# Patient Record
Sex: Female | Born: 1945 | Race: White | Hispanic: No | Marital: Married | Smoking: Never smoker
Health system: Southern US, Community
[De-identification: ages and names within clinical notes are randomized; demographics above are authoritative.]

## PROBLEM LIST (undated history)

## (undated) DIAGNOSIS — Z8041 Family history of malignant neoplasm of ovary: Secondary | ICD-10-CM

## (undated) DIAGNOSIS — M5136 Other intervertebral disc degeneration, lumbar region: Secondary | ICD-10-CM

## (undated) DIAGNOSIS — D649 Anemia, unspecified: Secondary | ICD-10-CM

## (undated) DIAGNOSIS — E119 Type 2 diabetes mellitus without complications: Secondary | ICD-10-CM

## (undated) DIAGNOSIS — L0390 Cellulitis, unspecified: Secondary | ICD-10-CM

## (undated) DIAGNOSIS — Z800 Family history of malignant neoplasm of digestive organs: Secondary | ICD-10-CM

## (undated) DIAGNOSIS — M1990 Unspecified osteoarthritis, unspecified site: Secondary | ICD-10-CM

## (undated) DIAGNOSIS — G629 Polyneuropathy, unspecified: Secondary | ICD-10-CM

## (undated) DIAGNOSIS — C801 Malignant (primary) neoplasm, unspecified: Secondary | ICD-10-CM

## (undated) DIAGNOSIS — K529 Noninfective gastroenteritis and colitis, unspecified: Secondary | ICD-10-CM

## (undated) DIAGNOSIS — K219 Gastro-esophageal reflux disease without esophagitis: Secondary | ICD-10-CM

## (undated) DIAGNOSIS — G4733 Obstructive sleep apnea (adult) (pediatric): Secondary | ICD-10-CM

## (undated) DIAGNOSIS — I872 Venous insufficiency (chronic) (peripheral): Secondary | ICD-10-CM

## (undated) DIAGNOSIS — Z9981 Dependence on supplemental oxygen: Secondary | ICD-10-CM

## (undated) DIAGNOSIS — I10 Essential (primary) hypertension: Secondary | ICD-10-CM

## (undated) DIAGNOSIS — Z803 Family history of malignant neoplasm of breast: Secondary | ICD-10-CM

## (undated) DIAGNOSIS — I5030 Unspecified diastolic (congestive) heart failure: Secondary | ICD-10-CM

## (undated) DIAGNOSIS — R0602 Shortness of breath: Secondary | ICD-10-CM

## (undated) DIAGNOSIS — H9190 Unspecified hearing loss, unspecified ear: Secondary | ICD-10-CM

## (undated) DIAGNOSIS — M51369 Other intervertebral disc degeneration, lumbar region without mention of lumbar back pain or lower extremity pain: Secondary | ICD-10-CM

## (undated) DIAGNOSIS — G2581 Restless legs syndrome: Secondary | ICD-10-CM

## (undated) DIAGNOSIS — E785 Hyperlipidemia, unspecified: Secondary | ICD-10-CM

## (undated) HISTORY — DX: Venous insufficiency (chronic) (peripheral): I87.2

## (undated) HISTORY — DX: Hyperlipidemia, unspecified: E78.5

## (undated) HISTORY — DX: Family history of malignant neoplasm of ovary: Z80.41

## (undated) HISTORY — DX: Unspecified osteoarthritis, unspecified site: M19.90

## (undated) HISTORY — DX: Family history of malignant neoplasm of breast: Z80.3

## (undated) HISTORY — DX: Unspecified diastolic (congestive) heart failure: I50.30

## (undated) HISTORY — DX: Restless legs syndrome: G25.81

## (undated) HISTORY — DX: Type 2 diabetes mellitus without complications: E11.9

## (undated) HISTORY — DX: Essential (primary) hypertension: I10

## (undated) HISTORY — DX: Obstructive sleep apnea (adult) (pediatric): G47.33

## (undated) HISTORY — DX: Family history of malignant neoplasm of digestive organs: Z80.0

## (undated) HISTORY — DX: Noninfective gastroenteritis and colitis, unspecified: K52.9

---

## 1968-02-22 HISTORY — PX: TONSILLECTOMY: SUR1361

## 1978-02-21 HISTORY — PX: TUBAL LIGATION: SHX77

## 1993-01-20 ENCOUNTER — Encounter: Payer: Self-pay | Admitting: Internal Medicine

## 1993-02-21 HISTORY — PX: UMBILICAL HERNIA REPAIR: SHX196

## 1995-02-22 HISTORY — PX: CHOLECYSTECTOMY: SHX55

## 1997-09-18 ENCOUNTER — Other Ambulatory Visit: Admission: RE | Admit: 1997-09-18 | Discharge: 1997-09-18 | Payer: Self-pay | Admitting: Internal Medicine

## 2001-12-18 ENCOUNTER — Encounter: Payer: Self-pay | Admitting: Internal Medicine

## 2002-11-11 ENCOUNTER — Other Ambulatory Visit: Admission: RE | Admit: 2002-11-11 | Discharge: 2002-11-11 | Payer: Self-pay | Admitting: Family Medicine

## 2002-11-13 ENCOUNTER — Encounter: Admission: RE | Admit: 2002-11-13 | Discharge: 2002-11-13 | Payer: Self-pay

## 2002-11-20 ENCOUNTER — Encounter: Admission: RE | Admit: 2002-11-20 | Discharge: 2002-11-20 | Payer: Self-pay

## 2003-01-06 ENCOUNTER — Encounter: Admission: RE | Admit: 2003-01-06 | Discharge: 2003-01-06 | Payer: Self-pay

## 2003-01-06 ENCOUNTER — Encounter: Payer: Self-pay | Admitting: Internal Medicine

## 2003-01-18 ENCOUNTER — Emergency Department (HOSPITAL_COMMUNITY): Admission: AD | Admit: 2003-01-18 | Discharge: 2003-01-18 | Payer: Self-pay | Admitting: Family Medicine

## 2003-11-14 ENCOUNTER — Encounter: Payer: Self-pay | Admitting: Internal Medicine

## 2003-11-20 ENCOUNTER — Other Ambulatory Visit: Admission: RE | Admit: 2003-11-20 | Discharge: 2003-11-20 | Payer: Self-pay | Admitting: Family Medicine

## 2003-12-18 ENCOUNTER — Encounter: Payer: Self-pay | Admitting: Internal Medicine

## 2004-01-03 ENCOUNTER — Emergency Department (HOSPITAL_COMMUNITY): Admission: EM | Admit: 2004-01-03 | Discharge: 2004-01-03 | Payer: Self-pay | Admitting: Family Medicine

## 2004-01-08 ENCOUNTER — Encounter: Admission: RE | Admit: 2004-01-08 | Discharge: 2004-01-08 | Payer: Self-pay | Admitting: Family Medicine

## 2004-04-12 ENCOUNTER — Emergency Department (HOSPITAL_COMMUNITY): Admission: EM | Admit: 2004-04-12 | Discharge: 2004-04-12 | Payer: Self-pay | Admitting: Family Medicine

## 2004-09-10 ENCOUNTER — Encounter: Admission: RE | Admit: 2004-09-10 | Discharge: 2004-09-10 | Payer: Self-pay | Admitting: Family Medicine

## 2004-12-02 ENCOUNTER — Other Ambulatory Visit: Admission: RE | Admit: 2004-12-02 | Discharge: 2004-12-02 | Payer: Self-pay | Admitting: Family Medicine

## 2005-03-18 ENCOUNTER — Encounter: Admission: RE | Admit: 2005-03-18 | Discharge: 2005-03-18 | Payer: Self-pay | Admitting: Family Medicine

## 2006-01-30 ENCOUNTER — Encounter: Admission: RE | Admit: 2006-01-30 | Discharge: 2006-01-30 | Payer: Self-pay | Admitting: Family Medicine

## 2006-02-21 HISTORY — PX: OTHER SURGICAL HISTORY: SHX169

## 2006-03-06 ENCOUNTER — Encounter (INDEPENDENT_AMBULATORY_CARE_PROVIDER_SITE_OTHER): Payer: Self-pay | Admitting: Specialist

## 2006-03-06 ENCOUNTER — Ambulatory Visit (HOSPITAL_COMMUNITY): Admission: RE | Admit: 2006-03-06 | Discharge: 2006-03-06 | Payer: Self-pay | Admitting: *Deleted

## 2006-07-19 ENCOUNTER — Encounter: Admission: RE | Admit: 2006-07-19 | Discharge: 2006-07-19 | Payer: Self-pay | Admitting: Family Medicine

## 2007-03-12 ENCOUNTER — Encounter: Payer: Self-pay | Admitting: Internal Medicine

## 2007-03-27 ENCOUNTER — Encounter: Admission: RE | Admit: 2007-03-27 | Discharge: 2007-03-27 | Payer: Self-pay | Admitting: Family Medicine

## 2007-07-23 ENCOUNTER — Encounter: Admission: RE | Admit: 2007-07-23 | Discharge: 2007-07-23 | Payer: Self-pay | Admitting: Obstetrics & Gynecology

## 2007-07-23 LAB — HM MAMMOGRAPHY: HM Mammogram: NEGATIVE

## 2008-01-13 ENCOUNTER — Inpatient Hospital Stay (HOSPITAL_COMMUNITY): Admission: EM | Admit: 2008-01-13 | Discharge: 2008-01-20 | Payer: Self-pay | Admitting: Family Medicine

## 2008-01-14 ENCOUNTER — Ambulatory Visit: Payer: Self-pay | Admitting: Gastroenterology

## 2008-01-16 ENCOUNTER — Encounter: Payer: Self-pay | Admitting: Gastroenterology

## 2008-02-22 LAB — CONVERTED CEMR LAB: Pap Smear: NEGATIVE

## 2008-02-29 ENCOUNTER — Encounter: Payer: Self-pay | Admitting: Internal Medicine

## 2008-02-29 LAB — CONVERTED CEMR LAB
AST: 24 units/L
Basophils Relative: 0.8 %
CO2: 28 meq/L
Calcium: 6.5 mg/dL
Cholesterol: 308 mg/dL
Creatinine, Ser: 0.5 mg/dL
HCT: 40.8 %
Hemoglobin: 13.4 g/dL
Lymphocytes, automated: 35.8 %
MCV: 81.9 fL
Monocytes Relative: 5.5 %
Platelets: 327 10*3/uL
Potassium: 4.9 meq/L
Sodium: 142 meq/L
WBC: 8 10*3/uL

## 2008-05-21 ENCOUNTER — Encounter: Payer: Self-pay | Admitting: Internal Medicine

## 2008-05-21 LAB — CONVERTED CEMR LAB
AST: 21 units/L
Alkaline Phosphatase: 86 units/L
HDL: 60 mg/dL
Triglyceride fasting, serum: 301 mg/dL

## 2008-05-22 ENCOUNTER — Encounter: Payer: Self-pay | Admitting: Internal Medicine

## 2008-05-26 ENCOUNTER — Other Ambulatory Visit: Admission: RE | Admit: 2008-05-26 | Discharge: 2008-05-26 | Payer: Self-pay | Admitting: Obstetrics & Gynecology

## 2008-08-15 ENCOUNTER — Encounter: Payer: Self-pay | Admitting: Internal Medicine

## 2008-08-15 LAB — CONVERTED CEMR LAB
ALT: 21 units/L
AST: 27 units/L
Alkaline Phosphatase: 68 units/L
Calcium: 9.4 mg/dL
Cholesterol: 244 mg/dL
Glucose, Bld: 106 mg/dL
HDL: 62 mg/dL
LDL Cholesterol: 127 mg/dL
Sodium: 141 meq/L
Total Bilirubin: 0.7 mg/dL
Triglyceride fasting, serum: 233 mg/dL

## 2008-08-21 ENCOUNTER — Encounter: Payer: Self-pay | Admitting: Internal Medicine

## 2008-09-16 ENCOUNTER — Encounter: Payer: Self-pay | Admitting: Internal Medicine

## 2008-09-24 ENCOUNTER — Encounter: Payer: Self-pay | Admitting: Internal Medicine

## 2008-11-24 ENCOUNTER — Encounter: Payer: Self-pay | Admitting: Internal Medicine

## 2008-11-24 LAB — CONVERTED CEMR LAB: Hgb A1c MFr Bld: 6.8 %

## 2009-02-24 ENCOUNTER — Encounter: Payer: Self-pay | Admitting: Internal Medicine

## 2009-02-24 LAB — CONVERTED CEMR LAB
BUN: 19 mg/dL
Chloride: 98 meq/L
Potassium: 4 meq/L
Sodium: 138 meq/L

## 2009-03-11 ENCOUNTER — Encounter: Admission: RE | Admit: 2009-03-11 | Discharge: 2009-06-05 | Payer: Self-pay | Admitting: Family Medicine

## 2009-04-15 DIAGNOSIS — K2980 Duodenitis without bleeding: Secondary | ICD-10-CM | POA: Insufficient documentation

## 2009-04-16 ENCOUNTER — Encounter: Payer: Self-pay | Admitting: Internal Medicine

## 2009-04-20 ENCOUNTER — Ambulatory Visit: Payer: Self-pay | Admitting: Internal Medicine

## 2009-04-20 DIAGNOSIS — R0602 Shortness of breath: Secondary | ICD-10-CM | POA: Insufficient documentation

## 2009-04-20 DIAGNOSIS — I10 Essential (primary) hypertension: Secondary | ICD-10-CM

## 2009-04-20 DIAGNOSIS — M545 Low back pain: Secondary | ICD-10-CM | POA: Insufficient documentation

## 2009-04-20 DIAGNOSIS — E785 Hyperlipidemia, unspecified: Secondary | ICD-10-CM

## 2009-04-20 DIAGNOSIS — G2581 Restless legs syndrome: Secondary | ICD-10-CM | POA: Insufficient documentation

## 2009-04-20 DIAGNOSIS — G629 Polyneuropathy, unspecified: Secondary | ICD-10-CM

## 2009-04-20 DIAGNOSIS — M129 Arthropathy, unspecified: Secondary | ICD-10-CM | POA: Insufficient documentation

## 2009-04-21 ENCOUNTER — Encounter (INDEPENDENT_AMBULATORY_CARE_PROVIDER_SITE_OTHER): Payer: Self-pay | Admitting: *Deleted

## 2009-04-28 ENCOUNTER — Encounter: Payer: Self-pay | Admitting: Internal Medicine

## 2009-04-29 ENCOUNTER — Ambulatory Visit: Payer: Self-pay | Admitting: Pulmonary Disease

## 2009-04-29 DIAGNOSIS — G4730 Sleep apnea, unspecified: Secondary | ICD-10-CM | POA: Insufficient documentation

## 2009-04-30 ENCOUNTER — Telehealth (INDEPENDENT_AMBULATORY_CARE_PROVIDER_SITE_OTHER): Payer: Self-pay | Admitting: *Deleted

## 2009-05-01 LAB — CONVERTED CEMR LAB
ALT: 21 units/L (ref 0–35)
CO2: 31 meq/L (ref 19–32)
Chloride: 108 meq/L (ref 96–112)
Eosinophils Relative: 1.4 % (ref 0.0–5.0)
GFR calc non Af Amer: 107.03 mL/min (ref >60)
Monocytes Absolute: 0.5 10*3/uL (ref 0.1–1.0)
Monocytes Relative: 5.9 % (ref 3.0–12.0)
Neutrophils Relative %: 62.7 % (ref 43.0–77.0)
Platelets: 283 10*3/uL (ref 150.0–400.0)
Pro B Natriuretic peptide (BNP): 23 pg/mL (ref 0.0–100.0)
Sodium: 144 meq/L (ref 135–145)
TSH: 1.16 microintl units/mL (ref 0.35–5.50)
Total Bilirubin: 0.4 mg/dL (ref 0.3–1.2)
Total Protein: 6.5 g/dL (ref 6.0–8.3)
WBC: 8.8 10*3/uL (ref 4.5–10.5)

## 2009-05-04 ENCOUNTER — Telehealth: Payer: Self-pay | Admitting: Pulmonary Disease

## 2009-05-04 ENCOUNTER — Telehealth (INDEPENDENT_AMBULATORY_CARE_PROVIDER_SITE_OTHER): Payer: Self-pay | Admitting: *Deleted

## 2009-05-05 ENCOUNTER — Ambulatory Visit (HOSPITAL_COMMUNITY): Admission: RE | Admit: 2009-05-05 | Discharge: 2009-05-05 | Payer: Self-pay | Admitting: Internal Medicine

## 2009-05-05 ENCOUNTER — Encounter (INDEPENDENT_AMBULATORY_CARE_PROVIDER_SITE_OTHER): Payer: Self-pay | Admitting: Internal Medicine

## 2009-05-05 ENCOUNTER — Ambulatory Visit: Payer: Self-pay | Admitting: Cardiology

## 2009-05-05 ENCOUNTER — Ambulatory Visit: Payer: Self-pay

## 2009-05-05 ENCOUNTER — Telehealth: Payer: Self-pay | Admitting: Internal Medicine

## 2009-05-12 ENCOUNTER — Encounter: Payer: Self-pay | Admitting: Internal Medicine

## 2009-05-12 ENCOUNTER — Telehealth: Payer: Self-pay | Admitting: Internal Medicine

## 2009-05-12 ENCOUNTER — Encounter: Admission: RE | Admit: 2009-05-12 | Discharge: 2009-05-12 | Payer: Self-pay | Admitting: Internal Medicine

## 2009-05-12 DIAGNOSIS — E559 Vitamin D deficiency, unspecified: Secondary | ICD-10-CM

## 2009-05-17 ENCOUNTER — Ambulatory Visit (HOSPITAL_BASED_OUTPATIENT_CLINIC_OR_DEPARTMENT_OTHER): Admission: RE | Admit: 2009-05-17 | Discharge: 2009-05-17 | Payer: Self-pay | Admitting: Pulmonary Disease

## 2009-05-17 ENCOUNTER — Encounter: Payer: Self-pay | Admitting: Pulmonary Disease

## 2009-05-18 ENCOUNTER — Ambulatory Visit: Payer: Self-pay | Admitting: Internal Medicine

## 2009-05-19 ENCOUNTER — Ambulatory Visit: Payer: Self-pay | Admitting: Pulmonary Disease

## 2009-05-19 DIAGNOSIS — I519 Heart disease, unspecified: Secondary | ICD-10-CM

## 2009-05-21 ENCOUNTER — Ambulatory Visit: Payer: Self-pay | Admitting: Pulmonary Disease

## 2009-05-22 ENCOUNTER — Ambulatory Visit: Payer: Self-pay | Admitting: Internal Medicine

## 2009-05-22 LAB — CONVERTED CEMR LAB: IgA: 247 mg/dL (ref 68–378)

## 2009-05-28 ENCOUNTER — Ambulatory Visit: Payer: Self-pay | Admitting: Internal Medicine

## 2009-05-29 ENCOUNTER — Ambulatory Visit: Payer: Self-pay | Admitting: Internal Medicine

## 2009-05-29 ENCOUNTER — Telehealth: Payer: Self-pay | Admitting: Internal Medicine

## 2009-05-29 LAB — CONVERTED CEMR LAB
Tissue Transglutaminase Ab, IgA: 0.6 units (ref <7)
Total CHOL/HDL Ratio: 5
Triglycerides: 539 mg/dL — ABNORMAL HIGH (ref 0.0–149.0)

## 2009-06-01 ENCOUNTER — Ambulatory Visit: Payer: Self-pay | Admitting: Cardiology

## 2009-06-01 ENCOUNTER — Telehealth (INDEPENDENT_AMBULATORY_CARE_PROVIDER_SITE_OTHER): Payer: Self-pay | Admitting: *Deleted

## 2009-06-01 ENCOUNTER — Telehealth: Payer: Self-pay | Admitting: Internal Medicine

## 2009-06-01 DIAGNOSIS — R0989 Other specified symptoms and signs involving the circulatory and respiratory systems: Secondary | ICD-10-CM | POA: Insufficient documentation

## 2009-06-01 DIAGNOSIS — R0609 Other forms of dyspnea: Secondary | ICD-10-CM

## 2009-06-01 DIAGNOSIS — I5032 Chronic diastolic (congestive) heart failure: Secondary | ICD-10-CM

## 2009-06-01 LAB — CONVERTED CEMR LAB
BUN: 19 mg/dL (ref 6–23)
Chloride: 103 meq/L (ref 96–112)
Creatinine, Ser: 0.5 mg/dL (ref 0.4–1.2)
GFR calc non Af Amer: 132.06 mL/min (ref >60)
Pro B Natriuretic peptide (BNP): 14 pg/mL (ref 0.0–100.0)
TSH: 1.35 microintl units/mL (ref 0.35–5.50)

## 2009-06-03 ENCOUNTER — Telehealth (INDEPENDENT_AMBULATORY_CARE_PROVIDER_SITE_OTHER): Payer: Self-pay | Admitting: *Deleted

## 2009-06-04 ENCOUNTER — Encounter: Payer: Self-pay | Admitting: Cardiovascular Disease

## 2009-06-04 ENCOUNTER — Ambulatory Visit: Payer: Self-pay

## 2009-06-04 ENCOUNTER — Encounter (HOSPITAL_COMMUNITY): Admission: RE | Admit: 2009-06-04 | Discharge: 2009-07-23 | Payer: Self-pay | Admitting: Internal Medicine

## 2009-06-04 ENCOUNTER — Ambulatory Visit: Payer: Self-pay | Admitting: Cardiology

## 2009-06-05 ENCOUNTER — Telehealth (INDEPENDENT_AMBULATORY_CARE_PROVIDER_SITE_OTHER): Payer: Self-pay | Admitting: *Deleted

## 2009-06-05 ENCOUNTER — Encounter: Payer: Self-pay | Admitting: Cardiology

## 2009-06-15 ENCOUNTER — Ambulatory Visit: Payer: Self-pay | Admitting: Cardiology

## 2009-06-15 ENCOUNTER — Ambulatory Visit: Payer: Self-pay | Admitting: Internal Medicine

## 2009-06-16 ENCOUNTER — Inpatient Hospital Stay (HOSPITAL_BASED_OUTPATIENT_CLINIC_OR_DEPARTMENT_OTHER): Admission: RE | Admit: 2009-06-16 | Discharge: 2009-06-16 | Payer: Self-pay | Admitting: Cardiology

## 2009-06-16 ENCOUNTER — Ambulatory Visit: Payer: Self-pay | Admitting: Cardiology

## 2009-06-16 LAB — CONVERTED CEMR LAB
Basophils Absolute: 0.1 10*3/uL (ref 0.0–0.1)
Basophils Relative: 1 % (ref 0.0–3.0)
CO2: 32 meq/L (ref 19–32)
Chloride: 99 meq/L (ref 96–112)
Eosinophils Absolute: 0.1 10*3/uL (ref 0.0–0.7)
Glucose, Bld: 148 mg/dL — ABNORMAL HIGH (ref 70–99)
HCT: 37.4 % (ref 36.0–46.0)
Hemoglobin: 12.6 g/dL (ref 12.0–15.0)
Lymphocytes Relative: 38.3 % (ref 12.0–46.0)
Lymphs Abs: 3.3 10*3/uL (ref 0.7–4.0)
MCHC: 33.7 g/dL (ref 30.0–36.0)
MCV: 83.4 fL (ref 78.0–100.0)
Monocytes Absolute: 0.6 10*3/uL (ref 0.1–1.0)
Neutro Abs: 4.5 10*3/uL (ref 1.4–7.7)
Potassium: 5.2 meq/L — ABNORMAL HIGH (ref 3.5–5.1)
RBC: 4.49 M/uL (ref 3.87–5.11)
RDW: 15.9 % — ABNORMAL HIGH (ref 11.5–14.6)
Sodium: 142 meq/L (ref 135–145)

## 2009-06-22 ENCOUNTER — Ambulatory Visit: Payer: Self-pay | Admitting: Cardiology

## 2009-06-25 LAB — CONVERTED CEMR LAB
BUN: 37 mg/dL — ABNORMAL HIGH (ref 6–23)
CO2: 31 meq/L (ref 19–32)
Calcium: 9.7 mg/dL (ref 8.4–10.5)
Glucose, Bld: 109 mg/dL — ABNORMAL HIGH (ref 70–99)
Sodium: 142 meq/L (ref 135–145)

## 2009-07-06 ENCOUNTER — Ambulatory Visit: Payer: Self-pay | Admitting: Endocrinology

## 2009-07-06 DIAGNOSIS — N259 Disorder resulting from impaired renal tubular function, unspecified: Secondary | ICD-10-CM

## 2009-07-06 LAB — CONVERTED CEMR LAB: Calcium, Total (PTH): 10.4 mg/dL (ref 8.4–10.5)

## 2009-07-08 ENCOUNTER — Telehealth: Payer: Self-pay | Admitting: Internal Medicine

## 2009-07-10 ENCOUNTER — Telehealth: Payer: Self-pay | Admitting: Endocrinology

## 2009-07-10 ENCOUNTER — Ambulatory Visit: Payer: Self-pay | Admitting: Endocrinology

## 2009-07-14 ENCOUNTER — Telehealth: Payer: Self-pay | Admitting: Endocrinology

## 2009-07-27 ENCOUNTER — Telehealth: Payer: Self-pay | Admitting: Endocrinology

## 2009-08-03 ENCOUNTER — Ambulatory Visit: Payer: Self-pay | Admitting: Internal Medicine

## 2009-08-03 DIAGNOSIS — G4700 Insomnia, unspecified: Secondary | ICD-10-CM | POA: Insufficient documentation

## 2009-08-04 ENCOUNTER — Telehealth: Payer: Self-pay | Admitting: Internal Medicine

## 2009-08-10 ENCOUNTER — Ambulatory Visit: Payer: Self-pay | Admitting: Endocrinology

## 2009-08-10 ENCOUNTER — Encounter: Payer: Self-pay | Admitting: Internal Medicine

## 2009-10-29 ENCOUNTER — Ambulatory Visit: Payer: Self-pay | Admitting: Cardiology

## 2009-11-02 ENCOUNTER — Ambulatory Visit: Payer: Self-pay | Admitting: Internal Medicine

## 2009-11-09 ENCOUNTER — Ambulatory Visit: Payer: Self-pay | Admitting: Internal Medicine

## 2009-11-09 ENCOUNTER — Ambulatory Visit: Payer: Self-pay | Admitting: Cardiology

## 2009-11-09 LAB — CONVERTED CEMR LAB: Hgb A1c MFr Bld: 7.7 % — ABNORMAL HIGH (ref 4.6–6.5)

## 2009-11-12 ENCOUNTER — Ambulatory Visit: Payer: Self-pay | Admitting: Cardiology

## 2009-11-12 LAB — CONVERTED CEMR LAB
Albumin: 3.3 g/dL — ABNORMAL LOW (ref 3.5–5.2)
BUN: 36 mg/dL — ABNORMAL HIGH (ref 6–23)
CO2: 28 meq/L (ref 19–32)
Chloride: 98 meq/L (ref 96–112)
Cholesterol: 251 mg/dL — ABNORMAL HIGH (ref 0–200)
Direct LDL: 144.8 mg/dL
Potassium: 4 meq/L (ref 3.5–5.1)
Total Bilirubin: 0.4 mg/dL (ref 0.3–1.2)
Total CHOL/HDL Ratio: 4
Triglycerides: 216 mg/dL — ABNORMAL HIGH (ref 0.0–149.0)
VLDL: 43.2 mg/dL — ABNORMAL HIGH (ref 0.0–40.0)

## 2009-11-16 ENCOUNTER — Telehealth: Payer: Self-pay | Admitting: Cardiology

## 2009-11-27 ENCOUNTER — Encounter: Admission: RE | Admit: 2009-11-27 | Discharge: 2009-11-27 | Payer: Self-pay | Admitting: Obstetrics & Gynecology

## 2009-11-30 ENCOUNTER — Encounter: Admission: RE | Admit: 2009-11-30 | Discharge: 2009-11-30 | Payer: Self-pay | Admitting: Obstetrics & Gynecology

## 2009-12-02 ENCOUNTER — Encounter: Payer: Self-pay | Admitting: Internal Medicine

## 2009-12-02 LAB — CONVERTED CEMR LAB
Albumin: 3.8 g/dL
BUN: 29 mg/dL
Basophils Relative: 0 %
Calcium: 9.5 mg/dL
Cholesterol: 236 mg/dL
Creatinine, Ser: 0.81 mg/dL
Glucose, Bld: 76 mg/dL
HCT: 33.6 %
Hemoglobin: 11.1 g/dL
LDL Cholesterol: 115 mg/dL
Platelets: 287 10*3/uL
RBC: 4.03 M/uL
Sodium: 143 meq/L
Total Bilirubin: 0.3 mg/dL
Total Protein: 6.6 g/dL
WBC: 7.5 10*3/uL

## 2009-12-03 ENCOUNTER — Telehealth: Payer: Self-pay | Admitting: Internal Medicine

## 2009-12-07 ENCOUNTER — Encounter (INDEPENDENT_AMBULATORY_CARE_PROVIDER_SITE_OTHER): Payer: Self-pay | Admitting: *Deleted

## 2010-02-01 ENCOUNTER — Ambulatory Visit: Payer: Self-pay | Admitting: Internal Medicine

## 2010-02-02 ENCOUNTER — Encounter: Payer: Self-pay | Admitting: Internal Medicine

## 2010-02-23 ENCOUNTER — Telehealth: Payer: Self-pay | Admitting: Internal Medicine

## 2010-03-14 ENCOUNTER — Encounter: Payer: Self-pay | Admitting: Internal Medicine

## 2010-03-21 LAB — CONVERTED CEMR LAB
CO2: 29 meq/L (ref 19–32)
Chloride: 97 meq/L (ref 96–112)
Potassium: 4.3 meq/L (ref 3.5–5.1)

## 2010-03-24 ENCOUNTER — Ambulatory Visit: Admit: 2010-03-24 | Payer: Self-pay | Admitting: Cardiology

## 2010-03-24 ENCOUNTER — Encounter: Payer: Self-pay | Admitting: Cardiology

## 2010-03-24 ENCOUNTER — Ambulatory Visit (INDEPENDENT_AMBULATORY_CARE_PROVIDER_SITE_OTHER): Payer: BC Managed Care – PPO | Admitting: Cardiology

## 2010-03-24 DIAGNOSIS — I5032 Chronic diastolic (congestive) heart failure: Secondary | ICD-10-CM

## 2010-03-25 ENCOUNTER — Other Ambulatory Visit: Payer: BC Managed Care – PPO

## 2010-03-25 ENCOUNTER — Encounter (INDEPENDENT_AMBULATORY_CARE_PROVIDER_SITE_OTHER): Payer: Self-pay | Admitting: *Deleted

## 2010-03-25 ENCOUNTER — Other Ambulatory Visit: Payer: Self-pay | Admitting: Cardiology

## 2010-03-25 DIAGNOSIS — I5032 Chronic diastolic (congestive) heart failure: Secondary | ICD-10-CM

## 2010-03-25 LAB — BASIC METABOLIC PANEL
CO2: 29 mEq/L (ref 19–32)
Calcium: 8.9 mg/dL (ref 8.4–10.5)
Glucose, Bld: 196 mg/dL — ABNORMAL HIGH (ref 70–99)
Sodium: 136 mEq/L (ref 135–145)

## 2010-03-25 NOTE — Progress Notes (Signed)
Summary: Rx req  Phone Note Call from Patient Call back at Home Phone [phone]   Caller: Patient Summary of Call: pt called requesting Test Strips for Freestyle Lite Initial call taken by: Margaret Pyle, CMA,  July 27, 2009 9:32 AM    New/Updated Medications: FREESTYLE LITE TEST  STRP (GLUCOSE BLOOD) use as directed three times a day Prescriptions: FREESTYLE LITE TEST  STRP (GLUCOSE BLOOD) use as directed three times a day  #100 x 11   Entered by:   Margaret Pyle, CMA   Authorized by:   Minus Breeding MD   Signed by:   Margaret Pyle, CMA on 07/27/2009   Method used:   Electronically to        [facility] [address] [address] (retail)       [address]       [place], [place]  [address]       Ph: [phone] or [phone]       Fax: [phone]   RxID:   [id number]

## 2010-03-25 NOTE — Progress Notes (Signed)
Summary: HUMALOG   Phone Note From Pharmacy   Summary of Call: Pharm recieved rx for humalog pen, per pharm rx in future needs to be Humalog KWIK pen. FYI, for you, Ok to update in EMR?  Initial call taken by: Lamar Sprinkles, CMA,  Jul 10, 2009 3:04 PM  Follow-up for Phone Call        please change, thank you Follow-up by: Minus Breeding MD,  Jul 10, 2009 3:07 PM    New/Updated Medications: HUMALOG KWIKPEN 100 UNIT/ML SOLN (INSULIN LISPRO (HUMAN)) 5 units three times a day (just before each meal), and pen needles three times a day

## 2010-03-25 NOTE — Assessment & Plan Note (Signed)
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 Yes   Referring Provider:  Newt Lukes MD Primary Provider:  Newt Lukes MD  CC:  New Endo: Diabetes/ pt states she is no longer taking Vitamin E, B-12, and D or multivitamin/CF.  History of Present Illness: pt states 5 years h/o dm.   it is complicated by chf, neuropathy, and renal insufficiency.  she has never been on insulin.  she takes glyburide and metformin.  she does not check cbg's recently. pt says her diet and exercise are good.   symptomatically, pt states few years of pain in hte abdomen, and associated nausea.   Current Medications (verified): 1)  Aspirin 81 Mg Tabs (Aspirin) .... Once Daily 2)  Vitamin D 1000 Unit Tabs (Cholecalciferol) .... Take 1 Tablet By Mouth A Day 3)  Glyburide 5 Mg Tabs (Glyburide) .... Take 1 By Mouth Qd 4)  Diovan 160 Mg Tabs (Valsartan) .[name].. 1 By Mouth Once Daily 5)  Requip 4 Mg Tabs (Ropinirole Hcl) .... Take 1 By Mouth Qd 6)  Calcium-Vitamin D [phone] Mg-Unit Tabs (Calcium-Vitamin D) .... Once Daily 7)  Vitamin E 1000 Unit Caps (Vitamin E) .... Once Daily 8)  Vitamin B-12 1000 Mcg Tabs (Cyanocobalamin) .... Once Daily 9)  Coq10 30 Mg Caps (Coenzyme Q10) .... Once Daily 10)  Ra Omega 3-6-9  Caps (Misc Natural Products) .... Once Daily 11)  Niaspan 1000 Mg Cr-Tabs (Niacin (Antihyperlipidemic)) .... One Tablet At Bedtime 12)  Metformin Hcl 500 Mg Tabs (Metformin Hcl) .... Take 1 Tablet By Mouth Two  Times A Day 13)  Coreg 12.5 Mg Tabs (Carvedilol) .... One Tablet Twice A Day 14)  Lovaza 1 Gm Caps (Omega-3-Acid Ethyl Esters) .... Two Twice Daily 15)  Fenofibrate 160 Mg Tabs (Fenofibrate) .... Take 1 By Mouth Qd 16)  Lasix 40 Mg Tabs (Furosemide) .... Take 1 Two Times A Day  Allergies (verified): 1)  ! Sulfa 2)  Morphine  Family History: Reviewed history from 06/01/2009 and no changes required. Family History of Alcoholism/Addiction (parent) Family History Diabetes 1st degree relative (grandparent) Family History High cholesterol (parent, grandparent) Family History Hypertension (parent, grandparent) Family History Lung cancer (grandparent) Stomach cancer (grandmother) Celiac Sprue  daughter Mother with MI at 98, father with MI at 49, uncle with MI at 17, brother with stents in his 73s, multiple aunts with MIs and CVAs  Social History: Reviewed history from 05/22/2009 and no changes required. 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  sob is imporoved.  she has chronic diarrhea.  she has  intermittent muscle cramps. she has lost weight (she says due to diuresis).  Physical Exam  General:  morbidly obese.  no distress  Head:  head: no deformity eyes: no periorbital swelling, no proptosis external nose and ears are normal mouth: no lesion seen Neck:  Supple without thyroid enlargement or tenderness.  Lungs:  Clear to auscultation bilaterally. Normal respiratory effort.  Heart:  Regular rate and rhythm without gallops noted.  there is a soft systolic murmur. Normal S1,S2.   Abdomen:  abdomen is soft, nontender.  no hepatosplenomegaly.   not distended.  no hernia  Msk:  muscle bulk and strength are grossly  normal.  no obvious joint swelling.  gait is normal and steady  Pulses:  dorsalis pedis intact bilat.  no carotid bruit  Extremities:  no deformity.  no ulcer on the feet.  feet are of normal color and temp.  no edema  Neurologic:  cn 2-12 grossly intact.   readily moves all 4's.   sensation is intact to touch on the feet  Skin:  normal texture and temp.  no rash.  not diaphoretic  Cervical Nodes:  No significant adenopathy.  Psych:  Alert and cooperative; normal mood and affect; normal attention span and concentration.     Impression & Recommendations:  Problem # 1:  DIABETES MELLITUS, TYPE II (ICD-250.00) uncertain control  Problem # 2:  RENAL INSUFFICIENCY (ICD-588.9) this degree is a relative contrindication to metformin  Problem # 3:  CHRONIC DIASTOLIC HEART FAILURE (ICD-428.32) this is a relative contrindication to actos  Problem # 4:  depression this complicates the rx of dm  Medications Added to Medication List This Visit: 1)  Metformin Hcl 500 Mg Xr24h-tab (Metformin hcl) .[name].. 1 tab two times a day  Other Orders: TLB-A1C / Hgb A1C (Glycohemoglobin) (83036-A1C) T-Parathyroid Hormone, Intact w/ Calcium (57846-96295) Consultation Level IV (28413)  Patient Instructions: 1)  good diet and exercise habits significanly improve the control of your diabetes.  please let me know if you wish to be referred to a dietician.  high blood sugar is very risky to your health.  you should see an eye doctor every year. 2)  controlling your blood pressure and cholesterol drastically reduces the damage diabetes does to your body.  this also applies to quitting smoking.  please discuss these with your doctor.  you should take an aspirin every day, unless you have been advised by a doctor not to. 3)  we will need to take this complex situation in stages 4)  check your blood sugar 1 time a day.  vary the time of day when you check, between before the 3 meals, and at bedtime.  also check if  you have symptoms of your blood sugar being too high or too low.  please keep a record of the readings and bring it to your next appointment here.  please call us sooner if you are having low blood sugar episodes. 5)  tests are being ordered for you today.  a few days after the test(s), please call [phone] to hear your test results. 6)  pending the test results, please change metformin to -xr 500 mg two times a day. 7)  Please schedule a follow-up appointment in 1 month. Prescriptions: METFORMIN HCL 500 MG XR24H-TAB (METFORMIN HCL) 1 tab two times a day  #60 x 11   Entered and Authorized by:   Minus Breeding MD   Signed by:   Minus Breeding MD on 07/06/2009  Method used:   Electronically to        [facility] (retail)       [address]       [place], [place]  [address]       Ph: [phone] or [phone]       Fax: [phone]   RxID:   [id number]

## 2010-03-25 NOTE — Assessment & Plan Note (Signed)
Summary: 3 MTH FU--STC   Vital Signs:  Patient profile:   65 year old female Height:      61 inches (154.94 cm) Weight:      228.8 pounds (104 kg) O2 Sat:      96 % on Room air Temp:     97.3 degrees F (36.28 degrees C) oral Pulse rate:   66 / minute BP sitting:   120 / 62  (left arm) Cuff size:   regular  Vitals Entered By: Orlan Leavens RMA (February 01, 2010 11:20 AM)  O2 Flow:  Room air CC: 3 month follow-up Is Patient Diabetic? Yes Did you bring your meter with you today? Yes Pain Assessment Patient in pain? no      Comments Pt states she was out of town and her daughter took her to see her md. Pt states she was'nt feeling well. MD d/c her glyburide and rx metformin & citalopram & gave her sample of welchol to take. pt states she is very nauseated due to the metformin & requip. Requesting md to rx something for nausea. Also want refill on requip   Primary Care Provider:  Newt Lukes MD  CC:  3 month follow-up.  History of Present Illness:  here for follow up -  1) DM2 - now on combo metformin + insulin tx - has meet with nutritionist and working on weight loss, does not wish to follow with endo for same - home cbgs reviewed - no hypoglycemia events or symptoms - another md (while visiting dtr in [place]) stopped glyburide in place of metformin - causes nausea  2) HTN -reports compliance with ongoing medical treatment and no changes in medication dose or frequency. denies adverse side effects related to current therapy. no edema, no CP or HA  3) dyslipidemia - intol of statins with adv rxn in past but tol current meds well - lovaza, fenfib and given sample of welchol by dtrs doc -but only taking one daily  4) c/o poor sleep and depression - exac by stress of mother living with her, intol of ambein in past - no daytime sleepiness symptoms - rx for amitripline 6/2011and uses as needed - improved symptoms - also began citalopram 12/2009 for same   Clinical Review  Panels:  Lipid Management   Cholesterol:  251 (11/09/2009)   LDL (bad choesterol):  127 (08/15/2008)   HDL (good cholesterol):  60.00 (11/09/2009)   Triglycerides:  233 (08/15/2008)  Diabetes Management   HgBA1C:  7.7 (11/09/2009)   Creatinine:  1.1 (11/12/2009)   Last Flu Vaccine:  Historical (02/20/2009)   Last Pneumovax:  Historical (02/21/2006)  CBC   WBC:  8.7 (06/15/2009)   RBC:  4.49 (06/15/2009)   Hgb:  12.6 (06/15/2009)   Hct:  37.4 (06/15/2009)   Platelets:  308.0 (06/15/2009)   MCV  83.4 (06/15/2009)   MCHC  33.7 (06/15/2009)   RDW  15.9 (06/15/2009)   PMN:  52.0 (06/15/2009)   Lymphs:  38.3 (06/15/2009)   Monos:  7.3 (06/15/2009)   Eosinophils:  1.4 (06/15/2009)   Basophil:  1.0 (06/15/2009)  Complete Metabolic Panel   Glucose:  215 (11/12/2009)   Sodium:  137 (11/12/2009)   Potassium:  4.3 (11/12/2009)   Chloride:  97 (11/12/2009)   CO2:  29 (11/12/2009)   BUN:  43 (11/12/2009)   Creatinine:  1.1 (11/12/2009)   Albumin:  3.3 (11/09/2009)   Total Protein:  6.5 (11/09/2009)   Calcium:  9.0 (11/12/2009)   Total  Bili:  0.4 (11/09/2009)   Alk Phos:  40 (11/09/2009)   SGPT (ALT):  16 (11/09/2009)   SGOT (AST):  21 (11/09/2009)   Current Medications (verified): 1)  Aspirin 81 Mg Tabs (Aspirin) .... Once Daily 2)  Vitamin D 1000 Unit Tabs (Cholecalciferol) .... Take 1 Tablet By Mouth A Day 3)  Diovan 160 Mg Tabs (Valsartan) .[name].. 1 By Mouth Once Daily 4)  Requip 4 Mg Tabs (Ropinirole Hcl) .... Take 1 By Mouth Once Daily (Or As Directed) 5)  Calcium-Vitamin D [phone] Mg-Unit Tabs (Calcium-Vitamin D) .... Once Daily 6)  Vitamin E 1000 Unit Caps (Vitamin E) .... Once Daily 7)  Vitamin B-12 1000 Mcg Tabs (Cyanocobalamin) .... Once Daily 8)  Coq10 30 Mg Caps (Coenzyme Q10) .... Once Daily 9)  Ra Omega 3-6-9  Caps (Misc Natural Products) .... Once Daily 10)  Niaspan 1000 Mg Cr-Tabs (Niacin (Antihyperlipidemic)) .... Two  At Bedtime 11)  Coreg 25 Mg Tabs  (Carvedilol) .... One Twice A Day 12)  Lovaza 1 Gm Caps (Omega-3-Acid Ethyl Esters) .... Two Twice Daily 13)  Fenofibrate 160 Mg Tabs (Fenofibrate) .... Take 1 By Mouth Qd 14)  Lasix 40 Mg Tabs (Furosemide) .... Take Two Tablets in The Morning and One Tablet in The Evening 15)  Humalog Kwikpen 100 Unit/ml Soln (Insulin Lispro (Human)) .... 5 Units Three Times A Day (Just Before Each Meal), and Pen Needles Three Times A Day 16)  Freestyle Lite Test  Strp (Glucose Blood) .... Use As Directed Three Times A Day 17)  Amitriptyline Hcl 10 Mg Tabs (Amitriptyline Hcl) .[name].. 1 By Mouth At Bedtime-Rarely Uses 18)  Metformin Hcl 500 Mg Tabs (Metformin Hcl) .... Take 1 Two Times A Day 19)  Citalopram Hydrobromide 20 Mg Tabs (Citalopram Hydrobromide) .... Take 1 By Mouth Once Daily 20)  Welchol 625 Mg Tabs (Colesevelam Hcl) .... Take 1 By Mouth Once Daily  Allergies (verified): 1)  ! Sulfa 2)  Morphine  Past History:  Past Medical History: 1. Diabetes mellitus, type II 2. Hyperlipidemia: She has been unable to tolerate statins (has been on Vytorin, lovastatin, and Crestor) due to what sounds like rhabdo: developed muscle weakness and was told she had "muscle damage" with each of these statins. She has been told not to take statins anymore.  3. Hypertension: prev was told she had "white coat" HTN 4. Obesity 5. Restless Leg Syndrome since age 23yo 6. OA  7. peripheral neuropathy 8. OSA: Mild, does not use CPAP 9. Chronic nausea/diarrhea: ? IBS 10.  Diastolic CHF: Echo (3/11) with normal LV size, moderate LVH, EF 65-70%, LV mid-cavity gradient reaching 63 mmHg with valsalva  but minimal at rest, grade I diastolic dysfunction, cannot estimate PA systolic pressure (no TR doppler signal), RV normal.  RHC (4/11): Mean RA 11, RV 37/13, PA 33/15, mean PCWP 12, CI 3.3.  11. ETT-myoview (4/11): 5'30", stopped due to fatigue, normal EF, no evidence for scar or ischemia.  MD roster: Erline Hau Vassie Loll GI -  Gessner endo - Everardo All) Nsurg - Elsner gyn - susane miller cards- Shirlee Latch  Review of Systems  The patient denies dyspnea on exertion, peripheral edema, headaches, melena, hematochezia, and severe indigestion/heartburn.         c/o open sore in nostril, improved but not resolved with nosprin  Physical Exam  General:  overweight-appearing.  alert, well-developed, well-nourished, and cooperative to examination.    Lungs:  normal respiratory effort, no intercostal retractions or use of accessory muscles; normal breath sounds bilaterally -  no crackles and no wheezes.    Heart:  normal rate, regular rhythm, no murmur, and no rub. BLE without edema.  Abdomen:  obese, soft, non-tender, normal bowel sounds, no distention; no masses and no appreciable hepatomegaly or splenomegaly.     Impression & Recommendations:  Problem # 1:  DIABETES MELLITUS, TYPE II (ICD-250.00)  med changes reviewed - cont same - also a1c done 2-3 weeks ago in [place] - send for copy rather than redraw here - The following medications were removed from the medication list:    Glyburide 5 Mg Tabs (Glyburide) .[name]... 1 by mouth two times a day Her updated medication list for this problem includes:    Aspirin 81 Mg Tabs (Aspirin) ..... Once daily    Diovan 160 Mg Tabs (Valsartan) .[name]... 1 by mouth once daily    Humalog Kwikpen 100 Unit/ml Soln (Insulin lispro (human)) .Marland KitchenMarland KitchenMarland KitchenMarland Kitchen 6 units three times a day (just before each meal), and pen needles three times a day    Metformin Hcl 500 Mg Tabs (Metformin hcl) .[name]... Take 1 two times a day  Labs Reviewed: Creat: 1.1 (11/12/2009)    Reviewed HgBA1c results: 7.7 (11/09/2009)  7.8 (07/06/2009)  Problem # 2:  HYPERLIPIDEMIA (ICD-272.4)  Her updated medication list for this problem includes:    Niaspan 1000 Mg Cr-tabs (Niacin (antihyperlipidemic)) .[name]..[name] Two  at bedtime    Lovaza 1 Gm Caps (Omega-3-acid ethyl esters) .[name]..[name] Two twice daily    Fenofibrate 160 Mg Tabs (Fenofibrate)  .[name]... Take 1 by mouth once daily    Welchol 625 Mg Tabs (Colesevelam hcl) .[name]... Take 1 by mouth once daily  Has been told never to take statins again (?history of rhabdomyolysis).  cont current nonstatin tx as ongoing - send for recent labs  Labs Reviewed: SGOT: 21 (11/09/2009)   SGPT: 16 (11/09/2009)   HDL:60.00 (11/09/2009), 58.30 (05/29/2009)  LDL:127 (08/15/2008), 91 (14/78/2956)  Chol:251 (11/09/2009), 308 (05/29/2009)  Trig:216.0 (11/09/2009), 539.0 (05/29/2009)  Problem # 3:  INSOMNIA (ICD-780.52)  intol of ambien in past -- trial tx now with low dose amitryptline - new erx done also discussed sleep hygiene. complicated by depression - cont new citalopram -   Time spent with patient 25 minutes, more than 50% of this time was spent counseling patient on recent med changes by alt provider, plans to obatin lab recently done and depression symptoms   Problem # 4:  HYPERTENSION (ICD-401.9)  Her updated medication list for this problem includes:    Diovan 160 Mg Tabs (Valsartan) .[name]... 1 by mouth once daily    Coreg 25 Mg Tabs (Carvedilol) ..... One twice a day    Lasix 40 Mg Tabs (Furosemide) .[name]... Take two tablets in the morning and one tablet in the evening  BP today: 120/62 Prior BP: 118/70 (11/12/2009)  Labs Reviewed: K+: 4.3 (11/12/2009) Creat: : 1.1 (11/12/2009)   Chol: 251 (11/09/2009)   HDL: 60.00 (11/09/2009)   LDL: 127 (08/15/2008)   TG: 216.0 (11/09/2009)  Complete Medication List: 1)  Aspirin 81 Mg Tabs (Aspirin) .... Once daily 2)  Vitamin D 1000 Unit Tabs (Cholecalciferol) .... Take 1 tablet by mouth a day 3)  Diovan 160 Mg Tabs (Valsartan) .[name].. 1 by mouth once daily 4)  Requip 4 Mg Tabs (Ropinirole hcl) .... Take 1 by mouth once daily (or as directed) 5)  Calcium-vitamin D [id number] Mg-unit Tabs (calcium-vitamin D)  .... Once daily 6)  Vitamin E 1000 Unit Caps (Vitamin e) .... Once daily 7)  Vitamin B-12  1000 Mcg Tabs (Cyanocobalamin) .... Once daily 8)  Coq10  30 Mg Caps (Coenzyme q10) .... Once daily 9)  Ra Omega 3-6-9 Caps (Misc natural products) .... Once daily 10)  Niaspan 1000 Mg Cr-tabs (Niacin (antihyperlipidemic)) .... Two  at bedtime 11)  Coreg 25 Mg Tabs (Carvedilol) .... One twice a day 12)  Lovaza 1 Gm Caps (Omega-3-acid ethyl esters) .... Two twice daily 13)  Fenofibrate 160 Mg Tabs (Fenofibrate) .... Take 1 by mouth once daily 14)  Lasix 40 Mg Tabs (Furosemide) .... Take two tablets in the morning and one tablet in the evening 15)  Humalog Kwikpen 100 Unit/ml Soln (Insulin lispro (human)) .... 6 units three times a day (just before each meal), and pen needles three times a day 16)  Freestyle Lite Test Strp (Glucose blood) .... Use as directed three times a day 17)  Amitriptyline Hcl 10 Mg Tabs (Amitriptyline hcl) .[name].. 1 by mouth at bedtime-rarely uses 18)  Metformin Hcl 500 Mg Tabs (Metformin hcl) .... Take 1 two times a day 19)  Citalopram Hydrobromide 20 Mg Tabs (Citalopram hydrobromide) .... Take 1 by mouth once daily 20)  Welchol 625 Mg Tabs (Colesevelam hcl) .... Take 1 by mouth once daily 21)  Bactroban 2 % Oint (Mupirocin) .... Apply to sore two times a day x 7 days, then as needed  Patient Instructions: 1)  it was good to see you today. 2)  medications reviewed and updated on our list - continue as discusssed and call if refills needed 3)  will send to dr. Marye Round for copy of recent labs 4)  use bactroban ointment for nose sore as needed - your prescription has been electronically submitted to your pharmacy. Please take as directed. Contact our office if you believe you're having problems with the medication(s).  5)  Please schedule a follow-up appointment in 3 months for diabetes labs, call sooner if problems.  Prescriptions: BACTROBAN 2 % OINT (MUPIROCIN) apply to sore two times a day x 7 days, then as needed  #1 x 0   Entered and Authorized by:   Newt Lukes MD   Signed by:   Newt Lukes MD on 02/01/2010    Method used:   Electronically to        [facility] [address] (retail)       [address]       [place], [place]  [address]       Ph: [phone] or [phone]       Fax: [phone]   RxID:   [id number] REQUIP 4 MG TABS (ROPINIROLE HCL) take 1 by mouth once daily (or as directed)  #30 x 2   Entered by:   Orlan Leavens RMA   Authorized by:   Newt Lukes MD   Signed by:   Orlan Leavens RMA on 02/01/2010   Method used:   Electronically to        [facility] [address] (retail)       [address]       [place], [place]  [address]       Ph: [phone] or [phone]       Fax: [phone]   RxID:   [id number]    Orders Added: 1)  Est. Patient Level IV [254[address]]

## 2010-03-25 NOTE — Assessment & Plan Note (Signed)
Summary: NP6/DIASTOLIC DYSFUNTION-MB   Referring Provider:  Newt Lukes MD Primary Provider:  Newt Lukes MD  CC:  new patient.  diastolic dysfunction.  Pt reports edema in ankles and SOB.  Pt also states she has difficulty sleeping.  History of Present Illness: 65 yo with history of HTN, DM, hyperlipidemia, and chronic dyspnea presents for cardiac evaluation.  Patient recently had an echo showing moderate LVH and preserved LV systolic function.  However, there was a very large LV mid-cavity gradient with valsalva. Starting about 6 months ago, patient developed quite significant exertional dyspnea to the point where she is short of breath walking around her house.  She is unable to climb steps.  She can get out in the yard to do moderate yardwork but has to stop to rest frequently.  She can walk about 1 block before she has to stop to rest.  She has been sleeping in a recliner for about 6 month from time to time due to orthopnea.  She deveoped significant peripheral edema. She is now taking Lasix 40 mg two times a day.  This has helped the edema but has not had much effect on her breathing.  No chest pain or tightness.  She has had a pulmonary evaluation with Dr. Vassie Loll but no primary lung problems have been identified.  BP is running high today (158/88) and was high at her last office visit.   ECG: NSR, RBBB, LAE, old inferior MI  Labs (4/11): TGs 539, HDL 58, LDL 128, K 4.2, creatinine 0.5, TSH normal, BNP 14  Current Medications (verified): 1)  Fish Oil 1000 Mg Caps (Omega-3 Fatty Acids) .... Take 2 By Mouth Qd 2)  Aspirin 81 Mg Tabs (Aspirin) .... Once Daily 3)  Vitamin D 1000 Unit Tabs (Cholecalciferol) .... Take 1 Tablet By Mouth Four Times A Day 4)  Chromium 200 Mcg Tabs (Chromium) .... Once Daily 5)  Glyburide 5 Mg Tabs (Glyburide) .... Take 1 By Mouth Qd 6)  Diovan 160 Mg Tabs (Valsartan) .[name].. 1 By Mouth Once Daily 7)  Requip 4 Mg Tabs (Ropinirole Hcl) .... Take 1 By Mouth  Qd 8)  Calcium-Vitamin D [phone] Mg-Unit Tabs (Calcium-Vitamin D) .... Once Daily 9)  Vitamin E 1000 Unit Caps (Vitamin E) .... Once Daily 10)  Vitamin B-12 1000 Mcg Tabs (Cyanocobalamin) .... Once Daily 11)  Coq10 30 Mg Caps (Coenzyme Q10) .... Once Daily 12)  Ra Omega 3-6-9  Caps (Misc Natural Products) .... Once Daily 13)  Niacin Cr 500 Mg Cr-Caps (Niacin) .[name].. 1 By Mouth At Bedtime 14)  Metformin Hcl 500 Mg Tabs (Metformin Hcl) .... Take 1 Tablet By Mouth Two Times A Day 15)  Furosemide 40 Mg Tabs (Furosemide) .[name].. 1 By Mouth Two Times A Day 16)  Potassium Chloride 20 Meq Pack (Potassium Chloride) .[name].. 1 By Mouth Once Daily  Allergies (verified): 1)  ! Sulfa 2)  Morphine  Past History:  Past Medical History: 1. Diabetes mellitus, type II 2. Hyperlipidemia: She has been unable to tolerate statins (has been on Vytorin, lovastatin, and Crestor) due to what sounds like rhabdomyolysis.  She developed muscle weakness and then was told she had "muscle damage" with each of these statins. She has been told not to take statins anymore.  3. Hypertension: On BP meds for 3-4 years.  Was hypertensive before this but was told she had "white coat" HTN.  4. Obesity 5. Restless Leg Syndrome siince age 56yo 6. OA  7. peripheral neuropathy 8.  OSA: Mild, does not use CPAP 9. Chronic nausea/diarrhea: ? IBS 10.  Diastolic CHF: Echo (3/11) with normal LV size, moderate LVH, EF 65-70%, LV mid-cavity gradient reaching 63 mmHg with valsalva  but minimal at rest, grade I diastolic dysfunction, cannot estimate PA systolic pressure (no TR doppler signal), RV normal.   pulm -[facility] - [facility] - [facility] - [facility]  Family History: Family History of Alcoholism/Addiction (parent) Family History Diabetes 1st degree relative 9grandparent) Family History High cholesterol (parent, grandparent) Family History Hypertension (parent, grandparent) Family History Lung cancer (grandparent) Stomach cancer  (grandmother) Celiac Sprue  daughter Mother with MI at 44, father with MI at 61, uncle with MI at 31, brother with stents in his 31s, multiple aunts with MIs and CVAs  Social History: Reviewed history from 05/22/2009 and no changes required. 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   Vital Signs:  Patient profile:   65 year old female Height:      61 inches Weight:      230 pounds Pulse rate:   89 / minute Pulse rhythm:   regular BP sitting:   158 / 88  (left arm) Cuff size:   large  Vitals Entered By: Judithe Modest CMA (June 01, 2009 11:10 AM)  Physical Exam  General:  Well developed, well nourished, in no acute distress.  Obese.  Head:  normocephalic and atraumatic Nose:  no deformity, discharge, inflammation, or lesions Mouth:  Teeth, gums and palate normal. Oral mucosa normal. Neck:  Neck supple, JVP about 10 cm. No masses, thyromegaly or abnormal cervical nodes. Lungs:  Clear bilaterally to auscultation and percussion. Heart:  Non-displaced PMI, chest non-tender; regular rate and rhythm, S1, S2 without rubs or gallops. 3/6 crescendo-decrescendo systolic murmur at the upper sternal border.  Carotid upstroke normal, no bruit. Pedals normal pulses. 1+ edema 1/2 up lower legs bilaterally.  Abdomen:  Bowel sounds positive; abdomen soft and non-tender without masses, organomegaly, or hernias noted. No hepatosplenomegaly.   Msk:  Back normal, normal gait. Muscle strength and tone normal. Extremities:  No clubbing or cyanosis. Neurologic:  Alert and oriented x 3. Skin:  Intact without lesions or rashes. Psych:  Normal affect.   Impression & Recommendations:  Problem # 1:  DYSPNEA ON EXERTION (ICD-786.09) Patient has significant exertional dyspnea, NYHA class III symptoms.  She does appear volume  overloaded on exam.  She is taking Lasix 40 mg two times a day now but does not feel that it has affected her symptoms much. She has moderate LV hypertrophy and a mid-cavity LV gradient with valsalva likely due to LV hypertrophy and vigorous LV contraction.  Diastolic CHF due to HTN and mid-cavity gradient may certainly be leading to her symptoms. However, given her CAD risk factors (HTN, DM, hyperlipidemia, family history of premature CAD) as well as inferior Qs on ECG, would also consider ischemic diastolic dysfunction.  - ETT-myoview to assess for ischemia as a cause of her symptoms.   - Hold off for now on increasing Lasix as Lasix could potentially worsen the LV mid-cavity gradient by decreasing LV preload.  Will treat her for now with beta blockers (start Coreg 6.25 mg two times a day).  The physiology here may be similar to HOCM, which improves with beta blockers  due to decreasing the LV outflow tract gradient.  - If myoview is normal, will likely do right heart cath to measure filling pressures. If myoview is abnormal, will likely do right and left heart cath.   Problem # 2:  CHRONIC DIASTOLIC HEART FAILURE (ICD-428.32) LV hypertrophy is likely do to longstanding HTN.  The LV mid-cavity gradient may be due to vigorous contraction of the LV in the setting of signifcant LVH.  BNP is not high but could be falsely low due to obesity.  Plan for this as per above.   Problem # 3:  HYPERLIPIDEMIA (ICD-272.4) LDL high given diabetes (goal at least < 100).  Triglycerides are very high.  HDL is good.  I will have her increase niacin to 1000 mg daily and start Lovaza 2 grams two times a day for treatment of her triglycerides.    Problem # 4:  HYPERTENSION (ICD-401.9) BP running high.  Continue valsartan and start Coreg as above.  Preferentially titrate the beta blocker for BP control.   Other Orders: EKG w/ Interpretation (93000) Nuclear Stress Test (Nuc Stress Test)  Patient Instructions: 1)  Your  physician has recommended you make the following change in your medication:  2)  Stop Fish Oil 3)  Start Lovaza 2 G twice a day--this will be two 1000mg  capsules twice a day 4)  Increase Niaspan to 1000mg  daily--take your Aspirin 30 minutes before you take Niaspan and eat a low fat snack. 5)  Start Coreg(carvedilol) 6.25mg  twice a day 6)  Your physician has requested that you have an exercise stress myoview.  For further information please visit [email or web address]  Please follow instruction sheet, as given. 7)  Your physician recommends that you schedule a follow-up appointment in: 2 weeks with Dr Veronda Prude couple of days before the appointment with Dr Shirlee Latch get lab done----BMP 428.32 401.9 786.09 Prescriptions: LOVAZA 1 GM CAPS (OMEGA-3-ACID ETHYL ESTERS) two twice daily  #120 x 6   Entered by:   Katina Dung, RN, BSN   Authorized by:   Marca Ancona, MD   Signed by:   Katina Dung, RN, BSN on 06/01/2009   Method used:   Electronically to        [facility] (retail)       [address]       [place], [place]  [address]       Ph: [phone] or [phone]       Fax: [phone]   RxID:   [id number] LOVAZA 1 GM CAPS (OMEGA-3-ACID ETHYL ESTERS) two tablets daily  #60 x 6   Entered by:   Katina Dung, RN, BSN   Authorized by:   Marca Ancona, MD   Signed by:   Katina Dung, RN, BSN on 06/01/2009   Method used:   Electronically to        [facility] (retail)       [address]       [place], [place]  [address]       Ph: [phone] or [phone]       Fax: [phone]   RxID:   [id number] COREG 6.25 MG TABS (CARVEDILOL) one tablet twice a day  #60 x 6   Entered by:   Katina Dung, RN, BSN   Authorized by:   Marca Ancona, MD   Signed by:   Katina Dung, RN, BSN  on 06/01/2009   Method used:   Electronically to        [facility] [address] [address]  (retail)       [address]       [place], [place]  [address]       Ph: [phone] or [phone]       Fax: [phone]   RxID:   [id number] NIASPAN 1000 MG CR-TABS (NIACIN (ANTIHYPERLIPIDEMIC)) one tablet at bedtime  #30 x 6   Entered by:   Katina Dung, RN, BSN   Authorized by:   Marca Ancona, MD   Signed by:   Katina Dung, RN, BSN on 06/01/2009   Method used:   Electronically to        [facility] (retail)       [address]       [place], [place]  [address]       Ph: [phone] or [phone]       Fax: [phone]   RxID:   [id number]   talked with Aundra Millet at [facility]--she is aware correct prescription for Lovaza is 2G twice a day Luana Shu

## 2010-03-25 NOTE — Letter (Signed)
Summary: [facility]   Imported By: Sherian Rein 05/15/2009 08:56:03  _____________________________________________________________________  External Attachment:    Type:   Image     Comment:   External Document

## 2010-03-25 NOTE — Miscellaneous (Signed)
Summary: Progress Note/[facility]  Progress Note/[facility]   Imported By: Sherian Rein 04/22/2009 09:11:50  _____________________________________________________________________  External Attachment:    Type:   Image     Comment:   External Document

## 2010-03-25 NOTE — Progress Notes (Signed)
Summary: DM Edu, Dietician  Phone Note Call from Patient Call back at [facility] Phone [phone]   Caller: Patient Summary of Call: pt called to check status of referral to Dietician and DM Educator. Pt says she was told by SAE to hold on Insulin until she has had the appropriate education. I do not see orders for either of these referrals in EMR. Please put in referrals. Initial call taken by: Margaret Pyle, CMA,  Jul 14, 2009 9:55 AM  Follow-up for Phone Call        sent Follow-up by: Minus Breeding MD,  Jul 14, 2009 11:48 AM  Additional Follow-up for Phone Call Additional follow up Details #1::        to informed Additional Follow-up by: Margaret Pyle, CMA,  Jul 14, 2009 11:53 AM

## 2010-03-25 NOTE — Progress Notes (Signed)
Summary: elevated CBG/SAE  ---- Converted from flag ---- ---- 07/08/2009 8:21 AM, Verdell Face wrote: pt saw Dr Everardo All on monday and he told her to call back if she wanted to be referred to Dietician, she wants to be referred to a dietician....thx  [phone]  Karelyn Brisby [phone]  cheryl ------------------------------  Phone Note Call from Patient   Summary of Call: Please advise? Initial call taken by: Josph Macho RMA,  Jul 08, 2009 8:21 AM  Follow-up for Phone Call        pt called again stating that her CBGs have been elevated at 271 yesterday and 152 this morning, please advise Follow-up by: Margaret Pyle, CMA,  Jul 08, 2009 8:32 AM  Additional Follow-up for Phone Call Additional follow up Details #1::        increase glyburide to 5mg  two times a day - make ov in next 3-5 days to review (me or sae) - may need to make sure that there is no uti or other infx causing sugars to run high - thx Additional Follow-up by: Newt Lukes MD,  Jul 08, 2009 9:17 AM    Additional Follow-up for Phone Call Additional follow up Details #2::    pt advised and transferred to sch appt with SAE  Follow-up by: Margaret Pyle, CMA,  Jul 08, 2009 9:24 AM  New/Updated Medications: GLYBURIDE 5 MG TABS (GLYBURIDE) take 1 by mouth two times a day

## 2010-03-25 NOTE — Assessment & Plan Note (Signed)
Summary: f/u per dahlia/cd   Vital Signs:  Patient profile:   65 year old female Height:      61 inches (154.94 cm) Weight:      227 pounds (103.18 kg) O2 Sat:      97 % on Room air Temp:     97.5 degrees F (36.39 degrees C) oral Pulse rate:   84 / minute BP sitting:   128 / 70  (left arm) Cuff size:   large  Vitals Entered By: Josph Macho RMA (Jul 10, 2009 8:32 AM)  O2 Flow:  Room air CC: Follow-up visit/ CF Is Patient Diabetic? Yes   Referring Provider:  Newt Lukes MD Primary Provider:  Newt Lukes MD  CC:  Follow-up visit/ CF.  History of Present Illness: the status of at least 3 ongoing medical problems is addressed today: nausea:  pt states sxs are present x 1 year.  changing metformin to -xr did not help.  phenergan caused drowsiness.   dm:  she brings a record of her cbg's which i have reviewed today.  it varies from 170-270.   she says sob is better, but her diet is poor.    Current Medications (verified): 1)  Aspirin 81 Mg Tabs (Aspirin) .... Once Daily 2)  Vitamin D 1000 Unit Tabs (Cholecalciferol) .... Take 1 Tablet By Mouth A Day 3)  Glyburide 5 Mg Tabs (Glyburide) .... Take 1 By Mouth Two Times A Day 4)  Diovan 160 Mg Tabs (Valsartan) .[name].. 1 By Mouth Once Daily 5)  Requip 4 Mg Tabs (Ropinirole Hcl) .... Take 1 By Mouth Qd 6)  Calcium-Vitamin D [phone] Mg-Unit Tabs (Calcium-Vitamin D) .... Once Daily 7)  Vitamin E 1000 Unit Caps (Vitamin E) .... Once Daily 8)  Vitamin B-12 1000 Mcg Tabs (Cyanocobalamin) .... Once Daily 9)  Coq10 30 Mg Caps (Coenzyme Q10) .... Once Daily 10)  Ra Omega 3-6-9  Caps (Misc Natural Products) .... Once Daily 11)  Niaspan 1000 Mg Cr-Tabs (Niacin (Antihyperlipidemic)) .... One Tablet At Bedtime 12)  Coreg 12.5 Mg Tabs (Carvedilol) .... One Tablet Twice A Day 13)  Lovaza 1 Gm Caps (Omega-3-Acid Ethyl Esters) .... Two Twice Daily 14)  Fenofibrate 160 Mg Tabs (Fenofibrate) .... Take 1 By Mouth Qd 15)  Lasix 40 Mg  Tabs (Furosemide) .... Take 1 Two Times A Day 16)  Metformin Hcl 500 Mg Xr24h-Tab (Metformin Hcl) .[name].. 1 Tab Two Times A Day  Allergies (verified): 1)  ! Sulfa 2)  Morphine  Past History:  Past Medical History: Last updated: 06/22/2009 1. Diabetes mellitus, type II 2. Hyperlipidemia: She has been unable to tolerate statins (has been on Vytorin, lovastatin, and Crestor) due to what sounds like rhabdomyolysis.  She developed muscle weakness and then was told she had "muscle damage" with each of these statins. She has been told not to take statins anymore.  3. Hypertension: On BP meds for 3-4 years.  Was hypertensive before this but was told she had "white coat" HTN.   4. Obesity 5. Restless Leg Syndrome siince age 89yo 6. OA  7. peripheral neuropathy 8. OSA: Mild, does not use CPAP 9. Chronic nausea/diarrhea: ? IBS 10.  Diastolic CHF: Echo (3/11) with normal LV size, moderate LVH, EF 65-70%, LV mid-cavity gradient reaching 63 mmHg with valsalva  but minimal at rest, grade I diastolic dysfunction, cannot estimate PA systolic pressure (no TR doppler signal), RV normal.  RHC (4/11): Mean RA 11, RV 37/13, PA 33/15, mean PCWP 12,  CI 3.3.  11. ETT-myoview (4/11): 5'30", stopped due to fatigue, normal EF, no evidence for scar or ischemia.   pulm -Vassie Loll GI - [facility] end[facility] - Everardo All Nsurg - [facility] cards- Shirlee Latch  Review of Systems  The patient denies abdominal pain.         denies vomiting  Physical Exam  General:  morbidly obese.   Extremities:  trace right pedal edema and trace left pedal edema.     Impression & Recommendations:  Problem # 1:  DIABETES MELLITUS, TYPE II (ICD-250.00) needs increased rx  Problem # 2:  NAUSEA AND VOMITING (ICD-787.01) uncertain etiology  Problem # 3:  CHRONIC DIASTOLIC HEART FAILURE (ICD-428.32) this is a relative contraindication to metformin  Medications Added to Medication List This Visit: 1)  Ondansetron 4 Mg Tbdp (Ondansetron) .[name].. 1 every  4 hrs as needed for nausea 2)  Humalog Pen 100 Unit/ml Soln (Insulin lispro (human)) .... 5 units three times a day (just before each meal), and pen needles three times a day  Other Orders: Est. Patient Level IV ([address])  Patient Instructions: 1)  continue seeing dr Leone Payor for nausea. 2)  ondansetron 4 mg every 4 hrs as needed for nausea. 3)  start humalog 5 units three times a day (just before each meal). 4)  refer dietician, and diabetes educator.  you will be called with a day and time for an appointment.  5)  continue diabetes pills for now, although we plan to stop them. 6)  return here 3 weeks. Prescriptions: HUMALOG PEN 100 UNIT/ML SOLN (INSULIN LISPRO (HUMAN)) 5 units three times a day (just before each meal), and pen needles three times a day  #1 box x 11   Entered and Authorized by:   Minus Breeding MD   Signed by:   Minus Breeding MD on 07/10/2009   Method used:   Electronically to        [facility] [address] [address] (retail)       [address]       [place], [place]  [address]       Ph: [phone] or [phone]       Fax: [phone]   RxID:   [id number] ONDANSETRON 4 MG TBDP (ONDANSETRON) 1 every 4 hrs as needed for nausea  #36 x 0   Entered and Authorized by:   Minus Breeding MD   Signed by:   Minus Breeding MD on 07/10/2009   Method used:   Electronically to        [facility] [address] [address] (retail)       [address]       [place], [place]  [address]       Ph: [phone] or [phone]       Fax: [phone]   RxID:   [id number]

## 2010-03-25 NOTE — Progress Notes (Signed)
Summary: RX for MRI  Phone Note Call from Patient Call back at Home Phone [phone]   Caller: Patient Summary of Call: pt called stating that she was scheduled to have open MRI next week but Radiology suggested that she contact PCP and request RX for a few Valium to take prior to study. please advise Initial call taken by: Margaret Pyle, CMA,  May 05, 2009 3:51 PM  Follow-up for Phone Call        may call to her pharmacy: valium 5mg  tab: disp one tab, no refills - may take 1 hour before MRI - thanks Follow-up by: Newt Lukes MD,  May 05, 2009 4:15 PM  Additional Follow-up for Phone Call Additional follow up Details #1::        Rx phoned in, pt informed Additional Follow-up by: Margaret Pyle, CMA,  May 05, 2009 4:20 PM    New/Updated Medications: VALIUM 5 MG TABS (DIAZEPAM) take tab 1 hour before MRI Prescriptions: VALIUM 5 MG TABS (DIAZEPAM) take tab 1 hour before MRI  #1 x 0   Entered and Authorized by:   Margaret Pyle, CMA   Signed by:   Margaret Pyle, CMA on 05/05/2009   Method used:   Telephoned to ...       [facility] (retail)       [address]       [place], [place]  [address]       Ph: [phone] or [phone]       Fax: [phone]   RxID:   [id number]

## 2010-03-25 NOTE — Assessment & Plan Note (Signed)
Summary: NEW/ [employer] /NWS  #   Vital Signs:  Patient profile:   65 year old female Height:      63 inches (160.02 cm) Weight:      230.6 pounds (104.82 kg) BMI:     41.00 O2 Sat:      96 % on Room air Temp:     98.8 degrees F (37.11 degrees C) oral Pulse rate:   104 / minute BP sitting:   136 / 78  (left arm) Cuff size:   large  Vitals Entered By: Orlan Leavens (April 20, 2009 2:09 PM)  Nutrition Counseling: Patient's BMI is greater than 25 and therefore counseled on weight management options.  O2 Flow:  Room air CC: New patient Is Patient Diabetic? No Pain Assessment Patient in pain? no        Primary Care Provider:  Newt Lukes MD  CC:  New patient.  History of Present Illness: new pt to me and our divison - here to est care - prev followed with dr. Sonia Baller and then Cynda Familia, midlevel at [facility]  1) c/o poor exercise tolerance - has note from [facility] concerned about same - manifest as SOB/DOE and weakness in both legs (R>L prox) - denies balance problems or falls - no CP, cough or syncope -   2) DM2 -reports compliance with ongoing medical treatment and no changes in medication dose or frequency. denies adverse side effects related to current therapy.   3) HTN - reports compliance with ongoing medical treatment and no changes in medication dose or frequency. denies adverse side effects related to current therapy.   4) dyslipidemia -  intol of statins due to ?rhabdo (describes recurrent muscle damage episodes) -  on niacin and omega oils - tol these ok at this point -  5) dyspena - see above - also ?OSA -- reports family noted she stops breathing - also snoring and daytime fatigue  5) chronic nausea - a/w freq BMs: 10-12 BM/day - loose, small volume - no abd pain but "never right" -    Preventive Screening-Counseling & Management  Alcohol-Tobacco     Smoking Status: never  Current Medications (verified): 1)  Fish Oil 1000 Mg Caps (Omega-3  Fatty Acids) .... Take 2 By Mouth Qd 2)  Aspirin 81 Mg Tabs (Aspirin) .... Once Daily 3)  Vitamin D 1000 Unit Tabs (Cholecalciferol) .... Once Daily 4)  Chromium 200 Mcg Tabs (Chromium) .... Once Daily 5)  Glyburide 5 Mg Tabs (Glyburide) .... Take 1 By Mouth Qd 6)  Losartan Potassium-Hctz 100-25 Mg Tabs (Losartan Potassium-Hctz) .... Take 1 By Mouth Qd 7)  Requip 4 Mg Tabs (Ropinirole Hcl) .... Take 1 By Mouth Qd 8)  Calcium-Vitamin D [phone] Mg-Unit Tabs (Calcium-Vitamin D) .... Once Daily 9)  Vitamin E 1000 Unit Caps (Vitamin E) .... Once Daily 10)  Vitamin B-12 1000 Mcg Tabs (Cyanocobalamin) .... Once Daily 11)  Coq10 30 Mg Caps (Coenzyme Q10) .... Once Daily 12)  Ra Omega 3-6-9  Caps (Misc Natural Products) .... Once Daily  Allergies (verified): No Known Drug Allergies  Past History:  Past Medical History: Diabetes mellitus, type II Hyperlipidemia Hypertension obesity RLS since age 51yo OA  peripheral neuropathy  Past Surgical History: Cholecystectomy (1997) Tonsillectomy (1970) Tubal ligation  Family History: Family History of Alcoholism/Addiction (parent) Family History Diabetes 1st degree relative 9grandparent) Family History High cholesterol (parent, grandparent) Family History Hypertension (parent, grandparent) Family History Lung cancer (grandparent) Stomach cancer (grandmother)  Social  History: Never Smoked no alcohol married, lives with spouse and her mother 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 I have reviewed all other systems and they were negative.   Physical Exam  General:  overweight-appearing.  alert, well-developed, well-nourished, and cooperative to examination.    Eyes:  vision grossly intact; pupils equal, round and reactive to light.  conjunctiva and lids normal.    Ears:  normal pinnae bilaterally, without erythema, swelling, or tenderness to palpation. TMs clear, without  effusion, or cerumen impaction. Hearing grossly normal bilaterally  Mouth:  teeth and gums in good repair; mucous membranes moist, without lesions or ulcers. oropharynx clear without exudate, no erythema.  Neck:  thick, supple, full ROM, no masses, no thyromegaly; no thyroid nodules or tenderness. no JVD or carotid bruits.   Lungs:  normal respiratory effort, no intercostal retractions or use of accessory muscles; normal breath sounds bilaterally - no crackles and no wheezes.    Heart:  normal rate, regular rhythm, no murmur, and no rub. BLE without edema.  Abdomen:  obese, soft, non-tender, normal bowel sounds, no distention; no masses and no appreciable hepatomegaly or splenomegaly.   Msk:  no fasciulations or atropy - no pain to palp over spine - no joint effusions or deformities Neurologic:  prox quad weakness R>L - needs assist with upper body to raise out of chair - once standing, ambulaates with gaurded but normal gait - no foot drop - alert & oriented X3 and cranial nerves II-XII symetrically intact.  sensation intact to light touch. speech fluent without dysarthria or aphasia; follows commands with good comprehension.  Skin:  no rashes, vesicles, ulcers, or erythema. No nodules or irregularity to palpation.  Psych:  Oriented X3, memory intact for recent and remote, normally interactive, good eye contact, not anxious appearing, not depressed appearing, and not agitated.      Impression & Recommendations:  Problem # 1:  MUSCLE WEAKNESS (GENERALIZED) (ICD-728.87) prox LE most notable -  has seen rheum for similar symptoms in past but reports now owrse - send to OP PT by prior provider - note from PT also concerned with prox weakness noted from rising out of seated posiotion - given this ans chroinc LBP, ?spinal stenosi or other L-pine dz - check plain film now r/o degen scoli or comp fx and order MRI to further eval symptoms  PT as tol - also need to review prior labs (recently done per pt)  - ?CK or ESR to look for rheum dz such as PMR or PM - records requested - close f/u for same - explained to pt who agrees Orders: Misc. Referral (Misc. Ref) T-Lumbar Spine 2 Views (72100TC)  Problem # 2:  BACK PAIN, LUMBAR, CHRONIC (ICD-724.2) see above Her updated medication list for this problem includes:    Aspirin 81 Mg Tabs (Aspirin) ..... Once daily  Orders: Misc. Referral (Misc. Ref) T-Lumbar Spine 2 Views (72100TC)  Problem # 3:  DYSPNEA (ICD-786.05) ?deconditioning or other lung dz? - chheck CXR - O2 sats ok and exam benign - refer to pulm to eval this along with ?OSA -- Orders: Sleep Disorder Referral (Sleep Disorder) T-2 View CXR (71020TC)  Problem # 4:  OBESITY (ICD-278.00) pt aware of effect of weight on other medical probs (breathing, DM, BP, arthritis pain) - reveiewed and cont education on same today-- Orders: Sleep Disorder Referral (Sleep Disorder) T-Lumbar Spine 2 Views (72100TC)  Ht: 63 (04/20/2009)   Wt: 230.6 (04/20/2009)   BMI: 41.00 (04/20/2009)  Problem # 5:  HYPERLIPIDEMIA (ICD-272.4) intol of statins with presumed hx rhabdo - records requested ans wil be reviewed - also review last FLP -  cont same meds for now Her updated medication list for this problem includes:    Niacin Cr 500 Mg Cr-caps (Niacin) .[name]... 1 by mouth at bedtime  Problem # 6:  DIABETES MELLITUS, TYPE II (ICD-250.00) send for records - no med changes rec at this time Her updated medication list for this problem includes:    Aspirin 81 Mg Tabs (Aspirin) ..... Once daily    Glyburide 5 Mg Tabs (Glyburide) .[name]... Take 1 by mouth qd    Losartan Potassium-hctz 100-25 Mg Tabs (Losartan potassium-hctz) .[name]... Take 1 by mouth qd  Problem # 7:  HYPERTENSION (ICD-401.9)  Her updated medication list for this problem includes:    Losartan Potassium-hctz 100-25 Mg Tabs (Losartan potassium-hctz) .[name]... Take 1 by mouth qd  BP today: 136/78  Problem # 8:  NAUSEA, CHRONIC  (ICD-787.02) Assessment: Improved  a/w frew BM during each day - ?malabs - refer back to GI for further eval and review Orders: Gastroenterology Referral (GI)  Discussed symptom control.   Complete Medication List: 1)  Fish Oil 1000 Mg Caps (Omega-3 fatty acids) .... Take 2 by mouth qd 2)  Aspirin 81 Mg Tabs (Aspirin) .... Once daily 3)  Vitamin D 1000 Unit Tabs (Cholecalciferol) .... Once daily 4)  Chromium 200 Mcg Tabs (Chromium) .... Once daily 5)  Glyburide 5 Mg Tabs (Glyburide) .... Take 1 by mouth qd 6)  Losartan Potassium-hctz 100-25 Mg Tabs (Losartan potassium-hctz) .... Take 1 by mouth qd 7)  Requip 4 Mg Tabs (Ropinirole hcl) .... Take 1 by mouth qd 8)  Calcium-vitamin D [phone] Mg-unit Tabs (calcium-vitamin D)  .... Once daily 9)  Vitamin E 1000 Unit Caps (Vitamin e) .... Once daily 10)  Vitamin B-12 1000 Mcg Tabs (Cyanocobalamin) .... Once daily 11)  Coq10 30 Mg Caps (Coenzyme q10) .... Once daily 12)  Ra Omega 3-6-9 Caps (Misc natural products) .... Once daily 13)  Niacin Cr 500 Mg Cr-caps (Niacin) .[name].. 1 by mouth at bedtime  Patient Instructions: 1)  it was good to see you today. 2)  we'll make referral to [facility] for sleep evaluation and [facility] for stomach and bowel evaluation. Our office will contact you regarding these appointments once made.  3)  xrays of lungs and low back ordered today - your results will be called to you after review 4)  we'll make referral for MRI of low back because of leg weakness. Our office will contact you regarding this appointment once made.  5)  will send for records from [place] [facility] to review -  6)  medicines reviewed today and no medication changes recommended at this time - 7)  Please schedule a follow-up appointment in 4-6 weeks to continue review, sooner if problems.    Mammogram  Procedure date:  02/22/2007  Findings:      No specific mammographic evidence of malignancy.    Pap Smear  Procedure  date:  02/22/2008  Findings:      Interpretation/Result:Negative for intraepithelial Lesion or Malignancy.     Colonoscopy  Procedure date:  12/18/2001  Findings:      Results: Hemorrhoids.     Location:  [facility].  recommend home hemoccult every 5 years and colonoscopy every 10 years  EGD  Procedure date:  01/06/1993  Findings:      Done by dr. Kinnie Scales Findings: Gastritis      Immunization History:  Tetanus/Td Immunization History:    Tetanus/Td:  historical (02/22/2004)  Pneumovax Immunization History:    Pneumovax:  historical (02/21/2006)

## 2010-03-25 NOTE — Assessment & Plan Note (Signed)
Summary: per check out/sf   Visit Type:  Follow-up Referring Provider:  Newt Lukes MD Primary Provider:  Newt Lukes MD  CC:  Sob.  History of Present Illness: 65 yo with history of HTN, DM, hyperlipidemia, and chronic dyspnea returns for cardiac evaluation.  Patient had an echo showing moderate LVH and preserved LV systolic function.  However, there was a very large LV mid-cavity gradient with valsalva. Starting about a year ago, patient developed quite significant exertional dyspnea to the point where she was short of breath walking around her house.  She has had a pulmonary evaluation with Dr. Vassie Loll but no primary lung problems have been identified.    Patient had an ETT-myoview which was negative for ischemia or infarction.  RIght heart cath showed mildly elevated right heart filling pressures but normal PA pressure and normal PCWP.  I started her on a beta blocker (Coreg) to try to lower her LV mid-cavity gradient (that likely occurs with exertion) and to better control BP.  She is also on Lasix.  Weight is down 2 lbs since I last saw her.  She still is short of breath after walking about 50 feet.  She is able to walk 1/2 mile, however, if she walks slowly.  She is able to work in her garden. No chest pain.  She has been under a lot of stress as her elderly parents are divorcing.   Labs (4/11): TGs 539, HDL 58, LDL 128, K 5.2, creatinine 1.3, TSH normal, BNP 14 Labs (5/11): K 3.9, creatinine 1.0 Labs (9/11): creatinine 1.1, BUN 43, LDL 145, HDL 60  ECG: NSR, RBBB  Current Medications (verified): 1)  Aspirin 81 Mg Tabs (Aspirin) .... Once Daily 2)  Vitamin D 1000 Unit Tabs (Cholecalciferol) .... Take 1 Tablet By Mouth A Day 3)  Diovan 160 Mg Tabs (Valsartan) .[name].. 1 By Mouth Once Daily 4)  Requip 4 Mg Tabs (Ropinirole Hcl) .... Take 1 By Mouth Qd 5)  Calcium-Vitamin D [phone] Mg-Unit Tabs (Calcium-Vitamin D) .... Once Daily 6)  Vitamin E 1000 Unit Caps (Vitamin E) ....  Once Daily 7)  Vitamin B-12 1000 Mcg Tabs (Cyanocobalamin) .... Once Daily 8)  Coq10 30 Mg Caps (Coenzyme Q10) .... Once Daily 9)  Ra Omega 3-6-9  Caps (Misc Natural Products) .... Once Daily 10)  Niaspan 1000 Mg Cr-Tabs (Niacin (Antihyperlipidemic)) .... One Tablet At Bedtime 11)  Coreg 12.5 Mg Tabs (Carvedilol) .... One and One-Half  Tablets  Twice A Day 12)  Lovaza 1 Gm Caps (Omega-3-Acid Ethyl Esters) .... Two Twice Daily 13)  Fenofibrate 160 Mg Tabs (Fenofibrate) .... Take 1 By Mouth Qd 14)  Lasix 40 Mg Tabs (Furosemide) .... Take Two Tablets in The Morning and One Tablet in The Evening 15)  Humalog Kwikpen 100 Unit/ml Soln (Insulin Lispro (Human)) .... 5 Units Three Times A Day (Just Before Each Meal), and Pen Needles Three Times A Day 16)  Freestyle Lite Test  Strp (Glucose Blood) .... Use As Directed Three Times A Day 17)  Amitriptyline Hcl 10 Mg Tabs (Amitriptyline Hcl) .[name].. 1 By Mouth At Bedtime-Rarely Uses 18)  Glyburide 5 Mg Tabs (Glyburide) .[name].. 1 By Mouth Two Times A Day  Allergies: 1)  ! Sulfa 2)  Morphine  Past History:  Past Medical History: Reviewed history from 11/02/2009 and no changes required. 1. Diabetes mellitus, type II 2. Hyperlipidemia: She has been unable to tolerate statins (has been on Vytorin, lovastatin, and Crestor) due to what sounds like  rhabdomyolysis: developed muscle weakness and was told she had "muscle damage" with each of these statins. She has been told not to take statins anymore.  3. Hypertension: prev was told she had "white coat" HTN  4. Obesity 5. Restless Leg Syndrome siince age 21yo 6. OA  7. peripheral neuropathy 8. OSA: Mild, does not use CPAP 9. Chronic nausea/diarrhea: ? IBS 10.  Diastolic CHF: Echo (3/11) with normal LV size, moderate LVH, EF 65-70%, LV mid-cavity gradient reaching 63 mmHg with valsalva  but minimal at rest, grade I diastolic dysfunction, cannot estimate PA systolic pressure (no TR doppler signal), RV normal.  RHC  (4/11): Mean RA 11, RV 37/13, PA 33/15, mean PCWP 12, CI 3.3.  11. ETT-myoview (4/11): 5'30", stopped due to fatigue, normal EF, no evidence for scar or ischemia.  MD roster: Erline Hau Vassie Loll GI - [facility] endo - Everardo All) Nsurg - Elsner gyn - susane miller cards- Shirlee Latch  Family History: Reviewed history from 07/06/2009 and no changes required. Family History of Alcoholism/Addiction (parent) Family History Diabetes 1st degree relative (grandparent) Family History High cholesterol (parent, grandparent) Family History Hypertension (parent, grandparent) Family History Lung cancer (grandparent) Stomach cancer (grandmother) Celiac Sprue  daughter Mother with MI at 21, father with MI at 54, uncle with MI at 60, brother with stents in his 22s, multiple aunts with MIs and CVAs  Social History: Reviewed history from 05/22/2009 and no changes required. 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   Vital Signs:  Patient profile:   65 year old female Height:      61 inches Weight:      232.50 pounds BMI:     44.09 O2 Sat:      96 % on Room air Pulse rate:   76 / minute Pulse rhythm:   regular Resp:     20 per minute BP sitting:   118 / 70  (left arm) Cuff size:   76large  Vitals Entered By: Vikki Ports (November 12, 2009 8:42 AM)  O2 Flow:  Room air  Physical Exam  General:  Well developed, well nourished, in no acute distress. Obese.  Neck:  Neck supple, no JVD. No masses, thyromegaly or abnormal cervical nodes. Lungs:  Clear bilaterally to auscultation and percussion. Heart:  Non-displaced PMI, chest non-tender; regular rate and rhythm, S1, S2 without rubs or gallops. 2/6 systolic crescendo-decrescendo murmur along the sternal border.  Carotid upstroke normal, no bruit. Pedals normal pulses. Trace ankle  edema.  Abdomen:  Bowel sounds positive; abdomen soft and non-tender without masses, organomegaly, or hernias noted. No hepatosplenomegaly. Extremities:  No clubbing or cyanosis. Neurologic:  Alert and oriented x 3. Psych:  Normal affect.   Impression & Recommendations:  Problem # 1:  CHRONIC DIASTOLIC HEART FAILURE (ICD-428.32) LV hypertrophy is likely due to longstanding HTN.  The LV mid-cavity gradient may be due to vigorous contraction of the LV in the setting of significant LVH.  BNP has not been high but could be falsely low due to obesity. Right heart cath showed mildly elevated right heart filling pressures but no pulmonary hypertension and normal PCWP.  Patient continues to be short of breath.  Beta blockade can help by lowering the LV mid-cavity gradient (relaxing the heart), leading to lower filling pressures.  I will increase  Coreg to 25 mg two times a day. She does not appear particularly volume overloaded and BUN is high, so I will not increase her Lasix any further and may need to decrease it in the near future.  Her dyspnea is likely multifactorial, with obesity/deconditioning certainly playing a role.  Increase exercise level.   Problem # 2:  HYPERLIPIDEMIA (ICD-272.4) Has been told never to take statins again (?history of rhabdomyolysis).  LDL was quite high.  I will have her increase Niaspan to 2000 mg daily.  Will need to follow glucose carefully with uptitration of Niaspan.   Other Orders: TLB-BMP (Basic Metabolic Panel-BMET) (80048-METABOL)  Patient Instructions: 1)  Your physician has recommended you make the following change in your medication:  2)  Increase Niaspan to 2000mg  at bedtime--this will be two 1000mg  at bedtime. 3)  Increase Coreg(carvedilol) to 25mg  twice a day. 4)  Lab today--BMP 786.09 5)  Your physician wants you to follow-up in: 4 months with Dr Shirlee Latch.  You will receive a reminder letter in the mail two months in advance. If you don't receive a letter,  please call our office to schedule the follow-up appointment. Prescriptions: COREG 25 MG TABS (CARVEDILOL) one twice a day  #60 x 6   Entered by:   Katina Dung, RN, BSN   Authorized by:   Marca Ancona, MD   Signed by:   Katina Dung, RN, BSN on 11/12/2009   Method used:   Electronically to        [facility] (retail)       [address]       [place], [place]  [address]       Ph: [phone] or [phone]       Fax: [phone]   RxID:   [id number] NIASPAN 1000 MG CR-TABS (NIACIN (ANTIHYPERLIPIDEMIC)) two  at bedtime  #60 x 6   Entered by:   Katina Dung, RN, BSN   Authorized by:   Marca Ancona, MD   Signed by:   Katina Dung, RN, BSN on 11/12/2009   Method used:   Electronically to        [facility] (retail)       [address]       [place], [place]  [address]       Ph: [phone] or [phone]       Fax: [phone]   RxID:   [id number]

## 2010-03-25 NOTE — Progress Notes (Signed)
Summary: Nuclear Pre-Procedure  Phone Note Outgoing Call   Call placed by: Milana Na, EMT-P,  June 01, 2009 3:22 PM Summary of Call: Reviewed information on Myoview Information Sheet (see scanned document for further details).  Spoke with patient.     Nuclear Med Background Indications for Stress Test: Evaluation for Ischemia   History: Echo  History Comments: 05/05/09 ECHO Mod LVH EF 65-70% Diastolic Dysfunction   Symptoms Comments: Bilateral Ankle Edema   Nuclear Pre-Procedure Cardiac Risk Factors: Hypertension, Lipids, NIDDM Height (in): 61  Nuclear Med Study Referring MD:  D.McLean

## 2010-03-25 NOTE — Assessment & Plan Note (Signed)
Summary: APPT. 12:15P/OKMPER ANNE/D.MILLER   Referring Provider:  Newt Lukes MD Primary Provider:  Newt Lukes MD  CC:  follow up to heart cath.  Pt reports more energy in the last two days.  History of Present Illness: 65 yo with history of HTN, DM, hyperlipidemia, and chronic dyspnea returns for cardiac evaluation.  Patient recently had an echo showing moderate LVH and preserved LV systolic function.  However, there was a very large LV mid-cavity gradient with valsalva. Starting about 6 months ago, patient developed quite significant exertional dyspnea to the point where she is short of breath walking around her house.  She was unable to climb steps.  She could get out in the yard to do moderate yardwork but had to stop to rest frequently.  She could walk about 1 block before she had to stop to rest.  She had been sleeping in a recliner for about 6 month from time to time due to orthopnea.  She developed significant peripheral edema.  No chest pain or tightness.  She has had a pulmonary evaluation with Dr. Vassie Loll but no primary lung problems have been identified.    Patient had an ETT-myoview which was negative for ischemia or infarction.  RIght heart cath showed mildly elevated right heart filling pressures but normal PA pressure and normal PCWP.  I started her on a beta blocker (Coreg) to try to lower her LV mid-cavity gradient (that likely occurs with exertion) and to better control BP.  She says that she has been feeling a lot better since starting Coreg.  She is still short of breath with moderate exertion but has more energy. She has lost 5 lbs since last appointment, presumably due to the Lasix 40 mg two times a day that she is taking.    Labs (4/11): TGs 539, HDL 58, LDL 128, K 5.2, creatinine 1.3, TSH normal, BNP 14  Current Medications (verified): 1)  Aspirin 81 Mg Tabs (Aspirin) .... Once Daily 2)  Vitamin D 1000 Unit Tabs (Cholecalciferol) .... Take 1 Tablet By Mouth A  Day 3)  Glyburide 5 Mg Tabs (Glyburide) .... Take 1 By Mouth Qd 4)  Diovan 160 Mg Tabs (Valsartan) .[name].. 1 By Mouth Once Daily 5)  Requip 4 Mg Tabs (Ropinirole Hcl) .... Take 1 By Mouth Qd 6)  Calcium-Vitamin D [phone] Mg-Unit Tabs (Calcium-Vitamin D) .... Once Daily 7)  Vitamin E 1000 Unit Caps (Vitamin E) .... Once Daily 8)  Vitamin B-12 1000 Mcg Tabs (Cyanocobalamin) .... Once Daily 9)  Coq10 30 Mg Caps (Coenzyme Q10) .... Once Daily 10)  Ra Omega 3-6-9  Caps (Misc Natural Products) .... Once Daily 11)  Niaspan 1000 Mg Cr-Tabs (Niacin (Antihyperlipidemic)) .... One Tablet At Bedtime 12)  Metformin Hcl 500 Mg Tabs (Metformin Hcl) .... Take 1 Tablet By Mouth Two Times A Day 13)  Coreg 6.25 Mg Tabs (Carvedilol) .... One Tablet Twice A Day 14)  Lovaza 1 Gm Caps (Omega-3-Acid Ethyl Esters) .... Two Twice Daily 15)  Fenofibrate 160 Mg Tabs (Fenofibrate) .... Take 1 By Mouth Qd 16)  Lasix 40 Mg Tabs (Furosemide) .... Take 1 Two Times A Day  Allergies (verified): 1)  ! Sulfa 2)  Morphine  Past History:  Past Medical History: 1. Diabetes mellitus, type II 2. Hyperlipidemia: She has been unable to tolerate statins (has been on Vytorin, lovastatin, and Crestor) due to what sounds like rhabdomyolysis.  She developed muscle weakness and then was told she had "muscle damage" with each  of these statins. She has been told not to take statins anymore.  3. Hypertension: On BP meds for 3-4 years.  Was hypertensive before this but was told she had "white coat" HTN.   4. Obesity 5. Restless Leg Syndrome siince age 54yo 6. OA  7. peripheral neuropathy 8. OSA: Mild, does not use CPAP 9. Chronic nausea/diarrhea: ? IBS 10.  Diastolic CHF: Echo (3/11) with normal LV size, moderate LVH, EF 65-70%, LV mid-cavity gradient reaching 63 mmHg with valsalva  but minimal at rest, grade I diastolic dysfunction, cannot estimate PA systolic pressure (no TR doppler signal), RV normal.  RHC (4/11): Mean RA 11, RV 37/13,  PA 33/15, mean PCWP 12, CI 3.3.  11. ETT-myoview (4/11): 5'30", stopped due to fatigue, normal EF, no evidence for scar or ischemia.   pulm -Vassie Loll GI - [facility] - [facility] - [facility] cards- Shirlee Latch  Family History: Reviewed history from 06/01/2009 and no changes required. Family History of Alcoholism/Addiction (parent) Family History Diabetes 1st degree relative 9grandparent) Family History High cholesterol (parent, grandparent) Family History Hypertension (parent, grandparent) Family History Lung cancer (grandparent) Stomach cancer (grandmother) Celiac Sprue  daughter Mother with MI at 38, father with MI at 67, uncle with MI at 54, brother with stents in his 35s, multiple aunts with MIs and CVAs  Social History: Reviewed history from 05/22/2009 and no changes required. 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   Vital Signs:  Patient profile:   65 year old female Height:      61 inches Weight:      225 pounds BMI:     42.67 Pulse rate:   84 / minute Pulse rhythm:   regular BP sitting:   140 / 72  (left arm) Cuff size:   large  Vitals Entered By: Judithe Modest CMA (Jun 22, 2009 12:38 PM)  Physical Exam  General:  Well developed, well nourished, in no acute distress. Obese.  Neck:  Neck supple, JVP about 8 cm. No masses, thyromegaly or abnormal cervical nodes. Lungs:  Clear bilaterally to auscultation and percussion. Heart:  Non-displaced PMI, chest non-tender; regular rate and rhythm, S1, S2 without rubs or gallops. 2/6 systolic crescendo-decrescendo murmur along the sternal border.  Carotid upstroke normal, no bruit. Pedals normal pulses. Trace ankle edema.  Abdomen:  Bowel sounds positive; abdomen soft and non-tender without masses, organomegaly, or hernias noted. No  hepatosplenomegaly.   Extremities:  No clubbing or cyanosis. Neurologic:  Alert and oriented x 3. Psych:  Normal affect.   Impression & Recommendations:  Problem # 1:  CHRONIC DIASTOLIC HEART FAILURE (ICD-428.32) LV hypertrophy is likely do to longstanding HTN.  The LV mid-cavity gradient may be due to vigorous contraction of the LV in the setting of signifcant LVH.  BNP is not high but could be falsely low due to obesity. Right heart cath showed mildly elevated right heart filling pressures but no pulmonary hypertension and normal PCWP.  She is feeling less short of breath since starting Lasix and Coreg.  She is probably still mildly volume overloaded.  I will continue her on the current dose of Lasix and will increase the Coreg to 12.5 mg two times a day.  I think that Coreg is helping by lowering the LV mid-cavity gradient (relaxing  the heart), leading to lower filling pressures.    Problem # 2:  DYSPNEA ON EXERTION (ICD-786.09) Probably primarily due to diastolic CHF, as above.  No evidence for ischemia on myoview.    Problem # 3:  HYPERLIPIDEMIA (ICD-272.4) Repeat lipids in 6/11 now that she has increased Lovaza.   Patient Instructions: 1)  Your physician has recommended you make the following change in your medication:  2)  Increase Coreg to 12.5mg  twice a day 3)  Your physician recommends that you have  lab work today---BMP 428.32  4)  Your physician recommends that you schedule a follow-up appointment in: 2 months with Dr Shirlee Latch. 5)  Your physician recommends that you return for a FASTING lipid profile/liver profile a few days before your 2 month appointment with Dr Willette Cluster 401.9  Prescriptions: COREG 12.5 MG TABS (CARVEDILOL) one tablet twice a day  #60 x 12   Entered by:   Katina Dung, RN, BSN   Authorized by:   Marca Ancona, MD   Signed by:   Katina Dung, RN, BSN on 06/22/2009   Method used:   Electronically to        [facility]  (retail)       [address]       [place], [place]  [address]       Ph: [phone] or [phone]       Fax: [phone]   RxID:   [id number]

## 2010-03-25 NOTE — Progress Notes (Signed)
----   Converted from flag ---- ---- 05/29/2009 1:24 PM, Corwin Levins MD wrote: pt does not need labs until [facility] apr 11 as per my note  ---- 05/29/2009 9:01 AM, Zella Ball Ewing wrote: called and scheduled pt for labs today 05/29/2009.   ---- 05/28/2009 5:35 PM, Corwin Levins MD wrote: Zella Ball:  1)pt needs future labs:   bmet 429.9,  BNP  429.9, lipids 272.0, and TSH  780.79  for Mon AM apr 11  2)   needs ROV with Dr Felicity Coyer  6 wks ------------------------------  called pt left msg to call back 05/29/2009  called pt and she did labs this morning and is scheduled to see Dr. Felicity Coyer the end of April 05/29/2009

## 2010-03-25 NOTE — Assessment & Plan Note (Signed)
Summary: ADEMA-LB   Vital Signs:  Patient profile:   65 year old female Height:      61 inches Weight:      235.38 pounds BMI:     44.64 O2 Sat:      97 % on Room air Temp:     98.1 degrees F oral Pulse rate:   92 / minute BP sitting:   162 / 64  (left arm) Cuff size:   large  Vitals Entered ByZella Ball Ewing (May 28, 2009 4:57 PM)  O2 Flow:  Room air CC: Both feet swelling, congestion/RE   Primary Care Provider:  Newt Lukes MD  CC:  Both feet swelling and congestion/RE.  History of Present Illness: "I Love water"  Here to c/o persistent and worsening LE edema despite lasix 40 once daily per Dr Vassie Loll for diast CHF;  admits to increaed by mouth intake recently b/c she though somehow this might help;  she is keen to blame the amlodipine since she was told per Dr Felicity Coyer she might have some edema related to this;  is quite uncomfortable with her feet and leg swelling.  Overall good complaicne wtih med, good tolerance.  Pt denies CP, sob, doe, wheezing, orthopnea, pnd, palps, dizziness or syncope   Pt denies new neuro symptoms such as headache, facial or extremity weakness States she also needs lipids checked if possible.  BP has been very difficult to control as well.  Has been trying to follow lower chol diet, but hard to lose wt.    Problems Prior to Update: 1)  Nausea and Vomiting  (ICD-787.01) 2)  Diarrhea  (ICD-787.91) 3)  Diastolic Dysfunction  (ICD-429.9) 4)  Vitamin D Deficiency  (ICD-268.9) 5)  Obstructive Sleep Apnea  (ICD-780.57) 6)  Peripheral Neuropathy  (ICD-356.9) 7)  Restless Legs Syndrome  (ICD-333.94) 8)  Back Pain, Lumbar, Chronic  (ICD-724.2) 9)  Muscle Weakness (GENERALIZED)  (ICD-728.87) 10)  Dyspnea  (ICD-786.05) 11)  Obesity  (ICD-278.00) 12)  Hyperlipidemia  (ICD-272.4) 13)  Diabetes Mellitus, Type II  (ICD-250.00) 14)  Hypertension  (ICD-401.9) 15)  Family History Diabetes 1st Degree Relative  (ICD-V18.0) 16)  Family History of  Alcoholism/addiction  (ICD-V61.41) 17)  Uterine Polyp  (ICD-621.0) 18)  Umbilical Hernia, Hx of  (ICD-V13.8) 19)  Renal Calculus, Hx of  (ICD-V13.01) 20)  Cardiac Murmur  (ICD-785.2) 21)  Chickenpox, Hx of  (ICD-V15.9) 22)  Arthritis  (ICD-716.90) 23)  Vomiting  (ICD-787.03) 24)  Nausea, Chronic  (ICD-787.02) 25)  Liver Function Tests, Abnormal, Hx of  (ICD-V12.2) 26)  Duodenitis  (ICD-535.60)  Medications Prior to Update: 1)  Fish Oil 1000 Mg Caps (Omega-3 Fatty Acids) .... Take 2 By Mouth Qd 2)  Aspirin 81 Mg Tabs (Aspirin) .... Once Daily 3)  Vitamin D 1000 Unit Tabs (Cholecalciferol) .... Take 1 Tablet By Mouth Four Times A Day 4)  Chromium 200 Mcg Tabs (Chromium) .... Once Daily 5)  Glyburide 5 Mg Tabs (Glyburide) .... Take 1 By Mouth Qd 6)  Losartan Potassium-Hctz 100-25 Mg Tabs (Losartan Potassium-Hctz) .... Take 1 By Mouth Qd 7)  Requip 4 Mg Tabs (Ropinirole Hcl) .... Take 1 By Mouth Qd 8)  Calcium-Vitamin D [phone] Mg-Unit Tabs (Calcium-Vitamin D) .... Once Daily 9)  Vitamin E 1000 Unit Caps (Vitamin E) .... Once Daily 10)  Vitamin B-12 1000 Mcg Tabs (Cyanocobalamin) .... Once Daily 11)  Coq10 30 Mg Caps (Coenzyme Q10) .... Once Daily 12)  Ra Omega 3-6-9  Caps (Misc Natural  Products) .... Once Daily 13)  Niacin Cr 500 Mg Cr-Caps (Niacin) .[name].. 1 By Mouth At Bedtime 14)  Metformin Hcl 500 Mg Tabs (Metformin Hcl) .... Take 1 Tablet By Mouth Two Times A Day 15)  Amlodipine Besylate 5 Mg Tabs (Amlodipine Besylate) .[name].. 1 By Mouth Once Daily 16)  Furosemide 40 Mg Tabs (Furosemide) .... Once Daily 17)  Potassium Chloride 20 Meq Pack (Potassium Chloride) .... Every Other Day  Current Medications (verified): 1)  Fish Oil 1000 Mg Caps (Omega-3 Fatty Acids) .... Take 2 By Mouth Qd 2)  Aspirin 81 Mg Tabs (Aspirin) .... Once Daily 3)  Vitamin D 1000 Unit Tabs (Cholecalciferol) .... Take 1 Tablet By Mouth Four Times A Day 4)  Chromium 200 Mcg Tabs (Chromium) .... Once Daily 5)   Glyburide 5 Mg Tabs (Glyburide) .... Take 1 By Mouth Qd 6)  Benicar 40 Mg Tabs (Olmesartan Medoxomil) .[name].. 1po Once Daily 7)  Requip 4 Mg Tabs (Ropinirole Hcl) .... Take 1 By Mouth Qd 8)  Calcium-Vitamin D [phone] Mg-Unit Tabs (Calcium-Vitamin D) .... Once Daily 9)  Vitamin E 1000 Unit Caps (Vitamin E) .... Once Daily 10)  Vitamin B-12 1000 Mcg Tabs (Cyanocobalamin) .... Once Daily 11)  Coq10 30 Mg Caps (Coenzyme Q10) .... Once Daily 12)  Ra Omega 3-6-9  Caps (Misc Natural Products) .... Once Daily 13)  Niacin Cr 500 Mg Cr-Caps (Niacin) .[name].. 1 By Mouth At Bedtime 14)  Metformin Hcl 500 Mg Tabs (Metformin Hcl) .... Take 1 Tablet By Mouth Two Times A Day 15)  Furosemide 40 Mg Tabs (Furosemide) .[name].. 1 By Mouth Two Times A Day 16)  Potassium Chloride 20 Meq Pack (Potassium Chloride) .[name].. 1 By Mouth Once Daily  Allergies (verified): 1)  Morphine  Past History:  Past Medical History: Last updated: 05/22/2009 Diabetes mellitus, type II Hyperlipidemia Hypertension obesity Restless Leg Syndrome siince age 22yo OA  peripheral neuropathy OSA  MD rooster - pulm -Vassie Loll GI - [facility] endo [facility] - [facility]  Past Surgical History: Last updated: 04/29/2009 Cholecystectomy (1997) Tonsillectomy (1970) Tubal ligation (1980) Uterine Polyp removal (2008) Umbilical hernia repair (1995)  Social History: Last updated: 05/22/2009 Never Smoked no alcohol married, lives with spouse and her mother 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   Head:  normocephalic and atraumatic.   Eyes:  vision grossly intact, pupils equal, and pupils round.   Ears:  R ear normal and L ear normal.   Nose:  no external deformity and no nasal discharge.   Mouth:  no gingival abnormalities and  pharynx pink and moist.   Neck:  supple and no masses.   Lungs:  normal respiratory effort and normal breath sounds.   Heart:  normal rate and regular rhythm.   Extremities:  2-3+ edema bilat, no ulcers or weeping   Impression & Recommendations:  Problem # 1:  DIASTOLIC DYSFUNCTION (ICD-429.9) d/w pt - to only drink fluids if thirsty/dont push fluids;  increase lasix 40 two times a day, K daily;  I dont think amlodipine likely a significant factor but will defer to pt and d/c for now;  f/u labs Mon apr 11, f/u cards Mon as planned as well  Problem # 2:  HYPERTENSION (ICD-401.9)  The following medications  were removed from the medication list:    Amlodipine Besylate 5 Mg Tabs (Amlodipine besylate) .[name]... 1 by mouth once daily Her updated medication list for this problem includes:    Benicar 40 Mg Tabs (Olmesartan medoxomil) .[name]... 1po once daily    Furosemide 40 Mg Tabs (Furosemide) .[name]... 1 by mouth two times a day d/c the losart/hct;  stop the amlidpine as above;  start benicar 40 once daily (gave copay assist card to help with copay cost);  consider re-start the amlidpine or other for better BP control  Problem # 3:  HYPERLIPIDEMIA (ICD-272.4)  Her updated medication list for this problem includes:    Niacin Cr 500 Mg Cr-caps (Niacin) .[name]... 1 by mouth at bedtime  Labs Reviewed: SGOT: 25 (04/29/2009)   SGPT: 21 (04/29/2009)   HDL:62 (08/15/2008), 60 (05/21/2008)  LDL:127 (08/15/2008), 91 (81/19/1478)  Chol:244 (08/15/2008), 190 (05/21/2008)  Trig:233 (08/15/2008), 301 (05/21/2008) add lipids and TSH with labs per pt request;  Pt to continue diet efforts, good med tolerance; to check labs - goal LDL less than 70   Complete Medication List: 1)  Fish Oil 1000 Mg Caps (Omega-3 fatty acids) .... Take 2 by mouth qd 2)  Aspirin 81 Mg Tabs (Aspirin) .... Once daily 3)  Vitamin D 1000 Unit Tabs (Cholecalciferol) .... Take 1 tablet by mouth four times a day 4)  Chromium 200 Mcg Tabs (Chromium)  .... Once daily 5)  Glyburide 5 Mg Tabs (Glyburide) .... Take 1 by mouth qd 6)  Benicar 40 Mg Tabs (Olmesartan medoxomil) .[name].. 1po once daily 7)  Requip 4 Mg Tabs (Ropinirole hcl) .... Take 1 by mouth qd 8)  Calcium-vitamin D [phone] Mg-unit Tabs (calcium-vitamin D)  .... Once daily 9)  Vitamin E 1000 Unit Caps (Vitamin e) .... Once daily 10)  Vitamin B-12 1000 Mcg Tabs (Cyanocobalamin) .... Once daily 11)  Coq10 30 Mg Caps (Coenzyme q10) .... Once daily 12)  Ra Omega 3-6-9 Caps (Misc natural products) .... Once daily 13)  Niacin Cr 500 Mg Cr-caps (Niacin) .[name].. 1 by mouth at bedtime 14)  Metformin Hcl 500 Mg Tabs (Metformin hcl) .... Take 1 tablet by mouth two times a day 15)  Furosemide 40 Mg Tabs (Furosemide) .[name].. 1 by mouth two times a day 16)  Potassium Chloride 20 Meq Pack (Potassium chloride) .[name].. 1 by mouth once daily  Patient Instructions: 1)  stop the losartan /hct 2)  stop the amlodipine for now (although it might need to be re-started later) 3)  start the benicar 40 mg per day 4)  increase the generic lasix to 40 mg twice per day 5)  increase the potassium pill to one per day 6)  please only drink fluids if you are thirsty 7)  please keep your appt with Dr McLean/Cardiology Monday apr 11 8)  please come to the lab at [facility] OR the lab at the [facility] early Monday AM if you can, to have : 9)  BMET 429.9 10)  TSH  780.79 11)  LIPIDS 272.0 12)  BNP:  429.9 13)  Please schedule an appointment with your primary doctor in 6 wks Prescriptions: POTASSIUM CHLORIDE 20 MEQ PACK (POTASSIUM CHLORIDE) 1 by mouth once daily  #30 x 11   Entered and Authorized by:   Corwin Levins MD   Signed by:   Corwin Levins MD on 05/28/2009   Method used:   Electronically to        [facility] [address] (  retail)       [address]       [place], [place]  [address]       Ph: [phone] or [phone]       Fax: [phone]   RxID:    [id number] FUROSEMIDE 40 MG TABS (FUROSEMIDE) 1 by mouth two times a day  #60 x 11   Entered and Authorized by:   Corwin Levins MD   Signed by:   Corwin Levins MD on 05/28/2009   Method used:   Electronically to        [facility] [address] (retail)       [address]       [place], [place]  [address]       Ph: [phone] or [phone]       Fax: [phone]   RxID:   [id number] BENICAR 40 MG TABS (OLMESARTAN MEDOXOMIL) 1po once daily  #30 x 11   Entered and Authorized by:   Corwin Levins MD   Signed by:   Corwin Levins MD on 05/28/2009   Method used:   Electronically to        [facility] [address] (retail)       [address]       [place], [place]  [address]       Ph: [phone] or [phone]       Fax: [phone]   RxID:   [id number]

## 2010-03-25 NOTE — Progress Notes (Signed)
Summary: Benicar pa  Phone Note From Pharmacy   Summary of Call: PA request--Benicar. The preferred alternatives are Losartan, Diovan, and Micardis. Please advise. Initial call taken by: Lucious Groves,  June 01, 2009 9:14 AM  Follow-up for Phone Call        ok to change to diovan 160mg  once daily - done - thx Follow-up by: Newt Lukes MD,  June 01, 2009 10:00 AM  Additional Follow-up for Phone Call Additional follow up Details #1::        left message on voicemail to call back to office.  Additional Follow-up by: Lucious Groves,  June 01, 2009 4:35 PM    Additional Follow-up for Phone Call Additional follow up Details #2::    pt informed Follow-up by: Margaret Pyle, CMA,  June 02, 2009 8:54 AM  New/Updated Medications: DIOVAN 160 MG TABS (VALSARTAN) 1 by mouth once daily Prescriptions: DIOVAN 160 MG TABS (VALSARTAN) 1 by mouth once daily  #30 x 5   Entered and Authorized by:   Newt Lukes MD   Signed by:   Newt Lukes MD on 06/01/2009   Method used:   Electronically to        [facility] [address] (retail)       [address]       [place], [place]  [address]       Ph: [phone] or [phone]       Fax: [phone]   RxID:   [id number]

## 2010-03-25 NOTE — Assessment & Plan Note (Signed)
Summary: 3 MTH FU  STC   Vital Signs:  Patient profile:   65 year old female Height:      61 inches (154.94 cm) Weight:      232.0 pounds (105.45 kg) O2 Sat:      95 % on Room air Temp:     97.6 degrees F (36.44 degrees C) oral Pulse rate:   72 / minute BP sitting:   116 / 68  (left arm) Cuff size:   large  Vitals Entered By: Orlan Leavens RMA (November 02, 2009 10:56 AM)  O2 Flow:  Room air CC: 3 month follow-up Is Patient Diabetic? Yes Did you bring your meter with you today? No Pain Assessment Patient in pain? no        Primary Care Provider:  Newt Lukes MD  CC:  3 month follow-up.  History of Present Illness:  here for follow up -  1) DM2 - now on combo glyburide + insulin  tx - has meet with nutritionist and working on weight loss, does not wish to follow with endo for same - home cbgs reviewed - no hypoglycemia events or symptoms   2) HTN -reports compliance with ongoing medical treatment and no changes in medication dose or frequency. denies adverse side effects related to current therapy. no edema, no CP or HA  3) dyslipidemia - intol of statins with adv rxn in past but tol current meds well -  4) c/o poor sleep - exac by stress of mother living with her, intol of ambein in past - no daytime sleepiness symptoms - rx for amitripline 6/2011and uses as needed - improved symptoms    Clinical Review Panels:  Lipid Management   Cholesterol:  308 (05/29/2009)   LDL (bad choesterol):  127 (08/15/2008)   HDL (good cholesterol):  58.30 (05/29/2009)   Triglycerides:  233 (08/15/2008)  Diabetes Management   HgBA1C:  7.8 (07/06/2009)   Creatinine:  1.0 (06/22/2009)   Last Flu Vaccine:  Historical (02/20/2009)   Last Pneumovax:  Historical (02/21/2006)   Current Medications (verified): 1)  Aspirin 81 Mg Tabs (Aspirin) .... Once Daily 2)  Vitamin D 1000 Unit Tabs (Cholecalciferol) .... Take 1 Tablet By Mouth A Day 3)  Diovan 160 Mg Tabs (Valsartan) .[name].. 1  By Mouth Once Daily 4)  Requip 4 Mg Tabs (Ropinirole Hcl) .... Take 1 By Mouth Qd 5)  Calcium-Vitamin D [phone] Mg-Unit Tabs (Calcium-Vitamin D) .... Once Daily 6)  Vitamin E 1000 Unit Caps (Vitamin E) .... Once Daily 7)  Vitamin B-12 1000 Mcg Tabs (Cyanocobalamin) .... Once Daily 8)  Coq10 30 Mg Caps (Coenzyme Q10) .... Once Daily 9)  Ra Omega 3-6-9  Caps (Misc Natural Products) .... Once Daily 10)  Niaspan 1000 Mg Cr-Tabs (Niacin (Antihyperlipidemic)) .... One Tablet At Bedtime 11)  Coreg 12.5 Mg Tabs (Carvedilol) .... One and One-Half  Tablets  Twice A Day 12)  Lovaza 1 Gm Caps (Omega-3-Acid Ethyl Esters) .... Two Twice Daily 13)  Fenofibrate 160 Mg Tabs (Fenofibrate) .... Take 1 By Mouth Qd 14)  Lasix 40 Mg Tabs (Furosemide) .... Take Two Tablets in The Morning and One Tablet in The Evening 15)  Humalog Kwikpen 100 Unit/ml Soln (Insulin Lispro (Human)) .... 5 Units Three Times A Day (Just Before Each Meal), and Pen Needles Three Times A Day 16)  Freestyle Lite Test  Strp (Glucose Blood) .... Use As Directed Three Times A Day 17)  Amitriptyline Hcl 10 Mg Tabs (Amitriptyline Hcl) .Marland KitchenMarland KitchenMarland Kitchen  1 By Mouth At Bedtime-Rarely Uses  Allergies (verified): 1)  ! Sulfa 2)  Morphine  Past History:  Past Medical History: 1. Diabetes mellitus, type II 2. Hyperlipidemia: She has been unable to tolerate statins (has been on Vytorin, lovastatin, and Crestor) due to what sounds like rhabdomyolysis: developed muscle weakness and was told she had "muscle damage" with each of these statins. She has been told not to take statins anymore.  3. Hypertension: prev was told she had "white coat" HTN  4. Obesity 5. Restless Leg Syndrome siince age 80yo 6. OA  7. peripheral neuropathy 8. OSA: Mild, does not use CPAP 9. Chronic nausea/diarrhea: ? IBS 10.  Diastolic CHF: Echo (3/11) with normal LV size, moderate LVH, EF 65-70%, LV mid-cavity gradient reaching 63 mmHg with valsalva  but minimal at rest, grade I  diastolic dysfunction, cannot estimate PA systolic pressure (no TR doppler signal), RV normal.  RHC (4/11): Mean RA 11, RV 37/13, PA 33/15, mean PCWP 12, CI 3.3.  11. ETT-myoview (4/11): 5'30", stopped due to fatigue, normal EF, no evidence for scar or ischemia.  MD roster: Erline Hau Vassie Loll GI - Gessner endo - Everardo All) Nsurg - Elsner gyn - susane miller cards- Shirlee Latch  Review of Systems  The patient denies weight loss, syncope, headaches, and abdominal pain.    Physical Exam  General:  overweight-appearing.  alert, well-developed, well-nourished, and cooperative to examination.    Lungs:  normal respiratory effort, no intercostal retractions or use of accessory muscles; normal breath sounds bilaterally - no crackles and no wheezes.    Heart:  normal rate, regular rhythm, no murmur, and no rub. BLE without edema.  Psych:  Oriented X3, memory intact for recent and remote, normally interactive, good eye contact, not anxious appearing, not depressed appearing, and not agitated.      Impression & Recommendations:  Problem # 1:  DIABETES MELLITUS, TYPE II (ICD-250.00)  check a1c - if doing well, will cont same med combo pt does not wish to return to endo or nutritionist - if a1c improved, will manage here as ongoing Her updated medication list for this problem includes:    Aspirin 81 Mg Tabs (Aspirin) ..... Once daily    Diovan 160 Mg Tabs (Valsartan) .[name]... 1 by mouth once daily    Humalog Kwikpen 100 Unit/ml Soln (Insulin lispro (human)) .Marland KitchenMarland KitchenMarland KitchenMarland Kitchen 5 units three times a day (just before each meal), and pen needles three times a day    Glyburide 5 Mg Tabs (Glyburide) .[name]... 1 by mouth two times a day  Labs Reviewed: Creat: 1.0 (06/22/2009)    Reviewed HgBA1c results: 7.8 (07/06/2009)  7.6 (02/24/2009)  Orders: TLB-A1C / Hgb A1C (Glycohemoglobin) (83036-A1C)  Problem # 2:  HYPERLIPIDEMIA (ICD-272.4)  Her updated medication list for this problem includes:    Niaspan 1000 Mg Cr-tabs (Niacin  (antihyperlipidemic)) ..... One tablet at bedtime    Lovaza 1 Gm Caps (Omega-3-acid ethyl esters) .[name]..[name] Two twice daily    Fenofibrate 160 Mg Tabs (Fenofibrate) .[name]... Take 1 by mouth qd  intol of statins, hx myopathy/rhabdo -  FLP and LFts per cards -  Labs Reviewed: SGOT: 25 (04/29/2009)   SGPT: 21 (04/29/2009)   HDL:58.30 (05/29/2009), 62 (08/15/2008)  LDL:127 (08/15/2008), 91 (16/11/9602)  Chol:308 (05/29/2009), 244 (08/15/2008)  Trig:539.0 (05/29/2009), 233 (08/15/2008)  Problem # 3:  HYPERTENSION (ICD-401.9) Assessment: Improved  Her updated medication list for this problem includes:    Diovan 160 Mg Tabs (Valsartan) .[name]... 1 by mouth once daily  Coreg 12.5 Mg Tabs (Carvedilol) ..... One and one-half  tablets  twice a day    Lasix 40 Mg Tabs (Furosemide) .[name]... Take two tablets in the morning and one tablet in the evening  BP today: 116/68 Prior BP: 142/72 (10/29/2009)  Labs Reviewed: K+: 3.9 (06/22/2009) Creat: : 1.0 (06/22/2009)   Chol: 308 (05/29/2009)   HDL: 58.30 (05/29/2009)   LDL: 127 (08/15/2008)   TG: 539.0 (05/29/2009)  Complete Medication List: 1)  Aspirin 81 Mg Tabs (Aspirin) .... Once daily 2)  Vitamin D 1000 Unit Tabs (Cholecalciferol) .... Take 1 tablet by mouth a day 3)  Diovan 160 Mg Tabs (Valsartan) .[name].. 1 by mouth once daily 4)  Requip 4 Mg Tabs (Ropinirole hcl) .... Take 1 by mouth qd 5)  Calcium-vitamin D [phone] Mg-unit Tabs (calcium-vitamin D)  .... Once daily 6)  Vitamin E 1000 Unit Caps (Vitamin e) .... Once daily 7)  Vitamin B-12 1000 Mcg Tabs (Cyanocobalamin) .... Once daily 8)  Coq10 30 Mg Caps (Coenzyme q10) .... Once daily 9)  Ra Omega 3-6-9 Caps (Misc natural products) .... Once daily 10)  Niaspan 1000 Mg Cr-tabs (Niacin (antihyperlipidemic)) .... One tablet at bedtime 11)  Coreg 12.5 Mg Tabs (Carvedilol) .... One and one-half  tablets  twice a day 12)  Lovaza 1 Gm Caps (Omega-3-acid ethyl esters) .... Two twice daily 13)  Fenofibrate 160 Mg  Tabs (Fenofibrate) .... Take 1 by mouth qd 14)  Lasix 40 Mg Tabs (Furosemide) .... Take two tablets in the morning and one tablet in the evening 15)  Humalog Kwikpen 100 Unit/ml Soln (Insulin lispro (human)) .... 5 units three times a day (just before each meal), and pen needles three times a day 16)  Freestyle Lite Test Strp (Glucose blood) .... Use as directed three times a day 17)  Amitriptyline Hcl 10 Mg Tabs (Amitriptyline hcl) .[name].. 1 by mouth at bedtime-rarely uses 18)  Glyburide 5 Mg Tabs (Glyburide) .[name].. 1 by mouth two times a day  Patient Instructions: 1)  it was good to see you today.  2)  return for test(s) in AM when fasting - your results will be posted on the phone tree for review in 48-72 hours from the time of test completion; call [phone] and enter your 9 digit MRN (listed above on this page, just below your name); if any changes need to be made or there are abnormal results, you will be contacted directly.  3)  no medicine changes recomended today - keep doing what you are doing and keep up the good work! 4)  Please schedule a follow-up appointment in 3 months to check diabetes labs, call sooner if problems.

## 2010-03-25 NOTE — Procedures (Signed)
Summary: Colonoscopy/[facility]  Colonoscopy/[facility]   Imported By: Sherian Rein 05/25/2009 08:30:20  _____________________________________________________________________  External Attachment:    Type:   Image     Comment:   External Document

## 2010-03-25 NOTE — Progress Notes (Signed)
Summary: Nuclear Pre-Procedure  Phone Note Outgoing Call   Call placed by: Milana Na, EMT-P,  June 03, 2009 4:04 PM Summary of Call: Reviewed information on Myoview Information Sheet (see scanned document for further details).  Spoke with patient.     Nuclear Med Background Indications for Stress Test: Evaluation for Ischemia   History: Echo  History Comments: 05/05/09 ECHO Mod LVH EF 65-70% Diastolic Dysfunction  Symptoms: SOB  Symptoms Comments: Bilateral Ankle Edema   Nuclear Pre-Procedure Cardiac Risk Factors: Hypertension, Lipids, NIDDM Height (in): 61  Nuclear Med Study Referring MD:  D.McLean

## 2010-03-25 NOTE — Assessment & Plan Note (Signed)
Summary: 4 WK FU  STC   Vital Signs:  Patient profile:   65 year old female Height:      61 inches (154.94 cm) Weight:      224.0 pounds (101.82 kg) O2 Sat:      94 % on Room air Temp:     98.4 degrees F (36.89 degrees C) oral Pulse rate:   79 / minute BP sitting:   130 / 62  (left arm) Cuff size:   large  Vitals Entered By: Orlan Leavens (June 15, 2009 10:32 AM)  O2 Flow:  Room air CC: 4 week follow-up Is Patient Diabetic? Yes Did you bring your meter with you today? No Pain Assessment Patient in pain? no        Primary Care Provider:  Newt Lukes MD  CC:  4 week follow-up.  History of Present Illness: here for 4 week f/u  HTN- since last OV here, changed amlodipine to different ARB because of edema - also on lasix edema and SOB much improved  diast dysfx- also has seen cards for same - s/p nuc stress test and now for cardiac cath tomorrow to further eval abn no CP  low back pain - managing same wit ibuprofen and bengay rub - due to see Nsurg tomorrow but will be in hosp for card cath so plans to reschedule   Current Medications (verified): 1)  Aspirin 81 Mg Tabs (Aspirin) .... Once Daily 2)  Vitamin D 1000 Unit Tabs (Cholecalciferol) .... Take 1 Tablet By Mouth Four Times A Day 3)  Glyburide 5 Mg Tabs (Glyburide) .... Take 1 By Mouth Qd 4)  Diovan 160 Mg Tabs (Valsartan) .[name].. 1 By Mouth Once Daily 5)  Requip 4 Mg Tabs (Ropinirole Hcl) .... Take 1 By Mouth Qd 6)  Calcium-Vitamin D [phone] Mg-Unit Tabs (Calcium-Vitamin D) .... Once Daily 7)  Vitamin E 1000 Unit Caps (Vitamin E) .... Once Daily 8)  Vitamin B-12 1000 Mcg Tabs (Cyanocobalamin) .... Once Daily 9)  Coq10 30 Mg Caps (Coenzyme Q10) .... Once Daily 10)  Ra Omega 3-6-9  Caps (Misc Natural Products) .... Once Daily 11)  Niaspan 1000 Mg Cr-Tabs (Niacin (Antihyperlipidemic)) .... One Tablet At Bedtime 12)  Metformin Hcl 500 Mg Tabs (Metformin Hcl) .... Take 1 Tablet By Mouth Two Times A Day 13)   Coreg 6.25 Mg Tabs (Carvedilol) .... One Tablet Twice A Day 14)  Lovaza 1 Gm Caps (Omega-3-Acid Ethyl Esters) .... Two Twice Daily 15)  Fenofibrate 160 Mg Tabs (Fenofibrate) .... Take 1 By Mouth Qd 16)  Lasix 40 Mg Tabs (Furosemide) .... Take 1 Two Times A Day  Allergies (verified): 1)  ! Sulfa 2)  Morphine  Past History:  Past Medical History: 1. Diabetes mellitus, type II 2. Hyperlipidemia: She has been unable to tolerate statins (has been on Vytorin, lovastatin, and Crestor) due to what sounds like rhabdomyolysis.  She developed muscle weakness and then was told she had "muscle damage" with each of these statins. She has been told not to take statins anymore.  3. Hypertension: On BP meds for 3-4 years.  Was hypertensive before this but was told she had "white coat" HTN.   4. Obesity 5. Restless Leg Syndrome siince age 51yo 6. OA  7. peripheral neuropathy 8. OSA: Mild, does not use CPAP 9. Chronic nausea/diarrhea: ? IBS 10.  Diastolic CHF: Echo (3/11) with normal LV size, moderate LVH, EF 65-70%, LV mid-cavity gradient reaching 63 mmHg with valsalva  but minimal  at rest, grade I diastolic dysfunction, cannot estimate PA systolic pressure (no TR doppler signal), RV normal.   MD rooster: pulm -[facility] GI - Gessner endo - Ellison Nsurg - elsner cards- [place]ton  Review of Systems  The patient denies anorexia, fever, weight gain, syncope, dyspnea on exertion, and headaches.    Physical Exam  General:  alert and overweight-appearing.  NAD Lungs:  normal respiratory effort, no intercostal retractions or use of accessory muscles; normal breath sounds bilaterally - no crackles and no wheezes.    Heart:  normal rate, regular rhythm, no murmur, and no rub. BLE with trace edema to mid calf.    Impression & Recommendations:  Problem # 1:  CHRONIC DIASTOLIC HEART FAILURE (ICD-428.32)  Her updated medication list for this problem includes:    Aspirin 81 Mg Tabs (Aspirin) ..... Once  daily    Diovan 160 Mg Tabs (Valsartan) .[name]... 1 by mouth once daily    Coreg 6.25 Mg Tabs (Carvedilol) ..... One tablet twice a day    Lasix 40 Mg Tabs (Furosemide) .[name]... Take 1 two times a day  per 06/01/09 cards note: LV hypertrophy is likely do to longstanding HTN.  The LV mid-cavity gradient may be due to vigorous contraction of the LV in the setting of signifcant LVH.  BNP is not high but could be falsely low due to obesity.  Plan for this as per above. --card cath planned 4/26  Problem # 2:  HYPERTENSION (ICD-401.9) edema improved off amlodipine and on lasix - cont same Her updated medication list for this problem includes:    Diovan 160 Mg Tabs (Valsartan) .[name]... 1 by mouth once daily    Coreg 6.25 Mg Tabs (Carvedilol) ..... One tablet twice a day    Lasix 40 Mg Tabs (Furosemide) .[name]... Take 1 two times a day  BP today: 130/62 Prior BP: 158/88 (06/01/2009)  Labs Reviewed: K+: 4.2 (05/29/2009) Creat: : 0.5 (05/29/2009)   Chol: 308 (05/29/2009)   HDL: 58.30 (05/29/2009)   LDL: 127 (08/15/2008)   TG: 539.0 (05/29/2009)  Problem # 3:  HYPERLIPIDEMIA (ICD-272.4)  new addede meds reviewed - no statin due to hx rhabdo.. Her updated medication list for this problem includes:    Niaspan 1000 Mg Cr-tabs (Niacin (antihyperlipidemic)) ..... One tablet at bedtime    Lovaza 1 Gm Caps (Omega-3-acid ethyl esters) .[name]..[name] Two twice daily    Fenofibrate 160 Mg Tabs (Fenofibrate) .[name]... Take 1 by mouth qd  Labs Reviewed: SGOT: 25 (04/29/2009)   SGPT: 21 (04/29/2009)   HDL:58.30 (05/29/2009), 62 (08/15/2008)  LDL:127 (08/15/2008), 91 (16/11/9602)  Chol:308 (05/29/2009), 244 (08/15/2008)  Trig:539.0 (05/29/2009), 233 (08/15/2008)  Problem # 4:  DIABETES MELLITUS, TYPE II (ICD-250.00)  Her updated medication list for this problem includes:    Aspirin 81 Mg Tabs (Aspirin) ..... Once daily    Glyburide 5 Mg Tabs (Glyburide) .[name]... Take 1 by mouth qd    Diovan 160 Mg Tabs (Valsartan) .[name]... 1 by mouth  once daily    Metformin Hcl 500 Mg Tabs (Metformin hcl) .[name]... Take 1 tablet by mouth two times a day  Labs Reviewed: Creat: 0.5 (05/29/2009)    Reviewed HgBA1c results: 7.6 (02/24/2009)  6.8 (11/24/2008)  Problem # 5:  DIARRHEA (ICD-787.91) s/p GI eval -  despite neg labs fro celiac dz, changed to gluten free diet and doing well - formed stools- cont same   Time spent with patient 25 minutes, more than 50% of this time was spent reviewing interval hx, lab/test  results and med changes  Complete Medication List: 1)  Aspirin 81 Mg Tabs (Aspirin) .... Once daily 2)  Vitamin D 1000 Unit Tabs (Cholecalciferol) .... Take 1 tablet by mouth four times a day 3)  Glyburide 5 Mg Tabs (Glyburide) .... Take 1 by mouth qd 4)  Diovan 160 Mg Tabs (Valsartan) .[name].. 1 by mouth once daily 5)  Requip 4 Mg Tabs (Ropinirole hcl) .... Take 1 by mouth qd 6)  Calcium-vitamin D [phone] Mg-unit Tabs (calcium-vitamin D)  .... Once daily 7)  Vitamin E 1000 Unit Caps (Vitamin e) .... Once daily 8)  Vitamin B-12 1000 Mcg Tabs (Cyanocobalamin) .... Once daily 9)  Coq10 30 Mg Caps (Coenzyme q10) .... Once daily 10)  Ra Omega 3-6-9 Caps (Misc natural products) .... Once daily 11)  Niaspan 1000 Mg Cr-tabs (Niacin (antihyperlipidemic)) .... One tablet at bedtime 12)  Metformin Hcl 500 Mg Tabs (Metformin hcl) .... Take 1 tablet by mouth two times a day 13)  Coreg 6.25 Mg Tabs (Carvedilol) .... One tablet twice a day 14)  Lovaza 1 Gm Caps (Omega-3-acid ethyl esters) .... Two twice daily 15)  Fenofibrate 160 Mg Tabs (Fenofibrate) .... Take 1 by mouth qd 16)  Lasix 40 Mg Tabs (Furosemide) .... Take 1 two times a day  Patient Instructions: 1)  it was good to see you today.  2)  interval history reviewed - no new changes from me today - 3)  Please schedule a follow-up appointment in 6-8 weeks, sooner if problems.

## 2010-03-25 NOTE — Assessment & Plan Note (Signed)
Summary: f41m/dm   Referring Provider:  Newt Lukes MD Primary Provider:  Newt Lukes MD  CC:  f67m.  Pt states she is still SOB.  She went to see a dietician and has started using insulin as well.  She has not lost weight but gained it.  She is stil retaining fluid too she believes.  History of Present Illness: 65 yo with history of HTN, DM, hyperlipidemia, and chronic dyspnea returns for cardiac evaluation.  Patient had an echo showing moderate LVH and preserved LV systolic function.  However, there was a very large LV mid-cavity gradient with valsalva. Starting about 10 months ago, patient developed quite significant exertional dyspnea to the point where she was short of breath walking around her house.  She has had a pulmonary evaluation with Dr. Vassie Loll but no primary lung problems have been identified.    Patient had an ETT-myoview which was negative for ischemia or infarction.  RIght heart cath showed mildly elevated right heart filling pressures but normal PA pressure and normal PCWP.  I started her on a beta blocker (Coreg) to try to lower her LV mid-cavity gradient (that likely occurs with exertion) and to better control BP.  She initially felt better after starting Coreg but developed more dyspnea over the summer.  She can walk about 1/4 mile now but is very short of breath after this distance.  She has gained 9 lbs since I last saw her.  No PND.  No chest pain.  She is trying to watch her sodium intake.   Labs (4/11): TGs 539, HDL 58, LDL 128, K 5.2, creatinine 1.3, TSH normal, BNP 14 Labs (5/11): K 3.9, creatinine 1.0  Current Medications (verified): 1)  Aspirin 81 Mg Tabs (Aspirin) .... Once Daily 2)  Vitamin D 1000 Unit Tabs (Cholecalciferol) .... Take 1 Tablet By Mouth A Day 3)  Diovan 160 Mg Tabs (Valsartan) .[name].. 1 By Mouth Once Daily 4)  Requip 4 Mg Tabs (Ropinirole Hcl) .... Take 1 By Mouth Qd 5)  Calcium-Vitamin D [phone] Mg-Unit Tabs (Calcium-Vitamin D) .... Once  Daily 6)  Vitamin E 1000 Unit Caps (Vitamin E) .... Once Daily 7)  Vitamin B-12 1000 Mcg Tabs (Cyanocobalamin) .... Once Daily 8)  Coq10 30 Mg Caps (Coenzyme Q10) .... Once Daily 9)  Ra Omega 3-6-9  Caps (Misc Natural Products) .... Once Daily 10)  Niaspan 1000 Mg Cr-Tabs (Niacin (Antihyperlipidemic)) .... One Tablet At Bedtime 11)  Coreg 12.5 Mg Tabs (Carvedilol) .... One Tablet Twice A Day 12)  Lovaza 1 Gm Caps (Omega-3-Acid Ethyl Esters) .... Two Twice Daily 13)  Fenofibrate 160 Mg Tabs (Fenofibrate) .... Take 1 By Mouth Qd 14)  Lasix 40 Mg Tabs (Furosemide) .... Take 1 Two Times A Day 15)  Humalog Kwikpen 100 Unit/ml Soln (Insulin Lispro (Human)) .... 5 Units Three Times A Day (Just Before Each Meal), and Pen Needles Three Times A Day 16)  Freestyle Lite Test  Strp (Glucose Blood) .... Use As Directed Three Times A Day 17)  Amitriptyline Hcl 10 Mg Tabs (Amitriptyline Hcl) .[name].. 1 By Mouth At Bedtime-Rarely Uses  Allergies (verified): 1)  ! Sulfa 2)  Morphine  Past History:  Past Medical History: Reviewed history from 08/03/2009 and no changes required. 1. Diabetes mellitus, type II 2. Hyperlipidemia: She has been unable to tolerate statins (has been on Vytorin, lovastatin, and Crestor) due to what sounds like rhabdomyolysis.  She developed muscle weakness and then was told she had "muscle damage" with  each of these statins. She has been told not to take statins anymore.  3. Hypertension: On BP meds for 3-4 years.  Was hypertensive before this but was told she had "white coat" HTN.   4. Obesity 5. Restless Leg Syndrome siince age 13yo 6. OA  7. peripheral neuropathy 8. OSA: Mild, does not use CPAP 9. Chronic nausea/diarrhea: ? IBS 10.  Diastolic CHF: Echo (3/11) with normal LV size, moderate LVH, EF 65-70%, LV mid-cavity gradient reaching 63 mmHg with valsalva  but minimal at rest, grade I diastolic dysfunction, cannot estimate PA systolic pressure (no TR doppler signal), RV normal.   RHC (4/11): Mean RA 11, RV 37/13, PA 33/15, mean PCWP 12, CI 3.3.  11. ETT-myoview (4/11): 5'30", stopped due to fatigue, normal EF, no evidence for scar or ischemia.   MD roster: Erline Hau Vassie Loll GI - [facility] endo Everardo All Nsurg - Elsner gyn - susane miller cards- Shirlee Latch  Family History: Reviewed history from 07/06/2009 and no changes required. Family History of Alcoholism/Addiction (parent) Family History Diabetes 1st degree relative (grandparent) Family History High cholesterol (parent, grandparent) Family History Hypertension (parent, grandparent) Family History Lung cancer (grandparent) Stomach cancer (grandmother) Celiac Sprue  daughter Mother with MI at 38, father with MI at 38, uncle with MI at 75, brother with stents in his 74s, multiple aunts with MIs and CVAs  Social History: Reviewed history from 05/22/2009 and no changes required. 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   Vital Signs:  Patient profile:   65 year old female Height:      61 inches Weight:      234 pounds BMI:     44.37 Pulse rate:   84 / minute Pulse rhythm:   regular BP sitting:   142 / 72  (left arm) Cuff size:   large  Vitals Entered By: Judithe Modest CMA (October 29, 2009 2:34 PM)  Physical Exam  General:  Well developed, well nourished, in no acute distress. Obese.  Neck:  Neck thick, JVP 10+ cm. No masses, thyromegaly or abnormal cervical nodes. Lungs:  Clear bilaterally to auscultation and percussion. Heart:  Non-displaced PMI, chest non-tender; regular rate and rhythm, S1, S2 without rubs or gallops. 2/6 systolic crescendo-decrescendo murmur along the sternal border.  Carotid upstroke normal, no bruit. Pedals normal pulses. 1+ edema 1/2 up lower legs bilaterally.  Abdomen:  Bowel sounds positive; abdomen  soft and non-tender without masses, organomegaly, or hernias noted. No hepatosplenomegaly. Extremities:  No clubbing or cyanosis. Neurologic:  Alert and oriented x 3. Psych:  Normal affect.   Impression & Recommendations:  Problem # 1:  CHRONIC DIASTOLIC HEART FAILURE (ICD-428.32) LV hypertrophy is likely due to longstanding HTN.  The LV mid-cavity gradient may be due to vigorous contraction of the LV in the setting of significant LVH.  BNP has not been high but could be falsely low due to obesity. Right heart cath showed mildly elevated right heart filling pressures but no pulmonary hypertension and normal PCWP.  Patient is more short of breath than when I last saw her and does appear volume overloaded with edema and elevated neck veins.  Beta blockade can help by lowering the LV mid-cavity gradient (relaxing the heart), leading to lower filling pressures.  I will increase Coreg to  18.75 mg two times a day.  I will also increase Lasix to 80 mg qam, 40 mg qpm and add KCl 20 mEq daily.  Follow low sodium diet.  BMET in 1 week.  Followup with me in 10-14 days.    Problem # 2:  HYPERLIPIDEMIA (ICD-272.4) Pt is due for lipids/LFTs.  I will check.   Problem # 3:  HYPERTENSION (ICD-401.9) BP mildly elevated, increase Coreg as above.   Problem # 4:  OBESITY (ICD-278.00) I have encouraged her to watch her diet and try to get more exercise.   Patient Instructions: 1)  Your physician has recommended you make the following change in your medication:  2)  Increase Coreg(carvedilol) to 18.75mg  twice a day--this will be one and one-half of a 12.5mg  tablet twice a day. 3)  Increase Lasix(furosemide) to 80mg  in the morning and 40mg  in the evening--this will be two 40mg  tablets in the morning and one 40mg  tablet in the evening. 4)  Your physician recommends that you return for a FASTING lipid profile/liver profile/BMP in 1 week--428.32 401.9 5)  Your physician recommends that you schedule a follow-up  appointment in: 10-14 days with Dr Shirlee Latch. Prescriptions: LASIX 40 MG TABS (FUROSEMIDE) take two tablets in the morning and one tablet in the evening  #90 x 6   Entered by:   Katina Dung, RN, BSN   Authorized by:   Marca Ancona, MD   Signed by:   Katina Dung, RN, BSN on 10/29/2009   Method used:   Electronically to        [facility] (retail)       [address]       [place], [place]  [address]       Ph: [phone] or [phone]       Fax: [phone]   RxID:   [id number] COREG 12.5 MG TABS (CARVEDILOL) one and one-half  tablets  twice a day  #90 x 11   Entered by:   Katina Dung, RN, BSN   Authorized by:   Marca Ancona, MD   Signed by:   Katina Dung, RN, BSN on 10/29/2009   Method used:   Electronically to        [facility] (retail)       [address]       [place], [place]  [address]       Ph: [phone] or [phone]       Fax: [phone]   RxID:   [id number]

## 2010-03-25 NOTE — Progress Notes (Signed)
Summary: problem with med  Phone Note Call from Patient   Caller: Patient [phone] Reason for Call: Talk to Nurse Summary of Call: pt taking niaspan 1000 mg 2 times a day-having itching and burning-took an aspirin before but not helping-has happened before while on 1000 mg but worse now that it's doubled-pls advise Initial call taken by: Glynda Jaeger,  November 16, 2009 10:31 AM  Follow-up for Phone Call        talked with pt by telephone--she has itching and burning all over since increasing Niaspan to 2000mg  nightly--pt asking if OK to take 1000mg  bid--reviewed with Dr Wilmon Pali to take 1000mg  two times a day or pt may try to take 1500mg  at bedtime for 1-2 weeks and if she tolerates this she will try to increase to 2000mg  --pt did take her Aspirin before taking Niaspan--she will also try to take with unsweetened applesauce to see if that helps

## 2010-03-25 NOTE — Letter (Signed)
Summary: [facility]   Imported By: Sherian Rein 05/25/2009 08:31:32  _____________________________________________________________________  External Attachment:    Type:   Image     Comment:   External Document

## 2010-03-25 NOTE — Assessment & Plan Note (Signed)
Summary: 4-6 WK FU  STC   Vital Signs:  Patient profile:   65 year old female Height:      63 inches (160.02 cm) Weight:      231.0 pounds (105.00 kg) O2 Sat:      94 % on Room air Temp:     97.5 degrees F (36.39 degrees C) oral Pulse rate:   100 / minute BP sitting:   166 / 68  (left arm) Cuff size:   large  Vitals Entered By: Orlan Leavens (May 18, 2009 9:12 AM)  O2 Flow:  Room air CC: 4 week f/u Is Patient Diabetic? No Pain Assessment Patient in pain? no        Primary Care Provider:  Newt Lukes MD  CC:  4 week f/u.  History of Present Illness: here for 4 week f/u to continue discussion of mutliple med issues: here for 4 week f/u-  1)  LBP/poor exercise tolerance -  MRI done 3/22 - forminal stenosis - to see NSurg for ?steroid shot to help same -  pt reluctant for steroids due to concern for ?wt gain SE - will try ibuprofen for same still with min weakness in both legs (R>L prox) - notices most with climbing action denies balance problems or falls -   2) DM2 -reports compliance with ongoing medical treatment and no changes in medication dose or frequency. denies adverse side effects related to current therapy. wants to switch to [facility]  3) HTN - has been running high - SBP170s despite meds. reports compliance with ongoing medical treatment and no changes in medication dose or frequency. denies adverse side effects related to current therapy. no CP, Ha or change in edema  4) dyslipidemia -  intol of statins due to ?rhabdo (describes recurrent muscle damage episodes) -  on niacin and omega oils - tol these ok at this point -  5) dyspena - sseeing pulm - [facility] for same - also ?OSA , sleep study done last PM - due for f/u with pulm this week-- reports family notes she stops breathing - also snoring and daytime fatigue  5) chronic nausea - a/w freq BMs: 10-12 BM/day - loose, small volume - no abd pain but "never right" - to see GI at end of this week     Clinical Review Panels:  Prevention   Last Mammogram:  No specific mammographic evidence of malignancy.  Assessment: BIRADS 1. Location: [facility].    (07/23/2007)   Last Pap Smear:  Interpretation/Result:Negative for intraepithelial Lesion or Malignancy.    (02/22/2008)   Last Colonoscopy:  Results: Hemorrhoids.     Location:  [facility].  recommend home hemoccult every 5 years and colonoscopy every 10 years (12/18/2001)  Immunizations   Last Tetanus Booster:  Historical (02/22/2004)   Last Flu Vaccine:  Historical (02/20/2009)   Last Pneumovax:  Historical (02/21/2006)  Lipid Management   Cholesterol:  244 (08/15/2008)   LDL (bad choesterol):  127 (08/15/2008)   HDL (good cholesterol):  62 (08/15/2008)   Triglycerides:  233 (08/15/2008)  Diabetes Management   HgBA1C:  7.6 (02/24/2009)   Creatinine:  0.6 (04/29/2009)   Last Flu Vaccine:  Historical (02/20/2009)   Last Pneumovax:  Historical (02/21/2006)  CBC   WBC:  8.8 (04/29/2009)   RBC:  4.47 (04/29/2009)   Hgb:  12.4 (04/29/2009)   Hct:  37.4 (04/29/2009)   Platelets:  283.0 (04/29/2009)   MCV  83.7 (04/29/2009)  MCHC  33.2 (04/29/2009)   RDW  14.1 (04/29/2009)   PMN:  62.7 (04/29/2009)   Lymphs:  28.7 (04/29/2009)   Monos:  5.9 (04/29/2009)   Eosinophils:  1.4 (04/29/2009)   Basophil:  1.3 (04/29/2009)  Complete Metabolic Panel   Glucose:  91 (04/29/2009)   Sodium:  144 (04/29/2009)   Potassium:  4.0 (04/29/2009)   Chloride:  108 (04/29/2009)   CO2:  31 (04/29/2009)   BUN:  15 (04/29/2009)   Creatinine:  0.6 (04/29/2009)   Albumin:  3.3 (04/29/2009)   Total Protein:  6.5 (04/29/2009)   Calcium:  8.9 (04/29/2009)   Total Bili:  0.4 (04/29/2009)   Alk Phos:  63 (04/29/2009)   SGPT (ALT):  21 (04/29/2009)   SGOT (AST):  25 (04/29/2009)   Current Medications (verified): 1)  Fish Oil 1000 Mg Caps (Omega-3 Fatty Acids) .... Take 2 By Mouth Qd 2)  Aspirin 81 Mg  Tabs (Aspirin) .... Once Daily 3)  Vitamin D 1000 Unit Tabs (Cholecalciferol) .... Take 1 Tablet By Mouth Four Times A Day 4)  Chromium 200 Mcg Tabs (Chromium) .... Once Daily 5)  Glyburide 5 Mg Tabs (Glyburide) .... Take 1 By Mouth Qd 6)  Losartan Potassium-Hctz 100-25 Mg Tabs (Losartan Potassium-Hctz) .... Take 1 By Mouth Qd 7)  Requip 4 Mg Tabs (Ropinirole Hcl) .... Take 1 By Mouth Qd 8)  Calcium-Vitamin D [phone] Mg-Unit Tabs (Calcium-Vitamin D) .... Once Daily 9)  Vitamin E 1000 Unit Caps (Vitamin E) .... Once Daily 10)  Vitamin B-12 1000 Mcg Tabs (Cyanocobalamin) .... Once Daily 11)  Coq10 30 Mg Caps (Coenzyme Q10) .... Once Daily 12)  Ra Omega 3-6-9  Caps (Misc Natural Products) .... Once Daily 13)  Niacin Cr 500 Mg Cr-Caps (Niacin) .[name].. 1 By Mouth At Bedtime 14)  Metformin Hcl 500 Mg Tabs (Metformin Hcl) .... Take 1 Tablet By Mouth Two Times A Day 15)  Ambien .... Take 1/2 Tab By Mouth At Bedtime As Needed (Pt Unsure of Strength) 16)  Furosemide 20 Mg Tabs (Furosemide) .... Take 1 Tablet By Mouth Once A Day X 2 Weeks 17)  Valium 5 Mg Tabs (Diazepam) .... Take Tab 1 Hour Before Mri  Allergies (verified): 1)  Morphine  Past History:  Past Medical History: Diabetes mellitus, type II Hyperlipidemia Hypertension obesity RLS since age 29yo OA  peripheral neuropathy OSA  MD rooster - pulm -[facility] - [facility] endo - [facility] - [facility]  Review of Systems       The patient complains of peripheral edema.  The patient denies anorexia, weight loss, chest pain, syncope, headaches, and abdominal pain.    Physical Exam  General:  overweight-appearing.  alert, well-developed, well-nourished, and cooperative to examination.    Lungs:  normal respiratory effort, no intercostal retractions or use of accessory muscles; normal breath sounds bilaterally - no crackles and no wheezes.    Heart:  normal rate, regular rhythm, no murmur, and no rub. BLE with chronic 1 + edema.   Neurologic:  mild prox quad weakness R>L - needs assist with upper body to raise out of chair - once standing, ambulates with gaurded but normal gait - no foot drop - alert & oriented X3 and cranial nerves II-XII symetrically intact.  sensation intact to light touch. speech fluent without dysarthria or aphasia; follows commands with good comprehension.    Impression & Recommendations:  Problem # 1:  HYPERTENSION (ICD-401.9)  needs inc tx - add amlodipne - reck 4 weeks Her  updated medication list for this problem includes:    Losartan Potassium-hctz 100-25 Mg Tabs (Losartan potassium-hctz) .[name]... Take 1 by mouth qd    Furosemide 20 Mg Tabs (Furosemide) .[name]... Take 1 tablet by mouth once a day x 2 weeks    Amlodipine Besylate 5 Mg Tabs (Amlodipine besylate) .[name]... 1 by mouth once daily  BP today: 166/68 Prior BP: 160/80 (04/29/2009)  Labs Reviewed: K+: 4.0 (04/29/2009) Creat: : 0.6 (04/29/2009)   Chol: 244 (08/15/2008)   HDL: 62 (08/15/2008)   LDL: 127 (08/15/2008)   TG: 233 (08/15/2008)  Orders: Prescription Created Electronically [phone])  Problem # 2:  DIABETES MELLITUS, TYPE II (ICD-250.00)  refer to endo at pt request -  Her updated medication list for this problem includes:    Aspirin 81 Mg Tabs (Aspirin) ..... Once daily    Glyburide 5 Mg Tabs (Glyburide) .[name]... Take 1 by mouth qd    Losartan Potassium-hctz 100-25 Mg Tabs (Losartan potassium-hctz) .[name]... Take 1 by mouth qd    Metformin Hcl 500 Mg Tabs (Metformin hcl) .[name]... Take 1 tablet by mouth two times a day  Labs Reviewed: Creat: 0.6 (04/29/2009)    Reviewed HgBA1c results: 7.6 (02/24/2009)  6.8 (11/24/2008)  Orders: Endocrinology Referral (Endocrine)  Problem # 3:  BACK PAIN, LUMBAR, CHRONIC (ICD-724.2) mgmt per Nsurg - pt uncertain about ESI - will consider depending on response to ibuprofen tx trial MRI report reviewed Her updated medication list for this problem includes:    Aspirin 81 Mg Tabs (Aspirin) .....  Once daily  Problem # 4:  OBSTRUCTIVE SLEEP APNEA (ICD-780.57) per pulm - to arrange f/u of sleep study this week  Complete Medication List: 1)  Fish Oil 1000 Mg Caps (Omega-3 fatty acids) .... Take 2 by mouth qd 2)  Aspirin 81 Mg Tabs (Aspirin) .... Once daily 3)  Vitamin D 1000 Unit Tabs (Cholecalciferol) .... Take 1 tablet by mouth four times a day 4)  Chromium 200 Mcg Tabs (Chromium) .... Once daily 5)  Glyburide 5 Mg Tabs (Glyburide) .... Take 1 by mouth qd 6)  Losartan Potassium-hctz 100-25 Mg Tabs (Losartan potassium-hctz) .... Take 1 by mouth qd 7)  Requip 4 Mg Tabs (Ropinirole hcl) .... Take 1 by mouth qd 8)  Calcium-vitamin D [phone] Mg-unit Tabs (calcium-vitamin D)  .... Once daily 9)  Vitamin E 1000 Unit Caps (Vitamin e) .... Once daily 10)  Vitamin B-12 1000 Mcg Tabs (Cyanocobalamin) .... Once daily 11)  Coq10 30 Mg Caps (Coenzyme q10) .... Once daily 12)  Ra Omega 3-6-9 Caps (Misc natural products) .... Once daily 13)  Niacin Cr 500 Mg Cr-caps (Niacin) .[name].. 1 by mouth at bedtime 14)  Metformin Hcl 500 Mg Tabs (Metformin hcl) .... Take 1 tablet by mouth two times a day 15)  Ambien  .... Take 1/2 tab by mouth at bedtime as needed (pt unsure of strength) 16)  Furosemide 20 Mg Tabs (Furosemide) .... Take 1 tablet by mouth once a day x 2 weeks 17)  Valium 5 Mg Tabs (Diazepam) .... Take tab 1 hour before mri 18)  Amlodipine Besylate 5 Mg Tabs (Amlodipine besylate) .[name].. 1 by mouth once daily  Patient Instructions: 1)  it was good to see you today. 2)  will start new medication for high bllod pressure - amlodipine 5mg  once daily - your prescriptions have been electronically submitted to your pharmacy. Please take as directed. Contact our office if you believe you're having problems with the medication(s).  3)  we'll make  referral to Dr. Everardo All for endocrinology. Our office will contact you regarding this appointment once made.  4)  Schedule appointment with Dr. Vassie Loll for sleep  followup and keep appointment with Dr. Leone Payor as planned 05/22/09. 5)  Please schedule a follow-up appointment in 4 weeks to recheck blood pressure, sooner if problems.  Prescriptions: AMLODIPINE BESYLATE 5 MG TABS (AMLODIPINE BESYLATE) 1 by mouth once daily  #30 x 5   Entered and Authorized by:   Newt Lukes MD   Signed by:   Newt Lukes MD on 05/18/2009   Method used:   Electronically to        [facility] [address] (retail)       [address]       [place], [place]  [address]       Ph: [phone] or [phone]       Fax: [phone]   RxID:   [id number]

## 2010-03-25 NOTE — Letter (Signed)
Summary: Cardiac Catheterization Instructions- [facility]  [facility], Main Office  [address]   [place], [place] [address]   Phone: [phone]  Fax: [phone]     06/05/2009 MRN: [id number]  [facility] [address] [place], [place]  [address]  Dear Ms. Tedesco,   You are scheduled for a Cardiac Catheterization on Tuesday April 26,2011 with Dr. Shirlee Latch.  Please arrive to the 1st floor of the Heart and Vascular Center at [facility] at 11 am  on the day of your procedure. Please do not arrive before 6:30 a.m. Call the Heart and Vascular Center at [phone] if you are unable to make your appointmnet. The Code to get into the parking garage under the building is 0001. Take the elevators to the 1st floor. You must have someone to drive you home. Someone must be with you for the first 24 hours after you arrive home. Please wear clothes that are easy to get on and off and wear slip-on shoes. Do not eat or drink after midnight except water with your medications that morning. Bring all your medications and current insurance cards with you.  _x__ DO NOT take these medications the morning of your procedure.  GLYBURIDE METFORMIN  __x_ Make sure you take your aspirin.   Come to our office at [facility] [facility] for lab Monday April 25,2011.     The usual length of stay after your procedure is 2 to 3 hours. This can vary.  If you have any questions, please call the office at the number listed above.   Katina Dung, RN, BSN

## 2010-03-25 NOTE — Progress Notes (Signed)
  Phone Note Outgoing Call Call back at [facility] Phone [phone]   Call placed by: Stanton Kidney, EMT-P,  June 05, 2009 9:42 AM Call placed to: Patient Action Taken: Phone Call Completed Summary of Call: Patient requested a call to confirm it was clear for her to go out of town following stress test. Per Dr. Shirlee Latch, Pt's myoview stress testis normal, Patient is clear, however; he will have his RN Thurston Hole call patient to discuss right heart cath. Patient aware. Stanton Kidney, EMT-P  June 05, 2009 9:44 AM

## 2010-03-25 NOTE — Assessment & Plan Note (Signed)
Summary: 1 MTH FU  STC   Vital Signs:  Patient profile:   65 year old female Weight:      223 pounds Temp:     98.3 degrees F oral Pulse rate:   80 / minute BP sitting:   110 / 50  (left arm) Cuff size:   large  Vitals Entered By: Lamar Sprinkles, CMA (August 10, 2009 9:32 AM) CC: F/u since starting insulin   Referring Provider:  Newt Lukes MD Primary Provider:  Newt Lukes MD  CC:  F/u since starting insulin.  History of Present Illness: she brings a record of her cbg's which i have reviewed today.  almost all are in the low-100's.  pt states she feels well in general.  Current Medications (verified): 1)  Aspirin 81 Mg Tabs (Aspirin) .... Once Daily 2)  Vitamin D 1000 Unit Tabs (Cholecalciferol) .... Take 1 Tablet By Mouth A Day 3)  Diovan 160 Mg Tabs (Valsartan) .[name].. 1 By Mouth Once Daily 4)  Requip 4 Mg Tabs (Ropinirole Hcl) .... Take 1 By Mouth Qd 5)  Calcium-Vitamin D [phone] Mg-Unit Tabs (Calcium-Vitamin D) .... Once Daily 6)  Vitamin E 1000 Unit Caps (Vitamin E) .... Once Daily 7)  Vitamin B-12 1000 Mcg Tabs (Cyanocobalamin) .... Once Daily 8)  Coq10 30 Mg Caps (Coenzyme Q10) .... Once Daily 9)  Ra Omega 3-6-9  Caps (Misc Natural Products) .... Once Daily 10)  Niaspan 1000 Mg Cr-Tabs (Niacin (Antihyperlipidemic)) .... One Tablet At Bedtime 11)  Coreg 12.5 Mg Tabs (Carvedilol) .... One Tablet Twice A Day 12)  Lovaza 1 Gm Caps (Omega-3-Acid Ethyl Esters) .... Two Twice Daily 13)  Fenofibrate 160 Mg Tabs (Fenofibrate) .... Take 1 By Mouth Qd 14)  Lasix 40 Mg Tabs (Furosemide) .... Take 1 Two Times A Day 15)  Humalog Kwikpen 100 Unit/ml Soln (Insulin Lispro (Human)) .... 5 Units Three Times A Day (Just Before Each Meal), and Pen Needles Three Times A Day 16)  Freestyle Lite Test  Strp (Glucose Blood) .... Use As Directed Three Times A Day 17)  Amitriptyline Hcl 10 Mg Tabs (Amitriptyline Hcl) .[name].. 1 By Mouth At Bedtime 18)  Glyburide 5 Mg Tabs (Glyburide) .[name]..  1 By Mouth Two Times A Day  Allergies (verified): 1)  ! Sulfa 2)  Morphine  Review of Systems       she has lost weight, due to her efforts  Physical Exam  General:  obese.  no distress. Psych:  Alert and cooperative; normal mood and affect; normal attention span and concentration.     Impression & Recommendations:  Problem # 1:  DIABETES MELLITUS, TYPE II (ICD-250.00) she needs some adjustment in her therapy.  Other Orders: Est. Patient Level III ([address])  Patient Instructions: 1)  stop glyburide 2)  if necessary, increase the humalog to 8 units three times a day (just before each meal), then to 10 and then 12 units three times a day.  your goal will be to restore the blood sugar to the low-100's.   3)  Please schedule a follow-up appointment in 1 month.

## 2010-03-25 NOTE — Letter (Signed)
Summary: New Patient letter  [facility]  [address] [place], [place] [address]   Phone: [phone]  Fax: [phone]       04/21/2009 MRN: [id number]  [facility] [address] [place], [place]  [address]  Dear Ms. Nila,  Welcome to the Gastroenterology Division at [facility].    You are scheduled to see Dr.  Leone Payor on 05-22-09 at 2pm on the 3rd floor at [facility], [address].  We ask that you try to arrive at our office 15 minutes prior to your appointment time to allow for check-in.  We would like you to complete the enclosed self-administered evaluation form prior to your visit and bring it with you on the day of your appointment.  We will review it with you.  Also, please bring a complete list of all your medications or, if you prefer, bring the medication bottles and we will list them.  Please bring your insurance card so that we may make a copy of it.  If your insurance requires a referral to see a specialist, please bring your referral form from your primary care physician.  Co-payments are due at the time of your visit and may be paid by cash, check or credit card.     Your office visit will consist of a consult with your physician (includes a physical exam), any laboratory testing he/she may order, scheduling of any necessary diagnostic testing (e.g. x-ray, ultrasound, CT-scan), and scheduling of a procedure (e.g. Endoscopy, Colonoscopy) if required.  Please allow enough time on your schedule to allow for any/all of these possibilities.    If you cannot keep your appointment, please call [phone] to cancel or reschedule prior to your appointment date.  This allows [place] the opportunity to schedule an appointment for another patient in need of care.  If you do not cancel or reschedule by 5 p.m. the business day prior to your appointment date, you will be charged a $50.00 late cancellation/no-show fee.    Thank you for choosing [facility] for your medical needs.  We appreciate the opportunity to care for you.  Please visit [place] at our website  to learn more about our practice.                     Sincerely,                                                             The Gastroenterology Division

## 2010-03-25 NOTE — Assessment & Plan Note (Signed)
Summary: dyspnea/apc   Visit Type:  Initial Consult Copy to:  pcp Primary Provider/Referring Provider:  Newt Lukes MD  CC:  Pt here for pulmonary consult. Pt c/o S.O.B with exertion x 1 year worse the last six months. Pt also states family members c/o her snoring and stop breathing when sleep.  History of Present Illness: 65/F never smoker, diabetic , hypertensive for evaluation of dyspnea & obstructive sleep apnea. She reports dyspnea on exertion x 1 year, denies chest pain,cough, palpitations , paroxysmal nocturnal dyspnea. She reprots pedal edema x few weeks & sleeps in a recliner ? orthopnea. Mother has noted loud snoring but no witnessed apneas or choking/ gasping episodes. She uses an albuterol inhaler as needed & reports wheezing x last 3 years , no childhood h/o asthma. She has restless lega x 10 years & requip makes her sleepy. Epworth Sleepiness Score is reported as 24 (but she states this is due to requip). bedtime is midnight, sleep latenyc is variable & sometimes she takes 5 mg ambien (1/2 tab), denies nocturia, wakes up fatigued, no morning headaches. There is no history suggestive of cataplexy, sleep paralysis or parasomnias  BNP 23, nml renal function, TSH nml CXR 2/28 wnl  Current Medications (verified): 1)  Fish Oil 1000 Mg Caps (Omega-3 Fatty Acids) .... Take 2 By Mouth Qd 2)  Aspirin 81 Mg Tabs (Aspirin) .... Once Daily 3)  Vitamin D 1000 Unit Tabs (Cholecalciferol) .... Take 1 Tablet By Mouth Four Times A Day 4)  Chromium 200 Mcg Tabs (Chromium) .... Once Daily 5)  Glyburide 5 Mg Tabs (Glyburide) .... Take 1 By Mouth Qd 6)  Losartan Potassium-Hctz 100-25 Mg Tabs (Losartan Potassium-Hctz) .... Take 1 By Mouth Qd 7)  Requip 4 Mg Tabs (Ropinirole Hcl) .... Take 1 By Mouth Qd 8)  Calcium-Vitamin D [phone] Mg-Unit Tabs (Calcium-Vitamin D) .... Once Daily 9)  Vitamin E 1000 Unit Caps (Vitamin E) .... Once Daily 10)  Vitamin B-12 1000 Mcg Tabs (Cyanocobalamin) ....  Once Daily 11)  Coq10 30 Mg Caps (Coenzyme Q10) .... Once Daily 12)  Ra Omega 3-6-9  Caps (Misc Natural Products) .... Once Daily 13)  Niacin Cr 500 Mg Cr-Caps (Niacin) .[name].. 1 By Mouth At Bedtime 14)  Metformin Hcl 500 Mg Tabs (Metformin Hcl) .... Take 1 Tablet By Mouth Two Times A Day 15)  Ambien .... Take 1/2 Tab By Mouth At Bedtime As Needed (Pt Unsure of Strength)  Allergies (verified): 1)  Morphine  Past History:  Past Medical History: Last updated: 04/20/2009 Diabetes mellitus, type II Hyperlipidemia Hypertension obesity RLS since age 80yo OA  peripheral neuropathy  Family History: Last updated: 04/20/2009 Family History of Alcoholism/Addiction (parent) Family History Diabetes 1st degree relative 9grandparent) Family History High cholesterol (parent, grandparent) Family History Hypertension (parent, grandparent) Family History Lung cancer (grandparent) Stomach cancer (grandmother)  Social History: Last updated: 04/20/2009 Never Smoked no alcohol married, lives with spouse and her mother 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  The patient denies productive cough, coughing up blood, chest pain, irregular heartbeats, indigestion, loss of appetite, weight change, sore throat, tooth/dental problems, headaches, ear ache,  anxiety, depression, rash, change in color of mucus, and fever.    Vital Signs:  Patient profile:   65 year old female Height:      63 inches Weight:      235.50 pounds O2 Sat:      94 % on Room  air Temp:     97.9 degrees F oral Pulse rate:   97 / minute BP sitting:   160 / 80  (right arm) Cuff size:   large  Vitals Entered By: Zackery Barefoot CMA (April 29, 2009 3:04 PM)  O2 Flow:  Room air CC: Pt here for pulmonary consult. Pt c/o S.O.B with exertion x 1 year worse the last six months. Pt also states family members c/o her snoring and stop breathing when sleep Comments Medications reviewed with patient Verified contact number and pharmacy with patient Zackery Barefoot CMA  April 29, 2009 3:04 PM    Physical Exam  Additional Exam:  Gen. Pleasant, well-nourished, in no distress, normal affect ENT - no lesions, no post nasal drip, class 2 airway Neck: No JVD, no thyromegaly, no carotid bruits Lungs: no use of accessory muscles, no dullness to percussion, clear without rales or rhonchi  Cardiovascular: Rhythm regular, heart sounds  normal, gr 2 ESMat base, no gallops, 1+ peripheral edema Abdomen: soft and non-tender, no hepatosplenomegaly, BS normal. Musculoskeletal: No deformities, no cyanosis or clubbing Neuro:  alert, non focal     Impression & Recommendations:  Problem # 1:  DYSPNEA (ICD-786.05) unclear etio.  Check echo for murmur, mitral valve BNP is low but Take lasix 20 mg once daily x 2 weeks , Call in 3 days if urine output is not high. Orders: Consultation Level IV ([address]) Prescription Created Electronically [phone]) Sleep Disorder Referral (Sleep Disorder) Echo Referral (Echo) TLB-BNP (B-Natriuretic Peptide) (83880-BNPR) TLB-CBC Platelet - w/Differential (85025-CBCD) TLB-TSH (Thyroid Stimulating Hormone) (84443-TSH) TLB-CMP (Comprehensive Metabolic Pnl) (80053-COMP)  Problem # 2:  OBSTRUCTIVE SLEEP APNEA (ICD-780.57) The pathophysiology of obstructive sleep apnea, it's cardiovascular consequences and modes of treatment including CPAP were discussed with the patient in great detail.  Proceed with split study  Medications Added to Medication List This  Visit: 1)  Vitamin D 1000 Unit Tabs (Cholecalciferol) .... Take 1 tablet by mouth four times a day 2)  Metformin Hcl 500 Mg Tabs (Metformin hcl) .... Take 1 tablet by mouth two times a day 3)  Ambien  .... Take 1/2 tab by mouth at bedtime as needed (pt unsure of strength)  Patient Instructions: 1)  Copy sent to: Dr Felicity Coyer 2)  Please schedule a follow-up appointment in 3 weeks. 3)  Blood work today 4)  Take lasix 20 mg once daily x 2 weeks , Call in 3 days if urine output is not high. 5)  Sleep study   Immunization History:  Influenza Immunization History:    Influenza:  historical (02/20/2009)

## 2010-03-25 NOTE — Progress Notes (Signed)
Summary: Open MRI?  Phone Note Call from Patient Call back at Home Phone [phone]   Caller: Patient Summary of Call: pt called stating that she was scheduled for MRI last wekk but was unable to have it done because she was claustrophobic. pt is requesting to be rescheduled to [facility] because they have an open MRI.  Initial call taken by: Margaret Pyle, CMA,  May 04, 2009 11:26 AM  Follow-up for Phone Call        ok to change to open MRI as available scheduling - thanks Follow-up by: Newt Lukes MD,  May 04, 2009 1:12 PM  Additional Follow-up for Phone Call Additional follow up Details #1::        Need an order so it can be sent to [facility] , there is no order in [facility]  Additional Follow-up by: Shelbie Proctor,  May 05, 2009 8:15 AM    Additional Follow-up for Phone Call Additional follow up Details #2::    order done - Newt Lukes MD  May 05, 2009 8:36 AM   Additional Follow-up for Phone Call Additional follow up Details #3:: Details for Additional Follow-up Action Taken: Appt scheduled for 3-22-201110:45am  pt was informed  of this appt Shelbie Proctor  May 07, 2009 8:08 AM

## 2010-03-25 NOTE — Assessment & Plan Note (Signed)
Summary: 6-8 WK FU  STC   Vital Signs:  Patient profile:   65 year old female Height:      61 inches (154.94 cm) Weight:      224.4 pounds (102 kg) O2 Sat:      92 % on Room air Temp:     97.2 degrees F (36.22 degrees C) oral Pulse rate:   71 / minute BP sitting:   118 / 62  (left arm) Cuff size:   large  Vitals Entered By: Orlan Leavens (August 03, 2009 10:54 AM)  O2 Flow:  Room air CC: 6  week follow-up Is Patient Diabetic? Yes Did you bring your meter with you today? No   Primary Care Provider:  Newt Lukes MD  CC:  6  week follow-up.  History of Present Illness: here for follow up -  1) DM2 - now on insulin only tx, no oral meds - has meet with nutritionist and working on weight loss - home cbgs reviewed - no hypoglycemia events or symptoms   2) HTN -reports compliance with ongoing medical treatment and no changes in medication dose or frequency. denies adverse side effects related to current therapy. no edema, no CP or HA  3) dyslipidemia - intol of statins with adv rxn in past but tol current meds well -  4) c/o poor sleep - exac by stress of mother living with her, intol of ambein in past - no daytime sleepiness symptoms   Current Medications (verified): 1)  Aspirin 81 Mg Tabs (Aspirin) .... Once Daily 2)  Vitamin D 1000 Unit Tabs (Cholecalciferol) .... Take 1 Tablet By Mouth A Day 3)  Glyburide 5 Mg Tabs (Glyburide) .... Take 1 By Mouth Two Times A Day 4)  Diovan 160 Mg Tabs (Valsartan) .[name].. 1 By Mouth Once Daily 5)  Requip 4 Mg Tabs (Ropinirole Hcl) .... Take 1 By Mouth Qd 6)  Calcium-Vitamin D [phone] Mg-Unit Tabs (Calcium-Vitamin D) .... Once Daily 7)  Vitamin E 1000 Unit Caps (Vitamin E) .... Once Daily 8)  Vitamin B-12 1000 Mcg Tabs (Cyanocobalamin) .... Once Daily 9)  Coq10 30 Mg Caps (Coenzyme Q10) .... Once Daily 10)  Ra Omega 3-6-9  Caps (Misc Natural Products) .... Once Daily 11)  Niaspan 1000 Mg Cr-Tabs (Niacin (Antihyperlipidemic)) .... One  Tablet At Bedtime 12)  Coreg 12.5 Mg Tabs (Carvedilol) .... One Tablet Twice A Day 13)  Lovaza 1 Gm Caps (Omega-3-Acid Ethyl Esters) .... Two Twice Daily 14)  Fenofibrate 160 Mg Tabs (Fenofibrate) .... Take 1 By Mouth Qd 15)  Lasix 40 Mg Tabs (Furosemide) .... Take 1 Two Times A Day 16)  Metformin Hcl 500 Mg Xr24h-Tab (Metformin Hcl) .[name].. 1 Tab Two Times A Day 17)  Humalog Kwikpen 100 Unit/ml Soln (Insulin Lispro (Human)) .... 5 Units Three Times A Day (Just Before Each Meal), and Pen Needles Three Times A Day 18)  Freestyle Lite Test  Strp (Glucose Blood) .... Use As Directed Three Times A Day  Allergies (verified): 1)  ! Sulfa 2)  Morphine  Past History:  Past Medical History: 1. Diabetes mellitus, type II 2. Hyperlipidemia: She has been unable to tolerate statins (has been on Vytorin, lovastatin, and Crestor) due to what sounds like rhabdomyolysis.  She developed muscle weakness and then was told she had "muscle damage" with each of these statins. She has been told not to take statins anymore.  3. Hypertension: On BP meds for 3-4 years.  Was hypertensive before this but  was told she had "white coat" HTN.   4. Obesity 5. Restless Leg Syndrome siince age 64yo 6. OA  7. peripheral neuropathy 8. OSA: Mild, does not use CPAP 9. Chronic nausea/diarrhea: ? IBS 10.  Diastolic CHF: Echo (3/11) with normal LV size, moderate LVH, EF 65-70%, LV mid-cavity gradient reaching 63 mmHg with valsalva  but minimal at rest, grade I diastolic dysfunction, cannot estimate PA systolic pressure (no TR doppler signal), RV normal.  RHC (4/11): Mean RA 11, RV 37/13, PA 33/15, mean PCWP 12, CI 3.3.  11. ETT-myoview (4/11): 5'30", stopped due to fatigue, normal EF, no evidence for scar or ischemia.   MD roster: Erline Hau Vassie Loll GI - Gessner endo Everardo All Nsurg - Elsner gyn - susane miller cards- Shirlee Latch  Review of Systems  The patient denies anorexia, weight gain, chest pain, syncope, and peripheral edema.     Physical Exam  General:  overweight-appearing.  alert, well-developed, well-nourished, and cooperative to examination.    Lungs:  normal respiratory effort, no intercostal retractions or use of accessory muscles; normal breath sounds bilaterally - no crackles and no wheezes.    Heart:  normal rate, regular rhythm, no murmur, and no rub. BLE without edema.    Impression & Recommendations:  Problem # 1:  DIABETES MELLITUS, TYPE II (ICD-250.00)  now on insuline only - doing well with this med and diet changes - reviewed, encouraged cont of same efforts The following medications were removed from the medication list:    Glyburide 5 Mg Tabs (Glyburide) .[name]... Take 1 by mouth two times a day    Metformin Hcl 500 Mg Xr24h-tab (Metformin hcl) .[name]... 1 tab two times a day Her updated medication list for this problem includes:    Aspirin 81 Mg Tabs (Aspirin) ..... Once daily    Diovan 160 Mg Tabs (Valsartan) .[name]... 1 by mouth once daily    Humalog Kwikpen 100 Unit/ml Soln (Insulin lispro (human)) .Marland KitchenMarland KitchenMarland KitchenMarland Kitchen 5 units three times a day (just before each meal), and pen needles three times a day  Labs Reviewed: Creat: 1.0 (06/22/2009)    Reviewed HgBA1c results: 7.8 (07/06/2009)  7.6 (02/24/2009)  Problem # 2:  HYPERLIPIDEMIA (ICD-272.4)  Her updated medication list for this problem includes:    Niaspan 1000 Mg Cr-tabs (Niacin (antihyperlipidemic)) ..... One tablet at bedtime    Lovaza 1 Gm Caps (Omega-3-acid ethyl esters) .[name]..[name] Two twice daily    Fenofibrate 160 Mg Tabs (Fenofibrate) .[name]... Take 1 by mouth qd  Labs Reviewed: SGOT: 25 (04/29/2009)   SGPT: 21 (04/29/2009)   HDL:58.30 (05/29/2009), 62 (08/15/2008)  LDL:127 (08/15/2008), 91 (04/54/0981)  Chol:308 (05/29/2009), 244 (08/15/2008)  Trig:539.0 (05/29/2009), 233 (08/15/2008)  Problem # 3:  HYPERTENSION (ICD-401.9)  Her updated medication list for this problem includes:    Diovan 160 Mg Tabs (Valsartan) .[name]... 1 by mouth once daily     Coreg 12.5 Mg Tabs (Carvedilol) ..... One tablet twice a day    Lasix 40 Mg Tabs (Furosemide) .[name]... Take 1 two times a day  BP today: 118/62 Prior BP: 128/70 (07/10/2009)  Labs Reviewed: K+: 3.9 (06/22/2009) Creat: : 1.0 (06/22/2009)   Chol: 308 (05/29/2009)   HDL: 58.30 (05/29/2009)   LDL: 127 (08/15/2008)   TG: 539.0 (05/29/2009)  Problem # 4:  INSOMNIA (ICD-780.52)  intol of ambien in past -- trial tx now with low dose amitryptline - new erx done also discussed sleep hygiene.   Orders: Prescription Created Electronically [phone])  Complete Medication List: 1)  Aspirin 81  Mg Tabs (Aspirin) .... Once daily 2)  Vitamin D 1000 Unit Tabs (Cholecalciferol) .... Take 1 tablet by mouth a day 3)  Diovan 160 Mg Tabs (Valsartan) .[name].. 1 by mouth once daily 4)  Requip 4 Mg Tabs (Ropinirole hcl) .... Take 1 by mouth qd 5)  Calcium-vitamin D [phone] Mg-unit Tabs (calcium-vitamin D)  .... Once daily 6)  Vitamin E 1000 Unit Caps (Vitamin e) .... Once daily 7)  Vitamin B-12 1000 Mcg Tabs (Cyanocobalamin) .... Once daily 8)  Coq10 30 Mg Caps (Coenzyme q10) .... Once daily 9)  Ra Omega 3-6-9 Caps (Misc natural products) .... Once daily 10)  Niaspan 1000 Mg Cr-tabs (Niacin (antihyperlipidemic)) .... One tablet at bedtime 11)  Coreg 12.5 Mg Tabs (Carvedilol) .... One tablet twice a day 12)  Lovaza 1 Gm Caps (Omega-3-acid ethyl esters) .... Two twice daily 13)  Fenofibrate 160 Mg Tabs (Fenofibrate) .... Take 1 by mouth qd 14)  Lasix 40 Mg Tabs (Furosemide) .... Take 1 two times a day 15)  Humalog Kwikpen 100 Unit/ml Soln (Insulin lispro (human)) .... 5 units three times a day (just before each meal), and pen needles three times a day 16)  Freestyle Lite Test Strp (Glucose blood) .... Use as directed three times a day 17)  Amitriptyline Hcl 10 Mg Tabs (Amitriptyline hcl) .[name].. 1 by mouth at bedtime  Patient Instructions: 1)  it was good to see you today.  2)  add amitriptyline for sleep - your  prescriptions have been electronically submitted to your pharmacy. Please take as directed. Contact our office if you believe you're having problems with the medication(s).  3)  no other medicine changes - keep doing what you are doing and keep up the good work! 4)  Please schedule a follow-up appointment in 3 months to review, sooner if problems.  Prescriptions: AMITRIPTYLINE HCL 10 MG TABS (AMITRIPTYLINE HCL) 1 by mouth at bedtime  #30 x 5   Entered and Authorized by:   Newt Lukes MD   Signed by:   Newt Lukes MD on 08/03/2009   Method used:   Electronically to        [facility] [address] (retail)       [address]       [place], [place]  [address]       Ph: [phone] or [phone]       Fax: [phone]   RxID:   [id number]

## 2010-03-25 NOTE — Letter (Signed)
Summary: Lincoln Maxin MD  Lincoln Maxin MD   Imported By: Sherian Rein 05/25/2009 08:25:10  _____________________________________________________________________  External Attachment:    Type:   Image     Comment:   External Document

## 2010-03-25 NOTE — Letter (Signed)
Summary: Generic Letter  [facility]  [address] [place], [place] [address]   Phone: [phone]  Fax: [phone]    12/07/2009  Melissa Myers [address] [place], [place]  [address]  Dear [name],    I have attempted to contact you, leaving several message with no return call. I called in response to the message you left requesting advisement on weaning yourself off of Requip. Dr Felicity Coyer advises the following:   would rec taking 1/2 dose (2mg ) at bedtime x 2 weeks, then 1mg  at bedtime x 2 weeks then stop  If you have any further questions please feel free to contact our office at the above phone number, Thank you.        Sincerely,   Margaret Pyle, CMA

## 2010-03-25 NOTE — Progress Notes (Signed)
Summary: CX Myoview Study  Phone Note Outgoing Call   Call placed by: Milana Na, EMT-P,  June 01, 2009 3:46 PM Summary of Call: Reviewed information on Myoview Information Sheet (see scanned document for further details).  Spoke with patient. Cancelled due to insurance issues.

## 2010-03-25 NOTE — Progress Notes (Signed)
Summary: citalopram & metformin  Phone Note Refill Request Message from:  Fax from Pharmacy on February 23, 2010 11:03 AM  Refills Requested: Medication #1:  CITALOPRAM HYDROBROMIDE 20 MG TABS take 1 by mouth once daily # 30   Last Refilled: 01/18/2010  Medication #2:  METFORMIN HCL 500 MG TABS take 1 two times a day # 60   Last Refilled: 01/18/2010 Karin Golden @ [facility]    Method Requested: Electronic Initial call taken by: Orlan Leavens RMA,  February 23, 2010 11:05 AM    Prescriptions: CITALOPRAM HYDROBROMIDE 20 MG TABS (CITALOPRAM HYDROBROMIDE) take 1 by mouth once daily  #30 x 5   Entered by:   Orlan Leavens RMA   Authorized by:   Newt Lukes MD   Signed by:   Orlan Leavens RMA on 02/23/2010   Method used:   Electronically to        [facility] [address] (retail)       [address]       [place], [place]  [address]       Ph: [phone] or [phone]       Fax: [phone]   RxID:   [id number] METFORMIN HCL 500 MG TABS (METFORMIN HCL) take 1 two times a day  #60 x 5   Entered by:   Orlan Leavens RMA   Authorized by:   Newt Lukes MD   Signed by:   Orlan Leavens RMA on 02/23/2010   Method used:   Electronically to        [facility] [address] (retail)       [address]       [place], [place]  [address]       Ph: [phone] or [phone]       Fax: [phone]   RxID:   [id number]

## 2010-03-25 NOTE — Letter (Signed)
Summary: [facility]  [facility]   Imported By: Sherian Rein 08/19/2009 14:07:16  _____________________________________________________________________  External Attachment:    Type:   Image     Comment:   External Document

## 2010-03-25 NOTE — Progress Notes (Signed)
Summary: Records received from [facility]  113 pages of records received from [facility]. [name] made aware. Records sent up on dumbwaiter.[name]  April 30, 2009 1:04 PM

## 2010-03-25 NOTE — Assessment & Plan Note (Signed)
Summary: Cardiology Nuclear Study  Nuclear Med Background Indications for Stress Test: Evaluation for Ischemia   History: Echo  History Comments: 05/05/09 Echo:moderate LVH, EF=65-70%; Diastolic CHF; h/o OSA  Symptoms: DOE, Fatigue, Palpitations, SOB  Symptoms Comments: Bilateral Ankle Edema   Nuclear Pre-Procedure Cardiac Risk Factors: Family History - CAD, Hypertension, Lipids, NIDDM, Obesity, RBBB Caffeine/Decaff Intake: none NPO After: 8:30 AM Lungs: Clear IV 0.9% NS with Angio Cath: 20g     IV Site: (R) AC IV Started by: Stanton Kidney EMT-P Chest Size (in) 42     Cup Size DD     Height (in): 61 Weight (lb): 226 BMI: 42.86 Tech Comments: Coreg held > 24 hours, per patient. CBG= 143 @ 11:30, per patient.  Nuclear Med Study 1 or 2 day study:  1 day     Stress Test Type:  Stress Reading MD:  Olga Millers, MD     Referring MD:  Marca Ancona, MD Resting Radionuclide:  Technetium 22m Tetrofosmin     Resting Radionuclide Dose:  10.7 mCi  Stress Radionuclide:  Technetium 77m Tetrofosmin     Stress Radionuclide Dose:  33 mCi   Stress Protocol Exercise Time (min):  5:30 min     Max HR:  137 bpm     Predicted Max HR:  157 bpm  Max Systolic BP: 163 mm Hg     Percent Max HR:  87.26 %     METS: 7.0 Rate Pressure Product:  14782    Stress Test Technologist:  Rea College CMA-N     Nuclear Technologist:  Domenic Polite CNMT  Rest Procedure  Myocardial perfusion imaging was performed at rest 45 minutes following the intravenous administration of Myoview Technetium 54m Tetrofosmin.  Stress Procedure  The patient exercised for 5:30.  The patient stopped due to fatigue and denied any chest pain.  There were no significant ST-T wave changes.  Myoview was injected at peak exercise and myocardial perfusion imaging was performed after a brief delay.  QPS Raw Data Images:  There is a breast shadow. Stress Images:  There is normal uptake in all areas. Rest Images:  Normal homogeneous  uptake in all areas of the myocardium. Subtraction (SDS):  No evidence of ischemia. Transient Ischemic Dilatation:  .90  (Normal <1.22)  Lung/Heart Ratio:  .30  (Normal <0.45)  Quantitative Gated Spect Images QGS EDV:  51 ml QGS ESV:  7 ml QGS EF:  86 % QGS cine images:  Normal wall motion.   Overall Impression  Exercise Capacity: Fair exercise capacity. BP Response: Normal blood pressure response. Clinical Symptoms: There is dyspnea. ECG Impression: No significant ST segment change suggestive of ischemia. Overall Impression: There is no sign of scar or ischemia.  Appended Document: Cardiology Nuclear Study myoview without evidence for ischemia.  Would like to set her up for a right heart cath to assess filling pressures and figure out why she is short of breath.   Appended Document: Cardiology Nuclear Study discussed with patient by telephone--will schedule right heart cath 06-16-09 [facility] lab,pt to come for lab 06-15-09---she will be out of town until 06-14-09   Clinical Lists Changes  Orders: Added new Referral order of Cardiac Catheterization (Cardiac Cath) - Signed

## 2010-03-25 NOTE — Op Note (Signed)
Summary: Umbilical Hernia/[facility]   Imported By: Sherian Rein 05/25/2009 08:27:15  _____________________________________________________________________  External Attachment:    Type:   Image     Comment:   External Document

## 2010-03-25 NOTE — Progress Notes (Signed)
Summary: Oral DM meds?  Phone Note Call from Patient Call back at Home Phone [phone]   Caller: Patient Summary of Call: pt called stating that she took her last Glyburide "Sunday per SAE instruction and her CBG have elevated 200+ since. Per pt, her CBGs were in the low 100s when she was using both oral and injected DM medication and she would like to re-start oral medication. Please advise. Initial call taken by: Dahlia Bonyun-DeBourgh, CMA,  August 04, 2009 2:16 PM  Follow-up for Phone Call        ok to take both glyburide 5 mg two times a day and insulin (as per dr. ellison's instruction on insulin) - new erx for glyburide done Follow-up by:  A  MD,  August 04, 2009 4:21 PM    New/Updated Medications: GLYBURIDE 5 MG TABS (GLYBURIDE) 1 by mouth two times a day Prescriptions: GLYBURIDE 5 MG TABS (GLYBURIDE) 1 by mouth two times a day  #60 x 5   Entered and Authorized by:    A  MD   Signed by:    A  MD on 08/04/2009   Method used:   Electronically to        [facility] [address] (retail)       [address]       [place], [place]  [address]       Ph: [phone] or [phone]       Fax: [phone]   RxID:   [id number]

## 2010-03-25 NOTE — Assessment & Plan Note (Signed)
Summary: rov ///kp   Visit Type:  Follow-up Copy to:  pcp Primary Provider/Referring Provider:  Newt Lukes MD  CC:  Pt here for follow up and to receive test results. Pt c/o S.O.B all the time and lower extremity edema and total body fluid retention.  History of Present Illness: 63/F never smoker, diabetic , hypertensive for FU of dyspnea. She reports dyspnea on exertion x 1 year, denies chest pain,cough, palpitations , paroxysmal nocturnal dyspnea. She reprots pedal edema x few weeks & sleeps in a recliner ? orthopnea. Mother has noted loud snoring but no witnessed apneas or choking/ gasping episodes. She uses an albuterol inhaler as needed & reports wheezing x last 3 years , no childhood h/o asthma. She has restless legs x 10 years & requip makes her sleepy. Epworth Sleepiness Score is reported as 24 (but she states this is due to requip). bedtime is midnight, sleep latenyc is variable & sometimes she takes 5 mg ambien (1/2 tab), denies nocturia, wakes up fatigued, no morning headaches.  BNP 23, nml renal function, TSH nml CXR 2/28 wnl  May 19, 2009 11:12 AM  Reviewed echo >> grade I diastolic dysfunction, mild MR, mitral valve calcification BNP 23 Diuresed mildly with 20 mg lasix., Started on norvasc for high BP. Reviewed PSG (on requip & ambien)>> mild obstructive sleep apnea , predominnat hypopneas in supine position, lowest destauration 80%, < 1 min with satn < 88%. TST 350 mins, 39 mins of REM. No PLMs  Current Medications (verified): 1)  Fish Oil 1000 Mg Caps (Omega-3 Fatty Acids) .... Take 2 By Mouth Qd 2)  Aspirin 81 Mg Tabs (Aspirin) .... Once Daily 3)  Vitamin D 1000 Unit Tabs (Cholecalciferol) .... Take 1 Tablet By Mouth Four Times A Day 4)  Chromium 200 Mcg Tabs (Chromium) .... Once Daily 5)  Glyburide 5 Mg Tabs (Glyburide) .... Take 1 By Mouth Qd 6)  Losartan Potassium-Hctz 100-25 Mg Tabs (Losartan Potassium-Hctz) .... Take 1 By Mouth Qd 7)  Requip 4 Mg Tabs  (Ropinirole Hcl) .... Take 1 By Mouth Qd 8)  Calcium-Vitamin D [phone] Mg-Unit Tabs (Calcium-Vitamin D) .... Once Daily 9)  Vitamin E 1000 Unit Caps (Vitamin E) .... Once Daily 10)  Vitamin B-12 1000 Mcg Tabs (Cyanocobalamin) .... Once Daily 11)  Coq10 30 Mg Caps (Coenzyme Q10) .... Once Daily 12)  Ra Omega 3-6-9  Caps (Misc Natural Products) .... Once Daily 13)  Niacin Cr 500 Mg Cr-Caps (Niacin) .[name].. 1 By Mouth At Bedtime 14)  Metformin Hcl 500 Mg Tabs (Metformin Hcl) .... Take 1 Tablet By Mouth Two Times A Day 15)  Ambien .... Take 1/2 Tab By Mouth At Bedtime As Needed (Pt Unsure of Strength) 16)  Furosemide 20 Mg Tabs (Furosemide) .... Take 1 Tablet By Mouth Once A Day X 2 Weeks 17)  Amlodipine Besylate 5 Mg Tabs (Amlodipine Besylate) .[name].. 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  The patient denies anorexia, fever, weight loss, weight gain, vision  loss, decreased hearing, hoarseness, chest pain, syncope, prolonged cough, headaches, hemoptysis, abdominal pain, melena, hematochezia, severe indigestion/heartburn, hematuria, muscle weakness, difficulty walking, depression, unusual weight change, and abnormal bleeding.    Vital Signs:  Patient profile:   65 year old female Height:      63 inches Weight:      235.25 pounds O2 Sat:      96 % on Room air Temp:     97.9 degrees F oral Pulse rate:   90 / minute BP sitting:   152 / 88  (left arm) Cuff size:   large  Vitals Entered  By: Zackery Barefoot CMA (May 19, 2009 10:49 AM)  O2 Flow:  Room air CC: Pt here for follow up and to receive test results. Pt c/o S.O.B all the time, lower extremity edema and total body fluid retention Comments Medications reviewed with patient Verified contact number and pharmacy with patient  Zackery Barefoot CMA  May 19, 2009 10:49 AM    Physical Exam  Additional Exam:  Gen. Pleasant, well-nourished, in no distress, normal affect ENT - no lesions, no post nasal drip, class 2 airway Neck: No JVD, no thyromegaly, no carotid bruits Lungs: no use of accessory muscles, no dullness to percussion, clear without rales or rhonchi  Cardiovascular: Rhythm regular, heart sounds  normal, gr 2 ESMat base, no gallops, 1+ peripheral edema Musculoskeletal: No deformities, no cyanosis or clubbing      Impression & Recommendations:  Problem # 1:  DIASTOLIC DYSFUNCTION (ICD-429.9) Increase lasix to 40 mg once daily with K supplement Cards referral. BP control Orders: Cardiology Referral (Cardiology) Est. Patient Level IV ([address]) Prescription Created Electronically [phone])  Problem # 2:  OBSTRUCTIVE SLEEP APNEA (ICD-780.57)  Mild predominant hypopneas in supine sleep. Positional therapy, no need for CPAP. The pathophysiology of obstructive sleep apnea, it's cardiovascular consequences and modes of treatment including CPAP were discussed with the patient in great detail.   Orders: Est. Patient Level IV ([address]) Prescription Created Electronically [id number])  Problem # 3:  RESTLESS LEGS SYNDROME (ICD-333.94)  Consider decreasing dose of requip on future if excessive daytime somnolence remains an issue.  Orders: Est. Patient Level IV ([address]) Prescription Created Electronically [id number])  Medications Added to Medication List This Visit: 1)  Furosemide 40 Mg Tabs (Furosemide) .... Once daily 2)  Potassium Chloride 20 Meq Pack (Potassium chloride) .... Every other day  Patient  Instructions: 1)  Copy sent to: Dr Felicity Coyer 2)  Please schedule a follow-up appointment in 1 month. 3)  It is recommended that you schedule a consultation with a Cardiologist: Dr Marca Ancona 4)  Take 40 mg lasix once daily  5)  Take 20 meq potassium pill every other day  6)  Sleep on your side 7)  Weight loss is advised Prescriptions: POTASSIUM CHLORIDE 20 MEQ PACK (POTASSIUM CHLORIDE) every other day  #15 x 1   Entered and Authorized by:   Comer Locket Vassie Loll MD   Signed by:   Comer Locket Vassie Loll MD on 05/19/2009   Method used:   Electronically to        [facility] [address] (retail)       [address]       [place], [place]  [address]       Ph: [phone] or [phone]       Fax: [phone]   RxID:   [id number] FUROSEMIDE 40 MG TABS (FUROSEMIDE) once daily  #15  x 1   Entered and Authorized by:   Comer Locket Vassie Loll MD   Signed by:   Comer Locket Vassie Loll MD on 05/19/2009   Method used:   Electronically to        [facility] [address] (retail)       [address]       [place], [place]  [address]       Ph: [phone] or [phone]       Fax: [phone]   RxID:   [id number]

## 2010-03-25 NOTE — Progress Notes (Signed)
Summary: Requip   Phone Note Call from Patient Call back at Home Phone [phone]   Caller: Patient Summary of Call: Ptcalled stating that she has been taking Requip x 5 years but had been having side effects lately and decided to wean herself off. Pt is requesting MD advisement on the most safe way of doing this.  Initial call taken by: Margaret Pyle, CMA,  December 03, 2009 11:28 AM  Follow-up for Phone Call        would rec taking 1/2 dose (2mg ) at bedtime x 2 weeks, then 1mg  at bedtime x 2 weeks then stop - can send e-rx for 1mg  tabs to accomplish this wean or pt may split her 4mg  tabs as needed - thanks Follow-up by: Newt Lukes MD,  December 03, 2009 1:43 PM  Additional Follow-up for Phone Call Additional follow up Details #1::        left message on machine for pt to return my call. Margaret Pyle, CMA  December 03, 2009 2:10 PM  left message on machine for pt to return my call. Margaret Pyle, CMA  December 04, 2009 8:53 AM  left message on machine for pt to return my call. Closing phone note until further contact from pt Additional Follow-up by: Margaret Pyle, CMA,  December 07, 2009 3:37 PM    New/Updated Medications: REQUIP 4 MG TABS (ROPINIROLE HCL) take 1 by mouth once daily (or as directed)

## 2010-03-25 NOTE — Assessment & Plan Note (Signed)
Summary: CHRONIC NAUSEA/YF   History of Present Illness Visit Type: Initial Consult Primary Provider: Newt Lukes MD Chief Complaint: chronic nausea and 12 BMs per day  History of Present Illness:   65 yo white woman with many tears of multiple loose stools. This started priotr to cholecystectomy. She has had 2 colonoscopies, last in 2003, with normal random colon biopsies. She has not been seen here since.She "has to know where all the bathrooms are" whenever she goes anywhere. Uses loperamide successfully when travelling but gasses her up. There are very rare nocturnal stools. She has insomnia so if she does defecate at night it usually does not awaken her. There is also 1 year of intermittent nausea, unpredicatable and lasts for days at a time, in AM, Once she vomits, symptoms are gone. Chronic intermittent and sharp RLQ pain, also.  She was constipated as a child. Believes that loose stools after childbirth. Celiac tests negative in past at [facility] she says. Has a rash on face and daughter has "wheat allergy"  ?celiac and required addl testinng beyond standard tests to diagnowse. They are ?ig if Mrs. Pinnix has celiac disease.   GI Review of Systems    Reports acid reflux, bloating, nausea, and  vomiting.      Denies abdominal pain, belching, chest pain, dysphagia with liquids, dysphagia with solids, heartburn, loss of appetite, vomiting blood, weight loss, and  weight gain.      Reports diarrhea and  hemorrhoids.     Denies anal fissure, black tarry stools, change in bowel habit, constipation, diverticulosis, fecal incontinence, heme positive stool, irritable bowel syndrome, jaundice, light color stool, liver problems, rectal bleeding, and  rectal pain. Preventive Screening-Counseling & Management      Drug Use:  no.      Current Medications (verified): 1)  Fish Oil 1000 Mg Caps (Omega-3 Fatty Acids) .... Take 2 By Mouth Qd 2)  Aspirin 81 Mg Tabs (Aspirin) .... Once Daily 3)   Vitamin D 1000 Unit Tabs (Cholecalciferol) .... Take 1 Tablet By Mouth Four Times A Day 4)  Chromium 200 Mcg Tabs (Chromium) .... Once Daily 5)  Glyburide 5 Mg Tabs (Glyburide) .... Take 1 By Mouth Qd 6)  Losartan Potassium-Hctz 100-25 Mg Tabs (Losartan Potassium-Hctz) .... Take 1 By Mouth Qd 7)  Requip 4 Mg Tabs (Ropinirole Hcl) .... Take 1 By Mouth Qd 8)  Calcium-Vitamin D [phone] Mg-Unit Tabs (Calcium-Vitamin D) .... Once Daily 9)  Vitamin E 1000 Unit Caps (Vitamin E) .... Once Daily 10)  Vitamin B-12 1000 Mcg Tabs (Cyanocobalamin) .... Once Daily 11)  Coq10 30 Mg Caps (Coenzyme Q10) .... Once Daily 12)  Ra Omega 3-6-9  Caps (Misc Natural Products) .... Once Daily 13)  Niacin Cr 500 Mg Cr-Caps (Niacin) .[name].. 1 By Mouth At Bedtime 14)  Metformin Hcl 500 Mg Tabs (Metformin Hcl) .... Take 1 Tablet By Mouth Two Times A Day 15)  Amlodipine Besylate 5 Mg Tabs (Amlodipine Besylate) .[name].. 1 By Mouth Once Daily 16)  Furosemide 40 Mg Tabs (Furosemide) .... Once Daily 17)  Potassium Chloride 20 Meq Pack (Potassium Chloride) .... Every Other Day  Allergies (verified): 1)  Morphine  Past History:  Past Medical History: Diabetes mellitus, type II Hyperlipidemia Hypertension obesity Restless Leg Syndrome siince age 62yo OA  peripheral neuropathy OSA  MD rooster - pulm -[facility] - [facility] - [facility] - [facility]  Past Surgical History: Reviewed history from 04/29/2009 and no changes required. Cholecystectomy (1997) Tonsillectomy (1970) Tubal  ligation (1980) Uterine Polyp removal (2008) Umbilical hernia repair (1995)  Colonoscopy  Procedure date:  12/18/2001  Findings:      External hemorrhoids Normal random biopsies  Comments:      Repeat colonoscopy in 10 years.    Procedures Next Due Date:    Colonoscopy: 12/2011   Family History: Reviewed history from 04/20/2009 and no changes required. Family History of Alcoholism/Addiction (parent) Family History Diabetes  1st degree relative 9grandparent) Family History High cholesterol (parent, grandparent) Family History Hypertension (parent, grandparent) Family History Lung cancer (grandparent) Stomach cancer (grandmother) Celiac Sprue  daughter  Social History: Reviewed history from 04/20/2009 and no changes required. 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  The patient denies allergy/sinus, anemia, anxiety-new, blood in urine, breast changes/lumps, confusion, cough, coughing up blood, depression-new, fainting, fever, headaches-new, hearing problems, heart murmur, heart rhythm changes, menstrual pain, night sweats, nosebleeds, pregnancy symptoms, skin rash, sore throat, swollen lymph glands, thirst - excessive , urination - excessive , urination changes/pain, and urine leakage.    Vital Signs:  Patient profile:   65 year old female Height:      63 inches Weight:      231 pounds BMI:     41.07 Pulse rate:   88 / minute Pulse rhythm:   regular BP sitting:   156 / 58  (left arm)  Vitals Entered By: Milford Cage NCMA (May 22, 2009 2:08 PM)  Physical Exam  General:  obese.  NAD Eyes:  PERRLA, no icterus. Mouth:  No deformity or lesions, dentition normal. Neck:  Supple; no masses or thyromegaly.midline cyst.   Lungs:  Clear throughout to auscultation. Heart:  harsh 3/6 panysystolic murmur no gallops Abdomen:  obese, soft, non-tender, normal bowel sounds, no distention; no masses and no appreciable hepatomegaly or splenomegaly.   Extremities:  1+ edema bilat le Neurologic:  Alert and  oriented x4;   Skin:  papules, crusted, red on face not  on elbows or knees or extremities Cervical Nodes:  No significant cervical or supraclavicular adenopathy.  Psych:  Alert and cooperative. Normal mood and affect.   Impression & Recommendations:  Problem # 1:  DIARRHEA (ICD-787.91) Assessment New Chronic x many years. IBS has seemed likely. I have not seen her since 2003. She has also had iron-deficiency in past so celiac is possible. Will retest with celiac panel first. She could need HLA testing, possibly. would not repeat colonscopy at this point. Orders: T-Sprue Panel (Celiac Disease Aby Eval) (83516x3/[id number]) TLB-IgA (Immunoglobulin A) (82784-IGA)  Orders: T-Sprue Panel (Celiac Disease Aby Eval) (83516x3/[id number]) T-Tissue Transglutamase Ab IgA ([id number]) TLB-IgA (Immunoglobulin A) (82784-IGA)  Problem # 2:  NAUSEA AND VOMITING (ICD-787.01) Assessment: New ? nocturnal GERD observe as episodic and better discussed GERD measures like not eating late (she does not usually) and possibly raising head of bed  Patient Instructions: 1)  You will go to the basement for labs today 2)  GERD handout given 3)  The medication list was reviewed and reconciled.  All changed / newly prescribed medications were explained.  A complete medication list was provided to the patient / caregiver.

## 2010-03-25 NOTE — Progress Notes (Signed)
Summary: requip  Phone Note Refill Request Message from:  Fax from Pharmacy on May 12, 2009 10:11 AM  Refills Requested: Medication #1:  REQUIP 4 MG TABS take 1 by mouth qd   Last Refilled: 04/15/2009  Method Requested: Electronic Initial call taken by: Orlan Leavens,  May 12, 2009 10:12 AM    Prescriptions: REQUIP 4 MG TABS (ROPINIROLE HCL) take 1 by mouth qd  #90 x 2   Entered by:   Orlan Leavens   Authorized by:   Newt Lukes MD   Signed by:   Orlan Leavens on 05/12/2009   Method used:   Electronically to        [facility] [address] (retail)       [address]       [place], [place]  [address]       Ph: [phone] or [phone]       Fax: [phone]   RxID:   [id number]

## 2010-03-25 NOTE — Cardiovascular Report (Signed)
Summary: Pre Cath Orders  Pre Cath Orders   Imported By: Roderic Ovens 06/17/2009 13:03:37  _____________________________________________________________________  External Attachment:    Type:   Image     Comment:   External Document

## 2010-03-25 NOTE — Progress Notes (Signed)
Summary: needs prescription  Phone Note Call from Patient Call back at Home Phone [phone]   Caller: Patient Call For: alva Summary of Call: she was suppose to have a prescription for lasix called into her pharmacy. it was never called in.  [facility] on [address]  Initial call taken by: Valinda Hoar,  May 04, 2009 12:44 PM  Follow-up for Phone Call        per last ov n ote pt is to take 20mg  lasix x 2 weeks and call in 3 days if urine output is not high. I sent in rx and advised pt of recs. Carron Curie CMA  May 04, 2009 2:07 PM     New/Updated Medications: FUROSEMIDE 20 MG TABS (FUROSEMIDE) Take 1 tablet by mouth once a day x 2 weeks Prescriptions: FUROSEMIDE 20 MG TABS (FUROSEMIDE) Take 1 tablet by mouth once a day x 2 weeks  #30 x 0   Entered by:   Carron Curie CMA   Authorized by:   Comer Locket. Vassie Loll MD   Signed by:   Carron Curie CMA on 05/04/2009   Method used:   Electronically to        [facility] [address] (retail)       [address]       [place], [place]  [address]       Ph: [phone] or [phone]       Fax: [phone]   RxID:   [id number]

## 2010-03-25 NOTE — Progress Notes (Signed)
----   Converted from flag ---- ---- 05/29/2009 1:24 PM, Corwin Levins MD wrote: pt does not need labs until [facility] apr 11 as per my note  ---- 05/29/2009 9:01 AM, Zella Ball Ewing wrote: called and scheduled pt for labs today 05/29/2009.  ---- 05/28/2009 5:35 PM, Corwin Levins MD wrote: Zella Ball:  1)pt needs future labs:   bmet 429.9,  BNP  429.9, lipids 272.0, and TSH  780.79  for Mon AM apr 11  2)   needs ROV with Dr Felicity Coyer  6 wks ------------------------------

## 2010-03-25 NOTE — Procedures (Signed)
Summary: Colonoscopy: Hemorrhoids   Colonoscopy  Procedure date:  12/18/2001  Findings:      Results: Hemorrhoids.     Location:  [facility].    Procedures Next Due Date:    Colonoscopy: 12/2011  Patient Name: Melissa Myers, Melissa Myers MRN:  Procedure Procedures: Colonoscopy CPT: 04540.    with biopsy. CPT: Q5068410.  Personnel: Endoscopist: Iva Boop, MD, [facility].  Exam Location: Exam performed in Outpatient Clinic. Outpatient  Patient Consent: Procedure, Alternatives, Risks and Benefits discussed, consent obtained, from patient. Consent was obtained by the RN.  Indications Symptoms: Chronic loose bowel movements.  Average Risk Screening Routine.  History  Pre-Exam Physical: Performed Dec 18, 2001. Cardio-pulmonary exam, Rectal exam, HEENT exam , Mental status exam WNL.  Exam Exam: Extent of exam reached: Cecum, extent intended: Cecum.  The cecum was identified by appendiceal orifice and IC valve. Patient position: on left side. Colon retroflexion performed. Images taken. ASA Classification: I. Tolerance: excellent.  Monitoring: Pulse and BP monitoring, Oximetry used. Supplemental O2 given.  Colon Prep Used Golytely for colon prep. Prep results: excellent.  Sedation Meds: Patient assessed and found to be appropriate for moderate (conscious) sedation. Fentanyl 50 mcg. given IV. Versed 5 mg. given IV.  Findings - NORMAL EXAM: Cecum to Sigmoid Colon. Biopsy/Normal Exam taken. Comments: Biopsies to look for possible microscopic colitis.  HEMORRHOIDS: External. Size: Grade II. Not bleeding. Not thrombosed. ICD9: Hemorrhoids, External: 455.3.   Assessment Abnormal examination, see findings above.  Diagnoses: 455.3: Hemorrhoids, External.   Events  Unplanned Interventions: No intervention was required.  Plans Medication Plan: Await pathology.  Disposition: After procedure patient sent to recovery. After recovery patient sent home.   Comments: For screening, recommend home hemoccults every year starting in 5 years and screening colonoscopy in 10 years.   CC:   Jarome Matin, MD  This report was created from the original endoscopy report, which was reviewed and signed by the above listed endoscopist.

## 2010-03-31 NOTE — Assessment & Plan Note (Signed)
Summary: 4 MONTH ROV/SL/PER PT CALL-MJ RS PER PT CALL-MB APPT CONFIRM .Marland KitchenMarland Kitchen   Vital Signs:  Patient profile:   65 year old female Height:      61 inches Weight:      229 pounds BMI:     43.43 O2 Sat:      96 % Pulse rate:   84 / minute Pulse rhythm:   regular BP sitting:   126 / 64  (left arm) Cuff size:   large  Vitals Entered By: Judithe Modest CMA (March 24, 2010 4:38 PM)  Referring Provider:  Newt Lukes MD Primary Provider:  Newt Lukes MD   History of Present Illness: 65 yo with history of HTN, DM, hyperlipidemia, and chronic dyspnea returns for cardiac evaluation.  Patient had an echo showing moderate LVH and preserved LV systolic function.  However, there was a very large LV mid-cavity gradient with valsalva. Over a year ago, patient developed quite significant exertional dyspnea to the point where she was short of breath walking around her house.  She has had a pulmonary evaluation with Dr. Vassie Loll but no primary lung problems have been identified.  Patient had an ETT-myoview which was negative for ischemia or infarction.  RIght heart cath showed mildly elevated right heart filling pressures but normal PA pressure and normal PCWP.  I started her on a beta blocker (Coreg) to try to lower her LV mid-cavity gradient (that likely occurs with exertion) and to better control BP.  She is also on Lasix.    Weight is down 3 lbs since last appointment.  She seems to be doing fairly well.  She is not getting much exercise but is breathing better and can walk on flat ground without significant dyspnea.  She continues to be short of breath with stairs.  Her mother is now living with her, and she is not able to leave the house to exercise for fear that she will fall.  No chest pain.  No syncope.  She has some ankle swelling that is worse at the end of the day.   Labs (4/11): TGs 539, HDL 58, LDL 128, K 5.2, creatinine 1.3, TSH normal, BNP 14 Labs (5/11): K 3.9, creatinine 1.0 Labs  (9/11): creatinine 1.1, BUN 43, LDL 145, HDL 60 Labs (10/11): K 4.3, creatinine 0.81, HCT 34, LDL 115, HDL 75  Current Medications (verified): 1)  Aspirin 81 Mg Tabs (Aspirin) .... Once Daily 2)  Diovan 160 Mg Tabs (Valsartan) .[name].. 1 By Mouth Once Daily 3)  Requip 4 Mg Tabs (Ropinirole Hcl) .... Take 1 By Mouth Once Daily (Or As Directed) 4)  Coq10 30 Mg Caps (Coenzyme Q10) .... Once Daily 5)  Ra Omega 3-6-9  Caps (Misc Natural Products) .... Once Daily 6)  Niaspan 1000 Mg Cr-Tabs (Niacin (Antihyperlipidemic)) .... Two  At Bedtime 7)  Coreg 25 Mg Tabs (Carvedilol) .... One Twice A Day 8)  Lovaza 1 Gm Caps (Omega-3-Acid Ethyl Esters) .... Two Twice Daily 9)  Fenofibrate 160 Mg Tabs (Fenofibrate) .... Take 1 By Mouth Once Daily 10)  Lasix 40 Mg Tabs (Furosemide) .... Take Two Tablets in The Morning and One Tablet in The Evening 11)  Humalog Kwikpen 100 Unit/ml Soln (Insulin Lispro (Human)) .... 6 Units Three Times A Day (Just Before Each Meal), and Pen Needles Three Times A Day 12)  Freestyle Lite Test  Strp (Glucose Blood) .... Use As Directed Three Times A Day 13)  Metformin Hcl 500 Mg Tabs (Metformin Hcl) .Marland KitchenMarland KitchenMarland Kitchen  Take 1 Two Times A Day 14)  Citalopram Hydrobromide 20 Mg Tabs (Citalopram Hydrobromide) .... Take 1 By Mouth Once Daily 15)  Bactroban 2 % Oint (Mupirocin) .... Apply To Sore Two Times A Day X 7 Days, Then As Needed  Allergies (verified): 1)  ! Sulfa 2)  Morphine  Past History:  Past Medical History: Reviewed history from 02/01/2010 and no changes required. 1. Diabetes mellitus, type II 2. Hyperlipidemia: She has been unable to tolerate statins (has been on Vytorin, lovastatin, and Crestor) due to what sounds like rhabdo: developed muscle weakness and was told she had "muscle damage" with each of these statins. She has been told not to take statins anymore.  3. Hypertension: prev was told she had "white coat" HTN 4. Obesity 5. Restless Leg Syndrome since age 75yo 6. OA  7.  peripheral neuropathy 8. OSA: Mild, does not use CPAP 9. Chronic nausea/diarrhea: ? IBS 10.  Diastolic CHF: Echo (3/11) with normal LV size, moderate LVH, EF 65-70%, LV mid-cavity gradient reaching 63 mmHg with valsalva  but minimal at rest, grade I diastolic dysfunction, cannot estimate PA systolic pressure (no TR doppler signal), RV normal.  RHC (4/11): Mean RA 11, RV 37/13, PA 33/15, mean PCWP 12, CI 3.3.  11. ETT-myoview (4/11): 5'30", stopped due to fatigue, normal EF, no evidence for scar or ischemia.  MD roster: Erline Hau Vassie Loll GI - Gessner endo - Everardo All) Nsurg - Elsner gyn - susane miller cards- Shirlee Latch  Family History: Reviewed history from 07/06/2009 and no changes required. Family History of Alcoholism/Addiction (parent) Family History Diabetes 1st degree relative (grandparent) Family History High cholesterol (parent, grandparent) Family History Hypertension (parent, grandparent) Family History Lung cancer (grandparent) Stomach cancer (grandmother) Celiac Sprue  daughter Mother with MI at 70, father with MI at 23, uncle with MI at 21, brother with stents in his 59s, multiple aunts with MIs and CVAs  Social History: Reviewed history from 05/22/2009 and no changes required. 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   Physical Exam  General:  Well developed, well nourished, in no acute distress. Obese.  Neck:  Neck supple, JVP 8 cm. No masses, thyromegaly or abnormal cervical nodes. Lungs:  Clear bilaterally to auscultation and percussion. Heart:  Non-displaced PMI, chest non-tender; regular rate and rhythm, S1, S2 without rubs or gallops. 2/6 systolic murmur along the sternal border. Carotid upstroke normal, no bruit. Pedals normal pulses. 1+ ankle edema.  Abdomen:  Bowel sounds positive; abdomen  soft and non-tender without masses, organomegaly, or hernias noted. No hepatosplenomegaly. Extremities:  No clubbing or cyanosis. Neurologic:  Alert and oriented x 3. Psych:  Normal affect.   Impression & Recommendations:  Problem # 1:  CHRONIC DIASTOLIC HEART FAILURE (ICD-428.32) LV hypertrophy is likely due to longstanding HTN.  The LV mid-cavity gradient may be due to vigorous contraction of the LV in the setting of significant LVH.  BNP has not been high but could be falsely low due to obesity. Right heart cath showed mildly elevated right heart filling pressures but no pulmonary hypertension and normal PCWP.  She is breathing somewhat better, and I suspect that  beta blockade has helped by lowering the LV mid-cavity gradient (relaxing the heart), leading to lower filling pressures. Continue current doses of Coreg and Lasix.  She does need to find a way to exercise more.  She will look into getting a stationary bike or treadmill for her house.   Problem # 2:  HYPERLIPIDEMIA (ICD-272.4) ? rhabdomyolysis with statins.  She is now on Niaspan, Lovaza, and fenofibrate.   Patient Instructions: 1)  Your physician recommends that you have lab 03/25/10--BMP/BNP 428.32. 2)  Your physician wants you to follow-up in: 6 months. (AUGUST 2012)  You will receive a reminder letter in the mail two months in advance. If you don't receive a letter, please call our office to schedule the follow-up appointment.

## 2010-05-03 ENCOUNTER — Other Ambulatory Visit: Payer: Self-pay | Admitting: Internal Medicine

## 2010-05-03 ENCOUNTER — Encounter: Payer: Self-pay | Admitting: Internal Medicine

## 2010-05-03 ENCOUNTER — Other Ambulatory Visit: Payer: BC Managed Care – PPO

## 2010-05-03 ENCOUNTER — Ambulatory Visit (INDEPENDENT_AMBULATORY_CARE_PROVIDER_SITE_OTHER): Payer: BC Managed Care – PPO | Admitting: Internal Medicine

## 2010-05-03 DIAGNOSIS — I10 Essential (primary) hypertension: Secondary | ICD-10-CM

## 2010-05-03 DIAGNOSIS — E119 Type 2 diabetes mellitus without complications: Secondary | ICD-10-CM

## 2010-05-03 DIAGNOSIS — M25569 Pain in unspecified knee: Secondary | ICD-10-CM

## 2010-05-03 LAB — CREATININE, SERUM: Creatinine, Ser: 1.2 mg/dL (ref 0.4–1.2)

## 2010-05-03 LAB — HEMOGLOBIN A1C: Hgb A1c MFr Bld: 9 % — ABNORMAL HIGH (ref 4.6–6.5)

## 2010-05-11 LAB — POCT I-STAT 3, VENOUS BLOOD GAS (G3P V)
Acid-Base Excess: 2 mmol/L (ref 0.0–2.0)
pH, Ven: 7.357 — ABNORMAL HIGH (ref 7.250–7.300)
pO2, Ven: 39 mmHg (ref 30.0–45.0)

## 2010-05-11 LAB — POCT I-STAT 3, ART BLOOD GAS (G3+)
pCO2 arterial: 55 mmHg — ABNORMAL HIGH (ref 35.0–45.0)
pH, Arterial: 7.363 (ref 7.350–7.400)
pO2, Arterial: 69 mmHg — ABNORMAL LOW (ref 80.0–100.0)

## 2010-05-11 NOTE — Assessment & Plan Note (Signed)
Summary: 3 MTH FU--STC   Vital Signs:  Patient profile:   65 year old female Weight:      227.4 pounds (103.36 kg) O2 Sat:      96 % on Room air Temp:     98.8 degrees F (37.11 degrees C) oral Pulse rate:   72 / minute BP sitting:   112 / 72  (left arm) Cuff size:   large  Vitals Entered By: Orlan Leavens RMA (May 03, 2010 1:04 PM)  O2 Flow:  Room air CC: 3 MONTH FOLLOW-UP Is Patient Diabetic? No Pain Assessment Patient in pain? no        Primary Care Provider:  Newt Lukes MD  CC:  3 MONTH FOLLOW-UP.  History of Present Illness:  here for follow up -  1) DM2 - now on combo metformin + insulin tx - has meet with nutritionist and working on weight loss, does not wish to follow with endo for same - home cbgs reviewed - no hypoglycemia events or symptoms - another md (while visiting dtr in [place]) stopped glyburide in place of metformin - causes nausea  2) HTN -reports compliance with ongoing medical treatment and no changes in medication dose or frequency. denies adverse side effects related to current therapy. no edema, no CP or HA  3) dyslipidemia - intol of statins with adv rxn in past but tol current meds well - lovaza, fenfib and given sample of welchol by dtrs doc -but only taking one daily  4) c/o poor sleep and depression - exac by stress of mother living with her, intol of ambein in past - no daytime sleepiness symptoms - rx for amitripline 6/2011and uses as needed - improved symptoms - also began citalopram 12/2009 for same  cont left knee pain onset >12 mo no swelling chronic popping, pain tearing around medial side when working on knees last w/e  Clinical Review Panels:  Immunizations   Last Tetanus Booster:  Historical (02/22/2004)   Last Flu Vaccine:  Historical (02/20/2009)   Last Pneumovax:  Historical (02/21/2006)  Lipid Management   Cholesterol:  236 (12/02/2009)   LDL (bad choesterol):  115 (12/02/2009)   HDL (good cholesterol):  75  (12/02/2009)   Triglycerides:  232 (12/02/2009)  Diabetes Management   HgBA1C:  7.1 (12/02/2009)   Creatinine:  1.0 (03/25/2010)   Last Flu Vaccine:  Historical (02/20/2009)   Last Pneumovax:  Historical (02/21/2006)  CBC   WBC:  7.5 (12/02/2009)   RBC:  4.03 (12/02/2009)   Hgb:  11.1 (12/02/2009)   Hct:  33.6 (12/02/2009)   Platelets:  287 (12/02/2009)   MCV  83 (12/02/2009)   MCHC  33.7 (06/15/2009)   RDW  13.6 (12/02/2009)   PMN:  52 (12/02/2009)   Lymphs:  38.3 (06/15/2009)   Monos:  7 (12/02/2009)   Eosinophils:  2 (12/02/2009)   Basophil:  0 (12/02/2009)  Complete Metabolic Panel   Glucose:  196 (03/25/2010)   Sodium:  136 (03/25/2010)   Potassium:  4.6 (03/25/2010)   Chloride:  97 (03/25/2010)   CO2:  29 (03/25/2010)   BUN:  39 (03/25/2010)   Creatinine:  1.0 (03/25/2010)   Albumin:  3.8 (12/02/2009)   Total Protein:  6.6 (12/02/2009)   Calcium:  8.9 (03/25/2010)   Total Bili:  0.3 (12/02/2009)   Alk Phos:  40 (11/09/2009)   SGPT (ALT):  22 (12/02/2009)   SGOT (AST):  29 (12/02/2009)   Current Medications (verified): 1)  Aspirin 81 Mg Tabs (  Aspirin) .... Once Daily 2)  Diovan 160 Mg Tabs (Valsartan) .[name].. 1 By Mouth Once Daily 3)  Requip 4 Mg Tabs (Ropinirole Hcl) .... Take 1 By Mouth Once Daily (Or As Directed) 4)  Coq10 30 Mg Caps (Coenzyme Q10) .... Once Daily 5)  Ra Omega 3-6-9  Caps (Misc Natural Products) .... Once Daily 6)  Niaspan 1000 Mg Cr-Tabs (Niacin (Antihyperlipidemic)) .... Two  At Bedtime 7)  Coreg 25 Mg Tabs (Carvedilol) .... One Twice A Day 8)  Lovaza 1 Gm Caps (Omega-3-Acid Ethyl Esters) .... Two Twice Daily 9)  Fenofibrate 160 Mg Tabs (Fenofibrate) .... Take 1 By Mouth Once Daily 10)  Lasix 40 Mg Tabs (Furosemide) .... Take Two Tablets in The Morning and One Tablet in The Evening 11)  Humalog Kwikpen 100 Unit/ml Soln (Insulin Lispro (Human)) .... 6 Units Three Times A Day (Just Before Each Meal), and Pen Needles Three Times A Day 12)   Freestyle Lite Test  Strp (Glucose Blood) .... Use As Directed Three Times A Day 13)  Metformin Hcl 500 Mg Tabs (Metformin Hcl) .... Take 1 Two Times A Day 14)  Citalopram Hydrobromide 20 Mg Tabs (Citalopram Hydrobromide) .... Take 1 By Mouth Once Daily 15)  Bactroban 2 % Oint (Mupirocin) .... Apply To Sore Two Times A Day X 7 Days, Then As Needed  Allergies (verified): 1)  ! Sulfa 2)  Morphine  Past History:  Past Medical History: 1. Diabetes mellitus, type II 2. Hyperlipidemia: She has been unable to tolerate statins (has been on Vytorin, lovastatin, and Crestor) due to what sounds like rhabdo: developed muscle weakness and was told she had "muscle damage" with each of these statins. She has been told not to take statins anymore.  3. Hypertension: prev was told she had "white coat" HTN 4. Obesity 5. Restless Leg Syndrome since age 81yo 6. OA  7. peripheral neuropathy 8. OSA: Mild, does not use CPAP  9. Chronic nausea/diarrhea: ? IBS 10.  Diastolic CHF: Echo (3/11) with normal LV size, moderate LVH, EF 65-70%, LV mid-cavity gradient reaching 63 mmHg with valsalva  but minimal at rest, grade I diastolic dysfunction, cannot estimate PA systolic pressure (no TR doppler signal), RV normal.  RHC (4/11): Mean RA 11, RV 37/13, PA 33/15, mean PCWP 12, CI 3.3.  11. ETT-myoview (4/11): 5'30", stopped due to fatigue, normal EF, no evidence for scar or ischemia.  MD roster: Erline Hau Vassie Loll GI - Gessner endo - Everardo All) Nsurg - Elsner gyn - susane miller cards- Shirlee Latch  Review of Systems  The patient denies fever, weight gain, chest pain, and syncope.    Physical Exam  General:  overweight-appearing.  alert, well-developed, well-nourished, and cooperative to examination.    Lungs:  normal respiratory effort, no intercostal retractions or use of accessory muscles; normal breath sounds bilaterally - no crackles and no wheezes.    Heart:  normal rate, regular rhythm, no murmur, and no rub. BLE  without edema.  Msk:  no fasciulations or atropy - no pain to palp over spine - no joint effusions or deformities left knee: decreased range of motion, diffuse boggy synovitis. Tender to palpation on joint line. Increased pain with weight bearing. Positive crepitus.    Impression & Recommendations:  Problem # 1:  KNEE PAIN, LEFT (ICD-719.46)  ?injury >12 mo ago - cont pain despite no swelling refer to ortho for eval and tx of same Her updated medication list for this problem includes:    Aspirin 81 Mg Tabs (Aspirin) .Marland KitchenMarland KitchenMarland KitchenMarland Kitchen  Once daily  Orders: Orthopedic Surgeon Referral (Ortho Surgeon)  Discussed strengthening exercises, use of ice or heat, and medications.   Problem # 2:  DIABETES MELLITUS, TYPE II (ICD-250.00)  Her updated medication list for this problem includes:    Aspirin 81 Mg Tabs (Aspirin) ..... Once daily    Diovan 160 Mg Tabs (Valsartan) .[name]... 1 by mouth once daily    Humalog Kwikpen 100 Unit/ml Soln (Insulin lispro (human)) .Marland KitchenMarland KitchenMarland KitchenMarland Kitchen 6 units three times a day (just before each meal), and pen needles three times a day    Metformin Hcl 500 Mg Tabs (Metformin hcl) .[name]... Take 1 two times a day  Orders: TLB-A1C / Hgb A1C (Glycohemoglobin) (83036-A1C) TLB-Creatinine, Blood (82565-CREA)  Labs Reviewed: Creat: 1.0 (03/25/2010)    Reviewed HgBA1c results: 7.1 (12/02/2009)  7.7 (11/09/2009)  Problem # 3:  HYPERTENSION (ICD-401.9)  Her updated medication list for this problem includes:    Diovan 160 Mg Tabs (Valsartan) .[name]... 1 by mouth once daily    Coreg 25 Mg Tabs (Carvedilol) ..... One twice a day    Lasix 40 Mg Tabs (Furosemide) .[name]... Take two tablets in the morning and one tablet in the evening  BP today: 112/72 Prior BP: 126/64 (03/24/2010)  Labs Reviewed: K+: 4.6 (03/25/2010) Creat: : 1.0 (03/25/2010)   Chol: 236 (12/02/2009)   HDL: 75 (12/02/2009)   LDL: 115 (12/02/2009)   TG: 232 (12/02/2009)  Problem # 4:  HYPERLIPIDEMIA (ICD-272.4)  Her updated medication  list for this problem includes:    Niaspan 1000 Mg Cr-tabs (Niacin (antihyperlipidemic)) .[name]..[name] Two  at bedtime    Lovaza 1 Gm Caps (Omega-3-acid ethyl esters) .[name]..[name] Two twice daily    Fenofibrate 160 Mg Tabs (Fenofibrate) .[name]... Take 1 by mouth once daily  ? rhabdomyolysis with statins.  She is now on Niaspan, Lovaza, and fenofibrate.   Labs Reviewed: SGOT: 29 (12/02/2009)   SGPT: 22 (12/02/2009)   HDL:75 (12/02/2009), 60.00 (11/09/2009)  LDL:115 (12/02/2009), 127 (08/15/2008)  Chol:236 (12/02/2009), 251 (11/09/2009)  Trig:232 (12/02/2009), 216.0 (11/09/2009)  Complete Medication List: 1)  Aspirin 81 Mg Tabs (Aspirin) .... Once daily 2)  Diovan 160 Mg Tabs (Valsartan) .[name].. 1 by mouth once daily 3)  Requip 4 Mg Tabs (Ropinirole hcl) .... Take 1 by mouth once daily (or as directed) 4)  Coq10 30 Mg Caps (Coenzyme q10) .... Once daily 5)  Ra Omega 3-6-9 Caps (Misc natural products) .... Once daily 6)  Niaspan 1000 Mg Cr-tabs (Niacin (antihyperlipidemic)) .... Two  at bedtime 7)  Coreg 25 Mg Tabs (Carvedilol) .... One twice a day 8)  Lovaza 1 Gm Caps (Omega-3-acid ethyl esters) .... Two twice daily 9)  Fenofibrate 160 Mg Tabs (Fenofibrate) .... Take 1 by mouth once daily 10)  Lasix 40 Mg Tabs (Furosemide) .... Take two tablets in the morning and one tablet in the evening 11)  Humalog Kwikpen 100 Unit/ml Soln (Insulin lispro (human)) .... 6 units three times a day (just before each meal), and pen needles three times a day 12)  Freestyle Lite Test Strp (Glucose blood) .... Use as directed three times a day 13)  Metformin Hcl 500 Mg Tabs (Metformin hcl) .... Take 1 two times a day 14)  Citalopram Hydrobromide 20 Mg Tabs (Citalopram hydrobromide) .... Take 1 by mouth once daily  Patient Instructions: 1)  it was good to see you today. 2)  medications reviewed and updated - refills done as needed 3)  test(s) ordered today - your results will be posted on the  phone tree for review in 48-72 hours  from the time of test completion; call [phone] and enter your 9 digit MRN (listed above on this page, just below your name); if any changes need to be made or there are abnormal results, you will be contacted directly.  4)  we'll make referral to orthopedics for your left knee pain. Our office will contact you regarding this appointment once made.  5)  Please schedule a follow-up appointment in 3-4 months for diabetes labs, call sooner if problems.  Prescriptions: REQUIP 4 MG TABS (ROPINIROLE HCL) take 1 by mouth once daily (or as directed)  #30 x 6   Entered and Authorized by:   Newt Lukes MD   Signed by:   Newt Lukes MD on 05/03/2010   Method used:   Electronically to        [facility] [address] (retail)       [address]       [place], [place]  [address]       Ph: [phone] or [phone]       Fax: [phone]   RxID:   [id number] DIOVAN 160 MG TABS (VALSARTAN) 1 by mouth once daily  #30 x 11   Entered and Authorized by:   Newt Lukes MD   Signed by:   Newt Lukes MD on 05/03/2010   Method used:   Electronically to        [facility] [address] (retail)       [address]       [place], [place]  [address]       Ph: [phone] or [phone]       Fax: [phone]   RxID:   [id number]    Orders Added: 1)  Est. Patient Level IV [70623] 2)  TLB-A1C / Hgb A1C (Glycohemoglobin) [83036-A1C] 3)  Orthopedic Surgeon Referral [Ortho Surgeon] 4)  TLB-Creatinine, Blood [82565-CREA]

## 2010-05-25 ENCOUNTER — Other Ambulatory Visit: Payer: Self-pay | Admitting: Cardiology

## 2010-05-26 ENCOUNTER — Other Ambulatory Visit: Payer: Self-pay | Admitting: Cardiology

## 2010-06-15 ENCOUNTER — Other Ambulatory Visit: Payer: Self-pay | Admitting: Orthopedic Surgery

## 2010-06-15 DIAGNOSIS — M5136 Other intervertebral disc degeneration, lumbar region: Secondary | ICD-10-CM

## 2010-06-23 ENCOUNTER — Other Ambulatory Visit: Payer: Self-pay | Admitting: Cardiology

## 2010-06-23 ENCOUNTER — Other Ambulatory Visit: Payer: Self-pay | Admitting: Internal Medicine

## 2010-06-24 ENCOUNTER — Ambulatory Visit
Admission: RE | Admit: 2010-06-24 | Discharge: 2010-06-24 | Disposition: A | Discharge status: Home / Self Care | Payer: Medicare Other | Source: Ambulatory Visit | Attending: Orthopedic Surgery | Admitting: Orthopedic Surgery

## 2010-06-24 DIAGNOSIS — M5136 Other intervertebral disc degeneration, lumbar region: Secondary | ICD-10-CM

## 2010-07-06 NOTE — H&P (Signed)
NAMESHAUNTELL, IGLESIA NO.:  [id number]   MEDICAL RECORD NO.:  [id number]          PATIENT TYPE:  INP   LOCATION:                               FACILITY:  [facility]   PHYSICIAN:  Juanetta Gosling, MDDATE OF BIRTH:  05/11/1945   DATE OF ADMISSION:  01/13/2008  DATE OF DISCHARGE:                              HISTORY & PHYSICAL   CHIEF COMPLAINT:  Abdominal pain, nausea, vomiting.   HISTORY:  Mrs. Brazzle is a 65 year old female with a long-standing  over at least a couple years history of worsening chronic nausea and  vomiting that she initially attributes to her metformin and her Actos  and stopped those but has continued and has worsened recently.  In the  last 24 hours, she has had some bilious emesis.  She describes having a  fair amount of weakness since Thursday.  No fevers.  Has had a loose  stool during this time and describes some abdominal pain but mostly has  back pain currently.  She does have a history of what sounds like a heme-  positive stools for which she underwent an endoscopy and was found to  have some sort of ulcer in the remote past.  She said she was treated  just with some medication at that point and has had no symptoms since  then at all until the recent ones.  Her past history includes what  sounds like mitral valve regurgitation, bundle-branch block, diabetes  mellitus, and hypercholesterolemia.   Past surgical include lap chole, umbilical hernia, and uterine polyps.   Social history is negative for tobacco, alcohol, and she denies NSAIDs.   She has no known drug allergies, although with SULFA she does have  diarrhea.   Medications right now just include ReQuip.   Review of systems is otherwise normal, although she has had some  wheezing of late.   Physical exam shows a temp of 97.8, pulse of 88, respirations 18, blood  pressure 120/54.  In general, she is an obese female in no distress.  Neck is supple.  Lungs are clear  bilaterally.  Heart is regular rate and  rhythm.  Abdomen is soft, obese, well-healed scars.  She is minimally  tender to palpation around her periumbilical region.  Extremities show  no edema.   Her labs available at this time show UA without evidence of infection.  White blood cell count was 19.9, hematocrit 38.5, platelets 285.  Sodium  137, potassium 4, chloride 101, BUN 12, creatinine 0.6, glucose of 148.  A CT scan shows inflammation of the second and third portion of the  duodenum with air in the wall or some that might be slightly extrinsic  with some soft tissue stranding present.  She has acute abdominal series  without any free air.   IMPRESSION:  Duodenitis and possible ulcer disease.  Other etiologies  include tumors, Crohn disease.   PLAN:  She has a history of ulcer disease.  I think this is the most  likely cause that this is an acute on chronic attack of this given her  history.  She sounds more like  she has a gastric outlet obstruction that  has slowly been developing over time.  The area is not typical for a  duodenal ulcer on her scan and I think she has a fair amount of  duodenitis associated with what at worse could be a contained  perforation, although I am not sure that this is even what is going on  there.  She was given extra contrast descending the duodenum well and  there is no extravasation of any contrast on her film currently.  She is  afebrile, although her white count is elevated.  She certainly is not  toxic right now and has no evidence of any free air potentially.  I  think the best course of action would be for her to place an NG tube.  We will give her Zosyn and Protonix IV drip.  We will check her H.  pylori status.  We will make her n.p.o. and continue with nasogastric  tube suction with the idea of getting this duodenitis to hopefully  resolve with the potential for an endoscopy.  I will also ensure we  check her lipase and her LFTs as  pancreatitis could also potentially  cause this picture especially if located in the uncinate process or  lower portion of the pancreas.  Discussed this plan with her and her  family and they are all agreeable to this.  We also discussed possible  use of TNA if necessary as well.      Juanetta Gosling, MD  Electronically Signed     MCW/MEDQ  D:  01/13/2008  T:  01/14/2008  Job:  [id number]

## 2010-07-09 NOTE — Discharge Summary (Signed)
NAMEKATHARINA, JEHLE           ACCOUNT NO.:  [id number]   MEDICAL RECORD NO.:  [id number]          PATIENT TYPE:  INP   LOCATION:  5124                         FACILITY:  [facility]   PHYSICIAN:  Gabrielle Dare. Janee Morn, M.D.DATE OF BIRTH:  December 09, 1945   DATE OF ADMISSION:  01/13/2008  DATE OF DISCHARGE:  01/20/2008                               DISCHARGE SUMMARY   DISCHARGE DIAGNOSES:  1. Duodenitis.  2. Elevated liver function tests.  3. Status post endoscopic retrograde cholangiopancreatography.   HISTORY OF PRESENT ILLNESS:  Ms. Porto is a 65 year old female with  a history of chronic nausea and vomiting thought to be associated with  diabetic gastroparesis who was admitted on January 13, 2008 with  significant nausea and vomiting.   HOSPITAL COURSE:  CT scan at that time demonstrated duodenitis.  She was  admitted to the General Surgery Service.  H. pylori results were  checked.  GI consultation was obtained on the following day, [facility] and Dr. Arlyce Dice.  She had an increased liver function test including  AST 200, ALT 125, total bilirubin 5.2, alkaline phosphatase 244, it is  GI suspected.  She had a retained common bile duct stone.  She underwent  ERCP on January 16, 2008.  No obvious stones were extricated.  Sphincterotomy was done, her symptoms gradually improved.  She was  maintained on IV proton pump inhibitors.  She was covered for nutrition  with TNA, which was gradually weaned off as she tolerated initially a  liquid diet.  Her liver function tests gradually improved.  She  continued to improve symptomatically.  When she was taking liquids  better and her BM was under control, she was discharged home in stable  condition on January 20, 2008 with plans to follow up in Dr. Marzetta Board  office with GI in 1-2 weeks.  At that time, they will be checking  followup liver function tests.   DISCHARGE DIET:  Carbohydrate modified diabetic.   DISCHARGE MEDICATIONS:  1.  ReQuip 2 mg b.i.d.  2. Fish oil supplement.  3. Aspirin 81 mg daily.  4. Vitamin B12 supplement.  5. Vitamin D supplement.  6. Chromium tablets and cinnamon tablets as supplements.  She was also      sent on Protonix 40 mg p.o. daily and follow up with [place] in 1-2      weeks with Dr. Arlyce Dice from [facility].     Gabrielle Dare Janee Morn, M.D.  Electronically Signed    BET/MEDQ  D:  02/28/2008  T:  02/29/2008  Job:  [id number]   cc:   [facility]

## 2010-07-09 NOTE — Op Note (Signed)
[name], Melissa Myers           ACCOUNT NO.:  [id number]   MEDICAL RECORD NO.:  [id number]          PATIENT TYPE:  AMB   LOCATION:  SDC                           FACILITY:  [facility]   PHYSICIAN:  [place] B. Earlene Plater, M.D.  DATE OF BIRTH:  August 24, 1945   DATE OF PROCEDURE:  03/06/2006  DATE OF DISCHARGE:                               OPERATIVE REPORT   PREOPERATIVE DIAGNOSES:  1. Postmenopausal bleeding.  2. Endometrial polyp.   POSTOPERATIVE DIAGNOSES:  1. Postmenopausal bleeding.  2. Endometrial polyp.   PROCEDURES:  1. Hysteroscopy with resection of polyps.  2. Resection of submucosal myoma.   SURGEON:  Chester Holstein. Earlene Plater, M.D.   ASSISTANT:  None.   ANESTHESIA:  General anesthesia and 20 mL 1% Nesacaine paracervical  block.   SPECIMENS:  Endometrial masses to pathology.   BLOOD LOSS:  Minimal.   COMPLICATIONS:  None.   FLUID DEFICIT:  50 mL.   INDICATIONS FOR PROCEDURE:  Patient with the above history.  Ultrasound  suggestive of large endometrial polyp.  Patient advised of the risks of  surgery including infection, bleeding, uterine perforation, damage to  surrounding organs and fluid overload.   DESCRIPTION OF PROCEDURE:  Patient taken to the operating room and  general anesthesia obtained.  She was placed in Rotonda stirrups, prepped  and draped in standard fashion.  Bladder emptied with in and out cath.  Examination under anesthesia showed normal size mid plane to slightly  anteverted uterus.  No adnexal masses.  Speculum inserted.  Paracervical  block placed.  Cervix grasped with single-tooth and cervix dilated to  #21.  The diagnostic hysteroscope was inserted after being flushed with  Sorbitol.  Uterine distention was obtained.  Large endometrial mass  noted.  Attempt was made to grasp this with round stone forceps,  however, it was too large to get in the jaws of grasper.  Therefore, I  converted to the resectoscope after dilating up to the appropriate  diameter for  the small resectoscope.   Single loop was used and the endometrial polyp resected to its base.  It  was about 2 cm in diameter.  After removal of this polyp, additional  masses were seen.  There was a smaller polyp arising from the right  anterior fundal area and a small submucous myoma which was less than 5  mm in diameter resected from the left lateral aspect of the uterine  cavity.  The cavity was hemostatic after resection.  The instruments  were removed.  Patient tolerated the procedure well with no  complications.  She was taken to the recovery room awake and in stable  condition.      Gerri Spore B. Earlene Plater, M.D.  Electronically Signed     WBD/MEDQ  D:  03/06/2006  T:  03/06/2006  Job:  [id number]

## 2010-07-13 ENCOUNTER — Other Ambulatory Visit: Payer: Self-pay | Admitting: Endocrinology

## 2010-07-16 ENCOUNTER — Other Ambulatory Visit: Payer: Self-pay | Admitting: *Deleted

## 2010-07-16 MED ORDER — FUROSEMIDE 40 MG PO TABS: 40.0000 mg | ORAL_TABLET | Freq: Two times a day (BID) | ORAL | Status: DC

## 2010-07-21 ENCOUNTER — Ambulatory Visit (INDEPENDENT_AMBULATORY_CARE_PROVIDER_SITE_OTHER): Payer: Medicare Other | Admitting: Internal Medicine

## 2010-07-21 ENCOUNTER — Other Ambulatory Visit: Payer: Self-pay

## 2010-07-21 ENCOUNTER — Encounter: Payer: Self-pay | Admitting: Internal Medicine

## 2010-07-21 ENCOUNTER — Other Ambulatory Visit (INDEPENDENT_AMBULATORY_CARE_PROVIDER_SITE_OTHER): Payer: Medicare Other

## 2010-07-21 DIAGNOSIS — I5033 Acute on chronic diastolic (congestive) heart failure: Secondary | ICD-10-CM

## 2010-07-21 DIAGNOSIS — R0609 Other forms of dyspnea: Secondary | ICD-10-CM

## 2010-07-21 DIAGNOSIS — L03119 Cellulitis of unspecified part of limb: Secondary | ICD-10-CM

## 2010-07-21 LAB — CBC WITH DIFFERENTIAL/PLATELET
Basophils Absolute: 0.1 10*3/uL (ref 0.0–0.1)
Eosinophils Absolute: 0.2 10*3/uL (ref 0.0–0.7)
Hemoglobin: 11.2 g/dL — ABNORMAL LOW (ref 12.0–15.0)
Lymphocytes Relative: 34.1 % (ref 12.0–46.0)
MCHC: 33.7 g/dL (ref 30.0–36.0)
Monocytes Relative: 6.3 % (ref 3.0–12.0)
Neutro Abs: 5.2 10*3/uL (ref 1.4–7.7)
Neutrophils Relative %: 56.8 % (ref 43.0–77.0)
RDW: 15.9 % — ABNORMAL HIGH (ref 11.5–14.6)

## 2010-07-21 LAB — BASIC METABOLIC PANEL
Calcium: 9.1 mg/dL (ref 8.4–10.5)
Creatinine, Ser: 0.9 mg/dL (ref 0.4–1.2)
GFR: 64.3 mL/min (ref >60.00)
Glucose, Bld: 152 mg/dL — ABNORMAL HIGH (ref 70–99)
Sodium: 139 mEq/L (ref 135–145)

## 2010-07-21 LAB — HEPATIC FUNCTION PANEL
ALT: 27 U/L (ref 0–35)
AST: 36 U/L (ref 0–37)
Albumin: 3 g/dL — ABNORMAL LOW (ref 3.5–5.2)
Alkaline Phosphatase: 52 U/L (ref 39–117)
Total Bilirubin: 0.5 mg/dL (ref 0.3–1.2)

## 2010-07-21 MED ORDER — FUROSEMIDE 80 MG PO TABS: 80.0000 mg | ORAL_TABLET | Freq: Three times a day (TID) | ORAL | Status: DC

## 2010-07-21 MED ORDER — POTASSIUM CHLORIDE ER 10 MEQ PO TBCR: 10.0000 meq | EXTENDED_RELEASE_TABLET | Freq: Two times a day (BID) | ORAL | Status: DC

## 2010-07-21 MED ORDER — CEPHALEXIN 500 MG PO CAPS: 500.0000 mg | ORAL_CAPSULE | Freq: Four times a day (QID) | ORAL | Status: DC

## 2010-07-21 NOTE — Patient Instructions (Signed)
It was good to see you today. Increase Lasix dose and take antibiotics for leg swelling and redness - Your prescription(s) have been submitted to your pharmacy. Please take as directed and contact our office if you believe you are having problem(s) with the medication(s). Test(s) ordered today. Your results will be called to you after review (48-72hours after test completion). If any changes need to be made, you will be notified at that time. Please schedule follow up for early next week, call sooner if problems.

## 2010-07-21 NOTE — Progress Notes (Signed)
  Subjective:    Patient ID: Melissa Myers, female    DOB: 02-08-1946, 65 y.o.   MRN: [id number]  HPI complains of edema - affects BLE equally Progressive swelling in last 2 weeks Now redness and blisters in last 72h on top of swelling No fever but +weight gain and dyspnea on exertion - also paroxysmal nocturnal dyspnea No cough, chest pain or sputum, no change meds   Past Medical History  Diagnosis Date  . Type II or unspecified type diabetes mellitus without mention of complication, not stated as uncontrolled   . Hyperlipidemia     hx rhabdo on statins  . Hypertension   . CHF (congestive heart failure)     diastolic  . OSA (obstructive sleep apnea)     intol of CPAP  . Chronic diarrhea     a/w nausea - felt related to IBS  . RLS (restless legs syndrome)   . Osteoarthritis      Review of Systems  Constitutional: Positive for fatigue.  Respiratory: Negative for chest tightness and wheezing.   Cardiovascular: Negative for palpitations.  Genitourinary: Negative for difficulty urinating.       Objective:   Physical Exam BP 142/72  Pulse 86  Temp(Src) 98.3 F (36.8 C) (Oral)  Ht 5\' 1"  (1.549 m)  Wt 240 lb (108.863 kg)  BMI 45.35 kg/m2  SpO2 99% Wt Readings from Last 3 Encounters:  07/21/10 240 lb (108.863 kg)  05/03/10 227 lb 6.4 oz (103.148 kg)  03/24/10 229 lb (103.874 kg)   Physical Exam  Constitutional: She is overweight; oriented to person, place, and time. She appears well-developed and well-nourished. No distress. son in law at side Neck: Thick; Normal range of motion. Neck supple. No JVD present. No thyromegaly present.  Cardiovascular: Normal rate, regular rhythm and normal heart sounds.  No murmur heard. 2+ pitting BLE edema mid calf down including feet, +weeping. Pulmonary/Chest: Effort normal and breath sounds normal. No respiratory distress. She has no wheezes.  Abdominal: Soft. Bowel sounds are normal. She exhibits no distension. There is no  tenderness. Skin: Mild diffuse erythema of distal BLE c/w cellulitis - few pustules on R>L anterior shin near weeping skin.  Psychiatric: She has a normal mood and affect. Her behavior is normal. Judgment and thought content normal.        Lab Results  Component Value Date   WBC 7.5 12/02/2009   HGB 11.1 12/02/2009   HCT 33.6 12/02/2009   PLT 287 12/02/2009   CHOL 236 12/02/2009   TRIG 216.0* 11/09/2009   HDL 75 12/02/2009   LDLDIRECT 144.8 11/09/2009   ALT 22 12/02/2009   AST 29 12/02/2009   NA 136 03/25/2010   K 4.6 03/25/2010   CL 97 03/25/2010   CREATININE 1.2 05/03/2010   BUN 39* 03/25/2010   CO2 29 03/25/2010   TSH 1.35 05/29/2009   INR 1.0 ratio 06/15/2009   HGBA1C 9.0* 05/03/2010     Assessment & Plan:  See problem list. Medications and labs reviewed today.  Acute on chronic diastolic HF - weight gain with 2 week progressive edema and PND - check labs, increase diuretics and add potassium - recheck next week for further testing as needed - last Echo 04/2009 with LVEF 70% and grade 1 diast dysfx - may also need re-eval by cards or pulm (untreated OSA)  Cellulitis BLE - keflex - erx done

## 2010-07-26 ENCOUNTER — Ambulatory Visit (INDEPENDENT_AMBULATORY_CARE_PROVIDER_SITE_OTHER): Payer: Medicare Other | Admitting: Internal Medicine

## 2010-07-26 ENCOUNTER — Other Ambulatory Visit (INDEPENDENT_AMBULATORY_CARE_PROVIDER_SITE_OTHER): Payer: Medicare Other

## 2010-07-26 ENCOUNTER — Encounter: Payer: Self-pay | Admitting: Internal Medicine

## 2010-07-26 DIAGNOSIS — G4710 Hypersomnia, unspecified: Secondary | ICD-10-CM

## 2010-07-26 DIAGNOSIS — I5033 Acute on chronic diastolic (congestive) heart failure: Secondary | ICD-10-CM

## 2010-07-26 DIAGNOSIS — G2581 Restless legs syndrome: Secondary | ICD-10-CM

## 2010-07-26 LAB — BASIC METABOLIC PANEL
GFR: 44.07 mL/min — ABNORMAL LOW (ref >60.00)
Potassium: 4.5 mEq/L (ref 3.5–5.1)
Sodium: 137 mEq/L (ref 135–145)

## 2010-07-26 NOTE — Progress Notes (Signed)
  Subjective:    Patient ID: Melissa Myers, female    DOB: 25-Jun-1945, 66 y.o.   MRN: [id number]  HPI here for follow up - edema/cellulitis -  Seen here 5 days ago for same - on increase furosemide and keflex Continued dyspnea on exertion - also paroxysmal nocturnal dyspnea "smothering" if lays flat No cough, chest pain or sputum, no change meds  Falls asleep during daytime, 2 events while driving  Past Medical History  Diagnosis Date  . Type II or unspecified type diabetes mellitus without mention of complication, not stated as uncontrolled   . Hyperlipidemia     hx rhabdo on statins  . Hypertension   . CHF (congestive heart failure)     diastolic  . OSA (obstructive sleep apnea)     insignificant apnea events 05/2009 sleep study  . Chronic diarrhea     a/w nausea - felt related to IBS  . RLS (restless legs syndrome)   . Osteoarthritis      Review of Systems  Constitutional: Positive for fatigue. Negative for unexpected weight change.  Respiratory: Negative for chest tightness and wheezing.   Genitourinary: Positive for decreased urine volume.       Objective:   Physical Exam BP 138/76  Pulse 78  Temp(Src) 97.8 F (36.6 C) (Oral)  Resp 16  Wt 238 lb 8 oz (108.183 kg)  SpO2 96% Wt Readings from Last 3 Encounters:  07/26/10 238 lb 8 oz (108.183 kg)  07/21/10 240 lb (108.863 kg)  05/03/10 227 lb 6.4 oz (103.148 kg)   Physical Exam  Constitutional: She is overweight; oriented to person, place, and time. She appears well-developed and well-nourished. No distress. dtr in law at side Neck: Thick; Normal range of motion. Neck supple. No JVD present. No thyromegaly present.  Cardiovascular: Normal rate, regular rhythm and normal heart sounds.  No murmur heard. improved 1+ chronic BLE edema Pulmonary/Chest: Effort normal and breath sounds normal. No respiratory distress. She has no wheezes.  Abdominal: Soft. Bowel sounds are normal. She exhibits no distension. There is  no tenderness. Skin: resolved erythema of distal BLE  - no pustules on anterior shin or weeping as before Psychiatric: She has a normal mood and affect. Her behavior is normal. Judgment and thought content normal.        Lab Results  Component Value Date   WBC 9.2 07/21/2010   HGB 11.2* 07/21/2010   HCT 33.2* 07/21/2010   PLT 287.0 07/21/2010   CHOL 236 12/02/2009   TRIG 216.0* 11/09/2009   HDL 75 12/02/2009   LDLDIRECT 144.8 11/09/2009   ALT 27 07/21/2010   AST 36 07/21/2010   NA 139 07/21/2010   K 4.4 07/21/2010   CL 99 07/21/2010   CREATININE 0.9 07/21/2010   BUN 27* 07/21/2010   CO2 30 07/21/2010   TSH 1.35 05/29/2009   INR 1.0 ratio 06/15/2009   HGBA1C 9.0* 05/03/2010     Assessment & Plan:  See problem list. Medications and labs reviewed today.  Acute on chronic diastolic HF - improved edema but continued PND and little weight loss - recheck labs, increase diuretics x 5 days and restrict free water intake- recheck echo now- last Echo 04/2009 with LVEF 70% and grade 1 diast dysfx - may also need re-eval by cards   Cellulitis BLE - improved with keflex -   Hypersomnia - no apnea 05/2009 sleep study - ?med related or toher problem - follow up pulm (alva) now

## 2010-07-26 NOTE — Progress Notes (Signed)
Pt. Informed.

## 2010-07-26 NOTE — Patient Instructions (Signed)
It was good to see you today. Medications reviewed, updates done at this time. Increase lasix to 80mg  - 2 tabs twice daily -x 5 days, then resume 80mg  twice daily; finish Keflex antibiotics as prescribed we'll make referral to sleep follow up with Vassie Loll and for 2d echo. Our office will contact you regarding appointment(s) once made. Please schedule followup in 3-4 months, call sooner if problems.

## 2010-07-26 NOTE — Assessment & Plan Note (Signed)
On Requip for same for years - try adjust dose to qhs only to see if this helps sleep issues -  follow up pulm as discussed above -  Dr. Vassie Loll Sleep study 05/2009 reviewed - no significant apnea events

## 2010-08-04 ENCOUNTER — Ambulatory Visit: Payer: BC Managed Care – PPO | Admitting: Internal Medicine

## 2010-08-05 ENCOUNTER — Other Ambulatory Visit (HOSPITAL_COMMUNITY): Payer: Medicare Other | Admitting: Radiology

## 2010-08-06 ENCOUNTER — Other Ambulatory Visit: Payer: Self-pay | Admitting: Internal Medicine

## 2010-08-13 ENCOUNTER — Ambulatory Visit: Payer: Medicare Other | Admitting: Pulmonary Disease

## 2010-10-21 ENCOUNTER — Other Ambulatory Visit: Payer: Self-pay | Admitting: *Deleted

## 2010-10-21 MED ORDER — INSULIN LISPRO 100 UNIT/ML ~~LOC~~ SOLN: 8.0000 [IU] | Freq: Three times a day (TID) | SUBCUTANEOUS | Status: DC

## 2010-10-28 ENCOUNTER — Ambulatory Visit: Payer: Medicare Other | Admitting: Internal Medicine

## 2010-11-23 LAB — GLUCOSE, CAPILLARY
Glucose-Capillary: 141 mg/dL — ABNORMAL HIGH (ref 70–99)
Glucose-Capillary: 156 mg/dL — ABNORMAL HIGH (ref 70–99)
Glucose-Capillary: 173 mg/dL — ABNORMAL HIGH (ref 70–99)
Glucose-Capillary: 176 mg/dL — ABNORMAL HIGH (ref 70–99)
Glucose-Capillary: 178 mg/dL — ABNORMAL HIGH (ref 70–99)
Glucose-Capillary: 179 mg/dL — ABNORMAL HIGH (ref 70–99)
Glucose-Capillary: 182 mg/dL — ABNORMAL HIGH (ref 70–99)
Glucose-Capillary: 182 mg/dL — ABNORMAL HIGH (ref 70–99)
Glucose-Capillary: 186 mg/dL — ABNORMAL HIGH (ref 70–99)
Glucose-Capillary: 190 mg/dL — ABNORMAL HIGH (ref 70–99)
Glucose-Capillary: 192 mg/dL — ABNORMAL HIGH (ref 70–99)
Glucose-Capillary: 192 mg/dL — ABNORMAL HIGH (ref 70–99)
Glucose-Capillary: 194 mg/dL — ABNORMAL HIGH (ref 70–99)
Glucose-Capillary: 196 mg/dL — ABNORMAL HIGH (ref 70–99)
Glucose-Capillary: 215 mg/dL — ABNORMAL HIGH (ref 70–99)
Glucose-Capillary: 233 mg/dL — ABNORMAL HIGH (ref 70–99)

## 2010-11-23 LAB — AMYLASE: Amylase: 24 U/L — ABNORMAL LOW (ref 27–131)

## 2010-11-23 LAB — CBC
HCT: 30.4 % — ABNORMAL LOW (ref 36.0–46.0)
HCT: 31.1 % — ABNORMAL LOW (ref 36.0–46.0)
HCT: 31.7 % — ABNORMAL LOW (ref 36.0–46.0)
HCT: 34 % — ABNORMAL LOW (ref 36.0–46.0)
Hemoglobin: 10.6 g/dL — ABNORMAL LOW (ref 12.0–15.0)
Hemoglobin: 10.8 g/dL — ABNORMAL LOW (ref 12.0–15.0)
Hemoglobin: 11.3 g/dL — ABNORMAL LOW (ref 12.0–15.0)
Hemoglobin: 12.9 g/dL (ref 12.0–15.0)
MCHC: 33.1 g/dL (ref 30.0–36.0)
MCHC: 33.4 g/dL (ref 30.0–36.0)
MCHC: 33.8 g/dL (ref 30.0–36.0)
MCHC: 34.1 g/dL (ref 30.0–36.0)
MCHC: 34.4 g/dL (ref 30.0–36.0)
MCV: 82.2 fL (ref 78.0–100.0)
MCV: 82.8 fL (ref 78.0–100.0)
MCV: 83 fL (ref 78.0–100.0)
MCV: 84.1 fL (ref 78.0–100.0)
RBC: 3.62 MIL/uL — ABNORMAL LOW (ref 3.87–5.11)
RBC: 3.67 MIL/uL — ABNORMAL LOW (ref 3.87–5.11)
RBC: 3.71 MIL/uL — ABNORMAL LOW (ref 3.87–5.11)
RBC: 3.78 MIL/uL — ABNORMAL LOW (ref 3.87–5.11)
RBC: 3.84 MIL/uL — ABNORMAL LOW (ref 3.87–5.11)
RBC: 4.05 MIL/uL (ref 3.87–5.11)
RBC: 4.65 MIL/uL (ref 3.87–5.11)
RDW: 16.5 % — ABNORMAL HIGH (ref 11.5–15.5)
WBC: 11.1 10*3/uL — ABNORMAL HIGH (ref 4.0–10.5)
WBC: 11.7 10*3/uL — ABNORMAL HIGH (ref 4.0–10.5)
WBC: 8.3 10*3/uL (ref 4.0–10.5)
WBC: 9.3 10*3/uL (ref 4.0–10.5)

## 2010-11-23 LAB — COMPREHENSIVE METABOLIC PANEL
ALT: 125 U/L — ABNORMAL HIGH (ref 0–35)
ALT: 54 U/L — ABNORMAL HIGH (ref 0–35)
AST: 100 U/L — ABNORMAL HIGH (ref 0–37)
AST: 251 U/L — ABNORMAL HIGH (ref 0–37)
AST: 317 U/L — ABNORMAL HIGH (ref 0–37)
AST: 43 U/L — ABNORMAL HIGH (ref 0–37)
Alkaline Phosphatase: 244 U/L — ABNORMAL HIGH (ref 39–117)
Alkaline Phosphatase: 342 U/L — ABNORMAL HIGH (ref 39–117)
BUN: 5 mg/dL — ABNORMAL LOW (ref 6–23)
BUN: 9 mg/dL (ref 6–23)
BUN: 9 mg/dL (ref 6–23)
BUN: 9 mg/dL (ref 6–23)
CO2: 26 mEq/L (ref 19–32)
CO2: 29 mEq/L (ref 19–32)
CO2: 29 mEq/L (ref 19–32)
Calcium: 7.8 mg/dL — ABNORMAL LOW (ref 8.4–10.5)
Calcium: 8.3 mg/dL — ABNORMAL LOW (ref 8.4–10.5)
Calcium: 8.4 mg/dL (ref 8.4–10.5)
Calcium: 8.9 mg/dL (ref 8.4–10.5)
Chloride: 102 mEq/L (ref 96–112)
Chloride: 104 mEq/L (ref 96–112)
Chloride: 94 mEq/L — ABNORMAL LOW (ref 96–112)
Creatinine, Ser: 0.46 mg/dL (ref 0.4–1.2)
Creatinine, Ser: 0.52 mg/dL (ref 0.4–1.2)
Creatinine, Ser: 0.55 mg/dL (ref 0.4–1.2)
GFR calc Af Amer: >60 mL/min (ref >60)
GFR calc Af Amer: >60 mL/min (ref >60)
GFR calc Af Amer: >60 mL/min (ref >60)
GFR calc Af Amer: >60 mL/min (ref >60)
GFR calc non Af Amer: >60 mL/min (ref >60)
GFR calc non Af Amer: >60 mL/min (ref >60)
GFR calc non Af Amer: >60 mL/min (ref >60)
Glucose, Bld: 138 mg/dL — ABNORMAL HIGH (ref 70–99)
Glucose, Bld: 151 mg/dL — ABNORMAL HIGH (ref 70–99)
Glucose, Bld: 172 mg/dL — ABNORMAL HIGH (ref 70–99)
Glucose, Bld: 203 mg/dL — ABNORMAL HIGH (ref 70–99)
Glucose, Bld: 216 mg/dL — ABNORMAL HIGH (ref 70–99)
Potassium: 3.6 mEq/L (ref 3.5–5.1)
Potassium: 3.6 mEq/L (ref 3.5–5.1)
Sodium: 137 mEq/L (ref 135–145)
Sodium: 138 mEq/L (ref 135–145)
Total Bilirubin: 2 mg/dL — ABNORMAL HIGH (ref 0.3–1.2)
Total Bilirubin: 4 mg/dL — ABNORMAL HIGH (ref 0.3–1.2)
Total Bilirubin: 5.2 mg/dL — ABNORMAL HIGH (ref 0.3–1.2)
Total Protein: 5.2 g/dL — ABNORMAL LOW (ref 6.0–8.3)
Total Protein: 6 g/dL (ref 6.0–8.3)

## 2010-11-23 LAB — POCT URINALYSIS DIP (DEVICE)
Nitrite: NEGATIVE
Protein, ur: 30 mg/dL — AB
pH: 7.5 (ref 5.0–8.0)

## 2010-11-23 LAB — CARDIAC PANEL(CRET KIN+CKTOT+MB+TROPI)
CK, MB: 1.3 ng/mL (ref 0.3–4.0)
CK, MB: 1.6 ng/mL (ref 0.3–4.0)
Relative Index: INVALID (ref 0.0–2.5)
Total CK: 44 U/L (ref 7–177)
Total CK: 67 U/L (ref 7–177)

## 2010-11-23 LAB — DIFFERENTIAL
Basophils Absolute: 0 10*3/uL (ref 0.0–0.1)
Basophils Absolute: 0.1 10*3/uL (ref 0.0–0.1)
Basophils Relative: 0 % (ref 0–1)
Basophils Relative: 1 % (ref 0–1)
Basophils Relative: 1 % (ref 0–1)
Eosinophils Absolute: 0 10*3/uL (ref 0.0–0.7)
Eosinophils Absolute: 0 10*3/uL (ref 0.0–0.7)
Monocytes Absolute: 0.6 10*3/uL (ref 0.1–1.0)
Monocytes Absolute: 0.7 10*3/uL (ref 0.1–1.0)
Monocytes Absolute: 1.1 10*3/uL — ABNORMAL HIGH (ref 0.1–1.0)
Monocytes Relative: 5 % (ref 3–12)
Monocytes Relative: 6 % (ref 3–12)
Neutro Abs: 5.2 10*3/uL (ref 1.7–7.7)
Neutro Abs: 9.7 10*3/uL — ABNORMAL HIGH (ref 1.7–7.7)
Neutrophils Relative %: 83 % — ABNORMAL HIGH (ref 43–77)

## 2010-11-23 LAB — BASIC METABOLIC PANEL
CO2: 25 mEq/L (ref 19–32)
CO2: 31 mEq/L (ref 19–32)
Calcium: 8.4 mg/dL (ref 8.4–10.5)
Chloride: 103 mEq/L (ref 96–112)
Creatinine, Ser: 0.58 mg/dL (ref 0.4–1.2)
GFR calc Af Amer: >60 mL/min (ref >60)
GFR calc Af Amer: >60 mL/min (ref >60)
GFR calc non Af Amer: >60 mL/min (ref >60)
Potassium: 3.8 mEq/L (ref 3.5–5.1)
Sodium: 139 mEq/L (ref 135–145)

## 2010-11-23 LAB — CREATININE, SERUM
Creatinine, Ser: 0.61 mg/dL (ref 0.4–1.2)
GFR calc Af Amer: >60 mL/min (ref >60)

## 2010-11-23 LAB — POCT I-STAT, CHEM 8
Creatinine, Ser: 0.6 mg/dL (ref 0.4–1.2)
HCT: 40 % (ref 36.0–46.0)
Hemoglobin: 13.6 g/dL (ref 12.0–15.0)
Sodium: 137 mEq/L (ref 135–145)
TCO2: 28 mmol/L (ref 0–100)

## 2010-11-23 LAB — URINALYSIS, ROUTINE W REFLEX MICROSCOPIC
Bilirubin Urine: NEGATIVE
Hgb urine dipstick: NEGATIVE
Protein, ur: NEGATIVE mg/dL
Urobilinogen, UA: 1 mg/dL (ref 0.0–1.0)

## 2010-11-23 LAB — PROTIME-INR
INR: 1 (ref 0.00–1.49)
Prothrombin Time: 13.7 seconds (ref 11.6–15.2)

## 2010-11-23 LAB — HEPATITIS PANEL, ACUTE: HCV Ab: NEGATIVE

## 2010-11-23 LAB — ANA: Anti Nuclear Antibody(ANA): NEGATIVE

## 2010-11-23 LAB — PHOSPHORUS
Phosphorus: 1.9 mg/dL — ABNORMAL LOW (ref 2.3–4.6)
Phosphorus: 3.4 mg/dL (ref 2.3–4.6)

## 2010-11-23 LAB — TRIGLYCERIDES: Triglycerides: 175 mg/dL — ABNORMAL HIGH (ref <150)

## 2010-11-23 LAB — PREALBUMIN: Prealbumin: 16 mg/dL — ABNORMAL LOW (ref 18.0–45.0)

## 2010-11-23 LAB — URINE MICROSCOPIC-ADD ON

## 2010-11-23 LAB — MAGNESIUM
Magnesium: 2.1 mg/dL (ref 1.5–2.5)
Magnesium: 2.1 mg/dL (ref 1.5–2.5)
Magnesium: 2.3 mg/dL (ref 1.5–2.5)

## 2010-11-23 LAB — HEPATIC FUNCTION PANEL
Bilirubin, Direct: 0.3 mg/dL (ref 0.0–0.3)
Indirect Bilirubin: 1.3 mg/dL — ABNORMAL HIGH (ref 0.3–0.9)

## 2010-11-23 LAB — MITOCHONDRIAL ANTIBODIES: Mitochondrial M2 Ab, IgG: <20.1 Units (ref <20.1)

## 2010-11-23 LAB — H. PYLORI ANTIBODY, IGG: H Pylori IgG: <0.4 {ISR}

## 2010-12-12 ENCOUNTER — Other Ambulatory Visit: Payer: Self-pay | Admitting: Internal Medicine

## 2010-12-29 ENCOUNTER — Ambulatory Visit (INDEPENDENT_AMBULATORY_CARE_PROVIDER_SITE_OTHER): Payer: Medicare Other | Admitting: Internal Medicine

## 2010-12-29 ENCOUNTER — Encounter: Payer: Self-pay | Admitting: Internal Medicine

## 2010-12-29 DIAGNOSIS — E119 Type 2 diabetes mellitus without complications: Secondary | ICD-10-CM

## 2010-12-29 DIAGNOSIS — R0989 Other specified symptoms and signs involving the circulatory and respiratory systems: Secondary | ICD-10-CM

## 2010-12-29 DIAGNOSIS — I509 Heart failure, unspecified: Secondary | ICD-10-CM

## 2010-12-29 DIAGNOSIS — R0600 Dyspnea, unspecified: Secondary | ICD-10-CM

## 2010-12-29 DIAGNOSIS — I5033 Acute on chronic diastolic (congestive) heart failure: Secondary | ICD-10-CM

## 2010-12-29 DIAGNOSIS — M545 Low back pain: Secondary | ICD-10-CM

## 2010-12-29 DIAGNOSIS — I5032 Chronic diastolic (congestive) heart failure: Secondary | ICD-10-CM

## 2010-12-29 MED ORDER — TRAMADOL-ACETAMINOPHEN 37.5-325 MG PO TABS: 1.0000 | ORAL_TABLET | Freq: Four times a day (QID) | ORAL | Status: DC | PRN

## 2010-12-29 MED ORDER — TRIAMCINOLONE ACETONIDE 0.1 % EX LOTN: TOPICAL_LOTION | Freq: Three times a day (TID) | CUTANEOUS | Status: DC

## 2010-12-29 NOTE — Assessment & Plan Note (Signed)
increased edema; continued PND and ongoing weight gain - Pt reports eval by cards (skeins) who changed lasix to demadex summer 2012 -  also recommended continued restriction of free water intake-  pt cancelled refer for 07/2010 [facility] echo; re order now due to DOE - last Echo on record 04/2009 with LVEF 70% and grade 1 diast dysfx -  refer back to [facility] cards at pt request - no medication changes made today

## 2010-12-29 NOTE — Progress Notes (Signed)
Subjective:    Patient ID: Melissa Myers, female    DOB: May 04, 1945, 65 y.o.   MRN: [id number]  HPI here for follow up -multiple concerns:  Rash distal lower extremities bilaterally. Unresolved with antibiotics over past several months. Associated with itching. No other rash on body.  Dyspnea on exertion. Chronic but worse with increased weight gain and edema. Did not keep appointments for echo or pulmonary evaluation in June 2012 as scheduled. Symptoms worse when laying flat. Not improved with increased diuretics.  Insomnia - exasperate restless legs for which she takes Requip, little relief Falls asleep during daytime, 2 events while driving Feels "smothered" when laying supine  DM2 -variable compliance with prescribed medications; intolerant of metformin due to GI side effects. Does not check sugars. Has been seen by Dr. Sharl Ma but would like to change back to [facility]  Low back pain. Worse with exertion, relieved with sitting or rest. Denies trauma or falls. No radiation of pain. No fever or urinary change. No relief with over-the-counter medications  Past Medical History  Diagnosis Date  . Type II or unspecified type diabetes mellitus without mention of complication, not stated as uncontrolled   . Hyperlipidemia     hx rhabdo on statins  . Hypertension   . CHF (congestive heart failure)     diastolic  . OSA (obstructive sleep apnea)     insignificant apnea events 05/2009 sleep study  . Chronic diarrhea     a/w nausea - felt related to IBS  . RLS (restless legs syndrome)   . Osteoarthritis      Review of Systems  Constitutional: Positive for fatigue and unexpected weight change. Negative for fever.  Respiratory: Positive for shortness of breath. Negative for chest tightness and wheezing.   Genitourinary: Positive for decreased urine volume.  Skin: Positive for rash.       Objective:   Physical Exam  BP 100/62  Pulse 79  Temp(Src) 98.2 F (36.8 C)  (Oral)  Ht 5\' 2"  (1.575 m)  Wt 250 lb (113.399 kg)  BMI 45.73 kg/m2  SpO2 93% Wt Readings from Last 3 Encounters:  12/29/10 250 lb (113.399 kg)  07/26/10 238 lb 8 oz (108.183 kg)  07/21/10 240 lb (108.863 kg)   Constitutional: She is overweight; appears well-developed and well-nourished. No distress. mom at side Neck: Thick; Normal range of motion. Neck supple. No JVD present. No thyromegaly present.  Cardiovascular: Normal rate, regular rhythm and normal heart sounds.  No murmur heard. chronic 2+ BLE edema Pulmonary/Chest: Effort normal and breath sounds diminished at bases bilaterally. No respiratory distress. She has no wheezes.  Skin: chronic erythema of distal BLE  - no pustules on anterior shin or weeping. Evidence for excoriation  Psychiatric: She has a normal mood and affect. Her behavior is normal. Judgment and thought content normal.       Lab Results  Component Value Date   WBC 9.2 07/21/2010   HGB 11.2* 07/21/2010   HCT 33.2* 07/21/2010   PLT 287.0 07/21/2010   CHOL 236 12/02/2009   TRIG 216.0* 11/09/2009   HDL 75 12/02/2009   LDLDIRECT 144.8 11/09/2009   ALT 27 07/21/2010   AST 36 07/21/2010   NA 137 07/26/2010   K 4.5 07/26/2010   CL 99 07/26/2010   CREATININE 1.3* 07/26/2010   BUN 50* 07/26/2010   CO2 28 07/26/2010   TSH 1.35 05/29/2009   INR 1.0 ratio 06/15/2009   HGBA1C 9.0* 05/03/2010     Assessment &  Plan:  See problem list. Medications and labs reviewed today.  Distal rash BLE - not improved with antibiotics - suspect related to chronic edema (venous insuff) - possible eczema - steroid lotion rx for assoc itch symptoms   Hypersomnia/insomnia - no apnea on 05/2009 sleep study - ?med related or other problem -  re-refer for follow up pulm (alva)  (apt 07/2010 cancelled)  LBP, chronic -neuro exam remains intact. Suspect DDD. Vicodin when necessary, avoid NSAIDs due to edema

## 2010-12-29 NOTE — Patient Instructions (Addendum)
It was good to see you today. Ultracet for back pain symptoms, triamcinolone lotion for your leg rash - Your prescription(s) have been submitted to your pharmacy. Please take as directed and contact our office if you believe you are having problem(s) with the medication(s). we'll make referral to Dr. Everardo All, Dr. Freida Busman, Dr. Vassie Loll. Our office will contact you regarding appointment(s) once made. 2-D echocardiogram to evaluate heart. Referral for this test has been made today Please schedule followup in 6-8 weeks to continue to review, call sooner if problems.

## 2010-12-29 NOTE — Assessment & Plan Note (Signed)
Has followed with Sharl Ma, wants to change back to [facility] -  Noncompliant, uncontrolled Lab Results  Component Value Date   HGBA1C 9.0* 05/03/2010

## 2010-12-31 ENCOUNTER — Encounter (HOSPITAL_COMMUNITY): Payer: Self-pay | Admitting: *Deleted

## 2010-12-31 ENCOUNTER — Telehealth: Payer: Self-pay

## 2010-12-31 ENCOUNTER — Emergency Department (HOSPITAL_COMMUNITY)
Admission: EM | Admit: 2010-12-31 | Discharge: 2011-01-01 | Disposition: A | Discharge status: Home / Self Care | Payer: Medicare Other | Attending: Emergency Medicine | Admitting: Emergency Medicine

## 2010-12-31 ENCOUNTER — Emergency Department (HOSPITAL_COMMUNITY): Payer: Medicare Other

## 2010-12-31 DIAGNOSIS — I509 Heart failure, unspecified: Secondary | ICD-10-CM | POA: Insufficient documentation

## 2010-12-31 DIAGNOSIS — G4733 Obstructive sleep apnea (adult) (pediatric): Secondary | ICD-10-CM | POA: Insufficient documentation

## 2010-12-31 DIAGNOSIS — I872 Venous insufficiency (chronic) (peripheral): Secondary | ICD-10-CM

## 2010-12-31 DIAGNOSIS — G2581 Restless legs syndrome: Secondary | ICD-10-CM | POA: Insufficient documentation

## 2010-12-31 DIAGNOSIS — E119 Type 2 diabetes mellitus without complications: Secondary | ICD-10-CM | POA: Insufficient documentation

## 2010-12-31 DIAGNOSIS — I8310 Varicose veins of unspecified lower extremity with inflammation: Secondary | ICD-10-CM | POA: Insufficient documentation

## 2010-12-31 DIAGNOSIS — I10 Essential (primary) hypertension: Secondary | ICD-10-CM | POA: Insufficient documentation

## 2010-12-31 DIAGNOSIS — E785 Hyperlipidemia, unspecified: Secondary | ICD-10-CM | POA: Insufficient documentation

## 2010-12-31 DIAGNOSIS — R609 Edema, unspecified: Secondary | ICD-10-CM

## 2010-12-31 DIAGNOSIS — M7989 Other specified soft tissue disorders: Secondary | ICD-10-CM | POA: Insufficient documentation

## 2010-12-31 DIAGNOSIS — R21 Rash and other nonspecific skin eruption: Secondary | ICD-10-CM | POA: Insufficient documentation

## 2010-12-31 DIAGNOSIS — R197 Diarrhea, unspecified: Secondary | ICD-10-CM | POA: Insufficient documentation

## 2010-12-31 DIAGNOSIS — R0602 Shortness of breath: Secondary | ICD-10-CM | POA: Insufficient documentation

## 2010-12-31 DIAGNOSIS — Z794 Long term (current) use of insulin: Secondary | ICD-10-CM | POA: Insufficient documentation

## 2010-12-31 LAB — BASIC METABOLIC PANEL
BUN: 47 mg/dL — ABNORMAL HIGH (ref 6–23)
Calcium: 10.5 mg/dL (ref 8.4–10.5)
Chloride: 98 mEq/L (ref 96–112)
Creatinine, Ser: 1.52 mg/dL — ABNORMAL HIGH (ref 0.50–1.10)
GFR calc Af Amer: 40 mL/min — ABNORMAL LOW (ref >90)
GFR calc non Af Amer: 35 mL/min — ABNORMAL LOW (ref >90)

## 2010-12-31 LAB — GLUCOSE, CAPILLARY: Glucose-Capillary: 212 mg/dL — ABNORMAL HIGH (ref 70–99)

## 2010-12-31 LAB — PRO B NATRIURETIC PEPTIDE: Pro B Natriuretic peptide (BNP): 171.5 pg/mL — ABNORMAL HIGH (ref 0–125)

## 2010-12-31 LAB — CBC
MCHC: 31.1 g/dL (ref 30.0–36.0)
Platelets: 304 10*3/uL (ref 150–400)
RDW: 15.7 % — ABNORMAL HIGH (ref 11.5–15.5)
WBC: 9.3 10*3/uL (ref 4.0–10.5)

## 2010-12-31 MED ORDER — TECHNETIUM TO 99M ALBUMIN AGGREGATED
6.0000 | Freq: Once | INTRAVENOUS | Status: AC | PRN
  Administered 2010-12-31: 6 via INTRAVENOUS

## 2010-12-31 MED ORDER — XENON XE 133 GAS
10.0000 | GAS_FOR_INHALATION | Freq: Once | RESPIRATORY_TRACT | Status: AC | PRN
  Administered 2010-12-31: 10 via RESPIRATORY_TRACT

## 2010-12-31 NOTE — ED Provider Notes (Signed)
History     CSN: [id number] Arrival date & time: 12/31/2010  2:13 PM   None     Chief Complaint  Patient presents with  . Leg Swelling  . Rash  . Shortness of Breath    (Consider location/radiation/quality/duration/timing/severity/associated sxs/prior treatment) HPI  Patient presents to emergency department with her husband and son at bedside complaining of a multiple month history of persistent lower extremity swelling and multiple month history of shortness of breath stating that she saw her primary care doctor a few days ago about these issues but with ongoing shortness of breath and swelling of lower extremities despite taking her at-home meds as directed and been on antibiotics for associated rash with larger knee swelling. Patient states her last few months she has been sleeping in her recliner or if sleeping in bed and has to pillow orthopnea. Per patient's recent PCP visit, patient did not followup with pulmonary or for her echocardiogram in June of this year as scheduled and therefore she has been rescheduled for echo in the near future. Patient states shortness of breath is aggravated by exertion and by lying flat. Is improved by sitting up. Patient states the lower [place] swelling has been persistent times multiple months with a rash over the last couple months it is itchy and weeks "clear discharge." Patient denies fevers, chills, chest pain, cough, hemoptysis, abdominal pain, nausea, vomiting, diarrhea.   Past Medical History  Diagnosis Date  . Type II or unspecified type diabetes mellitus without mention of complication, not stated as uncontrolled   . Hyperlipidemia     hx rhabdo on statins  . Hypertension   . CHF (congestive heart failure)     diastolic  . OSA (obstructive sleep apnea)     insignificant apnea events 05/2009 sleep study  . Chronic diarrhea     a/w nausea - felt related to IBS  . RLS (restless legs syndrome)   . Osteoarthritis     Past Surgical  History  Procedure Date  . Tubal ligation 1980  . Cholecystectomy 1997  . Tonsillectomy 1970  . Uterine polyp removal 2008  . Umbilical hernia repair 1995    Family History  Problem Relation Age of Onset  . Heart disease Mother 58    MI  . Heart disease Father 27    MI  . Heart disease Other     MI- uncle and multiple aunts (aunts with CVAs)  . Cancer Other     Stomach cancer grandmother    History  Substance Use Topics  . Smoking status: Never Smoker   . Smokeless tobacco: Never Used  . Alcohol Use: No    OB History    Grav Para Term Preterm Abortions TAB SAB Ect Mult Living                  Review of Systems  All other systems reviewed and are negative.    Allergies  Morphine; Sulfa antibiotics; and Sulfonamide derivatives  Home Medications   Current Outpatient Rx  Name Route Sig Dispense Refill  . ASPIRIN 81 MG PO TABS Oral Take 81 mg by mouth daily.     [name] CARVEDILOL 25 MG PO TABS Oral Take 25 mg by mouth 2 (two) times daily with a meal.      . COQ10 30 MG PO CAPS Oral Take 1 capsule by mouth daily.     . FENOFIBRATE 160 MG PO TABS Oral Take 160 mg by mouth daily.      [name]  OMEGA-3 FATTY ACIDS 1000 MG PO CAPS Oral Take 1,000-2,000 mg by mouth daily.      [name] GLUCOSE BLOOD VI STRP Other 1 each by Other route 3 (three) times daily. Use as instructed    . INSULIN DETEMIR 100 UNIT/ML [place] SOLN Subcutaneous Inject 50 Units into the skin at bedtime. 10 mL 12  . INSULIN LISPRO (HUMAN) 100 UNIT/ML [place] SOLN Subcutaneous Inject 11-15 Units into the skin 3 (three) times daily before meals. As directed     . POTASSIUM CHLORIDE CR 10 MEQ PO TBCR Oral Take 10 mEq by mouth 2 (two) times daily.      [name] ROPINIROLE HCL 2 MG PO TABS Oral Take 2 mg by mouth 2 (two) times daily.      . TORSEMIDE 100 MG PO TABS Oral Take 100 mg by mouth daily.     [name] VALSARTAN 160 MG PO TABS Oral Take 160 mg by mouth daily.     [name] VITAMIN D (ERGOCALCIFEROL) PO Oral Take 1 capsule by mouth daily.      [name]  CITALOPRAM HYDROBROMIDE 20 MG PO TABS Oral Take 20 mg by mouth daily.     . TRAMADOL-ACETAMINOPHEN 37.5-325 MG PO TABS Oral Take 1 tablet by mouth every 6 (six) hours as needed. For pain     . TRIAMCINOLONE ACETONIDE 0.1 % EX LOTN Topical Apply 1 application topically 3 (three) times daily.        BP 111/47  Pulse 71  Temp(Src) 97.8 F (36.6 C) (Oral)  Resp 16  SpO2 96%  Physical Exam  Nursing note and vitals reviewed. Constitutional: She is oriented to person, place, and time. She appears well-developed and well-nourished. No distress.  HENT:  Head: Normocephalic and atraumatic.  Eyes: Conjunctivae are normal.  Neck: Normal range of motion. Neck supple.  Cardiovascular: Normal rate, regular rhythm, normal heart sounds and intact distal pulses.  Exam reveals no gallop and no friction rub.   No murmur heard. Pulmonary/Chest: Effort normal and breath sounds normal. No respiratory distress. She has no wheezes. She has no rales. She exhibits no tenderness.  Abdominal: Bowel sounds are normal. She exhibits no distension and no mass. There is no tenderness. There is no rebound and no guarding.  Musculoskeletal: Normal range of motion. She exhibits no edema and no tenderness.  Neurological: She is alert and oriented to person, place, and time.  Skin: Skin is warm and dry. She is not diaphoretic.  Psychiatric: She has a normal mood and affect.       Faint erythema with pinpoint ulcerations and clear discharge diffusely on the lateral lower legs with underlying 1+ pedal edema. No crepitus or signs of cellulitis.    ED Course  Procedures (including critical care time)  Labs Reviewed  PRO B NATRIURETIC PEPTIDE - Abnormal; Notable for the following:    BNP, POC 171.5 (*)    All other components within normal limits  BASIC METABOLIC PANEL - Abnormal; Notable for the following:    Glucose, Bld 238 (*)    BUN 47 (*)    Creatinine, Ser 1.52 (*)    GFR calc non Af Amer 35 (*)    GFR calc Af  Amer 40 (*)    All other components within normal limits  CBC - Abnormal; Notable for the following:    RBC 3.78 (*)    Hemoglobin 10.0 (*)    HCT 32.2 (*)    RDW 15.7 (*)    All other components within  normal limits  GLUCOSE, CAPILLARY - Abnormal; Notable for the following:    Glucose-Capillary 212 (*)    All other components within normal limits  D-DIMER, QUANTITATIVE - Abnormal; Notable for the following:    D-Dimer, Quant 0.73 (*)    All other components within normal limits  POCT CBG MONITORING   Dg Chest 2 View  12/31/2010  *RADIOLOGY REPORT*  Clinical Data: Shortness of breath.  Of bilateral lower extremity edema.  CHEST - 2 VIEW 12/31/2010:  Comparison: Two-view chest x-ray 04/20/2009 [facility] and 01/15/2008 [facility].  Findings: Cardiac silhouette upper normal in size, unchanged. Hilar and mediastinal contours unremarkable.  Lungs clear. Bronchovascular markings normal.  Pulmonary vascularity normal.  No pleural effusions.  Degenerative changes involving the thoracic spine.  No significant interval change.  IMPRESSION: No acute cardiopulmonary disease.  Stable examination.  Original Report Authenticated By: Arnell Sieving, M.D.   9:04 PM Patient is ambulating about the department without difficulty with pulse ox remaining above 95%.  Due to patient's elevated d-dimer and elevated creatinine we will get and VQ scan to rule out pulmonary embolism the patient denies complaints of chest pain or hemoptysis.  11:07 PM Negative VQ scan for PE.   1. Congestive heart failure (CHF)   2. Peripheral edema   3. Stasis dermatitis       MDM  Patient has peripheral edema and signs and symptoms of stasis dermatitis however her chest x-ray does not show any fluid overload and patient is able to angulate with her pulse ox remaining above 95%. Patient has established with a cardiologist in echocardiogram scheduled for the near future by her primary care doctor. At  this time patient is appropriate to followup with her primary care doctor and specialist for further evaluation and management of her orthopnea, shortness of breath, peripheral edema and we will give her dermatology followup for further evaluation and management of stasis dermatitis. There no signs or symptoms associated cellulitis with stasis dermatitis. Patient has a close relationship with her primary care doctor and is agreeable to following up her mild worsening in renal function.        Jenness Corner, [place] 12/31/10 2339

## 2010-12-31 NOTE — ED Provider Notes (Signed)
Medical screening examination/treatment/procedure(s) were conducted as a shared visit with non-physician practitioner(s) and myself.  I personally evaluated the patient during the encounter  Pt with insidious onset orthopnea and persistent LE swelling for months. No resp. Distress. Obese. Legs with weepy edema. Mouth is dry.   Flint Melter, MD 12/31/10 2142

## 2010-12-31 NOTE — ED Notes (Signed)
Pt ambulated with assistant of the walker around the department and to the bathroom just fine. Pulse ox dropped to 95 by came back up quickly. Pt states she feels fine.

## 2010-12-31 NOTE — ED Notes (Signed)
Pt here with swelling to bilateral lower extremities, states that MD told her she has excessive fluid buildup.  States that swelling has been going on for the past few months. Also states that she has been having increasing SOB, states that she has fluid pills and that she has been taking them as prescribed.

## 2010-12-31 NOTE — Telephone Encounter (Signed)
Pt's son called stating the pt increased swelling and pain to her legs and she needs to be "admitted to the hospital by Dr Felicity Coyer". Son is requesting a call back form Dr Felicity Coyer only regarding pt.

## 2010-12-31 NOTE — ED Notes (Signed)
Patient has swelling to bil lower extremities.  She states the sx have been going on for several mths.  She also reports she is having diff breathing.  She has seen her md and was told that she needs to get some fluid off.  She states she is taking her fluid medications

## 2011-01-01 NOTE — ED Provider Notes (Signed)
Medical screening examination/treatment/procedure(s) were performed by non-physician practitioner and as supervising physician I was immediately available for consultation/collaboration.  Flint Melter, MD 01/01/11 [phone]

## 2011-01-03 NOTE — Telephone Encounter (Signed)
Pt has appt scheduled with VAL tomorrow 11/13

## 2011-01-03 NOTE — Telephone Encounter (Signed)
Noted thanks °

## 2011-01-03 NOTE — Telephone Encounter (Signed)
Please let pt (or son) know i received copy of ER evaluation 11/9 (sent home same day) -  please be sure pt has arrangeed ROV with me to continue eval and tx of her concerns - thanks

## 2011-01-04 ENCOUNTER — Encounter: Payer: Self-pay | Admitting: Endocrinology

## 2011-01-04 ENCOUNTER — Encounter: Payer: Self-pay | Admitting: Internal Medicine

## 2011-01-04 ENCOUNTER — Ambulatory Visit (INDEPENDENT_AMBULATORY_CARE_PROVIDER_SITE_OTHER): Payer: Medicare Other | Admitting: Internal Medicine

## 2011-01-04 ENCOUNTER — Ambulatory Visit (INDEPENDENT_AMBULATORY_CARE_PROVIDER_SITE_OTHER): Payer: Medicare Other | Admitting: Endocrinology

## 2011-01-04 VITALS — BP 102/60 | HR 76 | Temp 98.3°F | Wt 246.8 lb

## 2011-01-04 VITALS — BP 104/50 | HR 75 | Temp 98.1°F | Ht 62.0 in | Wt 246.0 lb

## 2011-01-04 DIAGNOSIS — I872 Venous insufficiency (chronic) (peripheral): Secondary | ICD-10-CM

## 2011-01-04 DIAGNOSIS — E119 Type 2 diabetes mellitus without complications: Secondary | ICD-10-CM

## 2011-01-04 DIAGNOSIS — I83019 Varicose veins of right lower extremity with ulcer of unspecified site: Secondary | ICD-10-CM | POA: Insufficient documentation

## 2011-01-04 DIAGNOSIS — I5032 Chronic diastolic (congestive) heart failure: Secondary | ICD-10-CM

## 2011-01-04 DIAGNOSIS — I509 Heart failure, unspecified: Secondary | ICD-10-CM

## 2011-01-04 DIAGNOSIS — Z23 Encounter for immunization: Secondary | ICD-10-CM

## 2011-01-04 DIAGNOSIS — I8310 Varicose veins of unspecified lower extremity with inflammation: Secondary | ICD-10-CM

## 2011-01-04 MED ORDER — FLUTICASONE PROPIONATE 0.005 % EX OINT: TOPICAL_OINTMENT | Freq: Three times a day (TID) | CUTANEOUS | Status: DC | PRN

## 2011-01-04 NOTE — Assessment & Plan Note (Signed)
increased edema; continued PND and ongoing weight gain - Pt reports eval by cards (skeins) who changed lasix to demadex summer 2012 -  also recommended continued restriction of free water intake-  Previously cancelled refer for 07/2010 [facility] echo; scheduled now for November 20 last Echo on record 04/2009 with LVEF 70% and grade 1 diast dysfx -  Followup with cardiology scheduled November 30 with Dr. Shirlee Latch -no medication changes recommended today

## 2011-01-04 NOTE — Progress Notes (Signed)
Subjective:    Patient ID: Melissa Myers, female    DOB: Jan 25, 1946, 65 y.o.   MRN: [id number]  HPI Pt returns here, after living in [place] with her mother for approx 1 year.  She has now moved her mother to live with her.  no cbg record, but states cbg's vary from 150-500.  There is no trend throughout the day.   She has a few mos of severe itching of the legs, and assoc pain.   Past Medical History  Diagnosis Date  . Type II or unspecified type diabetes mellitus without mention of complication, not stated as uncontrolled   . Hyperlipidemia     hx rhabdo on statins  . Hypertension   . CHF (congestive heart failure)     diastolic  . OSA (obstructive sleep apnea)     insignificant apnea events 05/2009 sleep study  . Chronic diarrhea     a/w nausea - felt related to IBS  . RLS (restless legs syndrome)   . Osteoarthritis     Past Surgical History  Procedure Date  . Tubal ligation 1980  . Cholecystectomy 1997  . Tonsillectomy 1970  . Uterine polyp removal 2008  . Umbilical hernia repair 1995    History   Social History  . Marital Status: Married    Spouse Name: N/A    Number of Children: N/A  . Years of Education: N/A   Occupational History  . Not on file.   Social History Main Topics  . Smoking status: Never Smoker   . Smokeless tobacco: Never Used  . Alcohol Use: No  . Drug Use: No  . Sexually Active:    Other Topics Concern  . Not on file   Social History Narrative   Lives with spouse and mother. Retired [employer] operational officer, now housewife    Current Outpatient Prescriptions on File Prior to Visit  Medication Sig Dispense Refill  . aspirin 81 MG tablet Take 81 mg by mouth daily.       . carvedilol (COREG) 25 MG tablet Take 25 mg by mouth 2 (two) times daily with a meal.        . citalopram (CELEXA) 20 MG tablet Take 20 mg by mouth daily.       . Coenzyme Q10 (COQ10) 30 MG CAPS Take 1 capsule by mouth daily.       . fenofibrate 160 MG tablet Take 160 mg by  mouth daily.        . fish oil-omega-3 fatty acids 1000 MG capsule Take 1,000-2,000 mg by mouth daily.        [name] glucose blood (FREESTYLE LITE) test strip 1 each by Other route 3 (three) times daily. Use as instructed      . insulin detemir (LEVEMIR) 100 UNIT/ML injection Inject 50 Units into the skin at bedtime.  10 mL  12  . insulin lispro (HUMALOG) 100 UNIT/ML injection Inject 20 Units into the skin 3 (three) times daily before meals. As directed      . potassium chloride (K-DUR) 10 MEQ tablet Take 10 mEq by mouth 2 (two) times daily.        [name] rOPINIRole (REQUIP) 2 MG tablet Take 2 mg by mouth 2 (two) times daily.        [name] torsemide (DEMADEX) 100 MG tablet Take 100 mg by mouth daily.       . traMADol-acetaminophen (ULTRACET) 37.5-325 MG per tablet Take 1 tablet by mouth every 6 (six) hours as needed. For  pain       . valsartan (DIOVAN) 160 MG tablet Take 160 mg by mouth daily.       [name] VITAMIN D, ERGOCALCIFEROL, PO Take 1 capsule by mouth daily.          Allergies  Allergen Reactions  . Morphine     REACTION: GI upset and headaches  . Sulfa Antibiotics Diarrhea  . Sulfonamide Derivatives     Family History  Problem Relation Age of Onset  . Heart disease Mother 58    MI  . Heart disease Father 17    MI  . Heart disease Other     MI- uncle and multiple aunts (aunts with CVAs)  . Cancer Other     Stomach cancer grandmother    BP 104/50  Pulse 75  Temp(Src) 98.1 F (36.7 C) (Oral)  Ht 5\' 2"  (1.575 m)  Wt 246 lb (111.585 kg)  BMI 44.99 kg/m2  SpO2 94%  Review of Systems denies hypoglycemia    Objective:   Physical Exam VITAL SIGNS:  See vs page GENERAL: no distress.  Obese. Pulses: dorsalis pedis intact bilat, but decreased from normal, possibly due to edema Feet: no deformity.  no ulcer on the feet.  feet are of normal color and temp.  2+ bilat leg edema Neuro: sensation is intact to touch on the feet, but decreased from normal.       Assessment & Plan:  DM, needs  increased rx Rash, needs increased rx

## 2011-01-04 NOTE — Patient Instructions (Addendum)
Increase humalog to 20 units 3x a day (just before each meal). Call if your blood sugar stays high, so we can increase it some more. Same levemir.   Please come back for a follow-up appointment in 3 weeks.   check your blood sugar 2 times a day.  vary the time of day when you check, between before the 3 meals, and at bedtime.  also check if you have symptoms of your blood sugar being too high or too low.  please keep a record of the readings and bring it to your next appointment here.  please call us sooner if your blood sugar goes below 70, or if it stays over 200. i have sent a prescription to your pharmacy, for your legs, to stop the itching.  (update: i left message on phone-tree:  rx as we discussed)

## 2011-01-04 NOTE — Patient Instructions (Signed)
It was good to see you today. We have reviewed your ER visit and tests/medications - nochanges Keep appointments as scheduled for Echo, cardiology, pulmonary and endocrine - we will check on the dermatology referral from the ER as discussed Please keep scheduled followup in Dec as planned to continue to review, call sooner if problems.

## 2011-01-04 NOTE — Assessment & Plan Note (Signed)
Has followed with Sharl Ma, changing to our endocrine Dr. Everardo All today  Noncompliant by history, uncontrolled Defer medication management to endo  Lab Results  Component Value Date   HGBA1C 9.0* 05/03/2010

## 2011-01-04 NOTE — Progress Notes (Signed)
Subjective:    Patient ID: Melissa Myers, female    DOB: Dec 02, 1945, 65 y.o.   MRN: [id number]  HPI  here for follow up -ER visit Friday, November 12 for leg rash and itch Diagnosed with stasis dermatitis and referral to dermatology made Using steroid lotion as prescribed at last office visit here, minimally improved Unchanged edema Reiterated personal commitment to management of her own health, consumed by care of mother and the last 12-18 months and neglecting personal medical needs  Reviewed chronic medical issues:  Rash distal lower extremities bilaterally. Onset Spring 2012, intermittent but gradually worsening. Unresolved with various courses of antibiotics over past several months. Associated with itching. No other rash on body.  Dyspnea on exertion. Chronic but worse with increased weight gain and edema. Did not keep appointments for echo or pulmonary evaluation in June 2012 as scheduled. Symptoms worse when laying flat. Not improved with increased diuretics. CT negative for PE on November 12 in ER  Insomnia - manifest as restless legs for which she takes Requip, little relief Falls asleep during daytime, 2 events while driving Feels "smothered" when laying supine Prior sleep study "did not show apnea" but no followup with sleep or pulmonary in last 2 years  DM2 -variable compliance with prescribed medications; intolerant of metformin due to GI side effects. Does not check sugars. Has been seen by Dr. Sharl Ma but changing back to [facility], appointment this afternoon  Low back pain, chronic. Worse with exertion, relieved with sitting or rest. Denies trauma or falls. No radiation of pain. No fever or urinary change. No relief with over-the-counter medications. Improving with newly prescribed Ultracet.  Past Medical History  Diagnosis Date  . Type II or unspecified type diabetes mellitus without mention of complication, not stated as uncontrolled   . Hyperlipidemia      hx rhabdo on statins  . Hypertension   . CHF (congestive heart failure)     diastolic  . OSA (obstructive sleep apnea)     insignificant apnea events 05/2009 sleep study  . Chronic diarrhea     a/w nausea - felt related to IBS  . RLS (restless legs syndrome)   . Osteoarthritis      Review of Systems  Constitutional: Positive for fatigue and unexpected weight change. Negative for fever.  Respiratory: Positive for shortness of breath. Negative for chest tightness and wheezing.   Genitourinary: Positive for decreased urine volume.  Skin: Positive for rash.       Objective:   Physical Exam  BP 102/60  Pulse 76  Temp(Src) 98.3 F (36.8 C) (Oral)  Wt 246 lb 12.8 oz (111.948 kg)  SpO2 94% Wt Readings from Last 3 Encounters:  01/04/11 246 lb 12.8 oz (111.948 kg)  12/29/10 250 lb (113.399 kg)  07/26/10 238 lb 8 oz (108.183 kg)   Constitutional: She is overweight; appears well-developed and well-nourished. No distress.  Neck: Thick; Normal range of motion. Neck supple. No JVD present. No thyromegaly present.  Cardiovascular: Normal rate, regular rhythm and normal heart sounds.  No murmur heard. chronic tight/woody 2+ BLE edema Pulmonary/Chest: Effort normal and breath sounds diminished at bases bilaterally. No respiratory distress. She has no wheezes.  Skin: chronic thickening and mild erythema of distal BLE, stasis dermatitis without change - no pustules on anterior shin or weeping. Evidence for excoriation  Psychiatric: She has a normal mood and affect. Her behavior is normal. Judgment and thought content normal.       Lab Results  Component  Value Date   WBC 9.3 12/31/2010   HGB 10.0* 12/31/2010   HCT 32.2* 12/31/2010   PLT 304 12/31/2010   CHOL 236 12/02/2009   TRIG 216.0* 11/09/2009   HDL 75 12/02/2009   LDLDIRECT 144.8 11/09/2009   ALT 27 07/21/2010   AST 36 07/21/2010   NA 139 12/31/2010   K 4.2 12/31/2010   CL 98 12/31/2010   CREATININE 1.52* 12/31/2010   BUN 47*  12/31/2010   CO2 27 12/31/2010   TSH 1.35 05/29/2009   INR 1.0 ratio 06/15/2009   HGBA1C 9.0* 05/03/2010     Assessment & Plan:  See problem list. Medications and labs reviewed today. Time spent with pt today 25 minutes, greater than 50% time spent counseling patient on diabetes, diastolic heart failure, rash and medication review. Also review of emergency room records including imaging and labs   Distal rash BLE - not improved with antibiotics - suspect related to chronic edema (venous insuff/stasis dermatitis) - continue steroid lotion rx for assoc itch symptoms - follow up derm as planned  Hypersomnia/insomnia - no apnea on 05/2009 sleep study - ?med related or other problem -  Reminded to keep follow up pulm (alva) scheduled December 17  (apt 07/2010 cancelled)  LBP, chronic -neuro exam remains intact. Suspect DDD. Continue Ultracet when necessary, avoid NSAIDs due to edema

## 2011-01-07 DIAGNOSIS — Z23 Encounter for immunization: Secondary | ICD-10-CM

## 2011-01-10 ENCOUNTER — Ambulatory Visit: Payer: Medicare Other | Admitting: Endocrinology

## 2011-01-11 ENCOUNTER — Ambulatory Visit (HOSPITAL_COMMUNITY): Payer: Medicare Other | Attending: Cardiovascular Disease | Admitting: Radiology

## 2011-01-11 DIAGNOSIS — R0609 Other forms of dyspnea: Secondary | ICD-10-CM | POA: Insufficient documentation

## 2011-01-11 DIAGNOSIS — I5032 Chronic diastolic (congestive) heart failure: Secondary | ICD-10-CM

## 2011-01-11 DIAGNOSIS — E119 Type 2 diabetes mellitus without complications: Secondary | ICD-10-CM | POA: Insufficient documentation

## 2011-01-11 DIAGNOSIS — R0989 Other specified symptoms and signs involving the circulatory and respiratory systems: Secondary | ICD-10-CM | POA: Insufficient documentation

## 2011-01-12 ENCOUNTER — Other Ambulatory Visit: Payer: Self-pay | Admitting: Internal Medicine

## 2011-01-17 ENCOUNTER — Telehealth: Payer: Self-pay

## 2011-01-17 NOTE — Telephone Encounter (Signed)
[facility] [employer] called [facility] triage needing to clarify testing freq.

## 2011-01-18 NOTE — Telephone Encounter (Signed)
Called medicare # that was given is not a working #....01/18/11@8 :41am/LMB

## 2011-01-21 ENCOUNTER — Encounter: Payer: Self-pay | Admitting: Cardiology

## 2011-01-21 ENCOUNTER — Ambulatory Visit (INDEPENDENT_AMBULATORY_CARE_PROVIDER_SITE_OTHER): Payer: Medicare Other | Admitting: Cardiology

## 2011-01-21 VITALS — BP 134/60 | HR 72 | Ht 61.0 in | Wt 242.0 lb

## 2011-01-21 DIAGNOSIS — E785 Hyperlipidemia, unspecified: Secondary | ICD-10-CM

## 2011-01-21 DIAGNOSIS — I509 Heart failure, unspecified: Secondary | ICD-10-CM

## 2011-01-21 DIAGNOSIS — I5032 Chronic diastolic (congestive) heart failure: Secondary | ICD-10-CM

## 2011-01-21 LAB — LDL CHOLESTEROL, DIRECT: Direct LDL: 96.7 mg/dL

## 2011-01-21 LAB — LIPID PANEL
HDL: 66.3 mg/dL (ref >39.00)
Total CHOL/HDL Ratio: 6

## 2011-01-21 LAB — BASIC METABOLIC PANEL
GFR: 16.36 mL/min — ABNORMAL LOW (ref >60.00)
Glucose, Bld: 279 mg/dL — ABNORMAL HIGH (ref 70–99)
Potassium: 4.6 mEq/L (ref 3.5–5.1)
Sodium: 142 mEq/L (ref 135–145)

## 2011-01-21 NOTE — Patient Instructions (Addendum)
Limit your sodium intake to 1500mg  daily.  Limit your fluid intake to 2L or less  a day. This is about 67 ounces a day.  Use graded compression stockings during the day to help the swelling in your feet and ankles. You have the prescription.  Talk with Dr Felicity Coyer about physical therapy for your back.  Your physician recommends that you have a FASTING lipid profile/ BMET 428.32  Your physician recommends that you schedule a follow-up appointment in: 2 months with Dr Shirlee Latch.

## 2011-01-23 NOTE — Progress Notes (Signed)
PCP: Dr. Felicity Coyer  65 yo with history of HTN, DM, hyperlipidemia, and chronic dyspnea returns for cardiac evaluation. Patient had an echo in 3/11 showing moderate LVH and preserved LV systolic function. However, there was a very large LV mid-cavity gradient with valsalva.  Patient developed quite significant exertional dyspnea to the point where she was short of breath walking around her house. She had a pulmonary evaluation with Dr. Vassie Loll but no primary lung problems were identified. Patient had an ETT-myoview which was negative for ischemia or infarction. Right heart cath showed mildly elevated right heart filling pressures but normal PA pressure and normal PCWP. I started her on a beta blocker (Coreg) to try to lower her LV mid-cavity gradient (that likely occurs with exertion) and to better control BP.   Since I last saw her in March, she was started on torsemide to replace Lasix and is now on a rather high dose.  Creatinine is running higher than in the past as well.  She had a recent repeat echo in 11/12 with normal EF.  No significant LV mid-cavity gradient was described.  V/Q scan was negative for PE.  Weight is up 13 lbs since I last saw her but she says it is down 10 lbs since this summer.  She has significant back and knee pain that limits ambulation.  She walks with a cane.  She is limited by pain but does not experience dyspnea when walking on flat ground.  No chest pain.  Patient has chronic orthopnea and sleeps in a recliner often.   Labs (4/11): TGs 539, HDL 58, LDL 128, K 5.2, creatinine 1.3, TSH normal, BNP 14  Labs (5/11): K 3.9, creatinine 1.0  Labs (9/11): creatinine 1.1, BUN 43, LDL 145, HDL 60  Labs (10/11): K 4.3, creatinine 0.81, HCT 34, LDL 115, HDL 75  Labs (11/12): K 4.2, creatinine 1.58, HCT 32.2, BNP 172  ECG: NSR, RBBB  Allergies (verified):  1) ! Sulfa  2) Morphine   Past Medical History:  1. Diabetes mellitus, type II  2. Hyperlipidemia: She has been unable to  tolerate statins (has been on Vytorin, lovastatin, and Crestor) due to what sounds like rhabdo: developed muscle weakness and was told she had "muscle damage" with each of these statins. She has been told not to take statins anymore.  3. Hypertension: prev was told she had "white coat" HTN  4. Obesity  5. Restless Leg Syndrome since age 69yo  6. OA  7. peripheral neuropathy  8. OSA: Mild, does not use CPAP  9. Chronic nausea/diarrhea: ? IBS  10. Diastolic CHF: Echo (3/11) with normal LV size, moderate LVH, EF 65-70%, LV mid-cavity gradient reaching 63 mmHg with valsalva but minimal at rest, grade I diastolic dysfunction, cannot estimate PA systolic pressure (no TR doppler signal), RV normal. RHC (4/11): Mean RA 11, RV 37/13, PA 33/15, mean PCWP 12, CI 3.3. Repeat echo in 11/12 showed mild LVH, EF 65-70%, no LV mid-cavity gradient was mentioned.   11. ETT-myoview (4/11): 5'30", stopped due to fatigue, normal EF, no evidence for scar or ischemia.   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   ROS: All systems reviewed and negative except as per HPI.   Current Outpatient Prescriptions  Medication Sig Dispense Refill  . aspirin 81 MG tablet Take 81 mg by mouth daily.       . carvedilol (COREG) 25 MG tablet Take 25 mg by mouth 2 (two) times daily  with a meal.        . citalopram (CELEXA) 20 MG tablet Take 20 mg by mouth daily.       . Coenzyme Q10 (COQ10) 30 MG CAPS Take 1 capsule by mouth daily.       . fenofibrate 160 MG tablet TAKE 1 TABLET BY MOUTH EVERY DAY  30 tablet  5  . fish oil-omega-3 fatty acids 1000 MG capsule Take 1,000-2,000 mg by mouth daily.        . flyticasone (CUTIVATE) 0.005 % ointment Apply topically 3 (three) times daily as needed. For itching  60 g  2  . glucose blood (FREESTYLE LITE) test strip 1 each by Other route 3 (three) times daily. Use as instructed      . insulin detemir (LEVEMIR) 100 UNIT/ML injection Inject 50 Units into the skin at bedtime.  10 mL  12  . insulin lispro (HUMALOG) 100 UNIT/ML injection Inject 20 Units into the skin 3 (three) times daily before meals. As directed      . potassium chloride (K-DUR) 10 MEQ tablet Take 10 mEq by mouth 2 (two) times daily.        [name] rOPINIRole (REQUIP) 2 MG tablet Take 2 mg by mouth 2 (two) times daily.        [name] torsemide (DEMADEX) 100 MG tablet Take 100 mg by mouth daily.       . valsartan (DIOVAN) 160 MG tablet Take 160 mg by mouth daily.       [name] VITAMIN D, ERGOCALCIFEROL, PO Take 1 capsule by mouth daily.          BP 134/60  Pulse 72  Ht 5\' 1"  (1.549 m)  Wt 109.77 kg (242 lb)  BMI 45.73 kg/m2 General: NAD, obese Neck: JVP 9-10 cm, no thyromegaly or thyroid nodule.  Lungs: Clear to auscultation bilaterally with normal respiratory effort. CV: Nondisplaced PMI.  Heart regular S1/S2, no S3/S4, 2/6 SEM RUSB.  1+ edema to knees bilaterally.  No carotid bruit.  Normal pedal pulses.  Abdomen: Soft, nontender, no hepatosplenomegaly, no distention.  Skin: Venous stasis changes lower legs bilaterally.  Neurologic: Alert and oriented x 3.  Psych: Normal affect. Extremities: No clubbing or cyanosis.

## 2011-01-23 NOTE — Assessment & Plan Note (Signed)
Check lipids today.  Has not been able to tolerate statin, but Niacin would be an option.

## 2011-01-23 NOTE — Assessment & Plan Note (Signed)
Patient has history of diastolic CHF.  She has been noted by echo to have a vigorous LV with mid-cavity significant gradient.  Most recent echo was not described as having mid-cavity gradient, but her murmur on exam is suggestive of this.  She appears volume overloaded on exam.  She is taking 100 mg of torsemide daily.  Her creatinine was elevated when checked earlier this month.  Before increasing her diuretic further, I would like her to try to cut her dietary sodium back to 1.5 g daily and decrease fluid intake (< 2 liters daily).  Her current diet is rather high in salt.  She will wear compression stockings.  I will check a BMET today to see if creatinine has changed any.  She will continue on Coreg for LV mid-cavity gradient (to try to minimize the gradient).    I will see her back in 2 months.

## 2011-01-24 ENCOUNTER — Ambulatory Visit (INDEPENDENT_AMBULATORY_CARE_PROVIDER_SITE_OTHER): Payer: Medicare Other | Admitting: Endocrinology

## 2011-01-24 ENCOUNTER — Encounter: Payer: Self-pay | Admitting: Endocrinology

## 2011-01-24 ENCOUNTER — Other Ambulatory Visit: Payer: Self-pay | Admitting: *Deleted

## 2011-01-24 DIAGNOSIS — I5032 Chronic diastolic (congestive) heart failure: Secondary | ICD-10-CM

## 2011-01-24 DIAGNOSIS — E119 Type 2 diabetes mellitus without complications: Secondary | ICD-10-CM

## 2011-01-24 DIAGNOSIS — E785 Hyperlipidemia, unspecified: Secondary | ICD-10-CM

## 2011-01-24 DIAGNOSIS — I8310 Varicose veins of unspecified lower extremity with inflammation: Secondary | ICD-10-CM

## 2011-01-24 MED ORDER — AMLODIPINE BESYLATE 5 MG PO TABS: 5.0000 mg | ORAL_TABLET | Freq: Every day | ORAL | Status: DC

## 2011-01-24 MED ORDER — OMEGA-3-ACID ETHYL ESTERS 1 G PO CAPS: 2.0000 g | ORAL_CAPSULE | Freq: Two times a day (BID) | ORAL | Status: DC

## 2011-01-24 MED ORDER — INSULIN LISPRO 100 UNIT/ML ~~LOC~~ SOLN: 35.0000 [IU] | Freq: Three times a day (TID) | SUBCUTANEOUS | Status: DC

## 2011-01-24 MED ORDER — FLUTICASONE PROPIONATE 0.005 % EX OINT: TOPICAL_OINTMENT | Freq: Three times a day (TID) | CUTANEOUS | Status: DC | PRN

## 2011-01-24 NOTE — Telephone Encounter (Signed)
Notes Recorded by Jacqlyn Krauss, RN on 01/24/2011 at 9:06 AM I reviewed Dr Alford Highland recommendations with pt. She verbalized understanding. Pt confirmed she is taking fenofibrate. She will return for [facility] 01/27/11. Notes Recorded by Marca Ancona, MD on 01/23/2011 at 11:42 PM 1. Triglycerides are dangerously high, risk of pancreatitis. Make sure she is taking Tricor. She also will need to start Lovaza 2 g bid.  2. Even more worrisomely, BUN and creatinine are very high. She will need to stop valsartan. This can be replaced by amlodipine 5 mg daily. She will need to hold torsemide for 1 week. Watch salt closely and wear compression stockings. Needs repeat BMET in this week (Thursday or Friday) and can restart torsemide at 50 mg daily (1/2 dose) after 1 week if creatinine is coming down

## 2011-01-24 NOTE — Patient Instructions (Addendum)
Increase humalog to 35 units 3x a day (just before each meal). Call if your blood sugar stays high, so we can increase it some more. Same levemir.   Please come back for a follow-up appointment in 2 weeks.   check your blood sugar 2 times a day.  vary the time of day when you check, between before the 3 meals, and at bedtime.  also check if you have symptoms of your blood sugar being too high or too low.  please keep a record of the readings and bring it to your next appointment here.  please call us sooner if your blood sugar goes below 70, or if it stays over 200.   Please try applying the fluticasone more often.

## 2011-01-24 NOTE — Progress Notes (Signed)
Subjective:    Patient ID: Melissa Myers, female    DOB: Apr 05, 1945, 65 y.o.   MRN: [id number]  HPI Pt returns for insulin-requiring DM (2008).  no cbg record, but states cbg's vary from 180-500.  It is in general higher as the day goes on.   Pt states few mos of severe itching at the legs, and assoc swelling. Past Medical History  Diagnosis Date  . Type II or unspecified type diabetes mellitus without mention of complication, not stated as uncontrolled   . Hyperlipidemia     hx rhabdo on statins  . Hypertension   . CHF (congestive heart failure)     diastolic  . OSA (obstructive sleep apnea)     insignificant apnea events 05/2009 sleep study  . Chronic diarrhea     a/w nausea - felt related to IBS  . RLS (restless legs syndrome)   . Osteoarthritis     Past Surgical History  Procedure Date  . Tubal ligation 1980  . Cholecystectomy 1997  . Tonsillectomy 1970  . Uterine polyp removal 2008  . Umbilical hernia repair 1995    History   Social History  . Marital Status: Married    Spouse Name: N/A    Number of Children: N/A  . Years of Education: N/A   Occupational History  . Not on file.   Social History Main Topics  . Smoking status: Never Smoker   . Smokeless tobacco: Never Used  . Alcohol Use: No  . Drug Use: No  . Sexually Active:    Other Topics Concern  . Not on file   Social History Narrative   Lives with spouse and mother. Retired Dipl[employer] Services operational officer, now housewife    Current Outpatient Prescriptions on File Prior to Visit  Medication Sig Dispense Refill  . amLODipine (NORVASC) 5 MG tablet Take 1 tablet (5 mg total) by mouth daily.  30 tablet  6  . aspirin 81 MG tablet Take 81 mg by mouth daily.       . carvedilol (COREG) 25 MG tablet Take 25 mg by mouth 2 (two) times daily with a meal.        . citalopram (CELEXA) 20 MG tablet Take 20 mg by mouth daily.       . Coenzyme Q10 (COQ10) 30 MG CAPS Take 1 capsule by mouth daily.       . fenofibrate 160 MG  tablet TAKE 1 TABLET BY MOUTH EVERY DAY  30 tablet  5  . fish oil-omega-3 fatty acids 1000 MG capsule Take 1,000-2,000 mg by mouth daily.        [name] glucose blood (FREESTYLE LITE) test strip 1 each by Other route 3 (three) times daily. Use as instructed      . insulin detemir (LEVEMIR) 100 UNIT/ML injection Inject 50 Units into the skin at bedtime.  10 mL  12  . omega-3 acid ethyl esters (LOVAZA) 1 G capsule Take 2 capsules (2 g total) by mouth 2 (two) times daily.  120 capsule  6  . potassium chloride (K-DUR) 10 MEQ tablet Take 10 mEq by mouth 2 (two) times daily.        [name] rOPINIRole (REQUIP) 2 MG tablet Take 2 mg by mouth 2 (two) times daily.        [name] VITAMIN D, ERGOCALCIFEROL, PO Take 1 capsule by mouth daily.          Allergies  Allergen Reactions  . Morphine     REACTION: GI upset  and headaches  . Sulfa Antibiotics Diarrhea  . Sulfonamide Derivatives     Family History  Problem Relation Age of Onset  . Heart disease Mother 80    MI  . Heart disease Father 43    MI  . Heart disease Other     MI- uncle and multiple aunts (aunts with CVAs)  . Cancer Other     Stomach cancer grandmother    BP 122/56  Pulse 81  Temp(Src) 98.7 F (37.1 C) (Oral)  Wt 244 lb (110.678 kg)  SpO2 93%  Review of Systems denies hypoglycemia.    Objective:   Physical Exam VITAL SIGNS:  See vs page GENERAL: no distress.  Obese Legs: moderate eczematous rash.   2+ pretib edema.     Assessment & Plan:  Stasis dermatitis, slightly worse DM, needs increased rx

## 2011-01-25 ENCOUNTER — Ambulatory Visit: Payer: Medicare Other | Admitting: Endocrinology

## 2011-01-27 ENCOUNTER — Other Ambulatory Visit: Payer: Medicare Other | Admitting: *Deleted

## 2011-01-28 ENCOUNTER — Ambulatory Visit (INDEPENDENT_AMBULATORY_CARE_PROVIDER_SITE_OTHER): Payer: Medicare Other | Admitting: *Deleted

## 2011-01-28 DIAGNOSIS — I10 Essential (primary) hypertension: Secondary | ICD-10-CM

## 2011-01-28 LAB — BASIC METABOLIC PANEL
BUN: 26 mg/dL — ABNORMAL HIGH (ref 6–23)
Chloride: 107 mEq/L (ref 96–112)
Potassium: 4.9 mEq/L (ref 3.5–5.1)
Sodium: 142 mEq/L (ref 135–145)

## 2011-01-31 ENCOUNTER — Telehealth: Payer: Self-pay | Admitting: Cardiology

## 2011-01-31 DIAGNOSIS — I5032 Chronic diastolic (congestive) heart failure: Secondary | ICD-10-CM

## 2011-01-31 NOTE — Telephone Encounter (Signed)
New Msg: Pt calling wanting to know if MD can put pt back on fluid pill. Pt said the fluid is gathering in pt legs really bad and pt cannot lay day, or hardly walk, pt also c/o not being able to catch breath. Please return pt call to discuss further.

## 2011-01-31 NOTE — Telephone Encounter (Signed)
Addended by: Jacqlyn Krauss on: 01/31/2011 11:15 AM   Modules accepted: Orders

## 2011-01-31 NOTE — Telephone Encounter (Signed)
I talked with pt. Pt states for the last 3-4 days she been more SOB, particularly  with exertion, she has swelling in her feet and ankles, she has gained weight but not sure how much. She is also unable to sleep without sitting up due to SOB. Pt is requesting to restart Torsemide.  She had a BMET done 01/28/11. I will forward to Dr Shirlee Latch for review and recommendations.

## 2011-01-31 NOTE — Telephone Encounter (Signed)
I talked with pt. She did stop valsartan and start amlodipine. She will restart torsemide 100mg  daily for 3 days then decrease to 50mg  daily. She will return for [facility] 02/07/11.

## 2011-01-31 NOTE — Telephone Encounter (Signed)
Make sure she stopped valsartan and started amlodipine.  Can restart torsemide at 100 mg daily x 3 days, then decrease to 50 mg daily after that. Followup in 1 week.

## 2011-02-01 ENCOUNTER — Other Ambulatory Visit: Payer: Self-pay | Admitting: Dermatology

## 2011-02-07 ENCOUNTER — Encounter: Payer: Self-pay | Admitting: Endocrinology

## 2011-02-07 ENCOUNTER — Other Ambulatory Visit: Payer: Medicare Other | Admitting: *Deleted

## 2011-02-07 ENCOUNTER — Ambulatory Visit (INDEPENDENT_AMBULATORY_CARE_PROVIDER_SITE_OTHER): Payer: Medicare Other | Admitting: Pulmonary Disease

## 2011-02-07 ENCOUNTER — Encounter: Payer: Self-pay | Admitting: Pulmonary Disease

## 2011-02-07 ENCOUNTER — Ambulatory Visit (INDEPENDENT_AMBULATORY_CARE_PROVIDER_SITE_OTHER): Payer: Medicare Other | Admitting: Endocrinology

## 2011-02-07 ENCOUNTER — Other Ambulatory Visit: Payer: Medicare Other

## 2011-02-07 VITALS — BP 126/62 | HR 77 | Temp 97.8°F | Ht 62.0 in | Wt 255.4 lb

## 2011-02-07 VITALS — BP 128/60 | HR 70 | Temp 98.4°F | Ht 61.5 in | Wt 256.8 lb

## 2011-02-07 DIAGNOSIS — E119 Type 2 diabetes mellitus without complications: Secondary | ICD-10-CM

## 2011-02-07 DIAGNOSIS — G4730 Sleep apnea, unspecified: Secondary | ICD-10-CM

## 2011-02-07 DIAGNOSIS — I509 Heart failure, unspecified: Secondary | ICD-10-CM

## 2011-02-07 DIAGNOSIS — I5032 Chronic diastolic (congestive) heart failure: Secondary | ICD-10-CM

## 2011-02-07 MED ORDER — GLUCOSE BLOOD VI STRP: 1.0000 | ORAL_STRIP | Freq: Two times a day (BID) | Status: DC

## 2011-02-07 MED ORDER — INSULIN DETEMIR 100 UNIT/ML ~~LOC~~ SOLN: 50.0000 [IU] | Freq: Every day | SUBCUTANEOUS | Status: DC

## 2011-02-07 MED ORDER — INSULIN LISPRO 100 UNIT/ML ~~LOC~~ SOLN: SUBCUTANEOUS | Status: DC

## 2011-02-07 NOTE — Patient Instructions (Addendum)
Increase humalog to 3x a day (just before each meal) 35-35-40 units Same levemir.   Please come back for a follow-up appointment in 1 month. check your blood sugar 2 times a day.  vary the time of day when you check, between before the 3 meals, and at bedtime.  also check if you have symptoms of your blood sugar being too high or too low.  please keep a record of the readings and bring it to your next appointment here.  please call us sooner if your blood sugar goes below 70, or if it stays over 200.

## 2011-02-07 NOTE — Assessment & Plan Note (Addendum)
Given excessive daytime somnolence, narrow pharyngeal exam, witnessed apneas & loud snoring, obstructive sleep apnea is very likely & an overnight polysomnogram will be scheduled as a split study. The pathophysiology of obstructive sleep apnea , it's cardiovascular consequences & modes of treatment including CPAP were discused with the patient in detail & they evidenced understanding. Trial of autoCPAP 5-10 cm with nasal pillows since she has significant orthopnea & PND. If she does not tolerate, we can trial oxygen during sleep Weight loss encouraged, compliance with goal of at least 4-6 hrs every night is the expectation. Advised against medications with sedative side effects Cautioned against driving when sleepy - understanding that sleepiness will vary on a day to day basis

## 2011-02-07 NOTE — Progress Notes (Signed)
  Subjective:    Patient ID: Melissa Myers, female    DOB: 01-16-1946, 65 y.o.   MRN: [id number]  HPI PCP - Felicity Coyer Cards- [facility]  65 yo never smoker with history of HTN, DM - now insulin dependent, hyperlipidemia returns for pulmonary evaluation for chronic dyspnea acccompanied by her mother. Echo in 3/11 showing moderate LVH and preserved LV systolic function. However, there was a very large LV mid-cavity gradient with valsalva.  Patient had an ETT-myoview which was negative for ischemia or infarction. Right heart cath showed mildly elevated right heart filling pressures but normal PA pressure and normal PCWP.She was started on a beta blocker (Coreg) to try to lower her LV mid-cavity gradient (that likely occurs with exertion) and to better control BP. Dyspnea has increased to class 3-4 symptoms. She reports PND & orthopnea & increased bipedal edema. She was on high doses of toresemide & lasix with worsening renal function to 80/3.0 , hence lasix stopped & toresemide cut down to 50 mg in last 2 weeks, but dyspnea has worsened. VQ scan was negative 11/12. PSG 4/11 showed AHI of 6/h , with predominant hypopneas in supine position. Lowest desaturation was 80%.. She has no prior h/oa sthma.    Review of Systems  Constitutional: Negative for fever and unexpected weight change.  HENT: Positive for sore throat. Negative for ear pain, nosebleeds, congestion, rhinorrhea, sneezing, trouble swallowing, dental problem, postnasal drip and sinus pressure.   Eyes: Negative for redness and itching.  Respiratory: Positive for shortness of breath. Negative for cough, chest tightness and wheezing.   Cardiovascular: Negative for palpitations and leg swelling.  Gastrointestinal: Negative for nausea and vomiting.  Genitourinary: Negative for dysuria.  Musculoskeletal: Negative for joint swelling.  Skin: Negative for rash.  Neurological: Negative for headaches.  Hematological: Does not bruise/bleed easily.   Psychiatric/Behavioral: Negative for dysphoric mood. The patient is not nervous/anxious.        Objective:   Physical Exam  Gen. Pleasant, obese, in no distress, normal affect ENT - no lesions, no post nasal drip, class 3 airway Neck: No JVD, no thyromegaly, no carotid bruits Lungs: no use of accessory muscles, no dullness to percussion, clear without rales or rhonchi  Cardiovascular: Rhythm regular, heart sounds  normal, no murmurs or gallops, no peripheral edema Abdomen: soft and non-tender, no hepatosplenomegaly, BS normal. Musculoskeletal: No deformities, no cyanosis or clubbing Neuro:  alert, non focal       Assessment & Plan:

## 2011-02-07 NOTE — Progress Notes (Signed)
Subjective:    Patient ID: Melissa Myers, female    DOB: 1945-07-08, 65 y.o.   MRN: [id number]  HPI Pt returns for insulin-requiring DM (2008).  no cbg record, but states cbg's vary from 100-180.  It is in general higher as the day goes on.  pt states she feels well in general, except for weight gain.   Past Medical History  Diagnosis Date  . Type II or unspecified type diabetes mellitus without mention of complication, not stated as uncontrolled   . Hyperlipidemia     hx rhabdo on statins  . Hypertension   . CHF (congestive heart failure)     diastolic  . OSA (obstructive sleep apnea)     insignificant apnea events 05/2009 sleep study  . Chronic diarrhea     a/w nausea - felt related to IBS  . RLS (restless legs syndrome)   . Osteoarthritis     Past Surgical History  Procedure Date  . Tubal ligation 1980  . Cholecystectomy 1997  . Tonsillectomy 1970  . Uterine polyp removal 2008  . Umbilical hernia repair 1995    History   Social History  . Marital Status: Married    Spouse Name: N/A    Number of Children: N/A  . Years of Education: N/A   Occupational History  . Not on file.   Social History Main Topics  . Smoking status: Never Smoker   . Smokeless tobacco: Never Used  . Alcohol Use: No  . Drug Use: No  . Sexually Active:    Other Topics Concern  . Not on file   Social History Narrative   Lives with spouse and mother. Retired [employer] Services operational officer, now housewife    Current Outpatient Prescriptions on File Prior to Visit  Medication Sig Dispense Refill  . amLODipine (NORVASC) 5 MG tablet Take 1 tablet (5 mg total) by mouth daily.  30 tablet  6  . aspirin 81 MG tablet Take 81 mg by mouth daily.       . carvedilol (COREG) 25 MG tablet Take 25 mg by mouth 2 (two) times daily with a meal.        . citalopram (CELEXA) 20 MG tablet Take 20 mg by mouth daily.       . Coenzyme Q10 (COQ10) 30 MG CAPS Take 1 capsule by mouth daily.       . fenofibrate 160 MG tablet TAKE  1 TABLET BY MOUTH EVERY DAY  30 tablet  5  . fish oil-omega-3 fatty acids 1000 MG capsule Take 1,000-2,000 mg by mouth daily.        . flyticasone (CUTIVATE) 0.005 % ointment Apply topically 3 (three) times daily as needed. For itching  60 g  2  . omega-3 acid ethyl esters (LOVAZA) 1 G capsule Take 2 capsules (2 g total) by mouth 2 (two) times daily.  120 capsule  6  . potassium chloride (K-DUR) 10 MEQ tablet Take 10 mEq by mouth 2 (two) times daily.        [name] rOPINIRole (REQUIP) 2 MG tablet Take 2 mg by mouth 2 (two) times daily.        [name] torsemide (DEMADEX) 100 MG tablet 01/31/11 100mg  daily for 3 days then 50mg  daily      . VITAMIN D, ERGOCALCIFEROL, PO Take 1 capsule by mouth daily.        [name] DISCONTD: glucose blood (FREESTYLE LITE) test strip 1 each by Other route 2 (two) times daily. And lancets 2/day,  250.01        Allergies  Allergen Reactions  . Morphine     REACTION: GI upset and headaches  . Sulfa Antibiotics Diarrhea  . Sulfonamide Derivatives     Family History  Problem Relation Age of Onset  . Heart disease Mother 33    MI  . Heart disease Father 8    MI  . Heart disease Other     MI- uncle and multiple aunts (aunts with CVAs)  . Cancer Other     Stomach cancer grandmother    BP 126/62  Pulse 77  Temp(Src) 97.8 F (36.6 C) (Oral)  Ht 5\' 2"  (1.575 m)  Wt 255 lb 6.4 oz (115.849 kg)  BMI 46.71 kg/m2  SpO2 96%  Review of Systems denies hypoglycemia    Objective:   Physical Exam VITAL SIGNS:  See vs page GENERAL: no distress.  Morbid obesity.   Ext: trace bilat leg edema.     Assessment & Plan:  DM, Needs increased rx, if it can be done with a regimen that avoids or minimizes hypoglycemia.

## 2011-02-07 NOTE — Patient Instructions (Signed)
Increase toresemide to 100 mg until you see Dr Shirlee Latch We will set you up with a CPAP machine to trial - hopefully this will help your breathing too.

## 2011-02-07 NOTE — Progress Notes (Signed)
  Subjective:    Patient ID: Melissa Myers, female    DOB: 10-Sep-1945, 65 y.o.   MRN: [id number]  HPI PCP - Felicity Coyer 65/F never smoker, diabetic , hypertensive for FU evaluation of dyspnea.  She reports dyspnea on exertion x 2 year . She reprots pedal edema x few weeks & sleeps in a recliner ? orthopnea. Mother has noted loud snoring but no witnessed apneas or choking/ gasping episodes. She uses an albuterol inhaler as needed & reports wheezing x last 3 years , no childhood h/o asthma.  She has restless legs x 10 years & requip makes her sleepy. Epworth Sleepiness Score is reported as 24 (but she states this is due to requip). bedtime is midnight, sleep latenyc is variable & sometimes she takes 5 mg ambien (1/2 tab) BNP 23, nml renal function, TSH nml  CXR 2/28 wnl    02/07/2011 FU Persistent dyspnea Gained from 244 to 257 in 2 weeks echo in 3/11 showing moderate LVH and preserved LV systolic function. However, there was a very large LV mid-cavity gradient with valsalva. Patient developed quite significant exertional dyspnea to the point where she was short of breath walking around her house.  ETT-myoview  was negative for ischemia or infarction. Right heart cath showed mildly elevated right heart filling pressures but normal PA pressure and normal PCWP. She was started on torsemide to replace Lasix, on high doses BUN cr rose to 83/3.0 , hence cut down to 50 mg .Repeat echo in 11/12 with normal EF. No significant LV mid-cavity gradient was described. V/Q scan was negative for PE 11/12 Reviewed PSG (on requip & ambien)>> mild obstructive sleep apnea , predominnat hypopneas in supine position, lowest destauration 80%, < 1 min with satn < 88%. TST 350 mins, 39 mins of REM. No PLMs      Review of Systems Patient denies significant dyspnea,cough, hemoptysis,  chest pain, palpitations, pedal edema, orthopnea, paroxysmal nocturnal dyspnea, lightheadedness, nausea, vomiting, abdominal or   leg pains      Objective:   Physical Exam        Assessment & Plan:

## 2011-02-08 ENCOUNTER — Telehealth: Payer: Self-pay | Admitting: *Deleted

## 2011-02-08 ENCOUNTER — Other Ambulatory Visit (INDEPENDENT_AMBULATORY_CARE_PROVIDER_SITE_OTHER): Payer: Medicare Other | Admitting: *Deleted

## 2011-02-08 DIAGNOSIS — I509 Heart failure, unspecified: Secondary | ICD-10-CM

## 2011-02-08 DIAGNOSIS — I5032 Chronic diastolic (congestive) heart failure: Secondary | ICD-10-CM

## 2011-02-08 LAB — BASIC METABOLIC PANEL
CO2: 32 mEq/L (ref 19–32)
Chloride: 99 mEq/L (ref 96–112)
Glucose, Bld: 131 mg/dL — ABNORMAL HIGH (ref 70–99)
Potassium: 3.4 mEq/L — ABNORMAL LOW (ref 3.5–5.1)
Sodium: 143 mEq/L (ref 135–145)

## 2011-02-08 NOTE — Telephone Encounter (Signed)
Notified pt of lab results and instructed pt to take of K+ today and the of K+ for 1 week.  Pt verbalized understanding of instructions.  Waymon Budge, LPN

## 2011-02-09 ENCOUNTER — Encounter: Payer: Self-pay | Admitting: Internal Medicine

## 2011-02-09 ENCOUNTER — Encounter: Payer: Self-pay | Admitting: Cardiology

## 2011-02-09 ENCOUNTER — Ambulatory Visit (INDEPENDENT_AMBULATORY_CARE_PROVIDER_SITE_OTHER): Payer: Medicare Other | Admitting: Cardiology

## 2011-02-09 ENCOUNTER — Ambulatory Visit (INDEPENDENT_AMBULATORY_CARE_PROVIDER_SITE_OTHER): Payer: Medicare Other | Admitting: Internal Medicine

## 2011-02-09 VITALS — BP 146/68 | HR 78 | Ht 61.5 in | Wt 246.4 lb

## 2011-02-09 DIAGNOSIS — I10 Essential (primary) hypertension: Secondary | ICD-10-CM

## 2011-02-09 DIAGNOSIS — E119 Type 2 diabetes mellitus without complications: Secondary | ICD-10-CM

## 2011-02-09 DIAGNOSIS — I509 Heart failure, unspecified: Secondary | ICD-10-CM

## 2011-02-09 DIAGNOSIS — I5032 Chronic diastolic (congestive) heart failure: Secondary | ICD-10-CM

## 2011-02-09 DIAGNOSIS — N259 Disorder resulting from impaired renal tubular function, unspecified: Secondary | ICD-10-CM

## 2011-02-09 DIAGNOSIS — I8310 Varicose veins of unspecified lower extremity with inflammation: Secondary | ICD-10-CM

## 2011-02-09 DIAGNOSIS — E785 Hyperlipidemia, unspecified: Secondary | ICD-10-CM

## 2011-02-09 DIAGNOSIS — M545 Low back pain: Secondary | ICD-10-CM

## 2011-02-09 DIAGNOSIS — I872 Venous insufficiency (chronic) (peripheral): Secondary | ICD-10-CM

## 2011-02-09 MED ORDER — TORSEMIDE 20 MG PO TABS: ORAL_TABLET | ORAL | Status: DC

## 2011-02-09 MED ORDER — POTASSIUM CHLORIDE 10 MEQ PO TBCR: EXTENDED_RELEASE_TABLET | ORAL | Status: DC

## 2011-02-09 NOTE — Assessment & Plan Note (Signed)
I discussed with dr Shirlee Latch - who will see her in 2 days Increase toresemide to 100 mg again - BMET will be rechecked. This appears to be the main cause of her dyspnea. I doubt that she has any lung pathology - she does not seem to have reactive airway disease - bronchodilators would not be of benefit here. No ILD or PE noted, no pulm htn on cath

## 2011-02-09 NOTE — Assessment & Plan Note (Signed)
BP is mildly elevated.  I am not going to change her amlodipine today.

## 2011-02-09 NOTE — Assessment & Plan Note (Signed)
Following with cardiology for same, volume status complicated by renal insufficiency and chronic venous insufficiency Recent adjustments in diuretics reviewed Per cardiology, on Coreg to decrease of the mid cavity gradient Medications reviewed, no changes by me today

## 2011-02-09 NOTE — Progress Notes (Addendum)
PCP: Dr. Felicity Coyer  65 yo with history of HTN, DM, hyperlipidemia, and chronic dyspnea returns for cardiac evaluation. Patient had an echo in 3/11 showing moderate LVH and preserved LV systolic function. However, there was a very large LV mid-cavity gradient with valsalva.  Patient developed quite significant exertional dyspnea to the point where she was short of breath walking around her house. She had a pulmonary evaluation with Dr. Vassie Loll but no primary lung problems were identified. Patient had an ETT-myoview which was negative for ischemia or infarction. Right heart cath showed mildly elevated right heart filling pressures but normal PA pressure and normal PCWP. I started her on a beta blocker (Coreg) to try to lower her LV mid-cavity gradient (that likely occurs with exertion) and to better control BP.   Over the summer, she was started on torsemide by another cardiologist to replace Lasix and was on 100 mg daily.  She had a recent repeat echo in 11/12 with normal EF.  No significant LV mid-cavity gradient was described.  V/Q scan was negative for PE.  When I saw her last in 11/12, I checked her creatinine and found it to be 3 with a BUN of 83.  I stopped valsartan and put her on amlodipine instead and I stopped her torsemide for a few days, later cutting the dose in half to 50 mg daily.  Creatinine returned to 1.2 but she gained weight and developed shortness of breath.  She is now taking torsemide 100 mg daily now and is feeling better.    She has significant back and knee pain that limits ambulation.  She walks with a cane.  She is limited by pain but after increasing torsemide back to 100 mg daily does not experience dyspnea when walking on flat ground.  Weight is up 4 lbs since I saw her last.  No chest pain.  Patient has chronic orthopnea and is currently sleeping on 4 pillows.  She has mild OSA and is going to be starting CPAP.   Triglycerides were extremely high when recently checked.  She was  already on Tricor and has been started on Lovaza.   ECG: NSR, rightward axis.   Labs (4/11): TGs 539, HDL 58, LDL 128, K 5.2, creatinine 1.3, TSH normal, BNP 14  Labs (5/11): K 3.9, creatinine 1.0  Labs (9/11): creatinine 1.1, BUN 43, LDL 145, HDL 60  Labs (10/11): K 4.3, creatinine 0.81, HCT 34, LDL 115, HDL 75  Labs (11/12): K 4.2, creatinine 1.58 => 3.0, HCT 32.2, BNP 172, LDL 97, TGs 980 Labs (12/12): K 3.4, creatinine 1.2 => 1.7  Allergies (verified):  1) ! Sulfa  2) Morphine   Past Medical History:  1. Diabetes mellitus, type II  2. Hyperlipidemia: She has been unable to tolerate statins (has been on Vytorin, lovastatin, and Crestor) due to what sounds like rhabdo: developed muscle weakness and was told she had "muscle damage" with each of these statins. She has been told not to take statins anymore.  3. Hypertension: prev was told she had "white coat" HTN  4. Obesity  5. Restless Leg Syndrome since age 80yo  6. OA  7. peripheral neuropathy  8. OSA: Mild, does not use CPAP  9. Chronic nausea/diarrhea: ? IBS  10. Diastolic CHF: Echo (3/11) with normal LV size, moderate LVH, EF 65-70%, LV mid-cavity gradient reaching 63 mmHg with valsalva but minimal at rest, grade I diastolic dysfunction, cannot estimate PA systolic pressure (no TR doppler signal), RV normal. RHC (4/11):  Mean RA 11, RV 37/13, PA 33/15, mean PCWP 12, CI 3.3. Repeat echo in 11/12 showed mild LVH, EF 65-70%, no LV mid-cavity gradient was mentioned.   11. ETT-myoview (4/11): 5'30", stopped due to fatigue, normal EF, no evidence for scar or ischemia.   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   ROS: All systems reviewed and negative except as per HPI.   Current Outpatient Prescriptions  Medication Sig Dispense Refill  . amLODipine (NORVASC) 5 MG tablet Take 1 tablet (5 mg total) by mouth daily.  30 tablet  6  . aspirin 81 MG tablet Take 81 mg by mouth daily.       . carvedilol (COREG) 25 MG tablet Take 25 mg by mouth 2 (two) times daily with a meal.        . citalopram (CELEXA) 20 MG tablet Take 20 mg by mouth daily.       . Coenzyme Q10 (COQ10) 30 MG CAPS Take 1 capsule by mouth daily.       . fenofibrate 160 MG tablet TAKE 1 TABLET BY MOUTH EVERY DAY  30 tablet  5  . fish oil-omega-3 fatty acids 1000 MG capsule Take 1,000-2,000 mg by mouth daily.        . flyticasone (CUTIVATE) 0.005 % ointment Apply topically 3 (three) times daily as needed. For itching  60 g  2  . glucose blood (FREESTYLE LITE) test strip 1 each by Other route 2 (two) times daily. And lancets 2/day, 250.01  100 each  11  . insulin detemir (LEVEMIR FLEXPEN) 100 UNIT/ML injection Inject 50 Units into the skin at bedtime.  15 mL  12  . insulin lispro (HUMALOG KWIKPEN) 100 UNIT/ML injection 3x a day (just before each meal) 35-35-40 units, and pen needles 4/day  45 mL  12  . rOPINIRole (REQUIP) 2 MG tablet Take 2 mg by mouth 2 (two) times daily.        [name] VITAMIN D, ERGOCALCIFEROL, PO Take 1 capsule by mouth daily.        [name] DISCONTD: omega-3 acid ethyl esters (LOVAZA) 1 G capsule Take 2 capsules (2 g total) by mouth 2 (two) times daily.  120 capsule  6  . omega-3 acid ethyl esters (LOVAZA) 1 G capsule Take 2 capsules (2 g total) by mouth 2 (two) times daily.      . potassium chloride  (KLOR-CON 10) 10 MEQ CR tablet Take 3 tablets every morning  90 tablet  11  . torsemide (DEMADEX) 20 MG tablet Take 4 tablets every morning  120 tablet  11  . DISCONTD: potassium chloride (K-DUR) 10 MEQ tablet Take 10 mEq by mouth 2 (two) times daily.          BP 146/68  Pulse 78  Ht 5' 1.5" (1.562 m)  Wt 111.766 kg (  246 lb 6.4 oz)  BMI 45.80 kg/m2 General: NAD, obese Neck: Thick, JVP 8-9 cm, no thyromegaly or thyroid nodule.  Lungs: Clear to auscultation bilaterally with normal respiratory effort. CV: Nondisplaced PMI.  Heart regular S1/S2, no S3/S4, 2/6 SEM RUSB.  2+ chronic edema to knees bilaterally.  No carotid bruit.  Abdomen: Soft, nontender, no hepatosplenomegaly, no distention.  Skin: Venous stasis changes lower legs bilaterally.  Neurologic: Alert and oriented x 3.  Psych: Normal affect. Extremities: No clubbing or cyanosis.

## 2011-02-09 NOTE — Patient Instructions (Signed)
It was good to see you today. We have reviewed your recent medical visits, weights and tests/medications - Medications reviewed, no changes at this time. we'll make referral to physical therapy for your back and knee. Our office will contact you regarding appointment(s) once made. Please schedule followup in 3-4 months, call sooner if problems.

## 2011-02-09 NOTE — Progress Notes (Signed)
Subjective:    Patient ID: Melissa Myers, female    DOB: 12-17-1945, 65 y.o.   MRN: [id number]  HPI  here for follow up - Reiterated personal commitment to management of her own health, consumed by care of mother and the last 12-18 months and neglecting personal medical needs  Reviewed chronic medical issues:  Rash distal lower extremities bilaterally. Stasis dermatitis -Onset Spring 2012, intermittent but gradually worsening. Unresolved with various courses of antibiotics over past several months. Associated with itching. No other rash on body.  Dyspnea on exertion. Chronic but worse with increased weight gain and edema. Did not keep appointments for echo or pulmonary evaluation in June 2012 as scheduled. Symptoms worse when laying flat. Not improved with increased diuretics. CT negative for PE on January 03, 2011 in ER  Insomnia - manifest as restless legs for which she takes Requip, little relief Falls asleep during daytime, 2 events while driving Feels "smothered" when laying supine Prior sleep study "did not show apnea" but followup evaluation December 2012 feels sleep apnea highly probable based on body habitus and arranging for home CPAP split study  DM2 -variable compliance with prescribed medications; intolerant of metformin due to GI side effects. Does not check sugars. Has been seen by Dr. Sharl Ma but changed back to [facility] endocrinology November 2012  Low back pain, chronic. Worse with exertion, relieved with sitting or rest. Denies trauma or falls. No radiation of pain. No fever or urinary change. No relief with over-the-counter medications. Improving with newly prescribed Ultracet.  Past Medical History  Diagnosis Date  . Type II or unspecified type diabetes mellitus without mention of complication, not stated as uncontrolled   . Hyperlipidemia     hx rhabdo on statins  . Hypertension   . CHF (congestive heart failure)     diastolic  . OSA (obstructive sleep apnea)      insignificant apnea events 05/2009 sleep study  . Chronic diarrhea     a/w nausea - felt related to IBS  . RLS (restless legs syndrome)   . Osteoarthritis      Review of Systems  Constitutional: Positive for fatigue and unexpected weight change. Negative for fever.  Respiratory: Positive for shortness of breath. Negative for chest tightness and wheezing.   Genitourinary: Positive for decreased urine volume.  Skin: Positive for rash.       Objective:   Physical Exam  BP 132/60  Pulse 87  Temp(Src) 98.6 F (37 C) (Oral)  Wt 248 lb (112.492 kg)  SpO2 93% Wt Readings from Last 3 Encounters:  02/09/11 248 lb (112.492 kg)  02/09/11 246 lb 6.4 oz (111.766 kg)  02/07/11 256 lb 12.8 oz (116.484 kg)   Constitutional: She is overweight; appears well-developed and well-nourished. No distress. mom at side Neck: Thick; Normal range of motion. Neck supple. No JVD present. No thyromegaly present.  Cardiovascular: Normal rate, regular rhythm and normal heart sounds.  No murmur heard. chronic tight/woody 2+ BLE edema Pulmonary/Chest: Effort normal and breath sounds diminished at bases bilaterally. No respiratory distress. She has no wheezes.  Skin: chronic thickening and mild erythema of distal BLE, stasis dermatitis without change - no pustules on anterior shin or weeping. Evidence for excoriation  Psychiatric: She has a normal mood and affect. Her behavior is normal. Judgment and thought content normal.       Lab Results  Component Value Date   WBC 9.3 12/31/2010   HGB 10.0* 12/31/2010   HCT 32.2* 12/31/2010   PLT  304 12/31/2010   CHOL 407* 01/21/2011   TRIG 980.0* 01/21/2011   HDL 66.30 01/21/2011   LDLDIRECT 96.7 01/21/2011   ALT 27 07/21/2010   AST 36 07/21/2010   NA 143 02/08/2011   K 3.4* 02/08/2011   CL 99 02/08/2011   CREATININE 1.7* 02/08/2011   BUN 30* 02/08/2011   CO2 32 02/08/2011   TSH 1.35 05/29/2009   INR 1.0 ratio 06/15/2009   HGBA1C 9.0* 05/03/2010      Assessment & Plan:  See problem list. Medications and labs reviewed today.  Distal rash BLE, venous stasis and stasis dermatitis - suspect related to chronic edema (venous insuff/stasis dermatitis) - continue steroid lotion rx for assoc itch symptoms - follow up derm as ongoing  Hypersomnia/insomnia - ?OSA - only mild apnea on 05/2009 sleep study but high suspicion of same - recent pulm (alva) eval reviewed and plans for home split study and CPAP  LBP, chronic -neuro exam remains intact. Suspect DDD. Continue Ultracet when necessary, avoid NSAIDs due to edema - refer to PT

## 2011-02-09 NOTE — Patient Instructions (Signed)
Your physician recommends that you schedule a follow-up appointment in: 2 months  Your physician recommends that you return for lab work in: 02/24/11 you may eat BMET  Your physician recommends that you return for lab work in: 03/27/11 nothing to eat or drink after midnight Lipid Profile

## 2011-02-09 NOTE — Assessment & Plan Note (Signed)
Chronic pain symptoms, related to DDD and strain Continue tramadol as reports intolerance to stronger narcotics Refer to physical therapy and encouraged efforts at weight loss and increased mobility as previously reviewed for other health reasons

## 2011-02-09 NOTE — Assessment & Plan Note (Signed)
BP Readings from Last 3 Encounters:  02/09/11 132/60  02/09/11 146/68  02/07/11 128/60   The current medical regimen is generally effective;  continue present plan and medications.

## 2011-02-09 NOTE — Assessment & Plan Note (Signed)
Dangerously high triglycerides when last checked (risk of pancreatitis).  She was on Tricor and has started Lovaza 2 g bid also.  Will get lipids in 2 months.

## 2011-02-09 NOTE — Assessment & Plan Note (Signed)
Weight is up a bit since cutting back on torsemide.  She was restarted on 100 mg torsemide and breathing is back to baseline.  I am worried about her renal function on this dose of torsemide.  She does appear volume overloaded, but I think that some of her peripheral edema is due venous insufficiency rather than CHF.  She is on Coreg to try lessen her LV mid-cavity gradient.  No significant gradient was described on her recent echo but she does have a systolic murmur.  I suspect that she still has a significant gradient present.  - Continue Coreg 25 bid.  - Decrease torsemide to 80 mg daily.  Will give her 20 mg tablets so dose can be easily adjusted.  - KCl 30 mEq daily.  - Will need BMET in 1 week.  - I will see her in 2 months.  - She needs to wear compression stockings, to get fitted soon.

## 2011-02-09 NOTE — Assessment & Plan Note (Signed)
Need to follow creatinine carefully with diuresis.  As above, BMET in 1 week.  She is also off valsartan and taking amlodipine instead.

## 2011-02-24 ENCOUNTER — Other Ambulatory Visit: Payer: Medicare Other | Admitting: *Deleted

## 2011-02-28 ENCOUNTER — Ambulatory Visit (INDEPENDENT_AMBULATORY_CARE_PROVIDER_SITE_OTHER): Payer: Medicare Other | Admitting: *Deleted

## 2011-02-28 DIAGNOSIS — I509 Heart failure, unspecified: Secondary | ICD-10-CM

## 2011-02-28 DIAGNOSIS — M25569 Pain in unspecified knee: Secondary | ICD-10-CM | POA: Diagnosis not present

## 2011-02-28 DIAGNOSIS — M1710 Unilateral primary osteoarthritis, unspecified knee: Secondary | ICD-10-CM | POA: Diagnosis not present

## 2011-02-28 DIAGNOSIS — I5032 Chronic diastolic (congestive) heart failure: Secondary | ICD-10-CM | POA: Diagnosis not present

## 2011-02-28 LAB — LIPID PANEL
Total CHOL/HDL Ratio: 5
VLDL: 56.2 mg/dL — ABNORMAL HIGH (ref 0.0–40.0)

## 2011-02-28 LAB — BASIC METABOLIC PANEL
BUN: 24 mg/dL — ABNORMAL HIGH (ref 6–23)
CO2: 34 mEq/L — ABNORMAL HIGH (ref 19–32)
Chloride: 102 mEq/L (ref 96–112)
Creatinine, Ser: 1.1 mg/dL (ref 0.4–1.2)

## 2011-02-28 LAB — LDL CHOLESTEROL, DIRECT: Direct LDL: 120.9 mg/dL

## 2011-02-28 LAB — SEDIMENTATION RATE: Sed Rate: 63 mm/hr — ABNORMAL HIGH (ref 0–22)

## 2011-03-01 LAB — ANA: Anti Nuclear Antibody(ANA): NEGATIVE

## 2011-03-10 ENCOUNTER — Ambulatory Visit: Payer: Medicare Other | Admitting: Endocrinology

## 2011-03-10 DIAGNOSIS — Z0289 Encounter for other administrative examinations: Secondary | ICD-10-CM

## 2011-03-11 ENCOUNTER — Telehealth: Payer: Self-pay | Admitting: Internal Medicine

## 2011-03-11 NOTE — Telephone Encounter (Signed)
meds reviewed, not on ACE or other more typical to cause rash;  To cont same meds, benadryl 50 mg q 6 hrs prn, and OV or UC (?or sat clinic tomorrow) if no other symtpoms such as swelling (tongue or anything else), sob or wheezing - o/w should go to ER;  If fever should go to UC or ER now

## 2011-03-11 NOTE — Telephone Encounter (Signed)
Called left message to call back 

## 2011-03-11 NOTE — Telephone Encounter (Signed)
Called the patient informed of MD's recommendation. She informed she is having some SOB  And wheezing. Informed to go to the ER and the patient stated she would do so.

## 2011-03-11 NOTE — Telephone Encounter (Signed)
The pt called and states she has a rash all over and needs advice on what to do.  She was advised to come in for an apt to be seen, but stated she does not want to come in for an apt due to the snow, so she is hoping for advice on how to help her rash.    Thanks!!

## 2011-03-17 ENCOUNTER — Ambulatory Visit: Payer: Medicare Other | Admitting: Endocrinology

## 2011-03-21 ENCOUNTER — Other Ambulatory Visit: Payer: Medicare Other | Admitting: *Deleted

## 2011-03-23 ENCOUNTER — Ambulatory Visit: Payer: Medicare Other | Admitting: Cardiology

## 2011-03-28 ENCOUNTER — Other Ambulatory Visit: Payer: Medicare Other | Admitting: *Deleted

## 2011-03-28 ENCOUNTER — Telehealth: Payer: Self-pay | Admitting: *Deleted

## 2011-03-28 DIAGNOSIS — I872 Venous insufficiency (chronic) (peripheral): Secondary | ICD-10-CM

## 2011-03-28 DIAGNOSIS — L299 Pruritus, unspecified: Secondary | ICD-10-CM

## 2011-03-28 NOTE — Telephone Encounter (Signed)
Pt states md is ware of sores & itching sxs on her legs. Leg is not getting better. Pt states she call [facility] and they are needing a referral from md. Have to call [facility] referral line @ [phone] to get her set-up up... 03/28/11@10 :12am/LMB

## 2011-03-28 NOTE — Telephone Encounter (Signed)
Notified pt md ok referral will received call from [facility] with appt, date,and time... 03/27/1321:54pm/LMB

## 2011-03-28 NOTE — Telephone Encounter (Signed)
Will refer to [facility] derm at pt request - thanks

## 2011-04-05 ENCOUNTER — Encounter: Payer: Self-pay | Admitting: Endocrinology

## 2011-04-05 ENCOUNTER — Ambulatory Visit (INDEPENDENT_AMBULATORY_CARE_PROVIDER_SITE_OTHER): Payer: Medicare Other | Admitting: Endocrinology

## 2011-04-05 VITALS — BP 124/68 | HR 81 | Temp 97.7°F | Ht 62.0 in | Wt 251.8 lb

## 2011-04-05 DIAGNOSIS — N058 Unspecified nephritic syndrome with other morphologic changes: Secondary | ICD-10-CM | POA: Diagnosis not present

## 2011-04-05 DIAGNOSIS — E1029 Type 1 diabetes mellitus with other diabetic kidney complication: Secondary | ICD-10-CM

## 2011-04-05 DIAGNOSIS — E119 Type 2 diabetes mellitus without complications: Secondary | ICD-10-CM

## 2011-04-05 DIAGNOSIS — E1065 Type 1 diabetes mellitus with hyperglycemia: Secondary | ICD-10-CM

## 2011-04-05 MED ORDER — INSULIN NPH (HUMAN) (ISOPHANE) 100 UNIT/ML ~~LOC~~ SUSP: 50.0000 [IU] | Freq: Every day | SUBCUTANEOUS | Status: DC

## 2011-04-05 NOTE — Progress Notes (Signed)
Subjective:    Patient ID: Melissa Myers, female    DOB: 1946/01/10, 66 y.o.   MRN: [id number]  HPI Pt returns for insulin-requiring DM (2008).  She has few weeks of moderate itching throughout the body, and assoc rash.  She stopped the levemir.  Rash is improved now.  no cbg record, but states cbg's are 150-220.  She wants to stop all insulin, because injections are painful.   Past Medical History  Diagnosis Date  . Type II or unspecified type diabetes mellitus without mention of complication, not stated as uncontrolled   . Hyperlipidemia     hx rhabdo on statins  . Hypertension   . CHF (congestive heart failure)     diastolic  . OSA (obstructive sleep apnea)     insignificant apnea events 05/2009 sleep study  . Chronic diarrhea     a/w nausea - felt related to IBS  . RLS (restless legs syndrome)   . Osteoarthritis   . Stasis dermatitis     Past Surgical History  Procedure Date  . Tubal ligation 1980  . Cholecystectomy 1997  . Tonsillectomy 1970  . Uterine polyp removal 2008  . Umbilical hernia repair 1995    History   Social History  . Marital Status: Married    Spouse Name: N/A    Number of Children: N/A  . Years of Education: N/A   Occupational History  . Not on file.   Social History Main Topics  . Smoking status: Never Smoker   . Smokeless tobacco: Never Used  . Alcohol Use: No  . Drug Use: No  . Sexually Active: Not on file   Other Topics Concern  . Not on file   Social History Narrative   Lives with spouse and mother. Retired [employer] Services operational officer, now housewife    Current Outpatient Prescriptions on File Prior to Visit  Medication Sig Dispense Refill  . amLODipine (NORVASC) 5 MG tablet Take 1 tablet (5 mg total) by mouth daily.  30 tablet  6  . aspirin 81 MG tablet Take 81 mg by mouth daily.       . carvedilol (COREG) 25 MG tablet Take 25 mg by mouth 2 (two) times daily with a meal.        . citalopram (CELEXA) 20 MG tablet Take 20 mg by mouth daily.        . Coenzyme Q10 (COQ10) 30 MG CAPS Take 1 capsule by mouth daily.       . fenofibrate 160 MG tablet TAKE 1 TABLET BY MOUTH EVERY DAY  30 tablet  5  . fish oil-omega-3 fatty acids 1000 MG capsule Take 1,000-2,000 mg by mouth daily.        . flyticasone (CUTIVATE) 0.005 % ointment Apply topically 3 (three) times daily as needed. For itching  60 g  2  . glucose blood (FREESTYLE LITE) test strip 1 each by Other route 2 (two) times daily. And lancets 2/day, 250.01  100 each  11  . insulin lispro (HUMALOG KWIKPEN) 100 UNIT/ML injection 3x a day (just before each meal) 35-35-40 units, and pen needles 4/day  45 mL  12  . omega-3 acid ethyl esters (LOVAZA) 1 G capsule Take 2 capsules (2 g total) by mouth 2 (two) times daily.      . potassium chloride (KLOR-CON 10) 10 MEQ CR tablet Take 3 tablets every morning  90 tablet  11  . rOPINIRole (REQUIP) 2 MG tablet Take 2 mg by mouth 2 (two)  times daily.        [name] torsemide (DEMADEX) 20 MG tablet Take 4 tablets every morning  120 tablet  11  . VITAMIN D, ERGOCALCIFEROL, PO Take 1 capsule by mouth daily.        [name] DISCONTD: insulin lispro (HUMALOG KWIKPEN) 100 UNIT/ML injection Inject 35 Units into the skin 3 (three) times daily before meals.  45 mL  12    Allergies  Allergen Reactions  . Morphine     REACTION: GI upset and headaches  . Sulfa Antibiotics Diarrhea  . Sulfonamide Derivatives     Family History  Problem Relation Age of Onset  . Heart disease Mother 60    MI  . Heart disease Father 41    MI  . Heart disease Other     MI- uncle and multiple aunts (aunts with CVAs)  . Cancer Other     Stomach cancer grandmother    BP 124/68  Pulse 81  Temp(Src) 97.7 F (36.5 C) (Oral)  Ht 5\' 2"  (1.575 m)  Wt 251 lb 12.8 oz (114.216 kg)  BMI 46.05 kg/m2  SpO2 90%   Review of Systems Denies fever and hypoglycemia    Objective:   Physical Exam VITAL SIGNS:  See vs page GENERAL: no distress Skin:  Slight diffuse excematous rash.       Assessment & Plan:  Rash, perceived due to levemir.  new DM, needs increased rx

## 2011-04-05 NOTE — Patient Instructions (Addendum)
Increase humalog to 3x a day (just before each meal) 35-35-40 units Change levemir to NPH insulin, 50 units qhs.   Please come back for a follow-up appointment in 1 month. check your blood sugar 2 times a day.  vary the time of day when you check, between before the 3 meals, and at bedtime.  also check if you have symptoms of your blood sugar being too high or too low.  please keep a record of the readings and bring it to your next appointment here.  please call us sooner if your blood sugar goes below 70, or if it stays over 200.   Please continue the "cutivate" ointment as needed for the itching. Despite the discomfort, it is important for your health to continue the insulin.  Make sure the pen needles have a number no lower than 31, followed by the letter "G."

## 2011-04-14 ENCOUNTER — Encounter: Payer: Self-pay | Admitting: Cardiology

## 2011-04-14 ENCOUNTER — Ambulatory Visit (INDEPENDENT_AMBULATORY_CARE_PROVIDER_SITE_OTHER): Payer: Medicare Other | Admitting: Cardiology

## 2011-04-14 ENCOUNTER — Encounter: Payer: Self-pay | Admitting: *Deleted

## 2011-04-14 ENCOUNTER — Other Ambulatory Visit: Payer: Self-pay | Admitting: Cardiology

## 2011-04-14 DIAGNOSIS — I5032 Chronic diastolic (congestive) heart failure: Secondary | ICD-10-CM | POA: Diagnosis not present

## 2011-04-14 DIAGNOSIS — E785 Hyperlipidemia, unspecified: Secondary | ICD-10-CM

## 2011-04-14 DIAGNOSIS — I10 Essential (primary) hypertension: Secondary | ICD-10-CM | POA: Diagnosis not present

## 2011-04-14 DIAGNOSIS — R0600 Dyspnea, unspecified: Secondary | ICD-10-CM

## 2011-04-14 DIAGNOSIS — R0602 Shortness of breath: Secondary | ICD-10-CM | POA: Diagnosis not present

## 2011-04-14 DIAGNOSIS — I509 Heart failure, unspecified: Secondary | ICD-10-CM

## 2011-04-14 MED ORDER — TORSEMIDE 20 MG PO TABS: ORAL_TABLET | ORAL | Status: DC

## 2011-04-14 NOTE — Progress Notes (Signed)
PCP: Dr. Felicity Coyer  66 yo with history of HTN, DM, hyperlipidemia, and chronic dyspnea returns for cardiac evaluation. Patient had an echo in 3/11 showing moderate LVH and preserved LV systolic function. However, there was a very large LV mid-cavity gradient with valsalva.  Patient developed quite significant exertional dyspnea to the point where she was short of breath walking around her house. She had a pulmonary evaluation with Dr. Vassie Loll but no primary lung problems were identified. Patient had an ETT-myoview which was negative for ischemia or infarction. Right heart cath at that time showed mildly elevated right heart filling pressures but normal PA pressure and normal PCWP. I started her on a beta blocker (Coreg) to try to lower her LV mid-cavity gradient (that likely occurs with exertion) and to better control BP.   Over the summer, she was started on torsemide by another cardiologist to replace Lasix and was on 100 mg daily.  She had a recent repeat echo in 11/12 with normal EF.  No significant LV mid-cavity gradient was described.  V/Q scan was negative for PE.  When I saw her in 11/12, I checked her creatinine and found it to be 3 with a BUN of 83.  I stopped valsartan and put her on amlodipine instead and I stopped her torsemide for a few days, later cutting the dose in half to 50 mg daily.  Creatinine returned to 1.2 but she gained weight and developed shortness of breath.  She is now taking torsemide 80 mg daily now.    She has significant back and knee pain that limits ambulation.  She walks with a cane.  Weight is up 9 lbs.  She continues to have significant lower extremity edema that appears to have worsened.  Dyspnea with exertion is worse recently, particularly the last 4-5 days.  She has been dyspneic just walking around her house.  Patient has chronic orthopnea and is currently sleeping on 4 pillows.    Recent lipids showed significant improvement in triglyceride level.   Labs (4/11): TGs  539, HDL 58, LDL 128, K 5.2, creatinine 1.3, TSH normal, BNP 14  Labs (5/11): K 3.9, creatinine 1.0  Labs (9/11): creatinine 1.1, BUN 43, LDL 145, HDL 60  Labs (10/11): K 4.3, creatinine 0.81, HCT 34, LDL 115, HDL 75  Labs (11/12): K 4.2, creatinine 1.58 => 3.0, HCT 32.2, BNP 172, LDL 97, TGs 980 Labs (12/12): K 3.4, creatinine 1.2 => 1.7 Labs (1/13): K 3.9, creatinine 1.1, TGs 281, LDL 121  Allergies (verified):  1) ! Sulfa  2) Morphine   Past Medical History:  1. Diabetes mellitus, type II  2. Hyperlipidemia: She has been unable to tolerate statins (has been on Vytorin, lovastatin, and Crestor) due to what sounds like rhabdo: developed muscle weakness and was told she had "muscle damage" with each of these statins. She has been told not to take statins anymore.  3. Hypertension: prev was told she had "white coat" HTN  4. Obesity  5. Restless Leg Syndrome since age 88yo  6. OA  7. peripheral neuropathy  8. OSA: Mild, does not use CPAP  9. Chronic nausea/diarrhea: ? IBS  10. Diastolic CHF: Echo (3/11) with normal LV size, moderate LVH, EF 65-70%, LV mid-cavity gradient reaching 63 mmHg with valsalva but minimal at rest, grade I diastolic dysfunction, cannot estimate PA systolic pressure (no TR doppler signal), RV normal. RHC (4/11): Mean RA 11, RV 37/13, PA 33/15, mean PCWP 12, CI 3.3. Repeat echo in 11/12 showed  mild LVH, EF 65-70%, no LV mid-cavity gradient was mentioned.   11. ETT-myoview (4/11): 5'30", stopped due to fatigue, normal EF, no evidence for scar or ischemia.   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   ROS: All systems reviewed and negative except as per HPI.   Current Outpatient Prescriptions  Medication Sig Dispense Refill  . amLODipine (NORVASC) 5 MG tablet Take 1 tablet (5 mg total) by mouth daily.  30 tablet  6  . aspirin 81 MG tablet Take 81 mg by mouth daily.       . carvedilol (COREG) 25 MG tablet Take 25 mg by mouth 2 (two) times daily with a meal.        . citalopram (CELEXA) 20 MG tablet Take 20 mg by mouth daily.       . Coenzyme Q10 (COQ10) 30 MG CAPS Take 1 capsule by mouth daily.       . fenofibrate 160 MG tablet TAKE 1 TABLET BY MOUTH EVERY DAY  30 tablet  5  . fish oil-omega-3 fatty acids 1000 MG capsule Take 1,000-2,000 mg by mouth daily.        . flyticasone (CUTIVATE) 0.005 % ointment Apply topically 3 (three) times daily as needed. For itching  60 g  2  . glucose blood (FREESTYLE LITE) test strip 1 each by Other route 2 (two) times daily. And lancets 2/day, 250.01  100 each  11  . insulin lispro (HUMALOG KWIKPEN) 100 UNIT/ML injection 3x a day (just before each meal) 35-35-40 units, and pen needles 4/day  45 mL  12  . insulin NPH (HUMULIN N PEN) 100 UNIT/ML injection Inject 50 Units into the skin at bedtime.  30 mL  12  . omega-3 acid ethyl esters (LOVAZA) 1 G capsule Take 2 capsules (2 g total) by mouth 2 (two) times daily.      . potassium chloride (KLOR-CON 10) 10 MEQ CR tablet Take 3 tablets every morning  90 tablet  11  . rOPINIRole (REQUIP) 2 MG tablet Take 2 mg by mouth 2 (two) times daily.        [name] VITAMIN D, ERGOCALCIFEROL, PO Take 1 capsule by mouth daily.        [name] DISCONTD: torsemide (DEMADEX) 20 MG tablet Take 4 tablets every morning  120 tablet  11  . torsemide (DEMADEX) 20  MG tablet Take 5 tablets daily at the same time in the morning  150 tablet  3  . DISCONTD: insulin lispro (HUMALOG KWIKPEN) 100 UNIT/ML injection Inject 35 Units into the skin 3 (three) times daily before meals.  45 mL  12    BP 142/70  Pulse 77  Ht 5\' 1"  (1.549 m)  Wt 255 lb (115.667 kg)  BMI 48.18 kg/m2 General: NAD, obese Neck: Thick, JVP 10 cm, no thyromegaly or  thyroid nodule.  Lungs: Clear to auscultation bilaterally with normal respiratory effort. CV: Nondisplaced PMI.  Heart regular S1/S2, no S3/S4, 2/6 SEM RUSB.  2+ chronic edema to knees bilaterally.  No carotid bruit.  Abdomen: Soft, nontender, no hepatosplenomegaly, no distention.  Skin: Venous stasis changes lower legs bilaterally.  Maculopapular rash lower legs and back of hands.  Neurologic: Alert and oriented x 3.  Psych: Normal affect. Extremities: No clubbing or cyanosis.

## 2011-04-14 NOTE — Assessment & Plan Note (Signed)
Mrs Melissa Myers returns with NYHA class IIIb symptoms and weight gain despite taking torsemide 80 mg daily. She has more lower extremity edema.  She does appear to have elevated JVP.  Her volume status is difficult as I suspect she has significant venous insufficiency which we are probably not going to fix with more diuretic.   - Increase torsemide to 100 mg daily.  - BMET in 1 week.  - Will repeat RHC to try to guide therapy.  - Needs to wear compression stockings.

## 2011-04-14 NOTE — Assessment & Plan Note (Signed)
BP is mildly elevated.  I am not going to change her amlodipine today.  

## 2011-04-14 NOTE — Patient Instructions (Signed)
Increase torsemide to 100mg  daily--take five 20mg  tabelts daily at the same time in the morning.  Your physician recommends that you return for lab work in: 1 week--BMET   Use compression stockings to help the swelling in your feet and legs.  Your physician has requested that you have a cardiac catheterization. Cardiac catheterization is used to diagnose and/or treat various heart conditions. Doctors may recommend this procedure for a number of different reasons. The most common reason is to evaluate chest pain. Chest pain can be a symptom of coronary artery disease (CAD), and cardiac catheterization can show whether plaque is narrowing or blocking your heart's arteries. This procedure is also used to evaluate the valves, as well as measure the blood flow and oxygen levels in different parts of your heart. For further information please visit [email or web address] Please follow instruction sheet, as given. Monday February 25,2013   Your physician recommends that you schedule a follow-up appointment in: 3 weeks with Dr Shirlee Latch.

## 2011-04-14 NOTE — Assessment & Plan Note (Signed)
Dangerously high triglycerides in the past (risk of pancreatitis).  Triglycerides better in 1/13.

## 2011-04-15 ENCOUNTER — Other Ambulatory Visit: Payer: Self-pay

## 2011-04-15 ENCOUNTER — Other Ambulatory Visit: Payer: Self-pay | Admitting: Internal Medicine

## 2011-04-15 ENCOUNTER — Ambulatory Visit: Payer: Medicare Other | Admitting: *Deleted

## 2011-04-15 ENCOUNTER — Telehealth: Payer: Self-pay

## 2011-04-15 DIAGNOSIS — I10 Essential (primary) hypertension: Secondary | ICD-10-CM

## 2011-04-15 DIAGNOSIS — I509 Heart failure, unspecified: Secondary | ICD-10-CM | POA: Diagnosis not present

## 2011-04-15 DIAGNOSIS — I5032 Chronic diastolic (congestive) heart failure: Secondary | ICD-10-CM

## 2011-04-15 LAB — CBC WITH DIFFERENTIAL/PLATELET
Basophils Absolute: 0 10*3/uL (ref 0.0–0.1)
Basophils Relative: 0 % (ref 0–1)
Eosinophils Absolute: 0.3 10*3/uL (ref 0.0–0.7)
HCT: 37 % (ref 36.0–46.0)
Hemoglobin: 11.5 g/dL — ABNORMAL LOW (ref 12.0–15.0)
MCH: 25.6 pg — ABNORMAL LOW (ref 26.0–34.0)
MCHC: 31.1 g/dL (ref 30.0–36.0)
Monocytes Absolute: 0.7 10*3/uL (ref 0.1–1.0)
Monocytes Relative: 8 % (ref 3–12)
Neutrophils Relative %: 60 % (ref 43–77)
RDW: 14.7 % (ref 11.5–15.5)

## 2011-04-15 LAB — PROTIME-INR: INR: 1 (ref <1.50)

## 2011-04-15 LAB — BASIC METABOLIC PANEL
CO2: 31 mEq/L (ref 19–32)
Chloride: 100 mEq/L (ref 96–112)
Creat: 0.97 mg/dL (ref 0.50–1.10)
Glucose, Bld: 189 mg/dL — ABNORMAL HIGH (ref 70–99)

## 2011-04-15 NOTE — Telephone Encounter (Signed)
Melissa Myers from [facility] called stating patient having procedure on Monday 04/18/11 and no lab done.Patient was called and told to come by office this afternoon to have cbcd,bmet,inr.

## 2011-04-18 ENCOUNTER — Encounter (HOSPITAL_BASED_OUTPATIENT_CLINIC_OR_DEPARTMENT_OTHER)
Admission: RE | Disposition: A | Discharge status: Home / Self Care | Payer: Self-pay | Source: Ambulatory Visit | Attending: Cardiology

## 2011-04-18 ENCOUNTER — Inpatient Hospital Stay (HOSPITAL_BASED_OUTPATIENT_CLINIC_OR_DEPARTMENT_OTHER)
Admission: RE | Admit: 2011-04-18 | Discharge: 2011-04-18 | Disposition: A | Discharge status: Home / Self Care | Payer: Medicare Other | Source: Ambulatory Visit | Attending: Cardiology | Admitting: Cardiology

## 2011-04-18 DIAGNOSIS — E669 Obesity, unspecified: Secondary | ICD-10-CM | POA: Insufficient documentation

## 2011-04-18 DIAGNOSIS — R0609 Other forms of dyspnea: Secondary | ICD-10-CM | POA: Diagnosis not present

## 2011-04-18 DIAGNOSIS — E119 Type 2 diabetes mellitus without complications: Secondary | ICD-10-CM | POA: Insufficient documentation

## 2011-04-18 DIAGNOSIS — E785 Hyperlipidemia, unspecified: Secondary | ICD-10-CM | POA: Insufficient documentation

## 2011-04-18 DIAGNOSIS — R0989 Other specified symptoms and signs involving the circulatory and respiratory systems: Secondary | ICD-10-CM | POA: Diagnosis not present

## 2011-04-18 DIAGNOSIS — R0600 Dyspnea, unspecified: Secondary | ICD-10-CM

## 2011-04-18 DIAGNOSIS — I10 Essential (primary) hypertension: Secondary | ICD-10-CM | POA: Insufficient documentation

## 2011-04-18 DIAGNOSIS — G4733 Obstructive sleep apnea (adult) (pediatric): Secondary | ICD-10-CM | POA: Insufficient documentation

## 2011-04-18 LAB — POCT I-STAT 3, VENOUS BLOOD GAS (G3P V)
Acid-Base Excess: 5 mmol/L — ABNORMAL HIGH (ref 0.0–2.0)
Bicarbonate: 31.6 mEq/L — ABNORMAL HIGH (ref 20.0–24.0)
TCO2: 33 mmol/L (ref 0–100)
pO2, Ven: 34 mmHg (ref 30.0–45.0)

## 2011-04-18 LAB — POCT I-STAT GLUCOSE: Glucose, Bld: 189 mg/dL — ABNORMAL HIGH (ref 70–99)

## 2011-04-18 SURGERY — JV RIGHT HEART CATHETERIZATION
Anesthesia: Moderate Sedation

## 2011-04-18 SURGERY — JV RIGHT HEART CATHETERIZATION
Anesthesia: Moderate Sedation | Laterality: Right

## 2011-04-18 MED ORDER — SODIUM CHLORIDE 0.9 % IJ SOLN: 3.0000 mL | INTRAMUSCULAR | Status: DC | PRN

## 2011-04-18 MED ORDER — SODIUM CHLORIDE 0.9 % IJ SOLN: 3.0000 mL | Freq: Two times a day (BID) | INTRAMUSCULAR | Status: DC

## 2011-04-18 MED ORDER — ASPIRIN 81 MG PO CHEW
324.0000 mg | CHEWABLE_TABLET | ORAL | Status: AC
  Administered 2011-04-18: 324 mg via ORAL

## 2011-04-18 MED ORDER — SODIUM CHLORIDE 0.9 % IV SOLN: 250.0000 mL | INTRAVENOUS | Status: DC

## 2011-04-18 MED ORDER — SODIUM CHLORIDE 0.9 % IV SOLN
250.0000 mL | INTRAVENOUS | Status: DC | PRN
Start: 2011-04-18 — End: 2011-04-18

## 2011-04-18 MED ORDER — ACETAMINOPHEN 325 MG PO TABS: 650.0000 mg | ORAL_TABLET | ORAL | Status: DC | PRN

## 2011-04-18 MED ORDER — ONDANSETRON HCL 4 MG/2ML IJ SOLN: 4.0000 mg | Freq: Four times a day (QID) | INTRAMUSCULAR | Status: DC | PRN

## 2011-04-18 NOTE — OR Nursing (Signed)
Tegaderm dressing applied, site level 0, bedrest began at 1115

## 2011-04-18 NOTE — OR Nursing (Signed)
Discharge instructions reviewed and signed, pt stated understanding, ambulated in hall without difficulty, site level 0, transported to husband's car via wheelchair 

## 2011-04-18 NOTE — H&P (View-Only) (Signed)
PCP: Dr. Leschber  65 yo with history of HTN, DM, hyperlipidemia, and chronic dyspnea returns for cardiac evaluation. Patient had an echo in 3/11 showing moderate LVH and preserved LV systolic function. However, there was a very large LV mid-cavity gradient with valsalva.  Patient developed quite significant exertional dyspnea to the point where she was short of breath walking around her house. She had a pulmonary evaluation with Dr. Alva but no primary lung problems were identified. Patient had an ETT-myoview which was negative for ischemia or infarction. Right heart cath at that time showed mildly elevated right heart filling pressures but normal PA pressure and normal PCWP. I started her on a beta blocker (Coreg) to try to lower her LV mid-cavity gradient (that likely occurs with exertion) and to better control BP.   Over the summer, she was started on torsemide by another cardiologist to replace Lasix and was on 100 mg daily.  She had a recent repeat echo in 11/12 with normal EF.  No significant LV mid-cavity gradient was described.  V/Q scan was negative for PE.  When I saw her in 11/12, I checked her creatinine and found it to be 3 with a BUN of 83.  I stopped valsartan and put her on amlodipine instead and I stopped her torsemide for a few days, later cutting the dose in half to 50 mg daily.  Creatinine returned to 1.2 but she gained weight and developed shortness of breath.  She is now taking torsemide 80 mg daily now.    She has significant back and knee pain that limits ambulation.  She walks with a cane.  Weight is up 9 lbs.  She continues to have significant lower extremity edema that appears to have worsened.  Dyspnea with exertion is worse recently, particularly the last 4-5 days.  She has been dyspneic just walking around her house.  Patient has chronic orthopnea and is currently sleeping on 4 pillows.    Recent lipids showed significant improvement in triglyceride level.   Labs (4/11): TGs  539, HDL 58, LDL 128, K 5.2, creatinine 1.3, TSH normal, BNP 14  Labs (5/11): K 3.9, creatinine 1.0  Labs (9/11): creatinine 1.1, BUN 43, LDL 145, HDL 60  Labs (10/11): K 4.3, creatinine 0.81, HCT 34, LDL 115, HDL 75  Labs (11/12): K 4.2, creatinine 1.58 => 3.0, HCT 32.2, BNP 172, LDL 97, TGs 980 Labs (12/12): K 3.4, creatinine 1.2 => 1.7 Labs (1/13): K 3.9, creatinine 1.1, TGs 281, LDL 121  Allergies (verified):  1) ! Sulfa  2) Morphine   Past Medical History:  1. Diabetes mellitus, type II  2. Hyperlipidemia: She has been unable to tolerate statins (has been on Vytorin, lovastatin, and Crestor) due to what sounds like rhabdo: developed muscle weakness and was told she had "muscle damage" with each of these statins. She has been told not to take statins anymore.  3. Hypertension: prev was told she had "white coat" HTN  4. Obesity  5. Restless Leg Syndrome since age 10yo  6. OA  7. peripheral neuropathy  8. OSA: Mild, does not use CPAP  9. Chronic nausea/diarrhea: ? IBS  10. Diastolic CHF: Echo (3/11) with normal LV size, moderate LVH, EF 65-70%, LV mid-cavity gradient reaching 63 mmHg with valsalva but minimal at rest, grade I diastolic dysfunction, cannot estimate PA systolic pressure (no TR doppler signal), RV normal. RHC (4/11): Mean RA 11, RV 37/13, PA 33/15, mean PCWP 12, CI 3.3. Repeat echo in 11/12 showed   mild LVH, EF 65-70%, no LV mid-cavity gradient was mentioned.   11. ETT-myoview (4/11): 5'30", stopped due to fatigue, normal EF, no evidence for scar or ischemia.   Family History:  Family History of Alcoholism/Addiction (parent)  Family History Diabetes 1st degree relative (grandparent)  Family History High cholesterol (parent, grandparent)  Family History Hypertension (parent, grandparent)  Family History Lung cancer (grandparent)  Stomach cancer (grandmother)  Celiac Sprue daughter  Mother with MI at 52, father with MI at 54, uncle with MI at 42, brother with stents in  his 60s, multiple aunts with MIs and CVAs   Social History:  Never Smoked  no alcohol  married, lives with spouse and her mother  retired secretary - now housewife  Alcohol Use - no  Illicit Drug Use - no  Review of Systems  All systems reviewed and negative except as per HPI.   ROS: All systems reviewed and negative except as per HPI.   Current Outpatient Prescriptions  Medication Sig Dispense Refill  . amLODipine (NORVASC) 5 MG tablet Take 1 tablet (5 mg total) by mouth daily.  30 tablet  6  . aspirin 81 MG tablet Take 81 mg by mouth daily.       . carvedilol (COREG) 25 MG tablet Take 25 mg by mouth 2 (two) times daily with a meal.        . citalopram (CELEXA) 20 MG tablet Take 20 mg by mouth daily.       . Coenzyme Q10 (COQ10) 30 MG CAPS Take 1 capsule by mouth daily.       . fenofibrate 160 MG tablet TAKE 1 TABLET BY MOUTH EVERY DAY  30 tablet  5  . fish oil-omega-3 fatty acids 1000 MG capsule Take 1,000-2,000 mg by mouth daily.        . flyticasone (CUTIVATE) 0.005 % ointment Apply topically 3 (three) times daily as needed. For itching  60 g  2  . glucose blood (FREESTYLE LITE) test strip 1 each by Other route 2 (two) times daily. And lancets 2/day, 250.01  100 each  11  . insulin lispro (HUMALOG KWIKPEN) 100 UNIT/ML injection 3x a day (just before each meal) 35-35-40 units, and pen needles 4/day  45 mL  12  . insulin NPH (HUMULIN N PEN) 100 UNIT/ML injection Inject 50 Units into the skin at bedtime.  30 mL  12  . omega-3 acid ethyl esters (LOVAZA) 1 G capsule Take 2 capsules (2 g total) by mouth 2 (two) times daily.      . potassium chloride (KLOR-CON 10) 10 MEQ CR tablet Take 3 tablets every morning  90 tablet  11  . rOPINIRole (REQUIP) 2 MG tablet Take 2 mg by mouth 2 (two) times daily.        . VITAMIN D, ERGOCALCIFEROL, PO Take 1 capsule by mouth daily.        . DISCONTD: torsemide (DEMADEX) 20 MG tablet Take 4 tablets every morning  120 tablet  11  . torsemide (DEMADEX) 20  MG tablet Take 5 tablets daily at the same time in the morning  150 tablet  3  . DISCONTD: insulin lispro (HUMALOG KWIKPEN) 100 UNIT/ML injection Inject 35 Units into the skin 3 (three) times daily before meals.  45 mL  12    BP 142/70  Pulse 77  Ht 5' 1" (1.549 m)  Wt 255 lb (115.667 kg)  BMI 48.18 kg/m2 General: NAD, obese Neck: Thick, JVP 10 cm, no thyromegaly or   thyroid nodule.  Lungs: Clear to auscultation bilaterally with normal respiratory effort. CV: Nondisplaced PMI.  Heart regular S1/S2, no S3/S4, 2/6 SEM RUSB.  2+ chronic edema to knees bilaterally.  No carotid bruit.  Abdomen: Soft, nontender, no hepatosplenomegaly, no distention.  Skin: Venous stasis changes lower legs bilaterally.  Maculopapular rash lower legs and back of hands.  Neurologic: Alert and oriented x 3.  Psych: Normal affect. Extremities: No clubbing or cyanosis.   

## 2011-04-18 NOTE — Procedures (Signed)
   Cardiac Catheterization Procedure Note  Name: CARENA STREAM MRN: [id number] DOB: 06/24/1945  Procedure: Right Heart Cath  Indication:    Procedural Details: The right groin was prepped, draped, and anesthetized with 1% lidocaine. Using the modified Seldinger technique a 7 French sheath was placed in the right femoral vein. A Swan-Ganz catheter was used for the right heart catheterization. Standard protocol was followed for recording of right heart pressures and sampling of oxygen saturations. Fick cardiac output was calculated. There were no immediate procedural complications. The patient was transferred to the post catheterization recovery area for further monitoring.  Procedural Findings: Hemodynamics (mmHg) RA mean 11 RV 41/13 PA 34/17, mean 24 PCWP mean 15  Oxygen saturations: PA 63% AO 91%  Cardiac Output (Fick) 6  Cardiac Index (Fick) 2.9   Final Conclusions:  Optimized left-sided filling pressure with PCWP 15 mmHg.  Mildly elevated right-sided filling pressure.  No pulmonary hypertension.    Recommendations: Continue current torsemide regimen.  I think a significant amount of her dyspnea is due to obesity/deconditioning.    Marca Ancona 04/18/2011, 11:06 AM

## 2011-04-18 NOTE — Interval H&P Note (Signed)
History and Physical Interval Note:  04/18/2011 10:34 AM  Melissa Myers  has presented today for surgery, with the diagnosis of cp  The various methods of treatment have been discussed with the patient and family. After consideration of risks, benefits and other options for treatment, the patient has consented to  Procedure(s) (LRB): JV RIGHT HEART CATHETERIZATION (N/A) as a surgical intervention .  The patients' history has been reviewed, patient examined, no change in status, stable for surgery.  I have reviewed the patients' chart and labs.  Questions were answered to the patient's satisfaction.     [name] [employer]

## 2011-04-18 NOTE — OR Nursing (Signed)
Dr McLean at bedside to discuss results and treatment plan with pt and family 

## 2011-04-21 ENCOUNTER — Other Ambulatory Visit (INDEPENDENT_AMBULATORY_CARE_PROVIDER_SITE_OTHER): Payer: Medicare Other

## 2011-04-21 ENCOUNTER — Telehealth: Payer: Self-pay | Admitting: *Deleted

## 2011-04-21 DIAGNOSIS — I5032 Chronic diastolic (congestive) heart failure: Secondary | ICD-10-CM | POA: Diagnosis not present

## 2011-04-21 DIAGNOSIS — R0602 Shortness of breath: Secondary | ICD-10-CM

## 2011-04-21 DIAGNOSIS — I509 Heart failure, unspecified: Secondary | ICD-10-CM | POA: Diagnosis not present

## 2011-04-21 LAB — BASIC METABOLIC PANEL
CO2: 32 mEq/L (ref 19–32)
Chloride: 101 mEq/L (ref 96–112)
Creatinine, Ser: 1.2 mg/dL (ref 0.4–1.2)
Glucose, Bld: 159 mg/dL — ABNORMAL HIGH (ref 70–99)

## 2011-04-21 MED ORDER — POTASSIUM CHLORIDE CRYS ER 20 MEQ PO TBCR: EXTENDED_RELEASE_TABLET | ORAL | Status: DC

## 2011-04-21 MED ORDER — POTASSIUM CHLORIDE ER 10 MEQ PO TBCR: EXTENDED_RELEASE_TABLET | ORAL | Status: DC

## 2011-04-21 NOTE — Telephone Encounter (Signed)
Notes Recorded by Jacqlyn Krauss, RN on 04/21/2011 at 4:31 PM Reviewed with Dr Shirlee Latch. He recommended pt increase KCL from 3 tablets daily to 5 tablets daily- 3 in the morning and 2 in the evening. Pt aware. Preliminarily reviewed by Triage. Awaiting MD review and signature.

## 2011-04-26 ENCOUNTER — Telehealth: Payer: Self-pay | Admitting: Pulmonary Disease

## 2011-04-26 NOTE — Telephone Encounter (Signed)
Download from [place] shows poor usage - she was apparently having panic attacks Has she been able to use CPAP in Feb? Pl arrange oV

## 2011-04-26 NOTE — Telephone Encounter (Signed)
lmomtcb x 1 on home #, and tried cell #, after ringing multiple times received a recording stating customer not available.

## 2011-04-29 NOTE — Telephone Encounter (Signed)
Pt states she has not used it in weeks because of the pressure is too much. Scheduled for follow up 06-09-11 @ 2:30pm.

## 2011-05-03 ENCOUNTER — Encounter: Payer: Self-pay | Admitting: Pulmonary Disease

## 2011-05-03 DIAGNOSIS — Z6841 Body Mass Index (BMI) 40.0 and over, adult: Secondary | ICD-10-CM | POA: Diagnosis not present

## 2011-05-03 DIAGNOSIS — E1142 Type 2 diabetes mellitus with diabetic polyneuropathy: Secondary | ICD-10-CM | POA: Diagnosis not present

## 2011-05-03 DIAGNOSIS — E1149 Type 2 diabetes mellitus with other diabetic neurological complication: Secondary | ICD-10-CM | POA: Diagnosis not present

## 2011-05-03 DIAGNOSIS — I509 Heart failure, unspecified: Secondary | ICD-10-CM | POA: Diagnosis not present

## 2011-05-05 ENCOUNTER — Encounter: Payer: Self-pay | Admitting: Cardiology

## 2011-05-05 ENCOUNTER — Ambulatory Visit: Payer: Medicare Other | Admitting: Endocrinology

## 2011-05-05 ENCOUNTER — Ambulatory Visit (INDEPENDENT_AMBULATORY_CARE_PROVIDER_SITE_OTHER): Payer: Medicare Other | Admitting: Cardiology

## 2011-05-05 VITALS — BP 110/68 | HR 63 | Ht 61.0 in | Wt 255.8 lb

## 2011-05-05 DIAGNOSIS — I509 Heart failure, unspecified: Secondary | ICD-10-CM

## 2011-05-05 DIAGNOSIS — R0602 Shortness of breath: Secondary | ICD-10-CM | POA: Diagnosis not present

## 2011-05-05 DIAGNOSIS — I5032 Chronic diastolic (congestive) heart failure: Secondary | ICD-10-CM

## 2011-05-05 DIAGNOSIS — E785 Hyperlipidemia, unspecified: Secondary | ICD-10-CM

## 2011-05-05 DIAGNOSIS — R0989 Other specified symptoms and signs involving the circulatory and respiratory systems: Secondary | ICD-10-CM

## 2011-05-05 MED ORDER — HYDRALAZINE HCL 50 MG PO TABS: 50.0000 mg | ORAL_TABLET | Freq: Three times a day (TID) | ORAL | Status: DC

## 2011-05-05 NOTE — Assessment & Plan Note (Signed)
Ongoing exertional dyspnea, as above.  I am going to have her get an ETT-myoview to make sure that we are not missing coronary ischemia.

## 2011-05-05 NOTE — Assessment & Plan Note (Signed)
Mrs Meath returns with NYHA class III symptoms.  She has lower extremity edema today but does not have significant JVD.  RHC showed mild elevation in right heart filling pressure but near normal left heart filling pressures. I do not think that CHF is the primary cause of her shortness of breath.  I think that obesity and deconditioning plays a large role.  She has significant lower extremity edema still, but I think that trying to completely get rid of this edema will cause renal decompensation.  I have suggested that she wear compression stockings during the day.  I will also stop amlodipine to see if this will help with the lower extremity edema.  Given CKD and history of ARF, I am not going to replace this with an ACEI.  I will instead put her on hydralazine 50 mg tid for BP control.  After a month, if edema does not improve, I will put her back on amlodipine.  I will continue torsemide at the current dose.  Finally, she needs to get more active.

## 2011-05-05 NOTE — Patient Instructions (Signed)
Stop amlodipine.  Start hydralazine 50mg  three times a day.   Your physician has requested that you have en exercise stress myoview. For further information please visit [email or web address] Please follow instruction sheet, as given.  Your physician recommends that you schedule a follow-up appointment in: 1 month with Dr Shirlee Latch.

## 2011-05-05 NOTE — Assessment & Plan Note (Signed)
Dangerously high triglycerides in the past (risk of pancreatitis).  Triglycerides better in 1/13.  

## 2011-05-05 NOTE — Progress Notes (Signed)
PCP: Dr. Felicity Coyer  66 yo with history of HTN, DM, hyperlipidemia, and chronic dyspnea returns for cardiac evaluation. Patient had an echo in 3/11 showing moderate LVH and preserved LV systolic function. However, there was a very large LV mid-cavity gradient with valsalva.  Patient developed quite significant exertional dyspnea to the point where she was short of breath walking around her house. She had a pulmonary evaluation with Dr. Vassie Loll but no primary lung problems were identified. Patient had an ETT-myoview which was negative for ischemia or infarction. Right heart cath at that time showed mildly elevated right heart filling pressures but normal PA pressure and normal PCWP. I started her on a beta blocker (Coreg) to try to lower her LV mid-cavity gradient (that likely occurs with exertion) and to better control BP.   Over the summer, she was started on torsemide by another cardiologist to replace Lasix and was on 100 mg daily.  She had a recent repeat echo in 11/12 with normal EF.  No significant LV mid-cavity gradient was described.  V/Q scan was negative for PE.  When I saw her in 11/12, I checked her creatinine and found it to be 3 with a BUN of 83.  I stopped valsartan and put her on amlodipine instead and I stopped her torsemide for a few days, later cutting the dose in half to 50 mg daily.  Creatinine returned to 1.2 but she gained weight and developed shortness of breath.  I did a right heart cath again on 2/13, with near normal left-sided filling pressure and mildly elevated right-sided filling pressure.   She is now on torsemide 100 mg daily.  She has significant back and knee pain that limits ambulation.  She walks with a cane.  Weight is stable since last appointment.  Lower extremity edema is still significant but may now be a bit better.  She has unchanged exertional dyspnea after walking about 100 feet.  No chest pain.  She is wearing her compression stockings occasionally.  Patient has  chronic orthopnea and is currently sleeping on 4 pillows.    ECG: NSR, RBBB  Labs (4/11): TGs 539, HDL 58, LDL 128, K 5.2, creatinine 1.3, TSH normal, BNP 14  Labs (5/11): K 3.9, creatinine 1.0  Labs (9/11): creatinine 1.1, BUN 43, LDL 145, HDL 60  Labs (10/11): K 4.3, creatinine 0.81, HCT 34, LDL 115, HDL 75  Labs (11/12): K 4.2, creatinine 1.58 => 3.0, HCT 32.2, BNP 172, LDL 97, TGs 980 Labs (12/12): K 3.4, creatinine 1.2 => 1.7 Labs (1/13): K 3.9, creatinine 1.1, TGs 281, LDL 121 Labs (2/13): K 3.3, creatinine 1.2  Allergies (verified):  1) ! Sulfa  2) Morphine   Past Medical History:  1. Diabetes mellitus, type II  2. Hyperlipidemia: She has been unable to tolerate statins (has been on Vytorin, lovastatin, and Crestor) due to what sounds like rhabdo: developed muscle weakness and was told she had "muscle damage" with each of these statins. She has been told not to take statins anymore.  3. Hypertension: prev was told she had "white coat" HTN  4. Obesity  5. Restless Leg Syndrome since age 7yo  6. OA  7. peripheral neuropathy  8. OSA: Mild, does not use CPAP  9. Chronic nausea/diarrhea: ? IBS  10. Diastolic CHF: Echo (3/11) with normal LV size, moderate LVH, EF 65-70%, LV mid-cavity gradient reaching 63 mmHg with valsalva but minimal at rest, grade I diastolic dysfunction, cannot estimate PA systolic pressure (no TR doppler  signal), RV normal. RHC (4/11): Mean RA 11, RV 37/13, PA 33/15, mean PCWP 12, CI 3.3. Repeat echo in 11/12 showed mild LVH, EF 65-70%, no LV mid-cavity gradient was mentioned.   11. ETT-myoview (4/11): 5'30", stopped due to fatigue, normal EF, no evidence for scar or ischemia.   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   ROS: All systems reviewed and negative except as per HPI.   Current Outpatient Prescriptions  Medication Sig Dispense Refill  . aspirin 81 MG tablet Take 81 mg by mouth daily.       . carvedilol (COREG) 25 MG tablet Take 25 mg by mouth 2 (two) times daily with a meal.        . citalopram (CELEXA) 20 MG tablet Take 20 mg by mouth daily.       . Coenzyme Q10 (COQ10) 30 MG CAPS Take 1 capsule by mouth daily.       . fenofibrate 160 MG tablet TAKE 1 TABLET BY MOUTH EVERY DAY  30 tablet  5  . fish oil-omega-3 fatty acids 1000 MG capsule Take 1,000-2,000 mg by mouth daily.        . flyticasone (CUTIVATE) 0.005 % ointment Apply topically 3 (three) times daily as needed. For itching  60 g  2  . glucose blood (FREESTYLE LITE) test strip 1 each by Other route 2 (two) times daily. And lancets 2/day, 250.01  100 each  11  . insulin detemir (LEVEMIR) 100 UNIT/ML injection Inject 50 Units into the skin at bedtime.      . insulin lispro (HUMALOG KWIKPEN) 100 UNIT/ML injection 3x a day (just before each meal) 35-35-40 units, and pen needles 4/day  45 mL  12  . omega-3 acid ethyl esters (LOVAZA) 1 G capsule Take 2 capsules (2 g total) by mouth 2 (two) times daily.      . potassium chloride (KLOR-CON 10) 10 MEQ tablet Take 3 tablets in the morning and 2 tablets in the evening  150 tablet  6  . rOPINIRole (REQUIP) 2 MG tablet TAKE 2 TABLETS BY MOUTH EVERY DAY OR AS DIRECTED  60 tablet  3  . torsemide (DEMADEX) 20 MG tablet Take 5 tablets daily at the same time in the morning  150 tablet  3  .  VITAMIN D, ERGOCALCIFEROL, PO Take 1 capsule by mouth daily.        [name] DISCONTD: rOPINIRole (REQUIP) 2 MG tablet Take 2 mg by mouth 2 (two) times daily.        . hydrALAZINE (APRESOLINE) 50 MG tablet Take 1 tablet (50 mg total) by mouth 3 (three) times daily.  90 tablet  6  . DISCONTD: insulin lispro (HUMALOG KWIKPEN) 100 UNIT/ML injection Inject 35 Units into the skin 3 (three) times daily before meals.  45 mL  12  BP 110/68  Pulse 63  Ht 5\' 1"  (1.549 m)  Wt 255 lb 12.8 oz (116.03 kg)  BMI 48.33 kg/m2 General: NAD, obese Neck: Thick, JVP 7 cm, no thyromegaly or thyroid nodule.  Lungs: Clear to auscultation bilaterally with normal respiratory effort. CV: Nondisplaced PMI.  Heart regular S1/S2, no S3/S4, 2/6 SEM RUSB.  2+ chronic edema to knees bilaterally.  No carotid bruit.  Abdomen: Soft, nontender, no hepatosplenomegaly, no distention.  Skin: Venous stasis changes lower legs bilaterally.  Maculopapular rash lower legs and back of hands.  Neurologic: Alert and oriented x 3.  Psych: Normal affect. Extremities: No clubbing or cyanosis.

## 2011-05-10 ENCOUNTER — Encounter (HOSPITAL_COMMUNITY): Payer: Medicare Other

## 2011-05-10 DIAGNOSIS — H2510 Age-related nuclear cataract, unspecified eye: Secondary | ICD-10-CM | POA: Diagnosis not present

## 2011-05-11 ENCOUNTER — Ambulatory Visit (INDEPENDENT_AMBULATORY_CARE_PROVIDER_SITE_OTHER): Payer: Medicare Other | Admitting: Internal Medicine

## 2011-05-11 ENCOUNTER — Encounter: Payer: Self-pay | Admitting: Internal Medicine

## 2011-05-11 VITALS — BP 140/82 | HR 75 | Temp 99.1°F | Ht 62.0 in | Wt 249.4 lb

## 2011-05-11 DIAGNOSIS — I872 Venous insufficiency (chronic) (peripheral): Secondary | ICD-10-CM

## 2011-05-11 DIAGNOSIS — N058 Unspecified nephritic syndrome with other morphologic changes: Secondary | ICD-10-CM

## 2011-05-11 DIAGNOSIS — I8310 Varicose veins of unspecified lower extremity with inflammation: Secondary | ICD-10-CM

## 2011-05-11 DIAGNOSIS — I5032 Chronic diastolic (congestive) heart failure: Secondary | ICD-10-CM | POA: Diagnosis not present

## 2011-05-11 DIAGNOSIS — E1029 Type 1 diabetes mellitus with other diabetic kidney complication: Secondary | ICD-10-CM

## 2011-05-11 DIAGNOSIS — I509 Heart failure, unspecified: Secondary | ICD-10-CM | POA: Diagnosis not present

## 2011-05-11 DIAGNOSIS — G4730 Sleep apnea, unspecified: Secondary | ICD-10-CM

## 2011-05-11 DIAGNOSIS — E1065 Type 1 diabetes mellitus with hyperglycemia: Secondary | ICD-10-CM | POA: Diagnosis not present

## 2011-05-11 NOTE — Assessment & Plan Note (Signed)
Chronic hypersomnia/insomnia - only mild apnea on 05/2009 sleep study but high suspicion of same -  ongoing pulm (alva) eval reviewed  - unable to tol trial recent home split study and CPAP Working on weight loss and continue requip for RLS symptoms Melissa Myers

## 2011-05-11 NOTE — Assessment & Plan Note (Signed)
levemir causing itch -  Changed endo for new opinion - now on TID humulin continue to work with dr Sharl Ma as ongoing, feels improved control and resolution of side effects  Lab Results  Component Value Date   HGBA1C 9.0* 05/03/2010

## 2011-05-11 NOTE — Patient Instructions (Signed)
It was good to see you today. We have reviewed your recent medical visits, weights and tests/medications - Medications reviewed, no changes at this time. Please schedule followup in 4-6 months, call sooner if problems.

## 2011-05-11 NOTE — Progress Notes (Signed)
Subjective:    Patient ID: Melissa Myers, female    DOB: 02/26/1945, 66 y.o.   MRN: [id number]  HPI  here for follow up - Again reiterated personal commitment to management of her own health, previously consumed by care of ill parents over past 2 years and neglecting her personal medical needs  Reviewed chronic medical issues:  Rash distal lower extremities bilaterally. ?Stasis dermatitis -Onset Spring 2012, intermittent but gradually worsening. Unresolved with various courses of antibiotics or topical steroids. Associated with itching. No other rash on body. S/p local derm eval, pending opinion [facility] 08/2011 for same  Dyspnea on exertion. Unchanged and chronic; Worse with increased weight gain and when laying flat. Not improved with increased diuretics. CT negative for PE on January 03, 2011 in ER, neg RHC 03/2011 - planning ETT myoview with cards  Insomnia - manifest as restless legs for which she takes Requip, but little relief Falls asleep during daytime, 2 events while driving Feels "smothered" when laying supine Prior sleep study 2011 "did not show apnea" but followup evaluation December 2012 feels sleep apnea highly probable based on body habitus - unable to tol home CPAP split study  DM2 -variable compliance with prescribed medications; intolerant of metformin due to GI side effects. Does not regularly check sugars. Returned to care of Dr. Sharl Ma due to complications/side effects with levemir  Low back pain, chronic. Worse with exertion, relieved with sitting or rest. Denies trauma or falls. No radiation of pain. No fever or urinary change. No relief with over-the-counter medications. Improving with Ultracet and PT  Past Medical History  Diagnosis Date  . Type II or unspecified type diabetes mellitus without mention of complication, not stated as uncontrolled     insulin dep  . Hyperlipidemia     hx rhabdo on statins  . Hypertension   . CHF (congestive heart failure)    diastolic  . OSA (obstructive sleep apnea)     insignificant apnea events 05/2009 sleep study  . Chronic diarrhea     a/w nausea - felt related to IBS  . RLS (restless legs syndrome)   . Osteoarthritis   . Stasis dermatitis      Review of Systems  Constitutional: Positive for fatigue. Negative for fever and unexpected weight change.  Respiratory: Positive for shortness of breath (chronic). Negative for cough, chest tightness and wheezing.   Cardiovascular: Negative for chest pain and palpitations. Leg swelling: chronic.  Skin: Positive for rash. Negative for wound.       Objective:   Physical Exam  BP 140/82  Pulse 75  Temp(Src) 99.1 F (37.3 C) (Oral)  Ht 5\' 2"  (1.575 m)  Wt 249 lb 6.4 oz (113.127 kg)  BMI 45.62 kg/m2  SpO2 96% Wt Readings from Last 3 Encounters:  05/11/11 249 lb 6.4 oz (113.127 kg)  05/05/11 255 lb 12.8 oz (116.03 kg)  04/18/11 255 lb (115.667 kg)   Constitutional: She is overweight; appears well-developed and well-nourished. No distress. mom at side Neck: Thick; Normal range of motion. Neck supple. No JVD present. No thyromegaly present.  Cardiovascular: Normal rate, regular rhythm and normal heart sounds.  No murmur heard. chronic tight/woody 2+ BLE edema Pulmonary/Chest: Effort normal and breath sounds diminished at bases bilaterally. No respiratory distress. She has no wheezes.  Skin: chronic thickening and mild erythema of distal BLE, stasis dermatitis without change - few pustules on anterior shin or weeping. Evidence for excoriation - also pustule rash on L>R extensor surface of hands, no UE  involvement above wrist Psychiatric: She has a normal mood and affect. Her behavior is normal. Judgment and thought content normal.       Lab Results  Component Value Date   WBC 9.2 04/15/2011   HGB 11.5* 04/15/2011   HCT 37.0 04/15/2011   PLT 345 04/15/2011   CHOL 243* 02/28/2011   TRIG 281.0* 02/28/2011   HDL 53.10 02/28/2011   LDLDIRECT 120.9 02/28/2011   ALT  27 07/21/2010   AST 36 07/21/2010   NA 141 04/21/2011   K 3.3* 04/21/2011   CL 101 04/21/2011   CREATININE 1.2 04/21/2011   BUN 25* 04/21/2011   CO2 32 04/21/2011   TSH 1.35 05/29/2009   INR 1.00 04/15/2011   HGBA1C 9.0* 05/03/2010     Assessment & Plan:  See problem list. Medications and labs reviewed today.  Time spent with pt/family today 25 minutes, greater than 50% time spent counseling patient on diabetes, CHF, dermatitis, OSA and medication review. Also review of interval records/tests/labs with pt

## 2011-05-11 NOTE — Assessment & Plan Note (Signed)
Chronic - ?other inflammatory component given "itch" and involvement of extensor surface of hands - but not responsive to topical steroids Pending eval at [facility] derm - reassurance provided to pt re: same

## 2011-05-11 NOTE — Assessment & Plan Note (Signed)
Following with cardiology for same, volume status complicated by renal insufficiency and chronic venous insufficiency Remains on torsemide diuretics (more effective than furosemide), change amlodipine to hydralazine and s/p RHC 04/06/11 Planning ETT myoview (unable to do LHC due to renal insuff) Per cardiology, also on Coreg to decrease of the mid cavity gradient Medications and interval hx reviewed, no changes by me today

## 2011-05-12 ENCOUNTER — Telehealth: Payer: Self-pay | Admitting: *Deleted

## 2011-05-12 NOTE — Telephone Encounter (Signed)
Anette Guarneri - appt cancel << Less Detail     appt cancel      Lela Vivia [place]       Sent: Wed May 11, 2011  3:03 PM    To: Britt Bolognese; Jacqlyn Krauss, RN       SOLARA GOODCHILD    MRN: [id number] DOB: 08-19-1945    Pt Home: [phone]               Message     Pt parent had a stroke and she cancelled the [facility] and provider appt and will rsc at a later date

## 2011-05-16 ENCOUNTER — Other Ambulatory Visit: Payer: Self-pay

## 2011-05-16 ENCOUNTER — Encounter (HOSPITAL_COMMUNITY): Payer: Medicare Other

## 2011-05-16 NOTE — Telephone Encounter (Signed)
Pt on max dose of requip for RLS (4mg Melissa Myers)-  Recommend adding gabapentin 300mg  qhs - erx done, we can titrate this if needed later on

## 2011-05-16 NOTE — Telephone Encounter (Signed)
Pt called stating that current dosage of Requip is not helping with symptoms of restless leg syndrome. Pt is requesting MD's advisement on possible increase, please advise.

## 2011-05-17 MED ORDER — GABAPENTIN 300 MG PO CAPS: 300.0000 mg | ORAL_CAPSULE | Freq: Every day | ORAL | Status: DC

## 2011-05-17 NOTE — Telephone Encounter (Signed)
Pt advised of Rx and pharmacy 

## 2011-06-06 ENCOUNTER — Ambulatory Visit: Payer: Medicare Other | Admitting: Cardiology

## 2011-06-09 ENCOUNTER — Ambulatory Visit: Payer: Medicare Other | Admitting: Pulmonary Disease

## 2011-06-22 DIAGNOSIS — I509 Heart failure, unspecified: Secondary | ICD-10-CM | POA: Diagnosis not present

## 2011-06-22 DIAGNOSIS — E1142 Type 2 diabetes mellitus with diabetic polyneuropathy: Secondary | ICD-10-CM | POA: Diagnosis not present

## 2011-06-22 DIAGNOSIS — E1149 Type 2 diabetes mellitus with other diabetic neurological complication: Secondary | ICD-10-CM | POA: Diagnosis not present

## 2011-06-23 ENCOUNTER — Telehealth: Payer: Self-pay | Admitting: Cardiology

## 2011-06-23 ENCOUNTER — Other Ambulatory Visit: Payer: Self-pay | Admitting: *Deleted

## 2011-06-23 NOTE — Telephone Encounter (Signed)
Patient was seen  By her Endocrinologist  Yesterday and was  recommended for her to see her cardiologist because of her 02 saturation was 82 % and SOB . Patient said that she has gained 5 to 10 lbs a week. She weights 278 lbs now, a few weeks ago she was 250 lbs she is retaining fluids. A friend  that works in the hospital recommended for her to see  Dr. Gala Romney. She said that Dr. Shirlee Latch ask her to walk, and she can't walk. She would like to be referred to Dr. Gala Romney.

## 2011-06-23 NOTE — Telephone Encounter (Signed)
Patient has been referred to Dr. Gala Romney CHF clinic. Patient has an appointment on Friday 07/01/11 at 10:30 AM, patient aware.

## 2011-06-23 NOTE — Telephone Encounter (Signed)
Pt is SOB & wants a referral to see Dr. Gala Romney

## 2011-06-23 NOTE — Telephone Encounter (Signed)
That would be fine 

## 2011-07-01 ENCOUNTER — Ambulatory Visit (HOSPITAL_COMMUNITY)
Admission: RE | Admit: 2011-07-01 | Discharge: 2011-07-01 | Disposition: A | Discharge status: Home / Self Care | Payer: Medicare Other | Source: Ambulatory Visit | Attending: Internal Medicine | Admitting: Internal Medicine

## 2011-07-01 VITALS — BP 138/66 | HR 80 | Wt 260.5 lb

## 2011-07-01 DIAGNOSIS — R0609 Other forms of dyspnea: Secondary | ICD-10-CM | POA: Diagnosis not present

## 2011-07-01 DIAGNOSIS — I8310 Varicose veins of unspecified lower extremity with inflammation: Secondary | ICD-10-CM | POA: Diagnosis not present

## 2011-07-01 DIAGNOSIS — R0989 Other specified symptoms and signs involving the circulatory and respiratory systems: Secondary | ICD-10-CM | POA: Insufficient documentation

## 2011-07-01 DIAGNOSIS — I872 Venous insufficiency (chronic) (peripheral): Secondary | ICD-10-CM

## 2011-07-01 DIAGNOSIS — R0600 Dyspnea, unspecified: Secondary | ICD-10-CM

## 2011-07-01 NOTE — Progress Notes (Signed)
HPI:  66 yo female with history of HTN, DM, hyperlipidemia, obesity, diastolic dysfunction and chronic dyspnea which has gotten worse over the last couple of years.  Borderline sleep apnea 2-3 years ago but she can not tolerate CPAP because she is claustrophobic.    She had a pulmonary evaluation with Dr. Vassie Loll but no primary lung problems were identified. Patient had an ETT-myoview which was negative for ischemia or infarction. Right heart cath at that time showed mildly elevated right heart filling pressures but normal PA pressure and normal PCWP.  Echo in 11/12 with normal EF. Diastolic dysfunction.  No significant LV mid-cavity gradient was described. V/Q scan was negative for PE.   03/2011: Hemodynamics (mmHg)  RA mean 11  RV 41/13  PA 34/17, mean 24  PCWP mean 15  Oxygen saturations:  PA 63%  AO 91%  Cardiac Output (Fick) 6  Cardiac Index (Fick) 2.9   She is here to discuss her heart failure with daughter-in-law, Designer, television/film set at [employer]).  She is feeling ok today.  Over the last week she took extra demadex for a couple of days and lost 13 pounds.  She usually sleeps in a recliner but has been able to have a couple of nights where she slept in the bed.  She does not weigh at home because she doesn't want to see the number.  She feels her severe arthritis in back and knee is a limiting factor.  Appetite has changed a lot over the last year and not eating a lot.    Ambulated in halls during clinic today and O2 stayed above 96%.   ROS: All systems negative except as listed in HPI, PMH and Problem List.  Past Medical History  Diagnosis Date  . Type II or unspecified type diabetes mellitus without mention of complication, not stated as uncontrolled     insulin dep  . Hyperlipidemia     hx rhabdo on statins  . Hypertension   . CHF (congestive heart failure)     diastolic  . OSA (obstructive sleep apnea)     insignificant apnea events 05/2009 sleep study  . Chronic diarrhea    a/w nausea - felt related to IBS  . RLS (restless legs syndrome)   . Osteoarthritis   . Stasis dermatitis     Current Outpatient Prescriptions  Medication Sig Dispense Refill  . aspirin 81 MG tablet Take 81 mg by mouth daily.       . carvedilol (COREG) 25 MG tablet Take 25 mg by mouth 2 (two) times daily with a meal.        . Coenzyme Q10 (COQ10) 30 MG CAPS Take 1 capsule by mouth daily.       . Cyanocobalamin (VITAMIN B-12 PO) Take 1 tablet by mouth daily.      . fenofibrate 160 MG tablet TAKE 1 TABLET BY MOUTH EVERY DAY  30 tablet  5  . flyticasone (CUTIVATE) 0.005 % ointment Apply topically 3 (three) times daily as needed. For itching  60 g  2  . glucose blood (FREESTYLE LITE) test strip 1 each by Other route 2 (two) times daily. And lancets 2/day, 250.01  100 each  11  . hydrALAZINE (APRESOLINE) 50 MG tablet Take 1 tablet (50 mg total) by mouth 3 (three) times daily.  90 tablet  6  . insulin detemir (LEVEMIR FLEXPEN) 100 UNIT/ML injection Inject 50 Units into the skin at bedtime.      . insulin lispro (HUMALOG KWIKPEN) 100  UNIT/ML injection Inject 35-40 Units into the skin 3 (three) times daily before meals. 35 with breakfast, 35 with lunch, and 40 units with dinner      . omega-3 acid ethyl esters (LOVAZA) 1 G capsule Take 2 capsules (2 g total) by mouth 2 (two) times daily.      . potassium chloride (KLOR-CON 10) 10 MEQ tablet Take 3 tablets in the morning and 2 tablets in the evening  150 tablet  6  . rOPINIRole (REQUIP) 2 MG tablet TAKE 2 TABLETS BY MOUTH EVERY DAY OR AS DIRECTED  60 tablet  3  . torsemide (DEMADEX) 20 MG tablet Take 5 tablets daily at the same time in the morning  150 tablet  3     PHYSICAL EXAM: Filed Vitals:   07/01/11 1052  BP: 138/66  Pulse: 80  Weight: 260 lb 8 oz (118.162 kg)  SpO2: 93%    General:  Obese, Well appearing. No resp difficulty HEENT: normal Neck: supple. JVP hard to assess . Carotids 2+ bilaterally; no bruits. No lymphadenopathy or  thryomegaly appreciated. Cor: PMI nonpalpable Regular rate & rhythm. No rubs, gallops or murmurs. Lungs: clear Abdomen: obese, soft, nontender, nondistended.Good bowel sounds. Extremities: no cyanosis, clubbing, rash, 2+ edema, venous stasis changes lower extremities.  +Skin break down.  Neuro: alert & orientedx3, cranial nerves grossly intact. Moves all 4 extremities w/o difficulty. Affect pleasant.    ASSESSMENT & PLAN:

## 2011-07-01 NOTE — Assessment & Plan Note (Signed)
Suspect she has severe venous stasis. Already on high-dose torsemide. Will refer to [facility] for UNNA boots.

## 2011-07-01 NOTE — Patient Instructions (Signed)
Will refer to pulmonary rehab.  Will schedule pulmonary function test.    Will get [facility] to wrap your legs.  Try the [facility] Diet for the next 6-8 weeks to lose 20-25 pounds.   Follow up 6-8 weeks with Dr. Gala Romney.

## 2011-07-01 NOTE — Assessment & Plan Note (Addendum)
I have reviewed her records and test results at length and discussed them with both her and her daughter-in-law. In reviewing her RHC, her wedge is normal but RA pressure is up suggest of subtle RV dysfunction likely due to a combination of diastolic dysfunction and R sided heart strain from her obesity. Fortunately she does not have significant PAH at this point. I agree with Dr. Shirlee Latch that the majority of her symptoms likely relate to her obesity and probable severe restrictive lung disease. She tells me she was 107 pounds when she got married and now is nearly 270 pounds at 5' tall. We reviewed her diet closely and she has already made significant changes for the better but has now become just too debilitated too move much. We discussed the absolute need to lose weight to avoid becoming wheelchair bound in the near future. I walked her down the hall and she was severely dyspneic with severe knee pain after only 100-200 feet. O2 sats stayed in mid 90s.I have recommended pulmonary rehab for her restrictive lung disease to help her get more active (Nu-Step) and have also suggested trying the Atkins program to facilitate some rapid weight loss. She also needs CPAP but just cannot tolerate.

## 2011-07-07 ENCOUNTER — Ambulatory Visit (HOSPITAL_COMMUNITY)
Admission: RE | Admit: 2011-07-07 | Discharge: 2011-07-07 | Disposition: A | Discharge status: Home / Self Care | Payer: Medicare Other | Source: Ambulatory Visit | Attending: Physician Assistant | Admitting: Physician Assistant

## 2011-07-07 DIAGNOSIS — R0989 Other specified symptoms and signs involving the circulatory and respiratory systems: Secondary | ICD-10-CM | POA: Insufficient documentation

## 2011-07-07 DIAGNOSIS — R0609 Other forms of dyspnea: Secondary | ICD-10-CM | POA: Insufficient documentation

## 2011-07-07 DIAGNOSIS — R0600 Dyspnea, unspecified: Secondary | ICD-10-CM

## 2011-07-07 MED ORDER — ALBUTEROL SULFATE (5 MG/ML) 0.5% IN NEBU
2.5000 mg | INHALATION_SOLUTION | Freq: Once | RESPIRATORY_TRACT | Status: AC
  Administered 2011-07-07: 2.5 mg via RESPIRATORY_TRACT

## 2011-07-08 ENCOUNTER — Encounter: Payer: Self-pay | Admitting: Internal Medicine

## 2011-07-08 ENCOUNTER — Ambulatory Visit (INDEPENDENT_AMBULATORY_CARE_PROVIDER_SITE_OTHER): Payer: Medicare Other | Admitting: Internal Medicine

## 2011-07-08 ENCOUNTER — Telehealth (HOSPITAL_COMMUNITY): Payer: Self-pay | Admitting: Vascular Surgery

## 2011-07-08 VITALS — BP 118/62 | HR 68 | Temp 98.1°F | Ht 62.0 in | Wt 262.2 lb

## 2011-07-08 DIAGNOSIS — L03119 Cellulitis of unspecified part of limb: Secondary | ICD-10-CM

## 2011-07-08 DIAGNOSIS — I872 Venous insufficiency (chronic) (peripheral): Secondary | ICD-10-CM

## 2011-07-08 DIAGNOSIS — I8310 Varicose veins of unspecified lower extremity with inflammation: Secondary | ICD-10-CM

## 2011-07-08 DIAGNOSIS — L97209 Non-pressure chronic ulcer of unspecified calf with unspecified severity: Secondary | ICD-10-CM | POA: Diagnosis not present

## 2011-07-08 DIAGNOSIS — R269 Unspecified abnormalities of gait and mobility: Secondary | ICD-10-CM | POA: Diagnosis not present

## 2011-07-08 DIAGNOSIS — I5032 Chronic diastolic (congestive) heart failure: Secondary | ICD-10-CM | POA: Diagnosis not present

## 2011-07-08 DIAGNOSIS — L02419 Cutaneous abscess of limb, unspecified: Secondary | ICD-10-CM

## 2011-07-08 DIAGNOSIS — E119 Type 2 diabetes mellitus without complications: Secondary | ICD-10-CM | POA: Diagnosis not present

## 2011-07-08 DIAGNOSIS — I509 Heart failure, unspecified: Secondary | ICD-10-CM | POA: Diagnosis not present

## 2011-07-08 MED ORDER — CEPHALEXIN 500 MG PO CAPS: 500.0000 mg | ORAL_CAPSULE | Freq: Four times a day (QID) | ORAL | Status: DC

## 2011-07-08 MED ORDER — HYDROCODONE-ACETAMINOPHEN 5-500 MG PO TABS: 1.0000 | ORAL_TABLET | Freq: Three times a day (TID) | ORAL | Status: AC | PRN

## 2011-07-08 NOTE — Progress Notes (Signed)
Subjective:    Patient ID: Melissa Myers, female    DOB: 18-Aug-1945, 66 y.o.   MRN: [id number]  HPI  complains of increase leg pain and redness Affects distal BLE associated with weeping and increase in purulent blisters  Also reviewed chronic medical issues: Rash distal lower extremities bilaterally. ?Stasis dermatitis -Onset Spring 2012, intermittent but gradually worsening. Unresolved with various courses of antibiotics or topical steroids. Associated with itching. No other rash on body. S/p local derm eval, pending opinion [facility] 08/2011 for same - ?[facility] sooner  DM2 -variable compliance with prescribed medications; intolerant of metformin due to GI side effects. Does not regularly check sugars. Returned to care of Dr. Sharl Ma due to complications/side effects with levemir  Low back pain, chronic. Worse with exertion, relieved with sitting or rest. Denies trauma or falls. No radiation of pain. No fever or urinary change. No relief with over-the-counter medications. Not improving with tamadol   Past Medical History  Diagnosis Date  . Type II or unspecified type diabetes mellitus without mention of complication, not stated as uncontrolled     insulin dep  . Hyperlipidemia     hx rhabdo on statins  . Hypertension   . CHF (congestive heart failure)     diastolic  . OSA (obstructive sleep apnea)     insignificant apnea events 05/2009 sleep study  . Chronic diarrhea     a/w nausea - felt related to IBS  . RLS (restless legs syndrome)   . Osteoarthritis   . Stasis dermatitis     Review of Systems  Constitutional: Positive for fatigue. Negative for fever.  Respiratory: Negative for cough and chest tightness.   Cardiovascular: Positive for leg swelling. Negative for chest pain.  Musculoskeletal: Positive for myalgias and back pain.       Objective:   Physical Exam BP 118/62  Pulse 68  Temp(Src) 98.1 F (36.7 C) (Oral)  Ht 5\' 2"  (1.575 m)  Wt 262 lb 3.2 oz (118.933 kg)   BMI 47.96 kg/m2  SpO2 91% Wt Readings from Last 3 Encounters:  07/08/11 262 lb 3.2 oz (118.933 kg)  07/01/11 260 lb 8 oz (118.162 kg)  05/11/11 249 lb 6.4 oz (113.127 kg)   Constitutional: She is overweight; appears well-developed and well-nourished. No distress. mom at side Neck: Thick; Normal range of motion. Neck supple. No JVD present. No thyromegaly present.  Cardiovascular: Normal rate, regular rhythm and normal heart sounds.  No murmur heard. chronic tight/woody 2+ BLE edema Pulmonary/Chest: Effort normal and breath sounds diminished at bases bilaterally. No respiratory distress. She has no wheezes.  Skin: chronic thickening - increase erythema of distal BLE, cellulitis atop stasis dermatitis - few pustules on anterior shin or weeping. Evidence for excoriation - also pustule-rash on L>R anterior surface of shins Psychiatric: She has a normal mood and affect. Her behavior is normal. Judgment and thought content normal.   Lab Results  Component Value Date   WBC 9.2 04/15/2011   HGB 11.5* 04/15/2011   HCT 37.0 04/15/2011   PLT 345 04/15/2011   GLUCOSE 159* 04/21/2011   CHOL 243* 02/28/2011   TRIG 281.0* 02/28/2011   HDL 53.10 02/28/2011   LDLDIRECT 120.9 02/28/2011   LDLCALC 115 12/02/2009   ALT 27 07/21/2010   AST 36 07/21/2010   NA 141 04/21/2011   K 3.3* 04/21/2011   CL 101 04/21/2011   CREATININE 1.2 04/21/2011   BUN 25* 04/21/2011   CO2 32 04/21/2011   TSH 1.35 05/29/2009  INR 1.00 04/15/2011   HGBA1C 9.0* 05/03/2010       Assessment & Plan:   Cellulitis BLE, complicated by venous insufficiency  Start keflex, Lortab for pain For HH unna boot per cards to help venous insuff Continue diuertic per cards

## 2011-07-08 NOTE — Patient Instructions (Signed)
It was good to see you today. Start Keflex antibiotics for leg swelling and redness - Use Lortab as needed for pain Your prescription(s) have been submitted to your pharmacy. Please take as directed and contact our office if you believe you are having problem(s) with the medication(s). Proceed with home leg wraps as ordered by cardiology and keep visit with dermatology as planned

## 2011-07-08 NOTE — Telephone Encounter (Signed)
Vernona Rieger from [facility] would like to recommend PT/OT for [facility].

## 2011-07-11 NOTE — Telephone Encounter (Signed)
Sent message to [facility] with [facility] that ok for PT/OT

## 2011-07-12 DIAGNOSIS — I5032 Chronic diastolic (congestive) heart failure: Secondary | ICD-10-CM | POA: Diagnosis not present

## 2011-07-12 DIAGNOSIS — I509 Heart failure, unspecified: Secondary | ICD-10-CM | POA: Diagnosis not present

## 2011-07-12 DIAGNOSIS — R269 Unspecified abnormalities of gait and mobility: Secondary | ICD-10-CM | POA: Diagnosis not present

## 2011-07-12 DIAGNOSIS — I872 Venous insufficiency (chronic) (peripheral): Secondary | ICD-10-CM | POA: Diagnosis not present

## 2011-07-12 DIAGNOSIS — L97209 Non-pressure chronic ulcer of unspecified calf with unspecified severity: Secondary | ICD-10-CM | POA: Diagnosis not present

## 2011-07-12 DIAGNOSIS — E119 Type 2 diabetes mellitus without complications: Secondary | ICD-10-CM | POA: Diagnosis not present

## 2011-07-14 DIAGNOSIS — R269 Unspecified abnormalities of gait and mobility: Secondary | ICD-10-CM | POA: Diagnosis not present

## 2011-07-14 DIAGNOSIS — I872 Venous insufficiency (chronic) (peripheral): Secondary | ICD-10-CM | POA: Diagnosis not present

## 2011-07-14 DIAGNOSIS — L97209 Non-pressure chronic ulcer of unspecified calf with unspecified severity: Secondary | ICD-10-CM | POA: Diagnosis not present

## 2011-07-14 DIAGNOSIS — E119 Type 2 diabetes mellitus without complications: Secondary | ICD-10-CM | POA: Diagnosis not present

## 2011-07-14 DIAGNOSIS — I5032 Chronic diastolic (congestive) heart failure: Secondary | ICD-10-CM | POA: Diagnosis not present

## 2011-07-14 DIAGNOSIS — I509 Heart failure, unspecified: Secondary | ICD-10-CM | POA: Diagnosis not present

## 2011-07-15 DIAGNOSIS — I509 Heart failure, unspecified: Secondary | ICD-10-CM | POA: Diagnosis not present

## 2011-07-15 DIAGNOSIS — L97209 Non-pressure chronic ulcer of unspecified calf with unspecified severity: Secondary | ICD-10-CM | POA: Diagnosis not present

## 2011-07-15 DIAGNOSIS — R269 Unspecified abnormalities of gait and mobility: Secondary | ICD-10-CM | POA: Diagnosis not present

## 2011-07-15 DIAGNOSIS — E119 Type 2 diabetes mellitus without complications: Secondary | ICD-10-CM | POA: Diagnosis not present

## 2011-07-15 DIAGNOSIS — I5032 Chronic diastolic (congestive) heart failure: Secondary | ICD-10-CM | POA: Diagnosis not present

## 2011-07-15 DIAGNOSIS — I872 Venous insufficiency (chronic) (peripheral): Secondary | ICD-10-CM | POA: Diagnosis not present

## 2011-07-18 DIAGNOSIS — E119 Type 2 diabetes mellitus without complications: Secondary | ICD-10-CM | POA: Diagnosis not present

## 2011-07-18 DIAGNOSIS — I872 Venous insufficiency (chronic) (peripheral): Secondary | ICD-10-CM | POA: Diagnosis not present

## 2011-07-18 DIAGNOSIS — I5032 Chronic diastolic (congestive) heart failure: Secondary | ICD-10-CM | POA: Diagnosis not present

## 2011-07-18 DIAGNOSIS — L97209 Non-pressure chronic ulcer of unspecified calf with unspecified severity: Secondary | ICD-10-CM | POA: Diagnosis not present

## 2011-07-18 DIAGNOSIS — R269 Unspecified abnormalities of gait and mobility: Secondary | ICD-10-CM | POA: Diagnosis not present

## 2011-07-18 DIAGNOSIS — I509 Heart failure, unspecified: Secondary | ICD-10-CM | POA: Diagnosis not present

## 2011-07-19 DIAGNOSIS — I509 Heart failure, unspecified: Secondary | ICD-10-CM | POA: Diagnosis not present

## 2011-07-19 DIAGNOSIS — R269 Unspecified abnormalities of gait and mobility: Secondary | ICD-10-CM | POA: Diagnosis not present

## 2011-07-19 DIAGNOSIS — I5032 Chronic diastolic (congestive) heart failure: Secondary | ICD-10-CM | POA: Diagnosis not present

## 2011-07-19 DIAGNOSIS — I872 Venous insufficiency (chronic) (peripheral): Secondary | ICD-10-CM | POA: Diagnosis not present

## 2011-07-19 DIAGNOSIS — L97209 Non-pressure chronic ulcer of unspecified calf with unspecified severity: Secondary | ICD-10-CM | POA: Diagnosis not present

## 2011-07-19 DIAGNOSIS — E119 Type 2 diabetes mellitus without complications: Secondary | ICD-10-CM | POA: Diagnosis not present

## 2011-07-20 DIAGNOSIS — I5032 Chronic diastolic (congestive) heart failure: Secondary | ICD-10-CM | POA: Diagnosis not present

## 2011-07-20 DIAGNOSIS — E119 Type 2 diabetes mellitus without complications: Secondary | ICD-10-CM | POA: Diagnosis not present

## 2011-07-20 DIAGNOSIS — R269 Unspecified abnormalities of gait and mobility: Secondary | ICD-10-CM | POA: Diagnosis not present

## 2011-07-20 DIAGNOSIS — I872 Venous insufficiency (chronic) (peripheral): Secondary | ICD-10-CM | POA: Diagnosis not present

## 2011-07-20 DIAGNOSIS — I509 Heart failure, unspecified: Secondary | ICD-10-CM | POA: Diagnosis not present

## 2011-07-20 DIAGNOSIS — L97209 Non-pressure chronic ulcer of unspecified calf with unspecified severity: Secondary | ICD-10-CM | POA: Diagnosis not present

## 2011-07-21 DIAGNOSIS — I509 Heart failure, unspecified: Secondary | ICD-10-CM | POA: Diagnosis not present

## 2011-07-21 DIAGNOSIS — L97209 Non-pressure chronic ulcer of unspecified calf with unspecified severity: Secondary | ICD-10-CM | POA: Diagnosis not present

## 2011-07-21 DIAGNOSIS — I5032 Chronic diastolic (congestive) heart failure: Secondary | ICD-10-CM | POA: Diagnosis not present

## 2011-07-21 DIAGNOSIS — I872 Venous insufficiency (chronic) (peripheral): Secondary | ICD-10-CM | POA: Diagnosis not present

## 2011-07-21 DIAGNOSIS — R269 Unspecified abnormalities of gait and mobility: Secondary | ICD-10-CM | POA: Diagnosis not present

## 2011-07-21 DIAGNOSIS — E119 Type 2 diabetes mellitus without complications: Secondary | ICD-10-CM | POA: Diagnosis not present

## 2011-07-22 ENCOUNTER — Telehealth: Payer: Self-pay | Admitting: Internal Medicine

## 2011-07-22 DIAGNOSIS — L97209 Non-pressure chronic ulcer of unspecified calf with unspecified severity: Secondary | ICD-10-CM | POA: Diagnosis not present

## 2011-07-22 DIAGNOSIS — I872 Venous insufficiency (chronic) (peripheral): Secondary | ICD-10-CM | POA: Diagnosis not present

## 2011-07-22 DIAGNOSIS — I5032 Chronic diastolic (congestive) heart failure: Secondary | ICD-10-CM | POA: Diagnosis not present

## 2011-07-22 DIAGNOSIS — E119 Type 2 diabetes mellitus without complications: Secondary | ICD-10-CM | POA: Diagnosis not present

## 2011-07-22 DIAGNOSIS — R269 Unspecified abnormalities of gait and mobility: Secondary | ICD-10-CM | POA: Diagnosis not present

## 2011-07-22 DIAGNOSIS — I509 Heart failure, unspecified: Secondary | ICD-10-CM | POA: Diagnosis not present

## 2011-07-22 MED ORDER — NYSTATIN 100000 UNIT/GM EX POWD: Freq: Four times a day (QID) | CUTANEOUS | Status: DC

## 2011-07-22 NOTE — Telephone Encounter (Signed)
[facility] called and is hoping to get an order for an antifungal cream.  The nurse states she has irritation under her skin folds.     Call back - [phone] Pt - [phone]

## 2011-07-22 NOTE — Telephone Encounter (Signed)
Melissa Myers [facility] states pt has yeast infection under her breast, vagina, folds of her abdomen, mouth and LT nipple. Pt is requesting antifungal medication, please advise.

## 2011-07-22 NOTE — Telephone Encounter (Signed)
Nystatin - erx done

## 2011-07-25 DIAGNOSIS — I5032 Chronic diastolic (congestive) heart failure: Secondary | ICD-10-CM | POA: Diagnosis not present

## 2011-07-25 DIAGNOSIS — E119 Type 2 diabetes mellitus without complications: Secondary | ICD-10-CM | POA: Diagnosis not present

## 2011-07-25 DIAGNOSIS — L97209 Non-pressure chronic ulcer of unspecified calf with unspecified severity: Secondary | ICD-10-CM | POA: Diagnosis not present

## 2011-07-25 DIAGNOSIS — I509 Heart failure, unspecified: Secondary | ICD-10-CM | POA: Diagnosis not present

## 2011-07-25 DIAGNOSIS — R269 Unspecified abnormalities of gait and mobility: Secondary | ICD-10-CM | POA: Diagnosis not present

## 2011-07-25 DIAGNOSIS — I872 Venous insufficiency (chronic) (peripheral): Secondary | ICD-10-CM | POA: Diagnosis not present

## 2011-07-25 NOTE — Telephone Encounter (Signed)
HHRN informed via secure VM. Left message on machine advising pt of same.

## 2011-07-27 DIAGNOSIS — I5032 Chronic diastolic (congestive) heart failure: Secondary | ICD-10-CM | POA: Diagnosis not present

## 2011-07-27 DIAGNOSIS — L97209 Non-pressure chronic ulcer of unspecified calf with unspecified severity: Secondary | ICD-10-CM | POA: Diagnosis not present

## 2011-07-27 DIAGNOSIS — E119 Type 2 diabetes mellitus without complications: Secondary | ICD-10-CM | POA: Diagnosis not present

## 2011-07-27 DIAGNOSIS — I509 Heart failure, unspecified: Secondary | ICD-10-CM | POA: Diagnosis not present

## 2011-07-27 DIAGNOSIS — I872 Venous insufficiency (chronic) (peripheral): Secondary | ICD-10-CM | POA: Diagnosis not present

## 2011-07-27 DIAGNOSIS — R269 Unspecified abnormalities of gait and mobility: Secondary | ICD-10-CM | POA: Diagnosis not present

## 2011-07-28 DIAGNOSIS — E119 Type 2 diabetes mellitus without complications: Secondary | ICD-10-CM | POA: Diagnosis not present

## 2011-07-28 DIAGNOSIS — L97209 Non-pressure chronic ulcer of unspecified calf with unspecified severity: Secondary | ICD-10-CM | POA: Diagnosis not present

## 2011-07-28 DIAGNOSIS — I509 Heart failure, unspecified: Secondary | ICD-10-CM | POA: Diagnosis not present

## 2011-07-28 DIAGNOSIS — R269 Unspecified abnormalities of gait and mobility: Secondary | ICD-10-CM | POA: Diagnosis not present

## 2011-07-28 DIAGNOSIS — I872 Venous insufficiency (chronic) (peripheral): Secondary | ICD-10-CM | POA: Diagnosis not present

## 2011-07-28 DIAGNOSIS — I5032 Chronic diastolic (congestive) heart failure: Secondary | ICD-10-CM | POA: Diagnosis not present

## 2011-07-29 ENCOUNTER — Telehealth (HOSPITAL_COMMUNITY): Payer: Self-pay | Admitting: *Deleted

## 2011-07-29 DIAGNOSIS — E119 Type 2 diabetes mellitus without complications: Secondary | ICD-10-CM | POA: Diagnosis not present

## 2011-07-29 DIAGNOSIS — I5032 Chronic diastolic (congestive) heart failure: Secondary | ICD-10-CM | POA: Diagnosis not present

## 2011-07-29 DIAGNOSIS — R269 Unspecified abnormalities of gait and mobility: Secondary | ICD-10-CM | POA: Diagnosis not present

## 2011-07-29 DIAGNOSIS — I509 Heart failure, unspecified: Secondary | ICD-10-CM | POA: Diagnosis not present

## 2011-07-29 DIAGNOSIS — L97209 Non-pressure chronic ulcer of unspecified calf with unspecified severity: Secondary | ICD-10-CM | POA: Diagnosis not present

## 2011-07-29 DIAGNOSIS — I872 Venous insufficiency (chronic) (peripheral): Secondary | ICD-10-CM | POA: Diagnosis not present

## 2011-07-29 NOTE — Telephone Encounter (Signed)
Melanie called today for Melissa Myers.  She believes she is having a reaction to the zinc, causing her legs to burn when she is wearing the uniboots and she wants to know if there is something different they can try for her. Please give her a call back. Thanks.

## 2011-08-01 DIAGNOSIS — E1149 Type 2 diabetes mellitus with other diabetic neurological complication: Secondary | ICD-10-CM | POA: Diagnosis not present

## 2011-08-01 DIAGNOSIS — I872 Venous insufficiency (chronic) (peripheral): Secondary | ICD-10-CM | POA: Diagnosis not present

## 2011-08-01 DIAGNOSIS — L97209 Non-pressure chronic ulcer of unspecified calf with unspecified severity: Secondary | ICD-10-CM | POA: Diagnosis not present

## 2011-08-01 DIAGNOSIS — I509 Heart failure, unspecified: Secondary | ICD-10-CM | POA: Diagnosis not present

## 2011-08-01 DIAGNOSIS — R269 Unspecified abnormalities of gait and mobility: Secondary | ICD-10-CM | POA: Diagnosis not present

## 2011-08-01 DIAGNOSIS — E119 Type 2 diabetes mellitus without complications: Secondary | ICD-10-CM | POA: Diagnosis not present

## 2011-08-01 DIAGNOSIS — I5032 Chronic diastolic (congestive) heart failure: Secondary | ICD-10-CM | POA: Diagnosis not present

## 2011-08-01 DIAGNOSIS — E1142 Type 2 diabetes mellitus with diabetic polyneuropathy: Secondary | ICD-10-CM | POA: Diagnosis not present

## 2011-08-01 NOTE — Telephone Encounter (Signed)
Ok to stop [employer] and try profore dressing. Change weekly. Provided call back number for questions.

## 2011-08-02 DIAGNOSIS — L97209 Non-pressure chronic ulcer of unspecified calf with unspecified severity: Secondary | ICD-10-CM | POA: Diagnosis not present

## 2011-08-02 DIAGNOSIS — I509 Heart failure, unspecified: Secondary | ICD-10-CM | POA: Diagnosis not present

## 2011-08-02 DIAGNOSIS — E119 Type 2 diabetes mellitus without complications: Secondary | ICD-10-CM | POA: Diagnosis not present

## 2011-08-02 DIAGNOSIS — I5032 Chronic diastolic (congestive) heart failure: Secondary | ICD-10-CM | POA: Diagnosis not present

## 2011-08-02 DIAGNOSIS — R269 Unspecified abnormalities of gait and mobility: Secondary | ICD-10-CM | POA: Diagnosis not present

## 2011-08-02 DIAGNOSIS — I872 Venous insufficiency (chronic) (peripheral): Secondary | ICD-10-CM | POA: Diagnosis not present

## 2011-08-03 DIAGNOSIS — E119 Type 2 diabetes mellitus without complications: Secondary | ICD-10-CM | POA: Diagnosis not present

## 2011-08-03 DIAGNOSIS — I509 Heart failure, unspecified: Secondary | ICD-10-CM | POA: Diagnosis not present

## 2011-08-03 DIAGNOSIS — I872 Venous insufficiency (chronic) (peripheral): Secondary | ICD-10-CM | POA: Diagnosis not present

## 2011-08-03 DIAGNOSIS — L97209 Non-pressure chronic ulcer of unspecified calf with unspecified severity: Secondary | ICD-10-CM | POA: Diagnosis not present

## 2011-08-03 DIAGNOSIS — I5032 Chronic diastolic (congestive) heart failure: Secondary | ICD-10-CM | POA: Diagnosis not present

## 2011-08-03 DIAGNOSIS — R269 Unspecified abnormalities of gait and mobility: Secondary | ICD-10-CM | POA: Diagnosis not present

## 2011-08-04 ENCOUNTER — Other Ambulatory Visit: Payer: Self-pay | Admitting: Internal Medicine

## 2011-08-04 DIAGNOSIS — I872 Venous insufficiency (chronic) (peripheral): Secondary | ICD-10-CM | POA: Diagnosis not present

## 2011-08-04 DIAGNOSIS — I509 Heart failure, unspecified: Secondary | ICD-10-CM | POA: Diagnosis not present

## 2011-08-04 DIAGNOSIS — L97209 Non-pressure chronic ulcer of unspecified calf with unspecified severity: Secondary | ICD-10-CM | POA: Diagnosis not present

## 2011-08-04 DIAGNOSIS — R269 Unspecified abnormalities of gait and mobility: Secondary | ICD-10-CM | POA: Diagnosis not present

## 2011-08-04 DIAGNOSIS — I5032 Chronic diastolic (congestive) heart failure: Secondary | ICD-10-CM | POA: Diagnosis not present

## 2011-08-04 DIAGNOSIS — E119 Type 2 diabetes mellitus without complications: Secondary | ICD-10-CM | POA: Diagnosis not present

## 2011-08-08 ENCOUNTER — Ambulatory Visit (HOSPITAL_COMMUNITY)
Admission: RE | Admit: 2011-08-08 | Discharge: 2011-08-08 | Disposition: A | Discharge status: Home / Self Care | Payer: Medicare Other | Source: Ambulatory Visit | Attending: Internal Medicine | Admitting: Internal Medicine

## 2011-08-08 ENCOUNTER — Encounter (HOSPITAL_COMMUNITY): Payer: Self-pay

## 2011-08-08 VITALS — BP 150/60 | HR 66 | Ht 60.0 in | Wt 250.8 lb

## 2011-08-08 DIAGNOSIS — I5032 Chronic diastolic (congestive) heart failure: Secondary | ICD-10-CM | POA: Diagnosis not present

## 2011-08-08 DIAGNOSIS — E785 Hyperlipidemia, unspecified: Secondary | ICD-10-CM | POA: Diagnosis not present

## 2011-08-08 DIAGNOSIS — I10 Essential (primary) hypertension: Secondary | ICD-10-CM | POA: Diagnosis not present

## 2011-08-08 DIAGNOSIS — R0989 Other specified symptoms and signs involving the circulatory and respiratory systems: Secondary | ICD-10-CM | POA: Diagnosis not present

## 2011-08-08 DIAGNOSIS — R0609 Other forms of dyspnea: Secondary | ICD-10-CM | POA: Diagnosis not present

## 2011-08-08 NOTE — Patient Instructions (Addendum)
Will stop leg wraps.  Start wearing compression hose during the day, remove during the night.  Await call from pulmonary rehab.  Follow up with Dr. Gala Romney in 2 months.

## 2011-08-10 DIAGNOSIS — I509 Heart failure, unspecified: Secondary | ICD-10-CM | POA: Diagnosis not present

## 2011-08-10 DIAGNOSIS — L97209 Non-pressure chronic ulcer of unspecified calf with unspecified severity: Secondary | ICD-10-CM | POA: Diagnosis not present

## 2011-08-10 DIAGNOSIS — I872 Venous insufficiency (chronic) (peripheral): Secondary | ICD-10-CM | POA: Diagnosis not present

## 2011-08-10 DIAGNOSIS — E119 Type 2 diabetes mellitus without complications: Secondary | ICD-10-CM | POA: Diagnosis not present

## 2011-08-10 DIAGNOSIS — I5032 Chronic diastolic (congestive) heart failure: Secondary | ICD-10-CM | POA: Diagnosis not present

## 2011-08-10 DIAGNOSIS — R269 Unspecified abnormalities of gait and mobility: Secondary | ICD-10-CM | POA: Diagnosis not present

## 2011-08-11 ENCOUNTER — Other Ambulatory Visit: Payer: Self-pay | Admitting: Cardiology

## 2011-08-11 NOTE — Telephone Encounter (Signed)
Refilled carvedilol.

## 2011-08-12 DIAGNOSIS — L97209 Non-pressure chronic ulcer of unspecified calf with unspecified severity: Secondary | ICD-10-CM | POA: Diagnosis not present

## 2011-08-12 DIAGNOSIS — I872 Venous insufficiency (chronic) (peripheral): Secondary | ICD-10-CM | POA: Diagnosis not present

## 2011-08-12 DIAGNOSIS — I509 Heart failure, unspecified: Secondary | ICD-10-CM | POA: Diagnosis not present

## 2011-08-12 DIAGNOSIS — I5032 Chronic diastolic (congestive) heart failure: Secondary | ICD-10-CM | POA: Diagnosis not present

## 2011-08-12 DIAGNOSIS — E119 Type 2 diabetes mellitus without complications: Secondary | ICD-10-CM | POA: Diagnosis not present

## 2011-08-12 DIAGNOSIS — R269 Unspecified abnormalities of gait and mobility: Secondary | ICD-10-CM | POA: Diagnosis not present

## 2011-08-15 ENCOUNTER — Telehealth (HOSPITAL_COMMUNITY): Payer: Self-pay | Admitting: *Deleted

## 2011-08-15 ENCOUNTER — Other Ambulatory Visit: Payer: Self-pay

## 2011-08-15 DIAGNOSIS — I5032 Chronic diastolic (congestive) heart failure: Secondary | ICD-10-CM

## 2011-08-15 DIAGNOSIS — R0602 Shortness of breath: Secondary | ICD-10-CM

## 2011-08-15 MED ORDER — TORSEMIDE 20 MG PO TABS: ORAL_TABLET | ORAL | Status: DC

## 2011-08-15 MED ORDER — ROPINIROLE HCL 2 MG PO TABS: 2.0000 mg | ORAL_TABLET | Freq: Every day | ORAL | Status: DC

## 2011-08-15 NOTE — Telephone Encounter (Signed)
Pt aware rx was sent in 

## 2011-08-15 NOTE — Telephone Encounter (Signed)
Ms Drumgoole called. She needs a refill called in for her torsemide to [facility]. Thanks.

## 2011-08-16 DIAGNOSIS — E119 Type 2 diabetes mellitus without complications: Secondary | ICD-10-CM | POA: Diagnosis not present

## 2011-08-16 DIAGNOSIS — L97209 Non-pressure chronic ulcer of unspecified calf with unspecified severity: Secondary | ICD-10-CM | POA: Diagnosis not present

## 2011-08-16 DIAGNOSIS — I5032 Chronic diastolic (congestive) heart failure: Secondary | ICD-10-CM | POA: Diagnosis not present

## 2011-08-16 DIAGNOSIS — I509 Heart failure, unspecified: Secondary | ICD-10-CM | POA: Diagnosis not present

## 2011-08-16 DIAGNOSIS — I872 Venous insufficiency (chronic) (peripheral): Secondary | ICD-10-CM | POA: Diagnosis not present

## 2011-08-16 DIAGNOSIS — R269 Unspecified abnormalities of gait and mobility: Secondary | ICD-10-CM | POA: Diagnosis not present

## 2011-08-19 ENCOUNTER — Ambulatory Visit (INDEPENDENT_AMBULATORY_CARE_PROVIDER_SITE_OTHER): Payer: Medicare Other | Admitting: Internal Medicine

## 2011-08-19 ENCOUNTER — Encounter: Payer: Self-pay | Admitting: Internal Medicine

## 2011-08-19 ENCOUNTER — Other Ambulatory Visit (INDEPENDENT_AMBULATORY_CARE_PROVIDER_SITE_OTHER): Payer: Medicare Other

## 2011-08-19 VITALS — BP 142/70 | HR 86 | Temp 97.6°F | Resp 18 | Wt 262.0 lb

## 2011-08-19 DIAGNOSIS — R319 Hematuria, unspecified: Secondary | ICD-10-CM | POA: Diagnosis not present

## 2011-08-19 DIAGNOSIS — I872 Venous insufficiency (chronic) (peripheral): Secondary | ICD-10-CM

## 2011-08-19 DIAGNOSIS — I8310 Varicose veins of unspecified lower extremity with inflammation: Secondary | ICD-10-CM | POA: Diagnosis not present

## 2011-08-19 DIAGNOSIS — I5032 Chronic diastolic (congestive) heart failure: Secondary | ICD-10-CM

## 2011-08-19 LAB — URINALYSIS, ROUTINE W REFLEX MICROSCOPIC
Hgb urine dipstick: NEGATIVE
Ketones, ur: NEGATIVE
Specific Gravity, Urine: 1.01 (ref 1.000–1.030)
Total Protein, Urine: NEGATIVE
Urine Glucose: NEGATIVE

## 2011-08-19 MED ORDER — CEPHALEXIN 500 MG PO CAPS: 500.0000 mg | ORAL_CAPSULE | Freq: Four times a day (QID) | ORAL | Status: DC

## 2011-08-19 NOTE — Patient Instructions (Signed)
It was good to see you today. Start Keflex antibiotics for possible bladder infection- Test(s) ordered today. Your results will be called to you after review (48-72hours after test completion). If any changes need to be made, you will be notified at that time. If no infection and continued bleeding, will need to see Dr Hyacinth Meeker (gynecology) for evaluation of possible vaginal bleeding/"periods" continue home leg wraps as ordered by cardiology and keep visit with dermatology as planned in July

## 2011-08-19 NOTE — Assessment & Plan Note (Signed)
Complicated by severe venous stasis and lymphedema Continue diuretic and follow up [facility] 08/2011 as planned [facility] for una boots untl then

## 2011-08-19 NOTE — Assessment & Plan Note (Signed)
Following with cardiology for same, volume status complicated by renal insufficiency and chronic venous insufficiency Remains on torsemide diuretics (more effective than furosemide) but noncompliance (and wt gain) in past week due to hematuria - see above Cards changed amlodipine to hydralazine s/p RHC 04/06/11 Per cardiology, also on Coreg to decrease of the mid cavity gradient Medications and interval hx reviewed, reminded of importance of diuretic and other med compliance

## 2011-08-19 NOTE — Progress Notes (Signed)
Subjective:    Patient ID: Melissa Myers, female    DOB: [birth year], 66 y.o.   MRN: [id number]  HPI  complains of blood in toilet, ?hematuria or other source Ongoing intermittent last 2 weeks Denies BRBPR/ melena, ?period-like sometimes associated with suprapubic cramping -  Episodes associated with urination, not bowels Episodes of same with prior UTI  Also reviewed chronic medical issues: Rash distal lower extremities bilaterally. ?Stasis dermatitis -Onset Spring 2012, intermittent but gradually worsening. Unresolved with various courses of antibiotics or topical steroids. Associated with itching. No other rash on body. S/p local derm eval, pending opinion [facility] 08/2011 for same   DM2 -variable compliance with prescribed medications; intolerant of metformin due to GI side effects. Does not regularly check sugars. Returned to care of Dr. Sharl Ma due to complications/side effects with levemir  Low back pain, chronic. Worse with exertion, relieved with sitting or rest. Denies trauma or falls. No radiation of pain. No fever or urinary change. No relief with over-the-counter medications. Not improving with tamadol   Past Medical History  Diagnosis Date  . Type II or unspecified type diabetes mellitus without mention of complication, not stated as uncontrolled     insulin dep  . Hyperlipidemia     hx rhabdo on statins  . Hypertension   . CHF (congestive heart failure)     diastolic  . OSA (obstructive sleep apnea)     insignificant apnea events 05/2009 sleep study  . Chronic diarrhea     a/w nausea - felt related to IBS  . RLS (restless legs syndrome)   . Osteoarthritis   . Stasis dermatitis     Review of Systems  Constitutional: Positive for fatigue. Negative for fever.  Respiratory: Positive for shortness of breath. Negative for cough and chest tightness.   Cardiovascular: Positive for leg swelling. Negative for chest pain.  Musculoskeletal: Positive for myalgias and back  pain.       Objective:   Physical Exam  BP 142/70  Pulse 86  Temp 97.6 F (36.4 C) (Oral)  Resp 18  Wt 262 lb (118.842 kg) Wt Readings from Last 3 Encounters:  08/19/11 262 lb (118.842 kg)  08/08/11 250 lb 12.8 oz (113.762 kg)  07/08/11 262 lb 3.2 oz (118.933 kg)   Constitutional: She is overweight; appears well-developed and well-nourished. No distress. mom at side Neck: Thick; Normal range of motion. Neck supple. No JVD present. No thyromegaly present.  Cardiovascular: Normal rate, regular rhythm and normal heart sounds.  No murmur heard. chronic tight/woody 2+ BLE edema Pulmonary/Chest: Effort normal and breath sounds diminished at bases bilaterally. No respiratory distress. She has no wheezes.  Skin: chronic thickening and mild erythema of distal BLE consistent with stasis dermatitis - no pustules on anterior shin or weeping. Evidence for excoriation -  Psychiatric: She has a normal mood and affect. Her behavior is normal. Judgment and thought content normal.   Lab Results  Component Value Date   WBC 9.2 04/15/2011   HGB 11.5* 04/15/2011   HCT 37.0 04/15/2011   PLT 345 04/15/2011   GLUCOSE 159* 04/21/2011   CHOL 243* 02/28/2011   TRIG 281.0* 02/28/2011   HDL 53.10 02/28/2011   LDLDIRECT 120.9 02/28/2011   LDLCALC 115 12/02/2009   ALT 27 07/21/2010   AST 36 07/21/2010   NA 141 04/21/2011   K 3.3* 04/21/2011   CL 101 04/21/2011   CREATININE 1.2 04/21/2011   BUN 25* 04/21/2011   CO2 32 04/21/2011   TSH 1.35 05/29/2009  INR 1.00 04/15/2011   HGBA1C 9.0* 05/03/2010       Assessment & Plan:   Hematuria, ?UTI per hx  Check UA/Ucx Start keflex Gyn eval for poss PMP bleeding if no UTI and persisting symptoms

## 2011-08-21 LAB — URINE CULTURE: Organism ID, Bacteria: NO GROWTH

## 2011-08-23 DIAGNOSIS — I5032 Chronic diastolic (congestive) heart failure: Secondary | ICD-10-CM | POA: Diagnosis not present

## 2011-08-23 DIAGNOSIS — E119 Type 2 diabetes mellitus without complications: Secondary | ICD-10-CM | POA: Diagnosis not present

## 2011-08-23 DIAGNOSIS — R269 Unspecified abnormalities of gait and mobility: Secondary | ICD-10-CM | POA: Diagnosis not present

## 2011-08-23 DIAGNOSIS — L97209 Non-pressure chronic ulcer of unspecified calf with unspecified severity: Secondary | ICD-10-CM | POA: Diagnosis not present

## 2011-08-23 DIAGNOSIS — I872 Venous insufficiency (chronic) (peripheral): Secondary | ICD-10-CM | POA: Diagnosis not present

## 2011-08-23 DIAGNOSIS — I509 Heart failure, unspecified: Secondary | ICD-10-CM | POA: Diagnosis not present

## 2011-08-28 NOTE — Assessment & Plan Note (Signed)
Improved. Congratulated her on her efforts to watch her dietary intake and lose weight. UNNA boots not working for her - will switch to compression hose. Reinforced need for daily weights and reviewed use of sliding scale diuretics.

## 2011-08-28 NOTE — Progress Notes (Signed)
HPI:  66 yo female with history of HTN, DM, hyperlipidemia, obesity, diastolic dysfunction and chronic dyspnea which has gotten worse over the last couple of years.  Borderline sleep apnea 2-3 years ago but she can not tolerate CPAP because she is claustrophobic.    She had a pulmonary evaluation with Dr. Vassie Loll but no primary lung problems were identified. Patient had an ETT-myoview which was negative for ischemia or infarction. Right heart cath at that time showed mildly elevated right heart filling pressures but normal PA pressure and normal PCWP.  Echo in 11/12 with normal EF. Diastolic dysfunction.  No significant LV mid-cavity gradient was described. V/Q scan was negative for PE.   03/2011: Hemodynamics (mmHg)  RA mean 11  RV 41/13  PA 34/17, mean 24  PCWP mean 15  Oxygen saturations:  PA 63%  AO 91%  Cardiac Output (Fick) 6  Cardiac Index (Fick) 2.9   Her daughter-in-law is Engineer, manufacturing Scientist, research (physical sciences) at [employer]).    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 FVC 1.61 (58 % pred) FEV1 1.33 (62% pred) FEV1/FVC 82 (106 %) FEF 25-75% 1.39 (72% pred) TLC 3.18 (69% pred) DLCO 69%  She returns for follow up.  She feels well. She has been following the Adkin's Diet, she is down 12 pounds on our scales.  She finished with PT/OT.  She has started walking without cane/walker.  She is having her legs wrapped but these aren't staying up.  Continues to have arthritis in back but this has improved in her knees.  Has not heard from pulmonary rehab.  ROS: All systems negative except as listed in HPI, PMH and Problem List.  Past Medical History  Diagnosis Date  . Type II or unspecified type diabetes mellitus without mention of complication, not stated as uncontrolled     insulin dep  . Hyperlipidemia     hx rhabdo on statins  . Hypertension   . CHF (congestive heart failure)     diastolic  . OSA (obstructive sleep apnea)     insignificant apnea events  05/2009 sleep study  . Chronic diarrhea     a/w nausea - felt related to IBS  . RLS (restless legs syndrome)   . Osteoarthritis   . Stasis dermatitis     Current Outpatient Prescriptions  Medication Sig Dispense Refill  . aspirin 81 MG tablet Take 81 mg by mouth daily.       . cholecalciferol (VITAMIN D) 400 UNITS TABS Take 400 Units by mouth daily.      . Coenzyme Q10 (COQ10) 30 MG CAPS Take 1 capsule by mouth daily.       . Cyanocobalamin (VITAMIN B-12 PO) Take 1 tablet by mouth daily.      . fenofibrate 160 MG tablet TAKE 1 TABLET BY MOUTH EVERY DAY  30 tablet  5  . Garlic 1000 MG CAPS Take 1 capsule by mouth daily.      [name] glucose blood (FREESTYLE LITE) test strip 1 each by Other route 2 (two) times daily. And lancets 2/day, 250.01  100 each  11  . hydrALAZINE (APRESOLINE) 50 MG tablet Take 1 tablet (50 mg total) by mouth 3 (three) times daily.  90 tablet  6  . insulin detemir (LEVEMIR FLEXPEN) 100 UNIT/ML injection Inject 60 Units into the skin at bedtime.       . insulin lispro (HUMALOG KWIKPEN) 100 UNIT/ML injection Inject 35-40 Units into the skin 3 (three) times daily before meals. 35 with  breakfast, 35 with lunch, and 40 units with dinner      . nystatin (MYCOSTATIN) powder Apply topically 4 (four) times daily.  60 g  0  . omega-3 acid ethyl esters (LOVAZA) 1 G capsule Take 2 capsules (2 g total) by mouth 2 (two) times daily.      . potassium chloride (KLOR-CON 10) 10 MEQ tablet Take 3 tablets in the morning and 2 tablets in the evening  150 tablet  6  . carvedilol (COREG) 25 MG tablet TAKE 1 TABLET BY MOUTH TWICE DAILY  180 tablet  0  . cephALEXin (KEFLEX) 500 MG capsule Take 1 capsule (500 mg total) by mouth 4 (four) times daily.  40 capsule  0  . flyticasone (CUTIVATE) 0.005 % ointment Apply topically 3 (three) times daily as needed. For itching  60 g  2  . rOPINIRole (REQUIP) 2 MG tablet Take 1 tablet (2 mg total) by mouth at bedtime.  30 tablet  2  . torsemide (DEMADEX) 20 MG  tablet Take 5 tablets daily at the same time in the morning  150 tablet  6  . DISCONTD: insulin lispro (HUMALOG KWIKPEN) 100 UNIT/ML injection Inject 35 Units into the skin 3 (three) times daily before meals.  45 mL  12     PHYSICAL EXAM: Filed Vitals:   08/08/11 0939  BP: 150/60  Pulse: 66  Height: 5' (1.524 m)  Weight: 250 lb 12.8 oz (113.762 kg)  SpO2: 96%    General:  Obese, Well appearing. No resp difficulty HEENT: normal Neck: supple. JVP hard to assess . Carotids 2+ bilaterally; no bruits. No lymphadenopathy or thryomegaly appreciated. Cor: PMI nonpalpable Regular rate & rhythm. No rubs, gallops or murmurs. Lungs: clear Abdomen: obese, soft, nontender, nondistended.Good bowel sounds. Extremities: no cyanosis, clubbing, rash, 1+ edema, + venous stasis changes lower extremities.  Skin break down clearing. Neuro: alert & orientedx3, cranial nerves grossly intact. Moves all 4 extremities w/o difficulty. Affect pleasant.    ASSESSMENT & PLAN:

## 2011-08-28 NOTE — Assessment & Plan Note (Signed)
Multifactorial. Hopefully will improve with weight loss. Given exertional hypoxemia will refer to pulmonary rehab.

## 2011-09-07 ENCOUNTER — Inpatient Hospital Stay (HOSPITAL_COMMUNITY): Admission: RE | Admit: 2011-09-07 | Payer: Medicare Other | Source: Ambulatory Visit

## 2011-09-08 ENCOUNTER — Encounter (HOSPITAL_COMMUNITY): Payer: Medicare Other

## 2011-09-12 DIAGNOSIS — I872 Venous insufficiency (chronic) (peripheral): Secondary | ICD-10-CM | POA: Diagnosis not present

## 2011-09-12 DIAGNOSIS — I5030 Unspecified diastolic (congestive) heart failure: Secondary | ICD-10-CM | POA: Diagnosis not present

## 2011-09-12 DIAGNOSIS — Z79899 Other long term (current) drug therapy: Secondary | ICD-10-CM | POA: Diagnosis not present

## 2011-09-12 DIAGNOSIS — I890 Lymphedema, not elsewhere classified: Secondary | ICD-10-CM | POA: Diagnosis not present

## 2011-09-12 DIAGNOSIS — I8310 Varicose veins of unspecified lower extremity with inflammation: Secondary | ICD-10-CM | POA: Diagnosis not present

## 2011-09-13 ENCOUNTER — Encounter (HOSPITAL_COMMUNITY): Payer: Medicare Other

## 2011-09-15 ENCOUNTER — Encounter (HOSPITAL_COMMUNITY): Payer: Medicare Other

## 2011-09-20 ENCOUNTER — Encounter (HOSPITAL_COMMUNITY): Payer: Medicare Other

## 2011-09-20 ENCOUNTER — Other Ambulatory Visit: Payer: Self-pay

## 2011-09-20 NOTE — Telephone Encounter (Signed)
Pt called stating that she has been taking Requip 2 mg 2 tabs qhs for several years. Recent Rx is for 2 mg 1 tab qhs and pt thinks this is an error. There is no documentation of decrease Per EPIC, please advise.

## 2011-09-21 MED ORDER — ROPINIROLE HCL 2 MG PO TABS: 4.0000 mg | ORAL_TABLET | Freq: Every day | ORAL | Status: DC

## 2011-09-21 NOTE — Telephone Encounter (Signed)
Ok to change requip to 2mg  tabs 2tabs qhs

## 2011-09-22 ENCOUNTER — Encounter (HOSPITAL_COMMUNITY): Payer: Medicare Other

## 2011-09-27 ENCOUNTER — Encounter (HOSPITAL_COMMUNITY): Payer: Medicare Other

## 2011-09-29 ENCOUNTER — Encounter (HOSPITAL_COMMUNITY): Payer: Medicare Other

## 2011-10-04 ENCOUNTER — Encounter (HOSPITAL_COMMUNITY): Payer: Medicare Other

## 2011-10-05 ENCOUNTER — Other Ambulatory Visit (INDEPENDENT_AMBULATORY_CARE_PROVIDER_SITE_OTHER): Payer: Medicare Other

## 2011-10-05 ENCOUNTER — Ambulatory Visit (INDEPENDENT_AMBULATORY_CARE_PROVIDER_SITE_OTHER): Payer: Medicare Other | Admitting: Internal Medicine

## 2011-10-05 ENCOUNTER — Encounter: Payer: Self-pay | Admitting: Internal Medicine

## 2011-10-05 VITALS — BP 112/62 | HR 61 | Temp 97.8°F | Ht 62.0 in | Wt 260.4 lb

## 2011-10-05 DIAGNOSIS — N058 Unspecified nephritic syndrome with other morphologic changes: Secondary | ICD-10-CM

## 2011-10-05 DIAGNOSIS — E1029 Type 1 diabetes mellitus with other diabetic kidney complication: Secondary | ICD-10-CM

## 2011-10-05 DIAGNOSIS — I5032 Chronic diastolic (congestive) heart failure: Secondary | ICD-10-CM | POA: Diagnosis not present

## 2011-10-05 DIAGNOSIS — I8310 Varicose veins of unspecified lower extremity with inflammation: Secondary | ICD-10-CM

## 2011-10-05 DIAGNOSIS — E1065 Type 1 diabetes mellitus with hyperglycemia: Secondary | ICD-10-CM | POA: Diagnosis not present

## 2011-10-05 DIAGNOSIS — I872 Venous insufficiency (chronic) (peripheral): Secondary | ICD-10-CM

## 2011-10-05 LAB — BASIC METABOLIC PANEL
Calcium: 9.4 mg/dL (ref 8.4–10.5)
Creatinine, Ser: 0.9 mg/dL (ref 0.4–1.2)

## 2011-10-05 LAB — HEMOGLOBIN A1C: Hgb A1c MFr Bld: 8.7 % — ABNORMAL HIGH (ref 4.6–6.5)

## 2011-10-05 MED ORDER — INSULIN GLARGINE 100 UNIT/ML ~~LOC~~ SOLN: 60.0000 [IU] | Freq: Every day | SUBCUTANEOUS | Status: DC

## 2011-10-05 MED ORDER — METOLAZONE 2.5 MG PO TABS: 2.5000 mg | ORAL_TABLET | ORAL | Status: DC

## 2011-10-05 MED ORDER — GABAPENTIN 300 MG PO CAPS: 300.0000 mg | ORAL_CAPSULE | Freq: Every day | ORAL | Status: DC

## 2011-10-05 MED ORDER — ROPINIROLE HCL 2 MG PO TABS: 2.0000 mg | ORAL_TABLET | Freq: Two times a day (BID) | ORAL | Status: DC

## 2011-10-05 NOTE — Progress Notes (Signed)
Subjective:    Patient ID: Melissa Myers, female    DOB: Apr 03, 1945, 66 y.o.   MRN: [id number]  HPI  here for follow up - reviewed chronic medical issues:  Venous stasis dermatitis - severe distal BLE - eval at [facility] summer 2013 confirmed same - Onset Spring 2012, intermittent but gradually worsening. Unresolved with various courses of antibiotics or topical steroids. Associated with itching. No other rash on body. S/p local derm eval in2012 and 2nd opinion [facility] 08/2011 = same diagnosis  Dyspnea on exertion. Unchanged and chronic; Worse with increased weight gain and when laying flat. Not improved with increased diuretics. CT negative for PE on January 03, 2011 in ER, neg RHC 03/2011 - planning ETT myoview with cards  Insomnia - manifest as restless legs for which she takes Requip, but little relief Falls asleep during daytime, 2 events while driving Feels "smothered" when laying supine Prior sleep study 2011 "did not show apnea" but followup evaluation December 2012 feels sleep apnea highly probable based on body habitus - unable to tol home CPAP split study  DM2 -variable compliance with prescribed medications; intolerant of metformin due to GI side effects. Does not regularly check sugars. Returned to care of Dr. Sharl Ma due to complications/side effects with levemir  Low back pain, chronic. Worse with exertion, relieved with sitting or rest. Denies trauma or falls. No radiation of pain. No fever or urinary change. No relief with over-the-counter medications. Improving with Ultracet and PT  Past Medical History  Diagnosis Date  . Type II or unspecified type diabetes mellitus without mention of complication, not stated as uncontrolled     insulin dep  . Hyperlipidemia     hx rhabdo on statins  . Hypertension   . CHF (congestive heart failure)     diastolic  . OSA (obstructive sleep apnea)     insignificant apnea events 05/2009 sleep study  . Chronic diarrhea     a/w nausea - felt  related to IBS  . RLS (restless legs syndrome)   . Osteoarthritis   . Stasis dermatitis      Review of Systems  Constitutional: Positive for fatigue. Negative for fever and unexpected weight change.  Respiratory: Positive for shortness of breath (chronic). Negative for cough, chest tightness and wheezing.   Cardiovascular: Negative for chest pain and palpitations. Leg swelling: chronic.  Skin: Positive for rash. Negative for wound.       Objective:   Physical Exam  BP 112/62  Pulse 61  Temp 97.8 F (36.6 C) (Oral)  Ht 5\' 2"  (1.575 m)  Wt 260 lb 6.4 oz (118.117 kg)  BMI 47.63 kg/m2  SpO2 95% Wt Readings from Last 3 Encounters:  10/05/11 260 lb 6.4 oz (118.117 kg)  08/19/11 262 lb (118.842 kg)  08/08/11 250 lb 12.8 oz (113.762 kg)   Constitutional: She is overweight; appears well-developed and well-nourished. No distress. mom at side Neck: Thick; Normal range of motion. Neck supple. No JVD present. No thyromegaly present.  Cardiovascular: Normal rate, regular rhythm and normal heart sounds.  No murmur heard. chronic tight/woody 2+ BLE edema Pulmonary/Chest: Effort normal and breath sounds diminished at bases bilaterally. No respiratory distress. She has no wheezes.  Skin: chronic thickening and mild erythema of distal BLE, stasis dermatitis without change - few pustules on anterior shin or weeping. Evidence for excoriation - Psychiatric: She has a dysthymic mood and affect. Her behavior is normal. Judgment and thought content normal.       Lab Results  Component Value Date   WBC 9.2 04/15/2011   HGB 11.5* 04/15/2011   HCT 37.0 04/15/2011   PLT 345 04/15/2011   CHOL 243* 02/28/2011   TRIG 281.0* 02/28/2011   HDL 53.10 02/28/2011   LDLDIRECT 120.9 02/28/2011   ALT 27 07/21/2010   AST 36 07/21/2010   NA 141 04/21/2011   K 3.3* 04/21/2011   CL 101 04/21/2011   CREATININE 1.2 04/21/2011   BUN 25* 04/21/2011   CO2 32 04/21/2011   TSH 1.35 05/29/2009   INR 1.00 04/15/2011   HGBA1C 9.0*  05/03/2010     Assessment & Plan:  See problem list. Medications and labs reviewed today.  Time spent with pt/family today 25 minutes, greater than 50% time spent counseling patient on diabetes, CHF, dermatitis, OSA and medication review. Also review of interval records/tests/labs with pt

## 2011-10-05 NOTE — Patient Instructions (Signed)
It was good to see you today. We have reviewed your recent medical visits, weights and tests/medications - change Levemir to Lantus, start zaroxolyn every other day for 2 weeks for fluid and refill gabapentin Other Medications reviewed, no other changes at this time. Test(s) ordered today. Your results will be called to you after review (48-72hours after test completion). If any changes need to be made, you will be notified at that time. we'll make referral to [facility] for diabetes mellitus. Our office will contact you regarding appointment(s) once made. Please schedule followup in 4 months, call sooner if problems.

## 2011-10-05 NOTE — Assessment & Plan Note (Signed)
Complicated by severe venous stasis and lymphedema Continue diuretic - add zaroxyln now and follow up with CHF cards (see next) Dx confirmed from [facility] derm 2nd opinion 08/2011 Declines additional HHRN for una boots at this time

## 2011-10-05 NOTE — Assessment & Plan Note (Signed)
Following with cardiology for same, volume status complicated by renal insufficiency and chronic venous insufficiency Remains on torsemide diuretics (more effective than furosemide) and will add qod zaroxolyn x 2 weeks- see above Cards changed amlodipine to hydralazine s/p RHC 04/06/11 Per cardiology, also on Coreg to decrease of the mid cavity gradient Medications and interval hx reviewed, reminded of importance of diuretic and other med compliance Using compressing hose in place of UNNA boot wraps at this time

## 2011-10-05 NOTE — Assessment & Plan Note (Signed)
Changed endo from [facility] to [place] for new opinion, previosuly with [facility] Now requests refer to [facility] - now on TID humulin  Change Levemir to Lantus  Lab Results  Component Value Date   HGBA1C 9.0* 05/03/2010

## 2011-10-06 ENCOUNTER — Encounter (HOSPITAL_COMMUNITY): Payer: Medicare Other

## 2011-10-11 ENCOUNTER — Encounter (HOSPITAL_COMMUNITY): Payer: Medicare Other

## 2011-10-13 ENCOUNTER — Encounter (HOSPITAL_COMMUNITY): Payer: Medicare Other

## 2011-10-18 ENCOUNTER — Encounter (HOSPITAL_COMMUNITY): Payer: Medicare Other

## 2011-10-20 ENCOUNTER — Encounter (HOSPITAL_COMMUNITY): Payer: Medicare Other

## 2011-10-21 ENCOUNTER — Ambulatory Visit (INDEPENDENT_AMBULATORY_CARE_PROVIDER_SITE_OTHER): Payer: Medicare Other | Admitting: Internal Medicine

## 2011-10-21 ENCOUNTER — Encounter: Payer: Self-pay | Admitting: Internal Medicine

## 2011-10-21 VITALS — BP 130/62 | HR 73 | Temp 98.6°F | Ht 62.0 in | Wt 250.0 lb

## 2011-10-21 DIAGNOSIS — M545 Low back pain: Secondary | ICD-10-CM | POA: Diagnosis not present

## 2011-10-21 DIAGNOSIS — I872 Venous insufficiency (chronic) (peripheral): Secondary | ICD-10-CM

## 2011-10-21 DIAGNOSIS — I8310 Varicose veins of unspecified lower extremity with inflammation: Secondary | ICD-10-CM

## 2011-10-21 DIAGNOSIS — I5032 Chronic diastolic (congestive) heart failure: Secondary | ICD-10-CM

## 2011-10-21 MED ORDER — TRAMADOL HCL 50 MG PO TABS: 50.0000 mg | ORAL_TABLET | Freq: Three times a day (TID) | ORAL | Status: AC | PRN

## 2011-10-21 NOTE — Assessment & Plan Note (Signed)
Following with cardiology for same, volume status complicated by renal insufficiency and chronic venous insufficiency Remain on torsemide diuretics (more effective than furosemide) and 2 weeks qod zaroxolyn - see above Cards changed amlodipine to hydralazine s/p RHC 04/06/11 Per cardiology, also on Coreg to decrease of the mid cavity gradient Medications and interval hx reviewed, reminded of importance of diuretic and other med compliance Using compressing hose in place of UNNA boot wraps at this time

## 2011-10-21 NOTE — Progress Notes (Signed)
Subjective:    Patient ID: Melissa Myers, female    DOB: 09-13-1945, 66 y.o.   MRN: [id number]  Back Pain Pertinent negatives include no chest pain or fever.   also reviewed chronic medical issues:  Venous stasis dermatitis - severe distal BLE - eval at [facility] summer 2013 confirmed same - Onset Spring 2012, intermittent but gradually worsening. Unresolved with various courses of antibiotics or topical steroids. Associated with itching. No other rash on body. S/p local derm eval in 2012 and 2nd opinion [facility] 08/2011 = same diagnosis - improved swelling on zaroxyln in addition to demadex (2 weeks tx mid 09/2011)  Dyspnea on exertion. Unchanged and chronic; Worse with increased weight gain and when laying flat. Not improved with increased diuretics. CT negative for PE on January 03, 2011 in ER, neg [facility] 03/2011 -   Insomnia - manifest as restless legs for which she takes Requip, but little relief Falls asleep during daytime, 2 events while driving Feels "smothered" when laying supine Prior sleep study 2011 "did not show apnea" but followup evaluation December 2012 feels sleep apnea highly probable based on body habitus - unable to tol home CPAP split study  DM2 -variable compliance with prescribed medications; intolerant of metformin due to GI side effects. Does not regularly check sugars. No longer following with endo Sharl Ma) due to complications/side effects with levemir, on Lantus since 09/2011 and pending [facility] endo eval  Low back pain, chronic. Worse with exertion, relieved with sitting or rest. Denies trauma or falls. No radiation of pain. No fever or urinary change. No relief with over-the-counter medications.  Past Medical History  Diagnosis Date  . Type II or unspecified type diabetes mellitus without mention of complication, not stated as uncontrolled     insulin dep  . Hyperlipidemia     hx rhabdo on statins  . Hypertension   . CHF (congestive heart failure)     diastolic  . OSA  (obstructive sleep apnea)     insignificant apnea events 05/2009 sleep study  . Chronic diarrhea     a/w nausea - felt related to IBS  . RLS (restless legs syndrome)   . Osteoarthritis   . Stasis dermatitis      Review of Systems  Constitutional: Positive for fatigue. Negative for fever and unexpected weight change.  Respiratory: Positive for shortness of breath (chronic). Negative for cough, chest tightness and wheezing.   Cardiovascular: Negative for chest pain and palpitations. Leg swelling: chronic.  Musculoskeletal: Positive for back pain.  Skin: Positive for rash. Negative for wound.       Objective:   Physical Exam  BP 130/62  Pulse 73  Temp 98.6 F (37 C) (Oral)  Ht 5\' 2"  (1.575 m)  Wt 250 lb (113.399 kg)  BMI 45.73 kg/m2  SpO2 96% Wt Readings from Last 3 Encounters:  10/21/11 250 lb (113.399 kg)  10/05/11 260 lb 6.4 oz (118.117 kg)  08/19/11 262 lb (118.842 kg)   Constitutional: She is overweight; appears well-developed and well-nourished. No distress. mom at side Neck: Thick; Normal range of motion. Neck supple. No JVD present. No thyromegaly present.  Cardiovascular: Normal rate, regular rhythm and normal heart sounds.  No murmur heard. improvement in chronic tight/woody 1+ BLE edema Pulmonary/Chest: Effort normal and breath sounds diminished at bases bilaterally. No respiratory distress. She has no wheezes.  MSkel: Back: full range of motion of thoracic and lumbar spine. Non tender to palpation. Negative straight leg raise. DTR's are symmetrically intact. Sensation intact  in all dermatomes of the lower extremities. Full strength to manual muscle testing. patient is able to heel toe walk without difficulty and ambulates with antalgic gait. Skin: chronic thickening and mild erythema of distal BLE, stasis dermatitis without change - few pustules on anterior shin or weeping. Evidence for excoriation - Psychiatric: She has a dysthymic mood and affect. Her behavior is  normal. Judgment and thought content normal.       Lab Results  Component Value Date   WBC 9.2 04/15/2011   HGB 11.5* 04/15/2011   HCT 37.0 04/15/2011   PLT 345 04/15/2011   CHOL 243* 02/28/2011   TRIG 281.0* 02/28/2011   HDL 53.10 02/28/2011   LDLDIRECT 120.9 02/28/2011   ALT 27 07/21/2010   AST 36 07/21/2010   NA 140 10/05/2011   K 5.0 10/05/2011   CL 98 10/05/2011   CREATININE 0.9 10/05/2011   BUN 27* 10/05/2011   CO2 33* 10/05/2011   TSH 1.35 05/29/2009   INR 1.00 04/15/2011   HGBA1C 8.7* 10/05/2011   MRI Lspine 05/2010: IMPRESSION: No change from the  prior MRI.  Mild spinal stenosis at L4-5 due to disc and facet degeneration. Moderate left foraminal encroachment L5-S1 is unchanged.   Assessment & Plan:  See problem list. Medications and labs reviewed today.

## 2011-10-21 NOTE — Assessment & Plan Note (Signed)
Complicated by severe venous stasis and lymphedema - improved with 2 weeks zaroxyln Continue diuretic - added zaroxyln only x 2 weeks - follow up with CHF cards (see next) Dx confirmed from [facility] derm 2nd opinion 08/2011 Declines additional HHRN for una boots at this time

## 2011-10-21 NOTE — Patient Instructions (Signed)
It was good to see you today. We have reviewed your recent medical visits, weights and tests/medications - continue Lantus, stop zaroxolyn after completion of the 2 weeks for fluid  Use tramadol and gabapentin for pain Your prescription(s) have been submitted to your pharmacy. Please take as directed and contact our office if you believe you are having problem(s) with the medication(s). Other Medications reviewed, no other changes at this time. Please keep scheduled followup, call sooner if problems.

## 2011-10-21 NOTE — Assessment & Plan Note (Addendum)
No neuro deficits on exam -  Chronic DDD 05/2010 and facet mediated pain add tramadol, continue gabapentin -prn low dose hydrocodone - avoiding NSAIDs due to lymphedema Renew PT as able Also recommended follow up with gyn

## 2011-10-22 ENCOUNTER — Other Ambulatory Visit: Payer: Self-pay | Admitting: Cardiology

## 2011-10-25 ENCOUNTER — Encounter (HOSPITAL_COMMUNITY): Payer: Medicare Other

## 2011-10-26 DIAGNOSIS — N898 Other specified noninflammatory disorders of vagina: Secondary | ICD-10-CM | POA: Diagnosis not present

## 2011-10-26 DIAGNOSIS — N950 Postmenopausal bleeding: Secondary | ICD-10-CM | POA: Diagnosis not present

## 2011-10-26 DIAGNOSIS — B373 Candidiasis of vulva and vagina: Secondary | ICD-10-CM | POA: Diagnosis not present

## 2011-10-27 ENCOUNTER — Encounter (HOSPITAL_COMMUNITY): Payer: Self-pay

## 2011-10-27 ENCOUNTER — Ambulatory Visit (HOSPITAL_COMMUNITY)
Admission: RE | Admit: 2011-10-27 | Discharge: 2011-10-27 | Disposition: A | Discharge status: Home / Self Care | Payer: Medicare Other | Source: Ambulatory Visit | Attending: Internal Medicine | Admitting: Internal Medicine

## 2011-10-27 ENCOUNTER — Encounter (HOSPITAL_COMMUNITY): Payer: Medicare Other

## 2011-10-27 VITALS — BP 144/82 | HR 71 | Resp 18 | Ht 61.0 in | Wt 260.0 lb

## 2011-10-27 DIAGNOSIS — R0602 Shortness of breath: Secondary | ICD-10-CM | POA: Diagnosis not present

## 2011-10-27 DIAGNOSIS — I5032 Chronic diastolic (congestive) heart failure: Secondary | ICD-10-CM

## 2011-10-27 DIAGNOSIS — M7989 Other specified soft tissue disorders: Secondary | ICD-10-CM | POA: Diagnosis not present

## 2011-10-27 LAB — BASIC METABOLIC PANEL
CO2: 34 mEq/L — ABNORMAL HIGH (ref 19–32)
GFR calc non Af Amer: 68 mL/min — ABNORMAL LOW (ref >90)
Glucose, Bld: 108 mg/dL — ABNORMAL HIGH (ref 70–99)
Potassium: 3.5 mEq/L (ref 3.5–5.1)
Sodium: 141 mEq/L (ref 135–145)

## 2011-10-27 NOTE — Patient Instructions (Addendum)
Follow up in 3 months

## 2011-10-27 NOTE — Progress Notes (Signed)
Patient ID: Melissa Myers, female   DOB: [birth year], 66 y.o.   MRN: [id number] PCP: Dr Felicity Coyer Pulmonologist: Dr Vassie Loll   HPI: 66 yo female with history of HTN, DM, hyperlipidemia, obesity, diastolic dysfunction and chronic dyspnea which has gotten worse over the last couple of years.  Borderline sleep apnea 2-3 years ago but she can not tolerate CPAP because she is claustrophobic.    She had a pulmonary evaluation with Dr. Vassie Loll but no primary lung problems were identified. Patient had an ETT-myoview which was negative for ischemia or infarction. Right heart cath at that time showed mildly elevated right heart filling pressures but normal PA pressure and normal PCWP.  Echo in 11/12 with normal EF. Diastolic dysfunction.  No significant LV mid-cavity gradient was described. V/Q scan was negative for PE.   03/2011: Hemodynamics (mmHg)  RA mean 11  RV 41/13  PA 34/17, mean 24  PCWP mean 15  Oxygen saturations:  PA 63%  AO 91%  Cardiac Output (Fick) 6  Cardiac Index (Fick) 2.9   Her daughter-in-law is Engineer, manufacturing Scientist, research (physical sciences) at [employer]).    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 FVC 1.61 (58 % pred) FEV1 1.33 (62% pred) FEV1/FVC 82 (106 %) FEF 25-75% 1.39 (72% pred) TLC 3.18 (69% pred) DLCO 69%  She returns for follow up with her Mom. Feels ok. Mild dyspnea when ambulating. Denies PND/Orthopnea. Weight at home 250-260 pounds. Dr Felicity Coyer ordered Metolazone 2.5 mg every other day for 2 weeks. Ambulates with cane. Waiting to start pulmonary rehab. Compliant with medications. She has not taken medication today. Does not tolerate CPAP. Drinking > 2 liters per day.     ROS: All systems negative except as listed in HPI, PMH and Problem List.  Past Medical History  Diagnosis Date  . Type II or unspecified type diabetes mellitus without mention of complication, not stated as uncontrolled     insulin dep  . Hyperlipidemia     hx rhabdo on statins    . Hypertension   . CHF (congestive heart failure)     diastolic  . OSA (obstructive sleep apnea)     insignificant apnea events 05/2009 sleep study  . Chronic diarrhea     a/w nausea - felt related to IBS  . RLS (restless legs syndrome)   . Osteoarthritis   . Stasis dermatitis     Current Outpatient Prescriptions  Medication Sig Dispense Refill  . aspirin 81 MG tablet Take 81 mg by mouth daily.       . carvedilol (COREG) 25 MG tablet TAKE 1 TABLET BY MOUTH TWICE DAILY  180 tablet  0  . cholecalciferol (VITAMIN D) 400 UNITS TABS Take 400 Units by mouth daily.      . Coenzyme Q10 (COQ10) 30 MG CAPS Take 1 capsule by mouth daily.       . Cyanocobalamin (VITAMIN B-12 PO) Take 1 tablet by mouth daily.      . fenofibrate 160 MG tablet TAKE 1 TABLET BY MOUTH EVERY DAY  30 tablet  5  . gabapentin (NEURONTIN) 300 MG capsule Take 1 capsule (300 mg total) by mouth at bedtime.  30 capsule  2  . Garlic 1000 MG CAPS Take 1 capsule by mouth daily.      [name] glucose blood (FREESTYLE LITE) test strip 1 each by Other route 2 (two) times daily. And lancets 2/day, 250.01  100 each  11  . hydrALAZINE (APRESOLINE) 50 MG tablet Take 1 tablet (  50 mg total) by mouth 3 (three) times daily.  90 tablet  6  . insulin glargine (LANTUS) 100 UNIT/ML injection Inject 60 Units into the skin at bedtime.  10 mL  12  . insulin lispro (HUMALOG KWIKPEN) 100 UNIT/ML injection Inject 35-40 Units into the skin 3 (three) times daily before meals. 35 with breakfast, 35 with lunch, and 40 units with dinner      . LOVAZA 1 G capsule TAKE 2 CAPSULES BY MOUTH TWICE DAILY  120 capsule  5  . LOVAZA 1 G capsule TAKE 2 CAPSULES BY MOUTH TWICE DAILY  120 capsule  0  . nystatin (MYCOSTATIN) powder Apply topically 4 (four) times daily.  60 g  0  . potassium chloride (KLOR-CON 10) 10 MEQ tablet Take 3 tablets in the morning and 2 tablets in the evening  150 tablet  6  . rOPINIRole (REQUIP) 2 MG tablet Take 1 tablet (2 mg total) by mouth 2  (two) times daily.  60 tablet  2  . torsemide (DEMADEX) 20 MG tablet Take 5 tablets daily at the same time in the morning  150 tablet  6  . flyticasone (CUTIVATE) 0.005 % ointment Apply topically 3 (three) times daily as needed. For itching  60 g  2  . metolazone (ZAROXOLYN) 2.5 MG tablet Take 1 tablet (2.5 mg total) by mouth every other day. X 2 weeks, then as directed  30 tablet  1  . traMADol (ULTRAM) 50 MG tablet Take 1 tablet (50 mg total) by mouth every 8 (eight) hours as needed for pain.  30 tablet  0  . DISCONTD: insulin lispro (HUMALOG KWIKPEN) 100 UNIT/ML injection Inject 35 Units into the skin 3 (three) times daily before meals.  45 mL  12  . DISCONTD: omega-3 acid ethyl esters (LOVAZA) 1 G capsule Take 2 capsules (2 g total) by mouth 2 (two) times daily.         PHYSICAL EXAM: Filed Vitals:   10/27/11 1447  BP: 144/82  Pulse: 71  Resp: 18  Height: 5\' 1"  (1.549 m)  Weight: 260 lb (117.935 kg)  SpO2: 97%    General:  Obese, Well appearing. No resp difficulty Mom present HEENT: normal Neck: supple. JVP hard to assess . Carotids 2+ bilaterally; no bruits. No lymphadenopathy or thryomegaly appreciated. Cor: PMI nonpalpable Regular rate & rhythm. No rubs, gallops or murmurs. Lungs: clear Abdomen: obese, soft, nontender, nondistended.Good bowel sounds. Extremities: no cyanosis, clubbing, rash, R and LLE 1+ edema Neuro: alert & orientedx3, cranial nerves grossly intact. Moves all 4 extremities w/o difficulty. Affect pleasant.    ASSESSMENT & PLAN:

## 2011-10-27 NOTE — Assessment & Plan Note (Signed)
Volume status stable despite 10 pound weight gain. Continue current diuretic regimen. Reinforced limiting fluid intake to < 2 liters per week and daily weights. Check BMET today. Follow up in 3 months.

## 2011-11-01 ENCOUNTER — Encounter (HOSPITAL_COMMUNITY): Payer: Medicare Other

## 2011-11-01 DIAGNOSIS — D251 Intramural leiomyoma of uterus: Secondary | ICD-10-CM | POA: Diagnosis not present

## 2011-11-01 DIAGNOSIS — N83339 Acquired atrophy of ovary and fallopian tube, unspecified side: Secondary | ICD-10-CM | POA: Diagnosis not present

## 2011-11-01 DIAGNOSIS — D259 Leiomyoma of uterus, unspecified: Secondary | ICD-10-CM | POA: Diagnosis not present

## 2011-11-01 DIAGNOSIS — N950 Postmenopausal bleeding: Secondary | ICD-10-CM | POA: Diagnosis not present

## 2011-11-01 DIAGNOSIS — N924 Excessive bleeding in the premenopausal period: Secondary | ICD-10-CM | POA: Diagnosis not present

## 2011-11-03 ENCOUNTER — Encounter (HOSPITAL_COMMUNITY): Payer: Medicare Other

## 2011-11-08 ENCOUNTER — Encounter: Payer: Self-pay | Admitting: Internal Medicine

## 2011-11-08 ENCOUNTER — Encounter (HOSPITAL_COMMUNITY): Payer: Medicare Other

## 2011-11-10 ENCOUNTER — Encounter (HOSPITAL_COMMUNITY): Payer: Medicare Other

## 2011-11-14 ENCOUNTER — Telehealth: Payer: Self-pay

## 2011-11-14 DIAGNOSIS — N950 Postmenopausal bleeding: Secondary | ICD-10-CM

## 2011-11-14 NOTE — Telephone Encounter (Signed)
Pt called requesting a referral to GYN.

## 2011-11-14 NOTE — Telephone Encounter (Signed)
Ok done

## 2011-11-15 ENCOUNTER — Encounter (HOSPITAL_COMMUNITY): Payer: Medicare Other

## 2011-11-17 ENCOUNTER — Encounter (HOSPITAL_COMMUNITY): Payer: Medicare Other

## 2011-11-22 ENCOUNTER — Encounter (HOSPITAL_COMMUNITY): Payer: Medicare Other

## 2011-11-24 ENCOUNTER — Other Ambulatory Visit: Payer: Self-pay | Admitting: Internal Medicine

## 2011-11-24 ENCOUNTER — Encounter (HOSPITAL_COMMUNITY): Payer: Medicare Other

## 2011-11-29 ENCOUNTER — Encounter (HOSPITAL_COMMUNITY): Payer: Medicare Other

## 2011-12-01 ENCOUNTER — Encounter (HOSPITAL_COMMUNITY): Payer: Medicare Other

## 2011-12-06 ENCOUNTER — Encounter (HOSPITAL_COMMUNITY): Payer: Medicare Other

## 2011-12-07 ENCOUNTER — Other Ambulatory Visit: Payer: Self-pay

## 2011-12-07 MED ORDER — ROPINIROLE HCL 2 MG PO TABS: 2.0000 mg | ORAL_TABLET | Freq: Two times a day (BID) | ORAL | Status: DC

## 2011-12-08 ENCOUNTER — Encounter (HOSPITAL_COMMUNITY): Payer: Medicare Other

## 2011-12-08 DIAGNOSIS — E119 Type 2 diabetes mellitus without complications: Secondary | ICD-10-CM | POA: Diagnosis not present

## 2011-12-08 DIAGNOSIS — E8881 Metabolic syndrome: Secondary | ICD-10-CM | POA: Diagnosis not present

## 2011-12-08 DIAGNOSIS — R0602 Shortness of breath: Secondary | ICD-10-CM | POA: Diagnosis not present

## 2011-12-08 DIAGNOSIS — I872 Venous insufficiency (chronic) (peripheral): Secondary | ICD-10-CM | POA: Diagnosis not present

## 2011-12-08 DIAGNOSIS — I5030 Unspecified diastolic (congestive) heart failure: Secondary | ICD-10-CM | POA: Diagnosis not present

## 2011-12-08 DIAGNOSIS — R635 Abnormal weight gain: Secondary | ICD-10-CM | POA: Diagnosis not present

## 2011-12-08 DIAGNOSIS — R7309 Other abnormal glucose: Secondary | ICD-10-CM | POA: Diagnosis not present

## 2011-12-13 ENCOUNTER — Encounter (HOSPITAL_COMMUNITY): Payer: Medicare Other

## 2011-12-15 ENCOUNTER — Encounter (HOSPITAL_COMMUNITY): Payer: Self-pay | Admitting: Emergency Medicine

## 2011-12-15 ENCOUNTER — Encounter (HOSPITAL_COMMUNITY): Payer: Medicare Other

## 2011-12-15 ENCOUNTER — Telehealth: Payer: Self-pay | Admitting: Internal Medicine

## 2011-12-15 ENCOUNTER — Observation Stay (HOSPITAL_COMMUNITY)
Admission: EM | Admit: 2011-12-15 | Discharge: 2011-12-15 | Disposition: A | Discharge status: Home / Self Care | Payer: Medicare Other | Attending: Emergency Medicine | Admitting: Emergency Medicine

## 2011-12-15 ENCOUNTER — Other Ambulatory Visit: Payer: Self-pay | Admitting: *Deleted

## 2011-12-15 DIAGNOSIS — G4733 Obstructive sleep apnea (adult) (pediatric): Secondary | ICD-10-CM | POA: Insufficient documentation

## 2011-12-15 DIAGNOSIS — R6889 Other general symptoms and signs: Secondary | ICD-10-CM | POA: Diagnosis not present

## 2011-12-15 DIAGNOSIS — E119 Type 2 diabetes mellitus without complications: Secondary | ICD-10-CM | POA: Insufficient documentation

## 2011-12-15 DIAGNOSIS — M7989 Other specified soft tissue disorders: Secondary | ICD-10-CM | POA: Diagnosis not present

## 2011-12-15 DIAGNOSIS — Z79899 Other long term (current) drug therapy: Secondary | ICD-10-CM | POA: Diagnosis not present

## 2011-12-15 DIAGNOSIS — Z7982 Long term (current) use of aspirin: Secondary | ICD-10-CM | POA: Insufficient documentation

## 2011-12-15 DIAGNOSIS — I872 Venous insufficiency (chronic) (peripheral): Secondary | ICD-10-CM | POA: Diagnosis not present

## 2011-12-15 DIAGNOSIS — I8310 Varicose veins of unspecified lower extremity with inflammation: Secondary | ICD-10-CM | POA: Diagnosis not present

## 2011-12-15 DIAGNOSIS — Z794 Long term (current) use of insulin: Secondary | ICD-10-CM | POA: Insufficient documentation

## 2011-12-15 DIAGNOSIS — M79609 Pain in unspecified limb: Secondary | ICD-10-CM | POA: Diagnosis not present

## 2011-12-15 DIAGNOSIS — E785 Hyperlipidemia, unspecified: Secondary | ICD-10-CM | POA: Insufficient documentation

## 2011-12-15 LAB — BASIC METABOLIC PANEL
Chloride: 96 mEq/L (ref 96–112)
GFR calc Af Amer: 81 mL/min — ABNORMAL LOW (ref >90)
GFR calc non Af Amer: 70 mL/min — ABNORMAL LOW (ref >90)
Potassium: 4.1 mEq/L (ref 3.5–5.1)
Sodium: 138 mEq/L (ref 135–145)

## 2011-12-15 LAB — CBC WITH DIFFERENTIAL/PLATELET
Basophils Relative: 0 % (ref 0–1)
Eosinophils Absolute: 0.2 10*3/uL (ref 0.0–0.7)
Lymphs Abs: 2.2 10*3/uL (ref 0.7–4.0)
MCH: 24.2 pg — ABNORMAL LOW (ref 26.0–34.0)
MCHC: 31.1 g/dL (ref 30.0–36.0)
Neutro Abs: 5.7 10*3/uL (ref 1.7–7.7)
Neutrophils Relative %: 65 % (ref 43–77)
Platelets: 369 10*3/uL (ref 150–400)
RBC: 4.33 MIL/uL (ref 3.87–5.11)

## 2011-12-15 MED ORDER — OXYCODONE-ACETAMINOPHEN 5-325 MG PO TABS: 2.0000 | ORAL_TABLET | ORAL | Status: DC | PRN

## 2011-12-15 MED ORDER — POTASSIUM CHLORIDE ER 10 MEQ PO TBCR: EXTENDED_RELEASE_TABLET | ORAL | Status: DC

## 2011-12-15 MED ORDER — FUROSEMIDE 10 MG/ML IJ SOLN
40.0000 mg | Freq: Once | INTRAMUSCULAR | Status: AC
  Administered 2011-12-15: 40 mg via INTRAVENOUS
  Filled 2011-12-15: qty 4

## 2011-12-15 MED ORDER — HYDRALAZINE HCL 50 MG PO TABS: 50.0000 mg | ORAL_TABLET | Freq: Three times a day (TID) | ORAL | Status: DC

## 2011-12-15 MED ORDER — SODIUM CHLORIDE 0.9 % IV SOLN: Freq: Once | INTRAVENOUS | Status: DC

## 2011-12-15 MED ORDER — OXYCODONE-ACETAMINOPHEN 5-325 MG PO TABS
2.0000 | ORAL_TABLET | Freq: Once | ORAL | Status: AC
  Administered 2011-12-15: 2 via ORAL
  Filled 2011-12-15: qty 2

## 2011-12-15 NOTE — Telephone Encounter (Signed)
Patient has been evaluated at [facility] long emergency room in the interval since this call first arrived - she has been prescribed Percocet by the ER provider, she may followup here in the next 1-2 weeks as needed

## 2011-12-15 NOTE — ED Notes (Signed)
[id number]> Expected date:<BR> Expected time:<BR> Means of arrival:<BR> Comments:<BR> Skin rash/pain

## 2011-12-15 NOTE — Telephone Encounter (Signed)
Caller: Hasana/Patient; Patient Name: Melissa Myers; PCP: Rene Paci (Adults only); Best Callback Phone Number: [phone] Afebrile. 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 She reports having about a 100 areas on her legs that range in size. They look like white craters and are oozing clear fluid but it looks yellowish when she wipes it per pt. Emergent s/s of Skin Lesion protocol r/o. Pt to see provider within 4hrs. Offered pt 11:30am appointment but she declines. She states she can not get dressed to come into office, she does not want anything to touch areas. She is requesting that Dr. Felicity Coyer call something in for discomfort. Pt reports she did have a follow up appointment at [facility] last Thursday. Message sent. Please Call. Pharmacy is [facility] [address]

## 2011-12-15 NOTE — ED Notes (Signed)
Per EMS- pt has skin disorder stasis dermatitis with pain associated. Pt c/o of increased pain 10/10 that started yesterday. NAD at this time.

## 2011-12-15 NOTE — ED Provider Notes (Addendum)
History     CSN: [id number]  Arrival date & time 12/15/11  4098   First MD Initiated Contact with Patient 12/15/11 1000      Chief Complaint  Patient presents with  . Leg Swelling  . Leg Pain    (Consider location/radiation/quality/duration/timing/severity/associated sxs/prior treatment) HPI Comments: Pt has a history of chronic venous stasis ulcers. She states that she's had some increased pain and burning over the last week.  Her family member says that her legs look a little bit more red than they normally do and she's having worsening ulcers to her legs. She denies any fevers. She denies any calf pain. She denies any chest pain or shortness of breath. She has a primary care physician here at [facility] health care. She recently has seen a diabetic specialist at [facility] and has been referred to wound care clinic at [facility]. She has her first appointment on November 6. The main reason she came in today because her sores had worsening pain in the pain is not controlled with ultram that she's been taking  Patient is a 66 y.o. female presenting with leg pain. The history is provided by the patient.  Leg Pain  Pertinent negatives include no numbness.    Past Medical History  Diagnosis Date  . Type II or unspecified type diabetes mellitus without mention of complication, not stated as uncontrolled     insulin dep  . Hyperlipidemia     hx rhabdo on statins  . Hypertension   . CHF (congestive heart failure)     diastolic  . OSA (obstructive sleep apnea)     insignificant apnea events 05/2009 sleep study  . Chronic diarrhea     a/w nausea - felt related to IBS  . RLS (restless legs syndrome)   . Osteoarthritis   . Stasis dermatitis     Past Surgical History  Procedure Date  . Tubal ligation 1980  . Cholecystectomy 1997  . Tonsillectomy 1970  . Uterine polyp removal 2008  . Umbilical hernia repair 1995    Family History  Problem Relation Age of Onset  . Heart disease Mother 23   MI  . Heart disease Father 1    MI  . Heart disease Other     MI- uncle and multiple aunts (aunts with CVAs)  . Cancer Other     Stomach cancer grandmother    History  Substance Use Topics  . Smoking status: Never Smoker   . Smokeless tobacco: Never Used  . Alcohol Use: No    OB History    Grav Para Term Preterm Abortions TAB SAB Ect Mult Living                  Review of Systems  Constitutional: Negative for fever, chills, diaphoresis and fatigue.  HENT: Negative for congestion, rhinorrhea and sneezing.   Eyes: Negative.   Respiratory: Negative for cough, chest tightness and shortness of breath.   Cardiovascular: Negative for chest pain and leg swelling.  Gastrointestinal: Negative for nausea, vomiting, abdominal pain, diarrhea and blood in stool.  Genitourinary: Negative for frequency, hematuria, flank pain and difficulty urinating.  Musculoskeletal: Negative for back pain and arthralgias.  Skin: Positive for wound. Negative for rash.  Neurological: Negative for dizziness, speech difficulty, weakness, numbness and headaches.    Allergies  Morphine; Sulfa antibiotics; and Sulfonamide derivatives  Home Medications   Current Outpatient Rx  Name Route Sig Dispense Refill  . ASPIRIN 81 MG PO TABS Oral Take 81  mg by mouth daily.     [name] CARVEDILOL 25 MG PO TABS  TAKE 1 TABLET BY MOUTH TWICE DAILY 180 tablet 0  . CHOLECALCIFEROL 400 UNITS PO TABS Oral Take 400 Units by mouth daily.    . COQ10 30 MG PO CAPS Oral Take 1 capsule by mouth daily.     [name] VITAMIN B-12 PO Oral Take 1 tablet by mouth daily.    . FENOFIBRATE 160 MG PO TABS  TAKE 1 TABLET BY MOUTH EVERY DAY 30 tablet 5  . FLUTICASONE PROPIONATE 0.005 % EX OINT Topical Apply topically 3 (three) times daily as needed. For itching 60 g 2  . GABAPENTIN 300 MG PO CAPS Oral Take 1 capsule (300 mg total) by mouth at bedtime. 30 capsule 2  . GARLIC 1000 MG PO CAPS Oral Take 1 capsule by mouth daily.    [name] HYDRALAZINE HCL 50  MG PO TABS Oral Take 1 tablet (50 mg total) by mouth 3 (three) times daily. 90 tablet 6  . INSULIN GLARGINE 100 UNIT/ML [place] SOLN Subcutaneous Inject 60 Units into the skin at bedtime. 10 mL 12  . INSULIN LISPRO (HUMAN) 100 UNIT/ML [place] SOLN Subcutaneous Inject 35-40 Units into the skin 3 (three) times daily before meals. 35 with breakfast, 35 with lunch, and 40 units with dinner    . LOVAZA 1 G PO CAPS  TAKE 2 CAPSULES BY MOUTH TWICE DAILY 120 capsule 5  . NYSTATIN 100000 UNIT/GM EX POWD Topical Apply topically 4 (four) times daily. 60 g 0  . POTASSIUM CHLORIDE ER 10 MEQ PO TBCR  Take 3 tablets in the morning and 2 tablets in the evening 150 tablet 6  . ROPINIROLE HCL 2 MG PO TABS Oral Take 1 tablet (2 mg total) by mouth 2 (two) times daily. 60 tablet 2  . TORSEMIDE 20 MG PO TABS  Take 5 tablets daily at the same time in the morning 150 tablet 6  . GLUCOSE BLOOD VI STRP Other 1 each by Other route 2 (two) times daily. And lancets 2/day, 250.01 100 each 11  . METOLAZONE 2.5 MG PO TABS Oral Take 1 tablet (2.5 mg total) by mouth every other day. X 2 weeks, then as directed 30 tablet 1  . OXYCODONE-ACETAMINOPHEN 5-325 MG PO TABS Oral Take 2 tablets by mouth every 4 (four) hours as needed for pain. 15 tablet 0    BP 129/51  Pulse 67  Temp 97.9 F (36.6 C) (Oral)  Resp 17  SpO2 94%  Physical Exam  Constitutional: She is oriented to person, place, and time. She appears well-developed and well-nourished.       Obese  HENT:  Head: Normocephalic and atraumatic.  Eyes: Pupils are equal, round, and reactive to light.  Neck: Normal range of motion. Neck supple.  Cardiovascular: Normal rate, regular rhythm and normal heart sounds.   Pulmonary/Chest: Effort normal and breath sounds normal. No respiratory distress. She has no wheezes. She has no rales. She exhibits no tenderness.  Abdominal: Soft. Bowel sounds are normal. There is no tenderness. There is no rebound and no guarding.  Musculoskeletal:  Normal range of motion. She exhibits no edema.       Pitting edema of both lower legs with mild erythema to pretibial areas.  Multiple blisters, some open with clear, nonpurulent drainage.  Pulses not palpated (pt large and edematous), but has normal color, warmth to feet.  CRT<2  Lymphadenopathy:    She has no cervical adenopathy.  Neurological:  She is alert and oriented to person, place, and time.  Skin: Skin is warm and dry. No rash noted.  Psychiatric: She has a normal mood and affect.    ED Course  Procedures (including critical care time)  Results for orders placed during the hospital encounter of 12/15/11  CBC WITH DIFFERENTIAL      Component Value Range   WBC 8.8  4.0 - 10.5 K/uL   RBC 4.33  3.87 - 5.11 MIL/uL   Hemoglobin 10.5 (*) 12.0 - 15.0 g/dL   HCT 16.1 (*) 09.6 - 04.5 %   MCV 78.1  78.0 - 100.0 fL   MCH 24.2 (*) 26.0 - 34.0 pg   MCHC 31.1  30.0 - 36.0 g/dL   RDW 40.9 (*) 81.1 - 91.4 %   Platelets 369  150 - 400 K/uL   Neutrophils Relative 65  43 - 77 %   Neutro Abs 5.7  1.7 - 7.7 K/uL   Lymphocytes Relative 25  12 - 46 %   Lymphs Abs 2.2  0.7 - 4.0 K/uL   Monocytes Relative 8  3 - 12 %   Monocytes Absolute 0.7  0.1 - 1.0 K/uL   Eosinophils Relative 2  0 - 5 %   Eosinophils Absolute 0.2  0.0 - 0.7 K/uL   Basophils Relative 0  0 - 1 %   Basophils Absolute 0.0  0.0 - 0.1 K/uL  BASIC METABOLIC PANEL      Component Value Range   Sodium 138  135 - 145 mEq/L   Potassium 4.1  3.5 - 5.1 mEq/L   Chloride 96  96 - 112 mEq/L   CO2 30  19 - 32 mEq/L   Glucose, Bld 245 (*) 70 - 99 mg/dL   BUN 25 (*) 6 - 23 mg/dL   Creatinine, Ser 7.82  0.50 - 1.10 mg/dL   Calcium 8.8  8.4 - 95.6 mg/dL   GFR calc non Af Amer 70 (*) >90 mL/min   GFR calc Af Amer 81 (*) >90 mL/min   No results found.    1. Venous stasis dermatitis       MDM   Discussed with Dr. Felicity Coyer.  At this point, this looks more like chronic statis than infection.  Pt has no fever, no elevated WBC.  Her  pain was well controlled with the percocet, will give her a rx for this.  SHe has an appt with wound care clinic on Nov 5th at [facility].  Advised to contact Dr Felicity Coyer is she has any worsening symptoms in the meantime     Rolan Bucco, MD 12/15/11 1100  Rolan Bucco, MD 12/15/11 1240

## 2011-12-20 ENCOUNTER — Encounter (HOSPITAL_COMMUNITY): Payer: Medicare Other

## 2011-12-22 ENCOUNTER — Encounter (HOSPITAL_COMMUNITY): Payer: Medicare Other

## 2011-12-22 DIAGNOSIS — M79609 Pain in unspecified limb: Secondary | ICD-10-CM | POA: Diagnosis not present

## 2011-12-22 DIAGNOSIS — E119 Type 2 diabetes mellitus without complications: Secondary | ICD-10-CM | POA: Diagnosis not present

## 2011-12-22 DIAGNOSIS — Z5181 Encounter for therapeutic drug level monitoring: Secondary | ICD-10-CM | POA: Diagnosis not present

## 2011-12-22 DIAGNOSIS — G589 Mononeuropathy, unspecified: Secondary | ICD-10-CM | POA: Diagnosis not present

## 2011-12-22 DIAGNOSIS — Z79899 Other long term (current) drug therapy: Secondary | ICD-10-CM | POA: Diagnosis not present

## 2011-12-22 DIAGNOSIS — I517 Cardiomegaly: Secondary | ICD-10-CM | POA: Diagnosis not present

## 2011-12-22 DIAGNOSIS — I890 Lymphedema, not elsewhere classified: Secondary | ICD-10-CM | POA: Diagnosis not present

## 2011-12-22 DIAGNOSIS — R609 Edema, unspecified: Secondary | ICD-10-CM | POA: Diagnosis not present

## 2011-12-22 DIAGNOSIS — J984 Other disorders of lung: Secondary | ICD-10-CM | POA: Diagnosis not present

## 2011-12-22 DIAGNOSIS — R0602 Shortness of breath: Secondary | ICD-10-CM | POA: Diagnosis not present

## 2011-12-22 DIAGNOSIS — I509 Heart failure, unspecified: Secondary | ICD-10-CM | POA: Diagnosis not present

## 2011-12-22 DIAGNOSIS — R42 Dizziness and giddiness: Secondary | ICD-10-CM | POA: Diagnosis not present

## 2011-12-23 ENCOUNTER — Ambulatory Visit (INDEPENDENT_AMBULATORY_CARE_PROVIDER_SITE_OTHER): Payer: Medicare Other | Admitting: Internal Medicine

## 2011-12-23 ENCOUNTER — Telehealth: Payer: Self-pay | Admitting: Internal Medicine

## 2011-12-23 ENCOUNTER — Encounter: Payer: Self-pay | Admitting: Internal Medicine

## 2011-12-23 VITALS — BP 132/62 | HR 72 | Temp 98.7°F | Ht 60.0 in | Wt 276.0 lb

## 2011-12-23 DIAGNOSIS — I5032 Chronic diastolic (congestive) heart failure: Secondary | ICD-10-CM | POA: Diagnosis not present

## 2011-12-23 DIAGNOSIS — I10 Essential (primary) hypertension: Secondary | ICD-10-CM

## 2011-12-23 DIAGNOSIS — L0291 Cutaneous abscess, unspecified: Secondary | ICD-10-CM | POA: Diagnosis not present

## 2011-12-23 DIAGNOSIS — L0390 Cellulitis, unspecified: Secondary | ICD-10-CM | POA: Diagnosis not present

## 2011-12-23 DIAGNOSIS — M79609 Pain in unspecified limb: Secondary | ICD-10-CM | POA: Diagnosis not present

## 2011-12-23 DIAGNOSIS — M79605 Pain in left leg: Secondary | ICD-10-CM | POA: Insufficient documentation

## 2011-12-23 MED ORDER — OXYCODONE-ACETAMINOPHEN 5-325 MG PO TABS: ORAL_TABLET | ORAL | Status: DC

## 2011-12-23 MED ORDER — DOXYCYCLINE HYCLATE 100 MG PO TABS: 100.0000 mg | ORAL_TABLET | Freq: Two times a day (BID) | ORAL | Status: DC

## 2011-12-23 NOTE — Telephone Encounter (Signed)
Caller: Nanetta/Patient; Patient Name: Melissa Myers; PCP: Rene Paci (Adults only); Best Callback Phone Number: [phone]; pt is calling and states that she was at [facility] ED on 12/22/11  for skin lesions on calves bilaterially and ED MD felt that it is Dermatitis; sx started approximately 2 years ago when she started insulin; sx been getting worse for 2-3 weeks; "hundreds of lesions" that are draining clear to yellow; denies that anything was given to her for treatment in ED; pt states that she has an appt on 12/27/11 with the wound clinic; can't wait till then to be seen; rates pain 10/10 at present; no fever; not sleeping because of the pain; Triaged per Skin Lesions Guideline; See in 4 hours due to any skin lesion with sx of worsening infection; Appt made for today at 4:15pm Dr Jonny Ruiz; pt will comply

## 2011-12-23 NOTE — Patient Instructions (Addendum)
Take all new medications as prescribed Continue all other medications as before You should be called by the office to see Dr Felicity Coyer in 2 weeks Please keep your appointments with your specialists as you have planned - at [facility]:  Endocrinology, Cardiology and the Wound Center

## 2011-12-24 ENCOUNTER — Encounter: Payer: Self-pay | Admitting: Internal Medicine

## 2011-12-24 NOTE — Progress Notes (Signed)
Subjective:    Patient ID: Melissa Myers, female    DOB: 1945/11/09, 66 y.o.   MRN: [id number]  HPI  Here after seeing endocrinology at [facility] yesterday with mult labs and arranged per pt to further f/u with wound clinic for mult leg sores and Cardiology for leg swelling;  Pt here today quite dissapointed b/c "nothing was done", tearful and agitated regarding bilat distal LE leg pain with draining fluid so that the socks are soaked;  Had seen [facility] ER late last wk and tx with limited percocet 5/325 which greatly helped but now out and legs are worse;  Legs very tender, more red and swollen than last wk, worse pain to walk (severe) but only slightly less to sit, swelling not better with leg elevation;  Denies fever, red streaks, chills or frank abscess.  Nothing else makes better or worse.  No prior hx of PVD, but long hx of stasis dermatitis, obesity, CHF, OSA and mult CRF;'s.   Past Medical History  Diagnosis Date  . Type II or unspecified type diabetes mellitus without mention of complication, not stated as uncontrolled     insulin dep  . Hyperlipidemia     hx rhabdo on statins  . Hypertension   . CHF (congestive heart failure)     diastolic  . OSA (obstructive sleep apnea)     insignificant apnea events 05/2009 sleep study  . Chronic diarrhea     a/w nausea - felt related to IBS  . RLS (restless legs syndrome)   . Osteoarthritis   . Stasis dermatitis    Past Surgical History  Procedure Date  . Tubal ligation 1980  . Cholecystectomy 1997  . Tonsillectomy 1970  . Uterine polyp removal 2008  . Umbilical hernia repair 1995    reports that she has never smoked. She has never used smokeless tobacco. She reports that she does not drink alcohol or use illicit drugs. family history includes Cancer in her other; Heart disease in her other; Heart disease (age of onset:52) in her mother; and Heart disease (age of onset:54) in her father. Allergies  Allergen Reactions  . Morphine    REACTION: GI upset and headaches  . Sulfa Antibiotics Diarrhea  . Sulfonamide Derivatives    Current Outpatient Prescriptions on File Prior to Visit  Medication Sig Dispense Refill  . aspirin 81 MG tablet Take 81 mg by mouth daily.       . carvedilol (COREG) 25 MG tablet TAKE 1 TABLET BY MOUTH TWICE DAILY  180 tablet  0  . cholecalciferol (VITAMIN D) 400 UNITS TABS Take 400 Units by mouth daily.      . Coenzyme Q10 (COQ10) 30 MG CAPS Take 1 capsule by mouth daily.       . Cyanocobalamin (VITAMIN B-12 PO) Take 1 tablet by mouth daily.      . fenofibrate 160 MG tablet TAKE 1 TABLET BY MOUTH EVERY DAY  30 tablet  5  . flyticasone (CUTIVATE) 0.005 % ointment Apply topically 3 (three) times daily as needed. For itching  60 g  2  . gabapentin (NEURONTIN) 300 MG capsule Take 1 capsule (300 mg total) by mouth at bedtime.  30 capsule  2  . Garlic 1000 MG CAPS Take 1 capsule by mouth daily.      [name] glucose blood (FREESTYLE LITE) test strip 1 each by Other route 2 (two) times daily. And lancets 2/day, 250.01  100 each  11  . hydrALAZINE (APRESOLINE) 50 MG tablet  Take 1 tablet (50 mg total) by mouth 3 (three) times daily.  90 tablet  6  . insulin glargine (LANTUS) 100 UNIT/ML injection Inject 60 Units into the skin at bedtime.  10 mL  12  . insulin lispro (HUMALOG KWIKPEN) 100 UNIT/ML injection Inject 35-40 Units into the skin 3 (three) times daily before meals. 35 with breakfast, 35 with lunch, and 40 units with dinner      . LOVAZA 1 G capsule TAKE 2 CAPSULES BY MOUTH TWICE DAILY  120 capsule  5  . metolazone (ZAROXOLYN) 2.5 MG tablet Take 1 tablet (2.5 mg total) by mouth every other day. X 2 weeks, then as directed  30 tablet  1  . nystatin (MYCOSTATIN) powder Apply topically 4 (four) times daily.  60 g  0  . potassium chloride (KLOR-CON 10) 10 MEQ tablet Take 3 tablets in the morning and 2 tablets in the evening  150 tablet  6  . rOPINIRole (REQUIP) 2 MG tablet Take 1 tablet (2 mg total) by mouth 2  (two) times daily.  60 tablet  2  . torsemide (DEMADEX) 20 MG tablet Take 5 tablets daily at the same time in the morning  150 tablet  6  . DISCONTD: insulin lispro (HUMALOG KWIKPEN) 100 UNIT/ML injection Inject 35 Units into the skin 3 (three) times daily before meals.  45 mL  12   Review of Systems  Constitutional: Negative for diaphoresis and unexpected weight change.  HENT: Negative for tinnitus.   Eyes: Negative for photophobia and visual disturbance.  Respiratory: Negative for choking and stridor.   Gastrointestinal: Negative for vomiting and blood in stool.  Genitourinary: Negative for hematuria and decreased urine volume.  Musculoskeletal: Negative for gait problem. Except for the above Neurological: Negative for tremors and numbness.  Psychiatric/Behavioral: Negative for decreased concentration. The patient is not hyperactive.      Objective:   Physical Exam BP 132/62  Pulse 72  Temp 98.7 F (37.1 C) (Oral)  Ht 5' (1.524 m)  Wt 276 lb (125.193 kg)  BMI 53.90 kg/m2  SpO2 95% Physical Exam  VS noted Constitutional: Pt appears well-developed and well-nourished.  HENT: Head: Normocephalic.  Right Ear: External ear normal.  Left Ear: External ear normal.  Eyes: Conjunctivae and EOM are normal. Pupils are equal, round, and reactive to light.  Neck: Normal range of motion. Neck supple.  Cardiovascular: Normal rate and regular rhythm.   Pulmonary/Chest: Effort normal and breath sounds normal.  Abd:  Soft, NT, non-distended, + BS Neurological: Pt is alert. Not confused  Skin: Bilat distal anterior LE's with marked tender erythema areas difficult to distinguish from her prior chronic dermatitis except for the severe tenderness and several small pustular lesions on the right, and several other weepy lesions as well bilat Dorsalis pedis 1+ bilat  Psychiatric:. Thought content normal.     Assessment & Plan:

## 2011-12-24 NOTE — Assessment & Plan Note (Signed)
stable overall by hx and exam, most recent data reviewed with pt, and pt to continue medical treatment as before BP Readings from Last 3 Encounters:  12/23/11 132/62  12/15/11 129/51  10/27/11 144/82

## 2011-12-24 NOTE — Assessment & Plan Note (Signed)
I think related to cellulitis, chronic dermatitis, recent increased acute on chronic swelling, cant r/o neuritic component as well, appears to have OK arterial supply;  For repeat limited percocet asd,  to f/u any worsening symptoms or concerns

## 2011-12-24 NOTE — Assessment & Plan Note (Signed)
At least mod in terms of severity though limited area to the LE's right > left, for antibx course,  to f/u any worsening symptoms or concerns, f/u with PCP, or to ER if worse

## 2011-12-24 NOTE — Assessment & Plan Note (Signed)
Ideally should also be addressed per cardilogy and has f/u referral at [facility] per pt

## 2011-12-27 ENCOUNTER — Encounter (HOSPITAL_COMMUNITY): Payer: Medicare Other

## 2011-12-27 DIAGNOSIS — R609 Edema, unspecified: Secondary | ICD-10-CM | POA: Diagnosis not present

## 2011-12-27 DIAGNOSIS — I890 Lymphedema, not elsewhere classified: Secondary | ICD-10-CM | POA: Diagnosis not present

## 2011-12-27 DIAGNOSIS — E8881 Metabolic syndrome: Secondary | ICD-10-CM | POA: Diagnosis not present

## 2011-12-27 DIAGNOSIS — I5030 Unspecified diastolic (congestive) heart failure: Secondary | ICD-10-CM | POA: Diagnosis not present

## 2011-12-27 DIAGNOSIS — R635 Abnormal weight gain: Secondary | ICD-10-CM | POA: Diagnosis not present

## 2011-12-27 DIAGNOSIS — L989 Disorder of the skin and subcutaneous tissue, unspecified: Secondary | ICD-10-CM | POA: Diagnosis not present

## 2011-12-27 DIAGNOSIS — R0602 Shortness of breath: Secondary | ICD-10-CM | POA: Diagnosis not present

## 2011-12-27 DIAGNOSIS — I517 Cardiomegaly: Secondary | ICD-10-CM | POA: Diagnosis not present

## 2011-12-27 DIAGNOSIS — E119 Type 2 diabetes mellitus without complications: Secondary | ICD-10-CM | POA: Diagnosis not present

## 2011-12-27 DIAGNOSIS — I872 Venous insufficiency (chronic) (peripheral): Secondary | ICD-10-CM | POA: Diagnosis not present

## 2011-12-27 DIAGNOSIS — M4714 Other spondylosis with myelopathy, thoracic region: Secondary | ICD-10-CM | POA: Diagnosis not present

## 2011-12-27 DIAGNOSIS — J984 Other disorders of lung: Secondary | ICD-10-CM | POA: Diagnosis not present

## 2011-12-30 ENCOUNTER — Encounter: Payer: Self-pay | Admitting: Internal Medicine

## 2011-12-30 ENCOUNTER — Ambulatory Visit (INDEPENDENT_AMBULATORY_CARE_PROVIDER_SITE_OTHER): Payer: Medicare Other | Admitting: Internal Medicine

## 2011-12-30 VITALS — BP 114/52 | HR 59 | Temp 98.2°F | Ht 60.0 in | Wt 265.0 lb

## 2011-12-30 DIAGNOSIS — M79609 Pain in unspecified limb: Secondary | ICD-10-CM | POA: Diagnosis not present

## 2011-12-30 DIAGNOSIS — I8310 Varicose veins of unspecified lower extremity with inflammation: Secondary | ICD-10-CM

## 2011-12-30 DIAGNOSIS — B372 Candidiasis of skin and nail: Secondary | ICD-10-CM

## 2011-12-30 DIAGNOSIS — M7989 Other specified soft tissue disorders: Secondary | ICD-10-CM | POA: Diagnosis not present

## 2011-12-30 DIAGNOSIS — I890 Lymphedema, not elsewhere classified: Secondary | ICD-10-CM | POA: Diagnosis not present

## 2011-12-30 DIAGNOSIS — M79606 Pain in leg, unspecified: Secondary | ICD-10-CM

## 2011-12-30 DIAGNOSIS — I872 Venous insufficiency (chronic) (peripheral): Secondary | ICD-10-CM

## 2011-12-30 MED ORDER — NYSTATIN 100000 UNIT/GM EX POWD: Freq: Four times a day (QID) | CUTANEOUS | Status: DC

## 2011-12-30 MED ORDER — OXYCODONE-ACETAMINOPHEN 5-325 MG PO TABS: ORAL_TABLET | ORAL | Status: DC

## 2011-12-30 MED ORDER — FUROSEMIDE 20 MG PO TABS: 40.0000 mg | ORAL_TABLET | Freq: Two times a day (BID) | ORAL | Status: DC

## 2011-12-30 NOTE — Patient Instructions (Signed)
Stasis Dermatitis Stasis dermatitis occurs when veins lose the ability to pump blood back to the heart (poor venous circulation). It causes a reddish-purple to brownish scaly, itchy rash on the legs. The rash comes from pooling of blood (stasis). CAUSES   This occurs because the veins do not work very well anymore or because pressure may be increased in the veins due to other conditions. With blood pooling, the increased pressure in the tiny blood vessels (capillaries) causes fluid to leak out of the capillaries into the tissue. The extra fluid makes it harder for the blood to feed the cells and get rid of waste products. SYMPTOMS   Stasis dermatitis appears as red, scaly, itchy patches on the legs. A yellowish or light brown discoloration is also present. Due to scratching or other injury, these patches can become an ulcer. This ulcer may remain for long periods of time. The ulcer can also become infected. Swelling of the legs is often present with stasis dermatitis. If the leg is swollen, this increases the risk of infection and further damage to the skin. Sometimes, intense itching, tingling, and burning occurs before signs of stasis dermatitis appear. You may find yourself scratching the insides of your ankles or rubbing your ankles together before the rash appears. After healing, there are often brown spots on the affected skin. DIAGNOSIS   Your caregiver makes this diagnosis based on an exam. Other tests may be done to better understand the cause. TREATMENT   If underlying conditions are present, they must be treated. Some of these conditions are heart failure, thyroid problems, poor nutrition, and varicose veins.  Cortisone creams and ointments applied to the skin (topically) may be needed, as well as medicine to reduce swelling in the legs (diuretics).   Compression stockings or an elastic wrap may also be needed to reduce swelling.   If there is an infection, antibiotic medicines may also be  used.  HOME CARE INSTRUCTIONS    Try to rest and raise (elevate) the affected leg above the level of the heart, if possible.   Burow's solution wet packs applied for 30 minutes, 3 times daily, will help the weepy rash. Stop using the packs before your skin gets too dry. You can also use a mixture of 3 parts white vinegar to 1 quart water.   Grease your legs daily with ointments, such as petroleum jelly, to fight dryness.   Avoid scratching or injuring the area.  SEEK IMMEDIATE MEDICAL CARE IF:    Your rash gets worse.   An ulcer forms.   You have an oral temperature above 102 F (38.9 C), not controlled by medicine.   You have any other severe symptoms.  Document Released: 05/19/2005 Document Revised: 05/02/2011 Document Reviewed: 06/29/2009 [facility] Patient Information 2013 [place], [place].

## 2011-12-30 NOTE — Progress Notes (Signed)
Subjective:    Patient ID: Melissa Myers, female    DOB: [birth year], 66 y.o.   MRN: [id number]  HPI  Pt presents to the clinic today with c/o leg pain. This is a chronic problem for Melissa Myers due to her veonous statsis. She c/o leg swelling, leg pain, redness and weeping ulcerations. She has previously been prescribe Torsemide and Keflex by Dr. Felicity Coyer. After a few weeks, the problem recurred and Dr. Jonny Ruiz prescribed her doxycycline and percocet.  Review of Systems     Past Medical History  Diagnosis Date  . Type II or unspecified type diabetes mellitus without mention of complication, not stated as uncontrolled     insulin dep  . Hyperlipidemia     hx rhabdo on statins  . Hypertension   . CHF (congestive heart failure)     diastolic  . OSA (obstructive sleep apnea)     insignificant apnea events 05/2009 sleep study  . Chronic diarrhea     a/w nausea - felt related to IBS  . RLS (restless legs syndrome)   . Osteoarthritis   . Stasis dermatitis     Current Outpatient Prescriptions  Medication Sig Dispense Refill  . aspirin 81 MG tablet Take 81 mg by mouth daily.       . carvedilol (COREG) 25 MG tablet TAKE 1 TABLET BY MOUTH TWICE DAILY  180 tablet  0  . cholecalciferol (VITAMIN D) 400 UNITS TABS Take 400 Units by mouth daily.      . Coenzyme Q10 (COQ10) 30 MG CAPS Take 1 capsule by mouth daily.       . Cyanocobalamin (VITAMIN B-12 PO) Take 1 tablet by mouth daily.      [name] doxycycline (VIBRA-TABS) 100 MG tablet Take 1 tablet (100 mg total) by mouth 2 (two) times daily.  28 tablet  0  . fenofibrate 160 MG tablet TAKE 1 TABLET BY MOUTH EVERY DAY  30 tablet  5  . flyticasone (CUTIVATE) 0.005 % ointment Apply topically 3 (three) times daily as needed. For itching  60 g  2  . gabapentin (NEURONTIN) 300 MG capsule Take 1 capsule (300 mg total) by mouth at bedtime.  30 capsule  2  . Garlic 1000 MG CAPS Take 1 capsule by mouth daily.      [name] glucose blood (FREESTYLE LITE) test  strip 1 each by Other route 2 (two) times daily. And lancets 2/day, 250.01  100 each  11  . hydrALAZINE (APRESOLINE) 50 MG tablet Take 1 tablet (50 mg total) by mouth 3 (three) times daily.  90 tablet  6  . insulin glargine (LANTUS) 100 UNIT/ML injection Inject 60 Units into the skin at bedtime.  10 mL  12  . insulin lispro (HUMALOG KWIKPEN) 100 UNIT/ML injection Inject 35-40 Units into the skin 3 (three) times daily before meals. 35 with breakfast, 35 with lunch, and 40 units with dinner      . LOVAZA 1 G capsule TAKE 2 CAPSULES BY MOUTH TWICE DAILY  120 capsule  5  . metolazone (ZAROXOLYN) 2.5 MG tablet Take 1 tablet (2.5 mg total) by mouth every other day. X 2 weeks, then as directed  30 tablet  1  . nystatin (MYCOSTATIN) powder Apply topically 4 (four) times daily.  60 g  0  . oxyCODONE-acetaminophen (PERCOCET/ROXICET) 5-325 MG per tablet 1-2 tabs every 6 hours as needed for pain  60 tablet  0  . potassium chloride (KLOR-CON 10) 10 MEQ tablet Take 3 tablets  in the morning and 2 tablets in the evening  150 tablet  6  . rOPINIRole (REQUIP) 2 MG tablet Take 1 tablet (2 mg total) by mouth 2 (two) times daily.  60 tablet  2  . torsemide (DEMADEX) 20 MG tablet Take 5 tablets daily at the same time in the morning  150 tablet  6  . [DISCONTINUED] insulin lispro (HUMALOG KWIKPEN) 100 UNIT/ML injection Inject 35 Units into the skin 3 (three) times daily before meals.  45 mL  12    Allergies  Allergen Reactions  . Morphine     REACTION: GI upset and headaches  . Sulfa Antibiotics Diarrhea  . Sulfonamide Derivatives     Family History  Problem Relation Age of Onset  . Heart disease Mother 67    MI  . Heart disease Father 66    MI  . Heart disease Other     MI- uncle and multiple aunts (aunts with CVAs)  . Cancer Other     Stomach cancer grandmother    History   Social History  . Marital Status: Married    Spouse Name: N/A    Number of Children: N/A  . Years of Education: N/A    Occupational History  . Not on file.   Social History Main Topics  . Smoking status: Never Smoker   . Smokeless tobacco: Never Used  . Alcohol Use: No  . Drug Use: No  . Sexually Active: Not on file   Other Topics Concern  . Not on file   Social History Narrative   Lives with spouse and mother. Retired [employer] operational officer, now housewife     Constitutional: Denies fever, malaise, fatigue, headache or abrupt weight changes.  Respiratory: Denies difficulty breathing, shortness of breath, cough or sputum production.   Cardiovascular: Pt reports bilateral leg swelling. Denies chest pain, chest tightness, palpitations or swelling in the hands.  Musculoskeletal: Denies decrease in range of motion, difficulty with gait, muscle pain or joint pain and swelling.  Skin:  Pt reports redness and ulceration that weep to the BLE. Denies rashes or lesions.    No other specific complaints in a complete review of systems (except as listed in HPI above).   Objective:   Physical Exam   Wt Readings from Last 3 Encounters:  12/23/11 276 lb (125.193 kg)  10/27/11 260 lb (117.935 kg)  10/21/11 250 lb (113.399 kg)    General: Appears her stated age, well developed, well nourished in NAD. Skin: Warm, dry and intact. No rashes, lesions or ulcerations noted. Cardiovascular: Normal rate and rhythm. S1,S2 noted.  No murmur, rubs or gallops noted. No JVD  Noted. 2+ BLE pitting edema. No carotid bruits noted. Pulmonary/Chest: Normal effort and positive vesicular breath sounds. No respiratory distress. No wheezes, rales or ronchi noted.  Musculoskeletal: Normal range of motion. No signs of joint swelling. No difficulty with gait.  Skin: Rubor skin from the schin down on BLE. Multiple non weeping ulcerations present. Skin otherwise intact.     Assessment & Plan:   Stasis Dermatitis Leg pain Leg swelling lymphedema Yeast under panus  Refill given for percocet  Refill given for Nystatin powder per request  for yeast Referral given for home care for lymphedema wraps 2-3/ week Discontinue torsemide 100 mg BID, eRx sent in for lasix 40 mg BID

## 2012-01-05 ENCOUNTER — Encounter: Payer: Self-pay | Admitting: Internal Medicine

## 2012-01-05 ENCOUNTER — Ambulatory Visit (INDEPENDENT_AMBULATORY_CARE_PROVIDER_SITE_OTHER): Payer: Medicare Other | Admitting: Internal Medicine

## 2012-01-05 VITALS — BP 110/52 | HR 67 | Temp 98.1°F | Ht 60.0 in | Wt 269.8 lb

## 2012-01-05 DIAGNOSIS — M545 Low back pain: Secondary | ICD-10-CM

## 2012-01-05 DIAGNOSIS — I872 Venous insufficiency (chronic) (peripheral): Secondary | ICD-10-CM

## 2012-01-05 DIAGNOSIS — Z23 Encounter for immunization: Secondary | ICD-10-CM

## 2012-01-05 DIAGNOSIS — I5032 Chronic diastolic (congestive) heart failure: Secondary | ICD-10-CM

## 2012-01-05 DIAGNOSIS — E1029 Type 1 diabetes mellitus with other diabetic kidney complication: Secondary | ICD-10-CM | POA: Diagnosis not present

## 2012-01-05 DIAGNOSIS — E1065 Type 1 diabetes mellitus with hyperglycemia: Secondary | ICD-10-CM | POA: Diagnosis not present

## 2012-01-05 DIAGNOSIS — I8310 Varicose veins of unspecified lower extremity with inflammation: Secondary | ICD-10-CM | POA: Diagnosis not present

## 2012-01-05 MED ORDER — GABAPENTIN 300 MG PO CAPS: 300.0000 mg | ORAL_CAPSULE | Freq: Three times a day (TID) | ORAL | Status: DC

## 2012-01-05 MED ORDER — NYSTATIN 100000 UNIT/GM EX POWD: Freq: Four times a day (QID) | CUTANEOUS | Status: DC

## 2012-01-05 MED ORDER — FUROSEMIDE 20 MG PO TABS: 80.0000 mg | ORAL_TABLET | Freq: Two times a day (BID) | ORAL | Status: DC

## 2012-01-05 NOTE — Assessment & Plan Note (Signed)
Changed endo from [facility] to [place] for new opinion, previously with [facility] following at [facility] - on TID humulin and Lantus  Lab Results  Component Value Date   HGBA1C 8.7* 10/05/2011

## 2012-01-05 NOTE — Progress Notes (Signed)
Subjective:    Patient ID: Melissa Myers, female    DOB: [birth year], 66 y.o.   MRN: [id number]  Back Pain Pertinent negatives include no chest pain or fever.   also reviewed chronic medical issues:  Venous stasis dermatitis - severe distal BLE - eval at [facility] summer 2013 confirmed same - Onset Spring 2012, intermittent but gradually worsening. Unresolved with various courses of antibiotics or topical steroids. Associated with itching. No other rash on body. S/p local derm eval in 2012 and 2nd opinion [facility] 08/2011 = same diagnosis -temporarily improved swelling on zaroxyln in addition to demadex (2 weeks tx mid 09/2011)  Dyspnea on exertion. Unchanged and chronic; Worse with increased weight gain and when laying flat. Not improved with increased diuretics. CT negative for PE on January 03, 2011 in ER, neg RHC 03/2011 -   Insomnia - manifest as restless legs for which she takes Requip, but little relief Falls asleep during daytime, prior events while driving reviewed Feels "smothered" when laying supine Prior sleep study 2011 "did not show apnea" but followup evaluation December 2012 feels sleep apnea highly probable based on body habitus - unable to tol home CPAP split study  DM2 -variable compliance with prescribed medications; intolerant of metformin due to GI side effects. Does not regularly check sugars. No longer following with endo Sharl Ma) due to complications/side effects with levemir, on Lantus since 09/2011 and s/p [facility] endo eval  Low back pain, chronic. Worse with exertion, relieved with sitting or rest. Denies trauma or falls. No radiation of pain. No fever or urinary change. No relief with over-the-counter medications.  Past Medical History  Diagnosis Date  . Type II or unspecified type diabetes mellitus without mention of complication, not stated as uncontrolled     insulin dep  . Hyperlipidemia     hx rhabdo on statins  . Hypertension   . CHF (congestive heart failure)    diastolic  . OSA (obstructive sleep apnea)     insignificant apnea events 05/2009 sleep study  . Chronic diarrhea     a/w nausea - felt related to IBS  . RLS (restless legs syndrome)   . Osteoarthritis   . Stasis dermatitis      Review of Systems  Constitutional: Positive for fatigue. Negative for fever and unexpected weight change.  Respiratory: Positive for shortness of breath (chronic). Negative for cough, chest tightness and wheezing.   Cardiovascular: Negative for chest pain and palpitations. Leg swelling: chronic.  Musculoskeletal: Positive for back pain.  Skin: Positive for rash. Negative for wound.       Objective:   Physical Exam  BP 110/52  Pulse 67  Temp 98.1 F (36.7 C) (Oral)  Ht 5' (1.524 m)  Wt 269 lb 12.8 oz (122.38 kg)  BMI 52.69 kg/m2  SpO2 94% Wt Readings from Last 3 Encounters:  01/05/12 269 lb 12.8 oz (122.38 kg)  12/30/11 265 lb (120.203 kg)  12/23/11 276 lb (125.193 kg)   Constitutional: She is overweight; appears well-developed and well-nourished. No distress. mom at side Neck: Thick; Normal range of motion. Neck supple. No JVD present. No thyromegaly present.  Cardiovascular: Normal rate, regular rhythm and normal heart sounds.  No murmur heard. chronic tight/woody 2+ BLE edema Pulmonary/Chest: Effort normal and breath sounds diminished at bases bilaterally. No respiratory distress. She has no wheezes.  Skin: chronic thickening and mild erythema of distal BLE, stasis dermatitis without change - few pustules on anterior shin or weeping. Evidence for excoriation - Psychiatric: She  has a dysthymic mood and affect. Her behavior is normal. Judgment and thought content normal.       Lab Results  Component Value Date   WBC 8.8 12/15/2011   HGB 10.5* 12/15/2011   HCT 33.8* 12/15/2011   PLT 369 12/15/2011   CHOL 243* 02/28/2011   TRIG 281.0* 02/28/2011   HDL 53.10 02/28/2011   LDLDIRECT 120.9 02/28/2011   ALT 27 07/21/2010   AST 36 07/21/2010   NA 138  12/15/2011   K 4.1 12/15/2011   CL 96 12/15/2011   CREATININE 0.85 12/15/2011   BUN 25* 12/15/2011   CO2 30 12/15/2011   TSH 1.35 05/29/2009   INR 1.00 04/15/2011   HGBA1C 8.7* 10/05/2011    Assessment & Plan:  See problem list. Medications and labs reviewed today.

## 2012-01-05 NOTE — Assessment & Plan Note (Signed)
No neuro deficits on exam -  Chronic DDD 05/2010 and facet mediated pain continue tramadol, gabapentin and prn percocet (preferred to norco) -  avoiding NSAIDs due to lymphedema [facility]

## 2012-01-05 NOTE — Addendum Note (Signed)
Addended by: Deatra James on: 01/05/2012 03:02 PM   Modules accepted: Orders

## 2012-01-05 NOTE — Patient Instructions (Addendum)
It was good to see you today. Increase lasix (furosemide) to 4 tabs (80mg ) 2x/day Increase gabapentin to 300mg  3x/day for neuropathy pain Your prescription(s) have been submitted to your pharmacy. Please take as directed and contact our office if you believe you are having problem(s) with the medication(s). Other Medications reviewed, no changes at this time. we'll make referral to [facility] for [facility] wraps as recommended by [facility]. Our office will contact you regarding appointment(s) once made. Arrange follow up with [facility] cardiology in next few weeks to review your heart status and heart failure update Flu shot today

## 2012-01-05 NOTE — Assessment & Plan Note (Signed)
Following with cardiology for same, volume status complicated by renal insufficiency and chronic venous insufficiency Remain on loop diuretics (?torsemide more effective than furosemide)- see above Cards changed amlodipine to hydralazine s/p RHC 04/06/11 Per cardiology, also on Coreg to decrease of the mid cavity gradient Medications and interval hx reviewed, reminded of importance of diuretic and other med compliance Using compressing hose if unable to arrange [facility] boot wraps

## 2012-01-05 NOTE — Assessment & Plan Note (Signed)
Complicated by severe venous stasis and lymphedema - high dose loop diuretic with prior prn zaroxyln Continue high dose diuretic - follow up with CHF cards (see next) Dx confirmed from [facility] derm 2nd opinion 08/2011 - also following with wound clinic reorder [facility] for una boots and xeroform for open ulcers 2-3x/wk - anticipate for life per wound care experts [facility] consult reviewed)

## 2012-01-06 DIAGNOSIS — R7309 Other abnormal glucose: Secondary | ICD-10-CM | POA: Diagnosis not present

## 2012-01-06 DIAGNOSIS — R635 Abnormal weight gain: Secondary | ICD-10-CM | POA: Diagnosis not present

## 2012-01-06 DIAGNOSIS — R0602 Shortness of breath: Secondary | ICD-10-CM | POA: Diagnosis not present

## 2012-01-06 DIAGNOSIS — I8310 Varicose veins of unspecified lower extremity with inflammation: Secondary | ICD-10-CM | POA: Diagnosis not present

## 2012-01-06 DIAGNOSIS — R5381 Other malaise: Secondary | ICD-10-CM | POA: Diagnosis not present

## 2012-01-06 DIAGNOSIS — G589 Mononeuropathy, unspecified: Secondary | ICD-10-CM | POA: Diagnosis not present

## 2012-01-06 DIAGNOSIS — R42 Dizziness and giddiness: Secondary | ICD-10-CM | POA: Diagnosis not present

## 2012-01-06 DIAGNOSIS — I5030 Unspecified diastolic (congestive) heart failure: Secondary | ICD-10-CM | POA: Diagnosis not present

## 2012-01-06 DIAGNOSIS — K140 Glossitis: Secondary | ICD-10-CM | POA: Diagnosis not present

## 2012-01-06 DIAGNOSIS — E119 Type 2 diabetes mellitus without complications: Secondary | ICD-10-CM | POA: Diagnosis not present

## 2012-01-06 DIAGNOSIS — I872 Venous insufficiency (chronic) (peripheral): Secondary | ICD-10-CM | POA: Diagnosis not present

## 2012-01-06 DIAGNOSIS — E8881 Metabolic syndrome: Secondary | ICD-10-CM | POA: Diagnosis not present

## 2012-01-06 DIAGNOSIS — D649 Anemia, unspecified: Secondary | ICD-10-CM | POA: Diagnosis not present

## 2012-01-10 ENCOUNTER — Telehealth: Payer: Self-pay | Admitting: Internal Medicine

## 2012-01-10 DIAGNOSIS — I509 Heart failure, unspecified: Secondary | ICD-10-CM | POA: Diagnosis not present

## 2012-01-10 DIAGNOSIS — L97909 Non-pressure chronic ulcer of unspecified part of unspecified lower leg with unspecified severity: Secondary | ICD-10-CM | POA: Diagnosis not present

## 2012-01-10 DIAGNOSIS — I5031 Acute diastolic (congestive) heart failure: Secondary | ICD-10-CM | POA: Diagnosis not present

## 2012-01-10 DIAGNOSIS — I10 Essential (primary) hypertension: Secondary | ICD-10-CM | POA: Diagnosis not present

## 2012-01-10 DIAGNOSIS — E109 Type 1 diabetes mellitus without complications: Secondary | ICD-10-CM | POA: Diagnosis not present

## 2012-01-10 DIAGNOSIS — I872 Venous insufficiency (chronic) (peripheral): Secondary | ICD-10-CM | POA: Diagnosis not present

## 2012-01-10 NOTE — Telephone Encounter (Signed)
Caller: Sandra/; Phone: [phone]; Reason for Call: RN is wanting to make changes to patient's una boot.  Wants calcium alginate covered by "add" pad under una boot. Also states patient finished Doxycyline 11-18 and mouth is red 11-19. Nurse thinks may interfere with appetite. Does not think has thrush but thinks may benefit from [employer]. PLEASE CALL [facility] NURSE FOR CHANGES TO ORDERS

## 2012-01-11 ENCOUNTER — Other Ambulatory Visit: Payer: Self-pay

## 2012-01-11 ENCOUNTER — Other Ambulatory Visit: Payer: Self-pay | Admitting: Internal Medicine

## 2012-01-11 MED ORDER — DIPHENHYD-HYDROCORT-NYSTATIN MT SUSP: 5.0000 mL | Freq: Four times a day (QID) | OROMUCOSAL | Status: DC | PRN

## 2012-01-11 MED ORDER — OXYCODONE-ACETAMINOPHEN 5-325 MG PO TABS: ORAL_TABLET | ORAL | Status: DC

## 2012-01-11 NOTE — Telephone Encounter (Signed)
HHRN advised of verbal authorization and Rx ([facility] [address])

## 2012-01-11 NOTE — Telephone Encounter (Signed)
Ok for wound care changes as requested - Magic mouthwash also ok - erx done

## 2012-01-12 NOTE — Telephone Encounter (Signed)
Pt informed, Rx in cabinet for pt pick up  

## 2012-01-17 ENCOUNTER — Telehealth: Payer: Self-pay | Admitting: *Deleted

## 2012-01-17 NOTE — Telephone Encounter (Signed)
Pt refusal noted, but my recommendation to wrap legs is unchanged - remind pt that wrapping her legs is also recommendation of her [facility] wound care specialists thanks

## 2012-01-17 NOTE — Telephone Encounter (Signed)
[facility] RN is calling because pt declined/refused getting her legs wrapped.  Pt states that pain is unbearable when her legs are wrapped so she removed her bandages. Pt legs are weeping somewhat per [facility] and do not look good. Pt states that she will apply Vitamin E and keep them elevated.

## 2012-01-18 ENCOUNTER — Other Ambulatory Visit: Payer: Self-pay | Admitting: *Deleted

## 2012-01-18 MED ORDER — FENOFIBRATE 160 MG PO TABS: 160.0000 mg | ORAL_TABLET | Freq: Every day | ORAL | Status: DC

## 2012-01-18 NOTE — Telephone Encounter (Signed)
[facility] HHRN informed of MD's advisement.  

## 2012-01-23 ENCOUNTER — Other Ambulatory Visit (HOSPITAL_COMMUNITY): Payer: Self-pay | Admitting: *Deleted

## 2012-01-23 MED ORDER — CARVEDILOL 25 MG PO TABS: 25.0000 mg | ORAL_TABLET | Freq: Two times a day (BID) | ORAL | Status: DC

## 2012-01-25 ENCOUNTER — Telehealth: Payer: Self-pay | Admitting: Internal Medicine

## 2012-01-25 MED ORDER — OXYCODONE-ACETAMINOPHEN 5-325 MG PO TABS: ORAL_TABLET | ORAL | Status: DC

## 2012-01-25 NOTE — Telephone Encounter (Signed)
Called the patient informed hardcopy is ready for pickup at  The front desk.

## 2012-01-25 NOTE — Telephone Encounter (Signed)
I cant change the rx, but refill Done hardcopy to robin

## 2012-01-25 NOTE — Telephone Encounter (Signed)
Patient contacted pharmacy to request refill for drug below, but was directed her to call provider regarding refill.  Patient states she has tried to use just one, but some times has to have (2).  Patient uses [facility] at [phone]. oxyCODONE-acetaminophen (PERCOCET/ROXICET) 5-325 MG per tablet [[id number]]  Order Details     Dose: As Directed  Route: --  Frequency: As Directed    Dispense Quantity: 60 tablet  Refills: 0  Fills Remaining: 0             Sig: 1-2 tabs every 6 hours as needed for pain           Written Date: 01/11/12  Expiration Date: 03/11/12       Start Date: 01/11/12  End Date: --       Prescribed by: --  Authorized by: Newt Lukes, MD  Ordering User: Newt Lukes, MD

## 2012-02-01 ENCOUNTER — Ambulatory Visit (HOSPITAL_COMMUNITY): Payer: Medicare Other

## 2012-02-02 ENCOUNTER — Ambulatory Visit: Payer: Medicare Other | Admitting: Internal Medicine

## 2012-02-02 DIAGNOSIS — L97909 Non-pressure chronic ulcer of unspecified part of unspecified lower leg with unspecified severity: Secondary | ICD-10-CM | POA: Diagnosis not present

## 2012-02-02 DIAGNOSIS — I5031 Acute diastolic (congestive) heart failure: Secondary | ICD-10-CM | POA: Diagnosis not present

## 2012-02-02 DIAGNOSIS — I509 Heart failure, unspecified: Secondary | ICD-10-CM | POA: Diagnosis not present

## 2012-02-02 DIAGNOSIS — I872 Venous insufficiency (chronic) (peripheral): Secondary | ICD-10-CM

## 2012-02-06 ENCOUNTER — Other Ambulatory Visit: Payer: Self-pay | Admitting: Internal Medicine

## 2012-02-06 DIAGNOSIS — G8929 Other chronic pain: Secondary | ICD-10-CM

## 2012-02-06 NOTE — Telephone Encounter (Signed)
Pt is requesting a refill of the Oxycodone.  Pt is asking the amount dispensed be increased because she is taking 2 every 4 hours for pain.  Pt states she is allergic to Zinc.  OFFICE PLEASE FOLLOW  UP WITH PT WHEN RX REFILL IS READY

## 2012-02-07 MED ORDER — OXYCODONE-ACETAMINOPHEN 5-325 MG PO TABS: ORAL_TABLET | ORAL | Status: DC

## 2012-02-07 NOTE — Telephone Encounter (Signed)
A) when i wrote rx initially, oxycodone was meant to be a "prn" med for pain, not scheduled - since pt is taking scheduled doses, i will refer to pain management clinic to monitor and manage her pain med prescriptions  B) refill provided until pt can establish with new pain clinic for these prescriptions

## 2012-02-07 NOTE — Telephone Encounter (Signed)
Notified pt with md response.../lmb 

## 2012-02-09 DIAGNOSIS — S91009A Unspecified open wound, unspecified ankle, initial encounter: Secondary | ICD-10-CM | POA: Diagnosis not present

## 2012-02-09 DIAGNOSIS — S81009A Unspecified open wound, unspecified knee, initial encounter: Secondary | ICD-10-CM | POA: Diagnosis not present

## 2012-02-20 DIAGNOSIS — E119 Type 2 diabetes mellitus without complications: Secondary | ICD-10-CM | POA: Diagnosis not present

## 2012-02-20 DIAGNOSIS — Z79899 Other long term (current) drug therapy: Secondary | ICD-10-CM | POA: Diagnosis not present

## 2012-02-20 DIAGNOSIS — Z7982 Long term (current) use of aspirin: Secondary | ICD-10-CM | POA: Diagnosis not present

## 2012-02-20 DIAGNOSIS — Z794 Long term (current) use of insulin: Secondary | ICD-10-CM | POA: Diagnosis not present

## 2012-02-20 DIAGNOSIS — I8310 Varicose veins of unspecified lower extremity with inflammation: Secondary | ICD-10-CM | POA: Diagnosis not present

## 2012-02-20 DIAGNOSIS — E8881 Metabolic syndrome: Secondary | ICD-10-CM | POA: Diagnosis not present

## 2012-02-20 DIAGNOSIS — M79609 Pain in unspecified limb: Secondary | ICD-10-CM | POA: Diagnosis not present

## 2012-02-21 ENCOUNTER — Other Ambulatory Visit: Payer: Self-pay | Admitting: Endocrinology

## 2012-02-23 DIAGNOSIS — S91009A Unspecified open wound, unspecified ankle, initial encounter: Secondary | ICD-10-CM | POA: Diagnosis not present

## 2012-02-23 DIAGNOSIS — Z79899 Other long term (current) drug therapy: Secondary | ICD-10-CM | POA: Diagnosis not present

## 2012-02-23 DIAGNOSIS — S81009A Unspecified open wound, unspecified knee, initial encounter: Secondary | ICD-10-CM | POA: Diagnosis not present

## 2012-02-23 DIAGNOSIS — E559 Vitamin D deficiency, unspecified: Secondary | ICD-10-CM | POA: Diagnosis not present

## 2012-03-06 DIAGNOSIS — L989 Disorder of the skin and subcutaneous tissue, unspecified: Secondary | ICD-10-CM | POA: Diagnosis not present

## 2012-03-06 DIAGNOSIS — R609 Edema, unspecified: Secondary | ICD-10-CM | POA: Diagnosis not present

## 2012-03-06 DIAGNOSIS — I890 Lymphedema, not elsewhere classified: Secondary | ICD-10-CM | POA: Diagnosis not present

## 2012-03-10 ENCOUNTER — Other Ambulatory Visit: Payer: Self-pay | Admitting: Internal Medicine

## 2012-03-10 DIAGNOSIS — I872 Venous insufficiency (chronic) (peripheral): Secondary | ICD-10-CM | POA: Diagnosis not present

## 2012-03-10 DIAGNOSIS — I509 Heart failure, unspecified: Secondary | ICD-10-CM | POA: Diagnosis not present

## 2012-03-10 DIAGNOSIS — I10 Essential (primary) hypertension: Secondary | ICD-10-CM | POA: Diagnosis not present

## 2012-03-10 DIAGNOSIS — L97909 Non-pressure chronic ulcer of unspecified part of unspecified lower leg with unspecified severity: Secondary | ICD-10-CM | POA: Diagnosis not present

## 2012-03-10 DIAGNOSIS — E109 Type 1 diabetes mellitus without complications: Secondary | ICD-10-CM | POA: Diagnosis not present

## 2012-03-10 DIAGNOSIS — I5031 Acute diastolic (congestive) heart failure: Secondary | ICD-10-CM | POA: Diagnosis not present

## 2012-03-12 ENCOUNTER — Encounter (HOSPITAL_COMMUNITY): Payer: Medicare Other

## 2012-03-12 DIAGNOSIS — M1710 Unilateral primary osteoarthritis, unspecified knee: Secondary | ICD-10-CM | POA: Diagnosis not present

## 2012-03-13 DIAGNOSIS — E109 Type 1 diabetes mellitus without complications: Secondary | ICD-10-CM | POA: Diagnosis not present

## 2012-03-13 DIAGNOSIS — I872 Venous insufficiency (chronic) (peripheral): Secondary | ICD-10-CM | POA: Diagnosis not present

## 2012-03-13 DIAGNOSIS — I5031 Acute diastolic (congestive) heart failure: Secondary | ICD-10-CM | POA: Diagnosis not present

## 2012-03-13 DIAGNOSIS — L97909 Non-pressure chronic ulcer of unspecified part of unspecified lower leg with unspecified severity: Secondary | ICD-10-CM | POA: Diagnosis not present

## 2012-03-13 DIAGNOSIS — I509 Heart failure, unspecified: Secondary | ICD-10-CM | POA: Diagnosis not present

## 2012-03-13 DIAGNOSIS — I10 Essential (primary) hypertension: Secondary | ICD-10-CM | POA: Diagnosis not present

## 2012-03-15 DIAGNOSIS — E109 Type 1 diabetes mellitus without complications: Secondary | ICD-10-CM | POA: Diagnosis not present

## 2012-03-15 DIAGNOSIS — I5031 Acute diastolic (congestive) heart failure: Secondary | ICD-10-CM | POA: Diagnosis not present

## 2012-03-15 DIAGNOSIS — L97909 Non-pressure chronic ulcer of unspecified part of unspecified lower leg with unspecified severity: Secondary | ICD-10-CM | POA: Diagnosis not present

## 2012-03-15 DIAGNOSIS — I509 Heart failure, unspecified: Secondary | ICD-10-CM | POA: Diagnosis not present

## 2012-03-15 DIAGNOSIS — I10 Essential (primary) hypertension: Secondary | ICD-10-CM | POA: Diagnosis not present

## 2012-03-15 DIAGNOSIS — I872 Venous insufficiency (chronic) (peripheral): Secondary | ICD-10-CM | POA: Diagnosis not present

## 2012-03-19 DIAGNOSIS — M1710 Unilateral primary osteoarthritis, unspecified knee: Secondary | ICD-10-CM | POA: Diagnosis not present

## 2012-03-19 DIAGNOSIS — G894 Chronic pain syndrome: Secondary | ICD-10-CM | POA: Diagnosis not present

## 2012-03-19 DIAGNOSIS — Z79899 Other long term (current) drug therapy: Secondary | ICD-10-CM | POA: Diagnosis not present

## 2012-03-19 DIAGNOSIS — M47817 Spondylosis without myelopathy or radiculopathy, lumbosacral region: Secondary | ICD-10-CM | POA: Diagnosis not present

## 2012-03-20 ENCOUNTER — Encounter (HOSPITAL_BASED_OUTPATIENT_CLINIC_OR_DEPARTMENT_OTHER): Payer: Medicare Other | Attending: General Surgery

## 2012-03-20 DIAGNOSIS — E669 Obesity, unspecified: Secondary | ICD-10-CM | POA: Diagnosis not present

## 2012-03-20 DIAGNOSIS — E1169 Type 2 diabetes mellitus with other specified complication: Secondary | ICD-10-CM | POA: Diagnosis not present

## 2012-03-20 DIAGNOSIS — I872 Venous insufficiency (chronic) (peripheral): Secondary | ICD-10-CM | POA: Insufficient documentation

## 2012-03-20 DIAGNOSIS — L97909 Non-pressure chronic ulcer of unspecified part of unspecified lower leg with unspecified severity: Secondary | ICD-10-CM | POA: Diagnosis not present

## 2012-03-20 DIAGNOSIS — Z79899 Other long term (current) drug therapy: Secondary | ICD-10-CM | POA: Diagnosis not present

## 2012-03-20 DIAGNOSIS — I87319 Chronic venous hypertension (idiopathic) with ulcer of unspecified lower extremity: Secondary | ICD-10-CM | POA: Diagnosis not present

## 2012-03-20 DIAGNOSIS — I10 Essential (primary) hypertension: Secondary | ICD-10-CM | POA: Diagnosis not present

## 2012-03-20 DIAGNOSIS — G609 Hereditary and idiopathic neuropathy, unspecified: Secondary | ICD-10-CM | POA: Diagnosis not present

## 2012-03-20 LAB — GLUCOSE, CAPILLARY: Glucose-Capillary: 124 mg/dL — ABNORMAL HIGH (ref 70–99)

## 2012-03-21 ENCOUNTER — Other Ambulatory Visit: Payer: Self-pay | Admitting: *Deleted

## 2012-03-21 ENCOUNTER — Other Ambulatory Visit (HOSPITAL_COMMUNITY): Payer: Self-pay | Admitting: General Surgery

## 2012-03-21 DIAGNOSIS — I739 Peripheral vascular disease, unspecified: Secondary | ICD-10-CM

## 2012-03-21 DIAGNOSIS — I878 Other specified disorders of veins: Secondary | ICD-10-CM

## 2012-03-21 MED ORDER — ROPINIROLE HCL 2 MG PO TABS: 4.0000 mg | ORAL_TABLET | Freq: Every day | ORAL | Status: DC

## 2012-03-21 NOTE — H&P (Signed)
NAMEMARGARETE, HORACE NO.:  [id number]  MEDICAL RECORD NO.:  [id number]  LOCATION:  FOOT                         FACILITY:  [facility]  PHYSICIAN:  Joanne Gavel, M.D.        DATE OF BIRTH:  04-13-1945  DATE OF ADMISSION:  03/20/2012 DATE OF DISCHARGE:                             HISTORY & PHYSICAL   CHIEF COMPLAINT:  Wounds, both legs.  HISTORY OF PRESENT ILLNESS:  This patient has had swelling, redness, and fluid leakage from both legs for approximately 2 years.  She has been treated several times and is found to be allergic to vanc.  Her recent treatment has been washing and some ointments.  PAST MEDICAL HISTORY:  Significant for restless legs syndrome, hypertension, kidney stones, obesity, peripheral neuropathy, chronic heart failure, and diabetes type 2.  PAST SURGERY:  Includes tonsillectomy, __________ nose surgery, umbilical hernia, laparoscopy, and gallbladder excision.  ALLERGIES:  INCLUDES SULFA, MORPHINE, AND ZINC.  MEDICATIONS:  Lovaza, Coreg, Apidra, Lantus, Lasix, gabapentin, Klor- Con, ropinirole, Augmentin, and oxycodone.  SOCIAL HISTORY:  Cigarettes, none.  Alcohol, none.  REVIEW OF SYSTEMS:  As above.  PHYSICAL EXAMINATION:  VITAL SIGNS:  Temperature 98.2, pulse 76, respirations 22, blood pressure 151/63.  Glucose is 124.  The patient's stated weight is 270 pounds. HEAD EYES EARS, THROAT:  Normal. CHEST:  Clear. HEART:  Regular rhythm. ABDOMEN:  Not examined. EXTREMITIES:  Reveals a great deal of swelling of both legs including feet.  Peripheral pulses are not palpable.  There are multiple small superficial draining wounds on both right and left anterior forelegs.  IMPRESSION:  Chronic venous hypertension with stasis ulcerations complicated by diabetes mellitus and obesity.  PLAN:  For treatment, we will start with silver alginate and Profore Lite.  We will order vascular studies.  Culture has been taken.     Joanne Gavel,  M.D.     RA/MEDQ  D:  03/20/2012  T:  03/20/2012  Job:  [id number]

## 2012-03-23 DIAGNOSIS — I509 Heart failure, unspecified: Secondary | ICD-10-CM | POA: Diagnosis not present

## 2012-03-23 DIAGNOSIS — I872 Venous insufficiency (chronic) (peripheral): Secondary | ICD-10-CM | POA: Diagnosis not present

## 2012-03-23 DIAGNOSIS — I5031 Acute diastolic (congestive) heart failure: Secondary | ICD-10-CM | POA: Diagnosis not present

## 2012-03-23 DIAGNOSIS — I10 Essential (primary) hypertension: Secondary | ICD-10-CM | POA: Diagnosis not present

## 2012-03-23 DIAGNOSIS — L97909 Non-pressure chronic ulcer of unspecified part of unspecified lower leg with unspecified severity: Secondary | ICD-10-CM | POA: Diagnosis not present

## 2012-03-23 DIAGNOSIS — E109 Type 1 diabetes mellitus without complications: Secondary | ICD-10-CM | POA: Diagnosis not present

## 2012-03-27 ENCOUNTER — Encounter (HOSPITAL_BASED_OUTPATIENT_CLINIC_OR_DEPARTMENT_OTHER): Payer: Medicare Other | Attending: General Surgery

## 2012-03-27 DIAGNOSIS — I87319 Chronic venous hypertension (idiopathic) with ulcer of unspecified lower extremity: Secondary | ICD-10-CM | POA: Insufficient documentation

## 2012-03-27 DIAGNOSIS — I872 Venous insufficiency (chronic) (peripheral): Secondary | ICD-10-CM | POA: Diagnosis not present

## 2012-03-27 DIAGNOSIS — L97909 Non-pressure chronic ulcer of unspecified part of unspecified lower leg with unspecified severity: Secondary | ICD-10-CM | POA: Diagnosis not present

## 2012-03-29 DIAGNOSIS — E119 Type 2 diabetes mellitus without complications: Secondary | ICD-10-CM | POA: Diagnosis not present

## 2012-03-29 DIAGNOSIS — R7309 Other abnormal glucose: Secondary | ICD-10-CM | POA: Diagnosis not present

## 2012-04-02 DIAGNOSIS — G894 Chronic pain syndrome: Secondary | ICD-10-CM | POA: Diagnosis not present

## 2012-04-02 DIAGNOSIS — IMO0001 Reserved for inherently not codable concepts without codable children: Secondary | ICD-10-CM | POA: Diagnosis not present

## 2012-04-02 DIAGNOSIS — M79609 Pain in unspecified limb: Secondary | ICD-10-CM | POA: Diagnosis not present

## 2012-04-02 DIAGNOSIS — Z79899 Other long term (current) drug therapy: Secondary | ICD-10-CM | POA: Diagnosis not present

## 2012-04-03 DIAGNOSIS — I10 Essential (primary) hypertension: Secondary | ICD-10-CM | POA: Diagnosis not present

## 2012-04-03 DIAGNOSIS — L97909 Non-pressure chronic ulcer of unspecified part of unspecified lower leg with unspecified severity: Secondary | ICD-10-CM | POA: Diagnosis not present

## 2012-04-03 DIAGNOSIS — E109 Type 1 diabetes mellitus without complications: Secondary | ICD-10-CM | POA: Diagnosis not present

## 2012-04-03 DIAGNOSIS — I509 Heart failure, unspecified: Secondary | ICD-10-CM | POA: Diagnosis not present

## 2012-04-03 DIAGNOSIS — I5031 Acute diastolic (congestive) heart failure: Secondary | ICD-10-CM | POA: Diagnosis not present

## 2012-04-03 DIAGNOSIS — I872 Venous insufficiency (chronic) (peripheral): Secondary | ICD-10-CM | POA: Diagnosis not present

## 2012-04-05 ENCOUNTER — Encounter (HOSPITAL_COMMUNITY): Payer: Medicare Other

## 2012-04-10 DIAGNOSIS — I87319 Chronic venous hypertension (idiopathic) with ulcer of unspecified lower extremity: Secondary | ICD-10-CM | POA: Diagnosis not present

## 2012-04-10 DIAGNOSIS — I872 Venous insufficiency (chronic) (peripheral): Secondary | ICD-10-CM | POA: Diagnosis not present

## 2012-04-12 ENCOUNTER — Other Ambulatory Visit: Payer: Self-pay | Admitting: Orthopedic Surgery

## 2012-04-12 DIAGNOSIS — M25569 Pain in unspecified knee: Secondary | ICD-10-CM

## 2012-04-15 ENCOUNTER — Ambulatory Visit
Admission: RE | Admit: 2012-04-15 | Discharge: 2012-04-15 | Disposition: A | Discharge status: Home / Self Care | Payer: Medicare Other | Source: Ambulatory Visit | Attending: Orthopedic Surgery | Admitting: Orthopedic Surgery

## 2012-04-15 DIAGNOSIS — M12169 Kaschin-Beck disease, unspecified knee: Secondary | ICD-10-CM | POA: Diagnosis not present

## 2012-04-15 DIAGNOSIS — M23305 Other meniscus derangements, unspecified medial meniscus, unspecified knee: Secondary | ICD-10-CM | POA: Diagnosis not present

## 2012-04-15 DIAGNOSIS — M1710 Unilateral primary osteoarthritis, unspecified knee: Secondary | ICD-10-CM | POA: Diagnosis not present

## 2012-04-15 DIAGNOSIS — M25569 Pain in unspecified knee: Secondary | ICD-10-CM

## 2012-04-16 ENCOUNTER — Ambulatory Visit: Payer: Medicare Other | Admitting: Cardiology

## 2012-04-18 ENCOUNTER — Other Ambulatory Visit: Payer: Self-pay | Admitting: Internal Medicine

## 2012-04-20 DIAGNOSIS — L97909 Non-pressure chronic ulcer of unspecified part of unspecified lower leg with unspecified severity: Secondary | ICD-10-CM | POA: Diagnosis not present

## 2012-04-20 DIAGNOSIS — E109 Type 1 diabetes mellitus without complications: Secondary | ICD-10-CM | POA: Diagnosis not present

## 2012-04-20 DIAGNOSIS — I509 Heart failure, unspecified: Secondary | ICD-10-CM | POA: Diagnosis not present

## 2012-04-20 DIAGNOSIS — I10 Essential (primary) hypertension: Secondary | ICD-10-CM | POA: Diagnosis not present

## 2012-04-20 DIAGNOSIS — I872 Venous insufficiency (chronic) (peripheral): Secondary | ICD-10-CM | POA: Diagnosis not present

## 2012-04-20 DIAGNOSIS — I5031 Acute diastolic (congestive) heart failure: Secondary | ICD-10-CM | POA: Diagnosis not present

## 2012-04-24 ENCOUNTER — Encounter (HOSPITAL_BASED_OUTPATIENT_CLINIC_OR_DEPARTMENT_OTHER): Payer: Medicare Other | Attending: General Surgery

## 2012-04-27 ENCOUNTER — Inpatient Hospital Stay (HOSPITAL_COMMUNITY): Admission: RE | Admit: 2012-04-27 | Payer: Medicare Other | Source: Ambulatory Visit

## 2012-04-27 ENCOUNTER — Encounter (HOSPITAL_COMMUNITY): Payer: Medicare Other

## 2012-04-30 DIAGNOSIS — I872 Venous insufficiency (chronic) (peripheral): Secondary | ICD-10-CM | POA: Diagnosis not present

## 2012-04-30 DIAGNOSIS — I509 Heart failure, unspecified: Secondary | ICD-10-CM | POA: Diagnosis not present

## 2012-04-30 DIAGNOSIS — E109 Type 1 diabetes mellitus without complications: Secondary | ICD-10-CM | POA: Diagnosis not present

## 2012-04-30 DIAGNOSIS — I5031 Acute diastolic (congestive) heart failure: Secondary | ICD-10-CM | POA: Diagnosis not present

## 2012-04-30 DIAGNOSIS — L97909 Non-pressure chronic ulcer of unspecified part of unspecified lower leg with unspecified severity: Secondary | ICD-10-CM | POA: Diagnosis not present

## 2012-04-30 DIAGNOSIS — I10 Essential (primary) hypertension: Secondary | ICD-10-CM | POA: Diagnosis not present

## 2012-05-02 DIAGNOSIS — E119 Type 2 diabetes mellitus without complications: Secondary | ICD-10-CM | POA: Diagnosis not present

## 2012-05-02 DIAGNOSIS — E8881 Metabolic syndrome: Secondary | ICD-10-CM | POA: Diagnosis not present

## 2012-05-02 DIAGNOSIS — R7309 Other abnormal glucose: Secondary | ICD-10-CM | POA: Diagnosis not present

## 2012-05-03 ENCOUNTER — Telehealth: Payer: Self-pay | Admitting: Internal Medicine

## 2012-05-03 NOTE — Telephone Encounter (Signed)
Patient Information:  Caller Name: [name]  Phone: [phone]  Patient: Melissa Myers, Melissa Myers  Gender: Female  DOB: 22-Dec-1945  Age: 67 Years  PCP: Rene Paci (Adults only)  Office Follow Up:  Does the office need to follow up with this patient?: No  Instructions For The Office: N/A  RN Note:  Called regarding increasing pain from diabetic neuropathy with severe pain (rated 8/10) in Left foot and edema. FBS 113 at 0600.  [facility], Dr Everette Rank, doubled Gabapentin to 600 mg TID on 05/02/12 with no change to symptoms except worsening restless leg symptoms.  MRI of left knee 04/22/12; diagnosed with torn ligament and arthritis. Right foot has black area on toes for > 1 month which she thinks is related to "cheap" shoes.  Endocrinologist noted strong pedal pulse at office visit 05/02/12. Due to have ultrasound of all blood vessels from waist to feet 05/07/12. States is not going to ED because she is "miserable;" cannot walk or sit. Requested appointment for 05/04/12. Advised if won't go to ED, to go to Urgent care; stated she will consider it.  Symptoms  Reason For Call & Symptoms: Diabetic neuropathy in feet with insomnia, buring/tingling and sensation of marbles in feet.  Reviewed Health History In EMR: Yes  Reviewed Medications In EMR: Yes  Reviewed Allergies In EMR: Yes  Reviewed Surgeries / Procedures: Yes  Date of Onset of Symptoms: 04/04/2012  Treatments Tried: wrapping legs  Treatments Tried Worked: No  Guideline(s) Used:  Foot Pain  Disposition Per Guideline:   Go to ED Now  Reason For Disposition Reached:   Purple or black skin on foot or toe  Advice Given:  N/A  Patient Refused Recommendation:  Patient Will Follow Up With Office Later  Advised ED or UC; will "consider it" but likely will call for appointment 05/04/12.

## 2012-05-04 DIAGNOSIS — E109 Type 1 diabetes mellitus without complications: Secondary | ICD-10-CM | POA: Diagnosis not present

## 2012-05-04 DIAGNOSIS — I872 Venous insufficiency (chronic) (peripheral): Secondary | ICD-10-CM | POA: Diagnosis not present

## 2012-05-04 DIAGNOSIS — I509 Heart failure, unspecified: Secondary | ICD-10-CM | POA: Diagnosis not present

## 2012-05-04 DIAGNOSIS — I10 Essential (primary) hypertension: Secondary | ICD-10-CM | POA: Diagnosis not present

## 2012-05-04 DIAGNOSIS — I5031 Acute diastolic (congestive) heart failure: Secondary | ICD-10-CM | POA: Diagnosis not present

## 2012-05-04 DIAGNOSIS — L97909 Non-pressure chronic ulcer of unspecified part of unspecified lower leg with unspecified severity: Secondary | ICD-10-CM | POA: Diagnosis not present

## 2012-05-07 ENCOUNTER — Ambulatory Visit (HOSPITAL_COMMUNITY)
Admission: RE | Admit: 2012-05-07 | Discharge: 2012-05-07 | Disposition: A | Discharge status: Home / Self Care | Payer: Medicare Other | Source: Ambulatory Visit | Attending: Cardiovascular Disease | Admitting: Cardiovascular Disease

## 2012-05-07 ENCOUNTER — Ambulatory Visit (HOSPITAL_COMMUNITY)
Admission: RE | Admit: 2012-05-07 | Discharge: 2012-05-07 | Disposition: A | Discharge status: Home / Self Care | Payer: Medicare Other | Source: Ambulatory Visit | Attending: General Surgery | Admitting: General Surgery

## 2012-05-07 ENCOUNTER — Other Ambulatory Visit (HOSPITAL_COMMUNITY): Payer: Self-pay | Admitting: General Surgery

## 2012-05-07 DIAGNOSIS — M7989 Other specified soft tissue disorders: Secondary | ICD-10-CM | POA: Diagnosis not present

## 2012-05-07 DIAGNOSIS — I872 Venous insufficiency (chronic) (peripheral): Secondary | ICD-10-CM | POA: Insufficient documentation

## 2012-05-07 DIAGNOSIS — I739 Peripheral vascular disease, unspecified: Secondary | ICD-10-CM

## 2012-05-07 DIAGNOSIS — I878 Other specified disorders of veins: Secondary | ICD-10-CM

## 2012-05-07 DIAGNOSIS — M79609 Pain in unspecified limb: Secondary | ICD-10-CM | POA: Diagnosis not present

## 2012-05-07 NOTE — Progress Notes (Signed)
Lower extremity arterial duplex completed Larene Pickett RVT

## 2012-05-08 DIAGNOSIS — I509 Heart failure, unspecified: Secondary | ICD-10-CM | POA: Diagnosis not present

## 2012-05-08 DIAGNOSIS — E109 Type 1 diabetes mellitus without complications: Secondary | ICD-10-CM | POA: Diagnosis not present

## 2012-05-08 DIAGNOSIS — I5031 Acute diastolic (congestive) heart failure: Secondary | ICD-10-CM | POA: Diagnosis not present

## 2012-05-08 DIAGNOSIS — L97909 Non-pressure chronic ulcer of unspecified part of unspecified lower leg with unspecified severity: Secondary | ICD-10-CM | POA: Diagnosis not present

## 2012-05-08 DIAGNOSIS — I10 Essential (primary) hypertension: Secondary | ICD-10-CM | POA: Diagnosis not present

## 2012-05-08 DIAGNOSIS — I872 Venous insufficiency (chronic) (peripheral): Secondary | ICD-10-CM | POA: Diagnosis not present

## 2012-05-09 DIAGNOSIS — I509 Heart failure, unspecified: Secondary | ICD-10-CM | POA: Diagnosis not present

## 2012-05-09 DIAGNOSIS — I872 Venous insufficiency (chronic) (peripheral): Secondary | ICD-10-CM | POA: Diagnosis not present

## 2012-05-09 DIAGNOSIS — L97909 Non-pressure chronic ulcer of unspecified part of unspecified lower leg with unspecified severity: Secondary | ICD-10-CM | POA: Diagnosis not present

## 2012-05-09 DIAGNOSIS — E109 Type 1 diabetes mellitus without complications: Secondary | ICD-10-CM | POA: Diagnosis not present

## 2012-05-09 DIAGNOSIS — I10 Essential (primary) hypertension: Secondary | ICD-10-CM | POA: Diagnosis not present

## 2012-05-09 DIAGNOSIS — I5031 Acute diastolic (congestive) heart failure: Secondary | ICD-10-CM | POA: Diagnosis not present

## 2012-05-11 ENCOUNTER — Encounter: Payer: Self-pay | Admitting: Internal Medicine

## 2012-05-11 ENCOUNTER — Ambulatory Visit (INDEPENDENT_AMBULATORY_CARE_PROVIDER_SITE_OTHER): Payer: Medicare Other | Admitting: Internal Medicine

## 2012-05-11 VITALS — BP 142/60 | HR 67 | Temp 97.4°F | Wt 269.2 lb

## 2012-05-11 DIAGNOSIS — I8310 Varicose veins of unspecified lower extremity with inflammation: Secondary | ICD-10-CM | POA: Diagnosis not present

## 2012-05-11 DIAGNOSIS — M79609 Pain in unspecified limb: Secondary | ICD-10-CM

## 2012-05-11 DIAGNOSIS — G609 Hereditary and idiopathic neuropathy, unspecified: Secondary | ICD-10-CM

## 2012-05-11 DIAGNOSIS — M79604 Pain in right leg: Secondary | ICD-10-CM

## 2012-05-11 DIAGNOSIS — G629 Polyneuropathy, unspecified: Secondary | ICD-10-CM

## 2012-05-11 MED ORDER — PREGABALIN 50 MG PO CAPS: 50.0000 mg | ORAL_CAPSULE | Freq: Three times a day (TID) | ORAL | Status: DC

## 2012-05-11 NOTE — Assessment & Plan Note (Signed)
multifactorial related to chronic dermatitis, acute on chronic swelling, and neuropathic component as well Normal ABIs, neg dopplers

## 2012-05-11 NOTE — Assessment & Plan Note (Signed)
Complicated by severe lymphedema - high dose loop diuretic with prior prn zaroxyln Continue high dose diuretic - follow up with CHF cards and [facility] Dx confirmed from [facility] derm 2nd opinion 08/2011 - also following with wound clinic encourgaed to comply with [facility] for una boots and xeroform for open ulcers 2-3x/wk -  anticipate for life per wound care experts [facility] consult reviewed)

## 2012-05-11 NOTE — Patient Instructions (Signed)
It was good to see you today. stop gabapentin Start Lyrica for pain Your prescription has been given to you to submit to your pharmacy. Please take as directed and contact our office if you believe you are having problem(s) with the medication(s). Other Medications reviewed, no changes at this time. Continue working with [facility] and wound care clinic - also wih [place] cardiology as planned

## 2012-05-11 NOTE — Progress Notes (Signed)
Subjective:    Patient ID: Melissa Myers, female    DOB: 1945/04/16, 67 y.o.   MRN: [id number]  Leg Pain    also reviewed chronic medical issues:  Venous stasis dermatitis - severe distal BLE - eval at [facility] summer 2013 confirmed same - Onset Spring 2012, intermittent but gradually worsening. Unresolved with various courses of antibiotics or topical steroids. Associated with itching. No other rash on body. S/p local derm eval in 2012 and 2nd opinion [facility] 08/2011 = same diagnosis -temporarily improved swelling on zaroxyln in addition to demadex (2 weeks tx mid 09/2011)  Dyspnea on exertion. Unchanged and chronic; Worse with increased weight gain and when laying flat. Not improved with increased diuretics. CT negative for PE on January 03, 2011 in ER, neg RHC 03/2011 -   Insomnia - manifest as restless legs for which she takes Requip, but little relief Falls asleep during daytime, prior events while driving reviewed Feels "smothered" when laying supine Prior sleep study 2011 "did not show apnea" but followup evaluation December 2012 feels sleep apnea highly probable based on body habitus - unable to tol home CPAP split study  DM2 -variable compliance with prescribed medications; intolerant of metformin due to GI side effects. Does not regularly check sugars. No longer following with endo Sharl Ma) due to complications/side effects with levemir, on Lantus since 09/2011 and s/p [facility] endo eval  Low back pain, chronic. Worse with exertion, relieved with sitting or rest. Denies trauma or falls. No radiation of pain. No fever or urinary change. No relief with over-the-counter medications.  Past Medical History  Diagnosis Date  . Type II or unspecified type diabetes mellitus without mention of complication, not stated as uncontrolled     insulin dep  . Hyperlipidemia     hx rhabdo on statins  . Hypertension   . CHF (congestive heart failure)     diastolic  . OSA (obstructive sleep apnea)    insignificant apnea events 05/2009 sleep study  . Chronic diarrhea     a/w nausea - felt related to IBS  . RLS (restless legs syndrome)   . Osteoarthritis   . Stasis dermatitis      Review of Systems  Constitutional: Positive for fatigue. Negative for fever and unexpected weight change.  Respiratory: Positive for shortness of breath (chronic). Negative for cough, chest tightness and wheezing.   Cardiovascular: Negative for chest pain and palpitations. Leg swelling: chronic.  Musculoskeletal: Positive for back pain.  Skin: Positive for rash. Negative for wound.       Objective:   Physical Exam  BP 142/60  Pulse 67  Temp(Src) 97.4 F (36.3 C) (Oral)  Wt 269 lb 3.2 oz (122.108 kg)  BMI 52.57 kg/m2  SpO2 94% Wt Readings from Last 3 Encounters:  05/11/12 269 lb 3.2 oz (122.108 kg)  01/05/12 269 lb 12.8 oz (122.38 kg)  12/30/11 265 lb (120.203 kg)   Constitutional: She is overweight; appears well-developed and well-nourished. No distress. mom at side Neck: Thick; Normal range of motion. Neck supple. No JVD present. No thyromegaly present.  Cardiovascular: Normal rate, regular rhythm and normal heart sounds.  No murmur heard. chronic tight/woody changes with 1+ BLE edema Pulmonary/Chest: Effort normal and breath sounds diminished at bases bilaterally. No respiratory distress. She has no wheezes.  Skin: chronic thickening and mild erythema of distal BLE, stasis dermatitis without change - resolution of pustules on anterior shin - RLE distal leg with single ulceration medially above malleolus, LLE distally with cracking but  no ulceration. Evidence for excoriation - Psychiatric: She has a dysthymic mood and affect. Her behavior is normal. Judgment and thought content normal.       Lab Results  Component Value Date   WBC 8.8 12/15/2011   HGB 10.5* 12/15/2011   HCT 33.8* 12/15/2011   PLT 369 12/15/2011   CHOL 243* 02/28/2011   TRIG 281.0* 02/28/2011   HDL 53.10 02/28/2011   LDLDIRECT  120.9 02/28/2011   ALT 27 07/21/2010   AST 36 07/21/2010   NA 138 12/15/2011   K 4.1 12/15/2011   CL 96 12/15/2011   CREATININE 0.85 12/15/2011   BUN 25* 12/15/2011   CO2 30 12/15/2011   TSH 1.35 05/29/2009   INR 1.00 04/15/2011   HGBA1C 8.7* 10/05/2011    Assessment & Plan:  See problem list. Medications and labs reviewed today.  Neuropathic leg pain - severe neuropathy per reports from [facility] - Change gaba 600 TID to lyrica 50 TID Ok to continue oxy prn Advised continued work with [facility] and wound clinic for same

## 2012-05-15 ENCOUNTER — Telehealth: Payer: Self-pay | Admitting: Internal Medicine

## 2012-05-15 DIAGNOSIS — L97909 Non-pressure chronic ulcer of unspecified part of unspecified lower leg with unspecified severity: Secondary | ICD-10-CM | POA: Diagnosis not present

## 2012-05-15 DIAGNOSIS — I10 Essential (primary) hypertension: Secondary | ICD-10-CM | POA: Diagnosis not present

## 2012-05-15 DIAGNOSIS — E109 Type 1 diabetes mellitus without complications: Secondary | ICD-10-CM | POA: Diagnosis not present

## 2012-05-15 DIAGNOSIS — I872 Venous insufficiency (chronic) (peripheral): Secondary | ICD-10-CM | POA: Diagnosis not present

## 2012-05-15 DIAGNOSIS — I5031 Acute diastolic (congestive) heart failure: Secondary | ICD-10-CM | POA: Diagnosis not present

## 2012-05-15 DIAGNOSIS — I509 Heart failure, unspecified: Secondary | ICD-10-CM | POA: Diagnosis not present

## 2012-05-15 NOTE — Telephone Encounter (Signed)
Kim with [facility] is calling to see if the patient needs to be seen tomorrow, she was seen in the home today and both of her calves have increased redness and they are warm to the touch, call patient to schedule if appropriate

## 2012-05-16 ENCOUNTER — Ambulatory Visit: Payer: Medicare Other | Admitting: Cardiology

## 2012-05-16 ENCOUNTER — Encounter: Payer: Self-pay | Admitting: Cardiology

## 2012-05-16 ENCOUNTER — Ambulatory Visit (INDEPENDENT_AMBULATORY_CARE_PROVIDER_SITE_OTHER): Payer: Medicare Other | Admitting: Cardiology

## 2012-05-16 VITALS — BP 130/52 | HR 62 | Ht 60.0 in | Wt 274.0 lb

## 2012-05-16 DIAGNOSIS — E669 Obesity, unspecified: Secondary | ICD-10-CM

## 2012-05-16 DIAGNOSIS — I5032 Chronic diastolic (congestive) heart failure: Secondary | ICD-10-CM

## 2012-05-16 DIAGNOSIS — I25810 Atherosclerosis of coronary artery bypass graft(s) without angina pectoris: Secondary | ICD-10-CM

## 2012-05-16 DIAGNOSIS — R0602 Shortness of breath: Secondary | ICD-10-CM

## 2012-05-16 DIAGNOSIS — I5022 Chronic systolic (congestive) heart failure: Secondary | ICD-10-CM | POA: Diagnosis not present

## 2012-05-16 MED ORDER — TORSEMIDE 20 MG PO TABS: ORAL_TABLET | ORAL | Status: DC

## 2012-05-16 NOTE — Telephone Encounter (Signed)
Elevation recommended, no antibiotics  - OV if increasing symptoms

## 2012-05-16 NOTE — Patient Instructions (Addendum)
Stop lasix (furosemide).  Start torsemide 80 mg daily. This will be 4 of your 20mg  tablets daily at the same time in the morning.   Your physician recommends that you return for lab work in: 10 days--BMET/BNP  Your physician has requested that you have an echocardiogram. Echocardiography is a painless test that uses sound waves to create images of your heart. It provides your doctor with information about the size and shape of your heart and how well your heart's chambers and valves are working. This procedure takes approximately one hour. There are no restrictions for this procedure.  Your physician recommends that you schedule a follow-up appointment about 2 weeks after your testing has been completed.

## 2012-05-16 NOTE — Telephone Encounter (Signed)
Notified pt with ms response...Melissa Myers

## 2012-05-17 NOTE — Progress Notes (Signed)
Patient ID: Melissa Myers, female   DOB: [birth year], 67 y.o.   MRN: [id number] PCP: Dr. Felicity Coyer  67 yo with history of HTN, DM, hyperlipidemia, obesity, and chronic dyspnea/diastolic CHF returns for cardiac evaluation. Patient had an echo in 3/11 showing moderate LVH and preserved LV systolic function. However, there was a very large LV mid-cavity gradient with valsalva.  Patient developed quite significant exertional dyspnea to the point where she was short of breath walking around her house. She had a pulmonary evaluation with Dr. Vassie Loll but no primary lung problems were identified. Patient had an ETT-myoview in 4/11 which was negative for ischemia or infarction. Right heart cath in 4/11 showed mildly elevated right heart filling pressures but normal PA pressure and normal PCWP. I started her on a beta blocker (Coreg) to try to lower her LV mid-cavity gradient (that likely occurs with exertion) and to better control BP.  She had a recent repeat echo in 11/12 with normal EF.  No significant LV mid-cavity gradient was described on this echo.  V/Q scan was negative for PE.  PFTs from 5/13 showed a restrictive defect.   I have not seen her since 3/13.  She was seen by Dr. Gala Romney and then apparently went to [facility] to see a cardiologist last fall.  Apparently they wanted to admit her at [facility].  I have no records.  She tells me that her weight has progressively risen and she has become progressively limited.  She is up 20 lbs compared to when I saw her about a year ago.  She was supposed to be taking Lasix 80 mg bid (last appointment with Dr. Gala Romney) but she is only taking it once a day.  She walks with a walker primarily due to left knee and back pain.  She is not short of breath if she runs slowly.  Her legs are swollen and she has stasis dermatitis.  No orthopnea/PND.   No chest pain.  Venous dopplers in 3/14 showed no DVT.   ECG: NSR, RBBB  Labs (4/11): TGs 539, HDL 58, LDL 128, K 5.2, creatinine 1.3,  TSH normal, BNP 14  Labs (5/11): K 3.9, creatinine 1.0  Labs (9/11): creatinine 1.1, BUN 43, LDL 145, HDL 60  Labs (10/11): K 4.3, creatinine 0.81, HCT 34, LDL 115, HDL 75  Labs (11/12): K 4.2, creatinine 1.58 => 3.0, HCT 32.2, BNP 172, LDL 97, TGs 980 Labs (12/12): K 3.4, creatinine 1.2 => 1.7 Labs (1/13): K 3.9, creatinine 1.1, TGs 281, LDL 121 Labs (2/13): K 3.3, creatinine 1.2 Labs (10/13): K 4.1, creatinine 0.85  Allergies (verified):  1) ! Sulfa  2) Morphine   Past Medical History:  1. Diabetes mellitus, type II  2. Hyperlipidemia: She has been unable to tolerate statins (has been on Vytorin, lovastatin, and Crestor) due to what sounds like rhabdo: developed muscle weakness and was told she had "muscle damage" with each of these statins. She has been told not to take statins anymore.  3. Hypertension: prev was told she had "white coat" HTN  4. Obesity  5. Restless Leg Syndrome since age 72yo  6. OA  7. peripheral neuropathy  8. OSA: Mild, does not use CPAP  9. Chronic nausea/diarrhea: ? IBS  10. Diastolic CHF: Echo (3/11) with normal LV size, moderate LVH, EF 65-70%, LV mid-cavity gradient reaching 63 mmHg with valsalva but minimal at rest, grade I diastolic dysfunction, cannot estimate PA systolic pressure (no TR doppler signal), RV normal. RHC (4/11): Mean RA  11, RV 37/13, PA 33/15, mean PCWP 12, CI 3.3. Repeat echo in 11/12 showed mild LVH, EF 65-70%, no LV mid-cavity gradient was mentioned.   11. ETT-myoview (4/11): 5'30", stopped due to fatigue, normal EF, no evidence for scar or ischemia. 12. V/Q scan 11/12: negative for PE.  Lower extremity venous doppler US (3/14): negative for DVT.  13. PFTs (5/13): FVC 50%, FEV1 62%, ratio 106%, DLCO 69%, TLC 69%.   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   ROS: All systems reviewed and negative except as per HPI.   Current Outpatient Prescriptions  Medication Sig Dispense Refill  . aspirin 81 MG tablet Take 81 mg by mouth daily.       . carvedilol (COREG) 25 MG tablet Take 1 tablet (25 mg total) by mouth 2 (two) times daily with a meal.  180 tablet  3  . cholecalciferol (VITAMIN D) 400 UNITS TABS Take 400 Units by mouth daily.      . Coenzyme Q10 (COQ10) 30 MG CAPS Take 1 capsule by mouth daily.       . Cyanocobalamin (VITAMIN B-12 PO) Take 1 tablet by mouth daily.      . Diphenhyd-Hydrocort-Nystatin SUSP Use as directed 5 mLs in the mouth or throat 4 (four) times daily as needed.  200 mL  0  . fenofibrate 160 MG tablet Take 1 tablet (160 mg total) by mouth daily.  30 tablet  5  . flyticasone (CUTIVATE) 0.005 % ointment Apply topically 3 (three) times daily as needed. For itching  60 g  2  . GABAPENTIN, PHN, PO Take 3 tablets by mouth 3 (three) times daily.      . Garlic 1000 MG CAPS Take 1 capsule by mouth daily.      . hydrALAZINE (APRESOLINE) 50 MG tablet Take 1 tablet (50 mg total) by mouth 3 (three) times daily.  90 tablet  6  . insulin glargine (LANTUS) 100 UNIT/ML injection Inject 60 Units into the skin at bedtime.  10 mL  12  . insulin glulisine (APIDRA) 100 UNIT/ML injection Inject into the skin 3 (three) times daily before meals. 29-28-27      . LOVAZA 1 G capsule TAKE 2 CAPSULES BY MOUTH TWICE DAILY  120 capsule  5  . nystatin (MYCOSTATIN) powder Apply  topically 4 (four) times daily.  60 g  1  . ONE TOUCH ULTRA TEST test strip USE TO CHECK BLOOD GLUCOSE TWICE DAILY  100 each  0  . oxyCODONE-acetaminophen (PERCOCET/ROXICET) 5-325 MG per tablet 1-2 tabs every 6 hours as needed for pain  60 tablet  0  . potassium chloride (KLOR-CON 10) 10 MEQ tablet Take 3 tablets in the morning and 2 tablets in the evening  150 tablet  6  . rOPINIRole (REQUIP) 2 MG tablet Take 2 tablets (4 mg  total) by mouth at bedtime.  60 tablet  2  . torsemide (DEMADEX) 20 MG tablet Take 4 tablets (total 80mg ) daily at the same time in the morning  120 tablet  3   No current facility-administered medications for this visit.    BP 130/52  Pulse 62  Ht 5' (1.524 m)  Wt 274 lb (124.286 kg)  BMI 53.51 kg/m2 General: NAD, obese Neck: Thick, JVP 10 cm, no thyromegaly or thyroid nodule.  Lungs: Clear to auscultation bilaterally with normal respiratory effort. CV: Nondisplaced PMI.  Heart regular S1/S2, no S3/S4, 2/6 SEM RUSB.  2+ chronic edema to knees bilaterally.  No carotid bruit.  Abdomen: Soft, nontender, no hepatosplenomegaly, no distention.  Skin: Venous stasis changes lower legs bilaterally.    Neurologic: Alert and oriented x 3.  Psych: Normal affect. Extremities: No clubbing or cyanosis.   Assessment/Plan: 1. Chronic diastolic CHF: Patient has gained weight progressively.  She is volume overloaded on exam today.  Venous insufficiency almost certainly contributes to her lower leg edema (also consider role for lymphedema).  However, she appears to have elevated JVP as well. She has a systolic murmur that corresponds to the LV mid-cavity gradient seen on prior echoes.  - I will repeat an echo to reassess LV EF, RV function, and LV mid-cavity gradient.   - Stop Lasix and start torsemide 80 mg daily.  - She will take KCl 30 qam, 20 qpm.  - BMET/BNP in 10 days with followup in the office in 2 wks.   - Continue Coreg to try to limit LV mid-cavity gradient.  2.  Pulmonary: I suspect that her primary problem is diastolic CHF along with obesity and deconditioning.  However, she had PFTs with a restrictive defect which could be due to obesity but also with  decreased DLCO since she last saw Dr. Vassie Loll.  I will have her return for pulmonary evaluation to make sure we are not missing anything.  3. Morbid obesity: Needs weight loss but very difficult given impaired mobility from arthritis.  4. Renal: History of AKI with aggressive diuresis.  Will need to watch creatinine carefully.   Marca Ancona 05/17/2012 6:33 AM

## 2012-05-18 DIAGNOSIS — I509 Heart failure, unspecified: Secondary | ICD-10-CM | POA: Diagnosis not present

## 2012-05-18 DIAGNOSIS — I5031 Acute diastolic (congestive) heart failure: Secondary | ICD-10-CM | POA: Diagnosis not present

## 2012-05-18 DIAGNOSIS — L97909 Non-pressure chronic ulcer of unspecified part of unspecified lower leg with unspecified severity: Secondary | ICD-10-CM | POA: Diagnosis not present

## 2012-05-18 DIAGNOSIS — I872 Venous insufficiency (chronic) (peripheral): Secondary | ICD-10-CM | POA: Diagnosis not present

## 2012-05-18 DIAGNOSIS — I10 Essential (primary) hypertension: Secondary | ICD-10-CM | POA: Diagnosis not present

## 2012-05-18 DIAGNOSIS — E109 Type 1 diabetes mellitus without complications: Secondary | ICD-10-CM | POA: Diagnosis not present

## 2012-05-22 DIAGNOSIS — I5031 Acute diastolic (congestive) heart failure: Secondary | ICD-10-CM | POA: Diagnosis not present

## 2012-05-22 DIAGNOSIS — L97909 Non-pressure chronic ulcer of unspecified part of unspecified lower leg with unspecified severity: Secondary | ICD-10-CM | POA: Diagnosis not present

## 2012-05-22 DIAGNOSIS — I509 Heart failure, unspecified: Secondary | ICD-10-CM | POA: Diagnosis not present

## 2012-05-22 DIAGNOSIS — I872 Venous insufficiency (chronic) (peripheral): Secondary | ICD-10-CM | POA: Diagnosis not present

## 2012-05-22 DIAGNOSIS — E109 Type 1 diabetes mellitus without complications: Secondary | ICD-10-CM | POA: Diagnosis not present

## 2012-05-22 DIAGNOSIS — I10 Essential (primary) hypertension: Secondary | ICD-10-CM | POA: Diagnosis not present

## 2012-05-25 ENCOUNTER — Other Ambulatory Visit (INDEPENDENT_AMBULATORY_CARE_PROVIDER_SITE_OTHER): Payer: Medicare Other

## 2012-05-25 ENCOUNTER — Ambulatory Visit (HOSPITAL_COMMUNITY): Payer: Medicare Other | Attending: Cardiology

## 2012-05-25 DIAGNOSIS — L97909 Non-pressure chronic ulcer of unspecified part of unspecified lower leg with unspecified severity: Secondary | ICD-10-CM | POA: Diagnosis not present

## 2012-05-25 DIAGNOSIS — I872 Venous insufficiency (chronic) (peripheral): Secondary | ICD-10-CM | POA: Diagnosis not present

## 2012-05-25 DIAGNOSIS — I10 Essential (primary) hypertension: Secondary | ICD-10-CM | POA: Diagnosis not present

## 2012-05-25 DIAGNOSIS — I509 Heart failure, unspecified: Secondary | ICD-10-CM

## 2012-05-25 DIAGNOSIS — I25810 Atherosclerosis of coronary artery bypass graft(s) without angina pectoris: Secondary | ICD-10-CM | POA: Insufficient documentation

## 2012-05-25 DIAGNOSIS — R0602 Shortness of breath: Secondary | ICD-10-CM | POA: Diagnosis not present

## 2012-05-25 DIAGNOSIS — I5022 Chronic systolic (congestive) heart failure: Secondary | ICD-10-CM

## 2012-05-25 DIAGNOSIS — I5031 Acute diastolic (congestive) heart failure: Secondary | ICD-10-CM | POA: Diagnosis not present

## 2012-05-25 DIAGNOSIS — E109 Type 1 diabetes mellitus without complications: Secondary | ICD-10-CM | POA: Diagnosis not present

## 2012-05-25 LAB — BASIC METABOLIC PANEL
BUN: 43 mg/dL — ABNORMAL HIGH (ref 6–23)
CO2: 30 mEq/L (ref 19–32)
Calcium: 9 mg/dL (ref 8.4–10.5)
GFR: 49.05 mL/min — ABNORMAL LOW (ref >60.00)
Glucose, Bld: 233 mg/dL — ABNORMAL HIGH (ref 70–99)
Potassium: 3.7 mEq/L (ref 3.5–5.1)

## 2012-05-25 NOTE — Progress Notes (Signed)
Echocardiogram performed.  

## 2012-05-29 ENCOUNTER — Encounter: Payer: Self-pay | Admitting: Cardiology

## 2012-05-29 ENCOUNTER — Ambulatory Visit (INDEPENDENT_AMBULATORY_CARE_PROVIDER_SITE_OTHER): Payer: Medicare Other | Admitting: Cardiology

## 2012-05-29 VITALS — BP 157/68 | HR 76 | Ht 60.0 in | Wt 266.8 lb

## 2012-05-29 DIAGNOSIS — I5022 Chronic systolic (congestive) heart failure: Secondary | ICD-10-CM

## 2012-05-29 DIAGNOSIS — I5032 Chronic diastolic (congestive) heart failure: Secondary | ICD-10-CM

## 2012-05-29 DIAGNOSIS — I25810 Atherosclerosis of coronary artery bypass graft(s) without angina pectoris: Secondary | ICD-10-CM | POA: Diagnosis not present

## 2012-05-29 DIAGNOSIS — E785 Hyperlipidemia, unspecified: Secondary | ICD-10-CM | POA: Diagnosis not present

## 2012-05-29 DIAGNOSIS — E669 Obesity, unspecified: Secondary | ICD-10-CM

## 2012-05-29 MED ORDER — CARVEDILOL 25 MG PO TABS: ORAL_TABLET | ORAL | Status: DC

## 2012-05-29 MED ORDER — POTASSIUM CHLORIDE ER 10 MEQ PO TBCR: EXTENDED_RELEASE_TABLET | ORAL | Status: DC

## 2012-05-29 NOTE — Patient Instructions (Addendum)
Increase coreg(carvedilol) to 37.5mg  two times a day. This will be 1 and 1/2 of your 25mg  tablets two times a day.  Increase KCL(potassium) to 30 mEq two times a day. This will be 3 of your 10 mEq tablets two times a day.  Take magnesium oxide 400mg  daily. Take 1 of a 200mg  tablet two times a day. You do not need a prescription for this.  Your physician recommends that you return for a FASTING lipid profile /magnesium level /BMET in 10 days.  Your physician recommends that you schedule a follow-up appointment in: 1 month with Dr Shirlee Latch.

## 2012-05-29 NOTE — Progress Notes (Signed)
Patient ID: Melissa Myers, female   DOB: 20-Jul-1945, 67 y.o.   MRN: [id number] PCP: Dr. Felicity Coyer  67 yo with history of HTN, DM, hyperlipidemia, obesity, and chronic dyspnea/diastolic CHF returns for cardiac evaluation. Patient had an echo in 3/11 showing moderate LVH and preserved LV systolic function. However, there was a very large LV mid-cavity gradient with valsalva.  Patient developed quite significant exertional dyspnea to the point where she was short of breath walking around her house. She had a pulmonary evaluation with Dr. Vassie Loll but no primary lung problems were identified. Patient had an ETT-myoview in 4/11 which was negative for ischemia or infarction. Right heart cath in 4/11 showed mildly elevated right heart filling pressures but normal PA pressure and normal PCWP. I started her on a beta blocker (Coreg) to try to lower her LV mid-cavity gradient (that likely occurs with exertion) and to better control BP.  V/Q scan was negative for PE.  PFTs from 5/13 showed a restrictive defect. She had a recent repeat echo in 4/14 with normal EF.  Mild mid-cavity LV gradient.     She walks with a walker primarily due to left knee and back pain.  She is not short of breath if she walks slowly.  Her legs are swollen and she has stasis dermatitis.  No orthopnea/PND.   No chest pain. At last visit, she appeared volume overloaded.  I changed Lasix over to torsemide.  Since then, she has lost 8 lbs and her lower extremity edema has decreased (but is still quite significant).  Creatinine is up to 1.2.  She has been having some leg cramps since the switch to torsemide.   Labs (4/11): TGs 539, HDL 58, LDL 128, K 5.2, creatinine 1.3, TSH normal, BNP 14  Labs (5/11): K 3.9, creatinine 1.0  Labs (9/11): creatinine 1.1, BUN 43, LDL 145, HDL 60  Labs (10/11): K 4.3, creatinine 0.81, HCT 34, LDL 115, HDL 75  Labs (11/12): K 4.2, creatinine 1.58 => 3.0, HCT 32.2, BNP 172, LDL 97, TGs 980 Labs (12/12): K 3.4,  creatinine 1.2 => 1.7 Labs (1/13): K 3.9, creatinine 1.1, TGs 281, LDL 121 Labs (2/13): K 3.3, creatinine 1.2 Labs (10/13): K 4.1, creatinine 0.85 Labs (4/14): K 3.7, creatinine 1.2, BUN 43, BNP 23  Allergies (verified):  1) ! Sulfa  2) Morphine   Past Medical History:  1. Diabetes mellitus, type II  2. Hyperlipidemia: She has been unable to tolerate statins (has been on Vytorin, lovastatin, and Crestor) due to what sounds like rhabdo: developed muscle weakness and was told she had "muscle damage" with each of these statins. She has been told not to take statins anymore.  3. Hypertension: prev was told she had "white coat" HTN  4. Obesity  5. Restless Leg Syndrome since age 21yo  6. OA  7. peripheral neuropathy  8. OSA: Mild, does not use CPAP  9. Chronic nausea/diarrhea: ? IBS  10. Diastolic CHF: Echo (3/11) with normal LV size, moderate LVH, EF 65-70%, LV mid-cavity gradient reaching 63 mmHg with valsalva but minimal at rest, grade I diastolic dysfunction, cannot estimate PA systolic pressure (no TR doppler signal), RV normal. RHC (4/11): Mean RA 11, RV 37/13, PA 33/15, mean PCWP 12, CI 3.3. Echo in 11/12 showed mild LVH, EF 65-70%, no LV mid-cavity gradient was mentioned.  Echo in 4/14 showed EF 65-70%, mild LVH, grade I diastolic dysfunction, PA systolic pressure 35 mmHg, mild LV mid-cavity gradient.  11. ETT-myoview (4/11): 5'30", stopped  due to fatigue, normal EF, no evidence for scar or ischemia. 12. V/Q scan 11/12: negative for PE.  Lower extremity venous doppler US (3/14): negative for DVT.  13. PFTs (5/13): FVC 50%, FEV1 62%, ratio 106%, DLCO 69%, TLC 69%.   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   ROS: All systems reviewed and negative except as per HPI.   Current Outpatient Prescriptions  Medication Sig Dispense Refill  . aspirin 81 MG tablet Take 81 mg by mouth daily.       . Coenzyme Q10 (COQ10) 30 MG CAPS Take 1 capsule by mouth daily.       . fenofibrate 160 MG tablet Take 1 tablet (160 mg total) by mouth daily.  30 tablet  5  . flyticasone (CUTIVATE) 0.005 % ointment Apply topically 3 (three) times daily as needed. For itching  60 g  2  . GABAPENTIN, PHN, PO Take 3 tablets by mouth 3 (three) times daily.      . hydrALAZINE (APRESOLINE) 50 MG tablet Take 1 tablet (50 mg total) by mouth 3 (three) times daily.  90 tablet  6  . insulin glargine (LANTUS) 100 UNIT/ML injection Inject 60 Units into the skin at bedtime.  10 mL  12  . insulin glulisine (APIDRA) 100 UNIT/ML injection Inject into the skin 3 (three) times daily before meals. 29-28-27      . LOVAZA 1 G capsule TAKE 2 CAPSULES BY MOUTH TWICE DAILY  120 capsule  5  . nystatin (MYCOSTATIN) powder Apply topically 4 (four) times daily.  60 g  1  . ONE TOUCH ULTRA TEST test strip USE TO CHECK BLOOD GLUCOSE TWICE DAILY  100 each  0  . oxyCODONE-acetaminophen (PERCOCET/ROXICET) 5-325 MG per tablet 1-2 tabs every 6 hours as needed for pain  60 tablet  0  . rOPINIRole (REQUIP) 2 MG tablet Take 2 tablets (4 mg total) by mouth at bedtime.  60 tablet  2  . torsemide (DEMADEX) 20 MG tablet Take 4 tablets (total 80mg ) daily at the same time in the morning  120 tablet  3  . carvedilol (COREG) 25 MG tablet 1 and 1/2 tablets (total 37.5mg ) two times a day  90 tablet   3  . Magnesium 200 MG TABS 1 tablet two times a day  60 each    . potassium chloride (K-DUR) 10 MEQ tablet 3 tablets (total 30 mEq) two times a day  180 tablet  3   No current facility-administered medications for this visit.    BP 157/68  Pulse 76  Ht 5' (1.524 m)  Wt 266 lb 12.8 oz (121.02 kg)  BMI 52.11 kg/m2  SpO2 92% General: NAD, obese Neck: Thick, JVP 8 cm, no thyromegaly or thyroid nodule.  Lungs: Clear to auscultation bilaterally with normal respiratory effort. CV: Nondisplaced PMI.  Heart regular S1/S2, no S3/S4, 2/6 SEM RUSB.  2+ chronic edema to knees bilaterally (but less than prior).  No carotid bruit.  Abdomen: Soft, nontender, no hepatosplenomegaly, no distention.  Skin: Venous stasis changes lower legs bilaterally.    Neurologic: Alert and oriented x 3.  Psych: Normal affect. Extremities: No clubbing or cyanosis.   Assessment/Plan: 1. Chronic diastolic CHF: Volume status better with 8 lb weight loss after switch to torsemide.  Still volume overloaded, however.  Venous insufficiency almost certainly contributes to her lower leg edema (also consider role for lymphedema).  She has a systolic murmur that corresponds to the LV mid-cavity gradient seen on prior echoes.  - Increase Coreg to 37.5 mg bid as there is still a mid-cavity gradient present on echo (mild compared to past).   - Continue current torsemide and increase KCl to 30 mEq bid.   - Add magnesium oxide 400 mg daily given cramping.   2. Pulmonary: I suspect that her primary problem is diastolic CHF along with obesity and deconditioning.  However, she had PFTs with a restrictive defect which could be due to obesity but also with  decreased DLCO since she last saw Dr. Vassie Loll.  I will have her return for pulmonary evaluation to make sure we are not missing anything.  3. Morbid obesity: Needs weight loss but very difficult given impaired mobility from arthritis.  4. Renal: History of AKI with aggressive diuresis.   BUN/creatinine is up some, will check BMET again in 10 days.  5. Hyperlipidemia: Will check lipids.    Marca Ancona 05/29/2012   Marca Ancona 05/29/2012 10:14 PM

## 2012-05-31 DIAGNOSIS — I5031 Acute diastolic (congestive) heart failure: Secondary | ICD-10-CM | POA: Diagnosis not present

## 2012-05-31 DIAGNOSIS — L97909 Non-pressure chronic ulcer of unspecified part of unspecified lower leg with unspecified severity: Secondary | ICD-10-CM

## 2012-05-31 DIAGNOSIS — I509 Heart failure, unspecified: Secondary | ICD-10-CM | POA: Diagnosis not present

## 2012-05-31 DIAGNOSIS — I872 Venous insufficiency (chronic) (peripheral): Secondary | ICD-10-CM | POA: Diagnosis not present

## 2012-06-01 DIAGNOSIS — I872 Venous insufficiency (chronic) (peripheral): Secondary | ICD-10-CM | POA: Diagnosis not present

## 2012-06-01 DIAGNOSIS — L97909 Non-pressure chronic ulcer of unspecified part of unspecified lower leg with unspecified severity: Secondary | ICD-10-CM | POA: Diagnosis not present

## 2012-06-01 DIAGNOSIS — E109 Type 1 diabetes mellitus without complications: Secondary | ICD-10-CM | POA: Diagnosis not present

## 2012-06-01 DIAGNOSIS — I5031 Acute diastolic (congestive) heart failure: Secondary | ICD-10-CM | POA: Diagnosis not present

## 2012-06-01 DIAGNOSIS — I509 Heart failure, unspecified: Secondary | ICD-10-CM | POA: Diagnosis not present

## 2012-06-01 DIAGNOSIS — I10 Essential (primary) hypertension: Secondary | ICD-10-CM | POA: Diagnosis not present

## 2012-06-04 ENCOUNTER — Encounter: Payer: Self-pay | Admitting: Internal Medicine

## 2012-06-04 ENCOUNTER — Ambulatory Visit (INDEPENDENT_AMBULATORY_CARE_PROVIDER_SITE_OTHER): Payer: Medicare Other | Admitting: Internal Medicine

## 2012-06-04 VITALS — BP 112/70 | HR 62 | Temp 98.0°F | Wt 271.0 lb

## 2012-06-04 DIAGNOSIS — M545 Low back pain, unspecified: Secondary | ICD-10-CM

## 2012-06-04 DIAGNOSIS — G2581 Restless legs syndrome: Secondary | ICD-10-CM | POA: Diagnosis not present

## 2012-06-04 DIAGNOSIS — E1065 Type 1 diabetes mellitus with hyperglycemia: Secondary | ICD-10-CM

## 2012-06-04 DIAGNOSIS — G609 Hereditary and idiopathic neuropathy, unspecified: Secondary | ICD-10-CM | POA: Diagnosis not present

## 2012-06-04 DIAGNOSIS — E1029 Type 1 diabetes mellitus with other diabetic kidney complication: Secondary | ICD-10-CM

## 2012-06-04 MED ORDER — PREGABALIN 50 MG PO CAPS: 50.0000 mg | ORAL_CAPSULE | Freq: Three times a day (TID) | ORAL | Status: DC

## 2012-06-04 MED ORDER — OXYCODONE-ACETAMINOPHEN 5-325 MG PO TABS: ORAL_TABLET | ORAL | Status: DC

## 2012-06-04 NOTE — Patient Instructions (Signed)
It was good to see you today. stop gabapentin Start Lyrica for pain Your prescription has been given to you to submit to your pharmacy. Please take as directed and contact our office if you believe you are having problem(s) with the medication(s). Other Medications reviewed, no changes at this time. Refill on Percocet Continue working with [facility] and wound care clinic -

## 2012-06-04 NOTE — Assessment & Plan Note (Signed)
Neuropathic leg pain - severe neuropathy per reports from [facility] - Change gabapentin 600 TID to lyrica 50 TID (not done 04/2012 as advised) Ok to continue oxy prn Advised continued work with [facility] and wound clinic for same

## 2012-06-04 NOTE — Assessment & Plan Note (Signed)
Changed endo from [facility] to [facility] for new opinion, previously with [facility] Now following at [facility] - on TID aprida and Lantus - next appt 06/2012  Lab Results  Component Value Date   HGBA1C 8.7* 10/05/2011

## 2012-06-04 NOTE — Progress Notes (Signed)
  Subjective:    Patient ID: Melissa Myers, female    DOB: [birth year], 67 y.o.   MRN: [id number]  HPI  Here for follow up - reviewed chronic medical issues and interval history  Past Medical History  Diagnosis Date  . Type II or unspecified type diabetes mellitus without mention of complication, not stated as uncontrolled     insulin dep  . Hyperlipidemia     hx rhabdo on statins  . Hypertension   . CHF (congestive heart failure)     diastolic  . OSA (obstructive sleep apnea)     insignificant apnea events 05/2009 sleep study  . Chronic diarrhea     a/w nausea - felt related to IBS  . RLS (restless legs syndrome)   . Osteoarthritis   . Stasis dermatitis      Review of Systems  Constitutional: Positive for fatigue. Negative for fever and unexpected weight change.  Respiratory: Positive for shortness of breath (chronic). Negative for cough, chest tightness and wheezing.   Cardiovascular: Negative for chest pain and palpitations. Leg swelling: chronic.  Musculoskeletal: Positive for back pain.  Skin: Positive for rash. Negative for wound.       Objective:   Physical Exam  BP 112/70  Pulse 62  Temp(Src) 98 F (36.7 C) (Oral)  Wt 271 lb (122.925 kg)  BMI 52.93 kg/m2  SpO2 94% Wt Readings from Last 3 Encounters:  06/04/12 271 lb (122.925 kg)  05/29/12 266 lb 12.8 oz (121.02 kg)  05/16/12 274 lb (124.286 kg)   Constitutional: She is overweight; appears well-developed and well-nourished. No distress. mom at side Neck: Thick; Normal range of motion. Neck supple. No JVD present. No thyromegaly present.  Cardiovascular: Normal rate, regular rhythm and normal heart sounds.  No murmur heard. chronic tight/woody changes with 1+ BLE edema Pulmonary/Chest: Effort normal and breath sounds diminished at bases bilaterally. No respiratory distress. She has no wheezes.  Skin: chronic thickening and mild erythema of distal BLE, stasis dermatitis without change - resolution of  pustules on anterior shin - RLE distal leg with single ulceration medially above malleolus, LLE distally with cracking but no ulceration. Psychiatric: She has a dysthymic mood and affect. Her behavior is normal. Judgment and thought content normal.       Lab Results  Component Value Date   WBC 8.8 12/15/2011   HGB 10.5* 12/15/2011   HCT 33.8* 12/15/2011   PLT 369 12/15/2011   CHOL 243* 02/28/2011   TRIG 281.0* 02/28/2011   HDL 53.10 02/28/2011   LDLDIRECT 120.9 02/28/2011   ALT 27 07/21/2010   AST 36 07/21/2010   NA 136 05/25/2012   K 3.7 05/25/2012   CL 96 05/25/2012   CREATININE 1.2 05/25/2012   BUN 43* 05/25/2012   CO2 30 05/25/2012   TSH 1.35 05/29/2009   INR 1.00 04/15/2011   HGBA1C 8.7* 10/05/2011    Assessment & Plan:  See problem list. Medications and labs reviewed today.

## 2012-06-04 NOTE — Assessment & Plan Note (Signed)
No neuro deficits on exam -  Chronic DDD 05/2010 and facet mediated pain -prior ortho eval for same continue tramadol, gabapentin and prn percocet (preferred to norco) -  avoiding NSAIDs due to lymphedema

## 2012-06-04 NOTE — Assessment & Plan Note (Signed)
On Requip for same for years -  follow up pulm as needed -  Sleep study 05/2009 reviewed - no significant apnea events

## 2012-06-08 ENCOUNTER — Other Ambulatory Visit (INDEPENDENT_AMBULATORY_CARE_PROVIDER_SITE_OTHER): Payer: Medicare Other

## 2012-06-08 DIAGNOSIS — I509 Heart failure, unspecified: Secondary | ICD-10-CM | POA: Diagnosis not present

## 2012-06-08 DIAGNOSIS — I10 Essential (primary) hypertension: Secondary | ICD-10-CM | POA: Diagnosis not present

## 2012-06-08 DIAGNOSIS — E109 Type 1 diabetes mellitus without complications: Secondary | ICD-10-CM | POA: Diagnosis not present

## 2012-06-08 DIAGNOSIS — I872 Venous insufficiency (chronic) (peripheral): Secondary | ICD-10-CM | POA: Diagnosis not present

## 2012-06-08 DIAGNOSIS — I25810 Atherosclerosis of coronary artery bypass graft(s) without angina pectoris: Secondary | ICD-10-CM

## 2012-06-08 DIAGNOSIS — I5022 Chronic systolic (congestive) heart failure: Secondary | ICD-10-CM | POA: Diagnosis not present

## 2012-06-08 DIAGNOSIS — I5031 Acute diastolic (congestive) heart failure: Secondary | ICD-10-CM | POA: Diagnosis not present

## 2012-06-08 DIAGNOSIS — L97909 Non-pressure chronic ulcer of unspecified part of unspecified lower leg with unspecified severity: Secondary | ICD-10-CM | POA: Diagnosis not present

## 2012-06-08 LAB — LIPID PANEL
HDL: 42.6 mg/dL (ref >39.00)
Total CHOL/HDL Ratio: 5
Triglycerides: 187 mg/dL — ABNORMAL HIGH (ref 0.0–149.0)

## 2012-06-08 LAB — MAGNESIUM: Magnesium: 1.8 mg/dL (ref 1.5–2.5)

## 2012-06-08 LAB — BASIC METABOLIC PANEL
CO2: 33 mEq/L — ABNORMAL HIGH (ref 19–32)
Calcium: 9.1 mg/dL (ref 8.4–10.5)
Chloride: 100 mEq/L (ref 96–112)
Creatinine, Ser: 1 mg/dL (ref 0.4–1.2)
Glucose, Bld: 164 mg/dL — ABNORMAL HIGH (ref 70–99)
Sodium: 141 mEq/L (ref 135–145)

## 2012-06-08 LAB — LDL CHOLESTEROL, DIRECT: Direct LDL: 126.9 mg/dL

## 2012-06-12 DIAGNOSIS — I5031 Acute diastolic (congestive) heart failure: Secondary | ICD-10-CM | POA: Diagnosis not present

## 2012-06-12 DIAGNOSIS — E109 Type 1 diabetes mellitus without complications: Secondary | ICD-10-CM | POA: Diagnosis not present

## 2012-06-12 DIAGNOSIS — L97909 Non-pressure chronic ulcer of unspecified part of unspecified lower leg with unspecified severity: Secondary | ICD-10-CM | POA: Diagnosis not present

## 2012-06-12 DIAGNOSIS — I509 Heart failure, unspecified: Secondary | ICD-10-CM | POA: Diagnosis not present

## 2012-06-12 DIAGNOSIS — I872 Venous insufficiency (chronic) (peripheral): Secondary | ICD-10-CM | POA: Diagnosis not present

## 2012-06-12 DIAGNOSIS — I10 Essential (primary) hypertension: Secondary | ICD-10-CM | POA: Diagnosis not present

## 2012-06-13 DIAGNOSIS — M5137 Other intervertebral disc degeneration, lumbosacral region: Secondary | ICD-10-CM | POA: Diagnosis not present

## 2012-06-15 ENCOUNTER — Telehealth: Payer: Self-pay | Admitting: Internal Medicine

## 2012-06-15 DIAGNOSIS — I872 Venous insufficiency (chronic) (peripheral): Secondary | ICD-10-CM | POA: Diagnosis not present

## 2012-06-15 DIAGNOSIS — I10 Essential (primary) hypertension: Secondary | ICD-10-CM | POA: Diagnosis not present

## 2012-06-15 DIAGNOSIS — I5031 Acute diastolic (congestive) heart failure: Secondary | ICD-10-CM | POA: Diagnosis not present

## 2012-06-15 DIAGNOSIS — I509 Heart failure, unspecified: Secondary | ICD-10-CM | POA: Diagnosis not present

## 2012-06-15 DIAGNOSIS — E109 Type 1 diabetes mellitus without complications: Secondary | ICD-10-CM | POA: Diagnosis not present

## 2012-06-15 DIAGNOSIS — L97909 Non-pressure chronic ulcer of unspecified part of unspecified lower leg with unspecified severity: Secondary | ICD-10-CM | POA: Diagnosis not present

## 2012-06-15 NOTE — Telephone Encounter (Signed)
Called pt back inform her md was out of office & no one had anything available this afternoon. Pt states since pain is still on (L) side of her eye she will go ahead and go to [facility] instead ED...Raechel Chute

## 2012-06-15 NOTE — Telephone Encounter (Signed)
Patient Information: ° Caller Name: Briya ° Phone: [phone] ° Patient: Melissa Myers, Melissa Myers ° Gender: Female ° DOB: 11/17/1945 ° Age: 67 Years ° PCP: Leschber, Valerie (Adults only) ° °Office Follow Up: ° Does the office need to follow up with this patient?: Yes ° Instructions For The Office: Patient triages to be seen today in ER or office. PLEASE ADVISE AND CALL PATIENT TO BE SEEN IN OFFICE OR ED. Home care instrutions provided. Understanding expressed.  °PLEASE CONTACT PATIENT. ° °RN Note: ° Patient triages to be seen today in ER or office. PLEASE ADVISE AND CALL PATIENT TO BE SEEN IN OFFICE OR ED. Home care instrutions provided. Understanding expressed.  °PLEASE CONTACT PATIENT. ° °Symptoms ° Reason For Call & Symptoms: Patient states at 04:45 this morning , she fell out of bed, stuck nightstand and wall. She landed striking above her left eye. +knot up at top of left  forehead blue in color and sore. (2) Left knee stuck the hardwood floor, slight more swelling, no bruising, She ambulates normally with walker.   She feels light headed more so than normal . No vomiting, no headache. Mothe ris with her ° Reviewed Health History In EMR: Yes ° Reviewed Medications In EMR: Yes ° Reviewed Allergies In EMR: Yes ° Reviewed Surgeries / Procedures: Yes ° Date of Onset of Symptoms: 06/15/2012 ° °Guideline(s) Used: ° Head Injury ° °Disposition Per Guideline:  ° Go to ED Now (or to Office with PCP Approval) ° °Reason For Disposition Reached:  ° Large swelling or bruise and size > palm of person's hand ° °Advice Given: ° Apply a Cold Pack: ° Apply a cold pack or an ice bag (wrapped in a moist towel) to the area for 20 minutes. Repeat in 1 hour, then every 4 hours while awake. ° Continue this for the first 48 hours after an injury. ° This will help decrease pain and swelling. ° Observation After Head Injury: ° Watch a person with a head injury very closely during the first 2 hours following the injury. ° Wake the  head-injured person every 4 hours for the first 24 hours. Check to see if he (or she) can walk and talk. ° Call Back If: ° Severe headache ° Extremity weakness or numbness occurs ° Slurred speech or blurred vision occurs ° Vomiting occurs ° You become worse. ° °RN Overrode Recommendation: ° Document Patient ° Patient triages to be seen today in ER or office. PLEASE ADVISE AND CALL PATIENT TO BE SEEN IN OFFICE OR ED. Home care instrutions provided. Understanding expressed.  °PLEASE CONTACT PATIENT. ° ° °

## 2012-06-15 NOTE — Telephone Encounter (Signed)
Patient Information:  Caller Name: Melissa Myers  Phone: [phone]  Patient: Melissa Myers, Melissa Myers  Gender: Female  DOB: [birth year]  Age: 67 Years  PCP: Rene Paci (Adults only)  Office Follow Up:  Does the office need to follow up with this patient?: Yes  Instructions For The Office: Patient triages to be seen today in ER or office. PLEASE ADVISE AND CALL PATIENT TO BE SEEN IN OFFICE OR ED. Home care instrutions provided. Understanding expressed.  PLEASE CONTACT PATIENT.  RN Note:  Patient triages to be seen today in ER or office. PLEASE ADVISE AND CALL PATIENT TO BE SEEN IN OFFICE OR ED. Home care instrutions provided. Understanding expressed.  PLEASE CONTACT PATIENT.  Symptoms  Reason For Call & Symptoms: Patient states at 04:45 this morning , she fell out of bed, stuck nightstand and wall. She landed striking above her left eye. +knot up at top of left  forehead blue in color and sore. (2) Left knee stuck the hardwood floor, slight more swelling, no bruising, She ambulates normally with walker.   She feels light headed more so than normal . No vomiting, no headache. Mothe ris with her  Reviewed Health History In EMR: Yes  Reviewed Medications In EMR: Yes  Reviewed Allergies In EMR: Yes  Reviewed Surgeries / Procedures: Yes  Date of Onset of Symptoms: 06/15/2012  Guideline(s) Used:  Head Injury  Disposition Per Guideline:   Go to ED Now (or to Office with PCP Approval)  Reason For Disposition Reached:   Large swelling or bruise and size > palm of person's hand  Advice Given:  Apply a Cold Pack:  Apply a cold pack or an ice bag (wrapped in a moist towel) to the area for 20 minutes. Repeat in 1 hour, then every 4 hours while awake.  Continue this for the first 48 hours after an injury.  This will help decrease pain and swelling.  Observation After Head Injury:  Watch a person with a head injury very closely during the first 2 hours following the injury.  Wake the  head-injured person every 4 hours for the first 24 hours. Check to see if he (or she) can walk and talk.  Call Back If:  Severe headache  Extremity weakness or numbness occurs  Slurred speech or blurred vision occurs  Vomiting occurs  You become worse.  RN Overrode Recommendation:  Document Patient  Patient triages to be seen today in ER or office. PLEASE ADVISE AND CALL PATIENT TO BE SEEN IN OFFICE OR ED. Home care instrutions provided. Understanding expressed.  PLEASE CONTACT PATIENT.

## 2012-06-19 DIAGNOSIS — E109 Type 1 diabetes mellitus without complications: Secondary | ICD-10-CM | POA: Diagnosis not present

## 2012-06-19 DIAGNOSIS — I10 Essential (primary) hypertension: Secondary | ICD-10-CM | POA: Diagnosis not present

## 2012-06-19 DIAGNOSIS — I509 Heart failure, unspecified: Secondary | ICD-10-CM | POA: Diagnosis not present

## 2012-06-19 DIAGNOSIS — L97909 Non-pressure chronic ulcer of unspecified part of unspecified lower leg with unspecified severity: Secondary | ICD-10-CM | POA: Diagnosis not present

## 2012-06-19 DIAGNOSIS — I5031 Acute diastolic (congestive) heart failure: Secondary | ICD-10-CM | POA: Diagnosis not present

## 2012-06-19 DIAGNOSIS — I872 Venous insufficiency (chronic) (peripheral): Secondary | ICD-10-CM | POA: Diagnosis not present

## 2012-06-21 ENCOUNTER — Encounter: Payer: Self-pay | Admitting: Internal Medicine

## 2012-06-22 DIAGNOSIS — I10 Essential (primary) hypertension: Secondary | ICD-10-CM | POA: Diagnosis not present

## 2012-06-22 DIAGNOSIS — L97909 Non-pressure chronic ulcer of unspecified part of unspecified lower leg with unspecified severity: Secondary | ICD-10-CM | POA: Diagnosis not present

## 2012-06-22 DIAGNOSIS — E109 Type 1 diabetes mellitus without complications: Secondary | ICD-10-CM | POA: Diagnosis not present

## 2012-06-22 DIAGNOSIS — I509 Heart failure, unspecified: Secondary | ICD-10-CM | POA: Diagnosis not present

## 2012-06-22 DIAGNOSIS — I5031 Acute diastolic (congestive) heart failure: Secondary | ICD-10-CM | POA: Diagnosis not present

## 2012-06-22 DIAGNOSIS — I872 Venous insufficiency (chronic) (peripheral): Secondary | ICD-10-CM | POA: Diagnosis not present

## 2012-06-26 ENCOUNTER — Telehealth: Payer: Self-pay

## 2012-06-26 DIAGNOSIS — I872 Venous insufficiency (chronic) (peripheral): Secondary | ICD-10-CM | POA: Diagnosis not present

## 2012-06-26 DIAGNOSIS — E109 Type 1 diabetes mellitus without complications: Secondary | ICD-10-CM | POA: Diagnosis not present

## 2012-06-26 DIAGNOSIS — I5031 Acute diastolic (congestive) heart failure: Secondary | ICD-10-CM | POA: Diagnosis not present

## 2012-06-26 DIAGNOSIS — I10 Essential (primary) hypertension: Secondary | ICD-10-CM | POA: Diagnosis not present

## 2012-06-26 DIAGNOSIS — I509 Heart failure, unspecified: Secondary | ICD-10-CM | POA: Diagnosis not present

## 2012-06-26 DIAGNOSIS — L97909 Non-pressure chronic ulcer of unspecified part of unspecified lower leg with unspecified severity: Secondary | ICD-10-CM | POA: Diagnosis not present

## 2012-06-26 MED ORDER — CEPHALEXIN 500 MG PO CAPS: 500.0000 mg | ORAL_CAPSULE | Freq: Four times a day (QID) | ORAL | Status: AC

## 2012-06-26 NOTE — Telephone Encounter (Signed)
HHRN called to inform MD that pt has some RLE swelling with redness and some open painful wounds. HHRN is requesting MD advise if Rx can be called in to treat or if pt requires an OV, please advise

## 2012-06-26 NOTE — Telephone Encounter (Signed)
Keflex qid x 10d - erx done

## 2012-06-26 NOTE — Telephone Encounter (Signed)
HHRN Tricia advised of Rx/pharmacy and will advised pt/family of same.

## 2012-06-27 ENCOUNTER — Other Ambulatory Visit: Payer: Self-pay | Admitting: *Deleted

## 2012-06-27 MED ORDER — ROPINIROLE HCL 2 MG PO TABS: 4.0000 mg | ORAL_TABLET | Freq: Every day | ORAL | Status: DC

## 2012-06-28 DIAGNOSIS — L97909 Non-pressure chronic ulcer of unspecified part of unspecified lower leg with unspecified severity: Secondary | ICD-10-CM | POA: Diagnosis not present

## 2012-06-28 DIAGNOSIS — I509 Heart failure, unspecified: Secondary | ICD-10-CM | POA: Diagnosis not present

## 2012-06-28 DIAGNOSIS — I10 Essential (primary) hypertension: Secondary | ICD-10-CM | POA: Diagnosis not present

## 2012-06-28 DIAGNOSIS — I5031 Acute diastolic (congestive) heart failure: Secondary | ICD-10-CM | POA: Diagnosis not present

## 2012-06-28 DIAGNOSIS — E109 Type 1 diabetes mellitus without complications: Secondary | ICD-10-CM | POA: Diagnosis not present

## 2012-06-28 DIAGNOSIS — I872 Venous insufficiency (chronic) (peripheral): Secondary | ICD-10-CM | POA: Diagnosis not present

## 2012-06-29 DIAGNOSIS — I5031 Acute diastolic (congestive) heart failure: Secondary | ICD-10-CM | POA: Diagnosis not present

## 2012-06-29 DIAGNOSIS — I509 Heart failure, unspecified: Secondary | ICD-10-CM | POA: Diagnosis not present

## 2012-06-29 DIAGNOSIS — I10 Essential (primary) hypertension: Secondary | ICD-10-CM | POA: Diagnosis not present

## 2012-06-29 DIAGNOSIS — I872 Venous insufficiency (chronic) (peripheral): Secondary | ICD-10-CM | POA: Diagnosis not present

## 2012-06-29 DIAGNOSIS — L97909 Non-pressure chronic ulcer of unspecified part of unspecified lower leg with unspecified severity: Secondary | ICD-10-CM | POA: Diagnosis not present

## 2012-06-29 DIAGNOSIS — E109 Type 1 diabetes mellitus without complications: Secondary | ICD-10-CM | POA: Diagnosis not present

## 2012-07-02 DIAGNOSIS — R7309 Other abnormal glucose: Secondary | ICD-10-CM | POA: Diagnosis not present

## 2012-07-02 DIAGNOSIS — E8881 Metabolic syndrome: Secondary | ICD-10-CM | POA: Diagnosis not present

## 2012-07-02 DIAGNOSIS — E119 Type 2 diabetes mellitus without complications: Secondary | ICD-10-CM | POA: Diagnosis not present

## 2012-07-06 DIAGNOSIS — L97909 Non-pressure chronic ulcer of unspecified part of unspecified lower leg with unspecified severity: Secondary | ICD-10-CM | POA: Diagnosis not present

## 2012-07-06 DIAGNOSIS — I10 Essential (primary) hypertension: Secondary | ICD-10-CM | POA: Diagnosis not present

## 2012-07-06 DIAGNOSIS — I872 Venous insufficiency (chronic) (peripheral): Secondary | ICD-10-CM | POA: Diagnosis not present

## 2012-07-06 DIAGNOSIS — I5031 Acute diastolic (congestive) heart failure: Secondary | ICD-10-CM | POA: Diagnosis not present

## 2012-07-06 DIAGNOSIS — E109 Type 1 diabetes mellitus without complications: Secondary | ICD-10-CM | POA: Diagnosis not present

## 2012-07-06 DIAGNOSIS — I509 Heart failure, unspecified: Secondary | ICD-10-CM | POA: Diagnosis not present

## 2012-07-08 DIAGNOSIS — L97909 Non-pressure chronic ulcer of unspecified part of unspecified lower leg with unspecified severity: Secondary | ICD-10-CM | POA: Diagnosis not present

## 2012-07-08 DIAGNOSIS — G2581 Restless legs syndrome: Secondary | ICD-10-CM | POA: Diagnosis not present

## 2012-07-08 DIAGNOSIS — I872 Venous insufficiency (chronic) (peripheral): Secondary | ICD-10-CM | POA: Diagnosis not present

## 2012-07-08 DIAGNOSIS — E109 Type 1 diabetes mellitus without complications: Secondary | ICD-10-CM | POA: Diagnosis not present

## 2012-07-08 DIAGNOSIS — I10 Essential (primary) hypertension: Secondary | ICD-10-CM | POA: Diagnosis not present

## 2012-07-08 DIAGNOSIS — G4733 Obstructive sleep apnea (adult) (pediatric): Secondary | ICD-10-CM | POA: Diagnosis not present

## 2012-07-08 DIAGNOSIS — I509 Heart failure, unspecified: Secondary | ICD-10-CM | POA: Diagnosis not present

## 2012-07-08 DIAGNOSIS — I5031 Acute diastolic (congestive) heart failure: Secondary | ICD-10-CM | POA: Diagnosis not present

## 2012-07-11 ENCOUNTER — Emergency Department (HOSPITAL_COMMUNITY): Payer: Medicare Other

## 2012-07-11 ENCOUNTER — Telehealth: Payer: Self-pay

## 2012-07-11 ENCOUNTER — Encounter (HOSPITAL_COMMUNITY): Payer: Self-pay | Admitting: Emergency Medicine

## 2012-07-11 ENCOUNTER — Inpatient Hospital Stay (HOSPITAL_COMMUNITY)
Admission: EM | Admit: 2012-07-11 | Discharge: 2012-07-16 | DRG: 291 | Disposition: A | Discharge status: Home / Self Care | Payer: Medicare Other | Attending: Internal Medicine | Admitting: Internal Medicine

## 2012-07-11 DIAGNOSIS — I5033 Acute on chronic diastolic (congestive) heart failure: Secondary | ICD-10-CM | POA: Diagnosis present

## 2012-07-11 DIAGNOSIS — I509 Heart failure, unspecified: Secondary | ICD-10-CM | POA: Diagnosis present

## 2012-07-11 DIAGNOSIS — I25810 Atherosclerosis of coronary artery bypass graft(s) without angina pectoris: Secondary | ICD-10-CM

## 2012-07-11 DIAGNOSIS — M1990 Unspecified osteoarthritis, unspecified site: Secondary | ICD-10-CM | POA: Diagnosis present

## 2012-07-11 DIAGNOSIS — E785 Hyperlipidemia, unspecified: Secondary | ICD-10-CM | POA: Diagnosis present

## 2012-07-11 DIAGNOSIS — L0390 Cellulitis, unspecified: Secondary | ICD-10-CM

## 2012-07-11 DIAGNOSIS — R609 Edema, unspecified: Secondary | ICD-10-CM | POA: Diagnosis not present

## 2012-07-11 DIAGNOSIS — J9600 Acute respiratory failure, unspecified whether with hypoxia or hypercapnia: Secondary | ICD-10-CM | POA: Diagnosis not present

## 2012-07-11 DIAGNOSIS — E1149 Type 2 diabetes mellitus with other diabetic neurological complication: Secondary | ICD-10-CM | POA: Diagnosis present

## 2012-07-11 DIAGNOSIS — E1129 Type 2 diabetes mellitus with other diabetic kidney complication: Secondary | ICD-10-CM | POA: Diagnosis not present

## 2012-07-11 DIAGNOSIS — I10 Essential (primary) hypertension: Secondary | ICD-10-CM | POA: Diagnosis present

## 2012-07-11 DIAGNOSIS — E1165 Type 2 diabetes mellitus with hyperglycemia: Secondary | ICD-10-CM | POA: Diagnosis present

## 2012-07-11 DIAGNOSIS — G2581 Restless legs syndrome: Secondary | ICD-10-CM | POA: Diagnosis present

## 2012-07-11 DIAGNOSIS — L03119 Cellulitis of unspecified part of limb: Secondary | ICD-10-CM | POA: Diagnosis present

## 2012-07-11 DIAGNOSIS — R0609 Other forms of dyspnea: Secondary | ICD-10-CM

## 2012-07-11 DIAGNOSIS — Z8249 Family history of ischemic heart disease and other diseases of the circulatory system: Secondary | ICD-10-CM | POA: Diagnosis not present

## 2012-07-11 DIAGNOSIS — Z6841 Body Mass Index (BMI) 40.0 and over, adult: Secondary | ICD-10-CM

## 2012-07-11 DIAGNOSIS — I872 Venous insufficiency (chronic) (peripheral): Secondary | ICD-10-CM | POA: Diagnosis present

## 2012-07-11 DIAGNOSIS — E1142 Type 2 diabetes mellitus with diabetic polyneuropathy: Secondary | ICD-10-CM | POA: Diagnosis present

## 2012-07-11 DIAGNOSIS — K921 Melena: Secondary | ICD-10-CM

## 2012-07-11 DIAGNOSIS — K649 Unspecified hemorrhoids: Secondary | ICD-10-CM | POA: Diagnosis present

## 2012-07-11 DIAGNOSIS — E876 Hypokalemia: Secondary | ICD-10-CM | POA: Diagnosis not present

## 2012-07-11 DIAGNOSIS — Z794 Long term (current) use of insulin: Secondary | ICD-10-CM | POA: Diagnosis not present

## 2012-07-11 DIAGNOSIS — L02419 Cutaneous abscess of limb, unspecified: Secondary | ICD-10-CM | POA: Diagnosis present

## 2012-07-11 DIAGNOSIS — G629 Polyneuropathy, unspecified: Secondary | ICD-10-CM | POA: Diagnosis present

## 2012-07-11 DIAGNOSIS — E8770 Fluid overload, unspecified: Secondary | ICD-10-CM

## 2012-07-11 DIAGNOSIS — I5022 Chronic systolic (congestive) heart failure: Secondary | ICD-10-CM

## 2012-07-11 DIAGNOSIS — R011 Cardiac murmur, unspecified: Secondary | ICD-10-CM

## 2012-07-11 DIAGNOSIS — N058 Unspecified nephritic syndrome with other morphologic changes: Secondary | ICD-10-CM | POA: Diagnosis present

## 2012-07-11 DIAGNOSIS — K7689 Other specified diseases of liver: Secondary | ICD-10-CM | POA: Diagnosis present

## 2012-07-11 DIAGNOSIS — I5032 Chronic diastolic (congestive) heart failure: Secondary | ICD-10-CM | POA: Diagnosis not present

## 2012-07-11 DIAGNOSIS — E873 Alkalosis: Secondary | ICD-10-CM | POA: Diagnosis not present

## 2012-07-11 DIAGNOSIS — G609 Hereditary and idiopathic neuropathy, unspecified: Secondary | ICD-10-CM

## 2012-07-11 DIAGNOSIS — M545 Low back pain, unspecified: Secondary | ICD-10-CM | POA: Diagnosis present

## 2012-07-11 DIAGNOSIS — E1065 Type 1 diabetes mellitus with hyperglycemia: Secondary | ICD-10-CM

## 2012-07-11 DIAGNOSIS — E8779 Other fluid overload: Secondary | ICD-10-CM | POA: Diagnosis not present

## 2012-07-11 DIAGNOSIS — I5031 Acute diastolic (congestive) heart failure: Secondary | ICD-10-CM | POA: Diagnosis not present

## 2012-07-11 DIAGNOSIS — I8310 Varicose veins of unspecified lower extremity with inflammation: Secondary | ICD-10-CM

## 2012-07-11 DIAGNOSIS — E669 Obesity, unspecified: Secondary | ICD-10-CM

## 2012-07-11 DIAGNOSIS — M79609 Pain in unspecified limb: Secondary | ICD-10-CM | POA: Diagnosis not present

## 2012-07-11 DIAGNOSIS — N259 Disorder resulting from impaired renal tubular function, unspecified: Secondary | ICD-10-CM

## 2012-07-11 DIAGNOSIS — M7989 Other specified soft tissue disorders: Secondary | ICD-10-CM | POA: Diagnosis not present

## 2012-07-11 DIAGNOSIS — E662 Morbid (severe) obesity with alveolar hypoventilation: Secondary | ICD-10-CM | POA: Diagnosis present

## 2012-07-11 DIAGNOSIS — R0600 Dyspnea, unspecified: Secondary | ICD-10-CM

## 2012-07-11 DIAGNOSIS — E109 Type 1 diabetes mellitus without complications: Secondary | ICD-10-CM | POA: Diagnosis not present

## 2012-07-11 DIAGNOSIS — E559 Vitamin D deficiency, unspecified: Secondary | ICD-10-CM | POA: Diagnosis present

## 2012-07-11 DIAGNOSIS — N289 Disorder of kidney and ureter, unspecified: Secondary | ICD-10-CM | POA: Diagnosis not present

## 2012-07-11 DIAGNOSIS — M129 Arthropathy, unspecified: Secondary | ICD-10-CM

## 2012-07-11 DIAGNOSIS — R0602 Shortness of breath: Secondary | ICD-10-CM | POA: Diagnosis not present

## 2012-07-11 DIAGNOSIS — L97909 Non-pressure chronic ulcer of unspecified part of unspecified lower leg with unspecified severity: Secondary | ICD-10-CM | POA: Diagnosis not present

## 2012-07-11 DIAGNOSIS — G4700 Insomnia, unspecified: Secondary | ICD-10-CM

## 2012-07-11 DIAGNOSIS — G4733 Obstructive sleep apnea (adult) (pediatric): Secondary | ICD-10-CM | POA: Diagnosis present

## 2012-07-11 DIAGNOSIS — M79604 Pain in right leg: Secondary | ICD-10-CM | POA: Diagnosis present

## 2012-07-11 DIAGNOSIS — I50812 Chronic right heart failure: Secondary | ICD-10-CM

## 2012-07-11 DIAGNOSIS — G4730 Sleep apnea, unspecified: Secondary | ICD-10-CM | POA: Diagnosis present

## 2012-07-11 DIAGNOSIS — R601 Generalized edema: Secondary | ICD-10-CM | POA: Diagnosis present

## 2012-07-11 HISTORY — DX: Shortness of breath: R06.02

## 2012-07-11 HISTORY — DX: Gastro-esophageal reflux disease without esophagitis: K21.9

## 2012-07-11 HISTORY — DX: Anemia, unspecified: D64.9

## 2012-07-11 LAB — BASIC METABOLIC PANEL
BUN: 24 mg/dL — ABNORMAL HIGH (ref 6–23)
Chloride: 99 mEq/L (ref 96–112)
Glucose, Bld: 187 mg/dL — ABNORMAL HIGH (ref 70–99)
Potassium: 3.9 mEq/L (ref 3.5–5.1)

## 2012-07-11 LAB — PROTIME-INR: INR: 1.1 (ref 0.00–1.49)

## 2012-07-11 LAB — POCT I-STAT TROPONIN I: Troponin i, poc: 0 ng/mL (ref 0.00–0.08)

## 2012-07-11 LAB — CBC
HCT: 34.4 % — ABNORMAL LOW (ref 36.0–46.0)
HCT: 32.9 % — ABNORMAL LOW (ref 36.0–46.0)
Hemoglobin: 10.7 g/dL — ABNORMAL LOW (ref 12.0–15.0)
Hemoglobin: 10.3 g/dL — ABNORMAL LOW (ref 12.0–15.0)
RBC: 4.28 MIL/uL (ref 3.87–5.11)
WBC: 8.8 10*3/uL (ref 4.0–10.5)
WBC: 8.3 10*3/uL (ref 4.0–10.5)

## 2012-07-11 LAB — D-DIMER, QUANTITATIVE: D-Dimer, Quant: 1.06 ug/mL-FEU — ABNORMAL HIGH (ref 0.00–0.48)

## 2012-07-11 LAB — URINALYSIS, ROUTINE W REFLEX MICROSCOPIC
Nitrite: NEGATIVE
Specific Gravity, Urine: 1.007 (ref 1.005–1.030)
Urobilinogen, UA: 0.2 mg/dL (ref 0.0–1.0)

## 2012-07-11 LAB — HEPATIC FUNCTION PANEL
ALT: 19 U/L (ref 0–35)
Alkaline Phosphatase: 70 U/L (ref 39–117)
Bilirubin, Direct: <0.1 mg/dL (ref 0.0–0.3)
Total Protein: 7.3 g/dL (ref 6.0–8.3)

## 2012-07-11 LAB — GLUCOSE, CAPILLARY

## 2012-07-11 LAB — PROTEIN / CREATININE RATIO, URINE

## 2012-07-11 MED ORDER — INSULIN ASPART 100 UNIT/ML ~~LOC~~ SOLN
0.0000 [IU] | Freq: Three times a day (TID) | SUBCUTANEOUS | Status: DC
  Administered 2012-07-12: 4 [IU] via SUBCUTANEOUS
  Administered 2012-07-12 (×2): 3 [IU] via SUBCUTANEOUS
  Administered 2012-07-13: 4 [IU] via SUBCUTANEOUS
  Administered 2012-07-13: 7 [IU] via SUBCUTANEOUS
  Administered 2012-07-14 (×2): 4 [IU] via SUBCUTANEOUS
  Administered 2012-07-14: 7 [IU] via SUBCUTANEOUS
  Administered 2012-07-15: 4 [IU] via SUBCUTANEOUS
  Administered 2012-07-15: 3 [IU] via SUBCUTANEOUS
  Administered 2012-07-15 – 2012-07-16 (×2): 4 [IU] via SUBCUTANEOUS
  Administered 2012-07-16: 3 [IU] via SUBCUTANEOUS
  Administered 2012-07-16: 4 [IU] via SUBCUTANEOUS

## 2012-07-11 MED ORDER — INSULIN ASPART 100 UNIT/ML ~~LOC~~ SOLN
6.0000 [IU] | Freq: Three times a day (TID) | SUBCUTANEOUS | Status: DC
  Administered 2012-07-12 – 2012-07-15 (×11): 6 [IU] via SUBCUTANEOUS
  Administered 2012-07-15: 1 [IU] via SUBCUTANEOUS
  Administered 2012-07-16 (×3): 6 [IU] via SUBCUTANEOUS

## 2012-07-11 MED ORDER — VANCOMYCIN HCL 10 G IV SOLR
2000.0000 mg | INTRAVENOUS | Status: AC
  Administered 2012-07-11: 2000 mg via INTRAVENOUS
  Filled 2012-07-11 (×2): qty 2000

## 2012-07-11 MED ORDER — HEPARIN SODIUM (PORCINE) 5000 UNIT/ML IJ SOLN
5000.0000 [IU] | Freq: Three times a day (TID) | INTRAMUSCULAR | Status: DC
  Administered 2012-07-11 – 2012-07-16 (×14): 5000 [IU] via SUBCUTANEOUS
  Filled 2012-07-11 (×18): qty 1

## 2012-07-11 MED ORDER — INSULIN GLARGINE 100 UNIT/ML ~~LOC~~ SOLN
35.0000 [IU] | Freq: Every day | SUBCUTANEOUS | Status: DC
  Administered 2012-07-11 – 2012-07-15 (×5): 35 [IU] via SUBCUTANEOUS
  Filled 2012-07-11 (×6): qty 0.35

## 2012-07-11 MED ORDER — GABAPENTIN 400 MG PO CAPS
400.0000 mg | ORAL_CAPSULE | Freq: Once | ORAL | Status: AC
  Administered 2012-07-11: 400 mg via ORAL
  Filled 2012-07-11: qty 1

## 2012-07-11 MED ORDER — ASPIRIN 81 MG PO CHEW
81.0000 mg | CHEWABLE_TABLET | Freq: Every day | ORAL | Status: DC
  Administered 2012-07-12 – 2012-07-16 (×5): 81 mg via ORAL
  Filled 2012-07-11 (×4): qty 1

## 2012-07-11 MED ORDER — NYSTATIN 100000 UNIT/ML MT SUSP
5.0000 mL | Freq: Four times a day (QID) | OROMUCOSAL | Status: DC
  Administered 2012-07-11 – 2012-07-16 (×19): 500000 [IU] via ORAL
  Filled 2012-07-11 (×21): qty 5

## 2012-07-11 MED ORDER — ONDANSETRON HCL 4 MG/2ML IJ SOLN: 4.0000 mg | Freq: Three times a day (TID) | INTRAMUSCULAR | Status: AC | PRN

## 2012-07-11 MED ORDER — ROPINIROLE HCL 1 MG PO TABS
2.0000 mg | ORAL_TABLET | Freq: Two times a day (BID) | ORAL | Status: DC
  Administered 2012-07-12 – 2012-07-16 (×10): 2 mg via ORAL
  Filled 2012-07-11 (×10): qty 2

## 2012-07-11 MED ORDER — ACETAMINOPHEN 325 MG PO TABS: 650.0000 mg | ORAL_TABLET | Freq: Four times a day (QID) | ORAL | Status: DC | PRN

## 2012-07-11 MED ORDER — ASPIRIN 81 MG PO TABS: 81.0000 mg | ORAL_TABLET | Freq: Every day | ORAL | Status: DC

## 2012-07-11 MED ORDER — SODIUM CHLORIDE 0.9 % IJ SOLN
3.0000 mL | Freq: Two times a day (BID) | INTRAMUSCULAR | Status: DC
  Administered 2012-07-11 – 2012-07-16 (×9): 3 mL via INTRAVENOUS

## 2012-07-11 MED ORDER — ACETAMINOPHEN 650 MG RE SUPP: 650.0000 mg | Freq: Four times a day (QID) | RECTAL | Status: DC | PRN

## 2012-07-11 MED ORDER — CARVEDILOL 25 MG PO TABS
25.0000 mg | ORAL_TABLET | Freq: Two times a day (BID) | ORAL | Status: DC
  Administered 2012-07-12 – 2012-07-16 (×10): 25 mg via ORAL
  Filled 2012-07-11 (×12): qty 1

## 2012-07-11 MED ORDER — FUROSEMIDE 10 MG/ML IJ SOLN
8.0000 mg/h | INTRAVENOUS | Status: DC
  Administered 2012-07-11: 4 mg/h via INTRAVENOUS
  Administered 2012-07-12 – 2012-07-13 (×2): 8 mg/h via INTRAVENOUS
  Filled 2012-07-11 (×4): qty 25

## 2012-07-11 MED ORDER — ROPINIROLE HCL 1 MG PO TABS
4.0000 mg | ORAL_TABLET | Freq: Every day | ORAL | Status: DC
  Administered 2012-07-11 – 2012-07-15 (×5): 4 mg via ORAL
  Filled 2012-07-11 (×6): qty 4

## 2012-07-11 MED ORDER — DIPHENHYDRAMINE HCL 25 MG PO CAPS
25.0000 mg | ORAL_CAPSULE | Freq: Four times a day (QID) | ORAL | Status: DC | PRN
  Administered 2012-07-11: 25 mg via ORAL
  Administered 2012-07-12: 50 mg via ORAL
  Administered 2012-07-13 (×2): 25 mg via ORAL
  Administered 2012-07-14 (×2): 50 mg via ORAL
  Administered 2012-07-15: 25 mg via ORAL
  Filled 2012-07-11: qty 1
  Filled 2012-07-11: qty 2
  Filled 2012-07-11 (×2): qty 1
  Filled 2012-07-11 (×2): qty 2
  Filled 2012-07-11: qty 1

## 2012-07-11 MED ORDER — INSULIN ASPART 100 UNIT/ML ~~LOC~~ SOLN
0.0000 [IU] | Freq: Every day | SUBCUTANEOUS | Status: DC
  Administered 2012-07-13: 2 [IU] via SUBCUTANEOUS

## 2012-07-11 MED ORDER — SODIUM CHLORIDE 0.9 % IV SOLN
INTRAVENOUS | Status: AC
  Administered 2012-07-11: 21:00:00 via INTRAVENOUS

## 2012-07-11 MED ORDER — VANCOMYCIN HCL 10 G IV SOLR
1250.0000 mg | Freq: Two times a day (BID) | INTRAVENOUS | Status: DC
  Administered 2012-07-12 – 2012-07-13 (×3): 1250 mg via INTRAVENOUS
  Filled 2012-07-11 (×4): qty 1250

## 2012-07-11 NOTE — Telephone Encounter (Signed)
Kim with [facility] calls [phone] states patient has increased swelling in lower abdomen and sob. Please advise.

## 2012-07-11 NOTE — Progress Notes (Signed)
Pt HR down to 30s-40s, pt back up to 50s sustaining. Pt asymptomatic. MD notified and no new orders given. Baron Hamper, RN 07/11/2012

## 2012-07-11 NOTE — ED Notes (Signed)
MD at bedside. 

## 2012-07-11 NOTE — ED Provider Notes (Signed)
History     CSN: [id number]  Arrival date & time 07/11/12  1357   First MD Initiated Contact with Patient 07/11/12 1536      Chief Complaint  Patient presents with  . Shortness of Breath  . Weight Gain    (Consider location/radiation/quality/duration/timing/severity/associated sxs/prior treatment) HPI Comments: 67 year old female with a history of diabetes, hypertension, congestive heart failure, sleep apnea as well as lymphedema of her lower extremities presents with a complaint of shortness of breath. The shortness of breath is gradual in onset, gradually getting worse and is now severe requiring her to sleep in a recliner. She no severe swelling of her lower extremities as well as her abdomen, no cough, no fever, no pain in her chest or abdomen and no headache. Nothing seems this better or worse, she does take torsemide but has had decreased urinary frequency.  Patient is a 67 y.o. female presenting with shortness of breath. The history is provided by the patient.  Shortness of Breath   Past Medical History  Diagnosis Date  . Type II or unspecified type diabetes mellitus without mention of complication, not stated as uncontrolled     insulin dep  . Hyperlipidemia     hx rhabdo on statins  . Hypertension   . CHF (congestive heart failure)     diastolic  . OSA (obstructive sleep apnea)     insignificant apnea events 05/2009 sleep study  . Chronic diarrhea     a/w nausea - felt related to IBS  . RLS (restless legs syndrome)   . Osteoarthritis   . Stasis dermatitis     Past Surgical History  Procedure Laterality Date  . Tubal ligation  1980  . Cholecystectomy  1997  . Tonsillectomy  1970  . Uterine polyp removal  2008  . Umbilical hernia repair  1995    Family History  Problem Relation Age of Onset  . Heart disease Mother 54    MI  . Heart disease Father 6    MI  . Heart disease Other     MI- uncle and multiple aunts (aunts with CVAs)  . Cancer Other    Stomach cancer grandmother    History  Substance Use Topics  . Smoking status: Never Smoker   . Smokeless tobacco: Never Used  . Alcohol Use: No    OB History   Grav Para Term Preterm Abortions TAB SAB Ect Mult Living                  Review of Systems  Respiratory: Positive for shortness of breath.   All other systems reviewed and are negative.    Allergies  Morphine; Sulfa antibiotics; Sulfonamide derivatives; and Zinc  Home Medications   Current Outpatient Rx  Name  Route  Sig  Dispense  Refill  . aspirin 81 MG tablet   Oral   Take 81 mg by mouth daily.          . carvedilol (COREG) 25 MG tablet      1 and 1/2 tablets (total 37.5mg ) two times a day   90 tablet   3   . Coenzyme Q10 (COQ10) 30 MG CAPS   Oral   Take 1 capsule by mouth daily.          . fenofibrate 160 MG tablet   Oral   Take 1 tablet (160 mg total) by mouth daily.   30 tablet   5   . flyticasone (CUTIVATE) 0.005 % ointment  Topical   Apply topically 3 (three) times daily as needed. For itching   60 g   2   . gabapentin (NEURONTIN) 400 MG capsule   Oral   Take 400 mg by mouth 2 (two) times daily.         . hydrALAZINE (APRESOLINE) 50 MG tablet   Oral   Take 1 tablet (50 mg total) by mouth 3 (three) times daily.   90 tablet   6   . insulin glargine (LANTUS) 100 UNIT/ML injection   Subcutaneous   Inject 60 Units into the skin at bedtime.   10 mL   12   . insulin glulisine (APIDRA) 100 UNIT/ML injection   Subcutaneous   Inject 27-29 Units into the skin 3 (three) times daily before meals. Takes 29 units in the am, 28 units midday & 27 units at night         . Magnesium 200 MG TABS   Oral   Take 1 tablet by mouth 2 (two) times daily. 1 tablet two times a day   60 each      . metFORMIN (GLUCOPHAGE) 500 MG tablet   Oral   Take 500-1,000 mg by mouth 3 (three) times daily between meals as needed. For elevated blood sugar         . nystatin (MYCOSTATIN) powder    Topical   Apply topically 4 (four) times daily as needed. For topical infections         . omega-3 acid ethyl esters (LOVAZA) 1 G capsule   Oral   Take 2 g by mouth 2 (two) times daily.         [name] oxyCODONE-acetaminophen (PERCOCET/ROXICET) 5-325 MG per tablet   Oral   Take 1 tablet by mouth every 6 (six) hours as needed. for pain         . potassium chloride (K-DUR) 10 MEQ tablet   Oral   Take 30 mEq by mouth 2 (two) times daily. 3 tablets (total 30 mEq) two times a day         . rOPINIRole (REQUIP) 2 MG tablet   Oral   Take 2 mg by mouth 2 (two) times daily.         [name] torsemide (DEMADEX) 20 MG tablet      Take 4 tablets (total 80mg ) daily at the same time in the morning   120 tablet   3   . ONE TOUCH ULTRA TEST test strip      USE TO CHECK BLOOD GLUCOSE TWICE DAILY   100 each   0     BP 123/56  Pulse 46  Temp(Src) 98.2 F (36.8 C) (Oral)  Resp 17  Wt 283 lb 3.2 oz (128.459 kg)  BMI 55.31 kg/m2  SpO2 95%  Physical Exam  Nursing note and vitals reviewed. Constitutional: She appears well-developed and well-nourished. She appears distressed.  Increased work of breathing, shallow breathing  HENT:  Head: Normocephalic and atraumatic.  Mouth/Throat: No oropharyngeal exudate.  Mucous membranes are dry  Eyes: Conjunctivae and EOM are normal. Pupils are equal, round, and reactive to light. Right eye exhibits no discharge. Left eye exhibits no discharge. No scleral icterus.  Neck: Normal range of motion. Neck supple. No JVD present. No thyromegaly present.  Cardiovascular: Regular rhythm, normal heart sounds and intact distal pulses.  Exam reveals no gallop and no friction rub.   No murmur heard. Bradycardic at 55, no murmurs  Pulmonary/Chest: Effort normal and breath sounds  normal. No respiratory distress. She has no wheezes. She has no rales.  Abdominal: Soft. Bowel sounds are normal. She exhibits no distension and no mass. There is no tenderness.  Anasarca  to the umbilicus  Musculoskeletal: Normal range of motion. She exhibits edema and tenderness.  Lymphadenopathy:    She has no cervical adenopathy.  Neurological: She is alert. Coordination normal.  Skin: Skin is warm and dry. No rash noted. No erythema.  Psychiatric: She has a normal mood and affect. Her behavior is normal.    ED Course  Procedures (including critical care time)  Labs Reviewed  CBC - Abnormal; Notable for the following:    Hemoglobin 10.7 (*)    HCT 34.4 (*)    MCV 76.8 (*)    MCH 23.9 (*)    RDW 17.2 (*)    All other components within normal limits  BASIC METABOLIC PANEL - Abnormal; Notable for the following:    Glucose, Bld 187 (*)    BUN 24 (*)    GFR calc non Af Amer 88 (*)    All other components within normal limits  PRO B NATRIURETIC PEPTIDE - Abnormal; Notable for the following:    Pro B Natriuretic peptide (BNP) 127.9 (*)    All other components within normal limits  D-DIMER, QUANTITATIVE - Abnormal; Notable for the following:    D-Dimer, Quant 1.06 (*)    All other components within normal limits  URINALYSIS, ROUTINE W REFLEX MICROSCOPIC - Abnormal; Notable for the following:    Leukocytes, UA SMALL (*)    All other components within normal limits  PROTIME-INR  URINE MICROSCOPIC-ADD ON  HEPATIC FUNCTION PANEL  POCT I-STAT TROPONIN I   Dg Chest 2 View  07/11/2012   *RADIOLOGY REPORT*  Clinical Data: Short of breath.  Fluid retention.  History of diastolic heart failure.  CHEST - 2 VIEW  Comparison: 12/31/2010  Findings: Mild cardiomegaly persists.  There is pulmonary venous hypertension without interstitial or alveolar edema.  No pleural fluid.  No focal parenchymal finding.  Ordinary degenerative changes effect the spine.  IMPRESSION: Chronic mild cardiomegaly.  Probable pulmonary venous hypertension. No frank edema.   Original Report Authenticated By: Paulina Fusi, M.D.     1. Dyspnea   2. Fluid overload   3. Anasarca       MDM  Patient  has some erythema of  edematous areas of the lower extremities but states this is normal, increased work of breathing in clear respiratory distress, no obvious source of the patient's symptoms based on labs, fluid overload obvious, needs admission and diuresis.    Show no leukocytosis, slight anemia which is stable over time, no renal failure, hyperglycemia and normal coagulation panel. The patient does have anasarca and significant fluid overload likely does not have any signs of pulmonary edema. At this time the patient will need admission to the hospital for ongoing diuresis areas I have discussed her care with the hospitalist who agrees and will admit.  Results for orders placed during the hospital encounter of 07/11/12  CBC      Result Value Range   WBC 8.8  4.0 - 10.5 K/uL   RBC 4.48  3.87 - 5.11 MIL/uL   Hemoglobin 10.7 (*) 12.0 - 15.0 g/dL   HCT 95.6 (*) 21.3 - 08.6 %   MCV 76.8 (*) 78.0 - 100.0 fL   MCH 23.9 (*) 26.0 - 34.0 pg   MCHC 31.1  30.0 - 36.0 g/dL   RDW 57.8 (*)  11.5 - 15.5 %   Platelets 279  150 - 400 K/uL  BASIC METABOLIC PANEL      Result Value Range   Sodium 139  135 - 145 mEq/L   Potassium 3.9  3.5 - 5.1 mEq/L   Chloride 99  96 - 112 mEq/L   CO2 24  19 - 32 mEq/L   Glucose, Bld 187 (*) 70 - 99 mg/dL   BUN 24 (*) 6 - 23 mg/dL   Creatinine, Ser 8.46  0.50 - 1.10 mg/dL   Calcium 9.6  8.4 - 96.2 mg/dL   GFR calc non Af Amer 88 (*) >90 mL/min   GFR calc Af Amer >90  >90 mL/min  PRO B NATRIURETIC PEPTIDE      Result Value Range   Pro B Natriuretic peptide (BNP) 127.9 (*) 0 - 125 pg/mL  PROTIME-INR      Result Value Range   Prothrombin Time 14.1  11.6 - 15.2 seconds   INR 1.10  0.00 - 1.49  D-DIMER, QUANTITATIVE      Result Value Range   D-Dimer, Quant 1.06 (*) 0.00 - 0.48 ug/mL-FEU  URINALYSIS, ROUTINE W REFLEX MICROSCOPIC      Result Value Range   Color, Urine YELLOW  YELLOW   APPearance CLEAR  CLEAR   Specific Gravity, Urine 1.007  1.005 - 1.030   pH  6.0  5.0 - 8.0   Glucose, UA NEGATIVE  NEGATIVE mg/dL   Hgb urine dipstick NEGATIVE  NEGATIVE   Bilirubin Urine NEGATIVE  NEGATIVE   Ketones, ur NEGATIVE  NEGATIVE mg/dL   Protein, ur NEGATIVE  NEGATIVE mg/dL   Urobilinogen, UA 0.2  0.0 - 1.0 mg/dL   Nitrite NEGATIVE  NEGATIVE   Leukocytes, UA SMALL (*) NEGATIVE  URINE MICROSCOPIC-ADD ON      Result Value Range   Squamous Epithelial / LPF RARE  RARE   WBC, UA 0-2  <3 WBC/hpf   RBC / HPF 0-2  <3 RBC/hpf  POCT I-STAT TROPONIN I      Result Value Range   Troponin i, poc 0.00  0.00 - 0.08 ng/mL   Comment 3            Dg Chest 2 View  07/11/2012   *RADIOLOGY REPORT*  Clinical Data: Short of breath.  Fluid retention.  History of diastolic heart failure.  CHEST - 2 VIEW  Comparison: 12/31/2010  Findings: Mild cardiomegaly persists.  There is pulmonary venous hypertension without interstitial or alveolar edema.  No pleural fluid.  No focal parenchymal finding.  Ordinary degenerative changes effect the spine.  IMPRESSION: Chronic mild cardiomegaly.  Probable pulmonary venous hypertension. No frank edema.   Original Report Authenticated By: Paulina Fusi, M.D.        Vida Roller, MD 07/11/12 [id number]

## 2012-07-11 NOTE — ED Notes (Signed)
Pt passed bright red watery stool with single large clot. Pt denies pain with passing of stool. Sample from stool shown to doctor Hyacinth Meeker. Dr. Hyacinth Meeker acknowledges that pt passed bloody stool. Pt cleaned and assisted back in bed with daughter at the bedside. Pt states "I am just so tired and I don't know why."

## 2012-07-11 NOTE — Telephone Encounter (Signed)
Take 2 extra Demadex in afternoon each day x 5 days, then resume usual dose - OV if worse of unimproved - here or with cardiology

## 2012-07-11 NOTE — Progress Notes (Signed)
ANTIBIOTIC CONSULT NOTE - INITIAL  Pharmacy Consult for Vancomycin Indication: Bilateral LE cellulitis  Allergies  Allergen Reactions  . Morphine     REACTION: GI upset and headaches  . Sulfa Antibiotics Diarrhea  . Sulfonamide Derivatives   . Zinc Swelling and Rash    Patient Measurements: Height: 5\' 2"  (157.5 cm) Weight: 280 lb 6.8 oz (127.2 kg) IBW/kg (Calculated) : 50.1 Adjusted Body Weight: 82 kg   Vital Signs: Temp: 98.1 F (36.7 C) (05/21 1954) Temp src: Oral (05/21 1954) BP: 141/61 mmHg (05/21 1954) Pulse Rate: 61 (05/21 1954) Intake/Output from previous day:   Intake/Output from this shift:    Labs:  Recent Labs  07/11/12 1443 07/11/12 2015  WBC 8.8 8.3  HGB 10.7* 10.3*  PLT 279 271  CREATININE 0.70  --    Estimated Creatinine Clearance: 87.2 ml/min (by C-G formula based on Cr of 0.7). No results found for this basename: VANCOTROUGH, VANCOPEAK, VANCORANDOM, GENTTROUGH, GENTPEAK, GENTRANDOM, TOBRATROUGH, TOBRAPEAK, TOBRARND, AMIKACINPEAK, AMIKACINTROU, AMIKACIN,  in the last 72 hours   Microbiology: No results found for this or any previous visit (from the past 720 hour(s)).  Medical History: Past Medical History  Diagnosis Date  . Type II or unspecified type diabetes mellitus without mention of complication, not stated as uncontrolled     insulin dep  . Hyperlipidemia     hx rhabdo on statins  . Hypertension   . CHF (congestive heart failure)     diastolic  . OSA (obstructive sleep apnea)     insignificant apnea events 05/2009 sleep study  . Chronic diarrhea     a/w nausea - felt related to IBS  . RLS (restless legs syndrome)   . Osteoarthritis   . Stasis dermatitis   . Shortness of breath   . GERD (gastroesophageal reflux disease)   . Anemia     Medications:  Prescriptions prior to admission  Medication Sig Dispense Refill  . aspirin 81 MG tablet Take 81 mg by mouth daily.       . carvedilol (COREG) 25 MG tablet 1 and 1/2 tablets  (total 37.5mg ) two times a day  90 tablet  3  . cephALEXin (KEFLEX) 500 MG capsule Take 500 mg by mouth 4 (four) times daily.      . Coenzyme Q10 (COQ10) 30 MG CAPS Take 1 capsule by mouth daily.       . fenofibrate 160 MG tablet Take 1 tablet (160 mg total) by mouth daily.  30 tablet  5  . flyticasone (CUTIVATE) 0.005 % ointment Apply topically 3 (three) times daily as needed. For itching  60 g  2  . gabapentin (NEURONTIN) 400 MG capsule Take 400 mg by mouth 2 (two) times daily.      . hydrALAZINE (APRESOLINE) 50 MG tablet Take 1 tablet (50 mg total) by mouth 3 (three) times daily.  90 tablet  6  . insulin glargine (LANTUS) 100 UNIT/ML injection Inject 60 Units into the skin at bedtime.  10 mL  12  . insulin glulisine (APIDRA) 100 UNIT/ML injection Inject 27-29 Units into the skin 3 (three) times daily before meals. Takes 29 units in the am, 28 units midday & 27 units at night      . Magnesium 200 MG TABS Take 1 tablet by mouth 2 (two) times daily. 1 tablet two times a day  60 each    . metFORMIN (GLUCOPHAGE) 500 MG tablet Take 500-1,000 mg by mouth 3 (three) times daily between meals as  needed. For elevated blood sugar      . nystatin (MYCOSTATIN) powder Apply topically 4 (four) times daily as needed. For topical infections      . omega-3 acid ethyl esters (LOVAZA) 1 G capsule Take 2 g by mouth 2 (two) times daily.      [name] oxyCODONE-acetaminophen (PERCOCET/ROXICET) 5-325 MG per tablet Take 1 tablet by mouth every 6 (six) hours as needed. for pain      . potassium chloride (K-DUR) 10 MEQ tablet Take 30 mEq by mouth 2 (two) times daily. 3 tablets (total 30 mEq) two times a day      . rOPINIRole (REQUIP) 2 MG tablet Take 2 mg by mouth 2 (two) times daily.      [name] torsemide (DEMADEX) 20 MG tablet Take 4 tablets (total 80mg ) daily at the same time in the morning  120 tablet  3  . ONE TOUCH ULTRA TEST test strip USE TO CHECK BLOOD GLUCOSE TWICE DAILY  100 each  0   Scheduled:  . sodium chloride    Intravenous STAT  . [START ON 07/12/2012] aspirin  81 mg Oral Daily  . [START ON 07/12/2012] carvedilol  25 mg Oral BID WC  . gabapentin  400 mg Oral Once  . heparin  5,000 Units Subcutaneous Q8H  . [START ON 07/12/2012] insulin aspart  0-20 Units Subcutaneous TID WC  . insulin aspart  0-5 Units Subcutaneous QHS  . [START ON 07/12/2012] insulin aspart  6 Units Subcutaneous TID WC  . insulin glargine  35 Units Subcutaneous QHS  . nystatin  5 mL Oral QID  . [START ON 07/12/2012] rOPINIRole  2 mg Oral BID  . rOPINIRole  4 mg Oral QHS  . sodium chloride  3 mL Intravenous Q12H  . [START ON 07/12/2012] vancomycin  1,250 mg Intravenous Q12H  . vancomycin  2,000 mg Intravenous NOW   Infusions:  . furosemide (LASIX) infusion 4 mg/hr (07/11/12 2041)   Assessment: 67 y.o female with PMH of chronic diastolic CHF, morbid obesity, diabetes admitted with shortness of breath, weight gain, new onset hematochezia,  worsening pain in her legs and new wounds on both lower extremities. Starting IV vancomycin for bilateral LE cellulitis.  SCr = 0.7,  CrCL ~ 87 ml/min.  TBW 127kg.   Goal of Therapy:  Vancomycin trough level 10-15 mcg/ml  Plan:  Vancomycin 2000 mg IV x1 loading dose then 1250 mg IV q12h.  Follow up clinical status, culture results and vancomycin steady state trough when appropriate.   Noah Delaine, RPh Clinical Pharmacist Pager: [phone]  07/11/2012,8:41 PM

## 2012-07-11 NOTE — ED Notes (Signed)
Dr Miller at bedside. 

## 2012-07-11 NOTE — ED Notes (Signed)
Pt sts increased SOB and weight gain over last several weeks; pt sts some rectal bleeding unsure if could be from hemorrhoids and c/o ulcerations to legs; pt sts having to sleep upright due to SOB

## 2012-07-11 NOTE — ED Notes (Signed)
Pts daughter states that pt has had increased work of breathing and states she has to sit up in recliner to sleep at night. Pts daughter states she is worried about pts legs have increased in swelling with areas that are breaking open on skin with yellow d/c and pus. Pts daughter states the nursing care that comes to see pt thinks that pt is holding on to fluids. Pt alert and mentating appropriately pt taking shallow breaths and noted to have dyspnea on rest and with exertion. Pt denies chest pain.

## 2012-07-11 NOTE — H&P (Signed)
[facility] History and Physical  Melissa Myers ZOX:[id number] DOB: 02-06-1946 DOA: 07/11/2012  Referring physician: ED PCP: Rene Paci, MD    Chief Complaint:  Chief Complaint  Patient presents with  . Shortness of Breath  . Weight Gain     HPI: Melissa Myers is a 67 y.o. female with past medical history of chronic diastolic congestive heart failure, morbid obesity, diabetes, who was referred to the emergency room today by her home health care nurse to 2 worsening dyspnea, hypoxia and significant weight gain for the past month. According to the daughter the patient may have gained about 20 pounds in the last month. She is already on very high doses of torsemide the patient reports that her urinary output seems to be decreasing. Patient denies any chest pain she denies any fevers or chills. She also reports new onset hematochezia today and worsening pain in her legs and new wounds on both lower extremities.   Review of Systems: The patient reports anorexia, daughter endorses altered mental status, patient with significant dyspnea on exertion Denies  fever,  vision loss, decreased hearing, hoarseness, chest pain, syncope,  balance deficits, hemoptysis, abdominal pain, melena, severe indigestion/heartburn, hematuria, incontinence, genital sores, muscle weakness, , transient blindness, depression,   Past Medical History  Diagnosis Date  . Type II or unspecified type diabetes mellitus without mention of complication, not stated as uncontrolled     insulin dep  . Hyperlipidemia     hx rhabdo on statins  . Hypertension   . CHF (congestive heart failure)     diastolic  . OSA (obstructive sleep apnea)     insignificant apnea events 05/2009 sleep study  . Chronic diarrhea     a/w nausea - felt related to IBS  . RLS (restless legs syndrome)   . Osteoarthritis   . Stasis dermatitis    Past Surgical History  Procedure Laterality Date  . Tubal ligation  1980  .  Cholecystectomy  1997  . Tonsillectomy  1970  . Uterine polyp removal  2008  . Umbilical hernia repair  1995   Social History:  reports that she has never smoked. She has never used smokeless tobacco. She reports that she does not drink alcohol or use illicit drugs. Patient is at home Allergies  Allergen Reactions  . Morphine     REACTION: GI upset and headaches  . Sulfa Antibiotics Diarrhea  . Sulfonamide Derivatives   . Zinc Swelling and Rash    Family History  Problem Relation Age of Onset  . Heart disease Mother 20    MI  . Heart disease Father 47    MI  . Heart disease Other     MI- uncle and multiple aunts (aunts with CVAs)  . Cancer Other     Stomach cancer grandmother     Prior to Admission medications   Medication Sig Start Date End Date Taking? Authorizing Provider  aspirin 81 MG tablet Take 81 mg by mouth daily.    Yes Historical Provider, MD  carvedilol (COREG) 25 MG tablet 1 and 1/2 tablets (total 37.5mg ) two times a day 05/29/12  Yes Laurey Morale, MD  Coenzyme Q10 (COQ10) 30 MG CAPS Take 1 capsule by mouth daily.    Yes Historical Provider, MD  fenofibrate 160 MG tablet Take 1 tablet (160 mg total) by mouth daily. 01/18/12  Yes Newt Lukes, MD  flyticasone (CUTIVATE) 0.005 % ointment Apply topically 3 (three) times daily as needed. For itching 01/24/11  Yes Romero Belling, MD  gabapentin (NEURONTIN) 400 MG capsule Take 400 mg by mouth 2 (two) times daily.   Yes Historical Provider, MD  hydrALAZINE (APRESOLINE) 50 MG tablet Take 1 tablet (50 mg total) by mouth 3 (three) times daily. 12/15/11 12/14/12 Yes Laurey Morale, MD  insulin glargine (LANTUS) 100 UNIT/ML injection Inject 60 Units into the skin at bedtime. 10/05/11 10/04/12 Yes Newt Lukes, MD  insulin glulisine (APIDRA) 100 UNIT/ML injection Inject 27-29 Units into the skin 3 (three) times daily before meals. Takes 29 units in the am, 28 units midday & 27 units at night   Yes Historical Provider,  MD  Magnesium 200 MG TABS Take 1 tablet by mouth 2 (two) times daily. 1 tablet two times a day 05/29/12  Yes Laurey Morale, MD  metFORMIN (GLUCOPHAGE) 500 MG tablet Take 500-1,000 mg by mouth 3 (three) times daily between meals as needed. For elevated blood sugar   Yes Historical Provider, MD  nystatin (MYCOSTATIN) powder Apply topically 4 (four) times daily as needed. For topical infections 01/05/12 01/04/13 Yes Newt Lukes, MD  omega-3 acid ethyl esters (LOVAZA) 1 G capsule Take 2 g by mouth 2 (two) times daily.   Yes Historical Provider, MD  oxyCODONE-acetaminophen (PERCOCET/ROXICET) 5-325 MG per tablet Take 1 tablet by mouth every 6 (six) hours as needed. for pain 06/04/12  Yes Newt Lukes, MD  potassium chloride (K-DUR) 10 MEQ tablet Take 30 mEq by mouth 2 (two) times daily. 3 tablets (total 30 mEq) two times a day 05/29/12  Yes Laurey Morale, MD  rOPINIRole (REQUIP) 2 MG tablet Take 2 mg by mouth 2 (two) times daily. 06/27/12  Yes Newt Lukes, MD  torsemide (DEMADEX) 20 MG tablet Take 4 tablets (total 80mg ) daily at the same time in the morning 05/16/12  Yes Laurey Morale, MD  ONE [facility] ULTRA TEST test strip USE TO CHECK BLOOD GLUCOSE TWICE DAILY 02/21/12   Romero Belling, MD   Physical Exam: Filed Vitals:   07/11/12 1445 07/11/12 1604 07/11/12 1630 07/11/12 1745  BP:  131/49 123/56 124/42  Pulse:  53 46 57  Temp:  98.2 F (36.8 C)    TempSrc:  Oral    Resp:  18 17 22   Weight: 128.459 kg (283 lb 3.2 oz)     SpO2:  95% 95% 98%     General:  Arousable, oriented x3  Eyes: Pupil equal round react to light accommodation, extraocular movements intact  ENT: Clear pharynx  Neck: No significant thyromegaly  Cardiovascular: Bradycardic, regular, no murmurs  Respiratory: Clear to auscultation bilaterally  Abdomen: Distended, significant fluid subcutaneously, possible ascites, bowel sounds diminished  Skin: Excoriated and erythema both lower  extremities  Musculoskeletal: Significant peripheral edema upper extremities and lower extremities bilaterally  Psychiatric: Has a difficult time staying awake but seems to be euthymic  Neurologic: Able to move all 4 extremities, speech intact, repetition intact, memory intact  Labs on Admission:  Basic Metabolic Panel:  Recent Labs Lab 07/11/12 1443  NA 139  K 3.9  CL 99  CO2 24  GLUCOSE 187*  BUN 24*  CREATININE 0.70  CALCIUM 9.6   Liver Function Tests:  Recent Labs Lab 07/11/12 1443  AST 26  ALT 19  ALKPHOS 70  BILITOT 0.3  PROT 7.3  ALBUMIN 3.0*   No results found for this basename: LIPASE, AMYLASE,  in the last 168 hours No results found for this basename: AMMONIA,  in the  last 168 hours CBC:  Recent Labs Lab 07/11/12 1443  WBC 8.8  HGB 10.7*  HCT 34.4*  MCV 76.8*  PLT 279   Cardiac Enzymes: No results found for this basename: CKTOTAL, CKMB, CKMBINDEX, TROPONINI,  in the last 168 hours  BNP (last 3 results)  Recent Labs  05/25/12 1021 07/11/12 1443  PROBNP 23.0 127.9*   CBG: No results found for this basename: GLUCAP,  in the last 168 hours  Radiological Exams on Admission: Dg Chest 2 View  07/11/2012   *RADIOLOGY REPORT*  Clinical Data: Short of breath.  Fluid retention.  History of diastolic heart failure.  CHEST - 2 VIEW  Comparison: 12/31/2010  Findings: Mild cardiomegaly persists.  There is pulmonary venous hypertension without interstitial or alveolar edema.  No pleural fluid.  No focal parenchymal finding.  Ordinary degenerative changes effect the spine.  IMPRESSION: Chronic mild cardiomegaly.  Probable pulmonary venous hypertension. No frank edema.   Original Report Authenticated By: Paulina Fusi, M.D.      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 Anasarca-presumed due to congestive heart failure but this patient has been on massive doses of torsemide. Her last echocardiogram shows LVH and diastolic dysfunction grade 1 but preserved ejection fraction and no significant pulmonary hypertension. Will consider alternative diagnoses to explain anasarca-like hypothyroidism, proteinuria, cirrhosis - we'll obtain abdominal ultrasound to look for ascites, serum protein electrophoresis, urine protein / creatinine ratio and TSH. We will start a furosemide drip for diuresis. We'll involve the congestive heart failure team to help with management of this complicated patient 2. Hematochezia-painless-probably internal hemorrhoids-patient is to be monitored closely. Repeat CBC with admission. She'll be transfused if she continues to drop her hemoglobin. Patient will require a colonoscopy which should be done after stabilization 3. Diabetes mellitus type 2-will use Lantus and sliding scale insulin NovoLog 4. Morbid obesity 5. Bilateral cellulitis-we'll start vancomycin per pharmacy 6. Restless leg syndrome-we'll continue Requip 7. Fatty liver disease document abdominal ultrasound in 2009 - we'll repeat abdominal ultrasound 8. Positive d-dimer-probably from cellulitis-patient is not a candidate for empiric anticoagulation as she is actively bleeding. We'll get venous ultrasound to rule out DVT   Code Status: Full code (must indicate code status--if unknown or must be presumed, indicate so) Family Communication: Daughter at bedside (indicate person spoken with, if applicable, with phone number if by telephone) Disposition Plan: Home (indicate anticipated LOS)  Time spent: One hour  Zamiya Dillard [facility] Pager [phone]  If 7PM-7AM, please  contact night-coverage [email or web address] Password [email or web address] 07/11/2012, 6:28 PM

## 2012-07-11 NOTE — ED Notes (Signed)
Iv nurse at the bedside. Report given to [facility]

## 2012-07-12 ENCOUNTER — Inpatient Hospital Stay (HOSPITAL_COMMUNITY): Payer: Medicare Other

## 2012-07-12 DIAGNOSIS — R609 Edema, unspecified: Secondary | ICD-10-CM

## 2012-07-12 DIAGNOSIS — K921 Melena: Secondary | ICD-10-CM

## 2012-07-12 DIAGNOSIS — I509 Heart failure, unspecified: Secondary | ICD-10-CM

## 2012-07-12 DIAGNOSIS — E8779 Other fluid overload: Secondary | ICD-10-CM

## 2012-07-12 DIAGNOSIS — I5032 Chronic diastolic (congestive) heart failure: Secondary | ICD-10-CM

## 2012-07-12 DIAGNOSIS — I50812 Chronic right heart failure: Secondary | ICD-10-CM

## 2012-07-12 DIAGNOSIS — M7989 Other specified soft tissue disorders: Secondary | ICD-10-CM

## 2012-07-12 DIAGNOSIS — R0609 Other forms of dyspnea: Secondary | ICD-10-CM

## 2012-07-12 DIAGNOSIS — M79609 Pain in unspecified limb: Secondary | ICD-10-CM

## 2012-07-12 DIAGNOSIS — L0291 Cutaneous abscess, unspecified: Secondary | ICD-10-CM

## 2012-07-12 DIAGNOSIS — R0989 Other specified symptoms and signs involving the circulatory and respiratory systems: Secondary | ICD-10-CM

## 2012-07-12 LAB — GLUCOSE, CAPILLARY: Glucose-Capillary: 134 mg/dL — ABNORMAL HIGH (ref 70–99)

## 2012-07-12 LAB — CBC
HCT: 31.6 % — ABNORMAL LOW (ref 36.0–46.0)
Hemoglobin: 9.7 g/dL — ABNORMAL LOW (ref 12.0–15.0)
MCH: 23.8 pg — ABNORMAL LOW (ref 26.0–34.0)
MCHC: 30.7 g/dL (ref 30.0–36.0)
MCV: 77.6 fL — ABNORMAL LOW (ref 78.0–100.0)
Platelets: 271 K/uL (ref 150–400)
RBC: 4.07 MIL/uL (ref 3.87–5.11)
RDW: 17.4 % — ABNORMAL HIGH (ref 11.5–15.5)
WBC: 8.1 K/uL (ref 4.0–10.5)

## 2012-07-12 LAB — BASIC METABOLIC PANEL WITH GFR
BUN: 25 mg/dL — ABNORMAL HIGH (ref 6–23)
CO2: 29 meq/L (ref 19–32)
Calcium: 9.2 mg/dL (ref 8.4–10.5)
Chloride: 100 meq/L (ref 96–112)
Creatinine, Ser: 0.8 mg/dL (ref 0.50–1.10)
GFR calc Af Amer: 86 mL/min — ABNORMAL LOW
GFR calc non Af Amer: 75 mL/min — ABNORMAL LOW
Glucose, Bld: 150 mg/dL — ABNORMAL HIGH (ref 70–99)
Potassium: 3.4 meq/L — ABNORMAL LOW (ref 3.5–5.1)
Sodium: 143 meq/L (ref 135–145)

## 2012-07-12 LAB — BLOOD GAS, ARTERIAL
Acid-Base Excess: 8.4 mmol/L — ABNORMAL HIGH (ref 0.0–2.0)
Bicarbonate: 34 meq/L — ABNORMAL HIGH (ref 20.0–24.0)
Drawn by: 277551
O2 Content: 3 L/min
O2 Saturation: 100 %
Patient temperature: 98.6
TCO2: 36 mmol/L (ref 0–100)
pCO2 arterial: 63.8 mmHg (ref 35.0–45.0)
pH, Arterial: 7.346 — ABNORMAL LOW (ref 7.350–7.450)
pO2, Arterial: 164 mmHg — ABNORMAL HIGH (ref 80.0–100.0)

## 2012-07-12 MED ORDER — GABAPENTIN 400 MG PO CAPS
400.0000 mg | ORAL_CAPSULE | Freq: Two times a day (BID) | ORAL | Status: DC
  Administered 2012-07-12 – 2012-07-16 (×9): 400 mg via ORAL
  Filled 2012-07-12 (×10): qty 1

## 2012-07-12 MED ORDER — POTASSIUM CHLORIDE CRYS ER 20 MEQ PO TBCR
30.0000 meq | EXTENDED_RELEASE_TABLET | Freq: Two times a day (BID) | ORAL | Status: DC
  Administered 2012-07-12 – 2012-07-13 (×4): 30 meq via ORAL
  Filled 2012-07-12 (×6): qty 1

## 2012-07-12 NOTE — Telephone Encounter (Signed)
Phone call to Selena Batten today and she states pt ended up going to the hospital yesterday due to her lower abdomen and feet swelling along with SOB lying down and rectal bleeding. Kim states pt did not look well at all.

## 2012-07-12 NOTE — Consult Note (Signed)
CARDIOLOGY CONSULT NOTE  Patient ID: Melissa Myers, MRN: [id number], DOB/AGE: 67/03/1945 67 y.o. Admit date: 07/11/2012 Date of Consult: 07/12/2012  Primary Physician: Rene Paci, MD Primary Cardiologist: Dr. Shirlee Latch Referring Physician: Dr. Lavera Guise  Chief Complaint: Shortness of breath Reason for Consultation: CHF  HPI: 67 y.o. female w/ PMHx significant for CHF (EF 65-70%, grade 1 diastolic dysfunction), DM, HTN, HL, who presented to [facility] on 07/11/2012 with complaints of dyspnea and LE edema.  The patient notes a 1-2 month history of worsening orthopnea requiring her to sleep upright in a recliner (at baseline 2-pillow orthopnea), worsening PND, and worsening LE edema to above her knees, as well as dyspnea when walking across a room (about her baseline).  She believes that her UOP has decreased somewhat, despite reported compliance with home Torsemide 80 mg daily.  She notes no chest pain or palpitations.  She notes bilateral lower extremity redness for the last > 1 year, with no recent changes, no fevers/chills at home.  The patient was started on a lasix drip at 4 mg/hr, and has had a total UOP of 2425 mL, and a loss of 3 pounds.  She notes no significant improvement in symptoms compared to yesterday.  We were consulted to assist with management of CHF.  Past Medical History  Diagnosis Date  . Type II or unspecified type diabetes mellitus without mention of complication, not stated as uncontrolled     insulin dep  . Hyperlipidemia     hx rhabdo on statins  . Hypertension   . CHF (congestive heart failure)     diastolic  . OSA (obstructive sleep apnea)     insignificant apnea events 05/2009 sleep study  . Chronic diarrhea     a/w nausea - felt related to IBS  . RLS (restless legs syndrome)   . Osteoarthritis   . Stasis dermatitis   . Shortness of breath   . GERD (gastroesophageal reflux disease)   . Anemia       Surgical History:  Past Surgical History   Procedure Laterality Date  . Tubal ligation  1980  . Cholecystectomy  1997  . Tonsillectomy  1970  . Uterine polyp removal  2008  . Umbilical hernia repair  1995     Home Meds: Prior to Admission medications   Medication Sig Start Date End Date Taking? Authorizing Provider  aspirin 81 MG tablet Take 81 mg by mouth daily.    Yes Historical Provider, MD  carvedilol (COREG) 25 MG tablet 1 and 1/2 tablets (total 37.5mg ) two times a day 05/29/12  Yes Laurey Morale, MD  cephALEXin (KEFLEX) 500 MG capsule Take 500 mg by mouth 4 (four) times daily.   Yes Historical Provider, MD  Coenzyme Q10 (COQ10) 30 MG CAPS Take 1 capsule by mouth daily.    Yes Historical Provider, MD  fenofibrate 160 MG tablet Take 1 tablet (160 mg total) by mouth daily. 01/18/12  Yes Newt Lukes, MD  flyticasone (CUTIVATE) 0.005 % ointment Apply topically 3 (three) times daily as needed. For itching 01/24/11  Yes Romero Belling, MD  gabapentin (NEURONTIN) 400 MG capsule Take 400 mg by mouth 2 (two) times daily.   Yes Historical Provider, MD  hydrALAZINE (APRESOLINE) 50 MG tablet Take 1 tablet (50 mg total) by mouth 3 (three) times daily. 12/15/11 12/14/12 Yes Laurey Morale, MD  insulin glargine (LANTUS) 100 UNIT/ML injection Inject 60 Units into the skin at bedtime. 10/05/11 10/04/12 Yes Newt Lukes,  MD  insulin glulisine (APIDRA) 100 UNIT/ML injection Inject 27-29 Units into the skin 3 (three) times daily before meals. Takes 29 units in the am, 28 units midday & 27 units at night   Yes Historical Provider, MD  Magnesium 200 MG TABS Take 1 tablet by mouth 2 (two) times daily. 1 tablet two times a day 05/29/12  Yes Laurey Morale, MD  metFORMIN (GLUCOPHAGE) 500 MG tablet Take 500-1,000 mg by mouth 3 (three) times daily between meals as needed. For elevated blood sugar   Yes Historical Provider, MD  nystatin (MYCOSTATIN) powder Apply topically 4 (four) times daily as needed. For topical infections 01/05/12 01/04/13 Yes  Newt Lukes, MD  omega-3 acid ethyl esters (LOVAZA) 1 G capsule Take 2 g by mouth 2 (two) times daily.   Yes Historical Provider, MD  oxyCODONE-acetaminophen (PERCOCET/ROXICET) 5-325 MG per tablet Take 1 tablet by mouth every 6 (six) hours as needed. for pain 06/04/12  Yes Newt Lukes, MD  potassium chloride (K-DUR) 10 MEQ tablet Take 30 mEq by mouth 2 (two) times daily. 3 tablets (total 30 mEq) two times a day 05/29/12  Yes Laurey Morale, MD  rOPINIRole (REQUIP) 2 MG tablet Take 2 mg by mouth 2 (two) times daily. 06/27/12  Yes Newt Lukes, MD  torsemide (DEMADEX) 20 MG tablet Take 4 tablets (total 80mg ) daily at the same time in the morning 05/16/12  Yes Laurey Morale, MD  ONE [facility] ULTRA TEST test strip USE TO CHECK BLOOD GLUCOSE TWICE DAILY 02/21/12   Romero Belling, MD    Inpatient Medications:  . aspirin  81 mg Oral Daily  . carvedilol  25 mg Oral BID WC  . gabapentin  400 mg Oral BID  . heparin  5,000 Units Subcutaneous Q8H  . insulin aspart  0-20 Units Subcutaneous TID WC  . insulin aspart  0-5 Units Subcutaneous QHS  . insulin aspart  6 Units Subcutaneous TID WC  . insulin glargine  35 Units Subcutaneous QHS  . nystatin  5 mL Oral QID  . rOPINIRole  2 mg Oral BID  . rOPINIRole  4 mg Oral QHS  . sodium chloride  3 mL Intravenous Q12H  . vancomycin  1,250 mg Intravenous Q12H   . furosemide (LASIX) infusion 4 mg/hr (07/11/12 2041)    Allergies:  Allergies  Allergen Reactions  . Morphine     REACTION: GI upset and headaches  . Sulfa Antibiotics Diarrhea  . Sulfonamide Derivatives   . Zinc Swelling and Rash    History   Social History  . Marital Status: Married    Spouse Name: N/A    Number of Children: N/A  . Years of Education: N/A   Occupational History  . Not on file.   Social History Main Topics  . Smoking status: Never Smoker   . Smokeless tobacco: Never Used  . Alcohol Use: No  . Drug Use: No  . Sexually Active: No   Other Topics  Concern  . Not on file   Social History Narrative   Lives with spouse and mother. Retired [employer] operational officer, now housewife     Family History  Problem Relation Age of Onset  . Heart disease Mother 43    MI  . Heart disease Father 81    MI  . Heart disease Other     MI- uncle and multiple aunts (aunts with CVAs)  . Cancer Other     Stomach cancer grandmother  Review of Systems: General: negative for chills, fever, night sweats or weight changes.  ENT: negative for rhinorrhea or epistaxis Cardiovascular: see HPI Dermatological: negative for rash Respiratory: no cough, wheezing GI: negative for nausea, vomiting, diarrhea, bright red blood per rectum, melena, or hematemesis GU: no hematuria, urgency, or frequency Neurologic: negative for visual changes, syncope, headache, or dizziness Heme: no easy bruising or bleeding Endo: negative for excessive thirst, thyroid disorder, or flushing Musculoskeletal: negative for joint pain or swelling, negative for myalgias All other systems reviewed and are otherwise negative except as noted above.  Physical Exam: Blood pressure 149/62, pulse 56, temperature 98.1 F (36.7 C), temperature source Oral, resp. rate 18, height 5\' 2"  (1.575 m), weight 280 lb 3.3 oz (127.1 kg), SpO2 99.00%. General: Well developed, well nourished, alert and oriented, appears uncomfortable HEENT: Normocephalic, atraumatic, sclera non-icteric, no xanthomas, nares are without discharge.  Neck: Supple. Carotids 2+ without bruits. Elevated JVP Lungs: Distant breath sounds, mildly increased work of respiration Heart: Distant heart sounds, grade II/VI systolic murmur best heard at right upper sternal border Abdomen: round, non-tender, non-distended with normoactive bowel sounds. No hepatomegaly. No rebound/guarding. No obvious abdominal masses. Back: No CVA tenderness Msk:  Strength and tone appear normal for age. Extremities: 2+ bilateral pitting edema Neuro: CNII-XII  intact, moves all extremities spontaneously. Psych:  Responds to questions appropriately with a normal affect.    Labs: No results found for this basename: CKTOTAL, CKMB, TROPONINI,  in the last 72 hours Lab Results  Component Value Date   WBC 8.1 07/12/2012   HGB 9.7* 07/12/2012   HCT 31.6* 07/12/2012   MCV 77.6* 07/12/2012   PLT 271 07/12/2012    Recent Labs Lab 07/11/12 1443 07/12/12 0545  NA 139 143  K 3.9 3.4*  CL 99 100  CO2 24 29  BUN 24* 25*  CREATININE 0.70 0.80  CALCIUM 9.6 9.2  PROT 7.3  --   BILITOT 0.3  --   ALKPHOS 70  --   ALT 19  --   AST 26  --   GLUCOSE 187* 150*   Lab Results  Component Value Date   CHOL 210* 06/08/2012   HDL 42.60 06/08/2012   LDLCALC 115 12/02/2009   TRIG 187.0* 06/08/2012   Lab Results  Component Value Date   DDIMER 1.06* 07/11/2012    Radiology/Studies:  Dg Chest 2 View  07/11/2012   *RADIOLOGY REPORT*  Clinical Data: Short of breath.  Fluid retention.  History of diastolic heart failure.  CHEST - 2 VIEW  Comparison: 12/31/2010  Findings: Mild cardiomegaly persists.  There is pulmonary venous hypertension without interstitial or alveolar edema.  No pleural fluid.  No focal parenchymal finding.  Ordinary degenerative changes effect the spine.  IMPRESSION: Chronic mild cardiomegaly.  Probable pulmonary venous hypertension. No frank edema.   Original Report Authenticated By: Paulina Fusi, M.D.   US Abdomen Complete  07/12/2012   *RADIOLOGY REPORT*  Clinical Data:  Obesity, renal insufficiency, diabetes, evaluate for ascites and cirrhosis  COMPLETE ABDOMINAL ULTRASOUND  Comparison:  CT abdomen of 01/13/2008  Findings:  Gallbladder:  The gallbladder has previously been resected.  Common bile duct:  The common bile duct is normal measuring 6.0 mm in diameter.  Liver:  The echogenicity of the liver parenchyma has increased consistent with fatty infiltration of possible cirrhosis.  The contours of the liver are slightly nodular also suggesting  cirrhosis.  No ductal dilatation is seen.  IVC:  Appears normal.  Pancreas:  The tail  of the pancreas is obscured by bowel gas.  Spleen:  The spleen is normal measuring 7.3 cm sagittally.  Right Kidney:  No hydronephrosis is seen.  The right kidney measures 13.5 cm sagittally.  Left Kidney:  No hydronephrosis is noted.  The left kidney measures 12.7 cm.  Abdominal aorta:  The abdominal aorta is normal in caliber although obscured in the mid distal aspect by bowel gas.  This study is somewhat compromised by patient body habitus and bowel gas.  No ascites is evident.  IMPRESSION:  1.  Changes of fatty infiltration of the liver and probable cirrhosis with somewhat irregular margins of the liver.  No ductal dilatation. 2.  The spleen is normal in size. 3.  The tail of the pancreas is obscured by bowel gas. 4.  No ascites.  Some of the anatomy is obscured by body habitus and bowel gas.   Original Report Authenticated By: Dwyane Dee, M.D.    EKG:   ASSESSMENT AND PLAN:  The patient is a 67 yo woman, history of diastolic CHF, presenting with worsening orthopnea, PND, and LE edema, with an approximately 16 pound weight gain.  # Acute on Chronic Diastolic CHF - the patient has a history of CHF, with EF 65-70%, and grade 1 diastolic dysfunction, with mild dynamic LV obstruction (echo 05/25/12).  The patient presently shows evidence of LE edema, elevated JVP, and 16-lb weight gain.  The patient was started on a lasix drip at 4 mg/hr, with good UOP, and no significant elevation in creatinine. -increase lasix drip to 8 mg/hr -monitor strict I/O's, daily weights -monitor daily BMETs -restart home K-dur 30 mEq BID -continue coreg 25 mg BID  # DM2 - the patient's A1C is 8.7 (09/2011).  CBG's have been well-controlled during this hospitalization -per primary team, on Lantus and SSI -on gabapentin for neuropathy  # Restless Leg Syndrome - chronic, stable -on ropinirole  # LE erythema - the patient notes stable  erythema for > 1 year, with no fever or leukocytosis.  Unclear if this represents chronic venous stasis vs lipodermatosclerosis vs ?cellulitis. -per primary team, currently receiving vanc  # Hematochezia - likely represents hemorrhoids -per primary team  # Morbid obesity # Fatty liver disease  Signed, Janalyn Harder  07/12/2012, 1:29 PM   Attending Note:   The patient was seen and examined.  Agree with assessment and plan as noted above.  Changes made to the above note as needed.  Pt was lethargic when I saw her this afternoon - much less interactive than when she was examined by Dr. Manson Passey.  She has signs of chronic hypoventilation with subsequent R heart failure.  She has obesity hypoventilation, sleep apnea (and does not wear her CPAP mask) .  Her LV function has been normal.  She has a very mild LVOT obstruction.  I agree with the above recommendations to increase the lasix drip.   Her creatinine is still normal.  I would measure a ABG - she may have CO 2 narcosis.  Will also check a CBG.   No further recs.   Vesta Mixer, Montez Hageman., MD, [facility] 07/12/2012, 2:42 PM

## 2012-07-12 NOTE — Progress Notes (Signed)
Utilization Review Completed.   Johnathon Olden, RN, BSN Nurse Case Manager  [phone]  

## 2012-07-12 NOTE — Progress Notes (Signed)
VASCULAR LAB PRELIMINARY  PRELIMINARY  PRELIMINARY  PRELIMINARY  Bilateral lower extremity venous Dopplers completed.    Preliminary report:  There is no obvious evidence of DVT or SVT noted in the bilateral lower extremities.  Study was technically difficult secondary to body habitus.  Azai Gaffin, RVT 07/12/2012, 3:34 PM

## 2012-07-12 NOTE — Progress Notes (Signed)
Pt HR sustaining btwn 45-49, HR dropping down to 35 non-sustained. MD notified and no news orders. Pt BP 136/49, pulse 47, O2 Sat 88 on RA. Pt with obstructive sleep apnea. Pt placed on 2L O2, Pt O2 sat 97%. Will continue to monitor and assess pt. Baron Hamper, RN 07/12/2012 12:44 AM

## 2012-07-12 NOTE — Progress Notes (Signed)
[facility] PROGRESS NOTE  Melissa Myers WUJ:[id number] DOB: Dec 27, 1945 DOA: 07/11/2012 PCP: Rene Paci, MD  Assessment/Plan: # Acute on Chronic Diastolic CHF - the patient has a history of CHF, with EF 65-70%, and grade 1 diastolic dysfunction, with mild dynamic LV obstruction (echo 05/25/12). The patient presently shows evidence of worsen LE edema, elevated JVP, and 16-lb weight gain.  -per cardiology rec's will continue lasix drip but will increase to 8 mg/hr  -monitor strict I/O's, daily weights  -monitor daily BMETs  -restart home K-dur 30 mEq BID (mildly hypokalemic)  -continue coreg 25 mg BID   # DM2 - the patient's A1C is 8.7 (09/2011). CBG's have been well-controlled during this hospitalization. -will continue Lantus and SSI   # Diabetic Neuropathy: continue gabapentin for neuropathy   # Restless Leg Syndrome - chronic, stable  -on ropinirole   # LE erythema - the patient notes stable erythema for > 1 year, with no fever or leukocytosis. Unclear if this represents chronic venous stasis vs ?cellulitis.  -currently receiving vanc  -will continue for now -LE dopplers to r/o DVT or SVT  # Hematochezia - likely represents hemorrhoids  -will follow Hgb trend -if bleeding continues will involved GI when patient more stable for colonoscopy  #Lethargy: concern for hypercapnia give hx of sleep apnea and obesity hypoventilation syndrome. Will check ABG   Code Status: Full Family Communication: no family at bedside Disposition Plan: to be determine. Continue telemetry   Consultants:  cardiology  Procedures:  See below for x-ray reports  Antibiotics:  vanc  HPI/Subjective: Somnolent/lethargic, afebrile, no CP; patient is still complaining of SOB  Objective: Filed Vitals:   07/12/12 0140 07/12/12 0435 07/12/12 1100 07/12/12 1404  BP: 138/54 142/61 149/62 120/46  Pulse: 62 66 56 59  Temp: 98 F (36.7 C) 98.1 F (36.7 C)  98.2 F (36.8 C)  TempSrc:  Oral Oral  Oral  Resp: 18 18  20   Height:      Weight:  127.1 kg (280 lb 3.3 oz)    SpO2: 96% 97% 99% 96%    Intake/Output Summary (Last 24 hours) at 07/12/12 1455 Last data filed at 07/12/12 1404  Gross per 24 hour  Intake    603 ml  Output   3027 ml  Net  -2424 ml   Filed Weights   07/11/12 1445 07/11/12 1954 07/12/12 0435  Weight: 128.459 kg (283 lb 3.2 oz) 127.2 kg (280 lb 6.8 oz) 127.1 kg (280 lb 3.3 oz)    Exam:   General:  NAD; somnolent/lethargic on exam  Cardiovascular: S1 and S2, no rubs or gallops  Respiratory: CTA   Abdomen: soft, NT, ND, positive BS  Musculoskeletal: 3++ edema bilaterally; chronic stasis dermatitis changes  Neuro: lethargic/somnolent; no focal deficit; when awaken she is appropriate.  Data Reviewed: Basic Metabolic Panel:  Recent Labs Lab 07/11/12 1443 07/12/12 0545  NA 139 143  K 3.9 3.4*  CL 99 100  CO2 24 29  GLUCOSE 187* 150*  BUN 24* 25*  CREATININE 0.70 0.80  CALCIUM 9.6 9.2   Liver Function Tests:  Recent Labs Lab 07/11/12 1443  AST 26  ALT 19  ALKPHOS 70  BILITOT 0.3  PROT 7.3  ALBUMIN 3.0*    Recent Labs Lab 07/11/12 2015  AMMONIA 37   CBC:  Recent Labs Lab 07/11/12 1443 07/11/12 2015 07/12/12 0545  WBC 8.8 8.3 8.1  HGB 10.7* 10.3* 9.7*  HCT 34.4* 32.9* 31.6*  MCV 76.8* 76.9* 77.6*  PLT 279 271 271   BNP (last 3 results)  Recent Labs  05/25/12 1021 07/11/12 1443  PROBNP 23.0 127.9*   CBG:  Recent Labs Lab 07/11/12 1947 07/12/12 0551 07/12/12 1114  GLUCAP 131* 134* 149*    Studies: Dg Chest 2 View  07/11/2012   *RADIOLOGY REPORT*  Clinical Data: Short of breath.  Fluid retention.  History of diastolic heart failure.  CHEST - 2 VIEW  Comparison: 12/31/2010  Findings: Mild cardiomegaly persists.  There is pulmonary venous hypertension without interstitial or alveolar edema.  No pleural fluid.  No focal parenchymal finding.  Ordinary degenerative changes effect the spine.   IMPRESSION: Chronic mild cardiomegaly.  Probable pulmonary venous hypertension. No frank edema.   Original Report Authenticated By: Paulina Fusi, M.D.   US Abdomen Complete  07/12/2012   *RADIOLOGY REPORT*  Clinical Data:  Obesity, renal insufficiency, diabetes, evaluate for ascites and cirrhosis  COMPLETE ABDOMINAL ULTRASOUND  Comparison:  CT abdomen of 01/13/2008  Findings:  Gallbladder:  The gallbladder has previously been resected.  Common bile duct:  The common bile duct is normal measuring 6.0 mm in diameter.  Liver:  The echogenicity of the liver parenchyma has increased consistent with fatty infiltration of possible cirrhosis.  The contours of the liver are slightly nodular also suggesting cirrhosis.  No ductal dilatation is seen.  IVC:  Appears normal.  Pancreas:  The tail of the pancreas is obscured by bowel gas.  Spleen:  The spleen is normal measuring 7.3 cm sagittally.  Right Kidney:  No hydronephrosis is seen.  The right kidney measures 13.5 cm sagittally.  Left Kidney:  No hydronephrosis is noted.  The left kidney measures 12.7 cm.  Abdominal aorta:  The abdominal aorta is normal in caliber although obscured in the mid distal aspect by bowel gas.  This study is somewhat compromised by patient body habitus and bowel gas.  No ascites is evident.  IMPRESSION:  1.  Changes of fatty infiltration of the liver and probable cirrhosis with somewhat irregular margins of the liver.  No ductal dilatation. 2.  The spleen is normal in size. 3.  The tail of the pancreas is obscured by bowel gas. 4.  No ascites.  Some of the anatomy is obscured by body habitus and bowel gas.   Original Report Authenticated By: Dwyane Dee, M.D.    Scheduled Meds: . aspirin  81 mg Oral Daily  . carvedilol  25 mg Oral BID WC  . gabapentin  400 mg Oral BID  . heparin  5,000 Units Subcutaneous Q8H  . insulin aspart  0-20 Units Subcutaneous TID WC  . insulin aspart  0-5 Units Subcutaneous QHS  . insulin aspart  6 Units  Subcutaneous TID WC  . insulin glargine  35 Units Subcutaneous QHS  . nystatin  5 mL Oral QID  . potassium chloride  30 mEq Oral BID  . rOPINIRole  2 mg Oral BID  . rOPINIRole  4 mg Oral QHS  . sodium chloride  3 mL Intravenous Q12H  . vancomycin  1,250 mg Intravenous Q12H   Continuous Infusions: . 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 If 7PM-7AM, please contact night-coverage at [email or web address], password [facility] 07/12/2012, 2:55 PM  LOS: 1 day

## 2012-07-12 NOTE — Plan of Care (Signed)
Problem: Phase I Progression Outcomes Goal: EF % per last Echo/documented,Core Reminder form on chart Outcome: Completed/Met Date Met:  07/12/12 Pt EF 65-70% per echo done on 05/25/2012 Goal: Up in chair, BRP Outcome: Progressing Pt OOB to BSC with 1 asst, pt unsteady on feet

## 2012-07-12 NOTE — Progress Notes (Signed)
CRITICAL VALUE ALERT  Critical value received:  PH 7.346; CO2 63.8; PO2 164  Date of notification: 07/12/12  Time of notification: 1652  Critical value read back: yes  Nurse who received alert:  Junie Panning  MD notified (1st page):  [place]  Time of first page:  [phone]  MD notified (2nd page):  Time of second page:  Responding MD:  Gwenlyn Perking  Time MD responded:  [phone]

## 2012-07-13 DIAGNOSIS — G4730 Sleep apnea, unspecified: Secondary | ICD-10-CM

## 2012-07-13 DIAGNOSIS — G2581 Restless legs syndrome: Secondary | ICD-10-CM

## 2012-07-13 DIAGNOSIS — I8310 Varicose veins of unspecified lower extremity with inflammation: Secondary | ICD-10-CM

## 2012-07-13 LAB — GLUCOSE, CAPILLARY
Glucose-Capillary: 119 mg/dL — ABNORMAL HIGH (ref 70–99)
Glucose-Capillary: 209 mg/dL — ABNORMAL HIGH (ref 70–99)
Glucose-Capillary: 217 mg/dL — ABNORMAL HIGH (ref 70–99)

## 2012-07-13 LAB — BASIC METABOLIC PANEL
Chloride: 98 mEq/L (ref 96–112)
Creatinine, Ser: 0.85 mg/dL (ref 0.50–1.10)
GFR calc Af Amer: 80 mL/min — ABNORMAL LOW (ref >90)
GFR calc non Af Amer: 69 mL/min — ABNORMAL LOW (ref >90)
Potassium: 3.8 mEq/L (ref 3.5–5.1)

## 2012-07-13 LAB — PROTEIN ELECTROPHORESIS, SERUM
Albumin ELP: 46.5 % — ABNORMAL LOW (ref 55.8–66.1)
Beta 2: 7.2 % — ABNORMAL HIGH (ref 3.2–6.5)
Beta Globulin: 11.3 % — ABNORMAL HIGH (ref 4.7–7.2)

## 2012-07-13 MED ORDER — DOXYCYCLINE HYCLATE 100 MG PO TABS
100.0000 mg | ORAL_TABLET | Freq: Two times a day (BID) | ORAL | Status: DC
  Administered 2012-07-13 – 2012-07-16 (×6): 100 mg via ORAL
  Filled 2012-07-13 (×7): qty 1

## 2012-07-13 NOTE — Progress Notes (Signed)
RT attempted to place patient on CPAP, she refused to wear it. She said she has a Home CPAP but she has not used it in over a year because she is claustrophobic.

## 2012-07-13 NOTE — Progress Notes (Signed)
Patient evaluated for community based chronic disease management services with [facility] Care Management Program as a benefit of patient's [employer]. Patient will receive a post discharge transition of care call and will be evaluated for monthly home visits for assessments and disease process education. Spoke with patient at bedside to explain [facility] Care Management services.  Patient and spouse feel that they could benefit from pharmacy services because they are paying nearly $600 a month for insulin and diabetes supplies. Consents obtained. Left contact information and [facility] literature at bedside. Made inpatient Case Manager aware that [facility] Care Management following. Of note, [facility] Care Management services does not replace or interfere with any services that are arranged by inpatient case management or social work.  For additional questions or referrals please contact Anibal Henderson BSN RN [facility] Liaison at [phone].

## 2012-07-13 NOTE — Progress Notes (Signed)
[facility] PROGRESS NOTE  Melissa Myers NWG:[id number] DOB: [birth year] DOA: 07/11/2012 PCP: Rene Paci, MD  Assessment/Plan: # Acute on Chronic Diastolic CHF - the patient has a history of CHF, with EF 65-70%, and grade 1 diastolic dysfunction, with mild dynamic LV obstruction (echo 05/25/12). The patient presently shows evidence of worsen LE edema, elevated JVP, and 16-lb weight gain.  -per cardiology rec's will continue lasix drip at 8 mg/hr  -monitor strict I/O's, daily weights  -monitor daily BMETs  -restart home K-dur 30 mEq BID -continue coreg 25 mg BID  -patient is now breathing better and &L negative since admission.  # DM2 - the patient's A1C is 8.7 (09/2011). CBG's have been well-controlled during this hospitalization. -will continue Lantus and SSI   # Diabetic Neuropathy: continue gabapentin for neuropathy   # Restless Leg Syndrome - chronic, stable  -on ropinirole   # LE erythema - the patient notes stable erythema for > 1 year, with no fever or leukocytosis. Unclear if this represents chronic venous stasis vs ?cellulitis.  -will discontinue vanc and treat with PO doxycycline -LE dopplers neg for DVT or SVT  # Hematochezia - likely represents hemorrhoids  -Hgb stable and in not need for transfusion; will follow. -will benefit of colonoscopy at discharge  # Intermittent Lethargy: CO2 in the 60's range on ABG. Patient educated about importance for CPAP compliance and is willing to give it a try today. RT to hook patient up at bedtime  Code Status: Full Family Communication: no family at bedside Disposition Plan: to be determine. Continue telemetry   Consultants:  cardiology  Procedures:  See below for x-ray reports  Antibiotics:  vanc  HPI/Subjective: No CP, no SOB, afebrile; reports breathing is improving.  Objective: Filed Vitals:   07/13/12 0228 07/13/12 0428 07/13/12 1015 07/13/12 1400  BP:  134/62 148/50 145/48  Pulse: 55 62 58 58   Temp:  98.1 F (36.7 C)  98.8 F (37.1 C)  TempSrc:  Oral  Oral  Resp:  20  20  Height:      Weight:  126.5 kg (278 lb 14.1 oz)    SpO2: 95% 95%  96%    Intake/Output Summary (Last 24 hours) at 07/13/12 1654 Last data filed at 07/13/12 1545  Gross per 24 hour  Intake    843 ml  Output   6200 ml  Net  -5357 ml   Filed Weights   07/11/12 1954 07/12/12 0435 07/13/12 0428  Weight: 127.2 kg (280 lb 6.8 oz) 127.1 kg (280 lb 3.3 oz) 126.5 kg (278 lb 14.1 oz)    Exam:   General:  NAD; somnolent/lethargic on exam  Cardiovascular: S1 and S2, no rubs or gallops  Respiratory: CTA   Abdomen: soft, NT, ND, positive BS  Musculoskeletal: 3++ edema bilaterally; chronic stasis dermatitis changes  Neuro: AAOX3; no focal deficit; no focal sensory deficit  Data Reviewed: Basic Metabolic Panel:  Recent Labs Lab 07/11/12 1443 07/12/12 0545 07/13/12 0856  NA 139 143 144  K 3.9 3.4* 3.8  CL 99 100 98  CO2 24 29 37*  GLUCOSE 187* 150* 206*  BUN 24* 25* 20  CREATININE 0.70 0.80 0.85  CALCIUM 9.6 9.2 9.1   Liver Function Tests:  Recent Labs Lab 07/11/12 1443  AST 26  ALT 19  ALKPHOS 70  BILITOT 0.3  PROT 7.3  ALBUMIN 3.0*    Recent Labs Lab 07/11/12 2015  AMMONIA 37   CBC:  Recent Labs Lab 07/11/12 1443  07/11/12 2015 07/12/12 0545  WBC 8.8 8.3 8.1  HGB 10.7* 10.3* 9.7*  HCT 34.4* 32.9* 31.6*  MCV 76.8* 76.9* 77.6*  PLT 279 271 271   BNP (last 3 results)  Recent Labs  05/25/12 1021 07/11/12 1443  PROBNP 23.0 127.9*   CBG:  Recent Labs Lab 07/12/12 1627 07/12/12 2103 07/13/12 0635 07/13/12 1055 07/13/12 1637  GLUCAP 169* 199* 119* 209* 151*    Studies: US Abdomen Complete  07/12/2012   *RADIOLOGY REPORT*  Clinical Data:  Obesity, renal insufficiency, diabetes, evaluate for ascites and cirrhosis  COMPLETE ABDOMINAL ULTRASOUND  Comparison:  CT abdomen of 01/13/2008  Findings:  Gallbladder:  The gallbladder has previously been resected.   Common bile duct:  The common bile duct is normal measuring 6.0 mm in diameter.  Liver:  The echogenicity of the liver parenchyma has increased consistent with fatty infiltration of possible cirrhosis.  The contours of the liver are slightly nodular also suggesting cirrhosis.  No ductal dilatation is seen.  IVC:  Appears normal.  Pancreas:  The tail of the pancreas is obscured by bowel gas.  Spleen:  The spleen is normal measuring 7.3 cm sagittally.  Right Kidney:  No hydronephrosis is seen.  The right kidney measures 13.5 cm sagittally.  Left Kidney:  No hydronephrosis is noted.  The left kidney measures 12.7 cm.  Abdominal aorta:  The abdominal aorta is normal in caliber although obscured in the mid distal aspect by bowel gas.  This study is somewhat compromised by patient body habitus and bowel gas.  No ascites is evident.  IMPRESSION:  1.  Changes of fatty infiltration of the liver and probable cirrhosis with somewhat irregular margins of the liver.  No ductal dilatation. 2.  The spleen is normal in size. 3.  The tail of the pancreas is obscured by bowel gas. 4.  No ascites.  Some of the anatomy is obscured by body habitus and bowel gas.   Original Report Authenticated By: Dwyane Dee, M.D.    Scheduled Meds: . aspirin  81 mg Oral Daily  . carvedilol  25 mg Oral BID WC  . doxycycline  100 mg Oral Q12H  . gabapentin  400 mg Oral BID  . heparin  5,000 Units Subcutaneous Q8H  . insulin aspart  0-20 Units Subcutaneous TID WC  . insulin aspart  0-5 Units Subcutaneous QHS  . insulin aspart  6 Units Subcutaneous TID WC  . insulin glargine  35 Units Subcutaneous QHS  . nystatin  5 mL Oral QID  . potassium chloride  30 mEq Oral BID  . rOPINIRole  2 mg Oral BID  . rOPINIRole  4 mg Oral QHS  . sodium chloride  3 mL Intravenous Q12H   Continuous Infusions: . 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 If 7PM-7AM, please contact night-coverage at [email or web address], password [facility] 07/13/2012, 4:54 PM  LOS: 2 days

## 2012-07-14 DIAGNOSIS — E1065 Type 1 diabetes mellitus with hyperglycemia: Secondary | ICD-10-CM

## 2012-07-14 DIAGNOSIS — I10 Essential (primary) hypertension: Secondary | ICD-10-CM

## 2012-07-14 DIAGNOSIS — E1029 Type 1 diabetes mellitus with other diabetic kidney complication: Secondary | ICD-10-CM

## 2012-07-14 DIAGNOSIS — R011 Cardiac murmur, unspecified: Secondary | ICD-10-CM

## 2012-07-14 LAB — BASIC METABOLIC PANEL
Chloride: 98 mEq/L (ref 96–112)
GFR calc Af Amer: 86 mL/min — ABNORMAL LOW (ref >90)
GFR calc non Af Amer: 75 mL/min — ABNORMAL LOW (ref >90)
Potassium: 3.8 mEq/L (ref 3.5–5.1)

## 2012-07-14 LAB — GLUCOSE, CAPILLARY
Glucose-Capillary: 192 mg/dL — ABNORMAL HIGH (ref 70–99)
Glucose-Capillary: 191 mg/dL — ABNORMAL HIGH (ref 70–99)

## 2012-07-14 MED ORDER — TORSEMIDE 20 MG PO TABS
40.0000 mg | ORAL_TABLET | Freq: Two times a day (BID) | ORAL | Status: DC
  Administered 2012-07-14 – 2012-07-15 (×3): 40 mg via ORAL
  Filled 2012-07-14 (×4): qty 2

## 2012-07-14 MED ORDER — CAMPHOR-MENTHOL 0.5-0.5 % EX LOTN
TOPICAL_LOTION | CUTANEOUS | Status: DC | PRN
  Administered 2012-07-14: 19:00:00 via TOPICAL
  Filled 2012-07-14: qty 222

## 2012-07-14 MED ORDER — ACETAZOLAMIDE 250 MG PO TABS
250.0000 mg | ORAL_TABLET | Freq: Once | ORAL | Status: AC
  Administered 2012-07-14: 250 mg via ORAL
  Filled 2012-07-14: qty 1

## 2012-07-14 MED ORDER — POTASSIUM CHLORIDE CRYS ER 20 MEQ PO TBCR
20.0000 meq | EXTENDED_RELEASE_TABLET | Freq: Two times a day (BID) | ORAL | Status: DC
  Administered 2012-07-14 – 2012-07-16 (×5): 20 meq via ORAL
  Filled 2012-07-14: qty 1
  Filled 2012-07-14: qty 2
  Filled 2012-07-14 (×3): qty 1

## 2012-07-14 NOTE — Progress Notes (Signed)
[facility] PROGRESS NOTE  Melissa Myers ZOX:[id number] DOB: 12-28-1945 DOA: 07/11/2012 PCP: Rene Paci, MD  Assessment/Plan: # Acute on Chronic Diastolic CHF - the patient has a history of CHF, with EF 65-70%, and grade 1 diastolic dysfunction, with mild dynamic LV obstruction (echo 05/25/12). The patient presently shows evidence of worsen LE edema, elevated JVP, and 16-lb weight gain.  -per cardiology rec's will now discontinue lasix drip and start demadex BID (40mg )  -monitor strict I/O's, daily weights  -monitor daily BMETs  -replete electrolytes as needed -continue coreg 25 mg BID  -patient is now breathing a lot better and 12L negative since admission.  # DM2 - the patient's A1C is 8.7 (09/2011). CBG's have been well-controlled during this hospitalization. -will continue Lantus and SSI   # Diabetic Neuropathy: continue gabapentin for neuropathy   # Restless Leg Syndrome - chronic, stable  -continue ropinirole   # LE erythema - the patient notes stable erythema for > 1 year, with no fever or leukocytosis. Unclear if this represents chronic venous stasis vs ?cellulitis.  -will continue PO doxycycline -LE dopplers neg for DVT or SVT  # Hematochezia - likely represents hemorrhoids  -Hgb stable and in not need for transfusion; will follow CBC in am. -will benefit of colonoscopy at discharge -no further bleeding at this time  # Intermittent Lethargy: CO2 in the 60's range on ABG on 5/22. Patient wear CPAP last night and is feeling more energetic and breathing more comfortable. Awake and alert X3 on exam.   #contraction alkalosis: -will discontinue lasix drip -start torsemide BID -diamox X1 to help with alkalosis  Code Status: Full Family Communication: no family at bedside Disposition Plan: to be determine. Continue telemetry   Consultants:  cardiology  Procedures:  See below for x-ray reports  Antibiotics:  vanc 5/21-5/23  Doxycycline  5/23  HPI/Subjective: No CP, no SOB, afebrile; reports breathing is improving. Wear CPAP last night and is feeling more energetic. Neg approx 12L  Objective: Filed Vitals:   07/14/12 0021 07/14/12 0213 07/14/12 0627 07/14/12 1334  BP:  162/67 127/40 146/47  Pulse: 68 64 56 58  Temp:  98.5 F (36.9 C) 98 F (36.7 C) 97.9 F (36.6 C)  TempSrc:  Oral Oral Oral  Resp: 20 22 20 20   Height:      Weight:   121.5 kg (267 lb 13.7 oz)   SpO2:  96% 96% 100%    Intake/Output Summary (Last 24 hours) at 07/14/12 1335 Last data filed at 07/14/12 1334  Gross per 24 hour  Intake    656 ml  Output   5725 ml  Net  -5069 ml   Filed Weights   07/12/12 0435 07/13/12 0428 07/14/12 0627  Weight: 127.1 kg (280 lb 3.3 oz) 126.5 kg (278 lb 14.1 oz) 121.5 kg (267 lb 13.7 oz)    Exam:   General:  NAD; AAOX3, more energetic  Cardiovascular: S1 and S2, no rubs or gallops; positive SEM  Respiratory: CTA   Abdomen: soft, NT, ND, positive BS  Musculoskeletal: 2+ edema bilaterally (significant improvement from yesterday exam); chronic stasis dermatitis changes; no drainage  Neuro: AAOX3; no focal deficit; no focal sensory deficit  Data Reviewed: Basic Metabolic Panel:  Recent Labs Lab 07/11/12 1443 07/12/12 0545 07/13/12 0856 07/14/12 0445  NA 139 143 144 144  K 3.9 3.4* 3.8 3.8  CL 99 100 98 98  CO2 24 29 37* 38*  GLUCOSE 187* 150* 206* 234*  BUN 24* 25* 20  22  CREATININE 0.70 0.80 0.85 0.80  CALCIUM 9.6 9.2 9.1 9.3   Liver Function Tests:  Recent Labs Lab 07/11/12 1443  AST 26  ALT 19  ALKPHOS 70  BILITOT 0.3  PROT 7.3  ALBUMIN 3.0*    Recent Labs Lab 07/11/12 2015  AMMONIA 37   CBC:  Recent Labs Lab 07/11/12 1443 07/11/12 2015 07/12/12 0545  WBC 8.8 8.3 8.1  HGB 10.7* 10.3* 9.7*  HCT 34.4* 32.9* 31.6*  MCV 76.8* 76.9* 77.6*  PLT 279 271 271   BNP (last 3 results)  Recent Labs  05/25/12 1021 07/11/12 1443  PROBNP 23.0 127.9*   CBG:  Recent  Labs Lab 07/13/12 1055 07/13/12 1637 07/13/12 2106 07/14/12 0636 07/14/12 1051  GLUCAP 209* 151* 217* 192* 230*    Studies: No results found.  Scheduled Meds: . aspirin  81 mg Oral Daily  . carvedilol  25 mg Oral BID WC  . doxycycline  100 mg Oral Q12H  . gabapentin  400 mg Oral BID  . heparin  5,000 Units Subcutaneous Q8H  . insulin aspart  0-20 Units Subcutaneous TID WC  . insulin aspart  0-5 Units Subcutaneous QHS  . insulin aspart  6 Units Subcutaneous TID WC  . insulin glargine  35 Units Subcutaneous QHS  . nystatin  5 mL Oral QID  . potassium chloride  20 mEq Oral BID  . rOPINIRole  2 mg Oral BID  . rOPINIRole  4 mg Oral QHS  . sodium chloride  3 mL Intravenous Q12H  . 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 If 7PM-7AM, please contact night-coverage at [email or web address], password [facility] 07/14/2012, 1:35 PM  LOS: 3 days

## 2012-07-14 NOTE — Progress Notes (Signed)
Patient ID: Melissa Myers, female   DOB: 04/17/1945, 67 y.o.   MRN: [id number] PCP: Dr. Felicity Coyer  Less Dyspnea:  Did where bipap last night.  No chest pain. Seems less lethargic  ROS: All systems reviewed and negative except as per HPI.   Current Facility-Administered Medications  Medication Dose Route Frequency Provider Last Rate Last Dose  . acetaminophen (TYLENOL) tablet 650 mg  650 mg Oral Q6H PRN Sorin Luanne Bras, MD       Or  . acetaminophen (TYLENOL) suppository 650 mg  650 mg Rectal Q6H PRN Sorin Luanne Bras, MD      . aspirin chewable tablet 81 mg  81 mg Oral Daily Sorin Luanne Bras, MD   81 mg at 07/13/12 1015  . carvedilol (COREG) tablet 25 mg  25 mg Oral BID WC Sorin Luanne Bras, MD   25 mg at 07/14/12 0656  . diphenhydrAMINE (BENADRYL) capsule 25-50 mg  25-50 mg Oral Q6H PRN Leanne Chang, NP   25 mg at 07/13/12 2206  . doxycycline (VIBRA-TABS) tablet 100 mg  100 mg Oral Q12H Vassie Loll, MD   100 mg at 07/13/12 2203  . furosemide (LASIX) 250 mg in dextrose 5 % 250 mL infusion  8 mg/hr Intravenous Continuous Vassie Loll, MD 8 mL/hr at 07/14/12 0200 8 mg/hr at 07/14/12 0200  . gabapentin (NEURONTIN) capsule 400 mg  400 mg Oral BID Vassie Loll, MD   400 mg at 07/13/12 2204  . heparin injection 5,000 Units  5,000 Units Subcutaneous Q8H Sorin Luanne Bras, MD   5,000 Units at 07/14/12 [phone]  . insulin aspart (novoLOG) injection 0-20 Units  0-20 Units Subcutaneous TID WC Sorin Luanne Bras, MD   4 Units at 07/14/12 [phone]  . insulin aspart (novoLOG) injection 0-5 Units  0-5 Units Subcutaneous QHS Sorin Luanne Bras, MD   2 Units at 07/13/12 2210  . insulin aspart (novoLOG) injection 6 Units  6 Units Subcutaneous TID WC Sorin Luanne Bras, MD   6 Units at 07/13/12 1715  . insulin glargine (LANTUS) injection 35 Units  35 Units Subcutaneous QHS Sorin Luanne Bras, MD   35 Units at 07/13/12 2209  . nystatin (MYCOSTATIN) 100000 UNIT/ML suspension 500,000 Units  5 mL Oral QID Sorin Luanne Bras, MD   500,000 Units at 07/13/12 2209   . potassium chloride (K-DUR,KLOR-CON) CR tablet 30 mEq  30 mEq Oral BID Linward Headland, MD   30 mEq at 07/13/12 2206  . rOPINIRole (REQUIP) tablet 2 mg  2 mg Oral BID Sorin Luanne Bras, MD   2 mg at 07/13/12 1715  . rOPINIRole (REQUIP) tablet 4 mg  4 mg Oral QHS Sorin Luanne Bras, MD   4 mg at 07/13/12 2204  . sodium chloride 0.9 % injection 3 mL  3 mL Intravenous Q12H Sorin C Laza, MD   3 mL at 07/13/12 2213    BP 127/40  Pulse 56  Temp(Src) 98 F (36.7 C) (Oral)  Resp 20  Ht 5\' 2"  (1.575 m)  Wt 267 lb 13.7 oz (121.5 kg)  BMI 48.98 kg/m2  SpO2 96% General: NAD, obese Neck: Thick, JVP 8 cm, no thyromegaly or thyroid nodule.  Lungs: Clear to auscultation bilaterally with normal respiratory effort. CV: Nondisplaced PMI.  Heart regular S1/S2, no S3/S4, 2/6 SEM RUSB.  2+ chronic edema to knees bilaterally (but less than prior).  No carotid bruit.  Abdomen: Soft, nontender, no hepatosplenomegaly, no distention.  Skin: Venous stasis changes lower legs bilaterally.  Neurologic: Alert and oriented x 3.  Psych: Normal affect. Extremities: No clubbing or cyanosis.   Plus 2 edema with erythema and chronic venous stasis  Assessment/Plan: Distolic CHF:  Will change to demedex 40 bid and stop drip She gets CO2 retension when overdiuresed and BNP only 128.  Will not be able to get Rid of all edema given venous insuficiency and morbid obesity.  Would ask pulmonary to see regarding ways to help compliance with CPAP and consider Diamox as diuretic  Will sign off  Charlton Haws 07/14/2012 9:29 AM

## 2012-07-15 LAB — BASIC METABOLIC PANEL
CO2: 32 mEq/L (ref 19–32)
Calcium: 9.6 mg/dL (ref 8.4–10.5)
Chloride: 101 mEq/L (ref 96–112)
Creatinine, Ser: 0.94 mg/dL (ref 0.50–1.10)
Glucose, Bld: 184 mg/dL — ABNORMAL HIGH (ref 70–99)
Sodium: 143 mEq/L (ref 135–145)

## 2012-07-15 LAB — GLUCOSE, CAPILLARY
Glucose-Capillary: 169 mg/dL — ABNORMAL HIGH (ref 70–99)
Glucose-Capillary: 133 mg/dL — ABNORMAL HIGH (ref 70–99)
Glucose-Capillary: 159 mg/dL — ABNORMAL HIGH (ref 70–99)
Glucose-Capillary: 139 mg/dL — ABNORMAL HIGH (ref 70–99)

## 2012-07-15 MED ORDER — HYDROCORTISONE 1 % EX CREA
TOPICAL_CREAM | Freq: Three times a day (TID) | CUTANEOUS | Status: DC
  Administered 2012-07-15 – 2012-07-16 (×3): via TOPICAL
  Filled 2012-07-15: qty 28

## 2012-07-15 MED ORDER — TORSEMIDE 20 MG PO TABS
60.0000 mg | ORAL_TABLET | Freq: Two times a day (BID) | ORAL | Status: DC
  Administered 2012-07-15 – 2012-07-16 (×2): 60 mg via ORAL
  Filled 2012-07-15 (×3): qty 3

## 2012-07-15 NOTE — Progress Notes (Signed)
[facility] PROGRESS NOTE  Melissa Myers WUJ:[id number] DOB: May 19, 1945 DOA: 07/11/2012 PCP: Rene Paci, MD  Assessment/Plan: # Acute on Chronic Diastolic CHF - the patient has a history of CHF, with EF 65-70%, and grade 1 diastolic dysfunction, with mild dynamic LV obstruction (echo 05/25/12). The patient presently shows evidence of worsen LE edema, elevated JVP, and 16-lb weight gain.  -per cardiology rec's will now discontinue lasix drip and start demadex BID; increase in leg swelling and decrease neg balance; will increase to 60mg  BID for better control. -monitor strict I/O's, daily weights  -monitor daily BMETs  -replete electrolytes as needed -continue coreg 25 mg BID  -patient is now breathing a lot better and 12L negative since admission.  # DM2 - the patient's A1C is 8.7 (09/2011). CBG's have been well-controlled during this hospitalization. -will continue Lantus and SSI   # Diabetic Neuropathy: continue gabapentin for neuropathy   # Restless Leg Syndrome - chronic, stable  -continue ropinirole   # LE erythema - the patient notes stable erythema for > 1 year, with no fever or leukocytosis. Unclear if this represents chronic venous stasis vs ?cellulitis.  -will continue PO doxycycline -LE dopplers neg for DVT or SVT  # Hematochezia - likely represents hemorrhoids  -Hgb stable and in not need for transfusion; will follow CBC in am. -will benefit of colonoscopy at discharge -no further bleeding at this time  # Intermittent Lethargy: CO2 in the 60's range on ABG on 5/22. Patient wear CPAP last night and is feeling more energetic and breathing more comfortable. Awake and alert X3 on exam.   #contraction alkalosis: -will discontinue lasix drip -start torsemide BID -CO2 32  Code Status: Full Family Communication: no family at bedside Disposition Plan: to be determine. Continue telemetry   Consultants:  cardiology  Procedures:  See below for x-ray  reports  Antibiotics:  vanc 5/21-5/23  Doxycycline 5/23--5/30  HPI/Subjective: No CP, no SOB, afebrile; reports breathing is stable. Wear CPAP overnight. Legs with slight swelling in comparison to yesterday.  Objective: Filed Vitals:   07/14/12 2147 07/15/12 0023 07/15/12 0445 07/15/12 1521  BP: 148/53  132/47 130/56  Pulse: 63 68 62 57  Temp: 98 F (36.7 C)  97.8 F (36.6 C) 98.3 F (36.8 C)  TempSrc: Oral  Oral Oral  Resp: 20 20 18 18   Height:      Weight:   120.4 kg (265 lb 6.9 oz)   SpO2: 99%  94% 93%    Intake/Output Summary (Last 24 hours) at 07/15/12 1902 Last data filed at 07/15/12 1755  Gross per 24 hour  Intake    860 ml  Output   2900 ml  Net  -2040 ml   Filed Weights   07/13/12 0428 07/14/12 0627 07/15/12 0445  Weight: 126.5 kg (278 lb 14.1 oz) 121.5 kg (267 lb 13.7 oz) 120.4 kg (265 lb 6.9 oz)    Exam:   General:  NAD; AAOX3, more energetic  Cardiovascular: S1 and S2, no rubs or gallops; positive SEM  Respiratory: CTA   Abdomen: soft, NT, ND, positive BS  Musculoskeletal: 2+ edema bilaterally (significant improvement from yesterday exam); chronic stasis dermatitis changes; no drainage  Neuro: AAOX3; no focal deficit; no focal sensory deficit  Data Reviewed: Basic Metabolic Panel:  Recent Labs Lab 07/11/12 1443 07/12/12 0545 07/13/12 0856 07/14/12 0445 07/15/12 0410  NA 139 143 144 144 143  K 3.9 3.4* 3.8 3.8 3.9  CL 99 100 98 98 101  CO2 24  29 37* 38* 32  GLUCOSE 187* 150* 206* 234* 184*  BUN 24* 25* 20 22 24*  CREATININE 0.70 0.80 0.85 0.80 0.94  CALCIUM 9.6 9.2 9.1 9.3 9.6   Liver Function Tests:  Recent Labs Lab 07/11/12 1443  AST 26  ALT 19  ALKPHOS 70  BILITOT 0.3  PROT 7.3  ALBUMIN 3.0*    Recent Labs Lab 07/11/12 2015  AMMONIA 37   CBC:  Recent Labs Lab 07/11/12 1443 07/11/12 2015 07/12/12 0545  WBC 8.8 8.3 8.1  HGB 10.7* 10.3* 9.7*  HCT 34.4* 32.9* 31.6*  MCV 76.8* 76.9* 77.6*  PLT 279 271 271    BNP (last 3 results)  Recent Labs  05/25/12 1021 07/11/12 1443  PROBNP 23.0 127.9*   CBG:  Recent Labs Lab 07/14/12 1603 07/14/12 2144 07/15/12 0615 07/15/12 1101 07/15/12 1608  GLUCAP 178* 191* 169* 133* 159*    Studies: No results found.  Scheduled Meds: . aspirin  81 mg Oral Daily  . carvedilol  25 mg Oral BID WC  . doxycycline  100 mg Oral Q12H  . gabapentin  400 mg Oral BID  . heparin  5,000 Units Subcutaneous Q8H  . hydrocortisone cream   Topical TID  . insulin aspart  0-20 Units Subcutaneous TID WC  . insulin aspart  0-5 Units Subcutaneous QHS  . insulin aspart  6 Units Subcutaneous TID WC  . insulin glargine  35 Units Subcutaneous QHS  . nystatin  5 mL Oral QID  . potassium chloride  20 mEq Oral BID  . rOPINIRole  2 mg Oral BID  . rOPINIRole  4 mg Oral QHS  . sodium chloride  3 mL Intravenous Q12H  . 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 If 7PM-7AM, please contact night-coverage at [email or web address], password [facility] 07/15/2012, 7:02 PM  LOS: 4 days

## 2012-07-15 NOTE — Evaluation (Signed)
Physical Therapy Evaluation Patient Details Name: Melissa Myers MRN: [id number] DOB: 03-27-1945 Today's Date: 07/15/2012 Time: 0102-7253 PT Time Calculation (min): 34 min  PT Assessment / Plan / Recommendation Clinical Impression  Patient is a 67 yo female admitted with CHF, with bil. LE pain.  Will benefit from acute PT to maximize independence prior to return home with family.  Recommend HHPT.    PT Assessment  Patient needs continued PT services    Follow Up Recommendations  Home health PT;Supervision/Assistance - 24 hour    Does the patient have the potential to tolerate intense rehabilitation      Barriers to Discharge None      Equipment Recommendations  Rolling walker with 5" wheels    Recommendations for Other Services     Frequency Min 3X/week    Precautions / Restrictions Precautions Precautions: Fall Restrictions Weight Bearing Restrictions: No   Pertinent Vitals/Pain       Mobility  Bed Mobility Bed Mobility: Not assessed Transfers Transfers: Sit to Stand;Stand to Sit Sit to Stand: 4: Min assist;With upper extremity assist;With armrests;From chair/3-in-1 Stand to Sit: 4: Min guard;With upper extremity assist;With armrests;To chair/3-in-1 Details for Transfer Assistance: Verbal cues for hand placement and to scoot to edge of chair before standing.  Moved sit to stand x2 for strengthening and practice.   Ambulation/Gait Ambulation/Gait Assistance: 4: Min guard Ambulation Distance (Feet): 110 Feet Assistive device: Rolling walker Ambulation/Gait Assistance Details: Verbal cues to stay close to RW for safety.  Cues to stand upright. Gait Pattern: Step-through pattern;Decreased step length - right;Decreased step length - left;Trunk flexed;Lateral trunk lean to right;Lateral trunk lean to left Gait velocity: Slow gait speed    Exercises General Exercises - Lower Extremity Ankle Circles/Pumps: AROM;Left;10 reps;Seated (RLE too painful)   PT  Diagnosis: Difficulty walking;Abnormality of gait;Acute pain  PT Problem List: Decreased strength;Decreased activity tolerance;Decreased balance;Decreased mobility;Cardiopulmonary status limiting activity;Obesity;Pain PT Treatment Interventions: DME instruction;Gait training;Functional mobility training;Patient/family education   PT Goals Acute Rehab PT Goals PT Goal Formulation: With patient Time For Goal Achievement: 07/22/12 Potential to Achieve Goals: Good Pt will go Supine/Side to Sit: with supervision;with HOB 0 degrees PT Goal: Supine/Side to Sit - Progress: Goal set today Pt will go Sit to Supine/Side: with supervision;with HOB 0 degrees PT Goal: Sit to Supine/Side - Progress: Goal set today Pt will go Sit to Stand: with supervision;with upper extremity assist PT Goal: Sit to Stand - Progress: Goal set today Pt will Ambulate: 51 - 150 feet;with supervision;with rolling walker PT Goal: Ambulate - Progress: Goal set today  Visit Information  Last PT Received On: 07/15/12 Assistance Needed: +1    Subjective Data  Subjective: "I need a RW that will fit through my doors. Patient Stated Goal: To go home soon.  For my legs to quit hurting   Prior Functioning  Home Living Lives With: Spouse (Mother) Available Help at Discharge: Family;Available 24 hours/day Type of Home: House Home Access: Stairs to enter [employer] of Steps: 1 Entrance Stairs-Rails: None Home Layout: One level Bathroom Shower/Tub: Other (comment) (Takes sponge baths) Bathroom Toilet: Handicapped height Bathroom Accessibility: Yes How Accessible: Accessible via walker Home Adaptive Equipment: Walker - rolling;Shower chair with back;Bedside commode/3-in-1 Prior Function Level of Independence: Independent with assistive device(s);Needs assistance Needs Assistance: Light Housekeeping Light Housekeeping: Moderate Able to Take Stairs?: Yes Driving: No Vocation: Retired Comments: Prepares meals  every day Communication Communication: No difficulties    Cognition  Cognition Arousal/Alertness: Awake/alert Behavior During Therapy: WFL for tasks  assessed/performed Overall Cognitive Status: Within Functional Limits for tasks assessed    Extremity/Trunk Assessment Right Upper Extremity Assessment RUE ROM/Strength/Tone: WFL for tasks assessed RUE Sensation: WFL - Light Touch Left Upper Extremity Assessment LUE ROM/Strength/Tone: WFL for tasks assessed LUE Sensation: WFL - Light Touch Right Lower Extremity Assessment RLE ROM/Strength/Tone: Deficits RLE ROM/Strength/Tone Deficits: Strength 4/5; Edema; Redness lower leg and foot RLE Sensation: History of peripheral neuropathy Left Lower Extremity Assessment LLE ROM/Strength/Tone: Deficits LLE ROM/Strength/Tone Deficits: Strength 4/5; Edema; Redness lower leg and foot LLE Sensation: History of peripheral neuropathy   Balance    End of Session PT - End of Session Equipment Utilized During Treatment: Gait belt Activity Tolerance: Patient limited by fatigue;Patient limited by pain Patient left: in chair;with call bell/phone within reach;with family/visitor present Nurse Communication: Mobility status  GP     Vena Austria 07/15/2012, 6:46 PM Durenda Hurt. Renaldo Fiddler, [facility] Pager [phone]

## 2012-07-16 LAB — BASIC METABOLIC PANEL
BUN: 30 mg/dL — ABNORMAL HIGH (ref 6–23)
Chloride: 98 mEq/L (ref 96–112)
Creatinine, Ser: 1 mg/dL (ref 0.50–1.10)
GFR calc Af Amer: 66 mL/min — ABNORMAL LOW (ref >90)
GFR calc non Af Amer: 57 mL/min — ABNORMAL LOW (ref >90)

## 2012-07-16 LAB — GLUCOSE, CAPILLARY

## 2012-07-16 MED ORDER — CARVEDILOL 25 MG PO TABS: 25.0000 mg | ORAL_TABLET | Freq: Two times a day (BID) | ORAL | Status: DC

## 2012-07-16 MED ORDER — TORSEMIDE 20 MG PO TABS: 60.0000 mg | ORAL_TABLET | Freq: Two times a day (BID) | ORAL | Status: DC

## 2012-07-16 MED ORDER — DOXYCYCLINE HYCLATE 100 MG PO TABS: 100.0000 mg | ORAL_TABLET | Freq: Two times a day (BID) | ORAL | Status: DC

## 2012-07-16 NOTE — Progress Notes (Signed)
SATURATION QUALIFICATIONS: (This note is used to comply with regulatory documentation for home oxygen)  Patient Saturations on Room Air at Rest = 83%   

## 2012-07-16 NOTE — Care Management Note (Signed)
    CARE MANAGEMENT NOTE 07/16/2012  Patient:  Melissa Myers, Melissa Myers   Account Number:  [id number]  Date Initiated:  07/16/2012  Documentation initiated by:  Ethleen Lormand  Subjective/Objective Assessment:   Orders for home CPAP, oxygen and HH     Action/Plan:   Pt active with [facility], call placed to notify [facility] of plan to d/c and need for ongoing HH. Call also place to [facility] dme rep re CPAP and oxygen needs.   Anticipated DC Date:  07/16/2012   Anticipated DC Plan:  HOME W HOME HEALTH SERVICES         [facility] Choice  DURABLE MEDICAL EQUIPMENT  HOME HEALTH   Choice offered to / List presented to:     DME arranged  CPAP  TUB BENCH  OXYGEN      DME agency  [facility]     HH arranged  HH-1 RN  HH-2 PT      [facility] agency  [facility]   Status of service:  Completed, signed off Medicare Important Message given?   (If response is "NO", the following Medicare IM given date fields will be blank) Date Medicare IM given:   Date Additional Medicare IM given:    Discharge Disposition:  HOME W HOME HEALTH SERVICES  Per UR Regulation:    If discussed at Long Length of Stay Meetings, dates discussed:    Comments:

## 2012-07-16 NOTE — Progress Notes (Signed)
Physical Therapy Treatment Patient Details Name: Melissa Myers MRN: [id number] DOB: 07-25-1945 Today's Date: 07/16/2012 Time: 4696-2952 PT Time Calculation (min): 20 min  PT Assessment / Plan / Recommendation Comments on Treatment Session  Pt agreeable to participate in therapy.   Pt's 02 sats dropped to 83% RA while sitting EOB but returned >90% within 30 secs + pursed lip breathing.  Trialed ambulation without supplemental 02 & sats dropped to 83%.  Pt placed on 2L for remainder of ambulation with sats remaining between 90-93%.      Follow Up Recommendations  Home health PT;Supervision/Assistance - 24 hour     Does the patient have the potential to tolerate intense rehabilitation     Barriers to Discharge        Equipment Recommendations  Rolling walker with 5" wheels    Recommendations for Other Services    Frequency Min 3X/week   Plan      Precautions / Restrictions Precautions Precautions: Fall Restrictions Weight Bearing Restrictions: No       Mobility  Bed Mobility Bed Mobility: Not assessed Transfers Transfers: Sit to Stand;Stand to Sit Sit to Stand: 4: Min guard;With upper extremity assist;From bed;From chair/3-in-1;With armrests Stand to Sit: 4: Min guard;With upper extremity assist;With armrests;To chair/3-in-1 Details for Transfer Assistance: cues for hand placement & safe body positioning before sitting Ambulation/Gait Ambulation/Gait Assistance: 4: Min guard Ambulation Distance (Feet): 80 Feet Assistive device: Rolling walker Ambulation/Gait Assistance Details: Cues for tall posture, stay close to RW, & pursed lip breathing.   Gait Pattern: Step-through pattern;Decreased stride length;Decreased hip/knee flexion - right;Decreased hip/knee flexion - left General Gait Details: 02 sats dropping to 83-86% RA.  Placed on 2L with 02 sats remaining ~91%.   Stairs: No Wheelchair Mobility Wheelchair Mobility: No      PT Goals Acute Rehab PT Goals Time  For Goal Achievement: 07/22/12 Potential to Achieve Goals: Good Pt will go Supine/Side to Sit: with supervision;with HOB 0 degrees Pt will go Sit to Supine/Side: with supervision;with HOB 0 degrees Pt will go Sit to Stand: with supervision;with upper extremity assist PT Goal: Sit to Stand - Progress: Progressing toward goal Pt will Ambulate: 51 - 150 feet;with supervision;with rolling walker PT Goal: Ambulate - Progress: Progressing toward goal  Visit Information  Last PT Received On: 07/16/12 Assistance Needed: +1    Subjective Data      Cognition  Cognition Arousal/Alertness: Awake/alert Behavior During Therapy: WFL for tasks assessed/performed Overall Cognitive Status: Within Functional Limits for tasks assessed    Balance     End of Session PT - End of Session Equipment Utilized During Treatment: Gait belt Activity Tolerance: Patient tolerated treatment well Patient left: in chair;with call bell/phone within reach Nurse Communication: Mobility status     Verdell Face, [place] [phone] 07/16/2012

## 2012-07-16 NOTE — Discharge Summary (Signed)
Physician Discharge Summary  Melissa Myers RJJ:[id number] DOB: [birth year] DOA: 07/11/2012  PCP: Rene Paci, MD  Admit date: 07/11/2012 Discharge date: 07/16/2012  Time spent: >30 minutes  Recommendations for Outpatient Follow-up:  -BMET to follow electrolytes and renal function -Sleep study to be arranged as an outpatient for specific water pressure on her CPAP -reassess BP and adjust medications as needed -close follow up on breathing status.  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 Discharge home with [facility] services and family care.   Diet recommendation: low carb and low sodium diet. Also with fluid restriction of 1.8 -2L daily  Filed Weights   07/14/12 0627 07/15/12 0445 07/16/12 0558  Weight: 121.5 kg (267 lb 13.7 oz) 120.4 kg (265 lb 6.9 oz) 118.9 kg (262 lb 2 oz)    History of present illness:  67 y.o. female with past medical history of chronic diastolic congestive heart failure, morbid obesity, diabetes, who was referred to the emergency room today by her home health care nurse to 2 worsening dyspnea, hypoxia and significant weight gain for the past month. According to the daughter the patient may have gained about 20 pounds in the last month. She is already on very high doses of torsemide the patient reports that her urinary output seems to be decreasing. Patient denies any chest pain she denies any fevers or chills. She also reports new onset hematochezia today and worsening pain in her legs and new  wounds on both lower extremities.   Hospital Course:  # Acute on Chronic Diastolic CHF - the patient has a history of CHF, with EF 65-70%, and grade 1 diastolic dysfunction, with mild dynamic LV obstruction (echo 05/25/12). The patient presently shows evidence of worsen LE edema, elevated JVP, and 16-lb weight gain.  -per cardiology rec's will now discontinue lasix drip and start demadex BID; increase in leg swelling and decrease neg balance; will increase to 60mg  BID for better control.  -monitor strict I/O's, daily weights and low sodium diet -continue coreg 25 mg BID  -patient is now breathing a lot better and approx 16L negative since admission.   # DM2 on insulin therapy - the patient's A1C is 8.7 (09/2011). CBG's have been well-controlled during this hospitalization.  -will continue Lantus and SSI  -follow up with PCP  # Diabetic Neuropathy: continue gabapentin for neuropathy   # Restless Leg Syndrome - chronic, stable  -continue ropinirole   # LE erythema -the patient notes stable erythema for > 1 year, with no fever or leukocytosis. Unclear if this represents chronic venous stasis dermatitis vs cellulitis.  -will finish treatment with PO doxycycline; most likely superimposed cellulitis on top of her chronic dermatitis. -LE dopplers neg for DVT or SVT -at discharge swelling and erythema greatly improved.   # Hematochezia - likely represents hemorrhoids  -Hgb stable and in not need for transfusion; will recommend follow up of CBC in outpatient setting and colonoscopy   # Intermittent Lethargy due to OSA: CO2 in the 60's range on ABG on 5/22. Patient wear  CPAP for 3 nights straight and her symptoms significantly improved. -At discharge feeling energetic and breathing more comfortable.  -Awake and alert X3 on exam.  -CPAP with auto titration until sleep study repeated  #contraction alkalosis:  -due to aggressive diuresis and chronic retention from OSA/OHS -home on torsemide BID   -encourage to use CPAP   Procedures:  See below for x-ray reports  Consultations:  Cardiology  PT/OT  RT  Discharge Exam: Filed Vitals:   07/15/12 2052 07/16/12 0035 07/16/12 0558 07/16/12 1002  BP: 137/47  136/37 138/72  Pulse: 54 62 65 57  Temp: 97.3 F (36.3 C)  98.3 F (36.8 C)   TempSrc: Oral  Oral   Resp: 20 20 18    Height:      Weight:   118.9 kg (262 lb 2 oz)   SpO2: 96%  90%     General: AAOX3, no acute complaints; afebrile; reports breathing very comfortable Cardiovascular: S1 and S2, no rubs or gallops, positive SEM Respiratory: no wheezing, no crackles, good air movement Abdomen: obese, soft, NT, ND, positive BS Neuro: non focal deficit Extremities: with stasis dermatitis bilaterally and superimposed cellulitis; improved erythema and swelling   Discharge Instructions  Discharge Orders   Future Appointments Provider Department Dept Phone   07/19/2012 9:45 AM Laurey Morale, MD [facility] [place]) [phone]   09/03/2012 1:15 PM Newt Lukes, MD [facility] [phone]   Future Orders Complete By Expires     Diet - low sodium heart healthy  As directed     Discharge instructions  As directed     Comments:      Follow up with PCP in 2 weeks Take meds as prescribed Compliance with CPAP every night Follow a low sodium diet (mx 2 grams daily) and keep fluid intake to a max of 1.8L in 24 hours        Medication List    STOP taking these medications       cephALEXin 500 MG capsule  Commonly known as:  KEFLEX      TAKE these medications       aspirin 81 MG tablet  Take 81 mg by mouth daily.     carvedilol 25 MG tablet  Commonly known as:  COREG  Take 1 tablet (25 mg total) by mouth 2 (two) times daily with a meal. 1 and 1/2 tablets (total 37.5mg ) two times a day     CoQ10 30 MG Caps  Take 1 capsule by mouth daily.     doxycycline 100 MG tablet  Commonly known as:  VIBRA-TABS  Take 1  tablet (100 mg total) by mouth every 12 (twelve) hours.     fenofibrate 160 MG tablet  Take 1 tablet (160 mg total) by mouth daily.     fluticasone 0.005 % ointment  Commonly known as:  CUTIVATE  Apply topically 3 (three) times daily as needed. For itching     gabapentin 400 MG capsule  Commonly known as:  NEURONTIN  Take 400 mg by mouth 2 (two) times daily.     hydrALAZINE 50 MG tablet  Commonly known as:  APRESOLINE  Take 1 tablet (50 mg total) by mouth 3 (three) times daily.     insulin glargine 100 UNIT/ML injection  Commonly known as:  LANTUS  Inject 60 Units into the skin at bedtime.     insulin glulisine 100 UNIT/ML injection  Commonly known as:  APIDRA  Inject 27-29 Units  into the skin 3 (three) times daily before meals. Takes 29 units in the am, 28 units midday & 27 units at night     Magnesium 200 MG Tabs  Take 1 tablet by mouth 2 (two) times daily. 1 tablet two times a day     metFORMIN 500 MG tablet  Commonly known as:  GLUCOPHAGE  Take 500-1,000 mg by mouth 3 (three) times daily between meals as needed. For elevated blood sugar     nystatin powder  Commonly known as:  MYCOSTATIN  Apply topically 4 (four) times daily as needed. For topical infections     omega-3 acid ethyl esters 1 G capsule  Commonly known as:  LOVAZA  Take 2 g by mouth 2 (two) times daily.     ONE TOUCH ULTRA TEST test strip  Generic drug:  glucose blood  USE TO CHECK BLOOD GLUCOSE TWICE DAILY     oxyCODONE-acetaminophen 5-325 MG per tablet  Commonly known as:  PERCOCET/ROXICET  Take 1 tablet by mouth every 6 (six) hours as needed. for pain     potassium chloride 10 MEQ tablet  Commonly known as:  K-DUR  Take 30 mEq by mouth 2 (two) times daily. 3 tablets (total 30 mEq) two times a day     rOPINIRole 2 MG tablet  Commonly known as:  REQUIP  Take 2 mg by mouth 2 (two) times daily.     torsemide 20 MG tablet  Commonly known as:  DEMADEX  Take 3 tablets (60 mg total) by mouth 2  (two) times daily. Take 4 tablets (total 80mg ) daily at the same time in the morning       Allergies  Allergen Reactions  . Morphine     REACTION: GI upset and headaches  . Sulfa Antibiotics Diarrhea  . Sulfonamide Derivatives   . Zinc Swelling and Rash       Follow-up Information   Follow up with Rene Paci, MD. Schedule an appointment as soon as possible for a visit in 10 days.   Contact information:   [address] [place] [address] [phone]        The results of significant diagnostics from this hospitalization (including imaging, microbiology, ancillary and laboratory) are listed below for reference.    Significant Diagnostic Studies: Dg Chest 2 View  07/11/2012   *RADIOLOGY REPORT*  Clinical Data: Short of breath.  Fluid retention.  History of diastolic heart failure.  CHEST - 2 VIEW  Comparison: 12/31/2010  Findings: Mild cardiomegaly persists.  There is pulmonary venous hypertension without interstitial or alveolar edema.  No pleural fluid.  No focal parenchymal finding.  Ordinary degenerative changes effect the spine.  IMPRESSION: Chronic mild cardiomegaly.  Probable pulmonary venous hypertension. No frank edema.   Original Report Authenticated By: Paulina Fusi, M.D.   US Abdomen Complete  07/12/2012   *RADIOLOGY REPORT*  Clinical Data:  Obesity, renal insufficiency, diabetes, evaluate for ascites and cirrhosis  COMPLETE ABDOMINAL ULTRASOUND  Comparison:  CT abdomen of 01/13/2008  Findings:  Gallbladder:  The gallbladder has previously been resected.  Common bile duct:  The common bile duct is normal measuring 6.0 mm in diameter.  Liver:  The echogenicity of the liver parenchyma has increased consistent with fatty infiltration of possible cirrhosis.  The contours of the liver are slightly nodular also suggesting cirrhosis.  No ductal dilatation is seen.  IVC:  Appears normal.  Pancreas:  The tail of the pancreas is  obscured by bowel  gas.  Spleen:  The spleen is normal measuring 7.3 cm sagittally.  Right Kidney:  No hydronephrosis is seen.  The right kidney measures 13.5 cm sagittally.  Left Kidney:  No hydronephrosis is noted.  The left kidney measures 12.7 cm.  Abdominal aorta:  The abdominal aorta is normal in caliber although obscured in the mid distal aspect by bowel gas.  This study is somewhat compromised by patient body habitus and bowel gas.  No ascites is evident.  IMPRESSION:  1.  Changes of fatty infiltration of the liver and probable cirrhosis with somewhat irregular margins of the liver.  No ductal dilatation. 2.  The spleen is normal in size. 3.  The tail of the pancreas is obscured by bowel gas. 4.  No ascites.  Some of the anatomy is obscured by body habitus and bowel gas.   Original Report Authenticated By: Dwyane Dee, M.D.   Labs: Basic Metabolic Panel:  Recent Labs Lab 07/12/12 0545 07/13/12 0856 07/14/12 0445 07/15/12 0410 07/16/12 0445  NA 143 144 144 143 141  K 3.4* 3.8 3.8 3.9 4.1  CL 100 98 98 101 98  CO2 29 37* 38* 32 35*  GLUCOSE 150* 206* 234* 184* 165*  BUN 25* 20 22 24* 30*  CREATININE 0.80 0.85 0.80 0.94 1.00  CALCIUM 9.2 9.1 9.3 9.6 9.5   Liver Function Tests:  Recent Labs Lab 07/11/12 1443  AST 26  ALT 19  ALKPHOS 70  BILITOT 0.3  PROT 7.3  ALBUMIN 3.0*    Recent Labs Lab 07/11/12 2015  AMMONIA 37   CBC:  Recent Labs Lab 07/11/12 1443 07/11/12 2015 07/12/12 0545  WBC 8.8 8.3 8.1  HGB 10.7* 10.3* 9.7*  HCT 34.4* 32.9* 31.6*  MCV 76.8* 76.9* 77.6*  PLT 279 271 271   BNP: BNP (last 3 results)  Recent Labs  05/25/12 1021 07/11/12 1443  PROBNP 23.0 127.9*   CBG:  Recent Labs Lab 07/15/12 0615 07/15/12 1101 07/15/12 1608 07/15/12 2034 07/16/12 0557  GLUCAP 169* 133* 159* 139* 156*     Signed:  Alayshia Marini  [facility] 07/16/2012, 11:44 AM

## 2012-07-16 NOTE — Progress Notes (Signed)
DC IV, DC Tele, DC Home. Discharge instructions and home medications discussed with patient. Patient denied any questions or concerns at this time. Patient leaving unit via wheelchair and appears in no acute distress.  

## 2012-07-16 NOTE — Clinical Documentation Improvement (Signed)
DIABETIC  DOCUMENTATION CLARIFICATION QUERY   CLINICAL DOCUMENTATION QUERIES ARE NOT PART OF THE PERMANENT MEDICAL RECORD   Please update your documentation within the medical record to reflect your response to this query.                                                                                          07/16/12   Dr. 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   Thank You!    In responding to this query please exercise your independent judgment.     The fact that a query is asked, does not imply that any particular answer is desired or expected.   Reviewed: Type 2 with longterm use of insulin; will be added to discharge summary    Jerral Ralph  RN BSN CCDS Certified Clinical Documentation Specialist: Cell   [phone]  Health Information Management [place]    TO RESPOND TO THE THIS QUERY, FOLLOW THE INSTRUCTIONS BELOW:  1. If needed, update documentation for the patient's encounter via the notes activity.  2. Access this query again and click edit on the In [employer].  3. After updating, or not, click F2  to complete all highlighted (required) fields concerning your review. Select "additional documentation in the medical record" OR "no additional documentation provided".  4. Click Sign note button.  5. The deficiency will fall out of your In Basket *Please let us know if you are not able to complete this workflow by phone or e-mail (listed below).

## 2012-07-17 ENCOUNTER — Telehealth: Payer: Self-pay | Admitting: General Practice

## 2012-07-17 DIAGNOSIS — L97909 Non-pressure chronic ulcer of unspecified part of unspecified lower leg with unspecified severity: Secondary | ICD-10-CM | POA: Diagnosis not present

## 2012-07-17 DIAGNOSIS — E109 Type 1 diabetes mellitus without complications: Secondary | ICD-10-CM | POA: Diagnosis not present

## 2012-07-17 DIAGNOSIS — I5031 Acute diastolic (congestive) heart failure: Secondary | ICD-10-CM | POA: Diagnosis not present

## 2012-07-17 DIAGNOSIS — I10 Essential (primary) hypertension: Secondary | ICD-10-CM | POA: Diagnosis not present

## 2012-07-17 DIAGNOSIS — I872 Venous insufficiency (chronic) (peripheral): Secondary | ICD-10-CM | POA: Diagnosis not present

## 2012-07-17 DIAGNOSIS — I509 Heart failure, unspecified: Secondary | ICD-10-CM | POA: Diagnosis not present

## 2012-07-17 NOTE — Telephone Encounter (Signed)
Transitional care call - Discharged from hospital on 07/16/2012.  DC diagnosis Acute Respiratory failure.  Patient says that she is doing well but is very weak.  Patient is using continuous O2 @ 2L.  [facility] came to the home today and set up oxygen for patient.  RN from [facility] will be coming out to see patient 2 or 3 times per week for several weeks.  Patient uses CPAP at night.  Patient lives with her husband and mother so she has plenty of help in the home.  This RN advised patient to weigh herself every day and record and report to MD any significant increase in weight ie 2 pounds over night or 3 - 4 pounds over 3 or 4 days or shortness of breath.  Patient states that she checks blood glucose several times per day and is compliant with diabetic medications.  RN reviewed medications with patient and patient denied any questions regarding medications.  Patient has follow up appointment with Dr. Felicity Coyer on 07/26/2012 @ 10:45am.  Patient denied any questions for MD at the present time.

## 2012-07-18 DIAGNOSIS — I5031 Acute diastolic (congestive) heart failure: Secondary | ICD-10-CM | POA: Diagnosis not present

## 2012-07-18 DIAGNOSIS — I10 Essential (primary) hypertension: Secondary | ICD-10-CM | POA: Diagnosis not present

## 2012-07-18 DIAGNOSIS — I509 Heart failure, unspecified: Secondary | ICD-10-CM | POA: Diagnosis not present

## 2012-07-18 DIAGNOSIS — I872 Venous insufficiency (chronic) (peripheral): Secondary | ICD-10-CM | POA: Diagnosis not present

## 2012-07-18 DIAGNOSIS — E109 Type 1 diabetes mellitus without complications: Secondary | ICD-10-CM | POA: Diagnosis not present

## 2012-07-18 DIAGNOSIS — L97909 Non-pressure chronic ulcer of unspecified part of unspecified lower leg with unspecified severity: Secondary | ICD-10-CM | POA: Diagnosis not present

## 2012-07-19 ENCOUNTER — Ambulatory Visit (INDEPENDENT_AMBULATORY_CARE_PROVIDER_SITE_OTHER): Payer: Medicare Other | Admitting: Cardiology

## 2012-07-19 ENCOUNTER — Encounter: Payer: Self-pay | Admitting: Cardiology

## 2012-07-19 VITALS — BP 128/52 | HR 74 | Ht 62.0 in | Wt 266.0 lb

## 2012-07-19 DIAGNOSIS — I5022 Chronic systolic (congestive) heart failure: Secondary | ICD-10-CM

## 2012-07-19 DIAGNOSIS — E785 Hyperlipidemia, unspecified: Secondary | ICD-10-CM | POA: Diagnosis not present

## 2012-07-19 DIAGNOSIS — G4730 Sleep apnea, unspecified: Secondary | ICD-10-CM

## 2012-07-19 DIAGNOSIS — R0602 Shortness of breath: Secondary | ICD-10-CM

## 2012-07-19 DIAGNOSIS — I5032 Chronic diastolic (congestive) heart failure: Secondary | ICD-10-CM | POA: Diagnosis not present

## 2012-07-19 DIAGNOSIS — N259 Disorder resulting from impaired renal tubular function, unspecified: Secondary | ICD-10-CM

## 2012-07-19 MED ORDER — TORSEMIDE 20 MG PO TABS: ORAL_TABLET | ORAL | Status: DC

## 2012-07-19 NOTE — Progress Notes (Signed)
Patient ID: Melissa Myers, female   DOB: 1945-06-20, 67 y.o.   MRN: [id number] PCP: Dr. Felicity Coyer  67 yo with history of HTN, DM, hyperlipidemia, obesity, and chronic dyspnea/diastolic CHF returns for cardiac evaluation. Patient had an echo in 3/11 showing moderate LVH and preserved LV systolic function. However, there was a very large LV mid-cavity gradient with valsalva.  Patient developed quite significant exertional dyspnea to the point where she was short of breath walking around her house. She had a pulmonary evaluation with Dr. Vassie Loll but no primary lung problems were identified. Patient had an ETT-myoview in 4/11 which was negative for ischemia or infarction. Right heart cath in 4/11 showed mildly elevated right heart filling pressures but normal PA pressure and normal PCWP. I started her on a beta blocker (Coreg) to try to lower her LV mid-cavity gradient (that likely occurs with exertion) and to better control BP.  V/Q scan was negative for PE.  PFTs from 5/13 showed a restrictive defect. She had a recent repeat echo in 4/14 with normal EF, mild mid-cavity LV gradient.     Since last appointment, she was admitted with hypercarbic respiratory failure and acute on chronic diastolic CHF.  She had gained 20 lbs despite compliance with her torsemide (and the torsemide seemed to be working and causing weight loss last time I saw her).  She was diuresed in the hospital and started on home oxygen during the day and CPAP at night given OHS/OSA.  She was supposed to take torsemide 60 mg bid at discharge but went back to torsemide 80 mg daily.  She feels like she is doing better with oxygen and CPAP.  She walks slowly but is not short of breath walking in the house.  No chest pain.  Her weight is stable compared to prior appointment.   Labs (4/11): TGs 539, HDL 58, LDL 128, K 5.2, creatinine 1.3, TSH normal, BNP 14  Labs (5/11): K 3.9, creatinine 1.0  Labs (9/11): creatinine 1.1, BUN 43, LDL 145, HDL 60   Labs (10/11): K 4.3, creatinine 0.81, HCT 34, LDL 115, HDL 75  Labs (11/12): K 4.2, creatinine 1.58 => 3.0, HCT 32.2, BNP 172, LDL 97, TGs 980 Labs (12/12): K 3.4, creatinine 1.2 => 1.7 Labs (1/13): K 3.9, creatinine 1.1, TGs 281, LDL 121 Labs (2/13): K 3.3, creatinine 1.2 Labs (10/13): K 4.1, creatinine 0.85 Labs (4/14): K 3.7, creatinine 1.2, BUN 43, BNP 23 Labs (5/14): K 4.1, creatinine 1.0  Allergies (verified):  1) ! Sulfa  2) Morphine   Past Medical History:  1. Diabetes mellitus, type II  2. Hyperlipidemia: She has been unable to tolerate statins (has been on Vytorin, lovastatin, and Crestor) due to what sounds like rhabdo: developed muscle weakness and was told she had "muscle damage" with each of these statins. She has been told not to take statins anymore.  3. Hypertension: prev was told she had "white coat" HTN  4. Obesity  5. Restless Leg Syndrome since age 23yo  6. OA  7. peripheral neuropathy  8. OHS/OSA: Home oxygen during the day, CPAP at night.   9. Chronic nausea/diarrhea: ? IBS  10. Diastolic CHF: Echo (3/11) with normal LV size, moderate LVH, EF 65-70%, LV mid-cavity gradient reaching 63 mmHg with valsalva but minimal at rest, grade I diastolic dysfunction, cannot estimate PA systolic pressure (no TR doppler signal), RV normal. RHC (4/11): Mean RA 11, RV 37/13, PA 33/15, mean PCWP 12, CI 3.3. Echo in 11/12 showed mild  LVH, EF 65-70%, no LV mid-cavity gradient was mentioned.  Echo in 4/14 showed EF 65-70%, mild LVH, grade I diastolic dysfunction, PA systolic pressure 35 mmHg, mild LV mid-cavity gradient.  11. ETT-myoview (4/11): 5'30", stopped due to fatigue, normal EF, no evidence for scar or ischemia. 12. V/Q scan 11/12: negative for PE.  Lower extremity venous doppler US (3/14): negative for DVT.  13. PFTs (5/13): FVC 50%, FEV1 62%, ratio 106%, DLCO 69%, TLC 69%.   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   ROS: All systems reviewed and negative except as per HPI.   Current Outpatient Prescriptions  Medication Sig Dispense Refill  . aspirin 81 MG tablet Take 81 mg by mouth daily.       . carvedilol (COREG) 25 MG tablet Take 1 tablet (25 mg total) by mouth 2 (two) times daily with a meal. 1 and 1/2 tablets (total 37.5mg ) two times a day  90 tablet  3  . Coenzyme Q10 (COQ10) 30 MG CAPS Take 1 capsule by mouth daily.       [name] doxycycline (VIBRA-TABS) 100 MG tablet Take 1 tablet (100 mg total) by mouth every 12 (twelve) hours.  10 tablet  0  . fenofibrate 160 MG tablet Take 1 tablet (160 mg total) by mouth daily.  30 tablet  5  . flyticasone (CUTIVATE) 0.005 % ointment Apply topically 3 (three) times daily as needed. For itching  60 g  2  . gabapentin (NEURONTIN) 400 MG capsule Take 400 mg by mouth 2 (two) times daily.      . hydrALAZINE (APRESOLINE) 50 MG tablet Take 1 tablet (50 mg total) by mouth 3 (three) times daily.  90 tablet  6  . insulin glargine (LANTUS) 100 UNIT/ML injection Inject 60 Units into the skin at bedtime.  10 mL  12  . insulin glulisine (APIDRA) 100 UNIT/ML injection Inject 27-29 Units into the skin 3 (three) times daily before meals. Takes 29 units in the am, 28 units midday & 27 units at night      . Magnesium 200 MG TABS Take 1 tablet by mouth 2 (two) times daily. 1 tablet two  times a day  60 each    . metFORMIN (GLUCOPHAGE) 500 MG tablet Take 500-1,000 mg by mouth 3 (three) times daily between meals as needed. For elevated blood sugar      . nystatin (MYCOSTATIN) powder Apply topically 4 (four) times daily as needed. For topical infections      . omega-3 acid ethyl esters (LOVAZA) 1 G capsule Take 2 g by mouth 2 (two) times daily.      . ONE TOUCH ULTRA TEST test strip USE TO CHECK BLOOD GLUCOSE TWICE DAILY  100 each  0  . oxyCODONE-acetaminophen (PERCOCET/ROXICET) 5-325 MG per tablet Take 1 tablet by mouth every 6 (six)  hours as needed. for pain      . potassium chloride (K-DUR) 10 MEQ tablet Take 30 mEq by mouth 2 (two) times daily. 3 tablets (total 30 mEq) two times a day      . rOPINIRole (REQUIP) 2 MG tablet Take 2 mg by mouth 2 (two) times daily.      [name] torsemide (DEMADEX) 20 MG tablet Take 3 of your 20mg  tablets (total 60mg ) two times a day  180 tablet  6   No current facility-administered medications for this visit.    BP 128/52  Pulse 74  Ht 5\' 2"  (1.575 m)  Wt 266 lb (120.657 kg)  BMI 48.64 kg/m2  SpO2 96% General: NAD, obese Neck: Thick, JVP 9-10 cm, no thyromegaly or thyroid nodule.  Lungs: Clear to auscultation bilaterally with normal respiratory effort. CV: Nondisplaced PMI.  Heart regular S1/S2, no S3/S4, 2/6 SEM RUSB.  1+ ankle edema bilaterally.  No carotid bruit.  Abdomen: Soft, nontender, no hepatosplenomegaly, no distention.  Skin: Venous stasis changes lower legs bilaterally.    Neurologic: Alert and oriented x 3.  Psych: Normal affect. Extremities: No clubbing or cyanosis.   Assessment/Plan: 1. Chronic diastolic CHF:  Status post recent admission with diuresis.  She does still appear volume overloaded with elevated JVP.  Venous insufficiency almost certainly contributes to her lower leg edema (also consider role for lymphedema).  She has a systolic murmur that corresponds to the LV mid-cavity gradient seen on prior echoes.  - Continue  Coreg as there is still a mid-cavity gradient present on echo (mild compared to past).   - I will have her take torsemide at 60 mg bid (the dose she was supposed to go home from the hospital on) rather than 80 mg once daily.     - Check BMET/BNP in 2 wks.   - Followup in the office in 2 wks.  2. Pulmonary: Suspect OHS/OSA.  Started on CPAP and home oxygen during last hospitalization with hypercarbic respiratory failure.   - Continue home oxygen and CPAP.  - Arrange for sleep study to formalize CPAP.  - Arrange for followup with pulmonary (Dr. Vassie Loll).  3. Morbid obesity: Needs weight loss but very difficult given impaired mobility from arthritis.  4. Renal: History of AKI with aggressive diuresis.  Follow renal function carefully.  5. Hyperlipidemia: Will check lipids today.    Marca Ancona 07/19/2012   Marca Ancona 07/19/2012 1:39 PM

## 2012-07-19 NOTE — Patient Instructions (Addendum)
Take torsemide 60mg  two times a day. This will be 3 of your 20mg  tablets two times a day.  Your physician has recommended that you have a sleep study. This test records several body functions during sleep, including: brain activity, eye movement, oxygen and carbon dioxide blood levels, heart rate and rhythm, breathing rate and rhythm, the flow of air through your mouth and nose, snoring, body muscle movements, and chest and belly movement.  Your physician recommends that you schedule a follow-up appointment to see Dr Vassie Loll after the sleep stud has been completed.    Your physician recommends that you schedule a follow-up appointment in: 2 weeks with Dr Shirlee Latch.   Your physician recommends that you return for a FASTING lipid profile /BMET/BNP in 2 weeks when you see Dr Shirlee Latch.

## 2012-07-20 DIAGNOSIS — I509 Heart failure, unspecified: Secondary | ICD-10-CM | POA: Diagnosis not present

## 2012-07-20 DIAGNOSIS — I10 Essential (primary) hypertension: Secondary | ICD-10-CM | POA: Diagnosis not present

## 2012-07-20 DIAGNOSIS — L97909 Non-pressure chronic ulcer of unspecified part of unspecified lower leg with unspecified severity: Secondary | ICD-10-CM | POA: Diagnosis not present

## 2012-07-20 DIAGNOSIS — I5031 Acute diastolic (congestive) heart failure: Secondary | ICD-10-CM | POA: Diagnosis not present

## 2012-07-20 DIAGNOSIS — E109 Type 1 diabetes mellitus without complications: Secondary | ICD-10-CM | POA: Diagnosis not present

## 2012-07-20 DIAGNOSIS — I872 Venous insufficiency (chronic) (peripheral): Secondary | ICD-10-CM | POA: Diagnosis not present

## 2012-07-24 ENCOUNTER — Encounter: Payer: Self-pay | Admitting: Endocrinology

## 2012-07-24 ENCOUNTER — Ambulatory Visit (INDEPENDENT_AMBULATORY_CARE_PROVIDER_SITE_OTHER): Payer: Medicare Other | Admitting: Endocrinology

## 2012-07-24 VITALS — BP 136/80 | HR 76 | Ht 63.0 in | Wt 272.0 lb

## 2012-07-24 DIAGNOSIS — E1029 Type 1 diabetes mellitus with other diabetic kidney complication: Secondary | ICD-10-CM

## 2012-07-24 DIAGNOSIS — E1065 Type 1 diabetes mellitus with hyperglycemia: Secondary | ICD-10-CM | POA: Diagnosis not present

## 2012-07-24 LAB — TSH: TSH: 1.59 u[IU]/mL (ref 0.35–5.50)

## 2012-07-24 LAB — HEMOGLOBIN A1C: Hgb A1c MFr Bld: 9.3 % — ABNORMAL HIGH (ref 4.6–6.5)

## 2012-07-24 LAB — T4, FREE: Free T4: 0.8 ng/dL (ref 0.60–1.60)

## 2012-07-24 LAB — T3, FREE: T3, Free: 2.5 pg/mL (ref 2.3–4.2)

## 2012-07-24 NOTE — Progress Notes (Signed)
Subjective:    Patient ID: Melissa Myers, female    DOB: February 03, 1946, 67 y.o.   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  She was last seen here more than 1 year ago.  She has no cbg record, but states cbg's vary from 139-289.  There is no trend throughout the day.  pt states she feels well in general. Past Medical History  Diagnosis Date  . Type II or unspecified type diabetes mellitus without mention of complication, not stated as uncontrolled     insulin dep  . Hyperlipidemia     hx rhabdo on statins  . Hypertension   . CHF (congestive heart failure)     diastolic  . OSA (obstructive sleep apnea)     insignificant apnea events 05/2009 sleep study  . Chronic diarrhea     a/w nausea - felt related to IBS  . RLS (restless legs syndrome)   . Osteoarthritis   . Stasis dermatitis   . Shortness of breath   . GERD (gastroesophageal reflux disease)   . Anemia     Past Surgical History  Procedure Laterality Date  . Tubal ligation  1980  . Cholecystectomy  1997  . Tonsillectomy  1970  . Uterine polyp removal  2008  . Umbilical hernia repair  1995    History   Social History  . Marital Status: Married    Spouse Name: N/A    Number of Children: N/A  . Years of Education: N/A   Occupational History  . Not on file.   Social History Main Topics  . Smoking status: Never Smoker   . Smokeless tobacco: Never Used  . Alcohol Use: No  . Drug Use: No  . Sexually Active: No   Other Topics Concern  . Not on file   Social History Narrative   Lives with spouse and mother. Retired Diplomatic Services operational officer, now housewife    Current Outpatient Prescriptions on File Prior to Visit  Medication Sig Dispense Refill  . aspirin 81 MG tablet Take 81 mg by mouth daily.       . carvedilol (COREG) 25 MG tablet Take 1 tablet (25 mg total) by mouth 2  (two) times daily with a meal. 1 and 1/2 tablets (total 37.5mg ) two times a day  90 tablet  3  . Coenzyme Q10 (COQ10) 30 MG CAPS Take 1 capsule by mouth daily.       . fenofibrate 160 MG tablet Take 1 tablet (160 mg total) by mouth daily.  30 tablet  5  . flyticasone (CUTIVATE) 0.005 % ointment Apply topically 3 (three) times daily as needed. For itching  60 g  2  . gabapentin (NEURONTIN) 400 MG capsule Take 400 mg by mouth 2 (two) times daily.      . hydrALAZINE (APRESOLINE) 50 MG tablet Take 1 tablet (50 mg total) by mouth 3 (three) times daily.  90 tablet  6  . insulin glulisine (APIDRA) 100 UNIT/ML injection Inject 35 Units into the skin 3 (three) times daily before meals.       . Magnesium 200 MG TABS Take 1 tablet by mouth 2 (two) times daily. 1 tablet two times a day  60 each    . nystatin (MYCOSTATIN) powder Apply topically 4 (four) times daily as needed. For topical infections      . omega-3 acid ethyl esters (  LOVAZA) 1 G capsule Take 2 g by mouth 2 (two) times daily.      . ONE TOUCH ULTRA TEST test strip USE TO CHECK BLOOD GLUCOSE TWICE DAILY  100 each  0  . oxyCODONE-acetaminophen (PERCOCET/ROXICET) 5-325 MG per tablet Take 1 tablet by mouth every 6 (six) hours as needed. for pain      . potassium chloride (K-DUR) 10 MEQ tablet Take 30 mEq by mouth 2 (two) times daily. 3 tablets (total 30 mEq) two times a day      . rOPINIRole (REQUIP) 2 MG tablet Take 2 mg by mouth 2 (two) times daily.      [name] torsemide (DEMADEX) 20 MG tablet Take 3 of your 20mg  tablets (total 60mg ) two times a day  180 tablet  6   No current facility-administered medications on file prior to visit.    Allergies  Allergen Reactions  . Levemir (Insulin Detemir)     itching  . Morphine     REACTION: GI upset and headaches  . Sulfa Antibiotics Diarrhea  . Sulfonamide Derivatives   . Zinc Swelling and Rash    Family History  Problem Relation Age of Onset  . Heart disease Mother 19    MI  . Heart disease  Father 30    MI  . Heart disease Other     MI- uncle and multiple aunts (aunts with CVAs)  . Cancer Other     Stomach cancer grandmother    BP 136/80  Pulse 76  Ht 5\' 3"  (1.6 m)  Wt 272 lb (123.378 kg)  BMI 48.19 kg/m2  SpO2 96%  Review of Systems denies hypoglycemia, but she has weight gain.    Objective:   Physical Exam VITAL SIGNS:  See vs page GENERAL: no distress.  obese  Lab Results  Component Value Date   HGBA1C 9.3* 07/24/2012      Assessment & Plan:  DM: she needs increased rx.  This insulin regimen was chosen from multiple options, as it best matches her insulin to her changing requirements throughout the day.  The benefits of glycemic control must be weighed against the risks of hypoglycemia.

## 2012-07-24 NOTE — Patient Instructions (Addendum)
Please come back for a follow-up appointment in 3 months check your blood sugar 2 times a day.  vary the time of day when you check, between before the 3 meals, and at bedtime.  also check if you have symptoms of your blood sugar being too high or too low.  please keep a record of the readings and bring it to your next appointment here.  please call us sooner if your blood sugar goes below 70, or if it stays over 200.   blood tests are being requested for you today.  We'll contact you with results.

## 2012-07-25 DIAGNOSIS — I509 Heart failure, unspecified: Secondary | ICD-10-CM | POA: Diagnosis not present

## 2012-07-25 DIAGNOSIS — L97909 Non-pressure chronic ulcer of unspecified part of unspecified lower leg with unspecified severity: Secondary | ICD-10-CM | POA: Diagnosis not present

## 2012-07-25 DIAGNOSIS — E109 Type 1 diabetes mellitus without complications: Secondary | ICD-10-CM | POA: Diagnosis not present

## 2012-07-25 DIAGNOSIS — I5031 Acute diastolic (congestive) heart failure: Secondary | ICD-10-CM | POA: Diagnosis not present

## 2012-07-25 DIAGNOSIS — I872 Venous insufficiency (chronic) (peripheral): Secondary | ICD-10-CM | POA: Diagnosis not present

## 2012-07-25 DIAGNOSIS — I10 Essential (primary) hypertension: Secondary | ICD-10-CM | POA: Diagnosis not present

## 2012-07-26 ENCOUNTER — Ambulatory Visit (INDEPENDENT_AMBULATORY_CARE_PROVIDER_SITE_OTHER): Payer: Medicare Other | Admitting: Internal Medicine

## 2012-07-26 ENCOUNTER — Other Ambulatory Visit (INDEPENDENT_AMBULATORY_CARE_PROVIDER_SITE_OTHER): Payer: Medicare Other

## 2012-07-26 ENCOUNTER — Encounter: Payer: Self-pay | Admitting: Internal Medicine

## 2012-07-26 VITALS — BP 132/60 | HR 77 | Temp 97.9°F | Wt 271.1 lb

## 2012-07-26 DIAGNOSIS — I5032 Chronic diastolic (congestive) heart failure: Secondary | ICD-10-CM

## 2012-07-26 DIAGNOSIS — G2581 Restless legs syndrome: Secondary | ICD-10-CM

## 2012-07-26 DIAGNOSIS — D649 Anemia, unspecified: Secondary | ICD-10-CM

## 2012-07-26 DIAGNOSIS — D539 Nutritional anemia, unspecified: Secondary | ICD-10-CM | POA: Insufficient documentation

## 2012-07-26 DIAGNOSIS — Z1211 Encounter for screening for malignant neoplasm of colon: Secondary | ICD-10-CM | POA: Diagnosis not present

## 2012-07-26 LAB — BASIC METABOLIC PANEL
Calcium: 9.6 mg/dL (ref 8.4–10.5)
GFR: 55.55 mL/min — ABNORMAL LOW (ref >60.00)
Potassium: 4.5 mEq/L (ref 3.5–5.1)
Sodium: 139 mEq/L (ref 135–145)

## 2012-07-26 LAB — CBC
RBC: 4.68 Mil/uL (ref 3.87–5.11)
RDW: 18.1 % — ABNORMAL HIGH (ref 11.5–14.6)
WBC: 8.4 10*3/uL (ref 4.5–10.5)

## 2012-07-26 LAB — FERRITIN: Ferritin: 34.9 ng/mL (ref 10.0–291.0)

## 2012-07-26 LAB — MAGNESIUM: Magnesium: 2.2 mg/dL (ref 1.5–2.5)

## 2012-07-26 MED ORDER — GABAPENTIN 300 MG PO CAPS: 300.0000 mg | ORAL_CAPSULE | Freq: Three times a day (TID) | ORAL | Status: DC

## 2012-07-26 NOTE — Patient Instructions (Signed)
It was good to see you today. We have reviewed your prior records including labs and tests today Test(s) ordered today. Your results will be released to MyChart (or called to you) after review, usually within 72hours after test completion. If any changes need to be made, you will be notified at that same time. we'll make referral to Dr Leone Payor for colon followup . Our office will contact you regarding appointment(s) once made. Please schedule followup in 6 weeks, call sooner if problems.

## 2012-07-26 NOTE — Assessment & Plan Note (Signed)
Chronic, microcytic Recheck CBC now with ferritin Last colonoscopy October 2003 - refer back to GI for followup at this time

## 2012-07-26 NOTE — Assessment & Plan Note (Signed)
On Requip for same for years -  Sleep study 05/2009 reviewed - no significant apnea events - due for repeat 08/15/12 Working with Vassie Loll (pulm) on this and overlapping OSA The current medical regimen is effective;  continue present plan and medications.

## 2012-07-26 NOTE — Assessment & Plan Note (Signed)
Following with cardiology for same, volume status complicated by renal insufficiency and chronic venous insufficiency Remain on loop diuretics (torsemide more effective than furosemide)-  Cards changed amlodipine to hydralazine s/p RHC 04/06/11 hosp for exac 05/2012 Per cardiology, also on Coreg to decrease of the mid cavity gradient Medications and interval hx reviewed, reminded of importance of diuretic, diet and other med compliance

## 2012-07-26 NOTE — Progress Notes (Signed)
Subjective:    Patient ID: Melissa Myers, female    DOB: [birth year], 67 y.o.   MRN: [id number]  HPI   Here for follow up - reviewed chronic medical issues and interval history Admit date: 07/11/2012 Discharge date: 07/16/2012  Time spent: >30 minutes  Recommendations for Outpatient Follow-up:  -BMET to follow electrolytes and renal function -Sleep study to be arranged as an outpatient for specific water pressure on her CPAP -reassess BP and adjust medications as needed -close follow up on breathing status.  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 Type II or unspecified type diabetes mellitus without mention of complication, not stated as uncontrolled     insulin dep  . Hyperlipidemia     hx rhabdo on statins  . Hypertension   . CHF (congestive heart failure)     diastolic  . OSA (obstructive sleep apnea)     insignificant apnea events 05/2009 sleep study  . Chronic diarrhea     a/w nausea - felt related to IBS  . RLS (restless legs syndrome)   . Osteoarthritis   . Stasis dermatitis   . Shortness of breath   . GERD (gastroesophageal reflux disease)   . Anemia      Review of Systems  Constitutional: Positive for fatigue. Negative for fever and unexpected weight change.  Respiratory: Positive for shortness of breath (chronic). Negative for cough, chest tightness and wheezing.   Cardiovascular: Negative for chest pain and palpitations. Leg swelling: chronic.  Musculoskeletal: Positive for back  pain.  Skin: Positive for rash. Negative for wound.       Objective:   Physical Exam  BP 132/60  Pulse 77  Temp(Src) 97.9 F (36.6 C) (Oral)  Wt 271 lb 1.9 oz (122.979 kg)  BMI 48.04 kg/m2  SpO2 90% Wt Readings from Last 3 Encounters:  07/26/12 271 lb 1.9 oz (122.979 kg)  07/24/12 272 lb (123.378 kg)  07/19/12 266 lb (120.657 kg)   Constitutional: She is overweight; appears well-developed and well-nourished. No distress. mom at side Neck: Thick; Normal range of motion. Neck supple. No JVD present. No thyromegaly present.  Cardiovascular: Normal rate, regular rhythm and normal heart sounds.  No murmur heard. chronic tight/woody changes with 1+ BLE edema Pulmonary/Chest: Effort normal and breath sounds diminished at bases bilaterally. No respiratory distress. She has no wheezes.  Skin: chronic thickening and min  erythema of distal BLE, stasis dermatitis without change - resolution of pustules on anterior shin - RLE distal leg with single ulceration medially above malleolus, LLE distally with cracking but no ulceration. Psychiatric: She has a dysthymic mood and affect. Her behavior is normal. Judgment and thought content normal.       Lab Results  Component Value Date   WBC 8.1 07/12/2012   HGB 9.7* 07/12/2012   HCT 31.6* 07/12/2012   PLT 271 07/12/2012   CHOL 210* 06/08/2012   TRIG 187.0* 06/08/2012   HDL 42.60 06/08/2012   LDLDIRECT 126.9 06/08/2012   ALT 19 07/11/2012  AST 26 07/11/2012   NA 141 07/16/2012   K 4.1 07/16/2012   CL 98 07/16/2012   CREATININE 1.00 07/16/2012   BUN 30* 07/16/2012   CO2 35* 07/16/2012   TSH 1.59 07/24/2012   INR 1.10 07/11/2012   HGBA1C 9.3* 07/24/2012    Assessment & Plan:   See problem list. Medications and labs reviewed today.  Time spent with pt/family today 25 minutes, greater than 50% time spent counseling patient on hosp for dHF, diabetes, RLS, OSA and medication review. Also review of prior records

## 2012-07-27 ENCOUNTER — Encounter: Payer: Self-pay | Admitting: Internal Medicine

## 2012-07-27 DIAGNOSIS — I509 Heart failure, unspecified: Secondary | ICD-10-CM | POA: Diagnosis not present

## 2012-07-27 DIAGNOSIS — I872 Venous insufficiency (chronic) (peripheral): Secondary | ICD-10-CM | POA: Diagnosis not present

## 2012-07-27 DIAGNOSIS — E109 Type 1 diabetes mellitus without complications: Secondary | ICD-10-CM | POA: Diagnosis not present

## 2012-07-27 DIAGNOSIS — L97909 Non-pressure chronic ulcer of unspecified part of unspecified lower leg with unspecified severity: Secondary | ICD-10-CM | POA: Diagnosis not present

## 2012-07-27 DIAGNOSIS — I10 Essential (primary) hypertension: Secondary | ICD-10-CM | POA: Diagnosis not present

## 2012-07-27 DIAGNOSIS — I5031 Acute diastolic (congestive) heart failure: Secondary | ICD-10-CM | POA: Diagnosis not present

## 2012-07-30 ENCOUNTER — Other Ambulatory Visit: Payer: Self-pay | Admitting: Internal Medicine

## 2012-07-31 ENCOUNTER — Other Ambulatory Visit: Payer: Self-pay | Admitting: Cardiology

## 2012-08-01 DIAGNOSIS — E109 Type 1 diabetes mellitus without complications: Secondary | ICD-10-CM | POA: Diagnosis not present

## 2012-08-01 DIAGNOSIS — I509 Heart failure, unspecified: Secondary | ICD-10-CM | POA: Diagnosis not present

## 2012-08-01 DIAGNOSIS — I872 Venous insufficiency (chronic) (peripheral): Secondary | ICD-10-CM | POA: Diagnosis not present

## 2012-08-01 DIAGNOSIS — L97909 Non-pressure chronic ulcer of unspecified part of unspecified lower leg with unspecified severity: Secondary | ICD-10-CM | POA: Diagnosis not present

## 2012-08-01 DIAGNOSIS — I5031 Acute diastolic (congestive) heart failure: Secondary | ICD-10-CM | POA: Diagnosis not present

## 2012-08-01 DIAGNOSIS — I10 Essential (primary) hypertension: Secondary | ICD-10-CM | POA: Diagnosis not present

## 2012-08-03 ENCOUNTER — Encounter: Payer: Self-pay | Admitting: Cardiology

## 2012-08-03 ENCOUNTER — Other Ambulatory Visit (INDEPENDENT_AMBULATORY_CARE_PROVIDER_SITE_OTHER): Payer: Medicare Other

## 2012-08-03 ENCOUNTER — Ambulatory Visit (INDEPENDENT_AMBULATORY_CARE_PROVIDER_SITE_OTHER): Payer: Medicare Other | Admitting: Cardiology

## 2012-08-03 VITALS — BP 141/67 | HR 58 | Ht 60.5 in | Wt 274.0 lb

## 2012-08-03 DIAGNOSIS — E109 Type 1 diabetes mellitus without complications: Secondary | ICD-10-CM | POA: Diagnosis not present

## 2012-08-03 DIAGNOSIS — I5032 Chronic diastolic (congestive) heart failure: Secondary | ICD-10-CM | POA: Diagnosis not present

## 2012-08-03 DIAGNOSIS — E669 Obesity, unspecified: Secondary | ICD-10-CM | POA: Diagnosis not present

## 2012-08-03 DIAGNOSIS — R0602 Shortness of breath: Secondary | ICD-10-CM

## 2012-08-03 DIAGNOSIS — I5031 Acute diastolic (congestive) heart failure: Secondary | ICD-10-CM | POA: Diagnosis not present

## 2012-08-03 DIAGNOSIS — I5022 Chronic systolic (congestive) heart failure: Secondary | ICD-10-CM | POA: Diagnosis not present

## 2012-08-03 DIAGNOSIS — I509 Heart failure, unspecified: Secondary | ICD-10-CM | POA: Diagnosis not present

## 2012-08-03 DIAGNOSIS — R0989 Other specified symptoms and signs involving the circulatory and respiratory systems: Secondary | ICD-10-CM

## 2012-08-03 DIAGNOSIS — L97909 Non-pressure chronic ulcer of unspecified part of unspecified lower leg with unspecified severity: Secondary | ICD-10-CM | POA: Diagnosis not present

## 2012-08-03 DIAGNOSIS — E785 Hyperlipidemia, unspecified: Secondary | ICD-10-CM

## 2012-08-03 DIAGNOSIS — I10 Essential (primary) hypertension: Secondary | ICD-10-CM | POA: Diagnosis not present

## 2012-08-03 DIAGNOSIS — I872 Venous insufficiency (chronic) (peripheral): Secondary | ICD-10-CM | POA: Diagnosis not present

## 2012-08-03 LAB — LIPID PANEL
Cholesterol: 221 mg/dL — ABNORMAL HIGH (ref 0–200)
HDL: 54.8 mg/dL (ref >39.00)
Triglycerides: 188 mg/dL — ABNORMAL HIGH (ref 0.0–149.0)

## 2012-08-03 LAB — BASIC METABOLIC PANEL
BUN: 27 mg/dL — ABNORMAL HIGH (ref 6–23)
CO2: 35 mEq/L — ABNORMAL HIGH (ref 19–32)
Calcium: 10.1 mg/dL (ref 8.4–10.5)
GFR: 63.9 mL/min (ref >60.00)
Glucose, Bld: 156 mg/dL — ABNORMAL HIGH (ref 70–99)

## 2012-08-03 MED ORDER — METOLAZONE 2.5 MG PO TABS: ORAL_TABLET | ORAL | Status: DC

## 2012-08-03 NOTE — Patient Instructions (Addendum)
Your physician recommends that you schedule a follow-up appointment in:1 month with Dr. Shirlee Latch in CHF clinic at [facility]  Your physician recommends that you return for lab work in:  2 weeks. --BMP, BNP  We will call the pulmonary office to see if sleep study can be done sooner than scheduled.   Your physician has recommended you make the following change in your medication: Start metolazone 2.5 mg by mouth 30 minutes prior to morning dose of Torsemide once per week on Saturday.  Increase potassium to 3 times per day on the day you take metolazone.

## 2012-08-04 NOTE — Progress Notes (Signed)
Patient ID: Melissa Myers, female   DOB: 1945-04-07, 67 y.o.   MRN: [id number] PCP: Dr. Felicity Coyer  67 yo with history of HTN, DM, hyperlipidemia, obesity, and chronic dyspnea/diastolic CHF returns for cardiac evaluation. Patient had an echo in 3/11 showing moderate LVH and preserved LV systolic function. However, there was a very large LV mid-cavity gradient with valsalva.  Patient developed quite significant exertional dyspnea to the point where she was short of breath walking around her house. She had a pulmonary evaluation with Dr. Vassie Loll but no primary lung problems were identified. Patient had an ETT-myoview in 4/11 which was negative for ischemia or infarction. Right heart cath in 4/11 showed mildly elevated right heart filling pressures but normal PA pressure and normal PCWP. I started her on a beta blocker (Coreg) to try to lower her LV mid-cavity gradient (that likely occurs with exertion) and to better control BP.  V/Q scan was negative for PE.  PFTs from 5/13 showed a restrictive defect. She had a recent repeat echo in 4/14 with normal EF, mild mid-cavity LV gradient.     Weight is up 8 lbs today compared to prior appointment.   Her breathing is stable, however.  She is able to walk around her house without significant dyspnea but gets short of breath going much further.  No chest pain.  No PND.  She has been sleepy during the day.   Labs (4/11): TGs 539, HDL 58, LDL 128, K 5.2, creatinine 1.3, TSH normal, BNP 14  Labs (5/11): K 3.9, creatinine 1.0  Labs (9/11): creatinine 1.1, BUN 43, LDL 145, HDL 60  Labs (10/11): K 4.3, creatinine 0.81, HCT 34, LDL 115, HDL 75  Labs (11/12): K 4.2, creatinine 1.58 => 3.0, HCT 32.2, BNP 172, LDL 97, TGs 980 Labs (12/12): K 3.4, creatinine 1.2 => 1.7 Labs (1/13): K 3.9, creatinine 1.1, TGs 281, LDL 121 Labs (2/13): K 3.3, creatinine 1.2 Labs (10/13): K 4.1, creatinine 0.85 Labs (4/14): K 3.7, creatinine 1.2, BUN 43, BNP 23 Labs (5/14): K 4.1, creatinine  1.0  Allergies (verified):  1) ! Sulfa  2) Morphine   Past Medical History:  1. Diabetes mellitus, type II  2. Hyperlipidemia: She has been unable to tolerate statins (has been on Vytorin, lovastatin, and Crestor) due to what sounds like rhabdo: developed muscle weakness and was told she had "muscle damage" with each of these statins. She has been told not to take statins anymore.  3. Hypertension: prev was told she had "white coat" HTN  4. Obesity  5. Restless Leg Syndrome since age 51yo  6. OA  7. peripheral neuropathy  8. OHS/OSA: Home oxygen during the day, CPAP at night.   9. Chronic nausea/diarrhea: ? IBS  10. Diastolic CHF: Echo (3/11) with normal LV size, moderate LVH, EF 65-70%, LV mid-cavity gradient reaching 63 mmHg with valsalva but minimal at rest, grade I diastolic dysfunction, cannot estimate PA systolic pressure (no TR doppler signal), RV normal. RHC (4/11): Mean RA 11, RV 37/13, PA 33/15, mean PCWP 12, CI 3.3. Echo in 11/12 showed mild LVH, EF 65-70%, no LV mid-cavity gradient was mentioned.  Echo in 4/14 showed EF 65-70%, mild LVH, grade I diastolic dysfunction, PA systolic pressure 35 mmHg, mild LV mid-cavity gradient.  11. ETT-myoview (4/11): 5'30", stopped due to fatigue, normal EF, no evidence for scar or ischemia. 12. V/Q scan 11/12: negative for PE.  Lower extremity venous doppler US (3/14): negative for DVT.  13. PFTs (5/13): FVC 50%,  FEV1 62%, ratio 106%, DLCO 69%, TLC 69%.   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   ROS: All systems reviewed and negative except as per HPI.   Current Outpatient Prescriptions  Medication Sig Dispense Refill  . aspirin 81 MG tablet Take 81 mg by mouth daily.       . carvedilol (COREG) 25 MG tablet Take 1 tablet (25 mg total) by mouth 2 (two) times daily with a meal. 1 and 1/2 tablets (total 37.5mg ) two times a day  90 tablet  3  . Coenzyme Q10 (COQ10) 30 MG CAPS Take 1 capsule by mouth daily.       . fenofibrate 160 MG tablet Take 1 tablet (160 mg total) by mouth daily.  30 tablet  5  . gabapentin (NEURONTIN) 300 MG capsule Take 1 capsule (300 mg total) by mouth 3 (three) times daily.      . hydrALAZINE (APRESOLINE) 50 MG tablet Take 1 tablet (50 mg total) by mouth 3 (three) times daily.  90 tablet  6  . insulin glargine (LANTUS) 100 UNIT/ML injection Inject 44 Units into the skin at bedtime.      . insulin glulisine (APIDRA) 100 UNIT/ML injection Inject 35 Units into the skin 3 (three) times daily before meals.       . Magnesium 200 MG TABS Take 1 tablet by mouth 2 (two) times daily. 1 tablet two times a day  60 each    . omega-3 acid ethyl esters (LOVAZA) 1 G capsule TAKE 2 CAPSULES BY MOUTH TWICE DAILY  120 capsule  2  . ONE TOUCH ULTRA TEST test strip USE TO CHECK BLOOD GLUCOSE TWICE DAILY  100 each  0  . oxyCODONE-acetaminophen (PERCOCET/ROXICET) 5-325 MG per tablet Take 1 tablet by mouth every 6 (six) hours as needed. for pain      . potassium chloride (K-DUR) 10 MEQ tablet Take 30 mEq by mouth 2 (two) times daily. 3 tablets (total 30 mEq) two times a day      . rOPINIRole (REQUIP) 2 MG tablet Take 2 mg by mouth 2 (two) times daily.      [name] torsemide (DEMADEX) 20 MG tablet Take 3 of your 20mg  tablets (total 60mg ) two times a day  180 tablet  6  . metolazone  (ZAROXOLYN) 2.5 MG tablet Take one tablet by mouth 30 mins prior to morning dose of torsemide once per week on Saturday.  10 tablet  3   No current facility-administered medications for this visit.    BP 141/67  Pulse 58  Ht 5' 0.5" (1.537 m)  Wt 274 lb (124.286 kg)  BMI 52.61 kg/m2 General: NAD, obese Neck: Thick, JVP 12 cm, no thyromegaly or thyroid nodule.  Lungs: Clear to auscultation bilaterally with normal respiratory effort. CV: Nondisplaced PMI.  Heart regular S1/S2, no  S3/S4, 2/6 SEM RUSB.  Chronic edema to knees bilaterally.  No carotid bruit.  Abdomen: Soft, nontender, no hepatosplenomegaly, no distention.  Skin: Venous stasis changes lower legs bilaterally.    Neurologic: Alert and oriented x 3.  Psych: Normal affect. Extremities: No clubbing or cyanosis.   Assessment/Plan: 1. Chronic diastolic CHF:   She does still appear volume overloaded with elevated JVP.  Venous insufficiency almost certainly contributes to her lower leg edema (also consider role for lymphedema).  She has a systolic murmur that corresponds to the LV mid-cavity gradient seen on prior echoes.  - Continue Coreg as there is still a mid-cavity gradient present on echo (mild compared to past).   - Continue torsemide and KCl at current doses. - Add metolazone 2.5 mg once weekly prior to am torsemide.  - BMET/BNP in 2 wks.  2. Pulmonary: Suspect OHS/OSA.  Started on CPAP and home oxygen during last hospitalization with hypercarbic respiratory failure.   - Continue home oxygen and CPAP.  - She has a sleep study scheduled to formalize CPAP dosing.  - She has pulmonary followup (Dr. Vassie Loll).  3. Morbid obesity: Needs weight loss but very difficult given impaired mobility from arthritis.  4. Renal: History of AKI with aggressive diuresis.  Follow renal function carefully.   Marca Ancona 08/04/2012   Marca Ancona 08/04/2012 11:13 PM

## 2012-08-06 ENCOUNTER — Ambulatory Visit (HOSPITAL_BASED_OUTPATIENT_CLINIC_OR_DEPARTMENT_OTHER): Payer: Medicare Other | Attending: Cardiology | Admitting: Radiology

## 2012-08-06 VITALS — Ht 62.0 in | Wt 272.0 lb

## 2012-08-06 DIAGNOSIS — G4733 Obstructive sleep apnea (adult) (pediatric): Secondary | ICD-10-CM | POA: Insufficient documentation

## 2012-08-06 DIAGNOSIS — E119 Type 2 diabetes mellitus without complications: Secondary | ICD-10-CM | POA: Insufficient documentation

## 2012-08-06 DIAGNOSIS — G2581 Restless legs syndrome: Secondary | ICD-10-CM | POA: Diagnosis not present

## 2012-08-06 DIAGNOSIS — I872 Venous insufficiency (chronic) (peripheral): Secondary | ICD-10-CM | POA: Diagnosis not present

## 2012-08-06 DIAGNOSIS — I5031 Acute diastolic (congestive) heart failure: Secondary | ICD-10-CM | POA: Diagnosis not present

## 2012-08-06 DIAGNOSIS — I10 Essential (primary) hypertension: Secondary | ICD-10-CM | POA: Diagnosis not present

## 2012-08-06 DIAGNOSIS — I509 Heart failure, unspecified: Secondary | ICD-10-CM | POA: Insufficient documentation

## 2012-08-06 DIAGNOSIS — R0602 Shortness of breath: Secondary | ICD-10-CM

## 2012-08-06 DIAGNOSIS — I5022 Chronic systolic (congestive) heart failure: Secondary | ICD-10-CM

## 2012-08-06 DIAGNOSIS — L97909 Non-pressure chronic ulcer of unspecified part of unspecified lower leg with unspecified severity: Secondary | ICD-10-CM | POA: Diagnosis not present

## 2012-08-06 DIAGNOSIS — E109 Type 1 diabetes mellitus without complications: Secondary | ICD-10-CM | POA: Diagnosis not present

## 2012-08-07 DIAGNOSIS — I5031 Acute diastolic (congestive) heart failure: Secondary | ICD-10-CM | POA: Diagnosis not present

## 2012-08-07 DIAGNOSIS — I872 Venous insufficiency (chronic) (peripheral): Secondary | ICD-10-CM | POA: Diagnosis not present

## 2012-08-07 DIAGNOSIS — I10 Essential (primary) hypertension: Secondary | ICD-10-CM | POA: Diagnosis not present

## 2012-08-07 DIAGNOSIS — L97909 Non-pressure chronic ulcer of unspecified part of unspecified lower leg with unspecified severity: Secondary | ICD-10-CM | POA: Diagnosis not present

## 2012-08-07 DIAGNOSIS — I509 Heart failure, unspecified: Secondary | ICD-10-CM | POA: Diagnosis not present

## 2012-08-07 DIAGNOSIS — E109 Type 1 diabetes mellitus without complications: Secondary | ICD-10-CM | POA: Diagnosis not present

## 2012-08-08 DIAGNOSIS — E109 Type 1 diabetes mellitus without complications: Secondary | ICD-10-CM | POA: Diagnosis not present

## 2012-08-08 DIAGNOSIS — L97909 Non-pressure chronic ulcer of unspecified part of unspecified lower leg with unspecified severity: Secondary | ICD-10-CM | POA: Diagnosis not present

## 2012-08-08 DIAGNOSIS — I872 Venous insufficiency (chronic) (peripheral): Secondary | ICD-10-CM | POA: Diagnosis not present

## 2012-08-08 DIAGNOSIS — I509 Heart failure, unspecified: Secondary | ICD-10-CM | POA: Diagnosis not present

## 2012-08-08 DIAGNOSIS — I5031 Acute diastolic (congestive) heart failure: Secondary | ICD-10-CM | POA: Diagnosis not present

## 2012-08-08 DIAGNOSIS — I10 Essential (primary) hypertension: Secondary | ICD-10-CM | POA: Diagnosis not present

## 2012-08-09 ENCOUNTER — Other Ambulatory Visit: Payer: Self-pay | Admitting: Endocrinology

## 2012-08-10 DIAGNOSIS — E109 Type 1 diabetes mellitus without complications: Secondary | ICD-10-CM | POA: Diagnosis not present

## 2012-08-10 DIAGNOSIS — I10 Essential (primary) hypertension: Secondary | ICD-10-CM | POA: Diagnosis not present

## 2012-08-10 DIAGNOSIS — I509 Heart failure, unspecified: Secondary | ICD-10-CM | POA: Diagnosis not present

## 2012-08-10 DIAGNOSIS — L97909 Non-pressure chronic ulcer of unspecified part of unspecified lower leg with unspecified severity: Secondary | ICD-10-CM | POA: Diagnosis not present

## 2012-08-10 DIAGNOSIS — I872 Venous insufficiency (chronic) (peripheral): Secondary | ICD-10-CM | POA: Diagnosis not present

## 2012-08-10 DIAGNOSIS — I5031 Acute diastolic (congestive) heart failure: Secondary | ICD-10-CM | POA: Diagnosis not present

## 2012-08-14 DIAGNOSIS — E109 Type 1 diabetes mellitus without complications: Secondary | ICD-10-CM | POA: Diagnosis not present

## 2012-08-14 DIAGNOSIS — I10 Essential (primary) hypertension: Secondary | ICD-10-CM | POA: Diagnosis not present

## 2012-08-14 DIAGNOSIS — I872 Venous insufficiency (chronic) (peripheral): Secondary | ICD-10-CM | POA: Diagnosis not present

## 2012-08-14 DIAGNOSIS — I5031 Acute diastolic (congestive) heart failure: Secondary | ICD-10-CM | POA: Diagnosis not present

## 2012-08-14 DIAGNOSIS — L97909 Non-pressure chronic ulcer of unspecified part of unspecified lower leg with unspecified severity: Secondary | ICD-10-CM | POA: Diagnosis not present

## 2012-08-14 DIAGNOSIS — I509 Heart failure, unspecified: Secondary | ICD-10-CM | POA: Diagnosis not present

## 2012-08-15 ENCOUNTER — Encounter (HOSPITAL_BASED_OUTPATIENT_CLINIC_OR_DEPARTMENT_OTHER): Payer: Medicare Other

## 2012-08-15 DIAGNOSIS — G4733 Obstructive sleep apnea (adult) (pediatric): Secondary | ICD-10-CM | POA: Diagnosis not present

## 2012-08-15 DIAGNOSIS — I509 Heart failure, unspecified: Secondary | ICD-10-CM | POA: Diagnosis not present

## 2012-08-15 DIAGNOSIS — L97909 Non-pressure chronic ulcer of unspecified part of unspecified lower leg with unspecified severity: Secondary | ICD-10-CM | POA: Diagnosis not present

## 2012-08-15 DIAGNOSIS — E109 Type 1 diabetes mellitus without complications: Secondary | ICD-10-CM | POA: Diagnosis not present

## 2012-08-15 DIAGNOSIS — I10 Essential (primary) hypertension: Secondary | ICD-10-CM | POA: Diagnosis not present

## 2012-08-15 DIAGNOSIS — I5031 Acute diastolic (congestive) heart failure: Secondary | ICD-10-CM

## 2012-08-15 DIAGNOSIS — I872 Venous insufficiency (chronic) (peripheral): Secondary | ICD-10-CM | POA: Diagnosis not present

## 2012-08-15 NOTE — Procedures (Signed)
NAMEMarland Myers  Melissa, Myers NO.:  [id number]  MEDICAL RECORD NO.:  [id number]          PATIENT TYPE:  OUT  LOCATION:  SLEEP CENTER                 FACILITY[facility]  [facility]  PHYSICIAN:  Oretha Milch, MD      DATE OF BIRTH:  [birth year]  DATE OF STUDY:  08/15/2012                           NOCTURNAL POLYSOMNOGRAM  REFERRING PHYSICIAN:  Marca Ancona, MD  INDICATION FOR STUDY:  Melissa Myers is a 67 year old woman with congestive heart failure, restless legs, diabetes.  She complains of loud snoring and choking episodes during sleep.  Of note, she had a polysomnogram in March 2011, when she weighed 227 pounds, which showed mild sleep-disordered breathing with predominant hypopneas during supine sleep.  This nocturnal polysomnogram was performed with sleep technologist in attendance.  EEG, EOG, EMG, EKG, and respiratory parameters were recorded.  Sleep stages, arousals, limb movements, and respiratory data were scored according to criteria laid out by the American Academy of Sleep Medicine.  At the time of this study, she weighed 272 pounds with a height of 5 feet 2 inches, BMI of 50, neck size of 16 inches.  EPWORTH SLEEPINESS SCORE:  17.  MEDICATIONS:  She took Requip at 8:45 a.m., and at 10 p.m.  She also took Coreg, Neurontin, hydralazine, and Lantus at bedtime.  SLEEP ARCHITECTURE:  Lights off was at 10:25 p.m.  Lights on was at 5:18 a.m.  Total sleep time was 401 minutes with a sleep period time of 409 minutes and sleep efficiency of 97%.  Sleep latency was 4 minutes. Latency to REM sleep was 335 minutes and wake after sleep onset was 18 minutes.  Sleep stages of the percentage of total sleep time was N1 5%, N2 90% N3 0.2%, and REM sleep 4%.  Supine sleep accounted for the entire duration of sleep.  Small appeared of REM was noted around 4 a.m.  AROUSAL DATA:  There were 82 arousals with an arousal index of 12 events per hour.  Most of these were spontaneous, and few  related to hypopneas.  RESPIRATORY DATA:  There were 9 obstructive apneas, 0 central apnea, 0 mixed apneas, and 31 hypopneas with an apnea-hypopnea index of 6 events per hour.  Few RERAs were noted with an RDI of 12 events per hour.  The longest hypopnea was 45 seconds.  OXYGEN DATA:  Desaturation index was 3 events per hour.  The lowest desaturation was 87%.  Note that this study was performed with 2 L of oxygen.  CARDIAC DATA:  The low heart rate was 46 beats per minute.  The high heart rate was 112 beats per minute.  No arrhythmias were noted.  MOVEMENT-PARASOMNIA:  No significant leg movements were noted; however, she did have a lot of arm movements.  DISCUSSION:  This study was performed on 2 L of oxygen.  She was desensitized with a medium full-face mask, but did not meet split night criteria.  IMPRESSION: 1. Mild obstructive sleep apnea with predominant hypopneas with mild     oxygen desaturation. 2. No evidence of cardiac arrhythmias, limb movements, or behavioral     disturbance during sleep.  Note that she took Requip the night of  the study.  RECOMMENDATION: 1. If she is overly symptomatic, treatment options for this degree of     sleep-disordered breathing include CPAP therapy or oral appliances.     Alternatively, no therapy or simply weight loss can be attempted. 2. No significant desaturations were noted.  Note that this study was     performed on 2 L of oxygen 3. She should be cautioned against driving when sleepy.  She should be     asked to avoid medications sedative side effects.     Oretha Milch, MD    RVA/MEDQ  D:  08/15/2012 09:26:58  T:  08/15/2012 10:01:05  Job:  [id number]

## 2012-08-17 ENCOUNTER — Other Ambulatory Visit (INDEPENDENT_AMBULATORY_CARE_PROVIDER_SITE_OTHER): Payer: Medicare Other

## 2012-08-17 DIAGNOSIS — I5031 Acute diastolic (congestive) heart failure: Secondary | ICD-10-CM | POA: Diagnosis not present

## 2012-08-17 DIAGNOSIS — R0602 Shortness of breath: Secondary | ICD-10-CM

## 2012-08-17 DIAGNOSIS — L97909 Non-pressure chronic ulcer of unspecified part of unspecified lower leg with unspecified severity: Secondary | ICD-10-CM | POA: Diagnosis not present

## 2012-08-17 DIAGNOSIS — I10 Essential (primary) hypertension: Secondary | ICD-10-CM

## 2012-08-17 DIAGNOSIS — E785 Hyperlipidemia, unspecified: Secondary | ICD-10-CM

## 2012-08-17 DIAGNOSIS — I5032 Chronic diastolic (congestive) heart failure: Secondary | ICD-10-CM | POA: Diagnosis not present

## 2012-08-17 DIAGNOSIS — E669 Obesity, unspecified: Secondary | ICD-10-CM | POA: Diagnosis not present

## 2012-08-17 DIAGNOSIS — I5022 Chronic systolic (congestive) heart failure: Secondary | ICD-10-CM

## 2012-08-17 DIAGNOSIS — I872 Venous insufficiency (chronic) (peripheral): Secondary | ICD-10-CM | POA: Diagnosis not present

## 2012-08-17 DIAGNOSIS — I509 Heart failure, unspecified: Secondary | ICD-10-CM | POA: Diagnosis not present

## 2012-08-17 DIAGNOSIS — E109 Type 1 diabetes mellitus without complications: Secondary | ICD-10-CM | POA: Diagnosis not present

## 2012-08-17 LAB — BASIC METABOLIC PANEL
BUN: 29 mg/dL — ABNORMAL HIGH (ref 6–23)
CO2: 32 mEq/L (ref 19–32)
Calcium: 9.2 mg/dL (ref 8.4–10.5)
GFR: 63.11 mL/min (ref >60.00)
Glucose, Bld: 177 mg/dL — ABNORMAL HIGH (ref 70–99)

## 2012-08-20 DIAGNOSIS — I872 Venous insufficiency (chronic) (peripheral): Secondary | ICD-10-CM | POA: Diagnosis not present

## 2012-08-20 DIAGNOSIS — I509 Heart failure, unspecified: Secondary | ICD-10-CM | POA: Diagnosis not present

## 2012-08-20 DIAGNOSIS — E109 Type 1 diabetes mellitus without complications: Secondary | ICD-10-CM | POA: Diagnosis not present

## 2012-08-20 DIAGNOSIS — I10 Essential (primary) hypertension: Secondary | ICD-10-CM | POA: Diagnosis not present

## 2012-08-20 DIAGNOSIS — L97909 Non-pressure chronic ulcer of unspecified part of unspecified lower leg with unspecified severity: Secondary | ICD-10-CM | POA: Diagnosis not present

## 2012-08-20 DIAGNOSIS — I5031 Acute diastolic (congestive) heart failure: Secondary | ICD-10-CM | POA: Diagnosis not present

## 2012-08-21 ENCOUNTER — Encounter: Payer: Self-pay | Admitting: Internal Medicine

## 2012-08-21 DIAGNOSIS — I872 Venous insufficiency (chronic) (peripheral): Secondary | ICD-10-CM | POA: Diagnosis not present

## 2012-08-21 DIAGNOSIS — E109 Type 1 diabetes mellitus without complications: Secondary | ICD-10-CM | POA: Diagnosis not present

## 2012-08-21 DIAGNOSIS — I5031 Acute diastolic (congestive) heart failure: Secondary | ICD-10-CM | POA: Diagnosis not present

## 2012-08-21 DIAGNOSIS — I509 Heart failure, unspecified: Secondary | ICD-10-CM | POA: Diagnosis not present

## 2012-08-21 DIAGNOSIS — L97909 Non-pressure chronic ulcer of unspecified part of unspecified lower leg with unspecified severity: Secondary | ICD-10-CM | POA: Diagnosis not present

## 2012-08-21 DIAGNOSIS — I10 Essential (primary) hypertension: Secondary | ICD-10-CM | POA: Diagnosis not present

## 2012-08-23 DIAGNOSIS — I10 Essential (primary) hypertension: Secondary | ICD-10-CM | POA: Diagnosis not present

## 2012-08-23 DIAGNOSIS — I872 Venous insufficiency (chronic) (peripheral): Secondary | ICD-10-CM | POA: Diagnosis not present

## 2012-08-23 DIAGNOSIS — I509 Heart failure, unspecified: Secondary | ICD-10-CM | POA: Diagnosis not present

## 2012-08-23 DIAGNOSIS — I5031 Acute diastolic (congestive) heart failure: Secondary | ICD-10-CM | POA: Diagnosis not present

## 2012-08-23 DIAGNOSIS — E109 Type 1 diabetes mellitus without complications: Secondary | ICD-10-CM | POA: Diagnosis not present

## 2012-08-23 DIAGNOSIS — L97909 Non-pressure chronic ulcer of unspecified part of unspecified lower leg with unspecified severity: Secondary | ICD-10-CM | POA: Diagnosis not present

## 2012-08-24 DIAGNOSIS — E109 Type 1 diabetes mellitus without complications: Secondary | ICD-10-CM | POA: Diagnosis not present

## 2012-08-24 DIAGNOSIS — L97909 Non-pressure chronic ulcer of unspecified part of unspecified lower leg with unspecified severity: Secondary | ICD-10-CM | POA: Diagnosis not present

## 2012-08-24 DIAGNOSIS — I872 Venous insufficiency (chronic) (peripheral): Secondary | ICD-10-CM | POA: Diagnosis not present

## 2012-08-24 DIAGNOSIS — I10 Essential (primary) hypertension: Secondary | ICD-10-CM | POA: Diagnosis not present

## 2012-08-24 DIAGNOSIS — I5031 Acute diastolic (congestive) heart failure: Secondary | ICD-10-CM | POA: Diagnosis not present

## 2012-08-24 DIAGNOSIS — I509 Heart failure, unspecified: Secondary | ICD-10-CM | POA: Diagnosis not present

## 2012-08-28 DIAGNOSIS — I509 Heart failure, unspecified: Secondary | ICD-10-CM | POA: Diagnosis not present

## 2012-08-28 DIAGNOSIS — I5031 Acute diastolic (congestive) heart failure: Secondary | ICD-10-CM | POA: Diagnosis not present

## 2012-08-28 DIAGNOSIS — L97909 Non-pressure chronic ulcer of unspecified part of unspecified lower leg with unspecified severity: Secondary | ICD-10-CM | POA: Diagnosis not present

## 2012-08-28 DIAGNOSIS — I872 Venous insufficiency (chronic) (peripheral): Secondary | ICD-10-CM | POA: Diagnosis not present

## 2012-08-28 DIAGNOSIS — E109 Type 1 diabetes mellitus without complications: Secondary | ICD-10-CM | POA: Diagnosis not present

## 2012-08-28 DIAGNOSIS — I10 Essential (primary) hypertension: Secondary | ICD-10-CM | POA: Diagnosis not present

## 2012-08-30 ENCOUNTER — Ambulatory Visit (INDEPENDENT_AMBULATORY_CARE_PROVIDER_SITE_OTHER): Payer: Medicare Other | Admitting: Pulmonary Disease

## 2012-08-30 ENCOUNTER — Encounter: Payer: Self-pay | Admitting: Pulmonary Disease

## 2012-08-30 VITALS — BP 124/64 | HR 71 | Temp 97.9°F | Ht 67.0 in | Wt 284.6 lb

## 2012-08-30 DIAGNOSIS — G4730 Sleep apnea, unspecified: Secondary | ICD-10-CM | POA: Diagnosis not present

## 2012-08-30 NOTE — Progress Notes (Signed)
  Subjective:    Patient ID: Melissa Myers, female    DOB: [birth year], 67 y.o.   MRN: [id number]  HPI  PCP - Felicity Coyer   67/F never smoker, diabetic , hypertensive for FU of dyspnea & OSA.  She reports dyspnea on exertion x 2 year . She reprots pedal edema x few weeks & sleeps in a recliner ? orthopnea. Mother has noted loud snoring but no witnessed apneas or choking/ gasping episodes. She uses an albuterol inhaler as needed & reports wheezing x last 3 years , no childhood h/o asthma.  She has restless legs x 10 years & requip makes her sleepy. Epworth Sleepiness Score is reported as 24 (but she states this is due to requip). bedtime is midnight, sleep latenyc is variable & sometimes she takes 5 mg ambien (1/2 tab)  BNP 23, nml renal function, TSH nml  CXR 2/28 wnl   02/07/2011 FU  Persistent dyspnea  Gained from 244 to 257 in 2 weeks  echo in 3/11 showing moderate LVH and preserved LV systolic function. However, there was a very large LV mid-cavity gradient with valsalva. Patient developed quite significant exertional dyspnea to the point where she was short of breath walking around her house. ETT-myoview was negative for ischemia or infarction. Right heart cath showed mildly elevated right heart filling pressures but normal PA pressure and normal PCWP. She was started on torsemide to replace Lasix, on high doses BUN cr rose to 83/3.0 , hence cut down to 50 mg .Repeat echo in 11/12 with normal EF. No significant LV mid-cavity gradient was described. V/Q scan was negative for PE 11/12  Reviewed PSG (on requip & ambien)>> mild obstructive sleep apnea , predominnat hypopneas in supine position, lowest destauration 80%, < 1 min with satn < 88%. TST 350 mins, 39 mins of REM. No PLMs   08/30/2012 Download from jan 2013 shows poor usage - she was apparently having panic attacks Feb >> pressure too much  pt had sleep study 08/06/12. Pt has CPAP but has not used it x last week. She sleep with  her O2 everynight.   There were 9 obstructive apneas, 0 central apnea, 0  mixed apneas, and 31 hypopneas with an apnea-hypopnea index of 6 events per hour. Few RERAs were noted with an RDI of 12 events per hour. The longest hypopnea was 45 seconds. Desaturation index was 3 events per hour. The lowest  desaturation was 87%. Note that this study was performed with 2 L of oxygen.   Review of Systems neg for any significant sore throat, dysphagia, itching, sneezing, nasal congestion or excess/ purulent secretions, fever, chills, sweats, unintended wt loss, pleuritic or exertional cp, hempoptysis, orthopnea pnd or change in chronic leg swelling. Also denies presyncope, palpitations, heartburn, abdominal pain, nausea, vomiting, diarrhea or change in bowel or urinary habits, dysuria,hematuria, rash, arthralgias, visual complaints, headache, numbness weakness or ataxia.     Objective:   Physical Exam   Gen. Pleasant, obese, in no distress, normal affect ENT - no lesions, no post nasal drip, class 2-3 airway Neck: No JVD, no thyromegaly, no carotid bruits Lungs: no use of accessory muscles, no dullness to percussion, decreased without rales or rhonchi  Cardiovascular: Rhythm regular, heart sounds  normal, no murmurs or gallops, 2+ peripheral edema Abdomen: soft and non-tender, no hepatosplenomegaly, BS normal. Musculoskeletal: No deformities, no cyanosis or clubbing Neuro:  alert, non focal, no tremors        Assessment & Plan:

## 2012-08-30 NOTE — Patient Instructions (Addendum)
We will decrease the pressure on your CPAP Trial of CPAP x 1 month - If you are not able to use > 4h every night, we will discontinue & just continue 2L Oxygen during sleep

## 2012-08-30 NOTE — Assessment & Plan Note (Signed)
Weight loss encouraged, compliance with goal of at least 4-6 hrs every night is the expectation. Advised against medications with sedative side effects Cautioned against driving when sleepy - understanding that sleepiness will vary on a day to day basis  We will decrease the pressure on your CPAP Trial of CPAP x 1 month - If you are not able to use > 4h every night, we will discontinue & just continue 2L Oxygen during sleep

## 2012-08-31 DIAGNOSIS — I872 Venous insufficiency (chronic) (peripheral): Secondary | ICD-10-CM | POA: Diagnosis not present

## 2012-08-31 DIAGNOSIS — E109 Type 1 diabetes mellitus without complications: Secondary | ICD-10-CM | POA: Diagnosis not present

## 2012-08-31 DIAGNOSIS — I5031 Acute diastolic (congestive) heart failure: Secondary | ICD-10-CM | POA: Diagnosis not present

## 2012-08-31 DIAGNOSIS — L97909 Non-pressure chronic ulcer of unspecified part of unspecified lower leg with unspecified severity: Secondary | ICD-10-CM | POA: Diagnosis not present

## 2012-08-31 DIAGNOSIS — I509 Heart failure, unspecified: Secondary | ICD-10-CM | POA: Diagnosis not present

## 2012-08-31 DIAGNOSIS — I10 Essential (primary) hypertension: Secondary | ICD-10-CM | POA: Diagnosis not present

## 2012-09-03 ENCOUNTER — Ambulatory Visit: Payer: Medicare Other | Admitting: Internal Medicine

## 2012-09-03 ENCOUNTER — Ambulatory Visit (HOSPITAL_COMMUNITY): Payer: Medicare Other

## 2012-09-04 DIAGNOSIS — L97909 Non-pressure chronic ulcer of unspecified part of unspecified lower leg with unspecified severity: Secondary | ICD-10-CM | POA: Diagnosis not present

## 2012-09-04 DIAGNOSIS — I872 Venous insufficiency (chronic) (peripheral): Secondary | ICD-10-CM | POA: Diagnosis not present

## 2012-09-04 DIAGNOSIS — E109 Type 1 diabetes mellitus without complications: Secondary | ICD-10-CM | POA: Diagnosis not present

## 2012-09-04 DIAGNOSIS — I10 Essential (primary) hypertension: Secondary | ICD-10-CM | POA: Diagnosis not present

## 2012-09-04 DIAGNOSIS — I5031 Acute diastolic (congestive) heart failure: Secondary | ICD-10-CM | POA: Diagnosis not present

## 2012-09-04 DIAGNOSIS — I509 Heart failure, unspecified: Secondary | ICD-10-CM | POA: Diagnosis not present

## 2012-09-05 DIAGNOSIS — I509 Heart failure, unspecified: Secondary | ICD-10-CM | POA: Diagnosis not present

## 2012-09-05 DIAGNOSIS — I872 Venous insufficiency (chronic) (peripheral): Secondary | ICD-10-CM | POA: Diagnosis not present

## 2012-09-05 DIAGNOSIS — I5031 Acute diastolic (congestive) heart failure: Secondary | ICD-10-CM | POA: Diagnosis not present

## 2012-09-05 DIAGNOSIS — L97909 Non-pressure chronic ulcer of unspecified part of unspecified lower leg with unspecified severity: Secondary | ICD-10-CM | POA: Diagnosis not present

## 2012-09-05 DIAGNOSIS — E109 Type 1 diabetes mellitus without complications: Secondary | ICD-10-CM | POA: Diagnosis not present

## 2012-09-05 DIAGNOSIS — I10 Essential (primary) hypertension: Secondary | ICD-10-CM | POA: Diagnosis not present

## 2012-09-06 ENCOUNTER — Encounter: Payer: Self-pay | Admitting: Internal Medicine

## 2012-09-06 ENCOUNTER — Other Ambulatory Visit (INDEPENDENT_AMBULATORY_CARE_PROVIDER_SITE_OTHER): Payer: Medicare Other

## 2012-09-06 ENCOUNTER — Ambulatory Visit (INDEPENDENT_AMBULATORY_CARE_PROVIDER_SITE_OTHER): Payer: Medicare Other | Admitting: Internal Medicine

## 2012-09-06 VITALS — BP 122/62 | HR 64 | Temp 98.0°F | Wt 290.1 lb

## 2012-09-06 DIAGNOSIS — G609 Hereditary and idiopathic neuropathy, unspecified: Secondary | ICD-10-CM

## 2012-09-06 DIAGNOSIS — I5032 Chronic diastolic (congestive) heart failure: Secondary | ICD-10-CM

## 2012-09-06 DIAGNOSIS — L97909 Non-pressure chronic ulcer of unspecified part of unspecified lower leg with unspecified severity: Secondary | ICD-10-CM | POA: Diagnosis not present

## 2012-09-06 DIAGNOSIS — E109 Type 1 diabetes mellitus without complications: Secondary | ICD-10-CM | POA: Diagnosis not present

## 2012-09-06 DIAGNOSIS — I872 Venous insufficiency (chronic) (peripheral): Secondary | ICD-10-CM | POA: Diagnosis not present

## 2012-09-06 DIAGNOSIS — L03119 Cellulitis of unspecified part of limb: Secondary | ICD-10-CM

## 2012-09-06 DIAGNOSIS — I10 Essential (primary) hypertension: Secondary | ICD-10-CM | POA: Diagnosis not present

## 2012-09-06 DIAGNOSIS — G2581 Restless legs syndrome: Secondary | ICD-10-CM | POA: Diagnosis not present

## 2012-09-06 DIAGNOSIS — G4733 Obstructive sleep apnea (adult) (pediatric): Secondary | ICD-10-CM | POA: Diagnosis not present

## 2012-09-06 DIAGNOSIS — I5031 Acute diastolic (congestive) heart failure: Secondary | ICD-10-CM | POA: Diagnosis not present

## 2012-09-06 DIAGNOSIS — L02419 Cutaneous abscess of limb, unspecified: Secondary | ICD-10-CM | POA: Diagnosis not present

## 2012-09-06 DIAGNOSIS — I509 Heart failure, unspecified: Secondary | ICD-10-CM | POA: Diagnosis not present

## 2012-09-06 DIAGNOSIS — I8310 Varicose veins of unspecified lower extremity with inflammation: Secondary | ICD-10-CM

## 2012-09-06 DIAGNOSIS — L03115 Cellulitis of right lower limb: Secondary | ICD-10-CM

## 2012-09-06 LAB — BASIC METABOLIC PANEL
BUN: 33 mg/dL — ABNORMAL HIGH (ref 6–23)
CO2: 36 mEq/L — ABNORMAL HIGH (ref 19–32)
Calcium: 9.8 mg/dL (ref 8.4–10.5)
Chloride: 99 mEq/L (ref 96–112)
Creatinine, Ser: 1 mg/dL (ref 0.4–1.2)
Glucose, Bld: 133 mg/dL — ABNORMAL HIGH (ref 70–99)

## 2012-09-06 MED ORDER — METOLAZONE 5 MG PO TABS: 5.0000 mg | ORAL_TABLET | ORAL | Status: DC

## 2012-09-06 MED ORDER — OXYCODONE-ACETAMINOPHEN 5-325 MG PO TABS: 1.0000 | ORAL_TABLET | Freq: Four times a day (QID) | ORAL | Status: DC | PRN

## 2012-09-06 MED ORDER — CEPHALEXIN 500 MG PO CAPS: 500.0000 mg | ORAL_CAPSULE | Freq: Three times a day (TID) | ORAL | Status: DC

## 2012-09-06 NOTE — Assessment & Plan Note (Signed)
Neuropathic leg pain - severe neuropathy per reports from [facility] - continue gabapentin TID - intol of prior trial lyrica 50 05/2012 due to side effects Ok to continue oxy prn

## 2012-09-06 NOTE — Progress Notes (Signed)
Subjective:    Patient ID: Melissa Myers, female    DOB: 03-Apr-1945, 67 y.o.   MRN: [id number]  HPI   Here for follow up - reviewed chronic medical issues and interval history  Complains of increasing edema and increasing weight Reports compliance with medication since prescribed, only on Zaroxolyn for 4 weeks "which did nothing" Also increasing redness and pain on right medial ankle   Past Medical History  Diagnosis Date  . Type II or unspecified type diabetes mellitus without mention of complication, not stated as uncontrolled     insulin dep  . Hyperlipidemia     hx rhabdo on statins  . Hypertension   . CHF (congestive heart failure)     diastolic  . OSA (obstructive sleep apnea)     insignificant apnea events 05/2009 sleep study  . Chronic diarrhea     a/w nausea - felt related to IBS  . RLS (restless legs syndrome)   . Osteoarthritis   . Stasis dermatitis   . Shortness of breath   . GERD (gastroesophageal reflux disease)   . Anemia      Review of Systems  Constitutional: Positive for fatigue. Negative for fever and unexpected weight change.  Respiratory: Positive for shortness of breath (chronic). Negative for cough, chest tightness and wheezing.   Cardiovascular: Negative for chest pain and palpitations. Leg swelling: chronic.  Musculoskeletal: Positive for back pain.  Skin: Positive for rash. Negative for wound.       Objective:   Physical Exam  BP 122/62  Pulse 64  Temp(Src) 98 F (36.7 C) (Oral)  Wt 290 lb 1.9 oz (131.598 kg)  BMI 45.43 kg/m2  SpO2 91% Wt Readings from Last 3 Encounters:  09/06/12 290 lb 1.9 oz (131.598 kg)  08/30/12 284 lb 9.6 oz (129.094 kg)  08/06/12 272 lb (123.378 kg)   Constitutional: She is overweight; appears well-developed and well-nourished. No distress. mom at side Neck: Thick; Normal range of motion. Neck supple. No JVD present. No thyromegaly present.  Cardiovascular: Normal rate, regular rhythm and normal heart  sounds.  No murmur heard. chronic tight/woody changes with 1+ BLE edema Pulmonary/Chest: Effort normal and breath sounds diminished at bases bilaterally. No respiratory distress. She has no wheezes.  Skin: chronic thickening and min erythema of distal BLE. Bilateral stasis dermatitis without change - resolution of pustules on anterior shin - RLE distal leg with localized redness over medial malleolus associated with pustules and weeping but no current ulceration, LLE distally with cracking but no ulceration. Psychiatric: She has a dysthymic mood and affect. Her behavior is normal. Judgment and thought content normal.       Lab Results  Component Value Date   WBC 8.4 07/26/2012   HGB 11.6* 07/26/2012   HCT 36.3 07/26/2012   PLT 282.0 07/26/2012   CHOL 221* 08/03/2012   TRIG 188.0* 08/03/2012   HDL 54.80 08/03/2012   LDLDIRECT 127.5 08/03/2012   ALT 19 07/11/2012   AST 26 07/11/2012   NA 138 08/17/2012   K 3.7 08/17/2012   CL 92* 08/17/2012   CREATININE 0.9 08/17/2012   BUN 29* 08/17/2012   CO2 32 08/17/2012   TSH 1.59 07/24/2012   INR 1.10 07/11/2012   HGBA1C 9.3* 07/24/2012    Assessment & Plan:   See problem list. Medications and labs reviewed today.  Cellulitis, right medial ankle. Exacerbated by stasis dermatitis and chronic venous insufficiency. Increased diuretics as below. Keflex 3 times a day x1 week. Continue pain management  as ongoing, refill oxy provided

## 2012-09-06 NOTE — Assessment & Plan Note (Signed)
Following with cardiology for same, volume status complicated by renal insufficiency and chronic venous insufficiency Remains on loop diuretics (torsemide more effective than furosemide)-  brief trial low dose zaroxolyn 08/04/12 x 4 weeks "didnothing" - weight gain and edema reviewed Check BMet and BNP now -  Resume zaroxolyn at 5mg  2x/week - Sat and Wed AM Cards changed amlodipine to hydralazine s/p RHC 04/06/11 Per cardiology, also on Coreg to decrease of the mid cavity gradient Medications and interval hx reviewed, reminded of importance of diuretic, diet and other med compliance

## 2012-09-06 NOTE — Patient Instructions (Signed)
It was good to see you today. We have reviewed your prior records including labs and tests today Test(s) ordered today. Your results will be released to MyChart (or called to you) after review, usually within 72hours after test completion. If any changes need to be made, you will be notified at that same time. Medications reviewed and updated, resume Zaroxolyn 1 tab 2x/week on Wed and Sat, also antibiotics 3x/day x 1 week for skin infection Your prescription(s) have been submitted to your pharmacy. Please take as directed and contact our office if you believe you are having problem(s) with the medication(s). Please schedule followup in 6 weeks, call sooner if problems.

## 2012-09-06 NOTE — Assessment & Plan Note (Signed)
Complicated by severe lymphedema - high dose loop diuretic and resuming 2x/week zaroxyln Continue high dose diuretic - has been seen at [facility] and [facility] Dx confirmed from [facility] derm 2nd opinion 08/2011 - now longer following with wound clinic refuses to comply with [facility] for una boots and xeroform -even despite hx open ulcers 2-3x/wk -  Early infection R medial ankle - resume antibiotics

## 2012-09-10 ENCOUNTER — Telehealth: Payer: Self-pay | Admitting: *Deleted

## 2012-09-10 MED ORDER — DOXYCYCLINE HYCLATE 100 MG PO TABS: 100.0000 mg | ORAL_TABLET | Freq: Two times a day (BID) | ORAL | Status: AC

## 2012-09-10 NOTE — Telephone Encounter (Signed)
Stop keflex - take doxy - erx done

## 2012-09-10 NOTE — Telephone Encounter (Signed)
Pt called states she is getting worse.  States her legs are breaking out, she is requesting previous antibiotic to be written states it was more effective than the current one.

## 2012-09-11 NOTE — Telephone Encounter (Signed)
Spoke with pt advised of MDs order 

## 2012-09-17 ENCOUNTER — Telehealth: Payer: Self-pay | Admitting: Pulmonary Disease

## 2012-09-17 DIAGNOSIS — G4730 Sleep apnea, unspecified: Secondary | ICD-10-CM

## 2012-09-17 NOTE — Telephone Encounter (Signed)
Download on auto 4-20>> large leak, 6 cm ok She is only using avg 2h - she has to use >4h, if not CPAP will be taken away Pl send Rx to change to auto4-10 cm, download & FU in 4 wks to discuss

## 2012-09-18 NOTE — Telephone Encounter (Signed)
LMTCB x1 for pt.  

## 2012-09-19 NOTE — Telephone Encounter (Signed)
Pt aware of results. appt scheduled. Order sent.

## 2012-09-20 ENCOUNTER — Encounter: Payer: Self-pay | Admitting: Internal Medicine

## 2012-09-20 ENCOUNTER — Ambulatory Visit (INDEPENDENT_AMBULATORY_CARE_PROVIDER_SITE_OTHER): Payer: Medicare Other | Admitting: Internal Medicine

## 2012-09-20 ENCOUNTER — Other Ambulatory Visit (INDEPENDENT_AMBULATORY_CARE_PROVIDER_SITE_OTHER): Payer: Medicare Other

## 2012-09-20 VITALS — BP 118/72 | HR 63 | Temp 97.9°F | Wt 290.6 lb

## 2012-09-20 DIAGNOSIS — K921 Melena: Secondary | ICD-10-CM | POA: Diagnosis not present

## 2012-09-20 DIAGNOSIS — D649 Anemia, unspecified: Secondary | ICD-10-CM

## 2012-09-20 DIAGNOSIS — I10 Essential (primary) hypertension: Secondary | ICD-10-CM

## 2012-09-20 LAB — CBC WITH DIFFERENTIAL/PLATELET
Basophils Relative: 0.6 % (ref 0.0–3.0)
Eosinophils Absolute: 0.3 10*3/uL (ref 0.0–0.7)
Eosinophils Relative: 3.1 % (ref 0.0–5.0)
HCT: 32.7 % — ABNORMAL LOW (ref 36.0–46.0)
Hemoglobin: 10.6 g/dL — ABNORMAL LOW (ref 12.0–15.0)
MCHC: 32.3 g/dL (ref 30.0–36.0)
MCV: 75.4 fl — ABNORMAL LOW (ref 78.0–100.0)
Monocytes Absolute: 0.7 10*3/uL (ref 0.1–1.0)
Neutro Abs: 6.1 10*3/uL (ref 1.4–7.7)
Neutrophils Relative %: 64.3 % (ref 43.0–77.0)
RBC: 4.34 Mil/uL (ref 3.87–5.11)
WBC: 9.5 10*3/uL (ref 4.5–10.5)

## 2012-09-20 LAB — IBC PANEL
Saturation Ratios: 4.5 % — ABNORMAL LOW (ref 20.0–50.0)
Transferrin: 480.8 mg/dL — ABNORMAL HIGH (ref 212.0–360.0)

## 2012-09-20 MED ORDER — HYDROCORTISONE ACETATE 25 MG RE SUPP: 25.0000 mg | Freq: Two times a day (BID) | RECTAL | Status: DC

## 2012-09-20 NOTE — Progress Notes (Signed)
Subjective:    Patient ID: Melissa Myers, female    DOB: [birth year], 67 y.o.   MRN: [id number]  HPI  Here to f/u, c/o episode small BRBPR last evening,  Denies worsening reflux, abd pain, dysphagia, n/v, bowel change or other blood or bleeding.   Refuses colonscopy.  Last colon approx 10 yrs ago, just does not want to do now, might consider later. No orthostasis.  Pt denies chest pain, increased sob or doe, wheezing, orthopnea, PND, increased LE swelling, palpitations, dizziness or syncope.   Pt denies polydipsia, polyuria.  Past Medical History  Diagnosis Date  . Type II or unspecified type diabetes mellitus without mention of complication, not stated as uncontrolled     insulin dep  . Hyperlipidemia     hx rhabdo on statins  . Hypertension   . CHF (congestive heart failure)     diastolic  . OSA (obstructive sleep apnea)     insignificant apnea events 05/2009 sleep study  . Chronic diarrhea     a/w nausea - felt related to IBS  . RLS (restless legs syndrome)   . Osteoarthritis   . Stasis dermatitis   . Shortness of breath   . GERD (gastroesophageal reflux disease)   . Anemia    Past Surgical History  Procedure Laterality Date  . Tubal ligation  1980  . Cholecystectomy  1997  . Tonsillectomy  1970  . Uterine polyp removal  2008  . Umbilical hernia repair  1995    reports that she has never smoked. She has never used smokeless tobacco. She reports that she does not drink alcohol or use illicit drugs. family history includes Cancer in her other; Heart disease in her other; Heart disease (age of onset: 60) in her mother; and Heart disease (age of onset: 65) in her father. Allergies  Allergen Reactions  . Levemir (Insulin Detemir)     itching  . Morphine     REACTION: GI upset and headaches  . Sulfa Antibiotics Diarrhea  . Sulfonamide Derivatives   . Zinc Swelling and Rash   Current Outpatient Prescriptions on File Prior to Visit  Medication Sig Dispense Refill  .  aspirin 81 MG tablet Take 81 mg by mouth daily.       . B-D INS SYR ULTRAFINE 1CC/31G 31G X 5/16" 1 ML MISC USE AS DIRECTED FOUR TIMES DAILY  120 each  prn  . carvedilol (COREG) 25 MG tablet Take 1 tablet (25 mg total) by mouth 2 (two) times daily with a meal. 1 and 1/2 tablets (total 37.5mg ) two times a day  90 tablet  3  . Coenzyme Q10 (COQ10) 30 MG CAPS Take 1 capsule by mouth daily.       . fenofibrate 160 MG tablet Take 1 tablet (160 mg total) by mouth daily.  30 tablet  5  . gabapentin (NEURONTIN) 300 MG capsule Take 1 capsule (300 mg total) by mouth 3 (three) times daily.      . hydrALAZINE (APRESOLINE) 50 MG tablet Take 1 tablet (50 mg total) by mouth 3 (three) times daily.  90 tablet  6  . insulin glargine (LANTUS) 100 UNIT/ML injection Inject 44 Units into the skin at bedtime.      . insulin glulisine (APIDRA) 100 UNIT/ML injection Inject 35 Units into the skin 3 (three) times daily before meals.       . Magnesium 200 MG TABS Take 1 tablet by mouth 2 (two) times daily. 1 tablet two times a  day  60 each    . metolazone (ZAROXOLYN) 5 MG tablet Take 1 tablet (5 mg total) by mouth 2 (two) times a week.  8 tablet  2  . omega-3 acid ethyl esters (LOVAZA) 1 G capsule TAKE 2 CAPSULES BY MOUTH TWICE DAILY  120 capsule  2  . ONE TOUCH ULTRA TEST test strip USE TO CHECK BLOOD GLUCOSE TWICE DAILY  100 each  0  . oxyCODONE-acetaminophen (PERCOCET/ROXICET) 5-325 MG per tablet Take 1 tablet by mouth every 6 (six) hours as needed. for pain  30 tablet  0  . potassium chloride (K-DUR) 10 MEQ tablet Take 30 mEq by mouth 2 (two) times daily. 3 tablets (total 30 mEq) two times a day      . rOPINIRole (REQUIP) 2 MG tablet Take 2 mg by mouth 2 (two) times daily.      [name] torsemide (DEMADEX) 20 MG tablet Take 3 of your 20mg  tablets (total 60mg ) two times a day  180 tablet  6   No current facility-administered medications on file prior to visit.    Review of Systems  Constitutional: Negative for unexpected  weight change, or unusual diaphoresis  HENT: Negative for tinnitus.   Eyes: Negative for photophobia and visual disturbance.  Respiratory: Negative for choking and stridor.   Gastrointestinal: Negative for vomiting and blood in stool.  Genitourinary: Negative for hematuria and decreased urine volume.  Musculoskeletal: Negative for acute joint swelling Skin: Negative for color change and wound.  Neurological: Negative for tremors and numbness other than noted  Psychiatric/Behavioral: Negative for decreased concentration or  hyperactivity.       Objective:   Physical Exam BP 118/72  Pulse 63  Temp(Src) 97.9 F (36.6 C) (Oral)  Wt 290 lb 9 oz (131.798 kg)  BMI 45.5 kg/m2  SpO2 97% VS noted, seated in wheelchair Constitutional: Pt appears well-developed and well-nourished. /morbid obesity HENT: Head: NCAT.  Right Ear: External ear normal.  Left Ear: External ear normal.  Eyes: Conjunctivae and EOM are normal. Pupils are equal, round, and reactive to light.  Neck: Normal range of motion. Neck supple.  Cardiovascular: Normal rate and regular rhythm.   Pulmonary/Chest: Effort normal and breath sounds  - no rales or wheezing Abd:  Soft, NT, non-distended, + BS Neurological: Pt is alert. Not confused  Skin: Skin is warm. No erythema. Chronic 2-3+ LE edema Psychiatric: Pt behavior is normal. Thought content normal. 1+ nervous    Assessment & Plan:

## 2012-09-20 NOTE — Patient Instructions (Signed)
Please take all new medication as prescribed Please continue all other medications as before, and refills have been done if requested. Please keep your appointments with your specialists as you have planned Please go to the LAB in the Basement (turn left off the elevator) for the tests to be done today You will be contacted by phone if any changes need to be made immediately.  Otherwise, you will receive a letter about your results with an explanation, but please check with MyChart first.  Please remember to sign up for My Chart if you have not done so, as this will be important to you in the future with finding out test results, communicating by private email, and scheduling acute appointments online when needed.  Please call if you change your mind about the colonoscopy  Please go to ER if bleeding recurs and does not seem to stop, or get more dizzy, falling or pain

## 2012-09-21 ENCOUNTER — Encounter: Payer: Medicare Other | Admitting: Internal Medicine

## 2012-09-21 ENCOUNTER — Encounter: Payer: Self-pay | Admitting: Internal Medicine

## 2012-09-21 ENCOUNTER — Other Ambulatory Visit: Payer: Self-pay | Admitting: Internal Medicine

## 2012-09-21 DIAGNOSIS — K921 Melena: Secondary | ICD-10-CM

## 2012-09-21 DIAGNOSIS — D509 Iron deficiency anemia, unspecified: Secondary | ICD-10-CM

## 2012-09-23 NOTE — Assessment & Plan Note (Signed)
stable overall by history and exam, recent data reviewed with pt, and pt to continue medical treatment as before,  to f/u any worsening symptoms or concerns BP Readings from Last 3 Encounters:  09/20/12 118/72  09/06/12 122/62  08/30/12 124/64

## 2012-09-23 NOTE — Assessment & Plan Note (Signed)
Small volume, o/ asympt, diff includes tic bleeding vs heamorrhoidal, refuses GI referal or colonoscopy, for lab today, anusol HC trial, to ER if recurs

## 2012-09-23 NOTE — Assessment & Plan Note (Signed)
For f/u lab today,  to f/u any worsening symptoms or concerns 

## 2012-09-26 ENCOUNTER — Telehealth: Payer: Self-pay | Admitting: Internal Medicine

## 2012-09-26 ENCOUNTER — Encounter: Payer: Self-pay | Admitting: Internal Medicine

## 2012-09-26 NOTE — Telephone Encounter (Signed)
Spoke with patient and she would like to switch from Dr. Leone Payor to Dr. Juanda Chance. Is this ok with you Dr. Leone Payor?

## 2012-09-26 NOTE — Telephone Encounter (Signed)
Dr. Juanda Chance, Patient wants to switch from Dr. Leone Payor to you. Is this ok?

## 2012-09-26 NOTE — Telephone Encounter (Signed)
Sorry if she thought I was rude  By all means she should have another provider

## 2012-09-26 NOTE — Telephone Encounter (Signed)
Called pt and sch'd a colonoscopy for 11-28-12 w/Dr. Juanda Chance. Mailed Pt the paperwork

## 2012-09-26 NOTE — Telephone Encounter (Signed)
OK , DB

## 2012-09-27 ENCOUNTER — Ambulatory Visit (HOSPITAL_COMMUNITY)
Admission: RE | Admit: 2012-09-27 | Discharge: 2012-09-27 | Disposition: A | Discharge status: Home / Self Care | Payer: Medicare Other | Source: Ambulatory Visit | Attending: Internal Medicine | Admitting: Internal Medicine

## 2012-09-27 ENCOUNTER — Encounter (HOSPITAL_COMMUNITY): Payer: Self-pay

## 2012-09-27 VITALS — BP 120/60 | HR 60 | Wt 285.1 lb

## 2012-09-27 DIAGNOSIS — G4730 Sleep apnea, unspecified: Secondary | ICD-10-CM

## 2012-09-27 DIAGNOSIS — I5042 Chronic combined systolic (congestive) and diastolic (congestive) heart failure: Secondary | ICD-10-CM | POA: Insufficient documentation

## 2012-09-27 DIAGNOSIS — I5022 Chronic systolic (congestive) heart failure: Secondary | ICD-10-CM

## 2012-09-27 DIAGNOSIS — R0602 Shortness of breath: Secondary | ICD-10-CM | POA: Insufficient documentation

## 2012-09-27 DIAGNOSIS — I5032 Chronic diastolic (congestive) heart failure: Secondary | ICD-10-CM | POA: Diagnosis not present

## 2012-09-27 DIAGNOSIS — E669 Obesity, unspecified: Secondary | ICD-10-CM | POA: Insufficient documentation

## 2012-09-27 DIAGNOSIS — G4733 Obstructive sleep apnea (adult) (pediatric): Secondary | ICD-10-CM | POA: Insufficient documentation

## 2012-09-27 MED ORDER — TORSEMIDE 20 MG PO TABS: ORAL_TABLET | ORAL | Status: DC

## 2012-09-27 MED ORDER — METOLAZONE 5 MG PO TABS: ORAL_TABLET | ORAL | Status: DC

## 2012-09-27 NOTE — Patient Instructions (Addendum)
Take torsemide 80 mg twice a day  Take Metolazone 5 mg today    Do the following things EVERYDAY: 1) Weigh yourself in the morning before breakfast. Write it down and keep it in a log. 2) Take your medicines as prescribed 3) Eat low salt foods-Limit salt (sodium) to 2000 mg per day.  4) Stay as active as you can everyday 5) Limit all fluids for the day to less than 2 liters  Follow up in 3 weeks with Dr Shirlee Latch

## 2012-09-29 NOTE — Progress Notes (Signed)
Patient ID: Melissa Myers, female   DOB: 11/17/1945, 67 y.o.   MRN: [id number] PCP: Dr. Felicity Coyer  67 yo with history of HTN, DM, hyperlipidemia, obesity, and chronic dyspnea/diastolic CHF returns for cardiac evaluation. Patient had an echo in 3/11 showing moderate LVH and preserved LV systolic function. However, there was a very large LV mid-cavity gradient with valsalva.  Patient developed quite significant exertional dyspnea to the point where she was short of breath walking around her house. She had a pulmonary evaluation with Dr. Vassie Loll but no primary lung problems were identified. Patient had an ETT-myoview in 4/11 which was negative for ischemia or infarction. Right heart cath in 4/11 showed mildly elevated right heart filling pressures but normal PA pressure and normal PCWP. I started her on a beta blocker (Coreg) to try to lower her LV mid-cavity gradient (that likely occurs with exertion) and to better control BP.  V/Q scan was negative for PE.  PFTs from 5/13 showed a restrictive defect. She had a recent repeat echo in 4/14 with normal EF, mild mid-cavity LV gradient.     08/05/12 Sleep study - recommendations for CPAP or oral devices.   She returns for follow up.  Complains of mild dyspnea with exertion. + Orthopnea sleeps on 2 pillows. She only weighs once a week. Weight home 286 pounds. She does not take additional torsemide. Intolerant CPAP. Uses oxygen as needed at home. Tries to follow low salt diet. Tries to limit fluid to < 2 liters per day but likes to eat ice. Complaint with medications but she did not take her torsemide this am. [facility] following.    Labs (4/11): TGs 539, HDL 58, LDL 128, K 5.2, creatinine 1.3, TSH normal, BNP 14  Labs (5/11): K 3.9, creatinine 1.0  Labs (9/11): creatinine 1.1, BUN 43, LDL 145, HDL 60  Labs (10/11): K 4.3, creatinine 0.81, HCT 34, LDL 115, HDL 75  Labs (11/12): K 4.2, creatinine 1.58 => 3.0, HCT 32.2, BNP 172, LDL 97, TGs 980 Labs (12/12): K 3.4,  creatinine 1.2 => 1.7 Labs (1/13): K 3.9, creatinine 1.1, TGs 281, LDL 121 Labs (2/13): K 3.3, creatinine 1.2 Labs (10/13): K 4.1, creatinine 0.85 Labs (4/14): K 3.7, creatinine 1.2, BUN 43, BNP 23 Labs (5/14): K 4.1, creatinine 1.0 Labs 09/06/12 : K 4.6 Creatinine 1.0 BNP 39.0   Allergies (verified):  1) ! Sulfa  2) Morphine   Past Medical History:  1. Diabetes mellitus, type II  2. Hyperlipidemia: She has been unable to tolerate statins (has been on Vytorin, lovastatin, and Crestor) due to what sounds like rhabdo: developed muscle weakness and was told she had "muscle damage" with each of these statins. She has been told not to take statins anymore.  3. Hypertension: prev was told she had "white coat" HTN  4. Obesity  5. Restless Leg Syndrome since age 44yo  6. OA  7. peripheral neuropathy  8. OHS/OSA: Home oxygen during the day, CPAP at night.   9. Chronic nausea/diarrhea: ? IBS  10. Diastolic CHF: Echo (3/11) with normal LV size, moderate LVH, EF 65-70%, LV mid-cavity gradient reaching 63 mmHg with valsalva but minimal at rest, grade I diastolic dysfunction, cannot estimate PA systolic pressure (no TR doppler signal), RV normal. RHC (4/11): Mean RA 11, RV 37/13, PA 33/15, mean PCWP 12, CI 3.3. Echo in 11/12 showed mild LVH, EF 65-70%, no LV mid-cavity gradient was mentioned.  Echo in 4/14 showed EF 65-70%, mild LVH, grade I diastolic dysfunction, PA  systolic pressure 35 mmHg, mild LV mid-cavity gradient.  11. ETT-myoview (4/11): 5'30", stopped due to fatigue, normal EF, no evidence for scar or ischemia. 12. V/Q scan 11/12: negative for PE.  Lower extremity venous doppler US (3/14): negative for DVT.  13. PFTs (5/13): FVC 50%, FEV1 62%, ratio 106%, DLCO 69%, TLC 69%.   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   ROS: All systems reviewed and negative except as per HPI.   Current Outpatient Prescriptions  Medication Sig Dispense Refill  . aspirin 81 MG tablet Take 81 mg by mouth daily.       . B-D INS SYR ULTRAFINE 1CC/31G 31G X 5/16" 1 ML MISC USE AS DIRECTED FOUR TIMES DAILY  120 each  prn  . carvedilol (COREG) 25 MG tablet Take 1 tablet (25 mg total) by mouth 2 (two) times daily with a meal. 1 and 1/2 tablets (total 37.5mg ) two times a day  90 tablet  3  . Coenzyme Q10 (COQ10) 30 MG CAPS Take 1 capsule by mouth daily.       . fenofibrate 160 MG tablet Take 1 tablet (160 mg total) by mouth daily.  30 tablet  5  . gabapentin (NEURONTIN) 300 MG capsule Take 1 capsule (300 mg total) by mouth 3 (three) times daily.      . hydrALAZINE (APRESOLINE) 50 MG tablet Take 1 tablet (50 mg total) by mouth 3 (three) times daily.  90 tablet  6  . hydrocortisone (ANUSOL-HC) 25 MG suppository Place 1 suppository (25 mg total) rectally every 12 (twelve) hours.  12 suppository  1  . insulin glargine (LANTUS) 100 UNIT/ML injection Inject 44 Units into the skin at bedtime.      . insulin glulisine (APIDRA) 100 UNIT/ML injection Inject 35 Units into the skin 3 (three) times daily before meals.       . Magnesium 200 MG TABS Take 1 tablet by mouth 2 (two) times daily. 1 tablet two times a day  60 each    . metolazone (ZAROXOLYN) 5 MG tablet Take 5 mg 30 minutes prior to torsemide on Saturday and as needed  12 tablet  6  . omega-3 acid ethyl esters  (LOVAZA) 1 G capsule TAKE 2 CAPSULES BY MOUTH TWICE DAILY  120 capsule  2  . ONE TOUCH ULTRA TEST test strip USE TO CHECK BLOOD GLUCOSE TWICE DAILY  100 each  0  . oxyCODONE-acetaminophen (PERCOCET/ROXICET) 5-325 MG per tablet Take 1 tablet by mouth every 6 (six) hours as needed. for pain  30 tablet  0  . potassium chloride (K-DUR) 10 MEQ tablet Take 30 mEq by mouth 2 (two) times daily. 3 tablets (total 30 mEq) two times a day      . rOPINIRole (REQUIP) 2 MG tablet Take 2 mg by mouth 2 (two) times daily.      [name]  torsemide (DEMADEX) 20 MG tablet Take 4 of your 20mg  tablets (total 80mg ) two times a day  240 tablet  6   No current facility-administered medications for this encounter.    BP 120/60  Pulse 60  Wt 285 lb 1.9 oz (129.33 kg)  BMI 44.65 kg/m2  SpO2 94% General: NAD, obese Neck: Thick, JVP to jaw cm, no thyromegaly or thyroid nodule.  Lungs: Clear to auscultation bilaterally with normal respiratory effort. CV: Nondisplaced PMI.  Heart regular S1/S2, no S3/S4, 2/6 SEM RUSB.  Chronic edema to knees bilaterally.  No carotid bruit.  Abdomen: Soft, nontender, no hepatosplenomegaly, no distention.  Skin: Venous stasis changes lower legs bilaterally.    Neurologic: Alert and oriented x 3.  Psych: Normal affect. Extremities: R and LLE 3+ edema.    Assessment/Plan: 1. Chronic diastolic CHF:   She does still appear volume overloaded with elevated JVP.  Venous insufficiency almost certainly contributes to her lower leg edema. She also drinks a lot of fluid and eats ice.  - Continue Coreg as there is still a mid-cavity gradient present on echo (mild compared to past).   - Increase torsemide 80 mg twice a day and KCl at current doses. - Give metolazone 5 mg today and on Saturdays. Prior to am torsemide. - Counseled extensively on need for dietary compliance. - Check [facility] next week [facility].   2. Pulmonary: Suspect OHS/OSA.   -Intolerant CPAP.   - Continue home oxygen.   - She has pulmonary  followup (Dr. Vassie Loll).   3. Morbid obesity: Needs weight loss but very difficult given impaired mobility from arthritisand dietary habits  4. Renal: Reviewed last BMET 09/06/12   5. R and LLE venous insufficiency -Intolerant Unna Boots/Profore dressings. Ace wraps daily.   Follow up in 3 weeks with Dr Neale Burly Bensimhon,MD 5:46 PM

## 2012-09-30 ENCOUNTER — Other Ambulatory Visit: Payer: Self-pay | Admitting: Cardiology

## 2012-10-01 DIAGNOSIS — L97909 Non-pressure chronic ulcer of unspecified part of unspecified lower leg with unspecified severity: Secondary | ICD-10-CM

## 2012-10-01 DIAGNOSIS — I5031 Acute diastolic (congestive) heart failure: Secondary | ICD-10-CM

## 2012-10-01 DIAGNOSIS — I509 Heart failure, unspecified: Secondary | ICD-10-CM | POA: Diagnosis not present

## 2012-10-01 DIAGNOSIS — I872 Venous insufficiency (chronic) (peripheral): Secondary | ICD-10-CM

## 2012-10-05 ENCOUNTER — Other Ambulatory Visit: Payer: Self-pay | Admitting: Cardiology

## 2012-10-08 ENCOUNTER — Telehealth: Payer: Self-pay | Admitting: Pulmonary Disease

## 2012-10-08 DIAGNOSIS — G4733 Obstructive sleep apnea (adult) (pediatric): Secondary | ICD-10-CM

## 2012-10-08 NOTE — Telephone Encounter (Signed)
LMOMTCB x1 for pt to see what is going on with her machine.

## 2012-10-09 NOTE — Telephone Encounter (Signed)
lmtcb x2 

## 2012-10-10 NOTE — Telephone Encounter (Signed)
I spoke with pt. She stated she is not using the machine at all. When she uses the machine she takes it off during the night without knowing it. He stated she already can't sleep much at night and knows she can't use it 4 hrs a night. She stated she wants to turn this in bc she is being charged for something not using and also not being compliant. Please advise RA thanks

## 2012-10-10 NOTE — Telephone Encounter (Signed)
Pt returned call. Kathleen W Perdue  

## 2012-10-10 NOTE — Telephone Encounter (Signed)
Called, spoke with pt's husband. Was advised she is currently resting.  He will ask her to call office back.

## 2012-10-10 NOTE — Telephone Encounter (Signed)
OK to send dc order

## 2012-10-10 NOTE — Telephone Encounter (Signed)
Order sent and pt aware. Nothing further needed

## 2012-10-11 ENCOUNTER — Ambulatory Visit: Payer: Medicare Other | Admitting: Pulmonary Disease

## 2012-10-16 ENCOUNTER — Encounter: Payer: Self-pay | Admitting: Internal Medicine

## 2012-10-16 ENCOUNTER — Ambulatory Visit (INDEPENDENT_AMBULATORY_CARE_PROVIDER_SITE_OTHER): Payer: Medicare Other | Admitting: Internal Medicine

## 2012-10-16 VITALS — BP 128/62 | HR 63 | Temp 97.7°F | Wt 265.8 lb

## 2012-10-16 DIAGNOSIS — K921 Melena: Secondary | ICD-10-CM

## 2012-10-16 DIAGNOSIS — G2581 Restless legs syndrome: Secondary | ICD-10-CM

## 2012-10-16 DIAGNOSIS — G609 Hereditary and idiopathic neuropathy, unspecified: Secondary | ICD-10-CM

## 2012-10-16 MED ORDER — LORAZEPAM 0.5 MG PO TABS: 0.5000 mg | ORAL_TABLET | Freq: Two times a day (BID) | ORAL | Status: DC | PRN

## 2012-10-16 MED ORDER — GABAPENTIN 300 MG PO CAPS: 900.0000 mg | ORAL_CAPSULE | Freq: Three times a day (TID) | ORAL | Status: DC

## 2012-10-16 NOTE — Patient Instructions (Signed)
It was good to see you today. We have reviewed your prior records including labs and tests today Medications reviewed and updated, start lorazepam 2x/day as needed for leg pain and restless leg symptoms  Your prescription(s) have been submitted to your pharmacy. Please take as directed and contact our office if you believe you are having problem(s) with the medication(s). Please schedule followup in 3 months, call sooner if problems.

## 2012-10-16 NOTE — Assessment & Plan Note (Signed)
Neuropathic leg pain - severe neuropathy per reports from [facility] - continue gabapentin TID - intol of prior trial lyrica 50 05/2012 due to side effects Add dose BZ as helps neuropathy symptoms  (takes her mom's) - will give pt her own rx

## 2012-10-16 NOTE — Assessment & Plan Note (Signed)
On Requip for same for years -  Sleep study 05/2009 reviewed - no significant apnea events - s/p repeat study 08/15/12, but intol of CPAP trial (see phone note in EMR 09/2012) Working with Vassie Loll (pulm) on this and overlapping OSA Add BZ as for neuropathy - new rx done

## 2012-10-16 NOTE — Progress Notes (Signed)
  Subjective:    Patient ID: Melissa Myers, female    DOB: March 03, 1945, 67 y.o.   MRN: [id number]  HPI  Here for follow up - reviewed chronic medical issues and interval history   Past Medical History  Diagnosis Date  . Type II or unspecified type diabetes mellitus without mention of complication, not stated as uncontrolled     insulin dep  . Hyperlipidemia     hx rhabdo on statins  . Hypertension   . CHF (congestive heart failure)     diastolic  . OSA (obstructive sleep apnea)     insignificant apnea events 05/2009 sleep study  . Chronic diarrhea     a/w nausea - felt related to IBS  . RLS (restless legs syndrome)   . Osteoarthritis   . Stasis dermatitis   . Shortness of breath   . GERD (gastroesophageal reflux disease)   . Anemia      Review of Systems  Constitutional: Positive for fatigue. Negative for fever and unexpected weight change.  Respiratory: Positive for shortness of breath (chronic). Negative for cough, chest tightness and wheezing.   Cardiovascular: Negative for chest pain and palpitations. Leg swelling: chronic.  Musculoskeletal: Positive for back pain (chronic) and arthralgias (chronic).       Objective:   Physical Exam BP 128/62  Pulse 63  Temp(Src) 97.7 F (36.5 C) (Oral)  Wt 265 lb 12.8 oz (120.566 kg)  BMI 41.62 kg/m2  SpO2 94% Wt Readings from Last 3 Encounters:  10/16/12 265 lb 12.8 oz (120.566 kg)  09/27/12 285 lb 1.9 oz (129.33 kg)  09/20/12 290 lb 9 oz (131.798 kg)   Constitutional: She is overweight; appears well-developed and well-nourished. No distress. mom at side Neck: Thick; Normal range of motion. Neck supple. No JVD present. No thyromegaly present.  Cardiovascular: Normal rate, regular rhythm and normal heart sounds.  No murmur heard. chronic tight/woody changes with 1+ BLE edema Pulmonary/Chest: Effort normal and breath sounds diminished at bases bilaterally. No respiratory distress. She has no wheezes.  Skin: chronic  thickening and min erythema of distal BLE. Bilateral stasis dermatitis without change - resolution of pustules on anterior shin - RLE distal leg with localized redness over medial malleolus but no current ulceration, LLE distally with cracking but no ulceration. Psychiatric: She has a dysthymic mood and affect. Her behavior is normal. Judgment and thought content normal.       Lab Results  Component Value Date   WBC 9.5 09/20/2012   HGB 10.6* 09/20/2012   HCT 32.7* 09/20/2012   PLT 307.0 09/20/2012   CHOL 221* 08/03/2012   TRIG 188.0* 08/03/2012   HDL 54.80 08/03/2012   LDLDIRECT 127.5 08/03/2012   ALT 19 07/11/2012   AST 26 07/11/2012   NA 142 09/06/2012   K 4.6 09/06/2012   CL 99 09/06/2012   CREATININE 1.0 09/06/2012   BUN 33* 09/06/2012   CO2 36* 09/06/2012   TSH 1.59 07/24/2012   INR 1.10 07/11/2012   HGBA1C 9.3* 07/24/2012    Assessment & Plan:   See problem list. Medications and labs reviewed today.

## 2012-10-16 NOTE — Assessment & Plan Note (Signed)
Small volume with BM 08/2012  follow up colo planned with GI 11/2012

## 2012-10-17 ENCOUNTER — Telehealth: Payer: Self-pay | Admitting: Internal Medicine

## 2012-10-17 DIAGNOSIS — N390 Urinary tract infection, site not specified: Secondary | ICD-10-CM | POA: Diagnosis not present

## 2012-10-17 NOTE — Telephone Encounter (Signed)
Bethann Berkshire, nurse from [facility], called request order for urinalysis and CNS due to pain when urinate. Bethann Berkshire stated that she will be there 30 more minutes and she was wondering if she can get the order before she leave pt's house. Please advise.

## 2012-10-17 NOTE — Telephone Encounter (Signed)
Notified Melissa Myers with md response...lmb

## 2012-10-17 NOTE — Telephone Encounter (Signed)
Ok to go ahead and order urinalysis and culture

## 2012-10-18 ENCOUNTER — Ambulatory Visit (HOSPITAL_COMMUNITY)
Admission: RE | Admit: 2012-10-18 | Discharge: 2012-10-18 | Disposition: A | Discharge status: Home / Self Care | Payer: Medicare Other | Source: Ambulatory Visit | Attending: Internal Medicine | Admitting: Internal Medicine

## 2012-10-18 VITALS — BP 126/58 | HR 70 | Wt 267.8 lb

## 2012-10-18 DIAGNOSIS — N259 Disorder resulting from impaired renal tubular function, unspecified: Secondary | ICD-10-CM

## 2012-10-18 DIAGNOSIS — Z794 Long term (current) use of insulin: Secondary | ICD-10-CM | POA: Insufficient documentation

## 2012-10-18 DIAGNOSIS — E119 Type 2 diabetes mellitus without complications: Secondary | ICD-10-CM | POA: Insufficient documentation

## 2012-10-18 DIAGNOSIS — Z7982 Long term (current) use of aspirin: Secondary | ICD-10-CM | POA: Diagnosis not present

## 2012-10-18 DIAGNOSIS — E785 Hyperlipidemia, unspecified: Secondary | ICD-10-CM | POA: Diagnosis not present

## 2012-10-18 DIAGNOSIS — Z79899 Other long term (current) drug therapy: Secondary | ICD-10-CM | POA: Insufficient documentation

## 2012-10-18 DIAGNOSIS — I872 Venous insufficiency (chronic) (peripheral): Secondary | ICD-10-CM | POA: Insufficient documentation

## 2012-10-18 DIAGNOSIS — I50812 Chronic right heart failure: Secondary | ICD-10-CM

## 2012-10-18 DIAGNOSIS — G4733 Obstructive sleep apnea (adult) (pediatric): Secondary | ICD-10-CM | POA: Diagnosis not present

## 2012-10-18 DIAGNOSIS — M1990 Unspecified osteoarthritis, unspecified site: Secondary | ICD-10-CM | POA: Insufficient documentation

## 2012-10-18 DIAGNOSIS — G2581 Restless legs syndrome: Secondary | ICD-10-CM | POA: Diagnosis not present

## 2012-10-18 DIAGNOSIS — I5032 Chronic diastolic (congestive) heart failure: Secondary | ICD-10-CM

## 2012-10-18 DIAGNOSIS — I10 Essential (primary) hypertension: Secondary | ICD-10-CM | POA: Diagnosis not present

## 2012-10-18 DIAGNOSIS — R0600 Dyspnea, unspecified: Secondary | ICD-10-CM

## 2012-10-18 DIAGNOSIS — G609 Hereditary and idiopathic neuropathy, unspecified: Secondary | ICD-10-CM | POA: Insufficient documentation

## 2012-10-18 DIAGNOSIS — E669 Obesity, unspecified: Secondary | ICD-10-CM | POA: Insufficient documentation

## 2012-10-18 LAB — BASIC METABOLIC PANEL
BUN: 36 mg/dL — ABNORMAL HIGH (ref 6–23)
Chloride: 92 mEq/L — ABNORMAL LOW (ref 96–112)
GFR calc non Af Amer: 64 mL/min — ABNORMAL LOW (ref >90)
Glucose, Bld: 152 mg/dL — ABNORMAL HIGH (ref 70–99)
Potassium: 3.5 mEq/L (ref 3.5–5.1)

## 2012-10-18 NOTE — Progress Notes (Signed)
Patient ID: Melissa Myers, female   DOB: 05-13-1945, 67 y.o.   MRN: [id number] PCP: Dr. Felicity Coyer  67 yo with history of HTN, DM, hyperlipidemia, obesity, and chronic dyspnea/diastolic CHF returns for cardiac evaluation. Patient had an echo in 3/11 showing moderate LVH and preserved LV systolic function. However, there was a very large LV mid-cavity gradient with valsalva.  Patient developed quite significant exertional dyspnea to the point where she was short of breath walking around her house. She had a pulmonary evaluation with Dr. Vassie Loll but no primary lung problems were identified. Patient had an ETT-myoview in 4/11 which was negative for ischemia or infarction. Right heart cath in 4/11 showed mildly elevated right heart filling pressures but normal PA pressure and normal PCWP. I started her on a beta blocker (Coreg) to try to lower her LV mid-cavity gradient (that likely occurs with exertion) and to better control BP.  V/Q scan was negative for PE.  PFTs from 5/13 showed a restrictive defect. She had a recent repeat echo in 4/14 with normal EF, mild mid-cavity LV gradient.     08/05/12 Sleep study - recommendations for CPAP or oral devices.   She returns for follow up.  Last visit voume status was elevated and she was instructed to increase torsemide to 80 mg twice a day and take an extra metolazone. Weight at home has gone down from 290 to 267 pounds. Overall she says she is feeling much better. Denies SOB/PND. Mild dyspnea with exertion. Sleeps on 1 pillow.  Intolerant CPAP. Uses oxygen as needed at home. Tries to follow low salt diet. Tries to limit fluid to < 2 liters per day but likes to eat ice. Complaint with medications but she did not take her torsemide this am. [facility] following.    Labs (4/11): TGs 539, HDL 58, LDL 128, K 5.2, creatinine 1.3, TSH normal, BNP 14  Labs (5/11): K 3.9, creatinine 1.0  Labs (9/11): creatinine 1.1, BUN 43, LDL 145, HDL 60  Labs (10/11): K 4.3, creatinine 0.81,  HCT 34, LDL 115, HDL 75  Labs (11/12): K 4.2, creatinine 1.58 => 3.0, HCT 32.2, BNP 172, LDL 97, TGs 980 Labs (12/12): K 3.4, creatinine 1.2 => 1.7 Labs (1/13): K 3.9, creatinine 1.1, TGs 281, LDL 121 Labs (2/13): K 3.3, creatinine 1.2 Labs (10/13): K 4.1, creatinine 0.85 Labs (4/14): K 3.7, creatinine 1.2, BUN 43, BNP 23 Labs (5/14): K 4.1, creatinine 1.0 Labs 09/06/12 : K 4.6 Creatinine 1.0 BNP 39.0   Allergies (verified):  1) ! Sulfa  2) Morphine   Past Medical History:  1. Diabetes mellitus, type II  2. Hyperlipidemia: She has been unable to tolerate statins (has been on Vytorin, lovastatin, and Crestor) due to what sounds like rhabdo: developed muscle weakness and was told she had "muscle damage" with each of these statins. She has been told not to take statins anymore.  3. Hypertension: prev was told she had "white coat" HTN  4. Obesity  5. Restless Leg Syndrome since age 1yo  6. OA  7. peripheral neuropathy  8. OHS/OSA: Home oxygen during the day, CPAP at night.   9. Chronic nausea/diarrhea: ? IBS  10. Diastolic CHF: Echo (3/11) with normal LV size, moderate LVH, EF 65-70%, LV mid-cavity gradient reaching 63 mmHg with valsalva but minimal at rest, grade I diastolic dysfunction, cannot estimate PA systolic pressure (no TR doppler signal), RV normal. RHC (4/11): Mean RA 11, RV 37/13, PA 33/15, mean PCWP 12, CI 3.3. Echo in  11/12 showed mild LVH, EF 65-70%, no LV mid-cavity gradient was mentioned.  Echo in 4/14 showed EF 65-70%, mild LVH, grade I diastolic dysfunction, PA systolic pressure 35 mmHg, mild LV mid-cavity gradient.  11. ETT-myoview (4/11): 5'30", stopped due to fatigue, normal EF, no evidence for scar or ischemia. 12. V/Q scan 11/12: negative for PE.  Lower extremity venous doppler US (3/14): negative for DVT.  13. PFTs (5/13): FVC 50%, FEV1 62%, ratio 106%, DLCO 69%, TLC 69%.   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   ROS: All systems reviewed and negative except as per HPI.   Current Outpatient Prescriptions  Medication Sig Dispense Refill  . aspirin 81 MG tablet Take 81 mg by mouth daily.       . B-D INS SYR ULTRAFINE 1CC/31G 31G X 5/16" 1 ML MISC USE AS DIRECTED FOUR TIMES DAILY  120 each  prn  . carvedilol (COREG) 25 MG tablet Take 1 tablet (25 mg total) by mouth 2 (two) times daily with a meal. 1 and 1/2 tablets (total 37.5mg ) two times a day  90 tablet  3  . Coenzyme Q10 (COQ10) 30 MG CAPS Take 1 capsule by mouth daily.       . fenofibrate 160 MG tablet Take 1 tablet (160 mg total) by mouth daily.  30 tablet  5  . gabapentin (NEURONTIN) 300 MG capsule Take 3 capsules (900 mg total) by mouth 3 (three) times daily.  270 capsule  3  . hydrALAZINE (APRESOLINE) 50 MG tablet TAKE 1 TABLET BY MOUTH THREE TIMES DAILY  90 tablet  2  . hydrocortisone (ANUSOL-HC) 25 MG suppository Place 1 suppository (25 mg total) rectally every 12 (twelve) hours.  12 suppository  1  . insulin glulisine (APIDRA) 100 UNIT/ML injection Inject 35 Units into the skin 3 (three) times daily before meals.       [name] KLOR-CON 10 10 MEQ tablet TAKE 3 TABLETS BY MOUTH THREE TIMES DAILY  180 tablet  1  . LORazepam (ATIVAN) 0.5 MG tablet Take 1 tablet (0.5 mg total) by mouth 2 (two) times daily as needed for anxiety.  60 tablet  3  .  Magnesium 200 MG TABS Take 1 tablet by mouth 2 (two) times daily. 1 tablet two times a day  60 each    . metolazone (ZAROXOLYN) 5 MG tablet Take 5 mg 30 minutes prior to torsemide on Saturday and as needed  12 tablet  6  . omega-3 acid ethyl esters (LOVAZA) 1 G capsule TAKE 2 CAPSULES BY MOUTH TWICE DAILY  180 capsule  2  . ONE TOUCH ULTRA TEST test strip USE TO CHECK BLOOD GLUCOSE TWICE DAILY  100 each  0  . oxyCODONE-acetaminophen (PERCOCET/ROXICET) 5-325 MG per tablet Take 1 tablet by mouth every 6 (six) hours as needed. for pain  30 tablet  0  . rOPINIRole (REQUIP) 2 MG tablet Take 2 mg by mouth 2 (two) times daily.      [name] torsemide (DEMADEX) 20 MG tablet Take 4 of your 20mg  tablets (total 80mg ) two times a day  240 tablet  6  . insulin glargine (LANTUS) 100 UNIT/ML injection Inject 44 Units into the skin at bedtime.       No current facility-administered medications for this encounter.    BP 126/58  Pulse 70  Wt 267 lb 12.8 oz (121.473 kg)  BMI 41.93 kg/m2  SpO2 92% General: NAD, obese Mom present Neck: Thick, JVP 5-6, no thyromegaly or thyroid nodule.  Lungs: Clear to auscultation bilaterally with normal respiratory effort. CV: Nondisplaced PMI.  Heart regular S1/S2, no S3/S4, 2/6 SEM RUSB.  Chronic edema to knees bilaterally.  No carotid bruit.  Abdomen: Soft, nontender, no hepatosplenomegaly, no distention.  Skin: Venous stasis changes lower legs bilaterally.    Neurologic: Alert and oriented x 3.  Psych: Normal affect. Extremities: R and LLE trace edema.    Assessment/Plan: 1. Chronic diastolic CHF:   Volume status much improved. Weight down 23 pounds.  - Continue Coreg as there is still a mid-cavity gradient present on echo (mild compared to past).   - Continue  torsemide 80 mg twice a day and KCl at current doses. - Give metolazone 5 mg on Saturdays. Prior to am torsemide. - Stressed the importance of limiting fludi intake and low salt food choices. Counseled extensively on  need for dietary compliance. Provided HF weight log to document daily weights.  - Check BMET and bnp today.   2. Pulmonary: Suspect OHS/OSA.   -Intolerant CPAP.    - Continue home oxygen.   - She has pulmonary followup (Dr. Vassie Loll).   3. Morbid obesity: Needs weight loss but very difficult given impaired mobility from arthritis and dietary habits  4. Renal:  Check BMET today  5. R and LLE venous insufficiency -Intolerant Unna Boots/Profore dressings. Ace wraps daily.   Follow up in 6 weeks.    Myers,AMY, NP-C 10:27 AM  Patient seen with NP, agree with the above note.  Volume improved compared to the last time I saw her and weight is down.  No JVD.  She will continue the current diuretic dosing.  She does have a history of renal insufficiency so we will check BMET and BNP today. She will return in 6 wks.   Marca Ancona 10/18/2012 10:54 AM

## 2012-10-18 NOTE — Patient Instructions (Addendum)
Follow up in 6 weeks  Do the following things EVERYDAY: 1) Weigh yourself in the morning before breakfast. Write it down and keep it in a log. 2) Take your medicines as prescribed 3) Eat low salt foods-Limit salt (sodium) to 2000 mg per day.  4) Stay as active as you can everyday 5) Limit all fluids for the day to less than 2 liters 

## 2012-10-24 ENCOUNTER — Encounter: Payer: Self-pay | Admitting: Endocrinology

## 2012-10-24 ENCOUNTER — Ambulatory Visit (INDEPENDENT_AMBULATORY_CARE_PROVIDER_SITE_OTHER): Payer: Medicare Other | Admitting: Endocrinology

## 2012-10-24 VITALS — BP 124/74 | Ht 61.0 in | Wt 277.0 lb

## 2012-10-24 DIAGNOSIS — E1029 Type 1 diabetes mellitus with other diabetic kidney complication: Secondary | ICD-10-CM

## 2012-10-24 LAB — MICROALBUMIN / CREATININE URINE RATIO
Creatinine,U: 83 mg/dL
Microalb, Ur: 1.3 mg/dL (ref 0.0–1.9)

## 2012-10-24 NOTE — Progress Notes (Signed)
Subjective:    Patient ID: Melissa Myers, female    DOB: August 25, 1945, 67 y.o.   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  She has increased the apidra to 40 units 3 times a day (just before each meal).  She has no cbg record, but states cbg's vary from 109-400.  It is highest at hs, and lowest before lunch, and in the afternoon.  pt states she feels well in general.   Past Medical History  Diagnosis Date  . Type II or unspecified type diabetes mellitus without mention of complication, not stated as uncontrolled     insulin dep  . Hyperlipidemia     hx rhabdo on statins  . Hypertension   . CHF (congestive heart failure)     diastolic  . OSA (obstructive sleep apnea)     insignificant apnea events 05/2009 sleep study  . Chronic diarrhea     a/w nausea - felt related to IBS  . RLS (restless legs syndrome)   . Osteoarthritis   . Stasis dermatitis   . Shortness of breath   . GERD (gastroesophageal reflux disease)   . Anemia     Past Surgical History  Procedure Laterality Date  . Tubal ligation  1980  . Cholecystectomy  1997  . Tonsillectomy  1970  . Uterine polyp removal  2008  . Umbilical hernia repair  1995    History   Social History  . Marital Status: Married    Spouse Name: N/A    Number of Children: N/A  . Years of Education: N/A   Occupational History  . Not on file.   Social History Main Topics  . Smoking status: Never Smoker   . Smokeless tobacco: Never Used  . Alcohol Use: No  . Drug Use: No  . Sexual Activity: No   Other Topics Concern  . Not on file   Social History Narrative   Lives with spouse and mother. Retired [employer] operational officer, now housewife    Current Outpatient Prescriptions on File Prior to Visit  Medication Sig Dispense Refill  . aspirin 81 MG tablet Take 81 mg by mouth daily.       . B-D INS  SYR ULTRAFINE 1CC/31G 31G X 5/16" 1 ML MISC USE AS DIRECTED FOUR TIMES DAILY  120 each  prn  . carvedilol (COREG) 25 MG tablet Take 1 tablet (25 mg total) by mouth 2 (two) times daily with a meal. 1 and 1/2 tablets (total 37.5mg ) two times a day  90 tablet  3  . Coenzyme Q10 (COQ10) 30 MG CAPS Take 1 capsule by mouth daily.       . fenofibrate 160 MG tablet Take 1 tablet (160 mg total) by mouth daily.  30 tablet  5  . gabapentin (NEURONTIN) 300 MG capsule Take 3 capsules (900 mg total) by mouth 3 (three) times daily.  270 capsule  3  . hydrALAZINE (APRESOLINE) 50 MG tablet TAKE 1 TABLET BY MOUTH THREE TIMES DAILY  90 tablet  2  . hydrocortisone (ANUSOL-HC) 25 MG suppository Place 1 suppository (25 mg total) rectally every 12 (twelve) hours.  12 suppository  1  . KLOR-CON 10 10 MEQ tablet TAKE 3 TABLETS BY MOUTH THREE TIMES DAILY  180 tablet  1  . LORazepam (ATIVAN) 0.5 MG tablet Take 1 tablet (0.5 mg total) by mouth 2 (two) times  daily as needed for anxiety.  60 tablet  3  . Magnesium 200 MG TABS Take 1 tablet by mouth 2 (two) times daily. 1 tablet two times a day  60 each    . metolazone (ZAROXOLYN) 5 MG tablet Take 5 mg 30 minutes prior to torsemide on Saturday and as needed  12 tablet  6  . omega-3 acid ethyl esters (LOVAZA) 1 G capsule TAKE 2 CAPSULES BY MOUTH TWICE DAILY  180 capsule  2  . ONE TOUCH ULTRA TEST test strip USE TO CHECK BLOOD GLUCOSE TWICE DAILY  100 each  0  . oxyCODONE-acetaminophen (PERCOCET/ROXICET) 5-325 MG per tablet Take 1 tablet by mouth every 6 (six) hours as needed. for pain  30 tablet  0  . rOPINIRole (REQUIP) 2 MG tablet Take 2 mg by mouth 2 (two) times daily.      [name] torsemide (DEMADEX) 20 MG tablet Take 4 of your 20mg  tablets (total 80mg ) two times a day  240 tablet  6  . insulin glargine (LANTUS) 100 UNIT/ML injection Inject 44 Units into the skin at bedtime.       No current facility-administered medications on file prior to visit.    Allergies  Allergen  Reactions  . Levemir [Insulin Detemir]     itching  . Morphine     REACTION: GI upset and headaches  . Sulfa Antibiotics Diarrhea  . Sulfonamide Derivatives   . Zinc Swelling and Rash    Family History  Problem Relation Age of Onset  . Heart disease Mother 61    MI  . Heart disease Father 55    MI  . Heart disease Other     MI- uncle and multiple aunts (aunts with CVAs)  . Cancer Other     Stomach cancer grandmother   BP 124/74  Ht 5\' 1"  (1.549 m)  Wt 277 lb (125.646 kg)  BMI 52.37 kg/m2  SpO2 93%  Review of Systems She has lost weight, due to her efforts.  denies hypoglycemia.      Objective:   Physical Exam VITAL SIGNS:  See vs page GENERAL: no distress  Lab Results  Component Value Date   HGBA1C 8.8* 10/24/2012      Assessment & Plan:  DM: This insulin regimen was chosen from multiple options, as it best matches her insulin to her changing requirements throughout the day.  The benefits of glycemic control must be weighed against the risks of hypoglycemia.  She needs increased rx weight loss.  This helps the control of her DM. Renal failure.  In this setting, she probably needs less basal insulin.

## 2012-10-24 NOTE — Patient Instructions (Addendum)
Please come back for a follow-up appointment in 6 weeks check your blood sugar 2 times a day.  vary the time of day when you check, between before the 3 meals, and at bedtime.  also check if you have symptoms of your blood sugar being too high or too low.  please keep a record of the readings and bring it to your next appointment here.  please call us sooner if your blood sugar goes below 70, or if it stays over 200.   blood and urine tests are being requested for you today.  We'll contact you with results.

## 2012-10-25 ENCOUNTER — Telehealth: Payer: Self-pay | Admitting: *Deleted

## 2012-10-25 MED ORDER — INSULIN GLULISINE 100 UNIT/ML IJ SOLN: INTRAMUSCULAR | Status: DC

## 2012-10-25 NOTE — Telephone Encounter (Signed)
Called pt and advised her of her lab results. Advised her that her bg is improved but still high, A1c 8.8; was 9.3.  Dr Everardo All advises to increase your Apidra dosage to 55 units at supper and come back in 6 weeks for a follow up. Pt understood and requested a refill of Apidra to accommodate for the increase in dosage.

## 2012-10-29 ENCOUNTER — Telehealth: Payer: Self-pay | Admitting: Internal Medicine

## 2012-10-29 ENCOUNTER — Ambulatory Visit (INDEPENDENT_AMBULATORY_CARE_PROVIDER_SITE_OTHER): Payer: Medicare Other | Admitting: Internal Medicine

## 2012-10-29 ENCOUNTER — Encounter: Payer: Self-pay | Admitting: Internal Medicine

## 2012-10-29 VITALS — BP 138/72 | HR 71 | Temp 98.2°F | Wt 268.0 lb

## 2012-10-29 DIAGNOSIS — H9190 Unspecified hearing loss, unspecified ear: Secondary | ICD-10-CM | POA: Diagnosis not present

## 2012-10-29 DIAGNOSIS — H9192 Unspecified hearing loss, left ear: Secondary | ICD-10-CM

## 2012-10-29 MED ORDER — CIPROFLOXACIN HCL 500 MG PO TABS: 500.0000 mg | ORAL_TABLET | Freq: Two times a day (BID) | ORAL | Status: DC

## 2012-10-29 NOTE — Patient Instructions (Signed)
Hearing Loss  A hearing loss is sometimes called deafness. Hearing loss may be partial or total.  CAUSES  Hearing loss may be caused by:   Wax in the ear canal.   Infection of the ear canal.   Infection of the middle ear.   Trauma to the ear or surrounding area.   Fluid in the middle ear.   A hole in the eardrum (perforated eardrum).   Exposure to loud sounds or music.   Problems with the hearing nerve.   Certain medications.  Hearing loss without wax, infection, or a history of injury may mean that the nerve is involved. Hearing loss with severe dizziness, nausea and vomiting or ringing in the ear may suggest a hearing nerve irritation or problems in the middle or inner ear. If hearing loss is untreated, there is a greater likelihood for residual or permanent hearing loss.  DIAGNOSIS  A hearing test (audiometry) assesses hearing loss. The audiometry test needs to be performed by a hearing specialist (audiologist).  TREATMENT  Treatment for recent onset of hearing loss may include:   Ear wax removal.   Medications that kill germs (antibiotics).   Cortisone medications.   Prompt follow up with the appropriate specialist.  Return of hearing depends on the cause of your hearing loss, so proper medical follow-up is important. Some hearing loss may not be reversible, and a caregiver should discuss care and treatment options with you.  SEEK MEDICAL CARE IF:    You have a severe headache, dizziness, or changes in vision.   You have new or increased weakness.   You develop repeated vomiting or other serious medical problems.   You have a fever.  Document Released: 02/07/2005 Document Revised: 05/02/2011 Document Reviewed: 06/04/2009  [employer] Patient Information 2014 [employer]

## 2012-10-29 NOTE — Progress Notes (Signed)
Subjective:    Patient ID: Melissa Myers, female    DOB: March 22, 1945, 67 y.o.   MRN: [id number]  HPI  Pt presents to the clinic today with c/o  "can't hear out of her left ear". This started 2 days ago. She reports that she can only hear static out of that ear. She reports that she has never had difficulty hearing out that ear before. She has not had any trauma to the head. She denies fever, chills or body aches. She is hearing normally out of the right ear.  Review of Systems  Past Medical History  Diagnosis Date  . Type II or unspecified type diabetes mellitus without mention of complication, not stated as uncontrolled     insulin dep  . Hyperlipidemia     hx rhabdo on statins  . Hypertension   . CHF (congestive heart failure)     diastolic  . OSA (obstructive sleep apnea)     insignificant apnea events 05/2009 sleep study  . Chronic diarrhea     a/w nausea - felt related to IBS  . RLS (restless legs syndrome)   . Osteoarthritis   . Stasis dermatitis   . Shortness of breath   . GERD (gastroesophageal reflux disease)   . Anemia     Current Outpatient Prescriptions  Medication Sig Dispense Refill  . aspirin 81 MG tablet Take 81 mg by mouth daily.       . B-D INS SYR ULTRAFINE 1CC/31G 31G X 5/16" 1 ML MISC USE AS DIRECTED FOUR TIMES DAILY  120 each  prn  . carvedilol (COREG) 25 MG tablet Take 1 tablet (25 mg total) by mouth 2 (two) times daily with a meal. 1 and 1/2 tablets (total 37.5mg ) two times a day  90 tablet  3  . ciprofloxacin (CIPRO) 500 MG tablet Take 1 tablet (500 mg total) by mouth 2 (two) times daily.  14 tablet  0  . Coenzyme Q10 (COQ10) 30 MG CAPS Take 1 capsule by mouth daily.       . fenofibrate 160 MG tablet Take 1 tablet (160 mg total) by mouth daily.  30 tablet  5  . gabapentin (NEURONTIN) 300 MG capsule Take 3 capsules (900 mg total) by mouth 3 (three) times daily.  270 capsule  3  . hydrALAZINE (APRESOLINE) 50 MG tablet TAKE 1 TABLET BY MOUTH THREE  TIMES DAILY  90 tablet  2  . hydrocortisone (ANUSOL-HC) 25 MG suppository Place 1 suppository (25 mg total) rectally every 12 (twelve) hours.  12 suppository  1  . insulin glargine (LANTUS) 100 UNIT/ML injection Inject 44 Units into the skin at bedtime.      . insulin glulisine (APIDRA) 100 UNIT/ML injection INJECT INSULIN 3 TIMES A DAY (40-40-55) UNITS.  10 mL  4  . KLOR-CON 10 10 MEQ tablet TAKE 3 TABLETS BY MOUTH THREE TIMES DAILY  180 tablet  1  . LORazepam (ATIVAN) 0.5 MG tablet Take 1 tablet (0.5 mg total) by mouth 2 (two) times daily as needed for anxiety.  60 tablet  3  . Magnesium 200 MG TABS Take 1 tablet by mouth 2 (two) times daily. 1 tablet two times a day  60 each    . metolazone (ZAROXOLYN) 5 MG tablet Take 5 mg 30 minutes prior to torsemide on Saturday and as needed  12 tablet  6  . omega-3 acid ethyl esters (LOVAZA) 1 G capsule TAKE 2 CAPSULES BY MOUTH TWICE DAILY  180 capsule  2  . ONE TOUCH ULTRA TEST test strip USE TO CHECK BLOOD GLUCOSE TWICE DAILY  100 each  0  . oxyCODONE-acetaminophen (PERCOCET/ROXICET) 5-325 MG per tablet Take 1 tablet by mouth every 6 (six) hours as needed. for pain  30 tablet  0  . rOPINIRole (REQUIP) 2 MG tablet Take 2 mg by mouth 2 (two) times daily.      [name] torsemide (DEMADEX) 20 MG tablet Take 4 of your 20mg  tablets (total 80mg ) two times a day  240 tablet  6   No current facility-administered medications for this visit.    Allergies  Allergen Reactions  . Levemir [Insulin Detemir]     itching  . Morphine     REACTION: GI upset and headaches  . Sulfa Antibiotics Diarrhea  . Sulfonamide Derivatives   . Zinc Swelling and Rash    Family History  Problem Relation Age of Onset  . Heart disease Mother 8    MI  . Heart disease Father 41    MI  . Heart disease Other     MI- uncle and multiple aunts (aunts with CVAs)  . Cancer Other     Stomach cancer grandmother    History   Social History  . Marital Status: Married    Spouse Name:  N/A    Number of Children: N/A  . Years of Education: N/A   Occupational History  . Not on file.   Social History Main Topics  . Smoking status: Never Smoker   . Smokeless tobacco: Never Used  . Alcohol Use: No  . Drug Use: No  . Sexual Activity: No   Other Topics Concern  . Not on file   Social History Narrative   Lives with spouse and mother. Retired [employer] Services operational officer, now housewife     Constitutional: Denies fever, malaise, fatigue, headache or abrupt weight changes.  HEENT: Pt reports difficulty hearing out of right ear.  Denies eye pain, eye redness, ear pain, ringing in the ears, wax buildup, runny nose, nasal congestion, bloody nose, or sore throat..  Neurological: Denies dizziness, difficulty with memory, difficulty with speech or problems with balance and coordination.   No other specific complaints in a complete review of systems (except as listed in HPI above).     Objective:   Physical Exam  BP 138/72  Pulse 71  Temp(Src) 98.2 F (36.8 C) (Oral)  Wt 268 lb (121.564 kg)  BMI 50.66 kg/m2  SpO2 98% Wt Readings from Last 3 Encounters:  10/29/12 268 lb (121.564 kg)  10/24/12 277 lb (125.646 kg)  10/18/12 267 lb 12.8 oz (121.473 kg)    General: Appears her stated age,obese but well developed, well nourished in NAD. HEENT: Head: normal shape and size; Eyes: sclera white, no icterus, conjunctiva pink, PERRLA and EOMs intact; Ears: Tm's gray and intact, normal light reflex; Nose: mucosa pink and moist, septum midline; Throat/Mouth: Teeth present, mucosa pink and moist, no exudate, lesions or ulcerations noted.  Cardiovascular: Normal rate and rhythm. S1,S2 noted.  No murmur, rubs or gallops noted. No JVD or BLE edema. No carotid bruits noted. Pulmonary/Chest: Normal effort and positive vesicular breath sounds. No respiratory distress. No wheezes, rales or ronchi noted.  Neurological: Alert and oriented. Cranial nerves II-XII intact. Coordination normal. +DTRs  bilaterally.  BMET    Component Value Date/Time   NA 140 10/18/2012 1034   K 3.5 10/18/2012 1034   CL 92* 10/18/2012 1034   CO2 37* 10/18/2012 1034   GLUCOSE 152* 10/18/2012  1034   BUN 36* 10/18/2012 1034   CREATININE 0.91 10/18/2012 1034   CREATININE 0.97 04/15/2011 1543   CALCIUM 10.0 10/18/2012 1034   CALCIUM 10.4 07/06/2009 2236   GFRNONAA 64* 10/18/2012 1034   GFRAA 74* 10/18/2012 1034    Lipid Panel     Component Value Date/Time   CHOL 221* 08/03/2012 1258   TRIG 188.0* 08/03/2012 1258   HDL 54.80 08/03/2012 1258   CHOLHDL 4 08/03/2012 1258   VLDL 37.6 08/03/2012 1258   LDLCALC 115 12/02/2009    CBC    Component Value Date/Time   WBC 9.5 09/20/2012 1514   RBC 4.34 09/20/2012 1514   HGB 10.6* 09/20/2012 1514   HCT 32.7* 09/20/2012 1514   PLT 307.0 09/20/2012 1514   MCV 75.4* 09/20/2012 1514   MCH 23.8* 07/12/2012 0545   MCHC 32.3 09/20/2012 1514   RDW 18.3* 09/20/2012 1514   LYMPHSABS 2.4 09/20/2012 1514   MONOABS 0.7 09/20/2012 1514   EOSABS 0.3 09/20/2012 1514   BASOSABS 0.1 09/20/2012 1514    Hgb A1C Lab Results  Component Value Date   HGBA1C 8.8* 10/24/2012         Assessment & Plan:   Difficulty hearing, left ear:  No apparent infection, trauma, etc Will refer to ENT for further evaluation  RTC as needed or if symptoms persist or worsen

## 2012-10-29 NOTE — Telephone Encounter (Signed)
Please call patient. We have received lab notification of Klebsiella urinary tract infection Patient should begin antibiotics to treat same: Cipro 500 mg twice daily for one week -erx done No other changes recommended. Thanks

## 2012-10-29 NOTE — Telephone Encounter (Signed)
Pt here seeing Rene Kocher will give md response concerning urine...lmb

## 2012-10-30 DIAGNOSIS — H9120 Sudden idiopathic hearing loss, unspecified ear: Secondary | ICD-10-CM | POA: Diagnosis not present

## 2012-10-30 DIAGNOSIS — H905 Unspecified sensorineural hearing loss: Secondary | ICD-10-CM | POA: Diagnosis not present

## 2012-11-01 ENCOUNTER — Telehealth: Payer: Self-pay | Admitting: *Deleted

## 2012-11-01 NOTE — Telephone Encounter (Signed)
[facility] physical therapist called requesting verbal order to continue PT on pt.  Please advise

## 2012-11-02 NOTE — Telephone Encounter (Signed)
Verbal ok?

## 2012-11-02 NOTE — Telephone Encounter (Signed)
Spoke with Bonita Quin advised of MDs order

## 2012-11-05 DIAGNOSIS — I872 Venous insufficiency (chronic) (peripheral): Secondary | ICD-10-CM | POA: Diagnosis not present

## 2012-11-05 DIAGNOSIS — E109 Type 1 diabetes mellitus without complications: Secondary | ICD-10-CM | POA: Diagnosis not present

## 2012-11-05 DIAGNOSIS — G2581 Restless legs syndrome: Secondary | ICD-10-CM | POA: Diagnosis not present

## 2012-11-05 DIAGNOSIS — I10 Essential (primary) hypertension: Secondary | ICD-10-CM | POA: Diagnosis not present

## 2012-11-05 DIAGNOSIS — I509 Heart failure, unspecified: Secondary | ICD-10-CM | POA: Diagnosis not present

## 2012-11-05 DIAGNOSIS — I5031 Acute diastolic (congestive) heart failure: Secondary | ICD-10-CM | POA: Diagnosis not present

## 2012-11-05 DIAGNOSIS — L97909 Non-pressure chronic ulcer of unspecified part of unspecified lower leg with unspecified severity: Secondary | ICD-10-CM | POA: Diagnosis not present

## 2012-11-05 DIAGNOSIS — G4733 Obstructive sleep apnea (adult) (pediatric): Secondary | ICD-10-CM | POA: Diagnosis not present

## 2012-11-06 ENCOUNTER — Encounter: Payer: Self-pay | Admitting: Internal Medicine

## 2012-11-12 ENCOUNTER — Telehealth: Payer: Self-pay | Admitting: *Deleted

## 2012-11-12 DIAGNOSIS — Z1211 Encounter for screening for malignant neoplasm of colon: Secondary | ICD-10-CM

## 2012-11-12 NOTE — Telephone Encounter (Signed)
Pt has BMI of 50.66, is pt okay for direct hospital colonoscopy or does this pt need OV before scheduling colonoscopy? Previsit appointment is schedule for 9/24 at 11am, colonoscopy at [facility] for Wed 10/8 at 1130 had been cancelled.-adm

## 2012-11-12 NOTE — Telephone Encounter (Signed)
Please schedule with Propofol on my hospital week. Thanx

## 2012-11-12 NOTE — Telephone Encounter (Signed)
Case in. Will call [facility] endo

## 2012-11-13 ENCOUNTER — Other Ambulatory Visit: Payer: Self-pay | Admitting: *Deleted

## 2012-11-13 DIAGNOSIS — H9120 Sudden idiopathic hearing loss, unspecified ear: Secondary | ICD-10-CM | POA: Diagnosis not present

## 2012-11-13 DIAGNOSIS — Z1211 Encounter for screening for malignant neoplasm of colon: Secondary | ICD-10-CM

## 2012-11-13 NOTE — Telephone Encounter (Signed)
Scheduled patient on 12/03/12 at 12:30 PM [facility] endo for Prop colonoscopy([name]). Patient notified of change in procedure date, time and place. She will come for pre visit.

## 2012-11-14 ENCOUNTER — Encounter (HOSPITAL_COMMUNITY): Payer: Self-pay | Admitting: *Deleted

## 2012-11-14 ENCOUNTER — Ambulatory Visit (AMBULATORY_SURGERY_CENTER): Payer: Self-pay | Admitting: *Deleted

## 2012-11-14 VITALS — Ht 61.0 in | Wt 278.0 lb

## 2012-11-14 DIAGNOSIS — Z1211 Encounter for screening for malignant neoplasm of colon: Secondary | ICD-10-CM

## 2012-11-14 MED ORDER — MOVIPREP 100 G PO SOLR: ORAL | Status: DC

## 2012-11-14 NOTE — Progress Notes (Signed)
Patient denies any allergies to eggs or soy. Patient denies any problems with anesthesia.  

## 2012-11-15 ENCOUNTER — Other Ambulatory Visit: Payer: Self-pay | Admitting: Otolaryngology

## 2012-11-15 DIAGNOSIS — H9120 Sudden idiopathic hearing loss, unspecified ear: Secondary | ICD-10-CM

## 2012-11-22 ENCOUNTER — Encounter (HOSPITAL_COMMUNITY): Payer: Self-pay | Admitting: Pharmacy Technician

## 2012-11-23 ENCOUNTER — Ambulatory Visit: Payer: Medicare Other | Admitting: Pulmonary Disease

## 2012-11-26 ENCOUNTER — Encounter (HOSPITAL_COMMUNITY): Payer: Medicare Other

## 2012-11-27 ENCOUNTER — Ambulatory Visit
Admission: RE | Admit: 2012-11-27 | Discharge: 2012-11-27 | Disposition: A | Discharge status: Home / Self Care | Payer: Medicare Other | Source: Ambulatory Visit | Attending: Otolaryngology | Admitting: Otolaryngology

## 2012-11-27 DIAGNOSIS — H9120 Sudden idiopathic hearing loss, unspecified ear: Secondary | ICD-10-CM

## 2012-11-27 DIAGNOSIS — H9190 Unspecified hearing loss, unspecified ear: Secondary | ICD-10-CM | POA: Diagnosis not present

## 2012-11-27 MED ORDER — GADOBENATE DIMEGLUMINE 529 MG/ML IV SOLN
20.0000 mL | Freq: Once | INTRAVENOUS | Status: AC | PRN
  Administered 2012-11-27: 20 mL via INTRAVENOUS

## 2012-11-28 ENCOUNTER — Encounter: Payer: Medicare Other | Admitting: Internal Medicine

## 2012-12-03 ENCOUNTER — Encounter (HOSPITAL_COMMUNITY)
Admission: RE | Disposition: A | Discharge status: Home / Self Care | Payer: Self-pay | Source: Ambulatory Visit | Attending: Internal Medicine

## 2012-12-03 ENCOUNTER — Encounter (HOSPITAL_COMMUNITY): Payer: Medicare Other | Admitting: Anesthesiology

## 2012-12-03 ENCOUNTER — Encounter (HOSPITAL_COMMUNITY): Payer: Self-pay | Admitting: *Deleted

## 2012-12-03 ENCOUNTER — Ambulatory Visit (HOSPITAL_COMMUNITY)
Admission: RE | Admit: 2012-12-03 | Discharge: 2012-12-03 | Disposition: A | Discharge status: Home / Self Care | Payer: Medicare Other | Source: Ambulatory Visit | Attending: Internal Medicine | Admitting: Internal Medicine

## 2012-12-03 ENCOUNTER — Ambulatory Visit (HOSPITAL_COMMUNITY): Payer: Medicare Other | Admitting: Anesthesiology

## 2012-12-03 DIAGNOSIS — E119 Type 2 diabetes mellitus without complications: Secondary | ICD-10-CM | POA: Insufficient documentation

## 2012-12-03 DIAGNOSIS — E785 Hyperlipidemia, unspecified: Secondary | ICD-10-CM | POA: Insufficient documentation

## 2012-12-03 DIAGNOSIS — Z794 Long term (current) use of insulin: Secondary | ICD-10-CM | POA: Diagnosis not present

## 2012-12-03 DIAGNOSIS — D371 Neoplasm of uncertain behavior of stomach: Secondary | ICD-10-CM | POA: Insufficient documentation

## 2012-12-03 DIAGNOSIS — G2581 Restless legs syndrome: Secondary | ICD-10-CM | POA: Diagnosis not present

## 2012-12-03 DIAGNOSIS — G4733 Obstructive sleep apnea (adult) (pediatric): Secondary | ICD-10-CM | POA: Diagnosis not present

## 2012-12-03 DIAGNOSIS — G609 Hereditary and idiopathic neuropathy, unspecified: Secondary | ICD-10-CM | POA: Insufficient documentation

## 2012-12-03 DIAGNOSIS — I509 Heart failure, unspecified: Secondary | ICD-10-CM | POA: Insufficient documentation

## 2012-12-03 DIAGNOSIS — D126 Benign neoplasm of colon, unspecified: Secondary | ICD-10-CM

## 2012-12-03 DIAGNOSIS — K219 Gastro-esophageal reflux disease without esophagitis: Secondary | ICD-10-CM | POA: Diagnosis not present

## 2012-12-03 DIAGNOSIS — Z1211 Encounter for screening for malignant neoplasm of colon: Secondary | ICD-10-CM | POA: Diagnosis not present

## 2012-12-03 DIAGNOSIS — I10 Essential (primary) hypertension: Secondary | ICD-10-CM | POA: Insufficient documentation

## 2012-12-03 HISTORY — DX: Unspecified hearing loss, unspecified ear: H91.90

## 2012-12-03 HISTORY — DX: Polyneuropathy, unspecified: G62.9

## 2012-12-03 HISTORY — DX: Dependence on supplemental oxygen: Z99.81

## 2012-12-03 HISTORY — DX: Other intervertebral disc degeneration, lumbar region: M51.36

## 2012-12-03 HISTORY — DX: Other intervertebral disc degeneration, lumbar region without mention of lumbar back pain or lower extremity pain: M51.369

## 2012-12-03 HISTORY — PX: COLONOSCOPY: SHX5424

## 2012-12-03 LAB — GLUCOSE, CAPILLARY: Glucose-Capillary: 137 mg/dL — ABNORMAL HIGH (ref 70–99)

## 2012-12-03 SURGERY — COLONOSCOPY
Anesthesia: Monitor Anesthesia Care

## 2012-12-03 MED ORDER — HYDROCORTISONE ACETATE 25 MG RE SUPP: 25.0000 mg | Freq: Every evening | RECTAL | Status: DC | PRN

## 2012-12-03 MED ORDER — KETAMINE HCL 10 MG/ML IJ SOLN
INTRAMUSCULAR | Status: DC | PRN
  Administered 2012-12-03: 20 mg via INTRAVENOUS

## 2012-12-03 MED ORDER — LACTATED RINGERS IV SOLN
INTRAVENOUS | Status: DC | PRN
  Administered 2012-12-03: 11:00:00 via INTRAVENOUS

## 2012-12-03 MED ORDER — PROPOFOL INFUSION 10 MG/ML OPTIME
INTRAVENOUS | Status: DC | PRN
  Administered 2012-12-03: 300 ug/kg/min via INTRAVENOUS

## 2012-12-03 MED ORDER — SODIUM CHLORIDE 0.9 % IV SOLN: INTRAVENOUS | Status: DC

## 2012-12-03 NOTE — Anesthesia Postprocedure Evaluation (Signed)
Anesthesia Post Note  Patient: Melissa Myers  Procedure(s) Performed: Procedure(s) (LRB): COLONOSCOPY (N/A)  Anesthesia type: MAC  Patient location: PACU  Post pain: Pain level controlled  Post assessment: Post-op Vital signs reviewed  Last Vitals: BP 129/55  Pulse 67  Temp(Src) 36.7 C (Oral)  Resp 17  Ht 4\' 11"  (1.499 m)  SpO2 99%  Post vital signs: Reviewed  Level of consciousness: awake  Complications: No apparent anesthesia complications

## 2012-12-03 NOTE — Interval H&P Note (Signed)
History and Physical Interval Note:  12/03/2012 10:58 AM  Melissa Myers  has presented today for surgery, with the diagnosis of screening colonoscopy  The various methods of treatment have been discussed with the patient and family. After consideration of risks, benefits and other options for treatment, the patient has consented to  Procedure(s): COLONOSCOPY (N/A) as a surgical intervention .  The patient's history has been reviewed, patient examined, no change in status, stable for surgery.  I have reviewed the patient's chart and labs.  Questions were answered to the patient's satisfaction.     Lina Sar

## 2012-12-03 NOTE — Op Note (Signed)
[facility] [address] [place], [address]   COLONOSCOPY PROCEDURE REPORT  PATIENT: Melissa Myers, Melissa Myers  MR#: [id number] BIRTHDATE: 09-28-1945 , 67  yrs. old GENDER: Female ENDOSCOPIST: Hart Carwin, MD REFERRED BY:  Rene Paci, M.D. PROCEDURE DATE:  12/03/2012 PROCEDURE:   Colonoscopy with snare polypectomy ASA CLASS:   Class III INDICATIONS:Average risk patient for colon cancer and last colon 2003  and in 1994) was normal, except for external hemorrhoids. MEDICATIONS: MAC sedation, administered by CRNA  DESCRIPTION OF PROCEDURE:   After the risks and benefits and of the procedure were explained, informed consent was obtained.  A digital rectal exam revealed no abnormalities of the rectum.    The Pentax Colonoscope [id number]  endoscope was introduced through the anus and advanced to the cecum, which was identified by both the appendix and ileocecal valve .  The quality of the prep was excellent, using MoviPrep .  The instrument was then slowly withdrawn as the colon was fully examined.     COLON FINDINGS: A smooth sessile polyp ranging between 5-49mm in size was found in the ascending colon.  A polypectomy was performed with a cold snare.  The resection was complete and the polyp tissue was completely retrieved.   Small external hemorrhoids were found. Retroflexed views revealed no abnormalities.     The scope was then withdrawn from the patient and the procedure completed.  COMPLICATIONS: There were no complications. ENDOSCOPIC IMPRESSION: 1.   Sessile polyp ranging between 5-103mm in size was found in the ascending colon; polypectomy was performed with a cold snare 2.   Small external hemorrhoids  RECOMMENDATIONS: 1.  Await pathology results 2.  High fiber diet 3. Anusol HC supp hs   REPEAT EXAM: for Colonoscopy.pending Path report  cc:  _______________________________ eSigned:  Hart Carwin, MD 12/03/2012 11:36 AM     PATIENT  NAME:  Melissa Myers, Melissa Myers MR#: [id number]

## 2012-12-03 NOTE — Anesthesia Preprocedure Evaluation (Addendum)
Anesthesia Evaluation  Patient identified by MRN, date of birth, ID band Patient awake    Reviewed: Allergy & Precautions, H&P , NPO status , Patient's Chart, lab work & pertinent test results, reviewed documented beta blocker date and time   Airway       Dental   Pulmonary neg pulmonary ROS, shortness of breath and with exertion, sleep apnea and Continuous Positive Airway Pressure Ventilation ,          Cardiovascular hypertension, Pt. on medications and Pt. on home beta blockers +CHF     Neuro/Psych  Neuromuscular disease negative neurological ROS  negative psych ROS   GI/Hepatic Neg liver ROS, GERD-  ,  Endo/Other  diabetes, Type 1, Insulin DependentMorbid obesity  Renal/GU Renal disease     Musculoskeletal negative musculoskeletal ROS (+)   Abdominal   Peds  Hematology negative hematology ROS (+)   Anesthesia Other Findings   Reproductive/Obstetrics negative OB ROS                           Anesthesia Physical Anesthesia Plan  ASA: IV  Anesthesia Plan: MAC   Post-op Pain Management:    Induction: Intravenous  Airway Management Planned:   Additional Equipment:   Intra-op Plan:   Post-operative Plan:   Informed Consent: I have reviewed the patients History and Physical, chart, labs and discussed the procedure including the risks, benefits and alternatives for the proposed anesthesia with the patient or authorized representative who has indicated his/her understanding and acceptance.   Dental advisory given  Plan Discussed with: CRNA  Anesthesia Plan Comments:         Anesthesia Quick Evaluation

## 2012-12-03 NOTE — H&P (Signed)
Subjective:       Patient ID: Melissa Myers, female    DOB: 11-Sep-1945, 67 y.o.   MRN: [id number]   HPI   Here for follow up - reviewed chronic medical issues and interval history      Past Medical History   Diagnosis  Date   .  Type II or unspecified type diabetes mellitus without mention of complication, not stated as uncontrolled         insulin dep   .  Hyperlipidemia         hx rhabdo on statins   .  Hypertension     .  CHF (congestive heart failure)         diastolic   .  OSA (obstructive sleep apnea)         insignificant apnea events 05/2009 sleep study   .  Chronic diarrhea         a/w nausea - felt related to IBS   .  RLS (restless legs syndrome)     .  Osteoarthritis     .  Stasis dermatitis     .  Shortness of breath     .  GERD (gastroesophageal reflux disease)     .  Anemia            Review of Systems  Constitutional: Positive for fatigue. Negative for fever and unexpected weight change.  Respiratory: Positive for shortness of breath (chronic). Negative for cough, chest tightness and wheezing.   Cardiovascular: Negative for chest pain and palpitations. Leg swelling: chronic.  Musculoskeletal: Positive for back pain (chronic) and arthralgias (chronic).          Objective:     Physical Exam BP 128/62  Pulse 63  Temp(Src) 97.7 F (36.5 C) (Oral)  Wt 265 lb 12.8 oz (120.566 kg)  BMI 41.62 kg/m2  SpO2 94% Wt Readings from Last 3 Encounters:   10/16/12  265 lb 12.8 oz (120.566 kg)   09/27/12  285 lb 1.9 oz (129.33 kg)   09/20/12  290 lb 9 oz (131.798 kg)      Constitutional: She is overweight; appears well-developed and well-nourished. No distress. mom at side Neck: Thick; Normal range of motion. Neck supple. No JVD present. No thyromegaly present.  Cardiovascular: Normal rate, regular rhythm and normal heart sounds.  No murmur heard. chronic tight/woody changes with 1+ BLE edema Pulmonary/Chest: Effort normal and breath sounds  diminished at bases bilaterally. No respiratory distress. She has no wheezes.  Skin: chronic thickening and min erythema of distal BLE. Bilateral stasis dermatitis without change - resolution of pustules on anterior shin - RLE distal leg with localized redness over medial malleolus but no current ulceration, LLE distally with cracking but no ulceration. Psychiatric: She has a dysthymic mood and affect. Her behavior is normal. Judgment and thought content normal.          Lab Results   Component  Value  Date     WBC  9.5  09/20/2012     HGB  10.6*  09/20/2012     HCT  32.7*  09/20/2012     PLT  307.0  09/20/2012     CHOL  221*  08/03/2012     TRIG  188.0*  08/03/2012     HDL  54.80  08/03/2012     LDLDIRECT  127.5  08/03/2012     ALT  19  07/11/2012     AST  26  07/11/2012  NA  142  09/06/2012     K  4.6  09/06/2012     CL  99  09/06/2012     CREATININE  1.0  09/06/2012     BUN  33*  09/06/2012     CO2  36*  09/06/2012     TSH  1.59  07/24/2012     INR  1.10  07/11/2012     HGBA1C  9.3*  07/24/2012         Assessment & Plan:      See problem list. Medications and labs reviewed today.            PERIPHERAL NEUROPATHY - Newt Lukes, MD at 10/16/2012 11:35 AM    Status: Written Related Problem: PERIPHERAL NEUROPATHY           Neuropathic leg pain - severe neuropathy per reports from [facility] - continue gabapentin TID - intol of prior trial lyrica 50 05/2012 due to side effects Add dose BZ as helps neuropathy symptoms  (takes her mom's) - will give pt her own rx         RESTLESS LEGS SYNDROME - Newt Lukes, MD at 10/16/2012 11:36 AM    Status: Written Related Problem: RESTLESS LEGS SYNDROME

## 2012-12-03 NOTE — Transfer of Care (Signed)
Immediate Anesthesia Transfer of Care Note  Patient: Melissa Myers  Procedure(s) Performed: Procedure(s): COLONOSCOPY (N/A)  Patient Location: PACU and Endoscopy Unit  Anesthesia Type:MAC  Level of Consciousness: sedated and patient cooperative  Airway & Oxygen Therapy: Patient Spontanous Breathing and Patient connected to face mask oxygen  Post-op Assessment: Report given to PACU RN and Post -op Vital signs reviewed and stable  Post vital signs: Reviewed and stable  Complications: No apparent anesthesia complications

## 2012-12-03 NOTE — Preoperative (Signed)
Beta Blockers   Reason not to administer Beta Blockers:Not Applicable 

## 2012-12-04 ENCOUNTER — Encounter: Payer: Self-pay | Admitting: Internal Medicine

## 2012-12-05 ENCOUNTER — Encounter (HOSPITAL_COMMUNITY): Payer: Self-pay | Admitting: Internal Medicine

## 2012-12-05 ENCOUNTER — Ambulatory Visit: Payer: Medicare Other | Admitting: Endocrinology

## 2012-12-10 ENCOUNTER — Ambulatory Visit: Payer: Medicare Other | Admitting: Endocrinology

## 2012-12-17 ENCOUNTER — Other Ambulatory Visit: Payer: Self-pay | Admitting: Cardiology

## 2012-12-17 ENCOUNTER — Telehealth: Payer: Self-pay | Admitting: Internal Medicine

## 2012-12-17 MED ORDER — OXYCODONE-ACETAMINOPHEN 5-325 MG PO TABS: 1.0000 | ORAL_TABLET | Freq: Four times a day (QID) | ORAL | Status: DC | PRN

## 2012-12-17 NOTE — Telephone Encounter (Signed)
Notified pt rx ready for pick-up.../lmb 

## 2012-12-17 NOTE — Telephone Encounter (Signed)
ok 

## 2012-12-17 NOTE — Telephone Encounter (Signed)
12/17/2012    Pt left message needing a refill on RX : oxyCODONE-acetaminophen (PERCOCET/ROXICET) 5-325 MG per tablet . Please contact pt @ [phone]

## 2012-12-18 ENCOUNTER — Telehealth: Payer: Self-pay | Admitting: Cardiology

## 2012-12-18 NOTE — Telephone Encounter (Signed)
She needs followup appointment.  For now, start taking metolazone twice a week 30 minutes before am torsemide (would go ahead and take a dose tomorrow)

## 2012-12-18 NOTE — Telephone Encounter (Signed)
Spoke with Bonita Quin, PT. She states pt has about 30 pound weight gain in the last couple of months, her legs have gradually become more  edematous, and pt has increase in SOB. Pt is taking torsemide 80mg  bid. I spoke with patient and confirmed this dose of torsemide, but also asked her if she was taking metolazone 2.5 mg daily on Saturdays as prescribed a couple of months ago. She said that she took it most Saturdays, but had missed an occ dose. I will forward to Dr Shirlee Latch for review.

## 2012-12-18 NOTE — Telephone Encounter (Signed)
New Problem:  Pt's physical therapist states pt's weight went from 264lbs - 293lbs in 2 months. Bonita Quin, pt's physical therapist, states the patients legs are swollen and appear to have skin break down. Please call Bonita Quin back

## 2012-12-20 NOTE — Telephone Encounter (Signed)
Pt advised, verbalized understanding, appt scheduled for 12/24/12

## 2012-12-24 ENCOUNTER — Ambulatory Visit (INDEPENDENT_AMBULATORY_CARE_PROVIDER_SITE_OTHER): Payer: Medicare Other | Admitting: Cardiology

## 2012-12-24 ENCOUNTER — Encounter: Payer: Self-pay | Admitting: Cardiology

## 2012-12-24 VITALS — BP 142/68 | HR 69 | Ht 59.0 in | Wt 273.8 lb

## 2012-12-24 DIAGNOSIS — N259 Disorder resulting from impaired renal tubular function, unspecified: Secondary | ICD-10-CM | POA: Diagnosis not present

## 2012-12-24 DIAGNOSIS — E669 Obesity, unspecified: Secondary | ICD-10-CM | POA: Diagnosis not present

## 2012-12-24 DIAGNOSIS — I5032 Chronic diastolic (congestive) heart failure: Secondary | ICD-10-CM

## 2012-12-24 DIAGNOSIS — G4730 Sleep apnea, unspecified: Secondary | ICD-10-CM

## 2012-12-24 DIAGNOSIS — R0602 Shortness of breath: Secondary | ICD-10-CM

## 2012-12-24 MED ORDER — METOLAZONE 2.5 MG PO TABS: ORAL_TABLET | ORAL | Status: DC

## 2012-12-24 NOTE — Patient Instructions (Signed)
Continue to take metolazone 2.5mg  two times a week.  Your physician recommends that you return for lab work today--BMET/BNP.  Your physician recommends that you schedule a follow-up appointment in: 1 month with Dr Shirlee Latch.

## 2012-12-25 ENCOUNTER — Telehealth: Payer: Self-pay | Admitting: Cardiology

## 2012-12-25 DIAGNOSIS — I5032 Chronic diastolic (congestive) heart failure: Secondary | ICD-10-CM

## 2012-12-25 LAB — BRAIN NATRIURETIC PEPTIDE: Pro B Natriuretic peptide (BNP): 17 pg/mL (ref 0.0–100.0)

## 2012-12-25 LAB — BASIC METABOLIC PANEL
BUN: 54 mg/dL — ABNORMAL HIGH (ref 6–23)
Calcium: 10.5 mg/dL (ref 8.4–10.5)
Creatinine, Ser: 1.3 mg/dL — ABNORMAL HIGH (ref 0.4–1.2)
GFR: 44.95 mL/min — ABNORMAL LOW (ref >60.00)
Sodium: 137 mEq/L (ref 135–145)

## 2012-12-25 NOTE — Progress Notes (Signed)
Patient ID: Melissa Myers, female   DOB: 1945/12/01, 67 y.o.   MRN: [id number] PCP: Dr. Felicity Coyer  67 yo with history of HTN, DM, hyperlipidemia, obesity, and chronic dyspnea/diastolic CHF returns for cardiac evaluation. Patient had an echo in 3/11 showing moderate LVH and preserved LV systolic function. However, there was a very large LV mid-cavity gradient with valsalva.  Patient developed quite significant exertional dyspnea to the point where she was short of breath walking around her house. She had a pulmonary evaluation with Dr. Vassie Loll but no primary lung problems were identified. Patient had an ETT-myoview in 4/11 which was negative for ischemia or infarction. Right heart cath in 4/11 showed mildly elevated right heart filling pressures but normal PA pressure and normal PCWP. I started her on a beta blocker (Coreg) to try to lower her LV mid-cavity gradient (that likely occurs with exertion) and to better control BP.  V/Q scan was negative for PE.  PFTs from 5/13 showed a restrictive defect. She had a recent repeat echo in 4/14 with normal EF, mild mid-cavity LV gradient.     Last week, she called in with increased weight.  She metolazone was increased to twice a week, and she has actually lost over 20 lbs since last week on her home scale.  On the office scale, she is up 6 lbs since the prior appointment.  She is walking around her house without dyspnea. She uses a walker because of knee and back pain.  Knee and back pain are her main limitations.  Her legs are wrapped and edema has decreased.    Labs (4/11): TGs 539, HDL 58, LDL 128, K 5.2, creatinine 1.3, TSH normal, BNP 14  Labs (5/11): K 3.9, creatinine 1.0  Labs (9/11): creatinine 1.1, BUN 43, LDL 145, HDL 60  Labs (10/11): K 4.3, creatinine 0.81, HCT 34, LDL 115, HDL 75  Labs (11/12): K 4.2, creatinine 1.58 => 3.0, HCT 32.2, BNP 172, LDL 97, TGs 980 Labs (12/12): K 3.4, creatinine 1.2 => 1.7 Labs (1/13): K 3.9, creatinine 1.1, TGs 281,  LDL 121 Labs (2/13): K 3.3, creatinine 1.2 Labs (10/13): K 4.1, creatinine 0.85 Labs (4/14): K 3.7, creatinine 1.2, BUN 43, BNP 23 Labs (5/14): K 4.1, creatinine 1.0 Labs (7/14): K 4.6 Creatinine 1.0 BNP 39.0  Labs (8/14): K 3.5, creatinine 0.91, BNP 71  ECG; NSR, RBBB, inferior Qs  Allergies (verified):  1) ! Sulfa  2) Morphine   Past Medical History:  1. Diabetes mellitus, type II  2. Hyperlipidemia: She has been unable to tolerate statins (has been on Vytorin, lovastatin, and Crestor) due to what sounds like rhabdo: developed muscle weakness and was told she had "muscle damage" with each of these statins. She has been told not to take statins anymore.  3. Hypertension: prev was told she had "white coat" HTN  4. Obesity  5. Restless Leg Syndrome since age 51yo  6. OA  7. peripheral neuropathy  8. OHS/OSA: Home oxygen during the day, CPAP at night.   9. Chronic nausea/diarrhea: ? IBS  10. Diastolic CHF: Echo (3/11) with normal LV size, moderate LVH, EF 65-70%, LV mid-cavity gradient reaching 63 mmHg with valsalva but minimal at rest, grade I diastolic dysfunction, cannot estimate PA systolic pressure (no TR doppler signal), RV normal. RHC (4/11): Mean RA 11, RV 37/13, PA 33/15, mean PCWP 12, CI 3.3. Echo in 11/12 showed mild LVH, EF 65-70%, no LV mid-cavity gradient was mentioned.  Echo in 4/14 showed EF 65-70%,  mild LVH, grade I diastolic dysfunction, PA systolic pressure 35 mmHg, mild LV mid-cavity gradient.  11. ETT-myoview (4/11): 5'30", stopped due to fatigue, normal EF, no evidence for scar or ischemia. 12. V/Q scan 11/12: negative for PE.  Lower extremity venous doppler US (3/14): negative for DVT.  13. PFTs (5/13): FVC 50%, FEV1 62%, ratio 106%, DLCO 69%, TLC 69%.   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   ROS: All systems reviewed and negative except as per HPI.   Current Outpatient Prescriptions  Medication Sig Dispense Refill  . aspirin 81 MG tablet Take 81 mg by mouth daily.       . carvedilol (COREG) 25 MG tablet Take 37.5 mg by mouth 2 (two) times daily with a meal.      . Coenzyme Q10 (COQ10) 30 MG CAPS Take 1 capsule by mouth daily.       . fenofibrate 160 MG tablet Take 160 mg by mouth every morning.      . gabapentin (NEURONTIN) 300 MG capsule Take 300 mg by mouth every 8 (eight) hours.      . hydrALAZINE (APRESOLINE) 50 MG tablet Take 50 mg by mouth 3 (three) times daily.      . insulin glargine (LANTUS) 100 UNIT/ML injection Inject 44 Units into the skin at bedtime.      . Insulin Glulisine (APIDRA [place]) Inject 40-55 Units into the skin 3 (three) times daily. Takes 40 units in the morning 40 units at lunch and 55 units at night      . KLOR-CON 10 10 MEQ tablet TAKE 3 TABLETS BY MOUTH THREE TIMES DAILY  180 tablet  0  . LORazepam (ATIVAN) 0.5 MG tablet Take 0.5 mg by mouth daily as needed for anxiety. Not currently taking      . Magnesium 200 MG TABS Take 1 tablet by mouth daily.   60 each    . metolazone (ZAROXOLYN) 2.5 MG tablet 1 tablet on Tues and Sat 30 minutes prior to torsemide  15 tablet  3  . NON FORMULARY Oxygen at home 2-4 L (Pt unsure of which one)      . omega-3 acid ethyl esters (LOVAZA) 1 G capsule Take 2 g by mouth 2 (two) times daily.      [name] oxyCODONE-acetaminophen  (PERCOCET/ROXICET) 5-325 MG per tablet Take 1 tablet by mouth every 6 (six) hours as needed for pain.  30 tablet  0  . potassium chloride (K-DUR,KLOR-CON) 10 MEQ tablet Take 30 mEq by mouth 3 (three) times daily.      [name] rOPINIRole (REQUIP) 2 MG tablet Take 2 mg by mouth 2 (two) times daily.      [name] torsemide (DEMADEX) 20 MG tablet Take 80 mg by mouth 2 (two) times daily. Takes 4 tablets twice daily  No current facility-administered medications for this visit.    BP 142/68  Pulse 69  Ht 4\' 11"  (1.499 m)  Wt 124.195 kg (273 lb 12.8 oz)  BMI 55.27 kg/m2  SpO2 91% General: NAD, obese Mom present Neck: Thick, JVP 8, no thyromegaly or thyroid nodule.  Lungs: Clear to auscultation bilaterally with normal respiratory effort. CV: Nondisplaced PMI.  Heart regular S1/S2, no S3/S4, 2/6 SEM RUSB.  Chronic edema 1/2 to knees bilaterally, legs wrapped.  No carotid bruit.  Abdomen: Soft, nontender, no hepatosplenomegaly, no distention.  Skin: Venous stasis changes lower legs bilaterally.    Neurologic: Alert and oriented x 3.  Psych: Normal affect. Extremities: R and LLE trace edema.    Assessment/Plan: 1. Chronic diastolic CHF:  Weight is down considerably since increasing metolazone to twice weekly.  Symptomatic, she is stable.   - Continue Coreg as there is still a mid-cavity gradient present on echo (mild compared to past).   - Continue  torsemide 80 mg twice a day and KCl at current doses. - Continue metolazone 2.5 mg on Tuesdays and Saturdays.  - Check BMET/BNP today.   2. Pulmonary: Suspect OHS/OSA.   - Intolerant CPAP.    - Continue home oxygen.   - She has pulmonary followup (Dr. Vassie Loll).  3. Morbid obesity: Needs weight loss but very difficult given impaired mobility from arthritis and dietary habits 4. Renal: Check BMET today 5. R and LLE venous insufficiency:  Intolerant Unna Boots/Profore dressings. Ace wraps daily.   Followup in 1 month.    Melissa Myers 12/25/2012

## 2012-12-25 NOTE — Telephone Encounter (Signed)
New message    Pt is returning your call---We are having trouble with our transfer button in scheduling---I told the pt you will call her back.

## 2012-12-25 NOTE — Telephone Encounter (Signed)
Spoke with patient about recent lab results 

## 2012-12-27 ENCOUNTER — Encounter: Payer: Self-pay | Admitting: Endocrinology

## 2012-12-27 ENCOUNTER — Ambulatory Visit (INDEPENDENT_AMBULATORY_CARE_PROVIDER_SITE_OTHER): Payer: Medicare Other | Admitting: Endocrinology

## 2012-12-27 VITALS — BP 126/72 | HR 60 | Wt 274.0 lb

## 2012-12-27 DIAGNOSIS — E1029 Type 1 diabetes mellitus with other diabetic kidney complication: Secondary | ICD-10-CM | POA: Diagnosis not present

## 2012-12-27 NOTE — Progress Notes (Signed)
Subjective:    Patient ID: Melissa Myers, female    DOB: 01-22-1946, 67 y.o.   MRN: [id number]  HPI Pt returns for insulin-requiring DM (dx'ed 2008; she has moderate sensory neuropathy of the lower extremities; she has associated renal insufficiency and h/o leg ulcers).  She has no cbg record, but states cbg's vary from 137-240.  It is highest at hs and in am.  It is lowest at lunch.    Past Medical History  Diagnosis Date  . Type II or unspecified type diabetes mellitus without mention of complication, not stated as uncontrolled     insulin dep  . Hyperlipidemia     hx rhabdo on statins  . Hypertension   . CHF (congestive heart failure)     diastolic  . Chronic diarrhea     a/w nausea - felt related to IBS  . RLS (restless legs syndrome)   . Osteoarthritis   . Stasis dermatitis   . Shortness of breath   . GERD (gastroesophageal reflux disease)   . On home oxygen therapy     uses oxygen 2 liters min per [place] at night and prn during day  . Anemia   . Neuropathy     feet, toes and fingers  . Torn ligament     left knee for last 6 months  . Disc degeneration, lumbar   . OSA (obstructive sleep apnea)     insignificant apnea events 05/2009 sleep study  . OSA (obstructive sleep apnea)     does not use cpap  . Deaf left ear started  4weeks ago    Past Surgical History  Procedure Laterality Date  . Tubal ligation  1980  . Cholecystectomy  1997  . Uterine polyp removal  2008  . Umbilical hernia repair  1995  . Tonsillectomy  1970  . Colonoscopy N/A 12/03/2012    Procedure: COLONOSCOPY;  Surgeon: Hart Carwin, MD;  Location: [facility];  Service: Endoscopy;  Laterality: N/A;    History   Social History  . Marital Status: Married    Spouse Name: N/A    Number of Children: N/A  . Years of Education: N/A   Occupational History  . Not on file.   Social History Main Topics  . Smoking status: Never Smoker   . Smokeless tobacco: Never Used  . Alcohol Use: No  .  Drug Use: No  . Sexual Activity: No   Other Topics Concern  . Not on file   Social History Narrative   Lives with spouse and mother. Retired Diplomatic Services operational officer, now housewife    Current Outpatient Prescriptions on File Prior to Visit  Medication Sig Dispense Refill  . aspirin 81 MG tablet Take 81 mg by mouth daily.       . carvedilol (COREG) 25 MG tablet Take 37.5 mg by mouth 2 (two) times daily with a meal.      . Coenzyme Q10 (COQ10) 30 MG CAPS Take 1 capsule by mouth daily.       . fenofibrate 160 MG tablet Take 160 mg by mouth every morning.      . gabapentin (NEURONTIN) 300 MG capsule Take 300 mg by mouth every 8 (eight) hours.      . hydrALAZINE (APRESOLINE) 50 MG tablet Take 50 mg by mouth 3 (three) times daily.      . insulin glargine (LANTUS) 100 UNIT/ML injection Inject 44 Units into the skin at bedtime.      . Insulin Glulisine (APIDRA [place])  Takes 40 units with breakfast, 50 units with lunch and 75 units with supper.      [name] KLOR-CON 10 10 MEQ tablet TAKE 3 TABLETS BY MOUTH THREE TIMES DAILY  180 tablet  0  . LORazepam (ATIVAN) 0.5 MG tablet Take 0.5 mg by mouth daily as needed for anxiety. Not currently taking      . Magnesium 200 MG TABS Take 1 tablet by mouth daily.   60 each    . metolazone (ZAROXOLYN) 2.5 MG tablet 1 tablet on Tues and Sat 30 minutes prior to torsemide  15 tablet  3  . NON FORMULARY Oxygen at home 2-4 L (Pt unsure of which one)      . omega-3 acid ethyl esters (LOVAZA) 1 G capsule Take 2 g by mouth 2 (two) times daily.      [name] oxyCODONE-acetaminophen (PERCOCET/ROXICET) 5-325 MG per tablet Take 1 tablet by mouth every 6 (six) hours as needed for pain.  30 tablet  0  . potassium chloride (K-DUR,KLOR-CON) 10 MEQ tablet Take 30 mEq by mouth 3 (three) times daily.      [name] rOPINIRole (REQUIP) 2 MG tablet Take 2 mg by mouth 2 (two) times daily.      [name] torsemide (DEMADEX) 20 MG tablet Take 80 mg by mouth 2 (two) times daily. Takes 4 tablets twice daily       No current  facility-administered medications on file prior to visit.   Allergies  Allergen Reactions  . Levemir [Insulin Detemir]     itching  . Morphine     REACTION: GI upset and headaches  . Sulfa Antibiotics Diarrhea  . Sulfonamide Derivatives Diarrhea  . Zinc Swelling and Rash   Family History  Problem Relation Age of Onset  . Heart disease Mother 30    MI  . Heart disease Father 82    MI  . Heart disease Other     MI- uncle and multiple aunts (aunts with CVAs)  . Stomach cancer Paternal Grandmother   . Lung cancer Paternal Grandfather   . Colon cancer Neg Hx    BP 126/72  Pulse 60  Wt 274 lb (124.286 kg)  SpO2 97%  Review of Systems denies hypoglycemia.  She has neuropathy pain.    Objective:   Physical Exam VITAL SIGNS:  See vs page GENERAL: no distress.  Morbid obesity.  In wheelchair.   PSYCH: Alert and oriented x 3.  Does not appear anxious nor depressed.      Assessment & Plan:  DM: This insulin regimen was chosen from multiple options, as it best matches her insulin to her changing requirements throughout the day.  The benefits of glycemic control must be weighed against the risks of hypoglycemia.  She needs increased rx. Renal failure.  In this setting, she probably needs less basal insulin. Painful neuropathy, persistent.

## 2012-12-27 NOTE — Patient Instructions (Addendum)
Please come back for a follow-up appointment in 1 month.  check your blood sugar 2 times a day.  vary the time of day when you check, between before the 3 meals, and at bedtime.  also check if you have symptoms of your blood sugar being too high or too low.  please keep a record of the readings and bring it to your next appointment here.  please call us sooner if your blood sugar goes below 70, or if it stays over 200.   Some specialists believe that a high amount of folic acid (4-5 mg per day) helps the neuropathy.  Please increase the apidra to Takes 40 units with breakfast, 50 units with lunch and 75 units with supper.

## 2013-01-01 ENCOUNTER — Telehealth: Payer: Self-pay | Admitting: Nurse Practitioner

## 2013-01-01 ENCOUNTER — Other Ambulatory Visit (INDEPENDENT_AMBULATORY_CARE_PROVIDER_SITE_OTHER): Payer: Medicare Other

## 2013-01-01 ENCOUNTER — Telehealth: Payer: Self-pay | Admitting: Endocrinology

## 2013-01-01 DIAGNOSIS — I5032 Chronic diastolic (congestive) heart failure: Secondary | ICD-10-CM | POA: Diagnosis not present

## 2013-01-01 LAB — BASIC METABOLIC PANEL
CO2: 33 mEq/L — ABNORMAL HIGH (ref 19–32)
Calcium: 9.7 mg/dL (ref 8.4–10.5)
Creatinine, Ser: 1.3 mg/dL — ABNORMAL HIGH (ref 0.4–1.2)
GFR: 45.37 mL/min — ABNORMAL LOW (ref >60.00)
Glucose, Bld: 442 mg/dL — ABNORMAL HIGH (ref 70–99)
Sodium: 134 mEq/L — ABNORMAL LOW (ref 135–145)

## 2013-01-01 MED ORDER — POTASSIUM CHLORIDE ER 10 MEQ PO TBCR: EXTENDED_RELEASE_TABLET | ORAL | Status: DC

## 2013-01-01 MED ORDER — INSULIN GLULISINE 100 UNIT/ML IJ SOLN: INTRAMUSCULAR | Status: DC

## 2013-01-01 NOTE — Telephone Encounter (Signed)
Reviewed lab results and plan of care with patient who verbalized understanding.  Patient reports that she was taking 30 mEq of [place] twice daily instead of the prescribed three times per day.  I advised patient to take 40 mEq twice daily until further notice and that I will notify Dr. Shirlee Latch that patient was not aware she was supposed to be taking 30 mEq TID.  Patient verbalized understanding and agreement with plan.  Patient scheduled for repeat BMET 11/21

## 2013-01-01 NOTE — Telephone Encounter (Signed)
Patient needing rx for large needles. Please advise?

## 2013-01-01 NOTE — Telephone Encounter (Signed)
Ok.  Please refill prn 

## 2013-01-02 ENCOUNTER — Telehealth: Payer: Self-pay | Admitting: Nurse Practitioner

## 2013-01-02 NOTE — Telephone Encounter (Signed)
I spoke with Dr. Shirlee Latch in person.  Dr. Shirlee Latch advised patient take [place] 30 mEq TID as he originally prescribed and repeat BMET in 1 week.  I left message for patient to call office for medication change.

## 2013-01-03 ENCOUNTER — Telehealth: Payer: Self-pay | Admitting: Cardiology

## 2013-01-03 ENCOUNTER — Other Ambulatory Visit: Payer: Self-pay | Admitting: *Deleted

## 2013-01-03 MED ORDER — "INSULIN SYRINGE-NEEDLE U-100 31G X 5/16"" 1 ML MISC": Status: DC

## 2013-01-03 MED ORDER — INSULIN GLULISINE 100 UNIT/ML IJ SOLN: INTRAMUSCULAR | Status: DC

## 2013-01-03 NOTE — Telephone Encounter (Signed)
See phone note 01/02/13

## 2013-01-03 NOTE — Telephone Encounter (Signed)
Pt advised, see phone note 01/03/13

## 2013-01-03 NOTE — Telephone Encounter (Signed)
New message ° ° ° ° ° °Returned Anne's call °

## 2013-01-03 NOTE — Telephone Encounter (Signed)
Pt advised,verbalized understanding. 

## 2013-01-03 NOTE — Telephone Encounter (Signed)
Pt called stating she needs a larger size syringe because Dr Everardo All has increased her insulin. Asked to send in a new rx for her Apidra due to him increasing of her insulin as well.

## 2013-01-04 ENCOUNTER — Telehealth: Payer: Self-pay | Admitting: Endocrinology

## 2013-01-04 ENCOUNTER — Telehealth: Payer: Self-pay

## 2013-01-04 MED ORDER — PREGABALIN 25 MG PO CAPS: 25.0000 mg | ORAL_CAPSULE | Freq: Two times a day (BID) | ORAL | Status: DC

## 2013-01-04 NOTE — Telephone Encounter (Signed)
Patient aware to stop Gabapentin and start low does Lyrica. Script has been faxed to [facility] on [address]

## 2013-01-04 NOTE — Telephone Encounter (Signed)
Pt called stating her PCP advised her to stop taking gabapentin and to begin Lyrica. Pt states that she was in a lot of pain. The pain is easing with the Lyrica and she wanted Dr Everardo All to know. Be advised.

## 2013-01-04 NOTE — Telephone Encounter (Signed)
Phone call from patient [phone]) stating her neuropathy is really bothering her to the point the Gabapentin is not helping. She would like to be advised on what to do.

## 2013-01-04 NOTE — Telephone Encounter (Signed)
Stop gabapentin Try Lyrica at low dose 25mg  2x/day - erx done I am aware that prior Lyrica trial 50mg  TID caused side effects, but i believe lower dose can help symptoms better than gabapentin without same side effects of higher dose Lyrica thanks

## 2013-01-11 ENCOUNTER — Other Ambulatory Visit: Payer: Medicare Other

## 2013-01-14 ENCOUNTER — Ambulatory Visit: Payer: Medicare Other | Admitting: Pulmonary Disease

## 2013-01-16 ENCOUNTER — Encounter: Payer: Self-pay | Admitting: Internal Medicine

## 2013-01-16 ENCOUNTER — Telehealth: Payer: Self-pay

## 2013-01-16 ENCOUNTER — Ambulatory Visit (INDEPENDENT_AMBULATORY_CARE_PROVIDER_SITE_OTHER): Payer: Medicare Other | Admitting: Internal Medicine

## 2013-01-16 ENCOUNTER — Ambulatory Visit (INDEPENDENT_AMBULATORY_CARE_PROVIDER_SITE_OTHER): Payer: Medicare Other | Admitting: *Deleted

## 2013-01-16 VITALS — BP 140/60 | HR 66 | Temp 96.8°F | Wt 277.1 lb

## 2013-01-16 DIAGNOSIS — I5032 Chronic diastolic (congestive) heart failure: Secondary | ICD-10-CM

## 2013-01-16 DIAGNOSIS — E785 Hyperlipidemia, unspecified: Secondary | ICD-10-CM | POA: Diagnosis not present

## 2013-01-16 DIAGNOSIS — Z0000 Encounter for general adult medical examination without abnormal findings: Secondary | ICD-10-CM

## 2013-01-16 DIAGNOSIS — G609 Hereditary and idiopathic neuropathy, unspecified: Secondary | ICD-10-CM

## 2013-01-16 DIAGNOSIS — I10 Essential (primary) hypertension: Secondary | ICD-10-CM

## 2013-01-16 DIAGNOSIS — Z23 Encounter for immunization: Secondary | ICD-10-CM

## 2013-01-16 DIAGNOSIS — Z1239 Encounter for other screening for malignant neoplasm of breast: Secondary | ICD-10-CM

## 2013-01-16 DIAGNOSIS — F329 Major depressive disorder, single episode, unspecified: Secondary | ICD-10-CM | POA: Diagnosis not present

## 2013-01-16 LAB — BASIC METABOLIC PANEL
BUN: 32 mg/dL — ABNORMAL HIGH (ref 6–23)
Calcium: 9.3 mg/dL (ref 8.4–10.5)
Chloride: 95 mEq/L — ABNORMAL LOW (ref 96–112)
Creatinine, Ser: 0.9 mg/dL (ref 0.4–1.2)
GFR: 65.43 mL/min (ref >60.00)
Potassium: 3.7 mEq/L (ref 3.5–5.1)

## 2013-01-16 MED ORDER — PREGABALIN 25 MG PO CAPS: 25.0000 mg | ORAL_CAPSULE | Freq: Three times a day (TID) | ORAL | Status: DC

## 2013-01-16 MED ORDER — DULOXETINE HCL 20 MG PO CPEP: 20.0000 mg | ORAL_CAPSULE | Freq: Every day | ORAL | Status: DC

## 2013-01-16 NOTE — Progress Notes (Signed)
Subjective:    Patient ID: Melissa Myers, female    DOB: 1945/12/13, 67 y.o.   MRN: [id number]  HPI   Here for medicare wellness  Diet: heart healthy, diabetic Physical activity: sedentary Depression/mood screen: positive - addressed Hearing: intact to whispered voice Visual acuity: grossly normal, performs annual eye exam  ADLs: capable Fall risk: none Home safety: good Cognitive evaluation: intact to orientation, naming, recall and repetition EOL planning: adv directives, full code/ I agree  I have personally reviewed and have noted 1. The patient's medical and social history 2. Their use of alcohol, tobacco or illicit drugs 3. Their current medications and supplements 4. The patient's functional ability including ADL's, fall risks, home safety risks and hearing or visual impairment. 5. Diet and physical activities 6. Evidence for depression or mood disorders  also reviewed chronic medical issues and interval history  Reports increasing peripheral neuropathy and pain in legs. Improved with Lyrica as compared to gabapentin.Would like to increase dose slowly if tolerated. Denies new injury. No falls or weakness   Past Medical History  Diagnosis Date  . Type II or unspecified type diabetes mellitus without mention of complication, not stated as uncontrolled     insulin dep  . Hyperlipidemia     hx rhabdo on statins  . Hypertension   . CHF (congestive heart failure)     diastolic  . Chronic diarrhea     a/w nausea - felt related to IBS  . RLS (restless legs syndrome)   . Osteoarthritis   . Stasis dermatitis   . Shortness of breath   . GERD (gastroesophageal reflux disease)   . On home oxygen therapy     uses oxygen 2 liters min per [place] at night and prn during day  . Anemia   . Neuropathy     feet, toes and fingers  . Torn ligament     left knee for last 6 months  . Disc degeneration, lumbar   . OSA (obstructive sleep apnea)     insignificant apnea events  05/2009 sleep study  . OSA (obstructive sleep apnea)     does not use cpap  . Deaf left ear started  4weeks ago   Family History  Problem Relation Age of Onset  . Heart disease Mother 46    MI  . Heart disease Father 37    MI  . Heart disease Other     MI- uncle and multiple aunts (aunts with CVAs)  . Stomach cancer Paternal Grandmother   . Lung cancer Paternal Grandfather   . Colon cancer Neg Hx    History  Substance Use Topics  . Smoking status: Never Smoker   . Smokeless tobacco: Never Used  . Alcohol Use: No    Review of Systems  Constitutional: Positive for fatigue. Negative for fever and unexpected weight change.  Respiratory: Positive for shortness of breath (chronic). Negative for cough, chest tightness and wheezing.   Cardiovascular: Negative for chest pain and palpitations. Leg swelling: chronic.  Gastrointestinal: Negative for nausea, abdominal pain and diarrhea.  Musculoskeletal: Positive for arthralgias (chronic) and back pain (chronic).  Neurological: Negative for dizziness, weakness, light-headedness and headaches.  Psychiatric/Behavioral: Positive for sleep disturbance, dysphoric mood, decreased concentration and agitation (occ per family). The patient is not nervous/anxious.   All other systems reviewed and are negative.       Objective:   Physical Exam BP 140/60  Pulse 66  Temp(Src) 96.8 F (36 C) (Oral)  Wt 277 lb  2 oz (125.703 kg)  SpO2 91% Wt Readings from Last 3 Encounters:  01/16/13 277 lb 2 oz (125.703 kg)  12/27/12 274 lb (124.286 kg)  12/24/12 273 lb 12.8 oz (124.195 kg)   Constitutional: She is overweight; appears well-developed and well-nourished. No distress. mom and spouse at side Neck: Thick; Normal range of motion. Neck supple. No JVD present. No thyromegaly present.  Cardiovascular: Normal rate, regular rhythm and normal heart sounds.  No murmur heard. chronic tight/woody changes with 1+ BLE edema Pulmonary/Chest: Effort normal and  breath sounds diminished at bases bilaterally. No respiratory distress. She has no wheezes.  Skin: chronic thickening and min erythema of distal BLE. Bilateral stasis dermatitis without change - resolution of pustules on anterior shin - RLE distal leg with localized redness over medial malleolus but no current ulceration, LLE distally with cracking but no ulceration. Psychiatric: She has a dysthymic mood and affect. Her behavior is normal. Judgment and thought content normal.       Lab Results  Component Value Date   WBC 9.5 09/20/2012   HGB 10.6* 09/20/2012   HCT 32.7* 09/20/2012   PLT 307.0 09/20/2012   CHOL 221* 08/03/2012   TRIG 188.0* 08/03/2012   HDL 54.80 08/03/2012   LDLDIRECT 127.5 08/03/2012   ALT 19 07/11/2012   AST 26 07/11/2012   NA 134* 01/01/2013   K 3.2* 01/01/2013   CL 85* 01/01/2013   CREATININE 1.3* 01/01/2013   BUN 44* 01/01/2013   CO2 33* 01/01/2013   TSH 1.59 07/24/2012   INR 1.10 07/11/2012   HGBA1C 8.8* 10/24/2012   MICROALBUR 1.3 10/24/2012    Assessment & Plan:   AWV/v70.0 - Today patient counseled on age appropriate routine health concerns for screening and prevention, each reviewed and up to date or declined. Immunizations reviewed and up to date or declined. Labs reviewed. Risk factors for depression reviewed and negative. Hearing function and visual acuity are evaluated. ADLs screened and addressed as needed. Functional ability and level of safety reviewed and appropriate. Education, counseling and referrals performed based on assessed risks today. Patient provided with a copy of personalized plan for preventive services.  Also see problem list. Medications and labs reviewed today.

## 2013-01-16 NOTE — Assessment & Plan Note (Signed)
Neuropathic leg pain - severe neuropathy per reports from [facility] - 11/2012 changed gabapentin to low dose lyrica (prior 50mg  05/2012 caused terrible side effects) continue BZ as helps neuropathy symptoms (previously using her mom's rx) -

## 2013-01-16 NOTE — Progress Notes (Signed)
Pre visit review using our clinic review tool, if applicable. No additional management support is needed unless otherwise documented below in the visit note. 

## 2013-01-16 NOTE — Assessment & Plan Note (Signed)
Chronic symptoms - resistant to medication tx Previously on celexa 2012-2013 but self DC'd same Now agreeable to cymbalta trial since may also help chronic pain symptoms -  we reviewed potential risk/benefit and possible side effects - pt understands and agrees to same  erx done

## 2013-01-16 NOTE — Patient Instructions (Addendum)
It was good to see you today.  We have reviewed your prior records including labs and tests today  Health Maintenance reviewed - pneumonia vaccine given today, consider Shingles as discussed -all other recommended immunizations and age-appropriate screenings are up-to-date.  we'll make referral for mammogram screening . Our office will contact you regarding appointment(s) once made.  Medications reviewed and updated  Increase Lyrica to 25 mg 3 times daily Begin Cymbalta 20 mg once daily for pain and depression symptoms. Take every day!  Your prescription(s) have been submitted to your pharmacy. Please take as directed and contact our office if you believe you are having problem(s) with the medication(s).  Lab work as planned with cardiology office, none here today  Please schedule followup in 3-4 months, call sooner if problems.  Health Maintenance, Female A healthy lifestyle and preventative care can promote health and wellness.  Maintain regular health, dental, and eye exams.  Eat a healthy diet. Foods like vegetables, fruits, whole grains, low-fat dairy products, and lean protein foods contain the nutrients you need without too many calories. Decrease your intake of foods high in solid fats, added sugars, and salt. Get information about a proper diet from your caregiver, if necessary.  Regular physical exercise is one of the most important things you can do for your health. Most adults should get at least 150 minutes of moderate-intensity exercise (any activity that increases your heart rate and causes you to sweat) each week. In addition, most adults need muscle-strengthening exercises on 2 or more days a week.   Maintain a healthy weight. The body mass index (BMI) is a screening tool to identify possible weight problems. It provides an estimate of body fat based on height and weight. Your caregiver can help determine your BMI, and can help you achieve or maintain a healthy weight.  For adults 20 years and older:  A BMI below 18.5 is considered underweight.  A BMI of 18.5 to 24.9 is normal.  A BMI of 25 to 29.9 is considered overweight.  A BMI of 30 and above is considered obese.  Maintain normal blood lipids and cholesterol by exercising and minimizing your intake of saturated fat. Eat a balanced diet with plenty of fruits and vegetables. Blood tests for lipids and cholesterol should begin at age 16 and be repeated every 5 years. If your lipid or cholesterol levels are high, you are over 50, or you are a high risk for heart disease, you may need your cholesterol levels checked more frequently.Ongoing high lipid and cholesterol levels should be treated with medicines if diet and exercise are not effective.  If you smoke, find out from your caregiver how to quit. If you do not use tobacco, do not start.  Lung cancer screening is recommended for adults aged 63 80 years who are at high risk for developing lung cancer because of a history of smoking. Yearly low-dose computed tomography (CT) is recommended for people who have at least a 30-pack-year history of smoking and are a current smoker or have quit within the past 15 years. A pack year of smoking is smoking an average of 1 pack of cigarettes a day for 1 year (for example: 1 pack a day for 30 years or 2 packs a day for 15 years). Yearly screening should continue until the smoker has stopped smoking for at least 15 years. Yearly screening should also be stopped for people who develop a health problem that would prevent them from having lung cancer treatment.  If you are pregnant, do not drink alcohol. If you are breastfeeding, be very cautious about drinking alcohol. If you are not pregnant and choose to drink alcohol, do not exceed 1 drink per day. One drink is considered to be 12 ounces (355 mL) of beer, 5 ounces (148 mL) of wine, or 1.5 ounces (44 mL) of liquor.  Avoid use of street drugs. Do not share needles with anyone.  Ask for help if you need support or instructions about stopping the use of drugs.  High blood pressure causes heart disease and increases the risk of stroke. Blood pressure should be checked at least every 1 to 2 years. Ongoing high blood pressure should be treated with medicines, if weight loss and exercise are not effective.  If you are 66 to 67 years old, ask your caregiver if you should take aspirin to prevent strokes.  Diabetes screening involves taking a blood sample to check your fasting blood sugar level. This should be done once every 3 years, after age 1, if you are within normal weight and without risk factors for diabetes. Testing should be considered at a younger age or be carried out more frequently if you are overweight and have at least 1 risk factor for diabetes.  Breast cancer screening is essential preventative care for women. You should practice "breast self-awareness." This means understanding the normal appearance and feel of your breasts and may include breast self-examination. Any changes detected, no matter how small, should be reported to a caregiver. Women in their 45s and 30s should have a clinical breast exam (CBE) by a caregiver as part of a regular health exam every 1 to 3 years. After age 34, women should have a CBE every year. Starting at age 16, women should consider having a mammogram (breast X-ray) every year. Women who have a family history of breast cancer should talk to their caregiver about genetic screening. Women at a high risk of breast cancer should talk to their caregiver about having an MRI and a mammogram every year.  Breast cancer gene (BRCA)-related cancer risk assessment is recommended for women who have family members with BRCA-related cancers. BRCA-related cancers include breast, ovarian, tubal, and peritoneal cancers. Having family members with these cancers may be associated with an increased risk for harmful changes (mutations) in the breast cancer  genes BRCA1 and BRCA2. Results of the assessment will determine the need for genetic counseling and BRCA1 and BRCA2 testing.  The Pap test is a screening test for cervical cancer. Women should have a Pap test starting at age 42. Between ages 56 and 44, Pap tests should be repeated every 2 years. Beginning at age 23, you should have a Pap test every 3 years as long as the past 3 Pap tests have been normal. If you had a hysterectomy for a problem that was not cancer or a condition that could lead to cancer, then you no longer need Pap tests. If you are between ages 96 and 33, and you have had normal Pap tests going back 10 years, you no longer need Pap tests. If you have had past treatment for cervical cancer or a condition that could lead to cancer, you need Pap tests and screening for cancer for at least 20 years after your treatment. If Pap tests have been discontinued, risk factors (such as a new sexual partner) need to be reassessed to determine if screening should be resumed. Some women have medical problems that increase the chance of getting cervical  cancer. In these cases, your caregiver may recommend more frequent screening and Pap tests.  The human papillomavirus (HPV) test is an additional test that may be used for cervical cancer screening. The HPV test looks for the virus that can cause the cell changes on the cervix. The cells collected during the Pap test can be tested for HPV. The HPV test could be used to screen women aged 33 years and older, and should be used in women of any age who have unclear Pap test results. After the age of 30, women should have HPV testing at the same frequency as a Pap test.  Colorectal cancer can be detected and often prevented. Most routine colorectal cancer screening begins at the age of 65 and continues through age 70. However, your caregiver may recommend screening at an earlier age if you have risk factors for colon cancer. On a yearly basis, your caregiver may  provide home test kits to check for hidden blood in the stool. Use of a small camera at the end of a tube, to directly examine the colon (sigmoidoscopy or colonoscopy), can detect the earliest forms of colorectal cancer. Talk to your caregiver about this at age 43, when routine screening begins. Direct examination of the colon should be repeated every 5 to 10 years through age 63, unless early forms of pre-cancerous polyps or small growths are found.  Hepatitis C blood testing is recommended for all people born from [birth year] through 1965 and any individual with known risks for hepatitis C.  Practice safe sex. Use condoms and avoid high-risk sexual practices to reduce the spread of sexually transmitted infections (STIs). Sexually active women aged 88 and younger should be checked for Chlamydia, which is a common sexually transmitted infection. Older women with new or multiple partners should also be tested for Chlamydia. Testing for other STIs is recommended if you are sexually active and at increased risk.  Osteoporosis is a disease in which the bones lose minerals and strength with aging. This can result in serious bone fractures. The risk of osteoporosis can be identified using a bone density scan. Women ages 44 and over and women at risk for fractures or osteoporosis should discuss screening with their caregivers. Ask your caregiver whether you should be taking a calcium supplement or vitamin D to reduce the rate of osteoporosis.  Menopause can be associated with physical symptoms and risks. Hormone replacement therapy is available to decrease symptoms and risks. You should talk to your caregiver about whether hormone replacement therapy is right for you.  Use sunscreen. Apply sunscreen liberally and repeatedly throughout the day. You should seek shade when your shadow is shorter than you. Protect yourself by wearing long sleeves, pants, a wide-brimmed hat, and sunglasses year round, whenever you are  outdoors.  Notify your caregiver of new moles or changes in moles, especially if there is a change in shape or color. Also notify your caregiver if a mole is larger than the size of a pencil eraser.  Stay current with your immunizations. Document Released: 08/23/2010 Document Revised: 06/04/2012 Document Reviewed: 08/23/2010 [facility] Patient Information 2014 [place], [place]. Exercise to Lose Weight Exercise and a healthy diet may help you lose weight. Your doctor may suggest specific exercises. EXERCISE IDEAS AND TIPS  Choose low-cost things you enjoy doing, such as walking, bicycling, or exercising to workout videos.  Take stairs instead of the elevator.  Walk during your lunch break.  Park your car further away from work or school.  Go  to a gym or an exercise class.  Start with 5 to 10 minutes of exercise each day. Build up to 30 minutes of exercise 4 to 6 days a week.  Wear shoes with good support and comfortable clothes.  Stretch before and after working out.  Work out until you breathe harder and your heart beats faster.  Drink extra water when you exercise.  Do not do so much that you hurt yourself, feel dizzy, or get very short of breath. Exercises that burn about 150 calories:  Running 1  miles in 15 minutes.  Playing volleyball for 45 to 60 minutes.  Washing and waxing a car for 45 to 60 minutes.  Playing touch football for 45 minutes.  Walking 1  miles in 35 minutes.  Pushing a stroller 1  miles in 30 minutes.  Playing basketball for 30 minutes.  Raking leaves for 30 minutes.  Bicycling 5 miles in 30 minutes.  Walking 2 miles in 30 minutes.  Dancing for 30 minutes.  Shoveling snow for 15 minutes.  Swimming laps for 20 minutes.  Walking up stairs for 15 minutes.  Bicycling 4 miles in 15 minutes.  Gardening for 30 to 45 minutes.  Jumping rope for 15 minutes.  Washing windows or floors for 45 to 60 minutes. Document Released: 03/12/2010  Document Revised: 05/02/2011 Document Reviewed: 03/12/2010 [facility] Patient Information 2014 [place], [place].

## 2013-01-16 NOTE — Telephone Encounter (Signed)
Script for Lyrica was sent to pharmacy on 01/16/2013 at 11:50 am. /met   To # [phone]

## 2013-01-23 ENCOUNTER — Other Ambulatory Visit: Payer: Self-pay | Admitting: Cardiology

## 2013-01-29 ENCOUNTER — Encounter: Payer: Self-pay | Admitting: Endocrinology

## 2013-01-29 ENCOUNTER — Ambulatory Visit (INDEPENDENT_AMBULATORY_CARE_PROVIDER_SITE_OTHER): Payer: Medicare Other | Admitting: Endocrinology

## 2013-01-29 ENCOUNTER — Telehealth: Payer: Self-pay

## 2013-01-29 VITALS — BP 120/60 | HR 67 | Temp 97.7°F | Ht 59.0 in | Wt 259.0 lb

## 2013-01-29 DIAGNOSIS — E1029 Type 1 diabetes mellitus with other diabetic kidney complication: Secondary | ICD-10-CM

## 2013-01-29 NOTE — Telephone Encounter (Signed)
Patient informed of previous labs.

## 2013-01-29 NOTE — Patient Instructions (Addendum)
Please come back for a follow-up appointment in 3 months.  check your blood sugar 2 times a day.  vary the time of day when you check, between before the 3 meals, and at bedtime.  also check if you have symptoms of your blood sugar being too high or too low.  please keep a record of the readings and bring it to your next appointment here.  please call us sooner if your blood sugar goes below 70, or if it stays over 200.   A diabetes blood test is requested for you today.  We'll contact you with results.   Please adjust the insulin to the units listed below.

## 2013-01-29 NOTE — Progress Notes (Signed)
Subjective:    Patient ID: Melissa Myers, female    DOB: [birth year], 67 y.o.   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  She has lost weight, due to her efforts.  no cbg record, but states cbg's vary from 55 (hs) to 200 (lunch).   Past Medical History  Diagnosis Date  . Type II or unspecified type diabetes mellitus without mention of complication, not stated as uncontrolled     insulin dep  . Hyperlipidemia     hx rhabdo on statins  . Hypertension   . CHF (congestive heart failure)     diastolic  . Chronic diarrhea     a/w nausea - felt related to IBS  . RLS (restless legs syndrome)   . Osteoarthritis   . Stasis dermatitis   . GERD (gastroesophageal reflux disease)   . On home oxygen therapy     uses oxygen 2 liters min per [place] at night and prn during day  . Anemia   . Neuropathy     feet, toes and fingers  . Torn ligament     left knee for last 6 months  . Disc degeneration, lumbar   . OSA (obstructive sleep apnea)     05/2009 sleep study - refuses CPAP  . Deaf     left side only    Past Surgical History  Procedure Laterality Date  . Tubal ligation  1980  . Cholecystectomy  1997  . Uterine polyp removal  2008  . Umbilical hernia repair  1995  . Tonsillectomy  1970  . Colonoscopy N/A 12/03/2012    Procedure: COLONOSCOPY;  Surgeon: Hart Carwin, MD;  Location: [facility];  Service: Endoscopy;  Laterality: N/A;    History   Social History  . Marital Status: Married    Spouse Name: N/A    Number of Children: N/A  . Years of Education: N/A   Occupational History  . Not on file.   Social History Main Topics  . Smoking status: Never Smoker   . Smokeless tobacco: Never Used  . Alcohol Use: No  . Drug Use: No  . Sexual Activity: No   Other Topics Concern  . Not on file   Social History Narrative   Lives with spouse and mother. Retired Diplomatic Services operational officer, now housewife    Current Outpatient Prescriptions on File Prior to Visit  Medication Sig Dispense Refill  . aspirin 81 MG tablet Take 81 mg by mouth daily.       . carvedilol (COREG) 25 MG tablet Take 37.5 mg by mouth 2 (two) times daily with a meal.      . Coenzyme Q10 (COQ10) 30 MG CAPS Take 1 capsule by mouth daily.       . DULoxetine (CYMBALTA) 20 MG capsule Take 1 capsule (20 mg total) by mouth daily.  30 capsule  3  . fenofibrate 160 MG tablet Take 160 mg by mouth every morning.      . hydrALAZINE (APRESOLINE) 50 MG tablet Take 50 mg by mouth 3 (three) times daily.      . Insulin Syringe-Needle U-100 (BD INSULIN SYRINGE ULTRAFINE) 31G X 5/16" 1 ML MISC Inject insulin 3 times daily as instructed. Dx code: 250.43  100 each  5  . LORazepam (ATIVAN) 0.5 MG tablet Take 0.5 mg by mouth daily as needed for anxiety. Not currently  taking      . Magnesium 200 MG TABS Take 1 tablet by mouth daily.   60 each    . metolazone (ZAROXOLYN) 2.5 MG tablet 1 tablet on Tues and Sat 30 minutes prior to torsemide  15 tablet  3  . NON FORMULARY Oxygen at home 2-4 L (Pt unsure of which one)      . omega-3 acid ethyl esters (LOVAZA) 1 G capsule Take 2 g by mouth 2 (two) times daily.      [name] oxyCODONE-acetaminophen (PERCOCET/ROXICET) 5-325 MG per tablet Take 1 tablet by mouth every 6 (six) hours as needed for pain.  30 tablet  0  . potassium chloride (KLOR-CON 10) 10 MEQ tablet 3 tablets (total 30 mEq) three times a day      . pregabalin (LYRICA) 25 MG capsule Take 1 capsule (25 mg total) by mouth 3 (three) times daily.  90 capsule  5  . rOPINIRole (REQUIP) 2 MG tablet Take 2 mg by mouth 2 (two) times daily.      [name] torsemide (DEMADEX) 20 MG tablet Take 80 mg by mouth 2 (two) times daily. Takes 4 tablets twice daily       No current facility-administered medications on file prior to visit.    Allergies  Allergen Reactions  . Levemir [Insulin Detemir]      itching  . Morphine     REACTION: GI upset and headaches  . Sulfa Antibiotics Diarrhea  . Sulfonamide Derivatives Diarrhea  . Zinc Swelling and Rash    Family History  Problem Relation Age of Onset  . Heart disease Mother 40    MI  . Heart disease Father 10    MI  . Heart disease Other     MI- uncle and multiple aunts (aunts with CVAs)  . Stomach cancer Paternal Grandmother   . Lung cancer Paternal Grandfather   . Colon cancer Neg Hx     BP 120/60  Pulse 67  Temp(Src) 97.7 F (36.5 C) (Oral)  Ht 4\' 11"  (1.499 m)  Wt 259 lb (117.482 kg)  BMI 52.28 kg/m2  SpO2 87%  Review of Systems Denies LOC.  No recent leg ulcer.    Objective:   Physical Exam VITAL SIGNS:  See vs page GENERAL: no distress  Lab Results  Component Value Date   HGBA1C 8.3* 01/29/2013      Assessment & Plan:  DM: This insulin regimen was chosen from multiple options, as it best matches her insulin to her changing requirements throughout the day.  The benefits of glycemic control must be weighed against the risks of hypoglycemia.  She needs increased rx Renal failure.  In this setting, she probably needs less basal insulin. Painful neuropathy: this limits exercise rx of DM.

## 2013-01-31 ENCOUNTER — Encounter (INDEPENDENT_AMBULATORY_CARE_PROVIDER_SITE_OTHER): Payer: Self-pay

## 2013-01-31 ENCOUNTER — Ambulatory Visit (INDEPENDENT_AMBULATORY_CARE_PROVIDER_SITE_OTHER): Payer: Medicare Other | Admitting: Cardiology

## 2013-01-31 ENCOUNTER — Encounter: Payer: Self-pay | Admitting: Cardiology

## 2013-01-31 VITALS — BP 124/58 | HR 60 | Ht 59.0 in | Wt 256.0 lb

## 2013-01-31 DIAGNOSIS — G4730 Sleep apnea, unspecified: Secondary | ICD-10-CM

## 2013-01-31 DIAGNOSIS — E876 Hypokalemia: Secondary | ICD-10-CM

## 2013-01-31 DIAGNOSIS — I5032 Chronic diastolic (congestive) heart failure: Secondary | ICD-10-CM

## 2013-01-31 LAB — BASIC METABOLIC PANEL
CO2: 39 mEq/L — ABNORMAL HIGH (ref 19–32)
Calcium: 9.8 mg/dL (ref 8.4–10.5)
Chloride: 85 mEq/L — ABNORMAL LOW (ref 96–112)
GFR: 41.15 mL/min — ABNORMAL LOW (ref >60.00)
Glucose, Bld: 144 mg/dL — ABNORMAL HIGH (ref 70–99)
Potassium: 3 mEq/L — ABNORMAL LOW (ref 3.5–5.1)
Sodium: 137 mEq/L (ref 135–145)

## 2013-01-31 MED ORDER — HYDRALAZINE HCL 25 MG PO TABS
25.0000 mg | ORAL_TABLET | Freq: Three times a day (TID) | ORAL | Status: DC
Start: 2013-01-31 — End: 2013-11-04

## 2013-01-31 NOTE — Progress Notes (Signed)
Patient ID: Melissa Myers, female   DOB: Apr 26, 1945, 67 y.o.   MRN: [id number] PCP: Dr. Felicity Coyer  67 yo with history of HTN, DM, hyperlipidemia, OHS/OSA, and chronic dyspnea/diastolic CHF returns for cardiac evaluation. Patient had an echo in 3/11 showing moderate LVH and preserved LV systolic function. However, there was a very large LV mid-cavity gradient with valsalva.  Patient developed quite significant exertional dyspnea to the point where she was short of breath walking around her house. She had a pulmonary evaluation with Dr. Vassie Loll but no primary lung problems were identified. Patient had an ETT-myoview in 4/11 which was negative for ischemia or infarction. Right heart cath in 4/11 showed mildly elevated right heart filling pressures but normal PA pressure and normal PCWP. I started her on a beta blocker (Coreg) to try to lower her LV mid-cavity gradient (that likely occurs with exertion) and to better control BP.  V/Q scan was negative for PE.  PFTs from 5/13 showed a restrictive defect. She had a recent repeat echo in 4/14 with normal EF, mild mid-cavity LV gradient.     Since last appointment, Melissa Myers has lost 17 lbs.  Creatinine has been stable.  She is taking metolazone twice a week.  She is not short of breath walking around her house though she is not very active.  She is able to do housework and cook/wash dishes without problems. She uses oxygen but does not tolerate CPAP.  She has pulmonary followup in 1/15. She is occasionally lightheaded when she stands.   Labs (4/11): TGs 539, HDL 58, LDL 128, K 5.2, creatinine 1.3, TSH normal, BNP 14  Labs (5/11): K 3.9, creatinine 1.0  Labs (9/11): creatinine 1.1, BUN 43, LDL 145, HDL 60  Labs (10/11): K 4.3, creatinine 0.81, HCT 34, LDL 115, HDL 75  Labs (11/12): K 4.2, creatinine 1.58 => 3.0, HCT 32.2, BNP 172, LDL 97, TGs 980 Labs (12/12): K 3.4, creatinine 1.2 => 1.7 Labs (1/13): K 3.9, creatinine 1.1, TGs 281, LDL 121 Labs (2/13): K  3.3, creatinine 1.2 Labs (10/13): K 4.1, creatinine 0.85 Labs (4/14): K 3.7, creatinine 1.2, BUN 43, BNP 23 Labs (5/14): K 4.1, creatinine 1.0 Labs (7/14): K 4.6 Creatinine 1.0 BNP 39.0  Labs (8/14): K 3.5, creatinine 0.91, BNP 71 Labs (11/14): K 3.7, creatinine 0.9  Allergies (verified):  1) ! Sulfa  2) Morphine   Past Medical History:  1. Diabetes mellitus, type II  2. Hyperlipidemia: She has been unable to tolerate statins (has been on Vytorin, lovastatin, and Crestor) due to what sounds like rhabdo: developed muscle weakness and was told she had "muscle damage" with each of these statins. She has been told not to take statins anymore.  3. Hypertension 4. Obesity  5. Restless Leg Syndrome  6. OA  7. peripheral neuropathy  8. OHS/OSA: Uses supplemental oxygen, does not tolerate CPAP.   9. Chronic nausea/diarrhea: ? IBS  10. Diastolic CHF: Echo (3/11) with normal LV size, moderate LVH, EF 65-70%, LV mid-cavity gradient reaching 63 mmHg with valsalva but minimal at rest, grade I diastolic dysfunction, cannot estimate PA systolic pressure (no TR doppler signal), RV normal. RHC (4/11): Mean RA 11, RV 37/13, PA 33/15, mean PCWP 12, CI 3.3. Echo in 11/12 showed mild LVH, EF 65-70%, no LV mid-cavity gradient was mentioned.  Echo in 4/14 showed EF 65-70%, mild LVH, grade I diastolic dysfunction, PA systolic pressure 35 mmHg, mild LV mid-cavity gradient.  11. ETT-myoview (4/11): 5'30", stopped due to fatigue,  normal EF, no evidence for scar or ischemia. 12. V/Q scan 11/12: negative for PE.  Lower extremity venous doppler US (3/14): negative for DVT.  13. PFTs (5/13): FVC 50%, FEV1 62%, ratio 106%, DLCO 69%, TLC 69%.   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   ROS: All systems reviewed and negative except as per HPI.   Current Outpatient Prescriptions  Medication Sig Dispense Refill  . aspirin 81 MG tablet Take 81 mg by mouth daily.       . carvedilol (COREG) 25 MG tablet Take 37.5 mg by mouth 2 (two) times daily with a meal.      . Coenzyme Q10 (COQ10) 30 MG CAPS Take 1 capsule by mouth daily.       . DULoxetine (CYMBALTA) 20 MG capsule Take 1 capsule (20 mg total) by mouth daily.  30 capsule  3  . fenofibrate 160 MG tablet Take 160 mg by mouth every morning.      . insulin glargine (LANTUS) 100 UNIT/ML injection Inject 60 Units into the skin at bedtime.      . insulin glulisine (APIDRA) 100 UNIT/ML injection Inject 50 units with breakfast, 50 units with lunch and 65 units with dinner.      . Insulin Syringe-Needle U-100 (BD INSULIN SYRINGE ULTRAFINE) 31G X 5/16" 1 ML MISC Inject insulin 3 times daily as instructed. Dx code: 250.43  100 each  5  . LORazepam (ATIVAN) 0.5 MG tablet Take 0.5 mg by mouth daily as needed for anxiety. Not currently taking      . Magnesium 200 MG TABS Take 1 tablet by mouth daily.   60 each    . metolazone (ZAROXOLYN) 2.5 MG tablet 1 tablet on Tues and Sat 30 minutes prior to torsemide  15 tablet  3  . NON FORMULARY Oxygen at home 2-4 L (Pt unsure of which one)      . omega-3 acid ethyl esters (LOVAZA) 1 G capsule Take 2 g by mouth 2 (two) times daily.      [name] oxyCODONE-acetaminophen (PERCOCET/ROXICET) 5-325 MG per tablet Take 1 tablet by mouth every 6 (six) hours as needed for pain.  30 tablet  0   . pregabalin (LYRICA) 25 MG capsule Take 1 capsule (25 mg total) by mouth 3 (three) times daily.  90 capsule  5  . rOPINIRole (REQUIP) 2 MG tablet Take 2 mg by mouth 2 (two) times daily.      [name] torsemide (DEMADEX) 20 MG tablet Take 80 mg by mouth 2 (two) times daily. Takes 4 tablets twice daily      . hydrALAZINE (APRESOLINE) 25 MG tablet Take 1 tablet (25 mg total) by mouth 3 (three) times daily.  90 tablet  6  . potassium chloride (K-DUR) 10 MEQ tablet 4 tablets (total 40 mEq) three times a day  No current facility-administered medications for this visit.    BP 124/58  Pulse 60  Ht 4\' 11"  (1.499 m)  Wt 116.121 kg (256 lb)  BMI 51.68 kg/m2 General: NAD, obese Mom present Neck: Thick, no JVD, no thyromegaly or thyroid nodule.  Lungs: Clear to auscultation bilaterally with normal respiratory effort. CV: Nondisplaced PMI.  Heart regular S1/S2, no S3/S4, 2/6 SEM RUSB.  1+ ankle edema with venous stasis changes in the lower legs.  No carotid bruit.  Abdomen: Soft, nontender, no hepatosplenomegaly, no distention.  Neurologic: Alert and oriented x 3.  Psych: Normal affect. Extremities: No clubbing/cyanosis    Assessment/Plan: 1. Chronic diastolic CHF:  Weight is down considerably since increasing metolazone to twice weekly.  She is stable with NYHA class II-III symptoms.   - Continue Coreg as there is still a mid-cavity gradient present on echo (mild compared to past).   - Continue  torsemide 80 mg twice a day and KCl at current doses. - Continue metolazone 2.5 mg on Tuesdays and Saturdays.  - Check BMET today.   2. Pulmonary: Suspect OHS/OSA.   - Intolerant CPAP.    - Continue home oxygen.   - She has pulmonary followup (Dr. Vassie Loll). 3. Renal: Check BMET today 4. Orthostatic symptoms: Relatively mild.  I will decrease her hydralazine to 25 mg tid.    Followup in 3 months    Marca Ancona 01/31/2013

## 2013-01-31 NOTE — Patient Instructions (Signed)
Decrease Hydralazine to 25mg  three times a day.   Your physician recommends that you have lab today--BMET.   Your physician recommends that you schedule a follow-up appointment in: 3 months with Dr Shirlee Latch.  Your physician recommends that you return for lab work in: 3 months--BMET.

## 2013-02-02 ENCOUNTER — Other Ambulatory Visit: Payer: Self-pay | Admitting: Cardiology

## 2013-02-11 ENCOUNTER — Other Ambulatory Visit (INDEPENDENT_AMBULATORY_CARE_PROVIDER_SITE_OTHER): Payer: Medicare Other

## 2013-02-11 ENCOUNTER — Other Ambulatory Visit: Payer: Self-pay | Admitting: *Deleted

## 2013-02-11 DIAGNOSIS — E876 Hypokalemia: Secondary | ICD-10-CM

## 2013-02-11 DIAGNOSIS — R0602 Shortness of breath: Secondary | ICD-10-CM

## 2013-02-11 DIAGNOSIS — I5032 Chronic diastolic (congestive) heart failure: Secondary | ICD-10-CM | POA: Diagnosis not present

## 2013-02-11 LAB — BASIC METABOLIC PANEL
BUN: 43 mg/dL — ABNORMAL HIGH (ref 6–23)
CO2: 39 mEq/L — ABNORMAL HIGH (ref 19–32)
Chloride: 90 mEq/L — ABNORMAL LOW (ref 96–112)
Creatinine, Ser: 0.9 mg/dL (ref 0.4–1.2)
GFR: 63.01 mL/min (ref >60.00)
Glucose, Bld: 208 mg/dL — ABNORMAL HIGH (ref 70–99)

## 2013-02-12 ENCOUNTER — Other Ambulatory Visit: Payer: Self-pay | Admitting: Internal Medicine

## 2013-02-15 ENCOUNTER — Other Ambulatory Visit (INDEPENDENT_AMBULATORY_CARE_PROVIDER_SITE_OTHER): Payer: Medicare Other

## 2013-02-15 DIAGNOSIS — R0602 Shortness of breath: Secondary | ICD-10-CM

## 2013-02-15 DIAGNOSIS — E876 Hypokalemia: Secondary | ICD-10-CM | POA: Diagnosis not present

## 2013-02-15 LAB — BASIC METABOLIC PANEL
BUN: 27 mg/dL — ABNORMAL HIGH (ref 6–23)
CO2: 34 mEq/L — ABNORMAL HIGH (ref 19–32)
GFR: 75.9 mL/min (ref >60.00)
Glucose, Bld: 137 mg/dL — ABNORMAL HIGH (ref 70–99)
Potassium: 4.1 mEq/L (ref 3.5–5.1)
Sodium: 141 mEq/L (ref 135–145)

## 2013-02-15 LAB — BRAIN NATRIURETIC PEPTIDE: Pro B Natriuretic peptide (BNP): 52 pg/mL (ref 0.0–100.0)

## 2013-02-18 ENCOUNTER — Telehealth: Payer: Self-pay | Admitting: Cardiology

## 2013-02-18 NOTE — Telephone Encounter (Signed)
Patient is returning your call. Please call back.  °

## 2013-02-18 NOTE — Telephone Encounter (Signed)
Pt is aware of lab results & will continue prescribed medication regimen. Mylo Red RN

## 2013-03-04 ENCOUNTER — Ambulatory Visit: Payer: Medicare Other

## 2013-03-04 ENCOUNTER — Telehealth: Payer: Self-pay | Admitting: Pulmonary Disease

## 2013-03-04 NOTE — Telephone Encounter (Signed)
Pt decided not to LM.  Nothing further needed at this time.  Satira Anis

## 2013-03-05 ENCOUNTER — Ambulatory Visit: Payer: Medicare Other | Admitting: Pulmonary Disease

## 2013-04-08 ENCOUNTER — Other Ambulatory Visit: Payer: Self-pay | Admitting: Cardiology

## 2013-04-08 ENCOUNTER — Other Ambulatory Visit: Payer: Self-pay | Admitting: Internal Medicine

## 2013-04-29 ENCOUNTER — Encounter: Payer: Self-pay | Admitting: Endocrinology

## 2013-04-29 ENCOUNTER — Ambulatory Visit (INDEPENDENT_AMBULATORY_CARE_PROVIDER_SITE_OTHER): Payer: Medicare Other | Admitting: Endocrinology

## 2013-04-29 VITALS — BP 126/60 | HR 76 | Temp 98.1°F | Ht 59.0 in | Wt 277.0 lb

## 2013-04-29 DIAGNOSIS — E1065 Type 1 diabetes mellitus with hyperglycemia: Principal | ICD-10-CM

## 2013-04-29 DIAGNOSIS — E1029 Type 1 diabetes mellitus with other diabetic kidney complication: Secondary | ICD-10-CM

## 2013-04-29 LAB — HEMOGLOBIN A1C: HEMOGLOBIN A1C: 7.7 % — AB (ref 4.6–6.5)

## 2013-04-29 NOTE — Patient Instructions (Signed)
Please come back for a follow-up appointment in 3 months.  check your blood sugar 2 times a day.  vary the time of day when you check, between before the 3 meals, and at bedtime.  also check if you have symptoms of your blood sugar being too high or too low.  please keep a record of the readings and bring it to your next appointment here.  please call us sooner if your blood sugar goes below 70, or if it stays over 200.   A diabetes blood test is requested for you today.  We'll contact you with results.    

## 2013-04-29 NOTE — Progress Notes (Signed)
Subjective:    Patient ID: Melissa Myers, female    DOB: Feb 08, 1946, 68 y.o.   MRN: [id number]  HPI Pt returns for insulin-requiring DM (dx'ed 2008; she has moderate sensory neuropathy of the lower extremities; she has associated renal insufficiency and h/o leg ulcers; she takes multiple daily injections).  no cbg record, but states cbg's are well-controlled.  It is in general higher as the day goes on.  pt states she feels well in general. Past Medical History  Diagnosis Date  . Type II or unspecified type diabetes mellitus without mention of complication, not stated as uncontrolled     insulin dep  . Hyperlipidemia     hx rhabdo on statins  . Hypertension   . CHF (congestive heart failure)     diastolic  . Chronic diarrhea     a/w nausea - felt related to IBS  . RLS (restless legs syndrome)   . Osteoarthritis   . Stasis dermatitis   . GERD (gastroesophageal reflux disease)   . On home oxygen therapy     uses oxygen 2 liters min per [name] at night and prn during day  . Anemia   . Neuropathy     feet, toes and fingers  . Torn ligament     left knee for last 6 months  . Disc degeneration, lumbar   . OSA (obstructive sleep apnea)     05/2009 sleep study - refuses CPAP  . Deaf     left side only    Past Surgical History  Procedure Laterality Date  . Tubal ligation  1980  . Cholecystectomy  1997  . Uterine polyp removal  2008  . Umbilical hernia repair  1995  . Tonsillectomy  1970  . Colonoscopy N/A 12/03/2012    Procedure: COLONOSCOPY;  Surgeon: Lafayette Dragon, MD;  Location: [facility];  Service: Endoscopy;  Laterality: N/A;    History   Social History  . Marital Status: Married    Spouse Name: N/A    Number of Children: N/A  . Years of Education: N/A   Occupational History  . Not on file.   Social History Main Topics  . Smoking status: Never Smoker   . Smokeless tobacco: Never Used  . Alcohol Use: No  . Drug Use: No  . Sexual Activity: No   Other  Topics Concern  . Not on file   Social History Narrative   Lives with spouse and mother. Retired Network engineer, now housewife    Current Outpatient Prescriptions on File Prior to Visit  Medication Sig Dispense Refill  . aspirin 81 MG tablet Take 81 mg by mouth daily.       . carvedilol (COREG) 25 MG tablet Take 37.5 mg by mouth 2 (two) times daily with a meal.      . Coenzyme Q10 (COQ10) 30 MG CAPS Take 1 capsule by mouth daily.       . DULoxetine (CYMBALTA) 20 MG capsule Take 1 capsule (20 mg total) by mouth daily.  30 capsule  3  . fenofibrate 160 MG tablet Take 160 mg by mouth every morning.      . hydrALAZINE (APRESOLINE) 25 MG tablet Take 1 tablet (25 mg total) by mouth 3 (three) times daily.  90 tablet  6  . insulin glargine (LANTUS) 100 UNIT/ML injection Inject 50 Units into the skin at bedtime.       . insulin glulisine (APIDRA) 100 UNIT/ML injection Inject 60 units with breakfast, 60 units  with lunch and 70 units with dinner.      . Insulin Syringe-Needle U-100 (BD INSULIN SYRINGE ULTRAFINE) 31G X 5/16" 1 ML MISC Inject insulin 3 times daily as instructed. Dx code: 250.43  100 each  5  . LORazepam (ATIVAN) 0.5 MG tablet Take 0.5 mg by mouth daily as needed for anxiety. Not currently taking      . Magnesium 200 MG TABS Take 1 tablet by mouth daily.   60 each    . metolazone (ZAROXOLYN) 2.5 MG tablet 1 tablet on Tues and Sat 30 minutes prior to torsemide  15 tablet  3  . NON FORMULARY Oxygen at home 2-4 L (Pt unsure of which one)      . omega-3 acid ethyl esters (LOVAZA) 1 G capsule Take 2 g by mouth 2 (two) times daily.      [name] oxyCODONE-acetaminophen (PERCOCET/ROXICET) 5-325 MG per tablet Take 1 tablet by mouth every 6 (six) hours as needed for pain.  30 tablet  0  . potassium chloride (K-DUR) 10 MEQ tablet 4 tablets (total 40 mEq) three times a day      . potassium chloride (KLOR-CON 10) 10 MEQ tablet Take 4 tablets (40 mEq total) by mouth 3 (three) times daily.  360 tablet  3  .  pregabalin (LYRICA) 25 MG capsule Take 1 capsule (25 mg total) by mouth 3 (three) times daily.  90 capsule  5  . rOPINIRole (REQUIP) 2 MG tablet Take 2 mg by mouth 2 (two) times daily.      [name] rOPINIRole (REQUIP) 2 MG tablet TAKE 2 TABLETS BY MOUTH EVERY NIGHT AT BEDTIME  60 tablet  5  . torsemide (DEMADEX) 20 MG tablet Take 80 mg by mouth 2 (two) times daily. Takes 4 tablets twice daily       No current facility-administered medications on file prior to visit.    Allergies  Allergen Reactions  . Levemir [Insulin Detemir]     itching  . Morphine     REACTION: GI upset and headaches  . Sulfa Antibiotics Diarrhea  . Sulfonamide Derivatives Diarrhea  . Zinc Swelling and Rash    Family History  Problem Relation Age of Onset  . Heart disease Mother 76    MI  . Heart disease Father 32    MI  . Heart disease Other     MI- uncle and multiple aunts (aunts with CVAs)  . Stomach cancer Paternal Grandmother   . Lung cancer Paternal Grandfather   . Colon cancer Neg Hx     BP 126/60  Pulse 76  Temp(Src) 98.1 F (36.7 C) (Oral)  Ht 4\' 11"  (1.499 m)  Wt 277 lb (125.646 kg)  BMI 55.92 kg/m2  SpO2 94%  Review of Systems she denies hypoglycemia and weight change.     Objective:   Physical Exam VITAL SIGNS:  See vs page. GENERAL: no distress.   Lab Results  Component Value Date   HGBA1C 7.7* 04/29/2013      Assessment & Plan:  DM: This insulin regimen was chosen from multiple options, as it best matches her insulin to her changing requirements throughout the day.  The benefits of glycemic control must be weighed against the risks of hypoglycemia.  She needs increased rx Renal failure.  In this setting, she probably needs less basal insulin. Painful neuropathy: this limits exercise rx of DM.

## 2013-04-30 ENCOUNTER — Encounter: Payer: Self-pay | Admitting: *Deleted

## 2013-05-01 ENCOUNTER — Ambulatory Visit (INDEPENDENT_AMBULATORY_CARE_PROVIDER_SITE_OTHER): Payer: Medicare Other | Admitting: Cardiology

## 2013-05-01 ENCOUNTER — Other Ambulatory Visit: Payer: Medicare Other

## 2013-05-01 VITALS — BP 144/60 | HR 74 | Ht 59.0 in | Wt 279.8 lb

## 2013-05-01 DIAGNOSIS — I10 Essential (primary) hypertension: Secondary | ICD-10-CM | POA: Diagnosis not present

## 2013-05-01 DIAGNOSIS — I5032 Chronic diastolic (congestive) heart failure: Secondary | ICD-10-CM | POA: Diagnosis not present

## 2013-05-01 DIAGNOSIS — G4730 Sleep apnea, unspecified: Secondary | ICD-10-CM

## 2013-05-01 LAB — BASIC METABOLIC PANEL
BUN: 31 mg/dL — ABNORMAL HIGH (ref 6–23)
CALCIUM: 9.2 mg/dL (ref 8.4–10.5)
CHLORIDE: 101 meq/L (ref 96–112)
CO2: 31 mEq/L (ref 19–32)
CREATININE: 0.9 mg/dL (ref 0.4–1.2)
GFR: 64.55 mL/min (ref >60.00)
Glucose, Bld: 92 mg/dL (ref 70–99)
Potassium: 4 mEq/L (ref 3.5–5.1)
Sodium: 140 mEq/L (ref 135–145)

## 2013-05-01 MED ORDER — METOLAZONE 2.5 MG PO TABS: ORAL_TABLET | ORAL | Status: DC

## 2013-05-01 NOTE — Patient Instructions (Signed)
Your physician has recommended you make the following change in your medication: Start Metolazone 2.5 MG take one tablet Thursday, Friday, and Saturday this week.  Starting next week only take one tablet every Tuesday and Thursday  Your physician recommends that you go to the lab today for a BMET  Your physician recommends that you return for lab work in: Two weeks on 05/15/13 for a BMET  Your physician recommends that you schedule a follow-up appointment in: 3 Weeks with Dr. Aundra Dubin

## 2013-05-01 NOTE — Progress Notes (Signed)
Patient ID: Melissa Myers, female   DOB: [birth year], 68 y.o.   MRN: [id number] PCP: Dr. Asa Lente  68 yo with history of HTN, DM, hyperlipidemia, OHS/OSA, and chronic dyspnea/diastolic CHF returns for cardiac evaluation. Patient had an echo in 3/11 showing moderate LVH and preserved LV systolic function. However, there was a very large LV mid-cavity gradient with valsalva.  Patient developed quite significant exertional dyspnea to the point where she was short of breath walking around her house. She had a pulmonary evaluation with Dr. Elsworth Soho but no primary lung problems were identified. Patient had an ETT-myoview in 4/11 which was negative for ischemia or infarction. Right heart cath in 4/11 showed mildly elevated right heart filling pressures but normal PA pressure and normal PCWP. I started her on a beta blocker (Coreg) to try to lower her LV mid-cavity gradient (that likely occurs with exertion) and to better control BP.  V/Q scan was negative for PE.  PFTs from 5/13 showed a restrictive defect. She had a recent repeat echo in 4/14 with normal EF, mild mid-cavity LV gradient.     At last appointment, Melissa Myers was doing well.  Unfortunately, there seems to have been a miscommunication back in December.  She was told to hold metolazone for a week but she stopped it altogether.  Weight is up about 24 lbs.  She is short of breath after walking 20-30 feet.  She has been sleeping in a recliner due to orthopnea.  Lower extremity edema is worse.   Labs (4/11): TGs 539, HDL 58, LDL 128, K 5.2, creatinine 1.3, TSH normal, BNP 14  Labs (5/11): K 3.9, creatinine 1.0  Labs (9/11): creatinine 1.1, BUN 43, LDL 145, HDL 60  Labs (10/11): K 4.3, creatinine 0.81, HCT 34, LDL 115, HDL 75  Labs (11/12): K 4.2, creatinine 1.58 => 3.0, HCT 32.2, BNP 172, LDL 97, TGs 980 Labs (12/12): K 3.4, creatinine 1.2 => 1.7 Labs (1/13): K 3.9, creatinine 1.1, TGs 281, LDL 121 Labs (2/13): K 3.3, creatinine 1.2 Labs (10/13): K  4.1, creatinine 0.85 Labs (4/14): K 3.7, creatinine 1.2, BUN 43, BNP 23 Labs (5/14): K 4.1, creatinine 1.0 Labs (7/14): K 4.6 Creatinine 1.0 BNP 39.0  Labs (8/14): K 3.5, creatinine 0.91, BNP 71 Labs (11/14): K 3.7, creatinine 0.9 Labs (12/14): K 4.1, creatinine 0.8, BNP 52  Allergies (verified):  1) ! Sulfa  2) Morphine   Past Medical History:  1. Diabetes mellitus, type II  2. Hyperlipidemia: She has been unable to tolerate statins (has been on Vytorin, lovastatin, and Crestor) due to what sounds like rhabdo: developed muscle weakness and was told she had "muscle damage" with each of these statins. She has been told not to take statins anymore.  3. Hypertension 4. Obesity  5. Restless Leg Syndrome  6. OA  7. peripheral neuropathy  8. OHS/OSA: Uses supplemental oxygen, does not tolerate CPAP.   9. Chronic nausea/diarrhea: ? IBS  10. Diastolic CHF: Echo (7/12) with normal LV size, moderate LVH, EF 65-70%, LV mid-cavity gradient reaching 63 mmHg with valsalva but minimal at rest, grade I diastolic dysfunction, cannot estimate PA systolic pressure (no TR doppler signal), RV normal. RHC (4/11): Mean RA 11, RV 37/13, PA 33/15, mean PCWP 12, CI 3.3. Echo in 11/12 showed mild LVH, EF 65-70%, no LV mid-cavity gradient was mentioned.  Echo in 4/14 showed EF 65-70%, mild LVH, grade I diastolic dysfunction, PA systolic pressure 35 mmHg, mild LV mid-cavity gradient.  11. ETT-myoview (4/11): 5'30",  stopped due to fatigue, normal EF, no evidence for scar or ischemia. 12. V/Q scan 11/12: negative for PE.  Lower extremity venous doppler US (3/14): negative for DVT.  13. PFTs (5/13): FVC 50%, FEV1 62%, ratio 106%, DLCO 69%, TLC 69%.   Family History:  Family History of Alcoholism/Addiction (parent)  Family History Diabetes 1st degree relative (grandparent)  Family History High cholesterol (parent, grandparent)  Family History Hypertension (parent, grandparent)  Family History Lung cancer  (grandparent)  Stomach cancer (grandmother)  Celiac Sprue daughter  Mother with MI at 48, father with MI at 63, uncle with MI at 34, brother with stents in his 19s, multiple aunts with MIs and CVAs   Social History:  Never Smoked  no alcohol  married, lives with spouse and her mother  retired Network engineer - now housewife  Alcohol Use - no  Illicit Drug Use - no  Review of Systems  All systems reviewed and negative except as per HPI.   ROS: All systems reviewed and negative except as per HPI.   Current Outpatient Prescriptions  Medication Sig Dispense Refill  . aspirin 81 MG tablet Take 81 mg by mouth daily.       . carvedilol (COREG) 25 MG tablet Take 37.5 mg by mouth 2 (two) times daily with a meal.      . Coenzyme Q10 (COQ10) 30 MG CAPS Take 1 capsule by mouth daily.       . DULoxetine (CYMBALTA) 20 MG capsule Take 1 capsule (20 mg total) by mouth daily.  30 capsule  3  . fenofibrate 160 MG tablet Take 160 mg by mouth every morning.      . hydrALAZINE (APRESOLINE) 25 MG tablet Take 1 tablet (25 mg total) by mouth 3 (three) times daily.  90 tablet  6  . insulin glargine (LANTUS) 100 UNIT/ML injection Inject 50 Units into the skin at bedtime.       . insulin glulisine (APIDRA) 100 UNIT/ML injection Inject 60 units with breakfast, 60 units with lunch and 70 units with dinner.      . Insulin Syringe-Needle U-100 (BD INSULIN SYRINGE ULTRAFINE) 31G X 5/16" 1 ML MISC Inject insulin 3 times daily as instructed. Dx code: 250.43  100 each  5  . LORazepam (ATIVAN) 0.5 MG tablet Take 0.5 mg by mouth daily as needed for anxiety. Not currently taking      . Magnesium 200 MG TABS Take 1 tablet by mouth daily.   60 each    . NON FORMULARY Oxygen at home 2-4 L (Pt unsure of which one)      . omega-3 acid ethyl esters (LOVAZA) 1 G capsule Take 2 g by mouth 2 (two) times daily.      [name] oxyCODONE-acetaminophen (PERCOCET/ROXICET) 5-325 MG per tablet Take 1 tablet by mouth every 6 (six) hours as needed for  pain.  30 tablet  0  . potassium chloride (KLOR-CON 10) 10 MEQ tablet Take 4 tablets (40 mEq total) by mouth 3 (three) times daily.  360 tablet  3  . pregabalin (LYRICA) 25 MG capsule Take 1 capsule (25 mg total) by mouth 3 (three) times daily.  90 capsule  5  . rOPINIRole (REQUIP) 2 MG tablet Take 2 mg by mouth 2 (two) times daily.      [name] torsemide (DEMADEX) 20 MG tablet Take 80 mg by mouth 2 (two) times daily. Takes 4 tablets twice daily      . metolazone (ZAROXOLYN) 2.5 MG tablet Take 2.5  Thurs, Friday, and Saturday this week. Starting next week on 3/16 only take every Tues and Sat  61 tablet  5   No current facility-administered medications for this visit.    BP 144/60  Pulse 74  Ht 4\' 11"  (1.499 m)  Wt 126.916 kg (279 lb 12.8 oz)  BMI 56.48 kg/m2 General: NAD, obese Mom present Neck: Thick, JVP 11-12, no thyromegaly or thyroid nodule.  Lungs: Clear to auscultation bilaterally with normal respiratory effort. CV: Nondisplaced PMI.  Heart regular S1/S2, no S3/S4, 2/6 SEM RUSB.  1+ edema to knees with venous stasis changes in the lower legs.  No carotid bruit.  Abdomen: Soft, nontender, no hepatosplenomegaly, no distention.  Neurologic: Alert and oriented x 3.  Psych: Normal affect. Extremities: No clubbing/cyanosis    Assessment/Plan: 1. Chronic diastolic CHF:  Unfortunately, she has been off metolazone since 12/14 due to a miscommunication.  Weight is up 24 lbs.  NYHA class IIIb symptoms.   - Continue Coreg as there is still a mid-cavity gradient present on echo (mild compared to past).   - Continue  torsemide 80 mg twice a day and KCl at current doses. - I will have her take metolazone 2.5 mg daily 30 minutes before torsemide for 3 days in a row.  After this, she will take metolazone twice weekly on Tuesday and Saturday.  - Check BMET today and again in 2 wks.   - Return in 3 wks.  2. Pulmonary: Suspect OHS/OSA.   - Intolerant CPAP.    - Continue home oxygen.   - She has  pulmonary followup (Dr. Elsworth Soho). 3. Renal: Check BMET today and again in 2 wks with restarting metolazone.  Loralie Champagne 05/01/2013

## 2013-05-02 ENCOUNTER — Encounter: Payer: Self-pay | Admitting: Cardiology

## 2013-05-06 ENCOUNTER — Encounter: Payer: Self-pay | Admitting: Pulmonary Disease

## 2013-05-06 ENCOUNTER — Ambulatory Visit (INDEPENDENT_AMBULATORY_CARE_PROVIDER_SITE_OTHER): Payer: Medicare Other | Admitting: Pulmonary Disease

## 2013-05-06 VITALS — BP 112/52 | HR 67 | Temp 98.2°F | Ht 59.0 in | Wt 268.0 lb

## 2013-05-06 DIAGNOSIS — G4730 Sleep apnea, unspecified: Secondary | ICD-10-CM | POA: Diagnosis not present

## 2013-05-06 DIAGNOSIS — I5032 Chronic diastolic (congestive) heart failure: Secondary | ICD-10-CM | POA: Diagnosis not present

## 2013-05-06 NOTE — Assessment & Plan Note (Signed)
continue zaroxlyn/ toresemide

## 2013-05-06 NOTE — Assessment & Plan Note (Signed)
CPAP intolerant inspite of trying various settings OK to dc cpap Stay on 24h O2

## 2013-05-06 NOTE — Progress Notes (Signed)
Subjective:    Patient ID: Melissa Myers, female    DOB: 12-26-1945, 68 y.o.   MRN: [id number]  HPI  PCP - Asa Lente   67/F never smoker, diabetic , hypertensive for FU of dyspnea & OSA.  She reports dyspnea on exertion x 2 year . She reprots pedal edema x few weeks & sleeps in a recliner ? orthopnea. Mother has noted loud snoring but no witnessed apneas or choking/ gasping episodes. She uses an albuterol inhaler as needed & reports occ wheezing , no childhood h/o asthma.  She has restless legs x 10 years & requip makes her sleepy. Epworth Sleepiness Score is reported as 24 (but she states this is due to requip). bedtime is midnight, sleep latenyc is variable & sometimes she takes 5 mg ambien (1/2 tab)  BNP 23, nml renal function, TSH nml  Echo in 04/2009 showing moderate LVH and preserved LV systolic function. However, there was a very large LV mid-cavity gradient with valsalva. Patient developed quite significant exertional dyspnea to the point where she was short of breath walking around her house. ETT-myoview was negative for ischemia or infarction. Right heart cath showed mildly elevated right heart filling pressures but normal PA pressure and normal PCWP. She was started on torsemide to replace Lasix, on high doses BUN cr rose to 83/3.0 , hence cut down to 50 mg .Repeat echo in 11/12 with normal EF. No significant LV mid-cavity gradient was described. V/Q scan was negative for PE 12/2010  Reviewed PSG (on requip & ambien)>> mild obstructive sleep apnea , predominnat hypopneas in supine position, lowest destauration 80%, < 1 min with satn < 88%. TST 350 mins, 39 mins of REM. No PLMs   sleep study 08/06/12.  There were 9 obstructive apneas, 0 central apnea, 0 mixed apneas, and 31 hypopneas with an apnea-hypopnea index of 6 events per hour. Few RERAs were noted with an RDI of 12 events per hour. The longest hypopnea was 45 seconds. Desaturation index was 3 events per hour. The lowest  desaturation was 87%. Note that this study was performed with 2 L of oxygen.      05/06/2013   Chief Complaint  Patient presents with  . Follow-up    Pt reports she does not use CPAP any longer. she tried to use it. She uses 2 liters O2 at bedtime   She was only using avg 2h - she has to use >4h, if not CPAP will be taken away   changed to auto4-10 cm >> After trying various settings,CPAP dc'd She is compliant with O2, sleeps in her recliner Misunderstood instructions & stopped zaroxlyn for a while, wt is back down after restarting    Review of Systems neg for any significant sore throat, dysphagia, itching, sneezing, nasal congestion or excess/ purulent secretions, fever, chills, sweats, unintended wt loss, pleuritic or exertional cp, hempoptysis, orthopnea pnd or change in chronic leg swelling. Also denies presyncope, palpitations, heartburn, abdominal pain, nausea, vomiting, diarrhea or change in bowel or urinary habits, dysuria,hematuria, rash, arthralgias, visual complaints, headache, numbness weakness or ataxia.     Objective:   Physical Exam  Gen. Pleasant, obese, in no distress ENT - no lesions, no post nasal drip Neck: No JVD, no thyromegaly, no carotid bruits Lungs: no use of accessory muscles, no dullness to percussion, decreased without rales or rhonchi  Cardiovascular: Rhythm regular, heart sounds  normal, no murmurs or gallops, no peripheral edema Musculoskeletal: No deformities, no cyanosis or clubbing , no tremors  Assessment & Plan:

## 2013-05-06 NOTE — Patient Instructions (Signed)
Stay on oxygen 2L/min x 24 h

## 2013-05-08 ENCOUNTER — Other Ambulatory Visit: Payer: Self-pay | Admitting: Cardiology

## 2013-05-15 ENCOUNTER — Other Ambulatory Visit: Payer: Medicare Other

## 2013-05-16 ENCOUNTER — Encounter: Payer: Self-pay | Admitting: Internal Medicine

## 2013-05-16 ENCOUNTER — Ambulatory Visit (INDEPENDENT_AMBULATORY_CARE_PROVIDER_SITE_OTHER): Payer: Medicare Other | Admitting: Internal Medicine

## 2013-05-16 VITALS — BP 120/72 | HR 67 | Temp 98.5°F | Wt 266.4 lb

## 2013-05-16 DIAGNOSIS — B351 Tinea unguium: Secondary | ICD-10-CM

## 2013-05-16 DIAGNOSIS — M545 Low back pain, unspecified: Secondary | ICD-10-CM

## 2013-05-16 DIAGNOSIS — E1029 Type 1 diabetes mellitus with other diabetic kidney complication: Secondary | ICD-10-CM

## 2013-05-16 DIAGNOSIS — F3289 Other specified depressive episodes: Secondary | ICD-10-CM

## 2013-05-16 DIAGNOSIS — G609 Hereditary and idiopathic neuropathy, unspecified: Secondary | ICD-10-CM | POA: Diagnosis not present

## 2013-05-16 DIAGNOSIS — E1065 Type 1 diabetes mellitus with hyperglycemia: Secondary | ICD-10-CM

## 2013-05-16 DIAGNOSIS — F329 Major depressive disorder, single episode, unspecified: Secondary | ICD-10-CM

## 2013-05-16 DIAGNOSIS — E669 Obesity, unspecified: Secondary | ICD-10-CM

## 2013-05-16 DIAGNOSIS — F32A Depression, unspecified: Secondary | ICD-10-CM

## 2013-05-16 MED ORDER — OXYCODONE-ACETAMINOPHEN 5-325 MG PO TABS: 1.0000 | ORAL_TABLET | Freq: Four times a day (QID) | ORAL | Status: DC | PRN

## 2013-05-16 MED ORDER — PREGABALIN 50 MG PO CAPS: 50.0000 mg | ORAL_CAPSULE | Freq: Three times a day (TID) | ORAL | Status: DC

## 2013-05-16 NOTE — Assessment & Plan Note (Signed)
Neuropathic leg pain - severe neuropathy per reports from [facility] - 11/2012 changed gabapentin to low dose lyrica (prior 50mg  05/2012 caused terrible side effects) As tolerating low dose >1 year but continued pain, will titrate back to 50 TID now continue BZ prn as this also helps neuropathy symptoms

## 2013-05-16 NOTE — Assessment & Plan Note (Signed)
No neuro deficits on exam -  Chronic DDD 05/2010 and facet mediated pain -prior ortho eval for same continue cymbalta, tramadol, gabapentin and prn percocet (preferred to norco) -  avoiding NSAIDs due to lymphedema

## 2013-05-16 NOTE — Progress Notes (Signed)
Pre visit review using our clinic review tool, if applicable. No additional management support is needed unless otherwise documented below in the visit note. 

## 2013-05-16 NOTE — Progress Notes (Signed)
Subjective:    Patient ID: Melissa Myers, female    DOB: 17-Jun-1945, 68 y.o.   MRN: [id number]  HPI  Patient is here for follow up  Reviewed chronic medical issues and interval medical events  Past Medical History  Diagnosis Date  . Type II or unspecified type diabetes mellitus without mention of complication, not stated as uncontrolled     insulin dep  . Hyperlipidemia     hx rhabdo on statins  . Hypertension   . CHF (congestive heart failure)     diastolic  . Chronic diarrhea     a/w nausea - felt related to IBS  . RLS (restless legs syndrome)   . Osteoarthritis   . Stasis dermatitis   . GERD (gastroesophageal reflux disease)   . On home oxygen therapy     uses oxygen 2 liters min per  at night and prn during day  . Anemia   . Neuropathy     feet, toes and fingers  . Torn ligament     left knee for last 6 months  . Disc degeneration, lumbar   . OSA (obstructive sleep apnea)     05/2009 sleep study - refuses CPAP  . Deaf     left side only    Review of Systems  Constitutional: Positive for fatigue. Negative for fever and unexpected weight change.  Eyes: Positive for discharge, redness and itching. Negative for pain.  Respiratory: Positive for shortness of breath. Negative for cough and wheezing.   Cardiovascular: Positive for leg swelling. Negative for chest pain and palpitations.       Objective:   Physical Exam  BP 120/72  Pulse 67  Temp(Src) 98.5 F (36.9 C) (Oral)  Wt 266 lb 6.4 oz (120.838 kg)  SpO2 94% Wt Readings from Last 3 Encounters:  05/16/13 266 lb 6.4 oz (120.838 kg)  05/06/13 268 lb (121.564 kg)  05/01/13 279 lb 12.8 oz (126.916 kg)   Constitutional: She is overweight; appears well-developed and well-nourished. No distress. mom and spouse at side Neck: Thick; Normal range of motion. Neck supple. No JVD present. No thyromegaly present.  Cardiovascular: Normal rate, regular rhythm and normal heart sounds.  No murmur heard. chronic  woody changes with 1+ BLE edema Pulmonary/Chest: Effort normal and breath sounds diminished at bases bilaterally. No respiratory distress. She has no wheezes.  Skin: chronic thickening and min erythema of distal BLE. Bilateral stasis dermatitis without change - no pustules or current ulceration Psychiatric: She has a dysthymic mood and affect. Her behavior is normal. Judgment and thought content normal.   Lab Results  Component Value Date   WBC 9.5 09/20/2012   HGB 10.6* 09/20/2012   HCT 32.7* 09/20/2012   PLT 307.0 09/20/2012   GLUCOSE 92 05/01/2013   CHOL 221* 08/03/2012   TRIG 188.0* 08/03/2012   HDL 54.80 08/03/2012   LDLDIRECT 127.5 08/03/2012   LDLCALC 115 12/02/2009   ALT 19 07/11/2012   AST 26 07/11/2012   NA 140 05/01/2013   K 4.0 05/01/2013   CL 101 05/01/2013   CREATININE 0.9 05/01/2013   BUN 31* 05/01/2013   CO2 31 05/01/2013   TSH 1.59 07/24/2012   INR 1.10 07/11/2012   HGBA1C 7.7* 04/29/2013   MICROALBUR 1.3 10/24/2012    Mr Brain W Wo Contrast  11/27/2012   CLINICAL DATA:  68 year old female with left-sided hearing loss. Sudden hearing loss.  EXAM: MRI HEAD WITHOUT AND WITH CONTRAST  TECHNIQUE: Multiplanar, multiecho pulse sequences of  the brain and surrounding structures were obtained according to standard protocol without and with intravenous contrast  CONTRAST:  20 mL MultiHance.  COMPARISON:  None.  FINDINGS: Study is the intermittently degraded by motion artifact despite sedation and repeated imaging attempts.  Cerebral volume is normal. No restricted diffusion to suggest acute infarction. No midline shift, mass effect, evidence of mass lesion, ventriculomegaly, extra-axial collection or acute intracranial hemorrhage. Cervicomedullary junction and pituitary are within normal limits. Negative visualized cervical spine. Major intracranial vascular flow voids are preserved. Pearline Cables and white matter signal appears within normal limits throughout the brain.  Visualized orbit soft tissues are  within normal limits. Paranasal sinuses are clear. Normal bone marrow signal. Negative scalp soft tissues.  Dedicated internal auditory canal imaging. Normal cerebellopontine angles. Grossly normal bilateral cisternal and intracanalicular 7th and 8th cranial nerves segments. Mastoids are clear. No abnormal enhancement identified.  IMPRESSION: Mildly motion degraded despite repeated imaging attempts. No IAC abnormality identified. Negative MRI appearance of the brain.   Electronically Signed   By: Lars Pinks M.D.   On: 11/27/2012 14:21       Assessment & Plan:   Problem List Items Addressed This Visit   BACK PAIN, LUMBAR, CHRONIC     No neuro deficits on exam -  Chronic DDD 05/2010 and facet mediated pain -prior ortho eval for same continue cymbalta, tramadol, gabapentin and prn percocet (preferred to norco) -  avoiding NSAIDs due to lymphedema    Relevant Medications      oxyCODONE-acetaminophen (PERCOCET/ROXICET) 5-325 MG per tablet   Depression     Chronic symptoms - resistant to medication tx Previously on celexa 2012-2013 but self DC'd same began cymbalta 12/2012 - doing well with improvement in mood and chronic pain symptoms -  Continue same    OBESITY      Wt Readings from Last 3 Encounters:  05/16/13 266 lb 6.4 oz (120.838 kg)  05/06/13 268 lb (121.564 kg)  05/01/13 279 lb 12.8 oz (126.916 kg)   The patient is asked to make an attempt to improve diet and exercise patterns to aid in medical management of this problem.     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

## 2013-05-16 NOTE — Assessment & Plan Note (Signed)
Chronic symptoms - resistant to medication tx Previously on celexa 2012-2013 but self DC'd same began cymbalta 12/2012 - doing well with improvement in mood and chronic pain symptoms -  Continue same

## 2013-05-16 NOTE — Assessment & Plan Note (Signed)
Wt Readings from Last 3 Encounters:  05/16/13 266 lb 6.4 oz (120.838 kg)  05/06/13 268 lb (121.564 kg)  05/01/13 279 lb 12.8 oz (126.916 kg)   The patient is asked to make an attempt to improve diet and exercise patterns to aid in medical management of this problem.

## 2013-05-16 NOTE — Patient Instructions (Addendum)
It was good to see you today.  We have reviewed your prior records including labs and tests today  Medications reviewed and updated  Increase Lyrica to 50mg  3 times daily No other changes  Your prescription(s) have been given to you to submitted to your pharmacy. Please take as directed and contact our office if you believe you are having problem(s) with the medication(s).  we'll make referral to podiatry for your feet. Our office will contact you regarding appointment(s) once made.  Please schedule followup in 4 months, call sooner if problems.  Exercise to Lose Weight Exercise and a healthy diet may help you lose weight. Your doctor may suggest specific exercises. EXERCISE IDEAS AND TIPS  Choose low-cost things you enjoy doing, such as walking, bicycling, or exercising to workout videos.  Take stairs instead of the elevator.  Walk during your lunch break.  Park your car further away from work or school.  Go to a gym or an exercise class.  Start with 5 to 10 minutes of exercise each day. Build up to 30 minutes of exercise 4 to 6 days a week.  Wear shoes with good support and comfortable clothes.  Stretch before and after working out.  Work out until you breathe harder and your heart beats faster.  Drink extra water when you exercise.  Do not do so much that you hurt yourself, feel dizzy, or get very short of breath. Exercises that burn about 150 calories:  Running 1  miles in 15 minutes.  Playing volleyball for 45 to 60 minutes.  Washing and waxing a car for 45 to 60 minutes.  Playing touch football for 45 minutes.  Walking 1  miles in 35 minutes.  Pushing a stroller 1  miles in 30 minutes.  Playing basketball for 30 minutes.  Raking leaves for 30 minutes.  Bicycling 5 miles in 30 minutes.  Walking 2 miles in 30 minutes.  Dancing for 30 minutes.  Shoveling snow for 15 minutes.  Swimming laps for 20 minutes.  Walking up stairs for 15  minutes.  Bicycling 4 miles in 15 minutes.  Gardening for 30 to 45 minutes.  Jumping rope for 15 minutes.  Washing windows or floors for 45 to 60 minutes. Document Released: 03/12/2010 Document Revised: 05/02/2011 Document Reviewed: 03/12/2010 [facility] Patient Information 2014 [place], [place].

## 2013-05-20 ENCOUNTER — Other Ambulatory Visit: Payer: Self-pay | Admitting: Endocrinology

## 2013-05-21 ENCOUNTER — Other Ambulatory Visit: Payer: Self-pay

## 2013-05-21 MED ORDER — INSULIN GLULISINE 100 UNIT/ML IJ SOLN: INTRAMUSCULAR | Status: DC

## 2013-05-23 ENCOUNTER — Encounter: Payer: Self-pay | Admitting: *Deleted

## 2013-05-28 ENCOUNTER — Ambulatory Visit (INDEPENDENT_AMBULATORY_CARE_PROVIDER_SITE_OTHER): Payer: Medicare Other

## 2013-05-28 ENCOUNTER — Telehealth: Payer: Self-pay

## 2013-05-28 VITALS — BP 115/45 | HR 67 | Resp 16 | Ht 59.0 in | Wt 266.0 lb

## 2013-05-28 DIAGNOSIS — L988 Other specified disorders of the skin and subcutaneous tissue: Secondary | ICD-10-CM

## 2013-05-28 DIAGNOSIS — E1149 Type 2 diabetes mellitus with other diabetic neurological complication: Secondary | ICD-10-CM

## 2013-05-28 DIAGNOSIS — E1142 Type 2 diabetes mellitus with diabetic polyneuropathy: Secondary | ICD-10-CM

## 2013-05-28 DIAGNOSIS — R609 Edema, unspecified: Secondary | ICD-10-CM

## 2013-05-28 DIAGNOSIS — E1140 Type 2 diabetes mellitus with diabetic neuropathy, unspecified: Secondary | ICD-10-CM

## 2013-05-28 DIAGNOSIS — L608 Other nail disorders: Secondary | ICD-10-CM

## 2013-05-28 DIAGNOSIS — Q828 Other specified congenital malformations of skin: Secondary | ICD-10-CM

## 2013-05-28 DIAGNOSIS — R234 Changes in skin texture: Secondary | ICD-10-CM

## 2013-05-28 NOTE — Telephone Encounter (Signed)
Relevant patient education assigned to patient using Emmi. ° °

## 2013-05-28 NOTE — Patient Instructions (Signed)
Diabetes and Foot Care Diabetes may cause you to have problems because of poor blood supply (circulation) to your feet and legs. This may cause the skin on your feet to become thinner, break easier, and heal more slowly. Your skin may become dry, and the skin may peel and crack. You may also have nerve damage in your legs and feet causing decreased feeling in them. You may not notice minor injuries to your feet that could lead to infections or more serious problems. Taking care of your feet is one of the most important things you can do for yourself.  HOME CARE INSTRUCTIONS  Wear shoes at all times, even in the house. Do not go barefoot. Bare feet are easily injured.  Check your feet daily for blisters, cuts, and redness. If you cannot see the bottom of your feet, use a mirror or ask someone for help.  Wash your feet with warm water (do not use hot water) and mild soap. Then pat your feet and the areas between your toes until they are completely dry. Do not soak your feet as this can dry your skin.  Apply a moisturizing lotion or petroleum jelly (that does not contain alcohol and is unscented) to the skin on your feet and to dry, brittle toenails. Do not apply lotion between your toes.  Trim your toenails straight across. Do not dig under them or around the cuticle. File the edges of your nails with an emery board or nail file.  Do not cut corns or calluses or try to remove them with medicine.  Wear clean socks or stockings every day. Make sure they are not too tight. Do not wear knee-high stockings since they may decrease blood flow to your legs.  Wear shoes that fit properly and have enough cushioning. To break in new shoes, wear them for just a few hours a day. This prevents you from injuring your feet. Always look in your shoes before you put them on to be sure there are no objects inside.  Do not cross your legs. This may decrease the blood flow to your feet.  If you find a minor scrape,  cut, or break in the skin on your feet, keep it and the skin around it clean and dry. These areas may be cleansed with mild soap and water. Do not cleanse the area with peroxide, alcohol, or iodine.  When you remove an adhesive bandage, be sure not to damage the skin around it.  If you have a wound, look at it several times a day to make sure it is healing.  Do not use heating pads or hot water bottles. They may burn your skin. If you have lost feeling in your feet or legs, you may not know it is happening until it is too late.  Make sure your health care provider performs a complete foot exam at least annually or more often if you have foot problems. Report any cuts, sores, or bruises to your health care provider immediately. SEEK MEDICAL CARE IF:   You have an injury that is not healing.  You have cuts or breaks in the skin.  You have an ingrown nail.  You notice redness on your legs or feet.  You feel burning or tingling in your legs or feet.  You have pain or cramps in your legs and feet.  Your legs or feet are numb.  Your feet always feel cold. SEEK IMMEDIATE MEDICAL CARE IF:   There is increasing redness,   swelling, or pain in or around a wound.  There is a red line that goes up your leg.  Pus is coming from a wound.  You develop a fever or as directed by your health care provider.  You notice a bad smell coming from an ulcer or wound. Document Released: 02/05/2000 Document Revised: 10/10/2012 Document Reviewed: 07/17/2012 [employer] Patient Information 2014 [employer].  

## 2013-05-28 NOTE — Progress Notes (Signed)
   Subjective:    Patient ID: Melissa Myers, female    DOB: 09/26/1945, 68 y.o.   MRN: [id number]  HPI Comments: "My feet are bad"  Patient c/o burning, tingling, and numbness in both feet for about 6 months. She states that her feet have been uncomfortable for years but worsened recently. Worse at night.  She doesn't sleep. They are swollen. She has callused areas plantar heels and forefoot. They crack open occasionally. Her husbands rubs them which helps come. Her toenails are thickened and discolored. She saw Dr. Asa Lente and referred here.     Review of Systems  HENT: Positive for hearing loss, sore throat and tinnitus.   Respiratory: Positive for shortness of breath.   Cardiovascular: Positive for leg swelling.  Musculoskeletal: Positive for arthralgias, back pain, gait problem and myalgias.  Skin:       Change in nails   Neurological: Positive for dizziness, weakness and numbness.  All other systems reviewed and are negative.       Objective:   Physical Exam Neurovascular status is diminished pedal pulses palpable DP thready one over 4 PT nonpalpable due to +2+3 pitting edema both lower extremities and ankles. There is venous stasis type dermatitis hyper erythema and edema the ankles and dorsal foot bilateral. Patient also is paresthesias as a result of both diabetic neuropathy as well as venous stasis. Nails thick brittle crumbly friable discolored 1 through 5 bilateral painful tender symptomatic and debridement. There is also dry skin pinch callus the hallux dry tissue skin medial lateral heel areas bilateral lateral fifth metatarsal base right. No secondary infection is no active ulcers although history of ulceration is noted. Patient is a rigid digital contractures. Normal plantar response DTRs not elicited there is decreased epicritic sensation Semmes Weinstein testing to forefoot digits arch and ankle areas.       Assessment & Plan:  Assessment diabetes with  peripheral neuropathy significant edema venous stasis dermatitis and venous insufficiency. Patient also has dystrophic A nails which are debrided x10 and debridement multiple keratoses on the presence of diabetes and complications with neuropathy to. Patient also has significant edema which she has compression stockings has not used them it had difficult time tolerating also some or socks with her shoes and only wearing slippers and otherwise barefoot having dry fissured skin is result of his regular lotion to skin daily reappointed 3 months for diabetic foot and palliative nail care in the future and stressed the importance of trying to restart using compression stockings as instructed patient is also taking Lyrica for her peripheral neuropathy she is only just recently switched up to 50 mg 3 times a day precision 25 3 times a day I do feel heris to go from that point of followup with in 3 months for continued palliative care next if she started using compression stockings the swelling is gone down maybe be candidate for diabetic accident shoes we'll get authorization the future.  Harriet Masson DPM

## 2013-05-29 ENCOUNTER — Ambulatory Visit (INDEPENDENT_AMBULATORY_CARE_PROVIDER_SITE_OTHER): Payer: Medicare Other | Admitting: Cardiology

## 2013-05-29 ENCOUNTER — Encounter: Payer: Self-pay | Admitting: Cardiology

## 2013-05-29 VITALS — BP 148/68 | HR 78 | Ht 59.0 in | Wt 283.0 lb

## 2013-05-29 DIAGNOSIS — N259 Disorder resulting from impaired renal tubular function, unspecified: Secondary | ICD-10-CM | POA: Diagnosis not present

## 2013-05-29 DIAGNOSIS — R0602 Shortness of breath: Secondary | ICD-10-CM

## 2013-05-29 DIAGNOSIS — I5032 Chronic diastolic (congestive) heart failure: Secondary | ICD-10-CM | POA: Diagnosis not present

## 2013-05-29 DIAGNOSIS — I10 Essential (primary) hypertension: Secondary | ICD-10-CM

## 2013-05-29 LAB — BASIC METABOLIC PANEL
BUN: 28 mg/dL — AB (ref 6–23)
CHLORIDE: 98 meq/L (ref 96–112)
CO2: 34 mEq/L — ABNORMAL HIGH (ref 19–32)
CREATININE: 0.9 mg/dL (ref 0.4–1.2)
Calcium: 9.3 mg/dL (ref 8.4–10.5)
GFR: 70.71 mL/min (ref >60.00)
Glucose, Bld: 76 mg/dL (ref 70–99)
Potassium: 3.7 mEq/L (ref 3.5–5.1)
Sodium: 141 mEq/L (ref 135–145)

## 2013-05-29 LAB — BRAIN NATRIURETIC PEPTIDE: Pro B Natriuretic peptide (BNP): 19 pg/mL (ref 0.0–100.0)

## 2013-05-29 MED ORDER — METOLAZONE 2.5 MG PO TABS: ORAL_TABLET | ORAL | Status: DC

## 2013-05-29 NOTE — Patient Instructions (Signed)
Increase metolazone (zaroxlyn) to 2.5mg  three times a week on Tuesdays, Thursdays and Saturdays 30 minutes before your morning torsemide.   Your physician recommends that you have  lab work today--BMET/BNP.  Your physician recommends that you return for lab work in: 2 weeks--BMET/BNP.  Your physician recommends that you schedule a follow-up appointment in: 1 month with Dr Aundra Dubin.

## 2013-05-29 NOTE — Progress Notes (Signed)
Patient ID: Melissa Myers, female   DOB: 12-Aug-1945, 68 y.o.   MRN: [id number] PCP: Dr. Asa Lente  68 yo with history of HTN, DM, hyperlipidemia, OHS/OSA, and chronic dyspnea/diastolic CHF returns for cardiac evaluation. Patient had an echo in 3/11 showing moderate LVH and preserved LV systolic function. However, there was a very large LV mid-cavity gradient with valsalva.  Patient developed quite significant exertional dyspnea to the point where she was short of breath walking around her house. She had a pulmonary evaluation with Dr. Elsworth Soho but no primary lung problems were identified. Patient had an ETT-myoview in 4/11 which was negative for ischemia or infarction. Right heart cath in 4/11 showed mildly elevated right heart filling pressures but normal PA pressure and normal PCWP. I started her on a beta blocker (Coreg) to try to lower her LV mid-cavity gradient (that likely occurs with exertion) and to better control BP.  V/Q scan was negative for PE.  PFTs from 5/13 showed a restrictive defect. She had a recent repeat echo in 4/14 with normal EF, mild mid-cavity LV gradient.     At last appointment, Melissa Myers had stopped her metolazone in December and weight was up about 24 lbs. She was short of breath after walking 20-30 feet.  Lower extremity edema was worse.  I started her back on her metolazone twice a week.  Unfortunately, weight is up 4 lbs again. She did miss her last 2 doses of metolazone.  She is still short of breath walking a short distance, improved after restarting metolazone.    Labs (4/11): TGs 539, HDL 58, LDL 128, K 5.2, creatinine 1.3, TSH normal, BNP 14  Labs (5/11): K 3.9, creatinine 1.0  Labs (9/11): creatinine 1.1, BUN 43, LDL 145, HDL 60  Labs (10/11): K 4.3, creatinine 0.81, HCT 34, LDL 115, HDL 75  Labs (11/12): K 4.2, creatinine 1.58 => 3.0, HCT 32.2, BNP 172, LDL 97, TGs 980 Labs (12/12): K 3.4, creatinine 1.2 => 1.7 Labs (1/13): K 3.9, creatinine 1.1, TGs 281, LDL  121 Labs (2/13): K 3.3, creatinine 1.2 Labs (10/13): K 4.1, creatinine 0.85 Labs (4/14): K 3.7, creatinine 1.2, BUN 43, BNP 23 Labs (5/14): K 4.1, creatinine 1.0 Labs (7/14): K 4.6 Creatinine 1.0 BNP 39.0  Labs (8/14): K 3.5, creatinine 0.91, BNP 71 Labs (11/14): K 3.7, creatinine 0.9 Labs (12/14): K 4.1, creatinine 0.8, BNP 52 Labs (3/15): K 4, creatinine 0.9  Allergies (verified):  1) ! Sulfa  2) Morphine   Past Medical History:  1. Diabetes mellitus, type II  2. Hyperlipidemia: She has been unable to tolerate statins (has been on Vytorin, lovastatin, and Crestor) due to what sounds like rhabdo: developed muscle weakness and was told she had "muscle damage" with each of these statins. She has been told not to take statins anymore.  3. Hypertension 4. Obesity  5. Restless Leg Syndrome  6. OA  7. peripheral neuropathy  8. OHS/OSA: Uses supplemental oxygen, does not tolerate CPAP.   9. Chronic nausea/diarrhea: ? IBS  10. Diastolic CHF: Echo (4/69) with normal LV size, moderate LVH, EF 65-70%, LV mid-cavity gradient reaching 63 mmHg with valsalva but minimal at rest, grade I diastolic dysfunction, cannot estimate PA systolic pressure (no TR doppler signal), RV normal. RHC (4/11): Mean RA 11, RV 37/13, PA 33/15, mean PCWP 12, CI 3.3. Echo in 11/12 showed mild LVH, EF 65-70%, no LV mid-cavity gradient was mentioned.  Echo in 4/14 showed EF 65-70%, mild LVH, grade I diastolic dysfunction,  PA systolic pressure 35 mmHg, mild LV mid-cavity gradient.  11. ETT-myoview (4/11): 5'30", stopped due to fatigue, normal EF, no evidence for scar or ischemia. 12. V/Q scan 11/12: negative for PE.  Lower extremity venous doppler US (3/14): negative for DVT.  13. PFTs (5/13): FVC 50%, FEV1 62%, ratio 106%, DLCO 69%, TLC 69%.   Family History:  Family History of Alcoholism/Addiction (parent)  Family History Diabetes 1st degree relative (grandparent)  Family History High cholesterol (parent, grandparent)   Family History Hypertension (parent, grandparent)  Family History Lung cancer (grandparent)  Stomach cancer (grandmother)  Celiac Sprue daughter  Mother with MI at 52, father with MI at 29, uncle with MI at 59, brother with stents in his 38s, multiple aunts with MIs and CVAs   Social History:  Never Smoked  no alcohol  married, lives with spouse and her mother  retired Network engineer - now housewife  Alcohol Use - no  Illicit Drug Use - no  Review of Systems  All systems reviewed and negative except as per HPI.   ROS: All systems reviewed and negative except as per HPI.   Current Outpatient Prescriptions  Medication Sig Dispense Refill  . aspirin 81 MG tablet Take 81 mg by mouth daily.       . carvedilol (COREG) 25 MG tablet Take 37.5 mg by mouth 2 (two) times daily with a meal.      . Coenzyme Q10 (COQ10) 30 MG CAPS Take 1 capsule by mouth daily.       . DULoxetine (CYMBALTA) 20 MG capsule Take 1 capsule (20 mg total) by mouth daily.  30 capsule  3  . fenofibrate 160 MG tablet Take 160 mg by mouth every morning.      . hydrALAZINE (APRESOLINE) 25 MG tablet Take 1 tablet (25 mg total) by mouth 3 (three) times daily.  90 tablet  6  . insulin glargine (LANTUS) 100 UNIT/ML injection Inject 50 Units into the skin at bedtime.       . insulin glulisine (APIDRA) 100 UNIT/ML injection Inject 60 units with breakfast, 60 units with lunch and 70 units with dinner.  6 vial  3  . LORazepam (ATIVAN) 0.5 MG tablet Take 0.5 mg by mouth daily as needed for anxiety. Not currently taking      . Magnesium 200 MG TABS Take 1 tablet by mouth daily.   60 each    . NON FORMULARY Oxygen at home 2-4 L (Pt unsure of which one)      . omega-3 acid ethyl esters (LOVAZA) 1 G capsule Take 2 g by mouth 2 (two) times daily.      [name] oxyCODONE-acetaminophen (PERCOCET/ROXICET) 5-325 MG per tablet Take 1 tablet by mouth every 6 (six) hours as needed.  30 tablet  0  . potassium chloride (KLOR-CON 10) 10 MEQ tablet Take 4  tablets (40 mEq total) by mouth 3 (three) times daily.  360 tablet  3  . pregabalin (LYRICA) 50 MG capsule Take 1 capsule (50 mg total) by mouth 3 (three) times daily.  90 capsule  5  . rOPINIRole (REQUIP) 2 MG tablet Take 2 mg by mouth 2 (two) times daily.      [name] torsemide (DEMADEX) 20 MG tablet Take 80 mg by mouth 2 (two) times daily. Takes 4 tablets twice daily      . Insulin Syringe-Needle U-100 (BD INSULIN SYRINGE ULTRAFINE) 31G X 5/16" 1 ML MISC Inject insulin 3 times daily as instructed. Dx code: 44.43  100 each  5  . Insulin Syringe-Needle U-100 (INSULIN SYRINGE 1CC/30GX5/16") 30G X 5/16" 1 ML MISC USE TO INJECT INSULIN 4 TIMES DAILY AS INSTRUCTED  150 each  3  . metolazone (ZAROXOLYN) 2.5 MG tablet 1 tablet on Tues,Thurs, and Sat 30  minutes before your morning torsemide  30 tablet  1   No current facility-administered medications for this visit.    BP 148/68  Pulse 78  Ht 4\' 11"  (1.499 m)  Wt 128.368 kg (283 lb)  BMI 57.13 kg/m2 General: NAD, obese Mom present Neck: Thick, JVP 11-12, no thyromegaly or thyroid nodule.  Lungs: Clear to auscultation bilaterally with normal respiratory effort. CV: Nondisplaced PMI.  Heart regular S1/S2, no S3/S4, 2/6 SEM RUSB.  1+ edema to knees with venous stasis changes in the lower legs.  No carotid bruit.  Abdomen: Soft, nontender, no hepatosplenomegaly, no distention.  Neurologic: Alert and oriented x 3.  Psych: Normal affect. Extremities: No clubbing/cyanosis    Assessment/Plan: 1. Chronic diastolic CHF:  NYHA class IIIb symptoms, stable.  Weight is up again.  She missed her last 2 metolazone doses.  Volume is getting difficult to manage.  Creatinine has been stable.  - Continue Coreg as there is still a mid-cavity gradient present on echo (mild compared to past).   - Continue  torsemide 80 mg twice a day and KCl at current doses. - Increase metolazone to 2.5 mg three times a week, should take 30 minutes before am torsemide.   - Check  BMET/BNP today and again in 2 wks.   - Return in 1 month.  2. Pulmonary: Suspect OHS/OSA.   - Intolerant CPAP.    - Continue home oxygen.   - She has pulmonary followup (Dr. Elsworth Soho). 3. Renal: Check BMET today and again in 2 wks with restarting metolazone.  Larey Dresser 05/29/2013

## 2013-06-07 ENCOUNTER — Other Ambulatory Visit: Payer: Self-pay | Admitting: Cardiology

## 2013-06-11 ENCOUNTER — Other Ambulatory Visit: Payer: Self-pay | Admitting: Endocrinology

## 2013-06-12 ENCOUNTER — Other Ambulatory Visit: Payer: Medicare Other

## 2013-06-18 ENCOUNTER — Encounter: Payer: Self-pay | Admitting: Internal Medicine

## 2013-06-26 ENCOUNTER — Encounter: Payer: Self-pay | Admitting: *Deleted

## 2013-06-26 ENCOUNTER — Other Ambulatory Visit: Payer: Self-pay | Admitting: Cardiology

## 2013-07-01 ENCOUNTER — Encounter: Payer: Self-pay | Admitting: Cardiology

## 2013-07-01 ENCOUNTER — Ambulatory Visit (INDEPENDENT_AMBULATORY_CARE_PROVIDER_SITE_OTHER): Payer: Medicare Other | Admitting: Cardiology

## 2013-07-01 VITALS — BP 140/60 | HR 74 | Ht 59.0 in | Wt 285.0 lb

## 2013-07-01 DIAGNOSIS — I50812 Chronic right heart failure: Secondary | ICD-10-CM

## 2013-07-01 DIAGNOSIS — I10 Essential (primary) hypertension: Secondary | ICD-10-CM

## 2013-07-01 DIAGNOSIS — I509 Heart failure, unspecified: Secondary | ICD-10-CM

## 2013-07-01 DIAGNOSIS — I5032 Chronic diastolic (congestive) heart failure: Secondary | ICD-10-CM

## 2013-07-01 DIAGNOSIS — R0602 Shortness of breath: Secondary | ICD-10-CM | POA: Diagnosis not present

## 2013-07-01 LAB — BASIC METABOLIC PANEL
BUN: 32 mg/dL — ABNORMAL HIGH (ref 6–23)
CO2: 37 mEq/L — ABNORMAL HIGH (ref 19–32)
Calcium: 9.3 mg/dL (ref 8.4–10.5)
Chloride: 95 mEq/L — ABNORMAL LOW (ref 96–112)
Creatinine, Ser: 0.9 mg/dL (ref 0.4–1.2)
GFR: 69.74 mL/min (ref >60.00)
Glucose, Bld: 100 mg/dL — ABNORMAL HIGH (ref 70–99)
POTASSIUM: 3.8 meq/L (ref 3.5–5.1)
SODIUM: 141 meq/L (ref 135–145)

## 2013-07-01 LAB — BRAIN NATRIURETIC PEPTIDE: PRO B NATRI PEPTIDE: 29 pg/mL (ref 0.0–100.0)

## 2013-07-01 MED ORDER — TORSEMIDE 20 MG PO TABS: 100.0000 mg | ORAL_TABLET | Freq: Two times a day (BID) | ORAL | Status: DC

## 2013-07-01 NOTE — Patient Instructions (Signed)
Your physician has recommended you make the following change in your medication:   1. Increase Torsemide to 5 tablets twice a day.  Your physician recommends that you return for lab work today for BNP and BMET.  Your physician recommends that you return for lab work in 10 days on 07/11/13 for BMET.  Your physician recommends that you schedule a follow-up appointment in: 1 month with Dr. Aundra Dubin.

## 2013-07-01 NOTE — Progress Notes (Signed)
Patient ID: Melissa Myers, female   DOB: 1945/10/16, 68 y.o.   MRN: [id number] PCP: Dr. Asa Lente  68 yo with history of HTN, DM, hyperlipidemia, OHS/OSA, and chronic dyspnea/diastolic CHF returns for cardiac evaluation. Patient had an echo in 3/11 showing moderate LVH and preserved LV systolic function. However, there was a very large LV mid-cavity gradient with valsalva.  Patient developed quite significant exertional dyspnea to the point where she was short of breath walking around her house. She had a pulmonary evaluation with Dr. Elsworth Soho but no primary lung problems were identified. Patient had an ETT-myoview in 4/11 which was negative for ischemia or infarction. Right heart cath in 4/11 showed mildly elevated right heart filling pressures but normal PA pressure and normal PCWP. I started her on a beta blocker (Coreg) to try to lower her LV mid-cavity gradient (that likely occurs with exertion) and to better control BP.  V/Q scan was negative for PE.  PFTs from 5/13 showed a restrictive defect. Last echo in 4/14 with normal EF, mild mid-cavity LV gradient.     Symptomatically, [name] is stable.  Weight is up 2 lbs, however.  She can walk around her house without dypsnea.  She walked in the office today without dyspnea.  She uses a walker outside the house.  She is short of breath with stairs and longer distances.  No orthopnea, PND, or chest pain.  Her main complaint is painful neuropathic symptoms in her feet.  She is on Lyrica.  She is wearing oxygen at all times and is unable to tolerate CPAP.  Labs (4/11): TGs 539, HDL 58, LDL 128, K 5.2, creatinine 1.3, TSH normal, BNP 14  Labs (5/11): K 3.9, creatinine 1.0  Labs (9/11): creatinine 1.1, BUN 43, LDL 145, HDL 60  Labs (10/11): K 4.3, creatinine 0.81, HCT 34, LDL 115, HDL 75  Labs (11/12): K 4.2, creatinine 1.58 => 3.0, HCT 32.2, BNP 172, LDL 97, TGs 980 Labs (12/12): K 3.4, creatinine 1.2 => 1.7 Labs (1/13): K 3.9, creatinine 1.1, TGs  281, LDL 121 Labs (2/13): K 3.3, creatinine 1.2 Labs (10/13): K 4.1, creatinine 0.85 Labs (4/14): K 3.7, creatinine 1.2, BUN 43, BNP 23 Labs (5/14): K 4.1, creatinine 1.0 Labs (7/14): K 4.6 Creatinine 1.0 BNP 39.0  Labs (8/14): K 3.5, creatinine 0.91, BNP 71 Labs (11/14): K 3.7, creatinine 0.9 Labs (12/14): K 4.1, creatinine 0.8, BNP 52 Labs (3/15): K 4, creatinine 0.9 Labs (4/15): K 3.7, creatinine 0.9  Allergies (verified):  1) ! Sulfa  2) Morphine   Past Medical History:  1. Diabetes mellitus, type II  2. Hyperlipidemia: She has been unable to tolerate statins (has been on Vytorin, lovastatin, and Crestor) due to what sounds like rhabdo: developed muscle weakness and was told she had "muscle damage" with each of these statins. She has been told not to take statins anymore.  3. Hypertension 4. Obesity  5. Restless Leg Syndrome  6. OA  7. peripheral neuropathy  8. OHS/OSA: Uses supplemental oxygen, does not tolerate CPAP.   9. Chronic nausea/diarrhea: ? IBS  10. Diastolic CHF: Echo (1/91) with normal LV size, moderate LVH, EF 65-70%, LV mid-cavity gradient reaching 63 mmHg with valsalva but minimal at rest, grade I diastolic dysfunction, cannot estimate PA systolic pressure (no TR doppler signal), RV normal. RHC (4/11): Mean RA 11, RV 37/13, PA 33/15, mean PCWP 12, CI 3.3. Echo in 11/12 showed mild LVH, EF 65-70%, no LV mid-cavity gradient was mentioned.  Echo in  4/14 showed EF 65-70%, mild LVH, grade I diastolic dysfunction, PA systolic pressure 35 mmHg, mild LV mid-cavity gradient.  11. ETT-myoview (4/11): 5'30", stopped due to fatigue, normal EF, no evidence for scar or ischemia. 12. V/Q scan 11/12: negative for PE.  Lower extremity venous doppler US (3/14): negative for DVT.  13. PFTs (5/13): FVC 50%, FEV1 62%, ratio 106%, DLCO 69%, TLC 69%.   Family History:  Family History of Alcoholism/Addiction (parent)  Family History Diabetes 1st degree relative (grandparent)  Family  History High cholesterol (parent, grandparent)  Family History Hypertension (parent, grandparent)  Family History Lung cancer (grandparent)  Stomach cancer (grandmother)  Celiac Sprue daughter  Mother with MI at 17, father with MI at 56, uncle with MI at 60, brother with stents in his 48s, multiple aunts with MIs and CVAs   Social History:  Never Smoked  no alcohol  married, lives with spouse and her mother  retired Network engineer - now housewife  Alcohol Use - no  Illicit Drug Use - no  Review of Systems  All systems reviewed and negative except as per HPI.   ROS: All systems reviewed and negative except as per HPI.   Current Outpatient Prescriptions  Medication Sig Dispense Refill  . aspirin 81 MG tablet Take 81 mg by mouth daily.       . carvedilol (COREG) 25 MG tablet TAKE 1 1/2 TABLETS BY MOUTH TWICE DAILY  90 tablet  0  . Coenzyme Q10 (COQ10) 30 MG CAPS Take 1 capsule by mouth daily.       . DULoxetine (CYMBALTA) 20 MG capsule Take 1 capsule (20 mg total) by mouth daily.  30 capsule  3  . fenofibrate 160 MG tablet Take 160 mg by mouth every morning.      . hydrALAZINE (APRESOLINE) 25 MG tablet Take 1 tablet (25 mg total) by mouth 3 (three) times daily.  90 tablet  6  . insulin glulisine (APIDRA) 100 UNIT/ML injection Inject 60 units with breakfast, 60 units with lunch and 70 units with dinner.  6 vial  3  . Insulin Syringe-Needle U-100 (BD INSULIN SYRINGE ULTRAFINE) 31G X 5/16" 1 ML MISC Inject insulin 3 times daily as instructed. Dx code: 250.43  100 each  5  . Insulin Syringe-Needle U-100 (INSULIN SYRINGE 1CC/30GX5/16") 30G X 5/16" 1 ML MISC USE TO INJECT INSULIN 4 TIMES DAILY AS INSTRUCTED  150 each  3  . LANTUS 100 UNIT/ML injection INJECT 60 UNITS EVERY NIGHT AT BEDTIME  30 mL  0  . LORazepam (ATIVAN) 0.5 MG tablet Take 0.5 mg by mouth daily as needed for anxiety. Not currently taking      . Magnesium 200 MG TABS Take 1 tablet by mouth daily.   60 each    . metolazone  (ZAROXOLYN) 2.5 MG tablet 1 tablet on Tues,Thurs, and Sat 30  minutes before your morning torsemide  30 tablet  1  . NON FORMULARY Oxygen at home 2-4 L (Pt unsure of which one)      . omega-3 acid ethyl esters (LOVAZA) 1 G capsule TAKE 2 CAPSULES BY MOUTH TWICE DAILY  180 capsule  0  . oxyCODONE-acetaminophen (PERCOCET/ROXICET) 5-325 MG per tablet Take 1 tablet by mouth every 6 (six) hours as needed.  30 tablet  0  . potassium chloride (K-DUR) 10 MEQ tablet TAKE 4 TABLETS BY MOUTH THREE TIMES DAILY  360 tablet  0  . pregabalin (LYRICA) 50 MG capsule Take 1 capsule (50 mg total) by  mouth 3 (three) times daily.  90 capsule  5  . rOPINIRole (REQUIP) 2 MG tablet Take 2 mg by mouth 2 (two) times daily.      [name] torsemide (DEMADEX) 20 MG tablet Take 80 mg by mouth 2 (two) times daily. Takes 4 tablets twice daily       No current facility-administered medications for this visit.    BP 140/60  Pulse 74  Ht 4\' 11"  (1.499 m)  Wt 129.275 kg (285 lb)  BMI 57.53 kg/m2 General: NAD, obese  Neck: Thick, JVP 8-9, no thyromegaly or thyroid nodule.  Lungs: Clear to auscultation bilaterally with normal respiratory effort. CV: Nondisplaced PMI.  Heart regular S1/S2, no S3/S4, 2/6 SEM RUSB.  1+ edema to knees with venous stasis changes in the lower legs.  No carotid bruit.  Abdomen: Soft, nontender, no hepatosplenomegaly, no distention.  Neurologic: Alert and oriented x 3.  Psych: Normal affect. Extremities: No clubbing/cyanosis    Assessment/Plan: 1. Chronic diastolic CHF:  NYHA class III symptoms, stable.  Weight is up a little.  Difficult exam but appears to remain volume overloaded.   - Continue Coreg as there was still a mid-cavity gradient present on last echo (mild compared to past). - Increase torsemide to 100 mg bid.  Will check BMET/BNP today and BMET in 10 days.  - Continue metolazone 3 times a week.  - Return in 1 month.  2. Pulmonary: Suspect OHS/OSA.   - Intolerant CPAP.    - Continue home  oxygen at all times.   - She has pulmonary followup (Dr. Elsworth Soho). 3. Renal: Check BMET today and again in 10 days with diuretic changes.   Larey Dresser 07/01/2013

## 2013-07-10 ENCOUNTER — Other Ambulatory Visit (HOSPITAL_COMMUNITY): Payer: Self-pay | Admitting: Adult Health

## 2013-07-11 ENCOUNTER — Other Ambulatory Visit: Payer: Medicare Other

## 2013-07-16 ENCOUNTER — Telehealth: Payer: Self-pay | Admitting: Cardiology

## 2013-07-16 ENCOUNTER — Other Ambulatory Visit: Payer: Medicare Other

## 2013-07-16 NOTE — Telephone Encounter (Signed)
Pt given appt to see Dr Aundra Dubin 07/17/13 12:45PM.

## 2013-07-16 NOTE — Telephone Encounter (Signed)
New problem   Pt need to speak to nurse concerning she has gained 13 lbs since 07/01/13. Please call pt.

## 2013-07-16 NOTE — Telephone Encounter (Signed)
Patient states that she has gained 13 lbs since office visit 07/01/13. She has increase in edema and SOB. I confirmed that pt is taking torsemide 100mg  two times a day and metolazone 2.5mg  Tu-Th-Sat(she took it today). Pt declined to go to ED for further evaluation. I will call pt  back to schedule appt for 07/17/13. I will forward to Dr Aundra Dubin for review to see if any recommendations prior to office visit 07/17/13.

## 2013-07-17 ENCOUNTER — Ambulatory Visit (INDEPENDENT_AMBULATORY_CARE_PROVIDER_SITE_OTHER): Payer: Medicare Other | Admitting: Cardiology

## 2013-07-17 ENCOUNTER — Inpatient Hospital Stay (HOSPITAL_COMMUNITY)
Admission: AD | Admit: 2013-07-17 | Discharge: 2013-07-22 | DRG: 292 | Disposition: A | Discharge status: Home / Self Care | Payer: Medicare Other | Source: Ambulatory Visit | Attending: Cardiology | Admitting: Cardiology

## 2013-07-17 ENCOUNTER — Encounter (HOSPITAL_COMMUNITY): Payer: Self-pay | Admitting: General Practice

## 2013-07-17 ENCOUNTER — Encounter: Payer: Self-pay | Admitting: Cardiology

## 2013-07-17 VITALS — BP 158/64 | HR 75 | Wt 290.0 lb

## 2013-07-17 DIAGNOSIS — E119 Type 2 diabetes mellitus without complications: Secondary | ICD-10-CM | POA: Diagnosis not present

## 2013-07-17 DIAGNOSIS — E8779 Other fluid overload: Secondary | ICD-10-CM | POA: Diagnosis not present

## 2013-07-17 DIAGNOSIS — Z6841 Body Mass Index (BMI) 40.0 and over, adult: Secondary | ICD-10-CM | POA: Diagnosis not present

## 2013-07-17 DIAGNOSIS — Z9981 Dependence on supplemental oxygen: Secondary | ICD-10-CM

## 2013-07-17 DIAGNOSIS — E785 Hyperlipidemia, unspecified: Secondary | ICD-10-CM | POA: Diagnosis present

## 2013-07-17 DIAGNOSIS — I509 Heart failure, unspecified: Secondary | ICD-10-CM

## 2013-07-17 DIAGNOSIS — E876 Hypokalemia: Secondary | ICD-10-CM | POA: Diagnosis present

## 2013-07-17 DIAGNOSIS — Z794 Long term (current) use of insulin: Secondary | ICD-10-CM | POA: Diagnosis not present

## 2013-07-17 DIAGNOSIS — Z713 Dietary counseling and surveillance: Secondary | ICD-10-CM

## 2013-07-17 DIAGNOSIS — I10 Essential (primary) hypertension: Secondary | ICD-10-CM | POA: Diagnosis present

## 2013-07-17 DIAGNOSIS — G4733 Obstructive sleep apnea (adult) (pediatric): Secondary | ICD-10-CM | POA: Diagnosis present

## 2013-07-17 DIAGNOSIS — N179 Acute kidney failure, unspecified: Secondary | ICD-10-CM

## 2013-07-17 DIAGNOSIS — R5381 Other malaise: Secondary | ICD-10-CM | POA: Diagnosis present

## 2013-07-17 DIAGNOSIS — I5033 Acute on chronic diastolic (congestive) heart failure: Secondary | ICD-10-CM | POA: Diagnosis present

## 2013-07-17 DIAGNOSIS — Z79899 Other long term (current) drug therapy: Secondary | ICD-10-CM | POA: Diagnosis not present

## 2013-07-17 DIAGNOSIS — I369 Nonrheumatic tricuspid valve disorder, unspecified: Secondary | ICD-10-CM | POA: Diagnosis not present

## 2013-07-17 DIAGNOSIS — J9600 Acute respiratory failure, unspecified whether with hypoxia or hypercapnia: Secondary | ICD-10-CM

## 2013-07-17 DIAGNOSIS — E662 Morbid (severe) obesity with alveolar hypoventilation: Secondary | ICD-10-CM | POA: Diagnosis present

## 2013-07-17 DIAGNOSIS — N259 Disorder resulting from impaired renal tubular function, unspecified: Secondary | ICD-10-CM | POA: Diagnosis not present

## 2013-07-17 DIAGNOSIS — Z7982 Long term (current) use of aspirin: Secondary | ICD-10-CM

## 2013-07-17 LAB — COMPREHENSIVE METABOLIC PANEL
ALBUMIN: 3 g/dL — AB (ref 3.5–5.2)
ALT: 21 U/L (ref 0–35)
AST: 38 U/L — ABNORMAL HIGH (ref 0–37)
Alkaline Phosphatase: 59 U/L (ref 39–117)
BILIRUBIN TOTAL: 0.3 mg/dL (ref 0.3–1.2)
BUN: 37 mg/dL — AB (ref 6–23)
CHLORIDE: 93 meq/L — AB (ref 96–112)
CO2: 32 mEq/L (ref 19–32)
CREATININE: 0.97 mg/dL (ref 0.50–1.10)
Calcium: 9.3 mg/dL (ref 8.4–10.5)
GFR calc Af Amer: 68 mL/min — ABNORMAL LOW (ref >90)
GFR calc non Af Amer: 59 mL/min — ABNORMAL LOW (ref >90)
Glucose, Bld: 138 mg/dL — ABNORMAL HIGH (ref 70–99)
POTASSIUM: 3.9 meq/L (ref 3.7–5.3)
Sodium: 142 mEq/L (ref 137–147)
Total Protein: 7.1 g/dL (ref 6.0–8.3)

## 2013-07-17 LAB — GLUCOSE, CAPILLARY
GLUCOSE-CAPILLARY: 137 mg/dL — AB (ref 70–99)
Glucose-Capillary: 139 mg/dL — ABNORMAL HIGH (ref 70–99)

## 2013-07-17 LAB — TSH: TSH: 2.01 u[IU]/mL (ref 0.350–4.500)

## 2013-07-17 LAB — CBC WITH DIFFERENTIAL/PLATELET
Basophils Absolute: 0 10*3/uL (ref 0.0–0.1)
Basophils Relative: 0 % (ref 0–1)
Eosinophils Absolute: 0.1 10*3/uL (ref 0.0–0.7)
Eosinophils Relative: 1 % (ref 0–5)
HCT: 35.6 % — ABNORMAL LOW (ref 36.0–46.0)
Hemoglobin: 11.6 g/dL — ABNORMAL LOW (ref 12.0–15.0)
LYMPHS ABS: 2 10*3/uL (ref 0.7–4.0)
LYMPHS PCT: 20 % (ref 12–46)
MCH: 26.5 pg (ref 26.0–34.0)
MCHC: 32.6 g/dL (ref 30.0–36.0)
MCV: 81.3 fL (ref 78.0–100.0)
MONOS PCT: 7 % (ref 3–12)
Monocytes Absolute: 0.7 10*3/uL (ref 0.1–1.0)
Neutro Abs: 7.1 10*3/uL (ref 1.7–7.7)
Neutrophils Relative %: 72 % (ref 43–77)
PLATELETS: 306 10*3/uL (ref 150–400)
RBC: 4.38 MIL/uL (ref 3.87–5.11)
RDW: 16.6 % — AB (ref 11.5–15.5)
WBC: 9.9 10*3/uL (ref 4.0–10.5)

## 2013-07-17 LAB — PRO B NATRIURETIC PEPTIDE: PRO B NATRI PEPTIDE: 52.7 pg/mL (ref 0–125)

## 2013-07-17 MED ORDER — BIOTENE DRY MOUTH MT LIQD
15.0000 mL | Freq: Two times a day (BID) | OROMUCOSAL | Status: DC
  Administered 2013-07-18 – 2013-07-22 (×8): 15 mL via OROMUCOSAL

## 2013-07-17 MED ORDER — CARVEDILOL 25 MG PO TABS
37.5000 mg | ORAL_TABLET | Freq: Two times a day (BID) | ORAL | Status: DC
  Administered 2013-07-17 – 2013-07-18 (×2): 37.5 mg via ORAL
  Filled 2013-07-17 (×5): qty 1

## 2013-07-17 MED ORDER — FENOFIBRATE 160 MG PO TABS
160.0000 mg | ORAL_TABLET | Freq: Every morning | ORAL | Status: DC
  Administered 2013-07-18 – 2013-07-22 (×5): 160 mg via ORAL
  Filled 2013-07-17 (×5): qty 1

## 2013-07-17 MED ORDER — ASPIRIN 81 MG PO TABS: 81.0000 mg | ORAL_TABLET | Freq: Every day | ORAL | Status: DC

## 2013-07-17 MED ORDER — INSULIN ASPART 100 UNIT/ML ~~LOC~~ SOLN: 0.0000 [IU] | SUBCUTANEOUS | Status: DC

## 2013-07-17 MED ORDER — INSULIN ASPART 100 UNIT/ML ~~LOC~~ SOLN
60.0000 [IU] | Freq: Three times a day (TID) | SUBCUTANEOUS | Status: DC
  Administered 2013-07-18 – 2013-07-20 (×5): 60 [IU] via SUBCUTANEOUS
  Administered 2013-07-20: 40 [IU] via SUBCUTANEOUS

## 2013-07-17 MED ORDER — LORAZEPAM 0.5 MG PO TABS: 0.5000 mg | ORAL_TABLET | Freq: Every day | ORAL | Status: DC | PRN

## 2013-07-17 MED ORDER — INSULIN GLARGINE 100 UNIT/ML ~~LOC~~ SOLN
60.0000 [IU] | Freq: Every day | SUBCUTANEOUS | Status: DC
  Administered 2013-07-18 – 2013-07-21 (×4): 60 [IU] via SUBCUTANEOUS
  Filled 2013-07-17 (×6): qty 0.6

## 2013-07-17 MED ORDER — FUROSEMIDE 10 MG/ML IJ SOLN
80.0000 mg | Freq: Three times a day (TID) | INTRAMUSCULAR | Status: DC
  Administered 2013-07-17 – 2013-07-21 (×11): 80 mg via INTRAVENOUS
  Filled 2013-07-17 (×18): qty 8

## 2013-07-17 MED ORDER — DULOXETINE HCL 20 MG PO CPEP
20.0000 mg | ORAL_CAPSULE | Freq: Every day | ORAL | Status: DC
  Administered 2013-07-18 – 2013-07-22 (×5): 20 mg via ORAL
  Filled 2013-07-17 (×5): qty 1

## 2013-07-17 MED ORDER — ROPINIROLE HCL 1 MG PO TABS
2.0000 mg | ORAL_TABLET | Freq: Two times a day (BID) | ORAL | Status: DC
  Administered 2013-07-17 – 2013-07-22 (×10): 2 mg via ORAL
  Filled 2013-07-17 (×11): qty 2

## 2013-07-17 MED ORDER — OMEGA-3-ACID ETHYL ESTERS 1 G PO CAPS
1.0000 g | ORAL_CAPSULE | Freq: Two times a day (BID) | ORAL | Status: DC
  Administered 2013-07-17 – 2013-07-22 (×10): 1 g via ORAL
  Filled 2013-07-17 (×11): qty 1

## 2013-07-17 MED ORDER — OXYCODONE-ACETAMINOPHEN 5-325 MG PO TABS
1.0000 | ORAL_TABLET | Freq: Four times a day (QID) | ORAL | Status: DC | PRN
  Filled 2013-07-17: qty 1

## 2013-07-17 MED ORDER — HEPARIN SODIUM (PORCINE) 5000 UNIT/ML IJ SOLN
5000.0000 [IU] | Freq: Three times a day (TID) | INTRAMUSCULAR | Status: DC
  Administered 2013-07-17 – 2013-07-22 (×15): 5000 [IU] via SUBCUTANEOUS
  Filled 2013-07-17 (×18): qty 1

## 2013-07-17 MED ORDER — ROPINIROLE HCL 1 MG PO TABS: 2.0000 mg | ORAL_TABLET | Freq: Two times a day (BID) | ORAL | Status: DC

## 2013-07-17 MED ORDER — PREGABALIN 25 MG PO CAPS
50.0000 mg | ORAL_CAPSULE | Freq: Three times a day (TID) | ORAL | Status: DC
  Administered 2013-07-17 – 2013-07-22 (×11): 50 mg via ORAL
  Filled 2013-07-17 (×12): qty 2

## 2013-07-17 MED ORDER — POTASSIUM CHLORIDE CRYS ER 20 MEQ PO TBCR
20.0000 meq | EXTENDED_RELEASE_TABLET | Freq: Every day | ORAL | Status: DC
  Administered 2013-07-17: 20 meq via ORAL
  Filled 2013-07-17 (×2): qty 1

## 2013-07-17 MED ORDER — HYDRALAZINE HCL 25 MG PO TABS
25.0000 mg | ORAL_TABLET | Freq: Three times a day (TID) | ORAL | Status: DC
  Administered 2013-07-17 – 2013-07-20 (×10): 25 mg via ORAL
  Filled 2013-07-17 (×14): qty 1

## 2013-07-17 MED ORDER — METOLAZONE 2.5 MG PO TABS
2.5000 mg | ORAL_TABLET | Freq: Every day | ORAL | Status: DC
  Administered 2013-07-17 – 2013-07-20 (×4): 2.5 mg via ORAL
  Filled 2013-07-17 (×4): qty 1

## 2013-07-17 MED ORDER — ASPIRIN EC 81 MG PO TBEC
81.0000 mg | DELAYED_RELEASE_TABLET | Freq: Every day | ORAL | Status: DC
  Administered 2013-07-17 – 2013-07-22 (×6): 81 mg via ORAL
  Filled 2013-07-17 (×6): qty 1

## 2013-07-17 NOTE — Progress Notes (Signed)
Patient ID: Melissa Myers, female   DOB: 1945-07-08, 68 y.o.   MRN: [id number] PCP: Dr. Asa Lente  68 yo with history of HTN, DM, hyperlipidemia, OHS/OSA, and chronic dyspnea/diastolic CHF returns for cardiac evaluation. Patient had an echo in 3/11 showing moderate LVH and preserved LV systolic function. However, there was a very large LV mid-cavity gradient with valsalva.  Patient developed quite significant exertional dyspnea to the point where she was short of breath walking around her house. She had a pulmonary evaluation with Dr. Elsworth Soho but no primary lung problems were identified. Patient had an ETT-myoview in 4/11 which was negative for ischemia or infarction. Right heart cath in 4/11 showed mildly elevated right heart filling pressures but normal PA pressure and normal PCWP. I started her on a beta blocker (Coreg) to try to lower her LV mid-cavity gradient (that likely occurs with exertion) and to better control BP.  V/Q scan was negative for PE.  PFTs from 5/13 showed a restrictive defect. Last echo in 4/14 with normal EF, mild mid-cavity LV gradient.     Symptomatically, [name] is a little more short of breath.  Weight is up another 5 lbs despite diuretic adjustment.  She can walk around her house without dypsnea.  She is short of breath walking longer distances such as when she leaves the house.  She uses a walker outside the house.  She is short of breath with stairs.  No orthopnea, PND, or chest pain.  She continues to have painful neuropathic symptoms in her feet.  She is on Lyrica.  She is wearing oxygen at all times and is unable to tolerate CPAP.  She does try to control her sodium intake but drinks a lot of fluid.   Labs (4/11): TGs 539, HDL 58, LDL 128, K 5.2, creatinine 1.3, TSH normal, BNP 14  Labs (5/11): K 3.9, creatinine 1.0  Labs (9/11): creatinine 1.1, BUN 43, LDL 145, HDL 60  Labs (10/11): K 4.3, creatinine 0.81, HCT 34, LDL 115, HDL 75  Labs (11/12): K 4.2, creatinine  1.58 => 3.0, HCT 32.2, BNP 172, LDL 97, TGs 980 Labs (12/12): K 3.4, creatinine 1.2 => 1.7 Labs (1/13): K 3.9, creatinine 1.1, TGs 281, LDL 121 Labs (2/13): K 3.3, creatinine 1.2 Labs (10/13): K 4.1, creatinine 0.85 Labs (4/14): K 3.7, creatinine 1.2, BUN 43, BNP 23 Labs (5/14): K 4.1, creatinine 1.0 Labs (7/14): K 4.6 Creatinine 1.0 BNP 39.0  Labs (8/14): K 3.5, creatinine 0.91, BNP 71 Labs (11/14): K 3.7, creatinine 0.9 Labs (12/14): K 4.1, creatinine 0.8, BNP 52 Labs (3/15): K 4, creatinine 0.9 Labs (4/15): K 3.7, creatinine 0.9 Labs (5/15): K 3.8, creatinine 0.9, BNP 29  ECG: NSR, IVCD  Allergies (verified):  1) ! Sulfa  2) Morphine   Past Medical History:  1. Diabetes mellitus, type II  2. Hyperlipidemia: She has been unable to tolerate statins (has been on Vytorin, lovastatin, and Crestor) due to what sounds like rhabdo: developed muscle weakness and was told she had "muscle damage" with each of these statins. She has been told not to take statins anymore.  3. Hypertension 4. Obesity  5. Restless Leg Syndrome  6. OA  7. peripheral neuropathy  8. OHS/OSA: Uses supplemental oxygen, does not tolerate CPAP.   9. Chronic nausea/diarrhea: ? IBS  10. Diastolic CHF: Echo (1/82) with normal LV size, moderate LVH, EF 65-70%, LV mid-cavity gradient reaching 63 mmHg with valsalva but minimal at rest, grade I diastolic dysfunction, cannot estimate  PA systolic pressure (no TR doppler signal), RV normal. RHC (4/11): Mean RA 11, RV 37/13, PA 33/15, mean PCWP 12, CI 3.3. Echo in 11/12 showed mild LVH, EF 65-70%, no LV mid-cavity gradient was mentioned.  Echo in 4/14 showed EF 65-70%, mild LVH, grade I diastolic dysfunction, PA systolic pressure 35 mmHg, mild LV mid-cavity gradient.  11. ETT-myoview (4/11): 5'30", stopped due to fatigue, normal EF, no evidence for scar or ischemia. 12. V/Q scan 11/12: negative for PE.  Lower extremity venous doppler US (3/14): negative for DVT.  13. PFTs  (5/13): FVC 50%, FEV1 62%, ratio 106%, DLCO 69%, TLC 69%.   Family History:  Family History of Alcoholism/Addiction (parent)  Family History Diabetes 1st degree relative (grandparent)  Family History High cholesterol (parent, grandparent)  Family History Hypertension (parent, grandparent)  Family History Lung cancer (grandparent)  Stomach cancer (grandmother)  Celiac Sprue daughter  Mother with MI at 51, father with MI at 51, uncle with MI at 77, brother with stents in his 33s, multiple aunts with MIs and CVAs   Social History:  Never Smoked  no alcohol  married, lives with spouse and her mother  retired Network engineer - now housewife  Alcohol Use - no  Illicit Drug Use - no  Review of Systems  All systems reviewed and negative except as per HPI.   ROS: All systems reviewed and negative except as per HPI.   Current Outpatient Prescriptions  Medication Sig Dispense Refill  . aspirin 81 MG tablet Take 81 mg by mouth daily.       . carvedilol (COREG) 25 MG tablet TAKE 1 1/2 TABLETS BY MOUTH TWICE DAILY  90 tablet  0  . Coenzyme Q10 (COQ10) 30 MG CAPS Take 1 capsule by mouth daily.       . DULoxetine (CYMBALTA) 20 MG capsule Take 1 capsule (20 mg total) by mouth daily.  30 capsule  3  . fenofibrate 160 MG tablet Take 160 mg by mouth every morning.      . hydrALAZINE (APRESOLINE) 25 MG tablet Take 1 tablet (25 mg total) by mouth 3 (three) times daily.  90 tablet  6  . insulin glulisine (APIDRA) 100 UNIT/ML injection Inject 60 units with breakfast, 60 units with lunch and 70 units with dinner.  6 vial  3  . Insulin Syringe-Needle U-100 (BD INSULIN SYRINGE ULTRAFINE) 31G X 5/16" 1 ML MISC Inject insulin 3 times daily as instructed. Dx code: 250.43  100 each  5  . Insulin Syringe-Needle U-100 (INSULIN SYRINGE 1CC/30GX5/16") 30G X 5/16" 1 ML MISC USE TO INJECT INSULIN 4 TIMES DAILY AS INSTRUCTED  150 each  3  . LANTUS 100 UNIT/ML injection INJECT 60 UNITS EVERY NIGHT AT BEDTIME  30 mL  0  .  LORazepam (ATIVAN) 0.5 MG tablet Take 0.5 mg by mouth daily as needed for anxiety. Not currently taking      . Magnesium 200 MG TABS Take 1 tablet by mouth daily.   60 each    . metolazone (ZAROXOLYN) 2.5 MG tablet 1 tablet on Tues,Thurs, and Sat 30  minutes before your morning torsemide  30 tablet  1  . NON FORMULARY Oxygen at home 2-4 L (Pt unsure of which one)      . omega-3 acid ethyl esters (LOVAZA) 1 G capsule TAKE 2 CAPSULES BY MOUTH TWICE DAILY  180 capsule  0  . oxyCODONE-acetaminophen (PERCOCET/ROXICET) 5-325 MG per tablet Take 1 tablet by mouth every 6 (six) hours as needed.  30 tablet  0  . potassium chloride (K-DUR) 10 MEQ tablet TAKE 4 TABLETS BY MOUTH THREE TIMES DAILY  360 tablet  0  . pregabalin (LYRICA) 50 MG capsule Take 1 capsule (50 mg total) by mouth 3 (three) times daily.  90 capsule  5  . rOPINIRole (REQUIP) 2 MG tablet Take 2 mg by mouth 2 (two) times daily.      [name] torsemide (DEMADEX) 20 MG tablet Take 5 tablets (100 mg total) by mouth 2 (two) times daily.      [name] torsemide (DEMADEX) 20 MG tablet TAKE 4 TABLETS BY MOUTH TWICE DAILY  240 tablet  0   No current facility-administered medications for this visit.    BP 158/64  Pulse 75  Wt 290 lb (131.543 kg) General: NAD, obese  Neck: Thick, JVP 10-11, no thyromegaly or thyroid nodule.  Lungs: Clear to auscultation bilaterally with normal respiratory effort. CV: Nondisplaced PMI.  Heart regular S1/S2, no S3/S4, 2/6 SEM RUSB.  2+ edema to knees with venous stasis changes in the lower legs.  No carotid bruit.  Abdomen: Soft, nontender, no hepatosplenomegaly, no distention.  Neurologic: Alert and oriented x 3.  Psych: Normal affect. Extremities: No clubbing/cyanosis    Assessment/Plan: 1. Acute on chronic diastolic CHF:  NYHA class III symptoms, gradually worsening.  Weight continues to rise despite aggressive diuretic regimen.  She is volume overloaded on exam.   - Continue Coreg as there was still a mid-cavity gradient  present on last echo (mild compared to past).  - We are not having much luck with outpatient diuretics and she continues to gain fluid.  She is quite volume overloaded at this point.  I am going to admit her today for IV diuresis.  I will start her on Lasix 80 mg IV every 8 hours with metolazone 2.5 mg 30 minutes prior to am Lasix once daily.    - I will repeat echocardiogram.  2. Pulmonary: Suspect OHS/OSA.   - Intolerant CPAP.    - Continue home oxygen at all times.   - She has pulmonary followup (Dr. Elsworth Soho). 3. Renal: Follow BMET closely with diuresis.   Larey Dresser 07/17/2013

## 2013-07-17 NOTE — Progress Notes (Signed)
1600 Came in from MD's office via wheelchair with 02 on. Kept comfortable in bed . Orientation to unit done

## 2013-07-17 NOTE — Care Management Note (Addendum)
    Page 1 of 1   07/22/2013     10:52:42 AM CARE MANAGEMENT NOTE 07/22/2013  Patient:  Melissa Myers, Melissa Myers   Account Number:  [id number]  Date Initiated:  07/17/2013  Documentation initiated by:  HUTCHINSON,CRYSTAL  Subjective/Objective Assessment:   Admitted with CHF     Action/Plan:   CM to follow for disposition needs   Anticipated DC Date:  07/20/2013   Anticipated DC Plan:  HOME/SELF CARE         Choice offered to / List presented to:          [facility] arranged  HH-1 RN  [place].   Status of service:  In process, will continue to follow Medicare Important Message given?  YES (If response is "NO", the following Medicare IM given date fields will be blank) Date Medicare IM given:  07/17/2013 Date Additional Medicare IM given:  07/22/2013  Discharge Disposition:  [place]  Per UR Regulation:  Reviewed for med. necessity/level of care/duration of stay  If discussed at [employer] of Stay Meetings, dates discussed:   07/23/2013    Comments:  07/22/13 1005 Chrystel Barefield J. Clydene Laming, RN, BSN, [employer] [phone] CM spoke with patient concerning discharge planning. Pt offered choice for [facility] for [facility] services upon discharge. Per pt choice [facility] to provide [facility] services.  [place] rep, Hansel Feinstein, RN contacted concerning new referral. Pt to discharge home alone. Pt request HHRN for disease management upon discharge. No DME needs stated.  Crystal Hutchinson RN, BSN, MSHL, CCM  Nurse - Case Manager, (Unit [phone]  07/17/2013 IV Lasix q 8 hours. Disposition Plan:  pending.

## 2013-07-17 NOTE — Progress Notes (Signed)
With restless discomfort . Repositioned . Med unavailable when due > Pt aware

## 2013-07-18 DIAGNOSIS — I369 Nonrheumatic tricuspid valve disorder, unspecified: Secondary | ICD-10-CM

## 2013-07-18 DIAGNOSIS — I5033 Acute on chronic diastolic (congestive) heart failure: Principal | ICD-10-CM

## 2013-07-18 DIAGNOSIS — I509 Heart failure, unspecified: Secondary | ICD-10-CM

## 2013-07-18 LAB — GLUCOSE, CAPILLARY
GLUCOSE-CAPILLARY: 73 mg/dL (ref 70–99)
GLUCOSE-CAPILLARY: 74 mg/dL (ref 70–99)
Glucose-Capillary: 190 mg/dL — ABNORMAL HIGH (ref 70–99)
Glucose-Capillary: 103 mg/dL — ABNORMAL HIGH (ref 70–99)
Glucose-Capillary: 159 mg/dL — ABNORMAL HIGH (ref 70–99)

## 2013-07-18 LAB — BASIC METABOLIC PANEL
BUN: 38 mg/dL — ABNORMAL HIGH (ref 6–23)
BUN: 37 mg/dL — AB (ref 6–23)
CALCIUM: 9.2 mg/dL (ref 8.4–10.5)
CALCIUM: 9.6 mg/dL (ref 8.4–10.5)
CO2: 38 mEq/L — ABNORMAL HIGH (ref 19–32)
CO2: 37 mEq/L — ABNORMAL HIGH (ref 19–32)
CREATININE: 1.01 mg/dL (ref 0.50–1.10)
Chloride: 93 mEq/L — ABNORMAL LOW (ref 96–112)
Chloride: 94 mEq/L — ABNORMAL LOW (ref 96–112)
Creatinine, Ser: 0.89 mg/dL (ref 0.50–1.10)
GFR, EST AFRICAN AMERICAN: 75 mL/min — AB (ref >90)
GFR, EST AFRICAN AMERICAN: 65 mL/min — AB (ref >90)
GFR, EST NON AFRICAN AMERICAN: 65 mL/min — AB (ref >90)
GFR, EST NON AFRICAN AMERICAN: 56 mL/min — AB (ref >90)
GLUCOSE: 71 mg/dL (ref 70–99)
Glucose, Bld: 163 mg/dL — ABNORMAL HIGH (ref 70–99)
POTASSIUM: 3.2 meq/L — AB (ref 3.7–5.3)
Potassium: 3.4 mEq/L — ABNORMAL LOW (ref 3.7–5.3)
SODIUM: 143 meq/L (ref 137–147)
Sodium: 143 mEq/L (ref 137–147)

## 2013-07-18 MED ORDER — CARVEDILOL 25 MG PO TABS
37.5000 mg | ORAL_TABLET | Freq: Two times a day (BID) | ORAL | Status: DC
  Administered 2013-07-18 – 2013-07-21 (×6): 37.5 mg via ORAL
  Filled 2013-07-18 (×8): qty 1

## 2013-07-18 MED ORDER — CARVEDILOL 25 MG PO TABS: 37.5000 mg | ORAL_TABLET | Freq: Two times a day (BID) | ORAL | Status: DC

## 2013-07-18 MED ORDER — POTASSIUM CHLORIDE CRYS ER 20 MEQ PO TBCR
40.0000 meq | EXTENDED_RELEASE_TABLET | Freq: Two times a day (BID) | ORAL | Status: DC
  Administered 2013-07-18 – 2013-07-20 (×6): 40 meq via ORAL
  Filled 2013-07-18 (×10): qty 2

## 2013-07-18 MED ORDER — POTASSIUM CHLORIDE CRYS ER 20 MEQ PO TBCR
40.0000 meq | EXTENDED_RELEASE_TABLET | Freq: Once | ORAL | Status: AC
  Administered 2013-07-18: 40 meq via ORAL
  Filled 2013-07-18: qty 2

## 2013-07-18 NOTE — Progress Notes (Signed)
Report given to receiving RN. Patient in bed sleeping. No signs or symptoms of distress or discomfort.

## 2013-07-18 NOTE — Progress Notes (Signed)
Patient ID: Melissa Myers, female   DOB: 08/08/1945, 68 y.o.   MRN: [id number]   SUBJECTIVE: Patient diuresed very vigorously overnight.   Renal function stable.  Weight down 9 lbs.   Scheduled Meds: . antiseptic oral rinse  15 mL Mouth Rinse BID  . aspirin EC  81 mg Oral Daily  . carvedilol  37.5 mg Oral BID WC  . DULoxetine  20 mg Oral Daily  . fenofibrate  160 mg Oral q morning - 10a  . furosemide  80 mg Intravenous Q8H  . heparin subcutaneous  5,000 Units Subcutaneous 3 times per day  . hydrALAZINE  25 mg Oral TID  . insulin aspart  60 Units Subcutaneous TID WC  . insulin glargine  60 Units Subcutaneous QHS  . metolazone  2.5 mg Oral Daily  . omega-3 acid ethyl esters  1 g Oral BID  . potassium chloride  40 mEq Oral Once  . potassium chloride  40 mEq Oral BID  . pregabalin  50 mg Oral TID  . rOPINIRole  2 mg Oral BID   Continuous Infusions:  PRN Meds:.LORazepam, oxyCODONE-acetaminophen    Filed Vitals:   07/17/13 2344 07/18/13 0300 07/18/13 0439 07/18/13 0726  BP: 108/40  107/36 140/44  Pulse: 70  72 68  Temp: 97.9 F (36.6 C)  98 F (36.7 C)   TempSrc: Oral Oral Oral   Resp: 18  18   Height:      Weight:   280 lb 9.6 oz (127.279 kg)   SpO2: 98%  98%     Intake/Output Summary (Last 24 hours) at 07/18/13 0733 Last data filed at 07/18/13 0400  Gross per 24 hour  Intake    460 ml  Output   5500 ml  Net  -5040 ml    LABS: Basic Metabolic Panel:  Recent Labs  07/17/13 1645 07/18/13 0450  NA 142 143  K 3.9 3.2*  CL 93* 93*  CO2 32 38*  GLUCOSE 138* 163*  BUN 37* 38*  CREATININE 0.97 0.89  CALCIUM 9.3 9.2   Liver Function Tests:  Recent Labs  07/17/13 1645  AST 38*  ALT 21  ALKPHOS 59  BILITOT 0.3  PROT 7.1  ALBUMIN 3.0*   No results found for this basename: LIPASE, AMYLASE,  in the last 72 hours CBC:  Recent Labs  07/17/13 1645  WBC 9.9  NEUTROABS 7.1  HGB 11.6*  HCT 35.6*  MCV 81.3  PLT 306   Cardiac Enzymes: No results  found for this basename: CKTOTAL, CKMB, CKMBINDEX, TROPONINI,  in the last 72 hours BNP: No components found with this basename: POCBNP,  D-Dimer: No results found for this basename: DDIMER,  in the last 72 hours Hemoglobin A1C: No results found for this basename: HGBA1C,  in the last 72 hours Fasting Lipid Panel: No results found for this basename: CHOL, HDL, LDLCALC, TRIG, CHOLHDL, LDLDIRECT,  in the last 72 hours Thyroid Function Tests:  Recent Labs  07/17/13 1645  TSH 2.010   Anemia Panel: No results found for this basename: VITAMINB12, FOLATE, FERRITIN, TIBC, IRON, RETICCTPCT,  in the last 72 hours  RADIOLOGY: No results found.  PHYSICAL EXAM General: NAD Neck: JVP 10-12 cm, no thyromegaly or thyroid nodule.  Lungs: Clear to auscultation bilaterally with normal respiratory effort. CV: Nondisplaced PMI.  Heart regular S1/S2, no S3/S4, 2/6 early SEM.  2+ edema to knees bilaterally.  Abdomen: Soft, nontender, no hepatosplenomegaly, no distention.  Neurologic: Alert and oriented x 3.  Psych: Normal affect. Extremities: No clubbing or cyanosis.   TELEMETRY: Reviewed telemetry pt in NSR  ASSESSMENT AND PLAN: 1. Acute on chronic diastolic CHF: NYHA class IIIb symptoms, gradually worsening. Weight continues to rise despite aggressive outpatient diuretic regimen. She was admitted for IV diuresis.  She is doing well so far with good UOP and weight loss.  - Continue Coreg as there was still a mid-cavity gradient present on last echo (mild compared to past).  Will repeat echo this admission.   - Continue current IV Lasix with metolazone for now.  - Replace K, repeat labs in pm. 2. Pulmonary: Suspect OHS/OSA.  - Intolerant CPAP.  - Continue home oxygen at all times.  - She has pulmonary followup (Dr. Elsworth Soho).  3. Renal: Follow BMET closely with diuresis.  Larey Dresser 07/18/2013 7:36 AM

## 2013-07-18 NOTE — Progress Notes (Signed)
UR completed Kristiana Jacko K. Megumi Treaster, RN, BSN, MSHL, CCM  07/18/2013 4:31 PM

## 2013-07-18 NOTE — Progress Notes (Signed)
  Echocardiogram 2D Echocardiogram has been performed.  Carney Corners 07/18/2013, 10:11 AM

## 2013-07-18 NOTE — Progress Notes (Signed)
Ok to hold scheduled novolog. PA aware. Will continue to monitor patient for further changes in condition.

## 2013-07-19 DIAGNOSIS — E119 Type 2 diabetes mellitus without complications: Secondary | ICD-10-CM

## 2013-07-19 DIAGNOSIS — E662 Morbid (severe) obesity with alveolar hypoventilation: Secondary | ICD-10-CM

## 2013-07-19 LAB — GLUCOSE, CAPILLARY
GLUCOSE-CAPILLARY: 168 mg/dL — AB (ref 70–99)
GLUCOSE-CAPILLARY: 138 mg/dL — AB (ref 70–99)
GLUCOSE-CAPILLARY: 200 mg/dL — AB (ref 70–99)
GLUCOSE-CAPILLARY: 68 mg/dL — AB (ref 70–99)
Glucose-Capillary: 49 mg/dL — ABNORMAL LOW (ref 70–99)
Glucose-Capillary: 85 mg/dL (ref 70–99)
Glucose-Capillary: 263 mg/dL — ABNORMAL HIGH (ref 70–99)

## 2013-07-19 LAB — BASIC METABOLIC PANEL
BUN: 35 mg/dL — AB (ref 6–23)
CALCIUM: 9.6 mg/dL (ref 8.4–10.5)
CO2: 40 mEq/L (ref 19–32)
Chloride: 94 mEq/L — ABNORMAL LOW (ref 96–112)
Creatinine, Ser: 0.85 mg/dL (ref 0.50–1.10)
GFR calc Af Amer: 80 mL/min — ABNORMAL LOW (ref >90)
GFR, EST NON AFRICAN AMERICAN: 69 mL/min — AB (ref >90)
GLUCOSE: 151 mg/dL — AB (ref 70–99)
Potassium: 3.2 mEq/L — ABNORMAL LOW (ref 3.7–5.3)
Sodium: 145 mEq/L (ref 137–147)

## 2013-07-19 MED ORDER — ACETAZOLAMIDE ER 500 MG PO CP12
500.0000 mg | ORAL_CAPSULE | Freq: Every day | ORAL | Status: DC
  Administered 2013-07-19 – 2013-07-21 (×3): 500 mg via ORAL
  Filled 2013-07-19 (×4): qty 1

## 2013-07-19 MED ORDER — POTASSIUM CHLORIDE CRYS ER 20 MEQ PO TBCR
40.0000 meq | EXTENDED_RELEASE_TABLET | Freq: Once | ORAL | Status: AC
  Administered 2013-07-19: 40 meq via ORAL

## 2013-07-19 NOTE — Progress Notes (Signed)
CBG re-check is 85. OK to hold scheduled dose insulin. Will continue to monitor patient for further changes in condition.

## 2013-07-19 NOTE — Progress Notes (Signed)
Inpatient Diabetes Program Recommendations  AACE/ADA: New Consensus Statement on Inpatient Glycemic Control (2013)  Target Ranges:  Prepandial:   less than 140 mg/dL      Peak postprandial:   less than 180 mg/dL (1-2 hours)      Critically ill patients:  140 - 180 mg/dL   Reason for Visit: Diabetes Consult  Diabetes history: DM2 Outpatient Diabetes medications: Lantus 60 units QHS, Apidra 50 units at Breakfast, 60 units at Lunch and 70 units at [place] Current orders for Inpatient glycemic control: Lantus 60 units QHS and Novolog 60 units tidwc  Results for Melissa Myers, Melissa Myers (MRN [id number]) as of 07/19/2013 12:18  Ref. Range 07/18/2013 06:12 07/18/2013 10:56 07/18/2013 16:15 07/18/2013 16:16 07/18/2013 21:28 07/19/2013 02:13 07/19/2013 06:44 07/19/2013 11:18  Glucose-Capillary Latest Range: 70-99 mg/dL 190 (H) 103 (H) 74 73 159 (H) 168 (H) 138 (H) 200 (H)   Needs correction insulin. Would decrease meal coverage since pt is consuming less food here in hospital.   Inpatient Diabetes Program Recommendations Insulin - Basal: Continue with Lantus 60 units QHS Correction (SSI): Add Novolog resistant tidwc and hs Insulin - Meal Coverage: Decrease Novolog to 40 units tidwc HgbA1C: Need updated HgbA1C Diet: Add CHO mod med to heart healthy diet  Note: Pt states "I want a different endocrinologist. Can you recommend anybody." Writer told pt to f/u with PCP and ask her for a referral. Stressed importance of checking blood sugars at home 3-4 times/day and taking logbook to MD for insulin adjustments. Pt states she has occasional hypoglycemia at home. Answered questions regarding diet - CHO intake, and portion sizes. Will order OP Diabetes Education consult for other diabetes concerns.  Will continue to follow while inpatient. Thank you. Lorenda Peck, RD, LDN, CDE Inpatient Diabetes Coordinator [phone]

## 2013-07-19 NOTE — Progress Notes (Signed)
The patient did not have any complaints of pain or receive any PRN medications overnight.  Her VS remained stable.

## 2013-07-19 NOTE — Progress Notes (Addendum)
CRITICAL VALUE ALERT  Critical value received:  CBG 49  Date of notification:  07-19-13  Time of notification:  1612  Critical value read back: yes  Nurse who received alert:  Lonzo Cloud RN  MD notified (1st page):  PA  Junie Bame  Time of first page:  1612  MD notified (2nd page):  Time of second page:  Responding MD:  PA Junie Bame  Time MD responded:  [phone]

## 2013-07-19 NOTE — Progress Notes (Signed)
Patient ID: Melissa Myers, female   DOB: 11-02-1945, 68 y.o.   MRN: [id number]   SUBJECTIVE: Patient diuresed very vigorously again overnight.   Renal function stable.  Weight down 7 lbs. CO2 rising.  I am concerned that her home insulin regimen may be too aggressive.   Scheduled Meds: . acetaZOLAMIDE  500 mg Oral Daily  . antiseptic oral rinse  15 mL Mouth Rinse BID  . aspirin EC  81 mg Oral Daily  . carvedilol  37.5 mg Oral BID WC  . DULoxetine  20 mg Oral Daily  . fenofibrate  160 mg Oral q morning - 10a  . furosemide  80 mg Intravenous Q8H  . heparin subcutaneous  5,000 Units Subcutaneous 3 times per day  . hydrALAZINE  25 mg Oral TID  . insulin aspart  60 Units Subcutaneous TID WC  . insulin glargine  60 Units Subcutaneous QHS  . metolazone  2.5 mg Oral Daily  . omega-3 acid ethyl esters  1 g Oral BID  . potassium chloride  40 mEq Oral BID  . potassium chloride  40 mEq Oral Once  . pregabalin  50 mg Oral TID  . rOPINIRole  2 mg Oral BID   Continuous Infusions:  PRN Meds:.LORazepam, oxyCODONE-acetaminophen    Filed Vitals:   07/18/13 1830 07/18/13 2217 07/19/13 0219 07/19/13 0619  BP: 141/58 122/49 123/41 131/41  Pulse: 64 64 65 65  Temp:  98 F (36.7 C) 98.1 F (36.7 C) 97.8 F (36.6 C)  TempSrc:  Oral Oral Oral  Resp:      Height:      Weight:    273 lb 13 oz (124.2 kg)  SpO2:  92% 98% 97%    Intake/Output Summary (Last 24 hours) at 07/19/13 0756 Last data filed at 07/19/13 5400  Gross per 24 hour  Intake    540 ml  Output   5951 ml  Net  -5411 ml    LABS: Basic Metabolic Panel:  Recent Labs  07/18/13 1650 07/19/13 0620  NA 143 145  K 3.4* 3.2*  CL 94* 94*  CO2 37* 40*  GLUCOSE 71 151*  BUN 37* 35*  CREATININE 1.01 0.85  CALCIUM 9.6 9.6   Liver Function Tests:  Recent Labs  07/17/13 1645  AST 38*  ALT 21  ALKPHOS 59  BILITOT 0.3  PROT 7.1  ALBUMIN 3.0*   No results found for this basename: LIPASE, AMYLASE,  in the last 72  hours CBC:  Recent Labs  07/17/13 1645  WBC 9.9  NEUTROABS 7.1  HGB 11.6*  HCT 35.6*  MCV 81.3  PLT 306   Cardiac Enzymes: No results found for this basename: CKTOTAL, CKMB, CKMBINDEX, TROPONINI,  in the last 72 hours BNP: No components found with this basename: POCBNP,  D-Dimer: No results found for this basename: DDIMER,  in the last 72 hours Hemoglobin A1C: No results found for this basename: HGBA1C,  in the last 72 hours Fasting Lipid Panel: No results found for this basename: CHOL, HDL, LDLCALC, TRIG, CHOLHDL, LDLDIRECT,  in the last 72 hours Thyroid Function Tests:  Recent Labs  07/17/13 1645  TSH 2.010   Anemia Panel: No results found for this basename: VITAMINB12, FOLATE, FERRITIN, TIBC, IRON, RETICCTPCT,  in the last 72 hours  RADIOLOGY: No results found.  PHYSICAL EXAM General: NAD Neck: JVP 10-12 cm, no thyromegaly or thyroid nodule.  Lungs: Clear to auscultation bilaterally with normal respiratory effort. CV: Nondisplaced PMI.  Heart regular  S1/S2, no S3/S4, 2/6 early SEM.  1+ edema to knees bilaterally (improving).  Abdomen: Soft, nontender, no hepatosplenomegaly, no distention.  Neurologic: Alert and oriented x 3.  Psych: Normal affect. Extremities: No clubbing or cyanosis.   TELEMETRY: Reviewed telemetry pt in NSR  ASSESSMENT AND PLAN: 1. Acute on chronic diastolic CHF: NYHA class IIIb symptoms, gradually worsening. Weight continued to rise despite aggressive outpatient diuretic regimen. She was admitted for IV diuresis.  She is doing well so far with good UOP and weight loss. She is still volume overloaded.  Given very vigorous response to IV diuretics and apparent minimal response to metolazone and torsemide at home, I am concerned that she may not be taking her diuretics as ordered at home (though she insists she is).  Given OSA, I wonder if she falls asleep and forgets her pm meds.  - Continue Coreg as there was still a mid-cavity gradient present  on last echo (mild compared to past).  Will repeat echo this admission.   - Continue current IV Lasix with metolazone for now.  - Replace K, follow BMET in am. - Needs home health to help with meds.  2. Pulmonary: Suspect OHS/OSA. CO2 elevated on am BMET.   - Intolerant CPAP.  - Continue home oxygen at all times.  - Add Diamox today.  - She has pulmonary followup (Dr. Elsworth Soho).  3. Renal: Follow BMET closely with diuresis. 4. Type II diabetes: I am concerned home insulin regimen may be too aggressive, will involve diabetes coordinator today.  5. Needs PT.  6. Would aim for home Sunday if she continues to diurese well.  Would probably put her back on her prior outpatient regimen but with home health to make sure she is taking her meds correctly.   Larey Dresser 07/19/2013 7:56 AM

## 2013-07-19 NOTE — Progress Notes (Signed)
Report given to receiving RN. Patient in sleeping. No signs or symptoms of distress or discomfort.

## 2013-07-19 NOTE — Evaluation (Signed)
Physical Therapy Evaluation Patient Details Name: Melissa Myers MRN: [id number] DOB: March 07, 1945 Today's Date: 07/19/2013   History of Present Illness  Patient is a 68 y/o female with h/o HTN, DM, hyperlipidemia, OHS/OSA, and chronic dyspnea/diastolic CHF.  She was admitted due to acute on chronic CHF with volume overload.     Clinical Impression  Patient presents with decreased independence with mobility due to deficits listed in PT problem list.  She will benefit from skilled PT in the acute setting to allow return home with family assist.  Feels close to baseline so no HHPT at this time.    Follow Up Recommendations No PT follow up    Equipment Recommendations  None recommended by PT    Recommendations for Other Services       Precautions / Restrictions Precautions Precautions: Fall Precaution Comments: uses walker at home full time      Mobility  Bed Mobility Overal bed mobility: Needs Assistance Bed Mobility: Supine to Sit;Sit to Supine     Supine to sit: HOB elevated;Supervision (with rails) Sit to supine: Min assist   General bed mobility comments: assist for guiding legs into bed; scooting up inbed with bed in trendelenberg  Transfers Overall transfer level: Needs assistance Equipment used: Rolling walker (2 wheeled) Transfers: Sit to/from Stand Sit to Stand: Min guard         General transfer comment: needed more than one attempt and momentum to stand  Ambulation/Gait Ambulation/Gait assistance: Min guard Ambulation Distance (Feet): 40 Feet Assistive device: Rolling walker (2 wheeled) Gait Pattern/deviations: Step-through pattern;Decreased stride length;Shuffle;Antalgic   Gait velocity interpretation: <1.8 ft/sec, indicative of risk for recurrent falls General Gait Details: slow and antalgic on right  Stairs            Wheelchair Mobility    Modified Rankin (Stroke Patients Only)       Balance Overall balance assessment: Needs  assistance         Standing balance support: Bilateral upper extremity supported Standing balance-Leahy Scale: Fair Standing balance comment: UE assist for ambulation                             Pertinent Vitals/Pain Min c/o right knee pain with mobility    Home Living Family/patient expects to be discharged to:: Private residence Living Arrangements: Spouse/significant other Available Help at Discharge: Family Type of Home: House Home Access: Stairs to enter Entrance Stairs-Rails: None Entrance Stairs-Number of Steps: 1 Home Layout: One level Home Equipment: Environmental consultant - 2 wheels;Bedside commode;Shower seat - built in      Prior Function Level of Independence: Independent with assistive device(s)               Hand Dominance   Dominant Hand: Right    Extremity/Trunk Assessment   Upper Extremity Assessment: Generalized weakness           Lower Extremity Assessment: Generalized weakness      Cervical / Trunk Assessment: Kyphotic  Communication   Communication: No difficulties  Cognition Arousal/Alertness: Awake/alert Behavior During Therapy: WFL for tasks assessed/performed Overall Cognitive Status: Within Functional Limits for tasks assessed                      General Comments      Exercises        Assessment/Plan    PT Assessment Patient needs continued PT services  PT Diagnosis Generalized weakness;Difficulty walking   PT  Problem List Decreased mobility;Decreased activity tolerance  PT Treatment Interventions Gait training;Therapeutic exercise;Functional mobility training;Therapeutic activities;Patient/family education   PT Goals (Current goals can be found in the Care Plan section) Acute Rehab PT Goals Patient Stated Goal: to go home PT Goal Formulation: With patient/family Time For Goal Achievement: 07/26/13 Potential to Achieve Goals: Good    Frequency Min 3X/week   Barriers to discharge        Co-evaluation                End of Session Equipment Utilized During Treatment: Gait belt;Oxygen Activity Tolerance: Patient limited by fatigue Patient left: in bed;with call bell/phone within reach;with family/visitor present           Time: 5956-3875 PT Time Calculation (min): 27 min   Charges:   PT Evaluation $Initial PT Evaluation Tier I: 1 Procedure PT Treatments $Gait Training: 8-22 mins   PT G Codes:          Max Sane 07/19/2013, 2:28 PM Magda Kiel, [place] 07/19/2013

## 2013-07-20 DIAGNOSIS — J9600 Acute respiratory failure, unspecified whether with hypoxia or hypercapnia: Secondary | ICD-10-CM

## 2013-07-20 LAB — BASIC METABOLIC PANEL
BUN: 37 mg/dL — AB (ref 6–23)
CHLORIDE: 93 meq/L — AB (ref 96–112)
CO2: 38 meq/L — AB (ref 19–32)
Calcium: 10.1 mg/dL (ref 8.4–10.5)
Creatinine, Ser: 0.93 mg/dL (ref 0.50–1.10)
GFR calc Af Amer: 72 mL/min — ABNORMAL LOW (ref >90)
GFR, EST NON AFRICAN AMERICAN: 62 mL/min — AB (ref >90)
Glucose, Bld: 154 mg/dL — ABNORMAL HIGH (ref 70–99)
POTASSIUM: 3.3 meq/L — AB (ref 3.7–5.3)
SODIUM: 144 meq/L (ref 137–147)

## 2013-07-20 LAB — GLUCOSE, CAPILLARY
GLUCOSE-CAPILLARY: 129 mg/dL — AB (ref 70–99)
Glucose-Capillary: 186 mg/dL — ABNORMAL HIGH (ref 70–99)
Glucose-Capillary: 149 mg/dL — ABNORMAL HIGH (ref 70–99)
Glucose-Capillary: 139 mg/dL — ABNORMAL HIGH (ref 70–99)
Glucose-Capillary: 113 mg/dL — ABNORMAL HIGH (ref 70–99)

## 2013-07-20 MED ORDER — POTASSIUM CHLORIDE CRYS ER 20 MEQ PO TBCR
40.0000 meq | EXTENDED_RELEASE_TABLET | Freq: Once | ORAL | Status: AC
  Administered 2013-07-20: 40 meq via ORAL

## 2013-07-20 MED ORDER — INSULIN ASPART 100 UNIT/ML ~~LOC~~ SOLN
40.0000 [IU] | Freq: Three times a day (TID) | SUBCUTANEOUS | Status: DC
  Administered 2013-07-20 – 2013-07-22 (×6): 40 [IU] via SUBCUTANEOUS

## 2013-07-20 NOTE — Plan of Care (Signed)
Problem: Phase I Progression Outcomes Goal: Dyspnea controlled at rest (HF) Outcome: Completed/Met Date Met:  07/20/13 Continues to diurese with IV Lasix TID.  Breathing is improved at rest.  Continues to require O2 for recovery after activity.  No acute events overnight.  Resting quietly at present.  Will continue to monitor.

## 2013-07-20 NOTE — Progress Notes (Signed)
Patient ID: Melissa Myers, female   DOB: 06-02-1945, 68 y.o.   MRN: [id number]   SUBJECTIVE: Patient diuresed very vigorously again overnight.   Renal function stable.  Weight down 7 lbs. CO2 better on acetazolamide. Breathing improved overall.   Scheduled Meds: . acetaZOLAMIDE  500 mg Oral Daily  . antiseptic oral rinse  15 mL Mouth Rinse BID  . aspirin EC  81 mg Oral Daily  . carvedilol  37.5 mg Oral BID WC  . DULoxetine  20 mg Oral Daily  . fenofibrate  160 mg Oral q morning - 10a  . furosemide  80 mg Intravenous Q8H  . heparin subcutaneous  5,000 Units Subcutaneous 3 times per day  . hydrALAZINE  25 mg Oral TID  . insulin aspart  40 Units Subcutaneous TID WC  . insulin glargine  60 Units Subcutaneous QHS  . omega-3 acid ethyl esters  1 g Oral BID  . potassium chloride  40 mEq Oral BID  . potassium chloride  40 mEq Oral Once  . pregabalin  50 mg Oral TID  . rOPINIRole  2 mg Oral BID   Continuous Infusions:  PRN Meds:.LORazepam, oxyCODONE-acetaminophen    Filed Vitals:   07/19/13 2108 07/20/13 0412 07/20/13 0857 07/20/13 1128  BP: 123/59 124/53 120/50 130/64  Pulse: 69 65 70   Temp: 98.1 F (36.7 C) 98 F (36.7 C)    TempSrc: Oral Oral    Resp: 20 20    Height:      Weight:  120.8 kg (266 lb 5.1 oz)    SpO2: 97% 95%      Intake/Output Summary (Last 24 hours) at 07/20/13 1336 Last data filed at 07/20/13 1212  Gross per 24 hour  Intake   1760 ml  Output   5400 ml  Net  -3640 ml    LABS: Basic Metabolic Panel:  Recent Labs  07/19/13 0620 07/20/13 0345  NA 145 144  K 3.2* 3.3*  CL 94* 93*  CO2 40* 38*  GLUCOSE 151* 154*  BUN 35* 37*  CREATININE 0.85 0.93  CALCIUM 9.6 10.1   Liver Function Tests:  Recent Labs  07/17/13 1645  AST 38*  ALT 21  ALKPHOS 59  BILITOT 0.3  PROT 7.1  ALBUMIN 3.0*   No results found for this basename: LIPASE, AMYLASE,  in the last 72 hours CBC:  Recent Labs  07/17/13 1645  WBC 9.9  NEUTROABS 7.1  HGB 11.6*   HCT 35.6*  MCV 81.3  PLT 306   Cardiac Enzymes: No results found for this basename: CKTOTAL, CKMB, CKMBINDEX, TROPONINI,  in the last 72 hours BNP: No components found with this basename: POCBNP,  D-Dimer: No results found for this basename: DDIMER,  in the last 72 hours Hemoglobin A1C: No results found for this basename: HGBA1C,  in the last 72 hours Fasting Lipid Panel: No results found for this basename: CHOL, HDL, LDLCALC, TRIG, CHOLHDL, LDLDIRECT,  in the last 72 hours Thyroid Function Tests:  Recent Labs  07/17/13 1645  TSH 2.010   Anemia Panel: No results found for this basename: VITAMINB12, FOLATE, FERRITIN, TIBC, IRON, RETICCTPCT,  in the last 72 hours  RADIOLOGY: No results found.  PHYSICAL EXAM General: NAD Neck: JVP 10 cm, no thyromegaly or thyroid nodule.  Lungs: Clear to auscultation bilaterally with normal respiratory effort. CV: Nondisplaced PMI.  Heart regular S1/S2, no S3/S4, 2/6 early SEM.  1+ edema 1/2 to knees bilaterally (improving).  Abdomen: Soft, nontender, no hepatosplenomegaly,  no distention.  Neurologic: Alert and oriented x 3.  Psych: Normal affect. Extremities: No clubbing or cyanosis.   TELEMETRY: Reviewed telemetry pt in NSR  ASSESSMENT AND PLAN: 1. Acute on chronic diastolic CHF: NYHA class IIIb symptoms, gradually worsening. Weight continued to rise despite aggressive outpatient diuretic regimen. She was admitted for IV diuresis.  She is doing well so far with good UOP and weight loss. She still has some volume overload.  Given very vigorous response to IV diuretics and apparent minimal response to metolazone and torsemide at home, I am concerned that she may not be taking her diuretics as ordered at home (though she insists she is).  Given OSA, I wonder if she falls asleep and forgets her pm meds.  - Continue Coreg as there was still a mid-cavity gradient present on last echo (mild compared to past).  Will repeat echo this admission.   -  Continue current IV Lasix with metolazone for one more day, probably to po meds tomorrow.  - Replace K, follow BMET in am. - Needs home health to help with meds.  2. Pulmonary: Suspect OHS/OSA. CO2 elevated on am BMET.   - Intolerant CPAP.  - Continue home oxygen at all times.  - Continue Diamox today.  - She has pulmonary followup (Dr. Elsworth Soho).  3. Renal: Follow BMET closely with diuresis. 4. Type II diabetes: Decrease ac Novolog. 5. Needs PT.  6. Would aim for home Sunday.  Would probably put her back on her prior outpatient regimen but with home health to make sure she is taking her meds correctly.   Larey Dresser 07/20/2013 1:36 PM

## 2013-07-21 DIAGNOSIS — N179 Acute kidney failure, unspecified: Secondary | ICD-10-CM

## 2013-07-21 LAB — BASIC METABOLIC PANEL
BUN: 45 mg/dL — ABNORMAL HIGH (ref 6–23)
CHLORIDE: 90 meq/L — AB (ref 96–112)
CO2: 37 mEq/L — ABNORMAL HIGH (ref 19–32)
Calcium: 10 mg/dL (ref 8.4–10.5)
Creatinine, Ser: 1.03 mg/dL (ref 0.50–1.10)
GFR calc non Af Amer: 55 mL/min — ABNORMAL LOW (ref >90)
GFR, EST AFRICAN AMERICAN: 63 mL/min — AB (ref >90)
Glucose, Bld: 150 mg/dL — ABNORMAL HIGH (ref 70–99)
POTASSIUM: 3.3 meq/L — AB (ref 3.7–5.3)
Sodium: 141 mEq/L (ref 137–147)

## 2013-07-21 LAB — GLUCOSE, CAPILLARY
GLUCOSE-CAPILLARY: 172 mg/dL — AB (ref 70–99)
GLUCOSE-CAPILLARY: 185 mg/dL — AB (ref 70–99)
GLUCOSE-CAPILLARY: 136 mg/dL — AB (ref 70–99)
Glucose-Capillary: 208 mg/dL — ABNORMAL HIGH (ref 70–99)
Glucose-Capillary: 111 mg/dL — ABNORMAL HIGH (ref 70–99)

## 2013-07-21 MED ORDER — POTASSIUM CHLORIDE CRYS ER 20 MEQ PO TBCR
40.0000 meq | EXTENDED_RELEASE_TABLET | Freq: Once | ORAL | Status: AC
  Administered 2013-07-21: 40 meq via ORAL

## 2013-07-21 MED ORDER — POTASSIUM CHLORIDE CRYS ER 20 MEQ PO TBCR: 40.0000 meq | EXTENDED_RELEASE_TABLET | Freq: Once | ORAL | Status: DC

## 2013-07-21 MED ORDER — TORSEMIDE 100 MG PO TABS
100.0000 mg | ORAL_TABLET | Freq: Two times a day (BID) | ORAL | Status: DC
  Filled 2013-07-21 (×2): qty 1

## 2013-07-21 MED ORDER — TORSEMIDE 100 MG PO TABS: 100.0000 mg | ORAL_TABLET | Freq: Two times a day (BID) | ORAL | Status: DC

## 2013-07-21 NOTE — Progress Notes (Signed)
PT Cancellation Note  Patient Details Name: Melissa Myers MRN: [id number] DOB: 1946-01-05   Cancelled Treatment:    Reason Eval/Treat Not Completed: Fatigue/lethargy limiting ability to participate.  Noting from chart and d/w patient, remains dizzy/weak when up and fairly lethargic during brief discussion this am, suspect related to low potassium?  Given weakness and difficulty moving around, we will continue to follow acutely and attempt to see next date for treatment.  Please consult RNCM for assist with setting up HHPT, feel pt will benefit and could potentially decrease risk for fall related injury or rehospitalization.  Thank you,   Herbie Drape 07/21/2013, 9:36 AM

## 2013-07-21 NOTE — Progress Notes (Addendum)
Patient Name: Melissa Myers Date of Encounter: 07/21/2013     Active Problems:   Acute on chronic diastolic CHF (congestive heart failure)    SUBJECTIVE  Feeling terrible. Weak and lightheaded and dizzy. No CP or SOB. No orthopnea or PND.   CURRENT MEDS . acetaZOLAMIDE  500 mg Oral Daily  . antiseptic oral rinse  15 mL Mouth Rinse BID  . aspirin EC  81 mg Oral Daily  . carvedilol  37.5 mg Oral BID WC  . DULoxetine  20 mg Oral Daily  . fenofibrate  160 mg Oral q morning - 10a  . heparin subcutaneous  5,000 Units Subcutaneous 3 times per day  . hydrALAZINE  25 mg Oral TID  . insulin aspart  40 Units Subcutaneous TID WC  . insulin glargine  60 Units Subcutaneous QHS  . omega-3 acid ethyl esters  1 g Oral BID  . potassium chloride  40 mEq Oral BID  . potassium chloride  40 mEq Oral Once  . pregabalin  50 mg Oral TID  . rOPINIRole  2 mg Oral BID  . torsemide  100 mg Oral BID    OBJECTIVE  Filed Vitals:   07/20/13 1128 07/20/13 1419 07/20/13 2119 07/21/13 0510  BP: 130/64 128/47 114/43 133/44  Pulse:  61 56 57  Temp:  97.8 F (36.6 C) 97.1 F (36.2 C) 97.4 F (36.3 C)  TempSrc:  Oral Oral Oral  Resp:  18 18   Height:      Weight:    262 lb 12.8 oz (119.205 kg)  SpO2:  93% 98% 96%    Intake/Output Summary (Last 24 hours) at 07/21/13 0828 Last data filed at 07/21/13 0651  Gross per 24 hour  Intake    840 ml  Output   2425 ml  Net  -1585 ml   Filed Weights   07/19/13 0619 07/20/13 0412 07/21/13 0510  Weight: 273 lb 13 oz (124.2 kg) 266 lb 5.1 oz (120.8 kg) 262 lb 12.8 oz (119.205 kg)    PHYSICAL EXAM  General: NAD  Neck: JVP cm, no thyromegaly or thyroid nodule.  Lungs: Clear to auscultation bilaterally with normal respiratory effort.  CV: Nondisplaced PMI. Heart regular S1/S2, no S3/S4, 2/6 early SEM. Trace edema bilaterally (improved).  Abdomen: Soft, nontender, no hepatosplenomegaly, no distention.  Neurologic: Alert and oriented x 3.  Psych:  Normal affect.  Extremities: No clubbing or cyanosis.   Accessory Clinical Findings  Basic Metabolic Panel  Recent Labs  07/20/13 0345 07/21/13 0427  NA 144 141  K 3.3* 3.3*  CL 93* 90*  CO2 38* 37*  GLUCOSE 154* 150*  BUN 37* 45*  CREATININE 0.93 1.03  CALCIUM 10.1 10.0     TELE  Sinus brady ; RBBB  Radiology/Studies  2D ECHO: 07/18/2013 LV EF: 60% - 65% Study Conclusions - Left ventricle: E/e&' is around 14 consistent with elevated LV filling pressures. The cavity size was normal. Systolic function was normal. The estimated ejection fraction was in the range of 60% to 65%. Wall motion was normal; there were no regional wall motion abnormalities. - Mitral valve: Calcified annulus. - Tricuspid valve: There was mild regurgitation. - Pulmonic valve: There was trivial regurgitation.    ASSESSMENT AND PLAN 1. Acute on chronic diastolic CHF: NYHA class IIIb symptoms, gradually worsening. Weight continued to rise despite aggressive outpatient diuretic regimen. She was admitted for IV diuresis on 07/17/13. She is doing well so far with good UOP and weight loss.  She still has some volume overload.  -- Given very vigorous response to IV diuretics and apparent minimal response to metolazone and torsemide at home, I am concerned that she may not be taking her diuretics as ordered at home (though she insists she is). Given OSA, I wonder if she falls asleep and forgets her pm meds.  -- Continue Coreg as there was still a mid-cavity gradient present on last echo (mild compared to past). ECHO repeated this admission. Reading above.  -- Now on torsemide 100 po bid; IV Lasix discontinued this AM. Will D/C all diuretics in the setting of soft blood pressures and weakness. -- Neg 27 lbs, Net output 16 L -- K 3.3 today. Will continue to supplement -- Needs home health to help with meds.  -- Appears euvolemic on exam. Not sure why feeling badly. BP soft and HR in 50s? MD to see  2.  Pulmonary: Suspect OHS/OSA. CO2 elevated on am BMET.  -- Intolerant CPAP.  -- Continue home oxygen at all times.  -- No more Diamox.  -- She has pulmonary followup (Dr. Elsworth Soho).   3. Renal: Creat stable. BMET followed closely with diuresis.   4. Type II diabetes: continue current regimen   5. Deconditioning: Needs PT.   6. Dispo: Planned for discharge today; however, not feeling well and we will likely need to keep today and hold diuretics. Will probably put her back on her prior outpatient regimen but with home health to make sure she is taking her meds correctly.    Signed, Perry Mount PA-C  Pager [phone]  Weak, dizzy.  BUN up.  Suspect I may have over-diuresed her a bit. Will hold BP-active meds today and hold diuretics.  Reassess in the morning, hopefully will feel better and can get her home on po diuretics.   Larey Dresser 07/21/2013 10:43 AM

## 2013-07-21 NOTE — Progress Notes (Addendum)
After breakfast this am patient c/o weakness , no energy, sleepy, also pt. Complain of dizziness, and light headed when moving around such as sitting up V/s check 97.7, HR 55,BP 104/50 sat 96%/2L. cbg  Is 185. Will  Notified MD. PA notified, pa saw patient. See order management for medication changes. Will continue to monitor patient.

## 2013-07-22 ENCOUNTER — Other Ambulatory Visit: Payer: Self-pay | Admitting: Cardiology

## 2013-07-22 ENCOUNTER — Other Ambulatory Visit (HOSPITAL_COMMUNITY): Payer: Self-pay | Admitting: Adult Health

## 2013-07-22 DIAGNOSIS — I5032 Chronic diastolic (congestive) heart failure: Secondary | ICD-10-CM

## 2013-07-22 DIAGNOSIS — I5033 Acute on chronic diastolic (congestive) heart failure: Secondary | ICD-10-CM

## 2013-07-22 LAB — BASIC METABOLIC PANEL
BUN: 46 mg/dL — AB (ref 6–23)
CALCIUM: 10.1 mg/dL (ref 8.4–10.5)
CO2: 33 mEq/L — ABNORMAL HIGH (ref 19–32)
Chloride: 90 mEq/L — ABNORMAL LOW (ref 96–112)
Creatinine, Ser: 0.95 mg/dL (ref 0.50–1.10)
GFR, EST AFRICAN AMERICAN: 70 mL/min — AB (ref >90)
GFR, EST NON AFRICAN AMERICAN: 60 mL/min — AB (ref >90)
GLUCOSE: 138 mg/dL — AB (ref 70–99)
Potassium: 3.1 mEq/L — ABNORMAL LOW (ref 3.7–5.3)
SODIUM: 139 meq/L (ref 137–147)

## 2013-07-22 LAB — GLUCOSE, CAPILLARY
Glucose-Capillary: 147 mg/dL — ABNORMAL HIGH (ref 70–99)
Glucose-Capillary: 205 mg/dL — ABNORMAL HIGH (ref 70–99)

## 2013-07-22 MED ORDER — POTASSIUM CHLORIDE CRYS ER 20 MEQ PO TBCR: 40.0000 meq | EXTENDED_RELEASE_TABLET | Freq: Three times a day (TID) | ORAL | Status: DC

## 2013-07-22 MED ORDER — POTASSIUM CHLORIDE CRYS ER 20 MEQ PO TBCR
40.0000 meq | EXTENDED_RELEASE_TABLET | ORAL | Status: AC
  Administered 2013-07-22 (×2): 40 meq via ORAL
  Filled 2013-07-22: qty 2

## 2013-07-22 MED ORDER — METOLAZONE 2.5 MG PO TABS
2.5000 mg | ORAL_TABLET | ORAL | Status: DC
  Filled 2013-07-22: qty 1

## 2013-07-22 MED ORDER — CARVEDILOL 25 MG PO TABS: 25.0000 mg | ORAL_TABLET | Freq: Two times a day (BID) | ORAL | Status: DC

## 2013-07-22 MED ORDER — TORSEMIDE 100 MG PO TABS
100.0000 mg | ORAL_TABLET | Freq: Two times a day (BID) | ORAL | Status: DC
  Administered 2013-07-22: 100 mg via ORAL
  Filled 2013-07-22 (×3): qty 1

## 2013-07-22 NOTE — Progress Notes (Signed)
Heart Failure Navigator Consult Note  Presentation: Melissa Myers is a 68 yo with history of HTN, DM, hyperlipidemia, OHS/OSA, and chronic dyspnea/diastolic CHF returns for cardiac evaluation. Patient had an echo in 3/11 showing moderate LVH and preserved LV systolic function. However, there was a very large LV mid-cavity gradient with valsalva. Patient developed quite significant exertional dyspnea to the point where she was short of breath walking around her house. She had a pulmonary evaluation with Dr. Elsworth Soho but no primary lung problems were identified. Patient had an ETT-myoview in 4/11 which was negative for ischemia or infarction. Right heart cath in 4/11 showed mildly elevated right heart filling pressures but normal PA pressure and normal PCWP. I started her on a beta blocker (Coreg) to try to lower her LV mid-cavity gradient (that likely occurs with exertion) and to better control BP. V/Q scan was negative for PE. PFTs from 5/13 showed a restrictive defect. Last echo in 4/14 with normal EF, mild mid-cavity LV gradient.  Symptomatically, Melissa Myers is a little more short of breath. Weight is up another 5 lbs despite diuretic adjustment. She can walk around her house without dypsnea. She is short of breath walking longer distances such as when she leaves the house. She uses a walker outside the house. She is short of breath with stairs. No orthopnea, PND, or chest pain. She continues to have painful neuropathic symptoms in her feet. She is on Lyrica. She is wearing oxygen at all times and is unable to tolerate CPAP. She does try to control her sodium intake but drinks a lot of fluid.    Past Medical History  Diagnosis Date  . Type II or unspecified type diabetes mellitus without mention of complication, not stated as uncontrolled     insulin dep  . Hyperlipidemia     hx rhabdo on statins  . Hypertension   . Diastolic CHF   . Chronic diarrhea     a/w nausea - felt related to IBS  .  RLS (restless legs syndrome)   . Osteoarthritis   . Stasis dermatitis   . GERD (gastroesophageal reflux disease)   . On home oxygen therapy     uses oxygen 2 liters min per [place] at night and prn during day  . Anemia   . Neuropathy     feet, toes and fingers  . Torn ligament     left knee for last 6 months  . Disc degeneration, lumbar   . OSA (obstructive sleep apnea)     05/2009 sleep study - refuses CPAP  . Deaf     left side only  . Shortness of breath   . Neuromuscular disorder     NEUROPATHY    History   Social History  . Marital Status: Married    Spouse Name: N/A    Number of Children: N/A  . Years of Education: N/A   Social History Main Topics  . Smoking status: Never Smoker   . Smokeless tobacco: Never Used  . Alcohol Use: No  . Drug Use: No  . Sexual Activity: No   Other Topics Concern  . None   Social History Narrative   Lives with spouse and mother. Retired Network engineer, now housewife    ECHO:Study Conclusions--07/18/13  - Left ventricle: E/e&' is around 35 consistent with elevated LV filling pressures. The cavity size was normal. Systolic function was normal. The estimated ejection fraction was in the range of 60% to 65%. Wall motion was normal; there were  no regional wall motion abnormalities. - Mitral valve: Calcified annulus. - Tricuspid valve: There was mild regurgitation. - Pulmonic valve: There was trivial regurgitation.   BNP    Component Value Date/Time   PROBNP 52.7 07/17/2013 1645    Education Assessment and Provision:  Detailed education and instructions provided on heart failure disease management including the following:  Signs and symptoms of Heart Failure When to call the physician Importance of daily weights Low sodium diet  Fluid restriction Medication management Anticipated future follow-up appointments  Patient education given on each of the above topics.  Patient acknowledges understanding and acceptance of all  instructions.  Melissa Myers lives with her husband and mother.  She says that she weighs daily and is aware of when to call the physician related to weight gain and symptoms.  She also says that she is aware and tries to eat a low sodium diet.  She does all the cooking in her household.  We reviewed fluid restriction.    Education Materials:  "Living Better With Heart Failure" Booklet, Daily Weight Tracker Tool    High Risk Criteria for Readmission and/or Poor Patient Outcomes:   EF <30%- No  2 or more admissions in 6 months- No  Difficult social situation- No  Demonstrates medication noncompliance- No    Barriers of Care:  Knowledge of HF and compliance   Discharge Planning:   Plans to discharge to home with her husband.  She will benefit from [facility] for continued education and assessment related to her symptoms.

## 2013-07-22 NOTE — Progress Notes (Signed)
Discharged home with husband. Verbalized understanding of discharge teaching.

## 2013-07-22 NOTE — Discharge Summary (Signed)
Advanced Heart Failure Team  Discharge Summary   Patient ID: Melissa Myers MRN: [id number], DOB/AGE: 23-Dec-1945 68 y.o. Admit date: 07/17/2013 D/C date:     07/22/2013   Primary Discharge Diagnoses:  1. A/C Diasolic Heart Failure- Diuresed 26 punds with IV lasix. Transitioned to torsemide 100 mg bid and metolazone 2.5 mg three times a week.  2. Suspected OHS/OSA-->Intolerant CPAP.  Follow up with Dr Elsworth Soho  3. Obesity  4. Hyopkalemia 5. DM II 6. Deconditioning -->  [facility] to follow for [facility]  Hospital Course:  Melissa Myers is a 68 yo with history of HTN, DM, hyperlipidemia, OHS/OSA, and chronic dyspnea/diastolic CHF returns for cardiac evaluation. Patient had an echo in 3/11 showing moderate LVH and preserved LV systolic function. However, there was a very large LV mid-cavity gradient with valsalva.  Patient had an ETT-myoview in 4/11 which was negative for ischemia or infarction. Right heart cath in 4/11 showed mildly elevated right heart filling pressures but normal PA pressure and normal PCWP. I started her on a beta blocker (Coreg) to try to lower her LV mid-cavity gradient (that likely occurs with exertion) and to better control BP. V/Q scan was negative for PE. PFTs from 5/13 showed a restrictive defect. Last echo in 4/14 with normal EF, mild mid-cavity LV gradient.   She was admitted from Dr Oleh Genin office with volume overload despite diuretic adjustments at home. She was diuresed with IV lasix and as she improved she transitioned back to 100 mg torsemide twice a day and Metolazone 2.5 mg Tues, Thur, and Saturday. Overall she diuresed 26 pounds. Renal function remained stable throughout hospitalization. May need to consider   She will continue to be followed closely by Dr Aundra Dubin with follow up this Friday at 3:00. She will have a BMET Thursday prior to appointment. [facility] to follow closely for [facility] and HHPT.     Discharge Weight Range:  Discharge [place]ig[place] 263 pounds.  Discharge Vitals: Blood  pressure 135/58, pulse 66, temperature 97.4 F (36.3 C), temperature source Oral, resp. rate 20, height 4\' 11"  (1.499 m), weight 263 lb (119.296 kg), SpO2 95.00%.  Labs: Lab Results  Component Value Date   WBC 9.9 07/17/2013   HGB 11.6* 07/17/2013   HCT 35.6* 07/17/2013   MCV 81.3 07/17/2013   PLT 306 07/17/2013    Recent Labs Lab 07/17/13 1645  07/22/13 0456  NA 142  < > 139  K 3.9  < > 3.1*  CL 93*  < > 90*  CO2 32  < > 33*  BUN 37*  < > 46*  CREATININE 0.97  < > 0.95  CALCIUM 9.3  < > 10.1  PROT 7.1  --   --   BILITOT 0.3  --   --   ALKPHOS 59  --   --   ALT 21  --   --   AST 38*  --   --   GLUCOSE 138*  < > 138*  < > = values in this interval not displayed. Lab Results  Component Value Date   CHOL 221* 08/03/2012   HDL 54.80 08/03/2012   LDLCALC 115 12/02/2009   TRIG 188.0* 08/03/2012   BNP (last 3 results)  Recent Labs  05/29/13 1143 07/01/13 1516 07/17/13 1645  PROBNP 19.0 29.0 52.7    Diagnostic Studies/Procedures   No results found.  Discharge Medications     Medication List         APIDRA 100 UNIT/ML injection  Generic drug:  insulin glulisine  Inject 60-70 Units into the skin 3 (three) times daily before meals. Take 60 units breakfast and lunch. Take 70 units at dinner.     aspirin 81 MG tablet  Take 81 mg by mouth daily.     carvedilol 25 MG tablet  Commonly known as:  COREG  Take 1 tablet (25 mg total) by mouth 2 (two) times daily with a meal.     cholecalciferol 1000 UNITS tablet  Commonly known as:  VITAMIN D  Take 1,000 Units by mouth daily.     CoQ10 30 MG Caps  Take 30 mg by mouth daily.     DULoxetine 20 MG capsule  Commonly known as:  CYMBALTA  Take 1 capsule (20 mg total) by mouth daily.     fenofibrate 160 MG tablet  Take 160 mg by mouth every morning.     hydrALAZINE 25 MG tablet  Commonly known as:  APRESOLINE  Take 1 tablet (25 mg total) by mouth 3 (three) times daily.     insulin glargine 100 UNIT/ML injection   Commonly known as:  LANTUS  Inject 60 Units into the skin at bedtime.     Magnesium 200 MG Tabs  Take 200 mg by mouth daily.     metolazone 2.5 MG tablet  Commonly known as:  ZAROXOLYN  Take 2.5 mg by mouth See admin instructions. On Tuesday, Thursday, Saturday  30 minutes before morning torsemide     omega-3 acid ethyl esters 1 G capsule  Commonly known as:  LOVAZA  Take 2 g by mouth 2 (two) times daily.     oxyCODONE-acetaminophen 5-325 MG per tablet  Commonly known as:  PERCOCET/ROXICET  Take 1 tablet by mouth every 6 (six) hours as needed for moderate pain or severe pain.     potassium chloride 10 MEQ tablet  Commonly known as:  K-DUR,KLOR-CON  Take 40 mEq by mouth 3 (three) times daily.     pregabalin 50 MG capsule  Commonly known as:  LYRICA  Take 1 capsule (50 mg total) by mouth 3 (three) times daily.     rOPINIRole 2 MG tablet  Commonly known as:  REQUIP  Take 2 mg by mouth 2 (two) times daily.     torsemide 20 MG tablet  Commonly known as:  DEMADEX  Take 5 tablets (100 mg total) by mouth 2 (two) times daily.        Disposition   The patient will be discharged in stable condition to home.     Discharge Instructions   Ambulatory referral to Nutrition and Diabetic Education    Complete by:  As directed      Contraindication to ACEI at discharge    Complete by:  As directed      Diet - low sodium heart healthy    Complete by:  As directed      Heart Failure patients record your daily weight using the same scale at the same time of day    Complete by:  As directed      Increase activity slowly    Complete by:  As directed           Follow-up Information   Follow up with Loralie Champagne, MD On 07/26/2013. (at 3:00)    Specialty:  Cardiology   Contact information:   [address] [place] [address] [phone]       Follow up with [facility]. (RN/PT)  Contact information:   [address] [place] [address] [phone]         Duration of Discharge Encounter: Greater than 35 minutes   Signed, Conrad Garrett  07/22/2013, 4:26 PM

## 2013-07-22 NOTE — Progress Notes (Signed)
Patient Name: Melissa Myers Date of Encounter: 07/22/2013     Active Problems:   Acute on chronic diastolic CHF (congestive heart failure)    SUBJECTIVE  Yesterday diuretics held due to dizziness. Weight up 1 pound. No dizziness today, creatinine stable.   CURRENT MEDS . antiseptic oral rinse  15 mL Mouth Rinse BID  . aspirin EC  81 mg Oral Daily  . DULoxetine  20 mg Oral Daily  . fenofibrate  160 mg Oral q morning - 10a  . heparin subcutaneous  5,000 Units Subcutaneous 3 times per day  . insulin aspart  40 Units Subcutaneous TID WC  . insulin glargine  60 Units Subcutaneous QHS  . omega-3 acid ethyl esters  1 g Oral BID  . potassium chloride  40 mEq Oral Once  . pregabalin  50 mg Oral TID  . rOPINIRole  2 mg Oral BID    OBJECTIVE  Filed Vitals:   07/21/13 0830 07/21/13 1418 07/21/13 2145 07/22/13 0711  BP: 104/50 111/37 120/57 139/45  Pulse: 55 60 59 63  Temp: 97.7 F (36.5 C) 98 F (36.7 C) 97.1 F (36.2 C) 97.6 F (36.4 C)  TempSrc: Oral Oral Axillary Oral  Resp:  16 17 20   Height:      Weight:    263 lb (119.296 kg)  SpO2: 96% 94% 95% 97%    Intake/Output Summary (Last 24 hours) at 07/22/13 0733 Last data filed at 07/21/13 2144  Gross per 24 hour  Intake    600 ml  Output   1007 ml  Net   -407 ml   Filed Weights   07/20/13 0412 07/21/13 0510 07/22/13 0711  Weight: 266 lb 5.1 oz (120.8 kg) 262 lb 12.8 oz (119.205 kg) 263 lb (119.296 kg)    PHYSICAL EXAM  General: NAD  Neck: JVP cm does not appear  Elevated. No thyromegaly or thyroid nodule.  Lungs: Clear to auscultation bilaterally with normal respiratory effort.  CV: Nondisplaced PMI. Heart regular S1/S2, no S3/S4, 2/6 early SEM. No lower extremity   Abdomen: Soft, nontender, no hepatosplenomegaly, no distention.  Neurologic: Alert and oriented x 3.  Psych: Normal affect.  Extremities: No clubbing or cyanosis.   Accessory Clinical Findings  Basic Metabolic Panel  Recent Labs   07/21/13 0427 07/22/13 0456  NA 141 139  K 3.3* 3.1*  CL 90* 90*  CO2 37* 33*  GLUCOSE 150* 138*  BUN 45* 46*  CREATININE 1.03 0.95  CALCIUM 10.0 10.1     TELE  Sinus brady ; RBBB  Radiology/Studies  2D ECHO: 07/18/2013 LV EF: 60% - 65% Study Conclusions - Left ventricle: E/e&' is around 14 consistent with elevated LV filling pressures. The cavity size was normal. Systolic function was normal. The estimated ejection fraction was in the range of 60% to 65%. Wall motion was normal; there were no regional wall motion abnormalities. - Mitral valve: Calcified annulus. - Tricuspid valve: There was mild regurgitation. - Pulmonic valve: There was trivial regurgitation.    ASSESSMENT AND PLAN 1. Acute on chronic diastolic CHF: NYHA class IIIb symptoms, gradually worsening. Weight continued to rise despite aggressive outpatient diuretic regimen. She was admitted for IV diuresis on 07/17/13.  -- Continue Coreg as there was still a mid-cavity gradient present on last echo (mild compared to past).  -- Volume status stable. Dizziness improved. Restart 100 mg torsemide bid.  -- Weight down 26 pounds.  -- K 3.1 today. Will continue to supplement -- Needs home  health to help with meds.  -- Appears euvolemic on exam now  2. Pulmonary: Suspect OHS/OSA. CO2 elevated on am BMET.  -- Intolerant CPAP.  -- Continue home oxygen at all times.  -- She has pulmonary followup (Dr. Elsworth Soho).   3. Renal: Creat stable.   4. Type II diabetes: continue current regimen   5. Deconditioning: Needs PT.   6. Dispo: D/C today with HH.   Signed, Conrad Culver PA-C  Advanced Heart Failure Team Pager [phone] (M-F; 7a - 4p)  Please contact [facility] for night-coverage after hours (4p -7a ) and weekends on [email or web address]  Patient seen with NP, agree with the above note.  She is not dizzy today, creatinine is stable.  She is down about 26 lbs.  I am going to let her go home today.  She will continue  her prior home cardiac meds.  Hopefully she will absorb her diuretics better with less gut edema.  She was on high doses of po diuretics at home but diuresed poorly.  However, she diuresed quite vigorously in the hospital.   Larey Dresser 07/22/2013 7:49 AM

## 2013-07-22 NOTE — Discharge Instructions (Signed)
Heart Failure Heart failure means your heart has trouble pumping blood. This makes it hard for your body to work well. Heart failure is usually a long-term (chronic) condition. You must take good care of yourself and follow your doctor's treatment plan. HOME CARE  Take your heart medicine as told by your doctor.  Do not stop taking medicine unless your doctor tells you to.  Do not skip any dose of medicine.  Refill your medicines before they run out.  Take other medicines only as told by your doctor or pharmacist.  Stay active if told by your doctor. The elderly and people with severe heart failure should talk with a doctor about physical activity.  Eat heart healthy foods. Choose foods that are without trans fat and are low in saturated fat, cholesterol, and salt (sodium). This includes fresh or frozen fruits and vegetables, fish, lean meats, fat-free or low-fat dairy foods, whole grains, and high-fiber foods. Lentils and dried peas and beans (legumes) are also good choices.  Limit salt if told by your doctor.  Cook in a healthy way. Roast, grill, broil, bake, poach, steam, or stir-fry foods.  Limit fluids as told by your doctor.  Weigh yourself every morning. Do this after you pee (urinate) and before you eat breakfast. Write down your weight to give to your doctor.  Take your blood pressure and write it down if your doctor tell you to.  Ask your doctor how to check your pulse. Check your pulse as told.  Lose weight if told by your doctor.  Stop smoking or chewing tobacco. Do not use gum or patches that help you quit without your doctor's approval.  Schedule and go to doctor visits as told.  Nonpregnant women should have no more than 1 drink a day. Men should have no more than 2 drinks a day. Talk to your doctor about drinking alcohol.  Stop illegal drug use.  Stay current with shots (immunizations).  Manage your health conditions as told by your doctor.  Learn to manage  your stress.  Rest when you are tired.  If it is really hot outside:  Avoid intense activities.  Use air conditioning or fans, or get in a cooler place.  Avoid caffeine and alcohol.  Wear loose-fitting, lightweight, and light-colored clothing.  If it is really cold outside:  Avoid intense activities.  Layer your clothing.  Wear mittens or gloves, a hat, and a scarf when going outside.  Avoid alcohol.  Learn about heart failure and get support as needed.  Get help to maintain or improve your quality of life and your ability to care for yourself as needed. GET HELP IF:   You gain 03 lb/1.4 kg or more in 1 day or 05 lb/2.3 kg in a week.  You are more short of breath than usual.  You cannot do your normal activities.  You tire easily.  You cough more than normal, especially with activity.  You have any or more puffiness (swelling) in areas such as your hands, feet, ankles, or belly (abdomen).  You cannot sleep because it is hard to breathe.  You feel like your heart is beating fast (palpitations).  You get dizzy or lightheaded when you stand up. GET HELP RIGHT AWAY IF:   You have trouble breathing.  There is a change in mental status, such as becoming less alert or not being able to focus.  You have chest pain or discomfort.  You faint. MAKE SURE YOU:   Understand these   instructions.  Will watch your condition.  Will get help right away if you are not doing well or get worse. Document Released: 11/17/2007 Document Revised: 06/04/2012 Document Reviewed: 09/08/2011 Exit[employer] Patient Information 2014 [employer].  

## 2013-07-23 DIAGNOSIS — I509 Heart failure, unspecified: Secondary | ICD-10-CM | POA: Diagnosis not present

## 2013-07-23 DIAGNOSIS — Z794 Long term (current) use of insulin: Secondary | ICD-10-CM | POA: Diagnosis not present

## 2013-07-23 DIAGNOSIS — Z9981 Dependence on supplemental oxygen: Secondary | ICD-10-CM | POA: Diagnosis not present

## 2013-07-23 DIAGNOSIS — I10 Essential (primary) hypertension: Secondary | ICD-10-CM | POA: Diagnosis not present

## 2013-07-23 DIAGNOSIS — I5033 Acute on chronic diastolic (congestive) heart failure: Secondary | ICD-10-CM | POA: Diagnosis not present

## 2013-07-23 DIAGNOSIS — G4730 Sleep apnea, unspecified: Secondary | ICD-10-CM | POA: Diagnosis not present

## 2013-07-23 DIAGNOSIS — IMO0001 Reserved for inherently not codable concepts without codable children: Secondary | ICD-10-CM | POA: Diagnosis not present

## 2013-07-24 ENCOUNTER — Telehealth: Payer: Self-pay | Admitting: *Deleted

## 2013-07-24 DIAGNOSIS — I509 Heart failure, unspecified: Secondary | ICD-10-CM | POA: Diagnosis not present

## 2013-07-24 DIAGNOSIS — I5033 Acute on chronic diastolic (congestive) heart failure: Secondary | ICD-10-CM | POA: Diagnosis not present

## 2013-07-24 DIAGNOSIS — Z794 Long term (current) use of insulin: Secondary | ICD-10-CM | POA: Diagnosis not present

## 2013-07-24 DIAGNOSIS — G4730 Sleep apnea, unspecified: Secondary | ICD-10-CM | POA: Diagnosis not present

## 2013-07-24 DIAGNOSIS — I10 Essential (primary) hypertension: Secondary | ICD-10-CM | POA: Diagnosis not present

## 2013-07-24 DIAGNOSIS — IMO0001 Reserved for inherently not codable concepts without codable children: Secondary | ICD-10-CM | POA: Diagnosis not present

## 2013-07-24 NOTE — Telephone Encounter (Signed)
[place] for verbal OT orders Will review pain mgmt at next OV thanks

## 2013-07-24 NOTE — Telephone Encounter (Signed)
Darnell from [facility] called requesting verbal orders for OT.  Further states pt is complaining of severe back pain causing ADL limitations, requesting whether Pain Management referral is an option.  Please advise

## 2013-07-25 NOTE — Telephone Encounter (Signed)
Left detailed message on VM.

## 2013-07-26 ENCOUNTER — Encounter: Payer: Self-pay | Admitting: Cardiology

## 2013-07-26 ENCOUNTER — Telehealth: Payer: Self-pay

## 2013-07-26 ENCOUNTER — Ambulatory Visit (INDEPENDENT_AMBULATORY_CARE_PROVIDER_SITE_OTHER): Payer: Medicare Other | Admitting: Cardiology

## 2013-07-26 VITALS — BP 152/70 | HR 64 | Ht 59.0 in | Wt 271.0 lb

## 2013-07-26 DIAGNOSIS — I5033 Acute on chronic diastolic (congestive) heart failure: Secondary | ICD-10-CM

## 2013-07-26 DIAGNOSIS — N179 Acute kidney failure, unspecified: Secondary | ICD-10-CM | POA: Diagnosis not present

## 2013-07-26 DIAGNOSIS — R0600 Dyspnea, unspecified: Secondary | ICD-10-CM

## 2013-07-26 DIAGNOSIS — R0989 Other specified symptoms and signs involving the circulatory and respiratory systems: Secondary | ICD-10-CM

## 2013-07-26 DIAGNOSIS — R0609 Other forms of dyspnea: Secondary | ICD-10-CM

## 2013-07-26 DIAGNOSIS — I5032 Chronic diastolic (congestive) heart failure: Secondary | ICD-10-CM | POA: Diagnosis not present

## 2013-07-26 LAB — BASIC METABOLIC PANEL
BUN: 102 mg/dL — AB (ref 6–23)
CALCIUM: 10.3 mg/dL (ref 8.4–10.5)
CO2: 35 mEq/L — ABNORMAL HIGH (ref 19–32)
CREATININE: 1.7 mg/dL — AB (ref 0.4–1.2)
Chloride: 86 mEq/L — ABNORMAL LOW (ref 96–112)
GFR: 31.55 mL/min — AB (ref >60.00)
GLUCOSE: 157 mg/dL — AB (ref 70–99)
Potassium: 3.4 mEq/L — ABNORMAL LOW (ref 3.5–5.1)
Sodium: 134 mEq/L — ABNORMAL LOW (ref 135–145)

## 2013-07-26 LAB — BRAIN NATRIURETIC PEPTIDE: PRO B NATRI PEPTIDE: 21 pg/mL (ref 0.0–100.0)

## 2013-07-26 MED ORDER — TORSEMIDE 20 MG PO TABS: 80.0000 mg | ORAL_TABLET | Freq: Two times a day (BID) | ORAL | Status: DC

## 2013-07-26 NOTE — Progress Notes (Signed)
Patient ID: Melissa Myers, female   DOB: [birth year], 68 y.o.   MRN: [id number] PCP: Dr. Asa Lente  68 yo with history of HTN, DM, hyperlipidemia, OHS/OSA, and chronic dyspnea/diastolic CHF returns for cardiac evaluation. Patient had an echo in 3/11 showing moderate LVH and preserved LV systolic function. However, there was a very large LV mid-cavity gradient with valsalva.  Patient developed quite significant exertional dyspnea to the point where she was short of breath walking around her house. She had a pulmonary evaluation with Dr. Elsworth Soho but no primary lung problems were identified. Patient had an ETT-myoview in 4/11 which was negative for ischemia or infarction. Right heart cath in 4/11 showed mildly elevated right heart filling pressures but normal PA pressure and normal PCWP. I started her on a beta blocker (Coreg) to try to lower her LV mid-cavity gradient (that likely occurs with exertion) and to better control BP.  V/Q scan was negative for PE.  PFTs from 5/13 showed a restrictive defect. Last echo in 5/15 showed EF 60-65% with normal RV size and systolic function.      When I saw her last in the office, Melissa Myers had gained more weight and was more short of breath despite very high diuretic doses.  Therefore, I admitted her to the hospital in 5/15 and diuresed her with IV Lasix + metolazone.  She is down 19 lbs today compared to her prior appointment.  She has much less lower extremity edema.  Her weight at home has been stable since discharge.  She is wearing oxygen at all times and is unable to tolerate CPAP.  She is short of breath after walking about 100-150 feet (stable).  She does have chronic orthopnea.  No chest pain.  She says that she is not missing any medications and has cut back on sodium intake a lot.   I sent labs today.  BUN came back at 105 with creatinine 1.7.  This is considerably higher than before.    Labs (4/11): TGs 539, HDL 58, LDL 128, K 5.2, creatinine 1.3, TSH  normal, BNP 14  Labs (5/11): K 3.9, creatinine 1.0  Labs (9/11): creatinine 1.1, BUN 43, LDL 145, HDL 60  Labs (10/11): K 4.3, creatinine 0.81, HCT 34, LDL 115, HDL 75  Labs (11/12): K 4.2, creatinine 1.58 => 3.0, HCT 32.2, BNP 172, LDL 97, TGs 980 Labs (12/12): K 3.4, creatinine 1.2 => 1.7 Labs (1/13): K 3.9, creatinine 1.1, TGs 281, LDL 121 Labs (2/13): K 3.3, creatinine 1.2 Labs (10/13): K 4.1, creatinine 0.85 Labs (4/14): K 3.7, creatinine 1.2, BUN 43, BNP 23 Labs (5/14): K 4.1, creatinine 1.0 Labs (7/14): K 4.6 Creatinine 1.0 BNP 39.0  Labs (8/14): K 3.5, creatinine 0.91, BNP 71 Labs (11/14): K 3.7, creatinine 0.9 Labs (12/14): K 4.1, creatinine 0.8, BNP 52 Labs (3/15): K 4, creatinine 0.9 Labs (4/15): K 3.7, creatinine 0.9 Labs (5/15): K 3.8, creatinine 0.9, BNP 29 Labs (6/15): K 3.4, BUN 105, creatinine 0.9 =>1.7  Allergies (verified):  1) ! Sulfa  2) Morphine   Past Medical History:  1. Diabetes mellitus, type II  2. Hyperlipidemia: She has been unable to tolerate statins (has been on Vytorin, lovastatin, and Crestor) due to what sounds like rhabdo: developed muscle weakness and was told she had "muscle damage" with each of these statins. She has been told not to take statins anymore.  3. Hypertension 4. Obesity  5. Restless Leg Syndrome  6. OA  7. peripheral neuropathy  8.  OHS/OSA: Uses supplemental oxygen, does not tolerate CPAP.   9. Chronic nausea/diarrhea: ? IBS  10. Diastolic CHF: Echo (9/50) with normal LV size, moderate LVH, EF 65-70%, LV mid-cavity gradient reaching 63 mmHg with valsalva but minimal at rest, grade I diastolic dysfunction, cannot estimate PA systolic pressure (no TR doppler signal), RV normal. RHC (4/11): Mean RA 11, RV 37/13, PA 33/15, mean PCWP 12, CI 3.3. Echo in 11/12 showed mild LVH, EF 65-70%, no LV mid-cavity gradient was mentioned.  Echo in 4/14 showed EF 65-70%, mild LVH, grade I diastolic dysfunction, PA systolic pressure 35 mmHg, mild LV  mid-cavity gradient.  Echo (5/15) with EF 60-65%, mild TR, normal RV size and systolic function.  11. ETT-myoview (4/11): 5'30", stopped due to fatigue, normal EF, no evidence for scar or ischemia. 12. V/Q scan 11/12: negative for PE.  Lower extremity venous doppler US (3/14): negative for DVT.  13. PFTs (5/13): FVC 50%, FEV1 62%, ratio 106%, DLCO 69%, TLC 69%.   Family History:  Family History of Alcoholism/Addiction (parent)  Family History Diabetes 1st degree relative (grandparent)  Family History High cholesterol (parent, grandparent)  Family History Hypertension (parent, grandparent)  Family History Lung cancer (grandparent)  Stomach cancer (grandmother)  Celiac Sprue daughter  Mother with MI at 67, father with MI at 27, uncle with MI at 86, brother with stents in his 56s, multiple aunts with MIs and CVAs   Social History:  Never Smoked  no alcohol  married, lives with spouse and her mother  retired Network engineer - now housewife  Alcohol Use - no  Illicit Drug Use - no  Review of Systems  All systems reviewed and negative except as per HPI.   ROS: All systems reviewed and negative except as per HPI.   Current Outpatient Prescriptions  Medication Sig Dispense Refill  . aspirin 81 MG tablet Take 81 mg by mouth daily.       . carvedilol (COREG) 25 MG tablet Take 1 tablet (25 mg total) by mouth 2 (two) times daily with a meal.  60 tablet  6  . cholecalciferol (VITAMIN D) 1000 UNITS tablet Take 1,000 Units by mouth daily.      . Coenzyme Q10 (COQ10) 30 MG CAPS Take 30 mg by mouth daily.       . DULoxetine (CYMBALTA) 20 MG capsule Take 1 capsule (20 mg total) by mouth daily.  30 capsule  3  . fenofibrate 160 MG tablet Take 160 mg by mouth every morning.      . hydrALAZINE (APRESOLINE) 25 MG tablet Take 1 tablet (25 mg total) by mouth 3 (three) times daily.  90 tablet  6  . insulin glargine (LANTUS) 100 UNIT/ML injection Inject 60 Units into the skin at bedtime.      . insulin  glulisine (APIDRA) 100 UNIT/ML injection Inject 60-70 Units into the skin 3 (three) times daily before meals. Take 60 units breakfast and lunch. Take 70 units at dinner.      . Magnesium 200 MG TABS Take 200 mg by mouth daily.   60 each    . metolazone (ZAROXOLYN) 2.5 MG tablet Take 2.5 mg by mouth See admin instructions. On Tuesday, Thursday, Saturday  30 minutes before morning torsemide      . omega-3 acid ethyl esters (LOVAZA) 1 G capsule Take 2 g by mouth 2 (two) times daily.      [name] oxyCODONE-acetaminophen (PERCOCET/ROXICET) 5-325 MG per tablet Take 1 tablet by mouth every 6 (six) hours as  needed for moderate pain or severe pain.      . potassium chloride (K-DUR,KLOR-CON) 10 MEQ tablet Take 40 mEq by mouth 3 (three) times daily.      . pregabalin (LYRICA) 50 MG capsule Take 1 capsule (50 mg total) by mouth 3 (three) times daily.  90 capsule  5  . rOPINIRole (REQUIP) 2 MG tablet Take 2 mg by mouth 2 (two) times daily.      [name] torsemide (DEMADEX) 20 MG tablet Take 5 tablets (100 mg total) by mouth 2 (two) times daily.       No current facility-administered medications for this visit.    BP 152/70  Pulse 64  Ht 4\' 11"  (1.499 m)  Wt 122.925 kg (271 lb)  BMI 54.71 kg/m2 General: NAD, obese  Neck: Thick, JVP 8, no thyromegaly or thyroid nodule.  Lungs: Clear to auscultation bilaterally with normal respiratory effort. CV: Nondisplaced PMI.  Heart regular S1/S2, no S3/S4, 2/6 SEM RUSB.  1+ chronic ankle edema.  No carotid bruit.  Abdomen: Soft, nontender, no hepatosplenomegaly, no distention.  Neurologic: Alert and oriented x 3.  Psych: Normal affect. Extremities: No clubbing/cyanosis    Assessment/Plan: 1. Chronic diastolic CHF:  NYHA class III symptoms, chronic.  Weight is down 19 lbs with diuresis in the office but BUN/creatinine much higher.  She is on her prior home dose of diuretic. I wonder if she was missing doses prior to hospital admission and is now taking everything, with resulting  renal dysfunction.  I noticed in the hospital that she seemed to be drowsy a lot (from untreated OSA likely), so I wonder if this led to her missing medications before and now she is taking them.    - Continue Coreg for history of LV mid-cavity gradient.  - Stop diuretics over the weekend.  Repeat BMET on Monday.  Will decide on restarting diuretics then.  Will likely start back on lower dose of torsemide (80 mg bid) and cut back on metolazone. 2. Pulmonary: Suspect OHS/OSA.   - Intolerant CPAP.    - Continue home oxygen at all times.   - She has pulmonary followup (Dr. Elsworth Soho). 3. Renal: AKI in setting of probable overdiuresis.  Hold diuretics as above.   Larey Dresser 07/26/2013

## 2013-07-26 NOTE — Telephone Encounter (Signed)
Received call from lab critical Bun 102.Dr Aundra Dubin aware.called pt several times to give pt Dr.McLeans instructions.unable to reach pt.STOP all diuretics and potassium trough the weekend. Drink plenty pf fluids. Come in to the office for repeat bmet on Monday. Restart torsemide 80mg  bid on Tues 6/9. Hold metolazone and potassium for now.  3rd attempt. reached pt pt given Dr.McLeans instructions pt verbalized understanding

## 2013-07-26 NOTE — Patient Instructions (Addendum)
Your physician has recommended you make the following change in your medication:  1) INCREASE O2 to 3 liters  Lab Today: Bmet, [place] physician recommends that you schedule a follow-up appointment in: 1 month

## 2013-07-27 DIAGNOSIS — Z794 Long term (current) use of insulin: Secondary | ICD-10-CM | POA: Diagnosis not present

## 2013-07-27 DIAGNOSIS — G4730 Sleep apnea, unspecified: Secondary | ICD-10-CM | POA: Diagnosis not present

## 2013-07-27 DIAGNOSIS — I509 Heart failure, unspecified: Secondary | ICD-10-CM | POA: Diagnosis not present

## 2013-07-27 DIAGNOSIS — I5033 Acute on chronic diastolic (congestive) heart failure: Secondary | ICD-10-CM | POA: Diagnosis not present

## 2013-07-27 DIAGNOSIS — I10 Essential (primary) hypertension: Secondary | ICD-10-CM | POA: Diagnosis not present

## 2013-07-27 DIAGNOSIS — IMO0001 Reserved for inherently not codable concepts without codable children: Secondary | ICD-10-CM | POA: Diagnosis not present

## 2013-07-28 DIAGNOSIS — IMO0001 Reserved for inherently not codable concepts without codable children: Secondary | ICD-10-CM | POA: Diagnosis not present

## 2013-07-28 DIAGNOSIS — G4730 Sleep apnea, unspecified: Secondary | ICD-10-CM | POA: Diagnosis not present

## 2013-07-28 DIAGNOSIS — I509 Heart failure, unspecified: Secondary | ICD-10-CM | POA: Diagnosis not present

## 2013-07-28 DIAGNOSIS — I10 Essential (primary) hypertension: Secondary | ICD-10-CM | POA: Diagnosis not present

## 2013-07-28 DIAGNOSIS — I5033 Acute on chronic diastolic (congestive) heart failure: Secondary | ICD-10-CM | POA: Diagnosis not present

## 2013-07-28 DIAGNOSIS — Z794 Long term (current) use of insulin: Secondary | ICD-10-CM | POA: Diagnosis not present

## 2013-07-29 ENCOUNTER — Telehealth: Payer: Self-pay | Admitting: Cardiology

## 2013-07-29 ENCOUNTER — Other Ambulatory Visit: Payer: Medicare Other

## 2013-07-29 DIAGNOSIS — I10 Essential (primary) hypertension: Secondary | ICD-10-CM

## 2013-07-29 DIAGNOSIS — I509 Heart failure, unspecified: Secondary | ICD-10-CM | POA: Diagnosis not present

## 2013-07-29 DIAGNOSIS — I5032 Chronic diastolic (congestive) heart failure: Secondary | ICD-10-CM

## 2013-07-29 DIAGNOSIS — G4730 Sleep apnea, unspecified: Secondary | ICD-10-CM | POA: Diagnosis not present

## 2013-07-29 DIAGNOSIS — I5033 Acute on chronic diastolic (congestive) heart failure: Secondary | ICD-10-CM | POA: Diagnosis not present

## 2013-07-29 DIAGNOSIS — IMO0001 Reserved for inherently not codable concepts without codable children: Secondary | ICD-10-CM | POA: Diagnosis not present

## 2013-07-29 DIAGNOSIS — Z794 Long term (current) use of insulin: Secondary | ICD-10-CM | POA: Diagnosis not present

## 2013-07-29 NOTE — Telephone Encounter (Signed)
She can go ahead and restart the torsemide 80 mg bid and metolazone at her prior dose.  Come for labs though.

## 2013-07-29 NOTE — Telephone Encounter (Signed)
New Prob    States pt has not been weighing herself. Pt has gained a large amount of weight in 1 week. Would like to speak to nurse regarding concerns.

## 2013-07-29 NOTE — Telephone Encounter (Signed)
Spoke with Olivia Mackie at [facility] regarding pt . Olivia Mackie states pt has gained weigh, LE edema and sob. On her last O/V on 6/5 pt's weigh was 271 lbs; today was 286 lbs. On Friday Due to her lab results pt was to hold diuretics for the weekend. Pt was to come today for labs and she was to restart Torsemide 80 mg twice a day on Tuesday 6/9. Pt did not come for labs today. She   will tried to come tomorrow to get  labs. [facility], advised pt  that if her symptoms get worse  to go to the ER.

## 2013-07-30 ENCOUNTER — Other Ambulatory Visit: Payer: Medicare Other

## 2013-07-30 ENCOUNTER — Encounter: Payer: Self-pay | Admitting: Endocrinology

## 2013-07-30 ENCOUNTER — Ambulatory Visit (INDEPENDENT_AMBULATORY_CARE_PROVIDER_SITE_OTHER): Payer: Medicare Other | Admitting: Endocrinology

## 2013-07-30 VITALS — BP 136/74 | HR 76 | Temp 98.0°F | Ht 59.0 in | Wt 289.0 lb

## 2013-07-30 DIAGNOSIS — I509 Heart failure, unspecified: Secondary | ICD-10-CM | POA: Diagnosis not present

## 2013-07-30 DIAGNOSIS — E1029 Type 1 diabetes mellitus with other diabetic kidney complication: Secondary | ICD-10-CM | POA: Diagnosis not present

## 2013-07-30 DIAGNOSIS — G4730 Sleep apnea, unspecified: Secondary | ICD-10-CM | POA: Diagnosis not present

## 2013-07-30 DIAGNOSIS — Z794 Long term (current) use of insulin: Secondary | ICD-10-CM | POA: Diagnosis not present

## 2013-07-30 DIAGNOSIS — E1065 Type 1 diabetes mellitus with hyperglycemia: Principal | ICD-10-CM

## 2013-07-30 DIAGNOSIS — IMO0001 Reserved for inherently not codable concepts without codable children: Secondary | ICD-10-CM | POA: Diagnosis not present

## 2013-07-30 DIAGNOSIS — I10 Essential (primary) hypertension: Secondary | ICD-10-CM | POA: Diagnosis not present

## 2013-07-30 DIAGNOSIS — I5033 Acute on chronic diastolic (congestive) heart failure: Secondary | ICD-10-CM | POA: Diagnosis not present

## 2013-07-30 LAB — BASIC METABOLIC PANEL
BUN: 34 mg/dL — AB (ref 6–23)
CALCIUM: 9.3 mg/dL (ref 8.4–10.5)
CO2: 30 mEq/L (ref 19–32)
CREATININE: 0.9 mg/dL (ref 0.4–1.2)
Chloride: 101 mEq/L (ref 96–112)
GFR: 67.02 mL/min (ref >60.00)
Glucose, Bld: 116 mg/dL — ABNORMAL HIGH (ref 70–99)
Potassium: 3.8 mEq/L (ref 3.5–5.1)
Sodium: 141 mEq/L (ref 135–145)

## 2013-07-30 LAB — HEMOGLOBIN A1C: Hgb A1c MFr Bld: 8.2 % — ABNORMAL HIGH (ref 4.6–6.5)

## 2013-07-30 NOTE — Telephone Encounter (Signed)
Returned call to patient Dr.McLean advised to restart Torsemide 80 mg twice a day.Advised to restart Metolazone at her prior dose.Patient stated prior dose 2.5 mg on Tue-Thurs-Sat.Advised Bmet today.Advised to keep appointment with Dr.McLean 08/22/13 and call back sooner if needed.

## 2013-07-30 NOTE — Telephone Encounter (Signed)
See my note below.  She can restart torsemide 80 mg bid and prior metolazone but needs BMET today.

## 2013-07-30 NOTE — Telephone Encounter (Signed)
Received call from patient she stated she has gained 3 lbs over night.Stated when she was discharged from hospital last week she weighed 266 lbs,this morning she weighs 289 lbs.Stated she noticed sob yesterday this morning she continues to be sob.Swelling coming back in lower legs.Stated she was told to stop metolazone,torsemide,potassium.Message sent to [place] for advice.

## 2013-07-30 NOTE — Patient Instructions (Signed)
Please come back for a follow-up appointment in 3 months.  check your blood sugar 2 times a day.  vary the time of day when you check, between before the 3 meals, and at bedtime.  also check if you have symptoms of your blood sugar being too high or too low.  please keep a record of the readings and bring it to your next appointment here.  please call us sooner if your blood sugar goes below 70, or if it stays over 200.   A diabetes blood test is requested for you today.  We'll contact you with results.

## 2013-07-30 NOTE — Telephone Encounter (Signed)
New Message:  Pt states she gained 3 lbs since last night. Pt is c/o of SOB.. States she is having it now... States she is very tired.Marland KitchenMarland Kitchen

## 2013-07-30 NOTE — Progress Notes (Signed)
Subjective:    Patient ID: Melissa Myers, female    DOB: [birth year], 68 y.o.   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  no cbg record, but states cbg's vary from 75-200's.  There is no trend throughout the day.  She is unable to cite any other factor, such as variable diet or activity, to account for this variation.   Past Medical History  Diagnosis Date  . Type II or unspecified type diabetes mellitus without mention of complication, not stated as uncontrolled     insulin dep  . Hyperlipidemia     hx rhabdo on statins  . Hypertension   . Diastolic CHF   . Chronic diarrhea     a/w nausea - felt related to IBS  . RLS (restless legs syndrome)   . Osteoarthritis   . Stasis dermatitis   . GERD (gastroesophageal reflux disease)   . On home oxygen therapy     uses oxygen 2 liters min per [place] at night and prn during day  . Anemia   . Neuropathy     feet, toes and fingers  . Torn ligament     left knee for last 6 months  . Disc degeneration, lumbar   . OSA (obstructive sleep apnea)     05/2009 sleep study - refuses CPAP  . Deaf     left side only  . Shortness of breath   . Neuromuscular disorder     NEUROPATHY    Past Surgical History  Procedure Laterality Date  . Tubal ligation  1980  . Cholecystectomy  1997  . Uterine polyp removal  2008  . Umbilical hernia repair  1995  . Tonsillectomy  1970  . Colonoscopy N/A 12/03/2012    Procedure: COLONOSCOPY;  Surgeon: Lafayette Dragon, MD;  Location: [facility];  Service: Endoscopy;  Laterality: N/A;    History   Social History  . Marital Status: Married    Spouse Name: N/A    Number of Children: N/A  . Years of Education: N/A   Occupational History  . Not on file.    Social History Main Topics  . Smoking status: Never Smoker   . Smokeless tobacco: Never Used  . Alcohol Use: No  . Drug Use: No  . Sexual Activity: No   Other Topics Concern  . Not on file   Social History Narrative   Lives with spouse and mother. Retired Network engineer, now housewife    Current Outpatient Prescriptions on File Prior to Visit  Medication Sig Dispense Refill  . aspirin 81 MG tablet Take 81 mg by mouth daily.       . carvedilol (COREG) 25 MG tablet Take 1 tablet (25 mg total) by mouth 2 (two) times daily with a meal.  60 tablet  6  . cholecalciferol (VITAMIN D) 1000 UNITS tablet Take 1,000 Units by mouth daily.      . Coenzyme Q10 (COQ10) 30 MG CAPS Take 30 mg by mouth daily.       . fenofibrate 160 MG tablet Take 160 mg by mouth every morning.      . hydrALAZINE (APRESOLINE) 25 MG tablet Take 1 tablet (25 mg total) by mouth 3 (three) times daily.  90 tablet  6  . insulin glargine (  LANTUS) 100 UNIT/ML injection Inject 60 Units into the skin at bedtime.      . insulin glulisine (APIDRA) 100 UNIT/ML injection Inject 60-70 Units into the skin 3 (three) times daily before meals. Take 60 units breakfast and lunch. Take 70 units at dinner.      . Magnesium 200 MG TABS Take 200 mg by mouth daily.   60 each    . metolazone (ZAROXOLYN) 2.5 MG tablet Take 2.5 mg on Tues-Thurs-Sat  100 tablet  6  . omega-3 acid ethyl esters (LOVAZA) 1 G capsule Take 2 g by mouth 2 (two) times daily.      [name] oxyCODONE-acetaminophen (PERCOCET/ROXICET) 5-325 MG per tablet Take 1 tablet by mouth every 6 (six) hours as needed for moderate pain or severe pain.      . pregabalin (LYRICA) 50 MG capsule Take 1 capsule (50 mg total) by mouth 3 (three) times daily.  90 capsule  5  . rOPINIRole (REQUIP) 2 MG tablet Take 2 mg by mouth 2 (two) times daily.      [name] torsemide (DEMADEX) 20 MG tablet Take 4 tablets ( 80 mg ) twice a day  240 tablet  6   No current facility-administered medications on file prior to  visit.    Allergies  Allergen Reactions  . Levemir [Insulin Detemir]     itching  . Morphine     REACTION: GI upset and headaches  . Sulfa Antibiotics Diarrhea  . Sulfonamide Derivatives Diarrhea  . Zinc Swelling and Rash    Family History  Problem Relation Age of Onset  . Heart disease Mother   . Heart disease Father   . Heart disease      family history  . Stomach cancer Paternal Grandmother   . Lung cancer Paternal Grandfather   . Colon cancer Neg Hx   . CVA      several aunts  . Heart attack Mother 67  . Heart attack Father 84  . Heart attack      several aunts and an uncle    BP 136/74  Pulse 76  Temp(Src) 98 F (36.7 C) (Oral)  Ht 4\' 11"  (1.499 m)  Wt 289 lb (131.09 kg)  BMI 58.34 kg/m2  SpO2 97%  Review of Systems She denies hypoglycemia since last ov.  She has gained weight, but this appears to be fluid-related.     Objective:   Physical Exam Pulses: dorsalis pedis absent bilat, but that may be due to edema.   Feet: no deformity. normal color and temp, but there thickened skin there. trace bilat leg edema. There is bilateral onychomycosis, and heavy calluses. Healed ulcers at both ant tibial areas.  Skin: no ulcer on the feet.   Neuro: sensation is intact to touch on the feet, but decreased from normal.    Lab Results  Component Value Date   HGBA1C 8.2* 07/30/2013       Assessment & Plan:  DM: moderate exacerbation.  i need to see cbg record in order to safely increase insulin.   Noncompliance with cbg recording: I'll work around this as best I can CHF: this limits exercise rx:  This impairs the ability to achieve glycemic control.  I'll work around this as best I can.     Patient is advised the following: Patient Instructions  Please come back for a follow-up appointment in 3 months.  check your blood sugar 2 times a day.  vary the time of day when you check, between  before the 3 meals, and at bedtime.  also check if you have symptoms of your  blood sugar being too high or too low.  please keep a record of the readings and bring it to your next appointment here.  please call us sooner if your blood sugar goes below 70, or if it stays over 200.   A diabetes blood test is requested for you today.  We'll contact you with results.

## 2013-07-31 ENCOUNTER — Encounter (HOSPITAL_COMMUNITY): Payer: Self-pay | Admitting: Emergency Medicine

## 2013-07-31 ENCOUNTER — Emergency Department (HOSPITAL_COMMUNITY)
Admission: EM | Admit: 2013-07-31 | Discharge: 2013-08-01 | Disposition: A | Discharge status: Home / Self Care | Payer: Medicare Other | Attending: Emergency Medicine | Admitting: Emergency Medicine

## 2013-07-31 ENCOUNTER — Telehealth: Payer: Self-pay | Admitting: Cardiology

## 2013-07-31 DIAGNOSIS — M1990 Unspecified osteoarthritis, unspecified site: Secondary | ICD-10-CM | POA: Insufficient documentation

## 2013-07-31 DIAGNOSIS — Z7982 Long term (current) use of aspirin: Secondary | ICD-10-CM | POA: Insufficient documentation

## 2013-07-31 DIAGNOSIS — Z79899 Other long term (current) drug therapy: Secondary | ICD-10-CM | POA: Insufficient documentation

## 2013-07-31 DIAGNOSIS — R079 Chest pain, unspecified: Secondary | ICD-10-CM | POA: Diagnosis not present

## 2013-07-31 DIAGNOSIS — E785 Hyperlipidemia, unspecified: Secondary | ICD-10-CM | POA: Diagnosis not present

## 2013-07-31 DIAGNOSIS — R600 Localized edema: Secondary | ICD-10-CM

## 2013-07-31 DIAGNOSIS — E119 Type 2 diabetes mellitus without complications: Secondary | ICD-10-CM | POA: Diagnosis not present

## 2013-07-31 DIAGNOSIS — I10 Essential (primary) hypertension: Secondary | ICD-10-CM | POA: Diagnosis not present

## 2013-07-31 DIAGNOSIS — Z9981 Dependence on supplemental oxygen: Secondary | ICD-10-CM | POA: Insufficient documentation

## 2013-07-31 DIAGNOSIS — M51379 Other intervertebral disc degeneration, lumbosacral region without mention of lumbar back pain or lower extremity pain: Secondary | ICD-10-CM | POA: Insufficient documentation

## 2013-07-31 DIAGNOSIS — I5030 Unspecified diastolic (congestive) heart failure: Secondary | ICD-10-CM | POA: Insufficient documentation

## 2013-07-31 DIAGNOSIS — Z862 Personal history of diseases of the blood and blood-forming organs and certain disorders involving the immune mechanism: Secondary | ICD-10-CM | POA: Insufficient documentation

## 2013-07-31 DIAGNOSIS — K219 Gastro-esophageal reflux disease without esophagitis: Secondary | ICD-10-CM | POA: Insufficient documentation

## 2013-07-31 DIAGNOSIS — Z794 Long term (current) use of insulin: Secondary | ICD-10-CM | POA: Insufficient documentation

## 2013-07-31 DIAGNOSIS — R609 Edema, unspecified: Secondary | ICD-10-CM | POA: Insufficient documentation

## 2013-07-31 DIAGNOSIS — M5137 Other intervertebral disc degeneration, lumbosacral region: Secondary | ICD-10-CM | POA: Insufficient documentation

## 2013-07-31 DIAGNOSIS — G2581 Restless legs syndrome: Secondary | ICD-10-CM | POA: Insufficient documentation

## 2013-07-31 DIAGNOSIS — R0989 Other specified symptoms and signs involving the circulatory and respiratory systems: Secondary | ICD-10-CM | POA: Diagnosis not present

## 2013-07-31 NOTE — ED Notes (Signed)
Edema to bilateral lower legs. Pt. States this edema is worse today.

## 2013-07-31 NOTE — ED Notes (Signed)
Per EMS, pt was discharged Monday with CHF, started on lasix yesterday. Lower extremity swelling and had pinpoint left sided chest pain relieved with 1 nitro. 97% on 4L. 324 ASA and 1 nitro given.

## 2013-07-31 NOTE — Telephone Encounter (Signed)
Calling regarding intermittent shortness of breath and chest pain. Took 2 aspirin and chest pain resolved. Restarted diuretics as described in recent telephone notes and reports improvement in weight (5 lbs). Recently hospitalized. Recommended she seek urgent care if symptoms persist or worsen. Verbalized agreement and understanding of the plan. FYI to Heart failure

## 2013-08-01 ENCOUNTER — Emergency Department (HOSPITAL_COMMUNITY): Payer: Medicare Other

## 2013-08-01 ENCOUNTER — Telehealth: Payer: Self-pay | Admitting: Cardiology

## 2013-08-01 DIAGNOSIS — I5033 Acute on chronic diastolic (congestive) heart failure: Secondary | ICD-10-CM | POA: Diagnosis not present

## 2013-08-01 DIAGNOSIS — G4730 Sleep apnea, unspecified: Secondary | ICD-10-CM | POA: Diagnosis not present

## 2013-08-01 DIAGNOSIS — IMO0001 Reserved for inherently not codable concepts without codable children: Secondary | ICD-10-CM | POA: Diagnosis not present

## 2013-08-01 DIAGNOSIS — I10 Essential (primary) hypertension: Secondary | ICD-10-CM | POA: Diagnosis not present

## 2013-08-01 DIAGNOSIS — Z794 Long term (current) use of insulin: Secondary | ICD-10-CM | POA: Diagnosis not present

## 2013-08-01 DIAGNOSIS — I509 Heart failure, unspecified: Secondary | ICD-10-CM | POA: Diagnosis not present

## 2013-08-01 DIAGNOSIS — R0989 Other specified symptoms and signs involving the circulatory and respiratory systems: Secondary | ICD-10-CM | POA: Diagnosis not present

## 2013-08-01 LAB — COMPREHENSIVE METABOLIC PANEL
ALBUMIN: 3.1 g/dL — AB (ref 3.5–5.2)
ALT: 21 U/L (ref 0–35)
AST: 32 U/L (ref 0–37)
Alkaline Phosphatase: 59 U/L (ref 39–117)
BUN: 58 mg/dL — ABNORMAL HIGH (ref 6–23)
CALCIUM: 9.3 mg/dL (ref 8.4–10.5)
CHLORIDE: 91 meq/L — AB (ref 96–112)
CO2: 34 mEq/L — ABNORMAL HIGH (ref 19–32)
Creatinine, Ser: 1.13 mg/dL — ABNORMAL HIGH (ref 0.50–1.10)
GFR calc Af Amer: 57 mL/min — ABNORMAL LOW (ref >90)
GFR calc non Af Amer: 49 mL/min — ABNORMAL LOW (ref >90)
Glucose, Bld: 110 mg/dL — ABNORMAL HIGH (ref 70–99)
Potassium: 2.9 mEq/L — CL (ref 3.7–5.3)
Sodium: 143 mEq/L (ref 137–147)
Total Protein: 7.4 g/dL (ref 6.0–8.3)

## 2013-08-01 LAB — CBC WITH DIFFERENTIAL/PLATELET
BASOS ABS: 0 10*3/uL (ref 0.0–0.1)
BASOS PCT: 0 % (ref 0–1)
Eosinophils Absolute: 0.2 10*3/uL (ref 0.0–0.7)
Eosinophils Relative: 2 % (ref 0–5)
HCT: 37.2 % (ref 36.0–46.0)
Hemoglobin: 11.7 g/dL — ABNORMAL LOW (ref 12.0–15.0)
Lymphocytes Relative: 28 % (ref 12–46)
Lymphs Abs: 2.8 10*3/uL (ref 0.7–4.0)
MCH: 25.7 pg — ABNORMAL LOW (ref 26.0–34.0)
MCHC: 31.5 g/dL (ref 30.0–36.0)
MCV: 81.8 fL (ref 78.0–100.0)
MONO ABS: 0.7 10*3/uL (ref 0.1–1.0)
Monocytes Relative: 7 % (ref 3–12)
NEUTROS ABS: 6.2 10*3/uL (ref 1.7–7.7)
Neutrophils Relative %: 63 % (ref 43–77)
PLATELETS: 283 10*3/uL (ref 150–400)
RBC: 4.55 MIL/uL (ref 3.87–5.11)
RDW: 16.5 % — AB (ref 11.5–15.5)
WBC: 9.8 10*3/uL (ref 4.0–10.5)

## 2013-08-01 LAB — PRO B NATRIURETIC PEPTIDE: PRO B NATRI PEPTIDE: 125.1 pg/mL — AB (ref 0–125)

## 2013-08-01 LAB — I-STAT TROPONIN, ED
Troponin i, poc: 0.01 ng/mL (ref 0.00–0.08)
Troponin i, poc: 0.01 ng/mL (ref 0.00–0.08)

## 2013-08-01 MED ORDER — POTASSIUM CHLORIDE 10 MEQ/100ML IV SOLN
10.0000 meq | Freq: Once | INTRAVENOUS | Status: AC
  Administered 2013-08-01: 10 meq via INTRAVENOUS
  Filled 2013-08-01: qty 100

## 2013-08-01 MED ORDER — FUROSEMIDE 10 MG/ML IJ SOLN: 80.0000 mg | Freq: Once | INTRAMUSCULAR | Status: DC

## 2013-08-01 MED ORDER — FUROSEMIDE 20 MG PO TABS
40.0000 mg | ORAL_TABLET | Freq: Once | ORAL | Status: AC
  Administered 2013-08-01: 40 mg via ORAL
  Filled 2013-08-01: qty 2

## 2013-08-01 NOTE — Telephone Encounter (Signed)
Please have her seen by Melissa Myers next week on Monday in CHF clinic as I will be out of town.

## 2013-08-01 NOTE — Telephone Encounter (Signed)
Per Dr Ranee Gosselin KCL 40 mEq tid on metolazone days and KCL 40 mEq bid on non-metolazone days. Follow up appt next week with NP/PA in Heart Failure Clinic--BMET at that time. Pt and HHN advised, verbalized understanding.

## 2013-08-01 NOTE — Telephone Encounter (Signed)
New message   Home health in the home now.   Patient was seen in the emergency room last for chest pain . C/O weight still up 284 . Edema in legs , some dual chest pain . Very sob .  Blood pressure  126/70 , 96 % , breathe sound clear.

## 2013-08-01 NOTE — Telephone Encounter (Signed)
[facility] Nurse calling about pt.

## 2013-08-01 NOTE — Telephone Encounter (Signed)
Spoke with patient and HHN, Olivia Mackie. Pt's weight is down 5 pounds since Monday, lungs are clear, O2 sat 96%, pt continues to be SOB. Reviewed with Dr Orpah Greek to continue torsemide 80mg  two times a day and metolazone 2.5mg  Tu-Thur-Sat as she was instructed to done 07/30/13. Pt states she has not been taking any KCL in at least a week.

## 2013-08-01 NOTE — ED Provider Notes (Signed)
CSN: [id number]     Arrival date & time 07/31/13  2251 History   First MD Initiated Contact with Patient 07/31/13 2349     Chief Complaint  Patient presents with  . Chest Pain     (Consider location/radiation/quality/duration/timing/severity/associated sxs/prior Treatment) HPI Comments: Patient is a 68 year old female with past medical history of congestive heart failure, high cholesterol, hypertension. She presents today with complaints of shortness of breath and increased leg swelling. She denies any fevers or chills. She denies any nausea, vomiting, or diarrhea. She does admit to some chest discomfort which she experienced in the ambulance in route. This discomfort was relieved with nitroglycerin.  Patient is a 68 y.o. female presenting with shortness of breath. The history is provided by the patient.  Shortness of Breath Severity:  Moderate Onset quality:  Gradual Duration:  2 days Timing:  Constant Progression:  Worsening Chronicity:  Recurrent Context: activity   Relieved by:  Nothing Worsened by:  Nothing tried Ineffective treatments:  None tried   Past Medical History  Diagnosis Date  . Type II or unspecified type diabetes mellitus without mention of complication, not stated as uncontrolled     insulin dep  . Hyperlipidemia     hx rhabdo on statins  . Hypertension   . Diastolic CHF   . Chronic diarrhea     a/w nausea - felt related to IBS  . RLS (restless legs syndrome)   . Osteoarthritis   . Stasis dermatitis   . GERD (gastroesophageal reflux disease)   . On home oxygen therapy     uses oxygen 2 liters min per [place] at night and prn during day  . Anemia   . Neuropathy     feet, toes and fingers  . Torn ligament     left knee for last 6 months  . Disc degeneration, lumbar   . OSA (obstructive sleep apnea)     05/2009 sleep study - refuses CPAP  . Deaf     left side only  . Shortness of breath   . Neuromuscular disorder     NEUROPATHY   Past Surgical History   Procedure Laterality Date  . Tubal ligation  1980  . Cholecystectomy  1997  . Uterine polyp removal  2008  . Umbilical hernia repair  1995  . Tonsillectomy  1970  . Colonoscopy N/A 12/03/2012    Procedure: COLONOSCOPY;  Surgeon: Lafayette Dragon, MD;  Location: [facility];  Service: Endoscopy;  Laterality: N/A;   Family History  Problem Relation Age of Onset  . Heart disease Mother   . Heart disease Father   . Heart disease      family history  . Stomach cancer Paternal Grandmother   . Lung cancer Paternal Grandfather   . Colon cancer Neg Hx   . CVA      several aunts  . Heart attack Mother 15  . Heart attack Father 52  . Heart attack      several aunts and an uncle   History  Substance Use Topics  . Smoking status: Never Smoker   . Smokeless tobacco: Never Used  . Alcohol Use: No   OB History   Grav Para Term Preterm Abortions TAB SAB Ect Mult Living                 Review of Systems  Respiratory: Positive for shortness of breath.   All other systems reviewed and are negative.     Allergies  Levemir; Morphine;  Sulfa antibiotics; Sulfonamide derivatives; and Zinc  Home Medications   Prior to Admission medications   Medication Sig Start Date End Date Taking? Authorizing Provider  aspirin 81 MG tablet Take 81 mg by mouth daily.    Yes Historical Provider, MD  carvedilol (COREG) 25 MG tablet Take 1 tablet (25 mg total) by mouth 2 (two) times daily with a meal. 07/22/13  Yes Amy D Clegg, NP  cholecalciferol (VITAMIN D) 1000 UNITS tablet Take 1,000 Units by mouth daily.   Yes Historical Provider, MD  Coenzyme Q10 (COQ10) 30 MG CAPS Take 30 mg by mouth daily.    Yes Historical Provider, MD  fenofibrate 160 MG tablet Take 160 mg by mouth every morning.   Yes Historical Provider, MD  hydrALAZINE (APRESOLINE) 25 MG tablet Take 1 tablet (25 mg total) by mouth 3 (three) times daily. 01/31/13  Yes Larey Dresser, MD  insulin glargine (LANTUS) 100 UNIT/ML injection Inject  60 Units into the skin at bedtime.   Yes Historical Provider, MD  insulin glulisine (APIDRA) 100 UNIT/ML injection Inject 60-70 Units into the skin See admin instructions. Take 60 units before breakfast and lunch, and 70 units before dinner.   Yes Historical Provider, MD  Magnesium 200 MG TABS Take 200 mg by mouth daily.  05/29/12  Yes Larey Dresser, MD  metolazone (ZAROXOLYN) 2.5 MG tablet Take 2.5 mg on Tues-Thurs-Sat 07/30/13  Yes Larey Dresser, MD  metolazone (ZAROXOLYN) 2.5 MG tablet Take 2.5 mg by mouth 3 (three) times a week. Take on Tuesday, Thursday and Saturday.   Yes Historical Provider, MD  omega-3 acid ethyl esters (LOVAZA) 1 G capsule Take 2 g by mouth 2 (two) times daily.   Yes Historical Provider, MD  oxyCODONE-acetaminophen (PERCOCET/ROXICET) 5-325 MG per tablet Take 1 tablet by mouth every 6 (six) hours as needed for moderate pain or severe pain.   Yes Historical Provider, MD  pregabalin (LYRICA) 50 MG capsule Take 1 capsule (50 mg total) by mouth 3 (three) times daily. 05/16/13  Yes Rowe Clack, MD  rOPINIRole (REQUIP) 2 MG tablet Take 2 mg by mouth 2 (two) times daily. 06/27/12  Yes Rowe Clack, MD  torsemide (DEMADEX) 20 MG tablet Take 80 mg by mouth 2 (two) times daily.   Yes Historical Provider, MD   BP 131/51  Pulse 65  Temp(Src) 98.4 F (36.9 C) (Oral)  Resp 16  SpO2 96% Physical Exam  Nursing note and vitals reviewed. Constitutional: She is oriented to person, place, and time. She appears well-developed and well-nourished. No distress.  HENT:  Head: Normocephalic and atraumatic.  Mouth/Throat: Oropharynx is clear and moist.  Neck: Normal range of motion. Neck supple.  Cardiovascular: Normal rate, regular rhythm and normal heart sounds.   No murmur heard. Pulmonary/Chest: Effort normal and breath sounds normal.  Abdominal: Soft. Bowel sounds are normal.  Musculoskeletal: Normal range of motion. She exhibits edema.  There is 2-3+ lower extremity edema.   Neurological: She is alert and oriented to person, place, and time.  Skin: Skin is warm and dry. She is not diaphoretic.    ED Course  Procedures (including critical care time) Labs Review Labs Reviewed  CBC WITH DIFFERENTIAL - Abnormal; Notable for the following:    Hemoglobin 11.7 (*)    MCH 25.7 (*)    RDW 16.5 (*)    All other components within normal limits  COMPREHENSIVE METABOLIC PANEL - Abnormal; Notable for the following:    Potassium 2.9 (*)  Chloride 91 (*)    CO2 34 (*)    Glucose, Bld 110 (*)    BUN 58 (*)    Creatinine, Ser 1.13 (*)    Albumin 3.1 (*)    Total Bilirubin <0.2 (*)    GFR calc non Af Amer 49 (*)    GFR calc Af Amer 57 (*)    All other components within normal limits  PRO B NATRIURETIC PEPTIDE - Abnormal; Notable for the following:    Pro B Natriuretic peptide (BNP) 125.1 (*)    All other components within normal limits  I-STAT TROPOININ, ED  Randolm Idol, ED    Imaging Review Dg Chest Port 1 View  08/01/2013   CLINICAL DATA:  Shortness of breath.  History of CHF.  EXAM: PORTABLE CHEST - 1 VIEW  COMPARISON:  07/11/2012  FINDINGS: Shallow inspiration. Mild cardiac enlargement with mild central pulmonary vascular congestion. No significant edema or infiltration. No blunting of costophrenic angles. No pneumothorax. Similar appearance to previous study.  IMPRESSION: Cardiac enlargement with mild pulmonary vascular congestion. No edema or consolidation.   Electronically Signed   By: Lucienne Capers M.D.   On: 08/01/2013 00:57     EKG Interpretation   Date/Time:  Wednesday July 31 2013 22:59:18 EDT Ventricular Rate:  65 PR Interval:  180 QRS Duration: 150 QT Interval:  444 QTC Calculation: 461 R Axis:   -106 Text Interpretation:  Normal sinus rhythm Right bundle branch block  Inferior infarct , age undetermined Anterior infarct , age undetermined  Abnormal ECG Confirmed by DELOS  MD, Patric Buckhalter ([id number]) on 08/01/2013 3:15:54  AM      MDM    Final diagnoses:  None    Vision presents with difficulty breathing and leg swelling. She experienced an episode of chest discomfort by EMS in route which was relieved with nitroglycerin. She has remained pain free while in the emergency department an EKG is unchanged and troponin is negative x2. Her chest x-ray reveals pulmonary vascular congestion which is mild with no evidence for pulmonary edema. She was given IV Lasix and also received IV potassium as her potassium was 2.9. Her BNP is mildly elevated. She is feeling better and requesting to be discharged. There is no hypoxia and she has oxygen at home. I see no emergent need for admission and feel as though she is appropriate for discharge. She is to followup with her primary doctor in the next few days for a recheck and return if she worsens.    Veryl Speak, MD 08/01/13 [id number]

## 2013-08-01 NOTE — ED Notes (Signed)
Critical potasium of 2.9 told to Dr. Stark Jock by this RN.

## 2013-08-01 NOTE — Discharge Instructions (Signed)
Continue your medications as before.  Turn to the emergency department if you develop severe chest pain, difficulty breathing, or other new and concerning symptoms.   Chest Pain (Nonspecific) It is often hard to give a specific diagnosis for the cause of chest pain. There is always a chance that your pain could be related to something serious, such as a heart attack or a blood clot in the lungs. You need to follow up with your caregiver for further evaluation. CAUSES   Heartburn.  Pneumonia or bronchitis.  Anxiety or stress.  Inflammation around your heart (pericarditis) or lung (pleuritis or pleurisy).  A blood clot in the lung.  A collapsed lung (pneumothorax). It can develop suddenly on its own (spontaneous pneumothorax) or from injury (trauma) to the chest.  Shingles infection (herpes zoster virus). The chest wall is composed of bones, muscles, and cartilage. Any of these can be the source of the pain.  The bones can be bruised by injury.  The muscles or cartilage can be strained by coughing or overwork.  The cartilage can be affected by inflammation and become sore (costochondritis). DIAGNOSIS  Lab tests or other studies, such as X-rays, electrocardiography, stress testing, or cardiac imaging, may be needed to find the cause of your pain.  TREATMENT   Treatment depends on what may be causing your chest pain. Treatment may include:  Acid blockers for heartburn.  Anti-inflammatory medicine.  Pain medicine for inflammatory conditions.  Antibiotics if an infection is present.  You may be advised to change lifestyle habits. This includes stopping smoking and avoiding alcohol, caffeine, and chocolate.  You may be advised to keep your head raised (elevated) when sleeping. This reduces the chance of acid going backward from your stomach into your esophagus.  Most of the time, nonspecific chest pain will improve within 2 to 3 days with rest and mild pain medicine. HOME  CARE INSTRUCTIONS   If antibiotics were prescribed, take your antibiotics as directed. Finish them even if you start to feel better.  For the next few days, avoid physical activities that bring on chest pain. Continue physical activities as directed.  Do not smoke.  Avoid drinking alcohol.  Only take over-the-counter or prescription medicine for pain, discomfort, or fever as directed by your caregiver.  Follow your caregiver's suggestions for further testing if your chest pain does not go away.  Keep any follow-up appointments you made. If you do not go to an appointment, you could develop lasting (chronic) problems with pain. If there is any problem keeping an appointment, you must call to reschedule. SEEK MEDICAL CARE IF:   You think you are having problems from the medicine you are taking. Read your medicine instructions carefully.  Your chest pain does not go away, even after treatment.  You develop a rash with blisters on your chest. SEEK IMMEDIATE MEDICAL CARE IF:   You have increased chest pain or pain that spreads to your arm, neck, jaw, back, or abdomen.  You develop shortness of breath, an increasing cough, or you are coughing up blood.  You have severe back or abdominal pain, feel nauseous, or vomit.  You develop severe weakness, fainting, or chills.  You have a fever. THIS IS AN EMERGENCY. Do not wait to see if the pain will go away. Get medical help at once. Call your local emergency services (911 in [place] Do not drive yourself to the hospital. MAKE SURE YOU:   Understand these instructions.  Will watch your condition.  Will  get help right away if you are not doing well or get worse. Document Released: 11/17/2004 Document Revised: 05/02/2011 Document Reviewed: 09/13/2007 [facility] Patient Information 2014 [place].  Edema Edema is an abnormal build-up of fluids in tissues. Because this is partly dependent on gravity (water flows to the lowest place),  it is more common in the legs and thighs (lower extremities). It is also common in the looser tissues, like around the eyes. Painless swelling of the feet and ankles is common and increases as a person ages. It may affect both legs and may include the calves or even thighs. When squeezed, the fluid may move out of the affected area and may leave a dent for a few moments. CAUSES   Prolonged standing or sitting in one place for extended periods of time. Movement helps pump tissue fluid into the veins, and absence of movement prevents this, resulting in edema.  Varicose veins. The valves in the veins do not work as well as they should. This causes fluid to leak into the tissues.  Fluid and salt overload.  Injury, burn, or surgery to the leg, ankle, or foot, may damage veins and allow fluid to leak out.  Sunburn damages vessels. Leaky vessels allow fluid to go out into the sunburned tissues.  Allergies (from insect bites or stings, medications or chemicals) cause swelling by allowing vessels to become leaky.  Protein in the blood helps keep fluid in your vessels. Low protein, as in malnutrition, allows fluid to leak out.  Hormonal changes, including pregnancy and menstruation, cause fluid retention. This fluid may leak out of vessels and cause edema.  Medications that cause fluid retention. Examples are sex hormones, blood pressure medications, steroid treatment, or anti-depressants.  Some illnesses cause edema, especially heart failure, kidney disease, or liver disease.  Surgery that cuts veins or lymph nodes, such as surgery done for the heart or for breast cancer, may result in edema. DIAGNOSIS  Your caregiver is usually easily able to determine what is causing your swelling (edema) by simply asking what is wrong (getting a history) and examining you (doing a physical). Sometimes x-rays, EKG (electrocardiogram or heart tracing), and blood work may be done to evaluate for underlying medical  illness. TREATMENT  General treatment includes:  Leg elevation (or elevation of the affected body part).  Restriction of fluid intake.  Prevention of fluid overload.  Compression of the affected body part. Compression with elastic bandages or support stockings squeezes the tissues, preventing fluid from entering and forcing it back into the blood vessels.  Diuretics (also called water pills or fluid pills) pull fluid out of your body in the form of increased urination. These are effective in reducing the swelling, but can have side effects and must be used only under your caregiver's supervision. Diuretics are appropriate only for some types of edema. The specific treatment can be directed at any underlying causes discovered. Heart, liver, or kidney disease should be treated appropriately. HOME CARE INSTRUCTIONS   Elevate the legs (or affected body part) above the level of the heart, while lying down.  Avoid sitting or standing still for prolonged periods of time.  Avoid putting anything directly under the knees when lying down, and do not wear constricting clothing or garters on the upper legs.  Exercising the legs causes the fluid to work back into the veins and lymphatic channels. This may help the swelling go down.  The pressure applied by elastic bandages or support stockings can help reduce ankle  swelling.  A low-salt diet may help reduce fluid retention and decrease the ankle swelling.  Take any medications exactly as prescribed. SEEK MEDICAL CARE IF:  Your edema is not responding to recommended treatments. SEEK IMMEDIATE MEDICAL CARE IF:   You develop shortness of breath or chest pain.  You cannot breathe when you lay down; or if, while lying down, you have to get up and go to the window to get your breath.  You are having increasing swelling without relief from treatment.  You develop a fever over 102 F (38.9 C).  You develop pain or redness in the areas that are  swollen.  Tell your caregiver right away if you have gained 03 lb/1.4 kg in 1 day or 05 lb/2.3 kg in a week. MAKE SURE YOU:   Understand these instructions.  Will watch your condition.  Will get help right away if you are not doing well or get worse. Document Released: 02/07/2005 Document Revised: 08/09/2011 Document Reviewed: 09/26/2007 [facility] Patient Information 2014 [place].

## 2013-08-01 NOTE — Telephone Encounter (Signed)
Appt has been made for pt next week in CHF. Pt aware of appt.

## 2013-08-02 DIAGNOSIS — I509 Heart failure, unspecified: Secondary | ICD-10-CM | POA: Diagnosis not present

## 2013-08-02 DIAGNOSIS — I10 Essential (primary) hypertension: Secondary | ICD-10-CM | POA: Diagnosis not present

## 2013-08-02 DIAGNOSIS — Z794 Long term (current) use of insulin: Secondary | ICD-10-CM | POA: Diagnosis not present

## 2013-08-02 DIAGNOSIS — I5033 Acute on chronic diastolic (congestive) heart failure: Secondary | ICD-10-CM | POA: Diagnosis not present

## 2013-08-02 DIAGNOSIS — G4730 Sleep apnea, unspecified: Secondary | ICD-10-CM | POA: Diagnosis not present

## 2013-08-02 DIAGNOSIS — IMO0001 Reserved for inherently not codable concepts without codable children: Secondary | ICD-10-CM | POA: Diagnosis not present

## 2013-08-05 DIAGNOSIS — G4730 Sleep apnea, unspecified: Secondary | ICD-10-CM | POA: Diagnosis not present

## 2013-08-05 DIAGNOSIS — I10 Essential (primary) hypertension: Secondary | ICD-10-CM | POA: Diagnosis not present

## 2013-08-05 DIAGNOSIS — IMO0001 Reserved for inherently not codable concepts without codable children: Secondary | ICD-10-CM | POA: Diagnosis not present

## 2013-08-05 DIAGNOSIS — Z794 Long term (current) use of insulin: Secondary | ICD-10-CM | POA: Diagnosis not present

## 2013-08-05 DIAGNOSIS — I509 Heart failure, unspecified: Secondary | ICD-10-CM | POA: Diagnosis not present

## 2013-08-05 DIAGNOSIS — I5033 Acute on chronic diastolic (congestive) heart failure: Secondary | ICD-10-CM | POA: Diagnosis not present

## 2013-08-06 DIAGNOSIS — I509 Heart failure, unspecified: Secondary | ICD-10-CM | POA: Diagnosis not present

## 2013-08-06 DIAGNOSIS — Z794 Long term (current) use of insulin: Secondary | ICD-10-CM | POA: Diagnosis not present

## 2013-08-06 DIAGNOSIS — I5033 Acute on chronic diastolic (congestive) heart failure: Secondary | ICD-10-CM | POA: Diagnosis not present

## 2013-08-06 DIAGNOSIS — G4730 Sleep apnea, unspecified: Secondary | ICD-10-CM | POA: Diagnosis not present

## 2013-08-06 DIAGNOSIS — I10 Essential (primary) hypertension: Secondary | ICD-10-CM | POA: Diagnosis not present

## 2013-08-06 DIAGNOSIS — IMO0001 Reserved for inherently not codable concepts without codable children: Secondary | ICD-10-CM | POA: Diagnosis not present

## 2013-08-06 NOTE — Progress Notes (Addendum)
Patient ID: Melissa Myers, female   DOB: 1945-08-12, 67 y.o.   MRN: [id number]   PCP: Dr. Asa Lente Endocrinologist: Dr Loanne Drilling  68 yo with history of HTN, DM, hyperlipidemia, OHS/OSA, and chronic dyspnea/diastolic CHF.  Patient had an echo in 04/2009 showing moderate LVH and preserved LV systolic function. However, there was a very large LV mid-cavity gradient with valsalva.  Patient developed quite significant exertional dyspnea to the point where she was short of breath walking around her house. She had a pulmonary evaluation with Dr. Elsworth Soho but no primary lung problems were identified. Patient had an ETT-myoview in 05/2009 which was negative for ischemia or infarction. Right heart cath in 05/2009 showed mildly elevated right heart filling pressures but normal PA pressure and normal PCWP. She was started her on a beta blocker (Coreg) to try to lower her LV mid-cavity gradient (that likely occurs with exertion) and to better control BP.  V/Q scan was negative for PE.  PFTs from 06/2011 showed a restrictive defect. Last echo in 06/2013 showed EF 60-65% with normal RV size and systolic function.      Follow up for Heart Failure: After last visit diuretics held d/t rise in BUN/Creatinine however weight started to increase. Appears patient was not weighing herself daily. Went to ED 6/11 for CP and was given IV lasix and nitro which she felt better. Presents today in wheelchair. Reports being SOB. +orthopnea and LE edema. Weight is up 23 lbs since last visit. After asking multiple times in different ways patient finally reported she has missed some medications d/t forgetting. Big issue is also all over pain. Does not wear CPAP and wears chronic O2 which looked off when she arrived. Reports following low salt diet and drinking less than 2L a day.    Labs (4/11): TGs 539, HDL 58, LDL 128, K 5.2, creatinine 1.3, TSH normal, BNP 14  Labs (5/11): K 3.9, creatinine 1.0  Labs (9/11): creatinine 1.1, BUN 43, LDL  145, HDL 60  Labs (10/11): K 4.3, creatinine 0.81, HCT 34, LDL 115, HDL 75  Labs (11/12): K 4.2, creatinine 1.58 => 3.0, HCT 32.2, BNP 172, LDL 97, TGs 980 Labs (12/12): K 3.4, creatinine 1.2 => 1.7 Labs (1/13): K 3.9, creatinine 1.1, TGs 281, LDL 121 Labs (2/13): K 3.3, creatinine 1.2 Labs (10/13): K 4.1, creatinine 0.85 Labs (4/14): K 3.7, creatinine 1.2, BUN 43, BNP 23 Labs (5/14): K 4.1, creatinine 1.0 Labs (7/14): K 4.6 Creatinine 1.0 BNP 39.0  Labs (8/14): K 3.5, creatinine 0.91, BNP 71 Labs (11/14): K 3.7, creatinine 0.9 Labs (12/14): K 4.1, creatinine 0.8, BNP 52 Labs (3/15): K 4, creatinine 0.9 Labs (4/15): K 3.7, creatinine 0.9 Labs (5/15): K 3.8, creatinine 0.9, BNP 29 Labs (6/15): K 3.4, BUN 105, creatinine 0.9 =>1.7 Labs (6/15) K 4.2, BUN 41, Creatinine 0.80  Allergies (verified):  1) ! Sulfa  2) Morphine   Past Medical History:  1. Diabetes mellitus, type II  2. Hyperlipidemia: She has been unable to tolerate statins (has been on Vytorin, lovastatin, and Crestor) due to what sounds like rhabdo: developed muscle weakness and was told she had "muscle damage" with each of these statins. She has been told not to take statins anymore.  3. Hypertension 4. Obesity  5. Restless Leg Syndrome  6. OA  7. peripheral neuropathy  8. OHS/OSA: Uses supplemental oxygen, does not tolerate CPAP.   9. Chronic nausea/diarrhea: ? IBS  10. Diastolic CHF: Echo (8/41) with normal LV size, moderate LVH,  EF 65-70%, LV mid-cavity gradient reaching 63 mmHg with valsalva but minimal at rest, grade I diastolic dysfunction, cannot estimate PA systolic pressure (no TR doppler signal), RV normal. RHC (4/11): Mean RA 11, RV 37/13, PA 33/15, mean PCWP 12, CI 3.3. Echo in 11/12 showed mild LVH, EF 65-70%, no LV mid-cavity gradient was mentioned.  Echo in 4/14 showed EF 65-70%, mild LVH, grade I diastolic dysfunction, PA systolic pressure 35 mmHg, mild LV mid-cavity gradient.  Echo (5/15) with EF 60-65%,  mild TR, normal RV size and systolic function.  11. ETT-myoview (4/11): 5'30", stopped due to fatigue, normal EF, no evidence for scar or ischemia. 12. V/Q scan 11/12: negative for PE.  Lower extremity venous doppler US (3/14): negative for DVT.  13. PFTs (5/13): FVC 50%, FEV1 62%, ratio 106%, DLCO 69%, TLC 69%.   Family History:  Family History of Alcoholism/Addiction (parent)  Family History Diabetes 1st degree relative (grandparent)  Family History High cholesterol (parent, grandparent)  Family History Hypertension (parent, grandparent)  Family History Lung cancer (grandparent)  Stomach cancer (grandmother)  Celiac Sprue daughter  Mother with MI at 85, father with MI at 72, uncle with MI at 18, brother with stents in his 44s, multiple aunts with MIs and CVAs   Social History:  Never Smoked  no alcohol  married, lives with spouse and her mother  retired Network engineer - now housewife  Alcohol Use - no  Illicit Drug Use - no  Review of Systems  All systems reviewed and negative except as per HPI.   ROS: All systems reviewed and negative except as per HPI.   Current Outpatient Prescriptions  Medication Sig Dispense Refill  . aspirin 81 MG tablet Take 81 mg by mouth daily.       . carvedilol (COREG) 25 MG tablet Take 1 tablet (25 mg total) by mouth 2 (two) times daily with a meal.  60 tablet  6  . cholecalciferol (VITAMIN D) 1000 UNITS tablet Take 1,000 Units by mouth daily.      . Coenzyme Q10 (COQ10) 30 MG CAPS Take 30 mg by mouth daily.       . fenofibrate 160 MG tablet Take 160 mg by mouth every morning.      . hydrALAZINE (APRESOLINE) 25 MG tablet Take 1 tablet (25 mg total) by mouth 3 (three) times daily.  90 tablet  6  . insulin glargine (LANTUS) 100 UNIT/ML injection Inject 60 Units into the skin at bedtime.      . insulin glulisine (APIDRA) 100 UNIT/ML injection Inject 60-70 Units into the skin See admin instructions. Take 60 units before breakfast and lunch, and 70 units  before dinner.      . Magnesium 200 MG TABS Take 200 mg by mouth daily.   60 each    . metolazone (ZAROXOLYN) 2.5 MG tablet Take 2.5 mg by mouth 3 (three) times a week. Take on Tuesday, Thursday and Saturday.      [name] omega-3 acid ethyl esters (LOVAZA) 1 G capsule Take 2 g by mouth 2 (two) times daily.      [name] oxyCODONE-acetaminophen (PERCOCET/ROXICET) 5-325 MG per tablet Take 1 tablet by mouth every 6 (six) hours as needed for moderate pain or severe pain.      . potassium chloride (K-DUR) 10 MEQ tablet 4 tablets (40 mEq)three times a day on days you take metolazone, 4 tablets (40 mEq)two times a day on non-metolazone days      . pregabalin (LYRICA) 50 MG capsule  Take 1 capsule (50 mg total) by mouth 3 (three) times daily.  90 capsule  5  . rOPINIRole (REQUIP) 2 MG tablet Take 2 mg by mouth 2 (two) times daily.      [name] torsemide (DEMADEX) 20 MG tablet Take 80 mg by mouth 2 (two) times daily.       No current facility-administered medications for this encounter.   Filed Vitals:   08/07/13 1054  BP: 112/49  Pulse: 70  Resp: 22  Weight: 286 lb 4 oz (129.842 kg)  SpO2: 98%    General: NAD, obese  Neck: Thick, JVP difficult to assess d/t body habitus but appears 10, no thyromegaly or thyroid nodule.  Lungs: Clear to auscultation bilaterally with normal respiratory effort. CV: Nondisplaced PMI.  Heart regular S1/S2, no S3/S4, 2/6 SEM RUSB.  2+ bilateral woody edema with chronic erythema.  No carotid bruit.  Abdomen: Soft, nontender, no hepatosplenomegaly, mildly distended. Obese Neurologic: Alert and oriented x 3.  Psych: Normal affect. Extremities: No clubbing/cyanosis    Assessment/Plan:  1. Acute/Chronic diastolic CHF:  NYHA class IIIb/IV symptoms and volume status elevated. Weight is up 23 lbs likely related to not taking her medications as prescribed. She reports she sometimes forgets to take medications. Had lengthy conversation with son, husband, patient and daughter in law Warehouse manager)  about the concern that if Mrs. Hoar can't take medications as prescribed then she may need to be placed in SNF. She is adamant she does not want to do this but stressed the concern to need to comply with dietary and fluid restrictions along with taking medications. We also talked in depth about the need to wear CPAP at night. She is open to calling pulm and trying nasal pillows for CPAP. It also appears that her oxygen was not on when she arrived and asked her to make sure she is getting O2 continuously. [facility] came to clinic to check O2 tank and switch out. - Gave 80 mg IV lasix, 5 mg metolazone and 60 meq of potassium in clinic. She had 1800 cc out and weight down 3 lbs.  - Will have her take 5 mg of metolazone Thursday and Friday with extra 40 meq of potassium and then go back to home dose of metolazone 2.5 mg on Tues, Thurs and Saturday. Continue torsemide 80 mg BID. - Reinforced the need and importance of daily weights, a low sodium diet, and fluid restriction (less than 2 L a day). Instructed to call the HF clinic if weight increases more than 3 lbs overnight or 5 lbs in a week.  2. Pulmonary: Suspect OHS/OSA.   - As above have asked her to follow up with Dr. Elsworth Soho about CPAP nasal pillows.  - Continue home oxygen continuous. 3. Obesity - Have reinforced the need to cut back on calories and to be as active as possible    F/U 1 week with BMET Junie Bame B 08/07/2013

## 2013-08-07 ENCOUNTER — Telehealth: Payer: Self-pay | Admitting: Pulmonary Disease

## 2013-08-07 ENCOUNTER — Encounter (HOSPITAL_COMMUNITY): Payer: Self-pay

## 2013-08-07 ENCOUNTER — Ambulatory Visit (HOSPITAL_COMMUNITY)
Admission: RE | Admit: 2013-08-07 | Discharge: 2013-08-07 | Disposition: A | Discharge status: Home / Self Care | Payer: Medicare Other | Source: Ambulatory Visit | Attending: Internal Medicine | Admitting: Internal Medicine

## 2013-08-07 VITALS — BP 112/49 | HR 70 | Resp 22 | Wt 286.2 lb

## 2013-08-07 DIAGNOSIS — Z9981 Dependence on supplemental oxygen: Secondary | ICD-10-CM | POA: Insufficient documentation

## 2013-08-07 DIAGNOSIS — Z79899 Other long term (current) drug therapy: Secondary | ICD-10-CM | POA: Insufficient documentation

## 2013-08-07 DIAGNOSIS — E669 Obesity, unspecified: Secondary | ICD-10-CM

## 2013-08-07 DIAGNOSIS — E785 Hyperlipidemia, unspecified: Secondary | ICD-10-CM | POA: Insufficient documentation

## 2013-08-07 DIAGNOSIS — M1990 Unspecified osteoarthritis, unspecified site: Secondary | ICD-10-CM | POA: Insufficient documentation

## 2013-08-07 DIAGNOSIS — G4730 Sleep apnea, unspecified: Secondary | ICD-10-CM

## 2013-08-07 DIAGNOSIS — Z7982 Long term (current) use of aspirin: Secondary | ICD-10-CM | POA: Diagnosis not present

## 2013-08-07 DIAGNOSIS — I5033 Acute on chronic diastolic (congestive) heart failure: Secondary | ICD-10-CM | POA: Diagnosis not present

## 2013-08-07 DIAGNOSIS — R0989 Other specified symptoms and signs involving the circulatory and respiratory systems: Secondary | ICD-10-CM | POA: Insufficient documentation

## 2013-08-07 DIAGNOSIS — I50812 Chronic right heart failure: Secondary | ICD-10-CM

## 2013-08-07 DIAGNOSIS — R52 Pain, unspecified: Secondary | ICD-10-CM | POA: Insufficient documentation

## 2013-08-07 DIAGNOSIS — R0609 Other forms of dyspnea: Secondary | ICD-10-CM | POA: Diagnosis not present

## 2013-08-07 DIAGNOSIS — I509 Heart failure, unspecified: Secondary | ICD-10-CM

## 2013-08-07 DIAGNOSIS — E119 Type 2 diabetes mellitus without complications: Secondary | ICD-10-CM | POA: Diagnosis not present

## 2013-08-07 DIAGNOSIS — I10 Essential (primary) hypertension: Secondary | ICD-10-CM | POA: Insufficient documentation

## 2013-08-07 DIAGNOSIS — Z794 Long term (current) use of insulin: Secondary | ICD-10-CM | POA: Insufficient documentation

## 2013-08-07 LAB — BASIC METABOLIC PANEL
BUN: 41 mg/dL — ABNORMAL HIGH (ref 6–23)
CALCIUM: 10.1 mg/dL (ref 8.4–10.5)
CO2: 30 mEq/L (ref 19–32)
Chloride: 89 mEq/L — ABNORMAL LOW (ref 96–112)
Creatinine, Ser: 0.8 mg/dL (ref 0.50–1.10)
GFR calc Af Amer: 86 mL/min — ABNORMAL LOW (ref >90)
GFR, EST NON AFRICAN AMERICAN: 74 mL/min — AB (ref >90)
Glucose, Bld: 129 mg/dL — ABNORMAL HIGH (ref 70–99)
Potassium: 4.2 mEq/L (ref 3.7–5.3)
SODIUM: 140 meq/L (ref 137–147)

## 2013-08-07 LAB — PRO B NATRIURETIC PEPTIDE: PRO B NATRI PEPTIDE: 105.8 pg/mL (ref 0–125)

## 2013-08-07 MED ORDER — FUROSEMIDE 10 MG/ML IJ SOLN
80.0000 mg | Freq: Once | INTRAMUSCULAR | Status: AC
Start: 2013-08-07 — End: 2013-08-07
  Administered 2013-08-07: 80 mg via INTRAVENOUS
  Filled 2013-08-07: qty 8

## 2013-08-07 MED ORDER — FUROSEMIDE 10 MG/ML IJ SOLN: 80.0000 mg | Freq: Once | INTRAMUSCULAR | Status: DC

## 2013-08-07 MED ORDER — POTASSIUM CHLORIDE CRYS ER 20 MEQ PO TBCR
60.0000 meq | EXTENDED_RELEASE_TABLET | Freq: Once | ORAL | Status: AC
Start: 2013-08-07 — End: 2013-08-07
  Administered 2013-08-07: 60 meq via ORAL
  Filled 2013-08-07: qty 3

## 2013-08-07 MED ORDER — POTASSIUM CHLORIDE CRYS ER 20 MEQ PO TBCR: 60.0000 meq | EXTENDED_RELEASE_TABLET | Freq: Once | ORAL | Status: DC

## 2013-08-07 NOTE — Telephone Encounter (Signed)
Pt reports she has never tried nasal pillows. She did turn CPAP in and no longer has this. We will need to send new order to DME w/ settings, etc. Please advise Dr. Elsworth Soho thanks

## 2013-08-07 NOTE — Telephone Encounter (Signed)
Per OV 05/06/13; OBSTRUCTIVE SLEEP APNEA - Rigoberto Noel, MD at 05/06/2013  8:41 PM      Status: Written Related Problem: OBSTRUCTIVE SLEEP APNEA    CPAP intolerant inspite of trying various settings OK to dc cpap Stay on 24h O2     --  Called spoke with pt. She is still using O2 2 liters 24/7. She was told about using nasal pillows with CPAP and was told this was successful in some patients. She wants Dr. Bari Mantis opinion. Please advise thanks

## 2013-08-07 NOTE — Progress Notes (Signed)
Patient volume overloaded during scheduled [facility] visit, 22g PIV started in R hand, 80 iv lasix administered and flushed.  60 meq potassium and 5mg  metolazone PO also given.  Patient tolerated well.  Resting in clinic with call bell in reach and family at bedside.  Will continue to monitor closely.  Beginning weight: 286lb 4oz Total urinary output:1800cc Ending weight:283lb 6oz  PIV removed before patient left clinic visit Renee Pain

## 2013-08-07 NOTE — Telephone Encounter (Signed)
OK to try if she has never tried this

## 2013-08-07 NOTE — Patient Instructions (Signed)
Take 5 mg of metolazone tomorrow and Friday and then go back to 2.5 mg on Tuesday, Thursday and Saturday.  Take an extra 40 meq of potassium on Thursday and Friday and then go back to 40 meq three times a day on Tuesday, Thursday and Saturday. On days you don't take metolazone take potassium 40 meq twice a day.  Follow up in 1 week  Follow up with pain management clinic  Follow up with Dr. Elsworth Soho about CPAP  Do the following things EVERYDAY: 1) Weigh yourself in the morning before breakfast. Write it down and keep it in a log. 2) Take your medicines as prescribed 3) Eat low salt foods-Limit salt (sodium) to 2000 mg per day.  4) Stay as active as you can everyday 5) Limit all fluids for the day to less than 2 liters 6)

## 2013-08-08 ENCOUNTER — Other Ambulatory Visit: Payer: Self-pay | Admitting: Internal Medicine

## 2013-08-08 ENCOUNTER — Other Ambulatory Visit (HOSPITAL_COMMUNITY): Payer: Self-pay | Admitting: Cardiology

## 2013-08-08 DIAGNOSIS — IMO0001 Reserved for inherently not codable concepts without codable children: Secondary | ICD-10-CM | POA: Diagnosis not present

## 2013-08-08 DIAGNOSIS — I5033 Acute on chronic diastolic (congestive) heart failure: Secondary | ICD-10-CM | POA: Diagnosis not present

## 2013-08-08 DIAGNOSIS — I10 Essential (primary) hypertension: Secondary | ICD-10-CM | POA: Diagnosis not present

## 2013-08-08 DIAGNOSIS — Z794 Long term (current) use of insulin: Secondary | ICD-10-CM | POA: Diagnosis not present

## 2013-08-08 DIAGNOSIS — I509 Heart failure, unspecified: Secondary | ICD-10-CM | POA: Diagnosis not present

## 2013-08-08 DIAGNOSIS — G4730 Sleep apnea, unspecified: Secondary | ICD-10-CM | POA: Diagnosis not present

## 2013-08-09 DIAGNOSIS — I5033 Acute on chronic diastolic (congestive) heart failure: Secondary | ICD-10-CM | POA: Diagnosis not present

## 2013-08-09 DIAGNOSIS — G4730 Sleep apnea, unspecified: Secondary | ICD-10-CM | POA: Diagnosis not present

## 2013-08-09 DIAGNOSIS — Z794 Long term (current) use of insulin: Secondary | ICD-10-CM | POA: Diagnosis not present

## 2013-08-09 DIAGNOSIS — I509 Heart failure, unspecified: Secondary | ICD-10-CM | POA: Diagnosis not present

## 2013-08-09 DIAGNOSIS — I10 Essential (primary) hypertension: Secondary | ICD-10-CM | POA: Diagnosis not present

## 2013-08-09 DIAGNOSIS — IMO0001 Reserved for inherently not codable concepts without codable children: Secondary | ICD-10-CM | POA: Diagnosis not present

## 2013-08-12 DIAGNOSIS — I5033 Acute on chronic diastolic (congestive) heart failure: Secondary | ICD-10-CM | POA: Diagnosis not present

## 2013-08-12 DIAGNOSIS — G4730 Sleep apnea, unspecified: Secondary | ICD-10-CM | POA: Diagnosis not present

## 2013-08-12 DIAGNOSIS — IMO0001 Reserved for inherently not codable concepts without codable children: Secondary | ICD-10-CM | POA: Diagnosis not present

## 2013-08-12 DIAGNOSIS — Z794 Long term (current) use of insulin: Secondary | ICD-10-CM | POA: Diagnosis not present

## 2013-08-12 DIAGNOSIS — I10 Essential (primary) hypertension: Secondary | ICD-10-CM | POA: Diagnosis not present

## 2013-08-12 DIAGNOSIS — I509 Heart failure, unspecified: Secondary | ICD-10-CM | POA: Diagnosis not present

## 2013-08-12 NOTE — Telephone Encounter (Signed)
Pl let pt know  - that since she did not tolerate , it would be difficult to get this back now OK to stay off & observe If she gains wt, her symptoms may get worse

## 2013-08-12 NOTE — Telephone Encounter (Signed)
Pt is aware of recs. Nothing further needed 

## 2013-08-13 ENCOUNTER — Other Ambulatory Visit: Payer: Self-pay | Admitting: Cardiology

## 2013-08-13 DIAGNOSIS — I509 Heart failure, unspecified: Secondary | ICD-10-CM | POA: Diagnosis not present

## 2013-08-13 DIAGNOSIS — Z794 Long term (current) use of insulin: Secondary | ICD-10-CM | POA: Diagnosis not present

## 2013-08-13 DIAGNOSIS — G4730 Sleep apnea, unspecified: Secondary | ICD-10-CM | POA: Diagnosis not present

## 2013-08-13 DIAGNOSIS — IMO0001 Reserved for inherently not codable concepts without codable children: Secondary | ICD-10-CM | POA: Diagnosis not present

## 2013-08-13 DIAGNOSIS — I10 Essential (primary) hypertension: Secondary | ICD-10-CM | POA: Diagnosis not present

## 2013-08-13 DIAGNOSIS — I5033 Acute on chronic diastolic (congestive) heart failure: Secondary | ICD-10-CM | POA: Diagnosis not present

## 2013-08-13 NOTE — Progress Notes (Addendum)
Patient ID: Melissa Myers, female   DOB: [birth year], 68 y.o.   MRN: [id number]   PCP: Dr. Asa Lente Endocrinologist: Dr Loanne Drilling  68 yo with history of HTN, DM, hyperlipidemia, OHS/OSA, and chronic dyspnea/diastolic CHF.  Patient had an echo in 04/2009 showing moderate LVH and preserved LV systolic function. However, there was a very large LV mid-cavity gradient with valsalva.  Patient developed quite significant exertional dyspnea to the point where she was short of breath walking around her house. She had a pulmonary evaluation with Dr. Elsworth Soho but no primary lung problems were identified. Patient had an ETT-myoview in 05/2009 which was negative for ischemia or infarction. Right heart cath in 05/2009 showed mildly elevated right heart filling pressures but normal PA pressure and normal PCWP. She was started her on a beta blocker (Coreg) to try to lower her LV mid-cavity gradient (that likely occurs with exertion) and to better control BP.  V/Q scan was negative for PE.  PFTs from 06/2011 showed a restrictive defect. Last echo in 06/2013 showed EF 60-65% with normal RV size and systolic function.      She returns for follow up. Last week she received IV lasix due to volume overload as well as instructed to take metolazone on Thursday and Friday. Volume overload was thought to be from medication noncompliance. Weight went down to 278 to 280 pounds. Complaining of pain with urination. Son managing medications but yesterday Melissa Myers decided not to take metolazone. Remains SOB with exertion. Intolerant CPAP but would like to consider again. Following low salt diet and limiting fluid intake to < 2 liters per day.    Labs (4/11): TGs 539, HDL 58, LDL 128, K 5.2, creatinine 1.3, TSH normal, BNP 14  Labs (5/11): K 3.9, creatinine 1.0  Labs (9/11): creatinine 1.1, BUN 43, LDL 145, HDL 60  Labs (10/11): K 4.3, creatinine 0.81, HCT 34, LDL 115, HDL 75  Labs (11/12): K 4.2, creatinine 1.58 => 3.0, HCT 32.2, BNP  172, LDL 97, TGs 980 Labs (12/12): K 3.4, creatinine 1.2 => 1.7 Labs (1/13): K 3.9, creatinine 1.1, TGs 281, LDL 121 Labs (2/13): K 3.3, creatinine 1.2 Labs (10/13): K 4.1, creatinine 0.85 Labs (4/14): K 3.7, creatinine 1.2, BUN 43, BNP 23 Labs (5/14): K 4.1, creatinine 1.0 Labs (7/14): K 4.6 Creatinine 1.0 BNP 39.0  Labs (8/14): K 3.5, creatinine 0.91, BNP 71 Labs (11/14): K 3.7, creatinine 0.9 Labs (12/14): K 4.1, creatinine 0.8, BNP 52 Labs (3/15): K 4, creatinine 0.9 Labs (4/15): K 3.7, creatinine 0.9 Labs (5/15): K 3.8, creatinine 0.9, BNP 29 Labs (6/15): K 3.4, BUN 105, creatinine 0.9 =>1.7 Labs (6/17/5) K 4.2, BUN 41, Creatinine 0.80  Allergies (verified):  1) ! Sulfa  2) Morphine   Past Medical History:  1. Diabetes mellitus, type II  2. Hyperlipidemia: She has been unable to tolerate statins (has been on Vytorin, lovastatin, and Crestor) due to what sounds like rhabdo: developed muscle weakness and was told she had "muscle damage" with each of these statins. She has been told not to take statins anymore.  3. Hypertension 4. Obesity  5. Restless Leg Syndrome  6. OA  7. peripheral neuropathy  8. OHS/OSA: Uses supplemental oxygen, does not tolerate CPAP.   9. Chronic nausea/diarrhea: ? IBS  10. Diastolic CHF: Echo (1/66) with normal LV size, moderate LVH, EF 65-70%, LV mid-cavity gradient reaching 63 mmHg with valsalva but minimal at rest, grade I diastolic dysfunction, cannot estimate PA systolic pressure (no TR doppler  signal), RV normal. RHC (4/11): Mean RA 11, RV 37/13, PA 33/15, mean PCWP 12, CI 3.3. Echo in 11/12 showed mild LVH, EF 65-70%, no LV mid-cavity gradient was mentioned.  Echo in 4/14 showed EF 65-70%, mild LVH, grade I diastolic dysfunction, PA systolic pressure 35 mmHg, mild LV mid-cavity gradient.  Echo (5/15) with EF 60-65%, mild TR, normal RV size and systolic function.  11. ETT-myoview (4/11): 5'30", stopped due to fatigue, normal EF, no evidence for scar  or ischemia. 12. V/Q scan 11/12: negative for PE.  Lower extremity venous doppler US (3/14): negative for DVT.  13. PFTs (5/13): FVC 50%, FEV1 62%, ratio 106%, DLCO 69%, TLC 69%.   Family History:  Family History of Alcoholism/Addiction (parent)  Family History Diabetes 1st degree relative (grandparent)  Family History High cholesterol (parent, grandparent)  Family History Hypertension (parent, grandparent)  Family History Lung cancer (grandparent)  Stomach cancer (grandmother)  Celiac Sprue daughter  Mother with MI at 29, father with MI at 53, uncle with MI at 1, brother with stents in his 44s, multiple aunts with MIs and CVAs   Social History:  Never Smoked  no alcohol  married, lives with spouse and her mother  retired Network engineer - now housewife  Alcohol Use - no  Illicit Drug Use - no  Review of Systems  All systems reviewed and negative except as per HPI.   ROS: All systems reviewed and negative except as per HPI.   Current Outpatient Prescriptions  Medication Sig Dispense Refill  . aspirin 81 MG tablet Take 81 mg by mouth daily.       . carvedilol (COREG) 25 MG tablet Take 1 tablet (25 mg total) by mouth 2 (two) times daily with a meal.  60 tablet  6  . cholecalciferol (VITAMIN D) 1000 UNITS tablet Take 1,000 Units by mouth daily.      . Coenzyme Q10 (COQ10) 30 MG CAPS Take 30 mg by mouth daily.       . DULoxetine (CYMBALTA) 20 MG capsule TAKE 1 CAPSULE BY MOUTH EVERY DAY  30 capsule  5  . fenofibrate 160 MG tablet Take 160 mg by mouth every morning.      . hydrALAZINE (APRESOLINE) 25 MG tablet Take 1 tablet (25 mg total) by mouth 3 (three) times daily.  90 tablet  6  . insulin glargine (LANTUS) 100 UNIT/ML injection Inject 60 Units into the skin at bedtime.      . insulin glulisine (APIDRA) 100 UNIT/ML injection Inject 60-70 Units into the skin See admin instructions. Take 60 units before breakfast and lunch, and 70 units before dinner.      . Magnesium 200 MG TABS Take  200 mg by mouth daily.   60 each    . metolazone (ZAROXOLYN) 2.5 MG tablet Take 2.5 mg by mouth 3 (three) times a week. Take on Tuesday, Thursday and Saturday.      [name] omega-3 acid ethyl esters (LOVAZA) 1 G capsule Take 2 g by mouth 2 (two) times daily.      [name] oxyCODONE-acetaminophen (PERCOCET/ROXICET) 5-325 MG per tablet Take 1 tablet by mouth every 6 (six) hours as needed for moderate pain or severe pain.      . potassium chloride (K-DUR) 10 MEQ tablet 4 tablets (40 mEq)three times a day on days you take metolazone, 4 tablets (40 mEq)two times a day on non-metolazone days      . pregabalin (LYRICA) 50 MG capsule Take 1 capsule (50 mg total) by  mouth 3 (three) times daily.  90 capsule  5  . rOPINIRole (REQUIP) 2 MG tablet Take 2 mg by mouth 2 (two) times daily.      [name] torsemide (DEMADEX) 20 MG tablet TAKE 4 TABLETS BY MOUTH TWICE DAILY.  240 tablet  0   No current facility-administered medications for this encounter.   Filed Vitals:   08/14/13 1040  BP: 104/62  Pulse: 64  Resp: 20  Weight: 284 lb 2 oz (128.878 kg)  SpO2: 90%    General: NAD, obese Son, husband, daughter in law present  Neck: Thick, JVP difficult to assess d/t body habitus but appears 9-10, no thyromegaly or thyroid nodule.  Lungs: Clear to auscultation bilaterally with normal respiratory effort. CV: Nondisplaced PMI.  Heart regular S1/S2, no S3/S4, 2/6 SEM RUSB.  2+ bilateral woody edema with chronic erythema.  No carotid bruit.  Abdomen: Soft, nontender, no hepatosplenomegaly, ++ distended. Obese Neurologic: Alert and oriented x 3.  Psych: Normal affect. Extremities: No clubbing/cyanosis    Assessment/Plan:  1. Acute/Chronic diastolic CHF:  NYHA class IIIb/IV symptoms and volume status slightly better but she missed yesterdays metolazone.  - Instructed to take 5 mg of metolazone today and go back to home dose of metolazone 2.5 mg on Tues, Thurs and Saturday. Continue torsemide 80 mg BID. Her son is going to manage  all her medications to ensure compliance. This seems to have helped with her compliance.  - Reinforced the need and importance of daily weights, a low sodium diet, and fluid restriction (less than 2 L a day). Instructed to call the HF clinic if weight increases more than 3 lbs overnight or 5 lbs in a week.  Check BMET today.  2. Pulmonary: Suspect OHS/OSA.   - As above have asked her to follow up with Dr. Elsworth Soho about CPAP nasal pillows.  - Continue home oxygen continuous. 3. Obesity - Have reinforced the need to cut back on calories and to be as active as possible  4. Dysuria--Check UA.[name] Allergic to sulfat. If UTI present will need cipro 500 mg twice a day for 5 days.   Follow up next week to check volume status.  CLEGG,AMY NP-C  08/14/2013

## 2013-08-14 ENCOUNTER — Ambulatory Visit (HOSPITAL_COMMUNITY)
Admission: RE | Admit: 2013-08-14 | Discharge: 2013-08-14 | Disposition: A | Discharge status: Home / Self Care | Payer: Medicare Other | Source: Ambulatory Visit | Attending: Internal Medicine | Admitting: Internal Medicine

## 2013-08-14 ENCOUNTER — Other Ambulatory Visit (HOSPITAL_COMMUNITY): Payer: Self-pay

## 2013-08-14 ENCOUNTER — Encounter (HOSPITAL_COMMUNITY): Payer: Self-pay

## 2013-08-14 VITALS — BP 104/62 | HR 64 | Resp 20 | Wt 284.1 lb

## 2013-08-14 DIAGNOSIS — R300 Dysuria: Secondary | ICD-10-CM

## 2013-08-14 DIAGNOSIS — I5033 Acute on chronic diastolic (congestive) heart failure: Secondary | ICD-10-CM | POA: Insufficient documentation

## 2013-08-14 DIAGNOSIS — M1990 Unspecified osteoarthritis, unspecified site: Secondary | ICD-10-CM | POA: Insufficient documentation

## 2013-08-14 DIAGNOSIS — G4733 Obstructive sleep apnea (adult) (pediatric): Secondary | ICD-10-CM | POA: Insufficient documentation

## 2013-08-14 DIAGNOSIS — I10 Essential (primary) hypertension: Secondary | ICD-10-CM | POA: Diagnosis not present

## 2013-08-14 DIAGNOSIS — G2581 Restless legs syndrome: Secondary | ICD-10-CM | POA: Diagnosis not present

## 2013-08-14 DIAGNOSIS — I509 Heart failure, unspecified: Secondary | ICD-10-CM | POA: Diagnosis not present

## 2013-08-14 DIAGNOSIS — Z7982 Long term (current) use of aspirin: Secondary | ICD-10-CM | POA: Diagnosis not present

## 2013-08-14 DIAGNOSIS — E669 Obesity, unspecified: Secondary | ICD-10-CM | POA: Diagnosis not present

## 2013-08-14 DIAGNOSIS — G4730 Sleep apnea, unspecified: Secondary | ICD-10-CM | POA: Diagnosis not present

## 2013-08-14 DIAGNOSIS — E785 Hyperlipidemia, unspecified: Secondary | ICD-10-CM | POA: Diagnosis not present

## 2013-08-14 DIAGNOSIS — I5032 Chronic diastolic (congestive) heart failure: Secondary | ICD-10-CM | POA: Diagnosis not present

## 2013-08-14 DIAGNOSIS — I50812 Chronic right heart failure: Secondary | ICD-10-CM

## 2013-08-14 DIAGNOSIS — E119 Type 2 diabetes mellitus without complications: Secondary | ICD-10-CM | POA: Diagnosis not present

## 2013-08-14 DIAGNOSIS — Z794 Long term (current) use of insulin: Secondary | ICD-10-CM | POA: Insufficient documentation

## 2013-08-14 DIAGNOSIS — Z9981 Dependence on supplemental oxygen: Secondary | ICD-10-CM | POA: Diagnosis not present

## 2013-08-14 LAB — URINALYSIS, ROUTINE W REFLEX MICROSCOPIC
BILIRUBIN URINE: NEGATIVE
Glucose, UA: NEGATIVE mg/dL
Hgb urine dipstick: NEGATIVE
Ketones, ur: NEGATIVE mg/dL
NITRITE: POSITIVE — AB
Protein, ur: NEGATIVE mg/dL
SPECIFIC GRAVITY, URINE: 1.018 (ref 1.005–1.030)
UROBILINOGEN UA: 0.2 mg/dL (ref 0.0–1.0)
pH: 5 (ref 5.0–8.0)

## 2013-08-14 LAB — BASIC METABOLIC PANEL
BUN: 53 mg/dL — AB (ref 6–23)
CHLORIDE: 93 meq/L — AB (ref 96–112)
CO2: 37 mEq/L — ABNORMAL HIGH (ref 19–32)
Calcium: 10 mg/dL (ref 8.4–10.5)
Creatinine, Ser: 0.84 mg/dL (ref 0.50–1.10)
GFR, EST AFRICAN AMERICAN: 81 mL/min — AB (ref >90)
GFR, EST NON AFRICAN AMERICAN: 70 mL/min — AB (ref >90)
Glucose, Bld: 134 mg/dL — ABNORMAL HIGH (ref 70–99)
POTASSIUM: 3.5 meq/L — AB (ref 3.7–5.3)
Sodium: 143 mEq/L (ref 137–147)

## 2013-08-14 LAB — URINE MICROSCOPIC-ADD ON

## 2013-08-14 MED ORDER — CIPROFLOXACIN HCL 500 MG PO TABS: 500.0000 mg | ORAL_TABLET | Freq: Two times a day (BID) | ORAL | Status: DC

## 2013-08-14 NOTE — Patient Instructions (Signed)
Follow up next week   Take an extra metolazone 2.5 mg today  Do the following things EVERYDAY: 1) Weigh yourself in the morning before breakfast. Write it down and keep it in a log. 2) Take your medicines as prescribed 3) Eat low salt foods-Limit salt (sodium) to 2000 mg per day.  4) Stay as active as you can everyday 5) Limit all fluids for the day to less than 2 liters

## 2013-08-15 ENCOUNTER — Telehealth (HOSPITAL_COMMUNITY): Payer: Self-pay | Admitting: Vascular Surgery

## 2013-08-15 ENCOUNTER — Other Ambulatory Visit: Payer: Self-pay | Admitting: Cardiology

## 2013-08-15 DIAGNOSIS — Z794 Long term (current) use of insulin: Secondary | ICD-10-CM | POA: Diagnosis not present

## 2013-08-15 DIAGNOSIS — I509 Heart failure, unspecified: Secondary | ICD-10-CM | POA: Diagnosis not present

## 2013-08-15 DIAGNOSIS — I10 Essential (primary) hypertension: Secondary | ICD-10-CM | POA: Diagnosis not present

## 2013-08-15 DIAGNOSIS — IMO0001 Reserved for inherently not codable concepts without codable children: Secondary | ICD-10-CM | POA: Diagnosis not present

## 2013-08-15 DIAGNOSIS — I5033 Acute on chronic diastolic (congestive) heart failure: Secondary | ICD-10-CM | POA: Diagnosis not present

## 2013-08-15 DIAGNOSIS — G4730 Sleep apnea, unspecified: Secondary | ICD-10-CM | POA: Diagnosis not present

## 2013-08-15 NOTE — Telephone Encounter (Signed)
Followed up on patient call, per patient she has gained 2 lbs since yesterday, but did not weigh same time as yesterday.  No swelling, edema, SOB noted, states she "feels fine other than my restless legs".  Also mentioned potassium tablets whole in stool.  Instructed to dissolve tablets in small amount of liquid and drink, which may also help with her restless legs.  Has apt with PCP on Monday, per Amy Clegg follow up with [place] after that appointment with updates. Renee Pain

## 2013-08-15 NOTE — Telephone Encounter (Signed)
Vanessa from advanced call : Pt weight is up yesterday 280.6 today 282.2. Please advise

## 2013-08-19 ENCOUNTER — Encounter: Payer: Self-pay | Admitting: Internal Medicine

## 2013-08-19 ENCOUNTER — Ambulatory Visit (INDEPENDENT_AMBULATORY_CARE_PROVIDER_SITE_OTHER): Payer: Medicare Other | Admitting: Internal Medicine

## 2013-08-19 ENCOUNTER — Other Ambulatory Visit (INDEPENDENT_AMBULATORY_CARE_PROVIDER_SITE_OTHER): Payer: Medicare Other

## 2013-08-19 VITALS — BP 132/58 | HR 67 | Temp 98.3°F | Ht 59.0 in | Wt 274.0 lb

## 2013-08-19 DIAGNOSIS — Z1239 Encounter for other screening for malignant neoplasm of breast: Secondary | ICD-10-CM

## 2013-08-19 DIAGNOSIS — G4730 Sleep apnea, unspecified: Secondary | ICD-10-CM | POA: Diagnosis not present

## 2013-08-19 DIAGNOSIS — M545 Low back pain, unspecified: Secondary | ICD-10-CM | POA: Diagnosis not present

## 2013-08-19 DIAGNOSIS — I5032 Chronic diastolic (congestive) heart failure: Secondary | ICD-10-CM | POA: Diagnosis not present

## 2013-08-19 LAB — BASIC METABOLIC PANEL
BUN: 71 mg/dL — AB (ref 6–23)
CHLORIDE: 85 meq/L — AB (ref 96–112)
CO2: 41 mEq/L — ABNORMAL HIGH (ref 19–32)
CREATININE: 1.5 mg/dL — AB (ref 0.4–1.2)
Calcium: 10 mg/dL (ref 8.4–10.5)
GFR: 36.69 mL/min — ABNORMAL LOW (ref >60.00)
GLUCOSE: 130 mg/dL — AB (ref 70–99)
Potassium: 3.2 mEq/L — ABNORMAL LOW (ref 3.5–5.1)
Sodium: 140 mEq/L (ref 135–145)

## 2013-08-19 LAB — LIPID PANEL
CHOL/HDL RATIO: 7
Cholesterol: 366 mg/dL — ABNORMAL HIGH (ref 0–200)
HDL: 51.8 mg/dL (ref >39.00)
LDL CALC: 238 mg/dL — AB (ref 0–99)
NONHDL: 314.2
Triglycerides: 382 mg/dL — ABNORMAL HIGH (ref 0.0–149.0)
VLDL: 76.4 mg/dL — ABNORMAL HIGH (ref 0.0–40.0)

## 2013-08-19 MED ORDER — PREGABALIN 100 MG PO CAPS: 100.0000 mg | ORAL_CAPSULE | Freq: Three times a day (TID) | ORAL | Status: DC

## 2013-08-19 MED ORDER — POTASSIUM CHLORIDE CRYS ER 20 MEQ PO TBCR: EXTENDED_RELEASE_TABLET | ORAL | Status: DC

## 2013-08-19 MED ORDER — DULOXETINE HCL 60 MG PO CPEP: 60.0000 mg | ORAL_CAPSULE | Freq: Every day | ORAL | Status: DC

## 2013-08-19 NOTE — Patient Instructions (Addendum)
It was good to see you today.  We have reviewed your prior records including labs and tests today  Test(s) ordered today. Your results will be released to [place] (or called to you) after review, usually within 72hours after test completion. If any changes need to be made, you will be notified at that same time.  Medications reviewed and updated  Increase Lyrica to 100 mg 3 times daily Increase cymbalta to 60mg  daily No other changes  Your prescription(s) have been submitted to your pharmacy. Please take as directed and contact our office if you believe you are having problem(s) with the medication(s).  we'll make referral to mammography screening and to pain clinic. Also followup with sleep specialist. Our office will contact you regarding appointment(s) once made.  Please schedule followup in 4 months, call sooner if problems.  Exercise to Lose Weight Exercise and a healthy diet may help you lose weight. Your doctor may suggest specific exercises. EXERCISE IDEAS AND TIPS  Choose low-cost things you enjoy doing, such as walking, bicycling, or exercising to workout videos.  Take stairs instead of the elevator.  Walk during your lunch break.  Park your car further away from work or school.  Go to a gym or an exercise class.  Start with 5 to 10 minutes of exercise each day. Build up to 30 minutes of exercise 4 to 6 days a week.  Wear shoes with good support and comfortable clothes.  Stretch before and after working out.  Work out until you breathe harder and your heart beats faster.  Drink extra water when you exercise.  Do not do so much that you hurt yourself, feel dizzy, or get very short of breath. Exercises that burn about 150 calories:  Running 1  miles in 15 minutes.  Playing volleyball for 45 to 60 minutes.  Washing and waxing a car for 45 to 60 minutes.  Playing touch football for 45 minutes.  Walking 1  miles in 35 minutes.  Pushing a stroller 1   miles in 30 minutes.  Playing basketball for 30 minutes.  Raking leaves for 30 minutes.  Bicycling 5 miles in 30 minutes.  Walking 2 miles in 30 minutes.  Dancing for 30 minutes.  Shoveling snow for 15 minutes.  Swimming laps for 20 minutes.  Walking up stairs for 15 minutes.  Bicycling 4 miles in 15 minutes.  Gardening for 30 to 45 minutes.  Jumping rope for 15 minutes.  Washing windows or floors for 45 to 60 minutes. Document Released: 03/12/2010 Document Revised: 05/02/2011 Document Reviewed: 03/12/2010 [facility] Patient Information 2014 [place], [place].

## 2013-08-19 NOTE — Assessment & Plan Note (Signed)
No neuro deficits on exam -  Chronic DDD 05/2010 and facet mediated pain -prior ortho eval for same Increase cymbalta and lyrica dose now Refer to pain clinic continue tramadol and prn percocet (preferred to norco) -  avoiding NSAIDs due to lymphedema

## 2013-08-19 NOTE — Progress Notes (Signed)
Pre visit review using our clinic review tool, if applicable. No additional management support is needed unless otherwise documented below in the visit note. 

## 2013-08-19 NOTE — Assessment & Plan Note (Signed)
Chronic hypersomnia/insomnia -  Prior sleep pulm (alva) eval reviewed - unable to tol trial home split study and CPAP, but needs follow up given RHF per CHF clinic encouraged to continue working on weight loss and continue requip for RLS symptoms

## 2013-08-19 NOTE — Assessment & Plan Note (Signed)
Following with cardiology/CHF for same, volume status complicated by renal insufficiency and chronic venous insufficiency Remains on loop diuretics (torsemide more effective than furosemide)-  weight changes and edema reviewed recheck BMet now -  Continue toresimide 80 bid and zaroxolyn at 2.5mg  3x/week TTS Cards changed amlodipine to hydralazine s/p RHC 04/06/11 Per cardiology, also on Coreg to decrease of the mid cavity gradient Medications and interval hx reviewed, reminded of importance of diuretic, diet and other med compliance

## 2013-08-19 NOTE — Progress Notes (Signed)
Subjective:    Patient ID: Melissa Myers, female    DOB: February 27, 1945, 68 y.o.   MRN: [id number]  Leg Pain     Patient is here for follow up  Reviewed chronic medical issues and interval medical events  Past Medical History  Diagnosis Date  . Type II or unspecified type diabetes mellitus without mention of complication, not stated as uncontrolled     insulin dep  . Hyperlipidemia     hx rhabdo on statins  . Hypertension   . Diastolic CHF   . Chronic diarrhea     a/w nausea - felt related to IBS  . RLS (restless legs syndrome)   . Osteoarthritis   . Stasis dermatitis   . GERD (gastroesophageal reflux disease)   . On home oxygen therapy     uses oxygen 2 liters min per [place] at night and prn during day  . Anemia   . Neuropathy     feet, toes and fingers  . Torn ligament     left knee for last 6 months  . Disc degeneration, lumbar   . OSA (obstructive sleep apnea)     05/2009 sleep study - refuses CPAP  . Deaf     left side only  . Shortness of breath   . Neuromuscular disorder     NEUROPATHY    Review of Systems  Constitutional: Positive for fatigue. Negative for fever.  Respiratory: Positive for shortness of breath. Negative for cough.   Cardiovascular: Negative for chest pain and palpitations. Leg swelling: chronic but improved.  Musculoskeletal: Positive for arthralgias (chronic), back pain (chronic) and myalgias (chronic).       Objective:   Physical Exam  BP 132/58  Pulse 67  Temp(Src) 98.3 F (36.8 C) (Oral)  Ht 4\' 11"  (1.499 m)  Wt 274 lb (124.286 kg)  BMI 55.31 kg/m2  SpO2 90% Wt Readings from Last 3 Encounters:  08/19/13 274 lb (124.286 kg)  08/14/13 284 lb 2 oz (128.878 kg)  08/07/13 286 lb 4 oz (129.842 kg)    Constitutional: She is MO, but appears well-developed and well-nourished. No distress. family (spouse and son) at side Neck: Normal range of motion. Neck supple. No JVD present. No thyromegaly present.  Cardiovascular: distant but  appears normal rate, regular rhythm with normal heart sounds.  No murmur heard. chronic 1+ BLE edema. Pulmonary/Chest: Effort normal at rest and breath sounds normal. No respiratory distress. She has no wheezes.  Psychiatric: She has a normal mood and affect. Her behavior is normal. Judgment/insight limited and thought content normal.   Lab Results  Component Value Date   WBC 9.8 07/31/2013   HGB 11.7* 07/31/2013   HCT 37.2 07/31/2013   PLT 283 07/31/2013   GLUCOSE 134* 08/14/2013   CHOL 221* 08/03/2012   TRIG 188.0* 08/03/2012   HDL 54.80 08/03/2012   LDLDIRECT 127.5 08/03/2012   LDLCALC 115 12/02/2009   ALT 21 07/31/2013   AST 32 07/31/2013   NA 143 08/14/2013   K 3.5* 08/14/2013   CL 93* 08/14/2013   CREATININE 0.84 08/14/2013   BUN 53* 08/14/2013   CO2 37* 08/14/2013   TSH 2.010 07/17/2013   INR 1.10 07/11/2012   HGBA1C 8.2* 07/30/2013   MICROALBUR 1.3 10/24/2012    No results found.     Assessment & Plan:   Problem List Items Addressed This Visit   BACK PAIN, LUMBAR, CHRONIC     No neuro deficits on exam -  Chronic DDD  05/2010 and facet mediated pain -prior ortho eval for same Increase cymbalta and lyrica dose now Refer to pain clinic continue tramadol and prn percocet (preferred to norco) -  avoiding NSAIDs due to lymphedema    Relevant Orders      Ambulatory referral to Pain Clinic   Chronic diastolic heart failure - Primary     Following with cardiology/CHF for same, volume status complicated by renal insufficiency and chronic venous insufficiency Remains on loop diuretics (torsemide more effective than furosemide)-  weight changes and edema reviewed recheck BMet now -  Continue toresimide 80 bid and zaroxolyn at 2.5mg  3x/week TTS Cards changed amlodipine to hydralazine s/p RHC 04/06/11 Per cardiology, also on Coreg to decrease of the mid cavity gradient Medications and interval hx reviewed, reminded of importance of diuretic, diet and other med compliance    Relevant Orders       Ambulatory referral to Pulmonology      Basic metabolic panel      Lipid panel   OBSTRUCTIVE SLEEP APNEA     Chronic hypersomnia/insomnia -  Prior sleep pulm (alva) eval reviewed - unable to tol trial home split study and CPAP, but needs follow up given RHF per CHF clinic encouraged to continue working on weight loss and continue requip for RLS symptoms    Relevant Orders      Ambulatory referral to Pulmonology    Other Visit Diagnoses   Other screening breast examination        Relevant Orders       MM DIGITAL SCREENING BILATERAL

## 2013-08-19 NOTE — Addendum Note (Signed)
Addended by: Gwendolyn Grant A on: 08/19/2013 12:40 PM   Modules accepted: Orders, Medications

## 2013-08-20 ENCOUNTER — Other Ambulatory Visit: Payer: Self-pay | Admitting: Endocrinology

## 2013-08-20 DIAGNOSIS — G4730 Sleep apnea, unspecified: Secondary | ICD-10-CM | POA: Diagnosis not present

## 2013-08-20 DIAGNOSIS — I509 Heart failure, unspecified: Secondary | ICD-10-CM | POA: Diagnosis not present

## 2013-08-20 DIAGNOSIS — Z794 Long term (current) use of insulin: Secondary | ICD-10-CM | POA: Diagnosis not present

## 2013-08-20 DIAGNOSIS — IMO0001 Reserved for inherently not codable concepts without codable children: Secondary | ICD-10-CM | POA: Diagnosis not present

## 2013-08-20 DIAGNOSIS — I5033 Acute on chronic diastolic (congestive) heart failure: Secondary | ICD-10-CM | POA: Diagnosis not present

## 2013-08-20 DIAGNOSIS — I10 Essential (primary) hypertension: Secondary | ICD-10-CM | POA: Diagnosis not present

## 2013-08-21 DIAGNOSIS — I10 Essential (primary) hypertension: Secondary | ICD-10-CM | POA: Diagnosis not present

## 2013-08-21 DIAGNOSIS — Z794 Long term (current) use of insulin: Secondary | ICD-10-CM | POA: Diagnosis not present

## 2013-08-21 DIAGNOSIS — I509 Heart failure, unspecified: Secondary | ICD-10-CM | POA: Diagnosis not present

## 2013-08-21 DIAGNOSIS — G4730 Sleep apnea, unspecified: Secondary | ICD-10-CM | POA: Diagnosis not present

## 2013-08-21 DIAGNOSIS — I5033 Acute on chronic diastolic (congestive) heart failure: Secondary | ICD-10-CM | POA: Diagnosis not present

## 2013-08-21 DIAGNOSIS — IMO0001 Reserved for inherently not codable concepts without codable children: Secondary | ICD-10-CM | POA: Diagnosis not present

## 2013-08-22 ENCOUNTER — Encounter (HOSPITAL_COMMUNITY): Payer: Self-pay

## 2013-08-22 ENCOUNTER — Ambulatory Visit (HOSPITAL_COMMUNITY)
Admission: RE | Admit: 2013-08-22 | Discharge: 2013-08-22 | Disposition: A | Discharge status: Home / Self Care | Payer: Medicare Other | Source: Ambulatory Visit | Attending: Internal Medicine | Admitting: Internal Medicine

## 2013-08-22 ENCOUNTER — Ambulatory Visit: Payer: Medicare Other | Admitting: Cardiology

## 2013-08-22 VITALS — BP 137/65 | HR 77 | Resp 20 | Wt 270.0 lb

## 2013-08-22 DIAGNOSIS — N289 Disorder of kidney and ureter, unspecified: Secondary | ICD-10-CM | POA: Diagnosis not present

## 2013-08-22 DIAGNOSIS — G4730 Sleep apnea, unspecified: Secondary | ICD-10-CM | POA: Diagnosis not present

## 2013-08-22 DIAGNOSIS — I10 Essential (primary) hypertension: Secondary | ICD-10-CM | POA: Insufficient documentation

## 2013-08-22 DIAGNOSIS — N259 Disorder resulting from impaired renal tubular function, unspecified: Secondary | ICD-10-CM

## 2013-08-22 DIAGNOSIS — Z794 Long term (current) use of insulin: Secondary | ICD-10-CM | POA: Diagnosis not present

## 2013-08-22 DIAGNOSIS — E669 Obesity, unspecified: Secondary | ICD-10-CM | POA: Insufficient documentation

## 2013-08-22 DIAGNOSIS — I5032 Chronic diastolic (congestive) heart failure: Secondary | ICD-10-CM | POA: Insufficient documentation

## 2013-08-22 DIAGNOSIS — IMO0001 Reserved for inherently not codable concepts without codable children: Secondary | ICD-10-CM | POA: Diagnosis not present

## 2013-08-22 DIAGNOSIS — G4733 Obstructive sleep apnea (adult) (pediatric): Secondary | ICD-10-CM | POA: Diagnosis not present

## 2013-08-22 DIAGNOSIS — I5033 Acute on chronic diastolic (congestive) heart failure: Secondary | ICD-10-CM | POA: Diagnosis not present

## 2013-08-22 DIAGNOSIS — I509 Heart failure, unspecified: Secondary | ICD-10-CM | POA: Diagnosis not present

## 2013-08-22 MED ORDER — METOLAZONE 2.5 MG PO TABS: ORAL_TABLET | ORAL | Status: DC

## 2013-08-22 NOTE — Patient Instructions (Addendum)
Hold the rest of your fluid pills today but take your potassium.  Hold fluid pills tomorrow.  On Saturday pick back up taking regular dose.  Stop 2.5 mg of metolazone on Thursdays.  Follow up in 2 weeks and please bring all medications.   BMET next week.  Do the following things EVERYDAY: 1) Weigh yourself in the morning before breakfast. Write it down and keep it in a log. 2) Take your medicines as prescribed 3) Eat low salt foods-Limit salt (sodium) to 2000 mg per day.  4) Stay as active as you can everyday 5) Limit all fluids for the day to less than 2 liters\

## 2013-08-22 NOTE — Progress Notes (Addendum)
Patient ID: Melissa Myers, female   DOB: 1946/01/02, 68 y.o.   MRN: [id number]  PCP: Dr. Asa Lente Endocrinologist: Dr Loanne Drilling  68 yo with history of HTN, DM, hyperlipidemia, OHS/OSA, and chronic dyspnea/diastolic CHF.  Patient had an echo in 04/2009 showing moderate LVH and preserved LV systolic function. However, there was a very large LV mid-cavity gradient with valsalva.  Patient developed quite significant exertional dyspnea to the point where she was short of breath walking around her house. She had a pulmonary evaluation with Dr. Elsworth Soho but no primary lung problems were identified. Patient had an ETT-myoview in 05/2009 which was negative for ischemia or infarction. Right heart cath in 05/2009 showed mildly elevated right heart filling pressures but normal PA pressure and normal PCWP. She was started her on a beta blocker (Coreg) to try to lower her LV mid-cavity gradient (that likely occurs with exertion) and to better control BP.  V/Q scan was negative for PE.  PFTs from 06/2011 showed a restrictive defect. Last echo in 06/2013 showed EF 60-65% with normal RV size and systolic function.      Follow up for Heart Failure: Last visit took metolazone 5 mg for one time dose for volume overload. Saw PCP and recommended her to pain management clinic. Doing well. Denies SOB, PND, orthopnea or CP. Weight at home 270 lbs. Patient reports that she has missed taking pills two times even with son calling and reminding her everyday to take medications. Walking a lot more and reports no SOB. Intolerant CPAP in past but reconsidering. Following a low salt diet and trying to drink less than 2L a day. Treated for UTI with cipro which she finished.   Labs (10/13): K 4.1, creatinine 0.85 Labs (4/14): K 3.7, creatinine 1.2, BUN 43, BNP 23 Labs (5/14): K 4.1, creatinine 1.0 Labs (7/14): K 4.6 Creatinine 1.0 BNP 39.0  Labs (8/14): K 3.5, creatinine 0.91, BNP 71 Labs (11/14): K 3.7, creatinine 0.9 Labs (12/14): K 4.1,  creatinine 0.8, BNP 52 Labs (3/15): K 4, creatinine 0.9 Labs (4/15): K 3.7, creatinine 0.9 Labs (5/15): K 3.8, creatinine 0.9, BNP 29 Labs (6/15): K 3.4, BUN 105, creatinine 0.9 =>1.7 Labs (6/17/5) K 4.2, BUN 41, Creatinine 0.80 Labs (08/19/13) K 3.2, BUN 71, creatinine 1.5  Allergies (verified):  1) ! Sulfa  2) Morphine   Past Medical History:  1. Diabetes mellitus, type II  2. Hyperlipidemia: She has been unable to tolerate statins (has been on Vytorin, lovastatin, and Crestor) due to what sounds like rhabdo: developed muscle weakness and was told she had "muscle damage" with each of these statins. She has been told not to take statins anymore.  3. Hypertension 4. Obesity  5. Restless Leg Syndrome  6. OA  7. peripheral neuropathy  8. OHS/OSA: Uses supplemental oxygen, does not tolerate CPAP.   9. Chronic nausea/diarrhea: ? IBS  10. Diastolic CHF: Echo (2/45) with normal LV size, moderate LVH, EF 65-70%, LV mid-cavity gradient reaching 63 mmHg with valsalva but minimal at rest, grade I diastolic dysfunction, cannot estimate PA systolic pressure (no TR doppler signal), RV normal. RHC (4/11): Mean RA 11, RV 37/13, PA 33/15, mean PCWP 12, CI 3.3. Echo in 11/12 showed mild LVH, EF 65-70%, no LV mid-cavity gradient was mentioned.  Echo in 4/14 showed EF 65-70%, mild LVH, grade I diastolic dysfunction, PA systolic pressure 35 mmHg, mild LV mid-cavity gradient.  Echo (5/15) with EF 60-65%, mild TR, normal RV size and systolic function.  11. ETT-myoview (4/11):  5'30", stopped due to fatigue, normal EF, no evidence for scar or ischemia. 12. V/Q scan 11/12: negative for PE.  Lower extremity venous doppler US (3/14): negative for DVT.  13. PFTs (5/13): FVC 50%, FEV1 62%, ratio 106%, DLCO 69%, TLC 69%.   Family History:  Family History of Alcoholism/Addiction (parent)  Family History Diabetes 1st degree relative (grandparent)  Family History High cholesterol (parent, grandparent)  Family History  Hypertension (parent, grandparent)  Family History Lung cancer (grandparent)  Stomach cancer (grandmother)  Celiac Sprue daughter  Mother with MI at 67, father with MI at 75, uncle with MI at 64, brother with stents in his 69s, multiple aunts with MIs and CVAs   Social History:  Never Smoked  no alcohol  married, lives with spouse and her mother  retired Network engineer - now housewife  Alcohol Use - no  Illicit Drug Use - no  Review of Systems  All systems reviewed and negative except as per HPI.   ROS: All systems reviewed and negative except as per HPI.   Current Outpatient Prescriptions  Medication Sig Dispense Refill  . aspirin 81 MG tablet Take 81 mg by mouth daily.       . carvedilol (COREG) 25 MG tablet Take 1 tablet (25 mg total) by mouth 2 (two) times daily with a meal.  60 tablet  6  . cholecalciferol (VITAMIN D) 1000 UNITS tablet Take 1,000 Units by mouth daily.      . Coenzyme Q10 (COQ10) 30 MG CAPS Take 30 mg by mouth daily.       . DULoxetine (CYMBALTA) 60 MG capsule Take 1 capsule (60 mg total) by mouth daily.  90 capsule  3  . fenofibrate 160 MG tablet Take 160 mg by mouth every morning.      . insulin glargine (LANTUS) 100 UNIT/ML injection Inject 60 Units into the skin at bedtime.      . insulin glulisine (APIDRA) 100 UNIT/ML injection Inject 60-70 Units into the skin See admin instructions. Take 60 units before breakfast and lunch, and 70 units before dinner.      [name] LANTUS 100 UNIT/ML injection INJECT 60 UNITS EVERY NIGHT AT BEDTIME  30 mL  0  . Magnesium 200 MG TABS Take 200 mg by mouth daily.   60 each    . metolazone (ZAROXOLYN) 2.5 MG tablet Take 2.5 mg by mouth 3 (three) times a week. Take on Tuesday, Thursday and Saturday.      [name] omega-3 acid ethyl esters (LOVAZA) 1 G capsule TAKE 2 CAPSULES BY MOUTH TWICE DAILY  180 capsule  0  . oxyCODONE-acetaminophen (PERCOCET/ROXICET) 5-325 MG per tablet Take 1 tablet by mouth every 6 (six) hours as needed for moderate pain or  severe pain.      . potassium chloride SA (K-DUR,KLOR-CON) 20 MEQ tablet 2 tab (57meq) 3x/day on Tue/Thur/Sat AND 2 tab (19meq) 2x/day all other days  100 tablet  3  . pregabalin (LYRICA) 100 MG capsule Take 1 capsule (100 mg total) by mouth 3 (three) times daily.  90 capsule  5  . rOPINIRole (REQUIP) 2 MG tablet Take 2 mg by mouth 2 (two) times daily.      [name] torsemide (DEMADEX) 20 MG tablet TAKE 4 TABLETS BY MOUTH TWICE DAILY.  240 tablet  0  . hydrALAZINE (APRESOLINE) 25 MG tablet Take 1 tablet (25 mg total) by mouth 3 (three) times daily.  90 tablet  6   No current facility-administered medications for this encounter.  Filed Vitals:   08/22/13 1121  BP: 137/65  Pulse: 77  Resp: 20  Weight: 270 lb (122.471 kg)  SpO2: 90%    General: NAD, obese Son and husband Neck: Thick, JVP difficult to assess d/t body habitus but appears 8, no thyromegaly or thyroid nodule.  Lungs: Clear to auscultation bilaterally with normal respiratory effort. Chronic 2L oxygen CV: Nondisplaced PMI.  Heart regular S1/S2, no S3/S4, 2/6 SEM RUSB.  2+ bilateral woody edema with chronic erythema.  No carotid bruit.  Abdomen: Soft, nontender, no hepatosplenomegaly, + distended. Obese Neurologic: Alert and oriented x 3.  Psych: Normal affect. Extremities: No clubbing/cyanosis, bilateral venous stasis  Assessment/Plan:  1. Chronic diastolic CHF:   - NYHA class III  symptoms and volume status improved. Labs reviewed from PCP and Creatinine elevated and K+ low. Instructed to hold diuretics today and tomorrow then go back to torsemide 80 mg BID. Will stop metolazone on Thursday and continue on Tuesday and Saturday. Take potassium today. Check BMET next week.  - Her son is managing her medications to ensure compliance and calling her everyday to take her medications and she still is not taking them when he calls. Reinforced once again the need to take medications as prescribed and if she can't may need to be in home.  -  Reinforced the need and importance of daily weights, a low sodium diet, and fluid restriction (less than 2 L a day). Instructed to call the HF clinic if weight increases more than 3 lbs overnight or 5 lbs in a week.  2. Pulmonary: Suspect OHS/OSA.   - Will refer back to pulmonary for OSA. She reports now that she is willing to try CPAP with nasal pillows.  - Continue home oxygen continuous. 3. Obesity - Have reinforced the need to cut back on calories and to be as active as possible  4. Renal insufficieny - Very difficult to know what patient's baseline is because she is so sporadic with medications. Thinking her baseline is going to be closer to 1.3-1.5. Will continue to follow.   F/U 2 weeks.  Junie Bame B NP-C  08/22/2013

## 2013-08-25 NOTE — Progress Notes (Signed)
She was treated with Cipro for 10 days for UTI starting on June 24.   Shane Melby NP-C 1:54 PM

## 2013-08-27 DIAGNOSIS — G4730 Sleep apnea, unspecified: Secondary | ICD-10-CM | POA: Diagnosis not present

## 2013-08-27 DIAGNOSIS — I509 Heart failure, unspecified: Secondary | ICD-10-CM | POA: Diagnosis not present

## 2013-08-27 DIAGNOSIS — IMO0001 Reserved for inherently not codable concepts without codable children: Secondary | ICD-10-CM | POA: Diagnosis not present

## 2013-08-27 DIAGNOSIS — Z794 Long term (current) use of insulin: Secondary | ICD-10-CM | POA: Diagnosis not present

## 2013-08-27 DIAGNOSIS — I10 Essential (primary) hypertension: Secondary | ICD-10-CM | POA: Diagnosis not present

## 2013-08-27 DIAGNOSIS — I5033 Acute on chronic diastolic (congestive) heart failure: Secondary | ICD-10-CM | POA: Diagnosis not present

## 2013-08-30 ENCOUNTER — Ambulatory Visit: Payer: Medicare Other

## 2013-09-02 ENCOUNTER — Other Ambulatory Visit: Payer: Self-pay | Admitting: Internal Medicine

## 2013-09-02 DIAGNOSIS — IMO0001 Reserved for inherently not codable concepts without codable children: Secondary | ICD-10-CM | POA: Diagnosis not present

## 2013-09-02 DIAGNOSIS — I10 Essential (primary) hypertension: Secondary | ICD-10-CM | POA: Diagnosis not present

## 2013-09-02 DIAGNOSIS — Z794 Long term (current) use of insulin: Secondary | ICD-10-CM | POA: Diagnosis not present

## 2013-09-02 DIAGNOSIS — G4730 Sleep apnea, unspecified: Secondary | ICD-10-CM | POA: Diagnosis not present

## 2013-09-02 DIAGNOSIS — I509 Heart failure, unspecified: Secondary | ICD-10-CM | POA: Diagnosis not present

## 2013-09-02 DIAGNOSIS — I5033 Acute on chronic diastolic (congestive) heart failure: Secondary | ICD-10-CM | POA: Diagnosis not present

## 2013-09-05 ENCOUNTER — Encounter (HOSPITAL_COMMUNITY): Payer: Self-pay

## 2013-09-05 ENCOUNTER — Ambulatory Visit (HOSPITAL_COMMUNITY)
Admission: RE | Admit: 2013-09-05 | Discharge: 2013-09-05 | Disposition: A | Discharge status: Home / Self Care | Payer: Medicare Other | Source: Ambulatory Visit | Attending: Cardiology | Admitting: Cardiology

## 2013-09-05 VITALS — BP 134/53 | HR 71 | Resp 18 | Wt 272.2 lb

## 2013-09-05 DIAGNOSIS — G609 Hereditary and idiopathic neuropathy, unspecified: Secondary | ICD-10-CM | POA: Diagnosis not present

## 2013-09-05 DIAGNOSIS — Z888 Allergy status to other drugs, medicaments and biological substances status: Secondary | ICD-10-CM | POA: Insufficient documentation

## 2013-09-05 DIAGNOSIS — I5032 Chronic diastolic (congestive) heart failure: Secondary | ICD-10-CM | POA: Diagnosis not present

## 2013-09-05 DIAGNOSIS — Z885 Allergy status to narcotic agent status: Secondary | ICD-10-CM | POA: Insufficient documentation

## 2013-09-05 DIAGNOSIS — G4733 Obstructive sleep apnea (adult) (pediatric): Secondary | ICD-10-CM | POA: Insufficient documentation

## 2013-09-05 DIAGNOSIS — I509 Heart failure, unspecified: Secondary | ICD-10-CM | POA: Diagnosis not present

## 2013-09-05 DIAGNOSIS — N259 Disorder resulting from impaired renal tubular function, unspecified: Secondary | ICD-10-CM

## 2013-09-05 DIAGNOSIS — Z794 Long term (current) use of insulin: Secondary | ICD-10-CM | POA: Insufficient documentation

## 2013-09-05 DIAGNOSIS — E785 Hyperlipidemia, unspecified: Secondary | ICD-10-CM | POA: Diagnosis not present

## 2013-09-05 DIAGNOSIS — M549 Dorsalgia, unspecified: Secondary | ICD-10-CM | POA: Insufficient documentation

## 2013-09-05 DIAGNOSIS — Z9981 Dependence on supplemental oxygen: Secondary | ICD-10-CM | POA: Insufficient documentation

## 2013-09-05 DIAGNOSIS — I10 Essential (primary) hypertension: Secondary | ICD-10-CM | POA: Insufficient documentation

## 2013-09-05 DIAGNOSIS — N289 Disorder of kidney and ureter, unspecified: Secondary | ICD-10-CM | POA: Insufficient documentation

## 2013-09-05 DIAGNOSIS — G2581 Restless legs syndrome: Secondary | ICD-10-CM | POA: Insufficient documentation

## 2013-09-05 DIAGNOSIS — Z882 Allergy status to sulfonamides status: Secondary | ICD-10-CM | POA: Diagnosis not present

## 2013-09-05 DIAGNOSIS — Z7982 Long term (current) use of aspirin: Secondary | ICD-10-CM | POA: Diagnosis not present

## 2013-09-05 DIAGNOSIS — M1990 Unspecified osteoarthritis, unspecified site: Secondary | ICD-10-CM | POA: Diagnosis not present

## 2013-09-05 DIAGNOSIS — E669 Obesity, unspecified: Secondary | ICD-10-CM | POA: Diagnosis not present

## 2013-09-05 LAB — BASIC METABOLIC PANEL
ANION GAP: 15 (ref 5–15)
BUN: 54 mg/dL — ABNORMAL HIGH (ref 6–23)
CO2: 39 mEq/L — ABNORMAL HIGH (ref 19–32)
Calcium: 9.6 mg/dL (ref 8.4–10.5)
Chloride: 87 mEq/L — ABNORMAL LOW (ref 96–112)
Creatinine, Ser: 1.14 mg/dL — ABNORMAL HIGH (ref 0.50–1.10)
GFR calc Af Amer: 56 mL/min — ABNORMAL LOW (ref >90)
GFR, EST NON AFRICAN AMERICAN: 48 mL/min — AB (ref >90)
Glucose, Bld: 155 mg/dL — ABNORMAL HIGH (ref 70–99)
Potassium: 4.1 mEq/L (ref 3.7–5.3)
SODIUM: 141 meq/L (ref 137–147)

## 2013-09-05 NOTE — Progress Notes (Signed)
Patient ID: Melissa Myers, female   DOB: August 03, 1945, 68 y.o.   MRN: [id number]  PCP: Dr. Asa Lente Endocrinologist: Dr Loanne Drilling  68 yo with history of HTN, DM, hyperlipidemia, OHS/OSA, and chronic dyspnea/diastolic CHF.  Patient had an echo in 04/2009 showing moderate LVH and preserved LV systolic function. However, there was a very large LV mid-cavity gradient with valsalva.  Patient developed quite significant exertional dyspnea to the point where she was short of breath walking around her house. She had a pulmonary evaluation with Dr. Elsworth Soho but no primary lung problems were identified. Patient had an ETT-myoview in 05/2009 which was negative for ischemia or infarction. Right heart cath in 05/2009 showed mildly elevated right heart filling pressures but normal PA pressure and normal PCWP. She was started her on a beta blocker (Coreg) to try to lower her LV mid-cavity gradient (that likely occurs with exertion) and to better control BP.  V/Q scan was negative for PE.  PFTs from 06/2011 showed a restrictive defect. Last echo in 06/2013 showed EF 60-65% with normal RV size and systolic function.      Follow up for Heart Failure: Last visit renal function elevated and instructed to hold diuretics for two days and changed metolazone to only Tuesday and Saturday. Overall feeling good. Pain in feet much better. Denies SOB, orthopnea, CP or PND. Walking around at the house and having no issues. Has appt with Dr. Elsworth Soho about reconsidering CPAP machine with nasal pillows. Weight at home 272 lbs. Following a low salt diet and trying to drink less than 2L a day.   Labs (10/13): K 4.1, creatinine 0.85 Labs (4/14): K 3.7, creatinine 1.2, BUN 43, BNP 23 Labs (5/14): K 4.1, creatinine 1.0 Labs (7/14): K 4.6 Creatinine 1.0 BNP 39.0  Labs (8/14): K 3.5, creatinine 0.91, BNP 71 Labs (11/14): K 3.7, creatinine 0.9 Labs (12/14): K 4.1, creatinine 0.8, BNP 52 Labs (3/15): K 4, creatinine 0.9 Labs (4/15): K 3.7, creatinine  0.9 Labs (5/15): K 3.8, creatinine 0.9, BNP 29 Labs (6/15): K 3.4, BUN 105, creatinine 0.9 =>1.7 Labs (6/17/5) K 4.2, BUN 41, Creatinine 0.80 Labs (08/19/13) K 3.2, BUN 71, creatinine 1.5  Allergies (verified):  1) ! Sulfa  2) Morphine   Past Medical History:  1. Diabetes mellitus, type II  2. Hyperlipidemia: She has been unable to tolerate statins (has been on Vytorin, lovastatin, and Crestor) due to what sounds like rhabdo: developed muscle weakness and was told she had "muscle damage" with each of these statins. She has been told not to take statins anymore.  3. Hypertension 4. Obesity  5. Restless Leg Syndrome  6. OA  7. peripheral neuropathy  8. OHS/OSA: Uses supplemental oxygen, does not tolerate CPAP.   9. Chronic nausea/diarrhea: ? IBS  10. Diastolic CHF: Echo (1/60) with normal LV size, moderate LVH, EF 65-70%, LV mid-cavity gradient reaching 63 mmHg with valsalva but minimal at rest, grade I diastolic dysfunction, cannot estimate PA systolic pressure (no TR doppler signal), RV normal. RHC (4/11): Mean RA 11, RV 37/13, PA 33/15, mean PCWP 12, CI 3.3. Echo in 11/12 showed mild LVH, EF 65-70%, no LV mid-cavity gradient was mentioned.  Echo in 4/14 showed EF 65-70%, mild LVH, grade I diastolic dysfunction, PA systolic pressure 35 mmHg, mild LV mid-cavity gradient.  Echo (5/15) with EF 60-65%, mild TR, normal RV size and systolic function.  11. ETT-myoview (4/11): 5'30", stopped due to fatigue, normal EF, no evidence for scar or ischemia. 12. V/Q scan 11/12:  negative for PE.  Lower extremity venous doppler US (3/14): negative for DVT.  13. PFTs (5/13): FVC 50%, FEV1 62%, ratio 106%, DLCO 69%, TLC 69%.   Family History:  Family History of Alcoholism/Addiction (parent)  Family History Diabetes 1st degree relative (grandparent)  Family History High cholesterol (parent, grandparent)  Family History Hypertension (parent, grandparent)  Family History Lung cancer (grandparent)  Stomach  cancer (grandmother)  Celiac Sprue daughter  Mother with MI at 29, father with MI at 27, uncle with MI at 21, brother with stents in his 85s, multiple aunts with MIs and CVAs   Social History:  Never Smoked  no alcohol  married, lives with spouse and her mother  retired Network engineer - now housewife  Alcohol Use - no  Illicit Drug Use - no  Review of Systems  All systems reviewed and negative except as per HPI.   ROS: All systems reviewed and negative except as per HPI.   Current Outpatient Prescriptions  Medication Sig Dispense Refill  . aspirin 81 MG tablet Take 81 mg by mouth daily.       . carvedilol (COREG) 25 MG tablet Take 1 tablet (25 mg total) by mouth 2 (two) times daily with a meal.  60 tablet  6  . cholecalciferol (VITAMIN D) 1000 UNITS tablet Take 1,000 Units by mouth daily.      . Coenzyme Q10 (COQ10) 30 MG CAPS Take 30 mg by mouth daily.       . DULoxetine (CYMBALTA) 60 MG capsule Take 1 capsule (60 mg total) by mouth daily.  90 capsule  3  . fenofibrate 160 MG tablet Take 160 mg by mouth every morning.      . fenofibrate 160 MG tablet TAKE 1 TABLET BY MOUTH EVERY DAY.  30 tablet  5  . hydrALAZINE (APRESOLINE) 25 MG tablet Take 1 tablet (25 mg total) by mouth 3 (three) times daily.  90 tablet  6  . insulin glargine (LANTUS) 100 UNIT/ML injection Inject 60 Units into the skin at bedtime.      . insulin glulisine (APIDRA) 100 UNIT/ML injection Inject 60-70 Units into the skin See admin instructions. Take 60 units before breakfast and lunch, and 70 units before dinner.      [name] LANTUS 100 UNIT/ML injection INJECT 60 UNITS EVERY NIGHT AT BEDTIME  30 mL  0  . Magnesium 200 MG TABS Take 200 mg by mouth daily.   60 each    . metolazone (ZAROXOLYN) 2.5 MG tablet Take on Tuesday and Saturday.  8 tablet  3  . omega-3 acid ethyl esters (LOVAZA) 1 G capsule TAKE 2 CAPSULES BY MOUTH TWICE DAILY  180 capsule  0  . oxyCODONE-acetaminophen (PERCOCET/ROXICET) 5-325 MG per tablet Take 1 tablet  by mouth every 6 (six) hours as needed for moderate pain or severe pain.      . potassium chloride SA (K-DUR,KLOR-CON) 20 MEQ tablet 2 tab (74meq) 3x/day on Tue/Thur/Sat AND 2 tab (66meq) 2x/day all other days  100 tablet  3  . pregabalin (LYRICA) 100 MG capsule Take 1 capsule (100 mg total) by mouth 3 (three) times daily.  90 capsule  5  . rOPINIRole (REQUIP) 2 MG tablet Take 2 mg by mouth 2 (two) times daily.      [name] torsemide (DEMADEX) 20 MG tablet TAKE 4 TABLETS BY MOUTH TWICE DAILY.  240 tablet  0   No current facility-administered medications for this encounter.   Filed Vitals:   09/05/13 1113  BP: 134/53  Pulse: 71  Resp: 18  Weight: 272 lb 4 oz (123.492 kg)  SpO2: 96%    General: NAD, obese Son and husband Neck: Thick, JVP difficult to assess d/t body habitus but appears 8, no thyromegaly or thyroid nodule.  Lungs: Clear to auscultation bilaterally with normal respiratory effort. Chronic 2L oxygen CV: Nondisplaced PMI.  Heart regular S1/S2, no S3/S4, 2/6 SEM RUSB.  1+ bilateral woody edema with chronic erythema.  No carotid bruit.  Abdomen: Soft, nontender, no hepatosplenomegaly, + distended. Obese Neurologic: Alert and oriented x 3.  Psych: Normal affect. Extremities: No clubbing/cyanosis, bilateral venous stasis  Assessment/Plan:  1. Chronic diastolic CHF:   - Chronic NYHA III symptoms and volume status stable. Will continue torsemide 80 mg BID and metolazone 2.5 mg on Tuesdays and Saturdays. Check BMET today. - Her son is managing her medications to ensure compliance and calling her everyday to take her medications which has helped tremendously. - Reinforced the need and importance of daily weights, a low sodium diet, and fluid restriction (less than 2 L a day). Instructed to call the HF clinic if weight increases more than 3 lbs overnight or 5 lbs in a week.  2. Pulmonary: Suspect OHS/OSA.   - Has apt with pulmonary to discuss CPAP and possible nasal pillows. She is  willing to try again. - Continue home oxygen continuous. 3. Obesity - Have reinforced the need to cut back on calories and to be as active as possible. She reports that she is trying to lose weight.   4. Renal insufficieny - Baseline 1.3-1.6. Will continue to follow. Check BMET today.  F/U 3 weeks.  Junie Bame B NP-C  09/05/2013

## 2013-09-05 NOTE — Patient Instructions (Signed)
Doing great.  Continue doing what you are doing and behave!!!!  Call if your weight starts trending up.  Follow up 3 weeks.  Do the following things EVERYDAY: 1) Weigh yourself in the morning before breakfast. Write it down and keep it in a log. 2) Take your medicines as prescribed 3) Eat low salt foods-Limit salt (sodium) to 2000 mg per day.  4) Stay as active as you can everyday 5) Limit all fluids for the day to less than 2 liters 6)

## 2013-09-06 DIAGNOSIS — G4730 Sleep apnea, unspecified: Secondary | ICD-10-CM | POA: Diagnosis not present

## 2013-09-06 DIAGNOSIS — IMO0001 Reserved for inherently not codable concepts without codable children: Secondary | ICD-10-CM | POA: Diagnosis not present

## 2013-09-06 DIAGNOSIS — I5033 Acute on chronic diastolic (congestive) heart failure: Secondary | ICD-10-CM | POA: Diagnosis not present

## 2013-09-06 DIAGNOSIS — I509 Heart failure, unspecified: Secondary | ICD-10-CM | POA: Diagnosis not present

## 2013-09-06 DIAGNOSIS — Z794 Long term (current) use of insulin: Secondary | ICD-10-CM | POA: Diagnosis not present

## 2013-09-06 DIAGNOSIS — I10 Essential (primary) hypertension: Secondary | ICD-10-CM | POA: Diagnosis not present

## 2013-09-09 ENCOUNTER — Ambulatory Visit (INDEPENDENT_AMBULATORY_CARE_PROVIDER_SITE_OTHER): Payer: Medicare Other | Admitting: Podiatry

## 2013-09-09 DIAGNOSIS — L608 Other nail disorders: Secondary | ICD-10-CM | POA: Diagnosis not present

## 2013-09-09 DIAGNOSIS — E1142 Type 2 diabetes mellitus with diabetic polyneuropathy: Secondary | ICD-10-CM

## 2013-09-09 DIAGNOSIS — E1149 Type 2 diabetes mellitus with other diabetic neurological complication: Secondary | ICD-10-CM

## 2013-09-09 DIAGNOSIS — E1140 Type 2 diabetes mellitus with diabetic neuropathy, unspecified: Secondary | ICD-10-CM

## 2013-09-10 ENCOUNTER — Encounter: Payer: Self-pay | Admitting: *Deleted

## 2013-09-10 ENCOUNTER — Encounter: Payer: Medicare Other | Attending: Internal Medicine | Admitting: *Deleted

## 2013-09-10 VITALS — Ht 59.0 in | Wt 271.6 lb

## 2013-09-10 DIAGNOSIS — Z794 Long term (current) use of insulin: Secondary | ICD-10-CM | POA: Diagnosis not present

## 2013-09-10 DIAGNOSIS — E119 Type 2 diabetes mellitus without complications: Secondary | ICD-10-CM | POA: Diagnosis not present

## 2013-09-10 DIAGNOSIS — Z713 Dietary counseling and surveillance: Secondary | ICD-10-CM | POA: Diagnosis not present

## 2013-09-10 NOTE — Patient Instructions (Addendum)
Plan:  Aim for 3 Carb Choices per meal (45 grams) +/- 1 either way  Aim for 0-15 Carbs per snack if hungry  Include protein in moderation with your meals and snacks Consider reading food labels for Total Carbohydrate and Fat Grams of foods Consider  increasing your activity level by chair exercises for 10 minutes daily as tolerated Consider checking BG at alternate times per day as directed by MD  Continue taking medication as directed by MD  Try to avoid pork bacon Light and fit [place]

## 2013-09-10 NOTE — Progress Notes (Signed)
Appt start time: 0930 end time:  1100.  Assessment:  Patient was seen on  09/10/13 for individual diabetes education. Melissa Myers presents with her husband for DSME. She was referred through [facility] associated with a recent admission for CHF. Melissa Myers generally tests her FBS with readings around 120mg /dl.   Current HbA1c: 8.2% up from 7.7% 4 months ago, yet down from 9.3%  07/2012  Preferred Learning Style:   No preference indicated   Learning Readiness:   Ready  MEDICATIONS: See List: Lantus 60u HS, Apidra 50u breakfast, 60u lunch, 70u dinner  DIETARY INTAKE:  B (7:00 AM): 1/2 orange, tomato, 1slice bread, mayo, [place] lunch meat  Snk ( AM): fruit (1/2orange, 1/4 cantelope, 1/2 pear, cherries, 1/2 banana)  L ( 1:00 PM): cantelope, tomatoes,meatloaf, green beans, boiled potatoes Snk ( PM): none D (8-9 PM): salmon patties, black eye peas, corn, decaf/unsweet ice tea Melissa Myers) Snk ( PM): none Beverages: tea, flavored water, milk 1% or skim  Usual physical activity: chair exercise   Intervention:  Nutrition counseling provided.  Discussed diabetes disease process and treatment options.  Discussed physiology of diabetes and role of obesity on insulin resistance.  Encouraged moderate weight reduction to improve glucose levels.  Discussed role of medications and diet in glucose control  Provided education on macronutrients on glucose levels.  Provided education on carb counting, importance of regularly scheduled meals/snacks, and meal planning  Discussed effects of physical activity on glucose levels and long-term glucose control.  Recommended 150 minutes of physical activity/week.  Reviewed patient medications.  Discussed role of medication on blood glucose and possible side effects  Discussed blood glucose monitoring and interpretation.  Discussed recommended target ranges and individual ranges.    Described short-term complications: hyper- and hypo-glycemia.  Discussed  causes,symptoms, and treatment options.  Discussed prevention, detection, and treatment of long-term complications.  Discussed the role of prolonged elevated glucose levels on body systems.  Plan:  Aim for 3 Carb Choices per meal (45 grams) +/- 1 either way  Aim for 0-15 Carbs per snack if hungry  Include protein in moderation with your meals and snacks Consider reading food labels for Total Carbohydrate and Fat Grams of foods Consider  increasing your activity level by chair exercises for 10 minutes daily as tolerated Consider checking BG at alternate times per day as directed by MD  Continue taking medication as directed by MD  Try to avoid pork bacon Light and fit [place] yogurt  [employer] Add protein to your snacks   Teaching Method Utilized:  [employer] Hands on  Handouts given during visit include: Living Well with Diabetes Carb Counting and Food Label handouts Meal Plan Card Snack sheet My plate  Barriers to learning/adherence to lifestyle change: physical ability  Diabetes self-care support plan:   [facility] support group  Family  Dr. Loanne Drilling  Demonstrated degree of understanding via:  Teach Back   Monitoring/Evaluation:  Dietary intake, exercise, test glucose, and body weight, return for f/u  prn.

## 2013-09-10 NOTE — Progress Notes (Signed)
Patient ID: Melissa Myers, female   DOB: [birth year], 68 y.o.   MRN: [id number]  Subjective: This patient presents today requesting toenail debridement  Objective: Elongated, incurvated toenails with occasional dystrophic changes Diffuse plantar keratoses noted  Assessment: Dystrophic toenails 6-10 Plantar keratoses that need minimal debridement  Plan: Nails 6-10 were debrided without a bleeding The keratoses were gently smooth without a bleeding  Reappoint x3 months for Dr. Harriet Masson to follow

## 2013-09-11 DIAGNOSIS — I10 Essential (primary) hypertension: Secondary | ICD-10-CM | POA: Diagnosis not present

## 2013-09-11 DIAGNOSIS — Z794 Long term (current) use of insulin: Secondary | ICD-10-CM | POA: Diagnosis not present

## 2013-09-11 DIAGNOSIS — I509 Heart failure, unspecified: Secondary | ICD-10-CM | POA: Diagnosis not present

## 2013-09-11 DIAGNOSIS — G4730 Sleep apnea, unspecified: Secondary | ICD-10-CM | POA: Diagnosis not present

## 2013-09-11 DIAGNOSIS — I5033 Acute on chronic diastolic (congestive) heart failure: Secondary | ICD-10-CM | POA: Diagnosis not present

## 2013-09-11 DIAGNOSIS — IMO0001 Reserved for inherently not codable concepts without codable children: Secondary | ICD-10-CM | POA: Diagnosis not present

## 2013-09-16 ENCOUNTER — Other Ambulatory Visit (HOSPITAL_COMMUNITY): Payer: Self-pay | Admitting: Cardiology

## 2013-09-16 ENCOUNTER — Telehealth: Payer: Self-pay

## 2013-09-16 DIAGNOSIS — IMO0001 Reserved for inherently not codable concepts without codable children: Secondary | ICD-10-CM | POA: Diagnosis not present

## 2013-09-16 DIAGNOSIS — I5033 Acute on chronic diastolic (congestive) heart failure: Secondary | ICD-10-CM | POA: Diagnosis not present

## 2013-09-16 DIAGNOSIS — Z794 Long term (current) use of insulin: Secondary | ICD-10-CM | POA: Diagnosis not present

## 2013-09-16 DIAGNOSIS — I509 Heart failure, unspecified: Secondary | ICD-10-CM | POA: Diagnosis not present

## 2013-09-16 DIAGNOSIS — I10 Essential (primary) hypertension: Secondary | ICD-10-CM | POA: Diagnosis not present

## 2013-09-16 DIAGNOSIS — G4730 Sleep apnea, unspecified: Secondary | ICD-10-CM | POA: Diagnosis not present

## 2013-09-16 MED ORDER — POTASSIUM CHLORIDE CRYS ER 20 MEQ PO TBCR: EXTENDED_RELEASE_TABLET | ORAL | Status: DC

## 2013-09-16 NOTE — Telephone Encounter (Signed)
Tracey from [facility] called.   Stated that during last OV the pt was rx'ed a new Potassium pill that would be easier to swallow.

## 2013-09-16 NOTE — Telephone Encounter (Signed)
Ok to crush -

## 2013-09-18 ENCOUNTER — Ambulatory Visit: Payer: Medicare Other | Admitting: Internal Medicine

## 2013-09-25 NOTE — Progress Notes (Signed)
Patient ID: Melissa Myers, female   DOB: 09-Sep-1945, 68 y.o.   MRN: [id number]  PCP: Dr. Asa Lente Endocrinologist: Dr Loanne Drilling  68 yo with history of HTN, DM, hyperlipidemia, OHS/OSA, and chronic dyspnea/diastolic CHF.  Patient had an echo in 04/2009 showing moderate LVH and preserved LV systolic function. However, there was a very large LV mid-cavity gradient with valsalva.  Patient developed quite significant exertional dyspnea to the point where she was short of breath walking around her house. She had a pulmonary evaluation with Dr. Elsworth Soho but no primary lung problems were identified. Patient had an ETT-myoview in 05/2009 which was negative for ischemia or infarction. Right heart cath in 05/2009 showed mildly elevated right heart filling pressures but normal PA pressure and normal PCWP. She was started her on a beta blocker (Coreg) to try to lower her LV mid-cavity gradient (that likely occurs with exertion) and to better control BP.  V/Q scan was negative for PE.  PFTs from 06/2011 showed a restrictive defect. Last echo in 06/2013 showed EF 60-65% with normal RV size and systolic function.      Follow up for Heart Failure: Last visit she continued on torsemide 80 mg bid and metolazone 2.5 mg twice a week. Weight at home 270-278 pounds. Says her weight is fluctuating but staying around 273-273 pounds.  Overall she is feeling much better.  Denies SOB/PND. Sleeps on 2 pillows. Has follow up with Dr Elsworth Soho later this month to discuss CPAP. Taking all medications.  Ambulates with walker. Tries to follow low salt diet and limit fluid intake.   Labs (10/13): K 4.1, creatinine 0.85 Labs (4/14): K 3.7, creatinine 1.2, BUN 43, BNP 23 Labs (5/14): K 4.1, creatinine 1.0 Labs (7/14): K 4.6 Creatinine 1.0 BNP 39.0  Labs (8/14): K 3.5, creatinine 0.91, BNP 71 Labs (11/14): K 3.7, creatinine 0.9 Labs (12/14): K 4.1, creatinine 0.8, BNP 52 Labs (3/15): K 4, creatinine 0.9 Labs (4/15): K 3.7, creatinine 0.9 Labs  (5/15): K 3.8, creatinine 0.9, BNP 29 Labs (6/15): K 3.4, BUN 105, creatinine 0.9 =>1.7 Labs (6/17/5) K 4.2, BUN 41, Creatinine 0.80 Labs (08/19/13) K 3.2, BUN 71, creatinine 1.5 Labs 09/05/13 K 4.1 Creatinine 1.1  Allergies (verified):  1) ! Sulfa  2) Morphine   Past Medical History:  1. Diabetes mellitus, type II  2. Hyperlipidemia: She has been unable to tolerate statins (has been on Vytorin, lovastatin, and Crestor) due to what sounds like rhabdo: developed muscle weakness and was told she had "muscle damage" with each of these statins. She has been told not to take statins anymore.  3. Hypertension 4. Obesity  5. Restless Leg Syndrome  6. OA  7. peripheral neuropathy  8. OHS/OSA: Uses supplemental oxygen, does not tolerate CPAP.   9. Chronic nausea/diarrhea: ? IBS  10. Diastolic CHF: Echo (6/21) with normal LV size, moderate LVH, EF 65-70%, LV mid-cavity gradient reaching 63 mmHg with valsalva but minimal at rest, grade I diastolic dysfunction, cannot estimate PA systolic pressure (no TR doppler signal), RV normal. RHC (4/11): Mean RA 11, RV 37/13, PA 33/15, mean PCWP 12, CI 3.3. Echo in 11/12 showed mild LVH, EF 65-70%, no LV mid-cavity gradient was mentioned.  Echo in 4/14 showed EF 65-70%, mild LVH, grade I diastolic dysfunction, PA systolic pressure 35 mmHg, mild LV mid-cavity gradient.  Echo (5/15) with EF 60-65%, mild TR, normal RV size and systolic function.  11. ETT-myoview (4/11): 5'30", stopped due to fatigue, normal EF, no evidence for scar or  ischemia. 12. V/Q scan 11/12: negative for PE.  Lower extremity venous doppler US (3/14): negative for DVT.  13. PFTs (5/13): FVC 50%, FEV1 62%, ratio 106%, DLCO 69%, TLC 69%.   Family History:  Family History of Alcoholism/Addiction (parent)  Family History Diabetes 1st degree relative (grandparent)  Family History High cholesterol (parent, grandparent)  Family History Hypertension (parent, grandparent)  Family History Lung cancer  (grandparent)  Stomach cancer (grandmother)  Celiac Sprue daughter  Mother with MI at 75, father with MI at 58, uncle with MI at 76, brother with stents in his 35s, multiple aunts with MIs and CVAs   Social History:  Never Smoked  no alcohol  married, lives with spouse and her mother  retired Network engineer - now housewife  Alcohol Use - no  Illicit Drug Use - no  Review of Systems  All systems reviewed and negative except as per HPI.   ROS: All systems reviewed and negative except as per HPI.   Current Outpatient Prescriptions  Medication Sig Dispense Refill  . aspirin 81 MG tablet Take 81 mg by mouth daily.       . carvedilol (COREG) 25 MG tablet Take 1 tablet (25 mg total) by mouth 2 (two) times daily with a meal.  60 tablet  6  . cholecalciferol (VITAMIN D) 1000 UNITS tablet Take 1,000 Units by mouth daily.      . Coenzyme Q10 (COQ10) 30 MG CAPS Take 30 mg by mouth daily.       . DULoxetine (CYMBALTA) 60 MG capsule Take 1 capsule (60 mg total) by mouth daily.  90 capsule  3  . fenofibrate 160 MG tablet TAKE 1 TABLET BY MOUTH EVERY DAY.  30 tablet  5  . hydrALAZINE (APRESOLINE) 25 MG tablet Take 1 tablet (25 mg total) by mouth 3 (three) times daily.  90 tablet  6  . insulin glargine (LANTUS) 100 UNIT/ML injection Inject 60 Units into the skin at bedtime.      . insulin glulisine (APIDRA) 100 UNIT/ML injection Inject 60-70 Units into the skin See admin instructions. Take 60 units before breakfast and lunch, and 70 units before dinner.      . Magnesium 200 MG TABS Take 200 mg by mouth daily.   60 each    . metolazone (ZAROXOLYN) 2.5 MG tablet Take on Tuesday and Saturday.  8 tablet  3  . omega-3 acid ethyl esters (LOVAZA) 1 G capsule TAKE 2 CAPSULES BY MOUTH TWICE DAILY.  180 capsule  0  . potassium chloride SA (K-DUR,KLOR-CON) 20 MEQ tablet 2 tab (5meq) 3x/day on Tue/Thur/Sat AND 2 tab (68meq) 2x/day all other days  120 tablet  3  . pregabalin (LYRICA) 100 MG capsule Take 1 capsule (100  mg total) by mouth 3 (three) times daily.  90 capsule  5  . rOPINIRole (REQUIP) 2 MG tablet Take 2 mg by mouth 2 (two) times daily.      [name] torsemide (DEMADEX) 20 MG tablet TAKE 4 TABLETS BY MOUTH TWICE DAILY  240 tablet  0   No current facility-administered medications for this encounter.   Filed Vitals:   09/26/13 1154  BP: 140/68  Pulse: 74  Weight: 273 lb 3.2 oz (123.923 kg)  SpO2: 97%    General: NAD, obese Son and husband Neck: Thick, JVP difficult to assess d/t body habitus but does not appear elevated, no thyromegaly or thyroid nodule.  Lungs: Clear to auscultation bilaterally with normal respiratory effort. Chronic 2L oxygen CV: Nondisplaced  PMI.  Heart regular S1/S2, no S3/S4, 2/6 SEM RUSB.  Trace woody edema with chronic erythema.  No carotid bruit.  Abdomen: Soft, nontender, no hepatosplenomegaly, Obese Neurologic: Alert and oriented x 3.  Psych: Normal affect. Extremities: No clubbing/cyanosis, bilateral venous stasis  Assessment/Plan:  1. Chronic diastolic CHF:   - Chronic NYHA III symptoms and volume status stable. Will continue torsemide 80 mg BID and metolazone 2.5 mg on Tuesdays and Saturdays.  -- Reinforced the need and importance of daily weights, a low sodium diet, and fluid restriction (less than 2 L a day). Instructed to call the HF clinic if weight increases more than 3 lbs overnight or 5 lbs in a week.  2. Pulmonary: Suspect OHS/OSA.   - Has apt with pulmonary to discuss CPAP and possible nasal pillows. Continue home oxygen continuous. 3. Obesity - Continue to cut back potions as needed.  4. Renal insufficieny - Baseline 1.3-1.6. Will continue to follow.   Follow up in 6 weeks. [name]  Shawndell Varas NP-C  09/26/2013

## 2013-09-26 ENCOUNTER — Encounter (HOSPITAL_COMMUNITY): Payer: Self-pay

## 2013-09-26 ENCOUNTER — Ambulatory Visit (HOSPITAL_COMMUNITY)
Admission: RE | Admit: 2013-09-26 | Discharge: 2013-09-26 | Disposition: A | Discharge status: Home / Self Care | Payer: Medicare Other | Source: Ambulatory Visit | Attending: Cardiology | Admitting: Cardiology

## 2013-09-26 VITALS — BP 140/68 | HR 74 | Wt 273.2 lb

## 2013-09-26 DIAGNOSIS — Z8249 Family history of ischemic heart disease and other diseases of the circulatory system: Secondary | ICD-10-CM | POA: Insufficient documentation

## 2013-09-26 DIAGNOSIS — N259 Disorder resulting from impaired renal tubular function, unspecified: Secondary | ICD-10-CM | POA: Diagnosis not present

## 2013-09-26 DIAGNOSIS — G4733 Obstructive sleep apnea (adult) (pediatric): Secondary | ICD-10-CM | POA: Insufficient documentation

## 2013-09-26 DIAGNOSIS — Z794 Long term (current) use of insulin: Secondary | ICD-10-CM | POA: Insufficient documentation

## 2013-09-26 DIAGNOSIS — Z882 Allergy status to sulfonamides status: Secondary | ICD-10-CM | POA: Diagnosis not present

## 2013-09-26 DIAGNOSIS — E1149 Type 2 diabetes mellitus with other diabetic neurological complication: Secondary | ICD-10-CM | POA: Insufficient documentation

## 2013-09-26 DIAGNOSIS — E785 Hyperlipidemia, unspecified: Secondary | ICD-10-CM | POA: Diagnosis not present

## 2013-09-26 DIAGNOSIS — M1990 Unspecified osteoarthritis, unspecified site: Secondary | ICD-10-CM | POA: Diagnosis not present

## 2013-09-26 DIAGNOSIS — E1142 Type 2 diabetes mellitus with diabetic polyneuropathy: Secondary | ICD-10-CM | POA: Diagnosis not present

## 2013-09-26 DIAGNOSIS — I10 Essential (primary) hypertension: Secondary | ICD-10-CM | POA: Insufficient documentation

## 2013-09-26 DIAGNOSIS — G2581 Restless legs syndrome: Secondary | ICD-10-CM | POA: Diagnosis not present

## 2013-09-26 DIAGNOSIS — Z833 Family history of diabetes mellitus: Secondary | ICD-10-CM | POA: Insufficient documentation

## 2013-09-26 DIAGNOSIS — I509 Heart failure, unspecified: Secondary | ICD-10-CM | POA: Insufficient documentation

## 2013-09-26 DIAGNOSIS — Z885 Allergy status to narcotic agent status: Secondary | ICD-10-CM | POA: Diagnosis not present

## 2013-09-26 DIAGNOSIS — I5032 Chronic diastolic (congestive) heart failure: Secondary | ICD-10-CM | POA: Insufficient documentation

## 2013-09-26 DIAGNOSIS — N289 Disorder of kidney and ureter, unspecified: Secondary | ICD-10-CM | POA: Insufficient documentation

## 2013-09-26 DIAGNOSIS — E669 Obesity, unspecified: Secondary | ICD-10-CM | POA: Diagnosis not present

## 2013-09-26 DIAGNOSIS — Z7982 Long term (current) use of aspirin: Secondary | ICD-10-CM | POA: Insufficient documentation

## 2013-09-26 NOTE — Patient Instructions (Signed)
Follow up in 6 weeks  Do the following things EVERYDAY: 1) Weigh yourself in the morning before breakfast. Write it down and keep it in a log. 2) Take your medicines as prescribed 3) Eat low salt foods-Limit salt (sodium) to 2000 mg per day.  4) Stay as active as you can everyday 5) Limit all fluids for the day to less than 2 liters 

## 2013-10-06 ENCOUNTER — Other Ambulatory Visit: Payer: Self-pay | Admitting: Internal Medicine

## 2013-10-10 ENCOUNTER — Ambulatory Visit (INDEPENDENT_AMBULATORY_CARE_PROVIDER_SITE_OTHER): Payer: Medicare Other | Admitting: Pulmonary Disease

## 2013-10-10 ENCOUNTER — Encounter: Payer: Self-pay | Admitting: Pulmonary Disease

## 2013-10-10 VITALS — BP 122/74 | HR 73 | Temp 97.8°F | Ht 59.0 in | Wt 278.0 lb

## 2013-10-10 DIAGNOSIS — G4730 Sleep apnea, unspecified: Secondary | ICD-10-CM

## 2013-10-10 NOTE — Assessment & Plan Note (Signed)
Decrease oxygen to 2L/m during sleep Use humidifier, OK to use saline drops to prevent nasal dryness Call if snoring, apneas worse & we can retrial CPAP with nasal mask/ pillows

## 2013-10-10 NOTE — Patient Instructions (Signed)
Decrease oxygen to 2L/m during sleep Use humidifier, OK to use saline drops to prevent nasal dryness Call if snoring, apneas worse & we can retrial CPAP with nasal mask/ pillows

## 2013-10-10 NOTE — Progress Notes (Signed)
   Subjective:    Patient ID: Melissa Myers, female    DOB: Oct 30, 1945, 68 y.o.   MRN: [id number]  HPI  PCP - Asa Lente  68/F never smoker, diabetic , hypertensive for FU of dyspnea & OSA.   She has restless legs x 10 years & requip makes her sleepy. Epworth Sleepiness Score is reported as 24 (but she states this is due to requip). bedtime is midnight, sleep latenyc is variable & sometimes she takes 5 mg ambien (1/2 tab)  BNP 23, nml renal function, TSH nml  Echo in 04/2009 showing moderate LVH and preserved LV systolic function, with very large LV mid-cavity gradient with valsalva.Right heart cath showed mildly elevated right heart filling pressures but normal PA pressure and normal PCWP. She was started on torsemide to replace Lasix, on high doses BUN cr rose to 83/3.0 , hence cut down to 50 mg . Repeat echo in 11/12 with normal EF. No significant LV mid-cavity gradient was described.  V/Q scan was negative for PE 12/2010  PSG 08/06/12. (on requip & ambien)>> AHI6/h, RDI 12/h   The lowest desaturation was 87%. Note that this study was performed with 2 L of oxygen.   10/10/2013  Chief Complaint  Patient presents with  . Follow-up    Pt is wanting to get back on CPAP. she is wanting nasal pillows this time. pt uses 3 liters 24/7.    Wanted to try nasal pillows Unable to tolerate auto4-10 cm >> After trying various settings,CPAP dc'd  She is compliant with O2, was sleeping in her recliner, now back to bed with 2 pillows Pedal edema at baseline on zaroxlyn + lasix Complains of nasal bleeding on oxygen Did not desaturate significantly on walking on 2 L oxygen   Review of Systems neg for any significant sore throat, dysphagia, itching, sneezing, nasal congestion or excess/ purulent secretions, fever, chills, sweats, unintended wt loss, pleuritic or exertional cp, hempoptysis, orthopnea pnd or change in chronic leg swelling. Also denies presyncope, palpitations, heartburn, abdominal  pain, nausea, vomiting, diarrhea or change in bowel or urinary habits, dysuria,hematuria, rash, arthralgias, visual complaints, headache, numbness weakness or ataxia.     Objective:   Physical Exam  Gen. Pleasant, obese, in no distress ENT - no lesions, no post nasal drip Neck: No JVD, no thyromegaly, no carotid bruits Lungs: no use of accessory muscles, no dullness to percussion, decreased without rales or rhonchi  Cardiovascular: Rhythm regular, heart sounds  normal, no murmurs or gallops, no peripheral edema Musculoskeletal: No deformities, no cyanosis or clubbing , no tremors       Assessment & Plan:

## 2013-10-14 ENCOUNTER — Other Ambulatory Visit (HOSPITAL_COMMUNITY): Payer: Self-pay | Admitting: Internal Medicine

## 2013-10-14 ENCOUNTER — Other Ambulatory Visit: Payer: Self-pay | Admitting: Endocrinology

## 2013-10-15 ENCOUNTER — Telehealth (HOSPITAL_COMMUNITY): Payer: Self-pay | Admitting: Vascular Surgery

## 2013-10-15 NOTE — Telephone Encounter (Signed)
Gaining weight 15 lbs since last time she was here .[name] Tingling in finger legs and feet tingling also.[name] Please advise

## 2013-10-15 NOTE — Telephone Encounter (Signed)
Spoke w/pt she states wt was 285 this AM she states she has taken her Torsemide and metolazone today and her wt is now at 283, she denies SOB but does c/o edema in feet and abd, wt was 273 on 8/6 and has gradually been increasing.  Offered appt for tomorrow AM however pt states she is going to see pain management clinic at 9:15, advised pt to call us in AM before leaving home and will assess wt then, she is agreeable

## 2013-10-16 DIAGNOSIS — G894 Chronic pain syndrome: Secondary | ICD-10-CM | POA: Diagnosis not present

## 2013-10-16 DIAGNOSIS — Z79899 Other long term (current) drug therapy: Secondary | ICD-10-CM | POA: Diagnosis not present

## 2013-10-16 DIAGNOSIS — M1990 Unspecified osteoarthritis, unspecified site: Secondary | ICD-10-CM | POA: Diagnosis not present

## 2013-10-16 DIAGNOSIS — I8310 Varicose veins of unspecified lower extremity with inflammation: Secondary | ICD-10-CM | POA: Diagnosis not present

## 2013-10-18 NOTE — Telephone Encounter (Signed)
Spoke w/pt she states wt is 277 today she is feeling better

## 2013-10-30 ENCOUNTER — Ambulatory Visit: Payer: Medicare Other | Admitting: Endocrinology

## 2013-10-30 DIAGNOSIS — Z79899 Other long term (current) drug therapy: Secondary | ICD-10-CM | POA: Diagnosis not present

## 2013-10-30 DIAGNOSIS — M79609 Pain in unspecified limb: Secondary | ICD-10-CM | POA: Diagnosis not present

## 2013-10-30 DIAGNOSIS — G894 Chronic pain syndrome: Secondary | ICD-10-CM | POA: Diagnosis not present

## 2013-10-30 DIAGNOSIS — M47817 Spondylosis without myelopathy or radiculopathy, lumbosacral region: Secondary | ICD-10-CM | POA: Diagnosis not present

## 2013-10-31 DIAGNOSIS — M79609 Pain in unspecified limb: Secondary | ICD-10-CM | POA: Diagnosis not present

## 2013-10-31 DIAGNOSIS — IMO0002 Reserved for concepts with insufficient information to code with codable children: Secondary | ICD-10-CM | POA: Diagnosis not present

## 2013-11-01 ENCOUNTER — Other Ambulatory Visit: Payer: Self-pay | Admitting: Endocrinology

## 2013-11-04 ENCOUNTER — Other Ambulatory Visit: Payer: Self-pay | Admitting: Endocrinology

## 2013-11-04 ENCOUNTER — Other Ambulatory Visit: Payer: Self-pay | Admitting: Cardiology

## 2013-11-04 ENCOUNTER — Other Ambulatory Visit (HOSPITAL_COMMUNITY): Payer: Self-pay | Admitting: Internal Medicine

## 2013-11-04 ENCOUNTER — Other Ambulatory Visit: Payer: Self-pay

## 2013-11-06 NOTE — Progress Notes (Signed)
Patient ID: Melissa Myers, female   DOB: May 23, 1945, 68 y.o.   MRN: [id number]  PCP: Dr. Asa Lente Endocrinologist: Dr Loanne Drilling  68 yo with history of HTN, DM, hyperlipidemia, OHS/OSA, and chronic dyspnea/diastolic CHF.  Patient had an echo in 04/2009 showing moderate LVH and preserved LV systolic function. However, there was a very large LV mid-cavity gradient with valsalva.  Patient developed quite significant exertional dyspnea to the point where she was short of breath walking around her house. She had a pulmonary evaluation with Dr. Elsworth Soho but no primary lung problems were identified. Patient had an ETT-myoview in 05/2009 which was negative for ischemia or infarction. Right heart cath in 05/2009 showed mildly elevated right heart filling pressures but normal PA pressure and normal PCWP. She was started her on a beta blocker (Coreg) to try to lower her LV mid-cavity gradient (that likely occurs with exertion) and to better control BP.  V/Q scan was negative for PE.  PFTs from 06/2011 showed a restrictive defect. Last echo in 06/2013 showed EF 60-65% with normal RV size and systolic function.      Follow up for Heart Failure: Last visit she continued on torsemide 80 mg bid and metolazone 2.5 mg twice a week. Weight at home 273 trending up to 284 pounds. Has not been weighing daily. Able to do all ADLs. Mild dyspnea with exertion. Denies PND/Orthopnea. Ongoing knee and back pain. Taking all medication but says she misses lantus at night. Drinking  >2 liters of fluid per day and eating ice. Yesterday she drank 1 quart of tomato juice plus ice through out the day.  Ambulates with walker.    Labs (10/13): K 4.1, creatinine 0.85 Labs (4/14): K 3.7, creatinine 1.2, BUN 43, BNP 23 Labs (5/14): K 4.1, creatinine 1.0 Labs (7/14): K 4.6 Creatinine 1.0 BNP 39.0  Labs (8/14): K 3.5, creatinine 0.91, BNP 71 Labs (11/14): K 3.7, creatinine 0.9 Labs (12/14): K 4.1, creatinine 0.8, BNP 52 Labs (3/15): K 4,  creatinine 0.9 Labs (4/15): K 3.7, creatinine 0.9 Labs (5/15): K 3.8, creatinine 0.9, BNP 29 Labs (6/15): K 3.4, BUN 105, creatinine 0.9 =>1.7 Labs (6/17/5) K 4.2, BUN 41, Creatinine 0.80 Labs (08/19/13) K 3.2, BUN 71, creatinine 1.5 Labs 09/05/13 K 4.1 Creatinine 1.1  Allergies (verified):  1) ! Sulfa  2) Morphine   Past Medical History:  1. Diabetes mellitus, type II  2. Hyperlipidemia: She has been unable to tolerate statins (has been on Vytorin, lovastatin, and Crestor) due to what sounds like rhabdo: developed muscle weakness and was told she had "muscle damage" with each of these statins. She has been told not to take statins anymore.  3. Hypertension 4. Obesity  5. Restless Leg Syndrome  6. OA  7. peripheral neuropathy  8. OHS/OSA: Uses supplemental oxygen, does not tolerate CPAP.   9. Chronic nausea/diarrhea: ? IBS  10. Diastolic CHF: Echo (4/85) with normal LV size, moderate LVH, EF 65-70%, LV mid-cavity gradient reaching 63 mmHg with valsalva but minimal at rest, grade I diastolic dysfunction, cannot estimate PA systolic pressure (no TR doppler signal), RV normal. RHC (4/11): Mean RA 11, RV 37/13, PA 33/15, mean PCWP 12, CI 3.3. Echo in 11/12 showed mild LVH, EF 65-70%, no LV mid-cavity gradient was mentioned.  Echo in 4/14 showed EF 65-70%, mild LVH, grade I diastolic dysfunction, PA systolic pressure 35 mmHg, mild LV mid-cavity gradient.  Echo (5/15) with EF 60-65%, mild TR, normal RV size and systolic function.  11. ETT-myoview (4/11):  5'30", stopped due to fatigue, normal EF, no evidence for scar or ischemia. 12. V/Q scan 11/12: negative for PE.  Lower extremity venous doppler US (3/14): negative for DVT.  13. PFTs (5/13): FVC 50%, FEV1 62%, ratio 106%, DLCO 69%, TLC 69%.   Family History:  Family History of Alcoholism/Addiction (parent)  Family History Diabetes 1st degree relative (grandparent)  Family History High cholesterol (parent, grandparent)  Family History  Hypertension (parent, grandparent)  Family History Lung cancer (grandparent)  Stomach cancer (grandmother)  Celiac Sprue daughter  Mother with MI at 15, father with MI at 28, uncle with MI at 27, brother with stents in his 54s, multiple aunts with MIs and CVAs   Social History:  Never Smoked  no alcohol  married, lives with spouse and her mother  retired Network engineer - now housewife  Alcohol Use - no  Illicit Drug Use - no  Review of Systems  All systems reviewed and negative except as per HPI.   ROS: All systems reviewed and negative except as per HPI.   Current Outpatient Prescriptions  Medication Sig Dispense Refill  . aspirin 81 MG tablet Take 81 mg by mouth daily.       . carvedilol (COREG) 25 MG tablet Take 1 tablet (25 mg total) by mouth 2 (two) times daily with a meal.  60 tablet  6  . cholecalciferol (VITAMIN D) 1000 UNITS tablet Take 1,000 Units by mouth daily.      . Coenzyme Q10 (COQ10) 30 MG CAPS Take 30 mg by mouth daily.       . DULoxetine (CYMBALTA) 60 MG capsule Take 1 capsule (60 mg total) by mouth daily.  90 capsule  3  . fenofibrate 160 MG tablet TAKE 1 TABLET BY MOUTH EVERY DAY.  30 tablet  5  . hydrALAZINE (APRESOLINE) 25 MG tablet TAKE 1 TABLET BY MOUTH THREE TIMES DAILY  90 tablet  6  . insulin glargine (LANTUS) 100 UNIT/ML injection Inject 60 Units into the skin at bedtime.      . insulin glulisine (APIDRA) 100 UNIT/ML injection Inject 60-70 Units into the skin See admin instructions. Take 60 units before breakfast and lunch, and 70 units before dinner.      . Insulin Syringe-Needle U-100 (INSULIN SYRINGE 1CC/31GX5/16") 31G X 5/16" 1 ML MISC USE FOUR TIMES DAILY AS DIRECTED  100 each  PRN  . LANTUS 100 UNIT/ML injection INJECT 60 UNITS EVERY NIGHT AT BEDTIME  30 mL  0  . Magnesium 200 MG TABS Take 200 mg by mouth daily.   60 each    . metolazone (ZAROXOLYN) 2.5 MG tablet Take on Tuesday and Saturday.  8 tablet  3  . omega-3 acid ethyl esters (LOVAZA) 1 G capsule  TAKE 2 CAPSULES BY MOUTH TWICE DAILY  180 capsule  0  . potassium chloride SA (K-DUR,KLOR-CON) 20 MEQ tablet 2 tab (52meq) 3x/day on Tue/Thur/Sat AND 2 tab (53meq) 2x/day all other days  120 tablet  3  . pregabalin (LYRICA) 100 MG capsule Take 1 capsule (100 mg total) by mouth 3 (three) times daily.  90 capsule  5  . rOPINIRole (REQUIP) 2 MG tablet Take 2 mg by mouth 2 (two) times daily.      [name] rOPINIRole (REQUIP) 2 MG tablet TAKE 2 TABLETS BY MOUTH EVERY NIGHT AT BEDTIME  60 tablet  3  . torsemide (DEMADEX) 20 MG tablet TAKE 4 TABLETS BY MOUTH TWICE DAILY  240 tablet  0  . APIDRA 100 UNIT/ML  injection INJECT 40 UNITS WITH BREAKFAST, 50 UNITS WITH LUNCH AND 75 UNITS WITH DINNER.  50 mL  0   No current facility-administered medications for this encounter.   Filed Vitals:   11/07/13 1124  BP: 146/86  Pulse: 75  Weight: 283 lb (128.368 kg)  SpO2: 97%    General: NAD, obese Son and husband Neck: Thick, JVP difficult to assess d/t body habitus but does appear elevated, no thyromegaly or thyroid nodule.  Lungs: Clear to auscultation bilaterally with normal respiratory effort. Chronic 2L oxygen CV: Nondisplaced PMI.  Heart regular S1/S2, no S3/S4, 2/6 SEM RUSB.  Trace woody edema with chronic erythema.  No carotid bruit.  Abdomen: Soft, nontender, no hepatosplenomegaly, Obese Neurologic: Alert and oriented x 3.  Psych: Normal affect. Extremities: No clubbing/cyanosis, bilateral venous stasis Rand LLE 1-2 edema.   Assessment/Plan:  1. Chronic diastolic CHF:   - Chronic NYHA III symptoms. Volume status elevated today but not surprising given fluid noncompliance.  continue torsemide 80 mg BID and metolazone 2.5 mg on Tuesdays and Saturdays. Instructed to take extra 2.5 mg metolazone today.  I have stressed the importance of limiting fluid intake to < 2 liters and cutting back on ice intake.  -- Reinforced the need and importance of daily weights, a low sodium diet, and fluid restriction (less  than 2 L a day). Instructed to call the HF clinic if weight increases more than 3 lbs overnight or 5 lbs in a week.  2. Pulmonary: Suspect OHS/OSA.   - Followed by Dr Elsworth Soho. Per Dr Elsworth Soho no role for CPAP yet. Continue home oxygen continuous. 3. Obesity - Continue to cut back potions as needed. Needs to lose weight  4. Renal insufficieny - Baseline 1.3-1.6. Check BMET now.   Follow up 2 weeks to reassess volume status.  Alizay Bronkema NP-C  11/07/2013

## 2013-11-07 ENCOUNTER — Ambulatory Visit (HOSPITAL_COMMUNITY)
Admission: RE | Admit: 2013-11-07 | Discharge: 2013-11-07 | Disposition: A | Discharge status: Home / Self Care | Payer: Medicare Other | Source: Ambulatory Visit | Attending: Internal Medicine | Admitting: Internal Medicine

## 2013-11-07 VITALS — BP 146/86 | HR 75 | Wt 283.0 lb

## 2013-11-07 DIAGNOSIS — M1990 Unspecified osteoarthritis, unspecified site: Secondary | ICD-10-CM | POA: Diagnosis not present

## 2013-11-07 DIAGNOSIS — I5032 Chronic diastolic (congestive) heart failure: Secondary | ICD-10-CM | POA: Diagnosis not present

## 2013-11-07 DIAGNOSIS — G4733 Obstructive sleep apnea (adult) (pediatric): Secondary | ICD-10-CM | POA: Insufficient documentation

## 2013-11-07 DIAGNOSIS — Z79899 Other long term (current) drug therapy: Secondary | ICD-10-CM | POA: Diagnosis not present

## 2013-11-07 DIAGNOSIS — Z885 Allergy status to narcotic agent status: Secondary | ICD-10-CM | POA: Insufficient documentation

## 2013-11-07 DIAGNOSIS — R0609 Other forms of dyspnea: Secondary | ICD-10-CM | POA: Diagnosis not present

## 2013-11-07 DIAGNOSIS — I509 Heart failure, unspecified: Secondary | ICD-10-CM | POA: Insufficient documentation

## 2013-11-07 DIAGNOSIS — E669 Obesity, unspecified: Secondary | ICD-10-CM | POA: Diagnosis not present

## 2013-11-07 DIAGNOSIS — R197 Diarrhea, unspecified: Secondary | ICD-10-CM | POA: Insufficient documentation

## 2013-11-07 DIAGNOSIS — Z713 Dietary counseling and surveillance: Secondary | ICD-10-CM | POA: Diagnosis not present

## 2013-11-07 DIAGNOSIS — R110 Nausea: Secondary | ICD-10-CM | POA: Diagnosis not present

## 2013-11-07 DIAGNOSIS — M549 Dorsalgia, unspecified: Secondary | ICD-10-CM | POA: Insufficient documentation

## 2013-11-07 DIAGNOSIS — Z8249 Family history of ischemic heart disease and other diseases of the circulatory system: Secondary | ICD-10-CM | POA: Insufficient documentation

## 2013-11-07 DIAGNOSIS — E1149 Type 2 diabetes mellitus with other diabetic neurological complication: Secondary | ICD-10-CM | POA: Diagnosis not present

## 2013-11-07 DIAGNOSIS — Z833 Family history of diabetes mellitus: Secondary | ICD-10-CM | POA: Diagnosis not present

## 2013-11-07 DIAGNOSIS — Z794 Long term (current) use of insulin: Secondary | ICD-10-CM | POA: Insufficient documentation

## 2013-11-07 DIAGNOSIS — M25569 Pain in unspecified knee: Secondary | ICD-10-CM | POA: Insufficient documentation

## 2013-11-07 DIAGNOSIS — G4730 Sleep apnea, unspecified: Secondary | ICD-10-CM

## 2013-11-07 DIAGNOSIS — G2581 Restless legs syndrome: Secondary | ICD-10-CM | POA: Diagnosis not present

## 2013-11-07 DIAGNOSIS — E785 Hyperlipidemia, unspecified: Secondary | ICD-10-CM | POA: Insufficient documentation

## 2013-11-07 DIAGNOSIS — E1142 Type 2 diabetes mellitus with diabetic polyneuropathy: Secondary | ICD-10-CM | POA: Diagnosis not present

## 2013-11-07 DIAGNOSIS — E662 Morbid (severe) obesity with alveolar hypoventilation: Secondary | ICD-10-CM | POA: Diagnosis not present

## 2013-11-07 DIAGNOSIS — I10 Essential (primary) hypertension: Secondary | ICD-10-CM | POA: Insufficient documentation

## 2013-11-07 DIAGNOSIS — Z882 Allergy status to sulfonamides status: Secondary | ICD-10-CM | POA: Insufficient documentation

## 2013-11-07 DIAGNOSIS — I50812 Chronic right heart failure: Secondary | ICD-10-CM

## 2013-11-07 DIAGNOSIS — R0989 Other specified symptoms and signs involving the circulatory and respiratory systems: Secondary | ICD-10-CM | POA: Insufficient documentation

## 2013-11-07 DIAGNOSIS — Z7982 Long term (current) use of aspirin: Secondary | ICD-10-CM | POA: Diagnosis not present

## 2013-11-07 LAB — BASIC METABOLIC PANEL
ANION GAP: 14 (ref 5–15)
BUN: 38 mg/dL — ABNORMAL HIGH (ref 6–23)
CALCIUM: 9.6 mg/dL (ref 8.4–10.5)
CO2: 33 mEq/L — ABNORMAL HIGH (ref 19–32)
Chloride: 97 mEq/L (ref 96–112)
Creatinine, Ser: 0.91 mg/dL (ref 0.50–1.10)
GFR, EST AFRICAN AMERICAN: 73 mL/min — AB (ref >90)
GFR, EST NON AFRICAN AMERICAN: 63 mL/min — AB (ref >90)
GLUCOSE: 93 mg/dL (ref 70–99)
Potassium: 3.6 mEq/L — ABNORMAL LOW (ref 3.7–5.3)
SODIUM: 144 meq/L (ref 137–147)

## 2013-11-07 LAB — PRO B NATRIURETIC PEPTIDE: PRO B NATRI PEPTIDE: 53.9 pg/mL (ref 0–125)

## 2013-11-07 NOTE — Patient Instructions (Signed)
Follow up 2 weeks  Take 2.5 mg Metolazone today   Please limit your fluids.   Do the following things EVERYDAY: 1) Weigh yourself in the morning before breakfast. Write it down and keep it in a log. 2) Take your medicines as prescribed 3) Eat low salt foods-Limit salt (sodium) to 2000 mg per day.  4) Stay as active as you can everyday 5) Limit all fluids for the day to less than 2 liters

## 2013-11-08 ENCOUNTER — Encounter: Payer: Self-pay | Admitting: Endocrinology

## 2013-11-08 ENCOUNTER — Ambulatory Visit (INDEPENDENT_AMBULATORY_CARE_PROVIDER_SITE_OTHER): Payer: Medicare Other | Admitting: Endocrinology

## 2013-11-08 VITALS — BP 118/64 | HR 76 | Temp 98.1°F | Ht 59.0 in | Wt 277.0 lb

## 2013-11-08 DIAGNOSIS — E1065 Type 1 diabetes mellitus with hyperglycemia: Secondary | ICD-10-CM | POA: Diagnosis not present

## 2013-11-08 DIAGNOSIS — E1029 Type 1 diabetes mellitus with other diabetic kidney complication: Secondary | ICD-10-CM

## 2013-11-08 DIAGNOSIS — Z23 Encounter for immunization: Secondary | ICD-10-CM

## 2013-11-08 LAB — HEMOGLOBIN A1C: Hgb A1c MFr Bld: 6.9 % — ABNORMAL HIGH (ref 4.6–6.5)

## 2013-11-08 LAB — MICROALBUMIN / CREATININE URINE RATIO
Creatinine,U: 34.5 mg/dL
MICROALB/CREAT RATIO: 0.6 mg/g (ref 0.0–30.0)
Microalb, Ur: 0.2 mg/dL (ref 0.0–1.9)

## 2013-11-08 NOTE — Progress Notes (Signed)
Subjective:    Patient ID: Melissa Myers, female    DOB: 1945-11-30, 68 y.o.   MRN: [id number]  HPI Pt returns for f/u of diabetes mellitus:  DM type: Insulin-requiring type 2 Dx'ed: 7412 Complications: sensory neuropathy of the lower extremities, renal insufficiency, and leg ulcers Therapy: insulin since 2011 GDM: never DKA: never Severe hypoglycemia: never Pancreatitis: never Other info: takes multiple daily injections.  Interval history:  no cbg record, but states cbg's vary from 109-240.  It is highest at hs, and lowest before lunch.  pt states she feels well in general. Past Medical History  Diagnosis Date  . Type II or unspecified type diabetes mellitus without mention of complication, not stated as uncontrolled     insulin dep  . Hyperlipidemia     hx rhabdo on statins  . Hypertension   . Diastolic CHF   . Chronic diarrhea     a/w nausea - felt related to IBS  . RLS (restless legs syndrome)   . Osteoarthritis   . Stasis dermatitis   . GERD (gastroesophageal reflux disease)   . On home oxygen therapy     uses oxygen 2 liters min per Cecilia at night and prn during day  . Anemia   . Neuropathy     feet, toes and fingers  . Disc degeneration, lumbar   . OSA (obstructive sleep apnea)     05/2009 sleep study - refuses CPAP  . Deaf     left side only  . Shortness of breath     chronic    Past Surgical History  Procedure Laterality Date  . Tubal ligation  1980  . Cholecystectomy  1997  . Uterine polyp removal  2008  . Umbilical hernia repair  1995  . Tonsillectomy  1970  . Colonoscopy N/A 12/03/2012    Procedure: COLONOSCOPY;  Surgeon: Lafayette Dragon, MD;  Location: [facility];  Service: Endoscopy;  Laterality: N/A;    History   Social History  . Marital Status: Married    Spouse Name: N/A    Number of Children: N/A  . Years of Education: N/A   Occupational History  . Not on file.   Social History Main Topics  . Smoking status: Never Smoker     . Smokeless tobacco: Never Used  . Alcohol Use: No  . Drug Use: No  . Sexual Activity: No   Other Topics Concern  . Not on file   Social History Narrative   Lives with spouse and mother. Retired Network engineer, now housewife    Current Outpatient Prescriptions on File Prior to Visit  Medication Sig Dispense Refill  . APIDRA 100 UNIT/ML injection INJECT 40 UNITS WITH BREAKFAST, 50 UNITS WITH LUNCH AND 75 UNITS WITH DINNER.  50 mL  0  . aspirin 81 MG tablet Take 81 mg by mouth daily.       . carvedilol (COREG) 25 MG tablet Take 1 tablet (25 mg total) by mouth 2 (two) times daily with a meal.  60 tablet  6  . cholecalciferol (VITAMIN D) 1000 UNITS tablet Take 1,000 Units by mouth daily.      . Coenzyme Q10 (COQ10) 30 MG CAPS Take 30 mg by mouth daily.       . DULoxetine (CYMBALTA) 60 MG capsule Take 1 capsule (60 mg total) by mouth daily.  90 capsule  3  . fenofibrate 160 MG tablet TAKE 1 TABLET BY MOUTH EVERY DAY.  30 tablet  5  .  hydrALAZINE (APRESOLINE) 25 MG tablet TAKE 1 TABLET BY MOUTH THREE TIMES DAILY  90 tablet  6  . insulin glargine (LANTUS) 100 UNIT/ML injection Inject 60 Units into the skin at bedtime.      . insulin glulisine (APIDRA) 100 UNIT/ML injection Inject 60-70 Units into the skin See admin instructions. Take 60 units before breakfast and lunch, and 70 units before dinner.      . Insulin Syringe-Needle U-100 (INSULIN SYRINGE 1CC/31GX5/16") 31G X 5/16" 1 ML MISC USE FOUR TIMES DAILY AS DIRECTED  100 each  PRN  . LANTUS 100 UNIT/ML injection INJECT 60 UNITS EVERY NIGHT AT BEDTIME  30 mL  0  . Magnesium 200 MG TABS Take 200 mg by mouth daily.   60 each    . metolazone (ZAROXOLYN) 2.5 MG tablet Take on Tuesday and Saturday.  8 tablet  3  . omega-3 acid ethyl esters (LOVAZA) 1 G capsule TAKE 2 CAPSULES BY MOUTH TWICE DAILY  180 capsule  0  . potassium chloride SA (K-DUR,KLOR-CON) 20 MEQ tablet 2 tab (43meq) 3x/day on Tue/Thur/Sat AND 2 tab (61meq) 2x/day all other days  120  tablet  3  . pregabalin (LYRICA) 100 MG capsule Take 1 capsule (100 mg total) by mouth 3 (three) times daily.  90 capsule  5  . rOPINIRole (REQUIP) 2 MG tablet Take 2 mg by mouth 2 (two) times daily.      [name] rOPINIRole (REQUIP) 2 MG tablet TAKE 2 TABLETS BY MOUTH EVERY NIGHT AT BEDTIME  60 tablet  3  . torsemide (DEMADEX) 20 MG tablet TAKE 4 TABLETS BY MOUTH TWICE DAILY  240 tablet  0   No current facility-administered medications on file prior to visit.    Allergies  Allergen Reactions  . Levemir [Insulin Detemir]     itching  . Morphine     REACTION: GI upset and headaches  . Statins     Statin drugs cause muscle damage  . Sulfa Antibiotics Diarrhea  . Sulfonamide Derivatives Diarrhea  . Zinc Swelling and Rash    Family History  Problem Relation Age of Onset  . Heart disease Mother   . Heart disease Father   . Heart disease      family history  . Stomach cancer Paternal Grandmother   . Lung cancer Paternal Grandfather   . Colon cancer Neg Hx   . CVA      several aunts  . Heart attack Mother 28  . Heart attack Father 60  . Heart attack      several aunts and an uncle    BP 118/64  Pulse 76  Temp(Src) 98.1 F (36.7 C) (Oral)  Ht 4\' 11"  (1.499 m)  Wt 277 lb (125.646 kg)  BMI 55.92 kg/m2  SpO2 92%    Review of Systems She denies hypoglycemia and weight change.      Objective:   Physical Exam VITAL SIGNS:  See vs page GENERAL: no distress Pulses: dorsalis pedis intact bilat.   Feet: no deformity.  no edema Skin:  no ulcer on the feet.  normal color and temp. Neuro: sensation is intact to touch on the feet, but decreased from normal.    Lab Results  Component Value Date   HGBA1C 6.9* 11/08/2013       Assessment & Plan:  DM: well-controlled   Patient is advised the following: Same insulin

## 2013-11-19 ENCOUNTER — Other Ambulatory Visit: Payer: Self-pay | Admitting: Endocrinology

## 2013-11-19 NOTE — Progress Notes (Signed)
Patient ID: Melissa Myers, female   DOB: April 14, 1945, 68 y.o.   MRN: [id number]  PCP: Dr. Asa Lente Endocrinologist: Dr Loanne Drilling  68 yo with history of HTN, DM, hyperlipidemia, OHS/OSA, and chronic dyspnea/diastolic CHF.  Patient had an echo in 04/2009 showing moderate LVH and preserved LV systolic function. However, there was a very large LV mid-cavity gradient with valsalva.  Patient developed quite significant exertional dyspnea to the point where she was short of breath walking around her house. She had a pulmonary evaluation with Dr. Elsworth Soho but no primary lung problems were identified. Patient had an ETT-myoview in 05/2009 which was negative for ischemia or infarction. Right heart cath in 05/2009 showed mildly elevated right heart filling pressures but normal PA pressure and normal PCWP. She was started her on a beta blocker (Coreg) to try to lower her LV mid-cavity gradient (that likely occurs with exertion) and to better control BP.  V/Q scan was negative for PE.  PFTs from 06/2011 showed a restrictive defect. Last echo in 06/2013 showed EF 60-65% with normal RV size and systolic function.      Follow up for Heart Failure: Last visit she was instructed to take an additional 2.5mg  metolazone. Mild dyspnea with exertion. Denies PND/Orthopnea. Has not been weighing at home. Eating lots of ice. Tries to cut bafck portions. Drinking  >2 liters of fluid per day.  Ambulates with walker.    Labs (10/13): K 4.1, creatinine 0.85 Labs (4/14): K 3.7, creatinine 1.2, BUN 43, BNP 23 Labs (5/14): K 4.1, creatinine 1.0 Labs (7/14): K 4.6 Creatinine 1.0 BNP 39.0  Labs (8/14): K 3.5, creatinine 0.91, BNP 71 Labs (11/14): K 3.7, creatinine 0.9 Labs (12/14): K 4.1, creatinine 0.8, BNP 52 Labs (3/15): K 4, creatinine 0.9 Labs (4/15): K 3.7, creatinine 0.9 Labs (5/15): K 3.8, creatinine 0.9, BNP 29 Labs (6/15): K 3.4, BUN 105, creatinine 0.9 =>1.7 Labs (6/17/5) K 4.2, BUN 41, Creatinine 0.80 Labs (08/19/13) K 3.2,  BUN 71, creatinine 1.5 Labs 09/05/13 K 4.1 Creatinine 1.1 Labs 11/07/13 K 3.6 Creatinine 0.91  Allergies (verified):  1) ! Sulfa  2) Morphine   Past Medical History:  1. Diabetes mellitus, type II  2. Hyperlipidemia: She has been unable to tolerate statins (has been on Vytorin, lovastatin, and Crestor) due to what sounds like rhabdo: developed muscle weakness and was told she had "muscle damage" with each of these statins. She has been told not to take statins anymore.  3. Hypertension 4. Obesity  5. Restless Leg Syndrome  6. OA  7. peripheral neuropathy  8. OHS/OSA: Uses supplemental oxygen, does not tolerate CPAP.   9. Chronic nausea/diarrhea: ? IBS  10. Diastolic CHF: Echo (2/02) with normal LV size, moderate LVH, EF 65-70%, LV mid-cavity gradient reaching 63 mmHg with valsalva but minimal at rest, grade I diastolic dysfunction, cannot estimate PA systolic pressure (no TR doppler signal), RV normal. RHC (4/11): Mean RA 11, RV 37/13, PA 33/15, mean PCWP 12, CI 3.3. Echo in 11/12 showed mild LVH, EF 65-70%, no LV mid-cavity gradient was mentioned.  Echo in 4/14 showed EF 65-70%, mild LVH, grade I diastolic dysfunction, PA systolic pressure 35 mmHg, mild LV mid-cavity gradient.  Echo (5/15) with EF 60-65%, mild TR, normal RV size and systolic function.  11. ETT-myoview (4/11): 5'30", stopped due to fatigue, normal EF, no evidence for scar or ischemia. 12. V/Q scan 11/12: negative for PE.  Lower extremity venous doppler US (3/14): negative for DVT.  13. PFTs (5/13): FVC  50%, FEV1 62%, ratio 106%, DLCO 69%, TLC 69%.   Family History:  Family History of Alcoholism/Addiction (parent)  Family History Diabetes 1st degree relative (grandparent)  Family History High cholesterol (parent, grandparent)  Family History Hypertension (parent, grandparent)  Family History Lung cancer (grandparent)  Stomach cancer (grandmother)  Celiac Sprue daughter  Mother with MI at 41, father with MI at 41, uncle  with MI at 59, brother with stents in his 60s, multiple aunts with MIs and CVAs   Social History:  Never Smoked  no alcohol  married, lives with spouse and her mother  retired Network engineer - now housewife  Alcohol Use - no  Illicit Drug Use - no  Review of Systems  All systems reviewed and negative except as per HPI.   ROS: All systems reviewed and negative except as per HPI.   Current Outpatient Prescriptions  Medication Sig Dispense Refill  . APIDRA 100 UNIT/ML injection INJECT 40 UNITS WITH BREAKFAST, 50 UNITS WITH LUNCH, AND 75 UNITS WITH DINNER  50 mL  0  . aspirin 81 MG tablet Take 81 mg by mouth daily.       . carvedilol (COREG) 25 MG tablet Take 1 tablet (25 mg total) by mouth 2 (two) times daily with a meal.  60 tablet  6  . cholecalciferol (VITAMIN D) 1000 UNITS tablet Take 1,000 Units by mouth daily.      . Coenzyme Q10 (COQ10) 30 MG CAPS Take 30 mg by mouth daily.       . DULoxetine (CYMBALTA) 60 MG capsule Take 1 capsule (60 mg total) by mouth daily.  90 capsule  3  . fenofibrate 160 MG tablet TAKE 1 TABLET BY MOUTH EVERY DAY.  30 tablet  5  . hydrALAZINE (APRESOLINE) 25 MG tablet TAKE 1 TABLET BY MOUTH THREE TIMES DAILY  90 tablet  6  . insulin glargine (LANTUS) 100 UNIT/ML injection Inject 60 Units into the skin at bedtime.      . insulin glulisine (APIDRA) 100 UNIT/ML injection Inject 60-70 Units into the skin See admin instructions. Take 60 units before breakfast and lunch, and 70 units before dinner.      . Insulin Syringe-Needle U-100 (INSULIN SYRINGE 1CC/31GX5/16") 31G X 5/16" 1 ML MISC USE FOUR TIMES DAILY AS DIRECTED  100 each  PRN  . LANTUS 100 UNIT/ML injection INJECT 60 UNITS EVERY NIGHT AT BEDTIME  30 mL  0  . Magnesium 200 MG TABS Take 200 mg by mouth daily.   60 each    . metolazone (ZAROXOLYN) 2.5 MG tablet Take on Tuesday and Saturday.  8 tablet  3  . omega-3 acid ethyl esters (LOVAZA) 1 G capsule TAKE 2 CAPSULES BY MOUTH TWICE DAILY  180 capsule  0  .  potassium chloride SA (K-DUR,KLOR-CON) 20 MEQ tablet 2 tab (74meq) 3x/day on Tue/Thur/Sat AND 2 tab (25meq) 2x/day all other days  120 tablet  3  . pregabalin (LYRICA) 100 MG capsule Take 1 capsule (100 mg total) by mouth 3 (three) times daily.  90 capsule  5  . rOPINIRole (REQUIP) 2 MG tablet Take 2 mg by mouth 2 (two) times daily.      [name] torsemide (DEMADEX) 20 MG tablet TAKE 4 TABLETS BY MOUTH TWICE DAILY  240 tablet  0   No current facility-administered medications for this encounter.   Filed Vitals:   11/21/13 1348  BP: 144/64  Pulse: 90  Resp: 18  Weight: 282 lb 6.4 oz (128.096 kg)  SpO2:  95%    General: NAD, obese Son and husband Neck: Thick, JVP difficult to assess d/t body habitus but does appear elevated, no thyromegaly or thyroid nodule.  Lungs: Clear to auscultation bilaterally with normal respiratory effort. Chronic 2L oxygen CV: Nondisplaced PMI.  Heart regular S1/S2, no S3/S4, 2/6 SEM RUSB.  Trace woody edema with chronic erythema.  No carotid bruit.  Abdomen: Soft, nontender, no hepatosplenomegaly, Obese Neurologic: Alert and oriented x 3.  Psych: Normal affect. Extremities: No clubbing/cyanosis, bilateral venous stasis Rand LLE trace-1+ edema.    Assessment/Plan:  1. Chronic diastolic CHF:   - Chronic NYHA III symptoms. Volume status elevated today but not surprising given ongoing fluid noncompliance.  continue torsemide 80 mg BID. Increase metolazone 2.5 mg on Tuesdays, Thursday,  and Saturdays.  I have stressed the importance of limiting fluid intake to < 2 liters and cutting back on ice intake.  -- Reinforced the need and importance of daily weights, a low sodium diet, and fluid restriction (less than 2 L a day). Instructed to call the HF clinic if weight increases more than 3 lbs overnight or 5 lbs in a week.  2. Pulmonary: Suspect OHS/OSA.   - Followed by Dr Elsworth Soho. Per Dr Elsworth Soho no role for CPAP yet. Continue home oxygen continuous. 3. Obesity - Continue to cut  back potions as needed. Needs to lose weight  4. Renal insufficieny - Baseline 1.3-1.6. Check BMET next visit.    Follow up 2  weeks to reassess volume status.  CLEGG,AMY NP-C  11/21/2013

## 2013-11-20 DIAGNOSIS — M1710 Unilateral primary osteoarthritis, unspecified knee: Secondary | ICD-10-CM | POA: Diagnosis not present

## 2013-11-21 ENCOUNTER — Ambulatory Visit (HOSPITAL_COMMUNITY)
Admission: RE | Admit: 2013-11-21 | Discharge: 2013-11-21 | Disposition: A | Discharge status: Home / Self Care | Payer: Medicare Other | Source: Ambulatory Visit | Attending: Internal Medicine | Admitting: Internal Medicine

## 2013-11-21 VITALS — BP 144/64 | HR 90 | Resp 18 | Wt 282.4 lb

## 2013-11-21 DIAGNOSIS — I5032 Chronic diastolic (congestive) heart failure: Secondary | ICD-10-CM

## 2013-11-21 DIAGNOSIS — E669 Obesity, unspecified: Secondary | ICD-10-CM | POA: Insufficient documentation

## 2013-11-21 DIAGNOSIS — Z794 Long term (current) use of insulin: Secondary | ICD-10-CM | POA: Diagnosis not present

## 2013-11-21 DIAGNOSIS — G4733 Obstructive sleep apnea (adult) (pediatric): Secondary | ICD-10-CM | POA: Insufficient documentation

## 2013-11-21 DIAGNOSIS — E662 Morbid (severe) obesity with alveolar hypoventilation: Secondary | ICD-10-CM | POA: Insufficient documentation

## 2013-11-21 DIAGNOSIS — E785 Hyperlipidemia, unspecified: Secondary | ICD-10-CM | POA: Diagnosis not present

## 2013-11-21 DIAGNOSIS — I10 Essential (primary) hypertension: Secondary | ICD-10-CM | POA: Insufficient documentation

## 2013-11-21 DIAGNOSIS — E119 Type 2 diabetes mellitus without complications: Secondary | ICD-10-CM | POA: Diagnosis not present

## 2013-11-21 DIAGNOSIS — Z7982 Long term (current) use of aspirin: Secondary | ICD-10-CM | POA: Diagnosis not present

## 2013-11-21 DIAGNOSIS — N289 Disorder of kidney and ureter, unspecified: Secondary | ICD-10-CM | POA: Insufficient documentation

## 2013-11-21 DIAGNOSIS — I509 Heart failure, unspecified: Secondary | ICD-10-CM | POA: Diagnosis present

## 2013-11-21 MED ORDER — METOLAZONE 2.5 MG PO TABS: ORAL_TABLET | ORAL | Status: DC

## 2013-11-21 NOTE — Patient Instructions (Signed)
Follow up in 2 weeks  Do the following things EVERYDAY: 1) Weigh yourself in the morning before breakfast. Write it down and keep it in a log. 2) Take your medicines as prescribed 3) Eat low salt foods--Limit salt (sodium) to 2000 mg per day.  4) Stay as active as you can everyday 5) Limit all fluids for the day to less than 2 liters 

## 2013-11-26 ENCOUNTER — Other Ambulatory Visit (HOSPITAL_COMMUNITY): Payer: Self-pay | Admitting: Internal Medicine

## 2013-11-26 DIAGNOSIS — H02839 Dermatochalasis of unspecified eye, unspecified eyelid: Secondary | ICD-10-CM | POA: Diagnosis not present

## 2013-11-26 DIAGNOSIS — H18413 Arcus senilis, bilateral: Secondary | ICD-10-CM | POA: Diagnosis not present

## 2013-11-26 DIAGNOSIS — H25013 Cortical age-related cataract, bilateral: Secondary | ICD-10-CM | POA: Diagnosis not present

## 2013-11-26 DIAGNOSIS — H11001 Unspecified pterygium of right eye: Secondary | ICD-10-CM | POA: Diagnosis not present

## 2013-12-04 NOTE — Progress Notes (Signed)
Patient ID: Melissa Myers, female   DOB: 1945-10-26, 68 y.o.   MRN: [id number]  PCP: Dr. Asa Lente Endocrinologist: Dr Loanne Drilling  68 yo with history of HTN, DM, hyperlipidemia, OHS/OSA, and chronic dyspnea/diastolic CHF.  Patient had an echo in 04/2009 showing moderate LVH and preserved LV systolic function. However, there was a very large LV mid-cavity gradient with valsalva.  Patient developed quite significant exertional dyspnea to the point where she was short of breath walking around her house. She had a pulmonary evaluation with Dr. Elsworth Soho but no primary lung problems were identified. Patient had an ETT-myoview in 05/2009 which was negative for ischemia or infarction. Right heart cath in 05/2009 showed mildly elevated right heart filling pressures but normal PA pressure and normal PCWP. She was started her on a beta blocker (Coreg) to try to lower her LV mid-cavity gradient (that likely occurs with exertion) and to better control BP.  V/Q scan was negative for PE.  PFTs from 06/2011 showed a restrictive defect. Last echo in 06/2013 showed EF 60-65% with normal RV size and systolic function.      Follow up for Heart Failure: Last visit she was instructed increase metoalzone to 2.5 mg three times a week. She says she didn't take it today but plans to take it later today. Weight at home 276 and trending up to 285 pounds. Complaining of dyspnea with exertion. Increased lower extremity edema. Complaining of LLE erythema and pain over the last week. She continues to eat a lot of ice. Drinking  >2 liters of fluid per day. Eating hot dogs.  Ambulates with walker.    Labs (10/13): K 4.1, creatinine 0.85 Labs (4/14): K 3.7, creatinine 1.2, BUN 43, BNP 23 Labs (5/14): K 4.1, creatinine 1.0 Labs (7/14): K 4.6 Creatinine 1.0 BNP 39.0  Labs (8/14): K 3.5, creatinine 0.91, BNP 71 Labs (11/14): K 3.7, creatinine 0.9 Labs (12/14): K 4.1, creatinine 0.8, BNP 52 Labs (3/15): K 4, creatinine 0.9 Labs (4/15): K 3.7,  creatinine 0.9 Labs (5/15): K 3.8, creatinine 0.9, BNP 29 Labs (6/15): K 3.4, BUN 105, creatinine 0.9 =>1.7 Labs (6/17/5) K 4.2, BUN 41, Creatinine 0.80 Labs (08/19/13) K 3.2, BUN 71, creatinine 1.5 Labs 09/05/13 K 4.1 Creatinine 1.1 Labs 11/07/13 K 3.6 Creatinine 0.91  Allergies (verified):  1) ! Sulfa  2) Morphine   Past Medical History:  1. Diabetes mellitus, type II  2. Hyperlipidemia: She has been unable to tolerate statins (has been on Vytorin, lovastatin, and Crestor) due to what sounds like rhabdo: developed muscle weakness and was told she had "muscle damage" with each of these statins. She has been told not to take statins anymore.  3. Hypertension 4. Obesity  5. Restless Leg Syndrome  6. OA  7. peripheral neuropathy  8. OHS/OSA: Uses supplemental oxygen, does not tolerate CPAP.   9. Chronic nausea/diarrhea: ? IBS  10. Diastolic CHF: Echo (3/41) with normal LV size, moderate LVH, EF 65-70%, LV mid-cavity gradient reaching 63 mmHg with valsalva but minimal at rest, grade I diastolic dysfunction, cannot estimate PA systolic pressure (no TR doppler signal), RV normal. RHC (4/11): Mean RA 11, RV 37/13, PA 33/15, mean PCWP 12, CI 3.3. Echo in 11/12 showed mild LVH, EF 65-70%, no LV mid-cavity gradient was mentioned.  Echo in 4/14 showed EF 65-70%, mild LVH, grade I diastolic dysfunction, PA systolic pressure 35 mmHg, mild LV mid-cavity gradient.  Echo (5/15) with EF 60-65%, mild TR, normal RV size and systolic function.  11. ETT-myoview (  4/11): 5'30", stopped due to fatigue, normal EF, no evidence for scar or ischemia. 12. V/Q scan 11/12: negative for PE.  Lower extremity venous doppler US (3/14): negative for DVT.  13. PFTs (5/13): FVC 50%, FEV1 62%, ratio 106%, DLCO 69%, TLC 69%.   Family History:  Family History of Alcoholism/Addiction (parent)  Family History Diabetes 1st degree relative (grandparent)  Family History High cholesterol (parent, grandparent)  Family History  Hypertension (parent, grandparent)  Family History Lung cancer (grandparent)  Stomach cancer (grandmother)  Celiac Sprue daughter  Mother with MI at 1, father with MI at 67, uncle with MI at 8, brother with stents in his 61s, multiple aunts with MIs and CVAs   Social History:  Never Smoked  no alcohol  married, lives with spouse and her mother  retired Network engineer - now housewife  Alcohol Use - no  Illicit Drug Use - no  Review of Systems  All systems reviewed and negative except as per HPI.   ROS: All systems reviewed and negative except as per HPI.   Current Outpatient Prescriptions  Medication Sig Dispense Refill  . APIDRA 100 UNIT/ML injection INJECT 40 UNITS WITH BREAKFAST, 50 UNITS WITH LUNCH, AND 75 UNITS WITH DINNER  50 mL  0  . aspirin 81 MG tablet Take 81 mg by mouth daily.       . carvedilol (COREG) 25 MG tablet Take 1 tablet (25 mg total) by mouth 2 (two) times daily with a meal.  60 tablet  6  . cholecalciferol (VITAMIN D) 1000 UNITS tablet Take 1,000 Units by mouth daily.      . Coenzyme Q10 (COQ10) 30 MG CAPS Take 30 mg by mouth daily.       . DULoxetine (CYMBALTA) 60 MG capsule Take 1 capsule (60 mg total) by mouth daily.  90 capsule  3  . fenofibrate 160 MG tablet TAKE 1 TABLET BY MOUTH EVERY DAY.  30 tablet  5  . hydrALAZINE (APRESOLINE) 25 MG tablet TAKE 1 TABLET BY MOUTH THREE TIMES DAILY  90 tablet  6  . insulin glargine (LANTUS) 100 UNIT/ML injection Inject 60 Units into the skin at bedtime.      . insulin glulisine (APIDRA) 100 UNIT/ML injection Inject 60-70 Units into the skin See admin instructions. Take 60 units before breakfast and lunch, and 70 units before dinner.      . Insulin Syringe-Needle U-100 (INSULIN SYRINGE 1CC/31GX5/16") 31G X 5/16" 1 ML MISC USE FOUR TIMES DAILY AS DIRECTED  100 each  PRN  . LANTUS 100 UNIT/ML injection INJECT 60 UNITS EVERY NIGHT AT BEDTIME  30 mL  0  . Magnesium 200 MG TABS Take 200 mg by mouth daily.   60 each    .  metolazone (ZAROXOLYN) 2.5 MG tablet Take on Tuesday Thursday and Saturday.  12 tablet  3  . omega-3 acid ethyl esters (LOVAZA) 1 G capsule TAKE 2 CAPSULES BY MOUTH TWICE DAILY  180 capsule  0  . potassium chloride SA (K-DUR,KLOR-CON) 20 MEQ tablet 2 tab (89meq) 3x/day on Tue/Thur/Sat AND 2 tab (51meq) 2x/day all other days  120 tablet  3  . pregabalin (LYRICA) 100 MG capsule Take 1 capsule (100 mg total) by mouth 3 (three) times daily.  90 capsule  5  . rOPINIRole (REQUIP) 2 MG tablet Take 2 mg by mouth 2 (two) times daily.      [name] torsemide (DEMADEX) 20 MG tablet TAKE 4 TABLETS BY MOUTH TWICE DAILY.  240 tablet  0   No current facility-administered medications for this encounter.   Filed Vitals:   12/05/13 1404  BP: 150/66  Pulse: 67  Resp: 22  Weight: 285 lb 2 oz (129.332 kg)  SpO2: 92%    General: NAD, obese Husband present Neck: Thick, JVP difficult to assess d/t body habitus but appear elevated, no thyromegaly or thyroid nodule.  Lungs: Clear to auscultation bilaterally with normal respiratory effort. Chronic 2L oxygen CV: Nondisplaced PMI.  Heart regular S1/S2, no S3/S4, 2/6 SEM RUSB.  Trace woody edema with chronic erythema.  No carotid bruit.  Abdomen: Soft, nontender, no hepatosplenomegaly, Obese Neurologic: Alert and oriented x 3.  Psych: Normal affect. Extremities: No clubbing/cyanosis, bilateral venous stasis RLE 3+edema with  erythema. LLE 2+ edema.    Assessment/Plan: 1. Acute/Chronic diastolic CHF:   - Today she presents with marked volume over load despite increasing home diuretic regimen.  Admit to telemetry and diurese wit IV lasix.  2. Pulmonary: Suspect OHS/OSA.   - Followed by Dr Elsworth Soho. Per Dr Elsworth Soho no role for CPAP yet. Continue oxygen continuous. 3. Obesity - Consult dietitian.for weight loss recommendations and HF.   4. Renal insufficieny - Baseline 1.3-1.6. Check BMET today.    5 LLE Cellulitis- Admit today for IV antibiotics.    CLEGG,AMY NP-C   12/05/2013

## 2013-12-05 ENCOUNTER — Encounter (HOSPITAL_COMMUNITY): Payer: Self-pay

## 2013-12-05 ENCOUNTER — Encounter (HOSPITAL_COMMUNITY): Payer: Self-pay | Admitting: General Practice

## 2013-12-05 ENCOUNTER — Ambulatory Visit (HOSPITAL_BASED_OUTPATIENT_CLINIC_OR_DEPARTMENT_OTHER)
Admission: RE | Admit: 2013-12-05 | Discharge: 2013-12-05 | Disposition: A | Discharge status: Home / Self Care | Payer: Medicare Other | Source: Ambulatory Visit | Attending: Cardiology | Admitting: Cardiology

## 2013-12-05 ENCOUNTER — Inpatient Hospital Stay (HOSPITAL_COMMUNITY)
Admission: AD | Admit: 2013-12-05 | Discharge: 2013-12-11 | DRG: 292 | Disposition: A | Discharge status: Home Health | Payer: Medicare Other | Source: Ambulatory Visit | Attending: Internal Medicine | Admitting: Internal Medicine

## 2013-12-05 VITALS — BP 150/66 | HR 67 | Resp 22 | Wt 285.1 lb

## 2013-12-05 DIAGNOSIS — Z794 Long term (current) use of insulin: Secondary | ICD-10-CM

## 2013-12-05 DIAGNOSIS — D649 Anemia, unspecified: Secondary | ICD-10-CM | POA: Diagnosis present

## 2013-12-05 DIAGNOSIS — E119 Type 2 diabetes mellitus without complications: Secondary | ICD-10-CM | POA: Diagnosis present

## 2013-12-05 DIAGNOSIS — Z9981 Dependence on supplemental oxygen: Secondary | ICD-10-CM | POA: Diagnosis not present

## 2013-12-05 DIAGNOSIS — M1990 Unspecified osteoarthritis, unspecified site: Secondary | ICD-10-CM | POA: Diagnosis present

## 2013-12-05 DIAGNOSIS — E662 Morbid (severe) obesity with alveolar hypoventilation: Secondary | ICD-10-CM | POA: Diagnosis present

## 2013-12-05 DIAGNOSIS — I5033 Acute on chronic diastolic (congestive) heart failure: Secondary | ICD-10-CM | POA: Diagnosis not present

## 2013-12-05 DIAGNOSIS — Z7982 Long term (current) use of aspirin: Secondary | ICD-10-CM

## 2013-12-05 DIAGNOSIS — Z79899 Other long term (current) drug therapy: Secondary | ICD-10-CM

## 2013-12-05 DIAGNOSIS — I10 Essential (primary) hypertension: Secondary | ICD-10-CM

## 2013-12-05 DIAGNOSIS — R601 Generalized edema: Secondary | ICD-10-CM | POA: Diagnosis not present

## 2013-12-05 DIAGNOSIS — D508 Other iron deficiency anemias: Secondary | ICD-10-CM

## 2013-12-05 DIAGNOSIS — H9190 Unspecified hearing loss, unspecified ear: Secondary | ICD-10-CM | POA: Diagnosis present

## 2013-12-05 DIAGNOSIS — L0390 Cellulitis, unspecified: Secondary | ICD-10-CM | POA: Insufficient documentation

## 2013-12-05 DIAGNOSIS — I872 Venous insufficiency (chronic) (peripheral): Secondary | ICD-10-CM | POA: Diagnosis present

## 2013-12-05 DIAGNOSIS — I517 Cardiomegaly: Secondary | ICD-10-CM | POA: Diagnosis not present

## 2013-12-05 DIAGNOSIS — J9610 Chronic respiratory failure, unspecified whether with hypoxia or hypercapnia: Secondary | ICD-10-CM | POA: Diagnosis present

## 2013-12-05 DIAGNOSIS — Z6841 Body Mass Index (BMI) 40.0 and over, adult: Secondary | ICD-10-CM | POA: Diagnosis not present

## 2013-12-05 DIAGNOSIS — G4733 Obstructive sleep apnea (adult) (pediatric): Secondary | ICD-10-CM | POA: Diagnosis present

## 2013-12-05 DIAGNOSIS — L03116 Cellulitis of left lower limb: Secondary | ICD-10-CM | POA: Diagnosis not present

## 2013-12-05 DIAGNOSIS — L03119 Cellulitis of unspecified part of limb: Secondary | ICD-10-CM

## 2013-12-05 DIAGNOSIS — G2581 Restless legs syndrome: Secondary | ICD-10-CM | POA: Diagnosis present

## 2013-12-05 DIAGNOSIS — G629 Polyneuropathy, unspecified: Secondary | ICD-10-CM | POA: Diagnosis present

## 2013-12-05 DIAGNOSIS — E785 Hyperlipidemia, unspecified: Secondary | ICD-10-CM | POA: Diagnosis present

## 2013-12-05 DIAGNOSIS — L02419 Cutaneous abscess of limb, unspecified: Secondary | ICD-10-CM

## 2013-12-05 DIAGNOSIS — R0602 Shortness of breath: Secondary | ICD-10-CM | POA: Diagnosis not present

## 2013-12-05 DIAGNOSIS — I5031 Acute diastolic (congestive) heart failure: Secondary | ICD-10-CM | POA: Diagnosis present

## 2013-12-05 LAB — CBC WITH DIFFERENTIAL/PLATELET
Basophils Absolute: 0 10*3/uL (ref 0.0–0.1)
Basophils Relative: 0 % (ref 0–1)
Eosinophils Absolute: 0.1 10*3/uL (ref 0.0–0.7)
Eosinophils Relative: 2 % (ref 0–5)
HCT: 57.3 % — ABNORMAL HIGH (ref 36.0–46.0)
HEMOGLOBIN: 17.4 g/dL — AB (ref 12.0–15.0)
LYMPHS PCT: 34 % (ref 12–46)
Lymphs Abs: 1.5 10*3/uL (ref 0.7–4.0)
MCH: 24.4 pg — ABNORMAL LOW (ref 26.0–34.0)
MCHC: 30.4 g/dL (ref 30.0–36.0)
MCV: 80.4 fL (ref 78.0–100.0)
MONO ABS: 0.3 10*3/uL (ref 0.1–1.0)
MONOS PCT: 6 % (ref 3–12)
NEUTROS ABS: 2.5 10*3/uL (ref 1.7–7.7)
NEUTROS PCT: 58 % (ref 43–77)
Platelets: 132 10*3/uL — ABNORMAL LOW (ref 150–400)
RBC: 7.13 MIL/uL — ABNORMAL HIGH (ref 3.87–5.11)
RDW: 16.5 % — ABNORMAL HIGH (ref 11.5–15.5)
WBC: 4.3 10*3/uL (ref 4.0–10.5)

## 2013-12-05 LAB — COMPREHENSIVE METABOLIC PANEL
ALT: 19 U/L (ref 0–35)
ANION GAP: 15 (ref 5–15)
AST: 21 U/L (ref 0–37)
Albumin: 2.8 g/dL — ABNORMAL LOW (ref 3.5–5.2)
Alkaline Phosphatase: 53 U/L (ref 39–117)
BUN: 28 mg/dL — AB (ref 6–23)
CO2: 33 mEq/L — ABNORMAL HIGH (ref 19–32)
CREATININE: 0.84 mg/dL (ref 0.50–1.10)
Calcium: 9.5 mg/dL (ref 8.4–10.5)
Chloride: 94 mEq/L — ABNORMAL LOW (ref 96–112)
GFR calc Af Amer: 81 mL/min — ABNORMAL LOW (ref >90)
GFR calc non Af Amer: 70 mL/min — ABNORMAL LOW (ref >90)
Glucose, Bld: 86 mg/dL (ref 70–99)
POTASSIUM: 3.3 meq/L — AB (ref 3.7–5.3)
Sodium: 142 mEq/L (ref 137–147)
TOTAL PROTEIN: 6.8 g/dL (ref 6.0–8.3)
Total Bilirubin: 0.2 mg/dL — ABNORMAL LOW (ref 0.3–1.2)

## 2013-12-05 LAB — MAGNESIUM: Magnesium: 2.1 mg/dL (ref 1.5–2.5)

## 2013-12-05 LAB — GLUCOSE, CAPILLARY
GLUCOSE-CAPILLARY: 97 mg/dL (ref 70–99)
Glucose-Capillary: 153 mg/dL — ABNORMAL HIGH (ref 70–99)

## 2013-12-05 LAB — PRO B NATRIURETIC PEPTIDE: PRO B NATRI PEPTIDE: 52.4 pg/mL (ref 0–125)

## 2013-12-05 LAB — TSH: TSH: 2.01 u[IU]/mL (ref 0.350–4.500)

## 2013-12-05 MED ORDER — VITAMIN D3 25 MCG (1000 UNIT) PO TABS
1000.0000 [IU] | ORAL_TABLET | Freq: Every day | ORAL | Status: DC
  Administered 2013-12-06 – 2013-12-11 (×6): 1000 [IU] via ORAL
  Filled 2013-12-05 (×6): qty 1

## 2013-12-05 MED ORDER — OXYCODONE-ACETAMINOPHEN 5-325 MG PO TABS
1.0000 | ORAL_TABLET | Freq: Three times a day (TID) | ORAL | Status: DC | PRN
  Administered 2013-12-06: 1 via ORAL
  Filled 2013-12-05: qty 1

## 2013-12-05 MED ORDER — VANCOMYCIN HCL 10 G IV SOLR
2500.0000 mg | Freq: Once | INTRAVENOUS | Status: AC
  Administered 2013-12-05: 2500 mg via INTRAVENOUS
  Filled 2013-12-05: qty 2500

## 2013-12-05 MED ORDER — HYDRALAZINE HCL 25 MG PO TABS
25.0000 mg | ORAL_TABLET | Freq: Three times a day (TID) | ORAL | Status: DC
  Administered 2013-12-05 – 2013-12-11 (×18): 25 mg via ORAL
  Filled 2013-12-05 (×20): qty 1

## 2013-12-05 MED ORDER — INSULIN GLULISINE 100 UNIT/ML IJ SOLN: 70.0000 [IU] | Freq: Every day | INTRAMUSCULAR | Status: DC

## 2013-12-05 MED ORDER — INSULIN ASPART 100 UNIT/ML ~~LOC~~ SOLN
0.0000 [IU] | Freq: Three times a day (TID) | SUBCUTANEOUS | Status: DC
  Administered 2013-12-06 (×2): 3 [IU] via SUBCUTANEOUS
  Administered 2013-12-07: 7 [IU] via SUBCUTANEOUS
  Administered 2013-12-07: 4 [IU] via SUBCUTANEOUS
  Administered 2013-12-08: 3 [IU] via SUBCUTANEOUS
  Administered 2013-12-08: 4 [IU] via SUBCUTANEOUS
  Administered 2013-12-09: 3 [IU] via SUBCUTANEOUS
  Administered 2013-12-10: 7 [IU] via SUBCUTANEOUS
  Administered 2013-12-11: 11 [IU] via SUBCUTANEOUS
  Administered 2013-12-11: 3 [IU] via SUBCUTANEOUS

## 2013-12-05 MED ORDER — INSULIN GLULISINE 100 UNIT/ML IJ SOLN: 60.0000 [IU] | Freq: Two times a day (BID) | INTRAMUSCULAR | Status: DC

## 2013-12-05 MED ORDER — SODIUM CHLORIDE 0.9 % IV SOLN
250.0000 mL | INTRAVENOUS | Status: DC | PRN
Start: 2013-12-05 — End: 2013-12-11

## 2013-12-05 MED ORDER — CARVEDILOL 25 MG PO TABS
25.0000 mg | ORAL_TABLET | Freq: Two times a day (BID) | ORAL | Status: DC
  Administered 2013-12-05 – 2013-12-11 (×12): 25 mg via ORAL
  Filled 2013-12-05 (×15): qty 1

## 2013-12-05 MED ORDER — INSULIN ASPART 100 UNIT/ML ~~LOC~~ SOLN: 70.0000 [IU] | Freq: Every day | SUBCUTANEOUS | Status: DC

## 2013-12-05 MED ORDER — PREGABALIN 100 MG PO CAPS
100.0000 mg | ORAL_CAPSULE | Freq: Three times a day (TID) | ORAL | Status: DC
Start: 2013-12-05 — End: 2013-12-11
  Administered 2013-12-05 – 2013-12-11 (×18): 100 mg via ORAL
  Filled 2013-12-05 (×18): qty 1

## 2013-12-05 MED ORDER — INSULIN GLARGINE 100 UNIT/ML ~~LOC~~ SOLN
60.0000 [IU] | Freq: Every day | SUBCUTANEOUS | Status: DC
  Administered 2013-12-06 – 2013-12-09 (×5): 60 [IU] via SUBCUTANEOUS
  Filled 2013-12-05 (×7): qty 0.6

## 2013-12-05 MED ORDER — ENOXAPARIN SODIUM 40 MG/0.4ML ~~LOC~~ SOLN
40.0000 mg | SUBCUTANEOUS | Status: DC
  Administered 2013-12-05: 40 mg via SUBCUTANEOUS
  Filled 2013-12-05 (×2): qty 0.4

## 2013-12-05 MED ORDER — SODIUM CHLORIDE 0.9 % IJ SOLN: 3.0000 mL | INTRAMUSCULAR | Status: DC | PRN

## 2013-12-05 MED ORDER — MAGNESIUM 200 MG PO TABS: 200.0000 mg | ORAL_TABLET | Freq: Every day | ORAL | Status: DC

## 2013-12-05 MED ORDER — ROPINIROLE HCL 1 MG PO TABS
2.0000 mg | ORAL_TABLET | Freq: Two times a day (BID) | ORAL | Status: DC
Start: 2013-12-05 — End: 2013-12-11
  Administered 2013-12-06 – 2013-12-11 (×12): 2 mg via ORAL
  Filled 2013-12-05 (×14): qty 2

## 2013-12-05 MED ORDER — ACETAMINOPHEN 325 MG PO TABS: 650.0000 mg | ORAL_TABLET | ORAL | Status: DC | PRN

## 2013-12-05 MED ORDER — POTASSIUM CHLORIDE CRYS ER 20 MEQ PO TBCR
40.0000 meq | EXTENDED_RELEASE_TABLET | Freq: Two times a day (BID) | ORAL | Status: DC
  Administered 2013-12-06 – 2013-12-11 (×12): 40 meq via ORAL
  Filled 2013-12-05 (×17): qty 2

## 2013-12-05 MED ORDER — OMEGA-3-ACID ETHYL ESTERS 1 G PO CAPS
1.0000 g | ORAL_CAPSULE | Freq: Two times a day (BID) | ORAL | Status: DC
  Administered 2013-12-06 – 2013-12-11 (×12): 1 g via ORAL
  Filled 2013-12-05 (×13): qty 1

## 2013-12-05 MED ORDER — ONDANSETRON HCL 4 MG/2ML IJ SOLN: 4.0000 mg | Freq: Four times a day (QID) | INTRAMUSCULAR | Status: DC | PRN

## 2013-12-05 MED ORDER — FUROSEMIDE 10 MG/ML IJ SOLN
80.0000 mg | Freq: Two times a day (BID) | INTRAMUSCULAR | Status: DC
  Administered 2013-12-05: 80 mg via INTRAVENOUS
  Filled 2013-12-05 (×2): qty 8

## 2013-12-05 MED ORDER — MAGNESIUM OXIDE 400 (241.3 MG) MG PO TABS
200.0000 mg | ORAL_TABLET | Freq: Every day | ORAL | Status: DC
  Administered 2013-12-06 – 2013-12-11 (×6): 200 mg via ORAL
  Filled 2013-12-05 (×6): qty 0.5

## 2013-12-05 MED ORDER — DULOXETINE HCL 60 MG PO CPEP
60.0000 mg | ORAL_CAPSULE | Freq: Every day | ORAL | Status: DC
  Administered 2013-12-06 – 2013-12-11 (×6): 60 mg via ORAL
  Filled 2013-12-05 (×6): qty 1

## 2013-12-05 MED ORDER — SODIUM CHLORIDE 0.9 % IJ SOLN
3.0000 mL | Freq: Two times a day (BID) | INTRAMUSCULAR | Status: DC
  Administered 2013-12-06 – 2013-12-11 (×11): 3 mL via INTRAVENOUS

## 2013-12-05 MED ORDER — ASPIRIN 81 MG PO TABS: 81.0000 mg | ORAL_TABLET | Freq: Every day | ORAL | Status: DC

## 2013-12-05 MED ORDER — VANCOMYCIN HCL IN DEXTROSE 1-5 GM/200ML-% IV SOLN
1000.0000 mg | Freq: Two times a day (BID) | INTRAVENOUS | Status: DC
  Administered 2013-12-06: 1000 mg via INTRAVENOUS
  Filled 2013-12-05: qty 200

## 2013-12-05 MED ORDER — FENOFIBRATE 160 MG PO TABS
160.0000 mg | ORAL_TABLET | Freq: Every day | ORAL | Status: DC
  Administered 2013-12-06 – 2013-12-11 (×6): 160 mg via ORAL
  Filled 2013-12-05 (×6): qty 1

## 2013-12-05 MED ORDER — INSULIN ASPART 100 UNIT/ML ~~LOC~~ SOLN
60.0000 [IU] | Freq: Two times a day (BID) | SUBCUTANEOUS | Status: DC
  Administered 2013-12-06: 60 [IU] via SUBCUTANEOUS

## 2013-12-05 MED ORDER — ASPIRIN EC 81 MG PO TBEC
81.0000 mg | DELAYED_RELEASE_TABLET | Freq: Every day | ORAL | Status: DC
  Administered 2013-12-06 – 2013-12-11 (×6): 81 mg via ORAL
  Filled 2013-12-05 (×6): qty 1

## 2013-12-05 NOTE — H&P (Signed)
HPI: Melissa Myers is a 68 year old with a history of DM, hyperlipidemia, OHS/OSA, obesity, on chronic oxygen at 2 liters [place] continuously, and chronic dyspnea/diastolic CHF.   Patient had an echo in 04/2009 showing moderate LVH and preserved LV systolic function. However, there was a very large LV mid-cavity gradient with valsalva. Patient developed quite significant exertional dyspnea to the point where she was short of breath walking around her house. She had a pulmonary evaluation with Dr. Elsworth Soho but no primary lung problems were identified. Patient had an ETT-myoview in 05/2009 which was negative for ischemia or infarction. Right heart cath in 05/2009 showed mildly elevated right heart filling pressures but normal PA pressure and normal PCWP. She was started her on a beta blocker (Coreg) to try to lower her LV mid-cavity gradient (that likely occurs with exertion) and to better control BP. V/Q scan was negative for PE. PFTs from 06/2011 showed a restrictive defect. Last echo in 06/2013 showed EF 60-65% with normal RV size and systolic function.    Today she was evaluated in the HF clinic with marked volume overload and LLE erythema. Weight has been trending up despite increasing oral diuretics. Weight at home 276 and trending up to 285 pounds. Complaining of dyspnea with exertion. Increased lower extremity edema. Complaining of LLE erythema and pain over the last week. She continues to eat a lot of ice. Drinking >2 liters of fluid per day. Eating hot dogs. Ambulates with walker.    Review of Systems:     Cardiac Review of Systems: {Y] = yes [ ]  = no  Chest Pain [    ]  Resting SOB [   ] Exertional SOB  [Y  ]  Vertell Limber Jazmín.Cullens  ]   Pedal Edema [   ]    Palpitations [  ] Syncope  [  ]   Presyncope [   ]  General Review of Systems: [Y] = yes [  ]=no Constitional: recent weight change [Y  ]; anorexia [  ]; fatigue [ Y ]; nausea [  ]; night sweats [  ]; fever [  ]; or chills [  ];                                                                      Dental: poor dentition[  ];   Eye : blurred vision [  ]; diplopia [   ]; vision changes [  ];  Amaurosis fugax[  ]; Resp: cough [  ];  wheezing[  ];  hemoptysis[  ]; shortness of breath[  ]; paroxysmal nocturnal dyspnea[  ]; dyspnea on exertion[  ]; or orthopnea[  ];  GI:  gallstones[  ], vomiting[  ];  dysphagia[  ]; melena[  ];  hematochezia [  ]; heartburn[  ];   Hx of  Colonoscopy[  ]; GU: kidney stones [  ]; hematuria[  ];   dysuria [  ];  nocturia[  ];               Skin: rash [  ], swelling[  ];, hair loss[  ];  peripheral edema[  ];  or itching[  ]; Musculosketetal: myalgias[  ];  joint swelling[  ];  joint  erythema[  ];  joint pain[ Y ];  back pain[  ];  Heme/Lymph: bruising[  ];  bleeding[  ];  anemia[  ];  Neuro: TIA[  ];  headaches[  ];  stroke[  ];  vertigo[  ];  seizures[  ];   paresthesias[  ];  difficulty walking[  ];  Psych:depression[Y  ]; anxiety[  ];  Endocrine: diabetes[Y  ];  thyroid dysfunction[  ];  Other:  Past Medical History  Diagnosis Date  . Type II or unspecified type diabetes mellitus without mention of complication, not stated as uncontrolled     insulin dep  . Hyperlipidemia     hx rhabdo on statins  . Hypertension   . Diastolic CHF   . Chronic diarrhea     a/w nausea - felt related to IBS  . RLS (restless legs syndrome)   . Osteoarthritis   . Stasis dermatitis   . GERD (gastroesophageal reflux disease)   . On home oxygen therapy     uses oxygen 2 liters min per [place] at night and prn during day  . Anemia   . Neuropathy     feet, toes and fingers  . Disc degeneration, lumbar   . OSA (obstructive sleep apnea)     05/2009 sleep study - refuses CPAP  . Deaf     left side only  . Shortness of breath     chronic      Allergies  Allergen Reactions  . Levemir [Insulin Detemir]     itching  . Morphine     REACTION: GI upset and headaches  . Statins     Statin drugs cause muscle damage  . Sulfa  Antibiotics Diarrhea  . Sulfonamide Derivatives Diarrhea  . Zinc Swelling and Rash    History   Social History  . Marital Status: Married    Spouse Name: N/A    Number of Children: N/A  . Years of Education: N/A   Occupational History  . Not on file.   Social History Main Topics  . Smoking status: Never Smoker   . Smokeless tobacco: Never Used  . Alcohol Use: No  . Drug Use: No  . Sexual Activity: No   Other Topics Concern  . Not on file   Social History Narrative   Lives with spouse and mother. Retired Network engineer, now housewife    Family History  Problem Relation Age of Onset  . Heart disease Mother   . Heart disease Father   . Heart disease      family history  . Stomach cancer Paternal Grandmother   . Lung cancer Paternal Grandfather   . Colon cancer Neg Hx   . CVA      several aunts  . Heart attack Mother 26  . Heart attack Father 61  . Heart attack      several aunts and an uncle    PHYSICAL EXAM: There were no vitals filed for this visit. General: NAD, obese Husband present  Neck: Thick, JVP difficult to assess d/t body habitus but appear elevated, no thyromegaly or thyroid nodule.  Lungs: Clear to auscultation bilaterally with normal respiratory effort. Chronic 2L oxygen  CV: Nondisplaced PMI. Heart regular S1/S2, no S3/S4, 2/6 SEM RUSB. . No carotid bruit.  Abdomen: Soft, nontender, no hepatosplenomegaly, Obese  Neurologic: Alert and oriented x 3.  Psych: Normal affect.  Extremities: No clubbing/cyanosis, bilateral venous stasis RLE 3+edema with erythema. LLE 2+ edema.    No results found for  this or any previous visit (from the past 24 hour(s)). No results found.   ASSESSMENT: 1. A/C diastolic heart failure  2. LLE Cellulitis 3. DM  4. Obesity 5. Chronic Respiratory Failure on 2 liters oxygen continuously  PLAN/DISCUSSION: Melissa Robertion is being admitted from the HF clinic with marked volume overload despite escalation of home diuretic  regimen (torsemide 80 mg twice a day metolazone 2.5 mg three times a week). Weight up at least  15 pounds. This is likely in part to excessive fluid intake. Will diureses with IV lasix 80 mg twice a day. Check labs  Start vancomycin for LLE cellulitis.   Consult dietitian and cardiac rehab. Will need home health when discharged.   Continue current home regimen for diabetes. Check hgb A1C.   CLEGG,AMY NP-C  3:01 PM  Patient seen and examined with Darrick Grinder, NP. We discussed all aspects of the encounter. I agree with the assessment and plan as stated above.   She has worsening volume overload not responding to increasing oral diuretics. Also possible LLE cellulitis. Will admit for IV diuresis and start abx.  Benay Spice 5:47 PM

## 2013-12-05 NOTE — Progress Notes (Signed)
Report given to receiving RN. Patient in bed sleeping. No signs or symptoms of distress or discomfort. 

## 2013-12-05 NOTE — Progress Notes (Signed)
ANTIBIOTIC CONSULT NOTE - INITIAL  Pharmacy Consult for vancomycin  Indication: cellulitis  Allergies  Allergen Reactions  . Levemir [Insulin Detemir]     itching  . Morphine     REACTION: GI upset and headaches  . Statins     Statin drugs cause muscle damage  . Sulfa Antibiotics Diarrhea  . Sulfonamide Derivatives Diarrhea  . Zinc Swelling and Rash    Patient Measurements:   Adjusted Body Weight:   Vital Signs: BP: 150/66 mmHg (10/15 1404) Pulse Rate: 67 (10/15 1404) Intake/Output from previous day:   Intake/Output from this shift:    Labs: No results found for this basename: WBC, HGB, PLT, LABCREA, CREATININE,  in the last 72 hours The CrCl is unknown because both a height and weight (above a minimum accepted value) are required for this calculation. No results found for this basename: VANCOTROUGH, VANCOPEAK, VANCORANDOM, GENTTROUGH, GENTPEAK, GENTRANDOM, TOBRATROUGH, TOBRAPEAK, TOBRARND, AMIKACINPEAK, AMIKACINTROU, AMIKACIN,  in the last 72 hours   Microbiology: No results found for this or any previous visit (from the past 720 hour(s)).  Medical History: Past Medical History  Diagnosis Date  . Type II or unspecified type diabetes mellitus without mention of complication, not stated as uncontrolled     insulin dep  . Hyperlipidemia     hx rhabdo on statins  . Hypertension   . Diastolic CHF   . Chronic diarrhea     a/w nausea - felt related to IBS  . RLS (restless legs syndrome)   . Osteoarthritis   . Stasis dermatitis   . GERD (gastroesophageal reflux disease)   . On home oxygen therapy     uses oxygen 2 liters min per [place] at night and prn during day  . Anemia   . Neuropathy     feet, toes and fingers  . Disc degeneration, lumbar   . OSA (obstructive sleep apnea)     05/2009 sleep study - refuses CPAP  . Deaf     left side only  . Shortness of breath     chronic    Medications:  See home med reconciliation  Assessment: 68 year old female with a  history of DM, hyperlipidemia, OHS/OSA, obesity, on chronic oxygen at 2 liters [place] continuously, and chronic dyspnea/diastolic CHF. Presents to [facility] with volume overload and LLE cellulitis.   Orders received to start patient on vancomycin for treatment of cellulitis. Labs pending. Scr was normal in September, will follow up labs upon admission.   Goal of Therapy:  Vancomycin trough level 10-15 mcg/ml  Plan: Vancomycin 2.5g IV x1 then 1000mg  IV q12 hours Follow up baseline cbc and bmet  Erin Hearing PharmD., BCPS Clinical Pharmacist Pager [phone] 12/05/2013 3:14 PM

## 2013-12-06 DIAGNOSIS — I517 Cardiomegaly: Secondary | ICD-10-CM

## 2013-12-06 LAB — BASIC METABOLIC PANEL
ANION GAP: 12 (ref 5–15)
BUN: 22 mg/dL (ref 6–23)
CO2: 34 mEq/L — ABNORMAL HIGH (ref 19–32)
CREATININE: 0.72 mg/dL (ref 0.50–1.10)
Calcium: 9.1 mg/dL (ref 8.4–10.5)
Chloride: 96 mEq/L (ref 96–112)
GFR calc Af Amer: >90 mL/min (ref >90)
GFR, EST NON AFRICAN AMERICAN: 86 mL/min — AB (ref >90)
Glucose, Bld: 150 mg/dL — ABNORMAL HIGH (ref 70–99)
Potassium: 4.3 mEq/L (ref 3.7–5.3)
SODIUM: 142 meq/L (ref 137–147)

## 2013-12-06 LAB — GLUCOSE, CAPILLARY
GLUCOSE-CAPILLARY: 69 mg/dL — AB (ref 70–99)
Glucose-Capillary: 119 mg/dL — ABNORMAL HIGH (ref 70–99)
Glucose-Capillary: 121 mg/dL — ABNORMAL HIGH (ref 70–99)
Glucose-Capillary: 139 mg/dL — ABNORMAL HIGH (ref 70–99)
Glucose-Capillary: 149 mg/dL — ABNORMAL HIGH (ref 70–99)
Glucose-Capillary: 175 mg/dL — ABNORMAL HIGH (ref 70–99)
Glucose-Capillary: 89 mg/dL (ref 70–99)
Glucose-Capillary: 94 mg/dL (ref 70–99)

## 2013-12-06 LAB — HEMOGLOBIN A1C
HEMOGLOBIN A1C: 7.1 % — AB (ref <5.7)
Mean Plasma Glucose: 157 mg/dL — ABNORMAL HIGH (ref <117)

## 2013-12-06 MED ORDER — INSULIN ASPART 100 UNIT/ML ~~LOC~~ SOLN
20.0000 [IU] | Freq: Every day | SUBCUTANEOUS | Status: DC
  Administered 2013-12-06 – 2013-12-10 (×4): 20 [IU] via SUBCUTANEOUS

## 2013-12-06 MED ORDER — FUROSEMIDE 10 MG/ML IJ SOLN
80.0000 mg | Freq: Three times a day (TID) | INTRAMUSCULAR | Status: DC
  Administered 2013-12-06 – 2013-12-11 (×15): 80 mg via INTRAVENOUS
  Filled 2013-12-06 (×17): qty 8

## 2013-12-06 MED ORDER — INSULIN ASPART 100 UNIT/ML ~~LOC~~ SOLN
20.0000 [IU] | Freq: Two times a day (BID) | SUBCUTANEOUS | Status: DC
  Administered 2013-12-07 – 2013-12-11 (×8): 20 [IU] via SUBCUTANEOUS

## 2013-12-06 MED ORDER — ENOXAPARIN SODIUM 60 MG/0.6ML ~~LOC~~ SOLN
60.0000 mg | Freq: Every day | SUBCUTANEOUS | Status: DC
  Administered 2013-12-06 – 2013-12-10 (×5): 60 mg via SUBCUTANEOUS
  Filled 2013-12-06 (×6): qty 0.6

## 2013-12-06 MED ORDER — SPIRONOLACTONE 12.5 MG HALF TABLET
12.5000 mg | ORAL_TABLET | Freq: Every day | ORAL | Status: DC
  Administered 2013-12-06 – 2013-12-07 (×2): 12.5 mg via ORAL
  Filled 2013-12-06 (×2): qty 1

## 2013-12-06 NOTE — Care Management Note (Addendum)
    Page 1 of 2   12/09/2013     2:17:21 PM CARE MANAGEMENT NOTE 12/09/2013  Patient:  Melissa Myers, Melissa Myers   Account Number:  [id number]  Date Initiated:  12/06/2013  Documentation initiated by:  [facility]  Subjective/Objective Assessment:   68 year old with a history of DM, hyperlipidemia, OHS/OSA, obesity, on chronic oxygen at 2 liters [place] continuously, and chronic dyspnea/diastolic CHF.//Home with spouse.     Action/Plan:   IV lasix 80 mg twice a day. Check labs.  IV vancomycin for LLE cellulitis. //Access for disposition needs.   Anticipated DC Date:  12/10/2013   Anticipated DC Plan:  [place]  CM consult  HF Clinic      [facility] Choice  HOME HEALTH   Choice offered to / List presented to:  C-1 Patient        [place] arranged  HH-1 RN  [place].   Status of service:  Completed, signed off Medicare Important Message given?  YES (If response is "NO", the following Medicare IM given date fields will be blank) Date Medicare IM given:  12/09/2013 Medicare IM given by:  Boyd Kerbs Date Additional Medicare IM given:   Additional Medicare IM given by:    Discharge Disposition:    Per UR Regulation:  Reviewed for med. necessity/level of care/duration of stay  If discussed at [place] of Stay Meetings, dates discussed:   12/10/2013    Comments:  12/09/13 Jameson Tormey J. Clydene Laming, RN, BSN, [employer] [phone] Spoke with pt at bedside regarding discharge planning for [facility]. Offered pt list of home health agencies to choose from.  Pt chose [facility] to render services. Hansel Feinstein, RN of [facility] notified.  No DME needs identified at this time.

## 2013-12-06 NOTE — Progress Notes (Signed)
Report given to receiving RN. Patient sitting in chair with family at bedside. No verbal complaints and no signs or symptoms of distress or discomfort.

## 2013-12-06 NOTE — Progress Notes (Signed)
Patient ID: Melissa Myers, female   DOB: [birth year], 68 y.o.   MRN: [id number]   SUBJECTIVE:  Melissa Myers is a 68 year old with a history of DM, hyperlipidemia, OHS/OSA, obesity, on chronic oxygen at 2 liters [place] continuously, and chronic dyspnea/diastolic CHF.   Patient had an echo in 04/2009 showing moderate LVH and preserved LV systolic function. However, there was a very large LV mid-cavity gradient with valsalva. Patient developed quite significant exertional dyspnea to the point where she was short of breath walking around her house. She had a pulmonary evaluation with Dr. Elsworth Soho but no primary lung problems were identified. Patient had an ETT-myoview in 05/2009 which was negative for ischemia or infarction. Right heart cath in 05/2009 showed mildly elevated right heart filling pressures but normal PA pressure and normal PCWP. She was started her on a beta blocker (Coreg) to try to lower her LV mid-cavity gradient (that likely occurs with exertion) and to better control BP. V/Q scan was negative for PE. PFTs from 06/2011 showed a restrictive defect. Last echo in 06/2013 showed EF 60-65% with normal RV size and systolic function.   Thursday, she was evaluated in the HF clinic with marked volume overload and LLE erythema. Weight has been trending up despite increasing oral diuretics. Weight at home 276 and trending up to 285 pounds. Complaining of dyspnea with exertion. Increased lower extremity edema. Complaining of LLE erythema and pain over the last week. She continues to eat a lot of ice. Drinking >2 liters of fluid per day. Eating hot dogs. Ambulates with walker.   She diuresed well overnight with IV Lasix.   Filed Vitals:   12/05/13 1700 12/05/13 2149 12/06/13 0156 12/06/13 0545  BP: 142/52 146/68 148/61 153/67  Pulse: 70 74 72 71  Temp: 97.7 F (36.5 C) 97.8 F (36.6 C) 97.4 F (36.3 C) 98.2 F (36.8 C)  TempSrc: Oral Oral Oral Oral  Resp: 20 20 20 18   Height:      Weight:    281 lb  9.6 oz (127.733 kg)  SpO2: 100% 100% 100% 97%    Intake/Output Summary (Last 24 hours) at 12/06/13 0755 Last data filed at 12/06/13 0552  Gross per 24 hour  Intake      0 ml  Output   2500 ml  Net  -2500 ml    LABS: Basic Metabolic Panel:  Recent Labs  12/05/13 1700 12/06/13 0346  NA 142 142  K 3.3* 4.3  CL 94* 96  CO2 33* 34*  GLUCOSE 86 150*  BUN 28* 22  CREATININE 0.84 0.72  CALCIUM 9.5 9.1  MG 2.1  --    Liver Function Tests:  Recent Labs  12/05/13 1700  AST 21  ALT 19  ALKPHOS 53  BILITOT 0.2*  PROT 6.8  ALBUMIN 2.8*   No results found for this basename: LIPASE, AMYLASE,  in the last 72 hours CBC:  Recent Labs  12/05/13 1700  WBC 4.3  NEUTROABS 2.5  HGB 17.4*  HCT 57.3*  MCV 80.4  PLT 132*   Cardiac Enzymes: No results found for this basename: CKTOTAL, CKMB, CKMBINDEX, TROPONINI,  in the last 72 hours BNP: No components found with this basename: POCBNP,  D-Dimer: No results found for this basename: DDIMER,  in the last 72 hours Hemoglobin A1C: No results found for this basename: HGBA1C,  in the last 72 hours Fasting Lipid Panel: No results found for this basename: CHOL, HDL, LDLCALC, TRIG, CHOLHDL, LDLDIRECT,  in the  last 72 hours Thyroid Function Tests:  Recent Labs  12/05/13 1700  TSH 2.010   Anemia Panel: No results found for this basename: VITAMINB12, FOLATE, FERRITIN, TIBC, IRON, RETICCTPCT,  in the last 72 hours  RADIOLOGY: No results found.  PHYSICAL EXAM General: NAD Neck: JVP 14 cm, no thyromegaly or thyroid nodule.  Lungs: Clear to auscultation bilaterally with normal respiratory effort. CV: Nondisplaced PMI.  Heart regular S1/S2, no S3/S4, 1/6 SEM.  2+ edema to knees bilaterally.  No carotid bruit.  Normal pedal pulses.  Abdomen: Soft, nontender, no hepatosplenomegaly, no distention.  Neurologic: Alert and oriented x 3.  Psych: Normal affect. Extremities: No clubbing or cyanosis.  Skin: Erythema anterior lower legs  bilaterally  TELEMETRY: Reviewed telemetry pt in NSR  ASSESSMENT AND PLAN: Melissa Myers was being admitted from the HF clinic with marked volume overload despite escalation of home diuretic regimen (torsemide 80 mg twice a day metolazone 2.5 mg three times a week). Weight up at least 15 pounds. This is likely in part to excessive fluid intake.  1. Acute on chronic diastolic CHF: Patient remains very volume overloaded.  - Continue Lasix IV, 80 mg every 8 hrs and follow creatinine closely.  2. OHS/OSA: Continue oxygen, does not tolerate CPAP.  3. ID: Probably not cellulitis. Afebrile, normal WBCs.  Suspect discoloration due to chronic venous stasis changes.  Can stop vancomycin.   Loralie Champagne 12/06/2013 7:59 AM

## 2013-12-06 NOTE — Plan of Care (Signed)
Problem: Phase I Progression Outcomes Goal: EF % per last Echo/documented,Core Reminder form on chart Outcome: Completed/Met Date Met:  12/06/13 EF=65%

## 2013-12-06 NOTE — Progress Notes (Signed)
  Echocardiogram 2D Echocardiogram has been performed.  Bobbye Charleston 12/06/2013, 12:08 PM

## 2013-12-06 NOTE — Progress Notes (Addendum)
Inpatient Diabetes Program Recommendations  AACE/ADA: New Consensus Statement on Inpatient Glycemic Control (2013)  Target Ranges:  Prepandial:   less than 140 mg/dL      Peak postprandial:   less than 180 mg/dL (1-2 hours)      Critically ill patients:  140 - 180 mg/dL  Results for MADINE, SARR (MRN [id number]) as of 12/06/2013 15:26  Ref. Range 12/06/2013 06:05 12/06/2013 11:06 12/06/2013 13:18 12/06/2013 13:37 12/06/2013 14:57  Glucose-Capillary Latest Range: 70-99 mg/dL 139 (H) 94 69 (L) 89 119 (H)    Inpatient Diabetes Program Recommendations Insulin - Meal Coverage: decrease Novolog to 20 units TID with meals (should not be given if pt eats <50% of meals) Please add carb modified to current low sodium diet.   Note: This Diabetes Coordinator met with patient to discuss DM management at home.  Pt reports having hypoglycemia as recently as last week with CBG in the 50's.  Recommend a reduction of Novolog with meals while hospitalized.  Can be titrated back up if needed.  Pt reports meals here in the hospital are not equal to what she would eat at home. ADDENDUM: spoke with Junie Bame NP about above recommendation.  She will address.  Thank you  Raoul Pitch BSN, RN,CDE Inpatient Diabetes Coordinator [phone] (team pager)

## 2013-12-06 NOTE — Progress Notes (Signed)
CRITICAL VALUE ALERT  Critical value received: CBG 69  Date of notification:  12-06-13  Time of notification:  9030  Critical value read back: yes  Nurse who received alert:  Lonzo Cloud RN  MD notified (1st page):  Deatra Canter PA  Time of first page:  1359  MD notified (2nd page):  Time of second page:  Responding MD:  Deatra Canter PA  Time MD responded:  0923  CBG Re-check is 23. New orders given.

## 2013-12-07 DIAGNOSIS — R601 Generalized edema: Secondary | ICD-10-CM

## 2013-12-07 DIAGNOSIS — D508 Other iron deficiency anemias: Secondary | ICD-10-CM

## 2013-12-07 DIAGNOSIS — I10 Essential (primary) hypertension: Secondary | ICD-10-CM

## 2013-12-07 LAB — BASIC METABOLIC PANEL
Anion gap: 11 (ref 5–15)
BUN: 23 mg/dL (ref 6–23)
CALCIUM: 9.1 mg/dL (ref 8.4–10.5)
CO2: 35 mEq/L — ABNORMAL HIGH (ref 19–32)
Chloride: 98 mEq/L (ref 96–112)
Creatinine, Ser: 0.81 mg/dL (ref 0.50–1.10)
GFR calc Af Amer: 85 mL/min — ABNORMAL LOW (ref >90)
GFR calc non Af Amer: 73 mL/min — ABNORMAL LOW (ref >90)
GLUCOSE: 161 mg/dL — AB (ref 70–99)
Potassium: 3.7 mEq/L (ref 3.7–5.3)
Sodium: 144 mEq/L (ref 137–147)

## 2013-12-07 LAB — CBC
HCT: 35.2 % — ABNORMAL LOW (ref 36.0–46.0)
HCT: 38 % (ref 36.0–46.0)
Hemoglobin: 10.8 g/dL — ABNORMAL LOW (ref 12.0–15.0)
Hemoglobin: 11.9 g/dL — ABNORMAL LOW (ref 12.0–15.0)
MCH: 25.8 pg — ABNORMAL LOW (ref 26.0–34.0)
MCH: 25.6 pg — ABNORMAL LOW (ref 26.0–34.0)
MCHC: 30.7 g/dL (ref 30.0–36.0)
MCHC: 31.3 g/dL (ref 30.0–36.0)
MCV: 84 fL (ref 78.0–100.0)
MCV: 81.7 fL (ref 78.0–100.0)
Platelets: 280 10*3/uL (ref 150–400)
Platelets: 283 10*3/uL (ref 150–400)
RBC: 4.19 MIL/uL (ref 3.87–5.11)
RBC: 4.65 MIL/uL (ref 3.87–5.11)
RDW: 16.5 % — AB (ref 11.5–15.5)
RDW: 16.5 % — ABNORMAL HIGH (ref 11.5–15.5)
WBC: 5.9 10*3/uL (ref 4.0–10.5)
WBC: 6.5 10*3/uL (ref 4.0–10.5)

## 2013-12-07 LAB — GLUCOSE, CAPILLARY
GLUCOSE-CAPILLARY: 230 mg/dL — AB (ref 70–99)
Glucose-Capillary: 173 mg/dL — ABNORMAL HIGH (ref 70–99)
Glucose-Capillary: 109 mg/dL — ABNORMAL HIGH (ref 70–99)
Glucose-Capillary: 145 mg/dL — ABNORMAL HIGH (ref 70–99)

## 2013-12-07 MED ORDER — SPIRONOLACTONE 25 MG PO TABS
25.0000 mg | ORAL_TABLET | Freq: Every day | ORAL | Status: DC
  Administered 2013-12-08 – 2013-12-11 (×4): 25 mg via ORAL
  Filled 2013-12-07 (×4): qty 1

## 2013-12-07 NOTE — Progress Notes (Signed)
Patient ID: Melissa Myers, female   DOB: Apr 12, 1945, 68 y.o.   MRN: [id number]    SUBJECTIVE:  Melissa Myers is a 68 year old with a history of DM, hyperlipidemia, OHS/OSA, obesity, on chronic oxygen at 2 liters [place] continuously, and chronic dyspnea/diastolic CHF.   Patient had an echo in 04/2009 showing moderate LVH and preserved LV systolic function. However, there was a very large LV mid-cavity gradient with valsalva. Patient developed quite significant exertional dyspnea to the point where she was short of breath walking around her house. She had a pulmonary evaluation with Dr. Elsworth Soho but no primary lung problems were identified. Patient had an ETT-myoview in 05/2009 which was negative for ischemia or infarction. Right heart cath in 05/2009 showed mildly elevated right heart filling pressures but normal PA pressure and normal PCWP. She was started her on a beta blocker (Coreg) to try to lower her LV mid-cavity gradient (that likely occurs with exertion) and to better control BP. V/Q scan was negative for PE. PFTs from 06/2011 showed a restrictive defect. Last echo in 06/2013 showed EF 60-65% with normal RV size and systolic function.   Thursday, she was evaluated in the HF clinic with marked volume overload and LLE erythema. Weight has been trending up despite increasing oral diuretics. Weight at home 276 and trending up to 285 pounds. Complaining of dyspnea with exertion. Increased lower extremity edema. Complaining of LLE erythema and pain over the last week. She continues to eat a lot of ice. Drinking >2 liters of fluid per day. Eating hot dogs. Ambulates with walker.   She diuresed well overnight with IV Lasix.   [name] aspirin EC  81 mg Oral Daily  . carvedilol  25 mg Oral BID PC  . cholecalciferol  1,000 Units Oral Daily  . DULoxetine  60 mg Oral Daily  . enoxaparin (LOVENOX) injection  60 mg Subcutaneous QHS  . fenofibrate  160 mg Oral Daily  . furosemide  80 mg Intravenous Q8H  . hydrALAZINE   25 mg Oral 3 times per day  . insulin aspart  0-20 Units Subcutaneous TID WC  . insulin aspart  20 Units Subcutaneous BID WC   And  . insulin aspart  20 Units Subcutaneous QAC supper  . insulin glargine  60 Units Subcutaneous QHS  . magnesium oxide  200 mg Oral Daily  . omega-3 acid ethyl esters  1 g Oral BID  . potassium chloride SA  40 mEq Oral BID  . pregabalin  100 mg Oral TID  . rOPINIRole  2 mg Oral BID  . sodium chloride  3 mL Intravenous Q12H  . spironolactone  12.5 mg Oral Daily    Filed Vitals:   12/06/13 2108 12/07/13 0132 12/07/13 0559 12/07/13 1045  BP: 116/51 139/62 155/77 136/50  Pulse: 66 64 73 74  Temp: 98 F (36.7 C) 97.4 F (36.3 C) 97.8 F (36.6 C)   TempSrc: Oral Oral Oral   Resp: 18 17 18    Height:      Weight:   277 lb 4.8 oz (125.782 kg)   SpO2: 99% 99% 97%     Intake/Output Summary (Last 24 hours) at 12/07/13 1100 Last data filed at 12/07/13 0913  Gross per 24 hour  Intake   1331 ml  Output   2300 ml  Net   -969 ml   LABS: Basic Metabolic Panel:  Recent Labs  12/05/13 1700 12/06/13 0346 12/07/13 0440  NA 142 142 144  K 3.3* 4.3  3.7  CL 94* 96 98  CO2 33* 34* 35*  GLUCOSE 86 150* 161*  BUN 28* 22 23  CREATININE 0.84 0.72 0.81  CALCIUM 9.5 9.1 9.1  MG 2.1  --   --    Liver Function Tests:  Recent Labs  12/05/13 1700  AST 21  ALT 19  ALKPHOS 53  BILITOT 0.2*  PROT 6.8  ALBUMIN 2.8*    Recent Labs  12/05/13 1700 12/07/13 0440  WBC 4.3 5.9  NEUTROABS 2.5  --   HGB 17.4* 10.8*  HCT 57.3* 35.2*  MCV 80.4 84.0  PLT 132* 280    Recent Labs  12/05/13 1700  HGBA1C 7.1*    Recent Labs  12/05/13 1700  TSH 2.010   RADIOLOGY: No results found.  PHYSICAL EXAM General: NAD Neck: JVP 14 cm, no thyromegaly or thyroid nodule.  Lungs: Clear to auscultation bilaterally with normal respiratory effort. CV: Nondisplaced PMI.  Heart regular S1/S2, no S3/S4, 1/6 SEM.  2+ edema to knees bilaterally.  No carotid bruit.   Normal pedal pulses.  Abdomen: Soft, nontender, no hepatosplenomegaly, no distention.  Neurologic: Alert and oriented x 3.  Psych: Normal affect. Extremities: No clubbing or cyanosis.  Skin: Erythema anterior lower legs bilaterally  TELEMETRY: Reviewed telemetry pt in NSR    ASSESSMENT AND PLAN:  Melissa Melissa Myers was being admitted from the HF clinic with marked volume overload despite escalation of home diuretic regimen (torsemide 80 mg twice a day metolazone 2.5 mg three times a week). Weight up at least 15 pounds. This is likely in part to excessive fluid intake.   1. Acute on chronic diastolic CHF: Patient remains volume overloaded.  - Continue Lasix IV, 80 mg every 8 hrs and follow creatinine closely for 1 more day and then switch to PO meds.   2. OHS/OSA: Continue oxygen, does not tolerate CPAP.   3. ID: Probably not cellulitis. Afebrile, normal WBCs.  Suspect discoloration due to chronic venous stasis changes.  Can stop vancomycin.   4. Anemia - sudden drop in Hb in the last 2 days, no obvious bleeding, we will recheck to make sure its not lab error and if abnormal investigate.  5 . Hypertension - borderline - increase spironolactone to 25 mg po daily   Yetunde Leis H 12/07/2013 11:00 AM

## 2013-12-08 LAB — BASIC METABOLIC PANEL
Anion gap: 11 (ref 5–15)
BUN: 20 mg/dL (ref 6–23)
CHLORIDE: 99 meq/L (ref 96–112)
CO2: 35 meq/L — AB (ref 19–32)
CREATININE: 0.83 mg/dL (ref 0.50–1.10)
Calcium: 9.3 mg/dL (ref 8.4–10.5)
GFR calc non Af Amer: 71 mL/min — ABNORMAL LOW (ref >90)
GFR, EST AFRICAN AMERICAN: 82 mL/min — AB (ref >90)
Glucose, Bld: 100 mg/dL — ABNORMAL HIGH (ref 70–99)
POTASSIUM: 3.7 meq/L (ref 3.7–5.3)
Sodium: 145 mEq/L (ref 137–147)

## 2013-12-08 LAB — GLUCOSE, CAPILLARY
GLUCOSE-CAPILLARY: 103 mg/dL — AB (ref 70–99)
GLUCOSE-CAPILLARY: 133 mg/dL — AB (ref 70–99)
Glucose-Capillary: 186 mg/dL — ABNORMAL HIGH (ref 70–99)
Glucose-Capillary: 176 mg/dL — ABNORMAL HIGH (ref 70–99)

## 2013-12-08 NOTE — Progress Notes (Signed)
Patient ID: Melissa Myers, female   DOB: 13-Feb-1946, 68 y.o.   MRN: [id number]   SUBJECTIVE:  Melissa Myers is a 68 year old with a history of DM, hyperlipidemia, OHS/OSA, obesity, on chronic oxygen at 2 liters [place] continuously, and chronic dyspnea/diastolic CHF.   Patient had an echo in 04/2009 showing moderate LVH and preserved LV systolic function. However, there was a very large LV mid-cavity gradient with valsalva. Patient developed quite significant exertional dyspnea to the point where she was short of breath walking around her house. She had a pulmonary evaluation with Dr. Elsworth Soho but no primary lung problems were identified. Patient had an ETT-myoview in 05/2009 which was negative for ischemia or infarction. Right heart cath in 05/2009 showed mildly elevated right heart filling pressures but normal PA pressure and normal PCWP. She was started her on a beta blocker (Coreg) to try to lower her LV mid-cavity gradient (that likely occurs with exertion) and to better control BP. V/Q scan was negative for PE. PFTs from 06/2011 showed a restrictive defect. Last echo in 06/2013 showed EF 60-65% with normal RV size and systolic function.   Thursday, she was evaluated in the HF clinic with marked volume overload and LLE erythema. Weight has been trending up despite increasing oral diuretics. Weight at home 276 and trending up to 285 pounds. Complaining of dyspnea with exertion. Increased lower extremity edema. Complaining of LLE erythema and pain over the last week. She continues to eat a lot of ice. Drinking >2 liters of fluid per day. Eating hot dogs. Ambulates with walker.   She diuresed well overnight with IV Lasix, weight down 5 lbs.   Filed Vitals:   12/07/13 1338 12/07/13 1511 12/07/13 2156 12/08/13 0531  BP: 115/50 137/50 128/43 117/73  Pulse:  64 60 59  Temp:  98.1 F (36.7 C) 97.4 F (36.3 C) 97.6 F (36.4 C)  TempSrc:  Oral Oral Oral  Resp:   20 20  Height:      Weight:    272 lb 9.6 oz  (123.651 kg)  SpO2:  99% 100% 100%    Intake/Output Summary (Last 24 hours) at 12/08/13 0858 Last data filed at 12/08/13 0534  Gross per 24 hour  Intake    600 ml  Output   4750 ml  Net  -4150 ml    LABS: Basic Metabolic Panel:  Recent Labs  12/05/13 1700  12/07/13 0440 12/08/13 0345  NA 142  < > 144 145  K 3.3*  < > 3.7 3.7  CL 94*  < > 98 99  CO2 33*  < > 35* 35*  GLUCOSE 86  < > 161* 100*  BUN 28*  < > 23 20  CREATININE 0.84  < > 0.81 0.83  CALCIUM 9.5  < > 9.1 9.3  MG 2.1  --   --   --   < > = values in this interval not displayed. Liver Function Tests:  Recent Labs  12/05/13 1700  AST 21  ALT 19  ALKPHOS 53  BILITOT 0.2*  PROT 6.8  ALBUMIN 2.8*   No results found for this basename: LIPASE, AMYLASE,  in the last 72 hours CBC:  Recent Labs  12/05/13 1700 12/07/13 0440 12/07/13 1330  WBC 4.3 5.9 6.5  NEUTROABS 2.5  --   --   HGB 17.4* 10.8* 11.9*  HCT 57.3* 35.2* 38.0  MCV 80.4 84.0 81.7  PLT 132* 280 283   Cardiac Enzymes: No results found for  this basename: CKTOTAL, CKMB, CKMBINDEX, TROPONINI,  in the last 72 hours BNP: No components found with this basename: POCBNP,  D-Dimer: No results found for this basename: DDIMER,  in the last 72 hours Hemoglobin A1C:  Recent Labs  12/05/13 1700  HGBA1C 7.1*   Fasting Lipid Panel: No results found for this basename: CHOL, HDL, LDLCALC, TRIG, CHOLHDL, LDLDIRECT,  in the last 72 hours Thyroid Function Tests:  Recent Labs  12/05/13 1700  TSH 2.010   Anemia Panel: No results found for this basename: VITAMINB12, FOLATE, FERRITIN, TIBC, IRON, RETICCTPCT,  in the last 72 hours  RADIOLOGY: No results found.  PHYSICAL EXAM General: NAD Neck: JVP 12-14 cm, no thyromegaly or thyroid nodule.  Lungs: Clear to auscultation bilaterally with normal respiratory effort. CV: Nondisplaced PMI.  Heart regular S1/S2, no S3/S4, 2/6 SEM.  1+ edema to knees bilaterally.  No carotid bruit.  Normal pedal pulses.   Abdomen: Soft, nontender, no hepatosplenomegaly, no distention.  Neurologic: Alert and oriented x 3.  Psych: Normal affect. Extremities: No clubbing or cyanosis.  Skin: Erythema anterior lower legs bilaterally  TELEMETRY: Reviewed telemetry pt in NSR  ASSESSMENT AND PLAN: Melissa Myers was being admitted from the HF clinic with marked volume overload despite escalation of home diuretic regimen (torsemide 80 mg twice a day metolazone 2.5 mg three times a week). Weight up at least 15 pounds. This is likely in part to excessive fluid intake.  1. Acute on chronic diastolic CHF: Patient remains very volume overloaded.  She continues to diurese well, weight down 5 lbs, creatinine stable. - Continue Lasix IV, 80 mg every 8 hrs and follow creatinine closely.  Will likely need a couple more days IV diuresis.  2. OHS/OSA: Continue oxygen, does not tolerate CPAP.  3. ID: Probably not cellulitis. Afebrile, normal WBCs.  Suspect discoloration due to chronic venous stasis changes. 4. Anemia: I think that her admission CBC was spurious.  Hgb has been at her baseline level since then.    Loralie Champagne 12/08/2013 8:58 AM

## 2013-12-09 ENCOUNTER — Ambulatory Visit: Payer: Medicare Other | Admitting: Podiatry

## 2013-12-09 LAB — BASIC METABOLIC PANEL
Anion gap: 10 (ref 5–15)
BUN: 24 mg/dL — ABNORMAL HIGH (ref 6–23)
CALCIUM: 9.6 mg/dL (ref 8.4–10.5)
CO2: 35 meq/L — AB (ref 19–32)
CREATININE: 0.86 mg/dL (ref 0.50–1.10)
Chloride: 98 mEq/L (ref 96–112)
GFR calc Af Amer: 79 mL/min — ABNORMAL LOW (ref >90)
GFR, EST NON AFRICAN AMERICAN: 68 mL/min — AB (ref >90)
Glucose, Bld: 103 mg/dL — ABNORMAL HIGH (ref 70–99)
Potassium: 3.9 mEq/L (ref 3.7–5.3)
Sodium: 143 mEq/L (ref 137–147)

## 2013-12-09 LAB — CBC
HCT: 35.5 % — ABNORMAL LOW (ref 36.0–46.0)
Hemoglobin: 11.2 g/dL — ABNORMAL LOW (ref 12.0–15.0)
MCH: 25.6 pg — ABNORMAL LOW (ref 26.0–34.0)
MCHC: 31.5 g/dL (ref 30.0–36.0)
MCV: 81.2 fL (ref 78.0–100.0)
PLATELETS: 270 10*3/uL (ref 150–400)
RBC: 4.37 MIL/uL (ref 3.87–5.11)
RDW: 16.5 % — AB (ref 11.5–15.5)
WBC: 6.6 10*3/uL (ref 4.0–10.5)

## 2013-12-09 LAB — GLUCOSE, CAPILLARY
GLUCOSE-CAPILLARY: 106 mg/dL — AB (ref 70–99)
Glucose-Capillary: 104 mg/dL — ABNORMAL HIGH (ref 70–99)
Glucose-Capillary: 121 mg/dL — ABNORMAL HIGH (ref 70–99)
Glucose-Capillary: 223 mg/dL — ABNORMAL HIGH (ref 70–99)

## 2013-12-09 MED ORDER — METOLAZONE 2.5 MG PO TABS
2.5000 mg | ORAL_TABLET | Freq: Once | ORAL | Status: AC
  Administered 2013-12-09: 2.5 mg via ORAL
  Filled 2013-12-09: qty 1

## 2013-12-09 MED ORDER — POTASSIUM CHLORIDE CRYS ER 20 MEQ PO TBCR
40.0000 meq | EXTENDED_RELEASE_TABLET | Freq: Once | ORAL | Status: AC
  Administered 2013-12-09: 40 meq via ORAL

## 2013-12-09 NOTE — Progress Notes (Signed)
Patient ID: Melissa Myers, female   DOB: Jul 24, 1945, 68 y.o.   MRN: [id number]   SUBJECTIVE:  Melissa Myers is a 68 year old with a history of DM, hyperlipidemia, OHS/OSA, obesity, on chronic oxygen at 2 liters [place] continuously, and chronic dyspnea/diastolic CHF.   Patient had an echo in 04/2009 showing moderate LVH and preserved LV systolic function. However, there was a very large LV mid-cavity gradient with valsalva. Patient developed quite significant exertional dyspnea to the point where she was short of breath walking around her house. She had a pulmonary evaluation with Dr. Elsworth Soho but no primary lung problems were identified. Patient had an ETT-myoview in 05/2009 which was negative for ischemia or infarction. Right heart cath in 05/2009 showed mildly elevated right heart filling pressures but normal PA pressure and normal PCWP. She was started her on a beta blocker (Coreg) to try to lower her LV mid-cavity gradient (that likely occurs with exertion) and to better control BP. V/Q scan was negative for PE. PFTs from 06/2011 showed a restrictive defect. Last echo in 06/2013 showed EF 60-65% with normal RV size and systolic function.   Thursday, she was evaluated in the HF clinic with marked volume overload and LLE erythema. Weight has been trending up despite increasing oral diuretics. Weight at home 276 and trending up to 285 pounds. Complaining of dyspnea with exertion. Increased lower extremity edema. Complaining of LLE erythema and pain over the last week. She continues to eat a lot of ice. Drinking >2 liters of fluid per day. Eating hot dogs. Ambulates with walker.   Diuresis not as vigorous, weight still down and creatinine stable.    Filed Vitals:   12/08/13 1521 12/08/13 2147 12/09/13 0543 12/09/13 0548  BP: 139/53 135/47 148/61   Pulse: 64 65 70   Temp: 97.8 F (36.6 C) 98.3 F (36.8 C) 98 F (36.7 C)   TempSrc: Oral Oral Oral   Resp:  18 18   Height:      Weight:    268 lb 1.6 oz  (121.609 kg)  SpO2: 97% 98% 94%     Intake/Output Summary (Last 24 hours) at 12/09/13 0728 Last data filed at 12/09/13 0548  Gross per 24 hour  Intake    720 ml  Output   1750 ml  Net  -1030 ml    LABS: Basic Metabolic Panel:  Recent Labs  12/08/13 0345 12/09/13 0435  NA 145 143  K 3.7 3.9  CL 99 98  CO2 35* 35*  GLUCOSE 100* 103*  BUN 20 24*  CREATININE 0.83 0.86  CALCIUM 9.3 9.6   Liver Function Tests: No results found for this basename: AST, ALT, ALKPHOS, BILITOT, PROT, ALBUMIN,  in the last 72 hours No results found for this basename: LIPASE, AMYLASE,  in the last 72 hours CBC:  Recent Labs  12/07/13 1330 12/09/13 0435  WBC 6.5 6.6  HGB 11.9* 11.2*  HCT 38.0 35.5*  MCV 81.7 81.2  PLT 283 270   Cardiac Enzymes: No results found for this basename: CKTOTAL, CKMB, CKMBINDEX, TROPONINI,  in the last 72 hours BNP: No components found with this basename: POCBNP,  D-Dimer: No results found for this basename: DDIMER,  in the last 72 hours Hemoglobin A1C: No results found for this basename: HGBA1C,  in the last 72 hours Fasting Lipid Panel: No results found for this basename: CHOL, HDL, LDLCALC, TRIG, CHOLHDL, LDLDIRECT,  in the last 72 hours Thyroid Function Tests: No results found for this  basename: TSH, T4TOTAL, FREET3, T3FREE, THYROIDAB,  in the last 72 hours Anemia Panel: No results found for this basename: VITAMINB12, FOLATE, FERRITIN, TIBC, IRON, RETICCTPCT,  in the last 72 hours  RADIOLOGY: No results found.  PHYSICAL EXAM General: NAD Neck: JVP 12-14 cm, no thyromegaly or thyroid nodule.  Lungs: Clear to auscultation bilaterally with normal respiratory effort. CV: Nondisplaced PMI.  Heart regular S1/S2, no S3/S4, 2/6 SEM.  1+ edema to knees bilaterally.  No carotid bruit.  Normal pedal pulses.  Abdomen: Soft, nontender, no hepatosplenomegaly, no distention.  Neurologic: Alert and oriented x 3.  Psych: Normal affect. Extremities: No clubbing or  cyanosis.  Skin: Erythema anterior lower legs bilaterally  TELEMETRY: Reviewed telemetry pt in NSR  ASSESSMENT AND PLAN: Melissa Robertion was being admitted from the HF clinic with marked volume overload despite escalation of home diuretic regimen (torsemide 80 mg twice a day metolazone 2.5 mg three times a week). Weight up at least 15 pounds. This is likely in part to excessive fluid intake.  1. Acute on chronic diastolic CHF: Patient remains volume overloaded.  Weight down and creatinine stable but did not diurese well yesterday.  - Continue Lasix IV, 80 mg every 8 hrs and follow creatinine closely.   - Will give a dose of metolazone today.  2. OHS/OSA: Continue oxygen, does not tolerate CPAP.  3. ID: Probably not cellulitis. Afebrile, normal WBCs.  Suspect discoloration due to chronic venous stasis changes. 4. Anemia: I think that her admission CBC was spurious.  Hgb has been at her baseline level since then.    Loralie Champagne 12/09/2013 7:28 AM

## 2013-12-10 LAB — GLUCOSE, CAPILLARY
GLUCOSE-CAPILLARY: 212 mg/dL — AB (ref 70–99)
Glucose-Capillary: 110 mg/dL — ABNORMAL HIGH (ref 70–99)
Glucose-Capillary: 106 mg/dL — ABNORMAL HIGH (ref 70–99)
Glucose-Capillary: 92 mg/dL (ref 70–99)

## 2013-12-10 LAB — BASIC METABOLIC PANEL
Anion gap: 14 (ref 5–15)
BUN: 30 mg/dL — ABNORMAL HIGH (ref 6–23)
CHLORIDE: 93 meq/L — AB (ref 96–112)
CO2: 33 meq/L — AB (ref 19–32)
Calcium: 10.1 mg/dL (ref 8.4–10.5)
Creatinine, Ser: 0.91 mg/dL (ref 0.50–1.10)
GFR calc Af Amer: 73 mL/min — ABNORMAL LOW (ref >90)
GFR, EST NON AFRICAN AMERICAN: 63 mL/min — AB (ref >90)
GLUCOSE: 97 mg/dL (ref 70–99)
Potassium: 3.6 mEq/L — ABNORMAL LOW (ref 3.7–5.3)
SODIUM: 140 meq/L (ref 137–147)

## 2013-12-10 MED ORDER — POTASSIUM CHLORIDE CRYS ER 20 MEQ PO TBCR
40.0000 meq | EXTENDED_RELEASE_TABLET | Freq: Once | ORAL | Status: AC
Start: 2013-12-10 — End: 2013-12-10
  Administered 2013-12-10: 40 meq via ORAL

## 2013-12-10 NOTE — Progress Notes (Signed)
Patient ID: Melissa Myers, female   DOB: 1945-08-25, 69 y.o.   MRN: [id number]   SUBJECTIVE:  Melissa Myers is a 68 year old with a history of DM, hyperlipidemia, OHS/OSA, obesity, on chronic oxygen at 2 liters [place] continuously, and chronic dyspnea/diastolic CHF.   Patient had an echo in 04/2009 showing moderate LVH and preserved LV systolic function. However, there was a very large LV mid-cavity gradient with valsalva. Patient developed quite significant exertional dyspnea to the point where she was short of breath walking around her house. She had a pulmonary evaluation with Dr. Elsworth Soho but no primary lung problems were identified. Patient had an ETT-myoview in 05/2009 which was negative for ischemia or infarction. Right heart cath in 05/2009 showed mildly elevated right heart filling pressures but normal PA pressure and normal PCWP. She was started her on a beta blocker (Coreg) to try to lower her LV mid-cavity gradient (that likely occurs with exertion) and to better control BP. V/Q scan was negative for PE. PFTs from 06/2011 showed a restrictive defect. Last echo in 06/2013 showed EF 60-65% with normal RV size and systolic function.   Thursday, she was evaluated in the HF clinic with marked volume overload and LLE erythema. Weight has been trending up despite increasing oral diuretics. Weight at home 276 and trending up to 285 pounds. Complaining of dyspnea with exertion. Increased lower extremity edema. Complaining of LLE erythema and pain over the last week. She continues to eat a lot of ice. Drinking >2 liters of fluid per day. Eating hot dogs. Ambulates with walker.   She continues to diurese with IV lasix + metolazone. Weight down another pounds. Feeling better.    Scheduled Meds: . aspirin EC  81 mg Oral Daily  . carvedilol  25 mg Oral BID PC  . cholecalciferol  1,000 Units Oral Daily  . DULoxetine  60 mg Oral Daily  . enoxaparin (LOVENOX) injection  60 mg Subcutaneous QHS  . fenofibrate  160  mg Oral Daily  . furosemide  80 mg Intravenous Q8H  . hydrALAZINE  25 mg Oral 3 times per day  . insulin aspart  0-20 Units Subcutaneous TID WC  . insulin aspart  20 Units Subcutaneous BID WC   And  . insulin aspart  20 Units Subcutaneous QAC supper  . insulin glargine  60 Units Subcutaneous QHS  . magnesium oxide  200 mg Oral Daily  . omega-3 acid ethyl esters  1 g Oral BID  . potassium chloride SA  40 mEq Oral BID  . pregabalin  100 mg Oral TID  . rOPINIRole  2 mg Oral BID  . sodium chloride  3 mL Intravenous Q12H  . spironolactone  25 mg Oral Daily   Continuous Infusions:  PRN Meds:.sodium chloride, acetaminophen, ondansetron (ZOFRAN) IV, oxyCODONE-acetaminophen, sodium chloride   Filed Vitals:   12/09/13 0548 12/09/13 1420 12/09/13 2056 12/10/13 0602  BP:  122/43 143/71 133/57  Pulse:  63 68 60  Temp:  97.9 F (36.6 C)  98.1 F (36.7 C)  TempSrc:  Oral  Oral  Resp:  18 18 17   Height:      Weight: 268 lb 1.6 oz (121.609 kg)   267 lb 13.7 oz (121.5 kg)  SpO2:  93% 96% 97%    Intake/Output Summary (Last 24 hours) at 12/10/13 0701 Last data filed at 12/10/13 2585  Gross per 24 hour  Intake   1443 ml  Output   4625 ml  Net  -3182 ml  LABS: Basic Metabolic Panel:  Recent Labs  12/09/13 0435 12/10/13 0459  NA 143 140  K 3.9 3.6*  CL 98 93*  CO2 35* 33*  GLUCOSE 103* 97  BUN 24* 30*  CREATININE 0.86 0.91  CALCIUM 9.6 10.1   Liver Function Tests: No results found for this basename: AST, ALT, ALKPHOS, BILITOT, PROT, ALBUMIN,  in the last 72 hours No results found for this basename: LIPASE, AMYLASE,  in the last 72 hours CBC:  Recent Labs  12/07/13 1330 12/09/13 0435  WBC 6.5 6.6  HGB 11.9* 11.2*  HCT 38.0 35.5*  MCV 81.7 81.2  PLT 283 270   Cardiac Enzymes: No results found for this basename: CKTOTAL, CKMB, CKMBINDEX, TROPONINI,  in the last 72 hours BNP: No components found with this basename: POCBNP,  D-Dimer: No results found for this  basename: DDIMER,  in the last 72 hours Hemoglobin A1C: No results found for this basename: HGBA1C,  in the last 72 hours Fasting Lipid Panel: No results found for this basename: CHOL, HDL, LDLCALC, TRIG, CHOLHDL, LDLDIRECT,  in the last 72 hours Thyroid Function Tests: No results found for this basename: TSH, T4TOTAL, FREET3, T3FREE, THYROIDAB,  in the last 72 hours Anemia Panel: No results found for this basename: VITAMINB12, FOLATE, FERRITIN, TIBC, IRON, RETICCTPCT,  in the last 72 hours  RADIOLOGY: No results found.  PHYSICAL EXAM General: NAD Neck: JVP  To jaw no thyromegaly or thyroid nodule.  Lungs: Clear to auscultation bilaterally with normal respiratory effort. CV: Nondisplaced PMI.  Heart regular S1/S2, no S3/S4, 2/6 SEM.  Trace-1+ edema to knees bilaterally.  No carotid bruit.  Normal pedal pulses.  Abdomen: obesesSoft, nontender, no hepatosplenomegaly, no distention.  Neurologic: Alert and oriented x 3.  Psych: Normal affect. Extremities: No clubbing or cyanosis.  Skin: Erythema anterior lower legs bilaterally  TELEMETRY: Reviewed telemetry pt in NSR  ASSESSMENT AND PLAN: Melissa Myers was being admitted from the HF clinic with marked volume overload despite escalation of home diuretic regimen (torsemide 80 mg twice a day metolazone 2.5 mg three times a week). Weight up at least 15 pounds. This is likely in part to excessive fluid intake.  1. Acute on chronic diastolic CHF: Patient remains volume overloaded.  Weight down and creatinine stable. BUN up a little.    - Continue Lasix IV, 80 mg every 8 hrs and 2.5 metolazone for one more day. Given an extra 40 meq of potassium  2. OHS/OSA: Continue oxygen, does not tolerate CPAP.  3. ID: Probably not cellulitis. Afebrile, normal WBCs.  Suspect discoloration due to chronic venous stasis changes. 4. Anemia: CBC was spurious.  Hgb has been at her baseline level since then.    Consult cardiac rehab.   CLEGG,AMY NP-C    12/10/2013 7:01 AM  Patient seen with NP, agree with the above note.  She diuresed well yesterday.  She is still volume overloaded on exam.  Continue current diuretics today.  Loralie Champagne 12/10/2013 10:11 AM

## 2013-12-10 NOTE — Evaluation (Signed)
Physical Therapy Evaluation Patient Details Name: Melissa Myers MRN: [id number] DOB: 1945-12-07 Today's Date: 12/10/2013   History of Present Illness  Melissa Myers is a 68 year old with a history of DM, hyperlipidemia, OHS/OSA, obesity, on chronic oxygen at 2 liters [place] continuously, and chronic dyspnea/diastolic CHF who presented to HF clinic with marked volume overload and LLE erythema.  Clinical Impression  Pt admitted with acute diastolic heart failure. Pt currently with functional limitations due to the deficits listed below (see PT Problem List).  Pt will benefit from skilled PT to increase their independence and safety with mobility to allow discharge to the venue listed below. Pt moving well overall but with limited distance.  Pt also reports she has a lot of difficulty managing the 1 step to enter her home.    Follow Up Recommendations Home health PT    Equipment Recommendations  None recommended by PT    Recommendations for Other Services       Precautions / Restrictions Precautions Precautions: None      Mobility  Bed Mobility Overal bed mobility: Needs Assistance Bed Mobility: Supine to Sit     Supine to sit: Supervision;HOB elevated        Transfers Overall transfer level: Modified independent Equipment used: Rolling walker (2 wheeled)             General transfer comment: good use of UE  Ambulation/Gait Ambulation/Gait assistance: Supervision;Min guard Ambulation Distance (Feet): 70 Feet Assistive device: Rolling walker (2 wheeled) Gait Pattern/deviations: Decreased step length - right;Decreased step length - left     General Gait Details: Pt only agreeable to ambulate in room so did several "laps".  Pt demonstrated good safety with 02 tubing.  Stairs            Wheelchair Mobility    Modified Rankin (Stroke Patients Only)       Balance                                             Pertinent Vitals/Pain  Pain Assessment: No/denies pain    Home Living Family/patient expects to be discharged to:: Private residence Living Arrangements: Spouse/significant other;Parent Available Help at Discharge: Family;Available PRN/intermittently Type of Home: House Home Access: Stairs to enter Entrance Stairs-Rails: None Entrance Stairs-Number of Steps: 1 Home Layout: One level Home Equipment: Walker - 2 wheels;Bedside commode;Shower seat - built in;Wheelchair - manual Additional Comments: oxygen    Prior Function Level of Independence: Independent with assistive device(s)               Hand Dominance   Dominant Hand: Right    Extremity/Trunk Assessment   Upper Extremity Assessment: Overall WFL for tasks assessed           Lower Extremity Assessment: Overall WFL for tasks assessed;Generalized weakness      Cervical / Trunk Assessment: Normal  Communication   Communication: No difficulties  Cognition Arousal/Alertness: Awake/alert Behavior During Therapy: WFL for tasks assessed/performed Overall Cognitive Status: Within Functional Limits for tasks assessed                      General Comments      Exercises General Exercises - Lower Extremity Ankle Circles/Pumps: Both;5 reps;Seated Gluteal Sets: 5 reps;Seated Long Arc Quad: Both;5 reps;Seated Hip ABduction/ADduction: Both;5 reps;Seated Hip Flexion/Marching: Both;5 reps;Seated  Assessment/Plan    PT Assessment Patient needs continued PT services  PT Diagnosis Generalized weakness;Difficulty walking   PT Problem List Decreased strength;Decreased balance;Decreased mobility  PT Treatment Interventions Gait training;Stair training;Functional mobility training;Therapeutic activities;Therapeutic exercise;Balance training   PT Goals (Current goals can be found in the Care Plan section) Acute Rehab PT Goals Patient Stated Goal: Go home and lose the extra fluid she has on her. PT Goal Formulation: With  patient Time For Goal Achievement: 12/17/13 Potential to Achieve Goals: Good    Frequency Min 3X/week   Barriers to discharge        Co-evaluation               End of Session Equipment Utilized During Treatment: Gait belt Activity Tolerance: Patient tolerated treatment well Patient left: in chair;with call bell/phone within reach Nurse Communication: Mobility status         Time: 4403-4742 PT Time Calculation (min): 23 min   Charges:   PT Evaluation $Initial PT Evaluation Tier I: 1 Procedure PT Treatments $Gait Training: 8-22 mins   PT G Codes:          Melissa Myers 12/10/2013, 9:48 AM

## 2013-12-10 NOTE — Progress Notes (Signed)
Patient evaluated for community based chronic disease management services with [facility] as a benefit of patient's [employer]. Spoke with patient at bedside to explain [facility] services.  Services have been accepted with written consent.  Patient has authorized her spouse Melissa Myers as a contact.  Patient will receive a post discharge transition of care call and will be evaluated for monthly home visits for assessments and CHF disease process education.  Patient admits that when she has noted weight gains at home she becomes frustrated and will not continue to weigh consistently.  She admits that she may not more education on her diet.  Left contact information and [facility] literature at bedside. Made Inpatient Case Manager aware that [facility] following. Of note, [facility] Care Management services does not replace or interfere with any services that are arranged by inpatient case management or social work.  For additional questions or referrals please contact Corliss Blacker BSN RN [facility] Liaison at [phone].

## 2013-12-10 NOTE — Progress Notes (Signed)
Nutrition Education Note  RD consulted for nutrition education regarding new onset CHF.  RD provided "Heart Failure Nutrition Therapy" handout from the [employer]. Reviewed patient's dietary recall. Provided examples on ways to decrease sodium intake in diet. Discouraged intake of processed foods and use of salt shaker. Encouraged fresh fruits and vegetables as well as whole grain sources of carbohydrates to maximize fiber intake.   RD discussed why it is important for patient to adhere to diet recommendations, and emphasized the role of fluids, foods to avoid, and importance of weighing self daily. Teach back method used. Pt reports she will stop eating or decrease her intake of bacon, hot dogs, deli meats, and salt.   Expect good compliance.  Body mass index is 54.07 kg/(m^2). Pt meets criteria for Morbid Obesity based on current BMI.  Current diet order is 2 Gram Sodium, patient is consuming approximately 100% of meals at this time. Labs and medications reviewed. No further nutrition interventions warranted at this time. RD contact information provided. If additional nutrition issues arise, please re-consult RD.   Pryor Ochoa RD, LDN Inpatient Clinical Dietitian Pager: [phone] After Hours Pager: [phone]

## 2013-12-11 LAB — GLUCOSE, CAPILLARY
Glucose-Capillary: 134 mg/dL — ABNORMAL HIGH (ref 70–99)
Glucose-Capillary: 269 mg/dL — ABNORMAL HIGH (ref 70–99)

## 2013-12-11 LAB — BASIC METABOLIC PANEL
Anion gap: 15 (ref 5–15)
BUN: 36 mg/dL — ABNORMAL HIGH (ref 6–23)
CALCIUM: 9.9 mg/dL (ref 8.4–10.5)
CO2: 34 mEq/L — ABNORMAL HIGH (ref 19–32)
Chloride: 90 mEq/L — ABNORMAL LOW (ref 96–112)
Creatinine, Ser: 1.05 mg/dL (ref 0.50–1.10)
GFR, EST AFRICAN AMERICAN: 62 mL/min — AB (ref >90)
GFR, EST NON AFRICAN AMERICAN: 53 mL/min — AB (ref >90)
GLUCOSE: 203 mg/dL — AB (ref 70–99)
Potassium: 4.5 mEq/L (ref 3.7–5.3)
Sodium: 139 mEq/L (ref 137–147)

## 2013-12-11 MED ORDER — SPIRONOLACTONE 25 MG PO TABS: 25.0000 mg | ORAL_TABLET | Freq: Every day | ORAL | Status: DC

## 2013-12-11 NOTE — Progress Notes (Signed)
[id number] Cardiac Rehab Pt for discharge. Completed CHF education with pt. She has CHF packet at home. She understands CHF zones. She did not know how many mg of sodium she she should have in 24 hours. Pt did know her fluid restriction number. We discussed diabetic and low sodium diet. I gave pt exercise guidelines and encouraged her to find some exercise equipment that she could use at home. Pt voices understanding. I placed CHF video for pt to watch. Pt seems motivated to making better diet choices, watching her fluid intake and get started doing some regular exercise. Deon Pilling, RN 12/11/2013 11:52 AM

## 2013-12-11 NOTE — Progress Notes (Signed)
Discharged home. Discharge instructions given . Patient verbalizes understanding.

## 2013-12-11 NOTE — Progress Notes (Signed)
Patient ID: Melissa Myers, female   DOB: 1945/10/29, 68 y.o.   MRN: [id number]   SUBJECTIVE:  Melissa Myers is a 68 year old with a history of DM, hyperlipidemia, OHS/OSA, obesity, on chronic oxygen at 2 liters [place] continuously, and chronic dyspnea/diastolic CHF.   Patient had an echo in 04/2009 showing moderate LVH and preserved LV systolic function. However, there was a very large LV mid-cavity gradient with valsalva. Patient developed quite significant exertional dyspnea to the point where she was short of breath walking around her house. She had a pulmonary evaluation with Dr. Elsworth Soho but no primary lung problems were identified. Patient had an ETT-myoview in 05/2009 which was negative for ischemia or infarction. Right heart cath in 05/2009 showed mildly elevated right heart filling pressures but normal PA pressure and normal PCWP. She was started her on a beta blocker (Coreg) to try to lower her LV mid-cavity gradient (that likely occurs with exertion) and to better control BP. V/Q scan was negative for PE. PFTs from 06/2011 showed a restrictive defect. Last echo in 06/2013 showed EF 60-65% with normal RV size and systolic function.   Thursday, she was evaluated in the HF clinic with marked volume overload and LLE erythema. Weight has been trending up despite increasing oral diuretics. Weight at home 276 and trending up to 285 pounds. Complaining of dyspnea with exertion. Increased lower extremity edema. Complaining of LLE erythema and pain over the last week. She continues to eat a lot of ice. Drinking >2 liters of fluid per day. Eating hot dogs. Ambulates with walker.   She continues to diurese with IV lasix + metolazone. Weight down 4 more pounds. Feeling better.    Scheduled Meds: . aspirin EC  81 mg Oral Daily  . carvedilol  25 mg Oral BID PC  . cholecalciferol  1,000 Units Oral Daily  . DULoxetine  60 mg Oral Daily  . enoxaparin (LOVENOX) injection  60 mg Subcutaneous QHS  . fenofibrate  160  mg Oral Daily  . hydrALAZINE  25 mg Oral 3 times per day  . insulin aspart  0-20 Units Subcutaneous TID WC  . insulin aspart  20 Units Subcutaneous BID WC   And  . insulin aspart  20 Units Subcutaneous QAC supper  . insulin glargine  60 Units Subcutaneous QHS  . magnesium oxide  200 mg Oral Daily  . omega-3 acid ethyl esters  1 g Oral BID  . potassium chloride SA  40 mEq Oral BID  . pregabalin  100 mg Oral TID  . rOPINIRole  2 mg Oral BID  . sodium chloride  3 mL Intravenous Q12H  . spironolactone  25 mg Oral Daily   Continuous Infusions:  PRN Meds:.sodium chloride, acetaminophen, ondansetron (ZOFRAN) IV, oxyCODONE-acetaminophen, sodium chloride   Filed Vitals:   12/10/13 2110 12/11/13 0603 12/11/13 0605 12/11/13 0610  BP: 134/53 113/60    Pulse: 72 70    Temp: 98 F (36.7 C)  97.6 F (36.4 C)   TempSrc: Oral  Oral   Resp: 18 16    Height:      Weight:    263 lb 11.2 oz (119.614 kg)  SpO2: 96%       Intake/Output Summary (Last 24 hours) at 12/11/13 0819 Last data filed at 12/11/13 6010  Gross per 24 hour  Intake   1080 ml  Output   3850 ml  Net  -2770 ml    LABS: Basic Metabolic Panel:  Recent Labs  12/09/13  0435 12/10/13 0459  NA 143 140  K 3.9 3.6*  CL 98 93*  CO2 35* 33*  GLUCOSE 103* 97  BUN 24* 30*  CREATININE 0.86 0.91  CALCIUM 9.6 10.1   Liver Function Tests: No results found for this basename: AST, ALT, ALKPHOS, BILITOT, PROT, ALBUMIN,  in the last 72 hours No results found for this basename: LIPASE, AMYLASE,  in the last 72 hours CBC:  Recent Labs  12/09/13 0435  WBC 6.6  HGB 11.2*  HCT 35.5*  MCV 81.2  PLT 270   Cardiac Enzymes: No results found for this basename: CKTOTAL, CKMB, CKMBINDEX, TROPONINI,  in the last 72 hours BNP: No components found with this basename: POCBNP,  D-Dimer: No results found for this basename: DDIMER,  in the last 72 hours Hemoglobin A1C: No results found for this basename: HGBA1C,  in the last 72  hours Fasting Lipid Panel: No results found for this basename: CHOL, HDL, LDLCALC, TRIG, CHOLHDL, LDLDIRECT,  in the last 72 hours Thyroid Function Tests: No results found for this basename: TSH, T4TOTAL, FREET3, T3FREE, THYROIDAB,  in the last 72 hours Anemia Panel: No results found for this basename: VITAMINB12, FOLATE, FERRITIN, TIBC, IRON, RETICCTPCT,  in the last 72 hours  RADIOLOGY: No results found.  PHYSICAL EXAM General: NAD Neck: JVP 8 cm no thyromegaly or thyroid nodule.  Lungs: Clear to auscultation bilaterally with normal respiratory effort. CV: Nondisplaced PMI.  Heart regular S1/S2, no S3/S4, 2/6 SEM.  Trace-1+ edema to knees bilaterally.  No carotid bruit.  Normal pedal pulses.  Abdomen: obesesSoft, nontender, no hepatosplenomegaly, no distention.  Neurologic: Alert and oriented x 3.  Psych: Normal affect. Extremities: No clubbing or cyanosis.  Skin: Erythema anterior lower legs bilaterally  TELEMETRY: Reviewed telemetry pt in [facility]  ASSESSMENT AND PLAN: Melissa Myers was being admitted from the HF clinic with marked volume overload despite escalation of home diuretic regimen (torsemide 80 mg twice a day metolazone 2.5 mg three times a week). Weight up at least 15 pounds. This is likely in part to excessive fluid intake.  1. Acute on chronic diastolic CHF: Patient has lost > 20 lbs in hospital.  BUN starting to rise.  I think we can switch her over to po diuretics. - Stop IV Lasix, back to torsemide. - BMET not sent this morning, will send.  2. OHS/OSA: Continue oxygen, does not tolerate CPAP.  3. ID: Probably not cellulitis. Afebrile, normal WBCs.  Suspect discoloration due to chronic venous stasis changes. 4. Anemia: CBC was spurious.  Hgb has been at her baseline level since then.   5. Disposition: I think that she can go home today.  Will need followup in CHF clinic next week.  Cardiac meds: Torsemide 80 mg bid, metolazone 2.5 three times a week, ASA 81, hydralazine  25 tid, spironolactone 25 daily, Coreg 25 bid, KCl 40 tid on metolazone days and 40 bid on other days.   Loralie Champagne 12/11/2013 8:19 AM

## 2013-12-11 NOTE — Discharge Instructions (Signed)

## 2013-12-11 NOTE — Discharge Summary (Signed)
Advanced Heart Failure Team  Discharge Summary   Patient ID: Melissa Myers MRN: [id number], DOB/AGE: 06/26/1945 68 y.o. Admit date: 12/05/2013 D/C date:     12/11/2013   Primary Discharge Diagnoses:  1. Acute on chronic diastolic CHF- 2. OHS/OSA: Continue oxygen, does not tolerate CPAP.  3. ID: Probably not cellulitis. Afebrile, normal WBCs.  4. Anemia    Hospital Course:  Melissa Myers is a 68 year old with a history of DM, hyperlipidemia, OHS/OSA, obesity, on chronic oxygen at 2 liters [place] continuously, and chronic dyspnea/diastolic CHF.   She was admitted from the HF clinic with suspected LLE cellulitis and volume overload. She had marked volume overload despite increasing home diuretic regimen but she also had increased fluid intake as well as high diet at home.  Initially she was placed on antibiotics however they were as she was afebrile and WBC was not elevated.   She was diuresed with IV lasix and transitioned to back to torsemide 80 mg twice a day metolazone 2.5 mg three times a week. Renal function was followed closely and remained stable. Overall she diuresed 21 pounds. An ECHO was performed and showed EF 60-65%. She will continue to be followed closely in the HF and has follow up next week. Plan to check BMET at that time. [facility] to follow for HF management. [facility] will also pick her up at discharge.    Discharge Weight Range: Discharge weight 263 pounds.   Discharge Vitals: Blood pressure 140/67, pulse 75, temperature 97.6 F (36.4 C), temperature source Oral, resp. rate 16, height 4\' 11"  (1.499 m), weight 263 lb 11.2 oz (119.614 kg), SpO2 96.00%.  Labs: Lab Results  Component Value Date   WBC 6.6 12/09/2013   HGB 11.2* 12/09/2013   HCT 35.5* 12/09/2013   MCV 81.2 12/09/2013   PLT 270 12/09/2013    Recent Labs Lab 12/05/13 1700  12/11/13 0906  NA 142  < > 139  K 3.3*  < > 4.5  CL 94*  < > 90*  CO2 33*  < > 34*  BUN 28*  < > 36*  CREATININE 0.84  < > 1.05   CALCIUM 9.5  < > 9.9  PROT 6.8  --   --   BILITOT 0.2*  --   --   ALKPHOS 53  --   --   ALT 19  --   --   AST 21  --   --   GLUCOSE 86  < > 203*  < > = values in this interval not displayed. Lab Results  Component Value Date   CHOL 366* 08/19/2013   HDL 51.80 08/19/2013   LDLCALC 238* 08/19/2013   TRIG 382.0* 08/19/2013   BNP (last 3 results)  Recent Labs  08/07/13 1142 11/07/13 1139 12/05/13 1700  PROBNP 105.8 53.9 52.4    Diagnostic Studies/Procedures   No results found.  Discharge Medications     Medication List         APIDRA 100 UNIT/ML injection  Generic drug:  insulin glulisine  Inject 50-70 Units into the skin 3 (three) times daily with meals. 50 units with breakfast; 60 units with lunch; 70 units with dinner     aspirin EC 81 MG tablet  Take 81 mg by mouth daily.     carvedilol 25 MG tablet  Commonly known as:  COREG  Take 1 tablet (25 mg total) by mouth 2 (two) times daily with a meal.     cholecalciferol 1000 UNITS  tablet  Commonly known as:  VITAMIN D  Take 1,000 Units by mouth daily.     CoQ10 30 MG Caps  Take 30 mg by mouth daily.     DULoxetine 60 MG capsule  Commonly known as:  CYMBALTA  Take 1 capsule (60 mg total) by mouth daily.     fenofibrate 160 MG tablet  Take 160 mg by mouth daily.     hydrALAZINE 25 MG tablet  Commonly known as:  APRESOLINE  Take 25 mg by mouth 3 (three) times daily.     insulin glargine 100 UNIT/ML injection  Commonly known as:  LANTUS  Inject 60 Units into the skin at bedtime.     Magnesium 200 MG Tabs  Take 200 mg by mouth daily.     metolazone 2.5 MG tablet  Commonly known as:  ZAROXOLYN  Take 2.5 mg by mouth 3 (three) times a week. On Tuesdays, Thursdays, and Saturdays     omega-3 acid ethyl esters 1 G capsule  Commonly known as:  LOVAZA  Take 2 g by mouth 2 (two) times daily.     oxyCODONE-acetaminophen 5-325 MG per tablet  Commonly known as:  PERCOCET/ROXICET  Take 1 tablet by mouth 2 (two)  times daily. 1 tablet in AM and 1 tablet at bedtime     potassium chloride SA 20 MEQ tablet  Commonly known as:  K-DUR,KLOR-CON  - Take 40 mEq by mouth See admin instructions. Take 2 tablets (40 meq) 3 times daily on Tuesdays, Thursdays, and Saturdays.  - Take 2 tablets (40 meq) 2 times daily on Mondays, Wednesdays, Fridays, and Sundays.     pregabalin 100 MG capsule  Commonly known as:  LYRICA  Take 1 capsule (100 mg total) by mouth 3 (three) times daily.     rOPINIRole 2 MG tablet  Commonly known as:  REQUIP  Take 2 mg by mouth 2 (two) times daily.     spironolactone 25 MG tablet  Commonly known as:  ALDACTONE  Take 1 tablet (25 mg total) by mouth daily.     torsemide 20 MG tablet  Commonly known as:  DEMADEX  Take 80 mg by mouth 2 (two) times daily.        Disposition   The patient will be discharged in stable condition to home.     Discharge Instructions   Contraindication to ACEI at discharge    Complete by:  As directed      Diet - low sodium heart healthy    Complete by:  As directed      Heart Failure patients record your daily weight using the same scale at the same time of day    Complete by:  As directed      Increase activity slowly    Complete by:  As directed           Follow-up Information   Follow up with [place]. (RN/PT)    Contact information:   [place] [address] [phone]       Follow up with [facility], NP On 12/18/2013. (at 10:15. Heart Failure Clinic  Garage Code [id number] )    Specialty:  Nurse Practitioner   Contact information:   [address] [place] [address] [phone]         Duration of Discharge Encounter: Greater than 35 minutes   Signed, CLEGG,AMY  NP-C  12/11/2013, 11:38 AM

## 2013-12-11 NOTE — Progress Notes (Signed)
Patient was educated on process for home medication reconciliation and provided written instructions as well. Patient was also counseled on HF medications and changes to be made upon arriving home. She told me that she already had the "Living Better with Heart Failure" patient education packet at home, but I gave her a medication bag to be brought with her to her follow up clinic visits with her current medications. All questions answered.  Nena Jordan, PharmD, BCPS 12/11/2013, 2:00 PM

## 2013-12-12 DIAGNOSIS — E669 Obesity, unspecified: Secondary | ICD-10-CM | POA: Diagnosis not present

## 2013-12-12 DIAGNOSIS — Z9981 Dependence on supplemental oxygen: Secondary | ICD-10-CM | POA: Diagnosis not present

## 2013-12-12 DIAGNOSIS — Z794 Long term (current) use of insulin: Secondary | ICD-10-CM | POA: Diagnosis not present

## 2013-12-12 DIAGNOSIS — I5033 Acute on chronic diastolic (congestive) heart failure: Secondary | ICD-10-CM | POA: Diagnosis not present

## 2013-12-12 DIAGNOSIS — G4733 Obstructive sleep apnea (adult) (pediatric): Secondary | ICD-10-CM | POA: Diagnosis not present

## 2013-12-12 DIAGNOSIS — E119 Type 2 diabetes mellitus without complications: Secondary | ICD-10-CM | POA: Diagnosis not present

## 2013-12-13 ENCOUNTER — Other Ambulatory Visit (HOSPITAL_COMMUNITY): Payer: Self-pay | Admitting: Internal Medicine

## 2013-12-13 DIAGNOSIS — E119 Type 2 diabetes mellitus without complications: Secondary | ICD-10-CM | POA: Diagnosis not present

## 2013-12-13 DIAGNOSIS — I5033 Acute on chronic diastolic (congestive) heart failure: Secondary | ICD-10-CM | POA: Diagnosis not present

## 2013-12-13 DIAGNOSIS — Z9981 Dependence on supplemental oxygen: Secondary | ICD-10-CM | POA: Diagnosis not present

## 2013-12-13 DIAGNOSIS — E669 Obesity, unspecified: Secondary | ICD-10-CM | POA: Diagnosis not present

## 2013-12-13 DIAGNOSIS — G4733 Obstructive sleep apnea (adult) (pediatric): Secondary | ICD-10-CM | POA: Diagnosis not present

## 2013-12-13 DIAGNOSIS — Z794 Long term (current) use of insulin: Secondary | ICD-10-CM | POA: Diagnosis not present

## 2013-12-15 DIAGNOSIS — Z794 Long term (current) use of insulin: Secondary | ICD-10-CM | POA: Diagnosis not present

## 2013-12-15 DIAGNOSIS — I5033 Acute on chronic diastolic (congestive) heart failure: Secondary | ICD-10-CM | POA: Diagnosis not present

## 2013-12-15 DIAGNOSIS — E119 Type 2 diabetes mellitus without complications: Secondary | ICD-10-CM | POA: Diagnosis not present

## 2013-12-15 DIAGNOSIS — Z9981 Dependence on supplemental oxygen: Secondary | ICD-10-CM | POA: Diagnosis not present

## 2013-12-15 DIAGNOSIS — G4733 Obstructive sleep apnea (adult) (pediatric): Secondary | ICD-10-CM | POA: Diagnosis not present

## 2013-12-15 DIAGNOSIS — E669 Obesity, unspecified: Secondary | ICD-10-CM | POA: Diagnosis not present

## 2013-12-16 ENCOUNTER — Encounter: Payer: Self-pay | Admitting: Podiatry

## 2013-12-16 ENCOUNTER — Ambulatory Visit (INDEPENDENT_AMBULATORY_CARE_PROVIDER_SITE_OTHER): Payer: Medicare Other | Admitting: Podiatry

## 2013-12-16 DIAGNOSIS — G990 Autonomic neuropathy in diseases classified elsewhere: Secondary | ICD-10-CM | POA: Diagnosis not present

## 2013-12-16 DIAGNOSIS — E1042 Type 1 diabetes mellitus with diabetic polyneuropathy: Secondary | ICD-10-CM

## 2013-12-16 DIAGNOSIS — B351 Tinea unguium: Secondary | ICD-10-CM

## 2013-12-16 NOTE — Patient Instructions (Signed)
Use a pumice stone or callus file daily on the callused heels and apply an all-purpose skin lotion to the heels on a daily basis  Diabetes and Foot Care Diabetes may cause you to have problems because of poor blood supply (circulation) to your feet and legs. This may cause the skin on your feet to become thinner, break easier, and heal more slowly. Your skin may become dry, and the skin may peel and crack. You may also have nerve damage in your legs and feet causing decreased feeling in them. You may not notice minor injuries to your feet that could lead to infections or more serious problems. Taking care of your feet is one of the most important things you can do for yourself.  HOME CARE INSTRUCTIONS  Wear shoes at all times, even in the house. Do not go barefoot. Bare feet are easily injured.  Check your feet daily for blisters, cuts, and redness. If you cannot see the bottom of your feet, use a mirror or ask someone for help.  Wash your feet with warm water (do not use hot water) and mild soap. Then pat your feet and the areas between your toes until they are completely dry. Do not soak your feet as this can dry your skin.  Apply a moisturizing lotion or petroleum jelly (that does not contain alcohol and is unscented) to the skin on your feet and to dry, brittle toenails. Do not apply lotion between your toes.  Trim your toenails straight across. Do not dig under them or around the cuticle. File the edges of your nails with an emery board or nail file.  Do not cut corns or calluses or try to remove them with medicine.  Wear clean socks or stockings every day. Make sure they are not too tight. Do not wear knee-high stockings since they may decrease blood flow to your legs.  Wear shoes that fit properly and have enough cushioning. To break in new shoes, wear them for just a few hours a day. This prevents you from injuring your feet. Always look in your shoes before you put them on to be sure  there are no objects inside.  Do not cross your legs. This may decrease the blood flow to your feet.  If you find a minor scrape, cut, or break in the skin on your feet, keep it and the skin around it clean and dry. These areas may be cleansed with mild soap and water. Do not cleanse the area with peroxide, alcohol, or iodine.  When you remove an adhesive bandage, be sure not to damage the skin around it.  If you have a wound, look at it several times a day to make sure it is healing.  Do not use heating pads or hot water bottles. They may burn your skin. If you have lost feeling in your feet or legs, you may not know it is happening until it is too late.  Make sure your health care provider performs a complete foot exam at least annually or more often if you have foot problems. Report any cuts, sores, or bruises to your health care provider immediately. SEEK MEDICAL CARE IF:   You have an injury that is not healing.  You have cuts or breaks in the skin.  You have an ingrown nail.  You notice redness on your legs or feet.  You feel burning or tingling in your legs or feet.  You have pain or cramps in your legs  and feet.  Your legs or feet are numb.  Your feet always feel cold. SEEK IMMEDIATE MEDICAL CARE IF:   There is increasing redness, swelling, or pain in or around a wound.  There is a red line that goes up your leg.  Pus is coming from a wound.  You develop a fever or as directed by your health care provider.  You notice a bad smell coming from an ulcer or wound. Document Released: 02/05/2000 Document Revised: 10/10/2012 Document Reviewed: 07/17/2012 [facility] Patient Information 2015 [place], [place]. This information is not intended to replace advice given to you by your health care provider. Make sure you discuss any questions you have with your health care provider.

## 2013-12-17 DIAGNOSIS — I5033 Acute on chronic diastolic (congestive) heart failure: Secondary | ICD-10-CM | POA: Diagnosis not present

## 2013-12-17 DIAGNOSIS — Z794 Long term (current) use of insulin: Secondary | ICD-10-CM | POA: Diagnosis not present

## 2013-12-17 DIAGNOSIS — Z9981 Dependence on supplemental oxygen: Secondary | ICD-10-CM | POA: Diagnosis not present

## 2013-12-17 DIAGNOSIS — E669 Obesity, unspecified: Secondary | ICD-10-CM | POA: Diagnosis not present

## 2013-12-17 DIAGNOSIS — G4733 Obstructive sleep apnea (adult) (pediatric): Secondary | ICD-10-CM | POA: Diagnosis not present

## 2013-12-17 DIAGNOSIS — E119 Type 2 diabetes mellitus without complications: Secondary | ICD-10-CM | POA: Diagnosis not present

## 2013-12-17 NOTE — Progress Notes (Signed)
Patient ID: Melissa Myers, female   DOB: December 27, 1945, 68 y.o.   MRN: [id number]  Subjective: This patient presents again complaining of painful toenails  Objective: Elongated, hypertrophic, incurvated toenails 6-10 Callusing about the margins of the right and left heels  Assessment: Onychomycoses Diabetic peripheral neuropathy Keratoses  Plan: Nails 10 are debrided without any bleeding Keratoses are debrided with a bleeding Advised patient to apply moisturizing lotion to heels on a daily basis and use a pumice stone on a daily basis  Report 3 months

## 2013-12-17 NOTE — Progress Notes (Signed)
Patient ID: Melissa Myers, female   DOB: November 20, 1945, 68 y.o.   MRN: [id number] PCP: Dr. Asa Lente Endocrinologist: Dr Loanne Drilling  68 yo with history of HTN, DM, hyperlipidemia, OHS/OSA, and chronic dyspnea/diastolic CHF.  Patient had an echo in 04/2009 showing moderate LVH and preserved LV systolic function. However, there was a very large LV mid-cavity gradient with valsalva.  Patient developed quite significant exertional dyspnea to the point where she was short of breath walking around her house. She had a pulmonary evaluation with Dr. Elsworth Soho but no primary lung problems were identified. Patient had an ETT-myoview in 05/2009 which was negative for ischemia or infarction. Right heart cath in 05/2009 showed mildly elevated right heart filling pressures but normal PA pressure and normal PCWP. She was started her on a beta blocker (Coreg) to try to lower her LV mid-cavity gradient (that likely occurs with exertion) and to better control BP.  V/Q scan was negative for PE.  PFTs from 06/2011 showed a restrictive defect. Last echo in 06/2013 showed EF 60-65% with normal RV size and systolic function.      Admitted 12/05/13 with volume overload. Diursed with IV lasix and transitioned to torsemide 80 mg twice ad ay + metolazone. Overall she diuresed 21 pounds. Discharge weight was 263 pounds.   [facility] Follow up for Heart Failure:  Overall feeling ok. Mild dizziness. Denies SOB/PND/Orthopnea. Weight at home 265-271 pounds. Able to move around the house more.  Taking all medications. Tries to follow low salt diet and limit fluid intake.  [facility] following.   Labs (10/13): K 4.1, creatinine 0.85 Labs (4/14): K 3.7, creatinine 1.2, BUN 43, BNP 23 Labs (5/14): K 4.1, creatinine 1.0 Labs (7/14): K 4.6 Creatinine 1.0 BNP 39.0  Labs (8/14): K 3.5, creatinine 0.91, BNP 71 Labs (11/14): K 3.7, creatinine 0.9 Labs (12/14): K 4.1, creatinine 0.8, BNP 52 Labs (3/15): K 4, creatinine 0.9 Labs (4/15): K 3.7, creatinine  0.9 Labs (5/15): K 3.8, creatinine 0.9, BNP 29 Labs (6/15): K 3.4, BUN 105, creatinine 0.9 =>1.7 Labs (6/17/5) K 4.2, BUN 41, Creatinine 0.80 Labs (08/19/13) K 3.2, BUN 71, creatinine 1.5 Labs 09/05/13 K 4.1 Creatinine 1.1 Labs 11/07/13 K 3.6 Creatinine 0.91 Labs 12/11/13 K 4.5 Creatine 1.05  Allergies (verified):  1) ! Sulfa  2) Morphine   Past Medical History:  1. Diabetes mellitus, type II  2. Hyperlipidemia: She has been unable to tolerate statins (has been on Vytorin, lovastatin, and Crestor) due to what sounds like rhabdo: developed muscle weakness and was told she had "muscle damage" with each of these statins. She has been told not to take statins anymore.  3. Hypertension 4. Obesity  5. Restless Leg Syndrome  6. OA  7. peripheral neuropathy  8. OHS/OSA: Uses supplemental oxygen, does not tolerate CPAP.   9. Chronic nausea/diarrhea: ? IBS  10. Diastolic CHF: Echo (2/44) with normal LV size, moderate LVH, EF 65-70%, LV mid-cavity gradient reaching 63 mmHg with valsalva but minimal at rest, grade I diastolic dysfunction, cannot estimate PA systolic pressure (no TR doppler signal), RV normal. RHC (4/11): Mean RA 11, RV 37/13, PA 33/15, mean PCWP 12, CI 3.3. Echo in 11/12 showed mild LVH, EF 65-70%, no LV mid-cavity gradient was mentioned.  Echo in 4/14 showed EF 65-70%, mild LVH, grade I diastolic dysfunction, PA systolic pressure 35 mmHg, mild LV mid-cavity gradient.  Echo (5/15) with EF 60-65%, mild TR, normal RV size and systolic function.  11. ETT-myoview (4/11): 5'30", stopped due to  fatigue, normal EF, no evidence for scar or ischemia. 12. V/Q scan 11/12: negative for PE.  Lower extremity venous doppler US (3/14): negative for DVT.  13. PFTs (5/13): FVC 50%, FEV1 62%, ratio 106%, DLCO 69%, TLC 69%.   Family History:  Family History of Alcoholism/Addiction (parent)  Family History Diabetes 1st degree relative (grandparent)  Family History High cholesterol (parent, grandparent)   Family History Hypertension (parent, grandparent)  Family History Lung cancer (grandparent)  Stomach cancer (grandmother)  Celiac Sprue daughter  Mother with MI at 26, father with MI at 32, uncle with MI at 6, brother with stents in his 23s, multiple aunts with MIs and CVAs   Social History:  Never Smoked  no alcohol  married, lives with spouse and her mother  retired Network engineer - now housewife  Alcohol Use - no  Illicit Drug Use - no  Review of Systems  All systems reviewed and negative except as per HPI.   ROS: All systems reviewed and negative except as per HPI.   Current Outpatient Prescriptions  Medication Sig Dispense Refill  . aspirin EC 81 MG tablet Take 81 mg by mouth daily.      . carvedilol (COREG) 25 MG tablet Take 1 tablet (25 mg total) by mouth 2 (two) times daily with a meal.  60 tablet  6  . cholecalciferol (VITAMIN D) 1000 UNITS tablet Take 1,000 Units by mouth daily.      . Coenzyme Q10 (COQ10) 30 MG CAPS Take 30 mg by mouth daily.       . DULoxetine (CYMBALTA) 60 MG capsule Take 1 capsule (60 mg total) by mouth daily.  90 capsule  3  . fenofibrate 160 MG tablet Take 160 mg by mouth daily.      . hydrALAZINE (APRESOLINE) 25 MG tablet Take 25 mg by mouth 3 (three) times daily.      . insulin glargine (LANTUS) 100 UNIT/ML injection Inject 60 Units into the skin at bedtime.      . insulin glulisine (APIDRA) 100 UNIT/ML injection Inject 50-70 Units into the skin 3 (three) times daily with meals. 50 units with breakfast; 60 units with lunch; 70 units with dinner      . Magnesium 200 MG TABS Take 200 mg by mouth daily.   60 each    . metolazone (ZAROXOLYN) 2.5 MG tablet Take 2.5 mg by mouth 3 (three) times a week. On Tuesdays, Thursdays, and Saturdays      . omega-3 acid ethyl esters (LOVAZA) 1 G capsule Take 2 g by mouth 2 (two) times daily.      [name] oxyCODONE-acetaminophen (PERCOCET/ROXICET) 5-325 MG per tablet Take 1 tablet by mouth 2 (two) times daily. 1 tablet in AM  and 1 tablet at bedtime      . potassium chloride SA (K-DUR,KLOR-CON) 20 MEQ tablet Take 40 mEq by mouth See admin instructions. Take 2 tablets (40 meq) 3 times daily on Tuesdays, Thursdays, and Saturdays. Take 2 tablets (40 meq) 2 times daily on Mondays, Wednesdays, Fridays, and Sundays.      . pregabalin (LYRICA) 100 MG capsule Take 1 capsule (100 mg total) by mouth 3 (three) times daily.  90 capsule  5  . rOPINIRole (REQUIP) 2 MG tablet Take 2 mg by mouth 2 (two) times daily.      [name] spironolactone (ALDACTONE) 25 MG tablet Take 1 tablet (25 mg total) by mouth daily.  30 tablet  6  . torsemide (DEMADEX) 20 MG tablet Take 80 mg  by mouth 2 (two) times daily.       No current facility-administered medications for this encounter.   Filed Vitals:   12/18/13 0958  BP: 110/65  Pulse: 63  Resp: 20  Weight: 270 lb 2 oz (122.528 kg)  SpO2: 95%    General: NAD, obese Husband present Neck: Thick, JVP difficult to assess d/t body habitus but does not appear elevated. No thyromegaly or thyroid nodule.  Lungs: Clear to auscultation bilaterally with normal respiratory effort. Chronic 2L oxygen CV: Nondisplaced PMI.  Heart regular S1/S2, no S3/S4, 2/6 SEM RUSB.  Trace woody edema with chronic erythema.  No carotid bruit.  Abdomen: Soft, nontender, no hepatosplenomegaly, Obese Neurologic: Alert and oriented x 3.  Psych: Normal affect. Extremities: No clubbing/cyanosis, bilateral venous stasis RLE/LLE trace edema.     Assessment/Plan: 1. Chronic diastolic CHF:   - Volume status ok but weight is starting to trend up. Continue current diuretic regimen with torsemide + metolazone.  Reinforced daily weights, low salt diet and limiting fluid to < 2 liters per day.  Today I will call [facility] to set up tele monitoring.  2. Pulmonary: Suspect OHS/OSA.   - Followed by Dr Elsworth Soho. Per Dr Elsworth Soho no role for CPAP yet. Continue oxygen continuous. 3. Obesity - Would like to consult oupatient nutrition/diabetes center.  She would like to hold off. Needs to lose weight.    4. Renal insufficieny - Baseline 1.3-1.6.  Follow up in 2 weeks. Check BMET next visit.     Rily Nickey NP-C  12/18/2013

## 2013-12-18 ENCOUNTER — Ambulatory Visit (HOSPITAL_COMMUNITY)
Admit: 2013-12-18 | Discharge: 2013-12-18 | Disposition: A | Discharge status: Home / Self Care | Payer: Medicare Other | Attending: Internal Medicine | Admitting: Internal Medicine

## 2013-12-18 ENCOUNTER — Encounter (HOSPITAL_COMMUNITY): Payer: Self-pay

## 2013-12-18 VITALS — BP 110/65 | HR 63 | Resp 20 | Wt 270.1 lb

## 2013-12-18 DIAGNOSIS — E785 Hyperlipidemia, unspecified: Secondary | ICD-10-CM | POA: Insufficient documentation

## 2013-12-18 DIAGNOSIS — E118 Type 2 diabetes mellitus with unspecified complications: Secondary | ICD-10-CM | POA: Insufficient documentation

## 2013-12-18 DIAGNOSIS — I10 Essential (primary) hypertension: Secondary | ICD-10-CM | POA: Insufficient documentation

## 2013-12-18 DIAGNOSIS — N259 Disorder resulting from impaired renal tubular function, unspecified: Secondary | ICD-10-CM

## 2013-12-18 DIAGNOSIS — Z9981 Dependence on supplemental oxygen: Secondary | ICD-10-CM | POA: Insufficient documentation

## 2013-12-18 DIAGNOSIS — N289 Disorder of kidney and ureter, unspecified: Secondary | ICD-10-CM | POA: Insufficient documentation

## 2013-12-18 DIAGNOSIS — I5032 Chronic diastolic (congestive) heart failure: Secondary | ICD-10-CM | POA: Insufficient documentation

## 2013-12-18 DIAGNOSIS — G4730 Sleep apnea, unspecified: Secondary | ICD-10-CM

## 2013-12-18 DIAGNOSIS — G4733 Obstructive sleep apnea (adult) (pediatric): Secondary | ICD-10-CM | POA: Insufficient documentation

## 2013-12-18 DIAGNOSIS — E669 Obesity, unspecified: Secondary | ICD-10-CM | POA: Diagnosis not present

## 2013-12-18 DIAGNOSIS — I5033 Acute on chronic diastolic (congestive) heart failure: Secondary | ICD-10-CM | POA: Diagnosis not present

## 2013-12-18 DIAGNOSIS — Z794 Long term (current) use of insulin: Secondary | ICD-10-CM | POA: Insufficient documentation

## 2013-12-18 DIAGNOSIS — Z7982 Long term (current) use of aspirin: Secondary | ICD-10-CM | POA: Insufficient documentation

## 2013-12-18 DIAGNOSIS — E119 Type 2 diabetes mellitus without complications: Secondary | ICD-10-CM | POA: Diagnosis not present

## 2013-12-18 NOTE — Patient Instructions (Signed)
Follow up in 2 weeks  Do the following things EVERYDAY: 1) Weigh yourself in the morning before breakfast. Write it down and keep it in a log. 2) Take your medicines as prescribed 3) Eat low salt foods--Limit salt (sodium) to 2000 mg per day.  4) Stay as active as you can everyday 5) Limit all fluids for the day to less than 2 liters 

## 2013-12-19 ENCOUNTER — Ambulatory Visit: Payer: Medicare Other | Admitting: Internal Medicine

## 2013-12-19 DIAGNOSIS — G4733 Obstructive sleep apnea (adult) (pediatric): Secondary | ICD-10-CM | POA: Diagnosis not present

## 2013-12-19 DIAGNOSIS — I5033 Acute on chronic diastolic (congestive) heart failure: Secondary | ICD-10-CM | POA: Diagnosis not present

## 2013-12-19 DIAGNOSIS — E119 Type 2 diabetes mellitus without complications: Secondary | ICD-10-CM | POA: Diagnosis not present

## 2013-12-19 DIAGNOSIS — Z9981 Dependence on supplemental oxygen: Secondary | ICD-10-CM | POA: Diagnosis not present

## 2013-12-19 DIAGNOSIS — E669 Obesity, unspecified: Secondary | ICD-10-CM | POA: Diagnosis not present

## 2013-12-19 DIAGNOSIS — Z794 Long term (current) use of insulin: Secondary | ICD-10-CM | POA: Diagnosis not present

## 2013-12-20 DIAGNOSIS — E119 Type 2 diabetes mellitus without complications: Secondary | ICD-10-CM | POA: Diagnosis not present

## 2013-12-20 DIAGNOSIS — I5033 Acute on chronic diastolic (congestive) heart failure: Secondary | ICD-10-CM | POA: Diagnosis not present

## 2013-12-20 DIAGNOSIS — G4733 Obstructive sleep apnea (adult) (pediatric): Secondary | ICD-10-CM | POA: Diagnosis not present

## 2013-12-20 DIAGNOSIS — Z794 Long term (current) use of insulin: Secondary | ICD-10-CM | POA: Diagnosis not present

## 2013-12-20 DIAGNOSIS — E669 Obesity, unspecified: Secondary | ICD-10-CM | POA: Diagnosis not present

## 2013-12-20 DIAGNOSIS — Z9981 Dependence on supplemental oxygen: Secondary | ICD-10-CM | POA: Diagnosis not present

## 2013-12-22 ENCOUNTER — Other Ambulatory Visit: Payer: Self-pay | Admitting: Endocrinology

## 2013-12-23 ENCOUNTER — Telehealth: Payer: Self-pay | Admitting: Endocrinology

## 2013-12-23 DIAGNOSIS — E119 Type 2 diabetes mellitus without complications: Secondary | ICD-10-CM | POA: Diagnosis not present

## 2013-12-23 DIAGNOSIS — G4733 Obstructive sleep apnea (adult) (pediatric): Secondary | ICD-10-CM | POA: Diagnosis not present

## 2013-12-23 DIAGNOSIS — I5033 Acute on chronic diastolic (congestive) heart failure: Secondary | ICD-10-CM | POA: Diagnosis not present

## 2013-12-23 DIAGNOSIS — Z9981 Dependence on supplemental oxygen: Secondary | ICD-10-CM | POA: Diagnosis not present

## 2013-12-23 DIAGNOSIS — E669 Obesity, unspecified: Secondary | ICD-10-CM | POA: Diagnosis not present

## 2013-12-23 DIAGNOSIS — Z794 Long term (current) use of insulin: Secondary | ICD-10-CM | POA: Diagnosis not present

## 2013-12-23 NOTE — Telephone Encounter (Signed)
Pt needs MD to know that her insurance after jan 1 she will not be covered for the apidra her preferred insulin will now be humalog.

## 2013-12-23 NOTE — Telephone Encounter (Signed)
See below, Thanks!  

## 2013-12-23 NOTE — Telephone Encounter (Signed)
Please advise if ok to refill. Med is listed under historical provider.  Thanks!

## 2013-12-25 DIAGNOSIS — Z794 Long term (current) use of insulin: Secondary | ICD-10-CM | POA: Diagnosis not present

## 2013-12-25 DIAGNOSIS — E669 Obesity, unspecified: Secondary | ICD-10-CM | POA: Diagnosis not present

## 2013-12-25 DIAGNOSIS — Z9981 Dependence on supplemental oxygen: Secondary | ICD-10-CM | POA: Diagnosis not present

## 2013-12-25 DIAGNOSIS — E119 Type 2 diabetes mellitus without complications: Secondary | ICD-10-CM | POA: Diagnosis not present

## 2013-12-25 DIAGNOSIS — I5033 Acute on chronic diastolic (congestive) heart failure: Secondary | ICD-10-CM | POA: Diagnosis not present

## 2013-12-25 DIAGNOSIS — G4733 Obstructive sleep apnea (adult) (pediatric): Secondary | ICD-10-CM | POA: Diagnosis not present

## 2013-12-26 DIAGNOSIS — E669 Obesity, unspecified: Secondary | ICD-10-CM | POA: Diagnosis not present

## 2013-12-26 DIAGNOSIS — Z9981 Dependence on supplemental oxygen: Secondary | ICD-10-CM | POA: Diagnosis not present

## 2013-12-26 DIAGNOSIS — I5033 Acute on chronic diastolic (congestive) heart failure: Secondary | ICD-10-CM | POA: Diagnosis not present

## 2013-12-26 DIAGNOSIS — Z794 Long term (current) use of insulin: Secondary | ICD-10-CM | POA: Diagnosis not present

## 2013-12-26 DIAGNOSIS — E119 Type 2 diabetes mellitus without complications: Secondary | ICD-10-CM | POA: Diagnosis not present

## 2013-12-26 DIAGNOSIS — G4733 Obstructive sleep apnea (adult) (pediatric): Secondary | ICD-10-CM | POA: Diagnosis not present

## 2013-12-27 DIAGNOSIS — G4733 Obstructive sleep apnea (adult) (pediatric): Secondary | ICD-10-CM | POA: Diagnosis not present

## 2013-12-27 DIAGNOSIS — I5033 Acute on chronic diastolic (congestive) heart failure: Secondary | ICD-10-CM | POA: Diagnosis not present

## 2013-12-27 DIAGNOSIS — E669 Obesity, unspecified: Secondary | ICD-10-CM | POA: Diagnosis not present

## 2013-12-27 DIAGNOSIS — Z794 Long term (current) use of insulin: Secondary | ICD-10-CM | POA: Diagnosis not present

## 2013-12-27 DIAGNOSIS — E119 Type 2 diabetes mellitus without complications: Secondary | ICD-10-CM | POA: Diagnosis not present

## 2013-12-27 DIAGNOSIS — Z9981 Dependence on supplemental oxygen: Secondary | ICD-10-CM | POA: Diagnosis not present

## 2013-12-30 DIAGNOSIS — E669 Obesity, unspecified: Secondary | ICD-10-CM | POA: Diagnosis not present

## 2013-12-30 DIAGNOSIS — Z9981 Dependence on supplemental oxygen: Secondary | ICD-10-CM | POA: Diagnosis not present

## 2013-12-30 DIAGNOSIS — E119 Type 2 diabetes mellitus without complications: Secondary | ICD-10-CM | POA: Diagnosis not present

## 2013-12-30 DIAGNOSIS — Z794 Long term (current) use of insulin: Secondary | ICD-10-CM | POA: Diagnosis not present

## 2013-12-30 DIAGNOSIS — G4733 Obstructive sleep apnea (adult) (pediatric): Secondary | ICD-10-CM | POA: Diagnosis not present

## 2013-12-30 DIAGNOSIS — I5033 Acute on chronic diastolic (congestive) heart failure: Secondary | ICD-10-CM | POA: Diagnosis not present

## 2014-01-01 ENCOUNTER — Telehealth (HOSPITAL_COMMUNITY): Payer: Self-pay | Admitting: Anesthesiology

## 2014-01-01 ENCOUNTER — Ambulatory Visit (HOSPITAL_COMMUNITY)
Admission: RE | Admit: 2014-01-01 | Discharge: 2014-01-01 | Disposition: A | Discharge status: Home / Self Care | Payer: Medicare Other | Source: Ambulatory Visit | Attending: Internal Medicine | Admitting: Internal Medicine

## 2014-01-01 ENCOUNTER — Encounter (HOSPITAL_COMMUNITY): Payer: Self-pay

## 2014-01-01 ENCOUNTER — Encounter (HOSPITAL_COMMUNITY): Payer: Medicare Other

## 2014-01-01 VITALS — BP 150/70 | HR 76 | Resp 18 | Wt 270.1 lb

## 2014-01-01 DIAGNOSIS — M791 Myalgia: Secondary | ICD-10-CM | POA: Diagnosis not present

## 2014-01-01 DIAGNOSIS — E669 Obesity, unspecified: Secondary | ICD-10-CM | POA: Insufficient documentation

## 2014-01-01 DIAGNOSIS — M47817 Spondylosis without myelopathy or radiculopathy, lumbosacral region: Secondary | ICD-10-CM | POA: Diagnosis not present

## 2014-01-01 DIAGNOSIS — I5032 Chronic diastolic (congestive) heart failure: Secondary | ICD-10-CM

## 2014-01-01 DIAGNOSIS — N183 Chronic kidney disease, stage 3 unspecified: Secondary | ICD-10-CM | POA: Insufficient documentation

## 2014-01-01 DIAGNOSIS — I10 Essential (primary) hypertension: Secondary | ICD-10-CM | POA: Diagnosis not present

## 2014-01-01 DIAGNOSIS — G609 Hereditary and idiopathic neuropathy, unspecified: Secondary | ICD-10-CM | POA: Diagnosis not present

## 2014-01-01 DIAGNOSIS — M5417 Radiculopathy, lumbosacral region: Secondary | ICD-10-CM | POA: Diagnosis not present

## 2014-01-01 DIAGNOSIS — Z79899 Other long term (current) drug therapy: Secondary | ICD-10-CM | POA: Diagnosis not present

## 2014-01-01 DIAGNOSIS — M5137 Other intervertebral disc degeneration, lumbosacral region: Secondary | ICD-10-CM | POA: Diagnosis not present

## 2014-01-01 DIAGNOSIS — Z79891 Long term (current) use of opiate analgesic: Secondary | ICD-10-CM | POA: Diagnosis not present

## 2014-01-01 DIAGNOSIS — M1990 Unspecified osteoarthritis, unspecified site: Secondary | ICD-10-CM | POA: Diagnosis not present

## 2014-01-01 DIAGNOSIS — M25569 Pain in unspecified knee: Secondary | ICD-10-CM | POA: Diagnosis not present

## 2014-01-01 DIAGNOSIS — G894 Chronic pain syndrome: Secondary | ICD-10-CM | POA: Diagnosis not present

## 2014-01-01 LAB — BASIC METABOLIC PANEL
Anion gap: 15 (ref 5–15)
BUN: 69 mg/dL — ABNORMAL HIGH (ref 6–23)
CALCIUM: 9.6 mg/dL (ref 8.4–10.5)
CO2: 34 meq/L — AB (ref 19–32)
CREATININE: 1.2 mg/dL — AB (ref 0.50–1.10)
Chloride: 91 mEq/L — ABNORMAL LOW (ref 96–112)
GFR calc Af Amer: 53 mL/min — ABNORMAL LOW (ref >90)
GFR, EST NON AFRICAN AMERICAN: 45 mL/min — AB (ref >90)
GLUCOSE: 129 mg/dL — AB (ref 70–99)
Potassium: 3.7 mEq/L (ref 3.7–5.3)
SODIUM: 140 meq/L (ref 137–147)

## 2014-01-01 MED ORDER — METOLAZONE 2.5 MG PO TABS: 2.5000 mg | ORAL_TABLET | ORAL | Status: DC

## 2014-01-01 MED ORDER — POTASSIUM CHLORIDE CRYS ER 20 MEQ PO TBCR: EXTENDED_RELEASE_TABLET | ORAL | Status: DC

## 2014-01-01 NOTE — Patient Instructions (Signed)
Doing well.  Increase your metolazone to 2.5 mg (1 tablet) every other day. Call if weight gets less than 262 lbs at home.  On days you take metolazone take potassium 40 meq (2 tablets) three times a day. On days you don't take metolazone take 40 meq (2 tablets) twice a day.   Follow up in 1 month  Do the following things EVERYDAY: 1) Weigh yourself in the morning before breakfast. Write it down and keep it in a log. 2) Take your medicines as prescribed 3) Eat low salt foods-Limit salt (sodium) to 2000 mg per day.  4) Stay as active as you can everyday 5) Limit all fluids for the day to less than 2 liters 6)

## 2014-01-01 NOTE — Progress Notes (Signed)
Patient ID: Melissa Myers, female   DOB: [birth year], 68 y.o.   MRN: [id number] PCP: Dr. Asa Lente Endocrinologist: Dr Loanne Drilling  68 yo with history of HTN, DM, hyperlipidemia, OHS/OSA, and chronic dyspnea/diastolic CHF.  Patient had an echo in 04/2009 showing moderate LVH and preserved LV systolic function. However, there was a very large LV mid-cavity gradient with valsalva.  Patient developed quite significant exertional dyspnea to the point where she was short of breath walking around her house. She had a pulmonary evaluation with Dr. Elsworth Soho but no primary lung problems were identified. Patient had an ETT-myoview in 05/2009 which was negative for ischemia or infarction. Right heart cath in 05/2009 showed mildly elevated right heart filling pressures but normal PA pressure and normal PCWP. She was started her on a beta blocker (Coreg) to try to lower her LV mid-cavity gradient (that likely occurs with exertion) and to better control BP.  V/Q scan was negative for PE.  PFTs from 06/2011 showed a restrictive defect. Last echo in 06/2013 showed EF 60-65% with normal RV size and systolic function.      Admitted 12/05/13 with volume overload. Diursed with IV lasix and transitioned to torsemide 80 mg twice ad ay + metolazone. Overall she diuresed 21 pounds. Discharge weight was 263 pounds.   Follow up for Heart Failure: Has not taken medications today. Doing ok. Denies SOB, orthopnea, PND or CP.  Wears chronic 2L O2 24 hrs/day. Weight at home 265-270 lbs. Walking around the house with no issues. Follows low salt diet but drinking more than 2L a day.   Labs (10/13): K 4.1, creatinine 0.85 Labs (4/14): K 3.7, creatinine 1.2, BUN 43, BNP 23 Labs (5/14): K 4.1, creatinine 1.0 Labs (7/14): K 4.6 Creatinine 1.0 BNP 39.0  Labs (8/14): K 3.5, creatinine 0.91, BNP 71 Labs (11/14): K 3.7, creatinine 0.9 Labs (12/14): K 4.1, creatinine 0.8, BNP 52 Labs (3/15): K 4, creatinine 0.9 Labs (4/15): K 3.7, creatinine  0.9 Labs (5/15): K 3.8, creatinine 0.9, BNP 29 Labs (6/15): K 3.4, BUN 105, creatinine 0.9 =>1.7 Labs (6/17/5) K 4.2, BUN 41, Creatinine 0.80 Labs (08/19/13) K 3.2, BUN 71, creatinine 1.5 Labs 09/05/13 K 4.1 Creatinine 1.1 Labs 11/07/13 K 3.6 Creatinine 0.91 Labs 12/11/13 K 4.5 Creatine 1.05  Allergies (verified):  1) ! Sulfa  2) Morphine   Past Medical History:  1. Diabetes mellitus, type II  2. Hyperlipidemia: She has been unable to tolerate statins (has been on Vytorin, lovastatin, and Crestor) due to what sounds like rhabdo: developed muscle weakness and was told she had "muscle damage" with each of these statins. She has been told not to take statins anymore.  3. Hypertension 4. Obesity  5. Restless Leg Syndrome  6. OA  7. peripheral neuropathy  8. OHS/OSA: Uses supplemental oxygen, does not tolerate CPAP.   9. Chronic nausea/diarrhea: ? IBS  10. Diastolic CHF: Echo (7/56) with normal LV size, moderate LVH, EF 65-70%, LV mid-cavity gradient reaching 63 mmHg with valsalva but minimal at rest, grade I diastolic dysfunction, cannot estimate PA systolic pressure (no TR doppler signal), RV normal. RHC (4/11): Mean RA 11, RV 37/13, PA 33/15, mean PCWP 12, CI 3.3. Echo in 11/12 showed mild LVH, EF 65-70%, no LV mid-cavity gradient was mentioned.  Echo in 4/14 showed EF 65-70%, mild LVH, grade I diastolic dysfunction, PA systolic pressure 35 mmHg, mild LV mid-cavity gradient.  Echo (5/15) with EF 60-65%, mild TR, normal RV size and systolic function.  11. ETT-myoview (4/11):  5'30", stopped due to fatigue, normal EF, no evidence for scar or ischemia. 12. V/Q scan 11/12: negative for PE.  Lower extremity venous doppler US (3/14): negative for DVT.  13. PFTs (5/13): FVC 50%, FEV1 62%, ratio 106%, DLCO 69%, TLC 69%.   Family History:  Family History of Alcoholism/Addiction (parent)  Family History Diabetes 1st degree relative (grandparent)  Family History High cholesterol (parent, grandparent)   Family History Hypertension (parent, grandparent)  Family History Lung cancer (grandparent)  Stomach cancer (grandmother)  Celiac Sprue daughter  Mother with MI at 19, father with MI at 21, uncle with MI at 44, brother with stents in his 19s, multiple aunts with MIs and CVAs   Social History:  Never Smoked  no alcohol  married, lives with spouse and her mother  retired Network engineer - now housewife  Alcohol Use - no  Illicit Drug Use - no  Review of Systems  All systems reviewed and negative except as per HPI.   ROS: All systems reviewed and negative except as per HPI.   Current Outpatient Prescriptions  Medication Sig Dispense Refill  . APIDRA 100 UNIT/ML injection INJECT 40 UNITS UNDER THE SKIN WITH BREAKFAST, 50 UNITS WITH LUNCH, AND 75 UNITS WITH DINNER 50 mL 0  . aspirin EC 81 MG tablet Take 81 mg by mouth daily.    . carvedilol (COREG) 25 MG tablet Take 1 tablet (25 mg total) by mouth 2 (two) times daily with a meal. 60 tablet 6  . cholecalciferol (VITAMIN D) 1000 UNITS tablet Take 1,000 Units by mouth daily.    . Coenzyme Q10 (COQ10) 30 MG CAPS Take 30 mg by mouth daily.     . DULoxetine (CYMBALTA) 60 MG capsule Take 1 capsule (60 mg total) by mouth daily. 90 capsule 3  . fenofibrate 160 MG tablet Take 160 mg by mouth daily.    . hydrALAZINE (APRESOLINE) 25 MG tablet Take 25 mg by mouth 3 (three) times daily.    . insulin glargine (LANTUS) 100 UNIT/ML injection Inject 60 Units into the skin at bedtime.    . insulin glulisine (APIDRA) 100 UNIT/ML injection Inject 50-70 Units into the skin 3 (three) times daily with meals. 50 units with breakfast; 60 units with lunch; 70 units with dinner    . Magnesium 200 MG TABS Take 200 mg by mouth daily.  60 each   . metolazone (ZAROXOLYN) 2.5 MG tablet Take 2.5 mg by mouth 3 (three) times a week. On Tuesdays, Thursdays, and Saturdays    . omega-3 acid ethyl esters (LOVAZA) 1 G capsule Take 2 g by mouth 2 (two) times daily.    [name]  oxyCODONE-acetaminophen (PERCOCET/ROXICET) 5-325 MG per tablet Take 1 tablet by mouth 2 (two) times daily. 1 tablet in AM and 1 tablet at bedtime    . potassium chloride SA (K-DUR,KLOR-CON) 20 MEQ tablet Take 40 mEq by mouth See admin instructions. Take 2 tablets (40 meq) 3 times daily on Tuesdays, Thursdays, and Saturdays. Take 2 tablets (40 meq) 2 times daily on Mondays, Wednesdays, Fridays, and Sundays.    . pregabalin (LYRICA) 100 MG capsule Take 1 capsule (100 mg total) by mouth 3 (three) times daily. 90 capsule 5  . rOPINIRole (REQUIP) 2 MG tablet Take 2 mg by mouth 2 (two) times daily.    [name] spironolactone (ALDACTONE) 25 MG tablet Take 1 tablet (25 mg total) by mouth daily. 30 tablet 6  . torsemide (DEMADEX) 20 MG tablet Take 80 mg by mouth 2 (two)  times daily.     No current facility-administered medications for this encounter.   Filed Vitals:   01/01/14 0902  BP: 150/70  Pulse: 76  Resp: 18  Weight: 270 lb 2 oz (122.528 kg)  SpO2: 98%    General: NAD, obese Husband present Neck: Thick, JVP difficult to assess d/t body habitus but does not appear mildly elevated. No thyromegaly or thyroid nodule.  Lungs: Clear to auscultation bilaterally with normal respiratory effort. Chronic 2L oxygen CV: Nondisplaced PMI.  Heart regular S1/S2, no S3/S4, 2/6 SEM RUSB.  Trace woody edema with chronic erythema.  No carotid bruit.  Abdomen: Soft, nontender, no hepatosplenomegaly, Obese Neurologic: Alert and oriented x 3.  Psych: Normal affect. Extremities: No clubbing/cyanosis, bilateral venous stasis RLE/LLE trace edema.     Assessment/Plan:  1. Chronic diastolic CHF:   - NYHA III symptoms and volume status mildly elevated. Have asked her to continue torsemide 80 mg BID and to increase metolazone to 2.5 mg QOD. On the days she takes metolazone take potassium 40 meq TID and on the days no metolazone 40 meq BID. - check BMET today. - Reinforced the need and importance of daily weights, a low  sodium diet, and fluid restriction (less than 2 L a day). Instructed to call the HF clinic if weight increases more than 3 lbs overnight or 5 lbs in a week.  2. Pulmonary: Suspect OHS/OSA.   - Followed by Dr Elsworth Soho. Per Dr Elsworth Soho no role for CPAP yet. Continue oxygen continuous. 3. Obesity - Have encouraged her to be more active and watch her portion control. Last visit discussed referral to [facility] nutrition and diabetes center, however was not interested.  4. CKD stage III - Baseline 1.3-1.6. Check BMET today.  5. HTN - SBP elevated. She has not taken medications today and have asked her to please take before next visit.   F/U 1 month Rande Brunt NP-C  01/01/2014

## 2014-01-01 NOTE — Telephone Encounter (Signed)
Reviewed labs and BUN elevated 69 and creatinine elevated 1.20. Will hold diuretics for one day and told to not increase metolazone to QOD but to leave 2.5 mg M/W/F and keep potassium 40 meq BID on days she doesn't take metolazone and 40 meq TID on days she takes metolazone. Pt aware.   Junie Bame B NP-C 5:05 PM

## 2014-01-01 NOTE — Addendum Note (Signed)
Addended by: Junie Bame B on: 01/01/2014 05:11 PM   Modules accepted: Orders

## 2014-01-03 ENCOUNTER — Encounter: Payer: Self-pay | Admitting: Internal Medicine

## 2014-01-03 DIAGNOSIS — Z794 Long term (current) use of insulin: Secondary | ICD-10-CM | POA: Diagnosis not present

## 2014-01-03 DIAGNOSIS — E669 Obesity, unspecified: Secondary | ICD-10-CM | POA: Diagnosis not present

## 2014-01-03 DIAGNOSIS — Z9981 Dependence on supplemental oxygen: Secondary | ICD-10-CM | POA: Diagnosis not present

## 2014-01-03 DIAGNOSIS — G4733 Obstructive sleep apnea (adult) (pediatric): Secondary | ICD-10-CM | POA: Diagnosis not present

## 2014-01-03 DIAGNOSIS — I5033 Acute on chronic diastolic (congestive) heart failure: Secondary | ICD-10-CM | POA: Diagnosis not present

## 2014-01-03 DIAGNOSIS — E119 Type 2 diabetes mellitus without complications: Secondary | ICD-10-CM | POA: Diagnosis not present

## 2014-01-08 ENCOUNTER — Telehealth (HOSPITAL_COMMUNITY): Payer: Self-pay | Admitting: Vascular Surgery

## 2014-01-08 DIAGNOSIS — Z9981 Dependence on supplemental oxygen: Secondary | ICD-10-CM | POA: Diagnosis not present

## 2014-01-08 DIAGNOSIS — Z794 Long term (current) use of insulin: Secondary | ICD-10-CM | POA: Diagnosis not present

## 2014-01-08 DIAGNOSIS — E119 Type 2 diabetes mellitus without complications: Secondary | ICD-10-CM | POA: Diagnosis not present

## 2014-01-08 DIAGNOSIS — G4733 Obstructive sleep apnea (adult) (pediatric): Secondary | ICD-10-CM | POA: Diagnosis not present

## 2014-01-08 DIAGNOSIS — E669 Obesity, unspecified: Secondary | ICD-10-CM | POA: Diagnosis not present

## 2014-01-08 DIAGNOSIS — I5033 Acute on chronic diastolic (congestive) heart failure: Secondary | ICD-10-CM | POA: Diagnosis not present

## 2014-01-08 NOTE — Telephone Encounter (Signed)
Pt weight is up TODAY 273.6.. LAST TIME 267.2  12/30/13 .[name] Pt is taking all medication she is has fluid in lower extremities.. Please advise

## 2014-01-09 DIAGNOSIS — G4733 Obstructive sleep apnea (adult) (pediatric): Secondary | ICD-10-CM | POA: Diagnosis not present

## 2014-01-09 DIAGNOSIS — Z9981 Dependence on supplemental oxygen: Secondary | ICD-10-CM | POA: Diagnosis not present

## 2014-01-09 DIAGNOSIS — I5033 Acute on chronic diastolic (congestive) heart failure: Secondary | ICD-10-CM | POA: Diagnosis not present

## 2014-01-09 DIAGNOSIS — E669 Obesity, unspecified: Secondary | ICD-10-CM | POA: Diagnosis not present

## 2014-01-09 DIAGNOSIS — E119 Type 2 diabetes mellitus without complications: Secondary | ICD-10-CM | POA: Diagnosis not present

## 2014-01-09 DIAGNOSIS — Z794 Long term (current) use of insulin: Secondary | ICD-10-CM | POA: Diagnosis not present

## 2014-01-10 ENCOUNTER — Other Ambulatory Visit (HOSPITAL_COMMUNITY): Payer: Self-pay | Admitting: Internal Medicine

## 2014-01-15 DIAGNOSIS — M545 Low back pain: Secondary | ICD-10-CM | POA: Diagnosis not present

## 2014-01-15 DIAGNOSIS — Z79891 Long term (current) use of opiate analgesic: Secondary | ICD-10-CM | POA: Diagnosis not present

## 2014-01-15 DIAGNOSIS — M791 Myalgia: Secondary | ICD-10-CM | POA: Diagnosis not present

## 2014-01-15 DIAGNOSIS — M1990 Unspecified osteoarthritis, unspecified site: Secondary | ICD-10-CM | POA: Diagnosis not present

## 2014-01-15 DIAGNOSIS — M2550 Pain in unspecified joint: Secondary | ICD-10-CM | POA: Diagnosis not present

## 2014-01-15 DIAGNOSIS — M353 Polymyalgia rheumatica: Secondary | ICD-10-CM | POA: Diagnosis not present

## 2014-01-15 DIAGNOSIS — G609 Hereditary and idiopathic neuropathy, unspecified: Secondary | ICD-10-CM | POA: Diagnosis not present

## 2014-01-15 DIAGNOSIS — Z79899 Other long term (current) drug therapy: Secondary | ICD-10-CM | POA: Diagnosis not present

## 2014-01-15 DIAGNOSIS — M1710 Unilateral primary osteoarthritis, unspecified knee: Secondary | ICD-10-CM | POA: Diagnosis not present

## 2014-01-15 DIAGNOSIS — G894 Chronic pain syndrome: Secondary | ICD-10-CM | POA: Diagnosis not present

## 2014-01-20 ENCOUNTER — Other Ambulatory Visit: Payer: Self-pay | Admitting: Endocrinology

## 2014-01-20 LAB — HM DIABETES EYE EXAM

## 2014-01-21 ENCOUNTER — Other Ambulatory Visit: Payer: Self-pay | Admitting: Endocrinology

## 2014-01-23 DIAGNOSIS — I5033 Acute on chronic diastolic (congestive) heart failure: Secondary | ICD-10-CM | POA: Diagnosis not present

## 2014-01-23 DIAGNOSIS — E669 Obesity, unspecified: Secondary | ICD-10-CM | POA: Diagnosis not present

## 2014-01-23 DIAGNOSIS — E119 Type 2 diabetes mellitus without complications: Secondary | ICD-10-CM | POA: Diagnosis not present

## 2014-01-23 DIAGNOSIS — Z794 Long term (current) use of insulin: Secondary | ICD-10-CM | POA: Diagnosis not present

## 2014-01-23 DIAGNOSIS — Z9981 Dependence on supplemental oxygen: Secondary | ICD-10-CM | POA: Diagnosis not present

## 2014-01-23 DIAGNOSIS — G4733 Obstructive sleep apnea (adult) (pediatric): Secondary | ICD-10-CM | POA: Diagnosis not present

## 2014-01-28 DIAGNOSIS — G4733 Obstructive sleep apnea (adult) (pediatric): Secondary | ICD-10-CM | POA: Diagnosis not present

## 2014-01-28 DIAGNOSIS — Z9981 Dependence on supplemental oxygen: Secondary | ICD-10-CM | POA: Diagnosis not present

## 2014-01-28 DIAGNOSIS — I5033 Acute on chronic diastolic (congestive) heart failure: Secondary | ICD-10-CM | POA: Diagnosis not present

## 2014-01-28 DIAGNOSIS — Z794 Long term (current) use of insulin: Secondary | ICD-10-CM | POA: Diagnosis not present

## 2014-01-28 DIAGNOSIS — E119 Type 2 diabetes mellitus without complications: Secondary | ICD-10-CM | POA: Diagnosis not present

## 2014-01-28 DIAGNOSIS — E669 Obesity, unspecified: Secondary | ICD-10-CM | POA: Diagnosis not present

## 2014-02-04 ENCOUNTER — Telehealth (HOSPITAL_COMMUNITY): Payer: Self-pay | Admitting: Vascular Surgery

## 2014-02-04 DIAGNOSIS — E119 Type 2 diabetes mellitus without complications: Secondary | ICD-10-CM | POA: Diagnosis not present

## 2014-02-04 DIAGNOSIS — Z794 Long term (current) use of insulin: Secondary | ICD-10-CM | POA: Diagnosis not present

## 2014-02-04 DIAGNOSIS — E669 Obesity, unspecified: Secondary | ICD-10-CM | POA: Diagnosis not present

## 2014-02-04 DIAGNOSIS — G4733 Obstructive sleep apnea (adult) (pediatric): Secondary | ICD-10-CM | POA: Diagnosis not present

## 2014-02-04 DIAGNOSIS — I5033 Acute on chronic diastolic (congestive) heart failure: Secondary | ICD-10-CM | POA: Diagnosis not present

## 2014-02-04 DIAGNOSIS — Z9981 Dependence on supplemental oxygen: Secondary | ICD-10-CM | POA: Diagnosis not present

## 2014-02-04 NOTE — Telephone Encounter (Signed)
VO given to [facility] RN for recert orders

## 2014-02-04 NOTE — Telephone Encounter (Signed)
Nurse called she needs recert orders for the pt.. Please advise

## 2014-02-05 ENCOUNTER — Encounter (HOSPITAL_COMMUNITY): Payer: Self-pay

## 2014-02-05 ENCOUNTER — Ambulatory Visit (INDEPENDENT_AMBULATORY_CARE_PROVIDER_SITE_OTHER): Payer: Medicare Other | Admitting: Endocrinology

## 2014-02-05 ENCOUNTER — Ambulatory Visit (HOSPITAL_COMMUNITY)
Admission: RE | Admit: 2014-02-05 | Discharge: 2014-02-05 | Disposition: A | Discharge status: Home / Self Care | Payer: Medicare Other | Source: Ambulatory Visit | Attending: Cardiology | Admitting: Cardiology

## 2014-02-05 ENCOUNTER — Encounter: Payer: Self-pay | Admitting: Endocrinology

## 2014-02-05 VITALS — BP 138/80 | HR 75 | Temp 98.5°F | Ht 59.0 in | Wt 268.0 lb

## 2014-02-05 VITALS — BP 141/56 | HR 78 | Resp 18 | Wt 267.2 lb

## 2014-02-05 DIAGNOSIS — Z794 Long term (current) use of insulin: Secondary | ICD-10-CM | POA: Diagnosis not present

## 2014-02-05 DIAGNOSIS — E785 Hyperlipidemia, unspecified: Secondary | ICD-10-CM | POA: Diagnosis not present

## 2014-02-05 DIAGNOSIS — I10 Essential (primary) hypertension: Secondary | ICD-10-CM | POA: Diagnosis not present

## 2014-02-05 DIAGNOSIS — E669 Obesity, unspecified: Secondary | ICD-10-CM | POA: Diagnosis not present

## 2014-02-05 DIAGNOSIS — G4733 Obstructive sleep apnea (adult) (pediatric): Secondary | ICD-10-CM | POA: Insufficient documentation

## 2014-02-05 DIAGNOSIS — M1990 Unspecified osteoarthritis, unspecified site: Secondary | ICD-10-CM | POA: Insufficient documentation

## 2014-02-05 DIAGNOSIS — N183 Chronic kidney disease, stage 3 (moderate): Secondary | ICD-10-CM | POA: Insufficient documentation

## 2014-02-05 DIAGNOSIS — G2581 Restless legs syndrome: Secondary | ICD-10-CM | POA: Diagnosis not present

## 2014-02-05 DIAGNOSIS — I129 Hypertensive chronic kidney disease with stage 1 through stage 4 chronic kidney disease, or unspecified chronic kidney disease: Secondary | ICD-10-CM | POA: Diagnosis not present

## 2014-02-05 DIAGNOSIS — G629 Polyneuropathy, unspecified: Secondary | ICD-10-CM | POA: Insufficient documentation

## 2014-02-05 DIAGNOSIS — I5032 Chronic diastolic (congestive) heart failure: Secondary | ICD-10-CM | POA: Insufficient documentation

## 2014-02-05 DIAGNOSIS — E1029 Type 1 diabetes mellitus with other diabetic kidney complication: Secondary | ICD-10-CM | POA: Diagnosis not present

## 2014-02-05 DIAGNOSIS — E119 Type 2 diabetes mellitus without complications: Secondary | ICD-10-CM | POA: Diagnosis not present

## 2014-02-05 DIAGNOSIS — Z7982 Long term (current) use of aspirin: Secondary | ICD-10-CM | POA: Insufficient documentation

## 2014-02-05 DIAGNOSIS — IMO0002 Reserved for concepts with insufficient information to code with codable children: Secondary | ICD-10-CM

## 2014-02-05 DIAGNOSIS — E1065 Type 1 diabetes mellitus with hyperglycemia: Principal | ICD-10-CM

## 2014-02-05 DIAGNOSIS — Z79899 Other long term (current) drug therapy: Secondary | ICD-10-CM | POA: Insufficient documentation

## 2014-02-05 LAB — BASIC METABOLIC PANEL
Anion gap: 16 — ABNORMAL HIGH (ref 5–15)
BUN: 75 mg/dL — ABNORMAL HIGH (ref 6–23)
CALCIUM: 9.9 mg/dL (ref 8.4–10.5)
CO2: 35 mEq/L — ABNORMAL HIGH (ref 19–32)
Chloride: 88 mEq/L — ABNORMAL LOW (ref 96–112)
Creatinine, Ser: 1.1 mg/dL (ref 0.50–1.10)
GFR calc Af Amer: 58 mL/min — ABNORMAL LOW (ref >90)
GFR calc non Af Amer: 50 mL/min — ABNORMAL LOW (ref >90)
GLUCOSE: 97 mg/dL (ref 70–99)
Potassium: 2.9 mEq/L — CL (ref 3.7–5.3)
Sodium: 139 mEq/L (ref 137–147)

## 2014-02-05 NOTE — Progress Notes (Signed)
Subjective:    Patient ID: Melissa Myers, female    DOB: 1945/06/28, 68 y.o.   MRN: [id number]  HPI Pt returns for f/u of diabetes mellitus: DM type: Insulin-requiring type 2.   Dx'ed: 7564 Complications: sensory neuropathy of the lower extremities, renal insufficiency, and leg ulcers Therapy: insulin since 2011 GDM: never DKA: never Severe hypoglycemia: never Pancreatitis: never Other: takes multiple daily injections; she is too ill to undergo weight loss surgery.   Interval history: no cbg record, but states cbg's vary from 109-240.  It is highest at hs, and lowest before lunch.  pt states she feels better in general, since her hospitalization.  Main symptom is chronic bilat knee pain.  She has mild hypoglycemia in the middle of the night approx once a week.    Past Medical History  Diagnosis Date  . Type II or unspecified type diabetes mellitus without mention of complication, not stated as uncontrolled     insulin dep  . Hyperlipidemia     hx rhabdo on statins  . Hypertension   . Diastolic CHF   . Chronic diarrhea     a/w nausea - felt related to IBS  . RLS (restless legs syndrome)   . Osteoarthritis   . Stasis dermatitis   . GERD (gastroesophageal reflux disease)   . On home oxygen therapy     uses oxygen 2 liters min per [place] at night and prn during day  . Anemia   . Neuropathy     feet, toes and fingers  . Disc degeneration, lumbar   . OSA (obstructive sleep apnea)     05/2009 sleep study - refuses CPAP  . Deaf     left side only  . Shortness of breath     chronic    Past Surgical History  Procedure Laterality Date  . Tubal ligation  1980  . Cholecystectomy  1997  . Uterine polyp removal  2008  . Umbilical hernia repair  1995  . Tonsillectomy  1970  . Colonoscopy N/A 12/03/2012    Procedure: COLONOSCOPY;  Surgeon: Lafayette Dragon, MD;  Location: [facility];  Service: Endoscopy;  Laterality: N/A;    History   Social History  . Marital Status:  Married    Spouse Name: N/A    Number of Children: N/A  . Years of Education: N/A   Occupational History  . Not on file.   Social History Main Topics  . Smoking status: Never Smoker   . Smokeless tobacco: Never Used  . Alcohol Use: No  . Drug Use: No  . Sexual Activity: No   Other Topics Concern  . Not on file   Social History Narrative   Lives with spouse and mother. Retired Network engineer, now housewife    Current Outpatient Prescriptions on File Prior to Visit  Medication Sig Dispense Refill  . aspirin EC 81 MG tablet Take 81 mg by mouth daily.    . carvedilol (COREG) 25 MG tablet Take 1 tablet (25 mg total) by mouth 2 (two) times daily with a meal. 60 tablet 6  . cholecalciferol (VITAMIN D) 1000 UNITS tablet Take 1,000 Units by mouth daily.    . Coenzyme Q10 (COQ10) 30 MG CAPS Take 30 mg by mouth daily.     . DULoxetine (CYMBALTA) 60 MG capsule Take 1 capsule (60 mg total) by mouth daily. 90 capsule 3  . fenofibrate 160 MG tablet Take 160 mg by mouth daily.    . hydrALAZINE (APRESOLINE)  25 MG tablet Take 25 mg by mouth 3 (three) times daily.    . insulin glargine (LANTUS) 100 UNIT/ML injection Inject 50 Units into the skin at bedtime.     . Magnesium 200 MG TABS Take 200 mg by mouth daily.  60 each   . metolazone (ZAROXOLYN) 2.5 MG tablet Take 1 tablet (2.5 mg total) by mouth every Monday, Wednesday, and Friday. 20 tablet 3  . omega-3 acid ethyl esters (LOVAZA) 1 G capsule Take 2 g by mouth 2 (two) times daily.    [name] oxyCODONE-acetaminophen (PERCOCET/ROXICET) 5-325 MG per tablet Take 1 tablet by mouth 2 (two) times daily. 1 tablet in AM and 1 tablet at bedtime    . potassium chloride SA (K-DUR,KLOR-CON) 20 MEQ tablet Take 2 tablets (40 meq) 3 times daily on metolazone days; on days no metolazone 2 tablets (40 meq) twice a day. 145 tablet 3  . pregabalin (LYRICA) 100 MG capsule Take 1 capsule (100 mg total) by mouth 3 (three) times daily. 90 capsule 5  . rOPINIRole (REQUIP) 2 MG  tablet Take 2 mg by mouth 2 (two) times daily.    [name] spironolactone (ALDACTONE) 25 MG tablet Take 1 tablet (25 mg total) by mouth daily. 30 tablet 6  . torsemide (DEMADEX) 20 MG tablet TAKE 4 TABLETS BY MOUTH TWICE DAILY 240 tablet 0   No current facility-administered medications on file prior to visit.    Allergies  Allergen Reactions  . Statins Other (See Comments)    Statin drugs cause muscle pain / "muscle damage"--was told by MD not to take  . Sulfa Antibiotics Diarrhea  . Levemir [Insulin Detemir] Itching  . Morphine Other (See Comments)    GI upset and headaches  . Zinc Swelling and Rash    Family History  Problem Relation Age of Onset  . Heart disease Mother   . Heart disease Father   . Heart disease      family history  . Stomach cancer Paternal Grandmother   . Lung cancer Paternal Grandfather   . Colon cancer Neg Hx   . CVA      several aunts  . Heart attack Mother 28  . Heart attack Father 19  . Heart attack      several aunts and an uncle    BP 138/80 mmHg  Pulse 75  Temp(Src) 98.5 F (36.9 C) (Oral)  Ht 4\' 11"  (1.499 m)  Wt 268 lb (121.564 kg)  BMI 54.10 kg/m2  SpO2 95%    Review of Systems She denies LOC and weight change.      Objective:   Physical Exam VITAL SIGNS:  See vs page.   GENERAL: no distress.  Morbid obesity.  Pulses: dorsalis pedis intact bilat.   Feet: no deformity.  1+ bilat leg edema.   Skin:  no ulcer on the feet.  normal temp.  bilat spotty hyperpigmentation of the legs and feet.   Neuro: sensation is intact to touch on the feet, but decreased from normal.     Lab Results  Component Value Date   HGBA1C 7.1* 12/05/2013      Assessment & Plan:  DM: well-controlled Side-effect of rx" hypoglycemia, mild. Noncompliance with cbg recording: I'll work around this as best I can   Patient is advised the following: Patient Instructions  Please decrease the lantus to 50 units at bedtime.   Please continue the same apidra--i  have changed to humalog for your new insurance starting 02/21/14. check your blood  sugar twice a day.  vary the time of day when you check, between before the 3 meals, and at bedtime.  also check if you have symptoms of your blood sugar being too high or too low.  please keep a record of the readings and bring it to your next appointment here.  You can write it on any piece of paper.  please call us sooner if your blood sugar goes below 70, or if you have a lot of readings over 200.  Please come back for a follow-up appointment in 2 months.

## 2014-02-05 NOTE — Progress Notes (Signed)
Patient ID: Melissa Myers, female   DOB: [birth year], 68 y.o.   MRN: [id number]  PCP: Dr. Asa Lente Endocrinologist: Dr Loanne Drilling  68 yo with history of HTN, DM, hyperlipidemia, OHS/OSA, and chronic dyspnea/diastolic CHF.  Patient had an echo in 04/2009 showing moderate LVH and preserved LV systolic function. However, there was a very large LV mid-cavity gradient with valsalva.  Patient developed quite significant exertional dyspnea to the point where she was short of breath walking around her house. She had a pulmonary evaluation with Dr. Elsworth Soho but no primary lung problems were identified. Patient had an ETT-myoview in 05/2009 which was negative for ischemia or infarction. Right heart cath in 05/2009 showed mildly elevated right heart filling pressures but normal PA pressure and normal PCWP. She was started her on a beta blocker (Coreg) to try to lower her LV mid-cavity gradient (that likely occurs with exertion) and to better control BP.  V/Q scan was negative for PE.  PFTs from 06/2011 showed a restrictive defect. Last echo in 06/2013 showed EF 60-65% with normal RV size and systolic function.      Admitted 12/05/13 with volume overload. Diursed with IV lasix and transitioned to torsemide 80 mg twice ad ay + metolazone. Overall she diuresed 21 pounds. Discharge weight was 263 pounds.   Follow up for Heart Failure: Last visit volume status was elevated and increased metolazone to 2.5 mg QOD, however since visit had labs and BUN elevated and cut metolazone back to M/W/F but she is still taking QOD. Overall doing ok other than her knees hurting. Denies SOB, orthopnea, PND or CP. Wears chronic 2L O2 24 hrs/day. Minimal SOB with activity. Reports she is walking around the house with no issues. Does not leave the house much. Not sleeping very well. Following a low salt diet and most of the time drinking less than 2L a day. Weight at home 267 -270 lbs.   Labs (10/13): K 4.1, creatinine 0.85 Labs (4/14): K 3.7,  creatinine 1.2, BUN 43, BNP 23 Labs (5/14): K 4.1, creatinine 1.0 Labs (7/14): K 4.6 Creatinine 1.0 BNP 39.0  Labs (8/14): K 3.5, creatinine 0.91, BNP 71 Labs (11/14): K 3.7, creatinine 0.9 Labs (12/14): K 4.1, creatinine 0.8, BNP 52 Labs (3/15): K 4, creatinine 0.9 Labs (4/15): K 3.7, creatinine 0.9 Labs (5/15): K 3.8, creatinine 0.9, BNP 29 Labs (6/15): K 3.4, BUN 105, creatinine 0.9 =>1.7 Labs (6/17/5) K 4.2, BUN 41, Creatinine 0.80 Labs (08/19/13) K 3.2, BUN 71, creatinine 1.5 Labs 09/05/13 K 4.1 Creatinine 1.1 Labs 11/07/13 K 3.6 Creatinine 0.91 Labs 12/11/13 K 4.5 Creatine 1.05  Allergies (verified):  1) ! Sulfa  2) Morphine   Past Medical History:  1. Diabetes mellitus, type II  2. Hyperlipidemia: She has been unable to tolerate statins (has been on Vytorin, lovastatin, and Crestor) due to what sounds like rhabdo: developed muscle weakness and was told she had "muscle damage" with each of these statins. She has been told not to take statins anymore.  3. Hypertension 4. Obesity  5. Restless Leg Syndrome  6. OA  7. peripheral neuropathy  8. OHS/OSA: Uses supplemental oxygen, does not tolerate CPAP.   9. Chronic nausea/diarrhea: ? IBS  10. Diastolic CHF: Echo (7/82) with normal LV size, moderate LVH, EF 65-70%, LV mid-cavity gradient reaching 63 mmHg with valsalva but minimal at rest, grade I diastolic dysfunction, cannot estimate PA systolic pressure (no TR doppler signal), RV normal. RHC (4/11): Mean RA 11, RV 37/13, PA 33/15, mean PCWP  12, CI 3.3. Echo in 11/12 showed mild LVH, EF 65-70%, no LV mid-cavity gradient was mentioned.  Echo in 4/14 showed EF 65-70%, mild LVH, grade I diastolic dysfunction, PA systolic pressure 35 mmHg, mild LV mid-cavity gradient.  Echo (5/15) with EF 60-65%, mild TR, normal RV size and systolic function.  11. ETT-myoview (4/11): 5'30", stopped due to fatigue, normal EF, no evidence for scar or ischemia. 12. V/Q scan 11/12: negative for PE.  Lower  extremity venous doppler US (3/14): negative for DVT.  13. PFTs (5/13): FVC 50%, FEV1 62%, ratio 106%, DLCO 69%, TLC 69%.   Family History:  Family History of Alcoholism/Addiction (parent)  Family History Diabetes 1st degree relative (grandparent)  Family History High cholesterol (parent, grandparent)  Family History Hypertension (parent, grandparent)  Family History Lung cancer (grandparent)  Stomach cancer (grandmother)  Celiac Sprue daughter  Mother with MI at 68, father with MI at 11, uncle with MI at 68, brother with stents in his 68s, multiple aunts with MIs and CVAs   Social History:  Never Smoked  no alcohol  married, lives with spouse and her mother  retired Network engineer - now housewife  Alcohol Use - no  Illicit Drug Use - no  Review of Systems  All systems reviewed and negative except as per HPI.   ROS: All systems reviewed and negative except as per HPI.   Current Outpatient Prescriptions  Medication Sig Dispense Refill  . APIDRA 100 UNIT/ML injection INJECT 40 UNITS UNDER THE SKIN WITH BREAKFAST, 50 UNITS WITH LUNCH AND 75 UNITS WITH DINNER 50 mL 0  . aspirin EC 81 MG tablet Take 81 mg by mouth daily.    . carvedilol (COREG) 25 MG tablet Take 1 tablet (25 mg total) by mouth 2 (two) times daily with a meal. 60 tablet 6  . cholecalciferol (VITAMIN D) 1000 UNITS tablet Take 1,000 Units by mouth daily.    . Coenzyme Q10 (COQ10) 30 MG CAPS Take 30 mg by mouth daily.     . DULoxetine (CYMBALTA) 60 MG capsule Take 1 capsule (60 mg total) by mouth daily. 90 capsule 3  . fenofibrate 160 MG tablet Take 160 mg by mouth daily.    . hydrALAZINE (APRESOLINE) 25 MG tablet Take 25 mg by mouth 3 (three) times daily.    . insulin glargine (LANTUS) 100 UNIT/ML injection Inject 60 Units into the skin at bedtime.    . insulin glulisine (APIDRA) 100 UNIT/ML injection Inject 50-70 Units into the skin 3 (three) times daily with meals. 50 units with breakfast; 60 units with lunch; 70 units with  dinner    . Magnesium 200 MG TABS Take 200 mg by mouth daily.  60 each   . metolazone (ZAROXOLYN) 2.5 MG tablet Take 1 tablet (2.5 mg total) by mouth every Monday, Wednesday, and Friday. 20 tablet 3  . omega-3 acid ethyl esters (LOVAZA) 1 G capsule Take 2 g by mouth 2 (two) times daily.    [name] oxyCODONE-acetaminophen (PERCOCET/ROXICET) 5-325 MG per tablet Take 1 tablet by mouth 2 (two) times daily. 1 tablet in AM and 1 tablet at bedtime    . potassium chloride SA (K-DUR,KLOR-CON) 20 MEQ tablet Take 2 tablets (40 meq) 3 times daily on metolazone days; on days no metolazone 2 tablets (40 meq) twice a day. 145 tablet 3  . pregabalin (LYRICA) 100 MG capsule Take 1 capsule (100 mg total) by mouth 3 (three) times daily. 90 capsule 5  . rOPINIRole (REQUIP) 2 MG tablet  Take 2 mg by mouth 2 (two) times daily.    [name] spironolactone (ALDACTONE) 25 MG tablet Take 1 tablet (25 mg total) by mouth daily. 30 tablet 6  . torsemide (DEMADEX) 20 MG tablet TAKE 4 TABLETS BY MOUTH TWICE DAILY 240 tablet 0   No current facility-administered medications for this encounter.   Filed Vitals:   02/05/14 1410  BP: 141/56  Pulse: 78  Resp: 18  Weight: 267 lb 4 oz (121.224 kg)  SpO2: 94%    General: NAD, obese Husband present Neck: Thick, JVP difficult to assess d/t body habitus but does not appear elevated. No thyromegaly or thyroid nodule.  Lungs: Clear to auscultation bilaterally with normal respiratory effort. Chronic 2L oxygen CV: Nondisplaced PMI.  Heart regular S1/S2, no S3/S4, 2/6 SEM RUSB.  Trace woody edema with chronic erythema.  No carotid bruit.  Abdomen: Soft, nontender, no hepatosplenomegaly, Obese Neurologic: Alert and oriented x 3.  Psych: Normal affect. Extremities: No clubbing/cyanosis, bilateral venous stasis RLE/LLE trace edema.     Assessment/Plan:  1. Chronic diastolic CHF:   - NYHA III symptoms and volume status stable. Will continue torsemide 80 mg BID and metolazone 2.5 mg QOD. Continue KCL  40 meq BID and days of metolazone take 40 meq TID. Check BMET today.  - SBP stable.  - Reinforced the need and importance of daily weights, a low sodium diet, and fluid restriction (less than 2 L a day). Instructed to call the HF clinic if weight increases more than 3 lbs overnight or 5 lbs in a week.  2. Pulmonary: Suspect OHS/OSA.   - Followed by Dr Elsworth Soho. Per Dr Elsworth Soho no role for CPAP yet. Continue oxygen continuous. 3. Obesity - She is down a few pounds from last visit and congratulated patient on success. Have encouraged her to be more active and watch her portion control. A lot of her knee pain is contributed to her weight.  4. CKD stage III - Baseline 1.3-1.6. Check BMET today.  5. HTN - SBP stable. Continue to follow.   F/U 6 weeks.  Junie Bame B NP-C  02/05/2014  F/U 6 weeks BMET

## 2014-02-05 NOTE — Patient Instructions (Addendum)
Please decrease the lantus to 50 units at bedtime.   Please continue the same apidra--i have changed to humalog for your new insurance starting 02/21/14. check your blood sugar twice a day.  vary the time of day when you check, between before the 3 meals, and at bedtime.  also check if you have symptoms of your blood sugar being too high or too low.  please keep a record of the readings and bring it to your next appointment here.  You can write it on any piece of paper.  please call us sooner if your blood sugar goes below 70, or if you have a lot of readings over 200.  Please come back for a follow-up appointment in 2 months.

## 2014-02-05 NOTE — Patient Instructions (Signed)
Doing great.  Follow up in 6 weeks with Dr. Aundra Dubin.  Call any issues.  Have a wonderful Christmas and New Years.  Do the following things EVERYDAY: 1) Weigh yourself in the morning before breakfast. Write it down and keep it in a log. 2) Take your medicines as prescribed 3) Eat low salt foods-Limit salt (sodium) to 2000 mg per day.  4) Stay as active as you can everyday 5) Limit all fluids for the day to less than 2 liters 6)

## 2014-02-06 ENCOUNTER — Telehealth (HOSPITAL_COMMUNITY): Payer: Self-pay

## 2014-02-06 ENCOUNTER — Encounter (HOSPITAL_COMMUNITY): Payer: Self-pay

## 2014-02-06 NOTE — Telephone Encounter (Signed)
Critical K of 2.9 from yesterday, patient was unable to reach at that time despite multiple attempts.  Patient reached by phone today and instructed per Junie Bame NP-C to take extra 40 meq potassium today and tomorrow and return to clinic Monday to have lab repeated.  Patient feels good otherwise despite leg cramps occasionally.  Aware and agreeable.

## 2014-02-10 ENCOUNTER — Ambulatory Visit (HOSPITAL_COMMUNITY)
Admission: RE | Admit: 2014-02-10 | Discharge: 2014-02-10 | Disposition: A | Discharge status: Home / Self Care | Payer: Medicare Other | Source: Ambulatory Visit | Attending: Internal Medicine | Admitting: Internal Medicine

## 2014-02-10 DIAGNOSIS — I5033 Acute on chronic diastolic (congestive) heart failure: Secondary | ICD-10-CM | POA: Diagnosis not present

## 2014-02-10 DIAGNOSIS — I509 Heart failure, unspecified: Secondary | ICD-10-CM | POA: Diagnosis not present

## 2014-02-10 DIAGNOSIS — I50812 Chronic right heart failure: Secondary | ICD-10-CM

## 2014-02-10 DIAGNOSIS — Z9981 Dependence on supplemental oxygen: Secondary | ICD-10-CM | POA: Diagnosis not present

## 2014-02-10 DIAGNOSIS — G4733 Obstructive sleep apnea (adult) (pediatric): Secondary | ICD-10-CM | POA: Diagnosis not present

## 2014-02-10 DIAGNOSIS — E669 Obesity, unspecified: Secondary | ICD-10-CM | POA: Diagnosis not present

## 2014-02-10 DIAGNOSIS — E119 Type 2 diabetes mellitus without complications: Secondary | ICD-10-CM | POA: Diagnosis not present

## 2014-02-10 DIAGNOSIS — I5022 Chronic systolic (congestive) heart failure: Secondary | ICD-10-CM | POA: Diagnosis not present

## 2014-02-10 DIAGNOSIS — Z794 Long term (current) use of insulin: Secondary | ICD-10-CM | POA: Diagnosis not present

## 2014-02-10 LAB — BASIC METABOLIC PANEL
Anion gap: 16 — ABNORMAL HIGH (ref 5–15)
BUN: 56 mg/dL — ABNORMAL HIGH (ref 6–23)
CALCIUM: 9.6 mg/dL (ref 8.4–10.5)
CO2: 32 meq/L (ref 19–32)
CREATININE: 1.03 mg/dL (ref 0.50–1.10)
Chloride: 90 mEq/L — ABNORMAL LOW (ref 96–112)
GFR calc Af Amer: 63 mL/min — ABNORMAL LOW (ref >90)
GFR calc non Af Amer: 55 mL/min — ABNORMAL LOW (ref >90)
Glucose, Bld: 290 mg/dL — ABNORMAL HIGH (ref 70–99)
Potassium: 3.8 mEq/L (ref 3.7–5.3)
SODIUM: 138 meq/L (ref 137–147)

## 2014-02-11 ENCOUNTER — Other Ambulatory Visit (HOSPITAL_COMMUNITY): Payer: Self-pay | Admitting: Adult Health

## 2014-02-11 ENCOUNTER — Other Ambulatory Visit: Payer: Self-pay | Admitting: Internal Medicine

## 2014-02-12 DIAGNOSIS — M2550 Pain in unspecified joint: Secondary | ICD-10-CM | POA: Diagnosis not present

## 2014-02-12 DIAGNOSIS — M25569 Pain in unspecified knee: Secondary | ICD-10-CM | POA: Diagnosis not present

## 2014-02-12 DIAGNOSIS — G609 Hereditary and idiopathic neuropathy, unspecified: Secondary | ICD-10-CM | POA: Diagnosis not present

## 2014-02-12 DIAGNOSIS — Z79899 Other long term (current) drug therapy: Secondary | ICD-10-CM | POA: Diagnosis not present

## 2014-02-12 DIAGNOSIS — G5790 Unspecified mononeuropathy of unspecified lower limb: Secondary | ICD-10-CM | POA: Diagnosis not present

## 2014-02-12 DIAGNOSIS — M1710 Unilateral primary osteoarthritis, unspecified knee: Secondary | ICD-10-CM | POA: Diagnosis not present

## 2014-02-12 DIAGNOSIS — M353 Polymyalgia rheumatica: Secondary | ICD-10-CM | POA: Diagnosis not present

## 2014-02-12 DIAGNOSIS — Z79891 Long term (current) use of opiate analgesic: Secondary | ICD-10-CM | POA: Diagnosis not present

## 2014-02-12 DIAGNOSIS — G894 Chronic pain syndrome: Secondary | ICD-10-CM | POA: Diagnosis not present

## 2014-02-12 DIAGNOSIS — M1990 Unspecified osteoarthritis, unspecified site: Secondary | ICD-10-CM | POA: Diagnosis not present

## 2014-02-18 ENCOUNTER — Other Ambulatory Visit: Payer: Self-pay | Admitting: Endocrinology

## 2014-02-20 ENCOUNTER — Other Ambulatory Visit (HOSPITAL_COMMUNITY): Payer: Self-pay | Admitting: Internal Medicine

## 2014-02-21 DIAGNOSIS — I5033 Acute on chronic diastolic (congestive) heart failure: Secondary | ICD-10-CM | POA: Diagnosis not present

## 2014-02-21 DIAGNOSIS — Z794 Long term (current) use of insulin: Secondary | ICD-10-CM | POA: Diagnosis not present

## 2014-02-21 DIAGNOSIS — E119 Type 2 diabetes mellitus without complications: Secondary | ICD-10-CM | POA: Diagnosis not present

## 2014-02-21 DIAGNOSIS — E669 Obesity, unspecified: Secondary | ICD-10-CM | POA: Diagnosis not present

## 2014-02-21 DIAGNOSIS — G4733 Obstructive sleep apnea (adult) (pediatric): Secondary | ICD-10-CM | POA: Diagnosis not present

## 2014-02-21 DIAGNOSIS — Z9981 Dependence on supplemental oxygen: Secondary | ICD-10-CM | POA: Diagnosis not present

## 2014-02-25 DIAGNOSIS — I5033 Acute on chronic diastolic (congestive) heart failure: Secondary | ICD-10-CM | POA: Diagnosis not present

## 2014-02-25 DIAGNOSIS — Z9981 Dependence on supplemental oxygen: Secondary | ICD-10-CM | POA: Diagnosis not present

## 2014-02-25 DIAGNOSIS — E669 Obesity, unspecified: Secondary | ICD-10-CM | POA: Diagnosis not present

## 2014-02-25 DIAGNOSIS — G4733 Obstructive sleep apnea (adult) (pediatric): Secondary | ICD-10-CM | POA: Diagnosis not present

## 2014-02-25 DIAGNOSIS — E119 Type 2 diabetes mellitus without complications: Secondary | ICD-10-CM | POA: Diagnosis not present

## 2014-02-25 DIAGNOSIS — Z794 Long term (current) use of insulin: Secondary | ICD-10-CM | POA: Diagnosis not present

## 2014-03-06 ENCOUNTER — Encounter: Payer: Self-pay | Admitting: Internal Medicine

## 2014-03-06 ENCOUNTER — Ambulatory Visit (INDEPENDENT_AMBULATORY_CARE_PROVIDER_SITE_OTHER): Payer: Medicare Other | Admitting: Internal Medicine

## 2014-03-06 DIAGNOSIS — I10 Essential (primary) hypertension: Secondary | ICD-10-CM

## 2014-03-06 DIAGNOSIS — E1029 Type 1 diabetes mellitus with other diabetic kidney complication: Secondary | ICD-10-CM

## 2014-03-06 DIAGNOSIS — Z23 Encounter for immunization: Secondary | ICD-10-CM

## 2014-03-06 DIAGNOSIS — Z6841 Body Mass Index (BMI) 40.0 and over, adult: Secondary | ICD-10-CM | POA: Diagnosis not present

## 2014-03-06 DIAGNOSIS — E1065 Type 1 diabetes mellitus with hyperglycemia: Secondary | ICD-10-CM

## 2014-03-06 DIAGNOSIS — IMO0002 Reserved for concepts with insufficient information to code with codable children: Secondary | ICD-10-CM

## 2014-03-06 NOTE — Patient Instructions (Addendum)
It was good to see you today.  We have reviewed your prior records including labs and tests today  Tetanus immunization updated today. He will be due again for this booster in 10 years  Medications reviewed and updated, no changes recommended at this time.  Continue working with your other specialists as ongoing  Please schedule followup in 4-6 months, call sooner if problems.  Diabetes and Exercise Exercising regularly is important. It is not just about losing weight. It has many health benefits, such as:  Improving your overall fitness, flexibility, and endurance.  Increasing your bone density.  Helping with weight control.  Decreasing your body fat.  Increasing your muscle strength.  Reducing stress and tension.  Improving your overall health. People with diabetes who exercise gain additional benefits because exercise:  Reduces appetite.  Improves the body's use of blood sugar (glucose).  Helps lower or control blood glucose.  Decreases blood pressure.  Helps control blood lipids (such as cholesterol and triglycerides).  Improves the body's use of the hormone insulin by:  Increasing the body's insulin sensitivity.  Reducing the body's insulin needs.  Decreases the risk for heart disease because exercising:  Lowers cholesterol and triglycerides levels.  Increases the levels of good cholesterol (such as high-density lipoproteins [HDL]) in the body.  Lowers blood glucose levels. YOUR ACTIVITY PLAN  Choose an activity that you enjoy and set realistic goals. Your health care provider or diabetes educator can help you make an activity plan that works for you. Exercise regularly as directed by your health care provider. This includes:  Performing resistance training twice a week such as push-ups, sit-ups, lifting weights, or using resistance bands.  Performing 150 minutes of cardio exercises each week such as walking, running, or playing sports.  Staying active  and spending no more than 90 minutes at one time being inactive. Even short bursts of exercise are good for you. Three 10-minute sessions spread throughout the day are just as beneficial as a single 30-minute session. Some exercise ideas include:  Taking the dog for a walk.  Taking the stairs instead of the elevator.  Dancing to your favorite song.  Doing an exercise video.  Doing your favorite exercise with a friend. RECOMMENDATIONS FOR EXERCISING WITH TYPE 1 OR TYPE 2 DIABETES   Check your blood glucose before exercising. If blood glucose levels are greater than 240 mg/dL, check for urine ketones. Do not exercise if ketones are present.  Avoid injecting insulin into areas of the body that are going to be exercised. For example, avoid injecting insulin into:  The arms when playing tennis.  The legs when jogging.  Keep a record of:  Food intake before and after you exercise.  Expected peak times of insulin action.  Blood glucose levels before and after you exercise.  The type and amount of exercise you have done.  Review your records with your health care provider. Your health care provider will help you to develop guidelines for adjusting food intake and insulin amounts before and after exercising.  If you take insulin or oral hypoglycemic agents, watch for signs and symptoms of hypoglycemia. They include:  Dizziness.  Shaking.  Sweating.  Chills.  Confusion.  Drink plenty of water while you exercise to prevent dehydration or heat stroke. Body water is lost during exercise and must be replaced.  Talk to your health care provider before starting an exercise program to make sure it is safe for you. Remember, almost any type of activity is better  than none. Document Released: 04/30/2003 Document Revised: 06/24/2013 Document Reviewed: 07/17/2012 [facility] Patient Information 2015 [place], [place]. This information is not intended to replace advice given to you by your  health care provider. Make sure you discuss any questions you have with your health care provider.

## 2014-03-06 NOTE — Progress Notes (Signed)
Subjective:    Patient ID: Melissa Myers, female    DOB: May 10, 1945, 69 y.o.   MRN: [id number]  HPI  Patient is here for follow up  Reviewed chronic medical issues and interval medical events  Past Medical History  Diagnosis Date  . Type II or unspecified type diabetes mellitus without mention of complication, not stated as uncontrolled     insulin dep  . Hyperlipidemia     hx rhabdo on statins  . Hypertension   . Diastolic CHF   . Chronic diarrhea     a/w nausea - felt related to IBS  . RLS (restless legs syndrome)   . Osteoarthritis   . Stasis dermatitis   . GERD (gastroesophageal reflux disease)   . On home oxygen therapy     uses oxygen 2 liters min per [place] at night and prn during day  . Anemia   . Neuropathy     feet, toes and fingers  . Disc degeneration, lumbar   . OSA (obstructive sleep apnea)     05/2009 sleep study - refuses CPAP  . Deaf     left side only  . Shortness of breath     chronic    Review of Systems  Constitutional: Positive for fatigue (chronic). Negative for fever.  Respiratory: Positive for shortness of breath (chronic, at baseline -worse with exertion). Negative for cough.   Cardiovascular: Negative for chest pain and leg swelling.       Objective:   Physical Exam  BP 130/60 mmHg  Pulse 70  Temp(Src) 98.2 F (36.8 C) (Oral)  Ht 4\' 11"  (1.499 m)  Wt 278 lb (126.1 kg)  BMI 56.12 kg/m2  SpO2 93% Wt Readings from Last 3 Encounters:  03/06/14 278 lb (126.1 kg)  02/05/14 268 lb (121.564 kg)  12/11/13 263 lb 11.2 oz (119.614 kg)   Constitutional: She is MO, appears well-developed and well-nourished. No distress. spouse at side Neck: Normal range of motion. Neck supple. No JVD present. No thyromegaly present.  Cardiovascular: Normal rate, regular rhythm and normal heart sounds.  No murmur heard. chronic fatty ankles and venous dermatitis change, but nNo BLE edema. Pulmonary/Chest: Effort normal and breath sounds normal. No  respiratory distress. She has no wheezes.  Psychiatric: She has a normal mood and affect. Her behavior is normal. Judgment and thought content normal.   Lab Results  Component Value Date   WBC 6.6 12/09/2013   HGB 11.2* 12/09/2013   HCT 35.5* 12/09/2013   PLT 270 12/09/2013   GLUCOSE 290* 02/10/2014   CHOL 366* 08/19/2013   TRIG 382.0* 08/19/2013   HDL 51.80 08/19/2013   LDLDIRECT 127.5 08/03/2012   LDLCALC 238* 08/19/2013   ALT 19 12/05/2013   AST 21 12/05/2013   NA 138 02/10/2014   K 3.8 02/10/2014   CL 90* 02/10/2014   CREATININE 1.03 02/10/2014   BUN 56* 02/10/2014   CO2 32 02/10/2014   TSH 2.010 12/05/2013   INR 1.10 07/11/2012   HGBA1C 7.1* 12/05/2013   MICROALBUR 0.2 11/08/2013    No results found.     Assessment & Plan:   Problem List Items Addressed This Visit    Essential hypertension    BP Readings from Last 3 Encounters:  03/06/14 130/60  02/05/14 138/80  12/11/13 140/67   The current medical regimen is effective;  continue present plan and medications.      Morbid obesity - Primary    Wt Readings from Last 3 Encounters:  03/06/14 278 lb (126.1 kg)  02/05/14 268 lb (121.564 kg)  12/11/13 263 lb 11.2 oz (119.614 kg)   The patient is asked to make an attempt to improve diet and exercise patterns to aid in medical management of this problem.       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

## 2014-03-06 NOTE — Progress Notes (Signed)
Pre visit review using our clinic review tool, if applicable. No additional management support is needed unless otherwise documented below in the visit note. 

## 2014-03-06 NOTE — Assessment & Plan Note (Signed)
Has followed with various endo docs: previously [facility], [place] and [facility], currently with Loanne Drilling Currently on TID aprida, changing to Humalog in 2016 because of formulary changes Reviewed importance of diet with aerobic exercise and intentional weight loss Follow-up with endocrinology for labs and medication management  Lab Results  Component Value Date   HGBA1C 7.1* 12/05/2013

## 2014-03-06 NOTE — Assessment & Plan Note (Signed)
Wt Readings from Last 3 Encounters:  03/06/14 278 lb (126.1 kg)  02/05/14 268 lb (121.564 kg)  12/11/13 263 lb 11.2 oz (119.614 kg)   The patient is asked to make an attempt to improve diet and exercise patterns to aid in medical management of this problem.

## 2014-03-06 NOTE — Assessment & Plan Note (Signed)
BP Readings from Last 3 Encounters:  03/06/14 130/60  02/05/14 138/80  12/11/13 140/67   The current medical regimen is effective;  continue present plan and medications.

## 2014-03-07 ENCOUNTER — Telehealth: Payer: Self-pay | Admitting: Internal Medicine

## 2014-03-07 NOTE — Telephone Encounter (Signed)
emmi emailed °

## 2014-03-12 DIAGNOSIS — E119 Type 2 diabetes mellitus without complications: Secondary | ICD-10-CM | POA: Diagnosis not present

## 2014-03-12 DIAGNOSIS — Z9981 Dependence on supplemental oxygen: Secondary | ICD-10-CM | POA: Diagnosis not present

## 2014-03-12 DIAGNOSIS — E669 Obesity, unspecified: Secondary | ICD-10-CM | POA: Diagnosis not present

## 2014-03-12 DIAGNOSIS — Z794 Long term (current) use of insulin: Secondary | ICD-10-CM | POA: Diagnosis not present

## 2014-03-12 DIAGNOSIS — G4733 Obstructive sleep apnea (adult) (pediatric): Secondary | ICD-10-CM | POA: Diagnosis not present

## 2014-03-12 DIAGNOSIS — I5033 Acute on chronic diastolic (congestive) heart failure: Secondary | ICD-10-CM | POA: Diagnosis not present

## 2014-03-17 ENCOUNTER — Ambulatory Visit (HOSPITAL_COMMUNITY)
Admission: RE | Admit: 2014-03-17 | Discharge: 2014-03-17 | Disposition: A | Discharge status: Home / Self Care | Payer: Medicare Other | Source: Ambulatory Visit | Attending: Internal Medicine | Admitting: Internal Medicine

## 2014-03-17 VITALS — BP 138/58 | HR 75 | Wt 275.4 lb

## 2014-03-17 DIAGNOSIS — E669 Obesity, unspecified: Secondary | ICD-10-CM | POA: Insufficient documentation

## 2014-03-17 DIAGNOSIS — G4733 Obstructive sleep apnea (adult) (pediatric): Secondary | ICD-10-CM | POA: Insufficient documentation

## 2014-03-17 DIAGNOSIS — Z7982 Long term (current) use of aspirin: Secondary | ICD-10-CM | POA: Insufficient documentation

## 2014-03-17 DIAGNOSIS — G629 Polyneuropathy, unspecified: Secondary | ICD-10-CM | POA: Diagnosis not present

## 2014-03-17 DIAGNOSIS — Z794 Long term (current) use of insulin: Secondary | ICD-10-CM | POA: Diagnosis not present

## 2014-03-17 DIAGNOSIS — N183 Chronic kidney disease, stage 3 unspecified: Secondary | ICD-10-CM

## 2014-03-17 DIAGNOSIS — I5032 Chronic diastolic (congestive) heart failure: Secondary | ICD-10-CM

## 2014-03-17 DIAGNOSIS — E785 Hyperlipidemia, unspecified: Secondary | ICD-10-CM | POA: Insufficient documentation

## 2014-03-17 DIAGNOSIS — Z79899 Other long term (current) drug therapy: Secondary | ICD-10-CM | POA: Diagnosis not present

## 2014-03-17 DIAGNOSIS — G2581 Restless legs syndrome: Secondary | ICD-10-CM | POA: Insufficient documentation

## 2014-03-17 DIAGNOSIS — E118 Type 2 diabetes mellitus with unspecified complications: Secondary | ICD-10-CM | POA: Insufficient documentation

## 2014-03-17 DIAGNOSIS — I129 Hypertensive chronic kidney disease with stage 1 through stage 4 chronic kidney disease, or unspecified chronic kidney disease: Secondary | ICD-10-CM | POA: Diagnosis not present

## 2014-03-17 DIAGNOSIS — N189 Chronic kidney disease, unspecified: Secondary | ICD-10-CM | POA: Insufficient documentation

## 2014-03-17 LAB — BASIC METABOLIC PANEL
Anion gap: 10 (ref 5–15)
BUN: 26 mg/dL — ABNORMAL HIGH (ref 6–23)
CO2: 30 mmol/L (ref 19–32)
Calcium: 9.1 mg/dL (ref 8.4–10.5)
Chloride: 101 mmol/L (ref 96–112)
Creatinine, Ser: 0.99 mg/dL (ref 0.50–1.10)
GFR calc Af Amer: 66 mL/min — ABNORMAL LOW (ref >90)
GFR calc non Af Amer: 57 mL/min — ABNORMAL LOW (ref >90)
Glucose, Bld: 186 mg/dL — ABNORMAL HIGH (ref 70–99)
Potassium: 4.1 mmol/L (ref 3.5–5.1)
Sodium: 141 mmol/L (ref 135–145)

## 2014-03-17 MED ORDER — GABAPENTIN 300 MG PO CAPS: 300.0000 mg | ORAL_CAPSULE | Freq: Four times a day (QID) | ORAL | Status: DC

## 2014-03-17 NOTE — Patient Instructions (Signed)
Doing great!  Follow up 6 weeks.  Do the following things EVERYDAY: 1) Weigh yourself in the morning before breakfast. Write it down and keep it in a log. 2) Take your medicines as prescribed 3) Eat low salt foods-Limit salt (sodium) to 2000 mg per day.  4) Stay as active as you can everyday 5) Limit all fluids for the day to less than 2 liters

## 2014-03-17 NOTE — Progress Notes (Signed)
Patient ID: Melissa Myers, female   DOB: [birth year], 69 y.o.   MRN: [id number] PCP: Dr. Asa Lente Endocrinologist: Dr Loanne Drilling  69 yo with history of HTN, DM, hyperlipidemia, OHS/OSA, and chronic dyspnea/diastolic CHF.  Patient had an echo in 04/2009 showing moderate LVH and preserved LV systolic function. However, there was a very large LV mid-cavity gradient with valsalva.  Patient developed quite significant exertional dyspnea to the point where she was short of breath walking around her house. She had a pulmonary evaluation with Dr. Elsworth Soho but no primary lung problems were identified. Patient had an ETT-myoview in 05/2009 which was negative for ischemia or infarction. Right heart cath in 05/2009 showed mildly elevated right heart filling pressures but normal PA pressure and normal PCWP. She was started her on a beta blocker (Coreg) to try to lower her LV mid-cavity gradient (that likely occurs with exertion) and to better control BP.  V/Q scan was negative for PE.  PFTs from 06/2011 showed a restrictive defect. Last echo in 06/2013 showed EF 60-65% with normal RV size and systolic function.      Admitted 12/05/13 with volume overload. Diuresed with IV lasix and transitioned to torsemide 80 mg twice a day + metolazone. Overall she diuresed 21 pounds. Discharge weight was 263 pounds.   Follow up for Heart Failure: Symptomatically stable.  Able to walk around house without significant dyspnea.  Does not do much walking outside of house at this time due to left knee pain.  This has gradually worsened over the last year.  She is going to have an injection soon.  No chest pain, no tachypalpitations, no lightheadedness.  Weight is up about 7 lbs today.    Labs (10/13): K 4.1, creatinine 0.85 Labs (4/14): K 3.7, creatinine 1.2, BUN 43, BNP 23 Labs (5/14): K 4.1, creatinine 1.0 Labs (7/14): K 4.6 Creatinine 1.0 BNP 39.0  Labs (8/14): K 3.5, creatinine 0.91, BNP 71 Labs (11/14): K 3.7, creatinine 0.9 Labs  (12/14): K 4.1, creatinine 0.8, BNP 52 Labs (3/15): K 4, creatinine 0.9 Labs (4/15): K 3.7, creatinine 0.9 Labs (5/15): K 3.8, creatinine 0.9, BNP 29 Labs (6/15): K 3.4, BUN 105, creatinine 0.9 =>1.7 Labs (6/17/5) K 4.2, BUN 41, Creatinine 0.80 Labs (08/19/13) K 3.2, BUN 71, creatinine 1.5 Labs 09/05/13 K 4.1 Creatinine 1.1 Labs 11/07/13 K 3.6 Creatinine 0.91 Labs 12/11/13 K 4.5 Creatine 1.05 Labs (12/15): K 3.8, creatinine 1.03  Allergies (verified):  1) ! Sulfa  2) Morphine   Past Medical History:  1. Diabetes mellitus, type II  2. Hyperlipidemia: She has been unable to tolerate statins (has been on Vytorin, lovastatin, and Crestor) due to what sounds like rhabdo: developed muscle weakness and was told she had "muscle damage" with each of these statins. She has been told not to take statins anymore.  3. Hypertension 4. Obesity  5. Restless Leg Syndrome  6. OA  7. peripheral neuropathy  8. OHS/OSA: Uses supplemental oxygen, does not tolerate CPAP.   9. Chronic nausea/diarrhea: ? IBS  10. Diastolic CHF: Echo (4/09) with normal LV size, moderate LVH, EF 65-70%, LV mid-cavity gradient reaching 63 mmHg with valsalva but minimal at rest, grade I diastolic dysfunction, cannot estimate PA systolic pressure (no TR doppler signal), RV normal. RHC (4/11): Mean RA 11, RV 37/13, PA 33/15, mean PCWP 12, CI 3.3. Echo in 11/12 showed mild LVH, EF 65-70%, no LV mid-cavity gradient was mentioned.  Echo in 4/14 showed EF 65-70%, mild LVH, grade I diastolic dysfunction, PA  systolic pressure 35 mmHg, mild LV mid-cavity gradient.  Echo (5/15) with EF 60-65%, mild TR, normal RV size and systolic function.  11. ETT-myoview (4/11): 5'30", stopped due to fatigue, normal EF, no evidence for scar or ischemia. 12. V/Q scan 11/12: negative for PE.  Lower extremity venous doppler US (3/14): negative for DVT.  13. PFTs (5/13): FVC 50%, FEV1 62%, ratio 106%, DLCO 69%, TLC 69%.   Family History:  Family History of  Alcoholism/Addiction (parent)  Family History Diabetes 1st degree relative (grandparent)  Family History High cholesterol (parent, grandparent)  Family History Hypertension (parent, grandparent)  Family History Lung cancer (grandparent)  Stomach cancer (grandmother)  Celiac Sprue daughter  Mother with MI at 63, father with MI at 45, uncle with MI at 87, brother with stents in his 60s, multiple aunts with MIs and CVAs   Social History:  Never Smoked  no alcohol  married, lives with spouse and her mother  retired Network engineer - now housewife  Alcohol Use - no  Illicit Drug Use - no  Review of Systems  All systems reviewed and negative except as per HPI.   ROS: All systems reviewed and negative except as per HPI.   Current Outpatient Prescriptions  Medication Sig Dispense Refill  . aspirin EC 81 MG tablet Take 81 mg by mouth daily.    . carvedilol (COREG) 25 MG tablet TAKE 1 TABLET BY MOUTH TWICE DAILY WITH A MEAL 60 tablet 0  . cholecalciferol (VITAMIN D) 1000 UNITS tablet Take 1,000 Units by mouth daily.    . Coenzyme Q10 (COQ10) 30 MG CAPS Take 30 mg by mouth daily.     . DULoxetine (CYMBALTA) 60 MG capsule Take 1 capsule (60 mg total) by mouth daily. 90 capsule 3  . fenofibrate 160 MG tablet Take 160 mg by mouth daily.    . hydrALAZINE (APRESOLINE) 25 MG tablet Take 25 mg by mouth 3 (three) times daily.    . insulin glargine (LANTUS) 100 UNIT/ML injection Inject 50 Units into the skin at bedtime.     . insulin lispro (HUMALOG) 100 UNIT/ML injection 3 times a day (just before each meal) 50-60-70 units    . Magnesium 200 MG TABS Take 200 mg by mouth daily.  60 each   . metolazone (ZAROXOLYN) 2.5 MG tablet Take 1 tablet (2.5 mg total) by mouth every Monday, Wednesday, and Friday. 20 tablet 3  . omega-3 acid ethyl esters (LOVAZA) 1 G capsule Take 2 g by mouth 2 (two) times daily.    . potassium chloride SA (K-DUR,KLOR-CON) 20 MEQ tablet Take 2 tablets (40 meq) 3 times daily on metolazone  days; on days no metolazone 2 tablets (40 meq) twice a day. 145 tablet 3  . rOPINIRole (REQUIP) 2 MG tablet Take 2 mg by mouth 2 (two) times daily.    [name] spironolactone (ALDACTONE) 25 MG tablet Take 1 tablet (25 mg total) by mouth daily. 30 tablet 6  . torsemide (DEMADEX) 20 MG tablet TAKE 4 TABLETS BY MOUTH TWICE DAILY. 240 tablet 0   No current facility-administered medications for this encounter.   Filed Vitals:   03/17/14 0920  BP: 138/58  Pulse: 75  Weight: 275 lb 6.4 oz (124.921 kg)  SpO2: 94%    General: NAD, obese Husband present Neck: Thick, JVP difficult to assess d/t body habitus but does not appear elevated. No thyromegaly or thyroid nodule.  Lungs: Clear to auscultation bilaterally with normal respiratory effort. Chronic 2L oxygen CV: Nondisplaced PMI.  Heart regular S1/S2, no S3/S4, 2/6 SEM RUSB.  1+ woody edema with chronic erythema.  No carotid bruit.  Abdomen: Soft, nontender, no hepatosplenomegaly, Obese Neurologic: Alert and oriented x 3.  Psych: Normal affect. Extremities: No clubbing/cyanosis, bilateral venous stasis RLE/LLE trace edema.     Assessment/Plan:  1. Chronic diastolic CHF:  Stable NYHA III symptoms.  Weight is up some but volume status actually looks pretty good on exam.  - Will continue torsemide 80 mg BID and metolazone 2.5 mg QOD. Continue KCL 40 meq BID and days of metolazone take 40 meq TID. Check BMET today.  - SBP stable.  - I think we could consider her for CardioMEMS.  - Reinforced the need and importance of daily weights, a low sodium diet, and fluid restriction (less than 2 L a day). Instructed to call the HF clinic if weight increases more than 3 lbs overnight or 5 lbs in a week.  2. Suspect OHS/OSA:  Followed by Dr Elsworth Soho. Per Dr Elsworth Soho no role for CPAP yet. Continue oxygen continuous. 3. Obesity: Difficult for her to exercise with knee pain.  Have encouraged her to be more active and watch her portion control.  4. CKD: Creatinine baseline  1.3-1.6. Check BMET today.  5. HTN: SBP stable. Continue to follow.   F/U 6 weeks.   Loralie Champagne 03/17/2014

## 2014-03-18 ENCOUNTER — Other Ambulatory Visit: Payer: Self-pay

## 2014-03-18 MED ORDER — INSULIN GLARGINE 100 UNIT/ML ~~LOC~~ SOLN: 50.0000 [IU] | Freq: Every day | SUBCUTANEOUS | Status: DC

## 2014-03-18 MED ORDER — INSULIN LISPRO 100 UNIT/ML ~~LOC~~ SOLN: SUBCUTANEOUS | Status: DC

## 2014-03-19 ENCOUNTER — Other Ambulatory Visit: Payer: Self-pay

## 2014-03-19 DIAGNOSIS — Z79899 Other long term (current) drug therapy: Secondary | ICD-10-CM | POA: Diagnosis not present

## 2014-03-19 DIAGNOSIS — M1990 Unspecified osteoarthritis, unspecified site: Secondary | ICD-10-CM | POA: Diagnosis not present

## 2014-03-19 DIAGNOSIS — M25569 Pain in unspecified knee: Secondary | ICD-10-CM | POA: Diagnosis not present

## 2014-03-19 DIAGNOSIS — G894 Chronic pain syndrome: Secondary | ICD-10-CM | POA: Diagnosis not present

## 2014-03-19 DIAGNOSIS — G629 Polyneuropathy, unspecified: Secondary | ICD-10-CM | POA: Diagnosis not present

## 2014-03-24 ENCOUNTER — Ambulatory Visit: Payer: Medicare Other | Admitting: Podiatry

## 2014-03-27 DIAGNOSIS — Z794 Long term (current) use of insulin: Secondary | ICD-10-CM | POA: Diagnosis not present

## 2014-03-27 DIAGNOSIS — E669 Obesity, unspecified: Secondary | ICD-10-CM | POA: Diagnosis not present

## 2014-03-27 DIAGNOSIS — Z9981 Dependence on supplemental oxygen: Secondary | ICD-10-CM | POA: Diagnosis not present

## 2014-03-27 DIAGNOSIS — E119 Type 2 diabetes mellitus without complications: Secondary | ICD-10-CM | POA: Diagnosis not present

## 2014-03-27 DIAGNOSIS — I5033 Acute on chronic diastolic (congestive) heart failure: Secondary | ICD-10-CM | POA: Diagnosis not present

## 2014-03-27 DIAGNOSIS — G4733 Obstructive sleep apnea (adult) (pediatric): Secondary | ICD-10-CM | POA: Diagnosis not present

## 2014-04-02 ENCOUNTER — Other Ambulatory Visit (HOSPITAL_COMMUNITY): Payer: Self-pay | Admitting: *Deleted

## 2014-04-02 MED ORDER — POTASSIUM CHLORIDE CRYS ER 20 MEQ PO TBCR: 40.0000 meq | EXTENDED_RELEASE_TABLET | Freq: Two times a day (BID) | ORAL | Status: DC

## 2014-04-02 NOTE — Telephone Encounter (Signed)
Received letter from medicare that pt's potassium cl er will no longer be covered, pt must try and fail klor-con first, new rx for klor-con sent to pharmacy

## 2014-04-08 ENCOUNTER — Telehealth (HOSPITAL_COMMUNITY): Payer: Self-pay | Admitting: Vascular Surgery

## 2014-04-08 ENCOUNTER — Ambulatory Visit: Payer: Medicare Other | Admitting: Endocrinology

## 2014-04-08 ENCOUNTER — Other Ambulatory Visit (HOSPITAL_COMMUNITY): Payer: Self-pay | Admitting: Cardiology

## 2014-04-08 MED ORDER — TORSEMIDE 20 MG PO TABS: ORAL_TABLET | ORAL | Status: DC

## 2014-04-08 NOTE — Telephone Encounter (Signed)
Refill returned to pharmacy No refill request from pharmacy electronically or via fax  torsemide was not increased at last office visit, refilled med at most recent dose of 80 mg twice a day

## 2014-04-08 NOTE — Telephone Encounter (Signed)
Pt called she has been without fluid pills for 3 days pt is swelling, her feet hurt, and [facility] [place] [facility] called about his pt also, they need a new script for Torsemide 20 mg with increased dosage.. Please advise

## 2014-04-09 ENCOUNTER — Telehealth (HOSPITAL_COMMUNITY): Payer: Self-pay | Admitting: Vascular Surgery

## 2014-04-09 DIAGNOSIS — Z794 Long term (current) use of insulin: Secondary | ICD-10-CM | POA: Diagnosis not present

## 2014-04-09 DIAGNOSIS — G4733 Obstructive sleep apnea (adult) (pediatric): Secondary | ICD-10-CM | POA: Diagnosis not present

## 2014-04-09 DIAGNOSIS — I5033 Acute on chronic diastolic (congestive) heart failure: Secondary | ICD-10-CM | POA: Diagnosis not present

## 2014-04-09 DIAGNOSIS — Z9981 Dependence on supplemental oxygen: Secondary | ICD-10-CM | POA: Diagnosis not present

## 2014-04-09 DIAGNOSIS — E669 Obesity, unspecified: Secondary | ICD-10-CM | POA: Diagnosis not present

## 2014-04-09 DIAGNOSIS — E119 Type 2 diabetes mellitus without complications: Secondary | ICD-10-CM | POA: Diagnosis not present

## 2014-04-09 NOTE — Telephone Encounter (Signed)
[facility] nurse left message pt has accumulated a lot of fluid, lower extrem edema, pt has not had a lot of output 100 cc yesterday, pt was with out fluid pills for 3 days but got them today.. Please advise

## 2014-04-11 DIAGNOSIS — Z794 Long term (current) use of insulin: Secondary | ICD-10-CM | POA: Diagnosis not present

## 2014-04-11 DIAGNOSIS — G4733 Obstructive sleep apnea (adult) (pediatric): Secondary | ICD-10-CM | POA: Diagnosis not present

## 2014-04-11 DIAGNOSIS — Z9981 Dependence on supplemental oxygen: Secondary | ICD-10-CM | POA: Diagnosis not present

## 2014-04-11 DIAGNOSIS — E119 Type 2 diabetes mellitus without complications: Secondary | ICD-10-CM | POA: Diagnosis not present

## 2014-04-11 DIAGNOSIS — E669 Obesity, unspecified: Secondary | ICD-10-CM | POA: Diagnosis not present

## 2014-04-11 DIAGNOSIS — I5033 Acute on chronic diastolic (congestive) heart failure: Secondary | ICD-10-CM | POA: Diagnosis not present

## 2014-04-15 DIAGNOSIS — Z9981 Dependence on supplemental oxygen: Secondary | ICD-10-CM | POA: Diagnosis not present

## 2014-04-15 DIAGNOSIS — G4733 Obstructive sleep apnea (adult) (pediatric): Secondary | ICD-10-CM | POA: Diagnosis not present

## 2014-04-15 DIAGNOSIS — E119 Type 2 diabetes mellitus without complications: Secondary | ICD-10-CM | POA: Diagnosis not present

## 2014-04-15 DIAGNOSIS — Z794 Long term (current) use of insulin: Secondary | ICD-10-CM | POA: Diagnosis not present

## 2014-04-15 DIAGNOSIS — I5033 Acute on chronic diastolic (congestive) heart failure: Secondary | ICD-10-CM | POA: Diagnosis not present

## 2014-04-15 DIAGNOSIS — E669 Obesity, unspecified: Secondary | ICD-10-CM | POA: Diagnosis not present

## 2014-04-16 ENCOUNTER — Encounter: Payer: Self-pay | Admitting: Endocrinology

## 2014-04-16 ENCOUNTER — Ambulatory Visit (INDEPENDENT_AMBULATORY_CARE_PROVIDER_SITE_OTHER): Payer: Medicare Other | Admitting: Endocrinology

## 2014-04-16 VITALS — BP 141/49 | HR 77 | Temp 98.5°F | Ht 59.0 in | Wt 268.6 lb

## 2014-04-16 DIAGNOSIS — G2581 Restless legs syndrome: Secondary | ICD-10-CM | POA: Diagnosis not present

## 2014-04-16 DIAGNOSIS — Z79899 Other long term (current) drug therapy: Secondary | ICD-10-CM | POA: Diagnosis not present

## 2014-04-16 DIAGNOSIS — G894 Chronic pain syndrome: Secondary | ICD-10-CM | POA: Diagnosis not present

## 2014-04-16 DIAGNOSIS — E1029 Type 1 diabetes mellitus with other diabetic kidney complication: Secondary | ICD-10-CM

## 2014-04-16 DIAGNOSIS — M25569 Pain in unspecified knee: Secondary | ICD-10-CM | POA: Diagnosis not present

## 2014-04-16 DIAGNOSIS — IMO0002 Reserved for concepts with insufficient information to code with codable children: Secondary | ICD-10-CM

## 2014-04-16 DIAGNOSIS — E1065 Type 1 diabetes mellitus with hyperglycemia: Principal | ICD-10-CM

## 2014-04-16 DIAGNOSIS — M545 Low back pain: Secondary | ICD-10-CM | POA: Diagnosis not present

## 2014-04-16 LAB — HEMOGLOBIN A1C: Hgb A1c MFr Bld: 8.5 % — ABNORMAL HIGH (ref 4.6–6.5)

## 2014-04-16 NOTE — Patient Instructions (Addendum)
check your blood sugar twice a day.  vary the time of day when you check, between before the 3 meals, and at bedtime.  also check if you have symptoms of your blood sugar being too high or too low.  please keep a record of the readings and bring it to your next appointment here.  You can write it on any piece of paper.  please call us sooner if your blood sugar goes below 70, or if you have a lot of readings over 200.  good diet and exercise habits significanly improve the control of your diabetes.  please let me know if you wish to be referred to a dietician.   Please come back for a follow-up appointment in 3 months.   blood tests are being requested for you today.  We'll let you know about the results.

## 2014-04-16 NOTE — Progress Notes (Signed)
Subjective:    Patient ID: Melissa Myers, female    DOB: 03/03/1945, 69 y.o.   MRN: [id number]  HPI Pt returns for f/u of diabetes mellitus: DM type: Insulin-requiring type 2.   Dx'ed: 8828 Complications: sensory neuropathy of the lower extremities, renal insufficiency, and leg ulcers Therapy: insulin since 2011 GDM: never DKA: never Severe hypoglycemia: never Pancreatitis: never Other: takes multiple daily injections; she is too ill to undergo weight loss surgery.   Interval history: no cbg record, but states cbg's vary from 150-200's, but most are in the high-100's.  There is no trend throughout the day.  Pt says increased cbg's are due to poor diet.   Past Medical History  Diagnosis Date  . Type II or unspecified type diabetes mellitus without mention of complication, not stated as uncontrolled     insulin dep  . Hyperlipidemia     hx rhabdo on statins  . Hypertension   . Diastolic CHF   . Chronic diarrhea     a/w nausea - felt related to IBS  . RLS (restless legs syndrome)   . Osteoarthritis   . Stasis dermatitis   . GERD (gastroesophageal reflux disease)   . On home oxygen therapy     uses oxygen 2 liters min per [place] at night and prn during day  . Anemia   . Neuropathy     feet, toes and fingers  . Disc degeneration, lumbar   . OSA (obstructive sleep apnea)     05/2009 sleep study - refuses CPAP  . Deaf     left side only  . Shortness of breath     chronic    Past Surgical History  Procedure Laterality Date  . Tubal ligation  1980  . Cholecystectomy  1997  . Uterine polyp removal  2008  . Umbilical hernia repair  1995  . Tonsillectomy  1970  . Colonoscopy N/A 12/03/2012    Procedure: COLONOSCOPY;  Surgeon: Lafayette Dragon, MD;  Location: [facility];  Service: Endoscopy;  Laterality: N/A;    History   Social History  . Marital Status: Married    Spouse Name: N/A  . Number of Children: N/A  . Years of Education: N/A   Occupational History  .  Not on file.   Social History Main Topics  . Smoking status: Never Smoker   . Smokeless tobacco: Never Used  . Alcohol Use: No  . Drug Use: No  . Sexual Activity: No   Other Topics Concern  . Not on file   Social History Narrative   Lives with spouse and mother. Retired Network engineer, now housewife    Current Outpatient Prescriptions on File Prior to Visit  Medication Sig Dispense Refill  . aspirin EC 81 MG tablet Take 81 mg by mouth daily.    . carvedilol (COREG) 25 MG tablet TAKE 1 TABLET BY MOUTH TWICE DAILY WITH A MEAL 60 tablet 0  . cholecalciferol (VITAMIN D) 1000 UNITS tablet Take 1,000 Units by mouth daily.    . Coenzyme Q10 (COQ10) 30 MG CAPS Take 30 mg by mouth daily.     . DULoxetine (CYMBALTA) 60 MG capsule Take 1 capsule (60 mg total) by mouth daily. 90 capsule 3  . fenofibrate 160 MG tablet Take 160 mg by mouth daily.    [name] gabapentin (NEURONTIN) 300 MG capsule Take 1 capsule (300 mg total) by mouth 4 (four) times daily. 120 capsule 3  . hydrALAZINE (APRESOLINE) 25 MG tablet Take 25  mg by mouth 3 (three) times daily.    . insulin glargine (LANTUS) 100 UNIT/ML injection Inject 0.5 mLs (50 Units total) into the skin at bedtime. 20 mL 1  . Magnesium 200 MG TABS Take 200 mg by mouth daily.  60 each   . metolazone (ZAROXOLYN) 2.5 MG tablet Take 1 tablet (2.5 mg total) by mouth every Monday, Wednesday, and Friday. 20 tablet 3  . omega-3 acid ethyl esters (LOVAZA) 1 G capsule Take 2 g by mouth 2 (two) times daily.    . potassium chloride SA (K-DUR,KLOR-CON) 20 MEQ tablet Take 2 tablets (40 mEq total) by mouth 2 (two) times daily. Take an extra 2 tabs when you take metolazone 90 tablet 3  . rOPINIRole (REQUIP) 2 MG tablet Take 2 mg by mouth 2 (two) times daily.    [name] spironolactone (ALDACTONE) 25 MG tablet Take 1 tablet (25 mg total) by mouth daily. 30 tablet 6  . torsemide (DEMADEX) 20 MG tablet TAKE 4 TABLETS BY MOUTH TWICE DAILY. 240 tablet 2   No current facility-administered  medications on file prior to visit.    Allergies  Allergen Reactions  . Statins Other (See Comments)    Statin drugs cause muscle pain / "muscle damage"--was told by MD not to take  . Sulfa Antibiotics Diarrhea  . Levemir [Insulin Detemir] Itching  . Morphine Other (See Comments)    GI upset and headaches  . Zinc Swelling and Rash    Family History  Problem Relation Age of Onset  . Heart disease Mother   . Heart disease Father   . Heart disease      family history  . Stomach cancer Paternal Grandmother   . Lung cancer Paternal Grandfather   . Colon cancer Neg Hx   . CVA      several aunts  . Heart attack Mother 77  . Heart attack Father 50  . Heart attack      several aunts and an uncle    BP 141/49 mmHg  Pulse 77  Temp(Src) 98.5 F (36.9 C) (Oral)  Ht 4\' 11"  (1.499 m)  Wt 268 lb 9.6 oz (121.836 kg)  BMI 54.22 kg/m2  Review of Systems She denies hypoglycemia.  She has gained weight.    Objective:   Physical Exam VITAL SIGNS:  See vs page.  GENERAL: no distress.  Pulses: dorsalis pedis intact bilat.   Feet: no deformity. 1+ bilat leg edema.  Skin: no ulcer on the feet. normal color and temp.  Neuro: sensation is intact to touch on the feet, but decreased from normal.    Lab Results  Component Value Date   HGBA1C 8.5* 04/16/2014      Assessment & Plan:  DM: moderate exacerbation Obesity: worse.   Patient is advised the following: Patient Instructions  check your blood sugar twice a day.  vary the time of day when you check, between before the 3 meals, and at bedtime.  also check if you have symptoms of your blood sugar being too high or too low.  please keep a record of the readings and bring it to your next appointment here.  You can write it on any piece of paper.  please call us sooner if your blood sugar goes below 70, or if you have a lot of readings over 200.  good diet and exercise habits significanly improve the control of your diabetes.   please let me know if you wish to be referred to a dietician.  Please come back for a follow-up appointment in 3 months.   blood tests are being requested for you today.  We'll let you know about the results.   addendum: increase the humalog to 3 times a day (just before each meal) 60-70-80 units.

## 2014-04-17 MED ORDER — INSULIN LISPRO 100 UNIT/ML ~~LOC~~ SOLN: SUBCUTANEOUS | Status: DC

## 2014-04-21 ENCOUNTER — Other Ambulatory Visit (HOSPITAL_COMMUNITY): Payer: Self-pay | Admitting: Adult Health

## 2014-04-24 DIAGNOSIS — Z794 Long term (current) use of insulin: Secondary | ICD-10-CM | POA: Diagnosis not present

## 2014-04-24 DIAGNOSIS — G4733 Obstructive sleep apnea (adult) (pediatric): Secondary | ICD-10-CM | POA: Diagnosis not present

## 2014-04-24 DIAGNOSIS — E119 Type 2 diabetes mellitus without complications: Secondary | ICD-10-CM | POA: Diagnosis not present

## 2014-04-24 DIAGNOSIS — Z9981 Dependence on supplemental oxygen: Secondary | ICD-10-CM | POA: Diagnosis not present

## 2014-04-24 DIAGNOSIS — E669 Obesity, unspecified: Secondary | ICD-10-CM | POA: Diagnosis not present

## 2014-04-24 DIAGNOSIS — I5033 Acute on chronic diastolic (congestive) heart failure: Secondary | ICD-10-CM | POA: Diagnosis not present

## 2014-04-24 NOTE — Telephone Encounter (Signed)
Note below was attached in error.

## 2014-04-24 NOTE — Telephone Encounter (Signed)
Pt wants size of needle increased to 100 please call pt back

## 2014-04-25 ENCOUNTER — Telehealth: Payer: Self-pay

## 2014-04-25 NOTE — Telephone Encounter (Signed)
I am not the right person to authorize brace devices She may see Dr Tamala Julian or her ortho for this authorization - but i will not sign as I do not have appropriate or extensive documentation to support need for rx on same Thanks!

## 2014-04-25 NOTE — Telephone Encounter (Signed)
[facility] called and wanted to know the status of the knee, back and ankle brace that was faxed. The order is not approved without a referral and/or fitting for dme.   They stated that they spoke to the pt on 04/09/2014 - pt called [facility].   Spoke to pt and she does want the braces. I informed that these mail order types are difficult to get the right size. [facility] will evaluate for need and necessity.

## 2014-04-28 ENCOUNTER — Encounter (HOSPITAL_COMMUNITY): Payer: Self-pay

## 2014-04-28 ENCOUNTER — Ambulatory Visit (HOSPITAL_COMMUNITY)
Admission: RE | Admit: 2014-04-28 | Discharge: 2014-04-28 | Disposition: A | Discharge status: Home / Self Care | Payer: Medicare Other | Source: Ambulatory Visit | Attending: Cardiology | Admitting: Cardiology

## 2014-04-28 VITALS — BP 124/68 | HR 71 | Wt 282.5 lb

## 2014-04-28 DIAGNOSIS — Z9114 Patient's other noncompliance with medication regimen: Secondary | ICD-10-CM | POA: Diagnosis not present

## 2014-04-28 DIAGNOSIS — N189 Chronic kidney disease, unspecified: Secondary | ICD-10-CM | POA: Diagnosis not present

## 2014-04-28 DIAGNOSIS — E785 Hyperlipidemia, unspecified: Secondary | ICD-10-CM | POA: Diagnosis not present

## 2014-04-28 DIAGNOSIS — G629 Polyneuropathy, unspecified: Secondary | ICD-10-CM | POA: Diagnosis not present

## 2014-04-28 DIAGNOSIS — E119 Type 2 diabetes mellitus without complications: Secondary | ICD-10-CM | POA: Diagnosis not present

## 2014-04-28 DIAGNOSIS — E669 Obesity, unspecified: Secondary | ICD-10-CM | POA: Insufficient documentation

## 2014-04-28 DIAGNOSIS — Z7982 Long term (current) use of aspirin: Secondary | ICD-10-CM | POA: Diagnosis not present

## 2014-04-28 DIAGNOSIS — G2581 Restless legs syndrome: Secondary | ICD-10-CM | POA: Insufficient documentation

## 2014-04-28 DIAGNOSIS — I5032 Chronic diastolic (congestive) heart failure: Secondary | ICD-10-CM | POA: Insufficient documentation

## 2014-04-28 DIAGNOSIS — Z79899 Other long term (current) drug therapy: Secondary | ICD-10-CM | POA: Diagnosis not present

## 2014-04-28 DIAGNOSIS — I129 Hypertensive chronic kidney disease with stage 1 through stage 4 chronic kidney disease, or unspecified chronic kidney disease: Secondary | ICD-10-CM | POA: Diagnosis not present

## 2014-04-28 DIAGNOSIS — Z794 Long term (current) use of insulin: Secondary | ICD-10-CM | POA: Insufficient documentation

## 2014-04-28 NOTE — Progress Notes (Signed)
Patient ID: Melissa Myers, female   DOB: [birth year], 69 y.o.   MRN: [id number] PCP: Dr. Asa Lente Endocrinologist: Dr Loanne Drilling  69 yo with history of HTN, DM, hyperlipidemia, OHS/OSA, and chronic dyspnea/diastolic CHF.  Patient had an echo in 04/2009 showing moderate LVH and preserved LV systolic function. However, there was a very large LV mid-cavity gradient with valsalva.  Patient developed quite significant exertional dyspnea to the point where she was short of breath walking around her house. She had a pulmonary evaluation with Dr. Elsworth Soho but no primary lung problems were identified. Patient had an ETT-myoview in 05/2009 which was negative for ischemia or infarction. Right heart cath in 05/2009 showed mildly elevated right heart filling pressures but normal PA pressure and normal PCWP. She was started her on a beta blocker (Coreg) to try to lower her LV mid-cavity gradient (that likely occurs with exertion) and to better control BP.  V/Q scan was negative for PE.  PFTs from 06/2011 showed a restrictive defect. Last echo in 06/2013 showed EF 60-65% with normal RV size and systolic function.      Admitted 12/05/13 with volume overload. Diuresed with IV lasix and transitioned to torsemide 80 mg twice a day + metolazone. Overall she diuresed 21 pounds. Discharge weight was 263 pounds.   Follow up for Heart Failure: Overall feels ok. Denies PND/Orthopnea. Does admit to dyspnea with exertion. She stopped weighing at home. Taking all medications except for metolazone. Eating high fat foods such as doughnuts. Drinking> 2 liters per day and eats ice through out the day.   Labs (10/13): K 4.1, creatinine 0.85 Labs (4/14): K 3.7, creatinine 1.2, BUN 43, BNP 23 Labs (5/14): K 4.1, creatinine 1.0 Labs (7/14): K 4.6 Creatinine 1.0 BNP 39.0  Labs (8/14): K 3.5, creatinine 0.91, BNP 71 Labs (11/14): K 3.7, creatinine 0.9 Labs (12/14): K 4.1, creatinine 0.8, BNP 52 Labs (3/15): K 4, creatinine 0.9 Labs (4/15): K  3.7, creatinine 0.9 Labs (5/15): K 3.8, creatinine 0.9, BNP 29 Labs (6/15): K 3.4, BUN 105, creatinine 0.9 =>1.7 Labs (6/17/5) K 4.2, BUN 41, Creatinine 0.80 Labs (08/19/13) K 3.2, BUN 71, creatinine 1.5 Labs 09/05/13 K 4.1 Creatinine 1.1 Labs 11/07/13 K 3.6 Creatinine 0.91 Labs 12/11/13 K 4.5 Creatine 1.05 Labs (12/15): K 3.8, creatinine 1.03  Allergies (verified):  1) ! Sulfa  2) Morphine   Past Medical History:  1. Diabetes mellitus, type II  2. Hyperlipidemia: She has been unable to tolerate statins (has been on Vytorin, lovastatin, and Crestor) due to what sounds like rhabdo: developed muscle weakness and was told she had "muscle damage" with each of these statins. She has been told not to take statins anymore.  3. Hypertension 4. Obesity  5. Restless Leg Syndrome  6. OA  7. peripheral neuropathy  8. OHS/OSA: Uses supplemental oxygen, does not tolerate CPAP.   9. Chronic nausea/diarrhea: ? IBS  10. Diastolic CHF: Echo (5/83) with normal LV size, moderate LVH, EF 65-70%, LV mid-cavity gradient reaching 63 mmHg with valsalva but minimal at rest, grade I diastolic dysfunction, cannot estimate PA systolic pressure (no TR doppler signal), RV normal. RHC (4/11): Mean RA 11, RV 37/13, PA 33/15, mean PCWP 12, CI 3.3. Echo in 11/12 showed mild LVH, EF 65-70%, no LV mid-cavity gradient was mentioned.  Echo in 4/14 showed EF 65-70%, mild LVH, grade I diastolic dysfunction, PA systolic pressure 35 mmHg, mild LV mid-cavity gradient.  Echo (5/15) with EF 60-65%, mild TR, normal RV size and systolic function.  11. ETT-myoview (4/11): 5'30", stopped due to fatigue, normal EF, no evidence for scar or ischemia. 12. V/Q scan 11/12: negative for PE.  Lower extremity venous doppler US (3/14): negative for DVT.  13. PFTs (5/13): FVC 50%, FEV1 62%, ratio 106%, DLCO 69%, TLC 69%.   Family History:  Family History of Alcoholism/Addiction (parent)  Family History Diabetes 1st degree relative (grandparent)   Family History High cholesterol (parent, grandparent)  Family History Hypertension (parent, grandparent)  Family History Lung cancer (grandparent)  Stomach cancer (grandmother)  Celiac Sprue daughter  Mother with MI at 23, father with MI at 33, uncle with MI at 1, brother with stents in his 38s, multiple aunts with MIs and CVAs   Social History:  Never Smoked  no alcohol  married, lives with spouse and her mother  retired Network engineer - now housewife  Alcohol Use - no  Illicit Drug Use - no  Review of Systems  All systems reviewed and negative except as per HPI.   ROS: All systems reviewed and negative except as per HPI.   Current Outpatient Prescriptions  Medication Sig Dispense Refill  . aspirin EC 81 MG tablet Take 81 mg by mouth daily.    . carvedilol (COREG) 25 MG tablet TAKE 1 TABLET BY MOUTH TWICE DAILY WITH FOOD 60 tablet 6  . cholecalciferol (VITAMIN D) 1000 UNITS tablet Take 1,000 Units by mouth daily.    . Coenzyme Q10 (COQ10) 30 MG CAPS Take 30 mg by mouth daily.     . DULoxetine (CYMBALTA) 60 MG capsule Take 1 capsule (60 mg total) by mouth daily. 90 capsule 3  . fenofibrate 160 MG tablet Take 160 mg by mouth daily.    [name] gabapentin (NEURONTIN) 300 MG capsule Take 1 capsule (300 mg total) by mouth 4 (four) times daily. 120 capsule 3  . hydrALAZINE (APRESOLINE) 25 MG tablet Take 25 mg by mouth 3 (three) times daily.     . insulin glargine (LANTUS) 100 UNIT/ML injection Inject 0.5 mLs (50 Units total) into the skin at bedtime. 20 mL 1  . insulin lispro (HUMALOG) 100 UNIT/ML injection 3 times a day (just before each meal) 60-70-80 units 70 mL 11  . Magnesium 200 MG TABS Take 200 mg by mouth daily.  60 each   . omega-3 acid ethyl esters (LOVAZA) 1 G capsule Take 2 g by mouth 2 (two) times daily.    . potassium chloride SA (K-DUR,KLOR-CON) 20 MEQ tablet Take 2 tablets (40 mEq total) by mouth 2 (two) times daily. Take an extra 2 tabs when you take metolazone 90 tablet 3  .  rOPINIRole (REQUIP) 2 MG tablet Take 2 mg by mouth 2 (two) times daily.    [name] spironolactone (ALDACTONE) 25 MG tablet Take 1 tablet (25 mg total) by mouth daily. 30 tablet 6  . torsemide (DEMADEX) 20 MG tablet TAKE 4 TABLETS BY MOUTH TWICE DAILY. 240 tablet 2  . metolazone (ZAROXOLYN) 2.5 MG tablet Take 1 tablet (2.5 mg total) by mouth every Monday, Wednesday, and Friday. (Patient not taking: Reported on 04/28/2014) 20 tablet 3   No current facility-administered medications for this encounter.   Filed Vitals:   04/28/14 1403  BP: 124/68  Pulse: 71  Weight: 282 lb 8 oz (128.141 kg)  SpO2: 94%    General: NAD, obese Husband present Neck: Thick, JVP difficult to assess d/t body habitus but appears mildly elevated.  No thyromegaly or thyroid nodule.  Lungs: Clear to auscultation bilaterally with normal respiratory  effort. Chronic 2L oxygen CV: Nondisplaced PMI.  Heart regular S1/S2, no S3/S4, 2/6 SEM RUSB.  1+ woody edema with chronic erythema.  No carotid bruit.  Abdomen: Soft, nontender, no hepatosplenomegaly, Obese Neurologic: Alert and oriented x 3.  Psych: Normal affect. Extremities: No clubbing/cyanosis, bilateral venous stasis RLE/LLE  Assessment/Plan:  1. Chronic diastolic CHF:  Stable NYHA III symptoms. Volume status elevated likely due to fluid noncompliance as well as not taking metolazone.  Consider cardiomems. Last hospitalized October 2015. Chest circumference 120 cm.   - Will continue torsemide 80 mg BID and restart metolazone 2.5 mg QOD. Continue KCL 40 meq BID and days of metolazone take 40 meq TID.   Today we discussed possible cardiomems with [facility] for placement.  She understands she needs aspirin/plavix for 1 month followed by chronic aspirin. She will consider .  - Reinforced the need and importance of daily weights, a low sodium diet, and fluid restriction (less than 2 L a day). Instructed to call the HF clinic if weight increases more than 3 lbs overnight or 5 lbs in a  week.  2. Suspect OHS/OSA:  Followed by Dr Elsworth Soho. Per Dr Elsworth Soho no role for CPAP yet. Continue oxygen continuous. 3. Obesity: Difficult for her to exercise with knee pain.  Have encouraged her to be more active and watch her portion control.  4. CKD: Creatinine baseline 1.3-1.6. 5. HTN: SBP stable. Continue to follow.   Follow up in 4 weeks.   CLEGG,AMY 04/28/2014  Patient seen and examined with Darrick Grinder, NP. We discussed all aspects of the encounter. I agree with the assessment and plan as stated above.   She remains volume overloaded in the setting of noncompliance with fluid restriction and skipping several doses of metolazone. Agree with med changes as above. Will restart metolazone. Stressed need for compliance. We discussed possibility of Cardiomems placement to help manage her HF more closely through home monitoring. She is interested and we will plan to schedule in April.   Daniel Bensimhon,MD 4:09 PM

## 2014-04-28 NOTE — Patient Instructions (Signed)
Please let us know if you have any more questions about Cardiomems device and if you would like to pursue this.  Follow up 4 weeks.  Do the following things EVERYDAY: 1) Weigh yourself in the morning before breakfast. Write it down and keep it in a log. 2) Take your medicines as prescribed 3) Eat low salt foods-Limit salt (sodium) to 2000 mg per day.  4) Stay as active as you can everyday 5) Limit all fluids for the day to less than 2 liters

## 2014-05-07 DIAGNOSIS — I5033 Acute on chronic diastolic (congestive) heart failure: Secondary | ICD-10-CM | POA: Diagnosis not present

## 2014-05-07 DIAGNOSIS — Z9981 Dependence on supplemental oxygen: Secondary | ICD-10-CM | POA: Diagnosis not present

## 2014-05-07 DIAGNOSIS — E669 Obesity, unspecified: Secondary | ICD-10-CM | POA: Diagnosis not present

## 2014-05-07 DIAGNOSIS — Z794 Long term (current) use of insulin: Secondary | ICD-10-CM | POA: Diagnosis not present

## 2014-05-07 DIAGNOSIS — E119 Type 2 diabetes mellitus without complications: Secondary | ICD-10-CM | POA: Diagnosis not present

## 2014-05-07 DIAGNOSIS — G4733 Obstructive sleep apnea (adult) (pediatric): Secondary | ICD-10-CM | POA: Diagnosis not present

## 2014-05-14 DIAGNOSIS — Z79899 Other long term (current) drug therapy: Secondary | ICD-10-CM | POA: Diagnosis not present

## 2014-05-14 DIAGNOSIS — M545 Low back pain: Secondary | ICD-10-CM | POA: Diagnosis not present

## 2014-05-14 DIAGNOSIS — G894 Chronic pain syndrome: Secondary | ICD-10-CM | POA: Diagnosis not present

## 2014-05-14 DIAGNOSIS — G2581 Restless legs syndrome: Secondary | ICD-10-CM | POA: Diagnosis not present

## 2014-05-14 DIAGNOSIS — M25569 Pain in unspecified knee: Secondary | ICD-10-CM | POA: Diagnosis not present

## 2014-05-23 DIAGNOSIS — E119 Type 2 diabetes mellitus without complications: Secondary | ICD-10-CM | POA: Diagnosis not present

## 2014-05-23 DIAGNOSIS — Z794 Long term (current) use of insulin: Secondary | ICD-10-CM | POA: Diagnosis not present

## 2014-05-23 DIAGNOSIS — E669 Obesity, unspecified: Secondary | ICD-10-CM | POA: Diagnosis not present

## 2014-05-23 DIAGNOSIS — Z9981 Dependence on supplemental oxygen: Secondary | ICD-10-CM | POA: Diagnosis not present

## 2014-05-23 DIAGNOSIS — I5033 Acute on chronic diastolic (congestive) heart failure: Secondary | ICD-10-CM | POA: Diagnosis not present

## 2014-05-23 DIAGNOSIS — G4733 Obstructive sleep apnea (adult) (pediatric): Secondary | ICD-10-CM | POA: Diagnosis not present

## 2014-05-26 ENCOUNTER — Encounter (HOSPITAL_COMMUNITY): Payer: Medicare Other

## 2014-05-30 ENCOUNTER — Other Ambulatory Visit: Payer: Self-pay | Admitting: Endocrinology

## 2014-05-30 NOTE — Telephone Encounter (Signed)
Dan the pharmist stated that patient need new prescription for syringes 1 ml

## 2014-06-02 MED ORDER — INSULIN SYRINGE-NEEDLE U-100 27G X 1/2" 1 ML MISC
Status: DC
Start: 2014-06-02 — End: 2014-12-30

## 2014-06-02 NOTE — Telephone Encounter (Signed)
Sent to [facility].

## 2014-06-05 DIAGNOSIS — G4733 Obstructive sleep apnea (adult) (pediatric): Secondary | ICD-10-CM | POA: Diagnosis not present

## 2014-06-05 DIAGNOSIS — Z9981 Dependence on supplemental oxygen: Secondary | ICD-10-CM | POA: Diagnosis not present

## 2014-06-05 DIAGNOSIS — I5033 Acute on chronic diastolic (congestive) heart failure: Secondary | ICD-10-CM | POA: Diagnosis not present

## 2014-06-05 DIAGNOSIS — E119 Type 2 diabetes mellitus without complications: Secondary | ICD-10-CM | POA: Diagnosis not present

## 2014-06-05 DIAGNOSIS — Z794 Long term (current) use of insulin: Secondary | ICD-10-CM | POA: Diagnosis not present

## 2014-06-05 DIAGNOSIS — E669 Obesity, unspecified: Secondary | ICD-10-CM | POA: Diagnosis not present

## 2014-06-09 ENCOUNTER — Encounter (HOSPITAL_COMMUNITY): Payer: Self-pay

## 2014-06-09 ENCOUNTER — Ambulatory Visit (HOSPITAL_COMMUNITY)
Admission: RE | Admit: 2014-06-09 | Discharge: 2014-06-09 | Disposition: A | Discharge status: Home / Self Care | Payer: Medicare Other | Source: Ambulatory Visit | Attending: Cardiology | Admitting: Cardiology

## 2014-06-09 VITALS — BP 128/70 | HR 73 | Wt 277.2 lb

## 2014-06-09 DIAGNOSIS — Z79899 Other long term (current) drug therapy: Secondary | ICD-10-CM | POA: Insufficient documentation

## 2014-06-09 DIAGNOSIS — N183 Chronic kidney disease, stage 3 unspecified: Secondary | ICD-10-CM

## 2014-06-09 DIAGNOSIS — E669 Obesity, unspecified: Secondary | ICD-10-CM | POA: Insufficient documentation

## 2014-06-09 DIAGNOSIS — E785 Hyperlipidemia, unspecified: Secondary | ICD-10-CM | POA: Diagnosis not present

## 2014-06-09 DIAGNOSIS — E119 Type 2 diabetes mellitus without complications: Secondary | ICD-10-CM | POA: Insufficient documentation

## 2014-06-09 DIAGNOSIS — I10 Essential (primary) hypertension: Secondary | ICD-10-CM | POA: Diagnosis not present

## 2014-06-09 DIAGNOSIS — Z7982 Long term (current) use of aspirin: Secondary | ICD-10-CM | POA: Diagnosis not present

## 2014-06-09 DIAGNOSIS — I5032 Chronic diastolic (congestive) heart failure: Secondary | ICD-10-CM

## 2014-06-09 DIAGNOSIS — Z794 Long term (current) use of insulin: Secondary | ICD-10-CM | POA: Insufficient documentation

## 2014-06-09 LAB — BASIC METABOLIC PANEL
Anion gap: 15 (ref 5–15)
BUN: 41 mg/dL — ABNORMAL HIGH (ref 6–23)
CO2: 30 mmol/L (ref 19–32)
CREATININE: 1.24 mg/dL — AB (ref 0.50–1.10)
Calcium: 8.9 mg/dL (ref 8.4–10.5)
Chloride: 91 mmol/L — ABNORMAL LOW (ref 96–112)
GFR calc Af Amer: 51 mL/min — ABNORMAL LOW (ref >90)
GFR calc non Af Amer: 44 mL/min — ABNORMAL LOW (ref >90)
Glucose, Bld: 214 mg/dL — ABNORMAL HIGH (ref 70–99)
POTASSIUM: 3.7 mmol/L (ref 3.5–5.1)
SODIUM: 136 mmol/L (ref 135–145)

## 2014-06-09 NOTE — Patient Instructions (Signed)
Lab today  We will contact you in 2 months to schedule your next appointment.

## 2014-06-09 NOTE — Progress Notes (Signed)
Patient ID: Melissa Myers, female   DOB: Jan 17, 1946, 69 y.o.   MRN: [id number] PCP: Dr. Asa Lente Endocrinologist: Dr Loanne Drilling  69 yo with history of HTN, DM, hyperlipidemia, OHS/OSA, and chronic dyspnea/diastolic CHF.  Patient had an echo in 04/2009 showing moderate LVH and preserved LV systolic function. However, there was a very large LV mid-cavity gradient with valsalva.  Patient developed quite significant exertional dyspnea to the point where she was short of breath walking around her house. She had a pulmonary evaluation with Dr. Elsworth Soho but no primary lung problems were identified. Patient had an ETT-myoview in 05/2009 which was negative for ischemia or infarction. Right heart cath in 05/2009 showed mildly elevated right heart filling pressures but normal PA pressure and normal PCWP. She was started her on a beta blocker (Coreg) to try to lower her LV mid-cavity gradient (that likely occurs with exertion) and to better control BP.  V/Q scan was negative for PE.  PFTs from 06/2011 showed a restrictive defect. Last echo in 06/2013 showed EF 60-65% with normal RV size and systolic function.      Admitted 12/05/13 with volume overload. Diuresed with IV lasix and transitioned to torsemide 80 mg twice a day + metolazone. Overall she diuresed 21 pounds. Discharge weight was 263 pounds.   Follow up for Heart Failure: Overall feels ok. Denies PND/Orthopnea. Limited mobility. Uses wheelchair in the house. Had considered cardiomem but refused. Weight at home 266-273 pounds. Taking all medications.   Drinking> 2 liters per day. Still eating ice.    Labs (10/13): K 4.1, creatinine 0.85 Labs (4/14): K 3.7, creatinine 1.2, BUN 43, BNP 23 Labs (5/14): K 4.1, creatinine 1.0 Labs (7/14): K 4.6 Creatinine 1.0 BNP 39.0  Labs (8/14): K 3.5, creatinine 0.91, BNP 71 Labs (11/14): K 3.7, creatinine 0.9 Labs (12/14): K 4.1, creatinine 0.8, BNP 52 Labs (3/15): K 4, creatinine 0.9 Labs (4/15): K 3.7, creatinine 0.9 Labs  (5/15): K 3.8, creatinine 0.9, BNP 29 Labs (6/15): K 3.4, BUN 105, creatinine 0.9 =>1.7 Labs (6/17/5) K 4.2, BUN 41, Creatinine 0.80 Labs (08/19/13) K 3.2, BUN 71, creatinine 1.5 Labs 09/05/13 K 4.1 Creatinine 1.1 Labs 11/07/13 K 3.6 Creatinine 0.91 Labs 12/11/13 K 4.5 Creatine 1.05 Labs (12/15): K 3.8, creatinine 1.03 Labs (03/17/2014): K 4.1 Creatinine 0.99   Allergies (verified):  1) ! Sulfa  2) Morphine   Past Medical History:  1. Diabetes mellitus, type II  2. Hyperlipidemia: She has been unable to tolerate statins (has been on Vytorin, lovastatin, and Crestor) due to what sounds like rhabdo: developed muscle weakness and was told she had "muscle damage" with each of these statins. She has been told not to take statins anymore.  3. Hypertension 4. Obesity  5. Restless Leg Syndrome  6. OA  7. peripheral neuropathy  8. OHS/OSA: Uses supplemental oxygen, does not tolerate CPAP.   9. Chronic nausea/diarrhea: ? IBS  10. Diastolic CHF: Echo (5/00) with normal LV size, moderate LVH, EF 65-70%, LV mid-cavity gradient reaching 63 mmHg with valsalva but minimal at rest, grade I diastolic dysfunction, cannot estimate PA systolic pressure (no TR doppler signal), RV normal. RHC (4/11): Mean RA 11, RV 37/13, PA 33/15, mean PCWP 12, CI 3.3. Echo in 11/12 showed mild LVH, EF 65-70%, no LV mid-cavity gradient was mentioned.  Echo in 4/14 showed EF 65-70%, mild LVH, grade I diastolic dysfunction, PA systolic pressure 35 mmHg, mild LV mid-cavity gradient.  Echo (5/15) with EF 60-65%, mild TR, normal RV size and  systolic function.  11. ETT-myoview (4/11): 5'30", stopped due to fatigue, normal EF, no evidence for scar or ischemia. 12. V/Q scan 11/12: negative for PE.  Lower extremity venous doppler US (3/14): negative for DVT.  13. PFTs (5/13): FVC 50%, FEV1 62%, ratio 106%, DLCO 69%, TLC 69%.   Family History:  Family History of Alcoholism/Addiction (parent)  Family History Diabetes 1st degree relative  (grandparent)  Family History High cholesterol (parent, grandparent)  Family History Hypertension (parent, grandparent)  Family History Lung cancer (grandparent)  Stomach cancer (grandmother)  Celiac Sprue daughter  Mother with MI at 69, father with MI at 69, uncle with MI at 69, brother with stents in his 69s multiple aunts with MIs and CVAs   Social History:  Never Smoked  no alcohol  married, lives with spouse and her mother  retired Network engineer - now housewife  Alcohol Use - no  Illicit Drug Use - no  Review of Systems  All systems reviewed and negative except as per HPI.   ROS: All systems reviewed and negative except as per HPI.   Current Outpatient Prescriptions  Medication Sig Dispense Refill  . aspirin EC 81 MG tablet Take 81 mg by mouth daily.    . carvedilol (COREG) 25 MG tablet TAKE 1 TABLET BY MOUTH TWICE DAILY WITH FOOD 60 tablet 6  . cholecalciferol (VITAMIN D) 1000 UNITS tablet Take 1,000 Units by mouth daily.    . Coenzyme Q10 (COQ10) 30 MG CAPS Take 30 mg by mouth daily.     . DULoxetine (CYMBALTA) 60 MG capsule Take 1 capsule (60 mg total) by mouth daily. 90 capsule 3  . fenofibrate 160 MG tablet Take 160 mg by mouth daily.    [name] gabapentin (NEURONTIN) 300 MG capsule Take 1 capsule (300 mg total) by mouth 4 (four) times daily. 120 capsule 3  . hydrALAZINE (APRESOLINE) 25 MG tablet Take 25 mg by mouth 3 (three) times daily.     . insulin glargine (LANTUS) 100 UNIT/ML injection Inject 0.5 mLs (50 Units total) into the skin at bedtime. 20 mL 1  . insulin lispro (HUMALOG) 100 UNIT/ML injection 3 times a day (just before each meal) 60-70-80 units 70 mL 11  . Insulin Syringe-Needle U-100 (B-D INSULIN SYRINGE 1CC/27G) 27G X 1/2" 1 ML MISC Use to inject insulin 4 times daily as advised. 120 each 5  . Magnesium 200 MG TABS Take 200 mg by mouth daily.  60 each   . metolazone (ZAROXOLYN) 2.5 MG tablet Take 1 tablet (2.5 mg total) by mouth every Monday, Wednesday, and Friday.  20 tablet 3  . omega-3 acid ethyl esters (LOVAZA) 1 G capsule Take 2 g by mouth 2 (two) times daily.    . potassium chloride SA (K-DUR,KLOR-CON) 20 MEQ tablet Take 2 tablets (40 mEq total) by mouth 2 (two) times daily. Take an extra 2 tabs when you take metolazone 90 tablet 3  . rOPINIRole (REQUIP) 2 MG tablet Take 2 mg by mouth 2 (two) times daily.    [name] spironolactone (ALDACTONE) 25 MG tablet Take 1 tablet (25 mg total) by mouth daily. 30 tablet 6  . torsemide (DEMADEX) 20 MG tablet TAKE 4 TABLETS BY MOUTH TWICE DAILY. 240 tablet 2   No current facility-administered medications for this encounter.   Filed Vitals:   06/09/14 1358  BP: 128/70  Pulse: 73  Weight: 277 lb 4 oz (125.76 kg)  SpO2: 94%    General: NAD, obeseNeck: Thick, JVP difficult to assess d/t  body habitus but appears mildly elevated.  No thyromegaly or thyroid nodule.  Lungs: Clear to auscultation bilaterally with normal respiratory effort. Chronic 2L oxygen CV: Nondisplaced PMI.  Heart regular S1/S2, no S3/S4, 2/6 SEM RUSB.    No carotid bruit.  Abdomen: Soft, nontender, no hepatosplenomegaly, Obese Neurologic: Alert and oriented x 3.  Psych: Normal affect. Extremities: No clubbing/cyanosis, bilateral venous stasis RLE/LLE trace edema.   Assessment/Plan:  1. Chronic diastolic CHF:  Stable NYHA III symptoms. Volume status ok today. elevated likely due to fluid noncompliance as well as not taking metolazone.  Consider cardiomems. Last hospitalized October 2015.  - Will continue torsemide 80 mg BID and  metolazone 2.5 mg QOD. Continue KCL 40 meq BID and days of metolazone take 40 meq TID.   Discussed cardiomems and she does not want to pursue cardiomems. - Reinforced the need and importance of daily weights, a low sodium diet, and fluid restriction (less than 2 L a day). Instructed to call the HF clinic if weight increases more than 3 lbs overnight or 5 lbs in a week.  I have asked her to cut back ice intake.  2. Suspect  OHS/OSA:  Followed by Dr Elsworth Soho. Per Dr Elsworth Soho no role for CPAP yet. Continue oxygen continuous. 3. Obesity: Difficult for her to exercise with knee pain.  Have encouraged her to be more active and watch her portion control.   4. CKD: Creatinine baseline 1.3-1.6. 5. HTN: SBP stable. Continue to follow.   Follow up in  2 months. Check BMET BNP today.    CLEGG,AMY NP-C    06/09/2014

## 2014-06-16 ENCOUNTER — Other Ambulatory Visit (HOSPITAL_COMMUNITY): Payer: Self-pay | Admitting: *Deleted

## 2014-06-16 ENCOUNTER — Telehealth: Payer: Self-pay | Admitting: Licensed Clinical Social Worker

## 2014-06-16 MED ORDER — HYDRALAZINE HCL 25 MG PO TABS: 25.0000 mg | ORAL_TABLET | Freq: Three times a day (TID) | ORAL | Status: DC

## 2014-06-16 NOTE — Telephone Encounter (Signed)
CSW attempted to contact patient via phone to offer supportive intervention although unable to leave voicemail due to mailbox not set up.CSW will attempt contact again. Melissa Myers, [place]

## 2014-06-26 ENCOUNTER — Other Ambulatory Visit: Payer: Self-pay | Admitting: Endocrinology

## 2014-06-26 ENCOUNTER — Telehealth: Payer: Self-pay | Admitting: Endocrinology

## 2014-06-26 NOTE — Telephone Encounter (Signed)
Pt called and says she needs a change  her insulin that she is currently taking that it is too expensive

## 2014-06-27 ENCOUNTER — Other Ambulatory Visit (HOSPITAL_COMMUNITY): Payer: Self-pay | Admitting: Anesthesiology

## 2014-06-27 ENCOUNTER — Other Ambulatory Visit: Payer: Self-pay | Admitting: Endocrinology

## 2014-06-27 NOTE — Telephone Encounter (Signed)
Unable to reach pt will try again at a later time.  

## 2014-06-27 NOTE — Telephone Encounter (Signed)
I tried to contact the patient. Voicemail has not been set up. Will try again at a later time.

## 2014-06-30 MED ORDER — INSULIN REGULAR HUMAN 100 UNIT/ML IJ SOLN: INTRAMUSCULAR | Status: DC

## 2014-06-30 MED ORDER — INSULIN NPH (HUMAN) (ISOPHANE) 100 UNIT/ML ~~LOC~~ SUSP: 40.0000 [IU] | Freq: Every day | SUBCUTANEOUS | Status: DC

## 2014-06-30 NOTE — Telephone Encounter (Signed)
i have sent a prescription to your pharmacy We have to reduce the bedtime insulin to 40 units, at least for now, as it is faster-acting than lantus i'll see you soon

## 2014-06-30 NOTE — Telephone Encounter (Signed)
Received a note from patients pharmacy. Humalog is too expensive and novolog is a cheaper alternative. Please advise if ok to change.  Thanks!

## 2014-06-30 NOTE — Addendum Note (Signed)
Addended by: Renato Shin on: 06/30/2014 12:39 PM   Modules accepted: Orders, Medications

## 2014-06-30 NOTE — Telephone Encounter (Signed)
Pt advised that Novolin N and R have been sent to her pharmacy. Patient voiced understanding.

## 2014-07-02 DIAGNOSIS — G2581 Restless legs syndrome: Secondary | ICD-10-CM | POA: Diagnosis not present

## 2014-07-02 DIAGNOSIS — M1710 Unilateral primary osteoarthritis, unspecified knee: Secondary | ICD-10-CM | POA: Diagnosis not present

## 2014-07-02 DIAGNOSIS — G5790 Unspecified mononeuropathy of unspecified lower limb: Secondary | ICD-10-CM | POA: Diagnosis not present

## 2014-07-02 DIAGNOSIS — M545 Low back pain: Secondary | ICD-10-CM | POA: Diagnosis not present

## 2014-07-02 DIAGNOSIS — G894 Chronic pain syndrome: Secondary | ICD-10-CM | POA: Diagnosis not present

## 2014-07-02 DIAGNOSIS — Z79899 Other long term (current) drug therapy: Secondary | ICD-10-CM | POA: Diagnosis not present

## 2014-07-07 ENCOUNTER — Other Ambulatory Visit (HOSPITAL_COMMUNITY): Payer: Self-pay | Admitting: Adult Health

## 2014-07-07 ENCOUNTER — Other Ambulatory Visit (HOSPITAL_COMMUNITY): Payer: Self-pay | Admitting: Internal Medicine

## 2014-07-08 ENCOUNTER — Encounter: Payer: Self-pay | Admitting: Internal Medicine

## 2014-07-08 ENCOUNTER — Other Ambulatory Visit (INDEPENDENT_AMBULATORY_CARE_PROVIDER_SITE_OTHER): Payer: Medicare Other

## 2014-07-08 ENCOUNTER — Ambulatory Visit (INDEPENDENT_AMBULATORY_CARE_PROVIDER_SITE_OTHER): Payer: Medicare Other | Admitting: Internal Medicine

## 2014-07-08 VITALS — BP 130/58 | HR 72 | Temp 97.8°F | Ht 59.0 in | Wt 274.2 lb

## 2014-07-08 DIAGNOSIS — E1065 Type 1 diabetes mellitus with hyperglycemia: Secondary | ICD-10-CM

## 2014-07-08 DIAGNOSIS — E785 Hyperlipidemia, unspecified: Secondary | ICD-10-CM | POA: Diagnosis not present

## 2014-07-08 DIAGNOSIS — E1029 Type 1 diabetes mellitus with other diabetic kidney complication: Secondary | ICD-10-CM

## 2014-07-08 DIAGNOSIS — IMO0002 Reserved for concepts with insufficient information to code with codable children: Secondary | ICD-10-CM

## 2014-07-08 DIAGNOSIS — R300 Dysuria: Secondary | ICD-10-CM

## 2014-07-08 LAB — LIPID PANEL
CHOL/HDL RATIO: 8
Cholesterol: 291 mg/dL — ABNORMAL HIGH (ref 0–200)
HDL: 37.5 mg/dL — ABNORMAL LOW (ref >39.00)
Triglycerides: 699 mg/dL — ABNORMAL HIGH (ref 0.0–149.0)

## 2014-07-08 LAB — URINALYSIS, ROUTINE W REFLEX MICROSCOPIC
Bilirubin Urine: NEGATIVE
HGB URINE DIPSTICK: NEGATIVE
Ketones, ur: NEGATIVE
NITRITE: POSITIVE — AB
RBC / HPF: NONE SEEN (ref 0–?)
Specific Gravity, Urine: 1.01 (ref 1.000–1.030)
Total Protein, Urine: NEGATIVE
Urine Glucose: NEGATIVE
Urobilinogen, UA: 0.2 (ref 0.0–1.0)
pH: 6.5 (ref 5.0–8.0)

## 2014-07-08 LAB — LDL CHOLESTEROL, DIRECT: Direct LDL: 108 mg/dL

## 2014-07-08 LAB — MICROALBUMIN / CREATININE URINE RATIO
CREATININE, U: 59.8 mg/dL
Microalb Creat Ratio: 1.2 mg/g (ref 0.0–30.0)
Microalb, Ur: <0.7 mg/dL (ref 0.0–1.9)

## 2014-07-08 LAB — HEMOGLOBIN A1C: HEMOGLOBIN A1C: 8.2 % — AB (ref 4.6–6.5)

## 2014-07-08 MED ORDER — GABAPENTIN 300 MG PO CAPS: 300.0000 mg | ORAL_CAPSULE | Freq: Four times a day (QID) | ORAL | Status: DC

## 2014-07-08 MED ORDER — INSULIN NPH (HUMAN) (ISOPHANE) 100 UNIT/ML ~~LOC~~ SUSP: 40.0000 [IU] | Freq: Every day | SUBCUTANEOUS | Status: DC

## 2014-07-08 MED ORDER — TORSEMIDE 20 MG PO TABS: 80.0000 mg | ORAL_TABLET | Freq: Two times a day (BID) | ORAL | Status: DC

## 2014-07-08 MED ORDER — FENOFIBRATE 160 MG PO TABS: 160.0000 mg | ORAL_TABLET | Freq: Every day | ORAL | Status: DC

## 2014-07-08 MED ORDER — EZETIMIBE 10 MG PO TABS: 10.0000 mg | ORAL_TABLET | Freq: Every day | ORAL | Status: DC

## 2014-07-08 MED ORDER — CARVEDILOL 25 MG PO TABS: 25.0000 mg | ORAL_TABLET | Freq: Two times a day (BID) | ORAL | Status: DC

## 2014-07-08 MED ORDER — METOLAZONE 2.5 MG PO TABS: 2.5000 mg | ORAL_TABLET | ORAL | Status: DC

## 2014-07-08 MED ORDER — SPIRONOLACTONE 25 MG PO TABS: 25.0000 mg | ORAL_TABLET | Freq: Every day | ORAL | Status: DC

## 2014-07-08 MED ORDER — CIPROFLOXACIN HCL 500 MG PO TABS: 500.0000 mg | ORAL_TABLET | Freq: Two times a day (BID) | ORAL | Status: AC

## 2014-07-08 MED ORDER — DULOXETINE HCL 60 MG PO CPEP: 60.0000 mg | ORAL_CAPSULE | Freq: Every day | ORAL | Status: DC

## 2014-07-08 MED ORDER — ASPIRIN EC 81 MG PO TBEC: 81.0000 mg | DELAYED_RELEASE_TABLET | Freq: Every day | ORAL | Status: DC

## 2014-07-08 MED ORDER — NYSTATIN 100000 UNIT/GM EX POWD: 1.0000 g | Freq: Three times a day (TID) | CUTANEOUS | Status: DC

## 2014-07-08 NOTE — Assessment & Plan Note (Signed)
Intolerant of statin Will start Zetia - erx done Check lipids annually The patient is asked to make an attempt to improve diet and exercise patterns to aid in medical management of this problem.

## 2014-07-08 NOTE — Addendum Note (Signed)
Addended by: Gwendolyn Grant A on: 07/08/2014 06:25 PM   Modules accepted: Orders

## 2014-07-08 NOTE — Assessment & Plan Note (Signed)
Has followed with various endo docs: previously [facility], Et[facility] Sjogren and Buddy Duty, currently with [facility] Belling on TID aprida, changed to Nolvolin R TID with [facility] in 2016 because of formulary changes Reviewed importance of diet with aerobic exercise and intentional weight loss Follow-up with endocrinology for labs and medication management  Lab Results  Component Value Date   HGBA1C 8.5* 04/16/2014

## 2014-07-08 NOTE — Progress Notes (Signed)
Subjective:    Patient ID: Melissa Myers, female    DOB: 02/13/1946, 69 y.o.   MRN: [id number]  HPI  Patient here for follow up  Past Medical History  Diagnosis Date  . Type II or unspecified type diabetes mellitus without mention of complication, not stated as uncontrolled     insulin dep  . Hyperlipidemia     hx rhabdo on statins  . Hypertension   . Diastolic CHF   . Chronic diarrhea     a/w nausea - felt related to IBS  . RLS (restless legs syndrome)   . Osteoarthritis   . Stasis dermatitis   . GERD (gastroesophageal reflux disease)   . On home oxygen therapy     uses oxygen 2 liters min per [place] at night and prn during day  . Anemia   . Neuropathy     feet, toes and fingers  . Disc degeneration, lumbar   . OSA (obstructive sleep apnea)     05/2009 sleep study - refuses CPAP  . Deaf     left side only  . Shortness of breath     chronic    Review of Systems  Constitutional: Positive for fatigue. Negative for unexpected weight change.  Respiratory: Positive for shortness of breath (chronic). Negative for cough.   Cardiovascular: Negative for chest pain and leg swelling.  Skin: Positive for rash (under pannus and B breasts).       Objective:    Physical Exam  Constitutional: She appears well-developed and well-nourished. No distress.  MO  Cardiovascular: Normal rate, regular rhythm and normal heart sounds.   No murmur heard. Pulmonary/Chest: Effort normal and breath sounds normal. No respiratory distress.  Musculoskeletal: She exhibits no edema.  Skin:  Candidiasis changes under bilateral breasts and pannus folds    BP 130/58 mmHg  Pulse 72  Temp(Src) 97.8 F (36.6 C) (Oral)  Ht 4\' 11"  (1.499 m)  Wt 274 lb 4 oz (124.399 kg)  BMI 55.36 kg/m2  SpO2 93% Wt Readings from Last 3 Encounters:  07/08/14 274 lb 4 oz (124.399 kg)  04/16/14 268 lb 9.6 oz (121.836 kg)  03/17/14 275 lb 6.4 oz (124.921 kg)     Lab Results  Component Value Date   WBC  6.6 12/09/2013   HGB 11.2* 12/09/2013   HCT 35.5* 12/09/2013   PLT 270 12/09/2013   GLUCOSE 214* 06/09/2014   CHOL 366* 08/19/2013   TRIG 382.0* 08/19/2013   HDL 51.80 08/19/2013   LDLDIRECT 127.5 08/03/2012   LDLCALC 238* 08/19/2013   ALT 19 12/05/2013   AST 21 12/05/2013   NA 136 06/09/2014   K 3.7 06/09/2014   CL 91* 06/09/2014   CREATININE 1.24* 06/09/2014   BUN 41* 06/09/2014   CO2 30 06/09/2014   TSH 2.010 12/05/2013   INR 1.10 07/11/2012   HGBA1C 8.5* 04/16/2014   MICROALBUR 0.2 11/08/2013    No results found.     Assessment & Plan:   Candidiasis - nystatin powder prescribed. Etiology of same explained. Patient work on better diabetes control  Problem List Items Addressed This Visit    Dyslipidemia    Intolerant of statin Will start Zetia - erx done Check lipids annually The patient is asked to make an attempt to improve diet and exercise patterns to aid in medical management of this problem.       Relevant Medications   fenofibrate 160 MG tablet   ezetimibe (ZETIA) 10 MG tablet   Other  Relevant Orders   Lipid panel   Morbid obesity    Wt Readings from Last 3 Encounters:  07/08/14 274 lb 4 oz (124.399 kg)  04/16/14 268 lb 9.6 oz (121.836 kg)  03/17/14 275 lb 6.4 oz (124.921 kg)   The patient is asked to make an attempt to improve diet and exercise patterns to aid in medical management of this problem.       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 - Primary    Has followed with various endo docs: previously [place], Etter Sjogren and Buddy Duty, currently with Romero Belling on TID aprida, changed to Nolvolin R TID with NPH qhs in 2016 because of formulary changes Reviewed importance of diet with aerobic exercise and intentional weight loss Follow-up with endocrinology for labs and medication management  Lab Results  Component Value Date   HGBA1C 8.5*  04/16/2014        Relevant Medications   insulin NPH Human (NOVOLIN N) 100 UNIT/ML injection   aspirin EC 81 MG tablet   Other Relevant Orders   Hemoglobin A1c   Microalbumin / creatinine urine ratio    Other Visit Diagnoses    Dysuria        Relevant Orders    Urinalysis, Routine w reflex microscopic        Gwendolyn Grant, MD

## 2014-07-08 NOTE — Patient Instructions (Addendum)
It was good to see you today.  We have reviewed your prior records including labs and tests today  Test(s) ordered today. Your results will be released to [facility] (or called to you) after review, usually within 72hours after test completion. If any changes need to be made, you will be notified at that same time.  Medications reviewed and updated Use nystatin powder for rash as discussed - Start Zetia once daily for cholesterol  no other changes recommended at this time. Refill for 90 day supplies on medication(s) as discussed today.  Continue working with your other specialists as ongoing  Work on lifestyle changes as discussed (low fat, low carb, increased protein diet; improved exercise efforts; weight loss) to control sugar, blood pressure and cholesterol levels and/or reduce risk of developing other medical problems. Look into [email or web address] or other type of food journal to assist you in this process.  Please schedule followup in 6 months, call sooner if problems.

## 2014-07-08 NOTE — Assessment & Plan Note (Signed)
Wt Readings from Last 3 Encounters:  07/08/14 274 lb 4 oz (124.399 kg)  04/16/14 268 lb 9.6 oz (121.836 kg)  03/17/14 275 lb 6.4 oz (124.921 kg)   The patient is asked to make an attempt to improve diet and exercise patterns to aid in medical management of this problem.

## 2014-07-08 NOTE — Progress Notes (Signed)
Pre visit review using our clinic review tool, if applicable. No additional management support is needed unless otherwise documented below in the visit note. 

## 2014-07-15 ENCOUNTER — Ambulatory Visit: Payer: Medicare Other | Admitting: Endocrinology

## 2014-07-22 ENCOUNTER — Ambulatory Visit (INDEPENDENT_AMBULATORY_CARE_PROVIDER_SITE_OTHER): Payer: Medicare Other | Admitting: Endocrinology

## 2014-07-22 ENCOUNTER — Encounter: Payer: Self-pay | Admitting: Endocrinology

## 2014-07-22 VITALS — BP 134/60 | HR 78 | Ht 59.0 in | Wt 286.0 lb

## 2014-07-22 DIAGNOSIS — E1029 Type 1 diabetes mellitus with other diabetic kidney complication: Secondary | ICD-10-CM | POA: Diagnosis not present

## 2014-07-22 DIAGNOSIS — IMO0002 Reserved for concepts with insufficient information to code with codable children: Secondary | ICD-10-CM

## 2014-07-22 DIAGNOSIS — E1065 Type 1 diabetes mellitus with hyperglycemia: Principal | ICD-10-CM

## 2014-07-22 MED ORDER — INSULIN REGULAR HUMAN 100 UNIT/ML IJ SOLN: INTRAMUSCULAR | Status: DC

## 2014-07-22 MED ORDER — INSULIN NPH (HUMAN) (ISOPHANE) 100 UNIT/ML ~~LOC~~ SUSP: 40.0000 [IU] | Freq: Every day | SUBCUTANEOUS | Status: DC

## 2014-07-22 NOTE — Progress Notes (Signed)
Subjective:    Patient ID: Melissa Myers, female    DOB: [birth year], 69 y.o.   MRN: [id number]  HPI Pt returns for f/u of diabetes mellitus: DM type: Insulin-requiring type 2.   Dx'ed: 0254 Complications: sensory neuropathy of the lower extremities, renal insufficiency, and leg ulcers Therapy: insulin since 2011 GDM: never DKA: never Severe hypoglycemia: never Pancreatitis: never Other: takes multiple daily injections; she is too ill to undergo weight loss surgery.   Interval history: Hx is from pt and husband.  She did not increase the insulin at last ov as advised.  no cbg record, but states cbg's vary from 110-290.  It is in general higher as the day goes on.   Past Medical History  Diagnosis Date  . Type II or unspecified type diabetes mellitus without mention of complication, not stated as uncontrolled     insulin dep  . Hyperlipidemia     hx rhabdo on statins  . Hypertension   . Diastolic CHF   . Chronic diarrhea     a/w nausea - felt related to IBS  . RLS (restless legs syndrome)   . Osteoarthritis   . Stasis dermatitis   . GERD (gastroesophageal reflux disease)   . On home oxygen therapy     uses oxygen 2 liters min per [place] at night and prn during day  . Anemia   . Neuropathy     feet, toes and fingers  . Disc degeneration, lumbar   . OSA (obstructive sleep apnea)     05/2009 sleep study - refuses CPAP  . Deaf     left side only  . Shortness of breath     chronic    Past Surgical History  Procedure Laterality Date  . Tubal ligation  1980  . Cholecystectomy  1997  . Uterine polyp removal  2008  . Umbilical hernia repair  1995  . Tonsillectomy  1970  . Colonoscopy N/A 12/03/2012    Procedure: COLONOSCOPY;  Surgeon: Lafayette Dragon, MD;  Location: [facility];  Service: Endoscopy;  Laterality: N/A;    History   Social History  . Marital Status: Married    Spouse Name: N/A  . Number of Children: N/A  . Years of Education: N/A   Occupational  History  . Not on file.   Social History Main Topics  . Smoking status: Never Smoker   . Smokeless tobacco: Never Used  . Alcohol Use: No  . Drug Use: No  . Sexual Activity: No   Other Topics Concern  . Not on file   Social History Narrative   Lives with spouse and mother. Retired Network engineer, now housewife    Current Outpatient Prescriptions on File Prior to Visit  Medication Sig Dispense Refill  . aspirin EC 81 MG tablet Take 1 tablet (81 mg total) by mouth daily.    . carvedilol (COREG) 25 MG tablet Take 1 tablet (25 mg total) by mouth 2 (two) times daily with a meal. 180 tablet 3  . cholecalciferol (VITAMIN D) 1000 UNITS tablet Take 1,000 Units by mouth daily.    . Coenzyme Q10 (COQ10) 30 MG CAPS Take 30 mg by mouth daily.     . DULoxetine (CYMBALTA) 60 MG capsule Take 1 capsule (60 mg total) by mouth daily. 90 capsule 3  . ezetimibe (ZETIA) 10 MG tablet Take 1 tablet (10 mg total) by mouth daily. 90 tablet 3  . fenofibrate 160 MG tablet Take 1 tablet (160 mg total)  by mouth daily. 90 tablet 3  . gabapentin (NEURONTIN) 300 MG capsule Take 1 capsule (300 mg total) by mouth 4 (four) times daily. 120 capsule 3  . hydrALAZINE (APRESOLINE) 25 MG tablet Take 1 tablet (25 mg total) by mouth 3 (three) times daily. 90 tablet 3  . Insulin Syringe-Needle U-100 (B-D INSULIN SYRINGE 1CC/27G) 27G X 1/2" 1 ML MISC Use to inject insulin 4 times daily as advised. 120 each 5  . Magnesium 200 MG TABS Take 200 mg by mouth daily.  60 each   . metolazone (ZAROXOLYN) 2.5 MG tablet Take 1 tablet (2.5 mg total) by mouth every other day. 20 tablet 3  . nystatin (MYCOSTATIN/NYSTOP) 100000 UNIT/GM POWD Apply 1 g topically 3 (three) times daily. 60 g 1  . omega-3 acid ethyl esters (LOVAZA) 1 G capsule Take 2 g by mouth 2 (two) times daily.    . potassium chloride SA (K-DUR,KLOR-CON) 20 MEQ tablet Take 2 tablets (40 mEq total) by mouth 2 (two) times daily. Take an extra 2 tabs when you take metolazone 90 tablet  3  . rOPINIRole (REQUIP) 2 MG tablet Take 2 mg by mouth 2 (two) times daily.    [name] spironolactone (ALDACTONE) 25 MG tablet Take 1 tablet (25 mg total) by mouth daily. 90 tablet 3  . torsemide (DEMADEX) 20 MG tablet Take 4 tablets (80 mg total) by mouth 2 (two) times daily. 240 tablet 5   No current facility-administered medications on file prior to visit.    Allergies  Allergen Reactions  . Statins Other (See Comments)    Statin drugs cause muscle pain / "muscle damage"--was told by MD not to take  . Sulfa Antibiotics Diarrhea  . Levemir [Insulin Detemir] Itching  . Morphine Other (See Comments)    GI upset and headaches  . Zinc Swelling and Rash    Family History  Problem Relation Age of Onset  . Heart disease Mother   . Heart disease Father   . Heart disease      family history  . Stomach cancer Paternal Grandmother   . Lung cancer Paternal Grandfather   . Colon cancer Neg Hx   . CVA      several aunts  . Heart attack Mother 40  . Heart attack Father 62  . Heart attack      several aunts and an uncle    BP 134/60 mmHg  Pulse 78  Ht 4\' 11"  (1.499 m)  Wt 286 lb (129.729 kg)  BMI 57.73 kg/m2  SpO2 91%  Review of Systems She denies hypoglycemia.    Objective:   Physical Exam VITAL SIGNS:  See vs page GENERAL: no distress.  Morbid obesity.  In wheelchair Pulses: dorsalis pedis absent bilat (prob due to edema)   Feet: no deformity. 1+ bilat leg edema.  Skin: no ulcer on the feet. normal temp on the feet.  Spotty hyperpigmentation on the legs and feet. Neuro: sensation is intact to touch on the feet, but decreased from normal.    Lab Results  Component Value Date   HGBA1C 8.2* 07/08/2014       Assessment & Plan:  DM: she needs increased rx  Patient is advised the following: Patient Instructions  check your blood sugar twice a day.  vary the time of day when you check, between before the 3 meals, and at bedtime.  also check if you have symptoms of your  blood sugar being too high or too low.  please keep  a record of the readings and bring it to your next appointment here.  You can write it on any piece of paper.  please call us sooner if your blood sugar goes below 70, or if you have a lot of readings over 200.  Please come back for a follow-up appointment in 3 months.  Please increase the reg insulin to 3 times a day (just before each meal) 60-70-80 units.

## 2014-07-22 NOTE — Patient Instructions (Addendum)
check your blood sugar twice a day.  vary the time of day when you check, between before the 3 meals, and at bedtime.  also check if you have symptoms of your blood sugar being too high or too low.  please keep a record of the readings and bring it to your next appointment here.  You can write it on any piece of paper.  please call us sooner if your blood sugar goes below 70, or if you have a lot of readings over 200.  Please come back for a follow-up appointment in 3 months.  Please increase the reg insulin to 3 times a day (just before each meal) 60-70-80 units.

## 2014-07-24 ENCOUNTER — Ambulatory Visit (HOSPITAL_COMMUNITY)
Admission: RE | Admit: 2014-07-24 | Discharge: 2014-07-24 | Disposition: A | Discharge status: Home / Self Care | Payer: Medicare Other | Source: Ambulatory Visit | Attending: Cardiology | Admitting: Cardiology

## 2014-07-24 ENCOUNTER — Encounter (HOSPITAL_COMMUNITY): Payer: Self-pay

## 2014-07-24 ENCOUNTER — Telehealth (HOSPITAL_COMMUNITY): Payer: Self-pay | Admitting: *Deleted

## 2014-07-24 VITALS — BP 118/66 | HR 68 | Wt 288.8 lb

## 2014-07-24 DIAGNOSIS — F329 Major depressive disorder, single episode, unspecified: Secondary | ICD-10-CM | POA: Diagnosis not present

## 2014-07-24 DIAGNOSIS — E785 Hyperlipidemia, unspecified: Secondary | ICD-10-CM | POA: Diagnosis not present

## 2014-07-24 DIAGNOSIS — G629 Polyneuropathy, unspecified: Secondary | ICD-10-CM | POA: Insufficient documentation

## 2014-07-24 DIAGNOSIS — I129 Hypertensive chronic kidney disease with stage 1 through stage 4 chronic kidney disease, or unspecified chronic kidney disease: Secondary | ICD-10-CM | POA: Insufficient documentation

## 2014-07-24 DIAGNOSIS — F32A Depression, unspecified: Secondary | ICD-10-CM

## 2014-07-24 DIAGNOSIS — Z79899 Other long term (current) drug therapy: Secondary | ICD-10-CM | POA: Diagnosis not present

## 2014-07-24 DIAGNOSIS — M1990 Unspecified osteoarthritis, unspecified site: Secondary | ICD-10-CM | POA: Diagnosis not present

## 2014-07-24 DIAGNOSIS — E119 Type 2 diabetes mellitus without complications: Secondary | ICD-10-CM | POA: Diagnosis not present

## 2014-07-24 DIAGNOSIS — G2581 Restless legs syndrome: Secondary | ICD-10-CM | POA: Insufficient documentation

## 2014-07-24 DIAGNOSIS — N189 Chronic kidney disease, unspecified: Secondary | ICD-10-CM | POA: Diagnosis not present

## 2014-07-24 DIAGNOSIS — Z794 Long term (current) use of insulin: Secondary | ICD-10-CM | POA: Diagnosis not present

## 2014-07-24 DIAGNOSIS — Z7982 Long term (current) use of aspirin: Secondary | ICD-10-CM | POA: Insufficient documentation

## 2014-07-24 DIAGNOSIS — I5032 Chronic diastolic (congestive) heart failure: Secondary | ICD-10-CM

## 2014-07-24 DIAGNOSIS — E669 Obesity, unspecified: Secondary | ICD-10-CM | POA: Diagnosis not present

## 2014-07-24 LAB — CBC
HEMATOCRIT: 37.7 % (ref 36.0–46.0)
Hemoglobin: 11.9 g/dL — ABNORMAL LOW (ref 12.0–15.0)
MCH: 26.2 pg (ref 26.0–34.0)
MCHC: 31.6 g/dL (ref 30.0–36.0)
MCV: 83 fL (ref 78.0–100.0)
Platelets: 332 10*3/uL (ref 150–400)
RBC: 4.54 MIL/uL (ref 3.87–5.11)
RDW: 15.5 % (ref 11.5–15.5)
WBC: 8 10*3/uL (ref 4.0–10.5)

## 2014-07-24 LAB — BASIC METABOLIC PANEL
Anion gap: 14 (ref 5–15)
BUN: 33 mg/dL — AB (ref 6–20)
CALCIUM: 9.6 mg/dL (ref 8.9–10.3)
CO2: 31 mmol/L (ref 22–32)
Chloride: 95 mmol/L — ABNORMAL LOW (ref 101–111)
Creatinine, Ser: 1.17 mg/dL — ABNORMAL HIGH (ref 0.44–1.00)
GFR calc non Af Amer: 46 mL/min — ABNORMAL LOW (ref >60)
GFR, EST AFRICAN AMERICAN: 54 mL/min — AB (ref >60)
Glucose, Bld: 150 mg/dL — ABNORMAL HIGH (ref 65–99)
Potassium: 3.5 mmol/L (ref 3.5–5.1)
Sodium: 140 mmol/L (ref 135–145)

## 2014-07-24 LAB — LIPID PANEL
CHOL/HDL RATIO: 5.3 ratio
Cholesterol: 217 mg/dL — ABNORMAL HIGH (ref 0–200)
HDL: 41 mg/dL (ref >40)
LDL CALC: 113 mg/dL — AB (ref 0–99)
Triglycerides: 314 mg/dL — ABNORMAL HIGH (ref <150)
VLDL: 63 mg/dL — ABNORMAL HIGH (ref 0–40)

## 2014-07-24 MED ORDER — TORSEMIDE 100 MG PO TABS: 100.0000 mg | ORAL_TABLET | Freq: Two times a day (BID) | ORAL | Status: DC

## 2014-07-24 MED ORDER — METOLAZONE 2.5 MG PO TABS: 2.5000 mg | ORAL_TABLET | ORAL | Status: DC

## 2014-07-24 MED ORDER — DEXTROSE 5 % IV SOLN
120.0000 mg | Freq: Once | INTRAVENOUS | Status: DC
Start: 2014-07-24 — End: 2014-07-25
  Filled 2014-07-24: qty 12

## 2014-07-24 MED ORDER — POTASSIUM CHLORIDE CRYS ER 20 MEQ PO TBCR
40.0000 meq | EXTENDED_RELEASE_TABLET | Freq: Once | ORAL | Status: DC
  Filled 2014-07-24: qty 2

## 2014-07-24 NOTE — Telephone Encounter (Signed)
Pt called reporting her wt is up about 12 lb in the past 2 weeks, she states her legs and feet are very swollen and painful, she has increased SOB with exertion.  She states she has been taking Torsemide 80 bid and met mon, wed and fri and it doesn't seem to be helping at all, she states it is continuing to get worse and she feels bad, she doesn't feel that the meds are working anymore.  Pt added to sch today

## 2014-07-24 NOTE — Progress Notes (Signed)
Patient added on to HF clinic schedule for weight gain, edema, SOB.  Patient found to be 12-15 lbs over baseline weight/volume overloaded.  PIV 22g in RH started x 1 attempt, patient tolerated well.  120mg  IV lasix given over 1 hr, 40 meq PO potassium given as well.  Patient tolerated okay.    Total Urinary Output: 800cc bright yellow/green clear nonodorous urine  PIV removed and clean dry gauze dressing applied to site before patient discharged from appointment.  Melissa Myers

## 2014-07-24 NOTE — Progress Notes (Signed)
Patient ID: Melissa Myers, female   DOB: [birth year], 69 y.o.   MRN: [id number] PCP: Dr. Asa Lente Endocrinologist: Dr Loanne Drilling  69 yo with history of HTN, DM, hyperlipidemia, OHS/OSA, and chronic dyspnea/diastolic CHF.  Patient had an echo in 04/2009 showing moderate LVH and preserved LV systolic function. However, there was a very large LV mid-cavity gradient with valsalva.  Patient developed quite significant exertional dyspnea to the point where she was short of breath walking around her house. She had a pulmonary evaluation with Dr. Elsworth Soho but no primary lung problems were identified. Patient had an ETT-myoview in 05/2009 which was negative for ischemia or infarction. Right heart cath in 05/2009 showed mildly elevated right heart filling pressures but normal PA pressure and normal PCWP. She was started her on a beta blocker (Coreg) to try to lower her LV mid-cavity gradient (that likely occurs with exertion) and to better control BP.  V/Q scan was negative for PE.  PFTs from 06/2011 showed a restrictive defect. Last echo in 06/2013 showed EF 60-65% with normal RV size and systolic function.      Admitted 12/05/13 with volume overload. Diuresed with IV lasix and transitioned to torsemide 80 mg twice a day + metolazone. Overall she diuresed 21 pounds. Discharge weight was 263 pounds.   Patient returns for followup today.  She is gaining weight, up 11 lbs since last appointment.  She can walk around her house without dyspnea but is dyspneic when she walks much farther (short of breath coming up from the parking lot).  +Bendopnea.  She has been more short of breath for a month.  No syncope, lightheadedness, palpitations.    Labs (10/13): K 4.1, creatinine 0.85 Labs (4/14): K 3.7, creatinine 1.2, BUN 43, BNP 23 Labs (5/14): K 4.1, creatinine 1.0 Labs (7/14): K 4.6 Creatinine 1.0 BNP 39.0  Labs (8/14): K 3.5, creatinine 0.91, BNP 71 Labs (11/14): K 3.7, creatinine 0.9 Labs (12/14): K 4.1, creatinine 0.8,  BNP 52 Labs (3/15): K 4, creatinine 0.9 Labs (4/15): K 3.7, creatinine 0.9 Labs (5/15): K 3.8, creatinine 0.9, BNP 29 Labs (6/15): K 3.4, BUN 105, creatinine 0.9 =>1.7 Labs (6/17/5) K 4.2, BUN 41, Creatinine 0.80 Labs (08/19/13) K 3.2, BUN 71, creatinine 1.5 Labs 09/05/13 K 4.1 Creatinine 1.1 Labs 11/07/13 K 3.6 Creatinine 0.91 Labs 12/11/13 K 4.5 Creatine 1.05 Labs (12/15): K 3.8, creatinine 1.03 Labs (03/17/2014): K 4.1 Creatinine 0.99  Labs (4/16): K 3.7, creatinine 1.24 Labs (5/16): LDL 108, TGs 699  Allergies (verified):  1) ! Sulfa  2) Morphine   Past Medical History:  1. Diabetes mellitus, type II  2. Hyperlipidemia: She has been unable to tolerate statins (has been on Vytorin, lovastatin, and Crestor) due to what sounds like rhabdo: developed muscle weakness and was told she had "muscle damage" with each of these statins. She has been told not to take statins anymore.  Has hypertriglyceridemia.  3. Hypertension 4. Obesity  5. Restless Leg Syndrome  6. OA  7. peripheral neuropathy  8. OHS/OSA: Uses supplemental oxygen, does not tolerate CPAP.   9. Chronic nausea/diarrhea: ? IBS  10. Diastolic CHF: Echo (7/67) with normal LV size, moderate LVH, EF 65-70%, LV mid-cavity gradient reaching 63 mmHg with valsalva but minimal at rest, grade I diastolic dysfunction, cannot estimate PA systolic pressure (no TR doppler signal), RV normal. RHC (4/11): Mean RA 11, RV 37/13, PA 33/15, mean PCWP 12, CI 3.3. Echo in 11/12 showed mild LVH, EF 65-70%, no LV mid-cavity gradient was  mentioned.  Echo in 4/14 showed EF 65-70%, mild LVH, grade I diastolic dysfunction, PA systolic pressure 35 mmHg, mild LV mid-cavity gradient.  Echo (5/15) with EF 60-65%, mild TR, normal RV size and systolic function.  11. ETT-myoview (4/11): 5'30", stopped due to fatigue, normal EF, no evidence for scar or ischemia. 12. V/Q scan 11/12: negative for PE.  Lower extremity venous doppler US (3/14): negative for DVT.  13.  PFTs (5/13): FVC 50%, FEV1 62%, ratio 106%, DLCO 69%, TLC 69%.   Family History:  Family History of Alcoholism/Addiction (parent)  Family History Diabetes 1st degree relative (grandparent)  Family History High cholesterol (parent, grandparent)  Family History Hypertension (parent, grandparent)  Family History Lung cancer (grandparent)  Stomach cancer (grandmother)  Celiac Sprue daughter  Mother with MI at 6, father with MI at 73, uncle with MI at 44, brother with stents in his 40s, multiple aunts with MIs and CVAs   Social History:  Never Smoked  no alcohol  married, lives with spouse and her mother  retired Network engineer - now housewife  Alcohol Use - no  Illicit Drug Use - no  Review of Systems  All systems reviewed and negative except as per HPI.   ROS: All systems reviewed and negative except as per HPI.   Current Outpatient Prescriptions  Medication Sig Dispense Refill  . aspirin EC 81 MG tablet Take 1 tablet (81 mg total) by mouth daily.    . carvedilol (COREG) 25 MG tablet Take 1 tablet (25 mg total) by mouth 2 (two) times daily with a meal. 180 tablet 3  . cholecalciferol (VITAMIN D) 1000 UNITS tablet Take 1,000 Units by mouth daily.    . Coenzyme Q10 (COQ10) 30 MG CAPS Take 30 mg by mouth daily.     . DULoxetine (CYMBALTA) 60 MG capsule Take 1 capsule (60 mg total) by mouth daily. 90 capsule 3  . ezetimibe (ZETIA) 10 MG tablet Take 1 tablet (10 mg total) by mouth daily. 90 tablet 3  . fenofibrate 160 MG tablet Take 1 tablet (160 mg total) by mouth daily. 90 tablet 3  . gabapentin (NEURONTIN) 300 MG capsule Take 1 capsule (300 mg total) by mouth 4 (four) times daily. 120 capsule 3  . hydrALAZINE (APRESOLINE) 25 MG tablet Take 1 tablet (25 mg total) by mouth 3 (three) times daily. 90 tablet 3  . insulin NPH Human (NOVOLIN N) 100 UNIT/ML injection Inject 0.4 mLs (40 Units total) into the skin at bedtime. 20 mL 11  . insulin regular (HUMULIN R) 100 units/mL injection 3 times a  day (just before each meal) 60-70-80 units 70 mL 11  . Insulin Syringe-Needle U-100 (B-D INSULIN SYRINGE 1CC/27G) 27G X 1/2" 1 ML MISC Use to inject insulin 4 times daily as advised. 120 each 5  . Magnesium 200 MG TABS Take 200 mg by mouth daily.  60 each   . metolazone (ZAROXOLYN) 2.5 MG tablet Take 1 tablet (2.5 mg total) by mouth every other day. 20 tablet 3  . nystatin (MYCOSTATIN/NYSTOP) 100000 UNIT/GM POWD Apply 1 g topically 3 (three) times daily. 60 g 1  . omega-3 acid ethyl esters (LOVAZA) 1 G capsule Take 2 g by mouth 2 (two) times daily.    . potassium chloride SA (K-DUR,KLOR-CON) 20 MEQ tablet Take 2 tablets (40 mEq total) by mouth 2 (two) times daily. Take an extra 2 tabs when you take metolazone 90 tablet 3  . rOPINIRole (REQUIP) 2 MG tablet Take 2 mg by  mouth 2 (two) times daily.    [name] spironolactone (ALDACTONE) 25 MG tablet Take 1 tablet (25 mg total) by mouth daily. 90 tablet 3  . torsemide (DEMADEX) 100 MG tablet Take 1 tablet (100 mg total) by mouth 2 (two) times daily. 60 tablet 6   Current Facility-Administered Medications  Medication Dose Route Frequency Provider Last Rate Last Dose  . furosemide (LASIX) 120 mg in dextrose 5 % 50 mL IVPB  120 mg Intravenous Once Larey Dresser, MD      . potassium chloride SA (K-DUR,KLOR-CON) CR tablet 40 mEq  40 mEq Oral Once Larey Dresser, MD       Filed Vitals:   07/24/14 1046  BP: 118/66  Pulse: 68  Weight: 288 lb 12 oz (130.976 kg)  SpO2: 90%    General: NAD, obese Neck: Thick, JVP 14 cm.  No thyromegaly or thyroid nodule.  Lungs: Clear to auscultation bilaterally with normal respiratory effort. Chronic 2L oxygen CV: Nondisplaced PMI.  Heart regular S1/S2, no S3/S4, 2/6 SEM RUSB.    No carotid bruit.  Abdomen: Soft, nontender, no hepatosplenomegaly, obese Neurologic: Alert and oriented x 3.  Psych: Normal affect. Extremities: No clubbing/cyanosis, bilateral venous stasis.  2+ edema to thighs.   Assessment/Plan:  1.  Chronic diastolic CHF:  Worsening NYHA IIIb symptoms. She is markedly volume overloaded today.  - I will give her a dose of Lasix 120 mg IV in the office today with KCl 40 x 1.  - Increase torsemide to 100 mg bid.  I will give her metolazone 3 days in a row then back to every other day.  - Continue KCl 40 bid with extra 40 on days metolazone is taken.  - We discussed Cardiomems, she is interested.  I will start working on Biochemist, clinical.  2. OHS:  Followed by Dr Elsworth Soho. Per Dr Elsworth Soho no role for CPAP yet. Continue home oxygen. 3. Obesity: Difficult for her to exercise with knee pain.  Have encouraged her to be more active and watch her portion control.   4. CKD: BMET today and repeat at followup in 1 week.  5. HTN: SBP stable. Continue to follow.  6. Hyperlipidemia: Very high triglycerides.  Dr Asa Lente started Zetia in addition to fenofibrate.  Will need lipids repeated in 2 months.   Followup in 1 week.  If no improvement, will need admission for IV diuretics.     Loralie Champagne    07/24/2014

## 2014-07-24 NOTE — Patient Instructions (Addendum)
Labs today  Increase Torsemide to 100 mg twice daily. 100 mg tablets sent in to your pharmacy [facility])  Take Metolazone one tablet next three days (Friday, Saturday, Sunday), then resume to every other day on Tuesday.  Will begin workup for Cardiomems. This process takes a few weeks.  Your physician recommends that you schedule a follow-up appointment in:  1 Week  Do the following things EVERYDAY: 1) Weigh yourself in the morning before breakfast. Write it down and keep it in a log. 2) Take your medicines as prescribed 3) Eat low salt foods-Limit salt (sodium) to 2000 mg per day.  4) Stay as active as you can everyday 5) Limit all fluids for the day to less than 2 liters

## 2014-07-29 ENCOUNTER — Telehealth (HOSPITAL_COMMUNITY): Payer: Self-pay | Admitting: Cardiology

## 2014-07-29 NOTE — Telephone Encounter (Signed)
Pt aware and voiced understanding 

## 2014-07-29 NOTE — Telephone Encounter (Signed)
-----   Message from Larey Dresser, MD sent at 07/25/2014  9:23 PM EDT ----- Triglycerides better but still high.  Would add Lovaza 2 g bid.

## 2014-07-30 DIAGNOSIS — Z79899 Other long term (current) drug therapy: Secondary | ICD-10-CM | POA: Diagnosis not present

## 2014-07-30 DIAGNOSIS — M1990 Unspecified osteoarthritis, unspecified site: Secondary | ICD-10-CM | POA: Diagnosis not present

## 2014-07-30 DIAGNOSIS — M545 Low back pain: Secondary | ICD-10-CM | POA: Diagnosis not present

## 2014-07-30 DIAGNOSIS — M1710 Unilateral primary osteoarthritis, unspecified knee: Secondary | ICD-10-CM | POA: Diagnosis not present

## 2014-07-30 DIAGNOSIS — G5790 Unspecified mononeuropathy of unspecified lower limb: Secondary | ICD-10-CM | POA: Diagnosis not present

## 2014-07-30 DIAGNOSIS — M25569 Pain in unspecified knee: Secondary | ICD-10-CM | POA: Diagnosis not present

## 2014-07-30 DIAGNOSIS — G894 Chronic pain syndrome: Secondary | ICD-10-CM | POA: Diagnosis not present

## 2014-07-31 ENCOUNTER — Encounter (HOSPITAL_COMMUNITY): Payer: Self-pay

## 2014-07-31 ENCOUNTER — Ambulatory Visit (HOSPITAL_COMMUNITY)
Admission: RE | Admit: 2014-07-31 | Discharge: 2014-07-31 | Disposition: A | Discharge status: Home / Self Care | Payer: Medicare Other | Source: Ambulatory Visit | Attending: Internal Medicine | Admitting: Internal Medicine

## 2014-07-31 VITALS — BP 122/66 | HR 71 | Wt 282.5 lb

## 2014-07-31 DIAGNOSIS — Z7982 Long term (current) use of aspirin: Secondary | ICD-10-CM | POA: Insufficient documentation

## 2014-07-31 DIAGNOSIS — G4733 Obstructive sleep apnea (adult) (pediatric): Secondary | ICD-10-CM | POA: Diagnosis not present

## 2014-07-31 DIAGNOSIS — N183 Chronic kidney disease, stage 3 unspecified: Secondary | ICD-10-CM

## 2014-07-31 DIAGNOSIS — G2581 Restless legs syndrome: Secondary | ICD-10-CM | POA: Insufficient documentation

## 2014-07-31 DIAGNOSIS — E119 Type 2 diabetes mellitus without complications: Secondary | ICD-10-CM | POA: Insufficient documentation

## 2014-07-31 DIAGNOSIS — I5032 Chronic diastolic (congestive) heart failure: Secondary | ICD-10-CM | POA: Insufficient documentation

## 2014-07-31 DIAGNOSIS — I129 Hypertensive chronic kidney disease with stage 1 through stage 4 chronic kidney disease, or unspecified chronic kidney disease: Secondary | ICD-10-CM | POA: Insufficient documentation

## 2014-07-31 DIAGNOSIS — E669 Obesity, unspecified: Secondary | ICD-10-CM | POA: Insufficient documentation

## 2014-07-31 DIAGNOSIS — Z79899 Other long term (current) drug therapy: Secondary | ICD-10-CM | POA: Diagnosis not present

## 2014-07-31 DIAGNOSIS — E785 Hyperlipidemia, unspecified: Secondary | ICD-10-CM | POA: Diagnosis not present

## 2014-07-31 DIAGNOSIS — Z794 Long term (current) use of insulin: Secondary | ICD-10-CM | POA: Insufficient documentation

## 2014-07-31 DIAGNOSIS — Z9981 Dependence on supplemental oxygen: Secondary | ICD-10-CM | POA: Insufficient documentation

## 2014-07-31 LAB — BASIC METABOLIC PANEL
Anion gap: 11 (ref 5–15)
BUN: 34 mg/dL — ABNORMAL HIGH (ref 6–20)
CALCIUM: 9.6 mg/dL (ref 8.9–10.3)
CO2: 33 mmol/L — ABNORMAL HIGH (ref 22–32)
Chloride: 92 mmol/L — ABNORMAL LOW (ref 101–111)
Creatinine, Ser: 1.13 mg/dL — ABNORMAL HIGH (ref 0.44–1.00)
GFR calc non Af Amer: 48 mL/min — ABNORMAL LOW (ref >60)
GFR, EST AFRICAN AMERICAN: 56 mL/min — AB (ref >60)
GLUCOSE: 230 mg/dL — AB (ref 65–99)
Potassium: 3.3 mmol/L — ABNORMAL LOW (ref 3.5–5.1)
SODIUM: 136 mmol/L (ref 135–145)

## 2014-07-31 NOTE — Patient Outreach (Signed)
[facility]) Care Management  07/31/2014  Melissa Myers Jun 24, 1945 [id number]   Referral from MD office, assigned Mariann Laster, RN  [name]. [place], [place] Management [place] Assistant Phone: [phone] Fax: [phone]

## 2014-07-31 NOTE — Patient Instructions (Signed)
Will refer you to [employer]  Follow up 3 weeks.  Do the following things EVERYDAY: 1) Weigh yourself in the morning before breakfast. Write it down and keep it in a log. 2) Take your medicines as prescribed 3) Eat low salt foods-Limit salt (sodium) to 2000 mg per day.  4) Stay as active as you can everyday 5) Limit all fluids for the day to less than 2 liters

## 2014-07-31 NOTE — Progress Notes (Signed)
Patient ID: Melissa Myers, female   DOB: Jul 11, 1945, 69 y.o.   MRN: [id number] PCP: Dr. Asa Lente Endocrinologist: Dr Loanne Drilling  69 yo with history of HTN, DM, hyperlipidemia, OHS/OSA, and chronic dyspnea/diastolic CHF.  Patient had an echo in 04/2009 showing moderate LVH and preserved LV systolic function. However, there was a very large LV mid-cavity gradient with valsalva.  Patient developed quite significant exertional dyspnea to the point where she was short of breath walking around her house. She had a pulmonary evaluation with Dr. Elsworth Soho but no primary lung problems were identified. Patient had an ETT-myoview in 05/2009 which was negative for ischemia or infarction. Right heart cath in 05/2009 showed mildly elevated right heart filling pressures but normal PA pressure and normal PCWP. She was started her on a beta blocker (Coreg) to try to lower her LV mid-cavity gradient (that likely occurs with exertion) and to better control BP.  V/Q scan was negative for PE.  PFTs from 06/2011 showed a restrictive defect. Last echo in 06/2013 showed EF 60-65% with normal RV size and systolic function.      Admitted 12/05/13 with volume overload. Diuresed with IV lasix and transitioned to torsemide 80 mg twice a day + metolazone. Overall she diuresed 21 pounds. Discharge weight was 263 pounds.   She returns for follow up today. Last visit she had IV lasix and home diuretic regimen was increased. Not weighing at home. Says leg edema is much improved. Ambulates with a walker. Drinking less fluid. Still eating ice. Taking all medications.    Labs (10/13): K 4.1, creatinine 0.85 Labs (4/14): K 3.7, creatinine 1.2, BUN 43, BNP 23 Labs (5/14): K 4.1, creatinine 1.0 Labs (7/14): K 4.6 Creatinine 1.0 BNP 39.0  Labs (8/14): K 3.5, creatinine 0.91, BNP 71 Labs (11/14): K 3.7, creatinine 0.9 Labs (12/14): K 4.1, creatinine 0.8, BNP 52 Labs (3/15): K 4, creatinine 0.9 Labs (4/15): K 3.7, creatinine 0.9 Labs (5/15): K  3.8, creatinine 0.9, BNP 29 Labs (6/15): K 3.4, BUN 105, creatinine 0.9 =>1.7 Labs (6/17/5) K 4.2, BUN 41, Creatinine 0.80 Labs (08/19/13) K 3.2, BUN 71, creatinine 1.5 Labs 09/05/13 K 4.1 Creatinine 1.1 Labs 11/07/13 K 3.6 Creatinine 0.91 Labs 12/11/13 K 4.5 Creatine 1.05 Labs (12/15): K 3.8, creatinine 1.03 Labs (03/17/2014): K 4.1 Creatinine 0.99  Labs (4/16): K 3.7, creatinine 1.24 Labs (5/16): LDL 108, TGs 699 Labs (07/24/2014) K 3.5 Creatinine 1.17   Allergies (verified):  1) ! Sulfa  2) Morphine   Past Medical History:  1. Diabetes mellitus, type II  2. Hyperlipidemia: She has been unable to tolerate statins (has been on Vytorin, lovastatin, and Crestor) due to what sounds like rhabdo: developed muscle weakness and was told she had "muscle damage" with each of these statins. She has been told not to take statins anymore.  Has hypertriglyceridemia.  3. Hypertension 4. Obesity  5. Restless Leg Syndrome  6. OA  7. peripheral neuropathy  8. OHS/OSA: Uses supplemental oxygen, does not tolerate CPAP.   9. Chronic nausea/diarrhea: ? IBS  10. Diastolic CHF: Echo (9/24) with normal LV size, moderate LVH, EF 65-70%, LV mid-cavity gradient reaching 63 mmHg with valsalva but minimal at rest, grade I diastolic dysfunction, cannot estimate PA systolic pressure (no TR doppler signal), RV normal. RHC (4/11): Mean RA 11, RV 37/13, PA 33/15, mean PCWP 12, CI 3.3. Echo in 11/12 showed mild LVH, EF 65-70%, no LV mid-cavity gradient was mentioned.  Echo in 4/14 showed EF 65-70%, mild LVH, grade I  diastolic dysfunction, PA systolic pressure 35 mmHg, mild LV mid-cavity gradient.  Echo (5/15) with EF 60-65%, mild TR, normal RV size and systolic function.  11. ETT-myoview (4/11): 5'30", stopped due to fatigue, normal EF, no evidence for scar or ischemia. 12. V/Q scan 11/12: negative for PE.  Lower extremity venous doppler US (3/14): negative for DVT.  13. PFTs (5/13): FVC 50%, FEV1 62%, ratio 106%, DLCO 69%,  TLC 69%.   Family History:  Family History of Alcoholism/Addiction (parent)  Family History Diabetes 1st degree relative (grandparent)  Family History High cholesterol (parent, grandparent)  Family History Hypertension (parent, grandparent)  Family History Lung cancer (grandparent)  Stomach cancer (grandmother)  Celiac Sprue daughter  Mother with MI at 39, father with MI at 35, uncle with MI at 3, brother with stents in his 66s, multiple aunts with MIs and CVAs   Social History:  Never Smoked  no alcohol  married, lives with spouse and her mother  retired Network engineer - now housewife  Alcohol Use - no  Illicit Drug Use - no  Review of Systems  All systems reviewed and negative except as per HPI.   ROS: All systems reviewed and negative except as per HPI.   Current Outpatient Prescriptions  Medication Sig Dispense Refill  . aspirin EC 81 MG tablet Take 1 tablet (81 mg total) by mouth daily.    . cholecalciferol (VITAMIN D) 1000 UNITS tablet Take 1,000 Units by mouth daily.    . Coenzyme Q10 (COQ10) 30 MG CAPS Take 30 mg by mouth daily.     . DULoxetine (CYMBALTA) 60 MG capsule Take 1 capsule (60 mg total) by mouth daily. 90 capsule 3  . ezetimibe (ZETIA) 10 MG tablet Take 1 tablet (10 mg total) by mouth daily. 90 tablet 3  . fenofibrate 160 MG tablet Take 1 tablet (160 mg total) by mouth daily. 90 tablet 3  . gabapentin (NEURONTIN) 300 MG capsule Take 1 capsule (300 mg total) by mouth 4 (four) times daily. 120 capsule 3  . hydrALAZINE (APRESOLINE) 25 MG tablet Take 1 tablet (25 mg total) by mouth 3 (three) times daily. 90 tablet 3  . insulin NPH Human (NOVOLIN N) 100 UNIT/ML injection Inject 0.4 mLs (40 Units total) into the skin at bedtime. 20 mL 11  . insulin regular (HUMULIN R) 100 units/mL injection 3 times a day (just before each meal) 60-70-80 units 70 mL 11  . Insulin Syringe-Needle U-100 (B-D INSULIN SYRINGE 1CC/27G) 27G X 1/2" 1 ML MISC Use to inject insulin 4 times daily as  advised. 120 each 5  . Magnesium 200 MG TABS Take 200 mg by mouth daily.  60 each   . metolazone (ZAROXOLYN) 2.5 MG tablet Take 1 tablet (2.5 mg total) by mouth every other day. 20 tablet 3  . nystatin (MYCOSTATIN/NYSTOP) 100000 UNIT/GM POWD Apply 1 g topically 3 (three) times daily. 60 g 1  . omega-3 acid ethyl esters (LOVAZA) 1 G capsule Take 2 g by mouth 2 (two) times daily.    . potassium chloride SA (K-DUR,KLOR-CON) 20 MEQ tablet Take 2 tablets (40 mEq total) by mouth 2 (two) times daily. Take an extra 2 tabs when you take metolazone 90 tablet 3  . rOPINIRole (REQUIP) 2 MG tablet Take 2 mg by mouth 2 (two) times daily.    [name] spironolactone (ALDACTONE) 25 MG tablet Take 1 tablet (25 mg total) by mouth daily. 90 tablet 3  . torsemide (DEMADEX) 100 MG tablet Take 1 tablet (100  mg total) by mouth 2 (two) times daily. 60 tablet 6  . carvedilol (COREG) 25 MG tablet Take 1 tablet (25 mg total) by mouth 2 (two) times daily with a meal. 180 tablet 3   No current facility-administered medications for this encounter.   Filed Vitals:   07/31/14 1213  BP: 122/66  Pulse: 71  Weight: 282 lb 8 oz (128.141 kg)  SpO2: 93%    General: NAD, obese Neck: Thick, JVP 9-10 cm.  No thyromegaly or thyroid nodule.  Lungs: Clear to auscultation bilaterally with normal respiratory effort. Chronic 2L oxygen CV: Nondisplaced PMI.  Heart regular S1/S2, no S3/S4, 2/6 SEM RUSB.    No carotid bruit.  Abdomen: Soft, nontender, no hepatosplenomegaly, obese Neurologic: Alert and oriented x 3.  Psych: Normal affect. Extremities: No clubbing/cyanosis, bilateral venous stasis.  RLE and LLE 1+  edema   Assessment/Plan:  1. Chronic diastolic CHF:  Worsening NYHA IIIb symptoms.  Volume status improved. Continue torsemide to 100 mg bid.  - Continue current dose of potassium.  Encouraged to weigh and record daily.  - We discussed Cardiomems, she is interested. Insurance approval pending.  Check BMET now.   2. OHS:   Followed by Dr Elsworth Soho. Per Dr Elsworth Soho no role for CPAP yet. Continue home oxygen. 3. Obesity: Difficult for her to exercise with knee pain.  Have encouraged her to be more active and watch her portion control.   4. CKD Stage III:  BMET today and repeat at followup in 1 week.  5. HTN: SBP stable. Continue to follow.  6. Hyperlipidemia: Very high triglycerides and checked last visit. Lovaza added and she will continue Zetia and Fenofibrate.     Follow up in 3 weeks.   CLEGG,AMY    07/31/2014

## 2014-08-06 ENCOUNTER — Other Ambulatory Visit: Payer: Self-pay | Admitting: Internal Medicine

## 2014-08-12 ENCOUNTER — Other Ambulatory Visit: Payer: Self-pay

## 2014-08-12 DIAGNOSIS — E0821 Diabetes mellitus due to underlying condition with diabetic nephropathy: Secondary | ICD-10-CM

## 2014-08-12 DIAGNOSIS — I5032 Chronic diastolic (congestive) heart failure: Secondary | ICD-10-CM

## 2014-08-12 NOTE — Patient Outreach (Signed)
[facility]) Care Management  08/12/2014  Melissa Myers [birth year] [id number]   Telephonic Care Coordination Note  Referral Date:  07/31/14 Referral Source:  MD Referral Issue: Chronic Diastolic Heart Failure and CHF management at home Insurance: Triage Screening Date:  08/12/2014 Admissions:  0 ED: 0  Providers: PCP:  Rowe Clack, MD, [facility] PRIMARY CARE Cardiologist:  Dr. Loralie Champagne, ann Darrick Grinder NP,  Next appt 08/11/14 Endocrinologist:  Dr. Renato Shin, MD Pain Management: Creed Copper, [facility] [place], [place], [place]. H/o past [place] services with [place].   Social Patient lives in her home with her husband.  Ambulates with walker and c/o difficulty with ambulation relating to weight and painful arthritis.  DME:  Gilford Rile, manual W/C, scooter (given to patient), BSC, shower chair.  Falls: 0 over the past year.   Chronic Diastolic Heart Failure Patient c/o problems with swelling in the legs.   Ht:  4\' 11"  and Weight: 282 on last MD appt 2 weeks ago.  Target Weight:  Unknown by patient.   (Per Epic Note: last discharge weight was 263 pounds on 12/11/2013).  Patient admits she is not weighing daily but MD has asked patient to weigh daily.  Patient confirms she does have scales in the home.  EF:  Unknown by patient.  (Per Epic note:  last echo in 06/2013 showed EF 60-65%) States MD has advised to decrease fluid intake to less than 2 liters a day.  States she is currently getting 2 liters or less.  Followed by Dr. Loralie Champagne, Cardiologist and Amy,  Next appt 08/11/14   RN CM identifies patient non-adherent to daily weights. RN CM provided education on the importance of CHF management to avoid risk of other health complications and importance of protecting the kidneys.   Diabetes A1C 8.2 on 07/08/14   Followed by Dr. Renato Shin, MD, Endocrinologist [facility] BS under 150.  Self checks 1 time a day and twice if  she feels a need to.  Patient states MD has instructed to check at least once a day.  Neuropathy: yes Eye exam:  2015 RN CM identifies possible non-adherence to Diabetic Blood Sugar Checks as A1C is high.  RN CM identifies patient needs to improve self-management to achieve better health outcomes and lower A1C.  Pain  C/o pain associated to aggressive spreading arthritis.  Pain and weight interfere with patients ability to ambulate.  Followed monthly by  Creed Copper, [facility], [place], [place]. RN CM encouraged patient to remain compliant with MD appt's.   Medications Med Reconciliation completed this call.  Reports compliance with taking and getting all medications.  States she is able to self-manage all her medications.  Consent Patient gives verbal consent for [place] RN CM Services RN CM advised that [employer] CM will contact patient within the next 10 business days to establish best time for home visit.   RN CM advised to please notify MD of any changes in her condition prior to scheduled appt's.   RN CM provided contact name and #, 24-hour nurse line # [phone].   RN CM confirmed patient is aware of 911 services for urgent emergency needs.  Plan Referral / order sent for [place] RN CM services on 08/12/14 (CHF and DM) [place] Assistant notified of case status update:  Active for Level 4 services.   Mariann Laster, RN, BSN, MSHL, [place]  Management Coordinator [phone] Office [phone] Direct [phone] Cell

## 2014-08-13 NOTE — Patient Outreach (Signed)
[place]) Care Management  08/13/2014  Melissa Myers [birth year] [id number]   Request from Mariann Laster, RN to assign Community RN, assigned Erenest Rasher, RN.  [name]. [place], [place] Management [place] Assistant Phone: [phone] Fax: [phone]

## 2014-08-15 ENCOUNTER — Other Ambulatory Visit: Payer: Self-pay

## 2014-08-15 NOTE — Patient Outreach (Signed)
Initial telephone contact to assess need for community case management.  Patient identified herself by providing information to satisfy HIPPA identifiers. Patient admits to not being consistent with daily weights or blood sugar monitoring. Patient also admits to being in pain most of the time.  Pain located mostly in the knees, legs, hips and back.  Patient agreed to home visit with community case management to assess barriers which prevents her from weight and glucose monitoring compliance.  Plan: Home visit on June 29

## 2014-08-20 ENCOUNTER — Other Ambulatory Visit: Payer: Self-pay

## 2014-08-20 NOTE — Patient Outreach (Addendum)
[facility]) Care Management  08/20/2014  JACKQUELYN SUNDBERG [birth year] [id number]    Initial home visit for assessment of Community Case Management needs. Patient has history of diabetes with a hgA1C of 8.5 in February and chronic kidney disease. Patient and this RNCM discussed the meaning of the elevated hgA1C being equivalent to a mean blood sugar of 183. Patient and RNCM discussed the implications of an elevated blood sugar on organs of the body including kidneys, eyes and vascular system.   During this home visit, this RNCM observed patient's son bring his mother a sandwich from a Char[employer] fundraiser.  Patient dismissed this action by saying I probably will not eat it.   Patient has working scale and glucometer but states she just does not like the numbers sometimes, however, patient does agree to measurements for the next 2 weeks.    Patient states she is a fluid restriction of less than 2 liters per day.   Patient admits she does not accurately demonstrate the amount of fluids she consumes per day.    Assessment Knowledge deficit regarding fluid measurement.  Non-compliance with daily weights and recording. Inconsistent glucose monitoring.  Plan: Home visit in 2 weeks on July 13 to continue CHF and Diabetes education. Review weight and glucose readings as recorded by patient on next home visit.

## 2014-08-21 ENCOUNTER — Encounter (HOSPITAL_COMMUNITY): Payer: Self-pay

## 2014-08-21 ENCOUNTER — Ambulatory Visit (HOSPITAL_COMMUNITY)
Admission: RE | Admit: 2014-08-21 | Discharge: 2014-08-21 | Disposition: A | Discharge status: Home / Self Care | Payer: Medicare Other | Source: Ambulatory Visit | Attending: Cardiology | Admitting: Cardiology

## 2014-08-21 VITALS — BP 126/70 | HR 73 | Wt 278.0 lb

## 2014-08-21 DIAGNOSIS — Z794 Long term (current) use of insulin: Secondary | ICD-10-CM | POA: Insufficient documentation

## 2014-08-21 DIAGNOSIS — Z79899 Other long term (current) drug therapy: Secondary | ICD-10-CM | POA: Diagnosis not present

## 2014-08-21 DIAGNOSIS — G2581 Restless legs syndrome: Secondary | ICD-10-CM | POA: Diagnosis not present

## 2014-08-21 DIAGNOSIS — I5032 Chronic diastolic (congestive) heart failure: Secondary | ICD-10-CM | POA: Insufficient documentation

## 2014-08-21 DIAGNOSIS — I129 Hypertensive chronic kidney disease with stage 1 through stage 4 chronic kidney disease, or unspecified chronic kidney disease: Secondary | ICD-10-CM | POA: Insufficient documentation

## 2014-08-21 DIAGNOSIS — Z7982 Long term (current) use of aspirin: Secondary | ICD-10-CM | POA: Diagnosis not present

## 2014-08-21 DIAGNOSIS — Z9981 Dependence on supplemental oxygen: Secondary | ICD-10-CM | POA: Insufficient documentation

## 2014-08-21 DIAGNOSIS — E662 Morbid (severe) obesity with alveolar hypoventilation: Secondary | ICD-10-CM | POA: Insufficient documentation

## 2014-08-21 DIAGNOSIS — N183 Chronic kidney disease, stage 3 (moderate): Secondary | ICD-10-CM | POA: Insufficient documentation

## 2014-08-21 DIAGNOSIS — E785 Hyperlipidemia, unspecified: Secondary | ICD-10-CM | POA: Diagnosis not present

## 2014-08-21 NOTE — Patient Instructions (Signed)
FOLLOW UP in 2 MONTHS.

## 2014-08-21 NOTE — Progress Notes (Signed)
Patient ID: Melissa Myers, female   DOB: 27-Oct-1945, 69 y.o.   MRN: [id number] PCP: Dr. Asa Lente Endocrinologist: Dr Loanne Drilling  69 yo with history of HTN, DM, hyperlipidemia, OHS/OSA, and chronic dyspnea/diastolic CHF.  Patient had an echo in 04/2009 showing moderate LVH and preserved LV systolic function. However, there was a very large LV mid-cavity gradient with valsalva.  Patient developed quite significant exertional dyspnea to the point where she was short of breath walking around her house. She had a pulmonary evaluation with Dr. Elsworth Soho but no primary lung problems were identified. Patient had an ETT-myoview in 05/2009 which was negative for ischemia or infarction. Right heart cath in 05/2009 showed mildly elevated right heart filling pressures but normal PA pressure and normal PCWP. She was started her on a beta blocker (Coreg) to try to lower her LV mid-cavity gradient (that likely occurs with exertion) and to better control BP.  V/Q scan was negative for PE.  PFTs from 06/2011 showed a restrictive defect. Last echo in 06/2013 showed EF 60-65% with normal RV size and systolic function.      Admitted 12/05/13 with volume overload. Diuresed with IV lasix and transitioned to torsemide 80 mg twice a day + metolazone. Overall she diuresed 21 pounds. Discharge weight was 263 pounds.   She returns for follow up today. Last visit we discussed Cardiomems and added Lovaza for high TG. She has decided not to do Cardiomems, does not think it would improve her quality of life and does not want a change right now. Weights at home have been 276-280. Has lots of leg swelling. Ambulates with a walker at home.  Does have orthopnea, but no SOB at rest when upright.  Able to do ADLs around house without SOB. Says she is drinking less fluid, but still eating ice, actually brought a glass to her appointment. Taking all medications. Has not taken any extra torsemide. Taking metolazone every other day, says she sometimes  takes it and then forgets to take the torsemide within 30 minutes.     Labs (10/13): K 4.1, creatinine 0.85 Labs (4/14): K 3.7, creatinine 1.2, BUN 43, BNP 23 Labs (5/14): K 4.1, creatinine 1.0 Labs (7/14): K 4.6 Creatinine 1.0 BNP 39.0  Labs (8/14): K 3.5, creatinine 0.91, BNP 71 Labs (11/14): K 3.7, creatinine 0.9 Labs (12/14): K 4.1, creatinine 0.8, BNP 52 Labs (3/15): K 4, creatinine 0.9 Labs (4/15): K 3.7, creatinine 0.9 Labs (5/15): K 3.8, creatinine 0.9, BNP 29 Labs (6/15): K 3.4, BUN 105, creatinine 0.9 =>1.7 Labs (6/17/5) K 4.2, BUN 41, Creatinine 0.80 Labs (08/19/13) K 3.2, BUN 71, creatinine 1.5 Labs 09/05/13 K 4.1 Creatinine 1.1 Labs 11/07/13 K 3.6 Creatinine 0.91 Labs 12/11/13 K 4.5 Creatine 1.05 Labs (12/15): K 3.8, creatinine 1.03 Labs (03/17/2014): K 4.1 Creatinine 0.99  Labs (4/16): K 3.7, creatinine 1.24 Labs (5/16): LDL 108, TGs 699 Labs (07/24/2014) K 3.5 Creatinine 1.17  Labs (07/31/2014) K 3.3 Creatinine 1.13  Allergies (verified):  1) ! Sulfa  2) Morphine   Past Medical History:  1. Diabetes mellitus, type II  2. Hyperlipidemia: She has been unable to tolerate statins (has been on Vytorin, lovastatin, and Crestor) due to what sounds like rhabdo: developed muscle weakness and was told she had "muscle damage" with each of these statins. She has been told not to take statins anymore.  Has hypertriglyceridemia.  3. Hypertension 4. Obesity  5. Restless Leg Syndrome  6. OA  7. peripheral neuropathy  8. OHS/OSA: Uses supplemental oxygen,  does not tolerate CPAP.   9. Chronic nausea/diarrhea: ? IBS  10. Diastolic CHF: Echo (1/82) with normal LV size, moderate LVH, EF 65-70%, LV mid-cavity gradient reaching 63 mmHg with valsalva but minimal at rest, grade I diastolic dysfunction, cannot estimate PA systolic pressure (no TR doppler signal), RV normal. RHC (4/11): Mean RA 11, RV 37/13, PA 33/15, mean PCWP 12, CI 3.3. Echo in 11/12 showed mild LVH, EF 65-70%, no LV mid-cavity  gradient was mentioned.  Echo in 4/14 showed EF 65-70%, mild LVH, grade I diastolic dysfunction, PA systolic pressure 35 mmHg, mild LV mid-cavity gradient.  Echo (5/15) with EF 60-65%, mild TR, normal RV size and systolic function.  11. ETT-myoview (4/11): 5'30", stopped due to fatigue, normal EF, no evidence for scar or ischemia. 12. V/Q scan 11/12: negative for PE.  Lower extremity venous doppler US (3/14): negative for DVT.  13. PFTs (5/13): FVC 50%, FEV1 62%, ratio 106%, DLCO 69%, TLC 69%.   Family History:  Family History of Alcoholism/Addiction (parent)  Family History Diabetes 1st degree relative (grandparent)  Family History High cholesterol (parent, grandparent)  Family History Hypertension (parent, grandparent)  Family History Lung cancer (grandparent)  Stomach cancer (grandmother)  Celiac Sprue daughter  Mother with MI at 35, father with MI at 55, uncle with MI at 4, brother with stents in his 75s, multiple aunts with MIs and CVAs   Social History:  Never Smoked  no alcohol  married, lives with spouse and her mother  retired Network engineer - now housewife  Alcohol Use - no  Illicit Drug Use - no  Review of Systems  All systems reviewed and negative except as per HPI.   ROS: All systems reviewed and negative except as per HPI.   Current Outpatient Prescriptions  Medication Sig Dispense Refill  . aspirin EC 81 MG tablet Take 1 tablet (81 mg total) by mouth daily.    . carvedilol (COREG) 25 MG tablet Take 1 tablet (25 mg total) by mouth 2 (two) times daily with a meal. 180 tablet 3  . cholecalciferol (VITAMIN D) 1000 UNITS tablet Take 1,000 Units by mouth daily.    . Coenzyme Q10 (COQ10) 30 MG CAPS Take 30 mg by mouth daily.     . DULoxetine (CYMBALTA) 60 MG capsule Take 1 capsule (60 mg total) by mouth daily. 90 capsule 1  . ezetimibe (ZETIA) 10 MG tablet Take 1 tablet (10 mg total) by mouth daily. 90 tablet 3  . fenofibrate 160 MG tablet Take 1 tablet (160 mg total) by mouth  daily. 90 tablet 3  . gabapentin (NEURONTIN) 300 MG capsule Take 1 capsule (300 mg total) by mouth 4 (four) times daily. 120 capsule 3  . hydrALAZINE (APRESOLINE) 25 MG tablet Take 1 tablet (25 mg total) by mouth 3 (three) times daily. 90 tablet 3  . insulin NPH Human (NOVOLIN N) 100 UNIT/ML injection Inject 0.4 mLs (40 Units total) into the skin at bedtime. 20 mL 11  . insulin regular (HUMULIN R) 100 units/mL injection 3 times a day (just before each meal) 60-70-80 units 70 mL 11  . Insulin Syringe-Needle U-100 (B-D INSULIN SYRINGE 1CC/27G) 27G X 1/2" 1 ML MISC Use to inject insulin 4 times daily as advised. 120 each 5  . Magnesium 200 MG TABS Take 200 mg by mouth daily.  60 each   . metolazone (ZAROXOLYN) 2.5 MG tablet Take 1 tablet (2.5 mg total) by mouth every other day. 20 tablet 3  . nystatin (MYCOSTATIN/NYSTOP)  100000 UNIT/GM POWD Apply 1 g topically 3 (three) times daily. 60 g 1  . omega-3 acid ethyl esters (LOVAZA) 1 G capsule Take 2 g by mouth 2 (two) times daily.    . potassium chloride SA (K-DUR,KLOR-CON) 20 MEQ tablet Take 2 tablets (40 mEq total) by mouth 2 (two) times daily. Take an extra 2 tabs when you take metolazone 90 tablet 3  . rOPINIRole (REQUIP) 2 MG tablet Take 2 tablets (4 mg total) by mouth at bedtime. 180 tablet 1  . spironolactone (ALDACTONE) 25 MG tablet Take 1 tablet (25 mg total) by mouth daily. 90 tablet 3  . torsemide (DEMADEX) 100 MG tablet Take 1 tablet (100 mg total) by mouth 2 (two) times daily. 60 tablet 6   No current facility-administered medications for this encounter.   Filed Vitals:   08/21/14 1415  BP: 126/70  Pulse: 73  Weight: 278 lb (126.1 kg)  SpO2: 94%    General: NAD, obese Neck: Thick, JVP 9-10 cm.  No thyromegaly or thyroid nodule.  Lungs: Clear to auscultation bilaterally with normal respiratory effort. Chronic 2L oxygen CV: Nondisplaced PMI.  Heart regular S1/S2, no S3/S4, 2/6 SEM RUSB.    No carotid bruit.  Abdomen: Soft, nontender,  no hepatosplenomegaly, obese Neurologic: Alert and oriented x 3.  Psych: Normal affect. Extremities: No clubbing/cyanosis, bilateral venous stasis.  RLE and LLE 1+  edema   EKG: NSR 71 BPM  Assessment/Plan:  1. Chronic diastolic CHF:  Have discussed cardiomemes at previous appointments and she has decided not to pursue. Worsening NYHA IIIb symptoms.  - Continue torsemide to 100 mg bid.  - Continue current dose of potassium.  - Encouraged to weigh and record daily.  2. OHS:   - Followed by Dr Elsworth Soho. Per Dr Elsworth Soho no role for CPAP yet. Continue home oxygen. 3. Obesity: Difficult for her to exercise with knee pain.  Have encouraged her to be more active and watch her portion control.   4. CKD Stage III:    5. HTN: SBP stable. Continue to follow.  6. Hyperlipidemia: Very high triglycerides and checked 07/24/14. Lovaza added and she will continue Zetia and Fenofibrate.     Follow up in 2 months Broxton Broady NP-C  2:46 PM

## 2014-08-27 DIAGNOSIS — G5790 Unspecified mononeuropathy of unspecified lower limb: Secondary | ICD-10-CM | POA: Diagnosis not present

## 2014-08-27 DIAGNOSIS — M545 Low back pain: Secondary | ICD-10-CM | POA: Diagnosis not present

## 2014-08-27 DIAGNOSIS — G2581 Restless legs syndrome: Secondary | ICD-10-CM | POA: Diagnosis not present

## 2014-08-27 DIAGNOSIS — G894 Chronic pain syndrome: Secondary | ICD-10-CM | POA: Diagnosis not present

## 2014-08-27 DIAGNOSIS — M1710 Unilateral primary osteoarthritis, unspecified knee: Secondary | ICD-10-CM | POA: Diagnosis not present

## 2014-09-03 ENCOUNTER — Other Ambulatory Visit: Payer: Self-pay

## 2014-09-03 NOTE — Patient Outreach (Signed)
[facility]) Care Management  09/03/2014  Melissa Myers 12-08-1945 [id number]   Home visit to assist patient by identifying community case management needs and to continue CHF education. Patient states she was in a lot of pain in her right ankle and buttocks area.  Patient states she recently was seen at a [facility], next appointment is end of this month.  Patient denies increased shortness of breath, no distress noted. Patient states she weighs everyday, recordings noted in [facility] Journal/Calendar provided to her on previous visit  Patient denies increasing activities and admits to a very sedentary life.  EMMI Video viewed by patient.  Video reinforced teachings of this RNCM and [facility] regarding need to decrease sodium intake in her diet, identification of food which are high in sodium.  Patient also viewed EMMI Video on Hypertension which reviewed etiology, risk factors for hypertension and techniques to assist with management of hypertension.    Patient agrees to attempt to increase activities by increasing activities in the home.  Patient's 22 year old mother lives with patient and is very active and provides assistance to patient during the day.     Plan: Home visit next month to continue assistance with case management needs.  Refer patient to [facility] Disease Management for further chronic disease education.

## 2014-09-04 ENCOUNTER — Other Ambulatory Visit: Payer: Self-pay | Admitting: Internal Medicine

## 2014-09-04 ENCOUNTER — Other Ambulatory Visit (HOSPITAL_COMMUNITY): Payer: Self-pay | Admitting: Cardiology

## 2014-09-04 NOTE — Telephone Encounter (Signed)
Fenofibrate rx sent to pharm

## 2014-09-06 ENCOUNTER — Other Ambulatory Visit (HOSPITAL_COMMUNITY): Payer: Self-pay | Admitting: Cardiology

## 2014-09-09 ENCOUNTER — Other Ambulatory Visit (HOSPITAL_COMMUNITY): Payer: Self-pay | Admitting: *Deleted

## 2014-09-22 ENCOUNTER — Other Ambulatory Visit: Payer: Self-pay

## 2014-09-22 ENCOUNTER — Ambulatory Visit: Payer: Medicare Other | Admitting: Internal Medicine

## 2014-09-22 NOTE — Patient Outreach (Signed)
This RNCM received voice mail from patient stating she was having drainage and pain in her right lower extremity. This RNCM returned call to patient who identified herself using HIPPA identifiers  Patient stated her right lower extremity was oozing fluids with intense pain. Patient denies bleeding, stated the oozing was from a site that was previously ulcerated.  This RNCM made call to Dr. Katheren Puller office, got same day appointment with Dr. Linna Darner. Call made to patient to advise her of appointment and time.  Patient confirms having transportation.   This RNCM advised patient to call 911 if area starts bleeding.  Plan: Telephonic contact with patient on tomorrow, August 2 to follow up wit appointment.

## 2014-09-23 ENCOUNTER — Ambulatory Visit (INDEPENDENT_AMBULATORY_CARE_PROVIDER_SITE_OTHER): Payer: Medicare Other | Admitting: Family

## 2014-09-23 ENCOUNTER — Encounter: Payer: Self-pay | Admitting: Family

## 2014-09-23 ENCOUNTER — Other Ambulatory Visit: Payer: Self-pay

## 2014-09-23 VITALS — BP 138/60 | HR 72 | Temp 98.4°F | Ht 59.0 in | Wt 287.0 lb

## 2014-09-23 DIAGNOSIS — S81809A Unspecified open wound, unspecified lower leg, initial encounter: Secondary | ICD-10-CM | POA: Insufficient documentation

## 2014-09-23 DIAGNOSIS — S81801D Unspecified open wound, right lower leg, subsequent encounter: Secondary | ICD-10-CM | POA: Diagnosis not present

## 2014-09-23 MED ORDER — DOXYCYCLINE HYCLATE 100 MG PO TABS: 100.0000 mg | ORAL_TABLET | Freq: Two times a day (BID) | ORAL | Status: DC

## 2014-09-23 NOTE — Progress Notes (Signed)
Pre visit review using our clinic review tool, if applicable. No additional management support is needed unless otherwise documented below in the visit note. 

## 2014-09-23 NOTE — Progress Notes (Signed)
Subjective:    Patient ID: Melissa Myers, female    DOB: 01/08/1946, 69 y.o.   MRN: [id number]  Chief Complaint  Patient presents with  . Wound Check    old sores, oozing/wheeping    HPI:  Melissa Myers is a 69 y.o. female with a PMH of diabetes, restless leg syndrome, morbid obesity, hypertension, dyslipidemia, depression, chronic kidney disease stage III, back pain, anemia, and arthritis who presents today for an acute office visit.   1.) Wounds on legs - Associated symptom of leg swelling and oozing has been going on for about 2 weeks. The location of the oozing is on the anterior aspect of her right lower leg. Notes that she has a previous history weeping sores that healed over and now have opened again. There is some associated pain. Describes the pain as a bone like pain. Currently maintained on torsemide for her heart failure. Denies chest pain or shortness of breath. Denies any trauma to the area. Previously noted to be treated by [facility] home health on a monthly basis.    Allergies  Allergen Reactions  . Statins Other (See Comments)    Statin drugs cause muscle pain / "muscle damage"--was told by MD not to take  . Sulfa Antibiotics Diarrhea  . Levemir [Insulin Detemir] Itching  . Morphine Other (See Comments)    GI upset and headaches  . Zinc Swelling and Rash    Current Outpatient Prescriptions on File Prior to Visit  Medication Sig Dispense Refill  . aspirin EC 81 MG tablet Take 1 tablet (81 mg total) by mouth daily.    . carvedilol (COREG) 25 MG tablet Take 1 tablet (25 mg total) by mouth 2 (two) times daily with a meal. 180 tablet 3  . cholecalciferol (VITAMIN D) 1000 UNITS tablet Take 1,000 Units by mouth daily.    . Coenzyme Q10 (COQ10) 30 MG CAPS Take 30 mg by mouth daily.     . DULoxetine (CYMBALTA) 60 MG capsule Take 1 capsule (60 mg total) by mouth daily. 90 capsule 1  . ezetimibe (ZETIA) 10 MG tablet Take 1 tablet (10 mg total) by mouth daily. 90  tablet 3  . fenofibrate 160 MG tablet TAKE 1 TABLET BY MOUTH DAILY 30 tablet 4  . gabapentin (NEURONTIN) 300 MG capsule Take 1 capsule (300 mg total) by mouth 4 (four) times daily. 120 capsule 3  . hydrALAZINE (APRESOLINE) 25 MG tablet Take 1 tablet (25 mg total) by mouth 3 (three) times daily. 90 tablet 3  . insulin NPH Human (NOVOLIN N) 100 UNIT/ML injection Inject 0.4 mLs (40 Units total) into the skin at bedtime. 20 mL 11  . insulin regular (HUMULIN R) 100 units/mL injection 3 times a day (just before each meal) 60-70-80 units 70 mL 11  . Insulin Syringe-Needle U-100 (B-D INSULIN SYRINGE 1CC/27G) 27G X 1/2" 1 ML MISC Use to inject insulin 4 times daily as advised. 120 each 5  . Magnesium 200 MG TABS Take 200 mg by mouth daily.  60 each   . metolazone (ZAROXOLYN) 2.5 MG tablet Take 1 tablet (2.5 mg total) by mouth every other day. 20 tablet 3  . nystatin (MYCOSTATIN/NYSTOP) 100000 UNIT/GM POWD Apply 1 g topically 3 (three) times daily. 60 g 1  . omega-3 acid ethyl esters (LOVAZA) 1 G capsule Take 2 g by mouth 2 (two) times daily.    . potassium chloride SA (K-DUR,KLOR-CON) 20 MEQ tablet Take 2 tablets (40 mEq total) by mouth  2 (two) times daily. Take an extra 2 tabs when you take metolazone 90 tablet 3  . rOPINIRole (REQUIP) 2 MG tablet Take 2 tablets (4 mg total) by mouth at bedtime. 180 tablet 1  . spironolactone (ALDACTONE) 25 MG tablet Take 1 tablet (25 mg total) by mouth daily. 90 tablet 3  . torsemide (DEMADEX) 100 MG tablet Take 1 tablet (100 mg total) by mouth 2 (two) times daily. 60 tablet 6   No current facility-administered medications on file prior to visit.    Review of Systems  Constitutional: Negative for fever and chills.  Skin: Positive for wound.      Objective:    BP 138/60 mmHg  Pulse 72  Temp(Src) 98.4 F (36.9 C) (Oral)  Ht 4\' 11"  (1.499 m)  Wt 287 lb (130.182 kg)  BMI 57.94 kg/m2  SpO2 95% Nursing note and vital signs reviewed.  Physical Exam    Constitutional: She is oriented to person, place, and time. She appears well-developed and well-nourished. No distress.  Obese female seated in the chair, appears older than her stated age, with reddened legs.   Cardiovascular: Normal rate, regular rhythm, normal heart sounds and intact distal pulses.   Pulmonary/Chest: Effort normal and breath sounds normal.  Neurological: She is alert and oriented to person, place, and time.  Skin: Skin is warm and dry.  1 cm x 1 cm annular ulceration noted on the anterior shin with dried yellow discharge. 3-4 smaller annular lesions noted distally with scab formation and no evidence of discharge. Tender to touch and slightly elevated in skin temperature.   Psychiatric: She has a normal mood and affect. Her behavior is normal. Judgment and thought content normal.       Assessment & Plan:   Problem List Items Addressed This Visit      Other   Open wound of leg - Primary    2-3 small lower extremity ulcers/wounds noted. Start doxycycline to cover for MRSA. Keep wound clean and dry to allow for improved healing environment. Given medical history of CHF and diabetes refer to wound center to assist with closure of ulcer. Follow up if symptoms worsen or fail to improve.       Relevant Medications   doxycycline (VIBRA-TABS) 100 MG tablet   Other Relevant Orders   AMB referral to wound care center

## 2014-09-23 NOTE — Patient Instructions (Signed)
Thank you for choosing [facility].  Summary/Instructions:  Please keep clean and dry with soap and water.  Cover as needed to whisk moisture. Avoid antibiotic creams.   Your prescription(s) have been submitted to your pharmacy or been printed and provided for you. Please take as directed and contact our office if you believe you are having problem(s) with the medication(s) or have any questions.  If your symptoms worsen or fail to improve, please contact our office for further instruction, or in case of emergency go directly to the emergency room at the closest medical facility.

## 2014-09-23 NOTE — Patient Outreach (Signed)
Unsuccessful attempt made to contact patient via telephone to follow up with her appointment with primary care physician.   This RNCM left HIPPA compliant message for patient with this RNCM's contact information.  Plan: Home visit later this month-routine and scheduled

## 2014-09-23 NOTE — Assessment & Plan Note (Signed)
2-3 small lower extremity ulcers/wounds noted. Start doxycycline to cover for MRSA. Keep wound clean and dry to allow for improved healing environment. Given medical history of CHF and diabetes refer to wound center to assist with closure of ulcer. Follow up if symptoms worsen or fail to improve.

## 2014-10-07 ENCOUNTER — Other Ambulatory Visit: Payer: Self-pay

## 2014-10-07 NOTE — Patient Outreach (Signed)
[facility]) Care Management  10/07/2014  Melissa Myers 1945-04-11 [id number]   This RNCM arrived at patient's home for scheduled appointment.  Was greeted at door by patient's husband. Husband stated patient had been in a lot of pain earlier, had taken pain medications and was asleep. This RNCM and husband agreed to telephone on tomorrow to reschedule appointment.

## 2014-10-08 ENCOUNTER — Telehealth: Payer: Self-pay | Admitting: Internal Medicine

## 2014-10-08 NOTE — Telephone Encounter (Signed)
Patients daughter is calling to advise that she gave antibiotcs her son had to the patient and the sores/ rashes are healing within 1 day. She is asking that we call in triamcinolne acetonide ointment %0.1. Please give her a call.

## 2014-10-09 ENCOUNTER — Other Ambulatory Visit (HOSPITAL_COMMUNITY): Payer: Self-pay | Admitting: Cardiology

## 2014-10-09 MED ORDER — TRIAMCINOLONE ACETONIDE 0.1 % EX CREA: 1.0000 "application " | TOPICAL_CREAM | Freq: Two times a day (BID) | CUTANEOUS | Status: DC

## 2014-10-09 NOTE — Telephone Encounter (Signed)
Medication sent.

## 2014-10-10 ENCOUNTER — Encounter (HOSPITAL_BASED_OUTPATIENT_CLINIC_OR_DEPARTMENT_OTHER): Payer: Medicare Other

## 2014-10-10 ENCOUNTER — Other Ambulatory Visit (HOSPITAL_COMMUNITY): Payer: Self-pay | Admitting: *Deleted

## 2014-10-10 MED ORDER — TORSEMIDE 100 MG PO TABS: 100.0000 mg | ORAL_TABLET | Freq: Two times a day (BID) | ORAL | Status: DC

## 2014-10-16 ENCOUNTER — Other Ambulatory Visit (HOSPITAL_COMMUNITY): Payer: Self-pay | Admitting: Internal Medicine

## 2014-10-20 ENCOUNTER — Ambulatory Visit: Payer: Medicare Other | Admitting: Internal Medicine

## 2014-10-20 ENCOUNTER — Ambulatory Visit (HOSPITAL_COMMUNITY)
Admission: RE | Admit: 2014-10-20 | Discharge: 2014-10-20 | Disposition: A | Discharge status: Home / Self Care | Payer: Medicare Other | Source: Ambulatory Visit | Attending: Cardiology | Admitting: Cardiology

## 2014-10-20 VITALS — BP 152/64 | HR 72 | Wt 285.0 lb

## 2014-10-20 DIAGNOSIS — Z794 Long term (current) use of insulin: Secondary | ICD-10-CM | POA: Insufficient documentation

## 2014-10-20 DIAGNOSIS — I5032 Chronic diastolic (congestive) heart failure: Secondary | ICD-10-CM | POA: Insufficient documentation

## 2014-10-20 DIAGNOSIS — G4733 Obstructive sleep apnea (adult) (pediatric): Secondary | ICD-10-CM | POA: Insufficient documentation

## 2014-10-20 DIAGNOSIS — E669 Obesity, unspecified: Secondary | ICD-10-CM | POA: Insufficient documentation

## 2014-10-20 DIAGNOSIS — N183 Chronic kidney disease, stage 3 unspecified: Secondary | ICD-10-CM

## 2014-10-20 DIAGNOSIS — I129 Hypertensive chronic kidney disease with stage 1 through stage 4 chronic kidney disease, or unspecified chronic kidney disease: Secondary | ICD-10-CM | POA: Insufficient documentation

## 2014-10-20 DIAGNOSIS — E785 Hyperlipidemia, unspecified: Secondary | ICD-10-CM | POA: Insufficient documentation

## 2014-10-20 DIAGNOSIS — Z833 Family history of diabetes mellitus: Secondary | ICD-10-CM | POA: Insufficient documentation

## 2014-10-20 DIAGNOSIS — E1122 Type 2 diabetes mellitus with diabetic chronic kidney disease: Secondary | ICD-10-CM | POA: Insufficient documentation

## 2014-10-20 DIAGNOSIS — E1142 Type 2 diabetes mellitus with diabetic polyneuropathy: Secondary | ICD-10-CM | POA: Insufficient documentation

## 2014-10-20 DIAGNOSIS — Z8249 Family history of ischemic heart disease and other diseases of the circulatory system: Secondary | ICD-10-CM | POA: Insufficient documentation

## 2014-10-20 DIAGNOSIS — Z9981 Dependence on supplemental oxygen: Secondary | ICD-10-CM | POA: Insufficient documentation

## 2014-10-20 DIAGNOSIS — G2581 Restless legs syndrome: Secondary | ICD-10-CM | POA: Insufficient documentation

## 2014-10-20 DIAGNOSIS — Z79899 Other long term (current) drug therapy: Secondary | ICD-10-CM | POA: Insufficient documentation

## 2014-10-20 DIAGNOSIS — M545 Low back pain: Secondary | ICD-10-CM

## 2014-10-20 DIAGNOSIS — Z7982 Long term (current) use of aspirin: Secondary | ICD-10-CM | POA: Insufficient documentation

## 2014-10-20 LAB — BASIC METABOLIC PANEL
Anion gap: 10 (ref 5–15)
BUN: 41 mg/dL — AB (ref 6–20)
CHLORIDE: 92 mmol/L — AB (ref 101–111)
CO2: 34 mmol/L — AB (ref 22–32)
Calcium: 9.3 mg/dL (ref 8.9–10.3)
Creatinine, Ser: 1.47 mg/dL — ABNORMAL HIGH (ref 0.44–1.00)
GFR calc Af Amer: 41 mL/min — ABNORMAL LOW (ref >60)
GFR calc non Af Amer: 35 mL/min — ABNORMAL LOW (ref >60)
Glucose, Bld: 274 mg/dL — ABNORMAL HIGH (ref 65–99)
POTASSIUM: 4.8 mmol/L (ref 3.5–5.1)
SODIUM: 136 mmol/L (ref 135–145)

## 2014-10-20 NOTE — Patient Instructions (Addendum)
RESTART Torsemide 100mg , twice daily RESTART Metoloazone 2.5mg , one tab every other day  Labs today  You have been referred to [facility] to assist with heart failure management, they will be in contact to set up a time to begin services  Your physician recommends that you schedule a follow-up appointment in: 4 weeks  Do the following things EVERYDAY: 1) Weigh yourself in the morning before breakfast. Write it down and keep it in a log. 2) Take your medicines as prescribed 3) Eat low salt foods-Limit salt (sodium) to 2000 mg per day.  4) Stay as active as you can everyday 5) Limit all fluids for the day to less than 2 liters 6)

## 2014-10-20 NOTE — Progress Notes (Addendum)
Advanced Heart Failure Medication Review by a Pharmacist  Does the patient  feel that his/her medications are working for him/her?  no  Has the patient been experiencing any side effects to the medications prescribed?  no  Does the patient measure his/her own blood pressure or blood glucose at home?  yes   Does the patient have any problems obtaining medications due to transportation or finances?   no  Understanding of regimen: fair Understanding of indications: fair Potential of compliance: poor    Pharmacist comments:  Melissa Myers is a pleasant 69 yo F presenting without a medication list. She seems to have a fair understanding of her regimen but has not taken any of her diuretics (torsemide or metolazone) for about a week or longer because she feels they are not working for her. She has also not been taking any potassium supplementation for about a month since she has an esophageal cyst and is not able to swallow the tablets adequately. We discussed potentially switching her to liquid supplementation which she is agreeable to. She also stated that she only checks her BG about 1-2 times per week and continues to take her meal time Humulin R regardless of whether or not she eats a meal. She states that she is not having any s/s of hypoglycemia but I encouraged her to test her BG with meals a few times a week. All of her medications were reconciled today.  Melissa Myers. Melissa Myers, PharmD, BCPS, CPP Clinical Pharmacist Pager: [phone] Phone: [phone] 10/20/2014 2:29 PM

## 2014-10-20 NOTE — Progress Notes (Signed)
Patient ID: Melissa Myers, female   DOB: 1945-12-14, 69 y.o.   MRN: [id number] PCP: Dr. Asa Lente Endocrinologist: Dr Loanne Drilling  69 yo with history of HTN, DM, hyperlipidemia, OHS/OSA, and chronic dyspnea/diastolic CHF.  Patient had an echo in 04/2009 showing moderate LVH and preserved LV systolic function. However, there was a very large LV mid-cavity gradient with valsalva.  Patient developed quite significant exertional dyspnea to the point where she was short of breath walking around her house. She had a pulmonary evaluation with Dr. Elsworth Soho but no primary lung problems were identified. Patient had an ETT-myoview in 05/2009 which was negative for ischemia or infarction. Right heart cath in 05/2009 showed mildly elevated right heart filling pressures but normal PA pressure and normal PCWP. She was started her on a beta blocker (Coreg) to try to lower her LV mid-cavity gradient (that likely occurs with exertion) and to better control BP.  V/Q scan was negative for PE.  PFTs from 06/2011 showed a restrictive defect. Last echo in 06/2013 showed EF 60-65% with normal RV size and systolic function.      Admitted 12/05/13 with volume overload. Diuresed with IV lasix and transitioned to torsemide 80 mg twice a day + metolazone. Overall she diuresed 21 pounds. Discharge weight was 263 pounds.   She returns for follow up today. Has not been taking torsemide, metolazone, or potassium in last week. Continues to drink lots of water. Not weighing at home. Mild dyspnea with exertion. Denies PND. + Orthopnea sleeps on 2-3 pillows per day. Tries to follow low salt diet. Eating lots of ice and drinking lots of fluids. f    Labs (10/13): K 4.1, creatinine 0.85 Labs (4/14): K 3.7, creatinine 1.2, BUN 43, BNP 23 Labs (5/14): K 4.1, creatinine 1.0 Labs (7/14): K 4.6 Creatinine 1.0 BNP 39.0  Labs (8/14): K 3.5, creatinine 0.91, BNP 71 Labs (11/14): K 3.7, creatinine 0.9 Labs (12/14): K 4.1, creatinine 0.8, BNP 52 Labs  (3/15): K 4, creatinine 0.9 Labs (4/15): K 3.7, creatinine 0.9 Labs (5/15): K 3.8, creatinine 0.9, BNP 29 Labs (6/15): K 3.4, BUN 105, creatinine 0.9 =>1.7 Labs (6/17/5) K 4.2, BUN 41, Creatinine 0.80 Labs (08/19/13) K 3.2, BUN 71, creatinine 1.5 Labs 09/05/13 K 4.1 Creatinine 1.1 Labs 11/07/13 K 3.6 Creatinine 0.91 Labs 12/11/13 K 4.5 Creatine 1.05 Labs (12/15): K 3.8, creatinine 1.03 Labs (03/17/2014): K 4.1 Creatinine 0.99  Labs (4/16): K 3.7, creatinine 1.24 Labs (5/16): LDL 108, TGs 699 Labs (07/24/2014) K 3.5 Creatinine 1.17  Labs (07/31/2014) K 3.3 Creatinine 1.13  Allergies (verified):  1) ! Sulfa  2) Morphine   Past Medical History:  1. Diabetes mellitus, type II  2. Hyperlipidemia: She has been unable to tolerate statins (has been on Vytorin, lovastatin, and Crestor) due to what sounds like rhabdo: developed muscle weakness and was told she had "muscle damage" with each of these statins. She has been told not to take statins anymore.  Has hypertriglyceridemia.  3. Hypertension 4. Obesity  5. Restless Leg Syndrome  6. OA  7. peripheral neuropathy  8. OHS/OSA: Uses supplemental oxygen, does not tolerate CPAP.   9. Chronic nausea/diarrhea: ? IBS  10. Diastolic CHF: Echo (2/45) with normal LV size, moderate LVH, EF 65-70%, LV mid-cavity gradient reaching 63 mmHg with valsalva but minimal at rest, grade I diastolic dysfunction, cannot estimate PA systolic pressure (no TR doppler signal), RV normal. RHC (4/11): Mean RA 11, RV 37/13, PA 33/15, mean PCWP 12, CI 3.3. Echo in 11/12  showed mild LVH, EF 65-70%, no LV mid-cavity gradient was mentioned.  Echo in 4/14 showed EF 65-70%, mild LVH, grade I diastolic dysfunction, PA systolic pressure 35 mmHg, mild LV mid-cavity gradient.  Echo (5/15) with EF 60-65%, mild TR, normal RV size and systolic function.  11. ETT-myoview (4/11): 5'30", stopped due to fatigue, normal EF, no evidence for scar or ischemia. 12. V/Q scan 11/12: negative for PE.   Lower extremity venous doppler US (3/14): negative for DVT.  13. PFTs (5/13): FVC 50%, FEV1 62%, ratio 106%, DLCO 69%, TLC 69%.   Family History:  Family History of Alcoholism/Addiction (parent)  Family History Diabetes 1st degree relative (grandparent)  Family History High cholesterol (parent, grandparent)  Family History Hypertension (parent, grandparent)  Family History Lung cancer (grandparent)  Stomach cancer (grandmother)  Celiac Sprue daughter  Mother with MI at 101, father with MI at 43, uncle with MI at 23, brother with stents in his 33s, multiple aunts with MIs and CVAs   Social History:  Never Smoked  no alcohol  married, lives with spouse and her mother  retired Network engineer - now housewife  Alcohol Use - no  Illicit Drug Use - no  Review of Systems  All systems reviewed and negative except as per HPI.   ROS: All systems reviewed and negative except as per HPI.   Current Outpatient Prescriptions  Medication Sig Dispense Refill  . aspirin EC 81 MG tablet Take 1 tablet (81 mg total) by mouth daily.    . carvedilol (COREG) 25 MG tablet Take 1 tablet (25 mg total) by mouth 2 (two) times daily with a meal. 180 tablet 3  . cholecalciferol (VITAMIN D) 1000 UNITS tablet Take 1,000 Units by mouth daily.    . Coenzyme Q10 (COQ10) 30 MG CAPS Take 30 mg by mouth daily.     . DULoxetine (CYMBALTA) 60 MG capsule Take 1 capsule (60 mg total) by mouth daily. 90 capsule 1  . ezetimibe (ZETIA) 10 MG tablet Take 1 tablet (10 mg total) by mouth daily. 90 tablet 3  . fenofibrate 160 MG tablet TAKE 1 TABLET BY MOUTH DAILY 30 tablet 4  . gabapentin (NEURONTIN) 300 MG capsule Take 1 capsule (300 mg total) by mouth 4 (four) times daily. 120 capsule 3  . hydrALAZINE (APRESOLINE) 25 MG tablet Take 1 tablet (25 mg total) by mouth 3 (three) times daily. 90 tablet 3  . insulin NPH Human (NOVOLIN N) 100 UNIT/ML injection Inject 0.4 mLs (40 Units total) into the skin at bedtime. 20 mL 11  . insulin  regular (HUMULIN R) 100 units/mL injection 3 times a day (just before each meal) 60-70-80 units 70 mL 11  . Insulin Syringe-Needle U-100 (B-D INSULIN SYRINGE 1CC/27G) 27G X 1/2" 1 ML MISC Use to inject insulin 4 times daily as advised. 120 each 5  . Magnesium 200 MG TABS Take 200 mg by mouth daily.  60 each   . metolazone (ZAROXOLYN) 2.5 MG tablet Take 1 tablet (2.5 mg total) by mouth every other day. (Patient taking differently: Take 2.5 mg by mouth as needed. ) 20 tablet 3  . omega-3 acid ethyl esters (LOVAZA) 1 G capsule Take 2 g by mouth 2 (two) times daily.    [name] rOPINIRole (REQUIP) 2 MG tablet Take 2 tablets (4 mg total) by mouth at bedtime. (Patient taking differently: Take 2 mg by mouth every 12 (twelve) hours. ) 180 tablet 1  . spironolactone (ALDACTONE) 25 MG tablet Take 1 tablet (25  mg total) by mouth daily. 90 tablet 3  . triamcinolone cream (KENALOG) 0.1 % Apply 1 application topically 2 (two) times daily. 30 g 0  . torsemide (DEMADEX) 100 MG tablet Take 1 tablet (100 mg total) by mouth 2 (two) times daily. (Patient not taking: Reported on 10/20/2014) 60 tablet 6   No current facility-administered medications for this encounter.   Filed Vitals:   10/20/14 1410  BP: 152/64  Pulse: 72  Weight: 285 lb (129.275 kg)  SpO2: 95%    General: NAD, obese. Husband present.  Neck: Thick, JVP to jaw.  No thyromegaly or thyroid nodule.  Lungs: Clear to auscultation bilaterally with normal respiratory effort. Chronic 2L oxygen CV: Nondisplaced PMI.  Heart regular S1/S2, no S3/S4, 2/6 SEM RUSB.    No carotid bruit.  Abdomen: Soft, nontender, no hepatosplenomegaly, obese Neurologic: Alert and oriented x 3.  Psych: Normal affect. Extremities: No clubbing/cyanosis, bilateral venous stasis.  RLE and LLE 2-3 +  edema    Assessment/Plan:  1. Chronic diastolic CHF:  Have discussed cardiomemes at previous appointments and she has decided not to pursue. Worsening NYHA IIIb symptoms.  Volume status  elevated likely because she has not been taking diuretics for over a week.  I have asked her to re- start  torsemide to 100 mg bid, metolazone every other day.  Change potassium to liquid form.  40 meq twice daily.  Reinforced medication and fluid compliance.  2. OHS:   - Followed by Dr Elsworth Soho. Per Dr Elsworth Soho no role for CPAP yet. Continue home oxygen. 3. Obesity: Difficult for her to exercise with knee pain.  Have encouraged her to be more active and watch her portion control.   4. CKD Stage III:    5. HTN: elevated but I am not convinced she is taking her medications. Stressed the importance of medication compliance.   6. Hyperlipidemia: Very high triglycerides and checked 07/24/14. Lovaza added and she will continue Zetia and Fenofibrate.     Follow up in 4 weeks . BMET today and Thursday. Refer to [facility] with diuretic protocol.  Emmelina Mcloughlin NP-C  2:22 PM

## 2014-10-21 ENCOUNTER — Other Ambulatory Visit (HOSPITAL_COMMUNITY): Payer: Self-pay

## 2014-10-21 MED ORDER — CIPROFLOXACIN HCL 250 MG PO TABS: 250.0000 mg | ORAL_TABLET | Freq: Two times a day (BID) | ORAL | Status: DC

## 2014-10-21 NOTE — Telephone Encounter (Signed)
Patient made aware of + UTI and order to start cipro 250mg  BID x 1 week.  Rx sent to preferred pharmacy electronically.  Renee Pain

## 2014-10-22 ENCOUNTER — Ambulatory Visit: Payer: Medicare Other | Admitting: Endocrinology

## 2014-10-22 ENCOUNTER — Inpatient Hospital Stay (HOSPITAL_COMMUNITY)
Admission: EM | Admit: 2014-10-22 | Discharge: 2014-10-26 | DRG: 871 | Disposition: A | Discharge status: Home Health | Payer: Medicare Other | Attending: Internal Medicine | Admitting: Internal Medicine

## 2014-10-22 ENCOUNTER — Emergency Department (HOSPITAL_COMMUNITY): Payer: Medicare Other

## 2014-10-22 ENCOUNTER — Other Ambulatory Visit: Payer: Self-pay

## 2014-10-22 ENCOUNTER — Encounter (HOSPITAL_COMMUNITY): Payer: Self-pay | Admitting: Emergency Medicine

## 2014-10-22 DIAGNOSIS — G9341 Metabolic encephalopathy: Secondary | ICD-10-CM | POA: Diagnosis present

## 2014-10-22 DIAGNOSIS — I5032 Chronic diastolic (congestive) heart failure: Secondary | ICD-10-CM

## 2014-10-22 DIAGNOSIS — J9611 Chronic respiratory failure with hypoxia: Secondary | ICD-10-CM | POA: Diagnosis not present

## 2014-10-22 DIAGNOSIS — N183 Chronic kidney disease, stage 3 unspecified: Secondary | ICD-10-CM | POA: Diagnosis present

## 2014-10-22 DIAGNOSIS — K219 Gastro-esophageal reflux disease without esophagitis: Secondary | ICD-10-CM | POA: Diagnosis present

## 2014-10-22 DIAGNOSIS — I129 Hypertensive chronic kidney disease with stage 1 through stage 4 chronic kidney disease, or unspecified chronic kidney disease: Secondary | ICD-10-CM | POA: Diagnosis present

## 2014-10-22 DIAGNOSIS — H9190 Unspecified hearing loss, unspecified ear: Secondary | ICD-10-CM | POA: Diagnosis present

## 2014-10-22 DIAGNOSIS — R1030 Lower abdominal pain, unspecified: Secondary | ICD-10-CM | POA: Diagnosis not present

## 2014-10-22 DIAGNOSIS — E785 Hyperlipidemia, unspecified: Secondary | ICD-10-CM | POA: Diagnosis present

## 2014-10-22 DIAGNOSIS — Z79899 Other long term (current) drug therapy: Secondary | ICD-10-CM

## 2014-10-22 DIAGNOSIS — I5033 Acute on chronic diastolic (congestive) heart failure: Secondary | ICD-10-CM | POA: Diagnosis not present

## 2014-10-22 DIAGNOSIS — R404 Transient alteration of awareness: Secondary | ICD-10-CM | POA: Diagnosis not present

## 2014-10-22 DIAGNOSIS — N390 Urinary tract infection, site not specified: Secondary | ICD-10-CM | POA: Diagnosis not present

## 2014-10-22 DIAGNOSIS — F329 Major depressive disorder, single episode, unspecified: Secondary | ICD-10-CM | POA: Diagnosis present

## 2014-10-22 DIAGNOSIS — M79604 Pain in right leg: Secondary | ICD-10-CM | POA: Diagnosis present

## 2014-10-22 DIAGNOSIS — Z792 Long term (current) use of antibiotics: Secondary | ICD-10-CM

## 2014-10-22 DIAGNOSIS — E1029 Type 1 diabetes mellitus with other diabetic kidney complication: Secondary | ICD-10-CM | POA: Diagnosis present

## 2014-10-22 DIAGNOSIS — R0602 Shortness of breath: Secondary | ICD-10-CM | POA: Diagnosis not present

## 2014-10-22 DIAGNOSIS — Z794 Long term (current) use of insulin: Secondary | ICD-10-CM

## 2014-10-22 DIAGNOSIS — R4182 Altered mental status, unspecified: Secondary | ICD-10-CM | POA: Diagnosis not present

## 2014-10-22 DIAGNOSIS — R6520 Severe sepsis without septic shock: Secondary | ICD-10-CM | POA: Diagnosis not present

## 2014-10-22 DIAGNOSIS — A401 Sepsis due to streptococcus, group B: Secondary | ICD-10-CM | POA: Diagnosis present

## 2014-10-22 DIAGNOSIS — A419 Sepsis, unspecified organism: Principal | ICD-10-CM | POA: Diagnosis present

## 2014-10-22 DIAGNOSIS — M79605 Pain in left leg: Secondary | ICD-10-CM | POA: Diagnosis present

## 2014-10-22 DIAGNOSIS — E1040 Type 1 diabetes mellitus with diabetic neuropathy, unspecified: Secondary | ICD-10-CM | POA: Diagnosis present

## 2014-10-22 DIAGNOSIS — Z6841 Body Mass Index (BMI) 40.0 and over, adult: Secondary | ICD-10-CM | POA: Diagnosis not present

## 2014-10-22 DIAGNOSIS — E1065 Type 1 diabetes mellitus with hyperglycemia: Secondary | ICD-10-CM | POA: Diagnosis present

## 2014-10-22 DIAGNOSIS — G4733 Obstructive sleep apnea (adult) (pediatric): Secondary | ICD-10-CM | POA: Diagnosis present

## 2014-10-22 DIAGNOSIS — I10 Essential (primary) hypertension: Secondary | ICD-10-CM | POA: Diagnosis present

## 2014-10-22 DIAGNOSIS — Z7982 Long term (current) use of aspirin: Secondary | ICD-10-CM

## 2014-10-22 DIAGNOSIS — G4730 Sleep apnea, unspecified: Secondary | ICD-10-CM

## 2014-10-22 LAB — URINE MICROSCOPIC-ADD ON

## 2014-10-22 LAB — CBC WITH DIFFERENTIAL/PLATELET
BASOS PCT: 0 % (ref 0–1)
Basophils Absolute: 0 10*3/uL (ref 0.0–0.1)
Eosinophils Absolute: 0.1 10*3/uL (ref 0.0–0.7)
Eosinophils Relative: 0 % (ref 0–5)
HCT: 37.5 % (ref 36.0–46.0)
HEMOGLOBIN: 11.9 g/dL — AB (ref 12.0–15.0)
Lymphocytes Relative: 10 % — ABNORMAL LOW (ref 12–46)
Lymphs Abs: 1.7 10*3/uL (ref 0.7–4.0)
MCH: 26.9 pg (ref 26.0–34.0)
MCHC: 31.7 g/dL (ref 30.0–36.0)
MCV: 84.8 fL (ref 78.0–100.0)
MONO ABS: 1 10*3/uL (ref 0.1–1.0)
MONOS PCT: 5 % (ref 3–12)
NEUTROS PCT: 85 % — AB (ref 43–77)
Neutro Abs: 15.1 10*3/uL — ABNORMAL HIGH (ref 1.7–7.7)
Platelets: 307 10*3/uL (ref 150–400)
RBC: 4.42 MIL/uL (ref 3.87–5.11)
RDW: 15.5 % (ref 11.5–15.5)
WBC: 17.9 10*3/uL — ABNORMAL HIGH (ref 4.0–10.5)

## 2014-10-22 LAB — URINALYSIS, ROUTINE W REFLEX MICROSCOPIC
BILIRUBIN URINE: NEGATIVE
Glucose, UA: NEGATIVE mg/dL
HGB URINE DIPSTICK: NEGATIVE
KETONES UR: NEGATIVE mg/dL
Nitrite: NEGATIVE
PH: 7.5 (ref 5.0–8.0)
Protein, ur: NEGATIVE mg/dL
Specific Gravity, Urine: 1.012 (ref 1.005–1.030)
Urobilinogen, UA: 0.2 mg/dL (ref 0.0–1.0)

## 2014-10-22 LAB — COMPREHENSIVE METABOLIC PANEL
ALT: 21 U/L (ref 14–54)
AST: 33 U/L (ref 15–41)
Albumin: 3.3 g/dL — ABNORMAL LOW (ref 3.5–5.0)
Alkaline Phosphatase: 57 U/L (ref 38–126)
Anion gap: 13 (ref 5–15)
BUN: 34 mg/dL — ABNORMAL HIGH (ref 6–20)
CALCIUM: 9.5 mg/dL (ref 8.9–10.3)
CO2: 34 mmol/L — AB (ref 22–32)
CREATININE: 1.19 mg/dL — AB (ref 0.44–1.00)
Chloride: 96 mmol/L — ABNORMAL LOW (ref 101–111)
GFR, EST AFRICAN AMERICAN: 53 mL/min — AB (ref >60)
GFR, EST NON AFRICAN AMERICAN: 45 mL/min — AB (ref >60)
Glucose, Bld: 110 mg/dL — ABNORMAL HIGH (ref 65–99)
Potassium: 3.8 mmol/L (ref 3.5–5.1)
SODIUM: 143 mmol/L (ref 135–145)
Total Bilirubin: 0.8 mg/dL (ref 0.3–1.2)
Total Protein: 7.1 g/dL (ref 6.5–8.1)

## 2014-10-22 LAB — I-STAT CG4 LACTIC ACID, ED: LACTIC ACID, VENOUS: 2.78 mmol/L — AB (ref 0.5–2.0)

## 2014-10-22 LAB — URINE CULTURE: Culture: >=100000

## 2014-10-22 MED ORDER — PIPERACILLIN-TAZOBACTAM 3.375 G IVPB 30 MIN
3.3750 g | Freq: Once | INTRAVENOUS | Status: AC
  Administered 2014-10-22: 3.375 g via INTRAVENOUS
  Filled 2014-10-22: qty 50

## 2014-10-22 MED ORDER — PIPERACILLIN-TAZOBACTAM 3.375 G IVPB
3.3750 g | Freq: Three times a day (TID) | INTRAVENOUS | Status: DC
  Administered 2014-10-23 (×3): 3.375 g via INTRAVENOUS
  Filled 2014-10-22 (×7): qty 50

## 2014-10-22 MED ORDER — ONETOUCH VERIO FLEX SYSTEM W/DEVICE KIT: 1.0000 | PACK | Freq: Once | Status: DC

## 2014-10-22 MED ORDER — VANCOMYCIN HCL 10 G IV SOLR
2000.0000 mg | Freq: Once | INTRAVENOUS | Status: AC
Start: 2014-10-22 — End: 2014-10-23
  Administered 2014-10-23: 2000 mg via INTRAVENOUS
  Filled 2014-10-22: qty 2000

## 2014-10-22 MED ORDER — VANCOMYCIN HCL 10 G IV SOLR
1500.0000 mg | INTRAVENOUS | Status: DC
  Administered 2014-10-23: 1500 mg via INTRAVENOUS
  Filled 2014-10-22 (×2): qty 1500

## 2014-10-22 MED ORDER — SODIUM CHLORIDE 0.9 % IV BOLUS (SEPSIS)
1000.0000 mL | INTRAVENOUS | Status: AC
  Administered 2014-10-22 – 2014-10-23 (×4): 1000 mL via INTRAVENOUS

## 2014-10-22 MED ORDER — ONETOUCH LANCETS MISC: Status: DC

## 2014-10-22 MED ORDER — VANCOMYCIN HCL IN DEXTROSE 1-5 GM/200ML-% IV SOLN: 1000.0000 mg | Freq: Once | INTRAVENOUS | Status: DC

## 2014-10-22 MED ORDER — GLUCOSE BLOOD VI STRP: ORAL_STRIP | Status: DC

## 2014-10-22 NOTE — ED Notes (Signed)
Reported wbc of 17.9 and lactic of 2.78 to Dr. Vanita Panda. IV access obtained. MD acknowledges, sepsis workup and planning for admission.

## 2014-10-22 NOTE — ED Notes (Signed)
Per EMS, diagnosed with uti yesterday, taking cipro. No pain, "doesn't feel well". Patient is from home. Always on 3-5 L of nasal cannula at home. VS: bp 138/76, p 78, 18rr, 98% on 3L, cbg 108.hx of DM.

## 2014-10-22 NOTE — ED Provider Notes (Signed)
CSN: [id number]     Arrival date & time 10/22/14  2114 History   First MD Initiated Contact with Patient 10/22/14 2115     Chief Complaint  Patient presents with  . Fatigue     HPI   Patient presents with concern of fatigue, fever. She is here with family members who provide history of present illness. The patient is listless, moving all extremities, speaking briefly, clearly, but incapable of described why she is here. It seems as though over the past day, patient has become more somnolent, with anorexia, substantially decreased participation in activities of daily living. No report of new fall, vomiting, overt confusion. Patient with recent diagnosis of UTI, and family neurologist that the patient likely has been taking one medication for several days. She also has history of neuropathy, chronic pain, takes opiates. Level V caveat secondary to change in mental status. Past Medical History  Diagnosis Date  . Type II or unspecified type diabetes mellitus without mention of complication, not stated as uncontrolled     insulin dep  . Hyperlipidemia     hx rhabdo on statins  . Hypertension   . Diastolic CHF   . Chronic diarrhea     a/w nausea - felt related to IBS  . RLS (restless legs syndrome)   . Osteoarthritis   . Stasis dermatitis   . GERD (gastroesophageal reflux disease)   . On home oxygen therapy     uses oxygen 2 liters min per [place] at night and prn during day  . Anemia   . Neuropathy     feet, toes and fingers  . Disc degeneration, lumbar   . OSA (obstructive sleep apnea)     05/2009 sleep study - refuses CPAP  . Deaf     left side only  . Shortness of breath     chronic   Past Surgical History  Procedure Laterality Date  . Tubal ligation  1980  . Cholecystectomy  1997  . Uterine polyp removal  2008  . Umbilical hernia repair  1995  . Tonsillectomy  1970  . Colonoscopy N/A 12/03/2012    Procedure: COLONOSCOPY;  Surgeon: Lafayette Dragon, MD;  Location: [facility]  ENDOSCOPY;  Service: Endoscopy;  Laterality: N/A;   Family History  Problem Relation Age of Onset  . Heart disease Mother   . Heart disease Father   . Heart disease      family history  . Stomach cancer Paternal Grandmother   . Lung cancer Paternal Grandfather   . Colon cancer Neg Hx   . CVA      several aunts  . Heart attack Mother 4  . Heart attack Father 60  . Heart attack      several aunts and an uncle   Social History  Substance Use Topics  . Smoking status: Never Smoker   . Smokeless tobacco: Never Used  . Alcohol Use: No   OB History    No data available     Review of Systems  Unable to perform ROS: Mental status change      Allergies  Statins; Sulfa antibiotics; Levemir; Morphine; and Zinc  Home Medications   Prior to Admission medications   Medication Sig Start Date End Date Taking? Authorizing Provider  aspirin EC 81 MG tablet Take 1 tablet (81 mg total) by mouth daily. 07/08/14   Rowe Clack, MD  Blood Glucose Monitoring Suppl (ONETOUCH VERIO FLEX SYSTEM) W/DEVICE KIT 1 kit by Does not apply route  once. 10/22/14   Renato Shin, MD  carvedilol (COREG) 25 MG tablet Take 1 tablet (25 mg total) by mouth 2 (two) times daily with a meal. 07/08/14   Rowe Clack, MD  cholecalciferol (VITAMIN D) 1000 UNITS tablet Take 1,000 Units by mouth daily.    Historical Provider, MD  ciprofloxacin (CIPRO) 250 MG tablet Take 1 tablet (250 mg total) by mouth 2 (two) times daily. 10/21/14   Amy D Ninfa Meeker, NP  Coenzyme Q10 (COQ10) 30 MG CAPS Take 30 mg by mouth daily.     Historical Provider, MD  DULoxetine (CYMBALTA) 60 MG capsule Take 1 capsule (60 mg total) by mouth daily. 08/07/14   Rowe Clack, MD  ezetimibe (ZETIA) 10 MG tablet Take 1 tablet (10 mg total) by mouth daily. 07/08/14   Rowe Clack, MD  fenofibrate 160 MG tablet TAKE 1 TABLET BY MOUTH DAILY 09/04/14   Rowe Clack, MD  gabapentin (NEURONTIN) 300 MG capsule Take 1 capsule (300 mg total)  by mouth 4 (four) times daily. 07/08/14   Rowe Clack, MD  glucose blood [facility] VERIO) test strip Test 3 time a day 10/22/14   Renato Shin, MD  hydrALAZINE (APRESOLINE) 25 MG tablet Take 1 tablet (25 mg total) by mouth 3 (three) times daily. 06/16/14   Shaune Pascal Bensimhon, MD  insulin NPH Human (NOVOLIN N) 100 UNIT/ML injection Inject 0.4 mLs (40 Units total) into the skin at bedtime. 07/22/14   Renato Shin, MD  insulin regular (HUMULIN R) 100 units/mL injection 3 times a day (just before each meal) 60-70-80 units 07/22/14   Renato Shin, MD  Insulin Syringe-Needle U-100 (B-D INSULIN SYRINGE 1CC/27G) 27G X 1/2" 1 ML MISC Use to inject insulin 4 times daily as advised. 06/02/14   Elayne Snare, MD  Magnesium 200 MG TABS Take 200 mg by mouth daily.  05/29/12   Larey Dresser, MD  metolazone (ZAROXOLYN) 2.5 MG tablet Take 1 tablet (2.5 mg total) by mouth every other day. 07/24/14   Larey Dresser, MD  omega-3 acid ethyl esters (LOVAZA) 1 G capsule Take 2 g by mouth 2 (two) times daily.    Historical Provider, MD  ONE TOUCH LANCETS MISC Use one lancet per blood sugar testing.3 times a day 10/22/14   Renato Shin, MD  rOPINIRole (REQUIP) 2 MG tablet Take 2 mg by mouth 2 (two) times daily.    Historical Provider, MD  spironolactone (ALDACTONE) 25 MG tablet Take 1 tablet (25 mg total) by mouth daily. 07/08/14   Rowe Clack, MD  torsemide (DEMADEX) 100 MG tablet Take 1 tablet (100 mg total) by mouth 2 (two) times daily. Patient not taking: Reported on 10/20/2014 10/10/14   Larey Dresser, MD  triamcinolone cream (KENALOG) 0.1 % Apply 1 application topically 2 (two) times daily. 10/09/14   Golden Circle, FNP   BP 131/40 mmHg  Pulse 77  Temp(Src) 102.5 F (39.2 C) (Oral)  Resp 25  Ht 4' 10" (1.473 m)  SpO2 100% Physical Exam  Constitutional: She appears well-developed and well-nourished. She appears listless. No distress.  Obese elderly female resting in bed, answering questions, minimally,  disoriented  HENT:  Head: Normocephalic and atraumatic.  Eyes: Conjunctivae and EOM are normal.  Cardiovascular: Normal rate and regular rhythm.   Pulmonary/Chest: Effort normal and breath sounds normal. No stridor. No respiratory distress.  Abdominal: She exhibits no distension.  Musculoskeletal: She exhibits no edema.  Neurological: She appears listless. She displays no  atrophy and no tremor. No cranial nerve deficit or sensory deficit. She exhibits normal muscle tone. She displays no seizure activity.  Skin: Skin is warm and dry.  Psychiatric: She is withdrawn. Thought content is delusional. Cognition and memory are impaired.  Nursing note and vitals reviewed.   ED Course  Procedures (including critical care time) Labs Review Labs Reviewed  COMPREHENSIVE METABOLIC PANEL - Abnormal; Notable for the following:    Chloride 96 (*)    CO2 34 (*)    Glucose, Bld 110 (*)    BUN 34 (*)    Creatinine, Ser 1.19 (*)    Albumin 3.3 (*)    GFR calc non Af Amer 45 (*)    GFR calc Af Amer 53 (*)    All other components within normal limits  CBC WITH DIFFERENTIAL/PLATELET - Abnormal; Notable for the following:    WBC 17.9 (*)    Hemoglobin 11.9 (*)    Neutrophils Relative % 85 (*)    Neutro Abs 15.1 (*)    Lymphocytes Relative 10 (*)    All other components within normal limits  URINALYSIS, ROUTINE W REFLEX MICROSCOPIC (NOT AT [facility]) - Abnormal; Notable for the following:    APPearance CLOUDY (*)    Leukocytes, UA MODERATE (*)    All other components within normal limits  URINE MICROSCOPIC-ADD ON - Abnormal; Notable for the following:    Squamous Epithelial / LPF FEW (*)    All other components within normal limits  I-STAT CG4 LACTIC ACID, ED - Abnormal; Notable for the following:    Lactic Acid, Venous 2.78 (*)    All other components within normal limits  CULTURE, BLOOD (ROUTINE X 2)  CULTURE, BLOOD (ROUTINE X 2)  BLOOD GAS, ARTERIAL  HEMOGLOBIN A1C    EKG with sinus rhythm,  rate 79, intraventricular conduction delay, T-wave changes, abnormal  Patient is hypoxic on room air, but 98% with 3 L nasal cannula abnormal   Imaging Review Ct Head Wo Contrast  10/23/2014   CLINICAL DATA:  Altered mental status  EXAM: CT HEAD WITHOUT CONTRAST  TECHNIQUE: Contiguous axial images were obtained from the base of the skull through the vertex without intravenous contrast.  COMPARISON:  11/27/2012 MRI  FINDINGS: There is no intracranial hemorrhage, mass or evidence of acute infarction. There is mild generalized atrophy. There is mild chronic microvascular ischemic change. There is no significant extra-axial fluid collection.  No acute intracranial findings are evident.  IMPRESSION: No acute intracranial findings. There is mild generalized atrophy and chronic small vessel disease.   Electronically Signed   By: Andreas Newport M.D.   On: 10/23/2014 00:17   Dg Chest Port 1 View  10/22/2014   CLINICAL DATA:  Shortness of breath, weakness, sepsis  EXAM: PORTABLE CHEST - 1 VIEW  COMPARISON:  08/01/2013  FINDINGS: Moderate enlargement of the cardiac silhouette noted with central vascular congestion. No focal pulmonary opacity. No pleural effusion. Cardiac leads obscure detail.  IMPRESSION: Cardiomegaly with central vascular congestion but no focal acute finding.   Electronically Signed   By: Conchita Paris M.D.   On: 10/22/2014 22:59   I have personally reviewed and evaluated these images and lab results as part of my medical decision-making.  Initial temperature 103  Initial labs notable for leukocytosis, lactic acidosis. With altered mental status, concern for sepsis, broad-spectrum antibiotics will start, patient receiving empiric fluids, will continue receiving appropriate volume resuscitation.  Sepsis - Repeat Assessment  Performed at:    0025  Vitals     Blood pressure 125/44, pulse 78, temperature 102.5 F (39.2 C), temperature source Oral, resp. rate 30, height 4' 10" (1.473  m), weight 290 lb (131.543 kg), SpO2 99 %.  Heart:     Regular rate and rhythm  Lungs:    CTA  Capillary Refill:   > 2 sec  Peripheral Pulse:   Radial pulse palpable  Skin:     Pale  12:28 AM Patient now speaking clearly, much more alert.   MDM  Patient presents with altered mental status, somnolence, and on initial exam is listless, the protecting her airway. Patient has multiple medical issues, including chronic respiratory disease, chronic pain and was recently diagnosed with urinary tract infection, but is unclear if she ever started antibiotics. Patient's initial evaluation is notable for leukocytosis, lactic acidosis, and she was treated empirically with fluids, antibiotics. Given the concern for sepsis, after initiation of broad-spectrum antibiotics, fluid resuscitation, she was admitted for further evaluation and management.  CRITICAL CARE Performed by: Carmin Muskrat Total critical care time: 40 Critical care time was exclusive of separately billable procedures and treating other patients. Critical care was necessary to treat or prevent imminent or life-threatening deterioration. Critical care was time spent personally by me on the following activities: development of treatment plan with patient and/or surrogate as well as nursing, discussions with consultants, evaluation of patient's response to treatment, examination of patient, obtaining history from patient or surrogate, ordering and performing treatments and interventions, ordering and review of laboratory studies, ordering and review of radiographic studies, pulse oximetry and re-evaluation of patient's condition.    Carmin Muskrat, MD 10/23/14 Shelah Lewandowsky

## 2014-10-22 NOTE — ED Notes (Signed)
Spoke to Dr. Vanita Panda in regards to plan of care, no code sepsis at this time, labs being drawn.

## 2014-10-22 NOTE — ED Notes (Signed)
2 missed iv attempts.

## 2014-10-22 NOTE — ED Notes (Signed)
Eme, 2nd RN at bedside for iv access attempt

## 2014-10-22 NOTE — ED Notes (Signed)
Called CT to report patient is ready for transport.

## 2014-10-22 NOTE — Progress Notes (Signed)
ANTIBIOTIC CONSULT NOTE - INITIAL  Pharmacy Consult for vanc/zosyn Indication: rule out sepsis  Allergies  Allergen Reactions  . Cymbalta [Duloxetine Hcl] Other (See Comments)    Restless leg syndrome  . Statins Other (See Comments)    Statin drugs cause muscle pain / "muscle damage"--was told by MD not to take  . Sulfa Antibiotics Diarrhea  . Levemir [Insulin Detemir] Itching  . Morphine Other (See Comments)    GI upset and headaches  . Zinc Swelling and Rash    Patient Measurements: Height: 4\' 10"  (147.3 cm) Weight: 290 lb (131.543 kg) IBW/kg (Calculated) : 40.9    Vital Signs: Temp: 102.5 F (39.2 C) (08/31 2118) Temp Source: Oral (08/31 2118) BP: 142/41 mmHg (08/31 2215) Pulse Rate: 77 (08/31 2215) Intake/Output from previous day:   Intake/Output from this shift:    Labs:  Recent Labs  10/20/14 1518 10/22/14 2155  WBC  --  17.9*  HGB  --  11.9*  PLT  --  307  CREATININE 1.47* 1.19*   Estimated Creatinine Clearance: 54.3 mL/min (by C-G formula based on Cr of 1.19). No results for input(s): VANCOTROUGH, VANCOPEAK, VANCORANDOM, GENTTROUGH, GENTPEAK, GENTRANDOM, TOBRATROUGH, TOBRAPEAK, TOBRARND, AMIKACINPEAK, AMIKACINTROU, AMIKACIN in the last 72 hours.   Microbiology: Recent Results (from the past 720 hour(s))  Urine culture     Status: None   Collection Time: 10/20/14  3:20 PM  Result Value Ref Range Status   Specimen Description URINE, RANDOM  Final   Special Requests NONE  Final   Culture >=100,000 COLONIES/mL ESCHERICHIA COLI  Final   Report Status 10/22/2014 FINAL  Final   Organism ID, Bacteria ESCHERICHIA COLI  Final      Susceptibility   Escherichia coli - MIC*    AMPICILLIN 4 SENSITIVE Sensitive     CEFAZOLIN <=4 SENSITIVE Sensitive     CEFTRIAXONE <=1 SENSITIVE Sensitive     CIPROFLOXACIN <=0.25 SENSITIVE Sensitive     GENTAMICIN <=1 SENSITIVE Sensitive     IMIPENEM <=0.25 SENSITIVE Sensitive     NITROFURANTOIN <=16 SENSITIVE Sensitive    TRIMETH/SULFA <=20 SENSITIVE Sensitive     AMPICILLIN/SULBACTAM 4 SENSITIVE Sensitive     PIP/TAZO <=4 SENSITIVE Sensitive     * >=100,000 COLONIES/mL ESCHERICHIA COLI    Medical History: Past Medical History  Diagnosis Date  . Type II or unspecified type diabetes mellitus without mention of complication, not stated as uncontrolled     insulin dep  . Hyperlipidemia     hx rhabdo on statins  . Hypertension   . Diastolic CHF   . Chronic diarrhea     a/w nausea - felt related to IBS  . RLS (restless legs syndrome)   . Osteoarthritis   . Stasis dermatitis   . GERD (gastroesophageal reflux disease)   . On home oxygen therapy     uses oxygen 2 liters min per  at night and prn during day  . Anemia   . Neuropathy     feet, toes and fingers  . Disc degeneration, lumbar   . OSA (obstructive sleep apnea)     05/2009 sleep study - refuses CPAP  . Deaf     left side only  . Shortness of breath     chronic    Assessment: 89 yof to ED with sepsis. Pharmacy consulted to dose vanc/zosyn. Febrile 102.5, wbc 17.9 on admit. SCr 1.19, normalized CrCl~50.  8/31 vanc>> 8/31 zosyn>>  8/31 BCx2>>  Goal of Therapy:  Vancomycin trough level 15-20 mcg/ml  Plan:  Vanc 2g IV x 1 dose; then Vanc 1500mg  IV q24h Zosyn 3.375g IV (43min inf) x 1 dose; then Zosyn 3.375g IV q8h Mon clinical progress, c/s, renal function, abx plan VT@SS  as indicated  Elicia Lamp, PharmD Clinical Pharmacist Pager [phone] 10/22/2014 10:47 PM

## 2014-10-22 NOTE — ED Notes (Signed)
Portable x-ray at the bedside.  

## 2014-10-22 NOTE — ED Notes (Signed)
Phlebotomy at the bedside  

## 2014-10-23 ENCOUNTER — Other Ambulatory Visit: Payer: Self-pay

## 2014-10-23 DIAGNOSIS — E1040 Type 1 diabetes mellitus with diabetic neuropathy, unspecified: Secondary | ICD-10-CM | POA: Diagnosis present

## 2014-10-23 DIAGNOSIS — Z79899 Other long term (current) drug therapy: Secondary | ICD-10-CM | POA: Diagnosis not present

## 2014-10-23 DIAGNOSIS — N183 Chronic kidney disease, stage 3 (moderate): Secondary | ICD-10-CM

## 2014-10-23 DIAGNOSIS — N3000 Acute cystitis without hematuria: Secondary | ICD-10-CM

## 2014-10-23 DIAGNOSIS — Z794 Long term (current) use of insulin: Secondary | ICD-10-CM | POA: Diagnosis not present

## 2014-10-23 DIAGNOSIS — Z792 Long term (current) use of antibiotics: Secondary | ICD-10-CM | POA: Diagnosis not present

## 2014-10-23 DIAGNOSIS — F329 Major depressive disorder, single episode, unspecified: Secondary | ICD-10-CM | POA: Diagnosis present

## 2014-10-23 DIAGNOSIS — G9341 Metabolic encephalopathy: Secondary | ICD-10-CM | POA: Diagnosis present

## 2014-10-23 DIAGNOSIS — M79605 Pain in left leg: Secondary | ICD-10-CM | POA: Diagnosis present

## 2014-10-23 DIAGNOSIS — A401 Sepsis due to streptococcus, group B: Secondary | ICD-10-CM | POA: Diagnosis present

## 2014-10-23 DIAGNOSIS — G4733 Obstructive sleep apnea (adult) (pediatric): Secondary | ICD-10-CM | POA: Diagnosis present

## 2014-10-23 DIAGNOSIS — R6520 Severe sepsis without septic shock: Secondary | ICD-10-CM | POA: Diagnosis present

## 2014-10-23 DIAGNOSIS — H9190 Unspecified hearing loss, unspecified ear: Secondary | ICD-10-CM | POA: Diagnosis present

## 2014-10-23 DIAGNOSIS — M79604 Pain in right leg: Secondary | ICD-10-CM | POA: Diagnosis not present

## 2014-10-23 DIAGNOSIS — E785 Hyperlipidemia, unspecified: Secondary | ICD-10-CM | POA: Diagnosis present

## 2014-10-23 DIAGNOSIS — A419 Sepsis, unspecified organism: Secondary | ICD-10-CM | POA: Diagnosis present

## 2014-10-23 DIAGNOSIS — A4151 Sepsis due to Escherichia coli [E. coli]: Secondary | ICD-10-CM

## 2014-10-23 DIAGNOSIS — Z6841 Body Mass Index (BMI) 40.0 and over, adult: Secondary | ICD-10-CM | POA: Diagnosis not present

## 2014-10-23 DIAGNOSIS — J9611 Chronic respiratory failure with hypoxia: Secondary | ICD-10-CM | POA: Diagnosis present

## 2014-10-23 DIAGNOSIS — N390 Urinary tract infection, site not specified: Secondary | ICD-10-CM | POA: Diagnosis not present

## 2014-10-23 DIAGNOSIS — K219 Gastro-esophageal reflux disease without esophagitis: Secondary | ICD-10-CM | POA: Diagnosis present

## 2014-10-23 DIAGNOSIS — E1029 Type 1 diabetes mellitus with other diabetic kidney complication: Secondary | ICD-10-CM | POA: Diagnosis not present

## 2014-10-23 DIAGNOSIS — I5032 Chronic diastolic (congestive) heart failure: Secondary | ICD-10-CM | POA: Diagnosis not present

## 2014-10-23 DIAGNOSIS — I5033 Acute on chronic diastolic (congestive) heart failure: Secondary | ICD-10-CM | POA: Diagnosis present

## 2014-10-23 DIAGNOSIS — E1065 Type 1 diabetes mellitus with hyperglycemia: Secondary | ICD-10-CM | POA: Diagnosis present

## 2014-10-23 DIAGNOSIS — R4182 Altered mental status, unspecified: Secondary | ICD-10-CM | POA: Diagnosis not present

## 2014-10-23 DIAGNOSIS — Z7982 Long term (current) use of aspirin: Secondary | ICD-10-CM | POA: Diagnosis not present

## 2014-10-23 DIAGNOSIS — I129 Hypertensive chronic kidney disease with stage 1 through stage 4 chronic kidney disease, or unspecified chronic kidney disease: Secondary | ICD-10-CM | POA: Diagnosis present

## 2014-10-23 DIAGNOSIS — R404 Transient alteration of awareness: Secondary | ICD-10-CM | POA: Diagnosis not present

## 2014-10-23 DIAGNOSIS — I10 Essential (primary) hypertension: Secondary | ICD-10-CM | POA: Diagnosis not present

## 2014-10-23 LAB — BASIC METABOLIC PANEL
Anion gap: 13 (ref 5–15)
BUN: 29 mg/dL — AB (ref 6–20)
CALCIUM: 8.3 mg/dL — AB (ref 8.9–10.3)
CO2: 30 mmol/L (ref 22–32)
Chloride: 98 mmol/L — ABNORMAL LOW (ref 101–111)
Creatinine, Ser: 1.2 mg/dL — ABNORMAL HIGH (ref 0.44–1.00)
GFR calc Af Amer: 52 mL/min — ABNORMAL LOW (ref >60)
GFR, EST NON AFRICAN AMERICAN: 45 mL/min — AB (ref >60)
GLUCOSE: 161 mg/dL — AB (ref 65–99)
POTASSIUM: 3.3 mmol/L — AB (ref 3.5–5.1)
Sodium: 141 mmol/L (ref 135–145)

## 2014-10-23 LAB — GLUCOSE, CAPILLARY
GLUCOSE-CAPILLARY: 167 mg/dL — AB (ref 65–99)
GLUCOSE-CAPILLARY: 323 mg/dL — AB (ref 65–99)
GLUCOSE-CAPILLARY: 267 mg/dL — AB (ref 65–99)
Glucose-Capillary: 187 mg/dL — ABNORMAL HIGH (ref 65–99)
Glucose-Capillary: 278 mg/dL — ABNORMAL HIGH (ref 65–99)

## 2014-10-23 LAB — I-STAT ARTERIAL BLOOD GAS, ED
ACID-BASE EXCESS: 10 mmol/L — AB (ref 0.0–2.0)
Bicarbonate: 35.1 mEq/L — ABNORMAL HIGH (ref 20.0–24.0)
O2 SAT: 96 %
PCO2 ART: 54.7 mmHg — AB (ref 35.0–45.0)
PH ART: 7.423 (ref 7.350–7.450)
PO2 ART: 95 mmHg (ref 80.0–100.0)
Patient temperature: 102.5
TCO2: 37 mmol/L (ref 0–100)

## 2014-10-23 LAB — CBC
HEMATOCRIT: 33.8 % — AB (ref 36.0–46.0)
Hemoglobin: 10.3 g/dL — ABNORMAL LOW (ref 12.0–15.0)
MCH: 25.9 pg — AB (ref 26.0–34.0)
MCHC: 30.5 g/dL (ref 30.0–36.0)
MCV: 84.9 fL (ref 78.0–100.0)
Platelets: 279 10*3/uL (ref 150–400)
RBC: 3.98 MIL/uL (ref 3.87–5.11)
RDW: 15.8 % — AB (ref 11.5–15.5)
WBC: 14.6 10*3/uL — ABNORMAL HIGH (ref 4.0–10.5)

## 2014-10-23 LAB — LACTIC ACID, PLASMA: Lactic Acid, Venous: 1.4 mmol/L (ref 0.5–2.0)

## 2014-10-23 LAB — MRSA PCR SCREENING: MRSA by PCR: NEGATIVE

## 2014-10-23 MED ORDER — ONETOUCH VERIO FLEX SYSTEM W/DEVICE KIT: 1.0000 | PACK | Freq: Once | Status: DC

## 2014-10-23 MED ORDER — INSULIN NPH (HUMAN) (ISOPHANE) 100 UNIT/ML ~~LOC~~ SUSP
40.0000 [IU] | Freq: Every day | SUBCUTANEOUS | Status: DC
  Administered 2014-10-23 – 2014-10-25 (×3): 40 [IU] via SUBCUTANEOUS
  Filled 2014-10-23: qty 10

## 2014-10-23 MED ORDER — ENOXAPARIN SODIUM 60 MG/0.6ML ~~LOC~~ SOLN
60.0000 mg | SUBCUTANEOUS | Status: DC
  Administered 2014-10-23 – 2014-10-26 (×4): 60 mg via SUBCUTANEOUS
  Filled 2014-10-23 (×4): qty 0.6

## 2014-10-23 MED ORDER — EZETIMIBE 10 MG PO TABS
10.0000 mg | ORAL_TABLET | Freq: Every day | ORAL | Status: DC
  Administered 2014-10-23 – 2014-10-26 (×4): 10 mg via ORAL
  Filled 2014-10-23 (×5): qty 1

## 2014-10-23 MED ORDER — SODIUM CHLORIDE 0.9 % IJ SOLN
3.0000 mL | Freq: Two times a day (BID) | INTRAMUSCULAR | Status: DC
  Administered 2014-10-23 – 2014-10-25 (×4): 3 mL via INTRAVENOUS

## 2014-10-23 MED ORDER — INSULIN ASPART 100 UNIT/ML ~~LOC~~ SOLN
0.0000 [IU] | Freq: Every day | SUBCUTANEOUS | Status: DC
  Administered 2014-10-23 – 2014-10-25 (×3): 3 [IU] via SUBCUTANEOUS

## 2014-10-23 MED ORDER — INSULIN ASPART 100 UNIT/ML ~~LOC~~ SOLN
0.0000 [IU] | Freq: Three times a day (TID) | SUBCUTANEOUS | Status: DC
  Administered 2014-10-23: 11 [IU] via SUBCUTANEOUS
  Administered 2014-10-23: 15 [IU] via SUBCUTANEOUS
  Administered 2014-10-23: 4 [IU] via SUBCUTANEOUS
  Administered 2014-10-24: 7 [IU] via SUBCUTANEOUS
  Administered 2014-10-24: 11 [IU] via SUBCUTANEOUS
  Administered 2014-10-24: 4 [IU] via SUBCUTANEOUS
  Administered 2014-10-24: 15 [IU] via SUBCUTANEOUS
  Administered 2014-10-25: 11 [IU] via SUBCUTANEOUS
  Administered 2014-10-25: 4 [IU] via SUBCUTANEOUS
  Administered 2014-10-25: 7 [IU] via SUBCUTANEOUS
  Administered 2014-10-26: 11 [IU] via SUBCUTANEOUS
  Administered 2014-10-26: 3 [IU] via SUBCUTANEOUS
  Filled 2014-10-23 (×3): qty 1

## 2014-10-23 MED ORDER — ACETAMINOPHEN 325 MG PO TABS
650.0000 mg | ORAL_TABLET | Freq: Four times a day (QID) | ORAL | Status: DC | PRN
  Administered 2014-10-23 – 2014-10-26 (×3): 650 mg via ORAL
  Filled 2014-10-23 (×3): qty 2

## 2014-10-23 MED ORDER — GABAPENTIN 300 MG PO CAPS
300.0000 mg | ORAL_CAPSULE | Freq: Four times a day (QID) | ORAL | Status: DC
  Administered 2014-10-23 – 2014-10-26 (×14): 300 mg via ORAL
  Filled 2014-10-23 (×10): qty 1
  Filled 2014-10-23: qty 3
  Filled 2014-10-23 (×4): qty 1

## 2014-10-23 MED ORDER — ROPINIROLE HCL 1 MG PO TABS
2.0000 mg | ORAL_TABLET | Freq: Two times a day (BID) | ORAL | Status: DC
  Administered 2014-10-23 – 2014-10-26 (×8): 2 mg via ORAL
  Filled 2014-10-23 (×9): qty 2

## 2014-10-23 MED ORDER — TORSEMIDE 20 MG PO TABS
100.0000 mg | ORAL_TABLET | Freq: Two times a day (BID) | ORAL | Status: DC
  Administered 2014-10-23 – 2014-10-26 (×7): 100 mg via ORAL
  Filled 2014-10-23 (×9): qty 5

## 2014-10-23 MED ORDER — MAGNESIUM OXIDE 400 (241.3 MG) MG PO TABS
400.0000 mg | ORAL_TABLET | Freq: Every day | ORAL | Status: DC
  Administered 2014-10-23 – 2014-10-26 (×4): 400 mg via ORAL
  Filled 2014-10-23 (×4): qty 1

## 2014-10-23 MED ORDER — FENOFIBRATE 160 MG PO TABS
160.0000 mg | ORAL_TABLET | Freq: Every day | ORAL | Status: DC
  Administered 2014-10-23 – 2014-10-26 (×4): 160 mg via ORAL
  Filled 2014-10-23 (×4): qty 1

## 2014-10-23 MED ORDER — OXYCODONE HCL 5 MG PO TABS
5.0000 mg | ORAL_TABLET | Freq: Four times a day (QID) | ORAL | Status: DC | PRN
  Administered 2014-10-23 – 2014-10-25 (×5): 5 mg via ORAL
  Filled 2014-10-23 (×5): qty 1

## 2014-10-23 MED ORDER — ASPIRIN EC 81 MG PO TBEC
81.0000 mg | DELAYED_RELEASE_TABLET | Freq: Every day | ORAL | Status: DC
  Administered 2014-10-23 – 2014-10-26 (×4): 81 mg via ORAL
  Filled 2014-10-23 (×4): qty 1

## 2014-10-23 MED ORDER — DULOXETINE HCL 60 MG PO CPEP
60.0000 mg | ORAL_CAPSULE | Freq: Every day | ORAL | Status: DC
  Administered 2014-10-23 – 2014-10-26 (×4): 60 mg via ORAL
  Filled 2014-10-23 (×4): qty 1

## 2014-10-23 MED ORDER — HYDROXYZINE HCL 10 MG PO TABS
10.0000 mg | ORAL_TABLET | Freq: Three times a day (TID) | ORAL | Status: DC | PRN
  Administered 2014-10-23: 10 mg via ORAL
  Filled 2014-10-23 (×3): qty 1

## 2014-10-23 MED ORDER — PROMETHAZINE HCL 25 MG/ML IJ SOLN
6.2500 mg | Freq: Three times a day (TID) | INTRAMUSCULAR | Status: DC | PRN
  Administered 2014-10-23: 6.25 mg via INTRAVENOUS
  Filled 2014-10-23: qty 1

## 2014-10-23 NOTE — H&P (Signed)
History and Physical    Melissa Myers ZOX:[id number] DOB: Aug 09, 1945 DOA: 10/22/2014  Referring physician: Dr. Vanita Panda PCP: Gwendolyn Grant, MD  Specialists: none  Chief Complaint: UTI  HPI: Melissa Myers is a 69 y.o. female has a past medical history significant for DM, diastolic CHF, morbid obesity, HTN, HLD presents to the ED with chief complaint of a UTI and confusion per family. Patient has been having dysuria for the past few days, she was seen for a regular follow up with CHF clinic and a UA was obtained which showed E coli. She was called and Ciprofloxacin was called in to her pharmacy however it is unclear if she took the antibiotic at home. Patient is confused in the ED, she can contribute some to the story but daughter is bedside. Her daughter tells me that today she became more lethargic, sleepy and confused and felt "hot". Patient denies any chest pain, endorses chronic dyspnea, denies palpitations. She denies abdominal pain, nausea, vomiting of diarrhea. She endorses a 7 lb weight gain in the past 1-2 weeks. She appears confused about her home medications and family is concerned that she cannot manage it on her own.   In the ED, patient is febrile to 102.5, she is normotensive, not tachycardic (on BB), she has a leukocytosis of 18 and elevated lactic acid to 2.78. UA has some evidence of a UTI. TR[facility] asked for admission for sepsis due to UTI. Cultures were obtained and she was started on Vancomycin and Zosyn.   Review of Systems: as per HPI otherwise negative  Past Medical History  Diagnosis Date  . Type II or unspecified type diabetes mellitus without mention of complication, not stated as uncontrolled     insulin dep  . Hyperlipidemia     hx rhabdo on statins  . Hypertension   . Diastolic CHF   . Chronic diarrhea     a/w nausea - felt related to IBS  . RLS (restless legs syndrome)   . Osteoarthritis   . Stasis dermatitis   . GERD (gastroesophageal reflux  disease)   . On home oxygen therapy     uses oxygen 2 liters min per [facility] at night and prn during day  . Anemia   . Neuropathy     feet, toes and fingers  . Disc degeneration, lumbar   . OSA (obstructive sleep apnea)     05/2009 sleep study - refuses CPAP  . Deaf     left side only  . Shortness of breath     chronic   Past Surgical History  Procedure Laterality Date  . Tubal ligation  1980  . Cholecystectomy  1997  . Uterine polyp removal  2008  . Umbilical hernia repair  1995  . Tonsillectomy  1970  . Colonoscopy N/A 12/03/2012    Procedure: COLONOSCOPY;  Surgeon: Lafayette Dragon, MD;  Location: [facility];  Service: Endoscopy;  Laterality: N/A;   Social History:  reports that she has never smoked. She has never used smokeless tobacco. She reports that she does not drink alcohol or use illicit drugs.  Allergies  Allergen Reactions  . Cymbalta [Duloxetine Hcl] Other (See Comments)    Restless leg syndrome  . Statins Other (See Comments)    Statin drugs cause muscle pain / "muscle damage"--was told by MD not to take  . Sulfa Antibiotics Diarrhea  . Levemir [Insulin Detemir] Itching  . Morphine Other (See Comments)    GI upset and headaches  .  Zinc Swelling and Rash    Family History  Problem Relation Age of Onset  . Heart disease Mother   . Heart disease Father   . Heart disease      family history  . Stomach cancer Paternal Grandmother   . Lung cancer Paternal Grandfather   . Colon cancer Neg Hx   . CVA      several aunts  . Heart attack Mother 19  . Heart attack Father 13  . Heart attack      several aunts and an uncle    Prior to Admission medications   Medication Sig Start Date End Date Taking? Authorizing Provider  aspirin EC 81 MG tablet Take 1 tablet (81 mg total) by mouth daily. 07/08/14  Yes Rowe Clack, MD  B Complex-C (B-COMPLEX WITH VITAMIN C) tablet Take 1 tablet by mouth daily.   Yes Historical Provider, MD  carvedilol (COREG) 25 MG tablet  Take 1 tablet (25 mg total) by mouth 2 (two) times daily with a meal. 07/08/14  Yes Rowe Clack, MD  Cholecalciferol (VITAMIN D3) 2000 UNITS capsule Take 2,000 Units by mouth 4 (four) times daily - after meals and at bedtime.    Yes Historical Provider, MD  ciprofloxacin (CIPRO) 250 MG tablet Take 1 tablet (250 mg total) by mouth 2 (two) times daily. 10/21/14  Yes Amy D Clegg, NP  ezetimibe (ZETIA) 10 MG tablet Take 1 tablet (10 mg total) by mouth daily. 07/08/14  Yes Rowe Clack, MD  fenofibrate 160 MG tablet TAKE 1 TABLET BY MOUTH DAILY 09/04/14  Yes Rowe Clack, MD  gabapentin (NEURONTIN) 300 MG capsule Take 1 capsule (300 mg total) by mouth 4 (four) times daily. Patient taking differently: Take 600 mg by mouth 4 (four) times daily.  07/08/14  Yes Rowe Clack, MD  Glucosamine HCl 1000 MG TABS Take 1,000 mg by mouth 2 (two) times daily.   Yes Historical Provider, MD  hydrALAZINE (APRESOLINE) 25 MG tablet Take 1 tablet (25 mg total) by mouth 3 (three) times daily. 06/16/14  Yes Jolaine Artist, MD  hydrOXYzine (ATARAX/VISTARIL) 10 MG tablet Take 10 mg by mouth 3 (three) times daily as needed for anxiety.   Yes Historical Provider, MD  insulin NPH Human (NOVOLIN N) 100 UNIT/ML injection Inject 0.4 mLs (40 Units total) into the skin at bedtime. 07/22/14  Yes Renato Shin, MD  insulin regular (HUMULIN R) 100 units/mL injection 3 times a day (just before each meal) 60-70-80 units Patient taking differently: Inject 60-80 Units into the skin 3 (three) times daily before meals. 3 times a day (just before each meal) 60-70-80 units 07/22/14  Yes Renato Shin, MD  Magnesium 500 MG TABS Take 500 mg by mouth daily.   Yes Historical Provider, MD  omega-3 acid ethyl esters (LOVAZA) 1 G capsule Take 1 g by mouth 2 (two) times daily.    Yes Historical Provider, MD  oxycodone (OXY-IR) 5 MG capsule Take 5 mg by mouth every 6 (six) hours.   Yes Historical Provider, MD  potassium chloride SA  (K-DUR,KLOR-CON) 20 MEQ tablet Take 40 mEq by mouth 2 (two) times daily.   Yes Historical Provider, MD  rOPINIRole (REQUIP) 2 MG tablet Take 2 mg by mouth 2 (two) times daily.   Yes Historical Provider, MD  spironolactone (ALDACTONE) 25 MG tablet Take 1 tablet (25 mg total) by mouth daily. 07/08/14  Yes Rowe Clack, MD  torsemide (DEMADEX) 20 MG tablet Take 80  mg by mouth 2 (two) times daily.   Yes Historical Provider, MD  triamcinolone cream (KENALOG) 0.1 % Apply 1 application topically 2 (two) times daily. Patient taking differently: Apply 1 application topically 2 (two) times daily as needed (for rash).  10/09/14  Yes Golden Circle, FNP  Blood Glucose Monitoring Suppl (ONETOUCH VERIO FLEX SYSTEM) W/DEVICE KIT 1 kit by Does not apply route once. 10/22/14   Renato Shin, MD  DULoxetine (CYMBALTA) 60 MG capsule Take 1 capsule (60 mg total) by mouth daily. 08/07/14   Rowe Clack, MD  glucose blood (ONETOUCH VERIO) test strip Test 3 time a day 10/22/14   Renato Shin, MD  Insulin Syringe-Needle U-100 (B-D INSULIN SYRINGE 1CC/27G) 27G X 1/2" 1 ML MISC Use to inject insulin 4 times daily as advised. 06/02/14   Elayne Snare, MD  metolazone (ZAROXOLYN) 2.5 MG tablet Take 1 tablet (2.5 mg total) by mouth every other day. Patient not taking: Reported on 10/22/2014 07/24/14   Larey Dresser, MD  ONE [facility] LANCETS MISC Use one lancet per blood sugar testing.3 times a day 10/22/14   Renato Shin, MD  torsemide (DEMADEX) 100 MG tablet Take 1 tablet (100 mg total) by mouth 2 (two) times daily. Patient not taking: Reported on 10/20/2014 10/10/14   Larey Dresser, MD   Physical Exam: Filed Vitals:   10/22/14 2233 10/22/14 2245 10/22/14 2300 10/22/14 2330  BP:  148/39 148/40 125/44  Pulse:  78 77 78  Temp:      TempSrc:      Resp:  _0 Height:      Weight: 131.543 kg (290 lb)     SpO2:  99% 99% 99%     GENERAL: NAD, confused  HEENT: head NCAT, no scleral icterus. Pupils round and reactive.  Mucous membranes are dry  NECK: obese  LUNGS: Clear to auscultation. No wheezing or crackles  HEART: Regular rate and rhythm without murmur. 2+ pulses, cannot appreciate JVD, 1-2 peripheral edema  ABDOMEN: Soft, obese, nontender, and nondistended. Positive bowel sounds.  EXTREMITIES: chronic venous stasis changes bilaterally   NEUROLOGIC: Alert and oriented x 2-3. Strength 5/5 in all 4.  SKIN: No ulceration or induration present.   Labs on Admission:  Basic Metabolic Panel:  Recent Labs Lab 10/20/14 1518 10/22/14 2155  NA 136 143  K 4.8 3.8  CL 92* 96*  CO2 34* 34*  GLUCOSE 274* 110*  BUN 41* 34*  CREATININE 1.47* 1.19*  CALCIUM 9.3 9.5   Liver Function Tests:  Recent Labs Lab 10/22/14 2155  AST 33  ALT 21  ALKPHOS 57  BILITOT 0.8  PROT 7.1  ALBUMIN 3.3*   CBC:  Recent Labs Lab 10/22/14 2155  WBC 17.9*  NEUTROABS 15.1*  HGB 11.9*  HCT 37.5  MCV 84.8  PLT 307   ProBNP (last 3 results)  Recent Labs  11/07/13 1139 12/05/13 1700  PROBNP 53.9 52.4   Radiological Exams on Admission: Dg Chest Port 1 View  10/22/2014   CLINICAL DATA:  Shortness of breath, weakness, sepsis  EXAM: PORTABLE CHEST - 1 VIEW  COMPARISON:  08/01/2013  FINDINGS: Moderate enlargement of the cardiac silhouette noted with central vascular congestion. No focal pulmonary opacity. No pleural effusion. Cardiac leads obscure detail.  IMPRESSION: Cardiomegaly with central vascular congestion but no focal acute finding.   Electronically Signed   By: Conchita Paris M.D.   On: 10/22/2014 22:59    EKG: Independently reviewed.  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   Bilateral leg pain   CKD (chronic kidney disease), stage III   Severe sepsis   UTI (urinary tract infection)   Sepsis due to UTI - with fever, decreased mentation,  tachypnea, significant leukocytosis and a source - blood cultures obtained - once defervesce and blood cultures remain negative narrow Abx per antibiogram, for now continue broad spectrum, IV  Acute on chronic diastolic heart failure - concerns for non compliance at home - 7 lb weight gain - she has received 2L NS bolus, hold further fluids for now ,once more awake will allow diet - resume diuretic in am - strict I&Os, daily weights  Chronic hypoxic respiratory failure - continue oxygen - obtain ABG given confusion  CKD III - creatinine at baseline  HTN - hold antihypertensives in the setting of sepsis  DM - resume home regimen, add SSI  Morbid obesity  Diet: NPO Fluids: NS DVT Prophylaxis: Lovenox  Code Status: Full  Family Communication: d/w daughter bedside  Disposition Plan: admit to inpatient   Dainel Arcidiacono M. Cruzita Lederer, MD [facility] Pager [phone]  If 7PM-7AM, please contact night-coverage [email or web address] Password [email or web address] 10/23/2014, 12:17 AM

## 2014-10-23 NOTE — ED Notes (Signed)
Patient returned from CT, admitting MD, now at the bedside.

## 2014-10-23 NOTE — Procedures (Signed)
Pt is sitting in a chair on 3L resting comfortably she refuses the use of CPAP this evening.  RT will place if requested later.

## 2014-10-23 NOTE — Evaluation (Signed)
Physical Therapy Evaluation Patient Details Name: Melissa Myers MRN: [id number] DOB: October 25, 1945 Today's Date: 10/23/2014   History of Present Illness  Melissa Myers is a 69 y.o. female has a past medical history significant for DM, diastolic CHF, morbid obesity, HTN, HLD presents to the ED with chief complaint of a UTI and confusion per family.  Clinical Impression  Pt admitted with the above complications. Pt currently with functional limitations due to the deficits listed below (see PT Problem List). Limited assessment due to lethargy, and pt too fatigued to ambulate at this time. Min assist to min guard for basic bed mobility and transfers. Reports history of Lt knee OA which has been causing functional impairments. Per RN, family expressed concern over their ability to provide enough assistance for pt at home. Pt will benefit from skilled PT to increase their independence and safety with mobility to allow discharge to the venue listed below.       Follow Up Recommendations Home health PT;Supervision for mobility/OOB - HHPT was apparently arranged just prior to this admission and PT had not yet visited patient's home for initial evaluation. Anticipate she will progress functionally as her medical issues resolve however, if she fails to improve or family reports they cannot adequately care for patient we may need to consider arranging a home health aide to assist with ADLs.    Equipment Recommendations  None recommended by PT    Recommendations for Other Services OT consult     Precautions / Restrictions Precautions Precautions: Fall Precaution Comments: monitor O2 Restrictions Weight Bearing Restrictions: No      Mobility  Bed Mobility Overal bed mobility: Needs Assistance Bed Mobility: Rolling;Sidelying to Sit Rolling: Min assist Sidelying to sit: Min assist       General bed mobility comments: Min assist for pt to pull through PTs hand while assisting to roll onto  side and sit up onto edge of bed.  Transfers Overall transfer level: Needs assistance Equipment used: Rolling walker (2 wheeled) Transfers: Sit to/from [facility] Sit to Stand: Min guard Stand pivot transfers: Min guard       General transfer comment: Close guard for safety. VC for hand and walker placement. Requires extra time and effortful for pt to perform but completed this task without physical assist. Slow turns, no buckling noted. Moderately antalgic, resfuses to ambulate at this time.  Ambulation/Gait             General Gait Details: Refused to ambulate due to feeling tired  Stairs            Wheelchair Mobility    Modified Rankin (Stroke Patients Only)       Balance Overall balance assessment: Needs assistance Sitting-balance support: No upper extremity supported;Feet supported Sitting balance-Leahy Scale: Fair     Standing balance support: Bilateral upper extremity supported Standing balance-Leahy Scale: Poor                               Pertinent Vitals/Pain Pain Assessment: 0-10 Pain Score: 9  Pain Location: left knee, history of OA per pt. Pain Descriptors / Indicators: Aching Pain Intervention(s): Monitored during session;Repositioned;Limited activity within patient's tolerance    Home Living Family/patient expects to be discharged to:: Private residence Living Arrangements: Spouse/significant other Available Help at Discharge: Family;Available 24 hours/day (Per pt. Husband available to assist 24/7) Type of Home: House Home Access: Stairs to enter Entrance Stairs-Rails: None Entrance [employer]  of Steps: 1 Home Layout: One level Home Equipment: Bedside commode;Shower seat - built in;Wheelchair Insurance claims handler - 4 wheels Additional Comments: oxygen    Prior Function Level of Independence: Needs assistance   Gait / Transfers Assistance Needed: Independent with rollator  ADL's / Homemaking Assistance  Needed: States husband assists with bath/dress        Hand Dominance   Dominant Hand: Right    Extremity/Trunk Assessment   Upper Extremity Assessment: Defer to OT evaluation           Lower Extremity Assessment: LLE deficits/detail   LLE Deficits / Details: Decreased strength and ROM, history of OA per patient. Limited assessment due to pain     Communication   Communication: No difficulties  Cognition Arousal/Alertness: Lethargic Behavior During Therapy: Anxious Overall Cognitive Status: No family/caregiver present to determine baseline cognitive functioning (Oriented but lethargic)                      General Comments General comments (skin integrity, edema, etc.): O2 sats in mid to upper 90s with 4L supplemental O2 on.    Exercises        Assessment/Plan    PT Assessment Patient needs continued PT services  PT Diagnosis Difficulty walking;Generalized weakness;Altered mental status   PT Problem List Decreased strength;Decreased range of motion;Decreased activity tolerance;Decreased balance;Decreased mobility;Decreased knowledge of use of DME;Cardiopulmonary status limiting activity;Obesity;Pain  PT Treatment Interventions DME instruction;Gait training;Functional mobility training;Therapeutic activities;Stair training;Therapeutic exercise;Balance training;Neuromuscular re-education;Patient/family education;Modalities   PT Goals (Current goals can be found in the Care Plan section) Acute Rehab PT Goals Patient Stated Goal: Go home PT Goal Formulation: With patient Time For Goal Achievement: 11/06/14 Potential to Achieve Goals: Good    Frequency Min 3X/week   Barriers to discharge Decreased caregiver support Unsure of family support. Conflicting stories from pt and RN. Family told RN that they can not provide care. Pt states she is fairly independent and only needs occasional assist from husband.    Co-evaluation               End of Session  Equipment Utilized During Treatment: Gait belt Activity Tolerance: Patient limited by fatigue;Patient limited by lethargy Patient left: in chair;with call bell/phone within reach;with chair alarm set;with nursing/sitter in room Nurse Communication: Mobility status         Time: 0973-5329 PT Time Calculation (min) (ACUTE ONLY): 13 min   Charges:   PT Evaluation $Initial PT Evaluation Tier I: 1 Procedure     PT G CodesEllouise Newer 10/23/2014, 7:04 PM Elayne Snare, [place]

## 2014-10-23 NOTE — ED Notes (Signed)
No repeat lactic acid per admitting MD, next lab check at 0300 for lactic result.

## 2014-10-23 NOTE — Progress Notes (Signed)
Patient temp 102.8, Dr. Baltazar Najjar notified and aware, 650 mg tylenol administered, 5 mg Oxy also administered for complaints of pain, patient restless, confused, and anxious at this time, reoriented as needed, will continue to monitor closely.

## 2014-10-23 NOTE — ED Notes (Signed)
Dr. Shirlean Schlein the bedside, reviewed 2L normal saline bolused, no more fluids at this time.

## 2014-10-23 NOTE — Evaluation (Signed)
Clinical/Bedside Swallow Evaluation Patient Details  Name: Melissa Myers MRN: [id number] Date of Birth: Jan 25, 1946  Today's Date: 10/23/2014 Time: SLP Start Time (ACUTE ONLY): 0830 SLP Stop Time (ACUTE ONLY): 0842 SLP Time Calculation (min) (ACUTE ONLY): 12 min  Past Medical History:  Past Medical History  Diagnosis Date  . Type II or unspecified type diabetes mellitus without mention of complication, not stated as uncontrolled     insulin dep  . Hyperlipidemia     hx rhabdo on statins  . Hypertension   . Diastolic CHF   . Chronic diarrhea     a/w nausea - felt related to IBS  . RLS (restless legs syndrome)   . Osteoarthritis   . Stasis dermatitis   . GERD (gastroesophageal reflux disease)   . On home oxygen therapy     uses oxygen 2 liters min per [employer] at night and prn during day  . Anemia   . Neuropathy     feet, toes and fingers  . Disc degeneration, lumbar   . OSA (obstructive sleep apnea)     05/2009 sleep study - refuses CPAP  . Deaf     left side only  . Shortness of breath     chronic   Past Surgical History:  Past Surgical History  Procedure Laterality Date  . Tubal ligation  1980  . Cholecystectomy  1997  . Uterine polyp removal  2008  . Umbilical hernia repair  1995  . Tonsillectomy  1970  . Colonoscopy N/A 12/03/2012    Procedure: COLONOSCOPY;  Surgeon: Lafayette Dragon, MD;  Location: [facility];  Service: Endoscopy;  Laterality: N/A;   HPI:  69 y.o. female has a past medical history significant for DM, diastolic CHF, morbid obesity, HTN, HLD admitted with chief complaint of a UTI and confusion per family. Per MD note pt endorses a 7 lb weight gain in the past 1-2 weeks. CT No acute intracranial findings. CXR Cardiomegaly with central vascular congestion but no focal acute finding.   Assessment / Plan / Recommendation Clinical Impression  Pt easily aroused for swallow assessment. Observable increased work of breathing at baseline continuing throughout  assessment. No indications of aspiration. Functional mastication however recommend Dys 3 texture due to weakness/fatigue/decreased endurance, thin liquids, meds with liquid, straw sips allowed, small sips. Brief follow up recommended as respiratoy status is most significant issue re: swallowing.         Aspiration Risk   (mild-mod)    Diet Recommendation Dysphagia 3 (Mech soft);Thin   Medication Administration: Whole meds with liquid Compensations: Slow rate;Small sips/bites    Other  Recommendations Oral Care Recommendations: Oral care BID   Follow Up Recommendations       Frequency and Duration min 1 x/week  1 week   Pertinent Vitals/Pain none         Swallow Study          Oral/Motor/Sensory Function Overall Oral Motor/Sensory Function:  (generalized lethargy)   Ice Chips Ice chips: Within functional limits   Thin Liquid Thin Liquid: Within functional limits Presentation: Cup;Straw    Nectar Thick Nectar Thick Liquid: Not tested   Honey Thick Honey Thick Liquid: Not tested   Puree Puree: Within functional limits   Solid   GO    Solid: Within functional limits       Melissa Myers 10/23/2014,8:53 AM  Orbie Pyo Colvin Caroli.Ed [employer] [phone]

## 2014-10-23 NOTE — ED Notes (Signed)
Respiratory therapist called for abg

## 2014-10-23 NOTE — Care Management Note (Signed)
Case Management Note  Patient Details  Name: Melissa Myers MRN: [id number] Date of Birth: 1945/06/18  Subjective/Objective:                 Spoke with patient at bedside, stated that she receives her home oxygen through [facility], and has received HH through them in the past. SHe would like touse them again if she needs it. Patient denies any other CM needs at this time.   Action/Plan:  Will continue to follow and offer resources and [facility] as needed.  Expected Discharge Date:                  Expected Discharge Plan:  [place]  In-House Referral:     Discharge planning Services  CM Consult  Post Acute Care Choice:    Choice offered to:  Patient  DME Arranged:  Oxygen (prior to admission) DME Agency:  [place]:    [facility] Agency:  [place]  Status of Service:  In process, will continue to follow  Medicare Important Message Given:    Date Medicare IM Given:    Medicare IM give by:    Date Additional Medicare IM Given:    Additional Medicare Important Message give by:     If discussed at [place] of Stay Meetings, dates discussed:    Additional Comments:  Carles Collet, RN 10/23/2014, 11:57 AM

## 2014-10-23 NOTE — Progress Notes (Signed)
Patient sitting in chair.drawsy, but oriented x3, fever trending down, continue abx.

## 2014-10-23 NOTE — Progress Notes (Signed)
[facility]  Patient Status: Active (receiving services up to time of hospitalization)  [facility] is providing the following services: RN and PT ( patient was setup with [facility] came to [facility] before being seen we will follow for D/C needs)  If patient discharges after hours, please call [phone].   Consepcion Hearing 10/23/2014, 12:31 PM

## 2014-10-23 NOTE — ED Notes (Signed)
Daughter left contact number: [phone] Lanelle Bal if anything is needed.

## 2014-10-23 NOTE — Progress Notes (Signed)
Nutrition Brief Note  Patient identified on the [place] Report  Wt Readings from Last 15 Encounters:  10/23/14 279 lb 8.7 oz (126.8 kg)  10/20/14 285 lb (129.275 kg)  09/23/14 287 lb (130.182 kg)  09/03/14 276 lb (125.193 kg)  08/21/14 278 lb (126.1 kg)  07/31/14 282 lb 8 oz (128.141 kg)  07/24/14 288 lb 12 oz (130.976 kg)  07/22/14 286 lb (129.729 kg)  07/08/14 274 lb 4 oz (124.399 kg)  06/09/14 277 lb 4 oz (125.76 kg)  04/28/14 282 lb 8 oz (128.141 kg)  04/16/14 268 lb 9.6 oz (121.836 kg)  03/17/14 275 lb 6.4 oz (124.921 kg)  03/06/14 278 lb (126.1 kg)  02/05/14 268 lb (121.564 kg)   Melissa Myers is a 69 y.o. female has a past medical history significant for DM, diastolic CHF, morbid obesity, HTN, HLD presents to the ED with chief complaint of a UTI and confusion per family. Patient has been having dysuria for the past few days, she was seen for a regular follow up with CHF clinic and a UA was obtained which showed E coli. She was called and Ciprofloxacin was called in to her pharmacy however it is unclear if she took the antibiotic at home. Patient is confused in the ED, she can contribute some to the story but daughter is bedside. Her daughter tells me that today she became more lethargic, sleepy and confused and felt "hot". Patient denies any chest pain, endorses chronic dyspnea, denies palpitations. She denies abdominal pain, nausea, vomiting of diarrhea. She endorses a 7 lb weight gain in the past 1-2 weeks. She appears confused about her home medications and family is concerned that she cannot manage it on her own.   Pt was drowsy at time of visit. She denies any difficulty chewing or swallowing foods. She reveals she had a good appetite PTA and denies any recent wt loss.   Breakfast tray in front of pt at time of visit; pt consumed 50% of grits and 25% of milk. Pt reported "I only wanted the liquids". Suspect intake will increase once mentation improves.    Nutrition-Focused physical exam completed. Findings are no fat depletion, no muscle depletion, and moderate edema.   Body mass index is 40.11 kg/(m^2). Patient meets criteria for extreme obesity, class III based on current BMI.   Current diet order is Dysphagia 3, patient is consuming approximately n/a% of meals at this time. Labs and medications reviewed.   No nutrition interventions warranted at this time. If nutrition issues arise, please consult RD.   Solomon Skowronek A. Jimmye Norman, RD, LDN, CDE Pager: [phone] After hours Pager: [phone]

## 2014-10-24 ENCOUNTER — Other Ambulatory Visit (HOSPITAL_COMMUNITY): Payer: Self-pay | Admitting: Internal Medicine

## 2014-10-24 ENCOUNTER — Ambulatory Visit: Payer: Medicare Other | Admitting: Endocrinology

## 2014-10-24 DIAGNOSIS — I5032 Chronic diastolic (congestive) heart failure: Secondary | ICD-10-CM

## 2014-10-24 DIAGNOSIS — N390 Urinary tract infection, site not specified: Secondary | ICD-10-CM

## 2014-10-24 DIAGNOSIS — A419 Sepsis, unspecified organism: Principal | ICD-10-CM

## 2014-10-24 LAB — CBC
HCT: 31.6 % — ABNORMAL LOW (ref 36.0–46.0)
Hemoglobin: 9.5 g/dL — ABNORMAL LOW (ref 12.0–15.0)
MCH: 26 pg (ref 26.0–34.0)
MCHC: 30.1 g/dL (ref 30.0–36.0)
MCV: 86.6 fL (ref 78.0–100.0)
PLATELETS: 268 10*3/uL (ref 150–400)
RBC: 3.65 MIL/uL — ABNORMAL LOW (ref 3.87–5.11)
RDW: 16.2 % — AB (ref 11.5–15.5)
WBC: 7.6 10*3/uL (ref 4.0–10.5)

## 2014-10-24 LAB — GLUCOSE, CAPILLARY
GLUCOSE-CAPILLARY: 243 mg/dL — AB (ref 65–99)
GLUCOSE-CAPILLARY: 311 mg/dL — AB (ref 65–99)
Glucose-Capillary: 197 mg/dL — ABNORMAL HIGH (ref 65–99)
Glucose-Capillary: 272 mg/dL — ABNORMAL HIGH (ref 65–99)

## 2014-10-24 LAB — HEMOGLOBIN A1C
HEMOGLOBIN A1C: 8.7 % — AB (ref 4.8–5.6)
Mean Plasma Glucose: 203 mg/dL

## 2014-10-24 LAB — BASIC METABOLIC PANEL
Anion gap: 8 (ref 5–15)
BUN: 21 mg/dL — AB (ref 6–20)
CHLORIDE: 97 mmol/L — AB (ref 101–111)
CO2: 37 mmol/L — ABNORMAL HIGH (ref 22–32)
CREATININE: 1.15 mg/dL — AB (ref 0.44–1.00)
Calcium: 8.4 mg/dL — ABNORMAL LOW (ref 8.9–10.3)
GFR calc Af Amer: 55 mL/min — ABNORMAL LOW (ref >60)
GFR calc non Af Amer: 47 mL/min — ABNORMAL LOW (ref >60)
Glucose, Bld: 231 mg/dL — ABNORMAL HIGH (ref 65–99)
Potassium: 3 mmol/L — ABNORMAL LOW (ref 3.5–5.1)
SODIUM: 142 mmol/L (ref 135–145)

## 2014-10-24 LAB — MAGNESIUM: Magnesium: 2 mg/dL (ref 1.7–2.4)

## 2014-10-24 MED ORDER — CIPROFLOXACIN IN D5W 200 MG/100ML IV SOLN
200.0000 mg | Freq: Two times a day (BID) | INTRAVENOUS | Status: DC
  Filled 2014-10-24: qty 100

## 2014-10-24 MED ORDER — SENNOSIDES-DOCUSATE SODIUM 8.6-50 MG PO TABS
2.0000 | ORAL_TABLET | Freq: Two times a day (BID) | ORAL | Status: DC
  Administered 2014-10-24 – 2014-10-25 (×3): 2 via ORAL
  Filled 2014-10-24 (×5): qty 2

## 2014-10-24 MED ORDER — POTASSIUM CHLORIDE CRYS ER 20 MEQ PO TBCR
40.0000 meq | EXTENDED_RELEASE_TABLET | Freq: Once | ORAL | Status: AC
  Administered 2014-10-24: 40 meq via ORAL
  Filled 2014-10-24: qty 2

## 2014-10-24 MED ORDER — IPRATROPIUM-ALBUTEROL 0.5-2.5 (3) MG/3ML IN SOLN: 3.0000 mL | Freq: Four times a day (QID) | RESPIRATORY_TRACT | Status: DC | PRN

## 2014-10-24 MED ORDER — POLYETHYLENE GLYCOL 3350 17 G PO PACK
17.0000 g | PACK | Freq: Every day | ORAL | Status: DC
  Administered 2014-10-24: 17 g via ORAL
  Filled 2014-10-24 (×3): qty 1

## 2014-10-24 MED ORDER — DEXTROSE 5 % IV SOLN
1.0000 g | INTRAVENOUS | Status: DC
  Administered 2014-10-24: 1 g via INTRAVENOUS
  Filled 2014-10-24: qty 10

## 2014-10-24 MED ORDER — FLORANEX PO PACK
1.0000 g | PACK | Freq: Three times a day (TID) | ORAL | Status: DC
  Administered 2014-10-24 – 2014-10-26 (×6): 1 g via ORAL
  Filled 2014-10-24 (×10): qty 1

## 2014-10-24 MED ORDER — CIPROFLOXACIN IN D5W 400 MG/200ML IV SOLN: 400.0000 mg | Freq: Two times a day (BID) | INTRAVENOUS | Status: DC

## 2014-10-24 MED ORDER — CIPROFLOXACIN HCL 500 MG PO TABS
250.0000 mg | ORAL_TABLET | Freq: Two times a day (BID) | ORAL | Status: DC
  Administered 2014-10-24 – 2014-10-26 (×4): 250 mg via ORAL
  Filled 2014-10-24 (×4): qty 1

## 2014-10-24 NOTE — Progress Notes (Addendum)
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   Bilateral leg pain   CKD (chronic kidney disease), stage III   Severe sepsis   UTI (urinary tract infection)   Sepsis  Sepsis due to UTI - with fever, decreased mentation, metabolic encephalopathy, tachypnea, significant leukocytosis and a source on presentation - blood cultures obtained - fever subsided, wbc normalized, narrow abx to rocephin, awaiting final culture result (urine culture from 8/30 + ecoli ) Addendum: reported severe burning with rocephin infusion, this is d/ced, changed to oral cipro per pharmacy dosing  Acute on chronic diastolic heart failure - concerns for non compliance at home - 7 lb weight gain - she has received 2L NS bolus,  - resume diuretic on 9/1 - strict I&Os, daily weights - d/c vanc/zosyn on 9/2 to limit fluids, does has wheezing on 9/2  Chronic hypoxic respiratory failure - continue oxygen - obtain ABG given confusion  CKD III - creatinine at baseline  HTN - hold antihypertensives in the setting of sepsis  DM - resume home regimen, add SSI  H/o choking: swallow eval pending, reported is under evaluation by oral surgeon , was recommended to have surgery, ? Zenker's diverticulum.  Morbid obesity, life style modification  Code Status: full , confirmed with patient and family  Family Communication: patient and son  Disposition Plan: remain in the hospital   Consultants:  none  Procedures:  none  Antibiotics: Vanc/zosyn from 9/1 to 9/2 Rocephin from 9/2  Objective: BP 139/53 mmHg  Pulse 62  Temp(Src)  98.4 F (36.9 C) (Oral)  Resp 18  Ht 5\' 10"  (1.778 m)  Wt 279 lb 8.7 oz (126.8 kg)  BMI 40.11 kg/m2  SpO2 97%  Intake/Output Summary (Last 24 hours) at 10/24/14 0934 Last data filed at 10/24/14 0756  Gross per 24 hour  Intake    720 ml  Output   1302 ml  Net   -582 ml   Filed Weights   10/22/14 2233 10/23/14 0250  Weight: 290 lb (131.543 kg) 279 lb 8.7 oz (126.8 kg)    Exam:   General:  NAD, more alert  Cardiovascular: RRR  Respiratory: CTABL, upper airway wheezing  Abdomen: Soft/ND/NT, positive BS  Musculoskeletal: pitting Edema, chronic venous stasis changes, tender, no erythema  Neuro: aaox3  Data Reviewed: Basic Metabolic Panel:  Recent Labs Lab 10/20/14 1518 10/22/14 2155 10/23/14 0330 10/24/14 0629  NA 136 143 141 142  K 4.8 3.8 3.3* 3.0*  CL 92* 96* 98* 97*  CO2 34* 34* 30 37*  GLUCOSE 274* 110* 161* 231*  BUN 41* 34* 29* 21*  CREATININE 1.47* 1.19* 1.20* 1.15*  CALCIUM 9.3 9.5 8.3* 8.4*   Liver Function Tests:  Recent Labs Lab 10/22/14 2155  AST 33  ALT 21  ALKPHOS 57  BILITOT 0.8  PROT 7.1  ALBUMIN 3.3*   No results for input(s): LIPASE, AMYLASE in the last 168 hours. No results for input(s): AMMONIA in the last 168 hours. CBC:  Recent Labs Lab 10/22/14 2155 10/23/14 0330 10/24/14 0629  WBC 17.9* 14.6* 7.6  NEUTROABS 15.1*  --   --   HGB 11.9*  10.3* 9.5*  HCT 37.5 33.8* 31.6*  MCV 84.8 84.9 86.6  PLT 307 279 268   Cardiac Enzymes:   No results for input(s): CKTOTAL, CKMB, CKMBINDEX, TROPONINI in the last 168 hours. BNP (last 3 results) No results for input(s): BNP in the last 8760 hours.  ProBNP (last 3 results)  Recent Labs  11/07/13 1139 12/05/13 1700  PROBNP 53.9 52.4    CBG:  Recent Labs Lab 10/23/14 0809 10/23/14 1133 10/23/14 1640 10/23/14 2148 10/24/14 0823  GLUCAP 187* 278* 323* 267* 197*    Recent Results (from the past 240 hour(s))  Urine culture     Status: None   Collection Time: 10/20/14   3:20 PM  Result Value Ref Range Status   Specimen Description URINE, RANDOM  Final   Special Requests NONE  Final   Culture >=100,000 COLONIES/mL ESCHERICHIA COLI  Final   Report Status 10/22/2014 FINAL  Final   Organism ID, Bacteria ESCHERICHIA COLI  Final      Susceptibility   Escherichia coli - MIC*    AMPICILLIN 4 SENSITIVE Sensitive     CEFAZOLIN <=4 SENSITIVE Sensitive     CEFTRIAXONE <=1 SENSITIVE Sensitive     CIPROFLOXACIN <=0.25 SENSITIVE Sensitive     GENTAMICIN <=1 SENSITIVE Sensitive     IMIPENEM <=0.25 SENSITIVE Sensitive     NITROFURANTOIN <=16 SENSITIVE Sensitive     TRIMETH/SULFA <=20 SENSITIVE Sensitive     AMPICILLIN/SULBACTAM 4 SENSITIVE Sensitive     PIP/TAZO <=4 SENSITIVE Sensitive     * >=100,000 COLONIES/mL ESCHERICHIA COLI  MRSA PCR Screening     Status: None   Collection Time: 10/23/14 10:48 AM  Result Value Ref Range Status   MRSA by PCR NEGATIVE NEGATIVE Final    Comment:        The GeneXpert MRSA Assay (FDA approved for NASAL specimens only), is one component of a comprehensive MRSA colonization surveillance program. It is not intended to diagnose MRSA infection nor to guide or monitor treatment for MRSA infections.      Studies: No results found.  Scheduled Meds: . aspirin EC  81 mg Oral Daily  . cefTRIAXone (ROCEPHIN)  IV  1 g Intravenous Q24H  . DULoxetine  60 mg Oral Daily  . enoxaparin (LOVENOX) injection  60 mg Subcutaneous Q24H  . ezetimibe  10 mg Oral Daily  . fenofibrate  160 mg Oral Daily  . gabapentin  300 mg Oral QID  . insulin aspart  0-20 Units Subcutaneous TID WC  . insulin aspart  0-5 Units Subcutaneous QHS  . insulin NPH Human  40 Units Subcutaneous QHS  . magnesium oxide  400 mg Oral Daily  . polyethylene glycol  17 g Oral Daily  . rOPINIRole  2 mg Oral BID  . senna-docusate  2 tablet Oral BID  . sodium chloride  3 mL Intravenous Q12H  . torsemide  100 mg Oral BID    Continuous Infusions:    Time spent:  42mins  Melissa Santini MD, PhD  [facility] Pager [phone]. If 7PM-7AM, please contact night-coverage at [email or web address], password [facility] 10/24/2014, 9:34 AM  LOS: 1 day

## 2014-10-24 NOTE — Progress Notes (Signed)
Inpatient Diabetes Program Recommendations  AACE/ADA: New Consensus Statement on Inpatient Glycemic Control (2013)  Target Ranges:  Prepandial:   less than 140 mg/dL      Peak postprandial:   less than 180 mg/dL (1-2 hours)      Critically ill patients:  140 - 180 mg/dL    Results for Melissa, Myers (MRN [id number]) as of 10/24/2014 13:41  Ref. Range 10/23/2014 08:09 10/23/2014 11:33 10/23/2014 16:40 10/23/2014 21:48  Glucose-Capillary Latest Ref Range: 65-99 mg/dL 187 (H) 278 (H) 323 (H) 267 (H)    Results for Melissa, Myers (MRN [id number]) as of 10/24/2014 13:41  Ref. Range 10/24/2014 08:23 10/24/2014 12:32  Glucose-Capillary Latest Ref Range: 65-99 mg/dL 197 (H) 243 (H)     Current DM Orders: NPH insulin- 40 units QHS            Novolog Resistant SSI (0-20 units) TID AC + HS    -Poor PO intake noted (0-20% at best so far).  -Fasting and postprandial glucose levels elevated.   MD- Please consider the following in-hospital insulin adjustments:  1. Increase bedtime NPH insulin to 45 units QHS  2. Add morning dose of NPH insulin- NPH 10 units QAM with breakfast     Will follow [name] RN, MSN, CDE Diabetes Coordinator Inpatient Glycemic Control Team Team Pager: [phone] (8a-5p)

## 2014-10-24 NOTE — Progress Notes (Signed)
Physical Therapy Treatment Patient Details Name: Melissa Myers MRN: [id number] DOB: [birth year] Today's Date: 10/24/2014    History of Present Illness Melissa Myers is a 69 y.o. female has a past medical history significant for DM, diastolic CHF, morbid obesity, HTN, HLD presents to the ED with chief complaint of a UTI and confusion per family.    PT Comments    Pt took several steps with RW from bedside commode to recliner with RW. 10/10 B knee pain limits standing tolerance. Pain medication requested. Assisted pt with pericare following toileting.  Follow Up Recommendations  Home health PT;Supervision for mobility/OOB     Equipment Recommendations  None recommended by PT    Recommendations for Other Services OT consult     Precautions / Restrictions Precautions Precautions: Fall Precaution Comments: monitor O2 Restrictions Weight Bearing Restrictions: No    Mobility  Bed Mobility Overal bed mobility: Modified Independent Bed Mobility: Supine to Sit     Supine to sit: Modified independent (Device/Increase time)     General bed mobility comments: HOB up 30*, used rail  Transfers Overall transfer level: Needs assistance Equipment used: Rolling walker (2 wheeled) Transfers: Sit to/from [facility] Sit to Stand: Min guard Stand pivot transfers: Min guard       General transfer comment: Close guard for safety. VC for hand and walker placement. Requires extra time and effortful for pt to perform but completed this task without physical assist. Slow turns, no buckling noted. B knee pain limiting tolerance to standing.   Ambulation/Gait Ambulation/Gait assistance: Min guard Ambulation Distance (Feet): 3 Feet Assistive device: Rolling walker (2 wheeled) Gait Pattern/deviations: Step-to pattern     General Gait Details: distance limited by pain, took several pivotal steps from [facility] to recliner, no physical assist required, min/guard for  safety; SaO2 95% on 3L O2 during activity   Stairs            Wheelchair Mobility    Modified Rankin (Stroke Patients Only)       Balance     Sitting balance-Leahy Scale: Fair       Standing balance-Leahy Scale: Poor                      Cognition Arousal/Alertness: Awake/alert Behavior During Therapy: WFL for tasks assessed/performed Overall Cognitive Status: Within Functional Limits for tasks assessed                      Exercises General Exercises - Lower Extremity Long Arc Quad:  (encouraged pt to attempt long arc quads after she receives pain medicine )    General Comments        Pertinent Vitals/Pain Pain Score: 10-Worst pain ever Pain Location: B knees, h/o OA per pt Pain Descriptors / Indicators: Sore Pain Intervention(s): Monitored during session;Limited activity within patient's tolerance;Patient requesting pain meds-RN notified    Home Living                      Prior Function            PT Goals (current goals can now be found in the care plan section) Acute Rehab PT Goals Patient Stated Goal: Go home PT Goal Formulation: With patient Time For Goal Achievement: 11/06/14 Potential to Achieve Goals: Good Progress towards PT goals: Progressing toward goals    Frequency  Min 3X/week    PT Plan Current plan remains appropriate    Co-evaluation  End of Session Equipment Utilized During Treatment: Gait belt Activity Tolerance: No increased pain Patient left: in chair;with call bell/phone within reach;with chair alarm set     Time: 1000-1031 PT Time Calculation (min) (ACUTE ONLY): 31 min  Charges:  $Therapeutic Activity: 23-37 mins                    G Codes:      Melissa Myers 10/24/2014, 10:37 AM [phone]

## 2014-10-24 NOTE — Progress Notes (Addendum)
ANTIBIOTIC CONSULT NOTE - INITIAL  Pharmacy Consult for cipro Indication:   Allergies  Allergen Reactions  . Cymbalta [Duloxetine Hcl] Other (See Comments)    Restless leg syndrome  . Statins Other (See Comments)    Statin drugs cause muscle pain / "muscle damage"--was told by MD not to take  . Sulfa Antibiotics Diarrhea  . Levemir [Insulin Detemir] Itching  . Morphine Other (See Comments)    GI upset and headaches  . Zinc Swelling and Rash    Patient Measurements: Height: 5\' 10"  (177.8 cm) Weight: 279 lb 8.7 oz (126.8 kg) IBW/kg (Calculated) : 68.5  Vital Signs: Temp: 98.4 F (36.9 C) (09/02 0556) Temp Source: Oral (09/02 0556) BP: 139/53 mmHg (09/02 0556) Pulse Rate: 62 (09/02 0556) Intake/Output from previous day: 09/01 0701 - 09/02 0700 In: 770 [P.O.:720; IV Piggyback:50] Out: 8527 [Urine:1001; Stool:1] Intake/Output from this shift: Total I/O In: 360 [P.O.:360] Out: 551 [Urine:551]  Labs:  Recent Labs  10/22/14 2155 10/23/14 0330 10/24/14 0629  WBC 17.9* 14.6* 7.6  HGB 11.9* 10.3* 9.5*  PLT 307 279 268  CREATININE 1.19* 1.20* 1.15*   Estimated Creatinine Clearance: 66.9 mL/min (by C-G formula based on Cr of 1.15). No results for input(s): VANCOTROUGH, VANCOPEAK, VANCORANDOM, GENTTROUGH, GENTPEAK, GENTRANDOM, TOBRATROUGH, TOBRAPEAK, TOBRARND, AMIKACINPEAK, AMIKACINTROU, AMIKACIN in the last 72 hours.   Microbiology: Recent Results (from the past 720 hour(s))  Urine culture     Status: None   Collection Time: 10/20/14  3:20 PM  Result Value Ref Range Status   Specimen Description URINE, RANDOM  Final   Special Requests NONE  Final   Culture >=100,000 COLONIES/mL ESCHERICHIA COLI  Final   Report Status 10/22/2014 FINAL  Final   Organism ID, Bacteria ESCHERICHIA COLI  Final      Susceptibility   Escherichia coli - MIC*    AMPICILLIN 4 SENSITIVE Sensitive     CEFAZOLIN <=4 SENSITIVE Sensitive     CEFTRIAXONE <=1 SENSITIVE Sensitive     CIPROFLOXACIN  <=0.25 SENSITIVE Sensitive     GENTAMICIN <=1 SENSITIVE Sensitive     IMIPENEM <=0.25 SENSITIVE Sensitive     NITROFURANTOIN <=16 SENSITIVE Sensitive     TRIMETH/SULFA <=20 SENSITIVE Sensitive     AMPICILLIN/SULBACTAM 4 SENSITIVE Sensitive     PIP/TAZO <=4 SENSITIVE Sensitive     * >=100,000 COLONIES/mL ESCHERICHIA COLI  MRSA PCR Screening     Status: None   Collection Time: 10/23/14 10:48 AM  Result Value Ref Range Status   MRSA by PCR NEGATIVE NEGATIVE Final    Comment:        The GeneXpert MRSA Assay (FDA approved for NASAL specimens only), is one component of a comprehensive MRSA colonization surveillance program. It is not intended to diagnose MRSA infection nor to guide or monitor treatment for MRSA infections.     Medical History: Past Medical History  Diagnosis Date  . Type II or unspecified type diabetes mellitus without mention of complication, not stated as uncontrolled     insulin dep  . Hyperlipidemia     hx rhabdo on statins  . Hypertension   . Diastolic CHF   . Chronic diarrhea     a/w nausea - felt related to IBS  . RLS (restless legs syndrome)   . Osteoarthritis   . Stasis dermatitis   . GERD (gastroesophageal reflux disease)   . On home oxygen therapy     uses oxygen 2 liters min per [place] at night and prn during day  .  Anemia   . Neuropathy     feet, toes and fingers  . Disc degeneration, lumbar   . OSA (obstructive sleep apnea)     05/2009 sleep study - refuses CPAP  . Deaf     left side only  . Shortness of breath     chronic    Assessment: 52 yof who presented wth sepsis/UTI source. Pharmacy consulted to dose cipro orally for [facility] UTI. Patient was started on CTX this AM but infusion was stopped due to severe burning in arm. Afebrile, WBC wnl.  Vancomycin 8/31>9/2 Zosyn 8/31>9/2 CTX 9/2 x 1 dose - d/c'd with severe burning on giving infusion Cipro po>>  BC x 2 8/31> UC 8/30>>Ecoli (pan-S)   Goal of Therapy:  Eradication of  infection  Plan:  Cipro 250mg  po bid Monitor renal function, c/s, clinical progress, abx LOT  Elicia Lamp, PharmD Clinical Pharmacist Pager [phone] 10/24/2014 1:07 PM

## 2014-10-24 NOTE — Consult Note (Addendum)
WOC wound consult note Reason for Consult: Consult requested for bilat legs.  Pt has few scattered patchy areas of dry scabs, no open wounds or drainage.  She states she uses topical cream for these areas prior to admission but is unable to state the name.  EMR indicates that she was using triamcinolone cream (KENALOG) 0.1 %for legs prior to admission BID.  Please order if desired. Please re-consult if further assistance is needed.  Thank-you,  Julien Girt MSN, [place], [place], [place], [place]

## 2014-10-24 NOTE — Progress Notes (Signed)
Pt complained of severe burning to arm with IV rocephin. Stopped IV medication and notified Florencia Reasons, MD with findings.

## 2014-10-24 NOTE — Progress Notes (Signed)
Speech Language Pathology Treatment: Dysphagia  Patient Details Name: Melissa Myers MRN: [id number] DOB: March 15, 1945 Today's Date: 10/24/2014 Time: 8676-7209 SLP Time Calculation (min) (ACUTE ONLY): 25 min  Assessment / Plan / Recommendation Clinical Impression  Patient observed with P.O.'s of thin liquids via straw sips, but declined solid textures. She stated that she did not have much difficulty with breakfast meal, but c/o more of the "salt". Patient's daughter present and she and patient both stated that patient has been eating a mechanical soft solid diet for past 3-4 months, and has had difficulty with meats primarily. SLP educated and provided daughter with mechanical soft solids for home use, and educated daughter and patient on swallow safety (taking breaks, don't eat if tired, etc). Patient is on oxygen and eyes will get heavy and close periodically, which daughter says is her norm.   HPI Other Pertinent Information: 69 y.o. female has a past medical history significant for DM, diastolic CHF, morbid obesity, HTN, HLD admitted with chief complaint of a UTI and confusion per family. Per MD note pt endorses a 7 lb weight gain in the past 1-2 weeks. CT No acute intracranial findings. CXR Cardiomegaly with central vascular congestion but no focal acute finding.   Pertinent Vitals Pain Assessment: No/denies pain  SLP Plan  Discharge SLP treatment due to (comment) (patient is on least restrictive diet and has been eating what is basically a mechanical soft solid diet at home.)    Recommendations Diet recommendations: Dysphagia 3 (mechanical soft);Thin liquid Liquids provided via: Straw;Cup Medication Administration: Whole meds with liquid Supervision: Patient able to self feed Compensations: Slow rate;Small sips/bites Postural Changes and/or Swallow Maneuvers: Seated upright 90 degrees;Upright 30-60 min after meal              Oral Care Recommendations: Oral care BID Follow up  Recommendations: None Plan: Discharge SLP treatment due to (comment) (patient is on least restrictive diet and has been eating what is basically a mechanical soft solid diet at home.)    GO     Dannial Monarch 10/24/2014, 3:30 PM  Sonia Baller, [place], [place] 10/24/2014 3:33 PM Phone: [phone] Fax: [phone]

## 2014-10-24 NOTE — Progress Notes (Signed)
Patient is resting comfortably on 3L  she refuses the use of CPAP this evening. RT will place if requested later.

## 2014-10-25 DIAGNOSIS — G9341 Metabolic encephalopathy: Secondary | ICD-10-CM

## 2014-10-25 DIAGNOSIS — I10 Essential (primary) hypertension: Secondary | ICD-10-CM

## 2014-10-25 DIAGNOSIS — M79604 Pain in right leg: Secondary | ICD-10-CM

## 2014-10-25 DIAGNOSIS — M79605 Pain in left leg: Secondary | ICD-10-CM

## 2014-10-25 DIAGNOSIS — R6520 Severe sepsis without septic shock: Secondary | ICD-10-CM

## 2014-10-25 LAB — GLUCOSE, CAPILLARY
GLUCOSE-CAPILLARY: 323 mg/dL — AB (ref 65–99)
GLUCOSE-CAPILLARY: 192 mg/dL — AB (ref 65–99)
GLUCOSE-CAPILLARY: 152 mg/dL — AB (ref 65–99)
GLUCOSE-CAPILLARY: 239 mg/dL — AB (ref 65–99)
GLUCOSE-CAPILLARY: 287 mg/dL — AB (ref 65–99)
Glucose-Capillary: 195 mg/dL — ABNORMAL HIGH (ref 65–99)
Glucose-Capillary: 311 mg/dL — ABNORMAL HIGH (ref 65–99)

## 2014-10-25 LAB — BASIC METABOLIC PANEL
ANION GAP: 12 (ref 5–15)
BUN: 15 mg/dL (ref 6–20)
CHLORIDE: 93 mmol/L — AB (ref 101–111)
CO2: 36 mmol/L — ABNORMAL HIGH (ref 22–32)
Calcium: 8.6 mg/dL — ABNORMAL LOW (ref 8.9–10.3)
Creatinine, Ser: 1.07 mg/dL — ABNORMAL HIGH (ref 0.44–1.00)
GFR calc Af Amer: 60 mL/min — ABNORMAL LOW (ref >60)
GFR, EST NON AFRICAN AMERICAN: 52 mL/min — AB (ref >60)
GLUCOSE: 185 mg/dL — AB (ref 65–99)
POTASSIUM: 3.4 mmol/L — AB (ref 3.5–5.1)
Sodium: 141 mmol/L (ref 135–145)

## 2014-10-25 LAB — CBC
HEMATOCRIT: 32.7 % — AB (ref 36.0–46.0)
HEMOGLOBIN: 10.5 g/dL — AB (ref 12.0–15.0)
MCH: 27.6 pg (ref 26.0–34.0)
MCHC: 32.1 g/dL (ref 30.0–36.0)
MCV: 85.8 fL (ref 78.0–100.0)
PLATELETS: 303 10*3/uL (ref 150–400)
RBC: 3.81 MIL/uL — AB (ref 3.87–5.11)
RDW: 16 % — ABNORMAL HIGH (ref 11.5–15.5)
WBC: 10.6 10*3/uL — AB (ref 4.0–10.5)

## 2014-10-25 LAB — MAGNESIUM: Magnesium: 1.9 mg/dL (ref 1.7–2.4)

## 2014-10-25 MED ORDER — CARVEDILOL 3.125 MG PO TABS
3.1250 mg | ORAL_TABLET | Freq: Two times a day (BID) | ORAL | Status: DC
  Administered 2014-10-25: 3.125 mg via ORAL
  Filled 2014-10-25 (×2): qty 1

## 2014-10-25 MED ORDER — DICLOFENAC SODIUM 1 % TD GEL
2.0000 g | Freq: Every day | TRANSDERMAL | Status: DC | PRN
  Administered 2014-10-26: 2 g via TOPICAL
  Filled 2014-10-25 (×2): qty 100

## 2014-10-25 MED ORDER — POTASSIUM CHLORIDE CRYS ER 20 MEQ PO TBCR
40.0000 meq | EXTENDED_RELEASE_TABLET | Freq: Once | ORAL | Status: AC
  Administered 2014-10-25: 40 meq via ORAL
  Filled 2014-10-25: qty 2

## 2014-10-25 NOTE — Progress Notes (Signed)
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   Bilateral leg pain   CKD (chronic kidney disease), stage III   Severe sepsis   UTI (urinary tract infection)   Sepsis  Sepsis due to UTI - with fever, decreased mentation, metabolic encephalopathy, tachypnea, significant leukocytosis and a source on presentation - blood cultures no growth - fever subsided, wbc normalized, narrow abx to rocephin, awaiting final culture result (urine culture from 8/30 + ecoli ) Addendum: reported severe burning with rocephin infusion, this is d/ced, changed to oral cipro per pharmacy dosing -continue oral cipro  Acute on chronic diastolic heart failure - concerns for non compliance at home - 7 lb weight gain - she has received 2L NS bolus in the ED due to sepsis - resume diuretic on 9/1, per med rec patient on metolazone 2.5 every other day and torsemide 100mg  po bid, not sure if patient is compliant with this regimen - strict I&Os, daily weights - d/c vanc/zosyn ,change to oral abx on 9/2 to limit fluids, , had wheezing on 9/2 -wheezing resolved on 9/3, lower extremity subsided. Continue home diuretics  Chronic hypoxic respiratory failure - on home  oxygen - initial abg showed chronic mild co2 retention with normal PH.  CKD III - creatinine at baseline  HTN - coreg held initially in the setting of sepsis  -9/3, restart coreg, titrate, monitor heart rate  DM - resume home regimen, add SSI  H/o choking:  reported is under evaluation by oral surgeon , was recommended to have surgery, ? Zenker's diverticulum. Soft diet, thin  liquid per speech  Morbid obesity, life style modification  Code Status: full , confirmed with patient and family  Family Communication: patient and multiple family member in room  Disposition Plan: anticipate discharge to home with home health on 9/4   Consultants:  none  Procedures:  none  Antibiotics: Vanc/zosyn from 9/1 to 9/2 Rocephinx1 from 9/2 cipro from 9/2  Objective: BP 140/48 mmHg  Pulse 64  Temp(Src) 98.8 F (37.1 C) (Oral)  Resp 18  Ht 5\' 10"  (1.778 m)  Wt 272 lb 4.3 oz (123.5 kg)  BMI 39.07 kg/m2  SpO2 95%  Intake/Output Summary (Last 24 hours) at 10/25/14 1254 Last data filed at 10/25/14 1200  Gross per 24 hour  Intake    600 ml  Output   5200 ml  Net  -4600 ml   Filed Weights   10/22/14 2233 10/23/14 0250 10/25/14 0510  Weight: 290 lb (131.543 kg) 279 lb 8.7 oz (126.8 kg) 272 lb 4.3 oz (123.5 kg)    Exam:   General:  NAD, full alert  Cardiovascular: RRR  Respiratory: CTABL, no wheezing  Abdomen: Soft/ND/NT, positive BS  Musculoskeletal: pitting Edema resolving, chronic venous stasis changes, tender, no erythema  Neuro: aaox3  Data Reviewed: Basic Metabolic Panel:  Recent Labs Lab 10/20/14 1518 10/22/14 2155 10/23/14 0330 10/24/14 0629 10/25/14 0440  NA 136 143 141 142 141  K 4.8 3.8 3.3* 3.0* 3.4*  CL 92* 96* 98* 97* 93*  CO2 34* 34* 30 37* 36*  GLUCOSE 274* 110* 161* 231* 185*  BUN 41* 34* 29*  21* 15  CREATININE 1.47* 1.19* 1.20* 1.15* 1.07*  CALCIUM 9.3 9.5 8.3* 8.4* 8.6*  MG  --   --   --  2.0 1.9   Liver Function Tests:  Recent Labs Lab 10/22/14 2155  AST 33  ALT 21  ALKPHOS 57  BILITOT 0.8  PROT 7.1  ALBUMIN 3.3*   No results for input(s): LIPASE, AMYLASE in the last 168 hours. No results for input(s): AMMONIA in the last 168 hours. CBC:  Recent Labs Lab 10/22/14 2155 10/23/14 0330 10/24/14 0629 10/25/14 0440  WBC 17.9* 14.6* 7.6 10.6*  NEUTROABS 15.1*  --   --   --   HGB 11.9* 10.3* 9.5*  10.5*  HCT 37.5 33.8* 31.6* 32.7*  MCV 84.8 84.9 86.6 85.8  PLT 307 279 268 303   Cardiac Enzymes:   No results for input(s): CKTOTAL, CKMB, CKMBINDEX, TROPONINI in the last 168 hours. BNP (last 3 results) No results for input(s): BNP in the last 8760 hours.  ProBNP (last 3 results)  Recent Labs  11/07/13 1139 12/05/13 1700  PROBNP 53.9 52.4    CBG:  Recent Labs Lab 10/24/14 2253 10/25/14 0323 10/25/14 0432 10/25/14 0744 10/25/14 1157  GLUCAP 323* 195* 192* 152* 311*    Recent Results (from the past 240 hour(s))  Urine culture     Status: None   Collection Time: 10/20/14  3:20 PM  Result Value Ref Range Status   Specimen Description URINE, RANDOM  Final   Special Requests NONE  Final   Culture >=100,000 COLONIES/mL ESCHERICHIA COLI  Final   Report Status 10/22/2014 FINAL  Final   Organism ID, Bacteria ESCHERICHIA COLI  Final      Susceptibility   Escherichia coli - MIC*    AMPICILLIN 4 SENSITIVE Sensitive     CEFAZOLIN <=4 SENSITIVE Sensitive     CEFTRIAXONE <=1 SENSITIVE Sensitive     CIPROFLOXACIN <=0.25 SENSITIVE Sensitive     GENTAMICIN <=1 SENSITIVE Sensitive     IMIPENEM <=0.25 SENSITIVE Sensitive     NITROFURANTOIN <=16 SENSITIVE Sensitive     TRIMETH/SULFA <=20 SENSITIVE Sensitive     AMPICILLIN/SULBACTAM 4 SENSITIVE Sensitive     PIP/TAZO <=4 SENSITIVE Sensitive     * >=100,000 COLONIES/mL ESCHERICHIA COLI  Blood Culture (routine x 2)     Status: None (Preliminary result)   Collection Time: 10/22/14  8:32 PM  Result Value Ref Range Status   Specimen Description BLOOD RIGHT HAND  Final   Special Requests BOTTLES DRAWN AEROBIC AND ANAEROBIC 5CC  Final   Culture NO GROWTH 2 DAYS  Final   Report Status PENDING  Incomplete  Blood Culture (routine x 2)     Status: None (Preliminary result)   Collection Time: 10/22/14 11:25 PM  Result Value Ref Range Status   Specimen Description BLOOD LEFT HAND  Final   Special Requests BOTTLES DRAWN AEROBIC ONLY 2CC   Final   Culture NO GROWTH 2 DAYS  Final   Report Status PENDING  Incomplete  MRSA PCR Screening     Status: None   Collection Time: 10/23/14 10:48 AM  Result Value Ref Range Status   MRSA by PCR NEGATIVE NEGATIVE Final    Comment:        The GeneXpert MRSA Assay (FDA approved for NASAL specimens only), is one component of a comprehensive MRSA colonization surveillance program. It is not intended to diagnose MRSA infection nor to guide or monitor treatment for MRSA infections.      Studies:  No results found.  Scheduled Meds: . aspirin EC  81 mg Oral Daily  . ciprofloxacin  250 mg Oral BID  . DULoxetine  60 mg Oral Daily  . enoxaparin (LOVENOX) injection  60 mg Subcutaneous Q24H  . ezetimibe  10 mg Oral Daily  . fenofibrate  160 mg Oral Daily  . gabapentin  300 mg Oral QID  . insulin aspart  0-20 Units Subcutaneous TID WC  . insulin aspart  0-5 Units Subcutaneous QHS  . insulin NPH Human  40 Units Subcutaneous QHS  . lactobacillus  1 g Oral TID WC  . magnesium oxide  400 mg Oral Daily  . polyethylene glycol  17 g Oral Daily  . potassium chloride  40 mEq Oral Once  . rOPINIRole  2 mg Oral BID  . senna-docusate  2 tablet Oral BID  . sodium chloride  3 mL Intravenous Q12H  . torsemide  100 mg Oral BID    Continuous Infusions:    Time spent: 66mins  Artice Holohan MD, PhD  [facility] Pager [phone]. If 7PM-7AM, please contact night-coverage at [email or web address], password [facility] 10/25/2014, 12:53 PM  LOS: 2 days

## 2014-10-25 NOTE — Procedures (Signed)
Pt states that the "CPAP drives me crazy".  Pt refuses the use.

## 2014-10-26 DIAGNOSIS — G4733 Obstructive sleep apnea (adult) (pediatric): Secondary | ICD-10-CM

## 2014-10-26 LAB — BASIC METABOLIC PANEL
Anion gap: 9 (ref 5–15)
BUN: 14 mg/dL (ref 6–20)
CHLORIDE: 91 mmol/L — AB (ref 101–111)
CO2: 40 mmol/L — AB (ref 22–32)
CREATININE: 0.99 mg/dL (ref 0.44–1.00)
Calcium: 8.9 mg/dL (ref 8.9–10.3)
GFR calc non Af Amer: 57 mL/min — ABNORMAL LOW (ref >60)
Glucose, Bld: 148 mg/dL — ABNORMAL HIGH (ref 65–99)
Potassium: 3.1 mmol/L — ABNORMAL LOW (ref 3.5–5.1)
Sodium: 140 mmol/L (ref 135–145)

## 2014-10-26 LAB — CBC WITH DIFFERENTIAL/PLATELET
Basophils Absolute: 0 10*3/uL (ref 0.0–0.1)
Basophils Relative: 0 % (ref 0–1)
Eosinophils Absolute: 0.2 10*3/uL (ref 0.0–0.7)
Eosinophils Relative: 3 % (ref 0–5)
HEMATOCRIT: 33.3 % — AB (ref 36.0–46.0)
HEMOGLOBIN: 10.2 g/dL — AB (ref 12.0–15.0)
LYMPHS ABS: 2.2 10*3/uL (ref 0.7–4.0)
Lymphocytes Relative: 30 % (ref 12–46)
MCH: 26.4 pg (ref 26.0–34.0)
MCHC: 30.6 g/dL (ref 30.0–36.0)
MCV: 86.3 fL (ref 78.0–100.0)
MONOS PCT: 11 % (ref 3–12)
Monocytes Absolute: 0.8 10*3/uL (ref 0.1–1.0)
NEUTROS ABS: 4.1 10*3/uL (ref 1.7–7.7)
NEUTROS PCT: 56 % (ref 43–77)
Platelets: 282 10*3/uL (ref 150–400)
RBC: 3.86 MIL/uL — ABNORMAL LOW (ref 3.87–5.11)
RDW: 15.6 % — ABNORMAL HIGH (ref 11.5–15.5)
WBC: 7.4 10*3/uL (ref 4.0–10.5)

## 2014-10-26 LAB — GLUCOSE, CAPILLARY
GLUCOSE-CAPILLARY: 140 mg/dL — AB (ref 65–99)
Glucose-Capillary: 259 mg/dL — ABNORMAL HIGH (ref 65–99)

## 2014-10-26 LAB — MAGNESIUM: Magnesium: 1.8 mg/dL (ref 1.7–2.4)

## 2014-10-26 MED ORDER — CARVEDILOL 3.125 MG PO TABS: 3.1250 mg | ORAL_TABLET | Freq: Two times a day (BID) | ORAL | Status: DC

## 2014-10-26 MED ORDER — CEPHALEXIN 500 MG PO CAPS: 500.0000 mg | ORAL_CAPSULE | Freq: Two times a day (BID) | ORAL | Status: DC

## 2014-10-26 MED ORDER — SENNOSIDES-DOCUSATE SODIUM 8.6-50 MG PO TABS: 2.0000 | ORAL_TABLET | Freq: Two times a day (BID) | ORAL | Status: DC

## 2014-10-26 MED ORDER — SERTRALINE HCL 25 MG PO TABS: 25.0000 mg | ORAL_TABLET | Freq: Every day | ORAL | Status: DC

## 2014-10-26 MED ORDER — POTASSIUM CHLORIDE CRYS ER 20 MEQ PO TBCR
40.0000 meq | EXTENDED_RELEASE_TABLET | Freq: Once | ORAL | Status: AC
  Administered 2014-10-26: 40 meq via ORAL
  Filled 2014-10-26: qty 2

## 2014-10-26 MED ORDER — FLORANEX PO PACK: 1.0000 g | PACK | Freq: Three times a day (TID) | ORAL | Status: DC

## 2014-10-26 MED ORDER — DICLOFENAC SODIUM 1 % TD GEL: 2.0000 g | Freq: Every day | TRANSDERMAL | Status: DC | PRN

## 2014-10-26 NOTE — Discharge Instructions (Signed)
Sleep Apnea Sleep apnea is disorder that affects a person's sleep. A person with sleep apnea has abnormal pauses in their breathing when they sleep. It is hard for them to get a good sleep. This makes a person tired during the day. It also can lead to other physical problems. There are three types of sleep apnea. One type is when breathing stops for a short time because your airway is blocked (obstructive sleep apnea). Another type is when the brain sometimes fails to give the normal signal to breathe to the muscles that control your breathing (central sleep apnea). The third type is a combination of the other two types. HOME CARE  Do not sleep on your back. Try to sleep on your side.  Take all medicine as told by your doctor.  Avoid alcohol, calming medicines (sedatives), and depressant drugs.  Try to lose weight if you are overweight. Talk to your doctor about a healthy weight goal. Your doctor may have you use a device that helps to open your airway. It can help you get the air that you need. It is called a positive airway pressure (PAP) device. There are three types of PAP devices:  Continuous positive airway pressure (CPAP) device.  Nasal expiratory positive airway pressure (EPAP) device.  Bilevel positive airway pressure (BPAP) device. MAKE SURE YOU:  Understand these instructions.  Will watch your condition.  Will get help right away if you are not doing well or get worse. Document Released: 11/17/2007 Document Revised: 01/25/2012 Document Reviewed: 06/11/2011 [facility] Patient Information 2015 [place], [place]. This information is not intended to replace advice given to you by your health care provider. Make sure you discuss any questions you have with your health care provider.  Urosepsis Urosepsis is an infection that has spread to the bloodstream from the urinary tract. It is a severe illness. If it is not treated immediately, urosepsis can be life threatening. CAUSES  The  cause of urosepsis is not known.  RISK FACTORS  Advanced age.  Having diabetes mellitus.  Having a weakened immune system.  Having kidney stones. SIGNS AND SYMPTOMS   Fever or low body temperature (hypothermia).  Rapid breathing (hyperventilation).  Chills.  Rapid heartbeat (tachycardia). When sepsis is severe, low blood pressure and decreased oxygen flow to your vital organs may also occur. This is called shock. DIAGNOSIS  Your health care provider may suspect urosepsis based on your medical history and a physical exam. Urine and blood tests may be done to confirm a diagnosis of urosepsis. Other tests, such as X-ray exams, ultrasound exams, or a CT scan, may be done to determine the severity of your condition.  TREATMENT  Urosepsis requires prompt treatment with medicines. You will receive fluids through an IV tube to support your blood pressure. You may also receive medicines to increase your blood pressure and medicines to control pain or nausea.  HOME CARE INSTRUCTIONS   Take medicines only as directed by your health care provider.  Drink enough fluids to keep your urine clear or pale yellow. In addition to water, cranberry juice is recommended.  Avoid caffeine, tea, and carbonated beverages. They can irritate the bladder.  Avoid alcohol. It may irritate the prostate, if this applies.  Get plenty of rest. Increase your activity as tolerated.  Keep all follow-up visits as directed by your health care provider.  Empty your bladder often. Avoid holding urine for long periods of time.  Empty your bladder before and after sexual intercourse.  After a bowel  movement, women should wipe from front to back using each tissue only once. SEEK MEDICAL CARE IF:   You develop back pain.  You develop nausea or vomiting.  Your symptoms are not better after 3 days.  You develop a fever or chills. SEEK IMMEDIATE MEDICAL CARE IF:   You feel light-headed or develop shortness of  breath.  You are getting worse, not better. MAKE SURE YOU:  Understand these instructions.  Will watch your condition.  Will get help right away if you are not doing well or get worse. Document Released: 02/07/2005 Document Revised: 06/24/2013 Document Reviewed: 10/15/2012 [facility] Patient Information 2015 [place], [place]. This information is not intended to replace advice given to you by your health care provider. Make sure you discuss any questions you have with your health care provider.

## 2014-10-26 NOTE — Discharge Summary (Addendum)
Discharge Summary  Melissa Myers XBM:[id number] DOB: 07/21/1945  PCP: Gwendolyn Grant, MD  Admit date: 10/22/2014 Discharge date: 10/26/2014  Time spent: >65mns  Recommendations for Outpatient Follow-up:  1. F/u with PMD within a week for hospital discharge follow up, continue titrate bp meds and diuretics, suspect patient not compliant with home meds.   Discharge Diagnoses:  Active Hospital Problems   Diagnosis Date Noted  . Severe sepsis 10/23/2014  . UTI (urinary tract infection) 10/23/2014  . Sepsis 10/23/2014  . CKD (chronic kidney disease), stage III 01/01/2014  . Bilateral leg pain 12/23/2011  . 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 Chronic diastolic heart failure [id number] . Morbid obesity 04/20/2009  . Dyslipidemia 04/20/2009  . Essential hypertension 04/20/2009    Resolved Hospital Problems   Diagnosis Date Noted Date Resolved  No resolved problems to display.    Discharge Condition: stable  Diet recommendation: heart healthy/carb modified  Filed Weights   10/23/14 0250 10/25/14 0510 10/26/14 04403 Weight: 279 lb 8.7 oz (126.8 kg) 272 lb 4.3 oz (123.5 kg) 273 lb 5.9 oz (124 kg)    History of present illness:  Melissa IVINSis a 69y.o. female has a past medical history significant for DM, diastolic CHF, morbid obesity, HTN, HLD presents to the ED with chief complaint of a UTI and confusion per family. Patient has been having dysuria for the past few days, she was seen for a regular follow up with CHF clinic and a UA was obtained which showed E coli. She was called and Ciprofloxacin was called in to her pharmacy however it is unclear if she took the antibiotic at home. Patient is confused in the ED, she can contribute some to the story but daughter is bedside. Her daughter tells me that today she became more lethargic, sleepy and confused and felt "hot". Patient denies any chest pain, endorses chronic dyspnea,  denies palpitations. She denies abdominal pain, nausea, vomiting of diarrhea. She endorses a 7 lb weight gain in the past 1-2 weeks. She appears confused about her home medications and family is concerned that she cannot manage it on her own.   In the ED, patient is febrile to 102.5, she is normotensive, not tachycardic (on BB), she has a leukocytosis of 18 and elevated lactic acid to 2.78. UA has some evidence of a UTI. TRH asked for admission for sepsis due to UTI. Cultures were obtained and she was started on Vancomycin and Zosyn.   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   Bilateral leg pain   CKD (chronic kidney disease), stage III   Severe sepsis   UTI (urinary tract infection)   Sepsis   Sepsis due to UTI - with fever, decreased mentation, metabolic encephalopathy, tachypnea, significant leukocytosis and a source on presentation - blood cultures no growth - fever subsided, wbc normalized, narrow abx to rocephin, awaiting final culture result (urine culture from 8/30 + ecoli ) Addendum: reported severe burning with rocephin infusion, this is d/ced, changed to oral cipro per pharmacy dosing -continue oral cipro -QTc mildly prolonged at 471, discharge with oral keflex for three more days.  Acute on chronic diastolic heart failure - concerns for non compliance at home, reported has not been taking diuretics at home - 7 lb weight gain - she has received 2L  NS bolus in the ED due to sepsis - resume diuretic on 9/1, per med rec patient on metolazone 2.5 every other day /torsemide 17m po bid and spironolecton, not sure if patient is compliant with this regimen - d/c vanc/zosyn ,change to oral abx on 9/2 to limit fluids, , had wheezing on 9/2 -wheezing resolved on 9/3, lower extremity edema subsided. Continue diuretics -at discharge,  no wheezing, lower extremity edema almost resolved, pmd to close monitor and titrate diuretics.  Chronic hypoxic respiratory failure - on home oxygen - initial abg showed chronic mild co2 retention with normal PH.  CKD III - creatinine at baseline  HTN - coreg held initially in the setting of sepsis  -9/3, restart coreg, titrate, monitor heart rate -patient was on coreg 225mpo bid prior to admission, not sure patient has been taking this or not, she does has tendency of bradycardia, heart rate in the 50's, coreg dose decreased to 3.125 mg bid, she is continued on home hydralazine, pmd to continue monitor bp and heart rate and titrate meds accordingly  DM - a1c 8.7 -resume home regimen  H/o choking: reported is under evaluation by oral surgeon , was recommended to have surgery, ? Zenker's diverticulum. Soft diet, thin liquid per speech. Continue outpatient oral surgery follow up.  Morbid obesity, life style modification   H/o depression: patient does not want to take cymbalta, reported it makes her restless leg symptom worse, will try zoloft. pmd to monitor effect and side effect.  Code Status: full , confirmed with patient and family  Family Communication: patient   Disposition Plan: discharge to home with home health on 9/4   Consultants:  none  Procedures:  none  Antibiotics: Vanc/zosyn from 9/1 to 9/2 Rocephinx1 from 9/2 cipro from 9/2   Discharge Exam: BP 142/49 mmHg  Pulse 63  Temp(Src) 98.7 F (37.1 C) (Oral)  Resp 20  Ht 5' 10"  (1.778 m)  Wt 273 lb 5.9 oz (124 kg)  BMI 39.22 kg/m2  SpO2 99%   General: obese, NAD, full alert  Cardiovascular: RRR  Respiratory: CTABL, no wheezing  Abdomen: Soft/ND/NT, positive BS  Musculoskeletal: pitting Edema resolving, chronic venous stasis changes, tender, no erythema  Neuro: aaox3   Discharge Instructions You were cared for by a hospitalist during your hospital stay. If you have any questions about  your discharge medications or the care you received while you were in the hospital after you are discharged, you can call the unit and asked to speak with the hospitalist on call if the hospitalist that took care of you is not available. Once you are discharged, your primary care physician will handle any further medical issues. Please note that NO REFILLS for any discharge medications will be authorized once you are discharged, as it is imperative that you return to your primary care physician (or establish a relationship with a primary care physician if you do not have one) for your aftercare needs so that they can reassess your need for medications and monitor your lab values.      Discharge Instructions    Diet - low sodium heart healthy    Complete by:  As directed      Face-to-face encounter (required for Medicare/Medicaid patients)    Complete by:  As directed   I Cannon Arreola certify that this patient is under my care and that I, or a nurse practitioner or physician's assistant working with me, had a face-to-face encounter that meets the physician face-to-face encounter requirements  with this patient on 10/25/2014. The encounter with the patient was in whole, or in part for the following medical condition(s) which is the primary reason for home health care (List medical condition): FTT  The encounter with the patient was in whole, or in part, for the following medical condition, which is the primary reason for home health care:  FTT  I certify that, based on my findings, the following services are medically necessary home health services:   Nursing Physical therapy    Reason for Medically Necessary Home Health Services:  Skilled Nursing- Change/Decline in Patient Status  My clinical findings support the need for the above services:  Shortness of breath with activity  Further, I certify that my clinical findings support that this patient is homebound due to:  Shortness of Breath with activity      Home Health    Complete by:  As directed   To provide the following care/treatments:   PT OT RN       Increase activity slowly    Complete by:  As directed             Medication List    STOP taking these medications        ciprofloxacin 250 MG tablet  Commonly known as:  CIPRO     DULoxetine 60 MG capsule  Commonly known as:  CYMBALTA      TAKE these medications        aspirin EC 81 MG tablet  Take 1 tablet (81 mg total) by mouth daily.     B-complex with vitamin C tablet  Take 1 tablet by mouth daily.     carvedilol 3.125 MG tablet  Commonly known as:  COREG  Take 1 tablet (3.125 mg total) by mouth 2 (two) times daily with a meal.     cephALEXin 500 MG capsule  Commonly known as:  KEFLEX  Take 1 capsule (500 mg total) by mouth 2 (two) times daily.     diclofenac sodium 1 % Gel  Commonly known as:  VOLTAREN  Apply 2 g topically daily as needed (knee pain).     ezetimibe 10 MG tablet  Commonly known as:  ZETIA  Take 1 tablet (10 mg total) by mouth daily.     fenofibrate 160 MG tablet  TAKE 1 TABLET BY MOUTH DAILY     gabapentin 300 MG capsule  Commonly known as:  NEURONTIN  Take 1 capsule (300 mg total) by mouth 4 (four) times daily.     Glucosamine HCl 1000 MG Tabs  Take 1,000 mg by mouth 2 (two) times daily.     glucose blood test strip  Commonly known as:  ONETOUCH VERIO  Test 3 time a day     hydrALAZINE 25 MG tablet  Commonly known as:  APRESOLINE  Take 1 tablet (25 mg total) by mouth 3 (three) times daily.     hydrOXYzine 10 MG tablet  Commonly known as:  ATARAX/VISTARIL  Take 10 mg by mouth 3 (three) times daily as needed for anxiety.     insulin NPH Human 100 UNIT/ML injection  Commonly known as:  NOVOLIN N  Inject 0.4 mLs (40 Units total) into the skin at bedtime.     insulin regular 100 units/mL injection  Commonly known as:  HUMULIN R  3 times a day (just before each meal) 60-70-80 units     Insulin Syringe-Needle U-100 27G X  1/2" 1 ML Misc  Commonly known as:  B-D INSULIN SYRINGE 1CC/27G  Use to inject insulin 4 times daily as advised.     lactobacillus Pack  Take 1 packet (1 g total) by mouth 3 (three) times daily with meals.     Magnesium 500 MG Tabs  Take 500 mg by mouth daily.     metolazone 2.5 MG tablet  Commonly known as:  ZAROXOLYN  Take 1 tablet (2.5 mg total) by mouth every other day.     omega-3 acid ethyl esters 1 G capsule  Commonly known as:  LOVAZA  Take 1 g by mouth 2 (two) times daily.     ONE TOUCH LANCETS Misc  Use one lancet per blood sugar testing.3 times a day     ONETOUCH VERIO FLEX SYSTEM W/DEVICE Kit  1 kit by Does not apply route once. Dx Code E11.9     oxycodone 5 MG capsule  Commonly known as:  OXY-IR  Take 5 mg by mouth every 6 (six) hours.     potassium chloride SA 20 MEQ tablet  Commonly known as:  K-DUR,KLOR-CON  Take 40 mEq by mouth 2 (two) times daily.     rOPINIRole 2 MG tablet  Commonly known as:  REQUIP  Take 2 mg by mouth 2 (two) times daily.     senna-docusate 8.6-50 MG per tablet  Commonly known as:  Senokot-S  Take 2 tablets by mouth 2 (two) times daily.     sertraline 25 MG tablet  Commonly known as:  ZOLOFT  Take 1 tablet (25 mg total) by mouth daily.     spironolactone 25 MG tablet  Commonly known as:  ALDACTONE  Take 1 tablet (25 mg total) by mouth daily.     torsemide 100 MG tablet  Commonly known as:  DEMADEX  Take 1 tablet (100 mg total) by mouth 2 (two) times daily.     triamcinolone cream 0.1 %  Commonly known as:  KENALOG  Apply 1 application topically 2 (two) times daily.     Vitamin D3 2000 UNITS capsule  Take 2,000 Units by mouth 4 (four) times daily - after meals and at bedtime.       Allergies  Allergen Reactions  . Cymbalta [Duloxetine Hcl] Other (See Comments)    Restless leg syndrome  . Statins Other (See Comments)    Statin drugs cause muscle pain / "muscle damage"--was told by MD not to take  . Sulfa  Antibiotics Diarrhea  . Levemir [Insulin Detemir] Itching  . Morphine Other (See Comments)    GI upset and headaches  . Zinc Swelling and Rash   Follow-up Information    Follow up with Gwendolyn Grant, MD In 1 week.   Specialty:  Internal Medicine   Why:  hospital discharge follow up   Contact information:   [address] [place] [address] [phone]        The results of significant diagnostics from this hospitalization (including imaging, microbiology, ancillary and laboratory) are listed below for reference.    Significant Diagnostic Studies: Ct Head Wo Contrast  10/23/2014   CLINICAL DATA:  Altered mental status  EXAM: CT HEAD WITHOUT CONTRAST  TECHNIQUE: Contiguous axial images were obtained from the base of the skull through the vertex without intravenous contrast.  COMPARISON:  11/27/2012 MRI  FINDINGS: There is no intracranial hemorrhage, mass or evidence of acute infarction. There is mild generalized atrophy. There is mild chronic microvascular ischemic change. There is no significant extra-axial  fluid collection.  No acute intracranial findings are evident.  IMPRESSION: No acute intracranial findings. There is mild generalized atrophy and chronic small vessel disease.   Electronically Signed   By: Andreas Newport M.D.   On: 10/23/2014 00:17   Dg Chest Port 1 View  10/22/2014   CLINICAL DATA:  Shortness of breath, weakness, sepsis  EXAM: PORTABLE CHEST - 1 VIEW  COMPARISON:  08/01/2013  FINDINGS: Moderate enlargement of the cardiac silhouette noted with central vascular congestion. No focal pulmonary opacity. No pleural effusion. Cardiac leads obscure detail.  IMPRESSION: Cardiomegaly with central vascular congestion but no focal acute finding.   Electronically Signed   By: Conchita Paris M.D.   On: 10/22/2014 22:59    Microbiology: Recent Results (from the past 240 hour(s))  Urine culture     Status: None   Collection Time: 10/20/14   3:20 PM  Result Value Ref Range Status   Specimen Description URINE, RANDOM  Final   Special Requests NONE  Final   Culture >=100,000 COLONIES/mL ESCHERICHIA COLI  Final   Report Status 10/22/2014 FINAL  Final   Organism ID, Bacteria ESCHERICHIA COLI  Final      Susceptibility   Escherichia coli - MIC*    AMPICILLIN 4 SENSITIVE Sensitive     CEFAZOLIN <=4 SENSITIVE Sensitive     CEFTRIAXONE <=1 SENSITIVE Sensitive     CIPROFLOXACIN <=0.25 SENSITIVE Sensitive     GENTAMICIN <=1 SENSITIVE Sensitive     IMIPENEM <=0.25 SENSITIVE Sensitive     NITROFURANTOIN <=16 SENSITIVE Sensitive     TRIMETH/SULFA <=20 SENSITIVE Sensitive     AMPICILLIN/SULBACTAM 4 SENSITIVE Sensitive     PIP/TAZO <=4 SENSITIVE Sensitive     * >=100,000 COLONIES/mL ESCHERICHIA COLI  Blood Culture (routine x 2)     Status: None (Preliminary result)   Collection Time: 10/22/14  8:32 PM  Result Value Ref Range Status   Specimen Description BLOOD RIGHT HAND  Final   Special Requests BOTTLES DRAWN AEROBIC AND ANAEROBIC 5CC  Final   Culture NO GROWTH 3 DAYS  Final   Report Status PENDING  Incomplete  Blood Culture (routine x 2)     Status: None (Preliminary result)   Collection Time: 10/22/14 11:25 PM  Result Value Ref Range Status   Specimen Description BLOOD LEFT HAND  Final   Special Requests BOTTLES DRAWN AEROBIC ONLY 2CC  Final   Culture NO GROWTH 3 DAYS  Final   Report Status PENDING  Incomplete  MRSA PCR Screening     Status: None   Collection Time: 10/23/14 10:48 AM  Result Value Ref Range Status   MRSA by PCR NEGATIVE NEGATIVE Final    Comment:        The GeneXpert MRSA Assay (FDA approved for NASAL specimens only), is one component of a comprehensive MRSA colonization surveillance program. It is not intended to diagnose MRSA infection nor to guide or monitor treatment for MRSA infections.      Labs: Basic Metabolic Panel:  Recent Labs Lab 10/22/14 2155 10/23/14 0330 10/24/14 0629  10/25/14 0440 10/26/14 0814  NA 143 141 142 141 140  K 3.8 3.3* 3.0* 3.4* 3.1*  CL 96* 98* 97* 93* 91*  CO2 34* 30 37* 36* 40*  GLUCOSE 110* 161* 231* 185* 148*  BUN 34* 29* 21* 15 14  CREATININE 1.19* 1.20* 1.15* 1.07* 0.99  CALCIUM 9.5 8.3* 8.4* 8.6* 8.9  MG  --   --  2.0 1.9 1.8   Liver  Function Tests:  Recent Labs Lab 10/22/14 2155  AST 33  ALT 21  ALKPHOS 57  BILITOT 0.8  PROT 7.1  ALBUMIN 3.3*   No results for input(s): LIPASE, AMYLASE in the last 168 hours. No results for input(s): AMMONIA in the last 168 hours. CBC:  Recent Labs Lab 10/22/14 2155 10/23/14 0330 10/24/14 0629 10/25/14 0440 10/26/14 0814  WBC 17.9* 14.6* 7.6 10.6* 7.4  NEUTROABS 15.1*  --   --   --  4.1  HGB 11.9* 10.3* 9.5* 10.5* 10.2*  HCT 37.5 33.8* 31.6* 32.7* 33.3*  MCV 84.8 84.9 86.6 85.8 86.3  PLT 307 279 268 303 282   Cardiac Enzymes: No results for input(s): CKTOTAL, CKMB, CKMBINDEX, TROPONINI in the last 168 hours. BNP: BNP (last 3 results) No results for input(s): BNP in the last 8760 hours.  ProBNP (last 3 results)  Recent Labs  11/07/13 1139 12/05/13 1700  PROBNP 53.9 52.4    CBG:  Recent Labs Lab 10/25/14 1157 10/25/14 1708 10/25/14 2114 10/26/14 0801 10/26/14 1151  GLUCAP 311* 239* 287* 140* 259*       Signed:  Santosha Jividen MD, PhD  [facility] 10/26/2014, 3:14 PM

## 2014-10-26 NOTE — Plan of Care (Signed)
Problem: Phase III Progression Outcomes Goal: Activity at appropriate level-compared to baseline (UP IN CHAIR FOR HEMODIALYSIS)  Outcome: Completed/Met Date Met:  10/26/14 Home health arranged outpatient  Problem: Discharge Progression Outcomes Goal: Activity appropriate for discharge plan Outcome: Completed/Met Date Met:  10/26/14 Home health set up outpatient

## 2014-10-26 NOTE — Care Management Note (Signed)
Case Management Note  Patient Details  Name: LUCCIANA HEAD MRN: [id number] Date of Birth: 1945-09-07  Subjective/Objective:      Sepsis due to UTI              Action/Plan:  [place] aware of dc home today with [facility]  Expected Discharge Date:  10/26/2014             Expected Discharge Plan:  [place]  In-House Referral:     Discharge planning Services  CM Consult  Post Acute Care Choice:  Home Health Choice offered to:  Patient  DME Arranged:  Oxygen (prior to admission) DME Agency:  [place]:  RN, PT [facility] Agency:  [place]  Status of Service:  Completed, signed off  Medicare Important Message Given:    Date Medicare IM Given:    Medicare IM give by:    Date Additional Medicare IM Given:    Additional Medicare Important Message give by:     If discussed at [place] of Stay Meetings, dates discussed:    Additional Comments:  Erenest Rasher, RN 10/26/2014, 8:42 PM

## 2014-10-26 NOTE — Progress Notes (Signed)
Pt as well as pt's husband & dgtr were given discharge instructions on upcoming appointments as well as when to call the doctor. Pt as well as pt's family stated they did not have any questions. Pt informed that she would be receiving Home Health outpatient. Paper work signed & put in the chart. Hoover Brunette, RN

## 2014-10-27 DIAGNOSIS — I129 Hypertensive chronic kidney disease with stage 1 through stage 4 chronic kidney disease, or unspecified chronic kidney disease: Secondary | ICD-10-CM | POA: Diagnosis not present

## 2014-10-27 DIAGNOSIS — E785 Hyperlipidemia, unspecified: Secondary | ICD-10-CM | POA: Diagnosis not present

## 2014-10-27 DIAGNOSIS — N390 Urinary tract infection, site not specified: Secondary | ICD-10-CM | POA: Diagnosis not present

## 2014-10-27 DIAGNOSIS — I5032 Chronic diastolic (congestive) heart failure: Secondary | ICD-10-CM | POA: Diagnosis not present

## 2014-10-27 DIAGNOSIS — Z794 Long term (current) use of insulin: Secondary | ICD-10-CM | POA: Diagnosis not present

## 2014-10-27 DIAGNOSIS — E1122 Type 2 diabetes mellitus with diabetic chronic kidney disease: Secondary | ICD-10-CM | POA: Diagnosis not present

## 2014-10-27 DIAGNOSIS — N183 Chronic kidney disease, stage 3 (moderate): Secondary | ICD-10-CM | POA: Diagnosis not present

## 2014-10-28 LAB — CULTURE, BLOOD (ROUTINE X 2)
CULTURE: NO GROWTH
Culture: NO GROWTH

## 2014-10-29 ENCOUNTER — Ambulatory Visit: Payer: Medicare Other | Admitting: Endocrinology

## 2014-10-29 DIAGNOSIS — I5032 Chronic diastolic (congestive) heart failure: Secondary | ICD-10-CM | POA: Diagnosis not present

## 2014-10-29 DIAGNOSIS — I129 Hypertensive chronic kidney disease with stage 1 through stage 4 chronic kidney disease, or unspecified chronic kidney disease: Secondary | ICD-10-CM | POA: Diagnosis not present

## 2014-10-29 DIAGNOSIS — N390 Urinary tract infection, site not specified: Secondary | ICD-10-CM | POA: Diagnosis not present

## 2014-10-29 DIAGNOSIS — E785 Hyperlipidemia, unspecified: Secondary | ICD-10-CM | POA: Diagnosis not present

## 2014-10-29 DIAGNOSIS — E1122 Type 2 diabetes mellitus with diabetic chronic kidney disease: Secondary | ICD-10-CM | POA: Diagnosis not present

## 2014-10-29 DIAGNOSIS — N183 Chronic kidney disease, stage 3 (moderate): Secondary | ICD-10-CM | POA: Diagnosis not present

## 2014-10-31 DIAGNOSIS — N183 Chronic kidney disease, stage 3 (moderate): Secondary | ICD-10-CM | POA: Diagnosis not present

## 2014-10-31 DIAGNOSIS — E1122 Type 2 diabetes mellitus with diabetic chronic kidney disease: Secondary | ICD-10-CM | POA: Diagnosis not present

## 2014-10-31 DIAGNOSIS — N390 Urinary tract infection, site not specified: Secondary | ICD-10-CM | POA: Diagnosis not present

## 2014-10-31 DIAGNOSIS — E785 Hyperlipidemia, unspecified: Secondary | ICD-10-CM | POA: Diagnosis not present

## 2014-10-31 DIAGNOSIS — I129 Hypertensive chronic kidney disease with stage 1 through stage 4 chronic kidney disease, or unspecified chronic kidney disease: Secondary | ICD-10-CM | POA: Diagnosis not present

## 2014-10-31 DIAGNOSIS — I5032 Chronic diastolic (congestive) heart failure: Secondary | ICD-10-CM | POA: Diagnosis not present

## 2014-11-03 ENCOUNTER — Inpatient Hospital Stay: Payer: Medicare Other | Admitting: Internal Medicine

## 2014-11-03 DIAGNOSIS — N183 Chronic kidney disease, stage 3 (moderate): Secondary | ICD-10-CM | POA: Diagnosis not present

## 2014-11-03 DIAGNOSIS — N390 Urinary tract infection, site not specified: Secondary | ICD-10-CM | POA: Diagnosis not present

## 2014-11-03 DIAGNOSIS — I129 Hypertensive chronic kidney disease with stage 1 through stage 4 chronic kidney disease, or unspecified chronic kidney disease: Secondary | ICD-10-CM | POA: Diagnosis not present

## 2014-11-03 DIAGNOSIS — E785 Hyperlipidemia, unspecified: Secondary | ICD-10-CM | POA: Diagnosis not present

## 2014-11-03 DIAGNOSIS — E1122 Type 2 diabetes mellitus with diabetic chronic kidney disease: Secondary | ICD-10-CM | POA: Diagnosis not present

## 2014-11-03 DIAGNOSIS — I5032 Chronic diastolic (congestive) heart failure: Secondary | ICD-10-CM | POA: Diagnosis not present

## 2014-11-04 ENCOUNTER — Inpatient Hospital Stay: Payer: Medicare Other | Admitting: Internal Medicine

## 2014-11-04 DIAGNOSIS — N390 Urinary tract infection, site not specified: Secondary | ICD-10-CM | POA: Diagnosis not present

## 2014-11-04 DIAGNOSIS — I5032 Chronic diastolic (congestive) heart failure: Secondary | ICD-10-CM | POA: Diagnosis not present

## 2014-11-04 DIAGNOSIS — E785 Hyperlipidemia, unspecified: Secondary | ICD-10-CM | POA: Diagnosis not present

## 2014-11-04 DIAGNOSIS — I129 Hypertensive chronic kidney disease with stage 1 through stage 4 chronic kidney disease, or unspecified chronic kidney disease: Secondary | ICD-10-CM | POA: Diagnosis not present

## 2014-11-04 DIAGNOSIS — E1122 Type 2 diabetes mellitus with diabetic chronic kidney disease: Secondary | ICD-10-CM | POA: Diagnosis not present

## 2014-11-04 DIAGNOSIS — N183 Chronic kidney disease, stage 3 (moderate): Secondary | ICD-10-CM | POA: Diagnosis not present

## 2014-11-05 ENCOUNTER — Other Ambulatory Visit (HOSPITAL_COMMUNITY): Payer: Self-pay | Admitting: Internal Medicine

## 2014-11-05 ENCOUNTER — Encounter: Payer: Self-pay | Admitting: Internal Medicine

## 2014-11-05 ENCOUNTER — Ambulatory Visit (INDEPENDENT_AMBULATORY_CARE_PROVIDER_SITE_OTHER): Payer: Medicare Other | Admitting: Internal Medicine

## 2014-11-05 VITALS — BP 138/54 | HR 85 | Temp 98.8°F | Resp 18

## 2014-11-05 DIAGNOSIS — E1029 Type 1 diabetes mellitus with other diabetic kidney complication: Secondary | ICD-10-CM | POA: Diagnosis not present

## 2014-11-05 DIAGNOSIS — E876 Hypokalemia: Secondary | ICD-10-CM

## 2014-11-05 DIAGNOSIS — IMO0002 Reserved for concepts with insufficient information to code with codable children: Secondary | ICD-10-CM

## 2014-11-05 DIAGNOSIS — N390 Urinary tract infection, site not specified: Secondary | ICD-10-CM | POA: Diagnosis not present

## 2014-11-05 DIAGNOSIS — Z23 Encounter for immunization: Secondary | ICD-10-CM

## 2014-11-05 DIAGNOSIS — B369 Superficial mycosis, unspecified: Secondary | ICD-10-CM

## 2014-11-05 DIAGNOSIS — E1065 Type 1 diabetes mellitus with hyperglycemia: Secondary | ICD-10-CM

## 2014-11-05 MED ORDER — KETOCONAZOLE 2 % EX CREA: 1.0000 "application " | TOPICAL_CREAM | Freq: Every day | CUTANEOUS | Status: DC

## 2014-11-05 NOTE — Progress Notes (Signed)
Pre visit review using our clinic review tool, if applicable. No additional management support is needed unless otherwise documented below in the visit note. 

## 2014-11-05 NOTE — Patient Instructions (Signed)
To increase  Potassium (K+) increase citrus fruits & bananas in diet and use the salt substitute No Salt, which contains  potassium , to season food @ the table.    Apply Nizoral each day until rash is gone.  The following nutritional changes may help prevent Diabetes progression & complications.  White carbohydrates (potatoes, rice, bread, and pasta) cause a high spike of the sugar level which stays elevated for a significant period of time (called sugar"load").  For example a  baked potato has a cup of sugar and a  french fry  2 teaspoons of sugar.  More complex carbs such as yams, wild  rice, whole grained bread &  wheat pasta have been much lower spike and persistent load of sugar than the white carbs. The pancreas excretes excess insulin in response to the high spike & load of sugar .

## 2014-11-05 NOTE — Progress Notes (Signed)
   Subjective:    Patient ID: Melissa Myers, female    DOB: March 30, 1945, 69 y.o.   MRN: [id number]  HPI   She was hospitalized 8/31-10/26/14  for urosepsis which presented as mental status changes in the context of dysuria present for a few days; temperature to 102.5; and white count of 18,000. Additionally her lactic acid level was 2.78. She was treated with Vanc and Zosyn.  Urine culture revealed over 100,000 colonies of Escherichia coli 10/20/14.This was collected at the [facility] because of dark, cloudy & malodorous urine. Cipro was called in 8/30. It is unclear whether she ever took the medication.Her husband thought she had been on "Keflex".  At the time of discharge potassium was 3.1, creatinine 0.99, glucose 148, hematocrit 33.3. Her white count was normal at 7400.   Review of Systems  She is diabetic; her last A1c on record was 10/23/14 with a value of 8.7%. In the hospital her glucoses ranged from low of 148 - high of 231. Since discharge her glucoses have been 105-194.    She also describes pill dysphagia particularly with potassium pills. She's been unable to take them . She has some dysphagia with meat as well.  At this time she has no active genitourinary symptoms except for nocturia.  Fatigue is a chronic issue. She describes chronic intolerance to cold.Last TSH was 2.010 in 10/15.  She has had pruritic lesions over the lower extremities, especially the left lower extremity. This is in the context of having two cats and a dog at home. Her husband cleans the litter box.      Objective:   Physical Exam Pertinent or positive findings include: She appears chronically ill and is in wheelchair. She has a grade 1 systolic murmur. Abdomen is protuberant. She has tense edema of the lower extremities. Pedal pulses are decreased, particularly posterior tibial pulses. She has stasis dermatitis over the lower extremities. She has scattered rough eschar-like lesions over the  lower extremity. The 2 largest are 48 x 22 mm of the left anterior thigh and one 30 x 22 over the left anterior shin.   General appearance :adequately nourished; in no distress.  Eyes: No conjunctival inflammation or scleral icterus is present.  Heart:  Normal rate and regular rhythm. S1 and S2 normal without gallop,  click, rub or other extra sounds    Lungs:Chest clear to auscultation; no wheezes, rhonchi,rales ,or rubs present.No increased work of breathing.   Abdomen: bowel sounds normal, soft and non-tender without masses, organomegaly or hernias noted.  No guarding or rebound.   Vascular : all pulses equal ; no bruits present.  Skin:Warm & dry.  Intact without suspicious lesions or rashes ; no tenting or jaundice   Lymphatic: No lymphadenopathy is noted about the head, neck, axilla.   Neuro: Strength, tone markedly decreased.     Assessment & Plan:  #1 urosepsis with mental status change, resolved  #2 hypokalemia; she has pill dysphagia with the large potassium pills and is therefore non compliant  #3. Lesions of the lower extremities which suggest possible superimposed fungal infection  #4 diabetes, poor control  Plan: See after visit summary and orders

## 2014-11-06 DIAGNOSIS — E785 Hyperlipidemia, unspecified: Secondary | ICD-10-CM | POA: Diagnosis not present

## 2014-11-06 DIAGNOSIS — N183 Chronic kidney disease, stage 3 (moderate): Secondary | ICD-10-CM | POA: Diagnosis not present

## 2014-11-06 DIAGNOSIS — E1122 Type 2 diabetes mellitus with diabetic chronic kidney disease: Secondary | ICD-10-CM | POA: Diagnosis not present

## 2014-11-06 DIAGNOSIS — I5032 Chronic diastolic (congestive) heart failure: Secondary | ICD-10-CM | POA: Diagnosis not present

## 2014-11-06 DIAGNOSIS — I129 Hypertensive chronic kidney disease with stage 1 through stage 4 chronic kidney disease, or unspecified chronic kidney disease: Secondary | ICD-10-CM | POA: Diagnosis not present

## 2014-11-06 DIAGNOSIS — N390 Urinary tract infection, site not specified: Secondary | ICD-10-CM | POA: Diagnosis not present

## 2014-11-07 DIAGNOSIS — R40242 Glasgow coma scale score 9-12: Secondary | ICD-10-CM | POA: Diagnosis not present

## 2014-11-10 DIAGNOSIS — I5032 Chronic diastolic (congestive) heart failure: Secondary | ICD-10-CM | POA: Diagnosis not present

## 2014-11-10 DIAGNOSIS — E785 Hyperlipidemia, unspecified: Secondary | ICD-10-CM | POA: Diagnosis not present

## 2014-11-10 DIAGNOSIS — N183 Chronic kidney disease, stage 3 (moderate): Secondary | ICD-10-CM | POA: Diagnosis not present

## 2014-11-10 DIAGNOSIS — N390 Urinary tract infection, site not specified: Secondary | ICD-10-CM | POA: Diagnosis not present

## 2014-11-10 DIAGNOSIS — I129 Hypertensive chronic kidney disease with stage 1 through stage 4 chronic kidney disease, or unspecified chronic kidney disease: Secondary | ICD-10-CM | POA: Diagnosis not present

## 2014-11-10 DIAGNOSIS — E1122 Type 2 diabetes mellitus with diabetic chronic kidney disease: Secondary | ICD-10-CM | POA: Diagnosis not present

## 2014-11-11 ENCOUNTER — Ambulatory Visit (HOSPITAL_COMMUNITY)
Admission: RE | Admit: 2014-11-11 | Discharge: 2014-11-11 | Disposition: A | Discharge status: Home / Self Care | Payer: Medicare Other | Source: Ambulatory Visit | Attending: Internal Medicine | Admitting: Internal Medicine

## 2014-11-11 ENCOUNTER — Encounter (HOSPITAL_COMMUNITY): Payer: Self-pay

## 2014-11-11 DIAGNOSIS — I129 Hypertensive chronic kidney disease with stage 1 through stage 4 chronic kidney disease, or unspecified chronic kidney disease: Secondary | ICD-10-CM | POA: Diagnosis not present

## 2014-11-11 DIAGNOSIS — E1142 Type 2 diabetes mellitus with diabetic polyneuropathy: Secondary | ICD-10-CM | POA: Insufficient documentation

## 2014-11-11 DIAGNOSIS — N183 Chronic kidney disease, stage 3 unspecified: Secondary | ICD-10-CM

## 2014-11-11 DIAGNOSIS — Z9981 Dependence on supplemental oxygen: Secondary | ICD-10-CM | POA: Diagnosis not present

## 2014-11-11 DIAGNOSIS — N390 Urinary tract infection, site not specified: Secondary | ICD-10-CM | POA: Diagnosis not present

## 2014-11-11 DIAGNOSIS — E1122 Type 2 diabetes mellitus with diabetic chronic kidney disease: Secondary | ICD-10-CM | POA: Diagnosis not present

## 2014-11-11 DIAGNOSIS — Z8249 Family history of ischemic heart disease and other diseases of the circulatory system: Secondary | ICD-10-CM | POA: Insufficient documentation

## 2014-11-11 DIAGNOSIS — Z7982 Long term (current) use of aspirin: Secondary | ICD-10-CM | POA: Diagnosis not present

## 2014-11-11 DIAGNOSIS — E785 Hyperlipidemia, unspecified: Secondary | ICD-10-CM | POA: Insufficient documentation

## 2014-11-11 DIAGNOSIS — G2581 Restless legs syndrome: Secondary | ICD-10-CM | POA: Insufficient documentation

## 2014-11-11 DIAGNOSIS — Z794 Long term (current) use of insulin: Secondary | ICD-10-CM | POA: Diagnosis not present

## 2014-11-11 DIAGNOSIS — I5032 Chronic diastolic (congestive) heart failure: Secondary | ICD-10-CM | POA: Insufficient documentation

## 2014-11-11 DIAGNOSIS — Z79899 Other long term (current) drug therapy: Secondary | ICD-10-CM | POA: Diagnosis not present

## 2014-11-11 DIAGNOSIS — M25569 Pain in unspecified knee: Secondary | ICD-10-CM | POA: Insufficient documentation

## 2014-11-11 DIAGNOSIS — E669 Obesity, unspecified: Secondary | ICD-10-CM | POA: Insufficient documentation

## 2014-11-11 NOTE — Progress Notes (Signed)
Patient ID: TUERE NWOSU, female   DOB: [birth year], 69 y.o.   MRN: [id number] PCP: Dr. Asa Lente Endocrinologist: Dr Loanne Drilling  69 yo with history of HTN, DM, hyperlipidemia, OHS/OSA, and chronic dyspnea/diastolic CHF.  Patient had an echo in 04/2009 showing moderate LVH and preserved LV systolic function. However, there was a very large LV mid-cavity gradient with valsalva.  Patient developed quite significant exertional dyspnea to the point where she was short of breath walking around her house. She had a pulmonary evaluation with Dr. Elsworth Soho but no primary lung problems were identified. Patient had an ETT-myoview in 05/2009 which was negative for ischemia or infarction. Right heart cath in 05/2009 showed mildly elevated right heart filling pressures but normal PA pressure and normal PCWP. She was started her on a beta blocker (Coreg) to try to lower her LV mid-cavity gradient (that likely occurs with exertion) and to better control BP.  V/Q scan was negative for PE.  PFTs from 06/2011 showed a restrictive defect. Last echo in 06/2013 showed EF 60-65% with normal RV size and systolic function.      Admitted 12/05/13 with volume overload. Diuresed with IV lasix and transitioned to torsemide 80 mg twice a day + metolazone. Overall she diuresed 21 pounds. Discharge weight was 263 pounds.   She returns for post hospital follow up. Recent admit for urosepsis. Overall feeling ok. Denies SOB/PND/Orthopnea. Not weighing daily. Tries to follow low salt diet. Tries to limit fluid intake. Taking all medications.    Labs (10/13): K 4.1, creatinine 0.85 Labs (4/14): K 3.7, creatinine 1.2, BUN 43, BNP 23 Labs (5/14): K 4.1, creatinine 1.0 Labs (7/14): K 4.6 Creatinine 1.0 BNP 39.0  Labs (8/14): K 3.5, creatinine 0.91, BNP 71 Labs (11/14): K 3.7, creatinine 0.9 Labs (12/14): K 4.1, creatinine 0.8, BNP 52 Labs (3/15): K 4, creatinine 0.9 Labs (4/15): K 3.7, creatinine 0.9 Labs (5/15): K 3.8, creatinine 0.9, BNP  29 Labs (6/15): K 3.4, BUN 105, creatinine 0.9 =>1.7 Labs (6/17/5) K 4.2, BUN 41, Creatinine 0.80 Labs (08/19/13) K 3.2, BUN 71, creatinine 1.5 Labs 09/05/13 K 4.1 Creatinine 1.1 Labs 11/07/13 K 3.6 Creatinine 0.91 Labs 12/11/13 K 4.5 Creatine 1.05 Labs (12/15): K 3.8, creatinine 1.03 Labs (03/17/2014): K 4.1 Creatinine 0.99  Labs (4/16): K 3.7, creatinine 1.24 Labs (5/16): LDL 108, TGs 699 Labs (07/24/2014) K 3.5 Creatinine 1.17  Labs (07/31/2014) K 3.3 Creatinine 1.13 Labs 10/26/2014: K 3.1 Creatinine 0.27f Allergies (verified):  1) ! Sulfa  2) Morphine   Past Medical History:  1. Diabetes mellitus, type II  2. Hyperlipidemia: She has been unable to tolerate statins (has been on Vytorin, lovastatin, and Crestor) due to what sounds like rhabdo: developed muscle weakness and was told she had "muscle damage" with each of these statins. She has been told not to take statins anymore.  Has hypertriglyceridemia.  3. Hypertension 4. Obesity  5. Restless Leg Syndrome  6. OA  7. peripheral neuropathy  8. OHS/OSA: Uses supplemental oxygen, does not tolerate CPAP.   9. Chronic nausea/diarrhea: ? IBS  10. Diastolic CHF: Echo (37/37 with normal LV size, moderate LVH, EF 65-70%, LV mid-cavity gradient reaching 63 mmHg with valsalva but minimal at rest, grade I diastolic dysfunction, cannot estimate PA systolic pressure (no TR doppler signal), RV normal. RHC (4/11): Mean RA 11, RV 37/13, PA 33/15, mean PCWP 12, CI 3.3. Echo in 11/12 showed mild LVH, EF 65-70%, no LV mid-cavity gradient was mentioned.  Echo in 4/14 showed EF 65-70%,  mild LVH, grade I diastolic dysfunction, PA systolic pressure 35 mmHg, mild LV mid-cavity gradient.  Echo (5/15) with EF 60-65%, mild TR, normal RV size and systolic function.  11. ETT-myoview (4/11): 5'30", stopped due to fatigue, normal EF, no evidence for scar or ischemia. 12. V/Q scan 11/12: negative for PE.  Lower extremity venous doppler US (3/14): negative for DVT.  13.  PFTs (5/13): FVC 50%, FEV1 62%, ratio 106%, DLCO 69%, TLC 69%.   Family History:  Family History of Alcoholism/Addiction (parent)  Family History Diabetes 1st degree relative (grandparent)  Family History High cholesterol (parent, grandparent)  Family History Hypertension (parent, grandparent)  Family History Lung cancer (grandparent)  Stomach cancer (grandmother)  Celiac Sprue daughter  Mother with MI at 69, father with MI at 70, uncle with MI at 6, brother with stents in his 28s, multiple aunts with MIs and CVAs   Social History:  Never Smoked  no alcohol  married, lives with spouse and her mother  retired Network engineer - now housewife  Alcohol Use - no  Illicit Drug Use - no  Review of Systems  All systems reviewed and negative except as per HPI.   ROS: All systems reviewed and negative except as per HPI.   Current Outpatient Prescriptions  Medication Sig Dispense Refill  . aspirin EC 81 MG tablet Take 1 tablet (81 mg total) by mouth daily.    . B Complex-C (B-COMPLEX WITH VITAMIN C) tablet Take 1 tablet by mouth daily.    . Blood Glucose Monitoring Suppl (ONETOUCH VERIO FLEX SYSTEM) W/DEVICE KIT 1 kit by Does not apply route once. Dx Code E11.9 1 kit 0  . carvedilol (COREG) 25 MG tablet TAKE 1 TABLET BY MOUTH TWICE DAILY WITH FOOD 60 tablet 5  . cephALEXin (KEFLEX) 500 MG capsule Take 1 capsule (500 mg total) by mouth 2 (two) times daily. 6 capsule 0  . Cholecalciferol (VITAMIN D3) 2000 UNITS capsule Take 2,000 Units by mouth 4 (four) times daily - after meals and at bedtime.     . diclofenac sodium (VOLTAREN) 1 % GEL Apply 2 g topically daily as needed (knee pain). 10 g 3  . ezetimibe (ZETIA) 10 MG tablet Take 1 tablet (10 mg total) by mouth daily. 90 tablet 3  . fenofibrate 160 MG tablet TAKE 1 TABLET BY MOUTH DAILY 30 tablet 4  . gabapentin (NEURONTIN) 300 MG capsule Take 1 capsule (300 mg total) by mouth 4 (four) times daily. (Patient taking differently: Take 600 mg by mouth 4  (four) times daily. ) 120 capsule 3  . Glucosamine HCl 1000 MG TABS Take 1,000 mg by mouth 2 (two) times daily.    [name] glucose blood (ONETOUCH VERIO) test strip Test 3 time a day 100 each 3  . hydrALAZINE (APRESOLINE) 25 MG tablet TAKE 1 TABLET (25 MG TOTAL) BY MOUTH 3 (THREE) TIMES DAILY. 90 tablet 2  . hydrOXYzine (ATARAX/VISTARIL) 10 MG tablet Take 10 mg by mouth 3 (three) times daily as needed for anxiety.    . insulin NPH Human (NOVOLIN N) 100 UNIT/ML injection Inject 0.4 mLs (40 Units total) into the skin at bedtime. 20 mL 11  . insulin regular (HUMULIN R) 100 units/mL injection 3 times a day (just before each meal) 60-70-80 units (Patient taking differently: Inject 60-80 Units into the skin 3 (three) times daily before meals. 3 times a day (just before each meal) 60-70-80 units) 70 mL 11  . Insulin Syringe-Needle U-100 (B-D INSULIN SYRINGE 1CC/27G) 27G X  1/2" 1 ML MISC Use to inject insulin 4 times daily as advised. 120 each 5  . ketoconazole (NIZORAL) 2 % cream Apply 1 application topically daily. 60 g 1  . lactobacillus (FLORANEX/LACTINEX) PACK Take 1 packet (1 g total) by mouth 3 (three) times daily with meals. 30 packet 0  . Magnesium 500 MG TABS Take 500 mg by mouth daily.    . metolazone (ZAROXOLYN) 2.5 MG tablet Take 1 tablet (2.5 mg total) by mouth every other day. 20 tablet 3  . omega-3 acid ethyl esters (LOVAZA) 1 G capsule Take 1 g by mouth 2 (two) times daily.     . ONE TOUCH LANCETS MISC Use one lancet per blood sugar testing.3 times a day 200 each 1  . oxycodone (OXY-IR) 5 MG capsule Take 5 mg by mouth every 6 (six) hours.    . potassium chloride SA (K-DUR,KLOR-CON) 20 MEQ tablet Take 40 mEq by mouth 2 (two) times daily.    [name] rOPINIRole (REQUIP) 2 MG tablet Take 2 mg by mouth 2 (two) times daily.    [name] senna-docusate (SENOKOT-S) 8.6-50 MG per tablet Take 2 tablets by mouth 2 (two) times daily. 10 tablet 0  . sertraline (ZOLOFT) 25 MG tablet Take 1 tablet (25 mg total) by mouth  daily. 30 tablet 0  . spironolactone (ALDACTONE) 25 MG tablet Take 1 tablet (25 mg total) by mouth daily. 90 tablet 3  . torsemide (DEMADEX) 100 MG tablet Take 1 tablet (100 mg total) by mouth 2 (two) times daily. 60 tablet 6  . triamcinolone cream (KENALOG) 0.1 % Apply 1 application topically 2 (two) times daily. (Patient taking differently: Apply 1 application topically 2 (two) times daily as needed (for rash). ) 30 g 0   No current facility-administered medications for this encounter.   Filed Vitals:   11/11/14 1158  BP: 122/68  Pulse: 63  Weight: 271 lb (122.925 kg)  SpO2: 96%    General: NAD, obese. Husband present.. Arrived in wheelchair.   Neck: Thick, JVP 7-8.  No thyromegaly or thyroid nodule.  Lungs: Clear to auscultation bilaterally with normal respiratory effort. Chronic 2L oxygen CV: Nondisplaced PMI.  Heart regular S1/S2, no S3/S4, 2/6 SEM RUSB.    No carotid bruit.  Abdomen: Soft, nontender, no hepatosplenomegaly, obese Neurologic: Alert and oriented x 3.  Psych: Normal affect. Extremities: No clubbing/cyanosis, bilateral venous stasis.  RLE and LLE . No edema.    Assessment/Plan:  1. Chronic diastolic CHF:  Have discussed cardiomemes at previous appointments and she has decided not to pursue.  NYHA III.  Volume status stable today. Continue torsemide 100 mg twice a day + metolazone every other day.  - Continue 40 meq twice daily.  Reinforced medication and fluid compliance.  2. OHS:   - Followed by Dr Elsworth Soho. Per Dr Elsworth Soho no role for CPAP yet. Continue home oxygen. 3. Obesity: Difficult for her to exercise with knee pain.  Have encouraged her to be more active and watch her portion control.   4. CKD Stage III:    5. HTN: Stable. Continue current regimen.   6. Hyperlipidemia: Very high triglycerides and checked 07/24/14. Lovaza added and she will continue Zetia and Fenofibrate.     Follow up in 6 weeks    CLEGG,AMY NP-C  12:15 PM

## 2014-11-11 NOTE — Patient Instructions (Signed)
FOLLOW UP: 6 weeks N/P clinic w/BMET

## 2014-11-12 ENCOUNTER — Encounter: Payer: Self-pay | Admitting: Endocrinology

## 2014-11-12 ENCOUNTER — Ambulatory Visit (INDEPENDENT_AMBULATORY_CARE_PROVIDER_SITE_OTHER): Payer: Medicare Other | Admitting: Endocrinology

## 2014-11-12 VITALS — BP 136/60 | HR 67 | Temp 98.4°F

## 2014-11-12 DIAGNOSIS — E1129 Type 2 diabetes mellitus with other diabetic kidney complication: Secondary | ICD-10-CM | POA: Diagnosis not present

## 2014-11-12 DIAGNOSIS — I5032 Chronic diastolic (congestive) heart failure: Secondary | ICD-10-CM | POA: Diagnosis not present

## 2014-11-12 DIAGNOSIS — IMO0002 Reserved for concepts with insufficient information to code with codable children: Secondary | ICD-10-CM

## 2014-11-12 DIAGNOSIS — E1165 Type 2 diabetes mellitus with hyperglycemia: Secondary | ICD-10-CM

## 2014-11-12 DIAGNOSIS — E1029 Type 1 diabetes mellitus with other diabetic kidney complication: Secondary | ICD-10-CM

## 2014-11-12 DIAGNOSIS — E785 Hyperlipidemia, unspecified: Secondary | ICD-10-CM | POA: Diagnosis not present

## 2014-11-12 DIAGNOSIS — E1122 Type 2 diabetes mellitus with diabetic chronic kidney disease: Secondary | ICD-10-CM | POA: Diagnosis not present

## 2014-11-12 DIAGNOSIS — I129 Hypertensive chronic kidney disease with stage 1 through stage 4 chronic kidney disease, or unspecified chronic kidney disease: Secondary | ICD-10-CM | POA: Diagnosis not present

## 2014-11-12 DIAGNOSIS — R634 Abnormal weight loss: Secondary | ICD-10-CM

## 2014-11-12 DIAGNOSIS — E1065 Type 1 diabetes mellitus with hyperglycemia: Principal | ICD-10-CM

## 2014-11-12 DIAGNOSIS — N183 Chronic kidney disease, stage 3 (moderate): Secondary | ICD-10-CM | POA: Diagnosis not present

## 2014-11-12 DIAGNOSIS — N390 Urinary tract infection, site not specified: Secondary | ICD-10-CM | POA: Diagnosis not present

## 2014-11-12 MED ORDER — INSULIN REGULAR HUMAN 100 UNIT/ML IJ SOLN: 70.0000 [IU] | Freq: Three times a day (TID) | INTRAMUSCULAR | Status: DC

## 2014-11-12 NOTE — Progress Notes (Signed)
Subjective:    Patient ID: Melissa Myers, female    DOB: [birth year], 69 y.o.   MRN: [id number]  HPI Pt returns for f/u of diabetes mellitus: DM type: Insulin-requiring type 2.   Dx'ed: 9449 Complications: sensory neuropathy of the lower extremities, renal insufficiency, and leg ulcers Therapy: insulin since 2011 GDM: never DKA: never Severe hypoglycemia: never.   Pancreatitis: never Other: takes multiple daily injections; she is too ill to undergo weight loss surgery.   Interval history: I asked husband about hypoglycemia, who says: 1 week ago, pt had an episodes of severe hypoglycemia at 5 AM.  She therefore stopped taking the NPH.  She takes reg insulin, 3 times a day (just before each meal) 60-70-80 units.  Even off the NPH, she is having cbg's as low as 57 in the middle of the night.  It is highest at lunch and in the afternoon.  Past Medical History  Diagnosis Date  . Type II or unspecified type diabetes mellitus without mention of complication, not stated as uncontrolled     insulin dep  . Hyperlipidemia     hx rhabdo on statins  . Hypertension   . Diastolic CHF   . Chronic diarrhea     a/w nausea - felt related to IBS  . RLS (restless legs syndrome)   . Osteoarthritis   . Stasis dermatitis   . GERD (gastroesophageal reflux disease)   . On home oxygen therapy     uses oxygen 2 liters min per [place] at night and prn during day  . Anemia   . Neuropathy     feet, toes and fingers  . Disc degeneration, lumbar   . OSA (obstructive sleep apnea)     05/2009 sleep study - refuses CPAP  . Deaf     left side only  . Shortness of breath     chronic    Past Surgical History  Procedure Laterality Date  . Tubal ligation  1980  . Cholecystectomy  1997  . Uterine polyp removal  2008  . Umbilical hernia repair  1995  . Tonsillectomy  1970  . Colonoscopy N/A 12/03/2012    Procedure: COLONOSCOPY;  Surgeon: Lafayette Dragon, MD;  Location: [facility];  Service: Endoscopy;   Laterality: N/A;    Social History   Social History  . Marital Status: Married    Spouse Name: N/A  . Number of Children: N/A  . Years of Education: N/A   Occupational History  . Not on file.   Social History Main Topics  . Smoking status: Never Smoker   . Smokeless tobacco: Never Used  . Alcohol Use: No  . Drug Use: No  . Sexual Activity: No   Other Topics Concern  . Not on file   Social History Narrative   Lives with spouse and mother. Retired Network engineer, now housewife    Current Outpatient Prescriptions on File Prior to Visit  Medication Sig Dispense Refill  . aspirin EC 81 MG tablet Take 1 tablet (81 mg total) by mouth daily.    . B Complex-C (B-COMPLEX WITH VITAMIN C) tablet Take 1 tablet by mouth daily.    . Blood Glucose Monitoring Suppl (ONETOUCH VERIO FLEX SYSTEM) W/DEVICE KIT 1 kit by Does not apply route once. Dx Code E11.9 1 kit 0  . carvedilol (COREG) 25 MG tablet TAKE 1 TABLET BY MOUTH TWICE DAILY WITH FOOD 60 tablet 5  . Cholecalciferol (VITAMIN D3) 2000 UNITS capsule Take 2,000 Units by  mouth 4 (four) times daily - after meals and at bedtime.     . diclofenac sodium (VOLTAREN) 1 % GEL Apply 2 g topically daily as needed (knee pain). 10 g 3  . ezetimibe (ZETIA) 10 MG tablet Take 1 tablet (10 mg total) by mouth daily. 90 tablet 3  . fenofibrate 160 MG tablet TAKE 1 TABLET BY MOUTH DAILY 30 tablet 4  . gabapentin (NEURONTIN) 300 MG capsule Take 1 capsule (300 mg total) by mouth 4 (four) times daily. (Patient taking differently: Take 600 mg by mouth 4 (four) times daily. ) 120 capsule 3  . Glucosamine HCl 1000 MG TABS Take 1,000 mg by mouth 2 (two) times daily.    [name] glucose blood (ONETOUCH VERIO) test strip Test 3 time a day 100 each 3  . hydrALAZINE (APRESOLINE) 25 MG tablet TAKE 1 TABLET (25 MG TOTAL) BY MOUTH 3 (THREE) TIMES DAILY. 90 tablet 2  . hydrOXYzine (ATARAX/VISTARIL) 10 MG tablet Take 10 mg by mouth 3 (three) times daily as needed for anxiety.    .  Insulin Syringe-Needle U-100 (B-D INSULIN SYRINGE 1CC/27G) 27G X 1/2" 1 ML MISC Use to inject insulin 4 times daily as advised. 120 each 5  . ketoconazole (NIZORAL) 2 % cream Apply 1 application topically daily. 60 g 1  . lactobacillus (FLORANEX/LACTINEX) PACK Take 1 packet (1 g total) by mouth 3 (three) times daily with meals. 30 packet 0  . Magnesium 500 MG TABS Take 500 mg by mouth daily.    . metolazone (ZAROXOLYN) 2.5 MG tablet Take 1 tablet (2.5 mg total) by mouth every other day. 20 tablet 3  . omega-3 acid ethyl esters (LOVAZA) 1 G capsule Take 1 g by mouth 2 (two) times daily.     . ONE TOUCH LANCETS MISC Use one lancet per blood sugar testing.3 times a day 200 each 1  . oxycodone (OXY-IR) 5 MG capsule Take 5 mg by mouth every 6 (six) hours.    . potassium chloride SA (K-DUR,KLOR-CON) 20 MEQ tablet Take 40 mEq by mouth 2 (two) times daily.    [name] rOPINIRole (REQUIP) 2 MG tablet Take 2 mg by mouth 2 (two) times daily.    [name] senna-docusate (SENOKOT-S) 8.6-50 MG per tablet Take 2 tablets by mouth 2 (two) times daily. 10 tablet 0  . sertraline (ZOLOFT) 25 MG tablet Take 1 tablet (25 mg total) by mouth daily. 30 tablet 0  . spironolactone (ALDACTONE) 25 MG tablet Take 1 tablet (25 mg total) by mouth daily. 90 tablet 3  . torsemide (DEMADEX) 100 MG tablet Take 1 tablet (100 mg total) by mouth 2 (two) times daily. 60 tablet 6  . triamcinolone cream (KENALOG) 0.1 % Apply 1 application topically 2 (two) times daily. (Patient taking differently: Apply 1 application topically 2 (two) times daily as needed (for rash). ) 30 g 0   No current facility-administered medications on file prior to visit.    Allergies  Allergen Reactions  . Cymbalta [Duloxetine Hcl] Other (See Comments)    Restless leg syndrome  . Statins Other (See Comments)    Statin drugs cause muscle pain / "muscle damage"--was told by MD not to take  . Sulfa Antibiotics Diarrhea  . Levemir [Insulin Detemir] Itching  . Morphine Other  (See Comments)    GI upset and headaches  . Zinc Swelling and Rash    Family History  Problem Relation Age of Onset  . Heart disease Mother   . Heart disease Father   .  Heart disease      family history  . Stomach cancer Paternal Grandmother   . Lung cancer Paternal Grandfather   . Colon cancer Neg Hx   . CVA      several aunts  . Heart attack Mother 46  . Heart attack Father 65  . Heart attack      several aunts and an uncle    BP 136/60 mmHg  Pulse 67  Temp(Src) 98.4 F (36.9 C) (Oral)  SpO2 97%    Review of Systems She has lost 15 lbs.      Objective:   Physical Exam VITAL SIGNS:  See vs page GENERAL: no distress.  Morbid obesity.  In wheelchair Pulses: dorsalis pedis intact bilat.   MSK: no deformity of the feet CV: 1+ bilat leg edema Skin:  no ulcer on the feet.  normal temp on the feet.  The legs are hyperpigmented.   Neuro: sensation is intact to touch on the feet, but decreased on the feet Ext: There is bilateral onychomycosis of the toenails.      Lab Results  Component Value Date   HGBA1C 8.7* 10/23/2014      Assessment & Plan:  DM: Based on the pattern of her cbg's, she needs some adjustment in her therapy Weight loss: new.  This is at least partially due to diuresis, but this may also be the cause for her decreased insulin requirement. Severe hypoglycemia: this limits glycemic control  Patient is advised the following: Patient Instructions  check your blood sugar twice a day.  vary the time of day when you check, between before the 3 meals, and at bedtime.  also check if you have symptoms of your blood sugar being too high or too low.  please keep a record of the readings and bring it to your next appointment here.  You can write it on any piece of paper.  please call us sooner if your blood sugar goes below 70, or if you have a lot of readings over 200.  Please come back for a follow-up appointment in 2 months.  For now, please stay off the NPH  insulin, and:  change the reg insulin to 70 units 3 times a day (just before each meal).

## 2014-11-12 NOTE — Patient Instructions (Addendum)
check your blood sugar twice a day.  vary the time of day when you check, between before the 3 meals, and at bedtime.  also check if you have symptoms of your blood sugar being too high or too low.  please keep a record of the readings and bring it to your next appointment here.  You can write it on any piece of paper.  please call us sooner if your blood sugar goes below 70, or if you have a lot of readings over 200.  Please come back for a follow-up appointment in 2 months.  For now, please stay off the NPH insulin, and:  change the reg insulin to 70 units 3 times a day (just before each meal).

## 2014-11-13 DIAGNOSIS — E785 Hyperlipidemia, unspecified: Secondary | ICD-10-CM | POA: Diagnosis not present

## 2014-11-13 DIAGNOSIS — N183 Chronic kidney disease, stage 3 (moderate): Secondary | ICD-10-CM | POA: Diagnosis not present

## 2014-11-13 DIAGNOSIS — E1122 Type 2 diabetes mellitus with diabetic chronic kidney disease: Secondary | ICD-10-CM | POA: Diagnosis not present

## 2014-11-13 DIAGNOSIS — Z79899 Other long term (current) drug therapy: Secondary | ICD-10-CM | POA: Diagnosis not present

## 2014-11-13 DIAGNOSIS — M1990 Unspecified osteoarthritis, unspecified site: Secondary | ICD-10-CM | POA: Diagnosis not present

## 2014-11-13 DIAGNOSIS — I5032 Chronic diastolic (congestive) heart failure: Secondary | ICD-10-CM | POA: Diagnosis not present

## 2014-11-13 DIAGNOSIS — N390 Urinary tract infection, site not specified: Secondary | ICD-10-CM | POA: Diagnosis not present

## 2014-11-13 DIAGNOSIS — M1710 Unilateral primary osteoarthritis, unspecified knee: Secondary | ICD-10-CM | POA: Diagnosis not present

## 2014-11-13 DIAGNOSIS — G894 Chronic pain syndrome: Secondary | ICD-10-CM | POA: Diagnosis not present

## 2014-11-13 DIAGNOSIS — M79673 Pain in unspecified foot: Secondary | ICD-10-CM | POA: Diagnosis not present

## 2014-11-13 DIAGNOSIS — I129 Hypertensive chronic kidney disease with stage 1 through stage 4 chronic kidney disease, or unspecified chronic kidney disease: Secondary | ICD-10-CM | POA: Diagnosis not present

## 2014-11-14 ENCOUNTER — Inpatient Hospital Stay (HOSPITAL_COMMUNITY)
Admission: EM | Admit: 2014-11-14 | Discharge: 2014-11-20 | DRG: 871 | Disposition: A | Discharge status: Home Health | Payer: Medicare Other | Attending: Internal Medicine | Admitting: Internal Medicine

## 2014-11-14 ENCOUNTER — Emergency Department (HOSPITAL_COMMUNITY): Payer: Medicare Other

## 2014-11-14 ENCOUNTER — Encounter (HOSPITAL_COMMUNITY): Payer: Self-pay | Admitting: *Deleted

## 2014-11-14 DIAGNOSIS — T8384XA Pain from genitourinary prosthetic devices, implants and grafts, initial encounter: Secondary | ICD-10-CM | POA: Diagnosis not present

## 2014-11-14 DIAGNOSIS — Z79899 Other long term (current) drug therapy: Secondary | ICD-10-CM

## 2014-11-14 DIAGNOSIS — L03116 Cellulitis of left lower limb: Secondary | ICD-10-CM | POA: Diagnosis present

## 2014-11-14 DIAGNOSIS — L97919 Non-pressure chronic ulcer of unspecified part of right lower leg with unspecified severity: Secondary | ICD-10-CM

## 2014-11-14 DIAGNOSIS — I83029 Varicose veins of left lower extremity with ulcer of unspecified site: Secondary | ICD-10-CM

## 2014-11-14 DIAGNOSIS — G92 Toxic encephalopathy: Secondary | ICD-10-CM | POA: Diagnosis present

## 2014-11-14 DIAGNOSIS — L97929 Non-pressure chronic ulcer of unspecified part of left lower leg with unspecified severity: Secondary | ICD-10-CM

## 2014-11-14 DIAGNOSIS — I5032 Chronic diastolic (congestive) heart failure: Secondary | ICD-10-CM | POA: Diagnosis not present

## 2014-11-14 DIAGNOSIS — N390 Urinary tract infection, site not specified: Secondary | ICD-10-CM | POA: Diagnosis not present

## 2014-11-14 DIAGNOSIS — Z888 Allergy status to other drugs, medicaments and biological substances status: Secondary | ICD-10-CM

## 2014-11-14 DIAGNOSIS — N179 Acute kidney failure, unspecified: Secondary | ICD-10-CM | POA: Diagnosis not present

## 2014-11-14 DIAGNOSIS — A419 Sepsis, unspecified organism: Secondary | ICD-10-CM | POA: Diagnosis not present

## 2014-11-14 DIAGNOSIS — I872 Venous insufficiency (chronic) (peripheral): Secondary | ICD-10-CM | POA: Diagnosis present

## 2014-11-14 DIAGNOSIS — E876 Hypokalemia: Secondary | ICD-10-CM | POA: Diagnosis present

## 2014-11-14 DIAGNOSIS — R6520 Severe sepsis without septic shock: Secondary | ICD-10-CM | POA: Diagnosis present

## 2014-11-14 DIAGNOSIS — G4730 Sleep apnea, unspecified: Secondary | ICD-10-CM | POA: Diagnosis present

## 2014-11-14 DIAGNOSIS — I878 Other specified disorders of veins: Secondary | ICD-10-CM | POA: Diagnosis present

## 2014-11-14 DIAGNOSIS — I959 Hypotension, unspecified: Secondary | ICD-10-CM | POA: Diagnosis not present

## 2014-11-14 DIAGNOSIS — A409 Streptococcal sepsis, unspecified: Principal | ICD-10-CM | POA: Diagnosis present

## 2014-11-14 DIAGNOSIS — G2581 Restless legs syndrome: Secondary | ICD-10-CM | POA: Diagnosis present

## 2014-11-14 DIAGNOSIS — E872 Acidosis: Secondary | ICD-10-CM | POA: Diagnosis present

## 2014-11-14 DIAGNOSIS — E1165 Type 2 diabetes mellitus with hyperglycemia: Secondary | ICD-10-CM | POA: Diagnosis present

## 2014-11-14 DIAGNOSIS — G928 Other toxic encephalopathy: Secondary | ICD-10-CM | POA: Diagnosis present

## 2014-11-14 DIAGNOSIS — R6521 Severe sepsis with septic shock: Secondary | ICD-10-CM | POA: Diagnosis not present

## 2014-11-14 DIAGNOSIS — Z885 Allergy status to narcotic agent status: Secondary | ICD-10-CM

## 2014-11-14 DIAGNOSIS — E669 Obesity, unspecified: Secondary | ICD-10-CM | POA: Diagnosis present

## 2014-11-14 DIAGNOSIS — Z823 Family history of stroke: Secondary | ICD-10-CM

## 2014-11-14 DIAGNOSIS — Z800 Family history of malignant neoplasm of digestive organs: Secondary | ICD-10-CM

## 2014-11-14 DIAGNOSIS — H9192 Unspecified hearing loss, left ear: Secondary | ICD-10-CM | POA: Diagnosis present

## 2014-11-14 DIAGNOSIS — K589 Irritable bowel syndrome without diarrhea: Secondary | ICD-10-CM | POA: Diagnosis present

## 2014-11-14 DIAGNOSIS — J9600 Acute respiratory failure, unspecified whether with hypoxia or hypercapnia: Secondary | ICD-10-CM

## 2014-11-14 DIAGNOSIS — I83019 Varicose veins of right lower extremity with ulcer of unspecified site: Secondary | ICD-10-CM | POA: Diagnosis present

## 2014-11-14 DIAGNOSIS — Z794 Long term (current) use of insulin: Secondary | ICD-10-CM

## 2014-11-14 DIAGNOSIS — E119 Type 2 diabetes mellitus without complications: Secondary | ICD-10-CM | POA: Diagnosis present

## 2014-11-14 DIAGNOSIS — Z882 Allergy status to sulfonamides status: Secondary | ICD-10-CM

## 2014-11-14 DIAGNOSIS — Z801 Family history of malignant neoplasm of trachea, bronchus and lung: Secondary | ICD-10-CM

## 2014-11-14 DIAGNOSIS — E662 Morbid (severe) obesity with alveolar hypoventilation: Secondary | ICD-10-CM | POA: Diagnosis present

## 2014-11-14 DIAGNOSIS — E785 Hyperlipidemia, unspecified: Secondary | ICD-10-CM | POA: Diagnosis present

## 2014-11-14 DIAGNOSIS — E1142 Type 2 diabetes mellitus with diabetic polyneuropathy: Secondary | ICD-10-CM | POA: Diagnosis present

## 2014-11-14 DIAGNOSIS — Z9981 Dependence on supplemental oxygen: Secondary | ICD-10-CM

## 2014-11-14 DIAGNOSIS — M7989 Other specified soft tissue disorders: Secondary | ICD-10-CM

## 2014-11-14 DIAGNOSIS — M1990 Unspecified osteoarthritis, unspecified site: Secondary | ICD-10-CM | POA: Diagnosis present

## 2014-11-14 DIAGNOSIS — R509 Fever, unspecified: Secondary | ICD-10-CM | POA: Diagnosis not present

## 2014-11-14 DIAGNOSIS — Z6841 Body Mass Index (BMI) 40.0 and over, adult: Secondary | ICD-10-CM

## 2014-11-14 DIAGNOSIS — I10 Essential (primary) hypertension: Secondary | ICD-10-CM | POA: Diagnosis present

## 2014-11-14 DIAGNOSIS — N183 Chronic kidney disease, stage 3 (moderate): Secondary | ICD-10-CM | POA: Diagnosis not present

## 2014-11-14 DIAGNOSIS — I129 Hypertensive chronic kidney disease with stage 1 through stage 4 chronic kidney disease, or unspecified chronic kidney disease: Secondary | ICD-10-CM | POA: Diagnosis not present

## 2014-11-14 DIAGNOSIS — E1122 Type 2 diabetes mellitus with diabetic chronic kidney disease: Secondary | ICD-10-CM | POA: Diagnosis not present

## 2014-11-14 DIAGNOSIS — Z8249 Family history of ischemic heart disease and other diseases of the circulatory system: Secondary | ICD-10-CM

## 2014-11-14 DIAGNOSIS — Z7982 Long term (current) use of aspirin: Secondary | ICD-10-CM

## 2014-11-14 DIAGNOSIS — R600 Localized edema: Secondary | ICD-10-CM

## 2014-11-14 DIAGNOSIS — B9620 Unspecified Escherichia coli [E. coli] as the cause of diseases classified elsewhere: Secondary | ICD-10-CM | POA: Diagnosis present

## 2014-11-14 DIAGNOSIS — Y846 Urinary catheterization as the cause of abnormal reaction of the patient, or of later complication, without mention of misadventure at the time of the procedure: Secondary | ICD-10-CM | POA: Diagnosis not present

## 2014-11-14 DIAGNOSIS — Z79891 Long term (current) use of opiate analgesic: Secondary | ICD-10-CM

## 2014-11-14 DIAGNOSIS — A401 Sepsis due to streptococcus, group B: Secondary | ICD-10-CM | POA: Diagnosis present

## 2014-11-14 DIAGNOSIS — K219 Gastro-esophageal reflux disease without esophagitis: Secondary | ICD-10-CM | POA: Diagnosis present

## 2014-11-14 LAB — URINALYSIS, ROUTINE W REFLEX MICROSCOPIC
BILIRUBIN URINE: NEGATIVE
Glucose, UA: NEGATIVE mg/dL
Hgb urine dipstick: NEGATIVE
Ketones, ur: NEGATIVE mg/dL
Leukocytes, UA: NEGATIVE
NITRITE: NEGATIVE
PROTEIN: NEGATIVE mg/dL
SPECIFIC GRAVITY, URINE: 1.007 (ref 1.005–1.030)
UROBILINOGEN UA: 0.2 mg/dL (ref 0.0–1.0)
pH: 6.5 (ref 5.0–8.0)

## 2014-11-14 LAB — I-STAT CHEM 8, ED
BUN: 29 mg/dL — AB (ref 6–20)
CHLORIDE: 94 mmol/L — AB (ref 101–111)
Calcium, Ion: 1.03 mmol/L — ABNORMAL LOW (ref 1.13–1.30)
Creatinine, Ser: 1.2 mg/dL — ABNORMAL HIGH (ref 0.44–1.00)
Glucose, Bld: 174 mg/dL — ABNORMAL HIGH (ref 65–99)
HEMATOCRIT: 42 % (ref 36.0–46.0)
Hemoglobin: 14.3 g/dL (ref 12.0–15.0)
Potassium: 3.9 mmol/L (ref 3.5–5.1)
SODIUM: 139 mmol/L (ref 135–145)
TCO2: 35 mmol/L (ref 0–100)

## 2014-11-14 LAB — CBC WITH DIFFERENTIAL/PLATELET
BASOS ABS: 0 10*3/uL (ref 0.0–0.1)
Basophils Relative: 0 %
EOS PCT: 1 %
Eosinophils Absolute: 0.1 10*3/uL (ref 0.0–0.7)
HCT: 39.8 % (ref 36.0–46.0)
Hemoglobin: 12.4 g/dL (ref 12.0–15.0)
LYMPHS ABS: 1.6 10*3/uL (ref 0.7–4.0)
LYMPHS PCT: 11 %
MCH: 26.9 pg (ref 26.0–34.0)
MCHC: 31.2 g/dL (ref 30.0–36.0)
MCV: 86.3 fL (ref 78.0–100.0)
MONO ABS: 0.5 10*3/uL (ref 0.1–1.0)
Monocytes Relative: 4 %
Neutro Abs: 12.5 10*3/uL — ABNORMAL HIGH (ref 1.7–7.7)
Neutrophils Relative %: 84 %
Platelets: 312 10*3/uL (ref 150–400)
RBC: 4.61 MIL/uL (ref 3.87–5.11)
RDW: 15.7 % — AB (ref 11.5–15.5)
WBC: 14.8 10*3/uL — ABNORMAL HIGH (ref 4.0–10.5)

## 2014-11-14 LAB — COMPREHENSIVE METABOLIC PANEL
ALT: 24 U/L (ref 14–54)
AST: 41 U/L (ref 15–41)
Albumin: 3.9 g/dL (ref 3.5–5.0)
Alkaline Phosphatase: 67 U/L (ref 38–126)
Anion gap: 14 (ref 5–15)
BUN: 27 mg/dL — ABNORMAL HIGH (ref 6–20)
CHLORIDE: 93 mmol/L — AB (ref 101–111)
CO2: 35 mmol/L — ABNORMAL HIGH (ref 22–32)
Calcium: 10 mg/dL (ref 8.9–10.3)
Creatinine, Ser: 1.27 mg/dL — ABNORMAL HIGH (ref 0.44–1.00)
GFR, EST AFRICAN AMERICAN: 49 mL/min — AB (ref >60)
GFR, EST NON AFRICAN AMERICAN: 42 mL/min — AB (ref >60)
Glucose, Bld: 172 mg/dL — ABNORMAL HIGH (ref 65–99)
POTASSIUM: 4 mmol/L (ref 3.5–5.1)
Sodium: 142 mmol/L (ref 135–145)
Total Bilirubin: 0.5 mg/dL (ref 0.3–1.2)
Total Protein: 8 g/dL (ref 6.5–8.1)

## 2014-11-14 LAB — I-STAT CG4 LACTIC ACID, ED: Lactic Acid, Venous: 5.66 mmol/L (ref 0.5–2.0)

## 2014-11-14 MED ORDER — SODIUM CHLORIDE 0.9 % IV BOLUS (SEPSIS)
1000.0000 mL | Freq: Once | INTRAVENOUS | Status: AC
  Administered 2014-11-14: 1000 mL via INTRAVENOUS

## 2014-11-14 MED ORDER — ACETAMINOPHEN 500 MG PO TABS
1000.0000 mg | ORAL_TABLET | Freq: Once | ORAL | Status: DC
Start: 2014-11-14 — End: 2014-11-20

## 2014-11-14 MED ORDER — IOHEXOL 300 MG/ML  SOLN
100.0000 mL | Freq: Once | INTRAMUSCULAR | Status: AC | PRN
  Administered 2014-11-15: 100 mL via INTRAVENOUS

## 2014-11-14 MED ORDER — SODIUM CHLORIDE 0.9 % IV SOLN
100.0000 mg | INTRAVENOUS | Status: DC
  Administered 2014-11-16: 100 mg via INTRAVENOUS
  Filled 2014-11-14: qty 100

## 2014-11-14 MED ORDER — VANCOMYCIN HCL 10 G IV SOLR
1500.0000 mg | INTRAVENOUS | Status: DC
  Administered 2014-11-16 (×2): 1500 mg via INTRAVENOUS
  Filled 2014-11-14 (×2): qty 1500

## 2014-11-14 MED ORDER — ACETAMINOPHEN 325 MG RE SUPP
RECTAL | Status: AC
  Administered 2014-11-14: 975 mg via RECTAL
  Filled 2014-11-14: qty 2

## 2014-11-14 MED ORDER — SODIUM CHLORIDE 0.9 % IV BOLUS (SEPSIS)
500.0000 mL | Freq: Once | INTRAVENOUS | Status: AC
  Administered 2014-11-14: 500 mL via INTRAVENOUS

## 2014-11-14 MED ORDER — SODIUM CHLORIDE 0.9 % IV SOLN
200.0000 mg | Freq: Once | INTRAVENOUS | Status: AC
  Administered 2014-11-15: 200 mg via INTRAVENOUS
  Filled 2014-11-14: qty 200

## 2014-11-14 MED ORDER — FENTANYL CITRATE (PF) 100 MCG/2ML IJ SOLN
50.0000 ug | Freq: Once | INTRAMUSCULAR | Status: AC
  Administered 2014-11-14: 50 ug via INTRAVENOUS
  Filled 2014-11-14: qty 2

## 2014-11-14 MED ORDER — CLINDAMYCIN PHOSPHATE 900 MG/50ML IV SOLN
900.0000 mg | Freq: Three times a day (TID) | INTRAVENOUS | Status: DC
  Administered 2014-11-15 – 2014-11-16 (×5): 900 mg via INTRAVENOUS
  Filled 2014-11-14 (×5): qty 50

## 2014-11-14 MED ORDER — VANCOMYCIN HCL 10 G IV SOLR
2000.0000 mg | INTRAVENOUS | Status: AC
  Administered 2014-11-14: 2000 mg via INTRAVENOUS
  Filled 2014-11-14: qty 2000

## 2014-11-14 MED ORDER — PIPERACILLIN-TAZOBACTAM 3.375 G IVPB 30 MIN
3.3750 g | INTRAVENOUS | Status: AC
  Administered 2014-11-14: 3.375 g via INTRAVENOUS
  Filled 2014-11-14: qty 50

## 2014-11-14 MED ORDER — PIPERACILLIN-TAZOBACTAM 3.375 G IVPB
3.3750 g | Freq: Three times a day (TID) | INTRAVENOUS | Status: DC
  Administered 2014-11-15 – 2014-11-17 (×7): 3.375 g via INTRAVENOUS
  Filled 2014-11-14 (×7): qty 50

## 2014-11-14 NOTE — ED Notes (Signed)
Dr Alvino Chapel aware of blood pressure

## 2014-11-14 NOTE — ED Provider Notes (Signed)
CSN: [id number]     Arrival date & time 11/14/14  2131 History   First MD Initiated Contact with Patient 11/14/14 2139     Chief Complaint  Patient presents with  . Dysuria  . Flank Pain  . Fever     (Consider location/radiation/quality/duration/timing/severity/associated sxs/prior Treatment) Patient is a 69 y.o. female presenting with dysuria, flank pain, and fever. The history is provided by the patient.  Dysuria Associated symptoms: fever and flank pain   Flank Pain Pertinent negatives include no shortness of breath.  Fever Associated symptoms: dysuria    level V caveat due to altered mental status Patient comes in with fever and altered mental status. Recent admission for sepsis with UTI. Reportedly became more altered today. Fever was reportedly 101 for EMS. Patient cannot give me much history.  Past Medical History  Diagnosis Date  . Type II or unspecified type diabetes mellitus without mention of complication, not stated as uncontrolled     insulin dep  . Hyperlipidemia     hx rhabdo on statins  . Hypertension   . Diastolic CHF   . Chronic diarrhea     a/w nausea - felt related to IBS  . RLS (restless legs syndrome)   . Osteoarthritis   . Stasis dermatitis   . GERD (gastroesophageal reflux disease)   . On home oxygen therapy     uses oxygen 2 liters min per [place] at night and prn during day  . Anemia   . Neuropathy     feet, toes and fingers  . Disc degeneration, lumbar   . OSA (obstructive sleep apnea)     05/2009 sleep study - refuses CPAP  . Deaf     left side only  . Shortness of breath     chronic   Past Surgical History  Procedure Laterality Date  . Tubal ligation  1980  . Cholecystectomy  1997  . Uterine polyp removal  2008  . Umbilical hernia repair  1995  . Tonsillectomy  1970  . Colonoscopy N/A 12/03/2012    Procedure: COLONOSCOPY;  Surgeon: Lafayette Dragon, MD;  Location: [facility];  Service: Endoscopy;  Laterality: N/A;   Family History   Problem Relation Age of Onset  . Heart disease Mother   . Heart disease Father   . Heart disease      family history  . Stomach cancer Paternal Grandmother   . Lung cancer Paternal Grandfather   . Colon cancer Neg Hx   . CVA      several aunts  . Heart attack Mother 40  . Heart attack Father 36  . Heart attack      several aunts and an uncle   Social History  Substance Use Topics  . Smoking status: Never Smoker   . Smokeless tobacco: Never Used  . Alcohol Use: No   OB History    No data available     Review of Systems  Unable to perform ROS Constitutional: Positive for fever.  Respiratory: Negative for chest tightness and shortness of breath.   Genitourinary: Positive for dysuria and flank pain.      Allergies  Cymbalta; Statins; Sulfa antibiotics; Levemir; Morphine; and Zinc  Home Medications   Prior to Admission medications   Medication Sig Start Date End Date Taking? Authorizing Provider  aspirin EC 81 MG tablet Take 1 tablet (81 mg total) by mouth daily. 07/08/14   Rowe Clack, MD  B Complex-C (B-COMPLEX WITH VITAMIN C) tablet Take  1 tablet by mouth daily.    Historical Provider, MD  Blood Glucose Monitoring Suppl (ONETOUCH VERIO FLEX SYSTEM) W/DEVICE KIT 1 kit by Does not apply route once. Dx Code E11.9 10/23/14   Renato Shin, MD  carvedilol (COREG) 25 MG tablet TAKE 1 TABLET BY MOUTH TWICE DAILY WITH FOOD 11/06/14   Jolaine Artist, MD  Cholecalciferol (VITAMIN D3) 2000 UNITS capsule Take 2,000 Units by mouth 4 (four) times daily - after meals and at bedtime.     Historical Provider, MD  diclofenac sodium (VOLTAREN) 1 % GEL Apply 2 g topically daily as needed (knee pain). 10/26/14   Florencia Reasons, MD  ezetimibe (ZETIA) 10 MG tablet Take 1 tablet (10 mg total) by mouth daily. 07/08/14   Rowe Clack, MD  fenofibrate 160 MG tablet TAKE 1 TABLET BY MOUTH DAILY 09/04/14   Rowe Clack, MD  gabapentin (NEURONTIN) 300 MG capsule Take 1 capsule (300 mg  total) by mouth 4 (four) times daily. Patient taking differently: Take 600 mg by mouth 4 (four) times daily.  07/08/14   Rowe Clack, MD  Glucosamine HCl 1000 MG TABS Take 1,000 mg by mouth 2 (two) times daily.    Historical Provider, MD  glucose blood (ONETOUCH VERIO) test strip Test 3 time a day 10/22/14   Renato Shin, MD  hydrALAZINE (APRESOLINE) 25 MG tablet TAKE 1 TABLET (25 MG TOTAL) BY MOUTH 3 (THREE) TIMES DAILY. 10/28/14   Jolaine Artist, MD  hydrOXYzine (ATARAX/VISTARIL) 10 MG tablet Take 10 mg by mouth 3 (three) times daily as needed for anxiety.    Historical Provider, MD  insulin regular (HUMULIN R) 100 units/mL injection Inject 0.7 mLs (70 Units total) into the skin 3 (three) times daily before meals. 11/12/14   Renato Shin, MD  Insulin Syringe-Needle U-100 (B-D INSULIN SYRINGE 1CC/27G) 27G X 1/2" 1 ML MISC Use to inject insulin 4 times daily as advised. 06/02/14   Elayne Snare, MD  ketoconazole (NIZORAL) 2 % cream Apply 1 application topically daily. 11/05/14   Hendricks Limes, MD  lactobacillus (FLORANEX/LACTINEX) PACK Take 1 packet (1 g total) by mouth 3 (three) times daily with meals. 10/26/14   Florencia Reasons, MD  Magnesium 500 MG TABS Take 500 mg by mouth daily.    Historical Provider, MD  metolazone (ZAROXOLYN) 2.5 MG tablet Take 1 tablet (2.5 mg total) by mouth every other day. 07/24/14   Larey Dresser, MD  omega-3 acid ethyl esters (LOVAZA) 1 G capsule Take 1 g by mouth 2 (two) times daily.     Historical Provider, MD  ONE TOUCH LANCETS MISC Use one lancet per blood sugar testing.3 times a day 10/22/14   Renato Shin, MD  oxycodone (OXY-IR) 5 MG capsule Take 5 mg by mouth every 6 (six) hours.    Historical Provider, MD  potassium chloride SA (K-DUR,KLOR-CON) 20 MEQ tablet Take 40 mEq by mouth 2 (two) times daily.    Historical Provider, MD  rOPINIRole (REQUIP) 2 MG tablet Take 2 mg by mouth 2 (two) times daily.    Historical Provider, MD  senna-docusate (SENOKOT-S) 8.6-50 MG per  tablet Take 2 tablets by mouth 2 (two) times daily. 10/26/14   Florencia Reasons, MD  sertraline (ZOLOFT) 25 MG tablet Take 1 tablet (25 mg total) by mouth daily. 10/26/14   Florencia Reasons, MD  spironolactone (ALDACTONE) 25 MG tablet Take 1 tablet (25 mg total) by mouth daily. 07/08/14   Rowe Clack, MD  torsemide (  DEMADEX) 100 MG tablet Take 1 tablet (100 mg total) by mouth 2 (two) times daily. 10/10/14   Larey Dresser, MD  triamcinolone cream (KENALOG) 0.1 % Apply 1 application topically 2 (two) times daily. Patient taking differently: Apply 1 application topically 2 (two) times daily as needed (for rash).  10/09/14   Golden Circle, FNP   BP 114/48 mmHg  Pulse 91  Temp(Src) 105 F (40.6 C) (Core (Comment))  Resp 22  SpO2 94% Physical Exam  Constitutional: She appears well-developed.  HENT:  Head: Atraumatic.  Eyes: Pupils are equal, round, and reactive to light.  Neck: Neck supple.  Cardiovascular: Normal rate.   Pulmonary/Chest:  Tachypnea.  Abdominal: Soft.   and possible mild tenderness.  Musculoskeletal: She exhibits edema.  Some mottling of bilateral lower legs.  Neurological:  Patient is somewhat encephalopathic. Will answer questions but not necessarily appropriately.  Skin: Skin is warm.    ED Course  Procedures (including critical care time) Labs Review Labs Reviewed  CBC WITH DIFFERENTIAL/PLATELET - Abnormal; Notable for the following:    WBC 14.8 (*)    RDW 15.7 (*)    Neutro Abs 12.5 (*)    All other components within normal limits  COMPREHENSIVE METABOLIC PANEL - Abnormal; Notable for the following:    Chloride 93 (*)    CO2 35 (*)    Glucose, Bld 172 (*)    BUN 27 (*)    Creatinine, Ser 1.27 (*)    GFR calc non Af Amer 42 (*)    GFR calc Af Amer 49 (*)    All other components within normal limits  I-STAT CG4 LACTIC ACID, ED - Abnormal; Notable for the following:    Lactic Acid, Venous 5.66 (*)    All other components within normal limits  I-STAT CHEM 8, ED -  Abnormal; Notable for the following:    Chloride 94 (*)    BUN 29 (*)    Creatinine, Ser 1.20 (*)    Glucose, Bld 174 (*)    Calcium, Ion 1.03 (*)    All other components within normal limits  CULTURE, BLOOD (ROUTINE X 2)  CULTURE, BLOOD (ROUTINE X 2)  URINE CULTURE  URINALYSIS, ROUTINE W REFLEX MICROSCOPIC (NOT AT [facility])  PROTIME-INR  APTT  FIBRINOGEN  CK  LACTIC ACID, PLASMA    Imaging Review Dg Chest Portable 1 View  11/14/2014   CLINICAL DATA:  Sepsis.  Fever.  EXAM: PORTABLE CHEST 1 VIEW  COMPARISON:  Chest radiograph 10/22/2014  FINDINGS: Monitoring leads overlie the patient. Stable enlarged cardiac and mediastinal contours. Pulmonary vascular redistribution and mild interstitial opacities bilaterally. No pleural effusion or pneumothorax.  IMPRESSION: Cardiomegaly. Pulmonary vascular redistribution and mild interstitial pulmonary edema.   Electronically Signed   By: Lovey Newcomer M.D.   On: 11/14/2014 22:37   I have personally reviewed and evaluated these images and lab results as part of my medical decision-making.   EKG Interpretation   Date/Time:  Friday November 14 2014 22:06:53 EDT Ventricular Rate:  100 PR Interval:  149 QRS Duration: 143 QT Interval:  408 QTC Calculation: 526 R Axis:   -98 Text Interpretation:  Sinus tachycardia Right bundle branch block Probable  inferior infarct, recent Lateral leads are also involved Confirmed by  Alvino Chapel  MD, Ovid Curd [phone]) on 11/14/2014 11:11:04 PM      MDM   Final diagnoses:  Swelling of lower extremity    Patient with fever. Initially moaning and altered. Complaining of pain everywhere.  Some tenderness of the abdomen. Improved mental status somewhat after IV fluids. Now complaining of back pain. There is some redness of her left lower extremity now. Some tenderness on that side compared to the other side but states it is her back hurts her the most. Urinalysis does not show infection so looking for a source could be on  that right leg. Lactic acid is elevated. IV fluids were given.   CRITICAL CARE Performed by: Mackie Pai Total critical care time: 40 minutes Critical care time was exclusive of separately billable procedures and treating other patients. Critical care was necessary to treat or prevent imminent or life-threatening deterioration. Critical care was time spent personally by me on the following activities: development of treatment plan with patient and/or surrogate as well as nursing, discussions with consultants, evaluation of patient's response to treatment, examination of patient, obtaining history from patient or surrogate, ordering and performing treatments and interventions, ordering and review of laboratory studies, ordering and review of radiographic studies, pulse oximetry and re-evaluation of patient's condition.    Davonna Belling, MD 11/15/14 [phone]

## 2014-11-14 NOTE — Progress Notes (Signed)
ANTIBIOTIC CONSULT NOTE - INITIAL  Pharmacy Consult for Vancomycin / Zosyn Indication: Sepsis  Allergies  Allergen Reactions  . Cymbalta [Duloxetine Hcl] Other (See Comments)    Restless leg syndrome  . Statins Other (See Comments)    Statin drugs cause muscle pain / "muscle damage"--was told by MD not to take  . Sulfa Antibiotics Diarrhea  . Levemir [Insulin Detemir] Itching  . Morphine Other (See Comments)    GI upset and headaches  . Zinc Swelling and Rash    Patient Measurements:   Adjusted Body Weight:   Vital Signs: BP: 162/88 mmHg (09/23 2140) Pulse Rate: 98 (09/23 2140) Intake/Output from previous day:   Intake/Output from this shift:    Labs: No results for input(s): WBC, HGB, PLT, LABCREA, CREATININE in the last 72 hours. Estimated Creatinine Clearance: 76.5 mL/min (by C-G formula based on Cr of 0.99). No results for input(s): VANCOTROUGH, VANCOPEAK, VANCORANDOM, GENTTROUGH, GENTPEAK, GENTRANDOM, TOBRATROUGH, TOBRAPEAK, TOBRARND, AMIKACINPEAK, AMIKACINTROU, AMIKACIN in the last 72 hours.   Microbiology: Recent Results (from the past 720 hour(s))  Urine culture     Status: None   Collection Time: 10/20/14  3:20 PM  Result Value Ref Range Status   Specimen Description URINE, RANDOM  Final   Special Requests NONE  Final   Culture >=100,000 COLONIES/mL ESCHERICHIA COLI  Final   Report Status 10/22/2014 FINAL  Final   Organism ID, Bacteria ESCHERICHIA COLI  Final      Susceptibility   Escherichia coli - MIC*    AMPICILLIN 4 SENSITIVE Sensitive     CEFAZOLIN <=4 SENSITIVE Sensitive     CEFTRIAXONE <=1 SENSITIVE Sensitive     CIPROFLOXACIN <=0.25 SENSITIVE Sensitive     GENTAMICIN <=1 SENSITIVE Sensitive     IMIPENEM <=0.25 SENSITIVE Sensitive     NITROFURANTOIN <=16 SENSITIVE Sensitive     TRIMETH/SULFA <=20 SENSITIVE Sensitive     AMPICILLIN/SULBACTAM 4 SENSITIVE Sensitive     PIP/TAZO <=4 SENSITIVE Sensitive     * >=100,000 COLONIES/mL ESCHERICHIA COLI   Blood Culture (routine x 2)     Status: None   Collection Time: 10/22/14  8:32 PM  Result Value Ref Range Status   Specimen Description BLOOD RIGHT HAND  Final   Special Requests BOTTLES DRAWN AEROBIC AND ANAEROBIC 5CC  Final   Culture NO GROWTH 5 DAYS  Final   Report Status 10/28/2014 FINAL  Final  Blood Culture (routine x 2)     Status: None   Collection Time: 10/22/14 11:25 PM  Result Value Ref Range Status   Specimen Description BLOOD LEFT HAND  Final   Special Requests BOTTLES DRAWN AEROBIC ONLY 2CC  Final   Culture NO GROWTH 5 DAYS  Final   Report Status 10/28/2014 FINAL  Final  MRSA PCR Screening     Status: None   Collection Time: 10/23/14 10:48 AM  Result Value Ref Range Status   MRSA by PCR NEGATIVE NEGATIVE Final    Comment:        The GeneXpert MRSA Assay (FDA approved for NASAL specimens only), is one component of a comprehensive MRSA colonization surveillance program. It is not intended to diagnose MRSA infection nor to guide or monitor treatment for MRSA infections.     Medical History: Past Medical History  Diagnosis Date  . Type II or unspecified type diabetes mellitus without mention of complication, not stated as uncontrolled     insulin dep  . Hyperlipidemia     hx rhabdo on statins  .  Hypertension   . Diastolic CHF   . Chronic diarrhea     a/w nausea - felt related to IBS  . RLS (restless legs syndrome)   . Osteoarthritis   . Stasis dermatitis   . GERD (gastroesophageal reflux disease)   . On home oxygen therapy     uses oxygen 2 liters min per [place] at night and prn during day  . Anemia   . Neuropathy     feet, toes and fingers  . Disc degeneration, lumbar   . OSA (obstructive sleep apnea)     05/2009 sleep study - refuses CPAP  . Deaf     left side only  . Shortness of breath     chronic    Assessment: 89 yoF presents with fever, flank pain, and dysuria. PMHx significant for DM, diastolic CHF, morbid obesity, HTN, HLD, and recent  hospitalization for sepsis secondary to E. Coli UTI, treated with Vanc and Zosyn then narrowed to CTX, however pt reported burning sensation with CTX infusion so abx changed to cipro, then discharged on cephalexin.  In ED, pt noted to have elevated lactic acid and AKI.  Pharmacy consulted to start Vancomycin and Zosyn for sepsis.   Anti-infectives 9/23 >> Vancomycin  >> 9/23 >> Zosyn >>    Vitals/Labs WBC: pending  Tm24h:Pt reports fever at home, afebrile currently Renal: AKI - SCr elevated 1.20, CrCl ~63 CG (N50)  Cultures 9/23 bloodx2: IP 9/23 urine: ordered   Goal of Therapy:  Vancomycin trough level 15-20 mcg/ml  Eradication of infection  Plan:  Zosyn 3.375g IV x 1 over 30 minutes, then start extended infusion 3.375g q8h Vancomycin 2g IV x1, then 1500mg  IV q24h F/u renal function, cultures, clinical course, vanc trough at Css as warranted  Ralene Bathe, PharmD, BCPS 11/14/2014, 10:17 PM  Pager: [phone]

## 2014-11-14 NOTE — ED Notes (Signed)
Pt recently hospitalized for sepsis,  Pt presents today with dysuria, fever,  Flank pain

## 2014-11-14 NOTE — ED Notes (Signed)
Dr Alvino Chapel is aware of vital signs and increased temperature,

## 2014-11-14 NOTE — ED Notes (Signed)
Bed: FY92 Expected date:  Expected time:  Means of arrival:  Comments: EMS 74F dysuria, fever

## 2014-11-14 NOTE — H&P (Signed)
PULMONARY / CRITICAL CARE MEDICINE   Name: Melissa Myers MRN: [id number] DOB: 05-22-1945    ADMISSION DATE:  11/14/2014 CONSULTATION DATE:  11/14/14   REFERRING MD :  Dr. Alvino Chapel   CHIEF COMPLAINT:  Severe sepsis - unknown source  INITIAL PRESENTATION: Brought by EMS with fever and AMS  STUDIES:  CXR  SIGNIFICANT EVENTS:    HISTORY OF PRESENT ILLNESS:  Melissa Myers is a 69 y/o woman with past medical history significant for diastolic CHF, DM2, OHS/OSA on home O2 who was recently admitted with sepsis secondary to a UTI (E. Coli - pan sensitive).  She was treated with antibiotics and discharged home.  Over the last 2 days she has been feeling poorly.  The evening of presentation she developed a fever and altered mental status and her husband called EMS. In the ER she was found to have a fever to 105F and a lactate of 5.66 with an anion gap of 14. She was started on broad spectrum antibiotics and PCCM was called for admission due to her elevated lactate.    A foley had been placed prior to my arrival and was draining clear light colored urine.  UA was was remarkable for spec grav of 1.007 and no WBCs, negative for nitrites or leuk esterase. On my exam her left leg is red, swollen and significantly warmer than her right leg.  There are peau d'orange changes on the posterior thigh above her knee.  There are excoriations on her shin and upper thigh.  She has venous stasis changes on both lower extremities.  I discussed imaging choice with the radiologist on call and they recommended a CT with contrast to evaluate for deep tissue abscess as well as for gas in the fascial planes.    PAST MEDICAL HISTORY :   has a past medical history of Type II or unspecified type diabetes mellitus without mention of complication, not stated as uncontrolled; Hyperlipidemia; Hypertension; Diastolic CHF; Chronic diarrhea; RLS (restless legs syndrome); Osteoarthritis; Stasis dermatitis; GERD (gastroesophageal  reflux disease); On home oxygen therapy; Anemia; Neuropathy; Disc degeneration, lumbar; OSA (obstructive sleep apnea); Deaf; and Shortness of breath.  has past surgical history that includes Tubal ligation (1980); Cholecystectomy (1997); uterine polyp removal (2008); Umbilical hernia repair (1995); Tonsillectomy (1970); and Colonoscopy (N/A, 12/03/2012). Prior to Admission medications   Medication Sig Start Date End Date Taking? Authorizing Sarajean Dessert  aspirin EC 81 MG tablet Take 1 tablet (81 mg total) by mouth daily. 07/08/14   Rowe Clack, MD  B Complex-C (B-COMPLEX WITH VITAMIN C) tablet Take 1 tablet by mouth daily.    Historical Aleea Hendry, MD  Blood Glucose Monitoring Suppl (ONETOUCH VERIO FLEX SYSTEM) W/DEVICE KIT 1 kit by Does not apply route once. Dx Code E11.9 10/23/14   Renato Shin, MD  carvedilol (COREG) 25 MG tablet TAKE 1 TABLET BY MOUTH TWICE DAILY WITH FOOD 11/06/14   Jolaine Artist, MD  Cholecalciferol (VITAMIN D3) 2000 UNITS capsule Take 2,000 Units by mouth 4 (four) times daily - after meals and at bedtime.     Historical Jisell Majer, MD  diclofenac sodium (VOLTAREN) 1 % GEL Apply 2 g topically daily as needed (knee pain). 10/26/14   Florencia Reasons, MD  ezetimibe (ZETIA) 10 MG tablet Take 1 tablet (10 mg total) by mouth daily. 07/08/14   Rowe Clack, MD  fenofibrate 160 MG tablet TAKE 1 TABLET BY MOUTH DAILY 09/04/14   Rowe Clack, MD  gabapentin (NEURONTIN) 300 MG capsule Take 1  capsule (300 mg total) by mouth 4 (four) times daily. Patient taking differently: Take 600 mg by mouth 4 (four) times daily.  07/08/14   Rowe Clack, MD  Glucosamine HCl 1000 MG TABS Take 1,000 mg by mouth 2 (two) times daily.    Historical Wilmarie Sparlin, MD  glucose blood (ONETOUCH VERIO) test strip Test 3 time a day 10/22/14   Renato Shin, MD  hydrALAZINE (APRESOLINE) 25 MG tablet TAKE 1 TABLET (25 MG TOTAL) BY MOUTH 3 (THREE) TIMES DAILY. 10/28/14   Jolaine Artist, MD  hydrOXYzine  (ATARAX/VISTARIL) 10 MG tablet Take 10 mg by mouth 3 (three) times daily as needed for anxiety.    Historical Amry Cathy, MD  insulin regular (HUMULIN R) 100 units/mL injection Inject 0.7 mLs (70 Units total) into the skin 3 (three) times daily before meals. 11/12/14   Renato Shin, MD  Insulin Syringe-Needle U-100 (B-D INSULIN SYRINGE 1CC/27G) 27G X 1/2" 1 ML MISC Use to inject insulin 4 times daily as advised. 06/02/14   Elayne Snare, MD  ketoconazole (NIZORAL) 2 % cream Apply 1 application topically daily. 11/05/14   Hendricks Limes, MD  lactobacillus (FLORANEX/LACTINEX) PACK Take 1 packet (1 g total) by mouth 3 (three) times daily with meals. 10/26/14   Florencia Reasons, MD  Magnesium 500 MG TABS Take 500 mg by mouth daily.    Historical Aahan Marques, MD  metolazone (ZAROXOLYN) 2.5 MG tablet Take 1 tablet (2.5 mg total) by mouth every other day. 07/24/14   Larey Dresser, MD  omega-3 acid ethyl esters (LOVAZA) 1 G capsule Take 1 g by mouth 2 (two) times daily.     Historical Alisan Dokes, MD  ONE TOUCH LANCETS MISC Use one lancet per blood sugar testing.3 times a day 10/22/14   Renato Shin, MD  oxycodone (OXY-IR) 5 MG capsule Take 5 mg by mouth every 6 (six) hours.    Historical Jarmal Lewelling, MD  potassium chloride SA (K-DUR,KLOR-CON) 20 MEQ tablet Take 40 mEq by mouth 2 (two) times daily.    Historical Kalyb Pemble, MD  rOPINIRole (REQUIP) 2 MG tablet Take 2 mg by mouth 2 (two) times daily.    Historical Freyja Govea, MD  senna-docusate (SENOKOT-S) 8.6-50 MG per tablet Take 2 tablets by mouth 2 (two) times daily. 10/26/14   Florencia Reasons, MD  sertraline (ZOLOFT) 25 MG tablet Take 1 tablet (25 mg total) by mouth daily. 10/26/14   Florencia Reasons, MD  spironolactone (ALDACTONE) 25 MG tablet Take 1 tablet (25 mg total) by mouth daily. 07/08/14   Rowe Clack, MD  torsemide (DEMADEX) 100 MG tablet Take 1 tablet (100 mg total) by mouth 2 (two) times daily. 10/10/14   Larey Dresser, MD  triamcinolone cream (KENALOG) 0.1 % Apply 1 application  topically 2 (two) times daily. Patient taking differently: Apply 1 application topically 2 (two) times daily as needed (for rash).  10/09/14   Golden Circle, FNP   Allergies  Allergen Reactions  . Cymbalta [Duloxetine Hcl] Other (See Comments)    Restless leg syndrome  . Statins Other (See Comments)    Statin drugs cause muscle pain / "muscle damage"--was told by MD not to take  . Sulfa Antibiotics Diarrhea  . Levemir [Insulin Detemir] Itching  . Morphine Other (See Comments)    GI upset and headaches  . Zinc Swelling and Rash    FAMILY HISTORY:  indicated that her mother is alive. She indicated that her father is deceased.  SOCIAL HISTORY:  reports that she has  never smoked. She has never used smokeless tobacco. She reports that she does not drink alcohol or use illicit drugs.  REVIEW OF SYSTEMS:  A review of 14 systems was negative except as stated in the HPI.   SUBJECTIVE:   VITAL SIGNS: Temp:  [104.2 F (40.1 C)-105 F (40.6 C)] 105 F (40.6 C) (09/23 2237) Pulse Rate:  [91-98] 91 (09/23 2323) Resp:  [19-26] 22 (09/23 2323) BP: (114-162)/(48-100) 114/48 mmHg (09/23 2323) SpO2:  [91 %-95 %] 94 % (09/23 2240) FiO2 (%):  [3 %] 3 % (09/23 2240) HEMODYNAMICS:   VENTILATOR SETTINGS: Vent Mode:  [-]  FiO2 (%):  [3 %] 3 % INTAKE / OUTPUT:  Intake/Output Summary (Last 24 hours) at 11/14/14 2351 Last data filed at 11/14/14 2312  Gross per 24 hour  Intake   1750 ml  Output      0 ml  Net   1750 ml    PHYSICAL EXAMINATION: Physical Exam  Constitutional: She is oriented to person, place, and time. She appears distressed.  Obese female, appears stated age.  HENT:  Head: Normocephalic and atraumatic.  Nose: Nose normal.  Mouth/Throat: Oropharynx is clear and moist.  Nasal canula in place.  Eyes: Conjunctivae and EOM are normal. Pupils are equal, round, and reactive to light.  Neck: Normal range of motion. Neck supple. No JVD present.  Cardiovascular: Normal rate  and regular rhythm.   Murmur heard. III/VI systolic murmur heard throughout the precordium.  Pulmonary/Chest: Effort normal and breath sounds normal. No respiratory distress. She has no wheezes. She has no rales.  Abdominal: Soft. Bowel sounds are normal. She exhibits no distension and no mass. There is no rebound and no guarding.  Musculoskeletal: She exhibits edema and tenderness.  Left leg erythematous with swelling greater than on the right.  Significantly warmer than the right leg.  Peau d'orange changes on medial and posterior thigh above knee.  Significantly tender to even light touch. Pedal pulses palpable bilaterally.   Lymphadenopathy:    She has no cervical adenopathy.  Neurological: She is oriented to person, place, and time.  Skin: Skin is warm. Rash noted. There is erythema.  Excoriated lesions noted on anterior shin and thigh.  Erythema of left leg.  Venous stasis changes to mid-shin bilaterally.    LABS:  CBC  Recent Labs Lab 11/14/14 2203 11/14/14 2207  WBC 14.8*  --   HGB 12.4 14.3  HCT 39.8 42.0  PLT 312  --    Coag's No results for input(s): APTT, INR in the last 168 hours. BMET  Recent Labs Lab 11/14/14 2203 11/14/14 2207  NA 142 139  K 4.0 3.9  CL 93* 94*  CO2 35*  --   BUN 27* 29*  CREATININE 1.27* 1.20*  GLUCOSE 172* 174*   Electrolytes  Recent Labs Lab 11/14/14 2203  CALCIUM 10.0   Sepsis Markers  Recent Labs Lab 11/14/14 2207  LATICACIDVEN 5.66*   ABG No results for input(s): PHART, PCO2ART, PO2ART in the last 168 hours. Liver Enzymes  Recent Labs Lab 11/14/14 2203  AST 41  ALT 24  ALKPHOS 67  BILITOT 0.5  ALBUMIN 3.9   Cardiac Enzymes No results for input(s): TROPONINI, PROBNP in the last 168 hours. Glucose No results for input(s): GLUCAP in the last 168 hours.  Imaging Dg Chest Portable 1 View  11/14/2014   CLINICAL DATA:  Sepsis.  Fever.  EXAM: PORTABLE CHEST 1 VIEW  COMPARISON:  Chest radiograph 10/22/2014   FINDINGS: Monitoring  leads overlie the patient. Stable enlarged cardiac and mediastinal contours. Pulmonary vascular redistribution and mild interstitial opacities bilaterally. No pleural effusion or pneumothorax.  IMPRESSION: Cardiomegaly. Pulmonary vascular redistribution and mild interstitial pulmonary edema.   Electronically Signed   By: Lovey Newcomer M.D.   On: 11/14/2014 22:37     ASSESSMENT / PLAN:  PULMONARY A:OSA/OHS P:   Continue home O2 - does not tolerate CPAP  CARDIOVASCULAR  A: Hypertensive on presentation P:  Lactate 5.66 on presentation.   Given 1500cc NS in ED - repeat lactate pending.   RENAL A:  AKI P:   Cr on admission 1.27 - baseline appears to be about 1 Will continue fluids overnight as she is getting vancomycin and received contrast Hold diuretics for now - does have diastolic heart failure and has been fluid overloaded in the past.    HEMATOLOGIC A:  At risk for VTE P:  Possible that LE changes are 2/2 to DVT and not source of fever/elevated lactate Will obtain LE dopplers if other studies negative  INFECTIOUS A:  Cellulitis vs. Deeper soft tissue infection - concern for necrotizing fasciitis P:   BCx2 obtained 9/23 UC UA negative Abx: Vancomycin, Zosyn, Clindamycin and adinofungin, start date 9/23, day 0/- CT LE pending Ortho consulted to eval LE for possible nec fasc  ENDOCRINE A:  DM2   P:   SSI   FAMILY  - Updates:   - Inter-disciplinary family meet or Palliative Care meeting due by:  day 7    TODAY'S SUMMARY:       Pulmonary and [place] Pager: [phone]  11/14/2014, 11:51 PM

## 2014-11-14 NOTE — ED Notes (Signed)
Pt arrived around 2135 from her home via EMS,  Pt came in for fever,  Dysuria,  Flank pain,  Pt has continued to yell when anyone touches her,  This Probation officer and multiple other staff have been at her bed entire time until this note,  Pt has temp of 105.0 core,  Hypertension,  Confused and has urinated continuously in bed until indwelling foley catheter placed.  Pt is in view of this writer,  Due to high temp seizures pads placed on bed rails

## 2014-11-14 NOTE — ED Notes (Signed)
Nurse starting IV 

## 2014-11-15 ENCOUNTER — Inpatient Hospital Stay (HOSPITAL_COMMUNITY): Payer: Medicare Other

## 2014-11-15 ENCOUNTER — Encounter: Payer: Self-pay | Admitting: Endocrinology

## 2014-11-15 DIAGNOSIS — T8384XA Pain from genitourinary prosthetic devices, implants and grafts, initial encounter: Secondary | ICD-10-CM | POA: Diagnosis not present

## 2014-11-15 DIAGNOSIS — E119 Type 2 diabetes mellitus without complications: Secondary | ICD-10-CM | POA: Diagnosis present

## 2014-11-15 DIAGNOSIS — A419 Sepsis, unspecified organism: Secondary | ICD-10-CM

## 2014-11-15 DIAGNOSIS — Z79899 Other long term (current) drug therapy: Secondary | ICD-10-CM | POA: Diagnosis not present

## 2014-11-15 DIAGNOSIS — I959 Hypotension, unspecified: Secondary | ICD-10-CM | POA: Diagnosis present

## 2014-11-15 DIAGNOSIS — L03116 Cellulitis of left lower limb: Secondary | ICD-10-CM | POA: Diagnosis not present

## 2014-11-15 DIAGNOSIS — R6520 Severe sepsis without septic shock: Secondary | ICD-10-CM | POA: Diagnosis not present

## 2014-11-15 DIAGNOSIS — E669 Obesity, unspecified: Secondary | ICD-10-CM | POA: Diagnosis present

## 2014-11-15 DIAGNOSIS — R509 Fever, unspecified: Secondary | ICD-10-CM | POA: Diagnosis not present

## 2014-11-15 DIAGNOSIS — Z794 Long term (current) use of insulin: Secondary | ICD-10-CM | POA: Diagnosis not present

## 2014-11-15 DIAGNOSIS — Z9981 Dependence on supplemental oxygen: Secondary | ICD-10-CM | POA: Diagnosis not present

## 2014-11-15 DIAGNOSIS — Z800 Family history of malignant neoplasm of digestive organs: Secondary | ICD-10-CM | POA: Diagnosis not present

## 2014-11-15 DIAGNOSIS — E662 Morbid (severe) obesity with alveolar hypoventilation: Secondary | ICD-10-CM | POA: Diagnosis present

## 2014-11-15 DIAGNOSIS — R0602 Shortness of breath: Secondary | ICD-10-CM | POA: Diagnosis not present

## 2014-11-15 DIAGNOSIS — R6521 Severe sepsis with septic shock: Secondary | ICD-10-CM | POA: Diagnosis present

## 2014-11-15 DIAGNOSIS — Z888 Allergy status to other drugs, medicaments and biological substances status: Secondary | ICD-10-CM | POA: Diagnosis not present

## 2014-11-15 DIAGNOSIS — Z79891 Long term (current) use of opiate analgesic: Secondary | ICD-10-CM | POA: Diagnosis not present

## 2014-11-15 DIAGNOSIS — I10 Essential (primary) hypertension: Secondary | ICD-10-CM | POA: Diagnosis present

## 2014-11-15 DIAGNOSIS — Z8249 Family history of ischemic heart disease and other diseases of the circulatory system: Secondary | ICD-10-CM | POA: Diagnosis not present

## 2014-11-15 DIAGNOSIS — E1165 Type 2 diabetes mellitus with hyperglycemia: Secondary | ICD-10-CM | POA: Diagnosis present

## 2014-11-15 DIAGNOSIS — Y846 Urinary catheterization as the cause of abnormal reaction of the patient, or of later complication, without mention of misadventure at the time of the procedure: Secondary | ICD-10-CM | POA: Diagnosis not present

## 2014-11-15 DIAGNOSIS — Z801 Family history of malignant neoplasm of trachea, bronchus and lung: Secondary | ICD-10-CM | POA: Diagnosis not present

## 2014-11-15 DIAGNOSIS — Z823 Family history of stroke: Secondary | ICD-10-CM | POA: Diagnosis not present

## 2014-11-15 DIAGNOSIS — G92 Toxic encephalopathy: Secondary | ICD-10-CM | POA: Diagnosis present

## 2014-11-15 DIAGNOSIS — E872 Acidosis: Secondary | ICD-10-CM | POA: Diagnosis present

## 2014-11-15 DIAGNOSIS — N390 Urinary tract infection, site not specified: Secondary | ICD-10-CM | POA: Diagnosis present

## 2014-11-15 DIAGNOSIS — I5032 Chronic diastolic (congestive) heart failure: Secondary | ICD-10-CM | POA: Diagnosis not present

## 2014-11-15 DIAGNOSIS — Z885 Allergy status to narcotic agent status: Secondary | ICD-10-CM | POA: Diagnosis not present

## 2014-11-15 DIAGNOSIS — E876 Hypokalemia: Secondary | ICD-10-CM | POA: Diagnosis present

## 2014-11-15 DIAGNOSIS — J9690 Respiratory failure, unspecified, unspecified whether with hypoxia or hypercapnia: Secondary | ICD-10-CM | POA: Diagnosis not present

## 2014-11-15 DIAGNOSIS — A409 Streptococcal sepsis, unspecified: Secondary | ICD-10-CM | POA: Diagnosis not present

## 2014-11-15 DIAGNOSIS — H9192 Unspecified hearing loss, left ear: Secondary | ICD-10-CM | POA: Diagnosis present

## 2014-11-15 DIAGNOSIS — G2581 Restless legs syndrome: Secondary | ICD-10-CM | POA: Diagnosis present

## 2014-11-15 DIAGNOSIS — K219 Gastro-esophageal reflux disease without esophagitis: Secondary | ICD-10-CM | POA: Diagnosis present

## 2014-11-15 DIAGNOSIS — N179 Acute kidney failure, unspecified: Secondary | ICD-10-CM | POA: Diagnosis not present

## 2014-11-15 DIAGNOSIS — Z7982 Long term (current) use of aspirin: Secondary | ICD-10-CM | POA: Diagnosis not present

## 2014-11-15 DIAGNOSIS — E1142 Type 2 diabetes mellitus with diabetic polyneuropathy: Secondary | ICD-10-CM | POA: Diagnosis present

## 2014-11-15 DIAGNOSIS — R600 Localized edema: Secondary | ICD-10-CM | POA: Diagnosis not present

## 2014-11-15 DIAGNOSIS — Z882 Allergy status to sulfonamides status: Secondary | ICD-10-CM | POA: Diagnosis not present

## 2014-11-15 DIAGNOSIS — K589 Irritable bowel syndrome without diarrhea: Secondary | ICD-10-CM | POA: Diagnosis present

## 2014-11-15 DIAGNOSIS — M1990 Unspecified osteoarthritis, unspecified site: Secondary | ICD-10-CM | POA: Diagnosis present

## 2014-11-15 DIAGNOSIS — I878 Other specified disorders of veins: Secondary | ICD-10-CM | POA: Diagnosis present

## 2014-11-15 DIAGNOSIS — E785 Hyperlipidemia, unspecified: Secondary | ICD-10-CM | POA: Diagnosis present

## 2014-11-15 DIAGNOSIS — B9620 Unspecified Escherichia coli [E. coli] as the cause of diseases classified elsewhere: Secondary | ICD-10-CM | POA: Diagnosis present

## 2014-11-15 DIAGNOSIS — I872 Venous insufficiency (chronic) (peripheral): Secondary | ICD-10-CM | POA: Diagnosis present

## 2014-11-15 DIAGNOSIS — M7989 Other specified soft tissue disorders: Secondary | ICD-10-CM | POA: Diagnosis not present

## 2014-11-15 DIAGNOSIS — Z6841 Body Mass Index (BMI) 40.0 and over, adult: Secondary | ICD-10-CM | POA: Diagnosis not present

## 2014-11-15 LAB — CBC
HCT: 35.5 % — ABNORMAL LOW (ref 36.0–46.0)
Hemoglobin: 10.9 g/dL — ABNORMAL LOW (ref 12.0–15.0)
MCH: 26.6 pg (ref 26.0–34.0)
MCHC: 30.7 g/dL (ref 30.0–36.0)
MCV: 86.6 fL (ref 78.0–100.0)
PLATELETS: 311 10*3/uL (ref 150–400)
RBC: 4.1 MIL/uL (ref 3.87–5.11)
RDW: 16.2 % — AB (ref 11.5–15.5)
WBC: 24.9 10*3/uL — ABNORMAL HIGH (ref 4.0–10.5)

## 2014-11-15 LAB — BASIC METABOLIC PANEL
Anion gap: 10 (ref 5–15)
BUN: 29 mg/dL — AB (ref 6–20)
CO2: 37 mmol/L — ABNORMAL HIGH (ref 22–32)
CREATININE: 1.38 mg/dL — AB (ref 0.44–1.00)
Calcium: 8.6 mg/dL — ABNORMAL LOW (ref 8.9–10.3)
Chloride: 96 mmol/L — ABNORMAL LOW (ref 101–111)
GFR calc Af Amer: 44 mL/min — ABNORMAL LOW (ref >60)
GFR, EST NON AFRICAN AMERICAN: 38 mL/min — AB (ref >60)
GLUCOSE: 173 mg/dL — AB (ref 65–99)
POTASSIUM: 3.1 mmol/L — AB (ref 3.5–5.1)
SODIUM: 143 mmol/L (ref 135–145)

## 2014-11-15 LAB — GLUCOSE, CAPILLARY
GLUCOSE-CAPILLARY: 171 mg/dL — AB (ref 65–99)
GLUCOSE-CAPILLARY: 233 mg/dL — AB (ref 65–99)
Glucose-Capillary: 160 mg/dL — ABNORMAL HIGH (ref 65–99)
Glucose-Capillary: 260 mg/dL — ABNORMAL HIGH (ref 65–99)
Glucose-Capillary: 205 mg/dL — ABNORMAL HIGH (ref 65–99)

## 2014-11-15 LAB — FIBRINOGEN: Fibrinogen: 590 mg/dL — ABNORMAL HIGH (ref 204–475)

## 2014-11-15 LAB — APTT: APTT: 28 s (ref 24–37)

## 2014-11-15 LAB — CK: Total CK: 110 U/L (ref 38–234)

## 2014-11-15 LAB — MRSA PCR SCREENING: MRSA by PCR: NEGATIVE

## 2014-11-15 LAB — LACTIC ACID, PLASMA
LACTIC ACID, VENOUS: 3.3 mmol/L — AB (ref 0.5–2.0)
Lactic Acid, Venous: 3.3 mmol/L (ref 0.5–2.0)
Lactic Acid, Venous: 2.3 mmol/L (ref 0.5–2.0)
Lactic Acid, Venous: 2.9 mmol/L (ref 0.5–2.0)

## 2014-11-15 LAB — PROTIME-INR
INR: 1.09 (ref 0.00–1.49)
Prothrombin Time: 14.3 seconds (ref 11.6–15.2)

## 2014-11-15 MED ORDER — INSULIN ASPART 100 UNIT/ML ~~LOC~~ SOLN
1.0000 [IU] | SUBCUTANEOUS | Status: DC
Start: 2014-11-15 — End: 2014-11-15

## 2014-11-15 MED ORDER — FENTANYL CITRATE (PF) 100 MCG/2ML IJ SOLN
INTRAMUSCULAR | Status: AC
  Filled 2014-11-15: qty 2

## 2014-11-15 MED ORDER — GABAPENTIN 300 MG PO CAPS
600.0000 mg | ORAL_CAPSULE | Freq: Four times a day (QID) | ORAL | Status: DC
  Administered 2014-11-15 – 2014-11-17 (×8): 600 mg via ORAL
  Filled 2014-11-15 (×8): qty 2

## 2014-11-15 MED ORDER — ROPINIROLE HCL 1 MG PO TABS
2.0000 mg | ORAL_TABLET | Freq: Two times a day (BID) | ORAL | Status: DC
  Administered 2014-11-15 – 2014-11-20 (×10): 2 mg via ORAL
  Filled 2014-11-15 (×13): qty 2

## 2014-11-15 MED ORDER — FENTANYL CITRATE (PF) 100 MCG/2ML IJ SOLN
25.0000 ug | INTRAMUSCULAR | Status: DC | PRN
  Administered 2014-11-15 – 2014-11-19 (×6): 25 ug via INTRAVENOUS
  Filled 2014-11-15 (×6): qty 2

## 2014-11-15 MED ORDER — CETYLPYRIDINIUM CHLORIDE 0.05 % MT LIQD
7.0000 mL | Freq: Two times a day (BID) | OROMUCOSAL | Status: DC
  Administered 2014-11-15 – 2014-11-16 (×3): 7 mL via OROMUCOSAL

## 2014-11-15 MED ORDER — CHLORHEXIDINE GLUCONATE 0.12 % MT SOLN
15.0000 mL | Freq: Two times a day (BID) | OROMUCOSAL | Status: DC
  Administered 2014-11-15 – 2014-11-16 (×3): 15 mL via OROMUCOSAL
  Filled 2014-11-15 (×3): qty 15

## 2014-11-15 MED ORDER — ASPIRIN 300 MG RE SUPP
300.0000 mg | RECTAL | Status: AC
  Filled 2014-11-15: qty 1

## 2014-11-15 MED ORDER — SERTRALINE HCL 50 MG PO TABS
25.0000 mg | ORAL_TABLET | Freq: Every day | ORAL | Status: DC
  Administered 2014-11-15 – 2014-11-20 (×6): 25 mg via ORAL
  Filled 2014-11-15 (×6): qty 1

## 2014-11-15 MED ORDER — PHENYLEPHRINE HCL 10 MG/ML IJ SOLN
30.0000 ug/min | INTRAVENOUS | Status: DC
  Filled 2014-11-15: qty 1

## 2014-11-15 MED ORDER — HEPARIN SODIUM (PORCINE) 5000 UNIT/ML IJ SOLN
5000.0000 [IU] | Freq: Three times a day (TID) | INTRAMUSCULAR | Status: DC
  Administered 2014-11-15 – 2014-11-20 (×16): 5000 [IU] via SUBCUTANEOUS
  Filled 2014-11-15 (×20): qty 1

## 2014-11-15 MED ORDER — ACETAMINOPHEN 325 MG PO TABS
650.0000 mg | ORAL_TABLET | ORAL | Status: DC | PRN
  Administered 2014-11-15 – 2014-11-16 (×4): 650 mg via ORAL
  Filled 2014-11-15 (×4): qty 2

## 2014-11-15 MED ORDER — FENTANYL CITRATE (PF) 100 MCG/2ML IJ SOLN
25.0000 ug | Freq: Once | INTRAMUSCULAR | Status: AC
  Administered 2014-11-15: 25 ug via INTRAVENOUS

## 2014-11-15 MED ORDER — POTASSIUM CHLORIDE 10 MEQ/100ML IV SOLN
10.0000 meq | INTRAVENOUS | Status: AC
  Administered 2014-11-15 (×3): 10 meq via INTRAVENOUS
  Filled 2014-11-15 (×3): qty 100

## 2014-11-15 MED ORDER — INSULIN ASPART 100 UNIT/ML ~~LOC~~ SOLN
0.0000 [IU] | SUBCUTANEOUS | Status: DC
  Administered 2014-11-15 (×2): 4 [IU] via SUBCUTANEOUS
  Administered 2014-11-15: 7 [IU] via SUBCUTANEOUS
  Administered 2014-11-15 – 2014-11-16 (×2): 11 [IU] via SUBCUTANEOUS
  Administered 2014-11-16: 7 [IU] via SUBCUTANEOUS
  Administered 2014-11-16: 4 [IU] via SUBCUTANEOUS
  Administered 2014-11-16: 7 [IU] via SUBCUTANEOUS
  Administered 2014-11-16: 4 [IU] via SUBCUTANEOUS
  Administered 2014-11-16 – 2014-11-17 (×2): 7 [IU] via SUBCUTANEOUS
  Administered 2014-11-17: 15 [IU] via SUBCUTANEOUS
  Administered 2014-11-17: 7 [IU] via SUBCUTANEOUS

## 2014-11-15 MED ORDER — SODIUM CHLORIDE 0.9 % IV SOLN
250.0000 mL | INTRAVENOUS | Status: DC | PRN
  Administered 2014-11-15 (×2): 250 mL via INTRAVENOUS

## 2014-11-15 MED ORDER — SODIUM CHLORIDE 0.9 % IV SOLN
INTRAVENOUS | Status: DC
  Administered 2014-11-15: 100 mL via INTRAVENOUS
  Administered 2014-11-15: 1000 mL via INTRAVENOUS
  Administered 2014-11-15: 05:00:00 via INTRAVENOUS
  Administered 2014-11-16: 1000 mL via INTRAVENOUS

## 2014-11-15 MED ORDER — ASPIRIN 81 MG PO CHEW
324.0000 mg | CHEWABLE_TABLET | ORAL | Status: AC
  Administered 2014-11-15: 324 mg via ORAL
  Filled 2014-11-15: qty 4

## 2014-11-15 NOTE — ED Notes (Signed)
2nd atttempt to give report,  No receiving nurse

## 2014-11-15 NOTE — Progress Notes (Signed)
Patient ID: Melissa Myers, female   DOB: 04-24-1945, 69 y.o.   MRN: [id number] I re-examined her left leg this am to compare it to my exam at midnight.  Still warm with appearance of cellulitis.  Still no fluctuant area or open wound.  No crepitation in the soft-tissue.  No acute indication thus far for a surgical intervention.  Will follow.

## 2014-11-15 NOTE — ED Notes (Signed)
Joy at bedside attempting to draw 5 am labs

## 2014-11-15 NOTE — ED Notes (Signed)
Pt has been tender to touch entire body since arrival as stated previous she yells every time she is touched,

## 2014-11-15 NOTE — ED Notes (Signed)
Critical lactic acid = 3.3.  MD made aware.

## 2014-11-15 NOTE — Progress Notes (Signed)
[facility] Progress Note Patient Name: Melissa Myers DOB: [birth year] MRN: [id number]   Date of Service  11/15/2014  HPI/Events of Note  Pt with hx of RLS.  eICU Interventions  Re-order home requip.     Intervention Category Major Interventions: Other:  Sricharan Lacomb 11/15/2014, 3:38 PM

## 2014-11-15 NOTE — ED Notes (Signed)
Report attempted,  Nurse to call back when assigned to pt

## 2014-11-15 NOTE — Progress Notes (Signed)
PULMONARY / CRITICAL CARE MEDICINE   Name: Melissa Myers MRN: [id number] DOB: 1945-08-23    ADMISSION DATE:  11/14/2014 CONSULTATION DATE:  11/14/14  REFERRING MD : Dr. Alvino Chapel  CHIEF COMPLAINT: Severe sepsis - unknown source  INITIAL PRESENTATION: Brought by EMS with fever and AMS  STUDIES:  CXR (11/14/14). Cardiomegaly. Pulmonary vascular redistribution and mild interstitial pulmonary edema.  SIGNIFICANT EVENTS: 9/23- Admitted with severe sepsis, lactic acidosis  HISTORY OF PRESENT ILLNESS: [name] is a 69 y/o woman with past medical history significant for diastolic CHF, DM2, OHS/OSA on home O2 who was recently admitted with sepsis secondary to a UTI (E. Coli - pan sensitive). She was treated with antibiotics and discharged home. Over the last 2 days she has been feeling poorly. The evening of presentation she developed a fever and altered mental status and her husband called EMS. In the ER she was found to have a fever to 105F and a lactate of 5.66 with an anion gap of 14. She was started on broad spectrum antibiotics and PCCM was called for admission due to her elevated lactate.   PAST MEDICAL HISTORY :   has a past medical history of Type II or unspecified type diabetes mellitus without mention of complication, not stated as uncontrolled; Hyperlipidemia; Hypertension; Diastolic CHF; Chronic diarrhea; RLS (restless legs syndrome); Osteoarthritis; Stasis dermatitis; GERD (gastroesophageal reflux disease); On home oxygen therapy; Anemia; Neuropathy; Disc degeneration, lumbar; OSA (obstructive sleep apnea); Deaf; and Shortness of breath.  has past surgical history that includes Tubal ligation (1980); Cholecystectomy (1997); uterine polyp removal (2008); Umbilical hernia repair (1995); Tonsillectomy (1970); and Colonoscopy (N/A, 12/03/2012). Prior to Admission medications   Medication Sig Start Date End Date Taking? Authorizing Provider  aspirin EC 81 MG tablet  Take 1 tablet (81 mg total) by mouth daily. 07/08/14   Rowe Clack, MD  B Complex-C (B-COMPLEX WITH VITAMIN C) tablet Take 1 tablet by mouth daily.    Historical Provider, MD  Blood Glucose Monitoring Suppl (ONETOUCH VERIO FLEX SYSTEM) W/DEVICE KIT 1 kit by Does not apply route once. Dx Code E11.9 10/23/14   Renato Shin, MD  carvedilol (COREG) 25 MG tablet TAKE 1 TABLET BY MOUTH TWICE DAILY WITH FOOD 11/06/14   Jolaine Artist, MD  Cholecalciferol (VITAMIN D3) 2000 UNITS capsule Take 2,000 Units by mouth 4 (four) times daily - after meals and at bedtime.     Historical Provider, MD  diclofenac sodium (VOLTAREN) 1 % GEL Apply 2 g topically daily as needed (knee pain). 10/26/14   Florencia Reasons, MD  ezetimibe (ZETIA) 10 MG tablet Take 1 tablet (10 mg total) by mouth daily. 07/08/14   Rowe Clack, MD  fenofibrate 160 MG tablet TAKE 1 TABLET BY MOUTH DAILY 09/04/14   Rowe Clack, MD  gabapentin (NEURONTIN) 300 MG capsule Take 1 capsule (300 mg total) by mouth 4 (four) times daily. Patient taking differently: Take 600 mg by mouth 4 (four) times daily.  07/08/14   Rowe Clack, MD  Glucosamine HCl 1000 MG TABS Take 1,000 mg by mouth 2 (two) times daily.    Historical Provider, MD  glucose blood (ONETOUCH VERIO) test strip Test 3 time a day 10/22/14   Renato Shin, MD  hydrALAZINE (APRESOLINE) 25 MG tablet TAKE 1 TABLET (25 MG TOTAL) BY MOUTH 3 (THREE) TIMES DAILY. 10/28/14   Jolaine Artist, MD  hydrOXYzine (ATARAX/VISTARIL) 10 MG tablet Take 10 mg by mouth 3 (three) times daily as needed for anxiety.  Historical Provider, MD  insulin regular (HUMULIN R) 100 units/mL injection Inject 0.7 mLs (70 Units total) into the skin 3 (three) times daily before meals. 11/12/14   Renato Shin, MD  Insulin Syringe-Needle U-100 (B-D INSULIN SYRINGE 1CC/27G) 27G X 1/2" 1 ML MISC Use to inject insulin 4 times daily as advised. 06/02/14   Elayne Snare, MD  ketoconazole (NIZORAL) 2 % cream Apply 1 application  topically daily. 11/05/14   Hendricks Limes, MD  lactobacillus (FLORANEX/LACTINEX) PACK Take 1 packet (1 g total) by mouth 3 (three) times daily with meals. 10/26/14   Florencia Reasons, MD  Magnesium 500 MG TABS Take 500 mg by mouth daily.    Historical Provider, MD  metolazone (ZAROXOLYN) 2.5 MG tablet Take 1 tablet (2.5 mg total) by mouth every other day. 07/24/14   Larey Dresser, MD  omega-3 acid ethyl esters (LOVAZA) 1 G capsule Take 1 g by mouth 2 (two) times daily.     Historical Provider, MD  ONE TOUCH LANCETS MISC Use one lancet per blood sugar testing.3 times a day 10/22/14   Renato Shin, MD  oxycodone (OXY-IR) 5 MG capsule Take 5 mg by mouth every 6 (six) hours.    Historical Provider, MD  potassium chloride SA (K-DUR,KLOR-CON) 20 MEQ tablet Take 40 mEq by mouth 2 (two) times daily.    Historical Provider, MD  rOPINIRole (REQUIP) 2 MG tablet Take 2 mg by mouth 2 (two) times daily.    Historical Provider, MD  senna-docusate (SENOKOT-S) 8.6-50 MG per tablet Take 2 tablets by mouth 2 (two) times daily. 10/26/14   Florencia Reasons, MD  sertraline (ZOLOFT) 25 MG tablet Take 1 tablet (25 mg total) by mouth daily. 10/26/14   Florencia Reasons, MD  spironolactone (ALDACTONE) 25 MG tablet Take 1 tablet (25 mg total) by mouth daily. 07/08/14   Rowe Clack, MD  torsemide (DEMADEX) 100 MG tablet Take 1 tablet (100 mg total) by mouth 2 (two) times daily. 10/10/14   Larey Dresser, MD  triamcinolone cream (KENALOG) 0.1 % Apply 1 application topically 2 (two) times daily. Patient taking differently: Apply 1 application topically 2 (two) times daily as needed (for rash).  10/09/14   Golden Circle, FNP   Allergies  Allergen Reactions  . Cymbalta [Duloxetine Hcl] Other (See Comments)    Restless leg syndrome  . Statins Other (See Comments)    Statin drugs cause muscle pain / "muscle damage"--was told by MD not to take  . Sulfa Antibiotics Diarrhea  . Levemir [Insulin Detemir] Itching  . Morphine Other (See Comments)    GI  upset and headaches  . Zinc Swelling and Rash    FAMILY HISTORY:  indicated that her mother is alive. She indicated that her father is deceased.  SOCIAL HISTORY:  reports that she has never smoked. She has never used smokeless tobacco. She reports that she does not drink alcohol or use illicit drugs.  REVIEW OF SYSTEMS:    SUBJECTIVE:  Complaints of body pain and pain from foley's cath  VITAL SIGNS: Temp:  [100.1 F (37.8 C)-105 F (40.6 C)] 100.1 F (37.8 C) (09/24 0800) Pulse Rate:  [75-98] 75 (09/24 0700) Resp:  [16-51] 23 (09/24 0700) BP: (98-162)/(32-125) 123/38 mmHg (09/24 0700) SpO2:  [91 %-100 %] 100 % (09/24 0700) FiO2 (%):  [3 %] 3 % (09/23 2240) Weight:  [287 lb 0.6 oz (130.2 kg)] 287 lb 0.6 oz (130.2 kg) (09/24 0500) HEMODYNAMICS:   VENTILATOR SETTINGS: Vent Mode:  [-]  FiO2 (%):  [3 %] 3 % INTAKE / OUTPUT:  Intake/Output Summary (Last 24 hours) at 11/15/14 0838 Last data filed at 11/15/14 0600  Gross per 24 hour  Intake   4159 ml  Output    900 ml  Net   3259 ml    PHYSICAL EXAMINATION: General:  Awake, responsive, Mild distress Neuro:  No focal deficits HEENT:  PERRL, Moist mucus membranes Cardiovascular:  RRR, No MRG Lungs:  Clear antr Abdomen:  Soft, + BS  LABS:  CBC  Recent Labs Lab 11/14/14 2203 11/14/14 2207 11/15/14 0407  WBC 14.8*  --  24.9*  HGB 12.4 14.3 10.9*  HCT 39.8 42.0 35.5*  PLT 312  --  311   Coag's  Recent Labs Lab 11/14/14 2333  APTT 28  INR 1.09   BMET  Recent Labs Lab 11/14/14 2203 11/14/14 2207 11/15/14 0407  NA 142 139 143  K 4.0 3.9 3.1*  CL 93* 94* 96*  CO2 35*  --  37*  BUN 27* 29* 29*  CREATININE 1.27* 1.20* 1.38*  GLUCOSE 172* 174* 173*   Electrolytes  Recent Labs Lab 11/14/14 2203 11/15/14 0407  CALCIUM 10.0 8.6*   Sepsis Markers  Recent Labs Lab 11/14/14 2359 11/15/14 0439 11/15/14 0727  LATICACIDVEN 3.3* 3.3* 2.3*   ABG No results for input(s): PHART, PCO2ART, PO2ART in  the last 168 hours. Liver Enzymes  Recent Labs Lab 11/14/14 2203  AST 41  ALT 24  ALKPHOS 67  BILITOT 0.5  ALBUMIN 3.9   Cardiac Enzymes No results for input(s): TROPONINI, PROBNP in the last 168 hours. Glucose  Recent Labs Lab 11/15/14 0733  GLUCAP 160*    Imaging Dg Chest Portable 1 View  11/14/2014   CLINICAL DATA:  Sepsis.  Fever.  EXAM: PORTABLE CHEST 1 VIEW  COMPARISON:  Chest radiograph 10/22/2014  FINDINGS: Monitoring leads overlie the patient. Stable enlarged cardiac and mediastinal contours. Pulmonary vascular redistribution and mild interstitial opacities bilaterally. No pleural effusion or pneumothorax.  IMPRESSION: Cardiomegaly. Pulmonary vascular redistribution and mild interstitial pulmonary edema.   Electronically Signed   By: Lovey Newcomer M.D.   On: 11/14/2014 22:37   Ct Extrem Lower W Cm Bil  11/15/2014   CLINICAL DATA:  Diffuse left leg redness and swelling, most pronounced in the posterior aspect of the distal thigh. Clinical concern for necrotizing fasciitis.  EXAM: CT OF THE LOWER BILATERAL EXTREMITY WITH CONTRAST  TECHNIQUE: Multidetector CT imaging of the was performed according to the standard protocol following intravenous contrast administration.  COMPARISON:  None.  CONTRAST:  118mL OMNIPAQUE IOHEXOL 300 MG/ML  SOLN  FINDINGS: Mild bilateral subcutaneous edema. This is symmetrical. More pronounced diffuse subcutaneous edema in the ankles and feet. No fluid collections or soft tissue gas. No bone destruction or periosteal reaction. Bilateral knee degenerative changes, greater on the left. Foley catheter in the urinary bladder.  IMPRESSION: Mild bilateral subcutaneous edema without abscess, evidence of osteomyelitis or evidence of necrotizing fasciitis.   Electronically Signed   By: Claudie Revering M.D.   On: 11/15/2014 00:38     ASSESSMENT / PLAN:  PULMONARY A:OSA/OHS P:  Continue home O2 - does not tolerate CPAP  CARDIOVASCULAR  A: Hypertensive on  presentation P:  Lactate 5.66 on presentation. Improved with hydration. Was hypotensive but responded to fluids.  RENAL A: AKI P:  Cr on admission 1.27 - baseline appears to be about 1 Will monitor as she is getting hydrated   HEMATOLOGIC A: At risk for  VTE P:  Possible that LE changes are 2/2 to DVT and not source of fever/elevated lactate Follow LE dopplers  INFECTIOUS A: Cellulitis vs. Deeper soft tissue infection - concern for necrotizing fasciitis P:  BCx2 obtained 9/23 UC UA negative Abx: Vancomycin, Zosyn, Clindamycin and adinofungin, start date 9/23, day 2/- No need for surgery as per Ortho. CT of the Lt LE pending.  ENDOCRINE A: DM2  P:  SSI  FAMILY  - Updates:  - Inter-disciplinary family meet or Palliative Care meeting due by: day 7  TODAY'S SUMMARY: Admitted with sepsis likely from LE cellulitis. Improving hemodynamics and lactic acid.   Marshell Garfinkel MD [facility] Pager [phone] If no answer or after 3pm call: [phone] 11/15/2014, 8:38 AM

## 2014-11-15 NOTE — ED Notes (Signed)
Blood glucose has been stable,  No insulin given ,  Pt also receiving fluid boluses

## 2014-11-15 NOTE — Progress Notes (Signed)
CRITICAL VALUE ALERT  Critical value received:  Lactic acid 3.3  Date of notification:  11/15/2014  Time of notification:  5465  Critical value read back:Yes.    Nurse who received alert:  mk  MD notified (1st page):  sommer  Time of first page:  0600  MD notified (2nd page):  Time of second page:  Responding MD:  sommer (spoke with Rockwell Germany)  Time MD responded:  0600

## 2014-11-15 NOTE — ED Notes (Signed)
Patient transported to CT 

## 2014-11-15 NOTE — ED Notes (Signed)
Pt has began tossing back and forth states trying to get comfortable,  Pt's blood pressure has decreased since laying on her right side,  Pt is not symptomatic but this writer will follow orders to given normal saline 250 cc bolus

## 2014-11-15 NOTE — ED Notes (Signed)
Surgeon at bedside to evaluate left lower extremity

## 2014-11-15 NOTE — Consult Note (Signed)
Reason for Consult:  Concern for necrotizing fasciitis left thigh Referring Physician: Lincoln Maxin, MD - Critical care  Melissa Myers is an 69 y.o. female.  HPI:   69 yo diabetic female with left thigh pain, redness and swelling.  Also with high fever, elevated WBC and elevated lactate levels.  With the concern for possible necrotizing fasciitis, Ortho is consulted.  She reports "pain all over".  Past Medical History  Diagnosis Date  . Type II or unspecified type diabetes mellitus without mention of complication, not stated as uncontrolled     insulin dep  . Hyperlipidemia     hx rhabdo on statins  . Hypertension   . Diastolic CHF   . Chronic diarrhea     a/w nausea - felt related to IBS  . RLS (restless legs syndrome)   . Osteoarthritis   . Stasis dermatitis   . GERD (gastroesophageal reflux disease)   . On home oxygen therapy     uses oxygen 2 liters min per [place] at night and prn during day  . Anemia   . Neuropathy     feet, toes and fingers  . Disc degeneration, lumbar   . OSA (obstructive sleep apnea)     05/2009 sleep study - refuses CPAP  . Deaf     left side only  . Shortness of breath     chronic    Past Surgical History  Procedure Laterality Date  . Tubal ligation  1980  . Cholecystectomy  1997  . Uterine polyp removal  2008  . Umbilical hernia repair  1995  . Tonsillectomy  1970  . Colonoscopy N/A 12/03/2012    Procedure: COLONOSCOPY;  Surgeon: Lafayette Dragon, MD;  Location: [facility];  Service: Endoscopy;  Laterality: N/A;    Family History  Problem Relation Age of Onset  . Heart disease Mother   . Heart disease Father   . Heart disease      family history  . Stomach cancer Paternal Grandmother   . Lung cancer Paternal Grandfather   . Colon cancer Neg Hx   . CVA      several aunts  . Heart attack Mother 11  . Heart attack Father 51  . Heart attack      several aunts and an uncle    Social History:  reports that she has never smoked. She has  never used smokeless tobacco. She reports that she does not drink alcohol or use illicit drugs.  Allergies:  Allergies  Allergen Reactions  . Cymbalta [Duloxetine Hcl] Other (See Comments)    Restless leg syndrome  . Statins Other (See Comments)    Statin drugs cause muscle pain / "muscle damage"--was told by MD not to take  . Sulfa Antibiotics Diarrhea  . Levemir [Insulin Detemir] Itching  . Morphine Other (See Comments)    GI upset and headaches  . Zinc Swelling and Rash    Medications: I have reviewed the patient's current medications.  Results for orders placed or performed during the hospital encounter of 11/14/14 (from the past 48 hour(s))  CBC with Differential     Status: Abnormal   Collection Time: 11/14/14 10:03 PM  Result Value Ref Range   WBC 14.8 (H) 4.0 - 10.5 K/uL   RBC 4.61 3.87 - 5.11 MIL/uL   Hemoglobin 12.4 12.0 - 15.0 g/dL   HCT 39.8 36.0 - 46.0 %   MCV 86.3 78.0 - 100.0 fL   MCH 26.9 26.0 - 34.0 pg  MCHC 31.2 30.0 - 36.0 g/dL   RDW 15.7 (H) 11.5 - 15.5 %   Platelets 312 150 - 400 K/uL   Neutrophils Relative % 84 %   Neutro Abs 12.5 (H) 1.7 - 7.7 K/uL   Lymphocytes Relative 11 %   Lymphs Abs 1.6 0.7 - 4.0 K/uL   Monocytes Relative 4 %   Monocytes Absolute 0.5 0.1 - 1.0 K/uL   Eosinophils Relative 1 %   Eosinophils Absolute 0.1 0.0 - 0.7 K/uL   Basophils Relative 0 %   Basophils Absolute 0.0 0.0 - 0.1 K/uL  Comprehensive metabolic panel     Status: Abnormal   Collection Time: 11/14/14 10:03 PM  Result Value Ref Range   Sodium 142 135 - 145 mmol/L   Potassium 4.0 3.5 - 5.1 mmol/L   Chloride 93 (L) 101 - 111 mmol/L   CO2 35 (H) 22 - 32 mmol/L   Glucose, Bld 172 (H) 65 - 99 mg/dL   BUN 27 (H) 6 - 20 mg/dL   Creatinine, Ser 1.27 (H) 0.44 - 1.00 mg/dL   Calcium 10.0 8.9 - 10.3 mg/dL   Total Protein 8.0 6.5 - 8.1 g/dL   Albumin 3.9 3.5 - 5.0 g/dL   AST 41 15 - 41 U/L   ALT 24 14 - 54 U/L   Alkaline Phosphatase 67 38 - 126 U/L   Total Bilirubin  0.5 0.3 - 1.2 mg/dL   GFR calc non Af Amer 42 (L) >60 mL/min   GFR calc Af Amer 49 (L) >60 mL/min    Comment: (NOTE) The eGFR has been calculated using the CKD EPI equation. This calculation has not been validated in all clinical situations. eGFR's persistently <60 mL/min signify possible Chronic Kidney Disease.    Anion gap 14 5 - 15  I-Stat CG4 Lactic Acid, ED     Status: Abnormal   Collection Time: 11/14/14 10:07 PM  Result Value Ref Range   Lactic Acid, Venous 5.66 (HH) 0.5 - 2.0 mmol/L   Comment NOTIFIED PHYSICIAN   I-stat Chem 8, ED     Status: Abnormal   Collection Time: 11/14/14 10:07 PM  Result Value Ref Range   Sodium 139 135 - 145 mmol/L   Potassium 3.9 3.5 - 5.1 mmol/L   Chloride 94 (L) 101 - 111 mmol/L   BUN 29 (H) 6 - 20 mg/dL   Creatinine, Ser 1.20 (H) 0.44 - 1.00 mg/dL   Glucose, Bld 174 (H) 65 - 99 mg/dL   Calcium, Ion 1.03 (L) 1.13 - 1.30 mmol/L   TCO2 35 0 - 100 mmol/L   Hemoglobin 14.3 12.0 - 15.0 g/dL   HCT 42.0 36.0 - 46.0 %  Urinalysis, Routine w reflex microscopic     Status: None   Collection Time: 11/14/14 10:38 PM  Result Value Ref Range   Color, Urine YELLOW YELLOW   APPearance CLEAR CLEAR   Specific Gravity, Urine 1.007 1.005 - 1.030   pH 6.5 5.0 - 8.0   Glucose, UA NEGATIVE NEGATIVE mg/dL   Hgb urine dipstick NEGATIVE NEGATIVE   Bilirubin Urine NEGATIVE NEGATIVE   Ketones, ur NEGATIVE NEGATIVE mg/dL   Protein, ur NEGATIVE NEGATIVE mg/dL   Urobilinogen, UA 0.2 0.0 - 1.0 mg/dL   Nitrite NEGATIVE NEGATIVE   Leukocytes, UA NEGATIVE NEGATIVE    Comment: MICROSCOPIC NOT DONE ON URINES WITH NEGATIVE PROTEIN, BLOOD, LEUKOCYTES, NITRITE, OR GLUCOSE <1000 mg/dL.  Protime-INR     Status: None   Collection Time: 11/14/14 11:33 PM  Result Value Ref Range   Prothrombin Time 14.3 11.6 - 15.2 seconds   INR 1.09 0.00 - 1.49  APTT     Status: None   Collection Time: 11/14/14 11:33 PM  Result Value Ref Range   aPTT 28 24 - 37 seconds  Fibrinogen      Status: Abnormal   Collection Time: 11/14/14 11:33 PM  Result Value Ref Range   Fibrinogen 590 (H) 204 - 475 mg/dL  CK     Status: None   Collection Time: 11/14/14 11:33 PM  Result Value Ref Range   Total CK 110 38 - 234 U/L    Dg Chest Portable 1 View  11/14/2014   CLINICAL DATA:  Sepsis.  Fever.  EXAM: PORTABLE CHEST 1 VIEW  COMPARISON:  Chest radiograph 10/22/2014  FINDINGS: Monitoring leads overlie the patient. Stable enlarged cardiac and mediastinal contours. Pulmonary vascular redistribution and mild interstitial opacities bilaterally. No pleural effusion or pneumothorax.  IMPRESSION: Cardiomegaly. Pulmonary vascular redistribution and mild interstitial pulmonary edema.   Electronically Signed   By: Lovey Newcomer M.D.   On: 11/14/2014 22:37   Ct Extrem Lower W Cm Bil  11/15/2014   CLINICAL DATA:  Diffuse left leg redness and swelling, most pronounced in the posterior aspect of the distal thigh. Clinical concern for necrotizing fasciitis.  EXAM: CT OF THE LOWER BILATERAL EXTREMITY WITH CONTRAST  TECHNIQUE: Multidetector CT imaging of the was performed according to the standard protocol following intravenous contrast administration.  COMPARISON:  None.  CONTRAST:  182m OMNIPAQUE IOHEXOL 300 MG/ML  SOLN  FINDINGS: Mild bilateral subcutaneous edema. This is symmetrical. More pronounced diffuse subcutaneous edema in the ankles and feet. No fluid collections or soft tissue gas. No bone destruction or periosteal reaction. Bilateral knee degenerative changes, greater on the left. Foley catheter in the urinary bladder.  IMPRESSION: Mild bilateral subcutaneous edema without abscess, evidence of osteomyelitis or evidence of necrotizing fasciitis.   Electronically Signed   By: SClaudie ReveringM.D.   On: 11/15/2014 00:38    ROS Blood pressure 114/48, pulse 91, temperature 105 F (40.6 C), temperature source Core (Comment), resp. rate 22, SpO2 94 %. Physical Exam  Musculoskeletal:       Legs:   Her left  thigh shows no specific area of fluid collection.  The area on her inner thigh is adipose tissue on exam and CT scan and is non-tender.  I can not find a specific area of pain.  There is no crepitation or clinical exam findings to suggest necrotizing fasciitis.   Assessment/Plan: Left thigh and leg cellulitis and bilateral leg venous stasis changes 1)  I reviewed the CT scan of her left thigh and spoke with the Radiologist about it as well.  There is no evidence of a fluid collection, abscess, or air in the soft-tissue to suggest a necrotizing fasciitis.  Also, her clinical exam points to more of a cellulitis.  I do not see the indication for surgical management of this thus far.  Will follow.  BLACKMAN,CHRISTOPHER Y 11/15/2014, 12:45 AM

## 2014-11-15 NOTE — Progress Notes (Signed)
*  PRELIMINARY RESULTS* Vascular Ultrasound Lower extremity venous duplex. has been completed.  Preliminary findings: No evidence of DVT in visualized veins.   Landry Mellow, RDMS, RVT  11/15/2014, 8:47 AM

## 2014-11-15 NOTE — Progress Notes (Signed)
eLink Physician-Brief Progress Note Patient Name: Melissa Myers DOB: 07/27/1945 MRN: [id number]   Date of Service  11/15/2014  HPI/Events of Note  MAP goal of 65 not met. Lactic Acid has cleared to 3.3. No central venous access.   eICU Interventions  Will start Phenylephrine IV infusion. Titrate to MAP >= 65.     Intervention Category Major Interventions: Sepsis - evaluation and management;Shock - evaluation and management;Hypotension - evaluation and management  Sommer,Steven Eugene 11/15/2014, 6:44 AM 

## 2014-11-16 ENCOUNTER — Inpatient Hospital Stay (HOSPITAL_COMMUNITY): Payer: Medicare Other

## 2014-11-16 LAB — C DIFFICILE QUICK SCREEN W PCR REFLEX
C Diff antigen: POSITIVE — AB
C Diff toxin: NEGATIVE

## 2014-11-16 LAB — CBC
HEMATOCRIT: 30.1 % — AB (ref 36.0–46.0)
HEMOGLOBIN: 9.4 g/dL — AB (ref 12.0–15.0)
MCH: 26.8 pg (ref 26.0–34.0)
MCHC: 31.2 g/dL (ref 30.0–36.0)
MCV: 85.8 fL (ref 78.0–100.0)
Platelets: 214 10*3/uL (ref 150–400)
RBC: 3.51 MIL/uL — ABNORMAL LOW (ref 3.87–5.11)
RDW: 16.7 % — AB (ref 11.5–15.5)
WBC: 17.8 10*3/uL — AB (ref 4.0–10.5)

## 2014-11-16 LAB — BASIC METABOLIC PANEL
ANION GAP: 9 (ref 5–15)
BUN: 26 mg/dL — ABNORMAL HIGH (ref 6–20)
CALCIUM: 7.9 mg/dL — AB (ref 8.9–10.3)
CO2: 31 mmol/L (ref 22–32)
Chloride: 102 mmol/L (ref 101–111)
Creatinine, Ser: 1.4 mg/dL — ABNORMAL HIGH (ref 0.44–1.00)
GFR calc Af Amer: 43 mL/min — ABNORMAL LOW (ref >60)
GFR calc non Af Amer: 37 mL/min — ABNORMAL LOW (ref >60)
GLUCOSE: 201 mg/dL — AB (ref 65–99)
POTASSIUM: 3.3 mmol/L — AB (ref 3.5–5.1)
Sodium: 142 mmol/L (ref 135–145)

## 2014-11-16 LAB — GLUCOSE, CAPILLARY
GLUCOSE-CAPILLARY: 202 mg/dL — AB (ref 65–99)
GLUCOSE-CAPILLARY: 188 mg/dL — AB (ref 65–99)
GLUCOSE-CAPILLARY: 193 mg/dL — AB (ref 65–99)
GLUCOSE-CAPILLARY: 213 mg/dL — AB (ref 65–99)
Glucose-Capillary: 259 mg/dL — ABNORMAL HIGH (ref 65–99)

## 2014-11-16 LAB — URINE CULTURE: CULTURE: NO GROWTH

## 2014-11-16 LAB — PHOSPHORUS: Phosphorus: 3 mg/dL (ref 2.5–4.6)

## 2014-11-16 LAB — MAGNESIUM: Magnesium: 1.7 mg/dL (ref 1.7–2.4)

## 2014-11-16 LAB — LACTIC ACID, PLASMA: LACTIC ACID, VENOUS: 1.1 mmol/L (ref 0.5–2.0)

## 2014-11-16 MED ORDER — POTASSIUM CHLORIDE CRYS ER 20 MEQ PO TBCR
40.0000 meq | EXTENDED_RELEASE_TABLET | Freq: Once | ORAL | Status: AC
  Administered 2014-11-16: 40 meq via ORAL
  Filled 2014-11-16: qty 2

## 2014-11-16 MED ORDER — IPRATROPIUM-ALBUTEROL 0.5-2.5 (3) MG/3ML IN SOLN
3.0000 mL | RESPIRATORY_TRACT | Status: DC | PRN
  Administered 2014-11-16 – 2014-11-17 (×3): 3 mL via RESPIRATORY_TRACT
  Filled 2014-11-16 (×3): qty 3

## 2014-11-16 MED ORDER — ALBUTEROL SULFATE (2.5 MG/3ML) 0.083% IN NEBU
INHALATION_SOLUTION | RESPIRATORY_TRACT | Status: AC
  Administered 2014-11-16: 2.5 mg
  Filled 2014-11-16: qty 3

## 2014-11-16 MED ORDER — FUROSEMIDE 10 MG/ML IJ SOLN
40.0000 mg | Freq: Once | INTRAMUSCULAR | Status: AC
  Administered 2014-11-16: 40 mg via INTRAVENOUS

## 2014-11-16 MED ORDER — SPIRONOLACTONE 25 MG PO TABS
25.0000 mg | ORAL_TABLET | Freq: Every day | ORAL | Status: DC
Start: 2014-11-16 — End: 2014-11-18
  Administered 2014-11-16 – 2014-11-18 (×3): 25 mg via ORAL
  Filled 2014-11-16 (×2): qty 1

## 2014-11-16 MED ORDER — FUROSEMIDE 10 MG/ML IJ SOLN
INTRAMUSCULAR | Status: AC
  Filled 2014-11-16: qty 4

## 2014-11-16 MED ORDER — METOLAZONE 2.5 MG PO TABS
2.5000 mg | ORAL_TABLET | ORAL | Status: DC
  Administered 2014-11-16 – 2014-11-18 (×2): 2.5 mg via ORAL
  Filled 2014-11-16 (×2): qty 1

## 2014-11-16 MED ORDER — TORSEMIDE 100 MG PO TABS
100.0000 mg | ORAL_TABLET | Freq: Two times a day (BID) | ORAL | Status: DC
  Administered 2014-11-17 – 2014-11-18 (×3): 100 mg via ORAL
  Filled 2014-11-16 (×6): qty 1

## 2014-11-16 NOTE — Progress Notes (Signed)
11/16/14  1450  Patient c/o SOB. Paged respiratory and neb treatment was recommended. Paged Dr Vaughan Browner per MD he will place order.

## 2014-11-16 NOTE — Progress Notes (Signed)
RT came to the bedside to assess patient. Patient RR 18, O2 saturations 99% on 2 lpm (home O2), and HR 60 bpm. BS dim w/ fine crackles in bases. Patient was oriented and answered appropriately to questions asked by RT. Patient said she felt SOB. RT administered Albuterol via SVN and patient WOB was improved.  Upon talking to the daughter, daughter stated that patient has been diagnosed with OSA, but noncompliant due to lack of mask comfort. Daughter stated that her mother is willing to use CPAP if comfortable. Patient is resting comfortably at this time. RN and MD notified. RT will continue to monitor patient.

## 2014-11-16 NOTE — Progress Notes (Signed)
Melissa Myers [id number] transferred to 1343 in bed with o2 on with NT and transporter. No change in condition. Report was called to [facility].

## 2014-11-16 NOTE — Progress Notes (Signed)
Patient ID: Melissa Myers, female   DOB: 1945/03/15, 69 y.o.   MRN: [id number] Still with the same amount of global redness and cellulitis around her left leg and thigh.  Warm.  No significant improvement in the cellulitis thus far.  Still no palpable area of fluctuance or crepitation.  No abscess on CT scan.  Still no surgical indication.

## 2014-11-16 NOTE — Progress Notes (Signed)
[facility] Progress Note Patient Name: KINESHA AUTEN DOB: 1945/09/29 MRN: [id number]   Date of Service  11/16/2014  HPI/Events of Note  Dyspnea with wheezing. Vascular congestion on CXR earlier today.  eICU Interventions  Stop IV fluids. Repeat CXR. Nebs and lasix 40 mg IV once. Resume outpatient diuretics.      Intervention Category Intermediate Interventions: Respiratory distress - evaluation and management  Praveen Mannam 11/16/2014, 8:27 PM

## 2014-11-16 NOTE — Progress Notes (Signed)
Spoke with pt regarding use of nocturnal cpap.  Pt stated she has tried cpap at home with a full face mask but was not able to wear it.  Consequently, her cpap machine was taken back.  Nasal cpap ineffective due to pt sleeping with her mouth open, which her daughter confirms.  Pt remains on 2.5l [place] as per home use.  Pt was encouraged to call should she change her mind about trying cpap tonight.

## 2014-11-16 NOTE — Progress Notes (Signed)
PULMONARY / CRITICAL CARE MEDICINE   Name: Melissa Myers MRN: [id number] DOB: 1945-08-23    ADMISSION DATE:  11/14/2014 CONSULTATION DATE:  11/14/14  REFERRING MD : Dr. Alvino Myers  CHIEF COMPLAINT: Severe sepsis - unknown source  INITIAL PRESENTATION: Brought by EMS with fever and AMS  STUDIES:  CXR (11/14/14). Cardiomegaly. Pulmonary vascular redistribution and mild interstitial pulmonary edema.  SIGNIFICANT EVENTS: 9/23- Admitted with severe sepsis, lactic acidosis  HISTORY OF PRESENT ILLNESS: Melissa Myers is a 69 y/o woman with past medical history significant for diastolic CHF, DM2, OHS/OSA on home O2 who was recently admitted with sepsis secondary to a UTI (E. Coli - pan sensitive). She was treated with antibiotics and discharged home. Over the last 2 days she has been feeling poorly. The evening of presentation she developed a fever and altered mental status and her husband called EMS. In the ER she was found to have a fever to 105F and a lactate of 5.66 with an anion gap of 14. She was started on broad spectrum antibiotics and PCCM was called for admission due to her elevated lactate.   PAST MEDICAL HISTORY :   has a past medical history of Type II or unspecified type diabetes mellitus without mention of complication, not stated as uncontrolled; Hyperlipidemia; Hypertension; Diastolic CHF; Chronic diarrhea; RLS (restless legs syndrome); Osteoarthritis; Stasis dermatitis; GERD (gastroesophageal reflux disease); On home oxygen therapy; Anemia; Neuropathy; Disc degeneration, lumbar; OSA (obstructive sleep apnea); Deaf; and Shortness of breath.  has past surgical history that includes Tubal ligation (1980); Cholecystectomy (1997); uterine polyp removal (2008); Umbilical hernia repair (1995); Tonsillectomy (1970); and Colonoscopy (N/A, 12/03/2012). Prior to Admission medications   Medication Sig Start Date End Date Taking? Authorizing Provider  aspirin EC 81 MG tablet  Take 1 tablet (81 mg total) by mouth daily. 07/08/14   Melissa Clack, MD  B Complex-C (B-COMPLEX WITH VITAMIN C) tablet Take 1 tablet by mouth daily.    Historical Provider, MD  Blood Glucose Monitoring Suppl (ONETOUCH VERIO FLEX SYSTEM) W/DEVICE KIT 1 kit by Does not apply route once. Dx Code E11.9 10/23/14   Melissa Shin, MD  carvedilol (COREG) 25 MG tablet TAKE 1 TABLET BY MOUTH TWICE DAILY WITH FOOD 11/06/14   Melissa Artist, MD  Cholecalciferol (VITAMIN D3) 2000 UNITS capsule Take 2,000 Units by mouth 4 (four) times daily - after meals and at bedtime.     Historical Provider, MD  diclofenac sodium (VOLTAREN) 1 % GEL Apply 2 g topically daily as needed (knee pain). 10/26/14   Melissa Reasons, MD  ezetimibe (ZETIA) 10 MG tablet Take 1 tablet (10 mg total) by mouth daily. 07/08/14   Melissa Clack, MD  fenofibrate 160 MG tablet TAKE 1 TABLET BY MOUTH DAILY 09/04/14   Melissa Clack, MD  gabapentin (NEURONTIN) 300 MG capsule Take 1 capsule (300 mg total) by mouth 4 (four) times daily. Patient taking differently: Take 600 mg by mouth 4 (four) times daily.  07/08/14   Melissa Clack, MD  Glucosamine HCl 1000 MG TABS Take 1,000 mg by mouth 2 (two) times daily.    Historical Provider, MD  glucose blood (ONETOUCH VERIO) test strip Test 3 time a day 10/22/14   Melissa Shin, MD  hydrALAZINE (APRESOLINE) 25 MG tablet TAKE 1 TABLET (25 MG TOTAL) BY MOUTH 3 (THREE) TIMES DAILY. 10/28/14   Melissa Artist, MD  hydrOXYzine (ATARAX/VISTARIL) 10 MG tablet Take 10 mg by mouth 3 (three) times daily as needed for anxiety.  Historical Provider, MD  insulin regular (HUMULIN R) 100 units/mL injection Inject 0.7 mLs (70 Units total) into the skin 3 (three) times daily before meals. 11/12/14   Melissa Shin, MD  Insulin Syringe-Needle U-100 (B-D INSULIN SYRINGE 1CC/27G) 27G X 1/2" 1 ML MISC Use to inject insulin 4 times daily as advised. 06/02/14   Melissa Snare, MD  ketoconazole (NIZORAL) 2 % cream Apply 1 application  topically daily. 11/05/14   Melissa Limes, MD  lactobacillus (FLORANEX/LACTINEX) PACK Take 1 packet (1 g total) by mouth 3 (three) times daily with meals. 10/26/14   Melissa Reasons, MD  Magnesium 500 MG TABS Take 500 mg by mouth daily.    Historical Provider, MD  metolazone (ZAROXOLYN) 2.5 MG tablet Take 1 tablet (2.5 mg total) by mouth every other day. 07/24/14   Melissa Dresser, MD  omega-3 acid ethyl esters (LOVAZA) 1 G capsule Take 1 g by mouth 2 (two) times daily.     Historical Provider, MD  ONE TOUCH LANCETS MISC Use one lancet per blood sugar testing.3 times a day 10/22/14   Melissa Shin, MD  oxycodone (OXY-IR) 5 MG capsule Take 5 mg by mouth every 6 (six) hours.    Historical Provider, MD  potassium chloride SA (K-DUR,KLOR-CON) 20 MEQ tablet Take 40 mEq by mouth 2 (two) times daily.    Historical Provider, MD  rOPINIRole (REQUIP) 2 MG tablet Take 2 mg by mouth 2 (two) times daily.    Historical Provider, MD  senna-docusate (SENOKOT-S) 8.6-50 MG per tablet Take 2 tablets by mouth 2 (two) times daily. 10/26/14   Melissa Reasons, MD  sertraline (ZOLOFT) 25 MG tablet Take 1 tablet (25 mg total) by mouth daily. 10/26/14   Melissa Reasons, MD  spironolactone (ALDACTONE) 25 MG tablet Take 1 tablet (25 mg total) by mouth daily. 07/08/14   Melissa Clack, MD  torsemide (DEMADEX) 100 MG tablet Take 1 tablet (100 mg total) by mouth 2 (two) times daily. 10/10/14   Melissa Dresser, MD  triamcinolone cream (KENALOG) 0.1 % Apply 1 application topically 2 (two) times daily. Patient taking differently: Apply 1 application topically 2 (two) times daily as needed (for rash).  10/09/14   Melissa Circle, FNP   Allergies  Allergen Reactions  . Cymbalta [Duloxetine Hcl] Other (See Comments)    Restless leg syndrome  . Statins Other (See Comments)    Statin drugs cause muscle pain / "muscle damage"--was told by MD not to take  . Sulfa Antibiotics Diarrhea  . Levemir [Insulin Detemir] Itching  . Morphine Other (See Comments)    GI  upset and headaches  . Zinc Swelling and Rash    FAMILY HISTORY:  indicated that her mother is alive. She indicated that her father is deceased.  SOCIAL HISTORY:  reports that she has never smoked. She has never used smokeless tobacco. She reports that she does not drink alcohol or use illicit drugs.  REVIEW OF SYSTEMS:    SUBJECTIVE: States that generalized body pain is much better. Still has some aches in the legs.  VITAL SIGNS: Temp:  [100.8 F (38.2 C)-103.6 F (39.8 C)] 101.1 F (38.4 C) (09/25 0700) Pulse Rate:  [63-85] 69 (09/25 0700) Resp:  [15-30] 28 (09/25 0700) BP: (107-148)/(28-66) 145/55 mmHg (09/25 0700) SpO2:  [92 %-98 %] 98 % (09/25 0700) Weight:  [283 lb 11.7 oz (128.7 kg)] 283 lb 11.7 oz (128.7 kg) (09/25 0500) HEMODYNAMICS:   VENTILATOR SETTINGS:   INTAKE / OUTPUT:  Intake/Output Summary (Last 24 hours) at 11/16/14 0919 Last data filed at 11/16/14 0700  Gross per 24 hour  Intake   3200 ml  Output   1750 ml  Net   1450 ml    PHYSICAL EXAMINATION: General:  Awake, responsive, Mild distress Neuro:  No focal deficits HEENT:  PERRL, Moist mucus membranes Cardiovascular:  RRR, No MRG Lungs:  Clear antr Abdomen:  Soft, + BS  LABS:  CBC  Recent Labs Lab 11/14/14 2203 11/14/14 2207 11/15/14 0407 11/16/14 0400  WBC 14.8*  --  24.9* 17.8*  HGB 12.4 14.3 10.9* 9.4*  HCT 39.8 42.0 35.5* 30.1*  PLT 312  --  311 214   Coag's  Recent Labs Lab 11/14/14 2333  APTT 28  INR 1.09   BMET  Recent Labs Lab 11/14/14 2203 11/14/14 2207 11/15/14 0407 11/16/14 0400  NA 142 139 143 142  K 4.0 3.9 3.1* 3.3*  CL 93* 94* 96* 102  CO2 35*  --  37* 31  BUN 27* 29* 29* 26*  CREATININE 1.27* 1.20* 1.38* 1.40*  GLUCOSE 172* 174* 173* 201*   Electrolytes  Recent Labs Lab 11/14/14 2203 11/15/14 0407 11/16/14 0400  CALCIUM 10.0 8.6* 7.9*  MG  --   --  1.7  PHOS  --   --  3.0   Sepsis Markers  Recent Labs Lab 11/15/14 0727 11/15/14 1145  11/16/14 0400  LATICACIDVEN 2.3* 2.9* 1.1   ABG No results for input(s): PHART, PCO2ART, PO2ART in the last 168 hours. Liver Enzymes  Recent Labs Lab 11/14/14 2203  AST 41  ALT 24  ALKPHOS 67  BILITOT 0.5  ALBUMIN 3.9   Cardiac Enzymes No results for input(s): TROPONINI, PROBNP in the last 168 hours. Glucose  Recent Labs Lab 11/15/14 1135 11/15/14 1556 11/15/14 2013 11/15/14 2320 11/16/14 0430 11/16/14 0746  GLUCAP 171* 260* 205* 233* 202* 188*    Imaging Dg Chest Port 1 View  11/16/2014   CLINICAL DATA:  Acute respiratory failure.  CHF.  EXAM: PORTABLE CHEST 1 VIEW  COMPARISON:  11/14/2014  FINDINGS: Cardiomegaly with vascular congestion. Improving interstitial edema pattern with mild interstitial edema. No confluent opacities or effusions. No acute bony abnormality.  IMPRESSION: Cardiomegaly.  Improving interstitial edema.   Electronically Signed   By: Rolm Baptise M.D.   On: 11/16/2014 07:30     ASSESSMENT / PLAN:  PULMONARY A:OSA/OHS P:  Continue home O2 - does not tolerate CPAP  CARDIOVASCULAR  A: Hypertensive on presentation P:  Lactate 5.66 on presentation. Improved with hydration. Was hypotensive but responded to fluids.  RENAL A: AKI P:  Cr on admission 1.27 - baseline appears to be about 1 Will monitor.  HEMATOLOGIC A: At risk for VTE No DVT on LE dopplers. P:   INFECTIOUS A: Cellulitis vs. Deeper soft tissue infection - concern for necrotizing fasciitis P:  BCx2 obtained 9/23- GPC bacteremia UC UA negative Abx: Vancomycin, Zosyn, Clindamycin and anidulofungin, start date 9/23, day 3/- Can d/c the Clindamycin and adnidulofungin. Will narrow further after the GPCs cultures are finalized. No need for surgery as per Ortho.  ENDOCRINE A: DM2  P:  SSI  FAMILY  - Updates:  - Inter-disciplinary family meet or Palliative Care meeting due by: day 7  TODAY'S SUMMARY: Admitted with sepsis likely from LE cellulitis.  Improving hemodynamics and lactic acid.   Marshell Garfinkel MD [facility] Pager [phone] If no answer or after 3pm call: [phone] 11/16/2014, 9:19  AM

## 2014-11-17 DIAGNOSIS — I8310 Varicose veins of unspecified lower extremity with inflammation: Secondary | ICD-10-CM

## 2014-11-17 DIAGNOSIS — L03116 Cellulitis of left lower limb: Secondary | ICD-10-CM | POA: Diagnosis present

## 2014-11-17 DIAGNOSIS — G92 Toxic encephalopathy: Secondary | ICD-10-CM | POA: Diagnosis present

## 2014-11-17 DIAGNOSIS — I5032 Chronic diastolic (congestive) heart failure: Secondary | ICD-10-CM

## 2014-11-17 DIAGNOSIS — N179 Acute kidney failure, unspecified: Secondary | ICD-10-CM | POA: Diagnosis present

## 2014-11-17 DIAGNOSIS — E1142 Type 2 diabetes mellitus with diabetic polyneuropathy: Secondary | ICD-10-CM

## 2014-11-17 DIAGNOSIS — G928 Other toxic encephalopathy: Secondary | ICD-10-CM | POA: Diagnosis present

## 2014-11-17 LAB — GLUCOSE, CAPILLARY
GLUCOSE-CAPILLARY: 243 mg/dL — AB (ref 65–99)
GLUCOSE-CAPILLARY: 253 mg/dL — AB (ref 65–99)
Glucose-Capillary: 210 mg/dL — ABNORMAL HIGH (ref 65–99)
Glucose-Capillary: 218 mg/dL — ABNORMAL HIGH (ref 65–99)
Glucose-Capillary: 308 mg/dL — ABNORMAL HIGH (ref 65–99)
Glucose-Capillary: 364 mg/dL — ABNORMAL HIGH (ref 65–99)

## 2014-11-17 LAB — BASIC METABOLIC PANEL
ANION GAP: 8 (ref 5–15)
BUN: 18 mg/dL (ref 6–20)
CALCIUM: 8.6 mg/dL — AB (ref 8.9–10.3)
CO2: 34 mmol/L — ABNORMAL HIGH (ref 22–32)
Chloride: 99 mmol/L — ABNORMAL LOW (ref 101–111)
Creatinine, Ser: 1.06 mg/dL — ABNORMAL HIGH (ref 0.44–1.00)
GFR, EST NON AFRICAN AMERICAN: 52 mL/min — AB (ref >60)
Glucose, Bld: 248 mg/dL — ABNORMAL HIGH (ref 65–99)
Potassium: 3.6 mmol/L (ref 3.5–5.1)
SODIUM: 141 mmol/L (ref 135–145)

## 2014-11-17 LAB — CBC
HCT: 30.2 % — ABNORMAL LOW (ref 36.0–46.0)
Hemoglobin: 9.3 g/dL — ABNORMAL LOW (ref 12.0–15.0)
MCH: 26.6 pg (ref 26.0–34.0)
MCHC: 30.8 g/dL (ref 30.0–36.0)
MCV: 86.3 fL (ref 78.0–100.0)
PLATELETS: 229 10*3/uL (ref 150–400)
RBC: 3.5 MIL/uL — ABNORMAL LOW (ref 3.87–5.11)
RDW: 16.5 % — AB (ref 11.5–15.5)
WBC: 12.5 10*3/uL — AB (ref 4.0–10.5)

## 2014-11-17 LAB — CULTURE, BLOOD (ROUTINE X 2)

## 2014-11-17 MED ORDER — DEXTROSE 5 % IV SOLN
2.0000 g | INTRAVENOUS | Status: DC
  Filled 2014-11-17: qty 2

## 2014-11-17 MED ORDER — INSULIN ASPART 100 UNIT/ML ~~LOC~~ SOLN
0.0000 [IU] | Freq: Three times a day (TID) | SUBCUTANEOUS | Status: DC
  Administered 2014-11-17: 11 [IU] via SUBCUTANEOUS
  Administered 2014-11-17: 7 [IU] via SUBCUTANEOUS
  Administered 2014-11-18: 15 [IU] via SUBCUTANEOUS
  Administered 2014-11-18: 7 [IU] via SUBCUTANEOUS
  Administered 2014-11-18 – 2014-11-19 (×3): 11 [IU] via SUBCUTANEOUS
  Administered 2014-11-19: 15 [IU] via SUBCUTANEOUS
  Administered 2014-11-20: 11 [IU] via SUBCUTANEOUS
  Administered 2014-11-20: 7 [IU] via SUBCUTANEOUS

## 2014-11-17 MED ORDER — DEXTROSE 5 % IV SOLN
1.0000 g | INTRAVENOUS | Status: AC
  Administered 2014-11-17: 1 g via INTRAVENOUS
  Filled 2014-11-17 (×2): qty 10

## 2014-11-17 MED ORDER — INSULIN ASPART 100 UNIT/ML ~~LOC~~ SOLN
0.0000 [IU] | Freq: Every day | SUBCUTANEOUS | Status: DC
  Administered 2014-11-17: 5 [IU] via SUBCUTANEOUS
  Administered 2014-11-19 (×2): 3 [IU] via SUBCUTANEOUS

## 2014-11-17 MED ORDER — DEXTROSE 5 % IV SOLN
1.0000 g | INTRAVENOUS | Status: DC
  Filled 2014-11-17: qty 10

## 2014-11-17 MED ORDER — DEXTROSE 5 % IV SOLN
1.0000 g | INTRAVENOUS | Status: DC
  Administered 2014-11-17: 1 g via INTRAVENOUS
  Filled 2014-11-17: qty 10

## 2014-11-17 MED ORDER — INSULIN GLARGINE 100 UNIT/ML ~~LOC~~ SOLN
20.0000 [IU] | Freq: Every day | SUBCUTANEOUS | Status: DC
  Administered 2014-11-17: 20 [IU] via SUBCUTANEOUS
  Filled 2014-11-17 (×2): qty 0.2

## 2014-11-17 MED ORDER — GABAPENTIN 400 MG PO CAPS
400.0000 mg | ORAL_CAPSULE | Freq: Three times a day (TID) | ORAL | Status: DC
  Administered 2014-11-17 – 2014-11-20 (×9): 400 mg via ORAL
  Filled 2014-11-17 (×9): qty 1

## 2014-11-17 NOTE — Care Management Important Message (Signed)
Important Message  Patient Details IM Letter given to Nora/Case Manager to present to [place] Message  Patient Details  Name: Melissa Myers MRN: [id number] Date of Birth: 08-11-1945   Medicare Important Message Given:  Yes-second notification given    Camillo Flaming 11/17/2014, 10:56 AM Name: Melissa Myers MRN: [id number] Date of Birth: December 01, 1945   Medicare Important Message Given:  Yes-second notification given    Camillo Flaming 11/17/2014, 10:55 AM

## 2014-11-17 NOTE — Progress Notes (Signed)
ANTIBIOTIC CONSULT NOTE - INITIAL  Pharmacy Consult for Ceftriaxone Indication: Cellulitis  Allergies  Allergen Reactions  . Cymbalta [Duloxetine Hcl] Other (See Comments)    Restless leg syndrome  . Statins Other (See Comments)    Statin drugs cause muscle pain / "muscle damage"--was told by MD not to take  . Sulfa Antibiotics Diarrhea  . Levemir [Insulin Detemir] Itching  . Morphine Other (See Comments)    GI upset and headaches  . Zinc Swelling and Rash    Patient Measurements: Height: 4\' 11"  (149.9 cm) Weight: 281 lb 15.5 oz (127.9 kg) IBW/kg (Calculated) : 43.2 Adjusted Body Weight:   Vital Signs: Temp: 99.9 F (37.7 C) (09/26 0956) Temp Source: Oral (09/26 0500) BP: 140/66 mmHg (09/26 0956) Pulse Rate: 60 (09/26 0956) Intake/Output from previous day: 09/25 0701 - 09/26 0700 In: 2035 [P.O.:1440; I.V.:1205; IV Piggyback:600] Out: 3625 [Urine:3625] Intake/Output from this shift:    Labs:  Recent Labs  11/15/14 0407 11/16/14 0400 11/17/14 0405  WBC 24.9* 17.8* 12.5*  HGB 10.9* 9.4* 9.3*  PLT 311 214 229  CREATININE 1.38* 1.40* 1.06*   Estimated Creatinine Clearance: 61 mL/min (by C-G formula based on Cr of 1.06). No results for input(s): VANCOTROUGH, VANCOPEAK, VANCORANDOM, GENTTROUGH, GENTPEAK, GENTRANDOM, TOBRATROUGH, TOBRAPEAK, TOBRARND, AMIKACINPEAK, AMIKACINTROU, AMIKACIN in the last 72 hours.   Microbiology: Recent Results (from the past 720 hour(s))  Urine culture     Status: None   Collection Time: 10/20/14  3:20 PM  Result Value Ref Range Status   Specimen Description URINE, RANDOM  Final   Special Requests NONE  Final   Culture >=100,000 COLONIES/mL ESCHERICHIA COLI  Final   Report Status 10/22/2014 FINAL  Final   Organism ID, Bacteria ESCHERICHIA COLI  Final      Susceptibility   Escherichia coli - MIC*    AMPICILLIN 4 SENSITIVE Sensitive     CEFAZOLIN <=4 SENSITIVE Sensitive     CEFTRIAXONE <=1 SENSITIVE Sensitive     CIPROFLOXACIN  <=0.25 SENSITIVE Sensitive     GENTAMICIN <=1 SENSITIVE Sensitive     IMIPENEM <=0.25 SENSITIVE Sensitive     NITROFURANTOIN <=16 SENSITIVE Sensitive     TRIMETH/SULFA <=20 SENSITIVE Sensitive     AMPICILLIN/SULBACTAM 4 SENSITIVE Sensitive     PIP/TAZO <=4 SENSITIVE Sensitive     * >=100,000 COLONIES/mL ESCHERICHIA COLI  Blood Culture (routine x 2)     Status: None   Collection Time: 10/22/14  8:32 PM  Result Value Ref Range Status   Specimen Description BLOOD RIGHT HAND  Final   Special Requests BOTTLES DRAWN AEROBIC AND ANAEROBIC 5CC  Final   Culture NO GROWTH 5 DAYS  Final   Report Status 10/28/2014 FINAL  Final  Blood Culture (routine x 2)     Status: None   Collection Time: 10/22/14 11:25 PM  Result Value Ref Range Status   Specimen Description BLOOD LEFT HAND  Final   Special Requests BOTTLES DRAWN AEROBIC ONLY 2CC  Final   Culture NO GROWTH 5 DAYS  Final   Report Status 10/28/2014 FINAL  Final  MRSA PCR Screening     Status: None   Collection Time: 10/23/14 10:48 AM  Result Value Ref Range Status   MRSA by PCR NEGATIVE NEGATIVE Final    Comment:        The GeneXpert MRSA Assay (FDA approved for NASAL specimens only), is one component of a comprehensive MRSA colonization surveillance program. It is not intended to diagnose MRSA infection nor to guide  or monitor treatment for MRSA infections.   Culture, blood (routine x 2)     Status: None   Collection Time: 11/14/14 10:03 PM  Result Value Ref Range Status   Specimen Description BLOOD RIGHT HAND  Final   Special Requests IN PEDIATRIC BOTTLE 3CC  Final   Culture  Setup Time   Final    GRAM POSITIVE COCCI IN CHAINS AEROBIC BOTTLE ONLY CRITICAL RESULT CALLED TO, READ BACK BY AND VERIFIED WITH: Doree Barthel 11/15/14 @ 0929 M VESTAL    Culture   Final    STREPTOCOCCUS AGALACTIAE Performed at [facility]    Report Status 11/17/2014 FINAL  Final   Organism ID, Bacteria STREPTOCOCCUS AGALACTIAE  Final       Susceptibility   Streptococcus agalactiae - MIC*    CLINDAMYCIN <=0.25 SENSITIVE Sensitive     AMPICILLIN <=0.25 SENSITIVE Sensitive     ERYTHROMYCIN 2 RESISTANT Resistant     VANCOMYCIN 0.5 SENSITIVE Sensitive     CEFTRIAXONE <=0.12 SENSITIVE Sensitive     LEVOFLOXACIN 0.5 SENSITIVE Sensitive     * STREPTOCOCCUS AGALACTIAE  Urine culture     Status: None   Collection Time: 11/14/14 10:38 PM  Result Value Ref Range Status   Specimen Description URINE, CATHETERIZED  Final   Special Requests NONE  Final   Culture   Final    NO GROWTH 1 DAY Performed at [facility]    Report Status 11/16/2014 FINAL  Final  MRSA PCR Screening     Status: None   Collection Time: 11/15/14  5:48 AM  Result Value Ref Range Status   MRSA by PCR NEGATIVE NEGATIVE Final    Comment:        The GeneXpert MRSA Assay (FDA approved for NASAL specimens only), is one component of a comprehensive MRSA colonization surveillance program. It is not intended to diagnose MRSA infection nor to guide or monitor treatment for MRSA infections.   C difficile quick scan w PCR reflex     Status: Abnormal   Collection Time: 11/16/14 12:30 PM  Result Value Ref Range Status   C Diff antigen POSITIVE (A) NEGATIVE Final   C Diff toxin NEGATIVE NEGATIVE Final   C Diff interpretation   Final    C. difficile present, but toxin not detected. This indicates colonization. In most cases, this does not require treatment. If patient has signs and symptoms consistent with colitis, consider treatment.    Medical History: Past Medical History  Diagnosis Date  . Type II or unspecified type diabetes mellitus without mention of complication, not stated as uncontrolled     insulin dep  . Hyperlipidemia     hx rhabdo on statins  . Hypertension   . Diastolic CHF   . Chronic diarrhea     a/w nausea - felt related to IBS  . RLS (restless legs syndrome)   . Osteoarthritis   . Stasis dermatitis   . GERD (gastroesophageal  reflux disease)   . On home oxygen therapy     uses oxygen 2 liters min per [place] at night and prn during day  . Anemia   . Neuropathy     feet, toes and fingers  . Disc degeneration, lumbar   . OSA (obstructive sleep apnea)     05/2009 sleep study - refuses CPAP  . Deaf     left side only  . Shortness of breath     chronic   Assessment: 69 yoF on IV vancomycin  and zosyn for severe sepsis secondary to LE cellulitis from streptococcus.  No abscess on CT and no surgical plans noted per ortho. Lactate clearing, fever curve and WBC improving.  Antibiotics narrowed to Ceftriaxone on 9/26.   Goal of Therapy:  Eradication of infection  Plan:  Start Ceftriaxone 1g IV q24h.  Need for further dosage adjustment appears unlikely at present.  Will sign off at this time.  Please reconsult if a change in clinical status warrants re-evaluation of dosage.  Thanks! Ralene Bathe, PharmD, BCPS 11/17/2014, 12:27 PM  Pager: [phone]

## 2014-11-17 NOTE — Progress Notes (Signed)
Pt refused nocturnal CPAP.  Pt will contact Rt should she decide to wear CPAP.

## 2014-11-17 NOTE — Progress Notes (Signed)
Pharmacy: Re- Ceftriaxone  Blood culture with strep agalactiae (S= amp, CTX, clin, LVQ, vanc).  Will change ceftriaxone dose to 2gm IV q24h.  Dia Sitter, PharmD, BCPS 11/17/2014 6:16 PM

## 2014-11-17 NOTE — Clinical Documentation Improvement (Signed)
Hospitalist  Can the diagnosis of altered mental status be further specified?   Confusion/delirium (including drug induced)  Encephalopathy - Alcoholic, Anoxic/Hypoxia, Drug Induced/Toxic (specify drug), Hepatic, Hypertensive, Hypoglycemic, Metabolic/Septic, Traumatic/post concussive, Wernicke, Other  Other  Clinically Undetermined  Document any associated diagnoses/conditions.   Supporting Information: Patient with AMS per 9/24 progress notes.   Please exercise your independent, professional judgment when responding. A specific answer is not anticipated or expected.   Thank You,  [place] [phone]

## 2014-11-17 NOTE — Progress Notes (Signed)
Pt received prn neb treatment at 1925. At 2010, pt still complaining of SOB and had audible wheezing. She stated the neb didn't help at all. O2 sat 100% on 2.5L O2. Paged on call MD and received orders to stop IVF, give one time dose of lasix and check CXR. Home PO diuretics also reordered. One hour after receiving lasix, pt reported breathing was much better and I no longer heard any wheezing. Good output into foley bag noted. Continue to monitor. Hortencia Conradi RN

## 2014-11-17 NOTE — Progress Notes (Signed)
PROGRESS NOTE  Melissa Myers KVQ:[id number] DOB: 1945/12/24 DOA: 11/14/2014 PCP: Gwendolyn Grant, MD  HPI/Recap of past 64 hours: 69 year old female past oral history of diastolic heart failure, diabetes mellitus and obstructive sleep apnea on chronic oxygen who had a recent admission for a UTI sepsis with pansensitive Escherichia coli discharged on 9/4 readmitted on 9/21 after 2 days of fever, weakness and brought in after she started developing confusion. On admission, patient found to be in septic shock which after initial workup felt to be secondary to cellulitis. Patient after several days of fluids, IV antibiotics and resuscitation improved and she was transferred to the floor with hospitalist taking over as attending on 9/26  Patient today still complains of feeling very weak, lower extremity pain. With deep inspiration, she is coughing clear sputum.  By 9/23, blood cultures positive for Streptococcus agalactiae.    Assessment/Plan: Active Problems:   Morbid obesity: Patient meets criteria with BMI greater than 40    Essential hypertension: Patient restarted back on all of her diuretic medications. We'll taper down IV fluids.   Chronic diastolic heart failure: Watching daily weights. Patient is actually down 4-6 pounds from admission. Continue diuretic's, taper off IV fluids   Sleep apnea: On CPAP   Stasis dermatitis  Toxic metabolic encephalopathy: Secondary to sepsis. Resolved, patient now appropriate    Severe sepsis from Streptococcus secondary to lower extremity cellulitis: Patient meets criteria given leukocytosis, lactic acidosis, tachycardia with cellulitis as source. Resolved. Continue antibiotics and have narrowed down to Rocephin.    AKI (acute kidney injury): Secondary to shock. Treat with IV fluids, resolved    Uncontrolled type 2 diabetes mellitus with peripheral neuropathy: Appreciate help from diabetic educator. On Lantus plus sliding scale. A1c on 9/1 at 8.7.  Continue Neurontin, adjusted for age as per pharmacy    Code Status: Full code  Family Communication: Left message with family  Disposition Plan: Improving, anticipate discharge later this week   Consultants:  Pulmonary  Procedures:  None  Antibiotics:  IV Zosyn 9/21-9/26  IV vancomycin 9/21-9/26  IV Rocephin 9/26-present   Objective: BP 140/66 mmHg  Pulse 60  Temp(Src) 99.9 F (37.7 C) (Oral)  Resp 16  Ht 4\' 11"  (1.499 m)  Wt 127.9 kg (281 lb 15.5 oz)  BMI 56.92 kg/m2  SpO2 98%  Intake/Output Summary (Last 24 hours) at 11/17/14 1155 Last data filed at 11/17/14 0852  Gross per 24 hour  Intake   2325 ml  Output   3200 ml  Net   -875 ml   Filed Weights   11/16/14 0500 11/16/14 1255 11/17/14 0500  Weight: 128.7 kg (283 lb 11.7 oz) 129.3 kg (285 lb 0.9 oz) 127.9 kg (281 lb 15.5 oz)    Exam:   General:  Alert and oriented 3, fatigue, mild distress secondary to feeling very worn out  Cardiovascular: Regular rate and rhythm, S1-S2  Respiratory: Decreased breath sounds throughout secondary to body habitus  Abdomen: Soft, obese, nontender, positive bowel sounds  Musculoskeletal: 1+ pitting edema from the knees down bilaterally, some erythema noted on left as well as chronic venous stasis bilaterally  Data Reviewed: Basic Metabolic Panel:  Recent Labs Lab 11/14/14 2203 11/14/14 2207 11/15/14 0407 11/16/14 0400 11/17/14 0405  NA 142 139 143 142 141  K 4.0 3.9 3.1* 3.3* 3.6  CL 93* 94* 96* 102 99*  CO2 35*  --  37* 31 34*  GLUCOSE 172* 174* 173* 201* 248*  BUN 27* 29* 29* 26* 18  CREATININE 1.27* 1.20* 1.38* 1.40* 1.06*  CALCIUM 10.0  --  8.6* 7.9* 8.6*  MG  --   --   --  1.7  --   PHOS  --   --   --  3.0  --    Liver Function Tests:  Recent Labs Lab 11/14/14 2203  AST 41  ALT 24  ALKPHOS 67  BILITOT 0.5  PROT 8.0  ALBUMIN 3.9   No results for input(s): LIPASE, AMYLASE in the last 168 hours. No results for input(s): AMMONIA in the  last 168 hours. CBC:  Recent Labs Lab 11/14/14 2203 11/14/14 2207 11/15/14 0407 11/16/14 0400 11/17/14 0405  WBC 14.8*  --  24.9* 17.8* 12.5*  NEUTROABS 12.5*  --   --   --   --   HGB 12.4 14.3 10.9* 9.4* 9.3*  HCT 39.8 42.0 35.5* 30.1* 30.2*  MCV 86.3  --  86.6 85.8 86.3  PLT 312  --  311 214 229   Cardiac Enzymes:    Recent Labs Lab 11/14/14 2333  CKTOTAL 110   BNP (last 3 results) No results for input(s): BNP in the last 8760 hours.  ProBNP (last 3 results)  Recent Labs  12/05/13 1700  PROBNP 52.4    CBG:  Recent Labs Lab 11/16/14 1622 11/16/14 2014 11/17/14 0008 11/17/14 0438 11/17/14 0737  GLUCAP 213* 259* 308* 243* 210*    Recent Results (from the past 240 hour(s))  Culture, blood (routine x 2)     Status: None   Collection Time: 11/14/14 10:03 PM  Result Value Ref Range Status   Specimen Description BLOOD RIGHT HAND  Final   Special Requests IN PEDIATRIC BOTTLE 3CC  Final   Culture  Setup Time   Final    GRAM POSITIVE COCCI IN CHAINS AEROBIC BOTTLE ONLY CRITICAL RESULT CALLED TO, READ BACK BY AND VERIFIED WITH: Doree Barthel 11/15/14 @ 0929 M VESTAL    Culture   Final    STREPTOCOCCUS AGALACTIAE Performed at [facility]    Report Status 11/17/2014 FINAL  Final   Organism ID, Bacteria STREPTOCOCCUS AGALACTIAE  Final      Susceptibility   Streptococcus agalactiae - MIC*    CLINDAMYCIN <=0.25 SENSITIVE Sensitive     AMPICILLIN <=0.25 SENSITIVE Sensitive     ERYTHROMYCIN 2 RESISTANT Resistant     VANCOMYCIN 0.5 SENSITIVE Sensitive     CEFTRIAXONE <=0.12 SENSITIVE Sensitive     LEVOFLOXACIN 0.5 SENSITIVE Sensitive     * STREPTOCOCCUS AGALACTIAE  Urine culture     Status: None   Collection Time: 11/14/14 10:38 PM  Result Value Ref Range Status   Specimen Description URINE, CATHETERIZED  Final   Special Requests NONE  Final   Culture   Final    NO GROWTH 1 DAY Performed at [facility]    Report Status 11/16/2014 FINAL  Final   MRSA PCR Screening     Status: None   Collection Time: 11/15/14  5:48 AM  Result Value Ref Range Status   MRSA by PCR NEGATIVE NEGATIVE Final    Comment:        The GeneXpert MRSA Assay (FDA approved for NASAL specimens only), is one component of a comprehensive MRSA colonization surveillance program. It is not intended to diagnose MRSA infection nor to guide or monitor treatment for MRSA infections.   C difficile quick scan w PCR reflex     Status: Abnormal   Collection Time: 11/16/14 12:30 PM  Result  Value Ref Range Status   C Diff antigen POSITIVE (A) NEGATIVE Final   C Diff toxin NEGATIVE NEGATIVE Final   C Diff interpretation   Final    C. difficile present, but toxin not detected. This indicates colonization. In most cases, this does not require treatment. If patient has signs and symptoms consistent with colitis, consider treatment.     Studies: Dg Chest Port 1 View  11/16/2014   CLINICAL DATA:  Acute respiratory failure.  Shortness of breath.  EXAM: PORTABLE CHEST 1 VIEW  COMPARISON:  Earlier this day at 0441 hour  FINDINGS: Cardiomegaly is unchanged. Persistent but decreased pulmonary edema. No confluent airspace disease, pleural effusion or pneumothorax. Old right rib fractures.  IMPRESSION: Improving pulmonary edema. Unchanged cardiomegaly. No new abnormality.   Electronically Signed   By: Jeb Levering M.D.   On: 11/16/2014 21:20    Scheduled Meds: . acetaminophen  1,000 mg Oral Once  . gabapentin  400 mg Oral TID  . heparin  5,000 Units Subcutaneous 3 times per day  . insulin aspart  0-20 Units Subcutaneous TID WC  . insulin aspart  0-5 Units Subcutaneous QHS  . insulin glargine  20 Units Subcutaneous QHS  . metolazone  2.5 mg Oral QODAY  . rOPINIRole  2 mg Oral BID  . sertraline  25 mg Oral Daily  . spironolactone  25 mg Oral Daily  . torsemide  100 mg Oral BID    Continuous Infusions: . sodium chloride Stopped (11/17/14 0139)     Time spent: 25  minutes  [facility] Pager [phone]. If 7PM-7AM, please contact night-coverage at [email or web address], password [facility] 11/17/2014, 11:55 AM  LOS: 2 days

## 2014-11-17 NOTE — Progress Notes (Signed)
Inpatient Diabetes Program Recommendations  AACE/ADA: New Consensus Statement on Inpatient Glycemic Control (2015)  Target Ranges:  Prepandial:   less than 140 mg/dL      Peak postprandial:   less than 180 mg/dL (1-2 hours)      Critically ill patients:  140 - 180 mg/dL    Results for Melissa Myers, Melissa Myers (MRN [id number]) as of 11/17/2014 11:08  Ref. Range 11/15/2014 23:20 11/16/2014 04:30 11/16/2014 07:46 11/16/2014 11:55 11/16/2014 16:22 11/16/2014 20:14  Glucose-Capillary Latest Ref Range: 65-99 mg/dL 233 (H) 202 (H) 188 (H) 193 (H) 213 (H) 259 (H)    Results for [name], [name] (MRN [id number]) as of 11/17/2014 11:08  Ref. Range 11/17/2014 00:08 11/17/2014 04:38 11/17/2014 07:37  Glucose-Capillary Latest Ref Range: 65-99 mg/dL 308 (H) 243 (H) 210 (H)     Admit with: Sepsis  History: DM, CHF, OSA  Home DM Meds: Regular insulin- 70 units tid with meals  Current DM Orders: Novolog Resistant SSI (0-20 units) Q4 hours     -Note patient saw her Endocrinologist (Dr. Renato Shin with [facility]) on 11/12/14.  At that visit, patient was instructed to stop taking her NPH insulin and to increase her Regular insulin to 70 units tid with meals.  -Glucose levels sustained >200 mg/dl here in the hospital setting.    MD- Please consider the following:  1. Change diet to Carbohydrate Modified diet (currently ordered as Regular diet without carbohydrate restrictions)  2. Change Novolog Resistant SSI to TID AC + HS (currently ordered as Q4 hours)  3. Add basal insulin- Lantus 20 units daily (0.15 units/kg dosing)    Will follow [name] RN, MSN, CDE Diabetes Coordinator Inpatient Glycemic Control Team Team Pager: [phone] (8a-5p)

## 2014-11-18 DIAGNOSIS — A409 Streptococcal sepsis, unspecified: Principal | ICD-10-CM

## 2014-11-18 LAB — BASIC METABOLIC PANEL
Anion gap: 10 (ref 5–15)
BUN: 22 mg/dL — AB (ref 6–20)
CALCIUM: 9.2 mg/dL (ref 8.9–10.3)
CHLORIDE: 86 mmol/L — AB (ref 101–111)
CO2: 42 mmol/L — AB (ref 22–32)
CREATININE: 1.17 mg/dL — AB (ref 0.44–1.00)
GFR calc non Af Amer: 46 mL/min — ABNORMAL LOW (ref >60)
GFR, EST AFRICAN AMERICAN: 54 mL/min — AB (ref >60)
Glucose, Bld: 298 mg/dL — ABNORMAL HIGH (ref 65–99)
Potassium: 3.2 mmol/L — ABNORMAL LOW (ref 3.5–5.1)
SODIUM: 138 mmol/L (ref 135–145)

## 2014-11-18 LAB — CBC
HCT: 33.2 % — ABNORMAL LOW (ref 36.0–46.0)
Hemoglobin: 10.2 g/dL — ABNORMAL LOW (ref 12.0–15.0)
MCH: 26.1 pg (ref 26.0–34.0)
MCHC: 30.7 g/dL (ref 30.0–36.0)
MCV: 84.9 fL (ref 78.0–100.0)
PLATELETS: 252 10*3/uL (ref 150–400)
RBC: 3.91 MIL/uL (ref 3.87–5.11)
RDW: 15.9 % — AB (ref 11.5–15.5)
WBC: 9.3 10*3/uL (ref 4.0–10.5)

## 2014-11-18 LAB — GLUCOSE, CAPILLARY
GLUCOSE-CAPILLARY: 206 mg/dL — AB (ref 65–99)
GLUCOSE-CAPILLARY: 255 mg/dL — AB (ref 65–99)
GLUCOSE-CAPILLARY: 295 mg/dL — AB (ref 65–99)
GLUCOSE-CAPILLARY: 315 mg/dL — AB (ref 65–99)

## 2014-11-18 MED ORDER — POTASSIUM CHLORIDE CRYS ER 20 MEQ PO TBCR
40.0000 meq | EXTENDED_RELEASE_TABLET | Freq: Once | ORAL | Status: AC
  Administered 2014-11-18: 40 meq via ORAL
  Filled 2014-11-18: qty 2

## 2014-11-18 MED ORDER — VITAMIN B-12 1000 MCG PO TABS
500.0000 ug | ORAL_TABLET | Freq: Every day | ORAL | Status: DC
  Administered 2014-11-18 – 2014-11-20 (×3): 500 ug via ORAL
  Filled 2014-11-18 (×3): qty 0.5

## 2014-11-18 MED ORDER — CLINDAMYCIN HCL 300 MG PO CAPS
300.0000 mg | ORAL_CAPSULE | Freq: Three times a day (TID) | ORAL | Status: DC
  Administered 2014-11-18 – 2014-11-20 (×7): 300 mg via ORAL
  Filled 2014-11-18 (×9): qty 1

## 2014-11-18 MED ORDER — INSULIN GLARGINE 100 UNIT/ML ~~LOC~~ SOLN
25.0000 [IU] | Freq: Every day | SUBCUTANEOUS | Status: DC
  Administered 2014-11-18: 25 [IU] via SUBCUTANEOUS
  Filled 2014-11-18 (×2): qty 0.25

## 2014-11-18 MED ORDER — INSULIN ASPART 100 UNIT/ML ~~LOC~~ SOLN
6.0000 [IU] | Freq: Three times a day (TID) | SUBCUTANEOUS | Status: DC
  Administered 2014-11-19 – 2014-11-20 (×5): 6 [IU] via SUBCUTANEOUS

## 2014-11-18 NOTE — Progress Notes (Signed)
PROGRESS NOTE  LASHAWNTA Myers YWV:[id number] DOB: 02-19-1946 DOA: 11/14/2014 PCP: Gwendolyn Grant, MD  HPI/Recap of past 51 hours: 69 year old female past oral history of diastolic heart failure, diabetes mellitus and obstructive sleep apnea on chronic oxygen who had a recent admission for a UTI sepsis with pansensitive Escherichia coli discharged on 9/4 readmitted on 9/21 after 2 days of fever, weakness and brought in after she started developing confusion. On admission, patient found to be in septic shock which after initial workup felt to be secondary to cellulitis. Patient after several days of fluids, IV antibiotics and resuscitation improved and she was transferred to the floor with hospitalist taking over as attending on 9/26.  By 9/23, blood cultures positive for Streptococcus agalactiae.    Patient continues to improve. Breathing better today. White count now normal. Still complains of lower extremity pain, chronic in nature  Assessment/Plan: Active Problems:   Morbid obesity: Patient meets criteria with BMI greater than 40    Essential hypertension: Patient restarted back on all of her diuretic medications. We'll taper down IV fluids.   Chronic diastolic heart failure: Watching daily weights. Patient is actually down 4-6 pounds from admission. Continue diuretic's, taper off IV fluids.  Weight at big drop from previous day.? Accuracy   Sleep apnea: On CPAP   Stasis dermatitis  Toxic metabolic encephalopathy: Secondary to sepsis. Resolved, patient now appropriate    Severe sepsis from Streptococcus secondary to lower extremity cellulitis: Patient meets criteria given leukocytosis, lactic acidosis, tachycardia with cellulitis as source. Resolved. Continue antibiotics and have narrowed down to Rocephin.  By 9/28, will have completed 7 days of antibiotics and will not need any on discharge    AKI (acute kidney injury): Secondary to shock. Treat with IV fluids, resolved   Uncontrolled type 2 diabetes mellitus with peripheral neuropathy: Appreciate help from diabetic educator. On Lantus plus sliding scale. A1c on 9/1 at 8.7. Continue Neurontin, adjusted for age as per pharmacy. Titrate up Lantus and added scheduled NovoLog    Code Status: Full code  Family Communication: Left message with family  Disposition Plan: Improving, anticipate discharge tomorrow or Thursday. Awaiting improvement in CBGs, stable labs, awaiting physical therapy evaluation   Consultants:  Pulmonary  Procedures:  None  Antibiotics:  IV Zosyn 9/21-9/26  IV vancomycin 9/21-9/26  IV Rocephin 9/26-present   Objective: BP 150/54 mmHg  Pulse 62  Temp(Src) 97.8 F (36.6 C) (Oral)  Resp 20  Ht 4\' 11"  (1.499 m)  Wt 121.3 kg (267 lb 6.7 oz)  BMI 53.98 kg/m2  SpO2 97%  Intake/Output Summary (Last 24 hours) at 11/18/14 1951 Last data filed at 11/18/14 1700  Gross per 24 hour  Intake 1423.33 ml  Output   4100 ml  Net -2676.67 ml   Filed Weights   11/16/14 1255 11/17/14 0500 11/18/14 0534  Weight: 129.3 kg (285 lb 0.9 oz) 127.9 kg (281 lb 15.5 oz) 121.3 kg (267 lb 6.7 oz)    Exam:   General:  Alert and oriented 3, fatigued, no acute distress  Cardiovascular: Regular rate and rhythm, S1-S2  Respiratory: Decreased breath sounds throughout secondary to body habitus, otherwise clear  Abdomen: Soft, obese, nontender, positive bowel sounds  Musculoskeletal: 1+ pitting edema from the knees down bilaterally, some erythema noted on left as well as chronic venous stasis bilaterally  Data Reviewed: Basic Metabolic Panel:  Recent Labs Lab 11/14/14 2203 11/14/14 2207 11/15/14 0407 11/16/14 0400 11/17/14 0405 11/18/14 0925  NA 142 139 143 142 141 138  K 4.0 3.9 3.1* 3.3* 3.6 3.2*  CL 93* 94* 96* 102 99* 86*  CO2 35*  --  37* 31 34* 42*  GLUCOSE 172* 174* 173* 201* 248* 298*  BUN 27* 29* 29* 26* 18 22*  CREATININE 1.27* 1.20* 1.38* 1.40* 1.06* 1.17*  CALCIUM  10.0  --  8.6* 7.9* 8.6* 9.2  MG  --   --   --  1.7  --   --   PHOS  --   --   --  3.0  --   --    Liver Function Tests:  Recent Labs Lab 11/14/14 2203  AST 41  ALT 24  ALKPHOS 67  BILITOT 0.5  PROT 8.0  ALBUMIN 3.9   No results for input(s): LIPASE, AMYLASE in the last 168 hours. No results for input(s): AMMONIA in the last 168 hours. CBC:  Recent Labs Lab 11/14/14 2203 11/14/14 2207 11/15/14 0407 11/16/14 0400 11/17/14 0405 11/18/14 0925  WBC 14.8*  --  24.9* 17.8* 12.5* 9.3  NEUTROABS 12.5*  --   --   --   --   --   HGB 12.4 14.3 10.9* 9.4* 9.3* 10.2*  HCT 39.8 42.0 35.5* 30.1* 30.2* 33.2*  MCV 86.3  --  86.6 85.8 86.3 84.9  PLT 312  --  311 214 229 252   Cardiac Enzymes:    Recent Labs Lab 11/14/14 2333  CKTOTAL 110   BNP (last 3 results) No results for input(s): BNP in the last 8760 hours.  ProBNP (last 3 results)  Recent Labs  12/05/13 1700  PROBNP 52.4    CBG:  Recent Labs Lab 11/17/14 1709 11/17/14 2252 11/18/14 0720 11/18/14 1150 11/18/14 1709  GLUCAP 253* 364* 206* 295* 315*    Recent Results (from the past 240 hour(s))  Culture, blood (routine x 2)     Status: None   Collection Time: 11/14/14 10:03 PM  Result Value Ref Range Status   Specimen Description BLOOD RIGHT HAND  Final   Special Requests IN PEDIATRIC BOTTLE 3CC  Final   Culture  Setup Time   Final    GRAM POSITIVE COCCI IN CHAINS AEROBIC BOTTLE ONLY CRITICAL RESULT CALLED TO, READ BACK BY AND VERIFIED WITH: Doree Barthel 11/15/14 @ 0929 M VESTAL    Culture   Final    STREPTOCOCCUS AGALACTIAE Performed at [facility]    Report Status 11/17/2014 FINAL  Final   Organism ID, Bacteria STREPTOCOCCUS AGALACTIAE  Final      Susceptibility   Streptococcus agalactiae - MIC*    CLINDAMYCIN <=0.25 SENSITIVE Sensitive     AMPICILLIN <=0.25 SENSITIVE Sensitive     ERYTHROMYCIN 2 RESISTANT Resistant     VANCOMYCIN 0.5 SENSITIVE Sensitive     CEFTRIAXONE <=0.12 SENSITIVE  Sensitive     LEVOFLOXACIN 0.5 SENSITIVE Sensitive     * STREPTOCOCCUS AGALACTIAE  Urine culture     Status: None   Collection Time: 11/14/14 10:38 PM  Result Value Ref Range Status   Specimen Description URINE, CATHETERIZED  Final   Special Requests NONE  Final   Culture   Final    NO GROWTH 1 DAY Performed at [facility]    Report Status 11/16/2014 FINAL  Final  MRSA PCR Screening     Status: None   Collection Time: 11/15/14  5:48 AM  Result Value Ref Range Status   MRSA by PCR NEGATIVE NEGATIVE Final    Comment:        The  GeneXpert MRSA Assay (FDA approved for NASAL specimens only), is one component of a comprehensive MRSA colonization surveillance program. It is not intended to diagnose MRSA infection nor to guide or monitor treatment for MRSA infections.   C difficile quick scan w PCR reflex     Status: Abnormal   Collection Time: 11/16/14 12:30 PM  Result Value Ref Range Status   C Diff antigen POSITIVE (A) NEGATIVE Final   C Diff toxin NEGATIVE NEGATIVE Final   C Diff interpretation   Final    C. difficile present, but toxin not detected. This indicates colonization. In most cases, this does not require treatment. If patient has signs and symptoms consistent with colitis, consider treatment.     Studies: No results found.  Scheduled Meds: . acetaminophen  1,000 mg Oral Once  . clindamycin  300 mg Oral 3 times per day  . gabapentin  400 mg Oral TID  . heparin  5,000 Units Subcutaneous 3 times per day  . insulin aspart  0-20 Units Subcutaneous TID WC  . insulin aspart  0-5 Units Subcutaneous QHS  . [START ON 11/19/2014] insulin aspart  6 Units Subcutaneous TID WC  . insulin glargine  25 Units Subcutaneous QHS  . rOPINIRole  2 mg Oral BID  . sertraline  25 mg Oral Daily  . vitamin B-12  500 mcg Oral Daily    Continuous Infusions: . sodium chloride 10 mL/hr at 11/18/14 1301     Time spent: 15 minutes  [facility] Pager [phone]. If 7PM-7AM, please contact night-coverage at [email or web address], password [facility] 11/18/2014, 7:51 PM  LOS: 3 days

## 2014-11-18 NOTE — Progress Notes (Signed)
Inpatient Diabetes Program Recommendations  AACE/ADA: New Consensus Statement on Inpatient Glycemic Control (2015)  Target Ranges:  Prepandial:   less than 140 mg/dL      Peak postprandial:   less than 180 mg/dL (1-2 hours)      Critically ill patients:  140 - 180 mg/dL    Results for Melissa Myers, Melissa Myers (MRN [id number]) as of 11/18/2014 13:33  Ref. Range 11/18/2014 07:20 11/18/2014 11:50  Glucose-Capillary Latest Ref Range: 65-99 mg/dL 206 (H) 295 (H)    Home DM Meds: Regular insulin- 70 units tid with meals  Current DM Orders: Novolog Resistant SSI (0-20 units) TID AC + HS            Lantus 20 units QHS     -Note patient saw her Endocrinologist (Dr. Renato Shin with [facility]) on 11/12/14. At that visit, patient was instructed to stop taking her NPH insulin and to increase her Regular insulin to 70 units tid with meals.  -Glucose levels sustained >200 mg/dl here in the hospital setting.  -Note Lantus 20 units QHS started last PM.    MD- Please consider the following:  1. Increase to Lantus 25 units QHS  2. Start Novolog Meal Coverage- Novolog 6 units tid with meals     ----Will follow patient during hospitalization----  [name] RN, MSN, CDE Diabetes Coordinator Inpatient Glycemic Control Team Team Pager: [phone] (8a-5p)

## 2014-11-19 LAB — GLUCOSE, CAPILLARY
GLUCOSE-CAPILLARY: 266 mg/dL — AB (ref 65–99)
GLUCOSE-CAPILLARY: 210 mg/dL — AB (ref 65–99)
Glucose-Capillary: 349 mg/dL — ABNORMAL HIGH (ref 65–99)

## 2014-11-19 LAB — BASIC METABOLIC PANEL
ANION GAP: 13 (ref 5–15)
BUN: 33 mg/dL — ABNORMAL HIGH (ref 6–20)
CHLORIDE: 82 mmol/L — AB (ref 101–111)
CO2: 43 mmol/L — ABNORMAL HIGH (ref 22–32)
CREATININE: 1.06 mg/dL — AB (ref 0.44–1.00)
Calcium: 9.7 mg/dL (ref 8.9–10.3)
GFR calc non Af Amer: 52 mL/min — ABNORMAL LOW (ref >60)
Glucose, Bld: 247 mg/dL — ABNORMAL HIGH (ref 65–99)
POTASSIUM: 3.3 mmol/L — AB (ref 3.5–5.1)
SODIUM: 138 mmol/L (ref 135–145)

## 2014-11-19 MED ORDER — FENTANYL CITRATE (PF) 100 MCG/2ML IJ SOLN
50.0000 ug | INTRAMUSCULAR | Status: DC | PRN
  Administered 2014-11-20 (×2): 50 ug via INTRAVENOUS
  Filled 2014-11-19 (×2): qty 2

## 2014-11-19 MED ORDER — HYDROXYZINE HCL 25 MG PO TABS
25.0000 mg | ORAL_TABLET | Freq: Four times a day (QID) | ORAL | Status: DC | PRN
  Administered 2014-11-19: 25 mg via ORAL
  Filled 2014-11-19: qty 1

## 2014-11-19 MED ORDER — INSULIN GLARGINE 100 UNIT/ML ~~LOC~~ SOLN
30.0000 [IU] | Freq: Every day | SUBCUTANEOUS | Status: DC
  Administered 2014-11-19: 30 [IU] via SUBCUTANEOUS
  Filled 2014-11-19: qty 0.3

## 2014-11-19 MED ORDER — FENTANYL CITRATE (PF) 100 MCG/2ML IJ SOLN
50.0000 ug | Freq: Once | INTRAMUSCULAR | Status: AC
  Administered 2014-11-19: 50 ug via INTRAVENOUS
  Filled 2014-11-19: qty 2

## 2014-11-19 MED ORDER — HYDRALAZINE HCL 25 MG PO TABS
25.0000 mg | ORAL_TABLET | Freq: Three times a day (TID) | ORAL | Status: DC
  Administered 2014-11-19 – 2014-11-20 (×3): 25 mg via ORAL
  Filled 2014-11-19 (×3): qty 1

## 2014-11-19 MED ORDER — POTASSIUM CHLORIDE CRYS ER 20 MEQ PO TBCR
40.0000 meq | EXTENDED_RELEASE_TABLET | Freq: Once | ORAL | Status: AC
  Administered 2014-11-19: 40 meq via ORAL
  Filled 2014-11-19: qty 2

## 2014-11-19 MED ORDER — SPIRONOLACTONE 25 MG PO TABS
25.0000 mg | ORAL_TABLET | Freq: Every day | ORAL | Status: DC
  Administered 2014-11-19 – 2014-11-20 (×2): 25 mg via ORAL
  Filled 2014-11-19 (×2): qty 1

## 2014-11-19 NOTE — Evaluation (Signed)
Physical Therapy Evaluation Patient Details Name: Melissa Myers MRN: [id number] DOB: 03/08/1945 Today's Date: 11/19/2014   History of Present Illness  Melissa Myers is a 69 y.o. female has a past medical history significant for DM, diastolic CHF, morbid obesity, HTN, HLD presents to the ED 11/14/14  with fever, chief complaint of a UTI and confusion per family.  Clinical Impression  Patient  Mobilized to recliner, increased pain in legs with mobility. Reports feeling dizzy upon sitting which resolved.patient will benefit from PT to address problems listed in the note below.    Follow Up Recommendations  HHPT, 24/7    Equipment Recommendations    none   Recommendations for Other Services   OT    Precautions / Restrictions Precautions Precautions: Fall Precaution Comments: monitor O2      Mobility  Bed Mobility   Bed Mobility: Supine to Sit           General bed mobility comments: HOB up 30*, used rail  Transfers Overall transfer level: Needs assistance Equipment used: Rolling walker (2 wheeled) Transfers: Sit to/from Stand Sit to Stand: Min assist Stand pivot transfers: Min assist       General transfer comment: Close guard for safety. VC for hand and walker placement. Requires extra time , slow to  stand , able to  complete this task with physical assist. Slow turns, no buckling noted.  Ambulation/Gait             General Gait Details: nt  Stairs            Wheelchair Mobility    Modified Rankin (Stroke Patients Only)       Balance           Standing balance support: Single extremity supported Standing balance-Leahy Scale: Fair                               Pertinent Vitals/Pain Pain Assessment: Faces Pain Score: 10-Worst pain ever Pain Location: L knee and  lower leg Pain Descriptors / Indicators: Tender;Discomfort;Grimacing;Aching Pain Intervention(s): Limited activity within patient's tolerance;Monitored  during session    [place] expects to be discharged to:: Private residence Living Arrangements: Spouse/significant other Available Help at Discharge: Family;Available 24 hours/day Type of Home: House   Entrance Stairs-Rails: None Entrance Stairs-Number of Steps: 1 Home Layout: One level Home Equipment: Bedside commode;Shower seat - built in;Wheelchair Insurance claims handler - 4 wheels      Prior Function Level of Independence: Needs assistance   Gait / Transfers Assistance Needed: Independent with rollator  ADL's / Homemaking Assistance Needed: States husband assists with bath/dress        Hand Dominance        Extremity/Trunk Assessment                   LLE Deficits / Details: Decreased strength and ROM, history of OA per patient. Limited assessment due to pain     Communication   Communication: No difficulties  Cognition Arousal/Alertness: Awake/alert Behavior During Therapy: WFL for tasks assessed/performed Overall Cognitive Status: Within Functional Limits for tasks assessed                      General Comments General comments (skin integrity, edema, etc.): able to stand and balance for self pericare    Exercises        Assessment/Plan 3 x week   PT Assessment Patient  needs continued PT services  PT Diagnosis Difficulty walking;Generalized weakness;Acute pain   PT Problem List Decreased strength;Decreased range of motion;Decreased activity tolerance;Decreased balance;Decreased mobility;Decreased knowledge of use of DME;Cardiopulmonary status limiting activity;Obesity;Pain  PT Treatment Interventions     PT Goals (Current goals can be found in the Care Plan section) Acute Rehab PT Goals Patient Stated Goal: Go home PT Goal Formulation: With patient Time For Goal Achievement: 12/03/14 Potential to Achieve Goals: Good    Frequency     Barriers to discharge   patient reports that spouse is able to assist. Patient's elderly  mother is a patient in hospital, with whom lives with the patient.    Co-evaluation               End of Session   Activity Tolerance: Patient limited by pain Patient left: in chair;with call bell/phone within reach Nurse Communication: Mobility status         Time: 3545-6256 PT Time Calculation (min) (ACUTE ONLY): 37 min   Charges:   PT Evaluation $Initial PT Evaluation Tier I: 1 Procedure PT Treatments $Therapeutic Activity: 8-22 mins   PT G Codes:        Melissa Myers 11/19/2014, 2:28 PM [place] [facility] [phone]'

## 2014-11-19 NOTE — Progress Notes (Signed)
Pt refuses CPAP for the night. RT will continue to monitor.  

## 2014-11-19 NOTE — Progress Notes (Signed)
PROGRESS NOTE  Melissa Myers OHY:[id number] DOB: 01/20/1946 DOA: 11/14/2014 PCP: Gwendolyn Grant, MD  HPI/Recap of past 37 hours: 69 year old female past oral history of diastolic heart failure, diabetes mellitus and obstructive sleep apnea on chronic oxygen who had a recent admission for a UTI sepsis with pansensitive Escherichia coli discharged on 9/4 readmitted on 9/21 after 2 days of fever, weakness and brought in after she started developing confusion. On admission, patient found to be in septic shock which after initial workup felt to be secondary to cellulitis. Patient after several days of fluids, IV antibiotics and resuscitation improved and she was transferred to the floor with hospitalist taking over as attending on 9/26.  By 9/23, blood cultures positive for Streptococcus agalactiae.     Assessment/Plan: Active Problems:  Severe sepsis from Streptococcus secondary to lower extremity cellulitis:  -Patient meets criteria given leukocytosis, lactic acidosis, tachycardia with cellulitis  -Continue Clindamycin 300 mg PO q 8 hours   Morbid obesity -Patient meets criteria with BMI greater than 40  Essential hypertension:  -Will restart Hydralazine 10 mg TID  Chronic diastolic heart failure:  -Last transthoracic echocardiogram performed on 12/06/2013 that showed an EF of 65% -Will restart Aldactone 25 mg by mouth daily  Hypokalemia -Lab showing potassium of 3.3 -Will replace with Kdur 40 meq PO x 1  Toxic metabolic encephalopathy:  -Secondary to sepsis. Resolved, patient now appropriate  AKI (acute kidney injury):  -Secondary to shock. Treat with IV fluids, resolved  Uncontrolled type 2 diabetes mellitus with peripheral neuropathy:  -Blood sugars remain in the 200 range, will increase her Lantus from 25-30 units subcutaneous daily at bedtime, continue NovoLog 6 units subcutaneous 3 times a day with meals. She is on gabapentin 400 mg by mouth 3 times a day for diabetic  neuropathy.   Code Status: Full code  Family Communication: Left message with family  Disposition Plan: Improving, anticipate discharge tomorrow or Thursday. Awaiting improvement in CBGs, stable labs, awaiting physical therapy evaluation   Consultants:  Pulmonary  Procedures:  None  Antibiotics:  IV Zosyn 9/21-9/26  IV vancomycin 9/21-9/26  IV Rocephin 9/26-present   Objective: BP 140/54 mmHg  Pulse 58  Temp(Src) 97.9 F (36.6 C) (Oral)  Resp 20  Ht 4\' 11"  (1.499 m)  Wt 119 kg (262 lb 5.6 oz)  BMI 52.96 kg/m2  SpO2 97%  Intake/Output Summary (Last 24 hours) at 11/19/14 1429 Last data filed at 11/19/14 0935  Gross per 24 hour  Intake 1284.4 ml  Output   2400 ml  Net -1115.6 ml   Filed Weights   11/17/14 0500 11/18/14 0534 11/19/14 0534  Weight: 127.9 kg (281 lb 15.5 oz) 121.3 kg (267 lb 6.7 oz) 119 kg (262 lb 5.6 oz)    Exam:   General:  Alert and oriented 3, fatigued, no acute distress  Cardiovascular: Regular rate and rhythm, S1-S2  Respiratory: Decreased breath sounds throughout secondary to body habitus, otherwise clear  Abdomen: Soft, obese, nontender, positive bowel sounds  Musculoskeletal: 1+ pitting edema from the knees down bilaterally, some erythema noted on left as well as chronic venous stasis bilaterally  Data Reviewed: Basic Metabolic Panel:  Recent Labs Lab 11/15/14 0407 11/16/14 0400 11/17/14 0405 11/18/14 0925 11/19/14 0440  NA 143 142 141 138 138  K 3.1* 3.3* 3.6 3.2* 3.3*  CL 96* 102 99* 86* 82*  CO2 37* 31 34* 42* 43*  GLUCOSE 173* 201* 248* 298* 247*  BUN 29* 26* 18 22* 33*  CREATININE 1.38*  1.40* 1.06* 1.17* 1.06*  CALCIUM 8.6* 7.9* 8.6* 9.2 9.7  MG  --  1.7  --   --   --   PHOS  --  3.0  --   --   --    Liver Function Tests:  Recent Labs Lab 11/14/14 2203  AST 41  ALT 24  ALKPHOS 67  BILITOT 0.5  PROT 8.0  ALBUMIN 3.9   No results for input(s): LIPASE, AMYLASE in the last 168 hours. No results for  input(s): AMMONIA in the last 168 hours. CBC:  Recent Labs Lab 11/14/14 2203 11/14/14 2207 11/15/14 0407 11/16/14 0400 11/17/14 0405 11/18/14 0925  WBC 14.8*  --  24.9* 17.8* 12.5* 9.3  NEUTROABS 12.5*  --   --   --   --   --   HGB 12.4 14.3 10.9* 9.4* 9.3* 10.2*  HCT 39.8 42.0 35.5* 30.1* 30.2* 33.2*  MCV 86.3  --  86.6 85.8 86.3 84.9  PLT 312  --  311 214 229 252   Cardiac Enzymes:    Recent Labs Lab 11/14/14 2333  CKTOTAL 110   BNP (last 3 results) No results for input(s): BNP in the last 8760 hours.  ProBNP (last 3 results)  Recent Labs  12/05/13 1700  PROBNP 52.4    CBG:  Recent Labs Lab 11/18/14 1150 11/18/14 1709 11/18/14 2347 11/19/14 0816 11/19/14 1138  GLUCAP 295* 315* 255* 266* 210*    Recent Results (from the past 240 hour(s))  Culture, blood (routine x 2)     Status: None   Collection Time: 11/14/14 10:03 PM  Result Value Ref Range Status   Specimen Description BLOOD RIGHT HAND  Final   Special Requests IN PEDIATRIC BOTTLE 3CC  Final   Culture  Setup Time   Final    GRAM POSITIVE COCCI IN CHAINS AEROBIC BOTTLE ONLY CRITICAL RESULT CALLED TO, READ BACK BY AND VERIFIED WITH: Doree Barthel 11/15/14 @ 0929 M VESTAL    Culture   Final    STREPTOCOCCUS AGALACTIAE Performed at [facility]    Report Status 11/17/2014 FINAL  Final   Organism ID, Bacteria STREPTOCOCCUS AGALACTIAE  Final      Susceptibility   Streptococcus agalactiae - MIC*    CLINDAMYCIN <=0.25 SENSITIVE Sensitive     AMPICILLIN <=0.25 SENSITIVE Sensitive     ERYTHROMYCIN 2 RESISTANT Resistant     VANCOMYCIN 0.5 SENSITIVE Sensitive     CEFTRIAXONE <=0.12 SENSITIVE Sensitive     LEVOFLOXACIN 0.5 SENSITIVE Sensitive     * STREPTOCOCCUS AGALACTIAE  Urine culture     Status: None   Collection Time: 11/14/14 10:38 PM  Result Value Ref Range Status   Specimen Description URINE, CATHETERIZED  Final   Special Requests NONE  Final   Culture   Final    NO GROWTH 1  DAY Performed at [facility]    Report Status 11/16/2014 FINAL  Final  MRSA PCR Screening     Status: None   Collection Time: 11/15/14  5:48 AM  Result Value Ref Range Status   MRSA by PCR NEGATIVE NEGATIVE Final    Comment:        The GeneXpert MRSA Assay (FDA approved for NASAL specimens only), is one component of a comprehensive MRSA colonization surveillance program. It is not intended to diagnose MRSA infection nor to guide or monitor treatment for MRSA infections.   C difficile quick scan w PCR reflex     Status: Abnormal   Collection  Time: 11/16/14 12:30 PM  Result Value Ref Range Status   C Diff antigen POSITIVE (A) NEGATIVE Final   C Diff toxin NEGATIVE NEGATIVE Final   C Diff interpretation   Final    C. difficile present, but toxin not detected. This indicates colonization. In most cases, this does not require treatment. If patient has signs and symptoms consistent with colitis, consider treatment.     Studies: No results found.  Scheduled Meds: . acetaminophen  1,000 mg Oral Once  . clindamycin  300 mg Oral 3 times per day  . gabapentin  400 mg Oral TID  . heparin  5,000 Units Subcutaneous 3 times per day  . insulin aspart  0-20 Units Subcutaneous TID WC  . insulin aspart  0-5 Units Subcutaneous QHS  . insulin aspart  6 Units Subcutaneous TID WC  . insulin glargine  25 Units Subcutaneous QHS  . rOPINIRole  2 mg Oral BID  . sertraline  25 mg Oral Daily  . vitamin B-12  500 mcg Oral Daily    Continuous Infusions: . sodium chloride 10 mL/hr at 11/18/14 1301     Time spent: 15 minutes  Kelvin Cellar  [facility] Pager [phone]. If 7PM-7AM, please contact night-coverage at [email or web address], password [facility] 11/19/2014, 2:29 PM  LOS: 4 days

## 2014-11-19 NOTE — Care Management Note (Signed)
Case Management Note  Patient Details  Name: JOWANA THUMMA MRN: [id number] Date of Birth: March 05, 1945  Subjective/Objective:                 69 yo admitted with Sepsis due to strep   Action/Plan: Pt is from home with spouse. She is currently active with [facility] for HHPT/OT/RN and has home 02. She will need resumption orders at discharge to continue [facility] services at discharge.  Expected Discharge Date:                  Expected Discharge Plan:  [place]  In-House Referral:     Discharge planning Services  CM Consult  Post Acute Care Choice:    Choice offered to:     DME Arranged:    DME Agency:     HH Arranged:  RN, PT, OT [place] Agency:  [place]  Status of Service:  In process, will continue to follow  Medicare Important Message Given:  Yes-second notification given Date Medicare IM Given:    Medicare IM give by:    Date Additional Medicare IM Given:    Additional Medicare Important Message give by:     If discussed at [place] of Stay Meetings, dates discussed:    Additional Comments: Chart reviewed and CM following for DC needs. Lynnell Catalan, RN 11/19/2014, 2:32 PM

## 2014-11-20 LAB — BASIC METABOLIC PANEL
Anion gap: 12 (ref 5–15)
BUN: 38 mg/dL — AB (ref 6–20)
CALCIUM: 9.7 mg/dL (ref 8.9–10.3)
CO2: 38 mmol/L — ABNORMAL HIGH (ref 22–32)
CREATININE: 0.93 mg/dL (ref 0.44–1.00)
Chloride: 89 mmol/L — ABNORMAL LOW (ref 101–111)
GFR calc Af Amer: >60 mL/min (ref >60)
GFR calc non Af Amer: >60 mL/min (ref >60)
GLUCOSE: 213 mg/dL — AB (ref 65–99)
POTASSIUM: 3.5 mmol/L (ref 3.5–5.1)
SODIUM: 139 mmol/L (ref 135–145)

## 2014-11-20 LAB — GLUCOSE, CAPILLARY
GLUCOSE-CAPILLARY: 273 mg/dL — AB (ref 65–99)
Glucose-Capillary: 294 mg/dL — ABNORMAL HIGH (ref 65–99)
Glucose-Capillary: 208 mg/dL — ABNORMAL HIGH (ref 65–99)

## 2014-11-20 MED ORDER — POTASSIUM CHLORIDE CRYS ER 20 MEQ PO TBCR: 40.0000 meq | EXTENDED_RELEASE_TABLET | Freq: Every day | ORAL | Status: DC

## 2014-11-20 MED ORDER — CLINDAMYCIN HCL 300 MG PO CAPS: 300.0000 mg | ORAL_CAPSULE | Freq: Three times a day (TID) | ORAL | Status: DC

## 2014-11-20 NOTE — Progress Notes (Signed)
AVS and discharge instructions discussed with patient and spouse; understanding verbalized.  Patient will follow-up with PCP next week.  Patient discharged to home with spouse.

## 2014-11-25 DIAGNOSIS — I129 Hypertensive chronic kidney disease with stage 1 through stage 4 chronic kidney disease, or unspecified chronic kidney disease: Secondary | ICD-10-CM | POA: Diagnosis not present

## 2014-11-25 DIAGNOSIS — N183 Chronic kidney disease, stage 3 (moderate): Secondary | ICD-10-CM | POA: Diagnosis not present

## 2014-11-25 DIAGNOSIS — I5032 Chronic diastolic (congestive) heart failure: Secondary | ICD-10-CM | POA: Diagnosis not present

## 2014-11-25 DIAGNOSIS — E785 Hyperlipidemia, unspecified: Secondary | ICD-10-CM | POA: Diagnosis not present

## 2014-11-25 DIAGNOSIS — E1122 Type 2 diabetes mellitus with diabetic chronic kidney disease: Secondary | ICD-10-CM | POA: Diagnosis not present

## 2014-11-25 DIAGNOSIS — N390 Urinary tract infection, site not specified: Secondary | ICD-10-CM | POA: Diagnosis not present

## 2014-11-27 DIAGNOSIS — N390 Urinary tract infection, site not specified: Secondary | ICD-10-CM | POA: Diagnosis not present

## 2014-11-27 DIAGNOSIS — N183 Chronic kidney disease, stage 3 (moderate): Secondary | ICD-10-CM | POA: Diagnosis not present

## 2014-11-27 DIAGNOSIS — I129 Hypertensive chronic kidney disease with stage 1 through stage 4 chronic kidney disease, or unspecified chronic kidney disease: Secondary | ICD-10-CM | POA: Diagnosis not present

## 2014-11-27 DIAGNOSIS — E785 Hyperlipidemia, unspecified: Secondary | ICD-10-CM | POA: Diagnosis not present

## 2014-11-27 DIAGNOSIS — I5032 Chronic diastolic (congestive) heart failure: Secondary | ICD-10-CM | POA: Diagnosis not present

## 2014-11-27 DIAGNOSIS — E1122 Type 2 diabetes mellitus with diabetic chronic kidney disease: Secondary | ICD-10-CM | POA: Diagnosis not present

## 2014-12-01 ENCOUNTER — Ambulatory Visit (INDEPENDENT_AMBULATORY_CARE_PROVIDER_SITE_OTHER): Payer: Medicare Other | Admitting: Internal Medicine

## 2014-12-01 ENCOUNTER — Encounter: Payer: Self-pay | Admitting: Internal Medicine

## 2014-12-01 VITALS — BP 130/50 | HR 62 | Temp 97.8°F

## 2014-12-01 DIAGNOSIS — L03116 Cellulitis of left lower limb: Secondary | ICD-10-CM

## 2014-12-01 DIAGNOSIS — A419 Sepsis, unspecified organism: Secondary | ICD-10-CM

## 2014-12-01 NOTE — Patient Instructions (Signed)
You were here for follow up from your recent hospitalization.  Your infection has been successfully treated.  You should monitor your legs/feet for increased swelling, pain, redness and warm, which may indicate another infection. If you have an discharge from your legs you need to come in for an appointment.  Elevate your legs whenever you sit.  Continue your current medications.  No changes recommended.

## 2014-12-01 NOTE — Progress Notes (Signed)
Pre visit review using our clinic review tool, if applicable. No additional management support is needed unless otherwise documented below in the visit note. 

## 2014-12-01 NOTE — Progress Notes (Signed)
Subjective:    Patient ID: Melissa Myers, female    DOB: 11-29-1945, 69 y.o.   MRN: [id number]  HPI She is here for hospital follow up.  She has a history of diastolic CHF, diabetes, OSA on home oxygen and sepsis.  She was in the hospital the end of August for sepsis related to a UTI.  She went to the ED 11/14/14 for fever and AMS.  She did not feel well for two days prior to being evaluated.  That day she had a fever and altered mental status.  She had a fever of 105 in the ED, elevated lactate and anion gap. She was started on broad spectrum antibiotics.  Her right leg was swollen, red, warm and tender.  A CT scan of the leg was done to rule out abscess and necrotizing fasciitis, which was negative.  She was diagnosed with sepsis related to left leg cellulitis.  She had AKI which improved with fluids.    She was discharged home on the 9/29.  She was sent home on clindamycin, which she completed.  She feels weak and fatigued.  She denies fever.  Her appetite has been decreased since the hospital and varies.  Her sugars have been running less than 200.  She did have an episode of hypoglycemia of 77.  Usually her sugar is 150-170.  She denies any open wounds in her leg.  She has chronic pain in the left leg that has been present for months.  She takes gabapentin for peripheral neuropathy.  She has chronic edema. Her legs are less swollen than usual, but are more swollen than when she was in the hosptial.  She tries to elevate her legs, but he recliner does not go high enough.  She often sits with her legs down. She tries to be active.  She is taking her diuretics daily, but she does not feel that they work.  Medications and allergies reviewed with patient and updated if appropriate.  Patient Active Problem List   Diagnosis Date Noted  . AKI (acute kidney injury) ([place]) 11/17/2014  . Uncontrolled type 2 diabetes mellitus with peripheral neuropathy ([place]) 11/17/2014  . Cellulitis of left lower  extremity 11/17/2014  . Toxic metabolic encephalopathy 74/[id number]/4967  . Diabetes ([place]) 11/15/2014  . Sepsis due to Streptococcus agalactiae ([place]) 10/23/2014  . Open wound of leg 09/23/2014  . Depression 01/16/2013  . Anemia 07/26/2012  . Bilateral leg pain 12/23/2011  . Stasis dermatitis 01/04/2011  . INSOMNIA 08/03/2009  . Chronic diastolic heart failure ([place]) 06/01/2009  . VITAMIN D DEFICIENCY 05/12/2009  . Sleep apnea 04/29/2009  . Dyslipidemia 04/20/2009  . Morbid obesity ([place]) 04/20/2009  . RESTLESS LEGS SYNDROME 04/20/2009  . PERIPHERAL NEUROPATHY 04/20/2009  . Essential hypertension 04/20/2009  . ARTHRITIS 04/20/2009  . BACK PAIN, LUMBAR, CHRONIC 04/20/2009    Past Medical History  Diagnosis Date  . Type II or unspecified type diabetes mellitus without mention of complication, not stated as uncontrolled     insulin dep  . Hyperlipidemia     hx rhabdo on statins  . Hypertension   . Diastolic CHF ([place])   . Chronic diarrhea     a/w nausea - felt related to IBS  . RLS (restless legs syndrome)   . Osteoarthritis   . Stasis dermatitis   . GERD (gastroesophageal reflux disease)   . On home oxygen therapy     uses oxygen 2 liters min per [place] at night and prn during  day  . Anemia   . Neuropathy ([facility])     feet, toes and fingers  . Disc degeneration, lumbar   . OSA (obstructive sleep apnea)     05/2009 sleep study - refuses CPAP  . Deaf     left side only  . Shortness of breath     chronic    Past Surgical History  Procedure Laterality Date  . Tubal ligation  1980  . Cholecystectomy  1997  . Uterine polyp removal  2008  . Umbilical hernia repair  1995  . Tonsillectomy  1970  . Colonoscopy N/A 12/03/2012    Procedure: COLONOSCOPY;  Surgeon: Lafayette Dragon, MD;  Location: [facility];  Service: Endoscopy;  Laterality: N/A;    Social History   Social History  . Marital Status: Married    Spouse Name: N/A  . Number of Children: N/A  . Years of Education: N/A    Social History Main Topics  . Smoking status: Never Smoker   . Smokeless tobacco: Never Used  . Alcohol Use: No  . Drug Use: No  . Sexual Activity: No   Other Topics Concern  . None   Social History Narrative   Lives with spouse and mother. Retired Network engineer, now housewife    Review of Systems  Constitutional: Positive for appetite change (slightly decreased) and fatigue. Negative for fever and chills.  Respiratory: Negative for cough, shortness of breath and wheezing.   Cardiovascular: Positive for leg swelling. Negative for chest pain.  Gastrointestinal: Negative for abdominal pain and diarrhea.  Genitourinary: Negative for dysuria.  Neurological: Negative for light-headedness and headaches.       Objective:   Filed Vitals:   12/01/14 1617  BP: 130/50  Pulse: 62  Temp: 97.8 F (36.6 C)   Filed Weights   There is no weight on file to calculate BMI.   Physical Exam  Constitutional: She appears well-developed and well-nourished. No distress.  HENT:  Head: Normocephalic and atraumatic.  Eyes: Conjunctivae are normal.  Neck: Neck supple. No tracheal deviation present. No thyromegaly present.  Cardiovascular: Normal rate and regular rhythm.   Murmur (2/6 systolic murmur) heard. Pulmonary/Chest: Effort normal and breath sounds normal. No respiratory distress. She has no wheezes.  Abdominal: Soft. There is no tenderness.  Musculoskeletal: She exhibits edema (chronic edema with chronic skin changes LE b/l, no open wounds.  dry skin feet and legs b/l) and tenderness (b/l lower leg tenderness with palpation - chronic).  Lymphadenopathy:    She has no cervical adenopathy.  Skin: Skin is warm and dry. No rash noted. There is erythema (chronic erythema in le from chronic edema).          Assessment & Plan:   Hospital follow up - sepsis related to left leg cellulitis w/o abscess Her infection has resolved with the antibiotics and her legs are at their baseline as  far as swelling, pain, erythema Stressed the importance of elevating her legs when she is able Low sodium diet Continue current medications No need for blood work today  Discussed monitoring her legs daily - she is at an increased risk of infection in the future.

## 2014-12-02 ENCOUNTER — Other Ambulatory Visit: Payer: Self-pay

## 2014-12-02 DIAGNOSIS — E785 Hyperlipidemia, unspecified: Secondary | ICD-10-CM | POA: Diagnosis not present

## 2014-12-02 DIAGNOSIS — I129 Hypertensive chronic kidney disease with stage 1 through stage 4 chronic kidney disease, or unspecified chronic kidney disease: Secondary | ICD-10-CM | POA: Diagnosis not present

## 2014-12-02 DIAGNOSIS — E1122 Type 2 diabetes mellitus with diabetic chronic kidney disease: Secondary | ICD-10-CM | POA: Diagnosis not present

## 2014-12-02 DIAGNOSIS — I5032 Chronic diastolic (congestive) heart failure: Secondary | ICD-10-CM | POA: Diagnosis not present

## 2014-12-02 DIAGNOSIS — N390 Urinary tract infection, site not specified: Secondary | ICD-10-CM | POA: Diagnosis not present

## 2014-12-02 DIAGNOSIS — N183 Chronic kidney disease, stage 3 (moderate): Secondary | ICD-10-CM | POA: Diagnosis not present

## 2014-12-02 NOTE — Patient Outreach (Signed)
Home visit scheduled for October 20

## 2014-12-03 DIAGNOSIS — N183 Chronic kidney disease, stage 3 (moderate): Secondary | ICD-10-CM | POA: Diagnosis not present

## 2014-12-03 DIAGNOSIS — I5032 Chronic diastolic (congestive) heart failure: Secondary | ICD-10-CM | POA: Diagnosis not present

## 2014-12-03 DIAGNOSIS — E785 Hyperlipidemia, unspecified: Secondary | ICD-10-CM | POA: Diagnosis not present

## 2014-12-03 DIAGNOSIS — N390 Urinary tract infection, site not specified: Secondary | ICD-10-CM | POA: Diagnosis not present

## 2014-12-03 DIAGNOSIS — E1122 Type 2 diabetes mellitus with diabetic chronic kidney disease: Secondary | ICD-10-CM | POA: Diagnosis not present

## 2014-12-03 DIAGNOSIS — I129 Hypertensive chronic kidney disease with stage 1 through stage 4 chronic kidney disease, or unspecified chronic kidney disease: Secondary | ICD-10-CM | POA: Diagnosis not present

## 2014-12-04 DIAGNOSIS — E785 Hyperlipidemia, unspecified: Secondary | ICD-10-CM | POA: Diagnosis not present

## 2014-12-04 DIAGNOSIS — N183 Chronic kidney disease, stage 3 (moderate): Secondary | ICD-10-CM | POA: Diagnosis not present

## 2014-12-04 DIAGNOSIS — I129 Hypertensive chronic kidney disease with stage 1 through stage 4 chronic kidney disease, or unspecified chronic kidney disease: Secondary | ICD-10-CM | POA: Diagnosis not present

## 2014-12-04 DIAGNOSIS — N390 Urinary tract infection, site not specified: Secondary | ICD-10-CM | POA: Diagnosis not present

## 2014-12-04 DIAGNOSIS — E1122 Type 2 diabetes mellitus with diabetic chronic kidney disease: Secondary | ICD-10-CM | POA: Diagnosis not present

## 2014-12-04 DIAGNOSIS — I5032 Chronic diastolic (congestive) heart failure: Secondary | ICD-10-CM | POA: Diagnosis not present

## 2014-12-05 DIAGNOSIS — E785 Hyperlipidemia, unspecified: Secondary | ICD-10-CM | POA: Diagnosis not present

## 2014-12-05 DIAGNOSIS — N183 Chronic kidney disease, stage 3 (moderate): Secondary | ICD-10-CM | POA: Diagnosis not present

## 2014-12-05 DIAGNOSIS — I5032 Chronic diastolic (congestive) heart failure: Secondary | ICD-10-CM | POA: Diagnosis not present

## 2014-12-05 DIAGNOSIS — E1122 Type 2 diabetes mellitus with diabetic chronic kidney disease: Secondary | ICD-10-CM | POA: Diagnosis not present

## 2014-12-05 DIAGNOSIS — I129 Hypertensive chronic kidney disease with stage 1 through stage 4 chronic kidney disease, or unspecified chronic kidney disease: Secondary | ICD-10-CM | POA: Diagnosis not present

## 2014-12-05 DIAGNOSIS — N390 Urinary tract infection, site not specified: Secondary | ICD-10-CM | POA: Diagnosis not present

## 2014-12-08 DIAGNOSIS — E1122 Type 2 diabetes mellitus with diabetic chronic kidney disease: Secondary | ICD-10-CM | POA: Diagnosis not present

## 2014-12-08 DIAGNOSIS — E785 Hyperlipidemia, unspecified: Secondary | ICD-10-CM | POA: Diagnosis not present

## 2014-12-08 DIAGNOSIS — I129 Hypertensive chronic kidney disease with stage 1 through stage 4 chronic kidney disease, or unspecified chronic kidney disease: Secondary | ICD-10-CM | POA: Diagnosis not present

## 2014-12-08 DIAGNOSIS — N183 Chronic kidney disease, stage 3 (moderate): Secondary | ICD-10-CM | POA: Diagnosis not present

## 2014-12-08 DIAGNOSIS — N390 Urinary tract infection, site not specified: Secondary | ICD-10-CM | POA: Diagnosis not present

## 2014-12-08 DIAGNOSIS — I5032 Chronic diastolic (congestive) heart failure: Secondary | ICD-10-CM | POA: Diagnosis not present

## 2014-12-09 DIAGNOSIS — I129 Hypertensive chronic kidney disease with stage 1 through stage 4 chronic kidney disease, or unspecified chronic kidney disease: Secondary | ICD-10-CM | POA: Diagnosis not present

## 2014-12-09 DIAGNOSIS — E1122 Type 2 diabetes mellitus with diabetic chronic kidney disease: Secondary | ICD-10-CM | POA: Diagnosis not present

## 2014-12-09 DIAGNOSIS — E785 Hyperlipidemia, unspecified: Secondary | ICD-10-CM | POA: Diagnosis not present

## 2014-12-09 DIAGNOSIS — I5032 Chronic diastolic (congestive) heart failure: Secondary | ICD-10-CM | POA: Diagnosis not present

## 2014-12-09 DIAGNOSIS — N390 Urinary tract infection, site not specified: Secondary | ICD-10-CM | POA: Diagnosis not present

## 2014-12-09 DIAGNOSIS — N183 Chronic kidney disease, stage 3 (moderate): Secondary | ICD-10-CM | POA: Diagnosis not present

## 2014-12-11 ENCOUNTER — Other Ambulatory Visit: Payer: Self-pay

## 2014-12-11 DIAGNOSIS — I129 Hypertensive chronic kidney disease with stage 1 through stage 4 chronic kidney disease, or unspecified chronic kidney disease: Secondary | ICD-10-CM | POA: Diagnosis not present

## 2014-12-11 DIAGNOSIS — I5032 Chronic diastolic (congestive) heart failure: Secondary | ICD-10-CM | POA: Diagnosis not present

## 2014-12-11 DIAGNOSIS — E1122 Type 2 diabetes mellitus with diabetic chronic kidney disease: Secondary | ICD-10-CM | POA: Diagnosis not present

## 2014-12-11 DIAGNOSIS — N390 Urinary tract infection, site not specified: Secondary | ICD-10-CM | POA: Diagnosis not present

## 2014-12-11 DIAGNOSIS — E785 Hyperlipidemia, unspecified: Secondary | ICD-10-CM | POA: Diagnosis not present

## 2014-12-11 DIAGNOSIS — N183 Chronic kidney disease, stage 3 (moderate): Secondary | ICD-10-CM | POA: Diagnosis not present

## 2014-12-11 NOTE — Discharge Summary (Signed)
Physician Discharge Summary  Melissa Myers YIR:[id number] DOB: February 25, 1945 DOA: 11/14/2014  PCP: Melissa Grant, MD  Admit date: 11/14/2014 Discharge date: 11/20/2014  Time spent: 35 minutes  Recommendations for Outpatient Follow-up:  1. Follow up on cellulitis 2. Follow up on BMP and CBC   Discharge Diagnoses:  Principal Problem:   Sepsis due to Streptococcus agalactiae (Melissa Myers) Active Problems:   Morbid obesity (Melissa Myers)   Essential hypertension   Chronic diastolic heart failure (Melissa Myers)   Sleep apnea   Stasis dermatitis   AKI (acute kidney injury) (Melissa Myers)   Uncontrolled type 2 diabetes mellitus with peripheral neuropathy (Melissa Myers)   Cellulitis of left lower extremity   Toxic metabolic encephalopathy   Discharge Condition: Stable  Diet recommendation: Heart Healthy  Filed Weights   11/18/14 0534 11/19/14 0534 11/19/14 2045  Weight: 121.3 kg (267 lb 6.7 oz) 119 kg (262 lb 5.6 oz) 119.795 kg (264 lb 1.6 oz)    History of present illness:  Ms. Melissa Myers is a 69 y/o woman with past medical history significant for diastolic CHF, DM2, OHS/OSA on home O2 who was recently admitted with sepsis secondary to a UTI (E. Coli - pan sensitive). She was treated with antibiotics and discharged home. Over the last 2 days she has been feeling poorly. The evening of presentation she developed a fever and altered mental status and her husband called EMS. In the ER she was found to have a fever to 105F and a lactate of 5.66 with an anion gap of 14. She was started on broad spectrum antibiotics and PCCM was called for admission due to her elevated lactate.   A foley had been placed prior to my arrival and was draining clear light colored urine. UA was was remarkable for spec grav of 1.007 and no WBCs, negative for nitrites or leuk esterase. On my exam her left leg is red, swollen and significantly warmer than her right leg. There are peau d'orange changes on the posterior thigh above her knee. There  are excoriations on her shin and upper thigh. She has venous stasis changes on both lower extremities. I discussed imaging choice with the radiologist on call and they recommended a CT with contrast to evaluate for deep tissue abscess as well as for gas in the fascial planes.  Hospital Course:  69 year old female past oral history of diastolic heart failure, diabetes mellitus and obstructive sleep apnea on chronic oxygen who had a recent admission for a UTI sepsis with pansensitive Escherichia coli discharged on 9/4 readmitted on 9/21 after 2 days of fever, weakness and brought in after she started developing confusion. On admission, patient found to be in septic shock which after initial workup felt to be secondary to cellulitis. Patient after several days of fluids, IV antibiotics and resuscitation improved and she was transferred to the floor with hospitalist taking over as attending on 9/26. By 9/23, blood cultures positive for Streptococcus agalactiae.   Severe sepsis from Streptococcus secondary to lower extremity cellulitis:  -Patient meets criteria given leukocytosis, lactic acidosis, tachycardia with cellulitis  -She was discharged on Clindamycin 300 mg PO q 8 hours  Morbid obesity -Patient meets criteria with BMI greater than 40  Essential hypertension:  -She was restarted on restarted on  Hydralazine 10 mg TID and coreg 25 mg po BID  Chronic diastolic heart failure:  -Last transthoracic echocardiogram performed on 12/06/2013 that showed an EF of 65% -Discharged on Aldactone 25 mg by mouth daily  Hypokalemia -Lab showing potassium of 3.3 -Please  follow up on BMP on hospital follow up visit  Toxic metabolic encephalopathy:  -Secondary to sepsis. Resolved, patient now appropriate  AKI (acute kidney injury):  -Secondary to shock. Treat with IV fluids, resolved  Uncontrolled type 2 diabetes mellitus with peripheral neuropathy:  -Blood sugars remain in the 200 range,  will increase her Lantus from 25-30 units subcutaneous daily at bedtime, continue NovoLog 6 units subcutaneous 3 times a day with meals. She is on gabapentin 400 mg by mouth 3 times a day for diabetic neuropathy.   Discharge Exam: Filed Vitals:   11/19/14 2045  BP: 168/69  Pulse: 87  Temp: 98.4 F (36.9 C)  Resp: 20    General: Alert and oriented 3, fatigued, no acute distress  Cardiovascular: Regular rate and rhythm, S1-S2  Respiratory: Decreased breath sounds throughout secondary to body habitus, otherwise clear  Abdomen: Soft, obese, nontender, positive bowel sounds  Musculoskeletal: 1+ pitting edema from the knees down bilaterally, some erythema noted on left as well as chronic venous stasis bilaterally  Discharge Instructions   Discharge Instructions    Call MD for:  difficulty breathing, headache or visual disturbances    Complete by:  As directed      Call MD for:  extreme fatigue    Complete by:  As directed      Call MD for:  hives    Complete by:  As directed      Call MD for:  persistant dizziness or light-headedness    Complete by:  As directed      Call MD for:  persistant nausea and vomiting    Complete by:  As directed      Call MD for:  redness, tenderness, or signs of infection (pain, swelling, redness, odor or green/yellow discharge around incision site)    Complete by:  As directed      Call MD for:  severe uncontrolled pain    Complete by:  As directed      Call MD for:  temperature >100.4    Complete by:  As directed      Call MD for:    Complete by:  As directed      Diet - low sodium heart healthy    Complete by:  As directed      Increase activity slowly    Complete by:  As directed           Discharge Medication List as of 11/20/2014  1:08 PM    START taking these medications   Details  clindamycin (CLEOCIN) 300 MG capsule Take 1 capsule (300 mg total) by mouth every 8 (eight) hours., Starting 11/20/2014, Until Discontinued, Print       CONTINUE these medications which have CHANGED   Details  potassium chloride SA (K-DUR,KLOR-CON) 20 MEQ tablet Take 2 tablets (40 mEq total) by mouth daily., Starting 11/20/2014, Until Discontinued, Print      CONTINUE these medications which have NOT CHANGED   Details  aspirin EC 81 MG tablet Take 1 tablet (81 mg total) by mouth daily., Starting 07/08/2014, Until Discontinued, OTC    B Complex-C (B-COMPLEX WITH VITAMIN C) tablet Take 1 tablet by mouth daily., Until Discontinued, Historical Med    carvedilol (COREG) 25 MG tablet TAKE 1 TABLET BY MOUTH TWICE DAILY WITH FOOD, Normal    Cholecalciferol (VITAMIN D3) 2000 UNITS capsule Take 2,000 Units by mouth 4 (four) times daily - after meals and at bedtime. , Until Discontinued, Historical Med  diclofenac sodium (VOLTAREN) 1 % GEL Apply 2 g topically daily as needed (knee pain)., Starting 10/26/2014, Until Discontinued, Normal    ezetimibe (ZETIA) 10 MG tablet Take 1 tablet (10 mg total) by mouth daily., Starting 07/08/2014, Until Discontinued, Normal    fenofibrate 160 MG tablet TAKE 1 TABLET BY MOUTH DAILY, Normal    gabapentin (NEURONTIN) 300 MG capsule Take 1 capsule (300 mg total) by mouth 4 (four) times daily., Starting 07/08/2014, Until Discontinued, Normal    Glucosamine HCl 1000 MG TABS Take 1,000 mg by mouth 2 (two) times daily., Until Discontinued, Historical Med    hydrALAZINE (APRESOLINE) 25 MG tablet TAKE 1 TABLET (25 MG TOTAL) BY MOUTH 3 (THREE) TIMES DAILY., Normal    hydrOXYzine (ATARAX/VISTARIL) 10 MG tablet Take 10 mg by mouth 3 (three) times daily as needed for anxiety., Until Discontinued, Historical Med    insulin regular (HUMULIN R) 100 units/mL injection Inject 0.7 mLs (70 Units total) into the skin 3 (three) times daily before meals., Starting 11/12/2014, Until Discontinued, Normal    ketoconazole (NIZORAL) 2 % cream Apply 1 application topically daily., Starting 11/05/2014, Until Discontinued, Print     lactobacillus (FLORANEX/LACTINEX) PACK Take 1 packet (1 g total) by mouth 3 (three) times daily with meals., Starting 10/26/2014, Until Discontinued, Normal    Magnesium 500 MG TABS Take 500 mg by mouth daily., Until Discontinued, Historical Med    omega-3 acid ethyl esters (LOVAZA) 1 G capsule Take 1 g by mouth 2 (two) times daily. , Until Discontinued, Historical Med    oxycodone (OXY-IR) 5 MG capsule Take 5 mg by mouth every 6 (six) hours., Until Discontinued, Historical Med    rOPINIRole (REQUIP) 2 MG tablet Take 2 mg by mouth 2 (two) times daily., Until Discontinued, Historical Med    sertraline (ZOLOFT) 25 MG tablet Take 1 tablet (25 mg total) by mouth daily., Starting 10/26/2014, Until Discontinued, Normal    spironolactone (ALDACTONE) 25 MG tablet Take 1 tablet (25 mg total) by mouth daily., Starting 07/08/2014, Until Discontinued, Normal    torsemide (DEMADEX) 100 MG tablet Take 1 tablet (100 mg total) by mouth 2 (two) times daily., Starting 10/10/2014, Until Discontinued, Normal    triamcinolone cream (KENALOG) 0.1 % Apply 1 application topically 2 (two) times daily., Starting 10/09/2014, Until Discontinued, Normal    vitamin B-12 (CYANOCOBALAMIN) 500 MCG tablet Take 500 mcg by mouth daily., Until Discontinued, Historical Med    senna-docusate (SENOKOT-S) 8.6-50 MG per tablet Take 2 tablets by mouth 2 (two) times daily., Starting 10/26/2014, Until Discontinued, Normal    metolazone (ZAROXOLYN) 2.5 MG tablet Take 1 tablet (2.5 mg total) by mouth every other day., Starting 07/24/2014, Until Discontinued, Normal       Allergies  Allergen Reactions  . Cymbalta [Duloxetine Hcl] Other (See Comments)    Restless leg syndrome  . Statins Other (See Comments)    Statin drugs cause muscle pain / "muscle damage"--was told by MD not to take  . Sulfa Antibiotics Diarrhea  . Levemir [Insulin Detemir] Itching  . Morphine Other (See Comments)    GI upset and headaches  . Zinc Swelling and Rash    Follow-up Information    Follow up with Melissa Grant, MD In 1 week.   Specialty:  Internal Medicine   Contact information:   [address] [place] [address] [phone]        The results of significant diagnostics from this hospitalization (including imaging, microbiology,  ancillary and laboratory) are listed below for reference.    Significant Diagnostic Studies: Dg Chest Port 1 View  11/16/2014  CLINICAL DATA:  Acute respiratory failure.  Shortness of breath. EXAM: PORTABLE CHEST 1 VIEW COMPARISON:  Earlier this day at 0441 hour FINDINGS: Cardiomegaly is unchanged. Persistent but decreased pulmonary edema. No confluent airspace disease, pleural effusion or pneumothorax. Old right rib fractures. IMPRESSION: Improving pulmonary edema. Unchanged cardiomegaly. No new abnormality. Electronically Signed   By: Jeb Levering M.D.   On: 11/16/2014 21:20   Dg Chest Port 1 View  11/16/2014  CLINICAL DATA:  Acute respiratory failure.  CHF. EXAM: PORTABLE CHEST 1 VIEW COMPARISON:  11/14/2014 FINDINGS: Cardiomegaly with vascular congestion. Improving interstitial edema pattern with mild interstitial edema. No confluent opacities or effusions. No acute bony abnormality. IMPRESSION: Cardiomegaly.  Improving interstitial edema. Electronically Signed   By: Rolm Baptise M.D.   On: 11/16/2014 07:30   Dg Chest Portable 1 View  11/14/2014  CLINICAL DATA:  Sepsis.  Fever. EXAM: PORTABLE CHEST 1 VIEW COMPARISON:  Chest radiograph 10/22/2014 FINDINGS: Monitoring leads overlie the patient. Stable enlarged cardiac and mediastinal contours. Pulmonary vascular redistribution and mild interstitial opacities bilaterally. No pleural effusion or pneumothorax. IMPRESSION: Cardiomegaly. Pulmonary vascular redistribution and mild interstitial pulmonary edema. Electronically Signed   By: Lovey Newcomer M.D.   On: 11/14/2014 22:37   Ct Extrem Lower W Cm Bil  11/15/2014  CLINICAL DATA:   Diffuse left leg redness and swelling, most pronounced in the posterior aspect of the distal thigh. Clinical concern for necrotizing fasciitis. EXAM: CT OF THE LOWER BILATERAL EXTREMITY WITH CONTRAST TECHNIQUE: Multidetector CT imaging of the was performed according to the standard protocol following intravenous contrast administration. COMPARISON:  None. CONTRAST:  123mL OMNIPAQUE IOHEXOL 300 MG/ML  SOLN FINDINGS: Mild bilateral subcutaneous edema. This is symmetrical. More pronounced diffuse subcutaneous edema in the ankles and feet. No fluid collections or soft tissue gas. No bone destruction or periosteal reaction. Bilateral knee degenerative changes, greater on the left. Foley catheter in the urinary bladder. IMPRESSION: Mild bilateral subcutaneous edema without abscess, evidence of osteomyelitis or evidence of necrotizing fasciitis. Electronically Signed   By: Claudie Revering M.D.   On: 11/15/2014 00:38    Microbiology: No results found for this or any previous visit (from the past 240 hour(s)).   Labs: Basic Metabolic Panel: No results for input(s): NA, K, CL, CO2, GLUCOSE, BUN, CREATININE, CALCIUM, MG, PHOS in the last 168 hours. Liver Function Tests: No results for input(s): AST, ALT, ALKPHOS, BILITOT, PROT, ALBUMIN in the last 168 hours. No results for input(s): LIPASE, AMYLASE in the last 168 hours. No results for input(s): AMMONIA in the last 168 hours. CBC: No results for input(s): WBC, NEUTROABS, HGB, HCT, MCV, PLT in the last 168 hours. Cardiac Enzymes: No results for input(s): CKTOTAL, CKMB, CKMBINDEX, TROPONINI in the last 168 hours. BNP: BNP (last 3 results) No results for input(s): BNP in the last 8760 hours.  ProBNP (last 3 results) No results for input(s): PROBNP in the last 8760 hours.  CBG: No results for input(s): GLUCAP in the last 168 hours.     SignedKelvin Cellar  [facility] 12/11/2014, 8:07 AM

## 2014-12-11 NOTE — Patient Outreach (Signed)
[facility]) Care Management  12/11/2014  Melissa Myers [birth year] [id number]   Discharge visit, reviewed Plan of Care and completion of goals. Reviewed [facility] contact information, if necessary for self referral and community care coordination needs.

## 2014-12-12 ENCOUNTER — Other Ambulatory Visit (HOSPITAL_COMMUNITY): Payer: Self-pay | Admitting: Cardiology

## 2014-12-16 DIAGNOSIS — I5032 Chronic diastolic (congestive) heart failure: Secondary | ICD-10-CM | POA: Diagnosis not present

## 2014-12-16 DIAGNOSIS — N183 Chronic kidney disease, stage 3 (moderate): Secondary | ICD-10-CM | POA: Diagnosis not present

## 2014-12-16 DIAGNOSIS — E785 Hyperlipidemia, unspecified: Secondary | ICD-10-CM | POA: Diagnosis not present

## 2014-12-16 DIAGNOSIS — N390 Urinary tract infection, site not specified: Secondary | ICD-10-CM | POA: Diagnosis not present

## 2014-12-16 DIAGNOSIS — I129 Hypertensive chronic kidney disease with stage 1 through stage 4 chronic kidney disease, or unspecified chronic kidney disease: Secondary | ICD-10-CM | POA: Diagnosis not present

## 2014-12-16 DIAGNOSIS — E1122 Type 2 diabetes mellitus with diabetic chronic kidney disease: Secondary | ICD-10-CM | POA: Diagnosis not present

## 2014-12-18 DIAGNOSIS — I5032 Chronic diastolic (congestive) heart failure: Secondary | ICD-10-CM | POA: Diagnosis not present

## 2014-12-18 DIAGNOSIS — I129 Hypertensive chronic kidney disease with stage 1 through stage 4 chronic kidney disease, or unspecified chronic kidney disease: Secondary | ICD-10-CM | POA: Diagnosis not present

## 2014-12-18 DIAGNOSIS — N390 Urinary tract infection, site not specified: Secondary | ICD-10-CM | POA: Diagnosis not present

## 2014-12-18 DIAGNOSIS — N183 Chronic kidney disease, stage 3 (moderate): Secondary | ICD-10-CM | POA: Diagnosis not present

## 2014-12-18 DIAGNOSIS — E1122 Type 2 diabetes mellitus with diabetic chronic kidney disease: Secondary | ICD-10-CM | POA: Diagnosis not present

## 2014-12-18 DIAGNOSIS — E785 Hyperlipidemia, unspecified: Secondary | ICD-10-CM | POA: Diagnosis not present

## 2014-12-18 NOTE — Patient Outreach (Signed)
[facility]) Care Management  12/18/2014  Melissa Myers 30-Mar-1945 [id number]   Notification from Erenest Rasher, RN to close case due to goals met with [place] Management.  Thanks, [name]. [place], [place] Assistant Phone: [phone] Fax: [phone]

## 2014-12-23 ENCOUNTER — Ambulatory Visit (HOSPITAL_COMMUNITY)
Admission: RE | Admit: 2014-12-23 | Discharge: 2014-12-23 | Disposition: A | Discharge status: Home / Self Care | Payer: Medicare Other | Source: Ambulatory Visit | Attending: Cardiology | Admitting: Cardiology

## 2014-12-23 VITALS — BP 138/66 | HR 61 | Wt 273.4 lb

## 2014-12-23 DIAGNOSIS — I5032 Chronic diastolic (congestive) heart failure: Secondary | ICD-10-CM | POA: Insufficient documentation

## 2014-12-23 DIAGNOSIS — M1990 Unspecified osteoarthritis, unspecified site: Secondary | ICD-10-CM | POA: Insufficient documentation

## 2014-12-23 DIAGNOSIS — Z7982 Long term (current) use of aspirin: Secondary | ICD-10-CM | POA: Insufficient documentation

## 2014-12-23 DIAGNOSIS — G2581 Restless legs syndrome: Secondary | ICD-10-CM | POA: Diagnosis not present

## 2014-12-23 DIAGNOSIS — E1142 Type 2 diabetes mellitus with diabetic polyneuropathy: Secondary | ICD-10-CM | POA: Insufficient documentation

## 2014-12-23 DIAGNOSIS — I110 Hypertensive heart disease with heart failure: Secondary | ICD-10-CM | POA: Diagnosis not present

## 2014-12-23 DIAGNOSIS — E669 Obesity, unspecified: Secondary | ICD-10-CM | POA: Diagnosis not present

## 2014-12-23 DIAGNOSIS — Z79899 Other long term (current) drug therapy: Secondary | ICD-10-CM | POA: Insufficient documentation

## 2014-12-23 DIAGNOSIS — Z8249 Family history of ischemic heart disease and other diseases of the circulatory system: Secondary | ICD-10-CM | POA: Insufficient documentation

## 2014-12-23 DIAGNOSIS — E785 Hyperlipidemia, unspecified: Secondary | ICD-10-CM | POA: Insufficient documentation

## 2014-12-23 DIAGNOSIS — G4733 Obstructive sleep apnea (adult) (pediatric): Secondary | ICD-10-CM | POA: Insufficient documentation

## 2014-12-23 DIAGNOSIS — Z9981 Dependence on supplemental oxygen: Secondary | ICD-10-CM | POA: Diagnosis not present

## 2014-12-23 DIAGNOSIS — Z794 Long term (current) use of insulin: Secondary | ICD-10-CM | POA: Insufficient documentation

## 2014-12-23 LAB — LIPID PANEL
CHOL/HDL RATIO: 4.4 ratio
CHOLESTEROL: 196 mg/dL (ref 0–200)
HDL: 45 mg/dL (ref >40)
LDL Cholesterol: 110 mg/dL — ABNORMAL HIGH (ref 0–99)
TRIGLYCERIDES: 204 mg/dL — AB (ref <150)
VLDL: 41 mg/dL — ABNORMAL HIGH (ref 0–40)

## 2014-12-23 LAB — BASIC METABOLIC PANEL
ANION GAP: 13 (ref 5–15)
BUN: 35 mg/dL — ABNORMAL HIGH (ref 6–20)
CO2: 31 mmol/L (ref 22–32)
Calcium: 9 mg/dL (ref 8.9–10.3)
Chloride: 93 mmol/L — ABNORMAL LOW (ref 101–111)
Creatinine, Ser: 1.15 mg/dL — ABNORMAL HIGH (ref 0.44–1.00)
GFR calc Af Amer: 55 mL/min — ABNORMAL LOW (ref >60)
GFR, EST NON AFRICAN AMERICAN: 47 mL/min — AB (ref >60)
Glucose, Bld: 145 mg/dL — ABNORMAL HIGH (ref 65–99)
POTASSIUM: 4.1 mmol/L (ref 3.5–5.1)
SODIUM: 137 mmol/L (ref 135–145)

## 2014-12-23 MED ORDER — METOLAZONE 5 MG PO TABS: ORAL_TABLET | ORAL | Status: DC

## 2014-12-23 MED ORDER — POTASSIUM CHLORIDE CRYS ER 20 MEQ PO TBCR: 40.0000 meq | EXTENDED_RELEASE_TABLET | Freq: Every day | ORAL | Status: DC

## 2014-12-23 NOTE — Progress Notes (Signed)
Patient ID: Melissa Myers, female   DOB: [birth year], 69 y.o.   MRN: [id number] PCP: Dr. Asa Lente Endocrinologist: Dr Loanne Drilling  69 yo with history of HTN, DM, hyperlipidemia, OHS/OSA, and chronic dyspnea/diastolic CHF.  Patient had an echo in 04/2009 showing moderate LVH and preserved LV systolic function. However, there was a very large LV mid-cavity gradient with valsalva.  Patient developed quite significant exertional dyspnea to the point where she was short of breath walking around her house. She had a pulmonary evaluation with Dr. Elsworth Soho but no primary lung problems were identified. Patient had an ETT-myoview in 05/2009 which was negative for ischemia or infarction. Right heart cath in 05/2009 showed mildly elevated right heart filling pressures but normal PA pressure and normal PCWP. She was started her on a beta blocker (Coreg) to try to lower her LV mid-cavity gradient (that likely occurs with exertion) and to better control BP.  V/Q scan was negative for PE.  PFTs from 06/2011 showed a restrictive defect. Last echo in 11/2013 showed EF 65% with normal RV size and systolic function.      Admitted 12/05/13 with volume overload. Diuresed with IV lasix and transitioned to torsemide 80 mg twice a day + metolazone. Overall she diuresed 21 pounds. Discharge weight was 263 pounds.   She was admitted in 9/16 with Strep agalactiae sepsis likely from cellulitis.    She returns for folowup today. She has not been taking metolazone because she does not think that it helps.  She is taking torsemide.  Weight is up 2 lbs. She is short of breath walking outside the house for longer distances but no dyspnea in house.  She uses oxygen at all times.  Chronic orthopnea, no PND.  No chest pain.  No lightheadedness or syncope.   Labs (10/13): K 4.1, creatinine 0.85 Labs (4/14): K 3.7, creatinine 1.2, BUN 43, BNP 23 Labs (5/14): K 4.1, creatinine 1.0 Labs (7/14): K 4.6 Creatinine 1.0 BNP 39.0  Labs (8/14): K 3.5,  creatinine 0.91, BNP 71 Labs (11/14): K 3.7, creatinine 0.9 Labs (12/14): K 4.1, creatinine 0.8, BNP 52 Labs (3/15): K 4, creatinine 0.9 Labs (4/15): K 3.7, creatinine 0.9 Labs (5/15): K 3.8, creatinine 0.9, BNP 29 Labs (6/15): K 3.4, BUN 105, creatinine 0.9 =>1.7 Labs (6/17/5) K 4.2, BUN 41, Creatinine 0.80 Labs (08/19/13) K 3.2, BUN 71, creatinine 1.5 Labs 09/05/13 K 4.1 Creatinine 1.1 Labs 11/07/13 K 3.6 Creatinine 0.91 Labs 12/11/13 K 4.5 Creatine 1.05 Labs (12/15): K 3.8, creatinine 1.03 Labs (03/17/2014): K 4.1 Creatinine 0.99  Labs (4/16): K 3.7, creatinine 1.24 Labs (5/16): LDL 108, TGs 699 Labs (07/24/2014) K 3.5 Creatinine 1.17  Labs (07/31/2014) K 3.3 Creatinine 1.13 Labs (9/16): K 3.5, creatinine 0.93  Allergies (verified):  1) ! Sulfa  2) Morphine   Past Medical History:  1. Diabetes mellitus, type II  2. Hyperlipidemia: She has been unable to tolerate statins (has been on Vytorin, lovastatin, and Crestor) due to what sounds like rhabdo: developed muscle weakness and was told she had "muscle damage" with each of these statins. She has been told not to take statins anymore.  Has hypertriglyceridemia.  3. Hypertension 4. Obesity  5. Restless Leg Syndrome  6. OA  7. peripheral neuropathy  8. OHS/OSA: Uses supplemental oxygen, does not tolerate CPAP.   9. Chronic nausea/diarrhea: ? IBS  10. Diastolic CHF: Echo (5/40) with normal LV size, moderate LVH, EF 65-70%, LV mid-cavity gradient reaching 63 mmHg with valsalva but minimal at rest, grade  I diastolic dysfunction, cannot estimate PA systolic pressure (no TR doppler signal), RV normal. RHC (4/11): Mean RA 11, RV 37/13, PA 33/15, mean PCWP 12, CI 3.3. Echo in 11/12 showed mild LVH, EF 65-70%, no LV mid-cavity gradient was mentioned.  Echo in 4/14 showed EF 65-70%, mild LVH, grade I diastolic dysfunction, PA systolic pressure 35 mmHg, mild LV mid-cavity gradient.  Echo (5/15) with EF 60-65%, mild TR, normal RV size and systolic  function. Echo (10/15) with EF 65%, moderate LVH, normal RV.  11. ETT-myoview (4/11): 5'30", stopped due to fatigue, normal EF, no evidence for scar or ischemia. 12. V/Q scan 11/12: negative for PE.  Lower extremity venous doppler US (3/14): negative for DVT.  13. PFTs (5/13): FVC 50%, FEV1 62%, ratio 106%, DLCO 69%, TLC 69%.   Family History:  Family History of Alcoholism/Addiction (parent)  Family History Diabetes 1st degree relative (grandparent)  Family History High cholesterol (parent, grandparent)  Family History Hypertension (parent, grandparent)  Family History Lung cancer (grandparent)  Stomach cancer (grandmother)  Celiac Sprue daughter  Mother with MI at 59, father with MI at 29, uncle with MI at 55, brother with stents in his 20s, multiple aunts with MIs and CVAs   Social History:  Never Smoked  no alcohol  married, lives with spouse and her mother  retired Network engineer - now housewife  Alcohol Use - no  Illicit Drug Use - no  Review of Systems  All systems reviewed and negative except as per HPI.   ROS: All systems reviewed and negative except as per HPI.   Current Outpatient Prescriptions  Medication Sig Dispense Refill  . aspirin EC 81 MG tablet Take 1 tablet (81 mg total) by mouth daily.    . B Complex-C (B-COMPLEX WITH VITAMIN C) tablet Take 1 tablet by mouth daily.    . carvedilol (COREG) 25 MG tablet TAKE 1 TABLET BY MOUTH TWICE DAILY WITH FOOD 60 tablet 5  . Cholecalciferol (VITAMIN D3) 2000 UNITS capsule Take 2,000 Units by mouth 4 (four) times daily - after meals and at bedtime.     . diclofenac sodium (VOLTAREN) 1 % GEL Apply 2 g topically daily as needed (knee pain). 10 g 3  . ezetimibe (ZETIA) 10 MG tablet Take 1 tablet (10 mg total) by mouth daily. 90 tablet 3  . fenofibrate 160 MG tablet TAKE 1 TABLET BY MOUTH DAILY 30 tablet 4  . gabapentin (NEURONTIN) 300 MG capsule Take 600 mg by mouth 4 (four) times daily.    . Glucosamine HCl 1000 MG TABS Take 1,000  mg by mouth 2 (two) times daily.    . hydrALAZINE (APRESOLINE) 25 MG tablet TAKE 1 TABLET (25 MG TOTAL) BY MOUTH 3 (THREE) TIMES DAILY. 90 tablet 2  . hydrOXYzine (ATARAX/VISTARIL) 10 MG tablet Take 10 mg by mouth 3 (three) times daily as needed for anxiety.    . insulin regular (HUMULIN R) 100 units/mL injection Inject 0.7 mLs (70 Units total) into the skin 3 (three) times daily before meals. 70 mL 11  . ketoconazole (NIZORAL) 2 % cream Apply 1 application topically daily. 60 g 1  . lactobacillus (FLORANEX/LACTINEX) PACK Take 1 packet (1 g total) by mouth 3 (three) times daily with meals. 30 packet 0  . Magnesium 500 MG TABS Take 500 mg by mouth daily.    [name] omega-3 acid ethyl esters (LOVAZA) 1 G capsule Take 1 g by mouth 2 (two) times daily.     [name] oxycodone (OXY-IR) 5  MG capsule Take 5 mg by mouth every 6 (six) hours.    . potassium chloride SA (K-DUR,KLOR-CON) 20 MEQ tablet Take 2 tablets (40 mEq total) by mouth daily. Take an additional 55meq on Wednesay and Saturday with Metolazone. 90 tablet 3  . rOPINIRole (REQUIP) 2 MG tablet Take 2 mg by mouth 2 (two) times daily.    . sertraline (ZOLOFT) 25 MG tablet Take 1 tablet (25 mg total) by mouth daily. 30 tablet 0  . spironolactone (ALDACTONE) 25 MG tablet Take 1 tablet (25 mg total) by mouth daily. 90 tablet 3  . torsemide (DEMADEX) 100 MG tablet Take 1 tablet (100 mg total) by mouth 2 (two) times daily. 60 tablet 6  . triamcinolone cream (KENALOG) 0.1 % Apply 1 application topically 2 (two) times daily as needed (rash).    . vitamin B-12 (CYANOCOBALAMIN) 500 MCG tablet Take 500 mcg by mouth daily.    . metolazone (ZAROXOLYN) 5 MG tablet TAKE 5mg  (1 tablet) every Wednesday and Saturday 15 tablet 3   No current facility-administered medications for this encounter.   Filed Vitals:   12/23/14 1057  BP: 138/66  Pulse: 61  Weight: 273 lb 6.4 oz (124.013 kg)  SpO2: 91%    General: NAD, obese. Husband present.. Arrived in wheelchair.   Neck:  Thick, JVP 10-11.  No thyromegaly or thyroid nodule.  Lungs: Clear to auscultation bilaterally with normal respiratory effort. Chronic 2L oxygen CV: Nondisplaced PMI.  Heart regular S1/S2, no S3/S4, 2/6 SEM RUSB.    No carotid bruit.  Abdomen: Soft, nontender, no hepatosplenomegaly, obese Neurologic: Alert and oriented x 3.  Psych: Normal affect. Extremities: No clubbing/cyanosis, bilateral venous stasis.  1+ edema to knees bilaterally.    Assessment/Plan:  1. Chronic diastolic CHF:  She is volume overloaded on exam today.  NYHA class III.  Not taking metolazone.   - I will have her continue torsemide 100 mg bid.  - Start metolazone 5 mg twice a week 30 minutes before am torsemide on Wednesdays and Saturdays.  She will take an extra 20 mEq KCl on metolazone days.   - BMET/BNP today and in 2 wks.  2. OHS:  Followed by Dr Elsworth Soho. Per Dr Elsworth Soho no role for CPAP yet. Continue home oxygen (at all times). 3. Obesity: Difficult for her to exercise with knee pain.  Have encouraged her to be more active and watch her portion control.   4. HTN: Stable. Continue current regimen.   5. Hyperlipidemia: Very high triglycerides. Lovaza added and she will continue Zetia and Fenofibrate. Lipids today.     Follow up in 4 weeks    Loralie Champagne   10:47 PM

## 2014-12-23 NOTE — Patient Instructions (Signed)
Routine lab work today. (bmet bnp lipids) Will notify you of abnormal results  TAKE Metolazone 5mg  EVERY Wednesday and Saturday.  TAKE extra 2meq of Potassium.  REPEAT bmet in 2 weeks.  Follow up: 1 month NP clinic

## 2014-12-23 NOTE — Progress Notes (Signed)
Advanced Heart Failure Medication Review by a Pharmacist  Does the patient  feel that his/her medications are working for him/her?  yes  Has the patient been experiencing any side effects to the medications prescribed?  no  Does the patient measure his/her own blood pressure or blood glucose at home?  yes   Does the patient have any problems obtaining medications due to transportation or finances?   no  Understanding of regimen: good Understanding of indications: good Potential of compliance: good Patient understands to avoid NSAIDs. Patient understands to avoid decongestants.  Issues to address at subsequent visits: None   Pharmacist comments:  Melissa Myers is a pleasant 69 yo F presenting without a medication list but with good recall of her medications including dosages. She reports good compliance with her medications but admits to not using metolazone since she doesn't feel like it works for her. She did not have any other medication-related questions or concerns for me at this time.  [name]. Velva Harman, PharmD, BCPS, CPP Clinical Pharmacist Pager: [phone] Phone: [phone] 12/23/2014 11:07 AM      Time with patient: 4 minutes Preparation and documentation time: 2 minutes Total time: 6 minutes

## 2014-12-24 DIAGNOSIS — I5032 Chronic diastolic (congestive) heart failure: Secondary | ICD-10-CM | POA: Diagnosis not present

## 2014-12-24 DIAGNOSIS — I129 Hypertensive chronic kidney disease with stage 1 through stage 4 chronic kidney disease, or unspecified chronic kidney disease: Secondary | ICD-10-CM | POA: Diagnosis not present

## 2014-12-24 DIAGNOSIS — E1122 Type 2 diabetes mellitus with diabetic chronic kidney disease: Secondary | ICD-10-CM | POA: Diagnosis not present

## 2014-12-24 DIAGNOSIS — E785 Hyperlipidemia, unspecified: Secondary | ICD-10-CM | POA: Diagnosis not present

## 2014-12-24 DIAGNOSIS — N390 Urinary tract infection, site not specified: Secondary | ICD-10-CM | POA: Diagnosis not present

## 2014-12-24 DIAGNOSIS — N183 Chronic kidney disease, stage 3 (moderate): Secondary | ICD-10-CM | POA: Diagnosis not present

## 2014-12-30 ENCOUNTER — Other Ambulatory Visit: Payer: Self-pay | Admitting: Endocrinology

## 2015-01-06 DIAGNOSIS — G894 Chronic pain syndrome: Secondary | ICD-10-CM | POA: Diagnosis not present

## 2015-01-06 DIAGNOSIS — M79673 Pain in unspecified foot: Secondary | ICD-10-CM | POA: Diagnosis not present

## 2015-01-06 DIAGNOSIS — H25013 Cortical age-related cataract, bilateral: Secondary | ICD-10-CM | POA: Diagnosis not present

## 2015-01-06 DIAGNOSIS — I10 Essential (primary) hypertension: Secondary | ICD-10-CM | POA: Diagnosis not present

## 2015-01-06 DIAGNOSIS — E119 Type 2 diabetes mellitus without complications: Secondary | ICD-10-CM | POA: Diagnosis not present

## 2015-01-06 DIAGNOSIS — M25569 Pain in unspecified knee: Secondary | ICD-10-CM | POA: Diagnosis not present

## 2015-01-06 DIAGNOSIS — Z79899 Other long term (current) drug therapy: Secondary | ICD-10-CM | POA: Diagnosis not present

## 2015-01-06 DIAGNOSIS — M545 Low back pain: Secondary | ICD-10-CM | POA: Diagnosis not present

## 2015-01-07 ENCOUNTER — Ambulatory Visit (HOSPITAL_COMMUNITY)
Admission: RE | Admit: 2015-01-07 | Discharge: 2015-01-07 | Disposition: A | Discharge status: Home / Self Care | Payer: Medicare Other | Source: Ambulatory Visit | Attending: Cardiology | Admitting: Cardiology

## 2015-01-07 DIAGNOSIS — I5032 Chronic diastolic (congestive) heart failure: Secondary | ICD-10-CM | POA: Insufficient documentation

## 2015-01-07 LAB — BASIC METABOLIC PANEL
Anion gap: 8 (ref 5–15)
BUN: 27 mg/dL — AB (ref 6–20)
CHLORIDE: 96 mmol/L — AB (ref 101–111)
CO2: 36 mmol/L — ABNORMAL HIGH (ref 22–32)
Calcium: 10.1 mg/dL (ref 8.9–10.3)
Creatinine, Ser: 1.08 mg/dL — ABNORMAL HIGH (ref 0.44–1.00)
GFR, EST AFRICAN AMERICAN: 59 mL/min — AB (ref >60)
GFR, EST NON AFRICAN AMERICAN: 51 mL/min — AB (ref >60)
Glucose, Bld: 147 mg/dL — ABNORMAL HIGH (ref 65–99)
POTASSIUM: 4.1 mmol/L (ref 3.5–5.1)
SODIUM: 140 mmol/L (ref 135–145)

## 2015-01-08 ENCOUNTER — Ambulatory Visit: Payer: Medicare Other | Admitting: Internal Medicine

## 2015-01-12 ENCOUNTER — Ambulatory Visit (INDEPENDENT_AMBULATORY_CARE_PROVIDER_SITE_OTHER): Payer: Medicare Other | Admitting: Endocrinology

## 2015-01-12 ENCOUNTER — Encounter: Payer: Self-pay | Admitting: Endocrinology

## 2015-01-12 VITALS — BP 124/63 | HR 67 | Temp 98.1°F

## 2015-01-12 DIAGNOSIS — E1165 Type 2 diabetes mellitus with hyperglycemia: Secondary | ICD-10-CM

## 2015-01-12 DIAGNOSIS — E1142 Type 2 diabetes mellitus with diabetic polyneuropathy: Secondary | ICD-10-CM

## 2015-01-12 DIAGNOSIS — IMO0002 Reserved for concepts with insufficient information to code with codable children: Secondary | ICD-10-CM

## 2015-01-12 LAB — POCT GLYCOSYLATED HEMOGLOBIN (HGB A1C): HEMOGLOBIN A1C: 7.9

## 2015-01-12 MED ORDER — INSULIN REGULAR HUMAN 100 UNIT/ML IJ SOLN: INTRAMUSCULAR | Status: DC

## 2015-01-12 NOTE — Progress Notes (Signed)
Subjective:    Patient ID: Melissa Myers, female    DOB: [birth year], 69 y.o.   MRN: [id number]  HPI Pt returns for f/u of diabetes mellitus: DM type: Insulin-requiring type 2.   Dx'ed: [id number] Complications: sensory neuropathy of the lower extremities, renal insufficiency, and leg ulcers. Therapy: insulin since 2011 GDM: never DKA: never Severe hypoglycemia: never.   Pancreatitis: never Other: takes multiple daily injections; the pattern of cbg's indicated she did not need basal insulin; she is too ill to undergo weight loss surgery.   Interval history: since last ov here, she has been in the hospital twice.  She as too unsteady on her feet to be weighed today, but she says she weight 273, 1 week ago.  no cbg record, but states she has had 2 episodes of severe hypoglycemia since hosp d/c.  Both were in the early hrs of the AM.  Pt says she takes reg insulin only with meals.   Past Medical History  Diagnosis Date  . Type II or unspecified type diabetes mellitus without mention of complication, not stated as uncontrolled     insulin dep  . Hyperlipidemia     hx rhabdo on statins  . Hypertension   . Diastolic CHF ([place])   . Chronic diarrhea     a/w nausea - felt related to IBS  . RLS (restless legs syndrome)   . Osteoarthritis   . Stasis dermatitis   . GERD (gastroesophageal reflux disease)   . On home oxygen therapy     uses oxygen 2 liters min per [place] at night and prn during day  . Anemia   . Neuropathy ([facility])     feet, toes and fingers  . Disc degeneration, lumbar   . OSA (obstructive sleep apnea)     05/2009 sleep study - refuses CPAP  . Deaf     left side only  . Shortness of breath     chronic    Past Surgical History  Procedure Laterality Date  . Tubal ligation  1980  . Cholecystectomy  1997  . Uterine polyp removal  2008  . Umbilical hernia repair  1995  . Tonsillectomy  1970  . Colonoscopy N/A 12/03/2012    Procedure: COLONOSCOPY;  Surgeon: Lafayette Dragon,  MD;  Location: [facility];  Service: Endoscopy;  Laterality: N/A;    Social History   Social History  . Marital Status: Married    Spouse Name: N/A  . Number of Children: N/A  . Years of Education: N/A   Occupational History  . Not on file.   Social History Main Topics  . Smoking status: Never Smoker   . Smokeless tobacco: Never Used  . Alcohol Use: No  . Drug Use: No  . Sexual Activity: No   Other Topics Concern  . Not on file   Social History Narrative   Lives with spouse and mother. Retired Network engineer, now housewife    Current Outpatient Prescriptions on File Prior to Visit  Medication Sig Dispense Refill  . aspirin EC 81 MG tablet Take 1 tablet (81 mg total) by mouth daily.    . B Complex-C (B-COMPLEX WITH VITAMIN C) tablet Take 1 tablet by mouth daily.    . BD INSULIN SYRINGE ULTRAFINE 31G X 15/64" 1 ML MISC USE TO INJECT INSULIN 4 TIMES DAILY AS ADVISED. 100 each 4  . carvedilol (COREG) 25 MG tablet TAKE 1 TABLET BY MOUTH TWICE DAILY WITH FOOD 60 tablet 5  .  Cholecalciferol (VITAMIN D3) 2000 UNITS capsule Take 2,000 Units by mouth 4 (four) times daily - after meals and at bedtime.     . diclofenac sodium (VOLTAREN) 1 % GEL Apply 2 g topically daily as needed (knee pain). 10 g 3  . ezetimibe (ZETIA) 10 MG tablet Take 1 tablet (10 mg total) by mouth daily. 90 tablet 3  . fenofibrate 160 MG tablet TAKE 1 TABLET BY MOUTH DAILY 30 tablet 4  . gabapentin (NEURONTIN) 300 MG capsule Take 600 mg by mouth 4 (four) times daily.    . Glucosamine HCl 1000 MG TABS Take 1,000 mg by mouth 2 (two) times daily.    . hydrALAZINE (APRESOLINE) 25 MG tablet TAKE 1 TABLET (25 MG TOTAL) BY MOUTH 3 (THREE) TIMES DAILY. 90 tablet 2  . hydrOXYzine (ATARAX/VISTARIL) 10 MG tablet Take 10 mg by mouth 3 (three) times daily as needed for anxiety.    [name] ketoconazole (NIZORAL) 2 % cream Apply 1 application topically daily. 60 g 1  . lactobacillus (FLORANEX/LACTINEX) PACK Take 1 packet (1 g total) by  mouth 3 (three) times daily with meals. 30 packet 0  . Magnesium 500 MG TABS Take 500 mg by mouth daily.    . metolazone (ZAROXOLYN) 5 MG tablet TAKE 5mg  (1 tablet) every Wednesday and Saturday 15 tablet 3  . omega-3 acid ethyl esters (LOVAZA) 1 G capsule Take 1 g by mouth 2 (two) times daily.     [name] oxycodone (OXY-IR) 5 MG capsule Take 5 mg by mouth every 6 (six) hours.    . potassium chloride SA (K-DUR,KLOR-CON) 20 MEQ tablet Take 2 tablets (40 mEq total) by mouth daily. Take an additional 62meq on Wednesay and Saturday with Metolazone. 90 tablet 3  . rOPINIRole (REQUIP) 2 MG tablet Take 2 mg by mouth 2 (two) times daily.    . sertraline (ZOLOFT) 25 MG tablet Take 1 tablet (25 mg total) by mouth daily. 30 tablet 0  . spironolactone (ALDACTONE) 25 MG tablet Take 1 tablet (25 mg total) by mouth daily. 90 tablet 3  . torsemide (DEMADEX) 100 MG tablet Take 1 tablet (100 mg total) by mouth 2 (two) times daily. 60 tablet 6  . triamcinolone cream (KENALOG) 0.1 % Apply 1 application topically 2 (two) times daily as needed (rash).    . vitamin B-12 (CYANOCOBALAMIN) 500 MCG tablet Take 500 mcg by mouth daily.     No current facility-administered medications on file prior to visit.    Allergies  Allergen Reactions  . Cymbalta [Duloxetine Hcl] Other (See Comments)    Restless leg syndrome  . Statins Other (See Comments)    Statin drugs cause muscle pain / "muscle damage"--was told by MD not to take  . Sulfa Antibiotics Diarrhea  . Levemir [Insulin Detemir] Itching  . Morphine Other (See Comments)    GI upset and headaches  . Zinc Swelling and Rash    Family History  Problem Relation Age of Onset  . Heart disease Mother   . Heart disease Father   . Heart disease      family history  . Stomach cancer Paternal Grandmother   . Lung cancer Paternal Grandfather   . Colon cancer Neg Hx   . CVA      several aunts  . Heart attack Mother 71  . Heart attack Father 63  . Heart attack      several  aunts and an uncle    BP 124/63 mmHg  Pulse 67  Temp(Src) 98.1 F (36.7 C) (Oral)  Wt   SpO2 96%  Review of Systems Denies N/V    Objective:   Physical Exam VITAL SIGNS:  See vs page GENERAL: no distress.  In wheelchair Pulses: dorsalis pedis intact bilat.   MSK: no deformity of the feet  CV: trace bilat leg edema  Skin: no ulcer on the feet. normal temp on the feet. The legs have bilat spotty hyperpigmentation Neuro: sensation is intact to touch on the feet, but decreased on the feet.  Ext: There is bilateral onychomycosis of the toenails.   A1c=7.9% Lab Results  Component Value Date   CREATININE 1.08* 01/07/2015   BUN 27* 01/07/2015   NA 140 01/07/2015   K 4.1 01/07/2015   CL 96* 01/07/2015   CO2 36* 01/07/2015      Assessment & Plan:  DM: she needs increased rx, if it can be done with a regimen that avoids or minimizes hypoglycemia.   She has severe hypoglycemia 6-8 hrs after last injection of reg insulin.   Renal insuff is mild--unlikely to account for the prolonged action of insulin.    Patient is advised the following: Patient Instructions  check your blood sugar twice a day.  vary the time of day when you check, between before the 3 meals, and at bedtime.  also check if you have symptoms of your blood sugar being too high or too low.  please keep a record of the readings and bring it to your next appointment here.  You can write it on any piece of paper.  please call us sooner if your blood sugar goes below 70, or if you have a lot of readings over 200.  It is really important to let us know if you have any severe lows.   Please come back for a follow-up appointment in 2-3 months.  Please reduce the reg insulin to 70 units with breakfast, 70 units with lunch, and 50 units with the evening meal.

## 2015-01-12 NOTE — Patient Instructions (Addendum)
check your blood sugar twice a day.  vary the time of day when you check, between before the 3 meals, and at bedtime.  also check if you have symptoms of your blood sugar being too high or too low.  please keep a record of the readings and bring it to your next appointment here.  You can write it on any piece of paper.  please call us sooner if your blood sugar goes below 70, or if you have a lot of readings over 200.  It is really important to let us know if you have any severe lows.   Please come back for a follow-up appointment in 2-3 months.  Please reduce the reg insulin to 70 units with breakfast, 70 units with lunch, and 50 units with the evening meal.

## 2015-01-14 ENCOUNTER — Other Ambulatory Visit (HOSPITAL_COMMUNITY): Payer: Self-pay | Admitting: Internal Medicine

## 2015-01-22 ENCOUNTER — Ambulatory Visit (HOSPITAL_BASED_OUTPATIENT_CLINIC_OR_DEPARTMENT_OTHER)
Admission: RE | Admit: 2015-01-22 | Discharge: 2015-01-22 | Disposition: A | Discharge status: Home / Self Care | Payer: Medicare Other | Source: Ambulatory Visit | Attending: Internal Medicine | Admitting: Internal Medicine

## 2015-01-22 ENCOUNTER — Inpatient Hospital Stay (HOSPITAL_COMMUNITY)
Admission: AD | Admit: 2015-01-22 | Discharge: 2015-01-26 | DRG: 292 | Disposition: A | Discharge status: Home / Self Care | Payer: Medicare Other | Source: Ambulatory Visit | Attending: Cardiology | Admitting: Cardiology

## 2015-01-22 VITALS — BP 132/58 | HR 65 | Wt 273.0 lb

## 2015-01-22 DIAGNOSIS — Z794 Long term (current) use of insulin: Secondary | ICD-10-CM | POA: Diagnosis not present

## 2015-01-22 DIAGNOSIS — I5033 Acute on chronic diastolic (congestive) heart failure: Secondary | ICD-10-CM

## 2015-01-22 DIAGNOSIS — L03116 Cellulitis of left lower limb: Secondary | ICD-10-CM

## 2015-01-22 DIAGNOSIS — Z8379 Family history of other diseases of the digestive system: Secondary | ICD-10-CM | POA: Diagnosis not present

## 2015-01-22 DIAGNOSIS — E785 Hyperlipidemia, unspecified: Secondary | ICD-10-CM

## 2015-01-22 DIAGNOSIS — N179 Acute kidney failure, unspecified: Secondary | ICD-10-CM | POA: Diagnosis present

## 2015-01-22 DIAGNOSIS — I10 Essential (primary) hypertension: Secondary | ICD-10-CM

## 2015-01-22 DIAGNOSIS — G2581 Restless legs syndrome: Secondary | ICD-10-CM | POA: Diagnosis present

## 2015-01-22 DIAGNOSIS — I5043 Acute on chronic combined systolic (congestive) and diastolic (congestive) heart failure: Principal | ICD-10-CM | POA: Diagnosis present

## 2015-01-22 DIAGNOSIS — E1142 Type 2 diabetes mellitus with diabetic polyneuropathy: Secondary | ICD-10-CM | POA: Diagnosis present

## 2015-01-22 DIAGNOSIS — E1165 Type 2 diabetes mellitus with hyperglycemia: Secondary | ICD-10-CM

## 2015-01-22 DIAGNOSIS — Z811 Family history of alcohol abuse and dependence: Secondary | ICD-10-CM

## 2015-01-22 DIAGNOSIS — Z8249 Family history of ischemic heart disease and other diseases of the circulatory system: Secondary | ICD-10-CM | POA: Diagnosis not present

## 2015-01-22 DIAGNOSIS — K219 Gastro-esophageal reflux disease without esophagitis: Secondary | ICD-10-CM | POA: Diagnosis present

## 2015-01-22 DIAGNOSIS — L03119 Cellulitis of unspecified part of limb: Secondary | ICD-10-CM

## 2015-01-22 DIAGNOSIS — Z833 Family history of diabetes mellitus: Secondary | ICD-10-CM | POA: Diagnosis not present

## 2015-01-22 DIAGNOSIS — Z7982 Long term (current) use of aspirin: Secondary | ICD-10-CM | POA: Diagnosis not present

## 2015-01-22 DIAGNOSIS — M1990 Unspecified osteoarthritis, unspecified site: Secondary | ICD-10-CM | POA: Diagnosis present

## 2015-01-22 DIAGNOSIS — Z6841 Body Mass Index (BMI) 40.0 and over, adult: Secondary | ICD-10-CM

## 2015-01-22 DIAGNOSIS — IMO0002 Reserved for concepts with insufficient information to code with codable children: Secondary | ICD-10-CM

## 2015-01-22 DIAGNOSIS — Z800 Family history of malignant neoplasm of digestive organs: Secondary | ICD-10-CM | POA: Diagnosis not present

## 2015-01-22 DIAGNOSIS — H9190 Unspecified hearing loss, unspecified ear: Secondary | ICD-10-CM | POA: Diagnosis present

## 2015-01-22 DIAGNOSIS — G4733 Obstructive sleep apnea (adult) (pediatric): Secondary | ICD-10-CM | POA: Diagnosis present

## 2015-01-22 DIAGNOSIS — S81801D Unspecified open wound, right lower leg, subsequent encounter: Secondary | ICD-10-CM

## 2015-01-22 DIAGNOSIS — Z9981 Dependence on supplemental oxygen: Secondary | ICD-10-CM | POA: Diagnosis not present

## 2015-01-22 DIAGNOSIS — E781 Pure hyperglyceridemia: Secondary | ICD-10-CM | POA: Diagnosis present

## 2015-01-22 DIAGNOSIS — Z823 Family history of stroke: Secondary | ICD-10-CM | POA: Diagnosis not present

## 2015-01-22 DIAGNOSIS — L03115 Cellulitis of right lower limb: Secondary | ICD-10-CM | POA: Diagnosis present

## 2015-01-22 DIAGNOSIS — Z79899 Other long term (current) drug therapy: Secondary | ICD-10-CM

## 2015-01-22 LAB — CBC
HCT: 35.9 % — ABNORMAL LOW (ref 36.0–46.0)
Hemoglobin: 11.1 g/dL — ABNORMAL LOW (ref 12.0–15.0)
MCH: 26.2 pg (ref 26.0–34.0)
MCHC: 30.9 g/dL (ref 30.0–36.0)
MCV: 84.7 fL (ref 78.0–100.0)
PLATELETS: 351 10*3/uL (ref 150–400)
RBC: 4.24 MIL/uL (ref 3.87–5.11)
RDW: 15.9 % — AB (ref 11.5–15.5)
WBC: 10.6 10*3/uL — ABNORMAL HIGH (ref 4.0–10.5)

## 2015-01-22 LAB — CREATININE, SERUM
CREATININE: 1.22 mg/dL — AB (ref 0.44–1.00)
GFR calc Af Amer: 51 mL/min — ABNORMAL LOW (ref >60)
GFR, EST NON AFRICAN AMERICAN: 44 mL/min — AB (ref >60)

## 2015-01-22 LAB — GLUCOSE, CAPILLARY
Glucose-Capillary: 160 mg/dL — ABNORMAL HIGH (ref 65–99)
Glucose-Capillary: 306 mg/dL — ABNORMAL HIGH (ref 65–99)
Glucose-Capillary: 229 mg/dL — ABNORMAL HIGH (ref 65–99)

## 2015-01-22 MED ORDER — ROPINIROLE HCL 1 MG PO TABS
2.0000 mg | ORAL_TABLET | Freq: Two times a day (BID) | ORAL | Status: DC
  Administered 2015-01-22 – 2015-01-26 (×8): 2 mg via ORAL
  Filled 2015-01-22 (×9): qty 2

## 2015-01-22 MED ORDER — FLORANEX PO PACK
1.0000 g | PACK | Freq: Three times a day (TID) | ORAL | Status: DC
  Administered 2015-01-23 – 2015-01-26 (×10): 1 g via ORAL
  Filled 2015-01-22 (×14): qty 1

## 2015-01-22 MED ORDER — SODIUM CHLORIDE 0.9 % IJ SOLN
3.0000 mL | Freq: Two times a day (BID) | INTRAMUSCULAR | Status: DC
  Administered 2015-01-23 – 2015-01-26 (×5): 3 mL via INTRAVENOUS

## 2015-01-22 MED ORDER — FENOFIBRATE 160 MG PO TABS
160.0000 mg | ORAL_TABLET | Freq: Every day | ORAL | Status: DC
  Administered 2015-01-23 – 2015-01-26 (×4): 160 mg via ORAL
  Filled 2015-01-22 (×4): qty 1

## 2015-01-22 MED ORDER — SPIRONOLACTONE 25 MG PO TABS
25.0000 mg | ORAL_TABLET | Freq: Every day | ORAL | Status: DC
  Administered 2015-01-23 – 2015-01-26 (×4): 25 mg via ORAL
  Filled 2015-01-22 (×4): qty 1

## 2015-01-22 MED ORDER — ASPIRIN EC 81 MG PO TBEC
81.0000 mg | DELAYED_RELEASE_TABLET | Freq: Every day | ORAL | Status: DC
  Administered 2015-01-23 – 2015-01-26 (×4): 81 mg via ORAL
  Filled 2015-01-22 (×4): qty 1

## 2015-01-22 MED ORDER — VANCOMYCIN HCL 10 G IV SOLR
1500.0000 mg | INTRAVENOUS | Status: DC
  Administered 2015-01-23 – 2015-01-24 (×2): 1500 mg via INTRAVENOUS
  Filled 2015-01-22 (×3): qty 1500

## 2015-01-22 MED ORDER — MAGNESIUM 500 MG PO TABS: 500.0000 mg | ORAL_TABLET | Freq: Every day | ORAL | Status: DC

## 2015-01-22 MED ORDER — INSULIN ASPART 100 UNIT/ML ~~LOC~~ SOLN
70.0000 [IU] | Freq: Every day | SUBCUTANEOUS | Status: DC
  Administered 2015-01-23 – 2015-01-26 (×4): 70 [IU] via SUBCUTANEOUS

## 2015-01-22 MED ORDER — POTASSIUM CHLORIDE CRYS ER 20 MEQ PO TBCR
40.0000 meq | EXTENDED_RELEASE_TABLET | Freq: Every day | ORAL | Status: DC
  Administered 2015-01-22 – 2015-01-26 (×5): 40 meq via ORAL
  Filled 2015-01-22 (×5): qty 2

## 2015-01-22 MED ORDER — INSULIN ASPART 100 UNIT/ML ~~LOC~~ SOLN
50.0000 [IU] | Freq: Every day | SUBCUTANEOUS | Status: DC
  Administered 2015-01-22 – 2015-01-25 (×2): 50 [IU] via SUBCUTANEOUS

## 2015-01-22 MED ORDER — SODIUM CHLORIDE 0.9 % IJ SOLN: 3.0000 mL | INTRAMUSCULAR | Status: DC | PRN

## 2015-01-22 MED ORDER — ACETAMINOPHEN 325 MG PO TABS: 650.0000 mg | ORAL_TABLET | ORAL | Status: DC | PRN

## 2015-01-22 MED ORDER — OMEGA-3-ACID ETHYL ESTERS 1 G PO CAPS
1.0000 g | ORAL_CAPSULE | Freq: Two times a day (BID) | ORAL | Status: DC
  Administered 2015-01-22 – 2015-01-26 (×8): 1 g via ORAL
  Filled 2015-01-22 (×8): qty 1

## 2015-01-22 MED ORDER — ONDANSETRON HCL 4 MG/2ML IJ SOLN: 4.0000 mg | Freq: Four times a day (QID) | INTRAMUSCULAR | Status: DC | PRN

## 2015-01-22 MED ORDER — HYDROXYZINE HCL 10 MG PO TABS: 10.0000 mg | ORAL_TABLET | Freq: Three times a day (TID) | ORAL | Status: DC | PRN

## 2015-01-22 MED ORDER — INSULIN ASPART 100 UNIT/ML ~~LOC~~ SOLN: 70.0000 [IU] | Freq: Every day | SUBCUTANEOUS | Status: DC

## 2015-01-22 MED ORDER — SERTRALINE HCL 25 MG PO TABS
25.0000 mg | ORAL_TABLET | Freq: Every day | ORAL | Status: DC
  Administered 2015-01-23 – 2015-01-26 (×4): 25 mg via ORAL
  Filled 2015-01-22 (×4): qty 1

## 2015-01-22 MED ORDER — OXYCODONE HCL 5 MG PO TABS
5.0000 mg | ORAL_TABLET | Freq: Four times a day (QID) | ORAL | Status: DC | PRN
  Administered 2015-01-22 – 2015-01-26 (×6): 5 mg via ORAL
  Filled 2015-01-22 (×6): qty 1

## 2015-01-22 MED ORDER — SODIUM CHLORIDE 0.9 % IV SOLN: 250.0000 mL | INTRAVENOUS | Status: DC | PRN

## 2015-01-22 MED ORDER — VANCOMYCIN HCL 10 G IV SOLR
2000.0000 mg | Freq: Once | INTRAVENOUS | Status: AC
  Administered 2015-01-22: 2000 mg via INTRAVENOUS
  Filled 2015-01-22: qty 2000

## 2015-01-22 MED ORDER — MAGNESIUM OXIDE 400 (241.3 MG) MG PO TABS
400.0000 mg | ORAL_TABLET | Freq: Every day | ORAL | Status: DC
  Administered 2015-01-23 – 2015-01-26 (×4): 400 mg via ORAL
  Filled 2015-01-22 (×4): qty 1

## 2015-01-22 MED ORDER — INSULIN ASPART 100 UNIT/ML ~~LOC~~ SOLN
0.0000 [IU] | Freq: Three times a day (TID) | SUBCUTANEOUS | Status: DC
  Administered 2015-01-23: 3 [IU] via SUBCUTANEOUS
  Administered 2015-01-23 – 2015-01-24 (×3): 7 [IU] via SUBCUTANEOUS
  Administered 2015-01-25: 11 [IU] via SUBCUTANEOUS
  Administered 2015-01-25: 3 [IU] via SUBCUTANEOUS
  Administered 2015-01-26 (×2): 4 [IU] via SUBCUTANEOUS

## 2015-01-22 MED ORDER — FUROSEMIDE 10 MG/ML IJ SOLN
10.0000 mg/h | INTRAVENOUS | Status: DC
  Administered 2015-01-22 – 2015-01-23 (×2): 10 mg/h via INTRAVENOUS
  Filled 2015-01-22 (×6): qty 25

## 2015-01-22 MED ORDER — CARVEDILOL 25 MG PO TABS
25.0000 mg | ORAL_TABLET | Freq: Two times a day (BID) | ORAL | Status: DC
  Administered 2015-01-22 – 2015-01-26 (×8): 25 mg via ORAL
  Filled 2015-01-22 (×8): qty 1

## 2015-01-22 MED ORDER — EZETIMIBE 10 MG PO TABS
10.0000 mg | ORAL_TABLET | Freq: Every day | ORAL | Status: DC
  Administered 2015-01-23 – 2015-01-26 (×4): 10 mg via ORAL
  Filled 2015-01-22 (×4): qty 1

## 2015-01-22 MED ORDER — ENOXAPARIN SODIUM 40 MG/0.4ML ~~LOC~~ SOLN
40.0000 mg | SUBCUTANEOUS | Status: DC
  Filled 2015-01-22: qty 0.4

## 2015-01-22 MED ORDER — VITAMIN B-12 500 MCG PO TABS
500.0000 ug | ORAL_TABLET | Freq: Every day | ORAL | Status: DC
  Administered 2015-01-23 – 2015-01-26 (×4): 500 ug via ORAL
  Filled 2015-01-22 (×5): qty 1

## 2015-01-22 MED ORDER — B COMPLEX-C PO TABS
1.0000 | ORAL_TABLET | Freq: Every day | ORAL | Status: DC
  Administered 2015-01-23 – 2015-01-26 (×4): 1 via ORAL
  Filled 2015-01-22 (×4): qty 1

## 2015-01-22 MED ORDER — GABAPENTIN 300 MG PO CAPS
600.0000 mg | ORAL_CAPSULE | Freq: Four times a day (QID) | ORAL | Status: DC
Start: 2015-01-22 — End: 2015-01-26
  Administered 2015-01-22 – 2015-01-23 (×5): 600 mg via ORAL
  Administered 2015-01-23: 300 mg via ORAL
  Administered 2015-01-24 – 2015-01-26 (×9): 600 mg via ORAL
  Filled 2015-01-22: qty 2
  Filled 2015-01-22: qty 6
  Filled 2015-01-22 (×5): qty 2
  Filled 2015-01-22 (×3): qty 6
  Filled 2015-01-22 (×2): qty 2
  Filled 2015-01-22: qty 6
  Filled 2015-01-22 (×2): qty 2

## 2015-01-22 MED ORDER — HYDRALAZINE HCL 25 MG PO TABS
25.0000 mg | ORAL_TABLET | Freq: Three times a day (TID) | ORAL | Status: DC
  Administered 2015-01-22 – 2015-01-26 (×11): 25 mg via ORAL
  Filled 2015-01-22 (×11): qty 1

## 2015-01-22 NOTE — Progress Notes (Signed)
Patient ID: Melissa Myers, female   DOB: 1946/02/16, 69 y.o.   MRN: [id number]   PCP: Dr. Asa Lente Endocrinologist: Dr Loanne Drilling Primary HF: Dr Aundra Dubin.   69 yo with history of HTN, DM, hyperlipidemia, OHS/OSA, and chronic dyspnea/diastolic CHF.  Patient had an echo in 04/2009 showing moderate LVH and preserved LV systolic function. However, there was a very large LV mid-cavity gradient with valsalva.  Patient developed quite significant exertional dyspnea to the point where she was short of breath walking around her house. She had a pulmonary evaluation with Dr. Elsworth Soho but no primary lung problems were identified. Patient had an ETT-myoview in 05/2009 which was negative for ischemia or infarction. Right heart cath in 05/2009 showed mildly elevated right heart filling pressures but normal PA pressure and normal PCWP. She was started her on a beta blocker (Coreg) to try to lower her LV mid-cavity gradient (that likely occurs with exertion) and to better control BP.  V/Q scan was negative for PE.  PFTs from 06/2011 showed a restrictive defect. Last echo in 11/2013 showed EF 65% with normal RV size and systolic function.      Admitted 12/05/13 with volume overload. Diuresed with IV lasix and transitioned to torsemide 80 mg twice a day + metolazone. Overall she diuresed 21 pounds. Discharge weight was 263 pounds.   She was admitted in 9/16 with Strep agalactiae sepsis likely from cellulitis.    She returns for follow up today with her son. Says she feels awful. Leg pain and dyspnea. Ongoing dyspnea with exertion. Limited activity. Increase leg edema with pustules.Drinking > 2 liters.  Taking all medications.   Labs (10/13): K 4.1, creatinine 0.85 Labs (4/14): K 3.7, creatinine 1.2, BUN 43, BNP 23 Labs (5/14): K 4.1, creatinine 1.0 Labs (7/14): K 4.6 Creatinine 1.0 BNP 39.0  Labs (8/14): K 3.5, creatinine 0.91, BNP 71 Labs (11/14): K 3.7, creatinine 0.9 Labs (12/14): K 4.1, creatinine 0.8, BNP 52 Labs  (3/15): K 4, creatinine 0.9 Labs (4/15): K 3.7, creatinine 0.9 Labs (5/15): K 3.8, creatinine 0.9, BNP 29 Labs (6/15): K 3.4, BUN 105, creatinine 0.9 =>1.7 Labs (6/17/5) K 4.2, BUN 41, Creatinine 0.80 Labs (08/19/13) K 3.2, BUN 71, creatinine 1.5 Labs 09/05/13 K 4.1 Creatinine 1.1 Labs 11/07/13 K 3.6 Creatinine 0.91 Labs 12/11/13 K 4.5 Creatine 1.05 Labs (12/15): K 3.8, creatinine 1.03 Labs (03/17/2014): K 4.1 Creatinine 0.99  Labs (4/16): K 3.7, creatinine 1.24 Labs (5/16): LDL 108, TGs 699 Labs (07/24/2014) K 3.5 Creatinine 1.17  Labs (07/31/2014) K 3.3 Creatinine 1.13 Labs (9/16): K 3.5, creatinine 0.93  Allergies (verified):  1) ! Sulfa  2) Morphine   Past Medical History:  1. Diabetes mellitus, type II  2. Hyperlipidemia: She has been unable to tolerate statins (has been on Vytorin, lovastatin, and Crestor) due to what sounds like rhabdo: developed muscle weakness and was told she had "muscle damage" with each of these statins. She has been told not to take statins anymore.  Has hypertriglyceridemia.  3. Hypertension 4. Obesity  5. Restless Leg Syndrome  6. OA  7. peripheral neuropathy  8. OHS/OSA: Uses supplemental oxygen, does not tolerate CPAP.   9. Chronic nausea/diarrhea: ? IBS  10. Diastolic CHF: Echo (Q000111Q) with normal LV size, moderate LVH, EF 65-70%, LV mid-cavity gradient reaching 63 mmHg with valsalva but minimal at rest, grade I diastolic dysfunction, cannot estimate PA systolic pressure (no TR doppler signal), RV normal. RHC (4/11): Mean RA 11, RV 37/13, PA 33/15, mean PCWP 12,  CI 3.3. Echo in 11/12 showed mild LVH, EF 65-70%, no LV mid-cavity gradient was mentioned.  Echo in 4/14 showed EF 65-70%, mild LVH, grade I diastolic dysfunction, PA systolic pressure 35 mmHg, mild LV mid-cavity gradient.  Echo (5/15) with EF 60-65%, mild TR, normal RV size and systolic function. Echo (10/15) with EF 65%, moderate LVH, normal RV.  11. ETT-myoview (4/11): 5'30", stopped due to  fatigue, normal EF, no evidence for scar or ischemia. 12. V/Q scan 11/12: negative for PE.  Lower extremity venous doppler US (3/14): negative for DVT.  13. PFTs (5/13): FVC 50%, FEV1 62%, ratio 106%, DLCO 69%, TLC 69%.   Family History:  Family History of Alcoholism/Addiction (parent)  Family History Diabetes 1st degree relative (grandparent)  Family History High cholesterol (parent, grandparent)  Family History Hypertension (parent, grandparent)  Family History Lung cancer (grandparent)  Stomach cancer (grandmother)  Celiac Sprue daughter  Mother with MI at 26, father with MI at 44, uncle with MI at 35, brother with stents in his 39s, multiple aunts with MIs and CVAs   Social History:  Never Smoked  no alcohol  married, lives with spouse and her mother  retired Network engineer - now housewife  Alcohol Use - no  Illicit Drug Use - no  Review of Systems  All systems reviewed and negative except as per HPI.   ROS: All systems reviewed and negative except as per HPI.   Current Outpatient Prescriptions  Medication Sig Dispense Refill  . aspirin EC 81 MG tablet Take 1 tablet (81 mg total) by mouth daily.    . B Complex-C (B-COMPLEX WITH VITAMIN C) tablet Take 1 tablet by mouth daily.    . BD INSULIN SYRINGE ULTRAFINE 31G X 15/64" 1 ML MISC USE TO INJECT INSULIN 4 TIMES DAILY AS ADVISED. 100 each 4  . carvedilol (COREG) 25 MG tablet TAKE 1 TABLET BY MOUTH TWICE DAILY WITH FOOD 60 tablet 5  . Cholecalciferol (VITAMIN D3) 2000 UNITS capsule Take 2,000 Units by mouth 4 (four) times daily - after meals and at bedtime.     . diclofenac sodium (VOLTAREN) 1 % GEL Apply 2 g topically daily as needed (knee pain). 10 g 3  . ezetimibe (ZETIA) 10 MG tablet Take 1 tablet (10 mg total) by mouth daily. 90 tablet 3  . fenofibrate 160 MG tablet TAKE 1 TABLET BY MOUTH DAILY 30 tablet 4  . gabapentin (NEURONTIN) 300 MG capsule Take 600 mg by mouth 4 (four) times daily.    . Glucosamine HCl 1000 MG TABS Take  1,000 mg by mouth 2 (two) times daily.    . hydrALAZINE (APRESOLINE) 25 MG tablet TAKE 1 TABLET (25 MG TOTAL) BY MOUTH 3 (THREE) TIMES DAILY. 90 tablet 2  . hydrOXYzine (ATARAX/VISTARIL) 10 MG tablet Take 10 mg by mouth 3 (three) times daily as needed for anxiety.    . insulin regular (HUMULIN R) 100 units/mL injection 70 units with breakfast, 70 units with lunch, and 50 units with the evening meal, and syringes 3/day 60 mL 11  . ketoconazole (NIZORAL) 2 % cream Apply 1 application topically daily. 60 g 1  . lactobacillus (FLORANEX/LACTINEX) PACK Take 1 packet (1 g total) by mouth 3 (three) times daily with meals. 30 packet 0  . Magnesium 500 MG TABS Take 500 mg by mouth daily.    . metolazone (ZAROXOLYN) 5 MG tablet TAKE 5mg  (1 tablet) every Wednesday and Saturday 15 tablet 3  . omega-3 acid ethyl esters (LOVAZA) 1  G capsule Take 1 g by mouth 2 (two) times daily.     [name] oxycodone (OXY-IR) 5 MG capsule Take 5 mg by mouth every 6 (six) hours.    . potassium chloride SA (K-DUR,KLOR-CON) 20 MEQ tablet Take 2 tablets (40 mEq total) by mouth daily. Take an additional 43meq on Wednesay and Saturday with Metolazone. 90 tablet 3  . rOPINIRole (REQUIP) 2 MG tablet Take 2 mg by mouth 2 (two) times daily.    . sertraline (ZOLOFT) 25 MG tablet Take 1 tablet (25 mg total) by mouth daily. 30 tablet 0  . spironolactone (ALDACTONE) 25 MG tablet Take 1 tablet (25 mg total) by mouth daily. 90 tablet 3  . spironolactone (ALDACTONE) 25 MG tablet TAKE 1 TABLET BY MOUTH DAILY 30 tablet 2  . torsemide (DEMADEX) 100 MG tablet Take 1 tablet (100 mg total) by mouth 2 (two) times daily. 60 tablet 6  . triamcinolone cream (KENALOG) 0.1 % Apply 1 application topically 2 (two) times daily as needed (rash).    . vitamin B-12 (CYANOCOBALAMIN) 500 MCG tablet Take 500 mcg by mouth daily.     No current facility-administered medications for this encounter.   Filed Vitals:   01/22/15 1218  BP: 132/58  Pulse: 65  Weight: 273 lb  (123.832 kg)  SpO2: 94%    General: NAD, obese. Son present . Arrived in wheelchair.   Neck: Thick, JVP to jaw  No thyromegaly or thyroid nodule.  Lungs: Clear to auscultation bilaterally with normal respiratory effort. Chronic 2L oxygen CV: Nondisplaced PMI.  Heart regular S1/S2, no S3/S4, 2/6 SEM RUSB.    No carotid bruit.  Abdomen: obese, + distended, nontender, no hepatosplenomegaly Neurologic: Alert and oriented x 3.  Psych: Normal affect. Extremities: No clubbing/cyanosis, bilateral venous stasis.Erythema with pustules. 3+ edema to knees bilaterally.    Assessment/Plan: 1. Acute/Chronic diastolic CHF:   NYHA III. Marked volume overload. Volume overloaded today. Diurese with IV lasix.  2. OHS:  Followed by Dr Elsworth Soho. Per Dr Elsworth Soho no role for CPAP yet. Continue home oxygen (at all times). 3. Obesity:  4. HTN: Stable. Continue current regimen.   5. Hyperlipidemia: Very high triglycerides. Lovaza added and she will continue Zetia and Fenofibrate.  6. Cellulitis-  In September she was admitted for cellulitis-->streptococcus. Treated with clinidamycin. Will start vancomycin + zosyn.     Admit to telemetry for volume overload and cellulitis. Check labs.   Amy Clegg  NP-C  12:19 PM

## 2015-01-22 NOTE — Consult Note (Signed)
ANTIBIOTIC CONSULT NOTE - INITIAL  Pharmacy Consult for Vancomycin Indication: cellulitis  Allergies  Allergen Reactions  . Cymbalta [Duloxetine Hcl] Other (See Comments)    Restless leg syndrome  . Statins Other (See Comments)    Statin drugs cause muscle pain / "muscle damage"--was told by MD not to take  . Sulfa Antibiotics Diarrhea  . Levemir [Insulin Detemir] Itching  . Morphine Other (See Comments)    GI upset and headaches  . Zinc Swelling and Rash    Patient Measurements: Weight = 123.8kg  Vital Signs: BP: 132/58 mmHg (12/01 1218) Pulse Rate: 65 (12/01 1218) Intake/Output from previous day:   Intake/Output from this shift:    Labs:  Recent Labs  01/22/15 1300  WBC 10.6*  HGB 11.1*  PLT 351  CREATININE 1.22*   Estimated Creatinine Clearance: 51.8 mL/min (by C-G formula based on Cr of 1.22).  Microbiology: No results found for this or any previous visit (from the past 720 hour(s)).  Medical History: Past Medical History  Diagnosis Date  . Type II or unspecified type diabetes mellitus without mention of complication, not stated as uncontrolled     insulin dep  . Hyperlipidemia     hx rhabdo on statins  . Hypertension   . Diastolic CHF ([place])   . Chronic diarrhea     a/w nausea - felt related to IBS  . RLS (restless legs syndrome)   . Osteoarthritis   . Stasis dermatitis   . GERD (gastroesophageal reflux disease)   . On home oxygen therapy     uses oxygen 2 liters min per [place] at night and prn during day  . Anemia   . Neuropathy ([facility])     feet, toes and fingers  . Disc degeneration, lumbar   . OSA (obstructive sleep apnea)     05/2009 sleep study - refuses CPAP  . Deaf     left side only  . Shortness of breath     chronic   Assessment: 69yof admitted from heart failure clinic with volume overload and redness/pustules on her lower extremities. She was treated for streptococcal cellulitis back in September with clindamycin. She will begin  vancomycin for now. Serum creatnine slightly elevated from baseline at 1.22. Will use obesity dosing nomogram given weight 123.8kg.  Goal of Therapy:  Vancomycin trough level 10-15 mcg/ml  Plan:  1) Vancomycin 2g IV x 1 then 1500mg  IV q24 2) Follow renal function, cultures, LOT, level if needed  Deboraha Sprang 01/22/2015,2:49 PM

## 2015-01-22 NOTE — H&P (Signed)
PCP: Dr. Asa Lente Endocrinologist: Dr Loanne Drilling Primary HF: Dr Aundra Dubin.   69 yo with history of HTN, DM, hyperlipidemia, OHS/OSA, and chronic dyspnea/diastolic CHF.  Patient had an echo in 04/2009 showing moderate LVH and preserved LV systolic function. However, there was a very large LV mid-cavity gradient with valsalva. Patient developed quite significant exertional dyspnea to the point where she was short of breath walking around her house. She had a pulmonary evaluation with Dr. Elsworth Soho but no primary lung problems were identified. Patient had an ETT-myoview in 05/2009 which was negative for ischemia or infarction. Right heart cath in 05/2009 showed mildly elevated right heart filling pressures but normal PA pressure and normal PCWP. She was started her on a beta blocker (Coreg) to try to lower her LV mid-cavity gradient (that likely occurs with exertion) and to better control BP. V/Q scan was negative for PE. PFTs from 06/2011 showed a restrictive defect. Last echo in 11/2013 showed EF 65% with normal RV size and systolic function.   Admitted 12/05/13 with volume overload. Diuresed with IV lasix and transitioned to torsemide 80 mg twice a day + metolazone. Overall she diuresed 21 pounds. Discharge weight was 263 pounds.   She was admitted in 9/16 with Strep agalactiae sepsis likely from cellulitis.   She returns for follow up today with her son. Says she feels awful. Leg pain and dyspnea. Ongoing dyspnea with exertion. Limited activity. Increase leg edema with pustules.Drinking > 2 liters. Taking all medications.   Labs (10/13): K 4.1, creatinine 0.85 Labs (4/14): K 3.7, creatinine 1.2, BUN 43, BNP 23 Labs (5/14): K 4.1, creatinine 1.0 Labs (7/14): K 4.6 Creatinine 1.0 BNP 39.0  Labs (8/14): K 3.5, creatinine 0.91, BNP 71 Labs (11/14): K 3.7, creatinine 0.9 Labs (12/14): K 4.1, creatinine 0.8, BNP 52 Labs (3/15): K 4, creatinine 0.9 Labs (4/15): K 3.7, creatinine 0.9 Labs (5/15): K 3.8,  creatinine 0.9, BNP 29 Labs (6/15): K 3.4, BUN 105, creatinine 0.9 =>1.7 Labs (6/17/5) K 4.2, BUN 41, Creatinine 0.80 Labs (08/19/13) K 3.2, BUN 71, creatinine 1.5 Labs 09/05/13 K 4.1 Creatinine 1.1 Labs 11/07/13 K 3.6 Creatinine 0.91 Labs 12/11/13 K 4.5 Creatine 1.05 Labs (12/15): K 3.8, creatinine 1.03 Labs (03/17/2014): K 4.1 Creatinine 0.99  Labs (4/16): K 3.7, creatinine 1.24 Labs (5/16): LDL 108, TGs 699 Labs (07/24/2014) K 3.5 Creatinine 1.17  Labs (07/31/2014) K 3.3 Creatinine 1.13 Labs (9/16): K 3.5, creatinine 0.93  Allergies (verified):  1) ! Sulfa  2) Morphine   Past Medical History:  1. Diabetes mellitus, type II  2. Hyperlipidemia: She has been unable to tolerate statins (has been on Vytorin, lovastatin, and Crestor) due to what sounds like rhabdo: developed muscle weakness and was told she had "muscle damage" with each of these statins. She has been told not to take statins anymore. Has hypertriglyceridemia.  3. Hypertension 4. Obesity  5. Restless Leg Syndrome  6. OA  7. peripheral neuropathy  8. OHS/OSA: Uses supplemental oxygen, does not tolerate CPAP.  9. Chronic nausea/diarrhea: ? IBS  10. Diastolic CHF: Echo (Q000111Q) with normal LV size, moderate LVH, EF 65-70%, LV mid-cavity gradient reaching 63 mmHg with valsalva but minimal at rest, grade I diastolic dysfunction, cannot estimate PA systolic pressure (no TR doppler signal), RV normal. RHC (4/11): Mean RA 11, RV 37/13, PA 33/15, mean PCWP 12, CI 3.3. Echo in 11/12 showed mild LVH, EF 65-70%, no LV mid-cavity gradient was mentioned. Echo in 4/14 showed EF 65-70%, mild LVH, grade I diastolic dysfunction,  PA systolic pressure 35 mmHg, mild LV mid-cavity gradient. Echo (5/15) with EF 60-65%, mild TR, normal RV size and systolic function. Echo (10/15) with EF 65%, moderate LVH, normal RV.  11. ETT-myoview (4/11): 5'30", stopped due to fatigue, normal EF, no evidence for scar or ischemia. 12. V/Q scan 11/12:  negative for PE. Lower extremity venous doppler US (3/14): negative for DVT.  13. PFTs (5/13): FVC 50%, FEV1 62%, ratio 106%, DLCO 69%, TLC 69%.   Family History:  Family History of Alcoholism/Addiction (parent)  Family History Diabetes 1st degree relative (grandparent)  Family History High cholesterol (parent, grandparent)  Family History Hypertension (parent, grandparent)  Family History Lung cancer (grandparent)  Stomach cancer (grandmother)  Celiac Sprue daughter  Mother with MI at 24, father with MI at 33, uncle with MI at 64, brother with stents in his 25s, multiple aunts with MIs and CVAs   Social History:  Never Smoked  no alcohol  married, lives with spouse and her mother  retired Network engineer - now housewife  Alcohol Use - no  Illicit Drug Use - no  Review of Systems  All systems reviewed and negative except as per HPI.   ROS: All systems reviewed and negative except as per HPI.   Current Outpatient Prescriptions  Medication Sig Dispense Refill  . aspirin EC 81 MG tablet Take 1 tablet (81 mg total) by mouth daily.    . B Complex-C (B-COMPLEX WITH VITAMIN C) tablet Take 1 tablet by mouth daily.    . BD INSULIN SYRINGE ULTRAFINE 31G X 15/64" 1 ML MISC USE TO INJECT INSULIN 4 TIMES DAILY AS ADVISED. 100 each 4  . carvedilol (COREG) 25 MG tablet TAKE 1 TABLET BY MOUTH TWICE DAILY WITH FOOD 60 tablet 5  . Cholecalciferol (VITAMIN D3) 2000 UNITS capsule Take 2,000 Units by mouth 4 (four) times daily - after meals and at bedtime.     . diclofenac sodium (VOLTAREN) 1 % GEL Apply 2 g topically daily as needed (knee pain). 10 g 3  . ezetimibe (ZETIA) 10 MG tablet Take 1 tablet (10 mg total) by mouth daily. 90 tablet 3  . fenofibrate 160 MG tablet TAKE 1 TABLET BY MOUTH DAILY 30 tablet 4  . gabapentin (NEURONTIN) 300 MG capsule Take 600 mg by mouth 4 (four) times daily.    . Glucosamine HCl 1000 MG TABS Take 1,000  mg by mouth 2 (two) times daily.    . hydrALAZINE (APRESOLINE) 25 MG tablet TAKE 1 TABLET (25 MG TOTAL) BY MOUTH 3 (THREE) TIMES DAILY. 90 tablet 2  . hydrOXYzine (ATARAX/VISTARIL) 10 MG tablet Take 10 mg by mouth 3 (three) times daily as needed for anxiety.    . insulin regular (HUMULIN R) 100 units/mL injection 70 units with breakfast, 70 units with lunch, and 50 units with the evening meal, and syringes 3/day 60 mL 11  . ketoconazole (NIZORAL) 2 % cream Apply 1 application topically daily. 60 g 1  . lactobacillus (FLORANEX/LACTINEX) PACK Take 1 packet (1 g total) by mouth 3 (three) times daily with meals. 30 packet 0  . Magnesium 500 MG TABS Take 500 mg by mouth daily.    . metolazone (ZAROXOLYN) 5 MG tablet TAKE 5mg  (1 tablet) every Wednesday and Saturday 15 tablet 3  . omega-3 acid ethyl esters (LOVAZA) 1 G capsule Take 1 g by mouth 2 (two) times daily.     [name] oxycodone (OXY-IR) 5 MG capsule Take 5 mg by mouth every 6 (six) hours.    [name]  potassium chloride SA (K-DUR,KLOR-CON) 20 MEQ tablet Take 2 tablets (40 mEq total) by mouth daily. Take an additional 47meq on Wednesay and Saturday with Metolazone. 90 tablet 3  . rOPINIRole (REQUIP) 2 MG tablet Take 2 mg by mouth 2 (two) times daily.    . sertraline (ZOLOFT) 25 MG tablet Take 1 tablet (25 mg total) by mouth daily. 30 tablet 0  . spironolactone (ALDACTONE) 25 MG tablet Take 1 tablet (25 mg total) by mouth daily. 90 tablet 3  . spironolactone (ALDACTONE) 25 MG tablet TAKE 1 TABLET BY MOUTH DAILY 30 tablet 2  . torsemide (DEMADEX) 100 MG tablet Take 1 tablet (100 mg total) by mouth 2 (two) times daily. 60 tablet 6  . triamcinolone cream (KENALOG) 0.1 % Apply 1 application topically 2 (two) times daily as needed (rash).    . vitamin B-12 (CYANOCOBALAMIN) 500 MCG tablet Take 500 mcg by mouth daily.     No current facility-administered medications for this  encounter.   Filed Vitals:   01/22/15 1218  BP: 132/58  Pulse: 65  Weight: 273 lb (123.832 kg)  SpO2: 94%    General: NAD, obese. Son present . Arrived in wheelchair.  Neck: Thick, JVP to jaw No thyromegaly or thyroid nodule.  Lungs: Clear to auscultation bilaterally with normal respiratory effort. Chronic 2L oxygen CV: Nondisplaced PMI. Heart regular S1/S2, no S3/S4, 2/6 SEM RUSB. No carotid bruit.  Abdomen: obese, + distended, nontender, no hepatosplenomegaly Neurologic: Alert and oriented x 3.  Psych: Normal affect. Extremities: No clubbing/cyanosis, bilateral venous stasis.Erythema with pustules. 3+ edema to knees bilaterally.    Assessment/Plan: 1. Acute/Chronic diastolic CHF:  NYHA III. Marked volume overload. Volume overloaded today. Diurese with IV lasix.  2. OHS: Followed by Dr Elsworth Soho. Per Dr Elsworth Soho no role for CPAP yet. Continue home oxygen (at all times). 3. Obesity:  4. HTN: Stable. Continue current regimen.  5. Hyperlipidemia: Very high triglycerides. Lovaza added and she will continue Zetia and Fenofibrate.  6. Cellulitis- In September she was admitted for cellulitis-->streptococcus. Treated with clinidamycin. Will start vancomycin  7. Diabetes - Check hgb A1C . Sliding scale ac and hs.    Admit to telemetry for volume overload and cellulitis. Check labs.  Amy Clegg NP-C  12:19 PM          Patient seen with PA, agree with the above note.   1. Acute on chronic systolic CHF: Mrs Qualls is volume overloaded on exam, will start Lasix gtt at 10 mg/hr, follow K and creatinine.  2. Cellulitis: Redness/pustules on lower legs.  Had Strep cellulitis in 9/16.  Will treat with vancomycin for now.   Loralie Champagne 01/22/2015 2:19 PM

## 2015-01-23 DIAGNOSIS — L03115 Cellulitis of right lower limb: Secondary | ICD-10-CM

## 2015-01-23 LAB — BLOOD GAS, ARTERIAL
Acid-Base Excess: 11.3 mmol/L — ABNORMAL HIGH (ref 0.0–2.0)
Bicarbonate: 36.3 mEq/L — ABNORMAL HIGH (ref 20.0–24.0)
Drawn by: 235881
O2 Content: 2 L/min
O2 Saturation: 95.5 %
Patient temperature: 98.6
TCO2: 38 mmol/L (ref 0–100)
pCO2 arterial: 57 mmHg — ABNORMAL HIGH (ref 35.0–45.0)
pH, Arterial: 7.419 (ref 7.350–7.450)
pO2, Arterial: 77.8 mmHg — ABNORMAL LOW (ref 80.0–100.0)

## 2015-01-23 LAB — BASIC METABOLIC PANEL
ANION GAP: 10 (ref 5–15)
Anion gap: 14 (ref 5–15)
BUN: 23 mg/dL — ABNORMAL HIGH (ref 6–20)
BUN: 23 mg/dL — AB (ref 6–20)
CALCIUM: 8.9 mg/dL (ref 8.9–10.3)
CALCIUM: 9.1 mg/dL (ref 8.9–10.3)
CHLORIDE: 91 mmol/L — AB (ref 101–111)
CO2: 26 mmol/L (ref 22–32)
CO2: 34 mmol/L — AB (ref 22–32)
CREATININE: 1.05 mg/dL — AB (ref 0.44–1.00)
Chloride: 97 mmol/L — ABNORMAL LOW (ref 101–111)
Creatinine, Ser: 5.68 mg/dL — ABNORMAL HIGH (ref 0.44–1.00)
GFR calc non Af Amer: 53 mL/min — ABNORMAL LOW (ref >60)
GFR, EST AFRICAN AMERICAN: 8 mL/min — AB (ref >60)
GFR, EST NON AFRICAN AMERICAN: 7 mL/min — AB (ref >60)
GLUCOSE: 128 mg/dL — AB (ref 65–99)
Glucose, Bld: 195 mg/dL — ABNORMAL HIGH (ref 65–99)
POTASSIUM: 4.2 mmol/L (ref 3.5–5.1)
Potassium: 3.6 mmol/L (ref 3.5–5.1)
SODIUM: 139 mmol/L (ref 135–145)
Sodium: 133 mmol/L — ABNORMAL LOW (ref 135–145)

## 2015-01-23 LAB — GLUCOSE, CAPILLARY
GLUCOSE-CAPILLARY: 204 mg/dL — AB (ref 65–99)
Glucose-Capillary: 226 mg/dL — ABNORMAL HIGH (ref 65–99)
Glucose-Capillary: 127 mg/dL — ABNORMAL HIGH (ref 65–99)
Glucose-Capillary: 110 mg/dL — ABNORMAL HIGH (ref 65–99)

## 2015-01-23 LAB — HEMOGLOBIN A1C
HEMOGLOBIN A1C: 8.7 % — AB (ref 4.8–5.6)
MEAN PLASMA GLUCOSE: 203 mg/dL

## 2015-01-23 MED ORDER — HEPARIN SODIUM (PORCINE) 5000 UNIT/ML IJ SOLN
5000.0000 [IU] | Freq: Three times a day (TID) | INTRAMUSCULAR | Status: DC
  Administered 2015-01-23 – 2015-01-26 (×10): 5000 [IU] via SUBCUTANEOUS
  Filled 2015-01-23 (×10): qty 1

## 2015-01-23 MED ORDER — INSULIN ASPART 100 UNIT/ML ~~LOC~~ SOLN
70.0000 [IU] | Freq: Every day | SUBCUTANEOUS | Status: DC
  Administered 2015-01-23 – 2015-01-26 (×4): 70 [IU] via SUBCUTANEOUS

## 2015-01-23 NOTE — Progress Notes (Addendum)
Advanced Heart Failure Rounding Note  PCP: Dr Asa Lente Primary HF: Dr. Aundra Dubin   Subjective:    Out ~ 2 L x 24 hrs.  Weight shows down 10 lbs, will have reweigh standing.  Recent d/c weight 264 lb.  Creatinine up to 5.68 from 1.22 ???  Objective:   Weight Range: 263 lb 12.8 oz (119.659 kg) Body mass index is 55.15 kg/(m^2).   Vital Signs:   Temp:  [98.1 F (36.7 C)-98.6 F (37 C)] 98.5 F (36.9 C) (12/02 0435) Pulse Rate:  [69-75] 69 (12/02 0435) Resp:  [18] 18 (12/02 0435) BP: (126-137)/(51-53) 126/52 mmHg (12/02 0435) SpO2:  [91 %-99 %] 97 % (12/02 0435) Weight:  [263 lb 12.8 oz (119.659 kg)-273 lb 1.6 oz (123.877 kg)] 263 lb 12.8 oz (119.659 kg) (12/02 0435)    Weight change: Filed Weights   01/22/15 1303 01/22/15 1404 01/23/15 0435  Weight: 273 lb 1.6 oz (123.877 kg) 273 lb 1.6 oz (123.877 kg) 263 lb 12.8 oz (119.659 kg)    Intake/Output:   Intake/Output Summary (Last 24 hours) at 01/23/15 0804 Last data filed at 01/23/15 0544  Gross per 24 hour  Intake    960 ml  Output   2950 ml  Net  -1990 ml     Physical Exam: General: NAD, obese. Neck: Thick, JVP 10-12 cm. No thyromegaly or nodule noted Lungs: CTAB, normal effort. Chronic 2L oxygen CV: Nondisplaced PMI. No rubs or gallops  2/6 SEM RUSB. No carotid bruit.  Abdomen: obese, + distended, NT, no HSM, + BS Neurologic: Alert and oriented x 3.  Psych: Normal affect. Extremities: No clubbing/cyanosis, bilateral venous stasis.Erythema with pustules. 2+ edema to knees bilaterally.   Telemetry: NSR  Labs: CBC  Recent Labs  01/22/15 1300  WBC 10.6*  HGB 11.1*  HCT 35.9*  MCV 84.7  PLT [id number]   Basic Metabolic Panel  Recent Labs  01/22/15 1300 01/23/15 0516  NA  --  133*  K  --  4.2  CL  --  97*  CO2  --  26  GLUCOSE  --  128*  BUN  --  23*  CREATININE 1.22* 5.68*  CALCIUM  --  8.9   Liver Function Tests No results for input(s): AST, ALT, ALKPHOS, BILITOT, PROT, ALBUMIN in the last 72  hours. No results for input(s): LIPASE, AMYLASE in the last 72 hours. Cardiac Enzymes No results for input(s): CKTOTAL, CKMB, CKMBINDEX, TROPONINI in the last 72 hours.  BNP: BNP (last 3 results) No results for input(s): BNP in the last 8760 hours.  ProBNP (last 3 results) No results for input(s): PROBNP in the last 8760 hours.   D-Dimer No results for input(s): DDIMER in the last 72 hours. Hemoglobin A1C  Recent Labs  01/22/15 1300  HGBA1C 8.7*   Fasting Lipid Panel No results for input(s): CHOL, HDL, LDLCALC, TRIG, CHOLHDL, LDLDIRECT in the last 72 hours. Thyroid Function Tests No results for input(s): TSH, T4TOTAL, T3FREE, THYROIDAB in the last 72 hours.  Invalid input(s): FREET3  Other results:     Imaging/Studies:   No results found.  Latest Echo  Latest Cath   Medications:     Scheduled Medications: . aspirin EC  81 mg Oral Daily  . B-complex with vitamin C  1 tablet Oral Daily  . carvedilol  25 mg Oral BID WC  . enoxaparin (LOVENOX) injection  40 mg Subcutaneous Q24H  . ezetimibe  10 mg Oral Daily  . fenofibrate  160 mg Oral Daily  .  gabapentin  600 mg Oral QID  . hydrALAZINE  25 mg Oral 3 times per day  . insulin aspart  0-20 Units Subcutaneous TID WC  . insulin aspart  50 Units Subcutaneous Q supper  . insulin aspart  70 Units Subcutaneous Q lunch  . insulin aspart  70 Units Subcutaneous Q breakfast  . lactobacillus  1 g Oral TID WC  . magnesium oxide  400 mg Oral Daily  . omega-3 acid ethyl esters  1 g Oral BID  . potassium chloride SA  40 mEq Oral Daily  . rOPINIRole  2 mg Oral BID  . sertraline  25 mg Oral Daily  . sodium chloride  3 mL Intravenous Q12H  . spironolactone  25 mg Oral Daily  . vancomycin  1,500 mg Intravenous Q24H  . vitamin B-12  500 mcg Oral Daily     Infusions: . furosemide (LASIX) infusion 10 mg/hr (01/22/15 1501)     PRN Medications:  sodium chloride, acetaminophen, hydrOXYzine, ondansetron (ZOFRAN) IV,  oxyCODONE, sodium chloride   Assessment/Plan   1. Acute on chronic systolic CHF:  - Continue Lasix gtt at 10 mg/hr, - K stable - Recheck stat BMET. Follow creatinine closely. She is out ~2 L x 24 hrs 2. Cellulitis:  - Recent hx of Strep cellulitis in 9/16.  - Continue vancomycin 3. AKI - With gross, marked elevation in Cr likely a lab error.  Will send a stat repeat and follow closely.  - Of note, she is being and has stable BP.   Length of Stay: 1  Latasia Friar PA-C 01/23/2015, 8:04 AM  Advanced Heart Failure Team Pager [phone] (M-F; 7a - 4p)  Please contact [facility] for night-coverage after hours (4p -7a ) and weekends on [email or web address]  Patient seen with PA, agree with the above note.    Patient diuresed well yesterday, weight down.  She continues to diurese this morning.  BMET today shows creatinine up to 5.68 from 1.22, strong suspicion that this is lab error.  Will send another BMET stat today. If creatinine is ok, will continue Lasix gtt as she remains volume overloaded.   Continue vancomycin for possible lower extremity cellulitis.  I will have wound care take a look at the ulcers on her right lower leg.   Loralie Champagne 01/23/2015 8:19 AM  Creatinine repeated and is ok.  Continue current Lasix.   Loralie Champagne 01/23/2015 3:49 PM

## 2015-01-23 NOTE — Progress Notes (Signed)
Pt's family instructed RN not to wake the patient back up after she has fallen asleep tonight. Lovenox held and midnight vital signs refused unless patient wakes back up. Will continue to monitor.

## 2015-01-23 NOTE — Progress Notes (Signed)
Pt Cr 5.68, PA notified and ordered repeat lab draw stat.  Will continue to monitor.

## 2015-01-23 NOTE — Progress Notes (Signed)
Repeat BMET came back with Cr 1.05.   Earlier cr of 5.68 lab error.    No change in treatment.   Legrand Como [address] [place], PA-C 01/23/2015 9:42 AM

## 2015-01-23 NOTE — Progress Notes (Signed)
Utilization review completed. Shelton Soler, RN, BSN. 

## 2015-01-23 NOTE — Progress Notes (Signed)
Orthopedic Tech Progress Note Patient Details:  Melissa Myers 03/10/1945 [id number]  Ortho Devices Type of Ortho Device: Louretta Parma boot Ortho Device/Splint Location: bilateral Ortho Device/Splint Interventions: Application   Hildred Priest 01/23/2015, 10:16 AM

## 2015-01-23 NOTE — Consult Note (Signed)
WOC wound consult note Reason for Consult: LE edema/weeping Discussed LE edema with patient, she has been prescribed long term compression hose previously but they have "never fit". She obtained these at a local DME supplier. She reports also the use of compression wraps per a HHRN in the past.  We discussed the need for compression therapy and long term use of compression therapy. She is in agreement she would like to try Unna's boots again, she is less ambulatory now and may need 4 layer compression (Profore) if she is not getting around more once back at home. Will let HHRN know to reevaluate at that time.   Wound type: no open ulcers at the time of my assessment today She has bilaterally 2+ pitting edema and palpable pulses, hemosiderin staining bilaterally. Some erythema of the RLE . Dressing procedure/placement/frequency: Unna's boots bilaterally, no topical care needed at this time. Will need HHRN to change Unna's boots 2x week Tuesday/Fridays and assist patient once swelling down to learn to don long term compression hose.  Discussed POC with patient and bedside nurse.  Re consult if needed, will not follow at this time. Thanks  Raynor Calcaterra Kellogg, [place] [phone])

## 2015-01-23 NOTE — Progress Notes (Signed)
Pt very sleepy and falling asleep during sentences with RN.  PA notified and ABG ordered. Will continue to monitor.

## 2015-01-24 DIAGNOSIS — L03116 Cellulitis of left lower limb: Secondary | ICD-10-CM

## 2015-01-24 LAB — CBC
HEMATOCRIT: 35 % — AB (ref 36.0–46.0)
Hemoglobin: 10.6 g/dL — ABNORMAL LOW (ref 12.0–15.0)
MCH: 25.8 pg — AB (ref 26.0–34.0)
MCHC: 30.3 g/dL (ref 30.0–36.0)
MCV: 85.2 fL (ref 78.0–100.0)
PLATELETS: 330 10*3/uL (ref 150–400)
RBC: 4.11 MIL/uL (ref 3.87–5.11)
RDW: 15.9 % — AB (ref 11.5–15.5)
WBC: 8.8 10*3/uL (ref 4.0–10.5)

## 2015-01-24 LAB — BASIC METABOLIC PANEL
Anion gap: 10 (ref 5–15)
BUN: 25 mg/dL — AB (ref 6–20)
CHLORIDE: 95 mmol/L — AB (ref 101–111)
CO2: 34 mmol/L — AB (ref 22–32)
CREATININE: 1.1 mg/dL — AB (ref 0.44–1.00)
Calcium: 8.9 mg/dL (ref 8.9–10.3)
GFR calc Af Amer: 58 mL/min — ABNORMAL LOW (ref >60)
GFR calc non Af Amer: 50 mL/min — ABNORMAL LOW (ref >60)
GLUCOSE: 201 mg/dL — AB (ref 65–99)
Potassium: 3.6 mmol/L (ref 3.5–5.1)
SODIUM: 139 mmol/L (ref 135–145)

## 2015-01-24 LAB — GLUCOSE, CAPILLARY
GLUCOSE-CAPILLARY: 198 mg/dL — AB (ref 65–99)
Glucose-Capillary: 209 mg/dL — ABNORMAL HIGH (ref 65–99)
Glucose-Capillary: 216 mg/dL — ABNORMAL HIGH (ref 65–99)
Glucose-Capillary: 79 mg/dL (ref 65–99)

## 2015-01-24 NOTE — Progress Notes (Signed)
Advanced Heart Failure Rounding Note  PCP: Dr Asa Lente Primary HF: Dr. Aundra Dubin   Subjective:    Continues to diurese well. Breathing better. Weight down another 13 pounds (21 pounds total). Renal function stable.  Objective:   Weight Range: 114.669 kg (252 lb 12.8 oz) Body mass index is 52.85 kg/(m^2).   Vital Signs:   Temp:  [98 F (36.7 C)-98.6 F (37 C)] 98.6 F (37 C) (12/03 0600) Pulse Rate:  [64-68] 68 (12/03 0600) Resp:  [17-18] 18 (12/03 0600) BP: (129-151)/(48-67) 151/51 mmHg (12/03 0600) SpO2:  [93 %-97 %] 97 % (12/03 0600) Weight:  [114.669 kg (252 lb 12.8 oz)] 114.669 kg (252 lb 12.8 oz) (12/03 0549) Last BM Date: 01/23/15  Weight change: Filed Weights   01/23/15 0435 01/23/15 1048 01/24/15 0549  Weight: 119.659 kg (263 lb 12.8 oz) 120.249 kg (265 lb 1.6 oz) 114.669 kg (252 lb 12.8 oz)    Intake/Output:   Intake/Output Summary (Last 24 hours) at 01/24/15 1103 Last data filed at 01/24/15 0946  Gross per 24 hour  Intake   1180 ml  Output   4152 ml  Net  -2972 ml     Physical Exam: General: NAD, obese. Sitting up on side of bed.  NAD Neck: Thick, JVP to jaw No thyromegaly or nodule noted Lungs: CTAB, normal effort. Chronic 2L oxygen CV: Nondisplaced PMI. No rubs or gallops  2/6 SEM RUSB. No carotid bruit.  Abdomen: obese, + distended, NT, no HSM, + BS Neurologic: Alert and oriented x 3.  Psych: Normal affect. Extremities: No clubbing/cyanosis, bilateral venous stasis. 1-2+ edema into thighs  + Unna boots  Telemetry: NSR  Labs: CBC  Recent Labs  01/22/15 1300 01/24/15 0301  WBC 10.6* 8.8  HGB 11.1* 10.6*  HCT 35.9* 35.0*  MCV 84.7 85.2  PLT 351 [id number]   Basic Metabolic Panel  Recent Labs  01/23/15 0827 01/24/15 0301  NA 139 139  K 3.6 3.6  CL 91* 95*  CO2 34* 34*  GLUCOSE 195* 201*  BUN 23* 25*  CREATININE 1.05* 1.10*  CALCIUM 9.1 8.9   Liver Function Tests No results for input(s): AST, ALT, ALKPHOS, BILITOT, PROT,  ALBUMIN in the last 72 hours. No results for input(s): LIPASE, AMYLASE in the last 72 hours. Cardiac Enzymes No results for input(s): CKTOTAL, CKMB, CKMBINDEX, TROPONINI in the last 72 hours.  BNP: BNP (last 3 results) No results for input(s): BNP in the last 8760 hours.  ProBNP (last 3 results) No results for input(s): PROBNP in the last 8760 hours.   D-Dimer No results for input(s): DDIMER in the last 72 hours. Hemoglobin A1C  Recent Labs  01/22/15 1300  HGBA1C 8.7*   Fasting Lipid Panel No results for input(s): CHOL, HDL, LDLCALC, TRIG, CHOLHDL, LDLDIRECT in the last 72 hours. Thyroid Function Tests No results for input(s): TSH, T4TOTAL, T3FREE, THYROIDAB in the last 72 hours.  Invalid input(s): FREET3  Other results:     Imaging/Studies:  No results found.  Latest Echo  Latest Cath   Medications:     Scheduled Medications: . aspirin EC  81 mg Oral Daily  . B-complex with vitamin C  1 tablet Oral Daily  . carvedilol  25 mg Oral BID WC  . ezetimibe  10 mg Oral Daily  . fenofibrate  160 mg Oral Daily  . gabapentin  600 mg Oral QID  . heparin subcutaneous  5,000 Units Subcutaneous 3 times per day  . hydrALAZINE  25  mg Oral 3 times per day  . insulin aspart  0-20 Units Subcutaneous TID WC  . insulin aspart  50 Units Subcutaneous Q supper  . insulin aspart  70 Units Subcutaneous Q lunch  . insulin aspart  70 Units Subcutaneous Q breakfast  . lactobacillus  1 g Oral TID WC  . magnesium oxide  400 mg Oral Daily  . omega-3 acid ethyl esters  1 g Oral BID  . potassium chloride SA  40 mEq Oral Daily  . rOPINIRole  2 mg Oral BID  . sertraline  25 mg Oral Daily  . sodium chloride  3 mL Intravenous Q12H  . spironolactone  25 mg Oral Daily  . vancomycin  1,500 mg Intravenous Q24H  . vitamin B-12  500 mcg Oral Daily    Infusions: . furosemide (LASIX) infusion 10 mg/hr (01/23/15 1517)    PRN Medications: sodium chloride, acetaminophen, hydrOXYzine,  ondansetron (ZOFRAN) IV, oxyCODONE, sodium chloride   Assessment/Plan   1. Acute on chronic systolic CHF:  - Weight coming down nicely but still overloaded. Will continue IV lasix one more day.  - Continue Lasix gtt at 10 mg/hr, - K stable - renal function stable - she continues to drink a lot of fluids at home. Reinforced need for daily weights and reviewed use of sliding scale diuretics. 2. Cellulitis with LE wounds:  - Recent hx of Strep cellulitis in 9/16.  - Continue vancomycin - WOC has seen. UNNA boots placed.  3. Morbid obesity 4. DM2  - continue insulin and SSI  Dispo: Possibly home Monday.   Length of Stay: 2  Glori Bickers MD 01/24/2015, 11:03 AM  Advanced Heart Failure Team Pager [phone] (M-F; [place])  Please contact [facility] for night-coverage after hours (4p -7a ) and weekends on [email or web address]

## 2015-01-25 LAB — BASIC METABOLIC PANEL
ANION GAP: 11 (ref 5–15)
BUN: 26 mg/dL — AB (ref 6–20)
CHLORIDE: 94 mmol/L — AB (ref 101–111)
CO2: 32 mmol/L (ref 22–32)
Calcium: 8.8 mg/dL — ABNORMAL LOW (ref 8.9–10.3)
Creatinine, Ser: 1.31 mg/dL — ABNORMAL HIGH (ref 0.44–1.00)
GFR calc Af Amer: 47 mL/min — ABNORMAL LOW (ref >60)
GFR calc non Af Amer: 41 mL/min — ABNORMAL LOW (ref >60)
GLUCOSE: 245 mg/dL — AB (ref 65–99)
POTASSIUM: 3.7 mmol/L (ref 3.5–5.1)
Sodium: 137 mmol/L (ref 135–145)

## 2015-01-25 LAB — GLUCOSE, CAPILLARY
GLUCOSE-CAPILLARY: 253 mg/dL — AB (ref 65–99)
GLUCOSE-CAPILLARY: 184 mg/dL — AB (ref 65–99)
GLUCOSE-CAPILLARY: 158 mg/dL — AB (ref 65–99)
GLUCOSE-CAPILLARY: 153 mg/dL — AB (ref 65–99)
Glucose-Capillary: 135 mg/dL — ABNORMAL HIGH (ref 65–99)
Glucose-Capillary: 112 mg/dL — ABNORMAL HIGH (ref 65–99)

## 2015-01-25 MED ORDER — TORSEMIDE 20 MG PO TABS
100.0000 mg | ORAL_TABLET | Freq: Two times a day (BID) | ORAL | Status: DC
  Administered 2015-01-25 – 2015-01-26 (×2): 100 mg via ORAL
  Filled 2015-01-25 (×2): qty 5

## 2015-01-25 MED ORDER — CEPHALEXIN 500 MG PO CAPS
500.0000 mg | ORAL_CAPSULE | Freq: Four times a day (QID) | ORAL | Status: DC
  Administered 2015-01-25 – 2015-01-26 (×5): 500 mg via ORAL
  Filled 2015-01-25 (×5): qty 1

## 2015-01-25 NOTE — Progress Notes (Signed)
Advanced Heart Failure Rounding Note  PCP: Dr Asa Lente Primary HF: Dr. Aundra Dubin   Subjective:    Diuresed 3.3L but weight up 3 pounds (? Accurate)  Breathing better.  Creatinine up slightly.   Objective:   Weight Range: 115.758 kg (255 lb 3.2 oz) Body mass index is 53.35 kg/(m^2).   Vital Signs:   Temp:  [98.1 F (36.7 C)-99.1 F (37.3 C)] 98.1 F (36.7 C) (12/04 0519) Pulse Rate:  [62-71] 71 (12/04 0519) Resp:  [18-20] 18 (12/04 0519) BP: (132-142)/(39-52) 132/42 mmHg (12/04 0519) SpO2:  [91 %-94 %] 92 % (12/04 0519) Weight:  [115.758 kg (255 lb 3.2 oz)] 115.758 kg (255 lb 3.2 oz) (12/04 0519) Last BM Date: 01/24/15  Weight change: Filed Weights   01/23/15 1048 01/24/15 0549 01/25/15 0519  Weight: 120.249 kg (265 lb 1.6 oz) 114.669 kg (252 lb 12.8 oz) 115.758 kg (255 lb 3.2 oz)    Intake/Output:   Intake/Output Summary (Last 24 hours) at 01/25/15 1021 Last data filed at 01/25/15 0901  Gross per 24 hour  Intake   2050 ml  Output   3300 ml  Net  -1250 ml     Physical Exam: General: NAD, obese. Lying flat in bed.  NAD Neck: Thick, JVP hard to see No thyromegaly or nodule noted Lungs: CTAB, normal effort. Chronic 2L oxygen CV: Nondisplaced PMI. No rubs or gallops  2/6 SEM RUSB. No carotid bruit.  Abdomen: obese, + mildly distended, NT, no HSM, + BS Neurologic: Alert and oriented x 3.  Psych: Normal affect. Extremities: No clubbing/cyanosis, bilateral venous stasis. trace edema into thighs  + Unna boots  Telemetry: NSR  Labs: CBC  Recent Labs  01/22/15 1300 01/24/15 0301  WBC 10.6* 8.8  HGB 11.1* 10.6*  HCT 35.9* 35.0*  MCV 84.7 85.2  PLT 351 [id number]   Basic Metabolic Panel  Recent Labs  01/24/15 0301 01/25/15 0429  NA 139 137  K 3.6 3.7  CL 95* 94*  CO2 34* 32  GLUCOSE 201* 245*  BUN 25* 26*  CREATININE 1.10* 1.31*  CALCIUM 8.9 8.8*   Liver Function Tests No results for input(s): AST, ALT, ALKPHOS, BILITOT, PROT, ALBUMIN in the  last 72 hours. No results for input(s): LIPASE, AMYLASE in the last 72 hours. Cardiac Enzymes No results for input(s): CKTOTAL, CKMB, CKMBINDEX, TROPONINI in the last 72 hours.  BNP: BNP (last 3 results) No results for input(s): BNP in the last 8760 hours.  ProBNP (last 3 results) No results for input(s): PROBNP in the last 8760 hours.   D-Dimer No results for input(s): DDIMER in the last 72 hours. Hemoglobin A1C  Recent Labs  01/22/15 1300  HGBA1C 8.7*   Fasting Lipid Panel No results for input(s): CHOL, HDL, LDLCALC, TRIG, CHOLHDL, LDLDIRECT in the last 72 hours. Thyroid Function Tests No results for input(s): TSH, T4TOTAL, T3FREE, THYROIDAB in the last 72 hours.  Invalid input(s): FREET3  Other results:     Imaging/Studies:  No results found.  Latest Echo  Latest Cath   Medications:     Scheduled Medications: . aspirin EC  81 mg Oral Daily  . B-complex with vitamin C  1 tablet Oral Daily  . carvedilol  25 mg Oral BID WC  . ezetimibe  10 mg Oral Daily  . fenofibrate  160 mg Oral Daily  . gabapentin  600 mg Oral QID  . heparin subcutaneous  5,000 Units Subcutaneous 3 times per day  . hydrALAZINE  25  mg Oral 3 times per day  . insulin aspart  0-20 Units Subcutaneous TID WC  . insulin aspart  50 Units Subcutaneous Q supper  . insulin aspart  70 Units Subcutaneous Q lunch  . insulin aspart  70 Units Subcutaneous Q breakfast  . lactobacillus  1 g Oral TID WC  . magnesium oxide  400 mg Oral Daily  . omega-3 acid ethyl esters  1 g Oral BID  . potassium chloride SA  40 mEq Oral Daily  . rOPINIRole  2 mg Oral BID  . sertraline  25 mg Oral Daily  . sodium chloride  3 mL Intravenous Q12H  . spironolactone  25 mg Oral Daily  . vancomycin  1,500 mg Intravenous Q24H  . vitamin B-12  500 mcg Oral Daily    Infusions: . furosemide (LASIX) infusion 10 mg/hr (01/23/15 1517)    PRN Medications: sodium chloride, acetaminophen, hydrOXYzine, ondansetron (ZOFRAN)  IV, oxyCODONE, sodium chloride   Assessment/Plan   1. Acute on chronic systolic CHF:  - Has diuresed well. Weight up today but suspect that may not be accurate. She is well below previous baseline. With creatinine trending up will stop lasix gtt and resume po diuretics.  - she continues to drink a lot of fluids at home. Reinforced need for daily weights and reviewed use of sliding scale diuretics. 2. Cellulitis with LE wounds:  - Recent hx of Strep cellulitis in 9/16.  - Will switch vancomycin to keflex (was sensitive to ceftriaxone) - WOC has seen. UNNA boots placed.  3. Morbid obesity 4. DM2  - continue insulin and SSI  Dispo: Possibly home tomorrow  Length of Stay: 3  Akiko Schexnider MD 01/25/2015, 10:21 AM  Advanced Heart Failure Team Pager [phone] (M-F; 7a - 4p)  Please contact [facility] for night-coverage after hours (4p -7a ) and weekends on [email or web address]

## 2015-01-25 NOTE — Progress Notes (Signed)
Inpatient Diabetes Program Recommendations  AACE/ADA: New Consensus Statement on Inpatient Glycemic Control (2015)  Target Ranges:  Prepandial:   less than 140 mg/dL      Peak postprandial:   less than 180 mg/dL (1-2 hours)      Critically ill patients:  140 - 180 mg/dL   Review of Glycemic Control  Diabetes history: DM2 Outpatient Diabetes medications: Humulin 70 - 70 - 50 units tidwc Current orders for Inpatient glycemic control: Same as above  Results for Melissa Myers, Melissa Myers (MRN [id number]) as of 01/25/2015 20:04  Ref. Range 01/25/2015 05:51 01/25/2015 11:32 01/25/2015 12:57 01/25/2015 16:11 01/25/2015 18:24  Glucose-Capillary Latest Ref Range: 65-99 mg/dL 253 (H) 184 (H) 135 (H) 112 (H) 158 (H)  Results for Melissa Myers, Melissa Myers (MRN [id number]) as of 01/25/2015 20:04  Ref. Range 01/22/2015 13:00  Hemoglobin A1C Latest Ref Range: 4.8-5.6 % 8.7 (H)  Results for Melissa Myers, Melissa Myers (MRN [id number]) as of 01/25/2015 20:04  Ref. Range 01/24/2015 03:01 01/25/2015 04:29  Glucose Latest Ref Range: 65-99 mg/dL 201 (H) 245 (H)   FBS elevated. Has hypos at home. Needs basal insulin.  Inpatient Diabetes Program Recommendations:    Consider addition of Lantus 20 units QHS.  Research recommends 50% basal 50% bolus insulin regimen. Would f/u with endo.  Thank you. Lorenda Peck, RD, LDN, CDE Inpatient Diabetes Coordinator [phone]

## 2015-01-26 LAB — BASIC METABOLIC PANEL
ANION GAP: 12 (ref 5–15)
BUN: 31 mg/dL — AB (ref 6–20)
CHLORIDE: 93 mmol/L — AB (ref 101–111)
CO2: 31 mmol/L (ref 22–32)
Calcium: 9.1 mg/dL (ref 8.9–10.3)
Creatinine, Ser: 1.36 mg/dL — ABNORMAL HIGH (ref 0.44–1.00)
GFR calc Af Amer: 45 mL/min — ABNORMAL LOW (ref >60)
GFR, EST NON AFRICAN AMERICAN: 39 mL/min — AB (ref >60)
GLUCOSE: 295 mg/dL — AB (ref 65–99)
POTASSIUM: 3.6 mmol/L (ref 3.5–5.1)
Sodium: 136 mmol/L (ref 135–145)

## 2015-01-26 LAB — GLUCOSE, CAPILLARY
GLUCOSE-CAPILLARY: 183 mg/dL — AB (ref 65–99)
Glucose-Capillary: 232 mg/dL — ABNORMAL HIGH (ref 65–99)
Glucose-Capillary: 245 mg/dL — ABNORMAL HIGH (ref 65–99)

## 2015-01-26 MED ORDER — CEPHALEXIN 500 MG PO CAPS: 500.0000 mg | ORAL_CAPSULE | Freq: Four times a day (QID) | ORAL | Status: DC

## 2015-01-26 NOTE — Progress Notes (Signed)
CARDIAC REHAB PHASE I   PRE:  Rate/Rhythm: 62 SR    BP: sitting 132/45    SaO2: 95 RA  MODE:  Ambulation: 40 ft   POST:  Rate/Rhythm: 120 ST    BP: sitting 142/52     SaO2: 95 RA  Pt c/o severe right foot pain with ambulation. Sts 6/10 at rest and 15/10 with walking. Labored walking with RW and gait belt, min assist. Encouraged pt to think past the pain, to focus on something else. She was able to do this and second half of walk was improved. To recliner. Discussed HF, daily wts, low sodium, fluid restrictions, walking daily. Voiced understanding. Sts her husband does the shopping and he has not been supportive of her. Encouraged her to go with him to the store in the w/c so they can make choices together. Encouraged pt to stay on top of caring for herself. Voiced understanding and able to do partial teach back. Needs reiteration of sodium restrictions. Suggest HHRN and PT/OT. [place], [place], ACSM 01/26/2015 10:34 AM

## 2015-01-26 NOTE — Care Management Note (Signed)
Case Management Note  Patient Details  Name: Melissa Myers MRN: [id number] Date of Birth: 02-10-1946  Subjective/Objective:      Admitted with CHF              Action/Plan: Patient lives at home with spouse and her mother. Patient could benefit from [facility] services for Disease Management for CHF. Patient chose [facility]. Tiffany with [facility] called for arrangements. Patient has home oxygen and a walker- no other DME needed at this time. Also HHRN for Unna boots.  Expected Discharge Date:   01/26/2015               Expected Discharge Plan:  [place]  Discharge planning Services  CM Consult    Choice offered to:  Patient  HH Arranged:  RN, Disease Management, PT, OT HH Agency:  [facility] [place]  Status of Service:  In process, will continue to follow  Sherrilyn Rist [id number] 01/26/2015, 10:42 AM

## 2015-01-26 NOTE — Consult Note (Signed)
   [facility] CM Inpatient Consult   01/26/2015  Melissa Myers [birth year] [id number] Patient was assessed for [facility] [facility] for community services. Patient was previously active with [facility] [place] [facility].  Spoke  With the patient regarding being restarted with [facility] services. Patient endorses that her primary care provider is now Pricilla Holm.  Consent form on file.  Of note, [facility] Care Management services does not replace or interfere with any services that are arranged by inpatient case management or social work. For additional questions or referrals please contact: Natividad Brood, RN BSN [facility] Liaison  [phone] business mobile phone

## 2015-01-26 NOTE — Discharge Instructions (Signed)
Heart Failure  Heart failure means your heart has trouble pumping blood. This makes it hard for your body to work well. Heart failure is usually a long-term (chronic) condition. You must take good care of yourself and follow your doctor's treatment plan.  HOME CARE   Take your heart medicine as told by your doctor.    Do not stop taking medicine unless your doctor tells you to.    Do not skip any dose of medicine.    Refill your medicines before they run out.    Take other medicines only as told by your doctor or pharmacist.   Stay active if told by your doctor. The elderly and people with severe heart failure should talk with a doctor about physical activity.   Eat heart-healthy foods. Choose foods that are without trans fat and are low in saturated fat, cholesterol, and salt (sodium). This includes fresh or frozen fruits and vegetables, fish, lean meats, fat-free or low-fat dairy foods, whole grains, and high-fiber foods. Lentils and dried peas and beans (legumes) are also good choices.   Limit salt if told by your doctor.   Cook in a healthy way. Roast, grill, broil, bake, poach, steam, or stir-fry foods.   Limit fluids as told by your doctor.   Weigh yourself every morning. Do this after you pee (urinate) and before you eat breakfast. Write down your weight to give to your doctor.   Take your blood pressure and write it down if your doctor tells you to.   Ask your doctor how to check your pulse. Check your pulse as told.   Lose weight if told by your doctor.   Stop smoking or chewing tobacco. Do not use gum or patches that help you quit without your doctor's approval.   Schedule and go to doctor visits as told.   Nonpregnant women should have no more than 1 drink a day. Men should have no more than 2 drinks a day. Talk to your doctor about drinking alcohol.   Stop illegal drug use.   Stay current with shots (immunizations).   Manage your health conditions as told by your doctor.   Learn to  manage your stress.   Rest when you are tired.   If it is really hot outside:    Avoid intense activities.    Use air conditioning or fans, or get in a cooler place.    Avoid caffeine and alcohol.    Wear loose-fitting, lightweight, and light-colored clothing.   If it is really cold outside:    Avoid intense activities.    Layer your clothing.    Wear mittens or gloves, a hat, and a scarf when going outside.    Avoid alcohol.   Learn about heart failure and get support as needed.   Get help to maintain or improve your quality of life and your ability to care for yourself as needed.  GET HELP IF:    You gain weight quickly.   You are more short of breath than usual.   You cannot do your normal activities.   You tire easily.   You cough more than normal, especially with activity.   You have any or more puffiness (swelling) in areas such as your hands, feet, ankles, or belly (abdomen).   You cannot sleep because it is hard to breathe.   You feel like your heart is beating fast (palpitations).   You get dizzy or light-headed when you stand up.  GET HELP   RIGHT AWAY IF:    You have trouble breathing.   There is a change in mental status, such as becoming less alert or not being able to focus.   You have chest pain or discomfort.   You faint.  MAKE SURE YOU:    Understand these instructions.   Will watch your condition.   Will get help right away if you are not doing well or get worse.     This information is not intended to replace advice given to you by your health care provider. Make sure you discuss any questions you have with your health care provider.     Document Released: 11/17/2007 Document Revised: 02/28/2014 Document Reviewed: 03/26/2012  Elsevier Interactive Patient Education 2016 [employer]

## 2015-01-26 NOTE — Progress Notes (Signed)
Pt a/o, being dc to home with husband, dc instructions, f/u appointments, and medications reviewed with pt, pt verbalized understanding, pt left via w/c with son

## 2015-01-26 NOTE — Care Management Important Message (Signed)
Important Message  Patient Details  Name: Melissa Myers MRN: [id number] Date of Birth: 05/24/1945   Medicare Important Message Given:  Yes    [name] P [employer] 01/26/2015, 11:45 AM

## 2015-01-26 NOTE — Progress Notes (Signed)
Patient ID: Melissa Myers, female   DOB: 1945/08/19, 69 y.o.   MRN: [id number]     Advanced Heart Failure Rounding Note  PCP: Dr Asa Lente Primary HF: Dr. Aundra Dubin   Subjective:    Switched to po torsemide yesterday.  Weight down 20 lbs.  Breathing better.  Not walking much due to right foot pain.    Objective:   Weight Range: 253 lb 8 oz (114.987 kg) Body mass index is 53 kg/(m^2).   Vital Signs:   Temp:  [98.1 F (36.7 C)-98.7 F (37.1 C)] 98.1 F (36.7 C) (12/05 0731) Pulse Rate:  [68-121] 121 (12/05 0731) Resp:  [16-18] 18 (12/05 0731) BP: (138-158)/(45-67) 138/45 mmHg (12/05 0731) SpO2:  [91 %-96 %] 94 % (12/05 0731) Weight:  [253 lb 8 oz (114.987 kg)-255 lb 2.9 oz (115.748 kg)] 253 lb 8 oz (114.987 kg) (12/05 0725) Last BM Date: 01/24/15  Weight change: Filed Weights   01/25/15 0519 01/26/15 0500 01/26/15 0725  Weight: 255 lb 3.2 oz (115.758 kg) 255 lb 2.9 oz (115.748 kg) 253 lb 8 oz (114.987 kg)    Intake/Output:   Intake/Output Summary (Last 24 hours) at 01/26/15 0831 Last data filed at 01/26/15 0806  Gross per 24 hour  Intake   1360 ml  Output   2725 ml  Net  -1365 ml     Physical Exam: General: NAD, obese. Lying flat in bed.  NAD Neck: Thick, JVP hard to see but does not appear elevated. No thyromegaly or nodule noted Lungs: CTAB, normal effort. Chronic 2L oxygen CV: Nondisplaced PMI. No rubs or gallops  2/6 SEM RUSB. No carotid bruit.  Abdomen: obese, + mildly distended, NT, no HSM, + BS Neurologic: Alert and oriented x 3.  Psych: Normal affect. Extremities: No clubbing/cyanosis, bilateral venous stasis. Unna boots bilaterally.  Telemetry: NSR  Labs: CBC  Recent Labs  01/24/15 0301  WBC 8.8  HGB 10.6*  HCT 35.0*  MCV 85.2  PLT [id number]   Basic Metabolic Panel  Recent Labs  01/24/15 0301 01/25/15 0429  NA 139 137  K 3.6 3.7  CL 95* 94*  CO2 34* 32  GLUCOSE 201* 245*  BUN 25* 26*  CREATININE 1.10* 1.31*  CALCIUM 8.9 8.8*    Liver Function Tests No results for input(s): AST, ALT, ALKPHOS, BILITOT, PROT, ALBUMIN in the last 72 hours. No results for input(s): LIPASE, AMYLASE in the last 72 hours. Cardiac Enzymes No results for input(s): CKTOTAL, CKMB, CKMBINDEX, TROPONINI in the last 72 hours.  BNP: BNP (last 3 results) No results for input(s): BNP in the last 8760 hours.  ProBNP (last 3 results) No results for input(s): PROBNP in the last 8760 hours.   D-Dimer No results for input(s): DDIMER in the last 72 hours. Hemoglobin A1C No results for input(s): HGBA1C in the last 72 hours. Fasting Lipid Panel No results for input(s): CHOL, HDL, LDLCALC, TRIG, CHOLHDL, LDLDIRECT in the last 72 hours. Thyroid Function Tests No results for input(s): TSH, T4TOTAL, T3FREE, THYROIDAB in the last 72 hours.  Invalid input(s): FREET3  Other results:     Imaging/Studies:  No results found.  Latest Echo  Latest Cath   Medications:     Scheduled Medications: . aspirin EC  81 mg Oral Daily  . B-complex with vitamin C  1 tablet Oral Daily  . carvedilol  25 mg Oral BID WC  . cephALEXin  500 mg Oral QID  . ezetimibe  10 mg Oral Daily  . fenofibrate  160 mg Oral Daily  . gabapentin  600 mg Oral QID  . heparin subcutaneous  5,000 Units Subcutaneous 3 times per day  . hydrALAZINE  25 mg Oral 3 times per day  . insulin aspart  0-20 Units Subcutaneous TID WC  . insulin aspart  50 Units Subcutaneous Q supper  . insulin aspart  70 Units Subcutaneous Q lunch  . insulin aspart  70 Units Subcutaneous Q breakfast  . lactobacillus  1 g Oral TID WC  . magnesium oxide  400 mg Oral Daily  . omega-3 acid ethyl esters  1 g Oral BID  . potassium chloride SA  40 mEq Oral Daily  . rOPINIRole  2 mg Oral BID  . sertraline  25 mg Oral Daily  . sodium chloride  3 mL Intravenous Q12H  . spironolactone  25 mg Oral Daily  . torsemide  100 mg Oral BID  . vitamin B-12  500 mcg Oral Daily    Infusions:    PRN  Medications: sodium chloride, acetaminophen, hydrOXYzine, ondansetron (ZOFRAN) IV, oxyCODONE, sodium chloride   Assessment/Plan   1. Acute on chronic diastolic CHF: Has diuresed well. Weight down 20 lbs total. She is well below previous baseline.  - Continue torsemide 100 mg bid, await BMET today.   - She continues to drink a lot of fluids at home. Reinforced need for daily weights and reviewed use of sliding scale diuretics. 2. Cellulitis with LE wounds: Recent hx of Strep cellulitis in 9/16. Switched vancomycin to keflex (was sensitive to ceftriaxone) - WOC has seen. UNNA boots placed.  3. Morbid obesity 4. DM2   Dispo: Home today. Follow CHF clinic in 10 days (can see Amy).  Continue Unna boots, needs home health.  Meds: Continue her prior to admission home cardiac meds (we discussed cutting back on fluid and sodium intake).  She will also go home on cephalexin 500 mg qid to complete 2 wks of antibiotics.   Length of Stay: 4  Loralie Champagne MD 01/26/2015, 8:31 AM  Advanced Heart Failure Team Pager [phone] (M-F; 7a - 4p)  Please contact [facility] for night-coverage after hours (4p -7a ) and weekends on [email or web address]

## 2015-01-26 NOTE — Discharge Summary (Signed)
Advanced Heart Failure Team  Discharge Summary   Patient ID: Melissa Myers MRN: [id number], DOB/AGE: 1945/10/19 69 y.o. Admit date: 01/22/2015 D/C date:     01/26/2015   Primary Discharge Diagnoses:  1. A/C Systolic Heart Failure  2. Cellulitis R and LLE 3. Morbid Obesity 4. DM2  Hospital Course:  Melissa Myers is a 69 year old with a history of HTN, DM, hyperlipidemia, OHS, OSA, obesity and diastolic heart failure.   Admitted from the HF clinic with lower extremity cellulitis and marked volume overload. Diuresed with lasix drip and transitioned back to home regimen torsemide 100 mg twice a day + metolazone twice a week. Overall she diuresed 20 pounds. Renal function was followed closely and remained stable. She continued on all HF meds that were noted prior to admit.   On admit she was placed on IV Vanc and as cellulitis improved vanc stopped keflex was started. She will continue keflex on discharge to complete a total of 14 days.   She will continue to be followed closely in the HF clinic. She has HF follow up next week. Plan to check BMET at that time.   Discharge Weight: 253 pounds.  Discharge Vitals: Blood pressure 138/45, pulse 121, temperature 98.1 F (36.7 C), temperature source Oral, resp. rate 18, height 4\' 10"  (1.473 m), weight 253 lb 8 oz (114.987 kg), SpO2 94 %.  Labs: Lab Results  Component Value Date   WBC 8.8 01/24/2015   HGB 10.6* 01/24/2015   HCT 35.0* 01/24/2015   MCV 85.2 01/24/2015   PLT 330 01/24/2015    Recent Labs Lab 01/26/15 0908  NA 136  K 3.6  CL 93*  CO2 31  BUN 31*  CREATININE 1.36*  CALCIUM 9.1  GLUCOSE 295*   Lab Results  Component Value Date   CHOL 196 12/23/2014   HDL 45 12/23/2014   LDLCALC 110* 12/23/2014   TRIG 204* 12/23/2014   BNP (last 3 results) No results for input(s): BNP in the last 8760 hours.  ProBNP (last 3 results) No results for input(s): PROBNP in the last 8760 hours.   Diagnostic Studies/Procedures    No results found.  Discharge Medications     Medication List    TAKE these medications        aspirin EC 81 MG tablet  Take 1 tablet (81 mg total) by mouth daily.     B-complex with vitamin C tablet  Take 1 tablet by mouth daily.     BD INSULIN SYRINGE ULTRAFINE 31G X 15/64" 1 ML Misc  Generic drug:  Insulin Syringe-Needle U-100  USE TO INJECT INSULIN 4 TIMES DAILY AS ADVISED.     carvedilol 25 MG tablet  Commonly known as:  COREG  TAKE 1 TABLET BY MOUTH TWICE DAILY WITH FOOD     cephALEXin 500 MG capsule  Commonly known as:  KEFLEX  Take 1 capsule (500 mg total) by mouth 4 (four) times daily.     diclofenac sodium 1 % Gel  Commonly known as:  VOLTAREN  Apply 2 g topically daily as needed (knee pain).     ezetimibe 10 MG tablet  Commonly known as:  ZETIA  Take 1 tablet (10 mg total) by mouth daily.     fenofibrate 160 MG tablet  TAKE 1 TABLET BY MOUTH DAILY     gabapentin 300 MG capsule  Commonly known as:  NEURONTIN  Take 600 mg by mouth 4 (four) times daily.     hydrALAZINE 25  MG tablet  Commonly known as:  APRESOLINE  TAKE 1 TABLET (25 MG TOTAL) BY MOUTH 3 (THREE) TIMES DAILY.     hydrOXYzine 10 MG tablet  Commonly known as:  ATARAX/VISTARIL  Take 10 mg by mouth 3 (three) times daily as needed for anxiety.     insulin regular 100 units/mL injection  Commonly known as:  HUMULIN R  70 units with breakfast, 70 units with lunch, and 50 units with the evening meal, and syringes 3/day     ketoconazole 2 % cream  Commonly known as:  NIZORAL  Apply 1 application topically daily.     lactobacillus Pack  Take 1 packet (1 g total) by mouth 3 (three) times daily with meals.     Magnesium 500 MG Tabs  Take 500 mg by mouth daily.     metolazone 5 MG tablet  Commonly known as:  ZAROXOLYN  TAKE 5mg  (1 tablet) every Wednesday and Saturday     omega-3 acid ethyl esters 1 G capsule  Commonly known as:  LOVAZA  Take 1 g by mouth 2 (two) times daily.      oxycodone 5 MG capsule  Commonly known as:  OXY-IR  Take 5 mg by mouth every 6 (six) hours.     potassium chloride SA 20 MEQ tablet  Commonly known as:  K-DUR,KLOR-CON  Take 2 tablets (40 mEq total) by mouth daily. Take an additional 36meq on Wednesay and Saturday with Metolazone.     rOPINIRole 2 MG tablet  Commonly known as:  REQUIP  Take 2 mg by mouth 2 (two) times daily.     spironolactone 25 MG tablet  Commonly known as:  ALDACTONE  Take 1 tablet (25 mg total) by mouth daily.     torsemide 100 MG tablet  Commonly known as:  DEMADEX  Take 1 tablet (100 mg total) by mouth 2 (two) times daily.     triamcinolone cream 0.1 %  Commonly known as:  KENALOG  Apply 1 application topically 2 (two) times daily as needed (rash).     vitamin B-12 500 MCG tablet  Commonly known as:  CYANOCOBALAMIN  Take 500 mcg by mouth daily.     Vitamin D3 2000 UNITS capsule  Take 2,000 Units by mouth 4 (four) times daily - after meals and at bedtime.        Disposition   The patient will be discharged in stable condition to home.     Discharge Instructions    Diet - low sodium heart healthy    Complete by:  As directed      Heart Failure patients record your daily weight using the same scale at the same time of day    Complete by:  As directed      Increase activity slowly    Complete by:  As directed           Follow-up Information    Follow up with Darrick Grinder, NP On 02/03/2015.   Specialty:  Cardiology   Why:  [facility] [place] information:   [address] [address] [phone]         Duration of Discharge Encounter: Greater than 35 minutes   Signed, Amy Clegg NP-C  01/26/2015, 9:47 AM

## 2015-01-27 ENCOUNTER — Telehealth (HOSPITAL_COMMUNITY): Payer: Self-pay

## 2015-01-27 DIAGNOSIS — E1122 Type 2 diabetes mellitus with diabetic chronic kidney disease: Secondary | ICD-10-CM | POA: Diagnosis not present

## 2015-01-27 DIAGNOSIS — E1142 Type 2 diabetes mellitus with diabetic polyneuropathy: Secondary | ICD-10-CM | POA: Diagnosis not present

## 2015-01-27 DIAGNOSIS — L03116 Cellulitis of left lower limb: Secondary | ICD-10-CM | POA: Diagnosis not present

## 2015-01-27 DIAGNOSIS — I130 Hypertensive heart and chronic kidney disease with heart failure and stage 1 through stage 4 chronic kidney disease, or unspecified chronic kidney disease: Secondary | ICD-10-CM | POA: Diagnosis not present

## 2015-01-27 DIAGNOSIS — M170 Bilateral primary osteoarthritis of knee: Secondary | ICD-10-CM | POA: Diagnosis not present

## 2015-01-27 DIAGNOSIS — I5042 Chronic combined systolic (congestive) and diastolic (congestive) heart failure: Secondary | ICD-10-CM | POA: Diagnosis not present

## 2015-01-27 DIAGNOSIS — L03115 Cellulitis of right lower limb: Secondary | ICD-10-CM | POA: Diagnosis not present

## 2015-01-27 DIAGNOSIS — M47816 Spondylosis without myelopathy or radiculopathy, lumbar region: Secondary | ICD-10-CM | POA: Diagnosis not present

## 2015-01-27 DIAGNOSIS — N186 End stage renal disease: Secondary | ICD-10-CM | POA: Diagnosis not present

## 2015-01-27 NOTE — Telephone Encounter (Signed)
Melissa Myers [facility] called CHF triage line to get specifics on UNA boot orders. 2 or 3 times per week? With or without calamine?  Will forward to provider to clarify.  Renee Pain

## 2015-01-27 NOTE — Telephone Encounter (Signed)
Per Darrick Grinder, NP 2 times a week with calamine, orders given to [facility]

## 2015-01-28 ENCOUNTER — Telehealth: Payer: Self-pay | Admitting: *Deleted

## 2015-01-28 DIAGNOSIS — I130 Hypertensive heart and chronic kidney disease with heart failure and stage 1 through stage 4 chronic kidney disease, or unspecified chronic kidney disease: Secondary | ICD-10-CM | POA: Diagnosis not present

## 2015-01-28 DIAGNOSIS — N186 End stage renal disease: Secondary | ICD-10-CM | POA: Diagnosis not present

## 2015-01-28 DIAGNOSIS — L03115 Cellulitis of right lower limb: Secondary | ICD-10-CM | POA: Diagnosis not present

## 2015-01-28 DIAGNOSIS — E1122 Type 2 diabetes mellitus with diabetic chronic kidney disease: Secondary | ICD-10-CM | POA: Diagnosis not present

## 2015-01-28 DIAGNOSIS — L03116 Cellulitis of left lower limb: Secondary | ICD-10-CM | POA: Diagnosis not present

## 2015-01-28 DIAGNOSIS — I5042 Chronic combined systolic (congestive) and diastolic (congestive) heart failure: Secondary | ICD-10-CM | POA: Diagnosis not present

## 2015-01-28 NOTE — Telephone Encounter (Signed)
Pt was on tcm list admitted for A/C Systolic Heart Failure D/C 01/27/15 pt will be f/u with cardiology HF Clinic on 12/13...Melissa Myers

## 2015-01-29 ENCOUNTER — Telehealth (HOSPITAL_COMMUNITY): Payer: Self-pay

## 2015-01-29 ENCOUNTER — Other Ambulatory Visit: Payer: Self-pay

## 2015-01-29 DIAGNOSIS — I130 Hypertensive heart and chronic kidney disease with heart failure and stage 1 through stage 4 chronic kidney disease, or unspecified chronic kidney disease: Secondary | ICD-10-CM | POA: Diagnosis not present

## 2015-01-29 DIAGNOSIS — I5042 Chronic combined systolic (congestive) and diastolic (congestive) heart failure: Secondary | ICD-10-CM | POA: Diagnosis not present

## 2015-01-29 DIAGNOSIS — L03116 Cellulitis of left lower limb: Secondary | ICD-10-CM | POA: Diagnosis not present

## 2015-01-29 DIAGNOSIS — E1122 Type 2 diabetes mellitus with diabetic chronic kidney disease: Secondary | ICD-10-CM | POA: Diagnosis not present

## 2015-01-29 DIAGNOSIS — L03115 Cellulitis of right lower limb: Secondary | ICD-10-CM | POA: Diagnosis not present

## 2015-01-29 DIAGNOSIS — N186 End stage renal disease: Secondary | ICD-10-CM | POA: Diagnosis not present

## 2015-01-29 NOTE — Telephone Encounter (Signed)
[facility] RN beth called to report patient weight at 259 lbs today.  Weight at discharge on the 5th was 253 lbs (6 lbs less).  Patient did not weigh the morning after she got home, so hard to tell if this is true weight gain vs. Difference in scales.  Nurse will go back out in the morning to reweigh patient and reassess.  Patient denies SOB, feels ok.  Nurse states patient does not have any additional swelling (had to change UNA boots).  Will wait to hear back from nurse in am to address.  Renee Pain

## 2015-01-29 NOTE — Patient Outreach (Signed)
[facility]) Care Management  01/29/2015  REEMA THRONE [birth year] [id number]  Assessment: Transition of care call. Member hospitalized 12/1-12/5 for heart failure. RNCM called to follow up/transition of care. Member reports home health advanced home care has been out to see member: nursing physical therapy. Member reports she has scales and is weighing. Member states her weight is up 6 pounds since discharge. Home health nurse has called her doctor today to report this information and she is awaiting a return call.  Member had to get off the phone abruptly stating that her mother was in the hospital and they were on the other line calling. Unable to complete medication review/transition of care call.  Plan: RNCM will continue to follow. Care coordination with home health as needed.    Thea Silversmith, RN, MSN, [place] Coordinator Cell: [phone]

## 2015-01-30 ENCOUNTER — Ambulatory Visit: Payer: Medicare Other | Admitting: Internal Medicine

## 2015-01-30 DIAGNOSIS — L03115 Cellulitis of right lower limb: Secondary | ICD-10-CM | POA: Diagnosis not present

## 2015-01-30 DIAGNOSIS — L03116 Cellulitis of left lower limb: Secondary | ICD-10-CM | POA: Diagnosis not present

## 2015-01-30 DIAGNOSIS — N186 End stage renal disease: Secondary | ICD-10-CM | POA: Diagnosis not present

## 2015-01-30 DIAGNOSIS — I130 Hypertensive heart and chronic kidney disease with heart failure and stage 1 through stage 4 chronic kidney disease, or unspecified chronic kidney disease: Secondary | ICD-10-CM | POA: Diagnosis not present

## 2015-01-30 DIAGNOSIS — E1122 Type 2 diabetes mellitus with diabetic chronic kidney disease: Secondary | ICD-10-CM | POA: Diagnosis not present

## 2015-01-30 DIAGNOSIS — I5042 Chronic combined systolic (congestive) and diastolic (congestive) heart failure: Secondary | ICD-10-CM | POA: Diagnosis not present

## 2015-02-01 ENCOUNTER — Other Ambulatory Visit (HOSPITAL_COMMUNITY): Payer: Self-pay | Admitting: Internal Medicine

## 2015-02-01 ENCOUNTER — Other Ambulatory Visit: Payer: Self-pay | Admitting: Internal Medicine

## 2015-02-02 ENCOUNTER — Other Ambulatory Visit: Payer: Self-pay

## 2015-02-02 DIAGNOSIS — N186 End stage renal disease: Secondary | ICD-10-CM | POA: Diagnosis not present

## 2015-02-02 DIAGNOSIS — E1122 Type 2 diabetes mellitus with diabetic chronic kidney disease: Secondary | ICD-10-CM | POA: Diagnosis not present

## 2015-02-02 DIAGNOSIS — L03115 Cellulitis of right lower limb: Secondary | ICD-10-CM | POA: Diagnosis not present

## 2015-02-02 DIAGNOSIS — I5042 Chronic combined systolic (congestive) and diastolic (congestive) heart failure: Secondary | ICD-10-CM | POA: Diagnosis not present

## 2015-02-02 DIAGNOSIS — L03116 Cellulitis of left lower limb: Secondary | ICD-10-CM | POA: Diagnosis not present

## 2015-02-02 DIAGNOSIS — I130 Hypertensive heart and chronic kidney disease with heart failure and stage 1 through stage 4 chronic kidney disease, or unspecified chronic kidney disease: Secondary | ICD-10-CM | POA: Diagnosis not present

## 2015-02-02 MED ORDER — FENOFIBRATE 160 MG PO TABS: 160.0000 mg | ORAL_TABLET | Freq: Every day | ORAL | Status: DC

## 2015-02-02 MED ORDER — DULOXETINE HCL 60 MG PO CPEP: 60.0000 mg | ORAL_CAPSULE | Freq: Every day | ORAL | Status: AC

## 2015-02-02 MED ORDER — ROPINIROLE HCL 2 MG PO TABS: 2.0000 mg | ORAL_TABLET | Freq: Two times a day (BID) | ORAL | Status: DC

## 2015-02-03 ENCOUNTER — Encounter: Payer: Self-pay | Admitting: Internal Medicine

## 2015-02-03 ENCOUNTER — Ambulatory Visit (INDEPENDENT_AMBULATORY_CARE_PROVIDER_SITE_OTHER): Payer: Medicare Other | Admitting: Internal Medicine

## 2015-02-03 ENCOUNTER — Ambulatory Visit (HOSPITAL_COMMUNITY)
Admit: 2015-02-03 | Discharge: 2015-02-03 | Disposition: A | Discharge status: Home / Self Care | Payer: Medicare Other | Source: Ambulatory Visit | Attending: Internal Medicine | Admitting: Internal Medicine

## 2015-02-03 VITALS — BP 128/66 | HR 68 | Temp 98.0°F

## 2015-02-03 DIAGNOSIS — E785 Hyperlipidemia, unspecified: Secondary | ICD-10-CM | POA: Diagnosis not present

## 2015-02-03 DIAGNOSIS — Z885 Allergy status to narcotic agent status: Secondary | ICD-10-CM | POA: Insufficient documentation

## 2015-02-03 DIAGNOSIS — E1142 Type 2 diabetes mellitus with diabetic polyneuropathy: Secondary | ICD-10-CM | POA: Insufficient documentation

## 2015-02-03 DIAGNOSIS — E669 Obesity, unspecified: Secondary | ICD-10-CM | POA: Diagnosis not present

## 2015-02-03 DIAGNOSIS — L03116 Cellulitis of left lower limb: Secondary | ICD-10-CM | POA: Diagnosis not present

## 2015-02-03 DIAGNOSIS — M545 Low back pain: Secondary | ICD-10-CM

## 2015-02-03 DIAGNOSIS — Z7982 Long term (current) use of aspirin: Secondary | ICD-10-CM | POA: Diagnosis not present

## 2015-02-03 DIAGNOSIS — I5032 Chronic diastolic (congestive) heart failure: Secondary | ICD-10-CM | POA: Diagnosis not present

## 2015-02-03 DIAGNOSIS — Z9981 Dependence on supplemental oxygen: Secondary | ICD-10-CM | POA: Insufficient documentation

## 2015-02-03 DIAGNOSIS — Z833 Family history of diabetes mellitus: Secondary | ICD-10-CM | POA: Insufficient documentation

## 2015-02-03 DIAGNOSIS — G2581 Restless legs syndrome: Secondary | ICD-10-CM

## 2015-02-03 DIAGNOSIS — I10 Essential (primary) hypertension: Secondary | ICD-10-CM | POA: Diagnosis not present

## 2015-02-03 DIAGNOSIS — Z882 Allergy status to sulfonamides status: Secondary | ICD-10-CM | POA: Insufficient documentation

## 2015-02-03 DIAGNOSIS — I110 Hypertensive heart disease with heart failure: Secondary | ICD-10-CM | POA: Insufficient documentation

## 2015-02-03 DIAGNOSIS — E781 Pure hyperglyceridemia: Secondary | ICD-10-CM | POA: Insufficient documentation

## 2015-02-03 DIAGNOSIS — G4733 Obstructive sleep apnea (adult) (pediatric): Secondary | ICD-10-CM | POA: Diagnosis not present

## 2015-02-03 DIAGNOSIS — Z794 Long term (current) use of insulin: Secondary | ICD-10-CM | POA: Insufficient documentation

## 2015-02-03 DIAGNOSIS — Z79899 Other long term (current) drug therapy: Secondary | ICD-10-CM | POA: Diagnosis not present

## 2015-02-03 DIAGNOSIS — L0390 Cellulitis, unspecified: Secondary | ICD-10-CM | POA: Diagnosis not present

## 2015-02-03 DIAGNOSIS — Z8249 Family history of ischemic heart disease and other diseases of the circulatory system: Secondary | ICD-10-CM | POA: Insufficient documentation

## 2015-02-03 DIAGNOSIS — G8929 Other chronic pain: Secondary | ICD-10-CM

## 2015-02-03 MED ORDER — ROPINIROLE HCL ER 4 MG PO TB24: 4.0000 mg | ORAL_TABLET | Freq: Every day | ORAL | Status: DC

## 2015-02-03 NOTE — Patient Instructions (Signed)
We have sent in the new prescription for the long acting requip medicine. Take it once a day in the evening. It is 4 mg and lasts all day. If this does not work better call and let us know.   Come back in about 3-6 months for a check up.

## 2015-02-03 NOTE — Progress Notes (Signed)
Advanced Heart Failure Medication Review by a Pharmacist  Does the patient  feel that his/her medications are working for him/her?  yes  Has the patient been experiencing any side effects to the medications prescribed?  no  Does the patient measure his/her own blood pressure or blood glucose at home?  yes   Does the patient have any problems obtaining medications due to transportation or finances?   no  Understanding of regimen: good Understanding of indications: good Potential of compliance: good Patient understands to avoid NSAIDs. Patient understands to avoid decongestants.  Issues to address at subsequent visits: None   Pharmacist comments:  Ms. Wilby is a pleasant 69 yo F presenting without a medication list but with good recall of her regimen including dosages. She reports excellent compliance with her medications and did not have any specific medication-related questions or concerns for me at this time.   [name]. Velva Harman, PharmD, BCPS, CPP Clinical Pharmacist Pager: [phone] Phone: [phone] 02/03/2015 10:46 AM      Time with patient: 6 minutes Preparation and documentation time: 2 minutes Total time: 8 minutes

## 2015-02-03 NOTE — Progress Notes (Signed)
Pre visit review using our clinic review tool, if applicable. No additional management support is needed unless otherwise documented below in the visit note. 

## 2015-02-03 NOTE — Patient Instructions (Signed)
Doing well!   Your physician recommends that you schedule a follow-up appointment in: 4 weeks  Do the following things EVERYDAY: 1) Weigh yourself in the morning before breakfast. Write it down and keep it in a log. 2) Take your medicines as prescribed 3) Eat low salt foods-Limit salt (sodium) to 2000 mg per day.  4) Stay as active as you can everyday 5) Limit all fluids for the day to less than 2 liters 6)

## 2015-02-03 NOTE — Progress Notes (Signed)
Patient ID: Melissa Myers, female   DOB: 06-30-1945, 69 y.o.   MRN: [id number]   PCP: Dr. Asa Lente Endocrinologist: Dr Loanne Drilling Primary HF: Dr Aundra Dubin.   69 yo with history of HTN, DM, hyperlipidemia, OHS/OSA, and chronic dyspnea/diastolic CHF.  Patient had an echo in 04/2009 showing moderate LVH and preserved LV systolic function. However, there was a very large LV mid-cavity gradient with valsalva.  Patient developed quite significant exertional dyspnea to the point where she was short of breath walking around her house. She had a pulmonary evaluation with Dr. Elsworth Soho but no primary lung problems were identified. Patient had an ETT-myoview in 05/2009 which was negative for ischemia or infarction. Right heart cath in 05/2009 showed mildly elevated right heart filling pressures but normal PA pressure and normal PCWP. She was started her on a beta blocker (Coreg) to try to lower her LV mid-cavity gradient (that likely occurs with exertion) and to better control BP.  V/Q scan was negative for PE.  PFTs from 06/2011 showed a restrictive defect. Last echo in 11/2013 showed EF 65% with normal RV size and systolic function.      Admitted 12/05/13 with volume overload. Diuresed with IV lasix and transitioned to torsemide 80 mg twice a day + metolazone. Overall she diuresed 21 pounds. Discharge weight was 263 pounds.   She was admitted in 9/16 with Strep agalactiae sepsis likely from cellulitis.    Admitted 12/1 though 12/5 with marked volume overload and cellulitis. Diuresed with Lasix drip and placed on antibiotics. Overall diuresed 20 pounds.   She returns for post hospital follow up for heart failure. Overall feeling much better. Weight at home 254 pounds. Denies SOB/PND/Orthopnea.  Decreased leg edema.  Limited activity. Drinking < 2 liters.  Taking all medications. [facility] following for RN and PT.    Labs (10/13): K 4.1, creatinine 0.85 Labs (4/14): K 3.7, creatinine 1.2, BUN 43, BNP 23 Labs (5/14): K 4.1,  creatinine 1.0 Labs (7/14): K 4.6 Creatinine 1.0 BNP 39.0  Labs (8/14): K 3.5, creatinine 0.91, BNP 71 Labs (11/14): K 3.7, creatinine 0.9 Labs (12/14): K 4.1, creatinine 0.8, BNP 52 Labs (3/15): K 4, creatinine 0.9 Labs (4/15): K 3.7, creatinine 0.9 Labs (5/15): K 3.8, creatinine 0.9, BNP 29 Labs (6/15): K 3.4, BUN 105, creatinine 0.9 =>1.7 Labs (6/17/5) K 4.2, BUN 41, Creatinine 0.80 Labs (08/19/13) K 3.2, BUN 71, creatinine 1.5 Labs 09/05/13 K 4.1 Creatinine 1.1 Labs 11/07/13 K 3.6 Creatinine 0.91 Labs 12/11/13 K 4.5 Creatine 1.05 Labs (12/15): K 3.8, creatinine 1.03 Labs (03/17/2014): K 4.1 Creatinine 0.99  Labs (4/16): K 3.7, creatinine 1.24 Labs (5/16): LDL 108, TGs 699 Labs (07/24/2014) K 3.5 Creatinine 1.17  Labs (07/31/2014) K 3.3 Creatinine 1.13 Labs (9/16): K 3.5, creatinine 0.93 Labs (01/26/2015) K 3.6 Creatinine 1.36   Allergies (verified):  1) ! Sulfa  2) Morphine   Past Medical History:  1. Diabetes mellitus, type II  2. Hyperlipidemia: She has been unable to tolerate statins (has been on Vytorin, lovastatin, and Crestor) due to what sounds like rhabdo: developed muscle weakness and was told she had "muscle damage" with each of these statins. She has been told not to take statins anymore.  Has hypertriglyceridemia.  3. Hypertension 4. Obesity  5. Restless Leg Syndrome  6. OA  7. peripheral neuropathy  8. OHS/OSA: Uses supplemental oxygen, does not tolerate CPAP.   9. Chronic nausea/diarrhea: ? IBS  10. Diastolic CHF: Echo ([id number]) with normal LV size, moderate LVH, EF 65-70%,  LV mid-cavity gradient reaching 63 mmHg with valsalva but minimal at rest, grade I diastolic dysfunction, cannot estimate PA systolic pressure (no TR doppler signal), RV normal. RHC (4/11): Mean RA 11, RV 37/13, PA 33/15, mean PCWP 12, CI 3.3. Echo in 11/12 showed mild LVH, EF 65-70%, no LV mid-cavity gradient was mentioned.  Echo in 4/14 showed EF 65-70%, mild LVH, grade I diastolic dysfunction, PA  systolic pressure 35 mmHg, mild LV mid-cavity gradient.  Echo (5/15) with EF 60-65%, mild TR, normal RV size and systolic function. Echo (10/15) with EF 65%, moderate LVH, normal RV.  11. ETT-myoview (4/11): 5'30", stopped due to fatigue, normal EF, no evidence for scar or ischemia. 12. V/Q scan 11/12: negative for PE.  Lower extremity venous doppler US (3/14): negative for DVT.  13. PFTs (5/13): FVC 50%, FEV1 62%, ratio 106%, DLCO 69%, TLC 69%.   Family History:  Family History of Alcoholism/Addiction (parent)  Family History Diabetes 1st degree relative (grandparent)  Family History High cholesterol (parent, grandparent)  Family History Hypertension (parent, grandparent)  Family History Lung cancer (grandparent)  Stomach cancer (grandmother)  Celiac Sprue daughter  Mother with MI at 60, father with MI at 55, uncle with MI at 75, brother with stents in his 74s, multiple aunts with MIs and CVAs   Social History:  Never Smoked  no alcohol  married, lives with spouse and her mother  retired Network engineer - now housewife  Alcohol Use - no  Illicit Drug Use - no  Review of Systems  All systems reviewed and negative except as per HPI.   ROS: All systems reviewed and negative except as per HPI.   Current Outpatient Prescriptions  Medication Sig Dispense Refill  . aspirin EC 81 MG tablet Take 1 tablet (81 mg total) by mouth daily.    . B Complex-C (B-COMPLEX WITH VITAMIN C) tablet Take 1 tablet by mouth daily.    . BD INSULIN SYRINGE ULTRAFINE 31G X 15/64" 1 ML MISC USE TO INJECT INSULIN 4 TIMES DAILY AS ADVISED. 100 each 4  . carvedilol (COREG) 25 MG tablet TAKE 1 TABLET BY MOUTH TWICE DAILY WITH FOOD 60 tablet 5  . cephALEXin (KEFLEX) 500 MG capsule Take 1 capsule (500 mg total) by mouth 4 (four) times daily. 36 capsule 0  . Cholecalciferol (VITAMIN D3) 2000 UNITS capsule Take 2,000 Units by mouth 4 (four) times daily - after meals and at bedtime.     . diclofenac sodium (VOLTAREN) 1 % GEL  Apply 2 g topically daily as needed (knee pain). 10 g 3  . DULoxetine (CYMBALTA) 60 MG capsule Take 1 capsule (60 mg total) by mouth daily. 90 capsule 1  . ezetimibe (ZETIA) 10 MG tablet Take 1 tablet (10 mg total) by mouth daily. 90 tablet 3  . fenofibrate 160 MG tablet Take 1 tablet (160 mg total) by mouth daily. Must establish with NEW PCP for additional refills. 30 tablet 2  . gabapentin (NEURONTIN) 300 MG capsule Take 600 mg by mouth 4 (four) times daily.    . hydrALAZINE (APRESOLINE) 25 MG tablet TAKE 1 TABLET (25 MG TOTAL) BY MOUTH 3 (THREE) TIMES DAILY. 90 tablet 2  . hydrOXYzine (ATARAX/VISTARIL) 10 MG tablet Take 10 mg by mouth 3 (three) times daily as needed for anxiety.    . insulin regular (HUMULIN R) 100 units/mL injection 70 units with breakfast, 70 units with lunch, and 50 units with the evening meal, and syringes 3/day (Patient taking differently: Inject 50-70 Units into  the skin 3 (three) times daily before meals. 70 units with breakfast, 70 units with lunch, and 50 units with the evening meal, and syringes 3/day) 60 mL 11  . ketoconazole (NIZORAL) 2 % cream Apply 1 application topically daily. (Patient taking differently: Apply 1 application topically daily. Apply to rash under breast and private areas) 60 g 1  . lactobacillus (FLORANEX/LACTINEX) PACK Take 1 packet (1 g total) by mouth 3 (three) times daily with meals. 30 packet 0  . Magnesium 500 MG TABS Take 500 mg by mouth daily.    . metolazone (ZAROXOLYN) 5 MG tablet TAKE 5mg  (1 tablet) every Wednesday and Saturday 15 tablet 3  . omega-3 acid ethyl esters (LOVAZA) 1 G capsule Take 1 g by mouth 2 (two) times daily.     [name] oxycodone (OXY-IR) 5 MG capsule Take 5 mg by mouth every 6 (six) hours.    . potassium chloride SA (K-DUR,KLOR-CON) 20 MEQ tablet Take 2 tablets (40 mEq total) by mouth daily. Take an additional 49meq on Wednesay and Saturday with Metolazone. 90 tablet 3  . rOPINIRole (REQUIP) 2 MG tablet Take 1 tablet (2 mg  total) by mouth 2 (two) times daily. Must establish with NEW PCP for additional refills. 60 tablet 2  . spironolactone (ALDACTONE) 25 MG tablet Take 1 tablet (25 mg total) by mouth daily. 90 tablet 3  . torsemide (DEMADEX) 100 MG tablet Take 1 tablet (100 mg total) by mouth 2 (two) times daily. 60 tablet 6  . triamcinolone cream (KENALOG) 0.1 % Apply 1 application topically 2 (two) times daily as needed (rash).    . vitamin B-12 (CYANOCOBALAMIN) 500 MCG tablet Take 500 mcg by mouth daily.     No current facility-administered medications for this encounter.   Filed Vitals:   02/03/15 1032  BP: 142/56  Pulse: 63  Weight: 254 lb 12.8 oz (115.577 kg)  SpO2: 94%    General: NAD, obese. Husband present . Arrived in wheelchair.   Neck: Thick, JVP difficult to assess but does not appear elevated.  No thyromegaly or thyroid nodule.  Lungs: Clear . Chronic 2L oxygen CV: Nondisplaced PMI.  Heart regular S1/S2, no S3/S4, 2/6 SEM RUSB.    No carotid bruit.  Abdomen: obese, + distended, nontender, no hepatosplenomegaly Neurologic: Alert and oriented x 3.  Psych: Normal affect. Extremities: No clubbing/cyanosis, bilateral venous stasis. R and LLE 1 + edema with chronic hyperpigmentation.     Assessment/Plan: 1. Chronic diastolic CHF:   NYHA III. Volume status stable. Continue torsemide 100 mg twice a day + metolazone 5 mg twice a week.  Reinforced daily weight , low salt food choices, and limiting fluid intake to < 2 liters per day.   2. OHS:  Followed by Dr Elsworth Soho. Per Dr Elsworth Soho no role for CPAP yet. Continue home oxygen (at all times). 3. Obesity: Discussed limiting portions.  4. HTN: . Continue current regimen.   5. Hyperlipidemia: Very high triglycerides. Lovaza added and she will continue Zetia and Fenofibrate.  6. Cellulitis-  Completing 14 day antibiotic course.    Follow up in 3 weeks.        Melissa Renwick  NP-C  10:47 AM

## 2015-02-04 DIAGNOSIS — E1122 Type 2 diabetes mellitus with diabetic chronic kidney disease: Secondary | ICD-10-CM | POA: Diagnosis not present

## 2015-02-04 DIAGNOSIS — I5042 Chronic combined systolic (congestive) and diastolic (congestive) heart failure: Secondary | ICD-10-CM | POA: Diagnosis not present

## 2015-02-04 DIAGNOSIS — I130 Hypertensive heart and chronic kidney disease with heart failure and stage 1 through stage 4 chronic kidney disease, or unspecified chronic kidney disease: Secondary | ICD-10-CM | POA: Diagnosis not present

## 2015-02-04 DIAGNOSIS — L03115 Cellulitis of right lower limb: Secondary | ICD-10-CM | POA: Diagnosis not present

## 2015-02-04 DIAGNOSIS — N186 End stage renal disease: Secondary | ICD-10-CM | POA: Diagnosis not present

## 2015-02-04 DIAGNOSIS — L03116 Cellulitis of left lower limb: Secondary | ICD-10-CM | POA: Diagnosis not present

## 2015-02-05 ENCOUNTER — Other Ambulatory Visit: Payer: Self-pay

## 2015-02-05 NOTE — Assessment & Plan Note (Signed)
She is aware that this is hurting her health and causing some of her arthralgias. She is not ready to commit to change today.

## 2015-02-05 NOTE — Progress Notes (Signed)
   Subjective:    Patient ID: Melissa Myers, female    DOB: 1945/07/30, 69 y.o.   MRN: [id number]  HPI The patient is a 69 YO female coming in for her restless leg syndrome. She has struggled with it for many years. She is currently taking requip twice a day for it. She does have symptoms during the day and night. The medicine helps but wears off too early. This does cause severe pain (8/10). Sometimes wakes her from sleep.   Review of Systems  Constitutional: Negative for fever, activity change, appetite change, fatigue and unexpected weight change.  Respiratory: Negative for cough, chest tightness, shortness of breath and wheezing.   Cardiovascular: Negative for chest pain, palpitations and leg swelling.  Gastrointestinal: Negative for nausea, abdominal pain, diarrhea, constipation and abdominal distention.  Musculoskeletal: Positive for myalgias and arthralgias.  Neurological: Negative.   Psychiatric/Behavioral: Negative.       Objective:   Physical Exam  Constitutional: She is oriented to person, place, and time. She appears well-developed and well-nourished.  HENT:  Head: Normocephalic and atraumatic.  Eyes: EOM are normal.  Neck: Normal range of motion.  Cardiovascular: Normal rate and regular rhythm.   Pulmonary/Chest: Effort normal and breath sounds normal. No respiratory distress. She has no wheezes. She has no rales.  Abdominal: Soft. Bowel sounds are normal. She exhibits no distension. There is no tenderness. There is no rebound.  Musculoskeletal: She exhibits no edema.  Neurological: She is alert and oriented to person, place, and time. Coordination normal.  Skin: Skin is warm and dry.  Psychiatric: She has a normal mood and affect.   Filed Vitals:   02/03/15 1608  BP: 128/66  Pulse: 68  Temp: 98 F (36.7 C)  SpO2: 96%      Assessment & Plan:

## 2015-02-05 NOTE — Assessment & Plan Note (Signed)
Does go to pain management for her controlled substances.

## 2015-02-05 NOTE — Assessment & Plan Note (Signed)
Healing and she is still with skin color changes due to venous stasis.

## 2015-02-05 NOTE — Assessment & Plan Note (Signed)
Will change to long acting form of requip 4 mg XL to see if this better covers her symptoms. She will call back with if this is helping.

## 2015-02-05 NOTE — Patient Outreach (Signed)
[name] Care Management  02/05/2015  Melissa Myers [birth year]   Assessment: RNCM called for transition of care call. Member reports she is feeling well. Denies any problems at this time. Medications reviewed. Member has had follow up appointment with primary care. Without questions or problems. Member reports she is still active with home health.  Plan: home visit next week.  Melissa Silversmith, RN, MSN, [place] Coordinator Cell: [phone]

## 2015-02-06 DIAGNOSIS — I130 Hypertensive heart and chronic kidney disease with heart failure and stage 1 through stage 4 chronic kidney disease, or unspecified chronic kidney disease: Secondary | ICD-10-CM | POA: Diagnosis not present

## 2015-02-06 DIAGNOSIS — E1122 Type 2 diabetes mellitus with diabetic chronic kidney disease: Secondary | ICD-10-CM | POA: Diagnosis not present

## 2015-02-06 DIAGNOSIS — N186 End stage renal disease: Secondary | ICD-10-CM | POA: Diagnosis not present

## 2015-02-06 DIAGNOSIS — I5042 Chronic combined systolic (congestive) and diastolic (congestive) heart failure: Secondary | ICD-10-CM | POA: Diagnosis not present

## 2015-02-06 DIAGNOSIS — L03115 Cellulitis of right lower limb: Secondary | ICD-10-CM | POA: Diagnosis not present

## 2015-02-06 DIAGNOSIS — L03116 Cellulitis of left lower limb: Secondary | ICD-10-CM | POA: Diagnosis not present

## 2015-02-09 DIAGNOSIS — I130 Hypertensive heart and chronic kidney disease with heart failure and stage 1 through stage 4 chronic kidney disease, or unspecified chronic kidney disease: Secondary | ICD-10-CM | POA: Diagnosis not present

## 2015-02-09 DIAGNOSIS — L03116 Cellulitis of left lower limb: Secondary | ICD-10-CM | POA: Diagnosis not present

## 2015-02-09 DIAGNOSIS — E1122 Type 2 diabetes mellitus with diabetic chronic kidney disease: Secondary | ICD-10-CM | POA: Diagnosis not present

## 2015-02-09 DIAGNOSIS — I5042 Chronic combined systolic (congestive) and diastolic (congestive) heart failure: Secondary | ICD-10-CM | POA: Diagnosis not present

## 2015-02-09 DIAGNOSIS — L03115 Cellulitis of right lower limb: Secondary | ICD-10-CM | POA: Diagnosis not present

## 2015-02-09 DIAGNOSIS — N186 End stage renal disease: Secondary | ICD-10-CM | POA: Diagnosis not present

## 2015-02-10 ENCOUNTER — Other Ambulatory Visit: Payer: Self-pay

## 2015-02-10 ENCOUNTER — Other Ambulatory Visit (HOSPITAL_COMMUNITY): Payer: Self-pay | Admitting: Cardiology

## 2015-02-10 ENCOUNTER — Telehealth (HOSPITAL_COMMUNITY): Payer: Self-pay | Admitting: *Deleted

## 2015-02-10 ENCOUNTER — Other Ambulatory Visit (HOSPITAL_COMMUNITY): Payer: Self-pay | Admitting: *Deleted

## 2015-02-10 DIAGNOSIS — I130 Hypertensive heart and chronic kidney disease with heart failure and stage 1 through stage 4 chronic kidney disease, or unspecified chronic kidney disease: Secondary | ICD-10-CM | POA: Diagnosis not present

## 2015-02-10 DIAGNOSIS — L03116 Cellulitis of left lower limb: Secondary | ICD-10-CM | POA: Diagnosis not present

## 2015-02-10 DIAGNOSIS — L03115 Cellulitis of right lower limb: Secondary | ICD-10-CM | POA: Diagnosis not present

## 2015-02-10 DIAGNOSIS — E1122 Type 2 diabetes mellitus with diabetic chronic kidney disease: Secondary | ICD-10-CM | POA: Diagnosis not present

## 2015-02-10 DIAGNOSIS — N186 End stage renal disease: Secondary | ICD-10-CM | POA: Diagnosis not present

## 2015-02-10 DIAGNOSIS — I5042 Chronic combined systolic (congestive) and diastolic (congestive) heart failure: Secondary | ICD-10-CM | POA: Diagnosis not present

## 2015-02-10 NOTE — Telephone Encounter (Signed)
[facility]N called stated pt had some confusion with medication dosage.  Pt has two torsemide bottles one 20mg  and the other 100mg .  She was taking 4 tablets twice a day but doesn't know which bottle she is taking. She is supposed to take 100mg  bid. Pt also not taking prescribed Potassium but an over the counter supplement. She has been referred to paramedicine per Dr.McLean.  Also needs to come in for bmet. Called pt twice will try again

## 2015-02-11 DIAGNOSIS — L03115 Cellulitis of right lower limb: Secondary | ICD-10-CM | POA: Diagnosis not present

## 2015-02-11 DIAGNOSIS — L03116 Cellulitis of left lower limb: Secondary | ICD-10-CM | POA: Diagnosis not present

## 2015-02-11 DIAGNOSIS — N186 End stage renal disease: Secondary | ICD-10-CM | POA: Diagnosis not present

## 2015-02-11 DIAGNOSIS — I130 Hypertensive heart and chronic kidney disease with heart failure and stage 1 through stage 4 chronic kidney disease, or unspecified chronic kidney disease: Secondary | ICD-10-CM | POA: Diagnosis not present

## 2015-02-11 DIAGNOSIS — I5042 Chronic combined systolic (congestive) and diastolic (congestive) heart failure: Secondary | ICD-10-CM | POA: Diagnosis not present

## 2015-02-11 DIAGNOSIS — E1122 Type 2 diabetes mellitus with diabetic chronic kidney disease: Secondary | ICD-10-CM | POA: Diagnosis not present

## 2015-02-11 NOTE — Patient Outreach (Signed)
[facility]) Care Management  [place]  02/10/15   Melissa Myers [birth year] [id number]  Subjective: Member denies shortness of breath, reports she is weighing herself, states "they are giving me all this diuretic but I am not peeing a lot."  Objective: BP 130/62 mmHg  Pulse 65  Resp 20  Ht 1.499 m (4\' 11" )  Wt 256 lb (116.121 kg)  BMI 51.68 kg/m2  SpO2 94%, lungs clear, bilateral legs/feet wrapped, bilateral toes puffy.    Current Medications:  Current Outpatient Prescriptions  Medication Sig Dispense Refill  . aspirin EC 81 MG tablet Take 1 tablet (81 mg total) by mouth daily.    . B Complex-C (B-COMPLEX WITH VITAMIN C) tablet Take 1 tablet by mouth daily.    . BD INSULIN SYRINGE ULTRAFINE 31G X 15/64" 1 ML MISC USE TO INJECT INSULIN 4 TIMES DAILY AS ADVISED. 100 each 4  . carvedilol (COREG) 25 MG tablet TAKE 1 TABLET BY MOUTH TWICE DAILY WITH FOOD 60 tablet 5  . cephALEXin (KEFLEX) 500 MG capsule Take 1 capsule (500 mg total) by mouth 4 (four) times daily. 36 capsule 0  . Cholecalciferol (VITAMIN D3) 2000 UNITS capsule Take 2,000 Units by mouth 4 (four) times daily - after meals and at bedtime.     . diclofenac sodium (VOLTAREN) 1 % GEL Apply 2 g topically daily as needed (knee pain). 10 g 3  . DULoxetine (CYMBALTA) 60 MG capsule Take 1 capsule (60 mg total) by mouth daily. 90 capsule 1  . ezetimibe (ZETIA) 10 MG tablet Take 1 tablet (10 mg total) by mouth daily. 90 tablet 3  . fenofibrate 160 MG tablet Take 1 tablet (160 mg total) by mouth daily. Must establish with NEW PCP for additional refills. 30 tablet 2  . gabapentin (NEURONTIN) 300 MG capsule Take 600 mg by mouth 4 (four) times daily.    . hydrALAZINE (APRESOLINE) 25 MG tablet TAKE 1 TABLET (25 MG TOTAL) BY MOUTH 3 (THREE) TIMES DAILY. 90 tablet 2  . insulin regular (HUMULIN R) 100 units/mL injection 70 units with breakfast, 70 units with lunch, and 50 units with the evening meal, and  syringes 3/day 60 mL 11  . ketoconazole (NIZORAL) 2 % cream Apply 1 application topically daily. Apply to rash under breasts and private areas    . lactobacillus (FLORANEX/LACTINEX) PACK Take 1 packet (1 g total) by mouth 3 (three) times daily with meals. 30 packet 0  . Magnesium 500 MG TABS Take 500 mg by mouth daily.    . metolazone (ZAROXOLYN) 5 MG tablet TAKE 5mg  (1 tablet) every Wednesday and Saturday 15 tablet 3  . omega-3 acid ethyl esters (LOVAZA) 1 G capsule Take 1 g by mouth 2 (two) times daily.     [name] oxycodone (OXY-IR) 5 MG capsule Take 5 mg by mouth every 6 (six) hours.    [name] rOPINIRole (REQUIP) 2 MG tablet Take 1 tablet (2 mg total) by mouth 2 (two) times daily. Must establish with NEW PCP for additional refills. 60 tablet 2  . spironolactone (ALDACTONE) 25 MG tablet Take 1 tablet (25 mg total) by mouth daily. 90 tablet 3  . torsemide (DEMADEX) 100 MG tablet Take 1 tablet (100 mg total) by mouth 2 (two) times daily. 60 tablet 6  . triamcinolone cream (KENALOG) 0.1 % Apply 1 application topically 2 (two) times daily as needed (rash).    . vitamin B-12 (CYANOCOBALAMIN) 500 MCG tablet Take 500 mcg by mouth daily.    [name]  hydrOXYzine (ATARAX/VISTARIL) 10 MG tablet Take 10 mg by mouth 3 (three) times daily as needed for anxiety. Reported on 02/10/2015    . potassium chloride SA (K-DUR,KLOR-CON) 20 MEQ tablet Take 2 tablets (40 mEq total) by mouth daily. Take an additional 38meq on Wednesay and Saturday with Metolazone. (Patient not taking: Reported on 02/10/2015) 90 tablet 3  . rOPINIRole (REQUIP XL) 4 MG 24 hr tablet Take 1 tablet (4 mg total) by mouth at bedtime. (Patient not taking: Reported on 02/10/2015) 30 tablet 3   No current facility-administered medications for this visit.    Functional Status:  In your present state of health, do you have any difficulty performing the following activities: 02/10/2015 01/25/2015  Hearing? N -  Vision? N -  Difficulty concentrating or making  decisions? Y -  Walking or climbing stairs? Y -  Dressing or bathing? N -  Doing errands, shopping? Y N  Preparing Food and eating ? N -  Using the Toilet? N -  In the past six months, have you accidently leaked urine? Y -  Do you have problems with loss of bowel control? N -  Managing your Medications? N -  Managing your Finances? N -  Housekeeping or managing your Housekeeping? Y -    Fall/Depression Screening: PHQ 2/9 Scores 02/10/2015 12/02/2014 08/20/2014 08/15/2014 07/08/2014 09/10/2013 01/16/2013  PHQ - 2 Score 0 0 0 0 1 0 2  PHQ- 9 Score - - - - - - 6   Fall Risk  02/10/2015 12/02/2014 08/15/2014 08/12/2014 07/08/2014  Falls in the past year? No No No No Yes  Number falls in past yr: - - - - 1  Injury with Fall? - - - - No  Risk for fall due to : Impaired mobility Impaired balance/gait;Impaired mobility;Impaired vision;Medication side effect Impaired balance/gait;Medication side effect Impaired mobility;Impaired balance/gait History of fall(s)  Risk for fall due to (comments): discussed fall prevention strategies, active with home health physical therapy. - - - -  Follow up - - - - Falls prevention discussed    Assessment: 69 year old with history of heart failure, cellulitis of right/left leg, diabetes. Member with recent admission 12/1-12/5 for . Member is active with home health for physical therapy and nursing.   Medications reviewed: several discrepancies noted-Member is taking over the counter [employer] Potassium 99 mg-states takes 2/day versus prescribed dose; metolazone tablets are 2.5mg  and member reports she has been taking one 2.5mg  on Wednesday and Saturday versus prescribed 5 mg dose; Torsemide-Not taking prescribed dose. Member has 2 bottles with 100 mg tablets and one bottle with 20 mg tablets-Member has been taking 4 tablets of torsemide, but was not aware that each bottle contained a different strength tablet. Member states, "I have just been  grabbing a bottle.  24 hour nurse advice line provided RNCM contact number provided.  Plan: RNCM called and informed heart failure clinic of member's medication discrepancies. Transition of care call next week. Send update to primary care physician. [facility] CM Care Plan Problem One        Most Recent Value   Care Plan Problem One  at risk for readmission   Role Documenting the Problem One  Care Management [place] for Problem One  Active   [facility] Long Term Goal (31-90 days)  member will not be readmitted within the next 31 days.   [facility] Long Term Goal Start Date  02/05/15   Interventions for Problem One Long Term  Goal  home visit, medications reviewed,reviewed heart failure zone tool   [facility] CM Short Term Goal #1 (0-30 days)  member will continu to weigh and record weights daily within the next 30 days.   [facility] CM Short Term Goal #1 Start Date  02/05/15   Interventions for Short Term Goal #1  encouraged member to contineu daily weights. provided [facility] calendar orgainzer for recording weights.   [facility] CM Short Term Goal #2 (0-30 days)  member will verbalize zones of the heart failure zone tool within the next 30 days.   [facility] CM Short Term Goal #2 Start Date  02/05/15   Interventions for Short Term Goal #2  reinforced heart failure zone tool    [facility] CM Care Plan Problem Two        Most Recent Value   Care Plan Problem Two  knowledge deficit related to medication dosing.   Role Documenting the Problem Two  Care Management Coordinator   Care Plan for Problem Two  Active   [facility] CM Short Term Goal #1 (0-30 days)  member will verbalize correct dosing of medications within the next 1-2 weeks.   [facility] CM Short Term Goal #1 Start Date  02/10/15   Interventions for Short Term Goal #2   reviewed medications, discussed dosages, call to update cardiologist, update primary care.      Thea Silversmith, RN, MSN, [place] Coordinator Cell: [phone]

## 2015-02-13 DIAGNOSIS — N186 End stage renal disease: Secondary | ICD-10-CM | POA: Diagnosis not present

## 2015-02-13 DIAGNOSIS — E1122 Type 2 diabetes mellitus with diabetic chronic kidney disease: Secondary | ICD-10-CM | POA: Diagnosis not present

## 2015-02-13 DIAGNOSIS — L03115 Cellulitis of right lower limb: Secondary | ICD-10-CM | POA: Diagnosis not present

## 2015-02-13 DIAGNOSIS — I130 Hypertensive heart and chronic kidney disease with heart failure and stage 1 through stage 4 chronic kidney disease, or unspecified chronic kidney disease: Secondary | ICD-10-CM | POA: Diagnosis not present

## 2015-02-13 DIAGNOSIS — I5042 Chronic combined systolic (congestive) and diastolic (congestive) heart failure: Secondary | ICD-10-CM | POA: Diagnosis not present

## 2015-02-13 DIAGNOSIS — L03116 Cellulitis of left lower limb: Secondary | ICD-10-CM | POA: Diagnosis not present

## 2015-02-16 DIAGNOSIS — I130 Hypertensive heart and chronic kidney disease with heart failure and stage 1 through stage 4 chronic kidney disease, or unspecified chronic kidney disease: Secondary | ICD-10-CM | POA: Diagnosis not present

## 2015-02-16 DIAGNOSIS — L03116 Cellulitis of left lower limb: Secondary | ICD-10-CM | POA: Diagnosis not present

## 2015-02-16 DIAGNOSIS — I5042 Chronic combined systolic (congestive) and diastolic (congestive) heart failure: Secondary | ICD-10-CM | POA: Diagnosis not present

## 2015-02-16 DIAGNOSIS — E1122 Type 2 diabetes mellitus with diabetic chronic kidney disease: Secondary | ICD-10-CM | POA: Diagnosis not present

## 2015-02-16 DIAGNOSIS — N186 End stage renal disease: Secondary | ICD-10-CM | POA: Diagnosis not present

## 2015-02-16 DIAGNOSIS — L03115 Cellulitis of right lower limb: Secondary | ICD-10-CM | POA: Diagnosis not present

## 2015-02-17 ENCOUNTER — Telehealth: Payer: Self-pay

## 2015-02-17 DIAGNOSIS — I5042 Chronic combined systolic (congestive) and diastolic (congestive) heart failure: Secondary | ICD-10-CM | POA: Diagnosis not present

## 2015-02-17 DIAGNOSIS — I130 Hypertensive heart and chronic kidney disease with heart failure and stage 1 through stage 4 chronic kidney disease, or unspecified chronic kidney disease: Secondary | ICD-10-CM | POA: Diagnosis not present

## 2015-02-17 DIAGNOSIS — E1122 Type 2 diabetes mellitus with diabetic chronic kidney disease: Secondary | ICD-10-CM | POA: Diagnosis not present

## 2015-02-17 DIAGNOSIS — L03116 Cellulitis of left lower limb: Secondary | ICD-10-CM | POA: Diagnosis not present

## 2015-02-17 DIAGNOSIS — L03115 Cellulitis of right lower limb: Secondary | ICD-10-CM | POA: Diagnosis not present

## 2015-02-17 DIAGNOSIS — N186 End stage renal disease: Secondary | ICD-10-CM | POA: Diagnosis not present

## 2015-02-17 NOTE — Telephone Encounter (Signed)
error 

## 2015-02-17 NOTE — Telephone Encounter (Signed)
Call to the home to discuss the feasibility of coming in for AWV. Dtr stated it was a "huge" effort to go to see any doctor; Offered to see her early on her next visit with Dr. Sharlet Salina in march and can call the office if she decides to do this. Homebound at present.

## 2015-02-17 NOTE — Patient Outreach (Addendum)
Unsuccessful attempt made to contact patient via telephone for assessment of community care coordination needs. Call made to [phone].  Unable to leave HIPPA compliant message as no answering device picked up.  successful in making contact with patient on 2nd attempt to reach by telephone. Introduction made and purpose of the call stated.    Patient identified herself using HIPPA identifiers by providing date of birth and address. Patient states compliance with taking medications as prescribed, including Metolazone, Torsemide and Potassium. Stating she now has her medication straighten out including the Metolazone, Torsemide and Potassium.   Patient stated she was preparing for the Nurse from [place] to come wrap her legs. Patient advised that her current [facility] CM will be in contact with her next week.    Plan: Update  assigned [facility] CM for follow up next week

## 2015-02-18 DIAGNOSIS — L03115 Cellulitis of right lower limb: Secondary | ICD-10-CM | POA: Diagnosis not present

## 2015-02-18 DIAGNOSIS — N186 End stage renal disease: Secondary | ICD-10-CM | POA: Diagnosis not present

## 2015-02-18 DIAGNOSIS — I130 Hypertensive heart and chronic kidney disease with heart failure and stage 1 through stage 4 chronic kidney disease, or unspecified chronic kidney disease: Secondary | ICD-10-CM | POA: Diagnosis not present

## 2015-02-18 DIAGNOSIS — I5042 Chronic combined systolic (congestive) and diastolic (congestive) heart failure: Secondary | ICD-10-CM | POA: Diagnosis not present

## 2015-02-18 DIAGNOSIS — E1122 Type 2 diabetes mellitus with diabetic chronic kidney disease: Secondary | ICD-10-CM | POA: Diagnosis not present

## 2015-02-18 DIAGNOSIS — L03116 Cellulitis of left lower limb: Secondary | ICD-10-CM | POA: Diagnosis not present

## 2015-02-19 DIAGNOSIS — E1122 Type 2 diabetes mellitus with diabetic chronic kidney disease: Secondary | ICD-10-CM | POA: Diagnosis not present

## 2015-02-19 DIAGNOSIS — L03115 Cellulitis of right lower limb: Secondary | ICD-10-CM | POA: Diagnosis not present

## 2015-02-19 DIAGNOSIS — I5042 Chronic combined systolic (congestive) and diastolic (congestive) heart failure: Secondary | ICD-10-CM | POA: Diagnosis not present

## 2015-02-19 DIAGNOSIS — N186 End stage renal disease: Secondary | ICD-10-CM | POA: Diagnosis not present

## 2015-02-19 DIAGNOSIS — I130 Hypertensive heart and chronic kidney disease with heart failure and stage 1 through stage 4 chronic kidney disease, or unspecified chronic kidney disease: Secondary | ICD-10-CM | POA: Diagnosis not present

## 2015-02-19 DIAGNOSIS — L03116 Cellulitis of left lower limb: Secondary | ICD-10-CM | POA: Diagnosis not present

## 2015-02-23 ENCOUNTER — Other Ambulatory Visit: Payer: Self-pay | Admitting: Internal Medicine

## 2015-02-23 DIAGNOSIS — E662 Morbid (severe) obesity with alveolar hypoventilation: Secondary | ICD-10-CM | POA: Diagnosis not present

## 2015-02-23 DIAGNOSIS — N186 End stage renal disease: Secondary | ICD-10-CM | POA: Diagnosis not present

## 2015-02-23 DIAGNOSIS — E1142 Type 2 diabetes mellitus with diabetic polyneuropathy: Secondary | ICD-10-CM | POA: Diagnosis not present

## 2015-02-23 DIAGNOSIS — L03115 Cellulitis of right lower limb: Secondary | ICD-10-CM | POA: Diagnosis not present

## 2015-02-23 DIAGNOSIS — Z9981 Dependence on supplemental oxygen: Secondary | ICD-10-CM | POA: Diagnosis not present

## 2015-02-23 DIAGNOSIS — L03116 Cellulitis of left lower limb: Secondary | ICD-10-CM | POA: Diagnosis not present

## 2015-02-23 DIAGNOSIS — M47816 Spondylosis without myelopathy or radiculopathy, lumbar region: Secondary | ICD-10-CM | POA: Diagnosis not present

## 2015-02-23 DIAGNOSIS — Z7982 Long term (current) use of aspirin: Secondary | ICD-10-CM | POA: Diagnosis not present

## 2015-02-23 DIAGNOSIS — G2581 Restless legs syndrome: Secondary | ICD-10-CM | POA: Diagnosis not present

## 2015-02-23 DIAGNOSIS — E1122 Type 2 diabetes mellitus with diabetic chronic kidney disease: Secondary | ICD-10-CM | POA: Diagnosis not present

## 2015-02-23 DIAGNOSIS — M170 Bilateral primary osteoarthritis of knee: Secondary | ICD-10-CM | POA: Diagnosis not present

## 2015-02-23 DIAGNOSIS — I5042 Chronic combined systolic (congestive) and diastolic (congestive) heart failure: Secondary | ICD-10-CM | POA: Diagnosis not present

## 2015-02-23 DIAGNOSIS — G4733 Obstructive sleep apnea (adult) (pediatric): Secondary | ICD-10-CM | POA: Diagnosis not present

## 2015-02-23 DIAGNOSIS — I130 Hypertensive heart and chronic kidney disease with heart failure and stage 1 through stage 4 chronic kidney disease, or unspecified chronic kidney disease: Secondary | ICD-10-CM | POA: Diagnosis not present

## 2015-02-23 DIAGNOSIS — E785 Hyperlipidemia, unspecified: Secondary | ICD-10-CM | POA: Diagnosis not present

## 2015-02-23 DIAGNOSIS — Z794 Long term (current) use of insulin: Secondary | ICD-10-CM | POA: Diagnosis not present

## 2015-02-25 ENCOUNTER — Telehealth (HOSPITAL_COMMUNITY): Payer: Self-pay | Admitting: *Deleted

## 2015-02-25 MED ORDER — POTASSIUM CHLORIDE CRYS ER 10 MEQ PO TBCR: 40.0000 meq | EXTENDED_RELEASE_TABLET | Freq: Every day | ORAL | Status: DC

## 2015-02-25 NOTE — Telephone Encounter (Signed)
Katie left a VM that pt did not have rx KCL she was taking OTC potassium due to difficulty swallowing.  I called and spoke w/pt, she states she has a cyst in her throat which makes it difficult to swallow and the potassium pills were too big to go down so she bought OTC potassium and has been taking it for a couple of months.  Advised we prefer pt take rx potassium and she is agreeable to try the 10 meq tabs, rx sent in if she has difficulty with them she will let us know

## 2015-02-26 ENCOUNTER — Other Ambulatory Visit: Payer: Self-pay

## 2015-02-26 NOTE — Patient Outreach (Signed)
[facility]) Care Management  02/26/2015  SERETTA WIEDRICH January 25, 1946 [id number]  Assessment: Call to complete transition of care. The phone line was busy. Phone call attempted x2. Unable to leave message.  Plan: RNCM will call next week to follow up on care management needs.  Thea Silversmith, RN, MSN, [place] Coordinator Cell: [phone]

## 2015-02-27 DIAGNOSIS — G2581 Restless legs syndrome: Secondary | ICD-10-CM | POA: Diagnosis not present

## 2015-02-27 DIAGNOSIS — L03116 Cellulitis of left lower limb: Secondary | ICD-10-CM | POA: Diagnosis not present

## 2015-02-27 DIAGNOSIS — I130 Hypertensive heart and chronic kidney disease with heart failure and stage 1 through stage 4 chronic kidney disease, or unspecified chronic kidney disease: Secondary | ICD-10-CM | POA: Diagnosis not present

## 2015-02-27 DIAGNOSIS — L03115 Cellulitis of right lower limb: Secondary | ICD-10-CM | POA: Diagnosis not present

## 2015-02-27 DIAGNOSIS — E662 Morbid (severe) obesity with alveolar hypoventilation: Secondary | ICD-10-CM | POA: Diagnosis not present

## 2015-02-27 DIAGNOSIS — G4733 Obstructive sleep apnea (adult) (pediatric): Secondary | ICD-10-CM | POA: Diagnosis not present

## 2015-02-27 DIAGNOSIS — E785 Hyperlipidemia, unspecified: Secondary | ICD-10-CM | POA: Diagnosis not present

## 2015-03-02 ENCOUNTER — Telehealth: Payer: Self-pay | Admitting: Internal Medicine

## 2015-03-02 ENCOUNTER — Other Ambulatory Visit: Payer: Self-pay

## 2015-03-02 NOTE — Telephone Encounter (Signed)
Juana with [facility] called to advise that she spoke to the patient today. Reported that blood sugar had dropped to 33. It was addressed with peanut butter and coke. Rose to 188.

## 2015-03-02 NOTE — Patient Outreach (Signed)
[place]) Care Management  03/02/2015  Melissa Myers 1946-01-09 [id number]  RNCM called to update primary care regarding telephone assessment-Melissa Myers's husband report that member blood sugar decreased to 33. Last blood sugar check 188 around 1:30- Also referred to Note in chart.  Plan: continue to follow.  Thea Silversmith, RN, MSN, [place] Coordinator Cell: [phone]

## 2015-03-02 NOTE — Patient Outreach (Signed)
[place] [facility]) Care Management  03/02/2015  Melissa Myers August 05, 1945 [id number]  RNCM called to speak with member. Member's husband, Melissa Myers answered the phone and reported that member was lying down right now, states member's blood sugar dropped to 33, treated with coke and a peanut butter and jelly sandwich. Reports it was all of a sudden. States member had her breakfast and she had protein with her breakfast. Follow up blood sugar 188 taken about a half-hour ago. Currently member is in and out of sleeping at this time. RNCM strongly encouraged Mr. Bulley to contact primary care to notify. Mr. Felipe stated that he said he would call primary care doctor. RNCM also advised more frequent blood sugar checks today. RNCM request to schedule home visit for this week-Member to call RNCM back when she is feeling better.  Plan: follow up within the next 1-2 days. Update primary care physician.  Thea Silversmith, RN, MSN, [place] Coordinator Cell: [phone]

## 2015-03-03 ENCOUNTER — Inpatient Hospital Stay (HOSPITAL_COMMUNITY): Admission: RE | Admit: 2015-03-03 | Payer: Medicare Other | Source: Ambulatory Visit

## 2015-03-03 NOTE — Telephone Encounter (Signed)
Please recommend that she call Dr. Cordelia Pen office to see if regimen needs to be adjusted.

## 2015-03-03 NOTE — Telephone Encounter (Signed)
Spoke with patient and she is going to call Dr. Cordelia Pen office to see if any changes need to be made to her medications. She has an appt with him next Friday, but will call today to see if any changes need to be made in the meantime.

## 2015-03-04 ENCOUNTER — Telehealth: Payer: Self-pay | Admitting: *Deleted

## 2015-03-04 NOTE — Telephone Encounter (Signed)
Received call pt is wanting to ask md if she would rx Oxycodone for break thru pain...Melissa Myers

## 2015-03-05 NOTE — Telephone Encounter (Signed)
Notified pt with md response.../lmb 

## 2015-03-05 NOTE — Telephone Encounter (Signed)
We do not prescribe narcotics outside of office visits.

## 2015-03-06 ENCOUNTER — Other Ambulatory Visit: Payer: Self-pay

## 2015-03-06 NOTE — Patient Outreach (Signed)
[facility]) Care Management  03/06/2015  Melissa Myers [birth year] [id number]  Subjective: "I am doing better".  Assessment: Member reports she is better with blood sugar readings. She expresses it was unusual for her blood sugar to drop like that stating she had eaten a good balanced breakfast and she had checked her blood sugar that morning prior to administering insulin. Mrs. Delafuente states she has an appointment scheduled for next Friday with endocrinologist. She alsohas an upcoming appointment with cardiologist for heart failure on the 18th. Member denies any questions or issues at this time.  Plan: home visit scheduled in two weeks.  Thea Silversmith, RN, MSN, [place] Coordinator Cell: [phone]

## 2015-03-10 DIAGNOSIS — I130 Hypertensive heart and chronic kidney disease with heart failure and stage 1 through stage 4 chronic kidney disease, or unspecified chronic kidney disease: Secondary | ICD-10-CM | POA: Diagnosis not present

## 2015-03-10 DIAGNOSIS — G4733 Obstructive sleep apnea (adult) (pediatric): Secondary | ICD-10-CM | POA: Diagnosis not present

## 2015-03-10 DIAGNOSIS — L03115 Cellulitis of right lower limb: Secondary | ICD-10-CM | POA: Diagnosis not present

## 2015-03-10 DIAGNOSIS — Z9981 Dependence on supplemental oxygen: Secondary | ICD-10-CM | POA: Diagnosis not present

## 2015-03-10 DIAGNOSIS — E1122 Type 2 diabetes mellitus with diabetic chronic kidney disease: Secondary | ICD-10-CM | POA: Diagnosis not present

## 2015-03-10 DIAGNOSIS — G2581 Restless legs syndrome: Secondary | ICD-10-CM | POA: Diagnosis not present

## 2015-03-10 DIAGNOSIS — Z794 Long term (current) use of insulin: Secondary | ICD-10-CM | POA: Diagnosis not present

## 2015-03-10 DIAGNOSIS — E785 Hyperlipidemia, unspecified: Secondary | ICD-10-CM | POA: Diagnosis not present

## 2015-03-10 DIAGNOSIS — M170 Bilateral primary osteoarthritis of knee: Secondary | ICD-10-CM | POA: Diagnosis not present

## 2015-03-10 DIAGNOSIS — E662 Morbid (severe) obesity with alveolar hypoventilation: Secondary | ICD-10-CM | POA: Diagnosis not present

## 2015-03-10 DIAGNOSIS — E1142 Type 2 diabetes mellitus with diabetic polyneuropathy: Secondary | ICD-10-CM | POA: Diagnosis not present

## 2015-03-10 DIAGNOSIS — Z7982 Long term (current) use of aspirin: Secondary | ICD-10-CM | POA: Diagnosis not present

## 2015-03-10 DIAGNOSIS — N186 End stage renal disease: Secondary | ICD-10-CM | POA: Diagnosis not present

## 2015-03-10 DIAGNOSIS — I5042 Chronic combined systolic (congestive) and diastolic (congestive) heart failure: Secondary | ICD-10-CM | POA: Diagnosis not present

## 2015-03-10 DIAGNOSIS — L03116 Cellulitis of left lower limb: Secondary | ICD-10-CM | POA: Diagnosis not present

## 2015-03-10 DIAGNOSIS — M47816 Spondylosis without myelopathy or radiculopathy, lumbar region: Secondary | ICD-10-CM | POA: Diagnosis not present

## 2015-03-11 ENCOUNTER — Ambulatory Visit (HOSPITAL_COMMUNITY)
Admission: RE | Admit: 2015-03-11 | Discharge: 2015-03-11 | Disposition: A | Discharge status: Home / Self Care | Payer: PPO | Source: Ambulatory Visit | Attending: Cardiology | Admitting: Cardiology

## 2015-03-11 VITALS — BP 112/54 | HR 70 | Wt 262.2 lb

## 2015-03-11 DIAGNOSIS — E785 Hyperlipidemia, unspecified: Secondary | ICD-10-CM | POA: Diagnosis not present

## 2015-03-11 DIAGNOSIS — I5032 Chronic diastolic (congestive) heart failure: Secondary | ICD-10-CM | POA: Diagnosis not present

## 2015-03-11 DIAGNOSIS — E662 Morbid (severe) obesity with alveolar hypoventilation: Secondary | ICD-10-CM | POA: Insufficient documentation

## 2015-03-11 DIAGNOSIS — I110 Hypertensive heart disease with heart failure: Secondary | ICD-10-CM | POA: Diagnosis not present

## 2015-03-11 NOTE — Patient Instructions (Signed)
Follow up in 4 weeks.   Please take metolazone 2.5 mg tomorrow.    Do the following things EVERYDAY: 1) Weigh yourself in the morning before breakfast. Write it down and keep it in a log. 2) Take your medicines as prescribed 3) Eat low salt foods-Limit salt (sodium) to 2000 mg per day.  4) Stay as active as you can everyday 5) Limit all fluids for the day to less than 2 liters

## 2015-03-11 NOTE — Progress Notes (Signed)
Patient ID: Melissa Myers, female   DOB: 1945/09/27, 70 y.o.   MRN: [id number]   PCP: Dr. Asa Lente Endocrinologist: Dr Loanne Drilling Primary HF: Dr Aundra Dubin.   70 yo with history of HTN, DM, hyperlipidemia, OHS/OSA, and chronic dyspnea/diastolic CHF.  Patient had an echo in 04/2009 showing moderate LVH and preserved LV systolic function. However, there was a very large LV mid-cavity gradient with valsalva.  Patient developed quite significant exertional dyspnea to the point where she was short of breath walking around her house. She had a pulmonary evaluation with Dr. Elsworth Soho but no primary lung problems were identified. Patient had an ETT-myoview in 05/2009 which was negative for ischemia or infarction. Right heart cath in 05/2009 showed mildly elevated right heart filling pressures but normal PA pressure and normal PCWP. She was started her on a beta blocker (Coreg) to try to lower her LV mid-cavity gradient (that likely occurs with exertion) and to better control BP.  V/Q scan was negative for PE.  PFTs from 06/2011 showed a restrictive defect. Last echo in 11/2013 showed EF 65% with normal RV size and systolic function.      Admitted 12/05/13 with volume overload. Diuresed with IV lasix and transitioned to torsemide 80 mg twice a day + metolazone. Overall she diuresed 21 pounds. Discharge weight was 263 pounds.   She was admitted in 9/16 with Strep agalactiae sepsis likely from cellulitis.    Admitted 12/1 though 12/5 with marked volume overload and cellulitis. Diuresed with Lasix drip and placed on antibiotics. Overall diuresed 20 pounds.   She returns for follow up for heart failure. Overall feeling ok. Not weighing daily. Weight at home trending up to form 254-262 pounds. Denies PND/Orthopnea. Ongoing SOB with exertion.  Decreased leg edema.  Limited activity. Drinking > 2 liters.  Taking all medications but has stopped taking metolazone. [facility] following for RN and PT.  Intolerant unna boots. She refuses  compression stockings.   Labs (10/13): K 4.1, creatinine 0.85 Labs (4/14): K 3.7, creatinine 1.2, BUN 43, BNP 23 Labs (5/14): K 4.1, creatinine 1.0 Labs (7/14): K 4.6 Creatinine 1.0 BNP 39.0  Labs (8/14): K 3.5, creatinine 0.91, BNP 71 Labs (11/14): K 3.7, creatinine 0.9 Labs (12/14): K 4.1, creatinine 0.8, BNP 52 Labs (3/15): K 4, creatinine 0.9 Labs (4/15): K 3.7, creatinine 0.9 Labs (5/15): K 3.8, creatinine 0.9, BNP 29 Labs (6/15): K 3.4, BUN 105, creatinine 0.9 =>1.7 Labs (6/17/5) K 4.2, BUN 41, Creatinine 0.80 Labs (08/19/13) K 3.2, BUN 71, creatinine 1.5 Labs 09/05/13 K 4.1 Creatinine 1.1 Labs 11/07/13 K 3.6 Creatinine 0.91 Labs 12/11/13 K 4.5 Creatine 1.05 Labs (12/15): K 3.8, creatinine 1.03 Labs (03/17/2014): K 4.1 Creatinine 0.99  Labs (4/16): K 3.7, creatinine 1.24 Labs (5/16): LDL 108, TGs 699 Labs (07/24/2014) K 3.5 Creatinine 1.17  Labs (07/31/2014) K 3.3 Creatinine 1.13 Labs (9/16): K 3.5, creatinine 0.93 Labs (01/26/2015) K 3.6 Creatinine 1.36   Allergies (verified):  1) ! Sulfa  2) Morphine   Past Medical History:  1. Diabetes mellitus, type II  2. Hyperlipidemia: She has been unable to tolerate statins (has been on Vytorin, lovastatin, and Crestor) due to what sounds like rhabdo: developed muscle weakness and was told she had "muscle damage" with each of these statins. She has been told not to take statins anymore.  Has hypertriglyceridemia.  3. Hypertension 4. Obesity  5. Restless Leg Syndrome  6. OA  7. peripheral neuropathy  8. OHS/OSA: Uses supplemental oxygen, does not tolerate CPAP.  9. Chronic nausea/diarrhea: ? IBS  10. Diastolic CHF: Echo ([id number]) with normal LV size, moderate LVH, EF 65-70%, LV mid-cavity gradient reaching 63 mmHg with valsalva but minimal at rest, grade I diastolic dysfunction, cannot estimate PA systolic pressure (no TR doppler signal), RV normal. RHC (4/11): Mean RA 11, RV 37/13, PA 33/15, mean PCWP 12, CI 3.3. Echo in 11/12 showed mild  LVH, EF 65-70%, no LV mid-cavity gradient was mentioned.  Echo in 4/14 showed EF 65-70%, mild LVH, grade I diastolic dysfunction, PA systolic pressure 35 mmHg, mild LV mid-cavity gradient.  Echo (5/15) with EF 60-65%, mild TR, normal RV size and systolic function. Echo (10/15) with EF 65%, moderate LVH, normal RV.  11. ETT-myoview (4/11): 5'30", stopped due to fatigue, normal EF, no evidence for scar or ischemia. 12. V/Q scan 11/12: negative for PE.  Lower extremity venous doppler US (3/14): negative for DVT.  13. PFTs (5/13): FVC 50%, FEV1 62%, ratio 106%, DLCO 69%, TLC 69%.   Family History:  Family History of Alcoholism/Addiction (parent)  Family History Diabetes 1st degree relative (grandparent)  Family History High cholesterol (parent, grandparent)  Family History Hypertension (parent, grandparent)  Family History Lung cancer (grandparent)  Stomach cancer (grandmother)  Celiac Sprue daughter  Mother with MI at 17, father with MI at 73, uncle with MI at 94, brother with stents in his 66s, multiple aunts with MIs and CVAs   Social History:  Never Smoked  no alcohol  married, lives with spouse and her mother  retired Network engineer - now housewife  Alcohol Use - no  Illicit Drug Use - no  Review of Systems  All systems reviewed and negative except as per HPI.   ROS: All systems reviewed and negative except as per HPI.   Current Outpatient Prescriptions  Medication Sig Dispense Refill  . aspirin EC 81 MG tablet Take 1 tablet (81 mg total) by mouth daily.    . B Complex-C (B-COMPLEX WITH VITAMIN C) tablet Take 1 tablet by mouth daily.    . BD INSULIN SYRINGE ULTRAFINE 31G X 15/64" 1 ML MISC USE TO INJECT INSULIN 4 TIMES DAILY AS ADVISED. 100 each 4  . carvedilol (COREG) 25 MG tablet TAKE 1 TABLET BY MOUTH TWICE DAILY WITH FOOD 60 tablet 5  . Cholecalciferol (VITAMIN D3) 2000 UNITS capsule Take 2,000 Units by mouth 4 (four) times daily - after meals and at bedtime.     . diclofenac sodium  (VOLTAREN) 1 % GEL Apply 2 g topically daily as needed (knee pain). 10 g 3  . DULoxetine (CYMBALTA) 60 MG capsule Take 1 capsule (60 mg total) by mouth daily. 90 capsule 1  . ezetimibe (ZETIA) 10 MG tablet Take 1 tablet (10 mg total) by mouth daily. 90 tablet 3  . fenofibrate 160 MG tablet TAKE 1 TABLET (160 MG TOTAL) BY MOUTH DAILY. MUST ESTABLISH WITH NEW PCP FOR ADDITIONAL REFILLS. 90 tablet 3  . gabapentin (NEURONTIN) 300 MG capsule Take 600 mg by mouth 4 (four) times daily.    . hydrALAZINE (APRESOLINE) 25 MG tablet TAKE 1 TABLET (25 MG TOTAL) BY MOUTH 3 (THREE) TIMES DAILY. 90 tablet 2  . hydrOXYzine (ATARAX/VISTARIL) 10 MG tablet Take 10 mg by mouth 3 (three) times daily as needed for anxiety. Reported on 02/10/2015    . insulin regular (HUMULIN R) 100 units/mL injection 70 units with breakfast, 70 units with lunch, and 50 units with the evening meal, and syringes 3/day 60 mL 11  . ketoconazole (NIZORAL)  2 % cream Apply 1 application topically daily. Apply to rash under breasts and private areas    . lactobacillus (FLORANEX/LACTINEX) PACK Take 1 packet (1 g total) by mouth 3 (three) times daily with meals. 30 packet 0  . Magnesium 500 MG TABS Take 500 mg by mouth daily.    [name] omega-3 acid ethyl esters (LOVAZA) 1 G capsule Take 1 g by mouth 2 (two) times daily.     [name] oxycodone (OXY-IR) 5 MG capsule Take 5 mg by mouth every 6 (six) hours.    . potassium chloride SA (K-DUR,KLOR-CON) 10 MEQ tablet Take 4 tablets (40 mEq total) by mouth daily. Take an additional 2 tabs on Wednesay and Saturday with Metolazone. 130 tablet 3  . rOPINIRole (REQUIP) 2 MG tablet TAKE 1 TABLET (2 MG TOTAL) BY MOUTH 2 (TWO) TIMES DAILY. (MUST ESTABLISH WITH NEW PCP FOR ADDITIONAL REFILLS.) 180 tablet 3  . spironolactone (ALDACTONE) 25 MG tablet Take 1 tablet (25 mg total) by mouth daily. 90 tablet 3  . torsemide (DEMADEX) 100 MG tablet Take 1 tablet (100 mg total) by mouth 2 (two) times daily. 60 tablet 6  . triamcinolone  cream (KENALOG) 0.1 % Apply 1 application topically 2 (two) times daily as needed (rash).    . vitamin B-12 (CYANOCOBALAMIN) 500 MCG tablet Take 500 mcg by mouth daily.    . metolazone (ZAROXOLYN) 5 MG tablet TAKE 5mg  (1 tablet) every Wednesday and Saturday (Patient not taking: Reported on 03/11/2015) 15 tablet 3  . rOPINIRole (REQUIP XL) 4 MG 24 hr tablet Take 1 tablet (4 mg total) by mouth at bedtime. (Patient not taking: Reported on 03/11/2015) 30 tablet 3   No current facility-administered medications for this encounter.   Filed Vitals:   03/11/15 1451  BP: 112/54  Pulse: 70  Weight: 262 lb 3.2 oz (118.933 kg)  SpO2: 92%    General: NAD, obese. Husband present . Arrived in wheelchair.   Neck: Thick, JVP difficult to assess but appeasr elevated.  No thyromegaly or thyroid nodule.  Lungs: Clear . Chronic 2L oxygen CV: Nondisplaced PMI.  Heart regular S1/S2, no S3/S4, 2/6 SEM RUSB.    No carotid bruit.  Abdomen: obese, + distended, nontender, no hepatosplenomegaly Neurologic: Alert and oriented x 3.  Psych: Normal affect. Extremities: No clubbing/cyanosis, bilateral venous stasis. R and LLE 1 + edema with chronic hyperpigmentation.     Assessment/Plan: 1. Chronic diastolic CHF:   NYHA III. Volume status elevated likely due to medication noncompliance and increased fluid intake. Hasnt been taking metolazone in over a month.  Continue torsemide 100 mg twice a day.  I have asked her to restart metolazone 5 mg twice a week.  Reinforced daily weight , low salt food choices, and limiting fluid intake to < 2 liters per day.   2. OHS:  Followed by Dr Elsworth Soho. Per Dr Elsworth Soho no role for CPAP yet. Continue home oxygen (at all times).  3. Obesity: Discussed limiting portions. Needs to increase activity.  4. HTN: . Stable. Continue current regimen.   5. Hyperlipidemia: Very high triglycerides. Lovaza added and she will continue Zetia and Fenofibrate.    Follow up in 4 weeks.        Amy Clegg   NP-C  3:25 PM

## 2015-03-12 ENCOUNTER — Other Ambulatory Visit (HOSPITAL_COMMUNITY): Payer: Self-pay | Admitting: *Deleted

## 2015-03-12 MED ORDER — POTASSIUM CHLORIDE CRYS ER 10 MEQ PO TBCR: 40.0000 meq | EXTENDED_RELEASE_TABLET | Freq: Every day | ORAL | Status: DC

## 2015-03-13 ENCOUNTER — Ambulatory Visit (INDEPENDENT_AMBULATORY_CARE_PROVIDER_SITE_OTHER): Payer: PPO | Admitting: Endocrinology

## 2015-03-13 ENCOUNTER — Telehealth: Payer: Self-pay | Admitting: Endocrinology

## 2015-03-13 ENCOUNTER — Encounter: Payer: Self-pay | Admitting: Endocrinology

## 2015-03-13 VITALS — BP 128/64 | HR 62 | Temp 97.9°F

## 2015-03-13 DIAGNOSIS — N183 Chronic kidney disease, stage 3 (moderate): Secondary | ICD-10-CM

## 2015-03-13 DIAGNOSIS — E1122 Type 2 diabetes mellitus with diabetic chronic kidney disease: Secondary | ICD-10-CM

## 2015-03-13 DIAGNOSIS — Z794 Long term (current) use of insulin: Secondary | ICD-10-CM

## 2015-03-13 DIAGNOSIS — K625 Hemorrhage of anus and rectum: Secondary | ICD-10-CM | POA: Insufficient documentation

## 2015-03-13 LAB — CBC WITH DIFFERENTIAL/PLATELET
BASOS ABS: 0 10*3/uL (ref 0.0–0.1)
Basophils Relative: 0.6 % (ref 0.0–3.0)
EOS PCT: 1.8 % (ref 0.0–5.0)
Eosinophils Absolute: 0.2 10*3/uL (ref 0.0–0.7)
HCT: 35.3 % — ABNORMAL LOW (ref 36.0–46.0)
Hemoglobin: 11.4 g/dL — ABNORMAL LOW (ref 12.0–15.0)
LYMPHS ABS: 2.5 10*3/uL (ref 0.7–4.0)
LYMPHS PCT: 29.2 % (ref 12.0–46.0)
MCHC: 32.4 g/dL (ref 30.0–36.0)
MCV: 81 fl (ref 78.0–100.0)
MONOS PCT: 6.1 % (ref 3.0–12.0)
Monocytes Absolute: 0.5 10*3/uL (ref 0.1–1.0)
NEUTROS PCT: 62.3 % (ref 43.0–77.0)
Neutro Abs: 5.4 10*3/uL (ref 1.4–7.7)
PLATELETS: 360 10*3/uL (ref 150.0–400.0)
RBC: 4.36 Mil/uL (ref 3.87–5.11)
RDW: 16 % — ABNORMAL HIGH (ref 11.5–15.5)
WBC: 8.6 10*3/uL (ref 4.0–10.5)

## 2015-03-13 MED ORDER — INSULIN GLARGINE 100 UNIT/ML SOLOSTAR PEN: 170.0000 [IU] | PEN_INJECTOR | SUBCUTANEOUS | Status: DC

## 2015-03-13 MED ORDER — INSULIN PEN NEEDLE 32G X 4 MM MISC: Status: DC

## 2015-03-13 NOTE — Telephone Encounter (Signed)
[place] needs separate script for the needles for the basaglar pens

## 2015-03-13 NOTE — Progress Notes (Signed)
Subjective:    Patient ID: Melissa Myers, female    DOB: [birth year], 70 y.o.   MRN: [id number]  HPI Pt returns for f/u of diabetes mellitus: DM type: Insulin-requiring type 2.   Dx'ed: [id number] Complications: sensory neuropathy of the lower extremities, renal insufficiency, and leg ulcers. Therapy: insulin since 2011 GDM: never DKA: never Severe hypoglycemia: several times, in 2016 Pancreatitis: never Other: takes multiple daily injections; the pattern of cbg's indicated she did not need basal insulin; she is too ill to undergo weight loss surgery; she takes human insulin, due to cost. Interval history: 2 weeks ago, she had another episode of severe hypoglycemia.  This happened at 10:30 am.  She says she ate her usual breakfast, and took usual insulin, so she is unable to cite precip factor.   Past Medical History  Diagnosis Date  . Type II or unspecified type diabetes mellitus without mention of complication, not stated as uncontrolled     insulin dep  . Hyperlipidemia     hx rhabdo on statins  . Hypertension   . Diastolic CHF ([place])   . Chronic diarrhea     a/w nausea - felt related to IBS  . RLS (restless legs syndrome)   . Osteoarthritis   . Stasis dermatitis   . GERD (gastroesophageal reflux disease)   . On home oxygen therapy     uses oxygen 2 liters min per [place] at night and prn during day  . Anemia   . Neuropathy (HCC)     feet, toes and fingers  . Disc degeneration, lumbar   . OSA (obstructive sleep apnea)     05/2009 sleep study - refuses CPAP  . Deaf     left side only  . Shortness of breath     chronic    Past Surgical History  Procedure Laterality Date  . Tubal ligation  1980  . Cholecystectomy  1997  . Uterine polyp removal  2008  . Umbilical hernia repair  1995  . Tonsillectomy  1970  . Colonoscopy N/A 12/03/2012    Procedure: COLONOSCOPY;  Surgeon: Lafayette Dragon, MD;  Location: [facility];  Service: Endoscopy;  Laterality: N/A;    Social  History   Social History  . Marital Status: Married    Spouse Name: N/A  . Number of Children: N/A  . Years of Education: N/A   Occupational History  . Not on file.   Social History Main Topics  . Smoking status: Never Smoker   . Smokeless tobacco: Never Used  . Alcohol Use: No  . Drug Use: No  . Sexual Activity: No   Other Topics Concern  . Not on file   Social History Narrative   Lives with spouse and mother. Retired Network engineer, now housewife    Current Outpatient Prescriptions on File Prior to Visit  Medication Sig Dispense Refill  . aspirin EC 81 MG tablet Take 1 tablet (81 mg total) by mouth daily.    . B Complex-C (B-COMPLEX WITH VITAMIN C) tablet Take 1 tablet by mouth daily.    . BD INSULIN SYRINGE ULTRAFINE 31G X 15/64" 1 ML MISC USE TO INJECT INSULIN 4 TIMES DAILY AS ADVISED. 100 each 4  . carvedilol (COREG) 25 MG tablet TAKE 1 TABLET BY MOUTH TWICE DAILY WITH FOOD 60 tablet 5  . Cholecalciferol (VITAMIN D3) 2000 UNITS capsule Take 2,000 Units by mouth 4 (four) times daily - after meals and at bedtime.     . diclofenac  sodium (VOLTAREN) 1 % GEL Apply 2 g topically daily as needed (knee pain). 10 g 3  . DULoxetine (CYMBALTA) 60 MG capsule Take 1 capsule (60 mg total) by mouth daily. 90 capsule 1  . ezetimibe (ZETIA) 10 MG tablet Take 1 tablet (10 mg total) by mouth daily. 90 tablet 3  . fenofibrate 160 MG tablet TAKE 1 TABLET (160 MG TOTAL) BY MOUTH DAILY. MUST ESTABLISH WITH NEW PCP FOR ADDITIONAL REFILLS. 90 tablet 3  . gabapentin (NEURONTIN) 300 MG capsule Take 600 mg by mouth 4 (four) times daily.    . hydrALAZINE (APRESOLINE) 25 MG tablet TAKE 1 TABLET (25 MG TOTAL) BY MOUTH 3 (THREE) TIMES DAILY. 90 tablet 2  . hydrOXYzine (ATARAX/VISTARIL) 10 MG tablet Take 10 mg by mouth 3 (three) times daily as needed for anxiety. Reported on 02/10/2015    . ketoconazole (NIZORAL) 2 % cream Apply 1 application topically daily. Apply to rash under breasts and private areas    .  lactobacillus (FLORANEX/LACTINEX) PACK Take 1 packet (1 g total) by mouth 3 (three) times daily with meals. 30 packet 0  . Magnesium 500 MG TABS Take 500 mg by mouth daily.    . metolazone (ZAROXOLYN) 5 MG tablet TAKE 5mg  (1 tablet) every Wednesday and Saturday 15 tablet 3  . omega-3 acid ethyl esters (LOVAZA) 1 G capsule Take 1 g by mouth 2 (two) times daily.     [name] oxycodone (OXY-IR) 5 MG capsule Take 5 mg by mouth every 6 (six) hours.    . potassium chloride (K-DUR,KLOR-CON) 10 MEQ tablet Take 4 tablets (40 mEq total) by mouth daily. Take an additional 2 tabs on Wednesay and Saturday with Metolazone. 130 tablet 3  . rOPINIRole (REQUIP) 2 MG tablet TAKE 1 TABLET (2 MG TOTAL) BY MOUTH 2 (TWO) TIMES DAILY. (MUST ESTABLISH WITH NEW PCP FOR ADDITIONAL REFILLS.) 180 tablet 3  . spironolactone (ALDACTONE) 25 MG tablet Take 1 tablet (25 mg total) by mouth daily. 90 tablet 3  . torsemide (DEMADEX) 100 MG tablet Take 1 tablet (100 mg total) by mouth 2 (two) times daily. 60 tablet 6  . triamcinolone cream (KENALOG) 0.1 % Apply 1 application topically 2 (two) times daily as needed (rash).    . vitamin B-12 (CYANOCOBALAMIN) 500 MCG tablet Take 500 mcg by mouth daily.    [name] rOPINIRole (REQUIP XL) 4 MG 24 hr tablet Take 1 tablet (4 mg total) by mouth at bedtime. (Patient not taking: Reported on 03/13/2015) 30 tablet 3   No current facility-administered medications on file prior to visit.    Allergies  Allergen Reactions  . Cymbalta [Duloxetine Hcl] Other (See Comments)    Restless leg syndrome  . Statins Other (See Comments)    Statin drugs cause muscle pain / "muscle damage"--was told by MD not to take  . Sulfa Antibiotics Diarrhea  . Levemir [Insulin Detemir] Itching  . Morphine Other (See Comments)    GI upset and headaches  . Zinc Swelling and Rash    Family History  Problem Relation Age of Onset  . Heart disease Mother   . Heart disease Father   . Heart disease      family history  . Stomach  cancer Paternal Grandmother   . Lung cancer Paternal Grandfather   . Colon cancer Neg Hx   . CVA      several aunts  . Heart attack Mother 88  . Heart attack Father 36  . Heart attack  several aunts and an uncle    BP 128/64 mmHg  Pulse 62  Temp(Src) 97.9 F (36.6 C) (Oral)  Ht   Wt   SpO2 96%  Review of Systems Denies weight change.   Pt says she passed a small amount of BRBPR last night, while she was sleeping.      Objective:   Physical Exam VITAL SIGNS:  See vs page GENERAL: no distress.  In wheelchair.  Morbid obesity. Pulses: dorsalis pedis intact bilat.   MSK: no deformity of the feet  CV: trace bilat leg edema  Skin: no ulcer on the feet. normal temp on the feet. The legs have bilat spotty hyperpigmentation Neuro: sensation is intact to touch on the feet, but decreased from normal.   Ext: There is bilateral onychomycosis of the toenails, and heavy calluses.     Lab Results  Component Value Date   HGBA1C 8.7* 01/22/2015   Lab Results  Component Value Date   WBC 8.6 03/13/2015   HGB 11.4* 03/13/2015   HCT 35.3* 03/13/2015   MCV 81.0 03/13/2015   PLT 360.0 03/13/2015      Assessment & Plan:  DM: she needs increased rx Severe hypoglycemia, recurrent.  She needs a simpler regimen BRBPR: new.  She needs f/u  Patient is advised the following: Patient Instructions  check your blood sugar twice a day.  vary the time of day when you check, between before the 3 meals, and at bedtime.  also check if you have symptoms of your blood sugar being too high or too low.  please keep a record of the readings and bring it to your next appointment here.  You can write it on any piece of paper.  please call us sooner if your blood sugar goes below 70, or if you have a lot of readings over 200.  Please come back for a follow-up appointment in 2 months.  Please change the reg insulin to "basaglar," 170 units each morning.  i have sent a prescription to your pharmacy.     Please call us next week, to tel [place] how the blood sugar is doing blood tests are requested for you today.  We'll let you know about the results. Please make an appointment to see Dr Sharlet Salina, about the bleeding.

## 2015-03-13 NOTE — Patient Instructions (Addendum)
check your blood sugar twice a day.  vary the time of day when you check, between before the 3 meals, and at bedtime.  also check if you have symptoms of your blood sugar being too high or too low.  please keep a record of the readings and bring it to your next appointment here.  You can write it on any piece of paper.  please call us sooner if your blood sugar goes below 70, or if you have a lot of readings over 200.  Please come back for a follow-up appointment in 2 months.  Please change the reg insulin to "basaglar," 170 units each morning.  i have sent a prescription to your pharmacy.   Please call us next week, to tel [place] how the blood sugar is doing blood tests are requested for you today.  We'll let you know about the results. Please make an appointment to see Dr Sharlet Salina, about the bleeding.

## 2015-03-13 NOTE — Telephone Encounter (Signed)
Rx sent to [facility].

## 2015-03-17 ENCOUNTER — Telehealth (HOSPITAL_COMMUNITY): Payer: Self-pay | Admitting: Cardiology

## 2015-03-17 NOTE — Telephone Encounter (Signed)
Can't really recommend any further with refusal of previous recommendations.     She is welcome to try lymphedema clinic.     Legrand Como [address] [place], [place] 03/17/2015 3:57 PM

## 2015-03-17 NOTE — Telephone Encounter (Signed)
Pt REFUSES unna boot (pt does have an allergy to zinc), pt REFUSES kerlix wraps, pt REFUSES ted hoses Pt is unwilling to try any recommendations for compression thus far, do you have anything additional to offer?  If we are unable to find anything for patient to try, can we refer to lymphedema clinic?   Verbal order given to d/c unna boot until further orders come from provider

## 2015-03-19 ENCOUNTER — Other Ambulatory Visit: Payer: Self-pay

## 2015-03-19 ENCOUNTER — Encounter: Payer: Self-pay | Admitting: Endocrinology

## 2015-03-19 VITALS — BP 128/70 | HR 68 | Resp 20 | Ht 59.0 in | Wt 262.2 lb

## 2015-03-19 DIAGNOSIS — Z794 Long term (current) use of insulin: Principal | ICD-10-CM

## 2015-03-19 DIAGNOSIS — E119 Type 2 diabetes mellitus without complications: Secondary | ICD-10-CM

## 2015-03-19 DIAGNOSIS — IMO0001 Reserved for inherently not codable concepts without codable children: Secondary | ICD-10-CM | POA: Insufficient documentation

## 2015-03-19 DIAGNOSIS — E088 Diabetes mellitus due to underlying condition with unspecified complications: Secondary | ICD-10-CM

## 2015-03-19 NOTE — Patient Outreach (Signed)
[facility]) Care Management  [place]  03/19/2015   Melissa Myers Aug 28, 1945 [id number]  Subjective: member reports her legs are hurting today, reports her blood sugar has been up and down and recently saw Endocrinologist that changed her insulin. But she states, she is not going to start it because of the cost. Instead continuing with the insulin she was using before. Member states she bought a box of Basaglar(5 pens) and the cost was $75.  Objective: BP 128/70 mmHg  Pulse 68  Resp 20  Ht 1.499 m (4\' 11" )  Wt 262 lb 3.2 oz (118.933 kg)  BMI 52.93 kg/m2  SpO2 93%, lungs clear, heart rate regular, bilateral leg edematous(skin intact).   Current Medications:  Current Outpatient Prescriptions  Medication Sig Dispense Refill  . aspirin EC 81 MG tablet Take 1 tablet (81 mg total) by mouth daily.    . B Complex-C (B-COMPLEX WITH VITAMIN C) tablet Take 1 tablet by mouth daily.    . BD INSULIN SYRINGE ULTRAFINE 31G X 15/64" 1 ML MISC USE TO INJECT INSULIN 4 TIMES DAILY AS ADVISED. 100 each 4  . carvedilol (COREG) 25 MG tablet TAKE 1 TABLET BY MOUTH TWICE DAILY WITH FOOD 60 tablet 5  . Cholecalciferol (VITAMIN D3) 2000 UNITS capsule Take 2,000 Units by mouth 4 (four) times daily - after meals and at bedtime.     . Coenzyme Q10 200 MG capsule Take 200 mg by mouth daily.    . diclofenac sodium (VOLTAREN) 1 % GEL Apply 2 g topically daily as needed (knee pain). 10 g 3  . DULoxetine (CYMBALTA) 60 MG capsule Take 1 capsule (60 mg total) by mouth daily. 90 capsule 1  . ezetimibe (ZETIA) 10 MG tablet Take 1 tablet (10 mg total) by mouth daily. 90 tablet 3  . fenofibrate 160 MG tablet TAKE 1 TABLET (160 MG TOTAL) BY MOUTH DAILY. MUST ESTABLISH WITH NEW PCP FOR ADDITIONAL REFILLS. 90 tablet 3  . gabapentin (NEURONTIN) 300 MG capsule Take 600 mg by mouth 4 (four) times daily.    . hydrALAZINE (APRESOLINE) 25 MG tablet TAKE 1 TABLET (25 MG TOTAL) BY MOUTH 3 (THREE) TIMES  DAILY. 90 tablet 2  . hydrOXYzine (ATARAX/VISTARIL) 10 MG tablet Take 10 mg by mouth 3 (three) times daily as needed for anxiety. Reported on 02/10/2015    . Insulin Pen Needle 32G X 4 MM MISC Use to inject insulin 1 time per day. 100 each 2  . lactobacillus (FLORANEX/LACTINEX) PACK Take 1 packet (1 g total) by mouth 3 (three) times daily with meals. 30 packet 0  . Magnesium 500 MG TABS Take 500 mg by mouth daily.    . metolazone (ZAROXOLYN) 5 MG tablet TAKE 5mg  (1 tablet) every Wednesday and Saturday 15 tablet 3  . omega-3 acid ethyl esters (LOVAZA) 1 G capsule Take 1 g by mouth 2 (two) times daily.     [name] oxycodone (OXY-IR) 5 MG capsule Take 5 mg by mouth every 6 (six) hours.    . potassium chloride (K-DUR,KLOR-CON) 10 MEQ tablet Take 4 tablets (40 mEq total) by mouth daily. Take an additional 2 tabs on Wednesay and Saturday with Metolazone. 130 tablet 3  . rOPINIRole (REQUIP) 2 MG tablet TAKE 1 TABLET (2 MG TOTAL) BY MOUTH 2 (TWO) TIMES DAILY. (MUST ESTABLISH WITH NEW PCP FOR ADDITIONAL REFILLS.) 180 tablet 3  . spironolactone (ALDACTONE) 25 MG tablet Take 1 tablet (25 mg total) by mouth daily. 90 tablet 3  .  torsemide (DEMADEX) 100 MG tablet Take 1 tablet (100 mg total) by mouth 2 (two) times daily. 60 tablet 6  . Insulin Glargine (BASAGLAR KWIKPEN) 100 UNIT/ML Solostar Pen Inject 170 Units into the skin every morning. And pen needles 3/day (Patient not taking: Reported on 03/19/2015) 60 mL 11  . ketoconazole (NIZORAL) 2 % cream Apply 1 application topically daily. Reported on 03/19/2015    . rOPINIRole (REQUIP XL) 4 MG 24 hr tablet Take 1 tablet (4 mg total) by mouth at bedtime. (Patient not taking: Reported on 03/13/2015) 30 tablet 3  . triamcinolone cream (KENALOG) 0.1 % Apply 1 application topically 2 (two) times daily as needed (rash). Reported on 03/19/2015    . vitamin B-12 (CYANOCOBALAMIN) 500 MCG tablet Take 500 mcg by mouth daily. Reported on 03/19/2015     No current facility-administered  medications for this visit.   Assessment: Member sitting in chair and taking morning medications upon RNCM's arrival. 70 year old with history of heart failure, diabetes, chronic pain, restless leg syndrome, peripheral neuropathy, lower extremity edema. Member reports her legs are no longer being wrapped. Member reports home health nurse currently still involved.   Medications: Medications reviewed-RNCM noted member has metolazone 2.5mg  tablets-reported she was taking one tablet twice a week. RNCM reinforced the ordered dose is 5 mg and instructed she would need to take two tablets to get the prescribed 5 mg dose twice a week. Melissa Myers verbalized understanding.  Diabetes- Insulin changed to Basaglar-member reports she is not able to afford this medication and therefore has resumed her previous insulin/dosing. Blood sugar today was 270 approximately 2 hours after eating.  RNCM will get [facility] pharmacy consult for medication assistance; will also request medication review and assess for medication management needs.  Chronic pain: Member reports pain to bilateral legs this morning, however by the end of home visit member reported pain has decreased with medication from 7 down to a 4.    Heart failure-RNCM discussed weights, member acknowledged that she has not been weighing self daily and has not recorded weights. RNCM discussed fluid limtiation; reviewed after visit instructions per heart failure clinic. Member states she is aware that when she monitors and limits her fluid intake that her weights decrease. Member states she wants to do better and will begin weighing/recording weights daily. Member also reports she will monitor her fluid intake.  RNCM discussed transition to Health Coach for continued disease management. Member is agreeable.  Plan: [place] consult; RNCM will follow up with member within 1-2 weeks post pharmacy referral then transition to health coach if no additional care  management needs.  Thea Silversmith, RN, MSN, [place] Coordinator Cell: [phone]

## 2015-03-20 DIAGNOSIS — I130 Hypertensive heart and chronic kidney disease with heart failure and stage 1 through stage 4 chronic kidney disease, or unspecified chronic kidney disease: Secondary | ICD-10-CM | POA: Diagnosis not present

## 2015-03-20 DIAGNOSIS — J9690 Respiratory failure, unspecified, unspecified whether with hypoxia or hypercapnia: Secondary | ICD-10-CM | POA: Diagnosis not present

## 2015-03-20 DIAGNOSIS — L03116 Cellulitis of left lower limb: Secondary | ICD-10-CM | POA: Diagnosis not present

## 2015-03-20 DIAGNOSIS — I5042 Chronic combined systolic (congestive) and diastolic (congestive) heart failure: Secondary | ICD-10-CM | POA: Diagnosis not present

## 2015-03-20 DIAGNOSIS — G2581 Restless legs syndrome: Secondary | ICD-10-CM | POA: Diagnosis not present

## 2015-03-20 DIAGNOSIS — Z794 Long term (current) use of insulin: Secondary | ICD-10-CM | POA: Diagnosis not present

## 2015-03-20 DIAGNOSIS — G4733 Obstructive sleep apnea (adult) (pediatric): Secondary | ICD-10-CM | POA: Diagnosis not present

## 2015-03-20 DIAGNOSIS — I509 Heart failure, unspecified: Secondary | ICD-10-CM | POA: Diagnosis not present

## 2015-03-20 DIAGNOSIS — N186 End stage renal disease: Secondary | ICD-10-CM | POA: Diagnosis not present

## 2015-03-20 DIAGNOSIS — R0602 Shortness of breath: Secondary | ICD-10-CM | POA: Diagnosis not present

## 2015-03-20 DIAGNOSIS — E1142 Type 2 diabetes mellitus with diabetic polyneuropathy: Secondary | ICD-10-CM | POA: Diagnosis not present

## 2015-03-20 DIAGNOSIS — E662 Morbid (severe) obesity with alveolar hypoventilation: Secondary | ICD-10-CM | POA: Diagnosis not present

## 2015-03-20 DIAGNOSIS — E1122 Type 2 diabetes mellitus with diabetic chronic kidney disease: Secondary | ICD-10-CM | POA: Diagnosis not present

## 2015-03-20 DIAGNOSIS — Z9981 Dependence on supplemental oxygen: Secondary | ICD-10-CM | POA: Diagnosis not present

## 2015-03-20 DIAGNOSIS — L03115 Cellulitis of right lower limb: Secondary | ICD-10-CM | POA: Diagnosis not present

## 2015-03-20 DIAGNOSIS — E785 Hyperlipidemia, unspecified: Secondary | ICD-10-CM | POA: Diagnosis not present

## 2015-03-20 DIAGNOSIS — M170 Bilateral primary osteoarthritis of knee: Secondary | ICD-10-CM | POA: Diagnosis not present

## 2015-03-20 DIAGNOSIS — Z7982 Long term (current) use of aspirin: Secondary | ICD-10-CM | POA: Diagnosis not present

## 2015-03-20 DIAGNOSIS — M47816 Spondylosis without myelopathy or radiculopathy, lumbar region: Secondary | ICD-10-CM | POA: Diagnosis not present

## 2015-03-20 NOTE — Telephone Encounter (Signed)
MULTIPLE ATTEMPTS TO REFER TO LYMPHEDEMA CLINIC [facility]- NO [facility] AND [place] [address] [facility]- NO [place]! ORDER FAXED TO [phone]

## 2015-03-23 ENCOUNTER — Telehealth (HOSPITAL_COMMUNITY): Payer: Self-pay

## 2015-03-23 ENCOUNTER — Telehealth: Payer: Self-pay

## 2015-03-23 NOTE — Telephone Encounter (Signed)
Call to introduce AWV; Agrees to come in 2/7 at 1pm for AWV and 1:45 for apt with Dr. Sharlet Salina

## 2015-03-23 NOTE — Telephone Encounter (Signed)
Noted Melissa Myers with Melissa Myers aware we have run through all options that I am aware of regarding clinic If she finds information about another clinic contact office and we would be more than happy to place referral Melissa Myers, states she will also recommend to patient to contact insurance to see if they will approve lymphedema boots to be worn in the home

## 2015-03-23 NOTE — Telephone Encounter (Signed)
Rose from [facility] called and siad they received a referral for lymph edema for patient.  Was told by Kalman Shan [phone]) that they only take patients with a diagnosis of cancer.

## 2015-03-24 ENCOUNTER — Telehealth: Payer: Self-pay | Admitting: Endocrinology

## 2015-03-24 DIAGNOSIS — G4733 Obstructive sleep apnea (adult) (pediatric): Secondary | ICD-10-CM | POA: Diagnosis not present

## 2015-03-24 DIAGNOSIS — L03116 Cellulitis of left lower limb: Secondary | ICD-10-CM | POA: Diagnosis not present

## 2015-03-24 DIAGNOSIS — M47816 Spondylosis without myelopathy or radiculopathy, lumbar region: Secondary | ICD-10-CM | POA: Diagnosis not present

## 2015-03-24 DIAGNOSIS — G2581 Restless legs syndrome: Secondary | ICD-10-CM | POA: Diagnosis not present

## 2015-03-24 DIAGNOSIS — L03115 Cellulitis of right lower limb: Secondary | ICD-10-CM | POA: Diagnosis not present

## 2015-03-24 DIAGNOSIS — E1122 Type 2 diabetes mellitus with diabetic chronic kidney disease: Secondary | ICD-10-CM | POA: Diagnosis not present

## 2015-03-24 DIAGNOSIS — Z794 Long term (current) use of insulin: Secondary | ICD-10-CM | POA: Diagnosis not present

## 2015-03-24 DIAGNOSIS — Z9981 Dependence on supplemental oxygen: Secondary | ICD-10-CM | POA: Diagnosis not present

## 2015-03-24 DIAGNOSIS — E785 Hyperlipidemia, unspecified: Secondary | ICD-10-CM | POA: Diagnosis not present

## 2015-03-24 DIAGNOSIS — E662 Morbid (severe) obesity with alveolar hypoventilation: Secondary | ICD-10-CM | POA: Diagnosis not present

## 2015-03-24 DIAGNOSIS — I130 Hypertensive heart and chronic kidney disease with heart failure and stage 1 through stage 4 chronic kidney disease, or unspecified chronic kidney disease: Secondary | ICD-10-CM | POA: Diagnosis not present

## 2015-03-24 DIAGNOSIS — I5042 Chronic combined systolic (congestive) and diastolic (congestive) heart failure: Secondary | ICD-10-CM | POA: Diagnosis not present

## 2015-03-24 DIAGNOSIS — Z7982 Long term (current) use of aspirin: Secondary | ICD-10-CM | POA: Diagnosis not present

## 2015-03-24 DIAGNOSIS — E1142 Type 2 diabetes mellitus with diabetic polyneuropathy: Secondary | ICD-10-CM | POA: Diagnosis not present

## 2015-03-24 DIAGNOSIS — N186 End stage renal disease: Secondary | ICD-10-CM | POA: Diagnosis not present

## 2015-03-24 DIAGNOSIS — M170 Bilateral primary osteoarthritis of knee: Secondary | ICD-10-CM | POA: Diagnosis not present

## 2015-03-24 MED ORDER — INSULIN GLARGINE 100 UNIT/ML SOLOSTAR PEN: 170.0000 [IU] | PEN_INJECTOR | SUBCUTANEOUS | Status: DC

## 2015-03-24 NOTE — Telephone Encounter (Signed)
i have sent a prescription to your pharmacy, to change to lantus Please call if this is expensive also

## 2015-03-24 NOTE — Telephone Encounter (Signed)
See note below and please advise.

## 2015-03-24 NOTE — Addendum Note (Signed)
Addended by: Renato Shin on: 03/24/2015 04:44 PM   Modules accepted: Orders, Medications

## 2015-03-24 NOTE — Telephone Encounter (Signed)
Pt advised of note below and voiced understanding.  

## 2015-03-24 NOTE — Telephone Encounter (Signed)
basaglar is too costly so the pt is not taking this please advise.

## 2015-03-26 DIAGNOSIS — Z7982 Long term (current) use of aspirin: Secondary | ICD-10-CM | POA: Diagnosis not present

## 2015-03-26 DIAGNOSIS — Z794 Long term (current) use of insulin: Secondary | ICD-10-CM | POA: Diagnosis not present

## 2015-03-26 DIAGNOSIS — I130 Hypertensive heart and chronic kidney disease with heart failure and stage 1 through stage 4 chronic kidney disease, or unspecified chronic kidney disease: Secondary | ICD-10-CM | POA: Diagnosis not present

## 2015-03-26 DIAGNOSIS — G2581 Restless legs syndrome: Secondary | ICD-10-CM | POA: Diagnosis not present

## 2015-03-26 DIAGNOSIS — M170 Bilateral primary osteoarthritis of knee: Secondary | ICD-10-CM | POA: Diagnosis not present

## 2015-03-26 DIAGNOSIS — I5042 Chronic combined systolic (congestive) and diastolic (congestive) heart failure: Secondary | ICD-10-CM | POA: Diagnosis not present

## 2015-03-26 DIAGNOSIS — G4733 Obstructive sleep apnea (adult) (pediatric): Secondary | ICD-10-CM | POA: Diagnosis not present

## 2015-03-26 DIAGNOSIS — L03116 Cellulitis of left lower limb: Secondary | ICD-10-CM | POA: Diagnosis not present

## 2015-03-26 DIAGNOSIS — L03115 Cellulitis of right lower limb: Secondary | ICD-10-CM | POA: Diagnosis not present

## 2015-03-26 DIAGNOSIS — N186 End stage renal disease: Secondary | ICD-10-CM | POA: Diagnosis not present

## 2015-03-26 DIAGNOSIS — M47816 Spondylosis without myelopathy or radiculopathy, lumbar region: Secondary | ICD-10-CM | POA: Diagnosis not present

## 2015-03-26 DIAGNOSIS — E785 Hyperlipidemia, unspecified: Secondary | ICD-10-CM | POA: Diagnosis not present

## 2015-03-26 DIAGNOSIS — E1122 Type 2 diabetes mellitus with diabetic chronic kidney disease: Secondary | ICD-10-CM | POA: Diagnosis not present

## 2015-03-26 DIAGNOSIS — Z9981 Dependence on supplemental oxygen: Secondary | ICD-10-CM | POA: Diagnosis not present

## 2015-03-26 DIAGNOSIS — E662 Morbid (severe) obesity with alveolar hypoventilation: Secondary | ICD-10-CM | POA: Diagnosis not present

## 2015-03-26 DIAGNOSIS — E1142 Type 2 diabetes mellitus with diabetic polyneuropathy: Secondary | ICD-10-CM | POA: Diagnosis not present

## 2015-03-30 ENCOUNTER — Telehealth (HOSPITAL_COMMUNITY): Payer: Self-pay | Admitting: Cardiology

## 2015-03-31 ENCOUNTER — Other Ambulatory Visit: Payer: Self-pay | Admitting: Pharmacist

## 2015-03-31 ENCOUNTER — Encounter: Payer: Self-pay | Admitting: Internal Medicine

## 2015-03-31 ENCOUNTER — Ambulatory Visit: Payer: Self-pay

## 2015-03-31 ENCOUNTER — Other Ambulatory Visit: Payer: Self-pay

## 2015-03-31 ENCOUNTER — Ambulatory Visit (INDEPENDENT_AMBULATORY_CARE_PROVIDER_SITE_OTHER): Payer: PPO | Admitting: Internal Medicine

## 2015-03-31 VITALS — BP 118/64 | HR 68 | Temp 98.1°F | Resp 18

## 2015-03-31 DIAGNOSIS — R21 Rash and other nonspecific skin eruption: Secondary | ICD-10-CM | POA: Diagnosis not present

## 2015-03-31 DIAGNOSIS — R300 Dysuria: Secondary | ICD-10-CM | POA: Diagnosis not present

## 2015-03-31 MED ORDER — NITROFURANTOIN MACROCRYSTAL 100 MG PO CAPS: 100.0000 mg | ORAL_CAPSULE | Freq: Two times a day (BID) | ORAL | Status: DC

## 2015-03-31 MED ORDER — NYSTATIN-TRIAMCINOLONE 100000-0.1 UNIT/GM-% EX OINT: 1.0000 "application " | TOPICAL_OINTMENT | Freq: Two times a day (BID) | CUTANEOUS | Status: DC

## 2015-03-31 MED ORDER — FLUCONAZOLE 150 MG PO TABS: 150.0000 mg | ORAL_TABLET | ORAL | Status: DC

## 2015-03-31 NOTE — Patient Outreach (Signed)
[facility]) Care Management  [place]   03/31/2015  CRYSTAN ADDESSO 1946/02/08 [id number]  Subjective: Melissa Myers is a 70yo who was referred to [place] from [place] for medication assistance.  Per referral, patient was initiated on Basaglar insulin Kwikpen 170 units daily.  Patient reports the cost is $75 and she cannot afford this.  Therefore she has not started this medication yet, although she purchased a one month supply.    I made outreach call to patient who reports that her provider, Dr. Loanne Drilling, switched her from Humulin insulin regimen at the last visit but she reports she is unable to afford new medications.  Chart reviewed and patient was switched from Humulin R 70 units with breakfast, 70 units with lunch, and 50 units with supper to Basaglar 170 units daily due to hypoglycemia and to simplify insulin regimen.  Patient reports the pharmacy informed her the cost for Basaglar was going to be approximately $900 per month.  Her provider then changed the prescription to Lantus and patient reports she has not picked up that prescription because the pharmacist told her husband the copay was "super high."  Patient reports she continues to take Humulin insulin.  She denies hypoglycemia and reports blood sugars have been in the 140-160s on average.    Discussed patient's current insurance plan ([employer]).  Basaglar is non-formulary; however, Lantus is a tier 3 medications that should have a $40 copay per month or $80 copay per 90 days.  Patient reports she would be able to afford that copay amount.  Patient gave me permission to reach out to [facility] regarding prescriptions.  I spoke to pharmacist, Berneta Levins, at [facility] who reports the Lantus prescription has a copay of $243 for a one month supply.  She also reports the patient picked up a 30 day supply (4 boxes = 38mL) of Basaglar on 03/13/15 (copay was  $75).  Guya called patient's insurance company to find out why Lantus copay was coming up as $243 instead of $40 and insurance company informed her that the patient is in the coverage gap.    I made outreach call to patient to update her on this information but was unable to reach her.  There was no answer to the home phone.  I left a HIPAA compliant voicemail for patient to return my call.    Objective:   Current Medications: Current Outpatient Prescriptions  Medication Sig Dispense Refill  . aspirin EC 81 MG tablet Take 1 tablet (81 mg total) by mouth daily.    . B Complex-C (B-COMPLEX WITH VITAMIN C) tablet Take 1 tablet by mouth daily.    . BD INSULIN SYRINGE ULTRAFINE 31G X 15/64" 1 ML MISC USE TO INJECT INSULIN 4 TIMES DAILY AS ADVISED. 100 each 4  . carvedilol (COREG) 25 MG tablet TAKE 1 TABLET BY MOUTH TWICE DAILY WITH FOOD 60 tablet 5  . Cholecalciferol (VITAMIN D3) 2000 UNITS capsule Take 2,000 Units by mouth 4 (four) times daily - after meals and at bedtime.     . Coenzyme Q10 200 MG capsule Take 200 mg by mouth daily.    . diclofenac sodium (VOLTAREN) 1 % GEL Apply 2 g topically daily as needed (knee pain). 10 g 3  . DULoxetine (CYMBALTA) 60 MG capsule Take 1 capsule (60 mg total) by mouth daily. 90 capsule 1  . ezetimibe (ZETIA) 10 MG tablet Take 1 tablet (10 mg total)  by mouth daily. 90 tablet 3  . fenofibrate 160 MG tablet TAKE 1 TABLET (160 MG TOTAL) BY MOUTH DAILY. MUST ESTABLISH WITH NEW PCP FOR ADDITIONAL REFILLS. 90 tablet 3  . gabapentin (NEURONTIN) 300 MG capsule Take 600 mg by mouth 4 (four) times daily.    . hydrALAZINE (APRESOLINE) 25 MG tablet TAKE 1 TABLET (25 MG TOTAL) BY MOUTH 3 (THREE) TIMES DAILY. 90 tablet 2  . hydrOXYzine (ATARAX/VISTARIL) 10 MG tablet Take 10 mg by mouth 3 (three) times daily as needed for anxiety. Reported on 02/10/2015    . Insulin Glargine (LANTUS SOLOSTAR) 100 UNIT/ML Solostar Pen Inject 170 Units into the skin every morning. And pen  needles 3/day 20 pen PRN  . Insulin Pen Needle 32G X 4 MM MISC Use to inject insulin 1 time per day. 100 each 2  . ketoconazole (NIZORAL) 2 % cream Apply 1 application topically daily. Reported on 03/19/2015    . lactobacillus (FLORANEX/LACTINEX) PACK Take 1 packet (1 g total) by mouth 3 (three) times daily with meals. 30 packet 0  . Magnesium 500 MG TABS Take 500 mg by mouth daily.    . metolazone (ZAROXOLYN) 5 MG tablet TAKE 5mg  (1 tablet) every Wednesday and Saturday 15 tablet 3  . omega-3 acid ethyl esters (LOVAZA) 1 G capsule Take 1 g by mouth 2 (two) times daily.     [name] oxycodone (OXY-IR) 5 MG capsule Take 5 mg by mouth every 6 (six) hours.    . potassium chloride (K-DUR,KLOR-CON) 10 MEQ tablet Take 4 tablets (40 mEq total) by mouth daily. Take an additional 2 tabs on Wednesay and Saturday with Metolazone. 130 tablet 3  . rOPINIRole (REQUIP XL) 4 MG 24 hr tablet Take 1 tablet (4 mg total) by mouth at bedtime. (Patient not taking: Reported on 03/13/2015) 30 tablet 3  . rOPINIRole (REQUIP) 2 MG tablet TAKE 1 TABLET (2 MG TOTAL) BY MOUTH 2 (TWO) TIMES DAILY. (MUST ESTABLISH WITH NEW PCP FOR ADDITIONAL REFILLS.) 180 tablet 3  . spironolactone (ALDACTONE) 25 MG tablet Take 1 tablet (25 mg total) by mouth daily. 90 tablet 3  . torsemide (DEMADEX) 100 MG tablet Take 1 tablet (100 mg total) by mouth 2 (two) times daily. 60 tablet 6  . triamcinolone cream (KENALOG) 0.1 % Apply 1 application topically 2 (two) times daily as needed (rash). Reported on 03/19/2015    . vitamin B-12 (CYANOCOBALAMIN) 500 MCG tablet Take 500 mcg by mouth daily. Reported on 03/19/2015     No current facility-administered medications for this visit.    Functional Status: In your present state of health, do you have any difficulty performing the following activities: 02/10/2015 01/25/2015  Hearing? N -  Vision? N -  Difficulty concentrating or making decisions? Y -  Walking or climbing stairs? Y -  Dressing or bathing? N -   Doing errands, shopping? Y N  Preparing Food and eating ? N -  Using the Toilet? N -  In the past six months, have you accidently leaked urine? Y -  Do you have problems with loss of bowel control? N -  Managing your Medications? N -  Managing your Finances? N -  Housekeeping or managing your Housekeeping? Y -    Fall/Depression Screening: PHQ 2/9 Scores 02/10/2015 12/02/2014 08/20/2014 08/15/2014 07/08/2014 09/10/2013 01/16/2013  PHQ - 2 Score 0 0 0 0 1 0 2  PHQ- 9 Score - - - - - - 6    Assessment/Plan: 1.  Medication assistance:  Patient is in the coverage gap.  Could consider patient assistance program for Lantus if patient qualifies.  After identifying that patient was in the coverage gap, I called patient to update her and to discuss medication assistance options.  There was no answer.  I left a HIPAA compliant voicemail for patient to return my call.  I will make outreach call to patient on 04/01/15 if she does not return my call today.     Elisabeth Most, Pharm.D. Pharmacy Resident [place] [phone]

## 2015-03-31 NOTE — Patient Instructions (Signed)
We have sent in the medicine for the urine called nitrofurantoin. Take 1 pill twice a day for 1 week.   We have also sent in the anti-fungal medicine called diflucan. Take 1 pill today, then 1 pill Friday then 1 pill Monday then 1 pill Thursday.   We have also sent in the cream for the rash spots that you will use twice a day until the rash is gone.

## 2015-03-31 NOTE — Patient Outreach (Addendum)
[facility]) Care Management  03/31/2015  Melissa Myers 06-Apr-1945 [id number]  Follow up, member reports she just came from her primary care office. Member has an order to be seen at the lymphodema clinic at [facility]. Also informed member that Pharmacy resident had been trying to reach her to discuss member being in the "donut hole or coverage gap". Member reports there was a message on her machine and she would call it back. RNCM encouraged member to contact pharmacist to discuss possible assistance for coverage gap.  Discussed transition to health coach for disease management-follow up of heart failure disease management and dialy weights/heart failure zone tool and disease management related to diabetes (member is starting on a new medication for diabetes).  Plan: transition to health coach. Update Pharmacy resident.  Thea Silversmith, RN, MSN, [place] Coordinator Cell: [phone]

## 2015-04-01 ENCOUNTER — Other Ambulatory Visit: Payer: Self-pay | Admitting: Pharmacist

## 2015-04-01 ENCOUNTER — Telehealth: Payer: Self-pay

## 2015-04-01 ENCOUNTER — Telehealth: Payer: Self-pay | Admitting: Endocrinology

## 2015-04-01 ENCOUNTER — Encounter: Payer: Self-pay | Admitting: Pharmacist

## 2015-04-01 DIAGNOSIS — L03116 Cellulitis of left lower limb: Secondary | ICD-10-CM | POA: Diagnosis not present

## 2015-04-01 DIAGNOSIS — E662 Morbid (severe) obesity with alveolar hypoventilation: Secondary | ICD-10-CM | POA: Diagnosis not present

## 2015-04-01 DIAGNOSIS — L03115 Cellulitis of right lower limb: Secondary | ICD-10-CM | POA: Diagnosis not present

## 2015-04-01 DIAGNOSIS — E785 Hyperlipidemia, unspecified: Secondary | ICD-10-CM | POA: Diagnosis not present

## 2015-04-01 DIAGNOSIS — E1122 Type 2 diabetes mellitus with diabetic chronic kidney disease: Secondary | ICD-10-CM | POA: Diagnosis not present

## 2015-04-01 DIAGNOSIS — M47816 Spondylosis without myelopathy or radiculopathy, lumbar region: Secondary | ICD-10-CM | POA: Diagnosis not present

## 2015-04-01 DIAGNOSIS — Z794 Long term (current) use of insulin: Secondary | ICD-10-CM | POA: Diagnosis not present

## 2015-04-01 DIAGNOSIS — I130 Hypertensive heart and chronic kidney disease with heart failure and stage 1 through stage 4 chronic kidney disease, or unspecified chronic kidney disease: Secondary | ICD-10-CM | POA: Diagnosis not present

## 2015-04-01 DIAGNOSIS — E1142 Type 2 diabetes mellitus with diabetic polyneuropathy: Secondary | ICD-10-CM | POA: Diagnosis not present

## 2015-04-01 DIAGNOSIS — M170 Bilateral primary osteoarthritis of knee: Secondary | ICD-10-CM | POA: Diagnosis not present

## 2015-04-01 DIAGNOSIS — I5042 Chronic combined systolic (congestive) and diastolic (congestive) heart failure: Secondary | ICD-10-CM | POA: Diagnosis not present

## 2015-04-01 DIAGNOSIS — Z7982 Long term (current) use of aspirin: Secondary | ICD-10-CM | POA: Diagnosis not present

## 2015-04-01 DIAGNOSIS — G4733 Obstructive sleep apnea (adult) (pediatric): Secondary | ICD-10-CM | POA: Diagnosis not present

## 2015-04-01 DIAGNOSIS — G2581 Restless legs syndrome: Secondary | ICD-10-CM | POA: Diagnosis not present

## 2015-04-01 DIAGNOSIS — N186 End stage renal disease: Secondary | ICD-10-CM | POA: Diagnosis not present

## 2015-04-01 DIAGNOSIS — Z9981 Dependence on supplemental oxygen: Secondary | ICD-10-CM | POA: Diagnosis not present

## 2015-04-01 NOTE — Patient Outreach (Signed)
[facility]) Care Management  [place]   04/01/2015  ANMOL PASCHEN 1945-05-31 [id number]  Subjective: Melissa Myers is a 70yo who was referred to [place] from [place] for medication assistance.  I determined yesterday that patient is in the coverage gap for her medications.  I made outreach call to patient to update her on medication cost.    Objective:   Current Medications: Current Outpatient Prescriptions  Medication Sig Dispense Refill  . aspirin EC 81 MG tablet Take 1 tablet (81 mg total) by mouth daily.    . B Complex-C (B-COMPLEX WITH VITAMIN C) tablet Take 1 tablet by mouth daily.    . BD INSULIN SYRINGE ULTRAFINE 31G X 15/64" 1 ML MISC USE TO INJECT INSULIN 4 TIMES DAILY AS ADVISED. 100 each 4  . carvedilol (COREG) 25 MG tablet TAKE 1 TABLET BY MOUTH TWICE DAILY WITH FOOD 60 tablet 5  . Cholecalciferol (VITAMIN D3) 2000 UNITS capsule Take 2,000 Units by mouth 4 (four) times daily - after meals and at bedtime.     . Coenzyme Q10 200 MG capsule Take 200 mg by mouth daily.    . diclofenac sodium (VOLTAREN) 1 % GEL Apply 2 g topically daily as needed (knee pain). 10 g 3  . DULoxetine (CYMBALTA) 60 MG capsule Take 1 capsule (60 mg total) by mouth daily. 90 capsule 1  . ezetimibe (ZETIA) 10 MG tablet Take 1 tablet (10 mg total) by mouth daily. 90 tablet 3  . fenofibrate 160 MG tablet TAKE 1 TABLET (160 MG TOTAL) BY MOUTH DAILY. MUST ESTABLISH WITH NEW PCP FOR ADDITIONAL REFILLS. 90 tablet 3  . fluconazole (DIFLUCAN) 150 MG tablet Take 1 tablet (150 mg total) by mouth every 3 (three) days. 4 tablet 0  . gabapentin (NEURONTIN) 300 MG capsule Take 600 mg by mouth 4 (four) times daily.    . hydrALAZINE (APRESOLINE) 25 MG tablet TAKE 1 TABLET (25 MG TOTAL) BY MOUTH 3 (THREE) TIMES DAILY. 90 tablet 2  . hydrOXYzine (ATARAX/VISTARIL) 10 MG tablet Take 10 mg by mouth 3 (three) times daily as needed for anxiety. Reported on 02/10/2015     . Insulin Pen Needle 32G X 4 MM MISC Use to inject insulin 1 time per day. 100 each 2  . ketoconazole (NIZORAL) 2 % cream Apply 1 application topically daily. Reported on 03/19/2015    . lactobacillus (FLORANEX/LACTINEX) PACK Take 1 packet (1 g total) by mouth 3 (three) times daily with meals. 30 packet 0  . Magnesium 500 MG TABS Take 500 mg by mouth daily.    . metolazone (ZAROXOLYN) 5 MG tablet TAKE 65m (1 tablet) every Wednesday and Saturday 15 tablet 3  . nitrofurantoin (MACRODANTIN) 100 MG capsule Take 1 capsule (100 mg total) by mouth 2 (two) times daily. 14 capsule 0  . nystatin-triamcinolone ointment (MYCOLOG) Apply 1 application topically 2 (two) times daily. 60 g 2  . omega-3 acid ethyl esters (LOVAZA) 1 G capsule Take 1 g by mouth 2 (two) times daily.     .Marland Kitchenoxycodone (OXY-IR) 5 MG capsule Take 5 mg by mouth every 6 (six) hours.    . potassium chloride (K-DUR,KLOR-CON) 10 MEQ tablet Take 4 tablets (40 mEq total) by mouth daily. Take an additional 2 tabs on Wednesay and Saturday with Metolazone. 130 tablet 3  . rOPINIRole (REQUIP) 2 MG tablet TAKE 1 TABLET (2 MG TOTAL) BY MOUTH 2 (TWO) TIMES DAILY. (MUST ESTABLISH WITH NEW PCP  FOR ADDITIONAL REFILLS.) 180 tablet 3  . spironolactone (ALDACTONE) 25 MG tablet Take 1 tablet (25 mg total) by mouth daily. 90 tablet 3  . torsemide (DEMADEX) 100 MG tablet Take 1 tablet (100 mg total) by mouth 2 (two) times daily. 60 tablet 6  . triamcinolone cream (KENALOG) 0.1 % Apply 1 application topically 2 (two) times daily as needed (rash). Reported on 03/19/2015    . vitamin B-12 (CYANOCOBALAMIN) 500 MCG tablet Take 500 mcg by mouth daily. Reported on 03/19/2015    . Insulin Glargine (LANTUS SOLOSTAR) 100 UNIT/ML Solostar Pen Inject 170 Units into the skin every morning. And pen needles 3/day (Patient not taking: Reported on 04/01/2015) 20 pen PRN  . rOPINIRole (REQUIP XL) 4 MG 24 hr tablet Take 1 tablet (4 mg total) by mouth at bedtime. (Patient not taking:  Reported on 04/01/2015) 30 tablet 3   No current facility-administered medications for this visit.   Functional Status: In your present state of health, do you have any difficulty performing the following activities: 02/10/2015 01/25/2015  Hearing? N -  Vision? N -  Difficulty concentrating or making decisions? Y -  Walking or climbing stairs? Y -  Dressing or bathing? N -  Doing errands, shopping? Y N  Preparing Food and eating ? N -  Using the Toilet? N -  In the past six months, have you accidently leaked urine? Y -  Do you have problems with loss of bowel control? N -  Managing your Medications? N -  Managing your Finances? N -  Housekeeping or managing your Housekeeping? Y -   Fall/Depression Screening: PHQ 2/9 Scores 02/10/2015 12/02/2014 08/20/2014 08/15/2014 07/08/2014 09/10/2013 01/16/2013  PHQ - 2 Score 0 0 0 0 1 0 2  PHQ- 9 Score - - - - - - 6    Assessment: 1.  Medication assistance:  Discussed options for medication assistance for insulin while patient is in the donut hole.  Patient is married and lives with her spouse.  Reviewed income limits marries couples for Extra Help and patient reports she does not qualify.    Reviewed Lantus patient assistance program qualifications.  To qualify for Lantus patient assistance program, patient must have spent 5% of annual income out of pocket on medications.  Based on patient's income, she would have to have spent more then $1000 out of pocket to qualify.  Patient reports she has not spent that much money out of pocket in 2017 and therefore does not qualify.    I reviewed the concept of the coverage gap and catastrophic coverage with patient.  I provided patient with an estimate for how much her medications would cost during the coverage gap and patient reports she will be able to pay out of pocket until she reaches catastrophic coverage.  Patient denies any further pharmacy needs at this time.    2.  Medication adherence:  Patient  reports she continues to take Humulin R insulin.  She has not started the insulin glargine 170 units daily as prescribed.  Patient did confirm that she picked up [place] from her pharmacy and received 4 boxes of the insulin.  Patient reports she has not started the medication because she thought that each pen only had 80 units since the pen would only dial up to 80.  I explained to patient that each pen contains 300 units and each box contains 5 pens and therefore that supply from the pharmacy should last her approximately 35 days.  I explained  to patient that to get a dose of 170 units she will have to make multiple injections.  Patient voiced understanding.  Patient reports she will start using insulin glargine 170 units daily as prescribed and stop using Humulin R.    3.  Medication review:  Drugs sorted by system:  Neurologic/Psychologic: duloxetine, hydroxyzine  Cardiovascular: aspirin, carvedilol, ezetimibe, fenofibrate, hydralazine, metolazone, omega-3-acid ethyl esters, potassium chloride, spironolactone, torsemide  Pulmonary/Allergy: none  Gastrointestinal: none  Endocrine: insulin glargina  Renal: none  Topical: diclofenac gel, ketoconazole cream, nystatin-triamcinolone ointment, triamcinolone cream  Pain: gabapentin, oxycodone  Vitamins/Minerals: b-complex-c, cholecalciferol, cyanocobalamin, magnesium  Infectious Diseases: fluconazole, nitrofurantoin  Miscellaneous: coenzyme Q10, lactobacillus, ropinirole   Duplications in therapy: none noted Gaps in therapy:  - ACE inhibitor or ARB for diabetes - statin for diabetes - will not recommend use as patient has allergy listed to statins Medications to avoid in the elderly: hydroxyzine (highly anticholinergic - risk of confusion, dry mouth, constipation, and other anticholinergic effects) Drug interactions: none - patient is on medications that can elevate potassium and on a potassium supplement. Last potassium = 3.6  (01/26/15) Other issues noted: patient has Cymbalta listed on allergy list (due to restless leg syndrome) and is currently prescribed duloxetine.  Reviewed this with patient and she reports she did not realize Cymbalta and duloxetine were the same medication and reports her restless leg syndrome has been worse lately.     Plan: 1.  Medication assistance:  Patient does not qualify for any medication assistance programs at this time (Extra Help or Lantus patient assistance program).  Patient reports she will be able to pay out of pocket for her medications during the coverage gap until she reaches catastrophic coverage.  I provided patient with my phone number and encouraged her to call me if she has any questions or concerns regarding her medications and/or medication cost.   2.  Medication adherence:  Patient reports she will begin taking insulin glargine 170 units daily as prescribed by Dr. Loanne Drilling and will stop using Humulin R.  I counseled patient on purpose, proper use, and adverse effects of insulin glargine.  Patient to continue to monitor blood glucose at least twice daily.  I reminded patient that Dr. Loanne Drilling wanted her to call his office with her blood glucose readings one week after starting insulin glargine and patient voiced understanding.  Patient reports needing more diabetic testing strips.  I sent a message to patient's provider to request a refill of test strips.    3.  Medication review:   - Patient has Cymbalta listed on allergy list (due to restless leg syndrome) and is currently prescribed duloxetine for anxiety.  Reviewed this with patient and she reports she did not realize Cymbalta and duloxetine were the same medication and reports her restless leg syndrome has been worse lately.  Please consider trial of alternate medication for anxiety.   - Patient is not currently on an ACE inhibitor or ARB.  ACE inhibitor or ARB is indicated in this patient due to diabetes and eGFR <60.  Please  consider initiation of ACE inhibitor or ARB.   - Patient is currently on hydroxyxine.  Please use caution with medications on the beers list.    Will send a fax to patient's primary care provider with this information.    4.  No further pharmacy needs identified.  Will close pharmacy program.  I provided patient with my phone number and encouraged her to call me if she has any questions  or concerns regarding her medications and/or medication cost.  Will alert [place].     Elisabeth Most, Pharm.D. Pharmacy Resident [place] [phone]

## 2015-04-01 NOTE — Telephone Encounter (Signed)
i am not the prescriber of this drug. Please refer request to PCP

## 2015-04-01 NOTE — Telephone Encounter (Signed)
Pt is requesting a refill on her gabapentin 300 mg. Rx is listed under a historical provider. Please advise if ok to refill. Thanks!

## 2015-04-01 NOTE — Telephone Encounter (Signed)
please call patient: You received a cbg meter last week We need to know what type, so we can send rx for strips

## 2015-04-01 NOTE — Telephone Encounter (Signed)
Pharmacy notified to send request to PCP.

## 2015-04-02 MED ORDER — GLUCOSE BLOOD VI STRP
ORAL_STRIP | Status: DC
Start: 2015-04-02 — End: 2015-05-11

## 2015-04-02 NOTE — Telephone Encounter (Signed)
Rx submitted for the onetouch verio test strips per pt's request.

## 2015-04-03 ENCOUNTER — Other Ambulatory Visit: Payer: Self-pay

## 2015-04-03 NOTE — Patient Outreach (Signed)
[facility]) Care Management  04/03/2015  Melissa Myers Jul 14, 1945 [id number]  Telephone call to patient for introductory call.  Patient reports she is doing ok except for some tenderness to her feet from her neuropathy.  Patient reports she is going to the lymphedema clinic on Thursday for her first initial visit.  Explained to patient health coach and role.  Patient receptive to the call.    Plan: RN Health Coach will contact patient within one month and patient agrees to next outreach.    Jone Baseman, RN, MSN [place] [phone]

## 2015-04-05 DIAGNOSIS — R21 Rash and other nonspecific skin eruption: Secondary | ICD-10-CM | POA: Insufficient documentation

## 2015-04-05 DIAGNOSIS — R300 Dysuria: Secondary | ICD-10-CM | POA: Insufficient documentation

## 2015-04-05 NOTE — Assessment & Plan Note (Signed)
Rx for nitrofurantoin for her dysuria. Presumed UTI.

## 2015-04-05 NOTE — Assessment & Plan Note (Signed)
Rx for diflucan and mycolog cream to clear up the area.

## 2015-04-05 NOTE — Progress Notes (Signed)
   Subjective:    Patient ID: Melissa Myers, female    DOB: April 01, 1945, 70 y.o.   MRN: [id number]  HPI The patient is a 70 YO female coming in for rash on her groin, under her breasts for the last several weeks. She is also having pain with urination. She is getting severe pain and burning with going. Denies abdominal pain, back pain, fevers, chills. She has itching with the rash but no boils or spots.   Review of Systems  Constitutional: Negative for fever, activity change, appetite change, fatigue and unexpected weight change.  Respiratory: Negative for cough, chest tightness, shortness of breath and wheezing.   Cardiovascular: Negative for chest pain, palpitations and leg swelling.  Gastrointestinal: Negative for nausea, abdominal pain, diarrhea, constipation and abdominal distention.  Genitourinary: Positive for dysuria. Negative for urgency, frequency, hematuria and flank pain.  Musculoskeletal: Positive for myalgias and arthralgias.  Skin: Positive for rash.  Neurological: Negative.   Psychiatric/Behavioral: Negative.       Objective:   Physical Exam  Constitutional: She is oriented to person, place, and time. She appears well-developed and well-nourished.  HENT:  Head: Normocephalic and atraumatic.  Eyes: EOM are normal.  Neck: Normal range of motion.  Cardiovascular: Normal rate and regular rhythm.   Pulmonary/Chest: Effort normal and breath sounds normal. No respiratory distress. She has no wheezes. She has no rales.  Abdominal: Soft. Bowel sounds are normal. She exhibits no distension. There is no tenderness. There is no rebound.  Musculoskeletal: She exhibits no edema.  Neurological: She is alert and oriented to person, place, and time. Coordination normal.  Skin: Skin is warm and dry.  Rash consistent with yeast under the breasts and with satellite lesions, mild redness and stigmata of scratching.   Psychiatric: She has a normal mood and affect.   Filed Vitals:   03/31/15 1407  BP: 118/64  Pulse: 68  Temp: 98.1 F (36.7 C)  TempSrc: Oral  Resp: 18  SpO2: 93%      Assessment & Plan:

## 2015-04-08 ENCOUNTER — Encounter (HOSPITAL_COMMUNITY): Payer: PPO

## 2015-04-09 ENCOUNTER — Encounter: Payer: Self-pay | Admitting: Occupational Therapy

## 2015-04-09 ENCOUNTER — Ambulatory Visit: Payer: PPO | Attending: Cardiology | Admitting: Occupational Therapy

## 2015-04-09 VITALS — Ht 62.0 in | Wt 263.0 lb

## 2015-04-09 DIAGNOSIS — I890 Lymphedema, not elsewhere classified: Secondary | ICD-10-CM | POA: Insufficient documentation

## 2015-04-10 DIAGNOSIS — Z7982 Long term (current) use of aspirin: Secondary | ICD-10-CM | POA: Diagnosis not present

## 2015-04-10 DIAGNOSIS — G2581 Restless legs syndrome: Secondary | ICD-10-CM | POA: Diagnosis not present

## 2015-04-10 DIAGNOSIS — I130 Hypertensive heart and chronic kidney disease with heart failure and stage 1 through stage 4 chronic kidney disease, or unspecified chronic kidney disease: Secondary | ICD-10-CM | POA: Diagnosis not present

## 2015-04-10 DIAGNOSIS — L03115 Cellulitis of right lower limb: Secondary | ICD-10-CM | POA: Diagnosis not present

## 2015-04-10 DIAGNOSIS — M47816 Spondylosis without myelopathy or radiculopathy, lumbar region: Secondary | ICD-10-CM | POA: Diagnosis not present

## 2015-04-10 DIAGNOSIS — M170 Bilateral primary osteoarthritis of knee: Secondary | ICD-10-CM | POA: Diagnosis not present

## 2015-04-10 DIAGNOSIS — E1122 Type 2 diabetes mellitus with diabetic chronic kidney disease: Secondary | ICD-10-CM | POA: Diagnosis not present

## 2015-04-10 DIAGNOSIS — I5042 Chronic combined systolic (congestive) and diastolic (congestive) heart failure: Secondary | ICD-10-CM | POA: Diagnosis not present

## 2015-04-10 DIAGNOSIS — E785 Hyperlipidemia, unspecified: Secondary | ICD-10-CM | POA: Diagnosis not present

## 2015-04-10 DIAGNOSIS — G4733 Obstructive sleep apnea (adult) (pediatric): Secondary | ICD-10-CM | POA: Diagnosis not present

## 2015-04-10 DIAGNOSIS — L03116 Cellulitis of left lower limb: Secondary | ICD-10-CM | POA: Diagnosis not present

## 2015-04-10 DIAGNOSIS — Z9981 Dependence on supplemental oxygen: Secondary | ICD-10-CM | POA: Diagnosis not present

## 2015-04-10 DIAGNOSIS — Z794 Long term (current) use of insulin: Secondary | ICD-10-CM | POA: Diagnosis not present

## 2015-04-10 DIAGNOSIS — E1142 Type 2 diabetes mellitus with diabetic polyneuropathy: Secondary | ICD-10-CM | POA: Diagnosis not present

## 2015-04-10 DIAGNOSIS — N186 End stage renal disease: Secondary | ICD-10-CM | POA: Diagnosis not present

## 2015-04-10 DIAGNOSIS — E662 Morbid (severe) obesity with alveolar hypoventilation: Secondary | ICD-10-CM | POA: Diagnosis not present

## 2015-04-10 NOTE — Patient Instructions (Signed)

## 2015-04-10 NOTE — Therapy (Signed)
[place] MAIN [facility] SERVICES [address] [place], [place], [address] Phone: [phone]   Fax:  [phone]  Occupational Therapy Evaluation  Patient Details  Name: Melissa Myers MRN: [id number] Date of Birth: Nov 02, 1945 No Data Recorded  Encounter Date: 04/09/2015      OT End of Session - 04/10/15 1711    Visit Number 1   Number of Visits 36   Date for OT Re-Evaluation 07/09/15   OT Start Time 0912   OT Stop Time 1024   OT Time Calculation (min) 72 min   Activity Tolerance Patient tolerated treatment well;Patient limited by fatigue;Patient limited by pain   Behavior During Therapy [facility] for tasks assessed/performed      Past Medical History  Diagnosis Date  . Type II or unspecified type diabetes mellitus without mention of complication, not stated as uncontrolled     insulin dep  . Hyperlipidemia     hx rhabdo on statins  . Hypertension   . Diastolic CHF ([place])   . Chronic diarrhea     a/w nausea - felt related to IBS  . RLS (restless legs syndrome)   . Osteoarthritis   . Stasis dermatitis   . GERD (gastroesophageal reflux disease)   . On home oxygen therapy     uses oxygen 2 liters min per [place] at night and prn during day  . Anemia   . Neuropathy (HCC)     feet, toes and fingers  . Disc degeneration, lumbar   . OSA (obstructive sleep apnea)     05/2009 sleep study - refuses CPAP  . Deaf     left side only  . Shortness of breath     chronic    Past Surgical History  Procedure Laterality Date  . Tubal ligation  1980  . Cholecystectomy  1997  . Uterine polyp removal  2008  . Umbilical hernia repair  1995  . Tonsillectomy  1970  . Colonoscopy N/A 12/03/2012    Procedure: COLONOSCOPY;  Surgeon: Lafayette Dragon, MD;  Location: [facility];  Service: Endoscopy;  Laterality: N/A;    Filed Vitals:   04/09/15 0931  Height: 5\' 2"  (1.575 m)  Weight: 263 lb (119.296 kg)    Visit Diagnosis:  Lymphedema - Plan: Ot plan  of care cert/re-cert      Subjective Assessment - 04/10/15 1640    Subjective   Pt is referred for Lymphedema care by  Loralie Champagne of [place] , [place] for evaluation and treatment of chronic, progressive , BLE Lymphedema (LE). Pt is accpomaied by her spouse, Jonni Sanger,, today. Pt reports onset of BLE leg swellingseveral years ago with no known precipitating even. Pt reports leg swelling and associated pain and discomfort has gotten progressively worse, and it negatively impacts hhewr ability to funtion.   Patient is accompained by: --   Pertinent History LE PRECAUTIONS: CHF, DM.  Contributing factors and other  medical issues also impacting funtional performance  include OA, OSA, OBESITY, RLS, periferal neuropathy. Hx cellulitis. Hx volume overload   Limitations difficulty walking, difficulty with functional mobility and transfers, decreased activity tolerance, limited basic and instrumental ADL performance, used assistive devices for self care and ambulation device in the home (walker). Requires transport wheelchair in community.   Repetition Increases Symptoms   Patient Stated Goals Learn about lymphedema; reduce leg pain, be able to walk bettersleep better   Currently in Pain? Other (Comment)  not rated. Endorses generalized myalgias  and srthralgias   Aggravating Factors  standing, walking, transfers, bending, reaching, lifting, carrying   Effect of Pain on Daily Activities limits occupational performance in all domains, including ADLs and self-care, productive activities, leisure pursuits,m and participation in social and community activities           [facility] OT Assessment - 04/10/15 0001    Assessment   Diagnosis Moderate, stage 2, BLE lymphedema 2/2 obesity and suspected CVI   Prior Therapy none; refuses compression stockings in the past due to discomfort and poor fit by report   ADL   Lower Body Bathing Minimal assistance   Lower Body Dressing Maximal  assistance;Other (comment)  unable to fit street shoes 2/2 swelling   IADL   Shopping Needs to be accompanied on any shopping trip   [employer] Needs help with all home maintenance tasks   Meal Prep Able to complete simple cold meal and snack prep   Community Mobility Travel limited to taxi or vehicle with assistance of another   Mobility   Mobility Status Needs assist          LYMPHEDEMA/ONCOLOGY QUESTIONNAIRE - 04/10/15 1702    What other symptoms do you have   Are you Having Heaviness or Tightness Yes   Are you having Pain Yes   Are you having pitting edema Yes   Body Site below knees bilaterally   Is it Hard or Difficult finding clothes that fit Yes   Do you have infections Yes   Stemmer Sign Yes   Other Symptoms skin is reddened and tight w/ venous stasis and chronic hyperpigmentation below the knees. Feet are dry, cracked and very calloused. . Legs are tender to palpation. 2+ pitting distally. No open wounds or signs/ symptoms of infection.Thighs present with hardened fibrosis and firm adipose at large nediall lobules  Tissue is firm with obvious varicosities.   Lymphedema Stage   Stage STAGE 2 SPONTANEOUSLY IRREVERSIBLE   Lymphedema Assessments   Lymphedema Assessments Lower extremities   Right Lower Extremity Lymphedema   Other BLE comparative volumetrics TBS first Rx visit                OT Treatments/Exercises (OP) - 04/10/15 0001    ADLs   Overall ADLs Pt needs varying levels of assistance with most basic and instrumental ADLs . Pt unable to bend to reach feet to bath and don/ doff footwear.    Toileting raised toilet seat   Bathing tub bench and hand held shower in walk in shower. Unable to bathe feet or inspect skin visually.   Home Maintenance needs assistance with all. Mainly sedentary lifestyle   Driving unable to drive   Work unemployed   ADL Education Given Yes   Manual Therapy   Manual Therapy Edema management               OT  Education - 04/10/15 1711    Education provided Yes   Education Details Provided Pt/caregiver skilled education and ADL training throughout visit for lymphedema etiology, progression, and treatment including Intensive and Management Phase Complete Decongestive Therapy (CDT)  Discussed lymphedema precautions, cellulitis risk, and all CDT and LE self-care components, including compression wrapping/ garments & devices, lymphatic pumping ther ex, simple self-MLD, and skin care.    Person(s) Educated Patient;Spouse   Methods Explanation;Demonstration;Handout   Comprehension Verbalized understanding;Need further instruction             OT Long Term Goals - 04/10/15 1721    OT  LONG TERM GOAL #1   Title Lymphedema (LE) self-care: Pt able to correctly apply gradient compression wraps to below knee with maximum assistance from caregiver within 2 weeks for optimal limb volume reduction.   Baseline dependent   Time 2   Period Weeks   Status New   OT LONG TERM GOAL #2   Title Lymphedema (LE) self-care : Pt >/= 85 % compliant with all daily LE self-care protocols, including simple self-manual lymphatic drainage (MLD), skin care, lymphatic pumping therex, and donning/ doffing progression garments with needed level of caregiver assistance to limit progression, infection risk and further functional decline.     Baseline dependent   Time 12   Period Weeks   Status New   OT LONG TERM GOAL #3   Title Lymphedema (LE) self-care : Pt >/= 85 % compliant with all daily LE self-care protocols, including simple self-manual lymphatic drainage (MLD), skin care, lymphatic pumping therex, and donning/ doffing progression garments with needed level of caregiver assistance to limit progression, infection risk and further functional decline.     Baseline dependent   Time 12   Period Weeks   OT LONG TERM GOAL #4   Title Pt to tolerate daily compression wraps, garments and devices in keeping w/ prescribed wear regime  within 1 week of issue date to progress and retain clinical and functional gains and to limit LE progression.   Baseline dependent   Time 12   Period Weeks   OT LONG TERM GOAL #5   Title During Management Phase CDT Pt to sustain limb volume reductions achieved during Intensive Phase CDT within 5% utilizing LE self-care protocols, appropriate compression garments/ devices, and needed level of caregiver assistance.   Baseline dependent   Time 6   Period Months               Plan - 04/10/15 1718    Clinical Impression Statement Pt presents with moderate, stage 2, BLE lymphedema (LE) secondary to obesity, dependent positioning, and suspected chronic venous insufficiency. Pt reports onset a few years ago. Pt endorses worsening leg swelling and thickening and tightening skin over time.[name] BLE LE limits functional performance in all occupational domains, including basic and instrumental ADLs, (difficulty fitting LB clothing and street shoes, bathing, dressing, home management, toilet hygiene) productive activities,  (difficulty with all activities requiring extended period of walking, standing and sitting, requires walker to ambulate within the home and requires transport wheelchair for community mobility.) LE limits participation in social and family activities, limits community participation (attending church and community events), and contributes to decreased body image ( swelling is disfiguring and embarrassing). Without skilled Occupational Therapy for Intensive and Management phase Complete Decongestive Therapy (CDT) to address chronic, progressive BLE LE, this patient's condition is expected to worsen and further functional decline is likely.   Pt will benefit from skilled therapeutic intervention in order to improve on the following deficits (Retired) Decreased endurance;Decreased skin integrity;Decreased knowledge of precautions;Impaired perceived functional ability;Improper body  mechanics;Decreased activity tolerance;Decreased knowledge of use of DME;Decreased strength;Impaired flexibility;Decreased balance;Decreased mobility;Difficulty walking;Impaired sensation;Obesity;Decreased range of motion;Increased edema;Pain   Rehab Potential Good   OT Frequency 3x / week   OT Duration 12 weeks   OT Treatment/Interventions Self-care/ADL training;Energy conservation;Compression bandaging;DME and/or AE instruction;Patient/family education;Therapeutic exercise;Manual Therapy;Therapeutic exercises;Manual lymph drainage;Therapeutic activities   Plan Commence Inensive CDT to LLE first. Once Pt reaches clinical plateau with progress towards swelling goal, fit w/ custom knee length compression garments/ devices.Repeat for RLE.   Consulted and Agree  with Plan of Care Patient;Family member/caregiver          G-Codes - 04-13-15 1726    Functional Assessment Tool Used Clinical observation and examination, medical records review, Pt and caregiver interview, comparative limb volumetrics   Functional Limitation Self care   Self Care Current Status [id number]) At least 80 percent but less than 100 percent impaired, limited or restricted   Self Care Goal Status [id number]) At least 40 percent but less than 60 percent impaired, limited or restricted      Problem List Patient Active Problem List   Diagnosis Date Noted  . Rash and nonspecific skin eruption 04/05/2015  . Dysuria 04/05/2015  . DM (diabetes mellitus), type 2 with renal complications ([place]) [id number]  . Rectal bleeding 03/13/2015  . Acute on chronic diastolic CHF (congestive heart failure), NYHA class 3 ([place]) 01/22/2015  . Acute on chronic diastolic (congestive) heart failure ([place]) 01/22/2015  . Hyperlipidemia 12/23/2014  . AKI (acute kidney injury) ([place]) 11/17/2014  . Cellulitis of left lower extremity 11/17/2014  . Toxic metabolic encephalopathy [id number]  . Sepsis due to Streptococcus agalactiae ([place]) 10/23/2014  . Open wound  of leg 09/23/2014  . Depression 01/16/2013  . Anemia 07/26/2012  . Stasis dermatitis 01/04/2011  . INSOMNIA 08/03/2009  . Chronic diastolic heart failure ([place]) 06/01/2009  . VITAMIN D DEFICIENCY 05/12/2009  . Sleep apnea 04/29/2009  . Dyslipidemia 04/20/2009  . Morbid obesity ([place]) 04/20/2009  . RESTLESS LEGS SYNDROME 04/20/2009  . PERIPHERAL NEUROPATHY 04/20/2009  . Essential hypertension 04/20/2009  . ARTHRITIS 04/20/2009  . BACK PAIN, LUMBAR, CHRONIC 04/20/2009    Andrey Spearman, MS, OTR/L, [facility] 04/13/2015 5:30 PM   [place] MAIN [facility] SERVICES [address] [place], [place], [address] Phone: [phone]   Fax:  [phone]  Name: JANAISA BIESER MRN: [id number] Date of Birth: Feb 05, 1946

## 2015-04-13 ENCOUNTER — Encounter (HOSPITAL_COMMUNITY): Payer: Self-pay | Admitting: Emergency Medicine

## 2015-04-13 ENCOUNTER — Inpatient Hospital Stay (HOSPITAL_COMMUNITY)
Admission: EM | Admit: 2015-04-13 | Discharge: 2015-04-18 | DRG: 602 | Disposition: A | Discharge status: Home Health | Payer: PPO | Attending: Internal Medicine | Admitting: Internal Medicine

## 2015-04-13 ENCOUNTER — Ambulatory Visit: Payer: PPO | Admitting: Occupational Therapy

## 2015-04-13 DIAGNOSIS — R609 Edema, unspecified: Secondary | ICD-10-CM

## 2015-04-13 DIAGNOSIS — Z888 Allergy status to other drugs, medicaments and biological substances status: Secondary | ICD-10-CM | POA: Diagnosis not present

## 2015-04-13 DIAGNOSIS — B954 Other streptococcus as the cause of diseases classified elsewhere: Secondary | ICD-10-CM | POA: Diagnosis present

## 2015-04-13 DIAGNOSIS — K219 Gastro-esophageal reflux disease without esophagitis: Secondary | ICD-10-CM | POA: Diagnosis present

## 2015-04-13 DIAGNOSIS — L0390 Cellulitis, unspecified: Secondary | ICD-10-CM | POA: Diagnosis present

## 2015-04-13 DIAGNOSIS — N390 Urinary tract infection, site not specified: Secondary | ICD-10-CM | POA: Diagnosis present

## 2015-04-13 DIAGNOSIS — Z7982 Long term (current) use of aspirin: Secondary | ICD-10-CM

## 2015-04-13 DIAGNOSIS — M79606 Pain in leg, unspecified: Secondary | ICD-10-CM | POA: Diagnosis not present

## 2015-04-13 DIAGNOSIS — Z794 Long term (current) use of insulin: Secondary | ICD-10-CM

## 2015-04-13 DIAGNOSIS — E785 Hyperlipidemia, unspecified: Secondary | ICD-10-CM | POA: Diagnosis present

## 2015-04-13 DIAGNOSIS — IMO0001 Reserved for inherently not codable concepts without codable children: Secondary | ICD-10-CM | POA: Diagnosis present

## 2015-04-13 DIAGNOSIS — M170 Bilateral primary osteoarthritis of knee: Secondary | ICD-10-CM | POA: Diagnosis not present

## 2015-04-13 DIAGNOSIS — Z9981 Dependence on supplemental oxygen: Secondary | ICD-10-CM | POA: Diagnosis not present

## 2015-04-13 DIAGNOSIS — G2581 Restless legs syndrome: Secondary | ICD-10-CM | POA: Diagnosis present

## 2015-04-13 DIAGNOSIS — Z9111 Patient's noncompliance with dietary regimen: Secondary | ICD-10-CM | POA: Diagnosis not present

## 2015-04-13 DIAGNOSIS — L03115 Cellulitis of right lower limb: Secondary | ICD-10-CM | POA: Diagnosis present

## 2015-04-13 DIAGNOSIS — N183 Chronic kidney disease, stage 3 (moderate): Secondary | ICD-10-CM | POA: Diagnosis present

## 2015-04-13 DIAGNOSIS — D649 Anemia, unspecified: Secondary | ICD-10-CM | POA: Diagnosis present

## 2015-04-13 DIAGNOSIS — Z8249 Family history of ischemic heart disease and other diseases of the circulatory system: Secondary | ICD-10-CM | POA: Diagnosis not present

## 2015-04-13 DIAGNOSIS — N186 End stage renal disease: Secondary | ICD-10-CM | POA: Diagnosis not present

## 2015-04-13 DIAGNOSIS — I10 Essential (primary) hypertension: Secondary | ICD-10-CM | POA: Diagnosis present

## 2015-04-13 DIAGNOSIS — I5033 Acute on chronic diastolic (congestive) heart failure: Secondary | ICD-10-CM

## 2015-04-13 DIAGNOSIS — I5042 Chronic combined systolic (congestive) and diastolic (congestive) heart failure: Secondary | ICD-10-CM | POA: Diagnosis not present

## 2015-04-13 DIAGNOSIS — I130 Hypertensive heart and chronic kidney disease with heart failure and stage 1 through stage 4 chronic kidney disease, or unspecified chronic kidney disease: Secondary | ICD-10-CM | POA: Diagnosis present

## 2015-04-13 DIAGNOSIS — E1140 Type 2 diabetes mellitus with diabetic neuropathy, unspecified: Secondary | ICD-10-CM | POA: Diagnosis present

## 2015-04-13 DIAGNOSIS — E1121 Type 2 diabetes mellitus with diabetic nephropathy: Secondary | ICD-10-CM | POA: Diagnosis not present

## 2015-04-13 DIAGNOSIS — E119 Type 2 diabetes mellitus without complications: Secondary | ICD-10-CM

## 2015-04-13 DIAGNOSIS — E1165 Type 2 diabetes mellitus with hyperglycemia: Secondary | ICD-10-CM | POA: Diagnosis present

## 2015-04-13 DIAGNOSIS — Z6841 Body Mass Index (BMI) 40.0 and over, adult: Secondary | ICD-10-CM

## 2015-04-13 DIAGNOSIS — E662 Morbid (severe) obesity with alveolar hypoventilation: Secondary | ICD-10-CM | POA: Diagnosis not present

## 2015-04-13 DIAGNOSIS — E1122 Type 2 diabetes mellitus with diabetic chronic kidney disease: Secondary | ICD-10-CM | POA: Diagnosis not present

## 2015-04-13 DIAGNOSIS — M1990 Unspecified osteoarthritis, unspecified site: Secondary | ICD-10-CM | POA: Diagnosis present

## 2015-04-13 DIAGNOSIS — Z882 Allergy status to sulfonamides status: Secondary | ICD-10-CM | POA: Diagnosis not present

## 2015-04-13 DIAGNOSIS — M25511 Pain in right shoulder: Secondary | ICD-10-CM

## 2015-04-13 DIAGNOSIS — G4733 Obstructive sleep apnea (adult) (pediatric): Secondary | ICD-10-CM | POA: Diagnosis not present

## 2015-04-13 DIAGNOSIS — M7989 Other specified soft tissue disorders: Secondary | ICD-10-CM | POA: Diagnosis not present

## 2015-04-13 DIAGNOSIS — I5032 Chronic diastolic (congestive) heart failure: Secondary | ICD-10-CM | POA: Diagnosis present

## 2015-04-13 DIAGNOSIS — G4730 Sleep apnea, unspecified: Secondary | ICD-10-CM | POA: Diagnosis present

## 2015-04-13 DIAGNOSIS — Z885 Allergy status to narcotic agent status: Secondary | ICD-10-CM | POA: Diagnosis not present

## 2015-04-13 DIAGNOSIS — D638 Anemia in other chronic diseases classified elsewhere: Secondary | ICD-10-CM | POA: Diagnosis not present

## 2015-04-13 DIAGNOSIS — M47816 Spondylosis without myelopathy or radiculopathy, lumbar region: Secondary | ICD-10-CM | POA: Diagnosis not present

## 2015-04-13 DIAGNOSIS — L03116 Cellulitis of left lower limb: Secondary | ICD-10-CM | POA: Diagnosis present

## 2015-04-13 DIAGNOSIS — H9192 Unspecified hearing loss, left ear: Secondary | ICD-10-CM | POA: Diagnosis not present

## 2015-04-13 DIAGNOSIS — E1142 Type 2 diabetes mellitus with diabetic polyneuropathy: Secondary | ICD-10-CM | POA: Diagnosis not present

## 2015-04-13 HISTORY — DX: Cellulitis, unspecified: L03.90

## 2015-04-13 LAB — CBC WITH DIFFERENTIAL/PLATELET
Basophils Absolute: 0 10*3/uL (ref 0.0–0.1)
Basophils Relative: 0 %
Eosinophils Absolute: 0.2 10*3/uL (ref 0.0–0.7)
Eosinophils Relative: 2 %
HCT: 29.4 % — ABNORMAL LOW (ref 36.0–46.0)
Hemoglobin: 9.3 g/dL — ABNORMAL LOW (ref 12.0–15.0)
LYMPHS ABS: 2.3 10*3/uL (ref 0.7–4.0)
LYMPHS PCT: 26 %
MCH: 26.6 pg (ref 26.0–34.0)
MCHC: 31.6 g/dL (ref 30.0–36.0)
MCV: 84 fL (ref 78.0–100.0)
Monocytes Absolute: 0.6 10*3/uL (ref 0.1–1.0)
Monocytes Relative: 7 %
NEUTROS PCT: 65 %
Neutro Abs: 5.6 10*3/uL (ref 1.7–7.7)
Platelets: 259 10*3/uL (ref 150–400)
RBC: 3.5 MIL/uL — AB (ref 3.87–5.11)
RDW: 16.1 % — ABNORMAL HIGH (ref 11.5–15.5)
WBC: 8.7 10*3/uL (ref 4.0–10.5)

## 2015-04-13 LAB — I-STAT CG4 LACTIC ACID, ED: Lactic Acid, Venous: 2.36 mmol/L (ref 0.5–2.0)

## 2015-04-13 MED ORDER — VANCOMYCIN HCL 10 G IV SOLR
2000.0000 mg | Freq: Once | INTRAVENOUS | Status: AC
  Administered 2015-04-14: 2000 mg via INTRAVENOUS
  Filled 2015-04-13: qty 2000

## 2015-04-13 MED ORDER — ROPINIROLE HCL ER 8 MG PO TB24: 4.0000 mg | ORAL_TABLET | Freq: Every day | ORAL | Status: DC

## 2015-04-13 MED ORDER — ONDANSETRON HCL 4 MG/2ML IJ SOLN
4.0000 mg | Freq: Once | INTRAMUSCULAR | Status: AC
  Administered 2015-04-13: 4 mg via INTRAVENOUS
  Filled 2015-04-13: qty 2

## 2015-04-13 MED ORDER — HYDROMORPHONE HCL 1 MG/ML IJ SOLN
1.0000 mg | Freq: Once | INTRAMUSCULAR | Status: AC
  Administered 2015-04-13: 1 mg via INTRAVENOUS
  Filled 2015-04-13: qty 1

## 2015-04-13 MED ORDER — ROPINIROLE HCL 1 MG PO TABS
2.0000 mg | ORAL_TABLET | Freq: Once | ORAL | Status: AC
  Administered 2015-04-14: 2 mg via ORAL
  Filled 2015-04-13: qty 2

## 2015-04-13 MED ORDER — PIPERACILLIN-TAZOBACTAM 3.375 G IVPB 30 MIN
3.3750 g | Freq: Once | INTRAVENOUS | Status: AC
  Administered 2015-04-14: 3.375 g via INTRAVENOUS
  Filled 2015-04-13: qty 50

## 2015-04-13 NOTE — Progress Notes (Signed)
Pharmacy Antibiotic Note  Melissa Myers is a 70 y.o. female admitted on 04/13/2015 with cellulitis.  Pharmacy has been consulted for vancomycin and zosyn dosing. Pt presents with b/l pain to lower extremities with worsening swelling over a few days.   Pt received vancomycin 2g and zosyn 3.375g IV once in the ED.  Plan: Vancomycin 1500mg  IV every 24 hours.  Goal trough 10-15 mcg/mL. Zosyn 3.375g IV q8h (4 hour infusion).  Monitor culture data, renal function and clinical course VT at SS prn     Temp (24hrs), Avg:98.5 F (36.9 C), Min:98.5 F (36.9 C), Max:98.5 F (36.9 C)   Recent Labs Lab 04/13/15 2315  WBC 8.7    CrCl cannot be calculated (Patient has no serum creatinine result on file.).    Allergies  Allergen Reactions  . Cymbalta [Duloxetine Hcl] Other (See Comments)    Restless leg syndrome  . Statins Other (See Comments)    Statin drugs cause muscle pain / "muscle damage"--was told by MD not to take  . Sulfa Antibiotics Diarrhea  . Levemir [Insulin Detemir] Itching  . Morphine Other (See Comments)    GI upset and headaches  . Zinc Swelling and Rash    Antimicrobials this admission: Vanc 2/21 >>  Zosyn 2/21 >>   Dose adjustments this admission: n/a  Microbiology results:  BCx:   UCx:    Sputum:    MRSA PCR:    Andrey Cota. Diona Foley, PharmD, [place] Clinical Pharmacist Pager [phone] 04/13/2015 11:49 PM

## 2015-04-13 NOTE — ED Notes (Signed)
MD at bedside. 

## 2015-04-13 NOTE — ED Notes (Signed)
From home via [facility] for pedal edema, ?cellulitis and severe foot pain, HTN, other VSS, NAD

## 2015-04-13 NOTE — ED Provider Notes (Signed)
By signing my name below, I, Forrestine Him, attest that this documentation has been prepared under the direction and in the presence of [place], DO.  Electronically Signed: Forrestine Him, ED Scribe. 04/13/2015. 11:41 PM.   TIME SEEN: 11:30 PM   CHIEF COMPLAINT:  Chief Complaint  Patient presents with  . Leg Swelling     HPI:  HPI Comments: Melissa Myers is a 70 y.o. female with a PMHx of IDDM, hyperlipidemia, HTN, CHF, RLS, and neuropathy who presents to the Emergency Department complaining of constant, ongoing bilateral pain to lower extremities with associated worsening swelling x 2-3 days. Pt associates pain to restless leg syndrome and cellulitis. She has had cellulitis before. No aggravating or alleviating factors at this time. Pt typically takes Requip for RLS but denies any relief. No recent fever, chills, nausea, vomiting, chest pain, or shortness of breath. Pt admits to previous history of similar symptoms.  PCP: [name], MD    ROS: See HPI Constitutional: no fever  Eyes: no drainage  ENT: no runny nose   Cardiovascular:  no chest pain  Resp: no SOB  GI: no vomiting GU: no dysuria Integumentary: no rash  Allergy: no hives  Musculoskeletal: Positive arthralgias and leg swelling  Neurological: no slurred speech ROS otherwise negative  PAST MEDICAL HISTORY/PAST SURGICAL HISTORY:  Past Medical History  Diagnosis Date  . Type II or unspecified type diabetes mellitus without mention of complication, not stated as uncontrolled     insulin dep  . Hyperlipidemia     hx rhabdo on statins  . Hypertension   . Diastolic CHF ([place])   . Chronic diarrhea     a/w nausea - felt related to IBS  . RLS (restless legs syndrome)   . Osteoarthritis   . Stasis dermatitis   . GERD (gastroesophageal reflux disease)   . On home oxygen therapy     uses oxygen 2 liters min per [place] at night and prn during day  . Anemia   . Neuropathy (HCC)     feet, toes and  fingers  . Disc degeneration, lumbar   . OSA (obstructive sleep apnea)     05/2009 sleep study - refuses CPAP  . Deaf     left side only  . Shortness of breath     chronic    MEDICATIONS:  Prior to Admission medications   Medication Sig Start Date End Date Taking? Authorizing Provider  aspirin EC 81 MG tablet Take 1 tablet (81 mg total) by mouth daily. 07/08/14   Rowe Clack, MD  B Complex-C (B-COMPLEX WITH VITAMIN C) tablet Take 1 tablet by mouth daily.    Historical Provider, MD  BD INSULIN SYRINGE ULTRAFINE 31G X 15/64" 1 ML MISC USE TO INJECT INSULIN 4 TIMES DAILY AS ADVISED. 12/30/14   Philemon Kingdom, MD  carvedilol (COREG) 25 MG tablet TAKE 1 TABLET BY MOUTH TWICE DAILY WITH FOOD 11/06/14   Jolaine Artist, MD  Cholecalciferol (VITAMIN D3) 2000 UNITS capsule Take 2,000 Units by mouth 4 (four) times daily - after meals and at bedtime.     Historical Provider, MD  Coenzyme Q10 200 MG capsule Take 200 mg by mouth daily.    Historical Provider, MD  diclofenac sodium (VOLTAREN) 1 % GEL Apply 2 g topically daily as needed (knee pain). 10/26/14   Florencia Reasons, MD  DULoxetine (CYMBALTA) 60 MG capsule Take 1 capsule (60 mg total) by mouth daily. 02/02/15 04/23/15  Rowe Clack,  MD  ezetimibe (ZETIA) 10 MG tablet Take 1 tablet (10 mg total) by mouth daily. 07/08/14   Rowe Clack, MD  fenofibrate 160 MG tablet TAKE 1 TABLET (160 MG TOTAL) BY MOUTH DAILY. MUST ESTABLISH WITH NEW PCP FOR ADDITIONAL REFILLS. 02/25/15   [name], MD  fluconazole (DIFLUCAN) 150 MG tablet Take 1 tablet (150 mg total) by mouth every 3 (three) days. 03/31/15   [name], MD  gabapentin (NEURONTIN) 300 MG capsule Take 600 mg by mouth 4 (four) times daily.    Historical Provider, MD  glucose blood (ONETOUCH VERIO) test strip Use to check blood sugar 2 times per day. Dx code: E11.9 04/02/15   Renato Shin, MD  hydrALAZINE (APRESOLINE) 25 MG tablet TAKE 1 TABLET (25 MG TOTAL) BY MOUTH 3 (THREE)  TIMES DAILY. 10/28/14   Jolaine Artist, MD  hydrOXYzine (ATARAX/VISTARIL) 10 MG tablet Take 10 mg by mouth 3 (three) times daily as needed for anxiety. Reported on 02/10/2015    Historical Provider, MD  Insulin Glargine (LANTUS SOLOSTAR) 100 UNIT/ML Solostar Pen Inject 170 Units into the skin every morning. And pen needles 3/day Patient not taking: Reported on 04/01/2015 03/24/15   Renato Shin, MD  Insulin Pen Needle 32G X 4 MM MISC Use to inject insulin 1 time per day. 03/13/15   Renato Shin, MD  ketoconazole (NIZORAL) 2 % cream Apply 1 application topically daily. Reported on 03/19/2015    Historical Provider, MD  lactobacillus (FLORANEX/LACTINEX) PACK Take 1 packet (1 g total) by mouth 3 (three) times daily with meals. 10/26/14   Florencia Reasons, MD  Magnesium 500 MG TABS Take 500 mg by mouth daily.    Historical Provider, MD  metolazone (ZAROXOLYN) 5 MG tablet TAKE 5mg  (1 tablet) every Wednesday and Saturday 12/23/14   Larey Dresser, MD  nitrofurantoin (MACRODANTIN) 100 MG capsule Take 1 capsule (100 mg total) by mouth 2 (two) times daily. 03/31/15   [name], MD  nystatin-triamcinolone ointment [facility]) Apply 1 application topically 2 (two) times daily. 03/31/15   [name], MD  omega-3 acid ethyl esters (LOVAZA) 1 G capsule Take 1 g by mouth 2 (two) times daily.     Historical Provider, MD  oxycodone (OXY-IR) 5 MG capsule Take 5 mg by mouth every 6 (six) hours.    Historical Provider, MD  potassium chloride (K-DUR,KLOR-CON) 10 MEQ tablet Take 4 tablets (40 mEq total) by mouth daily. Take an additional 2 tabs on Wednesay and Saturday with Metolazone. 03/12/15   Larey Dresser, MD  rOPINIRole (REQUIP XL) 4 MG 24 hr tablet Take 1 tablet (4 mg total) by mouth at bedtime. Patient not taking: Reported on 04/01/2015 02/03/15   [name], MD  rOPINIRole (REQUIP) 2 MG tablet TAKE 1 TABLET (2 MG TOTAL) BY MOUTH 2 (TWO) TIMES DAILY. (MUST ESTABLISH WITH NEW PCP FOR ADDITIONAL REFILLS.)  02/25/15   [name], MD  spironolactone (ALDACTONE) 25 MG tablet Take 1 tablet (25 mg total) by mouth daily. 07/08/14   Rowe Clack, MD  torsemide (DEMADEX) 100 MG tablet Take 1 tablet (100 mg total) by mouth 2 (two) times daily. 10/10/14   Larey Dresser, MD  triamcinolone cream (KENALOG) 0.1 % Apply 1 application topically 2 (two) times daily as needed (rash). Reported on 03/19/2015    Historical Provider, MD  vitamin B-12 (CYANOCOBALAMIN) 500 MCG tablet Take 500 mcg by mouth daily. Reported on 03/19/2015    Historical Provider, MD  ALLERGIES:  Allergies  Allergen Reactions  . Cymbalta [Duloxetine Hcl] Other (See Comments)    Restless leg syndrome  . Statins Other (See Comments)    Statin drugs cause muscle pain / "muscle damage"--was told by MD not to take  . Sulfa Antibiotics Diarrhea  . Levemir [Insulin Detemir] Itching  . Morphine Other (See Comments)    GI upset and headaches  . Zinc Swelling and Rash    SOCIAL HISTORY:  Social History  Substance Use Topics  . Smoking status: Never Smoker   . Smokeless tobacco: Never Used  . Alcohol Use: No    FAMILY HISTORY: Family History  Problem Relation Age of Onset  . Heart disease Mother   . Heart disease Father   . Heart disease      family history  . Stomach cancer Paternal Grandmother   . Lung cancer Paternal Grandfather   . Colon cancer Neg Hx   . CVA      several aunts  . Heart attack Mother 33  . Heart attack Father 46  . Heart attack      several aunts and an uncle    EXAM: BP 149/62 mmHg  Pulse 74  Temp(Src) 98.5 F (36.9 C) (Oral)  Resp 19  SpO2 98% CONSTITUTIONAL: Alert and oriented and responds appropriately to questions. Chronically ill appearing. Appears uncomfortable- moaning in pain and restless in the bed. Morbidly obese HEAD: Normocephalic EYES: Conjunctivae clear, PERRL ENT: normal nose; no rhinorrhea; moist mucous membranes; pharynx without lesions noted NECK: Supple, no  meningismus, no LAD  CARD: RRR; S1 and S2 appreciated; no murmurs, no clicks, no rubs, no gallops RESP: Normal chest excursion without splinting or tachypnea; breath sounds clear and equal bilaterally; no wheezes, no rhonchi, no rales, no hypoxia or respiratory distress, speaking full sentences ABD/GI: Normal bowel sounds; non-distended; soft, non-tender, no rebound, no guarding, no peritoneal signs BACK:  The back appears normal and is non-tender to palpation, there is no CVA tenderness EXT: Normal ROM in all joints; Tenderness to palpation diffusely to both legs; 3 plus pitting edema to bilateral lower extremities to level of knee with erythema and warmth to mid calf; normal capillary refill; no cyanosis, no calf tenderness or swelling. Plus 1 DP pulses palpable to both legs; small blistering to both legs with some areas of weeping clear fluid SKIN: Normal color for age and race; warm NEURO: Moves all extremities equally, sensation to light touch intact diffusely, cranial nerves II through XII intact PSYCH: The patient's mood and manner are appropriate. Grooming and personal hygiene are appropriate.  MEDICAL DECISION MAKING: Patient here with bilateral lower extremity edema and cellulitis. Appears very uncomfortable. Afebrile and hemodynamically stable. She is an insulin-dependent diabetic and reports her blood sugars have been in the 200s. Will obtain labs, cultures and give broad-spectrum antibiotics for cellulitis. Anticipate the patient will need to be admitted. We'll give Dilaudid for pain control.  ED PROGRESS: 2:40 AM Pt's blood pressure is stable. Lactate is mildly elevated 2.36. Will hydrate patient. No leukocytosis. I do feel she needs admission for bilateral lower cellulitis in a diabetic patient. Discussed with Dr. Hal Hope who agrees with admission to telemetry. Patient's PCP is Dr. Sharlet Salina with [facility].  I will place holding orders per his request. Care transferred to hospitalist  service.   EKG Interpretation  Date/Time:  Monday April 13 2015 23:20:03 EST Ventricular Rate:  76 PR Interval:  163 QRS Duration: 148 QT Interval:  399 QTC Calculation: 449 R Axis:   -  57 Text Interpretation:  Sinus rhythm Right bundle branch block No significant change since last tracing Confirmed by WARD,  DO, KRISTEN [phone]) on 04/13/2015 11:26:14 PM         I personally performed the services described in this documentation, which was scribed in my presence. The recorded information has been reviewed and is accurate.   [place], DO 04/14/15 [phone]

## 2015-04-14 ENCOUNTER — Encounter (HOSPITAL_COMMUNITY): Payer: Self-pay | Admitting: Internal Medicine

## 2015-04-14 ENCOUNTER — Inpatient Hospital Stay (HOSPITAL_COMMUNITY): Payer: PPO

## 2015-04-14 ENCOUNTER — Other Ambulatory Visit: Payer: Self-pay

## 2015-04-14 DIAGNOSIS — D638 Anemia in other chronic diseases classified elsewhere: Secondary | ICD-10-CM | POA: Diagnosis not present

## 2015-04-14 DIAGNOSIS — I5042 Chronic combined systolic (congestive) and diastolic (congestive) heart failure: Secondary | ICD-10-CM | POA: Diagnosis not present

## 2015-04-14 DIAGNOSIS — N390 Urinary tract infection, site not specified: Secondary | ICD-10-CM | POA: Diagnosis not present

## 2015-04-14 DIAGNOSIS — M170 Bilateral primary osteoarthritis of knee: Secondary | ICD-10-CM | POA: Diagnosis not present

## 2015-04-14 DIAGNOSIS — E662 Morbid (severe) obesity with alveolar hypoventilation: Secondary | ICD-10-CM | POA: Diagnosis not present

## 2015-04-14 DIAGNOSIS — I5033 Acute on chronic diastolic (congestive) heart failure: Secondary | ICD-10-CM | POA: Diagnosis not present

## 2015-04-14 DIAGNOSIS — Z888 Allergy status to other drugs, medicaments and biological substances status: Secondary | ICD-10-CM | POA: Diagnosis not present

## 2015-04-14 DIAGNOSIS — E1122 Type 2 diabetes mellitus with diabetic chronic kidney disease: Secondary | ICD-10-CM | POA: Diagnosis not present

## 2015-04-14 DIAGNOSIS — N186 End stage renal disease: Secondary | ICD-10-CM | POA: Diagnosis not present

## 2015-04-14 DIAGNOSIS — M47816 Spondylosis without myelopathy or radiculopathy, lumbar region: Secondary | ICD-10-CM | POA: Diagnosis not present

## 2015-04-14 DIAGNOSIS — L03116 Cellulitis of left lower limb: Secondary | ICD-10-CM | POA: Diagnosis not present

## 2015-04-14 DIAGNOSIS — M1990 Unspecified osteoarthritis, unspecified site: Secondary | ICD-10-CM | POA: Diagnosis not present

## 2015-04-14 DIAGNOSIS — E1121 Type 2 diabetes mellitus with diabetic nephropathy: Secondary | ICD-10-CM | POA: Diagnosis not present

## 2015-04-14 DIAGNOSIS — L0390 Cellulitis, unspecified: Secondary | ICD-10-CM | POA: Diagnosis present

## 2015-04-14 DIAGNOSIS — Z794 Long term (current) use of insulin: Secondary | ICD-10-CM | POA: Diagnosis not present

## 2015-04-14 DIAGNOSIS — Z8249 Family history of ischemic heart disease and other diseases of the circulatory system: Secondary | ICD-10-CM | POA: Diagnosis not present

## 2015-04-14 DIAGNOSIS — D649 Anemia, unspecified: Secondary | ICD-10-CM | POA: Diagnosis present

## 2015-04-14 DIAGNOSIS — Z9981 Dependence on supplemental oxygen: Secondary | ICD-10-CM | POA: Diagnosis not present

## 2015-04-14 DIAGNOSIS — M25511 Pain in right shoulder: Secondary | ICD-10-CM | POA: Diagnosis not present

## 2015-04-14 DIAGNOSIS — N183 Chronic kidney disease, stage 3 (moderate): Secondary | ICD-10-CM | POA: Diagnosis not present

## 2015-04-14 DIAGNOSIS — I5032 Chronic diastolic (congestive) heart failure: Secondary | ICD-10-CM | POA: Diagnosis not present

## 2015-04-14 DIAGNOSIS — H9192 Unspecified hearing loss, left ear: Secondary | ICD-10-CM | POA: Diagnosis not present

## 2015-04-14 DIAGNOSIS — B954 Other streptococcus as the cause of diseases classified elsewhere: Secondary | ICD-10-CM | POA: Diagnosis not present

## 2015-04-14 DIAGNOSIS — G2581 Restless legs syndrome: Secondary | ICD-10-CM | POA: Diagnosis not present

## 2015-04-14 DIAGNOSIS — L03115 Cellulitis of right lower limb: Secondary | ICD-10-CM | POA: Diagnosis not present

## 2015-04-14 DIAGNOSIS — I130 Hypertensive heart and chronic kidney disease with heart failure and stage 1 through stage 4 chronic kidney disease, or unspecified chronic kidney disease: Secondary | ICD-10-CM | POA: Diagnosis not present

## 2015-04-14 DIAGNOSIS — Z9111 Patient's noncompliance with dietary regimen: Secondary | ICD-10-CM | POA: Diagnosis not present

## 2015-04-14 DIAGNOSIS — R609 Edema, unspecified: Secondary | ICD-10-CM | POA: Diagnosis not present

## 2015-04-14 DIAGNOSIS — Z885 Allergy status to narcotic agent status: Secondary | ICD-10-CM | POA: Diagnosis not present

## 2015-04-14 DIAGNOSIS — G4733 Obstructive sleep apnea (adult) (pediatric): Secondary | ICD-10-CM | POA: Diagnosis not present

## 2015-04-14 DIAGNOSIS — E785 Hyperlipidemia, unspecified: Secondary | ICD-10-CM | POA: Diagnosis not present

## 2015-04-14 DIAGNOSIS — Z882 Allergy status to sulfonamides status: Secondary | ICD-10-CM | POA: Diagnosis not present

## 2015-04-14 DIAGNOSIS — E1165 Type 2 diabetes mellitus with hyperglycemia: Secondary | ICD-10-CM | POA: Diagnosis not present

## 2015-04-14 DIAGNOSIS — Z7982 Long term (current) use of aspirin: Secondary | ICD-10-CM | POA: Diagnosis not present

## 2015-04-14 DIAGNOSIS — M7989 Other specified soft tissue disorders: Secondary | ICD-10-CM | POA: Diagnosis not present

## 2015-04-14 DIAGNOSIS — E1142 Type 2 diabetes mellitus with diabetic polyneuropathy: Secondary | ICD-10-CM | POA: Diagnosis not present

## 2015-04-14 DIAGNOSIS — Z6841 Body Mass Index (BMI) 40.0 and over, adult: Secondary | ICD-10-CM | POA: Diagnosis not present

## 2015-04-14 DIAGNOSIS — K219 Gastro-esophageal reflux disease without esophagitis: Secondary | ICD-10-CM | POA: Diagnosis not present

## 2015-04-14 DIAGNOSIS — E1140 Type 2 diabetes mellitus with diabetic neuropathy, unspecified: Secondary | ICD-10-CM | POA: Diagnosis not present

## 2015-04-14 LAB — COMPREHENSIVE METABOLIC PANEL
ALBUMIN: 3 g/dL — AB (ref 3.5–5.0)
ALK PHOS: 75 U/L (ref 38–126)
ALK PHOS: 68 U/L (ref 38–126)
ALT: 22 U/L (ref 14–54)
ALT: 21 U/L (ref 14–54)
AST: 33 U/L (ref 15–41)
AST: 31 U/L (ref 15–41)
Albumin: 2.7 g/dL — ABNORMAL LOW (ref 3.5–5.0)
Anion gap: 11 (ref 5–15)
Anion gap: 9 (ref 5–15)
BILIRUBIN TOTAL: 0.6 mg/dL (ref 0.3–1.2)
BUN: 27 mg/dL — AB (ref 6–20)
BUN: 29 mg/dL — AB (ref 6–20)
CALCIUM: 8.9 mg/dL (ref 8.9–10.3)
CALCIUM: 8.5 mg/dL — AB (ref 8.9–10.3)
CO2: 24 mmol/L (ref 22–32)
CO2: 27 mmol/L (ref 22–32)
CREATININE: 1.13 mg/dL — AB (ref 0.44–1.00)
Chloride: 102 mmol/L (ref 101–111)
Chloride: 106 mmol/L (ref 101–111)
Creatinine, Ser: 1.35 mg/dL — ABNORMAL HIGH (ref 0.44–1.00)
GFR calc Af Amer: 56 mL/min — ABNORMAL LOW (ref >60)
GFR calc Af Amer: 45 mL/min — ABNORMAL LOW (ref >60)
GFR calc non Af Amer: 48 mL/min — ABNORMAL LOW (ref >60)
GFR calc non Af Amer: 39 mL/min — ABNORMAL LOW (ref >60)
GLUCOSE: 314 mg/dL — AB (ref 65–99)
GLUCOSE: 191 mg/dL — AB (ref 65–99)
Potassium: 4.3 mmol/L (ref 3.5–5.1)
Potassium: 4.9 mmol/L (ref 3.5–5.1)
Sodium: 137 mmol/L (ref 135–145)
Sodium: 142 mmol/L (ref 135–145)
TOTAL PROTEIN: 6.3 g/dL — AB (ref 6.5–8.1)
TOTAL PROTEIN: 5.9 g/dL — AB (ref 6.5–8.1)

## 2015-04-14 LAB — URINALYSIS, ROUTINE W REFLEX MICROSCOPIC
Bilirubin Urine: NEGATIVE
Glucose, UA: >1000 mg/dL — AB
Hgb urine dipstick: NEGATIVE
KETONES UR: NEGATIVE mg/dL
Nitrite: NEGATIVE
PH: 5.5 (ref 5.0–8.0)
Protein, ur: NEGATIVE mg/dL
SPECIFIC GRAVITY, URINE: 1.022 (ref 1.005–1.030)

## 2015-04-14 LAB — GLUCOSE, CAPILLARY
GLUCOSE-CAPILLARY: 268 mg/dL — AB (ref 65–99)
Glucose-Capillary: 178 mg/dL — ABNORMAL HIGH (ref 65–99)
Glucose-Capillary: 187 mg/dL — ABNORMAL HIGH (ref 65–99)
Glucose-Capillary: 247 mg/dL — ABNORMAL HIGH (ref 65–99)

## 2015-04-14 LAB — I-STAT CG4 LACTIC ACID, ED: LACTIC ACID, VENOUS: 1.11 mmol/L (ref 0.5–2.0)

## 2015-04-14 LAB — CBC WITH DIFFERENTIAL/PLATELET
BASOS ABS: 0 10*3/uL (ref 0.0–0.1)
BASOS PCT: 0 %
EOS PCT: 2 %
Eosinophils Absolute: 0.1 10*3/uL (ref 0.0–0.7)
HEMATOCRIT: 30.7 % — AB (ref 36.0–46.0)
Hemoglobin: 9.1 g/dL — ABNORMAL LOW (ref 12.0–15.0)
Lymphocytes Relative: 26 %
Lymphs Abs: 2 10*3/uL (ref 0.7–4.0)
MCH: 25.2 pg — ABNORMAL LOW (ref 26.0–34.0)
MCHC: 29.6 g/dL — AB (ref 30.0–36.0)
MCV: 85 fL (ref 78.0–100.0)
MONO ABS: 0.6 10*3/uL (ref 0.1–1.0)
MONOS PCT: 8 %
NEUTROS ABS: 4.9 10*3/uL (ref 1.7–7.7)
Neutrophils Relative %: 64 %
PLATELETS: 268 10*3/uL (ref 150–400)
RBC: 3.61 MIL/uL — ABNORMAL LOW (ref 3.87–5.11)
RDW: 16.2 % — AB (ref 11.5–15.5)
WBC: 7.6 10*3/uL (ref 4.0–10.5)

## 2015-04-14 LAB — URINE MICROSCOPIC-ADD ON: RBC / HPF: NONE SEEN RBC/hpf (ref 0–5)

## 2015-04-14 LAB — BRAIN NATRIURETIC PEPTIDE: B Natriuretic Peptide: 45.6 pg/mL (ref 0.0–100.0)

## 2015-04-14 MED ORDER — VITAMIN D 1000 UNITS PO TABS
2000.0000 [IU] | ORAL_TABLET | Freq: Three times a day (TID) | ORAL | Status: DC
  Administered 2015-04-14 – 2015-04-18 (×14): 2000 [IU] via ORAL
  Filled 2015-04-14 (×14): qty 2

## 2015-04-14 MED ORDER — PIPERACILLIN-TAZOBACTAM 3.375 G IVPB
3.3750 g | Freq: Three times a day (TID) | INTRAVENOUS | Status: DC
  Administered 2015-04-14: 3.375 g via INTRAVENOUS
  Filled 2015-04-14 (×3): qty 50

## 2015-04-14 MED ORDER — INSULIN ASPART 100 UNIT/ML ~~LOC~~ SOLN
0.0000 [IU] | Freq: Every day | SUBCUTANEOUS | Status: DC
  Administered 2015-04-14: 2 [IU] via SUBCUTANEOUS
  Administered 2015-04-15: 3 [IU] via SUBCUTANEOUS

## 2015-04-14 MED ORDER — ONDANSETRON HCL 4 MG/2ML IJ SOLN: 4.0000 mg | Freq: Four times a day (QID) | INTRAMUSCULAR | Status: DC | PRN

## 2015-04-14 MED ORDER — ROPINIROLE HCL ER 4 MG PO TB24: 4.0000 mg | ORAL_TABLET | Freq: Every day | ORAL | Status: DC

## 2015-04-14 MED ORDER — INSULIN GLARGINE 100 UNIT/ML ~~LOC~~ SOLN
25.0000 [IU] | Freq: Two times a day (BID) | SUBCUTANEOUS | Status: DC
  Administered 2015-04-14 – 2015-04-16 (×5): 25 [IU] via SUBCUTANEOUS
  Filled 2015-04-14 (×6): qty 0.25

## 2015-04-14 MED ORDER — OXYCODONE HCL 5 MG PO TABS
5.0000 mg | ORAL_TABLET | Freq: Four times a day (QID) | ORAL | Status: DC
  Administered 2015-04-14 – 2015-04-18 (×16): 5 mg via ORAL
  Filled 2015-04-14 (×16): qty 1

## 2015-04-14 MED ORDER — INSULIN ASPART 100 UNIT/ML ~~LOC~~ SOLN
0.0000 [IU] | Freq: Three times a day (TID) | SUBCUTANEOUS | Status: DC
  Administered 2015-04-14: 11 [IU] via SUBCUTANEOUS
  Administered 2015-04-15: 7 [IU] via SUBCUTANEOUS
  Administered 2015-04-15: 2 [IU] via SUBCUTANEOUS
  Administered 2015-04-16: 4 [IU] via SUBCUTANEOUS
  Administered 2015-04-16: 3 [IU] via SUBCUTANEOUS
  Administered 2015-04-16 – 2015-04-17 (×2): 4 [IU] via SUBCUTANEOUS
  Administered 2015-04-17: 3 [IU] via SUBCUTANEOUS

## 2015-04-14 MED ORDER — VITAMIN D3 50 MCG (2000 UT) PO CAPS: 2000.0000 [IU] | ORAL_CAPSULE | Freq: Three times a day (TID) | ORAL | Status: DC

## 2015-04-14 MED ORDER — SODIUM CHLORIDE 0.9 % IV SOLN
INTRAVENOUS | Status: DC
  Administered 2015-04-14 (×2): via INTRAVENOUS

## 2015-04-14 MED ORDER — INSULIN GLARGINE 100 UNIT/ML SOLOSTAR PEN: 170.0000 [IU] | PEN_INJECTOR | SUBCUTANEOUS | Status: DC

## 2015-04-14 MED ORDER — OMEGA-3-ACID ETHYL ESTERS 1 G PO CAPS
1.0000 g | ORAL_CAPSULE | Freq: Two times a day (BID) | ORAL | Status: DC
  Administered 2015-04-14 – 2015-04-18 (×9): 1 g via ORAL
  Filled 2015-04-14 (×9): qty 1

## 2015-04-14 MED ORDER — VANCOMYCIN HCL 10 G IV SOLR: 1500.0000 mg | INTRAVENOUS | Status: DC

## 2015-04-14 MED ORDER — TORSEMIDE 20 MG PO TABS
100.0000 mg | ORAL_TABLET | Freq: Two times a day (BID) | ORAL | Status: DC
  Administered 2015-04-14 – 2015-04-18 (×8): 100 mg via ORAL
  Filled 2015-04-14 (×9): qty 5

## 2015-04-14 MED ORDER — METOLAZONE 5 MG PO TABS: 5.0000 mg | ORAL_TABLET | ORAL | Status: DC

## 2015-04-14 MED ORDER — INSULIN ASPART 100 UNIT/ML ~~LOC~~ SOLN: 0.0000 [IU] | Freq: Three times a day (TID) | SUBCUTANEOUS | Status: DC

## 2015-04-14 MED ORDER — SPIRONOLACTONE 25 MG PO TABS
25.0000 mg | ORAL_TABLET | Freq: Every day | ORAL | Status: DC
  Administered 2015-04-14 – 2015-04-18 (×5): 25 mg via ORAL
  Filled 2015-04-14 (×5): qty 1

## 2015-04-14 MED ORDER — ACETAMINOPHEN 325 MG PO TABS: 650.0000 mg | ORAL_TABLET | Freq: Four times a day (QID) | ORAL | Status: DC | PRN

## 2015-04-14 MED ORDER — SODIUM CHLORIDE 0.9 % IV BOLUS (SEPSIS)
1000.0000 mL | Freq: Once | INTRAVENOUS | Status: AC
  Administered 2015-04-14: 1000 mL via INTRAVENOUS

## 2015-04-14 MED ORDER — METOLAZONE 5 MG PO TABS
5.0000 mg | ORAL_TABLET | Freq: Every day | ORAL | Status: DC
  Administered 2015-04-14 – 2015-04-15 (×2): 5 mg via ORAL
  Filled 2015-04-14 (×2): qty 1

## 2015-04-14 MED ORDER — ENOXAPARIN SODIUM 40 MG/0.4ML ~~LOC~~ SOLN
40.0000 mg | SUBCUTANEOUS | Status: DC
  Administered 2015-04-14 – 2015-04-17 (×4): 40 mg via SUBCUTANEOUS
  Filled 2015-04-14 (×4): qty 0.4

## 2015-04-14 MED ORDER — EZETIMIBE 10 MG PO TABS
10.0000 mg | ORAL_TABLET | Freq: Every day | ORAL | Status: DC
  Administered 2015-04-14 – 2015-04-18 (×5): 10 mg via ORAL
  Filled 2015-04-14 (×5): qty 1

## 2015-04-14 MED ORDER — HYDROXYZINE HCL 10 MG PO TABS
10.0000 mg | ORAL_TABLET | Freq: Three times a day (TID) | ORAL | Status: DC | PRN
  Filled 2015-04-14: qty 1

## 2015-04-14 MED ORDER — GABAPENTIN 300 MG PO CAPS
600.0000 mg | ORAL_CAPSULE | Freq: Four times a day (QID) | ORAL | Status: DC
  Administered 2015-04-14 – 2015-04-18 (×16): 600 mg via ORAL
  Filled 2015-04-14 (×16): qty 2

## 2015-04-14 MED ORDER — ONDANSETRON HCL 4 MG PO TABS: 4.0000 mg | ORAL_TABLET | Freq: Four times a day (QID) | ORAL | Status: DC | PRN

## 2015-04-14 MED ORDER — COENZYME Q10 200 MG PO CAPS: 200.0000 mg | ORAL_CAPSULE | Freq: Every day | ORAL | Status: DC

## 2015-04-14 MED ORDER — DOXYCYCLINE HYCLATE 100 MG PO TABS
100.0000 mg | ORAL_TABLET | Freq: Two times a day (BID) | ORAL | Status: DC
  Administered 2015-04-14 – 2015-04-15 (×4): 100 mg via ORAL
  Filled 2015-04-14 (×4): qty 1

## 2015-04-14 MED ORDER — B COMPLEX-C PO TABS
1.0000 | ORAL_TABLET | Freq: Every day | ORAL | Status: DC
  Administered 2015-04-14 – 2015-04-18 (×5): 1 via ORAL
  Filled 2015-04-14 (×6): qty 1

## 2015-04-14 MED ORDER — ROPINIROLE HCL 1 MG PO TABS
2.0000 mg | ORAL_TABLET | Freq: Two times a day (BID) | ORAL | Status: DC
  Administered 2015-04-14 – 2015-04-18 (×9): 2 mg via ORAL
  Filled 2015-04-14 (×9): qty 2

## 2015-04-14 MED ORDER — HYDRALAZINE HCL 25 MG PO TABS
25.0000 mg | ORAL_TABLET | Freq: Three times a day (TID) | ORAL | Status: DC
  Administered 2015-04-14 – 2015-04-17 (×11): 25 mg via ORAL
  Filled 2015-04-14 (×12): qty 1

## 2015-04-14 MED ORDER — ACETAMINOPHEN 650 MG RE SUPP: 650.0000 mg | Freq: Four times a day (QID) | RECTAL | Status: DC | PRN

## 2015-04-14 MED ORDER — MAGNESIUM OXIDE 400 (241.3 MG) MG PO TABS
400.0000 mg | ORAL_TABLET | Freq: Every day | ORAL | Status: DC
  Administered 2015-04-14 – 2015-04-18 (×5): 400 mg via ORAL
  Filled 2015-04-14 (×5): qty 1

## 2015-04-14 MED ORDER — FLORANEX PO PACK
1.0000 g | PACK | Freq: Three times a day (TID) | ORAL | Status: DC
  Administered 2015-04-14 – 2015-04-18 (×9): 1 g via ORAL
  Filled 2015-04-14 (×12): qty 1

## 2015-04-14 MED ORDER — POTASSIUM CHLORIDE CRYS ER 20 MEQ PO TBCR
40.0000 meq | EXTENDED_RELEASE_TABLET | Freq: Every day | ORAL | Status: DC
  Administered 2015-04-14 – 2015-04-18 (×5): 40 meq via ORAL
  Filled 2015-04-14 (×5): qty 2

## 2015-04-14 MED ORDER — FENOFIBRATE 160 MG PO TABS
160.0000 mg | ORAL_TABLET | Freq: Every day | ORAL | Status: DC
  Administered 2015-04-14 – 2015-04-18 (×5): 160 mg via ORAL
  Filled 2015-04-14 (×5): qty 1

## 2015-04-14 MED ORDER — ASPIRIN EC 81 MG PO TBEC
81.0000 mg | DELAYED_RELEASE_TABLET | Freq: Every day | ORAL | Status: DC
  Administered 2015-04-14 – 2015-04-18 (×5): 81 mg via ORAL
  Filled 2015-04-14 (×5): qty 1

## 2015-04-14 MED ORDER — CYANOCOBALAMIN 500 MCG PO TABS
500.0000 ug | ORAL_TABLET | Freq: Every day | ORAL | Status: DC
  Administered 2015-04-15 – 2015-04-18 (×4): 500 ug via ORAL
  Filled 2015-04-14 (×4): qty 1

## 2015-04-14 MED ORDER — CARVEDILOL 25 MG PO TABS
25.0000 mg | ORAL_TABLET | Freq: Two times a day (BID) | ORAL | Status: DC
  Administered 2015-04-14 – 2015-04-18 (×8): 25 mg via ORAL
  Filled 2015-04-14 (×8): qty 1

## 2015-04-14 MED ORDER — DULOXETINE HCL 60 MG PO CPEP
60.0000 mg | ORAL_CAPSULE | Freq: Every day | ORAL | Status: DC
  Administered 2015-04-14 – 2015-04-18 (×5): 60 mg via ORAL
  Filled 2015-04-14: qty 1
  Filled 2015-04-14: qty 2
  Filled 2015-04-14 (×3): qty 1

## 2015-04-14 NOTE — Patient Outreach (Signed)
[place] [facility]) Care Management  04/14/2015  TENNILE ESCANDON 09-05-1945 [id number]   Patient noted to have been admitted to the hospital.  [place] will notify hospital liaison of admit.  RN Health Coach will notify physician of health coach closure and patient will be followed by community nurse once discharged.    Jone Baseman, RN, MSN [place] [phone]

## 2015-04-14 NOTE — ED Notes (Signed)
Lactic Acid results called to Dr. Leonides Schanz

## 2015-04-14 NOTE — Progress Notes (Signed)
Utilization review completed.  

## 2015-04-14 NOTE — Progress Notes (Addendum)
Inpatient Diabetes Program Recommendations  AACE/ADA: New Consensus Statement on Inpatient Glycemic Control (2015)  Target Ranges:  Prepandial:   less than 140 mg/dL      Peak postprandial:   less than 180 mg/dL (1-2 hours)      Critically ill patients:  140 - 180 mg/dL   Results for LASHEL, JENTZEN (MRN [id number]) as of 04/14/2015 07:41  Ref. Range 04/14/2015 07:33  Glucose-Capillary Latest Ref Range: 65-99 mg/dL 178 (H)   Review of Glycemic Control  Diabetes history: DM 2 Outpatient Diabetes medications: Lantus 170 listed on home med rec (per last note from Dr. Loanne Drilling pt to start Basaglar 170 units daily) Current orders for Inpatient glycemic control: Lantus 170 units Daily, Novolog Sensitive TID  Note glucose 170s this am without insulin on board.  Inpatient Diabetes Program Recommendations: Insulin - Basal: Patient sees Dr. Ellison(Endocrinologist) as an outpatient. She reported having severe lows at home on home regimen and its reports on home med rec she was not taking her Lantus. While inpatient please reduce basal dose significantly to 20 units Daily. May also consider adding meal coverage if glucose trends upward after meal intake.  Paged Dr. Candiss Norse for adjustments. Waiting for MD review.  Thanks,  Tama Headings RN, MSN, [facility] Inpatient Diabetes Coordinator Team Pager [phone] (8a-5p)

## 2015-04-14 NOTE — Progress Notes (Signed)
Pt refused CPAP. Pt resting on 2L [place], no distress noted. Husband at bedside says she does not wear CPAP at home. Instructed pt and husband to call if RT is needed. Will cont to monitor

## 2015-04-14 NOTE — H&P (Signed)
[facility] History and Physical  Melissa Myers [id number] DOB: 08-06-1945 DOA: 04/13/2015  Referring physician: Dr. Leonides Schanz. PCP: [name], MD  Specialists: Dr. Loanne Drilling. Endocrinologist.  Chief Complaint: Lower extremity swelling and erythema.  HPI: Melissa Myers is a 70 y.o. female with history of diabetes mellitus type 2, CHF, hypertension, hyperlipidemia and OSA presents to the ER because of worsening erythema and swelling over the bilateral lower extremities. Patient states she has been having these symptoms over the last few days. Also had some subjective feeling of fever or chills. On exam patient's both lower extremity is erythematous and edematous. Lactic acid was mildly elevated. Patient was given fluid bolus and started on empiric antibiotics for early sepsis secondary to cellulitis. Patient denies any chest pain or shortness of breath. Patient states her blood sugar has not been well controlled.   Review of Systems: As presented in the history of presenting illness, rest negative.  Past Medical History  Diagnosis Date  . Type II or unspecified type diabetes mellitus without mention of complication, not stated as uncontrolled     insulin dep  . Hyperlipidemia     hx rhabdo on statins  . Hypertension   . Diastolic CHF ([place])   . Chronic diarrhea     a/w nausea - felt related to IBS  . RLS (restless legs syndrome)   . Osteoarthritis   . Stasis dermatitis   . GERD (gastroesophageal reflux disease)   . On home oxygen therapy     uses oxygen 2 liters min per [place] at night and prn during day  . Anemia   . Neuropathy (HCC)     feet, toes and fingers  . Disc degeneration, lumbar   . OSA (obstructive sleep apnea)     05/2009 sleep study - refuses CPAP  . Deaf     left side only  . Shortness of breath     chronic   Past Surgical History  Procedure Laterality Date  . Tubal ligation  1980  . Cholecystectomy  1997  . Uterine polyp removal  2008   . Umbilical hernia repair  1995  . Tonsillectomy  1970  . Colonoscopy N/A 12/03/2012    Procedure: COLONOSCOPY;  Surgeon: Lafayette Dragon, MD;  Location: [facility] ENDOSCOPY;  Service: Endoscopy;  Laterality: N/A;   Social History:  reports that she has never smoked. She has never used smokeless tobacco. She reports that she does not drink alcohol or use illicit drugs. Where does patient live home. Can patient participate in ADLs? Yes.  Allergies  Allergen Reactions  . Cymbalta [Duloxetine Hcl] Other (See Comments)    Restless leg syndrome  . Statins Other (See Comments)    Statin drugs cause muscle pain / "muscle damage"--was told by MD not to take  . Sulfa Antibiotics Diarrhea  . Levemir [Insulin Detemir] Itching  . Morphine Other (See Comments)    GI upset and headaches  . Zinc Swelling and Rash    Family History:  Family History  Problem Relation Age of Onset  . Heart disease Mother   . Heart disease Father   . Heart disease      family history  . Stomach cancer Paternal Grandmother   . Lung cancer Paternal Grandfather   . Colon cancer Neg Hx   . CVA      several aunts  . Heart attack Mother 48  . Heart attack Father 6  . Heart attack      several aunts  and an uncle      Prior to Admission medications   Medication Sig Start Date End Date Taking? Authorizing Provider  aspirin EC 81 MG tablet Take 1 tablet (81 mg total) by mouth daily. 07/08/14   Rowe Clack, MD  B Complex-C (B-COMPLEX WITH VITAMIN C) tablet Take 1 tablet by mouth daily.    Historical Provider, MD  BD INSULIN SYRINGE ULTRAFINE 31G X 15/64" 1 ML MISC USE TO INJECT INSULIN 4 TIMES DAILY AS ADVISED. 12/30/14   Philemon Kingdom, MD  carvedilol (COREG) 25 MG tablet TAKE 1 TABLET BY MOUTH TWICE DAILY WITH FOOD 11/06/14   Jolaine Artist, MD  Cholecalciferol (VITAMIN D3) 2000 UNITS capsule Take 2,000 Units by mouth 4 (four) times daily - after meals and at bedtime.     Historical Provider, MD  Coenzyme Q10  200 MG capsule Take 200 mg by mouth daily.    Historical Provider, MD  diclofenac sodium (VOLTAREN) 1 % GEL Apply 2 g topically daily as needed (knee pain). 10/26/14   Florencia Reasons, MD  DULoxetine (CYMBALTA) 60 MG capsule Take 1 capsule (60 mg total) by mouth daily. 02/02/15 04/23/15  Rowe Clack, MD  ezetimibe (ZETIA) 10 MG tablet Take 1 tablet (10 mg total) by mouth daily. 07/08/14   Rowe Clack, MD  fenofibrate 160 MG tablet TAKE 1 TABLET (160 MG TOTAL) BY MOUTH DAILY. MUST ESTABLISH WITH NEW PCP FOR ADDITIONAL REFILLS. 02/25/15   [name], MD  fluconazole (DIFLUCAN) 150 MG tablet Take 1 tablet (150 mg total) by mouth every 3 (three) days. 03/31/15   [name], MD  gabapentin (NEURONTIN) 300 MG capsule Take 600 mg by mouth 4 (four) times daily.    Historical Provider, MD  glucose blood (ONETOUCH VERIO) test strip Use to check blood sugar 2 times per day. Dx code: E11.9 04/02/15   Renato Shin, MD  hydrALAZINE (APRESOLINE) 25 MG tablet TAKE 1 TABLET (25 MG TOTAL) BY MOUTH 3 (THREE) TIMES DAILY. 10/28/14   Jolaine Artist, MD  hydrOXYzine (ATARAX/VISTARIL) 10 MG tablet Take 10 mg by mouth 3 (three) times daily as needed for anxiety. Reported on 02/10/2015    Historical Provider, MD  Insulin Glargine (LANTUS SOLOSTAR) 100 UNIT/ML Solostar Pen Inject 170 Units into the skin every morning. And pen needles 3/day Patient not taking: Reported on 04/01/2015 03/24/15   Renato Shin, MD  Insulin Pen Needle 32G X 4 MM MISC Use to inject insulin 1 time per day. 03/13/15   Renato Shin, MD  ketoconazole (NIZORAL) 2 % cream Apply 1 application topically daily. Reported on 03/19/2015    Historical Provider, MD  lactobacillus (FLORANEX/LACTINEX) PACK Take 1 packet (1 g total) by mouth 3 (three) times daily with meals. 10/26/14   Florencia Reasons, MD  Magnesium 500 MG TABS Take 500 mg by mouth daily.    Historical Provider, MD  metolazone (ZAROXOLYN) 5 MG tablet TAKE 5mg  (1 tablet) every Wednesday and  Saturday 12/23/14   Larey Dresser, MD  nitrofurantoin (MACRODANTIN) 100 MG capsule Take 1 capsule (100 mg total) by mouth 2 (two) times daily. 03/31/15   [name], MD  nystatin-triamcinolone ointment [facility]) Apply 1 application topically 2 (two) times daily. 03/31/15   [name], MD  omega-3 acid ethyl esters (LOVAZA) 1 G capsule Take 1 g by mouth 2 (two) times daily.     Historical Provider, MD  oxycodone (OXY-IR) 5 MG capsule Take 5 mg by mouth every  6 (six) hours.    Historical Provider, MD  potassium chloride (K-DUR,KLOR-CON) 10 MEQ tablet Take 4 tablets (40 mEq total) by mouth daily. Take an additional 2 tabs on Wednesay and Saturday with Metolazone. 03/12/15   Larey Dresser, MD  rOPINIRole (REQUIP XL) 4 MG 24 hr tablet Take 1 tablet (4 mg total) by mouth at bedtime. Patient not taking: Reported on 04/01/2015 02/03/15   [name], MD  rOPINIRole (REQUIP) 2 MG tablet TAKE 1 TABLET (2 MG TOTAL) BY MOUTH 2 (TWO) TIMES DAILY. (MUST ESTABLISH WITH NEW PCP FOR ADDITIONAL REFILLS.) 02/25/15   [name], MD  spironolactone (ALDACTONE) 25 MG tablet Take 1 tablet (25 mg total) by mouth daily. 07/08/14   Rowe Clack, MD  torsemide (DEMADEX) 100 MG tablet Take 1 tablet (100 mg total) by mouth 2 (two) times daily. 10/10/14   Larey Dresser, MD  triamcinolone cream (KENALOG) 0.1 % Apply 1 application topically 2 (two) times daily as needed (rash). Reported on 03/19/2015    Historical Provider, MD  vitamin B-12 (CYANOCOBALAMIN) 500 MCG tablet Take 500 mcg by mouth daily. Reported on 03/19/2015    Historical Provider, MD    Physical Exam: Filed Vitals:   04/14/15 0300 04/14/15 0345 04/14/15 0415 04/14/15 0500  BP: 115/47 104/44 106/59 111/55  Pulse: 58 55 56 56  Temp:      TempSrc:      Resp: 12 14 14 13   Weight:      SpO2: 98% 98% 97% 98%     General:  Moderately built and nourished.  Eyes: Anicteric. No pallor.  ENT: No discharge from the ears eyes  nose and mouth.  Neck: No mass felt.  Cardiovascular: S1 and S2 heard.  Respiratory: No rhonchi or crepitations.  Abdomen: Soft nontender bowel sounds present.  Skin: Erythema of the both bilateral lower extremity spell.  Musculoskeletal: Bilateral lower extremity edema.  Psychiatric: Appears normal.  Neurologic: Alert awake oriented to time place and person. Moves all extremities.  Labs on Admission:  Basic Metabolic Panel:  Recent Labs Lab 04/13/15 2315  NA 137  K 4.3  CL 102  CO2 24  GLUCOSE 314*  BUN 27*  CREATININE 1.13*  CALCIUM 8.9   Liver Function Tests:  Recent Labs Lab 04/13/15 2315  AST 33  ALT 22  ALKPHOS 75  BILITOT <0.1*  PROT 6.3*  ALBUMIN 3.0*   No results for input(s): LIPASE, AMYLASE in the last 168 hours. No results for input(s): AMMONIA in the last 168 hours. CBC:  Recent Labs Lab 04/13/15 2315  WBC 8.7  NEUTROABS 5.6  HGB 9.3*  HCT 29.4*  MCV 84.0  PLT 259   Cardiac Enzymes: No results for input(s): CKTOTAL, CKMB, CKMBINDEX, TROPONINI in the last 168 hours.  BNP (last 3 results)  Recent Labs  04/13/15 2315  BNP 45.6    ProBNP (last 3 results) No results for input(s): PROBNP in the last 8760 hours.  CBG: No results for input(s): GLUCAP in the last 168 hours.  Radiological Exams on Admission: No results found.   Assessment/Plan Principal Problem:   Bilateral cellulitis of lower leg Active Problems:   Essential hypertension   Chronic diastolic heart failure (HCC)   Sleep apnea   DM (diabetes mellitus), type 2 with renal complications (HCC)   Chronic anemia   Cellulitis   1. Bilateral lower extremity cellulitis with early sepsis - patient was given fluid bolus following which patient's lactic acid level improved. Since  patient has significant lower extremity edema I will hold off any further fluids. Continue with vancomycin and Zosyn follow cultures. Check Dopplers to rule out DVT. 2. Chronic diastolic CHF  last EF measured in 2015 was 65% - patient is on torsemide metolazone and spironolactone which will be continued. 3. Diabetes mellitus type 2 uncontrolled - patient is on large doses of Lantus 170 units in the a.m. Closely follow CBGs. 4. Chronic anemia with mild worsening - follow CBC. 5. Hypertension - on Coreg and hydralazine. 6. OSA on CPAP.   DVT Prophylaxis Lovenox.  Code Status: Full code.  Family Communication: Discussed with patient.  Disposition Plan: Admit to inpatient.    Tailer Volkert N. [facility] Pager [phone].  If 7PM-7AM, please contact night-coverage [email or web address] Password [facility] 04/14/2015, 5:35 AM

## 2015-04-14 NOTE — ED Notes (Signed)
Blood cultures clicked off at [id number] were collected at 2355 04/13/15

## 2015-04-14 NOTE — Evaluation (Signed)
Physical Therapy Evaluation Patient Details Name: Melissa Myers MRN: [id number] DOB: 1946-02-07 Today's Date: 04/14/2015   History of Present Illness  Patient is a 70 y/o female with hx of DM, diastolic CHF, morbid obesity, HTN, HLD presents with bil Lower extremity swelling and erythema. Concern for early sepsis and cellulitis.   Clinical Impression  Patient presents with decreased strength, endurance, balance, pain, dyspnea and fatigue impacting safe mobility. Increased effort with all mobility requiring Min A for balance/safety during transfers. Pt only walks 5' at home with use of rollator. High fall risk. Not able to tolerate walking today. Encouraged OOB to chair for all meals. Would benefit from ST SNF to maximize independence and mobility prior to return home however pt declining at this time. Will follow acutely.     Follow Up Recommendations SNF    Equipment Recommendations  None recommended by PT    Recommendations for Other Services OT consult     Precautions / Restrictions Precautions Precautions: Fall Restrictions Weight Bearing Restrictions: No      Mobility  Bed Mobility Overal bed mobility: Needs Assistance Bed Mobility: Sit to Supine       Sit to supine: Min assist   General bed mobility comments: Assist with bringing BLEs into bed. Increased effort. Plopped into bed. Able to assist scooting self up in bed with rails and cues to plant BLEs.   Transfers Overall transfer level: Needs assistance Equipment used: None Transfers: Sit to/from [facility] Sit to Stand: Min guard Stand pivot transfers: Min assist       General transfer comment: Min guard for safety. Pt needing UE support (used bed or bSC) to stabilize prior to standing. LOB onto bed anteriorly when transferring back to bed. SPT to/from [facility] with Min A and cues.   Ambulation/Gait                Stairs            Wheelchair Mobility    Modified Rankin  (Stroke Patients Only)       Balance Overall balance assessment: Needs assistance Sitting-balance support: Feet supported;No upper extremity supported Sitting balance-Leahy Scale: Fair     Standing balance support: During functional activity;Bilateral upper extremity supported Standing balance-Leahy Scale: Poor Standing balance comment: Reliant on BUEs for support. Able to stand for ~45 sec to change sheet. incontinent of urine when getting to [facility].                              Pertinent Vitals/Pain Pain Assessment: Faces Faces Pain Scale: Hurts even more Pain Location: everywhere Pain Descriptors / Indicators: Sore Pain Intervention(s): Monitored during session;Repositioned;Limited activity within patient's tolerance    Home Living Family/patient expects to be discharged to:: Private residence Living Arrangements: Spouse/significant other Available Help at Discharge: Family;Available 24 hours/day Type of Home: House Home Access: Level entry     Home Layout: One level Home Equipment: Bedside commode;Shower seat - built in;Wheelchair Insurance claims handler - 4 wheels;Walker - 2 wheels;Electric scooter Additional Comments: oxygen    Prior Function Level of Independence: Needs assistance   Gait / Transfers Assistance Needed: Mod I with rollator. Per spouse, pt only walks about 5' at a time. "I have to bring the bathroom to her"  ADL's / Homemaking Assistance Needed: Reports independent with ADLs??  Comments: Prepares meals     Hand Dominance        Extremity/Trunk Assessment   Upper  Extremity Assessment: Defer to OT evaluation;Generalized weakness           Lower Extremity Assessment: Generalized weakness (Erythema, swelling BLEs distal to knee and into foot)         Communication   Communication: No difficulties  Cognition Arousal/Alertness: Awake/alert Behavior During Therapy: WFL for tasks assessed/performed Overall Cognitive Status: Within  Functional Limits for tasks assessed                      General Comments General comments (skin integrity, edema, etc.): Spouse present during session.    Exercises        Assessment/Plan    PT Assessment Patient needs continued PT services  PT Diagnosis Difficulty walking;Generalized weakness;Acute pain   PT Problem List Decreased strength;Pain;Cardiopulmonary status limiting activity;Decreased activity tolerance;Decreased balance;Decreased mobility;Decreased safety awareness  PT Treatment Interventions Balance training;Gait training;Functional mobility training;Therapeutic activities;Therapeutic exercise;Patient/family education;Wheelchair mobility training   PT Goals (Current goals can be found in the Care Plan section) Acute Rehab PT Goals Patient Stated Goal: none stated PT Goal Formulation: With patient Time For Goal Achievement: 04/28/15 Potential to Achieve Goals: Fair    Frequency Min 3X/week   Barriers to discharge        Co-evaluation               End of Session Equipment Utilized During Treatment: Oxygen Activity Tolerance: Patient limited by pain;Patient limited by fatigue Patient left: in bed;with call bell/phone within reach;with family/visitor present;with nursing/sitter in room Nurse Communication: Mobility status         Time: [id number] PT Time Calculation (min) (ACUTE ONLY): 23 min   Charges:   PT Evaluation $PT Eval Moderate Complexity: 1 Procedure PT Treatments $Therapeutic Activity: 8-22 mins   PT G Codes:        Halona Amstutz A Kemontae Dunklee 04/14/2015, 4:49 PM Wray Kearns, [place], DPT [phone]

## 2015-04-14 NOTE — ED Notes (Signed)
Admitting MD at bedside.

## 2015-04-14 NOTE — Progress Notes (Signed)
Pt been sitting up in recliner for about an hour.  No longer complaining of shoulder pain, but intense restless leg pain.  Pt requests to please help her back to bed for a while and then she will attempt to ambulate and back to recliner once she rests for a bit.  Requip administered at this time as well.

## 2015-04-14 NOTE — Progress Notes (Signed)
Patient Demographics:    Melissa Myers, is a 70 y.o. female, DOB - 28-Jul-1945, [id number]  Admit date - 04/13/2015   Admitting Physician [name], MD  Outpatient Primary MD for the patient is [name], MD  LOS - 0   Chief Complaint  Patient presents with  . Leg Swelling        Subjective:    [name] today has, No headache, No chest pain, No abdominal pain - No Nausea, No new weakness tingling or numbness, No Cough - SOB.  Does have bilateral lower extremity burning.   Assessment  & Plan :     1. Bilateral lower extremity discomfort. Question for early sepsis, no leukocytosis, no fever, she does have massive edema, I think most of her symptoms are coming from the massive edema. We will de-escalate antibiotics to doxycycline, continue diuresis, have added Zaroxolyn daily for 3 days to her home regimen, monitor BMP, intake output and clinically. Sepsis physiology has completely resolved. Lower extremity venous duplex pending.  2. Acute on chronic diastolic CHF. Last EF in 2015 is 65%. Continue Demadex, Aldactone at home dose, and it daily Zaroxolyn for 3 days and monitor.  3. Anemia of chronic disease. Stable no acute issues.  4. Essential hypertension. On Coreg and hydralazine along with diuretics. Monitor.  5. Morbid obesity with obstructive sleep apnea. CPAP at night, outpatient follow-up with PCP for weight reduction.  6. CK D3. Baseline creatinine appears to be close to 1.3. Will monitor.  7. DM type II. Insulin-dependent. Lantus 25 twice a day, home dose Lantus was wrong in the chart, sliding scale, check A1c and monitor.  Lab Results  Component Value Date   HGBA1C 8.7* 01/22/2015    CBG (last 3)   Recent Labs  04/14/15 0733  GLUCAP 178*       Code Status : Full  Family Communication  : None  Disposition Plan  : TBD  Consults  :     Procedures  :   lower extremity venous duplex -   DVT Prophylaxis  :  Lovenox   Lab Results  Component Value Date   PLT 268 04/14/2015    Inpatient Medications  Scheduled Meds: . aspirin EC  81 mg Oral Daily  . B-complex with vitamin C  1 tablet Oral Daily  . carvedilol  25 mg Oral BID WC  . Coenzyme Q10  200 mg Oral Daily  . [START ON 04/15/2015] cyanocobalamin  500 mcg Oral Daily  . DULoxetine  60 mg Oral Daily  . enoxaparin (LOVENOX) injection  40 mg Subcutaneous Q24H  . ezetimibe  10 mg Oral Daily  . fenofibrate  160 mg Oral Daily  . gabapentin  600 mg Oral QID  . hydrALAZINE  25 mg Oral 3 times per day  . insulin aspart  0-20 Units Subcutaneous TID WC  . insulin aspart  0-5 Units Subcutaneous QHS  . insulin glargine  25 Units Subcutaneous BID  . lactobacillus  1 g Oral TID WC  . magnesium oxide  400 mg Oral Daily  . metolazone  5 mg Oral Daily  . omega-3 acid ethyl esters  1 g Oral BID  . oxyCODONE  5 mg Oral Q6H  . piperacillin-tazobactam (  ZOSYN)  IV  3.375 g Intravenous Q8H  . potassium chloride  40 mEq Oral Daily  . rOPINIRole  4 mg Oral QHS  . rOPINIRole  2 mg Oral BID  . spironolactone  25 mg Oral Daily  . torsemide  100 mg Oral BID  . [START ON 04/15/2015] vancomycin  1,500 mg Intravenous Q24H  . Vitamin D3  2,000 Units Oral TID PC & HS   Continuous Infusions:  PRN Meds:.acetaminophen **OR** [DISCONTINUED] acetaminophen, hydrOXYzine, [DISCONTINUED] ondansetron **OR** ondansetron (ZOFRAN) IV  Antibiotics  :     Anti-infectives    Start     Dose/Rate Route Frequency Ordered Stop   04/15/15 0000  vancomycin (VANCOCIN) 1,500 mg in sodium chloride 0.9 % 500 mL IVPB     1,500 mg 250 mL/hr over 120 Minutes Intravenous Every 24 hours 04/14/15 0033     04/14/15 0600  piperacillin-tazobactam (ZOSYN) IVPB 3.375 g     3.375 g 12.5 mL/hr over 240 Minutes  Intravenous Every 8 hours 04/14/15 0033     04/14/15 0000  piperacillin-tazobactam (ZOSYN) IVPB 3.375 g     3.375 g 100 mL/hr over 30 Minutes Intravenous  Once 04/13/15 2349 04/14/15 0035   04/14/15 0000  vancomycin (VANCOCIN) 2,000 mg in sodium chloride 0.9 % 500 mL IVPB     2,000 mg 250 mL/hr over 120 Minutes Intravenous  Once 04/13/15 2349 04/14/15 0353        Objective:   Filed Vitals:   04/14/15 0345 04/14/15 0415 04/14/15 0500 04/14/15 0651  BP: 104/44 106/59 111/55 130/58  Pulse: 55 56 56 58  Temp:    97.5 F (36.4 C)  TempSrc:    Oral  Resp: 14 14 13 16   Weight:    130.409 kg (287 lb 8 oz)  SpO2: 98% 97% 98% 98%    Wt Readings from Last 3 Encounters:  04/14/15 130.409 kg (287 lb 8 oz)  04/09/15 119.296 kg (263 lb)  03/19/15 118.933 kg (262 lb 3.2 oz)     Intake/Output Summary (Last 24 hours) at 04/14/15 0934 Last data filed at 04/14/15 0354  Gross per 24 hour  Intake   1500 ml  Output      0 ml  Net   1500 ml     Physical Exam  Awake Alert, Oriented X 3, No new F.N deficits, Normal affect [place].AT,PERRAL Supple Neck,No JVD, No cervical lymphadenopathy appriciated.  Symmetrical Chest wall movement, Good air movement bilaterally, CTAB RRR,No Gallops,Rubs or new Murmurs, No Parasternal Heave +ve B.Sounds, Abd Soft, No tenderness, No organomegaly appriciated, No rebound - guarding or rigidity. No Cyanosis, Clubbing, 3+ leg edema, No new Rash or bruise       Data Review:   Micro Results No results found for this or any previous visit (from the past 240 hour(s)).  Radiology Reports Dg Shoulder Right  04/14/2015  CLINICAL DATA:  Right shoulder joint pain beginning last night. No known injury. EXAM: RIGHT SHOULDER - 2+ VIEW COMPARISON:  None. FINDINGS: Calcification near the greater tuberosity compatible with chronic calcific tendinitis. Degenerative changes at the right [facility] joint. Glenohumeral joint is maintained. No acute bony abnormality. Specifically, no  fracture, subluxation, or dislocation. Soft tissues are intact. IMPRESSION: No acute bony abnormality.  Chronic/calcific tendinitis. Electronically Signed   By: Rolm Baptise M.D.   On: 04/14/2015 08:59     CBC  Recent Labs Lab 04/13/15 2315 04/14/15 0817  WBC 8.7 7.6  HGB 9.3* 9.1*  HCT 29.4* 30.7*  PLT  259 268  MCV 84.0 85.0  MCH 26.6 25.2*  MCHC 31.6 29.6*  RDW 16.1* 16.2*  LYMPHSABS 2.3 2.0  MONOABS 0.6 0.6  EOSABS 0.2 0.1  BASOSABS 0.0 0.0    Chemistries   Recent Labs Lab 04/13/15 2315 04/14/15 0817  NA 137 142  K 4.3 4.9  CL 102 106  CO2 24 27  GLUCOSE 314* 191*  BUN 27* 29*  CREATININE 1.13* 1.35*  CALCIUM 8.9 8.5*  AST 33 31  ALT 22 21  ALKPHOS 75 68  BILITOT <0.1* 0.6   ------------------------------------------------------------------------------------------------------------------ No results for input(s): CHOL, HDL, LDLCALC, TRIG, CHOLHDL, LDLDIRECT in the last 72 hours.  Lab Results  Component Value Date   HGBA1C 8.7* 01/22/2015   ------------------------------------------------------------------------------------------------------------------ No results for input(s): TSH, T4TOTAL, T3FREE, THYROIDAB in the last 72 hours.  Invalid input(s): FREET3 ------------------------------------------------------------------------------------------------------------------ No results for input(s): VITAMINB12, FOLATE, FERRITIN, TIBC, IRON, RETICCTPCT in the last 72 hours.  Coagulation profile No results for input(s): INR, PROTIME in the last 168 hours.  No results for input(s): DDIMER in the last 72 hours.  Cardiac Enzymes No results for input(s): CKMB, TROPONINI, MYOGLOBIN in the last 168 hours.  Invalid input(s): CK ------------------------------------------------------------------------------------------------------------------    Component Value Date/Time   BNP 45.6 04/13/2015 2315    Time Spent in minutes 35   SINGH,PRASHANT K M.D on  04/14/2015 at 9:34 AM  Between 7am to 7pm - Pager - [phone]  After 7pm go to [email or web address] - password [facility]  [facility] -  Office  [phone]

## 2015-04-15 ENCOUNTER — Inpatient Hospital Stay (HOSPITAL_COMMUNITY): Payer: PPO

## 2015-04-15 ENCOUNTER — Ambulatory Visit: Payer: PPO | Admitting: Occupational Therapy

## 2015-04-15 DIAGNOSIS — R609 Edema, unspecified: Secondary | ICD-10-CM | POA: Diagnosis not present

## 2015-04-15 LAB — BASIC METABOLIC PANEL
Anion gap: 9 (ref 5–15)
BUN: 29 mg/dL — ABNORMAL HIGH (ref 6–20)
CHLORIDE: 103 mmol/L (ref 101–111)
CO2: 32 mmol/L (ref 22–32)
CREATININE: 1.25 mg/dL — AB (ref 0.44–1.00)
Calcium: 8.8 mg/dL — ABNORMAL LOW (ref 8.9–10.3)
GFR calc non Af Amer: 43 mL/min — ABNORMAL LOW (ref >60)
GFR, EST AFRICAN AMERICAN: 50 mL/min — AB (ref >60)
Glucose, Bld: 120 mg/dL — ABNORMAL HIGH (ref 65–99)
Potassium: 4.1 mmol/L (ref 3.5–5.1)
Sodium: 144 mmol/L (ref 135–145)

## 2015-04-15 LAB — URINE CULTURE

## 2015-04-15 LAB — GLUCOSE, CAPILLARY
GLUCOSE-CAPILLARY: 120 mg/dL — AB (ref 65–99)
GLUCOSE-CAPILLARY: 184 mg/dL — AB (ref 65–99)
GLUCOSE-CAPILLARY: 223 mg/dL — AB (ref 65–99)
GLUCOSE-CAPILLARY: 277 mg/dL — AB (ref 65–99)

## 2015-04-15 LAB — HEMOGLOBIN A1C
Hgb A1c MFr Bld: 9.6 % — ABNORMAL HIGH (ref 4.8–5.6)
Mean Plasma Glucose: 229 mg/dL

## 2015-04-15 NOTE — Progress Notes (Signed)
Patient has refused use of CPAP at this time. RT will continue to monitor as needed.

## 2015-04-15 NOTE — Progress Notes (Signed)
Patient Demographics:    Melissa Myers, is a 70 y.o. female, DOB - Oct 25, 1945, [id number]  Admit date - 04/13/2015   Admitting Physician [name], MD  Outpatient Primary MD for the patient is [name], MD  LOS - 1   Chief Complaint  Patient presents with  . Leg Swelling        Subjective:    Melissa Myers today has, No headache, No chest pain, No abdominal pain - No Nausea, No new weakness tingling or numbness, No Cough - SOB.  Does have bilateral lower extremity burning.   Assessment  & Plan :   1. Bilateral lower extremity discomfort. Question for early sepsis, no leukocytosis, no fever, she does have massive edema, I think most of her symptoms are coming from the massive edema. We will de-escalate antibiotics to doxycycline, continue diuresis, have added Zaroxolyn daily for 3 days to her home regimen, monitor BMP, intake output and clinically. Sepsis physiology has completely resolved. Lower extremity venous duplex inconclusive. Edema better.  2. Acute on chronic diastolic CHF. Last EF in 2015 is 65%. Continue Demadex, Aldactone at home dose, and it daily Zaroxolyn for 3 days and monitor.  3. Anemia of chronic disease. Stable no acute issues.  4. Essential hypertension. On Coreg and hydralazine along with diuretics. Monitor.  5. Morbid obesity with obstructive sleep apnea. CPAP at night, outpatient follow-up with PCP for weight reduction.  6. CK D3. Baseline creatinine appears to be close to 1.3. Will monitor. Improving with diuresis.  7. DM type II. Insulin-dependent. Lantus 25 twice a day, home dose Lantus was wrong in the chart, sliding scale, check A1c and monitor.  Lab Results  Component Value Date   HGBA1C 9.6* 04/14/2015    CBG (last 3)   Recent  Labs  04/14/15 1641 04/14/15 2152 04/15/15 0549  GLUCAP 268* 247* 120*      Code Status : Full  Family Communication  : None  Disposition Plan  : TBD  Consults  :     Procedures  :   Lower extremity venous duplex - Inconclusive -poor images  DVT Prophylaxis  :  Lovenox   Lab Results  Component Value Date   PLT 268 04/14/2015    Inpatient Medications  Scheduled Meds: . aspirin EC  81 mg Oral Daily  . B-complex with vitamin C  1 tablet Oral Daily  . carvedilol  25 mg Oral BID WC  . cholecalciferol  2,000 Units Oral TID WC & HS  . cyanocobalamin  500 mcg Oral Daily  . doxycycline  100 mg Oral Q12H  . DULoxetine  60 mg Oral Daily  . enoxaparin (LOVENOX) injection  40 mg Subcutaneous Q24H  . ezetimibe  10 mg Oral Daily  . fenofibrate  160 mg Oral Daily  . gabapentin  600 mg Oral QID  . hydrALAZINE  25 mg Oral 3 times per day  . insulin aspart  0-20 Units Subcutaneous TID WC  . insulin aspart  0-5 Units Subcutaneous QHS  . insulin glargine  25 Units Subcutaneous BID  . lactobacillus  1 g Oral TID WC  . magnesium oxide  400 mg Oral Daily  . metolazone  5 mg Oral Daily  . omega-3 acid  ethyl esters  1 g Oral BID  . oxyCODONE  5 mg Oral Q6H  . potassium chloride  40 mEq Oral Daily  . rOPINIRole  2 mg Oral BID  . spironolactone  25 mg Oral Daily  . torsemide  100 mg Oral BID   Continuous Infusions:  PRN Meds:.acetaminophen **OR** [DISCONTINUED] acetaminophen, hydrOXYzine, [DISCONTINUED] ondansetron **OR** ondansetron (ZOFRAN) IV  Antibiotics  :     Anti-infectives    Start     Dose/Rate Route Frequency Ordered Stop   04/15/15 0000  vancomycin (VANCOCIN) 1,500 mg in sodium chloride 0.9 % 500 mL IVPB  Status:  Discontinued     1,500 mg 250 mL/hr over 120 Minutes Intravenous Every 24 hours 04/14/15 0033 04/14/15 0939   04/14/15 1000  doxycycline (VIBRA-TABS) tablet 100 mg     100 mg Oral Every 12 hours 04/14/15 0939     04/14/15 0600  piperacillin-tazobactam  (ZOSYN) IVPB 3.375 g  Status:  Discontinued     3.375 g 12.5 mL/hr over 240 Minutes Intravenous Every 8 hours 04/14/15 0033 04/14/15 0939   04/14/15 0000  piperacillin-tazobactam (ZOSYN) IVPB 3.375 g     3.375 g 100 mL/hr over 30 Minutes Intravenous  Once 04/13/15 2349 04/14/15 0035   04/14/15 0000  vancomycin (VANCOCIN) 2,000 mg in sodium chloride 0.9 % 500 mL IVPB     2,000 mg 250 mL/hr over 120 Minutes Intravenous  Once 04/13/15 2349 04/14/15 0353        Objective:   Filed Vitals:   04/14/15 0500 04/14/15 0651 04/14/15 2155 04/15/15 0507  BP: 111/55 130/58 132/48 118/46  Pulse: 56 58 59 63  Temp:  97.5 F (36.4 C) 98.2 F (36.8 C) 98.6 F (37 C)  TempSrc:  Oral Oral Oral  Resp: 13 16 20 20   Weight:  130.409 kg (287 lb 8 oz)  126.644 kg (279 lb 3.2 oz)  SpO2: 98% 98% 97% 95%    Wt Readings from Last 3 Encounters:  04/15/15 126.644 kg (279 lb 3.2 oz)  04/09/15 119.296 kg (263 lb)  03/19/15 118.933 kg (262 lb 3.2 oz)     Intake/Output Summary (Last 24 hours) at 04/15/15 1003 Last data filed at 04/15/15 0551  Gross per 24 hour  Intake      0 ml  Output   1700 ml  Net  -1700 ml     Physical Exam  Awake Alert, Oriented X 3, No new F.N deficits, Normal affect [place].AT,PERRAL Supple Neck,No JVD, No cervical lymphadenopathy appriciated.  Symmetrical Chest wall movement, Good air movement bilaterally, CTAB RRR,No Gallops,Rubs or new Murmurs, No Parasternal Heave +ve B.Sounds, Abd Soft, No tenderness, No organomegaly appriciated, No rebound - guarding or rigidity. No Cyanosis, Clubbing, 3+ leg edema with stasis discoloration, No new Rash or bruise       Data Review:   Micro Results No results found for this or any previous visit (from the past 240 hour(s)).  Radiology Reports Dg Shoulder Right  04/14/2015  CLINICAL DATA:  Right shoulder joint pain beginning last night. No known injury. EXAM: RIGHT SHOULDER - 2+ VIEW COMPARISON:  None. FINDINGS: Calcification near  the greater tuberosity compatible with chronic calcific tendinitis. Degenerative changes at the right [facility] joint. Glenohumeral joint is maintained. No acute bony abnormality. Specifically, no fracture, subluxation, or dislocation. Soft tissues are intact. IMPRESSION: No acute bony abnormality.  Chronic/calcific tendinitis. Electronically Signed   By: Rolm Baptise M.D.   On: 04/14/2015 08:59     CBC  Recent Labs Lab 04/13/15 2315 04/14/15 0817  WBC 8.7 7.6  HGB 9.3* 9.1*  HCT 29.4* 30.7*  PLT 259 268  MCV 84.0 85.0  MCH 26.6 25.2*  MCHC 31.6 29.6*  RDW 16.1* 16.2*  LYMPHSABS 2.3 2.0  MONOABS 0.6 0.6  EOSABS 0.2 0.1  BASOSABS 0.0 0.0    Chemistries   Recent Labs Lab 04/13/15 2315 04/14/15 0817 04/15/15 0509  NA 137 142 144  K 4.3 4.9 4.1  CL 102 106 103  CO2 24 27 32  GLUCOSE 314* 191* 120*  BUN 27* 29* 29*  CREATININE 1.13* 1.35* 1.25*  CALCIUM 8.9 8.5* 8.8*  AST 33 31  --   ALT 22 21  --   ALKPHOS 75 68  --   BILITOT <0.1* 0.6  --    ------------------------------------------------------------------------------------------------------------------ No results for input(s): CHOL, HDL, LDLCALC, TRIG, CHOLHDL, LDLDIRECT in the last 72 hours.  Lab Results  Component Value Date   HGBA1C 9.6* 04/14/2015   ------------------------------------------------------------------------------------------------------------------ No results for input(s): TSH, T4TOTAL, T3FREE, THYROIDAB in the last 72 hours.  Invalid input(s): FREET3 ------------------------------------------------------------------------------------------------------------------ No results for input(s): VITAMINB12, FOLATE, FERRITIN, TIBC, IRON, RETICCTPCT in the last 72 hours.  Coagulation profile No results for input(s): INR, PROTIME in the last 168 hours.  No results for input(s): DDIMER in the last 72 hours.  Cardiac Enzymes No results for input(s): CKMB, TROPONINI, MYOGLOBIN in the last 168  hours.  Invalid input(s): CK ------------------------------------------------------------------------------------------------------------------    Component Value Date/Time   BNP 45.6 04/13/2015 2315    Time Spent in minutes 35   SINGH,PRASHANT K M.D on 04/15/2015 at 10:03 AM  Between 7am to 7pm - Pager - [phone]  After 7pm go to [email or web address] - password [facility]  [facility] -  Office  [phone]

## 2015-04-15 NOTE — Progress Notes (Signed)
*  Preliminary Results* Bilateral lower extremity venous duplex completed. Study was very technically difficult and limited due to patient body habitus, depth of vessels, and edema. Unable to visualize most lower extremity veins due to technical limitations. Only bilateral saphenofemoral junctions and common femoral veins were adequately visualized, therefore this test is essentially inconclusive.  04/15/2015  Maudry Mayhew, RVT, RDCS, RDMS

## 2015-04-15 NOTE — Progress Notes (Signed)
[facility]  Patient Status: Active (receiving services up to time of hospitalization)  [facility] is providing the following services: RN.  She originally had PT & OT but they have discharged those services.  If she needs therapy at the time of the hospital d/c, please let us know.  If patient discharges after hours, please call [phone].   Hansel Feinstein 04/15/2015, 10:35 AM

## 2015-04-15 NOTE — Consult Note (Signed)
   [facility] CM Inpatient Consult   04/15/2015  MAX WAREN 01/02/1946 [id number]   Patient is active with [facility] program. Martin Majestic to bedside to speak with her to discuss her transitioning back to [facility] for post transition of care discharge hospital calls and potential monthly home visits. She is agreeable to this. Discussed again how [facility] will not interfere or replace services provided by home health. Will request for patient to be assigned to [facility] upon discharge. Will also make inpatient aware that [facility] is following.    Marthenia Rolling, MSN-Ed, RN,BSN [facility] Liaison [phone]

## 2015-04-16 ENCOUNTER — Encounter: Payer: PPO | Admitting: Occupational Therapy

## 2015-04-16 LAB — GLUCOSE, CAPILLARY
GLUCOSE-CAPILLARY: 146 mg/dL — AB (ref 65–99)
GLUCOSE-CAPILLARY: 157 mg/dL — AB (ref 65–99)
Glucose-Capillary: 151 mg/dL — ABNORMAL HIGH (ref 65–99)
Glucose-Capillary: 196 mg/dL — ABNORMAL HIGH (ref 65–99)

## 2015-04-16 LAB — BASIC METABOLIC PANEL
Anion gap: 14 (ref 5–15)
BUN: 35 mg/dL — ABNORMAL HIGH (ref 6–20)
CALCIUM: 9.1 mg/dL (ref 8.9–10.3)
CHLORIDE: 94 mmol/L — AB (ref 101–111)
CO2: 37 mmol/L — AB (ref 22–32)
CREATININE: 1.4 mg/dL — AB (ref 0.44–1.00)
GFR calc non Af Amer: 37 mL/min — ABNORMAL LOW (ref >60)
GFR, EST AFRICAN AMERICAN: 43 mL/min — AB (ref >60)
GLUCOSE: 232 mg/dL — AB (ref 65–99)
Potassium: 4.2 mmol/L (ref 3.5–5.1)
Sodium: 145 mmol/L (ref 135–145)

## 2015-04-16 LAB — MAGNESIUM: Magnesium: 1.9 mg/dL (ref 1.7–2.4)

## 2015-04-16 MED ORDER — AMOXICILLIN-POT CLAVULANATE 500-125 MG PO TABS
1.0000 | ORAL_TABLET | Freq: Three times a day (TID) | ORAL | Status: DC
  Administered 2015-04-16 – 2015-04-18 (×7): 500 mg via ORAL
  Filled 2015-04-16 (×10): qty 1

## 2015-04-16 MED ORDER — DOCUSATE SODIUM 100 MG PO CAPS
200.0000 mg | ORAL_CAPSULE | Freq: Two times a day (BID) | ORAL | Status: DC
  Administered 2015-04-16 – 2015-04-18 (×4): 200 mg via ORAL
  Filled 2015-04-16 (×5): qty 2

## 2015-04-16 MED ORDER — CETYLPYRIDINIUM CHLORIDE 0.05 % MT LIQD
7.0000 mL | Freq: Two times a day (BID) | OROMUCOSAL | Status: DC
  Administered 2015-04-16 – 2015-04-17 (×3): 7 mL via OROMUCOSAL

## 2015-04-16 MED ORDER — INSULIN GLARGINE 100 UNIT/ML ~~LOC~~ SOLN
30.0000 [IU] | Freq: Two times a day (BID) | SUBCUTANEOUS | Status: DC
  Administered 2015-04-16 – 2015-04-18 (×4): 30 [IU] via SUBCUTANEOUS
  Filled 2015-04-16 (×5): qty 0.3

## 2015-04-16 NOTE — Progress Notes (Signed)
CSW met with patient at DC to discuss PT recommendation for SNF.  Pt refusing SNF placement at this time- states that he mom went to one and had a bed experience.  Pt feels confident that he needs can be managed at home with 24 hour help from family- lives with husband, mom, and states daughter can come help as well  Pt was followed by [facility] prior to admission- RNCM informed  CSW signing off  Domenica Reamer, [place] Worker [phone]

## 2015-04-16 NOTE — Progress Notes (Signed)
Patient Demographics:    Melissa Myers, is a 70 y.o. female, DOB - 1945/11/03, [id number]  Admit date - 04/13/2015   Admitting Physician [name], MD  Outpatient Primary MD for the patient is [name], MD  LOS - 2   Chief Complaint  Patient presents with  . Leg Swelling        Subjective:    Melissa Myers today has, No headache, No chest pain, No abdominal pain - No Nausea, No new weakness tingling or numbness, No Cough - SOB.  Does have bilateral lower extremity burning.   Assessment  & Plan :   1. Bilateral lower extremity discomfort. Question for early sepsis, no leukocytosis, no fever, she does have massive edema, I think most of her symptoms are coming from the massive edema. We will de-escalate antibiotics to doxycycline, continue diuresis, now hold Zaroxolyn , monitor BMP, intake output and clinically. Sepsis physiology has completely resolved. Lower extremity venous duplex inconclusive. Edema better, UNA wraps added.  Filed Weights   04/14/15 0651 04/15/15 0507 04/16/15 0548  Weight: 130.409 kg (287 lb 8 oz) 126.644 kg (279 lb 3.2 oz) 124.648 kg (274 lb 12.8 oz)    2. Acute on chronic diastolic CHF. Last EF in 2015 is 65%. Continue Demadex &  Aldactone improving .  3. Anemia of chronic disease. Stable no acute issues.  4. Essential hypertension. On Coreg and hydralazine along with diuretics. Monitor.  5. Morbid obesity with obstructive sleep apnea. CPAP at night, outpatient follow-up with PCP for weight reduction.  6. CK D3. Baseline creatinine appears to be close to 1.3. Will monitor with diuresis.  7. UTI - noted cultures, Augmentin x 3 days.  8. DM type II. Insulin-dependent. Lantus 25 twice a day, home dose Lantus was wrong in the chart, sliding  scale, check A1c and monitor.  Lab Results  Component Value Date   HGBA1C 9.6* 04/14/2015    CBG (last 3)   Recent Labs  04/15/15 1610 04/15/15 2110 04/16/15 0635  GLUCAP 223* 277* 146*      Code Status : Full  Family Communication  : daughter in law  04-15-15  Disposition Plan  : TBD  Consults  :     Procedures  :   Lower extremity venous duplex - Inconclusive -poor images  DVT Prophylaxis  :  Lovenox   Lab Results  Component Value Date   PLT 268 04/14/2015    Inpatient Medications  Scheduled Meds: . amoxicillin-clavulanate  1 tablet Oral 3 times per day  . antiseptic oral rinse  7 mL Mouth Rinse BID  . aspirin EC  81 mg Oral Daily  . B-complex with vitamin C  1 tablet Oral Daily  . carvedilol  25 mg Oral BID WC  . cholecalciferol  2,000 Units Oral TID WC & HS  . cyanocobalamin  500 mcg Oral Daily  . DULoxetine  60 mg Oral Daily  . enoxaparin (LOVENOX) injection  40 mg Subcutaneous Q24H  . ezetimibe  10 mg Oral Daily  . fenofibrate  160 mg Oral Daily  . gabapentin  600 mg Oral QID  . hydrALAZINE  25 mg Oral 3 times per day  . insulin aspart  0-20 Units Subcutaneous TID WC  .  insulin aspart  0-5 Units Subcutaneous QHS  . insulin glargine  25 Units Subcutaneous BID  . lactobacillus  1 g Oral TID WC  . magnesium oxide  400 mg Oral Daily  . omega-3 acid ethyl esters  1 g Oral BID  . oxyCODONE  5 mg Oral Q6H  . potassium chloride  40 mEq Oral Daily  . rOPINIRole  2 mg Oral BID  . spironolactone  25 mg Oral Daily  . torsemide  100 mg Oral BID   Continuous Infusions:  PRN Meds:.acetaminophen **OR** [DISCONTINUED] acetaminophen, hydrOXYzine, [DISCONTINUED] ondansetron **OR** ondansetron (ZOFRAN) IV  Antibiotics  :     Anti-infectives    Start     Dose/Rate Route Frequency Ordered Stop   04/16/15 0730  amoxicillin-clavulanate (AUGMENTIN) 500-125 MG per tablet 500 mg     1 tablet Oral 3 times per day 04/16/15 0715     04/15/15 0000  vancomycin  (VANCOCIN) 1,500 mg in sodium chloride 0.9 % 500 mL IVPB  Status:  Discontinued     1,500 mg 250 mL/hr over 120 Minutes Intravenous Every 24 hours 04/14/15 0033 04/14/15 0939   04/14/15 1000  doxycycline (VIBRA-TABS) tablet 100 mg  Status:  Discontinued     100 mg Oral Every 12 hours 04/14/15 0939 04/16/15 0715   04/14/15 0600  piperacillin-tazobactam (ZOSYN) IVPB 3.375 g  Status:  Discontinued     3.375 g 12.5 mL/hr over 240 Minutes Intravenous Every 8 hours 04/14/15 0033 04/14/15 0939   04/14/15 0000  piperacillin-tazobactam (ZOSYN) IVPB 3.375 g     3.375 g 100 mL/hr over 30 Minutes Intravenous  Once 04/13/15 2349 04/14/15 0035   04/14/15 0000  vancomycin (VANCOCIN) 2,000 mg in sodium chloride 0.9 % 500 mL IVPB     2,000 mg 250 mL/hr over 120 Minutes Intravenous  Once 04/13/15 2349 04/14/15 0353        Objective:   Filed Vitals:   04/15/15 2002 04/15/15 2208 04/16/15 0548 04/16/15 0930  BP: 134/46 135/47 121/43 117/32  Pulse: 67  67 64  Temp: 98.6 F (37 C)  98.8 F (37.1 C) 98.2 F (36.8 C)  TempSrc: Oral  Oral Oral  Resp: 18  18 17   Weight:   124.648 kg (274 lb 12.8 oz)   SpO2: 96%  93% 94%    Wt Readings from Last 3 Encounters:  04/16/15 124.648 kg (274 lb 12.8 oz)  04/09/15 119.296 kg (263 lb)  03/19/15 118.933 kg (262 lb 3.2 oz)     Intake/Output Summary (Last 24 hours) at 04/16/15 1108 Last data filed at 04/16/15 0930  Gross per 24 hour  Intake    120 ml  Output   3220 ml  Net  -3100 ml     Physical Exam  Awake Alert, Oriented X 3, No new F.N deficits, Normal affect [place].AT,PERRAL Supple Neck,No JVD, No cervical lymphadenopathy appriciated.  Symmetrical Chest wall movement, Good air movement bilaterally, CTAB RRR,No Gallops,Rubs or new Murmurs, No Parasternal Heave +ve B.Sounds, Abd Soft, No tenderness, No organomegaly appriciated, No rebound - guarding or rigidity. No Cyanosis, Clubbing, 2+ leg edema with stasis discoloration +ve UNA wraps, No new Rash  or bruise       Data Review:   Micro Results Recent Results (from the past 240 hour(s))  Blood culture (routine x 2)     Status: None (Preliminary result)   Collection Time: 04/13/15 11:45 PM  Result Value Ref Range Status   Specimen Description BLOOD LEFT HAND  Final  Special Requests BOTTLES DRAWN AEROBIC AND ANAEROBIC 5CC   Final   Culture NO GROWTH 1 DAY  Final   Report Status PENDING  Incomplete  Blood culture (routine x 2)     Status: None (Preliminary result)   Collection Time: 04/14/15 12:00 AM  Result Value Ref Range Status   Specimen Description BLOOD RIGHT HAND  Final   Special Requests BOTTLES DRAWN AEROBIC AND ANAEROBIC 5CC  Final   Culture NO GROWTH 1 DAY  Final   Report Status PENDING  Incomplete  Urine culture     Status: None   Collection Time: 04/14/15 12:28 AM  Result Value Ref Range Status   Specimen Description URINE, CLEAN CATCH  Final   Special Requests NONE  Final   Culture   Final    >=100,000 COLONIES/mL AEROCOCCUS URINAE Standardized susceptibility testing for this organism is not available.    Report Status 04/15/2015 FINAL  Final    Radiology Reports Dg Shoulder Right  04/14/2015  CLINICAL DATA:  Right shoulder joint pain beginning last night. No known injury. EXAM: RIGHT SHOULDER - 2+ VIEW COMPARISON:  None. FINDINGS: Calcification near the greater tuberosity compatible with chronic calcific tendinitis. Degenerative changes at the right [facility] joint. Glenohumeral joint is maintained. No acute bony abnormality. Specifically, no fracture, subluxation, or dislocation. Soft tissues are intact. IMPRESSION: No acute bony abnormality.  Chronic/calcific tendinitis. Electronically Signed   By: Rolm Baptise M.D.   On: 04/14/2015 08:59     CBC  Recent Labs Lab 04/13/15 2315 04/14/15 0817  WBC 8.7 7.6  HGB 9.3* 9.1*  HCT 29.4* 30.7*  PLT 259 268  MCV 84.0 85.0  MCH 26.6 25.2*  MCHC 31.6 29.6*  RDW 16.1* 16.2*  LYMPHSABS 2.3 2.0  MONOABS 0.6 0.6   EOSABS 0.2 0.1  BASOSABS 0.0 0.0    Chemistries   Recent Labs Lab 04/13/15 2315 04/14/15 0817 04/15/15 0509 04/16/15 0330  NA 137 142 144 145  K 4.3 4.9 4.1 4.2  CL 102 106 103 94*  CO2 24 27 32 37*  GLUCOSE 314* 191* 120* 232*  BUN 27* 29* 29* 35*  CREATININE 1.13* 1.35* 1.25* 1.40*  CALCIUM 8.9 8.5* 8.8* 9.1  MG  --   --   --  1.9  AST 33 31  --   --   ALT 22 21  --   --   ALKPHOS 75 68  --   --   BILITOT <0.1* 0.6  --   --    ------------------------------------------------------------------------------------------------------------------ No results for input(s): CHOL, HDL, LDLCALC, TRIG, CHOLHDL, LDLDIRECT in the last 72 hours.  Lab Results  Component Value Date   HGBA1C 9.6* 04/14/2015   ------------------------------------------------------------------------------------------------------------------ No results for input(s): TSH, T4TOTAL, T3FREE, THYROIDAB in the last 72 hours.  Invalid input(s): FREET3 ------------------------------------------------------------------------------------------------------------------ No results for input(s): VITAMINB12, FOLATE, FERRITIN, TIBC, IRON, RETICCTPCT in the last 72 hours.  Coagulation profile No results for input(s): INR, PROTIME in the last 168 hours.  No results for input(s): DDIMER in the last 72 hours.  Cardiac Enzymes No results for input(s): CKMB, TROPONINI, MYOGLOBIN in the last 168 hours.  Invalid input(s): CK ------------------------------------------------------------------------------------------------------------------    Component Value Date/Time   BNP 45.6 04/13/2015 2315    Time Spent in minutes 35   SINGH,PRASHANT K M.D on 04/16/2015 at 11:08 AM  Between 7am to 7pm - Pager - [phone]  After 7pm go to [email or web address] - password [facility]  [facility] -  Office  [phone]

## 2015-04-16 NOTE — Progress Notes (Signed)
Physical Therapy Treatment Patient Details Name: Melissa Myers MRN: [id number] DOB: [birth year] Today's Date: 04/16/2015    History of Present Illness Patient is a 70 y/o female with hx of DM, diastolic CHF, morbid obesity, HTN, HLD presents with bil Lower extremity swelling and erythema. Concern for early sepsis and cellulitis.     PT Comments    Patient lethargic today requiring constant verbal stimulus to stay awake and alert. Requires Min A for bed mobility and SPT due to weakness, fatigue and safety. Encouraged up in chair as much as tolerated today. Pt seems confused- stating it was Friday despite earlier conversation with RN prior to PT arrival reorienting to day/month. Will continue to follow.   Follow Up Recommendations  SNF     Equipment Recommendations  None recommended by PT    Recommendations for Other Services       Precautions / Restrictions Precautions Precautions: Fall Restrictions Weight Bearing Restrictions: No    Mobility  Bed Mobility Overal bed mobility: Needs Assistance Bed Mobility: Supine to Sit     Supine to sit: Min assist     General bed mobility comments: Multiple attempts to elevate trunk, heavy use of rails. Min A.   Transfers Overall transfer level: Needs assistance Equipment used: None Transfers: Sit to/from [facility] Sit to Stand: Min guard Stand pivot transfers: Min assist       General transfer comment: Min guard for safety. Pt needing UE support (used bed or bSC) to stabilize prior to standing. Uncontrolled descent into chair. SPT bed to [facility] and BSC to chair with cues. Increased effort.  Ambulation/Gait                 Stairs            Wheelchair Mobility    Modified Rankin (Stroke Patients Only)       Balance Overall balance assessment: Needs assistance Sitting-balance support: Feet supported;No upper extremity supported Sitting balance-Leahy Scale: Fair Sitting balance -  Comments: Lethargic today requiring verbal cues to stay awake and alert.    Standing balance support: During functional activity Standing balance-Leahy Scale: Poor Standing balance comment: Reliant on BUEs for support. Incontinent of urine getting to [facility].                    Cognition Arousal/Alertness: Lethargic Behavior During Therapy: WFL for tasks assessed/performed Overall Cognitive Status: Within Functional Limits for tasks assessed                      Exercises      General Comments        Pertinent Vitals/Pain Pain Assessment: No/denies pain    Home Living                      Prior Function            PT Goals (current goals can now be found in the care plan section) Progress towards PT goals: Progressing toward goals (slowly)    Frequency  Min 3X/week    PT Plan Current plan remains appropriate    Co-evaluation             End of Session Equipment Utilized During Treatment: Oxygen Activity Tolerance: Patient limited by lethargy;Patient limited by fatigue Patient left: in chair;with call bell/phone within reach;with nursing/sitter in room     Time: 0823-0842 PT Time Calculation (min) (ACUTE ONLY): 19 min  Charges:  $Therapeutic Activity:  8-22 mins                    G Codes:      Lacie Draft 04/16/2015, 8:47 AM  Wray Kearns, PT, DPT [phone]

## 2015-04-17 ENCOUNTER — Ambulatory Visit: Payer: PPO | Admitting: Occupational Therapy

## 2015-04-17 LAB — BASIC METABOLIC PANEL
ANION GAP: 12 (ref 5–15)
BUN: 38 mg/dL — ABNORMAL HIGH (ref 6–20)
CHLORIDE: 89 mmol/L — AB (ref 101–111)
CO2: 42 mmol/L — AB (ref 22–32)
CREATININE: 1.38 mg/dL — AB (ref 0.44–1.00)
Calcium: 9.2 mg/dL (ref 8.9–10.3)
GFR calc non Af Amer: 38 mL/min — ABNORMAL LOW (ref >60)
GFR, EST AFRICAN AMERICAN: 44 mL/min — AB (ref >60)
Glucose, Bld: 139 mg/dL — ABNORMAL HIGH (ref 65–99)
Potassium: 3.8 mmol/L (ref 3.5–5.1)
Sodium: 143 mmol/L (ref 135–145)

## 2015-04-17 LAB — GLUCOSE, CAPILLARY
GLUCOSE-CAPILLARY: 148 mg/dL — AB (ref 65–99)
GLUCOSE-CAPILLARY: 143 mg/dL — AB (ref 65–99)
Glucose-Capillary: 104 mg/dL — ABNORMAL HIGH (ref 65–99)
Glucose-Capillary: 176 mg/dL — ABNORMAL HIGH (ref 65–99)

## 2015-04-17 MED ORDER — METOLAZONE 2.5 MG PO TABS
2.5000 mg | ORAL_TABLET | Freq: Once | ORAL | Status: AC
  Administered 2015-04-17: 2.5 mg via ORAL
  Filled 2015-04-17: qty 1

## 2015-04-17 NOTE — Care Management Important Message (Signed)
Important Message  Patient Details  Name: Melissa Myers MRN: [id number] Date of Birth: January 26, 1946   Medicare Important Message Given:  Yes    [name] May 04/17/2015, 1:07 PM

## 2015-04-17 NOTE — Progress Notes (Signed)
Patient Demographics:    Melissa Myers, is a 70 y.o. female, DOB - [birth year], [id number]  Admit date - 04/13/2015   Admitting Physician [name], MD  Outpatient Primary MD for the patient is [name], MD  LOS - 3   Chief Complaint  Patient presents with  . Leg Swelling        Subjective:    Melissa Myers today has, No headache, No chest pain, No abdominal pain - No Nausea, No new weakness tingling or numbness, No Cough - SOB.  Does have bilateral lower extremity burning but better than before.   Assessment  & Plan :   1. Bilateral lower extremity discomfort. Question for early sepsis, no leukocytosis, no fever, she does have massive edema, I think most of her symptoms are coming from the massive edema. We will de-escalate antibiotics to Augmentin, continue diuresis, now hold Zaroxolyn , monitor BMP, intake output and clinically. Sepsis physiology has completely resolved. Lower extremity venous duplex inconclusive. Edema better, UNA wraps added. She is 8.5 L negative by 04/17/2015 and weight is down at least 8-10 pounds from her baseline weight.  Filed Weights   04/15/15 0507 04/16/15 0548 04/17/15 0500  Weight: 126.644 kg (279 lb 3.2 oz) 124.648 kg (274 lb 12.8 oz) 119.8 kg (264 lb 1.8 oz)    2. Acute on chronic diastolic CHF. Last EF in 2015 is 65%. Continue Demadex &  Aldactone improving . Per daughter-in-law patient very noncompliant with fluid and salt restriction, extensively counseled in front of her husband.  3. Anemia of chronic disease. Stable no acute issues.  4. Essential hypertension. On Coreg and hydralazine along with diuretics. Monitor.  5. Morbid obesity with obstructive sleep apnea. CPAP at night, outpatient follow-up with PCP for weight  reduction.  6. CK D3. Baseline creatinine appears to be close to 1.3. Will monitor with diuresis.  7. UTI - noted cultures, Augmentin x 3 days.  8. DM type II. Insulin-dependent. Lantus 25 twice a day, home dose Lantus was wrong in the chart, sliding scale, check A1c and monitor.  Lab Results  Component Value Date   HGBA1C 9.6* 04/14/2015    CBG (last 3)   Recent Labs  04/16/15 1609 04/16/15 2101 04/17/15 0603  GLUCAP 151* 196* 104*      Code Status : Full  Family Communication  : daughter in law  04-15-15, husband bedside 04/17/2015, patient has refused SNF placement, husband aware, discharged home with home health PT on 04/18/2015  Disposition Plan  : Home 2-25  Consults  :     Procedures  :   Lower extremity venous duplex - Inconclusive -poor images  DVT Prophylaxis  :  Lovenox   Lab Results  Component Value Date   PLT 268 04/14/2015    Inpatient Medications  Scheduled Meds: . amoxicillin-clavulanate  1 tablet Oral 3 times per day  . antiseptic oral rinse  7 mL Mouth Rinse BID  . aspirin EC  81 mg Oral Daily  . B-complex with vitamin C  1 tablet Oral Daily  . carvedilol  25 mg Oral BID WC  . cholecalciferol  2,000 Units Oral TID WC & HS  . cyanocobalamin  500 mcg Oral Daily  . docusate sodium  200 mg Oral BID  . DULoxetine  60 mg Oral Daily  . enoxaparin (LOVENOX) injection  40 mg Subcutaneous Q24H  . ezetimibe  10 mg Oral Daily  . fenofibrate  160 mg Oral Daily  . gabapentin  600 mg Oral QID  . hydrALAZINE  25 mg Oral 3 times per day  . insulin aspart  0-20 Units Subcutaneous TID WC  . insulin aspart  0-5 Units Subcutaneous QHS  . insulin glargine  30 Units Subcutaneous BID  . lactobacillus  1 g Oral TID WC  . magnesium oxide  400 mg Oral Daily  . metolazone  2.5 mg Oral Once  . omega-3 acid ethyl esters  1 g Oral BID  . oxyCODONE  5 mg Oral Q6H  . potassium chloride  40 mEq Oral Daily  . rOPINIRole  2 mg Oral BID  . spironolactone  25 mg Oral  Daily  . torsemide  100 mg Oral BID   Continuous Infusions:  PRN Meds:.acetaminophen **OR** [DISCONTINUED] acetaminophen, hydrOXYzine, [DISCONTINUED] ondansetron **OR** ondansetron (ZOFRAN) IV  Antibiotics  :     Anti-infectives    Start     Dose/Rate Route Frequency Ordered Stop   04/16/15 0730  amoxicillin-clavulanate (AUGMENTIN) 500-125 MG per tablet 500 mg     1 tablet Oral 3 times per day 04/16/15 0715     04/15/15 0000  vancomycin (VANCOCIN) 1,500 mg in sodium chloride 0.9 % 500 mL IVPB  Status:  Discontinued     1,500 mg 250 mL/hr over 120 Minutes Intravenous Every 24 hours 04/14/15 0033 04/14/15 0939   04/14/15 1000  doxycycline (VIBRA-TABS) tablet 100 mg  Status:  Discontinued     100 mg Oral Every 12 hours 04/14/15 0939 04/16/15 0715   04/14/15 0600  piperacillin-tazobactam (ZOSYN) IVPB 3.375 g  Status:  Discontinued     3.375 g 12.5 mL/hr over 240 Minutes Intravenous Every 8 hours 04/14/15 0033 04/14/15 0939   04/14/15 0000  piperacillin-tazobactam (ZOSYN) IVPB 3.375 g     3.375 g 100 mL/hr over 30 Minutes Intravenous  Once 04/13/15 2349 04/14/15 0035   04/14/15 0000  vancomycin (VANCOCIN) 2,000 mg in sodium chloride 0.9 % 500 mL IVPB     2,000 mg 250 mL/hr over 120 Minutes Intravenous  Once 04/13/15 2349 04/14/15 0353        Objective:   Filed Vitals:   04/16/15 1430 04/16/15 2044 04/17/15 0500 04/17/15 0508  BP: 121/46 119/45  114/39  Pulse: 58 65  61  Temp: 98.2 F (36.8 C) 98.3 F (36.8 C)  98.5 F (36.9 C)  TempSrc: Oral Oral  Oral  Resp: 17 18  16   Weight:   119.8 kg (264 lb 1.8 oz)   SpO2: 97% 94%  93%    Wt Readings from Last 3 Encounters:  04/17/15 119.8 kg (264 lb 1.8 oz)  04/09/15 119.296 kg (263 lb)  03/19/15 118.933 kg (262 lb 3.2 oz)     Intake/Output Summary (Last 24 hours) at 04/17/15 0931 Last data filed at 04/17/15 0803  Gross per 24 hour  Intake      0 ml  Output   4200 ml  Net  -4200 ml     Physical Exam  Awake Alert,  Oriented X 3, No new F.N deficits, Normal affect [place].AT,PERRAL Supple Neck,No JVD, No cervical lymphadenopathy appriciated.  Symmetrical Chest wall movement, Good air movement bilaterally, CTAB RRR,No Gallops,Rubs or new Murmurs, No Parasternal Heave +ve B.Sounds, Abd Soft, No tenderness, No organomegaly  appriciated, No rebound - guarding or rigidity. No Cyanosis, Clubbing, 2+ leg edema with stasis discoloration +ve UNA wraps, No new Rash or bruise       Data Review:   Micro Results Recent Results (from the past 240 hour(s))  Blood culture (routine x 2)     Status: None (Preliminary result)   Collection Time: 04/13/15 11:45 PM  Result Value Ref Range Status   Specimen Description BLOOD LEFT HAND  Final   Special Requests BOTTLES DRAWN AEROBIC AND ANAEROBIC 5CC   Final   Culture NO GROWTH 2 DAYS  Final   Report Status PENDING  Incomplete  Blood culture (routine x 2)     Status: None (Preliminary result)   Collection Time: 04/14/15 12:00 AM  Result Value Ref Range Status   Specimen Description BLOOD RIGHT HAND  Final   Special Requests BOTTLES DRAWN AEROBIC AND ANAEROBIC 5CC  Final   Culture NO GROWTH 2 DAYS  Final   Report Status PENDING  Incomplete  Urine culture     Status: None   Collection Time: 04/14/15 12:28 AM  Result Value Ref Range Status   Specimen Description URINE, CLEAN CATCH  Final   Special Requests NONE  Final   Culture   Final    >=100,000 COLONIES/mL AEROCOCCUS URINAE Standardized susceptibility testing for this organism is not available.    Report Status 04/15/2015 FINAL  Final    Radiology Reports Dg Shoulder Right  04/14/2015  CLINICAL DATA:  Right shoulder joint pain beginning last night. No known injury. EXAM: RIGHT SHOULDER - 2+ VIEW COMPARISON:  None. FINDINGS: Calcification near the greater tuberosity compatible with chronic calcific tendinitis. Degenerative changes at the right [facility] joint. Glenohumeral joint is maintained. No acute bony abnormality.  Specifically, no fracture, subluxation, or dislocation. Soft tissues are intact. IMPRESSION: No acute bony abnormality.  Chronic/calcific tendinitis. Electronically Signed   By: Rolm Baptise M.D.   On: 04/14/2015 08:59     CBC  Recent Labs Lab 04/13/15 2315 04/14/15 0817  WBC 8.7 7.6  HGB 9.3* 9.1*  HCT 29.4* 30.7*  PLT 259 268  MCV 84.0 85.0  MCH 26.6 25.2*  MCHC 31.6 29.6*  RDW 16.1* 16.2*  LYMPHSABS 2.3 2.0  MONOABS 0.6 0.6  EOSABS 0.2 0.1  BASOSABS 0.0 0.0    Chemistries   Recent Labs Lab 04/13/15 2315 04/14/15 0817 04/15/15 0509 04/16/15 0330 04/17/15 0330  NA 137 142 144 145 143  K 4.3 4.9 4.1 4.2 3.8  CL 102 106 103 94* 89*  CO2 24 27 32 37* 42*  GLUCOSE 314* 191* 120* 232* 139*  BUN 27* 29* 29* 35* 38*  CREATININE 1.13* 1.35* 1.25* 1.40* 1.38*  CALCIUM 8.9 8.5* 8.8* 9.1 9.2  MG  --   --   --  1.9  --   AST 33 31  --   --   --   ALT 22 21  --   --   --   ALKPHOS 75 68  --   --   --   BILITOT <0.1* 0.6  --   --   --    ------------------------------------------------------------------------------------------------------------------ No results for input(s): CHOL, HDL, LDLCALC, TRIG, CHOLHDL, LDLDIRECT in the last 72 hours.  Lab Results  Component Value Date   HGBA1C 9.6* 04/14/2015   ------------------------------------------------------------------------------------------------------------------ No results for input(s): TSH, T4TOTAL, T3FREE, THYROIDAB in the last 72 hours.  Invalid input(s): FREET3 ------------------------------------------------------------------------------------------------------------------ No results for input(s): VITAMINB12, FOLATE, FERRITIN, TIBC, IRON, RETICCTPCT in the last  72 hours.  Coagulation profile No results for input(s): INR, PROTIME in the last 168 hours.  No results for input(s): DDIMER in the last 72 hours.  Cardiac Enzymes No results for input(s): CKMB, TROPONINI, MYOGLOBIN in the last 168 hours.  Invalid  input(s): CK ------------------------------------------------------------------------------------------------------------------    Component Value Date/Time   BNP 45.6 04/13/2015 2315    Time Spent in minutes 35   Lorayne Getchell K M.D on 04/17/2015 at 9:31 AM  Between 7am to 7pm - Pager - [phone]  After 7pm go to [email or web address] - password [facility]  [facility] -  Office  [phone]

## 2015-04-18 LAB — GLUCOSE, CAPILLARY: Glucose-Capillary: 116 mg/dL — ABNORMAL HIGH (ref 65–99)

## 2015-04-18 MED ORDER — AMOXICILLIN-POT CLAVULANATE 500-125 MG PO TABS: 1.0000 | ORAL_TABLET | Freq: Three times a day (TID) | ORAL | Status: DC

## 2015-04-18 NOTE — Discharge Instructions (Signed)
Follow with Primary MD [name], MD in 2-3 days   Get CBC, CMP, 2 view Chest X ray checked  by Primary MD next visit.    Activity: As tolerated with Full fall precautions use walker/cane & assistance as needed   Disposition Home     Diet:   Heart Healthy Low Carb. Check your Weight same time everyday, if you gain over 2 pounds, or you develop in leg swelling, experience more shortness of breath or chest pain, call your Primary MD immediately. Follow Cardiac Low Salt Diet and 1.5 lit/day fluid restriction.  Accuchecks 4 times/day, Once in AM empty stomach and then before each meal. Log in all results and show them to your Prim.MD in 3 days. If any glucose reading is under 80 or above 300 call your Prim MD immidiately. Follow Low glucose instructions for glucose under 80 as instructed.  On your next visit with your primary care physician please Get Medicines reviewed and adjusted.   Please request your Prim.MD to go over all Hospital Tests and Procedure/Radiological results at the follow up, please get all Hospital records sent to your Prim MD by signing hospital release before you go home.   If you experience worsening of your admission symptoms, develop shortness of breath, life threatening emergency, suicidal or homicidal thoughts you must seek medical attention immediately by calling 911 or calling your MD immediately  if symptoms less severe.  You Must read complete instructions/literature along with all the possible adverse reactions/side effects for all the Medicines you take and that have been prescribed to you. Take any new Medicines after you have completely understood and accpet all the possible adverse reactions/side effects.   Do not drive, operating heavy machinery, perform activities at heights, swimming or participation in water activities or provide baby sitting services if your were admitted for syncope or siezures until you have seen by Primary MD or a  Neurologist and advised to do so again.  Do not drive when taking Pain medications.    Do not take more than prescribed Pain, Sleep and Anxiety Medications  Special Instructions: If you have smoked or chewed Tobacco  in the last 2 yrs please stop smoking, stop any regular Alcohol  and or any Recreational drug use.  Wear Seat belts while driving.   Please note  You were cared for by a hospitalist during your hospital stay. If you have any questions about your discharge medications or the care you received while you were in the hospital after you are discharged, you can call the unit and asked to speak with the hospitalist on call if the hospitalist that took care of you is not available. Once you are discharged, your primary care physician will handle any further medical issues. Please note that NO REFILLS for any discharge medications will be authorized once you are discharged, as it is imperative that you return to your primary care physician (or establish a relationship with a primary care physician if you do not have one) for your aftercare needs so that they can reassess your need for medications and monitor your lab values.

## 2015-04-18 NOTE — Discharge Summary (Signed)
Melissa Myers, is a 70 y.o. female  DOB Jun 27, 1945  MRN [id number].  Admission date:  04/13/2015  Admitting Physician  [name], MD  Discharge Date:  04/18/2015   Primary MD  [name], MD  Recommendations for primary care physician for things to follow:   Monitor weight, edema, BMP and diuretic dose closely   Admission Diagnosis  Bilateral lower leg cellulitis [L03.116, L03.115]   Discharge Diagnosis  Bilateral lower leg cellulitis [L03.116, [id number]    Principal Problem:   Bilateral cellulitis of lower leg Active Problems:   Essential hypertension   Chronic diastolic heart failure (HCC)   Sleep apnea   DM (diabetes mellitus), type 2 with renal complications (HCC)   Chronic anemia   Cellulitis      Past Medical History  Diagnosis Date  . Type II or unspecified type diabetes mellitus without mention of complication, not stated as uncontrolled     insulin dep  . Hyperlipidemia     hx rhabdo on statins  . Hypertension   . Diastolic CHF ([place])   . Chronic diarrhea     a/w nausea - felt related to IBS  . RLS (restless legs syndrome)   . Osteoarthritis   . Stasis dermatitis   . GERD (gastroesophageal reflux disease)   . On home oxygen therapy     uses oxygen 2 liters min per [place] at night and prn during day  . Anemia   . Neuropathy (HCC)     feet, toes and fingers  . Disc degeneration, lumbar   . OSA (obstructive sleep apnea)     05/2009 sleep study - refuses CPAP  . Deaf     left side only  . Shortness of breath     chronic  . Cellulitis     LOWER EXTREMITIES    Past Surgical History  Procedure Laterality Date  . Tubal ligation  1980  . Cholecystectomy  1997  . Uterine polyp removal  2008  . Umbilical hernia repair  1995  . Tonsillectomy  1970  . Colonoscopy  N/A 12/03/2012    Procedure: COLONOSCOPY;  Surgeon: Lafayette Dragon, MD;  Location: [facility];  Service: Endoscopy;  Laterality: N/A;       HPI  from the history and physical done on the day of admission:   Melissa Myers is a 70 y.o. female with history of diabetes mellitus type 2, CHF, hypertension, hyperlipidemia and OSA presents to the ER because of worsening erythema and swelling over the bilateral lower extremities. Patient states she has been having these symptoms over the last few days. Also had some subjective feeling of fever or chills. On exam patient's both lower extremity is erythematous and edematous. Lactic acid was mildly elevated. Patient was given fluid bolus and started on empiric antibiotics for early sepsis secondary to cellulitis. Patient denies any chest pain or shortness of breath. Patient states her blood sugar has not been well controlled.     Hospital Course:  1. Bilateral lower extremity discomfort. Question for early sepsis, no leukocytosis, no fever, she does have massive edema, I think most of her symptoms are coming from the massive edema. ? Sepsis physiology has completely resolved. Lower extremity venous duplex inconclusive. Edema better, UNA wraps were added. She is 12 L negative by 04/18/2015 and weight is down at least 12 pounds from her baseline weight. She says her legs are the best in years, main issue is fluid and salt restriction which she ios non complaint with - counseled, DC on home diuretics.  Filed Weights   04/16/15 0548 04/17/15 0500 04/18/15 0429  Weight: 124.648 kg (274 lb 12.8 oz) 119.8 kg (264 lb 1.8 oz) 115.5 kg (254 lb 10.1 oz)     2. Acute on chronic diastolic CHF. Last EF in 2015 is 65%. Continue Demadex & Aldactone improving . Per daughter-in-law patient very noncompliant with fluid and salt restriction, extensively counseled in front of her husband.  3. Anemia of chronic disease. Stable no acute issues.  4. Essential  hypertension. On Coreg and hydralazine along with diuretics. Monitor.  5. Morbid obesity with obstructive sleep apnea. CPAP at night, outpatient follow-up with PCP for weight reduction.  6. CK D3. Baseline creatinine appears to be close to 1.3. PCP to monitor with diuresis.  7. UTI - noted cultures, Augmentin x 3 days. Stable.  8. DM type II. Insulin-dependent. Continue home Rx. PCP to monitor glycemic control.  Lab Results  Component Value Date   HGBA1C 9.6* 04/14/2015    CBG (last 3)   Recent Labs  04/17/15 1629 04/17/15 2115 04/18/15 0609  GLUCAP 148* 143* 116*         Discharge Condition: Fair  Follow UP  Follow-up Information    Follow up with [name], MD. Schedule an appointment as soon as possible for a visit in 3 days.   Specialty:  Internal Medicine   Contact information:   [address] [place] [address] [phone]       Follow up with Loralie Champagne, MD. Schedule an appointment as soon as possible for a visit in 1 week.   Specialty:  Cardiology   Contact information:   [id number] [place] [phone]        Consults obtained - None  Diet and Activity recommendation: See Discharge Instructions below  Discharge Instructions       Discharge Instructions    AMB Referral to [facility]    Complete by:  As directed   Please assign to [facility] for transition of care. Has had 4 admits in past 6 months. Multiple co-morbidities. Currently at [facility]. Currently active with [facility]. Please call with questions. Thanks. Marthenia Rolling, MSN-Ed, [facility] [phone]  Reason for consult:  Please assign to [facility]. Currently active with [facility]  Expected date of contact:  1-3 days (reserved for hospital discharges)     Discharge instructions    Complete by:  As directed   Follow with Primary MD [name], MD in 2-3  days   Get CBC, CMP, 2 view Chest X ray checked  by Primary MD next visit.    Activity: As tolerated with Full fall precautions use walker/cane & assistance as needed   Disposition Home     Diet:   Heart Healthy Low Carb. Check your Weight same time everyday, if you gain over 2 pounds, or you develop  in leg swelling, experience more shortness of breath or chest pain, call your Primary MD immediately. Follow Cardiac Low Salt Diet and 1.5 lit/day fluid restriction.  Accuchecks 4 times/day, Once in AM empty stomach and then before each meal. Log in all results and show them to your Prim.MD in 3 days. If any glucose reading is under 80 or above 300 call your Prim MD immidiately. Follow Low glucose instructions for glucose under 80 as instructed.  On your next visit with your primary care physician please Get Medicines reviewed and adjusted.   Please request your Prim.MD to go over all Hospital Tests and Procedure/Radiological results at the follow up, please get all Hospital records sent to your Prim MD by signing hospital release before you go home.   If you experience worsening of your admission symptoms, develop shortness of breath, life threatening emergency, suicidal or homicidal thoughts you must seek medical attention immediately by calling 911 or calling your MD immediately  if symptoms less severe.  You Must read complete instructions/literature along with all the possible adverse reactions/side effects for all the Medicines you take and that have been prescribed to you. Take any new Medicines after you have completely understood and accpet all the possible adverse reactions/side effects.   Do not drive, operating heavy machinery, perform activities at heights, swimming or participation in water activities or provide baby sitting services if your were admitted for syncope or siezures until you have seen by Primary MD or a Neurologist and advised to do so again.  Do not drive when  taking Pain medications.    Do not take more than prescribed Pain, Sleep and Anxiety Medications  Special Instructions: If you have smoked or chewed Tobacco  in the last 2 yrs please stop smoking, stop any regular Alcohol  and or any Recreational drug use.  Wear Seat belts while driving.   Please note  You were cared for by a hospitalist during your hospital stay. If you have any questions about your discharge medications or the care you received while you were in the hospital after you are discharged, you can call the unit and asked to speak with the hospitalist on call if the hospitalist that took care of you is not available. Once you are discharged, your primary care physician will handle any further medical issues. Please note that NO REFILLS for any discharge medications will be authorized once you are discharged, as it is imperative that you return to your primary care physician (or establish a relationship with a primary care physician if you do not have one) for your aftercare needs so that they can reassess your need for medications and monitor your lab values.     Increase activity slowly    Complete by:  As directed              Discharge Medications       Medication List    STOP taking these medications        nitrofurantoin 100 MG capsule  Commonly known as:  MACRODANTIN      TAKE these medications        amoxicillin-clavulanate 500-125 MG tablet  Commonly known as:  AUGMENTIN  Take 1 tablet (500 mg total) by mouth every 8 (eight) hours.     aspirin EC 81 MG tablet  Take 1 tablet (81 mg total) by mouth daily.     B-complex with vitamin C tablet  Take 1 tablet by mouth daily.  BD INSULIN SYRINGE ULTRAFINE 31G X 15/64" 1 ML Misc  Generic drug:  Insulin Syringe-Needle U-100  USE TO INJECT INSULIN 4 TIMES DAILY AS ADVISED.     carvedilol 25 MG tablet  Commonly known as:  COREG  TAKE 1 TABLET BY MOUTH TWICE DAILY WITH FOOD     Coenzyme Q10 200 MG  capsule  Take 200 mg by mouth daily.     diclofenac sodium 1 % Gel  Commonly known as:  VOLTAREN  Apply 2 g topically daily as needed (knee pain).     DULoxetine 60 MG capsule  Commonly known as:  CYMBALTA  Take 1 capsule (60 mg total) by mouth daily.     ezetimibe 10 MG tablet  Commonly known as:  ZETIA  Take 1 tablet (10 mg total) by mouth daily.     fenofibrate 160 MG tablet  TAKE 1 TABLET (160 MG TOTAL) BY MOUTH DAILY. MUST ESTABLISH WITH NEW PCP FOR ADDITIONAL REFILLS.     fluconazole 150 MG tablet  Commonly known as:  DIFLUCAN  Take 1 tablet (150 mg total) by mouth every 3 (three) days.     gabapentin 300 MG capsule  Commonly known as:  NEURONTIN  Take 600 mg by mouth 4 (four) times daily.     glucose blood test strip  Commonly known as:  ONETOUCH VERIO  Use to check blood sugar 2 times per day. Dx code: E11.9     hydrALAZINE 25 MG tablet  Commonly known as:  APRESOLINE  TAKE 1 TABLET (25 MG TOTAL) BY MOUTH 3 (THREE) TIMES DAILY.     hydrOXYzine 10 MG tablet  Commonly known as:  ATARAX/VISTARIL  Take 10 mg by mouth 3 (three) times daily as needed for anxiety. Reported on 02/10/2015     Insulin Glargine 100 UNIT/ML Solostar Pen  Commonly known as:  LANTUS SOLOSTAR  Inject 170 Units into the skin every morning. And pen needles 3/day     Insulin Pen Needle 32G X 4 MM Misc  Use to inject insulin 1 time per day.     insulin regular 100 units/mL injection  Commonly known as:  NOVOLIN R,HUMULIN R  Inject 50-70 Units into the skin 3 (three) times daily before meals. 70 units for breakfast & lunch 50 units for supper     ketoconazole 2 % cream  Commonly known as:  NIZORAL  Apply 1 application topically daily. Reported on 03/19/2015     lactobacillus Pack  Take 1 packet (1 g total) by mouth 3 (three) times daily with meals.     Magnesium 500 MG Tabs  Take 500 mg by mouth daily.     metolazone 5 MG tablet  Commonly known as:  ZAROXOLYN  TAKE 5mg  (1 tablet) every  Wednesday and Saturday     nystatin-triamcinolone ointment  Commonly known as:  MYCOLOG  Apply 1 application topically 2 (two) times daily.     omega-3 acid ethyl esters 1 g capsule  Commonly known as:  LOVAZA  Take 1 g by mouth 2 (two) times daily.     oxycodone 5 MG capsule  Commonly known as:  OXY-IR  Take 5 mg by mouth every 6 (six) hours.     potassium chloride 10 MEQ tablet  Commonly known as:  K-DUR,KLOR-CON  Take 4 tablets (40 mEq total) by mouth daily. Take an additional 2 tabs on Wednesay and Saturday with Metolazone.     rOPINIRole 4 MG 24 hr tablet  Commonly known as:  REQUIP XL  Take 1 tablet (4 mg total) by mouth at bedtime.     rOPINIRole 2 MG tablet  Commonly known as:  REQUIP  TAKE 1 TABLET (2 MG TOTAL) BY MOUTH 2 (TWO) TIMES DAILY. (MUST ESTABLISH WITH NEW PCP FOR ADDITIONAL REFILLS.)     spironolactone 25 MG tablet  Commonly known as:  ALDACTONE  Take 1 tablet (25 mg total) by mouth daily.     torsemide 100 MG tablet  Commonly known as:  DEMADEX  Take 1 tablet (100 mg total) by mouth 2 (two) times daily.     triamcinolone cream 0.1 %  Commonly known as:  KENALOG  Apply 1 application topically 2 (two) times daily as needed (rash). Reported on 03/19/2015     vitamin B-12 500 MCG tablet  Commonly known as:  CYANOCOBALAMIN  Take 500 mcg by mouth daily. Reported on 03/19/2015     Vitamin D3 2000 units capsule  Take 2,000 Units by mouth 4 (four) times daily - after meals and at bedtime.        Major procedures and Radiology Reports - PLEASE review detailed and final reports for all details, in brief -   Lower extremity venous duplex - Inconclusive -poor images    Dg Shoulder Right  04/14/2015  CLINICAL DATA:  Right shoulder joint pain beginning last night. No known injury. EXAM: RIGHT SHOULDER - 2+ VIEW COMPARISON:  None. FINDINGS: Calcification near the greater tuberosity compatible with chronic calcific tendinitis. Degenerative changes at the right  [facility] joint. Glenohumeral joint is maintained. No acute bony abnormality. Specifically, no fracture, subluxation, or dislocation. Soft tissues are intact. IMPRESSION: No acute bony abnormality.  Chronic/calcific tendinitis. Electronically Signed   By: Rolm Baptise M.D.   On: 04/14/2015 08:59    Micro Results      Recent Results (from the past 240 hour(s))  Blood culture (routine x 2)     Status: None (Preliminary result)   Collection Time: 04/13/15 11:45 PM  Result Value Ref Range Status   Specimen Description BLOOD LEFT HAND  Final   Special Requests BOTTLES DRAWN AEROBIC AND ANAEROBIC 5CC   Final   Culture NO GROWTH 3 DAYS  Final   Report Status PENDING  Incomplete  Blood culture (routine x 2)     Status: None (Preliminary result)   Collection Time: 04/14/15 12:00 AM  Result Value Ref Range Status   Specimen Description BLOOD RIGHT HAND  Final   Special Requests BOTTLES DRAWN AEROBIC AND ANAEROBIC 5CC  Final   Culture NO GROWTH 3 DAYS  Final   Report Status PENDING  Incomplete  Urine culture     Status: None   Collection Time: 04/14/15 12:28 AM  Result Value Ref Range Status   Specimen Description URINE, CLEAN CATCH  Final   Special Requests NONE  Final   Culture   Final    >=100,000 COLONIES/mL AEROCOCCUS URINAE Standardized susceptibility testing for this organism is not available.    Report Status 04/15/2015 FINAL  Final       Today   Subjective    Melissa Myers today has no headache,no chest abdominal pain,no new weakness tingling or numbness, feels much better wants to go home today.     Objective   Blood pressure 125/52, pulse 65, temperature 98.2 F (36.8 C), temperature source Oral, resp. rate 20, weight 115.5 kg (254 lb 10.1 oz), SpO2 92 %.   Intake/Output Summary (Last 24 hours) at 04/18/15 [id number] Last data filed at  04/18/15 0428  Gross per 24 hour  Intake    240 ml  Output   3700 ml  Net  -3460 ml    Exam Awake Alert, Oriented x 3, No new F.N  deficits, Normal affect [place].AT,PERRAL Supple Neck,No JVD, No cervical lymphadenopathy appriciated.  Symmetrical Chest wall movement, Good air movement bilaterally, CTAB RRR,No Gallops,Rubs or new Murmurs, No Parasternal Heave +ve B.Sounds, Abd Soft, Non tender, No organomegaly appriciated, No rebound -guarding or rigidity. No Cyanosis, Clubbing trace edema, chronic leg erythema no warmth, No new Rash or bruise   Data Review   CBC w Diff: Lab Results  Component Value Date   WBC 7.6 04/14/2015   HGB 9.1* 04/14/2015   HCT 30.7* 04/14/2015   PLT 268 04/14/2015   LYMPHOPCT 26 04/14/2015   MONOPCT 8 04/14/2015   EOSPCT 2 04/14/2015   BASOPCT 0 04/14/2015    CMP: Lab Results  Component Value Date   NA 143 04/17/2015   K 3.8 04/17/2015   CL 89* 04/17/2015   CO2 42* 04/17/2015   BUN 38* 04/17/2015   CREATININE 1.38* 04/17/2015   CREATININE 0.97 04/15/2011   PROT 5.9* 04/14/2015   ALBUMIN 2.7* 04/14/2015   BILITOT 0.6 04/14/2015   ALKPHOS 68 04/14/2015   AST 31 04/14/2015   ALT 21 04/14/2015  .   Total Time in preparing paper work, data evaluation and todays exam - 35 minutes  Thurnell Lose M.D on 04/18/2015 at [place]  [phone]

## 2015-04-18 NOTE — Progress Notes (Signed)
D/C teaching completed including reviewed AVS printout, medications still to take today, appointments needed to schedule, d/c instructions - all questions answered. Nursing tech assisted patient via w/c to exit with husband Charm Rings has personal belongings].

## 2015-04-19 DIAGNOSIS — I5042 Chronic combined systolic (congestive) and diastolic (congestive) heart failure: Secondary | ICD-10-CM | POA: Diagnosis not present

## 2015-04-19 DIAGNOSIS — E1142 Type 2 diabetes mellitus with diabetic polyneuropathy: Secondary | ICD-10-CM | POA: Diagnosis not present

## 2015-04-19 DIAGNOSIS — E662 Morbid (severe) obesity with alveolar hypoventilation: Secondary | ICD-10-CM | POA: Diagnosis not present

## 2015-04-19 DIAGNOSIS — M170 Bilateral primary osteoarthritis of knee: Secondary | ICD-10-CM | POA: Diagnosis not present

## 2015-04-19 DIAGNOSIS — Z794 Long term (current) use of insulin: Secondary | ICD-10-CM | POA: Diagnosis not present

## 2015-04-19 DIAGNOSIS — G4733 Obstructive sleep apnea (adult) (pediatric): Secondary | ICD-10-CM | POA: Diagnosis not present

## 2015-04-19 DIAGNOSIS — E1122 Type 2 diabetes mellitus with diabetic chronic kidney disease: Secondary | ICD-10-CM | POA: Diagnosis not present

## 2015-04-19 DIAGNOSIS — L03116 Cellulitis of left lower limb: Secondary | ICD-10-CM | POA: Diagnosis not present

## 2015-04-19 DIAGNOSIS — M47816 Spondylosis without myelopathy or radiculopathy, lumbar region: Secondary | ICD-10-CM | POA: Diagnosis not present

## 2015-04-19 DIAGNOSIS — I130 Hypertensive heart and chronic kidney disease with heart failure and stage 1 through stage 4 chronic kidney disease, or unspecified chronic kidney disease: Secondary | ICD-10-CM | POA: Diagnosis not present

## 2015-04-19 DIAGNOSIS — N186 End stage renal disease: Secondary | ICD-10-CM | POA: Diagnosis not present

## 2015-04-19 DIAGNOSIS — L03115 Cellulitis of right lower limb: Secondary | ICD-10-CM | POA: Diagnosis not present

## 2015-04-19 DIAGNOSIS — Z7982 Long term (current) use of aspirin: Secondary | ICD-10-CM | POA: Diagnosis not present

## 2015-04-19 DIAGNOSIS — Z9981 Dependence on supplemental oxygen: Secondary | ICD-10-CM | POA: Diagnosis not present

## 2015-04-19 DIAGNOSIS — E785 Hyperlipidemia, unspecified: Secondary | ICD-10-CM | POA: Diagnosis not present

## 2015-04-19 DIAGNOSIS — G2581 Restless legs syndrome: Secondary | ICD-10-CM | POA: Diagnosis not present

## 2015-04-19 LAB — CULTURE, BLOOD (ROUTINE X 2)
CULTURE: NO GROWTH
Culture: NO GROWTH

## 2015-04-20 ENCOUNTER — Other Ambulatory Visit: Payer: Self-pay

## 2015-04-20 ENCOUNTER — Telehealth: Payer: Self-pay | Admitting: Internal Medicine

## 2015-04-20 ENCOUNTER — Ambulatory Visit: Payer: PPO | Admitting: Occupational Therapy

## 2015-04-20 DIAGNOSIS — J9690 Respiratory failure, unspecified, unspecified whether with hypoxia or hypercapnia: Secondary | ICD-10-CM | POA: Diagnosis not present

## 2015-04-20 DIAGNOSIS — R21 Rash and other nonspecific skin eruption: Secondary | ICD-10-CM

## 2015-04-20 DIAGNOSIS — I509 Heart failure, unspecified: Secondary | ICD-10-CM | POA: Diagnosis not present

## 2015-04-20 DIAGNOSIS — R0602 Shortness of breath: Secondary | ICD-10-CM | POA: Diagnosis not present

## 2015-04-20 DIAGNOSIS — G4733 Obstructive sleep apnea (adult) (pediatric): Secondary | ICD-10-CM | POA: Diagnosis not present

## 2015-04-20 DIAGNOSIS — I5032 Chronic diastolic (congestive) heart failure: Secondary | ICD-10-CM

## 2015-04-20 NOTE — Telephone Encounter (Signed)
Order placed for home health to start tomorrow.

## 2015-04-20 NOTE — Telephone Encounter (Signed)
Pt's daughter Lanelle Bal called stating pt was just released from the hospital and she is feeling very weak and not able to move very well. She was hoping you could call in some lab work or go over the lab work that was done at the hospital. I was able to get her in to see Dr. Quay Burow on Wednesday. She just wanted to let you know how she is feeling and hoping to get some kind of home health. The hospital tried to get her to a rehab, but pt refused. Daughter tried to talk with social services while she was in the hospital but no one ever came to talk with her. Please advise if there is anything that we can do before she gets in here on Weds.

## 2015-04-20 NOTE — Patient Outreach (Signed)
[facility]) Care Management  04/20/2015  Melissa Myers Dec 27, 1945 [id number]  Transition of care. 70 year old hospitalized 2/20-2/25 with bilateral lower leg cellulitis, heart failure. Member reports she is "just weak". Two identifiers confirmed. Member reports she has an appointment with primary care on Wednesday and also reports she has a follow up appointment scheduled with her cardiologist, but is unable to tell care coordinator the date at this time. Member reports she has transportation. Melissa Myers also reports that a Home health nurse will be coming to see her tomorrow.  Medications reviewed. Member denies any issues with medications. Member reports she is taking Humulin insulin 70 units before breakfast and lunch and 50 units for supper. Member states that she is not taking 170 units of Glargine. RNCM reinforced discharge instructions-accuchecks four times/day, once in the morinng on an empty stomach and then before each meal. Log all results and take to primary care. If any glucose reading is under 80 or above 300 call primary care immediately. Follow low glucose instruction for blood sugar under 80 as instructed. Member verbalized understanding.  Plan: home visit next week.  Thea Silversmith, RN, MSN, [place] Coordinator Cell: [phone]

## 2015-04-21 DIAGNOSIS — E1122 Type 2 diabetes mellitus with diabetic chronic kidney disease: Secondary | ICD-10-CM | POA: Diagnosis not present

## 2015-04-21 DIAGNOSIS — Z794 Long term (current) use of insulin: Secondary | ICD-10-CM | POA: Diagnosis not present

## 2015-04-21 DIAGNOSIS — G4733 Obstructive sleep apnea (adult) (pediatric): Secondary | ICD-10-CM | POA: Diagnosis not present

## 2015-04-21 DIAGNOSIS — E785 Hyperlipidemia, unspecified: Secondary | ICD-10-CM | POA: Diagnosis not present

## 2015-04-21 DIAGNOSIS — I5042 Chronic combined systolic (congestive) and diastolic (congestive) heart failure: Secondary | ICD-10-CM | POA: Diagnosis not present

## 2015-04-21 DIAGNOSIS — E662 Morbid (severe) obesity with alveolar hypoventilation: Secondary | ICD-10-CM | POA: Diagnosis not present

## 2015-04-21 DIAGNOSIS — Z7982 Long term (current) use of aspirin: Secondary | ICD-10-CM | POA: Diagnosis not present

## 2015-04-21 DIAGNOSIS — E1142 Type 2 diabetes mellitus with diabetic polyneuropathy: Secondary | ICD-10-CM | POA: Diagnosis not present

## 2015-04-21 DIAGNOSIS — G2581 Restless legs syndrome: Secondary | ICD-10-CM | POA: Diagnosis not present

## 2015-04-21 DIAGNOSIS — N186 End stage renal disease: Secondary | ICD-10-CM | POA: Diagnosis not present

## 2015-04-21 DIAGNOSIS — I130 Hypertensive heart and chronic kidney disease with heart failure and stage 1 through stage 4 chronic kidney disease, or unspecified chronic kidney disease: Secondary | ICD-10-CM | POA: Diagnosis not present

## 2015-04-21 DIAGNOSIS — L03116 Cellulitis of left lower limb: Secondary | ICD-10-CM | POA: Diagnosis not present

## 2015-04-21 DIAGNOSIS — Z9981 Dependence on supplemental oxygen: Secondary | ICD-10-CM | POA: Diagnosis not present

## 2015-04-21 DIAGNOSIS — L03115 Cellulitis of right lower limb: Secondary | ICD-10-CM | POA: Diagnosis not present

## 2015-04-21 DIAGNOSIS — M47816 Spondylosis without myelopathy or radiculopathy, lumbar region: Secondary | ICD-10-CM | POA: Diagnosis not present

## 2015-04-21 DIAGNOSIS — M170 Bilateral primary osteoarthritis of knee: Secondary | ICD-10-CM | POA: Diagnosis not present

## 2015-04-22 ENCOUNTER — Ambulatory Visit: Payer: PPO | Admitting: Occupational Therapy

## 2015-04-22 ENCOUNTER — Encounter: Payer: Self-pay | Admitting: Internal Medicine

## 2015-04-22 ENCOUNTER — Encounter: Payer: Self-pay | Admitting: Gastroenterology

## 2015-04-22 ENCOUNTER — Other Ambulatory Visit (INDEPENDENT_AMBULATORY_CARE_PROVIDER_SITE_OTHER): Payer: PPO

## 2015-04-22 ENCOUNTER — Ambulatory Visit (INDEPENDENT_AMBULATORY_CARE_PROVIDER_SITE_OTHER): Payer: PPO | Admitting: Internal Medicine

## 2015-04-22 VITALS — BP 114/58 | HR 58 | Temp 97.9°F | Resp 18 | Wt 257.0 lb

## 2015-04-22 DIAGNOSIS — D649 Anemia, unspecified: Secondary | ICD-10-CM

## 2015-04-22 DIAGNOSIS — E1121 Type 2 diabetes mellitus with diabetic nephropathy: Secondary | ICD-10-CM

## 2015-04-22 DIAGNOSIS — R600 Localized edema: Secondary | ICD-10-CM

## 2015-04-22 DIAGNOSIS — G8929 Other chronic pain: Secondary | ICD-10-CM

## 2015-04-22 DIAGNOSIS — G6289 Other specified polyneuropathies: Secondary | ICD-10-CM | POA: Diagnosis not present

## 2015-04-22 DIAGNOSIS — I5032 Chronic diastolic (congestive) heart failure: Secondary | ICD-10-CM

## 2015-04-22 DIAGNOSIS — I10 Essential (primary) hypertension: Secondary | ICD-10-CM

## 2015-04-22 DIAGNOSIS — L03119 Cellulitis of unspecified part of limb: Secondary | ICD-10-CM

## 2015-04-22 DIAGNOSIS — M79606 Pain in leg, unspecified: Secondary | ICD-10-CM

## 2015-04-22 DIAGNOSIS — K625 Hemorrhage of anus and rectum: Secondary | ICD-10-CM

## 2015-04-22 LAB — CBC WITH DIFFERENTIAL/PLATELET
Basophils Absolute: 0 10*3/uL (ref 0.0–0.1)
Basophils Relative: 0.3 % (ref 0.0–3.0)
EOS ABS: 0.3 10*3/uL (ref 0.0–0.7)
Eosinophils Relative: 2.5 % (ref 0.0–5.0)
HCT: 38.8 % (ref 36.0–46.0)
HEMOGLOBIN: 12.7 g/dL (ref 12.0–15.0)
Lymphocytes Relative: 29.2 % (ref 12.0–46.0)
Lymphs Abs: 3 10*3/uL (ref 0.7–4.0)
MCHC: 32.9 g/dL (ref 30.0–36.0)
MCV: 79.5 fl (ref 78.0–100.0)
MONO ABS: 0.7 10*3/uL (ref 0.1–1.0)
Monocytes Relative: 7.2 % (ref 3.0–12.0)
Neutro Abs: 6.3 10*3/uL (ref 1.4–7.7)
Neutrophils Relative %: 60.8 % (ref 43.0–77.0)
Platelets: 443 10*3/uL — ABNORMAL HIGH (ref 150.0–400.0)
RBC: 4.87 Mil/uL (ref 3.87–5.11)
RDW: 16.2 % — ABNORMAL HIGH (ref 11.5–15.5)
WBC: 10.3 10*3/uL (ref 4.0–10.5)

## 2015-04-22 LAB — COMPREHENSIVE METABOLIC PANEL
ALBUMIN: 4 g/dL (ref 3.5–5.2)
ALT: 22 U/L (ref 0–35)
AST: 28 U/L (ref 0–37)
Alkaline Phosphatase: 79 U/L (ref 39–117)
BILIRUBIN TOTAL: 0.4 mg/dL (ref 0.2–1.2)
BUN: 57 mg/dL — AB (ref 6–23)
CHLORIDE: 87 meq/L — AB (ref 96–112)
CO2: 38 mEq/L — ABNORMAL HIGH (ref 19–32)
CREATININE: 1.66 mg/dL — AB (ref 0.40–1.20)
Calcium: 10.5 mg/dL (ref 8.4–10.5)
GFR: 32.48 mL/min — ABNORMAL LOW (ref >60.00)
Glucose, Bld: 141 mg/dL — ABNORMAL HIGH (ref 70–99)
Potassium: 3.8 mEq/L (ref 3.5–5.1)
SODIUM: 136 meq/L (ref 135–145)
Total Protein: 8.3 g/dL (ref 6.0–8.3)

## 2015-04-22 NOTE — Assessment & Plan Note (Signed)
Last colonoscopy 2014 Will refer to GI

## 2015-04-22 NOTE — Assessment & Plan Note (Signed)
Chronic Recheck cbc

## 2015-04-22 NOTE — Assessment & Plan Note (Signed)
BP controlled Continue current medication  

## 2015-04-22 NOTE — Assessment & Plan Note (Signed)
Oxycodone is not working - sometimes takes her mom's hydrocodone Was following with pain management, but they no longer accept her insurance - need new pain management - referral ordered and asked family to help find someone she can see Also taking cymbalta and gabapentin Stressed that she can not take more pain medication than prescribed and can not take her mother's pain medication - discussed that she is high risk of self overdosing accidentally which could be fatal.   Advised her family that she should not be administering her own medication if she can not remember what she is taking Follow up with pcp in 2 weeks to discuss further

## 2015-04-22 NOTE — Progress Notes (Signed)
Subjective:    Patient ID: Ardeen Jourdain, female    DOB: 18-Dec-1945, 70 y.o.   MRN: [id number]  HPI She is here for a hospital follow up.  She is here with her husband, two daughters and son.  She was admitted 2/20 and discharged 2/25.  She was admitted for bilateral lower leg cellulitis and increased edema.  She has hypertension, chronic diastolic heart failure, peripheral neuropathy, sleep apnea, diabetes and chronic anemia.   She went to the ED due to a few days of worsening of erythema and swelling in her bilateral lower extremities.  She had subjective fever and chills.  She had erythema and edema in both legs.  Her lactic acid was mildly elevated.  She was given fluids, started on antibiotics for possible early sepsis.  Her weight was down 12 pounds at discharge.  She had UNA wraps.  Her edema and leg redness improved.  Early sepsis resolved and it was thought most of her issues were related to excessive fluid.  She is not compliant with low sodium and fluid diet.  She was found to have a UTI in the hospital and received augmentin for three days. She is not compliant with a low sugar diet.   Has home health since being discharged.  Her family feels she needs more help.  She lives with her husband and mother.  She can be combative and her husband will often give her what she wants even if she should not have it (bad food).  She is not compliant with a low sugar/carb/sodium diet.  She sugar has been high 100's-300;s.   She takes her pain medication, which she does not feel works and her mother gives her some of her pain  medication.  Today she took 2-3 pain pills - she does not remember.  She is deficating and urinating on herself, which is not [id number].  She is very inactive.   Two days ago she was having difficulty walking, had rectal bleeding with blood in the stool, more groggy, urinary and fecal incontinence.  She is not currently not bleeding. Her last colonoscopy was 2014.  She has  had prior rectal bleeding.   Feet and leg pain, neuropathy:  She has chronic pain.  She is taking oxycodone every 6 hours.  She does not feel it helps.  She takes requip twice day.  She gabapentin 600 mg 4 times a day.  She sometimes takes her mother's hydrocodone.  She has new insurance this year and her pain management doctor does not accept her new insurance.  Sugars:  Her sugars was 300, 194 this morning.  She follows with endocrine.  She is not compliant with a diabetic diet.    No fever, legs cool.  Legs look much better.    Medications and allergies reviewed with patient and updated if appropriate.  Patient Active Problem List   Diagnosis Date Noted  . Bilateral cellulitis of lower leg 04/14/2015  . Chronic anemia 04/14/2015  . Cellulitis 04/14/2015  . Rash and nonspecific skin eruption 04/05/2015  . Dysuria 04/05/2015  . DM (diabetes mellitus), type 2 with renal complications ([place]) [id number]  . Rectal bleeding 03/13/2015  . Acute on chronic diastolic CHF (congestive heart failure), NYHA class 3 ([place]) 01/22/2015  . Acute on chronic diastolic (congestive) heart failure ([place]) 01/22/2015  . Hyperlipidemia 12/23/2014  . AKI (acute kidney injury) ([place]) 11/17/2014  . Cellulitis of left lower extremity 11/17/2014  . Toxic metabolic encephalopathy [id number]  .  Sepsis due to Streptococcus agalactiae ([place]) 10/23/2014  . Open wound of leg 09/23/2014  . Depression 01/16/2013  . Anemia 07/26/2012  . Stasis dermatitis 01/04/2011  . INSOMNIA 08/03/2009  . Chronic diastolic heart failure ([place]) 06/01/2009  . VITAMIN D DEFICIENCY 05/12/2009  . Sleep apnea 04/29/2009  . Dyslipidemia 04/20/2009  . Morbid obesity ([place]) 04/20/2009  . RESTLESS LEGS SYNDROME 04/20/2009  . PERIPHERAL NEUROPATHY 04/20/2009  . Essential hypertension 04/20/2009  . ARTHRITIS 04/20/2009  . BACK PAIN, LUMBAR, CHRONIC 04/20/2009    Current Outpatient Prescriptions on File Prior to Visit  Medication Sig Dispense  Refill  . amoxicillin-clavulanate (AUGMENTIN) 500-125 MG tablet Take 1 tablet (500 mg total) by mouth every 8 (eight) hours. 2 tablet 0  . aspirin EC 81 MG tablet Take 1 tablet (81 mg total) by mouth daily.    . B Complex-C (B-COMPLEX WITH VITAMIN C) tablet Take 1 tablet by mouth daily.    . BD INSULIN SYRINGE ULTRAFINE 31G X 15/64" 1 ML MISC USE TO INJECT INSULIN 4 TIMES DAILY AS ADVISED. 100 each 4  . carvedilol (COREG) 25 MG tablet TAKE 1 TABLET BY MOUTH TWICE DAILY WITH FOOD 60 tablet 5  . Cholecalciferol (VITAMIN D3) 2000 UNITS capsule Take 2,000 Units by mouth 4 (four) times daily - after meals and at bedtime.     . Coenzyme Q10 200 MG capsule Take 200 mg by mouth daily.    . diclofenac sodium (VOLTAREN) 1 % GEL Apply 2 g topically daily as needed (knee pain). 10 g 3  . DULoxetine (CYMBALTA) 60 MG capsule Take 1 capsule (60 mg total) by mouth daily. 90 capsule 1  . ezetimibe (ZETIA) 10 MG tablet Take 1 tablet (10 mg total) by mouth daily. 90 tablet 3  . fenofibrate 160 MG tablet TAKE 1 TABLET (160 MG TOTAL) BY MOUTH DAILY. MUST ESTABLISH WITH NEW PCP FOR ADDITIONAL REFILLS. 90 tablet 3  . gabapentin (NEURONTIN) 300 MG capsule Take 600 mg by mouth 4 (four) times daily.    [name] glucose blood (ONETOUCH VERIO) test strip Use to check blood sugar 2 times per day. Dx code: E11.9 200 each 2  . hydrALAZINE (APRESOLINE) 25 MG tablet TAKE 1 TABLET (25 MG TOTAL) BY MOUTH 3 (THREE) TIMES DAILY. 90 tablet 2  . hydrOXYzine (ATARAX/VISTARIL) 10 MG tablet Take 10 mg by mouth 3 (three) times daily as needed for anxiety. Reported on 02/10/2015    . Insulin Pen Needle 32G X 4 MM MISC Use to inject insulin 1 time per day. 100 each 2  . insulin regular (NOVOLIN R,HUMULIN R) 100 units/mL injection Inject 50-70 Units into the skin 3 (three) times daily before meals. 70 units for breakfast & lunch 50 units for supper    . ketoconazole (NIZORAL) 2 % cream Apply 1 application topically daily. Reported on 03/19/2015    .  lactobacillus (FLORANEX/LACTINEX) PACK Take 1 packet (1 g total) by mouth 3 (three) times daily with meals. 30 packet 0  . Magnesium 500 MG TABS Take 500 mg by mouth daily.    . metolazone (ZAROXOLYN) 5 MG tablet TAKE 5mg  (1 tablet) every Wednesday and Saturday 15 tablet 3  . nystatin-triamcinolone ointment (MYCOLOG) Apply 1 application topically 2 (two) times daily. 60 g 2  . omega-3 acid ethyl esters (LOVAZA) 1 G capsule Take 1 g by mouth 2 (two) times daily.     [name] oxycodone (OXY-IR) 5 MG capsule Take 5 mg by mouth every 6 (six) hours.    [name]  potassium chloride (K-DUR,KLOR-CON) 10 MEQ tablet Take 4 tablets (40 mEq total) by mouth daily. Take an additional 2 tabs on Wednesay and Saturday with Metolazone. (Patient taking differently: Take 40-60 mEq by mouth daily. Take an additional 2 tabs on Wednesay and Saturday with Metolazone.) 130 tablet 3  . rOPINIRole (REQUIP) 2 MG tablet TAKE 1 TABLET (2 MG TOTAL) BY MOUTH 2 (TWO) TIMES DAILY. (MUST ESTABLISH WITH NEW PCP FOR ADDITIONAL REFILLS.) 180 tablet 3  . spironolactone (ALDACTONE) 25 MG tablet Take 1 tablet (25 mg total) by mouth daily. 90 tablet 3  . torsemide (DEMADEX) 100 MG tablet Take 1 tablet (100 mg total) by mouth 2 (two) times daily. 60 tablet 6  . triamcinolone cream (KENALOG) 0.1 % Apply 1 application topically 2 (two) times daily as needed (rash). Reported on 03/19/2015    . vitamin B-12 (CYANOCOBALAMIN) 500 MCG tablet Take 500 mcg by mouth daily. Reported on 03/19/2015     No current facility-administered medications on file prior to visit.    Past Medical History  Diagnosis Date  . Type II or unspecified type diabetes mellitus without mention of complication, not stated as uncontrolled     insulin dep  . Hyperlipidemia     hx rhabdo on statins  . Hypertension   . Diastolic CHF ([place])   . Chronic diarrhea     a/w nausea - felt related to IBS  . RLS (restless legs syndrome)   . Osteoarthritis   . Stasis dermatitis   . GERD  (gastroesophageal reflux disease)   . On home oxygen therapy     uses oxygen 2 liters min per [place] at night and prn during day  . Anemia   . Neuropathy ([facility])     feet, toes and fingers  . Disc degeneration, lumbar   . OSA (obstructive sleep apnea)     05/2009 sleep study - refuses CPAP  . Deaf     left side only  . Shortness of breath     chronic  . Cellulitis     LOWER EXTREMITIES    Past Surgical History  Procedure Laterality Date  . Tubal ligation  1980  . Cholecystectomy  1997  . Uterine polyp removal  2008  . Umbilical hernia repair  1995  . Tonsillectomy  1970  . Colonoscopy N/A 12/03/2012    Procedure: COLONOSCOPY;  Surgeon: Lafayette Dragon, MD;  Location: [facility];  Service: Endoscopy;  Laterality: N/A;    Social History   Social History  . Marital Status: Married    Spouse Name: N/A  . Number of Children: N/A  . Years of Education: N/A   Social History Main Topics  . Smoking status: Never Smoker   . Smokeless tobacco: Never Used  . Alcohol Use: No  . Drug Use: No  . Sexual Activity: No   Other Topics Concern  . Not on file   Social History Narrative   Lives with spouse and mother. Retired Network engineer, now housewife    Family History  Problem Relation Age of Onset  . Heart disease Mother   . Heart disease Father   . Heart disease      family history  . Stomach cancer Paternal Grandmother   . Lung cancer Paternal Grandfather   . Colon cancer Neg Hx   . CVA      several aunts  . Heart attack Mother 34  . Heart attack Father 67  . Heart attack      several  aunts and an uncle    Review of Systems  Constitutional: Negative for fever, chills and appetite change.  Respiratory: Positive for shortness of breath (with activity). Negative for cough and wheezing.   Cardiovascular: Positive for leg swelling (chronic - improved). Negative for chest pain and palpitations.  Gastrointestinal: Positive for blood in stool. Negative for nausea and abdominal  pain.       Fecal incontinence  Genitourinary:       Incontinence  Neurological: Negative for light-headedness and headaches.  Psychiatric/Behavioral: Positive for sleep disturbance.       Objective:   Filed Vitals:   04/22/15 1102  BP: 114/58  Pulse: 58  Temp: 97.9 F (36.6 C)  Resp: 18   Filed Weights   04/22/15 1102  Weight: 257 lb (116.574 kg)   Body mass index is 46.99 kg/(m^2).   Physical Exam Constitutional: obese, falling asleep intermittently during the visit. No distress.  Neck: Neck supple. No tracheal deviation present. No thyromegaly present.  No cervical adenopathy.   Cardiovascular: Normal rate, regular rhythm and normal heart sounds.   No murmur heard.  1+ bilateral LE edema, feet swelling b/l Pulmonary/Chest: Effort normal and breath sounds normal. No respiratory distress. No wheezes.  Abdomen:  Obese, soft, nontender Skin: hyperpigmentation/chronic venous b/l LE, erythema in feet b/l, no foot lesions or breaks in skin, no pain in legs or feet       Assessment & Plan:   See Problem List for Assessment and Plan of chronic medical problems.   Has f/u appt with pcp later this month

## 2015-04-22 NOTE — Assessment & Plan Note (Signed)
B/l LE cellulitis improved Still chronic edema Still has some erythema and swelling in feet - need to monitor  Stressed low sodium diet, elevating legs when able, being more active

## 2015-04-22 NOTE — Assessment & Plan Note (Signed)
Chronic leg edema Taking torsemide 100 mg twice daily Check cmp Stressed compliance with a low sodium diet

## 2015-04-22 NOTE — Patient Instructions (Signed)
  We have reviewed your prior records including labs and tests today.  Test(s) ordered today. Your results will be released to [facility] (or called to you) after review, usually within 72hours after test completion. If any changes need to be made, you will be notified at that same time.   Medications reviewed and updated.  No changes recommended at this time.  You need to take your medication only as prescribed and not take anyone else's medication.    A referral was ordered for pain management and gastroenterology.  Please schedule followup in Dr Sharlet Salina soon.

## 2015-04-22 NOTE — Progress Notes (Signed)
Pre visit review using our clinic review tool, if applicable. No additional management support is needed unless otherwise documented below in the visit note. 

## 2015-04-22 NOTE — Assessment & Plan Note (Signed)
Not complaint with a diabetic diet, very inactive Management by endocrine Stressed importance of diabetic diet and better control of her diabetes

## 2015-04-23 NOTE — Telephone Encounter (Signed)
Opened in error

## 2015-04-24 ENCOUNTER — Telehealth: Payer: Self-pay | Admitting: Endocrinology

## 2015-04-24 ENCOUNTER — Ambulatory Visit: Payer: PPO | Admitting: Occupational Therapy

## 2015-04-24 DIAGNOSIS — Z794 Long term (current) use of insulin: Secondary | ICD-10-CM | POA: Diagnosis not present

## 2015-04-24 DIAGNOSIS — E662 Morbid (severe) obesity with alveolar hypoventilation: Secondary | ICD-10-CM | POA: Diagnosis not present

## 2015-04-24 DIAGNOSIS — L03116 Cellulitis of left lower limb: Secondary | ICD-10-CM | POA: Diagnosis not present

## 2015-04-24 DIAGNOSIS — E1142 Type 2 diabetes mellitus with diabetic polyneuropathy: Secondary | ICD-10-CM | POA: Diagnosis not present

## 2015-04-24 DIAGNOSIS — I130 Hypertensive heart and chronic kidney disease with heart failure and stage 1 through stage 4 chronic kidney disease, or unspecified chronic kidney disease: Secondary | ICD-10-CM | POA: Diagnosis not present

## 2015-04-24 DIAGNOSIS — Z7982 Long term (current) use of aspirin: Secondary | ICD-10-CM | POA: Diagnosis not present

## 2015-04-24 DIAGNOSIS — I300 Acute nonspecific idiopathic pericarditis: Secondary | ICD-10-CM | POA: Diagnosis not present

## 2015-04-24 DIAGNOSIS — I5042 Chronic combined systolic (congestive) and diastolic (congestive) heart failure: Secondary | ICD-10-CM | POA: Diagnosis not present

## 2015-04-24 DIAGNOSIS — G4733 Obstructive sleep apnea (adult) (pediatric): Secondary | ICD-10-CM | POA: Diagnosis not present

## 2015-04-24 DIAGNOSIS — M170 Bilateral primary osteoarthritis of knee: Secondary | ICD-10-CM | POA: Diagnosis not present

## 2015-04-24 DIAGNOSIS — E1122 Type 2 diabetes mellitus with diabetic chronic kidney disease: Secondary | ICD-10-CM | POA: Diagnosis not present

## 2015-04-24 DIAGNOSIS — M47816 Spondylosis without myelopathy or radiculopathy, lumbar region: Secondary | ICD-10-CM | POA: Diagnosis not present

## 2015-04-24 DIAGNOSIS — L03115 Cellulitis of right lower limb: Secondary | ICD-10-CM | POA: Diagnosis not present

## 2015-04-24 DIAGNOSIS — Z9981 Dependence on supplemental oxygen: Secondary | ICD-10-CM | POA: Diagnosis not present

## 2015-04-24 DIAGNOSIS — N186 End stage renal disease: Secondary | ICD-10-CM | POA: Diagnosis not present

## 2015-04-24 DIAGNOSIS — G2581 Restless legs syndrome: Secondary | ICD-10-CM | POA: Diagnosis not present

## 2015-04-24 DIAGNOSIS — E785 Hyperlipidemia, unspecified: Secondary | ICD-10-CM | POA: Diagnosis not present

## 2015-04-24 MED ORDER — INSULIN NPH (HUMAN) (ISOPHANE) 100 UNIT/ML ~~LOC~~ SUSP: 150.0000 [IU] | SUBCUTANEOUS | Status: DC

## 2015-04-24 NOTE — Telephone Encounter (Signed)
Beth with [facility] # [phone] please call back regarding lantus dosage and the pt's confusion of what to take and how much

## 2015-04-24 NOTE — Telephone Encounter (Signed)
Please change to NPH, 150 units qam i have sent a prescription to [facility] Please call next week, to tell us how the blood sugar is doing, as we will likely need to adjust it.

## 2015-04-24 NOTE — Telephone Encounter (Signed)
I contacted Melissa Myers and she stated the pt has not been taking the basaglar or lantus because the price of the medication. Pt has been only taking Novolin R 50 to 70 units tid depending on her meals. Her blood sugar has been ranging between 200 and 300.  Please advise, Thanks!

## 2015-04-24 NOTE — Telephone Encounter (Signed)
I contacted the pt and advised of note below. Pt voiced understanding.  

## 2015-04-24 NOTE — Addendum Note (Signed)
Addended by: Renato Shin on: 04/24/2015 04:02 PM   Modules accepted: Orders, Medications

## 2015-04-27 ENCOUNTER — Ambulatory Visit: Payer: PPO | Admitting: Occupational Therapy

## 2015-04-28 ENCOUNTER — Encounter (HOSPITAL_COMMUNITY): Payer: PPO

## 2015-04-28 ENCOUNTER — Telehealth: Payer: Self-pay | Admitting: Emergency Medicine

## 2015-04-28 ENCOUNTER — Other Ambulatory Visit (HOSPITAL_COMMUNITY): Payer: Self-pay | Admitting: Internal Medicine

## 2015-04-28 DIAGNOSIS — G4733 Obstructive sleep apnea (adult) (pediatric): Secondary | ICD-10-CM | POA: Diagnosis not present

## 2015-04-28 DIAGNOSIS — G2581 Restless legs syndrome: Secondary | ICD-10-CM | POA: Diagnosis not present

## 2015-04-28 DIAGNOSIS — E662 Morbid (severe) obesity with alveolar hypoventilation: Secondary | ICD-10-CM | POA: Diagnosis not present

## 2015-04-28 DIAGNOSIS — M170 Bilateral primary osteoarthritis of knee: Secondary | ICD-10-CM | POA: Diagnosis not present

## 2015-04-28 DIAGNOSIS — E785 Hyperlipidemia, unspecified: Secondary | ICD-10-CM | POA: Diagnosis not present

## 2015-04-28 DIAGNOSIS — Z794 Long term (current) use of insulin: Secondary | ICD-10-CM | POA: Diagnosis not present

## 2015-04-28 DIAGNOSIS — E1142 Type 2 diabetes mellitus with diabetic polyneuropathy: Secondary | ICD-10-CM | POA: Diagnosis not present

## 2015-04-28 DIAGNOSIS — L03115 Cellulitis of right lower limb: Secondary | ICD-10-CM | POA: Diagnosis not present

## 2015-04-28 DIAGNOSIS — L03116 Cellulitis of left lower limb: Secondary | ICD-10-CM | POA: Diagnosis not present

## 2015-04-28 DIAGNOSIS — Z7982 Long term (current) use of aspirin: Secondary | ICD-10-CM | POA: Diagnosis not present

## 2015-04-28 DIAGNOSIS — I5042 Chronic combined systolic (congestive) and diastolic (congestive) heart failure: Secondary | ICD-10-CM | POA: Diagnosis not present

## 2015-04-28 DIAGNOSIS — Z9981 Dependence on supplemental oxygen: Secondary | ICD-10-CM | POA: Diagnosis not present

## 2015-04-28 DIAGNOSIS — N186 End stage renal disease: Secondary | ICD-10-CM | POA: Diagnosis not present

## 2015-04-28 DIAGNOSIS — M47816 Spondylosis without myelopathy or radiculopathy, lumbar region: Secondary | ICD-10-CM | POA: Diagnosis not present

## 2015-04-28 DIAGNOSIS — I130 Hypertensive heart and chronic kidney disease with heart failure and stage 1 through stage 4 chronic kidney disease, or unspecified chronic kidney disease: Secondary | ICD-10-CM | POA: Diagnosis not present

## 2015-04-28 DIAGNOSIS — E1122 Type 2 diabetes mellitus with diabetic chronic kidney disease: Secondary | ICD-10-CM | POA: Diagnosis not present

## 2015-04-28 NOTE — Telephone Encounter (Signed)
See result note from 3/1 - she is supposed to have repeat cmp

## 2015-04-28 NOTE — Telephone Encounter (Signed)
Daughter Lanelle Bal called to find out about lab results. I do not see where pt came back in for labs. Please advise if you are able to find anything.

## 2015-04-29 ENCOUNTER — Ambulatory Visit: Payer: PPO | Admitting: Occupational Therapy

## 2015-04-29 ENCOUNTER — Ambulatory Visit (HOSPITAL_COMMUNITY)
Admission: RE | Admit: 2015-04-29 | Discharge: 2015-04-29 | Disposition: A | Discharge status: Home / Self Care | Payer: PPO | Source: Ambulatory Visit | Attending: Internal Medicine | Admitting: Internal Medicine

## 2015-04-29 ENCOUNTER — Other Ambulatory Visit: Payer: Self-pay

## 2015-04-29 ENCOUNTER — Ambulatory Visit: Payer: Self-pay

## 2015-04-29 VITALS — BP 114/68 | HR 76 | Wt 258.4 lb

## 2015-04-29 DIAGNOSIS — I5032 Chronic diastolic (congestive) heart failure: Secondary | ICD-10-CM | POA: Insufficient documentation

## 2015-04-29 DIAGNOSIS — G6289 Other specified polyneuropathies: Secondary | ICD-10-CM | POA: Diagnosis not present

## 2015-04-29 DIAGNOSIS — E781 Pure hyperglyceridemia: Secondary | ICD-10-CM | POA: Insufficient documentation

## 2015-04-29 DIAGNOSIS — G2581 Restless legs syndrome: Secondary | ICD-10-CM | POA: Diagnosis not present

## 2015-04-29 DIAGNOSIS — Z79899 Other long term (current) drug therapy: Secondary | ICD-10-CM | POA: Insufficient documentation

## 2015-04-29 DIAGNOSIS — Z7982 Long term (current) use of aspirin: Secondary | ICD-10-CM | POA: Insufficient documentation

## 2015-04-29 DIAGNOSIS — E662 Morbid (severe) obesity with alveolar hypoventilation: Secondary | ICD-10-CM | POA: Diagnosis not present

## 2015-04-29 DIAGNOSIS — I10 Essential (primary) hypertension: Secondary | ICD-10-CM

## 2015-04-29 DIAGNOSIS — G4730 Sleep apnea, unspecified: Secondary | ICD-10-CM

## 2015-04-29 DIAGNOSIS — I110 Hypertensive heart disease with heart failure: Secondary | ICD-10-CM | POA: Insufficient documentation

## 2015-04-29 DIAGNOSIS — I5033 Acute on chronic diastolic (congestive) heart failure: Secondary | ICD-10-CM

## 2015-04-29 DIAGNOSIS — E785 Hyperlipidemia, unspecified: Secondary | ICD-10-CM | POA: Insufficient documentation

## 2015-04-29 DIAGNOSIS — Z794 Long term (current) use of insulin: Secondary | ICD-10-CM | POA: Insufficient documentation

## 2015-04-29 DIAGNOSIS — Z8249 Family history of ischemic heart disease and other diseases of the circulatory system: Secondary | ICD-10-CM | POA: Insufficient documentation

## 2015-04-29 DIAGNOSIS — Z823 Family history of stroke: Secondary | ICD-10-CM | POA: Diagnosis not present

## 2015-04-29 DIAGNOSIS — E1142 Type 2 diabetes mellitus with diabetic polyneuropathy: Secondary | ICD-10-CM | POA: Diagnosis not present

## 2015-04-29 LAB — BASIC METABOLIC PANEL
Anion gap: 15 (ref 5–15)
BUN: 60 mg/dL — ABNORMAL HIGH (ref 6–20)
CALCIUM: 9.6 mg/dL (ref 8.9–10.3)
CO2: 31 mmol/L (ref 22–32)
CREATININE: 1.47 mg/dL — AB (ref 0.44–1.00)
Chloride: 91 mmol/L — ABNORMAL LOW (ref 101–111)
GFR calc non Af Amer: 35 mL/min — ABNORMAL LOW (ref >60)
GFR, EST AFRICAN AMERICAN: 41 mL/min — AB (ref >60)
Glucose, Bld: 195 mg/dL — ABNORMAL HIGH (ref 65–99)
Potassium: 3.4 mmol/L — ABNORMAL LOW (ref 3.5–5.1)
SODIUM: 137 mmol/L (ref 135–145)

## 2015-04-29 LAB — BRAIN NATRIURETIC PEPTIDE: B Natriuretic Peptide: 24.9 pg/mL (ref 0.0–100.0)

## 2015-04-29 MED ORDER — OMEGA-3-ACID ETHYL ESTERS 1 G PO CAPS: 1.0000 g | ORAL_CAPSULE | Freq: Two times a day (BID) | ORAL | Status: DC

## 2015-04-29 MED ORDER — GABAPENTIN 300 MG PO CAPS: 600.0000 mg | ORAL_CAPSULE | Freq: Four times a day (QID) | ORAL | Status: DC

## 2015-04-29 NOTE — Progress Notes (Signed)
Advanced Heart Failure Medication Review by a Pharmacist  Does the patient  feel that his/her medications are working for him/her?  yes  Has the patient been experiencing any side effects to the medications prescribed?  no  Does the patient measure his/her own blood pressure or blood glucose at home?  no   Does the patient have any problems obtaining medications due to transportation or finances?   no  Understanding of regimen: fair Understanding of indications: fair Potential of compliance: fair Patient understands to avoid NSAIDs. Patient understands to avoid decongestants.  Issues to address at subsequent visits: Adherence to HF meds (carvedilol)   Pharmacist comments: 70 YO female presenting with her husband for HF followup.  Pt states that she is in pain with neuropathy in her legs.  Reviewed [facility] Medication list and pt states home health nurse fills pill box for patient. Pt only taking Humulin R with no basal insulin. Per pt and medication list patient is taking Metolazone 2.5 mg on wed and sat, Potassium 40 meq BID (60 meq BID on wed/sat with metolazone), torsemide 100 mg BID, spironolactone 25 mg daily.  Pt states is taking carvedilol 25 mg BID but is not on home care medication list.  Left note on med list for home health to clarify that pt is taking carvedilol and vit b12.    Time with patient: 10 min   Preparation and documentation time: 5 min Total time: 15 min

## 2015-04-29 NOTE — Patient Instructions (Signed)
Labs today  Your physician recommends that you schedule a follow-up appointment in: 6 weeks with Dr.McLean  Do the following things EVERYDAY: 1) Weigh yourself in the morning before breakfast. Write it down and keep it in a log. 2) Take your medicines as prescribed 3) Eat low salt foods-Limit salt (sodium) to 2000 mg per day.  4) Stay as active as you can everyday 5) Limit all fluids for the day to less than 2 liters 6)   

## 2015-04-29 NOTE — Progress Notes (Signed)
Patient ID: Melissa Myers, female   DOB: 1945-12-06, 70 y.o.   MRN: [id number]    PCP: Dr. Asa Lente Endocrinologist: Dr Loanne Drilling Primary HF: Dr Aundra Dubin.   70 yo with history of HTN, DM, hyperlipidemia, OHS/OSA, and chronic dyspnea/diastolic CHF.  Patient had an echo in 04/2009 showing moderate LVH and preserved LV systolic function. However, there was a very large LV mid-cavity gradient with valsalva.  Patient developed quite significant exertional dyspnea to the point where she was short of breath walking around her house. She had a pulmonary evaluation with Dr. Elsworth Soho but no primary lung problems were identified. Patient had an ETT-myoview in 05/2009 which was negative for ischemia or infarction. Right heart cath in 05/2009 showed mildly elevated right heart filling pressures but normal PA pressure and normal PCWP. She was started her on a beta blocker (Coreg) to try to lower her LV mid-cavity gradient (that likely occurs with exertion) and to better control BP.  V/Q scan was negative for PE.  PFTs from 06/2011 showed a restrictive defect. Last echo in 11/2013 showed EF 65% with normal RV size and systolic function.      Admitted 12/05/13 with volume overload. Diuresed with IV lasix and transitioned to torsemide 80 mg twice a day + metolazone. Overall she diuresed 21 pounds. Discharge weight was 263 pounds.   She was admitted in 9/16 with Strep agalactiae sepsis likely from cellulitis.    Admitted 12/1 though 12/5 with marked volume overload and cellulitis. Diuresed with Lasix drip and placed on antibiotics. Overall diuresed 20 pounds.   She returns for follow up for heart failure. Weight down 4 lbs since last visit. Creatinine up recently to 1.66.  Says she has not been doing well since last visit.  Has had a lot of problems with her restless legs and neuropathy. Has trouble sleeping. Breathing is overall good, although has been getting more SOB from her pain she things. States her legs are "much  better" since restarting metolazone. Weight at home 252-257 this am. Denies orthopnea. + Bendopnea.  Has cut back to less than 2 L. Taking all medications. [facility] following for RN and PT.    Labs (10/13): K 4.1, creatinine 0.85 Labs (4/14): K 3.7, creatinine 1.2, BUN 43, BNP 23 Labs (5/14): K 4.1, creatinine 1.0 Labs (7/14): K 4.6 Creatinine 1.0 BNP 39.0  Labs (8/14): K 3.5, creatinine 0.91, BNP 71 Labs (11/14): K 3.7, creatinine 0.9 Labs (12/14): K 4.1, creatinine 0.8, BNP 52 Labs (3/15): K 4, creatinine 0.9 Labs (4/15): K 3.7, creatinine 0.9 Labs (5/15): K 3.8, creatinine 0.9, BNP 29 Labs (6/15): K 3.4, BUN 105, creatinine 0.9 =>1.7 Labs (6/17/5) K 4.2, BUN 41, Creatinine 0.80 Labs (08/19/13) K 3.2, BUN 71, creatinine 1.5 Labs 09/05/13 K 4.1 Creatinine 1.1 Labs 11/07/13 K 3.6 Creatinine 0.91 Labs 12/11/13 K 4.5 Creatine 1.05 Labs (12/15): K 3.8, creatinine 1.03 Labs (03/17/2014): K 4.1 Creatinine 0.99  Labs (4/16): K 3.7, creatinine 1.24 Labs (5/16): LDL 108, TGs 699 Labs (07/24/2014) K 3.5 Creatinine 1.17  Labs (07/31/2014) K 3.3 Creatinine 1.13 Labs (9/16): K 3.5, creatinine 0.93 Labs (01/26/2015) K 3.6 Creatinine 1.36  Labs 04/22/15 K 3.8, Creatinine 1.66  Allergies (verified):  1) ! Sulfa  2) Morphine   Past Medical History:  1. Diabetes mellitus, type II  2. Hyperlipidemia: She has been unable to tolerate statins (has been on Vytorin, lovastatin, and Crestor) due to what sounds like rhabdo: developed muscle weakness and was told she had "muscle damage" with  each of these statins. She has been told not to take statins anymore.  Has hypertriglyceridemia.  3. Hypertension 4. Obesity  5. Restless Leg Syndrome  6. OA  7. peripheral neuropathy  8. OHS/OSA: Uses supplemental oxygen, does not tolerate CPAP.   9. Chronic nausea/diarrhea: ? IBS  10. Diastolic CHF: Echo ([id number]) with normal LV size, moderate LVH, EF 65-70%, LV mid-cavity gradient reaching 63 mmHg with valsalva but minimal at  rest, grade I diastolic dysfunction, cannot estimate PA systolic pressure (no TR doppler signal), RV normal. RHC (4/11): Mean RA 11, RV 37/13, PA 33/15, mean PCWP 12, CI 3.3. Echo in 11/12 showed mild LVH, EF 65-70%, no LV mid-cavity gradient was mentioned.  Echo in 4/14 showed EF 65-70%, mild LVH, grade I diastolic dysfunction, PA systolic pressure 35 mmHg, mild LV mid-cavity gradient.  Echo (5/15) with EF 60-65%, mild TR, normal RV size and systolic function. Echo (10/15) with EF 65%, moderate LVH, normal RV.  11. ETT-myoview (4/11): 5'30", stopped due to fatigue, normal EF, no evidence for scar or ischemia. 12. V/Q scan 11/12: negative for PE.  Lower extremity venous doppler US (3/14): negative for DVT.  13. PFTs (5/13): FVC 50%, FEV1 62%, ratio 106%, DLCO 69%, TLC 69%.   Family History:  Family History of Alcoholism/Addiction (parent)  Family History Diabetes 1st degree relative (grandparent)  Family History High cholesterol (parent, grandparent)  Family History Hypertension (parent, grandparent)  Family History Lung cancer (grandparent)  Stomach cancer (grandmother)  Celiac Sprue daughter  Mother with MI at 65, father with MI at 56, uncle with MI at 66, brother with stents in his 36s, multiple aunts with MIs and CVAs   Social History:  Never Smoked  no alcohol  married, lives with spouse and her mother  retired Network engineer - now housewife  Alcohol Use - no  Illicit Drug Use - no  Review of Systems  All systems reviewed and negative except as per HPI.   ROS: All systems reviewed and negative except as per HPI.   Current Outpatient Prescriptions  Medication Sig Dispense Refill  . aspirin EC 81 MG tablet Take 1 tablet (81 mg total) by mouth daily.    . B Complex-C (B-COMPLEX WITH VITAMIN C) tablet Take 1 tablet by mouth daily.    . BD INSULIN SYRINGE ULTRAFINE 31G X 15/64" 1 ML MISC USE TO INJECT INSULIN 4 TIMES DAILY AS ADVISED. 100 each 4  . carvedilol (COREG) 25 MG tablet TAKE 1  TABLET BY MOUTH TWICE DAILY WITH FOOD 60 tablet 5  . Cholecalciferol (VITAMIN D3) 2000 UNITS capsule Take 2,000 Units by mouth 4 (four) times daily - after meals and at bedtime.     . Coenzyme Q10 200 MG capsule Take 200 mg by mouth daily.    . diclofenac sodium (VOLTAREN) 1 % GEL Apply 2 g topically daily as needed (knee pain). 10 g 3  . DULoxetine (CYMBALTA) 60 MG capsule Take 60 mg by mouth daily.    [name] ezetimibe (ZETIA) 10 MG tablet Take 1 tablet (10 mg total) by mouth daily. 90 tablet 3  . fenofibrate 160 MG tablet TAKE 1 TABLET (160 MG TOTAL) BY MOUTH DAILY. MUST ESTABLISH WITH NEW PCP FOR ADDITIONAL REFILLS. 90 tablet 3  . gabapentin (NEURONTIN) 300 MG capsule Take 2 capsules (600 mg total) by mouth 4 (four) times daily. 240 capsule 0  . glucose blood (ONETOUCH VERIO) test strip Use to check blood sugar 2 times per day. Dx code: E11.9 200  each 2  . hydrALAZINE (APRESOLINE) 25 MG tablet TAKE 1 TABLET (25 MG TOTAL) BY MOUTH 3 (THREE) TIMES DAILY. 90 tablet 2  . hydrOXYzine (ATARAX/VISTARIL) 10 MG tablet Take 10 mg by mouth 3 (three) times daily as needed for anxiety. Reported on 02/10/2015    . Insulin Pen Needle 32G X 4 MM MISC Use to inject insulin 1 time per day. 100 each 2  . insulin regular (NOVOLIN R,HUMULIN R) 100 units/mL injection Inject 50-70 Units into the skin 3 (three) times daily before meals. 70 units before breakfast, 70 units before lunch, 50 units before dinner    . ketoconazole (NIZORAL) 2 % cream Apply 1 application topically daily. Reported on 03/19/2015    . lactobacillus (FLORANEX/LACTINEX) PACK Take 1 packet (1 g total) by mouth 3 (three) times daily with meals. 30 packet 0  . Magnesium 500 MG TABS Take 500 mg by mouth daily.    . metolazone (ZAROXOLYN) 5 MG tablet TAKE 5mg  (1 tablet) every Wednesday and Saturday 15 tablet 3  . nystatin-triamcinolone ointment (MYCOLOG) Apply 1 application topically 2 (two) times daily. 60 g 2  . omega-3 acid ethyl esters (LOVAZA) 1 g  capsule Take 1 capsule (1 g total) by mouth 2 (two) times daily. 180 capsule 3  . oxycodone (OXY-IR) 5 MG capsule Take 2.5 mg by mouth every 6 (six) hours.     . potassium chloride (K-DUR) 10 MEQ tablet Take 40-60 mEq by mouth 2 (two) times daily. Take 40 meq twice daily.  Take 60 meq twice daily on wed/saturday with metolazone.    . potassium chloride (K-DUR,KLOR-CON) 10 MEQ tablet Take 4 tablets (40 mEq total) by mouth daily. Take an additional 2 tabs on Wednesay and Saturday with Metolazone. 130 tablet 3  . rOPINIRole (REQUIP) 2 MG tablet TAKE 1 TABLET (2 MG TOTAL) BY MOUTH 2 (TWO) TIMES DAILY. (MUST ESTABLISH WITH NEW PCP FOR ADDITIONAL REFILLS.) 180 tablet 3  . spironolactone (ALDACTONE) 25 MG tablet TAKE 1 TABLET BY MOUTH DAILY 30 tablet 2  . torsemide (DEMADEX) 100 MG tablet Take 1 tablet (100 mg total) by mouth 2 (two) times daily. 60 tablet 6  . triamcinolone cream (KENALOG) 0.1 % Apply 1 application topically 2 (two) times daily as needed (rash). Reported on 03/19/2015    . vitamin B-12 (CYANOCOBALAMIN) 500 MCG tablet Take 500 mcg by mouth daily. Reported on 03/19/2015     No current facility-administered medications for this encounter.   Filed Vitals:   04/29/15 1510  BP: 114/68  Pulse: 76  Weight: 258 lb 6.4 oz (117.209 kg)  SpO2: 95%   Wt Readings from Last 3 Encounters:  04/29/15 258 lb 6.4 oz (117.209 kg)  04/22/15 257 lb (116.574 kg)  04/18/15 254 lb 10.1 oz (115.5 kg)     General: NAD, obese. Husband present . Arrived in wheelchair.   Neck: Thick, JVP difficult to assess but appeasr elevated.  No thyromegaly or thyroid nodule.  Lungs: Clear . Chronic 2L oxygen CV: Nondisplaced PMI.  Heart regular S1/S2, no S3/S4, 2/6 SEM RUSB.    No carotid bruit.  Abdomen: obese, + distended, nontender, no hepatosplenomegaly Neurologic: Alert and oriented x 3.  Psych: Normal affect. Extremities: No clubbing/cyanosis, bilateral severe venous stasis changes. R and LLE trace-1 + edema  with chronic hyperpigmentation.     Assessment/Plan: 1. Chronic diastolic CHF:   NYHA III. Volume status elevated likely due to medication noncompliance and increased fluid intake - Continue torsemide 100 mg twice a  day and metolazone 5 mg twice a week.  - Reinforced daily weight , low salt food choices, and limiting fluid intake to < 2 liters per day. - Continue spiro 25 mg daily.  2. OHS:  Followed by Dr Elsworth Soho, has previously stated no role for CPAP.  - Should use home oxygen continuously  3. Obesity: Discussed limiting portions and increasing physical activity as able.  4. HTN: . Stable. Continue current regimen.   5. Hyperlipidemia:  - Started on Lovaxa last visit.  Continue Zetia 10 mg daily and Fenofibrate 160 mg daily. 6. Neuropathy - States she is out of gabapentin. Will give her 30 day supply to bridge to PCP appointment.     Follow up in 6-8 weeks.   BMET/BNP today.     Satira Mccallum Jazline Cumbee  PA-C  3:25 PM

## 2015-04-29 NOTE — Telephone Encounter (Signed)
LVM for daughter to call back.  

## 2015-04-30 ENCOUNTER — Telehealth: Payer: Self-pay | Admitting: Endocrinology

## 2015-04-30 DIAGNOSIS — E662 Morbid (severe) obesity with alveolar hypoventilation: Secondary | ICD-10-CM | POA: Diagnosis not present

## 2015-04-30 DIAGNOSIS — E785 Hyperlipidemia, unspecified: Secondary | ICD-10-CM | POA: Diagnosis not present

## 2015-04-30 DIAGNOSIS — I130 Hypertensive heart and chronic kidney disease with heart failure and stage 1 through stage 4 chronic kidney disease, or unspecified chronic kidney disease: Secondary | ICD-10-CM | POA: Diagnosis not present

## 2015-04-30 DIAGNOSIS — I5042 Chronic combined systolic (congestive) and diastolic (congestive) heart failure: Secondary | ICD-10-CM | POA: Diagnosis not present

## 2015-04-30 DIAGNOSIS — Z7982 Long term (current) use of aspirin: Secondary | ICD-10-CM | POA: Diagnosis not present

## 2015-04-30 DIAGNOSIS — E1142 Type 2 diabetes mellitus with diabetic polyneuropathy: Secondary | ICD-10-CM | POA: Diagnosis not present

## 2015-04-30 DIAGNOSIS — N186 End stage renal disease: Secondary | ICD-10-CM | POA: Diagnosis not present

## 2015-04-30 DIAGNOSIS — L03116 Cellulitis of left lower limb: Secondary | ICD-10-CM | POA: Diagnosis not present

## 2015-04-30 DIAGNOSIS — Z794 Long term (current) use of insulin: Secondary | ICD-10-CM | POA: Diagnosis not present

## 2015-04-30 DIAGNOSIS — M47816 Spondylosis without myelopathy or radiculopathy, lumbar region: Secondary | ICD-10-CM | POA: Diagnosis not present

## 2015-04-30 DIAGNOSIS — G2581 Restless legs syndrome: Secondary | ICD-10-CM | POA: Diagnosis not present

## 2015-04-30 DIAGNOSIS — E1122 Type 2 diabetes mellitus with diabetic chronic kidney disease: Secondary | ICD-10-CM | POA: Diagnosis not present

## 2015-04-30 DIAGNOSIS — L03115 Cellulitis of right lower limb: Secondary | ICD-10-CM | POA: Diagnosis not present

## 2015-04-30 DIAGNOSIS — M170 Bilateral primary osteoarthritis of knee: Secondary | ICD-10-CM | POA: Diagnosis not present

## 2015-04-30 DIAGNOSIS — G4733 Obstructive sleep apnea (adult) (pediatric): Secondary | ICD-10-CM | POA: Diagnosis not present

## 2015-04-30 DIAGNOSIS — Z9981 Dependence on supplemental oxygen: Secondary | ICD-10-CM | POA: Diagnosis not present

## 2015-04-30 MED ORDER — INSULIN REGULAR HUMAN 100 UNIT/ML IJ SOLN: INTRAMUSCULAR | Status: DC

## 2015-04-30 NOTE — Telephone Encounter (Signed)
Called [facility], labs have been faxed over

## 2015-04-30 NOTE — Telephone Encounter (Signed)
Rx refilled per pt's request.  

## 2015-04-30 NOTE — Telephone Encounter (Signed)
Pt does not use [facility] we need to call in the nph to [facility]

## 2015-05-01 ENCOUNTER — Other Ambulatory Visit (HOSPITAL_COMMUNITY): Payer: Self-pay | Admitting: Cardiology

## 2015-05-01 ENCOUNTER — Other Ambulatory Visit: Payer: Self-pay

## 2015-05-01 ENCOUNTER — Ambulatory Visit: Payer: PPO | Admitting: Occupational Therapy

## 2015-05-01 ENCOUNTER — Other Ambulatory Visit (HOSPITAL_COMMUNITY): Payer: Self-pay | Admitting: Internal Medicine

## 2015-05-01 ENCOUNTER — Telehealth (HOSPITAL_COMMUNITY): Payer: Self-pay | Admitting: Student

## 2015-05-01 MED ORDER — POTASSIUM CHLORIDE ER 10 MEQ PO TBCR: 40.0000 meq | EXTENDED_RELEASE_TABLET | ORAL | Status: DC

## 2015-05-01 MED ORDER — METOLAZONE 5 MG PO TABS: ORAL_TABLET | ORAL | Status: DC

## 2015-05-01 NOTE — Telephone Encounter (Signed)
Reviewed labs with Dr Aundra Dubin.  BUN up and BNP down.     Potassium already addressed. Will take 60 meq q am and 40 meq q pm with 60 meq BID on metolazone days.   Will decrease metolazone to once a week.  She took wednesday, so will not take tomorrows scheduled dose.  She will now take 5 mg metolazone every Saturday instead of twice weekly.   Legrand Como [address]" [place], PA-C 05/01/2015 1:00 PM

## 2015-05-01 NOTE — Patient Outreach (Signed)
[facility]) Care Management  05/01/2015  Melissa Myers [birth year] [id number]   Assessment-transition of care call. Member reports home health is still involved and that she is weighing herself. Member states her weight this morning was 249 pounds, which she is happy stating it has been a long time since she was below 250. Medications reviewed and member reports adjusted today(Metolazone and potassium). Member denies shortness of breath. RNCM discussed previous home visit scheduled during cardiology office visit and discussed new date for home visit.  Plan: home visit next week.   Thea Silversmith, RN, MSN, [place] Coordinator Cell: [phone]

## 2015-05-01 NOTE — Patient Outreach (Signed)
[facility]) Care Management  05/01/2015  MCKENA KRUER [birth year] [id number]   Late entry for 04/29/15-RNCM noted member had an appointment with cardiology scheduled for the same time as home visit. RNCM will call member to reschedule home visit.  Thea Silversmith, RN, MSN, [place] Coordinator Cell: [phone]

## 2015-05-04 ENCOUNTER — Telehealth: Payer: Self-pay | Admitting: Endocrinology

## 2015-05-04 ENCOUNTER — Other Ambulatory Visit (INDEPENDENT_AMBULATORY_CARE_PROVIDER_SITE_OTHER): Payer: PPO

## 2015-05-04 ENCOUNTER — Encounter: Payer: Self-pay | Admitting: Internal Medicine

## 2015-05-04 ENCOUNTER — Ambulatory Visit: Payer: PPO | Admitting: Occupational Therapy

## 2015-05-04 ENCOUNTER — Ambulatory Visit (INDEPENDENT_AMBULATORY_CARE_PROVIDER_SITE_OTHER): Payer: PPO | Admitting: Internal Medicine

## 2015-05-04 VITALS — BP 142/60 | HR 77 | Temp 98.0°F | Resp 18 | Ht <= 58 in | Wt 249.0 lb

## 2015-05-04 DIAGNOSIS — F322 Major depressive disorder, single episode, severe without psychotic features: Secondary | ICD-10-CM | POA: Diagnosis not present

## 2015-05-04 DIAGNOSIS — I5042 Chronic combined systolic (congestive) and diastolic (congestive) heart failure: Secondary | ICD-10-CM

## 2015-05-04 DIAGNOSIS — I5032 Chronic diastolic (congestive) heart failure: Secondary | ICD-10-CM | POA: Diagnosis not present

## 2015-05-04 DIAGNOSIS — Z794 Long term (current) use of insulin: Secondary | ICD-10-CM | POA: Diagnosis not present

## 2015-05-04 DIAGNOSIS — G2581 Restless legs syndrome: Secondary | ICD-10-CM | POA: Diagnosis not present

## 2015-05-04 DIAGNOSIS — N186 End stage renal disease: Secondary | ICD-10-CM | POA: Diagnosis not present

## 2015-05-04 DIAGNOSIS — Z9981 Dependence on supplemental oxygen: Secondary | ICD-10-CM | POA: Diagnosis not present

## 2015-05-04 DIAGNOSIS — E662 Morbid (severe) obesity with alveolar hypoventilation: Secondary | ICD-10-CM | POA: Diagnosis not present

## 2015-05-04 DIAGNOSIS — E1122 Type 2 diabetes mellitus with diabetic chronic kidney disease: Secondary | ICD-10-CM | POA: Diagnosis not present

## 2015-05-04 DIAGNOSIS — E785 Hyperlipidemia, unspecified: Secondary | ICD-10-CM | POA: Diagnosis not present

## 2015-05-04 DIAGNOSIS — L03115 Cellulitis of right lower limb: Secondary | ICD-10-CM | POA: Diagnosis not present

## 2015-05-04 DIAGNOSIS — G894 Chronic pain syndrome: Secondary | ICD-10-CM

## 2015-05-04 DIAGNOSIS — G4733 Obstructive sleep apnea (adult) (pediatric): Secondary | ICD-10-CM | POA: Diagnosis not present

## 2015-05-04 DIAGNOSIS — I130 Hypertensive heart and chronic kidney disease with heart failure and stage 1 through stage 4 chronic kidney disease, or unspecified chronic kidney disease: Secondary | ICD-10-CM | POA: Diagnosis not present

## 2015-05-04 DIAGNOSIS — E1142 Type 2 diabetes mellitus with diabetic polyneuropathy: Secondary | ICD-10-CM | POA: Diagnosis not present

## 2015-05-04 DIAGNOSIS — M170 Bilateral primary osteoarthritis of knee: Secondary | ICD-10-CM | POA: Diagnosis not present

## 2015-05-04 DIAGNOSIS — L03116 Cellulitis of left lower limb: Secondary | ICD-10-CM | POA: Diagnosis not present

## 2015-05-04 DIAGNOSIS — M47816 Spondylosis without myelopathy or radiculopathy, lumbar region: Secondary | ICD-10-CM | POA: Diagnosis not present

## 2015-05-04 DIAGNOSIS — Z7982 Long term (current) use of aspirin: Secondary | ICD-10-CM | POA: Diagnosis not present

## 2015-05-04 LAB — COMPREHENSIVE METABOLIC PANEL
ALT: 24 U/L (ref 0–35)
AST: 31 U/L (ref 0–37)
Albumin: 3.9 g/dL (ref 3.5–5.2)
Alkaline Phosphatase: 75 U/L (ref 39–117)
BILIRUBIN TOTAL: 0.4 mg/dL (ref 0.2–1.2)
BUN: 73 mg/dL — ABNORMAL HIGH (ref 6–23)
CALCIUM: 9.9 mg/dL (ref 8.4–10.5)
CHLORIDE: 83 meq/L — AB (ref 96–112)
CO2: 37 meq/L — AB (ref 19–32)
Creatinine, Ser: 1.57 mg/dL — ABNORMAL HIGH (ref 0.40–1.20)
GFR: 34.63 mL/min — AB (ref >60.00)
GLUCOSE: 179 mg/dL — AB (ref 70–99)
Potassium: 3.3 mEq/L — ABNORMAL LOW (ref 3.5–5.1)
Sodium: 134 mEq/L — ABNORMAL LOW (ref 135–145)
Total Protein: 7.6 g/dL (ref 6.0–8.3)

## 2015-05-04 LAB — BRAIN NATRIURETIC PEPTIDE: Pro B Natriuretic peptide (BNP): 25 pg/mL (ref 0.0–100.0)

## 2015-05-04 MED ORDER — HYDROCODONE-ACETAMINOPHEN 10-325 MG PO TABS: 1.0000 | ORAL_TABLET | Freq: Four times a day (QID) | ORAL | Status: DC | PRN

## 2015-05-04 MED ORDER — ROPINIROLE HCL 2 MG PO TABS: 2.0000 mg | ORAL_TABLET | Freq: Three times a day (TID) | ORAL | Status: DC | PRN

## 2015-05-04 MED ORDER — CITALOPRAM HYDROBROMIDE 20 MG PO TABS: 20.0000 mg | ORAL_TABLET | Freq: Every day | ORAL | Status: DC

## 2015-05-04 NOTE — Progress Notes (Signed)
Pre visit review using our clinic review tool, if applicable. No additional management support is needed unless otherwise documented below in the visit note. 

## 2015-05-04 NOTE — Telephone Encounter (Signed)
[facility] called stating that her insulin is wrong    Please send new Rx to pharmacy   Rx: NPH see previous office note  Pharmacy: [place]

## 2015-05-04 NOTE — Patient Instructions (Addendum)
We have given you the prescription for the rx hydrocodone that you can start taking after you run out of the oxycodone.   We have given you a controlled substance policy from our office that lets you know what our policy is.   We are checking the blood work today.   You can increase the requip to 3 times per day to see if this helps more.

## 2015-05-04 NOTE — Telephone Encounter (Signed)
The regular was sent in error. It should be NPH, 150 units qam

## 2015-05-04 NOTE — Telephone Encounter (Signed)
Please see below.

## 2015-05-05 ENCOUNTER — Telehealth (HOSPITAL_COMMUNITY): Payer: Self-pay | Admitting: Cardiology

## 2015-05-05 ENCOUNTER — Encounter: Payer: Self-pay | Admitting: Internal Medicine

## 2015-05-05 DIAGNOSIS — F322 Major depressive disorder, single episode, severe without psychotic features: Secondary | ICD-10-CM | POA: Insufficient documentation

## 2015-05-05 DIAGNOSIS — G894 Chronic pain syndrome: Secondary | ICD-10-CM | POA: Insufficient documentation

## 2015-05-05 MED ORDER — POTASSIUM CHLORIDE ER 10 MEQ PO TBCR: 60.0000 meq | EXTENDED_RELEASE_TABLET | Freq: Two times a day (BID) | ORAL | Status: DC

## 2015-05-05 MED ORDER — INSULIN NPH (HUMAN) (ISOPHANE) 100 UNIT/ML ~~LOC~~ SUSP: 150.0000 [IU] | Freq: Every day | SUBCUTANEOUS | Status: DC

## 2015-05-05 NOTE — Assessment & Plan Note (Signed)
Due to her neuropathy and lower back problems. She was taking oxycodone 5 mg QID with the pain clinic and they have cut to 2.5 mg QID now. Will switch to hydrocodone QID to see if we can do better with her pain. Gabapentin is maxed out and cymbalta as well. She did not do well with lyrica. Given her poor habitus it is unlikely that mobilization will be possible. Some of her depression is stemming from uncontrolled pain. She has signed pain contract today and will check UDS at next pickup.

## 2015-05-05 NOTE — Telephone Encounter (Signed)
Spoke to pt, told her correct insulin NPH 150 units every morning per Dr. Loanne Drilling was sent to pharmacy. Pt verbalized understanding.

## 2015-05-05 NOTE — Assessment & Plan Note (Signed)
Weight is stable, she is not mobile and does not exercise due to her pain. She is reliant on care from her family for some of her care.

## 2015-05-05 NOTE — Progress Notes (Signed)
   Subjective:    Patient ID: Melissa Myers, female    DOB: August 10, 1945, 70 y.o.   MRN: [id number]  HPI The patient is a 70 YO female coming in for her chronic pain. Her pain clinic no longer takes her insurance. She was getting oxycodone and gabapentin from them but the oxycodone is not really working. She has tried hydrocodone in the past and is not sure why they switched. She has tried lyrica and it did not do anything for her. She is in constant pain all the time with her legs and her hands. She is also taking requip for her legs which helps some.  Her next concern is her urinary incontinence. She is on many fluid pills and is not very mobile so cannot make it to the bathroom for how frequently she goes. She wears pads but they leak. She does not want to try anything for this right now due to high burden of other medications.  Next concern is her mood. She is taking the cymbalta but does not think that it is helping much with her mood. Unclear if it is helping with her pain. Her husband and son are with her and they mention that she is very mean and they help take care of her. She is willing to try something else for mood.   Review of Systems  Constitutional: Positive for activity change. Negative for fever, appetite change, fatigue and unexpected weight change.  HENT: Negative.   Eyes: Negative.   Respiratory: Negative for cough, chest tightness, shortness of breath and wheezing.   Cardiovascular: Negative for chest pain, palpitations and leg swelling.  Gastrointestinal: Negative for nausea, abdominal pain, diarrhea, constipation and abdominal distention.  Genitourinary: Negative for dysuria, urgency, frequency, hematuria and flank pain.       Incontinence  Musculoskeletal: Positive for myalgias, arthralgias and gait problem.  Skin: Positive for rash.  Neurological: Positive for weakness and numbness. Negative for dizziness and light-headedness.  Psychiatric/Behavioral: Negative.      Objective:   Physical Exam  Constitutional: She is oriented to person, place, and time. She appears well-developed and well-nourished.  HENT:  Head: Normocephalic and atraumatic.  Eyes: EOM are normal.  Neck: Normal range of motion.  Cardiovascular: Normal rate and regular rhythm.   Pulmonary/Chest: Effort normal and breath sounds normal. No respiratory distress. She has no wheezes. She has no rales.  Abdominal: Soft. Bowel sounds are normal. She exhibits no distension. There is no tenderness. There is no rebound.  Musculoskeletal: She exhibits no edema.  No to minimal edema in the legs.   Neurological: She is alert and oriented to person, place, and time. Coordination normal.  Skin: Skin is warm and dry.  Psychiatric:  Somewhat flat during the visit, tearful when talking about her pain   Filed Vitals:   05/04/15 1532  BP: 142/60  Pulse: 77  Temp: 98 F (36.7 C)  TempSrc: Oral  Resp: 18  Height: 4\' 10"  (1.473 m)  Weight: 249 lb (112.946 kg)  SpO2: 95%      Assessment & Plan:

## 2015-05-05 NOTE — Assessment & Plan Note (Signed)
Add celexa 20 mg daily to her cymbalta 60 mg daily. She should be in counseling but it is difficult for her to leave the house. Her husband and son were bullying her during the visit to take additional medication as they felt that her mood problem was severe.

## 2015-05-05 NOTE — Telephone Encounter (Signed)
PATIENT AWARE AND VOICED UNDERSTANDING  

## 2015-05-05 NOTE — Telephone Encounter (Signed)
-----   Message from Sedonia Friar, PA-C sent at 05/05/2015  8:35 AM EDT ----- Discussed with Dr Aundra Dubin.  No Metolazone this week.   Increase Potassium to 60 meq BID.    Legrand Como [address]" [place], PA-C 05/05/2015 8:35 AM

## 2015-05-05 NOTE — Assessment & Plan Note (Signed)
Not in flare today, per HF clinic checking BNP and CMP today so they can adjust potassium and diuretic. She appears dry on exam today. Taking metolazone and torsemide for fluid which may be excessive at this time which is worsening her QOL with constant incontinence.

## 2015-05-06 ENCOUNTER — Ambulatory Visit: Payer: PPO | Admitting: Occupational Therapy

## 2015-05-07 ENCOUNTER — Telehealth (HOSPITAL_COMMUNITY): Payer: Self-pay | Admitting: *Deleted

## 2015-05-07 ENCOUNTER — Other Ambulatory Visit (HOSPITAL_COMMUNITY): Payer: Self-pay | Admitting: Cardiology

## 2015-05-07 DIAGNOSIS — L03116 Cellulitis of left lower limb: Secondary | ICD-10-CM | POA: Diagnosis not present

## 2015-05-07 DIAGNOSIS — M47816 Spondylosis without myelopathy or radiculopathy, lumbar region: Secondary | ICD-10-CM | POA: Diagnosis not present

## 2015-05-07 DIAGNOSIS — N186 End stage renal disease: Secondary | ICD-10-CM | POA: Diagnosis not present

## 2015-05-07 DIAGNOSIS — E1122 Type 2 diabetes mellitus with diabetic chronic kidney disease: Secondary | ICD-10-CM | POA: Diagnosis not present

## 2015-05-07 DIAGNOSIS — I5042 Chronic combined systolic (congestive) and diastolic (congestive) heart failure: Secondary | ICD-10-CM | POA: Diagnosis not present

## 2015-05-07 DIAGNOSIS — E1142 Type 2 diabetes mellitus with diabetic polyneuropathy: Secondary | ICD-10-CM | POA: Diagnosis not present

## 2015-05-07 DIAGNOSIS — G2581 Restless legs syndrome: Secondary | ICD-10-CM | POA: Diagnosis not present

## 2015-05-07 DIAGNOSIS — E662 Morbid (severe) obesity with alveolar hypoventilation: Secondary | ICD-10-CM | POA: Diagnosis not present

## 2015-05-07 DIAGNOSIS — L03115 Cellulitis of right lower limb: Secondary | ICD-10-CM | POA: Diagnosis not present

## 2015-05-07 DIAGNOSIS — G4733 Obstructive sleep apnea (adult) (pediatric): Secondary | ICD-10-CM | POA: Diagnosis not present

## 2015-05-07 DIAGNOSIS — M170 Bilateral primary osteoarthritis of knee: Secondary | ICD-10-CM | POA: Diagnosis not present

## 2015-05-07 DIAGNOSIS — Z7982 Long term (current) use of aspirin: Secondary | ICD-10-CM | POA: Diagnosis not present

## 2015-05-07 DIAGNOSIS — Z794 Long term (current) use of insulin: Secondary | ICD-10-CM | POA: Diagnosis not present

## 2015-05-07 DIAGNOSIS — I130 Hypertensive heart and chronic kidney disease with heart failure and stage 1 through stage 4 chronic kidney disease, or unspecified chronic kidney disease: Secondary | ICD-10-CM | POA: Diagnosis not present

## 2015-05-07 DIAGNOSIS — I5022 Chronic systolic (congestive) heart failure: Secondary | ICD-10-CM

## 2015-05-07 DIAGNOSIS — E785 Hyperlipidemia, unspecified: Secondary | ICD-10-CM | POA: Diagnosis not present

## 2015-05-07 DIAGNOSIS — Z9981 Dependence on supplemental oxygen: Secondary | ICD-10-CM | POA: Diagnosis not present

## 2015-05-07 NOTE — Telephone Encounter (Signed)
Katie with paramedicine called and stated pts weight is 259lbs today on 3/13 she was 249lbs . Told katie I would speak with Jonni Sanger and call her back.   Called Katie and advised " take metolazone today with torsemide will need lab work tomorrow" per Dr.McLean   Pt added to lab schedule 3/17 at 11:15 am

## 2015-05-08 ENCOUNTER — Other Ambulatory Visit: Payer: Self-pay

## 2015-05-08 ENCOUNTER — Other Ambulatory Visit (HOSPITAL_COMMUNITY): Payer: Self-pay | Admitting: Cardiology

## 2015-05-08 ENCOUNTER — Ambulatory Visit (HOSPITAL_COMMUNITY)
Admission: RE | Admit: 2015-05-08 | Discharge: 2015-05-08 | Disposition: A | Discharge status: Home / Self Care | Payer: PPO | Source: Ambulatory Visit | Attending: Cardiology | Admitting: Cardiology

## 2015-05-08 ENCOUNTER — Ambulatory Visit: Payer: PPO | Admitting: Occupational Therapy

## 2015-05-08 ENCOUNTER — Ambulatory Visit: Payer: Self-pay

## 2015-05-08 ENCOUNTER — Telehealth (HOSPITAL_COMMUNITY): Payer: Self-pay | Admitting: Cardiology

## 2015-05-08 DIAGNOSIS — I5022 Chronic systolic (congestive) heart failure: Secondary | ICD-10-CM

## 2015-05-08 LAB — BASIC METABOLIC PANEL
Anion gap: 18 — ABNORMAL HIGH (ref 5–15)
BUN: 75 mg/dL — AB (ref 6–20)
CALCIUM: 10.3 mg/dL (ref 8.9–10.3)
CO2: 37 mmol/L — ABNORMAL HIGH (ref 22–32)
CREATININE: 1.82 mg/dL — AB (ref 0.44–1.00)
Chloride: 85 mmol/L — ABNORMAL LOW (ref 101–111)
GFR calc Af Amer: 32 mL/min — ABNORMAL LOW (ref >60)
GFR, EST NON AFRICAN AMERICAN: 27 mL/min — AB (ref >60)
Glucose, Bld: 82 mg/dL (ref 65–99)
Potassium: 2.8 mmol/L — ABNORMAL LOW (ref 3.5–5.1)
Sodium: 140 mmol/L (ref 135–145)

## 2015-05-08 MED ORDER — POTASSIUM CHLORIDE ER 10 MEQ PO TBCR: 80.0000 meq | EXTENDED_RELEASE_TABLET | Freq: Two times a day (BID) | ORAL | Status: DC

## 2015-05-08 NOTE — Telephone Encounter (Signed)
[phone] (H) PATIENT AWARE AND VOICED UNDERSTANDING

## 2015-05-08 NOTE — Telephone Encounter (Signed)
-----   Message from Larey Dresser, MD sent at 05/08/2015  1:03 PM EDT ----- KCl increase to 80 bid.   Keep diuretics the same but get BMET in 1 week.

## 2015-05-08 NOTE — Patient Outreach (Signed)
[facility]) Care Management  05/08/2015  KRISSY MISQUEZ [birth year] [id number]  Call regarding home visit-Member's mother answered the phone stating member was at an appointment. HIPPA compliant message left requesting a return call.  Assessment: 70 year old with recent admission 2/20-2/25. History of heart failure, diabetes. Per notes member continues close follow up with providers. She is also active with home health and Para-medicine. Continues to have medication adjustments regarding heart failure management. Per notes other issues: chronic pain; urinary incontinence; mood. Member has an office appointment this morning/lab draws.  Plan: reschedule home visit. Discuss Care Connection program.  Thea Silversmith, RN, MSN, [place] Coordinator Cell: [phone]

## 2015-05-10 NOTE — Progress Notes (Signed)
Subjective:    Patient ID: Melissa Myers, female    DOB: Apr 04, 1945, 70 y.o.   MRN: [id number]  HPI Pt returns for f/u of diabetes mellitus: DM type: Insulin-requiring type 2.   Dx'ed: [id number] Complications: sensory neuropathy of the lower extremities, renal insufficiency, and leg ulcers. Therapy: insulin since 2011 GDM: never DKA: never Severe hypoglycemia: several times; most recently in early 2017.  Pancreatitis: never Other: she takes qd insulin, after poor results with multiple daily injections; the pattern of cbg's indicated she did not need basal insulin; she is too ill to undergo weight loss surgery.  she takes human insulin, due to cost Interval history: she does not check cbg's.   pt states she feels well in general. Past Medical History  Diagnosis Date  . Type II or unspecified type diabetes mellitus without mention of complication, not stated as uncontrolled     insulin dep  . Hyperlipidemia     hx rhabdo on statins  . Hypertension   . Diastolic CHF ([place])   . Chronic diarrhea     a/w nausea - felt related to IBS  . RLS (restless legs syndrome)   . Osteoarthritis   . Stasis dermatitis   . GERD (gastroesophageal reflux disease)   . On home oxygen therapy     uses oxygen 2 liters min per [place] at night and prn during day  . Anemia   . Neuropathy (H[facility])     feet, toes and fingers  . Disc degeneration, lumbar   . OSA (obstructive sleep apnea)     05/2009 sleep study - refuses CPAP  . Deaf     left side only  . Shortness of breath     chronic  . Cellulitis     LOWER EXTREMITIES    Past Surgical History  Procedure Laterality Date  . Tubal ligation  1980  . Cholecystectomy  1997  . Uterine polyp removal  2008  . Umbilical hernia repair  1995  . Tonsillectomy  1970  . Colonoscopy N/A 12/03/2012    Procedure: COLONOSCOPY;  Surgeon: Lafayette Dragon, MD;  Location: [facility];  Service: Endoscopy;  Laterality: N/A;    Social History   Social History  .  Marital Status: Married    Spouse Name: N/A  . Number of Children: N/A  . Years of Education: N/A   Occupational History  . Not on file.   Social History Main Topics  . Smoking status: Never Smoker   . Smokeless tobacco: Never Used  . Alcohol Use: No  . Drug Use: No  . Sexual Activity: No   Other Topics Concern  . Not on file   Social History Narrative   Lives with spouse and mother. Retired Network engineer, now housewife    Current Outpatient Prescriptions on File Prior to Visit  Medication Sig Dispense Refill  . aspirin EC 81 MG tablet Take 1 tablet (81 mg total) by mouth daily.    . B Complex-C (B-COMPLEX WITH VITAMIN C) tablet Take 1 tablet by mouth daily.    . BD INSULIN SYRINGE ULTRAFINE 31G X 15/64" 1 ML MISC USE TO INJECT INSULIN 4 TIMES DAILY AS ADVISED. 100 each 4  . carvedilol (COREG) 25 MG tablet TAKE 1 TABLET BY MOUTH TWICE DAILY WITH FOOD 60 tablet 5  . carvedilol (COREG) 25 MG tablet TAKE 1 TABLET BY MOUTH TWICE DAILY WITH FOOD 60 tablet 5  . Cholecalciferol (VITAMIN D3) 2000 UNITS capsule Take 2,000 Units by mouth  4 (four) times daily - after meals and at bedtime.     . citalopram (CELEXA) 20 MG tablet Take 1 tablet (20 mg total) by mouth daily. 30 tablet 3  . Coenzyme Q10 200 MG capsule Take 200 mg by mouth daily.    . diclofenac sodium (VOLTAREN) 1 % GEL Apply 2 g topically daily as needed (knee pain). 10 g 3  . DULoxetine (CYMBALTA) 60 MG capsule Take 60 mg by mouth daily.    [name] ezetimibe (ZETIA) 10 MG tablet Take 1 tablet (10 mg total) by mouth daily. 90 tablet 3  . fenofibrate 160 MG tablet TAKE 1 TABLET (160 MG TOTAL) BY MOUTH DAILY. MUST ESTABLISH WITH NEW PCP FOR ADDITIONAL REFILLS. 90 tablet 3  . gabapentin (NEURONTIN) 300 MG capsule Take 2 capsules (600 mg total) by mouth 4 (four) times daily. 240 capsule 0  . hydrALAZINE (APRESOLINE) 25 MG tablet TAKE 1 TABLET (25 MG TOTAL) BY MOUTH 3 (THREE) TIMES DAILY. 90 tablet 2  . hydrALAZINE (APRESOLINE) 25 MG tablet  TAKE 1 TABLET (25 MG TOTAL) BY MOUTH 3 (THREE) TIMES DAILY. 90 tablet 2  . HYDROcodone-acetaminophen (NORCO) 10-325 MG tablet Take 1 tablet by mouth every 6 (six) hours as needed. 120 tablet 0  . hydrOXYzine (ATARAX/VISTARIL) 10 MG tablet Take 10 mg by mouth 3 (three) times daily as needed for anxiety. Reported on 02/10/2015    . Insulin Pen Needle 32G X 4 MM MISC Use to inject insulin 1 time per day. 100 each 2  . insulin regular (NOVOLIN R,HUMULIN R) 100 units/mL injection 70 units before breakfast, 70 units before lunch, 50 units before dinner 60 mL 3  . ketoconazole (NIZORAL) 2 % cream Apply 1 application topically daily. Reported on 03/19/2015    . lactobacillus (FLORANEX/LACTINEX) PACK Take 1 packet (1 g total) by mouth 3 (three) times daily with meals. 30 packet 0  . Magnesium 500 MG TABS Take 500 mg by mouth daily.    . metolazone (ZAROXOLYN) 5 MG tablet TAKE 5mg  (1 tablet) Saturday.  Can take an additional as needed for wt gain 3lbs overnight or 5 lbs within one week. 15 tablet 3  . nystatin-triamcinolone ointment (MYCOLOG) Apply 1 application topically 2 (two) times daily. 60 g 2  . omega-3 acid ethyl esters (LOVAZA) 1 g capsule Take 1 capsule (1 g total) by mouth 2 (two) times daily. 180 capsule 3  . potassium chloride (K-DUR) 10 MEQ tablet Take 8 tablets (80 mEq total) by mouth 2 (two) times daily. 320 tablet 6  . rOPINIRole (REQUIP) 2 MG tablet Take 1 tablet (2 mg total) by mouth 3 (three) times daily as needed. 270 tablet 3  . spironolactone (ALDACTONE) 25 MG tablet TAKE 1 TABLET BY MOUTH DAILY 30 tablet 2  . torsemide (DEMADEX) 100 MG tablet TAKE 1 TABLET (100 MG TOTAL) BY MOUTH 2 (TWO) TIMES DAILY. 60 tablet 5  . triamcinolone cream (KENALOG) 0.1 % Apply 1 application topically 2 (two) times daily as needed (rash). Reported on 05/04/2015    . vitamin B-12 (CYANOCOBALAMIN) 500 MCG tablet Take 500 mcg by mouth daily. Reported on 03/19/2015     No current facility-administered medications  on file prior to visit.    Allergies  Allergen Reactions  . Cymbalta [Duloxetine Hcl] Other (See Comments)    Restless leg syndrome  . Statins Other (See Comments)    Statin drugs cause muscle pain / "muscle damage"--was told by MD not to take  . Sulfa Antibiotics  Diarrhea  . Levemir [Insulin Detemir] Itching  . Morphine Other (See Comments)    GI upset and headaches  . Zinc Swelling and Rash    Family History  Problem Relation Age of Onset  . Heart disease Mother   . Heart disease Father   . Heart disease      family history  . Stomach cancer Paternal Grandmother   . Lung cancer Paternal Grandfather   . Colon cancer Neg Hx   . CVA      several aunts  . Heart attack Mother 47  . Heart attack Father 56  . Heart attack      several aunts and an uncle    BP 136/60 mmHg  Pulse 70  Temp(Src) 98 F (36.7 C) (Oral)  Wt 250 lb (113.399 kg)  SpO2 93%   Review of Systems She denies hypoglycemia    Objective:   Physical Exam VITAL SIGNS:  See vs page GENERAL: no distress.  Morbid obesity: in wheelchair.   Pulses: dorsalis pedis intact bilat.   MSK: no deformity of the feet  CV: 1+ bilat leg edema  Skin: no ulcer on the feet, but the skin is dry. normal temp on the feet. The legs have bilat spotty hyperpigmentation, and both feet have erythema when held dependent.   Neuro: sensation is intact to touch on the feet, but decreased from normal.   Ext: There is bilateral onychomycosis of the toenails, and heavy calluses.    Lab Results  Component Value Date   HGBA1C 9.6* 04/14/2015      Assessment & Plan:  DM: poor control Noncompliance with cbg recording, worse  Patient is advised the following: Patient Instructions  check your blood sugar twice a day.  vary the time of day when you check, between before the 3 meals, and at bedtime.  also check if you have symptoms of your blood sugar being too high or too low.  please keep a record of the readings and bring it  to your next appointment here.  You can write it on any piece of paper.  please call us sooner if your blood sugar goes below 70, or if you have a lot of readings over 200.  Please come back for a follow-up appointment in 1 month.  i have sent a prescription to your pharmacy, to refill the strips.   Please call us next week, to tell us how the blood sugar is doing.

## 2015-05-11 ENCOUNTER — Other Ambulatory Visit (HOSPITAL_COMMUNITY): Payer: PPO

## 2015-05-11 ENCOUNTER — Ambulatory Visit: Payer: PPO | Admitting: Occupational Therapy

## 2015-05-11 ENCOUNTER — Encounter: Payer: Self-pay | Admitting: Endocrinology

## 2015-05-11 ENCOUNTER — Ambulatory Visit (INDEPENDENT_AMBULATORY_CARE_PROVIDER_SITE_OTHER): Payer: PPO | Admitting: Endocrinology

## 2015-05-11 VITALS — BP 136/60 | HR 70 | Temp 98.0°F | Wt 250.0 lb

## 2015-05-11 DIAGNOSIS — N184 Chronic kidney disease, stage 4 (severe): Secondary | ICD-10-CM | POA: Diagnosis not present

## 2015-05-11 DIAGNOSIS — Z794 Long term (current) use of insulin: Secondary | ICD-10-CM

## 2015-05-11 DIAGNOSIS — E1122 Type 2 diabetes mellitus with diabetic chronic kidney disease: Secondary | ICD-10-CM | POA: Diagnosis not present

## 2015-05-11 MED ORDER — GLUCOSE BLOOD VI STRP: ORAL_STRIP | Status: DC

## 2015-05-11 MED ORDER — INSULIN NPH (HUMAN) (ISOPHANE) 100 UNIT/ML ~~LOC~~ SUSP: 150.0000 [IU] | Freq: Every day | SUBCUTANEOUS | Status: DC

## 2015-05-11 NOTE — Patient Instructions (Addendum)
check your blood sugar twice a day.  vary the time of day when you check, between before the 3 meals, and at bedtime.  also check if you have symptoms of your blood sugar being too high or too low.  please keep a record of the readings and bring it to your next appointment here.  You can write it on any piece of paper.  please call us sooner if your blood sugar goes below 70, or if you have a lot of readings over 200.  Please come back for a follow-up appointment in 1 month.  i have sent a prescription to your pharmacy, to refill the strips.   Please call us next week, to tell us how the blood sugar is doing.

## 2015-05-12 ENCOUNTER — Other Ambulatory Visit (HOSPITAL_COMMUNITY): Payer: Self-pay

## 2015-05-12 DIAGNOSIS — E1122 Type 2 diabetes mellitus with diabetic chronic kidney disease: Secondary | ICD-10-CM | POA: Diagnosis not present

## 2015-05-12 DIAGNOSIS — E1142 Type 2 diabetes mellitus with diabetic polyneuropathy: Secondary | ICD-10-CM | POA: Diagnosis not present

## 2015-05-12 DIAGNOSIS — L03115 Cellulitis of right lower limb: Secondary | ICD-10-CM | POA: Diagnosis not present

## 2015-05-12 DIAGNOSIS — Z7982 Long term (current) use of aspirin: Secondary | ICD-10-CM | POA: Diagnosis not present

## 2015-05-12 DIAGNOSIS — M170 Bilateral primary osteoarthritis of knee: Secondary | ICD-10-CM | POA: Diagnosis not present

## 2015-05-12 DIAGNOSIS — I130 Hypertensive heart and chronic kidney disease with heart failure and stage 1 through stage 4 chronic kidney disease, or unspecified chronic kidney disease: Secondary | ICD-10-CM | POA: Diagnosis not present

## 2015-05-12 DIAGNOSIS — G4733 Obstructive sleep apnea (adult) (pediatric): Secondary | ICD-10-CM | POA: Diagnosis not present

## 2015-05-12 DIAGNOSIS — L03116 Cellulitis of left lower limb: Secondary | ICD-10-CM | POA: Diagnosis not present

## 2015-05-12 DIAGNOSIS — I5042 Chronic combined systolic (congestive) and diastolic (congestive) heart failure: Secondary | ICD-10-CM | POA: Diagnosis not present

## 2015-05-12 DIAGNOSIS — M47816 Spondylosis without myelopathy or radiculopathy, lumbar region: Secondary | ICD-10-CM | POA: Diagnosis not present

## 2015-05-12 DIAGNOSIS — E662 Morbid (severe) obesity with alveolar hypoventilation: Secondary | ICD-10-CM | POA: Diagnosis not present

## 2015-05-12 DIAGNOSIS — E785 Hyperlipidemia, unspecified: Secondary | ICD-10-CM | POA: Diagnosis not present

## 2015-05-12 DIAGNOSIS — N186 End stage renal disease: Secondary | ICD-10-CM | POA: Diagnosis not present

## 2015-05-12 DIAGNOSIS — Z794 Long term (current) use of insulin: Secondary | ICD-10-CM | POA: Diagnosis not present

## 2015-05-12 DIAGNOSIS — G2581 Restless legs syndrome: Secondary | ICD-10-CM | POA: Diagnosis not present

## 2015-05-12 DIAGNOSIS — Z9981 Dependence on supplemental oxygen: Secondary | ICD-10-CM | POA: Diagnosis not present

## 2015-05-12 NOTE — Telephone Encounter (Signed)
Spoke with [facility] RN Larena Glassman needing clarification on metolazone dosage as she fills weekly pill box. States she has been taking metolazone every Wednesday and Saturday 5 mg tablet. Clarified that we chagned her metolazone to every Saturday and one additional tablet within that week if needed for weight gain. RN states patient has been doing very well, weight has been stable at 250 lbs, legs doing better, and compliant with salt and fluid intake. Patient due to come in for lab work this Friday to recheck Bmet.  Renee Pain

## 2015-05-13 ENCOUNTER — Ambulatory Visit: Payer: PPO | Admitting: Occupational Therapy

## 2015-05-14 ENCOUNTER — Telehealth (HOSPITAL_COMMUNITY): Payer: Self-pay | Admitting: *Deleted

## 2015-05-14 DIAGNOSIS — I130 Hypertensive heart and chronic kidney disease with heart failure and stage 1 through stage 4 chronic kidney disease, or unspecified chronic kidney disease: Secondary | ICD-10-CM | POA: Diagnosis not present

## 2015-05-14 DIAGNOSIS — Z9981 Dependence on supplemental oxygen: Secondary | ICD-10-CM | POA: Diagnosis not present

## 2015-05-14 DIAGNOSIS — L03116 Cellulitis of left lower limb: Secondary | ICD-10-CM | POA: Diagnosis not present

## 2015-05-14 DIAGNOSIS — E1142 Type 2 diabetes mellitus with diabetic polyneuropathy: Secondary | ICD-10-CM | POA: Diagnosis not present

## 2015-05-14 DIAGNOSIS — I5042 Chronic combined systolic (congestive) and diastolic (congestive) heart failure: Secondary | ICD-10-CM | POA: Diagnosis not present

## 2015-05-14 DIAGNOSIS — M170 Bilateral primary osteoarthritis of knee: Secondary | ICD-10-CM | POA: Diagnosis not present

## 2015-05-14 DIAGNOSIS — L03115 Cellulitis of right lower limb: Secondary | ICD-10-CM | POA: Diagnosis not present

## 2015-05-14 DIAGNOSIS — N186 End stage renal disease: Secondary | ICD-10-CM | POA: Diagnosis not present

## 2015-05-14 DIAGNOSIS — G4733 Obstructive sleep apnea (adult) (pediatric): Secondary | ICD-10-CM | POA: Diagnosis not present

## 2015-05-14 DIAGNOSIS — E785 Hyperlipidemia, unspecified: Secondary | ICD-10-CM | POA: Diagnosis not present

## 2015-05-14 DIAGNOSIS — E1122 Type 2 diabetes mellitus with diabetic chronic kidney disease: Secondary | ICD-10-CM | POA: Diagnosis not present

## 2015-05-14 DIAGNOSIS — Z794 Long term (current) use of insulin: Secondary | ICD-10-CM | POA: Diagnosis not present

## 2015-05-14 DIAGNOSIS — E662 Morbid (severe) obesity with alveolar hypoventilation: Secondary | ICD-10-CM | POA: Diagnosis not present

## 2015-05-14 DIAGNOSIS — Z7982 Long term (current) use of aspirin: Secondary | ICD-10-CM | POA: Diagnosis not present

## 2015-05-14 DIAGNOSIS — M47816 Spondylosis without myelopathy or radiculopathy, lumbar region: Secondary | ICD-10-CM | POA: Diagnosis not present

## 2015-05-14 DIAGNOSIS — G2581 Restless legs syndrome: Secondary | ICD-10-CM | POA: Diagnosis not present

## 2015-05-14 NOTE — Telephone Encounter (Signed)
Katie called to report pt's wt is up 3 lb from yesterday, she was 78 today, 14 yesterday, last week she was up to 259 and took a metolazone then, wt had been decreasing until today, however pt admits she had fried fish for dinner last night.  In reviewing meds, pt has been taking all her meds and has not missed any doses, however it appears she did not increase her KCL as instructed on Fri 3/17, she states she gets confused on meds so HHRN fixes the box and she just takes them.  Advised pt in the future if we call about med changes to ask [place] to call [facility] or Katie to help ensure it's done, she is agreeable and Joellen Jersey will make sure med is increased today.  Pt is not SOB and does not have any more swelling than usual.  SHe is sch for labs tomorrow, will cx appt since she did not increase KCL, order sent to [facility] for repeat labs next week.  Advised pt to watch salt and fluid intake today, if wt not down some tomorrow will need to take Metolazone.  Katie relayed all this info to pt and she is agreeable and verbalizes understanding.

## 2015-05-15 ENCOUNTER — Telehealth (HOSPITAL_COMMUNITY): Payer: Self-pay | Admitting: *Deleted

## 2015-05-15 ENCOUNTER — Telehealth: Payer: Self-pay

## 2015-05-15 ENCOUNTER — Encounter: Payer: Self-pay | Admitting: Nurse Practitioner

## 2015-05-15 ENCOUNTER — Other Ambulatory Visit: Payer: Self-pay

## 2015-05-15 ENCOUNTER — Other Ambulatory Visit (HOSPITAL_COMMUNITY): Payer: PPO

## 2015-05-15 ENCOUNTER — Other Ambulatory Visit: Payer: PPO

## 2015-05-15 ENCOUNTER — Ambulatory Visit (INDEPENDENT_AMBULATORY_CARE_PROVIDER_SITE_OTHER): Payer: PPO | Admitting: Nurse Practitioner

## 2015-05-15 ENCOUNTER — Ambulatory Visit: Payer: PPO | Admitting: Occupational Therapy

## 2015-05-15 VITALS — BP 120/58 | HR 65 | Temp 98.1°F | Wt 248.2 lb

## 2015-05-15 DIAGNOSIS — I5022 Chronic systolic (congestive) heart failure: Secondary | ICD-10-CM

## 2015-05-15 DIAGNOSIS — R300 Dysuria: Secondary | ICD-10-CM | POA: Diagnosis not present

## 2015-05-15 LAB — POCT URINALYSIS DIPSTICK
Bilirubin, UA: NEGATIVE
Blood, UA: NEGATIVE
Glucose, UA: POSITIVE
Ketones, UA: NEGATIVE
Nitrite, UA: NEGATIVE
Protein, UA: NEGATIVE
Spec Grav, UA: 1.02
Urobilinogen, UA: 0.2
pH, UA: 7

## 2015-05-15 MED ORDER — POTASSIUM CHLORIDE ER 20 MEQ PO TBCR: 40.0000 meq | EXTENDED_RELEASE_TABLET | Freq: Two times a day (BID) | ORAL | Status: DC

## 2015-05-15 MED ORDER — AMOXICILLIN-POT CLAVULANATE 875-125 MG PO TABS: 1.0000 | ORAL_TABLET | Freq: Two times a day (BID) | ORAL | Status: DC

## 2015-05-15 NOTE — Patient Instructions (Signed)

## 2015-05-15 NOTE — Progress Notes (Signed)
Patient ID: Melissa Myers, female    DOB: [birth year]  Age: 70 y.o. MRN: [id number]  CC: Urinary Tract Infection   HPI Melissa Myers presents for CC of UTI symptoms x 1 week.   1) White blood cells 3+ and glu 1+ in urine Fasting was 140 this morning  Drinking water consistently throughout the day Having some burning and drinking cranberry cocktail juice at home   History Melissa Myers has a past medical history of Type II or unspecified type diabetes mellitus without mention of complication, not stated as uncontrolled; Hyperlipidemia; Hypertension; Diastolic CHF ([place]); Chronic diarrhea; RLS (restless legs syndrome); Osteoarthritis; Stasis dermatitis; GERD (gastroesophageal reflux disease); On home oxygen therapy; Anemia; Neuropathy ([place]); Disc degeneration, lumbar; OSA (obstructive sleep apnea); Deaf; Shortness of breath; and Cellulitis.   She has past surgical history that includes Tubal ligation (1980); Cholecystectomy (1997); uterine polyp removal (2008); Umbilical hernia repair (1995); Tonsillectomy (1970); and Colonoscopy (N/A, 12/03/2012).   Her family history includes Heart attack (age of onset: 62) in her mother; Heart attack (age of onset: 71) in her father; Heart disease in her father and mother; Lung cancer in her paternal grandfather; Stomach cancer in her paternal grandmother. There is no history of Colon cancer.She reports that she has never smoked. She has never used smokeless tobacco. She reports that she does not drink alcohol or use illicit drugs.  Outpatient Prescriptions Prior to Visit  Medication Sig Dispense Refill  . aspirin EC 81 MG tablet Take 1 tablet (81 mg total) by mouth daily.    . B Complex-C (B-COMPLEX WITH VITAMIN C) tablet Take 1 tablet by mouth daily.    . BD INSULIN SYRINGE ULTRAFINE 31G X 15/64" 1 ML MISC USE TO INJECT INSULIN 4 TIMES DAILY AS ADVISED. 100 each 4  . carvedilol (COREG) 25 MG tablet TAKE 1 TABLET BY MOUTH TWICE DAILY WITH FOOD 60 tablet  5  . Cholecalciferol (VITAMIN D3) 2000 UNITS capsule Take 2,000 Units by mouth 4 (four) times daily - after meals and at bedtime.     . citalopram (CELEXA) 20 MG tablet Take 1 tablet (20 mg total) by mouth daily. 30 tablet 3  . Coenzyme Q10 200 MG capsule Take 200 mg by mouth daily.    . diclofenac sodium (VOLTAREN) 1 % GEL Apply 2 g topically daily as needed (knee pain). 10 g 3  . DULoxetine (CYMBALTA) 60 MG capsule Take 60 mg by mouth daily.    [name] ezetimibe (ZETIA) 10 MG tablet Take 1 tablet (10 mg total) by mouth daily. 90 tablet 3  . fenofibrate 160 MG tablet TAKE 1 TABLET (160 MG TOTAL) BY MOUTH DAILY. MUST ESTABLISH WITH NEW PCP FOR ADDITIONAL REFILLS. 90 tablet 3  . gabapentin (NEURONTIN) 300 MG capsule Take 2 capsules (600 mg total) by mouth 4 (four) times daily. 240 capsule 0  . glucose blood (ONETOUCH VERIO) test strip Use to check blood sugar 2 times per day. Dx code: E11.9 200 each 2  . hydrALAZINE (APRESOLINE) 25 MG tablet TAKE 1 TABLET (25 MG TOTAL) BY MOUTH 3 (THREE) TIMES DAILY. 90 tablet 2  . HYDROcodone-acetaminophen (NORCO) 10-325 MG tablet Take 1 tablet by mouth every 6 (six) hours as needed. 120 tablet 0  . hydrOXYzine (ATARAX/VISTARIL) 10 MG tablet Take 10 mg by mouth 3 (three) times daily as needed for anxiety. Reported on 02/10/2015    . insulin NPH Human (HUMULIN N) 100 UNIT/ML injection Inject 1.5 mLs (150 Units total) into the skin daily before  breakfast. 50 mL 11  . Insulin Pen Needle 32G X 4 MM MISC Use to inject insulin 1 time per day. 100 each 2  . insulin regular (NOVOLIN R,HUMULIN R) 100 units/mL injection 70 units before breakfast, 70 units before lunch, 50 units before dinner 60 mL 3  . ketoconazole (NIZORAL) 2 % cream Apply 1 application topically daily. Reported on 03/19/2015    . lactobacillus (FLORANEX/LACTINEX) PACK Take 1 packet (1 g total) by mouth 3 (three) times daily with meals. 30 packet 0  . Magnesium 500 MG TABS Take 500 mg by mouth daily.    .  metolazone (ZAROXOLYN) 5 MG tablet TAKE 5mg  (1 tablet) Saturday.  Can take an additional as needed for wt gain 3lbs overnight or 5 lbs within one week. 15 tablet 3  . nystatin-triamcinolone ointment (MYCOLOG) Apply 1 application topically 2 (two) times daily. 60 g 2  . omega-3 acid ethyl esters (LOVAZA) 1 g capsule Take 1 capsule (1 g total) by mouth 2 (two) times daily. 180 capsule 3  . potassium chloride 20 MEQ TBCR Take 40 mEq by mouth 2 (two) times daily. With an additional 40 on Sat 145 tablet 6  . rOPINIRole (REQUIP) 2 MG tablet Take 1 tablet (2 mg total) by mouth 3 (three) times daily as needed. 270 tablet 3  . spironolactone (ALDACTONE) 25 MG tablet TAKE 1 TABLET BY MOUTH DAILY 30 tablet 2  . torsemide (DEMADEX) 100 MG tablet TAKE 1 TABLET (100 MG TOTAL) BY MOUTH 2 (TWO) TIMES DAILY. 60 tablet 5  . triamcinolone cream (KENALOG) 0.1 % Apply 1 application topically 2 (two) times daily as needed (rash). Reported on 05/04/2015    . vitamin B-12 (CYANOCOBALAMIN) 500 MCG tablet Take 500 mcg by mouth daily. Reported on 03/19/2015    . carvedilol (COREG) 25 MG tablet TAKE 1 TABLET BY MOUTH TWICE DAILY WITH FOOD 60 tablet 5  . hydrALAZINE (APRESOLINE) 25 MG tablet TAKE 1 TABLET (25 MG TOTAL) BY MOUTH 3 (THREE) TIMES DAILY. 90 tablet 2   No facility-administered medications prior to visit.    ROS Review of Systems  Constitutional: Negative for fever, chills, diaphoresis and fatigue.  Genitourinary: Positive for dysuria, urgency and frequency. Negative for hematuria, flank pain, decreased urine volume, difficulty urinating and pelvic pain.  Skin: Negative for rash.    Objective:  BP 120/58 mmHg  Pulse 65  Temp(Src) 98.1 F (36.7 C) (Oral)  Wt 248 lb 4 oz (112.605 kg)  SpO2 90%  Physical Exam  Constitutional: She is oriented to person, place, and time. She appears well-developed and well-nourished. No distress.  HENT:  Head: Normocephalic and atraumatic.  Right Ear: External ear normal.   Left Ear: External ear normal.  Eyes: Right eye exhibits no discharge. Left eye exhibits no discharge. No scleral icterus.  Abdominal: There is no CVA tenderness.  Neurological: She is alert and oriented to person, place, and time.  Skin: Skin is warm and dry. No rash noted. She is not diaphoretic.  Psychiatric: She has a normal mood and affect. Her behavior is normal. Judgment and thought content normal.   Assessment & Plan:   Melissa Myers was seen today for urinary tract infection.  Diagnoses and all orders for this visit:  Dysuria -     POCT urinalysis dipstick  Other orders -     amoxicillin-clavulanate (AUGMENTIN) 875-125 MG tablet; Take 1 tablet by mouth 2 (two) times daily.   I am having Melissa Myers start on amoxicillin-clavulanate. I am  also having her maintain her aspirin EC, ezetimibe, Vitamin D3, B-complex with vitamin C, hydrOXYzine, Magnesium, hydrALAZINE, diclofenac sodium, lactobacillus, carvedilol, vitamin B-12, triamcinolone cream, BD INSULIN SYRINGE ULTRAFINE, ketoconazole, fenofibrate, Insulin Pen Needle, Coenzyme Q10, nystatin-triamcinolone ointment, spironolactone, DULoxetine, gabapentin, omega-3 acid ethyl esters, insulin regular, metolazone, torsemide, HYDROcodone-acetaminophen, rOPINIRole, citalopram, insulin NPH Human, glucose blood, and Potassium Chloride ER.  Meds ordered this encounter  Medications  . amoxicillin-clavulanate (AUGMENTIN) 875-125 MG tablet    Sig: Take 1 tablet by mouth 2 (two) times daily.    Dispense:  6 tablet    Refill:  0    Order Specific Question:  Supervising Provider    Answer:  Crecencio Mc [[id number]     Follow-up: Return if symptoms worsen or fail to improve.

## 2015-05-15 NOTE — Addendum Note (Signed)
Addended by: Aviva Signs M on: 05/15/2015 03:05 PM   Modules accepted: Orders, SmartSet

## 2015-05-15 NOTE — Progress Notes (Signed)
Pre visit review using our clinic review tool, if applicable. No additional management support is needed unless otherwise documented below in the visit note. 

## 2015-05-15 NOTE — Patient Outreach (Signed)
[facility]) Care Management  05/15/2015  Melissa Myers Jul 10, 1945 [id number]  Subjective: "I feel better than I did last month".  Assessment: 70 year old with recent admission to hospital for bilateral lower leg cellulitis and acute on chronic heart failure. Member reports improvement in condition. Still active with home health. Member states continues to follow up with providers as scheduled. No concerns voiced at this time.  RNCM discussed heart failure zone tool, reinforced importance of daily weights and when to call the doctor.  Plan: home visit next week.  Thea Silversmith, RN, MSN, [place] Coordinator Cell: [phone]'

## 2015-05-15 NOTE — Assessment & Plan Note (Signed)
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 Augmentin BID x 3 days was repeated and sent to pharmacy. Await culture.

## 2015-05-15 NOTE — Progress Notes (Unsigned)
This encounter was created in error - please disregard.

## 2015-05-15 NOTE — Telephone Encounter (Signed)
Patient requested Handicap form. Filled out Handicap Placard form. PCP signed. Copy sent to scan and copy placed in [facility] box with charge capture sheet

## 2015-05-15 NOTE — Telephone Encounter (Signed)
Melissa Myers returned call while in patients home Patient is currently taking metolazone weekly on Saturday only and potassium20 BID  Per vo Oda [place], [place] Patient should increase potassium to 40 meq BID, with an additional 40 on Saturday with metolazone Recheck bmet 3/27

## 2015-05-15 NOTE — Telephone Encounter (Signed)
Larena Glassman, RN left a VM late yesterday to verify pt's KCL dose, she was feeling up pill box for pt and the patient told it the KCL was suppose to change but she didn't know to how much.    Called Larena Glassman back this AM, she states she is the one who feels pt's pill box so if she doesn't know about changes they do not occur and pt does not remember to tell her if we call the patient. She is unsure at this time exactly what dose pt is on, but states she has not changed it recently so she know pt is not on 80 bid and she thinks pt is still doing Metolazone twice a week.  Per phone notes and lab results, we have decreased metolazone and increased KCL 3/8 and 3/17.  She states she will go to pt's house today and call us from there to let us know exactly what pt is taking and we can make changes at that time.

## 2015-05-18 ENCOUNTER — Ambulatory Visit: Payer: PPO | Admitting: Occupational Therapy

## 2015-05-18 ENCOUNTER — Other Ambulatory Visit: Payer: Self-pay

## 2015-05-18 DIAGNOSIS — E1142 Type 2 diabetes mellitus with diabetic polyneuropathy: Secondary | ICD-10-CM | POA: Diagnosis not present

## 2015-05-18 DIAGNOSIS — Z7982 Long term (current) use of aspirin: Secondary | ICD-10-CM | POA: Diagnosis not present

## 2015-05-18 DIAGNOSIS — M47816 Spondylosis without myelopathy or radiculopathy, lumbar region: Secondary | ICD-10-CM | POA: Diagnosis not present

## 2015-05-18 DIAGNOSIS — G4733 Obstructive sleep apnea (adult) (pediatric): Secondary | ICD-10-CM | POA: Diagnosis not present

## 2015-05-18 DIAGNOSIS — L03115 Cellulitis of right lower limb: Secondary | ICD-10-CM | POA: Diagnosis not present

## 2015-05-18 DIAGNOSIS — G2581 Restless legs syndrome: Secondary | ICD-10-CM | POA: Diagnosis not present

## 2015-05-18 DIAGNOSIS — R0602 Shortness of breath: Secondary | ICD-10-CM | POA: Diagnosis not present

## 2015-05-18 DIAGNOSIS — J9690 Respiratory failure, unspecified, unspecified whether with hypoxia or hypercapnia: Secondary | ICD-10-CM | POA: Diagnosis not present

## 2015-05-18 DIAGNOSIS — M170 Bilateral primary osteoarthritis of knee: Secondary | ICD-10-CM | POA: Diagnosis not present

## 2015-05-18 DIAGNOSIS — Z794 Long term (current) use of insulin: Secondary | ICD-10-CM | POA: Diagnosis not present

## 2015-05-18 DIAGNOSIS — I509 Heart failure, unspecified: Secondary | ICD-10-CM | POA: Diagnosis not present

## 2015-05-18 DIAGNOSIS — I5042 Chronic combined systolic (congestive) and diastolic (congestive) heart failure: Secondary | ICD-10-CM | POA: Diagnosis not present

## 2015-05-18 DIAGNOSIS — E785 Hyperlipidemia, unspecified: Secondary | ICD-10-CM | POA: Diagnosis not present

## 2015-05-18 DIAGNOSIS — L03116 Cellulitis of left lower limb: Secondary | ICD-10-CM | POA: Diagnosis not present

## 2015-05-18 DIAGNOSIS — N186 End stage renal disease: Secondary | ICD-10-CM | POA: Diagnosis not present

## 2015-05-18 DIAGNOSIS — Z9981 Dependence on supplemental oxygen: Secondary | ICD-10-CM | POA: Diagnosis not present

## 2015-05-18 DIAGNOSIS — E662 Morbid (severe) obesity with alveolar hypoventilation: Secondary | ICD-10-CM | POA: Diagnosis not present

## 2015-05-18 DIAGNOSIS — I130 Hypertensive heart and chronic kidney disease with heart failure and stage 1 through stage 4 chronic kidney disease, or unspecified chronic kidney disease: Secondary | ICD-10-CM | POA: Diagnosis not present

## 2015-05-18 DIAGNOSIS — E1122 Type 2 diabetes mellitus with diabetic chronic kidney disease: Secondary | ICD-10-CM | POA: Diagnosis not present

## 2015-05-18 LAB — CULTURE, URINE COMPREHENSIVE

## 2015-05-19 ENCOUNTER — Telehealth (HOSPITAL_COMMUNITY): Payer: Self-pay | Admitting: Cardiology

## 2015-05-19 NOTE — Patient Outreach (Signed)
[facility]) Care Management  [place]  05/18/2015   Melissa Myers 08-08-1945 [id number]  Subjective: member reports feeling better. Reports she took a pain pill approximately one hour before Care coordinator visit. Reports relief of leg pain, but states she is very sleepy.  Objective: BP 110/60 mmHg  Pulse 56  Resp 16  Ht 1.473 m (4\' 10" )  Wt 253 lb (114.76 kg)  BMI 52.89 kg/m2  SpO2 96%, lungs clear, respirations even/unlabored,member is drowsy sitting at table.   Current Medications:  Current Outpatient Prescriptions  Medication Sig Dispense Refill  . amoxicillin-clavulanate (AUGMENTIN) 875-125 MG tablet Take 1 tablet by mouth 2 (two) times daily. 6 tablet 0  . aspirin EC 81 MG tablet Take 1 tablet (81 mg total) by mouth daily.    . B Complex-C (B-COMPLEX WITH VITAMIN C) tablet Take 1 tablet by mouth daily.    . BD INSULIN SYRINGE ULTRAFINE 31G X 15/64" 1 ML MISC USE TO INJECT INSULIN 4 TIMES DAILY AS ADVISED. 100 each 4  . carvedilol (COREG) 25 MG tablet TAKE 1 TABLET BY MOUTH TWICE DAILY WITH FOOD 60 tablet 5  . Cholecalciferol (VITAMIN D3) 2000 UNITS capsule Take 2,000 Units by mouth 4 (four) times daily - after meals and at bedtime.     . citalopram (CELEXA) 20 MG tablet Take 1 tablet (20 mg total) by mouth daily. 30 tablet 3  . Coenzyme Q10 200 MG capsule Take 200 mg by mouth daily.    . diclofenac sodium (VOLTAREN) 1 % GEL Apply 2 g topically daily as needed (knee pain). 10 g 3  . DULoxetine (CYMBALTA) 60 MG capsule Take 60 mg by mouth daily.    [name] ezetimibe (ZETIA) 10 MG tablet Take 1 tablet (10 mg total) by mouth daily. 90 tablet 3  . fenofibrate 160 MG tablet TAKE 1 TABLET (160 MG TOTAL) BY MOUTH DAILY. MUST ESTABLISH WITH NEW PCP FOR ADDITIONAL REFILLS. 90 tablet 3  . gabapentin (NEURONTIN) 300 MG capsule Take 2 capsules (600 mg total) by mouth 4 (four) times daily. 240 capsule 0  . glucose blood (ONETOUCH VERIO) test strip Use to check  blood sugar 2 times per day. Dx code: E11.9 200 each 2  . hydrALAZINE (APRESOLINE) 25 MG tablet TAKE 1 TABLET (25 MG TOTAL) BY MOUTH 3 (THREE) TIMES DAILY. 90 tablet 2  . HYDROcodone-acetaminophen (NORCO) 10-325 MG tablet Take 1 tablet by mouth every 6 (six) hours as needed. 120 tablet 0  . hydrOXYzine (ATARAX/VISTARIL) 10 MG tablet Take 10 mg by mouth 3 (three) times daily as needed for anxiety. Reported on 02/10/2015    . insulin NPH Human (HUMULIN N) 100 UNIT/ML injection Inject 1.5 mLs (150 Units total) into the skin daily before breakfast. 50 mL 11  . Insulin Pen Needle 32G X 4 MM MISC Use to inject insulin 1 time per day. 100 each 2  . ketoconazole (NIZORAL) 2 % cream Apply 1 application topically daily. Reported on 03/19/2015    . lactobacillus (FLORANEX/LACTINEX) PACK Take 1 packet (1 g total) by mouth 3 (three) times daily with meals. 30 packet 0  . Magnesium 500 MG TABS Take 500 mg by mouth daily.    . metolazone (ZAROXOLYN) 5 MG tablet TAKE 5mg  (1 tablet) Saturday.  Can take an additional as needed for wt gain 3lbs overnight or 5 lbs within one week. 15 tablet 3  . nystatin-triamcinolone ointment (MYCOLOG) Apply 1 application topically 2 (two) times daily. 60 g 2  .  omega-3 acid ethyl esters (LOVAZA) 1 g capsule Take 1 capsule (1 g total) by mouth 2 (two) times daily. 180 capsule 3  . potassium chloride 20 MEQ TBCR Take 40 mEq by mouth 2 (two) times daily. With an additional 40 on Sat 145 tablet 6  . rOPINIRole (REQUIP) 2 MG tablet Take 1 tablet (2 mg total) by mouth 3 (three) times daily as needed. 270 tablet 3  . spironolactone (ALDACTONE) 25 MG tablet TAKE 1 TABLET BY MOUTH DAILY 30 tablet 2  . torsemide (DEMADEX) 100 MG tablet TAKE 1 TABLET (100 MG TOTAL) BY MOUTH 2 (TWO) TIMES DAILY. 60 tablet 5  . triamcinolone cream (KENALOG) 0.1 % Apply 1 application topically 2 (two) times daily as needed (rash). Reported on 05/04/2015    . vitamin B-12 (CYANOCOBALAMIN) 500 MCG tablet Take 500 mcg  by mouth daily. Reported on 03/19/2015    . insulin regular (NOVOLIN R,HUMULIN R) 100 units/mL injection 70 units before breakfast, 70 units before lunch, 50 units before dinner (Patient not taking: Reported on 05/15/2015) 60 mL 3   No current facility-administered medications for this visit.    Functional Status:  In your present state of health, do you have any difficulty performing the following activities: 05/15/2015 04/14/2015  Hearing? N N  Vision? Y N  Difficulty concentrating or making decisions? N N  Walking or climbing stairs? Y Y  Dressing or bathing? N N  Doing errands, shopping? Tempie Donning  Preparing Food and eating ? Y -  Using the Toilet? N -  In the past six months, have you accidently leaked urine? N -  Do you have problems with loss of bowel control? N -  Managing your Medications? Y -  Managing your Finances? N -  Housekeeping or managing your Housekeeping? Y -    Fall/Depression Screening: PHQ 2/9 Scores 05/15/2015 02/10/2015 12/02/2014 08/20/2014 08/15/2014 07/08/2014 09/10/2013  PHQ - 2 Score 0 0 0 0 0 1 0  PHQ- 9 Score - - - - - - -   Fall Risk  05/15/2015 02/10/2015 12/02/2014 08/15/2014 08/12/2014  Falls in the past year? No No No No No  Number falls in past yr: - - - - -  Injury with Fall? - - - - -  Risk for fall due to : - Impaired mobility Impaired balance/gait;Impaired mobility;Impaired vision;Medication side effect Impaired balance/gait;Medication side effect Impaired mobility;Impaired balance/gait  Risk for fall due to (comments): - discussed fall prevention strategies, active with home health physical therapy. - - -  Follow up - - - - -    Assessment: 70 year old with history of heart failure, diabetes, morbid obesity,sleep apnea, chronic pain. RNCM has been following member for transition of care post hospitalization. Member reports improvement since discharge. Member remains active with home health.  History of Chronic pain-Member is sitting at the table dozing  and reports she took pain medication approximately one hour prior to CM visit and it is making her drowsy. Member reports the medication has relieved her pain.   RNCM discussed weights. Member's reports her  Weight was up this morning. Mr. Grewal reminded member that she drank a lot of liquids on yesterday,  stating she was so thirsty yesterday. Member reports she usually measures her fluid intake, but did not on yesterday.  Blood sugar this morning 241 after eating. Member takes 150 units Humulin N. RNCM encouraged member to continue to check and record blood sugars. Reinforced blood sugar should be less than 180  approximately 2hours after eating.  RNCM discussed Care Connection program. Member agrees to referral to hear more about the program.  Plan: referral to care connection program. Transition of care period complete. Follow up with member next month.  Thea Silversmith, RN, MSN, [place] Coordinator Cell: [phone]

## 2015-05-19 NOTE — Telephone Encounter (Signed)
Abnormal labs received Labs drawn 05/18/15 Cr 2.12 k 4.9 Per vo Amy Clegg,NP Patient should hold torsemide and potassium for 2 days, then resume normal dose  Patient aware and voiced understanding

## 2015-05-20 ENCOUNTER — Telehealth: Payer: Self-pay | Admitting: Endocrinology

## 2015-05-20 ENCOUNTER — Ambulatory Visit: Payer: PPO | Admitting: Occupational Therapy

## 2015-05-20 MED ORDER — INSULIN NPH (HUMAN) (ISOPHANE) 100 UNIT/ML ~~LOC~~ SUSP: 150.0000 [IU] | Freq: Every day | SUBCUTANEOUS | Status: DC

## 2015-05-20 NOTE — Telephone Encounter (Signed)
Patient state that insurance [employer] will not cover her medication Humulin is there any other alternative, please advise

## 2015-05-20 NOTE — Telephone Encounter (Signed)
I contacted the pt and advised the alternative to Humulin N is novolin N. Rx submitted for novolin n. Pt advised to call back if she has any issues picking up her medication.

## 2015-05-21 ENCOUNTER — Other Ambulatory Visit: Payer: Self-pay

## 2015-05-21 ENCOUNTER — Telehealth (HOSPITAL_COMMUNITY): Payer: Self-pay

## 2015-05-21 DIAGNOSIS — Z9981 Dependence on supplemental oxygen: Secondary | ICD-10-CM | POA: Diagnosis not present

## 2015-05-21 DIAGNOSIS — E662 Morbid (severe) obesity with alveolar hypoventilation: Secondary | ICD-10-CM | POA: Diagnosis not present

## 2015-05-21 DIAGNOSIS — E785 Hyperlipidemia, unspecified: Secondary | ICD-10-CM | POA: Diagnosis not present

## 2015-05-21 DIAGNOSIS — E1142 Type 2 diabetes mellitus with diabetic polyneuropathy: Secondary | ICD-10-CM | POA: Diagnosis not present

## 2015-05-21 DIAGNOSIS — G4733 Obstructive sleep apnea (adult) (pediatric): Secondary | ICD-10-CM | POA: Diagnosis not present

## 2015-05-21 DIAGNOSIS — L03116 Cellulitis of left lower limb: Secondary | ICD-10-CM | POA: Diagnosis not present

## 2015-05-21 DIAGNOSIS — Z7982 Long term (current) use of aspirin: Secondary | ICD-10-CM | POA: Diagnosis not present

## 2015-05-21 DIAGNOSIS — E1122 Type 2 diabetes mellitus with diabetic chronic kidney disease: Secondary | ICD-10-CM | POA: Diagnosis not present

## 2015-05-21 DIAGNOSIS — I130 Hypertensive heart and chronic kidney disease with heart failure and stage 1 through stage 4 chronic kidney disease, or unspecified chronic kidney disease: Secondary | ICD-10-CM | POA: Diagnosis not present

## 2015-05-21 DIAGNOSIS — I5042 Chronic combined systolic (congestive) and diastolic (congestive) heart failure: Secondary | ICD-10-CM | POA: Diagnosis not present

## 2015-05-21 DIAGNOSIS — L03115 Cellulitis of right lower limb: Secondary | ICD-10-CM | POA: Diagnosis not present

## 2015-05-21 DIAGNOSIS — M47816 Spondylosis without myelopathy or radiculopathy, lumbar region: Secondary | ICD-10-CM | POA: Diagnosis not present

## 2015-05-21 DIAGNOSIS — N186 End stage renal disease: Secondary | ICD-10-CM | POA: Diagnosis not present

## 2015-05-21 DIAGNOSIS — G2581 Restless legs syndrome: Secondary | ICD-10-CM | POA: Diagnosis not present

## 2015-05-21 DIAGNOSIS — M170 Bilateral primary osteoarthritis of knee: Secondary | ICD-10-CM | POA: Diagnosis not present

## 2015-05-21 DIAGNOSIS — Z794 Long term (current) use of insulin: Secondary | ICD-10-CM | POA: Diagnosis not present

## 2015-05-21 NOTE — Telephone Encounter (Signed)
Larena Glassman [facility] RN with [facility] called to report patient had 4 lb weight gain since holding torsemide two days ago. Confirmed to resume this medication as previously prescribed (100 mg BID ) along with her potassium and redraw CMET on Monday. Aware and agreeable to plan. Also gave VO per Aundra Dubin to recertify patient for continued [facility] RN support.  Renee Pain

## 2015-05-21 NOTE — Patient Outreach (Signed)
[facility]) Care Management  05/21/2015  LAN CHANNELL 26-Jun-1945 [id number]  Care coordination-referral to Care Connection-program with Hospice and [place]. Member would like more information on program and speak with Program representative regarding benefits of Palliative Care Program.  Plan: continue to follow.  Thea Silversmith, RN, MSN, [place] Coordinator Cell: [phone]

## 2015-05-22 ENCOUNTER — Other Ambulatory Visit (HOSPITAL_COMMUNITY): Payer: Self-pay | Admitting: Cardiology

## 2015-05-22 ENCOUNTER — Ambulatory Visit: Payer: PPO | Admitting: Occupational Therapy

## 2015-05-25 ENCOUNTER — Telehealth: Payer: Self-pay | Admitting: Internal Medicine

## 2015-05-25 ENCOUNTER — Ambulatory Visit: Payer: PPO | Admitting: Occupational Therapy

## 2015-05-25 ENCOUNTER — Encounter: Payer: Self-pay | Admitting: Cardiology

## 2015-05-25 DIAGNOSIS — G4733 Obstructive sleep apnea (adult) (pediatric): Secondary | ICD-10-CM | POA: Diagnosis not present

## 2015-05-25 DIAGNOSIS — Z9981 Dependence on supplemental oxygen: Secondary | ICD-10-CM | POA: Diagnosis not present

## 2015-05-25 DIAGNOSIS — E1122 Type 2 diabetes mellitus with diabetic chronic kidney disease: Secondary | ICD-10-CM | POA: Diagnosis not present

## 2015-05-25 DIAGNOSIS — L03115 Cellulitis of right lower limb: Secondary | ICD-10-CM | POA: Diagnosis not present

## 2015-05-25 DIAGNOSIS — G2581 Restless legs syndrome: Secondary | ICD-10-CM | POA: Diagnosis not present

## 2015-05-25 DIAGNOSIS — E1142 Type 2 diabetes mellitus with diabetic polyneuropathy: Secondary | ICD-10-CM | POA: Diagnosis not present

## 2015-05-25 DIAGNOSIS — M47816 Spondylosis without myelopathy or radiculopathy, lumbar region: Secondary | ICD-10-CM | POA: Diagnosis not present

## 2015-05-25 DIAGNOSIS — I130 Hypertensive heart and chronic kidney disease with heart failure and stage 1 through stage 4 chronic kidney disease, or unspecified chronic kidney disease: Secondary | ICD-10-CM | POA: Diagnosis not present

## 2015-05-25 DIAGNOSIS — Z794 Long term (current) use of insulin: Secondary | ICD-10-CM | POA: Diagnosis not present

## 2015-05-25 DIAGNOSIS — L03116 Cellulitis of left lower limb: Secondary | ICD-10-CM | POA: Diagnosis not present

## 2015-05-25 DIAGNOSIS — N186 End stage renal disease: Secondary | ICD-10-CM | POA: Diagnosis not present

## 2015-05-25 DIAGNOSIS — M170 Bilateral primary osteoarthritis of knee: Secondary | ICD-10-CM | POA: Diagnosis not present

## 2015-05-25 DIAGNOSIS — N183 Chronic kidney disease, stage 3 (moderate): Secondary | ICD-10-CM | POA: Diagnosis not present

## 2015-05-25 DIAGNOSIS — E662 Morbid (severe) obesity with alveolar hypoventilation: Secondary | ICD-10-CM | POA: Diagnosis not present

## 2015-05-25 DIAGNOSIS — I5042 Chronic combined systolic (congestive) and diastolic (congestive) heart failure: Secondary | ICD-10-CM | POA: Diagnosis not present

## 2015-05-25 DIAGNOSIS — E785 Hyperlipidemia, unspecified: Secondary | ICD-10-CM | POA: Diagnosis not present

## 2015-05-25 DIAGNOSIS — Z7982 Long term (current) use of aspirin: Secondary | ICD-10-CM | POA: Diagnosis not present

## 2015-05-25 NOTE — Telephone Encounter (Signed)
Please advise, thanks.

## 2015-05-25 NOTE — Telephone Encounter (Signed)
States patient was referred to in home palliative care.  Wanted to make sure Dr. Sharlet Salina would sign off on.

## 2015-05-27 ENCOUNTER — Telehealth: Payer: Self-pay | Admitting: Internal Medicine

## 2015-05-27 ENCOUNTER — Ambulatory Visit: Payer: PPO | Admitting: Occupational Therapy

## 2015-05-27 DIAGNOSIS — L03116 Cellulitis of left lower limb: Secondary | ICD-10-CM | POA: Diagnosis not present

## 2015-05-27 DIAGNOSIS — I130 Hypertensive heart and chronic kidney disease with heart failure and stage 1 through stage 4 chronic kidney disease, or unspecified chronic kidney disease: Secondary | ICD-10-CM | POA: Diagnosis not present

## 2015-05-27 DIAGNOSIS — I5042 Chronic combined systolic (congestive) and diastolic (congestive) heart failure: Secondary | ICD-10-CM | POA: Diagnosis not present

## 2015-05-27 DIAGNOSIS — L03115 Cellulitis of right lower limb: Secondary | ICD-10-CM | POA: Diagnosis not present

## 2015-05-27 NOTE — Telephone Encounter (Signed)
Melissa Myers is calling to report some drug interactions  Level 2 celexa and hydroxyzine  Potassium and aldactone

## 2015-05-28 ENCOUNTER — Other Ambulatory Visit (HOSPITAL_COMMUNITY): Payer: Self-pay | Admitting: Student

## 2015-05-28 DIAGNOSIS — Z7901 Long term (current) use of anticoagulants: Secondary | ICD-10-CM | POA: Diagnosis not present

## 2015-05-28 DIAGNOSIS — E785 Hyperlipidemia, unspecified: Secondary | ICD-10-CM | POA: Diagnosis not present

## 2015-05-28 DIAGNOSIS — L03116 Cellulitis of left lower limb: Secondary | ICD-10-CM | POA: Diagnosis not present

## 2015-05-28 DIAGNOSIS — Z5181 Encounter for therapeutic drug level monitoring: Secondary | ICD-10-CM | POA: Diagnosis not present

## 2015-05-28 DIAGNOSIS — Z9981 Dependence on supplemental oxygen: Secondary | ICD-10-CM | POA: Diagnosis not present

## 2015-05-28 DIAGNOSIS — G2581 Restless legs syndrome: Secondary | ICD-10-CM | POA: Diagnosis not present

## 2015-05-28 DIAGNOSIS — M47816 Spondylosis without myelopathy or radiculopathy, lumbar region: Secondary | ICD-10-CM | POA: Diagnosis not present

## 2015-05-28 DIAGNOSIS — L03115 Cellulitis of right lower limb: Secondary | ICD-10-CM | POA: Diagnosis not present

## 2015-05-28 DIAGNOSIS — E1142 Type 2 diabetes mellitus with diabetic polyneuropathy: Secondary | ICD-10-CM | POA: Diagnosis not present

## 2015-05-28 DIAGNOSIS — Z87440 Personal history of urinary (tract) infections: Secondary | ICD-10-CM | POA: Diagnosis not present

## 2015-05-28 DIAGNOSIS — Z7982 Long term (current) use of aspirin: Secondary | ICD-10-CM | POA: Diagnosis not present

## 2015-05-28 DIAGNOSIS — Z794 Long term (current) use of insulin: Secondary | ICD-10-CM | POA: Diagnosis not present

## 2015-05-28 DIAGNOSIS — E1122 Type 2 diabetes mellitus with diabetic chronic kidney disease: Secondary | ICD-10-CM | POA: Diagnosis not present

## 2015-05-28 DIAGNOSIS — E662 Morbid (severe) obesity with alveolar hypoventilation: Secondary | ICD-10-CM | POA: Diagnosis not present

## 2015-05-28 DIAGNOSIS — M170 Bilateral primary osteoarthritis of knee: Secondary | ICD-10-CM | POA: Diagnosis not present

## 2015-05-28 DIAGNOSIS — I130 Hypertensive heart and chronic kidney disease with heart failure and stage 1 through stage 4 chronic kidney disease, or unspecified chronic kidney disease: Secondary | ICD-10-CM | POA: Diagnosis not present

## 2015-05-28 DIAGNOSIS — G4733 Obstructive sleep apnea (adult) (pediatric): Secondary | ICD-10-CM | POA: Diagnosis not present

## 2015-05-28 DIAGNOSIS — N183 Chronic kidney disease, stage 3 (moderate): Secondary | ICD-10-CM | POA: Diagnosis not present

## 2015-05-28 DIAGNOSIS — I5042 Chronic combined systolic (congestive) and diastolic (congestive) heart failure: Secondary | ICD-10-CM | POA: Diagnosis not present

## 2015-05-28 NOTE — Telephone Encounter (Signed)
Left message for Melissa Myers to call me back to discuss drug interactions.

## 2015-05-29 ENCOUNTER — Ambulatory Visit: Payer: PPO | Admitting: Occupational Therapy

## 2015-05-29 NOTE — Telephone Encounter (Signed)
Returning call.

## 2015-06-01 ENCOUNTER — Ambulatory Visit: Payer: PPO | Admitting: Occupational Therapy

## 2015-06-01 ENCOUNTER — Other Ambulatory Visit: Payer: Self-pay

## 2015-06-01 NOTE — Patient Outreach (Signed)
[facility]) Care Management  06/01/2015  Melissa Myers January 07, 1946 [id number]  RNCM called and spoke with Care Connections-Received information that member is eligible for the program, that they are awaiting discussion with member's primary care who has been out of town.  Plan: RNCM will continue to follow.   Thea Silversmith, RN, MSN, [place] Coordinator Cell: [phone]

## 2015-06-01 NOTE — Telephone Encounter (Signed)
Yes, that's fine 

## 2015-06-01 NOTE — Telephone Encounter (Signed)
Left message with Manus Gunning at palliative care informing her that Dr. Sharlet Salina will sign off on orders.

## 2015-06-01 NOTE — Telephone Encounter (Signed)
Left message for Melissa Myers to call me back 

## 2015-06-01 NOTE — Patient Outreach (Signed)
[place] [facility]) Care Management  06/01/2015  TYSHONNA TIANO March 31, 1945 [id number]  RNCM called to follow up. Member reports she remains active with home health nurse and Paramedicine. Member denies any changes at this time stating she is no better and no worse.   RNCM updated member to be expecting a call within the next 1-2 weeks-Pending Care Connections follow up with primary care who has been out of town. Member acknowledged understanding.  Plan: follow up by the end of the month.  Thea Silversmith, RN, MSN, [place] Coordinator Cell: [phone]

## 2015-06-01 NOTE — Telephone Encounter (Signed)
Inform palliative care place.

## 2015-06-03 ENCOUNTER — Ambulatory Visit: Payer: PPO | Admitting: Occupational Therapy

## 2015-06-04 DIAGNOSIS — E1142 Type 2 diabetes mellitus with diabetic polyneuropathy: Secondary | ICD-10-CM | POA: Diagnosis not present

## 2015-06-04 DIAGNOSIS — Z7982 Long term (current) use of aspirin: Secondary | ICD-10-CM | POA: Diagnosis not present

## 2015-06-04 DIAGNOSIS — L03115 Cellulitis of right lower limb: Secondary | ICD-10-CM | POA: Diagnosis not present

## 2015-06-04 DIAGNOSIS — N183 Chronic kidney disease, stage 3 (moderate): Secondary | ICD-10-CM | POA: Diagnosis not present

## 2015-06-04 DIAGNOSIS — Z7901 Long term (current) use of anticoagulants: Secondary | ICD-10-CM | POA: Diagnosis not present

## 2015-06-04 DIAGNOSIS — E662 Morbid (severe) obesity with alveolar hypoventilation: Secondary | ICD-10-CM | POA: Diagnosis not present

## 2015-06-04 DIAGNOSIS — I5042 Chronic combined systolic (congestive) and diastolic (congestive) heart failure: Secondary | ICD-10-CM | POA: Diagnosis not present

## 2015-06-04 DIAGNOSIS — Z794 Long term (current) use of insulin: Secondary | ICD-10-CM | POA: Diagnosis not present

## 2015-06-04 DIAGNOSIS — E1122 Type 2 diabetes mellitus with diabetic chronic kidney disease: Secondary | ICD-10-CM | POA: Diagnosis not present

## 2015-06-04 DIAGNOSIS — L03116 Cellulitis of left lower limb: Secondary | ICD-10-CM | POA: Diagnosis not present

## 2015-06-04 DIAGNOSIS — Z5181 Encounter for therapeutic drug level monitoring: Secondary | ICD-10-CM | POA: Diagnosis not present

## 2015-06-04 DIAGNOSIS — M170 Bilateral primary osteoarthritis of knee: Secondary | ICD-10-CM | POA: Diagnosis not present

## 2015-06-04 DIAGNOSIS — M47816 Spondylosis without myelopathy or radiculopathy, lumbar region: Secondary | ICD-10-CM | POA: Diagnosis not present

## 2015-06-04 DIAGNOSIS — G4733 Obstructive sleep apnea (adult) (pediatric): Secondary | ICD-10-CM | POA: Diagnosis not present

## 2015-06-04 DIAGNOSIS — E785 Hyperlipidemia, unspecified: Secondary | ICD-10-CM | POA: Diagnosis not present

## 2015-06-04 DIAGNOSIS — G2581 Restless legs syndrome: Secondary | ICD-10-CM | POA: Diagnosis not present

## 2015-06-04 DIAGNOSIS — I130 Hypertensive heart and chronic kidney disease with heart failure and stage 1 through stage 4 chronic kidney disease, or unspecified chronic kidney disease: Secondary | ICD-10-CM | POA: Diagnosis not present

## 2015-06-04 DIAGNOSIS — Z87440 Personal history of urinary (tract) infections: Secondary | ICD-10-CM | POA: Diagnosis not present

## 2015-06-04 DIAGNOSIS — Z9981 Dependence on supplemental oxygen: Secondary | ICD-10-CM | POA: Diagnosis not present

## 2015-06-05 ENCOUNTER — Ambulatory Visit: Payer: PPO | Admitting: Occupational Therapy

## 2015-06-09 ENCOUNTER — Encounter: Payer: Self-pay | Admitting: Endocrinology

## 2015-06-09 ENCOUNTER — Ambulatory Visit (INDEPENDENT_AMBULATORY_CARE_PROVIDER_SITE_OTHER): Payer: PPO | Admitting: Endocrinology

## 2015-06-09 VITALS — BP 140/80 | HR 67 | Temp 98.4°F

## 2015-06-09 DIAGNOSIS — Z794 Long term (current) use of insulin: Secondary | ICD-10-CM

## 2015-06-09 DIAGNOSIS — N184 Chronic kidney disease, stage 4 (severe): Secondary | ICD-10-CM | POA: Diagnosis not present

## 2015-06-09 DIAGNOSIS — E1122 Type 2 diabetes mellitus with diabetic chronic kidney disease: Secondary | ICD-10-CM

## 2015-06-09 MED ORDER — INSULIN NPH (HUMAN) (ISOPHANE) 100 UNIT/ML ~~LOC~~ SUSP: 180.0000 [IU] | SUBCUTANEOUS | Status: DC

## 2015-06-09 NOTE — Progress Notes (Signed)
Subjective:    Patient ID: Melissa Myers, female    DOB: 1945-06-27, 70 y.o.   MRN: [id number]  HPI Pt returns for f/u of diabetes mellitus: DM type: Insulin-requiring type 2.   Dx'ed: [id number] Complications: sensory neuropathy of the lower extremities, renal insufficiency, and leg ulcers. Therapy: insulin since 2011 GDM: never DKA: never Severe hypoglycemia: several times; most recently in early 2017.  Pancreatitis: never Other: she takes QD insulin, after poor results with multiple daily injections; she is too ill to undergo weight loss surgery.  she takes human insulin, due to cost.   Interval history: she brings a record of her cbg's which i have reviewed today.  It varies from 120-500.  It is in general higher as the day goes on.   pt states she feels well in general.   Past Medical History  Diagnosis Date  . Type II or unspecified type diabetes mellitus without mention of complication, not stated as uncontrolled     insulin dep  . Hyperlipidemia     hx rhabdo on statins  . Hypertension   . Diastolic CHF ([place])   . Chronic diarrhea     a/w nausea - felt related to IBS  . RLS (restless legs syndrome)   . Osteoarthritis   . Stasis dermatitis   . GERD (gastroesophageal reflux disease)   . On home oxygen therapy     uses oxygen 2 liters min per [place] at night and prn during day  . Anemia   . Neuropathy (H[facility])     feet, toes and fingers  . Disc degeneration, lumbar   . OSA (obstructive sleep apnea)     05/2009 sleep study - refuses CPAP  . Deaf     left side only  . Shortness of breath     chronic  . Cellulitis     LOWER EXTREMITIES    Past Surgical History  Procedure Laterality Date  . Tubal ligation  1980  . Cholecystectomy  1997  . Uterine polyp removal  2008  . Umbilical hernia repair  1995  . Tonsillectomy  1970  . Colonoscopy N/A 12/03/2012    Procedure: COLONOSCOPY;  Surgeon: Lafayette Dragon, MD;  Location: [facility];  Service: Endoscopy;  Laterality: N/A;      Social History   Social History  . Marital Status: Married    Spouse Name: N/A  . Number of Children: N/A  . Years of Education: N/A   Occupational History  . Not on file.   Social History Main Topics  . Smoking status: Never Smoker   . Smokeless tobacco: Never Used  . Alcohol Use: No  . Drug Use: No  . Sexual Activity: No   Other Topics Concern  . Not on file   Social History Narrative   Lives with spouse and mother. Retired Network engineer, now housewife    Current Outpatient Prescriptions on File Prior to Visit  Medication Sig Dispense Refill  . aspirin EC 81 MG tablet Take 1 tablet (81 mg total) by mouth daily.    . B Complex-C (B-COMPLEX WITH VITAMIN C) tablet Take 1 tablet by mouth daily.    . BD INSULIN SYRINGE ULTRAFINE 31G X 15/64" 1 ML MISC USE TO INJECT INSULIN 4 TIMES DAILY AS ADVISED. 100 each 4  . carvedilol (COREG) 25 MG tablet TAKE 1 TABLET BY MOUTH TWICE DAILY WITH FOOD 60 tablet 5  . Cholecalciferol (VITAMIN D3) 2000 UNITS capsule Take 2,000 Units by mouth 4 (four)  times daily - after meals and at bedtime.     . citalopram (CELEXA) 20 MG tablet Take 1 tablet (20 mg total) by mouth daily. 30 tablet 3  . Coenzyme Q10 200 MG capsule Take 200 mg by mouth daily.    . diclofenac sodium (VOLTAREN) 1 % GEL Apply 2 g topically daily as needed (knee pain). 10 g 3  . DULoxetine (CYMBALTA) 60 MG capsule Take 60 mg by mouth daily.    [name] ezetimibe (ZETIA) 10 MG tablet Take 1 tablet (10 mg total) by mouth daily. 90 tablet 3  . fenofibrate 160 MG tablet TAKE 1 TABLET (160 MG TOTAL) BY MOUTH DAILY. MUST ESTABLISH WITH NEW PCP FOR ADDITIONAL REFILLS. 90 tablet 3  . gabapentin (NEURONTIN) 300 MG capsule TAKE 2 CAPSULES (600 MG TOTAL) BY MOUTH 4 (FOUR) TIMES DAILY. 240 capsule 0  . glucose blood (ONETOUCH VERIO) test strip Use to check blood sugar 2 times per day. Dx code: E11.9 200 each 2  . hydrALAZINE (APRESOLINE) 25 MG tablet TAKE 1 TABLET (25 MG TOTAL) BY MOUTH 3 (THREE)  TIMES DAILY. 90 tablet 2  . HYDROcodone-acetaminophen (NORCO) 10-325 MG tablet Take 1 tablet by mouth every 6 (six) hours as needed. 120 tablet 0  . hydrOXYzine (ATARAX/VISTARIL) 10 MG tablet Take 10 mg by mouth 3 (three) times daily as needed for anxiety. Reported on 02/10/2015    . Insulin Pen Needle 32G X 4 MM MISC Use to inject insulin 1 time per day. 100 each 2  . ketoconazole (NIZORAL) 2 % cream Apply 1 application topically daily. Reported on 03/19/2015    . lactobacillus (FLORANEX/LACTINEX) PACK Take 1 packet (1 g total) by mouth 3 (three) times daily with meals. 30 packet 0  . Magnesium 500 MG TABS Take 500 mg by mouth daily.    . metolazone (ZAROXOLYN) 5 MG tablet TAKE 5mg  (1 tablet) Saturday.  Can take an additional as needed for wt gain 3lbs overnight or 5 lbs within one week. 15 tablet 3  . nystatin-triamcinolone ointment (MYCOLOG) Apply 1 application topically 2 (two) times daily. 60 g 2  . omega-3 acid ethyl esters (LOVAZA) 1 g capsule Take 1 capsule (1 g total) by mouth 2 (two) times daily. 180 capsule 3  . potassium chloride 20 MEQ TBCR Take 40 mEq by mouth 2 (two) times daily. With an additional 40 on Sat 145 tablet 6  . rOPINIRole (REQUIP) 2 MG tablet Take 1 tablet (2 mg total) by mouth 3 (three) times daily as needed. 270 tablet 3  . spironolactone (ALDACTONE) 25 MG tablet TAKE 1 TABLET BY MOUTH DAILY 30 tablet 2  . torsemide (DEMADEX) 100 MG tablet TAKE 1 TABLET (100 MG TOTAL) BY MOUTH 2 (TWO) TIMES DAILY. 60 tablet 5  . triamcinolone cream (KENALOG) 0.1 % Apply 1 application topically 2 (two) times daily as needed (rash). Reported on 05/04/2015    . vitamin B-12 (CYANOCOBALAMIN) 500 MCG tablet Take 500 mcg by mouth daily. Reported on 03/19/2015     No current facility-administered medications on file prior to visit.    Allergies  Allergen Reactions  . Cymbalta [Duloxetine Hcl] Other (See Comments)    Restless leg syndrome  . Statins Other (See Comments)    Statin drugs  cause muscle pain / "muscle damage"--was told by MD not to take  . Sulfa Antibiotics Diarrhea  . Levemir [Insulin Detemir] Itching  . Morphine Other (See Comments)    GI upset and headaches  . Zinc Swelling  and Rash    Family History  Problem Relation Age of Onset  . Heart disease Mother   . Heart disease Father   . Heart disease      family history  . Stomach cancer Paternal Grandmother   . Lung cancer Paternal Grandfather   . Colon cancer Neg Hx   . CVA      several aunts  . Heart attack Mother 71  . Heart attack Father 40  . Heart attack      several aunts and an uncle    BP 140/80 mmHg  Pulse 67  Temp(Src) 98.4 F (36.9 C) (Oral)  SpO2 94%  Review of Systems She denies hypoglycemia    Objective:   Physical Exam VITAL SIGNS: See vs page GENERAL: no distress. Morbid obesity: in wheelchair.  Pulses: dorsalis pedis intact bilat.   MSK: no deformity of the feet  CV: 2+ bilat leg edema  Skin: no ulcer on the feet, but the skin is dry. normal temp on the feet. The legs have bilat spotty hyperpigmentation, and both feet have erythema when held dependent.  Neuro: sensation is intact to touch on the feet, but decreased from normal.  Ext: There is bilateral onychomycosis of the toenails, and heavy calluses.       Assessment & Plan:  DM: she needs increased rx  Patient is advised the following: Patient Instructions  check your blood sugar twice a day.  vary the time of day when you check, between before the 3 meals, and at bedtime.  also check if you have symptoms of your blood sugar being too high or too low.  please keep a record of the readings and bring it to your next appointment here.  You can write it on any piece of paper.  please call us sooner if your blood sugar goes below 70, or if you have a lot of readings over 200.  Please increase the insulin to 180 units each morning.   Please come back for a follow-up appointment in 1 month.

## 2015-06-09 NOTE — Patient Instructions (Addendum)
check your blood sugar twice a day.  vary the time of day when you check, between before the 3 meals, and at bedtime.  also check if you have symptoms of your blood sugar being too high or too low.  please keep a record of the readings and bring it to your next appointment here.  You can write it on any piece of paper.  please call us sooner if your blood sugar goes below 70, or if you have a lot of readings over 200.  Please increase the insulin to 180 units each morning.   Please come back for a follow-up appointment in 1 month.

## 2015-06-10 ENCOUNTER — Encounter (HOSPITAL_COMMUNITY): Payer: PPO

## 2015-06-11 ENCOUNTER — Telehealth: Payer: Self-pay | Admitting: Internal Medicine

## 2015-06-11 ENCOUNTER — Encounter (HOSPITAL_COMMUNITY): Payer: Self-pay | Admitting: *Deleted

## 2015-06-11 ENCOUNTER — Emergency Department (HOSPITAL_COMMUNITY)
Admission: EM | Admit: 2015-06-11 | Discharge: 2015-06-11 | Disposition: A | Discharge status: Home / Self Care | Payer: PPO | Attending: Emergency Medicine | Admitting: Emergency Medicine

## 2015-06-11 DIAGNOSIS — E785 Hyperlipidemia, unspecified: Secondary | ICD-10-CM | POA: Diagnosis not present

## 2015-06-11 DIAGNOSIS — N898 Other specified noninflammatory disorders of vagina: Secondary | ICD-10-CM | POA: Diagnosis not present

## 2015-06-11 DIAGNOSIS — R739 Hyperglycemia, unspecified: Secondary | ICD-10-CM

## 2015-06-11 DIAGNOSIS — R300 Dysuria: Secondary | ICD-10-CM | POA: Diagnosis not present

## 2015-06-11 DIAGNOSIS — R42 Dizziness and giddiness: Secondary | ICD-10-CM | POA: Diagnosis not present

## 2015-06-11 DIAGNOSIS — E1165 Type 2 diabetes mellitus with hyperglycemia: Secondary | ICD-10-CM | POA: Insufficient documentation

## 2015-06-11 DIAGNOSIS — Z862 Personal history of diseases of the blood and blood-forming organs and certain disorders involving the immune mechanism: Secondary | ICD-10-CM | POA: Diagnosis not present

## 2015-06-11 DIAGNOSIS — Z79899 Other long term (current) drug therapy: Secondary | ICD-10-CM | POA: Diagnosis not present

## 2015-06-11 DIAGNOSIS — R1030 Lower abdominal pain, unspecified: Secondary | ICD-10-CM | POA: Diagnosis not present

## 2015-06-11 DIAGNOSIS — Z872 Personal history of diseases of the skin and subcutaneous tissue: Secondary | ICD-10-CM | POA: Diagnosis not present

## 2015-06-11 DIAGNOSIS — I10 Essential (primary) hypertension: Secondary | ICD-10-CM | POA: Insufficient documentation

## 2015-06-11 DIAGNOSIS — R011 Cardiac murmur, unspecified: Secondary | ICD-10-CM | POA: Insufficient documentation

## 2015-06-11 DIAGNOSIS — Z794 Long term (current) use of insulin: Secondary | ICD-10-CM | POA: Insufficient documentation

## 2015-06-11 DIAGNOSIS — I5030 Unspecified diastolic (congestive) heart failure: Secondary | ICD-10-CM | POA: Insufficient documentation

## 2015-06-11 DIAGNOSIS — H9192 Unspecified hearing loss, left ear: Secondary | ICD-10-CM | POA: Diagnosis not present

## 2015-06-11 DIAGNOSIS — G2581 Restless legs syndrome: Secondary | ICD-10-CM | POA: Diagnosis not present

## 2015-06-11 DIAGNOSIS — M1990 Unspecified osteoarthritis, unspecified site: Secondary | ICD-10-CM | POA: Insufficient documentation

## 2015-06-11 DIAGNOSIS — G8929 Other chronic pain: Secondary | ICD-10-CM | POA: Diagnosis not present

## 2015-06-11 DIAGNOSIS — I5042 Chronic combined systolic (congestive) and diastolic (congestive) heart failure: Secondary | ICD-10-CM | POA: Diagnosis not present

## 2015-06-11 DIAGNOSIS — Z7982 Long term (current) use of aspirin: Secondary | ICD-10-CM | POA: Diagnosis not present

## 2015-06-11 DIAGNOSIS — E669 Obesity, unspecified: Secondary | ICD-10-CM

## 2015-06-11 DIAGNOSIS — J029 Acute pharyngitis, unspecified: Secondary | ICD-10-CM | POA: Diagnosis not present

## 2015-06-11 DIAGNOSIS — L03116 Cellulitis of left lower limb: Secondary | ICD-10-CM | POA: Diagnosis not present

## 2015-06-11 DIAGNOSIS — G4733 Obstructive sleep apnea (adult) (pediatric): Secondary | ICD-10-CM | POA: Diagnosis not present

## 2015-06-11 DIAGNOSIS — M47816 Spondylosis without myelopathy or radiculopathy, lumbar region: Secondary | ICD-10-CM | POA: Diagnosis not present

## 2015-06-11 DIAGNOSIS — G629 Polyneuropathy, unspecified: Secondary | ICD-10-CM | POA: Insufficient documentation

## 2015-06-11 DIAGNOSIS — E1122 Type 2 diabetes mellitus with diabetic chronic kidney disease: Secondary | ICD-10-CM | POA: Diagnosis not present

## 2015-06-11 DIAGNOSIS — R600 Localized edema: Secondary | ICD-10-CM | POA: Diagnosis not present

## 2015-06-11 DIAGNOSIS — N183 Chronic kidney disease, stage 3 (moderate): Secondary | ICD-10-CM | POA: Diagnosis not present

## 2015-06-11 DIAGNOSIS — R32 Unspecified urinary incontinence: Secondary | ICD-10-CM | POA: Diagnosis not present

## 2015-06-11 DIAGNOSIS — Z7901 Long term (current) use of anticoagulants: Secondary | ICD-10-CM | POA: Diagnosis not present

## 2015-06-11 DIAGNOSIS — Z5181 Encounter for therapeutic drug level monitoring: Secondary | ICD-10-CM | POA: Diagnosis not present

## 2015-06-11 DIAGNOSIS — I130 Hypertensive heart and chronic kidney disease with heart failure and stage 1 through stage 4 chronic kidney disease, or unspecified chronic kidney disease: Secondary | ICD-10-CM | POA: Diagnosis not present

## 2015-06-11 DIAGNOSIS — L03115 Cellulitis of right lower limb: Secondary | ICD-10-CM | POA: Diagnosis not present

## 2015-06-11 DIAGNOSIS — E662 Morbid (severe) obesity with alveolar hypoventilation: Secondary | ICD-10-CM | POA: Diagnosis not present

## 2015-06-11 DIAGNOSIS — Z87440 Personal history of urinary (tract) infections: Secondary | ICD-10-CM | POA: Diagnosis not present

## 2015-06-11 DIAGNOSIS — Z9981 Dependence on supplemental oxygen: Secondary | ICD-10-CM | POA: Insufficient documentation

## 2015-06-11 DIAGNOSIS — M170 Bilateral primary osteoarthritis of knee: Secondary | ICD-10-CM | POA: Diagnosis not present

## 2015-06-11 DIAGNOSIS — E1142 Type 2 diabetes mellitus with diabetic polyneuropathy: Secondary | ICD-10-CM | POA: Diagnosis not present

## 2015-06-11 LAB — I-STAT ARTERIAL BLOOD GAS, ED
ACID-BASE EXCESS: 10 mmol/L — AB (ref 0.0–2.0)
Bicarbonate: 36.9 mEq/L — ABNORMAL HIGH (ref 20.0–24.0)
O2 SAT: 93 %
PH ART: 7.423 (ref 7.350–7.450)
TCO2: 39 mmol/L (ref 0–100)
pCO2 arterial: 56 mmHg — ABNORMAL HIGH (ref 35.0–45.0)
pO2, Arterial: 64 mmHg — ABNORMAL LOW (ref 80.0–100.0)

## 2015-06-11 LAB — WET PREP, GENITAL
Clue Cells Wet Prep HPF POC: NONE SEEN
Sperm: NONE SEEN
Trich, Wet Prep: NONE SEEN
WBC, Wet Prep HPF POC: NONE SEEN
Yeast Wet Prep HPF POC: NONE SEEN

## 2015-06-11 LAB — CBC
HEMATOCRIT: 37.9 % (ref 36.0–46.0)
Hemoglobin: 12.1 g/dL (ref 12.0–15.0)
MCH: 26.1 pg (ref 26.0–34.0)
MCHC: 31.9 g/dL (ref 30.0–36.0)
MCV: 81.7 fL (ref 78.0–100.0)
PLATELETS: 330 10*3/uL (ref 150–400)
RBC: 4.64 MIL/uL (ref 3.87–5.11)
RDW: 15 % (ref 11.5–15.5)
WBC: 6.8 10*3/uL (ref 4.0–10.5)

## 2015-06-11 LAB — URINALYSIS, ROUTINE W REFLEX MICROSCOPIC
BILIRUBIN URINE: NEGATIVE
HGB URINE DIPSTICK: NEGATIVE
KETONES UR: NEGATIVE mg/dL
Nitrite: NEGATIVE
PH: 6.5 (ref 5.0–8.0)
PROTEIN: NEGATIVE mg/dL
Specific Gravity, Urine: 1.016 (ref 1.005–1.030)

## 2015-06-11 LAB — BASIC METABOLIC PANEL
Anion gap: 16 — ABNORMAL HIGH (ref 5–15)
BUN: 57 mg/dL — AB (ref 6–20)
CHLORIDE: 85 mmol/L — AB (ref 101–111)
CO2: 33 mmol/L — AB (ref 22–32)
CREATININE: 1.74 mg/dL — AB (ref 0.44–1.00)
Calcium: 9.4 mg/dL (ref 8.9–10.3)
GFR calc Af Amer: 33 mL/min — ABNORMAL LOW (ref >60)
GFR calc non Af Amer: 29 mL/min — ABNORMAL LOW (ref >60)
GLUCOSE: 549 mg/dL — AB (ref 65–99)
POTASSIUM: 4.6 mmol/L (ref 3.5–5.1)
SODIUM: 134 mmol/L — AB (ref 135–145)

## 2015-06-11 LAB — CBG MONITORING, ED
GLUCOSE-CAPILLARY: 238 mg/dL — AB (ref 65–99)
Glucose-Capillary: 513 mg/dL — ABNORMAL HIGH (ref 65–99)
Glucose-Capillary: 455 mg/dL — ABNORMAL HIGH (ref 65–99)
Glucose-Capillary: 321 mg/dL — ABNORMAL HIGH (ref 65–99)

## 2015-06-11 LAB — URINE MICROSCOPIC-ADD ON: RBC / HPF: NONE SEEN RBC/hpf (ref 0–5)

## 2015-06-11 MED ORDER — SODIUM CHLORIDE 0.9 % IV BOLUS (SEPSIS)
1000.0000 mL | Freq: Once | INTRAVENOUS | Status: AC
  Administered 2015-06-11: 1000 mL via INTRAVENOUS

## 2015-06-11 MED ORDER — INSULIN ASPART 100 UNIT/ML ~~LOC~~ SOLN
7.0000 [IU] | Freq: Once | SUBCUTANEOUS | Status: AC
  Administered 2015-06-11: 7 [IU] via INTRAVENOUS
  Filled 2015-06-11: qty 1

## 2015-06-11 MED ORDER — INSULIN ASPART 100 UNIT/ML ~~LOC~~ SOLN
10.0000 [IU] | Freq: Once | SUBCUTANEOUS | Status: AC
  Administered 2015-06-11: 10 [IU] via INTRAVENOUS
  Filled 2015-06-11: qty 1

## 2015-06-11 NOTE — ED Provider Notes (Signed)
CSN: [id number]     Arrival date & time 06/11/15  1448 History   First MD Initiated Contact with Patient 06/11/15 1525     Chief Complaint  Patient presents with  . Hyperglycemia     (Consider location/radiation/quality/duration/timing/severity/associated sxs/prior Treatment) HPI Comments: Patient with history of diabetes and heart failure reports to ED with complaint of hyperglycemia. Today, home health nurse checked CBG and monitor reported "in excess of 600," advised to come to ED. Recent change to insulin regimen, was previously on four insulin shots daily; however, in the last month has been on single 180 unit dose of humulin. Sugars have been consistently running in the 200-400 range. Associated symptoms include fatigue, generalized weakness, mild headaches, blurry vision, sore throat, dysuria, and dizziness. Denies fevers, chest pain, palpitations, shortness of breath, difficulty breathing, or cough.   Patient is a 70 y.o. female presenting with hyperglycemia. The history is provided by the patient and the spouse.  Hyperglycemia Progression:  Worsening Current diabetic therapy:  Long acting insulin Context: change in medication   Associated symptoms: abdominal pain ( lower abdominal pain, chronic), dizziness, dysuria ( off and on for last six months), fatigue, increased thirst, polyuria and weakness ( generalized)   Associated symptoms: no chest pain, no diaphoresis, no fever, no nausea, no shortness of breath and no vomiting     Past Medical History  Diagnosis Date  . Type II or unspecified type diabetes mellitus without mention of complication, not stated as uncontrolled     insulin dep  . Hyperlipidemia     hx rhabdo on statins  . Hypertension   . Diastolic CHF ([place])   . Chronic diarrhea     a/w nausea - felt related to IBS  . RLS (restless legs syndrome)   . Osteoarthritis   . Stasis dermatitis   . GERD (gastroesophageal reflux disease)   . On home oxygen therapy      uses oxygen 2 liters min per [place] at night and prn during day  . Anemia   . Neuropathy (H[facility])     feet, toes and fingers  . Disc degeneration, lumbar   . OSA (obstructive sleep apnea)     05/2009 sleep study - refuses CPAP  . Deaf     left side only  . Shortness of breath     chronic  . Cellulitis     LOWER EXTREMITIES   Past Surgical History  Procedure Laterality Date  . Tubal ligation  1980  . Cholecystectomy  1997  . Uterine polyp removal  2008  . Umbilical hernia repair  1995  . Tonsillectomy  1970  . Colonoscopy N/A 12/03/2012    Procedure: COLONOSCOPY;  Surgeon: Lafayette Dragon, MD;  Location: [facility];  Service: Endoscopy;  Laterality: N/A;   Family History  Problem Relation Age of Onset  . Heart disease Mother   . Heart disease Father   . Heart disease      family history  . Stomach cancer Paternal Grandmother   . Lung cancer Paternal Grandfather   . Colon cancer Neg Hx   . CVA      several aunts  . Heart attack Mother 47  . Heart attack Father 36  . Heart attack      several aunts and an uncle   Social History  Substance Use Topics  . Smoking status: Never Smoker   . Smokeless tobacco: Never Used  . Alcohol Use: No   OB History    No  data available     Review of Systems  Constitutional: Positive for chills and fatigue. Negative for fever and diaphoresis.  HENT: Positive for sore throat.        Dry mouth  Eyes: Positive for visual disturbance ( blurry vision x few weeks).  Respiratory: Negative for cough and shortness of breath.   Cardiovascular: Positive for leg swelling. Negative for chest pain and palpitations.  Gastrointestinal: Positive for abdominal pain ( lower abdominal pain, chronic). Negative for nausea, vomiting, diarrhea, constipation and blood in stool.  Endocrine: Positive for polydipsia and polyuria.  Genitourinary: Positive for dysuria ( off and on for last six months).       Urinary incontinence Brown mucous upon wiping    Musculoskeletal: Negative for neck pain.  Neurological: Positive for dizziness, weakness ( generalized), numbness (chronic managed with medication) and headaches ( mild x few days). Negative for syncope.      Allergies  Cymbalta; Statins; Sulfa antibiotics; Levemir; Morphine; and Zinc  Home Medications   Prior to Admission medications   Medication Sig Start Date End Date Taking? Authorizing Provider  aspirin EC 81 MG tablet Take 1 tablet (81 mg total) by mouth daily. 07/08/14  Yes Rowe Clack, MD  B Complex-C (B-COMPLEX WITH VITAMIN C) tablet Take 1 tablet by mouth daily.   Yes Historical Provider, MD  carvedilol (COREG) 25 MG tablet TAKE 1 TABLET BY MOUTH TWICE DAILY WITH FOOD 11/06/14  Yes Jolaine Artist, MD  Cholecalciferol (VITAMIN D3) 2000 UNITS capsule Take 2,000 Units by mouth 4 (four) times daily - after meals and at bedtime.    Yes Historical Provider, MD  citalopram (CELEXA) 20 MG tablet Take 1 tablet (20 mg total) by mouth daily. 05/04/15  Yes [name], MD  Coenzyme Q10 200 MG capsule Take 200 mg by mouth daily.   Yes Historical Provider, MD  diclofenac sodium (VOLTAREN) 1 % GEL Apply 2 g topically daily as needed (knee pain). 10/26/14  Yes Florencia Reasons, MD  DULoxetine (CYMBALTA) 60 MG capsule Take 60 mg by mouth daily.   Yes Historical Provider, MD  ezetimibe (ZETIA) 10 MG tablet Take 1 tablet (10 mg total) by mouth daily. 07/08/14  Yes Rowe Clack, MD  fenofibrate 160 MG tablet TAKE 1 TABLET (160 MG TOTAL) BY MOUTH DAILY. MUST ESTABLISH WITH NEW PCP FOR ADDITIONAL REFILLS. 02/25/15  Yes [name], MD  gabapentin (NEURONTIN) 300 MG capsule TAKE 2 CAPSULES (600 MG TOTAL) BY MOUTH 4 (FOUR) TIMES DAILY. 05/28/15  Yes Keelan Friar, PA-C  hydrALAZINE (APRESOLINE) 25 MG tablet TAKE 1 TABLET (25 MG TOTAL) BY MOUTH 3 (THREE) TIMES DAILY. 10/28/14  Yes Jolaine Artist, MD  hydrOXYzine (ATARAX/VISTARIL) 10 MG tablet Take 10 mg by mouth 3 (three) times  daily as needed for anxiety. Reported on 02/10/2015   Yes Historical Provider, MD  insulin NPH Human (NOVOLIN N) 100 UNIT/ML injection Inject 1.8 mLs (180 Units total) into the skin every morning. 06/09/15  Yes Renato Shin, MD  ketoconazole (NIZORAL) 2 % cream Apply 1 application topically daily. Reported on 03/19/2015   Yes Historical Provider, MD  lactobacillus (FLORANEX/LACTINEX) PACK Take 1 packet (1 g total) by mouth 3 (three) times daily with meals. 10/26/14  Yes Florencia Reasons, MD  Magnesium 500 MG TABS Take 500 mg by mouth daily.   Yes Historical Provider, MD  metolazone (ZAROXOLYN) 5 MG tablet TAKE 5mg  (1 tablet) Saturday.  Can take an additional as needed for wt gain  3lbs overnight or 5 lbs within one week. 05/09/15  Yes Jaleya Friar, PA-C  nystatin-triamcinolone ointment [facility]) Apply 1 application topically 2 (two) times daily. 03/31/15  Yes [name], MD  omega-3 acid ethyl esters (LOVAZA) 1 g capsule Take 1 capsule (1 g total) by mouth 2 (two) times daily. 04/29/15  Yes Birdie Friar, PA-C  potassium chloride 20 MEQ TBCR Take 40 mEq by mouth 2 (two) times daily. With an additional 40 on Sat 05/15/15  Yes Jazzman Friar, PA-C  rOPINIRole (REQUIP) 2 MG tablet Take 1 tablet (2 mg total) by mouth 3 (three) times daily as needed. 05/04/15  Yes [name], MD  spironolactone (ALDACTONE) 25 MG tablet TAKE 1 TABLET BY MOUTH DAILY 04/29/15  Yes Jolaine Artist, MD  torsemide (DEMADEX) 100 MG tablet TAKE 1 TABLET (100 MG TOTAL) BY MOUTH 2 (TWO) TIMES DAILY. 05/04/15  Yes Larey Dresser, MD  triamcinolone cream (KENALOG) 0.1 % Apply 1 application topically 2 (two) times daily as needed (rash). Reported on 05/04/2015   Yes Historical Provider, MD  vitamin B-12 (CYANOCOBALAMIN) 500 MCG tablet Take 500 mcg by mouth daily. Reported on 03/19/2015   Yes Historical Provider, MD   BP 125/39 mmHg  Pulse 66  Temp(Src) 97.3 F (36.3 C) (Oral)  Resp 18  Ht 4\' 10"  (1.473 m)   Wt 118.389 kg  BMI 54.56 kg/m2  SpO2 92% Physical Exam  Constitutional: She appears well-developed and well-nourished. No distress.  HENT:  Head: Normocephalic and atraumatic.  Mouth/Throat: Mucous membranes are dry. No oropharyngeal exudate.  Eyes: Conjunctivae and EOM are normal. Pupils are equal, round, and reactive to light. Right eye exhibits no discharge. Left eye exhibits no discharge. No scleral icterus.  Neck: Normal range of motion. Neck supple.  Cardiovascular: Normal rate and regular rhythm.   Murmur ( systolic ejection murmur) heard. Intact radial pulses, good capillary refill in lower extremities b/l  1-2+ pitting edema in extremities b/l  Pulmonary/Chest: Effort normal and breath sounds normal. No respiratory distress.  Abdominal: Soft. Bowel sounds are normal. There is no tenderness. There is no rebound and no guarding.  Genitourinary: Vagina normal. Pelvic exam was performed with patient prone.  Difficult exam due to body habitus. Unable to visualize cervix. Gray-white discharge noted in vagina.   Musculoskeletal: Normal range of motion.  Lymphadenopathy:    She has no cervical adenopathy.  Neurological: She is alert. No cranial nerve deficit. Coordination normal.  Alert to person and place Strength and sensation grossly intact in extremities  Skin: Skin is warm and dry. She is not diaphoretic.  Psychiatric: She has a normal mood and affect.    ED Course  Procedures (including critical care time) Labs Review Labs Reviewed  BASIC METABOLIC PANEL - Abnormal; Notable for the following:    Sodium 134 (*)    Chloride 85 (*)    CO2 33 (*)    Glucose, Bld 549 (*)    BUN 57 (*)    Creatinine, Ser 1.74 (*)    GFR calc non Af Amer 29 (*)    GFR calc Af Amer 33 (*)    Anion gap 16 (*)    All other components within normal limits  URINALYSIS, ROUTINE W REFLEX MICROSCOPIC (NOT AT [facility]) - Abnormal; Notable for the following:    APPearance HAZY (*)    Glucose, UA  >1000 (*)    Leukocytes, UA MODERATE (*)    All other components within normal limits  URINE MICROSCOPIC-ADD ON - Abnormal; Notable for the following:    Squamous Epithelial / LPF 0-5 (*)    Bacteria, UA RARE (*)    All other components within normal limits  CBG MONITORING, ED - Abnormal; Notable for the following:    Glucose-Capillary 513 (*)    All other components within normal limits  CBG MONITORING, ED - Abnormal; Notable for the following:    Glucose-Capillary 455 (*)    All other components within normal limits  I-STAT ARTERIAL BLOOD GAS, ED - Abnormal; Notable for the following:    pCO2 arterial 56.0 (*)    pO2, Arterial 64.0 (*)    Bicarbonate 36.9 (*)    Acid-Base Excess 10.0 (*)    All other components within normal limits  CBG MONITORING, ED - Abnormal; Notable for the following:    Glucose-Capillary 321 (*)    All other components within normal limits  CBG MONITORING, ED - Abnormal; Notable for the following:    Glucose-Capillary 238 (*)    All other components within normal limits  WET PREP, GENITAL  URINE CULTURE  CBC  GC/CHLAMYDIA PROBE AMP ([place]) NOT AT [facility]    Imaging Review No results found.   EKG Interpretation None      MDM   Final diagnoses:  Hyperglycemia  Obesity    Patient is lethargic, but responsive and cooperative to questions and tasks. Glucose significantly elevated at 549, concern for DKA given anion gap and hypochloremia; however, unclear due to elevated by bicarbonate. Obtained ABG, pH within normal limits, not likely to be DKA. UA unclear - moderate leukocytes; however, with microscopy question specimen contamination. Will send urine for culture. Significant glucosuria, similar to previous lab findings approximately 1 month ago. Serum creatinine elevated; however, chronically elevated and stable.   With dry mucous membranes and hyperglycemia, provided 2L fluids. Positive treatment response to 10 units insulin with decrease in  CBG to 321. Continued improvement in CBG to 238 following additional 7 units of insulin. Patient ambulated well with use of walker.   Cause of hyperglycemia may be infection related (await urine culture), medication concern (recent changes to insulin dosing), or possible dietary indiscretion (endorses consumption of candy). Stressed importance to follow up with PCP Monday regarding ED visit and hyperglycemia. Provided return precautions. Patient voiced understanding and is agreeable.   Roxanna Mew, PA-C 06/12/15 0201  Noemi Chapel, MD 06/14/15 2055

## 2015-06-11 NOTE — ED Provider Notes (Signed)
The patient is a 70 year old female, she presents with a complaint of dry mouth, dehydration, she is a known diabetic who is at increased blood sugars at home, on exam the patient has soft nontender abdomen, clear lung sounds, systolic murmur is present, bilateral lower extremities are obese, symmetrical, she has erythema of the bilateral feet but there is no warmth or induration of the skin and the husband states that her feet have looked like that for over 2 years. The patient denies fevers, she likely has hypoglycemic dehydration versus DKA, she will need fluids, likely she will need insulin, check electrolytes prior to starting insulin.  There is a slight anion gap however the ABG shows no acidosis, the CO2 level is normal, her abdominal exam is benign, vaginal exam of which I assisted, chaperone present, no signs of any vaginal discharge bleeding or foreign bodies. The patient was given insulin, fluids, significant improvement in the blood sugar to a stable range.  Medical screening examination/treatment/procedure(s) were conducted as a shared visit with non-physician practitioner(s) and myself.  I personally evaluated the patient during the encounter.  Clinical Impression:   Final diagnoses:  Hyperglycemia  Obesity         Noemi Chapel, MD 06/14/15 2055

## 2015-06-11 NOTE — ED Notes (Signed)
Pt ambulated in hall with a walker without difficulty

## 2015-06-11 NOTE — Telephone Encounter (Signed)
Patient Name: Melissa Myers  DOB: 10-Dec-1945    Initial Comment Caller states [name] with [facility] to pt's house earlier and she has been having trouble staying awake. She keeps having to yell at her. Dr. increased her dose of insulin yesterday from 150 units to 180 units. BG 566 before insulin this a.m @ 7:30. Took Humalin-N 180 units after that. Recheck BG again just now and over 600. Also having bloody d/c from vaginal area.   Nurse Assessment  Nurse: Mallie Mussel, RN, Alveta Heimlich Date/Time Eilene Ghazi Time): 06/11/2015 2:16:52 PM  Confirm and document reason for call. If symptomatic, describe symptoms. You must click the next button to save text entered. ---Caller states that she took the patient's glucose reading and it was over 600. She falls asleep easily. She is not slurring her words. Denies vomiting and rapid breathing.  Has the patient traveled out of the country within the last 30 days? ---Not Applicable  Does the patient have any new or worsening symptoms? ---Yes  Will a triage be completed? ---Yes  Related visit to physician within the last 2 weeks? ---No  Does the PT have any chronic conditions? (i.e. diabetes, asthma, etc.) ---Yes  List chronic conditions. ---diabetes, CHF, Renal Complications, HTN  Is this a behavioral health or substance abuse call? ---No     Guidelines    Guideline Title Affirmed Question Affirmed Notes  Diabetes - High Blood Sugar Blood glucose > 500 mg/dl (27.5 mmol/l)    Final Disposition User   Go to ED Now (or PCP triage) Mallie Mussel, RN, Alveta Heimlich    Comments  I called the backline and spoke with [facility]. She spoke with Dr. Sharlet Salina who wants her to go to the ER. Caller notified. Verbalized understanding.   Referrals  [facility] - ED   Disagree/Comply: Comply

## 2015-06-11 NOTE — ED Notes (Signed)
CBG 513. States she took her normal insulin this morning.

## 2015-06-11 NOTE — Discharge Instructions (Signed)
It is imperative that you follow up with your PCP on Monday for ED visit follow up. Return to the ED if symptoms worsen, develop changes in mental status, shortness of breath, fever, or chest pain. Be sure to follow a diabetic diet, check sugars first thing in the morning and before meals and take a log with you to your PCP. Take home medications as prescribed. Will call if abnormal urine culture.   Hyperglycemia High blood sugar (hyperglycemia) means that the level of sugar in your blood is higher than it should be. Signs of high blood sugar include:  Feeling thirsty.  Frequent peeing (urinating).  Feeling tired or sleepy.  Dry mouth.  Vision changes.  Feeling weak.  Feeling hungry but losing weight.  Numbness and tingling in your hands or feet.  Headache. When you ignore these signs, your blood sugar may keep going up. These problems may get worse, and other problems may begin. HOME CARE  Check your blood sugars as told by your doctor. Write down the numbers with the date and time.  Take the right amount of insulin or diabetes pills at the right time. Write down the dose with date and time.  Refill your insulin or diabetes pills before running out.  Watch what you eat. Follow your meal plan.  Drink liquids without sugar, such as water. Check with your doctor if you have kidney or heart disease.  Follow your doctor's orders for exercise. Exercise at the same time of day.  Keep your doctor's appointments. GET HELP RIGHT AWAY IF:   You have trouble thinking or are confused.  You have fast breathing with fruity smelling breath.  You pass out (faint).  You have 2 to 3 days of high blood sugars and you do not know why.  You have chest pain.  You are feeling sick to your stomach (nauseous) or throwing up (vomiting).  You have sudden vision changes. MAKE SURE YOU:   Understand these instructions.  Will watch your condition.  Will get help right away if you are not  doing well or get worse.   This information is not intended to replace advice given to you by your health care provider. Make sure you discuss any questions you have with your health care provider.   Document Released: 12/05/2008 Document Revised: 02/28/2014 Document Reviewed: 10/14/2014 Elsevier Interactive Patient Education [employer].

## 2015-06-11 NOTE — Progress Notes (Signed)
Critical value abg results given to MD.

## 2015-06-11 NOTE — ED Notes (Signed)
Pt arrives to ED from home stating that her home health nurse checked her CBG and it was over 600. States she has been really thirst and urinating a lot.

## 2015-06-12 LAB — GC/CHLAMYDIA PROBE AMP (~~LOC~~) NOT AT ARMC
CHLAMYDIA, DNA PROBE: NEGATIVE
NEISSERIA GONORRHEA: NEGATIVE

## 2015-06-13 LAB — URINE CULTURE: Culture: >=100000 — AB

## 2015-06-14 ENCOUNTER — Telehealth: Payer: Self-pay

## 2015-06-14 NOTE — Progress Notes (Signed)
ED Antimicrobial Stewardship Positive Culture Follow Up   Melissa Myers is an 70 y.o. female who presented to [facility] on 06/11/2015 with a chief complaint of  Chief Complaint  Patient presents with  . Hyperglycemia    Recent Results (from the past 720 hour(s))  CULTURE, URINE COMPREHENSIVE     Status: None   Collection Time: 05/15/15  3:26 PM  Result Value Ref Range Status   Culture KLEBSIELLA OXYTOCA  Final   Colony Count >=100,000 COLONIES/ML  Final   Organism ID, Bacteria KLEBSIELLA OXYTOCA  Final      Susceptibility   Klebsiella oxytoca -  (no method available)    AMPICILLIN >=32 Resistant     AMOX/CLAVULANIC 8 Sensitive     AMPICILLIN/SULBACTAM 16 Intermediate     PIP/TAZO <=4 Sensitive     IMIPENEM <=0.25 Sensitive     CEFAZOLIN 8 Resistant     CEFTRIAXONE <=1 Sensitive     CEFTAZIDIME <=1 Sensitive     CEFEPIME <=1 Sensitive     GENTAMICIN <=1 Sensitive     TOBRAMYCIN <=1 Sensitive     CIPROFLOXACIN <=0.25 Sensitive     LEVOFLOXACIN <=0.12 Sensitive     NITROFURANTOIN 32 Sensitive     TRIMETH/SULFA* <=20 Sensitive      * NR=NOT REPORTABLE,SEE COMMENTORAL therapy:A cefazolin MIC of <32 predicts susceptibility to the oral agents cefaclor,cefdinir,cefpodoxime,cefprozil,cefuroxime,cephalexin,and loracarbef when used for therapy of uncomplicated UTIs due to E.coli,K.pneumomiae,and P.mirabilis. PARENTERAL therapy: A cefazolinMIC of >8 indicates resistance to parenteralcefazolin. An alternate test method must beperformed to confirm susceptibility to parenteralcefazolin.  Urine culture     Status: Abnormal   Collection Time: 06/11/15  4:56 PM  Result Value Ref Range Status   Specimen Description URINE, RANDOM  Final   Special Requests NONE  Final   Culture >=100,000 COLONIES/mL ESCHERICHIA COLI (A)  Final   Report Status 06/13/2015 FINAL  Final   Organism ID, Bacteria ESCHERICHIA COLI (A)  Final      Susceptibility   Escherichia coli - MIC*    AMPICILLIN <=2  SENSITIVE Sensitive     CEFAZOLIN <=4 SENSITIVE Sensitive     CEFTRIAXONE <=1 SENSITIVE Sensitive     CIPROFLOXACIN <=0.25 SENSITIVE Sensitive     GENTAMICIN <=1 SENSITIVE Sensitive     IMIPENEM <=0.25 SENSITIVE Sensitive     NITROFURANTOIN <=16 SENSITIVE Sensitive     TRIMETH/SULFA <=20 SENSITIVE Sensitive     AMPICILLIN/SULBACTAM <=2 SENSITIVE Sensitive     PIP/TAZO <=4 SENSITIVE Sensitive     * >=100,000 COLONIES/mL ESCHERICHIA COLI  Wet prep, genital     Status: None   Collection Time: 06/11/15  7:07 PM  Result Value Ref Range Status   Yeast Wet Prep HPF POC NONE SEEN NONE SEEN Final   Trich, Wet Prep NONE SEEN NONE SEEN Final   Clue Cells Wet Prep HPF POC NONE SEEN NONE SEEN Final   WBC, Wet Prep HPF POC NONE SEEN NONE SEEN Final   Sperm NONE SEEN  Final    UA negative and no urinary symptoms. Call patient - if she is feeling better and not having fevers then no treatment.  If she is still having trouble controlling her sugar, then treat with Keflex 500 mg PO BID x 5 days.  ED Provider: Elnora Morrison, MD  Danyael Alipio L. Nicole Kindred, PharmD PGY2 Infectious Diseases Pharmacy Resident Pager: [phone] 06/14/2015 8:36 AM

## 2015-06-14 NOTE — Telephone Encounter (Signed)
Pt with high Glucose levels in ED, and may need Abx treament if levels are still high. No answer to calls or voice mail. Letter sent to home address on file

## 2015-06-17 ENCOUNTER — Ambulatory Visit (INDEPENDENT_AMBULATORY_CARE_PROVIDER_SITE_OTHER): Payer: PPO | Admitting: Internal Medicine

## 2015-06-17 ENCOUNTER — Other Ambulatory Visit (INDEPENDENT_AMBULATORY_CARE_PROVIDER_SITE_OTHER): Payer: PPO

## 2015-06-17 ENCOUNTER — Encounter: Payer: Self-pay | Admitting: Internal Medicine

## 2015-06-17 VITALS — BP 140/58 | HR 74 | Temp 98.1°F | Resp 16 | Ht <= 58 in

## 2015-06-17 DIAGNOSIS — Z794 Long term (current) use of insulin: Secondary | ICD-10-CM | POA: Diagnosis not present

## 2015-06-17 DIAGNOSIS — E662 Morbid (severe) obesity with alveolar hypoventilation: Secondary | ICD-10-CM | POA: Diagnosis not present

## 2015-06-17 DIAGNOSIS — Z87440 Personal history of urinary (tract) infections: Secondary | ICD-10-CM | POA: Diagnosis not present

## 2015-06-17 DIAGNOSIS — I83019 Varicose veins of right lower extremity with ulcer of unspecified site: Secondary | ICD-10-CM

## 2015-06-17 DIAGNOSIS — E1122 Type 2 diabetes mellitus with diabetic chronic kidney disease: Secondary | ICD-10-CM

## 2015-06-17 DIAGNOSIS — E785 Hyperlipidemia, unspecified: Secondary | ICD-10-CM | POA: Diagnosis not present

## 2015-06-17 DIAGNOSIS — E559 Vitamin D deficiency, unspecified: Secondary | ICD-10-CM | POA: Diagnosis not present

## 2015-06-17 DIAGNOSIS — Z9981 Dependence on supplemental oxygen: Secondary | ICD-10-CM | POA: Diagnosis not present

## 2015-06-17 DIAGNOSIS — L03115 Cellulitis of right lower limb: Secondary | ICD-10-CM | POA: Diagnosis not present

## 2015-06-17 DIAGNOSIS — N184 Chronic kidney disease, stage 4 (severe): Secondary | ICD-10-CM | POA: Diagnosis not present

## 2015-06-17 DIAGNOSIS — R300 Dysuria: Secondary | ICD-10-CM | POA: Diagnosis not present

## 2015-06-17 DIAGNOSIS — I83029 Varicose veins of left lower extremity with ulcer of unspecified site: Secondary | ICD-10-CM

## 2015-06-17 DIAGNOSIS — G2581 Restless legs syndrome: Secondary | ICD-10-CM | POA: Diagnosis not present

## 2015-06-17 DIAGNOSIS — I10 Essential (primary) hypertension: Secondary | ICD-10-CM

## 2015-06-17 DIAGNOSIS — D539 Nutritional anemia, unspecified: Secondary | ICD-10-CM

## 2015-06-17 DIAGNOSIS — B370 Candidal stomatitis: Secondary | ICD-10-CM

## 2015-06-17 DIAGNOSIS — L97919 Non-pressure chronic ulcer of unspecified part of right lower leg with unspecified severity: Secondary | ICD-10-CM

## 2015-06-17 DIAGNOSIS — L97929 Non-pressure chronic ulcer of unspecified part of left lower leg with unspecified severity: Secondary | ICD-10-CM

## 2015-06-17 DIAGNOSIS — L03116 Cellulitis of left lower limb: Secondary | ICD-10-CM | POA: Diagnosis not present

## 2015-06-17 DIAGNOSIS — I872 Venous insufficiency (chronic) (peripheral): Secondary | ICD-10-CM | POA: Diagnosis not present

## 2015-06-17 DIAGNOSIS — N183 Chronic kidney disease, stage 3 (moderate): Secondary | ICD-10-CM | POA: Diagnosis not present

## 2015-06-17 DIAGNOSIS — M170 Bilateral primary osteoarthritis of knee: Secondary | ICD-10-CM | POA: Diagnosis not present

## 2015-06-17 DIAGNOSIS — I5042 Chronic combined systolic (congestive) and diastolic (congestive) heart failure: Secondary | ICD-10-CM | POA: Diagnosis not present

## 2015-06-17 DIAGNOSIS — G4733 Obstructive sleep apnea (adult) (pediatric): Secondary | ICD-10-CM | POA: Diagnosis not present

## 2015-06-17 DIAGNOSIS — Z5181 Encounter for therapeutic drug level monitoring: Secondary | ICD-10-CM | POA: Diagnosis not present

## 2015-06-17 DIAGNOSIS — I130 Hypertensive heart and chronic kidney disease with heart failure and stage 1 through stage 4 chronic kidney disease, or unspecified chronic kidney disease: Secondary | ICD-10-CM | POA: Diagnosis not present

## 2015-06-17 DIAGNOSIS — E1142 Type 2 diabetes mellitus with diabetic polyneuropathy: Secondary | ICD-10-CM | POA: Diagnosis not present

## 2015-06-17 DIAGNOSIS — M47816 Spondylosis without myelopathy or radiculopathy, lumbar region: Secondary | ICD-10-CM | POA: Diagnosis not present

## 2015-06-17 DIAGNOSIS — Z7982 Long term (current) use of aspirin: Secondary | ICD-10-CM | POA: Diagnosis not present

## 2015-06-17 DIAGNOSIS — Z7901 Long term (current) use of anticoagulants: Secondary | ICD-10-CM | POA: Diagnosis not present

## 2015-06-17 LAB — BASIC METABOLIC PANEL
BUN: 61 mg/dL — ABNORMAL HIGH (ref 6–23)
CHLORIDE: 87 meq/L — AB (ref 96–112)
CO2: 36 mEq/L — ABNORMAL HIGH (ref 19–32)
Calcium: 9.5 mg/dL (ref 8.4–10.5)
Creatinine, Ser: 2.14 mg/dL — ABNORMAL HIGH (ref 0.40–1.20)
GFR: 24.22 mL/min — ABNORMAL LOW (ref >60.00)
Glucose, Bld: 340 mg/dL — ABNORMAL HIGH (ref 70–99)
POTASSIUM: 3.7 meq/L (ref 3.5–5.1)
Sodium: 134 mEq/L — ABNORMAL LOW (ref 135–145)

## 2015-06-17 LAB — POCT URINALYSIS DIPSTICK
Bilirubin, UA: NEGATIVE
Blood, UA: NEGATIVE
KETONES UA: NEGATIVE
Nitrite, UA: NEGATIVE
Protein, UA: NEGATIVE
SPEC GRAV UA: 1.015
UROBILINOGEN UA: 0.2
pH, UA: 6.5

## 2015-06-17 LAB — IBC PANEL
Iron: 50 ug/dL (ref 42–145)
SATURATION RATIOS: 7.9 % — AB (ref 20.0–50.0)
TRANSFERRIN: 451 mg/dL — AB (ref 212.0–360.0)

## 2015-06-17 LAB — VITAMIN D 25 HYDROXY (VIT D DEFICIENCY, FRACTURES): VITD: 22.82 ng/mL — ABNORMAL LOW (ref 30.00–100.00)

## 2015-06-17 LAB — VITAMIN B12: VITAMIN B 12: 446 pg/mL (ref 211–911)

## 2015-06-17 LAB — POCT GLUCOSE (DEVICE FOR HOME USE): POC GLUCOSE: 338 mg/dL — AB (ref 70–99)

## 2015-06-17 LAB — FOLATE: FOLATE: 19.9 ng/mL (ref >5.9)

## 2015-06-17 LAB — FERRITIN: Ferritin: 66.1 ng/mL (ref 10.0–291.0)

## 2015-06-17 MED ORDER — VITAMIN D3 50 MCG (2000 UT) PO CAPS: 2000.0000 [IU] | ORAL_CAPSULE | Freq: Every day | ORAL | Status: AC

## 2015-06-17 MED ORDER — CLOTRIMAZOLE 10 MG MT TROC: 10.0000 mg | Freq: Every day | OROMUCOSAL | Status: DC

## 2015-06-17 MED ORDER — VASCULERA PO TABS: 1.0000 | ORAL_TABLET | Freq: Every day | ORAL | Status: DC

## 2015-06-17 NOTE — Patient Instructions (Signed)

## 2015-06-17 NOTE — Progress Notes (Signed)
Subjective:  Patient ID: Melissa Myers, female    DOB: 04-03-1945  Age: 70 y.o. MRN: [id number]  CC: Diabetes; Anemia; and Hypertension   HPI MALANII PRUNEAU presents for follow-up after recent visit to the ER for hyperglycemia. The ER doctor was concerned about her renal function and possible development of acidemia. She tells me that she is having trouble getting her blood sugar under control. She has been told by her endocrinologist that she can't take any oral medications. She is on a twice a day dose of NPH. She complains that her tongue is red and feels irritated. She is concerned she might have thrush. She also complains of red, stinging, itchy areas on both lower extremities anteriorly. She complains of polyuria, polydipsia, polyphagia, and urinary incontinence.  Outpatient Prescriptions Prior to Visit  Medication Sig Dispense Refill  . aspirin EC 81 MG tablet Take 1 tablet (81 mg total) by mouth daily.    . B Complex-C (B-COMPLEX WITH VITAMIN C) tablet Take 1 tablet by mouth daily.    . carvedilol (COREG) 25 MG tablet TAKE 1 TABLET BY MOUTH TWICE DAILY WITH FOOD 60 tablet 5  . citalopram (CELEXA) 20 MG tablet Take 1 tablet (20 mg total) by mouth daily. 30 tablet 3  . Coenzyme Q10 200 MG capsule Take 200 mg by mouth daily.    . diclofenac sodium (VOLTAREN) 1 % GEL Apply 2 g topically daily as needed (knee pain). 10 g 3  . DULoxetine (CYMBALTA) 60 MG capsule Take 60 mg by mouth daily.    [name] ezetimibe (ZETIA) 10 MG tablet Take 1 tablet (10 mg total) by mouth daily. 90 tablet 3  . fenofibrate 160 MG tablet TAKE 1 TABLET (160 MG TOTAL) BY MOUTH DAILY. MUST ESTABLISH WITH NEW PCP FOR ADDITIONAL REFILLS. 90 tablet 3  . gabapentin (NEURONTIN) 300 MG capsule TAKE 2 CAPSULES (600 MG TOTAL) BY MOUTH 4 (FOUR) TIMES DAILY. 240 capsule 0  . hydrALAZINE (APRESOLINE) 25 MG tablet TAKE 1 TABLET (25 MG TOTAL) BY MOUTH 3 (THREE) TIMES DAILY. 90 tablet 2  . hydrOXYzine (ATARAX/VISTARIL) 10 MG  tablet Take 10 mg by mouth 3 (three) times daily as needed for anxiety. Reported on 02/10/2015    . ketoconazole (NIZORAL) 2 % cream Apply 1 application topically daily. Reported on 03/19/2015    . lactobacillus (FLORANEX/LACTINEX) PACK Take 1 packet (1 g total) by mouth 3 (three) times daily with meals. 30 packet 0  . Magnesium 500 MG TABS Take 500 mg by mouth daily.    . metolazone (ZAROXOLYN) 5 MG tablet TAKE 5mg  (1 tablet) Saturday.  Can take an additional as needed for wt gain 3lbs overnight or 5 lbs within one week. 15 tablet 3  . nystatin-triamcinolone ointment (MYCOLOG) Apply 1 application topically 2 (two) times daily. 60 g 2  . omega-3 acid ethyl esters (LOVAZA) 1 g capsule Take 1 capsule (1 g total) by mouth 2 (two) times daily. 180 capsule 3  . potassium chloride 20 MEQ TBCR Take 40 mEq by mouth 2 (two) times daily. With an additional 40 on Sat 145 tablet 6  . rOPINIRole (REQUIP) 2 MG tablet Take 1 tablet (2 mg total) by mouth 3 (three) times daily as needed. 270 tablet 3  . spironolactone (ALDACTONE) 25 MG tablet TAKE 1 TABLET BY MOUTH DAILY 30 tablet 2  . torsemide (DEMADEX) 100 MG tablet TAKE 1 TABLET (100 MG TOTAL) BY MOUTH 2 (TWO) TIMES DAILY. 60 tablet 5  . triamcinolone  cream (KENALOG) 0.1 % Apply 1 application topically 2 (two) times daily as needed (rash). Reported on 05/04/2015    . vitamin B-12 (CYANOCOBALAMIN) 500 MCG tablet Take 500 mcg by mouth daily. Reported on 03/19/2015    . Cholecalciferol (VITAMIN D3) 2000 UNITS capsule Take 2,000 Units by mouth 4 (four) times daily - after meals and at bedtime.     . insulin NPH Human (NOVOLIN N) 100 UNIT/ML injection Inject 1.8 mLs (180 Units total) into the skin every morning. (Patient not taking: Reported on 06/17/2015) 60 mL 11   No facility-administered medications prior to visit.    ROS Review of Systems  Constitutional: Negative.  Negative for fever, chills, diaphoresis, appetite change and fatigue.  HENT: Negative.  Negative  for facial swelling, sore throat and trouble swallowing.   Eyes: Negative.  Negative for visual disturbance.  Respiratory: Negative.  Negative for cough, choking, chest tightness, shortness of breath and stridor.   Cardiovascular: Negative.  Negative for chest pain, palpitations and leg swelling.  Gastrointestinal: Negative.  Negative for nausea, vomiting, abdominal pain, diarrhea and constipation.  Endocrine: Positive for polydipsia, polyphagia and polyuria.  Genitourinary: Positive for dysuria.  Musculoskeletal: Negative.  Negative for myalgias, back pain, joint swelling and arthralgias.  Skin: Positive for rash. Negative for color change, pallor and wound.  Allergic/Immunologic: Negative.   Neurological: Negative.  Negative for dizziness, tremors, weakness, light-headedness, numbness and headaches.  Hematological: Negative.  Negative for adenopathy. Does not bruise/bleed easily.  Psychiatric/Behavioral: Negative.     Objective:  BP 140/58 mmHg  Pulse 74  Temp(Src) 98.1 F (36.7 C) (Oral)  Resp 16  Ht 4\' 10"  (1.473 m)  Wt   SpO2 93%  BP Readings from Last 3 Encounters:  06/17/15 140/58  06/11/15 125/39  06/09/15 140/80    Wt Readings from Last 3 Encounters:  06/11/15 261 lb (118.389 kg)  05/18/15 253 lb (114.76 kg)  05/15/15 248 lb 4 oz (112.605 kg)    Physical Exam  Constitutional: She is oriented to person, place, and time.  Non-toxic appearance. She has a sickly appearance. She appears ill. No distress.  Morbidly obese female bound to wheelchair  HENT:  Mouth/Throat: No oral lesions. No trismus in the jaw. No uvula swelling. No oropharyngeal exudate, posterior oropharyngeal edema, posterior oropharyngeal erythema or tonsillar abscesses.  Her tongue is mildly erythematous, I do not see any oral exudate.  Eyes: Conjunctivae are normal. Right eye exhibits no discharge. Left eye exhibits no discharge. No scleral icterus.  Neck: Normal range of motion. Neck supple. No JVD  present. No tracheal deviation present. No thyromegaly present.  Cardiovascular: Normal rate, regular rhythm, S1 normal, S2 normal and intact distal pulses.  Exam reveals no gallop and no friction rub.   Murmur heard.  Systolic murmur is present with a grade of 1/6   No diastolic murmur is present  1/6 harsh holosystolic murmur heard everywhere  Pulmonary/Chest: Effort normal and breath sounds normal. No stridor. No respiratory distress. She has no wheezes. She has no rales. She exhibits no tenderness.  Abdominal: Soft. Bowel sounds are normal. She exhibits no distension and no mass. There is no tenderness. There is no rebound and no guarding.  Musculoskeletal: Normal range of motion. She exhibits no edema or tenderness.       Left lower leg: She exhibits deformity. She exhibits no tenderness, no bony tenderness, no swelling, no edema and no laceration.       Legs: Lymphadenopathy:    She has no  cervical adenopathy.  Neurological: She is oriented to person, place, and time.  Skin: Skin is warm and dry. No rash noted. She is not diaphoretic. No erythema. No pallor.    Lab Results  Component Value Date   WBC 6.8 06/11/2015   HGB 12.1 06/11/2015   HCT 37.9 06/11/2015   PLT 330 06/11/2015   GLUCOSE 340* 06/17/2015   CHOL 196 12/23/2014   TRIG 204* 12/23/2014   HDL 45 12/23/2014   LDLDIRECT 108.0 07/08/2014   LDLCALC 110* 12/23/2014   ALT 24 05/04/2015   AST 31 05/04/2015   NA 134* 06/17/2015   K 3.7 06/17/2015   CL 87* 06/17/2015   CREATININE 2.14* 06/17/2015   BUN 61* 06/17/2015   CO2 36* 06/17/2015   TSH 2.010 12/05/2013   INR 1.09 11/14/2014   HGBA1C 9.6* 04/14/2015   MICROALBUR <0.7 07/08/2014    No results found.  Assessment & Plan:   Linsay was seen today for diabetes, anemia and hypertension.  Diagnoses and all orders for this visit:  Essential hypertension- her blood pressure is well-controlled, electrolytes and renal function are stable. -     Basic metabolic  panel; Future  Type 2 diabetes mellitus with stage 4 chronic kidney disease, with long-term current use of insulin ([place])- her CO2 is normal today so I do not think she is developing acidemia, her blood sugars are poorly controlled so Avastin to see her endocrinologist as soon as possible. -     POCT Glucose (Device for Home Use) -     Basic metabolic panel; Future -     Ambulatory referral to Endocrinology  Venous stasis ulcers of both lower extremities -     Dietary Management Product (VASCULERA) TABS; Take 1 capsule by mouth daily.  Deficiency anemia- her hemoglobin and hematocrit have improved, all of her vitamin levels are normal. -     Folate; Future -     Vitamin B12; Future -     IBC panel; Future -     Ferritin; Future  Vitamin D deficiency- I will treat this with oral vitamin D supplementation. -     VITAMIN D 25 Hydroxy (Vit-D Deficiency, Fractures); Future -     Cholecalciferol (VITAMIN D3) 2000 units capsule; Take 1 capsule (2,000 Units total) by mouth daily.  Dysuria- will check her urinalysis and urine culture to screen for urinary tract infection. -     POCT urinalysis dipstick -     Urine Culture; Future  Oral thrush- will treat the thrush with Mycelex troches -     clotrimazole (MYCELEX) 10 MG troche; Take 1 tablet (10 mg total) by mouth 5 (five) times daily.   I have changed Ms. Toothman's Vitamin D3. I am also having her start on VASCULERA and clotrimazole. Additionally, I am having her maintain her aspirin EC, ezetimibe, B-complex with vitamin C, hydrOXYzine, Magnesium, hydrALAZINE, diclofenac sodium, lactobacillus, carvedilol, vitamin B-12, triamcinolone cream, ketoconazole, fenofibrate, Coenzyme Q10, nystatin-triamcinolone ointment, spironolactone, DULoxetine, omega-3 acid ethyl esters, metolazone, torsemide, rOPINIRole, citalopram, Potassium Chloride ER, gabapentin, and insulin NPH Human.  Meds ordered this encounter  Medications  . Dietary Management Product  (VASCULERA) TABS    Sig: Take 1 capsule by mouth daily.    Dispense:  30 tablet    Refill:  11  . clotrimazole (MYCELEX) 10 MG troche    Sig: Take 1 tablet (10 mg total) by mouth 5 (five) times daily.    Dispense:  140 tablet    Refill:  2  .  Cholecalciferol (VITAMIN D3) 2000 units capsule    Sig: Take 1 capsule (2,000 Units total) by mouth daily.    Dispense:  90 capsule    Refill:  3     Follow-up: Return if symptoms worsen or fail to improve.  Scarlette Calico, MD

## 2015-06-17 NOTE — Progress Notes (Signed)
Pre visit review using our clinic review tool, if applicable. No additional management support is needed unless otherwise documented below in the visit note. 

## 2015-06-18 ENCOUNTER — Other Ambulatory Visit: Payer: Self-pay

## 2015-06-18 DIAGNOSIS — G4733 Obstructive sleep apnea (adult) (pediatric): Secondary | ICD-10-CM | POA: Diagnosis not present

## 2015-06-18 DIAGNOSIS — J9690 Respiratory failure, unspecified, unspecified whether with hypoxia or hypercapnia: Secondary | ICD-10-CM | POA: Diagnosis not present

## 2015-06-18 DIAGNOSIS — R0602 Shortness of breath: Secondary | ICD-10-CM | POA: Diagnosis not present

## 2015-06-18 DIAGNOSIS — I509 Heart failure, unspecified: Secondary | ICD-10-CM | POA: Diagnosis not present

## 2015-06-18 NOTE — Patient Outreach (Signed)
[place] [facility]) Care Management  06/18/2015  Melissa Myers 03-31-1945 [id number]  Care Coordination: RNCM spoke with [name], nurse with [facility] who confirmed that member is active with their program.  Plan: follow up with member regarding any additional care coordination needs.  Thea Silversmith, RN, MSN, [place] Coordinator Cell: [phone]

## 2015-06-18 NOTE — Patient Outreach (Signed)
[place] [facility]) Care Management  06/18/2015  LEIANN AUTIN [birth year] [id number]  RNCM called to follow up with member. Mr. Steckel reports member is sleeping stating that she stayed up all night cooking and states there are some things missing out of the refrigerator. Member has been sleeping for a couple hours. RNCM asked Mr. Konopa how her blood sugars have been. He is not aware of her blood sugar today, stating it was 107 on yesterday morning, and states he will have member to check her blood sugar when she gets up.  RNCM reinforced resources [facility] nurse, home health nurse, 24 hour nurse advice line.  Plan: RNCM will follow up with member within the next 1-2 weeks.  Thea Silversmith, RN, MSN, [place] Coordinator Cell: [phone]

## 2015-06-19 ENCOUNTER — Encounter: Payer: Self-pay | Admitting: Internal Medicine

## 2015-06-19 ENCOUNTER — Other Ambulatory Visit: Payer: Self-pay | Admitting: Internal Medicine

## 2015-06-19 ENCOUNTER — Other Ambulatory Visit: Payer: Self-pay | Admitting: Geriatric Medicine

## 2015-06-19 ENCOUNTER — Ambulatory Visit: Payer: PPO | Admitting: Gastroenterology

## 2015-06-19 ENCOUNTER — Telehealth: Payer: Self-pay

## 2015-06-19 DIAGNOSIS — N390 Urinary tract infection, site not specified: Principal | ICD-10-CM

## 2015-06-19 DIAGNOSIS — B9620 Unspecified Escherichia coli [E. coli] as the cause of diseases classified elsewhere: Secondary | ICD-10-CM | POA: Insufficient documentation

## 2015-06-19 LAB — URINE CULTURE: Colony Count: >=100000

## 2015-06-19 MED ORDER — NITROFURANTOIN MONOHYD MACRO 100 MG PO CAPS: 100.0000 mg | ORAL_CAPSULE | Freq: Two times a day (BID) | ORAL | Status: DC

## 2015-06-19 NOTE — Telephone Encounter (Signed)
Patient states that at [facility] his insurance will not pay for the RX Dr. Ronnald Ramp called. Could you please call it in to the pharmacy he uses. [facility] at [address]. Please advise or follow up.

## 2015-06-19 NOTE — Telephone Encounter (Signed)
Sent macrobid to [employer].

## 2015-06-22 ENCOUNTER — Telehealth: Payer: Self-pay

## 2015-06-22 NOTE — Telephone Encounter (Signed)
Expedited PA initiated via CoverMyMeds key [id number]

## 2015-06-23 ENCOUNTER — Telehealth: Payer: Self-pay | Admitting: Internal Medicine

## 2015-06-23 NOTE — Telephone Encounter (Signed)
PA APPROVED, pharmacy advised via fax

## 2015-06-23 NOTE — Telephone Encounter (Signed)
Called pt to inform her that there is a copy of the [facility] paperwork for her at the front desk & she can come pick it up whenever she is able too.

## 2015-06-25 DIAGNOSIS — Z7901 Long term (current) use of anticoagulants: Secondary | ICD-10-CM | POA: Diagnosis not present

## 2015-06-25 DIAGNOSIS — E785 Hyperlipidemia, unspecified: Secondary | ICD-10-CM | POA: Diagnosis not present

## 2015-06-25 DIAGNOSIS — E1122 Type 2 diabetes mellitus with diabetic chronic kidney disease: Secondary | ICD-10-CM | POA: Diagnosis not present

## 2015-06-25 DIAGNOSIS — M170 Bilateral primary osteoarthritis of knee: Secondary | ICD-10-CM | POA: Diagnosis not present

## 2015-06-25 DIAGNOSIS — I130 Hypertensive heart and chronic kidney disease with heart failure and stage 1 through stage 4 chronic kidney disease, or unspecified chronic kidney disease: Secondary | ICD-10-CM | POA: Diagnosis not present

## 2015-06-25 DIAGNOSIS — Z87440 Personal history of urinary (tract) infections: Secondary | ICD-10-CM | POA: Diagnosis not present

## 2015-06-25 DIAGNOSIS — E662 Morbid (severe) obesity with alveolar hypoventilation: Secondary | ICD-10-CM | POA: Diagnosis not present

## 2015-06-25 DIAGNOSIS — M47816 Spondylosis without myelopathy or radiculopathy, lumbar region: Secondary | ICD-10-CM | POA: Diagnosis not present

## 2015-06-25 DIAGNOSIS — G2581 Restless legs syndrome: Secondary | ICD-10-CM | POA: Diagnosis not present

## 2015-06-25 DIAGNOSIS — G4733 Obstructive sleep apnea (adult) (pediatric): Secondary | ICD-10-CM | POA: Diagnosis not present

## 2015-06-25 DIAGNOSIS — Z794 Long term (current) use of insulin: Secondary | ICD-10-CM | POA: Diagnosis not present

## 2015-06-25 DIAGNOSIS — Z5181 Encounter for therapeutic drug level monitoring: Secondary | ICD-10-CM | POA: Diagnosis not present

## 2015-06-25 DIAGNOSIS — L03115 Cellulitis of right lower limb: Secondary | ICD-10-CM | POA: Diagnosis not present

## 2015-06-25 DIAGNOSIS — E1142 Type 2 diabetes mellitus with diabetic polyneuropathy: Secondary | ICD-10-CM | POA: Diagnosis not present

## 2015-06-25 DIAGNOSIS — N183 Chronic kidney disease, stage 3 (moderate): Secondary | ICD-10-CM | POA: Diagnosis not present

## 2015-06-25 DIAGNOSIS — I5042 Chronic combined systolic (congestive) and diastolic (congestive) heart failure: Secondary | ICD-10-CM | POA: Diagnosis not present

## 2015-06-25 DIAGNOSIS — L03116 Cellulitis of left lower limb: Secondary | ICD-10-CM | POA: Diagnosis not present

## 2015-06-25 DIAGNOSIS — Z9981 Dependence on supplemental oxygen: Secondary | ICD-10-CM | POA: Diagnosis not present

## 2015-06-25 DIAGNOSIS — Z7982 Long term (current) use of aspirin: Secondary | ICD-10-CM | POA: Diagnosis not present

## 2015-07-01 ENCOUNTER — Encounter (HOSPITAL_COMMUNITY): Payer: Self-pay

## 2015-07-01 ENCOUNTER — Ambulatory Visit (HOSPITAL_COMMUNITY)
Admission: RE | Admit: 2015-07-01 | Discharge: 2015-07-01 | Disposition: A | Discharge status: Home / Self Care | Payer: PPO | Source: Ambulatory Visit | Attending: Cardiology | Admitting: Cardiology

## 2015-07-01 VITALS — BP 124/58 | HR 65 | Wt 254.5 lb

## 2015-07-01 DIAGNOSIS — Z794 Long term (current) use of insulin: Secondary | ICD-10-CM | POA: Diagnosis not present

## 2015-07-01 DIAGNOSIS — Z8249 Family history of ischemic heart disease and other diseases of the circulatory system: Secondary | ICD-10-CM | POA: Insufficient documentation

## 2015-07-01 DIAGNOSIS — I5032 Chronic diastolic (congestive) heart failure: Secondary | ICD-10-CM | POA: Diagnosis not present

## 2015-07-01 DIAGNOSIS — Z79899 Other long term (current) drug therapy: Secondary | ICD-10-CM | POA: Insufficient documentation

## 2015-07-01 DIAGNOSIS — G2581 Restless legs syndrome: Secondary | ICD-10-CM | POA: Diagnosis not present

## 2015-07-01 DIAGNOSIS — R011 Cardiac murmur, unspecified: Secondary | ICD-10-CM | POA: Insufficient documentation

## 2015-07-01 DIAGNOSIS — E662 Morbid (severe) obesity with alveolar hypoventilation: Secondary | ICD-10-CM | POA: Diagnosis not present

## 2015-07-01 DIAGNOSIS — E1142 Type 2 diabetes mellitus with diabetic polyneuropathy: Secondary | ICD-10-CM | POA: Diagnosis not present

## 2015-07-01 DIAGNOSIS — I130 Hypertensive heart and chronic kidney disease with heart failure and stage 1 through stage 4 chronic kidney disease, or unspecified chronic kidney disease: Secondary | ICD-10-CM | POA: Diagnosis not present

## 2015-07-01 DIAGNOSIS — N183 Chronic kidney disease, stage 3 (moderate): Secondary | ICD-10-CM | POA: Diagnosis not present

## 2015-07-01 DIAGNOSIS — Z7982 Long term (current) use of aspirin: Secondary | ICD-10-CM | POA: Insufficient documentation

## 2015-07-01 DIAGNOSIS — I739 Peripheral vascular disease, unspecified: Secondary | ICD-10-CM | POA: Diagnosis not present

## 2015-07-01 DIAGNOSIS — E1122 Type 2 diabetes mellitus with diabetic chronic kidney disease: Secondary | ICD-10-CM | POA: Diagnosis not present

## 2015-07-01 DIAGNOSIS — E785 Hyperlipidemia, unspecified: Secondary | ICD-10-CM | POA: Diagnosis not present

## 2015-07-01 LAB — BASIC METABOLIC PANEL
ANION GAP: 16 — AB (ref 5–15)
BUN: 36 mg/dL — ABNORMAL HIGH (ref 6–20)
CALCIUM: 9.1 mg/dL (ref 8.9–10.3)
CO2: 29 mmol/L (ref 22–32)
Chloride: 84 mmol/L — ABNORMAL LOW (ref 101–111)
Creatinine, Ser: 1.61 mg/dL — ABNORMAL HIGH (ref 0.44–1.00)
GFR, EST AFRICAN AMERICAN: 37 mL/min — AB (ref >60)
GFR, EST NON AFRICAN AMERICAN: 32 mL/min — AB (ref >60)
Glucose, Bld: 538 mg/dL — ABNORMAL HIGH (ref 65–99)
Potassium: 3.5 mmol/L (ref 3.5–5.1)
SODIUM: 129 mmol/L — AB (ref 135–145)

## 2015-07-01 LAB — LIPID PANEL
CHOLESTEROL: 250 mg/dL — AB (ref 0–200)
HDL: 36 mg/dL — ABNORMAL LOW (ref >40)
LDL Cholesterol: UNDETERMINED mg/dL (ref 0–99)
Total CHOL/HDL Ratio: 6.9 RATIO
Triglycerides: 500 mg/dL — ABNORMAL HIGH (ref <150)
VLDL: UNDETERMINED mg/dL (ref 0–40)

## 2015-07-01 LAB — BRAIN NATRIURETIC PEPTIDE: B NATRIURETIC PEPTIDE 5: 65.9 pg/mL (ref 0.0–100.0)

## 2015-07-01 NOTE — Patient Instructions (Signed)
Routine lab work today. Will notify you of abnormal results  Your provider requests you have Lower extremity arterial dopplers.  Follow up and echo with Dr.McLean in 2 months

## 2015-07-02 ENCOUNTER — Telehealth: Payer: Self-pay | Admitting: *Deleted

## 2015-07-02 DIAGNOSIS — N183 Chronic kidney disease, stage 3 (moderate): Secondary | ICD-10-CM | POA: Diagnosis not present

## 2015-07-02 DIAGNOSIS — L03115 Cellulitis of right lower limb: Secondary | ICD-10-CM | POA: Diagnosis not present

## 2015-07-02 DIAGNOSIS — L03116 Cellulitis of left lower limb: Secondary | ICD-10-CM | POA: Diagnosis not present

## 2015-07-02 DIAGNOSIS — M170 Bilateral primary osteoarthritis of knee: Secondary | ICD-10-CM | POA: Diagnosis not present

## 2015-07-02 DIAGNOSIS — M47816 Spondylosis without myelopathy or radiculopathy, lumbar region: Secondary | ICD-10-CM | POA: Diagnosis not present

## 2015-07-02 DIAGNOSIS — Z5181 Encounter for therapeutic drug level monitoring: Secondary | ICD-10-CM | POA: Diagnosis not present

## 2015-07-02 DIAGNOSIS — Z87440 Personal history of urinary (tract) infections: Secondary | ICD-10-CM | POA: Diagnosis not present

## 2015-07-02 DIAGNOSIS — E662 Morbid (severe) obesity with alveolar hypoventilation: Secondary | ICD-10-CM | POA: Diagnosis not present

## 2015-07-02 DIAGNOSIS — G4733 Obstructive sleep apnea (adult) (pediatric): Secondary | ICD-10-CM | POA: Diagnosis not present

## 2015-07-02 DIAGNOSIS — Z794 Long term (current) use of insulin: Secondary | ICD-10-CM | POA: Diagnosis not present

## 2015-07-02 DIAGNOSIS — I5042 Chronic combined systolic (congestive) and diastolic (congestive) heart failure: Secondary | ICD-10-CM | POA: Diagnosis not present

## 2015-07-02 DIAGNOSIS — Z7901 Long term (current) use of anticoagulants: Secondary | ICD-10-CM | POA: Diagnosis not present

## 2015-07-02 DIAGNOSIS — I130 Hypertensive heart and chronic kidney disease with heart failure and stage 1 through stage 4 chronic kidney disease, or unspecified chronic kidney disease: Secondary | ICD-10-CM | POA: Diagnosis not present

## 2015-07-02 DIAGNOSIS — Z7982 Long term (current) use of aspirin: Secondary | ICD-10-CM | POA: Diagnosis not present

## 2015-07-02 DIAGNOSIS — E785 Hyperlipidemia, unspecified: Secondary | ICD-10-CM | POA: Diagnosis not present

## 2015-07-02 DIAGNOSIS — E1122 Type 2 diabetes mellitus with diabetic chronic kidney disease: Secondary | ICD-10-CM | POA: Diagnosis not present

## 2015-07-02 DIAGNOSIS — R011 Cardiac murmur, unspecified: Secondary | ICD-10-CM | POA: Insufficient documentation

## 2015-07-02 DIAGNOSIS — G2581 Restless legs syndrome: Secondary | ICD-10-CM | POA: Diagnosis not present

## 2015-07-02 DIAGNOSIS — E1142 Type 2 diabetes mellitus with diabetic polyneuropathy: Secondary | ICD-10-CM | POA: Diagnosis not present

## 2015-07-02 DIAGNOSIS — Z9981 Dependence on supplemental oxygen: Secondary | ICD-10-CM | POA: Diagnosis not present

## 2015-07-02 NOTE — Telephone Encounter (Signed)
Left msg on triage requesting refill on her hydrocodone. MD is out of the office until 07/13/15, pls advise on refill...Johny Chess

## 2015-07-02 NOTE — Telephone Encounter (Signed)
Pt called to check up on this request. Please help, she is out

## 2015-07-02 NOTE — Progress Notes (Signed)
Patient ID: Melissa Myers, female   DOB: Aug 24, 1945, 70 y.o.   MRN: [id number]   PCP: Dr. Asa Lente Endocrinologist: Dr Loanne Drilling Cardiology: Dr Aundra Dubin.   70 yo with history of HTN, DM, hyperlipidemia, OHS/OSA, and chronic dyspnea/diastolic CHF.  Patient had an echo in 04/2009 showing moderate LVH and preserved LV systolic function. However, there was a very large LV mid-cavity gradient with valsalva.  Patient developed quite significant exertional dyspnea to the point where she was short of breath walking around her house. She had a pulmonary evaluation with Dr. Elsworth Soho but no primary lung problems were identified. Patient had an ETT-myoview in 05/2009 which was negative for ischemia or infarction. Right heart cath in 05/2009 showed mildly elevated right heart filling pressures but normal PA pressure and normal PCWP. She was started her on a beta blocker (Coreg) to try to lower her LV mid-cavity gradient (that likely occurs with exertion) and to better control BP.  V/Q scan was negative for PE.  PFTs from 06/2011 showed a restrictive defect. Last echo in 11/2013 showed EF 65% with normal RV size and systolic function.      Admitted 12/05/13 with volume overload. Diuresed with IV lasix and transitioned to torsemide 80 mg twice a day + metolazone. Overall she diuresed 21 pounds. Discharge weight was 263 pounds.   She was admitted in 9/16 with Strep agalactiae sepsis likely from cellulitis.    Admitted 12/1 though 01/26/15 with marked volume overload and cellulitis. Diuresed with Lasix drip and placed on antibiotics. Overall diuresed 20 pounds.   She returns for followup.  She remains very limited in terms of ambulation.  Uses walker around her house and wheelchair outside the house.  She is unsteady on her feet, has had PT.  No lightheadedness.  No dyspnea walking with her walker in the house.  +Bendopnea.  No chest pain.  Sleeps on several pillows.  No PND.  No palpitations.  Legs hurt "constantly."    Labs (10/13): K 4.1, creatinine 0.85 Labs (4/14): K 3.7, creatinine 1.2, BUN 43, BNP 23 Labs (5/14): K 4.1, creatinine 1.0 Labs (7/14): K 4.6 Creatinine 1.0 BNP 39.0  Labs (8/14): K 3.5, creatinine 0.91, BNP 71 Labs (11/14): K 3.7, creatinine 0.9 Labs (12/14): K 4.1, creatinine 0.8, BNP 52 Labs (3/15): K 4, creatinine 0.9 Labs (4/15): K 3.7, creatinine 0.9 Labs (5/15): K 3.8, creatinine 0.9, BNP 29 Labs (6/15): K 3.4, BUN 105, creatinine 0.9 =>1.7 Labs (6/17/5) K 4.2, BUN 41, Creatinine 0.80 Labs (08/19/13) K 3.2, BUN 71, creatinine 1.5 Labs 09/05/13 K 4.1 Creatinine 1.1 Labs 11/07/13 K 3.6 Creatinine 0.91 Labs 12/11/13 K 4.5 Creatine 1.05 Labs (12/15): K 3.8, creatinine 1.03 Labs (03/17/2014): K 4.1 Creatinine 0.99  Labs (4/16): K 3.7, creatinine 1.24 Labs (5/16): LDL 108, TGs 699 Labs (07/24/2014) K 3.5 Creatinine 1.17  Labs (07/31/2014) K 3.3 Creatinine 1.13 Labs (9/16): K 3.5, creatinine 0.93 Labs (01/26/2015) K 3.6 Creatinine 1.36  Labs 04/22/15 K 3.8, Creatinine 1.66 Labs (4/17): K 4.6, creatinine 1.74, HCT 37.9  Allergies (verified):  1) ! Sulfa  2) Morphine   Past Medical History:  1. Diabetes mellitus, type II  2. Hyperlipidemia: She has been unable to tolerate statins (has been on Vytorin, lovastatin, and Crestor) due to what sounds like rhabdo: developed muscle weakness and was told she had "muscle damage" with each of these statins. She has been told not to take statins anymore.  Has hypertriglyceridemia.  3. Hypertension 4. Obesity  5. Restless Leg Syndrome  6. OA  7. peripheral neuropathy  8. OHS/OSA: Uses supplemental oxygen, does not tolerate CPAP.   9. Chronic nausea/diarrhea: ? IBS  10. Diastolic CHF: Echo ([id number]) with normal LV size, moderate LVH, EF 65-70%, LV mid-cavity gradient reaching 63 mmHg with valsalva but minimal at rest, grade I diastolic dysfunction, cannot estimate PA systolic pressure (no TR doppler signal), RV normal. RHC (4/11): Mean RA 11, RV  37/13, PA 33/15, mean PCWP 12, CI 3.3. Echo in 11/12 showed mild LVH, EF 65-70%, no LV mid-cavity gradient was mentioned.  Echo in 4/14 showed EF 65-70%, mild LVH, grade I diastolic dysfunction, PA systolic pressure 35 mmHg, mild LV mid-cavity gradient.  Echo (5/15) with EF 60-65%, mild TR, normal RV size and systolic function. Echo (10/15) with EF 65%, moderate LVH, normal RV.  11. ETT-myoview (4/11): 5'30", stopped due to fatigue, normal EF, no evidence for scar or ischemia. 12. V/Q scan 11/12: negative for PE.  Lower extremity venous doppler US (3/14): negative for DVT.  13. PFTs (5/13): FVC 50%, FEV1 62%, ratio 106%, DLCO 69%, TLC 69%.  60. CKD  Family History:  Family History of Alcoholism/Addiction (parent)  Family History Diabetes 1st degree relative (grandparent)  Family History High cholesterol (parent, grandparent)  Family History Hypertension (parent, grandparent)  Family History Lung cancer (grandparent)  Stomach cancer (grandmother)  Celiac Sprue daughter  Mother with MI at 29, father with MI at 40, uncle with MI at 53, brother with stents in his 71s, multiple aunts with MIs and CVAs   Social History:  Never Smoked  no alcohol  married, lives with spouse and her mother  retired Network engineer Alcohol Use - no  Illicit Drug Use - no  Review of Systems  All systems reviewed and negative except as per HPI.   ROS: All systems reviewed and negative except as per HPI.   Current Outpatient Prescriptions  Medication Sig Dispense Refill  . aspirin EC 81 MG tablet Take 1 tablet (81 mg total) by mouth daily.    . B Complex-C (B-COMPLEX WITH VITAMIN C) tablet Take 1 tablet by mouth daily.    . carvedilol (COREG) 25 MG tablet TAKE 1 TABLET BY MOUTH TWICE DAILY WITH FOOD 60 tablet 5  . Cholecalciferol (VITAMIN D3) 2000 units capsule Take 1 capsule (2,000 Units total) by mouth daily. 90 capsule 3  . citalopram (CELEXA) 20 MG tablet Take 1 tablet (20 mg total) by mouth daily. 30 tablet 3   . clotrimazole (MYCELEX) 10 MG troche Take 1 tablet (10 mg total) by mouth 5 (five) times daily. 140 tablet 2  . Coenzyme Q10 200 MG capsule Take 200 mg by mouth daily.    . diclofenac sodium (VOLTAREN) 1 % GEL Apply 2 g topically daily as needed (knee pain). 10 g 3  . Dietary Management Product (VASCULERA) TABS Take 1 capsule by mouth daily. 30 tablet 11  . DULoxetine (CYMBALTA) 60 MG capsule Take 60 mg by mouth daily.    [name] ezetimibe (ZETIA) 10 MG tablet Take 1 tablet (10 mg total) by mouth daily. 90 tablet 3  . fenofibrate 160 MG tablet TAKE 1 TABLET (160 MG TOTAL) BY MOUTH DAILY. MUST ESTABLISH WITH NEW PCP FOR ADDITIONAL REFILLS. 90 tablet 3  . gabapentin (NEURONTIN) 300 MG capsule TAKE 2 CAPSULES (600 MG TOTAL) BY MOUTH 4 (FOUR) TIMES DAILY. 240 capsule 0  . hydrALAZINE (APRESOLINE) 25 MG tablet TAKE 1 TABLET (25 MG TOTAL) BY MOUTH 3 (THREE) TIMES DAILY. 90 tablet 2  .  hydrOXYzine (ATARAX/VISTARIL) 10 MG tablet Take 10 mg by mouth 3 (three) times daily as needed for anxiety. Reported on 02/10/2015    . insulin NPH Human (NOVOLIN N) 100 UNIT/ML injection Inject 1.8 mLs (180 Units total) into the skin every morning. 60 mL 11  . ketoconazole (NIZORAL) 2 % cream Apply 1 application topically daily. Reported on 03/19/2015    . lactobacillus (FLORANEX/LACTINEX) PACK Take 1 packet (1 g total) by mouth 3 (three) times daily with meals. 30 packet 0  . Magnesium 500 MG TABS Take 500 mg by mouth daily.    . metolazone (ZAROXOLYN) 5 MG tablet TAKE 5mg  (1 tablet) Saturday.  Can take an additional as needed for wt gain 3lbs overnight or 5 lbs within one week. 15 tablet 3  . nystatin-triamcinolone ointment (MYCOLOG) Apply 1 application topically 2 (two) times daily. 60 g 2  . omega-3 acid ethyl esters (LOVAZA) 1 g capsule Take 1 capsule (1 g total) by mouth 2 (two) times daily. 180 capsule 3  . potassium chloride 20 MEQ TBCR Take 40 mEq by mouth 2 (two) times daily. With an additional 40 on Sat 145 tablet 6   . rOPINIRole (REQUIP) 2 MG tablet Take 1 tablet (2 mg total) by mouth 3 (three) times daily as needed. 270 tablet 3  . spironolactone (ALDACTONE) 25 MG tablet TAKE 1 TABLET BY MOUTH DAILY 30 tablet 2  . torsemide (DEMADEX) 100 MG tablet TAKE 1 TABLET (100 MG TOTAL) BY MOUTH 2 (TWO) TIMES DAILY. 60 tablet 5  . triamcinolone cream (KENALOG) 0.1 % Apply 1 application topically 2 (two) times daily as needed (rash). Reported on 05/04/2015    . vitamin B-12 (CYANOCOBALAMIN) 500 MCG tablet Take 500 mcg by mouth daily. Reported on 03/19/2015     No current facility-administered medications for this encounter.   Filed Vitals:   07/01/15 0957  BP: 124/58  Pulse: 65  Weight: 254 lb 8 oz (115.44 kg)  SpO2: 98%   Wt Readings from Last 3 Encounters:  07/01/15 254 lb 8 oz (115.44 kg)  06/11/15 261 lb (118.389 kg)  05/18/15 253 lb (114.76 kg)     General: NAD, obese. Husband present . Arrived in wheelchair.   Neck: Thick, JVP difficult to assess, estimate 8 cm.  No thyromegaly or thyroid nodule.  Lungs: Clear . Chronic 2L oxygen CV: Nondisplaced PMI.  Heart regular S1/S2, no [id number], 3/6 systolic murmur along there sternal border.  No carotid bruit.  Abdomen: obese, mildly distended, nontender, no hepatosplenomegaly Neurologic: Alert and oriented x 3.  Psych: Normal affect. Extremities: No clubbing/cyanosis, bilateral severe venous stasis changes. R and LLE trace-1 + edema with chronic hyperpigmentation.    Assessment/Plan: 1. Chronic diastolic CHF:  Unchanged NYHA III. Volume status appears stable on current diuretic regimen. - Continue torsemide 100 mg twice a day and metolazone 2.5 mg on Saturdays.  - BMET/BNP today.  - Reinforced daily weight , low salt food choices, and limiting fluid intake to < 2 liters per day.  - Continue spiro 25 mg daily.  2. OHS:  Followed by Dr Elsworth Soho, has previously stated no role for CPAP.  - Should use home oxygen continuously  3. Obesity: Discussed limiting  portions and increasing physical activity as able.  4. HTN: . Stable. Continue current regimen.   5. Hyperlipidemia:  High triglycerides.  Currently on Lovaza, Zetia, and fenofibrate.  Check lipids today.  6. Imbalance: Likely related to diabetic peripheral neuropathy.  Very limiting.  7. CKD: Stage  III.  Creatinine has been slowly trending upwards.  BMET today.  8. PAD: Legs hurt "constantly."  Unable to palpate peripheral pulses, will check lower extremity arterial dopplers.  9. Murmur: Has had vigorous LV contraction with LV mid-cavity gradient in the past (is on Coreg).  Murmur seems louder today, will repeat echo.     Followup in 2 months.  Loralie Champagne 07/02/2015

## 2015-07-02 NOTE — Telephone Encounter (Signed)
Please help.

## 2015-07-02 NOTE — Telephone Encounter (Signed)
I dont see hydrocodone listed as a chronic medication  I would not feel comfortable prescribing this

## 2015-07-02 NOTE — Telephone Encounter (Signed)
I am not in the office this afternoon --  needs to be sent to someone in the office or I can do tomorrow.

## 2015-07-03 ENCOUNTER — Other Ambulatory Visit (HOSPITAL_COMMUNITY): Payer: Self-pay | Admitting: Cardiology

## 2015-07-03 ENCOUNTER — Telehealth: Payer: Self-pay | Admitting: Internal Medicine

## 2015-07-03 DIAGNOSIS — I739 Peripheral vascular disease, unspecified: Secondary | ICD-10-CM

## 2015-07-03 NOTE — Telephone Encounter (Signed)
Notified pt w/MD response. Pt states she was not told that med was d/c she is wanting medication. Inform pt that she will need to f/u w/Dr. Sharlet Salina. She then stated that she was changing over to Dr. Ronnald Ramp. Inform pt either way she will need to make appt to discuss the hydrocodone because med has been taking off her medication list & due it being a narcotic she will need OV...Melissa Myers

## 2015-07-03 NOTE — Telephone Encounter (Signed)
Pt called in and would like to transfer from [place] to [place].  Is this ok with both of you?

## 2015-07-05 NOTE — Telephone Encounter (Signed)
I can't take her on right now

## 2015-07-06 ENCOUNTER — Other Ambulatory Visit: Payer: Self-pay

## 2015-07-06 ENCOUNTER — Telehealth (HOSPITAL_COMMUNITY): Payer: Self-pay | Admitting: Cardiology

## 2015-07-06 DIAGNOSIS — I509 Heart failure, unspecified: Secondary | ICD-10-CM

## 2015-07-06 MED ORDER — OMEGA-3-ACID ETHYL ESTERS 1 G PO CAPS: 2.0000 g | ORAL_CAPSULE | Freq: Two times a day (BID) | ORAL | Status: DC

## 2015-07-06 NOTE — Patient Outreach (Signed)
[facility]) Care Management  07/06/2015  Melissa Myers [birth year] [id number]  RNCM called to follow up on referral for care connectins program. Member reports she is active with care connections. Member also reports that advanced home care is still involved and Katie from the Paramedicine team is still following her. Member reports her blood sugars have been going good. Member checked her blood sugar while she was on the phone and blood sugar was 207. Member states she had just had a sandwich. RNCM reviewed heart failure zone tool, member is slightly forgetful about some parts of the zone tool. RNCM reinforced the zone tool. Member acknowledges that she has numerous people coming to check on her. RNCM reinforced resources if she has any questions.  RNCM discussed case closure. Member is agreeable.  Plan: close case.   Thea Silversmith, RN, MSN, [facility] Coordinator Cell: [phone]

## 2015-07-06 NOTE — Telephone Encounter (Signed)
Notes Recorded by Larey Dresser, MD on 07/02/2015 at 6:28 AM Creatinine is better. Needs to keep fluid intake < 2000 cc a day with low sodium level. Triglycerides very high, risk for pancreatitis. Make sure that she is taking fenofibrate 160 mg daily and needs to increase Lovaza to 2 g bid with repeat lipids in 2 months.  PATIENT AWARE AND VOICED UNDERSTANDING

## 2015-07-07 ENCOUNTER — Other Ambulatory Visit (HOSPITAL_COMMUNITY): Payer: Self-pay | Admitting: Cardiology

## 2015-07-07 DIAGNOSIS — R0989 Other specified symptoms and signs involving the circulatory and respiratory systems: Secondary | ICD-10-CM

## 2015-07-08 ENCOUNTER — Ambulatory Visit (INDEPENDENT_AMBULATORY_CARE_PROVIDER_SITE_OTHER): Payer: PPO | Admitting: Podiatry

## 2015-07-08 ENCOUNTER — Encounter: Payer: Self-pay | Admitting: Podiatry

## 2015-07-08 VITALS — BP 118/63 | HR 70 | Resp 16

## 2015-07-08 DIAGNOSIS — E1042 Type 1 diabetes mellitus with diabetic polyneuropathy: Secondary | ICD-10-CM

## 2015-07-08 DIAGNOSIS — B351 Tinea unguium: Secondary | ICD-10-CM | POA: Diagnosis not present

## 2015-07-08 DIAGNOSIS — M79605 Pain in left leg: Secondary | ICD-10-CM

## 2015-07-08 DIAGNOSIS — M79674 Pain in right toe(s): Secondary | ICD-10-CM

## 2015-07-08 DIAGNOSIS — M779 Enthesopathy, unspecified: Secondary | ICD-10-CM

## 2015-07-08 DIAGNOSIS — Q828 Other specified congenital malformations of skin: Secondary | ICD-10-CM

## 2015-07-08 DIAGNOSIS — M79675 Pain in left toe(s): Secondary | ICD-10-CM

## 2015-07-08 DIAGNOSIS — M79604 Pain in right leg: Secondary | ICD-10-CM

## 2015-07-08 NOTE — Progress Notes (Signed)
Subjective:     Patient ID: Melissa Myers, female   DOB: 10/26/1945, 70 y.o.   MRN: [id number]  HPI patient presents stating that she hasn't area on the bottom of the right big toe that had some drainage and crusted tissue and that she is a long-term diabetic and is in poor health. She presents with caregiver's and also has nail disease that they cannot cut that get thick and become painful over time   Review of Systems  All other systems reviewed and are negative.      Objective:   Physical Exam  Constitutional: She is oriented to person, place, and time.  Musculoskeletal: Normal range of motion.  Neurological: She is oriented to person, place, and time.  Skin: Skin is warm and dry.  Nursing note and vitals reviewed.  vascular status found to be diminished with diminished sharp [place] vibratory also noted. PT and DP pulses are diminished and she does see a cardiologist and has her circulation checked. Plantar aspect right hallux there is a keratotic lesion that has irritative type tissue formation and she states that there was some previous drainage. She has nail disease 1-5 both feet with thickness and incurvation of the beds and they become painful when pressed and she is well oriented and does have slow digital perfusion     Assessment:     At risk diabetic with ulceration plantar aspect right hallux that healed but is pre-ulcerative in nature along with nail disease 1-5 both feet with yellow subungual debris and pain    Plan:     H&P and diabetic education rendered to patient. Today I debrided nailbeds 1-5 both feet and I debrided the keratotic tissue plantar aspect right that is at risk. I applied cushion to take pressure off the area and then recommended long-term diabetic shoes to try to prevent further ulceration of this area and instructed on daily inspections and to let me know if any changes were to occur. Patient is scheduled for reappoint

## 2015-07-09 ENCOUNTER — Telehealth: Payer: Self-pay | Admitting: Geriatric Medicine

## 2015-07-09 ENCOUNTER — Telehealth: Payer: Self-pay | Admitting: Internal Medicine

## 2015-07-09 DIAGNOSIS — Z87440 Personal history of urinary (tract) infections: Secondary | ICD-10-CM | POA: Diagnosis not present

## 2015-07-09 DIAGNOSIS — E785 Hyperlipidemia, unspecified: Secondary | ICD-10-CM | POA: Diagnosis not present

## 2015-07-09 DIAGNOSIS — E662 Morbid (severe) obesity with alveolar hypoventilation: Secondary | ICD-10-CM | POA: Diagnosis not present

## 2015-07-09 DIAGNOSIS — E1142 Type 2 diabetes mellitus with diabetic polyneuropathy: Secondary | ICD-10-CM | POA: Diagnosis not present

## 2015-07-09 DIAGNOSIS — N183 Chronic kidney disease, stage 3 (moderate): Secondary | ICD-10-CM | POA: Diagnosis not present

## 2015-07-09 DIAGNOSIS — I130 Hypertensive heart and chronic kidney disease with heart failure and stage 1 through stage 4 chronic kidney disease, or unspecified chronic kidney disease: Secondary | ICD-10-CM | POA: Diagnosis not present

## 2015-07-09 DIAGNOSIS — Z7901 Long term (current) use of anticoagulants: Secondary | ICD-10-CM | POA: Diagnosis not present

## 2015-07-09 DIAGNOSIS — E1122 Type 2 diabetes mellitus with diabetic chronic kidney disease: Secondary | ICD-10-CM | POA: Diagnosis not present

## 2015-07-09 DIAGNOSIS — G4733 Obstructive sleep apnea (adult) (pediatric): Secondary | ICD-10-CM | POA: Diagnosis not present

## 2015-07-09 DIAGNOSIS — L03115 Cellulitis of right lower limb: Secondary | ICD-10-CM | POA: Diagnosis not present

## 2015-07-09 DIAGNOSIS — Z794 Long term (current) use of insulin: Secondary | ICD-10-CM | POA: Diagnosis not present

## 2015-07-09 DIAGNOSIS — Z7982 Long term (current) use of aspirin: Secondary | ICD-10-CM | POA: Diagnosis not present

## 2015-07-09 DIAGNOSIS — L03116 Cellulitis of left lower limb: Secondary | ICD-10-CM | POA: Diagnosis not present

## 2015-07-09 DIAGNOSIS — Z5181 Encounter for therapeutic drug level monitoring: Secondary | ICD-10-CM | POA: Diagnosis not present

## 2015-07-09 DIAGNOSIS — G2581 Restless legs syndrome: Secondary | ICD-10-CM | POA: Diagnosis not present

## 2015-07-09 DIAGNOSIS — M170 Bilateral primary osteoarthritis of knee: Secondary | ICD-10-CM | POA: Diagnosis not present

## 2015-07-09 DIAGNOSIS — Z9981 Dependence on supplemental oxygen: Secondary | ICD-10-CM | POA: Diagnosis not present

## 2015-07-09 DIAGNOSIS — I5042 Chronic combined systolic (congestive) and diastolic (congestive) heart failure: Secondary | ICD-10-CM | POA: Diagnosis not present

## 2015-07-09 DIAGNOSIS — M47816 Spondylosis without myelopathy or radiculopathy, lumbar region: Secondary | ICD-10-CM | POA: Diagnosis not present

## 2015-07-09 NOTE — Telephone Encounter (Signed)
Spoke to patient. She needs an ov to discuss pain because hydrocodone is no longer on her medication list.

## 2015-07-09 NOTE — Telephone Encounter (Signed)
Pt mother called in and said that pt is crying and hurting and needs her pain meds asap?

## 2015-07-09 NOTE — Telephone Encounter (Signed)
Patient called for pain medication - hydrocodone. It is not on her list. She needs an office visit. Her home health nurse got on the phone to inform me that she had a fall 2 days ago. She has bruising on toes. Podiatry evaluated and said the toe does not look broken.

## 2015-07-10 ENCOUNTER — Encounter: Payer: Self-pay | Admitting: Family

## 2015-07-10 ENCOUNTER — Ambulatory Visit (INDEPENDENT_AMBULATORY_CARE_PROVIDER_SITE_OTHER): Payer: PPO | Admitting: Endocrinology

## 2015-07-10 ENCOUNTER — Ambulatory Visit (INDEPENDENT_AMBULATORY_CARE_PROVIDER_SITE_OTHER): Payer: PPO | Admitting: Family

## 2015-07-10 ENCOUNTER — Ambulatory Visit (INDEPENDENT_AMBULATORY_CARE_PROVIDER_SITE_OTHER)
Admission: RE | Admit: 2015-07-10 | Discharge: 2015-07-10 | Disposition: A | Discharge status: Home / Self Care | Payer: PPO | Source: Ambulatory Visit | Attending: Family | Admitting: Family

## 2015-07-10 ENCOUNTER — Other Ambulatory Visit (INDEPENDENT_AMBULATORY_CARE_PROVIDER_SITE_OTHER): Payer: PPO

## 2015-07-10 ENCOUNTER — Encounter: Payer: Self-pay | Admitting: Endocrinology

## 2015-07-10 VITALS — BP 108/60 | HR 66 | Temp 97.9°F | Wt 250.5 lb

## 2015-07-10 VITALS — BP 112/58 | HR 58 | Temp 97.8°F | Resp 18 | Ht <= 58 in | Wt 251.0 lb

## 2015-07-10 DIAGNOSIS — R42 Dizziness and giddiness: Secondary | ICD-10-CM

## 2015-07-10 DIAGNOSIS — G894 Chronic pain syndrome: Secondary | ICD-10-CM

## 2015-07-10 DIAGNOSIS — M79672 Pain in left foot: Secondary | ICD-10-CM | POA: Insufficient documentation

## 2015-07-10 DIAGNOSIS — E1129 Type 2 diabetes mellitus with other diabetic kidney complication: Secondary | ICD-10-CM | POA: Diagnosis not present

## 2015-07-10 DIAGNOSIS — S99922A Unspecified injury of left foot, initial encounter: Secondary | ICD-10-CM | POA: Diagnosis not present

## 2015-07-10 LAB — CBC
HEMATOCRIT: 37.6 % (ref 36.0–46.0)
Hemoglobin: 12.5 g/dL (ref 12.0–15.0)
MCHC: 33.2 g/dL (ref 30.0–36.0)
MCV: 79.6 fl (ref 78.0–100.0)
Platelets: 425 10*3/uL — ABNORMAL HIGH (ref 150.0–400.0)
RBC: 4.73 Mil/uL (ref 3.87–5.11)
RDW: 15.1 % (ref 11.5–15.5)
WBC: 10 10*3/uL (ref 4.0–10.5)

## 2015-07-10 LAB — COMPREHENSIVE METABOLIC PANEL
ALBUMIN: 3.6 g/dL (ref 3.5–5.2)
ALK PHOS: 76 U/L (ref 39–117)
ALT: 17 U/L (ref 0–35)
AST: 27 U/L (ref 0–37)
BUN: 75 mg/dL — ABNORMAL HIGH (ref 6–23)
CALCIUM: 9.6 mg/dL (ref 8.4–10.5)
CHLORIDE: 90 meq/L — AB (ref 96–112)
CO2: 35 mEq/L — ABNORMAL HIGH (ref 19–32)
CREATININE: 1.81 mg/dL — AB (ref 0.40–1.20)
GFR: 29.37 mL/min — ABNORMAL LOW (ref >60.00)
Glucose, Bld: 116 mg/dL — ABNORMAL HIGH (ref 70–99)
Potassium: 3 mEq/L — ABNORMAL LOW (ref 3.5–5.1)
Sodium: 134 mEq/L — ABNORMAL LOW (ref 135–145)
Total Bilirubin: 0.4 mg/dL (ref 0.2–1.2)
Total Protein: 7.1 g/dL (ref 6.0–8.3)

## 2015-07-10 LAB — POCT GLYCOSYLATED HEMOGLOBIN (HGB A1C): Hemoglobin A1C: 12.4

## 2015-07-10 MED ORDER — HYDROCODONE-ACETAMINOPHEN 7.5-325 MG PO TABS: 1.0000 | ORAL_TABLET | Freq: Four times a day (QID) | ORAL | Status: DC | PRN

## 2015-07-10 MED ORDER — INSULIN NPH (HUMAN) (ISOPHANE) 100 UNIT/ML ~~LOC~~ SUSP: 220.0000 [IU] | SUBCUTANEOUS | Status: DC

## 2015-07-10 NOTE — Progress Notes (Signed)
Subjective:    Patient ID: Melissa Myers, female    DOB: 02-May-1945, 70 y.o.   MRN: [id number]  Chief Complaint  Patient presents with  . Leg Pain    is asking for hydrocodone for the pain in both legs, also wants her toes looked at she hurt both big toes    HPI:  Melissa Myers is a 70 y.o. female who  has a past medical history of Type II or unspecified type diabetes mellitus without mention of complication, not stated as uncontrolled; Hyperlipidemia; Hypertension; Diastolic CHF ([place]); Chronic diarrhea; RLS (restless legs syndrome); Osteoarthritis; Stasis dermatitis; GERD (gastroesophageal reflux disease); On home oxygen therapy; Anemia; Neuropathy ([place]); Disc degeneration, lumbar; OSA (obstructive sleep apnea); Deaf; Shortness of breath; and Cellulitis. and presents today for a follow up office visit.   1.) Chronic pain - Continues to experience the associated symptoms of chronic pain secondary to her neuropathy and lower back problems. Previously prescribed Hydocodone-acetaminophen. Reports taking the medication as prescribed and denies adverse side effects or constipation. Notes that her symptoms and functionality were improved with this medication.   2.) Foot pain - Recently experienced a fall stubbing her left foot in the process. Describes that she has been a little "woozy" lately with concern for medication interactions. Symptoms generally occur with changes in position, but has also been sleepier more lately. Her blood sugars have been high. Modifying factors for her foot include ice which has helped minimally with her symptoms. Pain is primary located in her left great toe and phalange.  Allergies  Allergen Reactions  . Cymbalta [Duloxetine Hcl] Other (See Comments)    Restless leg syndrome  . Statins Other (See Comments)    Statin drugs cause muscle pain / "muscle damage"--was told by MD not to take  . Sulfa Antibiotics Diarrhea  . Levemir [Insulin Detemir] Itching    . Morphine Other (See Comments)    GI upset and headaches  . Zinc Swelling and Rash     Current Outpatient Prescriptions on File Prior to Visit  Medication Sig Dispense Refill  . aspirin EC 81 MG tablet Take 1 tablet (81 mg total) by mouth daily.    . B Complex-C (B-COMPLEX WITH VITAMIN C) tablet Take 1 tablet by mouth daily.    . carvedilol (COREG) 25 MG tablet TAKE 1 TABLET BY MOUTH TWICE DAILY WITH FOOD 60 tablet 5  . Cholecalciferol (VITAMIN D3) 2000 units capsule Take 1 capsule (2,000 Units total) by mouth daily. 90 capsule 3  . citalopram (CELEXA) 20 MG tablet Take 1 tablet (20 mg total) by mouth daily. 30 tablet 3  . clotrimazole (MYCELEX) 10 MG troche Take 1 tablet (10 mg total) by mouth 5 (five) times daily. 140 tablet 2  . Coenzyme Q10 200 MG capsule Take 200 mg by mouth daily.    . diclofenac sodium (VOLTAREN) 1 % GEL Apply 2 g topically daily as needed (knee pain). 10 g 3  . Dietary Management Product (VASCULERA) TABS Take 1 capsule by mouth daily. 30 tablet 11  . DULoxetine (CYMBALTA) 60 MG capsule Take 60 mg by mouth daily.    [name] ezetimibe (ZETIA) 10 MG tablet Take 1 tablet (10 mg total) by mouth daily. 90 tablet 3  . fenofibrate 160 MG tablet TAKE 1 TABLET (160 MG TOTAL) BY MOUTH DAILY. MUST ESTABLISH WITH NEW PCP FOR ADDITIONAL REFILLS. 90 tablet 3  . gabapentin (NEURONTIN) 300 MG capsule TAKE 2 CAPSULES (600 MG TOTAL) BY MOUTH 4 (FOUR)  TIMES DAILY. 240 capsule 0  . hydrALAZINE (APRESOLINE) 25 MG tablet TAKE 1 TABLET (25 MG TOTAL) BY MOUTH 3 (THREE) TIMES DAILY. 90 tablet 2  . insulin NPH Human (NOVOLIN N) 100 UNIT/ML injection Inject 2.2 mLs (220 Units total) into the skin every morning. 80 mL 11  . ketoconazole (NIZORAL) 2 % cream Apply 1 application topically daily. Reported on 03/19/2015    . lactobacillus (FLORANEX/LACTINEX) PACK Take 1 packet (1 g total) by mouth 3 (three) times daily with meals. 30 packet 0  . Magnesium 500 MG TABS Take 500 mg by mouth daily.    .  metolazone (ZAROXOLYN) 5 MG tablet TAKE 17m (1 tablet) Saturday.  Can take an additional as needed for wt gain 3lbs overnight or 5 lbs within one week. 15 tablet 3  . nystatin-triamcinolone ointment (MYCOLOG) Apply 1 application topically 2 (two) times daily. 60 g 2  . omega-3 acid ethyl esters (LOVAZA) 1 g capsule Take 2 capsules (2 g total) by mouth 2 (two) times daily. 270 capsule 3  . potassium chloride 20 MEQ TBCR Take 40 mEq by mouth 2 (two) times daily. With an additional 40 on Sat 145 tablet 6  . rOPINIRole (REQUIP) 2 MG tablet Take 1 tablet (2 mg total) by mouth 3 (three) times daily as needed. 270 tablet 3  . spironolactone (ALDACTONE) 25 MG tablet TAKE 1 TABLET BY MOUTH DAILY 30 tablet 2  . torsemide (DEMADEX) 100 MG tablet TAKE 1 TABLET (100 MG TOTAL) BY MOUTH 2 (TWO) TIMES DAILY. 60 tablet 5  . triamcinolone cream (KENALOG) 0.1 % Apply 1 application topically 2 (two) times daily as needed (rash). Reported on 05/04/2015    . vitamin B-12 (CYANOCOBALAMIN) 500 MCG tablet Take 500 mcg by mouth daily. Reported on 03/19/2015     No current facility-administered medications on file prior to visit.     Past Surgical History  Procedure Laterality Date  . Tubal ligation  1980  . Cholecystectomy  1997  . Uterine polyp removal  2008  . Umbilical hernia repair  1995  . Tonsillectomy  1970  . Colonoscopy N/A 12/03/2012    Procedure: COLONOSCOPY;  Surgeon: DLafayette Dragon MD;  Location: [facility];  Service: Endoscopy;  Laterality: N/A;      Review of Systems  Constitutional: Negative for fever and chills.  Respiratory: Negative for chest tightness and shortness of breath.   Musculoskeletal: Positive for back pain.       Positive for left foot pain.   Neurological: Positive for numbness.      Objective:    BP 112/58 mmHg  Pulse 58  Temp(Src) 97.8 F (36.6 C) (Oral)  Resp 18  Ht 4' 10"  (1.473 m)  Wt 251 lb (113.853 kg)  BMI 52.47 kg/m2  SpO2 94% Nursing note and vital signs  reviewed.  Physical Exam  Constitutional: She is oriented to person, place, and time. She appears well-developed and well-nourished. No distress.  Cardiovascular: Normal rate, regular rhythm, normal heart sounds and intact distal pulses.   Pulmonary/Chest: Effort normal and breath sounds normal.  Musculoskeletal:  Left foot - No obvious deformity with mild contusion like discoloration and mild/moderate edema. Great toe is tender to palpation. ROM is slightly restricted secondary to pain. Capillary refill and pulses are intact and appropriate.   Neurological: She is alert and oriented to person, place, and time.  Skin: Skin is warm and dry.  Psychiatric: She has a normal mood and affect. Her behavior is normal. Judgment  and thought content normal.       Assessment & Plan:   Problem List Items Addressed This Visit      Other   Chronic pain syndrome    Chronic pain syndrome with improved control with hydrocodone-acetaminophen. Decrease dosage of hydrocodone-acetaminophen secondary to dizziness. Denies adverse side effects or constipation. Follow up with PCP for additional refills.       Relevant Medications   HYDROcodone-acetaminophen (NORCO) 7.5-325 MG tablet   Left foot pain - Primary    This is a new problem. Left foot pain with concern for possible fracture given mechanism and current symptoms. Treat conservatively with ice and supportive shoes. Obtain x-ray. Follow up pending results of x-ray or if symptoms worsen or do not improve.       Relevant Medications   HYDROcodone-acetaminophen (NORCO) 7.5-325 MG tablet   Other Relevant Orders   DG Foot Complete Left   Dizziness    Dizziness with concern for lower blood pressure. Discontinue/hold hydralazine. Encourage to increase fluid intake to reduce risk of dehydration. Reduce pain medication and use as sparingly to reduce risk increased dizziness. Obtain CMET and CBC. Follow up pending results or if symptoms worsen.       Relevant  Orders   Comp Met (CMET) (Completed)   CBC (Completed)      I have discontinued Ms. Fallaw's hydrOXYzine. I am also having her maintain her aspirin EC, ezetimibe, B-complex with vitamin C, Magnesium, hydrALAZINE, diclofenac sodium, lactobacillus, carvedilol, vitamin B-12, triamcinolone cream, ketoconazole, fenofibrate, Coenzyme Q10, nystatin-triamcinolone ointment, spironolactone, DULoxetine, metolazone, torsemide, rOPINIRole, citalopram, Potassium Chloride ER, gabapentin, VASCULERA, clotrimazole, Vitamin D3, omega-3 acid ethyl esters, insulin NPH Human, and HYDROcodone-acetaminophen.   Meds ordered this encounter  Medications  . DISCONTD: HYDROcodone-acetaminophen (NORCO) 7.5-325 MG tablet    Sig: Take 1 tablet by mouth every 6 (six) hours as needed for moderate pain.    Dispense:  30 tablet    Refill:  0    Order Specific Question:  Supervising Provider    Answer:  Pricilla Holm A [[id number]]  . HYDROcodone-acetaminophen (NORCO) 7.5-325 MG tablet    Sig: Take 1 tablet by mouth every 6 (six) hours as needed for moderate pain.    Dispense:  120 tablet    Refill:  0    Order Specific Question:  Supervising Provider    Answer:  Pricilla Holm A [[id number]]     Follow-up: Return if symptoms worsen or fail to improve.  Mauricio Po, FNP

## 2015-07-10 NOTE — Patient Instructions (Signed)
check your blood sugar twice a day.  vary the time of day when you check, between before the 3 meals, and at bedtime.  also check if you have symptoms of your blood sugar being too high or too low.  please keep a record of the readings and bring it to your next appointment here.  You can write it on any piece of paper.  please call us sooner if your blood sugar goes below 70, or if you have a lot of readings over 200.  Please increase the insulin to 220 units each morning.   Please call us next week, to tell us how the blood sugar is doing.   Please come back for a follow-up appointment in 2 months.

## 2015-07-10 NOTE — Assessment & Plan Note (Signed)
Chronic pain syndrome with improved control with hydrocodone-acetaminophen. Decrease dosage of hydrocodone-acetaminophen secondary to dizziness. Denies adverse side effects or constipation. Follow up with PCP for additional refills.

## 2015-07-10 NOTE — Progress Notes (Signed)
Pre visit review using our clinic review tool, if applicable. No additional management support is needed unless otherwise documented below in the visit note. 

## 2015-07-10 NOTE — Assessment & Plan Note (Signed)
Dizziness with concern for lower blood pressure. Discontinue/hold hydralazine. Encourage to increase fluid intake to reduce risk of dehydration. Reduce pain medication and use as sparingly to reduce risk increased dizziness. Obtain CMET and CBC. Follow up pending results or if symptoms worsen.

## 2015-07-10 NOTE — Progress Notes (Signed)
Subjective:    Patient ID: Melissa Myers, female    DOB: [birth year], 70 y.o.   MRN: [id number]  HPI Pt returns for f/u of diabetes mellitus: DM type: Insulin-requiring type 2.   Dx'ed: [id number] Complications: sensory neuropathy of the lower extremities, renal insufficiency, and leg ulcers.  Therapy: insulin since 2011 GDM: never DKA: never Severe hypoglycemia: several times; most recently in early 2017.  Pancreatitis: never Other: she takes QD insulin, after poor results with multiple daily injections; she is too ill to undergo weight loss surgery.  she takes human insulin, due to cost.   Interval history: no cbg record, but states cbg's vary from 200-600.  It is in general higher as the day goes on.   pt states she feels well in general, except for chronic foot pain.  She says she never misses the insulin.   Past Medical History  Diagnosis Date  . Type II or unspecified type diabetes mellitus without mention of complication, not stated as uncontrolled     insulin dep  . Hyperlipidemia     hx rhabdo on statins  . Hypertension   . Diastolic CHF ([place])   . Chronic diarrhea     a/w nausea - felt related to IBS  . RLS (restless legs syndrome)   . Osteoarthritis   . Stasis dermatitis   . GERD (gastroesophageal reflux disease)   . On home oxygen therapy     uses oxygen 2 liters min per [place] at night and prn during day  . Anemia   . Neuropathy ([facility])     feet, toes and fingers  . Disc degeneration, lumbar   . OSA (obstructive sleep apnea)     05/2009 sleep study - refuses CPAP  . Deaf     left side only  . Shortness of breath     chronic  . Cellulitis     LOWER EXTREMITIES    Past Surgical History  Procedure Laterality Date  . Tubal ligation  1980  . Cholecystectomy  1997  . Uterine polyp removal  2008  . Umbilical hernia repair  1995  . Tonsillectomy  1970  . Colonoscopy N/A 12/03/2012    Procedure: COLONOSCOPY;  Surgeon: Lafayette Dragon, MD;  Location: [facility];   Service: Endoscopy;  Laterality: N/A;    Social History   Social History  . Marital Status: Married    Spouse Name: N/A  . Number of Children: N/A  . Years of Education: N/A   Occupational History  . Not on file.   Social History Main Topics  . Smoking status: Never Smoker   . Smokeless tobacco: Never Used  . Alcohol Use: No  . Drug Use: No  . Sexual Activity: No   Other Topics Concern  . Not on file   Social History Narrative   Lives with spouse and mother. Retired Network engineer, now housewife    Current Outpatient Prescriptions on File Prior to Visit  Medication Sig Dispense Refill  . aspirin EC 81 MG tablet Take 1 tablet (81 mg total) by mouth daily.    . B Complex-C (B-COMPLEX WITH VITAMIN C) tablet Take 1 tablet by mouth daily.    . carvedilol (COREG) 25 MG tablet TAKE 1 TABLET BY MOUTH TWICE DAILY WITH FOOD 60 tablet 5  . Cholecalciferol (VITAMIN D3) 2000 units capsule Take 1 capsule (2,000 Units total) by mouth daily. 90 capsule 3  . citalopram (CELEXA) 20 MG tablet Take 1 tablet (20 mg total) by  mouth daily. 30 tablet 3  . clotrimazole (MYCELEX) 10 MG troche Take 1 tablet (10 mg total) by mouth 5 (five) times daily. 140 tablet 2  . Coenzyme Q10 200 MG capsule Take 200 mg by mouth daily.    . diclofenac sodium (VOLTAREN) 1 % GEL Apply 2 g topically daily as needed (knee pain). 10 g 3  . Dietary Management Product (VASCULERA) TABS Take 1 capsule by mouth daily. 30 tablet 11  . DULoxetine (CYMBALTA) 60 MG capsule Take 60 mg by mouth daily.    [name] ezetimibe (ZETIA) 10 MG tablet Take 1 tablet (10 mg total) by mouth daily. 90 tablet 3  . fenofibrate 160 MG tablet TAKE 1 TABLET (160 MG TOTAL) BY MOUTH DAILY. MUST ESTABLISH WITH NEW PCP FOR ADDITIONAL REFILLS. 90 tablet 3  . gabapentin (NEURONTIN) 300 MG capsule TAKE 2 CAPSULES (600 MG TOTAL) BY MOUTH 4 (FOUR) TIMES DAILY. 240 capsule 0  . hydrALAZINE (APRESOLINE) 25 MG tablet TAKE 1 TABLET (25 MG TOTAL) BY MOUTH 3 (THREE) TIMES  DAILY. 90 tablet 2  . ketoconazole (NIZORAL) 2 % cream Apply 1 application topically daily. Reported on 03/19/2015    . lactobacillus (FLORANEX/LACTINEX) PACK Take 1 packet (1 g total) by mouth 3 (three) times daily with meals. 30 packet 0  . Magnesium 500 MG TABS Take 500 mg by mouth daily.    . metolazone (ZAROXOLYN) 5 MG tablet TAKE 5mg  (1 tablet) Saturday.  Can take an additional as needed for wt gain 3lbs overnight or 5 lbs within one week. 15 tablet 3  . nystatin-triamcinolone ointment (MYCOLOG) Apply 1 application topically 2 (two) times daily. 60 g 2  . omega-3 acid ethyl esters (LOVAZA) 1 g capsule Take 2 capsules (2 g total) by mouth 2 (two) times daily. 270 capsule 3  . potassium chloride 20 MEQ TBCR Take 40 mEq by mouth 2 (two) times daily. With an additional 40 on Sat 145 tablet 6  . rOPINIRole (REQUIP) 2 MG tablet Take 1 tablet (2 mg total) by mouth 3 (three) times daily as needed. 270 tablet 3  . spironolactone (ALDACTONE) 25 MG tablet TAKE 1 TABLET BY MOUTH DAILY 30 tablet 2  . torsemide (DEMADEX) 100 MG tablet TAKE 1 TABLET (100 MG TOTAL) BY MOUTH 2 (TWO) TIMES DAILY. 60 tablet 5  . triamcinolone cream (KENALOG) 0.1 % Apply 1 application topically 2 (two) times daily as needed (rash). Reported on 05/04/2015    . vitamin B-12 (CYANOCOBALAMIN) 500 MCG tablet Take 500 mcg by mouth daily. Reported on 03/19/2015     No current facility-administered medications on file prior to visit.    Allergies  Allergen Reactions  . Cymbalta [Duloxetine Hcl] Other (See Comments)    Restless leg syndrome  . Statins Other (See Comments)    Statin drugs cause muscle pain / "muscle damage"--was told by MD not to take  . Sulfa Antibiotics Diarrhea  . Levemir [Insulin Detemir] Itching  . Morphine Other (See Comments)    GI upset and headaches  . Zinc Swelling and Rash    Family History  Problem Relation Age of Onset  . Heart disease Mother   . Heart disease Father   . Heart disease       family history  . Stomach cancer Paternal Grandmother   . Lung cancer Paternal Grandfather   . Colon cancer Neg Hx   . CVA      several aunts  . Heart attack Mother 19  . Heart attack  Father 4  . Heart attack      several aunts and an uncle    BP 108/60 mmHg  Pulse 66  Temp(Src) 97.9 F (36.6 C) (Oral)  Wt 250 lb 8 oz (113.626 kg)  SpO2 93%  Review of Systems She has lost a few lbs.     Objective:   Physical Exam VITAL SIGNS:  See vs page. GENERAL: no distress. In wheelchair Pulses: dorsalis pedis intact bilat.   MSK: no deformity of the feet  CV: 2+ bilat leg edema  Skin: no ulcer on the feet, but the skin is dry. normal temp on the feet. The legs have bilat spotty hyperpigmentation, and both feet have erythema when held dependent.  Neuro: sensation is intact to touch on the feet, but decreased from normal.  Ext: There is bilateral onychomycosis of the toenails, and heavy calluses.      Lab Results  Component Value Date   HGBA1C 12.4 07/10/2015      Assessment & Plan:  DM: worse for uncertain reason.   Patient is advised the following: Patient Instructions  check your blood sugar twice a day.  vary the time of day when you check, between before the 3 meals, and at bedtime.  also check if you have symptoms of your blood sugar being too high or too low.  please keep a record of the readings and bring it to your next appointment here.  You can write it on any piece of paper.  please call us sooner if your blood sugar goes below 70, or if you have a lot of readings over 200.  Please increase the insulin to 220 units each morning.   Please call us next week, to tell us how the blood sugar is doing.   Please come back for a follow-up appointment in 2 months.

## 2015-07-10 NOTE — Assessment & Plan Note (Addendum)
This is a new problem. Left foot pain with concern for possible fracture given mechanism and current symptoms. Treat conservatively with ice and supportive shoes. Obtain x-ray. Follow up pending results of x-ray or if symptoms worsen or do not improve.

## 2015-07-10 NOTE — Patient Instructions (Addendum)
Thank you for choosing [facility].  Summary/Instructions:   Stop taking the hydralazine.  Continue to monitor your weight.  Start with 1/2 of a pain pill and avoid if you continue to feel dizzy.  Your prescription(s) have been submitted to your pharmacy or been printed and provided for you. Please take as directed and contact our office if you believe you are having problem(s) with the medication(s) or have any questions.  Please stop by the lab on the basement level of the building for your blood work. Your results will be released to [facility] (or called to you) after review, usually within 72 hours after test completion. If any changes need to be made, you will be notified at that same time.  Please stop by radiology on the basement level of the building for your x-rays. Your results will be released to [facility] (or called to you) after review, usually within 72 hours after test completion. If any treatments or changes are necessary, you will be notified at that same time.  If your symptoms worsen or fail to improve, please contact our office for further instruction, or in case of emergency go directly to the emergency room at the closest medical facility.

## 2015-07-12 ENCOUNTER — Encounter: Payer: Self-pay | Admitting: Family

## 2015-07-14 ENCOUNTER — Inpatient Hospital Stay (HOSPITAL_COMMUNITY): Admission: RE | Admit: 2015-07-14 | Payer: PPO | Source: Ambulatory Visit

## 2015-07-16 DIAGNOSIS — Z9981 Dependence on supplemental oxygen: Secondary | ICD-10-CM | POA: Diagnosis not present

## 2015-07-16 DIAGNOSIS — E1122 Type 2 diabetes mellitus with diabetic chronic kidney disease: Secondary | ICD-10-CM | POA: Diagnosis not present

## 2015-07-16 DIAGNOSIS — L03115 Cellulitis of right lower limb: Secondary | ICD-10-CM | POA: Diagnosis not present

## 2015-07-16 DIAGNOSIS — E662 Morbid (severe) obesity with alveolar hypoventilation: Secondary | ICD-10-CM | POA: Diagnosis not present

## 2015-07-16 DIAGNOSIS — N183 Chronic kidney disease, stage 3 (moderate): Secondary | ICD-10-CM | POA: Diagnosis not present

## 2015-07-16 DIAGNOSIS — Z87440 Personal history of urinary (tract) infections: Secondary | ICD-10-CM | POA: Diagnosis not present

## 2015-07-16 DIAGNOSIS — I5042 Chronic combined systolic (congestive) and diastolic (congestive) heart failure: Secondary | ICD-10-CM | POA: Diagnosis not present

## 2015-07-16 DIAGNOSIS — M47816 Spondylosis without myelopathy or radiculopathy, lumbar region: Secondary | ICD-10-CM | POA: Diagnosis not present

## 2015-07-16 DIAGNOSIS — G2581 Restless legs syndrome: Secondary | ICD-10-CM | POA: Diagnosis not present

## 2015-07-16 DIAGNOSIS — Z7982 Long term (current) use of aspirin: Secondary | ICD-10-CM | POA: Diagnosis not present

## 2015-07-16 DIAGNOSIS — L03116 Cellulitis of left lower limb: Secondary | ICD-10-CM | POA: Diagnosis not present

## 2015-07-16 DIAGNOSIS — M170 Bilateral primary osteoarthritis of knee: Secondary | ICD-10-CM | POA: Diagnosis not present

## 2015-07-16 DIAGNOSIS — Z7901 Long term (current) use of anticoagulants: Secondary | ICD-10-CM | POA: Diagnosis not present

## 2015-07-16 DIAGNOSIS — E785 Hyperlipidemia, unspecified: Secondary | ICD-10-CM | POA: Diagnosis not present

## 2015-07-16 DIAGNOSIS — I130 Hypertensive heart and chronic kidney disease with heart failure and stage 1 through stage 4 chronic kidney disease, or unspecified chronic kidney disease: Secondary | ICD-10-CM | POA: Diagnosis not present

## 2015-07-16 DIAGNOSIS — E1142 Type 2 diabetes mellitus with diabetic polyneuropathy: Secondary | ICD-10-CM | POA: Diagnosis not present

## 2015-07-16 DIAGNOSIS — G4733 Obstructive sleep apnea (adult) (pediatric): Secondary | ICD-10-CM | POA: Diagnosis not present

## 2015-07-16 DIAGNOSIS — Z794 Long term (current) use of insulin: Secondary | ICD-10-CM | POA: Diagnosis not present

## 2015-07-16 DIAGNOSIS — Z5181 Encounter for therapeutic drug level monitoring: Secondary | ICD-10-CM | POA: Diagnosis not present

## 2015-07-18 DIAGNOSIS — J9690 Respiratory failure, unspecified, unspecified whether with hypoxia or hypercapnia: Secondary | ICD-10-CM | POA: Diagnosis not present

## 2015-07-18 DIAGNOSIS — G4733 Obstructive sleep apnea (adult) (pediatric): Secondary | ICD-10-CM | POA: Diagnosis not present

## 2015-07-18 DIAGNOSIS — R0602 Shortness of breath: Secondary | ICD-10-CM | POA: Diagnosis not present

## 2015-07-18 DIAGNOSIS — I509 Heart failure, unspecified: Secondary | ICD-10-CM | POA: Diagnosis not present

## 2015-07-23 ENCOUNTER — Telehealth (HOSPITAL_COMMUNITY): Payer: Self-pay | Admitting: *Deleted

## 2015-07-23 DIAGNOSIS — G2581 Restless legs syndrome: Secondary | ICD-10-CM | POA: Diagnosis not present

## 2015-07-23 DIAGNOSIS — Z7982 Long term (current) use of aspirin: Secondary | ICD-10-CM | POA: Diagnosis not present

## 2015-07-23 DIAGNOSIS — E662 Morbid (severe) obesity with alveolar hypoventilation: Secondary | ICD-10-CM | POA: Diagnosis not present

## 2015-07-23 DIAGNOSIS — L03115 Cellulitis of right lower limb: Secondary | ICD-10-CM | POA: Diagnosis not present

## 2015-07-23 DIAGNOSIS — M47816 Spondylosis without myelopathy or radiculopathy, lumbar region: Secondary | ICD-10-CM | POA: Diagnosis not present

## 2015-07-23 DIAGNOSIS — Z7901 Long term (current) use of anticoagulants: Secondary | ICD-10-CM | POA: Diagnosis not present

## 2015-07-23 DIAGNOSIS — Z794 Long term (current) use of insulin: Secondary | ICD-10-CM | POA: Diagnosis not present

## 2015-07-23 DIAGNOSIS — M170 Bilateral primary osteoarthritis of knee: Secondary | ICD-10-CM | POA: Diagnosis not present

## 2015-07-23 DIAGNOSIS — E785 Hyperlipidemia, unspecified: Secondary | ICD-10-CM | POA: Diagnosis not present

## 2015-07-23 DIAGNOSIS — N183 Chronic kidney disease, stage 3 (moderate): Secondary | ICD-10-CM | POA: Diagnosis not present

## 2015-07-23 DIAGNOSIS — Z87440 Personal history of urinary (tract) infections: Secondary | ICD-10-CM | POA: Diagnosis not present

## 2015-07-23 DIAGNOSIS — I130 Hypertensive heart and chronic kidney disease with heart failure and stage 1 through stage 4 chronic kidney disease, or unspecified chronic kidney disease: Secondary | ICD-10-CM | POA: Diagnosis not present

## 2015-07-23 DIAGNOSIS — G4733 Obstructive sleep apnea (adult) (pediatric): Secondary | ICD-10-CM | POA: Diagnosis not present

## 2015-07-23 DIAGNOSIS — E1142 Type 2 diabetes mellitus with diabetic polyneuropathy: Secondary | ICD-10-CM | POA: Diagnosis not present

## 2015-07-23 DIAGNOSIS — E1122 Type 2 diabetes mellitus with diabetic chronic kidney disease: Secondary | ICD-10-CM | POA: Diagnosis not present

## 2015-07-23 DIAGNOSIS — L03116 Cellulitis of left lower limb: Secondary | ICD-10-CM | POA: Diagnosis not present

## 2015-07-23 DIAGNOSIS — I5042 Chronic combined systolic (congestive) and diastolic (congestive) heart failure: Secondary | ICD-10-CM | POA: Diagnosis not present

## 2015-07-23 DIAGNOSIS — Z5181 Encounter for therapeutic drug level monitoring: Secondary | ICD-10-CM | POA: Diagnosis not present

## 2015-07-23 DIAGNOSIS — Z9981 Dependence on supplemental oxygen: Secondary | ICD-10-CM | POA: Diagnosis not present

## 2015-07-23 NOTE — Telephone Encounter (Signed)
Beth, RN called concerned about pt, she states her wt is up about 3 lbs, she was 152 last week, usually runs around 150-151 and today she is up to 155 lbs.  She reports pt saw pcp last week and was told her stop hydralazine.  Upon review of chart pcp states:    Dizziness - Golden Circle, FNP at 07/10/2015 9:12 PM     Status: Written Related Problem: Dizziness   Expand All Collapse All   Dizziness with concern for lower blood pressure. Discontinue/hold hydralazine. Encourage to increase fluid intake to reduce risk of dehydration. Reduce pain medication and use as sparingly to reduce risk increased dizziness. Obtain CMET and CBC. Follow up pending results or if symptoms worsen.        So pt has been drinking extra fluid.  She does have increased swelling to LE bilat.  Pt's BP today was 120s.  Advised pt can take metolazone today and continue to monitor wt, decrease fluid intake to less than 2 L daily, Beth will give pt instructions and again reinforce fluid restrictions.

## 2015-07-24 ENCOUNTER — Telehealth: Payer: Self-pay | Admitting: Internal Medicine

## 2015-07-24 NOTE — Telephone Encounter (Signed)
[name] called from [place], they are seeing Melissa Myers for home palliative care, request verbal order to continue to see her every 60 days. Please call her back

## 2015-07-27 NOTE — Telephone Encounter (Signed)
Left message giving verbal orders for care every 60 days.

## 2015-07-28 ENCOUNTER — Telehealth: Payer: Self-pay

## 2015-07-28 ENCOUNTER — Other Ambulatory Visit (HOSPITAL_COMMUNITY): Payer: PPO

## 2015-07-28 NOTE — Telephone Encounter (Signed)
Ok Please continue the same insulin for now i'll see you next time.

## 2015-07-28 NOTE — Telephone Encounter (Signed)
Di Kindle with [facility] home health called to report blood sugar readings for the pt.  07/27/2015: Fasting 109 Before lunch: 201  Before Supper: 160  07/28/2015: Fasting: 56 130 pm: 469 135 pm: 254 140 pm: 201  Pt has been staking 220 units of novolin N daily but, she has not taken any insulin today because of the low blood sugar reading this AM. Di Kindle stated the pt has been lethargic and quite today.  Please advise, Thanks! Thanks!

## 2015-07-28 NOTE — Telephone Encounter (Signed)
I contacted the pt and advised of note below and voiced understanding.  

## 2015-07-28 NOTE — Telephone Encounter (Signed)
The next step is to change to "70/30" insulin, which is 70% NPH (like you take now), and 30% regular (which works faster than the NPH.  Tho goal is to have more insulin working during the day, and less overnight.  If we change, you should take a lower dosage at first, to be safe.  Ok with you to change?

## 2015-07-28 NOTE — Telephone Encounter (Signed)
I contacted the pt and advised of note below. Pt stated she would not like to change insulin at this time because she just renewed her insulin and has 7 vials of insulin left. Please advise, Thanks!

## 2015-07-30 DIAGNOSIS — Z87440 Personal history of urinary (tract) infections: Secondary | ICD-10-CM | POA: Diagnosis not present

## 2015-07-30 DIAGNOSIS — E662 Morbid (severe) obesity with alveolar hypoventilation: Secondary | ICD-10-CM | POA: Diagnosis not present

## 2015-07-30 DIAGNOSIS — Z5181 Encounter for therapeutic drug level monitoring: Secondary | ICD-10-CM | POA: Diagnosis not present

## 2015-07-30 DIAGNOSIS — Z9981 Dependence on supplemental oxygen: Secondary | ICD-10-CM | POA: Diagnosis not present

## 2015-07-30 DIAGNOSIS — M47816 Spondylosis without myelopathy or radiculopathy, lumbar region: Secondary | ICD-10-CM | POA: Diagnosis not present

## 2015-07-30 DIAGNOSIS — E1142 Type 2 diabetes mellitus with diabetic polyneuropathy: Secondary | ICD-10-CM | POA: Diagnosis not present

## 2015-07-30 DIAGNOSIS — N183 Chronic kidney disease, stage 3 (moderate): Secondary | ICD-10-CM | POA: Diagnosis not present

## 2015-07-30 DIAGNOSIS — M170 Bilateral primary osteoarthritis of knee: Secondary | ICD-10-CM | POA: Diagnosis not present

## 2015-07-30 DIAGNOSIS — L03116 Cellulitis of left lower limb: Secondary | ICD-10-CM | POA: Diagnosis not present

## 2015-07-30 DIAGNOSIS — E1122 Type 2 diabetes mellitus with diabetic chronic kidney disease: Secondary | ICD-10-CM | POA: Diagnosis not present

## 2015-07-30 DIAGNOSIS — Z7982 Long term (current) use of aspirin: Secondary | ICD-10-CM | POA: Diagnosis not present

## 2015-07-30 DIAGNOSIS — Z794 Long term (current) use of insulin: Secondary | ICD-10-CM | POA: Diagnosis not present

## 2015-07-30 DIAGNOSIS — L03115 Cellulitis of right lower limb: Secondary | ICD-10-CM | POA: Diagnosis not present

## 2015-07-30 DIAGNOSIS — I130 Hypertensive heart and chronic kidney disease with heart failure and stage 1 through stage 4 chronic kidney disease, or unspecified chronic kidney disease: Secondary | ICD-10-CM | POA: Diagnosis not present

## 2015-07-30 DIAGNOSIS — G2581 Restless legs syndrome: Secondary | ICD-10-CM | POA: Diagnosis not present

## 2015-07-30 DIAGNOSIS — E785 Hyperlipidemia, unspecified: Secondary | ICD-10-CM | POA: Diagnosis not present

## 2015-07-30 DIAGNOSIS — I5042 Chronic combined systolic (congestive) and diastolic (congestive) heart failure: Secondary | ICD-10-CM | POA: Diagnosis not present

## 2015-07-30 DIAGNOSIS — Z7901 Long term (current) use of anticoagulants: Secondary | ICD-10-CM | POA: Diagnosis not present

## 2015-07-30 DIAGNOSIS — G4733 Obstructive sleep apnea (adult) (pediatric): Secondary | ICD-10-CM | POA: Diagnosis not present

## 2015-08-03 ENCOUNTER — Ambulatory Visit (HOSPITAL_COMMUNITY)
Admission: RE | Admit: 2015-08-03 | Discharge: 2015-08-03 | Disposition: A | Discharge status: Home / Self Care | Payer: PPO | Source: Ambulatory Visit | Attending: Cardiovascular Disease | Admitting: Cardiovascular Disease

## 2015-08-03 ENCOUNTER — Other Ambulatory Visit (HOSPITAL_COMMUNITY): Payer: Self-pay | Admitting: Cardiology

## 2015-08-03 DIAGNOSIS — E785 Hyperlipidemia, unspecified: Secondary | ICD-10-CM | POA: Diagnosis not present

## 2015-08-03 DIAGNOSIS — I110 Hypertensive heart disease with heart failure: Secondary | ICD-10-CM | POA: Insufficient documentation

## 2015-08-03 DIAGNOSIS — I5032 Chronic diastolic (congestive) heart failure: Secondary | ICD-10-CM | POA: Insufficient documentation

## 2015-08-03 DIAGNOSIS — R0989 Other specified symptoms and signs involving the circulatory and respiratory systems: Secondary | ICD-10-CM

## 2015-08-03 DIAGNOSIS — K219 Gastro-esophageal reflux disease without esophagitis: Secondary | ICD-10-CM | POA: Insufficient documentation

## 2015-08-03 DIAGNOSIS — G4733 Obstructive sleep apnea (adult) (pediatric): Secondary | ICD-10-CM | POA: Diagnosis not present

## 2015-08-03 DIAGNOSIS — E1142 Type 2 diabetes mellitus with diabetic polyneuropathy: Secondary | ICD-10-CM | POA: Insufficient documentation

## 2015-08-04 ENCOUNTER — Ambulatory Visit: Payer: PPO | Admitting: Internal Medicine

## 2015-08-04 DIAGNOSIS — Z0289 Encounter for other administrative examinations: Secondary | ICD-10-CM

## 2015-08-06 DIAGNOSIS — G2581 Restless legs syndrome: Secondary | ICD-10-CM | POA: Diagnosis not present

## 2015-08-06 DIAGNOSIS — G4733 Obstructive sleep apnea (adult) (pediatric): Secondary | ICD-10-CM | POA: Diagnosis not present

## 2015-08-06 DIAGNOSIS — M47816 Spondylosis without myelopathy or radiculopathy, lumbar region: Secondary | ICD-10-CM | POA: Diagnosis not present

## 2015-08-06 DIAGNOSIS — E662 Morbid (severe) obesity with alveolar hypoventilation: Secondary | ICD-10-CM | POA: Diagnosis not present

## 2015-08-06 DIAGNOSIS — L03116 Cellulitis of left lower limb: Secondary | ICD-10-CM | POA: Diagnosis not present

## 2015-08-06 DIAGNOSIS — M170 Bilateral primary osteoarthritis of knee: Secondary | ICD-10-CM | POA: Diagnosis not present

## 2015-08-06 DIAGNOSIS — Z7982 Long term (current) use of aspirin: Secondary | ICD-10-CM | POA: Diagnosis not present

## 2015-08-06 DIAGNOSIS — L03115 Cellulitis of right lower limb: Secondary | ICD-10-CM | POA: Diagnosis not present

## 2015-08-06 DIAGNOSIS — N183 Chronic kidney disease, stage 3 (moderate): Secondary | ICD-10-CM | POA: Diagnosis not present

## 2015-08-06 DIAGNOSIS — I5042 Chronic combined systolic (congestive) and diastolic (congestive) heart failure: Secondary | ICD-10-CM | POA: Diagnosis not present

## 2015-08-06 DIAGNOSIS — E785 Hyperlipidemia, unspecified: Secondary | ICD-10-CM | POA: Diagnosis not present

## 2015-08-06 DIAGNOSIS — Z794 Long term (current) use of insulin: Secondary | ICD-10-CM | POA: Diagnosis not present

## 2015-08-06 DIAGNOSIS — Z5181 Encounter for therapeutic drug level monitoring: Secondary | ICD-10-CM | POA: Diagnosis not present

## 2015-08-06 DIAGNOSIS — I130 Hypertensive heart and chronic kidney disease with heart failure and stage 1 through stage 4 chronic kidney disease, or unspecified chronic kidney disease: Secondary | ICD-10-CM | POA: Diagnosis not present

## 2015-08-06 DIAGNOSIS — Z9981 Dependence on supplemental oxygen: Secondary | ICD-10-CM | POA: Diagnosis not present

## 2015-08-06 DIAGNOSIS — Z7901 Long term (current) use of anticoagulants: Secondary | ICD-10-CM | POA: Diagnosis not present

## 2015-08-06 DIAGNOSIS — E1142 Type 2 diabetes mellitus with diabetic polyneuropathy: Secondary | ICD-10-CM | POA: Diagnosis not present

## 2015-08-06 DIAGNOSIS — E1122 Type 2 diabetes mellitus with diabetic chronic kidney disease: Secondary | ICD-10-CM | POA: Diagnosis not present

## 2015-08-06 DIAGNOSIS — Z87440 Personal history of urinary (tract) infections: Secondary | ICD-10-CM | POA: Diagnosis not present

## 2015-08-12 ENCOUNTER — Other Ambulatory Visit: Payer: Self-pay | Admitting: *Deleted

## 2015-08-12 ENCOUNTER — Inpatient Hospital Stay (HOSPITAL_COMMUNITY): Admission: RE | Admit: 2015-08-12 | Payer: PPO | Source: Ambulatory Visit

## 2015-08-12 MED ORDER — EZETIMIBE 10 MG PO TABS: 10.0000 mg | ORAL_TABLET | Freq: Every day | ORAL | Status: DC

## 2015-08-13 DIAGNOSIS — Z5181 Encounter for therapeutic drug level monitoring: Secondary | ICD-10-CM | POA: Diagnosis not present

## 2015-08-13 DIAGNOSIS — Z7982 Long term (current) use of aspirin: Secondary | ICD-10-CM | POA: Diagnosis not present

## 2015-08-13 DIAGNOSIS — I130 Hypertensive heart and chronic kidney disease with heart failure and stage 1 through stage 4 chronic kidney disease, or unspecified chronic kidney disease: Secondary | ICD-10-CM | POA: Diagnosis not present

## 2015-08-13 DIAGNOSIS — L03116 Cellulitis of left lower limb: Secondary | ICD-10-CM | POA: Diagnosis not present

## 2015-08-13 DIAGNOSIS — Z9981 Dependence on supplemental oxygen: Secondary | ICD-10-CM | POA: Diagnosis not present

## 2015-08-13 DIAGNOSIS — L03115 Cellulitis of right lower limb: Secondary | ICD-10-CM | POA: Diagnosis not present

## 2015-08-13 DIAGNOSIS — Z87440 Personal history of urinary (tract) infections: Secondary | ICD-10-CM | POA: Diagnosis not present

## 2015-08-13 DIAGNOSIS — E1142 Type 2 diabetes mellitus with diabetic polyneuropathy: Secondary | ICD-10-CM | POA: Diagnosis not present

## 2015-08-13 DIAGNOSIS — M47816 Spondylosis without myelopathy or radiculopathy, lumbar region: Secondary | ICD-10-CM | POA: Diagnosis not present

## 2015-08-13 DIAGNOSIS — M170 Bilateral primary osteoarthritis of knee: Secondary | ICD-10-CM | POA: Diagnosis not present

## 2015-08-13 DIAGNOSIS — E662 Morbid (severe) obesity with alveolar hypoventilation: Secondary | ICD-10-CM | POA: Diagnosis not present

## 2015-08-13 DIAGNOSIS — G4733 Obstructive sleep apnea (adult) (pediatric): Secondary | ICD-10-CM | POA: Diagnosis not present

## 2015-08-13 DIAGNOSIS — E785 Hyperlipidemia, unspecified: Secondary | ICD-10-CM | POA: Diagnosis not present

## 2015-08-13 DIAGNOSIS — I5042 Chronic combined systolic (congestive) and diastolic (congestive) heart failure: Secondary | ICD-10-CM | POA: Diagnosis not present

## 2015-08-13 DIAGNOSIS — E1122 Type 2 diabetes mellitus with diabetic chronic kidney disease: Secondary | ICD-10-CM | POA: Diagnosis not present

## 2015-08-13 DIAGNOSIS — G2581 Restless legs syndrome: Secondary | ICD-10-CM | POA: Diagnosis not present

## 2015-08-13 DIAGNOSIS — N183 Chronic kidney disease, stage 3 (moderate): Secondary | ICD-10-CM | POA: Diagnosis not present

## 2015-08-13 DIAGNOSIS — Z7901 Long term (current) use of anticoagulants: Secondary | ICD-10-CM | POA: Diagnosis not present

## 2015-08-13 DIAGNOSIS — Z794 Long term (current) use of insulin: Secondary | ICD-10-CM | POA: Diagnosis not present

## 2015-08-14 ENCOUNTER — Other Ambulatory Visit (HOSPITAL_COMMUNITY): Payer: Self-pay | Admitting: Student

## 2015-08-17 ENCOUNTER — Ambulatory Visit (INDEPENDENT_AMBULATORY_CARE_PROVIDER_SITE_OTHER): Payer: PPO | Admitting: Internal Medicine

## 2015-08-17 ENCOUNTER — Encounter: Payer: Self-pay | Admitting: Internal Medicine

## 2015-08-17 VITALS — BP 104/50 | HR 66 | Temp 98.5°F | Resp 14 | Ht <= 58 in

## 2015-08-17 DIAGNOSIS — R300 Dysuria: Secondary | ICD-10-CM | POA: Diagnosis not present

## 2015-08-17 DIAGNOSIS — G894 Chronic pain syndrome: Secondary | ICD-10-CM | POA: Diagnosis not present

## 2015-08-17 DIAGNOSIS — R350 Frequency of micturition: Secondary | ICD-10-CM

## 2015-08-17 DIAGNOSIS — Z79891 Long term (current) use of opiate analgesic: Secondary | ICD-10-CM | POA: Diagnosis not present

## 2015-08-17 DIAGNOSIS — M79672 Pain in left foot: Secondary | ICD-10-CM | POA: Diagnosis not present

## 2015-08-17 LAB — POCT URINALYSIS DIPSTICK
Bilirubin, UA: NEGATIVE
Blood, UA: NEGATIVE
GLUCOSE UA: NEGATIVE
Ketones, UA: NEGATIVE
NITRITE UA: NEGATIVE
Protein, UA: NEGATIVE
Spec Grav, UA: 1.02
UROBILINOGEN UA: NEGATIVE
pH, UA: 6

## 2015-08-17 MED ORDER — NITROFURANTOIN MONOHYD MACRO 100 MG PO CAPS: 100.0000 mg | ORAL_CAPSULE | Freq: Two times a day (BID) | ORAL | Status: DC

## 2015-08-17 MED ORDER — HYDROCODONE-ACETAMINOPHEN 7.5-325 MG PO TABS: 1.0000 | ORAL_TABLET | Freq: Four times a day (QID) | ORAL | Status: DC | PRN

## 2015-08-17 NOTE — Progress Notes (Signed)
Pre visit review using our clinic review tool, if applicable. No additional management support is needed unless otherwise documented below in the visit note. 

## 2015-08-17 NOTE — Patient Instructions (Addendum)
We have checked the urine today for infection and will call you back with the results.   We have refilled the hydrocodone today.

## 2015-08-18 ENCOUNTER — Ambulatory Visit: Payer: PPO | Admitting: Internal Medicine

## 2015-08-18 DIAGNOSIS — J9690 Respiratory failure, unspecified, unspecified whether with hypoxia or hypercapnia: Secondary | ICD-10-CM | POA: Diagnosis not present

## 2015-08-18 DIAGNOSIS — R0602 Shortness of breath: Secondary | ICD-10-CM | POA: Diagnosis not present

## 2015-08-18 DIAGNOSIS — I509 Heart failure, unspecified: Secondary | ICD-10-CM | POA: Diagnosis not present

## 2015-08-18 DIAGNOSIS — G4733 Obstructive sleep apnea (adult) (pediatric): Secondary | ICD-10-CM | POA: Diagnosis not present

## 2015-08-18 NOTE — Assessment & Plan Note (Signed)
poc u/a done in the office and with signs of infection. Treating with macrobid for 1 week. The worsening incontinence could be a symptom of infection.

## 2015-08-18 NOTE — Progress Notes (Signed)
   Subjective:    Patient ID: Melissa Myers, female    DOB: 21-Jun-1945, 70 y.o.   MRN: [id number]  HPI The patient is a 70 YO female coming in for follow up of her foot pain s/p several falls in May. She denies recent fall. Her feet are more swollen still since that time. She is not elevating them at home. She does not have compression stockings. She also is having more incontinence and frequency and wants to have her urine checked. Denies burning. No stomach pain different than usual.   Review of Systems  Constitutional: Positive for activity change. Negative for fever, appetite change, fatigue and unexpected weight change.  Eyes: Negative.   Respiratory: Negative for cough, chest tightness, shortness of breath and wheezing.   Cardiovascular: Positive for leg swelling. Negative for chest pain and palpitations.  Gastrointestinal: Negative for nausea, abdominal pain, diarrhea, constipation and abdominal distention.  Genitourinary: Positive for frequency. Negative for dysuria, urgency, hematuria and flank pain.       Incontinence  Musculoskeletal: Positive for myalgias, arthralgias and gait problem.  Skin: Positive for rash and wound.  Neurological: Positive for weakness and numbness. Negative for dizziness and light-headedness.  Psychiatric/Behavioral: Negative.       Objective:   Physical Exam  Constitutional: She is oriented to person, place, and time. She appears well-developed and well-nourished.  HENT:  Head: Normocephalic and atraumatic.  Eyes: EOM are normal.  Neck: Normal range of motion.  Cardiovascular: Normal rate and regular rhythm.   Pulmonary/Chest: Effort normal and breath sounds normal. No respiratory distress. She has no wheezes. She has no rales.  Abdominal: Soft. Bowel sounds are normal. She exhibits no distension. There is no tenderness. There is no rebound.  Musculoskeletal: She exhibits edema.  1-2+ edema bilateral legs which is worsened from prior, swelling  in the feet with callusing with injury on the right great toe, no signs of infection. No cellulitis appearing.   Neurological: She is alert and oriented to person, place, and time. Coordination normal.  Skin: Skin is warm and dry. Rash noted.  Some redness of both feet and chronic venous stasis color changes on the legs.    Filed Vitals:   08/17/15 1454  BP: 104/50  Pulse: 66  Temp: 98.5 F (36.9 C)  TempSrc: Oral  Resp: 14  Height: 4\' 10"  (1.473 m)  SpO2: 94%      Assessment & Plan:

## 2015-08-18 NOTE — Assessment & Plan Note (Signed)
Refill her hydrocodone today, UDS collected at office visit.

## 2015-08-18 NOTE — Assessment & Plan Note (Signed)
Still with swelling consistent with prior exam in May. We did discuss elevating the legs today. Weight overall is stable and likely fluid pill does not need adjustment. No signs of cellulitis at this time and no signs of systemic illness. There is an opening on the right foot from podiatric procedure done to remove a callus which does not appear infected.

## 2015-08-19 ENCOUNTER — Other Ambulatory Visit: Payer: Self-pay

## 2015-08-19 MED ORDER — GABAPENTIN 300 MG PO CAPS: ORAL_CAPSULE | ORAL | Status: DC

## 2015-08-19 NOTE — Telephone Encounter (Signed)
I am not prescriber for this rx

## 2015-08-19 NOTE — Telephone Encounter (Signed)
We have been filling this medicine as a courtesy, and request further refills be per PCP.   Not a cardiac med.   Legrand Como [address] [place], [place] 08/19/2015 3:16 PM

## 2015-08-19 NOTE — Telephone Encounter (Signed)
Pt is requesting a refill, last filled on 05/28/15 #240. Last OV 07/10/2015, ok to refill?

## 2015-08-20 DIAGNOSIS — M47816 Spondylosis without myelopathy or radiculopathy, lumbar region: Secondary | ICD-10-CM | POA: Diagnosis not present

## 2015-08-20 DIAGNOSIS — G2581 Restless legs syndrome: Secondary | ICD-10-CM | POA: Diagnosis not present

## 2015-08-20 DIAGNOSIS — N183 Chronic kidney disease, stage 3 (moderate): Secondary | ICD-10-CM | POA: Diagnosis not present

## 2015-08-20 DIAGNOSIS — I5042 Chronic combined systolic (congestive) and diastolic (congestive) heart failure: Secondary | ICD-10-CM | POA: Diagnosis not present

## 2015-08-20 DIAGNOSIS — L03115 Cellulitis of right lower limb: Secondary | ICD-10-CM | POA: Diagnosis not present

## 2015-08-20 DIAGNOSIS — E1122 Type 2 diabetes mellitus with diabetic chronic kidney disease: Secondary | ICD-10-CM | POA: Diagnosis not present

## 2015-08-20 DIAGNOSIS — E1142 Type 2 diabetes mellitus with diabetic polyneuropathy: Secondary | ICD-10-CM | POA: Diagnosis not present

## 2015-08-20 DIAGNOSIS — I130 Hypertensive heart and chronic kidney disease with heart failure and stage 1 through stage 4 chronic kidney disease, or unspecified chronic kidney disease: Secondary | ICD-10-CM | POA: Diagnosis not present

## 2015-08-20 DIAGNOSIS — Z794 Long term (current) use of insulin: Secondary | ICD-10-CM | POA: Diagnosis not present

## 2015-08-20 DIAGNOSIS — E785 Hyperlipidemia, unspecified: Secondary | ICD-10-CM | POA: Diagnosis not present

## 2015-08-20 DIAGNOSIS — E662 Morbid (severe) obesity with alveolar hypoventilation: Secondary | ICD-10-CM | POA: Diagnosis not present

## 2015-08-20 DIAGNOSIS — Z7901 Long term (current) use of anticoagulants: Secondary | ICD-10-CM | POA: Diagnosis not present

## 2015-08-20 DIAGNOSIS — Z7982 Long term (current) use of aspirin: Secondary | ICD-10-CM | POA: Diagnosis not present

## 2015-08-20 DIAGNOSIS — Z5181 Encounter for therapeutic drug level monitoring: Secondary | ICD-10-CM | POA: Diagnosis not present

## 2015-08-20 DIAGNOSIS — L03116 Cellulitis of left lower limb: Secondary | ICD-10-CM | POA: Diagnosis not present

## 2015-08-20 DIAGNOSIS — Z9981 Dependence on supplemental oxygen: Secondary | ICD-10-CM | POA: Diagnosis not present

## 2015-08-20 DIAGNOSIS — Z87440 Personal history of urinary (tract) infections: Secondary | ICD-10-CM | POA: Diagnosis not present

## 2015-08-20 DIAGNOSIS — M170 Bilateral primary osteoarthritis of knee: Secondary | ICD-10-CM | POA: Diagnosis not present

## 2015-08-20 DIAGNOSIS — G4733 Obstructive sleep apnea (adult) (pediatric): Secondary | ICD-10-CM | POA: Diagnosis not present

## 2015-08-21 ENCOUNTER — Encounter (HOSPITAL_COMMUNITY): Payer: PPO

## 2015-08-25 ENCOUNTER — Encounter (HOSPITAL_COMMUNITY): Payer: Self-pay

## 2015-08-25 ENCOUNTER — Emergency Department (HOSPITAL_COMMUNITY)
Admission: EM | Admit: 2015-08-25 | Discharge: 2015-08-25 | Disposition: A | Discharge status: Home / Self Care | Payer: PPO | Attending: Emergency Medicine | Admitting: Emergency Medicine

## 2015-08-25 ENCOUNTER — Emergency Department (HOSPITAL_COMMUNITY): Payer: PPO

## 2015-08-25 DIAGNOSIS — R918 Other nonspecific abnormal finding of lung field: Secondary | ICD-10-CM | POA: Diagnosis not present

## 2015-08-25 DIAGNOSIS — E11649 Type 2 diabetes mellitus with hypoglycemia without coma: Secondary | ICD-10-CM | POA: Insufficient documentation

## 2015-08-25 DIAGNOSIS — I110 Hypertensive heart disease with heart failure: Secondary | ICD-10-CM | POA: Insufficient documentation

## 2015-08-25 DIAGNOSIS — E161 Other hypoglycemia: Secondary | ICD-10-CM | POA: Diagnosis not present

## 2015-08-25 DIAGNOSIS — E162 Hypoglycemia, unspecified: Secondary | ICD-10-CM

## 2015-08-25 DIAGNOSIS — Z79899 Other long term (current) drug therapy: Secondary | ICD-10-CM | POA: Insufficient documentation

## 2015-08-25 DIAGNOSIS — Z7982 Long term (current) use of aspirin: Secondary | ICD-10-CM | POA: Diagnosis not present

## 2015-08-25 DIAGNOSIS — E1165 Type 2 diabetes mellitus with hyperglycemia: Secondary | ICD-10-CM | POA: Diagnosis not present

## 2015-08-25 DIAGNOSIS — E1140 Type 2 diabetes mellitus with diabetic neuropathy, unspecified: Secondary | ICD-10-CM | POA: Insufficient documentation

## 2015-08-25 DIAGNOSIS — Z794 Long term (current) use of insulin: Secondary | ICD-10-CM | POA: Insufficient documentation

## 2015-08-25 DIAGNOSIS — I5030 Unspecified diastolic (congestive) heart failure: Secondary | ICD-10-CM | POA: Diagnosis not present

## 2015-08-25 LAB — CBC WITH DIFFERENTIAL/PLATELET
BASOS ABS: 0 10*3/uL (ref 0.0–0.1)
BASOS PCT: 0 %
EOS ABS: 0.2 10*3/uL (ref 0.0–0.7)
Eosinophils Relative: 2 %
HEMATOCRIT: 38.2 % (ref 36.0–46.0)
HEMOGLOBIN: 12.2 g/dL (ref 12.0–15.0)
Lymphocytes Relative: 25 %
Lymphs Abs: 2.6 10*3/uL (ref 0.7–4.0)
MCH: 26.8 pg (ref 26.0–34.0)
MCHC: 31.9 g/dL (ref 30.0–36.0)
MCV: 84 fL (ref 78.0–100.0)
Monocytes Absolute: 0.9 10*3/uL (ref 0.1–1.0)
Monocytes Relative: 9 %
NEUTROS ABS: 6.5 10*3/uL (ref 1.7–7.7)
NEUTROS PCT: 64 %
Platelets: 284 10*3/uL (ref 150–400)
RBC: 4.55 MIL/uL (ref 3.87–5.11)
RDW: 15.9 % — ABNORMAL HIGH (ref 11.5–15.5)
WBC: 10.2 10*3/uL (ref 4.0–10.5)

## 2015-08-25 LAB — BASIC METABOLIC PANEL
ANION GAP: 11 (ref 5–15)
BUN: 38 mg/dL — AB (ref 6–20)
CO2: 30 mmol/L (ref 22–32)
CREATININE: 1.54 mg/dL — AB (ref 0.44–1.00)
Calcium: 9.4 mg/dL (ref 8.9–10.3)
Chloride: 97 mmol/L — ABNORMAL LOW (ref 101–111)
GFR, EST AFRICAN AMERICAN: 38 mL/min — AB (ref >60)
GFR, EST NON AFRICAN AMERICAN: 33 mL/min — AB (ref >60)
GLUCOSE: 97 mg/dL (ref 65–99)
POTASSIUM: 3.2 mmol/L — AB (ref 3.5–5.1)
SODIUM: 138 mmol/L (ref 135–145)

## 2015-08-25 LAB — CBG MONITORING, ED
GLUCOSE-CAPILLARY: 104 mg/dL — AB (ref 65–99)
GLUCOSE-CAPILLARY: 96 mg/dL (ref 65–99)
Glucose-Capillary: 120 mg/dL — ABNORMAL HIGH (ref 65–99)

## 2015-08-25 NOTE — Discharge Instructions (Signed)
Hypoglycemia Ms. Melissa Myers, see your primary doctor within 3 days to evaluate your blood sugar medication.  If any symptoms worsen, come back to the ED immediately. Thank you. Low blood sugar (hypoglycemia) means that the level of sugar in your blood is lower than it should be. Signs of low blood sugar include:  Getting sweaty.  Feeling hungry.  Feeling dizzy or weak.  Feeling sleepier than normal.  Feeling nervous.  Headaches.  Having a fast heartbeat. Low blood sugar can happen fast and can be an emergency. Your doctor can do tests to check your blood sugar level. You can have low blood sugar and not have diabetes. HOME CARE  Check your blood sugar as told by your doctor. If it is less than 70 mg/dl or as told by your doctor, take 1 of the following:  3 to 4 glucose tablets.   cup clear juice.   cup soda pop, not diet.  1 cup milk.  5 to 6 hard candies.  Recheck blood sugar after 15 minutes. Repeat until it is at the right level.  Eat a snack if it is more than 1 hour until the next meal.  Only take medicine as told by your doctor.  Do not skip meals. Eat on time.  Do not drink alcohol except with meals.  Check your blood glucose before driving.  Check your blood glucose before and after exercise.  Always carry treatment with you, such as glucose pills.  Always wear a medical alert bracelet if you have diabetes. GET HELP RIGHT AWAY IF:   Your blood glucose goes below 70 mg/dl or as told by your doctor, and you:  Are confused.  Are not able to swallow.  Pass out (faint).  You cannot treat yourself. You may need someone to help you.  You have low blood sugar problems often.  You have problems from your medicines.  You are not feeling better after 3 to 4 days.  You have vision changes. MAKE SURE YOU:   Understand these instructions.  Will watch this condition.  Will get help right away if you are not doing well or get worse.   This  information is not intended to replace advice given to you by your health care provider. Make sure you discuss any questions you have with your health care provider.   Document Released: 05/04/2009 Document Revised: 02/28/2014 Document Reviewed: 10/14/2014 Elsevier Interactive Patient Education [employer].

## 2015-08-25 NOTE — ED Notes (Signed)
Patient transported from home by EMS for hypoglycemia.  Initial CBG was 32 after a coke, sandwich, and one dose of D10 CBG trended to 86.  Prescribed O2 at home, has not been taking it.  Initially lightheaded but with O2 this has subsided.  A&Ox4 no Pain noted

## 2015-08-25 NOTE — ED Notes (Signed)
Patient transported to X-ray 

## 2015-08-25 NOTE — ED Provider Notes (Signed)
CSN: [id number]     Arrival date & time 08/25/15  0320 History  By signing my name below, I, [facility], attest that this documentation has been prepared under the direction and in the presence of Everlene Balls, MD. Electronically Signed: Virgel Bouquet, ED Scribe. 08/25/2015. 3:55 AM.   Chief Complaint  Patient presents with  . Hypoglycemia    The history is provided by the patient. No language interpreter was used.   HPI Comments: CHAUNTEL Melissa Myers is a 70 y.o. female brought in by EMS with a hx of DM, HLN, diastolic CHF, chronic diarrhea, and GERD who presents to the Emergency Department complaining of constant, gradually improving, hypoglycemia that occurred shortly PTA. Nurse states that he was told by EMS that the pt was found at home unresponsive with a CBG 32 that elevated to 86 after drinking a Coke, eating a sandwich, and one does of D10.  Pt reports that she was asymptomatic when she went to sleep last night and woke up surrounded by EMS and other people. She notes that she last ate a meal yesterday evening and that she took her DM medications yesterday but does not check her CBGs before taking her insulin. Denies fevers, nausea, vomiting, diarrhea, or any other symptoms currently.  PCP: Dr. Loanne Drilling Past Medical History  Diagnosis Date  . Type II or unspecified type diabetes mellitus without mention of complication, not stated as uncontrolled     insulin dep  . Hyperlipidemia     hx rhabdo on statins  . Hypertension   . Diastolic CHF ([facility])   . Chronic diarrhea     a/w nausea - felt related to IBS  . RLS (restless legs syndrome)   . Osteoarthritis   . Stasis dermatitis   . GERD (gastroesophageal reflux disease)   . On home oxygen therapy     uses oxygen 2 liters min per [place] at night and prn during day  . Anemia   . Neuropathy (H[facility])     feet, toes and fingers  . Disc degeneration, lumbar   . OSA (obstructive sleep apnea)     05/2009 sleep study - refuses CPAP   . Deaf     left side only  . Shortness of breath     chronic  . Cellulitis     LOWER EXTREMITIES   Past Surgical History  Procedure Laterality Date  . Tubal ligation  1980  . Cholecystectomy  1997  . Uterine polyp removal  2008  . Umbilical hernia repair  1995  . Tonsillectomy  1970  . Colonoscopy N/A 12/03/2012    Procedure: COLONOSCOPY;  Surgeon: Lafayette Dragon, MD;  Location: [facility];  Service: Endoscopy;  Laterality: N/A;   Family History  Problem Relation Age of Onset  . Heart disease Mother   . Heart disease Father   . Heart disease      family history  . Stomach cancer Paternal Grandmother   . Lung cancer Paternal Grandfather   . Colon cancer Neg Hx   . CVA      several aunts  . Heart attack Mother 94  . Heart attack Father 8  . Heart attack      several aunts and an uncle   Social History  Substance Use Topics  . Smoking status: Never Smoker   . Smokeless tobacco: Never Used  . Alcohol Use: No   OB History    No data available     Review of Systems A complete  10 system review of systems was obtained and all systems are negative except as noted in the HPI and PMH.    Allergies  Cymbalta; Statins; Sulfa antibiotics; Levemir; Morphine; and Zinc  Home Medications   Prior to Admission medications   Medication Sig Start Date End Date Taking? Authorizing Provider  aspirin EC 81 MG tablet Take 1 tablet (81 mg total) by mouth daily. 07/08/14   Rowe Clack, MD  B Complex-C (B-COMPLEX WITH VITAMIN C) tablet Take 1 tablet by mouth daily.    Historical Provider, MD  carvedilol (COREG) 25 MG tablet TAKE 1 TABLET BY MOUTH TWICE DAILY WITH FOOD 11/06/14   Jolaine Artist, MD  Cholecalciferol (VITAMIN D3) 2000 units capsule Take 1 capsule (2,000 Units total) by mouth daily. 06/17/15   Janith Lima, MD  citalopram (CELEXA) 20 MG tablet Take 1 tablet (20 mg total) by mouth daily. 05/04/15   [name], MD  clotrimazole (MYCELEX) 10 MG troche  Take 1 tablet (10 mg total) by mouth 5 (five) times daily. 06/17/15   Janith Lima, MD  Coenzyme Q10 200 MG capsule Take 200 mg by mouth daily.    Historical Provider, MD  diclofenac sodium (VOLTAREN) 1 % GEL Apply 2 g topically daily as needed (knee pain). Patient not taking: Reported on 08/17/2015 10/26/14   Florencia Reasons, MD  Dietary Management Product [facility]) TABS Take 1 capsule by mouth daily. Patient not taking: Reported on 08/17/2015 06/17/15   Janith Lima, MD  DULoxetine (CYMBALTA) 60 MG capsule Take 60 mg by mouth daily.    Historical Provider, MD  ezetimibe (ZETIA) 10 MG tablet Take 1 tablet (10 mg total) by mouth daily. 08/12/15   [name], MD  fenofibrate 160 MG tablet TAKE 1 TABLET (160 MG TOTAL) BY MOUTH DAILY. MUST ESTABLISH WITH NEW PCP FOR ADDITIONAL REFILLS. 02/25/15   [name], MD  gabapentin (NEURONTIN) 300 MG capsule TAKE 2 CAPSULES (600 MG TOTAL) BY MOUTH 4 (FOUR) TIMES DAILY. 08/19/15   Camaria Friar, PA-C  hydrALAZINE (APRESOLINE) 25 MG tablet TAKE 1 TABLET (25 MG TOTAL) BY MOUTH 3 (THREE) TIMES DAILY. 10/28/14   Jolaine Artist, MD  HYDROcodone-acetaminophen (NORCO) 7.5-325 MG tablet Take 1 tablet by mouth every 6 (six) hours as needed for moderate pain. 08/17/15   [name], MD  insulin NPH Human (NOVOLIN N) 100 UNIT/ML injection Inject 2.2 mLs (220 Units total) into the skin every morning. 07/10/15   Renato Shin, MD  ketoconazole (NIZORAL) 2 % cream Apply 1 application topically daily. Reported on 08/17/2015    Historical Provider, MD  lactobacillus (FLORANEX/LACTINEX) PACK Take 1 packet (1 g total) by mouth 3 (three) times daily with meals. Patient not taking: Reported on 08/17/2015 10/26/14   Florencia Reasons, MD  Magnesium 500 MG TABS Take 500 mg by mouth daily.    Historical Provider, MD  metolazone (ZAROXOLYN) 5 MG tablet TAKE 5mg  (1 tablet) Saturday.  Can take an additional as needed for wt gain 3lbs overnight or 5 lbs within one  week. Patient not taking: Reported on 08/17/2015 05/09/15   Aniza Friar, PA-C  nitrofurantoin, macrocrystal-monohydrate, (MACROBID) 100 MG capsule Take 1 capsule (100 mg total) by mouth 2 (two) times daily. 08/17/15   [name], MD  nystatin-triamcinolone ointment [facility]) Apply 1 application topically 2 (two) times daily. 03/31/15   [name], MD  omega-3 acid ethyl esters (LOVAZA) 1 g capsule Take 2 capsules (2 g  total) by mouth 2 (two) times daily. 07/06/15   Larey Dresser, MD  potassium chloride 20 MEQ TBCR Take 40 mEq by mouth 2 (two) times daily. With an additional 40 on Sat 05/15/15   Tymara Friar, PA-C  rOPINIRole (REQUIP) 2 MG tablet Take 1 tablet (2 mg total) by mouth 3 (three) times daily as needed. 05/04/15   [name], MD  spironolactone (ALDACTONE) 25 MG tablet TAKE 1 TABLET BY MOUTH DAILY 04/29/15   Jolaine Artist, MD  torsemide (DEMADEX) 100 MG tablet TAKE 1 TABLET (100 MG TOTAL) BY MOUTH 2 (TWO) TIMES DAILY. 05/04/15   Larey Dresser, MD  triamcinolone cream (KENALOG) 0.1 % Apply 1 application topically 2 (two) times daily as needed (rash). Reported on 08/17/2015    Historical Provider, MD  vitamin B-12 (CYANOCOBALAMIN) 500 MCG tablet Take 500 mcg by mouth daily. Reported on 03/19/2015    Historical Provider, MD   BP 103/35 mmHg  Pulse 57  Temp(Src) 97.4 F (36.3 C) (Oral)  Resp 16  Ht 4\' 10"  (1.473 m)  Wt 256 lb (116.121 kg)  BMI 53.52 kg/m2  SpO2 93% Physical Exam  Constitutional: She is oriented to person, place, and time. She appears well-developed and well-nourished. No distress.  Drowsy but easily arousable  HENT:  Head: Normocephalic and atraumatic.  Nose: Nose normal.  Mouth/Throat: Oropharynx is clear and moist. No oropharyngeal exudate.  Eyes: Conjunctivae and EOM are normal. Pupils are equal, round, and reactive to light. No scleral icterus.  Neck: Normal range of motion. Neck supple. No JVD present. No tracheal  deviation present. No thyromegaly present.  Cardiovascular: Normal rate, regular rhythm and normal heart sounds.  Exam reveals no gallop and no friction rub.   No murmur heard. Pulmonary/Chest: Effort normal and breath sounds normal. No respiratory distress. She has no wheezes. She exhibits no tenderness.  Abdominal: Soft. Bowel sounds are normal. She exhibits no distension and no mass. There is no tenderness. There is no rebound and no guarding.  Musculoskeletal: Normal range of motion. She exhibits no edema or tenderness.  Lymphadenopathy:    She has no cervical adenopathy.  Neurological: She is alert and oriented to person, place, and time. No cranial nerve deficit. She exhibits normal muscle tone.  Skin: Skin is warm and dry. No rash noted. No erythema. No pallor.  Nursing note and vitals reviewed.   ED Course  Procedures (including critical care time)  DIAGNOSTIC STUDIES: Oxygen Saturation is 94% on RA, adequate by my interpretation.    COORDINATION OF CARE: 3:32 AM Will order chest x-ray and labs. Will monitor in ED and return to re-evaluate pt. Discussed treatment plan with pt at bedside and pt agreed to plan.   Labs Review Labs Reviewed  CBC WITH DIFFERENTIAL/PLATELET - Abnormal; Notable for the following:    RDW 15.9 (*)    All other components within normal limits  BASIC METABOLIC PANEL - Abnormal; Notable for the following:    Potassium 3.2 (*)    Chloride 97 (*)    BUN 38 (*)    Creatinine, Ser 1.54 (*)    GFR calc non Af Amer 33 (*)    GFR calc Af Amer 38 (*)    All other components within normal limits  CBG MONITORING, ED - Abnormal; Notable for the following:    Glucose-Capillary 120 (*)    All other components within normal limits  URINE CULTURE  URINALYSIS, ROUTINE W REFLEX MICROSCOPIC (NOT AT [facility])  CBG MONITORING, ED  CBG MONITORING, ED    Imaging Review Dg Chest 2 View  08/25/2015  CLINICAL DATA:  Acute onset of hypoglycemia and extreme fatigue.  Initial encounter. EXAM: CHEST  2 VIEW COMPARISON:  Chest radiograph from 11/16/2014 FINDINGS: The lungs are well-aerated. Vascular congestion is noted. Mild bibasilar opacities could reflect mild interstitial edema. No definite pleural effusion or pneumothorax is seen. The heart is normal in size; the mediastinal contour is within normal limits. No acute osseous abnormalities are seen. Calcification overlying the right humeral head likely reflects calcific tendinitis. IMPRESSION: 1. Vascular congestion noted. Mild bibasilar opacities could reflect mild interstitial edema. 2. Calcification overlying the right humeral head likely reflects calcific tendinitis. Electronically Signed   By: Garald Balding M.D.   On: 08/25/2015 04:13   I have personally reviewed and evaluated these images and lab results as part of my medical decision-making.   EKG Interpretation   Date/Time:  Tuesday August 25 2015 04:14:11 EDT Ventricular Rate:  58 PR Interval:    QRS Duration: 170 QT Interval:  525 QTC Calculation: 516 R Axis:   -50 Text Interpretation:  Sinus rhythm Probable left atrial enlargement RBBB  and LAFB Lateral infarct, acute No significant change since last tracing  Confirmed by Glynn Octave [phone]) on 08/25/2015 6:09:08 AM      MDM   Final diagnoses:  None   Patient presents to the ED for AMS due to hypoglycemia.  Sugar was corrected prehospital.  Will hold in the ED on repeat FS and to ensure stabilization.  Infectious work up is negative.  No fever or WBC.  EKG does not show any ischemia.  This may be due to her diabetes medication.  Advised to have PCP review them for correction.  She appears well and in NAD. VS remain within her normal limits and sheis safe for DC.    I personally performed the services described in this documentation, which was scribed in my presence. The recorded information has been reviewed and is accurate.     Everlene Balls, MD 08/25/15 [phone]

## 2015-08-25 NOTE — ED Notes (Signed)
CBG 

## 2015-08-27 DIAGNOSIS — M47816 Spondylosis without myelopathy or radiculopathy, lumbar region: Secondary | ICD-10-CM | POA: Diagnosis not present

## 2015-08-27 DIAGNOSIS — M170 Bilateral primary osteoarthritis of knee: Secondary | ICD-10-CM | POA: Diagnosis not present

## 2015-08-27 DIAGNOSIS — Z9981 Dependence on supplemental oxygen: Secondary | ICD-10-CM | POA: Diagnosis not present

## 2015-08-27 DIAGNOSIS — I130 Hypertensive heart and chronic kidney disease with heart failure and stage 1 through stage 4 chronic kidney disease, or unspecified chronic kidney disease: Secondary | ICD-10-CM | POA: Diagnosis not present

## 2015-08-27 DIAGNOSIS — L03115 Cellulitis of right lower limb: Secondary | ICD-10-CM | POA: Diagnosis not present

## 2015-08-27 DIAGNOSIS — G2581 Restless legs syndrome: Secondary | ICD-10-CM | POA: Diagnosis not present

## 2015-08-27 DIAGNOSIS — L03116 Cellulitis of left lower limb: Secondary | ICD-10-CM | POA: Diagnosis not present

## 2015-08-27 DIAGNOSIS — E785 Hyperlipidemia, unspecified: Secondary | ICD-10-CM | POA: Diagnosis not present

## 2015-08-27 DIAGNOSIS — Z5181 Encounter for therapeutic drug level monitoring: Secondary | ICD-10-CM | POA: Diagnosis not present

## 2015-08-27 DIAGNOSIS — G4733 Obstructive sleep apnea (adult) (pediatric): Secondary | ICD-10-CM | POA: Diagnosis not present

## 2015-08-27 DIAGNOSIS — E1122 Type 2 diabetes mellitus with diabetic chronic kidney disease: Secondary | ICD-10-CM | POA: Diagnosis not present

## 2015-08-27 DIAGNOSIS — Z7901 Long term (current) use of anticoagulants: Secondary | ICD-10-CM | POA: Diagnosis not present

## 2015-08-27 DIAGNOSIS — Z87440 Personal history of urinary (tract) infections: Secondary | ICD-10-CM | POA: Diagnosis not present

## 2015-08-27 DIAGNOSIS — N183 Chronic kidney disease, stage 3 (moderate): Secondary | ICD-10-CM | POA: Diagnosis not present

## 2015-08-27 DIAGNOSIS — E1142 Type 2 diabetes mellitus with diabetic polyneuropathy: Secondary | ICD-10-CM | POA: Diagnosis not present

## 2015-08-27 DIAGNOSIS — Z794 Long term (current) use of insulin: Secondary | ICD-10-CM | POA: Diagnosis not present

## 2015-08-27 DIAGNOSIS — E662 Morbid (severe) obesity with alveolar hypoventilation: Secondary | ICD-10-CM | POA: Diagnosis not present

## 2015-08-27 DIAGNOSIS — Z7982 Long term (current) use of aspirin: Secondary | ICD-10-CM | POA: Diagnosis not present

## 2015-08-27 DIAGNOSIS — I5042 Chronic combined systolic (congestive) and diastolic (congestive) heart failure: Secondary | ICD-10-CM | POA: Diagnosis not present

## 2015-08-28 ENCOUNTER — Ambulatory Visit (HOSPITAL_COMMUNITY)
Admission: RE | Admit: 2015-08-28 | Discharge: 2015-08-28 | Disposition: A | Discharge status: Home / Self Care | Payer: PPO | Source: Ambulatory Visit | Attending: Cardiology | Admitting: Cardiology

## 2015-08-28 DIAGNOSIS — E119 Type 2 diabetes mellitus without complications: Secondary | ICD-10-CM | POA: Diagnosis not present

## 2015-08-28 DIAGNOSIS — R0989 Other specified symptoms and signs involving the circulatory and respiratory systems: Secondary | ICD-10-CM | POA: Insufficient documentation

## 2015-08-28 DIAGNOSIS — I70203 Unspecified atherosclerosis of native arteries of extremities, bilateral legs: Secondary | ICD-10-CM | POA: Insufficient documentation

## 2015-08-28 DIAGNOSIS — I10 Essential (primary) hypertension: Secondary | ICD-10-CM | POA: Insufficient documentation

## 2015-08-28 DIAGNOSIS — E785 Hyperlipidemia, unspecified: Secondary | ICD-10-CM | POA: Diagnosis not present

## 2015-08-30 ENCOUNTER — Other Ambulatory Visit: Payer: Self-pay | Admitting: Internal Medicine

## 2015-08-31 ENCOUNTER — Other Ambulatory Visit: Payer: Self-pay | Admitting: Cardiology

## 2015-08-31 DIAGNOSIS — I700 Atherosclerosis of aorta: Secondary | ICD-10-CM

## 2015-09-02 ENCOUNTER — Ambulatory Visit (HOSPITAL_COMMUNITY)
Admission: RE | Admit: 2015-09-02 | Discharge: 2015-09-02 | Disposition: A | Discharge status: Home / Self Care | Payer: PPO | Source: Ambulatory Visit | Attending: Cardiology | Admitting: Cardiology

## 2015-09-02 DIAGNOSIS — I110 Hypertensive heart disease with heart failure: Secondary | ICD-10-CM | POA: Insufficient documentation

## 2015-09-02 DIAGNOSIS — E1142 Type 2 diabetes mellitus with diabetic polyneuropathy: Secondary | ICD-10-CM | POA: Diagnosis not present

## 2015-09-02 DIAGNOSIS — I708 Atherosclerosis of other arteries: Secondary | ICD-10-CM | POA: Diagnosis not present

## 2015-09-02 DIAGNOSIS — K219 Gastro-esophageal reflux disease without esophagitis: Secondary | ICD-10-CM | POA: Diagnosis not present

## 2015-09-02 DIAGNOSIS — G4733 Obstructive sleep apnea (adult) (pediatric): Secondary | ICD-10-CM | POA: Diagnosis not present

## 2015-09-02 DIAGNOSIS — I700 Atherosclerosis of aorta: Secondary | ICD-10-CM | POA: Diagnosis not present

## 2015-09-02 DIAGNOSIS — Z794 Long term (current) use of insulin: Secondary | ICD-10-CM | POA: Insufficient documentation

## 2015-09-02 DIAGNOSIS — E785 Hyperlipidemia, unspecified: Secondary | ICD-10-CM | POA: Insufficient documentation

## 2015-09-02 DIAGNOSIS — I5032 Chronic diastolic (congestive) heart failure: Secondary | ICD-10-CM | POA: Insufficient documentation

## 2015-09-03 DIAGNOSIS — L03116 Cellulitis of left lower limb: Secondary | ICD-10-CM | POA: Diagnosis not present

## 2015-09-03 DIAGNOSIS — Z7982 Long term (current) use of aspirin: Secondary | ICD-10-CM | POA: Diagnosis not present

## 2015-09-03 DIAGNOSIS — E1122 Type 2 diabetes mellitus with diabetic chronic kidney disease: Secondary | ICD-10-CM | POA: Diagnosis not present

## 2015-09-03 DIAGNOSIS — M170 Bilateral primary osteoarthritis of knee: Secondary | ICD-10-CM | POA: Diagnosis not present

## 2015-09-03 DIAGNOSIS — E785 Hyperlipidemia, unspecified: Secondary | ICD-10-CM | POA: Diagnosis not present

## 2015-09-03 DIAGNOSIS — Z7901 Long term (current) use of anticoagulants: Secondary | ICD-10-CM | POA: Diagnosis not present

## 2015-09-03 DIAGNOSIS — N183 Chronic kidney disease, stage 3 (moderate): Secondary | ICD-10-CM | POA: Diagnosis not present

## 2015-09-03 DIAGNOSIS — G2581 Restless legs syndrome: Secondary | ICD-10-CM | POA: Diagnosis not present

## 2015-09-03 DIAGNOSIS — Z794 Long term (current) use of insulin: Secondary | ICD-10-CM | POA: Diagnosis not present

## 2015-09-03 DIAGNOSIS — I5042 Chronic combined systolic (congestive) and diastolic (congestive) heart failure: Secondary | ICD-10-CM | POA: Diagnosis not present

## 2015-09-03 DIAGNOSIS — Z87440 Personal history of urinary (tract) infections: Secondary | ICD-10-CM | POA: Diagnosis not present

## 2015-09-03 DIAGNOSIS — G4733 Obstructive sleep apnea (adult) (pediatric): Secondary | ICD-10-CM | POA: Diagnosis not present

## 2015-09-03 DIAGNOSIS — Z5181 Encounter for therapeutic drug level monitoring: Secondary | ICD-10-CM | POA: Diagnosis not present

## 2015-09-03 DIAGNOSIS — E662 Morbid (severe) obesity with alveolar hypoventilation: Secondary | ICD-10-CM | POA: Diagnosis not present

## 2015-09-03 DIAGNOSIS — L03115 Cellulitis of right lower limb: Secondary | ICD-10-CM | POA: Diagnosis not present

## 2015-09-03 DIAGNOSIS — Z9981 Dependence on supplemental oxygen: Secondary | ICD-10-CM | POA: Diagnosis not present

## 2015-09-03 DIAGNOSIS — M47816 Spondylosis without myelopathy or radiculopathy, lumbar region: Secondary | ICD-10-CM | POA: Diagnosis not present

## 2015-09-03 DIAGNOSIS — I130 Hypertensive heart and chronic kidney disease with heart failure and stage 1 through stage 4 chronic kidney disease, or unspecified chronic kidney disease: Secondary | ICD-10-CM | POA: Diagnosis not present

## 2015-09-03 DIAGNOSIS — E1142 Type 2 diabetes mellitus with diabetic polyneuropathy: Secondary | ICD-10-CM | POA: Diagnosis not present

## 2015-09-08 ENCOUNTER — Telehealth: Payer: Self-pay | Admitting: *Deleted

## 2015-09-08 NOTE — Telephone Encounter (Signed)
(+)  urine culture, no response to phone or letter to home, no further treatment received.

## 2015-09-09 ENCOUNTER — Ambulatory Visit (INDEPENDENT_AMBULATORY_CARE_PROVIDER_SITE_OTHER): Payer: PPO | Admitting: Endocrinology

## 2015-09-09 ENCOUNTER — Encounter: Payer: Self-pay | Admitting: Endocrinology

## 2015-09-09 VITALS — BP 132/62 | HR 57 | Wt 255.0 lb

## 2015-09-09 DIAGNOSIS — E1129 Type 2 diabetes mellitus with other diabetic kidney complication: Secondary | ICD-10-CM | POA: Diagnosis not present

## 2015-09-09 LAB — POCT GLYCOSYLATED HEMOGLOBIN (HGB A1C): Hemoglobin A1C: 9.6

## 2015-09-09 MED ORDER — INSULIN NPH ISOPHANE & REGULAR (70-30) 100 UNIT/ML ~~LOC~~ SUSP: 220.0000 [IU] | Freq: Every day | SUBCUTANEOUS | Status: DC

## 2015-09-09 NOTE — Progress Notes (Signed)
Subjective:    Patient ID: Melissa Myers, female    DOB: May 01, 1945, 70 y.o.   MRN: [id number]  HPI Pt returns for f/u of diabetes mellitus: DM type: Insulin-requiring type 2.   Dx'ed: [id number] Complications: sensory neuropathy of the lower extremities, renal insufficiency, and leg ulcers.  Therapy: insulin since 2011 GDM: never DKA: never Severe hypoglycemia: several times; most recently in early 2017.  Pancreatitis: never Other: she takes QD insulin, after poor results with multiple daily injections; she is too ill to undergo weight loss surgery.  she takes human insulin, due to cost.   Interval history: no cbg record, but states cbg's vary from 78-300's.  It is in general higher as the day goes on.   pt states she feels well in general, except for chronic foot pain and insomnia.  She says she never misses the insulin.   Past Medical History  Diagnosis Date  . Type II or unspecified type diabetes mellitus without mention of complication, not stated as uncontrolled     insulin dep  . Hyperlipidemia     hx rhabdo on statins  . Hypertension   . Diastolic CHF ([place])   . Chronic diarrhea     a/w nausea - felt related to IBS  . RLS (restless legs syndrome)   . Osteoarthritis   . Stasis dermatitis   . GERD (gastroesophageal reflux disease)   . On home oxygen therapy     uses oxygen 2 liters min per [place] at night and prn during day  . Anemia   . Neuropathy (HCC)     feet, toes and fingers  . Disc degeneration, lumbar   . OSA (obstructive sleep apnea)     05/2009 sleep study - refuses CPAP  . Deaf     left side only  . Shortness of breath     chronic  . Cellulitis     LOWER EXTREMITIES    Past Surgical History  Procedure Laterality Date  . Tubal ligation  1980  . Cholecystectomy  1997  . Uterine polyp removal  2008  . Umbilical hernia repair  1995  . Tonsillectomy  1970  . Colonoscopy N/A 12/03/2012    Procedure: COLONOSCOPY;  Surgeon: Lafayette Dragon, MD;  Location: [facility]  ENDOSCOPY;  Service: Endoscopy;  Laterality: N/A;    Social History   Social History  . Marital Status: Married    Spouse Name: N/A  . Number of Children: N/A  . Years of Education: N/A   Occupational History  . Not on file.   Social History Main Topics  . Smoking status: Never Smoker   . Smokeless tobacco: Never Used  . Alcohol Use: No  . Drug Use: No  . Sexual Activity: No   Other Topics Concern  . Not on file   Social History Narrative   Lives with spouse and mother. Retired Network engineer, now housewife    Current Outpatient Prescriptions on File Prior to Visit  Medication Sig Dispense Refill  . aspirin EC 81 MG tablet Take 1 tablet (81 mg total) by mouth daily.    . B Complex-C (B-COMPLEX WITH VITAMIN C) tablet Take 1 tablet by mouth daily.    . carvedilol (COREG) 25 MG tablet TAKE 1 TABLET BY MOUTH TWICE DAILY WITH FOOD 60 tablet 5  . Cholecalciferol (VITAMIN D3) 2000 units capsule Take 1 capsule (2,000 Units total) by mouth daily. 90 capsule 3  . citalopram (CELEXA) 20 MG tablet Take 1 tablet (20 mg  total) by mouth daily. 30 tablet 3  . Coenzyme Q10 200 MG capsule Take 200 mg by mouth daily.    . DULoxetine (CYMBALTA) 60 MG capsule Take 60 mg by mouth daily.    [name] ezetimibe (ZETIA) 10 MG tablet Take 1 tablet (10 mg total) by mouth daily. 90 tablet 0  . fenofibrate 160 MG tablet TAKE 1 TABLET (160 MG TOTAL) BY MOUTH DAILY. MUST ESTABLISH WITH NEW PCP FOR ADDITIONAL REFILLS. 90 tablet 3  . gabapentin (NEURONTIN) 300 MG capsule TAKE 2 CAPSULES (600 MG TOTAL) BY MOUTH 4 (FOUR) TIMES DAILY. 240 capsule 0  . hydrALAZINE (APRESOLINE) 25 MG tablet TAKE 1 TABLET (25 MG TOTAL) BY MOUTH 3 (THREE) TIMES DAILY. 90 tablet 2  . HYDROcodone-acetaminophen (NORCO) 7.5-325 MG tablet Take 1 tablet by mouth every 6 (six) hours as needed for moderate pain. 120 tablet 0  . ketoconazole (NIZORAL) 2 % cream Apply 1 application topically daily. Reported on 08/17/2015    . Magnesium 500 MG TABS Take  500 mg by mouth daily.    . nitrofurantoin, macrocrystal-monohydrate, (MACROBID) 100 MG capsule Take 1 capsule (100 mg total) by mouth 2 (two) times daily. 14 capsule 0  . omega-3 acid ethyl esters (LOVAZA) 1 g capsule Take 2 capsules (2 g total) by mouth 2 (two) times daily. 270 capsule 3  . potassium chloride 20 MEQ TBCR Take 40 mEq by mouth 2 (two) times daily. With an additional 40 on Sat 145 tablet 6  . rOPINIRole (REQUIP) 2 MG tablet Take 1 tablet (2 mg total) by mouth 3 (three) times daily as needed. 270 tablet 3  . spironolactone (ALDACTONE) 25 MG tablet TAKE 1 TABLET BY MOUTH DAILY 30 tablet 2  . torsemide (DEMADEX) 100 MG tablet TAKE 1 TABLET (100 MG TOTAL) BY MOUTH 2 (TWO) TIMES DAILY. 60 tablet 5  . clotrimazole (MYCELEX) 10 MG troche Take 1 tablet (10 mg total) by mouth 5 (five) times daily. (Patient not taking: Reported on 09/09/2015) 140 tablet 2  . diclofenac sodium (VOLTAREN) 1 % GEL Apply 2 g topically daily as needed (knee pain). (Patient not taking: Reported on 08/17/2015) 10 g 3  . Dietary Management Product (VASCULERA) TABS Take 1 capsule by mouth daily. (Patient not taking: Reported on 08/17/2015) 30 tablet 11  . lactobacillus (FLORANEX/LACTINEX) PACK Take 1 packet (1 g total) by mouth 3 (three) times daily with meals. (Patient not taking: Reported on 09/09/2015) 30 packet 0  . metolazone (ZAROXOLYN) 5 MG tablet TAKE 5mg  (1 tablet) Saturday.  Can take an additional as needed for wt gain 3lbs overnight or 5 lbs within one week. (Patient not taking: Reported on 08/17/2015) 15 tablet 3  . nystatin-triamcinolone ointment (MYCOLOG) Apply 1 application topically 2 (two) times daily. (Patient not taking: Reported on 09/09/2015) 60 g 2  . rOPINIRole (REQUIP XL) 4 MG 24 hr tablet TAKE 1 TABLET (4 MG TOTAL) BY MOUTH AT BEDTIME. (Patient not taking: Reported on 09/09/2015) 30 tablet 2  . triamcinolone cream (KENALOG) 0.1 % Apply 1 application topically 2 (two) times daily as needed (rash).  Reported on 09/09/2015    . vitamin B-12 (CYANOCOBALAMIN) 500 MCG tablet Take 500 mcg by mouth daily. Reported on 09/09/2015     No current facility-administered medications on file prior to visit.    Allergies  Allergen Reactions  . Cymbalta [Duloxetine Hcl] Other (See Comments)    Restless leg syndrome  . Statins Other (See Comments)    Statin drugs cause muscle pain / "  muscle damage"--was told by MD not to take  . Sulfa Antibiotics Diarrhea  . Levemir [Insulin Detemir] Itching  . Morphine Other (See Comments)    GI upset and headaches  . Zinc Swelling and Rash    Family History  Problem Relation Age of Onset  . Heart disease Mother   . Heart disease Father   . Heart disease      family history  . Stomach cancer Paternal Grandmother   . Lung cancer Paternal Grandfather   . Colon cancer Neg Hx   . CVA      several aunts  . Heart attack Mother 42  . Heart attack Father 75  . Heart attack      several aunts and an uncle    BP 132/62 mmHg  Pulse 57  Wt 255 lb (115.667 kg)  SpO2 92%  Review of Systems She denies hypoglycemia    Objective:   Physical Exam VITAL SIGNS:  See vs page. GENERAL: no distress. In wheelchair Pulses: dorsalis pedis intact bilat.   MSK: no deformity of the feet  CV: 2+ bilat leg edema  Skin: no ulcer on the feet, but the skin is dry. normal temp on the feet. The legs have bilat spotty hyperpigmentation, and both feet have erythema when held dependent.  Neuro: sensation is intact to touch on the feet, but decreased from normal.  Ext: There is bilateral onychomycosis of the toenails, and bilat heavy calluses.    A1c=9.6% Lab Results  Component Value Date   CREATININE 1.54* 08/25/2015   BUN 38* 08/25/2015   NA 138 08/25/2015   K 3.2* 08/25/2015   CL 97* 08/25/2015   CO2 30 08/25/2015      Assessment & Plan:  Insulin-requiring type 2 DM: she needs increased rx.   Renal insufficiency: based on this and the pattern of her cbg's,  she needs faster-acting qd insulin.    Patient is advised the following: Patient Instructions  check your blood sugar twice a day.  vary the time of day when you check, between before the 3 meals, and at bedtime.  also check if you have symptoms of your blood sugar being too high or too low.  please keep a record of the readings and bring it to your next appointment here.  You can write it on any piece of paper.  please call us sooner if your blood sugar goes below 70, or if you have a lot of readings over 200.  Please change the NPH insulin to "70/30," 220 units each morning.  I have sent a prescription to your pharmacy.   On this type of insulin schedule, you should eat meals on a regular schedule.  If a meal is missed or significantly delayed, your blood sugar could go low.  Please come back for a follow-up appointment in 2 months.      Renato Shin, MD

## 2015-09-09 NOTE — Patient Instructions (Addendum)
check your blood sugar twice a day.  vary the time of day when you check, between before the 3 meals, and at bedtime.  also check if you have symptoms of your blood sugar being too high or too low.  please keep a record of the readings and bring it to your next appointment here.  You can write it on any piece of paper.  please call us sooner if your blood sugar goes below 70, or if you have a lot of readings over 200.  Please change the NPH insulin to "70/30," 220 units each morning.  I have sent a prescription to your pharmacy.   On this type of insulin schedule, you should eat meals on a regular schedule.  If a meal is missed or significantly delayed, your blood sugar could go low.  Please come back for a follow-up appointment in 2 months.

## 2015-09-10 DIAGNOSIS — G2581 Restless legs syndrome: Secondary | ICD-10-CM | POA: Diagnosis not present

## 2015-09-10 DIAGNOSIS — Z5181 Encounter for therapeutic drug level monitoring: Secondary | ICD-10-CM | POA: Diagnosis not present

## 2015-09-10 DIAGNOSIS — E1122 Type 2 diabetes mellitus with diabetic chronic kidney disease: Secondary | ICD-10-CM | POA: Diagnosis not present

## 2015-09-10 DIAGNOSIS — Z794 Long term (current) use of insulin: Secondary | ICD-10-CM | POA: Diagnosis not present

## 2015-09-10 DIAGNOSIS — L03116 Cellulitis of left lower limb: Secondary | ICD-10-CM | POA: Diagnosis not present

## 2015-09-10 DIAGNOSIS — I5042 Chronic combined systolic (congestive) and diastolic (congestive) heart failure: Secondary | ICD-10-CM | POA: Diagnosis not present

## 2015-09-10 DIAGNOSIS — G4733 Obstructive sleep apnea (adult) (pediatric): Secondary | ICD-10-CM | POA: Diagnosis not present

## 2015-09-10 DIAGNOSIS — M170 Bilateral primary osteoarthritis of knee: Secondary | ICD-10-CM | POA: Diagnosis not present

## 2015-09-10 DIAGNOSIS — Z87440 Personal history of urinary (tract) infections: Secondary | ICD-10-CM | POA: Diagnosis not present

## 2015-09-10 DIAGNOSIS — Z7901 Long term (current) use of anticoagulants: Secondary | ICD-10-CM | POA: Diagnosis not present

## 2015-09-10 DIAGNOSIS — M47816 Spondylosis without myelopathy or radiculopathy, lumbar region: Secondary | ICD-10-CM | POA: Diagnosis not present

## 2015-09-10 DIAGNOSIS — Z9981 Dependence on supplemental oxygen: Secondary | ICD-10-CM | POA: Diagnosis not present

## 2015-09-10 DIAGNOSIS — L03115 Cellulitis of right lower limb: Secondary | ICD-10-CM | POA: Diagnosis not present

## 2015-09-10 DIAGNOSIS — Z7982 Long term (current) use of aspirin: Secondary | ICD-10-CM | POA: Diagnosis not present

## 2015-09-10 DIAGNOSIS — I130 Hypertensive heart and chronic kidney disease with heart failure and stage 1 through stage 4 chronic kidney disease, or unspecified chronic kidney disease: Secondary | ICD-10-CM | POA: Diagnosis not present

## 2015-09-10 DIAGNOSIS — E1142 Type 2 diabetes mellitus with diabetic polyneuropathy: Secondary | ICD-10-CM | POA: Diagnosis not present

## 2015-09-10 DIAGNOSIS — N183 Chronic kidney disease, stage 3 (moderate): Secondary | ICD-10-CM | POA: Diagnosis not present

## 2015-09-10 DIAGNOSIS — E662 Morbid (severe) obesity with alveolar hypoventilation: Secondary | ICD-10-CM | POA: Diagnosis not present

## 2015-09-10 DIAGNOSIS — E785 Hyperlipidemia, unspecified: Secondary | ICD-10-CM | POA: Diagnosis not present

## 2015-09-14 ENCOUNTER — Other Ambulatory Visit: Payer: Self-pay | Admitting: Student

## 2015-09-14 ENCOUNTER — Encounter (HOSPITAL_COMMUNITY): Payer: Self-pay | Admitting: *Deleted

## 2015-09-16 ENCOUNTER — Encounter: Payer: Self-pay | Admitting: Internal Medicine

## 2015-09-16 DIAGNOSIS — M47816 Spondylosis without myelopathy or radiculopathy, lumbar region: Secondary | ICD-10-CM | POA: Diagnosis not present

## 2015-09-16 DIAGNOSIS — E662 Morbid (severe) obesity with alveolar hypoventilation: Secondary | ICD-10-CM | POA: Diagnosis not present

## 2015-09-16 DIAGNOSIS — I130 Hypertensive heart and chronic kidney disease with heart failure and stage 1 through stage 4 chronic kidney disease, or unspecified chronic kidney disease: Secondary | ICD-10-CM | POA: Diagnosis not present

## 2015-09-16 DIAGNOSIS — N183 Chronic kidney disease, stage 3 (moderate): Secondary | ICD-10-CM | POA: Diagnosis not present

## 2015-09-16 DIAGNOSIS — L03115 Cellulitis of right lower limb: Secondary | ICD-10-CM | POA: Diagnosis not present

## 2015-09-16 DIAGNOSIS — M170 Bilateral primary osteoarthritis of knee: Secondary | ICD-10-CM | POA: Diagnosis not present

## 2015-09-16 DIAGNOSIS — E785 Hyperlipidemia, unspecified: Secondary | ICD-10-CM | POA: Diagnosis not present

## 2015-09-16 DIAGNOSIS — E1142 Type 2 diabetes mellitus with diabetic polyneuropathy: Secondary | ICD-10-CM | POA: Diagnosis not present

## 2015-09-16 DIAGNOSIS — L03116 Cellulitis of left lower limb: Secondary | ICD-10-CM | POA: Diagnosis not present

## 2015-09-16 DIAGNOSIS — Z5181 Encounter for therapeutic drug level monitoring: Secondary | ICD-10-CM | POA: Diagnosis not present

## 2015-09-16 DIAGNOSIS — E1122 Type 2 diabetes mellitus with diabetic chronic kidney disease: Secondary | ICD-10-CM | POA: Diagnosis not present

## 2015-09-16 DIAGNOSIS — Z87440 Personal history of urinary (tract) infections: Secondary | ICD-10-CM | POA: Diagnosis not present

## 2015-09-16 DIAGNOSIS — G4733 Obstructive sleep apnea (adult) (pediatric): Secondary | ICD-10-CM | POA: Diagnosis not present

## 2015-09-16 DIAGNOSIS — Z7901 Long term (current) use of anticoagulants: Secondary | ICD-10-CM | POA: Diagnosis not present

## 2015-09-16 DIAGNOSIS — Z794 Long term (current) use of insulin: Secondary | ICD-10-CM | POA: Diagnosis not present

## 2015-09-16 DIAGNOSIS — G2581 Restless legs syndrome: Secondary | ICD-10-CM | POA: Diagnosis not present

## 2015-09-16 DIAGNOSIS — Z7982 Long term (current) use of aspirin: Secondary | ICD-10-CM | POA: Diagnosis not present

## 2015-09-16 DIAGNOSIS — Z9981 Dependence on supplemental oxygen: Secondary | ICD-10-CM | POA: Diagnosis not present

## 2015-09-16 DIAGNOSIS — I5042 Chronic combined systolic (congestive) and diastolic (congestive) heart failure: Secondary | ICD-10-CM | POA: Diagnosis not present

## 2015-09-18 ENCOUNTER — Other Ambulatory Visit: Payer: Self-pay | Admitting: Internal Medicine

## 2015-09-24 ENCOUNTER — Other Ambulatory Visit (INDEPENDENT_AMBULATORY_CARE_PROVIDER_SITE_OTHER): Payer: PPO

## 2015-09-24 ENCOUNTER — Encounter: Payer: Self-pay | Admitting: Internal Medicine

## 2015-09-24 ENCOUNTER — Ambulatory Visit (INDEPENDENT_AMBULATORY_CARE_PROVIDER_SITE_OTHER): Payer: PPO | Admitting: Internal Medicine

## 2015-09-24 ENCOUNTER — Telehealth: Payer: Self-pay | Admitting: Emergency Medicine

## 2015-09-24 VITALS — BP 112/56 | HR 59 | Temp 98.2°F

## 2015-09-24 DIAGNOSIS — I10 Essential (primary) hypertension: Secondary | ICD-10-CM | POA: Diagnosis not present

## 2015-09-24 DIAGNOSIS — I5032 Chronic diastolic (congestive) heart failure: Secondary | ICD-10-CM | POA: Diagnosis not present

## 2015-09-24 DIAGNOSIS — R300 Dysuria: Secondary | ICD-10-CM | POA: Diagnosis not present

## 2015-09-24 LAB — URINALYSIS, ROUTINE W REFLEX MICROSCOPIC
BILIRUBIN URINE: NEGATIVE
HGB URINE DIPSTICK: NEGATIVE
KETONES UR: NEGATIVE
NITRITE: NEGATIVE
Specific Gravity, Urine: 1.01 (ref 1.000–1.030)
TOTAL PROTEIN, URINE-UPE24: NEGATIVE
URINE GLUCOSE: 100 — AB
Urobilinogen, UA: 0.2 (ref 0.0–1.0)
pH: 6 (ref 5.0–8.0)

## 2015-09-24 MED ORDER — CEPHALEXIN 500 MG PO CAPS: 500.0000 mg | ORAL_CAPSULE | Freq: Four times a day (QID) | ORAL | 0 refills | Status: AC

## 2015-09-24 NOTE — Patient Instructions (Signed)
Please take all new medication as prescribed - the antibiotic  Your specimen will be cultured but will take 2-3 days to return  Please continue all other medications as before, and refills have been done if requested.  Please have the pharmacy call with any other refills you may need.  Please keep your appointments with your specialists as you may have planned

## 2015-09-24 NOTE — Assessment & Plan Note (Signed)
stable overall by history and exam, and pt to continue medical treatment as before,  to f/u any worsening symptoms or concerns 

## 2015-09-24 NOTE — Telephone Encounter (Signed)
Left message on voice mail giving verbal orders to continue care.

## 2015-09-24 NOTE — Progress Notes (Signed)
Subjective:    Patient ID: Melissa Myers, female    DOB: Aug 08, 1945, 70 y.o.   MRN: [id number]  HPI  Here with 2-3 days onset dysuria and freq but Denies urinary symptoms such as urgency, flank pain, hematuria or n/v, fever, chills.  Cannot recall last UTI,  Pt denies fever, wt loss, night sweats, loss of appetite, or other constitutional symptoms Pt denies chest pain, increased sob or doe, wheezing, orthopnea, PND, increased LE swelling, palpitations, dizziness or syncope.  Pt denies new neurological symptoms such as new headache, or facial or extremity weakness or numbness   Past Medical History:  Diagnosis Date  . Anemia   . Cellulitis    LOWER EXTREMITIES  . Chronic diarrhea    a/w nausea - felt related to IBS  . Deaf    left side only  . Diastolic CHF ([place])   . Disc degeneration, lumbar   . GERD (gastroesophageal reflux disease)   . Hyperlipidemia    hx rhabdo on statins  . Hypertension   . Neuropathy (HCC)    feet, toes and fingers  . On home oxygen therapy    uses oxygen 2 liters min per [place] at night and prn during day  . OSA (obstructive sleep apnea)    05/2009 sleep study - refuses CPAP  . Osteoarthritis   . RLS (restless legs syndrome)   . Shortness of breath    chronic  . Stasis dermatitis   . Type II or unspecified type diabetes mellitus without mention of complication, not stated as uncontrolled    insulin dep   Past Surgical History:  Procedure Laterality Date  . CHOLECYSTECTOMY  1997  . COLONOSCOPY N/A 12/03/2012   Procedure: COLONOSCOPY;  Surgeon: Lafayette Dragon, MD;  Location: [facility];  Service: Endoscopy;  Laterality: N/A;  . TONSILLECTOMY  1970  . TUBAL LIGATION  1980  . UMBILICAL HERNIA REPAIR  1995  . uterine polyp removal  2008    reports that she has never smoked. She has never used smokeless tobacco. She reports that she does not drink alcohol or use drugs. family history includes Heart attack (age of onset: 23) in her mother; Heart  attack (age of onset: 5) in her father; Heart disease in her father and mother; Lung cancer in her paternal grandfather; Stomach cancer in her paternal grandmother. Allergies  Allergen Reactions  . Cymbalta [Duloxetine Hcl] Other (See Comments)    Restless leg syndrome  . Statins Other (See Comments)    Statin drugs cause muscle pain / "muscle damage"--was told by MD not to take  . Sulfa Antibiotics Diarrhea  . Levemir [Insulin Detemir] Itching  . Morphine Other (See Comments)    GI upset and headaches  . Zinc Swelling and Rash  ' Current Outpatient Prescriptions on File Prior to Visit  Medication Sig Dispense Refill  . aspirin EC 81 MG tablet Take 1 tablet (81 mg total) by mouth daily.    . B Complex-C (B-COMPLEX WITH VITAMIN C) tablet Take 1 tablet by mouth daily.    . carvedilol (COREG) 25 MG tablet TAKE 1 TABLET BY MOUTH TWICE DAILY WITH FOOD 60 tablet 5  . Cholecalciferol (VITAMIN D3) 2000 units capsule Take 1 capsule (2,000 Units total) by mouth daily. 90 capsule 3  . citalopram (CELEXA) 20 MG tablet TAKE 1 TABLET (20 MG TOTAL) BY MOUTH DAILY. 30 tablet 2  . clotrimazole (MYCELEX) 10 MG troche Take 1 tablet (10 mg total) by mouth 5 (five)  times daily. 140 tablet 2  . Coenzyme Q10 200 MG capsule Take 200 mg by mouth daily.    . diclofenac sodium (VOLTAREN) 1 % GEL Apply 2 g topically daily as needed (knee pain). 10 g 3  . Dietary Management Product (VASCULERA) TABS Take 1 capsule by mouth daily. 30 tablet 11  . DULoxetine (CYMBALTA) 60 MG capsule Take 60 mg by mouth daily.    [name] ezetimibe (ZETIA) 10 MG tablet Take 1 tablet (10 mg total) by mouth daily. 90 tablet 0  . fenofibrate 160 MG tablet TAKE 1 TABLET (160 MG TOTAL) BY MOUTH DAILY. MUST ESTABLISH WITH NEW PCP FOR ADDITIONAL REFILLS. 90 tablet 3  . gabapentin (NEURONTIN) 300 MG capsule TAKE TWO CAPSULES BY MOUTH FOUR TIMES A DAY 240 capsule 0  . hydrALAZINE (APRESOLINE) 25 MG tablet TAKE 1 TABLET (25 MG TOTAL) BY MOUTH 3 (THREE)  TIMES DAILY. 90 tablet 2  . HYDROcodone-acetaminophen (NORCO) 7.5-325 MG tablet Take 1 tablet by mouth every 6 (six) hours as needed for moderate pain. 120 tablet 0  . insulin NPH-regular Human (NOVOLIN 70/30 RELION) (70-30) 100 UNIT/ML injection Inject 220 Units into the skin daily with breakfast. 80 mL 11  . ketoconazole (NIZORAL) 2 % cream Apply 1 application topically daily. Reported on 08/17/2015    . lactobacillus (FLORANEX/LACTINEX) PACK Take 1 packet (1 g total) by mouth 3 (three) times daily with meals. 30 packet 0  . Magnesium 500 MG TABS Take 500 mg by mouth daily.    . metolazone (ZAROXOLYN) 5 MG tablet TAKE 5mg  (1 tablet) Saturday.  Can take an additional as needed for wt gain 3lbs overnight or 5 lbs within one week. 15 tablet 3  . nystatin-triamcinolone ointment (MYCOLOG) Apply 1 application topically 2 (two) times daily. 60 g 2  . omega-3 acid ethyl esters (LOVAZA) 1 g capsule Take 2 capsules (2 g total) by mouth 2 (two) times daily. 270 capsule 3  . potassium chloride 20 MEQ TBCR Take 40 mEq by mouth 2 (two) times daily. With an additional 40 on Sat 145 tablet 6  . rOPINIRole (REQUIP) 2 MG tablet Take 1 tablet (2 mg total) by mouth 3 (three) times daily as needed. 270 tablet 3  . spironolactone (ALDACTONE) 25 MG tablet TAKE 1 TABLET BY MOUTH DAILY 30 tablet 2  . torsemide (DEMADEX) 100 MG tablet TAKE 1 TABLET (100 MG TOTAL) BY MOUTH 2 (TWO) TIMES DAILY. 60 tablet 5  . triamcinolone cream (KENALOG) 0.1 % Apply 1 application topically 2 (two) times daily as needed (rash). Reported on 09/09/2015    . vitamin B-12 (CYANOCOBALAMIN) 500 MCG tablet Take 500 mcg by mouth daily. Reported on 09/09/2015    . nitrofurantoin, macrocrystal-monohydrate, (MACROBID) 100 MG capsule Take 1 capsule (100 mg total) by mouth 2 (two) times daily. (Patient not taking: Reported on 09/24/2015) 14 capsule 0  . rOPINIRole (REQUIP XL) 4 MG 24 hr tablet TAKE 1 TABLET (4 MG TOTAL) BY MOUTH AT BEDTIME. (Patient not  taking: Reported on 09/09/2015) 30 tablet 2   No current facility-administered medications on file prior to visit.    Review of Systems  Constitutional: Negative for unusual diaphoresis or night sweats HENT: Negative for ear swelling or discharge Eyes: Negative for worsening visual haziness  Respiratory: Negative for choking and stridor.   Gastrointestinal: Negative for distension or worsening eructation Genitourinary: Negative for retention or change in urine volume.  Musculoskeletal: Negative for other MSK pain or swelling Skin: Negative for color change and  worsening wound Neurological: Negative for tremors and numbness other than noted  Psychiatric/Behavioral: Negative for decreased concentration or agitation other than above       Objective:   Physical Exam BP (!) 112/56   Pulse (!) 59   Temp 98.2 F (36.8 C)   SpO2 96%  VS noted,  Constitutional: Pt appears in no apparent distress HENT: Head: NCAT.  Right Ear: External ear normal.  Left Ear: External ear normal.  Eyes: . Pupils are equal, round, and reactive to light. Conjunctivae and EOM are normal Neck: Normal range of motion. Neck supple.  Cardiovascular: Normal rate and regular rhythm.   Pulmonary/Chest: Effort normal and breath sounds without rales or wheezing.  Abd:  Soft, ND, + BS with low mid abd tender, no guarding or rebound, no flank tender Neurological: Pt is alert. Not confused , motor grossly intact Skin: Skin is warm. No rash, no LE edema Psychiatric: Pt behavior is normal. No agitation.     Assessment & Plan:

## 2015-09-24 NOTE — Progress Notes (Signed)
Pre visit review using our clinic review tool, if applicable. No additional management support is needed unless otherwise documented below in the visit note. 

## 2015-09-24 NOTE — Assessment & Plan Note (Signed)
stable overall by history and exam, recent data reviewed with pt, and pt to continue medical treatment as before,  to f/u any worsening symptoms or concerns BP Readings from Last 3 Encounters:  09/24/15 (!) 112/56  09/09/15 132/62  08/25/15 (!) 110/35

## 2015-09-24 NOTE — Telephone Encounter (Signed)
[facility] manages patient for congestive heart failure. They need a verbal order every 60 days to continue care. They need a verbal order by next week to continue care. Please follow up thanks.

## 2015-09-25 ENCOUNTER — Other Ambulatory Visit: Payer: Self-pay | Admitting: *Deleted

## 2015-09-25 MED ORDER — DULOXETINE HCL 60 MG PO CPEP: 60.0000 mg | ORAL_CAPSULE | Freq: Every day | ORAL | 1 refills | Status: DC

## 2015-09-26 LAB — URINE CULTURE

## 2015-09-28 ENCOUNTER — Telehealth: Payer: Self-pay

## 2015-09-28 DIAGNOSIS — G894 Chronic pain syndrome: Secondary | ICD-10-CM

## 2015-09-28 DIAGNOSIS — M79672 Pain in left foot: Secondary | ICD-10-CM

## 2015-09-28 MED ORDER — HYDROCODONE-ACETAMINOPHEN 7.5-325 MG PO TABS: 1.0000 | ORAL_TABLET | Freq: Four times a day (QID) | ORAL | 0 refills | Status: DC | PRN

## 2015-09-28 NOTE — Telephone Encounter (Signed)
Pt called requesting a refill of Hydrocodone 7.5/325, please advise in Dr Nathanial Millman absence. Thanks!

## 2015-09-28 NOTE — Telephone Encounter (Signed)
Pt informed, Rx in cabinet for pt pick up  

## 2015-09-28 NOTE — Telephone Encounter (Signed)
Refilled in PCP absence  

## 2015-09-29 ENCOUNTER — Telehealth: Payer: Self-pay | Admitting: Internal Medicine

## 2015-09-29 NOTE — Telephone Encounter (Signed)
Mailing [facility] care to patient. Was waiting for her pickup but she never came for it.

## 2015-10-05 DIAGNOSIS — E162 Hypoglycemia, unspecified: Secondary | ICD-10-CM | POA: Diagnosis not present

## 2015-10-05 DIAGNOSIS — E161 Other hypoglycemia: Secondary | ICD-10-CM | POA: Diagnosis not present

## 2015-10-06 ENCOUNTER — Encounter (HOSPITAL_COMMUNITY): Payer: PPO

## 2015-10-08 ENCOUNTER — Encounter (HOSPITAL_COMMUNITY): Payer: Self-pay | Admitting: Emergency Medicine

## 2015-10-08 ENCOUNTER — Inpatient Hospital Stay (HOSPITAL_COMMUNITY)
Admission: EM | Admit: 2015-10-08 | Discharge: 2015-10-10 | DRG: 637 | Disposition: A | Discharge status: Home / Self Care | Payer: PPO | Attending: Internal Medicine | Admitting: Internal Medicine

## 2015-10-08 DIAGNOSIS — L97919 Non-pressure chronic ulcer of unspecified part of right lower leg with unspecified severity: Secondary | ICD-10-CM | POA: Diagnosis present

## 2015-10-08 DIAGNOSIS — I110 Hypertensive heart disease with heart failure: Secondary | ICD-10-CM | POA: Diagnosis present

## 2015-10-08 DIAGNOSIS — E11641 Type 2 diabetes mellitus with hypoglycemia with coma: Secondary | ICD-10-CM | POA: Diagnosis not present

## 2015-10-08 DIAGNOSIS — E11622 Type 2 diabetes mellitus with other skin ulcer: Secondary | ICD-10-CM | POA: Diagnosis not present

## 2015-10-08 DIAGNOSIS — H9192 Unspecified hearing loss, left ear: Secondary | ICD-10-CM | POA: Diagnosis not present

## 2015-10-08 DIAGNOSIS — R404 Transient alteration of awareness: Secondary | ICD-10-CM | POA: Diagnosis not present

## 2015-10-08 DIAGNOSIS — E11649 Type 2 diabetes mellitus with hypoglycemia without coma: Principal | ICD-10-CM | POA: Diagnosis present

## 2015-10-08 DIAGNOSIS — Z9981 Dependence on supplemental oxygen: Secondary | ICD-10-CM

## 2015-10-08 DIAGNOSIS — Z794 Long term (current) use of insulin: Secondary | ICD-10-CM | POA: Diagnosis not present

## 2015-10-08 DIAGNOSIS — E162 Hypoglycemia, unspecified: Secondary | ICD-10-CM | POA: Diagnosis not present

## 2015-10-08 DIAGNOSIS — R4182 Altered mental status, unspecified: Secondary | ICD-10-CM

## 2015-10-08 DIAGNOSIS — I10 Essential (primary) hypertension: Secondary | ICD-10-CM | POA: Diagnosis present

## 2015-10-08 DIAGNOSIS — D649 Anemia, unspecified: Secondary | ICD-10-CM | POA: Diagnosis present

## 2015-10-08 DIAGNOSIS — I5032 Chronic diastolic (congestive) heart failure: Secondary | ICD-10-CM | POA: Diagnosis not present

## 2015-10-08 DIAGNOSIS — G9340 Encephalopathy, unspecified: Secondary | ICD-10-CM | POA: Diagnosis not present

## 2015-10-08 DIAGNOSIS — Z801 Family history of malignant neoplasm of trachea, bronchus and lung: Secondary | ICD-10-CM

## 2015-10-08 DIAGNOSIS — R001 Bradycardia, unspecified: Secondary | ICD-10-CM | POA: Diagnosis present

## 2015-10-08 DIAGNOSIS — E119 Type 2 diabetes mellitus without complications: Secondary | ICD-10-CM

## 2015-10-08 DIAGNOSIS — G4733 Obstructive sleep apnea (adult) (pediatric): Secondary | ICD-10-CM | POA: Diagnosis present

## 2015-10-08 DIAGNOSIS — E785 Hyperlipidemia, unspecified: Secondary | ICD-10-CM | POA: Diagnosis present

## 2015-10-08 DIAGNOSIS — E1142 Type 2 diabetes mellitus with diabetic polyneuropathy: Secondary | ICD-10-CM | POA: Diagnosis not present

## 2015-10-08 DIAGNOSIS — L97929 Non-pressure chronic ulcer of unspecified part of left lower leg with unspecified severity: Secondary | ICD-10-CM | POA: Diagnosis present

## 2015-10-08 DIAGNOSIS — E1129 Type 2 diabetes mellitus with other diabetic kidney complication: Secondary | ICD-10-CM | POA: Diagnosis not present

## 2015-10-08 DIAGNOSIS — K219 Gastro-esophageal reflux disease without esophagitis: Secondary | ICD-10-CM | POA: Diagnosis present

## 2015-10-08 DIAGNOSIS — Z7982 Long term (current) use of aspirin: Secondary | ICD-10-CM

## 2015-10-08 DIAGNOSIS — Z9049 Acquired absence of other specified parts of digestive tract: Secondary | ICD-10-CM | POA: Diagnosis not present

## 2015-10-08 DIAGNOSIS — Z823 Family history of stroke: Secondary | ICD-10-CM | POA: Diagnosis not present

## 2015-10-08 DIAGNOSIS — Z800 Family history of malignant neoplasm of digestive organs: Secondary | ICD-10-CM

## 2015-10-08 DIAGNOSIS — Z8249 Family history of ischemic heart disease and other diseases of the circulatory system: Secondary | ICD-10-CM | POA: Diagnosis not present

## 2015-10-08 DIAGNOSIS — E1122 Type 2 diabetes mellitus with diabetic chronic kidney disease: Secondary | ICD-10-CM

## 2015-10-08 DIAGNOSIS — G2581 Restless legs syndrome: Secondary | ICD-10-CM | POA: Diagnosis present

## 2015-10-08 DIAGNOSIS — IMO0001 Reserved for inherently not codable concepts without codable children: Secondary | ICD-10-CM | POA: Diagnosis present

## 2015-10-08 DIAGNOSIS — I83019 Varicose veins of right lower extremity with ulcer of unspecified site: Secondary | ICD-10-CM | POA: Diagnosis present

## 2015-10-08 DIAGNOSIS — I83029 Varicose veins of left lower extremity with ulcer of unspecified site: Secondary | ICD-10-CM

## 2015-10-08 DIAGNOSIS — N184 Chronic kidney disease, stage 4 (severe): Secondary | ICD-10-CM

## 2015-10-08 DIAGNOSIS — R0602 Shortness of breath: Secondary | ICD-10-CM | POA: Diagnosis not present

## 2015-10-08 LAB — COMPREHENSIVE METABOLIC PANEL
ALT: 31 U/L (ref 14–54)
ANION GAP: 4 — AB (ref 5–15)
AST: 58 U/L — ABNORMAL HIGH (ref 15–41)
Albumin: 3.2 g/dL — ABNORMAL LOW (ref 3.5–5.0)
Alkaline Phosphatase: 69 U/L (ref 38–126)
BUN: 26 mg/dL — ABNORMAL HIGH (ref 6–20)
CHLORIDE: 109 mmol/L (ref 101–111)
CO2: 27 mmol/L (ref 22–32)
CREATININE: 0.93 mg/dL (ref 0.44–1.00)
Calcium: 8.9 mg/dL (ref 8.9–10.3)
Glucose, Bld: 146 mg/dL — ABNORMAL HIGH (ref 65–99)
Potassium: 4.1 mmol/L (ref 3.5–5.1)
SODIUM: 140 mmol/L (ref 135–145)
Total Bilirubin: 0.3 mg/dL (ref 0.3–1.2)
Total Protein: 6.8 g/dL (ref 6.5–8.1)

## 2015-10-08 LAB — CBC WITH DIFFERENTIAL/PLATELET
BASOS ABS: 0 10*3/uL (ref 0.0–0.1)
BASOS PCT: 0 %
EOS PCT: 2 %
Eosinophils Absolute: 0.2 10*3/uL (ref 0.0–0.7)
HCT: 31.9 % — ABNORMAL LOW (ref 36.0–46.0)
Hemoglobin: 10.2 g/dL — ABNORMAL LOW (ref 12.0–15.0)
Lymphocytes Relative: 30 %
Lymphs Abs: 2.4 10*3/uL (ref 0.7–4.0)
MCH: 26.7 pg (ref 26.0–34.0)
MCHC: 32 g/dL (ref 30.0–36.0)
MCV: 83.5 fL (ref 78.0–100.0)
MONO ABS: 0.5 10*3/uL (ref 0.1–1.0)
Monocytes Relative: 6 %
NEUTROS ABS: 5 10*3/uL (ref 1.7–7.7)
Neutrophils Relative %: 62 %
PLATELETS: 295 10*3/uL (ref 150–400)
RBC: 3.82 MIL/uL — AB (ref 3.87–5.11)
RDW: 16.4 % — AB (ref 11.5–15.5)
WBC: 8.1 10*3/uL (ref 4.0–10.5)

## 2015-10-08 LAB — CBG MONITORING, ED
GLUCOSE-CAPILLARY: 45 mg/dL — AB (ref 65–99)
Glucose-Capillary: 112 mg/dL — ABNORMAL HIGH (ref 65–99)

## 2015-10-08 LAB — BRAIN NATRIURETIC PEPTIDE: B NATRIURETIC PEPTIDE 5: 35.8 pg/mL (ref 0.0–100.0)

## 2015-10-08 LAB — TROPONIN I

## 2015-10-08 MED ORDER — DEXTROSE 5 % IV SOLN
Freq: Once | INTRAVENOUS | Status: AC
  Administered 2015-10-08: 75 mL via INTRAVENOUS

## 2015-10-08 MED ORDER — DEXTROSE 50 % IV SOLN
50.0000 mL | Freq: Once | INTRAVENOUS | Status: AC
  Administered 2015-10-08: 50 mL via INTRAVENOUS
  Filled 2015-10-08: qty 50

## 2015-10-08 MED ORDER — SODIUM CHLORIDE 0.9 % IV SOLN: INTRAVENOUS | Status: DC

## 2015-10-08 NOTE — ED Notes (Signed)
EKG given to EDP,Pfeiffer,MD., for review. 

## 2015-10-08 NOTE — ED Triage Notes (Signed)
Per EMS, Pt was visiting someone at a nursing home and her blood sugar dropped. Pt was cool, diaphoretic, only alert to painful stimuli at the time EMS arrived. Pt given 12.5 D50 through IV. A&O x4 at this time. Pt's cbg 35 upon EMS arrival.

## 2015-10-08 NOTE — ED Notes (Signed)
Notified RN,Lauren pt. CBG 45 and EDP,Pfeiffer made aware.

## 2015-10-08 NOTE — ED Notes (Signed)
Bed: WA10 Expected date:  Expected time:  Means of arrival:  Comments: EMS 

## 2015-10-08 NOTE — ED Provider Notes (Signed)
[facility] DEPT Provider Note   CSN: [id number] Arrival date & time: 10/08/15  2101     History   Chief Complaint Chief Complaint  Patient presents with  . Hypoglycemia    HPI Melissa Myers is a 70 y.o. female.  HPI At this time, patient cannot provide detailed history. She denies headache, cheat pain or short of breath. Reports feels lethargic History is from EMS. Reportedly patient was visiting at a nursing home facility. She became unresponsive and EMS CBG was 35. Patient awakened to administration of D50. On the medic arrival she was responsive only to painful stimuli was cool and diaphoretic. Past Medical History:  Diagnosis Date  . Anemia   . Cellulitis    LOWER EXTREMITIES  . Chronic diarrhea    a/w nausea - felt related to IBS  . Deaf    left side only  . Diastolic CHF ([place])   . Disc degeneration, lumbar   . GERD (gastroesophageal reflux disease)   . Hyperlipidemia    hx rhabdo on statins  . Hypertension   . Neuropathy (HCC)    feet, toes and fingers  . On home oxygen therapy    uses oxygen 2 liters min per [place] at night and prn during day  . OSA (obstructive sleep apnea)    05/2009 sleep study - refuses CPAP  . Osteoarthritis   . RLS (restless legs syndrome)   . Shortness of breath    chronic  . Stasis dermatitis   . Type II or unspecified type diabetes mellitus without mention of complication, not stated as uncontrolled    insulin dep    Patient Active Problem List   Diagnosis Date Noted  . Hypoglycemia 10/08/2015  . Left foot pain 07/10/2015  . Murmur, cardiac 07/02/2015  . E. coli UTI (urinary tract infection) 06/19/2015  . Oral thrush 06/17/2015  . Dysuria 05/15/2015  . Chronic pain syndrome 05/05/2015  . MDD (major depressive disorder), single episode, severe ([place]) 05/05/2015  . Chronic anemia 04/14/2015  . DM (diabetes mellitus), type 2 with renal complications ([place]) [id number]  . Rectal bleeding 03/13/2015  . Hyperlipidemia  12/23/2014  . Deficiency anemia 07/26/2012  . Venous stasis ulcers of both lower extremities 01/04/2011  . INSOMNIA 08/03/2009  . Chronic diastolic heart failure ([place]) 06/01/2009  . Vitamin D deficiency 05/12/2009  . Sleep apnea 04/29/2009  . Dyslipidemia 04/20/2009  . Morbid obesity ([place]) 04/20/2009  . RESTLESS LEGS SYNDROME 04/20/2009  . Peripheral neuropathy ([place]) 04/20/2009  . Essential hypertension 04/20/2009  . BACK PAIN, LUMBAR, CHRONIC 04/20/2009    Past Surgical History:  Procedure Laterality Date  . CHOLECYSTECTOMY  1997  . COLONOSCOPY N/A 12/03/2012   Procedure: COLONOSCOPY;  Surgeon: Lafayette Dragon, MD;  Location: [facility];  Service: Endoscopy;  Laterality: N/A;  . TONSILLECTOMY  1970  . TUBAL LIGATION  1980  . UMBILICAL HERNIA REPAIR  1995  . uterine polyp removal  2008    OB History    No data available       Home Medications    Prior to Admission medications   Medication Sig Start Date End Date Taking? Authorizing Provider  aspirin EC 81 MG tablet Take 1 tablet (81 mg total) by mouth daily. 07/08/14   Rowe Clack, MD  B Complex-C (B-COMPLEX WITH VITAMIN C) tablet Take 1 tablet by mouth daily.    Historical Provider, MD  carvedilol (COREG) 25 MG tablet TAKE 1 TABLET BY MOUTH TWICE DAILY WITH FOOD 11/06/14  Jolaine Artist, MD  Cholecalciferol (VITAMIN D3) 2000 units capsule Take 1 capsule (2,000 Units total) by mouth daily. 06/17/15   Janith Lima, MD  citalopram (CELEXA) 20 MG tablet TAKE 1 TABLET (20 MG TOTAL) BY MOUTH DAILY. 09/18/15   [name], MD  clotrimazole (MYCELEX) 10 MG troche Take 1 tablet (10 mg total) by mouth 5 (five) times daily. 06/17/15   Janith Lima, MD  Coenzyme Q10 200 MG capsule Take 200 mg by mouth daily.    Historical Provider, MD  diclofenac sodium (VOLTAREN) 1 % GEL Apply 2 g topically daily as needed (knee pain). 10/26/14   Florencia Reasons, MD  Dietary Management Product (VASCULERA) TABS Take 1 capsule by mouth  daily. 06/17/15   Janith Lima, MD  DULoxetine (CYMBALTA) 60 MG capsule Take 1 capsule (60 mg total) by mouth daily. 09/25/15   [name], MD  ezetimibe (ZETIA) 10 MG tablet Take 1 tablet (10 mg total) by mouth daily. 08/12/15   [name], MD  fenofibrate 160 MG tablet TAKE 1 TABLET (160 MG TOTAL) BY MOUTH DAILY. MUST ESTABLISH WITH NEW PCP FOR ADDITIONAL REFILLS. 02/25/15   [name], MD  gabapentin (NEURONTIN) 300 MG capsule TAKE TWO CAPSULES BY MOUTH FOUR TIMES A DAY 09/14/15   Thalya Friar, PA-C  hydrALAZINE (APRESOLINE) 25 MG tablet TAKE 1 TABLET (25 MG TOTAL) BY MOUTH 3 (THREE) TIMES DAILY. 10/28/14   Jolaine Artist, MD  HYDROcodone-acetaminophen (NORCO) 7.5-325 MG tablet Take 1 tablet by mouth every 6 (six) hours as needed for moderate pain. 09/28/15   Golden Circle, FNP  insulin NPH-regular Human (NOVOLIN 70/30 RELION) (70-30) 100 UNIT/ML injection Inject 220 Units into the skin daily with breakfast. 09/09/15   Renato Shin, MD  ketoconazole (NIZORAL) 2 % cream Apply 1 application topically daily. Reported on 08/17/2015    Historical Provider, MD  lactobacillus (FLORANEX/LACTINEX) PACK Take 1 packet (1 g total) by mouth 3 (three) times daily with meals. 10/26/14   Florencia Reasons, MD  Magnesium 500 MG TABS Take 500 mg by mouth daily.    Historical Provider, MD  metolazone (ZAROXOLYN) 5 MG tablet TAKE 5mg  (1 tablet) Saturday.  Can take an additional as needed for wt gain 3lbs overnight or 5 lbs within one week. 05/09/15   Ashaki Friar, PA-C  nitrofurantoin, macrocrystal-monohydrate, (MACROBID) 100 MG capsule Take 1 capsule (100 mg total) by mouth 2 (two) times daily. Patient not taking: Reported on 09/24/2015 08/17/15   [name], MD  nystatin-triamcinolone ointment [facility]) Apply 1 application topically 2 (two) times daily. 03/31/15   [name], MD  omega-3 acid ethyl esters (LOVAZA) 1 g capsule Take 2 capsules (2 g total) by mouth 2  (two) times daily. 07/06/15   Larey Dresser, MD  potassium chloride 20 MEQ TBCR Take 40 mEq by mouth 2 (two) times daily. With an additional 40 on Sat 05/15/15   Adasha Friar, PA-C  rOPINIRole (REQUIP XL) 4 MG 24 hr tablet TAKE 1 TABLET (4 MG TOTAL) BY MOUTH AT BEDTIME. Patient not taking: Reported on 09/09/2015 08/31/15   [name], MD  rOPINIRole (REQUIP) 2 MG tablet Take 1 tablet (2 mg total) by mouth 3 (three) times daily as needed. 05/04/15   [name], MD  spironolactone (ALDACTONE) 25 MG tablet TAKE 1 TABLET BY MOUTH DAILY 09/21/15   Jolaine Artist, MD  torsemide (DEMADEX) 100 MG tablet TAKE 1 TABLET (100  MG TOTAL) BY MOUTH 2 (TWO) TIMES DAILY. 05/04/15   Larey Dresser, MD  triamcinolone cream (KENALOG) 0.1 % Apply 1 application topically 2 (two) times daily as needed (rash). Reported on 09/09/2015    Historical Provider, MD  vitamin B-12 (CYANOCOBALAMIN) 500 MCG tablet Take 500 mcg by mouth daily. Reported on 09/09/2015    Historical Provider, MD    Family History Family History  Problem Relation Age of Onset  . Heart disease Mother   . Heart attack Mother 17  . Heart disease Father   . Heart attack Father 64  . Heart disease      family history  . Stomach cancer Paternal Grandmother   . Lung cancer Paternal Grandfather   . CVA      several aunts  . Heart attack      several aunts and an uncle  . Colon cancer Neg Hx     Social History Social History  Substance Use Topics  . Smoking status: Never Smoker  . Smokeless tobacco: Never Used  . Alcohol use No     Allergies   Cymbalta [duloxetine hcl]; Statins; Sulfa antibiotics; Levemir [insulin detemir]; Morphine; and Zinc   Review of Systems Review of Systems 10 Systems reviewed and are negative for acute change except as noted in the HPI.   Physical Exam Updated Vital Signs BP 121/57   Pulse (!) 53   Resp 20   SpO2 95%   Physical Exam  Constitutional:  Patient is morbidly  obese. She does not have respiratory distress. The patient is somnolent.  HENT:  Head: Normocephalic and atraumatic.  Eyes: EOM are normal. Pupils are equal, round, and reactive to light.  Neck: Neck supple.  Cardiovascular: Normal rate, regular rhythm, normal heart sounds and intact distal pulses.   Pulmonary/Chest: Effort normal and breath sounds normal.  Abdominal: Soft. She exhibits no distension. There is no tenderness.  Musculoskeletal: She exhibits edema.  Patient has 3+ peripheral edema bilateral lower extremities.  Neurological:  Patient is somnolent. She arouses to voice. Just after the patient had eaten and gotten her D50 she was much more awake and alert. Her speech was clear and oriented. She was coordinated to self-feed using both upper extremities.  Skin: Skin is warm and dry.  Psychiatric: She has a normal mood and affect.     ED Treatments / Results  Labs (all labs ordered are listed, but only abnormal results are displayed) Labs Reviewed  COMPREHENSIVE METABOLIC PANEL - Abnormal; Notable for the following:       Result Value   Glucose, Bld 146 (*)    BUN 26 (*)    Albumin 3.2 (*)    AST 58 (*)    Anion gap 4 (*)    All other components within normal limits  CBC WITH DIFFERENTIAL/PLATELET - Abnormal; Notable for the following:    RBC 3.82 (*)    Hemoglobin 10.2 (*)    HCT 31.9 (*)    RDW 16.4 (*)    All other components within normal limits  CBG MONITORING, ED - Abnormal; Notable for the following:    Glucose-Capillary 45 (*)    All other components within normal limits  CBG MONITORING, ED - Abnormal; Notable for the following:    Glucose-Capillary 112 (*)    All other components within normal limits  TROPONIN I  BRAIN NATRIURETIC PEPTIDE  URINALYSIS, ROUTINE W REFLEX MICROSCOPIC (NOT AT [facility])  BLOOD GAS, ARTERIAL  CBG MONITORING, ED  EKG  EKG Interpretation  Date/Time:  Thursday October 08 2015 21:38:17 EDT Ventricular Rate:  52 PR Interval:      QRS Duration: 156 QT Interval:  510 QTC Calculation: 475 R Axis:   40 Text Interpretation:  Sinus rhythm Right bundle branch block no sig change compared with previous. Confirmed by Johnney Killian, MD, Jeannie Done [phone]) on 10/08/2015 9:43:11 PM       Radiology No results found.  Procedures Procedures (including critical care time) CRITICAL CARE Performed by: Charlesetta Shanks   Total critical care time: 45 minutes  Critical care time was exclusive of separately billable procedures and treating other patients.  Critical care was necessary to treat or prevent imminent or life-threatening deterioration.  Critical care was time spent personally by me on the following activities: development of treatment plan with patient and/or surrogate as well as nursing, discussions with consultants, evaluation of patient's response to treatment, examination of patient, obtaining history from patient or surrogate, ordering and performing treatments and interventions, ordering and review of laboratory studies, ordering and review of radiographic studies, pulse oximetry and re-evaluation of patient's condition. Medications Ordered in ED Medications  dextrose 50 % solution 50 mL (50 mLs Intravenous Given 10/08/15 2144)  dextrose 5 % solution (75 mLs Intravenous New Bag/Given 10/08/15 2237)     Initial Impression / Assessment and Plan / ED Course  I have reviewed the triage vital signs and the nursing notes.  Pertinent labs & imaging results that were available during my care of the patient were reviewed by me and considered in my medical decision making (see chart for details).  Clinical Course   The patient has become recurrently hypoglycemic. After getting D50 she awakens temporarily but then becomes somnolent again. I did remain at the patient's bedside while she ate a peanut butter cracker and a cup of juice. During that period of time she was awake and talking to me. She was reporting that she does have  frequent problems with hypoglycemia and EMS comes to her house frequently due to this problem. She reports she is compliant with her medications but doesn't understand why she keeps having this. However upon recheck the patient became somnolent again and will require frequent CBG monitoring and cardiac monitoring until this resolves.  Consult: Patient's case was reviewed Dr. Cindie Crumbly patient will be admitted for recurrent hypoglycemia. 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

## 2015-10-09 ENCOUNTER — Observation Stay (HOSPITAL_COMMUNITY): Payer: PPO

## 2015-10-09 ENCOUNTER — Telehealth: Payer: Self-pay | Admitting: Internal Medicine

## 2015-10-09 ENCOUNTER — Encounter (HOSPITAL_COMMUNITY): Payer: Self-pay | Admitting: *Deleted

## 2015-10-09 DIAGNOSIS — E1129 Type 2 diabetes mellitus with other diabetic kidney complication: Secondary | ICD-10-CM | POA: Diagnosis not present

## 2015-10-09 DIAGNOSIS — G4733 Obstructive sleep apnea (adult) (pediatric): Secondary | ICD-10-CM | POA: Diagnosis not present

## 2015-10-09 DIAGNOSIS — E118 Type 2 diabetes mellitus with unspecified complications: Secondary | ICD-10-CM

## 2015-10-09 DIAGNOSIS — Z794 Long term (current) use of insulin: Secondary | ICD-10-CM | POA: Diagnosis not present

## 2015-10-09 DIAGNOSIS — D649 Anemia, unspecified: Secondary | ICD-10-CM

## 2015-10-09 DIAGNOSIS — G9340 Encephalopathy, unspecified: Secondary | ICD-10-CM | POA: Diagnosis not present

## 2015-10-09 DIAGNOSIS — I10 Essential (primary) hypertension: Secondary | ICD-10-CM

## 2015-10-09 DIAGNOSIS — G2581 Restless legs syndrome: Secondary | ICD-10-CM | POA: Diagnosis not present

## 2015-10-09 DIAGNOSIS — E785 Hyperlipidemia, unspecified: Secondary | ICD-10-CM | POA: Diagnosis not present

## 2015-10-09 DIAGNOSIS — R001 Bradycardia, unspecified: Secondary | ICD-10-CM

## 2015-10-09 DIAGNOSIS — Z800 Family history of malignant neoplasm of digestive organs: Secondary | ICD-10-CM | POA: Diagnosis not present

## 2015-10-09 DIAGNOSIS — L97919 Non-pressure chronic ulcer of unspecified part of right lower leg with unspecified severity: Secondary | ICD-10-CM | POA: Diagnosis not present

## 2015-10-09 DIAGNOSIS — Z801 Family history of malignant neoplasm of trachea, bronchus and lung: Secondary | ICD-10-CM | POA: Diagnosis not present

## 2015-10-09 DIAGNOSIS — Z9049 Acquired absence of other specified parts of digestive tract: Secondary | ICD-10-CM | POA: Diagnosis not present

## 2015-10-09 DIAGNOSIS — E1142 Type 2 diabetes mellitus with diabetic polyneuropathy: Secondary | ICD-10-CM | POA: Diagnosis not present

## 2015-10-09 DIAGNOSIS — Z8249 Family history of ischemic heart disease and other diseases of the circulatory system: Secondary | ICD-10-CM | POA: Diagnosis not present

## 2015-10-09 DIAGNOSIS — I110 Hypertensive heart disease with heart failure: Secondary | ICD-10-CM | POA: Diagnosis not present

## 2015-10-09 DIAGNOSIS — E1122 Type 2 diabetes mellitus with diabetic chronic kidney disease: Secondary | ICD-10-CM

## 2015-10-09 DIAGNOSIS — K219 Gastro-esophageal reflux disease without esophagitis: Secondary | ICD-10-CM | POA: Diagnosis not present

## 2015-10-09 DIAGNOSIS — H9192 Unspecified hearing loss, left ear: Secondary | ICD-10-CM | POA: Diagnosis not present

## 2015-10-09 DIAGNOSIS — Z823 Family history of stroke: Secondary | ICD-10-CM | POA: Diagnosis not present

## 2015-10-09 DIAGNOSIS — R0602 Shortness of breath: Secondary | ICD-10-CM | POA: Diagnosis not present

## 2015-10-09 DIAGNOSIS — L97929 Non-pressure chronic ulcer of unspecified part of left lower leg with unspecified severity: Secondary | ICD-10-CM | POA: Diagnosis not present

## 2015-10-09 DIAGNOSIS — I5032 Chronic diastolic (congestive) heart failure: Secondary | ICD-10-CM | POA: Diagnosis not present

## 2015-10-09 DIAGNOSIS — Z7982 Long term (current) use of aspirin: Secondary | ICD-10-CM | POA: Diagnosis not present

## 2015-10-09 DIAGNOSIS — E162 Hypoglycemia, unspecified: Secondary | ICD-10-CM

## 2015-10-09 DIAGNOSIS — E11622 Type 2 diabetes mellitus with other skin ulcer: Secondary | ICD-10-CM | POA: Diagnosis not present

## 2015-10-09 DIAGNOSIS — N184 Chronic kidney disease, stage 4 (severe): Secondary | ICD-10-CM

## 2015-10-09 DIAGNOSIS — Z9981 Dependence on supplemental oxygen: Secondary | ICD-10-CM | POA: Diagnosis not present

## 2015-10-09 DIAGNOSIS — E11649 Type 2 diabetes mellitus with hypoglycemia without coma: Secondary | ICD-10-CM | POA: Diagnosis not present

## 2015-10-09 LAB — MRSA PCR SCREENING: MRSA by PCR: NEGATIVE

## 2015-10-09 LAB — GLUCOSE, CAPILLARY
GLUCOSE-CAPILLARY: 93 mg/dL (ref 65–99)
GLUCOSE-CAPILLARY: 111 mg/dL — AB (ref 65–99)
GLUCOSE-CAPILLARY: 97 mg/dL (ref 65–99)
GLUCOSE-CAPILLARY: 146 mg/dL — AB (ref 65–99)
GLUCOSE-CAPILLARY: 138 mg/dL — AB (ref 65–99)
Glucose-Capillary: 68 mg/dL (ref 65–99)
Glucose-Capillary: 74 mg/dL (ref 65–99)
Glucose-Capillary: 84 mg/dL (ref 65–99)
Glucose-Capillary: 79 mg/dL (ref 65–99)
Glucose-Capillary: 113 mg/dL — ABNORMAL HIGH (ref 65–99)
Glucose-Capillary: 287 mg/dL — ABNORMAL HIGH (ref 65–99)

## 2015-10-09 LAB — BASIC METABOLIC PANEL
Anion gap: 7 (ref 5–15)
BUN: 24 mg/dL — AB (ref 6–20)
CO2: 28 mmol/L (ref 22–32)
CREATININE: 0.87 mg/dL (ref 0.44–1.00)
Calcium: 9.3 mg/dL (ref 8.9–10.3)
Chloride: 105 mmol/L (ref 101–111)
GFR calc Af Amer: >60 mL/min (ref >60)
Glucose, Bld: 93 mg/dL (ref 65–99)
Potassium: 4.3 mmol/L (ref 3.5–5.1)
SODIUM: 140 mmol/L (ref 135–145)

## 2015-10-09 LAB — BLOOD GAS, ARTERIAL
ACID-BASE EXCESS: 1.1 mmol/L (ref 0.0–2.0)
Bicarbonate: 26 mEq/L — ABNORMAL HIGH (ref 20.0–24.0)
Drawn by: 422461
FIO2: 0.21
O2 Saturation: 96.9 %
PCO2 ART: 44.9 mmHg (ref 35.0–45.0)
PO2 ART: 90.2 mmHg (ref 80.0–100.0)
Patient temperature: 98.6
TCO2: 24.1 mmol/L (ref 0–100)
pH, Arterial: 7.38 (ref 7.350–7.450)

## 2015-10-09 LAB — URINALYSIS, ROUTINE W REFLEX MICROSCOPIC
Bilirubin Urine: NEGATIVE
GLUCOSE, UA: NEGATIVE mg/dL
HGB URINE DIPSTICK: NEGATIVE
KETONES UR: NEGATIVE mg/dL
Nitrite: NEGATIVE
PROTEIN: NEGATIVE mg/dL
Specific Gravity, Urine: 1.019 (ref 1.005–1.030)
pH: 5.5 (ref 5.0–8.0)

## 2015-10-09 LAB — TROPONIN I
Troponin I: <0.03 ng/mL (ref <0.03)
Troponin I: <0.03 ng/mL (ref <0.03)

## 2015-10-09 LAB — CBC
HCT: 35 % — ABNORMAL LOW (ref 36.0–46.0)
Hemoglobin: 10.7 g/dL — ABNORMAL LOW (ref 12.0–15.0)
MCH: 25.8 pg — AB (ref 26.0–34.0)
MCHC: 30.6 g/dL (ref 30.0–36.0)
MCV: 84.5 fL (ref 78.0–100.0)
PLATELETS: 352 10*3/uL (ref 150–400)
RBC: 4.14 MIL/uL (ref 3.87–5.11)
RDW: 16.5 % — ABNORMAL HIGH (ref 11.5–15.5)
WBC: 7.6 10*3/uL (ref 4.0–10.5)

## 2015-10-09 LAB — URINE MICROSCOPIC-ADD ON

## 2015-10-09 LAB — CBG MONITORING, ED: Glucose-Capillary: 140 mg/dL — ABNORMAL HIGH (ref 65–99)

## 2015-10-09 LAB — TSH: TSH: 1.606 u[IU]/mL (ref 0.350–4.500)

## 2015-10-09 MED ORDER — DULOXETINE HCL 60 MG PO CPEP
60.0000 mg | ORAL_CAPSULE | Freq: Every day | ORAL | Status: DC
  Administered 2015-10-09 – 2015-10-10 (×2): 60 mg via ORAL
  Filled 2015-10-09: qty 2
  Filled 2015-10-09: qty 1

## 2015-10-09 MED ORDER — ACETAMINOPHEN 650 MG RE SUPP: 650.0000 mg | Freq: Four times a day (QID) | RECTAL | Status: DC | PRN

## 2015-10-09 MED ORDER — ENOXAPARIN SODIUM 40 MG/0.4ML ~~LOC~~ SOLN
40.0000 mg | SUBCUTANEOUS | Status: DC
  Administered 2015-10-09 – 2015-10-10 (×2): 40 mg via SUBCUTANEOUS
  Filled 2015-10-09 (×2): qty 0.4

## 2015-10-09 MED ORDER — SODIUM CHLORIDE 0.9% FLUSH
3.0000 mL | Freq: Two times a day (BID) | INTRAVENOUS | Status: DC
  Administered 2015-10-09 – 2015-10-10 (×3): 3 mL via INTRAVENOUS

## 2015-10-09 MED ORDER — SPIRONOLACTONE 25 MG PO TABS
25.0000 mg | ORAL_TABLET | Freq: Every day | ORAL | Status: DC
  Administered 2015-10-09 – 2015-10-10 (×2): 25 mg via ORAL
  Filled 2015-10-09 (×2): qty 1

## 2015-10-09 MED ORDER — GABAPENTIN 300 MG PO CAPS
600.0000 mg | ORAL_CAPSULE | Freq: Two times a day (BID) | ORAL | Status: DC
  Administered 2015-10-09 – 2015-10-10 (×3): 600 mg via ORAL
  Filled 2015-10-09 (×3): qty 2

## 2015-10-09 MED ORDER — HYDRALAZINE HCL 25 MG PO TABS
25.0000 mg | ORAL_TABLET | Freq: Three times a day (TID) | ORAL | Status: DC
  Administered 2015-10-09 – 2015-10-10 (×3): 25 mg via ORAL
  Filled 2015-10-09 (×3): qty 1

## 2015-10-09 MED ORDER — DEXTROSE 5 % IV SOLN
INTRAVENOUS | Status: DC
  Administered 2015-10-09: 02:00:00 via INTRAVENOUS

## 2015-10-09 MED ORDER — FUROSEMIDE 10 MG/ML IJ SOLN
60.0000 mg | Freq: Two times a day (BID) | INTRAMUSCULAR | Status: DC
  Administered 2015-10-09 – 2015-10-10 (×3): 60 mg via INTRAVENOUS
  Filled 2015-10-09 (×3): qty 6

## 2015-10-09 MED ORDER — ONDANSETRON HCL 4 MG PO TABS: 4.0000 mg | ORAL_TABLET | Freq: Four times a day (QID) | ORAL | Status: DC | PRN

## 2015-10-09 MED ORDER — ASPIRIN EC 81 MG PO TBEC
81.0000 mg | DELAYED_RELEASE_TABLET | Freq: Every day | ORAL | Status: DC
  Administered 2015-10-09 – 2015-10-10 (×2): 81 mg via ORAL
  Filled 2015-10-09 (×2): qty 1

## 2015-10-09 MED ORDER — ACETAMINOPHEN 325 MG PO TABS: 650.0000 mg | ORAL_TABLET | Freq: Four times a day (QID) | ORAL | Status: DC | PRN

## 2015-10-09 MED ORDER — CITALOPRAM HYDROBROMIDE 20 MG PO TABS
20.0000 mg | ORAL_TABLET | Freq: Every day | ORAL | Status: DC
  Administered 2015-10-09 – 2015-10-10 (×2): 20 mg via ORAL
  Filled 2015-10-09 (×2): qty 1

## 2015-10-09 MED ORDER — EZETIMIBE 10 MG PO TABS
10.0000 mg | ORAL_TABLET | Freq: Every day | ORAL | Status: DC
  Administered 2015-10-09 – 2015-10-10 (×2): 10 mg via ORAL
  Filled 2015-10-09 (×2): qty 1

## 2015-10-09 MED ORDER — FENOFIBRATE 160 MG PO TABS
160.0000 mg | ORAL_TABLET | Freq: Every day | ORAL | Status: DC
  Administered 2015-10-09 – 2015-10-10 (×2): 160 mg via ORAL
  Filled 2015-10-09 (×2): qty 1

## 2015-10-09 MED ORDER — HYDRALAZINE HCL 20 MG/ML IJ SOLN: 20.0000 mg | Freq: Four times a day (QID) | INTRAMUSCULAR | Status: DC | PRN

## 2015-10-09 MED ORDER — HYDROCODONE-ACETAMINOPHEN 7.5-325 MG PO TABS
1.0000 | ORAL_TABLET | Freq: Four times a day (QID) | ORAL | Status: DC | PRN
  Administered 2015-10-09 – 2015-10-10 (×3): 1 via ORAL
  Filled 2015-10-09 (×3): qty 1

## 2015-10-09 MED ORDER — ONDANSETRON HCL 4 MG/2ML IJ SOLN: 4.0000 mg | Freq: Four times a day (QID) | INTRAMUSCULAR | Status: DC | PRN

## 2015-10-09 MED ORDER — ROPINIROLE HCL 1 MG PO TABS
2.0000 mg | ORAL_TABLET | Freq: Three times a day (TID) | ORAL | Status: DC | PRN
  Administered 2015-10-09 – 2015-10-10 (×2): 2 mg via ORAL
  Filled 2015-10-09 (×4): qty 2

## 2015-10-09 NOTE — Telephone Encounter (Signed)
Referral placed.

## 2015-10-09 NOTE — H&P (Signed)
History and Physical    Melissa Myers [id number] DOB: 1945/06/21 DOA: 10/08/2015  PCP: [name], MD   Patient coming from: Home  Chief Complaint: Symptomatic hypoglycemia  HPI: Melissa Myers is a 70 y.o. woman with a history of insulin dependent diabetes, chronic diastolic heart failure, restless leg syndrome, OSA on O2 [place] 2L qHS, HTN, HLD, and GERD who presents to the ED for evaluation and management of symptomatic hypoglycemia.  She reports recurrent problems with hypoglycemia for the past years.  She is followed by an endocrinologist who is trying to manage her with a once daily regimen due to issues with medical compliance and with respect to documented allergies and cost constraints.  She is supposed to take 220 units of insulin (tells me that she about to switch from Novolin to NPH) every morning and eat "regularly" throughout the day.  She admits that she is likely not eating enough (skipped breakfast today) and she is taking her insulin at varying times daily (today she didn't take it until early afternoon).  She denies any chest pain, shortness of breath, nausea, vomiting, abdominal pain, or urinary symptoms.  She denies any recent changes to her other medications (though medications have not been verified by pharmacy yet due to mental status changes).  ED Course: Blood sugar has been refractory to 3 amps of D50.  She is now on a dextrose infusion.  Of note, sinus bradycardia with RBBB (not new) is noted on the monitor.  Hospitalist asked to admit for observation.  Review of Systems: As per HPI otherwise 10 point review of systems negative.    Past Medical History:  Diagnosis Date  . Anemia   . Cellulitis    LOWER EXTREMITIES  . Chronic diarrhea    a/w nausea - felt related to IBS  . Deaf    left side only  . Diastolic CHF ([place])   . Disc degeneration, lumbar   . GERD (gastroesophageal reflux disease)   . Hyperlipidemia    hx rhabdo on statins  .  Hypertension   . Neuropathy (HCC)    feet, toes and fingers  . On home oxygen therapy    uses oxygen 2 liters min per [place] at night and prn during day  . OSA (obstructive sleep apnea)    05/2009 sleep study - refuses CPAP  . Osteoarthritis   . RLS (restless legs syndrome)   . Shortness of breath    chronic  . Stasis dermatitis   . Type II or unspecified type diabetes mellitus without mention of complication, not stated as uncontrolled    insulin dep    Past Surgical History:  Procedure Laterality Date  . CHOLECYSTECTOMY  1997  . COLONOSCOPY N/A 12/03/2012   Procedure: COLONOSCOPY;  Surgeon: Lafayette Dragon, MD;  Location: [facility];  Service: Endoscopy;  Laterality: N/A;  . TONSILLECTOMY  1970  . TUBAL LIGATION  1980  . UMBILICAL HERNIA REPAIR  1995  . uterine polyp removal  2008     reports that she has never smoked. She has never used smokeless tobacco. She reports that she does not drink alcohol or use drugs.  She is married.  Allergies  Allergen Reactions  . Cymbalta [Duloxetine Hcl] Other (See Comments)    Restless leg syndrome  . Statins Other (See Comments)    Statin drugs cause muscle pain / "muscle damage"--was told by MD not to take  . Sulfa Antibiotics Diarrhea  . Levemir [Insulin Detemir] Itching  .  Morphine Other (See Comments)    GI upset and headaches  . Zinc Swelling and Rash    Family History  Problem Relation Age of Onset  . Heart disease Mother   . Heart attack Mother 28  . Heart disease Father   . Heart attack Father 32  . Heart disease      family history  . Stomach cancer Paternal Grandmother   . Lung cancer Paternal Grandfather   . CVA      several aunts  . Heart attack      several aunts and an uncle  . Colon cancer Neg Hx      Prior to Admission medications   Medication Sig Start Date End Date Taking? Authorizing Provider  aspirin EC 81 MG tablet Take 1 tablet (81 mg total) by mouth daily. 07/08/14   Rowe Clack, MD  B  Complex-C (B-COMPLEX WITH VITAMIN C) tablet Take 1 tablet by mouth daily.    Historical Provider, MD  carvedilol (COREG) 25 MG tablet TAKE 1 TABLET BY MOUTH TWICE DAILY WITH FOOD 11/06/14   Jolaine Artist, MD  Cholecalciferol (VITAMIN D3) 2000 units capsule Take 1 capsule (2,000 Units total) by mouth daily. 06/17/15   Janith Lima, MD  citalopram (CELEXA) 20 MG tablet TAKE 1 TABLET (20 MG TOTAL) BY MOUTH DAILY. 09/18/15   [name], MD  clotrimazole (MYCELEX) 10 MG troche Take 1 tablet (10 mg total) by mouth 5 (five) times daily. 06/17/15   Janith Lima, MD  Coenzyme Q10 200 MG capsule Take 200 mg by mouth daily.    Historical Provider, MD  diclofenac sodium (VOLTAREN) 1 % GEL Apply 2 g topically daily as needed (knee pain). 10/26/14   Florencia Reasons, MD  Dietary Management Product (VASCULERA) TABS Take 1 capsule by mouth daily. 06/17/15   Janith Lima, MD  DULoxetine (CYMBALTA) 60 MG capsule Take 1 capsule (60 mg total) by mouth daily. 09/25/15   [name], MD  ezetimibe (ZETIA) 10 MG tablet Take 1 tablet (10 mg total) by mouth daily. 08/12/15   [name], MD  fenofibrate 160 MG tablet TAKE 1 TABLET (160 MG TOTAL) BY MOUTH DAILY. MUST ESTABLISH WITH NEW PCP FOR ADDITIONAL REFILLS. 02/25/15   [name], MD  gabapentin (NEURONTIN) 300 MG capsule TAKE TWO CAPSULES BY MOUTH FOUR TIMES A DAY 09/14/15   Jaleeyah Friar, PA-C  hydrALAZINE (APRESOLINE) 25 MG tablet TAKE 1 TABLET (25 MG TOTAL) BY MOUTH 3 (THREE) TIMES DAILY. 10/28/14   Jolaine Artist, MD  HYDROcodone-acetaminophen (NORCO) 7.5-325 MG tablet Take 1 tablet by mouth every 6 (six) hours as needed for moderate pain. 09/28/15   Golden Circle, FNP  insulin NPH-regular Human (NOVOLIN 70/30 RELION) (70-30) 100 UNIT/ML injection Inject 220 Units into the skin daily with breakfast. 09/09/15   Renato Shin, MD  ketoconazole (NIZORAL) 2 % cream Apply 1 application topically daily. Reported on 08/17/2015     Historical Provider, MD  lactobacillus (FLORANEX/LACTINEX) PACK Take 1 packet (1 g total) by mouth 3 (three) times daily with meals. 10/26/14   Florencia Reasons, MD  Magnesium 500 MG TABS Take 500 mg by mouth daily.    Historical Provider, MD  metolazone (ZAROXOLYN) 5 MG tablet TAKE 5mg  (1 tablet) Saturday.  Can take an additional as needed for wt gain 3lbs overnight or 5 lbs within one week. 05/09/15   Taron Friar, PA-C  nitrofurantoin, macrocrystal-monohydrate, (MACROBID) 100 MG capsule Take  1 capsule (100 mg total) by mouth 2 (two) times daily. Patient not taking: Reported on 09/24/2015 08/17/15   [name], MD  nystatin-triamcinolone ointment [facility]) Apply 1 application topically 2 (two) times daily. 03/31/15   [name], MD  omega-3 acid ethyl esters (LOVAZA) 1 g capsule Take 2 capsules (2 g total) by mouth 2 (two) times daily. 07/06/15   Larey Dresser, MD  potassium chloride 20 MEQ TBCR Take 40 mEq by mouth 2 (two) times daily. With an additional 40 on Sat 05/15/15   Jakya Friar, PA-C  rOPINIRole (REQUIP XL) 4 MG 24 hr tablet TAKE 1 TABLET (4 MG TOTAL) BY MOUTH AT BEDTIME. Patient not taking: Reported on 09/09/2015 08/31/15   [name], MD  rOPINIRole (REQUIP) 2 MG tablet Take 1 tablet (2 mg total) by mouth 3 (three) times daily as needed. 05/04/15   [name], MD  spironolactone (ALDACTONE) 25 MG tablet TAKE 1 TABLET BY MOUTH DAILY 09/21/15   Jolaine Artist, MD  torsemide (DEMADEX) 100 MG tablet TAKE 1 TABLET (100 MG TOTAL) BY MOUTH 2 (TWO) TIMES DAILY. 05/04/15   Larey Dresser, MD  triamcinolone cream (KENALOG) 0.1 % Apply 1 application topically 2 (two) times daily as needed (rash). Reported on 09/09/2015    Historical Provider, MD  vitamin B-12 (CYANOCOBALAMIN) 500 MCG tablet Take 500 mcg by mouth daily. Reported on 09/09/2015    Historical Provider, MD    Physical Exam: Vitals:   10/09/15 0000 10/09/15 0030 10/09/15 0045 10/09/15 0125    BP: 108/56 130/61 136/67 (!) 151/65  Pulse: (!) 51 (!) 57 (!) 57 61  Resp: 15 16 16 12   Temp:    97.4 F (36.3 C)  TempSrc:    Oral  SpO2: 95% 97% 96%   Weight:    120.7 kg (266 lb 1.5 oz)  Height:    4\' 10"  (1.473 m)      Constitutional: NAD, calm, comfortable, lethargic but aroused to voice.  Able to give her own history but she requires constant prompting to stay awake during the interview. Vitals:   10/09/15 0000 10/09/15 0030 10/09/15 0045 10/09/15 0125  BP: 108/56 130/61 136/67 (!) 151/65  Pulse: (!) 51 (!) 57 (!) 57 61  Resp: 15 16 16 12   Temp:    97.4 F (36.3 C)  TempSrc:    Oral  SpO2: 95% 97% 96%   Weight:    120.7 kg (266 lb 1.5 oz)  Height:    4\' 10"  (1.473 m)   Eyes: PERRL, lids and conjunctivae normal ENMT: Mucous membranes are moist. Posterior pharynx clear of any exudate or lesions. Normal dentition.  Neck: normal appearance, supple Respiratory: clear to auscultation bilaterally, no wheezing, no crackles. Normal respiratory effort. No accessory muscle use.  Cardiovascular: Bradycardic but regular.  No murmurs / rubs / gallops. 1-2+ ankle edema bilaterally.  2+ pedal pulses. GI: abdomen is soft and compressible.  No distention.  No tenderness.  No masses palpated.  Bowel sounds are present. Musculoskeletal:  No joint deformity in upper and lower extremities. Good ROM, no contractures. Normal muscle tone.  Skin: no rashes, warm and dry, chronic stasis changes in both legs Neurologic: No focal deficits. Psychiatric: Judgment and insight appropriate. Lethargic but oriented.  Flat affect.       Labs on Admission: I have personally reviewed following labs and imaging studies  CBC:  Recent Labs Lab 10/08/15 2158  WBC 8.1  NEUTROABS 5.0  HGB 10.2*  HCT 31.9*  MCV 83.5  PLT [id number]   Basic Metabolic Panel:  Recent Labs Lab 10/08/15 2158  NA 140  K 4.1  CL 109  CO2 27  GLUCOSE 146*  BUN 26*  CREATININE 0.93  CALCIUM 8.9   GFR: Estimated  Creatinine Clearance: 64.7 mL/min (by C-G formula based on SCr of 0.93 mg/dL). Liver Function Tests:  Recent Labs Lab 10/08/15 2158  AST 58*  ALT 31  ALKPHOS 69  BILITOT 0.3  PROT 6.8  ALBUMIN 3.2*   Cardiac Enzymes:  Recent Labs Lab 10/08/15 2158  TROPONINI <0.03   BNP (last 3 results)  Recent Labs  05/04/15 1623  PROBNP 25.0   CBG:  Recent Labs Lab 10/08/15 2133 10/08/15 2220 10/09/15 0008  GLUCAP 45* 112* 140*   Urine analysis: Pending  Radiological Exams on Admission: No results found.  Chest xray pending  EKG: Independently reviewed. Sinus bradycardia with RBBB.  Assessment/Plan Principal Problem:   Hypoglycemia Active Problems:   Essential hypertension   Chronic diastolic heart failure (HCC)   Venous stasis ulcers of both lower extremities   DM (diabetes mellitus), type 2 with renal complications (HCC)   Chronic anemia   Bradycardia   Bradycardia      Recurrent hypoglycemia with history of uncontrolled insulin dependent type 2 DM --Admit to stepdown unit with telemetry monitoring since she will need POC glucose testing q1h until blood sugars are stable --Hold all insulin for now --D5W at 75 cc/hr until blood sugars are stable --Will need to cautiously resume NPH as appetite improves, consider BID dosing --U/A and chest xray pending  Sinus bradycardia --Hold BB  --Telemetry monitoring --Serial troponin and TSH  AMS secondary to hypoglycemia --Home medications to be reconciled in the AM; thus, home medications have been held for now  HTN --IV hydralazine prn until home medications verified --Holdign BB for bradycardia    DVT prophylaxis: Lovenox Code Status: FULL Family Communication: patient alone in the ED at time of admission Disposition Plan: To be determined Consults called: NONE Admission status: Observation, stepdown unit   TIME SPENT: 70 minutes   Eber Jones MD [facility] Pager  [phone]  If 7PM-7AM, please contact night-coverage [email or web address] Password [email or web address]  10/09/2015, 2:11 AM

## 2015-10-09 NOTE — Telephone Encounter (Signed)
Daughter in law, Crystal called in requesting referral to be entered to Dr. Remo [place]/ endocrinology.  Patient is not happy with current endocrinologist and wanting referral to different endo.

## 2015-10-09 NOTE — Progress Notes (Signed)
Patient transferred to 1421. Report given to RN.

## 2015-10-09 NOTE — Progress Notes (Addendum)
[place] OF CARE NOTE Patient: Melissa Myers [id number]   PCP: [name], MD DOB: 04/08/1945   DOA: 10/08/2015   DOS: 10/09/2015    Patient was admitted by my colleague Dr.Carter earlier on 10/09/2015. I have reviewed the H&P as well as assessment and plan and agree with the same. Important changes in the plan are listed below.  Plan of care: Principal Problem:   Hypoglycemia Continue D5, may stop it later in evening. May be able to transfer to telemetry later in evening.  Active Problems:   Essential hypertension   Chronic diastolic heart failure (HCC) Resume hydralazine, continue to hold beta blocker for today Change torsemide to IV lasix 60 q12 hours.  Author: Berle Mull, MD [facility] Pager: [phone] 10/09/2015 8:50 AM   If 7PM-7AM, please contact night-coverage at [email or web address], password [facility]

## 2015-10-09 NOTE — Progress Notes (Addendum)
Inpatient Diabetes Program Recommendations  AACE/ADA: New Consensus Statement on Inpatient Glycemic Control (2015)  Target Ranges:  Prepandial:   less than 140 mg/dL      Peak postprandial:   less than 180 mg/dL (1-2 hours)      Critically ill patients:  140 - 180 mg/dL   Results for Melissa Myers, Melissa Myers (MRN [id number]) as of 10/09/2015 08:57  Ref. Range 10/08/2015 21:33 10/08/2015 22:20 10/09/2015 00:08 10/09/2015 02:39 10/09/2015 04:17 10/09/2015 05:59 10/09/2015 07:37  Glucose-Capillary Latest Ref Range: 65 - 99 mg/dL 45 (L) 112 (H) 140 (H) 93 68 74 84    Admit with: Hypoglycemia  History: DM  Home DM Meds: 70/30 Insulin- 220 units daily  Current Insulin Orders: None yet- Checking CBGs Q1 hour      -Note patient saw her Endocrinologist Dr. Renato Shin with [facility] on 09/09/15.  Insulin was increased to 220 units daily at that visit.  -Dr. Cordelia Pen notes state patient requires QD regimen b/c she had poor results with multiple daily injections.  Uses Novolin Insulin due to cost.  Has documented allergy to Levemir as well.  -Patient's home insulin dose of 220 units of insulin seems like an extremely large dose of insulin to be given once daily.  Question the safety of this regimen?   -Spoke with patient and her daughter [name] (daughter is a Software engineer here at [employer]).  Unhappy with care they are receiving with Dr. Loanne Drilling.  Plan to switch ENDO practices after hospitalization.  Patient told me she is actually taking NPH insulin 220 units once daily at home and is supposed to switch to 70/30 Insulin 220 units daily when her NPH insulin runs out.  Patient expressed frustration with the fact that she has a lot of Hypoglycemia at home.  Used to take NPH once daily and Regular Insulin with meals (70 units breakfast/ 50 units Lunch/ 70 units Dinner per Record Review- See note from Dr. Loanne Drilling dated 01/12/15).  Stopped NPH and just tried Regular insulin with meals for a while.   Still had Hypoglycemia at home.  Dr. Loanne Drilling eventually switched patient to once daily insulin injection regimen.  -Patient stated that she is willing to take 4 insulin shots per day (basal + bolus) to get better CBG control.  Her daughter stated that patient will likely need large amounts of insulin to help get her under control.    MD- Could we switch patient to Lantus and Novolog here in hospital to see how she would do this regimen? Lantus and Novolog would be a more physiological approach to her CBG control versus the regimen she was taking at home of insulin once daily.  Recommend the following starting point based on her weight once CBGs stabilize:  Lantus 50 units daily (0.4 units/kg dosing)  Novolog Moderate Correction Scale/ SSI (0-15 units) TID AC + HS  Novolog 6 units tid with meals (meal coverage)     --Will follow patient during hospitalization--  [name] RN, MSN, CDE Diabetes Coordinator Inpatient Glycemic Control Team Team Pager: [phone] (8a-5p)

## 2015-10-10 ENCOUNTER — Other Ambulatory Visit: Payer: Self-pay | Admitting: Student

## 2015-10-10 DIAGNOSIS — E162 Hypoglycemia, unspecified: Secondary | ICD-10-CM | POA: Diagnosis not present

## 2015-10-10 LAB — HEMOGLOBIN A1C
Hgb A1c MFr Bld: 9.7 % — ABNORMAL HIGH (ref 4.8–5.6)
MEAN PLASMA GLUCOSE: 232 mg/dL

## 2015-10-10 LAB — GLUCOSE, CAPILLARY
GLUCOSE-CAPILLARY: 169 mg/dL — AB (ref 65–99)
Glucose-Capillary: 157 mg/dL — ABNORMAL HIGH (ref 65–99)
Glucose-Capillary: 208 mg/dL — ABNORMAL HIGH (ref 65–99)

## 2015-10-10 MED ORDER — INSULIN GLARGINE 100 UNIT/ML SOLOSTAR PEN: 50.0000 [IU] | PEN_INJECTOR | Freq: Every day | SUBCUTANEOUS | 0 refills | Status: DC

## 2015-10-10 MED ORDER — CARVEDILOL 3.125 MG PO TABS
3.1250 mg | ORAL_TABLET | Freq: Two times a day (BID) | ORAL | 0 refills | Status: DC
Start: 2015-10-10 — End: 2016-01-13

## 2015-10-10 MED ORDER — INSULIN LISPRO 100 UNIT/ML (KWIKPEN): PEN_INJECTOR | SUBCUTANEOUS | 0 refills | Status: DC

## 2015-10-10 MED ORDER — INSULIN LISPRO 100 UNIT/ML (KWIKPEN): 8.0000 [IU] | PEN_INJECTOR | Freq: Three times a day (TID) | SUBCUTANEOUS | 0 refills | Status: DC

## 2015-10-10 NOTE — Progress Notes (Signed)
Reviewed discharge information with patient and caregiver. Answered all questions. Patient/caregiver able to teach back medications and reasons to contact MD/911. Patient able to teach back/demonstrate Humalog and lantus and use of insulin pen. patient's daughter a pharmacist and helps with managing her medications. Patient able to teachback symptoms of hypoglycemia and instructed to how to manage with teach back. Instructed patient to keep blood sugar log and bring with her to f/u PCP in one week. Reviewed heart failure education with teachback, patient verbalizes understanding to call MD if weight gain over 3lbs overnight. Patient verbalizes importance of PCP follow up appointment.  Barbee Shropshire. Brigitte Pulse, RN

## 2015-10-10 NOTE — Progress Notes (Signed)
Chart reviewed. No NCM needs identified. Roger Kettles RN CCM  

## 2015-10-13 ENCOUNTER — Ambulatory Visit: Payer: PPO | Admitting: Podiatry

## 2015-10-13 NOTE — Discharge Summary (Signed)
[facility] Discharge Summary   Patient: Melissa Myers [id number]   PCP: [name], MD DOB: 06-29-1945   Date of admission: 10/08/2015   Date of discharge: 10/10/2015    Discharge Diagnoses:  Principal Problem:   Hypoglycemia Active Problems:   Essential hypertension   Chronic diastolic heart failure (HCC)   Venous stasis ulcers of both lower extremities   DM (diabetes mellitus), type 2 with renal complications (HCC)   Chronic anemia   Bradycardia   Bradycardia   Admitted From: Home Disposition:  Home  Recommendations for Outpatient Follow-up:  1. Follow-up with PCP in one week to adjust your diabetes medication as well as hypertension medication  2. Establish care with endocrinology  Follow-up Information    [name], MD .   Specialty:  Internal Medicine Why:  get DM meds adjusted after reviewing bloos sugar logs and HTN meds adjusted. Contact information: [address] [place] [address] [phone]          Diet recommendation: Cardiac and carb modified diet  Activity: The patient is advised to gradually reintroduce usual activities.  Discharge Condition: good  Code Status: Full code  History of present illness: As per the H and P dictated on admission, "Melissa Myers is a 70 y.o. woman with a history of insulin dependent diabetes, chronic diastolic heart failure, restless leg syndrome, OSA on O2 [place] 2L qHS, HTN, HLD, and GERD who presents to the ED for evaluation and management of symptomatic hypoglycemia.  She reports recurrent problems with hypoglycemia for the past years.  She is followed by an endocrinologist who is trying to manage her with a once daily regimen due to issues with medical compliance and with respect to documented allergies and cost constraints.  She is supposed to take 220 units of insulin (tells me that she about to switch from Novolin to NPH) every morning and eat "regularly" throughout the  day.  She admits that she is likely not eating enough (skipped breakfast today) and she is taking her insulin at varying times daily (today she didn't take it until early afternoon).  She denies any chest pain, shortness of breath, nausea, vomiting, abdominal pain, or urinary symptoms.  She denies any recent changes to her other medications (though medications have not been verified by pharmacy yet due to mental status changes)."  Hospital Course:  Summary of her active problems in the hospital is as following.  Principal Problem:   Hypoglycemia Type 2 diabetes mellitus. Patient presented with complaints of 2 episodes of profound hypoglycemia with blood sugar less than 30 in the last 3 days. Patient also had generalized weakness and anemia passing out episode. Also had acute encephalopathy on admission as well. Patient was admitted to the stepdown unit patient was given IV dextrose drip. With improvement in patient's mentation and blood glucose levels dextrose drip first was discontinued. As per recommendation of the diabetes educator patient was transitioned to Lantus as well as sliding scale insulin on discharge. Patient was also given scheduled insulin before meals. Patient is in the process of finding a new endocrinologist for her. Recommended patient to monitor her blood glucose 4 times a day and follow-up with PCP with blood sugar logs. Based on patient's preference Humalog was prescribed on discharge.  Active Problems:   Essential hypertension   Chronic diastolic heart failure (HCC)   Bradycardia Patient on arrival also had significant bradycardia. Patient's beta blocker was on hold. And despite being on hold patient's heart  rate as well as blood pressure was adequately controlled. Patient was on Coreg 25 mg twice a day. Dose was reduced substantially to 2.125 mg at the time of the discharge.  Rest of the patient's medications remain same.    All other chronic medical condition  were stable during the hospitalization.  Patient was ambulatory without any assistance. On the day of the discharge the patient's vitals were stable, and no other acute medical condition were reported by patient. the patient was felt safe to be discharge at home with family.  Procedures and Results:  None   Consultations:  None  DISCHARGE MEDICATION: Discharge Medication List as of 10/10/2015 11:57 AM    START taking these medications   Details  Insulin Glargine (LANTUS) 100 UNIT/ML Solostar Pen Inject 50 Units into the skin daily at 10 pm., Starting Sat 10/10/2015, Normal    !! insulin lispro (HUMALOG) 100 UNIT/ML KiwkPen Inject 0.08 mLs (8 Units total) into the skin 3 (three) times daily., Starting Sat 10/10/2015, Normal    !! insulin lispro (HUMALOG) 100 UNIT/ML KiwkPen CBG 121-150: 2 units,CBG 151-200:3 units,CBG 201-250: 5 units,CBG 251-300: 8 units,CBG 301-350: 11 units,CBG 351 - 400: 15 units., Normal     !! - Potential duplicate medications found. Please discuss with provider.    CONTINUE these medications which have CHANGED   Details  carvedilol (COREG) 3.125 MG tablet Take 1 tablet (3.125 mg total) by mouth 2 (two) times daily with a meal., Starting Sat 10/10/2015, Normal      CONTINUE these medications which have NOT CHANGED   Details  aspirin EC 81 MG tablet Take 1 tablet (81 mg total) by mouth daily., Starting 07/08/2014, Until Discontinued, OTC    B Complex-C (B-COMPLEX WITH VITAMIN C) tablet Take 1 tablet by mouth daily., Until Discontinued, Historical Med    Cholecalciferol (VITAMIN D3) 2000 units capsule Take 1 capsule (2,000 Units total) by mouth daily., Starting 06/17/2015, Until Discontinued, Normal    citalopram (CELEXA) 20 MG tablet TAKE 1 TABLET (20 MG TOTAL) BY MOUTH DAILY., Starting Fri 09/18/2015, Normal    clotrimazole (MYCELEX) 10 MG troche Take 1 tablet (10 mg total) by mouth 5 (five) times daily., Starting 06/17/2015, Until Discontinued, Normal      Coenzyme Q10 200 MG capsule Take 200 mg by mouth daily., Until Discontinued, Historical Med    diclofenac sodium (VOLTAREN) 1 % GEL Apply 2 g topically daily as needed (knee pain)., Starting 10/26/2014, Until Discontinued, Normal    Dietary Management Product (VASCULERA) TABS Take 1 capsule by mouth daily., Starting 06/17/2015, Until Discontinued, Normal    DULoxetine (CYMBALTA) 60 MG capsule Take 1 capsule (60 mg total) by mouth daily., Starting Fri 09/25/2015, Normal    ezetimibe (ZETIA) 10 MG tablet Take 1 tablet (10 mg total) by mouth daily., Starting 08/12/2015, Until Discontinued, Normal    fenofibrate 160 MG tablet TAKE 1 TABLET (160 MG TOTAL) BY MOUTH DAILY. MUST ESTABLISH WITH NEW PCP FOR ADDITIONAL REFILLS., Normal    gabapentin (NEURONTIN) 300 MG capsule TAKE TWO CAPSULES BY MOUTH FOUR TIMES A DAY, Normal    hydrALAZINE (APRESOLINE) 25 MG tablet TAKE 1 TABLET (25 MG TOTAL) BY MOUTH 3 (THREE) TIMES DAILY., Normal    HYDROcodone-acetaminophen (NORCO) 7.5-325 MG tablet Take 1 tablet by mouth every 6 (six) hours as needed for moderate pain., Starting Mon 09/28/2015, Print    ketoconazole (NIZORAL) 2 % cream Apply 1 application topically daily. Reported on 08/17/2015, Until Discontinued, Historical Med    lactobacillus (FLORANEX/LACTINEX)  PACK Take 1 packet (1 g total) by mouth 3 (three) times daily with meals., Starting 10/26/2014, Until Discontinued, Normal    Magnesium 500 MG TABS Take 500 mg by mouth daily., Until Discontinued, Historical Med    metolazone (ZAROXOLYN) 5 MG tablet TAKE 5mg  (1 tablet) Saturday.  Can take an additional as needed for wt gain 3lbs overnight or 5 lbs within one week., Normal    nystatin-triamcinolone ointment (MYCOLOG) Apply 1 application topically 2 (two) times daily., Starting 03/31/2015, Until Discontinued, Normal    omega-3 acid ethyl esters (LOVAZA) 1 g capsule Take 2 capsules (2 g total) by mouth 2 (two) times daily., Starting 07/06/2015, Until  Discontinued, Normal    potassium chloride 20 MEQ TBCR Take 40 mEq by mouth 2 (two) times daily. With an additional 40 on Sat, Starting 05/15/2015, Until Discontinued, Normal    rOPINIRole (REQUIP) 2 MG tablet Take 1 tablet (2 mg total) by mouth 3 (three) times daily as needed., Starting 05/04/2015, Until Discontinued, Normal    spironolactone (ALDACTONE) 25 MG tablet TAKE 1 TABLET BY MOUTH DAILY, Normal    torsemide (DEMADEX) 100 MG tablet TAKE 1 TABLET (100 MG TOTAL) BY MOUTH 2 (TWO) TIMES DAILY., Normal    vitamin B-12 (CYANOCOBALAMIN) 500 MCG tablet Take 500 mcg by mouth daily. Reported on 09/09/2015, Until Discontinued, Historical Med      STOP taking these medications     insulin NPH Human (HUMULIN N,NOVOLIN N) 100 UNIT/ML injection      insulin NPH-regular Human (NOVOLIN 70/30 RELION) (70-30) 100 UNIT/ML injection      nitrofurantoin, macrocrystal-monohydrate, (MACROBID) 100 MG capsule      rOPINIRole (REQUIP XL) 4 MG 24 hr tablet        Allergies  Allergen Reactions  . Cymbalta [Duloxetine Hcl] Other (See Comments)    Restless leg syndrome  . Statins Other (See Comments)    Statin drugs cause muscle pain / "muscle damage"--was told by MD not to take  . Sulfa Antibiotics Diarrhea  . Levemir [Insulin Detemir] Itching  . Morphine Other (See Comments)    GI upset and headaches  . Zinc Swelling and Rash   Discharge Instructions    Diet - low sodium heart healthy    Complete by:  As directed   Diet Carb Modified    Complete by:  As directed   Discharge instructions    Complete by:  As directed   It is important that you read following instructions as well as go over your medication list with RN to help you understand your care after this hospitalization.  Discharge Instructions: Please follow-up with PCP in one week, get your blood sugar log to them as well as get blood pressure medication adjusted.  Please request your primary care physician to go over all Hospital Tests  and Procedure/Radiological results at the follow up,  Please get all Hospital records sent to your PCP by signing hospital release before you go home.   Do not drive, operating heavy machinery, perform activities at heights, swimming or participation in water activities or provide baby sitting services ; until you have been seen by Primary Care Physician or a Neurologist and advised to do so again. Do not take more than prescribed Pain, Sleep and Anxiety Medications. You were cared for by a hospitalist during your hospital stay. If you have any questions about your discharge medications or the care you received while you were in the hospital after you are discharged, you can call the  unit and ask to speak with the hospitalist on call if the hospitalist that took care of you is not available.  Once you are discharged, your primary care physician will handle any further medical issues. Please note that NO REFILLS for any discharge medications will be authorized once you are discharged, as it is imperative that you return to your primary care physician (or establish a relationship with a primary care physician if you do not have one) for your aftercare needs so that they can reassess your need for medications and monitor your lab values. You Must read complete instructions/literature along with all the possible adverse reactions/side effects for all the Medicines you take and that have been prescribed to you. Take any new Medicines after you have completely understood and accept all the possible adverse reactions/side effects. Wear Seat belts while driving. If you have smoked or chewed Tobacco in the last 2 yrs please stop smoking and/or stop any Recreational drug use.   Increase activity slowly    Complete by:  As directed     Discharge Exam: Filed Weights   10/09/15 0125 10/09/15 2135  Weight: 120.7 kg (266 lb 1.5 oz) 117.8 kg (259 lb 9.6 oz)   Vitals:   10/09/15 2136 10/10/15 0647  BP: (!)  133/54 (!) 135/58  Pulse: 68 74  Resp: 20 18  Temp: 98.1 F (36.7 C) 98.2 F (36.8 C)   General: Appear in no distress, no Rash; Oral Mucosa moist. Cardiovascular: S1 and S2 Present, no Murmur, no JVD Respiratory: Bilateral Air entry present and Clear to Auscultation, no Crackles, n wheezes Abdomen: Bowel Sound present, Soft and no tenderness Extremities: no Pedal edema, n calf tenderness Neurology: Grossly no focal neuro deficit.  The results of significant diagnostics from this hospitalization (including imaging, microbiology, ancillary and laboratory) are listed below for reference.    Significant Diagnostic Studies: Dg Chest Port 1 View  Result Date: 10/09/2015 CLINICAL DATA:  Altered mental status.  Shortness of breath. EXAM: PORTABLE CHEST 1 VIEW COMPARISON:  08/25/2015. FINDINGS: Heart size stable. No pulmonary venous congestion noted on today's exam. Low lung volumes with mild bibasilar atelectasis. No pleural effusion or pneumothorax. IMPRESSION: 1. Stable cardiomegaly. No pulmonary venous congestion on today's exam. 2. Low lung volumes with mild bibasilar atelectasis Electronically Signed   By: Marcello Moores  Register   On: 10/09/2015 06:55    Microbiology: Recent Results (from the past 240 hour(s))  MRSA PCR Screening     Status: None   Collection Time: 10/09/15  2:01 AM  Result Value Ref Range Status   MRSA by PCR NEGATIVE NEGATIVE Final    Comment:        The GeneXpert MRSA Assay (FDA approved for NASAL specimens only), is one component of a comprehensive MRSA colonization surveillance program. It is not intended to diagnose MRSA infection nor to guide or monitor treatment for MRSA infections.      Labs: CBC:  Recent Labs Lab 10/08/15 2158 10/09/15 0207  WBC 8.1 7.6  NEUTROABS 5.0  --   HGB 10.2* 10.7*  HCT 31.9* 35.0*  MCV 83.5 84.5  PLT 295 [id number]   Basic Metabolic Panel:  Recent Labs Lab 10/08/15 2158 10/09/15 0207  NA 140 140  K 4.1 4.3  CL 109  105  CO2 27 28  GLUCOSE 146* 93  BUN 26* 24*  CREATININE 0.93 0.87  CALCIUM 8.9 9.3   Liver Function Tests:  Recent Labs Lab 10/08/15 2158  AST 58*  ALT 31  ALKPHOS 69  BILITOT 0.3  PROT 6.8  ALBUMIN 3.2*   No results for input(s): LIPASE, AMYLASE in the last 168 hours. No results for input(s): AMMONIA in the last 168 hours. Cardiac Enzymes:  Recent Labs Lab 10/08/15 2158 10/09/15 0207 10/09/15 0703 10/09/15 1351  TROPONINI <0.03 <0.03 <0.03 <0.03   BNP (last 3 results)  Recent Labs  04/29/15 1613 07/01/15 1042 10/08/15 2158  BNP 24.9 65.9 35.8   CBG:  Recent Labs Lab 10/09/15 1608 10/09/15 2013 10/10/15 0003 10/10/15 0424 10/10/15 0741  GLUCAP 138* 287* 208* 157* 169*   Time spent: 30 minutes  Signed:  Rimsha Trembley  [facility] 10/10/2015 , 8:48 AM

## 2015-10-19 ENCOUNTER — Other Ambulatory Visit: Payer: Self-pay | Admitting: *Deleted

## 2015-10-19 MED ORDER — GABAPENTIN 300 MG PO CAPS: ORAL_CAPSULE | ORAL | 2 refills | Status: DC

## 2015-10-20 DIAGNOSIS — M279 Disease of jaws, unspecified: Secondary | ICD-10-CM | POA: Diagnosis not present

## 2015-10-22 ENCOUNTER — Other Ambulatory Visit: Payer: Self-pay

## 2015-10-22 ENCOUNTER — Inpatient Hospital Stay: Payer: PPO | Admitting: Internal Medicine

## 2015-10-22 DIAGNOSIS — Z9181 History of falling: Secondary | ICD-10-CM

## 2015-10-22 NOTE — Patient Outreach (Signed)
[facility]) Care Management  10/22/2015  ECHOE ALLY Jul 22, 1945 [id number]   Referral Date:  10/19/15 Source:  [place] Issue:  "Ongoing education with blood sugars/disease and symptom management.  (TRIGGER 8/25) High Risk for Readmission - Hypoglycemia, recent admit.  Resource support -mbr scored a 7 on PHQ 2-PCP office notified." Insurance:  HTA  Subjective: Outreach call #1 to patient.  Patient reached and Screening completed.  Patient confirms she has scheduled follow-up with Primary MD today and has improved BS readings since starting the Lantus ordered on admission.  Patient undergoing dental extractions and throat cyst excision.  Reports still dealing with leg ulcers but overall feeling better.  States home health has discharged her from services.     Providers: Primary MD:  Dr. Pricilla Holm -  Next appt 10/22/15 at 2pm  Endocrinologist: New appt scheduled with Dr. Forde Dandy for October, 2017. Opthalmologist:  Last appt 12/2014 Endodontist:  Last appt 10/21/15 HH: Post hospital services completed   Social: Patient lives in home with husband.   Mobility:  Ambulates in the house via [place] and W/C outside the home.  Falls: 2 over the past year. Last fall 06/2015.   Tripped over the wheel on walker and other unknown; just went down.    Pain: yes rates 7-10 with pain medication.  Associates to neuropathy, restless legs, skin ulcers.  States Pain meds do not help that much.  Depression: no Transportation:  Husband  Caregiver: Husband  Emergency Contact: Husband and Son, Nanette Hurrell.  Advance Directive: No  But agreed to education and interested in completing.  Patient confirms husband has not completed either.  Consent:  Yes  Resources: non identified this screening call.  DME: Glucometer type unknown - 6 months old. Scales, BP cuff, oxygen ([facility]), eyeglasses.   Co-morbidities:  CHF, Essential HTN, DM type 2 with renal complications, Chronic  anemia, Bradycardia, Venous stasis ulcers of both lower extremities.  Teeth extraction (3) and throat cyst excised 10/21/15.   Admission:  (1) 10/08/15 - 10/10/15 - hypoglycemia ED:  (2) Discharge Plan included the following  -Follow-up with Primary MD:  Dr. Pricilla Holm  in one week to adjust your diabetes medication and hypertension medication  (appt scheduled for today: 8/31 at 2pm  -Establish care with endocrinology  - (New appt scheduled with Dr. Forde Dandy 11/2015) Weight 259 on 10/09/15 Patient self checks Blood sugars 3 times at day.  Averaging 140-152 or lower since discharge home.   Medications:  Patient taking more than 15 medications  Co-pay cost issues: none  Flu Vaccine: 11/05/14 Pneumovax (PPSV2): 01/16/2013 Prevnar (PCV13): 11/08/13 TDAP: 03/06/2014 Medication reconciliation completed with patient on 10/22/2015.  Objective:   Encounter Medications:  Outpatient Encounter Prescriptions as of 10/22/2015  Medication Sig Note  . aspirin EC 81 MG tablet Take 1 tablet (81 mg total) by mouth daily.   . B Complex-C (B-COMPLEX WITH VITAMIN C) tablet Take 1 tablet by mouth daily.   . carvedilol (COREG) 3.125 MG tablet Take 1 tablet (3.125 mg total) by mouth 2 (two) times daily with a meal.   . Cholecalciferol (VITAMIN D3) 2000 units capsule Take 1 capsule (2,000 Units total) by mouth daily.   . citalopram (CELEXA) 20 MG tablet TAKE 1 TABLET (20 MG TOTAL) BY MOUTH DAILY.   [name] Coenzyme Q10 200 MG capsule Take 200 mg by mouth daily.   . diclofenac sodium (VOLTAREN) 1 % GEL Apply 2 g topically daily as needed (knee pain).   . Dietary Management  Product (VASCULERA) TABS Take 1 capsule by mouth daily.   . DULoxetine (CYMBALTA) 60 MG capsule Take 1 capsule (60 mg total) by mouth daily.   [name] ezetimibe (ZETIA) 10 MG tablet Take 1 tablet (10 mg total) by mouth daily.   . fenofibrate 160 MG tablet TAKE 1 TABLET (160 MG TOTAL) BY MOUTH DAILY. MUST ESTABLISH WITH NEW PCP FOR ADDITIONAL REFILLS.   [name]  gabapentin (NEURONTIN) 300 MG capsule TAKE TWO CAPSULES BY MOUTH FOUR TIMES A DAY   . hydrALAZINE (APRESOLINE) 25 MG tablet TAKE 1 TABLET (25 MG TOTAL) BY MOUTH 3 (THREE) TIMES DAILY.   [name] HYDROcodone-acetaminophen (NORCO) 7.5-325 MG tablet Take 1 tablet by mouth every 6 (six) hours as needed for moderate pain.   . Insulin Glargine (LANTUS) 100 UNIT/ML Solostar Pen Inject 50 Units into the skin daily at 10 pm.   . insulin lispro (HUMALOG) 100 UNIT/ML KiwkPen Inject 0.08 mLs (8 Units total) into the skin 3 (three) times daily.   . insulin lispro (HUMALOG) 100 UNIT/ML KiwkPen CBG 121-150: 2 units,CBG 151-200:3 units,CBG 201-250: 5 units,CBG 251-300: 8 units,CBG 301-350: 11 units,CBG 351 - 400: 15 units.   [name] ketoconazole (NIZORAL) 2 % cream Apply 1 application topically daily. Reported on 08/17/2015   . Magnesium 500 MG TABS Take 500 mg by mouth daily.   . metolazone (ZAROXOLYN) 5 MG tablet TAKE 5mg  (1 tablet) Saturday.  Can take an additional as needed for wt gain 3lbs overnight or 5 lbs within one week.   . omega-3 acid ethyl esters (LOVAZA) 1 g capsule Take 2 capsules (2 g total) by mouth 2 (two) times daily.   . potassium chloride 20 MEQ TBCR Take 40 mEq by mouth 2 (two) times daily. With an additional 40 on Sat 10/09/2015: Not taking extra tab on saturdays  . rOPINIRole (REQUIP) 2 MG tablet Take 1 tablet (2 mg total) by mouth 3 (three) times daily as needed.   [name] spironolactone (ALDACTONE) 25 MG tablet TAKE 1 TABLET BY MOUTH DAILY   . torsemide (DEMADEX) 100 MG tablet TAKE 1 TABLET (100 MG TOTAL) BY MOUTH 2 (TWO) TIMES DAILY.   . vitamin B-12 (CYANOCOBALAMIN) 500 MCG tablet Take 500 mcg by mouth daily. Reported on 09/09/2015   . clotrimazole (MYCELEX) 10 MG troche Take 1 tablet (10 mg total) by mouth 5 (five) times daily. (Patient not taking: Reported on 10/09/2015)   . lactobacillus (FLORANEX/LACTINEX) PACK Take 1 packet (1 g total) by mouth 3 (three) times daily with meals. (Patient not taking:  Reported on 10/09/2015)   . nystatin-triamcinolone ointment (MYCOLOG) Apply 1 application topically 2 (two) times daily. (Patient not taking: Reported on 10/09/2015)    No facility-administered encounter medications on file as of 10/22/2015.     Functional Status:  In your present state of health, do you have any difficulty performing the following activities: 10/22/2015 10/09/2015  Hearing? Tempie Donning  Vision? N N  Difficulty concentrating or making decisions? N N  Walking or climbing stairs? Y Y  Dressing or bathing? Y Y  Doing errands, shopping? Tempie Donning  Preparing Food and eating ? N -  Using the Toilet? N -  In the past six months, have you accidently leaked urine? N -  Do you have problems with loss of bowel control? N -  Managing your Medications? N -  Managing your Finances? N -  Housekeeping or managing your Housekeeping? Y -  Some recent data might be hidden    Fall/Depression Screening: PHQ  2/9 Scores 10/22/2015 05/15/2015 02/10/2015 12/02/2014 08/20/2014 08/15/2014 07/08/2014  PHQ - 2 Score 0 0 0 0 0 0 1  PHQ- 9 Score - - - - - - -    Fall Risk  10/22/2015 05/15/2015 02/10/2015 12/02/2014 08/15/2014  Falls in the past year? Yes No No No No  Number falls in past yr: 2 or more - - - -  Injury with Fall? No - - - -  Risk Factor Category  High Fall Risk - - - -  Risk for fall due to : History of fall(s);Impaired balance/gait;Impaired mobility;Medication side effect;Other (Comment) - Impaired mobility Impaired balance/gait;Impaired mobility;Impaired vision;Medication side effect Impaired balance/gait;Medication side effect  Risk for fall due to (comments): Neuropathy, restless legs and pain - discussed fall prevention strategies, active with home health physical therapy. - -  Follow up Falls evaluation completed;Education provided;Falls prevention discussed - - - -    Preventives: Hearing: last exam around 2015.  Patient went deaf in left hear and advised nothing could be done for it.  Eyes:   Dr. Talbert Forest last appt 12/2014.  Dentist:  Dr. Dorann Lodge  - last appt 07/2015.  Endodontist:  Dr. Lemmie Evens. [place] - Pulled 3 tooth and cyst out the throat 10/21/15. Podiatrist: [place] off [address]- 09/09/15 cancelled August appt due to last appt made an ingrown toenail worse.  Toe improved after patient worked on it for a month.   Mammogram: 11/30/2009 Colonoscopy: 12/03/12   Assessment: High Risk for Re-admission due to recent admission for DM management and currently undergoing dental procedures.   High Risk for falls due to H/o falls, decreased mobility and pain.  Deaf Left ear.     Plan:   [place] RN CM Referral  -Hospital admission within the last 30 days.  -A1C 9.7  [facility] Referral -more than 15 medications -Started new DM medications on recent admission.   [facility] Social Work Referral -Regulatory affairs officer (counseling / education on process for completing).  RN CM mailed (2) copies to patient on 10/22/2015.   RN CM advised patient in next [facility] scheduled contact call within the next 10 business days.   RN CM advised to please notify MD of any changes in condition prior to scheduled appt's.   RN CM provided contact name and # [phone] or main office # [phone] and 24-hour nurse line # [phone].  RN CM confirmed patient is aware of 911 services for urgent emergency needs.  Nathaneil Canary, BSN, RN, [place] Care Management Care Management Coordinator [phone] Direct [phone] Cell [phone] Office [phone] Fax [email or web address]

## 2015-10-23 ENCOUNTER — Other Ambulatory Visit: Payer: Self-pay | Admitting: Licensed Clinical Social Worker

## 2015-10-23 ENCOUNTER — Encounter: Payer: Self-pay | Admitting: Internal Medicine

## 2015-10-23 ENCOUNTER — Ambulatory Visit (INDEPENDENT_AMBULATORY_CARE_PROVIDER_SITE_OTHER): Payer: PPO | Admitting: Internal Medicine

## 2015-10-23 DIAGNOSIS — E162 Hypoglycemia, unspecified: Secondary | ICD-10-CM

## 2015-10-23 DIAGNOSIS — Z794 Long term (current) use of insulin: Secondary | ICD-10-CM

## 2015-10-23 DIAGNOSIS — I5032 Chronic diastolic (congestive) heart failure: Secondary | ICD-10-CM

## 2015-10-23 DIAGNOSIS — I10 Essential (primary) hypertension: Secondary | ICD-10-CM | POA: Diagnosis not present

## 2015-10-23 DIAGNOSIS — Z23 Encounter for immunization: Secondary | ICD-10-CM | POA: Diagnosis not present

## 2015-10-23 DIAGNOSIS — E1122 Type 2 diabetes mellitus with diabetic chronic kidney disease: Secondary | ICD-10-CM | POA: Diagnosis not present

## 2015-10-23 DIAGNOSIS — N184 Chronic kidney disease, stage 4 (severe): Secondary | ICD-10-CM

## 2015-10-23 MED ORDER — GLUCOSE BLOOD VI STRP: ORAL_STRIP | 12 refills | Status: DC

## 2015-10-23 MED ORDER — ONETOUCH VERIO VI SOLN: 12 refills | Status: DC

## 2015-10-23 NOTE — Patient Outreach (Signed)
[place]) Care Management  10/23/2015  Melissa Myers Sep 08, 1945 [id number]   Assessment- CSW completed initial outreach after receiving new referral on patient for providing advance directive information and instruction. CSW was unable to reach patient but left a HIPPA compliant voice message encouraging return phone call.  Plan-CSW will complete next outreach within 7 days.  Eula Fried, BSW, [email or web address], [email or web address] Phone: [phone] Fax: [phone]

## 2015-10-23 NOTE — Progress Notes (Signed)
   Subjective:    Patient ID: Melissa Myers, female    DOB: 1945-05-08, 70 y.o.   MRN: [id number]  HPI The patient is a 70 YO female coming in for hospital follow up (in with hypoglycemia, treated with food and sugar, regimen changed in the hospital for her sugars). She is taking novolin 70/30 100 units once a day (mostly) and taking lantus 50 units at 10 pm. The insurance would not cover the humalog. She has not been skipping meals. She denies hypoglycemia. Her husband has taken over the injections as they make more sense to him. She denies chest pains, SOB, abdominal pain, diarrhea or constipation. She is still having leg swelling which is unchanged.   PMH, [facility], social history reviewed and updated.   Review of Systems  Constitutional: Positive for activity change. Negative for appetite change, fatigue, fever and unexpected weight change.  HENT: Negative.   Eyes: Negative.   Respiratory: Negative for cough, chest tightness, shortness of breath and wheezing.   Cardiovascular: Positive for leg swelling. Negative for chest pain and palpitations.  Gastrointestinal: Negative for abdominal distention, abdominal pain, constipation, diarrhea and nausea.  Genitourinary: Negative for hematuria and urgency.       Incontinence  Musculoskeletal: Positive for arthralgias, gait problem and myalgias.  Neurological: Positive for weakness and numbness. Negative for dizziness and light-headedness.  Psychiatric/Behavioral: Negative.       Objective:   Physical Exam  Constitutional: She is oriented to person, place, and time. She appears well-developed and well-nourished.  HENT:  Head: Normocephalic and atraumatic.  Eyes: EOM are normal.  Neck: Normal range of motion.  Cardiovascular: Normal rate and regular rhythm.   Pulmonary/Chest: Effort normal and breath sounds normal. No respiratory distress. She has no wheezes. She has no rales.  Abdominal: Soft. Bowel sounds are normal. She exhibits no  distension. There is no tenderness. There is no rebound.  Musculoskeletal: She exhibits edema.  1-2+ edema bilateral legs   Neurological: She is alert and oriented to person, place, and time. Coordination abnormal.  Wheelchair bound  Skin: Skin is warm and dry.   Vitals:   10/23/15 1414  BP: 132/60  Pulse: 68  Resp: 16  Temp: 98.5 F (36.9 C)  TempSrc: Oral  SpO2: 94%  Height: 4\' 10"  (1.473 m)      Assessment & Plan:  High dose flu shot given at visit.

## 2015-10-23 NOTE — Assessment & Plan Note (Signed)
Swelling and volume status stable today on her current spironolactone and torsemide.

## 2015-10-23 NOTE — Patient Instructions (Signed)
We have given you the flu shot today.  It is okay to keep taking the lantus 50 units at 10 PM and the 100 units of the novolin 70/30 in the morning.   I do not want you to take any of the 70/30 novolin in the evening as this has some of the same medicine as the lantus and can make the sugars low.

## 2015-10-23 NOTE — Assessment & Plan Note (Signed)
BP at goal on spironolactone and torsemide and hydralazine and coreg. She does have complication of CKD stage 4.

## 2015-10-23 NOTE — Assessment & Plan Note (Signed)
No episodes since leaving the hospital and they are not following the instructions from the hospital. They did not get humalog since the insurance did not cover it. They are not taking regimen correctly and have modified for them today. New rx for strips sent in today.

## 2015-10-23 NOTE — Assessment & Plan Note (Signed)
She is advised not to take the novolin 70/30 BID in addition to the lantus 50 units at night time. They do not want to switch insulins at this time due to the fact that she is doing well. They do not have sugar log with this. Visit with new endo in October. They are asked to take only 100 units 70/30 with food in the morning and 50 units lantus at night time.

## 2015-10-23 NOTE — Progress Notes (Signed)
Pre visit review using our clinic review tool, if applicable. No additional management support is needed unless otherwise documented below in the visit note. 

## 2015-10-27 ENCOUNTER — Ambulatory Visit (INDEPENDENT_AMBULATORY_CARE_PROVIDER_SITE_OTHER): Payer: PPO | Admitting: Cardiovascular Disease

## 2015-10-27 ENCOUNTER — Encounter: Payer: Self-pay | Admitting: Cardiovascular Disease

## 2015-10-27 ENCOUNTER — Telehealth: Payer: Self-pay | Admitting: Endocrinology

## 2015-10-27 ENCOUNTER — Encounter: Payer: Self-pay | Admitting: Licensed Clinical Social Worker

## 2015-10-27 ENCOUNTER — Other Ambulatory Visit: Payer: Self-pay | Admitting: Licensed Clinical Social Worker

## 2015-10-27 ENCOUNTER — Other Ambulatory Visit: Payer: Self-pay

## 2015-10-27 VITALS — BP 100/50 | HR 57 | Ht <= 58 in | Wt 255.0 lb

## 2015-10-27 DIAGNOSIS — I739 Peripheral vascular disease, unspecified: Secondary | ICD-10-CM | POA: Diagnosis not present

## 2015-10-27 DIAGNOSIS — I5032 Chronic diastolic (congestive) heart failure: Secondary | ICD-10-CM | POA: Diagnosis not present

## 2015-10-27 NOTE — Patient Outreach (Signed)
[place] [facility]) Care Management  10/27/2015  CHANDAL LARE [birth year] [id number]   Assessment: 70 year old with recent admission 8/17-8/19 due to hypoglycemia. RNCM received referral today. Initial screened by telephonic Care Coordinator. RNCM called to follow up. Member report she is doing better. Mrs. Kaina states her husband has been assisting her in her care. Member has seen primary care physician since discharge. RNCM reviewed primary care physician's discharge instructions with her including medications. Member appears to have a clear understanding of how she is taking her medication.   Per Policy, notification of recent hospitalization has been within 30 days, therefore member will be placed in a transition of care program/diabetes program.  RNCM provided contact number. RNCM also reinforced the 24hour nurse advice line contact information.  Plan: follow for weekly engagements. home visit.  Thea Silversmith, RN, MSN, [place] Coordinator Cell: [phone]

## 2015-10-27 NOTE — Progress Notes (Signed)
Cardiology Office Note   Date:  10/27/2015   ID:  Melissa Myers, DOB 08-20-1945, MRN [id number]  PCP:  [name], MD  Cardiologist:  Dr. Aundra Dubin  Chief Complaint  Patient presents with  . PAD    pt c/o pain both feet, worse in the left      History of Present Illness: Melissa Myers is a 70 y.o. female who Was referred by Dr. Aundra Dubin for evaluation and management of peripheral arterial disease. She has multiple chronic medical conditions that include chronic diastolic heart failure, hypertension, hyperlipidemia, diabetes mellitus, obstructive sleep apnea and morbid obesity. Her She has known history of severe peripheral neuropathy from diabetes with severe foot pain and numbness. Her physical capacity is extremely limited due to that and she walks slowly with a walker and many times she has to use a wheelchair. She is also limited by dyspnea with no chest pain. She has no lower extremity ulceration. She was noted to have diminished distal pulses. She underwent noninvasive vascular evaluation which showed normal ABI but she was noted to have monophasic waveform suggestive of inflow disease. Aortoiliac duplex showed borderline significant bilateral iliac disease with peak velocities in the low 300 range.   Past Medical History:  Diagnosis Date  . Anemia   . Cellulitis    LOWER EXTREMITIES  . Chronic diarrhea    a/w nausea - felt related to IBS  . Deaf    left side only  . Diastolic CHF ([place])   . Disc degeneration, lumbar   . GERD (gastroesophageal reflux disease)   . Hyperlipidemia    hx rhabdo on statins  . Hypertension   . Neuropathy (HCC)    feet, toes and fingers  . On home oxygen therapy    uses oxygen 2 liters min per [place] at night and prn during day  . OSA (obstructive sleep apnea)    05/2009 sleep study - refuses CPAP  . Osteoarthritis   . RLS (restless legs syndrome)   . Shortness of breath    chronic  . Stasis dermatitis   . Type II or  unspecified type diabetes mellitus without mention of complication, not stated as uncontrolled    insulin dep    Past Surgical History:  Procedure Laterality Date  . CHOLECYSTECTOMY  1997  . COLONOSCOPY N/A 12/03/2012   Procedure: COLONOSCOPY;  Surgeon: Lafayette Dragon, MD;  Location: [facility] ENDOSCOPY;  Service: Endoscopy;  Laterality: N/A;  . TONSILLECTOMY  1970  . TUBAL LIGATION  1980  . UMBILICAL HERNIA REPAIR  1995  . uterine polyp removal  2008     Current Outpatient Prescriptions  Medication Sig Dispense Refill  . aspirin EC 81 MG tablet Take 1 tablet (81 mg total) by mouth daily.    . B Complex-C (B-COMPLEX WITH VITAMIN C) tablet Take 1 tablet by mouth daily.    . Blood Glucose Calibration (ONETOUCH VERIO) SOLN Use to check sugars up to 4 times per day 1 each 12  . carvedilol (COREG) 3.125 MG tablet Take 1 tablet (3.125 mg total) by mouth 2 (two) times daily with a meal. 60 tablet 0  . Cholecalciferol (VITAMIN D3) 2000 units capsule Take 1 capsule (2,000 Units total) by mouth daily. 90 capsule 3  . citalopram (CELEXA) 20 MG tablet TAKE 1 TABLET (20 MG TOTAL) BY MOUTH DAILY. 30 tablet 2  . clotrimazole (MYCELEX) 10 MG troche Take 1 tablet (10 mg total) by mouth 5 (five) times daily. [place]  tablet 2  . Coenzyme Q10 200 MG capsule Take 200 mg by mouth daily.    . diclofenac sodium (VOLTAREN) 1 % GEL Apply 2 g topically daily as needed (knee pain). 10 g 3  . DULoxetine (CYMBALTA) 60 MG capsule Take 1 capsule (60 mg total) by mouth daily. 90 capsule 1  . ezetimibe (ZETIA) 10 MG tablet Take 1 tablet (10 mg total) by mouth daily. 90 tablet 0  . fenofibrate 160 MG tablet TAKE 1 TABLET (160 MG TOTAL) BY MOUTH DAILY. MUST ESTABLISH WITH NEW PCP FOR ADDITIONAL REFILLS. 90 tablet 3  . gabapentin (NEURONTIN) 300 MG capsule TAKE TWO CAPSULES BY MOUTH FOUR TIMES A DAY 240 capsule 2  . glucose blood (ONETOUCH VERIO) test strip Use as instructed up to 4 times per day 200 each 12  . hydrALAZINE  (APRESOLINE) 25 MG tablet TAKE 1 TABLET (25 MG TOTAL) BY MOUTH 3 (THREE) TIMES DAILY. 90 tablet 2  . HYDROcodone-acetaminophen (NORCO) 7.5-325 MG tablet Take 1 tablet by mouth every 6 (six) hours as needed for moderate pain. 120 tablet 0  . Insulin Glargine (LANTUS) 100 UNIT/ML Solostar Pen Inject 50 Units into the skin daily at 10 pm. 15 mL 0  . insulin NPH-regular Human (NOVOLIN 70/30) (70-30) 100 UNIT/ML injection Inject 100 Units into the skin daily with breakfast.    . Magnesium 500 MG TABS Take 500 mg by mouth daily.    . metolazone (ZAROXOLYN) 5 MG tablet TAKE 5mg  (1 tablet) Saturday.  Can take an additional as needed for wt gain 3lbs overnight or 5 lbs within one week. 15 tablet 3  . omega-3 acid ethyl esters (LOVAZA) 1 g capsule Take 2 capsules (2 g total) by mouth 2 (two) times daily. 270 capsule 3  . potassium chloride 20 MEQ TBCR Take 40 mEq by mouth 2 (two) times daily. With an additional 40 on Sat 145 tablet 6  . rOPINIRole (REQUIP) 2 MG tablet Take 1 tablet (2 mg total) by mouth 3 (three) times daily as needed. 270 tablet 3  . spironolactone (ALDACTONE) 25 MG tablet TAKE 1 TABLET BY MOUTH DAILY 30 tablet 2  . torsemide (DEMADEX) 100 MG tablet TAKE 1 TABLET (100 MG TOTAL) BY MOUTH 2 (TWO) TIMES DAILY. 60 tablet 5  . vitamin B-12 (CYANOCOBALAMIN) 500 MCG tablet Take 500 mcg by mouth daily. Reported on 09/09/2015     No current facility-administered medications for this visit.     Allergies:   Cymbalta [duloxetine hcl]; Statins; Sulfa antibiotics; Levemir [insulin detemir]; Morphine; and Zinc    Social History:  The patient  reports that she has never smoked. She has never used smokeless tobacco. She reports that she does not drink alcohol or use drugs.   Family History:  The patient's family history includes Heart attack (age of onset: 62) in her mother; Heart attack (age of onset: 74) in her father; Heart disease in her father and mother; Lung cancer in her paternal grandfather;  Stomach cancer in her paternal grandmother.    ROS:  Please see the history of present illness.   Otherwise, review of systems are positive for none.   All other systems are reviewed and negative.    PHYSICAL EXAM: VS:  BP (!) 100/50   Pulse (!) 57   Ht 4\' 10"  (1.473 m)   Wt 255 lb (115.7 kg)   BMI 53.30 kg/m  , BMI Body mass index is 53.3 kg/m. GEN: Well nourished, well developed, in no acute distress  HEENT: normal  Neck: no JVD, carotid bruits, or masses Cardiac: RRR; no murmurs, rubs, or gallops, +1 edema  Respiratory:  clear to auscultation bilaterally, normal work of breathing GI: soft, nontender, nondistended, + BS MS: no deformity or atrophy  Skin: warm and dry, no rash Neuro:  Strength and sensation are intact Psych: euthymic mood, full affect   EKG:  EKG is not ordered today.    Recent Labs: 04/16/2015: Magnesium 1.9 05/04/2015: Pro B Natriuretic peptide (BNP) 25.0 10/08/2015: ALT 31; B Natriuretic Peptide 35.8 10/09/2015: BUN 24; Creatinine, Ser 0.87; Hemoglobin 10.7; Platelets 352; Potassium 4.3; Sodium 140; TSH 1.606    Lipid Panel    Component Value Date/Time   CHOL 250 (H) 07/01/2015 1041   TRIG 500 (H) 07/01/2015 1041   TRIG 232 12/02/2009   HDL 36 (L) 07/01/2015 1041   CHOLHDL 6.9 07/01/2015 1041   VLDL UNABLE TO CALCULATE IF TRIGLYCERIDE OVER 400 mg/dL 07/01/2015 1041   LDLCALC UNABLE TO CALCULATE IF TRIGLYCERIDE OVER 400 mg/dL 07/01/2015 1041   LDLDIRECT 108.0 07/08/2014 1229      Wt Readings from Last 3 Encounters:  10/27/15 255 lb (115.7 kg)  10/22/15 259 lb (117.5 kg)  10/09/15 259 lb 9.6 oz (117.8 kg)       No flowsheet data found.    ASSESSMENT AND PLAN:  1.   Peripheral arterial disease: The patient seems to have moderate iliac disease bilaterally but it's really difficult to determine if she has claudication or not. Her ABI was overall normal. A lot of her symptoms seem to be related to severe peripheral neuropathy. Her functional  capacity is very limited. I don't think her iliac disease is severe enough and also even with successful revascularization, I don't think it would have significant implication on her functional capacity which seems to be limited due to other reasons. I recommend continuing medical therapy for now and reserving revascularization for worsening ischemia. Continue treatment of risk factors.  2. Chronic diastolic heart failure: She seems to be euvolemic.   Disposition:   FU with me as needed  Signed,  Kathlyn Sacramento, MD  10/27/2015 9:54 AM    [place]

## 2015-10-27 NOTE — Patient Instructions (Signed)
Medication Instructions:  .Your physician recommends that you continue on your current medications as directed. Please refer to the Current Medication list given to you today.  Labwork: none  Testing/Procedures: none  Follow-Up: As needed   

## 2015-10-27 NOTE — Telephone Encounter (Signed)
please call patient: Who are you going to see for your diabetes in the future?  Please let me know

## 2015-10-27 NOTE — Patient Outreach (Signed)
[place] [facility]) Care Management  10/27/2015  Melissa Myers 1946-01-21 [id number]   Assessment- CSW completed second outreach to patient and patient answered successfully. HIPPA verifications provided. Patient is agreeable to social work assistance. Patient needs assistance with completing advance directives. Patient is agreeable to home visit on 10/29/15. Patient denies any further social work needs.  Plan-CSW will send involvement letter to PCP and complete home visit this week.   [facility] CM Care Plan Problem One   Flowsheet Row Most Recent Value  Care Plan Problem One  Lack of Advance Directives  Role Documenting the Problem One  Clinical Social Worker  Care Plan for Problem One  Active  THN CM Short Term Goal #1 (0-30 days)  Patient will complete advance directives within 30 days and be educated on how to get it notarized after completion due to not having advance directives.  THN CM Short Term Goal #1 Start Date  10/27/15  Interventions for Short Term Goal #1  CSW will complete home visit within one week and assist patient with completing document, providing education on the importance of the document and directions on how to get it notarized.      Eula Fried, BSW, MSW, [email or web address] Phone: [phone] Fax: [phone]

## 2015-10-28 DIAGNOSIS — M279 Disease of jaws, unspecified: Secondary | ICD-10-CM | POA: Diagnosis not present

## 2015-10-28 NOTE — Telephone Encounter (Signed)
I contacted the patient and she has moved her appointment up till 11/04/2015. Patient confirmed she has enough insulin to last until her appointment.

## 2015-10-28 NOTE — Telephone Encounter (Signed)
Please move up appt to next available 

## 2015-10-28 NOTE — Telephone Encounter (Signed)
I contacted the patient and advised of message. Patient became confused, she stated she is still following up with you for her diabetes. Please advise on how to proceed. Thanks!

## 2015-10-29 ENCOUNTER — Other Ambulatory Visit: Payer: Self-pay | Admitting: Licensed Clinical Social Worker

## 2015-10-29 ENCOUNTER — Encounter: Payer: Self-pay | Admitting: Licensed Clinical Social Worker

## 2015-10-29 NOTE — Patient Outreach (Signed)
[place] [facility]) Care Management  [facility] Social Work  10/29/2015  Melissa Myers August 02, 1945 [id number]  Encounter Medications:  Outpatient Encounter Prescriptions as of 10/29/2015  Medication Sig Note  . aspirin EC 81 MG tablet Take 1 tablet (81 mg total) by mouth daily.   . B Complex-C (B-COMPLEX WITH VITAMIN C) tablet Take 1 tablet by mouth daily.   . Blood Glucose Calibration (ONETOUCH VERIO) SOLN Use to check sugars up to 4 times per day   . carvedilol (COREG) 3.125 MG tablet Take 1 tablet (3.125 mg total) by mouth 2 (two) times daily with a meal.   . Cholecalciferol (VITAMIN D3) 2000 units capsule Take 1 capsule (2,000 Units total) by mouth daily.   . citalopram (CELEXA) 20 MG tablet TAKE 1 TABLET (20 MG TOTAL) BY MOUTH DAILY.   . clotrimazole (MYCELEX) 10 MG troche Take 1 tablet (10 mg total) by mouth 5 (five) times daily.   . Coenzyme Q10 200 MG capsule Take 200 mg by mouth daily.   . diclofenac sodium (VOLTAREN) 1 % GEL Apply 2 g topically daily as needed (knee pain).   . DULoxetine (CYMBALTA) 60 MG capsule Take 1 capsule (60 mg total) by mouth daily.   [name] ezetimibe (ZETIA) 10 MG tablet Take 1 tablet (10 mg total) by mouth daily.   . fenofibrate 160 MG tablet TAKE 1 TABLET (160 MG TOTAL) BY MOUTH DAILY. MUST ESTABLISH WITH NEW PCP FOR ADDITIONAL REFILLS.   [name] gabapentin (NEURONTIN) 300 MG capsule TAKE TWO CAPSULES BY MOUTH FOUR TIMES A DAY   . glucose blood (ONETOUCH VERIO) test strip Use as instructed up to 4 times per day   . hydrALAZINE (APRESOLINE) 25 MG tablet TAKE 1 TABLET (25 MG TOTAL) BY MOUTH 3 (THREE) TIMES DAILY.   [name] HYDROcodone-acetaminophen (NORCO) 7.5-325 MG tablet Take 1 tablet by mouth every 6 (six) hours as needed for moderate pain.   . Insulin Glargine (LANTUS) 100 UNIT/ML Solostar Pen Inject 50 Units into the skin daily at 10 pm.   . insulin NPH-regular Human (NOVOLIN 70/30) (70-30) 100 UNIT/ML injection Inject 100 Units into the skin daily with  breakfast.   . Magnesium 500 MG TABS Take 500 mg by mouth daily.   . metolazone (ZAROXOLYN) 5 MG tablet TAKE 67m (1 tablet) Saturday.  Can take an additional as needed for wt gain 3lbs overnight or 5 lbs within one week.   . omega-3 acid ethyl esters (LOVAZA) 1 g capsule Take 2 capsules (2 g total) by mouth 2 (two) times daily.   . potassium chloride 20 MEQ TBCR Take 40 mEq by mouth 2 (two) times daily. With an additional 40 on Sat 10/09/2015: Not taking extra tab on saturdays  . rOPINIRole (REQUIP) 2 MG tablet Take 1 tablet (2 mg total) by mouth 3 (three) times daily as needed.   .Marland Kitchenspironolactone (ALDACTONE) 25 MG tablet TAKE 1 TABLET BY MOUTH DAILY   . torsemide (DEMADEX) 100 MG tablet TAKE 1 TABLET (100 MG TOTAL) BY MOUTH 2 (TWO) TIMES DAILY.   . vitamin B-12 (CYANOCOBALAMIN) 500 MCG tablet Take 500 mcg by mouth daily. Reported on 09/09/2015    No facility-administered encounter medications on file as of 10/29/2015.     Functional Status:  In your present state of health, do you have any difficulty performing the following activities: 10/22/2015 10/09/2015  Hearing? YTempie Donning Vision? N N  Difficulty concentrating or making decisions? N N  Walking or climbing stairs? YTempie Donning  Dressing or bathing? Y Y  Doing errands, shopping? Tempie Donning  Preparing Food and eating ? N -  Using the Toilet? N -  In the past six months, have you accidently leaked urine? N -  Do you have problems with loss of bowel control? N -  Managing your Medications? N -  Managing your Finances? N -  Housekeeping or managing your Housekeeping? Y -  Some recent data might be hidden    Fall/Depression Screening:  PHQ 2/9 Scores 10/29/2015 10/22/2015 05/15/2015 02/10/2015 12/02/2014 08/20/2014 08/15/2014  PHQ - 2 Score 0 0 0 0 0 0 0  PHQ- 9 Score - - - - - - -    Assessment: CSW completed initial home visit in order to assist patient with completing advance directives. Patient was on lying on the bed moaning. Patient's right and left feet  are extremely swollen and red. Patient reports that she is in excruciating pain. She has 10 different ointments and pain relief lotions that her husband puts on her legs and feet. She states that the pain medication she is on if not helping. CSW can obviously see how much pain she is in but patient does not wish for this CSW to complete a home visit on a different day. Patient reports her pain as being a "burning and tingling" sensation. She reports that her 78 year old mother is currently at [facility] receiving skilled nursing and will be coming back home tomorrow. Patient reports that her husband is "a wonderful man and takes care of me." She denies any transportation, financial, mental health issues. She denies any depressive symptoms but states "I can get moody when my pain is bothering me." She states that she owns her residence. She wishes to complete advance directive today. CSW provided patient with two blank copies for her to provide to her spouse. Spouse assisted CSW with finding out their son's address to put on advance directives. Patient completed advance directives. Education provided on how important this document is and directions on how to get it notarized. Family wish to get document notarized at their bank [facility]. Patient was encouraged to make copies and provide to her doctors. Patient denies any further social work needs and is agreeable to social work discharge at this time since all goals have been met.   [facility] CM Care Plan Problem One   Flowsheet Row Most Recent Value  Care Plan Problem One  Lack of Advance Directives  Role Documenting the Problem One  Clinical Social Worker  Care Plan for Problem One  Active  THN CM Short Term Goal #1 (0-30 days)  Patient will complete advance directives within 30 days and be educated on how to get it notarized after completion due to not having advance directives.  THN CM Short Term Goal #1 Start Date  10/27/15  [facility] CM Short Term Goal #1 Met  Date  10/29/15  Interventions for Short Term Goal #1  Goal met. Patient will need to get document notarized now.      Plan: CSW will update RNCM on social work discharge. CSW will route encounter to PCP and send discipline discharge letter.   Eula Fried, BSW, MSW, [email or web address] Phone: [phone] Fax: [phone]

## 2015-10-31 ENCOUNTER — Telehealth: Payer: Self-pay | Admitting: Endocrinology

## 2015-10-31 NOTE — Telephone Encounter (Signed)
I called pt today.  We discussed.  Pt says she will keep appt with Dr Forde Dandy, so we'll cancel appt for 11/04/15.  Pt agrees.

## 2015-11-02 ENCOUNTER — Encounter (HOSPITAL_COMMUNITY): Payer: PPO

## 2015-11-02 NOTE — Telephone Encounter (Signed)
Up coming appointments cancelled.

## 2015-11-03 ENCOUNTER — Telehealth: Payer: Self-pay | Admitting: Internal Medicine

## 2015-11-03 ENCOUNTER — Other Ambulatory Visit: Payer: Self-pay

## 2015-11-03 ENCOUNTER — Ambulatory Visit: Payer: Self-pay

## 2015-11-03 NOTE — Telephone Encounter (Signed)
I spoke with [place] and advised her. She asked me to call patient.

## 2015-11-03 NOTE — Telephone Encounter (Signed)
Called patient and spoke with patient's husband. I advised him not to give her the pain medicine every 3 hours. He said he would not give it to her anymore. He is going to schedule an office visit and patient will bring in her sugar log.

## 2015-11-03 NOTE — Telephone Encounter (Signed)
Patient does have appointment in the morning.  Concerned about lower extremities swelling and redness.  Requesting call back before appt.

## 2015-11-03 NOTE — Telephone Encounter (Signed)
She should not be getting the pain medicine every 3 hours. This can be dangerous and can cause sleepiness and problems. Needs to go back to as prescribed with the pain medication which is every 6 hours. What insulin is she taking? Is she eating well? Recommend visit given the complexity. They need to bring her sugar log and know what and how much insulin she is taking.

## 2015-11-03 NOTE — Telephone Encounter (Signed)
Office Visit made for 9/13 9am thanks.

## 2015-11-03 NOTE — Patient Outreach (Signed)
[place] [facility]) Care Management  11/03/2015  KALIEGH ARMENT 1945-09-01 [id number]  Assessment: 70 year old with recent admission 8/17-8/19 due to hypoglycemia. Home visit was scheduled for today, RNCM called prior to visit. Spoke with member who states, "If it were any other day I would say to come". Member reports having cramps in her legs. While RNCM was talking it became quite. Mr. Nagasawa got on the phone and stated member had fallen asleep, adding this was the first time in about three days that she has been able to sleep.  Mr. Costea states that member has not been able to sleep for past three days due to pain in her feet. He states she is not complaining of her feet anymore, but cramps in her legs is the source of her pain now. Mr. Bryers states that member has been hurting so bad for so long that he started giving her pain medication(hydrocodone/acetaminophen) about every three hours. He reports she is on potassium, but got choked this morning on the potassium and threw it up.  Mr. Keigley reports member's Blood sugar 371 this morning. He reports he is not sure what to do about her high blood sugar levels. He states she is taking 50 units of lantus at night and 100 units of 70/30 in the morning. Per Mr. Izola Price, member had an appointment scheduled with Dr. Loanne Drilling, but it was canceled. Member has appointment with Dr. Forde Dandy in [place] patient.  RNCM called to inform primary care of signs/symptoms above. Received return call from Amy at Dr. Charlynne Cousins Office. Amy to call member/husband to discuss and recommend office visit.  RNCM called nurse with [facility] to update. Message left requesting a call back to [facility].  Plan: continue to follow.    Thea Silversmith, RN, MSN, [place] Coordinator Cell: [phone]

## 2015-11-03 NOTE — Telephone Encounter (Signed)
Nurse from [facility] called to report Melissa Myers's symptoms. Pt seem to be sleepy,has pain/cramp on legs,unble to sleep 3 days from this pain. Spouse has been giving pain every 3 hrs, BS 371 this morning (per spouse she doesn't have endo doc anymore). The nurse is very concern and want to see what Dr. Sharlet Salina wants to do.

## 2015-11-04 ENCOUNTER — Ambulatory Visit: Payer: PPO | Admitting: Endocrinology

## 2015-11-04 ENCOUNTER — Other Ambulatory Visit (INDEPENDENT_AMBULATORY_CARE_PROVIDER_SITE_OTHER): Payer: PPO

## 2015-11-04 ENCOUNTER — Encounter: Payer: Self-pay | Admitting: Internal Medicine

## 2015-11-04 ENCOUNTER — Telehealth: Payer: Self-pay | Admitting: Emergency Medicine

## 2015-11-04 ENCOUNTER — Ambulatory Visit (INDEPENDENT_AMBULATORY_CARE_PROVIDER_SITE_OTHER): Payer: PPO | Admitting: Internal Medicine

## 2015-11-04 VITALS — BP 88/64 | HR 72 | Temp 98.5°F | Resp 12

## 2015-11-04 DIAGNOSIS — Z794 Long term (current) use of insulin: Secondary | ICD-10-CM

## 2015-11-04 DIAGNOSIS — N184 Chronic kidney disease, stage 4 (severe): Secondary | ICD-10-CM

## 2015-11-04 DIAGNOSIS — D649 Anemia, unspecified: Secondary | ICD-10-CM | POA: Diagnosis not present

## 2015-11-04 DIAGNOSIS — G894 Chronic pain syndrome: Secondary | ICD-10-CM

## 2015-11-04 DIAGNOSIS — F05 Delirium due to known physiological condition: Secondary | ICD-10-CM

## 2015-11-04 DIAGNOSIS — E1122 Type 2 diabetes mellitus with diabetic chronic kidney disease: Secondary | ICD-10-CM

## 2015-11-04 DIAGNOSIS — I5032 Chronic diastolic (congestive) heart failure: Secondary | ICD-10-CM | POA: Diagnosis not present

## 2015-11-04 DIAGNOSIS — Z9181 History of falling: Secondary | ICD-10-CM

## 2015-11-04 DIAGNOSIS — M79672 Pain in left foot: Secondary | ICD-10-CM

## 2015-11-04 LAB — CBC
HEMATOCRIT: 37.1 % (ref 36.0–46.0)
HEMOGLOBIN: 12.4 g/dL (ref 12.0–15.0)
MCHC: 33.4 g/dL (ref 30.0–36.0)
MCV: 79.7 fl (ref 78.0–100.0)
PLATELETS: 375 10*3/uL (ref 150.0–400.0)
RBC: 4.66 Mil/uL (ref 3.87–5.11)
RDW: 16.1 % — AB (ref 11.5–15.5)
WBC: 11.8 10*3/uL — AB (ref 4.0–10.5)

## 2015-11-04 LAB — BRAIN NATRIURETIC PEPTIDE: Pro B Natriuretic peptide (BNP): 20 pg/mL (ref 0.0–100.0)

## 2015-11-04 LAB — COMPREHENSIVE METABOLIC PANEL
ALBUMIN: 3.6 g/dL (ref 3.5–5.2)
ALK PHOS: 96 U/L (ref 39–117)
ALT: 21 U/L (ref 0–35)
AST: 33 U/L (ref 0–37)
BILIRUBIN TOTAL: 0.3 mg/dL (ref 0.2–1.2)
BUN: 56 mg/dL — AB (ref 6–23)
CO2: 37 mEq/L — ABNORMAL HIGH (ref 19–32)
Calcium: 9.6 mg/dL (ref 8.4–10.5)
Chloride: 90 mEq/L — ABNORMAL LOW (ref 96–112)
Creatinine, Ser: 1.68 mg/dL — ABNORMAL HIGH (ref 0.40–1.20)
GFR: 31.98 mL/min — AB (ref >60.00)
Glucose, Bld: 213 mg/dL — ABNORMAL HIGH (ref 70–99)
Potassium: 4.2 mEq/L (ref 3.5–5.1)
Sodium: 138 mEq/L (ref 135–145)
TOTAL PROTEIN: 7.4 g/dL (ref 6.0–8.3)

## 2015-11-04 LAB — URINALYSIS, ROUTINE W REFLEX MICROSCOPIC
Bilirubin Urine: NEGATIVE
Ketones, ur: NEGATIVE
Nitrite: NEGATIVE
SPECIFIC GRAVITY, URINE: 1.015 (ref 1.000–1.030)
TOTAL PROTEIN, URINE-UPE24: NEGATIVE
URINE GLUCOSE: NEGATIVE
UROBILINOGEN UA: 0.2 (ref 0.0–1.0)
pH: 5.5 (ref 5.0–8.0)

## 2015-11-04 LAB — MAGNESIUM: MAGNESIUM: 2.2 mg/dL (ref 1.5–2.5)

## 2015-11-04 MED ORDER — CLOTRIMAZOLE-BETAMETHASONE 1-0.05 % EX CREA: 1.0000 "application " | TOPICAL_CREAM | Freq: Two times a day (BID) | CUTANEOUS | 0 refills | Status: DC

## 2015-11-04 MED ORDER — CLOTRIMAZOLE-BETAMETHASONE 1-0.05 % EX CREA: 1.0000 "application " | TOPICAL_CREAM | Freq: Two times a day (BID) | CUTANEOUS | 2 refills | Status: DC

## 2015-11-04 MED ORDER — HYDROCODONE-ACETAMINOPHEN 10-325 MG PO TABS: 1.0000 | ORAL_TABLET | Freq: Four times a day (QID) | ORAL | 0 refills | Status: DC | PRN

## 2015-11-04 NOTE — Telephone Encounter (Signed)
Sent in a different size.

## 2015-11-04 NOTE — Assessment & Plan Note (Signed)
She has fallen several times since last visit and has not sought care. She is currently getting services in her home. She may need higher level of care if she cannot be safely cared for at home.

## 2015-11-04 NOTE — Assessment & Plan Note (Signed)
No low sugars since last visit, some in the 300s. Checking CMP for gap.

## 2015-11-04 NOTE — Assessment & Plan Note (Signed)
Needs CBC today to check for elevated WBC and stability of Hg.

## 2015-11-04 NOTE — Assessment & Plan Note (Signed)
Rash on the skin consistent with fungal, clotrimazole cream rx at visit for the skin changes. Warned them that this may burn due to skin breakdown but they should try to use.

## 2015-11-04 NOTE — Progress Notes (Signed)
   Subjective:    Patient ID: Melissa Myers, female    DOB: 10/27/1945, 70 y.o.   MRN: [id number]  HPI The patient is a 70 YO female coming in for lethargy episode reported by team health nurse. She was noted to be sleeping a lot and not very arousable yesterday. Her husband had been giving her pain medicine every 3 hours instead of 6 in an attempt to help her sleep. She is having worsening foot pain for the last week or so. She denies any injury to her feet but she has fallen several times in the last week or so. She took some pain medication this morning and she is not a great historian. Her husband adds some history but he is not the best historian either. She is not having chest pain, SOB, new cough, new stomach pain or diarrhea or constipation. She is having a rash on her foot which is present for months and unchanged. It is sore and the skin is peeling. She denies fevers or chills. Some more urinary incontinence but she is generally incontinent and denies worsening pain.   Review of Systems  Constitutional: Positive for activity change. Negative for appetite change, fatigue, fever and unexpected weight change.  HENT: Negative.   Eyes: Negative.   Respiratory: Negative for cough, chest tightness, shortness of breath and wheezing.   Cardiovascular: Positive for leg swelling. Negative for chest pain and palpitations.       Stable  Gastrointestinal: Negative for abdominal distention, abdominal pain, constipation, diarrhea and nausea.  Genitourinary: Negative for hematuria and urgency.       Incontinence  Musculoskeletal: Positive for arthralgias, gait problem and myalgias.  Skin: Positive for rash.  Neurological: Positive for weakness and numbness. Negative for dizziness and light-headedness.  Psychiatric/Behavioral: Negative.       Objective:   Physical Exam  Constitutional: She is oriented to person, place, and time. She appears well-developed and well-nourished.  Obese  HENT:    Head: Normocephalic and atraumatic.  Eyes: EOM are normal.  Neck: Normal range of motion.  Cardiovascular: Normal rate and regular rhythm.   Pulmonary/Chest: Effort normal and breath sounds normal. No respiratory distress. She has no wheezes. She has no rales.  Abdominal: Soft. Bowel sounds are normal. She exhibits no distension. There is no tenderness. There is no rebound.  Musculoskeletal: She exhibits edema.  1-2+ edema bilateral legs   Neurological: She is alert and oriented to person, place, and time. Coordination abnormal.  Wheelchair bound  Skin: Skin is warm and dry.  Rash on both feet which is consistent with fungal skin infection, some peeling skin, not consistent with cellulitis.   Psychiatric:  Drowsy during the visit, admits to taking pain medication prior to coming to visit.    Vitals:   11/04/15 0914  BP: (!) 88/64  Pulse: 72  Resp: 12  Temp: 98.5 F (36.9 C)  TempSrc: Oral  SpO2: 91%      Assessment & Plan:

## 2015-11-04 NOTE — Assessment & Plan Note (Signed)
She is taking more than she should due to pain. Increase dosage and refill her meds (last filled 09/28/15) and did talk to both her and her husband that she is not allowed to take more medication that prescribed as it is dangerous.

## 2015-11-04 NOTE — Telephone Encounter (Signed)
Melissa Myers called back and wanted to make sure Dr. Sharlet Salina checks her feet for cellulitis. I did let Dr Sharlet Salina know since patient is already here for her appt. Thanks.

## 2015-11-04 NOTE — Patient Instructions (Addendum)
We are checking blood work and the urine today.   We have given you the new hydrocodone which is stronger. Please do not take it more than every 6 hours.   We have also sent in a cream for your feet to use twice a day. It may burn some because of the places where the skin is raw but try to use it.

## 2015-11-04 NOTE — Telephone Encounter (Signed)
I just got the note and patient is scheduled to be here at 9, so I didn't get a chance to call her before the appt.

## 2015-11-04 NOTE — Assessment & Plan Note (Signed)
No clear signs of flare, pro BNP checking today and CMP.

## 2015-11-04 NOTE — Progress Notes (Signed)
Pre visit review using our clinic review tool, if applicable. No additional management support is needed unless otherwise documented below in the visit note. 

## 2015-11-04 NOTE — Telephone Encounter (Signed)
Pharmacy called and the cream prescribed doesn't come in the size ordered. Can they give a different size with a refill? Please advise thanks.

## 2015-11-06 ENCOUNTER — Other Ambulatory Visit: Payer: Self-pay | Admitting: Internal Medicine

## 2015-11-06 ENCOUNTER — Telehealth: Payer: Self-pay

## 2015-11-06 DIAGNOSIS — N179 Acute kidney failure, unspecified: Secondary | ICD-10-CM

## 2015-11-06 MED ORDER — NITROFURANTOIN MONOHYD MACRO 100 MG PO CAPS: 100.0000 mg | ORAL_CAPSULE | Freq: Two times a day (BID) | ORAL | 0 refills | Status: DC

## 2015-11-06 NOTE — Telephone Encounter (Signed)
PA initiated via CoverMyMeds key [id number]

## 2015-11-08 ENCOUNTER — Other Ambulatory Visit: Payer: Self-pay | Admitting: Internal Medicine

## 2015-11-10 ENCOUNTER — Ambulatory Visit: Payer: PPO | Admitting: Endocrinology

## 2015-11-10 ENCOUNTER — Telehealth: Payer: Self-pay | Admitting: Geriatric Medicine

## 2015-11-10 ENCOUNTER — Other Ambulatory Visit: Payer: Self-pay | Admitting: Internal Medicine

## 2015-11-10 ENCOUNTER — Other Ambulatory Visit: Payer: Self-pay

## 2015-11-10 MED ORDER — INSULIN GLARGINE 100 UNIT/ML SOLOSTAR PEN: 50.0000 [IU] | PEN_INJECTOR | Freq: Every day | SUBCUTANEOUS | 0 refills | Status: DC

## 2015-11-10 NOTE — Telephone Encounter (Signed)
Patient says she feels better. She does not want to go out. She does not want to go to the ER. She said she will call us if she gets worse. I informed her that the lantus has been called in and that she needed to be checking her blood sugar 4 times.

## 2015-11-10 NOTE — Telephone Encounter (Signed)
[name] went to see patient. She fell out of a kitchen chair this morning. Patient had been up all night. She has a bruise on her right cheek and her right shoulder hurts when she moves. She is also sore on her head. Blood sugar was 257. Patient went to go to the bathroom and was very groggy. [name] wanted patient to go be seen in the ER. Patient refused. Patient also seemed very slow and confused. This is just an FYI for you. Do you have any suggestions? Patient is asking for a refill of lantus because they do not have another doctor to prescribe it. She has not been checking her blood sugar regularly, maybe every 2 or 3 days.

## 2015-11-10 NOTE — Patient Outreach (Signed)
[facility]) Care Management  11/10/2015  CHENAY GARFIAS [birth year] [id number]   Assessment: 70 year old with multiple comorbidities with recent admission for hypoglycemia. RNCM called for transition of care-no answer-line was busy.  Plan: continue to attempt weekly engagements.  Thea Silversmith, RN, MSN, [place] Coordinator Cell: [phone]

## 2015-11-10 NOTE — Telephone Encounter (Signed)
They need to be checking her sugar 4 times per day with her recent sugars. Have sent in the lantus for her. I would recommend ED evaluation or clinic visit.

## 2015-11-16 ENCOUNTER — Other Ambulatory Visit: Payer: Self-pay | Admitting: Internal Medicine

## 2015-11-16 NOTE — Telephone Encounter (Signed)
Per Pharmacist [place], [place] was APPROVED and medication was dispensed 11/09/2015

## 2015-11-17 ENCOUNTER — Other Ambulatory Visit: Payer: Self-pay

## 2015-11-17 ENCOUNTER — Ambulatory Visit (INDEPENDENT_AMBULATORY_CARE_PROVIDER_SITE_OTHER): Payer: PPO | Admitting: Internal Medicine

## 2015-11-17 ENCOUNTER — Other Ambulatory Visit (INDEPENDENT_AMBULATORY_CARE_PROVIDER_SITE_OTHER): Payer: PPO

## 2015-11-17 ENCOUNTER — Encounter: Payer: Self-pay | Admitting: Internal Medicine

## 2015-11-17 ENCOUNTER — Ambulatory Visit (INDEPENDENT_AMBULATORY_CARE_PROVIDER_SITE_OTHER)
Admission: RE | Admit: 2015-11-17 | Discharge: 2015-11-17 | Disposition: A | Discharge status: Home / Self Care | Payer: PPO | Source: Ambulatory Visit | Attending: Internal Medicine | Admitting: Internal Medicine

## 2015-11-17 ENCOUNTER — Telehealth: Payer: Self-pay

## 2015-11-17 VITALS — BP 128/60 | HR 58 | Temp 98.1°F | Resp 18 | Ht <= 58 in | Wt 248.0 lb

## 2015-11-17 DIAGNOSIS — I10 Essential (primary) hypertension: Secondary | ICD-10-CM | POA: Diagnosis not present

## 2015-11-17 DIAGNOSIS — M545 Low back pain, unspecified: Secondary | ICD-10-CM

## 2015-11-17 DIAGNOSIS — S3992XA Unspecified injury of lower back, initial encounter: Secondary | ICD-10-CM | POA: Diagnosis not present

## 2015-11-17 DIAGNOSIS — N179 Acute kidney failure, unspecified: Secondary | ICD-10-CM

## 2015-11-17 DIAGNOSIS — Z9181 History of falling: Secondary | ICD-10-CM

## 2015-11-17 DIAGNOSIS — G6289 Other specified polyneuropathies: Secondary | ICD-10-CM | POA: Diagnosis not present

## 2015-11-17 DIAGNOSIS — G894 Chronic pain syndrome: Secondary | ICD-10-CM | POA: Diagnosis not present

## 2015-11-17 LAB — BASIC METABOLIC PANEL
BUN: 65 mg/dL — AB (ref 6–23)
CHLORIDE: 74 meq/L — AB (ref 96–112)
CO2: 44 meq/L — AB (ref 19–32)
CREATININE: 1.67 mg/dL — AB (ref 0.40–1.20)
Calcium: 9.3 mg/dL (ref 8.4–10.5)
GFR: 32.2 mL/min — ABNORMAL LOW (ref >60.00)
GLUCOSE: 505 mg/dL — AB (ref 70–99)
Potassium: 2.8 mEq/L — CL (ref 3.5–5.1)
Sodium: 128 mEq/L — ABNORMAL LOW (ref 135–145)

## 2015-11-17 NOTE — Telephone Encounter (Signed)
She needs to take extra 40 meQ dose potassium for the next 3 days (this will be 40 mEq TID) then go back to 40 mEq BID. She should decrease any sugary food or liquids. They should increase the dose of lantus tonight to 75 units (from 50). She also needs to drink more water for the next several days to help reduce her sugars.

## 2015-11-17 NOTE — Patient Outreach (Addendum)
[facility]) Care Management  11/17/2015  Melissa Myers Nov 15, 1945 [id number]  Assessment: RNCM called for transition of care. 70 year old with recent admission due to hypoglycemia.  Member denies any hypoglycemic episodes. Member reports she just came from the doctor's office. Member also acknowledges that she continues to be seen by Nurse from [facility] and Para-Medicine team through the heart failure clinic. [facility] involvement confirmed by [facility] nurse, Melissa Myers in previous conversation.  Fall risk-History of fall-Member reports they discussed her falling x2. Member states she fell and her "tail bone" is hurting. Member states, xray done today-she has know results. Member reports pain bilateral feet, chronic peripheral neuropathy pain. Mrs. Melissa Myers states her pain is about a "6" today. When RNCM last spoke with member her pain was a "10".  Chronic pain-neuropathy of both feet. Member was seen by primary care on regarding pain. Medications changed. Although member continues to report pain, her pain level has decreased from a "10" to a "6".   Since Member has home visits per [facility] and Para-Medicine team, RNCM will not do home visit, but continue to follow telephonically.  Plan: continue weekly transition of care engagements. Melissa [facility] CM Care Plan Problem One   Flowsheet Row Most Recent Value  Care Plan Problem One  knowledge deficit regarding diabetes self care management as evidence by admission for hypoglycemia.   Role Documenting the Problem One  Care Management Melissa Myers for Problem One  Active  THN Long Term Goal (31-90 days)  Member will verbalize at least three components of diabetes management.  THN Long Term Goal Start Date  10/27/15  Interventions for Problem One Long Term Goal  reviewed medications dosage, provided postive feedback regarding following up with provider appointments.  THN CM Short Term Goal  #1 (0-30 days)  member will verbalize follow up with MD appointments within the next 30 days.  THN CM Short Term Goal #1 Start Date  10/27/15  Interventions for Short Term Goal #1  continues to attend follow up appointments.  THN CM Short Term Goal #2 (0-30 days)  member will verbalize signs/symptoms of hypoglycemia and treatment within the next 30 days.  THN CM Short Term Goal #2 Start Date  10/27/15    Melissa Myers CM Care Plan Problem Two   Flowsheet Row Most Recent Value  Care Plan Problem Two  alteration in comfort as evidence by member's and husband's verbalization of pain.   Role Documenting the Problem Two  Care Management Coordinator  Care Plan for Problem Two  Active  THN CM Short Term Goal #1 (0-30 days)  member will verbalize contact/instruction from provider within the next week.  THN CM Short Term Goal #1 Start Date  11/03/15  [facility] CM Short Term Goal #1 Met Date   11/17/15      Melissa Silversmith, RN, MSN, [place] Coordinator Cell: [phone]

## 2015-11-17 NOTE — Telephone Encounter (Signed)
Advised patient of dr crawfords note/instructions---patient repeated back for understanding and also wrote down instructions---routing to dr crawford----do you want to see patient back for repeat labs or follow up office visit?--please advise, I will call patient back

## 2015-11-17 NOTE — Progress Notes (Signed)
Pre visit review using our clinic review tool, if applicable. No additional management support is needed unless otherwise documented below in the visit note. 

## 2015-11-17 NOTE — Telephone Encounter (Signed)
Recd call from [facility]---critical value potassium is 2.8 and critical value glucose is 505---routing to dr crawford, please advise, thanks

## 2015-11-17 NOTE — Patient Instructions (Signed)
You need to get your blood drawn today and get an x-ray of the low back.   We will work on getting the lidoderm patches approved for the low back pain.

## 2015-11-18 MED ORDER — LIDOCAINE 5 % EX PTCH: 1.0000 | MEDICATED_PATCH | CUTANEOUS | 6 refills | Status: DC

## 2015-11-18 NOTE — Telephone Encounter (Addendum)
Advised patient to come back next week if sugars do not respond well to treatment plan this week---I could not find follow up instructions (a time frame) on office visit notes---routing to dr crawford---are you ok with monitoring sugars/med adjustment for 1 week, and return if sugars are not improved---please advise, thanks

## 2015-11-18 NOTE — Progress Notes (Signed)
   Subjective:    Patient ID: Melissa Myers, female    DOB: Aug 13, 1945, 70 y.o.   MRN: [id number]  HPI The patient is a 70 YO female coming in for follow up of her recent severe health problems at home. She is still falling a lot (twice in the last week, once because she fell asleep and then out of her chair, the other due to trying to reach for something while she was leaning and fell). She is using her mobility device at home but is having very limited activity. She is sleeping most of the day. She is no longer having urinary symptoms and is not sure if she is done with the antibiotics yet. No fevers or chills. Denies serious injury of LOC with fall and did not seek medical care. Denies low sugars since last visit. Her husband is still giving her the insulins. They have a visit with endo in several weeks still. Denies weight change or loss or change in swelling in her legs. She is having very bad low back pain since the fall and her husband is worried that she injured something. She denies new numbness or weakness but is still having lots of leg pain and numbness and weakness from previous. No urinary or bowel incontinence.   Review of Systems  Constitutional: Positive for activity change, appetite change and fatigue. Negative for fever and unexpected weight change.  HENT: Negative.   Eyes: Negative.   Respiratory: Negative for cough, chest tightness, shortness of breath and wheezing.   Cardiovascular: Positive for leg swelling. Negative for chest pain and palpitations.       Stable  Gastrointestinal: Negative for abdominal distention, abdominal pain, constipation, diarrhea and nausea.  Genitourinary: Negative for hematuria and urgency.  Musculoskeletal: Positive for arthralgias, back pain, gait problem and myalgias.  Skin: Positive for color change.  Neurological: Positive for weakness and numbness. Negative for dizziness and light-headedness.  Psychiatric/Behavioral: Positive for  decreased concentration, dysphoric mood and sleep disturbance.       Sleeping most of the day      Objective:   Physical Exam  Constitutional: She is oriented to person, place, and time. She appears well-developed and well-nourished.  Obese  HENT:  Head: Normocephalic and atraumatic.  Eyes: EOM are normal.  Neck: Normal range of motion.  Cardiovascular: Normal rate and regular rhythm.   Pulmonary/Chest: Effort normal and breath sounds normal. No respiratory distress. She has no wheezes. She has no rales.  Abdominal: Soft. Bowel sounds are normal. She exhibits no distension. There is no tenderness. There is no rebound.  Musculoskeletal: She exhibits edema and tenderness.  1-2+ edema bilateral legs, stable.   Neurological: She is alert and oriented to person, place, and time. Coordination abnormal.  Wheelchair bound  Skin: Skin is warm and dry.  Rash on both feet which is consistent with fungal skin infection, some peeling skin, not consistent with cellulitis.   Psychiatric:  Drowsy during the visit and falls asleep off and on    Vitals:   11/17/15 1304  BP: 128/60  Pulse: (!) 58  Resp: 18  Temp: 98.1 F (36.7 C)  TempSrc: Oral  SpO2: 90%  Weight: 248 lb (112.5 kg)  Height: 4\' 10"  (1.473 m)      Assessment & Plan:

## 2015-11-18 NOTE — Assessment & Plan Note (Signed)
Referral to palliative care put in place to see if they can help manage her pain better and QOL with more awake time. Also to help clarify goals of care with the family. Without change in her condition her prognosis is overall on the decline.

## 2015-11-18 NOTE — Telephone Encounter (Signed)
Fine but she needs 30 minute visit time

## 2015-11-18 NOTE — Assessment & Plan Note (Signed)
This does limit her walking and QOL quite severely. She is taking gabapentin and it does help some as she ran out a couple months ago and noticed the difference quite a lot.

## 2015-11-18 NOTE — Telephone Encounter (Signed)
She can follow up as recommended. If sugars are not decreasing she can come back sooner.

## 2015-11-18 NOTE — Assessment & Plan Note (Signed)
Checking BMP as last creatinine was much higher than her usual in the setting of UTI.

## 2015-11-18 NOTE — Assessment & Plan Note (Signed)
Checking Dg lumbar for fractures. Rx for lidoderm patches. Cannot rx any more somnolent medications as she already cannot stay awake.

## 2015-11-18 NOTE — Assessment & Plan Note (Signed)
Many falls recently and with some injuries.

## 2015-11-19 ENCOUNTER — Ambulatory Visit (HOSPITAL_COMMUNITY)
Admission: RE | Admit: 2015-11-19 | Discharge: 2015-11-19 | Disposition: A | Discharge status: Home / Self Care | Payer: PPO | Source: Ambulatory Visit | Attending: Cardiology | Admitting: Cardiology

## 2015-11-19 ENCOUNTER — Encounter (HOSPITAL_COMMUNITY): Payer: Self-pay

## 2015-11-19 VITALS — BP 116/64 | HR 75 | Wt 236.5 lb

## 2015-11-19 DIAGNOSIS — R011 Cardiac murmur, unspecified: Secondary | ICD-10-CM | POA: Diagnosis not present

## 2015-11-19 DIAGNOSIS — I5032 Chronic diastolic (congestive) heart failure: Secondary | ICD-10-CM

## 2015-11-19 DIAGNOSIS — E785 Hyperlipidemia, unspecified: Secondary | ICD-10-CM | POA: Diagnosis not present

## 2015-11-19 DIAGNOSIS — I739 Peripheral vascular disease, unspecified: Secondary | ICD-10-CM

## 2015-11-19 LAB — BASIC METABOLIC PANEL
Anion gap: 17 — ABNORMAL HIGH (ref 5–15)
BUN: 64 mg/dL — ABNORMAL HIGH (ref 6–20)
CALCIUM: 9.4 mg/dL (ref 8.9–10.3)
CO2: 34 mmol/L — ABNORMAL HIGH (ref 22–32)
Chloride: 77 mmol/L — ABNORMAL LOW (ref 101–111)
Creatinine, Ser: 2.25 mg/dL — ABNORMAL HIGH (ref 0.44–1.00)
GFR calc Af Amer: 24 mL/min — ABNORMAL LOW (ref >60)
GFR, EST NON AFRICAN AMERICAN: 21 mL/min — AB (ref >60)
GLUCOSE: 458 mg/dL — AB (ref 65–99)
POTASSIUM: 3.2 mmol/L — AB (ref 3.5–5.1)
SODIUM: 128 mmol/L — AB (ref 135–145)

## 2015-11-19 LAB — BRAIN NATRIURETIC PEPTIDE: B NATRIURETIC PEPTIDE 5: 31 pg/mL (ref 0.0–100.0)

## 2015-11-19 MED ORDER — TORSEMIDE 100 MG PO TABS: ORAL_TABLET | ORAL | 6 refills | Status: DC

## 2015-11-19 NOTE — Patient Instructions (Signed)
Routine lab work today. Will notify you of abnormal results, otherwise no news is good news!  HOLD torsemide today and tomorrow. RESTART torsemide Saturday: Take 100mg  (1 tablet) in am and 50 mg (1/2 tablet) in pm.  STOP metolazone.  Return Monday October 9th at 2:00 pm for echo and appointment.  Do the following things EVERYDAY: 1) Weigh yourself in the morning before breakfast. Write it down and keep it in a log. 2) Take your medicines as prescribed 3) Eat low salt foods-Limit salt (sodium) to 2000 mg per day.  4) Stay as active as you can everyday 5) Limit all fluids for the day to less than 2 liters

## 2015-11-19 NOTE — Telephone Encounter (Signed)
I have advised front office by word of mouth, but there is really no way to flag patient's chart to ensure a 30 min visit if patient calls back with uncontrolled glucose

## 2015-11-20 ENCOUNTER — Telehealth (HOSPITAL_COMMUNITY): Payer: Self-pay | Admitting: *Deleted

## 2015-11-20 MED ORDER — TORSEMIDE 100 MG PO TABS: 100.0000 mg | ORAL_TABLET | Freq: Every day | ORAL | 3 refills | Status: DC

## 2015-11-20 NOTE — Telephone Encounter (Signed)
Notes Recorded by Harvie Junior, CMA on 11/20/2015 at 4:50 PM EDT Patient aware. She will hold torsemide sat, sun,mon restart tues at 100mg  daily. No metolazone. Pt will have labs 10/6   ------  Notes Recorded by Harvie Junior, CMA on 11/19/2015 at 4:10 PM EDT Left message for patient to call back. Labs forwarded to PCP. ------  Notes Recorded by Larey Dresser, MD on 11/19/2015 at 1:09 PM EDT Glucose remains very high. Needs to talk with her PCP and schedule appt today. BUN/creatinine is higher. Have her hold the torsemide for 3 days then decrease to 100 mg daily after 3 days. No metolazone. Continue current KCl for now. Needs BMET on Monday.    Ref Range & Units 1d ago 3d ago 2wk ago   Sodium 135 - 145 mmol/L 128   128R   138R    Potassium 3.5 - 5.1 mmol/L 3.2   2.8R   4.2R    Chloride 101 - 111 mmol/L 77   74R   90R     CO2 22 - 32 mmol/L 34   44R   37R     Glucose, Bld 65 - 99 mg/dL 458   505R   213R     BUN 6 - 20 mg/dL 64   65R   56R     Creatinine, Ser 0.44 - 1.00 mg/dL 2.25   1.67R   1.68R     Calcium 8.9 - 10.3 mg/dL 9.4  9.3R  9.6R    GFR calc non Af Amer >60 mL/min 21       GFR calc Af Amer >60 mL/min 24

## 2015-11-21 DIAGNOSIS — Z23 Encounter for immunization: Secondary | ICD-10-CM | POA: Diagnosis not present

## 2015-11-21 NOTE — Progress Notes (Signed)
Patient ID: Melissa Myers, female   DOB: [birth year], 70 y.o.   MRN: [id number]   PCP: Dr. Asa Lente Endocrinologist: Dr Loanne Drilling Cardiology: Dr Aundra Dubin.   70 yo with history of HTN, DM, hyperlipidemia, OHS/OSA, and chronic dyspnea/diastolic CHF.  Patient had an echo in 04/2009 showing moderate LVH and preserved LV systolic function. However, there was a very large LV mid-cavity gradient with valsalva.  Patient developed quite significant exertional dyspnea to the point where she was short of breath walking around her house. She had a pulmonary evaluation with Dr. Elsworth Soho but no primary lung problems were identified. Patient had an ETT-myoview in 05/2009 which was negative for ischemia or infarction. Right heart cath in 05/2009 showed mildly elevated right heart filling pressures but normal PA pressure and normal PCWP. She was started her on a beta blocker (Coreg) to try to lower her LV mid-cavity gradient (that likely occurs with exertion) and to better control BP.  V/Q scan was negative for PE.  PFTs from 06/2011 showed a restrictive defect. Last echo in 11/2013 showed EF 65% with normal RV size and systolic function.      Admitted 12/05/13 with volume overload. Diuresed with IV lasix and transitioned to torsemide 80 mg twice a day + metolazone. Overall she diuresed 21 pounds. Discharge weight was 263 pounds.   She was admitted in 9/16 with Strep agalactiae sepsis likely from cellulitis.    Admitted 12/1 though 01/26/15 with marked volume overload and cellulitis. Diuresed with Lasix drip and placed on antibiotics. Overall diuresed 20 pounds.   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  Most of leg pain is likely due to neuropathy.    She returns for followup.  She has been doing poorly.  Vision is blurry.  Blood glucose has been running very high.  She has fallen twice and feels like her balance is off (walking with walker).  No  lightheadedness or syncope.  Weight is down 18 lbs.  No dyspnea though not very active.  No chest pain.   Labs (10/13): K 4.1, creatinine 0.85 Labs (4/14): K 3.7, creatinine 1.2, BUN 43, BNP 23 Labs (5/14): K 4.1, creatinine 1.0 Labs (7/14): K 4.6 Creatinine 1.0 BNP 39.0  Labs (8/14): K 3.5, creatinine 0.91, BNP 71 Labs (11/14): K 3.7, creatinine 0.9 Labs (12/14): K 4.1, creatinine 0.8, BNP 52 Labs (3/15): K 4, creatinine 0.9 Labs (4/15): K 3.7, creatinine 0.9 Labs (5/15): K 3.8, creatinine 0.9, BNP 29 Labs (6/15): K 3.4, BUN 105, creatinine 0.9 =>1.7 Labs (6/17/5) K 4.2, BUN 41, Creatinine 0.80 Labs (08/19/13) K 3.2, BUN 71, creatinine 1.5 Labs 09/05/13 K 4.1 Creatinine 1.1 Labs 11/07/13 K 3.6 Creatinine 0.91 Labs 12/11/13 K 4.5 Creatine 1.05 Labs (12/15): K 3.8, creatinine 1.03 Labs (03/17/2014): K 4.1 Creatinine 0.99  Labs (4/16): K 3.7, creatinine 1.24 Labs (5/16): LDL 108, TGs 699 Labs (07/24/2014) K 3.5 Creatinine 1.17  Labs (07/31/2014) K 3.3 Creatinine 1.13 Labs (9/16): K 3.5, creatinine 0.93 Labs (01/26/2015) K 3.6 Creatinine 1.36  Labs 04/22/15 K 3.8, Creatinine 1.66 Labs (4/17): K 4.6, creatinine 1.74, HCT 37.9 Labs (9/17): K 2.8, creatinine 1.67, glucose 505, BNP 20, hgb 12.4  Allergies (verified):  1) ! Sulfa  2) Morphine   Past Medical History:  1. Diabetes mellitus, type II  2. Hyperlipidemia: She has been unable to tolerate statins (has been on Vytorin, lovastatin, and Crestor) due to what sounds like rhabdo: developed muscle weakness and  was told she had "muscle damage" with each of these statins. She has been told not to take statins anymore.  Has hypertriglyceridemia.  3. Hypertension 4. Obesity  5. Restless Leg Syndrome  6. OA  7. peripheral neuropathy  8. OHS/OSA: Uses supplemental oxygen, does not tolerate CPAP.   9. Chronic nausea/diarrhea: ? IBS  10. Diastolic CHF: Echo ([id number]) with normal LV size, moderate LVH, EF 65-70%, LV mid-cavity gradient reaching 63  mmHg with valsalva but minimal at rest, grade I diastolic dysfunction, cannot estimate PA systolic pressure (no TR doppler signal), RV normal. RHC (4/11): Mean RA 11, RV 37/13, PA 33/15, mean PCWP 12, CI 3.3. Echo in 11/12 showed mild LVH, EF 65-70%, no LV mid-cavity gradient was mentioned.  Echo in 4/14 showed EF 65-70%, mild LVH, grade I diastolic dysfunction, PA systolic pressure 35 mmHg, mild LV mid-cavity gradient.  Echo (5/15) with EF 60-65%, mild TR, normal RV size and systolic function. Echo (10/15) with EF 65%, moderate LVH, normal RV.  11. ETT-myoview (4/11): 5'30", stopped due to fatigue, normal EF, no evidence for scar or ischemia. 12. V/Q scan 11/12: negative for PE.  Lower extremity venous doppler US (3/14): negative for DVT.  13. PFTs (5/13): FVC 50%, FEV1 62%, ratio 106%, DLCO 69%, TLC 69%.  83. CKD  Family History:  Family History of Alcoholism/Addiction (parent)  Family History Diabetes 1st degree relative (grandparent)  Family History High cholesterol (parent, grandparent)  Family History Hypertension (parent, grandparent)  Family History Lung cancer (grandparent)  Stomach cancer (grandmother)  Celiac Sprue daughter  Mother with MI at 20, father with MI at 50, uncle with MI at 82, brother with stents in his 85s, multiple aunts with MIs and CVAs   Social History:  Never Smoked  no alcohol  married, lives with spouse and her mother  retired Network engineer Alcohol Use - no  Illicit Drug Use - no  Review of Systems  All systems reviewed and negative except as per HPI.   ROS: All systems reviewed and negative except as per HPI.   Current Outpatient Prescriptions  Medication Sig Dispense Refill  . aspirin EC 81 MG tablet Take 1 tablet (81 mg total) by mouth daily.    . B Complex-C (B-COMPLEX WITH VITAMIN C) tablet Take 1 tablet by mouth daily.    . Blood Glucose Calibration (ONETOUCH VERIO) SOLN Use to check sugars up to 4 times per day 1 each 12  . carvedilol (COREG) 3.125  MG tablet Take 1 tablet (3.125 mg total) by mouth 2 (two) times daily with a meal. 60 tablet 0  . Cholecalciferol (VITAMIN D3) 2000 units capsule Take 1 capsule (2,000 Units total) by mouth daily. 90 capsule 3  . citalopram (CELEXA) 20 MG tablet TAKE 1 TABLET (20 MG TOTAL) BY MOUTH DAILY. 30 tablet 2  . clotrimazole (MYCELEX) 10 MG troche Take 1 tablet (10 mg total) by mouth 5 (five) times daily. 140 tablet 2  . clotrimazole-betamethasone (LOTRISONE) cream Apply 1 application topically 2 (two) times daily. 45 g 2  . Coenzyme Q10 200 MG capsule Take 200 mg by mouth daily.    . DULoxetine (CYMBALTA) 60 MG capsule Take 1 capsule (60 mg total) by mouth daily. 90 capsule 1  . ezetimibe (ZETIA) 10 MG tablet TAKE 1 TABLET (10 MG TOTAL) BY MOUTH DAILY. 90 tablet 0  . fenofibrate 160 MG tablet TAKE 1 TABLET (160 MG TOTAL) BY MOUTH DAILY. MUST ESTABLISH WITH NEW PCP FOR ADDITIONAL REFILLS. 90 tablet 3  .  gabapentin (NEURONTIN) 300 MG capsule TAKE TWO CAPSULES BY MOUTH FOUR TIMES A DAY 240 capsule 2  . glucose blood (ONETOUCH VERIO) test strip Use as instructed up to 4 times per day 200 each 12  . hydrALAZINE (APRESOLINE) 25 MG tablet TAKE 1 TABLET (25 MG TOTAL) BY MOUTH 3 (THREE) TIMES DAILY. 90 tablet 2  . HYDROcodone-acetaminophen (NORCO) 10-325 MG tablet Take 1 tablet by mouth every 6 (six) hours as needed. 120 tablet 0  . insulin NPH-regular Human (NOVOLIN 70/30) (70-30) 100 UNIT/ML injection Inject 100 Units into the skin daily with breakfast.    . Magnesium 500 MG TABS Take 500 mg by mouth daily.    . nitrofurantoin, macrocrystal-monohydrate, (MACROBID) 100 MG capsule Take 1 capsule (100 mg total) by mouth 2 (two) times daily. 14 capsule 0  . omega-3 acid ethyl esters (LOVAZA) 1 g capsule Take 2 capsules (2 g total) by mouth 2 (two) times daily. 270 capsule 3  . potassium chloride SA (K-DUR,KLOR-CON) 20 MEQ tablet Take 60 mEq by mouth 2 (two) times daily.    [name] rOPINIRole (REQUIP) 2 MG tablet Take 1  tablet (2 mg total) by mouth 3 (three) times daily as needed. 270 tablet 3  . spironolactone (ALDACTONE) 25 MG tablet TAKE 1 TABLET BY MOUTH DAILY 30 tablet 2  . vitamin B-12 (CYANOCOBALAMIN) 500 MCG tablet Take 500 mcg by mouth daily. Reported on 09/09/2015    . diclofenac sodium (VOLTAREN) 1 % GEL Apply 2 g topically daily as needed (knee pain). (Patient not taking: Reported on 11/19/2015) 10 g 3  . Insulin Glargine (LANTUS) 100 UNIT/ML Solostar Pen Inject 50 Units into the skin daily at 10 pm. 15 mL 0  . torsemide (DEMADEX) 100 MG tablet Take 1 tablet (100 mg total) by mouth daily. 30 tablet 3   No current facility-administered medications for this encounter.    Vitals:   11/19/15 1156  BP: 116/64  Pulse: 75  SpO2: 97%  Weight: 236 lb 8 oz (107.3 kg)   Wt Readings from Last 3 Encounters:  11/19/15 236 lb 8 oz (107.3 kg)  11/17/15 248 lb (112.5 kg)  10/27/15 255 lb (115.7 kg)     General: NAD, obese. Husband present . Arrived in wheelchair.   Neck: Thick, JVP difficult to assess, estimate 7 cm.  No thyromegaly or thyroid nodule.  Lungs: Clear . Chronic 2L oxygen CV: Nondisplaced PMI.  Heart regular S1/S2, no [id number], 3/6 systolic murmur along sternal border.  No carotid bruit.  Abdomen: obese, mildly distended, nontender, no hepatosplenomegaly Neurologic: Alert and oriented x 3.  Psych: Normal affect. Extremities: No clubbing/cyanosis, bilateral severe venous stasis changes. Trace ankle edema.    Assessment/Plan: 1. Chronic diastolic CHF:  NYHA class III symptoms.  She does not appear volume overloaded, weight down 18 lbs and appears more on the dry side.   - Decrease torsemide to 100 qam/50 qpm.  Stop metolazone.  - BMET/BNP today.  - Continue spiro 25 mg daily.  - I will arrange for a repeat echocardiogram. 2. OHS:  Followed by Dr Elsworth Soho, has previously stated no role for CPAP.  - Should use home oxygen continuously  3. Obesity: Weight is coming down.  4. HTN:  Stable. Continue  current regimen.   5. Hyperlipidemia:  High triglycerides.  Currently on Lovaza, Zetia, and fenofibrate.   6. Imbalance: Likely related to diabetic peripheral neuropathy.  Very limiting.  7. CKD: Stage III.  Creatinine has been slowly trending upwards.  BMET today.  8. PAD: Bilateral iliac disease on aortoiliac dopplers in 7/17.  Saw Dr Fletcher Anon, plan for medical management.  She is on ASA and has not been able to tolerate statins. 9. Murmur: Has had vigorous LV contraction with LV mid-cavity gradient in the past (is on Coreg).  Murmur seems louder now, will repeat echo.     Followup in 1 week with PA  Loralie Champagne 11/21/2015

## 2015-11-27 ENCOUNTER — Other Ambulatory Visit: Payer: Self-pay

## 2015-11-27 ENCOUNTER — Telehealth (HOSPITAL_COMMUNITY): Payer: Self-pay | Admitting: *Deleted

## 2015-11-27 ENCOUNTER — Telehealth: Payer: Self-pay | Admitting: Emergency Medicine

## 2015-11-27 ENCOUNTER — Ambulatory Visit: Payer: Self-pay

## 2015-11-27 NOTE — Patient Outreach (Signed)
[facility]) Care Management  11/27/15  Melissa Myers [birth year] [id number]  Successful outreach completed with patient. Patient identification verified. Introduced self and advised RNCM will be following patient.  Patient reports that she is very sleepy, but receptive to call.  Patient reports that her blood sugar "wasn't very good today." She reports that her blood sugar dropped down to 56 today when she checked it between lunch and supper. She stated that drank some soda and was able to get it back up to 160. Patient able to verbalize signs and symptoms of hypoglycemia and reports that once hers is low, she often passes out. She stated that if it is around 56 or lower, she will pass out. Patient does report that she has not checked her blood sugar within the last hour but she does check it often.  RNCM had patient verbalize what to do when her blood sugar is low and patient reported that she drinks some soda and waits for 15 minutes and rechecks her blood sugar. It if is still low, she will drink more soda and recheck.   Patient stated that she "understands everything she is supposed to do, but she just doesn't apply it." RNCM attempted to address barriers to managing her blood sugar at home and patient unable to answer question. Patient was yawning and kept stating that she is feeling tired.   RNCM unable to communicate effectively with patient due to her being so sleepy. Patient agreeable to additional outreach next week. RNCM contact information provided.  [name], RN, BSN, [place] Management Coordinator [phone]

## 2015-11-27 NOTE — Telephone Encounter (Signed)
Melissa Myers from [place] called and asked that you give her a call back. She wants to talk to you about patients blood sugars. Thanks.

## 2015-11-27 NOTE — Telephone Encounter (Signed)
[name] called and stated she saw patient 9/29. She was there when I called to make medication changes due to her creatinine we had her cut back torsemide to 100mg  daily.  [name] asked patient to weight daily which she did not do.  [name] saw patient today and her weight is up 22lbs from 9/29.  [name] drew a bmet and bnp.  Spoke with Dr.McLean who stated patient would need to resume torsemide 100mg  bid.  Tried to contact patient she did not answer so I left instructions with [name].

## 2015-11-30 ENCOUNTER — Encounter (HOSPITAL_COMMUNITY): Payer: Self-pay

## 2015-11-30 ENCOUNTER — Ambulatory Visit (HOSPITAL_BASED_OUTPATIENT_CLINIC_OR_DEPARTMENT_OTHER)
Admission: RE | Admit: 2015-11-30 | Discharge: 2015-11-30 | Disposition: A | Discharge status: Home / Self Care | Payer: PPO | Source: Ambulatory Visit | Attending: Cardiology | Admitting: Cardiology

## 2015-11-30 ENCOUNTER — Ambulatory Visit (HOSPITAL_COMMUNITY)
Admission: RE | Admit: 2015-11-30 | Discharge: 2015-11-30 | Disposition: A | Discharge status: Home / Self Care | Payer: PPO | Source: Ambulatory Visit | Attending: Cardiology | Admitting: Cardiology

## 2015-11-30 ENCOUNTER — Other Ambulatory Visit: Payer: Self-pay | Admitting: Pharmacist

## 2015-11-30 VITALS — BP 110/52 | HR 63 | Ht <= 58 in

## 2015-11-30 DIAGNOSIS — I071 Rheumatic tricuspid insufficiency: Secondary | ICD-10-CM | POA: Insufficient documentation

## 2015-11-30 DIAGNOSIS — R011 Cardiac murmur, unspecified: Secondary | ICD-10-CM | POA: Insufficient documentation

## 2015-11-30 DIAGNOSIS — E669 Obesity, unspecified: Secondary | ICD-10-CM | POA: Insufficient documentation

## 2015-11-30 DIAGNOSIS — E785 Hyperlipidemia, unspecified: Secondary | ICD-10-CM | POA: Insufficient documentation

## 2015-11-30 DIAGNOSIS — E1122 Type 2 diabetes mellitus with diabetic chronic kidney disease: Secondary | ICD-10-CM | POA: Diagnosis not present

## 2015-11-30 DIAGNOSIS — N183 Chronic kidney disease, stage 3 unspecified: Secondary | ICD-10-CM

## 2015-11-30 DIAGNOSIS — I509 Heart failure, unspecified: Secondary | ICD-10-CM | POA: Diagnosis not present

## 2015-11-30 DIAGNOSIS — Z7982 Long term (current) use of aspirin: Secondary | ICD-10-CM | POA: Insufficient documentation

## 2015-11-30 DIAGNOSIS — Z79899 Other long term (current) drug therapy: Secondary | ICD-10-CM | POA: Insufficient documentation

## 2015-11-30 DIAGNOSIS — Z794 Long term (current) use of insulin: Secondary | ICD-10-CM | POA: Diagnosis not present

## 2015-11-30 DIAGNOSIS — E1151 Type 2 diabetes mellitus with diabetic peripheral angiopathy without gangrene: Secondary | ICD-10-CM | POA: Diagnosis not present

## 2015-11-30 DIAGNOSIS — E784 Other hyperlipidemia: Secondary | ICD-10-CM

## 2015-11-30 DIAGNOSIS — G4733 Obstructive sleep apnea (adult) (pediatric): Secondary | ICD-10-CM | POA: Insufficient documentation

## 2015-11-30 DIAGNOSIS — I5032 Chronic diastolic (congestive) heart failure: Secondary | ICD-10-CM | POA: Insufficient documentation

## 2015-11-30 DIAGNOSIS — I130 Hypertensive heart and chronic kidney disease with heart failure and stage 1 through stage 4 chronic kidney disease, or unspecified chronic kidney disease: Secondary | ICD-10-CM | POA: Diagnosis not present

## 2015-11-30 DIAGNOSIS — E7849 Other hyperlipidemia: Secondary | ICD-10-CM

## 2015-11-30 MED ORDER — TORSEMIDE 100 MG PO TABS: 100.0000 mg | ORAL_TABLET | Freq: Two times a day (BID) | ORAL | 3 refills | Status: DC

## 2015-11-30 MED ORDER — METOLAZONE 2.5 MG PO TABS: 2.5000 mg | ORAL_TABLET | ORAL | 3 refills | Status: DC

## 2015-11-30 NOTE — Progress Notes (Signed)
Echocardiogram 2D Echocardiogram has been performed.  Melissa Myers 11/30/2015, 2:38 PM

## 2015-11-30 NOTE — Progress Notes (Signed)
Advanced Heart Failure Medication Review by a Pharmacist  Does the patient  feel that his/her medications are working for him/her?  yes  Has the patient been experiencing any side effects to the medications prescribed?  no  Does the patient measure his/her own blood pressure or blood glucose at home?  yes   Does the patient have any problems obtaining medications due to transportation or finances?   no  Understanding of regimen: good Understanding of indications: good Potential of compliance: good Patient understands to avoid NSAIDs. Patient understands to avoid decongestants.  Issues to address at subsequent visits: None   Pharmacist comments:  Mrs. Boysel is a pleasant 70 yo F presenting with her husband and a medication list. She reports good compliance with her regimen and did not have any specific medication-related questions or concerns for me at this time.   [name]. Velva Harman, PharmD, BCPS, CPP Clinical Pharmacist Pager: [phone] Phone: [phone] 11/30/2015 3:20 PM      Time with patient: 10 minutes Preparation and documentation time: 2 minutes Total time: 12 minutes

## 2015-11-30 NOTE — Progress Notes (Signed)
Patient ID: Melissa Myers, female   DOB: 29-Jul-1945, 70 y.o.   MRN: [id number]   PCP: Dr. Asa Lente Endocrinologist: Dr Loanne Drilling Cardiology: Dr Aundra Dubin.   70 yo with history of HTN, DM, hyperlipidemia, OHS/OSA, and chronic dyspnea/diastolic CHF.  Patient had an echo in 04/2009 showing moderate LVH and preserved LV systolic function. However, there was a very large LV mid-cavity gradient with valsalva.  Patient developed quite significant exertional dyspnea to the point where she was short of breath walking around her house. She had a pulmonary evaluation with Dr. Elsworth Soho but no primary lung problems were identified. Patient had an ETT-myoview in 05/2009 which was negative for ischemia or infarction. Right heart cath in 05/2009 showed mildly elevated right heart filling pressures but normal PA pressure and normal PCWP. She was started her on a beta blocker (Coreg) to try to lower her LV mid-cavity gradient (that likely occurs with exertion) and to better control BP.  V/Q scan was negative for PE.  PFTs from 06/2011 showed a restrictive defect. Last echo in 11/2013 showed EF 65% with normal RV size and systolic function.      Admitted 12/05/13 with volume overload. Diuresed with IV lasix and transitioned to torsemide 80 mg twice a day + metolazone. Overall she diuresed 21 pounds. Discharge weight was 263 pounds.   She was admitted in 9/16 with Strep agalactiae sepsis likely from cellulitis.    Admitted 12/1 though 01/26/15 with marked volume overload and cellulitis. Diuresed with Lasix drip and placed on antibiotics. Overall diuresed 20 pounds.   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  Most of leg pain is likely due to neuropathy.    She returns for followup. Last visit torsemide was cut back and metolazone was stopped.  Refused to weigh today. Says she has been falling a lot because she has difficulty sleeping. Weight at home has gone  up 23 pounds in a week.  Overall feeling ok. Denies SOB/PND/Orthopnea. Does admit to mild dyspnea last night. Appetite ok. Still eating ice but not as much.  Followed by  [facility]. Taking all medications.    Labs (10/13): K 4.1, creatinine 0.85 Labs (4/14): K 3.7, creatinine 1.2, BUN 43, BNP 23 Labs (5/14): K 4.1, creatinine 1.0 Labs (7/14): K 4.6 Creatinine 1.0 BNP 39.0  Labs (8/14): K 3.5, creatinine 0.91, BNP 71 Labs (11/14): K 3.7, creatinine 0.9 Labs (12/14): K 4.1, creatinine 0.8, BNP 52 Labs (3/15): K 4, creatinine 0.9 Labs (4/15): K 3.7, creatinine 0.9 Labs (5/15): K 3.8, creatinine 0.9, BNP 29 Labs (6/15): K 3.4, BUN 105, creatinine 0.9 =>1.7 Labs (6/17/5) K 4.2, BUN 41, Creatinine 0.80 Labs (08/19/13) K 3.2, BUN 71, creatinine 1.5 Labs 09/05/13 K 4.1 Creatinine 1.1 Labs 11/07/13 K 3.6 Creatinine 0.91 Labs 12/11/13 K 4.5 Creatine 1.05 Labs (12/15): K 3.8, creatinine 1.03 Labs (03/17/2014): K 4.1 Creatinine 0.99  Labs (4/16): K 3.7, creatinine 1.24 Labs (5/16): LDL 108, TGs 699 Labs (07/24/2014) K 3.5 Creatinine 1.17  Labs (07/31/2014) K 3.3 Creatinine 1.13 Labs (9/16): K 3.5, creatinine 0.93 Labs (01/26/2015) K 3.6 Creatinine 1.36  Labs 04/22/15 K 3.8, Creatinine 1.66 Labs (4/17): K 4.6, creatinine 1.74, HCT 37.9 Labs (9/17): K 2.8, creatinine 1.67, glucose 505, BNP 20, hgb 12.4 Labs (11/19/2015) : K 3.2 Creatinine 2.25  Labs (11/27/2015): K 4.4 Creatinine 1.26   Allergies (verified):  1) ! Sulfa  2) Morphine   Past Medical History:  1. Diabetes mellitus, type II  2. Hyperlipidemia: She has been unable to tolerate statins (has been on Vytorin, lovastatin, and Crestor) due to what sounds like rhabdo: developed muscle weakness and was told she had "muscle damage" with each of these statins. She has been told not to take statins anymore.  Has hypertriglyceridemia.  3. Hypertension 4. Obesity  5. Restless Leg Syndrome  6. OA  7. peripheral neuropathy  8. OHS/OSA:  Uses supplemental oxygen, does not tolerate CPAP.   9. Chronic nausea/diarrhea: ? IBS  10. Diastolic CHF: Echo ([id number]) with normal LV size, moderate LVH, EF 65-70%, LV mid-cavity gradient reaching 63 mmHg with valsalva but minimal at rest, grade I diastolic dysfunction, cannot estimate PA systolic pressure (no TR doppler signal), RV normal. RHC (4/11): Mean RA 11, RV 37/13, PA 33/15, mean PCWP 12, CI 3.3. Echo in 11/12 showed mild LVH, EF 65-70%, no LV mid-cavity gradient was mentioned.  Echo in 4/14 showed EF 65-70%, mild LVH, grade I diastolic dysfunction, PA systolic pressure 35 mmHg, mild LV mid-cavity gradient.  Echo (5/15) with EF 60-65%, mild TR, normal RV size and systolic function. Echo (10/15) with EF 65%, moderate LVH, normal RV.  11. ETT-myoview (4/11): 5'30", stopped due to fatigue, normal EF, no evidence for scar or ischemia. 12. V/Q scan 11/12: negative for PE.  Lower extremity venous doppler US (3/14): negative for DVT.  13. PFTs (5/13): FVC 50%, FEV1 62%, ratio 106%, DLCO 69%, TLC 69%.  71. CKD  Family History:  Family History of Alcoholism/Addiction (parent)  Family History Diabetes 1st degree relative (grandparent)  Family History High cholesterol (parent, grandparent)  Family History Hypertension (parent, grandparent)  Family History Lung cancer (grandparent)  Stomach cancer (grandmother)  Celiac Sprue daughter  Mother with MI at 29, father with MI at 43, uncle with MI at 85, brother with stents in his 63s, multiple aunts with MIs and CVAs   Social History:  Never Smoked  no alcohol  married, lives with spouse and her mother  retired Network engineer Alcohol Use - no  Illicit Drug Use - no  Review of Systems  All systems reviewed and negative except as per HPI.   ROS: All systems reviewed and negative except as per HPI.   Current Outpatient Prescriptions  Medication Sig Dispense Refill  . aspirin EC 81 MG tablet Take 1 tablet (81 mg total) by mouth daily.    . B Complex-C  (B-COMPLEX WITH VITAMIN C) tablet Take 1 tablet by mouth daily.    . Blood Glucose Calibration (ONETOUCH VERIO) SOLN Use to check sugars up to 4 times per day 1 each 12  . carvedilol (COREG) 3.125 MG tablet Take 1 tablet (3.125 mg total) by mouth 2 (two) times daily with a meal. 60 tablet 0  . Cholecalciferol (VITAMIN D3) 2000 units capsule Take 1 capsule (2,000 Units total) by mouth daily. 90 capsule 3  . citalopram (CELEXA) 20 MG tablet TAKE 1 TABLET (20 MG TOTAL) BY MOUTH DAILY. 30 tablet 2  . clotrimazole (MYCELEX) 10 MG troche Take 1 tablet (10 mg total) by mouth 5 (five) times daily. 140 tablet 2  . clotrimazole-betamethasone (LOTRISONE) cream Apply 1 application topically 2 (two) times daily. 45 g 2  . Coenzyme Q10 200 MG capsule Take 200 mg by mouth daily.    . diclofenac sodium (VOLTAREN) 1 % GEL Apply 2 g topically daily as needed (knee pain). (Patient not taking: Reported on 11/19/2015) 10 g 3  . DULoxetine (CYMBALTA) 60 MG  capsule Take 1 capsule (60 mg total) by mouth daily. 90 capsule 1  . ezetimibe (ZETIA) 10 MG tablet TAKE 1 TABLET (10 MG TOTAL) BY MOUTH DAILY. 90 tablet 0  . fenofibrate 160 MG tablet TAKE 1 TABLET (160 MG TOTAL) BY MOUTH DAILY. MUST ESTABLISH WITH NEW PCP FOR ADDITIONAL REFILLS. 90 tablet 3  . gabapentin (NEURONTIN) 300 MG capsule TAKE TWO CAPSULES BY MOUTH FOUR TIMES A DAY 240 capsule 2  . glucose blood (ONETOUCH VERIO) test strip Use as instructed up to 4 times per day 200 each 12  . hydrALAZINE (APRESOLINE) 25 MG tablet TAKE 1 TABLET (25 MG TOTAL) BY MOUTH 3 (THREE) TIMES DAILY. 90 tablet 2  . HYDROcodone-acetaminophen (NORCO) 10-325 MG tablet Take 1 tablet by mouth every 6 (six) hours as needed. 120 tablet 0  . Insulin Glargine (LANTUS) 100 UNIT/ML Solostar Pen Inject 50 Units into the skin daily at 10 pm. 15 mL 0  . insulin NPH-regular Human (NOVOLIN 70/30) (70-30) 100 UNIT/ML injection Inject 100 Units into the skin daily with breakfast.    . Magnesium 500 MG  TABS Take 500 mg by mouth daily.    . nitrofurantoin, macrocrystal-monohydrate, (MACROBID) 100 MG capsule Take 1 capsule (100 mg total) by mouth 2 (two) times daily. 14 capsule 0  . omega-3 acid ethyl esters (LOVAZA) 1 g capsule Take 2 capsules (2 g total) by mouth 2 (two) times daily. 270 capsule 3  . potassium chloride SA (K-DUR,KLOR-CON) 20 MEQ tablet Take 60 mEq by mouth 2 (two) times daily.    [name] rOPINIRole (REQUIP) 2 MG tablet Take 1 tablet (2 mg total) by mouth 3 (three) times daily as needed. 270 tablet 3  . spironolactone (ALDACTONE) 25 MG tablet TAKE 1 TABLET BY MOUTH DAILY 30 tablet 2  . torsemide (DEMADEX) 100 MG tablet Take 1 tablet (100 mg total) by mouth daily. 30 tablet 3  . vitamin B-12 (CYANOCOBALAMIN) 500 MCG tablet Take 500 mcg by mouth daily. Reported on 09/09/2015     No current facility-administered medications for this encounter.    There were no vitals filed for this visit. Wt Readings from Last 3 Encounters:  11/19/15 236 lb 8 oz (107.3 kg)  11/17/15 248 lb (112.5 kg)  10/27/15 255 lb (115.7 kg)     General: NAD, obese. Husband present . Arrived in wheelchair.   Neck: Thick, JVP difficult to assess, but appears elevated.  No thyromegaly or thyroid nodule.  Lungs: Clear .  CV: Nondisplaced PMI.  Heart regular S1/S2, no [id number], 3/6 systolic murmur along sternal border.  No carotid bruit.  Abdomen: obese, mildly distended, nontender, no hepatosplenomegaly Neurologic: Alert and oriented x 3.  Psych: Normal affect. Extremities: No clubbing/cyanosis, bilateral severe venous stasis changes. R and LLE 3+ edema.    Assessment/Plan: 1. Acute/ Chronic diastolic CHF:  NYHA class III symptoms.  Refused to weigh today On exam she is volume overloaded. Will need to increase torsemide again to 100 mg twice a day. She will take metolazone for the next 2 days then stop.  - Continue spiro 25 mg daily.  -ECHO today. Results pending.  2. OHS:  Followed by Dr Elsworth Soho, has previously  stated no role for CPAP.  - Should use home oxygen continuously   3. Obesity: Needs to cut back portions. Weight back up. 4. HTN:  Stable. Continue current regimen.   5. Hyperlipidemia:  High triglycerides.  Currently on Lovaza, Zetia, and fenofibrate.   6. Imbalance: Likely related to diabetic  peripheral neuropathy.  Very limiting.  7. CKD: Stage III.  Creatinine trending down from 10/6.   8. PAD: Bilateral iliac disease on aortoiliac dopplers in 7/17.  Saw Dr Fletcher Anon, plan for medical management.  She is on ASA and has not been able to tolerate statins. 9. Murmur: Has had vigorous LV contraction with LV mid-cavity gradient in the past (is on Coreg).  ECHO pending.      Follow up in 1 week to reassess volume status.   Check BMET at that time.   Amy Clegg NP-C  11/30/2015

## 2015-11-30 NOTE — Telephone Encounter (Signed)
Per Jonelle Sidle, she talked to [place]. Patient is non compliant with checking her blood sugars. She keeps forgetting. Home health nurse came up with a strategy to help patient remember. She was going to place reminders in different places in the home.

## 2015-11-30 NOTE — Patient Instructions (Signed)
START Metolazone one tab twice a week on Tuesdays and Wednesdays INCREASE Torsemide 100 mg twice a day   Your physician recommends that you schedule a follow-up appointment in: 1 week with Dr Aundra Dubin or Rebecca Eaton   Do the following things EVERYDAY: 1) Weigh yourself in the morning before breakfast. Write it down and keep it in a log. 2) Take your medicines as prescribed 3) Eat low salt foods-Limit salt (sodium) to 2000 mg per day.  4) Stay as active as you can everyday 5) Limit all fluids for the day to less than 2 liters

## 2015-11-30 NOTE — Patient Outreach (Signed)
[facility]) Care Management  11/30/2015  AREION PROTZMAN December 13, 1945 [id number]  Patient was referred to [facility] by [place] for medication review due to being on greater than 15 medications.   Patient is currently receiving services from [place], [place] as well.   Successful phone outreach to patient, she verified HIPAA details and purpose of call was explained to patient.   Patients states she is interested in reviewing medications over the phone with [facility] Pharmacist and a time was scheduled with patient for tomorrow afternoon.   Plan:  Will make outreach to patient as scheduled.   Karrie Meres, PharmD, [place] [phone]

## 2015-12-01 ENCOUNTER — Other Ambulatory Visit: Payer: Self-pay | Admitting: Internal Medicine

## 2015-12-01 ENCOUNTER — Other Ambulatory Visit: Payer: Self-pay | Admitting: Pharmacist

## 2015-12-01 NOTE — Patient Outreach (Signed)
[facility]) Care Management  12/01/2015  Melissa Myers Apr 22, 1945 [id number]  Called patient as agreed today to review her medications with her over the phone.  Patient answered call and verified HIPAA details.  She states that she wasn't feeling well and was interested in rescheduling.    Plan:  Rescheduled medication review phone call with patient per patient request.   Karrie Meres, PharmD, [place] [phone]

## 2015-12-03 ENCOUNTER — Other Ambulatory Visit: Payer: Self-pay | Admitting: Pharmacist

## 2015-12-03 ENCOUNTER — Ambulatory Visit: Payer: Self-pay

## 2015-12-03 NOTE — Patient Outreach (Signed)
[facility]) Care Management  12/03/2015  Melissa Myers [birth year] [id number]  [facility] Pharmacist made successful phone outreach to patient for medication review phone call with patient.  Patient verified her HIPAA details.  Patient reports she is still interested in reviewing her medications with [facility] Pharmacist, but she reports she still isn't feeling well.    Plan:  Will make an outreach call to patient next week.   Karrie Meres, PharmD, [place] [phone]

## 2015-12-04 ENCOUNTER — Other Ambulatory Visit (HOSPITAL_COMMUNITY): Payer: Self-pay | Admitting: Cardiology

## 2015-12-04 ENCOUNTER — Telehealth (HOSPITAL_COMMUNITY): Payer: Self-pay | Admitting: *Deleted

## 2015-12-04 ENCOUNTER — Other Ambulatory Visit: Payer: Self-pay

## 2015-12-04 ENCOUNTER — Other Ambulatory Visit: Payer: Self-pay | Admitting: Internal Medicine

## 2015-12-04 NOTE — Telephone Encounter (Signed)
[name], RN called concerned about pt's LE edema, she states pt does not weigh herself daily on a regular basis.  Pt did wt today and wt was 260.8, last Fri she was 263 and the week before she was at 240.8.  She states pt seems the same as she did last Fri, no SOB, lungs CTA, LE very swollen.  She states pt has been compliant with meds including Torsemide 100 mg BID since last Fri and did take Metolazone 2.5 mg Tue and Wed this week as ordered by [place].  Discussed all above w/Andy Chalmers Cater, PA he advised pt limit salt and fluids, keep legs elevated as much as possible, take 1 dose of Metolazone 2.5 mg tomorrow AM and keep appt as sch Tue 10/17.  [name] is aware and will advise pt.

## 2015-12-04 NOTE — Patient Outreach (Signed)
[facility]) Care Management  12/04/15  Melissa Myers 1946/02/02 [id number]   Successful outreach completed with patient. Patient identification verified.  Patient reports that her blood sugars have been going up and down. She denies any blood sugars down in the 50's. Patient unable to provide readings because she is laying down with her legs elevated, but reports that they have been better since last call. RNCM encouraged to write down blood sugars and assessed patient's knowledge about what to do when her sugars are low. Patient able to verbalize plan to consume 15 grams of carbohydrates and recheck sugar in 15 minutes.   She stated that she has been having some trouble with her legs weeping with her swelling. Stated that her home health nurse came out yesterday to assess her legs. She reports that she was told to pat them dry and to keep them elevated as much as possible to assist with the weeping. She reported that they are currently elevated. Patient denies any shortness of breath, but does have swelling in her legs, feet and hands. She stated that she has an appointment next week to see her doctor on Tuesday. Patient stated that she has not been weighing herself consistently, but did weigh today and reports it was 260.8. RNCM educated patient regarding signs and symptoms that would warrant call to 911 and when to call doctor. Patient verbalized understanding.  Patient continues to complain of pain in bilateral knees. Currently rates 4/10. She stated that when she is not laying down with her legs elevated, her pain is 8/10. Patient stated that she does have hydrocodone for pain and takes 1 tablet every 6 hours as needed. She reports that it helps a little and usually cuts her pain in half.   Patient has no other concerns or questions at present.    Plan: Will follow up with patient in next couple of weeks. RNCM will continue to monitor diabetes and heart failure and  provide education for self-management.   [name], RN, BSN, [place] Management Coordinator [phone]

## 2015-12-07 ENCOUNTER — Ambulatory Visit: Payer: Self-pay | Admitting: Pharmacist

## 2015-12-08 ENCOUNTER — Ambulatory Visit: Payer: Self-pay | Admitting: Pharmacist

## 2015-12-08 ENCOUNTER — Ambulatory Visit (HOSPITAL_COMMUNITY)
Admission: RE | Admit: 2015-12-08 | Discharge: 2015-12-08 | Disposition: A | Discharge status: Home / Self Care | Payer: PPO | Source: Ambulatory Visit | Attending: Cardiology | Admitting: Cardiology

## 2015-12-08 VITALS — BP 120/56 | HR 67 | Wt 255.2 lb

## 2015-12-08 DIAGNOSIS — N183 Chronic kidney disease, stage 3 unspecified: Secondary | ICD-10-CM

## 2015-12-08 DIAGNOSIS — Z794 Long term (current) use of insulin: Secondary | ICD-10-CM | POA: Diagnosis not present

## 2015-12-08 DIAGNOSIS — Z79899 Other long term (current) drug therapy: Secondary | ICD-10-CM | POA: Insufficient documentation

## 2015-12-08 DIAGNOSIS — R2689 Other abnormalities of gait and mobility: Secondary | ICD-10-CM | POA: Diagnosis not present

## 2015-12-08 DIAGNOSIS — R011 Cardiac murmur, unspecified: Secondary | ICD-10-CM | POA: Diagnosis not present

## 2015-12-08 DIAGNOSIS — I83029 Varicose veins of left lower extremity with ulcer of unspecified site: Secondary | ICD-10-CM

## 2015-12-08 DIAGNOSIS — I10 Essential (primary) hypertension: Secondary | ICD-10-CM | POA: Diagnosis not present

## 2015-12-08 DIAGNOSIS — E1151 Type 2 diabetes mellitus with diabetic peripheral angiopathy without gangrene: Secondary | ICD-10-CM | POA: Diagnosis not present

## 2015-12-08 DIAGNOSIS — I5033 Acute on chronic diastolic (congestive) heart failure: Secondary | ICD-10-CM | POA: Diagnosis not present

## 2015-12-08 DIAGNOSIS — G4733 Obstructive sleep apnea (adult) (pediatric): Secondary | ICD-10-CM | POA: Insufficient documentation

## 2015-12-08 DIAGNOSIS — E669 Obesity, unspecified: Secondary | ICD-10-CM | POA: Insufficient documentation

## 2015-12-08 DIAGNOSIS — L03119 Cellulitis of unspecified part of limb: Secondary | ICD-10-CM | POA: Diagnosis not present

## 2015-12-08 DIAGNOSIS — I130 Hypertensive heart and chronic kidney disease with heart failure and stage 1 through stage 4 chronic kidney disease, or unspecified chronic kidney disease: Secondary | ICD-10-CM | POA: Insufficient documentation

## 2015-12-08 DIAGNOSIS — I83019 Varicose veins of right lower extremity with ulcer of unspecified site: Secondary | ICD-10-CM

## 2015-12-08 DIAGNOSIS — L97929 Non-pressure chronic ulcer of unspecified part of left lower leg with unspecified severity: Secondary | ICD-10-CM

## 2015-12-08 DIAGNOSIS — Z7982 Long term (current) use of aspirin: Secondary | ICD-10-CM | POA: Insufficient documentation

## 2015-12-08 DIAGNOSIS — Z125 Encounter for screening for malignant neoplasm of prostate: Secondary | ICD-10-CM | POA: Diagnosis not present

## 2015-12-08 DIAGNOSIS — E1122 Type 2 diabetes mellitus with diabetic chronic kidney disease: Secondary | ICD-10-CM | POA: Diagnosis not present

## 2015-12-08 DIAGNOSIS — I5032 Chronic diastolic (congestive) heart failure: Secondary | ICD-10-CM | POA: Diagnosis not present

## 2015-12-08 DIAGNOSIS — E785 Hyperlipidemia, unspecified: Secondary | ICD-10-CM | POA: Diagnosis not present

## 2015-12-08 DIAGNOSIS — I872 Venous insufficiency (chronic) (peripheral): Secondary | ICD-10-CM | POA: Insufficient documentation

## 2015-12-08 DIAGNOSIS — E784 Other hyperlipidemia: Secondary | ICD-10-CM | POA: Diagnosis not present

## 2015-12-08 DIAGNOSIS — L97919 Non-pressure chronic ulcer of unspecified part of right lower leg with unspecified severity: Secondary | ICD-10-CM

## 2015-12-08 LAB — BASIC METABOLIC PANEL
ANION GAP: 13 (ref 5–15)
BUN: 34 mg/dL — AB (ref 6–20)
CHLORIDE: 88 mmol/L — AB (ref 101–111)
CO2: 32 mmol/L (ref 22–32)
Calcium: 8.9 mg/dL (ref 8.9–10.3)
Creatinine, Ser: 1.56 mg/dL — ABNORMAL HIGH (ref 0.44–1.00)
GFR calc Af Amer: 38 mL/min — ABNORMAL LOW (ref >60)
GFR, EST NON AFRICAN AMERICAN: 33 mL/min — AB (ref >60)
Glucose, Bld: 461 mg/dL — ABNORMAL HIGH (ref 65–99)
POTASSIUM: 4.3 mmol/L (ref 3.5–5.1)
SODIUM: 133 mmol/L — AB (ref 135–145)

## 2015-12-08 LAB — CBC
HCT: 33.6 % — ABNORMAL LOW (ref 36.0–46.0)
HEMOGLOBIN: 11 g/dL — AB (ref 12.0–15.0)
MCH: 26.8 pg (ref 26.0–34.0)
MCHC: 32.7 g/dL (ref 30.0–36.0)
MCV: 82 fL (ref 78.0–100.0)
Platelets: 387 10*3/uL (ref 150–400)
RBC: 4.1 MIL/uL (ref 3.87–5.11)
RDW: 15 % (ref 11.5–15.5)
WBC: 8.7 10*3/uL (ref 4.0–10.5)

## 2015-12-08 LAB — BRAIN NATRIURETIC PEPTIDE: B NATRIURETIC PEPTIDE 5: 46.1 pg/mL (ref 0.0–100.0)

## 2015-12-08 MED ORDER — DOXYCYCLINE HYCLATE 100 MG PO TABS: 100.0000 mg | ORAL_TABLET | Freq: Two times a day (BID) | ORAL | 0 refills | Status: DC

## 2015-12-08 MED ORDER — METOLAZONE 2.5 MG PO TABS: ORAL_TABLET | ORAL | 6 refills | Status: DC

## 2015-12-08 NOTE — Patient Instructions (Signed)
Routine lab work today. Will notify you of abnormal results, otherwise no news is good news!  INCREASE Metolazone to 2.5 mg tablet once on Monday, Wednesday, and Saturday mornings.  Take Doxycycline 100 mg tablet twice daily for 10 days (until bottle empty).  Will refer you to the [facility]. Address: [address] [place], [place] [address] Hours:  8:30AM-5PM Phone: [phone]  Follow up 1 week with Amy Clegg NP-C.  Do the following things EVERYDAY: 1) Weigh yourself in the morning before breakfast. Write it down and keep it in a log. 2) Take your medicines as prescribed 3) Eat low salt foods-Limit salt (sodium) to 2000 mg per day.  4) Stay as active as you can everyday 5) Limit all fluids for the day to less than 2 liters

## 2015-12-08 NOTE — Progress Notes (Signed)
Patient ID: Melissa Myers, female   DOB: [birth year], 70 y.o.   MRN: [id number]   PCP: Dr. Sharlet Salina Endocrinologist: Dr Loanne Drilling Cardiology: Dr Aundra Dubin.   70 yo with history of HTN, DM, hyperlipidemia, OHS/OSA, and chronic dyspnea/diastolic CHF.  Patient had an echo in 04/2009 showing moderate LVH and preserved LV systolic function. However, there was a very large LV mid-cavity gradient with valsalva.  Patient developed quite significant exertional dyspnea to the point where she was short of breath walking around her house. She had a pulmonary evaluation with Dr. Elsworth Soho but no primary lung problems were identified. Patient had an ETT-myoview in 05/2009 which was negative for ischemia or infarction. Right heart cath in 05/2009 showed mildly elevated right heart filling pressures but normal PA pressure and normal PCWP. She was started her on a beta blocker (Coreg) to try to lower her LV mid-cavity gradient (that likely occurs with exertion) and to better control BP.  V/Q scan was negative for PE.  PFTs from 06/2011 showed a restrictive defect. Last echo in 11/2013 showed EF 65% with normal RV size and systolic function.      Admitted 12/05/13 with volume overload. Diuresed with IV lasix and transitioned to torsemide 80 mg twice a day + metolazone. Overall she diuresed 21 pounds. Discharge weight was 263 pounds.   She was admitted in 9/16 with Strep agalactiae sepsis likely from cellulitis.    Admitted 12/1 though 01/26/15 with marked volume overload and cellulitis. Diuresed with Lasix drip and placed on antibiotics. Overall diuresed 20 pounds.   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  Most of leg pain is likely due to neuropathy.    She returns for today for follow up.  Last visit torsemide increased with weight gain.  Also took two days of metolazone.  Weight at home up 3 lbs.  States her weight did not go down. No further falls.  No  SOB.  Mostly limited from pain in legs. Has glass of ice with her in clinic that she brought from home. Eats ice throughout the day. Denies Orthopnea/PND. Followed by  [facility]. Taking all medication as directed as directed. She had fried fish for dinner last night from [place].   Labs (10/13): K 4.1, creatinine 0.85 Labs (4/14): K 3.7, creatinine 1.2, BUN 43, BNP 23 Labs (5/14): K 4.1, creatinine 1.0 Labs (7/14): K 4.6 Creatinine 1.0 BNP 39.0  Labs (8/14): K 3.5, creatinine 0.91, BNP 71 Labs (11/14): K 3.7, creatinine 0.9 Labs (12/14): K 4.1, creatinine 0.8, BNP 52 Labs (3/15): K 4, creatinine 0.9 Labs (4/15): K 3.7, creatinine 0.9 Labs (5/15): K 3.8, creatinine 0.9, BNP 29 Labs (6/15): K 3.4, BUN 105, creatinine 0.9 =>1.7 Labs (6/17/5) K 4.2, BUN 41, Creatinine 0.80 Labs (08/19/13) K 3.2, BUN 71, creatinine 1.5 Labs 09/05/13 K 4.1 Creatinine 1.1 Labs 11/07/13 K 3.6 Creatinine 0.91 Labs 12/11/13 K 4.5 Creatine 1.05 Labs (12/15): K 3.8, creatinine 1.03 Labs (03/17/2014): K 4.1 Creatinine 0.99  Labs (4/16): K 3.7, creatinine 1.24 Labs (5/16): LDL 108, TGs 699 Labs (07/24/2014) K 3.5 Creatinine 1.17  Labs (07/31/2014) K 3.3 Creatinine 1.13 Labs (9/16): K 3.5, creatinine 0.93 Labs (01/26/2015) K 3.6 Creatinine 1.36  Labs 04/22/15 K 3.8, Creatinine 1.66 Labs (4/17): K 4.6, creatinine 1.74, HCT 37.9 Labs (9/17): K 2.8, creatinine 1.67, glucose 505, BNP 20, hgb 12.4 Labs (11/19/2015) : K 3.2 Creatinine 2.25  Labs (11/27/2015): K 4.4  Creatinine 1.26   Allergies (verified):  1) ! Sulfa  2) Morphine   Past Medical History:  1. Diabetes mellitus, type II  2. Hyperlipidemia: She has been unable to tolerate statins (has been on Vytorin, lovastatin, and Crestor) due to what sounds like rhabdo: developed muscle weakness and was told she had "muscle damage" with each of these statins. She has been told not to take statins anymore.  Has hypertriglyceridemia.  3. Hypertension 4. Obesity    5. Restless Leg Syndrome  6. OA  7. peripheral neuropathy  8. OHS/OSA: Uses supplemental oxygen, does not tolerate CPAP.   9. Chronic nausea/diarrhea: ? IBS  10. Diastolic CHF: Echo ([id number]) with normal LV size, moderate LVH, EF 65-70%, LV mid-cavity gradient reaching 63 mmHg with valsalva but minimal at rest, grade I diastolic dysfunction, cannot estimate PA systolic pressure (no TR doppler signal), RV normal. RHC (4/11): Mean RA 11, RV 37/13, PA 33/15, mean PCWP 12, CI 3.3. Echo in 11/12 showed mild LVH, EF 65-70%, no LV mid-cavity gradient was mentioned.  Echo in 4/14 showed EF 65-70%, mild LVH, grade I diastolic dysfunction, PA systolic pressure 35 mmHg, mild LV mid-cavity gradient.  Echo (5/15) with EF 60-65%, mild TR, normal RV size and systolic function. Echo (10/15) with EF 65%, moderate LVH, normal RV.  11. ETT-myoview (4/11): 5'30", stopped due to fatigue, normal EF, no evidence for scar or ischemia. 12. V/Q scan 11/12: negative for PE.  Lower extremity venous doppler US (3/14): negative for DVT.  13. PFTs (5/13): FVC 50%, FEV1 62%, ratio 106%, DLCO 69%, TLC 69%.  69. CKD  Family History:  Family History of Alcoholism/Addiction (parent)  Family History Diabetes 1st degree relative (grandparent)  Family History High cholesterol (parent, grandparent)  Family History Hypertension (parent, grandparent)  Family History Lung cancer (grandparent)  Stomach cancer (grandmother)  Celiac Sprue daughter  Mother with MI at 6, father with MI at 3, uncle with MI at 37, brother with stents in his 66s, multiple aunts with MIs and CVAs   Social History:  Never Smoked  no alcohol  married, lives with spouse and her mother  retired Network engineer Alcohol Use - no  Illicit Drug Use - no  Review of Systems  All systems reviewed and negative except as per HPI.   ROS: All systems reviewed and negative except as per HPI.   Current Outpatient Prescriptions  Medication Sig Dispense Refill  . aspirin  EC 81 MG tablet Take 1 tablet (81 mg total) by mouth daily.    . B Complex-C (B-COMPLEX WITH VITAMIN C) tablet Take 1 tablet by mouth daily.    . Blood Glucose Calibration (ONETOUCH VERIO) SOLN Use to check sugars up to 4 times per day 1 each 12  . carvedilol (COREG) 3.125 MG tablet Take 1 tablet (3.125 mg total) by mouth 2 (two) times daily with a meal. 60 tablet 0  . Cholecalciferol (VITAMIN D3) 2000 units capsule Take 1 capsule (2,000 Units total) by mouth daily. 90 capsule 3  . citalopram (CELEXA) 20 MG tablet TAKE 1 TABLET (20 MG TOTAL) BY MOUTH DAILY. 30 tablet 2  . clotrimazole (MYCELEX) 10 MG troche Take 1 tablet (10 mg total) by mouth 5 (five) times daily. 140 tablet 2  . clotrimazole-betamethasone (LOTRISONE) cream Apply 1 application topically 2 (two) times daily. 45 g 2  . Coenzyme Q10 200 MG capsule Take 200 mg by mouth daily.    . diclofenac sodium (VOLTAREN) 1 % GEL Apply 2 g topically  daily as needed (knee pain). 10 g 3  . DULoxetine (CYMBALTA) 60 MG capsule Take 1 capsule (60 mg total) by mouth daily. 90 capsule 1  . ezetimibe (ZETIA) 10 MG tablet TAKE 1 TABLET (10 MG TOTAL) BY MOUTH DAILY. 90 tablet 0  . fenofibrate 160 MG tablet TAKE 1 TABLET (160 MG TOTAL) BY MOUTH DAILY. MUST ESTABLISH WITH NEW PCP FOR ADDITIONAL REFILLS. 90 tablet 3  . gabapentin (NEURONTIN) 300 MG capsule TAKE TWO CAPSULES BY MOUTH FOUR TIMES A DAY 240 capsule 2  . glucose blood (ONETOUCH VERIO) test strip Use as instructed up to 4 times per day 200 each 12  . hydrALAZINE (APRESOLINE) 25 MG tablet TAKE 1 TABLET (25 MG TOTAL) BY MOUTH 3 (THREE) TIMES DAILY. 90 tablet 2  . HYDROcodone-acetaminophen (NORCO) 10-325 MG tablet Take 1 tablet by mouth every 6 (six) hours as needed. 120 tablet 0  . Insulin Glargine (LANTUS SOLOSTAR) 100 UNIT/ML Solostar Pen Inject 75 Units into the skin daily at 10 pm.    . insulin NPH-regular Human (NOVOLIN 70/30) (70-30) 100 UNIT/ML injection Inject 100 Units into the skin daily  with breakfast.    . LANTUS SOLOSTAR 100 UNIT/ML Solostar Pen INECT 50 UNITS UNDER THE SKIN DAILY AT 10 PM 15 pen 0  . Magnesium 500 MG TABS Take 500 mg by mouth daily.    . metolazone (ZAROXOLYN) 2.5 MG tablet Take 1 tablet (2.5 mg total) by mouth 2 (two) times a week. Tuesday and Wednesday 10 tablet 3  . omega-3 acid ethyl esters (LOVAZA) 1 g capsule Take 2 capsules (2 g total) by mouth 2 (two) times daily. 270 capsule 3  . potassium chloride SA (K-DUR,KLOR-CON) 20 MEQ tablet Take 60 mEq by mouth 2 (two) times daily.    [name] rOPINIRole (REQUIP XL) 4 MG 24 hr tablet TAKE 1 TABLET (4 MG TOTAL) BY MOUTH AT BEDTIME. 30 tablet 1  . rOPINIRole (REQUIP) 2 MG tablet Take 1 tablet (2 mg total) by mouth 3 (three) times daily as needed. 270 tablet 3  . spironolactone (ALDACTONE) 25 MG tablet TAKE 1 TABLET BY MOUTH DAILY 30 tablet 2  . vitamin B-12 (CYANOCOBALAMIN) 500 MCG tablet Take 500 mcg by mouth daily. Reported on 09/09/2015    . torsemide (DEMADEX) 100 MG tablet Take 1 tablet (100 mg total) by mouth 2 (two) times daily. 60 tablet 3   No current facility-administered medications for this encounter.    Vitals:   12/08/15 1519  BP: (!) 120/56  BP Location: Left Arm  Patient Position: Sitting  Cuff Size: Large  Pulse: 67  SpO2: 98%  Weight: 255 lb 3.2 oz (115.8 kg)   Wt Readings from Last 3 Encounters:  12/08/15 255 lb 3.2 oz (115.8 kg)  11/19/15 236 lb 8 oz (107.3 kg)  11/17/15 248 lb (112.5 kg)     General: NAD, obese. Husband present.  In wheelchair.    Neck: Thick, JVP difficult to assess but still appears elevated. No thyromegaly or thyroid nodule.  Lungs: CTAB, normal effort.  CV: Nondisplaced PMI.  Heart regular S1/S2, no [id number], 3/6 systolic murmur along sternal border.  No carotid bruit.  Abdomen: obese, soft, NT, ND, no HSM. No bruits or masses. +BS  Neurologic: Alert and oriented x 3.  Psych: Normal affect. Extremities: No clubbing/cyanosis, bilateral severe venous stasis changes.  BLE 2-3 + edema 1/2 way to knees.  Chronic venous stasis changes with multiple bilateral ulcerations.     Assessment/Plan: 1. Acute/ Chronic  diastolic CHF:  NYHA class III symptoms.  - Volume status difficult.  She has chronic venous stasis changes that make edema difficult to quantify.  JVP difficult due to body habitus.   - Continue torsemide 100 mg BID.  - Increase metolazone 2.5 mg to Monday/Wed/Saturday with extra potassium.   - Continue spiro 25 mg daily.  -ECHO 11/30/15 LVEF 65-70%, Grade 1 DD.  2. OHS:  Followed by Dr Elsworth Soho, has previously stated no role for CPAP.  - Should use home oxygen continuously   3. Obesity:  - Still needs to watch her diet.  4. HTN:   - Stable on current regimen.    5. Hyperlipidemia:  High triglycerides.   - Continue current on Lovaza, Zetia, and fenofibrate.   6. Imbalance: - Very limiting. 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 7. CKD: Stage III.  - Creatinine up on most recent check.  BMET/BNP today.  8. PAD: Bilateral iliac disease on aortoiliac dopplers in 7/17.  Saw Dr Fletcher Anon, plan for medical management.  She is on ASA and has not been able to tolerate statins. 9. Murmur: Has had vigorous LV contraction with LV mid-cavity gradient in the past (is on Coreg).  ECHO pending.   10. Venous stasis dermatitis: Suspect with superimposed cellulitis.  - Will prescribe doxycycline 100 mg BID x 10 days.  - Will refer to wound center. Needs to be seen ASAP    Adding doxycycline and increasing metolazone as above. BMET, BNP, and CBC today.    Follow up next week for re-assessment of volume status.   Satira Mccallum Tillery PA-C  12/08/2015   Patient seen with PA, agree with the above note.    Legs look worse today.  There is marked lower leg redness bilaterally and weeping.  I think she has cellulitis.  - Will prescribe doxycycline 100 mg bid x 10 days.   - I will refer her to the wound center.   On exam, she remains volume overloaded.  Creatinine  rose not long ago and diuretics were cut back.  However, she is back to taking torsemide 100 mg bid and metolazone twice a week. - Increase metolazone to three times a week.  - BMET today and in 2 wks.   Loralie Champagne 12/09/2015

## 2015-12-11 ENCOUNTER — Other Ambulatory Visit: Payer: Self-pay | Admitting: Pharmacist

## 2015-12-11 NOTE — Patient Outreach (Signed)
[facility]) Care Management  12/11/2015  TRESURE BARLETTA 1945/07/06 [id number]  Successful phone outreach to patient, she verified HIPAA details.    Patient reports she is still interested in reviewing medications with [facility] Pharmacist but states today isn't a good day for it.  She wished to schedule time for next week but reports being unable to locate her calendar.   Plan:  [facility] Pharmacist will outreach patient early next week to see about scheduling time for a medication review with patient per patient request.   Karrie Meres, PharmD, [place] [phone]

## 2015-12-14 ENCOUNTER — Other Ambulatory Visit: Payer: Self-pay | Admitting: Pharmacist

## 2015-12-14 DIAGNOSIS — D6489 Other specified anemias: Secondary | ICD-10-CM | POA: Diagnosis not present

## 2015-12-14 DIAGNOSIS — N08 Glomerular disorders in diseases classified elsewhere: Secondary | ICD-10-CM | POA: Diagnosis not present

## 2015-12-14 DIAGNOSIS — G894 Chronic pain syndrome: Secondary | ICD-10-CM | POA: Diagnosis not present

## 2015-12-14 DIAGNOSIS — I5032 Chronic diastolic (congestive) heart failure: Secondary | ICD-10-CM | POA: Diagnosis not present

## 2015-12-14 DIAGNOSIS — Z1389 Encounter for screening for other disorder: Secondary | ICD-10-CM | POA: Diagnosis not present

## 2015-12-14 DIAGNOSIS — I10 Essential (primary) hypertension: Secondary | ICD-10-CM | POA: Diagnosis not present

## 2015-12-14 DIAGNOSIS — Z6841 Body Mass Index (BMI) 40.0 and over, adult: Secondary | ICD-10-CM | POA: Diagnosis not present

## 2015-12-14 DIAGNOSIS — D126 Benign neoplasm of colon, unspecified: Secondary | ICD-10-CM | POA: Diagnosis not present

## 2015-12-14 DIAGNOSIS — G4733 Obstructive sleep apnea (adult) (pediatric): Secondary | ICD-10-CM | POA: Diagnosis not present

## 2015-12-14 DIAGNOSIS — E784 Other hyperlipidemia: Secondary | ICD-10-CM | POA: Diagnosis not present

## 2015-12-14 DIAGNOSIS — E1129 Type 2 diabetes mellitus with other diabetic kidney complication: Secondary | ICD-10-CM | POA: Diagnosis not present

## 2015-12-14 DIAGNOSIS — I7389 Other specified peripheral vascular diseases: Secondary | ICD-10-CM | POA: Diagnosis not present

## 2015-12-14 NOTE — Patient Outreach (Signed)
[facility]) Care Management  12/14/2015  Melissa Myers February 04, 1946 [id number]  Successful phone outreach to patient---she verified HIPAA details.  Purpose of call was to confirm with patient a time for [facility] Pharmacist to contact patient to review her medications.   Patient states Friday at 1100 will work for her and she will write it on her calendar.    Plan:  Will outreach patient this week as scheduled for medication review.   Karrie Meres, PharmD, [place] [phone]

## 2015-12-15 ENCOUNTER — Encounter (HOSPITAL_COMMUNITY): Payer: PPO

## 2015-12-15 DIAGNOSIS — G4733 Obstructive sleep apnea (adult) (pediatric): Secondary | ICD-10-CM | POA: Diagnosis not present

## 2015-12-15 DIAGNOSIS — F900 Attention-deficit hyperactivity disorder, predominantly inattentive type: Secondary | ICD-10-CM | POA: Diagnosis not present

## 2015-12-15 DIAGNOSIS — F329 Major depressive disorder, single episode, unspecified: Secondary | ICD-10-CM | POA: Diagnosis not present

## 2015-12-15 DIAGNOSIS — Z0000 Encounter for general adult medical examination without abnormal findings: Secondary | ICD-10-CM | POA: Diagnosis not present

## 2015-12-15 DIAGNOSIS — N528 Other male erectile dysfunction: Secondary | ICD-10-CM | POA: Diagnosis not present

## 2015-12-15 DIAGNOSIS — E785 Hyperlipidemia, unspecified: Secondary | ICD-10-CM | POA: Diagnosis not present

## 2015-12-15 DIAGNOSIS — I10 Essential (primary) hypertension: Secondary | ICD-10-CM | POA: Diagnosis not present

## 2015-12-15 DIAGNOSIS — E1151 Type 2 diabetes mellitus with diabetic peripheral angiopathy without gangrene: Secondary | ICD-10-CM | POA: Diagnosis not present

## 2015-12-15 DIAGNOSIS — Z6831 Body mass index (BMI) 31.0-31.9, adult: Secondary | ICD-10-CM | POA: Diagnosis not present

## 2015-12-16 ENCOUNTER — Telehealth (HOSPITAL_COMMUNITY): Payer: Self-pay | Admitting: *Deleted

## 2015-12-16 MED ORDER — METOLAZONE 2.5 MG PO TABS
2.5000 mg | ORAL_TABLET | ORAL | 6 refills | Status: DC
Start: 2015-12-17 — End: 2016-05-24

## 2015-12-16 NOTE — Telephone Encounter (Signed)
[name], RN is out seeing pt today and states pt missed her appt with [place] yesterday so she is calling with update.  She states pt's wt is back down to 243 lb, down from 251 on 10/20, she would like to know if pt should make any changes to medication or continue current dose.  Discussed with Oda Kilts, PA he recommends pt get bmet today since she was supposed to get one done yesterday, he also recommends she decrease her metolazone to twice a week, take extra dose once a week for increased wt gain and f/u appt resch.  [name] is aware and will instruction pt on med changes, she will do bmet today and appt sch for 11/2

## 2015-12-18 ENCOUNTER — Other Ambulatory Visit: Payer: Self-pay | Admitting: Pharmacist

## 2015-12-18 NOTE — Patient Outreach (Signed)
[facility]) Care Management  12/18/2015  Melissa Myers [birth year] [id number]  Successful phone call to patient.  Patient verified HIPAA details. Was scheduled to call patient to complete medication review this morning, and today she states she forgot and is on her way out the door.    Patient requests to reschedule call.   Plan:  Medication review call rescheduled for next week.   Karrie Meres, PharmD, [place] [phone]

## 2015-12-22 ENCOUNTER — Other Ambulatory Visit: Payer: Self-pay | Admitting: Pharmacist

## 2015-12-22 NOTE — Patient Outreach (Signed)
[facility]) Care Management  12/22/2015  Melissa Myers 19-Nov-1945 [id number]  Successful phone outreach to patient, she verified HIPAA details, at previously agreed upon time for medication review.  Patient reports she isn't feeling well today.  [facility] Pharmacist has made multiple attempts to schedule medication review with patient and these have been rescheduled by patient request.   Provided patient with [facility] Pharmacist phone number, and she agrees to call [facility] Pharmacist at a later date if she is interested in reviewing her medications over the phone.   Plan:  Will close pharmacy case at this time---patient agrees to contact [place] if she is interested in reviewing her medications at a later date.   Karrie Meres, PharmD, [place] [phone]

## 2015-12-23 ENCOUNTER — Encounter (HOSPITAL_BASED_OUTPATIENT_CLINIC_OR_DEPARTMENT_OTHER): Payer: PPO

## 2015-12-24 ENCOUNTER — Inpatient Hospital Stay (HOSPITAL_COMMUNITY): Admission: RE | Admit: 2015-12-24 | Payer: PPO | Source: Ambulatory Visit

## 2015-12-25 ENCOUNTER — Other Ambulatory Visit (HOSPITAL_COMMUNITY): Payer: Self-pay | Admitting: Cardiology

## 2015-12-25 DIAGNOSIS — Z1212 Encounter for screening for malignant neoplasm of rectum: Secondary | ICD-10-CM | POA: Diagnosis not present

## 2015-12-28 ENCOUNTER — Other Ambulatory Visit: Payer: Self-pay | Admitting: Internal Medicine

## 2016-01-04 ENCOUNTER — Ambulatory Visit (HOSPITAL_COMMUNITY)
Admission: RE | Admit: 2016-01-04 | Discharge: 2016-01-04 | Disposition: A | Discharge status: Home / Self Care | Payer: PPO | Source: Ambulatory Visit | Attending: Cardiology | Admitting: Cardiology

## 2016-01-04 VITALS — BP 134/50 | HR 68 | Wt 246.4 lb

## 2016-01-04 DIAGNOSIS — L97919 Non-pressure chronic ulcer of unspecified part of right lower leg with unspecified severity: Secondary | ICD-10-CM

## 2016-01-04 DIAGNOSIS — Z79899 Other long term (current) drug therapy: Secondary | ICD-10-CM | POA: Insufficient documentation

## 2016-01-04 DIAGNOSIS — L97929 Non-pressure chronic ulcer of unspecified part of left lower leg with unspecified severity: Secondary | ICD-10-CM

## 2016-01-04 DIAGNOSIS — R2689 Other abnormalities of gait and mobility: Secondary | ICD-10-CM | POA: Insufficient documentation

## 2016-01-04 DIAGNOSIS — I5032 Chronic diastolic (congestive) heart failure: Secondary | ICD-10-CM

## 2016-01-04 DIAGNOSIS — E781 Pure hyperglyceridemia: Secondary | ICD-10-CM | POA: Diagnosis not present

## 2016-01-04 DIAGNOSIS — E1122 Type 2 diabetes mellitus with diabetic chronic kidney disease: Secondary | ICD-10-CM | POA: Insufficient documentation

## 2016-01-04 DIAGNOSIS — I10 Essential (primary) hypertension: Secondary | ICD-10-CM

## 2016-01-04 DIAGNOSIS — I130 Hypertensive heart and chronic kidney disease with heart failure and stage 1 through stage 4 chronic kidney disease, or unspecified chronic kidney disease: Secondary | ICD-10-CM | POA: Insufficient documentation

## 2016-01-04 DIAGNOSIS — E785 Hyperlipidemia, unspecified: Secondary | ICD-10-CM | POA: Insufficient documentation

## 2016-01-04 DIAGNOSIS — Z7982 Long term (current) use of aspirin: Secondary | ICD-10-CM | POA: Insufficient documentation

## 2016-01-04 DIAGNOSIS — I5033 Acute on chronic diastolic (congestive) heart failure: Secondary | ICD-10-CM | POA: Insufficient documentation

## 2016-01-04 DIAGNOSIS — Z794 Long term (current) use of insulin: Secondary | ICD-10-CM | POA: Insufficient documentation

## 2016-01-04 DIAGNOSIS — I83029 Varicose veins of left lower extremity with ulcer of unspecified site: Secondary | ICD-10-CM

## 2016-01-04 DIAGNOSIS — Z882 Allergy status to sulfonamides status: Secondary | ICD-10-CM | POA: Diagnosis not present

## 2016-01-04 DIAGNOSIS — Z885 Allergy status to narcotic agent status: Secondary | ICD-10-CM | POA: Insufficient documentation

## 2016-01-04 DIAGNOSIS — Z8249 Family history of ischemic heart disease and other diseases of the circulatory system: Secondary | ICD-10-CM | POA: Diagnosis not present

## 2016-01-04 DIAGNOSIS — G2581 Restless legs syndrome: Secondary | ICD-10-CM | POA: Diagnosis not present

## 2016-01-04 DIAGNOSIS — Z6841 Body Mass Index (BMI) 40.0 and over, adult: Secondary | ICD-10-CM | POA: Diagnosis not present

## 2016-01-04 DIAGNOSIS — E784 Other hyperlipidemia: Secondary | ICD-10-CM

## 2016-01-04 DIAGNOSIS — I872 Venous insufficiency (chronic) (peripheral): Secondary | ICD-10-CM | POA: Diagnosis not present

## 2016-01-04 DIAGNOSIS — E662 Morbid (severe) obesity with alveolar hypoventilation: Secondary | ICD-10-CM | POA: Diagnosis not present

## 2016-01-04 DIAGNOSIS — I739 Peripheral vascular disease, unspecified: Secondary | ICD-10-CM | POA: Diagnosis not present

## 2016-01-04 DIAGNOSIS — I83019 Varicose veins of right lower extremity with ulcer of unspecified site: Secondary | ICD-10-CM

## 2016-01-04 DIAGNOSIS — N183 Chronic kidney disease, stage 3 (moderate): Secondary | ICD-10-CM | POA: Insufficient documentation

## 2016-01-04 DIAGNOSIS — E669 Obesity, unspecified: Secondary | ICD-10-CM | POA: Insufficient documentation

## 2016-01-04 DIAGNOSIS — G6289 Other specified polyneuropathies: Secondary | ICD-10-CM

## 2016-01-04 DIAGNOSIS — E1142 Type 2 diabetes mellitus with diabetic polyneuropathy: Secondary | ICD-10-CM | POA: Diagnosis not present

## 2016-01-04 DIAGNOSIS — E7849 Other hyperlipidemia: Secondary | ICD-10-CM

## 2016-01-04 LAB — BASIC METABOLIC PANEL
Anion gap: 12 (ref 5–15)
BUN: 23 mg/dL — ABNORMAL HIGH (ref 6–20)
CHLORIDE: 95 mmol/L — AB (ref 101–111)
CO2: 26 mmol/L (ref 22–32)
Calcium: 9.1 mg/dL (ref 8.9–10.3)
Creatinine, Ser: 1 mg/dL (ref 0.44–1.00)
GFR calc Af Amer: >60 mL/min (ref >60)
GFR, EST NON AFRICAN AMERICAN: 56 mL/min — AB (ref >60)
GLUCOSE: 335 mg/dL — AB (ref 65–99)
POTASSIUM: 3.8 mmol/L (ref 3.5–5.1)
SODIUM: 133 mmol/L — AB (ref 135–145)

## 2016-01-04 LAB — BRAIN NATRIURETIC PEPTIDE: B NATRIURETIC PEPTIDE 5: 63.2 pg/mL (ref 0.0–100.0)

## 2016-01-04 NOTE — Patient Instructions (Signed)
Routine lab work today. Will notify you of abnormal results, otherwise no news is good news!  Will refer you to the [facility]. Address: [address] [place], [place] [address] Hours: 8:30AM-5PM Phone: [phone]   Follow up 6 weeks with Dr. Aundra Dubin.  Do the following things EVERYDAY: 1) Weigh yourself in the morning before breakfast. Write it down and keep it in a log. 2) Take your medicines as prescribed 3) Eat low salt foods-Limit salt (sodium) to 2000 mg per day.  4) Stay as active as you can everyday 5) Limit all fluids for the day to less than 2 liters

## 2016-01-04 NOTE — Progress Notes (Signed)
Patient ID: Melissa Myers, female   DOB: [birth year], 70 y.o.   MRN: [id number]   PCP: Dr. Sharlet Salina Endocrinologist: Dr Loanne Drilling Cardiology: Dr Aundra Dubin.   70 yo with history of HTN, DM, hyperlipidemia, OHS/OSA, and chronic dyspnea/diastolic CHF.  Patient had an echo in 04/2009 showing moderate LVH and preserved LV systolic function. However, there was a very large LV mid-cavity gradient with valsalva.  Patient developed quite significant exertional dyspnea to the point where she was short of breath walking around her house. She had a pulmonary evaluation with Dr. Elsworth Soho but no primary lung problems were identified. Patient had an ETT-myoview in 05/2009 which was negative for ischemia or infarction. Right heart cath in 05/2009 showed mildly elevated right heart filling pressures but normal PA pressure and normal PCWP. She was started her on a beta blocker (Coreg) to try to lower her LV mid-cavity gradient (that likely occurs with exertion) and to better control BP.  V/Q scan was negative for PE.  PFTs from 06/2011 showed a restrictive defect. Last echo in 11/2013 showed EF 65% with normal RV size and systolic function.      Admitted 12/05/13 with volume overload. Diuresed with IV lasix and transitioned to torsemide 80 mg twice a day + metolazone. Overall she diuresed 21 pounds. Discharge weight was 263 pounds.   She was admitted in 9/16 with Strep agalactiae sepsis likely from cellulitis.    Admitted 12/1 though 01/26/15 with marked volume overload and cellulitis. Diuresed with Lasix drip and placed on antibiotics. Overall diuresed 20 pounds.   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  Most of leg pain is likely due to neuropathy.    She returns for today for follow up. At last visit given coarse of doxycycline for ? Cellulitis. Legs feel much better. She is having neuropathy in her feet, chronically, but seems to be flaring.  Now back  to taking metolazone twice weekly with improvement in weight. Weight down 9 lbs since last visit. Trying to cut back on ice. [facility] both following.  Watching her food more carefully. Less salt.   Labs (10/13): K 4.1, creatinine 0.85 Labs (4/14): K 3.7, creatinine 1.2, BUN 43, BNP 23 Labs (5/14): K 4.1, creatinine 1.0 Labs (7/14): K 4.6 Creatinine 1.0 BNP 39.0  Labs (8/14): K 3.5, creatinine 0.91, BNP 71 Labs (11/14): K 3.7, creatinine 0.9 Labs (12/14): K 4.1, creatinine 0.8, BNP 52 Labs (3/15): K 4, creatinine 0.9 Labs (4/15): K 3.7, creatinine 0.9 Labs (5/15): K 3.8, creatinine 0.9, BNP 29 Labs (6/15): K 3.4, BUN 105, creatinine 0.9 =>1.7 Labs (6/17/5) K 4.2, BUN 41, Creatinine 0.80 Labs (08/19/13) K 3.2, BUN 71, creatinine 1.5 Labs 09/05/13 K 4.1 Creatinine 1.1 Labs 11/07/13 K 3.6 Creatinine 0.91 Labs 12/11/13 K 4.5 Creatine 1.05 Labs (12/15): K 3.8, creatinine 1.03 Labs (03/17/2014): K 4.1 Creatinine 0.99  Labs (4/16): K 3.7, creatinine 1.24 Labs (5/16): LDL 108, TGs 699 Labs (07/24/2014) K 3.5 Creatinine 1.17  Labs (07/31/2014) K 3.3 Creatinine 1.13 Labs (9/16): K 3.5, creatinine 0.93 Labs (01/26/2015) K 3.6 Creatinine 1.36  Labs 04/22/15 K 3.8, Creatinine 1.66 Labs (4/17): K 4.6, creatinine 1.74, HCT 37.9 Labs (9/17): K 2.8, creatinine 1.67, glucose 505, BNP 20, hgb 12.4 Labs (11/19/2015) : K 3.2 Creatinine 2.25  Labs (11/27/2015): K 4.4 Creatinine 1.26   Allergies (verified):  1) ! Sulfa  2) Morphine   Past Medical History:  1. Diabetes mellitus,  type II  2. Hyperlipidemia: She has been unable to tolerate statins (has been on Vytorin, lovastatin, and Crestor) due to what sounds like rhabdo: developed muscle weakness and was told she had "muscle damage" with each of these statins. She has been told not to take statins anymore.  Has hypertriglyceridemia.  3. Hypertension 4. Obesity  5. Restless Leg Syndrome  6. OA  7. peripheral neuropathy  8. OHS/OSA: Uses  supplemental oxygen, does not tolerate CPAP.   9. Chronic nausea/diarrhea: ? IBS  10. Diastolic CHF: Echo ([id number]) with normal LV size, moderate LVH, EF 65-70%, LV mid-cavity gradient reaching 63 mmHg with valsalva but minimal at rest, grade I diastolic dysfunction, cannot estimate PA systolic pressure (no TR doppler signal), RV normal. RHC (4/11): Mean RA 11, RV 37/13, PA 33/15, mean PCWP 12, CI 3.3. Echo in 11/12 showed mild LVH, EF 65-70%, no LV mid-cavity gradient was mentioned.  Echo in 4/14 showed EF 65-70%, mild LVH, grade I diastolic dysfunction, PA systolic pressure 35 mmHg, mild LV mid-cavity gradient.  Echo (5/15) with EF 60-65%, mild TR, normal RV size and systolic function. Echo (10/15) with EF 65%, moderate LVH, normal RV.  11. ETT-myoview (4/11): 5'30", stopped due to fatigue, normal EF, no evidence for scar or ischemia. 12. V/Q scan 11/12: negative for PE.  Lower extremity venous doppler US (3/14): negative for DVT.  13. PFTs (5/13): FVC 50%, FEV1 62%, ratio 106%, DLCO 69%, TLC 69%.  27. CKD  Family History:  Family History of Alcoholism/Addiction (parent)  Family History Diabetes 1st degree relative (grandparent)  Family History High cholesterol (parent, grandparent)  Family History Hypertension (parent, grandparent)  Family History Lung cancer (grandparent)  Stomach cancer (grandmother)  Celiac Sprue daughter  Mother with MI at 28, father with MI at 62, uncle with MI at 55, brother with stents in his 78s, multiple aunts with MIs and CVAs   Social History:  Never Smoked  no alcohol  married, lives with spouse and her mother  retired Network engineer Alcohol Use - no  Illicit Drug Use - no  Review of Systems  All systems reviewed and negative except as per HPI.   ROS: All systems reviewed and negative except as per HPI.   Current Outpatient Prescriptions  Medication Sig Dispense Refill  . aspirin EC 81 MG tablet Take 1 tablet (81 mg total) by mouth daily.    . B Complex-C  (B-COMPLEX WITH VITAMIN C) tablet Take 1 tablet by mouth daily.    . Blood Glucose Calibration (ONETOUCH VERIO) SOLN Use to check sugars up to 4 times per day 1 each 12  . carvedilol (COREG) 3.125 MG tablet Take 1 tablet (3.125 mg total) by mouth 2 (two) times daily with a meal. 60 tablet 0  . Cholecalciferol (VITAMIN D3) 2000 units capsule Take 1 capsule (2,000 Units total) by mouth daily. 90 capsule 3  . citalopram (CELEXA) 20 MG tablet TAKE 1 TABLET (20 MG TOTAL) BY MOUTH DAILY. 30 tablet 2  . clotrimazole (MYCELEX) 10 MG troche Take 1 tablet (10 mg total) by mouth 5 (five) times daily. 140 tablet 2  . clotrimazole-betamethasone (LOTRISONE) cream Apply 1 application topically 2 (two) times daily. 45 g 2  . Coenzyme Q10 200 MG capsule Take 200 mg by mouth daily.    . diclofenac sodium (VOLTAREN) 1 % GEL Apply 2 g topically daily as needed (knee pain). 10 g 3  . DULoxetine (CYMBALTA) 60 MG capsule Take 1 capsule (60 mg total) by mouth  daily. 90 capsule 1  . ezetimibe (ZETIA) 10 MG tablet TAKE 1 TABLET (10 MG TOTAL) BY MOUTH DAILY. 90 tablet 0  . fenofibrate 160 MG tablet TAKE 1 TABLET (160 MG TOTAL) BY MOUTH DAILY. MUST ESTABLISH WITH NEW PCP FOR ADDITIONAL REFILLS. 90 tablet 3  . gabapentin (NEURONTIN) 300 MG capsule TAKE TWO CAPSULES BY MOUTH FOUR TIMES A DAY 240 capsule 2  . glucose blood (ONETOUCH VERIO) test strip Use as instructed up to 4 times per day 200 each 12  . hydrALAZINE (APRESOLINE) 25 MG tablet TAKE 1 TABLET (25 MG TOTAL) BY MOUTH 3 (THREE) TIMES DAILY. 90 tablet 2  . HYDROcodone-acetaminophen (NORCO) 10-325 MG tablet Take 1 tablet by mouth every 6 (six) hours as needed. 120 tablet 0  . Insulin Glargine (LANTUS SOLOSTAR) 100 UNIT/ML Solostar Pen Inject 75 Units into the skin daily at 10 pm.    . insulin NPH-regular Human (NOVOLIN 70/30) (70-30) 100 UNIT/ML injection Inject 100 Units into the skin daily with breakfast.    . LANTUS SOLOSTAR 100 UNIT/ML Solostar Pen INJECT 50 UNITS  UNDER THE SKIN EVERY EVENING AT 10:00 PM 15 pen 0  . Magnesium 500 MG TABS Take 500 mg by mouth daily.    . metolazone (ZAROXOLYN) 2.5 MG tablet Take 1 tablet (2.5 mg total) by mouth 2 (two) times a week. 15 tablet 6  . omega-3 acid ethyl esters (LOVAZA) 1 g capsule Take 2 capsules (2 g total) by mouth 2 (two) times daily. 270 capsule 3  . potassium chloride SA (K-DUR,KLOR-CON) 20 MEQ tablet Take 60 mEq by mouth 2 (two) times daily.    [name] rOPINIRole (REQUIP XL) 4 MG 24 hr tablet TAKE 1 TABLET (4 MG TOTAL) BY MOUTH AT BEDTIME. 30 tablet 1  . rOPINIRole (REQUIP) 2 MG tablet Take 1 tablet (2 mg total) by mouth 3 (three) times daily as needed. 270 tablet 3  . spironolactone (ALDACTONE) 25 MG tablet TAKE 1 TABLET BY MOUTH DAILY 30 tablet 2  . torsemide (DEMADEX) 100 MG tablet Take 1 tablet (100 mg total) by mouth 2 (two) times daily. 60 tablet 3  . vitamin B-12 (CYANOCOBALAMIN) 500 MCG tablet Take 500 mcg by mouth daily. Reported on 09/09/2015     No current facility-administered medications for this encounter.    Vitals:   01/04/16 1451  BP: (!) 134/50  BP Location: Left Arm  Patient Position: Sitting  Cuff Size: Large  Pulse: 68  SpO2: 93%  Weight: 246 lb 6.4 oz (111.8 kg)   Wt Readings from Last 3 Encounters:  01/04/16 246 lb 6.4 oz (111.8 kg)  12/08/15 255 lb 3.2 oz (115.8 kg)  11/19/15 236 lb 8 oz (107.3 kg)     General: NAD, obese. Husband and Mother present. In wheelchair.    Neck: Thick, JVP difficult to assess but still appears elevated. No thyromegaly or thyroid nodule.  Lungs: Clear, normal effort.  CV: Nondisplaced PMI.  Heart regular S1/S2, no [id number], 3/6 systolic murmur along sternal border.  No carotid bruit.  Abdomen: obese, soft, NT, ND, no HSM. No bruits or masses. +BS   Neurologic: Alert and oriented x 3.  Psych: Normal affect. Extremities: No clubbing/cyanosis, Bilateral venous stasis changes. 1+ ankle edema. Bilateral ulcerations.      Assessment/Plan: 1. Acute/  Chronic diastolic CHF:  NYHA class III symptoms.  - Volume status difficult.  She has chronic venous stasis changes that make edema difficult to quantify.  JVP difficult due to body habitus.   -  Continue torsemide 100 mg BID.  - Continue metolazone 2.5 mg twice weekly with extra potassium. Can take an extra as needed for weight gain.  - Continue spiro 25 mg daily.  -ECHO 11/30/15 LVEF 65-70%, Grade 1 DD.  2. OHS:  Followed by Dr Elsworth Soho, has previously stated no role for CPAP.  - Should use home oxygen continuously   3. Obesity:  - Needs to limit portions and increase activity.  4. HTN:   - Stable on current regimen.  5. Hyperlipidemia:  High triglycerides.   - Continue current on Lovaza, Zetia, and fenofibrate.   6. Imbalance: - Very limiting. 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 7. CKD Stage III - BMET/BNP today.  8. PAD: Bilateral iliac disease on aortoiliac dopplers in 7/17.  Saw Dr Fletcher Anon, plan for medical management.  She is on ASA and has not been able to tolerate statins. 9. Murmur: Has had vigorous LV contraction with LV mid-cavity gradient in the past (is on Coreg).  ECHO pending.   10. Venous stasis dermatitis:  - Improved after course of doxycycline.  - Hasn't heard back from [place]. Will check on her referral.     Volume status much improved. Will check on referral to wound clinic. BMET/BNP today.   Satira Mccallum Tillery PA-C  01/04/2016

## 2016-01-05 ENCOUNTER — Telehealth: Payer: Self-pay | Admitting: *Deleted

## 2016-01-05 MED ORDER — HYDROCODONE-ACETAMINOPHEN 10-325 MG PO TABS: 1.0000 | ORAL_TABLET | Freq: Four times a day (QID) | ORAL | 0 refills | Status: DC | PRN

## 2016-01-05 NOTE — Telephone Encounter (Signed)
Tried calling pt no  Answer x's 10 rings rx ready for pick-up...Melissa Myers

## 2016-01-05 NOTE — Telephone Encounter (Signed)
Notified pt rx ready for pick-up.../lmb 

## 2016-01-05 NOTE — Telephone Encounter (Signed)
Rec'd call pt requesting refill on Hydrocodone.../lmb 

## 2016-01-05 NOTE — Telephone Encounter (Signed)
Printed and signed.  

## 2016-01-12 DIAGNOSIS — H11001 Unspecified pterygium of right eye: Secondary | ICD-10-CM | POA: Diagnosis not present

## 2016-01-12 DIAGNOSIS — H18413 Arcus senilis, bilateral: Secondary | ICD-10-CM | POA: Diagnosis not present

## 2016-01-12 DIAGNOSIS — H02839 Dermatochalasis of unspecified eye, unspecified eyelid: Secondary | ICD-10-CM | POA: Diagnosis not present

## 2016-01-12 DIAGNOSIS — H25013 Cortical age-related cataract, bilateral: Secondary | ICD-10-CM | POA: Diagnosis not present

## 2016-01-13 ENCOUNTER — Telehealth (HOSPITAL_COMMUNITY): Payer: Self-pay | Admitting: *Deleted

## 2016-01-13 MED ORDER — CARVEDILOL 25 MG PO TABS: 25.0000 mg | ORAL_TABLET | Freq: Two times a day (BID) | ORAL | Status: DC

## 2016-01-13 NOTE — Telephone Encounter (Signed)
RN called to report that upon review of meds pt hs Carvedilol 25 mg BID, she is unsure how long pt has been on this dose.  She knows that at 1 time pt was on 3.125 mg BID, she called pt's pharmacy, they reported they received a RX for 3.125 mg back in Aug but it was cancelled and pt has been getting the 25 mg BID.  She states pt is doing well, BP 140/58 today w/HR of 78.  Discussed w/Erika, pharm D ok for pt to remain on this dose, med list updated, she will advise pt

## 2016-01-18 ENCOUNTER — Other Ambulatory Visit (HOSPITAL_COMMUNITY): Payer: Self-pay | Admitting: Student

## 2016-01-18 ENCOUNTER — Other Ambulatory Visit: Payer: Self-pay | Admitting: Internal Medicine

## 2016-01-20 ENCOUNTER — Encounter (HOSPITAL_BASED_OUTPATIENT_CLINIC_OR_DEPARTMENT_OTHER): Payer: PPO | Attending: Surgery

## 2016-01-20 ENCOUNTER — Ambulatory Visit (HOSPITAL_COMMUNITY)
Admission: RE | Admit: 2016-01-20 | Discharge: 2016-01-20 | Disposition: A | Discharge status: Home / Self Care | Payer: PPO | Source: Ambulatory Visit | Attending: Surgery | Admitting: Surgery

## 2016-01-20 ENCOUNTER — Other Ambulatory Visit: Payer: Self-pay | Admitting: Surgery

## 2016-01-20 DIAGNOSIS — E1142 Type 2 diabetes mellitus with diabetic polyneuropathy: Secondary | ICD-10-CM | POA: Diagnosis not present

## 2016-01-20 DIAGNOSIS — E11621 Type 2 diabetes mellitus with foot ulcer: Secondary | ICD-10-CM | POA: Insufficient documentation

## 2016-01-20 DIAGNOSIS — Z7982 Long term (current) use of aspirin: Secondary | ICD-10-CM | POA: Insufficient documentation

## 2016-01-20 DIAGNOSIS — L97519 Non-pressure chronic ulcer of other part of right foot with unspecified severity: Secondary | ICD-10-CM | POA: Insufficient documentation

## 2016-01-20 DIAGNOSIS — M869 Osteomyelitis, unspecified: Secondary | ICD-10-CM

## 2016-01-20 DIAGNOSIS — L84 Corns and callosities: Secondary | ICD-10-CM | POA: Insufficient documentation

## 2016-01-20 DIAGNOSIS — G894 Chronic pain syndrome: Secondary | ICD-10-CM | POA: Insufficient documentation

## 2016-01-20 DIAGNOSIS — L97511 Non-pressure chronic ulcer of other part of right foot limited to breakdown of skin: Secondary | ICD-10-CM | POA: Insufficient documentation

## 2016-01-20 DIAGNOSIS — E785 Hyperlipidemia, unspecified: Secondary | ICD-10-CM | POA: Diagnosis not present

## 2016-01-20 DIAGNOSIS — I110 Hypertensive heart disease with heart failure: Secondary | ICD-10-CM | POA: Diagnosis not present

## 2016-01-20 DIAGNOSIS — I5032 Chronic diastolic (congestive) heart failure: Secondary | ICD-10-CM | POA: Diagnosis not present

## 2016-01-20 DIAGNOSIS — F329 Major depressive disorder, single episode, unspecified: Secondary | ICD-10-CM | POA: Diagnosis not present

## 2016-01-20 DIAGNOSIS — Z794 Long term (current) use of insulin: Secondary | ICD-10-CM | POA: Insufficient documentation

## 2016-01-20 DIAGNOSIS — M7989 Other specified soft tissue disorders: Secondary | ICD-10-CM | POA: Diagnosis not present

## 2016-01-20 DIAGNOSIS — Z79899 Other long term (current) drug therapy: Secondary | ICD-10-CM | POA: Insufficient documentation

## 2016-01-20 DIAGNOSIS — L97512 Non-pressure chronic ulcer of other part of right foot with fat layer exposed: Secondary | ICD-10-CM | POA: Diagnosis not present

## 2016-01-20 LAB — GLUCOSE, CAPILLARY: Glucose-Capillary: 412 mg/dL — ABNORMAL HIGH (ref 65–99)

## 2016-01-27 ENCOUNTER — Encounter (HOSPITAL_BASED_OUTPATIENT_CLINIC_OR_DEPARTMENT_OTHER): Payer: PPO | Attending: Surgery

## 2016-01-27 ENCOUNTER — Other Ambulatory Visit: Payer: Self-pay | Admitting: Internal Medicine

## 2016-01-27 DIAGNOSIS — L84 Corns and callosities: Secondary | ICD-10-CM | POA: Insufficient documentation

## 2016-01-27 DIAGNOSIS — E1151 Type 2 diabetes mellitus with diabetic peripheral angiopathy without gangrene: Secondary | ICD-10-CM | POA: Insufficient documentation

## 2016-01-27 DIAGNOSIS — E11621 Type 2 diabetes mellitus with foot ulcer: Secondary | ICD-10-CM | POA: Diagnosis not present

## 2016-01-27 DIAGNOSIS — L97512 Non-pressure chronic ulcer of other part of right foot with fat layer exposed: Secondary | ICD-10-CM | POA: Insufficient documentation

## 2016-01-27 DIAGNOSIS — I10 Essential (primary) hypertension: Secondary | ICD-10-CM | POA: Insufficient documentation

## 2016-01-27 DIAGNOSIS — G894 Chronic pain syndrome: Secondary | ICD-10-CM | POA: Diagnosis not present

## 2016-01-27 DIAGNOSIS — Z6841 Body Mass Index (BMI) 40.0 and over, adult: Secondary | ICD-10-CM | POA: Diagnosis not present

## 2016-01-27 DIAGNOSIS — E1140 Type 2 diabetes mellitus with diabetic neuropathy, unspecified: Secondary | ICD-10-CM | POA: Diagnosis not present

## 2016-01-28 ENCOUNTER — Other Ambulatory Visit: Payer: Self-pay | Admitting: Internal Medicine

## 2016-01-28 ENCOUNTER — Telehealth: Payer: Self-pay | Admitting: Emergency Medicine

## 2016-01-28 NOTE — Telephone Encounter (Signed)
Patient has an appt to see endo on 12/20. She left Dr. Loanne Drilling. She will be going to see Dr. Forde Dandy from now on. I spoke with patient and I am going to have someone from scheduling to call patient as she is willing to come in for an office visit. I left [name] from the home health agency a message asking her which provider ordered labs and informing her to call that provider to discuss lab results.

## 2016-01-28 NOTE — Telephone Encounter (Signed)
Patient is non-compliant and has many issues. [name] saw patient yesterday and wanted to share with PCP that weight was 238 last Wednesday and that was the lowest it has been since they have been seeing her. [name] saw patient yesterday and the patient would not let them weigh her. Patient reported that her weight was 248 on Sunday. Patients sugars have been ranging from 143 - the 500's. Labs were drawn yesterday. Sodium was 126 and her chloride was 84, glucose was 602, BUN 50, creatinine  was 1.56. [name] thinks patient needs to be seen. She wants advisement from PCP.

## 2016-01-28 NOTE — Telephone Encounter (Signed)
[facility] called and wants you to call them back about patients labs and patients condition. Her blood glucose is remaining high and she needs to talk about this as well. Please advise thanks.

## 2016-01-28 NOTE — Telephone Encounter (Signed)
Has she seen her endocrinologist recently? Would be happy to see patient if she is willing to come in. Recommend 30 minute visit. Did someone else order the labs as we did not? They should call results of labs to ordering provider.

## 2016-02-02 ENCOUNTER — Other Ambulatory Visit: Payer: Self-pay | Admitting: Internal Medicine

## 2016-02-02 ENCOUNTER — Ambulatory Visit: Payer: PPO | Admitting: Internal Medicine

## 2016-02-03 ENCOUNTER — Other Ambulatory Visit: Payer: Self-pay | Admitting: Internal Medicine

## 2016-02-04 ENCOUNTER — Encounter: Payer: Self-pay | Admitting: Internal Medicine

## 2016-02-04 ENCOUNTER — Ambulatory Visit (INDEPENDENT_AMBULATORY_CARE_PROVIDER_SITE_OTHER): Payer: PPO | Admitting: Internal Medicine

## 2016-02-04 ENCOUNTER — Other Ambulatory Visit (INDEPENDENT_AMBULATORY_CARE_PROVIDER_SITE_OTHER): Payer: PPO

## 2016-02-04 VITALS — BP 98/48 | HR 62 | Temp 97.5°F | Resp 18 | Wt 237.0 lb

## 2016-02-04 DIAGNOSIS — L03115 Cellulitis of right lower limb: Secondary | ICD-10-CM | POA: Diagnosis not present

## 2016-02-04 DIAGNOSIS — E1122 Type 2 diabetes mellitus with diabetic chronic kidney disease: Secondary | ICD-10-CM | POA: Diagnosis not present

## 2016-02-04 DIAGNOSIS — G894 Chronic pain syndrome: Secondary | ICD-10-CM

## 2016-02-04 DIAGNOSIS — Z794 Long term (current) use of insulin: Secondary | ICD-10-CM

## 2016-02-04 DIAGNOSIS — N184 Chronic kidney disease, stage 4 (severe): Secondary | ICD-10-CM

## 2016-02-04 LAB — COMPREHENSIVE METABOLIC PANEL
ALBUMIN: 3.5 g/dL (ref 3.5–5.2)
ALK PHOS: 106 U/L (ref 39–117)
ALT: 17 U/L (ref 0–35)
AST: 21 U/L (ref 0–37)
BILIRUBIN TOTAL: 0.4 mg/dL (ref 0.2–1.2)
BUN: 49 mg/dL — ABNORMAL HIGH (ref 6–23)
CALCIUM: 9.4 mg/dL (ref 8.4–10.5)
CO2: 38 mEq/L — ABNORMAL HIGH (ref 19–32)
Chloride: 84 mEq/L — ABNORMAL LOW (ref 96–112)
Creatinine, Ser: 1.49 mg/dL — ABNORMAL HIGH (ref 0.40–1.20)
GFR: 36.71 mL/min — AB (ref >60.00)
GLUCOSE: 492 mg/dL — AB (ref 70–99)
Potassium: 3.9 mEq/L (ref 3.5–5.1)
Sodium: 130 mEq/L — ABNORMAL LOW (ref 135–145)
TOTAL PROTEIN: 7 g/dL (ref 6.0–8.3)

## 2016-02-04 LAB — HEMOGLOBIN A1C

## 2016-02-04 MED ORDER — MELOXICAM 7.5 MG PO TABS: 7.5000 mg | ORAL_TABLET | Freq: Every day | ORAL | 0 refills | Status: DC

## 2016-02-04 MED ORDER — HYDROCODONE-ACETAMINOPHEN 10-325 MG PO TABS: 1.0000 | ORAL_TABLET | Freq: Four times a day (QID) | ORAL | 0 refills | Status: DC | PRN

## 2016-02-04 MED ORDER — CLINDAMYCIN HCL 300 MG PO CAPS: 300.0000 mg | ORAL_CAPSULE | Freq: Three times a day (TID) | ORAL | 0 refills | Status: DC

## 2016-02-04 NOTE — Progress Notes (Signed)
Pre visit review using our clinic review tool, if applicable. No additional management support is needed unless otherwise documented below in the visit note. 

## 2016-02-04 NOTE — Patient Instructions (Addendum)
We have sent in mobic. We have given you the hydrocodone prescription.   We have sent in an antibiotic clindamycin for the cellulitis on the feet. Take 1 pill 3 times a day for 1 week. Call back if the area is not improved with the medicine.   We are checking the labs today.   We would like you to keep the 70/30 at 100 units in the morning.   Do 75 units of lantus in the evening.   Take a sugar log to Dr. Baldwin Crown appointment.

## 2016-02-04 NOTE — Progress Notes (Signed)
   Subjective:    Patient ID: Melissa Myers, female    DOB: [birth year], 70 y.o.   MRN: [id number]  HPI The patient is a 70 YO female coming in for foot pain. She is also following up on her sugars (they have seen the endocrine doctor although they did not make any changes yet and they have not called them about their high sugars at all). Glucose 462 this morning. 70/30 100 units in the morning and lantus 50 units in the evening. She is feeling tired a lot of the time. She denies stomach pain or nausea or vomiting.  She is using her husband's mobic for some of her pain and it helps some and she wants to know if she can get a prescription for that. She is having severe pain in her feet. They have turned red on the tops which is worse in the last 1-2 weeks. She has had debridement of her callus over the last several months. It is not hurting anymore. Her feet are swollen as usual and maybe slightly worse than usual. She is also out of her hydrocodone  Review of Systems  Constitutional: Positive for activity change and fatigue. Negative for appetite change, chills, fever and unexpected weight change.  HENT: Negative.   Eyes: Negative.   Respiratory: Negative.   Cardiovascular: Positive for leg swelling. Negative for chest pain and palpitations.  Gastrointestinal: Negative for abdominal distention, abdominal pain, constipation, diarrhea and nausea.  Musculoskeletal: Positive for arthralgias, gait problem and myalgias. Negative for joint swelling.  Skin: Positive for rash and wound.  Neurological: Positive for weakness and numbness. Negative for seizures.  Psychiatric/Behavioral: Negative.       Objective:   Physical Exam  Constitutional: She appears well-developed and well-nourished.  Obese  HENT:  Head: Normocephalic and atraumatic.  Eyes: EOM are normal.  Neck: Normal range of motion.  Cardiovascular: Normal rate and regular rhythm.   Pulmonary/Chest: Effort normal. No respiratory  distress. She has no wheezes. She has no rales.  Abdominal: Soft. Bowel sounds are normal. She exhibits no distension. There is no tenderness. There is no rebound.  Musculoskeletal: She exhibits edema.  Edema bilateral feet and legs to mid shin, right slightly more than left  Neurological: She is alert. Coordination abnormal.  Manual wheelchair  Skin: Skin is warm and dry. There is erythema.  Right great toe with scab covered callus lateral toe with tenderness, the top of the foot is red and warm with swelling in the foot to the mid shin.    Vitals:   02/04/16 1608  BP: (!) 98/48  Pulse: 62  Resp: 18  Temp: 97.5 F (36.4 C)  TempSrc: Oral  SpO2: 95%  Weight: 237 lb (107.5 kg)      Assessment & Plan:

## 2016-02-05 DIAGNOSIS — L03115 Cellulitis of right lower limb: Secondary | ICD-10-CM | POA: Insufficient documentation

## 2016-02-05 NOTE — Assessment & Plan Note (Signed)
Rx for clindamycin with her current allergies. This is complicated by the fact of her diabetes. Needs follow up visit soon for clearance.

## 2016-02-05 NOTE — Assessment & Plan Note (Signed)
Rx refill for her hydrocodone. She is not experiencing constipation or diarrhea. She is aware that the goal is not pain free but less pain and more mobility.

## 2016-02-05 NOTE — Assessment & Plan Note (Signed)
Severely out of control. Concern that her current cellulitis on her foot could be worsening her control. They have a visit with endo on 12/20 and they are encouraged to take sugar log or meter with them to that visit. They are asked to continue with 100 units of 70/30 insulin in the morning and increase to 75 units of lantus in the evening. They will do this. Checking BMP for DKA. She is severely elevated with her sugars and not controlled at all.

## 2016-02-09 ENCOUNTER — Ambulatory Visit (HOSPITAL_COMMUNITY)
Admission: RE | Admit: 2016-02-09 | Discharge: 2016-02-09 | Disposition: A | Discharge status: Home / Self Care | Payer: PPO | Source: Ambulatory Visit | Attending: Cardiology | Admitting: Cardiology

## 2016-02-09 ENCOUNTER — Encounter: Payer: Self-pay | Admitting: Internal Medicine

## 2016-02-09 VITALS — BP 149/62 | HR 71

## 2016-02-09 DIAGNOSIS — R011 Cardiac murmur, unspecified: Secondary | ICD-10-CM

## 2016-02-09 DIAGNOSIS — I5032 Chronic diastolic (congestive) heart failure: Secondary | ICD-10-CM | POA: Diagnosis not present

## 2016-02-09 DIAGNOSIS — N183 Chronic kidney disease, stage 3 unspecified: Secondary | ICD-10-CM

## 2016-02-09 DIAGNOSIS — E784 Other hyperlipidemia: Secondary | ICD-10-CM | POA: Insufficient documentation

## 2016-02-09 DIAGNOSIS — E7849 Other hyperlipidemia: Secondary | ICD-10-CM

## 2016-02-09 DIAGNOSIS — I10 Essential (primary) hypertension: Secondary | ICD-10-CM | POA: Diagnosis not present

## 2016-02-09 LAB — LIPID PANEL
CHOLESTEROL: 232 mg/dL — AB (ref 0–200)
HDL: 45 mg/dL (ref >40)
LDL Cholesterol: 115 mg/dL — ABNORMAL HIGH (ref 0–99)
TRIGLYCERIDES: 362 mg/dL — AB (ref <150)
Total CHOL/HDL Ratio: 5.2 RATIO
VLDL: 72 mg/dL — ABNORMAL HIGH (ref 0–40)

## 2016-02-09 NOTE — Patient Instructions (Addendum)
Labs today  PLEASE MAKE SURE TO TAKE YOUR CLINDAMYCIN Three times a day   Your physician recommends that you schedule a follow-up appointment in: 1 MONTH

## 2016-02-10 ENCOUNTER — Other Ambulatory Visit: Payer: Self-pay

## 2016-02-10 DIAGNOSIS — I10 Essential (primary) hypertension: Secondary | ICD-10-CM | POA: Diagnosis not present

## 2016-02-10 DIAGNOSIS — Z6841 Body Mass Index (BMI) 40.0 and over, adult: Secondary | ICD-10-CM | POA: Diagnosis not present

## 2016-02-10 DIAGNOSIS — N08 Glomerular disorders in diseases classified elsewhere: Secondary | ICD-10-CM | POA: Diagnosis not present

## 2016-02-10 DIAGNOSIS — E1129 Type 2 diabetes mellitus with other diabetic kidney complication: Secondary | ICD-10-CM | POA: Diagnosis not present

## 2016-02-10 NOTE — Patient Outreach (Signed)
    Telephone call to patient who advised this RNCM she is currently being followed by [facility] for case management needs. Plan: Discharge patient from caseload, patient is being followed by another case management agency

## 2016-02-11 NOTE — Progress Notes (Signed)
Patient ID: Melissa Myers, female   DOB: 07-10-1945, 70 y.o.   MRN: [id number]   PCP: Dr. Sharlet Salina Endocrinologist: Dr Loanne Drilling Cardiology: Dr Aundra Dubin.   70 yo with history of HTN, DM, hyperlipidemia, OHS/OSA, and chronic dyspnea/diastolic CHF.  Patient had an echo in 04/2009 showing moderate LVH and preserved LV systolic function. However, there was a very large LV mid-cavity gradient with valsalva.  Patient developed quite significant exertional dyspnea to the point where she was short of breath walking around her house. She had a pulmonary evaluation with Dr. Elsworth Soho but no primary lung problems were identified. Patient had an ETT-myoview in 05/2009 which was negative for ischemia or infarction. Right heart cath in 05/2009 showed mildly elevated right heart filling pressures but normal PA pressure and normal PCWP. She was started her on a beta blocker (Coreg) to try to lower her LV mid-cavity gradient (that likely occurs with exertion) and to better control BP.  V/Q scan was negative for PE.  PFTs from 06/2011 showed a restrictive defect. Last echo in 11/2013 showed EF 65% with normal RV size and systolic function.      Admitted 12/05/13 with volume overload. Diuresed with IV lasix and transitioned to torsemide 80 mg twice a day + metolazone. Overall she diuresed 21 pounds. Discharge weight was 263 pounds.   She was admitted in 9/16 with Strep agalactiae sepsis likely from cellulitis.    Admitted 12/1 though 01/26/15 with marked volume overload and cellulitis. Diuresed with Lasix drip and placed on antibiotics. Overall diuresed 20 pounds.   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  Most of leg pain is likely due to neuropathy.    She returns for today for followup.  Weight is down.  Her main problems currently seem to be severe diabetic neuropathy in her feet and very poor blood glucose control.  She has developed redness in her  feet, greater on right.  PCP gave her clindamycin for possible cellulitis but she does not think that she is taking it.  Breathing is ok, main limitation currently to exercise is neuropathy.  No chest pain.  No orthopnea/PND.   Labs (10/13): K 4.1, creatinine 0.85 Labs (4/14): K 3.7, creatinine 1.2, BUN 43, BNP 23 Labs (5/14): K 4.1, creatinine 1.0 Labs (7/14): K 4.6 Creatinine 1.0 BNP 39.0  Labs (8/14): K 3.5, creatinine 0.91, BNP 71 Labs (11/14): K 3.7, creatinine 0.9 Labs (12/14): K 4.1, creatinine 0.8, BNP 52 Labs (3/15): K 4, creatinine 0.9 Labs (4/15): K 3.7, creatinine 0.9 Labs (5/15): K 3.8, creatinine 0.9, BNP 29 Labs (6/15): K 3.4, BUN 105, creatinine 0.9 =>1.7 Labs (6/17/5) K 4.2, BUN 41, Creatinine 0.80 Labs (08/19/13) K 3.2, BUN 71, creatinine 1.5 Labs 09/05/13 K 4.1 Creatinine 1.1 Labs 11/07/13 K 3.6 Creatinine 0.91 Labs 12/11/13 K 4.5 Creatine 1.05 Labs (12/15): K 3.8, creatinine 1.03 Labs (03/17/2014): K 4.1 Creatinine 0.99  Labs (4/16): K 3.7, creatinine 1.24 Labs (5/16): LDL 108, TGs 699 Labs (07/24/2014) K 3.5 Creatinine 1.17  Labs (07/31/2014) K 3.3 Creatinine 1.13 Labs (9/16): K 3.5, creatinine 0.93 Labs (01/26/2015) K 3.6 Creatinine 1.36  Labs 04/22/15 K 3.8, Creatinine 1.66 Labs (4/17): K 4.6, creatinine 1.74, HCT 37.9 Labs (9/17): K 2.8, creatinine 1.67, glucose 505, BNP 20, hgb 12.4 Labs (11/19/2015) : K 3.2 Creatinine 2.25  Labs (11/27/2015): K 4.4 Creatinine 1.26  Labs (11/17): BNP 63 Labs (12/17): K 3.9, creatinine 1.49, hgbA1c 14.1  Allergies (verified):  1) ! Sulfa  2) Morphine   Past Medical History:  1. Diabetes mellitus, type II  2. Hyperlipidemia: She has been unable to tolerate statins (has been on Vytorin, lovastatin, and Crestor) due to what sounds like rhabdo: developed muscle weakness and was told she had "muscle damage" with each of these statins. She has been told not to take statins anymore.  Has hypertriglyceridemia.  3. Hypertension 4. Obesity    5. Restless Leg Syndrome  6. OA  7. Diabetic peripheral neuropathy  8. OHS/OSA: Uses supplemental oxygen, does not tolerate CPAP.   9. Chronic nausea/diarrhea: ? IBS  10. Diastolic CHF: Echo ([id number]) with normal LV size, moderate LVH, EF 65-70%, LV mid-cavity gradient reaching 63 mmHg with valsalva but minimal at rest, grade I diastolic dysfunction, cannot estimate PA systolic pressure (no TR doppler signal), RV normal. RHC (4/11): Mean RA 11, RV 37/13, PA 33/15, mean PCWP 12, CI 3.3. Echo in 11/12 showed mild LVH, EF 65-70%, no LV mid-cavity gradient was mentioned.  Echo in 4/14 showed EF 65-70%, mild LVH, grade I diastolic dysfunction, PA systolic pressure 35 mmHg, mild LV mid-cavity gradient.  Echo (5/15) with EF 60-65%, mild TR, normal RV size and systolic function. Echo (10/15) with EF 65%, moderate LVH, normal RV.  - ECHO 11/30/15 LVEF 65-70%, Grade 1 DD.  11. ETT-myoview (4/11): 5'30", stopped due to fatigue, normal EF, no evidence for scar or ischemia. 12. V/Q scan 11/12: negative for PE.  Lower extremity venous doppler US (3/14): negative for DVT.  13. PFTs (5/13): FVC 50%, FEV1 62%, ratio 106%, DLCO 69%, TLC 69%.  74. CKD  Family History:  Family History of Alcoholism/Addiction (parent)  Family History Diabetes 1st degree relative (grandparent)  Family History High cholesterol (parent, grandparent)  Family History Hypertension (parent, grandparent)  Family History Lung cancer (grandparent)  Stomach cancer (grandmother)  Celiac Sprue daughter  Mother with MI at 89, father with MI at 62, uncle with MI at 79, brother with stents in his 12s, multiple aunts with MIs and CVAs   Social History:  Never Smoked  no alcohol  married, lives with spouse and her mother  retired Network engineer Alcohol Use - no  Illicit Drug Use - no  Review of Systems  All systems reviewed and negative except as per HPI.   ROS: All systems reviewed and negative except as per HPI.   Current Outpatient  Prescriptions  Medication Sig Dispense Refill  . aspirin EC 81 MG tablet Take 1 tablet (81 mg total) by mouth daily.    . B Complex-C (B-COMPLEX WITH VITAMIN C) tablet Take 1 tablet by mouth daily.    . Blood Glucose Calibration (ONETOUCH VERIO) SOLN Use to check sugars up to 4 times per day 1 each 12  . carvedilol (COREG) 25 MG tablet TAKE 1 TABLET BY MOUTH TWICE DAILY WITH FOOD 60 tablet 5  . Cholecalciferol (VITAMIN D3) 2000 units capsule Take 1 capsule (2,000 Units total) by mouth daily. 90 capsule 3  . citalopram (CELEXA) 20 MG tablet TAKE 1 TABLET (20 MG TOTAL) BY MOUTH DAILY. 30 tablet 1  . clindamycin (CLEOCIN) 300 MG capsule Take 1 capsule (300 mg total) by mouth 3 (three) times daily. 21 capsule 0  . clotrimazole (MYCELEX) 10 MG troche Take 1 tablet (10 mg total) by mouth 5 (five) times daily. 140 tablet 2  . clotrimazole-betamethasone (LOTRISONE) cream Apply 1 application topically 2 (two) times daily. 45 g 2  . Coenzyme Q10 200 MG capsule Take  200 mg by mouth daily.    . diclofenac sodium (VOLTAREN) 1 % GEL Apply 2 g topically daily as needed (knee pain). 10 g 3  . DULoxetine (CYMBALTA) 60 MG capsule Take 1 capsule (60 mg total) by mouth daily. 90 capsule 1  . ezetimibe (ZETIA) 10 MG tablet TAKE ONE TABLET BY MOUTH DAILY 90 tablet 0  . fenofibrate 160 MG tablet TAKE 1 TABLET (160 MG TOTAL) BY MOUTH DAILY. MUST ESTABLISH WITH NEW PCP FOR ADDITIONAL REFILLS. 90 tablet 3  . gabapentin (NEURONTIN) 300 MG capsule TAKE TWO CAPSULES BY MOUTH FOUR TIMES A DAY 240 capsule 1  . glucose blood (ONETOUCH VERIO) test strip Use as instructed up to 4 times per day 200 each 12  . hydrALAZINE (APRESOLINE) 25 MG tablet TAKE 1 TABLET (25 MG TOTAL) BY MOUTH 3 (THREE) TIMES DAILY. 90 tablet 2  . HYDROcodone-acetaminophen (NORCO) 10-325 MG tablet Take 1 tablet by mouth every 6 (six) hours as needed. 120 tablet 0  . Insulin Glargine (LANTUS SOLOSTAR) 100 UNIT/ML Solostar Pen Inject 75 Units into the skin  daily at 10 pm.    . insulin NPH-regular Human (NOVOLIN 70/30) (70-30) 100 UNIT/ML injection Inject 100 Units into the skin daily with breakfast.    . LANTUS SOLOSTAR 100 UNIT/ML Solostar Pen INJECT 50 UNITS UNDER THE SKIN EVERY EVENING AT 10:00 PM 15 pen 0  . Magnesium 500 MG TABS Take 500 mg by mouth daily.    . meloxicam (MOBIC) 7.5 MG tablet Take 1 tablet (7.5 mg total) by mouth daily. 30 tablet 0  . metolazone (ZAROXOLYN) 2.5 MG tablet Take 1 tablet (2.5 mg total) by mouth 2 (two) times a week. 15 tablet 6  . omega-3 acid ethyl esters (LOVAZA) 1 g capsule Take 2 capsules (2 g total) by mouth 2 (two) times daily. 270 capsule 3  . Potassium Chloride ER 20 MEQ TBCR Take 60 mEq by mouth 2 (two) times daily. 180 tablet 3  . potassium chloride SA (K-DUR,KLOR-CON) 20 MEQ tablet Take 60 mEq by mouth 2 (two) times daily.    [name] rOPINIRole (REQUIP XL) 4 MG 24 hr tablet TAKE 1 TABLET (4 MG TOTAL) BY MOUTH AT BEDTIME. 30 tablet 0  . rOPINIRole (REQUIP) 2 MG tablet Take 1 tablet (2 mg total) by mouth 3 (three) times daily as needed. 270 tablet 3  . spironolactone (ALDACTONE) 25 MG tablet TAKE 1 TABLET BY MOUTH DAILY 30 tablet 2  . torsemide (DEMADEX) 100 MG tablet Take 1 tablet (100 mg total) by mouth 2 (two) times daily. 60 tablet 3  . vitamin B-12 (CYANOCOBALAMIN) 500 MCG tablet Take 500 mcg by mouth daily. Reported on 09/09/2015     No current facility-administered medications for this encounter.    Vitals:   02/09/16 1213  BP: (!) 149/62  Pulse: 71  SpO2: 98%   Wt Readings from Last 3 Encounters:  02/04/16 237 lb (107.5 kg)  01/04/16 246 lb 6.4 oz (111.8 kg)  12/08/15 255 lb 3.2 oz (115.8 kg)     General: NAD, obese. Husband present.  In wheelchair.    Neck: Thick, JVP difficult to assess but does not appear elevated. No thyromegaly or thyroid nodule.  Lungs: CTAB, normal effort.  CV: Nondisplaced PMI.  Heart regular S1/S2, no [id number], 2/6 systolic murmur along sternal border.  No carotid  bruit.  Abdomen: obese, soft, NT, ND, no HSM. No bruits or masses. +BS  Neurologic: Alert and oriented x 3.  Psych: Normal affect.  Extremities: No clubbing/cyanosis, bilateral severe venous stasis changes. BLE 1+ ankle edema.  Chronic venous stasis changes.  Right foot > left foot erythema.    Assessment/Plan: 1. Acute/ Chronic diastolic CHF:  NYHA class III symptoms but seems more limited by her feet right now than breathing.  She does not appear significantly volume overloaded but exam is difficult.  - Continue torsemide 100 mg BID.  - Continue metolazone 2.5 mg twice a week.   - Continue spiro 25 mg daily.  - I think she would be a good Cardiomems candidate.  Will bring her back in a month to discuss, hopefully foot pain will be controlled by then.  2. OHS:  Followed by Dr Elsworth Soho, has previously stated no role for CPAP.  - Should use home oxygen continuously   3. Obesity: Continue to watch diet.   4. HTN: BP high today but in pain.  No changes to regimen.   5. Hyperlipidemia:  High triglycerides.   - Continue current on Lovaza, Zetia, and fenofibrate.   - Lipids today.  6. Diabetic peripheral neuropathy:  Very painful, addressing with PCP (gabapentin, hydrocodone). 7. CKD: Stage III. Creatinine stable when recently checked. 8. PAD: Bilateral iliac disease on aortoiliac dopplers in 7/17.  Saw Dr Fletcher Anon, plan for medical management.  She is on ASA and has not been able to tolerate statins. 9. Murmur: Has had vigorous LV contraction with LV mid-cavity gradient in the past (is on Coreg).   10. Venous stasis dermatitis: Possibly with superimposed cellulitis. She is supposed to be on clindamycin but not sure she is taking it. - She needs to check at home to see whether she is on the clindamycin.     Followup 1 month to discuss Cardiomems.   Loralie Champagne   02/11/2016

## 2016-02-12 ENCOUNTER — Telehealth: Payer: Self-pay | Admitting: Internal Medicine

## 2016-02-12 NOTE — Telephone Encounter (Signed)
Pt daughter called in and said that pt can not take they hydrocodone with the tylenol in it.  She said that pt is in pain and needs the reg one.  She would like for someone to call her today or she thinks pt will be in the hospital all weekend   Best number [phone]

## 2016-02-12 NOTE — Telephone Encounter (Signed)
Tried to call no answer and no voice mail at number provided.

## 2016-02-16 ENCOUNTER — Other Ambulatory Visit: Payer: Self-pay | Admitting: Internal Medicine

## 2016-02-18 ENCOUNTER — Telehealth: Payer: Self-pay | Admitting: *Deleted

## 2016-02-18 NOTE — Telephone Encounter (Signed)
Pt left msg on triage stating she is needing another pain med without the tylenol in it. She didn't realize the Norco have tylenol in it. Pt states she can not take acetaminophen which is tylenol...Johny Chess

## 2016-02-18 NOTE — Telephone Encounter (Signed)
She has been on this same medication for a long time now without problems. Would not recommend change.

## 2016-02-18 NOTE — Telephone Encounter (Signed)
Called pt she states tylenol make her restless leg symptoms go crazy it makes a tickle feeling, and aches much worst...lmb

## 2016-02-18 NOTE — Telephone Encounter (Signed)
Husband walk-in requesting status on pain med. Inform husband what MD stated. Advise him to make an f/u appt w/MD to discuss. Husband told Anderson Malta that pt is not going to be able top make it through the weekend. He has started hiding guns bcz she is in pain. Inform jennifer to let him speak w/Tamara (Clinical team Leader)...Johny Chess

## 2016-02-18 NOTE — Telephone Encounter (Signed)
Why cannot she take tylenol?

## 2016-02-23 ENCOUNTER — Encounter: Payer: Self-pay | Admitting: Internal Medicine

## 2016-02-23 ENCOUNTER — Ambulatory Visit (INDEPENDENT_AMBULATORY_CARE_PROVIDER_SITE_OTHER): Payer: PPO | Admitting: Internal Medicine

## 2016-02-23 ENCOUNTER — Other Ambulatory Visit: Payer: Self-pay | Admitting: Internal Medicine

## 2016-02-23 DIAGNOSIS — G2581 Restless legs syndrome: Secondary | ICD-10-CM

## 2016-02-23 DIAGNOSIS — E1142 Type 2 diabetes mellitus with diabetic polyneuropathy: Secondary | ICD-10-CM

## 2016-02-23 MED ORDER — PREGABALIN 200 MG PO CAPS: 200.0000 mg | ORAL_CAPSULE | Freq: Two times a day (BID) | ORAL | 0 refills | Status: DC

## 2016-02-23 NOTE — Progress Notes (Signed)
Pre visit review using our clinic review tool, if applicable. No additional management support is needed unless otherwise documented below in the visit note. 

## 2016-02-23 NOTE — Progress Notes (Signed)
   Subjective:    Patient ID: Melissa Myers, female    DOB: 1945-06-13, 71 y.o.   MRN: [id number]  HPI The patient is a 71 YO female coming in for pain management visit. They had called in on Friday very upset that she was taking tylenol in her pain medicine and demanding an alternative. She has been on this medication for a long time now without changes. She has taken her pain medication prior to visit and is sleepy during visit. She is insistent that her restless leg is worse lately and her neuropathy. Her sugars are still labile and her endocrinologist is trying to get these under control however the new insulin is not covered by insurance.   Review of Systems  Constitutional: Positive for activity change and fatigue. Negative for appetite change, fever and unexpected weight change.  Respiratory: Negative.   Cardiovascular: Negative.   Gastrointestinal: Negative.   Musculoskeletal: Positive for arthralgias, gait problem and myalgias.  Neurological: Positive for weakness and numbness. Negative for dizziness, facial asymmetry and headaches.      Objective:   Physical Exam  Constitutional: She is oriented to person, place, and time. She appears well-developed and well-nourished.  Obese  HENT:  Head: Normocephalic and atraumatic.  Eyes: EOM are normal.  Cardiovascular: Normal rate and regular rhythm.   Pulmonary/Chest: Effort normal. No respiratory distress. She has no wheezes. She has no rales.  Abdominal: Soft. She exhibits no distension. There is no tenderness. There is no rebound.  Musculoskeletal: She exhibits tenderness.  Does have pain in her legs and feet chronic and worse than usual.  Neurological: She is alert and oriented to person, place, and time. Coordination abnormal.   Vitals:   02/23/16 1352  BP: (!) 122/58  Pulse: 65  Resp: 12  Temp: 97.8 F (36.6 C)  TempSrc: Oral  SpO2: 99%  Height: 4\' 10"  (1.473 m)      Assessment & Plan:

## 2016-02-23 NOTE — Assessment & Plan Note (Signed)
This is a severe concern and limiting her QOL. She is taking requip max doses as well as gabapentin max doses. She will stop gabapentin and switch to lyrica 200 mg BID to see if this can be more effective. Was on lyrica back in 2015 but not max doses and they did not have side effects but just felt it was not as helpful. We did discuss that she has been on tylenol in her pain medication for many years and this has not changed recently and should not change and they are agreeable.

## 2016-02-23 NOTE — Patient Instructions (Addendum)
We have given you the prescription for the lyrica.   When you get it start taking in the morning. Do not take the gabapentin with the lyrica.   Take 1 pill twice a day of the lyrica. Give it 3-4 days and then call us with how it is doing.

## 2016-03-10 ENCOUNTER — Telehealth (HOSPITAL_COMMUNITY): Payer: Self-pay

## 2016-03-10 ENCOUNTER — Encounter (HOSPITAL_COMMUNITY): Payer: PPO

## 2016-03-10 NOTE — Telephone Encounter (Signed)
Left VM concerning todays appointment with CHF clinic and need to cancel due to inclement weather. Advised to call clinic to reschedule at patient's convenience. Also advised to call clinic for any triage needs as answering service/on call provider still available to traige over the phone.  Renee Pain, RN

## 2016-03-11 ENCOUNTER — Encounter (HOSPITAL_COMMUNITY): Payer: PPO

## 2016-03-11 ENCOUNTER — Other Ambulatory Visit: Payer: Self-pay | Admitting: Internal Medicine

## 2016-03-15 ENCOUNTER — Inpatient Hospital Stay (HOSPITAL_COMMUNITY)
Admission: EM | Admit: 2016-03-15 | Discharge: 2016-03-21 | DRG: 445 | Disposition: A | Discharge status: Skilled Nursing Facility | Payer: PPO | Attending: Family Medicine | Admitting: Family Medicine

## 2016-03-15 ENCOUNTER — Emergency Department (HOSPITAL_COMMUNITY): Payer: PPO

## 2016-03-15 ENCOUNTER — Encounter (HOSPITAL_COMMUNITY): Payer: Self-pay

## 2016-03-15 DIAGNOSIS — R634 Abnormal weight loss: Secondary | ICD-10-CM

## 2016-03-15 DIAGNOSIS — L97919 Non-pressure chronic ulcer of unspecified part of right lower leg with unspecified severity: Secondary | ICD-10-CM | POA: Diagnosis not present

## 2016-03-15 DIAGNOSIS — K831 Obstruction of bile duct: Principal | ICD-10-CM | POA: Diagnosis present

## 2016-03-15 DIAGNOSIS — G4733 Obstructive sleep apnea (adult) (pediatric): Secondary | ICD-10-CM | POA: Diagnosis not present

## 2016-03-15 DIAGNOSIS — D649 Anemia, unspecified: Secondary | ICD-10-CM | POA: Diagnosis not present

## 2016-03-15 DIAGNOSIS — I83029 Varicose veins of left lower extremity with ulcer of unspecified site: Secondary | ICD-10-CM | POA: Diagnosis present

## 2016-03-15 DIAGNOSIS — R278 Other lack of coordination: Secondary | ICD-10-CM | POA: Diagnosis not present

## 2016-03-15 DIAGNOSIS — G4700 Insomnia, unspecified: Secondary | ICD-10-CM | POA: Diagnosis not present

## 2016-03-15 DIAGNOSIS — L97929 Non-pressure chronic ulcer of unspecified part of left lower leg with unspecified severity: Secondary | ICD-10-CM | POA: Diagnosis present

## 2016-03-15 DIAGNOSIS — E1110 Type 2 diabetes mellitus with ketoacidosis without coma: Secondary | ICD-10-CM | POA: Diagnosis not present

## 2016-03-15 DIAGNOSIS — Z79899 Other long term (current) drug therapy: Secondary | ICD-10-CM | POA: Diagnosis not present

## 2016-03-15 DIAGNOSIS — R112 Nausea with vomiting, unspecified: Secondary | ICD-10-CM | POA: Diagnosis not present

## 2016-03-15 DIAGNOSIS — R2689 Other abnormalities of gait and mobility: Secondary | ICD-10-CM | POA: Diagnosis not present

## 2016-03-15 DIAGNOSIS — E785 Hyperlipidemia, unspecified: Secondary | ICD-10-CM | POA: Diagnosis present

## 2016-03-15 DIAGNOSIS — R404 Transient alteration of awareness: Secondary | ICD-10-CM | POA: Diagnosis not present

## 2016-03-15 DIAGNOSIS — E1142 Type 2 diabetes mellitus with diabetic polyneuropathy: Secondary | ICD-10-CM | POA: Diagnosis not present

## 2016-03-15 DIAGNOSIS — E11622 Type 2 diabetes mellitus with other skin ulcer: Secondary | ICD-10-CM | POA: Diagnosis present

## 2016-03-15 DIAGNOSIS — Z7982 Long term (current) use of aspirin: Secondary | ICD-10-CM

## 2016-03-15 DIAGNOSIS — K838 Other specified diseases of biliary tract: Secondary | ICD-10-CM | POA: Diagnosis not present

## 2016-03-15 DIAGNOSIS — R739 Hyperglycemia, unspecified: Secondary | ICD-10-CM | POA: Diagnosis not present

## 2016-03-15 DIAGNOSIS — K449 Diaphragmatic hernia without obstruction or gangrene: Secondary | ICD-10-CM | POA: Diagnosis not present

## 2016-03-15 DIAGNOSIS — I110 Hypertensive heart disease with heart failure: Secondary | ICD-10-CM | POA: Diagnosis present

## 2016-03-15 DIAGNOSIS — M6281 Muscle weakness (generalized): Secondary | ICD-10-CM

## 2016-03-15 DIAGNOSIS — I5032 Chronic diastolic (congestive) heart failure: Secondary | ICD-10-CM | POA: Diagnosis not present

## 2016-03-15 DIAGNOSIS — H9192 Unspecified hearing loss, left ear: Secondary | ICD-10-CM | POA: Diagnosis present

## 2016-03-15 DIAGNOSIS — G2581 Restless legs syndrome: Secondary | ICD-10-CM | POA: Diagnosis present

## 2016-03-15 DIAGNOSIS — E1151 Type 2 diabetes mellitus with diabetic peripheral angiopathy without gangrene: Secondary | ICD-10-CM | POA: Diagnosis present

## 2016-03-15 DIAGNOSIS — R945 Abnormal results of liver function studies: Secondary | ICD-10-CM | POA: Diagnosis not present

## 2016-03-15 DIAGNOSIS — G9340 Encephalopathy, unspecified: Secondary | ICD-10-CM | POA: Diagnosis not present

## 2016-03-15 DIAGNOSIS — Z6841 Body Mass Index (BMI) 40.0 and over, adult: Secondary | ICD-10-CM | POA: Diagnosis not present

## 2016-03-15 DIAGNOSIS — E1165 Type 2 diabetes mellitus with hyperglycemia: Secondary | ICD-10-CM | POA: Diagnosis not present

## 2016-03-15 DIAGNOSIS — Z9981 Dependence on supplemental oxygen: Secondary | ICD-10-CM | POA: Diagnosis not present

## 2016-03-15 DIAGNOSIS — K869 Disease of pancreas, unspecified: Secondary | ICD-10-CM | POA: Diagnosis present

## 2016-03-15 DIAGNOSIS — R935 Abnormal findings on diagnostic imaging of other abdominal regions, including retroperitoneum: Secondary | ICD-10-CM | POA: Diagnosis not present

## 2016-03-15 DIAGNOSIS — E876 Hypokalemia: Secondary | ICD-10-CM | POA: Diagnosis present

## 2016-03-15 DIAGNOSIS — F322 Major depressive disorder, single episode, severe without psychotic features: Secondary | ICD-10-CM | POA: Diagnosis not present

## 2016-03-15 DIAGNOSIS — M1990 Unspecified osteoarthritis, unspecified site: Secondary | ICD-10-CM | POA: Diagnosis not present

## 2016-03-15 DIAGNOSIS — R011 Cardiac murmur, unspecified: Secondary | ICD-10-CM | POA: Diagnosis not present

## 2016-03-15 DIAGNOSIS — G894 Chronic pain syndrome: Secondary | ICD-10-CM | POA: Diagnosis not present

## 2016-03-15 DIAGNOSIS — R7401 Elevation of levels of liver transaminase levels: Secondary | ICD-10-CM

## 2016-03-15 DIAGNOSIS — K8689 Other specified diseases of pancreas: Secondary | ICD-10-CM | POA: Diagnosis present

## 2016-03-15 DIAGNOSIS — F329 Major depressive disorder, single episode, unspecified: Secondary | ICD-10-CM | POA: Diagnosis present

## 2016-03-15 DIAGNOSIS — R740 Nonspecific elevation of levels of transaminase and lactic acid dehydrogenase [LDH]: Secondary | ICD-10-CM

## 2016-03-15 DIAGNOSIS — IMO0001 Reserved for inherently not codable concepts without codable children: Secondary | ICD-10-CM | POA: Diagnosis present

## 2016-03-15 DIAGNOSIS — I10 Essential (primary) hypertension: Secondary | ICD-10-CM | POA: Diagnosis not present

## 2016-03-15 DIAGNOSIS — Z466 Encounter for fitting and adjustment of urinary device: Secondary | ICD-10-CM | POA: Diagnosis not present

## 2016-03-15 DIAGNOSIS — K625 Hemorrhage of anus and rectum: Secondary | ICD-10-CM | POA: Diagnosis not present

## 2016-03-15 DIAGNOSIS — E860 Dehydration: Secondary | ICD-10-CM | POA: Diagnosis present

## 2016-03-15 DIAGNOSIS — Z794 Long term (current) use of insulin: Secondary | ICD-10-CM

## 2016-03-15 DIAGNOSIS — R17 Unspecified jaundice: Secondary | ICD-10-CM | POA: Diagnosis present

## 2016-03-15 DIAGNOSIS — K219 Gastro-esophageal reflux disease without esophagitis: Secondary | ICD-10-CM | POA: Diagnosis present

## 2016-03-15 DIAGNOSIS — I83019 Varicose veins of right lower extremity with ulcer of unspecified site: Secondary | ICD-10-CM | POA: Diagnosis not present

## 2016-03-15 DIAGNOSIS — E119 Type 2 diabetes mellitus without complications: Secondary | ICD-10-CM

## 2016-03-15 LAB — URINALYSIS, ROUTINE W REFLEX MICROSCOPIC
BACTERIA UA: NONE SEEN
Bilirubin Urine: NEGATIVE
Glucose, UA: >=500 mg/dL — AB
Hgb urine dipstick: NEGATIVE
KETONES UR: NEGATIVE mg/dL
LEUKOCYTES UA: NEGATIVE
Nitrite: NEGATIVE
PROTEIN: NEGATIVE mg/dL
Specific Gravity, Urine: 1.01 (ref 1.005–1.030)
pH: 6 (ref 5.0–8.0)

## 2016-03-15 LAB — CBC WITH DIFFERENTIAL/PLATELET
BASOS PCT: 0 %
Basophils Absolute: 0 10*3/uL (ref 0.0–0.1)
EOS ABS: 0.1 10*3/uL (ref 0.0–0.7)
Eosinophils Relative: 2 %
HCT: 31 % — ABNORMAL LOW (ref 36.0–46.0)
HEMOGLOBIN: 10.3 g/dL — AB (ref 12.0–15.0)
Lymphocytes Relative: 38 %
Lymphs Abs: 2.4 10*3/uL (ref 0.7–4.0)
MCH: 27 pg (ref 26.0–34.0)
MCHC: 33.2 g/dL (ref 30.0–36.0)
MCV: 81.4 fL (ref 78.0–100.0)
MONO ABS: 0.4 10*3/uL (ref 0.1–1.0)
Monocytes Relative: 7 %
Neutro Abs: 3.4 10*3/uL (ref 1.7–7.7)
Neutrophils Relative %: 53 %
Platelets: 216 10*3/uL (ref 150–400)
RBC: 3.81 MIL/uL — ABNORMAL LOW (ref 3.87–5.11)
RDW: 17.5 % — AB (ref 11.5–15.5)
WBC: 6.3 10*3/uL (ref 4.0–10.5)

## 2016-03-15 LAB — PROTIME-INR
INR: 0.94
Prothrombin Time: 12.6 seconds (ref 11.4–15.2)

## 2016-03-15 LAB — COMPREHENSIVE METABOLIC PANEL
ALBUMIN: 2.8 g/dL — AB (ref 3.5–5.0)
ALK PHOS: 428 U/L — AB (ref 38–126)
ALT: 259 U/L — ABNORMAL HIGH (ref 14–54)
ANION GAP: 12 (ref 5–15)
AST: 307 U/L — ABNORMAL HIGH (ref 15–41)
BUN: 49 mg/dL — ABNORMAL HIGH (ref 6–20)
CHLORIDE: 81 mmol/L — AB (ref 101–111)
CO2: 28 mmol/L (ref 22–32)
Calcium: 8.8 mg/dL — ABNORMAL LOW (ref 8.9–10.3)
Creatinine, Ser: 1.09 mg/dL — ABNORMAL HIGH (ref 0.44–1.00)
GFR calc Af Amer: 58 mL/min — ABNORMAL LOW (ref >60)
GFR calc non Af Amer: 50 mL/min — ABNORMAL LOW (ref >60)
GLUCOSE: 877 mg/dL — AB (ref 65–99)
POTASSIUM: 3.1 mmol/L — AB (ref 3.5–5.1)
SODIUM: 121 mmol/L — AB (ref 135–145)
Total Bilirubin: 11.6 mg/dL — ABNORMAL HIGH (ref 0.3–1.2)
Total Protein: 6.8 g/dL (ref 6.5–8.1)

## 2016-03-15 LAB — ACETAMINOPHEN LEVEL: Acetaminophen (Tylenol), Serum: <10 ug/mL — ABNORMAL LOW (ref 10–30)

## 2016-03-15 LAB — GLUCOSE, CAPILLARY
GLUCOSE-CAPILLARY: 260 mg/dL — AB (ref 65–99)
GLUCOSE-CAPILLARY: 255 mg/dL — AB (ref 65–99)
Glucose-Capillary: 228 mg/dL — ABNORMAL HIGH (ref 65–99)

## 2016-03-15 LAB — CBG MONITORING, ED
Glucose-Capillary: >600 mg/dL (ref 65–99)
Glucose-Capillary: 481 mg/dL — ABNORMAL HIGH (ref 65–99)

## 2016-03-15 LAB — I-STAT CG4 LACTIC ACID, ED: Lactic Acid, Venous: 1.42 mmol/L (ref 0.5–1.9)

## 2016-03-15 LAB — LIPASE, BLOOD: LIPASE: 29 U/L (ref 11–51)

## 2016-03-15 LAB — AMMONIA: AMMONIA: 49 umol/L — AB (ref 9–35)

## 2016-03-15 LAB — APTT: aPTT: 30 seconds (ref 24–36)

## 2016-03-15 MED ORDER — HYDROCODONE-ACETAMINOPHEN 10-325 MG PO TABS
1.0000 | ORAL_TABLET | Freq: Four times a day (QID) | ORAL | Status: DC | PRN
  Administered 2016-03-15 – 2016-03-20 (×6): 1 via ORAL
  Filled 2016-03-15 (×6): qty 1

## 2016-03-15 MED ORDER — INSULIN GLARGINE 100 UNIT/ML ~~LOC~~ SOLN
30.0000 [IU] | Freq: Every day | SUBCUTANEOUS | Status: DC
  Administered 2016-03-15: 30 [IU] via SUBCUTANEOUS
  Filled 2016-03-15: qty 0.3

## 2016-03-15 MED ORDER — SODIUM CHLORIDE 0.9 % IV BOLUS (SEPSIS)
1000.0000 mL | Freq: Once | INTRAVENOUS | Status: AC
  Administered 2016-03-15: 1000 mL via INTRAVENOUS

## 2016-03-15 MED ORDER — ACETAMINOPHEN 650 MG RE SUPP: 650.0000 mg | Freq: Four times a day (QID) | RECTAL | Status: DC | PRN

## 2016-03-15 MED ORDER — DULOXETINE HCL 60 MG PO CPEP
60.0000 mg | ORAL_CAPSULE | Freq: Every day | ORAL | Status: DC
  Administered 2016-03-15 – 2016-03-21 (×6): 60 mg via ORAL
  Filled 2016-03-15 (×4): qty 1
  Filled 2016-03-15: qty 2
  Filled 2016-03-15 (×2): qty 1

## 2016-03-15 MED ORDER — POTASSIUM CHLORIDE 2 MEQ/ML IV SOLN
Freq: Once | INTRAVENOUS | Status: AC
  Administered 2016-03-15: 15:00:00 via INTRAVENOUS
  Filled 2016-03-15: qty 1000

## 2016-03-15 MED ORDER — ONDANSETRON HCL 4 MG PO TABS: 4.0000 mg | ORAL_TABLET | Freq: Four times a day (QID) | ORAL | Status: DC | PRN

## 2016-03-15 MED ORDER — ACETAMINOPHEN 325 MG PO TABS: 650.0000 mg | ORAL_TABLET | Freq: Four times a day (QID) | ORAL | Status: DC | PRN

## 2016-03-15 MED ORDER — CYANOCOBALAMIN 500 MCG PO TABS
500.0000 ug | ORAL_TABLET | Freq: Every day | ORAL | Status: DC
  Administered 2016-03-15 – 2016-03-21 (×6): 500 ug via ORAL
  Filled 2016-03-15 (×7): qty 1

## 2016-03-15 MED ORDER — B COMPLEX-C PO TABS
1.0000 | ORAL_TABLET | Freq: Every day | ORAL | Status: DC
  Administered 2016-03-15 – 2016-03-21 (×7): 1 via ORAL
  Filled 2016-03-15 (×7): qty 1

## 2016-03-15 MED ORDER — CARVEDILOL 25 MG PO TABS
25.0000 mg | ORAL_TABLET | Freq: Two times a day (BID) | ORAL | Status: DC
  Administered 2016-03-15 – 2016-03-21 (×12): 25 mg via ORAL
  Filled 2016-03-15 (×11): qty 1

## 2016-03-15 MED ORDER — ONDANSETRON HCL 4 MG/2ML IJ SOLN: 4.0000 mg | Freq: Four times a day (QID) | INTRAMUSCULAR | Status: DC | PRN

## 2016-03-15 MED ORDER — VITAMIN D 1000 UNITS PO TABS
2000.0000 [IU] | ORAL_TABLET | Freq: Every day | ORAL | Status: DC
  Administered 2016-03-15 – 2016-03-21 (×6): 2000 [IU] via ORAL
  Filled 2016-03-15 (×6): qty 2

## 2016-03-15 MED ORDER — INSULIN DETEMIR 100 UNIT/ML ~~LOC~~ SOLN: 30.0000 [IU] | Freq: Every day | SUBCUTANEOUS | Status: DC

## 2016-03-15 MED ORDER — HEPARIN SODIUM (PORCINE) 5000 UNIT/ML IJ SOLN
5000.0000 [IU] | Freq: Three times a day (TID) | INTRAMUSCULAR | Status: DC
  Administered 2016-03-15 – 2016-03-21 (×14): 5000 [IU] via SUBCUTANEOUS
  Filled 2016-03-15 (×15): qty 1

## 2016-03-15 MED ORDER — INSULIN ASPART 100 UNIT/ML ~~LOC~~ SOLN
0.0000 [IU] | SUBCUTANEOUS | Status: DC
  Administered 2016-03-15 (×2): 11 [IU] via SUBCUTANEOUS
  Administered 2016-03-15: 7 [IU] via SUBCUTANEOUS
  Administered 2016-03-17 (×2): 4 [IU] via SUBCUTANEOUS
  Administered 2016-03-17: 7 [IU] via SUBCUTANEOUS
  Administered 2016-03-17: 2 [IU] via SUBCUTANEOUS
  Administered 2016-03-17 – 2016-03-18 (×2): 4 [IU] via SUBCUTANEOUS
  Administered 2016-03-18: 3 [IU] via SUBCUTANEOUS
  Administered 2016-03-18: 15 [IU] via SUBCUTANEOUS
  Administered 2016-03-18: 4 [IU] via SUBCUTANEOUS
  Administered 2016-03-19 (×2): 7 [IU] via SUBCUTANEOUS
  Administered 2016-03-19: 11 [IU] via SUBCUTANEOUS
  Administered 2016-03-19: 15 [IU] via SUBCUTANEOUS
  Administered 2016-03-19 (×2): 11 [IU] via SUBCUTANEOUS
  Administered 2016-03-20: 7 [IU] via SUBCUTANEOUS
  Administered 2016-03-20: 4 [IU] via SUBCUTANEOUS
  Administered 2016-03-20: 3 [IU] via SUBCUTANEOUS
  Administered 2016-03-20: 7 [IU] via SUBCUTANEOUS
  Administered 2016-03-20 – 2016-03-21 (×4): 4 [IU] via SUBCUTANEOUS
  Administered 2016-03-21: 3 [IU] via SUBCUTANEOUS

## 2016-03-15 MED ORDER — SODIUM CHLORIDE 0.9 % IV SOLN
INTRAVENOUS | Status: DC
Start: 2016-03-15 — End: 2016-03-21
  Administered 2016-03-15: 1000 mL via INTRAVENOUS
  Administered 2016-03-16 – 2016-03-19 (×7): via INTRAVENOUS
  Administered 2016-03-20: 1000 mL via INTRAVENOUS
  Administered 2016-03-21: 02:00:00 via INTRAVENOUS

## 2016-03-15 MED ORDER — FENOFIBRATE 160 MG PO TABS
160.0000 mg | ORAL_TABLET | Freq: Every day | ORAL | Status: DC
  Administered 2016-03-15 – 2016-03-21 (×6): 160 mg via ORAL
  Filled 2016-03-15 (×7): qty 1

## 2016-03-15 MED ORDER — ASPIRIN EC 81 MG PO TBEC
81.0000 mg | DELAYED_RELEASE_TABLET | Freq: Every day | ORAL | Status: DC
  Administered 2016-03-15 – 2016-03-21 (×6): 81 mg via ORAL
  Filled 2016-03-15 (×6): qty 1

## 2016-03-15 MED ORDER — CITALOPRAM HYDROBROMIDE 20 MG PO TABS
20.0000 mg | ORAL_TABLET | Freq: Every day | ORAL | Status: DC
  Administered 2016-03-15 – 2016-03-21 (×6): 20 mg via ORAL
  Filled 2016-03-15 (×6): qty 1

## 2016-03-15 MED ORDER — EZETIMIBE 10 MG PO TABS
10.0000 mg | ORAL_TABLET | Freq: Every day | ORAL | Status: DC
  Administered 2016-03-15 – 2016-03-21 (×6): 10 mg via ORAL
  Filled 2016-03-15 (×6): qty 1

## 2016-03-15 MED ORDER — INSULIN ASPART 100 UNIT/ML ~~LOC~~ SOLN
10.0000 [IU] | Freq: Once | SUBCUTANEOUS | Status: AC
  Administered 2016-03-15: 10 [IU] via INTRAVENOUS
  Filled 2016-03-15: qty 1

## 2016-03-15 MED ORDER — COENZYME Q10 200 MG PO CAPS: 200.0000 mg | ORAL_CAPSULE | Freq: Every day | ORAL | Status: DC

## 2016-03-15 MED ORDER — INSULIN GLARGINE 100 UNIT/ML ~~LOC~~ SOLN
30.0000 [IU] | Freq: Every day | SUBCUTANEOUS | Status: DC
  Filled 2016-03-15 (×2): qty 0.3

## 2016-03-15 MED ORDER — ROPINIROLE HCL ER 4 MG PO TB24
4.0000 mg | ORAL_TABLET | Freq: Every day | ORAL | Status: DC
  Administered 2016-03-15 – 2016-03-20 (×6): 4 mg via ORAL
  Filled 2016-03-15 (×6): qty 1

## 2016-03-15 MED ORDER — MELOXICAM 7.5 MG PO TABS
7.5000 mg | ORAL_TABLET | Freq: Every day | ORAL | Status: DC
  Administered 2016-03-15 – 2016-03-21 (×6): 7.5 mg via ORAL
  Filled 2016-03-15 (×8): qty 1

## 2016-03-15 MED ORDER — PREGABALIN 100 MG PO CAPS
200.0000 mg | ORAL_CAPSULE | Freq: Two times a day (BID) | ORAL | Status: DC
  Administered 2016-03-15 – 2016-03-21 (×11): 200 mg via ORAL
  Filled 2016-03-15 (×11): qty 2

## 2016-03-15 MED ORDER — IOPAMIDOL (ISOVUE-300) INJECTION 61%
INTRAVENOUS | Status: AC
  Filled 2016-03-15: qty 100

## 2016-03-15 MED ORDER — HYDRALAZINE HCL 25 MG PO TABS
25.0000 mg | ORAL_TABLET | Freq: Three times a day (TID) | ORAL | Status: DC
  Administered 2016-03-15 – 2016-03-21 (×18): 25 mg via ORAL
  Filled 2016-03-15 (×16): qty 1

## 2016-03-15 MED ORDER — IOPAMIDOL (ISOVUE-300) INJECTION 61%
100.0000 mL | Freq: Once | INTRAVENOUS | Status: AC | PRN
  Administered 2016-03-15: 100 mL via INTRAVENOUS

## 2016-03-15 NOTE — H&P (Signed)
History and Physical    Melissa Myers [id number] DOB: 25-Mar-1945 DOA: 03/15/2016  PCP: [name], MD   Patient coming from: Home  Chief Complaint: Nausea and vomiting.   HPI: Melissa Myers is a 71 y.o. female with medical history significant of uncontrolled diabetes, who presents to the hospital with the chief complain of nausea and vomiting for the last 48 hours. Her nausea and vomiting has been persistent, with no improving or worsening factors, multiple times per day, associated with jaundice and diarrhea, but no fever or chills, no abdominal pain. Positive generalized pruritus. Patient had cholecystectomy in the past.  ED Course: Patient was found hyperbilirubinemic with abnormal abdominal CT suggesting obstructive physiology. Referred for admission and evaluation.   Review of Systems:  1. General. No fevers no chills. No weight gain or weight loss 2. ENT. No runny nose or sore throat 3. Pulmonary. No shortness of breath, cough or hemoptysis 4. Cardiovascular. No chest pain, no claudication or angina. 5. Gastrointestinal positive for nausea and vomiting as mentioned in history present illness, no abdominal pain. Positive for diarrhea. 6. Hematology. No easy bruisability or frequent infections 7. Endocrine. No tremors, heat or cold intolerance 8. Dermatology positive for scratch marks due to pruritus 9. Neurology positive for paresthesias in the lower extremities 10. Urology. No dysuria or increased urinary frequency, positive polyuria.   Past Medical History:  Diagnosis Date  . Anemia   . Cellulitis    LOWER EXTREMITIES  . Chronic diarrhea    a/w nausea - felt related to IBS  . Deaf    left side only  . Diastolic CHF ([place])   . Disc degeneration, lumbar   . GERD (gastroesophageal reflux disease)   . Hyperlipidemia    hx rhabdo on statins  . Hypertension   . Neuropathy (HCC)    feet, toes and fingers  . On home oxygen therapy    uses oxygen 2  liters min per [employer] at night and prn during day  . OSA (obstructive sleep apnea)    05/2009 sleep study - refuses CPAP  . Osteoarthritis   . RLS (restless legs syndrome)   . Shortness of breath    chronic  . Stasis dermatitis   . Type II or unspecified type diabetes mellitus without mention of complication, not stated as uncontrolled    insulin dep    Past Surgical History:  Procedure Laterality Date  . CHOLECYSTECTOMY  1997  . COLONOSCOPY N/A 12/03/2012   Procedure: COLONOSCOPY;  Surgeon: Lafayette Dragon, MD;  Location: [facility];  Service: Endoscopy;  Laterality: N/A;  . TONSILLECTOMY  1970  . TUBAL LIGATION  1980  . UMBILICAL HERNIA REPAIR  1995  . uterine polyp removal  2008     reports that she has never smoked. She has never used smokeless tobacco. She reports that she does not drink alcohol or use drugs.  Allergies  Allergen Reactions  . Cymbalta [Duloxetine Hcl] Other (See Comments)    Restless leg syndrome  . Statins Other (See Comments)    Statin drugs cause muscle pain / "muscle damage"--was told by MD not to take  . Sulfa Antibiotics Diarrhea  . Levemir [Insulin Detemir] Itching  . Morphine Other (See Comments)    GI upset and headaches  . Zinc Swelling and Rash    Family History  Problem Relation Age of Onset  . Heart disease Mother   . Heart attack Mother 67  . Heart disease Father   .  Heart attack Father 66  . Heart disease      family history  . Stomach cancer Paternal Grandmother   . Lung cancer Paternal Grandfather   . CVA      several aunts  . Heart attack      several aunts and an uncle  . Colon cancer Neg Hx    Unacceptable: Noncontributory, unremarkable, or negative. Acceptable: Family history reviewed and not pertinent (If you reviewed it)  Prior to Admission medications   Medication Sig Start Date End Date Taking? Authorizing Provider  aspirin EC 81 MG tablet Take 1 tablet (81 mg total) by mouth daily. 07/08/14   Rowe Clack, MD    B Complex-C (B-COMPLEX WITH VITAMIN C) tablet Take 1 tablet by mouth daily.    Historical Provider, MD  Blood Glucose Calibration (ONETOUCH VERIO) SOLN Use to check sugars up to 4 times per day 10/23/15   [name], MD  carvedilol (COREG) 25 MG tablet TAKE 1 TABLET BY MOUTH TWICE DAILY WITH FOOD 01/20/16   Jolaine Artist, MD  Cholecalciferol (VITAMIN D3) 2000 units capsule Take 1 capsule (2,000 Units total) by mouth daily. 06/17/15   Janith Lima, MD  citalopram (CELEXA) 20 MG tablet TAKE 1 TABLET (20 MG TOTAL) BY MOUTH DAILY. 01/27/16   [name], MD  clindamycin (CLEOCIN) 300 MG capsule Take 1 capsule (300 mg total) by mouth 3 (three) times daily. 02/04/16   [name], MD  clotrimazole (MYCELEX) 10 MG troche Take 1 tablet (10 mg total) by mouth 5 (five) times daily. 06/17/15   Janith Lima, MD  clotrimazole-betamethasone (LOTRISONE) cream Apply 1 application topically 2 (two) times daily. 11/04/15   [name], MD  Coenzyme Q10 200 MG capsule Take 200 mg by mouth daily.    Historical Provider, MD  diclofenac sodium (VOLTAREN) 1 % GEL Apply 2 g topically daily as needed (knee pain). 10/26/14   Florencia Reasons, MD  DULoxetine (CYMBALTA) 60 MG capsule Take 1 capsule (60 mg total) by mouth daily. 09/25/15   [name], MD  ezetimibe (ZETIA) 10 MG tablet TAKE ONE TABLET BY MOUTH DAILY 02/03/16   [name], MD  fenofibrate 160 MG tablet TAKE 1 TABLET (160 MG TOTAL) BY MOUTH DAILY. MUST ESTABLISH WITH NEW PCP FOR ADDITIONAL REFILLS. 02/25/15   [name], MD  gabapentin (NEURONTIN) 300 MG capsule TAKE TWO CAPSULES BY MOUTH FOUR TIMES A DAY 01/19/16   [name], MD  glucose blood (ONETOUCH VERIO) test strip Use as instructed up to 4 times per day 10/23/15   [name], MD  hydrALAZINE (APRESOLINE) 25 MG tablet TAKE 1 TABLET (25 MG TOTAL) BY MOUTH 3 (THREE) TIMES DAILY. 02/16/16   Jolaine Artist, MD   HYDROcodone-acetaminophen (NORCO) 10-325 MG tablet Take 1 tablet by mouth every 6 (six) hours as needed. 02/04/16   [name], MD  Insulin Glargine (LANTUS SOLOSTAR) 100 UNIT/ML Solostar Pen Inject 75 Units into the skin daily at 10 pm.    Historical Provider, MD  insulin NPH-regular Human (NOVOLIN 70/30) (70-30) 100 UNIT/ML injection Inject 100 Units into the skin daily with breakfast.    Historical Provider, MD  LANTUS SOLOSTAR 100 UNIT/ML Solostar Pen INJECT 50 UNITS UNDER THE SKIN EVERY EVENING AT 10:00 PM 12/28/15   [name], MD  Magnesium 500 MG TABS Take 500 mg by mouth daily.    Historical Provider, MD  meloxicam (MOBIC) 7.5 MG  tablet TAKE ONE TABLET BY MOUTH DAILY 03/11/16   [name], MD  metolazone (ZAROXOLYN) 2.5 MG tablet Take 1 tablet (2.5 mg total) by mouth 2 (two) times a week. 12/17/15   Rhiana Friar, PA-C  omega-3 acid ethyl esters (LOVAZA) 1 g capsule Take 2 capsules (2 g total) by mouth 2 (two) times daily. 07/06/15   Larey Dresser, MD  Potassium Chloride ER 20 MEQ TBCR Take 60 mEq by mouth 2 (two) times daily. 01/20/16   Megumi Friar, PA-C  potassium chloride SA (K-DUR,KLOR-CON) 20 MEQ tablet Take 60 mEq by mouth 2 (two) times daily.    Historical Provider, MD  pregabalin (LYRICA) 200 MG capsule Take 1 capsule (200 mg total) by mouth 2 (two) times daily. 02/23/16   [name], MD  rOPINIRole (REQUIP XL) 4 MG 24 hr tablet TAKE 1 TABLET (4 MG TOTAL) BY MOUTH AT BEDTIME. 02/23/16   [name], MD  rOPINIRole (REQUIP) 2 MG tablet Take 1 tablet (2 mg total) by mouth 3 (three) times daily as needed. 05/04/15   [name], MD  spironolactone (ALDACTONE) 25 MG tablet TAKE 1 TABLET BY MOUTH DAILY 09/21/15   Jolaine Artist, MD  torsemide (DEMADEX) 100 MG tablet Take 1 tablet (100 mg total) by mouth 2 (two) times daily. 11/30/15   Amy D Clegg, NP  vitamin B-12 (CYANOCOBALAMIN) 500 MCG tablet Take 500 mcg by  mouth daily. Reported on 09/09/2015    Historical Provider, MD    Physical Exam: Vitals:   03/15/16 1045 03/15/16 1047 03/15/16 1200 03/15/16 1402  BP: (!) 119/54  135/55 144/72  Pulse: (!) 59  (!) 58 72  Resp: 18  14 18   Temp: 98.4 F (36.9 C)   98.2 F (36.8 C)  TempSrc: Oral   Oral  SpO2: 93%  92% 100%  Weight:  105.7 kg (233 lb)    Height:  4\' 10"  (1.473 m)        Constitutional: deconditioned, not in pain or dyspnea.  Vitals:   03/15/16 1045 03/15/16 1047 03/15/16 1200 03/15/16 1402  BP: (!) 119/54  135/55 144/72  Pulse: (!) 59  (!) 58 72  Resp: 18  14 18   Temp: 98.4 F (36.9 C)   98.2 F (36.8 C)  TempSrc: Oral   Oral  SpO2: 93%  92% 100%  Weight:  105.7 kg (233 lb)    Height:  4\' 10"  (1.473 m)     Eyes: PERRL, lids and conjunctivae mild pale with positive icterus. Head normocephalic, nose and ears with no deformities.  ENMT: Mucous membranes are dry. Posterior pharynx clear of any exudate or lesions.Normal dentition.  Neck: normal, supple, no masses, no thyromegaly Respiratory: clear to auscultation bilaterally, no wheezing, no crackles. Normal respiratory effort. No accessory muscle use.  Cardiovascular: Regular rate and rhythm, no murmurs / rubs / gallops. No extremity edema. 2+ pedal pulses. No carotid bruits.  Abdomen: protuberant with no tenderness, no masses palpated. No hepatosplenomegaly. Bowel sounds positive.  Musculoskeletal: no clubbing / cyanosis. No joint deformity upper and lower extremities. Good ROM, no contractures. Normal muscle tone.  Skin: no rashes, lesions, ulcers. No induration. Positive scratch marks on her abdomen. Feeding erythema, discoloration, nontender. Symmetric. Well-defined margins.  Neurologic: CN 2-12 grossly intact. Sensation intact, DTR normal. Strength 5/5 in all 4.   Labs on Admission: I have personally reviewed following labs and imaging studies  CBC:  Recent Labs Lab 03/15/16 1116  WBC 6.3  NEUTROABS 3.4  HGB 10.3*    HCT 31.0*  MCV 81.4  PLT 123XX123   Basic Metabolic Panel:  Recent Labs Lab 03/15/16 1116  NA 121*  K 3.1*  CL 81*  CO2 28  GLUCOSE 877*  BUN 49*  CREATININE 1.09*  CALCIUM 8.8*   GFR: Estimated Creatinine Clearance: 50.6 mL/min (by C-G formula based on SCr of 1.09 mg/dL (H)). Liver Function Tests:  Recent Labs Lab 03/15/16 1116  AST 307*  ALT 259*  ALKPHOS 428*  BILITOT 11.6*  PROT 6.8  ALBUMIN 2.8*   No results for input(s): LIPASE, AMYLASE in the last 168 hours.  Recent Labs Lab 03/15/16 1116  AMMONIA 49*   Coagulation Profile:  Recent Labs Lab 03/15/16 1116  INR 0.94   Cardiac Enzymes: No results for input(s): CKTOTAL, CKMB, CKMBINDEX, TROPONINI in the last 168 hours. BNP (last 3 results)  Recent Labs  05/04/15 1623 11/04/15 0956  PROBNP 25.0 20.0   HbA1C: No results for input(s): HGBA1C in the last 72 hours. CBG:  Recent Labs Lab 03/15/16 1100 03/15/16 1400  GLUCAP >600* 481*   Lipid Profile: No results for input(s): CHOL, HDL, LDLCALC, TRIG, CHOLHDL, LDLDIRECT in the last 72 hours. Thyroid Function Tests: No results for input(s): TSH, T4TOTAL, FREET4, T3FREE, THYROIDAB in the last 72 hours. Anemia Panel: No results for input(s): VITAMINB12, FOLATE, FERRITIN, TIBC, IRON, RETICCTPCT in the last 72 hours. Urine analysis:    Component Value Date/Time   COLORURINE YELLOW 03/15/2016 1059   APPEARANCEUR CLEAR 03/15/2016 1059   LABSPEC 1.010 03/15/2016 1059   PHURINE 6.0 03/15/2016 1059   GLUCOSEU >=500 (A) 03/15/2016 1059   GLUCOSEU NEGATIVE 11/04/2015 0956   HGBUR NEGATIVE 03/15/2016 1059   BILIRUBINUR NEGATIVE 03/15/2016 1059   BILIRUBINUR neg 08/17/2015 1547   KETONESUR NEGATIVE 03/15/2016 1059   PROTEINUR NEGATIVE 03/15/2016 1059   UROBILINOGEN 0.2 11/04/2015 0956   NITRITE NEGATIVE 03/15/2016 1059   LEUKOCYTESUR NEGATIVE 03/15/2016 1059   Sepsis Labs:  !!!!!!!!!!!!!!!!!!!!!!!!!!!!!!!!!!!!!!!!!!!! @LABRCNTIP (procalcitonin:4,lacticidven:4) )No results found for this or any previous visit (from the past 240 hour(s)).   Radiological Exams on Admission: Ct Abdomen Pelvis W Contrast  Result Date: 03/15/2016 CLINICAL DATA:  Nausea and generalized weakness. Jaundice. Diabetes. Gastroesophageal reflux disease. EXAM: CT ABDOMEN AND PELVIS WITH CONTRAST TECHNIQUE: Multidetector CT imaging of the abdomen and pelvis was performed using the standard protocol following bolus administration of intravenous contrast. CONTRAST:  137mL ISOVUE-300 IOPAMIDOL (ISOVUE-300) INJECTION 61% COMPARISON:  07/12/2012 abdominal ultrasound. Abdominopelvic CT of 01/13/2008. FINDINGS: Lower chest: Lucencies of the lung bases are likely due to centrilobular emphysema. Mild cardiomegaly, without pericardial pleural effusion. Tiny hiatal hernia. Hepatobiliary: No focal liver lesion. Caudate lobe enlargement, without specific evidence of cirrhosis. Cholecystectomy. Intrahepatic ducts borderline dilated. The common duct measures upper normal, 10 mm in the porta hepatis on image 30/series 2. Tapers gradually in the region of the pancreatic head. No obstructive mass identified. No calcified stone. Pancreas: Pancreatic atrophy is moderate. The pancreatic duct is moderately dilated at 9 mm in the head on image 29/series 2. Suspect pancreas divisum, with the dorsal duct entering the duodenum on image 32/series 2. No evidence of acute pancreatitis. Spleen: Subcentimeter low-density splenic lesions are of doubtful clinical significance. Adrenals/Urinary Tract: Normal adrenal glands. Too small to characterize lesions in both kidneys. No hydronephrosis. Normal urinary bladder. Stomach/Bowel: Normal remainder of the stomach. Normal colon and terminal ileum. Normal small bowel. Vascular/Lymphatic: Aortic and branch vessel atherosclerosis. No abdominopelvic adenopathy. Reproductive: Hysterectomy.  No adnexal  mass.  Other: No significant free fluid. Mild pelvic floor laxity. Bilateral fat containing inguinal hernias. Musculoskeletal: Mild osteopenia. Remote seventh lateral right rib fracture. Lumbosacral spondylosis. IMPRESSION: 1. Cholecystectomy with borderline biliary duct dilatation. 2. Pancreatic atrophy with moderate pancreatic duct dilatation, followed to the level of the duodenum. 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 Considerations include otherwise occult stricture, periampullary lesion, or main duct intraductal papillary mucinous neoplasm. Given the combination of borderline biliary duct dilatation, moderate pancreatic duct dilatation, and the clinical history of jaundice, consider further evaluation with ERCP. If the patient is not a good ERCP candidate, MRCP with and without contrast would be another option. 3.  No acute process in the abdomen or pelvis. 4.  Tiny hiatal hernia. 5.  Aortic atherosclerosis. Electronically Signed   By: Abigail Miyamoto M.D.   On: 03/15/2016 13:53    EKG: Independently reviewed. NA  Assessment/Plan Active Problems:   Jaundice   Hyperbilirubinemia   This is a 71 year old female with significant history for uncontrolled diabetes mellitus presents with 2 days of nausea, vomiting, diarrhea and jaundice. Her symptoms have been persistent. On initial physical examination temperature 98.2, heart rate 72, blood pressure 144/72, respiratory rate 18, oxygen saturation 100% on room air. Her conjunctiva is mildly pale, positive icterus. Oral mucosa dry, lungs clear to auscultation, heart S1-S2 present rhythmic, abdomen protuberant, nontender, no organomegaly, no extremity is with trace edema, erythema the feet with discoloration. Sodium 121, potassium 3.1, chloride 81, bicarbonate 28, glucose 877, BUN 49, creatinine 1.09, lipase 29, AST 307, ALT 259, alkaline phosphatase 128, ammonia 49, total bilirubin's 11.6, white count 6.3, 1110.3, hematocrit 31.0, platelets 116, INR 0.94 urine  with greater than 500, white cells 0-5. Negative proteins. CT scan with pancreatic atrophy with moderate pancreatic duct dilatation (9 mm).   Working diagnosis. Suspected the conjugated hyperbilirubinemia due to possible pancreatic duct obstruction.  1. Hyperbilirubinemia. Will admit patient to the medical floor, will continue supportive therapy with IV fluids and antiemetics. Her ammonia is mildly elevated, no signs of significant liver failure, no stigmata of end-stage liver disease. Will recheck ammonia in the morning, if worsening may need lactulose therapy. Further workup with MRCP, gastrology will beconsulted for possible ERCP as well.  2. Uncontrolled hyperglycemia. Question about patient's compliance with her insulin regimen. Will place patient on long-acting insulin with Levemir and sliding scale, further adjustment depending on response to insulin. Patient at home with almost 200 units of insulin daily.   3. Hypertension. Will continue Coreg and hydralazine, will hold diuretic therapy for now, gentle hydration with normal saline.   4. Neuropathy. Continue Lyrica hold on gabapentin.   5. Dyslipidemia. Continue fenofibrate and Zetia  6. Depression. Continue citalopram, Cymbalta, no agitation or confusion.  7. Vascular venous insufficiency.  Noted erythema on bilateral feet, no tenderness, positive discoloration, will continue close follow-up on skin changes, hold on antibiotics for now.   DVT prophylaxis: heparin.  Code Status: Full  Family Communication: No family at the bedside Disposition Plan: Home  Consults called: Gastroenterology Admission status: Inpatient    Jaylani Mcguinn Gerome Apley MD [facility] Pager [phone]  If 7PM-7AM, please contact night-coverage [email or web address] Password [email or web address]  03/15/2016, 2:57 PM

## 2016-03-15 NOTE — ED Notes (Signed)
Bed: [id number] Expected date:  Expected time:  Means of arrival:  Comments: EMS-weakness/jaundice

## 2016-03-15 NOTE — ED Notes (Signed)
Patient transported to CT 

## 2016-03-15 NOTE — Progress Notes (Signed)
Tried to call ED for report, unable to get nurse.Call was transferred to another department instead.

## 2016-03-15 NOTE — ED Provider Notes (Signed)
[facility] DEPT Provider Note   CSN: [id number] Arrival date & time: 03/15/16  1029     History   Chief Complaint Chief Complaint  Patient presents with  . Weakness  . Hyperglycemia  . Jaundice    HPI Melissa Myers is a 71 y.o. female.  The history is provided by the patient.  Weakness  Primary symptoms comment: fatigue, mild confusion. This is a new problem. The problem has been gradually worsening. There was no focality noted. There has been no fever. Associated symptoms comments: Abdominal pain, chronic diarrhea. There were no medications administered prior to arrival. Associated medical issues comments: DM.    Past Medical History:  Diagnosis Date  . Anemia   . Cellulitis    LOWER EXTREMITIES  . Chronic diarrhea    a/w nausea - felt related to IBS  . Deaf    left side only  . Diastolic CHF ([place])   . Disc degeneration, lumbar   . GERD (gastroesophageal reflux disease)   . Hyperlipidemia    hx rhabdo on statins  . Hypertension   . Neuropathy (HCC)    feet, toes and fingers  . On home oxygen therapy    uses oxygen 2 liters min per [place] at night and prn during day  . OSA (obstructive sleep apnea)    05/2009 sleep study - refuses CPAP  . Osteoarthritis   . RLS (restless legs syndrome)   . Shortness of breath    chronic  . Stasis dermatitis   . Type II or unspecified type diabetes mellitus without mention of complication, not stated as uncontrolled    insulin dep    Patient Active Problem List   Diagnosis Date Noted  . Diabetic peripheral neuropathy ([place]) 02/23/2016  . History of fall 10/22/2015  . Bradycardia 10/09/2015  . Left foot pain 07/10/2015  . Murmur, cardiac 07/02/2015  . Chronic pain syndrome 05/05/2015  . MDD (major depressive disorder), single episode, severe ([place]) 05/05/2015  . Chronic anemia 04/14/2015  . DM (diabetes mellitus), type 2 with renal complications ([place]) [id number]  . Rectal bleeding 03/13/2015  . Hyperlipidemia  12/23/2014  . Deficiency anemia 07/26/2012  . Venous stasis ulcers of both lower extremities ([place]) 01/04/2011  . INSOMNIA 08/03/2009  . Chronic diastolic heart failure ([place]) 06/01/2009  . Vitamin D deficiency 05/12/2009  . Sleep apnea 04/29/2009  . Dyslipidemia 04/20/2009  . Morbid obesity ([place]) 04/20/2009  . RESTLESS LEGS SYNDROME 04/20/2009  . Peripheral neuropathy ([place]) 04/20/2009  . Essential hypertension 04/20/2009  . Low back pain 04/20/2009    Past Surgical History:  Procedure Laterality Date  . CHOLECYSTECTOMY  1997  . COLONOSCOPY N/A 12/03/2012   Procedure: COLONOSCOPY;  Surgeon: Lafayette Dragon, MD;  Location: [facility] ENDOSCOPY;  Service: Endoscopy;  Laterality: N/A;  . TONSILLECTOMY  1970  . TUBAL LIGATION  1980  . UMBILICAL HERNIA REPAIR  1995  . uterine polyp removal  2008    OB History    No data available       Home Medications    Prior to Admission medications   Medication Sig Start Date End Date Taking? Authorizing Provider  aspirin EC 81 MG tablet Take 1 tablet (81 mg total) by mouth daily. 07/08/14   Rowe Clack, MD  B Complex-C (B-COMPLEX WITH VITAMIN C) tablet Take 1 tablet by mouth daily.    Historical Provider, MD  Blood Glucose Calibration (ONETOUCH VERIO) SOLN Use to check sugars up to 4 times per day 10/23/15  [name], MD  carvedilol (COREG) 25 MG tablet TAKE 1 TABLET BY MOUTH TWICE DAILY WITH FOOD 01/20/16   Jolaine Artist, MD  Cholecalciferol (VITAMIN D3) 2000 units capsule Take 1 capsule (2,000 Units total) by mouth daily. 06/17/15   Janith Lima, MD  citalopram (CELEXA) 20 MG tablet TAKE 1 TABLET (20 MG TOTAL) BY MOUTH DAILY. 01/27/16   [name], MD  clindamycin (CLEOCIN) 300 MG capsule Take 1 capsule (300 mg total) by mouth 3 (three) times daily. 02/04/16   [name], MD  clotrimazole (MYCELEX) 10 MG troche Take 1 tablet (10 mg total) by mouth 5 (five) times daily. 06/17/15   Janith Lima, MD    clotrimazole-betamethasone (LOTRISONE) cream Apply 1 application topically 2 (two) times daily. 11/04/15   [name], MD  Coenzyme Q10 200 MG capsule Take 200 mg by mouth daily.    Historical Provider, MD  diclofenac sodium (VOLTAREN) 1 % GEL Apply 2 g topically daily as needed (knee pain). 10/26/14   Florencia Reasons, MD  DULoxetine (CYMBALTA) 60 MG capsule Take 1 capsule (60 mg total) by mouth daily. 09/25/15   [name], MD  ezetimibe (ZETIA) 10 MG tablet TAKE ONE TABLET BY MOUTH DAILY 02/03/16   [name], MD  fenofibrate 160 MG tablet TAKE 1 TABLET (160 MG TOTAL) BY MOUTH DAILY. MUST ESTABLISH WITH NEW PCP FOR ADDITIONAL REFILLS. 02/25/15   [name], MD  gabapentin (NEURONTIN) 300 MG capsule TAKE TWO CAPSULES BY MOUTH FOUR TIMES A DAY 01/19/16   [name], MD  glucose blood (ONETOUCH VERIO) test strip Use as instructed up to 4 times per day 10/23/15   [name], MD  hydrALAZINE (APRESOLINE) 25 MG tablet TAKE 1 TABLET (25 MG TOTAL) BY MOUTH 3 (THREE) TIMES DAILY. 02/16/16   Jolaine Artist, MD  HYDROcodone-acetaminophen (NORCO) 10-325 MG tablet Take 1 tablet by mouth every 6 (six) hours as needed. 02/04/16   [name], MD  Insulin Glargine (LANTUS SOLOSTAR) 100 UNIT/ML Solostar Pen Inject 75 Units into the skin daily at 10 pm.    Historical Provider, MD  insulin NPH-regular Human (NOVOLIN 70/30) (70-30) 100 UNIT/ML injection Inject 100 Units into the skin daily with breakfast.    Historical Provider, MD  LANTUS SOLOSTAR 100 UNIT/ML Solostar Pen INJECT 50 UNITS UNDER THE SKIN EVERY EVENING AT 10:00 PM 12/28/15   [name], MD  Magnesium 500 MG TABS Take 500 mg by mouth daily.    Historical Provider, MD  meloxicam (MOBIC) 7.5 MG tablet TAKE ONE TABLET BY MOUTH DAILY 03/11/16   [name], MD  metolazone (ZAROXOLYN) 2.5 MG tablet Take 1 tablet (2.5 mg total) by mouth 2 (two) times a week. 12/17/15   Melvinia Friar, PA-C  omega-3 acid ethyl esters (LOVAZA) 1 g capsule Take 2 capsules (2 g total) by mouth 2 (two) times daily. 07/06/15   Larey Dresser, MD  Potassium Chloride ER 20 MEQ TBCR Take 60 mEq by mouth 2 (two) times daily. 01/20/16   Wilsie Friar, PA-C  potassium chloride SA (K-DUR,KLOR-CON) 20 MEQ tablet Take 60 mEq by mouth 2 (two) times daily.    Historical Provider, MD  pregabalin (LYRICA) 200 MG capsule Take 1 capsule (200 mg total) by mouth 2 (two) times daily. 02/23/16   [name], MD  rOPINIRole (REQUIP XL) 4 MG 24 hr tablet TAKE 1 TABLET (4 MG TOTAL) BY MOUTH  AT BEDTIME. 02/23/16   [name], MD  rOPINIRole (REQUIP) 2 MG tablet Take 1 tablet (2 mg total) by mouth 3 (three) times daily as needed. 05/04/15   [name], MD  spironolactone (ALDACTONE) 25 MG tablet TAKE 1 TABLET BY MOUTH DAILY 09/21/15   Jolaine Artist, MD  torsemide (DEMADEX) 100 MG tablet Take 1 tablet (100 mg total) by mouth 2 (two) times daily. 11/30/15   Amy D Clegg, NP  vitamin B-12 (CYANOCOBALAMIN) 500 MCG tablet Take 500 mcg by mouth daily. Reported on 09/09/2015    Historical Provider, MD    Family History Family History  Problem Relation Age of Onset  . Heart disease Mother   . Heart attack Mother 23  . Heart disease Father   . Heart attack Father 50  . Heart disease      family history  . Stomach cancer Paternal Grandmother   . Lung cancer Paternal Grandfather   . CVA      several aunts  . Heart attack      several aunts and an uncle  . Colon cancer Neg Hx     Social History Social History  Substance Use Topics  . Smoking status: Never Smoker  . Smokeless tobacco: Never Used  . Alcohol use No     Allergies   Cymbalta [duloxetine hcl]; Statins; Sulfa antibiotics; Levemir [insulin detemir]; Morphine; and Zinc   Review of Systems Review of Systems  Neurological: Positive for weakness.  All other systems reviewed and are negative.    Physical  Exam Updated Vital Signs BP (!) 119/54 (BP Location: Left Arm)   Pulse (!) 59   Temp 98.4 F (36.9 C) (Oral)   Resp 18   Ht 4\' 10"  (1.473 m)   Wt 233 lb (105.7 kg)   SpO2 93%   BMI 48.70 kg/m   Physical Exam  Constitutional: She is oriented to person, place, and time. She appears well-developed and well-nourished. No distress.  HENT:  Head: Normocephalic.  Nose: Nose normal.  Eyes: Conjunctivae are normal.  Neck: Neck supple. No tracheal deviation present.  Cardiovascular: Normal rate, regular rhythm and normal heart sounds.   Pulmonary/Chest: Effort normal and breath sounds normal. No respiratory distress.  Abdominal: Soft. She exhibits distension. There is tenderness (diffuse). There is no rigidity, no rebound and no guarding.  Neurological: She is alert and oriented to person, place, and time.  Skin: Skin is warm and dry. Capillary refill takes less than 2 seconds.  Jaundice noted especially above waistline and in face  Psychiatric: Her affect is blunt. She is slowed.     ED Treatments / Results  Labs (all labs ordered are listed, but only abnormal results are displayed) Labs Reviewed  CBC WITH DIFFERENTIAL/PLATELET - Abnormal; Notable for the following:       Result Value   RBC 3.81 (*)    Hemoglobin 10.3 (*)    HCT 31.0 (*)    RDW 17.5 (*)    All other components within normal limits  COMPREHENSIVE METABOLIC PANEL - Abnormal; Notable for the following:    Sodium 121 (*)    Potassium 3.1 (*)    Chloride 81 (*)    Glucose, Bld 877 (*)    BUN 49 (*)    Creatinine, Ser 1.09 (*)    Calcium 8.8 (*)    Albumin 2.8 (*)    AST 307 (*)    ALT 259 (*)    Alkaline Phosphatase 428 (*)  Total Bilirubin 11.6 (*)    GFR calc non Af Amer 50 (*)    GFR calc Af Amer 58 (*)    All other components within normal limits  URINALYSIS, ROUTINE W REFLEX MICROSCOPIC - Abnormal; Notable for the following:    Glucose, UA >=500 (*)    Squamous Epithelial / LPF 0-5 (*)    All  other components within normal limits  AMMONIA - Abnormal; Notable for the following:    Ammonia 49 (*)    All other components within normal limits  ACETAMINOPHEN LEVEL - Abnormal; Notable for the following:    Acetaminophen (Tylenol), Serum <10 (*)    All other components within normal limits  GLUCOSE, CAPILLARY - Abnormal; Notable for the following:    Glucose-Capillary 260 (*)    All other components within normal limits  GLUCOSE, CAPILLARY - Abnormal; Notable for the following:    Glucose-Capillary 255 (*)    All other components within normal limits  CBG MONITORING, ED - Abnormal; Notable for the following:    Glucose-Capillary >600 (*)    All other components within normal limits  CBG MONITORING, ED - Abnormal; Notable for the following:    Glucose-Capillary 481 (*)    All other components within normal limits  PROTIME-INR  APTT  LIPASE, BLOOD  COMPREHENSIVE METABOLIC PANEL  CBC  I-STAT CG4 LACTIC ACID, ED    EKG  EKG Interpretation None       Radiology Ct Abdomen Pelvis W Contrast  Result Date: 03/15/2016 CLINICAL DATA:  Nausea and generalized weakness. Jaundice. Diabetes. Gastroesophageal reflux disease. EXAM: CT ABDOMEN AND PELVIS WITH CONTRAST TECHNIQUE: Multidetector CT imaging of the abdomen and pelvis was performed using the standard protocol following bolus administration of intravenous contrast. CONTRAST:  156mL ISOVUE-300 IOPAMIDOL (ISOVUE-300) INJECTION 61% COMPARISON:  07/12/2012 abdominal ultrasound. Abdominopelvic CT of 01/13/2008. FINDINGS: Lower chest: Lucencies of the lung bases are likely due to centrilobular emphysema. Mild cardiomegaly, without pericardial pleural effusion. Tiny hiatal hernia. Hepatobiliary: No focal liver lesion. Caudate lobe enlargement, without specific evidence of cirrhosis. Cholecystectomy. Intrahepatic ducts borderline dilated. The common duct measures upper normal, 10 mm in the porta hepatis on image 30/series 2. Tapers gradually  in the region of the pancreatic head. No obstructive mass identified. No calcified stone. Pancreas: Pancreatic atrophy is moderate. The pancreatic duct is moderately dilated at 9 mm in the head on image 29/series 2. Suspect pancreas divisum, with the dorsal duct entering the duodenum on image 32/series 2. No evidence of acute pancreatitis. Spleen: Subcentimeter low-density splenic lesions are of doubtful clinical significance. Adrenals/Urinary Tract: Normal adrenal glands. Too small to characterize lesions in both kidneys. No hydronephrosis. Normal urinary bladder. Stomach/Bowel: Normal remainder of the stomach. Normal colon and terminal ileum. Normal small bowel. Vascular/Lymphatic: Aortic and branch vessel atherosclerosis. No abdominopelvic adenopathy. Reproductive: Hysterectomy.  No adnexal mass. Other: No significant free fluid. Mild pelvic floor laxity. Bilateral fat containing inguinal hernias. Musculoskeletal: Mild osteopenia. Remote seventh lateral right rib fracture. Lumbosacral spondylosis. IMPRESSION: 1. Cholecystectomy with borderline biliary duct dilatation. 2. Pancreatic atrophy with moderate pancreatic duct dilatation, followed to the level of the duodenum. 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 Considerations include otherwise occult stricture, periampullary lesion, or main duct intraductal papillary mucinous neoplasm. Given the combination of borderline biliary duct dilatation, moderate pancreatic duct dilatation, and the clinical history of jaundice, consider further evaluation with ERCP. If the patient is not a good ERCP candidate, MRCP with and without contrast would be another option. 3.  No acute process  in the abdomen or pelvis. 4.  Tiny hiatal hernia. 5.  Aortic atherosclerosis. Electronically Signed   By: Abigail Miyamoto M.D.   On: 03/15/2016 13:53    Procedures Procedures (including critical care time)  Emergency Focused Ultrasound Exam Limited Abdominal Ultrasound for Evaluation of Free  Fluid  Performed and interpreted by Dr. Laneta Simmers Indication: abdominal pain Multiple views of the abdomen are obtained with a multi frequency probe.  Findings: Free fluid is not present.  Interpretation: no evidence of free fluid consistent with ascites CPT Code: [id number]  Medications Ordered in ED Medications  iopamidol (ISOVUE-300) 61 % injection (not administered)  meloxicam (MOBIC) tablet 7.5 mg (7.5 mg Oral Given 03/15/16 1842)  rOPINIRole (REQUIP XL) 24 hr tablet 4 mg (4 mg Oral Given 03/15/16 1844)  pregabalin (LYRICA) capsule 200 mg (not administered)  hydrALAZINE (APRESOLINE) tablet 25 mg (25 mg Oral Given 03/15/16 1841)  HYDROcodone-acetaminophen (NORCO) 10-325 MG per tablet 1 tablet (1 tablet Oral Given 03/15/16 1817)  ezetimibe (ZETIA) tablet 10 mg (10 mg Oral Given 03/15/16 1835)  citalopram (CELEXA) tablet 20 mg (20 mg Oral Given 03/15/16 1835)  carvedilol (COREG) tablet 25 mg (25 mg Oral Given 03/15/16 1817)  DULoxetine (CYMBALTA) DR capsule 60 mg (60 mg Oral Given 03/15/16 1835)  cholecalciferol (VITAMIN D) tablet 2,000 Units (2,000 Units Oral Given 03/15/16 1834)  fenofibrate tablet 160 mg (160 mg Oral Given 03/15/16 1835)  cyanocobalamin tablet 500 mcg (500 mcg Oral Given 03/15/16 1835)  B-complex with vitamin C tablet 1 tablet (1 tablet Oral Given 03/15/16 1835)  aspirin EC tablet 81 mg (81 mg Oral Given 03/15/16 1835)  heparin injection 5,000 Units (5,000 Units Subcutaneous Given 03/15/16 1841)  0.9 %  sodium chloride infusion ( Intravenous Rate/Dose Change 03/15/16 1744)  acetaminophen (TYLENOL) tablet 650 mg (not administered)    Or  acetaminophen (TYLENOL) suppository 650 mg (not administered)  ondansetron (ZOFRAN) tablet 4 mg (not administered)    Or  ondansetron (ZOFRAN) injection 4 mg (not administered)  insulin aspart (novoLOG) injection 0-20 Units (11 Units Subcutaneous Given 03/15/16 2032)  insulin glargine (LANTUS) injection 30 Units (0 Units Subcutaneous Duplicate [id number]  [id number])  sodium chloride 0.9 % bolus 1,000 mL (0 mLs Intravenous Stopped 03/15/16 1644)  sodium chloride 0.9 % bolus 1,000 mL (0 mLs Intravenous Stopped 03/15/16 1439)  insulin aspart (novoLOG) injection 10 Units (10 Units Intravenous Given 03/15/16 1305)  sodium chloride 0.9 % 1,000 mL with potassium chloride 80 mEq infusion ( Intravenous Given 03/15/16 1443)  iopamidol (ISOVUE-300) 61 % injection 100 mL (100 mLs Intravenous Contrast Given 03/15/16 1313)     Initial Impression / Assessment and Plan / ED Course  I have reviewed the triage vital signs and the nursing notes.  Pertinent labs & imaging results that were available during my care of the patient were reviewed by me and considered in my medical decision making (see chart for details).     71 y.o. female presents with jaundice, weakness, mild confusion and stated abdominal pain. She does have diffuse tenderness over abdomen without peritonitis. Bilirubin high as expected with clinical appearance. S/p remote cholecystectomy. CT performed to evaluate for obstructive biliary pathology.   Hyperglycemia is evident and started correction with IV insulin and fluid resuscitation, supplementary K for hypokalemia.  Question of ampullary mass or stricture. Will need ERCP vs MRCP for further characterization. Pt informed of differential considerations. Hospitalist was consulted for admission and will see the patient in the emergency department.   Final Clinical Impressions(s) /  ED Diagnoses   Final diagnoses:  Jaundice  Conjugated hyperbilirubinemia  Transaminitis  Encephalopathy acute  Hyperglycemia without ketosis  Hypokalemia    New Prescriptions Current Discharge Medication List       Leo Grosser, MD 03/15/16 2129

## 2016-03-15 NOTE — Consult Note (Signed)
Referring Provider: [facility]  Primary Care Physician:  [name], MD Primary Gastroenterologist:  Previously Delfin Edis, MD  Reason for Consultation:   jaundice  ASSESSMENT AND PLAN:  84. 71 yo female with markedly abnormal LFTs / jaundice, pruritis and mild weight. Remote cholecystectomy.  -No pancreatic lesion on CT scan. PD moderately dilated, CBD mildly dilated in porta hepatis.   Choledocholithiasis consideration but scenario concerning for other possibilities including ampullary lesion. Of course biliary stricture on DDx list as well given hx of 12 mm sphincterotomy done in 2009 (see report at bottom of note).  -Will obtain MRCP, am labs.  -She can eat from GI standpoint but NPO after MN in case procedure needed.   2. Uncontrolled DM with severe peripheral neuropathy.  Recent A1c 14. Glucose is in 800s in ED  Normal anion gap. She has associated pseudohyponatremia. Appears dehydrated on exam. Management per Hospitalist.  HPI: HANIFA STILLSON is a 71 y.o. female with multiple medical problems at listed below. She came to ED because husband told her she was jaundiced. She hasn't had any abdominal pain. Her appetite is poor and she has lost about 8 pounds recently She admits to generalized itching. She recently started glucophage (she thinks) but doesn't know of any other medication changes. I read office telephone encounters and patient called upset that tylenol had been added to her pain medication. Paitent has been weak but no chest pain. No unusual SOB.    Past Medical History:  Diagnosis Date  . Anemia   . Cellulitis    LOWER EXTREMITIES  . Chronic diarrhea    a/w nausea - felt related to IBS  . Deaf    left side only  . Diastolic CHF ([place])   . Disc degeneration, lumbar   . GERD (gastroesophageal reflux disease)   . Hyperlipidemia    hx rhabdo on statins  . Hypertension   . Neuropathy (HCC)    feet, toes and fingers  . On home oxygen therapy    uses oxygen 2 liters min per [place] at night and prn during day  . OSA (obstructive sleep apnea)    05/2009 sleep study - refuses CPAP  . Osteoarthritis   . RLS (restless legs syndrome)   . Shortness of breath    chronic  . Stasis dermatitis   . Type II or unspecified type diabetes mellitus without mention of complication, not stated as uncontrolled    insulin dep    Past Surgical History:  Procedure Laterality Date  . CHOLECYSTECTOMY  1997  . COLONOSCOPY N/A 12/03/2012   Procedure: COLONOSCOPY;  Surgeon: Lafayette Dragon, MD;  Location: [facility];  Service: Endoscopy;  Laterality: N/A;  . TONSILLECTOMY  1970  . TUBAL LIGATION  1980  . UMBILICAL HERNIA REPAIR  1995  . uterine polyp removal  2008    Prior to Admission medications   Medication Sig Start Date End Date Taking? Authorizing Provider  aspirin EC 81 MG tablet Take 1 tablet (81 mg total) by mouth daily. 07/08/14  Yes Rowe Clack, MD  B Complex-C (B-COMPLEX WITH VITAMIN C) tablet Take 1 tablet by mouth daily.   Yes Historical Provider, MD  Blood Glucose Calibration (ONETOUCH VERIO) SOLN Use to check sugars up to 4 times per day 10/23/15  Yes [name], MD  carvedilol (COREG) 25 MG tablet TAKE 1 TABLET BY MOUTH TWICE DAILY WITH FOOD 01/20/16  Yes Jolaine Artist, MD  Cholecalciferol (VITAMIN D3) 2000 units  capsule Take 1 capsule (2,000 Units total) by mouth daily. 06/17/15  Yes Janith Lima, MD  citalopram (CELEXA) 20 MG tablet TAKE 1 TABLET (20 MG TOTAL) BY MOUTH DAILY. 01/27/16  Yes [name], MD  Coenzyme Q10 200 MG capsule Take 200 mg by mouth daily.   Yes Historical Provider, MD  DULoxetine (CYMBALTA) 60 MG capsule Take 1 capsule (60 mg total) by mouth daily. 09/25/15  Yes [name], MD  ezetimibe (ZETIA) 10 MG tablet TAKE ONE TABLET BY MOUTH DAILY 02/03/16  Yes [name], MD  fenofibrate 160 MG tablet TAKE 1 TABLET (160 MG TOTAL) BY MOUTH DAILY. MUST ESTABLISH WITH NEW PCP FOR  ADDITIONAL REFILLS. 02/25/15  Yes [name], MD  gabapentin (NEURONTIN) 300 MG capsule TAKE TWO CAPSULES BY MOUTH FOUR TIMES A DAY 01/19/16  Yes [name], MD  glucose blood (ONETOUCH VERIO) test strip Use as instructed up to 4 times per day 10/23/15  Yes [name], MD  hydrALAZINE (APRESOLINE) 25 MG tablet TAKE 1 TABLET (25 MG TOTAL) BY MOUTH 3 (THREE) TIMES DAILY. 02/16/16  Yes Jolaine Artist, MD  HYDROcodone-acetaminophen (NORCO) 10-325 MG tablet Take 1 tablet by mouth every 6 (six) hours as needed. 02/04/16  Yes [name], MD  insulin aspart (NOVOLOG FLEXPEN) 100 UNIT/ML FlexPen Inject 16 Units into the skin 3 (three) times daily with meals.    Yes Historical Provider, MD  LANTUS SOLOSTAR 100 UNIT/ML Solostar Pen INJECT 50 UNITS UNDER THE SKIN EVERY EVENING AT 10:00 PM Patient taking differently: 170 units in the morning 12/28/15  Yes [name], MD  Magnesium 500 MG TABS Take 500 mg by mouth daily.   Yes Historical Provider, MD  meclizine (ANTIVERT) 25 MG tablet Take 25 mg by mouth 3 (three) times daily as needed for dizziness.   Yes Historical Provider, MD  meloxicam (MOBIC) 7.5 MG tablet TAKE ONE TABLET BY MOUTH DAILY 03/11/16  Yes [name], MD  metFORMIN (GLUCOPHAGE-XR) 500 MG 24 hr tablet Take 1,000 mg by mouth daily with breakfast.    Yes Historical Provider, MD  metolazone (ZAROXOLYN) 2.5 MG tablet Take 1 tablet (2.5 mg total) by mouth 2 (two) times a week. 12/17/15  Yes Zaleigh Friar, PA-C  omega-3 acid ethyl esters (LOVAZA) 1 g capsule Take 2 capsules (2 g total) by mouth 2 (two) times daily. 07/06/15  Yes Larey Dresser, MD  Potassium Chloride ER 20 MEQ TBCR Take 60 mEq by mouth 2 (two) times daily. 01/20/16  Yes Aubriana Friar, PA-C  pregabalin (LYRICA) 200 MG capsule Take 1 capsule (200 mg total) by mouth 2 (two) times daily. 02/23/16  Yes [name], MD  rOPINIRole (REQUIP XL) 4 MG 24 hr tablet  TAKE 1 TABLET (4 MG TOTAL) BY MOUTH AT BEDTIME. 02/23/16  Yes [name], MD  spironolactone (ALDACTONE) 25 MG tablet TAKE 1 TABLET BY MOUTH DAILY 09/21/15  Yes Jolaine Artist, MD  torsemide (DEMADEX) 100 MG tablet Take 1 tablet (100 mg total) by mouth 2 (two) times daily. Patient taking differently: Take 100 mg by mouth daily.  11/30/15  Yes Amy D Clegg, NP  vitamin B-12 (CYANOCOBALAMIN) 500 MCG tablet Take 500 mcg by mouth daily. Reported on 09/09/2015   Yes Historical Provider, MD    Current Facility-Administered Medications  Medication Dose Route Frequency Provider Last Rate Last Dose  . insulin glargine (LANTUS) injection 30 Units  30 Units Subcutaneous Daily  Mauricio Gerome Apley, MD      . iopamidol (ISOVUE-300) 61 % injection           . sodium chloride 0.9 % 1,000 mL with potassium chloride 80 mEq infusion   Intravenous Once Leo Grosser, MD       Current Outpatient Prescriptions  Medication Sig Dispense Refill  . aspirin EC 81 MG tablet Take 1 tablet (81 mg total) by mouth daily.    . B Complex-C (B-COMPLEX WITH VITAMIN C) tablet Take 1 tablet by mouth daily.    . Blood Glucose Calibration (ONETOUCH VERIO) SOLN Use to check sugars up to 4 times per day 1 each 12  . carvedilol (COREG) 25 MG tablet TAKE 1 TABLET BY MOUTH TWICE DAILY WITH FOOD 60 tablet 5  . Cholecalciferol (VITAMIN D3) 2000 units capsule Take 1 capsule (2,000 Units total) by mouth daily. 90 capsule 3  . citalopram (CELEXA) 20 MG tablet TAKE 1 TABLET (20 MG TOTAL) BY MOUTH DAILY. 30 tablet 1  . Coenzyme Q10 200 MG capsule Take 200 mg by mouth daily.    . DULoxetine (CYMBALTA) 60 MG capsule Take 1 capsule (60 mg total) by mouth daily. 90 capsule 1  . ezetimibe (ZETIA) 10 MG tablet TAKE ONE TABLET BY MOUTH DAILY 90 tablet 0  . fenofibrate 160 MG tablet TAKE 1 TABLET (160 MG TOTAL) BY MOUTH DAILY. MUST ESTABLISH WITH NEW PCP FOR ADDITIONAL REFILLS. 90 tablet 3  . gabapentin (NEURONTIN) 300 MG capsule TAKE TWO  CAPSULES BY MOUTH FOUR TIMES A DAY 240 capsule 1  . glucose blood (ONETOUCH VERIO) test strip Use as instructed up to 4 times per day 200 each 12  . hydrALAZINE (APRESOLINE) 25 MG tablet TAKE 1 TABLET (25 MG TOTAL) BY MOUTH 3 (THREE) TIMES DAILY. 90 tablet 1  . HYDROcodone-acetaminophen (NORCO) 10-325 MG tablet Take 1 tablet by mouth every 6 (six) hours as needed. 120 tablet 0  . insulin aspart (NOVOLOG FLEXPEN) 100 UNIT/ML FlexPen Inject 16 Units into the skin 3 (three) times daily with meals.     [name] LANTUS SOLOSTAR 100 UNIT/ML Solostar Pen INJECT 50 UNITS UNDER THE SKIN EVERY EVENING AT 10:00 PM (Patient taking differently: 170 units in the morning) 15 pen 0  . Magnesium 500 MG TABS Take 500 mg by mouth daily.    . meclizine (ANTIVERT) 25 MG tablet Take 25 mg by mouth 3 (three) times daily as needed for dizziness.    . meloxicam (MOBIC) 7.5 MG tablet TAKE ONE TABLET BY MOUTH DAILY 30 tablet 0  . metFORMIN (GLUCOPHAGE-XR) 500 MG 24 hr tablet Take 1,000 mg by mouth daily with breakfast.     . metolazone (ZAROXOLYN) 2.5 MG tablet Take 1 tablet (2.5 mg total) by mouth 2 (two) times a week. 15 tablet 6  . omega-3 acid ethyl esters (LOVAZA) 1 g capsule Take 2 capsules (2 g total) by mouth 2 (two) times daily. 270 capsule 3  . Potassium Chloride ER 20 MEQ TBCR Take 60 mEq by mouth 2 (two) times daily. 180 tablet 3  . pregabalin (LYRICA) 200 MG capsule Take 1 capsule (200 mg total) by mouth 2 (two) times daily. 60 capsule 0  . rOPINIRole (REQUIP XL) 4 MG 24 hr tablet TAKE 1 TABLET (4 MG TOTAL) BY MOUTH AT BEDTIME. 30 tablet 0  . spironolactone (ALDACTONE) 25 MG tablet TAKE 1 TABLET BY MOUTH DAILY 30 tablet 2  . torsemide (DEMADEX) 100 MG tablet Take 1 tablet (100 mg total)  by mouth 2 (two) times daily. (Patient taking differently: Take 100 mg by mouth daily. ) 60 tablet 3  . vitamin B-12 (CYANOCOBALAMIN) 500 MCG tablet Take 500 mcg by mouth daily. Reported on 09/09/2015      Allergies as of 03/15/2016 -  Review Complete 03/15/2016  Allergen Reaction Noted  . Cymbalta [duloxetine hcl] Other (See Comments) 10/22/2014  . Statins Other (See Comments) 09/10/2013  . Sulfa antibiotics Diarrhea 12/31/2010  . Levemir [insulin detemir] Itching 07/24/2012  . Morphine Other (See Comments)   . Zinc Swelling and Rash     Family History  Problem Relation Age of Onset  . Heart disease Mother   . Heart attack Mother 77  . Heart disease Father   . Heart attack Father 28  . Heart disease      family history  . Stomach cancer Paternal Grandmother   . Lung cancer Paternal Grandfather   . CVA      several aunts  . Heart attack      several aunts and an uncle  . Colon cancer Neg Hx     Social History   Social History  . Marital status: Married    Spouse name: N/A  . Number of children: N/A  . Years of education: N/A   Occupational History  . Not on file.   Social History Main Topics  . Smoking status: Never Smoker  . Smokeless tobacco: Never Used  . Alcohol use No  . Drug use: No  . Sexual activity: No   Other Topics Concern  . Not on file   Social History Narrative   Lives with spouse and mother. Retired Network engineer, now housewife    Review of Systems: + polyuria.  All other systems reviewed and negative except where noted in HPI.  Physical Exam: Vital signs in last 24 hours: Temp:  [98.2 F (36.8 C)-98.4 F (36.9 C)] 98.2 F (36.8 C) (01/23 1402) Pulse Rate:  [51-72] 51 (01/23 1630) Resp:  [14-20] 14 (01/23 1630) BP: (104-144)/(54-72) 104/63 (01/23 1630) SpO2:  [92 %-100 %] 96 % (01/23 1630) Weight:  [233 lb (105.7 kg)] 233 lb (105.7 kg) (01/23 1047)   General:   Alert, obese white female in NAD.  Head:  Normocephalic and atraumatic. Eyes:  Sclera clear, no icterus.   Conjunctiva pink. Ears:  Normal auditory acuity. Nose:  No deformity, discharge,  or lesions. Mouth: Tongue and lips are parched   Neck:  Supple; no masses or thyromegaly. Lungs:  Clear throughout to  auscultation.   No wheezes, crackles, or rhonchi.  Heart:  Regular rate and rhythm Abdomen:  Soft, obese, nontender, BS active, no palp mass  Rectal:  Deferred  Msk:  Symmetrical without gross deformities. . Pulses:  Normal pulses noted. Extremities:  1+ BLE. Both distal extremities with skin changes and superimposed erythema .  Neurologic:  Alert and  oriented x4;  grossly normal neurologically.No asterixis Skin:  Intact without significant lesions or rashes.. Psych:  Alert and cooperative. Pleasant.  Intake/Output from previous day: No intake/output data recorded. Intake/Output this shift: Total I/O In: 2000 [IV Piggyback:2000] Out: -   Lab Results:  Recent Labs  03/15/16 1116  WBC 6.3  HGB 10.3*  HCT 31.0*  PLT 216   BMET  Recent Labs  03/15/16 1116  NA 121*  K 3.1*  CL 81*  CO2 28  GLUCOSE 877*  BUN 49*  CREATININE 1.09*  CALCIUM 8.8*   LFT  Recent Labs  03/15/16 1116  PROT 6.8  ALBUMIN 2.8*  AST 307*  ALT 259*  ALKPHOS 428*  BILITOT 11.6*   PT/INR  Recent Labs  03/15/16 1116  LABPROT 12.6  INR 0.94    Studies/Results: Ct Abdomen Pelvis W Contrast  Result Date: 03/15/2016 CLINICAL DATA:  Nausea and generalized weakness. Jaundice. Diabetes. Gastroesophageal reflux disease. EXAM: CT ABDOMEN AND PELVIS WITH CONTRAST TECHNIQUE: Multidetector CT imaging of the abdomen and pelvis was performed using the standard protocol following bolus administration of intravenous contrast. CONTRAST:  131mL ISOVUE-300 IOPAMIDOL (ISOVUE-300) INJECTION 61% COMPARISON:  07/12/2012 abdominal ultrasound. Abdominopelvic CT of 01/13/2008. FINDINGS: Lower chest: Lucencies of the lung bases are likely due to centrilobular emphysema. Mild cardiomegaly, without pericardial pleural effusion. Tiny hiatal hernia. Hepatobiliary: No focal liver lesion. Caudate lobe enlargement, without specific evidence of cirrhosis. Cholecystectomy. Intrahepatic ducts borderline dilated. The common  duct measures upper normal, 10 mm in the porta hepatis on image 30/series 2. Tapers gradually in the region of the pancreatic head. No obstructive mass identified. No calcified stone. Pancreas: Pancreatic atrophy is moderate. The pancreatic duct is moderately dilated at 9 mm in the head on image 29/series 2. Suspect pancreas divisum, with the dorsal duct entering the duodenum on image 32/series 2. No evidence of acute pancreatitis. Spleen: Subcentimeter low-density splenic lesions are of doubtful clinical significance. Adrenals/Urinary Tract: Normal adrenal glands. Too small to characterize lesions in both kidneys. No hydronephrosis. Normal urinary bladder. Stomach/Bowel: Normal remainder of the stomach. Normal colon and terminal ileum. Normal small bowel. Vascular/Lymphatic: Aortic and branch vessel atherosclerosis. No abdominopelvic adenopathy. Reproductive: Hysterectomy.  No adnexal mass. Other: No significant free fluid. Mild pelvic floor laxity. Bilateral fat containing inguinal hernias. Musculoskeletal: Mild osteopenia. Remote seventh lateral right rib fracture. Lumbosacral spondylosis. IMPRESSION: 1. Cholecystectomy with borderline biliary duct dilatation. 2. Pancreatic atrophy with moderate pancreatic duct dilatation, followed to the level of the duodenum. 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 Considerations include otherwise occult stricture, periampullary lesion, or main duct intraductal papillary mucinous neoplasm. Given the combination of borderline biliary duct dilatation, moderate pancreatic duct dilatation, and the clinical history of jaundice, consider further evaluation with ERCP. If the patient is not a good ERCP candidate, MRCP with and without contrast would be another option. 3.  No acute process in the abdomen or pelvis. 4.  Tiny hiatal hernia. 5.  Aortic atherosclerosis. Electronically Signed   By: Abigail Miyamoto M.D.   On: 03/15/2016 13:53   Pertinent GI history :   ERCP - Dr.  Deatra Ina  Procedure date:  01/16/2008  Findings:      Location: [facility].  Patient Name: Reshma, Coomber . MRN:  Procedure Procedures: Therapeutic Endoscopic Retrograde Cholangiopancreatography CPT: [id number]. with SphincterotomyCPT: [id number].  Personnel: Endoscopist: Sandy Salaam. Deatra Ina, MD.  Indications Symptoms: Abdominal pain. Jaundice.  History Current Medications: Patient is not currently taking Coumadin.  Pre-Exam Physical: Performed Dec 18, 2001. Cardio-pulmonary exam, HEENT exam  WNL. Abdominal exam abnormal. Mental status exam WNL. Abnormal PE findings include: moderately diffusely tender.  Exam Monitoring: Pulse and BP monitoring done. Oximetry used. Supplemental O2 given. at 2 Liters.  Sedation Meds: Robinul 0.2 given IV. Versed given IV. Fentanyl given IV. Cetacaine Spray 2 sprays given aerosolized.  Fluoroscopy: Fluoroscopy was used.  VISUALIZATION  Ducts: Common Bile Duct, Left Hepatic Duct, Right Hepatic Duct visualized. Intrahepatic DuctsPancreatic Duct not sought.  Findings - MUCOSAL ABNORMALITY: Cardia  to Body. Subepithelial hemorrhage present. ICD9: Gastritis, Unspecified: 535.50. Comment: Multiple areas of submucosal hemorrhagic mucosa.  No fresh or old blood.  Affected areas are randomly distributed (not linear), as might be expected from a NG tube.   - Normal : Left Hepatic Duct to Major Duodenal Papilla. Comments: Questionable filling defect on initial injection.  Therefore a 50mm sphincterotomy was done. Duct was swept with a 72mm stone extractor balloon on multiple occasions, though no stones were seen.   Assessment ERCP: Exam complete,  Biliary Tract: Exam complete.  Comments: No stones were seen but sphincterotomy completed. Events  Unplanned Intervention: No intervention was required.  Unplanned Events: There were no complications. Plans Medication Plan: Continue current medications   Tye Savoy, NP-C @   03/15/2016, 4:58 PM  Pager number [phone]  GI ATTENDING  REVIEWED ALL LABS AND X-RAYS AND AGREE WITH ABOVE A&P AS OUTLINED AFTER COMPLETE REVIEW WITH ADVANCED PRACTITIONER.  Docia Chuck. Geri Seminole., M.D. [facility] Division of Gastroenterology.

## 2016-03-15 NOTE — Progress Notes (Signed)
PHARMACIST - PHYSICIAN ORDER COMMUNICATION  CONCERNING: P&T Medication Policy on Herbal Medications  DESCRIPTION:  This patient's order for:  Coenzyme Q  has been noted.  This product(s) is classified as an "herbal" or natural product. Due to a lack of definitive safety studies or FDA approval, nonstandard manufacturing practices, plus the potential risk of unknown drug-drug interactions while on inpatient medications, the Pharmacy and Therapeutics Committee does not permit the use of "herbal" or natural products of this type within [facility].   ACTION TAKEN: The pharmacy department is unable to verify this order at this time and your patient has been informed of this safety policy. Please reevaluate patient's clinical condition at discharge and address if the herbal or natural product(s) should be resumed at that time.   Thanks Dorrene German 03/15/2016 5:56 PM

## 2016-03-15 NOTE — ED Notes (Signed)
Pt has had 2 large (~740mL) urine output in a bed pan.

## 2016-03-15 NOTE — ED Triage Notes (Signed)
Per EMS- Patient c/o nausea and generalized weakness. Patient is at her baseline according to family. Patient's husband gave patient 100 units of 70/30 insulin prior to EMS arrival because CBG read Hi.  Patient also had NS 150 ml prior to EMS arrival to the ED

## 2016-03-16 ENCOUNTER — Inpatient Hospital Stay (HOSPITAL_COMMUNITY): Payer: PPO

## 2016-03-16 ENCOUNTER — Encounter (HOSPITAL_COMMUNITY): Payer: Self-pay

## 2016-03-16 DIAGNOSIS — R935 Abnormal findings on diagnostic imaging of other abdominal regions, including retroperitoneum: Secondary | ICD-10-CM | POA: Diagnosis present

## 2016-03-16 DIAGNOSIS — K831 Obstruction of bile duct: Secondary | ICD-10-CM | POA: Diagnosis present

## 2016-03-16 DIAGNOSIS — I83019 Varicose veins of right lower extremity with ulcer of unspecified site: Secondary | ICD-10-CM

## 2016-03-16 DIAGNOSIS — I83029 Varicose veins of left lower extremity with ulcer of unspecified site: Secondary | ICD-10-CM

## 2016-03-16 DIAGNOSIS — K838 Other specified diseases of biliary tract: Secondary | ICD-10-CM

## 2016-03-16 DIAGNOSIS — I5032 Chronic diastolic (congestive) heart failure: Secondary | ICD-10-CM

## 2016-03-16 DIAGNOSIS — E784 Other hyperlipidemia: Secondary | ICD-10-CM

## 2016-03-16 DIAGNOSIS — I10 Essential (primary) hypertension: Secondary | ICD-10-CM

## 2016-03-16 DIAGNOSIS — F322 Major depressive disorder, single episode, severe without psychotic features: Secondary | ICD-10-CM

## 2016-03-16 DIAGNOSIS — E1142 Type 2 diabetes mellitus with diabetic polyneuropathy: Secondary | ICD-10-CM

## 2016-03-16 LAB — COMPREHENSIVE METABOLIC PANEL
ALBUMIN: 2.6 g/dL — AB (ref 3.5–5.0)
ALK PHOS: 406 U/L — AB (ref 38–126)
ALT: 270 U/L — ABNORMAL HIGH (ref 14–54)
ANION GAP: 12 (ref 5–15)
AST: 586 U/L — ABNORMAL HIGH (ref 15–41)
BUN: 45 mg/dL — ABNORMAL HIGH (ref 6–20)
CALCIUM: 8.7 mg/dL — AB (ref 8.9–10.3)
CO2: 27 mmol/L (ref 22–32)
Chloride: 97 mmol/L — ABNORMAL LOW (ref 101–111)
Creatinine, Ser: 1.03 mg/dL — ABNORMAL HIGH (ref 0.44–1.00)
GFR calc Af Amer: >60 mL/min (ref >60)
GFR calc non Af Amer: 54 mL/min — ABNORMAL LOW (ref >60)
GLUCOSE: 120 mg/dL — AB (ref 65–99)
POTASSIUM: 3.4 mmol/L — AB (ref 3.5–5.1)
SODIUM: 136 mmol/L (ref 135–145)
Total Bilirubin: 11.5 mg/dL — ABNORMAL HIGH (ref 0.3–1.2)
Total Protein: 5.9 g/dL — ABNORMAL LOW (ref 6.5–8.1)

## 2016-03-16 LAB — CBC
HEMATOCRIT: 30.5 % — AB (ref 36.0–46.0)
HEMOGLOBIN: 10.1 g/dL — AB (ref 12.0–15.0)
MCH: 26.4 pg (ref 26.0–34.0)
MCHC: 33.1 g/dL (ref 30.0–36.0)
MCV: 79.6 fL (ref 78.0–100.0)
Platelets: 209 10*3/uL (ref 150–400)
RBC: 3.83 MIL/uL — ABNORMAL LOW (ref 3.87–5.11)
RDW: 17 % — ABNORMAL HIGH (ref 11.5–15.5)
WBC: 6.2 10*3/uL (ref 4.0–10.5)

## 2016-03-16 LAB — GLUCOSE, CAPILLARY
GLUCOSE-CAPILLARY: 93 mg/dL (ref 65–99)
GLUCOSE-CAPILLARY: 92 mg/dL (ref 65–99)
GLUCOSE-CAPILLARY: 119 mg/dL — AB (ref 65–99)
Glucose-Capillary: 114 mg/dL — ABNORMAL HIGH (ref 65–99)
Glucose-Capillary: 100 mg/dL — ABNORMAL HIGH (ref 65–99)

## 2016-03-16 LAB — HEPATIC FUNCTION PANEL
ALT: 299 U/L — ABNORMAL HIGH (ref 14–54)
AST: 654 U/L — ABNORMAL HIGH (ref 15–41)
Albumin: 2.7 g/dL — ABNORMAL LOW (ref 3.5–5.0)
Alkaline Phosphatase: 457 U/L — ABNORMAL HIGH (ref 38–126)
BILIRUBIN DIRECT: 9.9 mg/dL — AB (ref 0.1–0.5)
BILIRUBIN TOTAL: 13.2 mg/dL — AB (ref 0.3–1.2)
Indirect Bilirubin: 3.3 mg/dL — ABNORMAL HIGH (ref 0.3–0.9)
Total Protein: 5.9 g/dL — ABNORMAL LOW (ref 6.5–8.1)

## 2016-03-16 MED ORDER — GABAPENTIN 300 MG PO CAPS
600.0000 mg | ORAL_CAPSULE | Freq: Two times a day (BID) | ORAL | Status: DC
  Administered 2016-03-16 – 2016-03-17 (×2): 600 mg via ORAL
  Filled 2016-03-16 (×2): qty 2

## 2016-03-16 MED ORDER — INSULIN GLARGINE 100 UNIT/ML ~~LOC~~ SOLN
30.0000 [IU] | Freq: Every day | SUBCUTANEOUS | Status: DC
  Filled 2016-03-16: qty 0.3

## 2016-03-16 MED ORDER — LORAZEPAM 2 MG/ML IJ SOLN
1.0000 mg | Freq: Once | INTRAMUSCULAR | Status: AC | PRN
  Administered 2016-03-16: 1 mg via INTRAVENOUS
  Filled 2016-03-16: qty 1

## 2016-03-16 MED ORDER — GADOBENATE DIMEGLUMINE 529 MG/ML IV SOLN
20.0000 mL | Freq: Once | INTRAVENOUS | Status: AC | PRN
  Administered 2016-03-16: 20 mL via INTRAVENOUS

## 2016-03-16 MED ORDER — DIPHENHYDRAMINE HCL 25 MG PO CAPS
25.0000 mg | ORAL_CAPSULE | Freq: Four times a day (QID) | ORAL | Status: DC | PRN
  Administered 2016-03-17: 25 mg via ORAL
  Filled 2016-03-16: qty 1

## 2016-03-16 NOTE — Care Management Note (Signed)
Case Management Note  Patient Details  Name: LOGAN SICILIANO MRN: [id number] Date of Birth: [birth year]  Subjective/Objective:   71 yo admitted with Jaundice.                 Action/Plan: Pt from home with spouse. Pt is active with [facility]'s home based palliative care program ([facility]). This was confirmed with [facility] RN [name]. CM will continue to follow and assist with DC plans.   Expected Discharge Date:   (unknown)               Expected Discharge Plan:   (Home with Palliative Care services)  In-House Referral:     Discharge planning Services  CM Consult  Post Acute Care Choice:    Choice offered to:     DME Arranged:    DME Agency:     HH Arranged:  Disease Management [place] Agency:  [place]  Status of Service:  In process, will continue to follow  If discussed at Long Length of Stay Meetings, dates discussed:    Additional CommentsLynnell Catalan, RN 03/16/2016, 9:56 AM  [phone]

## 2016-03-16 NOTE — Progress Notes (Signed)
PROGRESS NOTE    Melissa Myers  [id number] DOB: 1946/02/08 DOA: 03/15/2016 PCP: [name], MD   Brief Narrative:  Melissa Myers is a 71 y.o. female with a history of uncontrolled diabetes, hyperlipidemia, hypertension, chronic diastolic heart failure, depression that presented with jaundice and scleral icterus and found to have hyperbilirubinemia of unknown cause. CT suggesting obstructive physiology. Gastroenterology consult. MRCP is pending.   Assessment & Plan:   Principal Problem:   Hyperbilirubinemia Active Problems:   Dyslipidemia   Essential hypertension   Chronic diastolic heart failure (HCC)   Venous stasis ulcers of both lower extremities (HCC)   Hyperlipidemia   DM (diabetes mellitus), type 2 with renal complications (HCC)   MDD (major depressive disorder), single episode, severe (HCC)   Diabetic peripheral neuropathy ([place])   Jaundice   Hyperbilirubinemia Unknown etiology. Stable and significantly elevated. -fractionated bilirubin today -CMP in AM -GI recommendations: MRCP  Jaundice Scleral icterus Secondary to hyperbilirubinemia -management above  Insulin dependent diabetes mellitus, uncontrolled Last A1C of 14.1 on 02/04/2016 -continue Lantus 30u qhs  Essential hypertension -continue Coreg and hydralazine -continue to hold spironolactone  Neuropathy -continue Lyrica  -gabapentin held on admission  Dyslipidemia -continue fenofibrate and Zetia  Depression -continue Citalopram and Cymbalta  Vascular venous insuffiency No evidence of infection  Hypokalemia Improving -repeat BMP in AM -spironolactone held on admission  Chronic diastolic heart failure Last EF of 65-70% on 11/30/2015. Grade 1 diastolic dysfunction. Currently compensated.    DVT prophylaxis: Heparin subq Code Status: Full code Family Communication: None at bedside Disposition Plan: Discharge in several days pending workup of  hyperbilirubinemia   Consultants:   Gastroenterology  Procedures:  None  Antimicrobials:  None    Subjective: No concerns overnight. No nausea, vomiting or abdominal pain.  Objective: Vitals:   03/15/16 1630 03/15/16 1716 03/15/16 2035 03/16/16 0500  BP: 104/63 (!) 125/56 (!) 106/46 112/67  Pulse: (!) 51 (!) 57 (!) 58 84  Resp: 14 16 18 16   Temp:  98.2 F (36.8 C) 98.3 F (36.8 C) 98 F (36.7 C)  TempSrc:  Oral Oral Oral  SpO2: 96% 99% 99% 100%  Weight:  105.7 kg (233 lb)    Height:  4\' 10"  (1.473 m)      Intake/Output Summary (Last 24 hours) at 03/16/16 1322 Last data filed at 03/16/16 1200  Gross per 24 hour  Intake             3000 ml  Output             1400 ml  Net             1600 ml   Filed Weights   03/15/16 1047 03/15/16 1716  Weight: 105.7 kg (233 lb) 105.7 kg (233 lb)    Examination:  General exam: Appears calm and comfortable Eyes: scleral icterus Respiratory system: Clear to auscultation. Respiratory effort normal. Cardiovascular system: S1 & S2 heard, RRR. No murmurs, rubs, gallops or clicks. Gastrointestinal system: Abdomen is distended, soft and nontender. No organomegaly or masses felt. Normal bowel sounds heard. Central nervous system: Alert and oriented. No focal neurological deficits. Extremities: No edema. No calf tenderness Skin: Jaundiced, erythematous rash on bilateral legs Psychiatry: Judgement and insight appear normal. Mood & affect appropriate.     Data Reviewed: I have personally reviewed following labs and imaging studies  CBC:  Recent Labs Lab 03/15/16 1116 03/16/16 0342  WBC 6.3 6.2  NEUTROABS 3.4  --   HGB 10.3* 10.1*  HCT 31.0* 30.5*  MCV 81.4 79.6  PLT 216 [id number]   Basic Metabolic Panel:  Recent Labs Lab 03/15/16 1116 03/16/16 0342  NA 121* 136  K 3.1* 3.4*  CL 81* 97*  CO2 28 27  GLUCOSE 877* 120*  BUN 49* 45*  CREATININE 1.09* 1.03*  CALCIUM 8.8* 8.7*   GFR: Estimated Creatinine Clearance:  53.6 mL/min (by C-G formula based on SCr of 1.03 mg/dL (H)). Liver Function Tests:  Recent Labs Lab 03/15/16 1116 03/16/16 0342  AST 307* 586*  ALT 259* 270*  ALKPHOS 428* 406*  BILITOT 11.6* 11.5*  PROT 6.8 5.9*  ALBUMIN 2.8* 2.6*    Recent Labs Lab 03/15/16 1116  LIPASE 29    Recent Labs Lab 03/15/16 1116  AMMONIA 49*   Coagulation Profile:  Recent Labs Lab 03/15/16 1116  INR 0.94   Cardiac Enzymes: No results for input(s): CKTOTAL, CKMB, CKMBINDEX, TROPONINI in the last 168 hours. BNP (last 3 results)  Recent Labs  05/04/15 1623 11/04/15 0956  PROBNP 25.0 20.0   HbA1C: No results for input(s): HGBA1C in the last 72 hours. CBG:  Recent Labs Lab 03/15/16 1940 03/15/16 2334 03/16/16 0429 03/16/16 0801 03/16/16 1223  GLUCAP 255* 228* 114* 100* 93   Lipid Profile: No results for input(s): CHOL, HDL, LDLCALC, TRIG, CHOLHDL, LDLDIRECT in the last 72 hours. Thyroid Function Tests: No results for input(s): TSH, T4TOTAL, FREET4, T3FREE, THYROIDAB in the last 72 hours. Anemia Panel: No results for input(s): VITAMINB12, FOLATE, FERRITIN, TIBC, IRON, RETICCTPCT in the last 72 hours. Sepsis Labs:  Recent Labs Lab 03/15/16 1140  LATICACIDVEN 1.42    No results found for this or any previous visit (from the past 240 hour(s)).       Radiology Studies: Ct Abdomen Pelvis W Contrast  Result Date: 03/15/2016 CLINICAL DATA:  Nausea and generalized weakness. Jaundice. Diabetes. Gastroesophageal reflux disease. EXAM: CT ABDOMEN AND PELVIS WITH CONTRAST TECHNIQUE: Multidetector CT imaging of the abdomen and pelvis was performed using the standard protocol following bolus administration of intravenous contrast. CONTRAST:  131mL ISOVUE-300 IOPAMIDOL (ISOVUE-300) INJECTION 61% COMPARISON:  07/12/2012 abdominal ultrasound. Abdominopelvic CT of 01/13/2008. FINDINGS: Lower chest: Lucencies of the lung bases are likely due to centrilobular emphysema. Mild  cardiomegaly, without pericardial pleural effusion. Tiny hiatal hernia. Hepatobiliary: No focal liver lesion. Caudate lobe enlargement, without specific evidence of cirrhosis. Cholecystectomy. Intrahepatic ducts borderline dilated. The common duct measures upper normal, 10 mm in the porta hepatis on image 30/series 2. Tapers gradually in the region of the pancreatic head. No obstructive mass identified. No calcified stone. Pancreas: Pancreatic atrophy is moderate. The pancreatic duct is moderately dilated at 9 mm in the head on image 29/series 2. Suspect pancreas divisum, with the dorsal duct entering the duodenum on image 32/series 2. No evidence of acute pancreatitis. Spleen: Subcentimeter low-density splenic lesions are of doubtful clinical significance. Adrenals/Urinary Tract: Normal adrenal glands. Too small to characterize lesions in both kidneys. No hydronephrosis. Normal urinary bladder. Stomach/Bowel: Normal remainder of the stomach. Normal colon and terminal ileum. Normal small bowel. Vascular/Lymphatic: Aortic and branch vessel atherosclerosis. No abdominopelvic adenopathy. Reproductive: Hysterectomy.  No adnexal mass. Other: No significant free fluid. Mild pelvic floor laxity. Bilateral fat containing inguinal hernias. Musculoskeletal: Mild osteopenia. Remote seventh lateral right rib fracture. Lumbosacral spondylosis. IMPRESSION: 1. Cholecystectomy with borderline biliary duct dilatation. 2. Pancreatic atrophy with moderate pancreatic duct dilatation, followed to the level of the duodenum. 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 Considerations include otherwise occult stricture, periampullary lesion, or main  duct intraductal papillary mucinous neoplasm. Given the combination of borderline biliary duct dilatation, moderate pancreatic duct dilatation, and the clinical history of jaundice, consider further evaluation with ERCP. If the patient is not a good ERCP candidate, MRCP with and without contrast would  be another option. 3.  No acute process in the abdomen or pelvis. 4.  Tiny hiatal hernia. 5.  Aortic atherosclerosis. Electronically Signed   By: Abigail Miyamoto M.D.   On: 03/15/2016 13:53        Scheduled Meds: . aspirin EC  81 mg Oral Daily  . B-complex with vitamin C  1 tablet Oral Daily  . carvedilol  25 mg Oral BID WC  . cholecalciferol  2,000 Units Oral Daily  . citalopram  20 mg Oral Daily  . cyanocobalamin  500 mcg Oral Daily  . DULoxetine  60 mg Oral Daily  . ezetimibe  10 mg Oral Daily  . fenofibrate  160 mg Oral Daily  . heparin  5,000 Units Subcutaneous Q8H  . hydrALAZINE  25 mg Oral Q8H  . insulin aspart  0-20 Units Subcutaneous Q4H  . [START ON 03/17/2016] insulin glargine  30 Units Subcutaneous QHS  . meloxicam  7.5 mg Oral Daily  . pregabalin  200 mg Oral BID  . rOPINIRole  4 mg Oral QHS   Continuous Infusions: . sodium chloride 75 mL/hr at 03/16/16 1258     LOS: 1 day     Cordelia Poche [facility] 03/16/2016, 1:22 PM Pager: [phone]  If 7PM-7AM, please contact night-coverage [email or web address] Password [email or web address] 03/16/2016, 1:22 PM

## 2016-03-16 NOTE — Progress Notes (Signed)
     [facility] Progress Note  Chief Complaint:   jaundice  Subjective: feels the same, really has no complaints just wants to know what is going on with her  Objective:  Vital signs in last 24 hours: Temp:  [98 F (36.7 C)-98.3 F (36.8 C)] 98 F (36.7 C) (01/24 0500) Pulse Rate:  [51-84] 84 (01/24 0500) Resp:  [14-20] 16 (01/24 0500) BP: (104-144)/(46-72) 112/67 (01/24 0500) SpO2:  [94 %-100 %] 100 % (01/24 0500) Weight:  [233 lb (105.7 kg)] 233 lb (105.7 kg) (01/23 1716) Last BM Date: 03/16/16 General:   Alert, obese white female in NAD EENT:  Normal hearing, non icteric sclera, conjunctive pink.  Heart:  Regular rate and rhythm; + murmur, 2-3+ BLE edema Pulm: Normal respiratory effort, lungs Abdomen:  Soft, obese, nontender.  Normal bowel sounds.    Neurologic:  Alert and  oriented x4;  grossly normal neurologically. Psych:  Alert and cooperative. Normal mood and affect.   Intake/Output from previous day: 01/23 0701 - 01/24 0700 In: 3000 [I.V.:1000; IV Piggyback:2000] Out: 750 [Urine:750] Intake/Output this shift: No intake/output data recorded.  Lab Results:  Recent Labs  03/15/16 1116 03/16/16 0342  WBC 6.3 6.2  HGB 10.3* 10.1*  HCT 31.0* 30.5*  PLT 216 209   BMET  Recent Labs  03/15/16 1116 03/16/16 0342  NA 121* 136  K 3.1* 3.4*  CL 81* 97*  CO2 28 27  GLUCOSE 877* 120*  BUN 49* 45*  CREATININE 1.09* 1.03*  CALCIUM 8.8* 8.7*   LFT  Recent Labs  03/16/16 0342  PROT 5.9*  ALBUMIN 2.6*  AST 586*  ALT 270*  ALKPHOS 406*  BILITOT 11.5*   PT/INR  Recent Labs  03/15/16 1116  LABPROT 12.6  INR 0.94    Assessment / Plan:  1. 71 yo female with new markedly abnormal LFTs / jaundice, pruritis and mild weight loss concerning for neoplasm. No pancreatic neoplasm on CTscan but she could have an ampullary lesion vrs biliary stricture secondary to remote sphincterotomy. Biliary stone possible but seems unlikely given overall  picture. LFTs slightly worse today   -MRCP not yet done, further recommendations to follow results.   2. Uncontrolled DM, glucose near normal today.   Active Problems:   Jaundice   Hyperbilirubinemia   LOS: 1 day   Tye Savoy NP 03/16/2016, 12:34 PM  Pager number [phone]  GI ATTENDING  Interval history data reviewed. Patient seen and examined. Agree with interval progress note. Awaiting MRCP. Will need ERCP. Differential diagnosis of her problem and ERCP reviewed.The nature of the procedure, as well as the risks, benefits, and alternatives were carefully and thoroughly reviewed with the patient. Ample time for discussion and questions allowed. The patient understood, was satisfied, and agreed to proceed. The patient is high-risk given her comorbidities  Docia Chuck. Geri Seminole., M.D. [facility] Division of Gastroenterology

## 2016-03-16 NOTE — Progress Notes (Signed)
Nursing Note: Pt educated earlier that she would be NPO after nidnight for procedure in am.Pt has now been reminded and all fluids removed from room and sign on door and on communication board that pt is NPO.wbb

## 2016-03-17 DIAGNOSIS — R739 Hyperglycemia, unspecified: Secondary | ICD-10-CM

## 2016-03-17 DIAGNOSIS — E785 Hyperlipidemia, unspecified: Secondary | ICD-10-CM

## 2016-03-17 DIAGNOSIS — K869 Disease of pancreas, unspecified: Secondary | ICD-10-CM

## 2016-03-17 DIAGNOSIS — E876 Hypokalemia: Secondary | ICD-10-CM

## 2016-03-17 DIAGNOSIS — G9340 Encephalopathy, unspecified: Secondary | ICD-10-CM

## 2016-03-17 LAB — GLUCOSE, CAPILLARY
GLUCOSE-CAPILLARY: 142 mg/dL — AB (ref 65–99)
GLUCOSE-CAPILLARY: 136 mg/dL — AB (ref 65–99)
GLUCOSE-CAPILLARY: 179 mg/dL — AB (ref 65–99)
GLUCOSE-CAPILLARY: 177 mg/dL — AB (ref 65–99)
Glucose-Capillary: 234 mg/dL — ABNORMAL HIGH (ref 65–99)
Glucose-Capillary: 166 mg/dL — ABNORMAL HIGH (ref 65–99)

## 2016-03-17 LAB — BASIC METABOLIC PANEL
Anion gap: 11 (ref 5–15)
BUN: 47 mg/dL — AB (ref 6–20)
CALCIUM: 8.6 mg/dL — AB (ref 8.9–10.3)
CHLORIDE: 97 mmol/L — AB (ref 101–111)
CO2: 25 mmol/L (ref 22–32)
CREATININE: 0.92 mg/dL (ref 0.44–1.00)
GFR calc Af Amer: >60 mL/min (ref >60)
GFR calc non Af Amer: >60 mL/min (ref >60)
GLUCOSE: 174 mg/dL — AB (ref 65–99)
Potassium: 3.5 mmol/L (ref 3.5–5.1)
Sodium: 133 mmol/L — ABNORMAL LOW (ref 135–145)

## 2016-03-17 MED ORDER — PREMIER PROTEIN SHAKE
11.0000 [oz_av] | Freq: Two times a day (BID) | ORAL | Status: DC
  Administered 2016-03-19 – 2016-03-21 (×4): 11 [oz_av] via ORAL
  Filled 2016-03-17 (×9): qty 325.31

## 2016-03-17 MED ORDER — GABAPENTIN 300 MG PO CAPS: 600.0000 mg | ORAL_CAPSULE | Freq: Three times a day (TID) | ORAL | Status: DC

## 2016-03-17 MED ORDER — GABAPENTIN 400 MG PO CAPS
400.0000 mg | ORAL_CAPSULE | Freq: Three times a day (TID) | ORAL | Status: DC
  Administered 2016-03-17 – 2016-03-21 (×9): 400 mg via ORAL
  Filled 2016-03-17 (×9): qty 1

## 2016-03-17 MED ORDER — INSULIN GLARGINE 100 UNIT/ML ~~LOC~~ SOLN
30.0000 [IU] | Freq: Every day | SUBCUTANEOUS | Status: DC
Start: 2016-03-17 — End: 2016-03-21
  Administered 2016-03-17 – 2016-03-21 (×4): 30 [IU] via SUBCUTANEOUS
  Filled 2016-03-17 (×5): qty 0.3

## 2016-03-17 NOTE — Progress Notes (Signed)
[facility] Progress Note  Chief Complaint:  jaundice   Subjective: No complaints. Ate breakfast.   Objective:  Vital signs in last 24 hours: Temp:  [98 F (36.7 C)-98.5 F (36.9 C)] 98 F (36.7 C) (01/25 0454) Pulse Rate:  [49-95] 49 (01/25 0454) Resp:  [16-18] 16 (01/25 0454) BP: (96-118)/(48-55) 118/52 (01/25 0454) SpO2:  [96 %-100 %] 100 % (01/25 0454) Last BM Date: 03/16/16 General:   Alert, obese white famale in NAD. Husband and son in room Heart:  Regular rate and rhythm; + murmurs Pulm: Normal respiratory effor. Abdomen:  Soft, nontender.  Normal bowel sounds    Neurologic:  Alert and  oriented x4;  grossly normal neurologically. Psych:  Alert and cooperative. Normal mood and affect.   Intake/Output from previous day: 01/24 0701 - 01/25 0700 In: 560 [P.O.:560] Out: 1400 [Urine:1400] Intake/Output this shift: No intake/output data recorded.  Lab Results:  Recent Labs  03/15/16 1116 03/16/16 0342  WBC 6.3 6.2  HGB 10.3* 10.1*  HCT 31.0* 30.5*  PLT 216 209   BMET  Recent Labs  03/15/16 1116 03/16/16 0342 03/17/16 0418  NA 121* 136 133*  K 3.1* 3.4* 3.5  CL 81* 97* 97*  CO2 28 27 25   GLUCOSE 877* 120* 174*  BUN 49* 45* 47*  CREATININE 1.09* 1.03* 0.92  CALCIUM 8.8* 8.7* 8.6*   LFT  Recent Labs  03/16/16 2151  PROT 5.9*  ALBUMIN 2.7*  AST 654*  ALT 299*  ALKPHOS 457*  BILITOT 13.2*  BILIDIR 9.9*  IBILI 3.3*   PT/INR  Recent Labs  03/15/16 1116  LABPROT 12.6  INR 0.94   Ct Abdomen Pelvis W Contrast  Result Date: 03/15/2016 CLINICAL DATA:  Nausea and generalized weakness. Jaundice. Diabetes. Gastroesophageal reflux disease. EXAM: CT ABDOMEN AND PELVIS WITH CONTRAST TECHNIQUE: Multidetector CT imaging of the abdomen and pelvis was performed using the standard protocol following bolus administration of intravenous contrast. CONTRAST:  12mL ISOVUE-300 IOPAMIDOL (ISOVUE-300) INJECTION 61% COMPARISON:  07/12/2012  abdominal ultrasound. Abdominopelvic CT of 01/13/2008. FINDINGS: Lower chest: Lucencies of the lung bases are likely due to centrilobular emphysema. Mild cardiomegaly, without pericardial pleural effusion. Tiny hiatal hernia. Hepatobiliary: No focal liver lesion. Caudate lobe enlargement, without specific evidence of cirrhosis. Cholecystectomy. Intrahepatic ducts borderline dilated. The common duct measures upper normal, 10 mm in the porta hepatis on image 30/series 2. Tapers gradually in the region of the pancreatic head. No obstructive mass identified. No calcified stone. Pancreas: Pancreatic atrophy is moderate. The pancreatic duct is moderately dilated at 9 mm in the head on image 29/series 2. Suspect pancreas divisum, with the dorsal duct entering the duodenum on image 32/series 2. No evidence of acute pancreatitis. Spleen: Subcentimeter low-density splenic lesions are of doubtful clinical significance. Adrenals/Urinary Tract: Normal adrenal glands. Too small to characterize lesions in both kidneys. No hydronephrosis. Normal urinary bladder. Stomach/Bowel: Normal remainder of the stomach. Normal colon and terminal ileum. Normal small bowel. Vascular/Lymphatic: Aortic and branch vessel atherosclerosis. No abdominopelvic adenopathy. Reproductive: Hysterectomy.  No adnexal mass. Other: No significant free fluid. Mild pelvic floor laxity. Bilateral fat containing inguinal hernias. Musculoskeletal: Mild osteopenia. Remote seventh lateral right rib fracture. Lumbosacral spondylosis. IMPRESSION: 1. Cholecystectomy with borderline biliary duct dilatation. 2. Pancreatic atrophy with moderate pancreatic duct dilatation, followed to the level of the duodenum. 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 Considerations include otherwise occult stricture, periampullary lesion, or main duct intraductal papillary mucinous neoplasm. Given the combination of borderline biliary duct dilatation, moderate pancreatic duct  dilatation, and  the clinical history of jaundice, consider further evaluation with ERCP. If the patient is not a good ERCP candidate, MRCP with and without contrast would be another option. 3.  No acute process in the abdomen or pelvis. 4.  Tiny hiatal hernia. 5.  Aortic atherosclerosis. Electronically Signed   By: Abigail Miyamoto M.D.   On: 03/15/2016 13:53   Mr 3d Recon At Scanner  Result Date: 03/16/2016 CLINICAL DATA:  Elevated LFTs with jaundice. EXAM: MRI ABDOMEN WITHOUT AND WITH CONTRAST (INCLUDING MRCP) TECHNIQUE: Multiplanar multisequence MR imaging of the abdomen was performed both before and after the administration of intravenous contrast. Heavily T2-weighted images of the biliary and pancreatic ducts were obtained, and three-dimensional MRCP images were rendered by post processing. CONTRAST:  48mL MULTIHANCE GADOBENATE DIMEGLUMINE 529 MG/ML IV SOLN COMPARISON:  CT scan 03/15/2016 FINDINGS: Note: Patient was not alert during the exam and could not reproducibly breath hold. As such, the study is markedly degraded by significant breathing motion artifact. Lower chest:  No evidence for pleural effusion. Hepatobiliary: No gross enhancing liver lesion evident although motion artifact could obscure tiny liver lesions. There is intrahepatic biliary duct dilatation. Common duct is not well seen secondary to the extreme motion artifact but probably measures up to about 16 mm diameter. There is abrupt termination of the common bile duct in the head of the pancreas. No evidence for intraductal stone although, again, assessment is limited by motion degradation. Pancreas: Pancreatic duct is diffusely dilated measuring up to 11-2009 mm diameter in the head of the pancreas. Pancreatic parenchyma is atrophic in the body and tail of pancreas. In the uncinate process, 2.2 x 2.8 cm hypoenhancing lesion is identified in both the pancreatic and common bile ducts terminate at the cranial margin of this lesion. Spleen: 8 mm hypoenhancing  lesion in the lateral spleen, as seen on previous CT scan. This has high signal intensity on T2 weighted imaging and is probably a tiny cyst. Adrenals/Urinary Tract: No adrenal nodule or mass. Small bilateral renal cysts are evident, but not well seen secondary to motion. There is no hydronephrosis in either kidney. Stomach/Bowel: Stomach is nondilated. No evidence for small bowel or colon dilatation within the visualized abdomen. Vascular/Lymphatic: No abdominal aortic aneurysm. No abdominal lymphadenopathy can be identified. Other: No intraperitoneal free fluid. Musculoskeletal: No abnormal marrow enhancement within the visualized bony anatomy. IMPRESSION: 1. Hypoenhancing lesion in the uncinate process of the pancreas, better demonstrated by MR than on the recent CT scan. The dilated main pancreatic duct and dilated common bile duct abruptly terminate at the cranial margin of this lesion. MR imaging features are concerning for pancreatic adenocarcinoma. ERCP with endoscopic ultrasound would likely prove helpful to further evaluate. 2. No definite metastatic disease in the liver. No peripancreatic lymphadenopathy is identified. Of note, this exam is limited by marked motion degradation and small liver lesions could be obscured. CT scan earlier today demonstrated no peripancreatic lymphadenopathy. Electronically Signed   By: Misty Stanley M.D.   On: 03/16/2016 22:25   Mr Abdomen Mrcp Moise Boring Contast  Result Date: 03/16/2016 CLINICAL DATA:  Elevated LFTs with jaundice. EXAM: MRI ABDOMEN WITHOUT AND WITH CONTRAST (INCLUDING MRCP) TECHNIQUE: Multiplanar multisequence MR imaging of the abdomen was performed both before and after the administration of intravenous contrast. Heavily T2-weighted images of the biliary and pancreatic ducts were obtained, and three-dimensional MRCP images were rendered by post processing. CONTRAST:  52mL MULTIHANCE GADOBENATE DIMEGLUMINE 529 MG/ML IV SOLN COMPARISON:  CT scan 03/15/2016  FINDINGS: Note: Patient was not alert during the exam and could not reproducibly breath hold. As such, the study is markedly degraded by significant breathing motion artifact. Lower chest:  No evidence for pleural effusion. Hepatobiliary: No gross enhancing liver lesion evident although motion artifact could obscure tiny liver lesions. There is intrahepatic biliary duct dilatation. Common duct is not well seen secondary to the extreme motion artifact but probably measures up to about 16 mm diameter. There is abrupt termination of the common bile duct in the head of the pancreas. No evidence for intraductal stone although, again, assessment is limited by motion degradation. Pancreas: Pancreatic duct is diffusely dilated measuring up to 11-2009 mm diameter in the head of the pancreas. Pancreatic parenchyma is atrophic in the body and tail of pancreas. In the uncinate process, 2.2 x 2.8 cm hypoenhancing lesion is identified in both the pancreatic and common bile ducts terminate at the cranial margin of this lesion. Spleen: 8 mm hypoenhancing lesion in the lateral spleen, as seen on previous CT scan. This has high signal intensity on T2 weighted imaging and is probably a tiny cyst. Adrenals/Urinary Tract: No adrenal nodule or mass. Small bilateral renal cysts are evident, but not well seen secondary to motion. There is no hydronephrosis in either kidney. Stomach/Bowel: Stomach is nondilated. No evidence for small bowel or colon dilatation within the visualized abdomen. Vascular/Lymphatic: No abdominal aortic aneurysm. No abdominal lymphadenopathy can be identified. Other: No intraperitoneal free fluid. Musculoskeletal: No abnormal marrow enhancement within the visualized bony anatomy. IMPRESSION: 1. Hypoenhancing lesion in the uncinate process of the pancreas, better demonstrated by MR than on the recent CT scan. The dilated main pancreatic duct and dilated common bile duct abruptly terminate at the cranial margin of  this lesion. MR imaging features are concerning for pancreatic adenocarcinoma. ERCP with endoscopic ultrasound would likely prove helpful to further evaluate. 2. No definite metastatic disease in the liver. No peripancreatic lymphadenopathy is identified. Of note, this exam is limited by marked motion degradation and small liver lesions could be obscured. CT scan earlier today demonstrated no peripancreatic lymphadenopathy. Electronically Signed   By: Misty Stanley M.D.   On: 03/16/2016 22:25    Assessment / Plan:  71 yo female with painless obstructive jaundice, pruritis, mild weight loss. MR shows 2.2 x 2.8 hypoenhancing lesion in uncinate process concerning for neoplasm. No metastatic disease identified. Patient was okay with me speaking in family's presence. I told her that findings could represent cancer but workup not yet complete  -obtain CA 19-9, may need EUS for tissue bx  -Will need bilary stent for decompression   Principal Problem:   Hyperbilirubinemia Active Problems:   Dyslipidemia   Essential hypertension   Chronic diastolic heart failure (HCC)   Venous stasis ulcers of both lower extremities ([place])   Hyperlipidemia   DM (diabetes mellitus), type 2 with renal complications ([place])   MDD (major depressive disorder), single episode, severe ([place])   Diabetic peripheral neuropathy ([place])   Jaundice   Obstructive jaundice   Abnormal CT of the abdomen   LOS: 2 days   Tye Savoy NP 03/17/2016, 10:57 AM  Pager number [phone]  GI ATTENDING  Interval history and data reviewed. Patient personally seen and examined. Family members in room. Agree with interval progress note as outlined above. Clinically stable. No new complaints. Reviewed CT scan findings and the concern for underlying pancreatic carcinoma. Again reviewed ERCP in detail. Multiple questions answered.  Docia Chuck. Geri Seminole., M.D. [facility] Division  of Gastroenterology

## 2016-03-17 NOTE — Progress Notes (Signed)
Initial Nutrition Assessment  DOCUMENTATION CODES:   Morbid obesity  INTERVENTION:  Premier Protein BID provides 160 kcal and 30 g protein.   NUTRITION DIAGNOSIS:   Inadequate oral intake related to acute illness as evidenced by per patient/family report, percent weight loss.   GOAL:   Patient will meet greater than or equal to 90% of their needs   MONITOR:   PO intake, Supplement acceptance, I & O's, Labs, Weight trends  REASON FOR ASSESSMENT:   Malnutrition Screening Tool    ASSESSMENT:   Melissa Myers is a 71 y.o. female with a history of uncontrolled diabetes, hyperlipidemia, hypertension, chronic diastolic heart failure, depression that presented with jaundice and scleral icterus and found to have hyperbilirubinemia of unknown cause. CT suggesting obstructive physiology. Gastroenterology consult. MRCP is pending.  Patient still appeared jaundiced. She was tired and responded to questions, but kept eyes closed most of the time while talking. Patient's husband was in the room and also provided feedback to specific questions.  Patient reported appetite has been good with no nausea, vomiting, or abdominal pain present. She stated her appetite at home was good, but she felt too tired to cook anymore. Per patient's husband she is eating about 2 meals per day at home. Patient has been eating during this admission with no issues.   NFPE indicated patient was well nourished. Legs appeared swollen. When asked about this swelling, her husband said her legs has been swollen for about 2-3 years and that "this was the best they have been in a long time."   Per patient report, she has lost 20-30 pounds over the past 6 months. Per patient chart, she has lost 18 lb in the past 3 months (8.6%), which is significant for this time frame.   Patient stated she would be willing to try Premier Protein to provide extra calories and protein and to supplement current diet.   Meds  Reviewed:B Complex with Vitamin C, Coreg, Vitamin D, Cyanocobalamin, Zetia, Fenofibrate, Apresoline, Novolog, Lantus,   Labs Reviewed: CBG 136, Sodium 133, Glucose 174, BUN 47, Calcium 8.6  Diet Order:  Diet Heart Room service appropriate? Yes; Fluid consistency: Thin Diet NPO time specified  Skin:  Wound (see comment) (Diabetic ulcer, right toe)  Last BM:  1/24  Height:   Ht Readings from Last 1 Encounters:  03/15/16 4\' 10"  (1.473 m)    Weight:   Wt Readings from Last 1 Encounters:  03/15/16 233 lb (105.7 kg)    Ideal Body Weight:  43.9 kg  BMI:  Body mass index is 48.7 kg/m- morbid obesity  Estimated Nutritional Needs:   Kcal:  1900-2100  Protein:  65-85 grams  Fluid:  1.9-2.1 L/day  EDUCATION NEEDS:   No education needs identified at this time   Juliann Pulse M.S. Nutrition Dietetic Intern

## 2016-03-17 NOTE — Progress Notes (Signed)
PROGRESS NOTE    Melissa Myers  [id number] DOB: 1945-05-26 DOA: 03/15/2016 PCP: [name], MD   Brief Narrative:  Melissa Myers is a 71 y.o. female with a history of uncontrolled diabetes, hyperlipidemia, hypertension, chronic diastolic heart failure, depression that presented with jaundice and scleral icterus and found to have hyperbilirubinemia of unknown cause. CT suggesting obstructive physiology. Gastroenterology consult. MRCP significant for hypo-enhancing lesion, dilated main pancreatic/common bile ducts concerning for pancreatic adenocarcinoma. ERCP planned.   Assessment & Plan:   Principal Problem:   Hyperbilirubinemia Active Problems:   Dyslipidemia   Essential hypertension   Chronic diastolic heart failure (HCC)   Venous stasis ulcers of both lower extremities (HCC)   Hyperlipidemia   DM (diabetes mellitus), type 2 with renal complications (HCC)   MDD (major depressive disorder), single episode, severe (HCC)   Diabetic peripheral neuropathy (HCC)   Jaundice   Obstructive jaundice   Abnormal CT of the abdomen   Hyperbilirubinemia Likely secondary to obstructive etiology. Worsening and significantly elevated. -CMP in AM -GI recommendations: ERCP (scheduled for 1/26)  Jaundice Scleral icterus Secondary to hyperbilirubinemia -management above  Elevated transaminases Likely secondary to obstructive etiology. Worsening. -management above  Insulin dependent diabetes mellitus, uncontrolled Last A1C of 14.1 on 02/04/2016 -continue Lantus 30u qhs  Essential hypertension -continue Coreg and hydralazine -continue to hold spironolactone  Neuropathy -continue Lyrica  -continue gabapentin, renally dosed  Dyslipidemia -continue fenofibrate and Zetia  Depression -continue Citalopram and Cymbalta  Vascular venous insuffiency No evidence of infection  Hypokalemia Improving -repeat BMP in AM -spironolactone held on  admission  Chronic diastolic heart failure Last EF of 65-70% on 11/30/2015. Grade 1 diastolic dysfunction. Currently compensated.    DVT prophylaxis: Heparin subq Code Status: Full code Family Communication: None at bedside Disposition Plan: Discharge in several days pending workup of hyperbilirubinemia   Consultants:   Gastroenterology  Procedures:  None  Antimicrobials:  None    Subjective: No concerns overnight. No nausea, vomiting or abdominal pain.  Objective: Vitals:   03/16/16 1738 03/16/16 1832 03/16/16 2202 03/17/16 0454  BP: (!) 96/48 (!) 104/53 (!) 114/53 (!) 118/52  Pulse:   95 (!) 49  Resp:   18 16  Temp:   98.5 F (36.9 C) 98 F (36.7 C)  TempSrc:   Oral Oral  SpO2:   96% 100%  Weight:      Height:        Intake/Output Summary (Last 24 hours) at 03/17/16 1345 Last data filed at 03/17/16 0024  Gross per 24 hour  Intake              560 ml  Output              750 ml  Net             -190 ml   Filed Weights   03/15/16 1047 03/15/16 1716  Weight: 105.7 kg (233 lb) 105.7 kg (233 lb)    Examination:  General exam: Appears calm and comfortable Eyes: scleral icterus Respiratory system: Clear to auscultation. Respiratory effort normal. Cardiovascular system: S1 & S2 heard, RRR. No murmurs, rubs, gallops or clicks. Gastrointestinal system: Abdomen is distended, soft and nontender. Normal bowel sounds heard. Central nervous system: Alert and oriented. No focal neurological deficits. Extremities: No edema. No calf tenderness Skin: Jaundiced, erythematous rash on bilateral legs Psychiatry: Judgement and insight appear normal. Mood & affect appropriate.     Data Reviewed: I have personally reviewed following labs and  imaging studies  CBC:  Recent Labs Lab 03/15/16 1116 03/16/16 0342  WBC 6.3 6.2  NEUTROABS 3.4  --   HGB 10.3* 10.1*  HCT 31.0* 30.5*  MCV 81.4 79.6  PLT 216 [id number]   Basic Metabolic Panel:  Recent Labs Lab  03/15/16 1116 03/16/16 0342 03/17/16 0418  NA 121* 136 133*  K 3.1* 3.4* 3.5  CL 81* 97* 97*  CO2 28 27 25   GLUCOSE 877* 120* 174*  BUN 49* 45* 47*  CREATININE 1.09* 1.03* 0.92  CALCIUM 8.8* 8.7* 8.6*   GFR: Estimated Creatinine Clearance: 60 mL/min (by C-G formula based on SCr of 0.92 mg/dL). Liver Function Tests:  Recent Labs Lab 03/15/16 1116 03/16/16 0342 03/16/16 2151  AST 307* 586* 654*  ALT 259* 270* 299*  ALKPHOS 428* 406* 457*  BILITOT 11.6* 11.5* 13.2*  PROT 6.8 5.9* 5.9*  ALBUMIN 2.8* 2.6* 2.7*    Recent Labs Lab 03/15/16 1116  LIPASE 29    Recent Labs Lab 03/15/16 1116  AMMONIA 49*   Coagulation Profile:  Recent Labs Lab 03/15/16 1116  INR 0.94   Cardiac Enzymes: No results for input(s): CKTOTAL, CKMB, CKMBINDEX, TROPONINI in the last 168 hours. BNP (last 3 results)  Recent Labs  05/04/15 1623 11/04/15 0956  PROBNP 25.0 20.0   HbA1C: No results for input(s): HGBA1C in the last 72 hours. CBG:  Recent Labs Lab 03/16/16 2157 03/17/16 0149 03/17/16 0537 03/17/16 0736 03/17/16 1154  GLUCAP 119* 234* 166* 142* 136*   Lipid Profile: No results for input(s): CHOL, HDL, LDLCALC, TRIG, CHOLHDL, LDLDIRECT in the last 72 hours. Thyroid Function Tests: No results for input(s): TSH, T4TOTAL, FREET4, T3FREE, THYROIDAB in the last 72 hours. Anemia Panel: No results for input(s): VITAMINB12, FOLATE, FERRITIN, TIBC, IRON, RETICCTPCT in the last 72 hours. Sepsis Labs:  Recent Labs Lab 03/15/16 1140  LATICACIDVEN 1.42    No results found for this or any previous visit (from the past 240 hour(s)).       Radiology Studies: Mr 3d Recon At Scanner  Result Date: 03/16/2016 CLINICAL DATA:  Elevated LFTs with jaundice. EXAM: MRI ABDOMEN WITHOUT AND WITH CONTRAST (INCLUDING MRCP) TECHNIQUE: Multiplanar multisequence MR imaging of the abdomen was performed both before and after the administration of intravenous contrast. Heavily T2-weighted  images of the biliary and pancreatic ducts were obtained, and three-dimensional MRCP images were rendered by post processing. CONTRAST:  25mL MULTIHANCE GADOBENATE DIMEGLUMINE 529 MG/ML IV SOLN COMPARISON:  CT scan 03/15/2016 FINDINGS: Note: Patient was not alert during the exam and could not reproducibly breath hold. As such, the study is markedly degraded by significant breathing motion artifact. Lower chest:  No evidence for pleural effusion. Hepatobiliary: No gross enhancing liver lesion evident although motion artifact could obscure tiny liver lesions. There is intrahepatic biliary duct dilatation. Common duct is not well seen secondary to the extreme motion artifact but probably measures up to about 16 mm diameter. There is abrupt termination of the common bile duct in the head of the pancreas. No evidence for intraductal stone although, again, assessment is limited by motion degradation. Pancreas: Pancreatic duct is diffusely dilated measuring up to 11-2009 mm diameter in the head of the pancreas. Pancreatic parenchyma is atrophic in the body and tail of pancreas. In the uncinate process, 2.2 x 2.8 cm hypoenhancing lesion is identified in both the pancreatic and common bile ducts terminate at the cranial margin of this lesion. Spleen: 8 mm hypoenhancing lesion in the lateral spleen, as seen on  previous CT scan. This has high signal intensity on T2 weighted imaging and is probably a tiny cyst. Adrenals/Urinary Tract: No adrenal nodule or mass. Small bilateral renal cysts are evident, but not well seen secondary to motion. There is no hydronephrosis in either kidney. Stomach/Bowel: Stomach is nondilated. No evidence for small bowel or colon dilatation within the visualized abdomen. Vascular/Lymphatic: No abdominal aortic aneurysm. No abdominal lymphadenopathy can be identified. Other: No intraperitoneal free fluid. Musculoskeletal: No abnormal marrow enhancement within the visualized bony anatomy. IMPRESSION:  1. Hypoenhancing lesion in the uncinate process of the pancreas, better demonstrated by MR than on the recent CT scan. The dilated main pancreatic duct and dilated common bile duct abruptly terminate at the cranial margin of this lesion. MR imaging features are concerning for pancreatic adenocarcinoma. ERCP with endoscopic ultrasound would likely prove helpful to further evaluate. 2. No definite metastatic disease in the liver. No peripancreatic lymphadenopathy is identified. Of note, this exam is limited by marked motion degradation and small liver lesions could be obscured. CT scan earlier today demonstrated no peripancreatic lymphadenopathy. Electronically Signed   By: Misty Stanley M.D.   On: 03/16/2016 22:25   Mr Abdomen Mrcp Moise Boring Contast  Result Date: 03/16/2016 CLINICAL DATA:  Elevated LFTs with jaundice. EXAM: MRI ABDOMEN WITHOUT AND WITH CONTRAST (INCLUDING MRCP) TECHNIQUE: Multiplanar multisequence MR imaging of the abdomen was performed both before and after the administration of intravenous contrast. Heavily T2-weighted images of the biliary and pancreatic ducts were obtained, and three-dimensional MRCP images were rendered by post processing. CONTRAST:  92mL MULTIHANCE GADOBENATE DIMEGLUMINE 529 MG/ML IV SOLN COMPARISON:  CT scan 03/15/2016 FINDINGS: Note: Patient was not alert during the exam and could not reproducibly breath hold. As such, the study is markedly degraded by significant breathing motion artifact. Lower chest:  No evidence for pleural effusion. Hepatobiliary: No gross enhancing liver lesion evident although motion artifact could obscure tiny liver lesions. There is intrahepatic biliary duct dilatation. Common duct is not well seen secondary to the extreme motion artifact but probably measures up to about 16 mm diameter. There is abrupt termination of the common bile duct in the head of the pancreas. No evidence for intraductal stone although, again, assessment is limited by motion  degradation. Pancreas: Pancreatic duct is diffusely dilated measuring up to 11-2009 mm diameter in the head of the pancreas. Pancreatic parenchyma is atrophic in the body and tail of pancreas. In the uncinate process, 2.2 x 2.8 cm hypoenhancing lesion is identified in both the pancreatic and common bile ducts terminate at the cranial margin of this lesion. Spleen: 8 mm hypoenhancing lesion in the lateral spleen, as seen on previous CT scan. This has high signal intensity on T2 weighted imaging and is probably a tiny cyst. Adrenals/Urinary Tract: No adrenal nodule or mass. Small bilateral renal cysts are evident, but not well seen secondary to motion. There is no hydronephrosis in either kidney. Stomach/Bowel: Stomach is nondilated. No evidence for small bowel or colon dilatation within the visualized abdomen. Vascular/Lymphatic: No abdominal aortic aneurysm. No abdominal lymphadenopathy can be identified. Other: No intraperitoneal free fluid. Musculoskeletal: No abnormal marrow enhancement within the visualized bony anatomy. IMPRESSION: 1. Hypoenhancing lesion in the uncinate process of the pancreas, better demonstrated by MR than on the recent CT scan. The dilated main pancreatic duct and dilated common bile duct abruptly terminate at the cranial margin of this lesion. MR imaging features are concerning for pancreatic adenocarcinoma. ERCP with endoscopic ultrasound would likely prove helpful to  further evaluate. 2. No definite metastatic disease in the liver. No peripancreatic lymphadenopathy is identified. Of note, this exam is limited by marked motion degradation and small liver lesions could be obscured. CT scan earlier today demonstrated no peripancreatic lymphadenopathy. Electronically Signed   By: Misty Stanley M.D.   On: 03/16/2016 22:25        Scheduled Meds: . aspirin EC  81 mg Oral Daily  . B-complex with vitamin C  1 tablet Oral Daily  . carvedilol  25 mg Oral BID WC  . cholecalciferol  2,000  Units Oral Daily  . citalopram  20 mg Oral Daily  . cyanocobalamin  500 mcg Oral Daily  . DULoxetine  60 mg Oral Daily  . ezetimibe  10 mg Oral Daily  . fenofibrate  160 mg Oral Daily  . gabapentin  600 mg Oral BID  . heparin  5,000 Units Subcutaneous Q8H  . hydrALAZINE  25 mg Oral Q8H  . insulin aspart  0-20 Units Subcutaneous Q4H  . insulin glargine  30 Units Subcutaneous QHS  . meloxicam  7.5 mg Oral Daily  . pregabalin  200 mg Oral BID  . protein supplement shake  11 oz Oral BID BM  . rOPINIRole  4 mg Oral QHS   Continuous Infusions: . sodium chloride 75 mL/hr at 03/17/16 0539     LOS: 2 days     Cordelia Poche [facility] 03/17/2016, 1:45 PM Pager: [phone]  If 7PM-7AM, please contact night-coverage [email or web address] Password [email or web address] 03/17/2016, 1:45 PM

## 2016-03-18 ENCOUNTER — Encounter (HOSPITAL_COMMUNITY): Payer: Self-pay | Admitting: Internal Medicine

## 2016-03-18 ENCOUNTER — Inpatient Hospital Stay (HOSPITAL_COMMUNITY): Payer: PPO

## 2016-03-18 ENCOUNTER — Inpatient Hospital Stay (HOSPITAL_COMMUNITY): Payer: PPO | Admitting: Registered Nurse

## 2016-03-18 ENCOUNTER — Encounter (HOSPITAL_COMMUNITY)
Admission: EM | Disposition: A | Discharge status: Skilled Nursing Facility | Payer: Self-pay | Source: Home / Self Care | Attending: Family Medicine

## 2016-03-18 DIAGNOSIS — Z794 Long term (current) use of insulin: Secondary | ICD-10-CM

## 2016-03-18 DIAGNOSIS — E1122 Type 2 diabetes mellitus with diabetic chronic kidney disease: Secondary | ICD-10-CM

## 2016-03-18 DIAGNOSIS — R740 Nonspecific elevation of levels of transaminase and lactic acid dehydrogenase [LDH]: Secondary | ICD-10-CM

## 2016-03-18 DIAGNOSIS — K831 Obstruction of bile duct: Principal | ICD-10-CM

## 2016-03-18 DIAGNOSIS — N184 Chronic kidney disease, stage 4 (severe): Secondary | ICD-10-CM

## 2016-03-18 HISTORY — PX: ERCP: SHX5425

## 2016-03-18 LAB — GLUCOSE, CAPILLARY
GLUCOSE-CAPILLARY: 180 mg/dL — AB (ref 65–99)
GLUCOSE-CAPILLARY: 174 mg/dL — AB (ref 65–99)
GLUCOSE-CAPILLARY: 293 mg/dL — AB (ref 65–99)
Glucose-Capillary: 143 mg/dL — ABNORMAL HIGH (ref 65–99)
Glucose-Capillary: 336 mg/dL — ABNORMAL HIGH (ref 65–99)

## 2016-03-18 LAB — COMPREHENSIVE METABOLIC PANEL
ALT: 274 U/L — AB (ref 14–54)
AST: 473 U/L — ABNORMAL HIGH (ref 15–41)
Albumin: 2.4 g/dL — ABNORMAL LOW (ref 3.5–5.0)
Alkaline Phosphatase: 547 U/L — ABNORMAL HIGH (ref 38–126)
Anion gap: 10 (ref 5–15)
BUN: 38 mg/dL — ABNORMAL HIGH (ref 6–20)
CALCIUM: 9 mg/dL (ref 8.9–10.3)
CO2: 26 mmol/L (ref 22–32)
CREATININE: 0.64 mg/dL (ref 0.44–1.00)
Chloride: 96 mmol/L — ABNORMAL LOW (ref 101–111)
Glucose, Bld: 183 mg/dL — ABNORMAL HIGH (ref 65–99)
Potassium: 3.9 mmol/L (ref 3.5–5.1)
SODIUM: 132 mmol/L — AB (ref 135–145)
Total Bilirubin: 17.2 mg/dL — ABNORMAL HIGH (ref 0.3–1.2)
Total Protein: 6.3 g/dL — ABNORMAL LOW (ref 6.5–8.1)

## 2016-03-18 LAB — AMMONIA: Ammonia: 50 umol/L — ABNORMAL HIGH (ref 9–35)

## 2016-03-18 LAB — CANCER ANTIGEN 19-9: CA 19-9: 107 U/mL — ABNORMAL HIGH (ref 0–35)

## 2016-03-18 SURGERY — ERCP, WITH INTERVENTION IF INDICATED
Anesthesia: General

## 2016-03-18 MED ORDER — SODIUM CHLORIDE 0.9 % IV SOLN
1.5000 g | Freq: Four times a day (QID) | INTRAVENOUS | Status: DC
  Administered 2016-03-18 – 2016-03-20 (×8): 1.5 g via INTRAVENOUS
  Filled 2016-03-18 (×16): qty 1.5

## 2016-03-18 MED ORDER — PROPOFOL 10 MG/ML IV BOLUS
INTRAVENOUS | Status: AC
  Filled 2016-03-18: qty 20

## 2016-03-18 MED ORDER — SUGAMMADEX SODIUM 200 MG/2ML IV SOLN
INTRAVENOUS | Status: DC | PRN
  Administered 2016-03-18: 200 mg via INTRAVENOUS

## 2016-03-18 MED ORDER — INDOMETHACIN 50 MG RE SUPP
RECTAL | Status: DC | PRN
  Administered 2016-03-18: 100 mg via RECTAL

## 2016-03-18 MED ORDER — LIDOCAINE 2% (20 MG/ML) 5 ML SYRINGE
INTRAMUSCULAR | Status: AC
  Filled 2016-03-18: qty 5

## 2016-03-18 MED ORDER — SUCCINYLCHOLINE CHLORIDE 200 MG/10ML IV SOSY
PREFILLED_SYRINGE | INTRAVENOUS | Status: AC
Start: 2016-03-18 — End: 2016-03-18
  Filled 2016-03-18: qty 10

## 2016-03-18 MED ORDER — SUCCINYLCHOLINE CHLORIDE 200 MG/10ML IV SOSY
PREFILLED_SYRINGE | INTRAVENOUS | Status: DC | PRN
  Administered 2016-03-18: 100 mg via INTRAVENOUS

## 2016-03-18 MED ORDER — IOPAMIDOL (ISOVUE-300) INJECTION 61%
INTRAVENOUS | Status: DC | PRN
  Administered 2016-03-18: 100 mL

## 2016-03-18 MED ORDER — CIPROFLOXACIN IN D5W 400 MG/200ML IV SOLN
INTRAVENOUS | Status: DC | PRN
  Administered 2016-03-18: 400 mg via INTRAVENOUS

## 2016-03-18 MED ORDER — ROCURONIUM BROMIDE 10 MG/ML (PF) SYRINGE
PREFILLED_SYRINGE | INTRAVENOUS | Status: DC | PRN
  Administered 2016-03-18: 20 mg via INTRAVENOUS

## 2016-03-18 MED ORDER — EPHEDRINE SULFATE-NACL 50-0.9 MG/10ML-% IV SOSY
PREFILLED_SYRINGE | INTRAVENOUS | Status: DC | PRN
  Administered 2016-03-18: 10 mg via INTRAVENOUS

## 2016-03-18 MED ORDER — INDOMETHACIN 50 MG RE SUPP
RECTAL | Status: AC
  Filled 2016-03-18: qty 2

## 2016-03-18 MED ORDER — DEXAMETHASONE SODIUM PHOSPHATE 10 MG/ML IJ SOLN
INTRAMUSCULAR | Status: AC
  Filled 2016-03-18: qty 1

## 2016-03-18 MED ORDER — GLYCOPYRROLATE 0.2 MG/ML IV SOSY
PREFILLED_SYRINGE | INTRAVENOUS | Status: DC | PRN
  Administered 2016-03-18: .2 mg via INTRAVENOUS

## 2016-03-18 MED ORDER — ROCURONIUM BROMIDE 50 MG/5ML IV SOSY
PREFILLED_SYRINGE | INTRAVENOUS | Status: AC
  Filled 2016-03-18: qty 5

## 2016-03-18 MED ORDER — CIPROFLOXACIN IN D5W 400 MG/200ML IV SOLN
INTRAVENOUS | Status: AC
  Filled 2016-03-18: qty 200

## 2016-03-18 MED ORDER — LIDOCAINE HCL (CARDIAC) 20 MG/ML IV SOLN
INTRAVENOUS | Status: DC | PRN
  Administered 2016-03-18: 100 mg via INTRAVENOUS

## 2016-03-18 MED ORDER — PHENYLEPHRINE HCL 10 MG/ML IJ SOLN
INTRAMUSCULAR | Status: DC | PRN
  Administered 2016-03-18: 25 ug/min via INTRAVENOUS

## 2016-03-18 MED ORDER — ONDANSETRON HCL 4 MG/2ML IJ SOLN
INTRAMUSCULAR | Status: AC
  Filled 2016-03-18: qty 2

## 2016-03-18 MED ORDER — EPHEDRINE 5 MG/ML INJ
INTRAVENOUS | Status: AC
  Filled 2016-03-18: qty 10

## 2016-03-18 MED ORDER — FENTANYL CITRATE (PF) 100 MCG/2ML IJ SOLN
INTRAMUSCULAR | Status: AC
  Filled 2016-03-18: qty 2

## 2016-03-18 MED ORDER — DEXAMETHASONE SODIUM PHOSPHATE 10 MG/ML IJ SOLN
INTRAMUSCULAR | Status: DC | PRN
  Administered 2016-03-18: 10 mg via INTRAVENOUS

## 2016-03-18 MED ORDER — FENTANYL CITRATE (PF) 100 MCG/2ML IJ SOLN
INTRAMUSCULAR | Status: DC | PRN
  Administered 2016-03-18: 50 ug via INTRAVENOUS

## 2016-03-18 MED ORDER — GLUCAGON HCL RDNA (DIAGNOSTIC) 1 MG IJ SOLR
INTRAMUSCULAR | Status: AC
  Filled 2016-03-18: qty 1

## 2016-03-18 NOTE — Anesthesia Procedure Notes (Signed)
Procedure Name: Intubation Date/Time: 03/18/2016 12:32 PM Performed by: Carleene Cooper A Pre-anesthesia Checklist: Patient identified, Emergency Drugs available, Suction available, Patient being monitored and Timeout performed Patient Re-evaluated:Patient Re-evaluated prior to inductionOxygen Delivery Method: Circle system utilized Preoxygenation: Pre-oxygenation with 100% oxygen Intubation Type: IV induction Ventilation: Mask ventilation without difficulty Laryngoscope Size: Glidescope and 3 Grade View: Grade I Tube type: Glide rite Tube size: 7.0 mm Number of attempts: 1 Airway Equipment and Method: Stylet and Video-laryngoscopy Placement Confirmation: ETT inserted through vocal cords under direct vision,  positive ETCO2 and breath sounds checked- equal and bilateral Secured at: 21 cm Tube secured with: Tape Dental Injury: Teeth and Oropharynx as per pre-operative assessment  Comments: Elective Glidescope intubation due to the presence of a small mouth. Grade 1 view. ATOI.

## 2016-03-18 NOTE — Transfer of Care (Signed)
Immediate Anesthesia Transfer of Care Note  Patient: Melissa Myers  Procedure(s) Performed: Procedure(s): ENDOSCOPIC RETROGRADE CHOLANGIOPANCREATOGRAPHY (ERCP) (N/A)  Patient Location: PACU  Anesthesia Type:General  Level of Consciousness: awake, alert , oriented and patient cooperative  Airway & Oxygen Therapy: Patient Spontanous Breathing and Patient connected to face mask oxygen  Post-op Assessment: Report given to RN, Post -op Vital signs reviewed and stable and Patient moving all extremities  Post vital signs: Reviewed and stable  Last Vitals:  Vitals:   03/18/16 0420 03/18/16 1136  BP: (!) 143/65 (!) 145/53  Pulse: (!) 54 (!) 55  Resp: 16 19  Temp: 36.4 C     Last Pain:  Vitals:   03/18/16 0420  TempSrc: Oral  PainSc:          Complications: No apparent anesthesia complications

## 2016-03-18 NOTE — Op Note (Signed)
[facility] Patient Name: Melissa Myers Procedure Date: 03/18/2016 MRN: [id number] Attending MD: Docia Chuck. Henrene Pastor , MD Date of Birth: [birth year] CSN: [id number] Age: 71 Admit Type: Inpatient Procedure:                ERCP, with biliary sphincterotomy and biliary stent                            placement Indications:              Abnormal abdominal CT, Biliary dilation on Computed                            Tomogram Scan, Abnormal MRCP, Jaundice, Elevated                            liver enzymes Providers:                Docia Chuck. Henrene Pastor, MD, Hilma Favors, RN, Despina Pole                            Tech, Technician, Cletis Athens, Technician Referring MD:             [facility] Medicines:                General Anesthesia Complications:            No immediate complications. Estimated Blood Loss:     Estimated blood loss: none. Procedure:                Pre-Anesthesia Assessment:                           - Prior to the procedure, a History and Physical                            was performed, and patient medications and                            allergies were reviewed. The patient's tolerance of                            previous anesthesia was also reviewed. The risks                            and benefits of the procedure and the sedation                            options and risks were discussed with the patient.                            All questions were answered, and informed consent                            was obtained. Prior Anticoagulants: The patient has  taken no previous anticoagulant or antiplatelet                            agents. ASA Grade Assessment: III - A patient with                            severe systemic disease. After reviewing the risks                            and benefits, the patient was deemed in                            satisfactory condition to undergo the procedure.          Ciprofloxacin IV given preprocedure.                           After obtaining informed consent, the scope was                            passed under direct vision. Throughout the                            procedure, the patient's blood pressure, pulse, and                            oxygen saturations were monitored continuously. The                            [id number]) scope was introduced through                            the mouth, and used to inject contrast into and                            used to inject contrast into the bile duct. The                            ERCP was accomplished without difficulty. The                            patient tolerated the procedure well. Scope In: Scope Out: Findings:      EGD: Esophagus was not examined. The stomach revealed some retained       gastric contents but was otherwise grossly normal. The duodenal bulb and       post bulbar duodenum were normal. The major ampulla was normal. No       obvious evidence of prior sphincterotomy. There were 2 small       periampullary diverticula.      X-ray: A scout film of the abdomen was obtained. Surgical clips,       consistent with a previous cholecystectomy, were seen in the area of the       right upper quadrant of the abdomen. The upper GI tract was traversed       under direct vision without detailed examination.  The major papilla was       as described above. The minor papilla was not seen. There was no       manipulation of the pancreatic duct. Initial injection of contrast       yielded a 1.5 cm segment of distal bile duct. Guidewire was advanced       easily beyond this area proximally. Injection of contrast yielded an       amorphous appearance with false lumen injection. The catheter and wire       were removed. The wire was replaced in the distal bile duct. Biliary       sphincterotomy was made with a traction (standard) sphincterotome using       ERBE electrocautery,  over the wire. There was no post-sphincterotomy       bleeding. Cannula was repositioned and the wire was passed into the       proximal biliary tree. Injection of contrast revealed a markedly dilated       proximal biliary tree with a distal bile duct stricture measuring       approximately 3 cm in length. One 10 Fr by 7 cm plastic stent with a       single external flap and a single internal flap was placed into the       common bile duct. Bile flowed through the stent readily. The stent was       in good position. Impression:               1. Malignant-appearing distal bile duct stricture                            status post ERCP with sphincterotomy and plastic                            biliary stent placement                           2. Guidewire finding false lumen with subsequent                            injection as described                           3. Status post cholecystectomy                           . Moderate Sedation:      none Recommendation:           1. Clear liquid diet                           2. Unasyn 1.5 g every 6 hours                           3. Standard post ERCP observation. Indomethacin                            suppositories given                           4.  Trend LFTs                           5. Will need endoscopic ultrasound with biopsy with                            Dr. Ardis Hughs outpatient. Procedure Code(s):        --- Professional ---                           [phone], Endoscopic retrograde                            cholangiopancreatography (ERCP); with placement of                            endoscopic stent into biliary or pancreatic duct,                            including pre- and post-dilation and guide wire                            passage, when performed, including sphincterotomy,                            when performed, each stent Diagnosis Code(s):        --- Professional ---                           R17, Unspecified  jaundice                           R74.8, Abnormal levels of other serum enzymes                           R93.5, Abnormal findings on diagnostic imaging of                            other abdominal regions, including retroperitoneum                           K83.8, Other specified diseases of biliary tract                           R93.2, Abnormal findings on diagnostic imaging of                            liver and biliary tract CPT copyright 2016 [employer]. All rights reserved. The codes documented in this report are preliminary and upon coder review may  be revised to meet current compliance requirements. Docia Chuck. Henrene Pastor, MD 03/18/2016 1:58:43 PM This report has been signed electronically. Number of Addenda: 0

## 2016-03-18 NOTE — Progress Notes (Signed)
PROGRESS NOTE    Melissa Myers  [id number] DOB: [birth year] DOA: 03/15/2016 PCP: [name], MD   Brief Narrative:  Melissa Myers is a 71 y.o. female with a history of uncontrolled diabetes, hyperlipidemia, hypertension, chronic diastolic heart failure, depression that presented with jaundice and scleral icterus and found to have hyperbilirubinemia of unknown cause. CT suggesting obstructive physiology. Gastroenterology consult. MRCP significant for hypo-enhancing lesion, dilated main pancreatic/common bile ducts concerning for pancreatic adenocarcinoma. ERCP planned.   Assessment & Plan:   Principal Problem:   Hyperbilirubinemia Active Problems:   Dyslipidemia   Essential hypertension   Chronic diastolic heart failure (HCC)   Venous stasis ulcers of both lower extremities (HCC)   Hyperlipidemia   DM (diabetes mellitus), type 2 with renal complications (HCC)   MDD (major depressive disorder), single episode, severe (HCC)   Diabetic peripheral neuropathy (HCC)   Jaundice   Obstructive jaundice   Abnormal CT of the abdomen   Bile duct stricture   Hyperbilirubinemia Likely secondary to obstructive etiology. Continues to worsen -CMP in AM -GI recommendations: ERCP (scheduled for today)  Jaundice Scleral icterus Secondary to hyperbilirubinemia -management above  Elevated transaminases Likely secondary to obstructive etiology. Stable. -management above  Insulin dependent diabetes mellitus, uncontrolled Last A1C of 14.1 on 02/04/2016 -continue Lantus 30u qhs -continue SSI  Essential hypertension -continue Coreg and hydralazine -continue to hold spironolactone  Neuropathy -continue Lyrica  -continue gabapentin, renally dosed  Dyslipidemia -continue fenofibrate and Zetia  Depression -continue Citalopram and Cymbalta  Vascular venous insuffiency No evidence of infection  Hypokalemia Improving -repeat BMP in AM -spironolactone held on  admission  Chronic diastolic heart failure Last EF of 65-70% on 11/30/2015. Grade 1 diastolic dysfunction. Currently compensated.    DVT prophylaxis: Heparin subq Code Status: Full code Family Communication: None at bedside Disposition Plan: Discharge in several days pending workup of hyperbilirubinemia   Consultants:   Gastroenterology  Procedures:  None  Antimicrobials:  None    Subjective: No concerns overnight. No nausea, vomiting or abdominal pain.  Objective: Vitals:   03/18/16 0420 03/18/16 1136 03/18/16 1402 03/18/16 1410  BP: (!) 143/65 (!) 145/53 (!) 151/63 (!) 156/68  Pulse: (!) 54 (!) 55 (!) 59   Resp: 16 19 14    Temp: 97.6 F (36.4 C)  97.8 F (36.6 C)   TempSrc: Oral  Oral   SpO2: 97% 94% 98%   Weight:      Height:        Intake/Output Summary (Last 24 hours) at 03/18/16 1416 Last data filed at 03/18/16 1402  Gross per 24 hour  Intake              980 ml  Output              400 ml  Net              580 ml   Filed Weights   03/15/16 1047 03/15/16 1716  Weight: 105.7 kg (233 lb) 105.7 kg (233 lb)    Examination:  General exam: Appears calm and comfortable Eyes: scleral icterus Respiratory system: Clear to auscultation. Respiratory effort normal. Cardiovascular system: S1 & S2 heard, RRR. No murmurs, rubs, gallops or clicks. Gastrointestinal system: Abdomen is distended, soft and nontender. Normal bowel sounds heard. Central nervous system: Alert and oriented. No focal neurological deficits. Extremities: No edema. No calf tenderness Skin: Jaundiced, erythematous rash on bilateral legs Psychiatry: Judgement and insight appear normal. Mood & affect appropriate.  Data Reviewed: I have personally reviewed following labs and imaging studies  CBC:  Recent Labs Lab 03/15/16 1116 03/16/16 0342  WBC 6.3 6.2  NEUTROABS 3.4  --   HGB 10.3* 10.1*  HCT 31.0* 30.5*  MCV 81.4 79.6  PLT 216 [id number]   Basic Metabolic Panel:  Recent  Labs Lab 03/15/16 1116 03/16/16 0342 03/17/16 0418 03/18/16 0409  NA 121* 136 133* 132*  K 3.1* 3.4* 3.5 3.9  CL 81* 97* 97* 96*  CO2 28 27 25 26   GLUCOSE 877* 120* 174* 183*  BUN 49* 45* 47* 38*  CREATININE 1.09* 1.03* 0.92 0.64  CALCIUM 8.8* 8.7* 8.6* 9.0   GFR: Estimated Creatinine Clearance: 69 mL/min (by C-G formula based on SCr of 0.64 mg/dL). Liver Function Tests:  Recent Labs Lab 03/15/16 1116 03/16/16 0342 03/16/16 2151 03/18/16 0409  AST 307* 586* 654* 473*  ALT 259* 270* 299* 274*  ALKPHOS 428* 406* 457* 547*  BILITOT 11.6* 11.5* 13.2* 17.2*  PROT 6.8 5.9* 5.9* 6.3*  ALBUMIN 2.8* 2.6* 2.7* 2.4*    Recent Labs Lab 03/15/16 1116  LIPASE 29    Recent Labs Lab 03/15/16 1116 03/17/16 2337  AMMONIA 49* 50*   Coagulation Profile:  Recent Labs Lab 03/15/16 1116  INR 0.94   Cardiac Enzymes: No results for input(s): CKTOTAL, CKMB, CKMBINDEX, TROPONINI in the last 168 hours. BNP (last 3 results)  Recent Labs  05/04/15 1623 11/04/15 0956  PROBNP 25.0 20.0   HbA1C: No results for input(s): HGBA1C in the last 72 hours. CBG:  Recent Labs Lab 03/17/16 1154 03/17/16 1648 03/17/16 2145 03/18/16 0432 03/18/16 0729  GLUCAP 136* 179* 177* 180* 143*   Lipid Profile: No results for input(s): CHOL, HDL, LDLCALC, TRIG, CHOLHDL, LDLDIRECT in the last 72 hours. Thyroid Function Tests: No results for input(s): TSH, T4TOTAL, FREET4, T3FREE, THYROIDAB in the last 72 hours. Anemia Panel: No results for input(s): VITAMINB12, FOLATE, FERRITIN, TIBC, IRON, RETICCTPCT in the last 72 hours. Sepsis Labs:  Recent Labs Lab 03/15/16 1140  LATICACIDVEN 1.42    No results found for this or any previous visit (from the past 240 hour(s)).       Radiology Studies: Mr 3d Recon At Scanner  Result Date: 03/16/2016 CLINICAL DATA:  Elevated LFTs with jaundice. EXAM: MRI ABDOMEN WITHOUT AND WITH CONTRAST (INCLUDING MRCP) TECHNIQUE: Multiplanar multisequence  MR imaging of the abdomen was performed both before and after the administration of intravenous contrast. Heavily T2-weighted images of the biliary and pancreatic ducts were obtained, and three-dimensional MRCP images were rendered by post processing. CONTRAST:  79mL MULTIHANCE GADOBENATE DIMEGLUMINE 529 MG/ML IV SOLN COMPARISON:  CT scan 03/15/2016 FINDINGS: Note: Patient was not alert during the exam and could not reproducibly breath hold. As such, the study is markedly degraded by significant breathing motion artifact. Lower chest:  No evidence for pleural effusion. Hepatobiliary: No gross enhancing liver lesion evident although motion artifact could obscure tiny liver lesions. There is intrahepatic biliary duct dilatation. Common duct is not well seen secondary to the extreme motion artifact but probably measures up to about 16 mm diameter. There is abrupt termination of the common bile duct in the head of the pancreas. No evidence for intraductal stone although, again, assessment is limited by motion degradation. Pancreas: Pancreatic duct is diffusely dilated measuring up to 11-2009 mm diameter in the head of the pancreas. Pancreatic parenchyma is atrophic in the body and tail of pancreas. In the uncinate process, 2.2 x 2.8 cm hypoenhancing lesion  is identified in both the pancreatic and common bile ducts terminate at the cranial margin of this lesion. Spleen: 8 mm hypoenhancing lesion in the lateral spleen, as seen on previous CT scan. This has high signal intensity on T2 weighted imaging and is probably a tiny cyst. Adrenals/Urinary Tract: No adrenal nodule or mass. Small bilateral renal cysts are evident, but not well seen secondary to motion. There is no hydronephrosis in either kidney. Stomach/Bowel: Stomach is nondilated. No evidence for small bowel or colon dilatation within the visualized abdomen. Vascular/Lymphatic: No abdominal aortic aneurysm. No abdominal lymphadenopathy can be identified. Other: No  intraperitoneal free fluid. Musculoskeletal: No abnormal marrow enhancement within the visualized bony anatomy. IMPRESSION: 1. Hypoenhancing lesion in the uncinate process of the pancreas, better demonstrated by MR than on the recent CT scan. The dilated main pancreatic duct and dilated common bile duct abruptly terminate at the cranial margin of this lesion. MR imaging features are concerning for pancreatic adenocarcinoma. ERCP with endoscopic ultrasound would likely prove helpful to further evaluate. 2. No definite metastatic disease in the liver. No peripancreatic lymphadenopathy is identified. Of note, this exam is limited by marked motion degradation and small liver lesions could be obscured. CT scan earlier today demonstrated no peripancreatic lymphadenopathy. Electronically Signed   By: Misty Stanley M.D.   On: 03/16/2016 22:25   Mr Abdomen Mrcp Moise Boring Contast  Result Date: 03/16/2016 CLINICAL DATA:  Elevated LFTs with jaundice. EXAM: MRI ABDOMEN WITHOUT AND WITH CONTRAST (INCLUDING MRCP) TECHNIQUE: Multiplanar multisequence MR imaging of the abdomen was performed both before and after the administration of intravenous contrast. Heavily T2-weighted images of the biliary and pancreatic ducts were obtained, and three-dimensional MRCP images were rendered by post processing. CONTRAST:  65mL MULTIHANCE GADOBENATE DIMEGLUMINE 529 MG/ML IV SOLN COMPARISON:  CT scan 03/15/2016 FINDINGS: Note: Patient was not alert during the exam and could not reproducibly breath hold. As such, the study is markedly degraded by significant breathing motion artifact. Lower chest:  No evidence for pleural effusion. Hepatobiliary: No gross enhancing liver lesion evident although motion artifact could obscure tiny liver lesions. There is intrahepatic biliary duct dilatation. Common duct is not well seen secondary to the extreme motion artifact but probably measures up to about 16 mm diameter. There is abrupt termination of the common  bile duct in the head of the pancreas. No evidence for intraductal stone although, again, assessment is limited by motion degradation. Pancreas: Pancreatic duct is diffusely dilated measuring up to 11-2009 mm diameter in the head of the pancreas. Pancreatic parenchyma is atrophic in the body and tail of pancreas. In the uncinate process, 2.2 x 2.8 cm hypoenhancing lesion is identified in both the pancreatic and common bile ducts terminate at the cranial margin of this lesion. Spleen: 8 mm hypoenhancing lesion in the lateral spleen, as seen on previous CT scan. This has high signal intensity on T2 weighted imaging and is probably a tiny cyst. Adrenals/Urinary Tract: No adrenal nodule or mass. Small bilateral renal cysts are evident, but not well seen secondary to motion. There is no hydronephrosis in either kidney. Stomach/Bowel: Stomach is nondilated. No evidence for small bowel or colon dilatation within the visualized abdomen. Vascular/Lymphatic: No abdominal aortic aneurysm. No abdominal lymphadenopathy can be identified. Other: No intraperitoneal free fluid. Musculoskeletal: No abnormal marrow enhancement within the visualized bony anatomy. IMPRESSION: 1. Hypoenhancing lesion in the uncinate process of the pancreas, better demonstrated by MR than on the recent CT scan. The dilated main pancreatic duct and  dilated common bile duct abruptly terminate at the cranial margin of this lesion. MR imaging features are concerning for pancreatic adenocarcinoma. ERCP with endoscopic ultrasound would likely prove helpful to further evaluate. 2. No definite metastatic disease in the liver. No peripancreatic lymphadenopathy is identified. Of note, this exam is limited by marked motion degradation and small liver lesions could be obscured. CT scan earlier today demonstrated no peripancreatic lymphadenopathy. Electronically Signed   By: Misty Stanley M.D.   On: 03/16/2016 22:25        Scheduled Meds: .  ampicillin-sulbactam (UNASYN) IV  1.5 g Intravenous Q6H  . [MAR Hold] aspirin EC  81 mg Oral Daily  . [MAR Hold] B-complex with vitamin C  1 tablet Oral Daily  . [MAR Hold] carvedilol  25 mg Oral BID WC  . [MAR Hold] cholecalciferol  2,000 Units Oral Daily  . [MAR Hold] citalopram  20 mg Oral Daily  . [MAR Hold] cyanocobalamin  500 mcg Oral Daily  . [MAR Hold] DULoxetine  60 mg Oral Daily  . [MAR Hold] ezetimibe  10 mg Oral Daily  . [MAR Hold] fenofibrate  160 mg Oral Daily  . [MAR Hold] gabapentin  400 mg Oral TID  . [MAR Hold] heparin  5,000 Units Subcutaneous Q8H  . [MAR Hold] hydrALAZINE  25 mg Oral Q8H  . [MAR Hold] insulin aspart  0-20 Units Subcutaneous Q4H  . [MAR Hold] insulin glargine  30 Units Subcutaneous Daily  . [MAR Hold] meloxicam  7.5 mg Oral Daily  . [MAR Hold] pregabalin  200 mg Oral BID  . [MAR Hold] protein supplement shake  11 oz Oral BID BM  . [MAR Hold] rOPINIRole  4 mg Oral QHS   Continuous Infusions: . sodium chloride 75 mL/hr at 03/18/16 0955     LOS: 3 days     Cordelia Poche [facility] 03/18/2016, 2:16 PM Pager: [phone]  If 7PM-7AM, please contact night-coverage [email or web address] Password [email or web address] 03/18/2016, 2:16 PM

## 2016-03-18 NOTE — Progress Notes (Signed)
Brief Pre-procedure visit.  Progressive cholestasis. Patient visiting with her Minister. She has no complaints but feels a little confused today. VSS. Ready for ERCP at noon.

## 2016-03-18 NOTE — Anesthesia Postprocedure Evaluation (Signed)
Anesthesia Post Note  Patient: Melissa Myers  Procedure(s) Performed: Procedure(s) (LRB): ENDOSCOPIC RETROGRADE CHOLANGIOPANCREATOGRAPHY (ERCP) (N/A)  Patient location during evaluation: PACU Anesthesia Type: General Level of consciousness: awake and alert Pain management: pain level controlled Vital Signs Assessment: post-procedure vital signs reviewed and stable Respiratory status: spontaneous breathing, nonlabored ventilation, respiratory function stable and patient connected to nasal cannula oxygen Cardiovascular status: blood pressure returned to baseline and stable Postop Assessment: no signs of nausea or vomiting Anesthetic complications: no       Last Vitals:  Vitals:   03/18/16 1136 03/18/16 1402  BP: (!) 145/53 (!) 151/63  Pulse: (!) 55 (!) 59  Resp: 19 14  Temp:  36.6 C    Last Pain:  Vitals:   03/18/16 1402  TempSrc: Oral  PainSc: 0-No pain                 [name]

## 2016-03-18 NOTE — Anesthesia Preprocedure Evaluation (Addendum)
Anesthesia Evaluation  Patient identified by MRN, date of birth, ID band Patient awake  General Assessment Comment:jaundiced  Reviewed: Allergy & Precautions, NPO status , reviewed documented beta blocker date and time   History of Anesthesia Complications Negative for: history of anesthetic complications  Airway Mallampati: III  TM Distance: <3 FB Neck ROM: Full  Mouth opening: Limited Mouth Opening  Dental  (+) Teeth Intact, Dental Advisory Given   Pulmonary Recent URI ,    breath sounds clear to auscultation       Cardiovascular hypertension, +CHF   Rhythm:Regular Rate:Normal     Neuro/Psych  Neuromuscular disease    GI/Hepatic GERD  ,  Endo/Other  diabetes, Poorly Controlled  Renal/GU Renal disease     Musculoskeletal  (+) Arthritis ,   Abdominal (+) + obese,   Peds  Hematology  (+) anemia ,   Anesthesia Other Findings   Reproductive/Obstetrics                            Anesthesia Physical Anesthesia Plan  ASA: III  Anesthesia Plan: General   Post-op Pain Management:    Induction: Intravenous  Airway Management Planned: Oral ETT and Video Laryngoscope Planned  Additional Equipment:   Intra-op Plan:   Post-operative Plan: Extubation in OR  Informed Consent: I have reviewed the patients History and Physical, chart, labs and discussed the procedure including the risks, benefits and alternatives for the proposed anesthesia with the patient or authorized representative who has indicated his/her understanding and acceptance.   Dental advisory given  Plan Discussed with: CRNA  Anesthesia Plan Comments:        Anesthesia Quick Evaluation

## 2016-03-19 DIAGNOSIS — K8689 Other specified diseases of pancreas: Secondary | ICD-10-CM | POA: Diagnosis present

## 2016-03-19 LAB — CBC
HEMATOCRIT: 31.7 % — AB (ref 36.0–46.0)
HEMOGLOBIN: 10.7 g/dL — AB (ref 12.0–15.0)
MCH: 27.4 pg (ref 26.0–34.0)
MCHC: 33.8 g/dL (ref 30.0–36.0)
MCV: 81.1 fL (ref 78.0–100.0)
Platelets: 248 10*3/uL (ref 150–400)
RBC: 3.91 MIL/uL (ref 3.87–5.11)
RDW: 19.6 % — ABNORMAL HIGH (ref 11.5–15.5)
WBC: 5.6 10*3/uL (ref 4.0–10.5)

## 2016-03-19 LAB — GLUCOSE, CAPILLARY
GLUCOSE-CAPILLARY: 270 mg/dL — AB (ref 65–99)
GLUCOSE-CAPILLARY: 293 mg/dL — AB (ref 65–99)
Glucose-Capillary: 230 mg/dL — ABNORMAL HIGH (ref 65–99)
Glucose-Capillary: 241 mg/dL — ABNORMAL HIGH (ref 65–99)
Glucose-Capillary: 292 mg/dL — ABNORMAL HIGH (ref 65–99)
Glucose-Capillary: 302 mg/dL — ABNORMAL HIGH (ref 65–99)

## 2016-03-19 LAB — COMPREHENSIVE METABOLIC PANEL
ALBUMIN: 2.4 g/dL — AB (ref 3.5–5.0)
ALK PHOS: 583 U/L — AB (ref 38–126)
ALT: 236 U/L — ABNORMAL HIGH (ref 14–54)
ANION GAP: 9 (ref 5–15)
AST: 299 U/L — AB (ref 15–41)
BILIRUBIN TOTAL: 14.2 mg/dL — AB (ref 0.3–1.2)
BUN: 29 mg/dL — AB (ref 6–20)
CALCIUM: 8.8 mg/dL — AB (ref 8.9–10.3)
CO2: 24 mmol/L (ref 22–32)
Chloride: 102 mmol/L (ref 101–111)
Creatinine, Ser: 0.45 mg/dL (ref 0.44–1.00)
GFR calc Af Amer: >60 mL/min (ref >60)
GFR calc non Af Amer: >60 mL/min (ref >60)
GLUCOSE: 276 mg/dL — AB (ref 65–99)
Potassium: 4.3 mmol/L (ref 3.5–5.1)
Sodium: 135 mmol/L (ref 135–145)
TOTAL PROTEIN: 5.8 g/dL — AB (ref 6.5–8.1)

## 2016-03-19 NOTE — Progress Notes (Signed)
PROGRESS NOTE    Melissa Myers  [id number] DOB: 11/29/1945 DOA: 03/15/2016 PCP: [name], MD   Brief Narrative:  Melissa Myers is a 71 y.o. female with a history of uncontrolled diabetes, hyperlipidemia, hypertension, chronic diastolic heart failure, depression that presented with jaundice and scleral icterus and found to have hyperbilirubinemia of unknown cause. CT suggesting obstructive physiology. Gastroenterology consult. MRCP significant for hypo-enhancing lesion, dilated main pancreatic/common bile ducts concerning for pancreatic adenocarcinoma. ERCP planned.   Assessment & Plan:   Principal Problem:   Hyperbilirubinemia Active Problems:   Dyslipidemia   Essential hypertension   Chronic diastolic heart failure (HCC)   Venous stasis ulcers of both lower extremities (HCC)   Hyperlipidemia   DM (diabetes mellitus), type 2 with renal complications (HCC)   MDD (major depressive disorder), single episode, severe (HCC)   Diabetic peripheral neuropathy (HCC)   Jaundice   Obstructive jaundice   Abnormal CT of the abdomen   Bile duct stricture   Hyperbilirubinemia Likely secondary to obstructive etiology. ERCP performed yesterday and stent placed Improved since stent placement. Patient started on clear diet and Unasyn. -Daily CMP -PT eval -GI recommendations  Jaundice Scleral icterus Secondary to hyperbilirubinemia -management above  Elevated transaminases Likely secondary to obstructive etiology. Stable. -management above  Insulin dependent diabetes mellitus, uncontrolled Last A1C of 14.1 on 02/04/2016. Appears Lantus was not given yesterday which explains why patient's blood sugars were increased yesterday into this morning. She required 40 units of aspart in the last 24 hours. -continue Lantus 30u daily -continue SSI  Essential hypertension -continue Coreg and hydralazine -continue to hold spironolactone  Neuropathy -continue Lyrica    -continue gabapentin, renally dosed  Dyslipidemia -continue fenofibrate and Zetia  Depression -continue Citalopram and Cymbalta  Vascular venous insuffiency No evidence of infection  Hypokalemia Resolved -spironolactone held on admission  Chronic diastolic heart failure Last EF of 65-70% on 11/30/2015. Grade 1 diastolic dysfunction. Currently compensated.   ?PAD Reduced DP pulse on left. Patient has a history ABIs in 2017 of 1.07 in right and 0.98 in left legs -outpatient management   DVT prophylaxis: Heparin subq Code Status: Full code Family Communication: None at bedside Disposition Plan: Discharge in several days pending workup of hyperbilirubinemia   Consultants:   Gastroenterology  Procedures:  None  Antimicrobials:  None    Subjective: Patient has no abdominal pain. Itching is controlled.  Objective: Vitals:   03/18/16 1518 03/18/16 1949 03/19/16 0435 03/19/16 0743  BP: (!) 148/65 (!) 122/51 (!) 123/56 125/65  Pulse: (!) 57 66 81   Resp: 18 16 16    Temp: 98.3 F (36.8 C) 97.8 F (36.6 C) 97.7 F (36.5 C)   TempSrc: Oral Oral Oral   SpO2: 97% 97% 95%   Weight:      Height:        Intake/Output Summary (Last 24 hours) at 03/19/16 1158 Last data filed at 03/19/16 0437  Gross per 24 hour  Intake              720 ml  Output                0 ml  Net              720 ml   Filed Weights   03/15/16 1047 03/15/16 1716  Weight: 105.7 kg (233 lb) 105.7 kg (233 lb)    Examination:  General exam: Appears calm and comfortable Eyes: scleral icterus Respiratory system: Clear to auscultation. Respiratory effort  normal. Cardiovascular system: S1 & S2 heard, RRR. 1/6 systolic murmur. Trace left DP pulse, 2+ on right. Gastrointestinal system: Abdomen is distended, soft and nontender. Normal bowel sounds heard. Central nervous system: Alert and oriented. No focal neurological deficits. Extremities: No edema. No calf tenderness Skin: Jaundiced but  improved, erythematous rash on bilateral legs Psychiatry: Judgement and insight appear normal. Mood & affect appropriate.     Data Reviewed: I have personally reviewed following labs and imaging studies  CBC:  Recent Labs Lab 03/15/16 1116 03/16/16 0342 03/19/16 0359  WBC 6.3 6.2 5.6  NEUTROABS 3.4  --   --   HGB 10.3* 10.1* 10.7*  HCT 31.0* 30.5* 31.7*  MCV 81.4 79.6 81.1  PLT 216 209 [id number]   Basic Metabolic Panel:  Recent Labs Lab 03/15/16 1116 03/16/16 0342 03/17/16 0418 03/18/16 0409 03/19/16 0359  NA 121* 136 133* 132* 135  K 3.1* 3.4* 3.5 3.9 4.3  CL 81* 97* 97* 96* 102  CO2 28 27 25 26 24   GLUCOSE 877* 120* 174* 183* 276*  BUN 49* 45* 47* 38* 29*  CREATININE 1.09* 1.03* 0.92 0.64 0.45  CALCIUM 8.8* 8.7* 8.6* 9.0 8.8*   GFR: Estimated Creatinine Clearance: 69 mL/min (by C-G formula based on SCr of 0.45 mg/dL). Liver Function Tests:  Recent Labs Lab 03/15/16 1116 03/16/16 0342 03/16/16 2151 03/18/16 0409 03/19/16 0359  AST 307* 586* 654* 473* 299*  ALT 259* 270* 299* 274* 236*  ALKPHOS 428* 406* 457* 547* 583*  BILITOT 11.6* 11.5* 13.2* 17.2* 14.2*  PROT 6.8 5.9* 5.9* 6.3* 5.8*  ALBUMIN 2.8* 2.6* 2.7* 2.4* 2.4*    Recent Labs Lab 03/15/16 1116  LIPASE 29    Recent Labs Lab 03/15/16 1116 03/17/16 2337  AMMONIA 49* 50*   Coagulation Profile:  Recent Labs Lab 03/15/16 1116  INR 0.94   Cardiac Enzymes: No results for input(s): CKTOTAL, CKMB, CKMBINDEX, TROPONINI in the last 168 hours. BNP (last 3 results)  Recent Labs  05/04/15 1623 11/04/15 0956  PROBNP 25.0 20.0   HbA1C: No results for input(s): HGBA1C in the last 72 hours. CBG:  Recent Labs Lab 03/18/16 1953 03/19/16 0006 03/19/16 0435 03/19/16 0744 03/19/16 1154  GLUCAP 336* 302* 230* 241* 292*   Lipid Profile: No results for input(s): CHOL, HDL, LDLCALC, TRIG, CHOLHDL, LDLDIRECT in the last 72 hours. Thyroid Function Tests: No results for input(s): TSH,  T4TOTAL, FREET4, T3FREE, THYROIDAB in the last 72 hours. Anemia Panel: No results for input(s): VITAMINB12, FOLATE, FERRITIN, TIBC, IRON, RETICCTPCT in the last 72 hours. Sepsis Labs:  Recent Labs Lab 03/15/16 1140  LATICACIDVEN 1.42    No results found for this or any previous visit (from the past 240 hour(s)).       Radiology Studies: Dg Ercp Biliary & Pancreatic Ducts  Result Date: 03/18/2016 CLINICAL DATA:  Stent placed EXAM: ERCP TECHNIQUE: Multiple spot images obtained with the fluoroscopic device and submitted for interpretation post-procedure. FLUOROSCOPY TIME:  Fluoroscopy Time:  9 minutes and 24 seconds Radiation Exposure Index (if provided by the fluoroscopic device): Number of Acquired Spot Images: 4 COMPARISON:  None. FINDINGS: Cannulation of the common bile duct followed by contrast opacification of the biliary tree and common bile duct stent placement is documented. Contrast clears from the biliary tree after stent placement. IMPRESSION: See above. These images were submitted for radiologic interpretation only. Please see the procedural report for the amount of contrast and the fluoroscopy time utilized. Electronically Signed   By: Marybelle Killings  M.D.   On: 03/18/2016 14:41        Scheduled Meds: . ampicillin-sulbactam (UNASYN) IV  1.5 g Intravenous Q6H  . aspirin EC  81 mg Oral Daily  . B-complex with vitamin C  1 tablet Oral Daily  . carvedilol  25 mg Oral BID WC  . cholecalciferol  2,000 Units Oral Daily  . citalopram  20 mg Oral Daily  . cyanocobalamin  500 mcg Oral Daily  . DULoxetine  60 mg Oral Daily  . ezetimibe  10 mg Oral Daily  . fenofibrate  160 mg Oral Daily  . gabapentin  400 mg Oral TID  . heparin  5,000 Units Subcutaneous Q8H  . hydrALAZINE  25 mg Oral Q8H  . insulin aspart  0-20 Units Subcutaneous Q4H  . insulin glargine  30 Units Subcutaneous Daily  . meloxicam  7.5 mg Oral Daily  . pregabalin  200 mg Oral BID  . protein supplement shake  11  oz Oral BID BM  . rOPINIRole  4 mg Oral QHS   Continuous Infusions: . sodium chloride 75 mL/hr at 03/19/16 0636     LOS: 4 days     Cordelia Poche [facility] 03/19/2016, 11:58 AM Pager: [phone]  If 7PM-7AM, please contact night-coverage [email or web address] Password [facility] 03/19/2016, 11:58 AM

## 2016-03-19 NOTE — Progress Notes (Signed)
HISTORY OF PRESENT ILLNESS:  Melissa Myers is a 71 y.o. female who presented with obstructive jaundice. Multiple other medical problems. ERCP yesterday with biliary stent placement. Suspected to have pancreatic cancer. No new complaints. Actually feels a little better. Laboratories reviewed. LFTs trending in the proper direction. Normal at blood cell count. Patient has not been out of bed  REVIEW OF SYSTEMS:  All non-GI ROS negative for new complaints  Past Medical History:  Diagnosis Date  . Anemia   . Cellulitis    LOWER EXTREMITIES  . Chronic diarrhea    a/w nausea - felt related to IBS  . Deaf    left side only  . Diastolic CHF ([place])   . Disc degeneration, lumbar   . GERD (gastroesophageal reflux disease)   . Hyperlipidemia    hx rhabdo on statins  . Hypertension   . Neuropathy (HCC)    feet, toes and fingers  . On home oxygen therapy    uses oxygen 2 liters min per [place] at night and prn during day  . OSA (obstructive sleep apnea)    05/2009 sleep study - refuses CPAP  . Osteoarthritis   . RLS (restless legs syndrome)   . Shortness of breath    chronic  . Stasis dermatitis   . Type II or unspecified type diabetes mellitus without mention of complication, not stated as uncontrolled    insulin dep    Past Surgical History:  Procedure Laterality Date  . CHOLECYSTECTOMY  1997  . COLONOSCOPY N/A 12/03/2012   Procedure: COLONOSCOPY;  Surgeon: Lafayette Dragon, MD;  Location: [facility];  Service: Endoscopy;  Laterality: N/A;  . TONSILLECTOMY  1970  . TUBAL LIGATION  1980  . UMBILICAL HERNIA REPAIR  1995  . uterine polyp removal  2008    Social History Melissa Myers  reports that she has never smoked. She has never used smokeless tobacco. She reports that she does not drink alcohol or use drugs.  family history includes Heart attack (age of onset: 96) in her mother; Heart attack (age of onset: 17) in her father; Heart disease in her father and mother; Lung  cancer in her paternal grandfather; Stomach cancer in her paternal grandmother.  Allergies  Allergen Reactions  . Cymbalta [Duloxetine Hcl] Other (See Comments)    Restless leg syndrome  . Statins Other (See Comments)    Statin drugs cause muscle pain / "muscle damage"--was told by MD not to take  . Sulfa Antibiotics Diarrhea  . Levemir [Insulin Detemir] Itching  . Morphine Other (See Comments)    GI upset and headaches  . Zinc Swelling and Rash       PHYSICAL EXAMINATION: Vital signs: BP 125/65 (BP Location: Right Arm)   Pulse 81   Temp 97.7 F (36.5 C) (Oral)   Resp 16   Ht 4\' 10"  (1.473 m)   Wt 105.7 kg (233 lb)   SpO2 95%   BMI 48.70 kg/m   Constitutional: Jaundice, obese, chronically ill-appearing, no acute distress Psychiatric: alert and oriented x3, cooperative Eyes: extraocular movements intact, anicteric, conjunctiva pink Mouth: oral pharynx moist, no lesions Neck: supple no lymphadenopathy Cardiovascular: heart regular rate and rhythm, no murmur Lungs: clear to auscultation bilaterally Abdomen: soft, obese, nontender, nondistended, no obvious ascites, no peritoneal signs, normal bowel sounds, no organomegaly Rectal:Omitted Extremities: no clubbing or cyanosis. One to 2+ lower extremity edema bilaterally Skin: Jaundice. Excoriations from itching. Otherwise no lesions on visible extremities Neuro: No focal deficits.  ASSESSMENT:  #1. Obstructive jaundice status post ERCP with biliary stent placement. Doing better postprocedure #2. Pancreatic cancer. Mass on imaging #3. Multiple other medical problems   PLAN:  #1. Advance diet #2. Patient needs to ambulate with assistance #3. Continue antibiotics for one additional day and discontinue if doing well #4. Continue to trend liver tests #5. Patient will need outpatient blood work (liver tests) in one week. We will arrange #6. The patient will need endoscopic ultrasound with biopsy as outpatient. We will  arrange #7. Okay to go home from GI perspective tomorrow if doing well

## 2016-03-20 LAB — COMPREHENSIVE METABOLIC PANEL
ALBUMIN: 2.4 g/dL — AB (ref 3.5–5.0)
ALK PHOS: 473 U/L — AB (ref 38–126)
ALT: 169 U/L — AB (ref 14–54)
AST: 141 U/L — AB (ref 15–41)
Anion gap: 5 (ref 5–15)
BUN: 31 mg/dL — AB (ref 6–20)
CALCIUM: 8.6 mg/dL — AB (ref 8.9–10.3)
CHLORIDE: 104 mmol/L (ref 101–111)
CO2: 26 mmol/L (ref 22–32)
CREATININE: 0.58 mg/dL (ref 0.44–1.00)
GFR calc non Af Amer: >60 mL/min (ref >60)
GLUCOSE: 163 mg/dL — AB (ref 65–99)
Potassium: 4.1 mmol/L (ref 3.5–5.1)
SODIUM: 135 mmol/L (ref 135–145)
Total Bilirubin: 5.9 mg/dL — ABNORMAL HIGH (ref 0.3–1.2)
Total Protein: 5.8 g/dL — ABNORMAL LOW (ref 6.5–8.1)

## 2016-03-20 LAB — GLUCOSE, CAPILLARY
GLUCOSE-CAPILLARY: 184 mg/dL — AB (ref 65–99)
Glucose-Capillary: 136 mg/dL — ABNORMAL HIGH (ref 65–99)
Glucose-Capillary: 156 mg/dL — ABNORMAL HIGH (ref 65–99)
Glucose-Capillary: 159 mg/dL — ABNORMAL HIGH (ref 65–99)
Glucose-Capillary: 176 mg/dL — ABNORMAL HIGH (ref 65–99)
Glucose-Capillary: 225 mg/dL — ABNORMAL HIGH (ref 65–99)
Glucose-Capillary: 245 mg/dL — ABNORMAL HIGH (ref 65–99)

## 2016-03-20 NOTE — Progress Notes (Signed)
HISTORY OF PRESENT ILLNESS:  Melissa Myers is a 71 y.o. female with newly with obstructive jaundice likely secondary to pancreatic cancer status post ERCP with biliary stent placement. Doing well from biliary perspective with improving liver tests and resolution of itching. No GI complaints. Multiple other medical problems. Plans are for her to go to inpatient rehabilitation.  REVIEW OF SYSTEMS:  All non-GI ROS negative except for fatigue and exertional shortness of breath  Past Medical History:  Diagnosis Date  . Anemia   . Cellulitis    LOWER EXTREMITIES  . Chronic diarrhea    a/w nausea - felt related to IBS  . Deaf    left side only  . Diastolic CHF ([place])   . Disc degeneration, lumbar   . GERD (gastroesophageal reflux disease)   . Hyperlipidemia    hx rhabdo on statins  . Hypertension   . Neuropathy (HCC)    feet, toes and fingers  . On home oxygen therapy    uses oxygen 2 liters min per [place] at night and prn during day  . OSA (obstructive sleep apnea)    05/2009 sleep study - refuses CPAP  . Osteoarthritis   . RLS (restless legs syndrome)   . Shortness of breath    chronic  . Stasis dermatitis   . Type II or unspecified type diabetes mellitus without mention of complication, not stated as uncontrolled    insulin dep    Past Surgical History:  Procedure Laterality Date  . CHOLECYSTECTOMY  1997  . COLONOSCOPY N/A 12/03/2012   Procedure: COLONOSCOPY;  Surgeon: Lafayette Dragon, MD;  [facility];  Service: Endoscopy;  Laterality: N/A;  . TONSILLECTOMY  1970  . TUBAL LIGATION  1980  . UMBILICAL HERNIA REPAIR  1995  . uterine polyp removal  2008    Social History LANA PUERTA  reports that she has never smoked. She has never used smokeless tobacco. She reports that she does not drink alcohol or use drugs.  family history includes Heart attack (age of onset: 75) in her mother; Heart attack (age of onset: 5) in her father; Heart disease in her  father and mother; Lung cancer in her paternal grandfather; Stomach cancer in her paternal grandmother.  Allergies  Allergen Reactions  . Cymbalta [Duloxetine Hcl] Other (See Comments)    Restless leg syndrome  . Statins Other (See Comments)    Statin drugs cause muscle pain / "muscle damage"--was told by MD not to take  . Sulfa Antibiotics Diarrhea  . Levemir [Insulin Detemir] Itching  . Morphine Other (See Comments)    GI upset and headaches  . Zinc Swelling and Rash       PHYSICAL EXAMINATION: Vital signs: BP (!) 133/59 (BP Location: Right Arm)   Pulse (!) 59   Temp 98.4 F (36.9 C) (Oral)   Resp 16   Ht 4\' 10"  (1.473 m)   Wt 105.7 kg (233 lb)   SpO2 100%   BMI 48.70 kg/m   Constitutional: Obese, chronically ill-appearing, jaundiced, no acute distress Psychiatric: alert and oriented x3, cooperative Eyes: extraocular movements intact, icterus present, conjunctiva pink Mouth: oral pharynx moist, no lesions Neck: Thick, supple no lymphadenopathy Cardiovascular: heart regular rate and rhythm, no murmur Lungs: clear to auscultation bilaterally Abdomen: soft, obese, nontender, nondistended, no obvious ascites, no peritoneal signs, normal bowel sounds, no organomegaly Rectal: Omitted Extremities: no clubbing or cyanosis; 2+ lower extremity edema bilaterally Skin: no lesions on visible extremities Neuro: No focal deficits.  ASSESSMENT:  #1. Obstructive jaundice likely secondary to pancreatic cancer status post ERCP with biliary stent placement. Liver tests improving #2. Multiple other medical problems   PLAN:  #1. The patient should have follow-up liver tests in 1 week. Either rehabilitation or our office #2. Plans for endoscopic ultrasound with biopsy with Dr. Owens Loffler. This to occur after she completes rehabilitation. I will let Dr. Ardis Hughs and his nurse know. As well daughter knows to contact the office when her mother has completed rehabilitation  Will sign  off

## 2016-03-20 NOTE — Evaluation (Signed)
Physical Therapy Evaluation Patient Details Name: Melissa Myers MRN: [id number] DOB: 1946-01-30 Today's Date: 03/20/2016   History of Present Illness  Pt admitted through ED 03/15/16 with weakness/jaundice.  Underwent ERCP with bilary stent placement 03/18/16  Clinical Impression  Pt admitted as above and presenting with functional mobility limitations 2* generalized weakness, poor endurance, obesity and balance deficits.  Pt would benefit from follow up rehab at SNF level to maximize IND and safety prior to return home.    Follow Up Recommendations SNF    Equipment Recommendations  None recommended by PT    Recommendations for Other Services OT consult     Precautions / Restrictions Precautions Precautions: Fall Restrictions Weight Bearing Restrictions: No      Mobility  Bed Mobility Overal bed mobility: Needs Assistance Bed Mobility: Supine to Sit     Supine to sit: Min assist     General bed mobility comments: increased time with use of pad to complete transition to EOB and physical assist to bring trunk to upright.   Transfers Overall transfer level: Needs assistance Equipment used: Rolling walker (2 wheeled) Transfers: Sit to/from Stand Sit to Stand: Min assist;Mod assist;From elevated surface;+2 safety/equipment         General transfer comment: assist to bring wt up and fwd and to balance in inital standing  Ambulation/Gait Ambulation/Gait assistance: Min assist;+2 physical assistance;+2 safety/equipment Ambulation Distance (Feet): 27 Feet Assistive device: Rolling walker (2 wheeled) Gait Pattern/deviations: Step-to pattern;Decreased step length - right;Decreased step length - left;Shuffle;Trunk flexed Gait velocity: decr Gait velocity interpretation: Below normal speed for age/gender General Gait Details: INcreased time, multiple short standing rest breaks and cues for sequence, posture and position from RW.  Noted mild buckling at L knee. 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    Modified Rankin (Stroke Patients Only)       Balance Overall balance assessment: Needs assistance Sitting-balance support: No upper extremity supported;Feet supported Sitting balance-Leahy Scale: Fair   Postural control: Posterior lean Standing balance support: Bilateral upper extremity supported Standing balance-Leahy Scale: Poor                               Pertinent Vitals/Pain Pain Assessment: 0-10 Pain Score: 4  Pain Location: L knee with WB Pain Descriptors / Indicators: Aching;Sore Pain Intervention(s): Limited activity within patient's tolerance;Monitored during session    Home Living Family/patient expects to be discharged to:: Skilled nursing facility                      Prior Function Level of Independence: Needs assistance   Gait / Transfers Assistance Needed: Pt utilizes rollator and very ltd distance only  ADL's / Homemaking Assistance Needed: Assist of spouse        Hand Dominance   Dominant Hand: Right    Extremity/Trunk Assessment   Upper Extremity Assessment Upper Extremity Assessment: Generalized weakness    Lower Extremity Assessment Lower Extremity Assessment: Generalized weakness    Cervical / Trunk Assessment Cervical / Trunk Assessment: Kyphotic  Communication   Communication: No difficulties  Cognition Arousal/Alertness: Awake/alert Behavior During Therapy: WFL for tasks assessed/performed Overall Cognitive Status: Within Functional Limits for tasks assessed                      General Comments      Exercises General  Exercises - Lower Extremity Ankle Circles/Pumps: AROM;Both;Supine;20 reps   Assessment/Plan    PT Assessment Patient needs continued PT services  PT Problem List Decreased strength;Decreased range of motion;Decreased activity tolerance;Decreased balance;Decreased mobility;Decreased knowledge of use of  DME;Obesity;Pain          PT Treatment Interventions DME instruction;Gait training;Functional mobility training;Therapeutic activities;Therapeutic exercise;Balance training;Patient/family education    PT Goals (Current goals can be found in the Care Plan section)  Acute Rehab PT Goals Patient Stated Goal: Regain IND PT Goal Formulation: With patient Time For Goal Achievement: 04/02/16 Potential to Achieve Goals: Fair    Frequency Min 3X/week   Barriers to discharge        Co-evaluation               End of Session Equipment Utilized During Treatment: Gait belt;Oxygen Activity Tolerance: Patient tolerated treatment well;Patient limited by fatigue Patient left: in chair;with call bell/phone within reach;with chair alarm set Nurse Communication: Mobility status         Time: 1025-1057 PT Time Calculation (min) (ACUTE ONLY): 32 min   Charges:   PT Evaluation $PT Eval Low Complexity: 1 Procedure PT Treatments $Gait Training: 8-22 mins   PT G Codes:        Amandalynn Pitz Mar 29, 2016, 1:11 PM

## 2016-03-20 NOTE — Progress Notes (Signed)
PROGRESS NOTE    Melissa Myers  [id number] DOB: [birth year] DOA: 03/15/2016 PCP: [name], MD   Brief Narrative:  Melissa Myers is a 71 y.o. female with a history of uncontrolled diabetes, hyperlipidemia, hypertension, chronic diastolic heart failure, depression that presented with jaundice and scleral icterus and found to have hyperbilirubinemia of unknown cause. CT suggesting obstructive physiology. Gastroenterology consult. MRCP significant for hypo-enhancing lesion, dilated main pancreatic/common bile ducts concerning for pancreatic adenocarcinoma. ERCP planned.   Assessment & Plan:   Principal Problem:   Hyperbilirubinemia Active Problems:   Dyslipidemia   Essential hypertension   Chronic diastolic heart failure (HCC)   Venous stasis ulcers of both lower extremities (HCC)   Hyperlipidemia   DM (diabetes mellitus), type 2 with renal complications (HCC)   MDD (major depressive disorder), single episode, severe (HCC)   Diabetic peripheral neuropathy (HCC)   Jaundice   Obstructive jaundice   Abnormal CT of the abdomen   Bile duct stricture   Pancreatic mass   Hyperbilirubinemia Likely secondary to obstructive etiology. ERCP performed yesterday and stent placed Improved since stent placement. Improving -Daily CMP -PT eval -GI recommendations: outpatient follow-up  Jaundice Scleral icterus Secondary to hyperbilirubinemia. Improved -management above  Elevated transaminases Likely secondary to obstructive etiology. Improving. -management above  Insulin dependent diabetes mellitus, uncontrolled Last A1C of 14.1 on 02/04/2016. Appears Lantus was not given yesterday which explains why patient's blood sugars were increased yesterday into this morning. She required 40 units of aspart in the last 24 hours. -continue Lantus 30u daily -continue SSI  Essential hypertension -continue Coreg and hydralazine -continue to hold  spironolactone  Neuropathy -continue Lyrica  -continue gabapentin, renally dosed  Dyslipidemia -continue fenofibrate and Zetia  Depression -continue Citalopram and Cymbalta  Vascular venous insuffiency No evidence of infection  Hypokalemia Resolved -spironolactone held on admission  Chronic diastolic heart failure Last EF of 65-70% on 11/30/2015. Grade 1 diastolic dysfunction. Currently compensated.   ?PAD Reduced DP pulse on left. Patient has a history ABIs in 2017 of 1.07 in right and 0.98 in left legs -outpatient management   DVT prophylaxis: Heparin subq Code Status: Full code Family Communication: None at bedside Disposition Plan: Discharge tomorrow   Consultants:   Gastroenterology  Procedures:  None  Antimicrobials:  None    Subjective: Patient feels well today. No nausea or vomiting. Feels weak, however.  Objective: Vitals:   03/19/16 1331 03/19/16 1410 03/19/16 2044 03/20/16 0517  BP: (!) 116/48 (!) 128/48 (!) 122/52 (!) 125/56  Pulse: (!) 58  (!) 52 (!) 54  Resp: 18  17 17   Temp: 98 F (36.7 C)  98.6 F (37 C) 98.2 F (36.8 C)  TempSrc: Oral  Oral Oral  SpO2: 95%  99% 100%  Weight:      Height:        Intake/Output Summary (Last 24 hours) at 03/20/16 1202 Last data filed at 03/20/16 0730  Gross per 24 hour  Intake             2257 ml  Output              600 ml  Net             1657 ml   Filed Weights   03/15/16 1047 03/15/16 1716  Weight: 105.7 kg (233 lb) 105.7 kg (233 lb)    Examination:  General exam: Appears calm and comfortable Eyes: scleral icterus Respiratory system: Clear to auscultation. Respiratory effort normal. Cardiovascular system: S1 &  S2 heard, RRR. 1/6 systolic murmur. Trace left DP pulse, 2+ on right. Gastrointestinal system: Abdomen is distended, soft and nontender. Normal bowel sounds heard. Central nervous system: Alert and oriented. No focal neurological deficits. Extremities: No edema. No calf  tenderness Skin: Jaundiced but improved, erythematous rash on bilateral legs Psychiatry: Judgement and insight appear normal. Mood & affect appropriate.     Data Reviewed: I have personally reviewed following labs and imaging studies  CBC:  Recent Labs Lab 03/15/16 1116 03/16/16 0342 03/19/16 0359  WBC 6.3 6.2 5.6  NEUTROABS 3.4  --   --   HGB 10.3* 10.1* 10.7*  HCT 31.0* 30.5* 31.7*  MCV 81.4 79.6 81.1  PLT 216 209 [id number]   Basic Metabolic Panel:  Recent Labs Lab 03/16/16 0342 03/17/16 0418 03/18/16 0409 03/19/16 0359 03/20/16 0358  NA 136 133* 132* 135 135  K 3.4* 3.5 3.9 4.3 4.1  CL 97* 97* 96* 102 104  CO2 27 25 26 24 26   GLUCOSE 120* 174* 183* 276* 163*  BUN 45* 47* 38* 29* 31*  CREATININE 1.03* 0.92 0.64 0.45 0.58  CALCIUM 8.7* 8.6* 9.0 8.8* 8.6*   GFR: Estimated Creatinine Clearance: 69 mL/min (by C-G formula based on SCr of 0.58 mg/dL). Liver Function Tests:  Recent Labs Lab 03/16/16 0342 03/16/16 2151 03/18/16 0409 03/19/16 0359 03/20/16 0358  AST 586* 654* 473* 299* 141*  ALT 270* 299* 274* 236* 169*  ALKPHOS 406* 457* 547* 583* 473*  BILITOT 11.5* 13.2* 17.2* 14.2* 5.9*  PROT 5.9* 5.9* 6.3* 5.8* 5.8*  ALBUMIN 2.6* 2.7* 2.4* 2.4* 2.4*    Recent Labs Lab 03/15/16 1116  LIPASE 29    Recent Labs Lab 03/15/16 1116 03/17/16 2337  AMMONIA 49* 50*   Coagulation Profile:  Recent Labs Lab 03/15/16 1116  INR 0.94   Cardiac Enzymes: No results for input(s): CKTOTAL, CKMB, CKMBINDEX, TROPONINI in the last 168 hours. BNP (last 3 results)  Recent Labs  05/04/15 1623 11/04/15 0956  PROBNP 25.0 20.0   HbA1C: No results for input(s): HGBA1C in the last 72 hours. CBG:  Recent Labs Lab 03/19/16 2003 03/20/16 0006 03/20/16 0358 03/20/16 0741 03/20/16 1132  GLUCAP 293* 245* 159* 136* 184*   Lipid Profile: No results for input(s): CHOL, HDL, LDLCALC, TRIG, CHOLHDL, LDLDIRECT in the last 72 hours. Thyroid Function Tests: No  results for input(s): TSH, T4TOTAL, FREET4, T3FREE, THYROIDAB in the last 72 hours. Anemia Panel: No results for input(s): VITAMINB12, FOLATE, FERRITIN, TIBC, IRON, RETICCTPCT in the last 72 hours. Sepsis Labs:  Recent Labs Lab 03/15/16 1140  LATICACIDVEN 1.42    No results found for this or any previous visit (from the past 240 hour(s)).       Radiology Studies: Dg Ercp Biliary & Pancreatic Ducts  Result Date: 03/18/2016 CLINICAL DATA:  Stent placed EXAM: ERCP TECHNIQUE: Multiple spot images obtained with the fluoroscopic device and submitted for interpretation post-procedure. FLUOROSCOPY TIME:  Fluoroscopy Time:  9 minutes and 24 seconds Radiation Exposure Index (if provided by the fluoroscopic device): Number of Acquired Spot Images: 4 COMPARISON:  None. FINDINGS: Cannulation of the common bile duct followed by contrast opacification of the biliary tree and common bile duct stent placement is documented. Contrast clears from the biliary tree after stent placement. IMPRESSION: See above. These images were submitted for radiologic interpretation only. Please see the procedural report for the amount of contrast and the fluoroscopy time utilized. Electronically Signed   By: Marybelle Killings M.D.   On: 03/18/2016  14:41        Scheduled Meds: . ampicillin-sulbactam (UNASYN) IV  1.5 g Intravenous Q6H  . aspirin EC  81 mg Oral Daily  . B-complex with vitamin C  1 tablet Oral Daily  . carvedilol  25 mg Oral BID WC  . cholecalciferol  2,000 Units Oral Daily  . citalopram  20 mg Oral Daily  . cyanocobalamin  500 mcg Oral Daily  . DULoxetine  60 mg Oral Daily  . ezetimibe  10 mg Oral Daily  . fenofibrate  160 mg Oral Daily  . gabapentin  400 mg Oral TID  . heparin  5,000 Units Subcutaneous Q8H  . hydrALAZINE  25 mg Oral Q8H  . insulin aspart  0-20 Units Subcutaneous Q4H  . insulin glargine  30 Units Subcutaneous Daily  . meloxicam  7.5 mg Oral Daily  . pregabalin  200 mg Oral BID  .  protein supplement shake  11 oz Oral BID BM  . rOPINIRole  4 mg Oral QHS   Continuous Infusions: . sodium chloride 75 mL/hr at 03/19/16 2327     LOS: 5 days     Cordelia Poche [facility] 03/20/2016, 12:02 PM Pager: [phone]  If 7PM-7AM, please contact night-coverage [email or web address] Password [email or web address] 03/20/2016, 12:02 PM

## 2016-03-21 ENCOUNTER — Telehealth: Payer: Self-pay

## 2016-03-21 ENCOUNTER — Encounter (HOSPITAL_COMMUNITY): Payer: Self-pay | Admitting: Internal Medicine

## 2016-03-21 DIAGNOSIS — D649 Anemia, unspecified: Secondary | ICD-10-CM | POA: Diagnosis not present

## 2016-03-21 DIAGNOSIS — G9340 Encephalopathy, unspecified: Secondary | ICD-10-CM | POA: Diagnosis not present

## 2016-03-21 DIAGNOSIS — K831 Obstruction of bile duct: Secondary | ICD-10-CM | POA: Diagnosis not present

## 2016-03-21 DIAGNOSIS — G894 Chronic pain syndrome: Secondary | ICD-10-CM | POA: Diagnosis not present

## 2016-03-21 DIAGNOSIS — I10 Essential (primary) hypertension: Secondary | ICD-10-CM | POA: Diagnosis not present

## 2016-03-21 DIAGNOSIS — R17 Unspecified jaundice: Secondary | ICD-10-CM | POA: Diagnosis not present

## 2016-03-21 DIAGNOSIS — R278 Other lack of coordination: Secondary | ICD-10-CM | POA: Diagnosis not present

## 2016-03-21 DIAGNOSIS — E1142 Type 2 diabetes mellitus with diabetic polyneuropathy: Secondary | ICD-10-CM | POA: Diagnosis not present

## 2016-03-21 DIAGNOSIS — F322 Major depressive disorder, single episode, severe without psychotic features: Secondary | ICD-10-CM | POA: Diagnosis not present

## 2016-03-21 DIAGNOSIS — K869 Disease of pancreas, unspecified: Secondary | ICD-10-CM | POA: Diagnosis not present

## 2016-03-21 DIAGNOSIS — K838 Other specified diseases of biliary tract: Secondary | ICD-10-CM | POA: Diagnosis not present

## 2016-03-21 DIAGNOSIS — I5032 Chronic diastolic (congestive) heart failure: Secondary | ICD-10-CM | POA: Diagnosis not present

## 2016-03-21 DIAGNOSIS — E785 Hyperlipidemia, unspecified: Secondary | ICD-10-CM | POA: Diagnosis not present

## 2016-03-21 DIAGNOSIS — R935 Abnormal findings on diagnostic imaging of other abdominal regions, including retroperitoneum: Secondary | ICD-10-CM | POA: Diagnosis not present

## 2016-03-21 DIAGNOSIS — R2689 Other abnormalities of gait and mobility: Secondary | ICD-10-CM | POA: Diagnosis not present

## 2016-03-21 DIAGNOSIS — R739 Hyperglycemia, unspecified: Secondary | ICD-10-CM | POA: Diagnosis not present

## 2016-03-21 DIAGNOSIS — G4700 Insomnia, unspecified: Secondary | ICD-10-CM | POA: Diagnosis not present

## 2016-03-21 LAB — COMPREHENSIVE METABOLIC PANEL
ALK PHOS: 369 U/L — AB (ref 38–126)
ALT: 105 U/L — AB (ref 14–54)
ANION GAP: 6 (ref 5–15)
AST: 75 U/L — ABNORMAL HIGH (ref 15–41)
Albumin: 2.4 g/dL — ABNORMAL LOW (ref 3.5–5.0)
BUN: 21 mg/dL — ABNORMAL HIGH (ref 6–20)
CALCIUM: 8.4 mg/dL — AB (ref 8.9–10.3)
CO2: 23 mmol/L (ref 22–32)
CREATININE: 0.59 mg/dL (ref 0.44–1.00)
Chloride: 104 mmol/L (ref 101–111)
GFR calc non Af Amer: >60 mL/min (ref >60)
Glucose, Bld: 135 mg/dL — ABNORMAL HIGH (ref 65–99)
Potassium: 4.5 mmol/L (ref 3.5–5.1)
SODIUM: 133 mmol/L — AB (ref 135–145)
TOTAL PROTEIN: 5.3 g/dL — AB (ref 6.5–8.1)
Total Bilirubin: 4.9 mg/dL — ABNORMAL HIGH (ref 0.3–1.2)

## 2016-03-21 LAB — GLUCOSE, CAPILLARY
GLUCOSE-CAPILLARY: 151 mg/dL — AB (ref 65–99)
GLUCOSE-CAPILLARY: 269 mg/dL — AB (ref 65–99)
Glucose-Capillary: 129 mg/dL — ABNORMAL HIGH (ref 65–99)

## 2016-03-21 MED ORDER — INSULIN ASPART 100 UNIT/ML ~~LOC~~ SOLN
0.0000 [IU] | Freq: Three times a day (TID) | SUBCUTANEOUS | Status: DC
Start: 2016-03-21 — End: 2016-03-21
  Administered 2016-03-21: 11 [IU] via SUBCUTANEOUS

## 2016-03-21 MED ORDER — INSULIN GLARGINE 100 UNIT/ML ~~LOC~~ SOLN
30.0000 [IU] | Freq: Every day | SUBCUTANEOUS | Status: AC
Start: 2016-03-22 — End: ?

## 2016-03-21 MED ORDER — HYDROCODONE-ACETAMINOPHEN 10-325 MG PO TABS: 1.0000 | ORAL_TABLET | Freq: Four times a day (QID) | ORAL | 0 refills | Status: DC | PRN

## 2016-03-21 MED ORDER — DIPHENHYDRAMINE HCL 25 MG PO CAPS
25.0000 mg | ORAL_CAPSULE | Freq: Three times a day (TID) | ORAL | 0 refills | Status: DC | PRN
Start: 2016-03-21 — End: 2016-04-08

## 2016-03-21 MED ORDER — PREMIER PROTEIN SHAKE: 11.0000 [oz_av] | Freq: Two times a day (BID) | ORAL | 0 refills | Status: DC

## 2016-03-21 MED ORDER — TORSEMIDE 100 MG PO TABS: 100.0000 mg | ORAL_TABLET | Freq: Every day | ORAL | Status: DC

## 2016-03-21 MED ORDER — INSULIN ASPART 100 UNIT/ML ~~LOC~~ SOLN
0.0000 [IU] | Freq: Three times a day (TID) | SUBCUTANEOUS | Status: AC
Start: 2016-03-21 — End: ?

## 2016-03-21 MED ORDER — INSULIN ASPART 100 UNIT/ML ~~LOC~~ SOLN: 0.0000 [IU] | Freq: Every day | SUBCUTANEOUS | Status: DC

## 2016-03-21 NOTE — Clinical Social Work Placement (Signed)
   CLINICAL SOCIAL WORK PLACEMENT  NOTE  Date:  03/21/2016  Patient Details  Name: Melissa Myers MRN: [id number] Date of Birth: 1945-08-06  Clinical Social Work is seeking post-discharge placement for this patient at the [place] level of care (*CSW will initial, date and re-position this form in  chart as items are completed):  Yes   Patient/family provided with [place] Work Department's list of facilities offering this level of care within the geographic area requested by the patient (or if unable, by the patient's family).  Yes   Patient/family informed of their freedom to choose among providers that offer the needed level of care, that participate in Medicare, Medicaid or managed care program needed by the patient, have an available bed and are willing to accept the patient.  Yes   Patient/family informed of [facility]'s ownership interest in [facility] and [facility], as well as of the fact that they are under no obligation to receive care at these facilities.  PASRR submitted to EDS on 03/21/16     PASRR number received on 03/21/16     Existing PASRR number confirmed on       FL2 transmitted to all facilities in geographic area requested by pt/family on 03/21/16     FL2 transmitted to all facilities within larger geographic area on       Patient informed that his/her managed care company has contracts with or will negotiate with certain facilities, including the following:        Yes   Patient/family informed of bed offers received.  Patient chooses bed at [facility]     Physician recommends and patient chooses bed at      Patient to be transferred to [facility] on 03/21/16.  Patient to be transferred to facility by [facility]     Patient family notified on 03/21/16 of transfer.  Name of family member notified:  SPOUSE     PHYSICIAN       Additional Comment: CSW met with pt / family to  assist with SNF placement. Pt / family were in agreement with PT recommendation and SNF search was initiated this am. Bed offers were provided and pt chose [facility] for rehab. [employer] insurance was contacted and authorization provided. Pt requested to transport by car. D/C Summary was sent to SNF for review. Scripts included in d/c packet. # for report provided to nsg.   _______________________________________________ Luretha Rued, [place]  [phone] 03/21/2016, 3:30 PM

## 2016-03-21 NOTE — Telephone Encounter (Signed)
Pt scheduled to see Tye Savoy NP 04/18/16@11am . Appt letter mailed to pt.

## 2016-03-21 NOTE — Consult Note (Signed)
   [facility] CM Inpatient Consult   03/21/2016  Melissa Myers [birth year] [id number]   Patient screened for [facility] services. However, she is currently being followed by [facility], home based Palliative Medicine Program administered by [facility]. Therefore, [facility] Care Management not appropriate at this time. Confirmed with [facility] and inpatient RNCM.  Marthenia Rolling, MSN-Ed, RN,BSN [facility] Liaison [phone]

## 2016-03-21 NOTE — Telephone Encounter (Signed)
-----   Message from Irene Shipper, MD sent at 03/21/2016 10:02 AM EST ----- Regarding: FW: Liver tests and EUS Vaughan Basta, if that is his preference, have her see Nevin Bloodgood in the office. She is familiar with the patient. No need for me to use an office base for this purpose. ----- Message ----- From: [name], MD Sent: 03/21/2016   9:18 AM To: Irene Shipper, MD, Algernon Huxley, RN, # Subject: RE: Liver tests and EUS                        I reviewed her chart.  She is pretty unhealthly baseline and going to rehab facility after her hosp admission.  I prefer that she be seen in our office in 3-4 weeks to allow some recovery from her acute illness  (Dr. Henrene Pastor or extender), decide if she is OK for elective EUS then can schedule from that point.  Thanks DJ     ----- Message ----- From: Barron Alvine, RN Sent: 03/21/2016   8:21 AM To: [name], MD Subject: Melton Alar: Liver tests and EUS                          ----- Message ----- From: Irene Shipper, MD Sent: 03/20/2016   5:16 PM To: [name], MD, Algernon Huxley, RN, # Subject: Liver tests and EUS                            Vaughan Basta, This patient presented to the hospital with obstructive jaundice. 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 Biliary stent placed. She is going to inpatient rehabilitation tomorrow for an undefined amount of time. She needs repeat liver test in 1 week. She should also get on Dr. Ardis Hughs scheduled for endoscopic ultrasound with biopsy. Family aware of plans as well. Thanks, jp

## 2016-03-21 NOTE — Progress Notes (Deleted)
Pre visit review using our clinic review tool, if applicable. No additional management support is needed unless otherwise documented below in the visit note. 

## 2016-03-21 NOTE — [place] FL2 (Signed)
[place] MEDICAID FL2 LEVEL OF CARE SCREENING TOOL     IDENTIFICATION  Patient Name: Melissa Myers Birthdate: 25-Apr-1945 Sex: female Admission Date (Current Location): 03/15/2016  [facility] [place] Number:  Herbalist and Address:  [facility],  [address] [place], [place]      Provider Number: [id number]  Attending Physician Name and Address:  Mariel Aloe, MD  Relative Name and Phone Number:       Current Level of Care: Hospital Recommended Level of Care: [place] Prior Approval Number:    Date Approved/Denied:   PASRR Number: [id number]  Discharge Plan: SNF    Current Diagnoses: Patient Active Problem List   Diagnosis Date Noted  . Pancreatic mass   . Bile duct stricture   . Obstructive jaundice   . Abnormal CT of the abdomen   . Jaundice 03/15/2016  . Hyperbilirubinemia 03/15/2016  . Diabetic peripheral neuropathy ([place]) 02/23/2016  . History of fall 10/22/2015  . Bradycardia 10/09/2015  . Left foot pain 07/10/2015  . Murmur, cardiac 07/02/2015  . Chronic pain syndrome 05/05/2015  . MDD (major depressive disorder), single episode, severe ([place]) 05/05/2015  . Chronic anemia 04/14/2015  . DM (diabetes mellitus), type 2 with renal complications ([place]) [id number]  . Rectal bleeding 03/13/2015  . Hyperlipidemia 12/23/2014  . Deficiency anemia 07/26/2012  . Venous stasis ulcers of both lower extremities ([place]) 01/04/2011  . INSOMNIA 08/03/2009  . Chronic diastolic heart failure ([place]) 06/01/2009  . Vitamin D deficiency 05/12/2009  . Sleep apnea 04/29/2009  . Dyslipidemia 04/20/2009  . Morbid obesity ([place]) 04/20/2009  . RESTLESS LEGS SYNDROME 04/20/2009  . Peripheral neuropathy ([place]) 04/20/2009  . Essential hypertension 04/20/2009  . Low back pain 04/20/2009    Orientation RESPIRATION BLADDER Height & Weight     Self, Situation, Place, Time  O2 Continent Weight: 233 lb (105.7 kg) Height:  4\' 10"   (147.3 cm)  BEHAVIORAL SYMPTOMS/MOOD NEUROLOGICAL BOWEL NUTRITION STATUS  Other (Comment) (no behaviors)   Continent Diet  AMBULATORY STATUS COMMUNICATION OF NEEDS Skin   Limited Assist Verbally Other (Comment) (diabetic ulcer R toe)                       Personal Care Assistance Level of Assistance  Bathing, Feeding, Dressing Bathing Assistance: Limited assistance Feeding assistance: Independent Dressing Assistance: Limited assistance     Functional Limitations Info  Sight, Hearing, Speech Sight Info: Adequate Hearing Info: Impaired Speech Info: Adequate    SPECIAL CARE FACTORS FREQUENCY  PT (By licensed PT), OT (By licensed OT)     PT Frequency: 5x wk OT Frequency: 5x wk            Contractures Contractures Info: Not present    Additional Factors Info  Code Status Code Status Info: Full Code             Current Medications (03/21/2016):  This is the current hospital active medication list Current Facility-Administered Medications  Medication Dose Route Frequency Provider Last Rate Last Dose  . 0.9 %  sodium chloride infusion   Intravenous Continuous [name], MD 75 mL/hr at 03/21/16 0200    . acetaminophen (TYLENOL) tablet 650 mg  650 mg Oral Q6H PRN [name], MD       Or  . acetaminophen (TYLENOL) suppository 650 mg  650 mg Rectal Q6H PRN [name], MD      . aspirin EC tablet 81 mg  81 mg Oral Daily Mauricio Gerome Apley, MD   81 mg at 03/21/16 [id number]  . B-complex with vitamin C tablet 1 tablet  1 tablet Oral Daily Mauricio Gerome Apley, MD   1 tablet at 03/21/16 [phone]  . carvedilol (COREG) tablet 25 mg  25 mg Oral BID WC [name], MD   25 mg at 03/21/16 [id number]  . cholecalciferol (VITAMIN D) tablet 2,000 Units  2,000 Units Oral Daily Mauricio Gerome Apley, MD   2,000 Units at 03/21/16 [id number]  . citalopram (CELEXA) tablet 20 mg  20 mg Oral Daily Mauricio Gerome Apley, MD   20 mg at 03/21/16 [id number]  .  cyanocobalamin tablet 500 mcg  500 mcg Oral Daily Mauricio Gerome Apley, MD   500 mcg at 03/21/16 [id number]  . diphenhydrAMINE (BENADRYL) capsule 25 mg  25 mg Oral Q6H PRN Mariel Aloe, MD   25 mg at 03/17/16 2202  . DULoxetine (CYMBALTA) DR capsule 60 mg  60 mg Oral Daily Mauricio Gerome Apley, MD   60 mg at 03/21/16 [id number]  . ezetimibe (ZETIA) tablet 10 mg  10 mg Oral Daily [name], MD   10 mg at 03/21/16 [id number]  . fenofibrate tablet 160 mg  160 mg Oral Daily [name], MD   160 mg at 03/21/16 [id number]  . gabapentin (NEURONTIN) capsule 400 mg  400 mg Oral TID Mariel Aloe, MD   400 mg at 03/21/16 [id number]  . heparin injection 5,000 Units  5,000 Units Subcutaneous Q8H Mauricio Gerome Apley, MD   5,000 Units at 03/21/16 0547  . hydrALAZINE (APRESOLINE) tablet 25 mg  25 mg Oral Q8H Mauricio Gerome Apley, MD   25 mg at 03/21/16 0547  . HYDROcodone-acetaminophen (NORCO) 10-325 MG per tablet 1 tablet  1 tablet Oral Q6H PRN [name], MD   1 tablet at 03/20/16 2209  . insulin aspart (novoLOG) injection 0-20 Units  0-20 Units Subcutaneous Q4H [name], MD   4 Units at 03/21/16 0830  . insulin glargine (LANTUS) injection 30 Units  30 Units Subcutaneous Daily Mariel Aloe, MD   30 Units at 03/21/16 [phone]  . meloxicam (MOBIC) tablet 7.5 mg  7.5 mg Oral Daily [name], MD   7.5 mg at 03/21/16 [id number]  . ondansetron (ZOFRAN) tablet 4 mg  4 mg Oral Q6H PRN [name], MD       Or  . ondansetron [facility]) injection 4 mg  4 mg Intravenous Q6H PRN [name], MD      . pregabalin (LYRICA) capsule 200 mg  200 mg Oral BID [name], MD   200 mg at 03/21/16 [id number]  . protein supplement (PREMIER PROTEIN) liquid  11 oz Oral BID BM Mariel Aloe, MD   11 oz at 03/21/16 0924  . rOPINIRole (REQUIP XL) 24 hr tablet 4 mg  4 mg Oral QHS Mauricio Gerome Apley, MD   4 mg at 03/20/16 2209     Discharge Medications: Please see  discharge summary for a list of discharge medications.  Relevant Imaging Results:  Relevant Lab Results:   Additional Information ss # [id number]  Rayyan Orsborn, Randall An, LCSW

## 2016-03-21 NOTE — Discharge Summary (Signed)
Physician Discharge Summary  Melissa Myers [id number] DOB: [birth year] DOA: 03/15/2016  PCP: [name], MD  Admit date: 03/15/2016 Discharge date: 03/21/2016  Admitted From: Home Disposition:  SNF  Recommendations for Outpatient Follow-up:  1. Follow up with PCP in 1 week 2. Please obtain complete metabolic panel in one week to recheck liver tests and potassium 3. Follow up with Dr. Owens Loffler for endoscopic ultrasound with biopsy   Discharge Condition: Stable CODE STATUS: Full code  Brief/Interim Summary:  HPI written by Sander Radon, MD on 03/15/2016  Chief Complaint: Nausea and vomiting.   HPI: Melissa Myers is a 71 y.o. female with medical history significant of uncontrolled diabetes, who presents to the hospital with the chief complain of nausea and vomiting for the last 48 hours. Her nausea and vomiting has been persistent, with no improving or worsening factors, multiple times per day, associated with jaundice and diarrhea, but no fever or chills, no abdominal pain. Positive generalized pruritus. Patient had cholecystectomy in the past.  ED Course: Patient was found hyperbilirubinemic with abnormal abdominal CT suggesting obstructive physiology. Referred for admission and evaluation.   Hospital course:  Obstructive Jaundice Patient presented with hyperbilirubinemia, elevated transaminases, elevated alkaline phosphatase. MRI concerning for obstruction secondary to possible pancreatic adenocarcinoma. CA 19-9 elevated. ERCP performed with stent placement. Patient's liver function improved after stent. Jaundice improved. Patient to follow-up with Dr. Ardis Hughs as an outpatient for ultrasound endoscopy with biopsy.  Insulin dependent diabetes mellitus, uncontrolled Last A1C of 14.1 on 02/04/2016. Lantus 30 units daily given with sliding scale. Continue this regimen as an outpatient.  Essential hypertension Continued Coreg and hydralazine.  Spironolactone held on admission and restarted on discharge  Neuropathy Continued Lyrica and gabapentin which were renally dosed.  Dyslipidemia Continue fenofibrate and Zetia  Depression Continued Citalopram and Cymbalta  Vascular venous insuffiency No evidence of infection  Hypokalemia Resolved  Chronic diastolic heart failure Last EF of 65-70% on 11/30/2015. Grade 1 diastolic dysfunction. Compensated.   ?PAD Reduced DP pulse on left. Patient has a history ABIs in 2017 of 1.07 in right and 0.98 in left legs. Outpatient follow-up  Discharge Diagnoses:  Principal Problem:   Obstructive jaundice Active Problems:   Dyslipidemia   Essential hypertension   Chronic diastolic heart failure (HCC)   Venous stasis ulcers of both lower extremities (HCC)   Hyperlipidemia   DM (diabetes mellitus), type 2 with renal complications (HCC)   MDD (major depressive disorder), single episode, severe (HCC)   Diabetic peripheral neuropathy (HCC)   Jaundice   Hyperbilirubinemia   Abnormal CT of the abdomen   Bile duct stricture   Pancreatic mass    Discharge Instructions   Allergies as of 03/21/2016      Reactions   Cymbalta [duloxetine Hcl] Other (See Comments)   Restless leg syndrome   Statins Other (See Comments)   Statin drugs cause muscle pain / "muscle damage"--was told by MD not to take   Sulfa Antibiotics Diarrhea   Levemir [insulin Detemir] Itching   Morphine Other (See Comments)   GI upset and headaches   Zinc Swelling, Rash      Medication List    STOP taking these medications   LANTUS SOLOSTAR 100 UNIT/ML Solostar Pen Generic drug:  Insulin Glargine Replaced by:  insulin glargine 100 UNIT/ML injection   NOVOLOG FLEXPEN 100 UNIT/ML FlexPen Generic drug:  insulin aspart Replaced by:  insulin aspart 100 UNIT/ML injection   Potassium Chloride ER 20 MEQ Tbcr  TAKE these medications   aspirin EC 81 MG tablet Take 1 tablet (81 mg total) by mouth  daily.   B-complex with vitamin C tablet Take 1 tablet by mouth daily.   carvedilol 25 MG tablet Commonly known as:  COREG TAKE 1 TABLET BY MOUTH TWICE DAILY WITH FOOD   citalopram 20 MG tablet Commonly known as:  CELEXA TAKE 1 TABLET (20 MG TOTAL) BY MOUTH DAILY.   Coenzyme Q10 200 MG capsule Take 200 mg by mouth daily.   diphenhydrAMINE 25 mg capsule Commonly known as:  BENADRYL Take 1 capsule (25 mg total) by mouth every 8 (eight) hours as needed for itching.   DULoxetine 60 MG capsule Commonly known as:  CYMBALTA Take 1 capsule (60 mg total) by mouth daily.   ezetimibe 10 MG tablet Commonly known as:  ZETIA TAKE ONE TABLET BY MOUTH DAILY   fenofibrate 160 MG tablet TAKE 1 TABLET (160 MG TOTAL) BY MOUTH DAILY. MUST ESTABLISH WITH NEW PCP FOR ADDITIONAL REFILLS.   gabapentin 300 MG capsule Commonly known as:  NEURONTIN TAKE TWO CAPSULES BY MOUTH FOUR TIMES A DAY   glucose blood test strip Commonly known as:  ONETOUCH VERIO Use as instructed up to 4 times per day   hydrALAZINE 25 MG tablet Commonly known as:  APRESOLINE TAKE 1 TABLET (25 MG TOTAL) BY MOUTH 3 (THREE) TIMES DAILY.   HYDROcodone-acetaminophen 10-325 MG tablet Commonly known as:  NORCO Take 1 tablet by mouth every 6 (six) hours as needed.   insulin aspart 100 UNIT/ML injection Commonly known as:  novoLOG Inject 0-20 Units into the skin 3 (three) times daily with meals. . CBG 70 - 120: 0 units CBG 121 - 150: 3 units CBG 151 - 200: 4 units CBG 201 - 250: 7 units CBG 251 - 300: 11 units CBG 301 - 350: 15 units CBG 351 - 400: 20 units Replaces:  NOVOLOG FLEXPEN 100 UNIT/ML FlexPen   insulin glargine 100 UNIT/ML injection Commonly known as:  LANTUS Inject 0.3 mLs (30 Units total) into the skin daily. Start taking on:  03/22/2016 Replaces:  LANTUS SOLOSTAR 100 UNIT/ML Solostar Pen   Magnesium 500 MG Tabs Take 500 mg by mouth daily.   meclizine 25 MG tablet Commonly known as:  ANTIVERT Take 25 mg  by mouth 3 (three) times daily as needed for dizziness.   meloxicam 7.5 MG tablet Commonly known as:  MOBIC TAKE ONE TABLET BY MOUTH DAILY   metFORMIN 500 MG 24 hr tablet Commonly known as:  GLUCOPHAGE-XR Take 1,000 mg by mouth daily with breakfast.   metolazone 2.5 MG tablet Commonly known as:  ZAROXOLYN Take 1 tablet (2.5 mg total) by mouth 2 (two) times a week.   omega-3 acid ethyl esters 1 g capsule Commonly known as:  LOVAZA Take 2 capsules (2 g total) by mouth 2 (two) times daily.   ONETOUCH VERIO Soln Use to check sugars up to 4 times per day   pregabalin 200 MG capsule Commonly known as:  LYRICA Take 1 capsule (200 mg total) by mouth 2 (two) times daily.   protein supplement shake Liqd Commonly known as:  PREMIER PROTEIN Take 325 mLs (11 oz total) by mouth 2 (two) times daily between meals.   rOPINIRole 4 MG 24 hr tablet Commonly known as:  REQUIP XL TAKE 1 TABLET (4 MG TOTAL) BY MOUTH AT BEDTIME.   spironolactone 25 MG tablet Commonly known as:  ALDACTONE TAKE 1 TABLET BY MOUTH DAILY  torsemide 100 MG tablet Commonly known as:  DEMADEX Take 1 tablet (100 mg total) by mouth daily.   vitamin B-12 500 MCG tablet Commonly known as:  CYANOCOBALAMIN Take 500 mcg by mouth daily. Reported on 09/09/2015   Vitamin D3 2000 units capsule Take 1 capsule (2,000 Units total) by mouth daily.       Allergies  Allergen Reactions  . Cymbalta [Duloxetine Hcl] Other (See Comments)    Restless leg syndrome  . Statins Other (See Comments)    Statin drugs cause muscle pain / "muscle damage"--was told by MD not to take  . Sulfa Antibiotics Diarrhea  . Levemir [Insulin Detemir] Itching  . Morphine Other (See Comments)    GI upset and headaches  . Zinc Swelling and Rash    Consultations:  Gastroenterology   Procedures/Studies: Ct Abdomen Pelvis W Contrast  Result Date: 03/15/2016 CLINICAL DATA:  Nausea and generalized weakness. Jaundice. Diabetes.  Gastroesophageal reflux disease. EXAM: CT ABDOMEN AND PELVIS WITH CONTRAST TECHNIQUE: Multidetector CT imaging of the abdomen and pelvis was performed using the standard protocol following bolus administration of intravenous contrast. CONTRAST:  161mL ISOVUE-300 IOPAMIDOL (ISOVUE-300) INJECTION 61% COMPARISON:  07/12/2012 abdominal ultrasound. Abdominopelvic CT of 01/13/2008. FINDINGS: Lower chest: Lucencies of the lung bases are likely due to centrilobular emphysema. Mild cardiomegaly, without pericardial pleural effusion. Tiny hiatal hernia. Hepatobiliary: No focal liver lesion. Caudate lobe enlargement, without specific evidence of cirrhosis. Cholecystectomy. Intrahepatic ducts borderline dilated. The common duct measures upper normal, 10 mm in the porta hepatis on image 30/series 2. Tapers gradually in the region of the pancreatic head. No obstructive mass identified. No calcified stone. Pancreas: Pancreatic atrophy is moderate. The pancreatic duct is moderately dilated at 9 mm in the head on image 29/series 2. Suspect pancreas divisum, with the dorsal duct entering the duodenum on image 32/series 2. No evidence of acute pancreatitis. Spleen: Subcentimeter low-density splenic lesions are of doubtful clinical significance. Adrenals/Urinary Tract: Normal adrenal glands. Too small to characterize lesions in both kidneys. No hydronephrosis. Normal urinary bladder. Stomach/Bowel: Normal remainder of the stomach. Normal colon and terminal ileum. Normal small bowel. Vascular/Lymphatic: Aortic and branch vessel atherosclerosis. No abdominopelvic adenopathy. Reproductive: Hysterectomy.  No adnexal mass. Other: No significant free fluid. Mild pelvic floor laxity. Bilateral fat containing inguinal hernias. Musculoskeletal: Mild osteopenia. Remote seventh lateral right rib fracture. Lumbosacral spondylosis. IMPRESSION: 1. Cholecystectomy with borderline biliary duct dilatation. 2. Pancreatic atrophy with moderate pancreatic  duct dilatation, followed to the level of the duodenum. 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 Considerations include otherwise occult stricture, periampullary lesion, or main duct intraductal papillary mucinous neoplasm. Given the combination of borderline biliary duct dilatation, moderate pancreatic duct dilatation, and the clinical history of jaundice, consider further evaluation with ERCP. If the patient is not a good ERCP candidate, MRCP with and without contrast would be another option. 3.  No acute process in the abdomen or pelvis. 4.  Tiny hiatal hernia. 5.  Aortic atherosclerosis. Electronically Signed   By: Abigail Miyamoto M.D.   On: 03/15/2016 13:53   Mr 3d Recon At Scanner  Result Date: 03/16/2016 CLINICAL DATA:  Elevated LFTs with jaundice. EXAM: MRI ABDOMEN WITHOUT AND WITH CONTRAST (INCLUDING MRCP) TECHNIQUE: Multiplanar multisequence MR imaging of the abdomen was performed both before and after the administration of intravenous contrast. Heavily T2-weighted images of the biliary and pancreatic ducts were obtained, and three-dimensional MRCP images were rendered by post processing. CONTRAST:  58mL MULTIHANCE GADOBENATE DIMEGLUMINE 529 MG/ML IV SOLN COMPARISON:  CT scan 03/15/2016  FINDINGS: Note: Patient was not alert during the exam and could not reproducibly breath hold. As such, the study is markedly degraded by significant breathing motion artifact. Lower chest:  No evidence for pleural effusion. Hepatobiliary: No gross enhancing liver lesion evident although motion artifact could obscure tiny liver lesions. There is intrahepatic biliary duct dilatation. Common duct is not well seen secondary to the extreme motion artifact but probably measures up to about 16 mm diameter. There is abrupt termination of the common bile duct in the head of the pancreas. No evidence for intraductal stone although, again, assessment is limited by motion degradation. Pancreas: Pancreatic duct is diffusely dilated  measuring up to 11-2009 mm diameter in the head of the pancreas. Pancreatic parenchyma is atrophic in the body and tail of pancreas. In the uncinate process, 2.2 x 2.8 cm hypoenhancing lesion is identified in both the pancreatic and common bile ducts terminate at the cranial margin of this lesion. Spleen: 8 mm hypoenhancing lesion in the lateral spleen, as seen on previous CT scan. This has high signal intensity on T2 weighted imaging and is probably a tiny cyst. Adrenals/Urinary Tract: No adrenal nodule or mass. Small bilateral renal cysts are evident, but not well seen secondary to motion. There is no hydronephrosis in either kidney. Stomach/Bowel: Stomach is nondilated. No evidence for small bowel or colon dilatation within the visualized abdomen. Vascular/Lymphatic: No abdominal aortic aneurysm. No abdominal lymphadenopathy can be identified. Other: No intraperitoneal free fluid. Musculoskeletal: No abnormal marrow enhancement within the visualized bony anatomy. IMPRESSION: 1. Hypoenhancing lesion in the uncinate process of the pancreas, better demonstrated by MR than on the recent CT scan. The dilated main pancreatic duct and dilated common bile duct abruptly terminate at the cranial margin of this lesion. MR imaging features are concerning for pancreatic adenocarcinoma. ERCP with endoscopic ultrasound would likely prove helpful to further evaluate. 2. No definite metastatic disease in the liver. No peripancreatic lymphadenopathy is identified. Of note, this exam is limited by marked motion degradation and small liver lesions could be obscured. CT scan earlier today demonstrated no peripancreatic lymphadenopathy. Electronically Signed   By: Misty Stanley M.D.   On: 03/16/2016 22:25   Dg Ercp Biliary & Pancreatic Ducts  Result Date: 03/18/2016 CLINICAL DATA:  Stent placed EXAM: ERCP TECHNIQUE: Multiple spot images obtained with the fluoroscopic device and submitted for interpretation post-procedure.  FLUOROSCOPY TIME:  Fluoroscopy Time:  9 minutes and 24 seconds Radiation Exposure Index (if provided by the fluoroscopic device): Number of Acquired Spot Images: 4 COMPARISON:  None. FINDINGS: Cannulation of the common bile duct followed by contrast opacification of the biliary tree and common bile duct stent placement is documented. Contrast clears from the biliary tree after stent placement. IMPRESSION: See above. These images were submitted for radiologic interpretation only. Please see the procedural report for the amount of contrast and the fluoroscopy time utilized. Electronically Signed   By: Marybelle Killings M.D.   On: 03/18/2016 14:41   Mr Abdomen Mrcp Moise Boring Contast  Result Date: 03/16/2016 CLINICAL DATA:  Elevated LFTs with jaundice. EXAM: MRI ABDOMEN WITHOUT AND WITH CONTRAST (INCLUDING MRCP) TECHNIQUE: Multiplanar multisequence MR imaging of the abdomen was performed both before and after the administration of intravenous contrast. Heavily T2-weighted images of the biliary and pancreatic ducts were obtained, and three-dimensional MRCP images were rendered by post processing. CONTRAST:  52mL MULTIHANCE GADOBENATE DIMEGLUMINE 529 MG/ML IV SOLN COMPARISON:  CT scan 03/15/2016 FINDINGS: Note: Patient was not alert during the exam and  could not reproducibly breath hold. As such, the study is markedly degraded by significant breathing motion artifact. Lower chest:  No evidence for pleural effusion. Hepatobiliary: No gross enhancing liver lesion evident although motion artifact could obscure tiny liver lesions. There is intrahepatic biliary duct dilatation. Common duct is not well seen secondary to the extreme motion artifact but probably measures up to about 16 mm diameter. There is abrupt termination of the common bile duct in the head of the pancreas. No evidence for intraductal stone although, again, assessment is limited by motion degradation. Pancreas: Pancreatic duct is diffusely dilated measuring up to  11-2009 mm diameter in the head of the pancreas. Pancreatic parenchyma is atrophic in the body and tail of pancreas. In the uncinate process, 2.2 x 2.8 cm hypoenhancing lesion is identified in both the pancreatic and common bile ducts terminate at the cranial margin of this lesion. Spleen: 8 mm hypoenhancing lesion in the lateral spleen, as seen on previous CT scan. This has high signal intensity on T2 weighted imaging and is probably a tiny cyst. Adrenals/Urinary Tract: No adrenal nodule or mass. Small bilateral renal cysts are evident, but not well seen secondary to motion. There is no hydronephrosis in either kidney. Stomach/Bowel: Stomach is nondilated. No evidence for small bowel or colon dilatation within the visualized abdomen. Vascular/Lymphatic: No abdominal aortic aneurysm. No abdominal lymphadenopathy can be identified. Other: No intraperitoneal free fluid. Musculoskeletal: No abnormal marrow enhancement within the visualized bony anatomy. IMPRESSION: 1. Hypoenhancing lesion in the uncinate process of the pancreas, better demonstrated by MR than on the recent CT scan. The dilated main pancreatic duct and dilated common bile duct abruptly terminate at the cranial margin of this lesion. MR imaging features are concerning for pancreatic adenocarcinoma. ERCP with endoscopic ultrasound would likely prove helpful to further evaluate. 2. No definite metastatic disease in the liver. No peripancreatic lymphadenopathy is identified. Of note, this exam is limited by marked motion degradation and small liver lesions could be obscured. CT scan earlier today demonstrated no peripancreatic lymphadenopathy. Electronically Signed   By: Misty Stanley M.D.   On: 03/16/2016 22:25    ERCP (03/18/2016)  Impression:        1. Malignant-appearing distal bile duct stricture status post ERCP with sphincterotomy and plastic biliary stent placement 2. Guidewire finding false lumen with subsequent injection as described 3.  Status post cholecystectomy                           . Recommendation: 1. Clear liquid diet 2. Unasyn 1.5 g every 6 hours 3. Standard post ERCP observation. Indomethacin suppositories given 4. Trend LFTs 5. Will need endoscopic ultrasound with biopsy with Dr. Ardis Hughs outpatient.   Subjective: No nausea, vomiting, or abdominal pain.  Discharge Exam: Vitals:   03/20/16 2016 03/21/16 0507  BP: 136/62 (!) 149/60  Pulse: 65 62  Resp: 16 16  Temp: 98.7 F (37.1 C) 99.9 F (37.7 C)   Vitals:   03/20/16 1327 03/20/16 1349 03/20/16 2016 03/21/16 0507  BP: 135/60 (!) 133/59 136/62 (!) 149/60  Pulse: (!) 59  65 62  Resp: 16  16 16   Temp: 98.4 F (36.9 C)  98.7 F (37.1 C) 99.9 F (37.7 C)  TempSrc: Oral  Oral Oral  SpO2: 100%  93% 100%  Weight:      Height:        General exam: Appears calm and comfortable Eyes: scleral icterus Respiratory system: Clear to  auscultation. Respiratory effort normal. Cardiovascular system: S1 & S2 heard, RRR. 1/6 systolic murmur. Trace left DP pulse, 2+ on right. Gastrointestinal system: Abdomen is distended, soft and nontender. Normal bowel sounds heard. Central nervous system: Alert and oriented. No focal neurological deficits. Extremities: No edema. No calf tenderness Skin: Jaundiced but improved, erythematous rash on bilateral legs Psychiatry: Judgement and insight appear normal. Mood & affect appropriate.   The results of significant diagnostics from this hospitalization (including imaging, microbiology, ancillary and laboratory) are listed below for reference.     Microbiology: No results found for this or any previous visit (from the past 240 hour(s)).   Labs: BNP (last 3 results)  Recent Labs  11/19/15 1200 12/08/15 1600 01/04/16 1526  BNP 31.0 46.1 [id number]   Basic Metabolic Panel:  Recent Labs Lab 03/17/16 0418 03/18/16 0409 03/19/16 0359 03/20/16 0358 03/21/16 0343  NA 133* 132* 135 135 133*  K 3.5 3.9 4.3 4.1 4.5   CL 97* 96* 102 104 104  CO2 25 26 24 26 23   GLUCOSE 174* 183* 276* 163* 135*  BUN 47* 38* 29* 31* 21*  CREATININE 0.92 0.64 0.45 0.58 0.59  CALCIUM 8.6* 9.0 8.8* 8.6* 8.4*   Liver Function Tests:  Recent Labs Lab 03/16/16 2151 03/18/16 0409 03/19/16 0359 03/20/16 0358 03/21/16 0343  AST 654* 473* 299* 141* 75*  ALT 299* 274* 236* 169* 105*  ALKPHOS 457* 547* 583* 473* 369*  BILITOT 13.2* 17.2* 14.2* 5.9* 4.9*  PROT 5.9* 6.3* 5.8* 5.8* 5.3*  ALBUMIN 2.7* 2.4* 2.4* 2.4* 2.4*    Recent Labs Lab 03/15/16 1116  LIPASE 29    Recent Labs Lab 03/15/16 1116 03/17/16 2337  AMMONIA 49* 50*   CBC:  Recent Labs Lab 03/15/16 1116 03/16/16 0342 03/19/16 0359  WBC 6.3 6.2 5.6  NEUTROABS 3.4  --   --   HGB 10.3* 10.1* 10.7*  HCT 31.0* 30.5* 31.7*  MCV 81.4 79.6 81.1  PLT 216 209 248   Cardiac Enzymes: No results for input(s): CKTOTAL, CKMB, CKMBINDEX, TROPONINI in the last 168 hours. BNP: Invalid input(s): POCBNP CBG:  Recent Labs Lab 03/20/16 1620 03/20/16 2012 03/20/16 2359 03/21/16 0403 03/21/16 0800  GLUCAP 225* 176* 156* 129* 151*   D-Dimer No results for input(s): DDIMER in the last 72 hours. Hgb A1c No results for input(s): HGBA1C in the last 72 hours. Lipid Profile No results for input(s): CHOL, HDL, LDLCALC, TRIG, CHOLHDL, LDLDIRECT in the last 72 hours. Thyroid function studies No results for input(s): TSH, T4TOTAL, T3FREE, THYROIDAB in the last 72 hours.  Invalid input(s): FREET3 Anemia work up No results for input(s): VITAMINB12, FOLATE, FERRITIN, TIBC, IRON, RETICCTPCT in the last 72 hours. Urinalysis    Component Value Date/Time   COLORURINE YELLOW 03/15/2016 1059   APPEARANCEUR CLEAR 03/15/2016 1059   LABSPEC 1.010 03/15/2016 1059   PHURINE 6.0 03/15/2016 1059   GLUCOSEU >=500 (A) 03/15/2016 1059   GLUCOSEU NEGATIVE 11/04/2015 0956   HGBUR NEGATIVE 03/15/2016 1059   BILIRUBINUR NEGATIVE 03/15/2016 1059   BILIRUBINUR neg  08/17/2015 1547   KETONESUR NEGATIVE 03/15/2016 1059   PROTEINUR NEGATIVE 03/15/2016 1059   UROBILINOGEN 0.2 11/04/2015 0956   NITRITE NEGATIVE 03/15/2016 1059   LEUKOCYTESUR NEGATIVE 03/15/2016 1059   Sepsis Labs Invalid input(s): PROCALCITONIN,  WBC,  LACTICIDVEN Microbiology No results found for this or any previous visit (from the past 240 hour(s)).   Time coordinating discharge: Over 30 minutes  SIGNED:   Cordelia Poche, MD [facility] 03/21/2016, 11:18  AM Pager [phone]  If 7PM-7AM, please contact night-coverage [email or web address] Password [email or web address]

## 2016-03-21 NOTE — Evaluation (Signed)
Occupational Therapy Evaluation Patient Details Name: Melissa Myers MRN: [id number] DOB: 1945-05-22 Today's Date: 03/21/2016    History of Present Illness Pt admitted through ED 03/15/16 with weakness/jaundice.  Underwent ERCP with bilary stent placement 03/18/16   Clinical Impression   Pt with decline in function and safety with ADLs and ADL mobility with decreased strength, balance and endurance. Pt would benefit from acute OT services to address impairments to increase level of function and safety    Follow Up Recommendations  SNF    Equipment Recommendations  None recommended by OT;Other (comment) (TBD at next venue of care)    Recommendations for Other Services       Precautions / Restrictions Precautions Precautions: Fall Restrictions Weight Bearing Restrictions: No      Mobility Bed Mobility Overal bed mobility: Needs Assistance Bed Mobility: Supine to Sit     Supine to sit: Min assist     General bed mobility comments: increased time with use of pad to complete transition to EOB and physical assist to bring trunk to upright.   Transfers Overall transfer level: Needs assistance Equipment used: Rolling walker (2 wheeled) Transfers: Sit to/from Stand Sit to Stand: Min assist;Mod assist;+2 safety/equipment              Balance Overall balance assessment: Needs assistance Sitting-balance support: No upper extremity supported;Feet supported Sitting balance-Leahy Scale: Fair     Standing balance support: Bilateral upper extremity supported Standing balance-Leahy Scale: Poor                              ADL Overall ADL's : Needs assistance/impaired     Grooming: Wash/dry hands;Wash/dry face;Standing;Min guard   Upper Body Bathing: Set up;Sitting   Lower Body Bathing: Moderate assistance   Upper Body Dressing : Set up;Sitting   Lower Body Dressing: Moderate assistance;Maximal assistance   Toilet Transfer: Minimal  assistance;Moderate assistance;RW;Ambulation;Comfort height toilet;Grab bars   Toileting- Clothing Manipulation and Hygiene: Minimal assistance;Sit to/from stand       Functional mobility during ADLs: Minimal assistance;Moderate assistance       Vision Vision Assessment?: No apparent visual deficits              Pertinent Vitals/Pain Pain Assessment: No/denies pain Pain Intervention(s): Monitored during session     Hand Dominance Right   Extremity/Trunk Assessment Upper Extremity Assessment Upper Extremity Assessment: Generalized weakness   Lower Extremity Assessment Lower Extremity Assessment: Defer to PT evaluation       Communication Communication Communication: No difficulties   Cognition Arousal/Alertness: Awake/alert Behavior During Therapy: WFL for tasks assessed/performed Overall Cognitive Status: Within Functional Limits for tasks assessed                     General Comments   pt very pleasant and cooperative                 Home Living Family/patient expects to be discharged to:: Skilled nursing facility Living Arrangements: Spouse/significant other;Parent                               Additional Comments: oxygen      Prior Functioning/Environment Level of Independence: Needs assistance  Gait / Transfers Assistance Needed: Pt utilizes rollator and very ltd distance only ADL's / Homemaking Assistance Needed: Assist of spouse  OT Problem List: Decreased strength;Impaired balance (sitting and/or standing);Decreased activity tolerance;Decreased knowledge of use of DME or AE;Obesity   OT Treatment/Interventions: Self-care/ADL training;DME and/or AE instruction;Therapeutic activities;Patient/family education    OT Goals(Current goals can be found in the care plan section) Acute Rehab OT Goals Patient Stated Goal: get better and go home OT Goal Formulation: With patient/family Time For Goal Achievement:  03/21/16 Potential to Achieve Goals: Good ADL Goals Pt Will Perform Grooming: with supervision;with set-up;standing Pt Will Perform Lower Body Bathing: with min assist;sitting/lateral leans;sit to/from stand Pt Will Perform Lower Body Dressing: with min assist;sitting/lateral leans;sit to/from stand;with mod assist Pt Will Transfer to Toilet: with min assist;with min guard assist;ambulating Pt Will Perform Toileting - Clothing Manipulation and hygiene: with min guard assist;sit to/from stand  OT Frequency: Min 2X/week   Barriers to D/C: Decreased caregiver support                        End of Session Equipment Utilized During Treatment: Gait belt;Rolling walker  Activity Tolerance: Patient tolerated treatment well Patient left: in chair;with call bell/phone within reach;with chair alarm set;with family/visitor present   Time: [id number] OT Time Calculation (min): 23 min Charges:  OT General Charges $OT Visit: 1 Procedure OT Evaluation $OT Eval Moderate Complexity: 1 Procedure OT Treatments $Therapeutic Activity: 8-22 mins G-Codes:    Britt Bottom 03/21/2016, 1:38 PM

## 2016-03-21 NOTE — Progress Notes (Deleted)
Subjective:   Melissa Myers is a 71 y.o. female who presents for Medicare Annual (Subsequent) preventive examination.  Review of Systems:  No ROS.  Medicare Wellness Visit.    Sleep patterns: {SX; SLEEP PATTERNS:18802::"feels rested on waking","does not get up to void","gets up *** times nightly to void","*** hours nightly"}.   Home Safety/Smoke Alarms:   Living environment; residence and Firearm Safety: {Rehab home environment / accessibility:30080::"no firearms","firearms stored safely"}. Seat Belt Safety/Bike Helmet: Wears seat belt.   Counseling:   Eye Exam-  Dental-  Female:   Pap-       Mammo-  11/30/09: Negative - BI-RADS 1     Dexa scan- Last 11/30/09: Normal       CCS- Last 12/03/12: Pre-cancerous polyp removed. Recall 5 yrs per Dr.Brodie.    Objective:     Vitals: There were no vitals taken for this visit.  There is no height or weight on file to calculate BMI.   Tobacco History  Smoking Status  . Never Smoker  Smokeless Tobacco  . Never Used     Counseling given: Not Answered   Past Medical History:  Diagnosis Date  . Anemia   . Cellulitis    LOWER EXTREMITIES  . Chronic diarrhea    a/w nausea - felt related to IBS  . Deaf    left side only  . Diastolic CHF ([place])   . Disc degeneration, lumbar   . GERD (gastroesophageal reflux disease)   . Hyperlipidemia    hx rhabdo on statins  . Hypertension   . Neuropathy ([facility])    feet, toes and fingers  . On home oxygen therapy    uses oxygen 2 liters min per  at night and prn during day  . OSA (obstructive sleep apnea)    05/2009 sleep study - refuses CPAP  . Osteoarthritis   . RLS (restless legs syndrome)   . Shortness of breath    chronic  . Stasis dermatitis   . Type II or unspecified type diabetes mellitus without mention of complication, not stated as uncontrolled    insulin dep   Past Surgical History:  Procedure Laterality Date  . CHOLECYSTECTOMY  1997  . COLONOSCOPY N/A 12/03/2012    Procedure: COLONOSCOPY;  Surgeon: Lafayette Dragon, MD;  Location: [facility];  Service: Endoscopy;  Laterality: N/A;  . TONSILLECTOMY  1970  . TUBAL LIGATION  1980  . UMBILICAL HERNIA REPAIR  1995  . uterine polyp removal  2008   Family History  Problem Relation Age of Onset  . Heart disease Mother   . Heart attack Mother 65  . Heart disease Father   . Heart attack Father 38  . Heart disease      family history  . Stomach cancer Paternal Grandmother   . Lung cancer Paternal Grandfather   . CVA      several aunts  . Heart attack      several aunts and an uncle  . Colon cancer Neg Hx    History  Sexual Activity  . Sexual activity: No    Facility-Administered Encounter Medications as of 03/23/2016  Medication  . 0.9 %  sodium chloride infusion  . acetaminophen (TYLENOL) tablet 650 mg   Or  . acetaminophen (TYLENOL) suppository 650 mg  . aspirin EC tablet 81 mg  . B-complex with vitamin C tablet 1 tablet  . carvedilol (COREG) tablet 25 mg  . cholecalciferol (VITAMIN D) tablet 2,000 Units  . citalopram (CELEXA) tablet  20 mg  . cyanocobalamin tablet 500 mcg  . diphenhydrAMINE (BENADRYL) capsule 25 mg  . DULoxetine (CYMBALTA) DR capsule 60 mg  . ezetimibe (ZETIA) tablet 10 mg  . fenofibrate tablet 160 mg  . gabapentin (NEURONTIN) capsule 400 mg  . heparin injection 5,000 Units  . hydrALAZINE (APRESOLINE) tablet 25 mg  . HYDROcodone-acetaminophen (NORCO) 10-325 MG per tablet 1 tablet  . insulin aspart (novoLOG) injection 0-20 Units  . insulin glargine (LANTUS) injection 30 Units  . meloxicam (MOBIC) tablet 7.5 mg  . ondansetron (ZOFRAN) tablet 4 mg   Or  . ondansetron (ZOFRAN) injection 4 mg  . pregabalin (LYRICA) capsule 200 mg  . protein supplement (PREMIER PROTEIN) liquid  . rOPINIRole (REQUIP XL) 24 hr tablet 4 mg   Outpatient Encounter Prescriptions as of 03/23/2016  Medication Sig  . aspirin EC 81 MG tablet Take 1 tablet (81 mg total) by mouth daily.  . B  Complex-C (B-COMPLEX WITH VITAMIN C) tablet Take 1 tablet by mouth daily.  . Blood Glucose Calibration (ONETOUCH VERIO) SOLN Use to check sugars up to 4 times per day  . carvedilol (COREG) 25 MG tablet TAKE 1 TABLET BY MOUTH TWICE DAILY WITH FOOD  . Cholecalciferol (VITAMIN D3) 2000 units capsule Take 1 capsule (2,000 Units total) by mouth daily.  . citalopram (CELEXA) 20 MG tablet TAKE 1 TABLET (20 MG TOTAL) BY MOUTH DAILY.  [name] Coenzyme Q10 200 MG capsule Take 200 mg by mouth daily.  . DULoxetine (CYMBALTA) 60 MG capsule Take 1 capsule (60 mg total) by mouth daily.  [name] ezetimibe (ZETIA) 10 MG tablet TAKE ONE TABLET BY MOUTH DAILY  . fenofibrate 160 MG tablet TAKE 1 TABLET (160 MG TOTAL) BY MOUTH DAILY. MUST ESTABLISH WITH NEW PCP FOR ADDITIONAL REFILLS.  [name] gabapentin (NEURONTIN) 300 MG capsule TAKE TWO CAPSULES BY MOUTH FOUR TIMES A DAY  . glucose blood (ONETOUCH VERIO) test strip Use as instructed up to 4 times per day  . hydrALAZINE (APRESOLINE) 25 MG tablet TAKE 1 TABLET (25 MG TOTAL) BY MOUTH 3 (THREE) TIMES DAILY.  [name] HYDROcodone-acetaminophen (NORCO) 10-325 MG tablet Take 1 tablet by mouth every 6 (six) hours as needed.  . insulin aspart (NOVOLOG FLEXPEN) 100 UNIT/ML FlexPen Inject 16 Units into the skin 3 (three) times daily with meals.   [name] LANTUS SOLOSTAR 100 UNIT/ML Solostar Pen INJECT 50 UNITS UNDER THE SKIN EVERY EVENING AT 10:00 PM (Patient taking differently: 170 units in the morning)  . Magnesium 500 MG TABS Take 500 mg by mouth daily.  . meclizine (ANTIVERT) 25 MG tablet Take 25 mg by mouth 3 (three) times daily as needed for dizziness.  . meloxicam (MOBIC) 7.5 MG tablet TAKE ONE TABLET BY MOUTH DAILY  . metFORMIN (GLUCOPHAGE-XR) 500 MG 24 hr tablet Take 1,000 mg by mouth daily with breakfast.   . metolazone (ZAROXOLYN) 2.5 MG tablet Take 1 tablet (2.5 mg total) by mouth 2 (two) times a week.  . omega-3 acid ethyl esters (LOVAZA) 1 g capsule Take 2 capsules (2 g total) by mouth 2  (two) times daily.  . Potassium Chloride ER 20 MEQ TBCR Take 60 mEq by mouth 2 (two) times daily.  . pregabalin (LYRICA) 200 MG capsule Take 1 capsule (200 mg total) by mouth 2 (two) times daily.  [name] rOPINIRole (REQUIP XL) 4 MG 24 hr tablet TAKE 1 TABLET (4 MG TOTAL) BY MOUTH AT BEDTIME.  [name] spironolactone (ALDACTONE) 25 MG tablet TAKE 1 TABLET BY MOUTH DAILY  .  torsemide (DEMADEX) 100 MG tablet Take 1 tablet (100 mg total) by mouth 2 (two) times daily. (Patient taking differently: Take 100 mg by mouth daily. )  . vitamin B-12 (CYANOCOBALAMIN) 500 MCG tablet Take 500 mcg by mouth daily. Reported on 09/09/2015    Activities of Daily Living In your present state of health, do you have any difficulty performing the following activities: 03/15/2016 03/15/2016  Hearing? - Y  Vision? - N  Difficulty concentrating or making decisions? - N  Walking or climbing stairs? - Y  Dressing or bathing? - N  Doing errands, shopping? Y -  Conservation officer, nature and eating ? - -  Using the Toilet? - -  In the past six months, have you accidently leaked urine? - -  Do you have problems with loss of bowel control? - -  Managing your Medications? - -  Managing your Finances? - -  Housekeeping or managing your Housekeeping? - -  Some recent data might be hidden    Patient Care Team: [name], MD as PCP - General (Internal Medicine) Rigoberto Noel, MD as Consulting Physician (Pulmonary Disease) Larey Dresser, MD as Consulting Physician (Cardiology) Megan Salon, MD as Consulting Physician (Gynecology) Kristeen Miss, MD as Consulting Physician (Neurosurgery) Earlie Server, MD (Orthopedic Surgery) Almedia Balls, MD (Orthopedic Surgery) Lafayette Dragon, MD (Inactive) (Gastroenterology) Renato Shin, MD (Endocrinology) Darleen Crocker, MD (Ophthalmology)    Assessment:    Physical assessment deferred to PCP.   Exercise Activities and Dietary recommendations   Diet (meal preparation, eat out, water  intake, caffeinated beverages, dairy products, fruits and vegetables): {Desc; diets:16563} Breakfast: Lunch:  Dinner:      Goals    None     Fall Risk Fall Risk  11/17/2015 10/22/2015 05/15/2015 02/10/2015 12/02/2014  Falls in the past year? Yes Yes No No No  Number falls in past yr: 2 or more 2 or more - - -  Injury with Fall? Yes No - - -  Risk Factor Category  High Fall Risk High Fall Risk - - -  Risk for fall due to : History of fall(s);Impaired mobility;Medication side effect History of fall(s);Impaired balance/gait;Impaired mobility;Medication side effect;Other (Comment) - Impaired mobility Impaired balance/gait;Impaired mobility;Impaired vision;Medication side effect  Risk for fall due to (comments): - Neuropathy, restless legs and pain - discussed fall prevention strategies, active with home health physical therapy. -  Follow up Education provided;Falls prevention discussed Falls evaluation completed;Education provided;Falls prevention discussed - - -   Depression Screen PHQ 2/9 Scores 10/29/2015 10/22/2015 05/15/2015 02/10/2015  PHQ - 2 Score 0 0 0 0  PHQ- 9 Score - - - -     Cognitive Function        Immunization History  Administered Date(s) Administered  . Influenza Split 01/07/2011, 01/05/2012  . Influenza Whole 02/20/2009  . Influenza, High Dose Seasonal PF 10/23/2015  . Influenza,inj,Quad PF,36+ Mos 11/08/2013, 11/05/2014  . Influenza-Unspecified 12/22/2012  . Pneumococcal Conjugate-13 11/08/2013  . Pneumococcal Polysaccharide-23 02/21/2006, 01/16/2013  . Td 02/22/2004, 03/06/2014   Screening Tests Health Maintenance  Topic Date Due  . ZOSTAVAX  07/01/2005  . OPHTHALMOLOGY EXAM  01/21/2015  . URINE MICROALBUMIN  07/08/2015  . HEMOGLOBIN A1C  08/04/2016  . FOOT EXAM  02/03/2017  . COLONOSCOPY  12/04/2022  . TETANUS/TDAP  03/06/2024  . INFLUENZA VACCINE  Completed  . DEXA SCAN  Completed  . Hepatitis C Screening  Completed  . PNA vac Low Risk Adult   Completed  Plan:   *** During the course of the visit the patient was educated and counseled about the following appropriate screening and preventive services:   Vaccines to include Pneumoccal, Influenza, Hepatitis B, Td, Zostavax, HCV  Electrocardiogram  Cardiovascular Disease  Colorectal cancer screening  Bone density screening  Diabetes screening  Glaucoma screening  Mammography/PAP  Nutrition counseling   Patient Instructions (the written plan) was given to the patient.   Shela Nevin, [place]  03/21/2016

## 2016-03-22 ENCOUNTER — Telehealth: Payer: Self-pay | Admitting: *Deleted

## 2016-03-22 NOTE — Telephone Encounter (Signed)
Pt was on TCM list admitted for N/V sxs. Was found to have hyperbilirubinemic w/abnormal abdominal CT. Pt was d/c 1/29, and sent to SNF...Melissa Myers

## 2016-03-23 ENCOUNTER — Ambulatory Visit: Payer: PPO

## 2016-03-23 ENCOUNTER — Telehealth: Payer: Self-pay | Admitting: Internal Medicine

## 2016-03-23 NOTE — Telephone Encounter (Signed)
States patient was in hospital and discharged to [facility].  Patients spouse did call stating that patient did not like the facility and has left.  Wanted to let Sharlet Salina know that she will make home visit today.

## 2016-03-23 NOTE — Telephone Encounter (Signed)
Noted  

## 2016-03-24 ENCOUNTER — Telehealth: Payer: Self-pay | Admitting: Internal Medicine

## 2016-03-24 ENCOUNTER — Telehealth: Payer: Self-pay

## 2016-03-24 ENCOUNTER — Telehealth (HOSPITAL_COMMUNITY): Payer: Self-pay | Admitting: *Deleted

## 2016-03-24 ENCOUNTER — Other Ambulatory Visit: Payer: Self-pay

## 2016-03-24 DIAGNOSIS — R7989 Other specified abnormal findings of blood chemistry: Secondary | ICD-10-CM

## 2016-03-24 DIAGNOSIS — R945 Abnormal results of liver function studies: Principal | ICD-10-CM

## 2016-03-24 NOTE — Telephone Encounter (Signed)
Melissa Myers with [facility] called pts weight up from 235lbs before entering the hospital to 255lbs yesterday patient was discharged from the hospital on Monday. Before admission patient was on Torsemide 100mg  bid during discharge it was lowered to daily.  Patient will get CMET Monday. Per Melissa Myers ok to go back to Torsemide 100mg  bid. Melissa Myers aware and agreeable.

## 2016-03-24 NOTE — Telephone Encounter (Signed)
Needs hospital follow up and labs can be done then if needed.

## 2016-03-24 NOTE — Telephone Encounter (Signed)
Yes. Needs LFTs tomorrow or early next week

## 2016-03-24 NOTE — Telephone Encounter (Signed)
Called [name] back and stated that she needs hospital follow to do labs and so she is going to make sure she calls in for an appointment

## 2016-03-24 NOTE — Telephone Encounter (Signed)
-----   Message from Irene Shipper, MD sent at 03/20/2016  5:16 PM EST ----- Regarding: Liver tests and EUS Vaughan Basta, This patient presented to the hospital with obstructive jaundice. 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 Biliary stent placed. She is going to inpatient rehabilitation tomorrow for an undefined amount of time. She needs repeat liver test in 1 week. She should also get on Dr. Ardis Hughs scheduled for endoscopic ultrasound with biopsy. Family aware of plans as well. Thanks, jp

## 2016-03-24 NOTE — Telephone Encounter (Signed)
Pt has labs drawn on 03/21/16. Pt went to [facility] and stayed 2 days then went home, states she did not like it there. Reports she is not having therapy at home. Does pt need repeat labs this week or next week. Please advise.

## 2016-03-24 NOTE — Telephone Encounter (Signed)
Spoke with pt and she is aware, orders in epic. 

## 2016-03-24 NOTE — Telephone Encounter (Signed)
Seen patient yesterday.  States patient is suppose to have a one week hospital fu with labs.  If patient can not come in or get in, can do a CMP. Also states patient would like home health for PT. Patient states she was switched from Gabapentin to Lyrica before hospital fu.  Patient states she was taking both in the hospital [facility]).  Would like to know if this if ok for patient to do?

## 2016-03-27 ENCOUNTER — Emergency Department (HOSPITAL_COMMUNITY): Payer: PPO

## 2016-03-27 ENCOUNTER — Encounter (HOSPITAL_COMMUNITY): Payer: Self-pay | Admitting: Emergency Medicine

## 2016-03-27 ENCOUNTER — Emergency Department (HOSPITAL_COMMUNITY)
Admission: EM | Admit: 2016-03-27 | Discharge: 2016-03-27 | Disposition: A | Discharge status: Home / Self Care | Payer: PPO | Attending: Emergency Medicine | Admitting: Emergency Medicine

## 2016-03-27 DIAGNOSIS — Z794 Long term (current) use of insulin: Secondary | ICD-10-CM | POA: Diagnosis not present

## 2016-03-27 DIAGNOSIS — Z7982 Long term (current) use of aspirin: Secondary | ICD-10-CM | POA: Diagnosis not present

## 2016-03-27 DIAGNOSIS — I5032 Chronic diastolic (congestive) heart failure: Secondary | ICD-10-CM | POA: Insufficient documentation

## 2016-03-27 DIAGNOSIS — Z79899 Other long term (current) drug therapy: Secondary | ICD-10-CM | POA: Insufficient documentation

## 2016-03-27 DIAGNOSIS — R531 Weakness: Secondary | ICD-10-CM | POA: Diagnosis not present

## 2016-03-27 DIAGNOSIS — R509 Fever, unspecified: Secondary | ICD-10-CM | POA: Diagnosis not present

## 2016-03-27 DIAGNOSIS — R4182 Altered mental status, unspecified: Secondary | ICD-10-CM | POA: Diagnosis not present

## 2016-03-27 DIAGNOSIS — I110 Hypertensive heart disease with heart failure: Secondary | ICD-10-CM | POA: Diagnosis not present

## 2016-03-27 DIAGNOSIS — J09X2 Influenza due to identified novel influenza A virus with other respiratory manifestations: Secondary | ICD-10-CM | POA: Insufficient documentation

## 2016-03-27 DIAGNOSIS — E1140 Type 2 diabetes mellitus with diabetic neuropathy, unspecified: Secondary | ICD-10-CM | POA: Insufficient documentation

## 2016-03-27 DIAGNOSIS — J101 Influenza due to other identified influenza virus with other respiratory manifestations: Secondary | ICD-10-CM

## 2016-03-27 DIAGNOSIS — R1110 Vomiting, unspecified: Secondary | ICD-10-CM | POA: Diagnosis not present

## 2016-03-27 DIAGNOSIS — J111 Influenza due to unidentified influenza virus with other respiratory manifestations: Secondary | ICD-10-CM | POA: Diagnosis not present

## 2016-03-27 LAB — CBC WITH DIFFERENTIAL/PLATELET
BASOS ABS: 0.1 10*3/uL (ref 0.0–0.1)
BASOS PCT: 1 %
Eosinophils Absolute: 0.1 10*3/uL (ref 0.0–0.7)
Eosinophils Relative: 2 %
HEMATOCRIT: 29.4 % — AB (ref 36.0–46.0)
HEMOGLOBIN: 9.4 g/dL — AB (ref 12.0–15.0)
LYMPHS PCT: 31 %
Lymphs Abs: 1.7 10*3/uL (ref 0.7–4.0)
MCH: 26.6 pg (ref 26.0–34.0)
MCHC: 32 g/dL (ref 30.0–36.0)
MCV: 83.3 fL (ref 78.0–100.0)
Monocytes Absolute: 0.8 10*3/uL (ref 0.1–1.0)
Monocytes Relative: 14 %
NEUTROS ABS: 2.9 10*3/uL (ref 1.7–7.7)
NEUTROS PCT: 52 %
Platelets: 350 10*3/uL (ref 150–400)
RBC: 3.53 MIL/uL — AB (ref 3.87–5.11)
RDW: 17.5 % — AB (ref 11.5–15.5)
WBC: 5.6 10*3/uL (ref 4.0–10.5)

## 2016-03-27 LAB — COMPREHENSIVE METABOLIC PANEL
ALBUMIN: 2.8 g/dL — AB (ref 3.5–5.0)
ALT: 33 U/L (ref 14–54)
AST: 56 U/L — AB (ref 15–41)
Alkaline Phosphatase: 174 U/L — ABNORMAL HIGH (ref 38–126)
Anion gap: 11 (ref 5–15)
BILIRUBIN TOTAL: 2.7 mg/dL — AB (ref 0.3–1.2)
BUN: 23 mg/dL — AB (ref 6–20)
CO2: 32 mmol/L (ref 22–32)
CREATININE: 1.25 mg/dL — AB (ref 0.44–1.00)
Calcium: 8.7 mg/dL — ABNORMAL LOW (ref 8.9–10.3)
Chloride: 92 mmol/L — ABNORMAL LOW (ref 101–111)
GFR calc Af Amer: 49 mL/min — ABNORMAL LOW (ref >60)
GFR calc non Af Amer: 43 mL/min — ABNORMAL LOW (ref >60)
GLUCOSE: 266 mg/dL — AB (ref 65–99)
POTASSIUM: 3.4 mmol/L — AB (ref 3.5–5.1)
Sodium: 135 mmol/L (ref 135–145)
TOTAL PROTEIN: 7.1 g/dL (ref 6.5–8.1)

## 2016-03-27 LAB — URINALYSIS, ROUTINE W REFLEX MICROSCOPIC
Bilirubin Urine: NEGATIVE
Glucose, UA: 150 mg/dL — AB
Hgb urine dipstick: NEGATIVE
KETONES UR: NEGATIVE mg/dL
LEUKOCYTES UA: NEGATIVE
NITRITE: NEGATIVE
PH: 7 (ref 5.0–8.0)
PROTEIN: NEGATIVE mg/dL
Specific Gravity, Urine: 1.012 (ref 1.005–1.030)

## 2016-03-27 LAB — INFLUENZA PANEL BY PCR (TYPE A & B)
INFLBPCR: NEGATIVE
Influenza A By PCR: POSITIVE — AB

## 2016-03-27 LAB — BRAIN NATRIURETIC PEPTIDE: B NATRIURETIC PEPTIDE 5: 46.1 pg/mL (ref 0.0–100.0)

## 2016-03-27 LAB — LIPASE, BLOOD: Lipase: 42 U/L (ref 11–51)

## 2016-03-27 LAB — I-STAT CG4 LACTIC ACID, ED: Lactic Acid, Venous: 1.27 mmol/L (ref 0.5–1.9)

## 2016-03-27 MED ORDER — ACETAMINOPHEN 500 MG PO TABS
1000.0000 mg | ORAL_TABLET | Freq: Once | ORAL | Status: AC
  Administered 2016-03-27: 1000 mg via ORAL
  Filled 2016-03-27: qty 2

## 2016-03-27 MED ORDER — BENZONATATE 100 MG PO CAPS: 200.0000 mg | ORAL_CAPSULE | Freq: Two times a day (BID) | ORAL | 0 refills | Status: DC | PRN

## 2016-03-27 MED ORDER — OSELTAMIVIR PHOSPHATE 75 MG PO CAPS: 75.0000 mg | ORAL_CAPSULE | Freq: Two times a day (BID) | ORAL | 0 refills | Status: DC

## 2016-03-27 MED ORDER — OXYMETAZOLINE HCL 0.05 % NA SOLN: 1.0000 | Freq: Two times a day (BID) | NASAL | 0 refills | Status: DC

## 2016-03-27 MED ORDER — SODIUM CHLORIDE 0.9 % IV BOLUS (SEPSIS)
500.0000 mL | Freq: Once | INTRAVENOUS | Status: AC
  Administered 2016-03-27: 500 mL via INTRAVENOUS

## 2016-03-27 NOTE — ED Triage Notes (Signed)
Pt c/o flu like symptoms, pt with mild confusion, vomiting, nausea, pt denies pain.

## 2016-03-27 NOTE — Discharge Instructions (Signed)
Take your medications as prescribed. I also recommend taking Tylenol and ibuprofen as prescribed over-the-counter, alternating between doses every 3-4 hours. Continue drinking fluids at home to remain hydrated. I recommend eating a bland diet for the next few days and taper symptoms have improved. Follow-up with your primary care provider in the next 2-3 days if symptoms have not improved. I also recommend fine up with your oncologist at your scheduled appointment tomorrow. Return to the emergency department if symptoms worsen or new onset of headache, neck stiffness, difficulty breathing, coughing up blood, chest pain, abdominal pain, vomiting, unable to keep fluids down.

## 2016-03-27 NOTE — ED Notes (Signed)
Bed: WA03 Expected date: 03/27/16 Expected time: 11:38 AM Means of arrival: Ambulance Comments: 71 yo weakness, flu symptoms

## 2016-03-27 NOTE — ED Provider Notes (Signed)
[facility] DEPT Provider Note   CSN: [id number] Arrival date & time: 03/27/16  1142     History   Chief Complaint Chief Complaint  Patient presents with  . Weakness  . Altered Mental Status  . Fever    HPI Melissa Myers is a 71 y.o. female.  HPI   Patient is a 71 year old female with history of diabetes, HTN, HLD, CHF who presents the ED via EMS from home with complaint of weakness, onset yesterday morning. Patient reports over the past 2 days she has had progressively worsening generalized weakness with associated chills, body aches, sore throat, nasal congestion, rhinorrhea, nonproductive cough, mild wheezing and nausea. Patient reports her husband has also been sick with similar symptoms over the past few days. She denies taking any medications for her symptoms. Denies any other known sick contacts. Denies fever, headache, lightheadedness, hemoptysis, shortness of breath, chest pain, abdominal pain, vomiting, diarrhea, urinary symptoms, rash. Patient reports having chronic leg swelling which is unchanged. She reports wearing 2 L of oxygen at night PRN. Patient notes she was seen in the hospital last week when she was having abdominal pain resulting in her being diagnosed with pancreatic cancer. She notes she has a follow-up appointment with her oncologist tomorrow.  PCP- Dr. Sharlet Salina Oncologist- Dr. Ardis Hughs  Past Medical History:  Diagnosis Date  . Anemia   . Cellulitis    LOWER EXTREMITIES  . Chronic diarrhea    a/w nausea - felt related to IBS  . Deaf    left side only  . Diastolic CHF ([place])   . Disc degeneration, lumbar   . GERD (gastroesophageal reflux disease)   . Hyperlipidemia    hx rhabdo on statins  . Hypertension   . Neuropathy (HCC)    feet, toes and fingers  . On home oxygen therapy    uses oxygen 2 liters min per [place] at night and prn during day  . OSA (obstructive sleep apnea)    05/2009 sleep study - refuses CPAP  . Osteoarthritis   . RLS  (restless legs syndrome)   . Shortness of breath    chronic  . Stasis dermatitis   . Type II or unspecified type diabetes mellitus without mention of complication, not stated as uncontrolled    insulin dep    Patient Active Problem List   Diagnosis Date Noted  . Pancreatic mass   . Bile duct stricture   . Obstructive jaundice   . Abnormal CT of the abdomen   . Jaundice 03/15/2016  . Hyperbilirubinemia 03/15/2016  . Diabetic peripheral neuropathy ([place]) 02/23/2016  . History of fall 10/22/2015  . Bradycardia 10/09/2015  . Left foot pain 07/10/2015  . Murmur, cardiac 07/02/2015  . Chronic pain syndrome 05/05/2015  . MDD (major depressive disorder), single episode, severe ([place]) 05/05/2015  . Chronic anemia 04/14/2015  . DM (diabetes mellitus), type 2 with renal complications ([place]) 54/65/0354  . Rectal bleeding 03/13/2015  . Hyperlipidemia 12/23/2014  . Deficiency anemia 07/26/2012  . Venous stasis ulcers of both lower extremities ([place]) 01/04/2011  . INSOMNIA 08/03/2009  . Chronic diastolic heart failure ([place]) 06/01/2009  . Vitamin D deficiency 05/12/2009  . Sleep apnea 04/29/2009  . Dyslipidemia 04/20/2009  . Morbid obesity ([place]) 04/20/2009  . RESTLESS LEGS SYNDROME 04/20/2009  . Peripheral neuropathy ([place]) 04/20/2009  . Essential hypertension 04/20/2009  . Low back pain 04/20/2009    Past Surgical History:  Procedure Laterality Date  . CHOLECYSTECTOMY  1997  . COLONOSCOPY N/A 12/03/2012  Procedure: COLONOSCOPY;  Surgeon: [place] Dragon, MD;  Location: [facility] ENDOSCOPY;  Service: Endoscopy;  Laterality: N/A;  . ERCP N/A 03/18/2016   Procedure: ENDOSCOPIC RETROGRADE CHOLANGIOPANCREATOGRAPHY (ERCP);  Surgeon: Irene Shipper, MD;  Location: [facility];  Service: Endoscopy;  Laterality: N/A;  . TONSILLECTOMY  1970  . TUBAL LIGATION  1980  . UMBILICAL HERNIA REPAIR  1995  . uterine polyp removal  2008    OB History    No data available       Home Medications    Prior  to Admission medications   Medication Sig Start Date End Date Taking? Authorizing Provider  aspirin EC 81 MG tablet Take 1 tablet (81 mg total) by mouth daily. 07/08/14   Rowe Clack, MD  B Complex-C (B-COMPLEX WITH VITAMIN C) tablet Take 1 tablet by mouth daily.    Historical Provider, MD  benzonatate (TESSALON) 100 MG capsule Take 2 capsules (200 mg total) by mouth 2 (two) times daily as needed for cough. 03/27/16   Nona Dell, PA-C  Blood Glucose Calibration (ONETOUCH VERIO) SOLN Use to check sugars up to 4 times per day 10/23/15   [name], MD  carvedilol (COREG) 25 MG tablet TAKE 1 TABLET BY MOUTH TWICE DAILY WITH FOOD 01/20/16   Jolaine Artist, MD  Cholecalciferol (VITAMIN D3) 2000 units capsule Take 1 capsule (2,000 Units total) by mouth daily. 06/17/15   Janith Lima, MD  citalopram (CELEXA) 20 MG tablet TAKE 1 TABLET (20 MG TOTAL) BY MOUTH DAILY. 01/27/16   [name], MD  Coenzyme Q10 200 MG capsule Take 200 mg by mouth daily.    Historical Provider, MD  diphenhydrAMINE (BENADRYL) 25 mg capsule Take 1 capsule (25 mg total) by mouth every 8 (eight) hours as needed for itching. 03/21/16   Mariel Aloe, MD  DULoxetine (CYMBALTA) 60 MG capsule Take 1 capsule (60 mg total) by mouth daily. 09/25/15   [name], MD  ezetimibe (ZETIA) 10 MG tablet TAKE ONE TABLET BY MOUTH DAILY 02/03/16   [name], MD  fenofibrate 160 MG tablet TAKE 1 TABLET (160 MG TOTAL) BY MOUTH DAILY. MUST ESTABLISH WITH NEW PCP FOR ADDITIONAL REFILLS. 02/25/15   [name], MD  gabapentin (NEURONTIN) 300 MG capsule TAKE TWO CAPSULES BY MOUTH FOUR TIMES A DAY 01/19/16   [name], MD  glucose blood (ONETOUCH VERIO) test strip Use as instructed up to 4 times per day 10/23/15   [name], MD  hydrALAZINE (APRESOLINE) 25 MG tablet TAKE 1 TABLET (25 MG TOTAL) BY MOUTH 3 (THREE) TIMES DAILY. 02/16/16   Jolaine Artist, MD    HYDROcodone-acetaminophen (NORCO) 10-325 MG tablet Take 1 tablet by mouth every 6 (six) hours as needed. 03/21/16   Mariel Aloe, MD  insulin aspart (NOVOLOG) 100 UNIT/ML injection Inject 0-20 Units into the skin 3 (three) times daily with meals. . CBG 70 - 120: 0 units CBG 121 - 150: 3 units CBG 151 - 200: 4 units CBG 201 - 250: 7 units CBG 251 - 300: 11 units CBG 301 - 350: 15 units CBG 351 - 400: 20 units 03/21/16   Mariel Aloe, MD  insulin glargine (LANTUS) 100 UNIT/ML injection Inject 0.3 mLs (30 Units total) into the skin daily. 03/22/16   Mariel Aloe, MD  Magnesium 500 MG TABS Take 500 mg by mouth daily.    Historical Provider, MD  meclizine (ANTIVERT) 25 MG tablet  Take 25 mg by mouth 3 (three) times daily as needed for dizziness.    Historical Provider, MD  meloxicam (MOBIC) 7.5 MG tablet TAKE ONE TABLET BY MOUTH DAILY 03/11/16   [name], MD  metFORMIN (GLUCOPHAGE-XR) 500 MG 24 hr tablet Take 1,000 mg by mouth daily with breakfast.     Historical Provider, MD  metolazone (ZAROXOLYN) 2.5 MG tablet Take 1 tablet (2.5 mg total) by mouth 2 (two) times a week. 12/17/15   Mikaiah Friar, PA-C  omega-3 acid ethyl esters (LOVAZA) 1 g capsule Take 2 capsules (2 g total) by mouth 2 (two) times daily. 07/06/15   Larey Dresser, MD  oseltamivir (TAMIFLU) 75 MG capsule Take 1 capsule (75 mg total) by mouth every 12 (twelve) hours. 03/27/16   Nona Dell, PA-C  oxymetazoline (AFRIN NASAL SPRAY) 0.05 % nasal spray Place 1 spray into both nostrils 2 (two) times daily. Spray once into each nostril twice daily for up to the next 3 days. Do not use for more than 3 days to prevent rebound rhinorrhea. 03/27/16   Nona Dell, PA-C  pregabalin (LYRICA) 200 MG capsule Take 1 capsule (200 mg total) by mouth 2 (two) times daily. 02/23/16   [name], MD  protein supplement shake (PREMIER PROTEIN) LIQD Take 325 mLs (11 oz total) by mouth 2 (two) times daily  between meals. 03/21/16   Mariel Aloe, MD  rOPINIRole (REQUIP XL) 4 MG 24 hr tablet TAKE 1 TABLET (4 MG TOTAL) BY MOUTH AT BEDTIME. 02/23/16   [name], MD  spironolactone (ALDACTONE) 25 MG tablet TAKE 1 TABLET BY MOUTH DAILY 09/21/15   Jolaine Artist, MD  torsemide (DEMADEX) 100 MG tablet Take 1 tablet (100 mg total) by mouth daily. 03/21/16   Mariel Aloe, MD  vitamin B-12 (CYANOCOBALAMIN) 500 MCG tablet Take 500 mcg by mouth daily. Reported on 09/09/2015    Historical Provider, MD    Family History Family History  Problem Relation Age of Onset  . Heart disease Mother   . Heart attack Mother 22  . Heart disease Father   . Heart attack Father 46  . Heart disease      family history  . Stomach cancer Paternal Grandmother   . Lung cancer Paternal Grandfather   . CVA      several aunts  . Heart attack      several aunts and an uncle  . Colon cancer Neg Hx     Social History Social History  Substance Use Topics  . Smoking status: Never Smoker  . Smokeless tobacco: Never Used  . Alcohol use No     Allergies   Cymbalta [duloxetine hcl]; Statins; Sulfa antibiotics; Levemir [insulin detemir]; Morphine; and Zinc   Review of Systems Review of Systems  Constitutional: Positive for appetite change (decreased) and chills.  HENT: Positive for congestion, rhinorrhea and sore throat.   Respiratory: Positive for cough and wheezing.   Gastrointestinal: Positive for nausea.  Neurological: Positive for weakness.  All other systems reviewed and are negative.    Physical Exam Updated Vital Signs BP 122/62   Pulse 61   Temp 98.5 F (36.9 C) (Oral)   Resp 18   SpO2 98%   Physical Exam  Constitutional: She is oriented to person, place, and time. She appears well-developed and well-nourished.  HENT:  Head: Normocephalic and atraumatic.  Mouth/Throat: Uvula is midline, oropharynx is clear and moist and mucous membranes are normal. No  oropharyngeal exudate, posterior  oropharyngeal edema, posterior oropharyngeal erythema or tonsillar abscesses. No tonsillar exudate.  Eyes: Conjunctivae and EOM are normal. Right eye exhibits no discharge. Left eye exhibits no discharge. No scleral icterus.  Neck: Normal range of motion. Neck supple.  Cardiovascular: Normal rate, regular rhythm and intact distal pulses.   Murmur heard. Pulmonary/Chest: Effort normal. No respiratory distress. She has wheezes. She has no rales. She exhibits no tenderness.  Mild end-expiratory rales noted in bibasilar lobes  Abdominal: Soft. Bowel sounds are normal. She exhibits no distension and no mass. There is no tenderness. There is no rebound and no guarding. No hernia.  Musculoskeletal: She exhibits edema.  1+ pitting edema noted to BLE  Lymphadenopathy:    She has no cervical adenopathy.  Neurological: She is alert and oriented to person, place, and time.  Skin: Skin is warm and dry. She is not diaphoretic.  Mild jaundice noted to pt's face  Nursing note and vitals reviewed.    ED Treatments / Results  Labs (all labs ordered are listed, but only abnormal results are displayed) Labs Reviewed  CBC WITH DIFFERENTIAL/PLATELET - Abnormal; Notable for the following:       Result Value   RBC 3.53 (*)    Hemoglobin 9.4 (*)    HCT 29.4 (*)    RDW 17.5 (*)    All other components within normal limits  COMPREHENSIVE METABOLIC PANEL - Abnormal; Notable for the following:    Potassium 3.4 (*)    Chloride 92 (*)    Glucose, Bld 266 (*)    BUN 23 (*)    Creatinine, Ser 1.25 (*)    Calcium 8.7 (*)    Albumin 2.8 (*)    AST 56 (*)    Alkaline Phosphatase 174 (*)    Total Bilirubin 2.7 (*)    GFR calc non Af Amer 43 (*)    GFR calc Af Amer 49 (*)    All other components within normal limits  INFLUENZA PANEL BY PCR (TYPE A & B) - Abnormal; Notable for the following:    Influenza A By PCR POSITIVE (*)    All other components within normal limits  URINALYSIS, ROUTINE W REFLEX  MICROSCOPIC - Abnormal; Notable for the following:    Glucose, UA 150 (*)    All other components within normal limits  LIPASE, BLOOD  BRAIN NATRIURETIC PEPTIDE  I-STAT CG4 LACTIC ACID, ED    EKG  EKG Interpretation  Date/Time:  Sunday March 27 2016 12:35:41 EST Ventricular Rate:  55 PR Interval:    QRS Duration: 148 QT Interval:  492 QTC Calculation: 471 R Axis:   -119 Text Interpretation:  Sinus rhythm Right bundle branch block No significant change since last tracing Confirmed by KNAPP  MD-J, JON ([id number]) on 03/27/2016 1:47:13 PM       Radiology Dg Chest 2 View  Result Date: 03/27/2016 CLINICAL DATA:  Flu-like symptoms, mild confusion, vomiting, nausea. EXAM: CHEST  2 VIEW COMPARISON:  Chest x-rays dated 10/09/2015, 08/25/2015 and 11/16/2014. FINDINGS: Cardiomegaly is stable. There is mild central pulmonary vascular congestion, without evidence of overt alveolar pulmonary edema. No confluent opacity to suggest a developing pneumonia. No pleural effusion or pneumothorax seen. Mild degenerative spurring noted throughout the thoracic spine. No acute or suspicious osseous finding. IMPRESSION: 1. Cardiomegaly with mild pulmonary vascular congestion, similar to multiple previous examinations, likely chronic mild CHF/volume overload. No overt alveolar pulmonary edema. 2. No evidence of pneumonia. Electronically Signed   By: Cherlynn Kaiser  Enriqueta Shutter M.D.   On: 03/27/2016 13:13    Procedures Procedures (including critical care time)  Medications Ordered in ED Medications  sodium chloride 0.9 % bolus 500 mL (0 mLs Intravenous Stopped 03/27/16 1913)  acetaminophen (TYLENOL) tablet 1,000 mg (1,000 mg Oral Given 03/27/16 1537)     Initial Impression / Assessment and Plan / ED Course  I have reviewed the triage vital signs and the nursing notes.  Pertinent labs & imaging results that were available during my care of the patient were reviewed by me and considered in my medical decision making (see  chart for details).     Patient presented with flulike symptoms that started 2 days ago. She notes she was discharged from the hospital on 03/21/16 where she was diagnosed with pancreatic cancer. Patient reports she was feeling better upon discharge but states over the past 2 days she began developing symptoms. She reports her husband has also been sick with similar symptoms. Hx of diabetes, HTN, HLD, CHF. Initial vitals revealed temperature 100.4, remaining vital stable. On exam patient with mild and x-ray wheezes and bibasilar lobes, no signs of rest for distress or increased work of breathing. Abdominal exam benign. 1+ pitting edema noted to bilateral lower extremities which patient reports is chronic and unchanged. Patient given Tylenol and half liter IV fluids.  Labs revealed creatinine 1.25, suspected from dehydration. No leukocytosis. Lactic acid 1.27. AST 56, Alk phos 174, Total bili 2.7; which appears improved from recent labs performed during recent admission. Chest x-ray showed no evidence of pneumonia and no significant changes suggesting CHF exacerbation. BNP 46.1. Influenza A positive. Discussed pt with Dr. Tomi Bamberger who also evaluated pt in the ED. On reevaluation patient reports feeling significantly better after IV and PO fluids. Repeat vitals revealed temp 98.5. Plan to discharge patient home with Tamiflu and symptomatically treatment for influenza. Advised patient to follow up with her oncologist at her scheduled appointment tomorrow. Discussed return precautions.  Final Clinical Impressions(s) / ED Diagnoses   Final diagnoses:  Influenza A    New Prescriptions Discharge Medication List as of 03/27/2016  7:03 PM    START taking these medications   Details  benzonatate (TESSALON) 100 MG capsule Take 2 capsules (200 mg total) by mouth 2 (two) times daily as needed for cough., Starting Sun 03/27/2016, Print    oseltamivir (TAMIFLU) 75 MG capsule Take 1 capsule (75 mg total) by mouth every  12 (twelve) hours., Starting Sun 03/27/2016, Print    oxymetazoline (AFRIN NASAL SPRAY) 0.05 % nasal spray Place 1 spray into both nostrils 2 (two) times daily. Spray once into each nostril twice daily for up to the next 3 days. Do not use for more than 3 days to prevent rebound rhinorrhea., Starting Sun 03/27/2016, Print         Chesley Noon [place], [place] 03/27/16 2156    Dorie Rank, MD 03/28/16 1944

## 2016-03-27 NOTE — ED Notes (Signed)
Pt able to tolerate Ginger Ale PO without vomiting.

## 2016-03-28 ENCOUNTER — Inpatient Hospital Stay: Payer: PPO | Admitting: Internal Medicine

## 2016-03-28 ENCOUNTER — Emergency Department (HOSPITAL_COMMUNITY)
Admission: EM | Admit: 2016-03-28 | Discharge: 2016-03-29 | Disposition: A | Discharge status: Home / Self Care | Payer: PPO | Attending: Emergency Medicine | Admitting: Emergency Medicine

## 2016-03-28 ENCOUNTER — Emergency Department (HOSPITAL_COMMUNITY): Payer: PPO

## 2016-03-28 DIAGNOSIS — R509 Fever, unspecified: Secondary | ICD-10-CM | POA: Diagnosis not present

## 2016-03-28 DIAGNOSIS — I5032 Chronic diastolic (congestive) heart failure: Secondary | ICD-10-CM | POA: Insufficient documentation

## 2016-03-28 DIAGNOSIS — I110 Hypertensive heart disease with heart failure: Secondary | ICD-10-CM | POA: Diagnosis not present

## 2016-03-28 DIAGNOSIS — R05 Cough: Secondary | ICD-10-CM | POA: Diagnosis not present

## 2016-03-28 DIAGNOSIS — R110 Nausea: Secondary | ICD-10-CM | POA: Diagnosis not present

## 2016-03-28 DIAGNOSIS — Z7982 Long term (current) use of aspirin: Secondary | ICD-10-CM | POA: Insufficient documentation

## 2016-03-28 DIAGNOSIS — E1140 Type 2 diabetes mellitus with diabetic neuropathy, unspecified: Secondary | ICD-10-CM | POA: Insufficient documentation

## 2016-03-28 DIAGNOSIS — J111 Influenza due to unidentified influenza virus with other respiratory manifestations: Secondary | ICD-10-CM | POA: Insufficient documentation

## 2016-03-28 DIAGNOSIS — Z794 Long term (current) use of insulin: Secondary | ICD-10-CM | POA: Insufficient documentation

## 2016-03-28 DIAGNOSIS — Z79899 Other long term (current) drug therapy: Secondary | ICD-10-CM | POA: Insufficient documentation

## 2016-03-28 DIAGNOSIS — R531 Weakness: Secondary | ICD-10-CM | POA: Diagnosis not present

## 2016-03-28 LAB — COMPREHENSIVE METABOLIC PANEL
ALBUMIN: 2.8 g/dL — AB (ref 3.5–5.0)
ALT: 32 U/L (ref 14–54)
AST: 55 U/L — AB (ref 15–41)
Alkaline Phosphatase: 150 U/L — ABNORMAL HIGH (ref 38–126)
Anion gap: 12 (ref 5–15)
BUN: 19 mg/dL (ref 6–20)
CHLORIDE: 96 mmol/L — AB (ref 101–111)
CO2: 28 mmol/L (ref 22–32)
CREATININE: 1.09 mg/dL — AB (ref 0.44–1.00)
Calcium: 8.5 mg/dL — ABNORMAL LOW (ref 8.9–10.3)
GFR calc Af Amer: 58 mL/min — ABNORMAL LOW (ref >60)
GFR, EST NON AFRICAN AMERICAN: 50 mL/min — AB (ref >60)
GLUCOSE: 325 mg/dL — AB (ref 65–99)
POTASSIUM: 3.2 mmol/L — AB (ref 3.5–5.1)
Sodium: 136 mmol/L (ref 135–145)
Total Bilirubin: 2.5 mg/dL — ABNORMAL HIGH (ref 0.3–1.2)
Total Protein: 6.5 g/dL (ref 6.5–8.1)

## 2016-03-28 LAB — CBC
HEMATOCRIT: 25.7 % — AB (ref 36.0–46.0)
Hemoglobin: 8.4 g/dL — ABNORMAL LOW (ref 12.0–15.0)
MCH: 27.2 pg (ref 26.0–34.0)
MCHC: 32.7 g/dL (ref 30.0–36.0)
MCV: 83.2 fL (ref 78.0–100.0)
PLATELETS: 328 10*3/uL (ref 150–400)
RBC: 3.09 MIL/uL — ABNORMAL LOW (ref 3.87–5.11)
RDW: 17.1 % — AB (ref 11.5–15.5)
WBC: 5.7 10*3/uL (ref 4.0–10.5)

## 2016-03-28 LAB — BRAIN NATRIURETIC PEPTIDE: B Natriuretic Peptide: 93 pg/mL (ref 0.0–100.0)

## 2016-03-28 LAB — TROPONIN I: Troponin I: <0.03 ng/mL (ref <0.03)

## 2016-03-28 MED ORDER — SODIUM CHLORIDE 0.9 % IV BOLUS (SEPSIS)
500.0000 mL | Freq: Once | INTRAVENOUS | Status: AC
  Administered 2016-03-28: 500 mL via INTRAVENOUS

## 2016-03-28 MED ORDER — ONDANSETRON HCL 4 MG/2ML IJ SOLN
4.0000 mg | Freq: Once | INTRAMUSCULAR | Status: AC
  Administered 2016-03-28: 4 mg via INTRAVENOUS
  Filled 2016-03-28: qty 2

## 2016-03-28 MED ORDER — DEXTROSE 5 % IV SOLN
1.0000 g | Freq: Once | INTRAVENOUS | Status: AC
  Administered 2016-03-28: 1 g via INTRAVENOUS
  Filled 2016-03-28: qty 10

## 2016-03-28 MED ORDER — DEXTROSE 5 % IV SOLN
500.0000 mg | Freq: Once | INTRAVENOUS | Status: AC
Start: 2016-03-28 — End: 2016-03-29
  Administered 2016-03-29: 500 mg via INTRAVENOUS
  Filled 2016-03-28: qty 500

## 2016-03-28 NOTE — ED Notes (Signed)
Pt stating that she suddenly feels worse. Seems to be more confused than before. MD made aware.

## 2016-03-28 NOTE — ED Notes (Signed)
ED Provider at bedside. 

## 2016-03-28 NOTE — ED Triage Notes (Signed)
Pt dx with flu yesterday. Experiencing N/V post coughing. Body aches. CBG 426 today but has not had her insulin. Unsure if she took her tamiflu or not. Alert and oriented. Ambulatory with walker.

## 2016-03-28 NOTE — ED Notes (Signed)
Bed: [id number] Expected date:  Expected time:  Means of arrival:  Comments: 71 yr old, N,V, dx with flu

## 2016-03-29 ENCOUNTER — Other Ambulatory Visit: Payer: Self-pay | Admitting: *Deleted

## 2016-03-29 DIAGNOSIS — R066 Hiccough: Secondary | ICD-10-CM | POA: Diagnosis not present

## 2016-03-29 DIAGNOSIS — R509 Fever, unspecified: Secondary | ICD-10-CM | POA: Diagnosis not present

## 2016-03-29 DIAGNOSIS — R531 Weakness: Secondary | ICD-10-CM | POA: Diagnosis not present

## 2016-03-29 MED ORDER — FENTANYL CITRATE (PF) 100 MCG/2ML IJ SOLN
50.0000 ug | Freq: Once | INTRAMUSCULAR | Status: AC
  Administered 2016-03-29: 50 ug via INTRAVENOUS
  Filled 2016-03-29: qty 2

## 2016-03-29 MED ORDER — LEVOFLOXACIN 500 MG PO TABS: 500.0000 mg | ORAL_TABLET | Freq: Every day | ORAL | 0 refills | Status: DC

## 2016-03-29 NOTE — ED Provider Notes (Signed)
[facility] DEPT Provider Note   CSN: [id number] Arrival date & time: 03/28/16  [id number]     History   Chief Complaint Chief Complaint  Patient presents with  . Flu-like Symptoms    HPI Melissa Myers is a 71 y.o. female.  The history is provided by the patient.   Patient presents to the emergency department with complaints of ongoing generalized weakness and not feeling well.  She was seen in emergency department yesterday and diagnosed with influenza.  She was initiated on treatment.  She states today she feels no better and is requesting medication to help with her symptoms.  She reports some nausea this time but denies vomiting.  She reports no diarrhea.  She denies abdominal pain.  She denies shortness of breath or reports ongoing productive cough which she feels like might be worsening.  No chest pain or back pain.  No significant headache.   Past Medical History:  Diagnosis Date  . Anemia   . Cellulitis    LOWER EXTREMITIES  . Chronic diarrhea    a/w nausea - felt related to IBS  . Deaf    left side only  . Diastolic CHF ([place])   . Disc degeneration, lumbar   . GERD (gastroesophageal reflux disease)   . Hyperlipidemia    hx rhabdo on statins  . Hypertension   . Neuropathy (HCC)    feet, toes and fingers  . On home oxygen therapy    uses oxygen 2 liters min per [place] at night and prn during day  . OSA (obstructive sleep apnea)    05/2009 sleep study - refuses CPAP  . Osteoarthritis   . RLS (restless legs syndrome)   . Shortness of breath    chronic  . Stasis dermatitis   . Type II or unspecified type diabetes mellitus without mention of complication, not stated as uncontrolled    insulin dep    Patient Active Problem List   Diagnosis Date Noted  . Pancreatic mass   . Bile duct stricture   . Obstructive jaundice   . Abnormal CT of the abdomen   . Jaundice 03/15/2016  . Hyperbilirubinemia 03/15/2016  . Diabetic peripheral neuropathy ([place]) 02/23/2016  .  History of fall 10/22/2015  . Bradycardia 10/09/2015  . Left foot pain 07/10/2015  . Murmur, cardiac 07/02/2015  . Chronic pain syndrome 05/05/2015  . MDD (major depressive disorder), single episode, severe ([place]) 05/05/2015  . Chronic anemia 04/14/2015  . DM (diabetes mellitus), type 2 with renal complications ([place]) [id number]  . Rectal bleeding 03/13/2015  . Hyperlipidemia 12/23/2014  . Deficiency anemia 07/26/2012  . Venous stasis ulcers of both lower extremities ([place]) 01/04/2011  . INSOMNIA 08/03/2009  . Chronic diastolic heart failure ([place]) 06/01/2009  . Vitamin D deficiency 05/12/2009  . Sleep apnea 04/29/2009  . Dyslipidemia 04/20/2009  . Morbid obesity ([place]) 04/20/2009  . RESTLESS LEGS SYNDROME 04/20/2009  . Peripheral neuropathy ([place]) 04/20/2009  . Essential hypertension 04/20/2009  . Low back pain 04/20/2009    Past Surgical History:  Procedure Laterality Date  . CHOLECYSTECTOMY  1997  . COLONOSCOPY N/A 12/03/2012   Procedure: COLONOSCOPY;  Surgeon: Lafayette Dragon, MD;  Location: [facility];  Service: Endoscopy;  Laterality: N/A;  . ERCP N/A 03/18/2016   Procedure: ENDOSCOPIC RETROGRADE CHOLANGIOPANCREATOGRAPHY (ERCP);  Surgeon: Irene Shipper, MD;  Location: [facility];  Service: Endoscopy;  Laterality: N/A;  . TONSILLECTOMY  1970  . TUBAL LIGATION  1980  . UMBILICAL HERNIA REPAIR  1995  . uterine polyp removal  2008    OB History    No data available       Home Medications    Prior to Admission medications   Medication Sig Start Date End Date Taking? Authorizing Provider  aspirin EC 81 MG tablet Take 1 tablet (81 mg total) by mouth daily. 07/08/14   Rowe Clack, MD  B Complex-C (B-COMPLEX WITH VITAMIN C) tablet Take 1 tablet by mouth daily.    Historical Provider, MD  benzonatate (TESSALON) 100 MG capsule Take 2 capsules (200 mg total) by mouth 2 (two) times daily as needed for cough. 03/27/16   Nona Dell, PA-C  Blood Glucose Calibration  (ONETOUCH VERIO) SOLN Use to check sugars up to 4 times per day 10/23/15   [name], MD  carvedilol (COREG) 25 MG tablet TAKE 1 TABLET BY MOUTH TWICE DAILY WITH FOOD 01/20/16   Jolaine Artist, MD  Cholecalciferol (VITAMIN D3) 2000 units capsule Take 1 capsule (2,000 Units total) by mouth daily. 06/17/15   Janith Lima, MD  citalopram (CELEXA) 20 MG tablet TAKE 1 TABLET (20 MG TOTAL) BY MOUTH DAILY. 01/27/16   [name], MD  Coenzyme Q10 200 MG capsule Take 200 mg by mouth daily.    Historical Provider, MD  diphenhydrAMINE (BENADRYL) 25 mg capsule Take 1 capsule (25 mg total) by mouth every 8 (eight) hours as needed for itching. 03/21/16   Mariel Aloe, MD  DULoxetine (CYMBALTA) 60 MG capsule Take 1 capsule (60 mg total) by mouth daily. 09/25/15   [name], MD  ezetimibe (ZETIA) 10 MG tablet TAKE ONE TABLET BY MOUTH DAILY 02/03/16   [name], MD  fenofibrate 160 MG tablet TAKE 1 TABLET (160 MG TOTAL) BY MOUTH DAILY. MUST ESTABLISH WITH NEW PCP FOR ADDITIONAL REFILLS. 02/25/15   [name], MD  gabapentin (NEURONTIN) 300 MG capsule TAKE TWO CAPSULES BY MOUTH FOUR TIMES A DAY 01/19/16   [name], MD  glucose blood (ONETOUCH VERIO) test strip Use as instructed up to 4 times per day 10/23/15   [name], MD  hydrALAZINE (APRESOLINE) 25 MG tablet TAKE 1 TABLET (25 MG TOTAL) BY MOUTH 3 (THREE) TIMES DAILY. 02/16/16   Jolaine Artist, MD  HYDROcodone-acetaminophen (NORCO) 10-325 MG tablet Take 1 tablet by mouth every 6 (six) hours as needed. 03/21/16   Mariel Aloe, MD  insulin aspart (NOVOLOG) 100 UNIT/ML injection Inject 0-20 Units into the skin 3 (three) times daily with meals. . CBG 70 - 120: 0 units CBG 121 - 150: 3 units CBG 151 - 200: 4 units CBG 201 - 250: 7 units CBG 251 - 300: 11 units CBG 301 - 350: 15 units CBG 351 - 400: 20 units 03/21/16   Mariel Aloe, MD  insulin glargine (LANTUS) 100 UNIT/ML injection  Inject 0.3 mLs (30 Units total) into the skin daily. 03/22/16   Mariel Aloe, MD  levofloxacin (LEVAQUIN) 500 MG tablet Take 1 tablet (500 mg total) by mouth daily. 03/29/16   Jola Schmidt, MD  Magnesium 500 MG TABS Take 500 mg by mouth daily.    Historical Provider, MD  meclizine (ANTIVERT) 25 MG tablet Take 25 mg by mouth 3 (three) times daily as needed for dizziness.    Historical Provider, MD  meloxicam (MOBIC) 7.5 MG tablet TAKE ONE TABLET BY MOUTH DAILY 03/11/16   [name], MD  metFORMIN (GLUCOPHAGE-XR) 500 MG  24 hr tablet Take 1,000 mg by mouth daily with breakfast.     Historical Provider, MD  metolazone (ZAROXOLYN) 2.5 MG tablet Take 1 tablet (2.5 mg total) by mouth 2 (two) times a week. 12/17/15   Devlyn Friar, PA-C  omega-3 acid ethyl esters (LOVAZA) 1 g capsule Take 2 capsules (2 g total) by mouth 2 (two) times daily. 07/06/15   Larey Dresser, MD  oseltamivir (TAMIFLU) 75 MG capsule Take 1 capsule (75 mg total) by mouth every 12 (twelve) hours. 03/27/16   Nona Dell, PA-C  oxymetazoline (AFRIN NASAL SPRAY) 0.05 % nasal spray Place 1 spray into both nostrils 2 (two) times daily. Spray once into each nostril twice daily for up to the next 3 days. Do not use for more than 3 days to prevent rebound rhinorrhea. 03/27/16   Nona Dell, PA-C  pregabalin (LYRICA) 200 MG capsule Take 1 capsule (200 mg total) by mouth 2 (two) times daily. 02/23/16   [name], MD  protein supplement shake (PREMIER PROTEIN) LIQD Take 325 mLs (11 oz total) by mouth 2 (two) times daily between meals. 03/21/16   Mariel Aloe, MD  rOPINIRole (REQUIP XL) 4 MG 24 hr tablet TAKE 1 TABLET (4 MG TOTAL) BY MOUTH AT BEDTIME. 02/23/16   [name], MD  spironolactone (ALDACTONE) 25 MG tablet TAKE 1 TABLET BY MOUTH DAILY 09/21/15   Jolaine Artist, MD  torsemide (DEMADEX) 100 MG tablet Take 1 tablet (100 mg total) by mouth daily. 03/21/16   Mariel Aloe, MD  vitamin  B-12 (CYANOCOBALAMIN) 500 MCG tablet Take 500 mcg by mouth daily. Reported on 09/09/2015    Historical Provider, MD    Family History Family History  Problem Relation Age of Onset  . Heart disease Mother   . Heart attack Mother 30  . Heart disease Father   . Heart attack Father 26  . Heart disease      family history  . Stomach cancer Paternal Grandmother   . Lung cancer Paternal Grandfather   . CVA      several aunts  . Heart attack      several aunts and an uncle  . Colon cancer Neg Hx     Social History Social History  Substance Use Topics  . Smoking status: Never Smoker  . Smokeless tobacco: Never Used  . Alcohol use No     Allergies   Cymbalta [duloxetine hcl]; Statins; Sulfa antibiotics; Levemir [insulin detemir]; Morphine; and Zinc   Review of Systems Review of Systems  All other systems reviewed and are negative.    Physical Exam Updated Vital Signs BP (!) 113/36   Pulse (!) 52   Temp 98.4 F (36.9 C) (Oral)   Resp 15   SpO2 99%   Physical Exam  Constitutional: She is oriented to person, place, and time. She appears well-developed and well-nourished. No distress.  HENT:  Head: Normocephalic and atraumatic.  Eyes: EOM are normal. Pupils are equal, round, and reactive to light.  Neck: Normal range of motion.  Cardiovascular: Normal rate, regular rhythm and normal heart sounds.   Pulmonary/Chest: Effort normal and breath sounds normal.  Abdominal: Soft. She exhibits no distension. There is no tenderness.  Musculoskeletal: Normal range of motion.  Neurological: She is alert and oriented to person, place, and time.  5/5 strength in major muscle groups of  bilateral upper and lower extremities. Speech normal. No facial asymetry.   Skin: Skin is warm  and dry.  Psychiatric: She has a normal mood and affect. Judgment normal.  Nursing note and vitals reviewed.    ED Treatments / Results  Labs (all labs ordered are listed, but only abnormal results are  displayed) Labs Reviewed  CBC - Abnormal; Notable for the following:       Result Value   RBC 3.09 (*)    Hemoglobin 8.4 (*)    HCT 25.7 (*)    RDW 17.1 (*)    All other components within normal limits  COMPREHENSIVE METABOLIC PANEL - Abnormal; Notable for the following:    Potassium 3.2 (*)    Chloride 96 (*)    Glucose, Bld 325 (*)    Creatinine, Ser 1.09 (*)    Calcium 8.5 (*)    Albumin 2.8 (*)    AST 55 (*)    Alkaline Phosphatase 150 (*)    Total Bilirubin 2.5 (*)    GFR calc non Af Amer 50 (*)    GFR calc Af Amer 58 (*)    All other components within normal limits  TROPONIN I  BRAIN NATRIURETIC PEPTIDE    EKG  EKG Interpretation  Date/Time:  Monday March 28 2016 21:05:46 EST Ventricular Rate:  60 PR Interval:    QRS Duration: 158 QT Interval:  496 QTC Calculation: 496 R Axis:   -167 Text Interpretation:  Sinus rhythm Right bundle branch block No significant change was found Confirmed by Zaylin Runco  MD, Lennette Bihari ([id number]) on 03/29/2016 1:59:21 AM       Radiology Dg Chest 2 View  Result Date: 03/28/2016 CLINICAL DATA:  71 year old diagnosed with influenza yesterday, presenting with acute onset cough and generalized body aches. EXAM: CHEST  2 VIEW COMPARISON:  03/27/2016, 10/09/2015 and earlier. FINDINGS: AP semi-erect and lateral images were obtained. Cardiac silhouette mildly enlarged, unchanged. Thoracic aorta mildly atherosclerotic, unchanged. Hilar and mediastinal contours otherwise unremarkable. Chronic pulmonary venous hypertension, stable dating back to 2017. Moderate central peribronchial thickening, unchanged since yesterday but more prominent than on the examinations in 2017. No confluent airspace consolidation. No pleural effusions. Degenerative changes involving the thoracic spine. IMPRESSION: Moderate changes of acute bronchitis/asthma without focal airspace pneumonia. Stable mild cardiomegaly and chronic pulmonary venous hypertension. Electronically Signed   By:  Evangeline Dakin M.D.   On: 03/28/2016 20:55   Dg Chest 2 View  Result Date: 03/27/2016 CLINICAL DATA:  Flu-like symptoms, mild confusion, vomiting, nausea. EXAM: CHEST  2 VIEW COMPARISON:  Chest x-rays dated 10/09/2015, 08/25/2015 and 11/16/2014. FINDINGS: Cardiomegaly is stable. There is mild central pulmonary vascular congestion, without evidence of overt alveolar pulmonary edema. No confluent opacity to suggest a developing pneumonia. No pleural effusion or pneumothorax seen. Mild degenerative spurring noted throughout the thoracic spine. No acute or suspicious osseous finding. IMPRESSION: 1. Cardiomegaly with mild pulmonary vascular congestion, similar to multiple previous examinations, likely chronic mild CHF/volume overload. No overt alveolar pulmonary edema. 2. No evidence of pneumonia. Electronically Signed   By: Franki Cabot M.D.   On: 03/27/2016 13:13    Procedures Procedures (including critical care time)  Medications Ordered in ED Medications  sodium chloride 0.9 % bolus 500 mL (0 mLs Intravenous Stopped 03/28/16 2215)  ondansetron (ZOFRAN) injection 4 mg (4 mg Intravenous Given 03/28/16 2302)  cefTRIAXone (ROCEPHIN) 1 g in dextrose 5 % 50 mL IVPB (0 g Intravenous Stopped 03/29/16 0031)  azithromycin (ZITHROMAX) 500 mg in dextrose 5 % 250 mL IVPB (0 mg Intravenous Stopped 03/29/16 0150)  fentaNYL (SUBLIMAZE) injection 50 mcg (  50 mcg Intravenous Given 03/29/16 0044)     Initial Impression / Assessment and Plan / ED Course  I have reviewed the triage vital signs and the nursing notes.  Pertinent labs & imaging results that were available during my care of the patient were reviewed by me and considered in my medical decision making (see chart for details).     Patient is overall well-appearing.  I suspect ongoing influenza.  She'll be started on antibiotics for possible developing pneumonia given her increasing productive cough.  Nursing staff reported some altered mental status but I went  in there on several more occasions and noticed no altered mental status.  Abdominal exam was benign.  Nausea was treated.  She feels much better this time.  She would like to be discharged home.  ) Primary care follow-up.  She understands return the ER for new or worsening symptoms  Final Clinical Impressions(s) / ED Diagnoses   Final diagnoses:  Influenza  Nausea    New Prescriptions New Prescriptions   LEVOFLOXACIN (LEVAQUIN) 500 MG TABLET    Take 1 tablet (500 mg total) by mouth daily.     Jola Schmidt, MD 03/29/16 [phone]

## 2016-03-29 NOTE — ED Notes (Signed)
Patient was alert, oriented and stable upon discharge. RN went over AVS and patient had no further questions.  

## 2016-03-29 NOTE — Patient Outreach (Signed)
[facility]) Care Management  03/29/2016  BRIAN KOCOUREK August 03, 1945 [id number]   Per EPIC report, patient discharged to [facility]. RNCM met with Lowella Fairy, SW at facility, she reports that patient discharged within a day or two of admission, stating she "only came to facility because the hospital wanted her to" and that patient confirmed that she had caregiver support at home.  EPIC chart review reveals that patient is enrolled under [facility] program, which is providing care management and Palliative care services.   Plan to sign off. Royetta Crochet. Laymond Purser, RN, BSN, [place] [phone]) Business Cell  [phone]) Toll Free Office

## 2016-04-01 ENCOUNTER — Telehealth: Payer: Self-pay | Admitting: *Deleted

## 2016-04-01 NOTE — Telephone Encounter (Signed)
Nurse has left msg twice wanting to let MD know that she was out seeing pt today. Pt has been in the hosp w/flu. Was sent home,and she went back to Er on Tues was given IV antibiotic, and rx for Levaquin. Pt did not get rx filled she has not been eating, she states she feels bad. Her vitals BP 134/68, (P) 62, and 92 o2 room air. Pt has not been checking BS she check it was 600. Tried to get pt to go back to ER but she refuses. She does have some wheezing, but otherwise lungs sounds clear....Johny Chess

## 2016-04-04 ENCOUNTER — Telehealth: Payer: Self-pay | Admitting: *Deleted

## 2016-04-04 NOTE — Telephone Encounter (Signed)
Noted  

## 2016-04-04 NOTE — Telephone Encounter (Signed)
Son left msg on triage requesting to pick-up pain med (Hydrocodone) for mom,,,/lmb

## 2016-04-05 NOTE — Telephone Encounter (Signed)
This is not due for refill and she should not be out.

## 2016-04-05 NOTE — Telephone Encounter (Signed)
Notified son w/MD response. He stated that his mom has misplaced the bottle & they are trying to find it....Melissa Myers

## 2016-04-06 ENCOUNTER — Other Ambulatory Visit: Payer: Self-pay | Admitting: Internal Medicine

## 2016-04-08 ENCOUNTER — Ambulatory Visit (INDEPENDENT_AMBULATORY_CARE_PROVIDER_SITE_OTHER): Payer: PPO | Admitting: Internal Medicine

## 2016-04-08 ENCOUNTER — Encounter: Payer: Self-pay | Admitting: Internal Medicine

## 2016-04-08 DIAGNOSIS — K838 Other specified diseases of biliary tract: Secondary | ICD-10-CM | POA: Diagnosis not present

## 2016-04-08 DIAGNOSIS — K831 Obstruction of bile duct: Secondary | ICD-10-CM

## 2016-04-08 DIAGNOSIS — K8689 Other specified diseases of pancreas: Secondary | ICD-10-CM

## 2016-04-08 DIAGNOSIS — E1122 Type 2 diabetes mellitus with diabetic chronic kidney disease: Secondary | ICD-10-CM | POA: Diagnosis not present

## 2016-04-08 DIAGNOSIS — G2581 Restless legs syndrome: Secondary | ICD-10-CM | POA: Diagnosis not present

## 2016-04-08 DIAGNOSIS — N184 Chronic kidney disease, stage 4 (severe): Secondary | ICD-10-CM

## 2016-04-08 DIAGNOSIS — K869 Disease of pancreas, unspecified: Secondary | ICD-10-CM | POA: Diagnosis not present

## 2016-04-08 DIAGNOSIS — Z794 Long term (current) use of insulin: Secondary | ICD-10-CM

## 2016-04-08 MED ORDER — HYDROCODONE-ACETAMINOPHEN 10-325 MG PO TABS: 1.0000 | ORAL_TABLET | Freq: Four times a day (QID) | ORAL | 0 refills | Status: DC | PRN

## 2016-04-08 NOTE — Progress Notes (Signed)
Pre visit review using our clinic review tool, if applicable. No additional management support is needed unless otherwise documented below in the visit note. 

## 2016-04-08 NOTE — Progress Notes (Signed)
   Subjective:    Patient ID: Melissa Myers, female    DOB: 11-29-1945, 71 y.o.   MRN: [id number]  HPI The patient is a 71 YO female coming in for hospital follow up (recently diagnosed with pancreatic cancer and then had influenza, she is having biopsy in the next several weeks but imaging very likely and with mets and Ca 19-9 elevated). She is not having pain due to stent placed, her bilirubin is trending down. Still having some itching at home. She is still having leg pain and this is her main concern. She is taking gabapentin and in the hospital she was taking lyrica and gabapentin and this was helping more. She would like to be back on this. No new chest pain or SOB. Her activity is very limited. She is not having much cough left, no SOB, mild congestion which is decreasing.   PMH, [facility], social history reviewed and updated.   Review of Systems  Constitutional: Positive for activity change, appetite change and fatigue. Negative for chills, fever and unexpected weight change.  HENT: Negative.   Eyes: Negative.   Respiratory: Negative for cough, chest tightness, shortness of breath and wheezing.   Cardiovascular: Positive for leg swelling. Negative for chest pain and palpitations.  Gastrointestinal: Positive for abdominal distention and diarrhea. Negative for abdominal pain, blood in stool, constipation, nausea and vomiting.  Endocrine: Positive for polyuria.  Musculoskeletal: Positive for arthralgias, back pain, gait problem and myalgias. Negative for joint swelling, neck pain and neck stiffness.  Skin: Positive for color change. Negative for pallor, rash and wound.  Neurological: Positive for weakness. Negative for dizziness, light-headedness and headaches.  Psychiatric/Behavioral: Negative.       Objective:   Physical Exam  Constitutional: She appears well-developed and well-nourished.  Overweight  HENT:  Head: Normocephalic and atraumatic.  Eyes: EOM are normal.  Neck: Normal  range of motion.  Cardiovascular: Normal rate and regular rhythm.   Pulmonary/Chest: Effort normal and breath sounds normal.  Limited due to habitus  Abdominal: Soft. Bowel sounds are normal. She exhibits no distension. There is no tenderness. There is no rebound and no guarding.  Distention versus habitus  Musculoskeletal: She exhibits edema.  Stable 2+ edema  Neurological: She is alert. Coordination abnormal.  Sleepy and not oriented times 3 during visit but can follow 2 step commands  Skin: Skin is warm.  Color change in the legs which is stable  Psychiatric:  Sleepy during the visit   Vitals:   04/08/16 1448  BP: (!) 142/76  Pulse: 70  Temp: 98.2 F (36.8 C)  TempSrc: Oral  SpO2: 100%  Weight: 242 lb (109.8 kg)  Height: 4\' 10"  (1.473 m)      Assessment & Plan:

## 2016-04-08 NOTE — Patient Instructions (Addendum)
We have given you the pain medicine and for Winifred.   If you have any questions or problems through this process call our office or come back in.

## 2016-04-09 ENCOUNTER — Encounter: Payer: Self-pay | Admitting: Internal Medicine

## 2016-04-09 NOTE — Assessment & Plan Note (Signed)
Likely the pancreatic cancer is causing the acute worsening of her sugars. She is not checked them at home and they are taking insulin TID or QID depending on the day. Her husband gives her the injections. She is reminded about the need to monitor so we can adjust as the high sugar readings can also cause worsening mental status.

## 2016-04-09 NOTE — Assessment & Plan Note (Signed)
Taking gabapentin and lyrica and will refill as these are helping. In the light of her recent pancreatic cancer we are focusing on her comfort and needs even if this causes some sacrifice in mental clarity. We did discuss this risk and the possible need to adjust in the future.

## 2016-04-09 NOTE — Assessment & Plan Note (Signed)
Likely pancreatic cancer and we discussed at the visit. She is waiting on the biopsy and then visit with oncology later this month. We discussed the prognosis as well.

## 2016-04-09 NOTE — Assessment & Plan Note (Signed)
Improving and bilirubin just checked and down to 2.5 from 14 which is a sign that the stent is still in place.

## 2016-04-18 ENCOUNTER — Ambulatory Visit: Payer: PPO | Admitting: Nurse Practitioner

## 2016-04-19 ENCOUNTER — Encounter: Payer: Self-pay | Admitting: Gastroenterology

## 2016-04-19 ENCOUNTER — Ambulatory Visit (INDEPENDENT_AMBULATORY_CARE_PROVIDER_SITE_OTHER): Payer: PPO | Admitting: Gastroenterology

## 2016-04-19 VITALS — BP 120/50 | HR 76

## 2016-04-19 DIAGNOSIS — E119 Type 2 diabetes mellitus without complications: Secondary | ICD-10-CM

## 2016-04-19 DIAGNOSIS — K869 Disease of pancreas, unspecified: Secondary | ICD-10-CM

## 2016-04-19 DIAGNOSIS — K831 Obstruction of bile duct: Secondary | ICD-10-CM

## 2016-04-19 DIAGNOSIS — K838 Other specified diseases of biliary tract: Secondary | ICD-10-CM

## 2016-04-19 DIAGNOSIS — IMO0001 Reserved for inherently not codable concepts without codable children: Secondary | ICD-10-CM

## 2016-04-19 DIAGNOSIS — Z794 Long term (current) use of insulin: Secondary | ICD-10-CM | POA: Diagnosis not present

## 2016-04-19 DIAGNOSIS — K8689 Other specified diseases of pancreas: Secondary | ICD-10-CM

## 2016-04-19 NOTE — Progress Notes (Signed)
   04/19/2016 Melissa Myers [id number] 08/14/1945   HISTORY OF PRESENT ILLNESS:  This is a 70 year old female who presented to [facility] ED in January with obstructive jaundice.  Imaging suggested likely secondary to pancreatic cancer in uncinate process.  She is status post ERCP with biliary stent placement in January by Dr. Perry. Liver tests improved/stable on 2/5.  Patient is here to discuss EUS.  Has IDDM and has home O2 but reportedly only uses that on occasion in the humid summer months.  She apparently signed herself out of rehab facility after one day, but multiple family members here today report that she is about at her baseline.  She feels ok.  No abdominal pain, nausea, vomiting.  Just very tired.    CA 19-9 is elevated at 107.   Past Medical History:  Diagnosis Date  . Anemia   . Cellulitis    LOWER EXTREMITIES  . Chronic diarrhea    a/w nausea - felt related to IBS  . Deaf    left side only  . Diastolic CHF (HCC)   . Disc degeneration, lumbar   . GERD (gastroesophageal reflux disease)   . Hyperlipidemia    hx rhabdo on statins  . Hypertension   . Neuropathy (HCC)    feet, toes and fingers  . On home oxygen therapy    uses oxygen 2 liters min per [place] at night and prn during day  . OSA (obstructive sleep apnea)    05/2009 sleep study - refuses CPAP  . Osteoarthritis   . RLS (restless legs syndrome)   . Shortness of breath    chronic  . Stasis dermatitis   . Type II or unspecified type diabetes mellitus without mention of complication, not stated as uncontrolled    insulin dep   Past Surgical History:  Procedure Laterality Date  . CHOLECYSTECTOMY  1997  . COLONOSCOPY N/A 12/03/2012   Procedure: COLONOSCOPY;  Surgeon: Dora M Brodie, MD;  Location: [facility];  Service: Endoscopy;  Laterality: N/A;  . ERCP N/A 03/18/2016   Procedure: ENDOSCOPIC RETROGRADE CHOLANGIOPANCREATOGRAPHY (ERCP);  Surgeon: John N Perry, MD;  Location: [facility];  Service: Endoscopy;   Laterality: N/A;  . TONSILLECTOMY  1970  . TUBAL LIGATION  1980  . UMBILICAL HERNIA REPAIR  1995  . uterine polyp removal  2008    reports that she has never smoked. She has never used smokeless tobacco. She reports that she does not drink alcohol or use drugs. family history includes Heart attack (age of onset: 52) in her mother; Heart attack (age of onset: 54) in her father; Heart disease in her father and mother; Lung cancer in her paternal grandfather; Stomach cancer in her paternal grandmother. Allergies  Allergen Reactions  . Cymbalta [Duloxetine Hcl] Other (See Comments)    Restless leg syndrome  . Statins Other (See Comments)    Statin drugs cause muscle pain / "muscle damage"--was told by MD not to take  . Sulfa Antibiotics Diarrhea  . Levemir [Insulin Detemir] Itching  . Morphine Other (See Comments)    GI upset and headaches  . Zinc Swelling and Rash      Outpatient Encounter Prescriptions as of 04/19/2016  Medication Sig  . aspirin EC 81 MG tablet Take 1 tablet (81 mg total) by mouth daily.  . carvedilol (COREG) 25 MG tablet TAKE 1 TABLET BY MOUTH TWICE DAILY WITH FOOD  . Cholecalciferol (VITAMIN D3) 2000 units capsule Take 1 capsule (2,000 Units total)   by mouth daily.  . citalopram (CELEXA) 20 MG tablet TAKE 1 TABLET (20 MG TOTAL) BY MOUTH DAILY.  . DULoxetine (CYMBALTA) 60 MG capsule Take 1 capsule (60 mg total) by mouth daily.  . ezetimibe (ZETIA) 10 MG tablet TAKE ONE TABLET BY MOUTH DAILY  . gabapentin (NEURONTIN) 300 MG capsule TAKE TWO CAPSULES BY MOUTH FOUR TIMES A DAY  . glucose blood (ONETOUCH VERIO) test strip Use as instructed up to 4 times per day  . hydrALAZINE (APRESOLINE) 25 MG tablet TAKE 1 TABLET (25 MG TOTAL) BY MOUTH 3 (THREE) TIMES DAILY.  . HYDROcodone-acetaminophen (NORCO) 10-325 MG tablet Take 1 tablet by mouth every 6 (six) hours as needed.  . insulin aspart (NOVOLOG) 100 UNIT/ML injection Inject 0-20 Units into the skin 3 (three) times daily with  meals. . CBG 70 - 120: 0 units CBG 121 - 150: 3 units CBG 151 - 200: 4 units CBG 201 - 250: 7 units CBG 251 - 300: 11 units CBG 301 - 350: 15 units CBG 351 - 400: 20 units  . insulin glargine (LANTUS) 100 UNIT/ML injection Inject 0.3 mLs (30 Units total) into the skin daily.  . LYRICA 200 MG capsule TAKE ONE CAPSULE BY MOUTH TWICE A DAY  . meloxicam (MOBIC) 7.5 MG tablet TAKE ONE TABLET BY MOUTH DAILY  . metFORMIN (GLUCOPHAGE-XR) 500 MG 24 hr tablet Take 1,000 mg by mouth daily with breakfast.   . metolazone (ZAROXOLYN) 2.5 MG tablet Take 1 tablet (2.5 mg total) by mouth 2 (two) times a week.  . rOPINIRole (REQUIP XL) 4 MG 24 hr tablet TAKE 1 TABLET (4 MG TOTAL) BY MOUTH AT BEDTIME.  . spironolactone (ALDACTONE) 25 MG tablet TAKE 1 TABLET BY MOUTH DAILY  . torsemide (DEMADEX) 100 MG tablet Take 1 tablet (100 mg total) by mouth daily.  . vitamin B-12 (CYANOCOBALAMIN) 500 MCG tablet Take 500 mcg by mouth daily. Reported on 09/09/2015  . [DISCONTINUED] meclizine (ANTIVERT) 25 MG tablet Take 25 mg by mouth 3 (three) times daily as needed for dizziness.   No facility-administered encounter medications on file as of 04/19/2016.      REVIEW OF SYSTEMS  : All other systems reviewed and negative except where noted in the History of Present Illness.   PHYSICAL EXAM: BP (!) 120/50   Pulse 76  General: Well developed white female in no acute distress; in wheelchair Head: Normocephalic and atraumatic Eyes:  Sclerae anicteric, conjunctiva pink. Ears: Normal auditory acuity Lungs: Clear throughout to auscultation Heart: Regular rate and rhythm Abdomen: Soft, non-distended. Normal bowel sounds. Non-tender. Musculoskeletal: Symmetrical with no gross deformities  Skin: No lesions on visible extremities Extremities:  Some swelling and skin changes c/w venous stasis in B/L LE's. Neurological: Alert oriented x 4, grossly non-focal Psychological:  Alert and cooperative. Normal mood and  affect  ASSESSMENT AND PLAN: #1. Obstructive jaundice likely secondary to pancreatic cancer status post ERCP with biliary stent placement in January. Liver tests improved/stable on 2/5.  Does not appear obstructed clinically today so will hold off on repeat LFT's for now. #2  IDDM #3  Occasional use of home O2:  Usually in the humid summer months.  *Will be scheduled for upper EUS with Dr. Jacobs after the case was discussed with him today.  The risks, benefits, and alternatives to EUS were discussed with the patient and she consents to proceed.   CC:  Crawford, Elizabeth A, *   

## 2016-04-19 NOTE — Patient Instructions (Signed)
You have been scheduled for an endoscopic ultrasound. Please follow written instructions given to you at your visit today. If you use inhalers (even only as needed), please bring them with you on the day of your procedure.

## 2016-04-20 ENCOUNTER — Other Ambulatory Visit (HOSPITAL_COMMUNITY): Payer: Self-pay | Admitting: Cardiology

## 2016-04-22 ENCOUNTER — Telehealth (HOSPITAL_COMMUNITY): Payer: Self-pay | Admitting: *Deleted

## 2016-04-22 NOTE — Telephone Encounter (Signed)
[name] with [facility] to report that on Tuesday patient's BP was 88/50 and HR 56.  Patient was feeling very tired that day.  Lab work was faxed to [place] and they held Carvedilol that night and held torsemide, metolazone, potassium, and spiro as well that next day.  BP yesterday was 150/60 and HR 70.  Patient's weight is down to 229.6 from 237 last week.    Lab results reviewed by Darrick Grinder, NP and Jettie Booze, NP and no further changed at this time. [name] is aware.

## 2016-04-25 NOTE — Progress Notes (Signed)
Assessment and plan as noted. Appreciate Dr. Ardis Hughs help

## 2016-04-28 ENCOUNTER — Ambulatory Visit (HOSPITAL_COMMUNITY): Payer: PPO | Admitting: Certified Registered Nurse Anesthetist

## 2016-04-28 ENCOUNTER — Encounter (HOSPITAL_COMMUNITY)
Admission: RE | Disposition: A | Discharge status: Home / Self Care | Payer: Self-pay | Source: Ambulatory Visit | Attending: Gastroenterology

## 2016-04-28 ENCOUNTER — Encounter (HOSPITAL_COMMUNITY): Payer: Self-pay | Admitting: Gastroenterology

## 2016-04-28 ENCOUNTER — Ambulatory Visit (HOSPITAL_COMMUNITY)
Admission: RE | Admit: 2016-04-28 | Discharge: 2016-04-28 | Disposition: A | Discharge status: Home / Self Care | Payer: PPO | Source: Ambulatory Visit | Attending: Gastroenterology | Admitting: Gastroenterology

## 2016-04-28 ENCOUNTER — Telehealth: Payer: Self-pay | Admitting: Internal Medicine

## 2016-04-28 ENCOUNTER — Other Ambulatory Visit: Payer: Self-pay

## 2016-04-28 DIAGNOSIS — Z794 Long term (current) use of insulin: Secondary | ICD-10-CM | POA: Insufficient documentation

## 2016-04-28 DIAGNOSIS — E1140 Type 2 diabetes mellitus with diabetic neuropathy, unspecified: Secondary | ICD-10-CM | POA: Diagnosis not present

## 2016-04-28 DIAGNOSIS — C259 Malignant neoplasm of pancreas, unspecified: Secondary | ICD-10-CM

## 2016-04-28 DIAGNOSIS — Z9049 Acquired absence of other specified parts of digestive tract: Secondary | ICD-10-CM | POA: Diagnosis not present

## 2016-04-28 DIAGNOSIS — I5032 Chronic diastolic (congestive) heart failure: Secondary | ICD-10-CM | POA: Diagnosis not present

## 2016-04-28 DIAGNOSIS — Z7982 Long term (current) use of aspirin: Secondary | ICD-10-CM | POA: Diagnosis not present

## 2016-04-28 DIAGNOSIS — G4733 Obstructive sleep apnea (adult) (pediatric): Secondary | ICD-10-CM | POA: Diagnosis not present

## 2016-04-28 DIAGNOSIS — F329 Major depressive disorder, single episode, unspecified: Secondary | ICD-10-CM | POA: Diagnosis not present

## 2016-04-28 DIAGNOSIS — K869 Disease of pancreas, unspecified: Secondary | ICD-10-CM | POA: Diagnosis not present

## 2016-04-28 DIAGNOSIS — Z79899 Other long term (current) drug therapy: Secondary | ICD-10-CM | POA: Diagnosis not present

## 2016-04-28 DIAGNOSIS — R933 Abnormal findings on diagnostic imaging of other parts of digestive tract: Secondary | ICD-10-CM

## 2016-04-28 DIAGNOSIS — K831 Obstruction of bile duct: Secondary | ICD-10-CM | POA: Insufficient documentation

## 2016-04-28 DIAGNOSIS — G4730 Sleep apnea, unspecified: Secondary | ICD-10-CM | POA: Insufficient documentation

## 2016-04-28 DIAGNOSIS — E1165 Type 2 diabetes mellitus with hyperglycemia: Secondary | ICD-10-CM | POA: Insufficient documentation

## 2016-04-28 DIAGNOSIS — C250 Malignant neoplasm of head of pancreas: Secondary | ICD-10-CM | POA: Diagnosis not present

## 2016-04-28 DIAGNOSIS — K8689 Other specified diseases of pancreas: Secondary | ICD-10-CM

## 2016-04-28 DIAGNOSIS — I110 Hypertensive heart disease with heart failure: Secondary | ICD-10-CM | POA: Diagnosis not present

## 2016-04-28 DIAGNOSIS — Z791 Long term (current) use of non-steroidal anti-inflammatories (NSAID): Secondary | ICD-10-CM | POA: Insufficient documentation

## 2016-04-28 DIAGNOSIS — E785 Hyperlipidemia, unspecified: Secondary | ICD-10-CM | POA: Insufficient documentation

## 2016-04-28 DIAGNOSIS — I10 Essential (primary) hypertension: Secondary | ICD-10-CM | POA: Diagnosis not present

## 2016-04-28 HISTORY — PX: EUS: SHX5427

## 2016-04-28 LAB — COMPREHENSIVE METABOLIC PANEL
ALK PHOS: 148 U/L — AB (ref 38–126)
ALT: 23 U/L (ref 14–54)
AST: 27 U/L (ref 15–41)
Albumin: 3.3 g/dL — ABNORMAL LOW (ref 3.5–5.0)
Anion gap: 12 (ref 5–15)
BUN: 34 mg/dL — ABNORMAL HIGH (ref 6–20)
CALCIUM: 9.3 mg/dL (ref 8.9–10.3)
CO2: 29 mmol/L (ref 22–32)
Chloride: 90 mmol/L — ABNORMAL LOW (ref 101–111)
Creatinine, Ser: 1.21 mg/dL — ABNORMAL HIGH (ref 0.44–1.00)
GFR, EST AFRICAN AMERICAN: 51 mL/min — AB (ref >60)
GFR, EST NON AFRICAN AMERICAN: 44 mL/min — AB (ref >60)
Glucose, Bld: 449 mg/dL — ABNORMAL HIGH (ref 65–99)
Potassium: 3.8 mmol/L (ref 3.5–5.1)
Sodium: 131 mmol/L — ABNORMAL LOW (ref 135–145)
Total Bilirubin: 1.3 mg/dL — ABNORMAL HIGH (ref 0.3–1.2)
Total Protein: 7 g/dL (ref 6.5–8.1)

## 2016-04-28 LAB — GLUCOSE, CAPILLARY
GLUCOSE-CAPILLARY: 423 mg/dL — AB (ref 65–99)
GLUCOSE-CAPILLARY: 435 mg/dL — AB (ref 65–99)
Glucose-Capillary: 312 mg/dL — ABNORMAL HIGH (ref 65–99)
Glucose-Capillary: 180 mg/dL — ABNORMAL HIGH (ref 65–99)

## 2016-04-28 SURGERY — UPPER ENDOSCOPIC ULTRASOUND (EUS) LINEAR
Anesthesia: Monitor Anesthesia Care

## 2016-04-28 MED ORDER — LIDOCAINE 2% (20 MG/ML) 5 ML SYRINGE
INTRAMUSCULAR | Status: DC | PRN
  Administered 2016-04-28: 100 mg via INTRAVENOUS

## 2016-04-28 MED ORDER — INSULIN ASPART 100 UNIT/ML ~~LOC~~ SOLN
15.0000 [IU] | Freq: Once | SUBCUTANEOUS | Status: DC
  Filled 2016-04-28: qty 0.15

## 2016-04-28 MED ORDER — ONDANSETRON HCL 4 MG/2ML IJ SOLN
INTRAMUSCULAR | Status: AC
  Filled 2016-04-28: qty 2

## 2016-04-28 MED ORDER — PROPOFOL 10 MG/ML IV BOLUS
INTRAVENOUS | Status: AC
  Filled 2016-04-28: qty 20

## 2016-04-28 MED ORDER — LIDOCAINE 2% (20 MG/ML) 5 ML SYRINGE
INTRAMUSCULAR | Status: AC
  Filled 2016-04-28: qty 5

## 2016-04-28 MED ORDER — PROPOFOL 10 MG/ML IV BOLUS
INTRAVENOUS | Status: AC
  Filled 2016-04-28: qty 60

## 2016-04-28 MED ORDER — PROPOFOL 500 MG/50ML IV EMUL
INTRAVENOUS | Status: DC | PRN
Start: 2016-04-28 — End: 2016-04-28
  Administered 2016-04-28: 150 ug/kg/min via INTRAVENOUS
  Administered 2016-04-28: 100 ug/kg/min via INTRAVENOUS

## 2016-04-28 MED ORDER — EPHEDRINE SULFATE-NACL 50-0.9 MG/10ML-% IV SOSY
PREFILLED_SYRINGE | INTRAVENOUS | Status: DC | PRN
  Administered 2016-04-28: 10 mg via INTRAVENOUS

## 2016-04-28 MED ORDER — INSULIN ASPART 100 UNIT/ML ~~LOC~~ SOLN
15.0000 [IU] | Freq: Once | SUBCUTANEOUS | Status: AC
  Administered 2016-04-28: 15 [IU] via SUBCUTANEOUS
  Filled 2016-04-28: qty 0.15

## 2016-04-28 MED ORDER — SODIUM CHLORIDE 0.9 % IV SOLN: INTRAVENOUS | Status: DC

## 2016-04-28 MED ORDER — LACTATED RINGERS IV SOLN
INTRAVENOUS | Status: DC
  Administered 2016-04-28: 1000 mL via INTRAVENOUS

## 2016-04-28 MED ORDER — ONDANSETRON HCL 4 MG/2ML IJ SOLN
INTRAMUSCULAR | Status: DC | PRN
  Administered 2016-04-28: 4 mg via INTRAVENOUS

## 2016-04-28 MED ORDER — EPHEDRINE 5 MG/ML INJ
INTRAVENOUS | Status: AC
  Filled 2016-04-28: qty 10

## 2016-04-28 MED ORDER — PROPOFOL 10 MG/ML IV BOLUS
INTRAVENOUS | Status: DC | PRN
  Administered 2016-04-28 (×2): 20 mg via INTRAVENOUS
  Administered 2016-04-28: 30 mg via INTRAVENOUS
  Administered 2016-04-28 (×2): 20 mg via INTRAVENOUS

## 2016-04-28 NOTE — H&P (View-Only) (Signed)
04/19/2016 DABNEY DEVER [id number] [birth year]   HISTORY OF PRESENT ILLNESS:  This is a 71 year old female who presented to [facility] ED in January with obstructive jaundice.  Imaging suggested likely secondary to pancreatic cancer in uncinate process.  She is status post ERCP with biliary stent placement in January by Dr. Henrene Pastor. Liver tests improved/stable on 2/5.  Patient is here to discuss EUS.  Has IDDM and has home O2 but reportedly only uses that on occasion in the humid summer months.  She apparently signed herself out of rehab facility after one day, but multiple family members here today report that she is about at her baseline.  She feels ok.  No abdominal pain, nausea, vomiting.  Just very tired.    CA 19-9 is elevated at 107.   Past Medical History:  Diagnosis Date  . Anemia   . Cellulitis    LOWER EXTREMITIES  . Chronic diarrhea    a/w nausea - felt related to IBS  . Deaf    left side only  . Diastolic CHF ([place])   . Disc degeneration, lumbar   . GERD (gastroesophageal reflux disease)   . Hyperlipidemia    hx rhabdo on statins  . Hypertension   . Neuropathy (HCC)    feet, toes and fingers  . On home oxygen therapy    uses oxygen 2 liters min per [place] at night and prn during day  . OSA (obstructive sleep apnea)    05/2009 sleep study - refuses CPAP  . Osteoarthritis   . RLS (restless legs syndrome)   . Shortness of breath    chronic  . Stasis dermatitis   . Type II or unspecified type diabetes mellitus without mention of complication, not stated as uncontrolled    insulin dep   Past Surgical History:  Procedure Laterality Date  . CHOLECYSTECTOMY  1997  . COLONOSCOPY N/A 12/03/2012   Procedure: COLONOSCOPY;  Surgeon: Lafayette Dragon, MD;  Location: [facility];  Service: Endoscopy;  Laterality: N/A;  . ERCP N/A 03/18/2016   Procedure: ENDOSCOPIC RETROGRADE CHOLANGIOPANCREATOGRAPHY (ERCP);  Surgeon: Irene Shipper, MD;  Location: [facility];  Service: Endoscopy;   Laterality: N/A;  . TONSILLECTOMY  1970  . TUBAL LIGATION  1980  . UMBILICAL HERNIA REPAIR  1995  . uterine polyp removal  2008    reports that she has never smoked. She has never used smokeless tobacco. She reports that she does not drink alcohol or use drugs. family history includes Heart attack (age of onset: 14) in her mother; Heart attack (age of onset: 40) in her father; Heart disease in her father and mother; Lung cancer in her paternal grandfather; Stomach cancer in her paternal grandmother. Allergies  Allergen Reactions  . Cymbalta [Duloxetine Hcl] Other (See Comments)    Restless leg syndrome  . Statins Other (See Comments)    Statin drugs cause muscle pain / "muscle damage"--was told by MD not to take  . Sulfa Antibiotics Diarrhea  . Levemir [Insulin Detemir] Itching  . Morphine Other (See Comments)    GI upset and headaches  . Zinc Swelling and Rash      Outpatient Encounter Prescriptions as of 04/19/2016  Medication Sig  . aspirin EC 81 MG tablet Take 1 tablet (81 mg total) by mouth daily.  . carvedilol (COREG) 25 MG tablet TAKE 1 TABLET BY MOUTH TWICE DAILY WITH FOOD  . Cholecalciferol (VITAMIN D3) 2000 units capsule Take 1 capsule (2,000 Units total)  by mouth daily.  . citalopram (CELEXA) 20 MG tablet TAKE 1 TABLET (20 MG TOTAL) BY MOUTH DAILY.  . DULoxetine (CYMBALTA) 60 MG capsule Take 1 capsule (60 mg total) by mouth daily.  [name] ezetimibe (ZETIA) 10 MG tablet TAKE ONE TABLET BY MOUTH DAILY  . gabapentin (NEURONTIN) 300 MG capsule TAKE TWO CAPSULES BY MOUTH FOUR TIMES A DAY  . glucose blood (ONETOUCH VERIO) test strip Use as instructed up to 4 times per day  . hydrALAZINE (APRESOLINE) 25 MG tablet TAKE 1 TABLET (25 MG TOTAL) BY MOUTH 3 (THREE) TIMES DAILY.  [name] HYDROcodone-acetaminophen (NORCO) 10-325 MG tablet Take 1 tablet by mouth every 6 (six) hours as needed.  . insulin aspart (NOVOLOG) 100 UNIT/ML injection Inject 0-20 Units into the skin 3 (three) times daily with  meals. . CBG 70 - 120: 0 units CBG 121 - 150: 3 units CBG 151 - 200: 4 units CBG 201 - 250: 7 units CBG 251 - 300: 11 units CBG 301 - 350: 15 units CBG 351 - 400: 20 units  . insulin glargine (LANTUS) 100 UNIT/ML injection Inject 0.3 mLs (30 Units total) into the skin daily.  [name] LYRICA 200 MG capsule TAKE ONE CAPSULE BY MOUTH TWICE A DAY  . meloxicam (MOBIC) 7.5 MG tablet TAKE ONE TABLET BY MOUTH DAILY  . metFORMIN (GLUCOPHAGE-XR) 500 MG 24 hr tablet Take 1,000 mg by mouth daily with breakfast.   . metolazone (ZAROXOLYN) 2.5 MG tablet Take 1 tablet (2.5 mg total) by mouth 2 (two) times a week.  [name] rOPINIRole (REQUIP XL) 4 MG 24 hr tablet TAKE 1 TABLET (4 MG TOTAL) BY MOUTH AT BEDTIME.  [name] spironolactone (ALDACTONE) 25 MG tablet TAKE 1 TABLET BY MOUTH DAILY  . torsemide (DEMADEX) 100 MG tablet Take 1 tablet (100 mg total) by mouth daily.  . vitamin B-12 (CYANOCOBALAMIN) 500 MCG tablet Take 500 mcg by mouth daily. Reported on 09/09/2015  . [DISCONTINUED] meclizine (ANTIVERT) 25 MG tablet Take 25 mg by mouth 3 (three) times daily as needed for dizziness.   No facility-administered encounter medications on file as of 04/19/2016.      REVIEW OF SYSTEMS  : All other systems reviewed and negative except where noted in the History of Present Illness.   PHYSICAL EXAM: BP (!) 120/50   Pulse 76  General: Well developed white female in no acute distress; in wheelchair Head: Normocephalic and atraumatic Eyes:  Sclerae anicteric, conjunctiva pink. Ears: Normal auditory acuity Lungs: Clear throughout to auscultation Heart: Regular rate and rhythm Abdomen: Soft, non-distended. Normal bowel sounds. Non-tender. Musculoskeletal: Symmetrical with no gross deformities  Skin: No lesions on visible extremities Extremities:  Some swelling and skin changes c/w venous stasis in B/L LE's. Neurological: Alert oriented x 4, grossly non-focal Psychological:  Alert and cooperative. Normal mood and  affect  ASSESSMENT AND PLAN: #1. Obstructive jaundice likely secondary to pancreatic cancer status post ERCP with biliary stent placement in January. Liver tests improved/stable on 2/5.  Does not appear obstructed clinically today so will hold off on repeat LFT's for now. #2  IDDM #3  Occasional use of home O2:  Usually in the humid summer months.  *Will be scheduled for upper EUS with Dr. Ardis Hughs after the case was discussed with him today.  The risks, benefits, and alternatives to EUS were discussed with the patient and she consents to proceed.   CC:  [name], *

## 2016-04-28 NOTE — Interval H&P Note (Signed)
History and Physical Interval Note:  04/28/2016 9:41 AM  Melissa Myers  has presented today for surgery, with the diagnosis of pancreatic mass  The various methods of treatment have been discussed with the patient and family. After consideration of risks, benefits and other options for treatment, the patient has consented to  Procedure(s): UPPER ENDOSCOPIC ULTRASOUND (EUS) LINEAR (N/A) as a surgical intervention .  The patient's history has been reviewed, patient examined, no change in status, stable for surgery.  I have reviewed the patient's chart and labs.  Questions were answered to the patient's satisfaction.     [name]

## 2016-04-28 NOTE — Anesthesia Preprocedure Evaluation (Addendum)
Anesthesia Evaluation  Patient identified by MRN, date of birth, ID band Patient awake  General Assessment Comment:jaundiced  Reviewed: Allergy & Precautions, NPO status , reviewed documented beta blocker date and time   History of Anesthesia Complications Negative for: history of anesthetic complications  Airway Mallampati: III  TM Distance: <3 FB Neck ROM: Full  Mouth opening: Limited Mouth Opening  Dental  (+) Teeth Intact, Dental Advisory Given   Pulmonary sleep apnea , Recent URI ,    breath sounds clear to auscultation       Cardiovascular hypertension, +CHF   Rhythm:Regular Rate:Normal     Neuro/Psych PSYCHIATRIC DISORDERS Depression  Neuromuscular disease    GI/Hepatic GERD  ,  Endo/Other  diabetes, Poorly Controlled, Type 2, Insulin Dependent  Renal/GU Renal disease     Musculoskeletal  (+) Arthritis ,   Abdominal (+) + obese,   Peds  Hematology  (+) anemia ,   Anesthesia Other Findings   Reproductive/Obstetrics                            Anesthesia Physical  Anesthesia Plan  ASA: III  Anesthesia Plan: MAC   Post-op Pain Management:    Induction: Intravenous  Airway Management Planned: Nasal Cannula  Additional Equipment:   Intra-op Plan:   Post-operative Plan:   Informed Consent: I have reviewed the patients History and Physical, chart, labs and discussed the procedure including the risks, benefits and alternatives for the proposed anesthesia with the patient or authorized representative who has indicated his/her understanding and acceptance.   Dental advisory given  Plan Discussed with: CRNA  Anesthesia Plan Comments:       Anesthesia Quick Evaluation

## 2016-04-28 NOTE — Discharge Instructions (Signed)

## 2016-04-28 NOTE — Telephone Encounter (Signed)
Shana/scheduler called patient back and from description patient was giving---we feel she needs more aggressive intervention than oral abx---patient advised to go to hospital

## 2016-04-28 NOTE — Anesthesia Postprocedure Evaluation (Signed)
Anesthesia Post Note  Patient: Melissa Myers  Procedure(s) Performed: Procedure(s) (LRB): UPPER ENDOSCOPIC ULTRASOUND (EUS) LINEAR (N/A)  Patient location during evaluation: PACU Anesthesia Type: MAC Level of consciousness: awake and alert Pain management: pain level controlled Vital Signs Assessment: post-procedure vital signs reviewed and stable Respiratory status: spontaneous breathing, nonlabored ventilation, respiratory function stable and patient connected to nasal cannula oxygen Cardiovascular status: stable and blood pressure returned to baseline Anesthetic complications: no       Last Vitals:  Vitals:   04/28/16 1440 04/28/16 1450  BP: 117/66 133/84  Pulse: 72 71  Resp: 20 19  Temp:      Last Pain:  Vitals:   04/28/16 1421  TempSrc: Oral                 Lynda Rainwater

## 2016-04-28 NOTE — Op Note (Signed)
[facility] Patient Name: Melissa Myers Procedure Date: 04/28/2016 MRN: [id number] Attending MD: [name] , MD Date of Birth: 1945/08/18 CSN: [id number] Age: 71 Admit Type: Outpatient Procedure:                Upper EUS Indications:              Mass in pancreas on CT scan; double duct sign, ERCP                            Dr. Henrene Pastor 6 weeks ago with placement of plastic                            biliary stent across distal CBD stricture Providers:                [name], MD, Carolynn Comment, RN, Alfonso Patten, Technician, Adair Laundry, CRNA Referring MD:             Scarlette Shorts, MD Medicines:                Monitored Anesthesia Care Complications:            No immediate complications. Estimated blood loss:                            None. Estimated Blood Loss:     Estimated blood loss: none. Procedure:                Pre-Anesthesia Assessment:                           - Prior to the procedure, a History and Physical                            was performed, and patient medications and                            allergies were reviewed. The patient's tolerance of                            previous anesthesia was also reviewed. The risks                            and benefits of the procedure and the sedation                            options and risks were discussed with the patient.                            All questions were answered, and informed consent                            was obtained. Prior Anticoagulants: The patient has  taken no previous anticoagulant or antiplatelet                            agents. ASA Grade Assessment: III - A patient with                            severe systemic disease. After reviewing the risks                            and benefits, the patient was deemed in                            satisfactory condition to undergo the procedure.          After obtaining informed consent, the endoscope was                            passed under direct vision. Throughout the                            procedure, the patient's blood pressure, pulse, and                            oxygen saturations were monitored continuously. The                            [id number] ([id number]) scope was introduced through                            the mouth, and advanced to the second part of                            duodenum. The upper EUS was accomplished without                            difficulty. The patient tolerated the procedure                            well. Scope In: Scope Out: Findings:      Endoscopic Finding :      The examined esophagus was endoscopically normal.      The entire examined stomach was endoscopically normal.      The examined duodenum was endoscopically normal except for the       previously placed plastic biliary stent extending into the duodenum       across the major papilla.      Endosonographic Finding :      1. An irregular mass was identified in the uncinate process of the       pancreas. The mass was hypoechoic and heterogenous. The mass measured 29       mm in maximal cross-sectional diameter. The endosonographic borders were       irregular. There was no involvement of the SMA, SMV, PV or celiac trunk.       The remainder of the pancreas was examined. The endosonographic       appearance of parenchyma and the  upstream pancreatic duct indicated duct       dilation (to 30mm). Fine needle aspiration for cytology was performed.       Color Doppler imaging was utilized prior to needle puncture to confirm a       lack of significant vascular structures within the needle path. Two       passes were made with the 25 gauge needle using a transduodenal       approach. A cytotechnologist was present to evaluate the adequacy of the       specimen.      2. Previously placed plastic bile duct stent was in good  position.      3. No peripancreatic adenopathy      4. Limited views of liver, spleen, portal and splenic vessels were all       normal. Impression:               - Previously placed bile duct stent in good                            position (plastic).                           - 2.9 cm mass in the pancreatic uncinate causing                            main pancreatic duct and bile duct obstruction; the                            mass does not directly abut the SMA, SMV, PV or                            celiac trunk and there is no clear peripancreatic                            adenopathy on this exam. The mass was sampled with                            transduodenal FNA. 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 Await                            final cytology. Moderate Sedation:      N/A- Per Anesthesia Care Recommendation:           - Discharge patient to home (ambulatory).                           - Referrals to medical oncology. She seems to be a                            poor surgical candidate but will arrange referral                            to Dr. Barry Dienes to be  certain.                           - Await cytology results. Procedure Code(s):        --- Professional ---                           [id number], Esophagogastroduodenoscopy, flexible,                            transoral; with transendoscopic ultrasound-guided                            intramural or transmural fine needle                            aspiration/biopsy(s), (includes endoscopic                            ultrasound examination limited to the esophagus,                            stomach or duodenum, and adjacent structures) Diagnosis Code(s):        --- Professional ---                           K86.89, Other specified diseases of pancreas                           R93.3, Abnormal findings on diagnostic imaging of                            other  parts of digestive tract CPT copyright 2016 [employer]. All rights reserved. The codes documented in this report are preliminary and upon coder review may  be revised to meet current compliance requirements. [name], MD 04/28/2016 2:16:38 PM This report has been signed electronically. Number of Addenda: 0

## 2016-04-28 NOTE — Telephone Encounter (Signed)
Pt daughter called in and pt was just released from the surgical center and they told her to call her primary to get some antibiotic because pt had cellules in her leg.  She is wanting someone to call her back today about this   Best number [phone]

## 2016-04-28 NOTE — Transfer of Care (Signed)
Immediate Anesthesia Transfer of Care Note  Patient: Melissa Myers  Procedure(s) Performed: Procedure(s): UPPER ENDOSCOPIC ULTRASOUND (EUS) LINEAR (N/A)  Patient Location: PACU  Anesthesia Type:MAC  Level of Consciousness: sedated  Airway & Oxygen Therapy: Patient Spontanous Breathing and Patient connected to nasal cannula oxygen  Post-op Assessment: Report given to RN and Post -op Vital signs reviewed and stable  Post vital signs: Reviewed and stable  Last Vitals:  Vitals:   04/28/16 1012  BP: (!) 168/59  Resp: 18  Temp: 37 C    Last Pain:  Vitals:   04/28/16 1012  TempSrc: Oral         Complications: No apparent anesthesia complications

## 2016-05-01 ENCOUNTER — Encounter (HOSPITAL_COMMUNITY): Payer: Self-pay | Admitting: Gastroenterology

## 2016-05-02 ENCOUNTER — Telehealth: Payer: Self-pay | Admitting: Internal Medicine

## 2016-05-02 NOTE — Telephone Encounter (Signed)
Contacted patient husband and stated to call the surgeon since its been going on since the operation, and if no help then to call back and make an apt.

## 2016-05-02 NOTE — Telephone Encounter (Signed)
Pt's husband called wanting to see if Dr. Sharlet Salina or someone could call her regarding what is going on. He said that she is very bloated and in a lot of pain. She has been having issues since a pancreas operation. Please advise. Thanks [employer]

## 2016-05-04 ENCOUNTER — Inpatient Hospital Stay (HOSPITAL_COMMUNITY)
Admission: EM | Admit: 2016-05-04 | Discharge: 2016-05-13 | DRG: 919 | Disposition: A | Discharge status: Skilled Nursing Facility | Payer: PPO | Attending: Family Medicine | Admitting: Family Medicine

## 2016-05-04 ENCOUNTER — Encounter (HOSPITAL_COMMUNITY): Payer: Self-pay | Admitting: Emergency Medicine

## 2016-05-04 ENCOUNTER — Emergency Department (HOSPITAL_COMMUNITY): Payer: PPO

## 2016-05-04 ENCOUNTER — Telehealth: Payer: Self-pay | Admitting: Internal Medicine

## 2016-05-04 ENCOUNTER — Other Ambulatory Visit (HOSPITAL_COMMUNITY): Payer: Self-pay | Admitting: Vascular Surgery

## 2016-05-04 DIAGNOSIS — K830 Cholangitis: Secondary | ICD-10-CM | POA: Diagnosis not present

## 2016-05-04 DIAGNOSIS — Z885 Allergy status to narcotic agent status: Secondary | ICD-10-CM

## 2016-05-04 DIAGNOSIS — E1142 Type 2 diabetes mellitus with diabetic polyneuropathy: Secondary | ICD-10-CM | POA: Diagnosis present

## 2016-05-04 DIAGNOSIS — Z79899 Other long term (current) drug therapy: Secondary | ICD-10-CM

## 2016-05-04 DIAGNOSIS — K8309 Other cholangitis: Secondary | ICD-10-CM

## 2016-05-04 DIAGNOSIS — I959 Hypotension, unspecified: Secondary | ICD-10-CM | POA: Diagnosis present

## 2016-05-04 DIAGNOSIS — Z8249 Family history of ischemic heart disease and other diseases of the circulatory system: Secondary | ICD-10-CM

## 2016-05-04 DIAGNOSIS — N390 Urinary tract infection, site not specified: Secondary | ICD-10-CM | POA: Diagnosis present

## 2016-05-04 DIAGNOSIS — Z9981 Dependence on supplemental oxygen: Secondary | ICD-10-CM

## 2016-05-04 DIAGNOSIS — K219 Gastro-esophageal reflux disease without esophagitis: Secondary | ICD-10-CM | POA: Diagnosis not present

## 2016-05-04 DIAGNOSIS — C250 Malignant neoplasm of head of pancreas: Secondary | ICD-10-CM | POA: Diagnosis not present

## 2016-05-04 DIAGNOSIS — B952 Enterococcus as the cause of diseases classified elsewhere: Secondary | ICD-10-CM | POA: Diagnosis present

## 2016-05-04 DIAGNOSIS — G92 Toxic encephalopathy: Secondary | ICD-10-CM | POA: Diagnosis present

## 2016-05-04 DIAGNOSIS — E119 Type 2 diabetes mellitus without complications: Secondary | ICD-10-CM

## 2016-05-04 DIAGNOSIS — I5032 Chronic diastolic (congestive) heart failure: Secondary | ICD-10-CM | POA: Diagnosis present

## 2016-05-04 DIAGNOSIS — E785 Hyperlipidemia, unspecified: Secondary | ICD-10-CM | POA: Diagnosis present

## 2016-05-04 DIAGNOSIS — I872 Venous insufficiency (chronic) (peripheral): Secondary | ICD-10-CM | POA: Diagnosis present

## 2016-05-04 DIAGNOSIS — Z515 Encounter for palliative care: Secondary | ICD-10-CM | POA: Diagnosis not present

## 2016-05-04 DIAGNOSIS — Z794 Long term (current) use of insulin: Secondary | ICD-10-CM

## 2016-05-04 DIAGNOSIS — E871 Hypo-osmolality and hyponatremia: Secondary | ICD-10-CM | POA: Diagnosis not present

## 2016-05-04 DIAGNOSIS — Z791 Long term (current) use of non-steroidal anti-inflammatories (NSAID): Secondary | ICD-10-CM

## 2016-05-04 DIAGNOSIS — E876 Hypokalemia: Secondary | ICD-10-CM | POA: Diagnosis not present

## 2016-05-04 DIAGNOSIS — N179 Acute kidney failure, unspecified: Secondary | ICD-10-CM | POA: Diagnosis present

## 2016-05-04 DIAGNOSIS — Z9689 Presence of other specified functional implants: Secondary | ICD-10-CM | POA: Diagnosis present

## 2016-05-04 DIAGNOSIS — R739 Hyperglycemia, unspecified: Secondary | ICD-10-CM

## 2016-05-04 DIAGNOSIS — C259 Malignant neoplasm of pancreas, unspecified: Secondary | ICD-10-CM

## 2016-05-04 DIAGNOSIS — Z7189 Other specified counseling: Secondary | ICD-10-CM | POA: Diagnosis not present

## 2016-05-04 DIAGNOSIS — R410 Disorientation, unspecified: Secondary | ICD-10-CM

## 2016-05-04 DIAGNOSIS — E1165 Type 2 diabetes mellitus with hyperglycemia: Secondary | ICD-10-CM | POA: Diagnosis not present

## 2016-05-04 DIAGNOSIS — Z882 Allergy status to sulfonamides status: Secondary | ICD-10-CM

## 2016-05-04 DIAGNOSIS — IMO0001 Reserved for inherently not codable concepts without codable children: Secondary | ICD-10-CM

## 2016-05-04 DIAGNOSIS — K869 Disease of pancreas, unspecified: Secondary | ICD-10-CM | POA: Diagnosis not present

## 2016-05-04 DIAGNOSIS — R51 Headache: Secondary | ICD-10-CM | POA: Diagnosis not present

## 2016-05-04 DIAGNOSIS — D649 Anemia, unspecified: Secondary | ICD-10-CM | POA: Diagnosis present

## 2016-05-04 DIAGNOSIS — B961 Klebsiella pneumoniae [K. pneumoniae] as the cause of diseases classified elsewhere: Secondary | ICD-10-CM | POA: Diagnosis present

## 2016-05-04 DIAGNOSIS — R509 Fever, unspecified: Secondary | ICD-10-CM

## 2016-05-04 DIAGNOSIS — G2581 Restless legs syndrome: Secondary | ICD-10-CM | POA: Diagnosis present

## 2016-05-04 DIAGNOSIS — B9689 Other specified bacterial agents as the cause of diseases classified elsewhere: Secondary | ICD-10-CM | POA: Diagnosis present

## 2016-05-04 DIAGNOSIS — R34 Anuria and oliguria: Secondary | ICD-10-CM | POA: Diagnosis not present

## 2016-05-04 DIAGNOSIS — K831 Obstruction of bile duct: Secondary | ICD-10-CM | POA: Diagnosis present

## 2016-05-04 DIAGNOSIS — R001 Bradycardia, unspecified: Secondary | ICD-10-CM | POA: Diagnosis not present

## 2016-05-04 DIAGNOSIS — E108 Type 1 diabetes mellitus with unspecified complications: Secondary | ICD-10-CM | POA: Diagnosis not present

## 2016-05-04 DIAGNOSIS — K8689 Other specified diseases of pancreas: Secondary | ICD-10-CM

## 2016-05-04 DIAGNOSIS — Z6841 Body Mass Index (BMI) 40.0 and over, adult: Secondary | ICD-10-CM

## 2016-05-04 DIAGNOSIS — I10 Essential (primary) hypertension: Secondary | ICD-10-CM | POA: Diagnosis present

## 2016-05-04 DIAGNOSIS — Z800 Family history of malignant neoplasm of digestive organs: Secondary | ICD-10-CM

## 2016-05-04 DIAGNOSIS — T85590A Other mechanical complication of bile duct prosthesis, initial encounter: Secondary | ICD-10-CM | POA: Diagnosis not present

## 2016-05-04 DIAGNOSIS — H9192 Unspecified hearing loss, left ear: Secondary | ICD-10-CM | POA: Diagnosis present

## 2016-05-04 DIAGNOSIS — K589 Irritable bowel syndrome without diarrhea: Secondary | ICD-10-CM | POA: Diagnosis not present

## 2016-05-04 DIAGNOSIS — G4733 Obstructive sleep apnea (adult) (pediatric): Secondary | ICD-10-CM | POA: Diagnosis present

## 2016-05-04 DIAGNOSIS — Z7982 Long term (current) use of aspirin: Secondary | ICD-10-CM

## 2016-05-04 DIAGNOSIS — I110 Hypertensive heart disease with heart failure: Secondary | ICD-10-CM | POA: Diagnosis not present

## 2016-05-04 DIAGNOSIS — Z888 Allergy status to other drugs, medicaments and biological substances status: Secondary | ICD-10-CM

## 2016-05-04 DIAGNOSIS — Z9049 Acquired absence of other specified parts of digestive tract: Secondary | ICD-10-CM

## 2016-05-04 DIAGNOSIS — E860 Dehydration: Secondary | ICD-10-CM | POA: Diagnosis present

## 2016-05-04 LAB — CBG MONITORING, ED: GLUCOSE-CAPILLARY: 403 mg/dL — AB (ref 65–99)

## 2016-05-04 NOTE — ED Provider Notes (Signed)
[facility] DEPT Provider Note   CSN: [id number] Arrival date & time: 05/04/16  2328   By signing my name below, I, Delton Prairie, attest that this documentation has been prepared under the direction and in the presence of Ripley Fraise, MD  Electronically Signed: Delton Prairie, ED Scribe. 05/04/16. 11:48 PM.   History   Chief Complaint Chief Complaint  Patient presents with  . Headache  . Hyperglycemia    Headache   This is a new problem. The current episode started 1 to 2 hours ago. The problem has not changed since onset.The headache is associated with an unknown factor.  Hyperglycemia  Severity:  Moderate Onset quality:  Unable to specify Timing:  Unable to specify Progression:  Unable to specify Chronicity:  New Relieved by:  Nothing Ineffective treatments:  None tried  HPI Comments: LEVEL 5 CAVEAT DUE TO Melissa Myers is a 71 y.o. female, with a PMHx of hyperlipidemia, DM, HTN and CHF, who presents to the Emergency Department complaining of a headache onset PTA. Pt also has elevated blood sugar according to EMS. Nurse notes the EMS stated the pt was complaining of abdominal pain and nausea en route to the ED. Pt was given a Vicodin by her husband 2 hours ago. No other modifying factors noted. Pt denies back pain, chest pain or any other associated symptoms. No other complaints noted.  She is unable to provide much detail due to confusion Past Medical History:  Diagnosis Date  . Anemia   . Cellulitis    LOWER EXTREMITIES  . Chronic diarrhea    a/w nausea - felt related to IBS  . Deaf    left side only  . Diastolic CHF ([place])   . Disc degeneration, lumbar   . GERD (gastroesophageal reflux disease)   . Hyperlipidemia    hx rhabdo on statins  . Hypertension   . Neuropathy (HCC)    feet, toes and fingers  . On home oxygen therapy    uses oxygen 2 liters min per [place] at night and prn during day  . OSA (obstructive sleep apnea)    05/2009 sleep study - refuses CPAP  . Osteoarthritis   . RLS (restless legs syndrome)   . Shortness of breath    chronic  . Stasis dermatitis   . Type II or unspecified type diabetes mellitus without mention of complication, not stated as uncontrolled    insulin dep    Patient Active Problem List   Diagnosis Date Noted  . Pancreatic mass   . Bile duct stricture   . Obstructive jaundice   . Abnormal CT of the abdomen   . Jaundice 03/15/2016  . Hyperbilirubinemia 03/15/2016  . Diabetic peripheral neuropathy ([place]) 02/23/2016  . History of fall 10/22/2015  . Bradycardia 10/09/2015  . Left foot pain 07/10/2015  . Murmur, cardiac 07/02/2015  . Chronic pain syndrome 05/05/2015  . MDD (major depressive disorder), single episode, severe ([place]) 05/05/2015  . Chronic anemia 04/14/2015  . IDDM (insulin dependent diabetes mellitus) ([place]) 03/19/2015  . Rectal bleeding 03/13/2015  . Hyperlipidemia 12/23/2014  . Deficiency anemia 07/26/2012  . Venous stasis ulcers of both lower extremities ([place]) 01/04/2011  . INSOMNIA 08/03/2009  . Chronic diastolic heart failure ([place]) 06/01/2009  . Vitamin D deficiency 05/12/2009  . Sleep apnea 04/29/2009  . Dyslipidemia 04/20/2009  . Morbid obesity ([place]) 04/20/2009  . RESTLESS LEGS SYNDROME 04/20/2009  . Peripheral neuropathy ([place]) 04/20/2009  . Essential hypertension 04/20/2009  .  Low back pain 04/20/2009    Past Surgical History:  Procedure Laterality Date  . CHOLECYSTECTOMY  1997  . COLONOSCOPY N/A 12/03/2012   Procedure: COLONOSCOPY;  Surgeon: Lafayette Dragon, MD;  Location: [facility] ENDOSCOPY;  Service: Endoscopy;  Laterality: N/A;  . ERCP N/A 03/18/2016   Procedure: ENDOSCOPIC RETROGRADE CHOLANGIOPANCREATOGRAPHY (ERCP);  Surgeon: Irene Shipper, MD;  Location: [facility];  Service: Endoscopy;  Laterality: N/A;  . EUS N/A 04/28/2016   Procedure: UPPER ENDOSCOPIC ULTRASOUND (EUS) LINEAR;  Surgeon: [name], MD;  Location: [facility] ENDOSCOPY;  Service:  Endoscopy;  Laterality: N/A;  . TONSILLECTOMY  1970  . TUBAL LIGATION  1980  . UMBILICAL HERNIA REPAIR  1995  . uterine polyp removal  2008    OB History    No data available       Home Medications    Prior to Admission medications   Medication Sig Start Date End Date Taking? Authorizing Provider  aspirin EC 81 MG tablet Take 1 tablet (81 mg total) by mouth daily. 07/08/14   Rowe Clack, MD  carvedilol (COREG) 25 MG tablet TAKE 1 TABLET BY MOUTH TWICE DAILY WITH FOOD 01/20/16   Jolaine Artist, MD  Cholecalciferol (VITAMIN D3) 2000 units capsule Take 1 capsule (2,000 Units total) by mouth daily. 06/17/15   Janith Lima, MD  citalopram (CELEXA) 20 MG tablet TAKE 1 TABLET (20 MG TOTAL) BY MOUTH DAILY. 01/27/16   [name], MD  DULoxetine (CYMBALTA) 60 MG capsule Take 1 capsule (60 mg total) by mouth daily. 09/25/15   [name], MD  ezetimibe (ZETIA) 10 MG tablet TAKE ONE TABLET BY MOUTH DAILY 02/03/16   [name], MD  gabapentin (NEURONTIN) 300 MG capsule TAKE TWO CAPSULES BY MOUTH FOUR TIMES A DAY 01/19/16   [name], MD  glucose blood (ONETOUCH VERIO) test strip Use as instructed up to 4 times per day 10/23/15   [name], MD  hydrALAZINE (APRESOLINE) 25 MG tablet TAKE 1 TABLET (25 MG TOTAL) BY MOUTH 3 (THREE) TIMES DAILY. 02/16/16   Jolaine Artist, MD  HYDROcodone-acetaminophen (NORCO) 10-325 MG tablet Take 1 tablet by mouth every 6 (six) hours as needed. 04/08/16   [name], MD  insulin aspart (NOVOLOG) 100 UNIT/ML injection Inject 0-20 Units into the skin 3 (three) times daily with meals. . CBG 70 - 120: 0 units CBG 121 - 150: 3 units CBG 151 - 200: 4 units CBG 201 - 250: 7 units CBG 251 - 300: 11 units CBG 301 - 350: 15 units CBG 351 - 400: 20 units 03/21/16   Mariel Aloe, MD  insulin glargine (LANTUS) 100 UNIT/ML injection Inject 0.3 mLs (30 Units total) into the skin daily. 03/22/16   Mariel Aloe,  MD  LYRICA 200 MG capsule TAKE ONE CAPSULE BY MOUTH TWICE A DAY 04/07/16   [name], MD  meloxicam (MOBIC) 7.5 MG tablet TAKE ONE TABLET BY MOUTH DAILY 03/11/16   [name], MD  metFORMIN (GLUCOPHAGE-XR) 500 MG 24 hr tablet Take 1,000 mg by mouth daily with breakfast.     Historical Provider, MD  metolazone (ZAROXOLYN) 2.5 MG tablet Take 1 tablet (2.5 mg total) by mouth 2 (two) times a week. 12/17/15   Chasitie Friar, PA-C  rOPINIRole (REQUIP XL) 4 MG 24 hr tablet TAKE 1 TABLET (4 MG TOTAL) BY MOUTH AT BEDTIME. 02/23/16   [name], MD  spironolactone (ALDACTONE) 25  MG tablet TAKE ONE TABLET EACH DAY 04/21/16   Larey Dresser, MD  torsemide (DEMADEX) 100 MG tablet Take 1 tablet (100 mg total) by mouth daily. 03/21/16   Mariel Aloe, MD  vitamin B-12 (CYANOCOBALAMIN) 500 MCG tablet Take 500 mcg by mouth daily. Reported on 09/09/2015    Historical Provider, MD    Family History Family History  Problem Relation Age of Onset  . Heart disease Mother   . Heart attack Mother 20  . Heart disease Father   . Heart attack Father 53  . Heart disease      family history  . Stomach cancer Paternal Grandmother   . Lung cancer Paternal Grandfather   . CVA      several aunts  . Heart attack      several aunts and an uncle  . Colon cancer Neg Hx     Social History Social History  Substance Use Topics  . Smoking status: Never Smoker  . Smokeless tobacco: Never Used  . Alcohol use No     Allergies   Cymbalta [duloxetine hcl]; Statins; Sulfa antibiotics; Levemir [insulin detemir]; Morphine; and Zinc   Review of Systems Review of Systems  Unable to perform ROS: Mental status change   Physical Exam Updated Vital Signs BP (!) 132/53 (BP Location: Right Arm)   Pulse 67   Temp 98.6 F (37 C) (Oral)   Resp 18   SpO2 95%   Physical Exam CONSTITUTIONAL: Elderly and chronically ill appearing  HEAD: Normocephalic/atraumatic EYES: EOMI/PERRL. Pupils not  pinpoint  ENMT: Mucous membranes dry NECK: supple no meningeal signs SPINE/BACK:entire spine nontender CV: S1/S2 noted, no murmurs/rubs/gallops noted LUNGS: crackles bilaterally ABDOMEN: soft, nontender, no rebound or guarding, bowel sounds noted throughout abdomen GU:no cva tenderness NEURO: Pt is awake and alert but appears confused when asked simple questions about date and current events. Moves all extremities x 4 but appears weak. Symmetric bilateral lower extremity edema  She moans frequently throughout exam EXTREMITIES: pulses normal/equal, full ROM SKIN: warm, color normal PSYCH: unable to assess   ED Treatments / Results  DIAGNOSTIC STUDIES:  Oxygen Saturation is 95% on RA, normal by my interpretation.    COORDINATION OF CARE:  11:42 PM Discussed treatment plan with pt at bedside and pt agreed to plan.  Labs (all labs ordered are listed, but only abnormal results are displayed) Labs Reviewed  CBC - Abnormal; Notable for the following:       Result Value   WBC 11.0 (*)    Hemoglobin 11.4 (*)    HCT 33.9 (*)    RDW 17.1 (*)    All other components within normal limits  DIFFERENTIAL - Abnormal; Notable for the following:    Neutro Abs 8.1 (*)    Monocytes Absolute 1.1 (*)    All other components within normal limits  COMPREHENSIVE METABOLIC PANEL - Abnormal; Notable for the following:    Sodium 131 (*)    Chloride 86 (*)    Glucose, Bld 416 (*)    BUN 24 (*)    Creatinine, Ser 1.38 (*)    Calcium 8.5 (*)    Albumin 2.4 (*)    AST 127 (*)    ALT 69 (*)    Alkaline Phosphatase 752 (*)    Total Bilirubin 3.7 (*)    GFR calc non Af Amer 38 (*)    GFR calc Af Amer 44 (*)    All other components within normal limits  RAPID URINE  DRUG SCREEN, HOSP PERFORMED - Abnormal; Notable for the following:    Opiates POSITIVE (*)    All other components within normal limits  AMMONIA - Abnormal; Notable for the following:    Ammonia 51 (*)    All other components within  normal limits  URINALYSIS, ROUTINE W REFLEX MICROSCOPIC - Abnormal; Notable for the following:    Glucose, UA >=500 (*)    Nitrite POSITIVE (*)    Leukocytes, UA TRACE (*)    Squamous Epithelial / LPF 0-5 (*)    All other components within normal limits  CBG MONITORING, ED - Abnormal; Notable for the following:    Glucose-Capillary 403 (*)    All other components within normal limits  I-STAT VENOUS BLOOD GAS, ED - Abnormal; Notable for the following:    pH, Ven 7.587 (*)    pCO2, Ven 35.4 (*)    Bicarbonate 33.7 (*)    Acid-Base Excess 11.0 (*)    All other components within normal limits  CULTURE, BLOOD (ROUTINE X 2)  CULTURE, BLOOD (ROUTINE X 2)  URINE CULTURE  ETHANOL  PROTIME-INR  APTT  TROPONIN I  LIPASE, BLOOD  SALICYLATE LEVEL  ACETAMINOPHEN LEVEL  BLOOD GAS, VENOUS  I-STAT CG4 LACTIC ACID, ED    EKG  EKG Interpretation  Date/Time:  Wednesday May 04 2016 23:42:31 EDT Ventricular Rate:  68 PR Interval:    QRS Duration: 141 QT Interval:  448 QTC Calculation: 477 R Axis:   -148 Text Interpretation:  Sinus rhythm Right bundle branch block No significant change since last tracing Confirmed by Christy Gentles  MD, Jany Buckwalter ([id number]) on 05/04/2016 11:47:54 PM       Radiology Ct Head Wo Contrast  Result Date: 05/05/2016 CLINICAL DATA:  71 year old female with headache and confusion. EXAM: CT HEAD WITHOUT CONTRAST TECHNIQUE: Contiguous axial images were obtained from the base of the skull through the vertex without intravenous contrast. COMPARISON:  Head CT dated 10/22/2014 FINDINGS: Brain: The ventricles and sulci appropriate in size for patient's age. Minimal periventricular and deep white matter chronic microvascular ischemic changes noted. There is no acute intracranial hemorrhage. No mass effect or midline shift noted. There is no extra-axial fluid collection. Vascular: No hyperdense vessel or unexpected calcification. Skull: Normal. Negative for fracture or focal lesion.  Sinuses/Orbits: No acute finding. Other: None. IMPRESSION: No acute intracranial pathology. Electronically Signed   By: Anner Crete M.D.   On: 05/05/2016 01:21   Ct Abdomen Pelvis W Contrast  Result Date: 05/05/2016 CLINICAL DATA:  Epigastric pain. Hx diabetes, GERD, lumbar disc degeneration, chronic diarrhea, uterine polyp removal, umbilical hernia, tubal ligation, and cholecystectomy. EXAM: CT ABDOMEN AND PELVIS WITH CONTRAST TECHNIQUE: Multidetector CT imaging of the abdomen and pelvis was performed using the standard protocol following bolus administration of intravenous contrast. CONTRAST:  184mL ISOVUE-300 IOPAMIDOL (ISOVUE-300) INJECTION 61% COMPARISON:  ERCP 03/18/2016.  MRI 03/16/2016.  CT 03/15/2016. FINDINGS: Lower chest: Scattered emphysematous changes in the lung bases. Fibrosis or atelectasis in the lung bases. Calcification in the mitral valve annulus and coronary arteries. Hepatobiliary: Since the previous study, there is interval placement of a bile duct stent. Mild residual bile duct dilatation is less prominent than on prior study. Surgical absence of the gallbladder. No focal liver lesions. Pancreas: Diffuse pancreatic ductal dilatation with pancreatic parenchymal atrophy. Mass lesion demonstrated in the head of the pancreas on previous MRI is not well visualized at CT. No inflammatory stranding. Spleen: Focal lesion in the spleen measuring about 6 mm diameter is unchanged  since previous study. This probably represents a small cyst. Additional subcentimeter lesion in the upper spleen is also unchanged. Adrenals/Urinary Tract: Adrenal glands are unremarkable. Kidneys are normal, without renal calculi, focal lesion, or hydronephrosis. Bladder is unremarkable. Stomach/Bowel: Stomach and small bowel are mostly decompressed. No small bowel distention or inflammatory infiltration. Colon is diffusely stool-filled without abnormal distention or inflammation. Scattered colonic diverticula.  Appendix is not identified. Vascular/Lymphatic: Aortic atherosclerosis. No enlarged abdominal or pelvic lymph nodes. Reproductive: Uterus and bilateral adnexa are unremarkable. Other: Small left inguinal hernia containing fat. No free air or free fluid in the abdomen. Musculoskeletal: Degenerative changes in the spine. No destructive bone lesions. IMPRESSION: Interval placement of a bile duct stent with some decompression of the bile ducts. Mild residual biliary dilatation. Pancreatic ductal dilatation and atrophy. Known pancreatic mass lesion is not well depicted at CT. No evidence of bowel obstruction or inflammation. Electronically Signed   By: Lucienne Capers M.D.   On: 05/05/2016 04:37   Dg Chest Port 1 View  Result Date: 05/05/2016 CLINICAL DATA:  Confusion, hyperglycemia, and headache tonight EXAM: PORTABLE CHEST 1 VIEW COMPARISON:  03/28/2016 FINDINGS: Shallow inspiration. The heart size and mediastinal contours are within normal limits. Both lungs are clear. The visualized skeletal structures are unremarkable. IMPRESSION: No active disease. Electronically Signed   By: Lucienne Capers M.D.   On: 05/05/2016 00:04    Procedures Procedures    Medications Ordered in ED Medications  acetaminophen (TYLENOL) tablet 650 mg (650 mg Oral Given 05/05/16 0222)  cefTRIAXone (ROCEPHIN) 1 g in dextrose 5 % 50 mL IVPB (0 g Intravenous Stopped 05/05/16 0252)  sodium chloride 0.9 % bolus 1,000 mL (1,000 mLs Intravenous New Bag/Given 05/05/16 0222)  piperacillin-tazobactam (ZOSYN) IVPB 3.375 g (3.375 g Intravenous New Bag/Given 05/05/16 0445)  iopamidol (ISOVUE-300) 61 % injection (100 mLs  Contrast Given 05/05/16 0408)     Initial Impression / Assessment and Plan / ED Course  I have reviewed the triage vital signs and the nursing notes.  Pertinent labs & imaging results that were available during my care of the patient were reviewed by me and considered in my medical decision making (see chart for  details).     11:51 PM Pt with h/o poorly controlled DM here with multiple complaints but appears altered Per records, she has h/o pancreatic mass with obstructive physiology Labs/imaging ordered Awaiting family to arrive to provide details 12:29 AM Husband called in Apparently she reported HA and became confused which is new for her Will add on CT head No gross deficits at this time to suggest CVA 2:11 AM Pt stable She is now noted to have rectal temp Suspect uti Will treat and admit /3:25 AM Pt stable Awake/alert but still appears confused Neck is supple She now has epigastric and RUQ tenderness with worsening LFTs She has had obstructive jaundice before and has biliary stent in place Will get CT imaging Will add on zosyn and check lactate She does not have convincing signs of UTI 5:28 AM Pt more awake/alert She can tell me her name/birthdate/where she lives and who is president (earlier she was unable to recall most details) She denies HA CT abd/pelvis unrevealing However given presentation of fever/confusion, will admit Currently no HA, no meningeal signs and confusion resolved, I doubt meningitis  D/w dr danford for admission  Final Clinical Impressions(s) / ED Diagnoses   Final diagnoses:  Delirium  Hyperglycemia  Acute febrile illness    New Prescriptions New Prescriptions  No medications on file  I personally performed the services described in this documentation, which was scribed in my presence. The recorded information has been reviewed and is accurate.        Ripley Fraise, MD 05/05/16 0530

## 2016-05-04 NOTE — ED Triage Notes (Signed)
PT ARRIVED VIA EMS FROM HOME WITH C/O HEADACHE. Pt also abdominal pain and nausea. Given 4mg  zofran with EMS. Also found to be hyperglycemic.

## 2016-05-04 NOTE — Telephone Encounter (Signed)
Patient has recently been diagnosed with pancreatic cancer.  Patient needs assistance with medications, daily activities such as bathing.  Is requesting a referral for Home Health Aid.  Please follow up in regard.

## 2016-05-04 NOTE — Telephone Encounter (Signed)
Patient contacted and stated that they have found someone that is willing to help her so she is no longer in need of referral, stated to call back if she changes mind

## 2016-05-04 NOTE — Telephone Encounter (Signed)
I'm not sure the best way to get just an aide, but can put referral to hospice as they do have these services.

## 2016-05-04 NOTE — ED Notes (Signed)
Checked CBG 403 RN informed

## 2016-05-04 NOTE — ED Notes (Signed)
Per EMS pt husband gave her a vicodin for headache approx. 2 hours ago.

## 2016-05-05 ENCOUNTER — Emergency Department (HOSPITAL_COMMUNITY): Payer: PPO

## 2016-05-05 ENCOUNTER — Encounter (HOSPITAL_COMMUNITY): Payer: Self-pay | Admitting: Family Medicine

## 2016-05-05 DIAGNOSIS — C259 Malignant neoplasm of pancreas, unspecified: Secondary | ICD-10-CM | POA: Diagnosis not present

## 2016-05-05 DIAGNOSIS — K830 Cholangitis: Secondary | ICD-10-CM | POA: Diagnosis not present

## 2016-05-05 DIAGNOSIS — R41841 Cognitive communication deficit: Secondary | ICD-10-CM | POA: Diagnosis not present

## 2016-05-05 DIAGNOSIS — K589 Irritable bowel syndrome without diarrhea: Secondary | ICD-10-CM | POA: Diagnosis not present

## 2016-05-05 DIAGNOSIS — N179 Acute kidney failure, unspecified: Secondary | ICD-10-CM | POA: Diagnosis not present

## 2016-05-05 DIAGNOSIS — Z515 Encounter for palliative care: Secondary | ICD-10-CM | POA: Diagnosis not present

## 2016-05-05 DIAGNOSIS — R17 Unspecified jaundice: Secondary | ICD-10-CM | POA: Diagnosis not present

## 2016-05-05 DIAGNOSIS — E1165 Type 2 diabetes mellitus with hyperglycemia: Secondary | ICD-10-CM | POA: Diagnosis not present

## 2016-05-05 DIAGNOSIS — K831 Obstruction of bile duct: Secondary | ICD-10-CM | POA: Diagnosis not present

## 2016-05-05 DIAGNOSIS — K869 Disease of pancreas, unspecified: Secondary | ICD-10-CM

## 2016-05-05 DIAGNOSIS — C801 Malignant (primary) neoplasm, unspecified: Secondary | ICD-10-CM | POA: Diagnosis not present

## 2016-05-05 DIAGNOSIS — Z794 Long term (current) use of insulin: Secondary | ICD-10-CM

## 2016-05-05 DIAGNOSIS — E1142 Type 2 diabetes mellitus with diabetic polyneuropathy: Secondary | ICD-10-CM

## 2016-05-05 DIAGNOSIS — R34 Anuria and oliguria: Secondary | ICD-10-CM | POA: Diagnosis not present

## 2016-05-05 DIAGNOSIS — E785 Hyperlipidemia, unspecified: Secondary | ICD-10-CM | POA: Diagnosis not present

## 2016-05-05 DIAGNOSIS — H9192 Unspecified hearing loss, left ear: Secondary | ICD-10-CM | POA: Diagnosis not present

## 2016-05-05 DIAGNOSIS — G4730 Sleep apnea, unspecified: Secondary | ICD-10-CM | POA: Diagnosis not present

## 2016-05-05 DIAGNOSIS — Z6841 Body Mass Index (BMI) 40.0 and over, adult: Secondary | ICD-10-CM | POA: Diagnosis not present

## 2016-05-05 DIAGNOSIS — I110 Hypertensive heart disease with heart failure: Secondary | ICD-10-CM | POA: Diagnosis not present

## 2016-05-05 DIAGNOSIS — G894 Chronic pain syndrome: Secondary | ICD-10-CM | POA: Diagnosis not present

## 2016-05-05 DIAGNOSIS — E871 Hypo-osmolality and hyponatremia: Secondary | ICD-10-CM | POA: Diagnosis not present

## 2016-05-05 DIAGNOSIS — K630 Abscess of intestine: Secondary | ICD-10-CM | POA: Diagnosis not present

## 2016-05-05 DIAGNOSIS — C250 Malignant neoplasm of head of pancreas: Secondary | ICD-10-CM | POA: Diagnosis not present

## 2016-05-05 DIAGNOSIS — J90 Pleural effusion, not elsewhere classified: Secondary | ICD-10-CM | POA: Diagnosis not present

## 2016-05-05 DIAGNOSIS — F339 Major depressive disorder, recurrent, unspecified: Secondary | ICD-10-CM | POA: Diagnosis not present

## 2016-05-05 DIAGNOSIS — R509 Fever, unspecified: Secondary | ICD-10-CM

## 2016-05-05 DIAGNOSIS — M6281 Muscle weakness (generalized): Secondary | ICD-10-CM | POA: Diagnosis not present

## 2016-05-05 DIAGNOSIS — I5032 Chronic diastolic (congestive) heart failure: Secondary | ICD-10-CM | POA: Diagnosis not present

## 2016-05-05 DIAGNOSIS — I10 Essential (primary) hypertension: Secondary | ICD-10-CM

## 2016-05-05 DIAGNOSIS — I959 Hypotension, unspecified: Secondary | ICD-10-CM | POA: Diagnosis not present

## 2016-05-05 DIAGNOSIS — K219 Gastro-esophageal reflux disease without esophagitis: Secondary | ICD-10-CM | POA: Diagnosis not present

## 2016-05-05 DIAGNOSIS — R197 Diarrhea, unspecified: Secondary | ICD-10-CM | POA: Diagnosis not present

## 2016-05-05 DIAGNOSIS — N390 Urinary tract infection, site not specified: Secondary | ICD-10-CM | POA: Diagnosis not present

## 2016-05-05 DIAGNOSIS — T85590A Other mechanical complication of bile duct prosthesis, initial encounter: Secondary | ICD-10-CM | POA: Diagnosis not present

## 2016-05-05 DIAGNOSIS — Z9981 Dependence on supplemental oxygen: Secondary | ICD-10-CM | POA: Diagnosis not present

## 2016-05-05 DIAGNOSIS — R630 Anorexia: Secondary | ICD-10-CM | POA: Diagnosis not present

## 2016-05-05 DIAGNOSIS — R51 Headache: Secondary | ICD-10-CM | POA: Diagnosis not present

## 2016-05-05 DIAGNOSIS — R739 Hyperglycemia, unspecified: Secondary | ICD-10-CM

## 2016-05-05 DIAGNOSIS — R410 Disorientation, unspecified: Secondary | ICD-10-CM | POA: Diagnosis not present

## 2016-05-05 DIAGNOSIS — G2581 Restless legs syndrome: Secondary | ICD-10-CM | POA: Diagnosis not present

## 2016-05-05 DIAGNOSIS — C251 Malignant neoplasm of body of pancreas: Secondary | ICD-10-CM | POA: Diagnosis not present

## 2016-05-05 DIAGNOSIS — R2689 Other abnormalities of gait and mobility: Secondary | ICD-10-CM | POA: Diagnosis not present

## 2016-05-05 DIAGNOSIS — K838 Other specified diseases of biliary tract: Secondary | ICD-10-CM | POA: Diagnosis not present

## 2016-05-05 DIAGNOSIS — E46 Unspecified protein-calorie malnutrition: Secondary | ICD-10-CM | POA: Diagnosis not present

## 2016-05-05 DIAGNOSIS — G4733 Obstructive sleep apnea (adult) (pediatric): Secondary | ICD-10-CM | POA: Diagnosis not present

## 2016-05-05 DIAGNOSIS — Z7189 Other specified counseling: Secondary | ICD-10-CM | POA: Diagnosis not present

## 2016-05-05 DIAGNOSIS — K8309 Other cholangitis: Secondary | ICD-10-CM | POA: Diagnosis present

## 2016-05-05 DIAGNOSIS — E119 Type 2 diabetes mellitus without complications: Secondary | ICD-10-CM | POA: Diagnosis not present

## 2016-05-05 DIAGNOSIS — G92 Toxic encephalopathy: Secondary | ICD-10-CM | POA: Diagnosis not present

## 2016-05-05 LAB — COMPREHENSIVE METABOLIC PANEL
ALK PHOS: 752 U/L — AB (ref 38–126)
ALT: 69 U/L — ABNORMAL HIGH (ref 14–54)
ANION GAP: 15 (ref 5–15)
AST: 127 U/L — ABNORMAL HIGH (ref 15–41)
Albumin: 2.4 g/dL — ABNORMAL LOW (ref 3.5–5.0)
BUN: 24 mg/dL — ABNORMAL HIGH (ref 6–20)
CALCIUM: 8.5 mg/dL — AB (ref 8.9–10.3)
CHLORIDE: 86 mmol/L — AB (ref 101–111)
CO2: 30 mmol/L (ref 22–32)
Creatinine, Ser: 1.38 mg/dL — ABNORMAL HIGH (ref 0.44–1.00)
GFR calc non Af Amer: 38 mL/min — ABNORMAL LOW (ref >60)
GFR, EST AFRICAN AMERICAN: 44 mL/min — AB (ref >60)
Glucose, Bld: 416 mg/dL — ABNORMAL HIGH (ref 65–99)
POTASSIUM: 4.3 mmol/L (ref 3.5–5.1)
SODIUM: 131 mmol/L — AB (ref 135–145)
Total Bilirubin: 3.7 mg/dL — ABNORMAL HIGH (ref 0.3–1.2)
Total Protein: 6.8 g/dL (ref 6.5–8.1)

## 2016-05-05 LAB — URINALYSIS, ROUTINE W REFLEX MICROSCOPIC
BACTERIA UA: NONE SEEN
Bilirubin Urine: NEGATIVE
Glucose, UA: >=500 mg/dL — AB
Hgb urine dipstick: NEGATIVE
KETONES UR: NEGATIVE mg/dL
Nitrite: POSITIVE — AB
Protein, ur: NEGATIVE mg/dL
SPECIFIC GRAVITY, URINE: 1.007 (ref 1.005–1.030)
pH: 5 (ref 5.0–8.0)

## 2016-05-05 LAB — GLUCOSE, CAPILLARY
GLUCOSE-CAPILLARY: 335 mg/dL — AB (ref 65–99)
GLUCOSE-CAPILLARY: 345 mg/dL — AB (ref 65–99)
Glucose-Capillary: 275 mg/dL — ABNORMAL HIGH (ref 65–99)
Glucose-Capillary: 292 mg/dL — ABNORMAL HIGH (ref 65–99)
Glucose-Capillary: 327 mg/dL — ABNORMAL HIGH (ref 65–99)
Glucose-Capillary: 333 mg/dL — ABNORMAL HIGH (ref 65–99)

## 2016-05-05 LAB — DIFFERENTIAL
BASOS PCT: 0 %
Basophils Absolute: 0 10*3/uL (ref 0.0–0.1)
EOS ABS: 0.1 10*3/uL (ref 0.0–0.7)
EOS PCT: 1 %
Lymphocytes Relative: 16 %
Lymphs Abs: 1.7 10*3/uL (ref 0.7–4.0)
MONO ABS: 1.1 10*3/uL — AB (ref 0.1–1.0)
Monocytes Relative: 10 %
NEUTROS PCT: 73 %
Neutro Abs: 8.1 10*3/uL — ABNORMAL HIGH (ref 1.7–7.7)

## 2016-05-05 LAB — I-STAT CG4 LACTIC ACID, ED: LACTIC ACID, VENOUS: 1.71 mmol/L (ref 0.5–1.9)

## 2016-05-05 LAB — I-STAT VENOUS BLOOD GAS, ED
Acid-Base Excess: 11 mmol/L — ABNORMAL HIGH (ref 0.0–2.0)
BICARBONATE: 33.7 mmol/L — AB (ref 20.0–28.0)
O2 SAT: 85 %
PCO2 VEN: 35.4 mmHg — AB (ref 44.0–60.0)
PO2 VEN: 42 mmHg (ref 32.0–45.0)
TCO2: 35 mmol/L (ref 0–100)
pH, Ven: 7.587 — ABNORMAL HIGH (ref 7.250–7.430)

## 2016-05-05 LAB — CBC
HCT: 33.9 % — ABNORMAL LOW (ref 36.0–46.0)
Hemoglobin: 11.4 g/dL — ABNORMAL LOW (ref 12.0–15.0)
MCH: 29 pg (ref 26.0–34.0)
MCHC: 33.6 g/dL (ref 30.0–36.0)
MCV: 86.3 fL (ref 78.0–100.0)
PLATELETS: 347 10*3/uL (ref 150–400)
RBC: 3.93 MIL/uL (ref 3.87–5.11)
RDW: 17.1 % — AB (ref 11.5–15.5)
WBC: 11 10*3/uL — ABNORMAL HIGH (ref 4.0–10.5)

## 2016-05-05 LAB — APTT: aPTT: 32 seconds (ref 24–36)

## 2016-05-05 LAB — RAPID URINE DRUG SCREEN, HOSP PERFORMED
Amphetamines: NOT DETECTED
BARBITURATES: NOT DETECTED
BENZODIAZEPINES: NOT DETECTED
COCAINE: NOT DETECTED
Opiates: POSITIVE — AB
Tetrahydrocannabinol: NOT DETECTED

## 2016-05-05 LAB — PROTIME-INR
INR: 0.97
PROTHROMBIN TIME: 12.8 s (ref 11.4–15.2)

## 2016-05-05 LAB — SALICYLATE LEVEL

## 2016-05-05 LAB — OSMOLALITY, URINE: Osmolality, Ur: 331 mOsm/kg (ref 300–900)

## 2016-05-05 LAB — AMMONIA: Ammonia: 51 umol/L — ABNORMAL HIGH (ref 9–35)

## 2016-05-05 LAB — OSMOLALITY: Osmolality: 306 mOsm/kg — ABNORMAL HIGH (ref 275–295)

## 2016-05-05 LAB — LIPASE, BLOOD: Lipase: 27 U/L (ref 11–51)

## 2016-05-05 LAB — TROPONIN I: Troponin I: <0.03 ng/mL (ref <0.03)

## 2016-05-05 LAB — ACETAMINOPHEN LEVEL: Acetaminophen (Tylenol), Serum: 10 ug/mL (ref 10–30)

## 2016-05-05 LAB — ETHANOL

## 2016-05-05 MED ORDER — INSULIN ASPART 100 UNIT/ML ~~LOC~~ SOLN
0.0000 [IU] | SUBCUTANEOUS | Status: DC
  Administered 2016-05-05 (×2): 7 [IU] via SUBCUTANEOUS
  Administered 2016-05-05: 5 [IU] via SUBCUTANEOUS
  Administered 2016-05-06 (×2): 3 [IU] via SUBCUTANEOUS
  Administered 2016-05-06: 7 [IU] via SUBCUTANEOUS
  Administered 2016-05-06: 3 [IU] via SUBCUTANEOUS
  Administered 2016-05-06: 5 [IU] via SUBCUTANEOUS
  Administered 2016-05-07: 2 [IU] via SUBCUTANEOUS
  Administered 2016-05-07: 7 [IU] via SUBCUTANEOUS
  Administered 2016-05-07: 2 [IU] via SUBCUTANEOUS
  Administered 2016-05-07 (×2): 3 [IU] via SUBCUTANEOUS
  Administered 2016-05-07 – 2016-05-08 (×2): 5 [IU] via SUBCUTANEOUS
  Administered 2016-05-08 (×2): 3 [IU] via SUBCUTANEOUS
  Administered 2016-05-08 – 2016-05-09 (×4): 2 [IU] via SUBCUTANEOUS
  Administered 2016-05-09: 3 [IU] via SUBCUTANEOUS
  Administered 2016-05-09 (×2): 2 [IU] via SUBCUTANEOUS
  Administered 2016-05-09: 3 [IU] via SUBCUTANEOUS
  Administered 2016-05-10: 2 [IU] via SUBCUTANEOUS
  Administered 2016-05-10: 1 [IU] via SUBCUTANEOUS
  Administered 2016-05-10: 2 [IU] via SUBCUTANEOUS
  Administered 2016-05-10 (×2): 3 [IU] via SUBCUTANEOUS
  Administered 2016-05-10 – 2016-05-11 (×2): 2 [IU] via SUBCUTANEOUS
  Administered 2016-05-11: 1 [IU] via SUBCUTANEOUS
  Administered 2016-05-11: 2 [IU] via SUBCUTANEOUS
  Administered 2016-05-11: 1 [IU] via SUBCUTANEOUS
  Administered 2016-05-11: 3 [IU] via SUBCUTANEOUS
  Administered 2016-05-12 (×2): 2 [IU] via SUBCUTANEOUS
  Administered 2016-05-12 (×3): 1 [IU] via SUBCUTANEOUS
  Administered 2016-05-13: 2 [IU] via SUBCUTANEOUS
  Administered 2016-05-13: 1 [IU] via SUBCUTANEOUS
  Administered 2016-05-13: 2 [IU] via SUBCUTANEOUS
  Administered 2016-05-13: 1 [IU] via SUBCUTANEOUS

## 2016-05-05 MED ORDER — INSULIN GLARGINE 100 UNIT/ML ~~LOC~~ SOLN: 8.0000 [IU] | Freq: Every day | SUBCUTANEOUS | Status: DC

## 2016-05-05 MED ORDER — SODIUM CHLORIDE 0.9 % IV BOLUS (SEPSIS)
1000.0000 mL | Freq: Once | INTRAVENOUS | Status: AC
  Administered 2016-05-05: 1000 mL via INTRAVENOUS

## 2016-05-05 MED ORDER — ONDANSETRON HCL 4 MG/2ML IJ SOLN
4.0000 mg | Freq: Four times a day (QID) | INTRAMUSCULAR | Status: DC | PRN
  Administered 2016-05-06: 4 mg via INTRAVENOUS
  Filled 2016-05-05: qty 2

## 2016-05-05 MED ORDER — SODIUM CHLORIDE 0.9 % IV SOLN
INTRAVENOUS | Status: DC
  Administered 2016-05-05 – 2016-05-07 (×3): via INTRAVENOUS

## 2016-05-05 MED ORDER — GABAPENTIN 300 MG PO CAPS
600.0000 mg | ORAL_CAPSULE | Freq: Four times a day (QID) | ORAL | Status: DC
  Administered 2016-05-05 – 2016-05-06 (×6): 600 mg via ORAL
  Filled 2016-05-05 (×6): qty 2

## 2016-05-05 MED ORDER — INSULIN ASPART 100 UNIT/ML ~~LOC~~ SOLN
0.0000 [IU] | Freq: Three times a day (TID) | SUBCUTANEOUS | Status: DC
  Administered 2016-05-05: 7 [IU] via SUBCUTANEOUS

## 2016-05-05 MED ORDER — ACETAMINOPHEN 325 MG PO TABS
650.0000 mg | ORAL_TABLET | Freq: Once | ORAL | Status: AC
  Administered 2016-05-05: 650 mg via ORAL
  Filled 2016-05-05: qty 2

## 2016-05-05 MED ORDER — PIPERACILLIN-TAZOBACTAM 3.375 G IVPB
3.3750 g | Freq: Three times a day (TID) | INTRAVENOUS | Status: DC
  Administered 2016-05-05 – 2016-05-13 (×25): 3.375 g via INTRAVENOUS
  Filled 2016-05-05 (×27): qty 50

## 2016-05-05 MED ORDER — INSULIN GLARGINE 100 UNIT/ML ~~LOC~~ SOLN
8.0000 [IU] | Freq: Two times a day (BID) | SUBCUTANEOUS | Status: DC
  Administered 2016-05-05 – 2016-05-13 (×15): 8 [IU] via SUBCUTANEOUS
  Filled 2016-05-05 (×17): qty 0.08

## 2016-05-05 MED ORDER — PIPERACILLIN-TAZOBACTAM 3.375 G IVPB 30 MIN
3.3750 g | Freq: Once | INTRAVENOUS | Status: AC
  Administered 2016-05-05: 3.375 g via INTRAVENOUS
  Filled 2016-05-05: qty 50

## 2016-05-05 MED ORDER — ROPINIROLE HCL 1 MG PO TABS
2.0000 mg | ORAL_TABLET | Freq: Two times a day (BID) | ORAL | Status: DC
  Administered 2016-05-05 – 2016-05-13 (×14): 2 mg via ORAL
  Filled 2016-05-05 (×14): qty 2

## 2016-05-05 MED ORDER — ROPINIROLE HCL ER 4 MG PO TB24: 4.0000 mg | ORAL_TABLET | Freq: Every day | ORAL | Status: DC

## 2016-05-05 MED ORDER — IOPAMIDOL (ISOVUE-300) INJECTION 61%
INTRAVENOUS | Status: AC
  Administered 2016-05-05: 100 mL
  Filled 2016-05-05: qty 100

## 2016-05-05 MED ORDER — ASPIRIN EC 81 MG PO TBEC
81.0000 mg | DELAYED_RELEASE_TABLET | Freq: Every day | ORAL | Status: DC
  Administered 2016-05-05 – 2016-05-13 (×8): 81 mg via ORAL
  Filled 2016-05-05 (×8): qty 1

## 2016-05-05 MED ORDER — ENOXAPARIN SODIUM 40 MG/0.4ML ~~LOC~~ SOLN
40.0000 mg | SUBCUTANEOUS | Status: DC
Start: 2016-05-05 — End: 2016-05-06
  Administered 2016-05-05: 40 mg via SUBCUTANEOUS
  Filled 2016-05-05: qty 0.4

## 2016-05-05 MED ORDER — HYDROCODONE-ACETAMINOPHEN 10-325 MG PO TABS
1.0000 | ORAL_TABLET | Freq: Four times a day (QID) | ORAL | Status: DC | PRN
  Administered 2016-05-05 – 2016-05-08 (×6): 1 via ORAL
  Filled 2016-05-05 (×6): qty 1

## 2016-05-05 MED ORDER — EZETIMIBE 10 MG PO TABS
10.0000 mg | ORAL_TABLET | Freq: Every day | ORAL | Status: DC
  Administered 2016-05-05 – 2016-05-13 (×8): 10 mg via ORAL
  Filled 2016-05-05 (×8): qty 1

## 2016-05-05 MED ORDER — DEXTROSE 5 % IV SOLN
1.0000 g | Freq: Once | INTRAVENOUS | Status: AC
  Administered 2016-05-05: 1 g via INTRAVENOUS
  Filled 2016-05-05: qty 10

## 2016-05-05 MED ORDER — CARVEDILOL 25 MG PO TABS
25.0000 mg | ORAL_TABLET | Freq: Two times a day (BID) | ORAL | Status: DC
  Administered 2016-05-05 – 2016-05-13 (×14): 25 mg via ORAL
  Filled 2016-05-05 (×15): qty 1

## 2016-05-05 MED ORDER — ONDANSETRON HCL 4 MG PO TABS
4.0000 mg | ORAL_TABLET | Freq: Four times a day (QID) | ORAL | Status: DC | PRN
  Administered 2016-05-07: 4 mg via ORAL
  Filled 2016-05-05: qty 1

## 2016-05-05 MED ORDER — INSULIN ASPART 100 UNIT/ML ~~LOC~~ SOLN: 0.0000 [IU] | Freq: Every day | SUBCUTANEOUS | Status: DC

## 2016-05-05 MED ORDER — CITALOPRAM HYDROBROMIDE 20 MG PO TABS
20.0000 mg | ORAL_TABLET | Freq: Every day | ORAL | Status: DC
  Administered 2016-05-05 – 2016-05-13 (×8): 20 mg via ORAL
  Filled 2016-05-05 (×8): qty 1

## 2016-05-05 MED ORDER — PREGABALIN 100 MG PO CAPS
200.0000 mg | ORAL_CAPSULE | Freq: Two times a day (BID) | ORAL | Status: DC
  Administered 2016-05-05 – 2016-05-13 (×15): 200 mg via ORAL
  Filled 2016-05-05 (×15): qty 2

## 2016-05-05 NOTE — H&P (Signed)
History and Physical  Patient Name: Melissa Myers     GLO:[id number]    DOB: June 10, 1945    DOA: 05/04/2016 PCP: [name], MD   Patient coming from: Home  Chief Complaint: Lucky Rathke, abdominal pain, nausea, headache  HPI: Melissa Myers is a 71 y.o. female with a past medical history significant for IDDM, HFpEF, HTN, and recent pancreatic mass followed by [facility] underwent biliary stent 04/28/16 who presents with fever, abdominal pain, malaise, confusion.  Caveat that the patient is not able to provide complete history, so much history is collected from EDP and family.  She was admitted in January with a new pancreatic mass and obstructive jaundice.  Had ERCP and biliary stent placed at that time, Tbili peaked >17 and has since improved all the way to 1.3 last month.  One week ago, she had EUS and aspiration and since then has been home.  She tells me she has been eating and ambulating but still feeling malaise most days, tired, and with intermittent moderate to severe abdominal pain persisting since the EUS, in both sides of her abdomen.  She cannot tell me what in particular prompted her to come to the ER tonight, but per husband's description to EDP and nurse she was complaining of more pain and headache not relieved with Vicodin, and then got confused, so they called EMS.  ED course: -Temp 101.61F, heart rate 67, respirations and pulse ox normal, BP 132/53 -Na 131, K 4.3, Cr 1.38 (baseline 1.2), WBC 11K with left shift, Hgb 11.4 and improving from previous -Alcohol negative, ammonia stable -Salicylates and acetaminophen negative -UDS showed only opiates -Coags normal -Troponin negative -Lipase normal -Urinalysis showed nitrites and 6-30 WBCs -Lactate 1.71 -CT head unremarkable -CT abdomen and pelvis showed new biliary stent, no acute findings -LFTs showed Tbili rising from previous to 3.7 and AlkPhos 752 and AST/ALT 127 and 69, all of which were near normal on  3/8 -She was given ceftriaxone for UTI, then this was broadened to Zosyn to cover for possible cholangitis, and TRH were asked to evaluate for admission     ROS: Review of Systems  Unable to perform ROS: Other  Patient just unable to reliably say other than abdominal pain.      Past Medical History:  Diagnosis Date  . Anemia   . Cellulitis    LOWER EXTREMITIES  . Chronic diarrhea    a/w nausea - felt related to IBS  . Deaf    left side only  . Diastolic CHF ([place])   . Disc degeneration, lumbar   . GERD (gastroesophageal reflux disease)   . Hyperlipidemia    hx rhabdo on statins  . Hypertension   . Neuropathy ([facility])    feet, toes and fingers  . On home oxygen therapy    uses oxygen 2 liters min per [place] at night and prn during day  . OSA (obstructive sleep apnea)    05/2009 sleep study - refuses CPAP  . Osteoarthritis   . RLS (restless legs syndrome)   . Shortness of breath    chronic  . Stasis dermatitis   . Type II or unspecified type diabetes mellitus without mention of complication, not stated as uncontrolled    insulin dep    Past Surgical History:  Procedure Laterality Date  . CHOLECYSTECTOMY  1997  . COLONOSCOPY N/A 12/03/2012   Procedure: COLONOSCOPY;  Surgeon: Lafayette Dragon, MD;  Location: [facility];  Service: Endoscopy;  Laterality: N/A;  .  ERCP N/A 03/18/2016   Procedure: ENDOSCOPIC RETROGRADE CHOLANGIOPANCREATOGRAPHY (ERCP);  Surgeon: Irene Shipper, MD;  Location: [facility];  Service: Endoscopy;  Laterality: N/A;  . EUS N/A 04/28/2016   Procedure: UPPER ENDOSCOPIC ULTRASOUND (EUS) LINEAR;  Surgeon: [name], MD;  Location: [facility];  Service: Endoscopy;  Laterality: N/A;  . TONSILLECTOMY  1970  . TUBAL LIGATION  1980  . UMBILICAL HERNIA REPAIR  1995  . uterine polyp removal  2008    Social History: Patient lives with her husband.  The patient walks unassisted at baseline.  Per chart, nonsmoker.    Allergies  Allergen Reactions  .  Cymbalta [Duloxetine Hcl] Other (See Comments)    Restless leg syndrome  . Statins Other (See Comments)    Statin drugs cause muscle pain / "muscle damage"--was told by MD not to take  . Sulfa Antibiotics Diarrhea  . Levemir [Insulin Detemir] Itching  . Morphine Other (See Comments)    GI upset and headaches  . Zinc Swelling and Rash    Family history: family history includes Heart attack (age of onset: 49) in her mother; Heart attack (age of onset: 72) in her father; Heart disease in her father and mother; Lung cancer in her paternal grandfather; Stomach cancer in her paternal grandmother.  Prior to Admission medications   Medication Sig Start Date End Date Taking? Authorizing Provider  aspirin EC 81 MG tablet Take 1 tablet (81 mg total) by mouth daily. 07/08/14   Rowe Clack, MD  carvedilol (COREG) 25 MG tablet TAKE 1 TABLET BY MOUTH TWICE DAILY WITH FOOD 01/20/16   Jolaine Artist, MD  Cholecalciferol (VITAMIN D3) 2000 units capsule Take 1 capsule (2,000 Units total) by mouth daily. 06/17/15   Janith Lima, MD  citalopram (CELEXA) 20 MG tablet TAKE 1 TABLET (20 MG TOTAL) BY MOUTH DAILY. 01/27/16   [name], MD  DULoxetine (CYMBALTA) 60 MG capsule Take 1 capsule (60 mg total) by mouth daily. 09/25/15   [name], MD  ezetimibe (ZETIA) 10 MG tablet TAKE ONE TABLET BY MOUTH DAILY 02/03/16   [name], MD  gabapentin (NEURONTIN) 300 MG capsule TAKE TWO CAPSULES BY MOUTH FOUR TIMES A DAY 01/19/16   [name], MD  glucose blood (ONETOUCH VERIO) test strip Use as instructed up to 4 times per day 10/23/15   [name], MD  hydrALAZINE (APRESOLINE) 25 MG tablet TAKE 1 TABLET (25 MG TOTAL) BY MOUTH 3 (THREE) TIMES DAILY. 02/16/16   Jolaine Artist, MD  HYDROcodone-acetaminophen (NORCO) 10-325 MG tablet Take 1 tablet by mouth every 6 (six) hours as needed. 04/08/16   [name], MD  insulin aspart (NOVOLOG) 100 UNIT/ML injection  Inject 0-20 Units into the skin 3 (three) times daily with meals. . CBG 70 - 120: 0 units CBG 121 - 150: 3 units CBG 151 - 200: 4 units CBG 201 - 250: 7 units CBG 251 - 300: 11 units CBG 301 - 350: 15 units CBG 351 - 400: 20 units 03/21/16   Mariel Aloe, MD  insulin glargine (LANTUS) 100 UNIT/ML injection Inject 0.3 mLs (30 Units total) into the skin daily. 03/22/16   Mariel Aloe, MD  LYRICA 200 MG capsule TAKE ONE CAPSULE BY MOUTH TWICE A DAY 04/07/16   [name], MD  meloxicam (MOBIC) 7.5 MG tablet TAKE ONE TABLET BY MOUTH DAILY 03/11/16   [name], MD  metFORMIN (GLUCOPHAGE-XR) 500 MG 24  hr tablet Take 1,000 mg by mouth daily with breakfast.     Historical Provider, MD  metolazone (ZAROXOLYN) 2.5 MG tablet Take 1 tablet (2.5 mg total) by mouth 2 (two) times a week. 12/17/15   Cyrena Friar, PA-C  rOPINIRole (REQUIP XL) 4 MG 24 hr tablet TAKE 1 TABLET (4 MG TOTAL) BY MOUTH AT BEDTIME. 02/23/16   [name], MD  spironolactone (ALDACTONE) 25 MG tablet TAKE ONE TABLET EACH DAY 04/21/16   Larey Dresser, MD  torsemide (DEMADEX) 100 MG tablet Take 1 tablet (100 mg total) by mouth daily. 03/21/16   Mariel Aloe, MD  vitamin B-12 (CYANOCOBALAMIN) 500 MCG tablet Take 500 mcg by mouth daily. Reported on 09/09/2015    Historical Provider, MD       Physical Exam: BP (!) 109/50   Pulse 62   Temp 98.9 F (37.2 C) (Oral)   Resp 14   Ht 4\' 10"  (1.473 m)   Wt 108.9 kg (240 lb)   SpO2 97%   BMI 50.16 kg/m  General appearance: Well-developed, obese elderly adult female, awake but drifts off, cannot maintain attention.  Oriented to [facility].  Not able to consistently describe what was happening today. Eyes: Icteric, conjunctiva pink, lids and lashes normal. PERRL.    ENT: No nasal deformity, discharge, epistaxis.  Hearing normal. MM dry.   Neck: No neck masses.  Trachea midline.  No thyromegaly/tenderness. Lymph: No cervical or supraclavicular  lymphadenopathy. Skin: Warm and dry.  Mild jaundice.  No suspicious rashes or lesions.  There is chronic venous stasis changes and redness of both legs, no tenderness or swelling that suggest infection in either lower leg. Cardiac: RRR, nl S1-S2, no murmurs appreciated.  Capillary refill is brisk.  JVP not visible.  Chronic nonpitting LE edema.  Radial  pulses 2+ and symmetric.DP pulses diminished. Respiratory: Normal respiratory rate and rhythm.  CTAB without rales or wheezes. Abdomen: Abdomen soft.  Moderate Right sided TTP, no rebound or guarding or left sided TTP. No ascites, distension, hepatosplenomegaly.   MSK: No deformities or effusions.  No cyanosis or clubbing. Neuro: Cranial nerves normal.  Sensation intact to light touch. Speech is fluent.  Muscle strength weak but symmetric.    Psych: Awake and responding to questions, but attention abnormalAffect blunted.  Judgment and insight appear impaired.     Labs on Admission:  I have personally reviewed following labs and imaging studies: CBC:  Recent Labs Lab 05/05/16 0023  WBC 11.0*  NEUTROABS 8.1*  HGB 11.4*  HCT 33.9*  MCV 86.3  PLT 924   Basic Metabolic Panel:  Recent Labs Lab 04/28/16 1015 05/05/16 0023  NA 131* 131*  K 3.8 4.3  CL 90* 86*  CO2 29 30  GLUCOSE 449* 416*  BUN 34* 24*  CREATININE 1.21* 1.38*  CALCIUM 9.3 8.5*   GFR: Estimated Creatinine Clearance: 40.8 mL/min (A) (by C-G formula based on SCr of 1.38 mg/dL (H)).  Liver Function Tests:  Recent Labs Lab 04/28/16 1015 05/05/16 0023  AST 27 127*  ALT 23 69*  ALKPHOS 148* 752*  BILITOT 1.3* 3.7*  PROT 7.0 6.8  ALBUMIN 3.3* 2.4*    Recent Labs Lab 05/05/16 0023  LIPASE 27    Recent Labs Lab 05/05/16 0023  AMMONIA 51*   Coagulation Profile:  Recent Labs Lab 05/05/16 0023  INR 0.97   Cardiac Enzymes:  Recent Labs Lab 05/05/16 0023  TROPONINI <0.03   BNP (last 3 results)  Recent Labs  11/04/15 0956  PROBNP 20.0    HbA1C: No results for input(s): HGBA1C in the last 72 hours. CBG:  Recent Labs Lab 04/28/16 1019 04/28/16 1022 04/28/16 1235 04/28/16 1444 05/04/16 2340  GLUCAP 435* 423* 312* 180* 403*   Lipid Profile: No results for input(s): CHOL, HDL, LDLCALC, TRIG, CHOLHDL, LDLDIRECT in the last 72 hours. Thyroid Function Tests: No results for input(s): TSH, T4TOTAL, FREET4, T3FREE, THYROIDAB in the last 72 hours. Anemia Panel: No results for input(s): VITAMINB12, FOLATE, FERRITIN, TIBC, IRON, RETICCTPCT in the last 72 hours. Sepsis Labs: Lactate normal Invalid input(s): PROCALCITONIN, LACTICIDVEN Recent Results (from the past 240 hour(s))  Culture, blood (Routine X 2) w Reflex to ID Panel     Status: None (Preliminary result)   Collection Time: 05/05/16  3:34 AM  Result Value Ref Range Status   Specimen Description BLOOD RIGHT ARM  Final   Special Requests AEROBIC BOTTLE ONLY 10ML ROCEPHIN  Final   Culture PENDING  Incomplete   Report Status PENDING  Incomplete  Culture, blood (Routine X 2) w Reflex to ID Panel     Status: None (Preliminary result)   Collection Time: 05/05/16  3:42 AM  Result Value Ref Range Status   Specimen Description BLOOD RIGHT HAND  Final   Special Requests IN PEDIATRIC BOTTLE 4ML ROCEPHIN  Final   Culture PENDING  Incomplete   Report Status PENDING  Incomplete         Radiological Exams on Admission: Personally reviewed CT head and adbomen/pelvis reports, CXR personally reviewed and shows no focal airspace disease: Ct Head Wo Contrast  Result Date: 05/05/2016 CLINICAL DATA:  71 year old female with headache and confusion. EXAM: CT HEAD WITHOUT CONTRAST TECHNIQUE: Contiguous axial images were obtained from the base of the skull through the vertex without intravenous contrast. COMPARISON:  Head CT dated 10/22/2014 FINDINGS: Brain: The ventricles and sulci appropriate in size for patient's age. Minimal periventricular and deep white matter chronic  microvascular ischemic changes noted. There is no acute intracranial hemorrhage. No mass effect or midline shift noted. There is no extra-axial fluid collection. Vascular: No hyperdense vessel or unexpected calcification. Skull: Normal. Negative for fracture or focal lesion. Sinuses/Orbits: No acute finding. Other: None. IMPRESSION: No acute intracranial pathology. Electronically Signed   By: Anner Crete M.D.   On: 05/05/2016 01:21   Ct Abdomen Pelvis W Contrast  Result Date: 05/05/2016 CLINICAL DATA:  Epigastric pain. Hx diabetes, GERD, lumbar disc degeneration, chronic diarrhea, uterine polyp removal, umbilical hernia, tubal ligation, and cholecystectomy. EXAM: CT ABDOMEN AND PELVIS WITH CONTRAST TECHNIQUE: Multidetector CT imaging of the abdomen and pelvis was performed using the standard protocol following bolus administration of intravenous contrast. CONTRAST:  18mL ISOVUE-300 IOPAMIDOL (ISOVUE-300) INJECTION 61% COMPARISON:  ERCP 03/18/2016.  MRI 03/16/2016.  CT 03/15/2016. FINDINGS: Lower chest: Scattered emphysematous changes in the lung bases. Fibrosis or atelectasis in the lung bases. Calcification in the mitral valve annulus and coronary arteries. Hepatobiliary: Since the previous study, there is interval placement of a bile duct stent. Mild residual bile duct dilatation is less prominent than on prior study. Surgical absence of the gallbladder. No focal liver lesions. Pancreas: Diffuse pancreatic ductal dilatation with pancreatic parenchymal atrophy. Mass lesion demonstrated in the head of the pancreas on previous MRI is not well visualized at CT. No inflammatory stranding. Spleen: Focal lesion in the spleen measuring about 6 mm diameter is unchanged since previous study. This probably represents a small cyst. Additional subcentimeter lesion in the upper spleen is also unchanged.  Adrenals/Urinary Tract: Adrenal glands are unremarkable. Kidneys are normal, without renal calculi, focal lesion, or  hydronephrosis. Bladder is unremarkable. Stomach/Bowel: Stomach and small bowel are mostly decompressed. No small bowel distention or inflammatory infiltration. Colon is diffusely stool-filled without abnormal distention or inflammation. Scattered colonic diverticula. Appendix is not identified. Vascular/Lymphatic: Aortic atherosclerosis. No enlarged abdominal or pelvic lymph nodes. Reproductive: Uterus and bilateral adnexa are unremarkable. Other: Small left inguinal hernia containing fat. No free air or free fluid in the abdomen. Musculoskeletal: Degenerative changes in the spine. No destructive bone lesions. IMPRESSION: Interval placement of a bile duct stent with some decompression of the bile ducts. Mild residual biliary dilatation. Pancreatic ductal dilatation and atrophy. Known pancreatic mass lesion is not well depicted at CT. No evidence of bowel obstruction or inflammation. Electronically Signed   By: Lucienne Capers M.D.   On: 05/05/2016 04:37   Dg Chest Port 1 View  Result Date: 05/05/2016 CLINICAL DATA:  Confusion, hyperglycemia, and headache tonight EXAM: PORTABLE CHEST 1 VIEW COMPARISON:  03/28/2016 FINDINGS: Shallow inspiration. The heart size and mediastinal contours are within normal limits. Both lungs are clear. The visualized skeletal structures are unremarkable. IMPRESSION: No active disease. Electronically Signed   By: Lucienne Capers M.D.   On: 05/05/2016 00:04    EKG: Independently reviewed. Rate 68, QTc 477, RBBB, no change from previous.         Assessment/Plan  1. Cholangitis:  Alternative considerations include UTI.  Benign considerations include viral syndrome.  Meningitis/encephalitis is doubted. -IV Zosyn -Clear liquids only -Continue IV fluids -Trend LFTs -Follow blood and urine cultures   2. Hyponatremia:  Mild.  Hypovolemic. -Check osmolalities  3. Anemia, normocytic:  Improving from previous.  4. Insulin dependent diabetes:  -Decrease home  glargine until taking full diet -SSI with meals -Hold orals  5. Hypertension and chronic diastolic CHF:  BP soft at admission. -Hold hydralazine, diuretics until hemodynamics clearer -Continue beta-blocker for now  6. Other medciations:  -Continue Norco -Continue ropinirole -Continue Lyrica, citalopram, gabapentin      DVT prophylaxis: Lovenox  Code Status: FULL  Family Communication: None present  Disposition Plan: Anticipate IV fluids and empiric antibiotics and follow blood and urine cultures. Consults called: None Admission status: INPATIENT       Medical decision making: Patient seen at 5:30 AM on 05/05/2016.  The patient was discussed with Dr. Christy Gentles.  What exists of the patient's chart was reviewed in depth and summarized above.  Clinical condition: still confused, but hemodynamically stable.        [name] [facility] Pager [phone]     At the time of admission, it appears that the appropriate admission status for this patient is INPATIENT. This is judged to be reasonable and necessary in order to provide the required intensity of service to ensure the patient's safety given the presenting symptoms, physical exam findings, and initial radiographic and laboratory data in the context of their chronic comorbidities.  Together, these circumstances are felt to place her at high risk for further clinical deterioration threatening life, limb, or organ.   Patient requires inpatient status due to high intensity of service, high risk for further deterioration and high frequency of surveillance required because of this acute illness that poses a threat to life, limb or bodily function.  I certify that at the point of admission it is my clinical judgment that the patient will require inpatient hospital care spanning beyond 2 midnights from the point of admission and that early  discharge would result in unnecessary risk of decompensation and readmission  or threat to life, limb or bodily function.

## 2016-05-05 NOTE — Progress Notes (Signed)
Pt seen and examined at bedside, son present. Pt admitted after midnight, please see earlier admission note by Dr. Loleta Books. Pt admitted for evaluation of malaise and abd pain. Recently found pancreatic mass, has been followed by  GI and is supposed to see Dr. Barry Dienes next week. Will consult with GI team, appreciate assistance. Will obtain CMET and CBC in AM.  Faye Ramsay, MD  [facility] Pager [phone]  If 7PM-7AM, please contact night-coverage [email or web address] Password [email or web address]

## 2016-05-05 NOTE — ED Notes (Signed)
Patient transported to CT 

## 2016-05-05 NOTE — Care Management Note (Signed)
Case Management Note  Patient Details  Name: Melissa Myers MRN: [id number] Date of Birth: [birth year]  Subjective/Objective:    Admitted with Cholangitis, recurrent jaundice, hx of IDDM, HFpEF, HTN, recent pancreatic mass followed by [facility] underwent biliary stent, home oxygen prn [facility]). Resides with husband Melissa Myers. Pt's husband states PTA pt was independent with ADL's and used a walker for ambulation. Husband states wife receives help from a nurse monthly to fill medications pill box. However, he couldn't remember name of program.  Melissa Myers. (Spouse) Melissa Myers [facility])     [phone]     PCP: Pricilla Holm  Action/Plan: GI following, .....Marland KitchenERCP, stent exchange tomorrow 3/16. CM to continue following for disposition needs.  Expected Discharge Date:                  Expected Discharge Plan:  [place]  In-House Referral:     Discharge planning Services  CM Consult  Post Acute Care Choice:    Choice offered to:   patient  DME Arranged:    DME Agency:     HH Arranged:    [place] Agency:     Status of Service:  In process, will continue to follow  If discussed at Long Length of Stay Meetings, dates discussed:    Additional Comments:  Sharin Mons, RN 05/05/2016, 1:27 PM

## 2016-05-05 NOTE — ED Notes (Signed)
Pt husband reports that he gave 3 baby ASA prior to arrival. Symptoms of headache started at 2100 and confusion started at approx 2200.

## 2016-05-05 NOTE — Consult Note (Signed)
[facility] Consult: 9:35 AM 05/05/2016  LOS: 0 days    Referring Provider: Dr Doyle Askew  Primary Care Physician:  [name], MD Primary Gastroenterologist:  Dr. Scarlette Shorts    Reason for Consultation:  Cholangitis, recurrent jaundice.    HPI: Melissa Myers is a 71 y.o. female.  PMH poorly controlled DM2 despite insulin.  Diabetic neuropathy. Chronic leg pain. OSA, declines CPAP but home oxygen prn.  Stasis dermatitis .  IBS-D.  s/p cholecystectomy 1997 and  Gastritis on EGD 1994.  Hemorrhoids and normal random biopsies on o/w normal colonoscopies 1994, 2003, 2013.  Tubular adenoma on 11/2012 Colonoscopy.  2009 ERCP with 12 mm sphincterotomy due to jaundice, with ? filling defect but no stones on bile duct sweep.   Diagnosed with obstructive jaundice at admission 1/23 - 03/21/2016.  No obvious mass on CT which showed borderline biliary duct dilatation and moderate pancreatic duct dilatation.  MRCP with mass at uncinate, double duct sign, no liver mets or adenopathy.   LFTs, pruritus, jaundice improved post 03/18/16 ERCP with biliary stent placement across malignant appearing distal CBD stricture.  CA 19-9 107.  Signed out of rehab after 1 day.    EUS 04/28/16: stent in good position.  2.9 cm mass in pancreatic uncinate obstructing main PD and bile duct. Mass does not directly abut the SMA, SMV, PV or celiac trunk and no clear peripancreatic adenopathy.  Adenocarcinoma on FNA cytology.  Planned referral to Dr Barry Dienes (appt 3/19, 2:30 arrival) and medical oncology (do not see pending appt).    Presented to ED 03/27/16 with classic flu sxs. Tested + for influenza A and sent home on Tamiflu.  Returned to ED 2/6 with persistent flu sxs.  Dosed with Rocephin and Azithromycin x 1 and discharged on Levaquin but did not  fill the Rx.  Blood sugars into 600s ~ 04/01/16. Restarted on Lyrica, Gabapentin for leg pain at PMD office visit 04/08/16.    Issues with LE cellulitis starting > 1week ago.  Ongoing abdominal bloating and pain since EUS.    ++++++++++++++++++++++++++++++++++++++++++++++++++++++++++++++++++++++++++++++++  Admitted from ED where she presented with abd pain, n/v, headache.  Confused in ED, but had been given Vicodin, as well as 243 ASA at home.  Says upper abd pain never really resolved but went from milder to 8/10 in recent days.  Anorexia is chronic, suspects 9# weight loss, no nausea.   + chills and sweats.  Sleeping a lot and feels rundown.  Stable, non-bloody loose stools 3 x day per her norm.    LFTs compared with 1 week ago as follows: T bili 1.3 >> 3.7.  Alk phos 148 >> 753.  AST/ALT 27/23 >> 127/69.  Albumin 3.3 >> 2.4.  Ammonia is 51 (50 seven weeks ago).  Lipase normal.  Coags normal.   WBCs 11.5, previously normal on 03/28/16.   Hgb up from 8.4 to 11.4 over 6 weeks.   Glucoses in low to mid 400s. Na 131.  AKI: 24/1.3.  CT abdomen and pelvis 05/05/16:  Mild residual biliary dilatation post  stenting. Pancreatic ductal dilatation and atrophy. Known pancreatic mass lesion is not well depicted at CT. No bowel obstruction or inflammation. CT Head 05/05/16:  No pathology.  CXR unremarkable.    Received Rocephin x 1 and Zosyn initiated.  Abdominal pain improved with Hydrocodone.  Mental status somewhat improved but remains disoriented.     Past Medical History:  Diagnosis Date  . Anemia   . Cellulitis    LOWER EXTREMITIES  . Chronic diarrhea    a/w nausea - felt related to IBS  . Deaf    left side only  . Diastolic CHF ([place])   . Disc degeneration, lumbar   . GERD (gastroesophageal reflux disease)   . Hyperlipidemia    hx rhabdo on statins  . Hypertension   . Neuropathy (HCC)    feet, toes and fingers  . On home oxygen therapy    uses oxygen 2 liters min per [place] at night and prn during  day  . OSA (obstructive sleep apnea)    05/2009 sleep study - refuses CPAP  . Osteoarthritis   . RLS (restless legs syndrome)   . Shortness of breath    chronic  . Stasis dermatitis   . Type II or unspecified type diabetes mellitus without mention of complication, not stated as uncontrolled    insulin dep    Past Surgical History:  Procedure Laterality Date  . CHOLECYSTECTOMY  1997  . COLONOSCOPY N/A 12/03/2012   Procedure: COLONOSCOPY;  Surgeon: Lafayette Dragon, MD;  Location: [facility];  Service: Endoscopy;  Laterality: N/A;  . ERCP N/A 03/18/2016   Procedure: ENDOSCOPIC RETROGRADE CHOLANGIOPANCREATOGRAPHY (ERCP);  Surgeon: Irene Shipper, MD;  Location: [facility];  Service: Endoscopy;  Laterality: N/A;  . EUS N/A 04/28/2016   Procedure: UPPER ENDOSCOPIC ULTRASOUND (EUS) LINEAR;  Surgeon: [name], MD;  Location: [facility];  Service: Endoscopy;  Laterality: N/A;  . TONSILLECTOMY  1970  . TUBAL LIGATION  1980  . UMBILICAL HERNIA REPAIR  1995  . uterine polyp removal  2008    Prior to Admission medications   Medication Sig Start Date End Date Taking? Authorizing Provider  HYDROcodone-acetaminophen (NORCO) 10-325 MG tablet Take 1 tablet by mouth every 6 (six) hours as needed. 04/08/16  Yes [name], MD  insulin aspart (NOVOLOG) 100 UNIT/ML injection Inject 0-20 Units into the skin 3 (three) times daily with meals. . CBG 70 - 120: 0 units CBG 121 - 150: 3 units CBG 151 - 200: 4 units CBG 201 - 250: 7 units CBG 251 - 300: 11 units CBG 301 - 350: 15 units CBG 351 - 400: 20 units 03/21/16  Yes Mariel Aloe, MD  insulin glargine (LANTUS) 100 UNIT/ML injection Inject 0.3 mLs (30 Units total) into the skin daily. 03/22/16  Yes Mariel Aloe, MD  spironolactone (ALDACTONE) 25 MG tablet TAKE ONE TABLET EACH DAY 04/21/16  Yes Larey Dresser, MD  aspirin EC 81 MG tablet Take 1 tablet (81 mg total) by mouth daily. 07/08/14   Rowe Clack, MD  carvedilol (COREG) 25  MG tablet TAKE 1 TABLET BY MOUTH TWICE DAILY WITH FOOD 01/20/16   Jolaine Artist, MD  Cholecalciferol (VITAMIN D3) 2000 units capsule Take 1 capsule (2,000 Units total) by mouth daily. 06/17/15   Janith Lima, MD  citalopram (CELEXA) 20 MG tablet TAKE 1 TABLET (20 MG TOTAL) BY MOUTH DAILY. 01/27/16   [name], MD  DULoxetine (CYMBALTA) 60 MG capsule Take  1 capsule (60 mg total) by mouth daily. 09/25/15   [name], MD  ezetimibe (ZETIA) 10 MG tablet TAKE ONE TABLET BY MOUTH DAILY 02/03/16   [name], MD  gabapentin (NEURONTIN) 300 MG capsule TAKE TWO CAPSULES BY MOUTH FOUR TIMES A DAY 01/19/16   [name], MD  glucose blood (ONETOUCH VERIO) test strip Use as instructed up to 4 times per day 10/23/15   [name], MD  hydrALAZINE (APRESOLINE) 25 MG tablet TAKE 1 TABLET (25 MG TOTAL) BY MOUTH 3 (THREE) TIMES DAILY. 02/16/16   Jolaine Artist, MD  LYRICA 200 MG capsule TAKE ONE CAPSULE BY MOUTH TWICE A DAY 04/07/16   [name], MD  meloxicam (MOBIC) 7.5 MG tablet TAKE ONE TABLET BY MOUTH DAILY 03/11/16   [name], MD  metFORMIN (GLUCOPHAGE-XR) 500 MG 24 hr tablet Take 1,000 mg by mouth daily with breakfast.     Historical Provider, MD  metolazone (ZAROXOLYN) 2.5 MG tablet Take 1 tablet (2.5 mg total) by mouth 2 (two) times a week. 12/17/15   Kellyn Friar, PA-C  rOPINIRole (REQUIP XL) 4 MG 24 hr tablet TAKE 1 TABLET (4 MG TOTAL) BY MOUTH AT BEDTIME. 02/23/16   [name], MD  torsemide (DEMADEX) 100 MG tablet Take 1 tablet (100 mg total) by mouth daily. 03/21/16   Mariel Aloe, MD  vitamin B-12 (CYANOCOBALAMIN) 500 MCG tablet Take 500 mcg by mouth daily. Reported on 09/09/2015    Historical Provider, MD    Scheduled Meds: . aspirin EC  81 mg Oral Daily  . carvedilol  25 mg Oral BID WC  . citalopram  20 mg Oral Daily  . enoxaparin (LOVENOX) injection  40 mg Subcutaneous Q24H  . ezetimibe  10 mg Oral  Daily  . gabapentin  600 mg Oral QID  . insulin aspart  0-5 Units Subcutaneous QHS  . insulin aspart  0-9 Units Subcutaneous TID WC  . insulin glargine  8 Units Subcutaneous QHS  . piperacillin-tazobactam (ZOSYN)  IV  3.375 g Intravenous Q8H  . pregabalin  200 mg Oral BID  . rOPINIRole  4 mg Oral QHS   Infusions: . sodium chloride     PRN Meds: HYDROcodone-acetaminophen, ondansetron **OR** ondansetron (ZOFRAN) IV   Allergies as of 05/04/2016 - Review Complete 05/04/2016  Allergen Reaction Noted  . Cymbalta [duloxetine hcl] Other (See Comments) 10/22/2014  . Statins Other (See Comments) 09/10/2013  . Sulfa antibiotics Diarrhea 12/31/2010  . Levemir [insulin detemir] Itching 07/24/2012  . Morphine Other (See Comments)   . Zinc Swelling and Rash     Family History  Problem Relation Age of Onset  . Heart disease Mother   . Heart attack Mother 47  . Heart disease Father   . Heart attack Father 41  . Heart disease      family history  . Stomach cancer Paternal Grandmother   . Lung cancer Paternal Grandfather   . CVA      several aunts  . Heart attack      several aunts and an uncle  . Colon cancer Neg Hx     Social History   Social History  . Marital status: Married    Spouse name: N/A  . Number of children: N/A  . Years of education: N/A   Occupational History  . Not on file.   Social History Main Topics  . Smoking status: Never Smoker  . Smokeless tobacco: Never Used  .  Alcohol use No  . Drug use: No  . Sexual activity: No   Other Topics Concern  . Not on file   Social History Narrative   Lives with spouse and mother. Retired Network engineer, now housewife    REVIEW OF SYSTEMS: Constitutional:  Fatigue, weakness ENT:  No nose bleeds Pulm:  Stable, long-term dyspnea.  Non-productive cough CV:  No palpitations, no LE edema.  GU:  No hematuria, no frequency.  3 months of vaginal bleeding, says she used pads ~ 1 per day, like a light period.  GI:  No  dysphagia.  No bloody stool Heme:  Only unusual bleeding is vaginal.   Transfusions:  None per her recall Neuro:  Headache better today.  Derm:  Pruritus overall improved but persists.  Non-painful swelling and redness in lower legs Endocrine:  No sweats or chills.  No polyuria or dysuria Immunization:  Reviewed.   Travel:  None beyond local counties in last few months.    PHYSICAL EXAM: Vital signs in last 24 hours: Vitals:   05/05/16 0600 05/05/16 0647  BP: (!) 104/46 (!) 113/48  Pulse: 60 62  Resp: 18 20  Temp:  98.1 F (36.7 C)   Wt Readings from Last 3 Encounters:  05/04/16 108.9 kg (240 lb)  04/08/16 109.8 kg (242 lb)  03/15/16 105.7 kg (233 lb)    General: somnolent but arousable, obese WF.  Not obviously jaundiced.  Looks ill but not toxic.   Head:  Obese, symmetric faces  Eyes:  Slight scleral icterus. Ears:  Not HOH  Nose:  No discharge Mouth:  Dry oral MM, mouth breathing.  Good dentition.  Tongue midline.  Neck:  No mass, no TMG, no JVD Lungs:  Clear bil.  Mild cough.  Heart: RRR. 1 - 2/6 systolic murmer.  RRR.  s1, s2 present Abdomen:  Obese, soft.  BS hypoactive.  NT.  No HSM, bruits, masses, hernias.   Rectal: deferred.    Musc/Skeltl: no gross joint redness, swelling Extremities:   Neurologic:  No tremor or involuntary limb movement.  Somnolent but arousable.  Fair historian but oriented only to self and [place].  Could not tell me her birth date, the year/month.  However she is not obstunded and answers are appropriate.  Moves al 4 limbs, follows simple commands.   Skin:  No obvious jaundice Tattoos:  None Nodes:  No cervical adenopathy   Psych:  Cooperative, calm.     LAB RESULTS:  Recent Labs  05/05/16 0023  WBC 11.0*  HGB 11.4*  HCT 33.9*  PLT 347   BMET Lab Results  Component Value Date   NA 131 (L) 05/05/2016   NA 131 (L) 04/28/2016   NA 136 03/28/2016   K 4.3 05/05/2016   K 3.8 04/28/2016   K 3.2 (L) 03/28/2016   CL 86 (L)  05/05/2016   CL 90 (L) 04/28/2016   CL 96 (L) 03/28/2016   CO2 30 05/05/2016   CO2 29 04/28/2016   CO2 28 03/28/2016   GLUCOSE 416 (H) 05/05/2016   GLUCOSE 449 (H) 04/28/2016   GLUCOSE 325 (H) 03/28/2016   BUN 24 (H) 05/05/2016   BUN 34 (H) 04/28/2016   BUN 19 03/28/2016   CREATININE 1.38 (H) 05/05/2016   CREATININE 1.21 (H) 04/28/2016   CREATININE 1.09 (H) 03/28/2016   CALCIUM 8.5 (L) 05/05/2016   CALCIUM 9.3 04/28/2016   CALCIUM 8.5 (L) 03/28/2016   LFT  Recent Labs  05/05/16 0023  PROT 6.8  ALBUMIN 2.4*  AST 127*  ALT 69*  ALKPHOS 752*  BILITOT 3.7*   PT/INR Lab Results  Component Value Date   INR 0.97 05/05/2016   INR 0.94 03/15/2016   INR 1.09 11/14/2014   Lipase     Component Value Date/Time   LIPASE 27 05/05/2016 0023    Drugs of Abuse     Component Value Date/Time   LABOPIA POSITIVE (A) 05/05/2016 0142   COCAINSCRNUR NONE DETECTED 05/05/2016 0142   LABBENZ NONE DETECTED 05/05/2016 0142   AMPHETMU NONE DETECTED 05/05/2016 0142   THCU NONE DETECTED 05/05/2016 0142   LABBARB NONE DETECTED 05/05/2016 0142     RADIOLOGY STUDIES: Ct Head Wo Contrast  Result Date: 05/05/2016 CLINICAL DATA:  71 year old female with headache and confusion. EXAM: CT HEAD WITHOUT CONTRAST TECHNIQUE: Contiguous axial images were obtained from the base of the skull through the vertex without intravenous contrast. COMPARISON:  Head CT dated 10/22/2014 FINDINGS: Brain: The ventricles and sulci appropriate in size for patient's age. Minimal periventricular and deep white matter chronic microvascular ischemic changes noted. There is no acute intracranial hemorrhage. No mass effect or midline shift noted. There is no extra-axial fluid collection. Vascular: No hyperdense vessel or unexpected calcification. Skull: Normal. Negative for fracture or focal lesion. Sinuses/Orbits: No acute finding. Other: None. IMPRESSION: No acute intracranial pathology. Electronically Signed   By: Anner Crete M.D.   On: 05/05/2016 01:21   Ct Abdomen Pelvis W Contrast  Result Date: 05/05/2016 CLINICAL DATA:  Epigastric pain. Hx diabetes, GERD, lumbar disc degeneration, chronic diarrhea, uterine polyp removal, umbilical hernia, tubal ligation, and cholecystectomy. EXAM: CT ABDOMEN AND PELVIS WITH CONTRAST TECHNIQUE: Multidetector CT imaging of the abdomen and pelvis was performed using the standard protocol following bolus administration of intravenous contrast. CONTRAST:  122m ISOVUE-300 IOPAMIDOL (ISOVUE-300) INJECTION 61% COMPARISON:  ERCP 03/18/2016.  MRI 03/16/2016.  CT 03/15/2016. FINDINGS: Lower chest: Scattered emphysematous changes in the lung bases. Fibrosis or atelectasis in the lung bases. Calcification in the mitral valve annulus and coronary arteries. Hepatobiliary: Since the previous study, there is interval placement of a bile duct stent. Mild residual bile duct dilatation is less prominent than on prior study. Surgical absence of the gallbladder. No focal liver lesions. Pancreas: Diffuse pancreatic ductal dilatation with pancreatic parenchymal atrophy. Mass lesion demonstrated in the head of the pancreas on previous MRI is not well visualized at CT. No inflammatory stranding. Spleen: Focal lesion in the spleen measuring about 6 mm diameter is unchanged since previous study. This probably represents a small cyst. Additional subcentimeter lesion in the upper spleen is also unchanged. Adrenals/Urinary Tract: Adrenal glands are unremarkable. Kidneys are normal, without renal calculi, focal lesion, or hydronephrosis. Bladder is unremarkable. Stomach/Bowel: Stomach and small bowel are mostly decompressed. No small bowel distention or inflammatory infiltration. Colon is diffusely stool-filled without abnormal distention or inflammation. Scattered colonic diverticula. Appendix is not identified. Vascular/Lymphatic: Aortic atherosclerosis. No enlarged abdominal or pelvic lymph nodes. Reproductive:  Uterus and bilateral adnexa are unremarkable. Other: Small left inguinal hernia containing fat. No free air or free fluid in the abdomen. Musculoskeletal: Degenerative changes in the spine. No destructive bone lesions. IMPRESSION: Interval placement of a bile duct stent with some decompression of the bile ducts. Mild residual biliary dilatation. Pancreatic ductal dilatation and atrophy. Known pancreatic mass lesion is not well depicted at CT. No evidence of bowel obstruction or inflammation. Electronically Signed   By: WLucienne CapersM.D.   On: 05/05/2016 04:37   [facility]  Result Date: 05/05/2016 CLINICAL DATA:  Confusion, hyperglycemia, and headache tonight EXAM: PORTABLE CHEST 1 VIEW COMPARISON:  03/28/2016 FINDINGS: Shallow inspiration. The heart size and mediastinal contours are within normal limits. Both lungs are clear. The visualized skeletal structures are unremarkable. IMPRESSION: No active disease. Electronically Signed   By: Lucienne Capers M.D.   On: 05/05/2016 00:04     IMPRESSION:   *  Pancreatic cancer with obstructive jaundice.  LFTs improved post ERCP, stent placement 02/2016. Now with recurrent rise in LFTs.  WBCs elevated.  ? Stent obstruction/clog with cholangitis.  Day 1 Zosyn and single dose Rocephin.     *  AMS, moderate encephalopathy.  Head CT unrevealing, no physical deficits.  Suspect illness and narcotics are the causes.    *  IDDM type 2 with ongoing poorly controlled blood sugars  *  OSA.  Oxygen sats in 90s, no labored breathing and reassuring CXR.    *  Hypotension.      PLAN:     *  Arrange for ERCP, stent exchange tomorrow.  Continue Zosyn and prn pain meds  *  Going to increase IVF from 100 ml/hour to 125/hour.  CBC, CMET in AM.    *  Has appt with surgeon for 3/19 but do not see and appt pending with oncology on Epic.     Azucena Freed  05/05/2016, 9:35 AM Pager: [phone]    [facility] Attending   I have taken an interval  history, reviewed the chart and examined the patient. I agree with the Advanced Practitioner's note, impression and recommendations.   Biliary sepsis/cholangitis from stent occlusion is suspected - she is better w/ Abx Stent replacement tomorrow  Gatha Mayer, MD, [facility] [phone] (pager) [phone] after 5 PM, weekends and holidays  05/05/2016 5:03 PM

## 2016-05-05 NOTE — Progress Notes (Addendum)
Nutrition Brief Note  Patient identified on the Malnutrition Screening Tool (MST) Report  Wt Readings from Last 15 Encounters:  05/04/16 240 lb (108.9 kg)  04/08/16 242 lb (109.8 kg)  03/15/16 233 lb (105.7 kg)  02/04/16 237 lb (107.5 kg)  01/04/16 246 lb 6.4 oz (111.8 kg)  12/08/15 255 lb 3.2 oz (115.8 kg)  11/19/15 236 lb 8 oz (107.3 kg)  11/17/15 248 lb (112.5 kg)  10/27/15 255 lb (115.7 kg)  10/22/15 259 lb (117.5 kg)  10/09/15 259 lb 9.6 oz (117.8 kg)  09/09/15 255 lb (115.7 kg)  08/25/15 256 lb (116.1 kg)  07/10/15 251 lb (113.9 kg)  07/10/15 250 lb 8 oz (113.6 kg)   Melissa Myers is a 71 y.o. female with a past medical history significant for IDDM, HFpEF, HTN, and recent pancreatic mass followed by [facility] underwent biliary stent 04/28/16 who presents with fever, abdominal pain, malaise, confusion.  Pt admitted with cholangitis.   Pt very groggy at time of visit. When asked about appetite, pt states "I don't like food right now".   Nutrition-Focused physical exam completed. Findings are no fat depletion, no muscle depletion, and mild edema.   Wt hx reviewed. Noted a steady decline in weight over the past 2 years, however, this is favorable given pt's morbid obesity. Noted no significant wt changes over the past year.   Body mass index is 50.16 kg/m. Patient meets criteria for extreme obesity, class III based on current BMI.   Current diet order is clear liquid, patient is consuming approximately n/a% of meals at this time. Labs and medications reviewed.   No nutrition interventions warranted at this time. If nutrition issues arise, please consult RD.   Seleen Walter A. Jimmye Norman, RD, LDN, CDE Pager: [phone] After hours Pager: [phone]

## 2016-05-05 NOTE — Progress Notes (Signed)
Pharmacy Antibiotic Note  Melissa Myers is a 71 y.o. female admitted on 05/04/2016 with concern for intra-abdominal infection.  Pharmacy has been consulted for Zosyn dosing.  Plan: Zosyn 3.375g IV q8h (4 hour infusion).  Height: 4\' 10"  (147.3 cm) Weight: 240 lb (108.9 kg) IBW/kg (Calculated) : 40.9  Temp (24hrs), Avg:99.7 F (37.6 C), Min:98.6 F (37 C), Max:101.5 F (38.6 C)   Recent Labs Lab 04/28/16 1015 05/05/16 0023 05/05/16 0355  WBC  --  11.0*  --   CREATININE 1.21* 1.38*  --   LATICACIDVEN  --   --  1.71    Estimated Creatinine Clearance: 40.8 mL/min (A) (by C-G formula based on SCr of 1.38 mg/dL (H)).    Allergies  Allergen Reactions  . Cymbalta [Duloxetine Hcl] Other (See Comments)    Restless leg syndrome  . Statins Other (See Comments)    Statin drugs cause muscle pain / "muscle damage"--was told by MD not to take  . Sulfa Antibiotics Diarrhea  . Levemir [Insulin Detemir] Itching  . Morphine Other (See Comments)    GI upset and headaches  . Zinc Swelling and Rash     Thank you for allowing pharmacy to be a part of this patient's care.  Wynona Neat, PharmD, BCPS  05/05/2016 6:05 AM

## 2016-05-05 NOTE — Progress Notes (Addendum)
Inpatient Diabetes Program Recommendations  AACE/ADA: New Consensus Statement on Inpatient Glycemic Control (2015)  Target Ranges:  Prepandial:   less than 140 mg/dL      Peak postprandial:   less than 180 mg/dL (1-2 hours)      Critically ill patients:  140 - 180 mg/dL   Lab Results  Component Value Date   GLUCAP 333 (H) 05/05/2016   HGBA1C 14.1 Repeated and verified X2. (H) 02/04/2016    Review of Glycemic Control:  Results for REILLEY, VALENTINE (MRN [id number]) as of 05/05/2016 11:19  Ref. Range 05/04/2016 23:40 05/05/2016 06:52 05/05/2016 08:44  Glucose-Capillary Latest Ref Range: 65 - 99 mg/dL 403 (H) 327 (H) 333 (H)   Diabetes history: IDDM per problem list Outpatient Diabetes medications: Lantus 30 units daily, Metformin 1000 mg daily Current orders for Inpatient glycemic control:  Lantus 8 units q HS, Novolog sensitive tid with meals and HS  Inpatient Diabetes Program Recommendations:    Consider increasing Lantus to 8 units bid.  Also may consider q 4 hour Novolog correction.    Thanks, Adah Perl, RN, BC-ADM Inpatient Diabetes Coordinator Pager [phone] (8a-5p)  11:35 Text page sent to Dr. Olen Pel.  Verbal order received.

## 2016-05-06 ENCOUNTER — Inpatient Hospital Stay (HOSPITAL_COMMUNITY): Payer: PPO | Admitting: Certified Registered Nurse Anesthetist

## 2016-05-06 ENCOUNTER — Encounter (HOSPITAL_COMMUNITY): Payer: Self-pay | Admitting: Student

## 2016-05-06 ENCOUNTER — Encounter (HOSPITAL_COMMUNITY)
Admission: EM | Disposition: A | Discharge status: Skilled Nursing Facility | Payer: Self-pay | Source: Home / Self Care | Attending: Internal Medicine

## 2016-05-06 ENCOUNTER — Inpatient Hospital Stay (HOSPITAL_COMMUNITY): Payer: PPO

## 2016-05-06 ENCOUNTER — Telehealth: Payer: Self-pay | Admitting: Hematology

## 2016-05-06 DIAGNOSIS — K831 Obstruction of bile duct: Secondary | ICD-10-CM

## 2016-05-06 DIAGNOSIS — C250 Malignant neoplasm of head of pancreas: Secondary | ICD-10-CM

## 2016-05-06 HISTORY — PX: ENDOSCOPIC RETROGRADE CHOLANGIOPANCREATOGRAPHY (ERCP) WITH PROPOFOL: SHX5810

## 2016-05-06 HISTORY — PX: BILIARY STENT PLACEMENT: SHX5538

## 2016-05-06 LAB — COMPREHENSIVE METABOLIC PANEL
ALK PHOS: 712 U/L — AB (ref 38–126)
ALT: 59 U/L — ABNORMAL HIGH (ref 14–54)
AST: 130 U/L — ABNORMAL HIGH (ref 15–41)
Albumin: 1.9 g/dL — ABNORMAL LOW (ref 3.5–5.0)
Anion gap: 13 (ref 5–15)
BILIRUBIN TOTAL: 6.7 mg/dL — AB (ref 0.3–1.2)
BUN: 13 mg/dL (ref 6–20)
CALCIUM: 8 mg/dL — AB (ref 8.9–10.3)
CO2: 34 mmol/L — ABNORMAL HIGH (ref 22–32)
CREATININE: 1 mg/dL (ref 0.44–1.00)
Chloride: 91 mmol/L — ABNORMAL LOW (ref 101–111)
GFR calc non Af Amer: 56 mL/min — ABNORMAL LOW (ref >60)
GLUCOSE: 212 mg/dL — AB (ref 65–99)
Potassium: 2.9 mmol/L — ABNORMAL LOW (ref 3.5–5.1)
Sodium: 138 mmol/L (ref 135–145)
TOTAL PROTEIN: 5.6 g/dL — AB (ref 6.5–8.1)

## 2016-05-06 LAB — GLUCOSE, CAPILLARY
GLUCOSE-CAPILLARY: 215 mg/dL — AB (ref 65–99)
GLUCOSE-CAPILLARY: 328 mg/dL — AB (ref 65–99)
Glucose-Capillary: 224 mg/dL — ABNORMAL HIGH (ref 65–99)
Glucose-Capillary: 243 mg/dL — ABNORMAL HIGH (ref 65–99)

## 2016-05-06 LAB — CBC
HCT: 31.4 % — ABNORMAL LOW (ref 36.0–46.0)
HEMOGLOBIN: 10 g/dL — AB (ref 12.0–15.0)
MCH: 27.6 pg (ref 26.0–34.0)
MCHC: 31.8 g/dL (ref 30.0–36.0)
MCV: 86.7 fL (ref 78.0–100.0)
PLATELETS: 327 10*3/uL (ref 150–400)
RBC: 3.62 MIL/uL — AB (ref 3.87–5.11)
RDW: 16.5 % — ABNORMAL HIGH (ref 11.5–15.5)
WBC: 7.9 10*3/uL (ref 4.0–10.5)

## 2016-05-06 SURGERY — ENDOSCOPIC RETROGRADE CHOLANGIOPANCREATOGRAPHY (ERCP) WITH PROPOFOL
Anesthesia: General

## 2016-05-06 MED ORDER — GLUCAGON HCL RDNA (DIAGNOSTIC) 1 MG IJ SOLR
INTRAMUSCULAR | Status: AC
  Filled 2016-05-06: qty 1

## 2016-05-06 MED ORDER — EPHEDRINE SULFATE 50 MG/ML IJ SOLN
INTRAMUSCULAR | Status: DC | PRN
  Administered 2016-05-06: 15 mg via INTRAVENOUS

## 2016-05-06 MED ORDER — INDOMETHACIN 50 MG RE SUPP
100.0000 mg | Freq: Once | RECTAL | Status: DC
  Filled 2016-05-06: qty 2

## 2016-05-06 MED ORDER — ONDANSETRON HCL 4 MG/2ML IJ SOLN
INTRAMUSCULAR | Status: DC | PRN
  Administered 2016-05-06: 4 mg via INTRAVENOUS

## 2016-05-06 MED ORDER — HYDROMORPHONE HCL 2 MG/ML IJ SOLN
0.5000 mg | Freq: Once | INTRAMUSCULAR | Status: AC
  Administered 2016-05-06: 0.5 mg via INTRAVENOUS
  Filled 2016-05-06: qty 1

## 2016-05-06 MED ORDER — SUGAMMADEX SODIUM 200 MG/2ML IV SOLN
INTRAVENOUS | Status: DC | PRN
  Administered 2016-05-06: 200 mg via INTRAVENOUS

## 2016-05-06 MED ORDER — LACTATED RINGERS IV SOLN
INTRAVENOUS | Status: DC | PRN
  Administered 2016-05-06 (×2): via INTRAVENOUS

## 2016-05-06 MED ORDER — LIDOCAINE HCL (CARDIAC) 20 MG/ML IV SOLN
INTRAVENOUS | Status: DC | PRN
  Administered 2016-05-06: 100 mg via INTRAVENOUS

## 2016-05-06 MED ORDER — PHENYLEPHRINE 40 MCG/ML (10ML) SYRINGE FOR IV PUSH (FOR BLOOD PRESSURE SUPPORT)
PREFILLED_SYRINGE | INTRAVENOUS | Status: DC | PRN
  Administered 2016-05-06 (×2): 120 ug via INTRAVENOUS
  Administered 2016-05-06: 80 ug via INTRAVENOUS

## 2016-05-06 MED ORDER — PHENYLEPHRINE HCL 10 MG/ML IJ SOLN
INTRAVENOUS | Status: DC | PRN
  Administered 2016-05-06: 40 ug/min via INTRAVENOUS

## 2016-05-06 MED ORDER — ROCURONIUM BROMIDE 100 MG/10ML IV SOLN
INTRAVENOUS | Status: DC | PRN
  Administered 2016-05-06: 30 mg via INTRAVENOUS

## 2016-05-06 MED ORDER — POTASSIUM CHLORIDE 2 MEQ/ML IV SOLN
30.0000 meq | INTRAVENOUS | Status: AC
  Administered 2016-05-06: 30 meq via INTRAVENOUS
  Filled 2016-05-06 (×2): qty 15

## 2016-05-06 MED ORDER — HYDROMORPHONE HCL 2 MG/ML IJ SOLN
0.5000 mg | INTRAMUSCULAR | Status: DC | PRN
  Administered 2016-05-06: 0.5 mg via INTRAVENOUS
  Filled 2016-05-06: qty 1

## 2016-05-06 MED ORDER — IOPAMIDOL (ISOVUE-300) INJECTION 61%
INTRAVENOUS | Status: DC | PRN
  Administered 2016-05-06: 75 mL via INTRAVENOUS

## 2016-05-06 MED ORDER — IOPAMIDOL (ISOVUE-300) INJECTION 61%
INTRAVENOUS | Status: AC
  Filled 2016-05-06: qty 50

## 2016-05-06 MED ORDER — INDOMETHACIN 50 MG RE SUPP
RECTAL | Status: AC
  Filled 2016-05-06: qty 2

## 2016-05-06 MED ORDER — SODIUM CHLORIDE 0.9 % IV SOLN: INTRAVENOUS | Status: DC

## 2016-05-06 MED ORDER — FENTANYL CITRATE (PF) 100 MCG/2ML IJ SOLN
INTRAMUSCULAR | Status: DC | PRN
  Administered 2016-05-06: 110 ug via INTRAVENOUS

## 2016-05-06 MED ORDER — ENOXAPARIN SODIUM 40 MG/0.4ML ~~LOC~~ SOLN
40.0000 mg | SUBCUTANEOUS | Status: DC
  Administered 2016-05-08 – 2016-05-13 (×6): 40 mg via SUBCUTANEOUS
  Filled 2016-05-06 (×6): qty 0.4

## 2016-05-06 NOTE — Anesthesia Preprocedure Evaluation (Addendum)
Anesthesia Evaluation  Patient identified by MRN, date of birth, ID band Patient unresponsive    Reviewed: Allergy & Precautions, NPO status , Patient's Chart, lab work & pertinent test results  Airway Mallampati: III     Mouth opening: Limited Mouth Opening  Dental no notable dental hx.    Pulmonary    Pulmonary exam normal        Cardiovascular hypertension, Normal cardiovascular exam     Neuro/Psych    GI/Hepatic   Endo/Other  diabetesMorbid obesity  Renal/GU      Musculoskeletal   Abdominal (+) + obese,   Peds  Hematology   Anesthesia Other Findings   Reproductive/Obstetrics                            Anesthesia Physical Anesthesia Plan  ASA: III  Anesthesia Plan: General   Post-op Pain Management:    Induction: Intravenous  Airway Management Planned: Oral ETT  Additional Equipment:   Intra-op Plan:   Post-operative Plan: Extubation in OR  Informed Consent: I have reviewed the patients History and Physical, chart, labs and discussed the procedure including the risks, benefits and alternatives for the proposed anesthesia with the patient or authorized representative who has indicated his/her understanding and acceptance.     Plan Discussed with: CRNA and Surgeon  Anesthesia Plan Comments:         Anesthesia Quick Evaluation

## 2016-05-06 NOTE — Telephone Encounter (Signed)
Received a call from Dr. Doyle Askew from the hospital about getting the pt setup for an oncology appt. Cld Dr. Burr Medico who in turn spoke to Dr. Doyle Askew. Appt scheduled for the pt to see Dr. Burr Medico on 3/20 at 145pm. Will mail a letter with the appt date and time.

## 2016-05-06 NOTE — Interval H&P Note (Signed)
History and Physical Interval Note:  05/06/2016 12:21 PM  Melissa Myers  has presented today for surgery, with the diagnosis of jaundice, cholangitis, pancreatic cancer, biliary stent placed 02/2016  The various methods of treatment have been discussed with the patient and family. After consideration of risks, benefits and other options for treatment, the patient has consented to  Procedure(s): ENDOSCOPIC RETROGRADE CHOLANGIOPANCREATOGRAPHY (ERCP) WITH PROPOFOL (N/A) BILIARY STENT PLACEMENT (N/A) as a surgical intervention .  The patient's history has been reviewed, patient examined, no change in status, stable for surgery.  I have reviewed the patient's chart and labs.  Questions were answered to the patient's satisfaction.    She is febrile again and is on $L [place] I think she needs the ERCP and stent change as infected and clogged biliary stent is likely source of her problems.  Silvano Rusk

## 2016-05-06 NOTE — Anesthesia Postprocedure Evaluation (Addendum)
Anesthesia Post Note  Patient: Melissa Myers  Procedure(s) Performed: Procedure(s) (LRB): ENDOSCOPIC RETROGRADE CHOLANGIOPANCREATOGRAPHY (ERCP) WITH PROPOFOL (N/A) BILIARY STENT PLACEMENT (N/A)  Patient location during evaluation: Endoscopy Anesthesia Type: General Level of consciousness: sedated Pain management: pain level controlled Vital Signs Assessment: post-procedure vital signs reviewed and stable Respiratory status: spontaneous breathing Cardiovascular status: stable Postop Assessment: no signs of nausea or vomiting Anesthetic complications: no        Last Vitals:  Vitals:   05/06/16 1441 05/06/16 1450  BP: (!) 115/44 (!) 121/45  Pulse: 68 67  Resp: 18 18  Temp: (!) 38.5 C     Last Pain:  Vitals:   05/06/16 1441  TempSrc: Axillary  PainSc:    Pain Goal:                 Noelani Harbach JR,JOHN Hermena Swint

## 2016-05-06 NOTE — Anesthesia Procedure Notes (Signed)
Procedure Name: Intubation Date/Time: 05/06/2016 1:09 PM Performed by: Carney Living Pre-anesthesia Checklist: Patient identified, Emergency Drugs available, Suction available, Patient being monitored and Timeout performed Patient Re-evaluated:Patient Re-evaluated prior to inductionOxygen Delivery Method: Circle system utilized Preoxygenation: Pre-oxygenation with 100% oxygen Intubation Type: IV induction Ventilation: Mask ventilation without difficulty and Oral airway inserted - appropriate to patient size Laryngoscope Size: Mac and 4 Grade View: Grade I Tube type: Subglottic suction tube Tube size: 7.5 mm Number of attempts: 1 Airway Equipment and Method: Stylet Placement Confirmation: ETT inserted through vocal cords under direct vision,  positive ETCO2 and breath sounds checked- equal and bilateral Secured at: 22 cm Tube secured with: Tape Dental Injury: Teeth and Oropharynx as per pre-operative assessment

## 2016-05-06 NOTE — Progress Notes (Signed)
Inpatient Diabetes Program Recommendations  AACE/ADA: New Consensus Statement on Inpatient Glycemic Control (2015)  Target Ranges:  Prepandial:   less than 140 mg/dL      Peak postprandial:   less than 180 mg/dL (1-2 hours)      Critically ill patients:  140 - 180 mg/dL   Lab Results  Component Value Date   GLUCAP 224 (H) 05/06/2016   HGBA1C 14.1 Repeated and verified X2. (H) 02/04/2016    Diabetes history: IDDM per problem list Outpatient Diabetes medications: Lantus 30 units daily, Metformin 1000 mg daily Current orders for Inpatient glycemic control:  Lantus 8 units q HS, Novolog sensitive tid with meals and HS  Inpatient Diabetes Program Recommendations:    Please consider increasing Lantus to 13 units bid.   Thank you,  Windy Carina, RN, MSN Diabetes Coordinator Inpatient Diabetes Program [phone] (Team Pager)

## 2016-05-06 NOTE — H&P (View-Only) (Signed)
[facility] Consult: 9:35 AM 05/05/2016  LOS: 0 days    Referring Provider: Dr Doyle Askew  Primary Care Physician:  [name], MD Primary Gastroenterologist:  Dr. Scarlette Shorts    Reason for Consultation:  Cholangitis, recurrent jaundice.    HPI: Melissa Myers is a 71 y.o. female.  PMH poorly controlled DM2 despite insulin.  Diabetic neuropathy. Chronic leg pain. OSA, declines CPAP but home oxygen prn.  Stasis dermatitis .  IBS-D.  s/p cholecystectomy 1997 and  Gastritis on EGD 1994.  Hemorrhoids and normal random biopsies on o/w normal colonoscopies 1994, 2003, 2013.  Tubular adenoma on 11/2012 Colonoscopy.  2009 ERCP with 12 mm sphincterotomy due to jaundice, with ? filling defect but no stones on bile duct sweep.   Diagnosed with obstructive jaundice at admission 1/23 - 03/21/2016.  No obvious mass on CT which showed borderline biliary duct dilatation and moderate pancreatic duct dilatation.  MRCP with mass at uncinate, double duct sign, no liver mets or adenopathy.   LFTs, pruritus, jaundice improved post 03/18/16 ERCP with biliary stent placement across malignant appearing distal CBD stricture.  CA 19-9 107.  Signed out of rehab after 1 day.    EUS 04/28/16: stent in good position.  2.9 cm mass in pancreatic uncinate obstructing main PD and bile duct. Mass does not directly abut the SMA, SMV, PV or celiac trunk and no clear peripancreatic adenopathy.  Adenocarcinoma on FNA cytology.  Planned referral to Dr Barry Dienes (appt 3/19, 2:30 arrival) and medical oncology (do not see pending appt).    Presented to ED 03/27/16 with classic flu sxs. Tested + for influenza A and sent home on Tamiflu.  Returned to ED 2/6 with persistent flu sxs.  Dosed with Rocephin and Azithromycin x 1 and discharged on Levaquin but did not  fill the Rx.  Blood sugars into 600s ~ 04/01/16. Restarted on Lyrica, Gabapentin for leg pain at PMD office visit 04/08/16.    Issues with LE cellulitis starting > 1week ago.  Ongoing abdominal bloating and pain since EUS.    ++++++++++++++++++++++++++++++++++++++++++++++++++++++++++++++++++++++++++++++++  Admitted from ED where she presented with abd pain, n/v, headache.  Confused in ED, but had been given Vicodin, as well as 243 ASA at home.  Says upper abd pain never really resolved but went from milder to 8/10 in recent days.  Anorexia is chronic, suspects 9# weight loss, no nausea.   + chills and sweats.  Sleeping a lot and feels rundown.  Stable, non-bloody loose stools 3 x day per her norm.    LFTs compared with 1 week ago as follows: T bili 1.3 >> 3.7.  Alk phos 148 >> 753.  AST/ALT 27/23 >> 127/69.  Albumin 3.3 >> 2.4.  Ammonia is 51 (50 seven weeks ago).  Lipase normal.  Coags normal.   WBCs 11.5, previously normal on 03/28/16.   Hgb up from 8.4 to 11.4 over 6 weeks.   Glucoses in low to mid 400s. Na 131.  AKI: 24/1.3.  CT abdomen and pelvis 05/05/16:  Mild residual biliary dilatation post  stenting. Pancreatic ductal dilatation and atrophy. Known pancreatic mass lesion is not well depicted at CT. No bowel obstruction or inflammation. CT Head 05/05/16:  No pathology.  CXR unremarkable.    Received Rocephin x 1 and Zosyn initiated.  Abdominal pain improved with Hydrocodone.  Mental status somewhat improved but remains disoriented.     Past Medical History:  Diagnosis Date  . Anemia   . Cellulitis    LOWER EXTREMITIES  . Chronic diarrhea    a/w nausea - felt related to IBS  . Deaf    left side only  . Diastolic CHF ([place])   . Disc degeneration, lumbar   . GERD (gastroesophageal reflux disease)   . Hyperlipidemia    hx rhabdo on statins  . Hypertension   . Neuropathy ([facility])    feet, toes and fingers  . On home oxygen therapy    uses oxygen 2 liters min per [place] at night and prn during  day  . OSA (obstructive sleep apnea)    05/2009 sleep study - refuses CPAP  . Osteoarthritis   . RLS (restless legs syndrome)   . Shortness of breath    chronic  . Stasis dermatitis   . Type II or unspecified type diabetes mellitus without mention of complication, not stated as uncontrolled    insulin dep    Past Surgical History:  Procedure Laterality Date  . CHOLECYSTECTOMY  1997  . COLONOSCOPY N/A 12/03/2012   Procedure: COLONOSCOPY;  Surgeon: Lafayette Dragon, MD;  Location: [facility];  Service: Endoscopy;  Laterality: N/A;  . ERCP N/A 03/18/2016   Procedure: ENDOSCOPIC RETROGRADE CHOLANGIOPANCREATOGRAPHY (ERCP);  Surgeon: Irene Shipper, MD;  Location: [facility];  Service: Endoscopy;  Laterality: N/A;  . EUS N/A 04/28/2016   Procedure: UPPER ENDOSCOPIC ULTRASOUND (EUS) LINEAR;  Surgeon: [name], MD;  Location: [facility];  Service: Endoscopy;  Laterality: N/A;  . TONSILLECTOMY  1970  . TUBAL LIGATION  1980  . UMBILICAL HERNIA REPAIR  1995  . uterine polyp removal  2008    Prior to Admission medications   Medication Sig Start Date End Date Taking? Authorizing Provider  HYDROcodone-acetaminophen (NORCO) 10-325 MG tablet Take 1 tablet by mouth every 6 (six) hours as needed. 04/08/16  Yes [name], MD  insulin aspart (NOVOLOG) 100 UNIT/ML injection Inject 0-20 Units into the skin 3 (three) times daily with meals. . CBG 70 - 120: 0 units CBG 121 - 150: 3 units CBG 151 - 200: 4 units CBG 201 - 250: 7 units CBG 251 - 300: 11 units CBG 301 - 350: 15 units CBG 351 - 400: 20 units 03/21/16  Yes Mariel Aloe, MD  insulin glargine (LANTUS) 100 UNIT/ML injection Inject 0.3 mLs (30 Units total) into the skin daily. 03/22/16  Yes Mariel Aloe, MD  spironolactone (ALDACTONE) 25 MG tablet TAKE ONE TABLET EACH DAY 04/21/16  Yes Larey Dresser, MD  aspirin EC 81 MG tablet Take 1 tablet (81 mg total) by mouth daily. 07/08/14   Rowe Clack, MD  carvedilol (COREG) 25  MG tablet TAKE 1 TABLET BY MOUTH TWICE DAILY WITH FOOD 01/20/16   Jolaine Artist, MD  Cholecalciferol (VITAMIN D3) 2000 units capsule Take 1 capsule (2,000 Units total) by mouth daily. 06/17/15   Janith Lima, MD  citalopram (CELEXA) 20 MG tablet TAKE 1 TABLET (20 MG TOTAL) BY MOUTH DAILY. 01/27/16   [name], MD  DULoxetine (CYMBALTA) 60 MG capsule Take  1 capsule (60 mg total) by mouth daily. 09/25/15   [name], MD  ezetimibe (ZETIA) 10 MG tablet TAKE ONE TABLET BY MOUTH DAILY 02/03/16   [name], MD  gabapentin (NEURONTIN) 300 MG capsule TAKE TWO CAPSULES BY MOUTH FOUR TIMES A DAY 01/19/16   [name], MD  glucose blood (ONETOUCH VERIO) test strip Use as instructed up to 4 times per day 10/23/15   [name], MD  hydrALAZINE (APRESOLINE) 25 MG tablet TAKE 1 TABLET (25 MG TOTAL) BY MOUTH 3 (THREE) TIMES DAILY. 02/16/16   Jolaine Artist, MD  LYRICA 200 MG capsule TAKE ONE CAPSULE BY MOUTH TWICE A DAY 04/07/16   [name], MD  meloxicam (MOBIC) 7.5 MG tablet TAKE ONE TABLET BY MOUTH DAILY 03/11/16   [name], MD  metFORMIN (GLUCOPHAGE-XR) 500 MG 24 hr tablet Take 1,000 mg by mouth daily with breakfast.     Historical Provider, MD  metolazone (ZAROXOLYN) 2.5 MG tablet Take 1 tablet (2.5 mg total) by mouth 2 (two) times a week. 12/17/15   Ciela Friar, PA-C  rOPINIRole (REQUIP XL) 4 MG 24 hr tablet TAKE 1 TABLET (4 MG TOTAL) BY MOUTH AT BEDTIME. 02/23/16   [name], MD  torsemide (DEMADEX) 100 MG tablet Take 1 tablet (100 mg total) by mouth daily. 03/21/16   Mariel Aloe, MD  vitamin B-12 (CYANOCOBALAMIN) 500 MCG tablet Take 500 mcg by mouth daily. Reported on 09/09/2015    Historical Provider, MD    Scheduled Meds: . aspirin EC  81 mg Oral Daily  . carvedilol  25 mg Oral BID WC  . citalopram  20 mg Oral Daily  . enoxaparin (LOVENOX) injection  40 mg Subcutaneous Q24H  . ezetimibe  10 mg Oral  Daily  . gabapentin  600 mg Oral QID  . insulin aspart  0-5 Units Subcutaneous QHS  . insulin aspart  0-9 Units Subcutaneous TID WC  . insulin glargine  8 Units Subcutaneous QHS  . piperacillin-tazobactam (ZOSYN)  IV  3.375 g Intravenous Q8H  . pregabalin  200 mg Oral BID  . rOPINIRole  4 mg Oral QHS   Infusions: . sodium chloride     PRN Meds: HYDROcodone-acetaminophen, ondansetron **OR** ondansetron (ZOFRAN) IV   Allergies as of 05/04/2016 - Review Complete 05/04/2016  Allergen Reaction Noted  . Cymbalta [duloxetine hcl] Other (See Comments) 10/22/2014  . Statins Other (See Comments) 09/10/2013  . Sulfa antibiotics Diarrhea 12/31/2010  . Levemir [insulin detemir] Itching 07/24/2012  . Morphine Other (See Comments)   . Zinc Swelling and Rash     Family History  Problem Relation Age of Onset  . Heart disease Mother   . Heart attack Mother 65  . Heart disease Father   . Heart attack Father 62  . Heart disease      family history  . Stomach cancer Paternal Grandmother   . Lung cancer Paternal Grandfather   . CVA      several aunts  . Heart attack      several aunts and an uncle  . Colon cancer Neg Hx     Social History   Social History  . Marital status: Married    Spouse name: N/A  . Number of children: N/A  . Years of education: N/A   Occupational History  . Not on file.   Social History Main Topics  . Smoking status: Never Smoker  . Smokeless tobacco: Never Used  .  Alcohol use No  . Drug use: No  . Sexual activity: No   Other Topics Concern  . Not on file   Social History Narrative   Lives with spouse and mother. Retired Network engineer, now housewife    REVIEW OF SYSTEMS: Constitutional:  Fatigue, weakness ENT:  No nose bleeds Pulm:  Stable, long-term dyspnea.  Non-productive cough CV:  No palpitations, no LE edema.  GU:  No hematuria, no frequency.  3 months of vaginal bleeding, says she used pads ~ 1 per day, like a light period.  GI:  No  dysphagia.  No bloody stool Heme:  Only unusual bleeding is vaginal.   Transfusions:  None per her recall Neuro:  Headache better today.  Derm:  Pruritus overall improved but persists.  Non-painful swelling and redness in lower legs Endocrine:  No sweats or chills.  No polyuria or dysuria Immunization:  Reviewed.   Travel:  None beyond local counties in last few months.    PHYSICAL EXAM: Vital signs in last 24 hours: Vitals:   05/05/16 0600 05/05/16 0647  BP: (!) 104/46 (!) 113/48  Pulse: 60 62  Resp: 18 20  Temp:  98.1 F (36.7 C)   Wt Readings from Last 3 Encounters:  05/04/16 108.9 kg (240 lb)  04/08/16 109.8 kg (242 lb)  03/15/16 105.7 kg (233 lb)    General: somnolent but arousable, obese WF.  Not obviously jaundiced.  Looks ill but not toxic.   Head:  Obese, symmetric faces  Eyes:  Slight scleral icterus. Ears:  Not HOH  Nose:  No discharge Mouth:  Dry oral MM, mouth breathing.  Good dentition.  Tongue midline.  Neck:  No mass, no TMG, no JVD Lungs:  Clear bil.  Mild cough.  Heart: RRR. 1 - 2/6 systolic murmer.  RRR.  s1, s2 present Abdomen:  Obese, soft.  BS hypoactive.  NT.  No HSM, bruits, masses, hernias.   Rectal: deferred.    Musc/Skeltl: no gross joint redness, swelling Extremities:   Neurologic:  No tremor or involuntary limb movement.  Somnolent but arousable.  Fair historian but oriented only to self and [place].  Could not tell me her birth date, the year/month.  However she is not obstunded and answers are appropriate.  Moves al 4 limbs, follows simple commands.   Skin:  No obvious jaundice Tattoos:  None Nodes:  No cervical adenopathy   Psych:  Cooperative, calm.     LAB RESULTS:  Recent Labs  05/05/16 0023  WBC 11.0*  HGB 11.4*  HCT 33.9*  PLT 347   BMET Lab Results  Component Value Date   NA 131 (L) 05/05/2016   NA 131 (L) 04/28/2016   NA 136 03/28/2016   K 4.3 05/05/2016   K 3.8 04/28/2016   K 3.2 (L) 03/28/2016   CL 86 (L)  05/05/2016   CL 90 (L) 04/28/2016   CL 96 (L) 03/28/2016   CO2 30 05/05/2016   CO2 29 04/28/2016   CO2 28 03/28/2016   GLUCOSE 416 (H) 05/05/2016   GLUCOSE 449 (H) 04/28/2016   GLUCOSE 325 (H) 03/28/2016   BUN 24 (H) 05/05/2016   BUN 34 (H) 04/28/2016   BUN 19 03/28/2016   CREATININE 1.38 (H) 05/05/2016   CREATININE 1.21 (H) 04/28/2016   CREATININE 1.09 (H) 03/28/2016   CALCIUM 8.5 (L) 05/05/2016   CALCIUM 9.3 04/28/2016   CALCIUM 8.5 (L) 03/28/2016   LFT  Recent Labs  05/05/16 0023  PROT 6.8  ALBUMIN 2.4*  AST 127*  ALT 69*  ALKPHOS 752*  BILITOT 3.7*   PT/INR Lab Results  Component Value Date   INR 0.97 05/05/2016   INR 0.94 03/15/2016   INR 1.09 11/14/2014   Lipase     Component Value Date/Time   LIPASE 27 05/05/2016 0023    Drugs of Abuse     Component Value Date/Time   LABOPIA POSITIVE (A) 05/05/2016 0142   COCAINSCRNUR NONE DETECTED 05/05/2016 0142   LABBENZ NONE DETECTED 05/05/2016 0142   AMPHETMU NONE DETECTED 05/05/2016 0142   THCU NONE DETECTED 05/05/2016 0142   LABBARB NONE DETECTED 05/05/2016 0142     RADIOLOGY STUDIES: Ct Head Wo Contrast  Result Date: 05/05/2016 CLINICAL DATA:  71 year old female with headache and confusion. EXAM: CT HEAD WITHOUT CONTRAST TECHNIQUE: Contiguous axial images were obtained from the base of the skull through the vertex without intravenous contrast. COMPARISON:  Head CT dated 10/22/2014 FINDINGS: Brain: The ventricles and sulci appropriate in size for patient's age. Minimal periventricular and deep white matter chronic microvascular ischemic changes noted. There is no acute intracranial hemorrhage. No mass effect or midline shift noted. There is no extra-axial fluid collection. Vascular: No hyperdense vessel or unexpected calcification. Skull: Normal. Negative for fracture or focal lesion. Sinuses/Orbits: No acute finding. Other: None. IMPRESSION: No acute intracranial pathology. Electronically Signed   By: Anner Crete M.D.   On: 05/05/2016 01:21   Ct Abdomen Pelvis W Contrast  Result Date: 05/05/2016 CLINICAL DATA:  Epigastric pain. Hx diabetes, GERD, lumbar disc degeneration, chronic diarrhea, uterine polyp removal, umbilical hernia, tubal ligation, and cholecystectomy. EXAM: CT ABDOMEN AND PELVIS WITH CONTRAST TECHNIQUE: Multidetector CT imaging of the abdomen and pelvis was performed using the standard protocol following bolus administration of intravenous contrast. CONTRAST:  166m ISOVUE-300 IOPAMIDOL (ISOVUE-300) INJECTION 61% COMPARISON:  ERCP 03/18/2016.  MRI 03/16/2016.  CT 03/15/2016. FINDINGS: Lower chest: Scattered emphysematous changes in the lung bases. Fibrosis or atelectasis in the lung bases. Calcification in the mitral valve annulus and coronary arteries. Hepatobiliary: Since the previous study, there is interval placement of a bile duct stent. Mild residual bile duct dilatation is less prominent than on prior study. Surgical absence of the gallbladder. No focal liver lesions. Pancreas: Diffuse pancreatic ductal dilatation with pancreatic parenchymal atrophy. Mass lesion demonstrated in the head of the pancreas on previous MRI is not well visualized at CT. No inflammatory stranding. Spleen: Focal lesion in the spleen measuring about 6 mm diameter is unchanged since previous study. This probably represents a small cyst. Additional subcentimeter lesion in the upper spleen is also unchanged. Adrenals/Urinary Tract: Adrenal glands are unremarkable. Kidneys are normal, without renal calculi, focal lesion, or hydronephrosis. Bladder is unremarkable. Stomach/Bowel: Stomach and small bowel are mostly decompressed. No small bowel distention or inflammatory infiltration. Colon is diffusely stool-filled without abnormal distention or inflammation. Scattered colonic diverticula. Appendix is not identified. Vascular/Lymphatic: Aortic atherosclerosis. No enlarged abdominal or pelvic lymph nodes. Reproductive:  Uterus and bilateral adnexa are unremarkable. Other: Small left inguinal hernia containing fat. No free air or free fluid in the abdomen. Musculoskeletal: Degenerative changes in the spine. No destructive bone lesions. IMPRESSION: Interval placement of a bile duct stent with some decompression of the bile ducts. Mild residual biliary dilatation. Pancreatic ductal dilatation and atrophy. Known pancreatic mass lesion is not well depicted at CT. No evidence of bowel obstruction or inflammation. Electronically Signed   By: WLucienne CapersM.D.   On: 05/05/2016 04:37   [facility]  Result Date: 05/05/2016 CLINICAL DATA:  Confusion, hyperglycemia, and headache tonight EXAM: PORTABLE CHEST 1 VIEW COMPARISON:  03/28/2016 FINDINGS: Shallow inspiration. The heart size and mediastinal contours are within normal limits. Both lungs are clear. The visualized skeletal structures are unremarkable. IMPRESSION: No active disease. Electronically Signed   By: Lucienne Capers M.D.   On: 05/05/2016 00:04     IMPRESSION:   *  Pancreatic cancer with obstructive jaundice.  LFTs improved post ERCP, stent placement 02/2016. Now with recurrent rise in LFTs.  WBCs elevated.  ? Stent obstruction/clog with cholangitis.  Day 1 Zosyn and single dose Rocephin.     *  AMS, moderate encephalopathy.  Head CT unrevealing, no physical deficits.  Suspect illness and narcotics are the causes.    *  IDDM type 2 with ongoing poorly controlled blood sugars  *  OSA.  Oxygen sats in 90s, no labored breathing and reassuring CXR.    *  Hypotension.      PLAN:     *  Arrange for ERCP, stent exchange tomorrow.  Continue Zosyn and prn pain meds  *  Going to increase IVF from 100 ml/hour to 125/hour.  CBC, CMET in AM.    *  Has appt with surgeon for 3/19 but do not see and appt pending with oncology on Epic.     Azucena Freed  05/05/2016, 9:35 AM Pager: [phone]    [facility] Attending   I have taken an interval  history, reviewed the chart and examined the patient. I agree with the Advanced Practitioner's note, impression and recommendations.   Biliary sepsis/cholangitis from stent occlusion is suspected - she is better w/ Abx Stent replacement tomorrow  Gatha Mayer, MD, [facility] [phone] (pager) [phone] after 5 PM, weekends and holidays  05/05/2016 5:03 PM

## 2016-05-06 NOTE — Op Note (Signed)
[facility] Patient Name: Melissa Myers Procedure Date : 05/06/2016 MRN: [id number] Attending MD: Gatha Mayer , MD Date of Birth: [birth year] CSN: [id number] Age: 71 Admit Type: Inpatient Procedure:                ERCP Indications:              Suspected ascending cholangitis, For therapy of                            ascending cholangitis, Stent change Providers:                Gatha Mayer, MD, Dortha Schwalbe RN, RN,                            Vista Lawman, RN, Cherylynn Ridges, Technician Referring MD:              Medicines:                General Anesthesia Complications:            No immediate complications. Estimated Blood Loss:     Estimated blood loss: none. Procedure:                Pre-Anesthesia Assessment:                           - Prior to the procedure, a History and Physical                            was performed, and patient medications and                            allergies were reviewed. The patient's tolerance of                            previous anesthesia was also reviewed. The risks                            and benefits of the procedure and the sedation                            options and risks were discussed with the patient.                            All questions were answered, and informed consent                            was obtained. Prior Anticoagulants: The patient has                            taken no previous anticoagulant or antiplatelet                            agents. ASA Grade Assessment: III - A patient with  severe systemic disease. After reviewing the risks                            and benefits, the patient was deemed in                            satisfactory condition to undergo the procedure.                           After obtaining informed consent, the scope was                            passed under direct vision. Throughout the                            procedure,  the patient's blood pressure, pulse, and                            oxygen saturations were monitored continuously. The                            [id number]) scope was introduced through                            the mouth, and used to inject contrast into and                            used to inject contrast into the bile duct. The                            ERCP was technically difficult and complex due to                            challenging cannulation because of abnormal                            anatomy. The patient tolerated the procedure well. Scope In: Scope Out: Findings:      A biliary stent was visible on the scout film. Esophagus not well seen       stomach NL - stent in duodenum with small diverticulum above papilla s/p       sphincterotomy. Stent removed no problem w/ snare - it was clogged.       Purulent material and slight bile out. Able to cannulate and get wire       across distal stricture but could not get catheter across. Contrast       limited flow even with balloon occlusion. Tried to dilate the tract with       4 mm x 2 cm balloon after SEMS catheter would not go across - then used       tapered Fusion step dilator 6 fr and that would not work. Position was       not well under papilla so bowing of catheters would occur and could not       advance through the stricture. Procedure terminated. Impression:               -  Biliary stent removal. 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 The stricture was malignant appearing. Could                            not cross it today except with wire so no stent                            replacement Recommendation:           - IR consult for percutaneous drainage - then will                            see.                           I will message Dr.  Barry Dienes and Burr Medico that patient in                            hose and will not make appointments next week. Procedure Code(s):        --- Professional ---                           [phone], Endoscopic retrograde                            cholangiopancreatography (ERCP); diagnostic,                            including collection of specimen(s) by brushing or                            washing, when performed (separate procedure) Diagnosis Code(s):        --- Professional ---                           K83.1, Obstruction of bile duct                           K83.0, Cholangitis                           Z46.59, Encounter for fitting and adjustment of                            other gastrointestinal appliance and device CPT copyright 2016 [employer]. All rights reserved. The codes documented in this report are preliminary and upon coder review may  be revised to meet current compliance requirements. Gatha Mayer,  MD 05/06/2016 4:19:28 PM This report has been signed electronically. Number of Addenda: 0

## 2016-05-06 NOTE — Consult Note (Signed)
Chief Complaint: Patient was seen in consultation today for jaundice  Referring Physician(s):  Dr. Silvano Rusk  Supervising Physician: Jacqulynn Cadet  Patient Status: [facility] - In-pt  History of Present Illness: Melissa Myers is a 71 y.o. female with past medical history of IDDM, CHF, HTN, and recent pancreatic pass d/p biliary stent 04/28/16 presented to [facility] with abdominal pain, nausea, malaise, and jaundice.   Patient underwent ERCP today with Dr. Carlean Purl who found occluded biliary stent.  This was removed and distal CBD stricture remained despite attempts to pass through. IR consulted for biliary drain placement at the request of Dr. Carlean Purl.  Case was reviewed by Dr. Jacqulynn Cadet who approves patient for procedure tomorrow morning.   Patient will be NPO after midnight.  Blood thinner continues to be held.    Past Medical History:  Diagnosis Date  . Anemia   . Cellulitis    LOWER EXTREMITIES  . Chronic diarrhea    a/w nausea - felt related to IBS  . Deaf    left side only  . Diastolic CHF ([place])   . Disc degeneration, lumbar   . GERD (gastroesophageal reflux disease)   . Hyperlipidemia    hx rhabdo on statins  . Hypertension   . Neuropathy (HCC)    feet, toes and fingers  . On home oxygen therapy    uses oxygen 2 liters min per [place] at night and prn during day  . OSA (obstructive sleep apnea)    05/2009 sleep study - refuses CPAP  . Osteoarthritis   . RLS (restless legs syndrome)   . Shortness of breath    chronic  . Stasis dermatitis   . Type II or unspecified type diabetes mellitus without mention of complication, not stated as uncontrolled    insulin dep    Past Surgical History:  Procedure Laterality Date  . CHOLECYSTECTOMY  1997  . COLONOSCOPY N/A 12/03/2012   Procedure: COLONOSCOPY;  Surgeon: Lafayette Dragon, MD;  Location: [facility] ENDOSCOPY;  Service: Endoscopy;  Laterality: N/A;  . ERCP N/A 03/18/2016   Procedure: ENDOSCOPIC RETROGRADE  CHOLANGIOPANCREATOGRAPHY (ERCP);  Surgeon: Irene Shipper, MD;  Location: [facility];  Service: Endoscopy;  Laterality: N/A;  . EUS N/A 04/28/2016   Procedure: UPPER ENDOSCOPIC ULTRASOUND (EUS) LINEAR;  Surgeon: [name], MD;  Location: [facility] ENDOSCOPY;  Service: Endoscopy;  Laterality: N/A;  . TONSILLECTOMY  1970  . TUBAL LIGATION  1980  . UMBILICAL HERNIA REPAIR  1995  . uterine polyp removal  2008    Allergies: Cymbalta [duloxetine hcl]; Statins; Sulfa antibiotics; Levemir [insulin detemir]; Morphine; and Zinc  Medications: Prior to Admission medications   Medication Sig Start Date End Date Taking? Authorizing Provider  aspirin EC 81 MG tablet Take 1 tablet (81 mg total) by mouth daily. 07/08/14  Yes Rowe Clack, MD  carvedilol (COREG) 25 MG tablet TAKE 1 TABLET BY MOUTH TWICE DAILY WITH FOOD 01/20/16  Yes Jolaine Artist, MD  Cholecalciferol (VITAMIN D3) 2000 units capsule Take 1 capsule (2,000 Units total) by mouth daily. 06/17/15  Yes Janith Lima, MD  citalopram (CELEXA) 20 MG tablet TAKE 1 TABLET (20 MG TOTAL) BY MOUTH DAILY. 01/27/16  Yes [name], MD  DULoxetine (CYMBALTA) 60 MG capsule Take 1 capsule (60 mg total) by mouth daily. 09/25/15  Yes [name], MD  ezetimibe (ZETIA) 10 MG tablet TAKE ONE TABLET BY MOUTH DAILY 02/03/16  Yes [name], MD  gabapentin (NEURONTIN)  300 MG capsule TAKE TWO CAPSULES BY MOUTH FOUR TIMES A DAY 01/19/16  Yes [name], MD  hydrALAZINE (APRESOLINE) 25 MG tablet TAKE 1 TABLET (25 MG TOTAL) BY MOUTH 3 (THREE) TIMES DAILY. 02/16/16  Yes Jolaine Artist, MD  HYDROcodone-acetaminophen (NORCO) 10-325 MG tablet Take 1 tablet by mouth every 6 (six) hours as needed. 04/08/16  Yes [name], MD  insulin aspart (NOVOLOG) 100 UNIT/ML injection Inject 0-20 Units into the skin 3 (three) times daily with meals. . CBG 70 - 120: 0 units CBG 121 - 150: 3 units CBG 151 - 200: 4 units CBG 201 - 250:  7 units CBG 251 - 300: 11 units CBG 301 - 350: 15 units CBG 351 - 400: 20 units 03/21/16  Yes Mariel Aloe, MD  insulin glargine (LANTUS) 100 UNIT/ML injection Inject 0.3 mLs (30 Units total) into the skin daily. 03/22/16  Yes Mariel Aloe, MD  LYRICA 200 MG capsule TAKE ONE CAPSULE BY MOUTH TWICE A DAY 04/07/16  Yes [name], MD  meloxicam (MOBIC) 7.5 MG tablet TAKE ONE TABLET BY MOUTH DAILY 03/11/16  Yes [name], MD  metFORMIN (GLUCOPHAGE-XR) 500 MG 24 hr tablet Take 1,000 mg by mouth daily with breakfast.    Yes Historical Provider, MD  metolazone (ZAROXOLYN) 2.5 MG tablet Take 1 tablet (2.5 mg total) by mouth 2 (two) times a week. 12/17/15  Yes Eutha Friar, PA-C  rOPINIRole (REQUIP XL) 4 MG 24 hr tablet TAKE 1 TABLET (4 MG TOTAL) BY MOUTH AT BEDTIME. 02/23/16  Yes [name], MD  spironolactone (ALDACTONE) 25 MG tablet TAKE ONE TABLET EACH DAY 04/21/16  Yes Larey Dresser, MD  torsemide (DEMADEX) 100 MG tablet Take 1 tablet (100 mg total) by mouth daily. 03/21/16  Yes Mariel Aloe, MD  vitamin B-12 (CYANOCOBALAMIN) 500 MCG tablet Take 500 mcg by mouth daily. Reported on 09/09/2015   Yes Historical Provider, MD     Family History  Problem Relation Age of Onset  . Heart disease Mother   . Heart attack Mother 21  . Heart disease Father   . Heart attack Father 61  . Heart disease      family history  . Stomach cancer Paternal Grandmother   . Lung cancer Paternal Grandfather   . CVA      several aunts  . Heart attack      several aunts and an uncle  . Colon cancer Neg Hx     Social History   Social History  . Marital status: Married    Spouse name: N/A  . Number of children: N/A  . Years of education: N/A   Social History Main Topics  . Smoking status: Never Smoker  . Smokeless tobacco: Never Used  . Alcohol use No  . Drug use: No  . Sexual activity: No   Other Topics Concern  . None   Social History Narrative   Lives with  spouse and mother. Retired Network engineer, now housewife    Review of Systems  Constitutional: Positive for fatigue. Negative for fever.  Respiratory: Negative for cough and shortness of breath.   Cardiovascular: Negative for chest pain.  Gastrointestinal: Positive for abdominal pain.  Psychiatric/Behavioral: Negative for behavioral problems and confusion.    Vital Signs: BP (!) 110/49 (BP Location: Right Arm)   Pulse 64   Temp 98.8 F (37.1 C)   Resp 18   Ht 4\' 10"  (1.473 m)  Wt 240 lb (108.9 kg)   SpO2 99%   BMI 50.16 kg/m   Physical Exam  Constitutional: She is oriented to person, place, and time. She appears well-developed.  Cardiovascular: Normal rate, regular rhythm and normal heart sounds.   Pulmonary/Chest: Effort normal and breath sounds normal. No respiratory distress.  Abdominal: Soft. There is tenderness.  Neurological: She is alert and oriented to person, place, and time.  Skin: Skin is warm and dry.  Psychiatric: She has a normal mood and affect. Her behavior is normal. Judgment and thought content normal.  Nursing note and vitals reviewed.   Mallampati Score:  MD Evaluation Airway: WNL Heart: WNL Abdomen: WNL Abdomen comments: mildly tender upper abdomen Chest/ Lungs: WNL ASA  Classification: 3 Mallampati/Airway Score: Two  Imaging: Ct Head Wo Contrast  Result Date: 05/05/2016 CLINICAL DATA:  71 year old female with headache and confusion. EXAM: CT HEAD WITHOUT CONTRAST TECHNIQUE: Contiguous axial images were obtained from the base of the skull through the vertex without intravenous contrast. COMPARISON:  Head CT dated 10/22/2014 FINDINGS: Brain: The ventricles and sulci appropriate in size for patient's age. Minimal periventricular and deep white matter chronic microvascular ischemic changes noted. There is no acute intracranial hemorrhage. No mass effect or midline shift noted. There is no extra-axial fluid collection. Vascular: No hyperdense vessel or  unexpected calcification. Skull: Normal. Negative for fracture or focal lesion. Sinuses/Orbits: No acute finding. Other: None. IMPRESSION: No acute intracranial pathology. Electronically Signed   By: Anner Crete M.D.   On: 05/05/2016 01:21   Ct Abdomen Pelvis W Contrast  Result Date: 05/05/2016 CLINICAL DATA:  Epigastric pain. Hx diabetes, GERD, lumbar disc degeneration, chronic diarrhea, uterine polyp removal, umbilical hernia, tubal ligation, and cholecystectomy. EXAM: CT ABDOMEN AND PELVIS WITH CONTRAST TECHNIQUE: Multidetector CT imaging of the abdomen and pelvis was performed using the standard protocol following bolus administration of intravenous contrast. CONTRAST:  137mL ISOVUE-300 IOPAMIDOL (ISOVUE-300) INJECTION 61% COMPARISON:  ERCP 03/18/2016.  MRI 03/16/2016.  CT 03/15/2016. FINDINGS: Lower chest: Scattered emphysematous changes in the lung bases. Fibrosis or atelectasis in the lung bases. Calcification in the mitral valve annulus and coronary arteries. Hepatobiliary: Since the previous study, there is interval placement of a bile duct stent. Mild residual bile duct dilatation is less prominent than on prior study. Surgical absence of the gallbladder. No focal liver lesions. Pancreas: Diffuse pancreatic ductal dilatation with pancreatic parenchymal atrophy. Mass lesion demonstrated in the head of the pancreas on previous MRI is not well visualized at CT. No inflammatory stranding. Spleen: Focal lesion in the spleen measuring about 6 mm diameter is unchanged since previous study. This probably represents a small cyst. Additional subcentimeter lesion in the upper spleen is also unchanged. Adrenals/Urinary Tract: Adrenal glands are unremarkable. Kidneys are normal, without renal calculi, focal lesion, or hydronephrosis. Bladder is unremarkable. Stomach/Bowel: Stomach and small bowel are mostly decompressed. No small bowel distention or inflammatory infiltration. Colon is diffusely stool-filled  without abnormal distention or inflammation. Scattered colonic diverticula. Appendix is not identified. Vascular/Lymphatic: Aortic atherosclerosis. No enlarged abdominal or pelvic lymph nodes. Reproductive: Uterus and bilateral adnexa are unremarkable. Other: Small left inguinal hernia containing fat. No free air or free fluid in the abdomen. Musculoskeletal: Degenerative changes in the spine. No destructive bone lesions. IMPRESSION: Interval placement of a bile duct stent with some decompression of the bile ducts. Mild residual biliary dilatation. Pancreatic ductal dilatation and atrophy. Known pancreatic mass lesion is not well depicted at CT. No evidence of bowel obstruction or inflammation. Electronically  Signed   By: Lucienne Capers M.D.   On: 05/05/2016 04:37   Dg Chest Port 1 View  Result Date: 05/05/2016 CLINICAL DATA:  Confusion, hyperglycemia, and headache tonight EXAM: PORTABLE CHEST 1 VIEW COMPARISON:  03/28/2016 FINDINGS: Shallow inspiration. The heart size and mediastinal contours are within normal limits. Both lungs are clear. The visualized skeletal structures are unremarkable. IMPRESSION: No active disease. Electronically Signed   By: Lucienne Capers M.D.   On: 05/05/2016 00:04   Dg Ercp  Result Date: 05/06/2016 CLINICAL DATA:  Adenocarcinoma of the pancreas, cholangitis, jaundice and previous endoscopic biliary stent placement. EXAM: ERCP TECHNIQUE: Multiple spot images obtained with the fluoroscopic device and submitted for interpretation post-procedure. COMPARISON:  CT of the abdomen on 05/05/2016 as well as multiple additional prior imaging studies. FINDINGS: Imaging during ERCP demonstrates cannulation of the common bile duct and removal of an indwelling biliary stent. A guidewire was able to be passed through a biliary stricture. The stent was not able to be replaced. IMPRESSION: Imaging demonstrating removal of endoscopic common bile duct stent. The stent was not able to be  replaced. These images were submitted for radiologic interpretation only. Please see the procedural report for the amount of contrast and the fluoroscopy time utilized. Electronically Signed   By: Aletta Edouard M.D.   On: 05/06/2016 14:47    Labs:  CBC:  Recent Labs  03/27/16 1247 03/28/16 2110 05/05/16 0023 05/06/16 0638  WBC 5.6 5.7 11.0* 7.9  HGB 9.4* 8.4* 11.4* 10.0*  HCT 29.4* 25.7* 33.9* 31.4*  PLT 350 328 347 327    COAGS:  Recent Labs  03/15/16 1116 05/05/16 0023  INR 0.94 0.97  APTT 30 32    BMP:  Recent Labs  03/28/16 2110 04/28/16 1015 05/05/16 0023 05/06/16 0638  NA 136 131* 131* 138  K 3.2* 3.8 4.3 2.9*  CL 96* 90* 86* 91*  CO2 28 29 30  34*  GLUCOSE 325* 449* 416* 212*  BUN 19 34* 24* 13  CALCIUM 8.5* 9.3 8.5* 8.0*  CREATININE 1.09* 1.21* 1.38* 1.00  GFRNONAA 50* 44* 38* 56*  GFRAA 58* 51* 44* >60    LIVER FUNCTION TESTS:  Recent Labs  03/28/16 2110 04/28/16 1015 05/05/16 0023 05/06/16 0638  BILITOT 2.5* 1.3* 3.7* 6.7*  AST 55* 27 127* 130*  ALT 32 23 69* 59*  ALKPHOS 150* 148* 752* 712*  PROT 6.5 7.0 6.8 5.6*  ALBUMIN 2.8* 3.3* 2.4* 1.9*    TUMOR MARKERS:  Recent Labs  03/17/16 1147  CA199 107*    Assessment and Plan: Patient with history of pancreatic mass and biliary stent placement 02/2016 now presents with jaundice and cholangitis.  Patient found to have occluded biliary stent and bile duct stricture.   IR consulted for biliary drain placement at the request of Dr. Carlean Purl. Case approved by Dr. Jacqulynn Cadet.  Anticipate procedure tomorrow AM.  Patient is to be NPO after midnight.  Continue to hold lovenox.    Thank you for this interesting consult.  I greatly enjoyed meeting Melissa Myers and look forward to participating in their care.  A copy of this report was sent to the requesting provider on this date.  Electronically Signed: Docia Barrier 05/06/2016, 5:34 PM   I spent a total of 40  Minutes    in face to face in clinical consultation, greater than 50% of which was counseling/coordinating care for cholangitis.

## 2016-05-06 NOTE — Progress Notes (Signed)
Patient in recovery, moaning very infrequently. Doesn't appear to be in any pain or distress. When asked about her mental status, her family said that "she has been out of it the past two days and it all started with this massive headache." I'm wondering if her floor nurse was aware of this and if anyone investigated the idea of stroke. I will call report shortly and notify RN of this information.

## 2016-05-06 NOTE — Progress Notes (Signed)
Patient ID: Melissa Myers, female   DOB: 06-24-1945, 71 y.o.   MRN: [id number]    PROGRESS NOTE    Melissa Myers  GGE:[id number] DOB: December 23, 1945 DOA: 05/04/2016  PCP: [name], MD   Brief Narrative:  71 yo female with DM2 and complications of neuropathy, OSA, declines CPAP but home oxygen prn, s/p cholecystectomy 1997, tubular adenoma on 11/2012 Colonoscopy.  2009 ERCP with 12 mm sphincterotomy due to jaundice, with ? filling defect but no stones on bile duct sweep. Recent admission 1/23 until 1/29 for obstructive jaundice, 03/18/16 ERCP with biliary stent placement across malignant appearing distal CBD stricture.  CA 19-9 107, EUS 04/28/16: stent in good position.  2.9 cm mass in pancreatic uncinate obstructing main PD and bile duct.   Assessment & Plan:  Pancreatic cancer with obstructive jaundice, cholangitis, transaminitis, hyperbilirubinemia  - LFTs improved post ERCP, stent placement 02/2016 but now trending up - plan for ERCP today - appreciate GI team following  - continue Zosyn day #2 - I called cancer center, I have made official referral, information will be forwarded to Dr. 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 UTI - follow up on urine cultures - Zosyn should be adequate   Hypokalemia - supplement and repeat BMP in AM  Hyponatremia - pre renal  - resolved with IVF  Morbid obesity  - Body mass index is 50.16 kg/m.  DVT prophylaxis:  Lovenox SQ Code Status: Full    Family Communication: Patient at bedside  Disposition Plan: To be determined   Consultants:   GI  Procedures:   ERCP 3/16 -->  Antimicrobials:   Zosyn 3/15 -->  Subjective: Still with back pain and left sided abd pain.   Objective: Vitals:   05/05/16 0647 05/05/16 2123 05/06/16 0455 05/06/16 0844  BP: (!) 113/48 (!) 121/51 (!) 115/47   Pulse: 62 65 69 72  Resp: 20 18 18    Temp: 98.1 F (36.7 C) 99.9 F (37.7 C) 99.6 F (37.6 C) 100.2 F (37.9 C)  TempSrc: Oral Oral Oral Oral  SpO2: 99% 99% 93% 96%  Weight:      Height:        Intake/Output Summary (Last 24 hours) at 05/06/16 1000 Last data filed at 05/05/16 1520  Gross per 24 hour  Intake           571.67 ml  Output                0 ml  Net           571.67 ml   Filed Weights   05/04/16 2337  Weight: 108.9 kg (240 lb)    Examination:  General exam: Appears somnolent but can awake, NAD Respiratory system: Respiratory effort normal. Diminished breath sounds at bases  Cardiovascular system: S1 & S2 heard, RRR. No JVD, murmurs, rubs,  gallops or clicks. No pedal edema. Gastrointestinal system: Abdomen is nondistended, tender in epigastric area. Normal bowel sounds heard.  Data Reviewed: I have personally reviewed following labs and imaging studies  CBC:  Recent Labs Lab 05/05/16 0023 05/06/16 0638  WBC 11.0* 7.9  NEUTROABS 8.1*  --   HGB 11.4* 10.0*  HCT 33.9* 31.4*  MCV 86.3 86.7  PLT 347 989   Basic Metabolic Panel:  Recent Labs Lab 05/05/16 0023 05/06/16 0638  NA 131* 138  K 4.3 2.9*  CL 86* 91*  CO2 30 34*  GLUCOSE 416* 212*  BUN 24* 13  CREATININE 1.38* 1.00  CALCIUM 8.5* 8.0*   Liver Function Tests:  Recent Labs Lab 05/05/16 0023 05/06/16 0638  AST 127* 130*  ALT 69* 59*  ALKPHOS 752* 712*  BILITOT 3.7* 6.7*  PROT 6.8 5.6*  ALBUMIN 2.4* 1.9*    Recent Labs Lab 05/05/16 0023  LIPASE 27    Recent Labs Lab 05/05/16 0023  AMMONIA 51*   Coagulation  Profile:  Recent Labs Lab 05/05/16 0023  INR 0.97   Cardiac Enzymes:  Recent Labs Lab 05/05/16 0023  TROPONINI <0.03   BNP (last 3 results)  Recent Labs  11/04/15 0956  PROBNP 20.0   CBG:  Recent Labs Lab 05/05/16 1706 05/05/16 2030 05/06/16 0000 05/06/16 0453 05/06/16 0914  GLUCAP 345* 292* 275* 215* 224*   Urine analysis:    Component Value Date/Time   COLORURINE YELLOW 05/05/2016 0219   APPEARANCEUR CLEAR 05/05/2016 0219   LABSPEC 1.007 05/05/2016 0219   PHURINE 5.0 05/05/2016 0219   GLUCOSEU >=500 (A) 05/05/2016 0219   GLUCOSEU NEGATIVE 11/04/2015 0956   HGBUR NEGATIVE 05/05/2016 0219   BILIRUBINUR NEGATIVE 05/05/2016 0219   BILIRUBINUR neg 08/17/2015 1547   KETONESUR NEGATIVE 05/05/2016 0219   PROTEINUR NEGATIVE 05/05/2016 0219   UROBILINOGEN 0.2 11/04/2015 0956   NITRITE POSITIVE (A) 05/05/2016 0219   LEUKOCYTESUR TRACE (A) 05/05/2016 0219   Recent Results (from the past 240 hour(s))  Urine culture     Status: Abnormal (Preliminary result)   Collection Time: 05/05/16  2:19 AM  Result Value Ref Range Status   Specimen Description URINE, CATHETERIZED  Final   Special Requests NONE  Final   Culture >=100,000 COLONIES/mL GRAM NEGATIVE RODS (A)  Final   Report Status PENDING  Incomplete  Culture, blood (Routine X 2) w Reflex to ID Panel     Status: None (Preliminary result)   Collection Time: 05/05/16  3:34 AM  Result Value Ref Range Status   Specimen Description BLOOD RIGHT ARM  Final   Special Requests AEROBIC BOTTLE ONLY 10ML ROCEPHIN  Final   Culture PENDING  Incomplete   Report Status PENDING  Incomplete  Culture, blood (Routine X 2) w Reflex to ID Panel     Status: None (Preliminary result)   Collection Time: 05/05/16  3:42 AM  Result Value Ref Range Status   Specimen Description BLOOD RIGHT HAND  Final   Special Requests IN PEDIATRIC BOTTLE 4ML ROCEPHIN  Final   Culture PENDING  Incomplete   Report Status PENDING  Incomplete     Radiology Studies: Ct Head Wo Contrast  Result Date: 05/05/2016 CLINICAL DATA:  71 year old female with headache and confusion. EXAM: CT HEAD WITHOUT CONTRAST TECHNIQUE: Contiguous axial images were obtained from the base of the skull through the vertex without intravenous contrast. COMPARISON:  Head CT dated 10/22/2014 FINDINGS: Brain: The ventricles and sulci appropriate in size for patient's age. Minimal periventricular and  deep white matter chronic microvascular ischemic changes noted. There is no acute intracranial hemorrhage. No mass effect or midline shift noted. There is no extra-axial fluid collection. Vascular: No hyperdense vessel or unexpected calcification. Skull: Normal. Negative for fracture or focal lesion. Sinuses/Orbits: No acute finding. Other: None. IMPRESSION: No acute intracranial pathology. Electronically Signed   By: Anner Crete M.D.   On: 05/05/2016 01:21   Ct Abdomen Pelvis W Contrast  Result Date: 05/05/2016 CLINICAL DATA:  Epigastric pain. Hx diabetes, GERD, lumbar disc degeneration, chronic diarrhea, uterine polyp removal, umbilical hernia, tubal ligation, and cholecystectomy. EXAM: CT ABDOMEN AND PELVIS WITH CONTRAST TECHNIQUE: Multidetector CT imaging of the abdomen and pelvis was performed using the standard protocol following bolus administration of intravenous contrast. CONTRAST:  169mL ISOVUE-300 IOPAMIDOL (ISOVUE-300) INJECTION 61% COMPARISON:  ERCP 03/18/2016.  MRI 03/16/2016.  CT 03/15/2016. FINDINGS: Lower chest: Scattered emphysematous changes in the lung bases. Fibrosis or atelectasis in the lung bases. Calcification in the mitral valve annulus and coronary arteries. Hepatobiliary: Since the previous study, there is interval placement of a bile duct stent. Mild residual bile duct dilatation is less prominent than on prior study. Surgical absence of the gallbladder. No focal liver lesions. Pancreas: Diffuse pancreatic ductal dilatation with pancreatic parenchymal  atrophy. Mass lesion demonstrated in the head of the pancreas on previous MRI is not well visualized at CT. No inflammatory stranding. Spleen: Focal lesion in the spleen measuring about 6 mm diameter is unchanged since previous study. This probably represents a small cyst. Additional subcentimeter lesion in the upper spleen is also unchanged. Adrenals/Urinary Tract: Adrenal glands are unremarkable. Kidneys are normal, without renal calculi, focal lesion, or hydronephrosis. Bladder is unremarkable. Stomach/Bowel: Stomach and small bowel are mostly decompressed. No small bowel distention or inflammatory infiltration. Colon is diffusely stool-filled without abnormal distention or inflammation. Scattered colonic diverticula. Appendix is not identified. Vascular/Lymphatic: Aortic atherosclerosis. No enlarged abdominal or pelvic lymph nodes. Reproductive: Uterus and bilateral adnexa are unremarkable. Other: Small left inguinal hernia containing fat. No free air or free fluid in the abdomen. Musculoskeletal: Degenerative changes in the spine. No destructive bone lesions. IMPRESSION: Interval placement of a bile duct stent with some decompression of the bile ducts. Mild residual biliary dilatation. Pancreatic ductal dilatation and atrophy. Known pancreatic mass lesion is not well depicted at CT. No evidence of bowel obstruction or inflammation. Electronically Signed   By: Lucienne Capers M.D.   On: 05/05/2016 04:37   Dg Chest Port 1 View  Result Date: 05/05/2016 CLINICAL DATA:  Confusion, hyperglycemia, and headache tonight EXAM: PORTABLE CHEST 1 VIEW COMPARISON:  03/28/2016 FINDINGS: Shallow inspiration. The heart size and mediastinal contours are within normal limits. Both lungs are clear. The visualized skeletal structures are unremarkable. IMPRESSION: No active disease. Electronically Signed   By: Lucienne Capers M.D.   On: 05/05/2016 00:04      Scheduled Meds: . aspirin EC  81 mg Oral Daily  . carvedilol   25 mg Oral BID WC  . citalopram  20 mg Oral Daily  . enoxaparin (LOVENOX) injection  40 mg Subcutaneous Q24H  . ezetimibe  10 mg Oral Daily  . gabapentin  600 mg Oral QID  . insulin aspart  0-9 Units Subcutaneous Q4H  . insulin glargine  8 Units Subcutaneous BID  . piperacillin-tazobactam (ZOSYN)  IV  3.375 g Intravenous Q8H  . potassium chloride (KCL MULTIRUN) 30 mEq in 265 mL IVPB  30 mEq Intravenous Q4H  . pregabalin  200 mg Oral BID  .  rOPINIRole  2 mg Oral BID   Continuous Infusions: . sodium chloride 75 mL/hr (05/05/16 1718)     LOS: 1 day   Time spent: 20 minutes   Faye Ramsay, MD [facility] Pager [phone]  If 7PM-7AM, please contact night-coverage [email or web address] Password [email or web address] 05/06/2016, 10:00 AM

## 2016-05-06 NOTE — Brief Op Note (Signed)
05/04/2016 - 05/06/2016  2:29 PM  PATIENT:  Melissa Myers  71 y.o. female  PRE-OPERATIVE DIAGNOSIS:  jaundice, cholangitis, pancreatic cancer, biliary stent placed 02/2016  POST-OPERATIVE DIAGNOSIS:  Bile duct stricture and cholangitis PROCEDURE:  Procedure(s): ENDOSCOPIC RETROGRADE CHOLANGIOPANCREATOGRAPHY (ERCP) WITH PROPOFOL (N/A) BILIARY STENT PLACEMENT (N/A)  SURGEON:  Surgeon(s) and Role:    * Gatha Mayer, MD - Primary   ANESTHESIA:   general  EBL:  Total I/O In: 1000 [I.V.:1000] Out: -     Findings - existing plastic stent was clogged - and removed w/ snare. Prior biliary sphincterotomy seen. Able to cannulate and pass through distal bile duct stricture with wire and small amounts of contrast passed but could not fill entire biliary tree.  Attempted occluison cholangiogram, attempted catheter and balloon dilations but nothing would allow passage of catheters through distal CBD stricture.  Procedure terminated.  Will consult IR for drainage.  Gatha Mayer, MD, [facility] [phone] (pager) [phone] after 5 PM, weekends and holidays  05/06/2016 2:32 PM

## 2016-05-06 NOTE — Progress Notes (Signed)
   I did notice that NH3 elevated - does not have liver disease but reasonable to add low-dose lactulose if treatment of cholangitis does not help w/ somnolence. Gatha Mayer, MD, [facility] [phone] (pager) [phone] after 5 PM, weekends and holidays  05/06/2016 4:56 PM

## 2016-05-06 NOTE — Transfer of Care (Signed)
Immediate Anesthesia Transfer of Care Note  Patient: Melissa Myers  Procedure(s) Performed: Procedure(s): ENDOSCOPIC RETROGRADE CHOLANGIOPANCREATOGRAPHY (ERCP) WITH PROPOFOL (N/A) BILIARY STENT PLACEMENT (N/A)  Patient Location: Endoscopy Unit  Anesthesia Type:General  Level of Consciousness: awake and patient cooperative  Airway & Oxygen Therapy: Patient Spontanous Breathing and Patient connected to nasal cannula oxygen  Post-op Assessment: Report given to RN, Post -op Vital signs reviewed and stable and Patient moving all extremities X 4  Post vital signs: Reviewed and stable  Last Vitals:  Vitals:   05/06/16 1107 05/06/16 1150  BP: (!) 130/48 (!) 127/47  Pulse:  76  Resp:  (!) 76  Temp:  (!) 38.7 C    Last Pain:  Vitals:   05/06/16 1150  TempSrc: Oral  PainSc:          Complications: No apparent anesthesia complications

## 2016-05-07 ENCOUNTER — Inpatient Hospital Stay (HOSPITAL_COMMUNITY): Payer: PPO

## 2016-05-07 ENCOUNTER — Encounter (HOSPITAL_COMMUNITY): Payer: Self-pay | Admitting: Interventional Radiology

## 2016-05-07 ENCOUNTER — Other Ambulatory Visit: Payer: Self-pay | Admitting: Interventional Radiology

## 2016-05-07 DIAGNOSIS — K831 Obstruction of bile duct: Principal | ICD-10-CM

## 2016-05-07 DIAGNOSIS — C801 Malignant (primary) neoplasm, unspecified: Secondary | ICD-10-CM

## 2016-05-07 HISTORY — PX: IR GENERIC HISTORICAL: IMG1180011

## 2016-05-07 LAB — COMPREHENSIVE METABOLIC PANEL
ALT: 54 U/L (ref 14–54)
AST: 113 U/L — AB (ref 15–41)
Albumin: 1.6 g/dL — ABNORMAL LOW (ref 3.5–5.0)
Alkaline Phosphatase: 677 U/L — ABNORMAL HIGH (ref 38–126)
Anion gap: 7 (ref 5–15)
BILIRUBIN TOTAL: 8.6 mg/dL — AB (ref 0.3–1.2)
BUN: 15 mg/dL (ref 6–20)
CHLORIDE: 97 mmol/L — AB (ref 101–111)
CO2: 34 mmol/L — ABNORMAL HIGH (ref 22–32)
Calcium: 7.7 mg/dL — ABNORMAL LOW (ref 8.9–10.3)
Creatinine, Ser: 0.97 mg/dL (ref 0.44–1.00)
GFR calc Af Amer: >60 mL/min (ref >60)
GFR, EST NON AFRICAN AMERICAN: 58 mL/min — AB (ref >60)
Glucose, Bld: 201 mg/dL — ABNORMAL HIGH (ref 65–99)
POTASSIUM: 2.9 mmol/L — AB (ref 3.5–5.1)
Sodium: 138 mmol/L (ref 135–145)
TOTAL PROTEIN: 5.3 g/dL — AB (ref 6.5–8.1)

## 2016-05-07 LAB — CBC
HEMATOCRIT: 28.2 % — AB (ref 36.0–46.0)
HEMOGLOBIN: 8.8 g/dL — AB (ref 12.0–15.0)
MCH: 28 pg (ref 26.0–34.0)
MCHC: 31.2 g/dL (ref 30.0–36.0)
MCV: 89.8 fL (ref 78.0–100.0)
Platelets: 275 10*3/uL (ref 150–400)
RBC: 3.14 MIL/uL — ABNORMAL LOW (ref 3.87–5.11)
RDW: 16.7 % — AB (ref 11.5–15.5)
WBC: 5.9 10*3/uL (ref 4.0–10.5)

## 2016-05-07 LAB — GLUCOSE, CAPILLARY
GLUCOSE-CAPILLARY: 247 mg/dL — AB (ref 65–99)
GLUCOSE-CAPILLARY: 199 mg/dL — AB (ref 65–99)
GLUCOSE-CAPILLARY: 264 mg/dL — AB (ref 65–99)
Glucose-Capillary: 177 mg/dL — ABNORMAL HIGH (ref 65–99)
Glucose-Capillary: 218 mg/dL — ABNORMAL HIGH (ref 65–99)
Glucose-Capillary: 326 mg/dL — ABNORMAL HIGH (ref 65–99)

## 2016-05-07 MED ORDER — SODIUM CHLORIDE 0.9 % IV SOLN
30.0000 meq | INTRAVENOUS | Status: AC
  Administered 2016-05-07 (×2): 30 meq via INTRAVENOUS
  Filled 2016-05-07 (×3): qty 15

## 2016-05-07 MED ORDER — LIDOCAINE HCL 1 % IJ SOLN
INTRAMUSCULAR | Status: AC | PRN
  Administered 2016-05-07: 10 mL

## 2016-05-07 MED ORDER — LIDOCAINE HCL (PF) 1 % IJ SOLN
INTRAMUSCULAR | Status: AC
  Filled 2016-05-07: qty 30

## 2016-05-07 MED ORDER — FENTANYL CITRATE (PF) 100 MCG/2ML IJ SOLN
INTRAMUSCULAR | Status: AC | PRN
  Administered 2016-05-07: 50 ug via INTRAVENOUS
  Administered 2016-05-07 (×2): 25 ug via INTRAVENOUS

## 2016-05-07 MED ORDER — MIDAZOLAM HCL 2 MG/2ML IJ SOLN
INTRAMUSCULAR | Status: AC | PRN
  Administered 2016-05-07 (×3): 0.5 mg via INTRAVENOUS
  Administered 2016-05-07: 1 mg via INTRAVENOUS

## 2016-05-07 MED ORDER — MIDAZOLAM HCL 2 MG/2ML IJ SOLN
INTRAMUSCULAR | Status: AC
  Filled 2016-05-07: qty 4

## 2016-05-07 MED ORDER — FENTANYL CITRATE (PF) 100 MCG/2ML IJ SOLN
INTRAMUSCULAR | Status: AC
  Filled 2016-05-07: qty 4

## 2016-05-07 MED ORDER — IOPAMIDOL (ISOVUE-300) INJECTION 61%
INTRAVENOUS | Status: AC
  Administered 2016-05-07: 25 mL
  Filled 2016-05-07: qty 50

## 2016-05-07 NOTE — Progress Notes (Signed)
Patient ID: Melissa Myers, female   DOB: Aug 03, 1945, 71 y.o.   MRN: [id number]    PROGRESS NOTE    Melissa Myers  TMH:[id number] DOB: 1945/10/23 DOA: 05/04/2016  PCP: [name], MD   Brief Narrative:  71 yo female with DM2 and complications of neuropathy, OSA, declines CPAP but home oxygen prn, s/p cholecystectomy 1997, tubular adenoma on 11/2012 Colonoscopy.  2009 ERCP with 12 mm sphincterotomy due to jaundice, with ? filling defect but no stones on bile duct sweep. Recent admission 1/23 until 1/29 for obstructive jaundice, 03/18/16 ERCP with biliary stent placement across malignant appearing distal CBD stricture.  CA 19-9 107, EUS 04/28/16: stent in good position.  2.9 cm mass in pancreatic uncinate obstructing main PD and bile duct.  Assessment & Plan:  Pancreatic cancer with obstructive jaundice, cholangitis, transaminitis, hyperbilirubinemia  - LFTs improved post ERCP, stent placement 02/2016 but now trending up - ERCP on 3/16, notable for existing plastic stent clogged - and removed w/ snare.Prior biliary sphincterotomy seen. Able to cannulate and pass through distal bile duct stricture with wire and small amounts of contrast passed but could not fill entire biliary tree. Attempted occluison cholangiogram, attempted catheter and balloon dilations but nothing would allow passage of catheters through distal CBD stricture - IR consulted for drainage  - Percutaneous transhepatic cholangiogram and placement of internal/external biliary drain.   Aspirated purulent bile sent for culture --> done 3/17 by IR team - continue Zosyn day #3 - appreciate GI and IR team following  - I called cancer center, I have made official referral, information will be forwarded to Dr. 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  DVT prophylaxis:  Lovenox SQ Code Status: Full  Family Communication: Patient and son at bedside  Disposition Plan: To be determined   Consultants:   GI  Procedures:   ERCP 3/16 -->  Antimicrobials:   Zosyn 3/15 -->  Subjective: Still with back pain and left sided abd pain.   Objective: Vitals:   05/07/16 1010 05/07/16 1015 05/07/16 1023 05/07/16 1028  BP: 123/66 116/61 (!) 111/55 113/60  Pulse: (!) 52 (!) 52 (!) 51 (!) 51  Resp: 16 15 15 16   Temp:      TempSrc:      SpO2: 94% 94% 96% 95%  Weight:  Height:        Intake/Output Summary (Last 24 hours) at 05/07/16 1410 Last data filed at 05/07/16 0641  Gross per 24 hour  Intake          1986.75 ml  Output              625 ml  Net          1361.75 ml   Filed Weights   05/04/16 2337  Weight: 108.9 kg (240 lb)    Examination:  General exam: Appears somnolent but can awake, NAD, jaundiced  Respiratory system: Respiratory effort normal. Diminished breath sounds at bases  Cardiovascular system: S1 & S2 heard, RRR. No JVD, murmurs, rubs, gallops or clicks. Trace bilateral pitting edema Gastrointestinal system: Abdomen is nondistended, tender in epigastric area. Normal bowel sounds heard.  Data Reviewed: I have personally reviewed following  labs and imaging studies  CBC:  Recent Labs Lab 05/05/16 0023 05/06/16 0638 05/07/16 0808  WBC 11.0* 7.9 5.9  NEUTROABS 8.1*  --   --   HGB 11.4* 10.0* 8.8*  HCT 33.9* 31.4* 28.2*  MCV 86.3 86.7 89.8  PLT 347 327 540   Basic Metabolic Panel:  Recent Labs Lab 05/05/16 0023 05/06/16 0638 05/07/16 0808  NA 131* 138 138  K 4.3 2.9* 2.9*  CL 86* 91* 97*  CO2 30 34* 34*  GLUCOSE 416* 212* 201*  BUN 24* 13 15  CREATININE 1.38* 1.00 0.97  CALCIUM 8.5* 8.0* 7.7*   Liver Function Tests:  Recent Labs Lab 05/05/16 0023 05/06/16 0638 05/07/16 0808  AST 127* 130* 113*  ALT 69* 59* 54  ALKPHOS 752* 712* 677*  BILITOT 3.7* 6.7* 8.6*  PROT 6.8 5.6* 5.3*  ALBUMIN 2.4* 1.9* 1.6*    Recent Labs Lab 05/05/16 0023  LIPASE 27    Recent Labs Lab 05/05/16 0023  AMMONIA 51*   Coagulation Profile:  Recent Labs Lab 05/05/16 0023  INR 0.97   Cardiac Enzymes:  Recent Labs Lab 05/05/16 0023  TROPONINI <0.03   BNP (last 3 results)  Recent Labs  11/04/15 0956  PROBNP 20.0   CBG:  Recent Labs Lab 05/06/16 2044 05/06/16 2357 05/07/16 0354 05/07/16 0743 05/07/16 1200  GLUCAP 328* 326* 247* 199* 177*   Urine analysis:    Component Value Date/Time   COLORURINE YELLOW 05/05/2016 0219   APPEARANCEUR CLEAR 05/05/2016 0219   LABSPEC 1.007 05/05/2016 0219   PHURINE 5.0 05/05/2016 0219   GLUCOSEU >=500 (A) 05/05/2016 0219   GLUCOSEU NEGATIVE 11/04/2015 0956   HGBUR NEGATIVE 05/05/2016 0219   BILIRUBINUR NEGATIVE 05/05/2016 0219   BILIRUBINUR neg 08/17/2015 1547   KETONESUR NEGATIVE 05/05/2016 0219   PROTEINUR NEGATIVE 05/05/2016 0219   UROBILINOGEN 0.2 11/04/2015 0956   NITRITE POSITIVE (A) 05/05/2016 0219   LEUKOCYTESUR TRACE (A) 05/05/2016 0219   Recent Results (from the past 240 hour(s))  Urine culture     Status: Abnormal (Preliminary result)   Collection Time: 05/05/16  2:19 AM  Result Value Ref Range Status   Specimen Description URINE,  CATHETERIZED  Final   Special Requests NONE  Final   Culture (A)  Final    >=100,000 COLONIES/mL KLEBSIELLA PNEUMONIAE SUSCEPTIBILITIES TO FOLLOW    Report Status PENDING  Incomplete  Culture, blood (Routine X 2) w Reflex to ID Panel     Status: None (Preliminary result)   Collection Time: 05/05/16  3:34 AM  Result Value Ref Range Status   Specimen Description BLOOD RIGHT ARM  Final  Special Requests AEROBIC BOTTLE ONLY 10ML ROCEPHIN  Final   Culture NO GROWTH 2 DAYS  Final   Report Status PENDING  Incomplete  Culture, blood (Routine X 2) w Reflex to ID Panel     Status: None (Preliminary result)   Collection Time: 05/05/16  3:42 AM  Result Value Ref Range Status   Specimen Description BLOOD RIGHT HAND  Final   Special Requests IN PEDIATRIC BOTTLE 4ML ROCEPHIN  Final   Culture NO GROWTH 2 DAYS  Final   Report Status PENDING  Incomplete  Body fluid culture     Status: None (Preliminary result)   Collection Time: 05/07/16 10:37 AM  Result Value Ref Range Status   Specimen Description FLUID  Final   Special Requests BILIARY DRAIN  Final   Gram Stain   Final    ABUNDANT WBC PRESENT, PREDOMINANTLY PMN NO ORGANISMS SEEN    Culture PENDING  Incomplete   Report Status PENDING  Incomplete    Radiology Studies: Ct Chest Wo Contrast  Result Date: 05/07/2016 CLINICAL DATA:  Pancreatic mass, recent bile ducts stent placement EXAM: CT CHEST WITHOUT CONTRAST TECHNIQUE: Multidetector CT imaging of the chest was performed following the standard protocol without IV contrast. COMPARISON:  CT abdomen from 05/05/2016 FINDINGS: Cardiovascular: Aortic arch and left anterior descending coronary artery atherosclerotic calcification. Calcified mitral valve. Mild cardiomegaly. Mediastinum/Nodes: Small paratracheal lymph nodes are present better not appreciably pathologically enlarged. A right hilar node measures 1 cm in short axis on image 56/ 3, borderline prominent. Lungs/Pleura: Scattered bandlike  subsegmental atelectasis in both lungs. Centrilobular emphysema. Increased medial atelectasis in the left lower lobe compared to the prior CT abdomen. Small right and trace left pleural effusions with some fluid tracking in the major fissures. Pleural effusions appear new compared to 05/05/16. Upper Abdomen: High density material in the pancreatic duct, probably contrast, only partially included on today' s exam. Musculoskeletal: Subacute healing fracture the right seventh rib laterally. Old healed right third rib fracture. Thoracic spondylosis and mild thoracic kyphosis. IMPRESSION: 1. New small right and trace left pleural effusions. Mild cardiomegaly. 2. Borderline enlarged right hilar lymph node at 1 cm in short axis. 3. No definite findings of metastatic disease to the chest. 4. Centrilobular emphysema. 5. High density in the visualized part of the dorsal pancreatic duct, probably contrast medium. 6. Subacute healing fracture of the right seventh rib laterally. 7. Atherosclerosis, including the left anterior descending coronary artery. Electronically Signed   By: Van Clines M.D.   On: 05/07/2016 12:03   Dg Ercp  Result Date: 05/06/2016 CLINICAL DATA:  Adenocarcinoma of the pancreas, cholangitis, jaundice and previous endoscopic biliary stent placement. EXAM: ERCP TECHNIQUE: Multiple spot images obtained with the fluoroscopic device and submitted for interpretation post-procedure. COMPARISON:  CT of the abdomen on 05/05/2016 as well as multiple additional prior imaging studies. FINDINGS: Imaging during ERCP demonstrates cannulation of the common bile duct and removal of an indwelling biliary stent. A guidewire was able to be passed through a biliary stricture. The stent was not able to be replaced. IMPRESSION: Imaging demonstrating removal of endoscopic common bile duct stent. The stent was not able to be replaced. These images were submitted for radiologic interpretation only. Please see the  procedural report for the amount of contrast and the fluoroscopy time utilized. Electronically Signed   By: Aletta Edouard M.D.   On: 05/06/2016 14:47   Ir Biliary Drain Placement With Cholangiogram  Result Date: 05/07/2016 INDICATION: 71 year old female with malignant obstructed jaundice  and cholangitis. She recently had a plastic biliary drain placed earlier this month but presents with an occluded drain and inability to pass through the distal common bile duct stricture on ERCP. She requires percutaneous transhepatic cholangiogram and drain placement. EXAM: PRIOR PERCUTANEOUS TRANSHEPATIC CHOLANGIOGRAM AND PLACEMENT OF AN INTERNAL/ EXTERNAL BILIARY DRAINAGE CATHETER MEDICATIONS: Patient is currently receiving 3.375 g Zosyn IV. No additional antibiotic given ANESTHESIA/SEDATION: Moderate (conscious) sedation was employed during this procedure. A total of Versed 2.5 mg and Fentanyl 100 mcg was administered intravenously. Moderate Sedation Time: 20 minutes. The patient's level of consciousness and vital signs were monitored continuously by radiology nursing throughout the procedure under my direct supervision. FLUOROSCOPY TIME:  Fluoroscopy Time: 3 minutes 18 seconds (62 mGy). COMPLICATIONS: None immediate. PROCEDURE: Informed written consent was obtained from the patient after a thorough discussion of the procedural risks, benefits and alternatives. All questions were addressed. Maximal Sterile Barrier Technique was utilized including caps, mask, sterile gowns, sterile gloves, sterile drape, hand hygiene and skin antiseptic. A timeout was performed prior to the initiation of the procedure. The right upper quadrant was interrogated with ultrasound. Interval development of progressive biliary ductal dilatation. Dilated bile ducts can be identified sonographically. A suitable posterior division all duct in the right liver was identified. The overlying skin was anesthetized with 1% lidocaine. Under real-time  sonographic guidance, the bile duct was punctured with a 21 gauge Accustick needle in a single pass. There is immediate return of turbid bile through the needle hub. A gentle hand injection of contrast material was performed in a limited cholangiogram was obtained. There is extensive intra hepatic biliary ductal dilatation. The 0.018 wire was advanced into the biliary system centrally. The Accustick needle was then exchanged for the Accustick sheath which was advanced into the common hepatic duct. Additional contrast injection was performed. There is a high-grade lung segment stenosis of the common bile duct. A small wisp of contrast material passes through the stricture and into the duodenum. An Amplatz wire was successfully advanced through the stricture and into the duodenum. The skin tract was then dilated to 10 [place] and a Cook 10 French internal/external biliary drainage catheter was advanced over the wire and positioned with the locking loop in the duodenum. The tube was connected to bag drainage and secured to the skin with 0 Prolene suture. A sterile bandage was applied. The patient tolerated the procedure well. IMPRESSION: 1. 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 2. Successful placement of a 10 French internal/external biliary drainage catheter. Aspirated bile is frankly purulent. A sample was sent for culture. PLAN: 1. Maintain biliary drain to bag drainage until symptoms of cholangitis have resolved and bilirubin has nearly normalized. 2. It would be preferable to cap the biliary drain prior to discharge if the above can be accomplished. 3. Follow electrolytes and replete as needed. 4. Return to interventional radiology in 2-4 weeks for biliary stent placement. Signed, Criselda Peaches, MD [facility]  [facility] Electronically Signed   By: Jacqulynn Cadet M.D.   On: 05/07/2016 11:22      Scheduled Meds: . aspirin EC  81 mg Oral Daily  . carvedilol  25 mg Oral BID WC  . citalopram  20 mg Oral Daily  . [START ON 05/08/2016] enoxaparin (LOVENOX) injection  40 mg Subcutaneous Q24H  . ezetimibe  10 mg Oral Daily  .  fentaNYL      . insulin aspart  0-9 Units Subcutaneous Q4H  . insulin glargine  8 Units Subcutaneous BID  . lidocaine (PF)      . midazolam      . piperacillin-tazobactam (ZOSYN)  IV  3.375 g Intravenous Q8H  . potassium chloride (KCL MULTIRUN) 30 mEq in 265 mL IVPB  30 mEq Intravenous Q4H  . pregabalin  200 mg Oral BID  . rOPINIRole  2 mg Oral BID   Continuous Infusions: . sodium chloride 75 mL/hr at 05/07/16 1339     LOS: 2 days   Time spent: 20 minutes   Faye Ramsay, MD [facility] Pager [phone]  If 7PM-7AM, please contact night-coverage [email or web address] Password [email or web address] 05/07/2016, 2:10 PM

## 2016-05-07 NOTE — Progress Notes (Signed)
Pt on  Her way down steps for ct

## 2016-05-07 NOTE — Procedures (Signed)
Interventional Radiology Procedure Note  Procedure: Percutaneous transhepatic cholangiogram and placement of a 62F internal/external biliary drain.   Aspirated purulent bile sent for culture.   Complications: None  Estimated Blood Loss: None  Recommendations: - Tube to bag until Br has normalized and signs of cholangitis resolved - Follow electrolytes and replete as needed - If possible (above resolved) cap tube prior to D/C.  - Return to IR in 1-2 weeks for placement of a covered metal stent  Signed,  Criselda Peaches, MD

## 2016-05-08 LAB — CBC
HCT: 27.1 % — ABNORMAL LOW (ref 36.0–46.0)
Hemoglobin: 8.5 g/dL — ABNORMAL LOW (ref 12.0–15.0)
MCH: 27.9 pg (ref 26.0–34.0)
MCHC: 31.4 g/dL (ref 30.0–36.0)
MCV: 88.9 fL (ref 78.0–100.0)
Platelets: 308 K/uL (ref 150–400)
RBC: 3.05 MIL/uL — ABNORMAL LOW (ref 3.87–5.11)
RDW: 16.6 % — ABNORMAL HIGH (ref 11.5–15.5)
WBC: 7.2 K/uL (ref 4.0–10.5)

## 2016-05-08 LAB — URINE CULTURE

## 2016-05-08 LAB — COMPREHENSIVE METABOLIC PANEL WITH GFR
ALT: 53 U/L (ref 14–54)
AST: 112 U/L — ABNORMAL HIGH (ref 15–41)
Albumin: 1.5 g/dL — ABNORMAL LOW (ref 3.5–5.0)
Alkaline Phosphatase: 745 U/L — ABNORMAL HIGH (ref 38–126)
Anion gap: 6 (ref 5–15)
BUN: 19 mg/dL (ref 6–20)
CO2: 32 mmol/L (ref 22–32)
Calcium: 8 mg/dL — ABNORMAL LOW (ref 8.9–10.3)
Chloride: 96 mmol/L — ABNORMAL LOW (ref 101–111)
Creatinine, Ser: 0.97 mg/dL (ref 0.44–1.00)
GFR calc Af Amer: >60 mL/min
GFR calc non Af Amer: 58 mL/min — ABNORMAL LOW
Glucose, Bld: 198 mg/dL — ABNORMAL HIGH (ref 65–99)
Potassium: 3.6 mmol/L (ref 3.5–5.1)
Sodium: 134 mmol/L — ABNORMAL LOW (ref 135–145)
Total Bilirubin: 9 mg/dL — ABNORMAL HIGH (ref 0.3–1.2)
Total Protein: 5.2 g/dL — ABNORMAL LOW (ref 6.5–8.1)

## 2016-05-08 LAB — GLUCOSE, CAPILLARY
GLUCOSE-CAPILLARY: 199 mg/dL — AB (ref 65–99)
GLUCOSE-CAPILLARY: 170 mg/dL — AB (ref 65–99)
Glucose-Capillary: 184 mg/dL — ABNORMAL HIGH (ref 65–99)
Glucose-Capillary: 204 mg/dL — ABNORMAL HIGH (ref 65–99)
Glucose-Capillary: 217 mg/dL — ABNORMAL HIGH (ref 65–99)
Glucose-Capillary: 262 mg/dL — ABNORMAL HIGH (ref 65–99)

## 2016-05-08 MED ORDER — FUROSEMIDE 20 MG PO TABS
20.0000 mg | ORAL_TABLET | Freq: Every day | ORAL | Status: DC
  Administered 2016-05-08 – 2016-05-11 (×4): 20 mg via ORAL
  Filled 2016-05-08 (×4): qty 1

## 2016-05-08 NOTE — Progress Notes (Signed)
Pharmacy Antibiotic Note  Melissa Myers is a 71 y.o. female admitted on 05/04/2016 with concern for intra-abdominal infection.  Pharmacy has been consulted for Zosyn dosing.  The patient is noted to have a fairly sensitive Klebsiella PNA UTI with options to narrow however new biliary drain cultures were sent 3/17 so will await these results. SCr 0.97, CrCl~50-60 ml/min. Dose remains appropriate.   Plan: Zosyn 3.375g IV q8h (4 hour infusion).  Will continue to follow renal function, culture results, LOT, and antibiotic de-escalation plans   Height: 4\' 10"  (147.3 cm) Weight: 240 lb (108.9 kg) IBW/kg (Calculated) : 40.9  Temp (24hrs), Avg:98.6 F (37 C), Min:98.5 F (36.9 C), Max:98.7 F (37.1 C)   Recent Labs Lab 05/05/16 0023 05/05/16 0355 05/06/16 0638 05/07/16 0808 05/08/16 0535  WBC 11.0*  --  7.9 5.9 7.2  CREATININE 1.38*  --  1.00 0.97 0.97  LATICACIDVEN  --  1.71  --   --   --     Estimated Creatinine Clearance: 58 mL/min (by C-G formula based on SCr of 0.97 mg/dL).    Allergies  Allergen Reactions  . Cymbalta [Duloxetine Hcl] Other (See Comments)    Restless leg syndrome  . Statins Other (See Comments)    Statin drugs cause muscle pain / "muscle damage"--was told by MD not to take  . Sulfa Antibiotics Diarrhea  . Levemir [Insulin Detemir] Itching  . Morphine Other (See Comments)    GI upset and headaches  . Zinc Swelling and Rash   Zosyn 3/15 >> Ceftriaxone x1 3/15  3/15 BCx: ngtd 3/15 UCx >> 100k K PNA (originally listed as ESBL but sens pattern did not make sense, discussed with micro and depressed ESBL result since likely a false +) 3/17 Biliary drain >>   Thank you for allowing pharmacy to be a part of this patient's care.  Alycia Rossetti, PharmD, BCPS Clinical Pharmacist Pager: [phone] 05/08/2016 11:24 AM

## 2016-05-08 NOTE — Progress Notes (Signed)
**Note Melissa-Identified via Obfuscation** Patient ID: Melissa Myers, female   DOB: Apr 15, 1945, 71 y.o.   MRN: [id number]    PROGRESS NOTE  Melissa Myers  ZDG:[id number] DOB: [birth year] DOA: 05/04/2016  PCP: [name], MD   Brief Narrative:  71 yo female with DM2 and complications of neuropathy, OSA, declines CPAP but home oxygen prn, s/p cholecystectomy 1997, tubular adenoma on 11/2012 Colonoscopy.  2009 ERCP with 12 mm sphincterotomy due to jaundice, with ? filling defect but no stones on bile duct sweep. Recent admission 1/23 until 1/29 for obstructive jaundice, 03/18/16 ERCP with biliary stent placement across malignant appearing distal CBD stricture.  CA 19-9 107, EUS 04/28/16: stent in good position.  2.9 cm mass in pancreatic uncinate obstructing main PD and bile duct.  Assessment & Plan:  Pancreatic cancer with obstructive jaundice, cholangitis, transaminitis, hyperbilirubinemia  - LFTs improved post ERCP, stent placement 02/2016 but now trending up on this admission  - ERCP on 3/16, notable for existing plastic stent clogged - and removed w/ snare.Prior biliary sphincterotomy seen. Able to cannulate and pass through distal bile duct stricture with wire and small amounts of contrast passed but could not fill entire biliary tree. Attempted occluison cholangiogram, attempted catheter and balloon dilations but nothing would allow passage of catheters through distal CBD stricture - IR consulted for drainage  - Percutaneous transhepatic cholangiogram and placement of internal/external biliary drain.   Aspirated purulent bile sent for culture --> done 3/17 by IR team - continue Zosyn day #4 - appreciate GI and IR team following  - I called cancer center, I have made official referral, information will be forwarded to Dr. 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  DVT prophylaxis:  Lovenox SQ Code Status: Full  Family Communication: Patient at bedside, family over the phone  Disposition Plan: To be determined   Consultants:   GI  Procedures:   ERCP 3/16 -->  Antimicrobials:   Zosyn 3/15 -->  Subjective: Reports feeling better.   Objective: Vitals:   05/07/16 1436 05/07/16 1746 05/07/16 2125 05/08/16 0443  BP: (!) 115/56 (!) 121/49 (!) 110/47 (!) 121/52  Pulse: (!) 51 (!) 50 (!) 58 (!) 56  Resp: 20  18 18   Temp: 98.7 F (37.1 C)  98.7 F (37.1 C) 98.5 F (36.9 C)  TempSrc: Oral  Oral Oral  SpO2: 99%  98%  99%  Weight:      Height:        Intake/Output Summary (Last 24 hours) at 05/08/16 1240 Last data filed at 05/08/16 0900  Gross per 24 hour  Intake          1978.75 ml  Output              700 ml  Net          1278.75 ml   Filed Weights   05/04/16 2337  Weight: 108.9 kg (240 lb)    Examination:  General exam: Appears somnolent but can awake, NAD, jaundiced  Respiratory system: Respiratory effort normal. Diminished breath sounds at bases  Cardiovascular system: S1 & S2 heard, RRR. No JVD, murmurs, rubs, gallops or clicks. Trace bilateral pitting edema Gastrointestinal system: Abdomen is nondistended, Normal bowel sounds heard.  Data  Reviewed: I have personally reviewed following labs and imaging studies  CBC:  Recent Labs Lab 05/05/16 0023 05/06/16 0638 05/07/16 0808 05/08/16 0535  WBC 11.0* 7.9 5.9 7.2  NEUTROABS 8.1*  --   --   --   HGB 11.4* 10.0* 8.8* 8.5*  HCT 33.9* 31.4* 28.2* 27.1*  MCV 86.3 86.7 89.8 88.9  PLT 347 327 275 151   Basic Metabolic Panel:  Recent Labs Lab 05/05/16 0023 05/06/16 0638 05/07/16 0808 05/08/16 0535  NA 131* 138 138 134*  K 4.3 2.9* 2.9* 3.6  CL 86* 91* 97* 96*  CO2 30 34* 34* 32  GLUCOSE 416* 212* 201* 198*  BUN 24* 13 15 19   CREATININE 1.38* 1.00 0.97 0.97  CALCIUM 8.5* 8.0* 7.7* 8.0*   Liver Function Tests:  Recent Labs Lab 05/05/16 0023 05/06/16 0638 05/07/16 0808 05/08/16 0535  AST 127* 130* 113* 112*  ALT 69* 59* 54 53  ALKPHOS 752* 712* 677* 745*  BILITOT 3.7* 6.7* 8.6* 9.0*  PROT 6.8 5.6* 5.3* 5.2*  ALBUMIN 2.4* 1.9* 1.6* 1.5*    Recent Labs Lab 05/05/16 0023  LIPASE 27    Recent Labs Lab 05/05/16 0023  AMMONIA 51*   Coagulation Profile:  Recent Labs Lab 05/05/16 0023  INR 0.97   Cardiac Enzymes:  Recent Labs Lab 05/05/16 0023  TROPONINI <0.03   BNP (last 3 results)  Recent Labs  11/04/15 0956  PROBNP 20.0   CBG:  Recent Labs Lab 05/07/16 2122 05/08/16 0004 05/08/16 0514 05/08/16 0803 05/08/16 1106  GLUCAP 264* 262* 199* 184* 170*   Urine analysis:    Component Value Date/Time   COLORURINE YELLOW 05/05/2016 0219   APPEARANCEUR CLEAR 05/05/2016 0219   LABSPEC 1.007 05/05/2016 0219   PHURINE 5.0 05/05/2016 0219   GLUCOSEU >=500 (A) 05/05/2016 0219   GLUCOSEU NEGATIVE 11/04/2015 0956   HGBUR NEGATIVE 05/05/2016 0219   BILIRUBINUR NEGATIVE 05/05/2016 0219   BILIRUBINUR neg 08/17/2015 1547   KETONESUR NEGATIVE 05/05/2016 0219   PROTEINUR NEGATIVE 05/05/2016 0219   UROBILINOGEN 0.2 11/04/2015 0956   NITRITE POSITIVE (A) 05/05/2016 0219   LEUKOCYTESUR TRACE (A) 05/05/2016 0219   Recent Results (from the  past 240 hour(s))  Urine culture     Status: Abnormal   Collection Time: 05/05/16  2:19 AM  Result Value Ref Range Status   Specimen Description URINE, CATHETERIZED  Final   Special Requests NONE  Final   Culture >=100,000 COLONIES/mL KLEBSIELLA PNEUMONIAE (A)  Final   Report Status 05/08/2016 FINAL  Final   Organism ID, Bacteria KLEBSIELLA PNEUMONIAE (A)  Final      Susceptibility  Klebsiella pneumoniae - MIC*    AMPICILLIN >=32 RESISTANT Resistant     CEFAZOLIN <=4 SENSITIVE Sensitive     CEFTRIAXONE <=1 SENSITIVE Sensitive     CIPROFLOXACIN <=0.25 SENSITIVE Sensitive     GENTAMICIN <=1 SENSITIVE Sensitive     IMIPENEM <=0.25 SENSITIVE Sensitive     NITROFURANTOIN 64 INTERMEDIATE Intermediate     TRIMETH/SULFA <=20 SENSITIVE Sensitive     AMPICILLIN/SULBACTAM >=32 RESISTANT Resistant     PIP/TAZO 16 SENSITIVE Sensitive     * >=100,000 COLONIES/mL KLEBSIELLA PNEUMONIAE  Culture, blood (Routine X 2) w Reflex to ID Panel     Status: None (Preliminary result)   Collection Time: 05/05/16  3:34 AM  Result Value Ref Range Status   Specimen Description BLOOD RIGHT ARM  Final   Special Requests AEROBIC BOTTLE ONLY 10ML ROCEPHIN  Final   Culture NO GROWTH 2 DAYS  Final   Report Status PENDING  Incomplete  Culture, blood (Routine X 2) w Reflex to ID Panel     Status: None (Preliminary result)   Collection Time: 05/05/16  3:42 AM  Result Value Ref Range Status   Specimen Description BLOOD RIGHT HAND  Final   Special Requests IN PEDIATRIC BOTTLE 4ML ROCEPHIN  Final   Culture NO GROWTH 2 DAYS  Final   Report Status PENDING  Incomplete  Body fluid culture     Status: None (Preliminary result)   Collection Time: 05/07/16 10:37 AM  Result Value Ref Range Status   Specimen Description FLUID  Final   Special Requests BILIARY DRAIN  Final   Gram Stain   Final    ABUNDANT WBC PRESENT, PREDOMINANTLY PMN NO ORGANISMS SEEN    Culture   Final    CULTURE REINCUBATED FOR BETTER GROWTH CRITICAL  RESULT CALLED TO, READ BACK BY AND VERIFIED WITH: C WHITESIDE,RN AT 0910 05/08/16 BY L BENFIELD    Report Status PENDING  Incomplete    Radiology Studies: Ct Chest Wo Contrast  Result Date: 05/07/2016 CLINICAL DATA:  Pancreatic mass, recent bile ducts stent placement EXAM: CT CHEST WITHOUT CONTRAST TECHNIQUE: Multidetector CT imaging of the chest was performed following the standard protocol without IV contrast. COMPARISON:  CT abdomen from 05/05/2016 FINDINGS: Cardiovascular: Aortic arch and left anterior descending coronary artery atherosclerotic calcification. Calcified mitral valve. Mild cardiomegaly. Mediastinum/Nodes: Small paratracheal lymph nodes are present better not appreciably pathologically enlarged. A right hilar node measures 1 cm in short axis on image 56/ 3, borderline prominent. Lungs/Pleura: Scattered bandlike subsegmental atelectasis in both lungs. Centrilobular emphysema. Increased medial atelectasis in the left lower lobe compared to the prior CT abdomen. Small right and trace left pleural effusions with some fluid tracking in the major fissures. Pleural effusions appear new compared to 05/05/16. Upper Abdomen: High density material in the pancreatic duct, probably contrast, only partially included on today' s exam. Musculoskeletal: Subacute healing fracture the right seventh rib laterally. Old healed right third rib fracture. Thoracic spondylosis and mild thoracic kyphosis. IMPRESSION: 1. New small right and trace left pleural effusions. Mild cardiomegaly. 2. Borderline enlarged right hilar lymph node at 1 cm in short axis. 3. No definite findings of metastatic disease to the chest. 4. Centrilobular emphysema. 5. High density in the visualized part of the dorsal pancreatic duct, probably contrast medium. 6. Subacute healing fracture of the right seventh rib laterally. 7. Atherosclerosis, including the left anterior descending coronary artery. Electronically Signed   By: Van Clines  M.D.   On: 05/07/2016 12:03   Dg  Ercp  Result Date: 05/06/2016 CLINICAL DATA:  Adenocarcinoma of the pancreas, cholangitis, jaundice and previous endoscopic biliary stent placement. EXAM: ERCP TECHNIQUE: Multiple spot images obtained with the fluoroscopic device and submitted for interpretation post-procedure. COMPARISON:  CT of the abdomen on 05/05/2016 as well as multiple additional prior imaging studies. FINDINGS: Imaging during ERCP demonstrates cannulation of the common bile duct and removal of an indwelling biliary stent. A guidewire was able to be passed through a biliary stricture. The stent was not able to be replaced. IMPRESSION: Imaging demonstrating removal of endoscopic common bile duct stent. The stent was not able to be replaced. These images were submitted for radiologic interpretation only. Please see the procedural report for the amount of contrast and the fluoroscopy time utilized. Electronically Signed   By: Aletta Edouard M.D.   On: 05/06/2016 14:47   Ir Biliary Drain Placement With Cholangiogram  Result Date: 05/07/2016 INDICATION: 72 year old female with malignant obstructed jaundice and cholangitis. She recently had a plastic biliary drain placed earlier this month but presents with an occluded drain and inability to pass through the distal common bile duct stricture on ERCP. She requires percutaneous transhepatic cholangiogram and drain placement. EXAM: PRIOR PERCUTANEOUS TRANSHEPATIC CHOLANGIOGRAM AND PLACEMENT OF AN INTERNAL/ EXTERNAL BILIARY DRAINAGE CATHETER MEDICATIONS: Patient is currently receiving 3.375 g Zosyn IV. No additional antibiotic given ANESTHESIA/SEDATION: Moderate (conscious) sedation was employed during this procedure. A total of Versed 2.5 mg and Fentanyl 100 mcg was administered intravenously. Moderate Sedation Time: 20 minutes. The patient's level of consciousness and vital signs were monitored continuously by radiology nursing throughout the procedure under  my direct supervision. FLUOROSCOPY TIME:  Fluoroscopy Time: 3 minutes 18 seconds (62 mGy). COMPLICATIONS: None immediate. PROCEDURE: Informed written consent was obtained from the patient after a thorough discussion of the procedural risks, benefits and alternatives. All questions were addressed. Maximal Sterile Barrier Technique was utilized including caps, mask, sterile gowns, sterile gloves, sterile drape, hand hygiene and skin antiseptic. A timeout was performed prior to the initiation of the procedure. The right upper quadrant was interrogated with ultrasound. Interval development of progressive biliary ductal dilatation. Dilated bile ducts can be identified sonographically. A suitable posterior division all duct in the right liver was identified. The overlying skin was anesthetized with 1% lidocaine. Under real-time sonographic guidance, the bile duct was punctured with a 21 gauge Accustick needle in a single pass. There is immediate return of turbid bile through the needle hub. A gentle hand injection of contrast material was performed in a limited cholangiogram was obtained. There is extensive intra hepatic biliary ductal dilatation. The 0.018 wire was advanced into the biliary system centrally. The Accustick needle was then exchanged for the Accustick sheath which was advanced into the common hepatic duct. Additional contrast injection was performed. There is a high-grade lung segment stenosis of the common bile duct. A small wisp of contrast material passes through the stricture and into the duodenum. An Amplatz wire was successfully advanced through the stricture and into the duodenum. The skin tract was then dilated to 10 [place] and a Cook 10 French internal/external biliary drainage catheter was advanced over the wire and positioned with the locking loop in the duodenum. The tube was connected to bag drainage and secured to the skin with 0 Prolene suture. A sterile bandage was applied. The patient  tolerated the procedure well. IMPRESSION: 1. Percutaneous transhepatic: Jaw grossly demonstrates progressive intra and extrahepatic biliary ductal dilatation with a high-grade a relatively long segment malignant stricture of the  common bile duct. 2. Successful placement of a 10 French internal/external biliary drainage catheter. Aspirated bile is frankly purulent. A sample was sent for culture. PLAN: 1. Maintain biliary drain to bag drainage until symptoms of cholangitis have resolved and bilirubin has nearly normalized. 2. It would be preferable to cap the biliary drain prior to discharge if the above can be accomplished. 3. Follow electrolytes and replete as needed. 4. Return to interventional radiology in 2-4 weeks for biliary stent placement. Signed, Criselda Peaches, MD [facility] Radiology Electronically Signed   By: Jacqulynn Cadet M.D.   On: 05/07/2016 11:22      Scheduled Meds: . aspirin EC  81 mg Oral Daily  . carvedilol  25 mg Oral BID WC  . citalopram  20 mg Oral Daily  . enoxaparin (LOVENOX) injection  40 mg Subcutaneous Q24H  . ezetimibe  10 mg Oral Daily  . insulin aspart  0-9 Units Subcutaneous Q4H  . insulin glargine  8 Units Subcutaneous BID  . piperacillin-tazobactam (ZOSYN)  IV  3.375 g Intravenous Q8H  . pregabalin  200 mg Oral BID  . rOPINIRole  2 mg Oral BID   Continuous Infusions: . sodium chloride 75 mL/hr at 05/07/16 1339     LOS: 3 days   Time spent: 20 minutes   Faye Ramsay, MD [facility] Pager [phone]  If 7PM-7AM, please contact night-coverage [email or web address] Password [facility] 05/08/2016, 12:40 PM

## 2016-05-09 ENCOUNTER — Encounter (HOSPITAL_COMMUNITY): Payer: Self-pay | Admitting: Internal Medicine

## 2016-05-09 LAB — CBC
HCT: 26.8 % — ABNORMAL LOW (ref 36.0–46.0)
HEMOGLOBIN: 8.3 g/dL — AB (ref 12.0–15.0)
MCH: 27.5 pg (ref 26.0–34.0)
MCHC: 31 g/dL (ref 30.0–36.0)
MCV: 88.7 fL (ref 78.0–100.0)
PLATELETS: 325 10*3/uL (ref 150–400)
RBC: 3.02 MIL/uL — ABNORMAL LOW (ref 3.87–5.11)
RDW: 16.3 % — ABNORMAL HIGH (ref 11.5–15.5)
WBC: 6.4 10*3/uL (ref 4.0–10.5)

## 2016-05-09 LAB — COMPREHENSIVE METABOLIC PANEL
ALK PHOS: 673 U/L — AB (ref 38–126)
ALT: 44 U/L (ref 14–54)
ANION GAP: 9 (ref 5–15)
AST: 72 U/L — ABNORMAL HIGH (ref 15–41)
Albumin: 1.5 g/dL — ABNORMAL LOW (ref 3.5–5.0)
BUN: 20 mg/dL (ref 6–20)
CALCIUM: 8.4 mg/dL — AB (ref 8.9–10.3)
CO2: 32 mmol/L (ref 22–32)
CREATININE: 0.98 mg/dL (ref 0.44–1.00)
Chloride: 96 mmol/L — ABNORMAL LOW (ref 101–111)
GFR, EST NON AFRICAN AMERICAN: 57 mL/min — AB (ref >60)
Glucose, Bld: 173 mg/dL — ABNORMAL HIGH (ref 65–99)
Potassium: 3.6 mmol/L (ref 3.5–5.1)
Sodium: 137 mmol/L (ref 135–145)
TOTAL PROTEIN: 5.3 g/dL — AB (ref 6.5–8.1)
Total Bilirubin: 6.3 mg/dL — ABNORMAL HIGH (ref 0.3–1.2)

## 2016-05-09 LAB — GLUCOSE, CAPILLARY
GLUCOSE-CAPILLARY: 159 mg/dL — AB (ref 65–99)
GLUCOSE-CAPILLARY: 209 mg/dL — AB (ref 65–99)
GLUCOSE-CAPILLARY: 225 mg/dL — AB (ref 65–99)
Glucose-Capillary: 170 mg/dL — ABNORMAL HIGH (ref 65–99)
Glucose-Capillary: 208 mg/dL — ABNORMAL HIGH (ref 65–99)

## 2016-05-09 LAB — PREALBUMIN: PREALBUMIN: 7.4 mg/dL — AB (ref 18–38)

## 2016-05-09 NOTE — Progress Notes (Signed)
Patient ID: Melissa Myers, female   DOB: 26-Apr-1945, 72 y.o.   MRN: [id number]    PROGRESS NOTE  Melissa Myers  BSJ:[id number] DOB: 09-15-1945 DOA: 05/04/2016  PCP: [name], MD   Brief Narrative:  71 yo female with DM2 and complications of neuropathy, OSA, declines CPAP but home oxygen prn, s/p cholecystectomy 1997, tubular adenoma on 11/2012 Colonoscopy.  2009 ERCP with 12 mm sphincterotomy due to jaundice, with ? filling defect but no stones on bile duct sweep. Recent admission 1/23 until 1/29 for obstructive jaundice, 03/18/16 ERCP with biliary stent placement across malignant appearing distal CBD stricture.  CA 19-9 107, EUS 04/28/16: stent in good position.  2.9 cm mass in pancreatic uncinate obstructing main PD and bile duct.  Assessment & Plan:  Pancreatic cancer with obstructive jaundice, cholangitis, transaminitis, hyperbilirubinemia  - LFTs improved post ERCP, stent placement 02/2016 but now trending up on this admission  - ERCP on 3/16, notable for existing plastic stent clogged - and removed w/ snare.Prior biliary sphincterotomy seen. Able to cannulate and pass through distal bile duct stricture with wire and small amounts of contrast passed but could not fill entire biliary tree. Attempted occluison cholangiogram, attempted catheter and balloon dilations but nothing would allow passage of catheters through distal CBD stricture - IR consulted for drainage  - Percutaneous transhepatic cholangiogram and placement of internal/external biliary drain.   Aspirated purulent bile sent for culture --> done 3/17 by IR team - continue Zosyn day #5 - appreciate GI and IR team following  - I called cancer center, I have made official referral, information will be forwarded to Dr. Burr Medico, she was to see pt On 3/20 at 2 pm  - I forwarded this not to Dr. 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  - on lantus - keep on SSI for now                   Acute kidney injury - pre renal - Cr is now WNL, stopped IVF  Chronic diastolic CHF - last ECHO 76/5465 with EF 65% and grade I diastolic CHF  - daily weights - weight trend below  [facility] Weights   05/04/16 2337 05/08/16 1700 05/09/16 0459  Weight: 108.9 kg (240 lb) 111.7 kg (246 lb 4.1 oz) 111.9 kg (246 lb 12.8 oz)  - gave dose of Lasix 3/18 and 3/19  OSA - can use CPAP at night if pt agrees to it  UTI, Klebsiella  - Zosyn should be adequate based on sensitivity report   Hypokalemia - supplemented and WNL  Hyponatremia - pre renal  - resolved with IVF  Morbid obesity  - Body mass index is 50.16 kg/m.  DVT prophylaxis:  Lovenox SQ Code Status: Full  Family Communication: Patient at bedside, family over the phone  Disposition Plan: To be determined   Consultants:   GI  IR  Surgery   Procedures:   1/26 ERCP with sphincterotomy and stent placement across malignant CBD stricture.    3/8 EUS.  Mass (adenocarcinoma) at uncinate.    3/15  admission with cholangitis, recurrent jaundice.  Abx inititated, Zosyn day 5.   3/16 ERCP.  Clogged stent removed, unable to pass wire and place new stent across severe malignant stricture.   3/17 Dr Laurence Ferrari placed 14fr int/ext biliary drain, aspirated purulent bile. Blood and bile clx all pending.    Remains jaundiced but LFTs improving.  WBCs normalized. Bile is no longer purulent.    Antimicrobials:   Zosyn 3/15 -->  Subjective: Reports feeling better.   Objective: Vitals:   05/08/16 1700 05/08/16 2131 05/09/16 0459 05/09/16 1435  BP:  (!) 133/54 (!) 120/45 (!) 132/52  Pulse:  (!) 48 (!) 52 (!) 55  Resp:  18  18 20   Temp:  98.5 F (36.9 C) 99.2 F (37.3 C) 97.8 F (36.6 C)  TempSrc:  Oral Oral Oral  SpO2:  97% 97% 91%  Weight: 111.7 kg (246 lb 4.1 oz)  111.9 kg (246 lb 12.8 oz)   Height:        Intake/Output Summary (Last 24 hours) at 05/09/16 1712 Last data filed at 05/09/16 1631  Gross per 24 hour  Intake                0 ml  Output              400 ml  Net             -400 ml   Filed Weights   05/04/16 2337 05/08/16 1700 05/09/16 0459  Weight: 108.9 kg (240 lb) 111.7 kg (246 lb 4.1 oz) 111.9 kg (246 lb 12.8 oz)    Examination:  General exam: Appears somnolent but can awake, NAD, jaundiced  Respiratory system: Respiratory effort normal. Diminished breath sounds at bases  Cardiovascular system: S1 & S2 heard, RRR. No JVD, murmurs, rubs, gallops or clicks. Trace bilateral pitting edema Gastrointestinal system: Abdomen is nondistended, Normal bowel sounds heard. Slightly tender in epigastric area   Data Reviewed: I have personally reviewed following labs and imaging studies  CBC:  Recent Labs Lab 05/05/16 0023 05/06/16 0638 05/07/16 0808 05/08/16 0535 05/09/16 0419  WBC 11.0* 7.9 5.9 7.2 6.4  NEUTROABS 8.1*  --   --   --   --   HGB 11.4* 10.0* 8.8* 8.5* 8.3*  HCT 33.9* 31.4* 28.2* 27.1* 26.8*  MCV 86.3 86.7 89.8 88.9 88.7  PLT 347 327 275 308 979   Basic Metabolic Panel:  Recent Labs Lab 05/05/16 0023 05/06/16 0638 05/07/16 0808 05/08/16 0535 05/09/16 0419  NA 131* 138 138 134* 137  K 4.3 2.9* 2.9* 3.6 3.6  CL 86* 91* 97* 96* 96*  CO2 30 34* 34* 32 32  GLUCOSE 416* 212* 201* 198* 173*  BUN 24* 13 15 19 20   CREATININE 1.38* 1.00 0.97 0.97 0.98  CALCIUM 8.5* 8.0* 7.7* 8.0* 8.4*   Liver Function Tests:  Recent Labs Lab 05/05/16 0023 05/06/16 0638 05/07/16 0808 05/08/16 0535 05/09/16 0419  AST 127* 130* 113* 112* 72*  ALT 69* 59* 54 53 44  ALKPHOS 752* 712* 677* 745* 673*  BILITOT 3.7* 6.7* 8.6* 9.0* 6.3*  PROT 6.8 5.6* 5.3* 5.2* 5.3*  ALBUMIN  2.4* 1.9* 1.6* 1.5* 1.5*    Recent Labs Lab 05/05/16 0023  LIPASE 27    Recent Labs Lab 05/05/16 0023  AMMONIA 51*   Coagulation Profile:  Recent Labs Lab 05/05/16 0023  INR 0.97   Cardiac Enzymes:  Recent Labs Lab 05/05/16 0023  TROPONINI <0.03   BNP (last 3 results)  Recent Labs  11/04/15 0956  PROBNP 20.0   CBG:  Recent Labs Lab 05/08/16 2004 05/09/16 0016 05/09/16 0520 05/09/16 0808 05/09/16 1449  GLUCAP 217* 208* 170* 159* 209*   Urine analysis:    Component Value Date/Time   COLORURINE YELLOW 05/05/2016 0219   APPEARANCEUR CLEAR 05/05/2016 0219   LABSPEC 1.007 05/05/2016 0219   PHURINE 5.0 05/05/2016 0219   GLUCOSEU >=500 (A) 05/05/2016 0219   GLUCOSEU NEGATIVE 11/04/2015 0956   HGBUR NEGATIVE 05/05/2016 0219   BILIRUBINUR NEGATIVE 05/05/2016 0219   BILIRUBINUR neg 08/17/2015 1547   KETONESUR NEGATIVE 05/05/2016 0219   PROTEINUR NEGATIVE 05/05/2016 0219   UROBILINOGEN 0.2 11/04/2015 0956   NITRITE POSITIVE (A) 05/05/2016 0219   LEUKOCYTESUR TRACE (A) 05/05/2016 0219   Recent Results (from the past 240 hour(s))  Urine culture     Status: Abnormal   Collection Time: 05/05/16  2:19 AM  Result Value Ref Range Status   Specimen Description URINE, CATHETERIZED  Final   Special Requests NONE  Final   Culture >=100,000 COLONIES/mL KLEBSIELLA PNEUMONIAE (A)  Final   Report Status 05/08/2016 FINAL  Final   Organism ID, Bacteria KLEBSIELLA PNEUMONIAE (A)  Final      Susceptibility   Klebsiella pneumoniae - MIC*    AMPICILLIN >=32 RESISTANT Resistant     CEFAZOLIN <=4 SENSITIVE Sensitive     CEFTRIAXONE <=1 SENSITIVE Sensitive     CIPROFLOXACIN <=0.25 SENSITIVE Sensitive     GENTAMICIN <=1 SENSITIVE Sensitive     IMIPENEM <=0.25 SENSITIVE Sensitive     NITROFURANTOIN 64 INTERMEDIATE Intermediate     TRIMETH/SULFA <=20 SENSITIVE Sensitive     AMPICILLIN/SULBACTAM >=32 RESISTANT Resistant     PIP/TAZO 16 SENSITIVE Sensitive     * >=100,000  COLONIES/mL KLEBSIELLA PNEUMONIAE  Culture, blood (Routine X 2) w Reflex to ID Panel     Status: None (Preliminary result)   Collection Time: 05/05/16  3:34 AM  Result Value Ref Range Status   Specimen Description BLOOD RIGHT ARM  Final   Special Requests AEROBIC BOTTLE ONLY 10ML ROCEPHIN  Final   Culture NO GROWTH 4 DAYS  Final   Report Status PENDING  Incomplete  Culture, blood (Routine X 2) w Reflex to ID Panel     Status: None (Preliminary result)   Collection Time: 05/05/16  3:42 AM  Result Value Ref Range Status   Specimen Description BLOOD RIGHT HAND  Final   Special Requests IN PEDIATRIC BOTTLE 4ML ROCEPHIN  Final   Culture NO GROWTH 4 DAYS  Final   Report Status PENDING  Incomplete  Body fluid culture     Status: None (Preliminary result)   Collection Time: 05/07/16 10:37 AM  Result Value Ref Range Status   Specimen Description FLUID  Final   Special Requests BILIARY DRAIN  Final   Gram Stain   Final    ABUNDANT WBC PRESENT, PREDOMINANTLY PMN NO ORGANISMS SEEN    Culture   Final    CULTURE REINCUBATED FOR BETTER GROWTH CRITICAL RESULT CALLED TO, READ BACK BY AND VERIFIED WITH: C WHITESIDE,RN AT 0910 05/08/16 BY L BENFIELD    Report Status PENDING  Incomplete    Radiology Studies: No results found.    Scheduled Meds: . aspirin EC  81 mg Oral Daily  . carvedilol  25 mg Oral BID WC  . citalopram  20 mg Oral Daily  . enoxaparin (LOVENOX) injection  40 mg Subcutaneous Q24H  . ezetimibe  10 mg Oral Daily  .  furosemide  20 mg Oral Daily  . insulin aspart  0-9 Units Subcutaneous Q4H  . insulin glargine  8 Units Subcutaneous BID  . piperacillin-tazobactam (ZOSYN)  IV  3.375 g Intravenous Q8H  . pregabalin  200 mg Oral BID  . rOPINIRole  2 mg Oral BID   Continuous Infusions:    LOS: 4 days   Time spent: 20 minutes   Faye Ramsay, MD [facility] Pager [phone]  If 7PM-7AM, please contact night-coverage [email or web address] Password [facility] 05/09/2016,  5:12 PM

## 2016-05-09 NOTE — Progress Notes (Signed)
Daily Rounding Note  05/09/2016, 8:40 AM  LOS: 4 days   SUBJECTIVE:   Chief complaint:     Appetite improved.   100 CC/day bile drain output in previous 2 days.    OBJECTIVE:         Vital signs in last 24 hours:    Temp:  [98.5 F (36.9 C)-99.2 F (37.3 C)] 99.2 F (37.3 C) (03/19 0459) Pulse Rate:  [48-77] 52 (03/19 0459) Resp:  [18] 18 (03/19 0459) BP: (118-133)/(45-71) 120/45 (03/19 0459) SpO2:  [72 %-97 %] 97 % (03/19 0459) Weight:  [111.7 kg (246 lb 4.1 oz)-111.9 kg (246 lb 12.8 oz)] 111.9 kg (246 lb 12.8 oz) (03/19 0459) Last BM Date: 05/06/16 Filed Weights   05/04/16 2337 05/08/16 1700 05/09/16 0459  Weight: 108.9 kg (240 lb) 111.7 kg (246 lb 4.1 oz) 111.9 kg (246 lb 12.8 oz)   General: obese, mildly jaundiced   Heart: RRR Chest: coarse rales at right base Abdomen: obese, soft, ND.  Slight tenderness at Perc drain site.  Drainage is brown, clear, bilious.    Extremities: non-pitting UE and LE edema.   Neuro/Psych:  Oriented x place, birthdate but not to year, month.  Fully alert and follows commands.  No asterixis or tremor.    Intake/Output from previous day: 03/18 0701 - 03/19 0700 In: 120 [P.O.:120] Out: 300 [Urine:200; Drains:100]  Intake/Output this shift: No intake/output data recorded.  Lab Results:  Recent Labs  05/07/16 0808 05/08/16 0535 05/09/16 0419  WBC 5.9 7.2 6.4  HGB 8.8* 8.5* 8.3*  HCT 28.2* 27.1* 26.8*  PLT 275 308 325   BMET  Recent Labs  05/07/16 0808 05/08/16 0535 05/09/16 0419  NA 138 134* 137  K 2.9* 3.6 3.6  CL 97* 96* 96*  CO2 34* 32 32  GLUCOSE 201* 198* 173*  BUN 15 19 20   CREATININE 0.97 0.97 0.98  CALCIUM 7.7* 8.0* 8.4*   LFT  Recent Labs  05/07/16 0808 05/08/16 0535 05/09/16 0419  PROT 5.3* 5.2* 5.3*  ALBUMIN 1.6* 1.5* 1.5*  AST 113* 112* 72*  ALT 54 53 44  ALKPHOS 677* 745* 673*  BILITOT 8.6* 9.0* 6.3*   PT/INR No results for  input(s): LABPROT, INR in the last 72 hours. Hepatitis Panel No results for input(s): HEPBSAG, HCVAB, HEPAIGM, HEPBIGM in the last 72 hours.  Studies/Results: Ir Biliary Drain Placement With Cholangiogram  Result Date: 05/07/2016 INDICATION: 71 year old female with malignant obstructed jaundice and cholangitis. She recently had a plastic biliary drain placed earlier this month but presents with an occluded drain and inability to pass through the distal common bile duct stricture on ERCP. She requires percutaneous transhepatic cholangiogram and drain placement. EXAM: PRIOR PERCUTANEOUS TRANSHEPATIC CHOLANGIOGRAM AND PLACEMENT OF AN INTERNAL/ EXTERNAL BILIARY DRAINAGE CATHETER MEDICATIONS: Patient is currently receiving 3.375 g Zosyn IV. No additional antibiotic given ANESTHESIA/SEDATION: Moderate (conscious) sedation was employed during this procedure. A total of Versed 2.5 mg and Fentanyl 100 mcg was administered intravenously. Moderate Sedation Time: 20 minutes. The patient's level of consciousness and vital signs were monitored continuously by radiology nursing throughout the procedure under my direct supervision. FLUOROSCOPY TIME:  Fluoroscopy Time: 3 minutes 18 seconds (62 mGy). COMPLICATIONS: None immediate. PROCEDURE: Informed written consent was obtained from the patient after a thorough discussion of the procedural risks, benefits and alternatives. All questions were addressed. Maximal Sterile Barrier Technique was utilized including caps, mask, sterile gowns, sterile gloves, sterile drape, hand hygiene  and skin antiseptic. A timeout was performed prior to the initiation of the procedure. The right upper quadrant was interrogated with ultrasound. Interval development of progressive biliary ductal dilatation. Dilated bile ducts can be identified sonographically. A suitable posterior division all duct in the right liver was identified. The overlying skin was anesthetized with 1% lidocaine. Under  real-time sonographic guidance, the bile duct was punctured with a 21 gauge Accustick needle in a single pass. There is immediate return of turbid bile through the needle hub. A gentle hand injection of contrast material was performed in a limited cholangiogram was obtained. There is extensive intra hepatic biliary ductal dilatation. The 0.018 wire was advanced into the biliary system centrally. The Accustick needle was then exchanged for the Accustick sheath which was advanced into the common hepatic duct. Additional contrast injection was performed. There is a high-grade lung segment stenosis of the common bile duct. A small wisp of contrast material passes through the stricture and into the duodenum. An Amplatz wire was successfully advanced through the stricture and into the duodenum. The skin tract was then dilated to 10 [place] and a Cook 10 French internal/external biliary drainage catheter was advanced over the wire and positioned with the locking loop in the duodenum. The tube was connected to bag drainage and secured to the skin with 0 Prolene suture. A sterile bandage was applied. The patient tolerated the procedure well. IMPRESSION: 1. 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 2. Successful placement of a 10 French internal/external biliary drainage catheter. Aspirated bile is frankly purulent. A sample was sent for culture. PLAN: 1. Maintain biliary drain to bag drainage until symptoms of cholangitis have resolved and bilirubin has nearly normalized. 2. It would be preferable to cap the biliary drain prior to discharge if the above can be accomplished. 3. Follow electrolytes and replete as needed. 4. Return to interventional radiology in 2-4 weeks for biliary stent placement. Signed, Criselda Peaches, MD Vascular and Interventional Radiology  Specialists [facility] Radiology Electronically Signed   By: Jacqulynn Cadet M.D.   On: 05/07/2016 11:22   Scheduled Meds: . aspirin EC  81 mg Oral Daily  . carvedilol  25 mg Oral BID WC  . citalopram  20 mg Oral Daily  . enoxaparin (LOVENOX) injection  40 mg Subcutaneous Q24H  . ezetimibe  10 mg Oral Daily  . furosemide  20 mg Oral Daily  . insulin aspart  0-9 Units Subcutaneous Q4H  . insulin glargine  8 Units Subcutaneous BID  . piperacillin-tazobactam (ZOSYN)  IV  3.375 g Intravenous Q8H  . pregabalin  200 mg Oral BID  . rOPINIRole  2 mg Oral BID   Continuous Infusions: PRN Meds:.HYDROcodone-acetaminophen, HYDROmorphone (DILAUDID) injection, ondansetron **OR** ondansetron (ZOFRAN) IV   ASSESMENT:   *  Obstructive jaundice late 02/2016.  1/26 ERCP with sphincterotomy and stent placement across malignant CBD stricture.   3/8 EUS.  Mass (adenocarcinoma) at uncinate.   3/15 admission with cholangitis, recurrent jaundice.  Abx inititated, Zosyn day 5.  3/16 ERCP.  Clogged stent removed, unable to pass wire and place new stent across severe malignant stricture.  3/17 Dr Laurence Ferrari placed 66fr int/ext biliary drain, aspirated purulent bile. Blood and bile clx all pending.   Remains jaundiced but LFTs improving.  WBCs normalized. Bile is no longer purulent.    *  Mild elevation ammonia (51) level in non-cirrhotic.  Remains not oriented to year, month but fully oriented to hospital and self.  CT head 3/15 unremarkable.   *  Protein cal malnutrition (PCM).  Albumin 1.5.  Thankfully appetite improved.   *  Normocytic anemia.   11/2012 Colonoscopy with polypectomy Olevia Perches, screening).  Tubular adenoma removed   *  IDDM, historically poor control.    *  AKI.  Resolved.   *  Klebsiella UTI.  Sensitive to Zosyn.   *  Physical deconditioning.  Baseline several weeks ago was ambulatory with walker at home but able to cook, peform ADLs.      PLAN   *  Internalization of biliary drain  per IR.  Continue Zosyn, will discuss length of therapy with MD.    *  Family tells be Dr Barry Dienes is to see pt today in hospital as pt not able to make the outpt appt set for today.  Word is that Dr Burr Medico (onc) will also see her as inpt.   *  Needs PT/OT evals and improved conditioning for discharge home and suspect this will be a factor in decision re: surgery.   Azucena Freed  05/09/2016, 8:40 AM Pager: [phone]  GI ATTENDING  Interval history data reviewed. Patient seen and examined. Agree with interval progress note as outlined Successful interventional radiology drainage procedure after patient presented with early cholangitis secondary to biliary stent occlusion (malignant obstructive jaundice secondary to pancreatic cancer). Overall she is feeling better but remains significantly debilitated. Agree with continuing antibiotics. Management of internal/external drain per interventional radiology.  Docia Chuck. Geri Seminole., M.D. [facility] Division of Gastroenterology

## 2016-05-09 NOTE — Care Management Important Message (Signed)
Important Message  Patient Details  Name: Melissa Myers MRN: [id number] Date of Birth: Nov 26, 1945   Medicare Important Message Given:  Yes    [name] May 05/09/2016, 1:04 PM

## 2016-05-09 NOTE — Consult Note (Signed)
[facility] Surgery Consult/Admission Note  Melissa Myers 1945/04/11  [id number].    Requesting MD: Dr. Doyle Askew Chief Complaint/Reason for Consult: pancreatic cancer  HPI:  Melissa Myers is a 71 y.o. female with a past medical history significant for IDDM, HFpEF, HTN, and recent pancreatic mass followed by [facility] underwent biliary stent 04/28/16 who presented to the ED with fever, abdominal pain, malaise, confusion.She was admitted in January with a new pancreatic mass and obstructive jaundice.  Had ERCP and biliary stent placed, Tbili >17 and came down to 1.3 last month.  One week ago, she had EUS and aspiration and since then has been home. According to her husband she was complaining of more pain and headache not relieved with Vicodin, and then got confused, which prompted them to call EMS  Patient was not able to provide history at this time. She did shake her head no when asked if her abdomen hurt. Much of the information in the HPI was obtained from the patients EMR.   ROS:  Review of Systems  Unable to perform ROS: Acuity of condition     Family History  Problem Relation Age of Onset  . Heart disease Mother   . Heart attack Mother 44  . Heart disease Father   . Heart attack Father 11  . Heart disease      family history  . Stomach cancer Paternal Grandmother   . Lung cancer Paternal Grandfather   . CVA      several aunts  . Heart attack      several aunts and an uncle  . Colon cancer Neg Hx     Past Medical History:  Diagnosis Date  . Anemia   . Cellulitis    LOWER EXTREMITIES  . Chronic diarrhea    a/w nausea - felt related to IBS  . Deaf    left side only  . Diastolic CHF ([place])   . Disc degeneration, lumbar   . GERD (gastroesophageal reflux disease)   . Hyperlipidemia    hx rhabdo on statins  . Hypertension   . Neuropathy (HCC)    feet, toes and fingers  . On home oxygen therapy    uses oxygen 2 liters min per [place] at night and prn  during day  . OSA (obstructive sleep apnea)    05/2009 sleep study - refuses CPAP  . Osteoarthritis   . RLS (restless legs syndrome)   . Shortness of breath    chronic  . Stasis dermatitis   . Type II or unspecified type diabetes mellitus without mention of complication, not stated as uncontrolled    insulin dep    Past Surgical History:  Procedure Laterality Date  . BILIARY STENT PLACEMENT N/A 05/06/2016   Procedure: BILIARY STENT PLACEMENT;  Surgeon: Gatha Mayer, MD;  Location: [place];  Service: Endoscopy;  Laterality: N/A;  . CHOLECYSTECTOMY  1997  . COLONOSCOPY N/A 12/03/2012   Procedure: COLONOSCOPY;  Surgeon: Lafayette Dragon, MD;  Location: [facility];  Service: Endoscopy;  Laterality: N/A;  . ENDOSCOPIC RETROGRADE CHOLANGIOPANCREATOGRAPHY (ERCP) WITH PROPOFOL N/A 05/06/2016   Procedure: ENDOSCOPIC RETROGRADE CHOLANGIOPANCREATOGRAPHY (ERCP) WITH PROPOFOL;  Surgeon: Gatha Mayer, MD;  Location: [place];  Service: Endoscopy;  Laterality: N/A;  . ERCP N/A 03/18/2016   Procedure: ENDOSCOPIC RETROGRADE CHOLANGIOPANCREATOGRAPHY (ERCP);  Surgeon: Irene Shipper, MD;  Location: [facility];  Service: Endoscopy;  Laterality: N/A;  . EUS N/A 04/28/2016   Procedure: UPPER ENDOSCOPIC ULTRASOUND (EUS) LINEAR;  Surgeon: [name], MD;  Location: [facility];  Service: Endoscopy;  Laterality: N/A;  . IR GENERIC HISTORICAL  05/07/2016   IR BILIARY DRAIN PLACEMENT WITH CHOLANGIOGRAM 05/07/2016 Jacqulynn Cadet, MD [facility]  . TONSILLECTOMY  1970  . TUBAL LIGATION  1980  . UMBILICAL HERNIA REPAIR  1995  . uterine polyp removal  2008    Social History:  reports that she has never smoked. She has never used smokeless tobacco. She reports that she does not drink alcohol or use drugs.  Allergies:  Allergies  Allergen Reactions  . Cymbalta [Duloxetine Hcl] Other (See Comments)    Restless leg syndrome  . Statins Other (See Comments)    Statin drugs cause muscle pain /  "muscle damage"--was told by MD not to take  . Sulfa Antibiotics Diarrhea  . Levemir [Insulin Detemir] Itching  . Morphine Other (See Comments)    GI upset and headaches  . Zinc Swelling and Rash    Medications Prior to Admission  Medication Sig Dispense Refill  . aspirin EC 81 MG tablet Take 1 tablet (81 mg total) by mouth daily.    . carvedilol (COREG) 25 MG tablet TAKE 1 TABLET BY MOUTH TWICE DAILY WITH FOOD 60 tablet 5  . Cholecalciferol (VITAMIN D3) 2000 units capsule Take 1 capsule (2,000 Units total) by mouth daily. 90 capsule 3  . citalopram (CELEXA) 20 MG tablet TAKE 1 TABLET (20 MG TOTAL) BY MOUTH DAILY. 30 tablet 1  . DULoxetine (CYMBALTA) 60 MG capsule Take 1 capsule (60 mg total) by mouth daily. 90 capsule 1  . ezetimibe (ZETIA) 10 MG tablet TAKE ONE TABLET BY MOUTH DAILY 90 tablet 0  . gabapentin (NEURONTIN) 300 MG capsule TAKE TWO CAPSULES BY MOUTH FOUR TIMES A DAY 240 capsule 1  . hydrALAZINE (APRESOLINE) 25 MG tablet TAKE 1 TABLET (25 MG TOTAL) BY MOUTH 3 (THREE) TIMES DAILY. 90 tablet 1  . HYDROcodone-acetaminophen (NORCO) 10-325 MG tablet Take 1 tablet by mouth every 6 (six) hours as needed. 90 tablet 0  . insulin aspart (NOVOLOG) 100 UNIT/ML injection Inject 0-20 Units into the skin 3 (three) times daily with meals. . CBG 70 - 120: 0 units CBG 121 - 150: 3 units CBG 151 - 200: 4 units CBG 201 - 250: 7 units CBG 251 - 300: 11 units CBG 301 - 350: 15 units CBG 351 - 400: 20 units    . insulin glargine (LANTUS) 100 UNIT/ML injection Inject 0.3 mLs (30 Units total) into the skin daily.    [name] LYRICA 200 MG capsule TAKE ONE CAPSULE BY MOUTH TWICE A DAY 60 capsule 0  . meloxicam (MOBIC) 7.5 MG tablet TAKE ONE TABLET BY MOUTH DAILY 30 tablet 0  . metFORMIN (GLUCOPHAGE-XR) 500 MG 24 hr tablet Take 1,000 mg by mouth daily with breakfast.     . metolazone (ZAROXOLYN) 2.5 MG tablet Take 1 tablet (2.5 mg total) by mouth 2 (two) times a week. 15 tablet 6  . rOPINIRole (REQUIP XL)  4 MG 24 hr tablet TAKE 1 TABLET (4 MG TOTAL) BY MOUTH AT BEDTIME. 30 tablet 0  . spironolactone (ALDACTONE) 25 MG tablet TAKE ONE TABLET EACH DAY 30 tablet 11  . torsemide (DEMADEX) 100 MG tablet Take 1 tablet (100 mg total) by mouth daily.    . vitamin B-12 (CYANOCOBALAMIN) 500 MCG tablet Take 500 mcg by mouth daily. Reported on 09/09/2015      Blood pressure (!) 132/52, pulse (!) 55, temperature 97.8 F (  36.6 C), temperature source Oral, resp. rate 20, height 4' 10"  (1.473 m), weight 246 lb 12.8 oz (111.9 kg), SpO2 91 %.  Physical Exam  Constitutional: She is well-developed, well-nourished, and in no distress. Vital signs are normal. No distress.  Somnolent and sleepy but will arouse. Not very verbal   HENT:  Head: Normocephalic and atraumatic.  Nose: Nose normal.  Eyes: Conjunctivae are normal. Right eye exhibits no discharge. Left eye exhibits no discharge. Scleral icterus is present.  Neck: Normal range of motion. Neck supple.  Cardiovascular: Normal rate, regular rhythm, normal heart sounds and intact distal pulses.  Exam reveals no gallop and no friction rub.   No murmur heard. Pulses:      Radial pulses are 2+ on the right side, and 2+ on the left side.       Posterior tibial pulses are 2+ on the right side, and 2+ on the left side.  Pulmonary/Chest: Effort normal and breath sounds normal. No respiratory distress. She has no wheezes. She has no rhonchi. She has no rales.  Abdominal: Soft. Bowel sounds are normal. She exhibits no distension and no mass. There is generalized tenderness. There is no rebound and no guarding.  Generalized abdominal tenderness worse in the epigastric region and RUQ. Drain in right side of abdomen with bilious drainage in bag  Musculoskeletal: Normal range of motion. She exhibits edema (1+ pitting of BLE). She exhibits no deformity.  Neurological: She is alert.  Oriented to person but not place or time  Skin: Skin is warm and dry. No rash noted. She is  not diaphoretic.  Jaundice   Nursing note and vitals reviewed.   Results for orders placed or performed during the hospital encounter of 05/04/16 (from the past 48 hour(s))  Glucose, capillary     Status: Abnormal   Collection Time: 05/07/16  5:11 PM  Result Value Ref Range   Glucose-Capillary 218 (H) 65 - 99 mg/dL  Glucose, capillary     Status: Abnormal   Collection Time: 05/07/16  9:22 PM  Result Value Ref Range   Glucose-Capillary 264 (H) 65 - 99 mg/dL  Glucose, capillary     Status: Abnormal   Collection Time: 05/08/16 12:04 AM  Result Value Ref Range   Glucose-Capillary 262 (H) 65 - 99 mg/dL   Comment 1 Notify RN    Comment 2 Document in Chart   Glucose, capillary     Status: Abnormal   Collection Time: 05/08/16  5:14 AM  Result Value Ref Range   Glucose-Capillary 199 (H) 65 - 99 mg/dL  Comprehensive metabolic panel     Status: Abnormal   Collection Time: 05/08/16  5:35 AM  Result Value Ref Range   Sodium 134 (L) 135 - 145 mmol/L   Potassium 3.6 3.5 - 5.1 mmol/L    Comment: DELTA CHECK NOTED   Chloride 96 (L) 101 - 111 mmol/L   CO2 32 22 - 32 mmol/L   Glucose, Bld 198 (H) 65 - 99 mg/dL   BUN 19 6 - 20 mg/dL   Creatinine, Ser 0.97 0.44 - 1.00 mg/dL   Calcium 8.0 (L) 8.9 - 10.3 mg/dL   Total Protein 5.2 (L) 6.5 - 8.1 g/dL   Albumin 1.5 (L) 3.5 - 5.0 g/dL   AST 112 (H) 15 - 41 U/L   ALT 53 14 - 54 U/L   Alkaline Phosphatase 745 (H) 38 - 126 U/L   Total Bilirubin 9.0 (H) 0.3 - 1.2 mg/dL   GFR  calc non Af Amer 58 (L) >60 mL/min   GFR calc Af Amer >60 >60 mL/min    Comment: (NOTE) The eGFR has been calculated using the CKD EPI equation. This calculation has not been validated in all clinical situations. eGFR's persistently <60 mL/min signify possible Chronic Kidney Disease.    Anion gap 6 5 - 15  CBC     Status: Abnormal   Collection Time: 05/08/16  5:35 AM  Result Value Ref Range   WBC 7.2 4.0 - 10.5 K/uL   RBC 3.05 (L) 3.87 - 5.11 MIL/uL   Hemoglobin 8.5 (L)  12.0 - 15.0 g/dL   HCT 27.1 (L) 36.0 - 46.0 %   MCV 88.9 78.0 - 100.0 fL   MCH 27.9 26.0 - 34.0 pg   MCHC 31.4 30.0 - 36.0 g/dL   RDW 16.6 (H) 11.5 - 15.5 %   Platelets 308 150 - 400 K/uL  Glucose, capillary     Status: Abnormal   Collection Time: 05/08/16  8:03 AM  Result Value Ref Range   Glucose-Capillary 184 (H) 65 - 99 mg/dL  Glucose, capillary     Status: Abnormal   Collection Time: 05/08/16 11:06 AM  Result Value Ref Range   Glucose-Capillary 170 (H) 65 - 99 mg/dL  Glucose, capillary     Status: Abnormal   Collection Time: 05/08/16  5:17 PM  Result Value Ref Range   Glucose-Capillary 204 (H) 65 - 99 mg/dL  Glucose, capillary     Status: Abnormal   Collection Time: 05/08/16  8:04 PM  Result Value Ref Range   Glucose-Capillary 217 (H) 65 - 99 mg/dL   Comment 1 Notify RN    Comment 2 Document in Chart   Glucose, capillary     Status: Abnormal   Collection Time: 05/09/16 12:16 AM  Result Value Ref Range   Glucose-Capillary 208 (H) 65 - 99 mg/dL  CBC     Status: Abnormal   Collection Time: 05/09/16  4:19 AM  Result Value Ref Range   WBC 6.4 4.0 - 10.5 K/uL   RBC 3.02 (L) 3.87 - 5.11 MIL/uL   Hemoglobin 8.3 (L) 12.0 - 15.0 g/dL   HCT 26.8 (L) 36.0 - 46.0 %   MCV 88.7 78.0 - 100.0 fL   MCH 27.5 26.0 - 34.0 pg   MCHC 31.0 30.0 - 36.0 g/dL   RDW 16.3 (H) 11.5 - 15.5 %   Platelets 325 150 - 400 K/uL  Comprehensive metabolic panel     Status: Abnormal   Collection Time: 05/09/16  4:19 AM  Result Value Ref Range   Sodium 137 135 - 145 mmol/L   Potassium 3.6 3.5 - 5.1 mmol/L   Chloride 96 (L) 101 - 111 mmol/L   CO2 32 22 - 32 mmol/L   Glucose, Bld 173 (H) 65 - 99 mg/dL   BUN 20 6 - 20 mg/dL   Creatinine, Ser 0.98 0.44 - 1.00 mg/dL   Calcium 8.4 (L) 8.9 - 10.3 mg/dL   Total Protein 5.3 (L) 6.5 - 8.1 g/dL   Albumin 1.5 (L) 3.5 - 5.0 g/dL   AST 72 (H) 15 - 41 U/L   ALT 44 14 - 54 U/L   Alkaline Phosphatase 673 (H) 38 - 126 U/L   Total Bilirubin 6.3 (H) 0.3 - 1.2 mg/dL    GFR calc non Af Amer 57 (L) >60 mL/min   GFR calc Af Amer >60 >60 mL/min    Comment: (NOTE) The eGFR has  been calculated using the CKD EPI equation. This calculation has not been validated in all clinical situations. eGFR's persistently <60 mL/min signify possible Chronic Kidney Disease.    Anion gap 9 5 - 15  Glucose, capillary     Status: Abnormal   Collection Time: 05/09/16  5:20 AM  Result Value Ref Range   Glucose-Capillary 170 (H) 65 - 99 mg/dL  Glucose, capillary     Status: Abnormal   Collection Time: 05/09/16  8:08 AM  Result Value Ref Range   Glucose-Capillary 159 (H) 65 - 99 mg/dL  Glucose, capillary     Status: Abnormal   Collection Time: 05/09/16  2:49 PM  Result Value Ref Range   Glucose-Capillary 209 (H) 65 - 99 mg/dL   *Note: Due to a large number of results and/or encounters for the requested time period, some results have not been displayed. A complete set of results can be found in Results Review.   No results found.    Assessment/Plan  Pancreatic cancer with obstructive jaundice, cholangitis, transaminitis, hyperbilirubinemia  - ERCP on 3/16, notable for existing plastic stent clogged - and removed w/ snare - distal CBD stricture - IR consulted for drainage per primary - Percutaneous transhepatic cholangiogram and placement of internal/external biliary drain. Aspirated purulent bile sent for culture --> done 3/17 by IR team  - no surgery this admission. Nutrition consult, PT/OT consult pending  Kalman Drape, [facility] Surgery 05/09/2016, 3:50 PM Pager: [phone] Consults: [phone] Mon-Fri 7:00 am-4:30 pm Sat-Sun 7:00 am-11:30 am

## 2016-05-09 NOTE — Progress Notes (Signed)
Referring Physician(s): Dr Garwin Brothers   Supervising Physician: Aletta Edouard  Patient Status:  [facility] - In-pt  Chief Complaint:  Pancreatic mass Biliary obstruction  Subjective:  Int/Ext Biliary drain placed 3/17 + jaundice but less per family at bedside TB trending down All LFTs trending down Groggy Responds but very sleepy  Allergies: Cymbalta [duloxetine hcl]; Statins; Sulfa antibiotics; Levemir [insulin detemir]; Morphine; and Zinc  Medications: Prior to Admission medications   Medication Sig Start Date End Date Taking? Authorizing Provider  aspirin EC 81 MG tablet Take 1 tablet (81 mg total) by mouth daily. 07/08/14  Yes Rowe Clack, MD  carvedilol (COREG) 25 MG tablet TAKE 1 TABLET BY MOUTH TWICE DAILY WITH FOOD 01/20/16  Yes Jolaine Artist, MD  Cholecalciferol (VITAMIN D3) 2000 units capsule Take 1 capsule (2,000 Units total) by mouth daily. 06/17/15  Yes Janith Lima, MD  citalopram (CELEXA) 20 MG tablet TAKE 1 TABLET (20 MG TOTAL) BY MOUTH DAILY. 01/27/16  Yes [name], MD  DULoxetine (CYMBALTA) 60 MG capsule Take 1 capsule (60 mg total) by mouth daily. 09/25/15  Yes [name], MD  ezetimibe (ZETIA) 10 MG tablet TAKE ONE TABLET BY MOUTH DAILY 02/03/16  Yes [name], MD  gabapentin (NEURONTIN) 300 MG capsule TAKE TWO CAPSULES BY MOUTH FOUR TIMES A DAY 01/19/16  Yes [name], MD  hydrALAZINE (APRESOLINE) 25 MG tablet TAKE 1 TABLET (25 MG TOTAL) BY MOUTH 3 (THREE) TIMES DAILY. 02/16/16  Yes Jolaine Artist, MD  HYDROcodone-acetaminophen (NORCO) 10-325 MG tablet Take 1 tablet by mouth every 6 (six) hours as needed. 04/08/16  Yes [name], MD  insulin aspart (NOVOLOG) 100 UNIT/ML injection Inject 0-20 Units into the skin 3 (three) times daily with meals. . CBG 70 - 120: 0 units CBG 121 - 150: 3 units CBG 151 - 200: 4 units CBG 201 - 250: 7 units CBG 251 - 300: 11 units CBG 301 - 350: 15 units CBG  351 - 400: 20 units 03/21/16  Yes Mariel Aloe, MD  insulin glargine (LANTUS) 100 UNIT/ML injection Inject 0.3 mLs (30 Units total) into the skin daily. 03/22/16  Yes Mariel Aloe, MD  LYRICA 200 MG capsule TAKE ONE CAPSULE BY MOUTH TWICE A DAY 04/07/16  Yes [name], MD  meloxicam (MOBIC) 7.5 MG tablet TAKE ONE TABLET BY MOUTH DAILY 03/11/16  Yes [name], MD  metFORMIN (GLUCOPHAGE-XR) 500 MG 24 hr tablet Take 1,000 mg by mouth daily with breakfast.    Yes Historical Provider, MD  metolazone (ZAROXOLYN) 2.5 MG tablet Take 1 tablet (2.5 mg total) by mouth 2 (two) times a week. 12/17/15  Yes Doretta Friar, PA-C  rOPINIRole (REQUIP XL) 4 MG 24 hr tablet TAKE 1 TABLET (4 MG TOTAL) BY MOUTH AT BEDTIME. 02/23/16  Yes [name], MD  spironolactone (ALDACTONE) 25 MG tablet TAKE ONE TABLET EACH DAY 04/21/16  Yes Larey Dresser, MD  torsemide (DEMADEX) 100 MG tablet Take 1 tablet (100 mg total) by mouth daily. 03/21/16  Yes Mariel Aloe, MD  vitamin B-12 (CYANOCOBALAMIN) 500 MCG tablet Take 500 mcg by mouth daily. Reported on 09/09/2015   Yes Historical Provider, MD     Vital Signs: BP (!) 120/45 (BP Location: Right Arm)   Pulse (!) 52   Temp 99.2 F (37.3 C) (Oral)   Resp 18   Ht 4\' 10"  (1.473 m)   Wt 246 lb  12.8 oz (111.9 kg)   SpO2 97%   BMI 51.58 kg/m   Physical Exam  Abdominal: Soft. Bowel sounds are normal. There is tenderness.  Tender at site  Skin: Skin is warm.  Skin site is clean and dry Tender to touch No redness No bleeding No sign of infection OP 100 cc in bag; 100cc last 24 hrs per chart +Bile  Nursing note and vitals reviewed.   Imaging: Ct Chest Wo Contrast  Result Date: 05/07/2016 CLINICAL DATA:  Pancreatic mass, recent bile ducts stent placement EXAM: CT CHEST WITHOUT CONTRAST TECHNIQUE: Multidetector CT imaging of the chest was performed following the standard protocol without IV contrast. COMPARISON:  CT abdomen from  05/05/2016 FINDINGS: Cardiovascular: Aortic arch and left anterior descending coronary artery atherosclerotic calcification. Calcified mitral valve. Mild cardiomegaly. Mediastinum/Nodes: Small paratracheal lymph nodes are present better not appreciably pathologically enlarged. A right hilar node measures 1 cm in short axis on image 56/ 3, borderline prominent. Lungs/Pleura: Scattered bandlike subsegmental atelectasis in both lungs. Centrilobular emphysema. Increased medial atelectasis in the left lower lobe compared to the prior CT abdomen. Small right and trace left pleural effusions with some fluid tracking in the major fissures. Pleural effusions appear new compared to 05/05/16. Upper Abdomen: High density material in the pancreatic duct, probably contrast, only partially included on today' s exam. Musculoskeletal: Subacute healing fracture the right seventh rib laterally. Old healed right third rib fracture. Thoracic spondylosis and mild thoracic kyphosis. IMPRESSION: 1. New small right and trace left pleural effusions. Mild cardiomegaly. 2. Borderline enlarged right hilar lymph node at 1 cm in short axis. 3. No definite findings of metastatic disease to the chest. 4. Centrilobular emphysema. 5. High density in the visualized part of the dorsal pancreatic duct, probably contrast medium. 6. Subacute healing fracture of the right seventh rib laterally. 7. Atherosclerosis, including the left anterior descending coronary artery. Electronically Signed   By: Van Clines M.D.   On: 05/07/2016 12:03   Dg Ercp  Result Date: 05/06/2016 CLINICAL DATA:  Adenocarcinoma of the pancreas, cholangitis, jaundice and previous endoscopic biliary stent placement. EXAM: ERCP TECHNIQUE: Multiple spot images obtained with the fluoroscopic device and submitted for interpretation post-procedure. COMPARISON:  CT of the abdomen on 05/05/2016 as well as multiple additional prior imaging studies. FINDINGS: Imaging during ERCP  demonstrates cannulation of the common bile duct and removal of an indwelling biliary stent. A guidewire was able to be passed through a biliary stricture. The stent was not able to be replaced. IMPRESSION: Imaging demonstrating removal of endoscopic common bile duct stent. The stent was not able to be replaced. These images were submitted for radiologic interpretation only. Please see the procedural report for the amount of contrast and the fluoroscopy time utilized. Electronically Signed   By: Aletta Edouard M.D.   On: 05/06/2016 14:47   Ir Biliary Drain Placement With Cholangiogram  Result Date: 05/07/2016 INDICATION: 71 year old female with malignant obstructed jaundice and cholangitis. She recently had a plastic biliary drain placed earlier this month but presents with an occluded drain and inability to pass through the distal common bile duct stricture on ERCP. She requires percutaneous transhepatic cholangiogram and drain placement. EXAM: PRIOR PERCUTANEOUS TRANSHEPATIC CHOLANGIOGRAM AND PLACEMENT OF AN INTERNAL/ EXTERNAL BILIARY DRAINAGE CATHETER MEDICATIONS: Patient is currently receiving 3.375 g Zosyn IV. No additional antibiotic given ANESTHESIA/SEDATION: Moderate (conscious) sedation was employed during this procedure. A total of Versed 2.5 mg and Fentanyl 100 mcg was administered intravenously. Moderate Sedation Time: 20 minutes. The patient's  level of consciousness and vital signs were monitored continuously by radiology nursing throughout the procedure under my direct supervision. FLUOROSCOPY TIME:  Fluoroscopy Time: 3 minutes 18 seconds (62 mGy). COMPLICATIONS: None immediate. PROCEDURE: Informed written consent was obtained from the patient after a thorough discussion of the procedural risks, benefits and alternatives. All questions were addressed. Maximal Sterile Barrier Technique was utilized including caps, mask, sterile gowns, sterile gloves, sterile drape, hand hygiene and skin antiseptic.  A timeout was performed prior to the initiation of the procedure. The right upper quadrant was interrogated with ultrasound. Interval development of progressive biliary ductal dilatation. Dilated bile ducts can be identified sonographically. A suitable posterior division all duct in the right liver was identified. The overlying skin was anesthetized with 1% lidocaine. Under real-time sonographic guidance, the bile duct was punctured with a 21 gauge Accustick needle in a single pass. There is immediate return of turbid bile through the needle hub. A gentle hand injection of contrast material was performed in a limited cholangiogram was obtained. There is extensive intra hepatic biliary ductal dilatation. The 0.018 wire was advanced into the biliary system centrally. The Accustick needle was then exchanged for the Accustick sheath which was advanced into the common hepatic duct. Additional contrast injection was performed. There is a high-grade lung segment stenosis of the common bile duct. A small wisp of contrast material passes through the stricture and into the duodenum. An Amplatz wire was successfully advanced through the stricture and into the duodenum. The skin tract was then dilated to 10 [place] and a Cook 10 French internal/external biliary drainage catheter was advanced over the wire and positioned with the locking loop in the duodenum. The tube was connected to bag drainage and secured to the skin with 0 Prolene suture. A sterile bandage was applied. The patient tolerated the procedure well. IMPRESSION: 1. 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 2. Successful placement of a 10 French internal/external biliary drainage catheter. Aspirated bile is frankly purulent. A sample was sent for culture. PLAN: 1. Maintain biliary drain to bag drainage until  symptoms of cholangitis have resolved and bilirubin has nearly normalized. 2. It would be preferable to cap the biliary drain prior to discharge if the above can be accomplished. 3. Follow electrolytes and replete as needed. 4. Return to interventional radiology in 2-4 weeks for biliary stent placement. Signed, Criselda Peaches, MD [facility] Radiology Electronically Signed   By: Jacqulynn Cadet M.D.   On: 05/07/2016 11:22    Labs:  CBC:  Recent Labs  05/06/16 0638 05/07/16 0808 05/08/16 0535 05/09/16 0419  WBC 7.9 5.9 7.2 6.4  HGB 10.0* 8.8* 8.5* 8.3*  HCT 31.4* 28.2* 27.1* 26.8*  PLT 327 275 308 325    COAGS:  Recent Labs  03/15/16 1116 05/05/16 0023  INR 0.94 0.97  APTT 30 32    BMP:  Recent Labs  05/06/16 0638 05/07/16 0808 05/08/16 0535 05/09/16 0419  NA 138 138 134* 137  K 2.9* 2.9* 3.6 3.6  CL 91* 97* 96* 96*  CO2 34* 34* 32 32  GLUCOSE 212* 201* 198* 173*  BUN 13 15 19 20   CALCIUM 8.0* 7.7* 8.0* 8.4*  CREATININE 1.00 0.97 0.97 0.98  GFRNONAA 56* 58* 58* 57*  GFRAA >60 >60 >60 >60    LIVER FUNCTION TESTS:  Recent Labs  05/06/16 7672 05/07/16  6015 05/08/16 0535 05/09/16 0419  BILITOT 6.7* 8.6* 9.0* 6.3*  AST 130* 113* 112* 72*  ALT 59* 54 53 44  ALKPHOS 712* 677* 745* 673*  PROT 5.6* 5.3* 5.2* 5.3*  ALBUMIN 1.9* 1.6* 1.5* 1.5*    Assessment and Plan:  I/E biliary drain placed 3/17 Will follow Plan for metal stent when GI MD may feel ready---call IR Will follow labs  Electronically Signed: Kalliope Riesen A 05/09/2016, 11:20 AM   I spent a total of 15 Minutes at the the patient's bedside AND on the patient's hospital floor or unit, greater than 50% of which was counseling/coordinating care for Biliary drain

## 2016-05-10 ENCOUNTER — Ambulatory Visit: Payer: PPO | Admitting: Hematology

## 2016-05-10 ENCOUNTER — Other Ambulatory Visit: Payer: PPO

## 2016-05-10 DIAGNOSIS — K838 Other specified diseases of biliary tract: Secondary | ICD-10-CM

## 2016-05-10 LAB — CULTURE, BLOOD (ROUTINE X 2)
Culture: NO GROWTH
Culture: NO GROWTH

## 2016-05-10 LAB — COMPREHENSIVE METABOLIC PANEL
ALBUMIN: 1.6 g/dL — AB (ref 3.5–5.0)
ALT: 36 U/L (ref 14–54)
ANION GAP: 11 (ref 5–15)
AST: 46 U/L — ABNORMAL HIGH (ref 15–41)
Alkaline Phosphatase: 666 U/L — ABNORMAL HIGH (ref 38–126)
BUN: 11 mg/dL (ref 6–20)
CHLORIDE: 95 mmol/L — AB (ref 101–111)
CO2: 34 mmol/L — AB (ref 22–32)
Calcium: 8.9 mg/dL (ref 8.9–10.3)
Creatinine, Ser: 0.84 mg/dL (ref 0.44–1.00)
GFR calc non Af Amer: >60 mL/min (ref >60)
Glucose, Bld: 145 mg/dL — ABNORMAL HIGH (ref 65–99)
Potassium: 3.4 mmol/L — ABNORMAL LOW (ref 3.5–5.1)
SODIUM: 140 mmol/L (ref 135–145)
Total Bilirubin: 4.5 mg/dL — ABNORMAL HIGH (ref 0.3–1.2)
Total Protein: 5.8 g/dL — ABNORMAL LOW (ref 6.5–8.1)

## 2016-05-10 LAB — GLUCOSE, CAPILLARY
GLUCOSE-CAPILLARY: 167 mg/dL — AB (ref 65–99)
GLUCOSE-CAPILLARY: 250 mg/dL — AB (ref 65–99)
Glucose-Capillary: 120 mg/dL — ABNORMAL HIGH (ref 65–99)
Glucose-Capillary: 135 mg/dL — ABNORMAL HIGH (ref 65–99)
Glucose-Capillary: 155 mg/dL — ABNORMAL HIGH (ref 65–99)
Glucose-Capillary: 167 mg/dL — ABNORMAL HIGH (ref 65–99)
Glucose-Capillary: 206 mg/dL — ABNORMAL HIGH (ref 65–99)

## 2016-05-10 LAB — CBC
HCT: 27 % — ABNORMAL LOW (ref 36.0–46.0)
HEMOGLOBIN: 8.4 g/dL — AB (ref 12.0–15.0)
MCH: 27.3 pg (ref 26.0–34.0)
MCHC: 31.1 g/dL (ref 30.0–36.0)
MCV: 87.7 fL (ref 78.0–100.0)
Platelets: 374 10*3/uL (ref 150–400)
RBC: 3.08 MIL/uL — AB (ref 3.87–5.11)
RDW: 16 % — ABNORMAL HIGH (ref 11.5–15.5)
WBC: 6.5 10*3/uL (ref 4.0–10.5)

## 2016-05-10 MED ORDER — LACTULOSE 10 GM/15ML PO SOLN
20.0000 g | Freq: Every day | ORAL | Status: DC
  Administered 2016-05-10 – 2016-05-13 (×4): 20 g via ORAL
  Filled 2016-05-10 (×4): qty 30

## 2016-05-10 MED ORDER — SODIUM CHLORIDE 0.9 % IV SOLN
INTRAVENOUS | Status: DC
  Administered 2016-05-10 – 2016-05-11 (×4): via INTRAVENOUS
  Administered 2016-05-12: 100 mL/h via INTRAVENOUS

## 2016-05-10 MED ORDER — PRO-STAT SUGAR FREE PO LIQD
30.0000 mL | Freq: Two times a day (BID) | ORAL | Status: DC
  Administered 2016-05-10 – 2016-05-11 (×2): 30 mL via ORAL
  Filled 2016-05-10 (×5): qty 30

## 2016-05-10 NOTE — Progress Notes (Signed)
Daily Rounding Note  05/10/2016, 9:47 AM  LOS: 5 days   SUBJECTIVE:   Chief complaint: no complaints today.      Note only put out 625 ml, 600 ml, 200 ml, 500 ml in previous 3 days. 75 ml biliary drain output yesterday  Last BM was 3/16.  PO intake not getting consistently recorded.  No po intake recorded for yesterday.    OBJECTIVE:         Vital signs in last 24 hours:    Temp:  [97.7 F (36.5 C)-98.1 F (36.7 C)] 98.1 F (36.7 C) (03/20 0505) Pulse Rate:  [50-55] 50 (03/20 0505) Resp:  [17-20] 17 (03/20 0505) BP: (119-132)/(36-52) 119/36 (03/20 0505) SpO2:  [91 %-100 %] 100 % (03/20 0505) Weight:  [111.6 kg (246 lb 0.5 oz)] 111.6 kg (246 lb 0.5 oz) (03/20 0505) Last BM Date: 05/06/16 Filed Weights   05/08/16 1700 05/09/16 0459 05/10/16 0505  Weight: 111.7 kg (246 lb 4.1 oz) 111.9 kg (246 lb 12.8 oz) 111.6 kg (246 lb 0.5 oz)   General: sallow, pale, lingering jaundice.  Looks worse than yesterday.   Heart: RRR Chest: clear bil.  Breathing non-labored Abdomen: soft, active BS, mild-moderate tenderness over perc drain in RUQ.  Clear, green, bilious looking drainage in perc drain.   Extremities: edema in UE.   Neuro/Psych:  More lethargic.  Inconsistently follows simple commands.  Oriented to [place], "18", self.  Noticeable decline in mental status, more lethargic/somnolent.    Intake/Output from previous day: 03/19 0701 - 03/20 0700 In: -  Out: 575 [Urine:500; Drains:75]  Intake/Output this shift: No intake/output data recorded.  Lab Results:  Recent Labs  05/08/16 0535 05/09/16 0419 05/10/16 0533  WBC 7.2 6.4 6.5  HGB 8.5* 8.3* 8.4*  HCT 27.1* 26.8* 27.0*  PLT 308 325 374   BMET  Recent Labs  05/08/16 0535 05/09/16 0419 05/10/16 0533  NA 134* 137 140  K 3.6 3.6 3.4*  CL 96* 96* 95*  CO2 32 32 34*  GLUCOSE 198* 173* 145*  BUN 19 20 11   CREATININE 0.97 0.98 0.84  CALCIUM 8.0* 8.4* 8.9     LFT  Recent Labs  05/08/16 0535 05/09/16 0419 05/10/16 0533  PROT 5.2* 5.3* 5.8*  ALBUMIN 1.5* 1.5* 1.6*  AST 112* 72* 46*  ALT 53 44 36  ALKPHOS 745* 673* 666*  BILITOT 9.0* 6.3* 4.5*   PT/INR No results for input(s): LABPROT, INR in the last 72 hours. Hepatitis Panel No results for input(s): HEPBSAG, HCVAB, HEPAIGM, HEPBIGM in the last 72 hours.  Studies/Results: No results found.  Scheduled Meds: . aspirin EC  81 mg Oral Daily  . carvedilol  25 mg Oral BID WC  . citalopram  20 mg Oral Daily  . enoxaparin (LOVENOX) injection  40 mg Subcutaneous Q24H  . ezetimibe  10 mg Oral Daily  . furosemide  20 mg Oral Daily  . insulin aspart  0-9 Units Subcutaneous Q4H  . insulin glargine  8 Units Subcutaneous BID  . piperacillin-tazobactam (ZOSYN)  IV  3.375 g Intravenous Q8H  . pregabalin  200 mg Oral BID  . rOPINIRole  2 mg Oral BID   Continuous Infusions: PRN Meds:.HYDROcodone-acetaminophen, HYDROmorphone (DILAUDID) injection, ondansetron **OR** ondansetron (ZOFRAN) IV  ASSESMENT:   *  Obstructive jaundice late 02/2016.  1/26 ERCP with sphincterotomy and stent placement across malignant CBD stricture.   3/8 EUS.  Mass (adenocarcinoma) at uncinate.   3/15  admission with cholangitis, recurrent jaundice.  Abx inititated, Zosyn day 6.  3/16 ERCP.  Clogged stent removed, unable to pass wire and place new stent across severe malignant stricture.  3/17 s/p 43fr int/ext biliary drain, aspirated purulent bile is growing pan-sensitive Pantoea species and Enterococus Faecalis.   Remains jaundiced but LFTs steadily improving.  WBCs normalized. Bile is no longer purulent.    *  Mild elevation ammonia (51) level in non-cirrhotic.  Significantly less alert this AM.  CT head 3/15 unremarkable.   *  Protein cal malnutrition (PCM).  Albumin 1.5.  PO/meal intake not documented and, judging by this AMs tray, suspect very little PO.    *  Normocytic anemia.  Hgb stable. 11/2012  Colonoscopy with polypectomy Olevia Perches, screening).  Tubular adenoma removed   *  Oliguric without AKI, BUN/Creat normal.   *  Hypokalemia.   *  IDDM, historically poor control but recent glucoses improved.    *  Klebsiella UTI.  Sensitive to Zosyn, day 6.   *  Physical deconditioning.  Baseline several weeks ago was ambulatory with walker at home but able to cook, peform ADLs.      PLAN   *  Note abx sensitivity for Enterococcus only lists sensitivity to Ampicillin and Vanc, while Pantoea is pan-sensitive.  Should we add unasyn or vanc (note Klebsiella in urine is Ampicillin resistant but has completed 5 plus days of Zosyn)?   *  Asked staff to better document po intake.   Added NS at 100/hour.   *  Adding Lactulose 54ml/20gm daily.  Would stop for diarrhea  Azucena Freed  05/10/2016, 9:47 AM Pager: [phone]  GI ATTENDING  Interval history and data reviewed. Patient personally seen and examined. Husband in room. Agree with interval progress note as outlined. Liver tests improving. On antibiotics for cholangitis. Surgical note reviewed. At this point, recommend: 1. Complete course of antibiotics for bacterial cholangitis 2. Management of biliary drain including follow-up per IR 3. She may need some form of rehabilitation for her debilitation 4. Follow up thereafter with oncology. Poor overall prognosis given her general medical problems, and dilatation, and known pancreatic cancer with biliary obstruction  Will sign off. Available for questions or problems  Delanda Bulluck N. Geri [place]., M.D. [facility] Division of Gastroenterology

## 2016-05-10 NOTE — Evaluation (Signed)
Physical Therapy Evaluation Patient Details Name: Melissa Myers MRN: [id number] DOB: 10-17-1945 Today's Date: 05/10/2016   History of Present Illness  71 y.o.femalewho presented to the EDwith fever, abdominal pain, malaise, confusion, admitted with cholangitis, recurrent jaundice. On 3/16 had ERCP, clogged stent removed, unable to pass wire and place new stent across severe malignant stricture. 3/17 s/p int/ext biliary drain placed. PMH: diabetes, restless leg syndrome, neuropathy, HTN, CHF.  Clinical Impression  Pt admitted with above diagnosis. Pt currently with functional limitations due to the deficits listed below (see PT Problem List). Pt with limited activity tolerance at this time, requiring +2 assistance for bed mobility and transfers. Pt able to stand at EOB but unable to perform pivot to [facility]. Additional time spent on bed mobility due to using bed pan and changing sheets following. Based upon the patient's current mobility level, recommending SNF for further rehabilitation following acute stay. Spouse in agreement. Pt will benefit from skilled PT to increase their independence and safety with mobility.      Follow Up Recommendations SNF;Supervision/Assistance - 24 hour    Equipment Recommendations  None recommended by PT    Recommendations for Other Services       Precautions / Restrictions Precautions Precautions: Fall Restrictions Weight Bearing Restrictions: No      Mobility  Bed Mobility Overal bed mobility: Needs Assistance Bed Mobility: Rolling;Supine to Sit;Sit to Supine Rolling: Mod assist   Supine to sit: +2 for physical assistance;Max assist Sit to supine: +2 for physical assistance;Max assist      Transfers Overall transfer level: Needs assistance Equipment used: Rolling walker (2 wheeled) Transfers: Sit to/from Stand Sit to Stand: +2 physical assistance;Mod assist         General transfer comment: Pt able to stand at EOB but unable to move  LEs to take steps for transfer.   Ambulation/Gait                Stairs            Wheelchair Mobility    Modified Rankin (Stroke Patients Only)       Balance Overall balance assessment: Needs assistance Sitting-balance support: Bilateral upper extremity supported Sitting balance-Leahy Scale: Poor Sitting balance - Comments: max assist initially with strong posterior lean, improving to sitting with min guard for short periods of time.    Standing balance support: Bilateral upper extremity supported Standing balance-Leahy Scale: Poor Standing balance comment: using rw and physical assist                             Pertinent Vitals/Pain Pain Assessment: Faces Faces Pain Scale: Hurts even more Pain Location: all over Pain Descriptors / Indicators: Moaning;Grimacing Pain Intervention(s): Limited activity within patient's tolerance;Monitored during session;Repositioned    Home Living Family/patient expects to be discharged to:: Unsure Living Arrangements: Spouse/significant other Available Help at Discharge: Family;Available 24 hours/day Type of Home: House Home Access: Level entry     Home Layout: One level Home Equipment: Walker - 2 wheels;Wheelchair - manual;Bedside commode Additional Comments: details provided by spouse, pt unable to self report    Prior Function Level of Independence: Needs assistance   Gait / Transfers Assistance Needed: using walker for short distances around the house.   ADL's / Homemaking Assistance Needed: Assist with bathing/dressing.         Hand Dominance        Extremity/Trunk Assessment   Upper Extremity Assessment Upper Extremity Assessment:  Generalized weakness    Lower Extremity Assessment Lower Extremity Assessment: Generalized weakness    Cervical / Trunk Assessment Cervical / Trunk Assessment: Kyphotic  Communication   Communication: No difficulties  Cognition Arousal/Alertness:  Lethargic Behavior During Therapy: Flat affect Overall Cognitive Status: Impaired/Different from baseline Area of Impairment: Orientation;Following commands Orientation Level: Disoriented to;Place;Time     Following Commands: Follows one step commands inconsistently            General Comments      Exercises     Assessment/Plan    PT Assessment Patient needs continued PT services  PT Problem List Decreased strength;Decreased balance;Decreased activity tolerance;Decreased mobility       PT Treatment Interventions DME instruction;Gait training;Functional mobility training;Therapeutic activities;Therapeutic exercise;Balance training;Neuromuscular re-education;Patient/family education    PT Goals (Current goals can be found in the Care Plan section)  Acute Rehab PT Goals Patient Stated Goal: not expressed PT Goal Formulation: With patient/family Time For Goal Achievement: 05/24/16 Potential to Achieve Goals: Fair    Frequency Min 2X/week   Barriers to discharge        Co-evaluation               End of Session Equipment Utilized During Treatment: Gait belt;Oxygen Activity Tolerance: Patient limited by fatigue Patient left: in bed;with call bell/phone within reach;with family/visitor present;with nursing/sitter in room (nursing to turn on bed alarm (discussed)) Nurse Communication: Mobility status PT Visit Diagnosis: Unsteadiness on feet (R26.81);Muscle weakness (generalized) (M62.81)         Time: 7262-0355 PT Time Calculation (min) (ACUTE ONLY): 37 min   Charges:   PT Evaluation $PT Eval Moderate Complexity: 1 Procedure PT Treatments $Therapeutic Activity: 8-22 mins   PT G Codes:         Cassell Clement, PT, CSCS Pager [phone] Office [phone]  05/10/2016, 1:34 PM

## 2016-05-10 NOTE — Progress Notes (Signed)
Patient ID: Melissa Myers, female   DOB: Oct 27, 1945, 71 y.o.   MRN: [id number]    Referring Physician(s): Dr. Silvano Rusk  Supervising Physician: Markus Daft  Patient Status: [facility] - In-pt  Chief Complaint: Obstructive jaundice secondary to neoplasm  Subjective: Patient is being turned and worked with right now by PT.  She has no new complaints today.  Allergies: Cymbalta [duloxetine hcl]; Statins; Sulfa antibiotics; Levemir [insulin detemir]; Morphine; and Zinc  Medications: Prior to Admission medications   Medication Sig Start Date End Date Taking? Authorizing Provider  aspirin EC 81 MG tablet Take 1 tablet (81 mg total) by mouth daily. 07/08/14  Yes Rowe Clack, MD  carvedilol (COREG) 25 MG tablet TAKE 1 TABLET BY MOUTH TWICE DAILY WITH FOOD 01/20/16  Yes Jolaine Artist, MD  Cholecalciferol (VITAMIN D3) 2000 units capsule Take 1 capsule (2,000 Units total) by mouth daily. 06/17/15  Yes Janith Lima, MD  citalopram (CELEXA) 20 MG tablet TAKE 1 TABLET (20 MG TOTAL) BY MOUTH DAILY. 01/27/16  Yes [name], MD  DULoxetine (CYMBALTA) 60 MG capsule Take 1 capsule (60 mg total) by mouth daily. 09/25/15  Yes [name], MD  ezetimibe (ZETIA) 10 MG tablet TAKE ONE TABLET BY MOUTH DAILY 02/03/16  Yes [name], MD  gabapentin (NEURONTIN) 300 MG capsule TAKE TWO CAPSULES BY MOUTH FOUR TIMES A DAY 01/19/16  Yes [name], MD  hydrALAZINE (APRESOLINE) 25 MG tablet TAKE 1 TABLET (25 MG TOTAL) BY MOUTH 3 (THREE) TIMES DAILY. 02/16/16  Yes Jolaine Artist, MD  HYDROcodone-acetaminophen (NORCO) 10-325 MG tablet Take 1 tablet by mouth every 6 (six) hours as needed. 04/08/16  Yes [name], MD  insulin aspart (NOVOLOG) 100 UNIT/ML injection Inject 0-20 Units into the skin 3 (three) times daily with meals. . CBG 70 - 120: 0 units CBG 121 - 150: 3 units CBG 151 - 200: 4 units CBG 201 - 250: 7 units CBG 251 - 300: 11 units CBG 301 -  350: 15 units CBG 351 - 400: 20 units 03/21/16  Yes Mariel Aloe, MD  insulin glargine (LANTUS) 100 UNIT/ML injection Inject 0.3 mLs (30 Units total) into the skin daily. 03/22/16  Yes Mariel Aloe, MD  LYRICA 200 MG capsule TAKE ONE CAPSULE BY MOUTH TWICE A DAY 04/07/16  Yes [name], MD  meloxicam (MOBIC) 7.5 MG tablet TAKE ONE TABLET BY MOUTH DAILY 03/11/16  Yes [name], MD  metFORMIN (GLUCOPHAGE-XR) 500 MG 24 hr tablet Take 1,000 mg by mouth daily with breakfast.    Yes Historical Provider, MD  metolazone (ZAROXOLYN) 2.5 MG tablet Take 1 tablet (2.5 mg total) by mouth 2 (two) times a week. 12/17/15  Yes Dajae Friar, PA-C  rOPINIRole (REQUIP XL) 4 MG 24 hr tablet TAKE 1 TABLET (4 MG TOTAL) BY MOUTH AT BEDTIME. 02/23/16  Yes [name], MD  spironolactone (ALDACTONE) 25 MG tablet TAKE ONE TABLET EACH DAY 04/21/16  Yes Larey Dresser, MD  torsemide (DEMADEX) 100 MG tablet Take 1 tablet (100 mg total) by mouth daily. 03/21/16  Yes Mariel Aloe, MD  vitamin B-12 (CYANOCOBALAMIN) 500 MCG tablet Take 500 mcg by mouth daily. Reported on 09/09/2015   Yes Historical Provider, MD    Vital Signs: BP (!) 119/36 (BP Location: Right Arm)   Pulse (!) 50   Temp 98.1 F (36.7 C) (Oral)   Resp 17   Ht 4' 10" (1.473  m)   Wt 246 lb 0.5 oz (111.6 kg)   SpO2 100%   BMI 51.42 kg/m   Physical Exam: abd: soft, NT in RUQ, obese, PTC drain in place with drain site c/d/I.  Drain just emptied so no output currently.  75cc output yesterday  Imaging: Ct Chest Wo Contrast  Result Date: 05/07/2016 CLINICAL DATA:  Pancreatic mass, recent bile ducts stent placement EXAM: CT CHEST WITHOUT CONTRAST TECHNIQUE: Multidetector CT imaging of the chest was performed following the standard protocol without IV contrast. COMPARISON:  CT abdomen from 05/05/2016 FINDINGS: Cardiovascular: Aortic arch and left anterior descending coronary artery atherosclerotic calcification. Calcified  mitral valve. Mild cardiomegaly. Mediastinum/Nodes: Small paratracheal lymph nodes are present better not appreciably pathologically enlarged. A right hilar node measures 1 cm in short axis on image 56/ 3, borderline prominent. Lungs/Pleura: Scattered bandlike subsegmental atelectasis in both lungs. Centrilobular emphysema. Increased medial atelectasis in the left lower lobe compared to the prior CT abdomen. Small right and trace left pleural effusions with some fluid tracking in the major fissures. Pleural effusions appear new compared to 05/05/16. Upper Abdomen: High density material in the pancreatic duct, probably contrast, only partially included on today' s exam. Musculoskeletal: Subacute healing fracture the right seventh rib laterally. Old healed right third rib fracture. Thoracic spondylosis and mild thoracic kyphosis. IMPRESSION: 1. New small right and trace left pleural effusions. Mild cardiomegaly. 2. Borderline enlarged right hilar lymph node at 1 cm in short axis. 3. No definite findings of metastatic disease to the chest. 4. Centrilobular emphysema. 5. High density in the visualized part of the dorsal pancreatic duct, probably contrast medium. 6. Subacute healing fracture of the right seventh rib laterally. 7. Atherosclerosis, including the left anterior descending coronary artery. Electronically Signed   By: Van Clines M.D.   On: 05/07/2016 12:03   Dg Ercp  Result Date: 05/06/2016 CLINICAL DATA:  Adenocarcinoma of the pancreas, cholangitis, jaundice and previous endoscopic biliary stent placement. EXAM: ERCP TECHNIQUE: Multiple spot images obtained with the fluoroscopic device and submitted for interpretation post-procedure. COMPARISON:  CT of the abdomen on 05/05/2016 as well as multiple additional prior imaging studies. FINDINGS: Imaging during ERCP demonstrates cannulation of the common bile duct and removal of an indwelling biliary stent. A guidewire was able to be passed through a  biliary stricture. The stent was not able to be replaced. IMPRESSION: Imaging demonstrating removal of endoscopic common bile duct stent. The stent was not able to be replaced. These images were submitted for radiologic interpretation only. Please see the procedural report for the amount of contrast and the fluoroscopy time utilized. Electronically Signed   By: Aletta Edouard M.D.   On: 05/06/2016 14:47   Ir Biliary Drain Placement With Cholangiogram  Result Date: 05/07/2016 INDICATION: 71 year old female with malignant obstructed jaundice and cholangitis. She recently had a plastic biliary drain placed earlier this month but presents with an occluded drain and inability to pass through the distal common bile duct stricture on ERCP. She requires percutaneous transhepatic cholangiogram and drain placement. EXAM: PRIOR PERCUTANEOUS TRANSHEPATIC CHOLANGIOGRAM AND PLACEMENT OF AN INTERNAL/ EXTERNAL BILIARY DRAINAGE CATHETER MEDICATIONS: Patient is currently receiving 3.375 g Zosyn IV. No additional antibiotic given ANESTHESIA/SEDATION: Moderate (conscious) sedation was employed during this procedure. A total of Versed 2.5 mg and Fentanyl 100 mcg was administered intravenously. Moderate Sedation Time: 20 minutes. The patient's level of consciousness and vital signs were monitored continuously by radiology nursing throughout the procedure under my direct supervision. FLUOROSCOPY TIME:  Fluoroscopy Time:  3 minutes 18 seconds (62 mGy). COMPLICATIONS: None immediate. PROCEDURE: Informed written consent was obtained from the patient after a thorough discussion of the procedural risks, benefits and alternatives. All questions were addressed. Maximal Sterile Barrier Technique was utilized including caps, mask, sterile gowns, sterile gloves, sterile drape, hand hygiene and skin antiseptic. A timeout was performed prior to the initiation of the procedure. The right upper quadrant was interrogated with ultrasound. Interval  development of progressive biliary ductal dilatation. Dilated bile ducts can be identified sonographically. A suitable posterior division all duct in the right liver was identified. The overlying skin was anesthetized with 1% lidocaine. Under real-time sonographic guidance, the bile duct was punctured with a 21 gauge Accustick needle in a single pass. There is immediate return of turbid bile through the needle hub. A gentle hand injection of contrast material was performed in a limited cholangiogram was obtained. There is extensive intra hepatic biliary ductal dilatation. The 0.018 wire was advanced into the biliary system centrally. The Accustick needle was then exchanged for the Accustick sheath which was advanced into the common hepatic duct. Additional contrast injection was performed. There is a high-grade lung segment stenosis of the common bile duct. A small wisp of contrast material passes through the stricture and into the duodenum. An Amplatz wire was successfully advanced through the stricture and into the duodenum. The skin tract was then dilated to 10 French and a Cook 10 French internal/external biliary drainage catheter was advanced over the wire and positioned with the locking loop in the duodenum. The tube was connected to bag drainage and secured to the skin with 0 Prolene suture. A sterile bandage was applied. The patient tolerated the procedure well. IMPRESSION: 1. 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 2. Successful placement of a 10 French internal/external biliary drainage catheter. Aspirated bile is frankly purulent. A sample was sent for culture. PLAN: 1. Maintain biliary drain to bag drainage until symptoms of cholangitis have resolved and bilirubin has nearly normalized. 2. It would be preferable to cap the biliary drain prior to  discharge if the above can be accomplished. 3. Follow electrolytes and replete as needed. 4. Return to interventional radiology in 2-4 weeks for biliary stent placement. Signed, Heath K. McCullough, MD [facility] Electronically Signed   By: Heath  McCullough M.D.   On: 05/07/2016 11:22    Labs:  CBC:  Recent Labs  05/07/16 0808 05/08/16 0535 05/09/16 0419 05/10/16 0533  WBC 5.9 7.2 6.4 6.5  HGB 8.8* 8.5* 8.3* 8.4*  HCT 28.2* 27.1* 26.8* 27.0*  PLT 275 308 325 374    COAGS:  Recent Labs  03/15/16 1116 05/05/16 0023  INR 0.94 0.97  APTT 30 32    BMP:  Recent Labs  05/07/16 0808 05/08/16 0535 05/09/16 0419 05/10/16 0533  NA 138 134* 137 140  K 2.9* 3.6 3.6 3.4*  CL 97* 96* 96* 95*  CO2 34* 32 32 34*  GLUCOSE 201* 198* 173* 145*  BUN 15 19 20 11  CALCIUM 7.7* 8.0* 8.4* 8.9  CREATININE 0.97 0.97 0.98 0.84  GFRNONAA 58* 58* 57* >60  GFRAA >60 >60 >60 >60    LIVER FUNCTION TESTS:  Recent Labs  05/07/16 0808 05/08/16 0535 05/09/16 0419 05/10/16 0533  BILITOT 8.6* 9.0* 6.3* 4.5*  AST 113* 112* 72* 46*  ALT 54 53 44 36    ALKPHOS 677* 745* 673* 666*  PROT 5.3* 5.2* 5.3* 5.8*  ALBUMIN 1.6* 1.5* 1.5* 1.6*    Assessment and Plan: 1. Obstructive jaundice secondary to CBD obstruction from malignant neoplasm, s/p PTC drain placement -TB is continuing to trend down nicely.  Down to 4.5 today. -CX: PANTOEA SPECIES  ENTEROCOCCUS FAECALIS -initial recommendation was for possible bare metal stent to be placed in 1-2 weeks after PTC drain placement.  Electronically Signed: Henreitta Cea 05/10/2016, 12:45 PM   I spent a total of 15 Minutes at the the patient's bedside AND on the patient's hospital floor or unit, greater than 50% of which was counseling/coordinating care for obstructive neoplasm of CBD with obstructive jaundice

## 2016-05-10 NOTE — Progress Notes (Signed)
Patient ID: Melissa Myers, female   DOB: 1946-01-10, 71 y.o.   MRN: [id number]    PROGRESS NOTE  Melissa Myers  HUD:[id number] DOB: [birth year] DOA: 05/04/2016  PCP: [name], MD   Brief Narrative:  Melissa Myers is a 71 yo female with a history of IDDM with neuropathy, HFpEF, HTN, OSA on intermittent home oxygen, and recently diagnosed pancreatic mass s/p biliary stent 04/28/2016 who presented 3/14 with fever, abdominal pain and jaundice. She was diagnosed with cholangitis, recurrent jaundice, and zosyn was started. ERCP on 3/16 showed a clogged stent which was removed though new stent was not able to be placed across malignant stricture. On 3/17 a 10Fr int/ext biliary drain was placed with aspiration of purulent bile. Blood cultures remained negative, though bile culture grew pantoea species, enterococcus faecalis. Urine culture had also grown Klebsiella, sensitive to zosyn.   History includes:  - 2009 ERCP with 12 mm sphincterotomy due to jaundice, with ? filling defect but no stones on bile duct sweep.  - Admission 1/23 until 1/29 for obstructive jaundice, 03/18/16 ERCP with biliary stent placement across malignant-appearing distal CBD stricture. CA 19-9: 107 - EUS 04/28/16: stent in good position. 2.9 cm mass in pancreatic uncinate obstructing main PD and bile duct.  Assessment & Plan: Pancreatic cancer with obstructive jaundice, bacterial cholangitis: s/p removal of clogged stent 3/16 at ERCP with unsuccessful effort at restenting. Had perc drain placed 3/17 by IR. Bile culture grew pantoea and Enterococcus.  - Discussed with pharmacy and according to the [facility] guide and local antibiogram, zosyn will be sufficient for both in addition to Klebsiella UTI. Continue zosyn - Had appointment with oncology, Dr. Burr Medico today. She is aware of admission. - Surgery consulted regarding surgical options, I.e. Whipple: Her current functional and nutrition status makes her a poor  candidate.  - Monitoring bilirubin daily, trending downward.  - Due to overall poor prognosis, will have palliative care team get involved to discuss goals of care and recommendations on palliative care services. I have discussed this with the patient and her husband.   Toxic metabolic encephalopathy: With elevated ammonia. CT head unremarkable.  - GI started lactulose 3/20, will continue monitoring.   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 At inpatient goal. - Continue lantus and SSI                  Acute kidney injury: Prerenal, resolved. - Monitor, IVF's restarted with AMS limiting po intake.  Chronic HFpEF: Last echo 11/2015 with EF 65%, G1DD.  - Daily weights - IVF started by GI given poor po, will monitor I/O closely - Gave dose of Lasix 3/18 and 3/19  Bradycardia: Asymptomatic. - Will place hold parameters on beta blocker.   OSA - CPAP qHS (has been declined by pt)  Klebsiella UTI - Zosyn started 3/15, having likely completed treatment for UTI indication.  Hypokalemia: Resolved with repletion - Monitor  Hyponatremia - pre renal  - resolved with IVF  Morbid obesity BMI is 51.42kg/m. - May make malnutrition more difficult to assess by habitus  DVT prophylaxis:  Lovenox Code Status: Full  Family Communication: Husband at bedside Disposition Plan: Anticipate DC to SNF based on level of debility.   Consultants:   GI  IR  Surgery   Procedures:   1/26 ERCP with sphincterotomy and stent placement across malignant CBD stricture.    3/8 EUS.  Mass (adenocarcinoma) at uncinate.    3/15 admission with  cholangitis, recurrent jaundice. Zosyn started  3/16 ERCP.  Clogged stent removed, unable to pass wire and place new stent across severe malignant stricture.   3/17 Dr Laurence Ferrari placed 94fr int/ext biliary drain, aspirated purulent bile. Blood cx's negative. Bile culture grew pantoea species, enterococcus  faecalis.    Antimicrobials:   Zosyn 3/15 >  Subjective: Husband says she appears more drowsy today, but she has no complaints. Abdominal pain controlled.   Objective: Vitals:   05/09/16 1435 05/09/16 2108 05/10/16 0505 05/10/16 1413  BP: (!) 132/52 (!) 120/49 (!) 119/36 (!) 132/47  Pulse: (!) 55 (!) 50 (!) 50 (!) 48  Resp: 20 17 17 20   Temp: 97.8 F (36.6 C) 97.7 F (36.5 C) 98.1 F (36.7 C) 97.4 F (36.3 C)  TempSrc: Oral  Oral Oral  SpO2: 91% 100% 100% 98%  Weight:   111.6 kg (246 lb 0.5 oz)   Height:        Intake/Output Summary (Last 24 hours) at 05/10/16 1706 Last data filed at 05/10/16 0419  Gross per 24 hour  Intake                0 ml  Output              375 ml  Net             -375 ml   Filed Weights   05/08/16 1700 05/09/16 0459 05/10/16 0505  Weight: 111.7 kg (246 lb 4.1 oz) 111.9 kg (246 lb 12.8 oz) 111.6 kg (246 lb 0.5 oz)    Examination:  General exam: Morbidly obese 71yo F in no distress, drowsy, poorly rousable  Respiratory system: Respiratory effort normal. Diminished breath sounds at bases  Cardiovascular system: S1 & S2 heard, RRR. No JVD, murmurs, rubs, gallops or clicks. Trace bilateral pitting edema Gastrointestinal system: Abdomen is nondistended, Normal bowel sounds heard. Slightly tender in epigastric area. Right abdomen with perc drain draining dark green fluid. Skin: Diffusely jaundiced CNS: Drowsy, poorly rousable, not oriented. No focal deficits, but very diffusely weak.   Data Reviewed: I have personally reviewed following labs and imaging studies  CBC:  Recent Labs Lab 05/05/16 0023 05/06/16 7494 05/07/16 0808 05/08/16 0535 05/09/16 0419 05/10/16 0533  WBC 11.0* 7.9 5.9 7.2 6.4 6.5  NEUTROABS 8.1*  --   --   --   --   --   HGB 11.4* 10.0* 8.8* 8.5* 8.3* 8.4*  HCT 33.9* 31.4* 28.2* 27.1* 26.8* 27.0*  MCV 86.3 86.7 89.8 88.9 88.7 87.7  PLT 347 327 275 308 325 496   Basic Metabolic Panel:  Recent Labs Lab  05/06/16 0638 05/07/16 0808 05/08/16 0535 05/09/16 0419 05/10/16 0533  NA 138 138 134* 137 140  K 2.9* 2.9* 3.6 3.6 3.4*  CL 91* 97* 96* 96* 95*  CO2 34* 34* 32 32 34*  GLUCOSE 212* 201* 198* 173* 145*  BUN 13 15 19 20 11   CREATININE 1.00 0.97 0.97 0.98 0.84  CALCIUM 8.0* 7.7* 8.0* 8.4* 8.9   Liver Function Tests:  Recent Labs Lab 05/06/16 0638 05/07/16 0808 05/08/16 0535 05/09/16 0419 05/10/16 0533  AST 130* 113* 112* 72* 46*  ALT 59* 54 53 44 36  ALKPHOS 712* 677* 745* 673* 666*  BILITOT 6.7* 8.6* 9.0* 6.3* 4.5*  PROT 5.6* 5.3* 5.2* 5.3* 5.8*  ALBUMIN 1.9* 1.6* 1.5* 1.5* 1.6*    Recent Labs Lab 05/05/16 0023  LIPASE 27    Recent Labs Lab 05/05/16 0023  AMMONIA  51*   Coagulation Profile:  Recent Labs Lab 05/05/16 0023  INR 0.97   Cardiac Enzymes:  Recent Labs Lab 05/05/16 0023  TROPONINI <0.03   BNP (last 3 results)  Recent Labs  11/04/15 0956  PROBNP 20.0   CBG:  Recent Labs Lab 05/10/16 0029 05/10/16 0501 05/10/16 0806 05/10/16 1147 05/10/16 1644  GLUCAP 206* 155* 120* 135* 167*   Urine analysis:    Component Value Date/Time   COLORURINE YELLOW 05/05/2016 0219   APPEARANCEUR CLEAR 05/05/2016 0219   LABSPEC 1.007 05/05/2016 0219   PHURINE 5.0 05/05/2016 0219   GLUCOSEU >=500 (A) 05/05/2016 0219   GLUCOSEU NEGATIVE 11/04/2015 0956   HGBUR NEGATIVE 05/05/2016 0219   BILIRUBINUR NEGATIVE 05/05/2016 0219   BILIRUBINUR neg 08/17/2015 1547   KETONESUR NEGATIVE 05/05/2016 0219   PROTEINUR NEGATIVE 05/05/2016 0219   UROBILINOGEN 0.2 11/04/2015 0956   NITRITE POSITIVE (A) 05/05/2016 0219   LEUKOCYTESUR TRACE (A) 05/05/2016 0219   Recent Results (from the past 240 hour(s))  Urine culture     Status: Abnormal   Collection Time: 05/05/16  2:19 AM  Result Value Ref Range Status   Specimen Description URINE, CATHETERIZED  Final   Special Requests NONE  Final   Culture >=100,000 COLONIES/mL KLEBSIELLA PNEUMONIAE (A)  Final    Report Status 05/08/2016 FINAL  Final   Organism ID, Bacteria KLEBSIELLA PNEUMONIAE (A)  Final      Susceptibility   Klebsiella pneumoniae - MIC*    AMPICILLIN >=32 RESISTANT Resistant     CEFAZOLIN <=4 SENSITIVE Sensitive     CEFTRIAXONE <=1 SENSITIVE Sensitive     CIPROFLOXACIN <=0.25 SENSITIVE Sensitive     GENTAMICIN <=1 SENSITIVE Sensitive     IMIPENEM <=0.25 SENSITIVE Sensitive     NITROFURANTOIN 64 INTERMEDIATE Intermediate     TRIMETH/SULFA <=20 SENSITIVE Sensitive     AMPICILLIN/SULBACTAM >=32 RESISTANT Resistant     PIP/TAZO 16 SENSITIVE Sensitive     * >=100,000 COLONIES/mL KLEBSIELLA PNEUMONIAE  Culture, blood (Routine X 2) w Reflex to ID Panel     Status: None   Collection Time: 05/05/16  3:34 AM  Result Value Ref Range Status   Specimen Description BLOOD RIGHT ARM  Final   Special Requests AEROBIC BOTTLE ONLY 10ML ROCEPHIN  Final   Culture NO GROWTH 5 DAYS  Final   Report Status 05/10/2016 FINAL  Final  Culture, blood (Routine X 2) w Reflex to ID Panel     Status: None   Collection Time: 05/05/16  3:42 AM  Result Value Ref Range Status   Specimen Description BLOOD RIGHT HAND  Final   Special Requests IN PEDIATRIC BOTTLE 4ML ROCEPHIN  Final   Culture NO GROWTH 5 DAYS  Final   Report Status 05/10/2016 FINAL  Final  Body fluid culture     Status: Abnormal (Preliminary result)   Collection Time: 05/07/16 10:37 AM  Result Value Ref Range Status   Specimen Description FLUID  Final   Special Requests BILIARY DRAIN  Final   Gram Stain   Final    ABUNDANT WBC PRESENT, PREDOMINANTLY PMN NO ORGANISMS SEEN    Culture PANTOEA SPECIES ENTEROCOCCUS FAECALIS  (A)  Final   Report Status PENDING  Incomplete   Organism ID, Bacteria PANTOEA SPECIES  Final   Organism ID, Bacteria ENTEROCOCCUS FAECALIS  Final      Susceptibility   Enterococcus faecalis - MIC*    AMPICILLIN <=2 SENSITIVE Sensitive     VANCOMYCIN 1 SENSITIVE Sensitive  GENTAMICIN SYNERGY RESISTANT Resistant      * ENTEROCOCCUS FAECALIS   Pantoea species - MIC*    CEFAZOLIN <=4 SENSITIVE Sensitive     CEFEPIME <=1 SENSITIVE Sensitive     CEFTAZIDIME <=1 SENSITIVE Sensitive     CEFTRIAXONE <=1 SENSITIVE Sensitive     CIPROFLOXACIN <=0.25 SENSITIVE Sensitive     GENTAMICIN <=1 SENSITIVE Sensitive     IMIPENEM <=0.25 SENSITIVE Sensitive     TRIMETH/SULFA <=20 SENSITIVE Sensitive     PIP/TAZO <=4 SENSITIVE Sensitive     * PANTOEA SPECIES    Radiology Studies: No results found.    Scheduled Meds: . aspirin EC  81 mg Oral Daily  . carvedilol  25 mg Oral BID WC  . citalopram  20 mg Oral Daily  . enoxaparin (LOVENOX) injection  40 mg Subcutaneous Q24H  . ezetimibe  10 mg Oral Daily  . feeding supplement (PRO-STAT SUGAR FREE 64)  30 mL Oral BID  . furosemide  20 mg Oral Daily  . insulin aspart  0-9 Units Subcutaneous Q4H  . insulin glargine  8 Units Subcutaneous BID  . lactulose  20 g Oral Daily  . piperacillin-tazobactam (ZOSYN)  IV  3.375 g Intravenous Q8H  . pregabalin  200 mg Oral BID  . rOPINIRole  2 mg Oral BID   Continuous Infusions: . sodium chloride 100 mL/hr at 05/10/16 1116     LOS: 5 days   Time spent: 25 minutes   Vance Gather, MD [facility] Pager [phone]   If 7PM-7AM, please contact night-coverage [email or web address] Password [email or web address] 05/10/2016, 5:06 PM

## 2016-05-10 NOTE — Progress Notes (Signed)
S: no events overnight, no nausea or vomiting O: BP (!) 119/36 (BP Location: Right Arm)   Pulse (!) 50   Temp 98.1 F (36.7 C) (Oral)   Resp 17   Ht _0  (1.473 m)   Wt 111.6 kg (246 lb 0.5 oz)   SpO2 100%   BMI 51.42 kg/m  Gen: somnolent but arousable, jaundiced Neuro: AOx3 Ab: soft  A/P panc head mass, deconditioned, cholangitis s/p PTC drain -f/u Dr. Fritz Pickerel consult -no plans for surgical intervention during hospitalization, needs nutritional work before major resection -f/u with Dr. Barry Dienes after hospitalization

## 2016-05-10 NOTE — Progress Notes (Signed)
Nutrition Follow-up  DOCUMENTATION CODES:   Obesity unspecified  INTERVENTION:   -30 ml Prostat BID, each supplement provides 100 kcals and 15 grams protein -Magic Cup TID with meals -If pt remains with poor oral intake secondary to AMS, consider initiation of short term enteral nutrition support. Recommend:  Initiate Jevity 1.2 @ 25 ml/hr via NGT and increase by 10 ml every 12 hours to goal rate of 45 ml/hr.   30 ml Prostat BID.    Tube feeding regimen provides 1496 kcal (100% of needs), 90 grams of protein, and 872 ml of H2O.   NUTRITION DIAGNOSIS:   Inadequate oral intake related to lethargy/confusion as evidenced by meal completion < 25%.  GOAL:   Patient will meet greater than or equal to 90% of their needs  MONITOR:   PO intake, Supplement acceptance, Labs, Weight trends, Skin, I & O's  REASON FOR ASSESSMENT:   Consult Assessment of nutrition requirement/status  ASSESSMENT:   Melissa Myers is a 71 y.o. female with a past medical history significant for IDDM, HFpEF, HTN, and recent pancreatic mass followed by [facility] underwent biliary stent 04/28/16 who presents with fever, abdominal pain, malaise, confusion.  3/16- s/p ERCP, which revealed biliary stricture with malignancy and underwent biliary stent removal  3/17- s/p Percutaneous transhepatic cholangiogram and placement of a 54F internal/external biliary drain.   Aspirated purulent bile sent for culture.  Spoke with pt husband at bedside, who reports that pt's intake has been minimal secondary to lethargy. He reveals that pt consumed a few bites of chicken and applesauce last night and ate no breakfast this morning. Pt husband shares that pt has not been alert enough to consume adequate PO's. PTA pt husband reports that pt had a good appetite and rarely skipped meals (and if she did would usually snack during the day). Pt husband reports that pt had difficulty consuming adequate protein secondary to disliking  the taste of meat (he shares that she would often omit chicken pieces in chicken noodle soup and eat only vegetable and gravy when served pot roast). He shares "nutrition has always been an issue for her, because she likes to eat the wrong things".   Pt husband reports that pt has experienced some weight loss, but unable to recall quantity or duration for weight loss. He believes it is mostly intentional and ay be related to fluid changes.   Nutrition-Focused physical exam completed on 05/05/16. Findings are no fat depletion, no muscle depletion, and mild edema. Noted pt with jaundiced appearance.  Discussed ways to increase protein in diet. Reviewed alternate protein sources that did not include meat, including yogurt, ice cream, beans, and nut butters. Pt husband does not think pt is alert enough to consume much of an oral nutrition supplement, but is amenable to [employer], which RD will order. If pt remains with AMS, may need to consider short term enteral nutrition support until mental status improves.   Per surgical team notes, pt is a poor surgical candidate.   Albumin has a half-life of 21 days and is strongly affected by stress response and inflammatory process, therefore, do not expect to see an improvement in this lab value during acute hospitalization.  Labs reviewed: K: 3.4.    Diet Order:  Diet Carb Modified Fluid consistency: Thin; Room service appropriate? Yes  Skin:  Reviewed, no issues  Last BM:  05/06/16  Height:   Ht Readings from Last 1 Encounters:  05/04/16 4\' 10"  (1.473 m)  Weight:   Wt Readings from Last 1 Encounters:  05/10/16 246 lb 0.5 oz (111.6 kg)    Ideal Body Weight:  44.1 kg  BMI:  Body mass index is 51.42 kg/m.  Estimated Nutritional Needs:   Kcal:  1350-1550  Protein:  90-105 grams  Fluid:  >1.5 L  EDUCATION NEEDS:   Education needs addressed  Afreen Siebels A. Jimmye Norman, RD, LDN, CDE Pager: [phone] After hours Pager: [phone]

## 2016-05-11 DIAGNOSIS — E46 Unspecified protein-calorie malnutrition: Secondary | ICD-10-CM

## 2016-05-11 DIAGNOSIS — G4733 Obstructive sleep apnea (adult) (pediatric): Secondary | ICD-10-CM

## 2016-05-11 DIAGNOSIS — I5032 Chronic diastolic (congestive) heart failure: Secondary | ICD-10-CM

## 2016-05-11 DIAGNOSIS — C259 Malignant neoplasm of pancreas, unspecified: Secondary | ICD-10-CM

## 2016-05-11 DIAGNOSIS — E119 Type 2 diabetes mellitus without complications: Secondary | ICD-10-CM

## 2016-05-11 DIAGNOSIS — R630 Anorexia: Secondary | ICD-10-CM

## 2016-05-11 LAB — COMPREHENSIVE METABOLIC PANEL
ALBUMIN: 1.6 g/dL — AB (ref 3.5–5.0)
ALT: 30 U/L (ref 14–54)
AST: 36 U/L (ref 15–41)
Alkaline Phosphatase: 593 U/L — ABNORMAL HIGH (ref 38–126)
Anion gap: 9 (ref 5–15)
BILIRUBIN TOTAL: 3.2 mg/dL — AB (ref 0.3–1.2)
BUN: 7 mg/dL (ref 6–20)
CO2: 32 mmol/L (ref 22–32)
Calcium: 8.6 mg/dL — ABNORMAL LOW (ref 8.9–10.3)
Chloride: 101 mmol/L (ref 101–111)
Creatinine, Ser: 0.7 mg/dL (ref 0.44–1.00)
GFR calc Af Amer: >60 mL/min (ref >60)
GFR calc non Af Amer: >60 mL/min (ref >60)
GLUCOSE: 131 mg/dL — AB (ref 65–99)
POTASSIUM: 3.4 mmol/L — AB (ref 3.5–5.1)
SODIUM: 142 mmol/L (ref 135–145)
TOTAL PROTEIN: 5.1 g/dL — AB (ref 6.5–8.1)

## 2016-05-11 LAB — GLUCOSE, CAPILLARY
GLUCOSE-CAPILLARY: 134 mg/dL — AB (ref 65–99)
GLUCOSE-CAPILLARY: 172 mg/dL — AB (ref 65–99)
GLUCOSE-CAPILLARY: 180 mg/dL — AB (ref 65–99)
Glucose-Capillary: 140 mg/dL — ABNORMAL HIGH (ref 65–99)
Glucose-Capillary: 165 mg/dL — ABNORMAL HIGH (ref 65–99)
Glucose-Capillary: 215 mg/dL — ABNORMAL HIGH (ref 65–99)

## 2016-05-11 LAB — BODY FLUID CULTURE

## 2016-05-11 LAB — CBC
HEMATOCRIT: 27 % — AB (ref 36.0–46.0)
HEMOGLOBIN: 8.4 g/dL — AB (ref 12.0–15.0)
MCH: 27.8 pg (ref 26.0–34.0)
MCHC: 31.1 g/dL (ref 30.0–36.0)
MCV: 89.4 fL (ref 78.0–100.0)
Platelets: 410 10*3/uL — ABNORMAL HIGH (ref 150–400)
RBC: 3.02 MIL/uL — ABNORMAL LOW (ref 3.87–5.11)
RDW: 16.5 % — ABNORMAL HIGH (ref 11.5–15.5)
WBC: 6.9 10*3/uL (ref 4.0–10.5)

## 2016-05-11 MED ORDER — POTASSIUM CHLORIDE CRYS ER 20 MEQ PO TBCR
20.0000 meq | EXTENDED_RELEASE_TABLET | Freq: Once | ORAL | Status: AC
  Administered 2016-05-11: 20 meq via ORAL
  Filled 2016-05-11: qty 1

## 2016-05-11 NOTE — Evaluation (Signed)
Occupational Therapy Evaluation Patient Details Name: Melissa Myers MRN: [id number] DOB: 1945-07-19 Today's Date: 05/11/2016    History of Present Illness 71 y.o.femalewho presented to the EDwith fever, abdominal pain, malaise, confusion, admitted with cholangitis, recurrent jaundice. On 3/16 had ERCP, clogged stent removed, unable to pass wire and place new stent across severe malignant stricture. 3/17 s/p int/ext biliary drain placed. PMH: diabetes, restless leg syndrome, neuropathy, HTN, CHF.   Clinical Impression   Pt reports she was modified independent with ADL PTA. Currently pt requiring max assist for bed mobility and attempted sit to stand from EOB; pt with difficulty coming into full upright posture in standing due to bil knee and back pain. Pt would require min assist for seated UB ADL and max assist for LB ADL. Recommending SNF for follow up to maximize independence and safety with ADL and functional mobility prior to return home. Pt would benefit from continued skilled OT to address established goals.    Follow Up Recommendations  SNF;Supervision/Assistance - 24 hour    Equipment Recommendations  Other (comment) (TBD at next venue)    Recommendations for Other Services       Precautions / Restrictions Precautions Precautions: Fall Restrictions Weight Bearing Restrictions: No      Mobility Bed Mobility Overal bed mobility: Needs Assistance Bed Mobility: Supine to Sit;Sit to Supine     Supine to sit: Max assist;HOB elevated Sit to supine: Max assist   General bed mobility comments: Assist for LEs to EOB and trunk elevation to sitting. Assist to scoot hips out to EOB with use of bed pad.   Transfers                 General transfer comment: Attempted sit to stand from EOB with max assist. Pt able to clear bottom to scoot toward [facility] for repositioning but unable to acheive full upright position due to pain.    Balance Overall balance assessment:  Needs assistance Sitting-balance support: No upper extremity supported;Feet supported Sitting balance-Leahy Scale: Fair Sitting balance - Comments: Max assist initially, then min guard after a few minutes                                    ADL Overall ADL's : Needs assistance/impaired Eating/Feeding: Set up;Bed level   Grooming: Min guard;Sitting   Upper Body Bathing: Minimal assistance;Sitting   Lower Body Bathing: Maximal assistance;Sitting/lateral leans   Upper Body Dressing : Minimal assistance;Sitting   Lower Body Dressing: Maximal assistance;Sitting/lateral leans                 General ADL Comments: Attempted sit to stand from EOB with max assist; pt able to clear bottom to scoot toward [facility] for repositioning but unable to acheive full upright posture due to knee/back pain. Discussed post acute rehab with pt and husband, they are agreeable.     Vision         Perception     Praxis      Pertinent Vitals/Pain Pain Assessment: Faces Faces Pain Scale: Hurts even more Pain Location: bil knees, back Pain Descriptors / Indicators: Aching;Grimacing;Moaning Pain Intervention(s): Limited activity within patient's tolerance;Monitored during session     Hand Dominance     Extremity/Trunk Assessment Upper Extremity Assessment Upper Extremity Assessment: Generalized weakness   Lower Extremity Assessment Lower Extremity Assessment: Defer to PT evaluation   Cervical / Trunk Assessment Cervical / Trunk Assessment: Kyphotic  Communication Communication Communication: No difficulties   Cognition Arousal/Alertness: Awake/alert (initially lethargic but aroused with movement) Behavior During Therapy: WFL for tasks assessed/performed Overall Cognitive Status: Within Functional Limits for tasks assessed                     General Comments       Exercises       Shoulder Instructions      Home Living Family/patient expects to be  discharged to:: Skilled nursing facility Living Arrangements: Spouse/significant other                           Home Equipment: Walker - 2 wheels;Wheelchair - [employer];Shower seat          Prior Functioning/Environment Level of Independence: Needs assistance  Gait / Transfers Assistance Needed: using walker for short distances around the house.  ADL's / Homemaking Assistance Needed: pt reports she is independent with ADL; husband does grocery shopping            OT Problem List: Decreased strength;Decreased range of motion;Decreased activity tolerance;Impaired balance (sitting and/or standing);Decreased knowledge of use of DME or AE;Obesity;Pain      OT Treatment/Interventions: Self-care/ADL training;Therapeutic exercise;Energy conservation;DME and/or AE instruction;Therapeutic activities;Patient/family education;Balance training    OT Goals(Current goals can be found in the care plan section) Acute Rehab OT Goals Patient Stated Goal: get better OT Goal Formulation: With patient/family Time For Goal Achievement: 05/25/16 Potential to Achieve Goals: Good ADL Goals Pt Will Perform Grooming: with min guard assist;standing (x2 tasks) Pt Will Perform Lower Body Bathing: with min guard assist;sit to/from stand (with or without AE) Pt Will Perform Lower Body Dressing: with min guard assist;sit to/from stand (with or without AE) Pt Will Transfer to Toilet: with min assist;ambulating;bedside commode (over toilet)  OT Frequency: Min 2X/week   Barriers to D/C:            Co-evaluation              End of Session Equipment Utilized During Treatment: Oxygen  Activity Tolerance: Patient tolerated treatment well Patient left: in bed;with call bell/phone within reach;with bed alarm set;with family/visitor present  OT Visit Diagnosis: Other abnormalities of gait and mobility (R26.89);Muscle weakness (generalized) (M62.81)                ADL either  performed or assessed with clinical judgement  Time: 1137-1155 OT Time Calculation (min): 18 min Charges:  OT General Charges $OT Visit: 1 Procedure OT Evaluation $OT Eval Moderate Complexity: 1 Procedure G-Codes:     Anzal Bartnick A. Ulice Brilliant, M.S., OTR/L Pager: [place] 05/11/2016, 12:05 PM

## 2016-05-11 NOTE — Progress Notes (Signed)
[place]  Telephone:([phone]) [place]  DOB: 26-Nov-1945  MR#: [id number]  CSN#: [id number]    Requesting Physician: [facility] Dr. Bonner Puna  Patient Care Team: [name], MD as PCP - General (Internal Medicine) Rigoberto Noel, MD as Consulting Physician (Pulmonary Disease) Larey Dresser, MD as Consulting Physician (Cardiology) Megan Salon, MD as Consulting Physician (Gynecology) Kristeen Miss, MD as Consulting Physician (Neurosurgery) Earlie Server, MD (Orthopedic Surgery) Almedia Balls, MD (Orthopedic Surgery) Lafayette Dragon, MD (Inactive) (Gastroenterology) Renato Shin, MD (Endocrinology) Darleen Crocker, MD (Ophthalmology)  Reason for consult: Pancreatic cancer    Malignant neoplasm of head of pancreas [facility])   02/2016 Initial Diagnosis    Malignant neoplasm of head of pancreas [facility])      03/15/2016 - 03/21/2016 Hospital Admission    Presents with nausea, vomiting, and jaundice   Patient was found hyperbilirubinemic with abnormal abdominal CT suggesting obstructive physiology. Referred for admission and evaluation.      03/15/2016 Imaging    CT Abdomen Pelvis w Contrast 1. Cholecystectomy with borderline biliary duct dilatation. 2. Pancreatic atrophy with moderate pancreatic duct dilatation, followed to the level of the duodenum. 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 Considerations include otherwise occult stricture, periampullary lesion, or main duct intraductal papillary mucinous neoplasm. Given the combination of borderline biliary duct dilatation, moderate pancreatic duct dilatation, and the clinical history of jaundice, consider further evaluation with ERCP. If the patient is not a good ERCP candidate, MRCP with and without contrast would be another option. 3.  No acute process in the abdomen or pelvis. 4.  Tiny hiatal hernia. 5.  Aortic atherosclerosis.      03/16/2016 Imaging    MRI Abdomen 1. Hypoenhancing lesion in the uncinate process of the pancreas, better demonstrated by MR than on the recent CT scan. The dilated main pancreatic duct and dilated common bile duct abruptly terminate at the cranial margin of this lesion. MR imaging features are concerning for pancreatic adenocarcinoma. ERCP with endoscopic ultrasound would likely prove helpful to further evaluate. 2. No definite metastatic disease in the liver. No peripancreatic lymphadenopathy is identified. Of note, this exam is limited by marked motion degradation and small liver lesions could be obscured. CT scan earlier today demonstrated no peripancreatic lymphadenopathy.      03/18/2016 Procedure    ERCP 1. Malignant-appearing distal bile duct stricturestatus post ERCP with sphincterotomy and plastic biliary stent placement 2. Guidewire finding false lumen with subsequentinjection as described 3. Status post cholecystectomy  Recommendation: 1. Clear liquid diet 2. Unasyn 1.5 g every 6 hours 3. Standard post ERCP observation. Indomethacin suppositories given 4. Trend LFTs 5. Will need endoscopic ultrasound with biopsy with Dr. Ardis Hughs outpatient.      04/28/2016 Pathology Results    Endoscopic [place] and Biopsy FINE NEEDLE ASPIRATION, ENDOSCOPIC, PANCREAS UNCINATE (SPECIMEN 1 OF 1 COLLECTED 04/28/16): MALIGNANT CELLS CONSISTENT WITH ADENOCARCINOMA.      05/04/2016 -  Hospital Admission    Presents with fever, abdominal pain, malaise, confusion. She was treated for acute cholangitis.       05/05/2016 Imaging    CT Abdomen Pelvis w Contrast Interval placement of a bile duct stent with some decompression of the bile ducts. Mild residual biliary dilatation. Pancreatic ductal dilatation and atrophy. Known pancreatic mass lesion is not well depicted at CT. No evidence of bowel obstruction or inflammation.      05/05/2016 Imaging    CT Head w/o Contrast  05/05/16 IMPRESSION: No  acute intracranial pathology.      05/06/2016 Procedure    ERCP and Bilary stent placement  Imaging demonstrating removal of endoscopic common bile duct stent. The stent was not able to be replaced.       05/07/2016 Imaging    CT Chest wo Contrast  1. New small right and trace left pleural effusions. Mild cardiomegaly. 2. Borderline enlarged right hilar lymph node at 1 cm in short axis. 3. No definite findings of metastatic disease to the chest. 4. Centrilobular emphysema. 5. High density in the visualized part of the dorsal pancreatic duct, probably contrast medium. 6. Subacute healing fracture of the right seventh rib laterally. 7. Atherosclerosis, including the left anterior descending coronary artery.      05/07/2016 Procedure    On 05/07/16, percutaneous transhepatic cholangiogram and placement of a 29F internal/external biliary drain was performed by IR with aspirated purulent bile sent for culture. Bile culture grew pantoea species, enterococcus faecalis, viridans streptococcus.        History of present illness:  The patient presented to the ED on 03/15/16 due to weakness, hyperglycemia, jaundice, abdominal pain, mild confusion, and chronic diarrhea. Physical exam at the time revealed abdominal distension and diffuse tenderness. Labs found the patient hyperbilirubinemic. CT scan of the abdomen showed pancreatic atrophy with moderate pancreatic duct dilatation, followed to the level of the duodenum. 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 Considerations include otherwise occult stricture, periampullary lesion, or main duct intraductal papillary mucinous neoplasm. MRI of the abdomen the same day showed a 2.2 x 2.8 cm hypoenhancing lesion in the uncinate process of the pancreas, better demonstrated by MRI than on the recent CT scan. The dilated main pancreatic duct and dilated common bile duct abruptly terminate at the cranial margin of this lesion. MRI features are concerning for  pancreatic adenocarcinoma.CA 19-9 on 03/17/16 was 107; elevated.  The patient underwent ERCP, with biliary sphincterotomy and biliary stent placement by Dr. Henrene Pastor 03/18/16. The patient was subsequently discharged on 03/21/16.  On 03/27/16, the patient presented to the ED on 03/29/16 for flu like symptoms. She was Influenza A positive. She was given fluids and discharged with Tamiflu. She presented back to the ED the following day for worsening symptoms. She was started on antibiotics for possible developing pneumonia giving a productive cough.  Given enough time to recover from her hospitalization and the flu, the patient underwent biopsy of the pancreatic mass by Dr. Ardis Hughs on 04/28/16 revealing adenocarcinoma.  Since then, the patient presented to the ED on 05/04/16 for a headaches, hyperglycemia, confusion, abdominal pain, and nausea. Given presentation of fever/confusion, she was admitted to the hospital.  CT of the head on 05/05/16 was negative. CT of the abd/pelvis on 05/05/16 showed interval placement of a bile duct stent with some decompression of the bile ducts, mild residual biliary dilatation, and pancreatic ductal dilatation and atrophy.  She was diagnosed with cholangitis and recurrent jaundice. Zosyn was started. ERCP on 05/06/16 showed a clogged stent which was removed, though a new stent was not able to be placed across a malignant biliary stricture. On 05/07/16, percutaneous transhepatic cholangiogram and placement of a 29F internal/external biliary drain was performed by IR with aspirated purulent bile sent for culture. Blood cultures remained negative, though bile culture grew pantoea species, enterococcus faecalis, viridans streptococcus. Urine culture had also grown Klebsiella pneumoniae, sensitive to Zosyn.  I see the patient today in an inpatient setting to discuss treatment options for the management of her pancreatic adenocarcinoma.  I met patient, her husband and son in her room. According to  patient's family, she used to be independent, does self care and housework, walks with a walker. Due to fatigue, anorexia, she has not been able to perform her daily activities in the past 4-6 weeks, and has spent most time in bed and chair for the past few weeks.    MEDICAL HISTORY:  Past Medical History:  Diagnosis Date  . Anemia   . Cellulitis    LOWER EXTREMITIES  . Chronic diarrhea    a/w nausea - felt related to IBS  . Deaf    left side only  . Diastolic CHF ([place])   . Disc degeneration, lumbar   . GERD (gastroesophageal reflux disease)   . Hyperlipidemia    hx rhabdo on statins  . Hypertension   . Neuropathy (HCC)    feet, toes and fingers  . On home oxygen therapy    uses oxygen 2 liters min per [place] at night and prn during day  . OSA (obstructive sleep apnea)    05/2009 sleep study - refuses CPAP  . Osteoarthritis   . RLS (restless legs syndrome)   . Shortness of breath    chronic  . Stasis dermatitis   . Type II or unspecified type diabetes mellitus without mention of complication, not stated as uncontrolled    insulin dep    SURGICAL HISTORY: Past Surgical History:  Procedure Laterality Date  . BILIARY STENT PLACEMENT N/A 05/06/2016   Procedure: BILIARY STENT PLACEMENT;  Surgeon: Gatha Mayer, MD;  Location: [place];  Service: Endoscopy;  Laterality: N/A;  . CHOLECYSTECTOMY  1997  . COLONOSCOPY N/A 12/03/2012   Procedure: COLONOSCOPY;  Surgeon: Lafayette Dragon, MD;  Location: [facility];  Service: Endoscopy;  Laterality: N/A;  . ENDOSCOPIC RETROGRADE CHOLANGIOPANCREATOGRAPHY (ERCP) WITH PROPOFOL N/A 05/06/2016   Procedure: ENDOSCOPIC RETROGRADE CHOLANGIOPANCREATOGRAPHY (ERCP) WITH PROPOFOL;  Surgeon: Gatha Mayer, MD;  Location: [place];  Service: Endoscopy;  Laterality: N/A;  . ERCP N/A 03/18/2016   Procedure: ENDOSCOPIC RETROGRADE CHOLANGIOPANCREATOGRAPHY (ERCP);  Surgeon: Irene Shipper, MD;  Location: [facility];  Service: Endoscopy;  Laterality:  N/A;  . EUS N/A 04/28/2016   Procedure: UPPER ENDOSCOPIC ULTRASOUND (EUS) LINEAR;  Surgeon: [name], MD;  Location: [facility];  Service: Endoscopy;  Laterality: N/A;  . IR GENERIC HISTORICAL  05/07/2016   IR BILIARY DRAIN PLACEMENT WITH CHOLANGIOGRAM 05/07/2016 Jacqulynn Cadet, MD [facility]-INTER[facility] RAD  . TONSILLECTOMY  1970  . TUBAL LIGATION  1980  . UMBILICAL HERNIA REPAIR  1995  . uterine polyp removal  2008    SOCIAL HISTORY: Social History   Social History  . Marital status: Married    Spouse name: N/A  . Number of children: N/A  . Years of education: N/A   Occupational History  . Not on file.   Social History Main Topics  . Smoking status: Never Smoker  . Smokeless tobacco: Never Used  . Alcohol use No  . Drug use: No  . Sexual activity: No   Other Topics Concern  . Not on file   Social History Narrative   Lives with spouse and mother. Retired Network engineer, now housewife    FAMILY HISTORY: Family History  Problem Relation Age of Onset  . Heart disease Mother   . Heart attack Mother 36  . Heart disease Father   . Heart attack Father 21  . Heart disease      family history  . Stomach  cancer Paternal Grandmother   . Lung cancer Paternal Grandfather   . CVA      several aunts  . Heart attack      several aunts and an uncle  . Colon cancer Neg Hx     ALLERGIES:  is allergic to cymbalta [duloxetine hcl]; statins; sulfa antibiotics; levemir [insulin detemir]; morphine; and zinc.  MEDICATIONS:  Current Facility-Administered Medications  Medication Dose Route Frequency Provider Last Rate Last Dose  . 0.9 %  sodium chloride infusion   Intravenous Continuous Patrecia Pour, MD 100 mL/hr at 05/11/16 1812    . aspirin EC tablet 81 mg  81 mg Oral Daily [name], MD   81 mg at 05/11/16 0842  . carvedilol (COREG) tablet 25 mg  25 mg Oral BID WC Patrecia Pour, MD   25 mg at 05/11/16 1614  . citalopram (CELEXA) tablet 20 mg  20 mg Oral Daily [name], MD   20 mg at 05/11/16 0842  . enoxaparin (LOVENOX) injection 40 mg  40 mg Subcutaneous Q24H Docia Barrier, PA   40 mg at 05/11/16 0532  . ezetimibe (ZETIA) tablet 10 mg  10 mg Oral Daily [name], MD   10 mg at 05/11/16 0841  . feeding supplement (PRO-STAT SUGAR FREE 64) liquid 30 mL  30 mL Oral BID Patrecia Pour, MD   30 mL at 05/10/16 1435  . HYDROcodone-acetaminophen (NORCO) 10-325 MG per tablet 1 tablet  1 tablet Oral Q6H PRN [name], MD   1 tablet at 05/08/16 0553  . insulin aspart (novoLOG) injection 0-9 Units  0-9 Units Subcutaneous Q4H Theodis Blaze, MD   3 Units at 05/11/16 2018  . insulin glargine (LANTUS) injection 8 Units  8 Units Subcutaneous BID Theodis Blaze, MD   8 Units at 05/11/16 1000  . lactulose (CHRONULAC) 10 GM/15ML solution 20 g  20 g Oral Daily Vena Rua, PA-C   20 g at 05/11/16 0841  . ondansetron (ZOFRAN) tablet 4 mg  4 mg Oral Q6H PRN [name], MD   4 mg at 05/07/16 1946   Or  . ondansetron (ZOFRAN) injection 4 mg  4 mg Intravenous Q6H PRN [name], MD   4 mg at 05/06/16 0114  . piperacillin-tazobactam (ZOSYN) IVPB 3.375 g  3.375 g Intravenous Q8H Veronda P Bryk, RPH   3.375 g at 05/11/16 1713  . pregabalin (LYRICA) capsule 200 mg  200 mg Oral BID [name], MD   200 mg at 05/11/16 0842  . rOPINIRole (REQUIP) tablet 2 mg  2 mg Oral BID Theodis Blaze, MD   2 mg at 05/11/16 0511    REVIEW OF SYSTEMS:   Constitutional: Denies fevers, chills or abnormal night sweats Eyes: Denies blurriness of vision, double vision or watery eyes Ears, nose, mouth, throat, and face: Denies mucositis or sore throat Respiratory: Denies cough, dyspnea or wheezes Cardiovascular: Denies palpitation, chest discomfort or lower extremity swelling Gastrointestinal:  Denies nausea, heartburn or change in bowel habits Skin: Denies abnormal skin rashes Lymphatics: Denies new lymphadenopathy or easy  bruising Neurological:Denies numbness, tingling or new weaknesses Behavioral/Psych: Mood is stable, no new changes  All other systems were reviewed with the patient and are negative.  PHYSICAL EXAMINATION: ECOG PERFORMANCE STATUS: 4 - Bedbound  Vitals:   05/11/16 0529 05/11/16 1431  BP: (!) 149/52 (!) 131/55  Pulse: (!) 51 (!) 50  Resp: 18 20  Temp:  97.6 F (36.4 C) 98.4 F (36.9 C)   Filed Weights   05/09/16 0459 05/10/16 0505 05/11/16 0529  Weight: 246 lb 12.8 oz (111.9 kg) 246 lb 0.5 oz (111.6 kg) 245 lb 9.5 oz (111.4 kg)    GENERAL:alert, no distress and comfortable SKIN: skin color, texture, turgor are normal, no rashes or significant lesions EYES: normal, conjunctiva are pink and non-injected, sclera clear OROPHARYNX:no exudate, no erythema and lips, buccal mucosa, and tongue normal  NECK: supple, thyroid normal size, non-tender, without nodularity LYMPH:  no palpable lymphadenopathy in the cervical, axillary or inguinal LUNGS: clear to auscultation and percussion with normal breathing effort HEART: regular rate & rhythm and no murmurs and no lower extremity edema ABDOMEN:abdomen soft, non-tender and normal bowel sounds Musculoskeletal:no cyanosis of digits and no clubbing  PSYCH: alert & oriented x 3 with fluent speech NEURO: no focal motor/sensory deficits  LABORATORY DATA:  I have reviewed the data as listed Lab Results  Component Value Date   WBC 6.9 05/11/2016   HGB 8.4 (L) 05/11/2016   HCT 27.0 (L) 05/11/2016   MCV 89.4 05/11/2016   PLT 410 (H) 05/11/2016    Recent Labs  03/16/16 2151  05/09/16 0419 05/10/16 0533 05/11/16 0719  NA  --   < > 137 140 142  K  --   < > 3.6 3.4* 3.4*  CL  --   < > 96* 95* 101  CO2  --   < > 32 34* 32  GLUCOSE  --   < > 173* 145* 131*  BUN  --   < > 20 11 7   CREATININE  --   < > 0.98 0.84 0.70  CALCIUM  --   < > 8.4* 8.9 8.6*  GFRNONAA  --   < > 57* >60 >60  GFRAA  --   < > >60 >60 >60  PROT 5.9*  < > 5.3* 5.8* 5.1*   ALBUMIN 2.7*  < > 1.5* 1.6* 1.6*  AST 654*  < > 72* 46* 36  ALT 299*  < > 44 36 30  ALKPHOS 457*  < > 673* 666* 593*  BILITOT 13.2*  < > 6.3* 4.5* 3.2*  BILIDIR 9.9*  --   --   --   --   IBILI 3.3*  --   --   --   --   < > = values in this interval not displayed.  RADIOGRAPHIC STUDIES: I have personally reviewed the radiological images as listed and agreed with the findings in the report. Ct Head Wo Contrast  Result Date: 05/05/2016 CLINICAL DATA:  71 year old female with headache and confusion. EXAM: CT HEAD WITHOUT CONTRAST TECHNIQUE: Contiguous axial images were obtained from the base of the skull through the vertex without intravenous contrast. COMPARISON:  Head CT dated 10/22/2014 FINDINGS: Brain: The ventricles and sulci appropriate in size for patient's age. Minimal periventricular and deep white matter chronic microvascular ischemic changes noted. There is no acute intracranial hemorrhage. No mass effect or midline shift noted. There is no extra-axial fluid collection. Vascular: No hyperdense vessel or unexpected calcification. Skull: Normal. Negative for fracture or focal lesion. Sinuses/Orbits: No acute finding. Other: None. IMPRESSION: No acute intracranial pathology. Electronically Signed   By: Anner Crete M.D.   On: 05/05/2016 01:21   Ct Chest Wo Contrast  Result Date: 05/07/2016 CLINICAL DATA:  Pancreatic mass, recent bile ducts stent placement EXAM: CT CHEST WITHOUT CONTRAST TECHNIQUE: Multidetector CT imaging of the chest was performed following  the standard protocol without IV contrast. COMPARISON:  CT abdomen from 05/05/2016 FINDINGS: Cardiovascular: Aortic arch and left anterior descending coronary artery atherosclerotic calcification. Calcified mitral valve. Mild cardiomegaly. Mediastinum/Nodes: Small paratracheal lymph nodes are present better not appreciably pathologically enlarged. A right hilar node measures 1 cm in short axis on image 56/ 3, borderline prominent.  Lungs/Pleura: Scattered bandlike subsegmental atelectasis in both lungs. Centrilobular emphysema. Increased medial atelectasis in the left lower lobe compared to the prior CT abdomen. Small right and trace left pleural effusions with some fluid tracking in the major fissures. Pleural effusions appear new compared to 05/05/16. Upper Abdomen: High density material in the pancreatic duct, probably contrast, only partially included on today' s exam. Musculoskeletal: Subacute healing fracture the right seventh rib laterally. Old healed right third rib fracture. Thoracic spondylosis and mild thoracic kyphosis. IMPRESSION: 1. New small right and trace left pleural effusions. Mild cardiomegaly. 2. Borderline enlarged right hilar lymph node at 1 cm in short axis. 3. No definite findings of metastatic disease to the chest. 4. Centrilobular emphysema. 5. High density in the visualized part of the dorsal pancreatic duct, probably contrast medium. 6. Subacute healing fracture of the right seventh rib laterally. 7. Atherosclerosis, including the left anterior descending coronary artery. Electronically Signed   By: Van Clines M.D.   On: 05/07/2016 12:03   Ct Abdomen Pelvis W Contrast  Result Date: 05/05/2016 CLINICAL DATA:  Epigastric pain. Hx diabetes, GERD, lumbar disc degeneration, chronic diarrhea, uterine polyp removal, umbilical hernia, tubal ligation, and cholecystectomy. EXAM: CT ABDOMEN AND PELVIS WITH CONTRAST TECHNIQUE: Multidetector CT imaging of the abdomen and pelvis was performed using the standard protocol following bolus administration of intravenous contrast. CONTRAST:  142m ISOVUE-300 IOPAMIDOL (ISOVUE-300) INJECTION 61% COMPARISON:  ERCP 03/18/2016.  MRI 03/16/2016.  CT 03/15/2016. FINDINGS: Lower chest: Scattered emphysematous changes in the lung bases. Fibrosis or atelectasis in the lung bases. Calcification in the mitral valve annulus and coronary arteries. Hepatobiliary: Since the previous  study, there is interval placement of a bile duct stent. Mild residual bile duct dilatation is less prominent than on prior study. Surgical absence of the gallbladder. No focal liver lesions. Pancreas: Diffuse pancreatic ductal dilatation with pancreatic parenchymal atrophy. Mass lesion demonstrated in the head of the pancreas on previous MRI is not well visualized at CT. No inflammatory stranding. Spleen: Focal lesion in the spleen measuring about 6 mm diameter is unchanged since previous study. This probably represents a small cyst. Additional subcentimeter lesion in the upper spleen is also unchanged. Adrenals/Urinary Tract: Adrenal glands are unremarkable. Kidneys are normal, without renal calculi, focal lesion, or hydronephrosis. Bladder is unremarkable. Stomach/Bowel: Stomach and small bowel are mostly decompressed. No small bowel distention or inflammatory infiltration. Colon is diffusely stool-filled without abnormal distention or inflammation. Scattered colonic diverticula. Appendix is not identified. Vascular/Lymphatic: Aortic atherosclerosis. No enlarged abdominal or pelvic lymph nodes. Reproductive: Uterus and bilateral adnexa are unremarkable. Other: Small left inguinal hernia containing fat. No free air or free fluid in the abdomen. Musculoskeletal: Degenerative changes in the spine. No destructive bone lesions. IMPRESSION: Interval placement of a bile duct stent with some decompression of the bile ducts. Mild residual biliary dilatation. Pancreatic ductal dilatation and atrophy. Known pancreatic mass lesion is not well depicted at CT. No evidence of bowel obstruction or inflammation. Electronically Signed   By: WLucienne CapersM.D.   On: 05/05/2016 04:37   Dg Chest Port 1 View  Result Date: 05/05/2016 CLINICAL DATA:  Confusion, hyperglycemia, and headache tonight EXAM:  PORTABLE CHEST 1 VIEW COMPARISON:  03/28/2016 FINDINGS: Shallow inspiration. The heart size and mediastinal contours are within  normal limits. Both lungs are clear. The visualized skeletal structures are unremarkable. IMPRESSION: No active disease. Electronically Signed   By: Lucienne Capers M.D.   On: 05/05/2016 00:04   Dg Ercp  Result Date: 05/06/2016 CLINICAL DATA:  Adenocarcinoma of the pancreas, cholangitis, jaundice and previous endoscopic biliary stent placement. EXAM: ERCP TECHNIQUE: Multiple spot images obtained with the fluoroscopic device and submitted for interpretation post-procedure. COMPARISON:  CT of the abdomen on 05/05/2016 as well as multiple additional prior imaging studies. FINDINGS: Imaging during ERCP demonstrates cannulation of the common bile duct and removal of an indwelling biliary stent. A guidewire was able to be passed through a biliary stricture. The stent was not able to be replaced. IMPRESSION: Imaging demonstrating removal of endoscopic common bile duct stent. The stent was not able to be replaced. These images were submitted for radiologic interpretation only. Please see the procedural report for the amount of contrast and the fluoroscopy time utilized. Electronically Signed   By: Aletta Edouard M.D.   On: 05/06/2016 14:47   Ir Biliary Drain Placement With Cholangiogram  Result Date: 05/07/2016 INDICATION: 71 year old female with malignant obstructed jaundice and cholangitis. She recently had a plastic biliary drain placed earlier this month but presents with an occluded drain and inability to pass through the distal common bile duct stricture on ERCP. She requires percutaneous transhepatic cholangiogram and drain placement. EXAM: PRIOR PERCUTANEOUS TRANSHEPATIC CHOLANGIOGRAM AND PLACEMENT OF AN INTERNAL/ EXTERNAL BILIARY DRAINAGE CATHETER MEDICATIONS: Patient is currently receiving 3.375 g Zosyn IV. No additional antibiotic given ANESTHESIA/SEDATION: Moderate (conscious) sedation was employed during this procedure. A total of Versed 2.5 mg and Fentanyl 100 mcg was administered intravenously.  Moderate Sedation Time: 20 minutes. The patient's level of consciousness and vital signs were monitored continuously by radiology nursing throughout the procedure under my direct supervision. FLUOROSCOPY TIME:  Fluoroscopy Time: 3 minutes 18 seconds (62 mGy). COMPLICATIONS: None immediate. PROCEDURE: Informed written consent was obtained from the patient after a thorough discussion of the procedural risks, benefits and alternatives. All questions were addressed. Maximal Sterile Barrier Technique was utilized including caps, mask, sterile gowns, sterile gloves, sterile drape, hand hygiene and skin antiseptic. A timeout was performed prior to the initiation of the procedure. The right upper quadrant was interrogated with ultrasound. Interval development of progressive biliary ductal dilatation. Dilated bile ducts can be identified sonographically. A suitable posterior division all duct in the right liver was identified. The overlying skin was anesthetized with 1% lidocaine. Under real-time sonographic guidance, the bile duct was punctured with a 21 gauge Accustick needle in a single pass. There is immediate return of turbid bile through the needle hub. A gentle hand injection of contrast material was performed in a limited cholangiogram was obtained. There is extensive intra hepatic biliary ductal dilatation. The 0.018 wire was advanced into the biliary system centrally. The Accustick needle was then exchanged for the Accustick sheath which was advanced into the common hepatic duct. Additional contrast injection was performed. There is a high-grade lung segment stenosis of the common bile duct. A small wisp of contrast material passes through the stricture and into the duodenum. An Amplatz wire was successfully advanced through the stricture and into the duodenum. The skin tract was then dilated to 10 [place] and a Cook 10 French internal/external biliary drainage catheter was advanced over the wire and positioned with  the locking loop in the duodenum.  The tube was connected to bag drainage and secured to the skin with 0 Prolene suture. A sterile bandage was applied. The patient tolerated the procedure well. IMPRESSION: 1. 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 2. Successful placement of a 10 French internal/external biliary drainage catheter. Aspirated bile is frankly purulent. A sample was sent for culture. PLAN: 1. Maintain biliary drain to bag drainage until symptoms of cholangitis have resolved and bilirubin has nearly normalized. 2. It would be preferable to cap the biliary drain prior to discharge if the above can be accomplished. 3. Follow electrolytes and replete as needed. 4. Return to interventional radiology in 2-4 weeks for biliary stent placement. Signed, Criselda Peaches, MD [facility] Radiology Electronically Signed   By: Jacqulynn Cadet M.D.   On: 05/07/2016 11:22   ERCP by Dr. Carlean Purl 05/06/16 Impression: - Biliary stent removal. Clogged stent and cholangitis in setting of pancreatic cancer - A severe biliary stricture was found in the bile duct. The stricture was malignant appearing. Could not cross it today except with wire so no stent replacement Recommendation: - IR consult for percutaneous drainage - then will see. I will message Dr. Barry Dienes and Burr Medico that patient in hose and will not make appointments next week.  ERCP, with biliary sphincterotomy and biliary stent placement by Dr. Henrene Pastor 03/18/16 Impression: 1. Malignant-appearing distal bile duct stricture status post ERCP with sphincterotomy and plastic biliary stent placement 2. Guidewire finding false lumen with subsequent injection as described 3. Status post cholecystectomy  ASSESSMENT & PLAN: 71 y.o. female with PMH of IDDM  with neuropathy, HFpEF, HTN, OSA on intermittent home oxygen, and recently diagnosed pancreatic mass s/p biliary stent 04/28/2016 who presented 3/14 with fever, abdominal pain and jaundice  1. Acute cholangitis, Bile culture grew pantoea and Enterococcus 2. Klebsiella UTI 3. Pancreatic adenocarcinoma, probably resectable  4. IDDM  5. Acute encephalopathy, secondary to infection and liver dysfunction, much improved  6. OSA 7. Chronic diastolic heart failure  8. Malnutrition 9. Anorexia secondary to infection and malignancy   Recommendations: -I reviewed her CT scan findings, her pancreatic cancer is probably resectable, no evidence of distant metastasis on CT scans. She was evaluated by general surgeon Dr. Barry Dienes. However she may not be a good candidate due to her advanced age, comorbidities, malnutrition and a very poor performance status. -We reviewed the natural history of pancreatic cancer, and a very high risk of recurrence after surgical resection. Most people requires neoadjuvant or adjuvant chemotherapy and possible radiation -her current status makes her a very poor candidate for systemic chemotherapy -we discussed the benefit of radiation therapy, if surgery is not available, at least not in the near future, to control her disease, although it's more palliative. She is interested in radiation.  -We also discuss the option of palliative care alone, if she deemed to be not a surgical candidate.  -I will present her case in our tumor board next week, Will discuss with Dr. Barry Dienes and rad/onc Dr. Lisbeth Renshaw -I plan to see her back in my clinic after her discharge   Thanks for the opportunity to participate this lady's care.   All questions were answered. The patient knows to call the clinic with any problems, questions or concerns.      Truitt Merle, MD 05/11/2016 10:19 PM  This document serves as a record of services personally performed  by Truitt Merle, MD. It was created on her behalf by Darcus Austin, a trained medical scribe. The creation of this record is based on the scribe's personal observations and the provider's statements to them. This document has been checked and approved by the attending provider.

## 2016-05-11 NOTE — Progress Notes (Signed)
Referring Physician(s):  Dr. Silvano Rusk  Supervising Physician: Markus Daft  Patient Status:  [facility] - In-pt  Chief Complaint:  Obstructive jaundice secondary to neoplasm  Subjective: Patient sitting up in bed.  Denies complaints today.  Allergies: Cymbalta [duloxetine hcl]; Statins; Sulfa antibiotics; Levemir [insulin detemir]; Morphine; and Zinc  Medications: Prior to Admission medications   Medication Sig Start Date End Date Taking? Authorizing Provider  aspirin EC 81 MG tablet Take 1 tablet (81 mg total) by mouth daily. 07/08/14  Yes Rowe Clack, MD  carvedilol (COREG) 25 MG tablet TAKE 1 TABLET BY MOUTH TWICE DAILY WITH FOOD 01/20/16  Yes Jolaine Artist, MD  Cholecalciferol (VITAMIN D3) 2000 units capsule Take 1 capsule (2,000 Units total) by mouth daily. 06/17/15  Yes Janith Lima, MD  citalopram (CELEXA) 20 MG tablet TAKE 1 TABLET (20 MG TOTAL) BY MOUTH DAILY. 01/27/16  Yes [name], MD  DULoxetine (CYMBALTA) 60 MG capsule Take 1 capsule (60 mg total) by mouth daily. 09/25/15  Yes [name], MD  ezetimibe (ZETIA) 10 MG tablet TAKE ONE TABLET BY MOUTH DAILY 02/03/16  Yes [name], MD  gabapentin (NEURONTIN) 300 MG capsule TAKE TWO CAPSULES BY MOUTH FOUR TIMES A DAY 01/19/16  Yes [name], MD  hydrALAZINE (APRESOLINE) 25 MG tablet TAKE 1 TABLET (25 MG TOTAL) BY MOUTH 3 (THREE) TIMES DAILY. 02/16/16  Yes Jolaine Artist, MD  HYDROcodone-acetaminophen (NORCO) 10-325 MG tablet Take 1 tablet by mouth every 6 (six) hours as needed. 04/08/16  Yes [name], MD  insulin aspart (NOVOLOG) 100 UNIT/ML injection Inject 0-20 Units into the skin 3 (three) times daily with meals. . CBG 70 - 120: 0 units CBG 121 - 150: 3 units CBG 151 - 200: 4 units CBG 201 - 250: 7 units CBG 251 - 300: 11 units CBG 301 - 350: 15 units CBG 351 - 400: 20 units 03/21/16  Yes Mariel Aloe, MD  insulin glargine (LANTUS) 100 UNIT/ML injection  Inject 0.3 mLs (30 Units total) into the skin daily. 03/22/16  Yes Mariel Aloe, MD  LYRICA 200 MG capsule TAKE ONE CAPSULE BY MOUTH TWICE A DAY 04/07/16  Yes [name], MD  meloxicam (MOBIC) 7.5 MG tablet TAKE ONE TABLET BY MOUTH DAILY 03/11/16  Yes [name], MD  metFORMIN (GLUCOPHAGE-XR) 500 MG 24 hr tablet Take 1,000 mg by mouth daily with breakfast.    Yes Historical Provider, MD  metolazone (ZAROXOLYN) 2.5 MG tablet Take 1 tablet (2.5 mg total) by mouth 2 (two) times a week. 12/17/15  Yes Gera Friar, PA-C  rOPINIRole (REQUIP XL) 4 MG 24 hr tablet TAKE 1 TABLET (4 MG TOTAL) BY MOUTH AT BEDTIME. 02/23/16  Yes [name], MD  spironolactone (ALDACTONE) 25 MG tablet TAKE ONE TABLET EACH DAY 04/21/16  Yes Larey Dresser, MD  torsemide (DEMADEX) 100 MG tablet Take 1 tablet (100 mg total) by mouth daily. 03/21/16  Yes Mariel Aloe, MD  vitamin B-12 (CYANOCOBALAMIN) 500 MCG tablet Take 500 mcg by mouth daily. Reported on 09/09/2015   Yes Historical Provider, MD     Vital Signs: BP (!) 149/52 (BP Location: Right Arm)   Pulse (!) 51   Temp 97.6 F (36.4 C)   Resp 18   Ht 4' 10"  (1.473 m)   Wt 245 lb 9.5 oz (111.4 kg)   SpO2 99%   BMI 51.33 kg/m   Physical Exam  Alert, NAD Abd:  Soft, NT in RUQ, PTC drain in place with drain site c/d/I. Bilious output in bag- not recorded.  Imaging: No results found.  Labs:  CBC:  Recent Labs  05/08/16 0535 05/09/16 0419 05/10/16 0533 05/11/16 0719  WBC 7.2 6.4 6.5 6.9  HGB 8.5* 8.3* 8.4* 8.4*  HCT 27.1* 26.8* 27.0* 27.0*  PLT 308 325 374 410*    COAGS:  Recent Labs  03/15/16 1116 05/05/16 0023  INR 0.94 0.97  APTT 30 32    BMP:  Recent Labs  05/08/16 0535 05/09/16 0419 05/10/16 0533 05/11/16 0719  NA 134* 137 140 142  K 3.6 3.6 3.4* 3.4*  CL 96* 96* 95* 101  CO2 32 32 34* 32  GLUCOSE 198* 173* 145* 131*  BUN 19 20 11 7   CALCIUM 8.0* 8.4* 8.9 8.6*  CREATININE 0.97 0.98 0.84  0.70  GFRNONAA 58* 57* >60 >60  GFRAA >60 >60 >60 >60    LIVER FUNCTION TESTS:  Recent Labs  05/08/16 0535 05/09/16 0419 05/10/16 0533 05/11/16 0719  BILITOT 9.0* 6.3* 4.5* 3.2*  AST 112* 72* 46* 36  ALT 53 44 36 30  ALKPHOS 745* 673* 666* 593*  PROT 5.2* 5.3* 5.8* 5.1*  ALBUMIN 1.5* 1.5* 1.6* 1.6*    Assessment and Plan: Obstructive jaundice secondary to CBD obstruction from malignant neoplasm s/p drain placement  T bili 3.2 today. Overall is improving and continues tolerate diet. Recommendation was for possible bare metal stent to be placed in 1-2 weeks after PTC drain placement.  IR to follow.   Electronically Signed: Docia Barrier 05/11/2016, 12:19 PM   I spent a total of 15 Minutes at the the patient's bedside AND on the patient's hospital floor or unit, greater than 50% of which was counseling/coordinating care for CBD with obstructive jaundice.

## 2016-05-11 NOTE — NC FL2 (Signed)
[place] LEVEL OF CARE SCREENING TOOL     IDENTIFICATION  Patient Name: MILISSA FESPERMAN Birthdate: 1945-02-23 Sex: female Admission Date (Current Location): 05/04/2016  [facility] and [place] Number:  Herbalist and Address:  [facility] [place], [place] [address] [place], [place] [address]      Provider Number: [id number]  Attending Physician Name and Address:  Patrecia Pour, MD  Relative Name and Phone Number:       Current Level of Care: Hospital Recommended Level of Care: [place] Prior Approval Number:    Date Approved/Denied:   PASRR Number: [id number]  Discharge Plan: SNF    Current Diagnoses: Patient Active Problem List   Diagnosis Date Noted  . Malignant neoplasm of head of pancreas ([place])   . Cholangitis 05/05/2016  . Pancreatic mass   . Common bile duct stricture   . Obstructive jaundice   . Abnormal CT of the abdomen   . Jaundice 03/15/2016  . Hyperbilirubinemia 03/15/2016  . Diabetic peripheral neuropathy ([place]) 02/23/2016  . History of fall 10/22/2015  . Bradycardia 10/09/2015  . Left foot pain 07/10/2015  . Murmur, cardiac 07/02/2015  . Chronic pain syndrome 05/05/2015  . MDD (major depressive disorder), single episode, severe ([place]) 05/05/2015  . Chronic anemia 04/14/2015  . IDDM (insulin dependent diabetes mellitus) ([place]) 03/19/2015  . Rectal bleeding 03/13/2015  . Hyperlipidemia 12/23/2014  . Deficiency anemia 07/26/2012  . Venous stasis ulcers of both lower extremities ([place]) 01/04/2011  . INSOMNIA 08/03/2009  . Chronic diastolic heart failure ([place]) 06/01/2009  . Vitamin D deficiency 05/12/2009  . Sleep apnea 04/29/2009  . Dyslipidemia 04/20/2009  . Morbid obesity ([place]) 04/20/2009  . RESTLESS LEGS SYNDROME 04/20/2009  . Peripheral neuropathy ([place]) 04/20/2009  . Essential hypertension 04/20/2009  . Low back pain 04/20/2009    Orientation RESPIRATION BLADDER Height & Weight        O2 (3L [place]) Incontinent Weight: 245 lb 9.5 oz (111.4 kg) Height:  4\' 10"  (147.3 cm)  BEHAVIORAL SYMPTOMS/MOOD NEUROLOGICAL BOWEL NUTRITION STATUS      Continent Diet (see DC summary)  AMBULATORY STATUS COMMUNICATION OF NEEDS Skin   Extensive Assist Verbally                         Personal Care Assistance Level of Assistance  Bathing, Dressing Bathing Assistance: Maximum assistance   Dressing Assistance: Maximum assistance     Functional Limitations Info             SPECIAL CARE FACTORS FREQUENCY  PT (By licensed PT), OT (By licensed OT)     PT Frequency: 5/wk OT Frequency: 5/wk            Contractures      Additional Factors Info  Code Status, Allergies, Insulin Sliding Scale Code Status Info: FULL Allergies Info: Cymbalta Duloxetine Hcl, Statins, Sulfa Antibiotics, Levemir Insulin Detemir, Morphine, Zinc   Insulin Sliding Scale Info: 8/day       Current Medications (05/11/2016):  This is the current hospital active medication list Current Facility-Administered Medications  Medication Dose Route Frequency Provider Last Rate Last Dose  . 0.9 %  sodium chloride infusion   Intravenous Continuous Vena Rua, PA-C 100 mL/hr at 05/11/16 0755    . aspirin EC tablet 81 mg  81 mg Oral Daily [name], MD   81 mg at 05/11/16 0842  . carvedilol (COREG) tablet 25 mg  25  mg Oral BID WC Patrecia Pour, MD   25 mg at 05/11/16 [phone]  . citalopram (CELEXA) tablet 20 mg  20 mg Oral Daily [name], MD   20 mg at 05/11/16 0842  . enoxaparin (LOVENOX) injection 40 mg  40 mg Subcutaneous Q24H Docia Barrier, PA   40 mg at 05/11/16 0532  . ezetimibe (ZETIA) tablet 10 mg  10 mg Oral Daily [name], MD   10 mg at 05/11/16 0841  . feeding supplement (PRO-STAT SUGAR FREE 64) liquid 30 mL  30 mL Oral BID Patrecia Pour, MD   30 mL at 05/10/16 1435  . furosemide (LASIX) tablet 20 mg  20 mg Oral Daily Theodis Blaze, MD   20 mg at 05/11/16  0842  . HYDROcodone-acetaminophen (NORCO) 10-325 MG per tablet 1 tablet  1 tablet Oral Q6H PRN [name], MD   1 tablet at 05/08/16 0553  . insulin aspart (novoLOG) injection 0-9 Units  0-9 Units Subcutaneous Q4H Theodis Blaze, MD   1 Units at 05/11/16 0840  . insulin glargine (LANTUS) injection 8 Units  8 Units Subcutaneous BID Theodis Blaze, MD   8 Units at 05/11/16 1000  . lactulose (CHRONULAC) 10 GM/15ML solution 20 g  20 g Oral Daily Vena Rua, PA-C   20 g at 05/11/16 0841  . ondansetron (ZOFRAN) tablet 4 mg  4 mg Oral Q6H PRN [name], MD   4 mg at 05/07/16 1946   Or  . ondansetron (ZOFRAN) injection 4 mg  4 mg Intravenous Q6H PRN [name], MD   4 mg at 05/06/16 0114  . piperacillin-tazobactam (ZOSYN) IVPB 3.375 g  3.375 g Intravenous Q8H Veronda P Bryk, RPH   3.375 g at 05/11/16 0407  . pregabalin (LYRICA) capsule 200 mg  200 mg Oral BID [name], MD   200 mg at 05/11/16 0842  . rOPINIRole (REQUIP) tablet 2 mg  2 mg Oral BID Theodis Blaze, MD   2 mg at 05/11/16 8466     Discharge Medications: Please see discharge summary for a list of discharge medications.  Relevant Imaging Results:  Relevant Lab Results:   Additional Information SS#: [id number]  Jorge Ny, LCSW

## 2016-05-11 NOTE — Progress Notes (Signed)
Pharmacy Antibiotic Note  Melissa Myers is a 71 y.o. female admitted on 05/04/2016 with concern for intra-abdominal infection.  Pharmacy has been consulted for Zosyn dosing.  The patient is noted to have a fairly sensitive Klebsiella PNA UTI and Pantoea sp and Enterococcus faecalis from biliary drain cultures.  All susceptible to Zosyn.  Today is day #7 of therapy.  Plan: Zosyn 3.375g IV q8h (4 hour infusion).  Will continue to follow renal function and adjust dose as needed. Follow-up length of therapy (maybe discontinue 3/24 - 7 days after biliary drain placement).    Height: 4\' 10"  (147.3 cm) Weight: 245 lb 9.5 oz (111.4 kg) IBW/kg (Calculated) : 40.9  Temp (24hrs), Avg:97.6 F (36.4 C), Min:97.4 F (36.3 C), Max:97.8 F (36.6 C)   Recent Labs Lab 05/05/16 0355  05/07/16 0808 05/08/16 0535 05/09/16 0419 05/10/16 0533 05/11/16 0719  WBC  --   < > 5.9 7.2 6.4 6.5 6.9  CREATININE  --   < > 0.97 0.97 0.98 0.84 0.70  LATICACIDVEN 1.71  --   --   --   --   --   --   < > = values in this interval not displayed.  Estimated Creatinine Clearance: 71.4 mL/min (by C-G formula based on SCr of 0.7 mg/dL).    Allergies  Allergen Reactions  . Cymbalta [Duloxetine Hcl] Other (See Comments)    Restless leg syndrome  . Statins Other (See Comments)    Statin drugs cause muscle pain / "muscle damage"--was told by MD not to take  . Sulfa Antibiotics Diarrhea  . Levemir [Insulin Detemir] Itching  . Morphine Other (See Comments)    GI upset and headaches  . Zinc Swelling and Rash   Zosyn 3/15 >> Ceftriaxone x1 3/15  3/15 BCx: neg 3/15 UCx >> 100k K PNA (originally listed as ESBL but sens pattern did not make sense, discussed with micro and depressed ESBL result since likely a false +) 3/17 Biliary drain >> pantoea sp (pan sens) and enterococcus faecalis (Vanc/Amp sens)   Thank you for allowing pharmacy to be a part of this patient's care.  [employer], Pharm.D.,  BCPS Clinical Pharmacist Pager [phone] 05/11/2016 10:30 AM

## 2016-05-11 NOTE — Consult Note (Signed)
Consultation Note Date: 05/11/2016   Patient Name: Melissa Myers  DOB: 1945/03/22  MRN: [id number]  Age / Sex: 71 y.o., female  PCP: [name], MD Referring Physician: Patrecia Pour, MD  Reason for Consultation: Establishing goals of care and Psychosocial/spiritual support  HPI/Patient Profile: 71 y.o. female  with past medical history of IDDM, diabetic neuropathy, CHF with preserved EF, HTN, OSA with intermittent home O2, and newly diagnosed pancreatic adenocarcinoma s/p biliary stent for distal CBD stricture. She then presented to the ED with fever, abdominal pain,and N/V. Her LFTs were again elevated, as were WBCs. She was subsequently admitted on 05/04/2016 for work-up. GI performed an ERCP on 3/16, which noted and removed a clogged stent, cholangitis, and a severe biliary stricture for which a stent could not be placed. She then had percutaneous biliary drain placed by IR with plan for metal stent placement in 1-2 weeks. Purulent bile grew pantoea species, enterococcus faecalis. She also grew Klebsiella in her urine. Zosyn sufficient coverage for both. Med Onc, Dr. Burr Medico, has been by. She reviewed the new diagnosis with Melissa Myers and her family, and discussed treatment routes. The plan is to discuss her case in tumor board next week and follow-up in clinic after discharge. Palliative consulted to assist family in goals of care, in light of her poor prognosis.   Clinical Assessment and Goals of Care: I met Melissa Myers and her husband Jonni Sanger at her bedside. We had a long discussion surrounding their understanding of the new diagnosis of pancreatic cancer, as well as the options moving forward. Melissa Myers is struggling with short term memory, however her husband was able to fill in the details. They understand that her functional status makes her a poor candidate for surgery or chemotherapy, but either of these may be options if she were to get stronger  and have better nutrition. Even if these could be pursued, they know the cancer may come back. At this point, radiation seems like a possibility. Jonni Sanger explained that the goal would be to try to treat the cancer to stop it growing and help prevent further issues, but he knows it is only giving her more time and would not cure the cancer.   In exploring their understanding of the disease, I also attempted to elicit an understanding of their overall goals. Blaise relates that joy in her life comes from time at home with her two dogs and seeing her two grandchildren. She has previously been an avid shell collector at the beach (scallop shells in particular) and a gardener, however her fatigue and weakness have limited these activities the past few months. Her hope in pursuing treatment for her cancer is to improve her symptoms and prevent further complications, and to give her more time to enjoy her dogs and family. I gently brought up the possibility that there may come a point when treatment is not providing her that measure of quality of life, at that point it would be okay to change focus into more of a comfort path. She and her husband acknowledged this possibility are seemed accepting that this could occur. They are both very hopeful, however, for some measure of improvement after so many months of decline.   In terms of discharge, we talked about the benefits of a brief rehab stay for strength building. Her husband is already supporting another 16yo family member at home [employer]") and he does not feel able to care for her in her current weak state. She has agreed  to rehab, with plan to transition home as soon as able. I advised palliative follow at rehab, with transition to [place] program when she is discharged home (of note, she was followed by [facility] previously, but they are a home-based program only and will not follower her at [facility] They can resume care once she is discharged  home).   Finally, I did gently try to address code status with Melissa Myers, however she became very withdrawn and changed the subject. In my attempt to bring this subject up, her husband offhandedly mentioned that there had been a lot of new information, and they were overwhelmed by all of the decisions they were being asked to make.   Primary Decision Maker PATIENT   SUMMARY OF RECOMMENDATIONS    Full code, continue full scope treatment  Plan for follow-up outpatient with Oncology to discuss treatment options  SNF/rehab with palliative after discharge, plan to utilize [facility] once at home  Home palliative care can further discuss code status outpatient  **Palliative will follow peripherally at this point. Clear goals of care and well controlled symptoms. I would defer further conversations about code status until after discharge. Please call Palliative team with any specific questions or concerns: [phone].  Code Status/Advance Care Planning:  Full code  Symptom Management:   Pt with poor oral intake; I've asked the Dietician to increase protein shakes with meals (ideally 2-3 with each tray). She will try to sip on these throughout the day.  Additional Recommendations (Limitations, Scope, Preferences):  Full Scope Treatment  Psycho-social/Spiritual:   Desire for further Chaplaincy support:no  Prognosis:   < 6 months. Pt with pancreatic cancer and not a surgical or chemo candidate. Significant fatigue and weakness with variable oral intake.   Discharge Planning: [place] for rehab with Palliative care service follow-up      Primary Diagnoses: Present on Admission: . Cholangitis . Essential hypertension . Chronic diastolic heart failure ([place]) . Morbid obesity ([place]) . Diabetic peripheral neuropathy ([place]) . Pancreatic mass   I have reviewed the medical record, interviewed the patient and family, and examined the patient. The following aspects  are pertinent.  Past Medical History:  Diagnosis Date  . Anemia   . Cellulitis    LOWER EXTREMITIES  . Chronic diarrhea    a/w nausea - felt related to IBS  . Deaf    left side only  . Diastolic CHF ([place])   . Disc degeneration, lumbar   . GERD (gastroesophageal reflux disease)   . Hyperlipidemia    hx rhabdo on statins  . Hypertension   . Neuropathy ([facility])    feet, toes and fingers  . On home oxygen therapy    uses oxygen 2 liters min per [place] at night and prn during day  . OSA (obstructive sleep apnea)    05/2009 sleep study - refuses CPAP  . Osteoarthritis   . RLS (restless legs syndrome)   . Shortness of breath    chronic  . Stasis dermatitis   . Type II or unspecified type diabetes mellitus without mention of complication, not stated as uncontrolled    insulin dep   Social History   Social History  . Marital status: Married    Spouse name: N/A  . Number of children: N/A  . Years of education: N/A   Social History Main Topics  . Smoking status: Never Smoker  . Smokeless tobacco: Never Used  . Alcohol use No  . Drug use: No  . Sexual  activity: No   Other Topics Concern  . None   Social History Narrative   Lives with spouse and mother. Retired Network engineer, now housewife   Family History  Problem Relation Age of Onset  . Heart disease Mother   . Heart attack Mother 70  . Heart disease Father   . Heart attack Father 43  . Heart disease      family history  . Stomach cancer Paternal Grandmother   . Lung cancer Paternal Grandfather   . CVA      several aunts  . Heart attack      several aunts and an uncle  . Colon cancer Neg Hx    Scheduled Meds: . aspirin EC  81 mg Oral Daily  . carvedilol  25 mg Oral BID WC  . citalopram  20 mg Oral Daily  . enoxaparin (LOVENOX) injection  40 mg Subcutaneous Q24H  . ezetimibe  10 mg Oral Daily  . feeding supplement (PRO-STAT SUGAR FREE 64)  30 mL Oral BID  . furosemide  20 mg Oral Daily  . insulin aspart  0-9 Units  Subcutaneous Q4H  . insulin glargine  8 Units Subcutaneous BID  . lactulose  20 g Oral Daily  . piperacillin-tazobactam (ZOSYN)  IV  3.375 g Intravenous Q8H  . pregabalin  200 mg Oral BID  . rOPINIRole  2 mg Oral BID   Continuous Infusions: . sodium chloride 100 mL/hr at 05/11/16 0755   PRN Meds:.HYDROcodone-acetaminophen, ondansetron **OR** ondansetron (ZOFRAN) IV Allergies  Allergen Reactions  . Cymbalta [Duloxetine Hcl] Other (See Comments)    Restless leg syndrome  . Statins Other (See Comments)    Statin drugs cause muscle pain / "muscle damage"--was told by MD not to take  . Sulfa Antibiotics Diarrhea  . Levemir [Insulin Detemir] Itching  . Morphine Other (See Comments)    GI upset and headaches  . Zinc Swelling and Rash   Review of Systems  Constitutional: Positive for activity change, appetite change and fatigue.  HENT: Negative for hearing loss, sore throat and trouble swallowing.   Eyes: Negative for visual disturbance.  Respiratory: Negative for cough, chest tightness and shortness of breath.   Cardiovascular: Negative for chest pain.  Gastrointestinal: Positive for abdominal pain (mild intermittent). Negative for abdominal distention and constipation.  Musculoskeletal: Negative for back pain and gait problem.  Neurological: Positive for weakness and numbness (neuropathy). Negative for dizziness, speech difficulty and light-headedness.  Hematological: Bruises/bleeds easily.  Psychiatric/Behavioral: Positive for confusion. Negative for sleep disturbance. The patient is nervous/anxious.    Physical Exam  Constitutional: She is oriented to person, place, and time. No distress.  Obese woman calm in bed  HENT:  Head: Normocephalic and atraumatic.  Mouth/Throat: Oropharynx is clear and moist. No oropharyngeal exudate.  Eyes: EOM are normal.  Neck: Normal range of motion.  Cardiovascular: Bradycardia present.   Pulmonary/Chest: Effort normal. No respiratory distress.    Abdominal: Soft. Bowel sounds are normal. She exhibits no distension. There is no tenderness.  Biliary drain with drainage noted. Site benign.   Musculoskeletal: She exhibits edema (trace in BLE).  Generalized weakness.  Neurological: She is alert and oriented to person, place, and time.  Skin: Skin is warm and dry. There is pallor.  Slight skin yellowing  Psychiatric: She has a normal mood and affect. Her speech is normal. Judgment and thought content normal. She is slowed. She exhibits abnormal recent memory.   Vital Signs: BP (!) 149/52 (BP Location: Right Arm)  Pulse (!) 51   Temp 97.6 F (36.4 C)   Resp 18   Ht 4' 10"  (1.473 m)   Wt 111.4 kg (245 lb 9.5 oz)   SpO2 99%   BMI 51.33 kg/m  Pain Assessment: No/denies pain   Pain Score: 0-No pain  SpO2: SpO2: 99 % O2 Device:SpO2: 99 % O2 Flow Rate: .O2 Flow Rate (L/min): 3 L/min  IO: Intake/output summary:  Intake/Output Summary (Last 24 hours) at 05/11/16 0820 Last data filed at 05/11/16 0755  Gross per 24 hour  Intake             2165 ml  Output                0 ml  Net             2165 ml   LBM: Last BM Date: 05/06/16; pt and husband report large BM 3/21. Baseline Weight: Weight: 108.9 kg (240 lb) Most recent weight: Weight: 111.4 kg (245 lb 9.5 oz)     Palliative Assessment/Data: PPS 40-50%    Time Total: 70 minutes Greater than 50%  of this time was spent counseling and coordinating care related to the above assessment and plan.  Signed by: Charlynn Court, NP Palliative Medicine Team Pager # [phone] (M-F 7a-5p) Team Phone # [phone] (Nights/Weekends)

## 2016-05-11 NOTE — Progress Notes (Signed)
Patient ID: Melissa Myers, female   DOB: [birth year], 71 y.o.   MRN: [id number]    PROGRESS NOTE  Melissa Myers  XBJ:[id number] DOB: Dec 12, 1945 DOA: 05/04/2016  PCP: [name], MD   Brief Narrative:  Melissa Myers is a 71 yo female with a history of IDDM with neuropathy, HFpEF, HTN, OSA on intermittent home oxygen, and recently diagnosed pancreatic mass s/p biliary stent 04/28/2016 who presented 3/14 with fever, abdominal pain and jaundice. She was diagnosed with cholangitis, recurrent jaundice, and zosyn was started. ERCP on 3/16 showed a clogged stent which was removed though new stent was not able to be placed across malignant stricture. On 3/17 a 10Fr int/ext biliary drain was placed with aspiration of purulent bile. Blood cultures remained negative, though bile culture grew pantoea species, enterococcus faecalis. Urine culture had also grown Klebsiella, sensitive to zosyn.   History includes: - 2009 ERCP with 12 mm sphincterotomy due to jaundice, with ? filling defect but no stones on bile duct sweep. - Admission 1/23 until 1/29 for obstructive jaundice, 03/18/16 ERCP with biliary stent placement across malignant-appearing distal CBD stricture. CA 19-9: 107 - EUS 04/28/16: stent in good position. 2.9 cm mass in pancreatic uncinate obstructing main PD and bile duct.  Assessment & Plan: Pancreatic cancer with obstructive jaundice, bacterial cholangitis: s/p removal of clogged stent 3/16 at ERCP with unsuccessful effort at restenting. Had perc drain placed 3/17 by IR. Bile culture grew pantoea and Enterococcus.  - Discussed with pharmacy and according to the [facility] guide and local antibiogram, zosyn will be sufficient for both in addition to Klebsiella UTI. Continue zosyn - Discussed with Dr. Burr Medico, who will plan to evaluate in next couple days. - Surgery consulted regarding surgical options, i.e. [place]: Her current functional and nutrition status makes her a poor  candidate.  - Monitoring bilirubin daily, trending downward.  - Due to overall poor prognosis, will have palliative care team get involved AFTER oncology evaluation.  Toxic metabolic encephalopathy: Improving with lactulose, presumably was due to elevated ammonia. CT head unremarkable.  - GI started lactulose 3/20, will continue monitoring.   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 At inpatient goal. - Continue lantus and SSI                  Acute kidney injury: Prerenal, resolved. - Monitor, IVF's restarted with AMS limiting po intake.  Chronic HFpEF: Last echo 11/2015 with EF 65%, G1DD.  - Daily weights stable 245-246lbs - IVF started by GI given poor po, will monitor I/O closely - Appears euvolemic, will hold both IVF and lasix 3/22 and monitor closely.  Bradycardia: Asymptomatic. - Hold parameters placed on beta blocker.   OSA - CPAP qHS (has been declined by pt)  Klebsiella UTI - Zosyn started 3/15, having likely completed treatment for UTI indication.  Hypokalemia: Resolved with repletion - Monitor, reorder 42mEq today for K of 3.4.   Hyponatremia: Resolved.   Morbid obesity BMI is 51.42kg/m. - May make malnutrition more difficult to assess by habitus  DVT prophylaxis:  Lovenox Code Status: Full  Family Communication: None at bedside this AM Disposition Plan: Anticipate eventual DC to SNF based on level of debility.   Consultants:   GI  IR  Surgery   Procedures:   1/26 ERCP with sphincterotomy and stent placement across malignant CBD stricture.    3/8 EUS.  Mass (adenocarcinoma) at uncinate.    3/15 admission with cholangitis, recurrent jaundice.  Zosyn started  3/16 ERCP.  Clogged stent removed, unable to pass wire and place new stent across severe malignant stricture.   3/17 Dr Laurence Ferrari placed 47fr int/ext biliary drain, aspirated purulent bile. Blood cx's negative. Bile culture grew pantoea species,  enterococcus faecalis.    Antimicrobials:   Zosyn 3/15 >  Subjective: Much less drowsy today, but still unaware of where she is. Denies any pain or other concerns.   Objective: Vitals:   05/10/16 0505 05/10/16 1413 05/10/16 2121 05/11/16 0529  BP: (!) 119/36 (!) 132/47 (!) 154/55 (!) 149/52  Pulse: (!) 50 (!) 48 (!) 50 (!) 51  Resp: 17 20 18 18   Temp: 98.1 F (36.7 C) 97.4 F (36.3 C) 97.8 F (36.6 C) 97.6 F (36.4 C)  TempSrc: Oral Oral    SpO2: 100% 98% 100% 99%  Weight: 111.6 kg (246 lb 0.5 oz)   111.4 kg (245 lb 9.5 oz)  Height:        Intake/Output Summary (Last 24 hours) at 05/11/16 1430 Last data filed at 05/11/16 1041  Gross per 24 hour  Intake             2283 ml  Output                0 ml  Net             2283 ml   Filed Weights   05/09/16 0459 05/10/16 0505 05/11/16 0529  Weight: 111.9 kg (246 lb 12.8 oz) 111.6 kg (246 lb 0.5 oz) 111.4 kg (245 lb 9.5 oz)    Examination:  General exam: Morbidly obese 71yo F in no distress, rousable and interactive Respiratory system: Respiratory effort normal. Diminished breath sounds at bases  Cardiovascular system: S1 & S2 heard, RRR. No JVD, murmurs, rubs, gallops or clicks. Trace bilateral pitting edema Gastrointestinal system: Abdomen is nondistended, Normal bowel sounds heard. Slightly tender in epigastric area. Right abdomen with perc drain draining dark green fluid. Skin: Diffusely jaundiced, though improved. CNS: Alert and oriented only to person. No focal deficits, but very diffusely weak.   Data Reviewed: I have personally reviewed following labs and imaging studies  CBC:  Recent Labs Lab 05/05/16 0023  05/07/16 0808 05/08/16 0535 05/09/16 0419 05/10/16 0533 05/11/16 0719  WBC 11.0*  < > 5.9 7.2 6.4 6.5 6.9  NEUTROABS 8.1*  --   --   --   --   --   --   HGB 11.4*  < > 8.8* 8.5* 8.3* 8.4* 8.4*  HCT 33.9*  < > 28.2* 27.1* 26.8* 27.0* 27.0*  MCV 86.3  < > 89.8 88.9 88.7 87.7 89.4  PLT 347  < > 275 308  325 374 410*  < > = values in this interval not displayed. Basic Metabolic Panel:  Recent Labs Lab 05/07/16 0808 05/08/16 0535 05/09/16 0419 05/10/16 0533 05/11/16 0719  NA 138 134* 137 140 142  K 2.9* 3.6 3.6 3.4* 3.4*  CL 97* 96* 96* 95* 101  CO2 34* 32 32 34* 32  GLUCOSE 201* 198* 173* 145* 131*  BUN 15 19 20 11 7   CREATININE 0.97 0.97 0.98 0.84 0.70  CALCIUM 7.7* 8.0* 8.4* 8.9 8.6*   Liver Function Tests:  Recent Labs Lab 05/07/16 0808 05/08/16 0535 05/09/16 0419 05/10/16 0533 05/11/16 0719  AST 113* 112* 72* 46* 36  ALT 54 53 44 36 30  ALKPHOS 677* 745* 673* 666* 593*  BILITOT 8.6* 9.0* 6.3* 4.5* 3.2*  PROT 5.3*  5.2* 5.3* 5.8* 5.1*  ALBUMIN 1.6* 1.5* 1.5* 1.6* 1.6*    Recent Labs Lab 05/05/16 0023  LIPASE 27    Recent Labs Lab 05/05/16 0023  AMMONIA 51*   Coagulation Profile:  Recent Labs Lab 05/05/16 0023  INR 0.97   Cardiac Enzymes:  Recent Labs Lab 05/05/16 0023  TROPONINI <0.03   BNP (last 3 results)  Recent Labs  11/04/15 0956  PROBNP 20.0   CBG:  Recent Labs Lab 05/10/16 2012 05/10/16 2350 05/11/16 0327 05/11/16 0806 05/11/16 1234  GLUCAP 250* 167* 140* 134* 172*   Urine analysis:    Component Value Date/Time   COLORURINE YELLOW 05/05/2016 0219   APPEARANCEUR CLEAR 05/05/2016 0219   LABSPEC 1.007 05/05/2016 0219   PHURINE 5.0 05/05/2016 0219   GLUCOSEU >=500 (A) 05/05/2016 0219   GLUCOSEU NEGATIVE 11/04/2015 0956   HGBUR NEGATIVE 05/05/2016 0219   BILIRUBINUR NEGATIVE 05/05/2016 0219   BILIRUBINUR neg 08/17/2015 1547   KETONESUR NEGATIVE 05/05/2016 0219   PROTEINUR NEGATIVE 05/05/2016 0219   UROBILINOGEN 0.2 11/04/2015 0956   NITRITE POSITIVE (A) 05/05/2016 0219   LEUKOCYTESUR TRACE (A) 05/05/2016 0219   Recent Results (from the past 240 hour(s))  Urine culture     Status: Abnormal   Collection Time: 05/05/16  2:19 AM  Result Value Ref Range Status   Specimen Description URINE, CATHETERIZED  Final    Special Requests NONE  Final   Culture >=100,000 COLONIES/mL KLEBSIELLA PNEUMONIAE (A)  Final   Report Status 05/08/2016 FINAL  Final   Organism ID, Bacteria KLEBSIELLA PNEUMONIAE (A)  Final      Susceptibility   Klebsiella pneumoniae - MIC*    AMPICILLIN >=32 RESISTANT Resistant     CEFAZOLIN <=4 SENSITIVE Sensitive     CEFTRIAXONE <=1 SENSITIVE Sensitive     CIPROFLOXACIN <=0.25 SENSITIVE Sensitive     GENTAMICIN <=1 SENSITIVE Sensitive     IMIPENEM <=0.25 SENSITIVE Sensitive     NITROFURANTOIN 64 INTERMEDIATE Intermediate     TRIMETH/SULFA <=20 SENSITIVE Sensitive     AMPICILLIN/SULBACTAM >=32 RESISTANT Resistant     PIP/TAZO 16 SENSITIVE Sensitive     * >=100,000 COLONIES/mL KLEBSIELLA PNEUMONIAE  Culture, blood (Routine X 2) w Reflex to ID Panel     Status: None   Collection Time: 05/05/16  3:34 AM  Result Value Ref Range Status   Specimen Description BLOOD RIGHT ARM  Final   Special Requests AEROBIC BOTTLE ONLY 10ML ROCEPHIN  Final   Culture NO GROWTH 5 DAYS  Final   Report Status 05/10/2016 FINAL  Final  Culture, blood (Routine X 2) w Reflex to ID Panel     Status: None   Collection Time: 05/05/16  3:42 AM  Result Value Ref Range Status   Specimen Description BLOOD RIGHT HAND  Final   Special Requests IN PEDIATRIC BOTTLE 4ML ROCEPHIN  Final   Culture NO GROWTH 5 DAYS  Final   Report Status 05/10/2016 FINAL  Final  Body fluid culture     Status: Abnormal   Collection Time: 05/07/16 10:37 AM  Result Value Ref Range Status   Specimen Description FLUID  Final   Special Requests BILIARY DRAIN  Final   Gram Stain   Final    ABUNDANT WBC PRESENT, PREDOMINANTLY PMN NO ORGANISMS SEEN    Culture (A)  Final    PANTOEA SPECIES ENTEROCOCCUS FAECALIS VIRIDANS STREPTOCOCCUS    Report Status 05/11/2016 FINAL  Final   Organism ID, Bacteria PANTOEA SPECIES  Final   Organism  ID, Bacteria ENTEROCOCCUS FAECALIS  Final      Susceptibility   Enterococcus faecalis - MIC*     AMPICILLIN <=2 SENSITIVE Sensitive     VANCOMYCIN 1 SENSITIVE Sensitive     GENTAMICIN SYNERGY RESISTANT Resistant     * ENTEROCOCCUS FAECALIS   Pantoea species - MIC*    CEFAZOLIN <=4 SENSITIVE Sensitive     CEFEPIME <=1 SENSITIVE Sensitive     CEFTAZIDIME <=1 SENSITIVE Sensitive     CEFTRIAXONE <=1 SENSITIVE Sensitive     CIPROFLOXACIN <=0.25 SENSITIVE Sensitive     GENTAMICIN <=1 SENSITIVE Sensitive     IMIPENEM <=0.25 SENSITIVE Sensitive     TRIMETH/SULFA <=20 SENSITIVE Sensitive     PIP/TAZO <=4 SENSITIVE Sensitive     * PANTOEA SPECIES    Radiology Studies: No results found.    Scheduled Meds: . aspirin EC  81 mg Oral Daily  . carvedilol  25 mg Oral BID WC  . citalopram  20 mg Oral Daily  . enoxaparin (LOVENOX) injection  40 mg Subcutaneous Q24H  . ezetimibe  10 mg Oral Daily  . feeding supplement (PRO-STAT SUGAR FREE 64)  30 mL Oral BID  . furosemide  20 mg Oral Daily  . insulin aspart  0-9 Units Subcutaneous Q4H  . insulin glargine  8 Units Subcutaneous BID  . lactulose  20 g Oral Daily  . piperacillin-tazobactam (ZOSYN)  IV  3.375 g Intravenous Q8H  . pregabalin  200 mg Oral BID  . rOPINIRole  2 mg Oral BID   Continuous Infusions: . sodium chloride 100 mL/hr at 05/11/16 0755     LOS: 6 days   Time spent: 25 minutes   Vance Gather, MD [facility] Pager [phone]   If 7PM-7AM, please contact night-coverage [email or web address] Password [email or web address] 05/11/2016, 2:30 PM

## 2016-05-11 NOTE — Final Consult Note (Addendum)
Consultant Final Sign-Off Note    Assessment/Final recommendations  Melissa Myers is a 71 y.o. female followed by me for pancreatici head mass. She underwent PTC for obstructive symptoms. Her bili is slowly improving. She has marginal health and nutrition status but may undergo resection in the future.   Wound care (if applicable):    Diet at discharge: diabetic diet with protein supplementation   Activity at discharge: per primary team   Follow-up appointment:  Dr. Barry Dienes on 4/4  Pending results:  Unresulted Labs    Start     Ordered   05/12/16 0500  Creatinine, serum  (enoxaparin (LOVENOX)    CrCl >/= 30 ml/min)  Weekly,   R    Comments:  while on enoxaparin therapy    05/05/16 0645   05/11/16 0500  Comprehensive metabolic panel  Daily,   R     05/10/16 1730       Medication recommendations:   Other recommendations:    Thank you for allowing [place] to participate in the care of your patient!  Please consult [place] again if you have further needs for your patient.  Arta Bruce Nasire Reali 05/11/2016 7:58 AM    Subjective     Objective  Vital signs in last 24 hours: Temp:  [97.4 F (36.3 C)-97.8 F (36.6 C)] 97.6 F (36.4 C) (03/21 0529) Pulse Rate:  [48-51] 51 (03/21 0529) Resp:  [18-20] 18 (03/21 0529) BP: (132-154)/(47-55) 149/52 (03/21 0529) SpO2:  [98 %-100 %] 99 % (03/21 0529) Weight:  [111.4 kg (245 lb 9.5 oz)] 111.4 kg (245 lb 9.5 oz) (03/21 0529)  General: NAD   Pertinent labs and Studies:  Recent Labs  05/09/16 0419 05/10/16 0533 05/11/16 0719  WBC 6.4 6.5 6.9  HGB 8.3* 8.4* 8.4*  HCT 26.8* 27.0* 27.0*   BMET  Recent Labs  05/09/16 0419 05/10/16 0533  NA 137 140  K 3.6 3.4*  CL 96* 95*  CO2 32 34*  GLUCOSE 173* 145*  BUN 20 11  CREATININE 0.98 0.84  CALCIUM 8.4* 8.9   No results for input(s): LABURIN in the last 72 hours. Results for orders placed or performed during the hospital encounter of 05/04/16  Urine culture      Status: Abnormal   Collection Time: 05/05/16  2:19 AM  Result Value Ref Range Status   Specimen Description URINE, CATHETERIZED  Final   Special Requests NONE  Final   Culture >=100,000 COLONIES/mL KLEBSIELLA PNEUMONIAE (A)  Final   Report Status 05/08/2016 FINAL  Final   Organism ID, Bacteria KLEBSIELLA PNEUMONIAE (A)  Final      Susceptibility   Klebsiella pneumoniae - MIC*    AMPICILLIN >=32 RESISTANT Resistant     CEFAZOLIN <=4 SENSITIVE Sensitive     CEFTRIAXONE <=1 SENSITIVE Sensitive     CIPROFLOXACIN <=0.25 SENSITIVE Sensitive     GENTAMICIN <=1 SENSITIVE Sensitive     IMIPENEM <=0.25 SENSITIVE Sensitive     NITROFURANTOIN 64 INTERMEDIATE Intermediate     TRIMETH/SULFA <=20 SENSITIVE Sensitive     AMPICILLIN/SULBACTAM >=32 RESISTANT Resistant     PIP/TAZO 16 SENSITIVE Sensitive     * >=100,000 COLONIES/mL KLEBSIELLA PNEUMONIAE  Culture, blood (Routine X 2) w Reflex to ID Panel     Status: None   Collection Time: 05/05/16  3:34 AM  Result Value Ref Range Status   Specimen Description BLOOD RIGHT ARM  Final   Special Requests AEROBIC BOTTLE ONLY 10ML ROCEPHIN  Final   Culture NO GROWTH  5 DAYS  Final   Report Status 05/10/2016 FINAL  Final  Culture, blood (Routine X 2) w Reflex to ID Panel     Status: None   Collection Time: 05/05/16  3:42 AM  Result Value Ref Range Status   Specimen Description BLOOD RIGHT HAND  Final   Special Requests IN PEDIATRIC BOTTLE 4ML ROCEPHIN  Final   Culture NO GROWTH 5 DAYS  Final   Report Status 05/10/2016 FINAL  Final  Body fluid culture     Status: Abnormal (Preliminary result)   Collection Time: 05/07/16 10:37 AM  Result Value Ref Range Status   Specimen Description FLUID  Final   Special Requests BILIARY DRAIN  Final   Gram Stain   Final    ABUNDANT WBC PRESENT, PREDOMINANTLY PMN NO ORGANISMS SEEN    Culture PANTOEA SPECIES ENTEROCOCCUS FAECALIS  (A)  Final   Report Status PENDING  Incomplete   Organism ID, Bacteria PANTOEA  SPECIES  Final   Organism ID, Bacteria ENTEROCOCCUS FAECALIS  Final      Susceptibility   Enterococcus faecalis - MIC*    AMPICILLIN <=2 SENSITIVE Sensitive     VANCOMYCIN 1 SENSITIVE Sensitive     GENTAMICIN SYNERGY RESISTANT Resistant     * ENTEROCOCCUS FAECALIS   Pantoea species - MIC*    CEFAZOLIN <=4 SENSITIVE Sensitive     CEFEPIME <=1 SENSITIVE Sensitive     CEFTAZIDIME <=1 SENSITIVE Sensitive     CEFTRIAXONE <=1 SENSITIVE Sensitive     CIPROFLOXACIN <=0.25 SENSITIVE Sensitive     GENTAMICIN <=1 SENSITIVE Sensitive     IMIPENEM <=0.25 SENSITIVE Sensitive     TRIMETH/SULFA <=20 SENSITIVE Sensitive     PIP/TAZO <=4 SENSITIVE Sensitive     * PANTOEA SPECIES   *Note: Due to a large number of results and/or encounters for the requested time period, some results have not been displayed. A complete set of results can be found in Results Review.    Imaging: No results found.

## 2016-05-12 DIAGNOSIS — Z7189 Other specified counseling: Secondary | ICD-10-CM

## 2016-05-12 DIAGNOSIS — Z515 Encounter for palliative care: Secondary | ICD-10-CM

## 2016-05-12 LAB — GLUCOSE, CAPILLARY
Glucose-Capillary: 144 mg/dL — ABNORMAL HIGH (ref 65–99)
Glucose-Capillary: 122 mg/dL — ABNORMAL HIGH (ref 65–99)
Glucose-Capillary: 114 mg/dL — ABNORMAL HIGH (ref 65–99)
Glucose-Capillary: 125 mg/dL — ABNORMAL HIGH (ref 65–99)
Glucose-Capillary: 187 mg/dL — ABNORMAL HIGH (ref 65–99)

## 2016-05-12 LAB — COMPREHENSIVE METABOLIC PANEL
ALBUMIN: 1.7 g/dL — AB (ref 3.5–5.0)
ALK PHOS: 595 U/L — AB (ref 38–126)
ALT: 27 U/L (ref 14–54)
AST: 32 U/L (ref 15–41)
Anion gap: 9 (ref 5–15)
BILIRUBIN TOTAL: 2.6 mg/dL — AB (ref 0.3–1.2)
BUN: 5 mg/dL — AB (ref 6–20)
CO2: 30 mmol/L (ref 22–32)
Calcium: 8.5 mg/dL — ABNORMAL LOW (ref 8.9–10.3)
Chloride: 101 mmol/L (ref 101–111)
Creatinine, Ser: 0.61 mg/dL (ref 0.44–1.00)
GFR calc Af Amer: >60 mL/min (ref >60)
GFR calc non Af Amer: >60 mL/min (ref >60)
GLUCOSE: 150 mg/dL — AB (ref 65–99)
POTASSIUM: 3.3 mmol/L — AB (ref 3.5–5.1)
SODIUM: 140 mmol/L (ref 135–145)
TOTAL PROTEIN: 5.5 g/dL — AB (ref 6.5–8.1)

## 2016-05-12 MED ORDER — ENSURE ENLIVE PO LIQD
237.0000 mL | Freq: Three times a day (TID) | ORAL | Status: DC
  Administered 2016-05-12 – 2016-05-13 (×4): 237 mL via ORAL

## 2016-05-12 MED ORDER — POTASSIUM CHLORIDE CRYS ER 20 MEQ PO TBCR
20.0000 meq | EXTENDED_RELEASE_TABLET | Freq: Once | ORAL | Status: AC
  Administered 2016-05-12: 20 meq via ORAL
  Filled 2016-05-12: qty 1

## 2016-05-12 NOTE — Progress Notes (Signed)
Physical Therapy Treatment Patient Details Name: Melissa Myers MRN: [id number] DOB: Jul 04, 1945 Today's Date: 05/12/2016    History of Present Illness 71 y.o.femalewho presented to the EDwith fever, abdominal pain, malaise, confusion, admitted with cholangitis, recurrent jaundice. On 3/16 had ERCP, clogged stent removed, unable to pass wire and place new stent across severe malignant stricture. 3/17 s/p int/ext biliary drain placed. PMH: diabetes, restless leg syndrome, neuropathy, HTN, CHF.    PT Comments    Pt progressing well this pm and performed sit to stand with sara stedy and min A.  Plan next session for sit to stand trials with RW and advancement to gait training if patient can tolerate.  Will continue to recommend SNF placement to improve strength and functional mobility before returning home.      Follow Up Recommendations  SNF;Supervision/Assistance - 24 hour     Equipment Recommendations  None recommended by PT    Recommendations for Other Services       Precautions / Restrictions Precautions Precautions: Fall Restrictions Weight Bearing Restrictions: No    Mobility  Bed Mobility   Bed Mobility: Supine to Sit Rolling: Mod assist   Supine to sit: Mod assist     General bed mobility comments: Cues for LE advance and assist to elevate trunk into sitting.    Transfers Overall transfer level: Needs assistance Equipment used:  (stedy )   Sit to Stand: Min assist         General transfer comment: Cues for hand placement to pull into standing.  Pt able to maintain static stance while PTA moved stedy plates behind her bottom.  pt tolerated standing well with good eccentric loading when returning to the chair.    Ambulation/Gait Ambulation/Gait assistance:  (NT)               Stairs            Wheelchair Mobility    Modified Rankin (Stroke Patients Only)       Balance Overall balance assessment: Needs assistance   Sitting  balance-Leahy Scale: Fair       Standing balance-Leahy Scale: Poor                      Cognition Arousal/Alertness: Awake/alert Behavior During Therapy: WFL for tasks assessed/performed Overall Cognitive Status: Within Functional Limits for tasks assessed                      Exercises      General Comments        Pertinent Vitals/Pain Pain Assessment: Faces Faces Pain Scale: Hurts even more Pain Location: bil knees, back Pain Descriptors / Indicators: Aching;Grimacing;Moaning Pain Intervention(s): Monitored during session;Repositioned    Home Living                      Prior Function            PT Goals (current goals can now be found in the care plan section) Acute Rehab PT Goals Patient Stated Goal: get better Potential to Achieve Goals: Fair Progress towards PT goals: Progressing toward goals    Frequency    Min 2X/week      PT Plan Current plan remains appropriate    Co-evaluation             End of Session Equipment Utilized During Treatment: Oxygen (sara stedy) Activity Tolerance: Patient tolerated treatment well Patient left: in chair;with call bell/phone within reach;with  chair alarm set Nurse Communication: Mobility status PT Visit Diagnosis: Unsteadiness on feet (R26.81);Muscle weakness (generalized) (M62.81)     Time: 8478-4128 PT Time Calculation (min) (ACUTE ONLY): 19 min  Charges:  $Therapeutic Activity: 8-22 mins                    G Codes:       Cristela Blue 13-May-2016, 5:35 PM Governor Rooks, PTA pager [phone]

## 2016-05-12 NOTE — Consult Note (Signed)
   [facility] CM Inpatient Consult   05/12/2016  Melissa Myers 05/03/1945 [id number]  Patient evaluated for re-admission as a beneficiary of the [place]. .  Chart review reveals the patient is a 71 yo female with DM2 and complications of neuropathy, OSA, declines CPAP but home oxygen prn, s/p cholecystectomy 1997, tubular adenoma on 11/2012 Colonoscopy. 2009 ERCP with 12 mm sphincterotomy due to jaundice, with ? filling defect but no stones on bile duct sweep. Recent admission 1/23 until 1/29 for obstructive jaundice, 03/18/16 ERCPwith biliary stent placement across malignant appearing distal CBD stricture now with 2.9 cm mass in pancreatic uncinate obstructing main PD and bile duct.  This is the patient's 2nd admission and currently being followed by Palliative Care team for goals of care. Patient had previously been followed by [facility] program through [facility] prior to admission. Spoke with [name] at [facility] to confirm prior program.  Patient's disposition is currently for a skilled nursing facility for rehab and confirmed with [name] that [facility] will not follow at the skilled facility on for home based program.  Went by to speak with patient and husband.  Patient was sleeping soundly and no family at the bedside. Did not disturb. Patient will receive post hospital care at the skilled level at this time. No [facility] Care Management needs noted at this time.  For questions or referrals please contact:  Natividad Brood, RN BSN [facility] Liaison  [phone] business mobile phone Toll free office [phone]

## 2016-05-12 NOTE — Progress Notes (Signed)
Patient ID: Melissa Myers, female   DOB: 02/04/1946, 71 y.o.   MRN: [id number]    PROGRESS NOTE  Melissa Myers  GHW:[id number] DOB: 21-Sep-1945 DOA: 05/04/2016  PCP: [name], MD   Brief Narrative:  Melissa Myers is a 71 yo female with a history of IDDM with neuropathy, HFpEF, HTN, OSA on intermittent home oxygen, and recently diagnosed pancreatic mass s/p biliary stent 04/28/2016 who presented 3/14 with fever, abdominal pain and jaundice. She was diagnosed with cholangitis, recurrent jaundice, and zosyn was started. ERCP on 3/16 showed a clogged stent which was removed though new stent was not able to be placed across malignant stricture. On 3/17 a 10Fr int/ext biliary drain was placed with aspiration of purulent bile. Blood cultures remained negative, though bile culture grew pantoea species, enterococcus faecalis. Urine culture had also grown Klebsiella, sensitive to zosyn. Obstructive jaundice has significantly improved with perc drain placed by IR.   History includes: - 2009 ERCP with 12 mm sphincterotomy due to jaundice, with ? filling defect but no stones on bile duct sweep. - Admission 1/23 until 1/29 for obstructive jaundice, 03/18/16 ERCP with biliary stent placement across malignant-appearing distal CBD stricture. CA 19-9: 107 - EUS 04/28/16: stent in good position. 2.9 cm mass in pancreatic uncinate obstructing main PD and bile duct.  Assessment & Plan: Pancreatic cancer with obstructive jaundice, bacterial cholangitis: s/p removal of clogged stent 3/16 at ERCP with unsuccessful effort at restenting. Had perc drain placed 3/17 by IR. Bile culture grew pantoea and Enterococcus.  - Discussed with pharmacy and according to the [facility] guide and local antibiogram, zosyn will be sufficient for both in addition to Klebsiella UTI. Continue zosyn.  - Discussed with Dr. Burr Medico, who saw the patient 3/22. Not a good srugical candidate for resection, nor for systemic  chemotherapy, though may be able to offer palliative radiation. Pt to follow up with Dr. Burr Medico after discharge.  - Surgery consulted regarding surgical options, i.e. [facility]: Her current functional and nutrition status makes her a poor candidate. Will follow up with Dr. Barry Dienes 4/4.  - Monitoring bilirubin daily, trending downward.  - Due to overall poor prognosis, will have palliative care team get involved   Toxic metabolic encephalopathy: Resolved with lactulose, presumably was due to elevated ammonia. CT head unremarkable.  - Continue lactulose 20g daily, hold for diarrhea. Has been effective in clearing mentation.   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 Mostly at inpatient goal. - Continue lantus and SSI - Monitor closely with pick up in per oral intake.                  Acute kidney injury: Prerenal, resolved. - Monitor  Chronic HFpEF: Last echo 11/2015 with EF 65%, G1DD.  - Daily weights stable 245-248lbs - Appears euvolemic, holding both IVF and lasix 3/22 and monitoring closely.  Bradycardia: Asymptomatic. - Hold parameters placed on beta blocker.   OSA - CPAP qHS (has been declined by pt)  Klebsiella UTI - Zosyn started 3/15, having likely completed treatment for UTI indication.  Hypokalemia: Mild. Presumably from perc tube losses? - Monitor, reorder 56mEq today for K of 3.3.   Hyponatremia: Resolved.   Morbid obesity BMI is 51.97kg/m. - May make malnutrition more difficult to assess by habitus  DVT prophylaxis:  Lovenox Code Status: Full  Family Communication: None at bedside this AM Disposition Plan: Anticipate eventual DC to SNF based on level of debility.   Consultants:  GI  IR  Surgery   Procedures:   1/26 ERCP with sphincterotomy and stent placement across malignant CBD stricture.    3/8 EUS.  Mass (adenocarcinoma) at uncinate.    3/15 admission with cholangitis, recurrent jaundice. Zosyn  started  3/16 ERCP.  Clogged stent removed, unable to pass wire and place new stent across severe malignant stricture.   3/17 Dr Laurence Ferrari placed 66fr int/ext biliary drain, aspirated purulent bile. Blood cx's negative. Bile culture grew pantoea species, enterococcus faecalis.    Antimicrobials:   Zosyn 3/15 >  Subjective: Alert, still reports being sleepy but mentating at baseline. Wants to go home, but understands rationale behind PT recommendation for SNF. 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 No pain. Tolerating diet.   Objective: Vitals:   05/11/16 0529 05/11/16 1431 05/11/16 2230 05/12/16 0510  BP: (!) 149/52 (!) 131/55 (!) 148/58 (!) 141/53  Pulse: (!) 51 (!) 50 (!) 48 (!) 49  Resp: 18 20 18 18   Temp: 97.6 F (36.4 C) 98.4 F (36.9 C) 97.5 F (36.4 C) 97.5 F (36.4 C)  TempSrc:  Oral Oral   SpO2: 99% 99% 91% 94%  Weight: 111.4 kg (245 lb 9.5 oz)   112.8 kg (248 lb 10.9 oz)  Height:        Intake/Output Summary (Last 24 hours) at 05/12/16 1205 Last data filed at 05/12/16 1000  Gross per 24 hour  Intake             1670 ml  Output              350 ml  Net             1320 ml   Filed Weights   05/10/16 0505 05/11/16 0529 05/12/16 0510  Weight: 111.6 kg (246 lb 0.5 oz) 111.4 kg (245 lb 9.5 oz) 112.8 kg (248 lb 10.9 oz)    Examination:  General exam: Morbidly obese 71yo F in no distress, conversant and pleasant Respiratory system: Respiratory effort normal. Diminished breath sounds at bases  Cardiovascular system: S1 & S2 heard, RRR. No JVD, murmurs, rubs, gallops or clicks. Trace bilateral pitting edema. Gastrointestinal system: Abdomen is obese, not tender today, not distended. Right abdomen with perc drain draining dark green fluid. Skin: Right abdomen dressing c/d/i. No further significant jaundice. CNS: Alert and oriented completely. No focal deficits, but very diffusely weak.   Data Reviewed: I have personally reviewed following labs and  imaging studies  CBC:  Recent Labs Lab 05/07/16 0808 05/08/16 0535 05/09/16 0419 05/10/16 0533 05/11/16 0719  WBC 5.9 7.2 6.4 6.5 6.9  HGB 8.8* 8.5* 8.3* 8.4* 8.4*  HCT 28.2* 27.1* 26.8* 27.0* 27.0*  MCV 89.8 88.9 88.7 87.7 89.4  PLT 275 308 325 374 341*   Basic Metabolic Panel:  Recent Labs Lab 05/08/16 0535 05/09/16 0419 05/10/16 0533 05/11/16 0719 05/12/16 0423  NA 134* 137 140 142 140  K 3.6 3.6 3.4* 3.4* 3.3*  CL 96* 96* 95* 101 101  CO2 32 32 34* 32 30  GLUCOSE 198* 173* 145* 131* 150*  BUN 19 20 11 7  5*  CREATININE 0.97 0.98 0.84 0.70 0.61  CALCIUM 8.0* 8.4* 8.9 8.6* 8.5*   Liver Function Tests:  Recent Labs Lab 05/08/16 0535 05/09/16 0419 05/10/16 0533 05/11/16 0719 05/12/16 0423  AST 112* 72* 46* 36 32  ALT 53 44 36 30 27  ALKPHOS 745* 673* 666* 593* 595*  BILITOT 9.0* 6.3* 4.5* 3.2* 2.6*  PROT  5.2* 5.3* 5.8* 5.1* 5.5*  ALBUMIN 1.5* 1.5* 1.6* 1.6* 1.7*   No results for input(s): LIPASE, AMYLASE in the last 168 hours. No results for input(s): AMMONIA in the last 168 hours. Coagulation Profile: No results for input(s): INR, PROTIME in the last 168 hours. Cardiac Enzymes: No results for input(s): CKTOTAL, CKMB, CKMBINDEX, TROPONINI in the last 168 hours. BNP (last 3 results)  Recent Labs  11/04/15 0956  PROBNP 20.0   CBG:  Recent Labs Lab 05/11/16 1604 05/11/16 2002 05/11/16 2355 05/12/16 0402 05/12/16 0726  GLUCAP 165* 215* 180* 144* 122*   Urine analysis:    Component Value Date/Time   COLORURINE YELLOW 05/05/2016 0219   APPEARANCEUR CLEAR 05/05/2016 0219   LABSPEC 1.007 05/05/2016 0219   PHURINE 5.0 05/05/2016 0219   GLUCOSEU >=500 (A) 05/05/2016 0219   GLUCOSEU NEGATIVE 11/04/2015 0956   HGBUR NEGATIVE 05/05/2016 0219   BILIRUBINUR NEGATIVE 05/05/2016 0219   BILIRUBINUR neg 08/17/2015 1547   KETONESUR NEGATIVE 05/05/2016 0219   PROTEINUR NEGATIVE 05/05/2016 0219   UROBILINOGEN 0.2 11/04/2015 0956   NITRITE POSITIVE  (A) 05/05/2016 0219   LEUKOCYTESUR TRACE (A) 05/05/2016 0219   Recent Results (from the past 240 hour(s))  Urine culture     Status: Abnormal   Collection Time: 05/05/16  2:19 AM  Result Value Ref Range Status   Specimen Description URINE, CATHETERIZED  Final   Special Requests NONE  Final   Culture >=100,000 COLONIES/mL KLEBSIELLA PNEUMONIAE (A)  Final   Report Status 05/08/2016 FINAL  Final   Organism ID, Bacteria KLEBSIELLA PNEUMONIAE (A)  Final      Susceptibility   Klebsiella pneumoniae - MIC*    AMPICILLIN >=32 RESISTANT Resistant     CEFAZOLIN <=4 SENSITIVE Sensitive     CEFTRIAXONE <=1 SENSITIVE Sensitive     CIPROFLOXACIN <=0.25 SENSITIVE Sensitive     GENTAMICIN <=1 SENSITIVE Sensitive     IMIPENEM <=0.25 SENSITIVE Sensitive     NITROFURANTOIN 64 INTERMEDIATE Intermediate     TRIMETH/SULFA <=20 SENSITIVE Sensitive     AMPICILLIN/SULBACTAM >=32 RESISTANT Resistant     PIP/TAZO 16 SENSITIVE Sensitive     * >=100,000 COLONIES/mL KLEBSIELLA PNEUMONIAE  Culture, blood (Routine X 2) w Reflex to ID Panel     Status: None   Collection Time: 05/05/16  3:34 AM  Result Value Ref Range Status   Specimen Description BLOOD RIGHT ARM  Final   Special Requests AEROBIC BOTTLE ONLY 10ML ROCEPHIN  Final   Culture NO GROWTH 5 DAYS  Final   Report Status 05/10/2016 FINAL  Final  Culture, blood (Routine X 2) w Reflex to ID Panel     Status: None   Collection Time: 05/05/16  3:42 AM  Result Value Ref Range Status   Specimen Description BLOOD RIGHT HAND  Final   Special Requests IN PEDIATRIC BOTTLE 4ML ROCEPHIN  Final   Culture NO GROWTH 5 DAYS  Final   Report Status 05/10/2016 FINAL  Final  Body fluid culture     Status: Abnormal   Collection Time: 05/07/16 10:37 AM  Result Value Ref Range Status   Specimen Description FLUID  Final   Special Requests BILIARY DRAIN  Final   Gram Stain   Final    ABUNDANT WBC PRESENT, PREDOMINANTLY PMN NO ORGANISMS SEEN    Culture (A)  Final     PANTOEA SPECIES ENTEROCOCCUS FAECALIS VIRIDANS STREPTOCOCCUS    Report Status 05/11/2016 FINAL  Final   Organism ID, Bacteria PANTOEA SPECIES  Final  Organism ID, Bacteria ENTEROCOCCUS FAECALIS  Final      Susceptibility   Enterococcus faecalis - MIC*    AMPICILLIN <=2 SENSITIVE Sensitive     VANCOMYCIN 1 SENSITIVE Sensitive     GENTAMICIN SYNERGY RESISTANT Resistant     * ENTEROCOCCUS FAECALIS   Pantoea species - MIC*    CEFAZOLIN <=4 SENSITIVE Sensitive     CEFEPIME <=1 SENSITIVE Sensitive     CEFTAZIDIME <=1 SENSITIVE Sensitive     CEFTRIAXONE <=1 SENSITIVE Sensitive     CIPROFLOXACIN <=0.25 SENSITIVE Sensitive     GENTAMICIN <=1 SENSITIVE Sensitive     IMIPENEM <=0.25 SENSITIVE Sensitive     TRIMETH/SULFA <=20 SENSITIVE Sensitive     PIP/TAZO <=4 SENSITIVE Sensitive     * PANTOEA SPECIES    Radiology Studies: No results found.  Scheduled Meds: . aspirin EC  81 mg Oral Daily  . carvedilol  25 mg Oral BID WC  . citalopram  20 mg Oral Daily  . enoxaparin (LOVENOX) injection  40 mg Subcutaneous Q24H  . ezetimibe  10 mg Oral Daily  . feeding supplement (PRO-STAT SUGAR FREE 64)  30 mL Oral BID  . insulin aspart  0-9 Units Subcutaneous Q4H  . insulin glargine  8 Units Subcutaneous BID  . lactulose  20 g Oral Daily  . piperacillin-tazobactam (ZOSYN)  IV  3.375 g Intravenous Q8H  . pregabalin  200 mg Oral BID  . rOPINIRole  2 mg Oral BID    LOS: 7 days   Time spent: 25 minutes   Vance Gather, MD [facility] Pager [phone]   If 7PM-7AM, please contact night-coverage [email or web address] Password [facility] 05/12/2016, 12:05 PM

## 2016-05-12 NOTE — Care Management Important Message (Signed)
Important Message  Patient Details  Name: Melissa Myers MRN: [id number] Date of Birth: Apr 06, 1945   Medicare Important Message Given:  Yes    [name] May 05/12/2016, 1:07 PM

## 2016-05-12 NOTE — Progress Notes (Addendum)
Nutrition Follow-up  DOCUMENTATION CODES:   Obesity unspecified  INTERVENTION:   -Ensure Enlive po TID, each supplement provides 350 kcal and 20 grams of protein  NUTRITION DIAGNOSIS:   Inadequate oral intake related to lethargy/confusion as evidenced by meal completion < 25%.  GOAL:   Patient will meet greater than or equal to 90% of their needs  MONITOR:   PO intake, Supplement acceptance, Labs, Weight trends, Skin, I & O's  REASON FOR ASSESSMENT:   Consult Assessment of nutrition requirement/status  ASSESSMENT:   Melissa Myers is a 71 y.o. female with a past medical history significant for IDDM, HFpEF, HTN, and recent pancreatic mass followed by [facility] underwent biliary stent 04/28/16 who presents with fever, abdominal pain, malaise, confusion.  3/16- s/p ERCP, which revealed biliary stricture with malignancy and underwent biliary stent removal  3/17- s/p Percutaneous transhepatic cholangiogram and placement of a 70F internal/external biliary drain.   Aspirated purulent bile sent for culture.  Pt underwent oncology consultation; pancreatic cancer is probably resectable, however, is a poor surgical candidate. Pt may undergo palliative radiation.  Took call from PCT NP, Sarah. She reports that pt is much more alert today and is eating more than in previous assessment. Pt is requesting chocolate protein drinks 2-3 times per day. Meal completion 5-95%. RD to order.   Labs reviewed: K: 3.3, CBGS: 114-180.   Diet Order:  Diet Carb Modified Fluid consistency: Thin; Room service appropriate? Yes  Skin:  Reviewed, no issues  Last BM:  05/12/16  Height:   Ht Readings from Last 1 Encounters:  05/04/16 4\' 10"  (1.473 m)    Weight:   Wt Readings from Last 1 Encounters:  05/12/16 248 lb 10.9 oz (112.8 kg)    Ideal Body Weight:  44.1 kg  BMI:  Body mass index is 51.97 kg/m.  Estimated Nutritional Needs:   Kcal:  1350-1550  Protein:  90-105 grams  Fluid:   >1.5 L  EDUCATION NEEDS:   Education needs addressed  Arturo Freundlich A. Jimmye Norman, RD, LDN, CDE Pager: [phone] After hours Pager: [phone]

## 2016-05-13 DIAGNOSIS — I5032 Chronic diastolic (congestive) heart failure: Secondary | ICD-10-CM | POA: Diagnosis not present

## 2016-05-13 DIAGNOSIS — C250 Malignant neoplasm of head of pancreas: Secondary | ICD-10-CM | POA: Diagnosis not present

## 2016-05-13 DIAGNOSIS — M6281 Muscle weakness (generalized): Secondary | ICD-10-CM | POA: Diagnosis not present

## 2016-05-13 DIAGNOSIS — R2689 Other abnormalities of gait and mobility: Secondary | ICD-10-CM | POA: Diagnosis not present

## 2016-05-13 DIAGNOSIS — E1142 Type 2 diabetes mellitus with diabetic polyneuropathy: Secondary | ICD-10-CM | POA: Diagnosis not present

## 2016-05-13 DIAGNOSIS — E785 Hyperlipidemia, unspecified: Secondary | ICD-10-CM | POA: Diagnosis not present

## 2016-05-13 DIAGNOSIS — R41841 Cognitive communication deficit: Secondary | ICD-10-CM | POA: Diagnosis not present

## 2016-05-13 DIAGNOSIS — F339 Major depressive disorder, recurrent, unspecified: Secondary | ICD-10-CM | POA: Diagnosis not present

## 2016-05-13 DIAGNOSIS — K831 Obstruction of bile duct: Secondary | ICD-10-CM | POA: Diagnosis not present

## 2016-05-13 DIAGNOSIS — C801 Malignant (primary) neoplasm, unspecified: Secondary | ICD-10-CM | POA: Diagnosis not present

## 2016-05-13 DIAGNOSIS — Z794 Long term (current) use of insulin: Secondary | ICD-10-CM | POA: Diagnosis not present

## 2016-05-13 DIAGNOSIS — I10 Essential (primary) hypertension: Secondary | ICD-10-CM | POA: Diagnosis not present

## 2016-05-13 DIAGNOSIS — G4730 Sleep apnea, unspecified: Secondary | ICD-10-CM | POA: Diagnosis not present

## 2016-05-13 DIAGNOSIS — K838 Other specified diseases of biliary tract: Secondary | ICD-10-CM | POA: Diagnosis not present

## 2016-05-13 DIAGNOSIS — K830 Cholangitis: Secondary | ICD-10-CM | POA: Diagnosis not present

## 2016-05-13 DIAGNOSIS — E119 Type 2 diabetes mellitus without complications: Secondary | ICD-10-CM | POA: Diagnosis not present

## 2016-05-13 DIAGNOSIS — K630 Abscess of intestine: Secondary | ICD-10-CM | POA: Diagnosis not present

## 2016-05-13 DIAGNOSIS — G894 Chronic pain syndrome: Secondary | ICD-10-CM | POA: Diagnosis not present

## 2016-05-13 LAB — COMPREHENSIVE METABOLIC PANEL
ALT: 24 U/L (ref 14–54)
AST: 30 U/L (ref 15–41)
Albumin: 1.7 g/dL — ABNORMAL LOW (ref 3.5–5.0)
Alkaline Phosphatase: 596 U/L — ABNORMAL HIGH (ref 38–126)
Anion gap: 9 (ref 5–15)
BUN: <5 mg/dL — ABNORMAL LOW (ref 6–20)
CHLORIDE: 101 mmol/L (ref 101–111)
CO2: 29 mmol/L (ref 22–32)
CREATININE: 0.61 mg/dL (ref 0.44–1.00)
Calcium: 8.7 mg/dL — ABNORMAL LOW (ref 8.9–10.3)
GFR calc Af Amer: >60 mL/min (ref >60)
GLUCOSE: 179 mg/dL — AB (ref 65–99)
POTASSIUM: 3.5 mmol/L (ref 3.5–5.1)
SODIUM: 139 mmol/L (ref 135–145)
Total Bilirubin: 2.4 mg/dL — ABNORMAL HIGH (ref 0.3–1.2)
Total Protein: 5.6 g/dL — ABNORMAL LOW (ref 6.5–8.1)

## 2016-05-13 LAB — GLUCOSE, CAPILLARY
GLUCOSE-CAPILLARY: 126 mg/dL — AB (ref 65–99)
GLUCOSE-CAPILLARY: 195 mg/dL — AB (ref 65–99)
Glucose-Capillary: 172 mg/dL — ABNORMAL HIGH (ref 65–99)
Glucose-Capillary: 127 mg/dL — ABNORMAL HIGH (ref 65–99)

## 2016-05-13 MED ORDER — ENSURE ENLIVE PO LIQD: 237.0000 mL | Freq: Three times a day (TID) | ORAL | 12 refills | Status: DC

## 2016-05-13 MED ORDER — HYDROCODONE-ACETAMINOPHEN 10-325 MG PO TABS: 1.0000 | ORAL_TABLET | Freq: Four times a day (QID) | ORAL | 0 refills | Status: DC | PRN

## 2016-05-13 MED ORDER — LACTULOSE 10 GM/15ML PO SOLN: 20.0000 g | Freq: Every day | ORAL | 0 refills | Status: DC

## 2016-05-13 NOTE — Progress Notes (Signed)
Patient will DC to: [facility] Anticipated DC date: 05/13/16 Family notified: Spouse Transport by: Corey Harold   Per MD patient ready for DC to [facility]. RN, patient, patient's family, and facility notified of DC. Discharge Summary sent to facility. RN given number for report. DC packet on chart. Ambulance transport requested for patient.   CSW signing off.  Cedric Fishman, [place] Social Worker [phone]

## 2016-05-13 NOTE — Progress Notes (Signed)
Pt prepared for d/c to SNF. IV d/c'd. Skin intact except as charted in most recent assessments. Vitals are stable. Report called to receiving facility. Pt to be transported by ambulance service. 

## 2016-05-13 NOTE — Progress Notes (Signed)
Referring Physician(s):  Dr. Silvano Rusk  Supervising Physician: Jacqulynn Cadet  Patient Status:  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  States she is trying to prepare for possible discharge.   Allergies: Cymbalta [duloxetine hcl]; Statins; Sulfa antibiotics; Levemir [insulin detemir]; Morphine; and Zinc  Medications: Prior to Admission medications   Medication Sig Start Date End Date Taking? Authorizing Provider  aspirin EC 81 MG tablet Take 1 tablet (81 mg total) by mouth daily. 07/08/14  Yes Rowe Clack, MD  carvedilol (COREG) 25 MG tablet TAKE 1 TABLET BY MOUTH TWICE DAILY WITH FOOD 01/20/16  Yes Jolaine Artist, MD  Cholecalciferol (VITAMIN D3) 2000 units capsule Take 1 capsule (2,000 Units total) by mouth daily. 06/17/15  Yes Janith Lima, MD  citalopram (CELEXA) 20 MG tablet TAKE 1 TABLET (20 MG TOTAL) BY MOUTH DAILY. 01/27/16  Yes [name], MD  DULoxetine (CYMBALTA) 60 MG capsule Take 1 capsule (60 mg total) by mouth daily. 09/25/15  Yes [name], MD  ezetimibe (ZETIA) 10 MG tablet TAKE ONE TABLET BY MOUTH DAILY 02/03/16  Yes [name], MD  gabapentin (NEURONTIN) 300 MG capsule TAKE TWO CAPSULES BY MOUTH FOUR TIMES A DAY 01/19/16  Yes [name], MD  hydrALAZINE (APRESOLINE) 25 MG tablet TAKE 1 TABLET (25 MG TOTAL) BY MOUTH 3 (THREE) TIMES DAILY. 02/16/16  Yes Jolaine Artist, MD  HYDROcodone-acetaminophen (NORCO) 10-325 MG tablet Take 1 tablet by mouth every 6 (six) hours as needed. 04/08/16  Yes [name], MD  insulin aspart (NOVOLOG) 100 UNIT/ML injection Inject 0-20 Units into the skin 3 (three) times daily with meals. . CBG 70 - 120: 0 units CBG 121 - 150: 3 units CBG 151 - 200: 4 units CBG 201 - 250: 7 units CBG 251 - 300: 11 units CBG 301 - 350: 15 units CBG 351 - 400: 20 units 03/21/16  Yes Mariel Aloe, MD    insulin glargine (LANTUS) 100 UNIT/ML injection Inject 0.3 mLs (30 Units total) into the skin daily. 03/22/16  Yes Mariel Aloe, MD  LYRICA 200 MG capsule TAKE ONE CAPSULE BY MOUTH TWICE A DAY 04/07/16  Yes [name], MD  meloxicam (MOBIC) 7.5 MG tablet TAKE ONE TABLET BY MOUTH DAILY 03/11/16  Yes [name], MD  metFORMIN (GLUCOPHAGE-XR) 500 MG 24 hr tablet Take 1,000 mg by mouth daily with breakfast.    Yes Historical Provider, MD  metolazone (ZAROXOLYN) 2.5 MG tablet Take 1 tablet (2.5 mg total) by mouth 2 (two) times a week. 12/17/15  Yes Katrece Friar, PA-C  rOPINIRole (REQUIP XL) 4 MG 24 hr tablet TAKE 1 TABLET (4 MG TOTAL) BY MOUTH AT BEDTIME. 02/23/16  Yes [name], MD  spironolactone (ALDACTONE) 25 MG tablet TAKE ONE TABLET EACH DAY 04/21/16  Yes Larey Dresser, MD  torsemide (DEMADEX) 100 MG tablet Take 1 tablet (100 mg total) by mouth daily. 03/21/16  Yes Mariel Aloe, MD  vitamin B-12 (CYANOCOBALAMIN) 500 MCG tablet Take 500 mcg by mouth daily. Reported on 09/09/2015   Yes Historical Provider, MD     Vital Signs: BP (!) 150/53 (BP Location: Left Arm)   Pulse (!) 49   Temp 97.2 F (36.2 C)   Resp 18   Ht _0  (1.473 m)   Wt 252 lb 10.4 oz (114.6 kg)   SpO2 100%   BMI  52.80 kg/m   Physical Exam  Alert, NAD Abd:  Soft, NT in RUQ, PTC drain in place with drain site c/d/I. Thin green, billious output in bag  Imaging: No results found.  Labs:  CBC:  Recent Labs  05/08/16 0535 05/09/16 0419 05/10/16 0533 05/11/16 0719  WBC 7.2 6.4 6.5 6.9  HGB 8.5* 8.3* 8.4* 8.4*  HCT 27.1* 26.8* 27.0* 27.0*  PLT 308 325 374 410*    COAGS:  Recent Labs  03/15/16 1116 05/05/16 0023  INR 0.94 0.97  APTT 30 32    BMP:  Recent Labs  05/10/16 0533 05/11/16 0719 05/12/16 0423 05/13/16 0413  NA 140 142 140 139  K 3.4* 3.4* 3.3* 3.5  CL 95* 101 101 101  CO2 34* 32 30 29  GLUCOSE 145* 131* 150* 179*  BUN 11 7 5* <5*  CALCIUM  8.9 8.6* 8.5* 8.7*  CREATININE 0.84 0.70 0.61 0.61  GFRNONAA >60 >60 >60 >60  GFRAA >60 >60 >60 >60    LIVER FUNCTION TESTS:  Recent Labs  05/10/16 0533 05/11/16 0719 05/12/16 0423 05/13/16 0413  BILITOT 4.5* 3.2* 2.6* 2.4*  AST 46* 36 32 30  ALT 36 _0 ALKPHOS 666* 593* 595* 596*  PROT 5.8* 5.1* 5.5* 5.6*  ALBUMIN 1.6* 1.6* 1.7* 1.7*    Assessment and Plan: 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  Overall improving and continues to tolerate diet.  Discussed care plan with patient.  Plan is for stent placement in 1-2 weeks.  Schedulers are aware and are working to get patient scheduled for procedure.   Electronically Signed: Docia Barrier 05/13/2016, 12:20 PM   I spent a total of 15 Minutes at the the patient's bedside AND on the patient's hospital floor or unit, greater than 50% of which was counseling/coordinating care for obstructive jaundice.

## 2016-05-13 NOTE — Clinical Social Work Placement (Signed)
   CLINICAL SOCIAL WORK PLACEMENT  NOTE  Date:  05/13/2016  Patient Details  Name: CENIYAH THORP MRN: [id number] Date of Birth: [birth year]  Clinical Social Work is seeking post-discharge placement for this patient at the [place] level of care (*CSW will initial, date and re-position this form in  chart as items are completed):  Yes   Patient/family provided with [facility] Work Department's list of facilities offering this level of care within the geographic area requested by the patient (or if unable, by the patient's family).  Yes   Patient/family informed of their freedom to choose among providers that offer the needed level of care, that participate in Medicare, Medicaid or managed care program needed by the patient, have an available bed and are willing to accept the patient.  Yes   Patient/family informed of [facility]'s ownership interest in [facility] and [facility], as well as of the fact that they are under no obligation to receive care at these facilities.  PASRR submitted to EDS on       PASRR number received on       Existing PASRR number confirmed on 05/13/16     FL2 transmitted to all facilities in geographic area requested by pt/family on 05/13/16     FL2 transmitted to all facilities within larger geographic area on       Patient informed that his/her managed care company has contracts with or will negotiate with certain facilities, including the following:        Yes   Patient/family informed of bed offers received.  Patient chooses bed at [facility]     Physician recommends and patient chooses bed at      Patient to be transferred to [facility] on 05/13/16.  Patient to be transferred to facility by [facility]     Patient family notified on 05/13/16 of transfer.  Name of family member notified:  Spouse     PHYSICIAN       Additional Comment:     _______________________________________________ Benard Halsted, [place] 05/13/2016, 12:51 PM

## 2016-05-13 NOTE — Progress Notes (Signed)
Per MD pt possibly stable for DC today.  Pt and spouse prefer [facility]- they can accept today as long as insurance approves  Pt spouse to go to facility at 1:30pm to do paperwork  Insurance started at 10:30am- awaiting decision  Jorge Ny, [place] Social Worker [phone]

## 2016-05-13 NOTE — Discharge Summary (Signed)
Physician Discharge Summary  [name] FYB:[id number] DOB: January 08, 1946 DOA: 05/04/2016  PCP: [name], MD  Admit date: 05/04/2016 Discharge date: 05/13/2016  Admitted From: Home Disposition: [facility]   Recommendations for Outpatient Follow-up:  1. Follow up with IR in next 1 - 2 weeks for drain re-evaluation 2. Monitor CMP, discharge TBili is 2.4.  3. Palliative to follow at SNF and [facility] to follow at home when discharged.  4. Will follow up with general surgery, Dr. Barry Dienes 4/4 5. Will follow up with oncology, Dr. Burr Medico in next 1-2 weeks with consideration for palliative radiation.   Home Health: N/A Equipment/Devices: Perc biliary drain (3/17) Discharge Condition: Stable CODE STATUS: Full Diet recommendation: Carbohydrate limited, heart healthy.  Brief/Interim Summary: Melissa Myers is a 71 yo female with a history of IDDM with neuropathy, HFpEF, HTN, OSA on intermittent home oxygen, and recently diagnosed pancreatic mass s/p biliary stent 04/28/2016 who presented 3/14 with fever, abdominal pain and jaundice. She was diagnosed with cholangitis, recurrent jaundice, and zosyn was started. ERCP on 3/16 showed a clogged stent which was removed though new stent was not able to be placed across malignant stricture. On 3/17 a 10Fr int/ext biliary drain was placed with aspiration of purulent bile. Blood cultures remained negative, though bile culture grew pantoea species, enterococcus faecalis. Urine culture had also grown Klebsiella, sensitive to zosyn. Obstructive jaundice has significantly improved with perc drain placed by IR. Patient has completed course of zosyn.   History includes: - 2009 ERCP with 12 mm sphincterotomy due to jaundice, with ? filling defect but no stones on bile duct sweep. - Admission 1/23 until 1/29 for obstructive jaundice, 03/18/16 ERCPwith biliary stent placement across malignant-appearing distal CBD stricture. CA 19-9: 107 - EUS  04/28/16: stent in good position.2.9 cm mass in pancreatic uncinate obstructing main PD and bile duct.  Discharge Diagnoses:  Principal Problem:   Cholangitis Active Problems:   Morbid obesity ([place])   Essential hypertension   Chronic diastolic heart failure (HCC)   IDDM (insulin dependent diabetes mellitus) ([place])   Diabetic peripheral neuropathy (HCC)   Pancreatic mass   Malignant neoplasm of head of pancreas (HCC)   Goals of care, counseling/discussion   Palliative care by specialist  Pancreatic cancer with obstructive jaundice, bacterial cholangitis: s/p removal of clogged stent 3/16 at ERCP with unsuccessful effort at restenting. Had perc drain placed 3/17 by IR. Bile culture grew pantoea and Enterococcus.  - Discussed with pharmacy and according to the [facility] guide and local antibiogram, zosyn will be sufficient for both in addition to Klebsiella UTI. Continue zosyn.  - Discussed with Dr. Burr Medico, who saw the patient 3/22. Not a good srugical candidate for resection, nor for systemic chemotherapy, though may be able to offer palliative radiation. Pt to follow up with Dr. Burr Medico, who will discuss the patient at tumor board next week, after discharge.  - Surgery consulted regarding surgical resection: Her current functional and nutrition status makes her a poor candidate. Will follow up with Dr. Barry Dienes 4/4.  - Monitoring bilirubin daily, trending downward.  - Palliative care has seen patient, planning to continue.   Toxic metabolic encephalopathy: Resolved with lactulose, presumably was due to elevated ammonia. CT head unremarkable.  - Continue lactulose 20g daily, hold for diarrhea. Has been effective in clearing mentation.   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 Mostly at inpatient goal. - Continue lantus and SSI - Monitor closely with pick up in per oral  intake.                  Acute kidney injury: Prerenal, resolved. -  Monitor  Chronic HFpEF: Last echo 11/2015 with EF 65%, G1DD.  - Daily weights trended up 245>248>252lbs; 100% on room air. - Appears euvolemic, held IVF and lasix while taking poor po, will restart lasix now.   Bradycardia: Asymptomatic. - Hold parameters placed on beta blocker.   OSA - CPAP qHS (has been declined by pt)  Klebsiella UTI - Zosyn started 3/15, having likely completed treatment for UTI indication.  Hypokalemia: Mild. Presumably from perc tube losses? - Monitor, reorder 8mEq today for K of 3.3.   Hyponatremia: Resolved.   Morbid obesity BMI is 51.97kg/m. - May make malnutrition more difficult to assess by habitus  Discharge Instructions Discharge Instructions    Call MD for:  difficulty breathing, headache or visual disturbances    Complete by:  As directed    Call MD for:  extreme fatigue    Complete by:  As directed    Call MD for:  hives    Complete by:  As directed    Call MD for:  persistant dizziness or light-headedness    Complete by:  As directed    Call MD for:  persistant nausea and vomiting    Complete by:  As directed    Call MD for:  redness, tenderness, or signs of infection (pain, swelling, redness, odor or green/yellow discharge around incision site)    Complete by:  As directed    Call MD for:  severe uncontrolled pain    Complete by:  As directed    Call MD for:  temperature >100.4    Complete by:  As directed    Diet - low sodium heart healthy    Complete by:  As directed    Discharge instructions    Complete by:  As directed    Plan for stent placement in 1-2 weeks.  Schedulers will contact you with appointment time and date.   Increase activity slowly    Complete by:  As directed      Allergies as of 05/13/2016      Reactions   Cymbalta [duloxetine Hcl] Other (See Comments)   Restless leg syndrome   Statins Other (See Comments)   Statin drugs cause muscle pain / "muscle damage"--was told by MD not to take   Sulfa  Antibiotics Diarrhea   Levemir [insulin Detemir] Itching   Morphine Other (See Comments)   GI upset and headaches   Zinc Swelling, Rash      Medication List    STOP taking these medications   meloxicam 7.5 MG tablet Commonly known as:  MOBIC     TAKE these medications   aspirin EC 81 MG tablet Take 1 tablet (81 mg total) by mouth daily.   carvedilol 25 MG tablet Commonly known as:  COREG TAKE 1 TABLET BY MOUTH TWICE DAILY WITH FOOD   citalopram 20 MG tablet Commonly known as:  CELEXA TAKE 1 TABLET (20 MG TOTAL) BY MOUTH DAILY.   DULoxetine 60 MG capsule Commonly known as:  CYMBALTA Take 1 capsule (60 mg total) by mouth daily.   ezetimibe 10 MG tablet Commonly known as:  ZETIA TAKE ONE TABLET BY MOUTH DAILY   feeding supplement (ENSURE ENLIVE) Liqd Take 237 mLs by mouth 3 (three) times daily between meals.   gabapentin 300 MG capsule Commonly known as:  NEURONTIN TAKE TWO CAPSULES BY MOUTH  FOUR TIMES A DAY   hydrALAZINE 25 MG tablet Commonly known as:  APRESOLINE TAKE 1 TABLET (25 MG TOTAL) BY MOUTH 3 (THREE) TIMES DAILY.   HYDROcodone-acetaminophen 10-325 MG tablet Commonly known as:  NORCO Take 1 tablet by mouth every 6 (six) hours as needed.   insulin aspart 100 UNIT/ML injection Commonly known as:  novoLOG Inject 0-20 Units into the skin 3 (three) times daily with meals. . CBG 70 - 120: 0 units CBG 121 - 150: 3 units CBG 151 - 200: 4 units CBG 201 - 250: 7 units CBG 251 - 300: 11 units CBG 301 - 350: 15 units CBG 351 - 400: 20 units   insulin glargine 100 UNIT/ML injection Commonly known as:  LANTUS Inject 0.3 mLs (30 Units total) into the skin daily.   lactulose 10 GM/15ML solution Commonly known as:  CHRONULAC Take 30 mLs (20 g total) by mouth daily. Start taking on:  05/14/2016   LYRICA 200 MG capsule Generic drug:  pregabalin TAKE ONE CAPSULE BY MOUTH TWICE A DAY   metFORMIN 500 MG 24 hr tablet Commonly known as:  GLUCOPHAGE-XR Take 1,000 mg  by mouth daily with breakfast.   metolazone 2.5 MG tablet Commonly known as:  ZAROXOLYN Take 1 tablet (2.5 mg total) by mouth 2 (two) times a week.   rOPINIRole 4 MG 24 hr tablet Commonly known as:  REQUIP XL TAKE 1 TABLET (4 MG TOTAL) BY MOUTH AT BEDTIME.   spironolactone 25 MG tablet Commonly known as:  ALDACTONE TAKE ONE TABLET EACH DAY   torsemide 100 MG tablet Commonly known as:  DEMADEX Take 1 tablet (100 mg total) by mouth daily.   vitamin B-12 500 MCG tablet Commonly known as:  CYANOCOBALAMIN Take 500 mcg by mouth daily. Reported on 09/09/2015   Vitamin D3 2000 units capsule Take 1 capsule (2,000 Units total) by mouth daily.       Contact information for follow-up providers    BYERLY,FAERA, MD Follow up on 05/25/2016.   Specialty:  General Surgery Why:  9:45 AM.  be there 9:15 AM. Contact information: [place] [address] [phone]        Truitt Merle, MD Follow up in 1 week(s).   Specialties:  Hematology, Oncology Contact information: [place] [address] [phone]            Contact information for after-discharge care    Destination    [facility] Follow up.   Specialty:  [place] information: [place] [phone]                 Allergies  Allergen Reactions  . Cymbalta [Duloxetine Hcl] Other (See Comments)    Restless leg syndrome  . Statins Other (See Comments)    Statin drugs cause muscle pain / "muscle damage"--was told by MD not to take  . Sulfa Antibiotics Diarrhea  . Levemir [Insulin Detemir] Itching  . Morphine Other (See Comments)    GI upset and headaches  . Zinc Swelling and Rash    Consultations:  GI  IR  Surgery   Procedures/Studies: Ct Head Wo Contrast  Result Date: 05/05/2016 CLINICAL DATA:  71 year old female with headache and confusion. EXAM: CT HEAD  WITHOUT CONTRAST TECHNIQUE: Contiguous axial images were obtained from the base of the skull through the vertex without intravenous contrast. COMPARISON:  Head CT dated 10/22/2014 FINDINGS: Brain: The ventricles and  sulci appropriate in size for patient's age. Minimal periventricular and deep white matter chronic microvascular ischemic changes noted. There is no acute intracranial hemorrhage. No mass effect or midline shift noted. There is no extra-axial fluid collection. Vascular: No hyperdense vessel or unexpected calcification. Skull: Normal. Negative for fracture or focal lesion. Sinuses/Orbits: No acute finding. Other: None. IMPRESSION: No acute intracranial pathology. Electronically Signed   By: Anner Crete M.D.   On: 05/05/2016 01:21   Ct Chest Wo Contrast  Result Date: 05/07/2016 CLINICAL DATA:  Pancreatic mass, recent bile ducts stent placement EXAM: CT CHEST WITHOUT CONTRAST TECHNIQUE: Multidetector CT imaging of the chest was performed following the standard protocol without IV contrast. COMPARISON:  CT abdomen from 05/05/2016 FINDINGS: Cardiovascular: Aortic arch and left anterior descending coronary artery atherosclerotic calcification. Calcified mitral valve. Mild cardiomegaly. Mediastinum/Nodes: Small paratracheal lymph nodes are present better not appreciably pathologically enlarged. A right hilar node measures 1 cm in short axis on image 56/ 3, borderline prominent. Lungs/Pleura: Scattered bandlike subsegmental atelectasis in both lungs. Centrilobular emphysema. Increased medial atelectasis in the left lower lobe compared to the prior CT abdomen. Small right and trace left pleural effusions with some fluid tracking in the major fissures. Pleural effusions appear new compared to 05/05/16. Upper Abdomen: High density material in the pancreatic duct, probably contrast, only partially included on today' s exam. Musculoskeletal: Subacute healing fracture the right seventh rib laterally. Old  healed right third rib fracture. Thoracic spondylosis and mild thoracic kyphosis. IMPRESSION: 1. New small right and trace left pleural effusions. Mild cardiomegaly. 2. Borderline enlarged right hilar lymph node at 1 cm in short axis. 3. No definite findings of metastatic disease to the chest. 4. Centrilobular emphysema. 5. High density in the visualized part of the dorsal pancreatic duct, probably contrast medium. 6. Subacute healing fracture of the right seventh rib laterally. 7. Atherosclerosis, including the left anterior descending coronary artery. Electronically Signed   By: Van Clines M.D.   On: 05/07/2016 12:03   Ct Abdomen Pelvis W Contrast  Result Date: 05/05/2016 CLINICAL DATA:  Epigastric pain. Hx diabetes, GERD, lumbar disc degeneration, chronic diarrhea, uterine polyp removal, umbilical hernia, tubal ligation, and cholecystectomy. EXAM: CT ABDOMEN AND PELVIS WITH CONTRAST TECHNIQUE: Multidetector CT imaging of the abdomen and pelvis was performed using the standard protocol following bolus administration of intravenous contrast. CONTRAST:  174mL ISOVUE-300 IOPAMIDOL (ISOVUE-300) INJECTION 61% COMPARISON:  ERCP 03/18/2016.  MRI 03/16/2016.  CT 03/15/2016. FINDINGS: Lower chest: Scattered emphysematous changes in the lung bases. Fibrosis or atelectasis in the lung bases. Calcification in the mitral valve annulus and coronary arteries. Hepatobiliary: Since the previous study, there is interval placement of a bile duct stent. Mild residual bile duct dilatation is less prominent than on prior study. Surgical absence of the gallbladder. No focal liver lesions. Pancreas: Diffuse pancreatic ductal dilatation with pancreatic parenchymal atrophy. Mass lesion demonstrated in the head of the pancreas on previous MRI is not well visualized at CT. No inflammatory stranding. Spleen: Focal lesion in the spleen measuring about 6 mm diameter is unchanged since previous study. This probably represents a small  cyst. Additional subcentimeter lesion in the upper spleen is also unchanged. Adrenals/Urinary Tract: Adrenal glands are unremarkable. Kidneys are normal, without renal calculi, focal lesion, or hydronephrosis. Bladder is unremarkable. Stomach/Bowel: Stomach and small bowel are mostly decompressed. No small bowel distention or inflammatory infiltration. Colon is diffusely stool-filled without abnormal distention or inflammation. Scattered colonic diverticula. Appendix is not identified. Vascular/Lymphatic: Aortic atherosclerosis. No enlarged abdominal  or pelvic lymph nodes. Reproductive: Uterus and bilateral adnexa are unremarkable. Other: Small left inguinal hernia containing fat. No free air or free fluid in the abdomen. Musculoskeletal: Degenerative changes in the spine. No destructive bone lesions. IMPRESSION: Interval placement of a bile duct stent with some decompression of the bile ducts. Mild residual biliary dilatation. Pancreatic ductal dilatation and atrophy. Known pancreatic mass lesion is not well depicted at CT. No evidence of bowel obstruction or inflammation. Electronically Signed   By: Lucienne Capers M.D.   On: 05/05/2016 04:37   Dg Chest Port 1 View  Result Date: 05/05/2016 CLINICAL DATA:  Confusion, hyperglycemia, and headache tonight EXAM: PORTABLE CHEST 1 VIEW COMPARISON:  03/28/2016 FINDINGS: Shallow inspiration. The heart size and mediastinal contours are within normal limits. Both lungs are clear. The visualized skeletal structures are unremarkable. IMPRESSION: No active disease. Electronically Signed   By: Lucienne Capers M.D.   On: 05/05/2016 00:04   Dg Ercp  Result Date: 05/06/2016 CLINICAL DATA:  Adenocarcinoma of the pancreas, cholangitis, jaundice and previous endoscopic biliary stent placement. EXAM: ERCP TECHNIQUE: Multiple spot images obtained with the fluoroscopic device and submitted for interpretation post-procedure. COMPARISON:  CT of the abdomen on 05/05/2016 as well  as multiple additional prior imaging studies. FINDINGS: Imaging during ERCP demonstrates cannulation of the common bile duct and removal of an indwelling biliary stent. A guidewire was able to be passed through a biliary stricture. The stent was not able to be replaced. IMPRESSION: Imaging demonstrating removal of endoscopic common bile duct stent. The stent was not able to be replaced. These images were submitted for radiologic interpretation only. Please see the procedural report for the amount of contrast and the fluoroscopy time utilized. Electronically Signed   By: Aletta Edouard M.D.   On: 05/06/2016 14:47   Ir Biliary Drain Placement With Cholangiogram  Result Date: 05/07/2016 INDICATION: 71 year old female with malignant obstructed jaundice and cholangitis. She recently had a plastic biliary drain placed earlier this month but presents with an occluded drain and inability to pass through the distal common bile duct stricture on ERCP. She requires percutaneous transhepatic cholangiogram and drain placement. EXAM: PRIOR PERCUTANEOUS TRANSHEPATIC CHOLANGIOGRAM AND PLACEMENT OF AN INTERNAL/ EXTERNAL BILIARY DRAINAGE CATHETER MEDICATIONS: Patient is currently receiving 3.375 g Zosyn IV. No additional antibiotic given ANESTHESIA/SEDATION: Moderate (conscious) sedation was employed during this procedure. A total of Versed 2.5 mg and Fentanyl 100 mcg was administered intravenously. Moderate Sedation Time: 20 minutes. The patient's level of consciousness and vital signs were monitored continuously by radiology nursing throughout the procedure under my direct supervision. FLUOROSCOPY TIME:  Fluoroscopy Time: 3 minutes 18 seconds (62 mGy). COMPLICATIONS: None immediate. PROCEDURE: Informed written consent was obtained from the patient after a thorough discussion of the procedural risks, benefits and alternatives. All questions were addressed. Maximal Sterile Barrier Technique was utilized including caps, mask,  sterile gowns, sterile gloves, sterile drape, hand hygiene and skin antiseptic. A timeout was performed prior to the initiation of the procedure. The right upper quadrant was interrogated with ultrasound. Interval development of progressive biliary ductal dilatation. Dilated bile ducts can be identified sonographically. A suitable posterior division all duct in the right liver was identified. The overlying skin was anesthetized with 1% lidocaine. Under real-time sonographic guidance, the bile duct was punctured with a 21 gauge Accustick needle in a single pass. There is immediate return of turbid bile through the needle hub. A gentle hand injection of contrast material was performed in a limited cholangiogram was obtained. There  is extensive intra hepatic biliary ductal dilatation. The 0.018 wire was advanced into the biliary system centrally. The Accustick needle was then exchanged for the Accustick sheath which was advanced into the common hepatic duct. Additional contrast injection was performed. There is a high-grade lung segment stenosis of the common bile duct. A small wisp of contrast material passes through the stricture and into the duodenum. An Amplatz wire was successfully advanced through the stricture and into the duodenum. The skin tract was then dilated to 10 [place] and a Cook 10 French internal/external biliary drainage catheter was advanced over the wire and positioned with the locking loop in the duodenum. The tube was connected to bag drainage and secured to the skin with 0 Prolene suture. A sterile bandage was applied. The patient tolerated the procedure well. IMPRESSION: 1. 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 2. Successful placement of a 10 French internal/external biliary drainage catheter. Aspirated bile is frankly purulent. A  sample was sent for culture. PLAN: 1. Maintain biliary drain to bag drainage until symptoms of cholangitis have resolved and bilirubin has nearly normalized. 2. It would be preferable to cap the biliary drain prior to discharge if the above can be accomplished. 3. Follow electrolytes and replete as needed. 4. Return to interventional radiology in 2-4 weeks for biliary stent placement. Signed, Criselda Peaches, MD [facility] Electronically Signed   By: Jacqulynn Cadet M.D.   On: 05/07/2016 11:22    1/26 ERCP with sphincterotomy andstent placement across malignant CBD stricture.   3/8 EUS. Mass (adenocarcinoma) at uncinate.   3/15 admission with cholangitis, recurrent jaundice. Zosyn started  3/16 ERCP. Clogged stent removed, unable to pass wire and place new stent across severe malignant stricture.   3/17 Dr Laurence Ferrari placed 14fr int/ext biliary drain, aspirated purulent bile. Blood cx's negative. Bile culture grew pantoea species, enterococcus faecalis.   Subjective: Alert, still reports being sleepy but mentating at baseline. Wants to go home, but understands rationale behind PT recommendation for SNF. 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 No pain. Tolerating diet.   Discharge Exam: Vitals:   05/13/16 0413 05/13/16 0415  BP: (!) 163/49 (!) 150/53  Pulse: (!) 55 (!) 49  Resp: 18   Temp: 97.2 F (36.2 C)    General: Morbidly obese 71yo F in no distress, conversant and pleasant Cardiovascular: RRR, S1/S2 +, no rubs, no gallops Respiratory: CTA bilaterally, no wheezing, no rhonchi Gastrointestinal system: Abdomen is obese, not tender today, not distended. Right abdomen with perc drain draining dark green fluid. Skin: Right abdomen dressing c/d/i. No further significant jaundice. CNS: Alert and oriented completely. No focal deficits, but very diffusely weak.   The results of significant diagnostics  from this hospitalization (including imaging, microbiology, ancillary and laboratory) are listed below for reference.    Labs: BNP (last 3 results)  Recent Labs  01/04/16 1526 03/27/16 1323 03/28/16 2110  BNP 63.2 46.1 24.2   Basic Metabolic Panel:  Recent Labs Lab 05/09/16 0419 05/10/16 0533 05/11/16 0719 05/12/16 0423 05/13/16 0413  NA 137 140 142 140 139  K 3.6 3.4* 3.4* 3.3* 3.5  CL 96* 95* 101 101 101  CO2 32 34* 32 30 29  GLUCOSE 173* 145* 131* 150* 179*  BUN 20 11 7  5* <5*  CREATININE 0.98 0.84 0.70 0.61 0.61  CALCIUM 8.4* 8.9  8.6* 8.5* 8.7*   Liver Function Tests:  Recent Labs Lab 05/09/16 0419 05/10/16 0533 05/11/16 0719 05/12/16 0423 05/13/16 0413  AST 72* 46* 36 32 30  ALT 44 36 30 27 24   ALKPHOS 673* 666* 593* 595* 596*  BILITOT 6.3* 4.5* 3.2* 2.6* 2.4*  PROT 5.3* 5.8* 5.1* 5.5* 5.6*  ALBUMIN 1.5* 1.6* 1.6* 1.7* 1.7*   CBC:  Recent Labs Lab 05/07/16 0808 05/08/16 0535 05/09/16 0419 05/10/16 0533 05/11/16 0719  WBC 5.9 7.2 6.4 6.5 6.9  HGB 8.8* 8.5* 8.3* 8.4* 8.4*  HCT 28.2* 27.1* 26.8* 27.0* 27.0*  MCV 89.8 88.9 88.7 87.7 89.4  PLT 275 308 325 374 410*   CBG:  Recent Labs Lab 05/12/16 2005 05/13/16 0015 05/13/16 0408 05/13/16 0805 05/13/16 1207  GLUCAP 187* 195* 172* 127* 126*   Urinalysis    Component Value Date/Time   COLORURINE YELLOW 05/05/2016 0219   APPEARANCEUR CLEAR 05/05/2016 0219   LABSPEC 1.007 05/05/2016 0219   PHURINE 5.0 05/05/2016 0219   GLUCOSEU >=500 (A) 05/05/2016 0219   GLUCOSEU NEGATIVE 11/04/2015 0956   HGBUR NEGATIVE 05/05/2016 0219   BILIRUBINUR NEGATIVE 05/05/2016 0219   BILIRUBINUR neg 08/17/2015 1547   KETONESUR NEGATIVE 05/05/2016 0219   PROTEINUR NEGATIVE 05/05/2016 0219   UROBILINOGEN 0.2 11/04/2015 0956   NITRITE POSITIVE (A) 05/05/2016 0219   LEUKOCYTESUR TRACE (A) 05/05/2016 0219    Microbiology Recent Results (from the past 240 hour(s))  Urine culture     Status: Abnormal    Collection Time: 05/05/16  2:19 AM  Result Value Ref Range Status   Specimen Description URINE, CATHETERIZED  Final   Special Requests NONE  Final   Culture >=100,000 COLONIES/mL KLEBSIELLA PNEUMONIAE (A)  Final   Report Status 05/08/2016 FINAL  Final   Organism ID, Bacteria KLEBSIELLA PNEUMONIAE (A)  Final      Susceptibility   Klebsiella pneumoniae - MIC*    AMPICILLIN >=32 RESISTANT Resistant     CEFAZOLIN <=4 SENSITIVE Sensitive     CEFTRIAXONE <=1 SENSITIVE Sensitive     CIPROFLOXACIN <=0.25 SENSITIVE Sensitive     GENTAMICIN <=1 SENSITIVE Sensitive     IMIPENEM <=0.25 SENSITIVE Sensitive     NITROFURANTOIN 64 INTERMEDIATE Intermediate     TRIMETH/SULFA <=20 SENSITIVE Sensitive     AMPICILLIN/SULBACTAM >=32 RESISTANT Resistant     PIP/TAZO 16 SENSITIVE Sensitive     * >=100,000 COLONIES/mL KLEBSIELLA PNEUMONIAE  Culture, blood (Routine X 2) w Reflex to ID Panel     Status: None   Collection Time: 05/05/16  3:34 AM  Result Value Ref Range Status   Specimen Description BLOOD RIGHT ARM  Final   Special Requests AEROBIC BOTTLE ONLY 10ML ROCEPHIN  Final   Culture NO GROWTH 5 DAYS  Final   Report Status 05/10/2016 FINAL  Final  Culture, blood (Routine X 2) w Reflex to ID Panel     Status: None   Collection Time: 05/05/16  3:42 AM  Result Value Ref Range Status   Specimen Description BLOOD RIGHT HAND  Final   Special Requests IN PEDIATRIC BOTTLE 4ML ROCEPHIN  Final   Culture NO GROWTH 5 DAYS  Final   Report Status 05/10/2016 FINAL  Final  Body fluid culture     Status: Abnormal   Collection Time: 05/07/16 10:37 AM  Result Value Ref Range Status   Specimen Description FLUID  Final   Special Requests BILIARY DRAIN  Final   Gram Stain   Final    ABUNDANT WBC  PRESENT, PREDOMINANTLY PMN NO ORGANISMS SEEN    Culture (A)  Final    PANTOEA SPECIES ENTEROCOCCUS FAECALIS VIRIDANS STREPTOCOCCUS    Report Status 05/11/2016 FINAL  Final   Organism ID, Bacteria PANTOEA SPECIES  Final    Organism ID, Bacteria ENTEROCOCCUS FAECALIS  Final      Susceptibility   Enterococcus faecalis - MIC*    AMPICILLIN <=2 SENSITIVE Sensitive     VANCOMYCIN 1 SENSITIVE Sensitive     GENTAMICIN SYNERGY RESISTANT Resistant     * ENTEROCOCCUS FAECALIS   Pantoea species - MIC*    CEFAZOLIN <=4 SENSITIVE Sensitive     CEFEPIME <=1 SENSITIVE Sensitive     CEFTAZIDIME <=1 SENSITIVE Sensitive     CEFTRIAXONE <=1 SENSITIVE Sensitive     CIPROFLOXACIN <=0.25 SENSITIVE Sensitive     GENTAMICIN <=1 SENSITIVE Sensitive     IMIPENEM <=0.25 SENSITIVE Sensitive     TRIMETH/SULFA <=20 SENSITIVE Sensitive     PIP/TAZO <=4 SENSITIVE Sensitive     * PANTOEA SPECIES    Time coordinating discharge: Approximately 40 minutes  Vance Gather, MD  [facility] 05/13/2016, 1:08 PM Pager [phone]

## 2016-05-13 NOTE — Clinical Social Work Note (Signed)
Clinical Social Work Assessment  Patient Details  Name: Melissa Myers MRN: [id number] Date of Birth: 06-16-1945  Date of referral:  05/13/16               Reason for consult:  Facility Placement                Permission sought to share information with:  Chartered certified accountant granted to share information::  Yes, Verbal Permission Granted  Name::     Investment banker, corporate::  SNF  Relationship::  spouse  Contact Information:     Housing/Transportation Living arrangements for the past 2 months:  [place] of Information:  Patient, Spouse Patient Interpreter Needed:  None Criminal Activity/Legal Involvement Pertinent to Current Situation/Hospitalization:  No - Comment as needed Significant Relationships:  Spouse Lives with:  Parents, Spouse Do you feel safe going back to the place where you live?  No Need for family participation in patient care:  Yes (Comment) (help with care/decision making)  Care giving concerns:  Pt lives at home with spouse and elderly mother (56yo)- spouse already responsible for caring for pt mom and cannot manage pt at home as well.   Social Worker assessment / plan:  CSW spoke with pt and pt spouse regarding SNF placement for rehab.  Explained SNF and SNF referral process.  Pt had tried to go to rehab recently but left AMA almost immediately.  CSW also discussed concerns that insurance would not cover SNF due to pt acute illness/poor prognosis- they do not think they can safely take pt home at this time so unsure what they would do.  Employment status:  Retired Nurse, adult PT Recommendations:  [place] / Referral to community resources:  [place]  Patient/Family's Response to care:  Patient agreeable to SNF- understands it would not be safe to return home.  Patient/Family's Understanding of and Emotional Response to Diagnosis, Current Treatment,  and Prognosis:  Pt and spouse overwhelmed by pt condition and poor prognosis- understand need for SNF and hopeful pt will improve so she can return home and be with family.  Emotional Assessment Appearance:  Appears stated age Attitude/Demeanor/Rapport:    Affect (typically observed):  Appropriate Orientation:  Oriented to Self, Oriented to Place, Oriented to Situation Alcohol / Substance use:  Not Applicable Psych involvement (Current and /or in the community):     Discharge Needs  Concerns to be addressed:  Care Coordination Readmission within the last 30 days:  Yes Current discharge risk:  Physical Impairment Barriers to Discharge:  Continued Medical Work up   Jorge Ny, LCSW 05/13/2016, 9:59 AM

## 2016-05-16 ENCOUNTER — Telehealth: Payer: Self-pay | Admitting: Hematology

## 2016-05-16 ENCOUNTER — Encounter: Payer: Self-pay | Admitting: Genetics

## 2016-05-16 DIAGNOSIS — K830 Cholangitis: Secondary | ICD-10-CM | POA: Diagnosis not present

## 2016-05-16 DIAGNOSIS — C250 Malignant neoplasm of head of pancreas: Secondary | ICD-10-CM | POA: Diagnosis not present

## 2016-05-16 DIAGNOSIS — E1140 Type 2 diabetes mellitus with diabetic neuropathy, unspecified: Secondary | ICD-10-CM | POA: Diagnosis not present

## 2016-05-16 DIAGNOSIS — I5032 Chronic diastolic (congestive) heart failure: Secondary | ICD-10-CM | POA: Diagnosis not present

## 2016-05-16 NOTE — Telephone Encounter (Signed)
Received a call from [facility] facility to reschedule the pt's appt from 3/20. Pt was in the hospital at the time and was unable to make it to her appt. Appt has been rescheduled for the pt to see Dr. Burr Medico on 3/29 at 230pm. Aware to arrive 30 minutes early.

## 2016-05-17 ENCOUNTER — Other Ambulatory Visit: Payer: Self-pay | Admitting: Radiology

## 2016-05-17 NOTE — Progress Notes (Signed)
[place]  [phone]) [phone] [phone]) [phone]  Clinic Follow Up Note   Patient Care Team: [name], MD as PCP - General (Internal Medicine) Rigoberto Noel, MD as Consulting Physician (Pulmonary Disease) Larey Dresser, MD as Consulting Physician (Cardiology) Megan Salon, MD as Consulting Physician (Gynecology) Kristeen Miss, MD as Consulting Physician (Neurosurgery) Earlie Server, MD (Orthopedic Surgery) Almedia Balls, MD (Orthopedic Surgery) Lafayette Dragon, MD (Inactive) (Gastroenterology) Renato Shin, MD (Endocrinology) Darleen Crocker, MD (Ophthalmology) 05/19/2016  CHIEF COMPLAINTS:  Follow up pancreatic Cancer   HISTORY OF PRESENTING ILLNESS:  Melissa Myers 71 y.o. female is here becauseThe patient presented to the ED on 03/15/16 due to weakness, hyperglycemia, jaundice, abdominal pain, mild confusion, and chronic diarrhea. Physical exam at the time revealed abdominal distension and diffuse tenderness. Labs found the patient hyperbilirubinemic. CT scan of the abdomen showed pancreatic atrophy with moderate pancreatic duct dilatation, followed to the level of the duodenum. 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 Considerations include otherwise occult stricture, periampullary lesion, or main duct intraductal papillary mucinous neoplasm. MRI of the abdomen the same day showed a 2.2 x 2.8 cm hypoenhancing lesion in the uncinate process of the pancreas, better demonstrated by MRI than on the recent CT scan. The dilated main pancreatic duct and dilated common bile duct abruptly terminate at the cranial margin of this lesion. MRI features are concerning for pancreatic adenocarcinoma.CA 19-9 on 03/17/16 was 107; elevated.  The patient underwent ERCP, with biliary sphincterotomy and biliary stent placement by Dr. Henrene Pastor 03/18/16. The patient was subsequently discharged on 03/21/16.  On 03/27/16, the patient presented to the ED on 03/29/16 for flu like symptoms.  She was Influenza A positive. She was given fluids and discharged with Tamiflu. She presented back to the ED the following day for worsening symptoms. She was started on antibiotics for possible developing pneumonia giving a productive cough.  Given enough time to recover from her hospitalization and the flu, the patient underwent biopsy of the pancreatic mass by Dr. Ardis Hughs on 04/28/16 revealing adenocarcinoma.  Since then, the patient presented to the ED on 05/04/16 for a headaches, hyperglycemia, confusion, abdominal pain, and nausea. Given presentation of fever/confusion, she was admitted to the hospital.  CT of the head on 05/05/16 was negative. CT of the abd/pelvis on 05/05/16 showed interval placement of a bile duct stent with some decompression of the bile ducts, mild residual biliary dilatation, and pancreatic ductal dilatation and atrophy.  She was diagnosed with cholangitis and recurrent jaundice. Zosyn was started. ERCP on 05/06/16 showed a clogged stent which was removed, though a new stent was not able to be placed across a malignant biliary stricture. On 05/07/16, percutaneous transhepatic cholangiogram and placement of a 63F internal/external biliary drain was performed by IR with aspirated purulent bile sent for culture. Blood cultures remained negative, though bile culture grew pantoea species, enterococcus faecalis, viridans streptococcus.Urine culture had also grown Klebsiella pneumoniae, sensitive to Zosyn.  I see the patient today in an inpatient setting to discuss treatment options for the management of her pancreatic adenocarcinoma. I met patient, her husband and son in her room. According to patient's family, she used to be independent, does self care and housework, walks with a walker. Due to fatigue, anorexia, she has not been able to perform her daily activities in the past 4-6 weeks, and has spent most time in bed and chair for the past few weeks.    CURRENT THERAPY: Pending,  likely radiation   INTERIM HISTORY:  Melissa Myers returns for follow-up. She was accompanied by her son and daughter. She was discharged from hospital to a rehabilitation a week ago, has been doing better overall, she participates PT, and her appetite and energy level has improved quite a bit. She came into the interventional radiology this morning for biliary stent placement, and her percutaneous G-tube was replaced. She came to my clinic after the procedure. She is quite drowsy, and complains of significant abdominal pain, especially at the site of procedure today. She has not eaten or drunk much today. She complains of being cold, rapid with blankets. She was noticed to have fever up to 103 today in my clinic.   MEDICAL HISTORY:  Past Medical History:  Diagnosis Date  . Anemia   . Cellulitis    LOWER EXTREMITIES  . Chronic diarrhea    a/w nausea - felt related to IBS  . Deaf    left side only  . Diastolic CHF ([place])   . Disc degeneration, lumbar   . GERD (gastroesophageal reflux disease)   . Hyperlipidemia    hx rhabdo on statins  . Hypertension   . Neuropathy ([facility])    feet, toes and fingers  . On home oxygen therapy    uses oxygen 2 liters min per [place] at night and prn during day  . OSA (obstructive sleep apnea)    05/2009 sleep study - refuses CPAP  . Osteoarthritis   . RLS (restless legs syndrome)   . Shortness of breath    chronic  . Stasis dermatitis   . Type II or unspecified type diabetes mellitus without mention of complication, not stated as uncontrolled    insulin dep    SURGICAL HISTORY: Past Surgical History:  Procedure Laterality Date  . BILIARY STENT PLACEMENT N/A 05/06/2016   Procedure: BILIARY STENT PLACEMENT;  Surgeon: Gatha Mayer, MD;  Location: [facility];  Service: Endoscopy;  Laterality: N/A;  . CHOLECYSTECTOMY  1997  . COLONOSCOPY N/A 12/03/2012   Procedure: COLONOSCOPY;  Surgeon: Lafayette Dragon, MD;  Location: [facility];  Service: Endoscopy;   Laterality: N/A;  . ENDOSCOPIC RETROGRADE CHOLANGIOPANCREATOGRAPHY (ERCP) WITH PROPOFOL N/A 05/06/2016   Procedure: ENDOSCOPIC RETROGRADE CHOLANGIOPANCREATOGRAPHY (ERCP) WITH PROPOFOL;  Surgeon: Gatha Mayer, MD;  Location: [facility];  Service: Endoscopy;  Laterality: N/A;  . ERCP N/A 03/18/2016   Procedure: ENDOSCOPIC RETROGRADE CHOLANGIOPANCREATOGRAPHY (ERCP);  Surgeon: Irene Shipper, MD;  Location: [facility];  Service: Endoscopy;  Laterality: N/A;  . EUS N/A 04/28/2016   Procedure: UPPER ENDOSCOPIC ULTRASOUND (EUS) LINEAR;  Surgeon: [name], MD;  Location: [facility];  Service: Endoscopy;  Laterality: N/A;  . IR GENERIC HISTORICAL  05/07/2016   IR BILIARY DRAIN PLACEMENT WITH CHOLANGIOGRAM 05/07/2016 Jacqulynn Cadet, MD [facility]  . IR GENERIC HISTORICAL  05/19/2016   IR BILIARY STENT(S) EXISTING ACCESS INC DILATION CATH EXCHANGE 05/19/2016 Jacqulynn Cadet, MD [facility]  . TONSILLECTOMY  1970  . TUBAL LIGATION  1980  . UMBILICAL HERNIA REPAIR  1995  . uterine polyp removal  2008    SOCIAL HISTORY: Social History   Social History  . Marital status: Married    Spouse name: N/A  . Number of children: N/A  . Years of education: N/A   Occupational History  . Not on file.   Social History Main Topics  . Smoking status: Never Smoker  . Smokeless tobacco: Never Used  . Alcohol use No  . Drug use: No  . Sexual activity: No  Other Topics Concern  . Not on file   Social History Narrative   Lives with spouse and mother. Retired Network engineer, now housewife    FAMILY HISTORY: Family History  Problem Relation Age of Onset  . Heart disease Mother   . Heart attack Mother 40  . Heart disease Father   . Heart attack Father 50  . Heart disease      family history  . Stomach cancer Paternal Grandmother   . Lung cancer Paternal Grandfather   . CVA      several aunts  . Heart attack      several aunts and an uncle  . Colon cancer Neg Hx     ALLERGIES:  is  allergic to cymbalta [duloxetine hcl]; statins; sulfa antibiotics; levemir [insulin detemir]; morphine; and zinc.  MEDICATIONS:  No current facility-administered medications for this visit.    No current outpatient prescriptions on file.   Facility-Administered Medications Ordered in Other Visits  Medication Dose Route Frequency Provider Last Rate Last Dose  . 0.9 %  sodium chloride infusion   Intravenous Continuous Darrell K Allred, PA-C   Stopped at 05/19/16 1145  . 0.9 %  sodium chloride infusion   Intravenous Continuous Darrell K Allred, PA-C      . fentaNYL (SUBLIMAZE) 100 MCG/2ML injection           . lidocaine (XYLOCAINE) 1 % (with pres) injection           . midazolam (VERSED) 2 MG/2ML injection           . piperacillin-tazobactam (ZOSYN) IVPB 3.375 g  3.375 g Intravenous Once Darrell K Allred, PA-C        REVIEW OF SYSTEMS:   Constitutional: (+) fevers and chills today,  (+) Abd pain after today's procedure, (+) fatigue Eyes: Denies blurriness of vision, double vision or watery eyes Ears, nose, mouth, throat, and face: Denies mucositis or sore throat Respiratory: Denies cough, dyspnea or wheezes Cardiovascular: Denies palpitation, chest discomfort or lower extremity swelling Gastrointestinal:  Denies nausea, heartburn or change in bowel habits, (+) abdominal pain  Skin: Denies abnormal skin rashes Lymphatics: Denies new lymphadenopathy or easy bruising Neurological:Denies numbness, tingling or new weaknesses Behavioral/Psych: Mood is stable, no new changes  All other systems were reviewed with the patient and are negative.  PHYSICAL EXAMINATION:  ECOG PERFORMANCE STATUS: 3 - Symptomatic, >50% confined to bed  Vitals:   05/19/16 1520 05/19/16 1545  BP: (!) 150/61   Pulse: 78 72  Temp: (!) 101.4 F (38.6 C)    Filed Weights    GENERAL: Drowsy, sitting in wheelchair, wrapped with blankets SKIN: skin color, texture, turgor are normal, no rashes or significant  lesions EYES: normal, conjunctiva are pink and non-injected, sclera clear OROPHARYNX:no exudate, no erythema and lips, buccal mucosa, and tongue normal  NECK: supple, thyroid normal size, non-tender, without nodularity LYMPH:  no palpable lymphadenopathy in the cervical, axillary or inguinal LUNGS: clear to auscultation and percussion with normal breathing effort HEART: regular rate & rhythm and no murmurs and no lower extremity edema ABDOMEN: Significant tenderness in the right upper quadrant, percutaneous biliary drain tube is covered by gauze, and capped. No significant discharge or bleeding Musculoskeletal:no cyanosis of digits and no clubbing  PSYCH: alert & oriented x 3 with fluent speech NEURO: no focal motor/sensory deficits  LABORATORY DATA:  I have reviewed the data as listed CBC Latest Ref Rng & Units 05/19/2016 05/11/2016 05/10/2016  WBC 4.0 - 10.5 K/uL 8.2 6.9  6.5  Hemoglobin 12.0 - 15.0 g/dL 10.9(L) 8.4(L) 8.4(L)  Hematocrit 36.0 - 46.0 % 33.9(L) 27.0(L) 27.0(L)  Platelets 150 - 400 K/uL 405(H) 410(H) 374   CMP Latest Ref Rng & Units 05/19/2016 05/13/2016 05/12/2016  Glucose 65 - 99 mg/dL 281(H) 179(H) 150(H)  BUN 6 - 20 mg/dL 22(H) <5(L) 5(L)  Creatinine 0.44 - 1.00 mg/dL 1.31(H) 0.61 0.61  Sodium 135 - 145 mmol/L 136 139 140  Potassium 3.5 - 5.1 mmol/L 3.3(L) 3.5 3.3(L)  Chloride 101 - 111 mmol/L 91(L) 101 101  CO2 22 - 32 mmol/L 30 29 30   Calcium 8.9 - 10.3 mg/dL 8.7(L) 8.7(L) 8.5(L)  Total Protein 6.5 - 8.1 g/dL 7.5 5.6(L) 5.5(L)  Total Bilirubin 0.3 - 1.2 mg/dL 2.2(H) 2.4(H) 2.6(H)  Alkaline Phos 38 - 126 U/L 458(H) 596(H) 595(H)  AST 15 - 41 U/L 31 30 32  ALT 14 - 54 U/L 19 24 27      RADIOGRAPHIC STUDIES:  CT Chest WO Contrast 05/07/16 Carlean Purl IMPRESSION: 1. New small right and trace left pleural effusions. Mild cardiomegaly. 2. Borderline enlarged right hilar lymph node at 1 cm in short axis. 3. No definite findings of metastatic disease to the chest. 4.  Centrilobular emphysema. 5. High density in the visualized part of the dorsal pancreatic duct, probably contrast medium. 6. Subacute healing fracture of the right seventh rib laterally. 7. Atherosclerosis, including the left anterior descending coronary artery.  DG ERCP 05/06/16 Carlean Purl IMPRESSION: Imaging demonstrating removal of endoscopic common bile duct stent. The stent was not able to be replaced.   CT Abd, Head, Pelvis 05/05/16 IMPRESSION: Interval placement of a bile duct stent with some decompression of the bile ducts. Mild residual biliary dilatation. Pancreatic ductal dilatation and atrophy. Known pancreatic mass lesion is not well depicted at CT. No evidence of bowel obstruction or inflammation.  I have personally reviewed the radiological images as listed and agreed with the findings in the report. Ct Head Wo Contrast  Result Date: 05/05/2016 CLINICAL DATA:  71 year old female with headache and confusion. EXAM: CT HEAD WITHOUT CONTRAST TECHNIQUE: Contiguous axial images were obtained from the base of the skull through the vertex without intravenous contrast. COMPARISON:  Head CT dated 10/22/2014 FINDINGS: Brain: The ventricles and sulci appropriate in size for patient's age. Minimal periventricular and deep white matter chronic microvascular ischemic changes noted. There is no acute intracranial hemorrhage. No mass effect or midline shift noted. There is no extra-axial fluid collection. Vascular: No hyperdense vessel or unexpected calcification. Skull: Normal. Negative for fracture or focal lesion. Sinuses/Orbits: No acute finding. Other: None. IMPRESSION: No acute intracranial pathology. Electronically Signed   By: Anner Crete M.D.   On: 05/05/2016 01:21   Ct Chest Wo Contrast  Result Date: 05/07/2016 CLINICAL DATA:  Pancreatic mass, recent bile ducts stent placement EXAM: CT CHEST WITHOUT CONTRAST TECHNIQUE: Multidetector CT imaging of the chest was performed following  the standard protocol without IV contrast. COMPARISON:  CT abdomen from 05/05/2016 FINDINGS: Cardiovascular: Aortic arch and left anterior descending coronary artery atherosclerotic calcification. Calcified mitral valve. Mild cardiomegaly. Mediastinum/Nodes: Small paratracheal lymph nodes are present better not appreciably pathologically enlarged. A right hilar node measures 1 cm in short axis on image 56/ 3, borderline prominent. Lungs/Pleura: Scattered bandlike subsegmental atelectasis in both lungs. Centrilobular emphysema. Increased medial atelectasis in the left lower lobe compared to the prior CT abdomen. Small right and trace left pleural effusions with some fluid tracking in the major fissures. Pleural effusions appear new  compared to 05/05/16. Upper Abdomen: High density material in the pancreatic duct, probably contrast, only partially included on today' s exam. Musculoskeletal: Subacute healing fracture the right seventh rib laterally. Old healed right third rib fracture. Thoracic spondylosis and mild thoracic kyphosis. IMPRESSION: 1. New small right and trace left pleural effusions. Mild cardiomegaly. 2. Borderline enlarged right hilar lymph node at 1 cm in short axis. 3. No definite findings of metastatic disease to the chest. 4. Centrilobular emphysema. 5. High density in the visualized part of the dorsal pancreatic duct, probably contrast medium. 6. Subacute healing fracture of the right seventh rib laterally. 7. Atherosclerosis, including the left anterior descending coronary artery. Electronically Signed   By: Van Clines M.D.   On: 05/07/2016 12:03   Ct Abdomen Pelvis W Contrast  Result Date: 05/05/2016 CLINICAL DATA:  Epigastric pain. Hx diabetes, GERD, lumbar disc degeneration, chronic diarrhea, uterine polyp removal, umbilical hernia, tubal ligation, and cholecystectomy. EXAM: CT ABDOMEN AND PELVIS WITH CONTRAST TECHNIQUE: Multidetector CT imaging of the abdomen and pelvis was  performed using the standard protocol following bolus administration of intravenous contrast. CONTRAST:  189m ISOVUE-300 IOPAMIDOL (ISOVUE-300) INJECTION 61% COMPARISON:  ERCP 03/18/2016.  MRI 03/16/2016.  CT 03/15/2016. FINDINGS: Lower chest: Scattered emphysematous changes in the lung bases. Fibrosis or atelectasis in the lung bases. Calcification in the mitral valve annulus and coronary arteries. Hepatobiliary: Since the previous study, there is interval placement of a bile duct stent. Mild residual bile duct dilatation is less prominent than on prior study. Surgical absence of the gallbladder. No focal liver lesions. Pancreas: Diffuse pancreatic ductal dilatation with pancreatic parenchymal atrophy. Mass lesion demonstrated in the head of the pancreas on previous MRI is not well visualized at CT. No inflammatory stranding. Spleen: Focal lesion in the spleen measuring about 6 mm diameter is unchanged since previous study. This probably represents a small cyst. Additional subcentimeter lesion in the upper spleen is also unchanged. Adrenals/Urinary Tract: Adrenal glands are unremarkable. Kidneys are normal, without renal calculi, focal lesion, or hydronephrosis. Bladder is unremarkable. Stomach/Bowel: Stomach and small bowel are mostly decompressed. No small bowel distention or inflammatory infiltration. Colon is diffusely stool-filled without abnormal distention or inflammation. Scattered colonic diverticula. Appendix is not identified. Vascular/Lymphatic: Aortic atherosclerosis. No enlarged abdominal or pelvic lymph nodes. Reproductive: Uterus and bilateral adnexa are unremarkable. Other: Small left inguinal hernia containing fat. No free air or free fluid in the abdomen. Musculoskeletal: Degenerative changes in the spine. No destructive bone lesions. IMPRESSION: Interval placement of a bile duct stent with some decompression of the bile ducts. Mild residual biliary dilatation. Pancreatic ductal dilatation and  atrophy. Known pancreatic mass lesion is not well depicted at CT. No evidence of bowel obstruction or inflammation. Electronically Signed   By: WLucienne CapersM.D.   On: 05/05/2016 04:37   Dg Chest Port 1 View  Result Date: 05/05/2016 CLINICAL DATA:  Confusion, hyperglycemia, and headache tonight EXAM: PORTABLE CHEST 1 VIEW COMPARISON:  03/28/2016 FINDINGS: Shallow inspiration. The heart size and mediastinal contours are within normal limits. Both lungs are clear. The visualized skeletal structures are unremarkable. IMPRESSION: No active disease. Electronically Signed   By: WLucienne CapersM.D.   On: 05/05/2016 00:04   Dg Ercp  Result Date: 05/06/2016 CLINICAL DATA:  Adenocarcinoma of the pancreas, cholangitis, jaundice and previous endoscopic biliary stent placement. EXAM: ERCP TECHNIQUE: Multiple spot images obtained with the fluoroscopic device and submitted for interpretation post-procedure. COMPARISON:  CT of the abdomen on 05/05/2016 as well as multiple additional  prior imaging studies. FINDINGS: Imaging during ERCP demonstrates cannulation of the common bile duct and removal of an indwelling biliary stent. A guidewire was able to be passed through a biliary stricture. The stent was not able to be replaced. IMPRESSION: Imaging demonstrating removal of endoscopic common bile duct stent. The stent was not able to be replaced. These images were submitted for radiologic interpretation only. Please see the procedural report for the amount of contrast and the fluoroscopy time utilized. Electronically Signed   By: Aletta Edouard M.D.   On: 05/06/2016 14:47   Ir Biliary Drain Placement With Cholangiogram  Result Date: 05/07/2016 INDICATION: 71 year old female with malignant obstructed jaundice and cholangitis. She recently had a plastic biliary drain placed earlier this month but presents with an occluded drain and inability to pass through the distal common bile duct stricture on ERCP. She requires  percutaneous transhepatic cholangiogram and drain placement. EXAM: PRIOR PERCUTANEOUS TRANSHEPATIC CHOLANGIOGRAM AND PLACEMENT OF AN INTERNAL/ EXTERNAL BILIARY DRAINAGE CATHETER MEDICATIONS: Patient is currently receiving 3.375 g Zosyn IV. No additional antibiotic given ANESTHESIA/SEDATION: Moderate (conscious) sedation was employed during this procedure. A total of Versed 2.5 mg and Fentanyl 100 mcg was administered intravenously. Moderate Sedation Time: 20 minutes. The patient's level of consciousness and vital signs were monitored continuously by radiology nursing throughout the procedure under my direct supervision. FLUOROSCOPY TIME:  Fluoroscopy Time: 3 minutes 18 seconds (62 mGy). COMPLICATIONS: None immediate. PROCEDURE: Informed written consent was obtained from the patient after a thorough discussion of the procedural risks, benefits and alternatives. All questions were addressed. Maximal Sterile Barrier Technique was utilized including caps, mask, sterile gowns, sterile gloves, sterile drape, hand hygiene and skin antiseptic. A timeout was performed prior to the initiation of the procedure. The right upper quadrant was interrogated with ultrasound. Interval development of progressive biliary ductal dilatation. Dilated bile ducts can be identified sonographically. A suitable posterior division all duct in the right liver was identified. The overlying skin was anesthetized with 1% lidocaine. Under real-time sonographic guidance, the bile duct was punctured with a 21 gauge Accustick needle in a single pass. There is immediate return of turbid bile through the needle hub. A gentle hand injection of contrast material was performed in a limited cholangiogram was obtained. There is extensive intra hepatic biliary ductal dilatation. The 0.018 wire was advanced into the biliary system centrally. The Accustick needle was then exchanged for the Accustick sheath which was advanced into the common hepatic duct.  Additional contrast injection was performed. There is a high-grade lung segment stenosis of the common bile duct. A small wisp of contrast material passes through the stricture and into the duodenum. An Amplatz wire was successfully advanced through the stricture and into the duodenum. The skin tract was then dilated to 10 [place] and a Cook 10 French internal/external biliary drainage catheter was advanced over the wire and positioned with the locking loop in the duodenum. The tube was connected to bag drainage and secured to the skin with 0 Prolene suture. A sterile bandage was applied. The patient tolerated the procedure well. IMPRESSION: 1. 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 2. Successful placement of a 10 French internal/external biliary drainage catheter. Aspirated bile is frankly purulent. A sample was sent for culture. PLAN: 1. Maintain biliary drain to bag drainage until symptoms of cholangitis have resolved and bilirubin has nearly normalized. 2. It would be preferable  to cap the biliary drain prior to discharge if the above can be accomplished. 3. Follow electrolytes and replete as needed. 4. Return to interventional radiology in 2-4 weeks for biliary stent placement. Signed, Criselda Peaches, MD [facility] Electronically Signed   By: Jacqulynn Cadet M.D.   On: 05/07/2016 11:22   Ir Biliary Stent(s) Existing Access Inc Dilation Cath Exchange  Result Date: 05/19/2016 INDICATION: 71 year old female with pancreatic cancer and malignant obstructed jaundice. A percutaneous transhepatic biliary drain was placed on 05/07/2016 via a right posterior duct approach. She is now doing better and presents for placement of an internal stent. EXAM: 1. Cholangiogram through existing access  2. Placement of a 10 x 60 mm wall flex self expanding covered metallic stent 3. Biliary drain exchange MEDICATIONS: 3.375 g Zosyn; The antibiotic was administered within an appropriate time frame prior to the initiation of the procedure. ANESTHESIA/SEDATION: Moderate (conscious) sedation was employed during this procedure. A total of Versed 3.5 mg and Fentanyl 100 mcg was administered intravenously. Moderate Sedation Time: 14 minutes. The patient's level of consciousness and vital signs were monitored continuously by radiology nursing throughout the procedure under my direct supervision. FLUOROSCOPY TIME:  Fluoroscopy Time: 0 minutes 5 seconds (48 mGy). COMPLICATIONS: SIR LEVEL B - Normal therapy, includes overnight admission for observation. Approximately 2 hours after discharge the patient became febrile with rigors while in her oncologist's office. She was admitted overnight for observation, IV fluids and additional antibiotics. PROCEDURE: Informed written consent was obtained from the patient after a thorough discussion of the procedural risks, benefits and alternatives. All questions were addressed. Maximal Sterile Barrier Technique was utilized including caps, mask, sterile gowns, sterile gloves, sterile drape, hand hygiene and skin antiseptic. A timeout was performed prior to the initiation of the procedure. The existing 10 [place] biliary tube was cut and removed over an Amplatz wire. An 8 [place] vascular sheath was advanced over the wire and into the duodenum. A pull-back cholangiogram was performed. Persisting complete long segment occlusion of the common bile duct. There is some persistent intra and extrahepatic biliary ductal dilatation. No obvious debris or stones. While keeping the Amplatz wire within the duodenum as a safety wire, an angled catheter was advanced through the 8 French sheath coaxially and positioned in the duodenum. The tip of the B[place]on wire was then advanced through the [place] the  catheter in used to measure the length of the common bile duct stenosis which measures approximately 5 cm. Therefore, we will proceed with placement of a 10 x 60 mm wall flex self expanding covered metal biliary stent. The stent was selected in advanced over the Amplatz wire. The stent was then successfully deployed. There was excellent opening of the proximal and distal landing zone and partial opening in the region of the stricture. There was immediate evacuation of contrast an bile through the stent. Given the patient's past history of colon check his, the decision was made to leave a internal biliary drain into a we have confirmed that the stent drainage is sufficient. The Amplatz wire was withdrawn through the stent and coiled in the hepatic biliary confluence. The 8 French sheath was removed. A new 10.2 [place] cook all-purpose drainage catheter was advanced over the wire and coiled in the region of the biliary confluence. The catheter was gently flushed and capped. The patient tolerated the procedure well. IMPRESSION: 1. 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 2. Successful placement of a 10  x 60 wall flex self expanding covered metallic stent. 3. A 10 French internal biliary drain was left in place at the hepatic confluence proximal to the stent. This tube will be left capped for a physiologic trial to insure that the metal stent is sufficient. PLAN: 1. Patient developed fever and rigors several hours following the procedure requiring admission for observation. This is likely secondary to transient bacteremia related to colonization of the bile and today's manipulation. 2. Biliary drain should be left to bag drainage overnight. If the patient is feeling well and ready for discharge tomorrow, the drain can be capped prior to discharge. 3. Patient to return Interventional Radiology in 4 weeks for cholangiogram and possible drain removal. A complete metabolic panel will be drawn at  that time to assess bilirubin. 4. The family understands that if she develops symptoms of recurrent biliary obstruction or jaundice during the four-week physiologic trial they should immediately connect the drainage catheter back to gravity bag drainage. A was noted the patient is currently in a rehab facility. Therefore, these instructions were written down on a progress note for the family to give to her caregivers. Signed, Criselda Peaches, MD [facility] Electronically Signed   By: Jacqulynn Cadet M.D.   On: 05/19/2016 16:45     ASSESSMENT & PLAN: 71 y.o.female with PMH of IDDM with neuropathy, HFpEF, HTN, OSA on intermittent home oxygen, and recently diagnosed pancreatic mass s/p biliary stent 04/28/2016 who was recently treated for acute cholangitis, status post cutaneous biliary drainage. She had biliary drainage tube exchange with peri-stent placement by IR Dr. Laurence Ferrari today  1. Fever, chills and abdominal pain -This developed after her percutaneous biliary draining tube exchange and stent placement this morning, possible infection. She did receive 1 dose of Zosyn during the procedure this morning. -I'll repeat blood culture today -IV fluids and pain medication -I discussed with Dr. Laurence Ferrari and we decided to admit pt to [facility] for observation today  2. 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 She was evaluated by general surgeon Dr. Barry Dienes. However she may not be a good candidate due to her advanced age, comorbidities, malnutrition and a very poor performance status. -We reviewed the natural history of pancreatic cancer, and a very high risk of recurrence after surgical resection. Most people requires neoadjuvant or adjuvant chemotherapy and possible radiation -We have discussed her case in our she  had tumor board. Dr. Langston Reusing and I recommend that radiation is the best option for the patient for now, for disease control  - I will refer to Dr. Lisbeth Renshaw,  - after radiation is completed I will see her again to see how we will monitor going forward and discuss if surgery is an option.  3. IDDM, morbid obesity -follow up with PCP  4. Chronic diastolic heart failure -follow up with PCP  5. Malnutrition and deconditioning -I encouraged her to continue physical therapy at his rehabilitation  6. AKI -Cr 1.3 today -hydration   Recommendations: -I obtained blood cultures X2 in my clinic -Normal saline 1 L and Percocet for pain control in my clinic  -We'll admit the patient to [facility], Dr. Posey Pronto has kindly agreed to admit her to Hospitalist service -I will refer her to rad/onc Dr. Lisbeth Renshaw    Orders Placed This Encounter  Procedures  . Culture, Blood    Standing Status:  Future    Number of Occurrences:   1    Standing Expiration Date:   05/19/2017  . Culture, Blood    Standing Status:   Future    Number of Occurrences:   1    Standing Expiration Date:   05/19/2017  . CBC with Differential    Standing Status:   Standing    Number of Occurrences:   30    Standing Expiration Date:   05/19/2021  . Comprehensive metabolic panel    Standing Status:   Standing    Number of Occurrences:   30    Standing Expiration Date:   05/19/2021  . CA 19.9    Standing Status:   Standing    Number of Occurrences:   30    Standing Expiration Date:   05/19/2021    All questions were answered. The patient knows to call the clinic with any problems, questions or concerns. I spent 30 minutes counseling the patient face to face. The total time spent in the appointment was 40 minutes and more than 50% was on counseling.   This document serves as a record of services personally performed by Truitt Merle, MD. It was created on her behalf by Brandt Loosen, a trained medical scribe. The creation of this  record is based on the scribe's personal observations and the provider's statements to them. This document has been checked and approved by the attending provider.   Truitt Merle, MD 05/19/2016 6:19 PM

## 2016-05-18 ENCOUNTER — Other Ambulatory Visit: Payer: Self-pay | Admitting: Physician Assistant

## 2016-05-18 ENCOUNTER — Other Ambulatory Visit: Payer: Self-pay | Admitting: General Surgery

## 2016-05-18 ENCOUNTER — Encounter: Payer: Self-pay | Admitting: Radiation Oncology

## 2016-05-18 DIAGNOSIS — K838 Other specified diseases of biliary tract: Secondary | ICD-10-CM | POA: Diagnosis not present

## 2016-05-18 DIAGNOSIS — I509 Heart failure, unspecified: Secondary | ICD-10-CM | POA: Diagnosis not present

## 2016-05-18 DIAGNOSIS — E119 Type 2 diabetes mellitus without complications: Secondary | ICD-10-CM | POA: Diagnosis not present

## 2016-05-18 DIAGNOSIS — I10 Essential (primary) hypertension: Secondary | ICD-10-CM | POA: Diagnosis not present

## 2016-05-18 DIAGNOSIS — E46 Unspecified protein-calorie malnutrition: Secondary | ICD-10-CM | POA: Diagnosis not present

## 2016-05-18 DIAGNOSIS — C259 Malignant neoplasm of pancreas, unspecified: Secondary | ICD-10-CM | POA: Diagnosis not present

## 2016-05-18 DIAGNOSIS — N179 Acute kidney failure, unspecified: Secondary | ICD-10-CM | POA: Diagnosis not present

## 2016-05-18 DIAGNOSIS — N390 Urinary tract infection, site not specified: Secondary | ICD-10-CM | POA: Diagnosis not present

## 2016-05-18 DIAGNOSIS — K830 Cholangitis: Secondary | ICD-10-CM | POA: Diagnosis not present

## 2016-05-19 ENCOUNTER — Encounter: Payer: Self-pay | Admitting: Hematology

## 2016-05-19 ENCOUNTER — Encounter (HOSPITAL_COMMUNITY): Payer: Self-pay

## 2016-05-19 ENCOUNTER — Ambulatory Visit: Payer: PPO | Admitting: Nurse Practitioner

## 2016-05-19 ENCOUNTER — Encounter (HOSPITAL_COMMUNITY): Payer: Self-pay | Admitting: *Deleted

## 2016-05-19 ENCOUNTER — Inpatient Hospital Stay (HOSPITAL_COMMUNITY)
Admission: AD | Admit: 2016-05-19 | Discharge: 2016-05-24 | DRG: 862 | Disposition: A | Discharge status: Skilled Nursing Facility | Payer: PPO | Source: Ambulatory Visit | Attending: Internal Medicine | Admitting: Internal Medicine

## 2016-05-19 ENCOUNTER — Ambulatory Visit (HOSPITAL_BASED_OUTPATIENT_CLINIC_OR_DEPARTMENT_OTHER): Payer: PPO | Admitting: Hematology

## 2016-05-19 ENCOUNTER — Other Ambulatory Visit: Payer: Self-pay | Admitting: Interventional Radiology

## 2016-05-19 ENCOUNTER — Ambulatory Visit (HOSPITAL_COMMUNITY)
Admission: RE | Admit: 2016-05-19 | Discharge: 2016-05-19 | Disposition: A | Discharge status: Home / Self Care | Payer: PPO | Source: Ambulatory Visit | Attending: Interventional Radiology | Admitting: Interventional Radiology

## 2016-05-19 ENCOUNTER — Ambulatory Visit: Payer: PPO

## 2016-05-19 VITALS — BP 150/61 | HR 72 | Temp 101.4°F

## 2016-05-19 VITALS — BP 146/46 | HR 72 | Temp 103.2°F

## 2016-05-19 DIAGNOSIS — C250 Malignant neoplasm of head of pancreas: Secondary | ICD-10-CM

## 2016-05-19 DIAGNOSIS — Z8249 Family history of ischemic heart disease and other diseases of the circulatory system: Secondary | ICD-10-CM | POA: Diagnosis not present

## 2016-05-19 DIAGNOSIS — N390 Urinary tract infection, site not specified: Secondary | ICD-10-CM | POA: Diagnosis not present

## 2016-05-19 DIAGNOSIS — Z794 Long term (current) use of insulin: Secondary | ICD-10-CM | POA: Diagnosis not present

## 2016-05-19 DIAGNOSIS — N179 Acute kidney failure, unspecified: Secondary | ICD-10-CM

## 2016-05-19 DIAGNOSIS — I110 Hypertensive heart disease with heart failure: Secondary | ICD-10-CM | POA: Diagnosis present

## 2016-05-19 DIAGNOSIS — C801 Malignant (primary) neoplasm, unspecified: Secondary | ICD-10-CM

## 2016-05-19 DIAGNOSIS — H9192 Unspecified hearing loss, left ear: Secondary | ICD-10-CM | POA: Diagnosis not present

## 2016-05-19 DIAGNOSIS — I5032 Chronic diastolic (congestive) heart failure: Secondary | ICD-10-CM | POA: Diagnosis present

## 2016-05-19 DIAGNOSIS — F322 Major depressive disorder, single episode, severe without psychotic features: Secondary | ICD-10-CM | POA: Diagnosis not present

## 2016-05-19 DIAGNOSIS — T814XXA Infection following a procedure, initial encounter: Principal | ICD-10-CM | POA: Diagnosis present

## 2016-05-19 DIAGNOSIS — I10 Essential (primary) hypertension: Secondary | ICD-10-CM

## 2016-05-19 DIAGNOSIS — R5082 Postprocedural fever: Secondary | ICD-10-CM | POA: Diagnosis not present

## 2016-05-19 DIAGNOSIS — K830 Cholangitis: Secondary | ICD-10-CM | POA: Diagnosis present

## 2016-05-19 DIAGNOSIS — Z9981 Dependence on supplemental oxygen: Secondary | ICD-10-CM | POA: Diagnosis not present

## 2016-05-19 DIAGNOSIS — K831 Obstruction of bile duct: Principal | ICD-10-CM

## 2016-05-19 DIAGNOSIS — E785 Hyperlipidemia, unspecified: Secondary | ICD-10-CM | POA: Diagnosis present

## 2016-05-19 DIAGNOSIS — B377 Candidal sepsis: Secondary | ICD-10-CM | POA: Diagnosis not present

## 2016-05-19 DIAGNOSIS — R109 Unspecified abdominal pain: Secondary | ICD-10-CM

## 2016-05-19 DIAGNOSIS — G2581 Restless legs syndrome: Secondary | ICD-10-CM | POA: Diagnosis not present

## 2016-05-19 DIAGNOSIS — R509 Fever, unspecified: Secondary | ICD-10-CM | POA: Diagnosis not present

## 2016-05-19 DIAGNOSIS — E119 Type 2 diabetes mellitus without complications: Secondary | ICD-10-CM | POA: Diagnosis not present

## 2016-05-19 DIAGNOSIS — B3749 Other urogenital candidiasis: Secondary | ICD-10-CM | POA: Diagnosis not present

## 2016-05-19 DIAGNOSIS — Z801 Family history of malignant neoplasm of trachea, bronchus and lung: Secondary | ICD-10-CM | POA: Diagnosis not present

## 2016-05-19 DIAGNOSIS — I872 Venous insufficiency (chronic) (peripheral): Secondary | ICD-10-CM | POA: Diagnosis present

## 2016-05-19 DIAGNOSIS — R5081 Fever presenting with conditions classified elsewhere: Secondary | ICD-10-CM

## 2016-05-19 DIAGNOSIS — Z885 Allergy status to narcotic agent status: Secondary | ICD-10-CM

## 2016-05-19 DIAGNOSIS — K219 Gastro-esophageal reflux disease without esophagitis: Secondary | ICD-10-CM | POA: Diagnosis not present

## 2016-05-19 DIAGNOSIS — Z888 Allergy status to other drugs, medicaments and biological substances status: Secondary | ICD-10-CM | POA: Diagnosis not present

## 2016-05-19 DIAGNOSIS — Z823 Family history of stroke: Secondary | ICD-10-CM | POA: Diagnosis not present

## 2016-05-19 DIAGNOSIS — D649 Anemia, unspecified: Secondary | ICD-10-CM | POA: Diagnosis present

## 2016-05-19 DIAGNOSIS — G4733 Obstructive sleep apnea (adult) (pediatric): Secondary | ICD-10-CM | POA: Diagnosis present

## 2016-05-19 DIAGNOSIS — R6510 Systemic inflammatory response syndrome (SIRS) of non-infectious origin without acute organ dysfunction: Secondary | ICD-10-CM | POA: Insufficient documentation

## 2016-05-19 DIAGNOSIS — Z882 Allergy status to sulfonamides status: Secondary | ICD-10-CM | POA: Diagnosis not present

## 2016-05-19 DIAGNOSIS — C259 Malignant neoplasm of pancreas, unspecified: Secondary | ICD-10-CM

## 2016-05-19 DIAGNOSIS — Z5181 Encounter for therapeutic drug level monitoring: Secondary | ICD-10-CM | POA: Diagnosis not present

## 2016-05-19 DIAGNOSIS — E46 Unspecified protein-calorie malnutrition: Secondary | ICD-10-CM | POA: Diagnosis not present

## 2016-05-19 DIAGNOSIS — K580 Irritable bowel syndrome with diarrhea: Secondary | ICD-10-CM | POA: Diagnosis present

## 2016-05-19 DIAGNOSIS — E1142 Type 2 diabetes mellitus with diabetic polyneuropathy: Secondary | ICD-10-CM | POA: Diagnosis not present

## 2016-05-19 DIAGNOSIS — Z800 Family history of malignant neoplasm of digestive organs: Secondary | ICD-10-CM | POA: Diagnosis not present

## 2016-05-19 DIAGNOSIS — K8309 Other cholangitis: Secondary | ICD-10-CM | POA: Diagnosis present

## 2016-05-19 DIAGNOSIS — Z7982 Long term (current) use of aspirin: Secondary | ICD-10-CM | POA: Diagnosis not present

## 2016-05-19 DIAGNOSIS — R011 Cardiac murmur, unspecified: Secondary | ICD-10-CM | POA: Diagnosis not present

## 2016-05-19 DIAGNOSIS — Z9689 Presence of other specified functional implants: Secondary | ICD-10-CM | POA: Diagnosis not present

## 2016-05-19 DIAGNOSIS — G6289 Other specified polyneuropathies: Secondary | ICD-10-CM

## 2016-05-19 HISTORY — PX: IR GENERIC HISTORICAL: IMG1180011

## 2016-05-19 LAB — CBC WITH DIFFERENTIAL/PLATELET
BASOS PCT: 0 %
Basophils Absolute: 0.1 10*3/uL (ref 0.0–0.1)
Basophils Absolute: 0 10*3/uL (ref 0.0–0.1)
Basophils Relative: 1 %
EOS ABS: 0.1 10*3/uL (ref 0.0–0.7)
Eosinophils Absolute: 0.2 10*3/uL (ref 0.0–0.7)
Eosinophils Relative: 3 %
Eosinophils Relative: 1 %
HEMATOCRIT: 33.9 % — AB (ref 36.0–46.0)
HEMATOCRIT: 35 % — AB (ref 36.0–46.0)
HEMOGLOBIN: 11.5 g/dL — AB (ref 12.0–15.0)
Hemoglobin: 10.9 g/dL — ABNORMAL LOW (ref 12.0–15.0)
LYMPHS ABS: 2.2 10*3/uL (ref 0.7–4.0)
LYMPHS ABS: 1.7 10*3/uL (ref 0.7–4.0)
Lymphocytes Relative: 27 %
Lymphocytes Relative: 12 %
MCH: 27.5 pg (ref 26.0–34.0)
MCH: 28.6 pg (ref 26.0–34.0)
MCHC: 32.2 g/dL (ref 30.0–36.0)
MCHC: 32.9 g/dL (ref 30.0–36.0)
MCV: 85.6 fL (ref 78.0–100.0)
MCV: 87.1 fL (ref 78.0–100.0)
MONO ABS: 0.5 10*3/uL (ref 0.1–1.0)
MONOS PCT: 6 %
MONOS PCT: 5 %
Monocytes Absolute: 0.7 10*3/uL (ref 0.1–1.0)
NEUTROS ABS: 5.2 10*3/uL (ref 1.7–7.7)
NEUTROS ABS: 11.3 10*3/uL — AB (ref 1.7–7.7)
Neutrophils Relative %: 63 %
Neutrophils Relative %: 82 %
Platelets: 405 10*3/uL — ABNORMAL HIGH (ref 150–400)
Platelets: 385 10*3/uL (ref 150–400)
RBC: 3.96 MIL/uL (ref 3.87–5.11)
RBC: 4.02 MIL/uL (ref 3.87–5.11)
RDW: 15.7 % — AB (ref 11.5–15.5)
RDW: 15.7 % — ABNORMAL HIGH (ref 11.5–15.5)
WBC: 8.2 10*3/uL (ref 4.0–10.5)
WBC: 13.9 10*3/uL — AB (ref 4.0–10.5)

## 2016-05-19 LAB — COMPREHENSIVE METABOLIC PANEL
ALBUMIN: 2.7 g/dL — AB (ref 3.5–5.0)
ALK PHOS: 436 U/L — AB (ref 38–126)
ALT: 19 U/L (ref 14–54)
ALT: 21 U/L (ref 14–54)
ANION GAP: 15 (ref 5–15)
AST: 31 U/L (ref 15–41)
AST: 37 U/L (ref 15–41)
Albumin: 2.9 g/dL — ABNORMAL LOW (ref 3.5–5.0)
Alkaline Phosphatase: 458 U/L — ABNORMAL HIGH (ref 38–126)
Anion gap: 12 (ref 5–15)
BILIRUBIN TOTAL: 2.2 mg/dL — AB (ref 0.3–1.2)
BILIRUBIN TOTAL: 2 mg/dL — AB (ref 0.3–1.2)
BUN: 22 mg/dL — AB (ref 6–20)
BUN: 25 mg/dL — ABNORMAL HIGH (ref 6–20)
CALCIUM: 8.2 mg/dL — AB (ref 8.9–10.3)
CHLORIDE: 93 mmol/L — AB (ref 101–111)
CO2: 30 mmol/L (ref 22–32)
CO2: 31 mmol/L (ref 22–32)
Calcium: 8.7 mg/dL — ABNORMAL LOW (ref 8.9–10.3)
Chloride: 91 mmol/L — ABNORMAL LOW (ref 101–111)
Creatinine, Ser: 1.31 mg/dL — ABNORMAL HIGH (ref 0.44–1.00)
Creatinine, Ser: 1.13 mg/dL — ABNORMAL HIGH (ref 0.44–1.00)
GFR calc Af Amer: 47 mL/min — ABNORMAL LOW (ref >60)
GFR calc Af Amer: 56 mL/min — ABNORMAL LOW (ref >60)
GFR calc non Af Amer: 48 mL/min — ABNORMAL LOW (ref >60)
GFR, EST NON AFRICAN AMERICAN: 40 mL/min — AB (ref >60)
Glucose, Bld: 281 mg/dL — ABNORMAL HIGH (ref 65–99)
Glucose, Bld: 246 mg/dL — ABNORMAL HIGH (ref 65–99)
POTASSIUM: 3.3 mmol/L — AB (ref 3.5–5.1)
POTASSIUM: 3.4 mmol/L — AB (ref 3.5–5.1)
Sodium: 136 mmol/L (ref 135–145)
Sodium: 136 mmol/L (ref 135–145)
TOTAL PROTEIN: 7.5 g/dL (ref 6.5–8.1)
Total Protein: 7 g/dL (ref 6.5–8.1)

## 2016-05-19 LAB — LACTIC ACID, PLASMA: LACTIC ACID, VENOUS: 1.8 mmol/L (ref 0.5–1.9)

## 2016-05-19 LAB — PROTIME-INR
INR: 0.97
Prothrombin Time: 12.9 seconds (ref 11.4–15.2)

## 2016-05-19 LAB — GLUCOSE, CAPILLARY: Glucose-Capillary: 281 mg/dL — ABNORMAL HIGH (ref 65–99)

## 2016-05-19 LAB — PHOSPHORUS: Phosphorus: 3.7 mg/dL (ref 2.5–4.6)

## 2016-05-19 LAB — APTT: aPTT: 34 seconds (ref 24–36)

## 2016-05-19 LAB — MAGNESIUM: Magnesium: 1.2 mg/dL — ABNORMAL LOW (ref 1.7–2.4)

## 2016-05-19 MED ORDER — ROPINIROLE HCL 1 MG PO TABS
2.0000 mg | ORAL_TABLET | Freq: Two times a day (BID) | ORAL | Status: DC
  Administered 2016-05-20 – 2016-05-24 (×9): 2 mg via ORAL
  Filled 2016-05-19 (×11): qty 2

## 2016-05-19 MED ORDER — ONDANSETRON HCL 4 MG PO TABS: 4.0000 mg | ORAL_TABLET | Freq: Four times a day (QID) | ORAL | Status: DC | PRN

## 2016-05-19 MED ORDER — ONDANSETRON HCL 4 MG/2ML IJ SOLN: 4.0000 mg | Freq: Four times a day (QID) | INTRAMUSCULAR | Status: DC | PRN

## 2016-05-19 MED ORDER — POTASSIUM CHLORIDE IN NACL 20-0.9 MEQ/L-% IV SOLN
INTRAVENOUS | Status: DC
  Administered 2016-05-19 – 2016-05-20 (×2): via INTRAVENOUS
  Filled 2016-05-19 (×2): qty 1000

## 2016-05-19 MED ORDER — HEPARIN SODIUM (PORCINE) 5000 UNIT/ML IJ SOLN
5000.0000 [IU] | Freq: Three times a day (TID) | INTRAMUSCULAR | Status: DC
  Administered 2016-05-19 – 2016-05-24 (×15): 5000 [IU] via SUBCUTANEOUS
  Filled 2016-05-19 (×15): qty 1

## 2016-05-19 MED ORDER — LIDOCAINE HCL 1 % IJ SOLN
INTRAMUSCULAR | Status: AC
  Filled 2016-05-19: qty 20

## 2016-05-19 MED ORDER — OXYCODONE-ACETAMINOPHEN 5-325 MG PO TABS
1.0000 | ORAL_TABLET | Freq: Once | ORAL | Status: AC
  Administered 2016-05-19: 1 via ORAL

## 2016-05-19 MED ORDER — FENTANYL CITRATE (PF) 100 MCG/2ML IJ SOLN
INTRAMUSCULAR | Status: AC
  Filled 2016-05-19: qty 4

## 2016-05-19 MED ORDER — SODIUM CHLORIDE 0.9 % IV SOLN
INTRAVENOUS | Status: AC
  Administered 2016-05-19: 15:00:00 via INTRAVENOUS

## 2016-05-19 MED ORDER — PIPERACILLIN-TAZOBACTAM 3.375 G IVPB
3.3750 g | Freq: Three times a day (TID) | INTRAVENOUS | Status: DC
  Administered 2016-05-19 – 2016-05-24 (×14): 3.375 g via INTRAVENOUS
  Filled 2016-05-19 (×18): qty 50

## 2016-05-19 MED ORDER — MIDAZOLAM HCL 2 MG/2ML IJ SOLN
INTRAMUSCULAR | Status: AC | PRN
  Administered 2016-05-19: 1 mg via INTRAVENOUS
  Administered 2016-05-19: 0.5 mg via INTRAVENOUS
  Administered 2016-05-19 (×2): 1 mg via INTRAVENOUS

## 2016-05-19 MED ORDER — PIPERACILLIN-TAZOBACTAM 3.375 G IVPB
INTRAVENOUS | Status: AC
  Administered 2016-05-19: 3.375 g
  Filled 2016-05-19: qty 50

## 2016-05-19 MED ORDER — ACETAMINOPHEN 325 MG PO TABS
650.0000 mg | ORAL_TABLET | Freq: Four times a day (QID) | ORAL | Status: DC | PRN
  Administered 2016-05-22: 650 mg via ORAL
  Filled 2016-05-19: qty 2

## 2016-05-19 MED ORDER — SODIUM CHLORIDE 0.9 % IV BOLUS (SEPSIS)
1000.0000 mL | Freq: Once | INTRAVENOUS | Status: AC
Start: 2016-05-19 — End: 2016-05-19
  Administered 2016-05-19: 1000 mL via INTRAVENOUS

## 2016-05-19 MED ORDER — MIDAZOLAM HCL 2 MG/2ML IJ SOLN
INTRAMUSCULAR | Status: AC
Start: 2016-05-19 — End: 2016-05-19
  Filled 2016-05-19: qty 6

## 2016-05-19 MED ORDER — PIPERACILLIN-TAZOBACTAM 3.375 G IVPB: 3.3750 g | Freq: Once | INTRAVENOUS | Status: DC

## 2016-05-19 MED ORDER — SODIUM CHLORIDE 0.9 % IV SOLN
INTRAVENOUS | Status: DC
  Administered 2016-05-19: 08:00:00 via INTRAVENOUS

## 2016-05-19 MED ORDER — IOPAMIDOL (ISOVUE-300) INJECTION 61%
INTRAVENOUS | Status: AC
  Administered 2016-05-19: 20 mL
  Filled 2016-05-19: qty 50

## 2016-05-19 MED ORDER — CARVEDILOL 25 MG PO TABS
25.0000 mg | ORAL_TABLET | Freq: Two times a day (BID) | ORAL | Status: DC
  Administered 2016-05-20 – 2016-05-23 (×5): 25 mg via ORAL
  Filled 2016-05-19 (×6): qty 1

## 2016-05-19 MED ORDER — VANCOMYCIN HCL 10 G IV SOLR
1250.0000 mg | INTRAVENOUS | Status: DC
  Administered 2016-05-19: 1250 mg via INTRAVENOUS
  Filled 2016-05-19 (×3): qty 1250

## 2016-05-19 MED ORDER — INSULIN ASPART 100 UNIT/ML ~~LOC~~ SOLN
0.0000 [IU] | Freq: Three times a day (TID) | SUBCUTANEOUS | Status: DC
  Administered 2016-05-20: 3 [IU] via SUBCUTANEOUS
  Administered 2016-05-20: 7 [IU] via SUBCUTANEOUS
  Administered 2016-05-20: 3 [IU] via SUBCUTANEOUS
  Administered 2016-05-21: 7 [IU] via SUBCUTANEOUS
  Administered 2016-05-21 – 2016-05-22 (×3): 3 [IU] via SUBCUTANEOUS
  Administered 2016-05-22 (×2): 2 [IU] via SUBCUTANEOUS
  Administered 2016-05-23: 1 [IU] via SUBCUTANEOUS
  Administered 2016-05-23 (×2): 2 [IU] via SUBCUTANEOUS
  Administered 2016-05-24: 1 [IU] via SUBCUTANEOUS
  Administered 2016-05-24: 2 [IU] via SUBCUTANEOUS

## 2016-05-19 MED ORDER — INSULIN GLARGINE 100 UNIT/ML ~~LOC~~ SOLN
30.0000 [IU] | Freq: Every day | SUBCUTANEOUS | Status: DC
Start: 2016-05-20 — End: 2016-05-24
  Administered 2016-05-20 – 2016-05-24 (×5): 30 [IU] via SUBCUTANEOUS
  Filled 2016-05-19 (×5): qty 0.3

## 2016-05-19 MED ORDER — ROPINIROLE HCL ER 4 MG PO TB24: 4.0000 mg | ORAL_TABLET | Freq: Every day | ORAL | Status: DC

## 2016-05-19 MED ORDER — FENTANYL CITRATE (PF) 100 MCG/2ML IJ SOLN
INTRAMUSCULAR | Status: AC | PRN
  Administered 2016-05-19 (×2): 50 ug via INTRAVENOUS

## 2016-05-19 MED ORDER — LACTULOSE 10 GM/15ML PO SOLN
20.0000 g | Freq: Every day | ORAL | Status: DC
  Administered 2016-05-20 – 2016-05-24 (×5): 20 g via ORAL
  Filled 2016-05-19 (×6): qty 30

## 2016-05-19 MED ORDER — IOPAMIDOL (ISOVUE-300) INJECTION 61%
20.0000 mL | Freq: Once | INTRAVENOUS | Status: AC | PRN
  Administered 2016-05-19: 20 mL

## 2016-05-19 MED ORDER — LIDOCAINE HCL 1 % IJ SOLN
INTRAMUSCULAR | Status: AC | PRN
  Administered 2016-05-19: 10 mL

## 2016-05-19 MED ORDER — CEFAZOLIN SODIUM-DEXTROSE 2-4 GM/100ML-% IV SOLN: 2.0000 g | Freq: Once | INTRAVENOUS | Status: DC

## 2016-05-19 MED ORDER — OXYCODONE-ACETAMINOPHEN 5-325 MG PO TABS
ORAL_TABLET | ORAL | Status: AC
  Filled 2016-05-19: qty 1

## 2016-05-19 MED ORDER — SODIUM CHLORIDE 0.9 % IV SOLN: INTRAVENOUS | Status: DC

## 2016-05-19 MED ORDER — MAGNESIUM SULFATE 4 GM/100ML IV SOLN
4.0000 g | Freq: Once | INTRAVENOUS | Status: AC
  Administered 2016-05-20: 4 g via INTRAVENOUS
  Filled 2016-05-19 (×2): qty 100

## 2016-05-19 MED ORDER — KETOROLAC TROMETHAMINE 15 MG/ML IJ SOLN
15.0000 mg | Freq: Once | INTRAMUSCULAR | Status: AC
  Administered 2016-05-19: 15 mg via INTRAVENOUS
  Filled 2016-05-19: qty 1

## 2016-05-19 MED ORDER — ENSURE ENLIVE PO LIQD
237.0000 mL | Freq: Three times a day (TID) | ORAL | Status: DC
  Administered 2016-05-20 – 2016-05-24 (×10): 237 mL via ORAL

## 2016-05-19 MED ORDER — PREGABALIN 50 MG PO CAPS
200.0000 mg | ORAL_CAPSULE | Freq: Two times a day (BID) | ORAL | Status: DC
  Administered 2016-05-20 – 2016-05-24 (×9): 200 mg via ORAL
  Filled 2016-05-19 (×9): qty 1

## 2016-05-19 NOTE — Progress Notes (Signed)
Report called to [facility]. Pt d/c from Short stay with family to appt at cancer center.

## 2016-05-19 NOTE — Sedation Documentation (Signed)
Patient is resting comfortably. 

## 2016-05-19 NOTE — H&P (Addendum)
History and Physical    Melissa Myers FIE:[id number] DOB: 12-23-1945 DOA: 05/19/2016  PCP: [name], MD   Patient coming from: [place].  I have personally briefly reviewed patient's old medical records in [place]  Chief Complaint: Fever.  HPI: Melissa Myers is a 71 y.o. female with medical history significant of anemia, lower extremity cellulitis, IBS with chronic diarrhea, diastolic CHF, diabetic peripheral neuropathy, GERD, hyperlipidemia, hypertension, obstructive sleep apnea, restless leg syndrome, osteoarthritis, status is dermatitis, type 2 diabetes, pancreatic cancer who is being referred by the Bureau for treatment of fever after undergoing percutaneous transhepatic biliary drainage this morning. The patient received IV fluids and Zosyn prior to arrival. However, once on the floor the patient had a temperature of 103.15F and was delirious. She subsequently improved with IV fluids, Toradol 15 mg IVP, vancomycin and Zosyn.   She recently had a biliary drain fluid positive for Enterococcus faecalis and Pantoea species on 05/07/2016. She also had a urine culture positive for Klebsiella pneumoniae. No further history available from the patient. I try to communicate with the patient's husband Melissa Myers, but was unable to contact him to obtain further information..  She is currently afebrile and in no acute distress.  ED Course: Not applied.  Review of Systems: As per HPI otherwise 10 point review of systems negative.    Past Medical History:  Diagnosis Date  . Anemia   . Cellulitis    LOWER EXTREMITIES  . Chronic diarrhea    a/w nausea - felt related to IBS  . Deaf    left side only  . Diastolic CHF ([place])   . Disc degeneration, lumbar   . GERD (gastroesophageal reflux disease)   . Hyperlipidemia    hx rhabdo on statins  . Hypertension   . Neuropathy (HCC)    feet, toes and fingers  . On home oxygen therapy    uses oxygen 2  liters min per [place] at night and prn during day  . OSA (obstructive sleep apnea)    05/2009 sleep study - refuses CPAP  . Osteoarthritis   . RLS (restless legs syndrome)   . Shortness of breath    chronic  . Stasis dermatitis   . Type II or unspecified type diabetes mellitus without mention of complication, not stated as uncontrolled    insulin dep    Past Surgical History:  Procedure Laterality Date  . BILIARY STENT PLACEMENT N/A 05/06/2016   Procedure: BILIARY STENT PLACEMENT;  Surgeon: Gatha Mayer, MD;  Location: [facility];  Service: Endoscopy;  Laterality: N/A;  . CHOLECYSTECTOMY  1997  . COLONOSCOPY N/A 12/03/2012   Procedure: COLONOSCOPY;  Surgeon: Lafayette Dragon, MD;  Location: [facility];  Service: Endoscopy;  Laterality: N/A;  . ENDOSCOPIC RETROGRADE CHOLANGIOPANCREATOGRAPHY (ERCP) WITH PROPOFOL N/A 05/06/2016   Procedure: ENDOSCOPIC RETROGRADE CHOLANGIOPANCREATOGRAPHY (ERCP) WITH PROPOFOL;  Surgeon: Gatha Mayer, MD;  Location: [facility];  Service: Endoscopy;  Laterality: N/A;  . ERCP N/A 03/18/2016   Procedure: ENDOSCOPIC RETROGRADE CHOLANGIOPANCREATOGRAPHY (ERCP);  Surgeon: Irene Shipper, MD;  Location: [facility];  Service: Endoscopy;  Laterality: N/A;  . EUS N/A 04/28/2016   Procedure: UPPER ENDOSCOPIC ULTRASOUND (EUS) LINEAR;  Surgeon: [name], MD;  Location: [facility];  Service: Endoscopy;  Laterality: N/A;  . IR GENERIC HISTORICAL  05/07/2016   IR BILIARY DRAIN PLACEMENT WITH CHOLANGIOGRAM 05/07/2016 Jacqulynn Cadet, MD [facility]  . IR GENERIC HISTORICAL  05/19/2016   IR BILIARY STENT(S)  EXISTING ACCESS INC DILATION CATH EXCHANGE 05/19/2016 Jacqulynn Cadet, MD [facility]  . TONSILLECTOMY  1970  . TUBAL LIGATION  1980  . UMBILICAL HERNIA REPAIR  1995  . uterine polyp removal  2008     reports that she has never smoked. She has never used smokeless tobacco. She reports that she does not drink alcohol or use drugs.  Allergies  Allergen  Reactions  . Cymbalta [Duloxetine Hcl] Other (See Comments)    Restless leg syndrome  . Statins Other (See Comments)    Statin drugs cause muscle pain / "muscle damage"--was told by MD not to take  . Sulfa Antibiotics Diarrhea  . Levemir [Insulin Detemir] Itching  . Morphine Other (See Comments)    GI upset and headaches  . Zinc Swelling and Rash    Family History  Problem Relation Age of Onset  . Heart disease Mother   . Heart attack Mother 63  . Heart disease Father   . Heart attack Father 51  . Heart disease      family history  . Stomach cancer Paternal Grandmother   . Lung cancer Paternal Grandfather   . CVA      several aunts  . Heart attack      several aunts and an uncle  . Colon cancer Neg Hx     Prior to Admission medications   Medication Sig Start Date End Date Taking? Authorizing Provider  aspirin EC 81 MG tablet Take 1 tablet (81 mg total) by mouth daily. 07/08/14  Yes Rowe Clack, MD  carvedilol (COREG) 25 MG tablet TAKE 1 TABLET BY MOUTH TWICE DAILY WITH FOOD 01/20/16  Yes Jolaine Artist, MD  Cholecalciferol (VITAMIN D3) 2000 units capsule Take 1 capsule (2,000 Units total) by mouth daily. 06/17/15  Yes Janith Lima, MD  citalopram (CELEXA) 20 MG tablet TAKE 1 TABLET (20 MG TOTAL) BY MOUTH DAILY. 01/27/16  Yes [name], MD  DULoxetine (CYMBALTA) 60 MG capsule Take 1 capsule (60 mg total) by mouth daily. 09/25/15  Yes [name], MD  ezetimibe (ZETIA) 10 MG tablet TAKE ONE TABLET BY MOUTH DAILY 02/03/16  Yes [name], MD  gabapentin (NEURONTIN) 300 MG capsule TAKE TWO CAPSULES BY MOUTH FOUR TIMES A DAY 01/19/16  Yes [name], MD  hydrALAZINE (APRESOLINE) 25 MG tablet TAKE 1 TABLET (25 MG TOTAL) BY MOUTH 3 (THREE) TIMES DAILY. 02/16/16  Yes Jolaine Artist, MD  HYDROcodone-acetaminophen (NORCO) 10-325 MG tablet Take 1 tablet by mouth every 6 (six) hours as needed. Patient taking differently: Take 1 tablet by  mouth every 6 (six) hours as needed (pain).  05/13/16  Yes Patrecia Pour, MD  insulin glargine (LANTUS) 100 UNIT/ML injection Inject 0.3 mLs (30 Units total) into the skin daily. 03/22/16  Yes Mariel Aloe, MD  lactulose (CHRONULAC) 10 GM/15ML solution Take 30 mLs (20 g total) by mouth daily. 05/14/16  Yes Patrecia Pour, MD  LYRICA 200 MG capsule TAKE ONE CAPSULE BY MOUTH TWICE A DAY 04/07/16  Yes [name], MD  metFORMIN (GLUCOPHAGE) 500 MG tablet Take 500 mg by mouth daily with breakfast.   Yes Historical Provider, MD  metolazone (ZAROXOLYN) 2.5 MG tablet Take 1 tablet (2.5 mg total) by mouth 2 (two) times a week. 12/17/15  Yes Satira Mccallum Tillery, PA-C  NON FORMULARY Take 120 mLs by mouth 3 (three) times daily. Sugar-free "MedPass" for supplement.   Yes Historical Provider, MD  rOPINIRole (  REQUIP XL) 4 MG 24 hr tablet TAKE 1 TABLET (4 MG TOTAL) BY MOUTH AT BEDTIME. 02/23/16  Yes [name], MD  spironolactone (ALDACTONE) 25 MG tablet TAKE ONE TABLET EACH DAY 04/21/16  Yes Larey Dresser, MD  torsemide (DEMADEX) 100 MG tablet Take 1 tablet (100 mg total) by mouth daily. 03/21/16  Yes Mariel Aloe, MD  vitamin B-12 (CYANOCOBALAMIN) 500 MCG tablet Take 500 mcg by mouth daily. Reported on 09/09/2015   Yes Historical Provider, MD  feeding supplement, ENSURE ENLIVE, (ENSURE ENLIVE) LIQD Take 237 mLs by mouth 3 (three) times daily between meals. 05/13/16   Patrecia Pour, MD  insulin aspart (NOVOLOG) 100 UNIT/ML injection Inject 0-20 Units into the skin 3 (three) times daily with meals. . CBG 70 - 120: 0 units CBG 121 - 150: 3 units CBG 151 - 200: 4 units CBG 201 - 250: 7 units CBG 251 - 300: 11 units CBG 301 - 350: 15 units CBG 351 - 400: 20 units 03/21/16   Mariel Aloe, MD    Physical Exam:  Constitutional: Febrile, looks chronically ill. Vitals:   05/19/16 1750 05/19/16 1948  BP: (!) 133/48 (!) 125/49  Pulse: 75 77  Resp: 20 20  Temp: (!) 102.4 F (39.1 C) 100.1 F (37.8  C)  TempSrc: Oral Oral  SpO2: 100% 100%  Weight: 102 kg (224 lb 13.9 oz)   Height: 5\' 2"  (1.575 m)    Eyes: PERRL, lids and conjunctivae normal ENMT: Mucous membranes are dry. Posterior pharynx clear of any exudate or lesions. Neck: normal, supple, no masses, no thyromegaly Respiratory: clear to auscultation bilaterally, no wheezing, no crackles. Normal respiratory effort. No accessory muscle use.  Cardiovascular: Regular rate and rhythm, no murmurs / rubs / gallops. No extremity edema. 2+ pedal pulses. No carotid bruits.  Abdomen: Soft, no tenderness, no masses palpated. No hepatosplenomegaly. Bowel sounds positive.  Musculoskeletal: no clubbing / cyanosis.  Good ROM, no contractures. Normal muscle tone.  Skin: no rashes, lesions, ulcers on limited skin exam. Neurologic: Follows simple commands. Moves all extremities. Unable to fully evaluate. Psychiatric: sleepy, wakes up and answers some questions, oriented to person, thinks is [facility], disoriented to time. Delirius mood.    Labs on Admission: I have personally reviewed following labs and imaging studies  CBC:  Recent Labs Lab 05/19/16 0726 05/19/16 1906  WBC 8.2 13.9*  NEUTROABS 5.2 11.3*  HGB 10.9* 11.5*  HCT 33.9* 35.0*  MCV 85.6 87.1  PLT 405* 379   Basic Metabolic Panel:  Recent Labs Lab 05/13/16 0413 05/19/16 0726 05/19/16 1906 05/19/16 1920  NA 139 136 136  --   K 3.5 3.3* 3.4*  --   CL 101 91* 93*  --   CO2 29 30 31   --   GLUCOSE 179* 281* 246*  --   BUN <5* 22* 25*  --   CREATININE 0.61 1.31* 1.13*  --   CALCIUM 8.7* 8.7* 8.2*  --   MG  --   --  1.2*  --   PHOS  --   --   --  3.7   GFR: Estimated Creatinine Clearance: 51.9 mL/min (A) (by C-G formula based on SCr of 1.13 mg/dL (H)). Liver Function Tests:  Recent Labs Lab 05/13/16 0413 05/19/16 0726 05/19/16 1906  AST 30 31 37  ALT 24 19 21   ALKPHOS 596* 458* 436*  BILITOT 2.4* 2.2* 2.0*  PROT 5.6* 7.5 7.0  ALBUMIN 1.7* 2.9* 2.7*   No  results  for input(s): LIPASE, AMYLASE in the last 168 hours. No results for input(s): AMMONIA in the last 168 hours. Coagulation Profile:  Recent Labs Lab 05/19/16 0726  INR 0.97   Cardiac Enzymes: No results for input(s): CKTOTAL, CKMB, CKMBINDEX, TROPONINI in the last 168 hours. BNP (last 3 results)  Recent Labs  11/04/15 0956  PROBNP 20.0   HbA1C: No results for input(s): HGBA1C in the last 72 hours. CBG:  Recent Labs Lab 05/13/16 0015 05/13/16 0408 05/13/16 0805 05/13/16 1207 05/19/16 0709  GLUCAP 195* 172* 127* 126* 281*   Lipid Profile: No results for input(s): CHOL, HDL, LDLCALC, TRIG, CHOLHDL, LDLDIRECT in the last 72 hours. Thyroid Function Tests: No results for input(s): TSH, T4TOTAL, FREET4, T3FREE, THYROIDAB in the last 72 hours. Anemia Panel: No results for input(s): VITAMINB12, FOLATE, FERRITIN, TIBC, IRON, RETICCTPCT in the last 72 hours. Urine analysis:    Component Value Date/Time   COLORURINE YELLOW 05/05/2016 0219   APPEARANCEUR CLEAR 05/05/2016 0219   LABSPEC 1.007 05/05/2016 0219   PHURINE 5.0 05/05/2016 0219   GLUCOSEU >=500 (A) 05/05/2016 0219   GLUCOSEU NEGATIVE 11/04/2015 0956   HGBUR NEGATIVE 05/05/2016 0219   BILIRUBINUR NEGATIVE 05/05/2016 0219   BILIRUBINUR neg 08/17/2015 1547   KETONESUR NEGATIVE 05/05/2016 0219   PROTEINUR NEGATIVE 05/05/2016 0219   UROBILINOGEN 0.2 11/04/2015 0956   NITRITE POSITIVE (A) 05/05/2016 0219   LEUKOCYTESUR TRACE (A) 05/05/2016 0219    Radiological Exams on Admission: Ir Biliary Stent(s) Existing Access Inc Dilation Cath Exchange  Result Date: 05/19/2016 INDICATION: 71 year old female with pancreatic cancer and malignant obstructed jaundice. A percutaneous transhepatic biliary drain was placed on 05/07/2016 via a right posterior duct approach. She is now doing better and presents for placement of an internal stent. EXAM: 1. Cholangiogram through existing access 2. Placement of a 10 x 60 mm wall flex self  expanding covered metallic stent 3. Biliary drain exchange MEDICATIONS: 3.375 g Zosyn; The antibiotic was administered within an appropriate time frame prior to the initiation of the procedure. ANESTHESIA/SEDATION: Moderate (conscious) sedation was employed during this procedure. A total of Versed 3.5 mg and Fentanyl 100 mcg was administered intravenously. Moderate Sedation Time: 14 minutes. The patient's level of consciousness and vital signs were monitored continuously by radiology nursing throughout the procedure under my direct supervision. FLUOROSCOPY TIME:  Fluoroscopy Time: 0 minutes 5 seconds (48 mGy). COMPLICATIONS: SIR LEVEL B - Normal therapy, includes overnight admission for observation. Approximately 2 hours after discharge the patient became febrile with rigors while in her oncologist's office. She was admitted overnight for observation, IV fluids and additional antibiotics. PROCEDURE: Informed written consent was obtained from the patient after a thorough discussion of the procedural risks, benefits and alternatives. All questions were addressed. Maximal Sterile Barrier Technique was utilized including caps, mask, sterile gowns, sterile gloves, sterile drape, hand hygiene and skin antiseptic. A timeout was performed prior to the initiation of the procedure. The existing 10 [place] biliary tube was cut and removed over an Amplatz wire. An 8 [place] vascular sheath was advanced over the wire and into the duodenum. A pull-back cholangiogram was performed. Persisting complete long segment occlusion of the common bile duct. There is some persistent intra and extrahepatic biliary ductal dilatation. No obvious debris or stones. While keeping the Amplatz wire within the duodenum as a safety wire, an angled catheter was advanced through the 8 French sheath coaxially and positioned in the duodenum. The tip of the [place] wire was then advanced through the [place]  the catheter in used to measure the length of the  common bile duct stenosis which measures approximately 5 cm. Therefore, we will proceed with placement of a 10 x 60 mm wall flex self expanding covered metal biliary stent. The stent was selected in advanced over the Amplatz wire. The stent was then successfully deployed. There was excellent opening of the proximal and distal landing zone and partial opening in the region of the stricture. There was immediate evacuation of contrast an bile through the stent. Given the patient's past history of colon check his, the decision was made to leave a internal biliary drain into a we have confirmed that the stent drainage is sufficient. The Amplatz wire was withdrawn through the stent and coiled in the hepatic biliary confluence. The 8 French sheath was removed. A new 10.2 [place] cook all-purpose drainage catheter was advanced over the wire and coiled in the region of the biliary confluence. The catheter was gently flushed and capped. The patient tolerated the procedure well. IMPRESSION: 1. 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 2. Successful placement of a 10 x 60 wall flex self expanding covered metallic stent. 3. A 10 French internal biliary drain was left in place at the hepatic confluence proximal to the stent. This tube will be left capped for a physiologic trial to insure that the metal stent is sufficient. PLAN: 1. Patient developed fever and rigors several hours following the procedure requiring admission for observation. This is likely secondary to transient bacteremia related to colonization of the bile and today's manipulation. 2. Biliary drain should be left to bag drainage overnight. If the patient is feeling well and ready for discharge tomorrow, the drain can be capped prior to discharge. 3. Patient to return Interventional Radiology in 4 weeks for cholangiogram and possible drain removal. A complete metabolic panel will be drawn at that time to assess bilirubin. 4. The family  understands that if she develops symptoms of recurrent biliary obstruction or jaundice during the four-week physiologic trial they should immediately connect the drainage catheter back to gravity bag drainage. A was noted the patient is currently in a rehab facility. Therefore, these instructions were written down on a progress note for the family to give to her caregivers. Signed, Criselda Peaches, MD [facility] Radiology Electronically Signed   By: Jacqulynn Cadet M.D.   On: 05/19/2016 16:45   Echocardiogram 11/30/2015 ------------------------------------------------------------------- LV EF: 65% -   70%  ------------------------------------------------------------------- Indications:      CHF - 428.0.  ------------------------------------------------------------------- History:   PMH:   Dyspnea.  Risk factors:  Hypertension. Diabetes mellitus.  ------------------------------------------------------------------- Study Conclusions  - Left ventricle: The cavity size was normal. There was mild   concentric hypertrophy. Systolic function was vigorous. The   estimated ejection fraction was in the range of 65% to 70%. Wall   motion was normal; there were no regional wall motion   abnormalities. Doppler parameters are consistent with abnormal   left ventricular relaxation (grade 1 diastolic dysfunction).   Doppler parameters are consistent with high ventricular filling   pressure. - Aortic valve: Transvalvular velocity was within the normal range.   There was no stenosis. - Mitral valve: Calcified annulus. Transvalvular velocity was   within the normal range. There was no evidence for stenosis.   There was no regurgitation. - Right ventricle: The cavity size was normal. Wall thickness was   normal. Systolic function was normal. - Atrial septum: No defect or  patent foramen ovale was identified   by color flow Doppler. - Tricuspid  valve: There was trivial regurgitation. - Pulmonary arteries: Systolic pressure was within the normal   range. PA peak pressure: 19 mm Hg (S).   Assessment/Plan Principal Problem:   Postprocedural fever   SIRS (systemic inflammatory response syndrome) (HCC) Likely related to procedure induced bacteremia. Lactic acid is normal. Admit to telemetry/observation. Continue supplemental oxygen. NS 1 liter IV bolus. NS+KCl 20 meq at 125 mL/hr for 24 hours. Vancomycin per pharmacy. Zosyn per pharmacy. Unknown if blood cultures were drawn earlier prior to antibiotic therapy.  Active Problems:   Hyperbilirubinemia Continue percutaneous transhepatic biliary drainage. Follow-up LFT's in AM.    Pancreatic mass As above. Patient to follow up with oncology as scheduled. Seen by palliative care on 05/12/2016. Consider reevaluation if no improvement.    Essential hypertension Hold antihypertensives, except for Carvedilol. Monitor blood pressure.    Chronic diastolic heart failure (HCC) Hold Torsemide, spironolactone, hydralazine for now. Will continue Carvedilol 25 mg po bid.    MDD (major depressive disorder), single episode, severe ([place]) Resume Cymbalta and Celexa in AM.    Diabetic peripheral neuropathy (HCC) Continue Pregabalin once more awake and able to swallow.    Dyslipidemia Hold Zetia until more alert.    RESTLESS LEGS SYNDROME Continue Requip once more alert.    DVT prophylaxis: Heparin SQ. Code Status: Full code. Family Communication: Left message for MR. Jannely Henthorn at [phone] and called twice to cell phone [phone]  Disposition Plan: Admit for observation and IV antibiotoic therapy. Consults called:  Admission status: Observation/Telemetry.   [name] MD [facility] Pager [phone].  If 7PM-7AM, please contact night-coverage [email or web address] Password [facility]  05/19/2016, 10:06 PM

## 2016-05-19 NOTE — Procedures (Signed)
Interventional Radiology Procedure Note  Procedure: Cholangiogram and placement of a 10x60 mm covered WalFlex biliary stent.  A 40F internal drain was left at the biliary confluence above the stent.  Tube is capped.    Complications: None  Estimated Blood Loss: None  Recommendations: - Return to rehab - Tube capped unless she develops sx of recurrent biliary obstruction at which point the tube should be connected to bag drainage - Return to IR in 4 weeks for CMP and cholangiogram prior to tube removal  Signed,  Criselda Peaches, MD

## 2016-05-19 NOTE — Sedation Documentation (Signed)
Patient denies pain and is resting comfortably.  

## 2016-05-19 NOTE — Progress Notes (Addendum)
Pharmacy Antibiotic Note  [name] is a 71 y.o. female admitted on 05/19/2016 with IAI.  Pharmacy has been consulted for Zosyn/vanc dosing.  Plan: Zosyn 3.375g IV q8h (4 hour infusion).  Vancomycin 1250mg  IV q24  Height: 5\' 2"  (157.5 cm) Weight: 224 lb 13.9 oz (102 kg) IBW/kg (Calculated) : 50.1  Temp (24hrs), Avg:100.7 F (38.2 C), Min:98.1 F (36.7 C), Max:103.2 F (39.6 C)   Recent Labs Lab 05/13/16 0413 05/19/16 0726  WBC  --  8.2  CREATININE 0.61 1.31*    Estimated Creatinine Clearance: 44.7 mL/min (A) (by C-G formula based on SCr of 1.31 mg/dL (H)).    Allergies  Allergen Reactions  . Cymbalta [Duloxetine Hcl] Other (See Comments)    Restless leg syndrome  . Statins Other (See Comments)    Statin drugs cause muscle pain / "muscle damage"--was told by MD not to take  . Sulfa Antibiotics Diarrhea  . Levemir [Insulin Detemir] Itching  . Morphine Other (See Comments)    GI upset and headaches  . Zinc Swelling and Rash     Thank you for allowing pharmacy to be a part of this patient's care.   Melissa Myers, PharmD, BCPS Pager [phone] 05/19/2016 6:42 PM

## 2016-05-19 NOTE — Progress Notes (Signed)
Rapid Response Event Note  Overview: Called to room 1431 at 2210, patient had just been transferred from 3rd floor to telemetry. Called due to change in patient's mental status. Nurse was concerned patient was getting septic.      Initial Focused Assessment: Temp: 97.7 B/P: 126/64 (82) 02: 100% on 4L [place] RR: 18-20 HR: 68-72; NSR with BBB Patient denies pain GCS: 13 Patient is slightly confused and lethargic but will follow commands, knows her birthday (month and day), and could tell me what kind of cancer she has.   Interventions: Stayed with patient and monitored vital signs.  Plan of Care (if not transferred): Called and informed MD of RRT call. He just stated that if she gets worse to call him back. Told RN if she needs me to just call me and I will come and look at her again. Recommended that she take her BP at 0200, instead of waiting until 0400.  Event Summary:   RRT at  2215    completed at  [place], [place]

## 2016-05-19 NOTE — Discharge Instructions (Addendum)
Moderate Conscious Sedation, Adult, Care After These instructions provide you with information about caring for yourself after your procedure. Your health care provider may also give you more specific instructions. Your treatment has been planned according to current medical practices, but problems sometimes occur. Call your health care provider if you have any problems or questions after your procedure. What can I expect after the procedure? After your procedure, it is common:  To feel sleepy for several hours.  To feel clumsy and have poor balance for several hours.  To have poor judgment for several hours.  To vomit if you eat too soon. Follow these instructions at home: For at least 24 hours after the procedure:    Do not:  Participate in activities where you could fall or become injured.  Drive.  Use heavy machinery.  Drink alcohol.  Take sleeping pills or medicines that cause drowsiness.  Make important decisions or sign legal documents.  Take care of children on your own.  Rest. Eating and drinking   Follow the diet recommended by your health care provider.  If you vomit:  Drink water, juice, or soup when you can drink without vomiting.  Make sure you have little or no nausea before eating solid foods. General instructions   Have a responsible adult stay with you until you are awake and alert.  Take over-the-counter and prescription medicines only as told by your health care provider.  If you smoke, do not smoke without supervision.  Keep all follow-up visits as told by your health care provider. This is important. Contact a health care provider if:  You keep feeling nauseous or you keep vomiting.  You feel light-headed.  You develop a rash.  You have a fever. Get help right away if:  You have trouble breathing. This information is not intended to replace advice given to you by your health care provider. Make sure you discuss any questions you  have with your health care provider. Document Released: 11/28/2012 Document Revised: 07/13/2015 Document Reviewed: 05/30/2015 Elsevier Interactive Patient Education  2017 [employer]. Biliary Drainage Catheter Home Guide A biliary drainage catheter is a thin, flexible tube that is inserted through your skin into the bile ducts in your liver. Bile is a thick yellow or green fluid that helps digest fat in foods. The purpose of a biliary drainage catheter is to keep bile from backing up into your liver. Backup of bile can occur when there is a blockage that prevents bile from moving from the bile ducts into the small intestine as it should. The blockage can be caused by gallstones, a tumor, or scar tissue. There are three types of biliary drainage:  External biliary drainage. With this type, bile is only drained into a collection bag outside your body (external collection bag).  Internal-external biliary drainage. With this type, bile is drained to an external collection bag as well as into your small intestine.  Internal biliary drainage. With this type, bile is only drained into your small intestine. General home care includes these daily actions:  Inspection of your drainage catheter.  Flushing your drainage catheter with saline.  Emptying drainage from the collection bag (if present).  Recording the amount of drainage.  Checking the catheter insertion site for signs of infection. Check for:  Redness, swelling, or pain.  Fluid or blood.  Warmth.  Pus or a bad smell. How do I inspect my drainage catheter?  Check the dressing to make sure that it is dry and clean.  Look at the skin around the drainage catheter when changing the dressing for any problems such as redness, rash, or skin breakdown.  Check the drainage bag to make sure that drainage fluid is flowing into the bag well. Note the color and amount compared to other days.  Check the drainage catheter and bag for any  cracks or kinks in the tubing. How do I change my dressing? The dressing over the drainage catheter should be changed every other day, or more often if needed to keep the dressing dry. Your health care provider will instruct you about how often to change your dressing. Supplies needed:   Mild soap and warm water.  Split gauze pads, 4 x 4 inches (10 x 10 cm) to use as a dressing sponge.  Gauze pads, 4 x 4 inches (10 x 10 cm) or adhesive dressing cover.  Paper tape. How to change the dressing:  1. Wash your hands with soap and water. 2. Gently remove the old dressing. Avoid using scissors to remove the dressing because they may damage the drainage catheter. 3. Wash the skin around the insertion site with mild soap and warm water, rinse well, then pat the area dry with a clean cloth. 4. Check the skin around the drainage catheter for redness or swelling, or for yellow or green discharge that has a bad smell. 5. If the drainage catheter was stitched (sutured) to the skin, inspect the suture to make sure it is still anchored in the skin. 6. Do not apply creams, ointments, or alcohol to the site. Allow the skin to air-dry completely before you apply a new dressing. 7. Place the drainage catheter through the slit in a dressing sponge. The dressing sponge should slide under the disk that holds the drainage catheter in place. 8. Cover the drainage catheter and the dressing sponge with a 4 x 4 inch (10 x 10 cm) gauze. The drainage catheter should rest on the gauze and not on the skin. 9. Tape the dressing to the skin. 10. You may be instructed to use an adhesive dressing covering over the top of this in place of the gauze and tape. 11. Wash your hands with soap and water. How do I flush my drainage catheter? Biliary drainagecatheters should be flushed daily, or as often as told by your health care provider. The end of the drainage catheter is closed using an IV cap. A syringe can be directly connected  to the IV cap. Supplies needed:   Alcohol swab.  10 mL prefilled normal saline syringe. How to flush the drainage catheter:  1. Wash your hands with soap and water. 2. If your drainage catheter has a stopcock attached to it, turn the stopcock toward the drainage bag. This will allow the saline to flow in the direction of your body. 3. Clean the IV cap with an alcohol swab. 4. Screw the tip of a 10 mL normal saline syringe onto the IV cap. 5. Inject the saline over 5-10 seconds. If you feel resistance while injecting, stop immediately. Avoid  pulling back on the plunger. Doing that could increase your risk of infection. 6. Remove the syringe from the cap. Turn the stopcock so that fluid flows from your body into the drainage bag. You may notice more fluid flowing into the bag after you have completed the flush. How do I attach a bag to my drainage catheter? If you are having trouble with your internal biliary drain, you may be directed by your health  care provider to use bag drainage until you can be seen to fix the problem. For this reason, you should always have a collection bag and connecting tubing at home. If you do not have these supplies, remember to ask for them at your next appointment. 1. Remove the bag and the connecting tubing from their packaging. 2. Connect the funnel end of the tubing to the bag's cone-shaped stem. 3. Remove the IV cap from the biliary drain. To do this, unscrew it and replace it with the screw-on end of the tubing. 4.  How do I empty my collection bag? Empty the collection bag whenever it becomes 2/3 full. Also empty it before you go to sleep. Most collection bags have a drainage valve at the bottom so the bag can be that allows them to be emptied easily. 1. Wash your hands with soap and water. 2. Hold the collection bag over the toilet, basin, or collection container. Use a measuring container if your health care provider told you to measure the  drainage. 3. Unscrew the valve to open it, and allow the bag to drain. 4. Close the valve securely to avoid leakage. 5. Use a tissue or disposable napkin to wipe the valve clean. 6. Wash the measuring container with soap and water. 7. Record the amount of drainage as told by your health care provider. Contact a health care provider if:  Your pain gets worse after it had improved, and it is not relieved with pain medicines.  You have any questions about caring for your drainage catheter or collection bag.  You have any of these around your catheter insertion site or coming from it:  Skin breakdown.  Redness, swelling, or pain.  Fluid or blood.  Warmth to the touch.  Pus or a bad smell. Get help right away if:  You have a fever or chills.  Your redness, swelling, or pain at the catheter insertion site gets worse, even though you are cleaning it well.  You have leakage of bile around the drainage catheter.  Your drainage catheter becomes blocked or clogged.  Your drainage catheter comes out. This information is not intended to replace advice given to you by your health care provider. Make sure you discuss any questions you have with your health care provider. Document Released: 11/28/2012 Document Revised: 12/28/2015 Document Reviewed: 12/28/2015 Elsevier Interactive Patient Education  2017 [employer].

## 2016-05-19 NOTE — Progress Notes (Signed)
Received pt from Dr. Ernestina [facility]na [facility] @ approx. 1500 with new onset fever, increasing right sided abd pain. She is s/p biliary stent replacement as of approx 10 am this morning. IV started per Royce Macadamia, RN - poor venous access. Pt has #24 in L hand near pinky finger. Fluids @ 566ml/hour  Lab in to do blood cultures. Noted nailbeds to be dusky. S02 90%  Also temp increasing. Dr. Burr Medico aware. Started 02 @ 2l/min See VS flow sheet  Pt to be admitted per Dr. Burr Medico .  Hospitalist Dr. Posey Pronto  Report called to [facility]. Transported via w/c and 02 tank. Pt required total assist per 2-3 nurses to go from recliner to w/c and w/c to bed on [facility].  Daughter and pt's husband in [place]

## 2016-05-19 NOTE — Progress Notes (Signed)
Prior to transfer to [facility]. External biliary drain connected to drainage bag. Draining bile colored fluid easily.

## 2016-05-19 NOTE — H&P (Signed)
Referring Physician(s): Gessner,C/Feng,Y  Supervising Physician: Jacqulynn Cadet  Patient Status:  [facility]  Chief Complaint:  Pancreatic cancer with biliary obstruction  Subjective: Patient familiar to IR service from recent biliary drain placement on 05/07/16. 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 She developed cholangitis shortly afterwards and had removal of clogged plastic biliary stent via ERCP on 3/16. A new stent was unable to be placed. She then underwent internal/external biliary drain placement by IR on 3/17. Bile cultures grew enterococcus and pantoea species. She presents today for follow-up cholangiogram and attempted biliary stent placement. She currently denies fever, headache, chest pain, dyspnea, cough, abdominal pain, nausea, vomiting or abnormal bleeding. She does have occasional back pain. Past Medical History:  Diagnosis Date  . Anemia   . Cellulitis    LOWER EXTREMITIES  . Chronic diarrhea    a/w nausea - felt related to IBS  . Deaf    left side only  . Diastolic CHF ([place])   . Disc degeneration, lumbar   . GERD (gastroesophageal reflux disease)   . Hyperlipidemia    hx rhabdo on statins  . Hypertension   . Neuropathy ([facility])    feet, toes and fingers  . On home oxygen therapy    uses oxygen 2 liters min per [place] at night and prn during day  . OSA (obstructive sleep apnea)    05/2009 sleep study - refuses CPAP  . Osteoarthritis   . RLS (restless legs syndrome)   . Shortness of breath    chronic  . Stasis dermatitis   . Type II or unspecified type diabetes mellitus without mention of complication, not stated as uncontrolled    insulin dep   Past Surgical History:  Procedure Laterality Date  . BILIARY STENT PLACEMENT N/A 05/06/2016   Procedure: BILIARY STENT PLACEMENT;  Surgeon: Gatha Mayer, MD;  Location: [facility];  Service: Endoscopy;   Laterality: N/A;  . CHOLECYSTECTOMY  1997  . COLONOSCOPY N/A 12/03/2012   Procedure: COLONOSCOPY;  Surgeon: Lafayette Dragon, MD;  Location: [facility];  Service: Endoscopy;  Laterality: N/A;  . ENDOSCOPIC RETROGRADE CHOLANGIOPANCREATOGRAPHY (ERCP) WITH PROPOFOL N/A 05/06/2016   Procedure: ENDOSCOPIC RETROGRADE CHOLANGIOPANCREATOGRAPHY (ERCP) WITH PROPOFOL;  Surgeon: Gatha Mayer, MD;  Location: [facility];  Service: Endoscopy;  Laterality: N/A;  . ERCP N/A 03/18/2016   Procedure: ENDOSCOPIC RETROGRADE CHOLANGIOPANCREATOGRAPHY (ERCP);  Surgeon: Irene Shipper, MD;  Location: [facility];  Service: Endoscopy;  Laterality: N/A;  . EUS N/A 04/28/2016   Procedure: UPPER ENDOSCOPIC ULTRASOUND (EUS) LINEAR;  Surgeon: [name], MD;  Location: [facility];  Service: Endoscopy;  Laterality: N/A;  . IR GENERIC HISTORICAL  05/07/2016   IR BILIARY DRAIN PLACEMENT WITH CHOLANGIOGRAM 05/07/2016 Jacqulynn Cadet, MD [facility]  . TONSILLECTOMY  1970  . TUBAL LIGATION  1980  . UMBILICAL HERNIA REPAIR  1995  . uterine polyp removal  2008      Allergies: Cymbalta [duloxetine hcl]; Statins; Sulfa antibiotics; Levemir [insulin detemir]; Morphine; and Zinc  Medications: Prior to Admission medications   Medication Sig Start Date End Date Taking? Authorizing Provider  aspirin EC 81 MG tablet Take 1 tablet (81 mg total) by mouth daily. 07/08/14  Yes Rowe Clack, MD  carvedilol (COREG) 25 MG tablet TAKE 1 TABLET BY MOUTH TWICE DAILY WITH FOOD 01/20/16  Yes Jolaine Artist, MD  Cholecalciferol (VITAMIN D3) 2000 units capsule Take  1 capsule (2,000 Units total) by mouth daily. 06/17/15  Yes Janith Lima, MD  citalopram (CELEXA) 20 MG tablet TAKE 1 TABLET (20 MG TOTAL) BY MOUTH DAILY. 01/27/16  Yes [name], MD  DULoxetine (CYMBALTA) 60 MG capsule Take 1 capsule (60 mg total) by mouth daily. 09/25/15  Yes [name], MD  ezetimibe (ZETIA) 10 MG tablet TAKE ONE TABLET BY MOUTH  DAILY 02/03/16  Yes [name], MD  feeding supplement, ENSURE ENLIVE, (ENSURE ENLIVE) LIQD Take 237 mLs by mouth 3 (three) times daily between meals. 05/13/16  Yes Patrecia Pour, MD  gabapentin (NEURONTIN) 300 MG capsule TAKE TWO CAPSULES BY MOUTH FOUR TIMES A DAY 01/19/16  Yes [name], MD  hydrALAZINE (APRESOLINE) 25 MG tablet TAKE 1 TABLET (25 MG TOTAL) BY MOUTH 3 (THREE) TIMES DAILY. 02/16/16  Yes Jolaine Artist, MD  HYDROcodone-acetaminophen (NORCO) 10-325 MG tablet Take 1 tablet by mouth every 6 (six) hours as needed. 05/13/16  Yes Patrecia Pour, MD  insulin aspart (NOVOLOG) 100 UNIT/ML injection Inject 0-20 Units into the skin 3 (three) times daily with meals. . CBG 70 - 120: 0 units CBG 121 - 150: 3 units CBG 151 - 200: 4 units CBG 201 - 250: 7 units CBG 251 - 300: 11 units CBG 301 - 350: 15 units CBG 351 - 400: 20 units 03/21/16  Yes Mariel Aloe, MD  insulin glargine (LANTUS) 100 UNIT/ML injection Inject 0.3 mLs (30 Units total) into the skin daily. 03/22/16  Yes Mariel Aloe, MD  lactulose (CHRONULAC) 10 GM/15ML solution Take 30 mLs (20 g total) by mouth daily. 05/14/16  Yes Patrecia Pour, MD  LYRICA 200 MG capsule TAKE ONE CAPSULE BY MOUTH TWICE A DAY 04/07/16  Yes [name], MD  metFORMIN (GLUCOPHAGE-XR) 500 MG 24 hr tablet Take 1,000 mg by mouth daily with breakfast.    Yes Historical Provider, MD  metolazone (ZAROXOLYN) 2.5 MG tablet Take 1 tablet (2.5 mg total) by mouth 2 (two) times a week. 12/17/15  Yes Kabria Friar, PA-C  rOPINIRole (REQUIP XL) 4 MG 24 hr tablet TAKE 1 TABLET (4 MG TOTAL) BY MOUTH AT BEDTIME. 02/23/16  Yes [name], MD  spironolactone (ALDACTONE) 25 MG tablet TAKE ONE TABLET EACH DAY 04/21/16  Yes Larey Dresser, MD  torsemide (DEMADEX) 100 MG tablet Take 1 tablet (100 mg total) by mouth daily. 03/21/16  Yes Mariel Aloe, MD  vitamin B-12 (CYANOCOBALAMIN) 500 MCG tablet Take 500 mcg by mouth daily. Reported  on 09/09/2015   Yes Historical Provider, MD     Vital Signs: BP (!) 145/52 (BP Location: Left Arm)   Pulse (!) 58   Temp 98.6 F (37 C) (Oral)   Resp 18   SpO2 97%   Physical Exam patient awake, alert. Chest clear to auscultation bilaterally anteriorly. Heart slightly bradycardic regular rhythm. Abdomen obese, soft, positive bowel sounds, intact right upper quadrant biliary drain, insertion site mildly tender to palpation, green bile and drain bag; lower extremities with 1-2+ pretibial edema bilaterally with venous stasis skin changes  Imaging: No results found.  Labs:  CBC:  Recent Labs  05/09/16 0419 05/10/16 0533 05/11/16 0719 05/19/16 0726  WBC 6.4 6.5 6.9 8.2  HGB 8.3* 8.4* 8.4* 10.9*  HCT 26.8* 27.0* 27.0* 33.9*  PLT 325 374 410* 405*    COAGS:  Recent Labs  03/15/16 1116 05/05/16 0023 05/19/16 0726  INR 0.94 0.97 0.97  APTT 30 32 34    BMP:  Recent Labs  05/11/16 0719 05/12/16 0423 05/13/16 0413 05/19/16 0726  NA 142 140 139 136  K 3.4* 3.3* 3.5 3.3*  CL 101 101 101 91*  CO2 32 30 29 30   GLUCOSE 131* 150* 179* 281*  BUN 7 5* <5* 22*  CALCIUM 8.6* 8.5* 8.7* 8.7*  CREATININE 0.70 0.61 0.61 1.31*  GFRNONAA >60 >60 >60 40*  GFRAA >60 >60 >60 47*    LIVER FUNCTION TESTS:  Recent Labs  05/11/16 0719 05/12/16 0423 05/13/16 0413 05/19/16 0726  BILITOT 3.2* 2.6* 2.4* 2.2*  AST 36 32 30 31  ALT 30 27 24 19   ALKPHOS 593* 595* 596* 458*  PROT 5.1* 5.5* 5.6* 7.5  ALBUMIN 1.6* 1.7* 1.7* 2.9*    Assessment and Plan: Pt with history of 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 She developed cholangitis shortly afterwards and had removal of clogged plastic biliary stent via ERCP on 3/16. A new stent was unable to be placed. She then underwent internal/external biliary drain placement by IR on 3/17. Bile cultures grew enterococcus and pantoea species. She presents today  for follow-up cholangiogram and attempted biliary stent placement. Details/risks of procedure, including but not limited to, internal bleeding, infection, injury to adjacent structures, inability to place stent discussed with patient with her understanding and consent.   Electronically Signed: D. Rowe Robert 05/19/2016, 8:38 AM   I spent a total of 20 minutes at the the patient's bedside AND on the patient's hospital floor or unit, greater than 50% of which was counseling/coordinating care for cholangiogram with attempted biliary stent placement

## 2016-05-20 ENCOUNTER — Encounter (HOSPITAL_COMMUNITY): Payer: Self-pay

## 2016-05-20 DIAGNOSIS — G894 Chronic pain syndrome: Secondary | ICD-10-CM | POA: Diagnosis not present

## 2016-05-20 DIAGNOSIS — Z823 Family history of stroke: Secondary | ICD-10-CM | POA: Diagnosis not present

## 2016-05-20 DIAGNOSIS — F339 Major depressive disorder, recurrent, unspecified: Secondary | ICD-10-CM | POA: Diagnosis not present

## 2016-05-20 DIAGNOSIS — K580 Irritable bowel syndrome with diarrhea: Secondary | ICD-10-CM | POA: Diagnosis present

## 2016-05-20 DIAGNOSIS — N390 Urinary tract infection, site not specified: Secondary | ICD-10-CM | POA: Diagnosis not present

## 2016-05-20 DIAGNOSIS — F322 Major depressive disorder, single episode, severe without psychotic features: Secondary | ICD-10-CM | POA: Diagnosis present

## 2016-05-20 DIAGNOSIS — H9192 Unspecified hearing loss, left ear: Secondary | ICD-10-CM | POA: Diagnosis present

## 2016-05-20 DIAGNOSIS — I872 Venous insufficiency (chronic) (peripheral): Secondary | ICD-10-CM | POA: Diagnosis present

## 2016-05-20 DIAGNOSIS — Z9689 Presence of other specified functional implants: Secondary | ICD-10-CM | POA: Diagnosis not present

## 2016-05-20 DIAGNOSIS — Z885 Allergy status to narcotic agent status: Secondary | ICD-10-CM | POA: Diagnosis not present

## 2016-05-20 DIAGNOSIS — R011 Cardiac murmur, unspecified: Secondary | ICD-10-CM | POA: Diagnosis not present

## 2016-05-20 DIAGNOSIS — Z9981 Dependence on supplemental oxygen: Secondary | ICD-10-CM | POA: Diagnosis not present

## 2016-05-20 DIAGNOSIS — Z5181 Encounter for therapeutic drug level monitoring: Secondary | ICD-10-CM | POA: Diagnosis not present

## 2016-05-20 DIAGNOSIS — A419 Sepsis, unspecified organism: Secondary | ICD-10-CM | POA: Diagnosis not present

## 2016-05-20 DIAGNOSIS — I5032 Chronic diastolic (congestive) heart failure: Secondary | ICD-10-CM | POA: Diagnosis not present

## 2016-05-20 DIAGNOSIS — C250 Malignant neoplasm of head of pancreas: Secondary | ICD-10-CM | POA: Diagnosis not present

## 2016-05-20 DIAGNOSIS — Z7982 Long term (current) use of aspirin: Secondary | ICD-10-CM | POA: Diagnosis not present

## 2016-05-20 DIAGNOSIS — I110 Hypertensive heart disease with heart failure: Secondary | ICD-10-CM | POA: Diagnosis present

## 2016-05-20 DIAGNOSIS — I10 Essential (primary) hypertension: Secondary | ICD-10-CM | POA: Diagnosis not present

## 2016-05-20 DIAGNOSIS — R41841 Cognitive communication deficit: Secondary | ICD-10-CM | POA: Diagnosis not present

## 2016-05-20 DIAGNOSIS — G4730 Sleep apnea, unspecified: Secondary | ICD-10-CM | POA: Diagnosis not present

## 2016-05-20 DIAGNOSIS — E1142 Type 2 diabetes mellitus with diabetic polyneuropathy: Secondary | ICD-10-CM

## 2016-05-20 DIAGNOSIS — R278 Other lack of coordination: Secondary | ICD-10-CM | POA: Diagnosis not present

## 2016-05-20 DIAGNOSIS — R5082 Postprocedural fever: Secondary | ICD-10-CM | POA: Diagnosis not present

## 2016-05-20 DIAGNOSIS — K830 Cholangitis: Secondary | ICD-10-CM | POA: Diagnosis not present

## 2016-05-20 DIAGNOSIS — B377 Candidal sepsis: Secondary | ICD-10-CM | POA: Diagnosis present

## 2016-05-20 DIAGNOSIS — B3749 Other urogenital candidiasis: Secondary | ICD-10-CM | POA: Diagnosis not present

## 2016-05-20 DIAGNOSIS — M6281 Muscle weakness (generalized): Secondary | ICD-10-CM | POA: Diagnosis not present

## 2016-05-20 DIAGNOSIS — G4733 Obstructive sleep apnea (adult) (pediatric): Secondary | ICD-10-CM | POA: Diagnosis present

## 2016-05-20 DIAGNOSIS — K831 Obstruction of bile duct: Secondary | ICD-10-CM | POA: Diagnosis not present

## 2016-05-20 DIAGNOSIS — Z794 Long term (current) use of insulin: Secondary | ICD-10-CM | POA: Diagnosis not present

## 2016-05-20 DIAGNOSIS — K219 Gastro-esophageal reflux disease without esophagitis: Secondary | ICD-10-CM | POA: Diagnosis present

## 2016-05-20 DIAGNOSIS — C801 Malignant (primary) neoplasm, unspecified: Secondary | ICD-10-CM | POA: Diagnosis not present

## 2016-05-20 DIAGNOSIS — Z882 Allergy status to sulfonamides status: Secondary | ICD-10-CM | POA: Diagnosis not present

## 2016-05-20 DIAGNOSIS — G2581 Restless legs syndrome: Secondary | ICD-10-CM | POA: Diagnosis present

## 2016-05-20 DIAGNOSIS — Z8249 Family history of ischemic heart disease and other diseases of the circulatory system: Secondary | ICD-10-CM | POA: Diagnosis not present

## 2016-05-20 DIAGNOSIS — Z800 Family history of malignant neoplasm of digestive organs: Secondary | ICD-10-CM | POA: Diagnosis not present

## 2016-05-20 DIAGNOSIS — K838 Other specified diseases of biliary tract: Secondary | ICD-10-CM | POA: Diagnosis not present

## 2016-05-20 DIAGNOSIS — R509 Fever, unspecified: Secondary | ICD-10-CM | POA: Diagnosis present

## 2016-05-20 DIAGNOSIS — D649 Anemia, unspecified: Secondary | ICD-10-CM | POA: Diagnosis present

## 2016-05-20 DIAGNOSIS — R100 Acute abdomen: Secondary | ICD-10-CM | POA: Diagnosis not present

## 2016-05-20 DIAGNOSIS — Z4803 Encounter for change or removal of drains: Secondary | ICD-10-CM | POA: Diagnosis not present

## 2016-05-20 DIAGNOSIS — E785 Hyperlipidemia, unspecified: Secondary | ICD-10-CM | POA: Diagnosis not present

## 2016-05-20 DIAGNOSIS — T814XXA Infection following a procedure, initial encounter: Secondary | ICD-10-CM | POA: Diagnosis present

## 2016-05-20 DIAGNOSIS — Z801 Family history of malignant neoplasm of trachea, bronchus and lung: Secondary | ICD-10-CM | POA: Diagnosis not present

## 2016-05-20 DIAGNOSIS — Z888 Allergy status to other drugs, medicaments and biological substances status: Secondary | ICD-10-CM | POA: Diagnosis not present

## 2016-05-20 DIAGNOSIS — C259 Malignant neoplasm of pancreas, unspecified: Secondary | ICD-10-CM | POA: Diagnosis not present

## 2016-05-20 LAB — CBC WITH DIFFERENTIAL/PLATELET
BASOS ABS: 0 10*3/uL (ref 0.0–0.1)
BASOS PCT: 0 %
Eosinophils Absolute: 0.1 10*3/uL (ref 0.0–0.7)
Eosinophils Relative: 1 %
HEMATOCRIT: 32.6 % — AB (ref 36.0–46.0)
HEMOGLOBIN: 10.5 g/dL — AB (ref 12.0–15.0)
LYMPHS PCT: 12 %
Lymphs Abs: 1.7 10*3/uL (ref 0.7–4.0)
MCH: 28.4 pg (ref 26.0–34.0)
MCHC: 32.2 g/dL (ref 30.0–36.0)
MCV: 88.1 fL (ref 78.0–100.0)
MONOS PCT: 7 %
Monocytes Absolute: 1 10*3/uL (ref 0.1–1.0)
NEUTROS ABS: 11.2 10*3/uL — AB (ref 1.7–7.7)
NEUTROS PCT: 80 %
Platelets: 360 10*3/uL (ref 150–400)
RBC: 3.7 MIL/uL — ABNORMAL LOW (ref 3.87–5.11)
RDW: 15.8 % — ABNORMAL HIGH (ref 11.5–15.5)
WBC: 14.1 10*3/uL — ABNORMAL HIGH (ref 4.0–10.5)

## 2016-05-20 LAB — URINALYSIS, ROUTINE W REFLEX MICROSCOPIC
Bilirubin Urine: NEGATIVE
GLUCOSE, UA: NEGATIVE mg/dL
KETONES UR: NEGATIVE mg/dL
Nitrite: NEGATIVE
PROTEIN: NEGATIVE mg/dL
Specific Gravity, Urine: 1.013 (ref 1.005–1.030)
pH: 5 (ref 5.0–8.0)

## 2016-05-20 LAB — COMPREHENSIVE METABOLIC PANEL
ALBUMIN: 2.4 g/dL — AB (ref 3.5–5.0)
ALK PHOS: 384 U/L — AB (ref 38–126)
ALT: 19 U/L (ref 14–54)
AST: 30 U/L (ref 15–41)
Anion gap: 11 (ref 5–15)
BILIRUBIN TOTAL: 1.9 mg/dL — AB (ref 0.3–1.2)
BUN: 23 mg/dL — AB (ref 6–20)
CALCIUM: 7.9 mg/dL — AB (ref 8.9–10.3)
CO2: 29 mmol/L (ref 22–32)
CREATININE: 1.05 mg/dL — AB (ref 0.44–1.00)
Chloride: 98 mmol/L — ABNORMAL LOW (ref 101–111)
GFR calc Af Amer: >60 mL/min (ref >60)
GFR calc non Af Amer: 53 mL/min — ABNORMAL LOW (ref >60)
GLUCOSE: 232 mg/dL — AB (ref 65–99)
Potassium: 3.7 mmol/L (ref 3.5–5.1)
Sodium: 138 mmol/L (ref 135–145)
TOTAL PROTEIN: 6.6 g/dL (ref 6.5–8.1)

## 2016-05-20 LAB — GLUCOSE, CAPILLARY
Glucose-Capillary: 219 mg/dL — ABNORMAL HIGH (ref 65–99)
Glucose-Capillary: 234 mg/dL — ABNORMAL HIGH (ref 65–99)
Glucose-Capillary: 250 mg/dL — ABNORMAL HIGH (ref 65–99)

## 2016-05-20 LAB — PROTIME-INR
INR: 1.17
PROTHROMBIN TIME: 15 s (ref 11.4–15.2)

## 2016-05-20 MED ORDER — SODIUM CHLORIDE 0.9 % IV SOLN
INTRAVENOUS | Status: DC
  Administered 2016-05-20 – 2016-05-21 (×2): via INTRAVENOUS

## 2016-05-20 MED ORDER — IBUPROFEN 200 MG PO TABS
400.0000 mg | ORAL_TABLET | Freq: Four times a day (QID) | ORAL | Status: DC | PRN
  Administered 2016-05-20 – 2016-05-24 (×4): 400 mg via ORAL
  Filled 2016-05-20 (×4): qty 2

## 2016-05-20 NOTE — Progress Notes (Signed)
Referring Physician(s): Feng,Y/Gessner,C  Supervising Physician: Markus Daft  Patient Status:  [facility] - In-pt  Chief Complaint:  Pancreatic cancer with biliary obstruction  Subjective: Patient feeling better today. Just finished lunch and had bowel movement. Family in room. Denies nausea or vomiting. Has had some soreness at drain insertion site. Had transient bacteremia yesterday post biliary stent placement.   Allergies: Cymbalta [duloxetine hcl]; Statins; Sulfa antibiotics; Levemir [insulin detemir]; Morphine; and Zinc  Medications: Prior to Admission medications   Medication Sig Start Date End Date Taking? Authorizing Provider  aspirin EC 81 MG tablet Take 1 tablet (81 mg total) by mouth daily. 07/08/14  Yes Rowe Clack, MD  carvedilol (COREG) 25 MG tablet TAKE 1 TABLET BY MOUTH TWICE DAILY WITH FOOD 01/20/16  Yes Jolaine Artist, MD  Cholecalciferol (VITAMIN D3) 2000 units capsule Take 1 capsule (2,000 Units total) by mouth daily. 06/17/15  Yes Janith Lima, MD  citalopram (CELEXA) 20 MG tablet TAKE 1 TABLET (20 MG TOTAL) BY MOUTH DAILY. 01/27/16  Yes [name], MD  DULoxetine (CYMBALTA) 60 MG capsule Take 1 capsule (60 mg total) by mouth daily. 09/25/15  Yes [name], MD  ezetimibe (ZETIA) 10 MG tablet TAKE ONE TABLET BY MOUTH DAILY 02/03/16  Yes [name], MD  gabapentin (NEURONTIN) 300 MG capsule TAKE TWO CAPSULES BY MOUTH FOUR TIMES A DAY 01/19/16  Yes [name], MD  hydrALAZINE (APRESOLINE) 25 MG tablet TAKE 1 TABLET (25 MG TOTAL) BY MOUTH 3 (THREE) TIMES DAILY. 02/16/16  Yes Jolaine Artist, MD  HYDROcodone-acetaminophen (NORCO) 10-325 MG tablet Take 1 tablet by mouth every 6 (six) hours as needed. Patient taking differently: Take 1 tablet by mouth every 6 (six) hours as needed (pain).  05/13/16  Yes Patrecia Pour, MD  insulin glargine (LANTUS) 100 UNIT/ML injection Inject 0.3 mLs (30 Units total) into the skin daily.  03/22/16  Yes Mariel Aloe, MD  lactulose (CHRONULAC) 10 GM/15ML solution Take 30 mLs (20 g total) by mouth daily. 05/14/16  Yes Patrecia Pour, MD  LYRICA 200 MG capsule TAKE ONE CAPSULE BY MOUTH TWICE A DAY 04/07/16  Yes [name], MD  metFORMIN (GLUCOPHAGE) 500 MG tablet Take 500 mg by mouth daily with breakfast.   Yes Historical Provider, MD  metolazone (ZAROXOLYN) 2.5 MG tablet Take 1 tablet (2.5 mg total) by mouth 2 (two) times a week. 12/17/15  Yes Satira Mccallum Tillery, PA-C  NON FORMULARY Take 120 mLs by mouth 3 (three) times daily. Sugar-free "MedPass" for supplement.   Yes Historical Provider, MD  rOPINIRole (REQUIP XL) 4 MG 24 hr tablet TAKE 1 TABLET (4 MG TOTAL) BY MOUTH AT BEDTIME. 02/23/16  Yes [name], MD  spironolactone (ALDACTONE) 25 MG tablet TAKE ONE TABLET EACH DAY 04/21/16  Yes Larey Dresser, MD  torsemide (DEMADEX) 100 MG tablet Take 1 tablet (100 mg total) by mouth daily. 03/21/16  Yes Mariel Aloe, MD  vitamin B-12 (CYANOCOBALAMIN) 500 MCG tablet Take 500 mcg by mouth daily. Reported on 09/09/2015   Yes Historical Provider, MD  feeding supplement, ENSURE ENLIVE, (ENSURE ENLIVE) LIQD Take 237 mLs by mouth 3 (three) times daily between meals. 05/13/16   Patrecia Pour, MD  insulin aspart (NOVOLOG) 100 UNIT/ML injection Inject 0-20 Units into the skin 3 (three) times daily with meals. . CBG 70 - 120: 0 units CBG 121 - 150: 3 units CBG 151 - 200: 4 units CBG 201 -  250: 7 units CBG 251 - 300: 11 units CBG 301 - 350: 15 units CBG 351 - 400: 20 units 03/21/16   Mariel Aloe, MD     Vital Signs: BP (!) 129/44 (BP Location: Right Arm)   Pulse 66   Temp 98.8 F (37.1 C) (Oral)   Resp 16   Ht 5\' 2"  (1.575 m)   Wt 224 lb 13.9 oz (102 kg)   SpO2 93%   BMI 41.13 kg/m   Physical Exam awake, alert. Answers questions appropriately; biliary drain intact, output 110 mL; no hemobilia; insertion site mildly tender; drain flushed with 10 mL sterile normal saline  without difficulty  Imaging: Ir Biliary Stent(s) Existing Access Inc Dilation Cath Exchange  Result Date: 05/19/2016 INDICATION: 71 year old female with pancreatic cancer and malignant obstructed jaundice. A percutaneous transhepatic biliary drain was placed on 05/07/2016 via a right posterior duct approach. She is now doing better and presents for placement of an internal stent. EXAM: 1. Cholangiogram through existing access 2. Placement of a 10 x 60 mm wall flex self expanding covered metallic stent 3. Biliary drain exchange MEDICATIONS: 3.375 g Zosyn; The antibiotic was administered within an appropriate time frame prior to the initiation of the procedure. ANESTHESIA/SEDATION: Moderate (conscious) sedation was employed during this procedure. A total of Versed 3.5 mg and Fentanyl 100 mcg was administered intravenously. Moderate Sedation Time: 14 minutes. The patient's level of consciousness and vital signs were monitored continuously by radiology nursing throughout the procedure under my direct supervision. FLUOROSCOPY TIME:  Fluoroscopy Time: 0 minutes 5 seconds (48 mGy). COMPLICATIONS: SIR LEVEL B - Normal therapy, includes overnight admission for observation. Approximately 2 hours after discharge the patient became febrile with rigors while in her oncologist's office. She was admitted overnight for observation, IV fluids and additional antibiotics. PROCEDURE: Informed written consent was obtained from the patient after a thorough discussion of the procedural risks, benefits and alternatives. All questions were addressed. Maximal Sterile Barrier Technique was utilized including caps, mask, sterile gowns, sterile gloves, sterile drape, hand hygiene and skin antiseptic. A timeout was performed prior to the initiation of the procedure. The existing 10 [place] biliary tube was cut and removed over an Amplatz wire. An 8 [place] vascular sheath was advanced over the wire and into the duodenum. A pull-back  cholangiogram was performed. Persisting complete long segment occlusion of the common bile duct. There is some persistent intra and extrahepatic biliary ductal dilatation. No obvious debris or stones. While keeping the Amplatz wire within the duodenum as a safety wire, an angled catheter was advanced through the 8 French sheath coaxially and positioned in the duodenum. The tip of the Bentson wire was then advanced through the [place] the catheter in used to measure the length of the common bile duct stenosis which measures approximately 5 cm. Therefore, we will proceed with placement of a 10 x 60 mm wall flex self expanding covered metal biliary stent. The stent was selected in advanced over the Amplatz wire. The stent was then successfully deployed. There was excellent opening of the proximal and distal landing zone and partial opening in the region of the stricture. There was immediate evacuation of contrast an bile through the stent. Given the patient's past history of colon check his, the decision was made to leave a internal biliary drain into a we have confirmed that the stent drainage is sufficient. The Amplatz wire was withdrawn through the stent and coiled in the hepatic biliary confluence. The 8 French sheath was  removed. A new 10.2 [place] cook all-purpose drainage catheter was advanced over the wire and coiled in the region of the biliary confluence. The catheter was gently flushed and capped. The patient tolerated the procedure well. IMPRESSION: 1. 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 2. Successful placement of a 10 x 60 wall flex self expanding covered metallic stent. 3. A 10 French internal biliary drain was left in place at the hepatic confluence proximal to the stent. This tube will be left capped for a physiologic trial to insure that the metal stent is sufficient. PLAN: 1. Patient developed fever and rigors several hours following the procedure requiring admission  for observation. This is likely secondary to transient bacteremia related to colonization of the bile and today's manipulation. 2. Biliary drain should be left to bag drainage overnight. If the patient is feeling well and ready for discharge tomorrow, the drain can be capped prior to discharge. 3. Patient to return Interventional Radiology in 4 weeks for cholangiogram and possible drain removal. A complete metabolic panel will be drawn at that time to assess bilirubin. 4. The family understands that if she develops symptoms of recurrent biliary obstruction or jaundice during the four-week physiologic trial they should immediately connect the drainage catheter back to gravity bag drainage. A was noted the patient is currently in a rehab facility. Therefore, these instructions were written down on a progress note for the family to give to her caregivers. Signed, Criselda Peaches, MD [facility] Radiology Electronically Signed   By: Jacqulynn Cadet M.D.   On: 05/19/2016 16:45    Labs:  CBC:  Recent Labs  05/11/16 0719 05/19/16 0726 05/19/16 1906 05/20/16 0550  WBC 6.9 8.2 13.9* 14.1*  HGB 8.4* 10.9* 11.5* 10.5*  HCT 27.0* 33.9* 35.0* 32.6*  PLT 410* 405* 385 360    COAGS:  Recent Labs  03/15/16 1116 05/05/16 0023 05/19/16 0726 05/20/16 0550  INR 0.94 0.97 0.97 1.17  APTT 30 32 34  --     BMP:  Recent Labs  05/13/16 0413 05/19/16 0726 05/19/16 1906 05/20/16 0550  NA 139 136 136 138  K 3.5 3.3* 3.4* 3.7  CL 101 91* 93* 98*  CO2 29 30 31 29   GLUCOSE 179* 281* 246* 232*  BUN <5* 22* 25* 23*  CALCIUM 8.7* 8.7* 8.2* 7.9*  CREATININE 0.61 1.31* 1.13* 1.05*  GFRNONAA >60 40* 48* 53*  GFRAA >60 47* 56* >60    LIVER FUNCTION TESTS:  Recent Labs  05/13/16 0413 05/19/16 0726 05/19/16 1906 05/20/16 0550  BILITOT 2.4* 2.2* 2.0* 1.9*  AST 30 31 37 30  ALT 24 19 21 19   ALKPHOS 596* 458* 436* 384*  PROT 5.6* 7.5 7.0 6.6    ALBUMIN 1.7* 2.9* 2.7* 2.4*    Assessment and Plan: Patient with history of pancreatic cancer with biliary obstruction, status post biliary drain exchange and placement of a wall flex self expanding covered metallic biliary stent on 05/19/16; had transient bacteremia post procedure and was admitted by [facility] for further observation. Currently with temp 99.5, WBC 14.1 (13.9), hemoglobin 10.5, creatinine 1.05, K nl; T bili 1.9(2.0); bile sent for repeat culture and sensitivity. For now continue drain to gravity bag and flush every 8 hours with 5 mL sterile normal saline. Await bile cx results, monitor electrolytes, CBC and LFTs. Prior to anticipated discharge date and if t bili continues to decline/ WBC normalizes, can reattempt capping of biliary drain. She will  be scheduled for cholangiogram and possible external drain removal in 4 weeks if stable. Other plans as per IM/ONC.   Electronically Signed: D. Rowe Robert 05/20/2016, 2:26 PM   I spent a total of 20 minutes at the the patient's bedside AND on the patient's hospital floor or unit, greater than 50% of which was counseling/coordinating care for biliary drain    Patient ID: Melissa Myers, female   DOB: 01/29/1946, 71 y.o.   MRN: [id number]

## 2016-05-20 NOTE — Progress Notes (Signed)
At change of shift, MD wrote orders for telemetry monitoring.  Patient condition unchanged from day shift. She still appeared lethargic and confused/disoriented. Stat NS Bolus was hung on patient, report was given to 4th floor RN, and patient was transferred to 4th floor around 2030 pm.Abri Vacca, Amparo [place]

## 2016-05-20 NOTE — Progress Notes (Signed)
Initial Nutrition Assessment  DOCUMENTATION CODES:   Morbid obesity  INTERVENTION:  - Continue Ensure Enlive TID, each supplement provides 350 kcal and 20 grams of protein - Continue to encourage PO intakes of meals and supplements. - RD will continue to monitor for additional needs.  NUTRITION DIAGNOSIS:   Increased nutrient needs related to catabolic illness, cancer and cancer related treatments as evidenced by estimated needs.  GOAL:   Patient will meet greater than or equal to 90% of their needs  MONITOR:   PO intake, Supplement acceptance, Weight trends, Labs  REASON FOR ASSESSMENT:   Malnutrition Screening Tool  ASSESSMENT:   71 y.o. female with medical history significant of anemia, lower extremity cellulitis, IBS with chronic diarrhea, diastolic CHF, diabetic peripheral neuropathy, GERD, hyperlipidemia, hypertension, obstructive sleep apnea, restless leg syndrome, osteoarthritis, status is dermatitis, type 2 diabetes, pancreatic cancer who is being referred by the [facility] for treatment of fever after undergoing percutaneous transhepatic biliary drainage this morning. The patient received IV fluids and Zosyn prior to arrival. However, once on the floor the patient had a temperature of 103.27F and was delirious. She subsequently improved with IV fluids, Toradol 15 mg IVP, vancomycin and Zosyn.   Pt seen for MST. BMI indicates morbid obesity. No intakes documented since admission. Spoke with pt with daughter and husband at bedside. Pt was very drowsy and deferred questions/answers to her daughter. Pt did not eat breakfast this AM d/t not liking the foods provided. Daughter states that she feels that pt ate well for lunch; she did not eat all of meal but took several bites each of sweet potato, chicken, pinto beans, the tomato and cucumber and cheese from the top of salad.   PTA pt was at rehab at [facility]. Daughter feels that pt was getting stronger but that facility  was not providing pt with DM diet as she needed. A previous provider had informed pt and family that pt should follow a low carb, high protein diet in preparation for possible surgery 2/2 pancreatic cancer. At [facility] pt was eating very well d/t liking the food served. Daughter reports pt was diagnosed with pancreatic cancer in January 2018 and for several months prior to that and shortly after that time pt's appetite was very poor and intakes were decreased but over the past 2 months has begun to improve. Daughter does state that pt is a picky eater.   At [facility] pt was not receiving oral nutrition supplements. She had strawberry Ensure today which she enjoyed. Daughter is interested in purchasing these after d/c, especially for when appetite is poor. Provided her with coupons for Ensure, which has already been ordered TID here.   Pt with hx of pancreatic adenocarcinoma with biliary stricture with drain placement on 05/07/16. Dr. Ernestina Penna note from yesterday PM states probable ability for tumor resection and that radiation is the best option for pt at this time as she may not be an appropriate candidate for chemo. Plan to discuss surgery following radiation treatment.   Physical assessment shows no muscle or fat wasting at this time; moderate edema to BLE. Current weight is -28 lbs from 1 week ago; will monitor. Weight had previously been trending 233-255 lbs from 10/27/15-05/13/16.  Medications reviewed; sliding scale Novolog, 30 units Lantus/day, 20 g lactulose/day, 4 g IV Mg sulfate x1 run yesterday. Labs reviewed; CBGs: 219-250 mg/dL today, Cl: 98 mmol/L, BUN: 23 mg/dL, creatinine: 1.05 mg/dL, Ca: 7.9 mg/dL, Alk Phos elevated, GFR: 53 mL/min.   IVF: NS-20  mEq KCl @ 75 mL/hr.    Diet Order:  Diet heart healthy/carb modified Room service appropriate? Yes; Fluid consistency: Thin  Skin:  Reviewed, no issues  Last BM:  PTA/unknown  Height:   Ht Readings from Last 1 Encounters:  05/19/16  5' 2"  (1.575 m)    Weight:   Wt Readings from Last 1 Encounters:  05/19/16 224 lb 13.9 oz (102 kg)    Ideal Body Weight:  50 kg  BMI:  Body mass index is 41.13 kg/m.  Estimated Nutritional Needs:   Kcal:  1735-1940 (17-19 kcal/kg)  Protein:  102-122 grams (1-1.2 grams/kg)  Fluid:  1.8 L/day  EDUCATION NEEDS:   No education needs identified at this time    Jarome Matin, MS, RD, LDN, CNSC Inpatient Clinical Dietitian Pager # [phone] After hours/weekend pager # [phone]

## 2016-05-20 NOTE — Progress Notes (Signed)
PROGRESS NOTE    Melissa Myers  IOM:[id number] DOB: 1945/04/01 DOA: 05/19/2016 PCP: [name], MD   Brief Narrative:  71 yo female with pancreatic cancer with biliary obstruction, presented with fever and hypotension after exchange of percutaneous biliary drain. Suspected transient bacteremia. Started on broad spectrum antibiotics and IV fluids.    Assessment & Plan:   Principal Problem:   Fever Active Problems:   Dyslipidemia   RESTLESS LEGS SYNDROME   Essential hypertension   Chronic diastolic heart failure (HCC)   MDD (major depressive disorder), single episode, severe (HCC)   Diabetic peripheral neuropathy (HCC)   Hyperbilirubinemia   Pancreatic mass   SIRS (systemic inflammatory response syndrome) ([place])   1. Sepsis related to bilary drain exchange, transient bacteremia. Will continue antibiotic therapy with Zosyn, will hold on Vancomycin, will follow on cell count, temperature curve and cell count. Decrease rate of IV fluids to avoid volume overload. WBC at 14 from 13,9. Biliary fluid culture 05/07/2016 enterococcus.   2. Hyperbilirubinemia. Drain successfully exchanged, will continue to follow liver function testing. No significant pain. Bilirubins trending down.   3. Pancreatic cancer. Patient will return to SNF, cancer therapy on hold due to patient's poor performance status.   4. Hypertension. Will continue to hold on antihypertensive agents, continue gentle hydration with saline.    5. Chronic and stable diastolic heart failure. No signs of volume overload, will continue blood pressure monitoring and gentle IV fluids. Last echocardiogram in the system 10/17 with preserved left ventricular function, personally reviewed old records.   6. Urine tract infection. UA positive, will continue zosyn for antibiotic therapy. Urine culture from 05/05/16, klebsiella.   7. T2DM. Will continue glucose cover and monitoring with iss and basal insulin levimir. Capillary  glucose [phone].   DVT prophylaxis: enoxaparin  Code Status: full  Family Communication: I spoke with patient's family at the bedside and all questions were addressed.  Disposition Plan: home   Consultants:   Interventional radiology    Procedures:      Antimicrobials:   Zosyn     Subjective: Patient feeling better, no nausea or vomiting. Positive right upper quadrant pain, no chest pain or dyspnea.   Objective: Vitals:   05/19/16 2230 05/19/16 2335 05/20/16 0456 05/20/16 1347  BP: (!) 140/45 134/64 (!) 138/51 (!) 129/44  Pulse: 73 72 68 66  Resp: (!) 22 20 20 16   Temp:   99.5 F (37.5 C) 98.8 F (37.1 C)  TempSrc:   Oral Oral  SpO2: 100% 100% 100% 93%  Weight:      Height:        Intake/Output Summary (Last 24 hours) at 05/20/16 1438 Last data filed at 05/20/16 0706  Gross per 24 hour  Intake          2029.58 ml  Output              950 ml  Net          1079.58 ml   Filed Weights   05/19/16 1750  Weight: 102 kg (224 lb 13.9 oz)    Examination:  General exam: deconditioned E ENT: mild pallor, no icterus, oral mucosa moist.  Respiratory system: Clear to auscultation. Respiratory effort normal. Mild decreased breath sounds at bases.  Cardiovascular system: S1 & S2 heard, RRR. No JVD, murmurs, rubs, gallops or clicks. No pedal edema. Gastrointestinal system: Abdomen distended and protuberant. Biliary drain in place. Abdomen soft and nontender. No organomegaly or masses felt. Normal bowel sounds  heard. Central nervous system: Alert and oriented. No focal neurological deficits. Extremities: Symmetric 5 x 5 power. Skin: No rashes, lesions or ulcers     Data Reviewed: I have personally reviewed following labs and imaging studies  CBC:  Recent Labs Lab 05/19/16 0726 05/19/16 1906 05/20/16 0550  WBC 8.2 13.9* 14.1*  NEUTROABS 5.2 11.3* 11.2*  HGB 10.9* 11.5* 10.5*  HCT 33.9* 35.0* 32.6*  MCV 85.6 87.1 88.1  PLT 405* 385 408   Basic  Metabolic Panel:  Recent Labs Lab 05/19/16 0726 05/19/16 1906 05/19/16 1920 05/20/16 0550  NA 136 136  --  138  K 3.3* 3.4*  --  3.7  CL 91* 93*  --  98*  CO2 30 31  --  29  GLUCOSE 281* 246*  --  232*  BUN 22* 25*  --  23*  CREATININE 1.31* 1.13*  --  1.05*  CALCIUM 8.7* 8.2*  --  7.9*  MG  --  1.2*  --   --   PHOS  --   --  3.7  --    GFR: Estimated Creatinine Clearance: 55.8 mL/min (A) (by C-G formula based on SCr of 1.05 mg/dL (H)). Liver Function Tests:  Recent Labs Lab 05/19/16 0726 05/19/16 1906 05/20/16 0550  AST 31 37 30  ALT 19 21 19   ALKPHOS 458* 436* 384*  BILITOT 2.2* 2.0* 1.9*  PROT 7.5 7.0 6.6  ALBUMIN 2.9* 2.7* 2.4*   No results for input(s): LIPASE, AMYLASE in the last 168 hours. No results for input(s): AMMONIA in the last 168 hours. Coagulation Profile:  Recent Labs Lab 05/19/16 0726 05/20/16 0550  INR 0.97 1.17   Cardiac Enzymes: No results for input(s): CKTOTAL, CKMB, CKMBINDEX, TROPONINI in the last 168 hours. BNP (last 3 results)  Recent Labs  11/04/15 0956  PROBNP 20.0   HbA1C: No results for input(s): HGBA1C in the last 72 hours. CBG:  Recent Labs Lab 05/19/16 0709 05/20/16 0016 05/20/16 0739 05/20/16 1205  GLUCAP 281* 219* 234* 250*   Lipid Profile: No results for input(s): CHOL, HDL, LDLCALC, TRIG, CHOLHDL, LDLDIRECT in the last 72 hours. Thyroid Function Tests: No results for input(s): TSH, T4TOTAL, FREET4, T3FREE, THYROIDAB in the last 72 hours. Anemia Panel: No results for input(s): VITAMINB12, FOLATE, FERRITIN, TIBC, IRON, RETICCTPCT in the last 72 hours. Sepsis Labs:  Recent Labs Lab 05/19/16 2002  LATICACIDVEN 1.8    No results found for this or any previous visit (from the past 240 hour(s)).       Radiology Studies: Ir Biliary Stent(s) Existing Access Inc Dilation Cath Exchange  Result Date: 05/19/2016 INDICATION: 71 year old female with pancreatic cancer and malignant obstructed jaundice. A  percutaneous transhepatic biliary drain was placed on 05/07/2016 via a right posterior duct approach. She is now doing better and presents for placement of an internal stent. EXAM: 1. Cholangiogram through existing access 2. Placement of a 10 x 60 mm wall flex self expanding covered metallic stent 3. Biliary drain exchange MEDICATIONS: 3.375 g Zosyn; The antibiotic was administered within an appropriate time frame prior to the initiation of the procedure. ANESTHESIA/SEDATION: Moderate (conscious) sedation was employed during this procedure. A total of Versed 3.5 mg and Fentanyl 100 mcg was administered intravenously. Moderate Sedation Time: 14 minutes. The patient's level of consciousness and vital signs were monitored continuously by radiology nursing throughout the procedure under my direct supervision. FLUOROSCOPY TIME:  Fluoroscopy Time: 0 minutes 5 seconds (48 mGy). COMPLICATIONS: SIR LEVEL B - Normal therapy, includes  overnight admission for observation. Approximately 2 hours after discharge the patient became febrile with rigors while in her oncologist's office. She was admitted overnight for observation, IV fluids and additional antibiotics. PROCEDURE: Informed written consent was obtained from the patient after a thorough discussion of the procedural risks, benefits and alternatives. All questions were addressed. Maximal Sterile Barrier Technique was utilized including caps, mask, sterile gowns, sterile gloves, sterile drape, hand hygiene and skin antiseptic. A timeout was performed prior to the initiation of the procedure. The existing 10 [place] biliary tube was cut and removed over an Amplatz wire. An 8 [place] vascular sheath was advanced over the wire and into the duodenum. A pull-back cholangiogram was performed. Persisting complete long segment occlusion of the common bile duct. There is some persistent intra and extrahepatic biliary ductal dilatation. No obvious debris or stones. While keeping the  Amplatz wire within the duodenum as a safety wire, an angled catheter was advanced through the 8 French sheath coaxially and positioned in the duodenum. The tip of the Bentson wire was then advanced through the Kumpe the catheter in used to measure the length of the common bile duct stenosis which measures approximately 5 cm. Therefore, we will proceed with placement of a 10 x 60 mm wall flex self expanding covered metal biliary stent. The stent was selected in advanced over the Amplatz wire. The stent was then successfully deployed. There was excellent opening of the proximal and distal landing zone and partial opening in the region of the stricture. There was immediate evacuation of contrast an bile through the stent. Given the patient's past history of colon check his, the decision was made to leave a internal biliary drain into a we have confirmed that the stent drainage is sufficient. The Amplatz wire was withdrawn through the stent and coiled in the hepatic biliary confluence. The 8 French sheath was removed. A new 10.2 [place] cook all-purpose drainage catheter was advanced over the wire and coiled in the region of the biliary confluence. The catheter was gently flushed and capped. The patient tolerated the procedure well. IMPRESSION: 1. 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 2. Successful placement of a 10 x 60 wall flex self expanding covered metallic stent. 3. A 10 French internal biliary drain was left in place at the hepatic confluence proximal to the stent. This tube will be left capped for a physiologic trial to insure that the metal stent is sufficient. PLAN: 1. Patient developed fever and rigors several hours following the procedure requiring admission for observation. This is likely secondary to transient bacteremia related to colonization of the bile and today's manipulation. 2. Biliary drain should be left to bag drainage overnight. If the patient is feeling well and  ready for discharge tomorrow, the drain can be capped prior to discharge. 3. Patient to return Interventional Radiology in 4 weeks for cholangiogram and possible drain removal. A complete metabolic panel will be drawn at that time to assess bilirubin. 4. The family understands that if she develops symptoms of recurrent biliary obstruction or jaundice during the four-week physiologic trial they should immediately connect the drainage catheter back to gravity bag drainage. A was noted the patient is currently in a rehab facility. Therefore, these instructions were written down on a progress note for the family to give to her caregivers. Signed, Criselda Peaches, MD [facility] Radiology Electronically Signed   By: Jacqulynn Cadet M.D.   On: 05/19/2016 16:45  Scheduled Meds: . carvedilol  25 mg Oral BID WC  . feeding supplement (ENSURE ENLIVE)  237 mL Oral TID BM  . heparin  5,000 Units Subcutaneous Q8H  . insulin aspart  0-9 Units Subcutaneous TID WC  . insulin glargine  30 Units Subcutaneous Daily  . lactulose  20 g Oral Daily  . piperacillin-tazobactam (ZOSYN)  IV  3.375 g Intravenous Q8H  . pregabalin  200 mg Oral BID  . rOPINIRole  2 mg Oral BID  . vancomycin  1,250 mg Intravenous Q24H   Continuous Infusions: . 0.9 % NaCl with KCl 20 mEq / L 125 mL/hr at 05/20/16 1133     LOS: 0 days       [name], MD [facility] Pager [phone]  If 7PM-7AM, please contact night-coverage [email or web address] Password [facility] 05/20/2016, 2:38 PM

## 2016-05-21 DIAGNOSIS — Z885 Allergy status to narcotic agent status: Secondary | ICD-10-CM

## 2016-05-21 DIAGNOSIS — B3749 Other urogenital candidiasis: Secondary | ICD-10-CM

## 2016-05-21 DIAGNOSIS — Z8249 Family history of ischemic heart disease and other diseases of the circulatory system: Secondary | ICD-10-CM

## 2016-05-21 DIAGNOSIS — Z823 Family history of stroke: Secondary | ICD-10-CM

## 2016-05-21 DIAGNOSIS — Z888 Allergy status to other drugs, medicaments and biological substances status: Secondary | ICD-10-CM

## 2016-05-21 DIAGNOSIS — C250 Malignant neoplasm of head of pancreas: Secondary | ICD-10-CM

## 2016-05-21 DIAGNOSIS — Z801 Family history of malignant neoplasm of trachea, bronchus and lung: Secondary | ICD-10-CM

## 2016-05-21 DIAGNOSIS — Z800 Family history of malignant neoplasm of digestive organs: Secondary | ICD-10-CM

## 2016-05-21 DIAGNOSIS — R011 Cardiac murmur, unspecified: Secondary | ICD-10-CM

## 2016-05-21 DIAGNOSIS — Z882 Allergy status to sulfonamides status: Secondary | ICD-10-CM

## 2016-05-21 DIAGNOSIS — Z9689 Presence of other specified functional implants: Secondary | ICD-10-CM

## 2016-05-21 DIAGNOSIS — C801 Malignant (primary) neoplasm, unspecified: Secondary | ICD-10-CM

## 2016-05-21 DIAGNOSIS — I5032 Chronic diastolic (congestive) heart failure: Secondary | ICD-10-CM

## 2016-05-21 LAB — GLUCOSE, CAPILLARY
GLUCOSE-CAPILLARY: 244 mg/dL — AB (ref 65–99)
Glucose-Capillary: 231 mg/dL — ABNORMAL HIGH (ref 65–99)
Glucose-Capillary: 333 mg/dL — ABNORMAL HIGH (ref 65–99)
Glucose-Capillary: 258 mg/dL — ABNORMAL HIGH (ref 65–99)

## 2016-05-21 LAB — CBC WITH DIFFERENTIAL/PLATELET
BASOS ABS: 0 10*3/uL (ref 0.0–0.1)
BASOS PCT: 0 %
Eosinophils Absolute: 0.2 10*3/uL (ref 0.0–0.7)
Eosinophils Relative: 2 %
HEMATOCRIT: 27.2 % — AB (ref 36.0–46.0)
HEMOGLOBIN: 8.7 g/dL — AB (ref 12.0–15.0)
Lymphocytes Relative: 17 %
Lymphs Abs: 1.8 10*3/uL (ref 0.7–4.0)
MCH: 28.4 pg (ref 26.0–34.0)
MCHC: 32 g/dL (ref 30.0–36.0)
MCV: 88.9 fL (ref 78.0–100.0)
MONO ABS: 1 10*3/uL (ref 0.1–1.0)
Monocytes Relative: 9 %
NEUTROS ABS: 7.6 10*3/uL (ref 1.7–7.7)
NEUTROS PCT: 72 %
Platelets: 297 10*3/uL (ref 150–400)
RBC: 3.06 MIL/uL — ABNORMAL LOW (ref 3.87–5.11)
RDW: 15.6 % — AB (ref 11.5–15.5)
WBC: 10.7 10*3/uL — ABNORMAL HIGH (ref 4.0–10.5)

## 2016-05-21 LAB — BASIC METABOLIC PANEL
Anion gap: 8 (ref 5–15)
BUN: 16 mg/dL (ref 6–20)
CALCIUM: 8.1 mg/dL — AB (ref 8.9–10.3)
CHLORIDE: 99 mmol/L — AB (ref 101–111)
CO2: 28 mmol/L (ref 22–32)
CREATININE: 0.76 mg/dL (ref 0.44–1.00)
GFR calc non Af Amer: >60 mL/min (ref >60)
Glucose, Bld: 247 mg/dL — ABNORMAL HIGH (ref 65–99)
Potassium: 3.2 mmol/L — ABNORMAL LOW (ref 3.5–5.1)
Sodium: 135 mmol/L (ref 135–145)

## 2016-05-21 MED ORDER — POTASSIUM CHLORIDE 20 MEQ/15ML (10%) PO SOLN
40.0000 meq | ORAL | Status: AC
  Administered 2016-05-21 (×2): 40 meq via ORAL
  Filled 2016-05-21 (×2): qty 30

## 2016-05-21 MED ORDER — FLUCONAZOLE IN SODIUM CHLORIDE 400-0.9 MG/200ML-% IV SOLN
400.0000 mg | Freq: Once | INTRAVENOUS | Status: AC
  Administered 2016-05-21: 400 mg via INTRAVENOUS
  Filled 2016-05-21: qty 200

## 2016-05-21 MED ORDER — FLUCONAZOLE IN SODIUM CHLORIDE 400-0.9 MG/200ML-% IV SOLN
400.0000 mg | INTRAVENOUS | Status: DC
  Administered 2016-05-21 – 2016-05-24 (×4): 400 mg via INTRAVENOUS
  Filled 2016-05-21 (×4): qty 200

## 2016-05-21 MED ORDER — FLUCONAZOLE 100 MG PO TABS
200.0000 mg | ORAL_TABLET | Freq: Every day | ORAL | Status: DC
  Administered 2016-05-21: 200 mg via ORAL
  Filled 2016-05-21: qty 2

## 2016-05-21 NOTE — Progress Notes (Signed)
Referring Physician(s): Dr Lurline Del  Supervising Physician: Arne Cleveland  Patient Status:  Melissa Myers - In-pt  Chief Complaint:  Panc Ca Biliary drain exchanged 3/29  Subjective:  Better daily Sore abdomen Eating better OP bile 110 cc yesterday; 250 cc in bag now   Allergies: Cymbalta [duloxetine hcl]; Statins; Sulfa antibiotics; Levemir [insulin detemir]; Morphine; and Zinc  Medications: Prior to Admission medications   Medication Sig Start Date End Date Taking? Authorizing Provider  aspirin EC 81 MG tablet Take 1 tablet (81 mg total) by mouth daily. 07/08/14  Yes Rowe Clack, MD  carvedilol (COREG) 25 MG tablet TAKE 1 TABLET BY MOUTH TWICE DAILY WITH FOOD 01/20/16  Yes Jolaine Artist, MD  Cholecalciferol (VITAMIN D3) 2000 units capsule Take 1 capsule (2,000 Units total) by mouth daily. 06/17/15  Yes Janith Lima, MD  citalopram (CELEXA) 20 MG tablet TAKE 1 TABLET (20 MG TOTAL) BY MOUTH DAILY. 01/27/16  Yes [name], MD  DULoxetine (CYMBALTA) 60 MG capsule Take 1 capsule (60 mg total) by mouth daily. 09/25/15  Yes [name], MD  ezetimibe (ZETIA) 10 MG tablet TAKE ONE TABLET BY MOUTH DAILY 02/03/16  Yes [name], MD  gabapentin (NEURONTIN) 300 MG capsule TAKE TWO CAPSULES BY MOUTH FOUR TIMES A DAY 01/19/16  Yes [name], MD  hydrALAZINE (APRESOLINE) 25 MG tablet TAKE 1 TABLET (25 MG TOTAL) BY MOUTH 3 (THREE) TIMES DAILY. 02/16/16  Yes Jolaine Artist, MD  HYDROcodone-acetaminophen (NORCO) 10-325 MG tablet Take 1 tablet by mouth every 6 (six) hours as needed. Patient taking differently: Take 1 tablet by mouth every 6 (six) hours as needed (pain).  05/13/16  Yes Patrecia Pour, MD  insulin glargine (LANTUS) 100 UNIT/ML injection Inject 0.3 mLs (30 Units total) into the skin daily. 03/22/16  Yes Mariel Aloe, MD  lactulose (CHRONULAC) 10 GM/15ML solution Take 30 mLs (20 g total) by mouth daily. 05/14/16  Yes Patrecia Pour, MD    LYRICA 200 MG capsule TAKE ONE CAPSULE BY MOUTH TWICE A DAY 04/07/16  Yes [name], MD  metFORMIN (GLUCOPHAGE) 500 MG tablet Take 500 mg by mouth daily with breakfast.   Yes Historical Provider, MD  metolazone (ZAROXOLYN) 2.5 MG tablet Take 1 tablet (2.5 mg total) by mouth 2 (two) times a week. 12/17/15  Yes Satira Mccallum Tillery, PA-C  NON FORMULARY Take 120 mLs by mouth 3 (three) times daily. Sugar-free "MedPass" for supplement.   Yes Historical Provider, MD  rOPINIRole (REQUIP XL) 4 MG 24 hr tablet TAKE 1 TABLET (4 MG TOTAL) BY MOUTH AT BEDTIME. 02/23/16  Yes [name], MD  spironolactone (ALDACTONE) 25 MG tablet TAKE ONE TABLET EACH DAY 04/21/16  Yes Larey Dresser, MD  torsemide (DEMADEX) 100 MG tablet Take 1 tablet (100 mg total) by mouth daily. 03/21/16  Yes Mariel Aloe, MD  vitamin B-12 (CYANOCOBALAMIN) 500 MCG tablet Take 500 mcg by mouth daily. Reported on 09/09/2015   Yes Historical Provider, MD  feeding supplement, ENSURE ENLIVE, (ENSURE ENLIVE) LIQD Take 237 mLs by mouth 3 (three) times daily between meals. 05/13/16   Patrecia Pour, MD  insulin aspart (NOVOLOG) 100 UNIT/ML injection Inject 0-20 Units into the skin 3 (three) times daily with meals. . CBG 70 - 120: 0 units CBG 121 - 150: 3 units CBG 151 - 200: 4 units CBG 201 - 250: 7 units CBG 251 - 300: 11 units CBG 301 -  350: 15 units CBG 351 - 400: 20 units 03/21/16   Mariel Aloe, MD     Vital Signs: BP (!) 121/54 (BP Location: Left Arm)   Pulse (!) 49   Temp 97.7 F (36.5 C) (Oral)   Resp 18   Ht 5\' 2"  (1.575 m)   Wt 224 lb 13.9 oz (102 kg)   SpO2 99%   BMI 41.13 kg/m   Physical Exam  Abdominal: Soft. Bowel sounds are normal. There is tenderness.  Neurological: She is alert.  Skin: Skin is warm and dry.  Skin site is with small amount of yellowish drainage Cleaned and redressed Drain intact Secure with stat lock OP 110 cc recorded yesterday; 250 cc in bag now: bile  Wbc 10.7 today afeb   Psychiatric: She has a normal mood and affect. Her behavior is normal.    Imaging: Ir Biliary Stent(s) Existing Access Inc Dilation Cath Exchange  Result Date: 05/19/2016 INDICATION: 71 year old female with pancreatic cancer and malignant obstructed jaundice. A percutaneous transhepatic biliary drain was placed on 05/07/2016 via a right posterior duct approach. She is now doing better and presents for placement of an internal stent. EXAM: 1. Cholangiogram through existing access 2. Placement of a 10 x 60 mm wall flex self expanding covered metallic stent 3. Biliary drain exchange MEDICATIONS: 3.375 g Zosyn; The antibiotic was administered within an appropriate time frame prior to the initiation of the procedure. ANESTHESIA/SEDATION: Moderate (conscious) sedation was employed during this procedure. A total of Versed 3.5 mg and Fentanyl 100 mcg was administered intravenously. Moderate Sedation Time: 14 minutes. The patient's level of consciousness and vital signs were monitored continuously by radiology nursing throughout the procedure under my direct supervision. FLUOROSCOPY TIME:  Fluoroscopy Time: 0 minutes 5 seconds (48 mGy). COMPLICATIONS: SIR LEVEL B - Normal therapy, includes overnight admission for observation. Approximately 2 hours after discharge the patient became febrile with rigors while in her oncologist's office. She was admitted overnight for observation, IV fluids and additional antibiotics. PROCEDURE: Informed written consent was obtained from the patient after a thorough discussion of the procedural risks, benefits and alternatives. All questions were addressed. Maximal Sterile Barrier Technique was utilized including caps, mask, sterile gowns, sterile gloves, sterile drape, hand hygiene and skin antiseptic. A timeout was performed prior to the initiation of the procedure. The existing 10 [place] biliary tube was cut and removed over an Amplatz wire. An 8 [place] vascular sheath was advanced  over the wire and into the duodenum. A pull-back cholangiogram was performed. Persisting complete long segment occlusion of the common bile duct. There is some persistent intra and extrahepatic biliary ductal dilatation. No obvious debris or stones. While keeping the Amplatz wire within the duodenum as a safety wire, an angled catheter was advanced through the 8 French sheath coaxially and positioned in the duodenum. The tip of the Bentson wire was then advanced through the Kumpe the catheter in used to measure the length of the common bile duct stenosis which measures approximately 5 cm. Therefore, we will proceed with placement of a 10 x 60 mm wall flex self expanding covered metal biliary stent. The stent was selected in advanced over the Amplatz wire. The stent was then successfully deployed. There was excellent opening of the proximal and distal landing zone and partial opening in the region of the stricture. There was immediate evacuation of contrast an bile through the stent. Given the patient's past history of colon check his, the decision was made to leave a  internal biliary drain into a we have confirmed that the stent drainage is sufficient. The Amplatz wire was withdrawn through the stent and coiled in the hepatic biliary confluence. The 8 French sheath was removed. A new 10.2 [place] cook all-purpose drainage catheter was advanced over the wire and coiled in the region of the biliary confluence. The catheter was gently flushed and capped. The patient tolerated the procedure well. IMPRESSION: 1. 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 2. Successful placement of a 10 x 60 wall flex self expanding covered metallic stent. 3. A 10 French internal biliary drain was left in place at the hepatic confluence proximal to the stent. This tube will be left capped for a physiologic trial to insure that the metal stent is sufficient. PLAN: 1. Patient developed fever and rigors several  hours following the procedure requiring admission for observation. This is likely secondary to transient bacteremia related to colonization of the bile and today's manipulation. 2. Biliary drain should be left to bag drainage overnight. If the patient is feeling well and ready for discharge tomorrow, the drain can be capped prior to discharge. 3. Patient to return Interventional Radiology in 4 weeks for cholangiogram and possible drain removal. A complete metabolic panel will be drawn at that time to assess bilirubin. 4. The family understands that if she develops symptoms of recurrent biliary obstruction or jaundice during the four-week physiologic trial they should immediately connect the drainage catheter back to gravity bag drainage. A was noted the patient is currently in a rehab facility. Therefore, these instructions were written down on a progress note for the family to give to her caregivers. Signed, Criselda Peaches, MD [facility] Radiology Electronically Signed   By: Jacqulynn Cadet M.D.   On: 05/19/2016 16:45    Labs:  CBC:  Recent Labs  05/19/16 0726 05/19/16 1906 05/20/16 0550 05/21/16 0525  WBC 8.2 13.9* 14.1* 10.7*  HGB 10.9* 11.5* 10.5* 8.7*  HCT 33.9* 35.0* 32.6* 27.2*  PLT 405* 385 360 297    COAGS:  Recent Labs  03/15/16 1116 05/05/16 0023 05/19/16 0726 05/20/16 0550  INR 0.94 0.97 0.97 1.17  APTT 30 32 34  --     BMP:  Recent Labs  05/19/16 0726 05/19/16 1906 05/20/16 0550 05/21/16 0525  NA 136 136 138 135  K 3.3* 3.4* 3.7 3.2*  CL 91* 93* 98* 99*  CO2 30 31 29 28   GLUCOSE 281* 246* 232* 247*  BUN 22* 25* 23* 16  CALCIUM 8.7* 8.2* 7.9* 8.1*  CREATININE 1.31* 1.13* 1.05* 0.76  GFRNONAA 40* 48* 53* >60  GFRAA 47* 56* >60 >60    LIVER FUNCTION TESTS:  Recent Labs  05/13/16 0413 05/19/16 0726 05/19/16 1906 05/20/16 0550  BILITOT 2.4* 2.2* 2.0* 1.9*  AST 30 31 37 30  ALT 24 19 21 19     ALKPHOS 596* 458* 436* 384*  PROT 5.6* 7.5 7.0 6.6  ALBUMIN 1.7* 2.9* 2.7* 2.4*    Assessment and Plan:  Panc Ca Biliary drain inplace Draining well   Electronically Signed: Bergen Melle A 05/21/2016, 9:22 AM   I spent a total of 15 Minutes at the the patient's bedside AND on the patient's [facility] floor or unit, greater than 50% of which was counseling/coordinating care for biliary drain

## 2016-05-21 NOTE — Consult Note (Signed)
[place] [facility]    Date of Admission:  05/19/2016           Day 3 piperacillin tazobactam        Day 1 fluconazole       Reason for Consult: Postprocedure cholangitis    Referring Physician: Dr. Jimmy Picket Arrien  Principal Problem:   Fever Active Problems:   Cholangitis   Malignant neoplasm of head of pancreas ([place])   Dyslipidemia   RESTLESS LEGS SYNDROME   Essential hypertension   Chronic diastolic heart failure (HCC)   MDD (major depressive disorder), single episode, severe (HCC)   Diabetic peripheral neuropathy (HCC)   Hyperbilirubinemia   . carvedilol  25 mg Oral BID WC  . feeding supplement (ENSURE ENLIVE)  237 mL Oral TID BM  . fluconazole (DIFLUCAN) IV  400 mg Intravenous Q24H  . fluconazole (DIFLUCAN) IV  400 mg Intravenous Once  . heparin  5,000 Units Subcutaneous Q8H  . insulin aspart  0-9 Units Subcutaneous TID WC  . insulin glargine  30 Units Subcutaneous Daily  . lactulose  20 g Oral Daily  . piperacillin-tazobactam (ZOSYN)  IV  3.375 g Intravenous Q8H  . potassium chloride  40 mEq Oral Q4H  . pregabalin  200 mg Oral BID  . rOPINIRole  2 mg Oral BID    Recommendations: 1. Agree with piperacillin tazobactam and fluconazole pending final cultures and further observation 2. We will follow-up on Monday, 05/23/2016   Assessment: She became septic after recent biliary stent placement and drain exchange. She has improved with broad empiric antibacterial therapy. I agree with the addition of fluconazole given yeast growing from cultures. She should not need a long course of antimicrobial therapy with the new metal stent in place. We will follow up early next week.    HPI: Melissa Myers is a 71 y.o. female who was admitted to the hospital in January with nausea, vomiting and hyperbilirubinemia. She was found to have a biliary mass and underwent ERCP with sphincterotomy and placement of a plastic stent across a biliary  stricture. She underwent endoscopic biopsy on 04/28/2016 which confirmed adenocarcinoma. She was admitted to the hospital on 05/04/2016 with fever and confusion. A urine culture grew Klebsiella. She underwent placement of an external biliary drain and bile cultures grew pan 10 AM, enterococcus and viridans strep. She received 10 days of piperacillin tazobactam a.m. with improvement. On 05/19/2016 she underwent exchange of her external biliary drain and placement of a new internal metal stent. She became febrile, confused and septic following the procedure. He was readmitted and started back on piperacillin tazobactam. She received 1 dose of vancomycin. Blood cultures are negative. Bile cultures have grown yeast. She defervesced promptly and is feeling much better today. Fluconazole was added this morning.   Review of Systems: Review of Systems  Constitutional: Positive for fever and malaise/fatigue. Negative for chills, diaphoresis and weight loss.  HENT: Negative for sore throat.   Respiratory: Negative for cough, sputum production and shortness of breath.   Cardiovascular: Negative for chest pain.  Gastrointestinal: Positive for abdominal pain and diarrhea. Negative for nausea and vomiting.       She is having pain around her exit terminal drain site. There's been no change in her chronic loose stools.  Genitourinary: Negative for dysuria and frequency.  Musculoskeletal: Negative for joint pain and myalgias.  Skin: Negative for rash.  Neurological: Negative for dizziness and headaches.  Past Medical History:  Diagnosis Date  . Anemia   . Cellulitis    LOWER EXTREMITIES  . Chronic diarrhea    a/w nausea - felt related to IBS  . Deaf    left side only  . Diastolic CHF ([place])   . Disc degeneration, lumbar   . GERD (gastroesophageal reflux disease)   . Hyperlipidemia    hx rhabdo on statins  . Hypertension   . Neuropathy (HCC)    feet, toes and fingers  . On home oxygen therapy     uses oxygen 2 liters min per [place] at night and prn during day  . OSA (obstructive sleep apnea)    05/2009 sleep study - refuses CPAP  . Osteoarthritis   . RLS (restless legs syndrome)   . Shortness of breath    chronic  . Stasis dermatitis   . Type II or unspecified type diabetes mellitus without mention of complication, not stated as uncontrolled    insulin dep    Social History  Substance Use Topics  . Smoking status: Never Smoker  . Smokeless tobacco: Never Used  . Alcohol use No    Family History  Problem Relation Age of Onset  . Heart disease Mother   . Heart attack Mother 17  . Heart disease Father   . Heart attack Father 81  . Heart disease      family history  . Stomach cancer Paternal Grandmother   . Lung cancer Paternal Grandfather   . CVA      several aunts  . Heart attack      several aunts and an uncle  . Colon cancer Neg Hx    Allergies  Allergen Reactions  . Cymbalta [Duloxetine Hcl] Other (See Comments)    Restless leg syndrome  . Statins Other (See Comments)    Statin drugs cause muscle pain / "muscle damage"--was told by MD not to take  . Sulfa Antibiotics Diarrhea  . Levemir [Insulin Detemir] Itching  . Morphine Other (See Comments)    GI upset and headaches  . Zinc Swelling and Rash    OBJECTIVE: Blood pressure (!) 121/54, pulse (!) 49, temperature 97.7 F (36.5 C), temperature source Oral, resp. rate 18, height 5\' 2"  (1.575 m), weight 224 lb 13.9 oz (102 kg), SpO2 99 %.  Physical Exam  Constitutional: She is oriented to person, place, and time.  She is alert, comfortable and in good spirits sitting up in a chair.  HENT:  Mouth/Throat: No oropharyngeal exudate.  Eyes: Conjunctivae are normal.  Cardiovascular: Normal rate and regular rhythm.   Murmur heard. 2/6 systolic murmur heard best at the left upper sternal border.  Pulmonary/Chest: Effort normal. She has no wheezes. She has rales.  Abdominal: Soft. She exhibits no mass. There is  tenderness.  Mild, focal tenderness around right upper quadrant drain. Dark green bile in drain bag.  Musculoskeletal: Normal range of motion.  Neurological: She is alert and oriented to person, place, and time.  Skin: No rash noted.  Psychiatric: Mood and affect normal.    Lab Results Lab Results  Component Value Date   WBC 10.7 (H) 05/21/2016   HGB 8.7 (L) 05/21/2016   HCT 27.2 (L) 05/21/2016   MCV 88.9 05/21/2016   PLT 297 05/21/2016    Lab Results  Component Value Date   CREATININE 0.76 05/21/2016   BUN 16 05/21/2016   NA 135 05/21/2016   K 3.2 (L) 05/21/2016   CL 99 (L) 05/21/2016  CO2 28 05/21/2016    Lab Results  Component Value Date   ALT 19 05/20/2016   AST 30 05/20/2016   ALKPHOS 384 (H) 05/20/2016   BILITOT 1.9 (H) 05/20/2016     Microbiology: Recent Results (from the past 240 hour(s))  Culture, Blood     Status: None (Preliminary result)   Collection Time: 05/19/16  3:17 PM  Result Value Ref Range Status   BLOOD CULTURE, ROUTINE Preliminary report  Preliminary   RESULT 1 Comment  Preliminary    Comment: No growth detected at this time. Received aerobic bottle only.   Culture, Blood     Status: None (Preliminary result)   Collection Time: 05/19/16  3:18 PM  Result Value Ref Range Status   BLOOD CULTURE, ROUTINE Preliminary report  Preliminary   RESULT 1 Comment  Preliminary    Comment: No growth detected at this time. Received aerobic bottle only.   Culture, body fluid-bottle     Status: None (Preliminary result)   Collection Time: 05/20/16  1:50 PM  Result Value Ref Range Status   Specimen Description BILE  Final   Special Requests NONE  Final   Gram Stain   Final    YEAST IN BOTH AEROBIC AND ANAEROBIC BOTTLES CRITICAL RESULT CALLED TO, READ BACK BY AND VERIFIED WITH: M O'BRYANT,RN @0558  05/21/16 MKELLY,MLT Performed at [facility], [address], [place], [place] [address]    Culture PENDING  Incomplete   Report Status PENDING   Incomplete  Gram stain     Status: None (Preliminary result)   Collection Time: 05/20/16  1:50 PM  Result Value Ref Range Status   Specimen Description BILE  Final   Special Requests NONE  Final   Gram Stain   Final    NO WBC SEEN NO ORGANISMS SEEN Performed at [facility], [place] [address] [place], [place] [address]    Report Status PENDING  Incomplete    Michel Bickers, MD [facility] [phone] pager   [phone] cell 05/21/2016, 12:36 PM

## 2016-05-21 NOTE — Progress Notes (Signed)
PROGRESS NOTE    Melissa Myers  NLZ:[id number] DOB: 12/31/1945 DOA: 05/19/2016 PCP: [name], MD    Brief Narrative:  71 yo female with pancreatic cancer with biliary obstruction, presented with fever after exchange of percutaneous biliary drain. Suspected transient bacteremia. Started on broad spectrum antibiotics and IV fluids. Biliary culture positive for yeast, added IV fluconazole.    Assessment & Plan:   Principal Problem:   Fever Active Problems:   Dyslipidemia   RESTLESS LEGS SYNDROME   Essential hypertension   Chronic diastolic heart failure (HCC)   MDD (major depressive disorder), single episode, severe (HCC)   Diabetic peripheral neuropathy (HCC)   Hyperbilirubinemia   Pancreatic mass   SIRS (systemic inflammatory response syndrome) ([place])   1. Sepsis related to bilary drain exchange, transient bacteremia. Continue antibiotic therapy with Zosyn, biliary culture positive for yeast. Change fluconazole to IV and will call ID for further recommendations.   2. Hyperbilirubinemia. Drain successfully exchanged, no significant abdominal pain, will check lft in am.   3. Pancreatic cancer. Plan to return to SNF. No cancer therapy until patient physically more strong.  4. Hypertension. Continue to hold on antihypertensive agents.   5. Chronic and stable diastolic heart failure.  Clinically euvolemic, will hold on IV fluids for now, patient tolerating po well.   6. Urine tract infection. Continue zosyn IV #2. Urine culture from 05/05/16, klebsiella sensitive to zosyn.   7. T2DM. Continue glucose cover and monitoring with iss and basal insulin levimir. Capillary glucose  [phone]   DVT prophylaxis: enoxaparin  Code Status: full  Family Communication: no family at the bedside  Disposition Plan: home   Consultants:   Interventional radiology    Procedures:      Antimicrobials:   Zosyn    Consultants:   Interventional radiology      Procedures:      Antimicrobials:   Zosyn  Fluconazole     Subjective: Patient with no chest pain or dyspnea, no nausea or vomiting. Tolerating po well.  Objective: Vitals:   05/20/16 1347 05/20/16 2151 05/21/16 0436 05/21/16 0800  BP: (!) 129/44 (!) 128/46 (!) 125/59 (!) 121/54  Pulse: 66 (!) 54 (!) 51 (!) 49  Resp: 16 18 18    Temp: 98.8 F (37.1 C) 98.2 F (36.8 C) 98 F (36.7 C) 97.7 F (36.5 C)  TempSrc: Oral Oral Oral Oral  SpO2: 93% 93% 100% 99%  Weight:      Height:        Intake/Output Summary (Last 24 hours) at 05/21/16 3299 Last data filed at 05/21/16 0800  Gross per 24 hour  Intake              710 ml  Output             1000 ml  Net             -290 ml   Filed Weights   05/19/16 1750  Weight: 102 kg (224 lb 13.9 oz)    Examination:  General exam: deconditioned E ENT: Mild pallor but no icterus, oral mucosa moist. Respiratory system: Clear to auscultation. Respiratory effort normal. Mild decreased breath sounds.  Cardiovascular system: S1 & S2 heard, RRR. No JVD, murmurs, rubs, gallops or clicks. No pedal edema. Gastrointestinal system: Abdomen is protuberant, mild distended, soft and nontender. No organomegaly or masses felt. Normal bowel sounds heard. Biliary drain in place.  Central nervous system: Alert and oriented. No focal neurological deficits. Extremities: Symmetric 5 x  5 power. Skin: No rashes, lesions or ulcers     Data Reviewed: I have personally reviewed following labs and imaging studies  CBC:  Recent Labs Lab 05/19/16 0726 05/19/16 1906 05/20/16 0550 05/21/16 0525  WBC 8.2 13.9* 14.1* 10.7*  NEUTROABS 5.2 11.3* 11.2* 7.6  HGB 10.9* 11.5* 10.5* 8.7*  HCT 33.9* 35.0* 32.6* 27.2*  MCV 85.6 87.1 88.1 88.9  PLT 405* 385 360 308   Basic Metabolic Panel:  Recent Labs Lab 05/19/16 0726 05/19/16 1906 05/19/16 1920 05/20/16 0550 05/21/16 0525  NA 136 136  --  138 135  K 3.3* 3.4*  --  3.7 3.2*  CL 91* 93*  --   98* 99*  CO2 30 31  --  29 28  GLUCOSE 281* 246*  --  232* 247*  BUN 22* 25*  --  23* 16  CREATININE 1.31* 1.13*  --  1.05* 0.76  CALCIUM 8.7* 8.2*  --  7.9* 8.1*  MG  --  1.2*  --   --   --   PHOS  --   --  3.7  --   --    GFR: Estimated Creatinine Clearance: 73.2 mL/min (by C-G formula based on SCr of 0.76 mg/dL). Liver Function Tests:  Recent Labs Lab 05/19/16 0726 05/19/16 1906 05/20/16 0550  AST 31 37 30  ALT 19 21 19   ALKPHOS 458* 436* 384*  BILITOT 2.2* 2.0* 1.9*  PROT 7.5 7.0 6.6  ALBUMIN 2.9* 2.7* 2.4*   No results for input(s): LIPASE, AMYLASE in the last 168 hours. No results for input(s): AMMONIA in the last 168 hours. Coagulation Profile:  Recent Labs Lab 05/19/16 0726 05/20/16 0550  INR 0.97 1.17   Cardiac Enzymes: No results for input(s): CKTOTAL, CKMB, CKMBINDEX, TROPONINI in the last 168 hours. BNP (last 3 results)  Recent Labs  11/04/15 0956  PROBNP 20.0   HbA1C: No results for input(s): HGBA1C in the last 72 hours. CBG:  Recent Labs Lab 05/19/16 0709 05/20/16 0016 05/20/16 0739 05/20/16 1205 05/21/16 0759  GLUCAP 281* 219* 234* 250* 231*   Lipid Profile: No results for input(s): CHOL, HDL, LDLCALC, TRIG, CHOLHDL, LDLDIRECT in the last 72 hours. Thyroid Function Tests: No results for input(s): TSH, T4TOTAL, FREET4, T3FREE, THYROIDAB in the last 72 hours. Anemia Panel: No results for input(s): VITAMINB12, FOLATE, FERRITIN, TIBC, IRON, RETICCTPCT in the last 72 hours. Sepsis Labs:  Recent Labs Lab 05/19/16 2002  LATICACIDVEN 1.8    Recent Results (from the past 240 hour(s))  Culture, Blood     Status: None (Preliminary result)   Collection Time: 05/19/16  3:17 PM  Result Value Ref Range Status   BLOOD CULTURE, ROUTINE Preliminary report  Preliminary   RESULT 1 Comment  Preliminary    Comment: No growth detected at this time. Received aerobic bottle only.   Culture, Blood     Status: None (Preliminary result)   Collection  Time: 05/19/16  3:18 PM  Result Value Ref Range Status   BLOOD CULTURE, ROUTINE Preliminary report  Preliminary   RESULT 1 Comment  Preliminary    Comment: No growth detected at this time. Received aerobic bottle only.   Culture, body fluid-bottle     Status: None (Preliminary result)   Collection Time: 05/20/16  1:50 PM  Result Value Ref Range Status   Specimen Description BILE  Final   Special Requests NONE  Final   Gram Stain   Final    YEAST IN BOTH AEROBIC AND ANAEROBIC  BOTTLES CRITICAL RESULT CALLED TO, READ BACK BY AND VERIFIED WITH: Precious Haws @0558  05/21/16 MKELLY,MLT Performed at [facility], [place] [address], [place], [place] [address]    Culture PENDING  Incomplete   Report Status PENDING  Incomplete  Gram stain     Status: None (Preliminary result)   Collection Time: 05/20/16  1:50 PM  Result Value Ref Range Status   Specimen Description BILE  Final   Special Requests NONE  Final   Gram Stain   Final    NO WBC SEEN NO ORGANISMS SEEN Performed at [facility], [place] [address], [place], [place] [address]    Report Status PENDING  Incomplete         Radiology Studies: Ir Biliary Stent(s) Existing Access Inc Dilation Cath Exchange  Result Date: 05/19/2016 INDICATION: 71 year old female with pancreatic cancer and malignant obstructed jaundice. A percutaneous transhepatic biliary drain was placed on 05/07/2016 via a right posterior duct approach. She is now doing better and presents for placement of an internal stent. EXAM: 1. Cholangiogram through existing access 2. Placement of a 10 x 60 mm wall flex self expanding covered metallic stent 3. Biliary drain exchange MEDICATIONS: 3.375 g Zosyn; The antibiotic was administered within an appropriate time frame prior to the initiation of the procedure. ANESTHESIA/SEDATION: Moderate (conscious) sedation was employed during this procedure. A total of Versed 3.5 mg and Fentanyl 100 mcg was administered  intravenously. Moderate Sedation Time: 14 minutes. The patient's level of consciousness and vital signs were monitored continuously by radiology nursing throughout the procedure under my direct supervision. FLUOROSCOPY TIME:  Fluoroscopy Time: 0 minutes 5 seconds (48 mGy). COMPLICATIONS: SIR LEVEL B - Normal therapy, includes overnight admission for observation. Approximately 2 hours after discharge the patient became febrile with rigors while in her oncologist's office. She was admitted overnight for observation, IV fluids and additional antibiotics. PROCEDURE: Informed written consent was obtained from the patient after a thorough discussion of the procedural risks, benefits and alternatives. All questions were addressed. Maximal Sterile Barrier Technique was utilized including caps, mask, sterile gowns, sterile gloves, sterile drape, hand hygiene and skin antiseptic. A timeout was performed prior to the initiation of the procedure. The existing 10 [place] biliary tube was cut and removed over an Amplatz wire. An 8 [place] vascular sheath was advanced over the wire and into the duodenum. A pull-back cholangiogram was performed. Persisting complete long segment occlusion of the common bile duct. There is some persistent intra and extrahepatic biliary ductal dilatation. No obvious debris or stones. While keeping the Amplatz wire within the duodenum as a safety wire, an angled catheter was advanced through the 8 French sheath coaxially and positioned in the duodenum. The tip of the Bentson wire was then advanced through the [place] the catheter in used to measure the length of the common bile duct stenosis which measures approximately 5 cm. Therefore, we will proceed with placement of a 10 x 60 mm wall flex self expanding covered metal biliary stent. The stent was selected in advanced over the Amplatz wire. The stent was then successfully deployed. There was excellent opening of the proximal and distal landing zone and  partial opening in the region of the stricture. There was immediate evacuation of contrast an bile through the stent. Given the patient's past history of colon check his, the decision was made to leave a internal biliary drain into a we have confirmed that the stent drainage is sufficient. The Amplatz wire was withdrawn through  the stent and coiled in the hepatic biliary confluence. The 8 French sheath was removed. A new 10.2 [place] cook all-purpose drainage catheter was advanced over the wire and coiled in the region of the biliary confluence. The catheter was gently flushed and capped. The patient tolerated the procedure well. IMPRESSION: 1. 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 2. Successful placement of a 10 x 60 wall flex self expanding covered metallic stent. 3. A 10 French internal biliary drain was left in place at the hepatic confluence proximal to the stent. This tube will be left capped for a physiologic trial to insure that the metal stent is sufficient. PLAN: 1. Patient developed fever and rigors several hours following the procedure requiring admission for observation. This is likely secondary to transient bacteremia related to colonization of the bile and today's manipulation. 2. Biliary drain should be left to bag drainage overnight. If the patient is feeling well and ready for discharge tomorrow, the drain can be capped prior to discharge. 3. Patient to return Interventional Radiology in 4 weeks for cholangiogram and possible drain removal. A complete metabolic panel will be drawn at that time to assess bilirubin. 4. The family understands that if she develops symptoms of recurrent biliary obstruction or jaundice during the four-week physiologic trial they should immediately connect the drainage catheter back to gravity bag drainage. A was noted the patient is currently in a rehab facility. Therefore, these instructions were written down on a progress note for the  family to give to her caregivers. Signed, Criselda Peaches, MD [facility] Radiology Electronically Signed   By: Jacqulynn Cadet M.D.   On: 05/19/2016 16:45        Scheduled Meds: . carvedilol  25 mg Oral BID WC  . feeding supplement (ENSURE ENLIVE)  237 mL Oral TID BM  . fluconazole (DIFLUCAN) IV  400 mg Intravenous Q24H  . heparin  5,000 Units Subcutaneous Q8H  . insulin aspart  0-9 Units Subcutaneous TID WC  . insulin glargine  30 Units Subcutaneous Daily  . lactulose  20 g Oral Daily  . piperacillin-tazobactam (ZOSYN)  IV  3.375 g Intravenous Q8H  . pregabalin  200 mg Oral BID  . rOPINIRole  2 mg Oral BID   Continuous Infusions: . sodium chloride 75 mL/hr at 05/21/16 0174     LOS: 1 day      [name], MD [facility] Pager [phone]  If 7PM-7AM, please contact night-coverage [email or web address] Password [facility] 05/21/2016, 9:37 AM

## 2016-05-21 NOTE — Progress Notes (Signed)
Pharmacy Antibiotic Note  Melissa Myers is a 71 y.o. female presenting on 05/19/2016 with fevers and delirium. Current pancreatic cancer with recent placement of perc transhepatic biliary drain which had grown out enterococcus and Pantoea on 3/17. Also with Klebsiella in urine at that time.  Pharmacy has been consulted for Zosyn/vanc dosing.  Today, 05/21/2016:  Vancomycin discontinued by MD  Patient remains stable overnight; however now growing yeast in biliary fluid. Pharmacy consulted to dose Fluconazole IV (400 mg daily ordered by MD)  Plan:  Continue Zosyn 3.375 g IV given every 8 hrs by 4-hr infusion  Will give an additional 400 mg fluconazole IV x 1 for 800 mg total load, then 400 mg IV daily. Dosing for invasive candidiasis is typically 6 mg/kg, so would use 600 mg/dose in this 100-kg patient. However, since patient is normotensive, afebrile, and with resolving WBC, will use conventional dosing    Height: 5\' 2"  (157.5 cm) Weight: 224 lb 13.9 oz (102 kg) IBW/kg (Calculated) : 50.1  Temp (24hrs), Avg:98.2 F (36.8 C), Min:97.7 F (36.5 C), Max:98.8 F (37.1 C)   Recent Labs Lab 05/19/16 0726 05/19/16 1906 05/19/16 2002 05/20/16 0550 05/21/16 0525  WBC 8.2 13.9*  --  14.1* 10.7*  CREATININE 1.31* 1.13*  --  1.05* 0.76  LATICACIDVEN  --   --  1.8  --   --     Estimated Creatinine Clearance: 73.2 mL/min (by C-G formula based on SCr of 0.76 mg/dL).    Allergies  Allergen Reactions  . Cymbalta [Duloxetine Hcl] Other (See Comments)    Restless leg syndrome  . Statins Other (See Comments)    Statin drugs cause muscle pain / "muscle damage"--was told by MD not to take  . Sulfa Antibiotics Diarrhea  . Levemir [Insulin Detemir] Itching  . Morphine Other (See Comments)    GI upset and headaches  . Zinc Swelling and Rash    Antimicrobials this admission: 3/29 vanc >>3/30 3/29 Zosyn >> 3/31 Diflucan >>  Dose adjustments this admission: ---  Microbiology  results: 3/30 BCx: sent 3/30 bile: yeast  3/17 bili drain cx: pantoea species (pansensitive) and enterococcus ( S- ampicillin and vanc, R to gent synergy - only three things ran)  3/15: UCx Klebsiella ( R- Amp, Unasyn, I - nitrofurantoin S- cefazolin, ceftriaxone, ciprofloxacin, gent, imipenem, Zosyn, Bactrim)   Thank you for allowing pharmacy to be a part of this patient's care.   Reuel Boom, PharmD, BCPS Pager: [phone] 05/21/2016, 10:51 AM

## 2016-05-22 LAB — CBC WITH DIFFERENTIAL/PLATELET
BASOS ABS: 0 10*3/uL (ref 0.0–0.1)
BASOS PCT: 0 %
EOS ABS: 0.2 10*3/uL (ref 0.0–0.7)
EOS PCT: 3 %
HEMATOCRIT: 27.3 % — AB (ref 36.0–46.0)
Hemoglobin: 8.6 g/dL — ABNORMAL LOW (ref 12.0–15.0)
Lymphocytes Relative: 25 %
Lymphs Abs: 2.2 10*3/uL (ref 0.7–4.0)
MCH: 28.2 pg (ref 26.0–34.0)
MCHC: 31.5 g/dL (ref 30.0–36.0)
MCV: 89.5 fL (ref 78.0–100.0)
MONO ABS: 0.7 10*3/uL (ref 0.1–1.0)
Monocytes Relative: 8 %
NEUTROS ABS: 5.7 10*3/uL (ref 1.7–7.7)
Neutrophils Relative %: 64 %
PLATELETS: 308 10*3/uL (ref 150–400)
RBC: 3.05 MIL/uL — ABNORMAL LOW (ref 3.87–5.11)
RDW: 15.6 % — AB (ref 11.5–15.5)
WBC: 8.9 10*3/uL (ref 4.0–10.5)

## 2016-05-22 LAB — HEPATIC FUNCTION PANEL
ALBUMIN: 2.1 g/dL — AB (ref 3.5–5.0)
ALT: 14 U/L (ref 14–54)
AST: 22 U/L (ref 15–41)
Alkaline Phosphatase: 330 U/L — ABNORMAL HIGH (ref 38–126)
Bilirubin, Direct: 0.6 mg/dL — ABNORMAL HIGH (ref 0.1–0.5)
Indirect Bilirubin: 0.6 mg/dL (ref 0.3–0.9)
TOTAL PROTEIN: 6.1 g/dL — AB (ref 6.5–8.1)
Total Bilirubin: 1.2 mg/dL (ref 0.3–1.2)

## 2016-05-22 LAB — BASIC METABOLIC PANEL
ANION GAP: 8 (ref 5–15)
BUN: 10 mg/dL (ref 6–20)
CALCIUM: 8.7 mg/dL — AB (ref 8.9–10.3)
CO2: 27 mmol/L (ref 22–32)
Chloride: 104 mmol/L (ref 101–111)
Creatinine, Ser: 0.69 mg/dL (ref 0.44–1.00)
Glucose, Bld: 161 mg/dL — ABNORMAL HIGH (ref 65–99)
Potassium: 4 mmol/L (ref 3.5–5.1)
SODIUM: 139 mmol/L (ref 135–145)

## 2016-05-22 LAB — GLUCOSE, CAPILLARY
GLUCOSE-CAPILLARY: 198 mg/dL — AB (ref 65–99)
GLUCOSE-CAPILLARY: 236 mg/dL — AB (ref 65–99)
Glucose-Capillary: 187 mg/dL — ABNORMAL HIGH (ref 65–99)
Glucose-Capillary: 184 mg/dL — ABNORMAL HIGH (ref 65–99)

## 2016-05-22 NOTE — Progress Notes (Signed)
PROGRESS NOTE    Melissa Myers  WIO:[id number] DOB: 02-01-1946 DOA: 05/19/2016 PCP: [name], MD    Brief Narrative:  71 yo female with pancreatic cancer with biliary obstruction, presented with fever after exchange of percutaneous biliary drain. Suspected transient bacteremia. Started on broad spectrum antibiotics and IV fluids. Biliary culture positive for yeast, added IV fluconazole. ID consulted.    Assessment & Plan:   Principal Problem:   Fever Active Problems:   Dyslipidemia   RESTLESS LEGS SYNDROME   Essential hypertension   Chronic diastolic heart failure (HCC)   MDD (major depressive disorder), single episode, severe (HCC)   Diabetic peripheral neuropathy (HCC)   Hyperbilirubinemia   Cholangitis   Malignant neoplasm of head of pancreas ([place])   1. Sepsis related to bilary drain exchange, transient bacteremia/ candidemia. For now will continue antibiotic therapy with Zosyn and fluconazole, follow on ID recommendations. Patient has remained afebrile, blood cultures no growth. Biliary culture positive for candida.   2. Hyperbilirubinemia. Positive biliary drain, bilirubin down to 0.6, mild abdominal pain but tolerating po well.  3. Pancreatic cancer.  No cancer therapy until patient physically more strong.  4. Hypertension.  Systolic blood pressure 426. Continue coreg at 25 mg po bid.   5. Chronic and stable diastolic heart failure.  Clinically euvolemic, will hold on IV fluids for now, patient tolerating po well.   6. Urine tract infection. Continue zosyn IV #3.  7. T2DM. Continue glucose cover and monitoring with iss and basal insulin glargine at 30 units. Capillary glucose [phone] [phone]. Patient tolerating po well.    DVT prophylaxis:enoxaparin  Code Status:full  Family Communication:no family at the bedside  Disposition Plan:home   Consultants:   IR  ID  Procedures:   Biliary drain exchamge  Antimicrobials:    Zosyn  Fluconazole   Subjective: Patient with pain on the right upper quadrant, worse to touch, improved with pain medications, no radiation, no associated nausea or vomiting.   Objective: Vitals:   05/21/16 1200 05/21/16 1417 05/21/16 2137 05/22/16 0425  BP: 133/76 (!) 139/51 (!) 125/55 (!) 119/46  Pulse: (!) 54 (!) 51 (!) 54 (!) 54  Resp: 18 19 17 18   Temp: 98.2 F (36.8 C) 98.6 F (37 C) 98.2 F (36.8 C) 98.5 F (36.9 C)  TempSrc: Oral Oral Oral Oral  SpO2: 100% 93% 100% 100%  Weight:      Height:        Intake/Output Summary (Last 24 hours) at 05/22/16 0936 Last data filed at 05/22/16 0700  Gross per 24 hour  Intake             2457 ml  Output             1000 ml  Net             1457 ml   Filed Weights   05/19/16 1750  Weight: 102 kg (224 lb 13.9 oz)    Examination:  General exam: deconditioned E ENT: mild pallor, no icterus, oral mucosa moist.  Respiratory system: Clear to auscultation. Respiratory effort normal. Mild decreased breath sounds at bases.  Cardiovascular system: S1 & S2 heard, RRR. No JVD, murmurs, rubs, gallops or clicks. Trace non pitting pedal edema. Gastrointestinal system: Abdomen is nondistended, soft and nontender. No organomegaly or masses felt. Normal bowel sounds heard. Central nervous system: Alert and oriented. No focal neurological deficits. Extremities: Symmetric 5 x 5 power. Skin: No rashes, lesions or ulcers     Data  Reviewed: I have personally reviewed following labs and imaging studies  CBC:  Recent Labs Lab 05/19/16 0726 05/19/16 1906 05/20/16 0550 05/21/16 0525 05/22/16 0525  WBC 8.2 13.9* 14.1* 10.7* 8.9  NEUTROABS 5.2 11.3* 11.2* 7.6 5.7  HGB 10.9* 11.5* 10.5* 8.7* 8.6*  HCT 33.9* 35.0* 32.6* 27.2* 27.3*  MCV 85.6 87.1 88.1 88.9 89.5  PLT 405* 385 360 297 831   Basic Metabolic Panel:  Recent Labs Lab 05/19/16 0726 05/19/16 1906 05/19/16 1920 05/20/16 0550 05/21/16 0525 05/22/16 0525  NA 136 136   --  138 135 139  K 3.3* 3.4*  --  3.7 3.2* 4.0  CL 91* 93*  --  98* 99* 104  CO2 30 31  --  29 28 27   GLUCOSE 281* 246*  --  232* 247* 161*  BUN 22* 25*  --  23* 16 10  CREATININE 1.31* 1.13*  --  1.05* 0.76 0.69  CALCIUM 8.7* 8.2*  --  7.9* 8.1* 8.7*  MG  --  1.2*  --   --   --   --   PHOS  --   --  3.7  --   --   --    GFR: Estimated Creatinine Clearance: 73.2 mL/min (by C-G formula based on SCr of 0.69 mg/dL). Liver Function Tests:  Recent Labs Lab 05/19/16 0726 05/19/16 1906 05/20/16 0550 05/22/16 0525  AST 31 37 30 22  ALT 19 21 19 14   ALKPHOS 458* 436* 384* 330*  BILITOT 2.2* 2.0* 1.9* 1.2  PROT 7.5 7.0 6.6 6.1*  ALBUMIN 2.9* 2.7* 2.4* 2.1*   No results for input(s): LIPASE, AMYLASE in the last 168 hours. No results for input(s): AMMONIA in the last 168 hours. Coagulation Profile:  Recent Labs Lab 05/19/16 0726 05/20/16 0550  INR 0.97 1.17   Cardiac Enzymes: No results for input(s): CKTOTAL, CKMB, CKMBINDEX, TROPONINI in the last 168 hours. BNP (last 3 results)  Recent Labs  11/04/15 0956  PROBNP 20.0   HbA1C: No results for input(s): HGBA1C in the last 72 hours. CBG:  Recent Labs Lab 05/21/16 0759 05/21/16 1154 05/21/16 1644 05/21/16 2204 05/22/16 0740  GLUCAP 231* 244* 333* 258* 187*   Lipid Profile: No results for input(s): CHOL, HDL, LDLCALC, TRIG, CHOLHDL, LDLDIRECT in the last 72 hours. Thyroid Function Tests: No results for input(s): TSH, T4TOTAL, FREET4, T3FREE, THYROIDAB in the last 72 hours. Anemia Panel: No results for input(s): VITAMINB12, FOLATE, FERRITIN, TIBC, IRON, RETICCTPCT in the last 72 hours. Sepsis Labs:  Recent Labs Lab 05/19/16 2002  LATICACIDVEN 1.8    Recent Results (from the past 240 hour(s))  Culture, Blood     Status: None (Preliminary result)   Collection Time: 05/19/16  3:17 PM  Result Value Ref Range Status   BLOOD CULTURE, ROUTINE Preliminary report  Preliminary   RESULT 1 Comment  Preliminary     Comment: No growth detected at this time. Received aerobic bottle only.   Culture, Blood     Status: None (Preliminary result)   Collection Time: 05/19/16  3:18 PM  Result Value Ref Range Status   BLOOD CULTURE, ROUTINE Preliminary report  Preliminary   RESULT 1 Comment  Preliminary    Comment: No growth detected at this time. Received aerobic bottle only.   Culture, body fluid-bottle     Status: None (Preliminary result)   Collection Time: 05/20/16  1:50 PM  Result Value Ref Range Status   Specimen Description BILE  Final   Special Requests  NONE  Final   Gram Stain   Final    YEAST IN BOTH AEROBIC AND ANAEROBIC BOTTLES CRITICAL RESULT CALLED TO, READ BACK BY AND VERIFIED WITH: M O'BRYANT,RN @0558  05/21/16 MKELLY,MLT    Culture   Final    NO GROWTH < 24 HOURS Performed at [facility], [address], [place], [place] [address]    Report Status PENDING  Incomplete  Gram stain     Status: None (Preliminary result)   Collection Time: 05/20/16  1:50 PM  Result Value Ref Range Status   Specimen Description BILE  Final   Special Requests NONE  Final   Gram Stain   Final    NO WBC SEEN NO ORGANISMS SEEN Performed at [facility], [place] [address], [place], [place] [address]    Report Status PENDING  Incomplete  Culture, blood (routine x 2)     Status: None (Preliminary result)   Collection Time: 05/20/16  5:11 PM  Result Value Ref Range Status   Specimen Description BLOOD LEFT ANTECUBITAL  Final   Special Requests BOTTLES DRAWN AEROBIC AND ANAEROBIC 6CC  Final   Culture   Final    NO GROWTH < 24 HOURS Performed at [facility], [place] [address], [place], [place] [address]    Report Status PENDING  Incomplete  Culture, blood (routine x 2)     Status: None (Preliminary result)   Collection Time: 05/20/16  5:18 PM  Result Value Ref Range Status   Specimen Description BLOOD LEFT HAND  Final   Special Requests BOTTLES DRAWN AEROBIC ONLY 5.5CC  Final   Culture    Final    NO GROWTH < 24 HOURS Performed at [facility], [place] [address] [place], [place] [address]    Report Status PENDING  Incomplete         Radiology Studies: No results found.      Scheduled Meds: . carvedilol  25 mg Oral BID WC  . feeding supplement (ENSURE ENLIVE)  237 mL Oral TID BM  . fluconazole (DIFLUCAN) IV  400 mg Intravenous Q24H  . heparin  5,000 Units Subcutaneous Q8H  . insulin aspart  0-9 Units Subcutaneous TID WC  . insulin glargine  30 Units Subcutaneous Daily  . lactulose  20 g Oral Daily  . piperacillin-tazobactam (ZOSYN)  IV  3.375 g Intravenous Q8H  . pregabalin  200 mg Oral BID  . rOPINIRole  2 mg Oral BID   Continuous Infusions:   LOS: 2 days      Wylodean Shimmel Gerome Apley, MD [facility] Pager [phone]  If 7PM-7AM, please contact night-coverage [email or web address] Password [facility] 05/22/2016, 9:36 AM

## 2016-05-22 NOTE — Evaluation (Signed)
Physical Therapy Evaluation Patient Details Name: Melissa Myers MRN: [id number] DOB: 12-15-1945 Today's Date: 05/22/2016   History of Present Illness  Pt admitted through ED from [facility] rehab 2* fever, lethargy, confusion and delirium.  Pt s/p bilary stent placement 05/19/16 and with hx of DM, CHF, neuropathy and pancreatic CA  Clinical Impression  Pt admitted as above and presenting with functional mobility limitations 2* generalized weakness and balance deficits.  Pt would benefit from follow up rehab at SNF level to maximize IND and safety prior to return home with limited assist.    Follow Up Recommendations SNF;Supervision/Assistance - 24 hour    Equipment Recommendations  None recommended by PT    Recommendations for Other Services       Precautions / Restrictions Precautions Precautions: Fall Restrictions Weight Bearing Restrictions: No Other Position/Activity Restrictions: WBAT      Mobility  Bed Mobility Overal bed mobility: Needs Assistance Bed Mobility: Rolling;Sidelying to Sit Rolling: Min assist Sidelying to sit: Mod assist       General bed mobility comments: cues for log roll technique and sequence  Transfers Overall transfer level: Needs assistance Equipment used: Rolling walker (2 wheeled) Transfers: Sit to/from Stand Sit to Stand: Min assist;+2 safety/equipment         General transfer comment: cues for transition position and use of UEs to self assist.  Physical assist to bring wt up and fwd and to steady in initial standing.  Ambulation/Gait Ambulation/Gait assistance: Min assist;+2 physical assistance;+2 safety/equipment Ambulation Distance (Feet): 70 Feet Assistive device: Rolling walker (2 wheeled) Gait Pattern/deviations: Step-to pattern;Decreased step length - right;Decreased step length - left;Shuffle;Trunk flexed;Wide base of support Gait velocity: decr Gait velocity interpretation: Below normal speed for age/gender General  Gait Details: cues for pace, posture, position from RW.  Physical assist to balance/support and RW management  Stairs            Wheelchair Mobility    Modified Rankin (Stroke Patients Only)       Balance Overall balance assessment: Needs assistance Sitting-balance support: No upper extremity supported;Feet supported Sitting balance-Leahy Scale: Fair     Standing balance support: Bilateral upper extremity supported Standing balance-Leahy Scale: Poor                               Pertinent Vitals/Pain Pain Assessment: Faces Faces Pain Scale: Hurts little more Pain Location: L abdominal area Pain Descriptors / Indicators: Guarding;Grimacing Pain Intervention(s): Limited activity within patient's tolerance;Monitored during session    [place] expects to be discharged to:: Skilled nursing facility Living Arrangements: Spouse/significant other Available Help at Discharge: Family;Available 24 hours/day Type of Home: House Home Access: Level entry     Home Layout: One level Home Equipment: Walker - 2 wheels;Wheelchair - [employer];Shower seat      Prior Function Level of Independence: Needs assistance   Gait / Transfers Assistance Needed: using walker for short distances around the house.   ADL's / Homemaking Assistance Needed: pt reports she is independent with ADL; husband does grocery shopping        Hand Dominance   Dominant Hand: Right    Extremity/Trunk Assessment   Upper Extremity Assessment Upper Extremity Assessment: Generalized weakness    Lower Extremity Assessment Lower Extremity Assessment: Generalized weakness    Cervical / Trunk Assessment Cervical / Trunk Assessment: Kyphotic  Communication   Communication: No difficulties  Cognition Arousal/Alertness: Awake/alert Behavior During Therapy: WFL for tasks  assessed/performed Overall Cognitive Status: Within Functional Limits for tasks assessed                          Following Commands: Follows one step commands consistently       General Comments: Decreased processing noted but with good follow through      General Comments      Exercises     Assessment/Plan    PT Assessment Patient needs continued PT services  PT Problem List Decreased strength;Decreased balance;Decreased activity tolerance;Decreased mobility       PT Treatment Interventions DME instruction;Gait training;Functional mobility training;Therapeutic activities;Therapeutic exercise;Balance training;Neuromuscular re-education;Patient/family education    PT Goals (Current goals can be found in the Care Plan section)  Acute Rehab PT Goals Patient Stated Goal: get better PT Goal Formulation: With patient/family Time For Goal Achievement: 05/24/16 Potential to Achieve Goals: Fair    Frequency Min 3X/week   Barriers to discharge        Co-evaluation               End of Session   Activity Tolerance: Patient tolerated treatment well Patient left: in chair;with call bell/phone within reach;with chair alarm set Nurse Communication: Mobility status PT Visit Diagnosis: Unsteadiness on feet (R26.81);Muscle weakness (generalized) (M62.81)    Time: 5188-4166 PT Time Calculation (min) (ACUTE ONLY): 23 min   Charges:   PT Evaluation $PT Eval Low Complexity: 1 Procedure PT Treatments $Gait Training: 8-22 mins   PT G Codes:        Melissa Myers 05-25-2016, 12:45 PM

## 2016-05-23 DIAGNOSIS — K830 Cholangitis: Secondary | ICD-10-CM

## 2016-05-23 DIAGNOSIS — E785 Hyperlipidemia, unspecified: Secondary | ICD-10-CM

## 2016-05-23 LAB — CBC WITH DIFFERENTIAL/PLATELET
Basophils Absolute: 0 K/uL (ref 0.0–0.1)
Basophils Relative: 0 %
Eosinophils Absolute: 0.2 K/uL (ref 0.0–0.7)
Eosinophils Relative: 3 %
HCT: 27.2 % — ABNORMAL LOW (ref 36.0–46.0)
Hemoglobin: 8.6 g/dL — ABNORMAL LOW (ref 12.0–15.0)
Lymphocytes Relative: 30 %
Lymphs Abs: 2.4 K/uL (ref 0.7–4.0)
MCH: 27.2 pg (ref 26.0–34.0)
MCHC: 31.6 g/dL (ref 30.0–36.0)
MCV: 86.1 fL (ref 78.0–100.0)
Monocytes Absolute: 0.7 K/uL (ref 0.1–1.0)
Monocytes Relative: 8 %
Neutro Abs: 4.6 K/uL (ref 1.7–7.7)
Neutrophils Relative %: 59 %
Platelets: 324 K/uL (ref 150–400)
RBC: 3.16 MIL/uL — ABNORMAL LOW (ref 3.87–5.11)
RDW: 15.3 % (ref 11.5–15.5)
WBC: 7.8 K/uL (ref 4.0–10.5)

## 2016-05-23 LAB — CULTURE, BODY FLUID W GRAM STAIN -BOTTLE

## 2016-05-23 LAB — BASIC METABOLIC PANEL
ANION GAP: 8 (ref 5–15)
BUN: 8 mg/dL (ref 6–20)
CO2: 27 mmol/L (ref 22–32)
Calcium: 8.9 mg/dL (ref 8.9–10.3)
Chloride: 103 mmol/L (ref 101–111)
Creatinine, Ser: 0.68 mg/dL (ref 0.44–1.00)
GFR calc Af Amer: >60 mL/min (ref >60)
GFR calc non Af Amer: >60 mL/min (ref >60)
GLUCOSE: 142 mg/dL — AB (ref 65–99)
POTASSIUM: 4 mmol/L (ref 3.5–5.1)
Sodium: 138 mmol/L (ref 135–145)

## 2016-05-23 LAB — GLUCOSE, CAPILLARY
GLUCOSE-CAPILLARY: 249 mg/dL — AB (ref 65–99)
GLUCOSE-CAPILLARY: 79 mg/dL (ref 65–99)
Glucose-Capillary: 311 mg/dL — ABNORMAL HIGH (ref 65–99)
Glucose-Capillary: 146 mg/dL — ABNORMAL HIGH (ref 65–99)
Glucose-Capillary: 190 mg/dL — ABNORMAL HIGH (ref 65–99)
Glucose-Capillary: 187 mg/dL — ABNORMAL HIGH (ref 65–99)

## 2016-05-23 LAB — CULTURE, BODY FLUID-BOTTLE

## 2016-05-23 NOTE — Progress Notes (Signed)
[facility] for [facility]ious [facility]   Reason for visit: Follow up on fever  Interval History: feels better, WBC wnl, culture with multiple bacteria, Candida albicans.  Still weak.    Physical Exam: Constitutional:  Vitals:   05/22/16 2124 05/23/16 0500  BP: (!) 176/55 139/60  Pulse: (!) 56 (!) 51  Resp: 20   Temp: 98.4 F (36.9 C) 98.3 F (36.8 C)   patient appears in NAD Respiratory: Normal respiratory effort; CTA B Cardiovascular: RRR GI: soft, nt, nd  Review of Systems: Constitutional: negative for fevers and chills Gastrointestinal: negative for diarrhea Integument/breast: negative for rash  Lab Results  Component Value Date   WBC 7.8 05/23/2016   HGB 8.6 (L) 05/23/2016   HCT 27.2 (L) 05/23/2016   MCV 86.1 05/23/2016   PLT 324 05/23/2016    Lab Results  Component Value Date   CREATININE 0.68 05/23/2016   BUN 8 05/23/2016   NA 138 05/23/2016   K 4.0 05/23/2016   CL 103 05/23/2016   CO2 27 05/23/2016    Lab Results  Component Value Date   ALT 14 05/22/2016   AST 22 05/22/2016   ALKPHOS 330 (H) 05/22/2016     Microbiology: Recent Results (from the past 240 hour(s))  Culture, Blood     Status: None (Preliminary result)   Collection Time: 05/19/16  3:17 PM  Result Value Ref Range Status   BLOOD CULTURE, ROUTINE Preliminary report  Preliminary   RESULT 1 Comment  Preliminary    Comment: No growth in 36 - 48 hours. Received aerobic bottle only.   Culture, Blood     Status: None (Preliminary result)   Collection Time: 05/19/16  3:18 PM  Result Value Ref Range Status   BLOOD CULTURE, ROUTINE Preliminary report  Preliminary   RESULT 1 Comment  Preliminary    Comment: No growth in 36 - 48 hours. Received aerobic bottle only.   Culture, body fluid-bottle     Status: Abnormal   Collection Time: 05/20/16  1:50 PM  Result Value Ref Range Status   Specimen Description BILE  Final   Special Requests NONE  Final   Gram Stain   Final    YEAST IN  BOTH AEROBIC AND ANAEROBIC BOTTLES CRITICAL RESULT CALLED TO, READ BACK BY AND VERIFIED WITH: Precious Haws @0558  05/21/16 MKELLY,MLT Performed at [facility], [address] [place], [place] [address]    Culture CANDIDA ALBICANS (A)  Final   Report Status 05/23/2016 FINAL  Final  Gram stain     Status: None (Preliminary result)   Collection Time: 05/20/16  1:50 PM  Result Value Ref Range Status   Specimen Description BILE  Final   Special Requests NONE  Final   Gram Stain   Final    NO WBC SEEN NO ORGANISMS SEEN Performed at [facility], [place] [address] [place], [place] [address]    Report Status PENDING  Incomplete  Culture, blood (routine x 2)     Status: None (Preliminary result)   Collection Time: 05/20/16  5:11 PM  Result Value Ref Range Status   Specimen Description BLOOD LEFT ANTECUBITAL  Final   Special Requests BOTTLES DRAWN AEROBIC AND ANAEROBIC 6CC  Final   Culture   Final    NO GROWTH 3 DAYS Performed at [facility], [place] [address], [place], [place] [address]    Report Status PENDING  Incomplete  Culture, blood (routine x 2)  Status: None (Preliminary result)   Collection Time: 05/20/16  5:18 PM  Result Value Ref Range Status   Specimen Description BLOOD LEFT HAND  Final   Special Requests BOTTLES DRAWN AEROBIC ONLY 5.5CC  Final   Culture   Final    NO GROWTH 3 DAYS Performed at [facility], [address], [place], [place] [address]    Report Status PENDING  Incomplete    Impression/Plan:  1. postprocedure cholangitis - improved and on appropriate antibiotics.  Continue for now.   2. Biliary drain - bilirubin 1.2 yesterday, improved.

## 2016-05-23 NOTE — Progress Notes (Signed)
qPhysical Therapy Treatment Patient Details Name: Melissa Myers MRN: [id number] DOB: October 28, 1945 Today's Date: 05/23/2016    History of Present Illness Pt admitted through ED from [facility] re[facility] 2* fever, lethargy, confusion and delirium.  Pt s/p bilary stent placement 05/19/16 and with hx of DM, CHF, neuropathy and pancreatic CA    PT Comments    Pt assisted with ambulating in hallway and fatigues quickly.  Pt reports likely d/c back to SNF today.   Follow Up Recommendations  SNF;Supervision/Assistance - 24 hour     Equipment Recommendations  None recommended by PT    Recommendations for Other Services       Precautions / Restrictions Precautions Precautions: Fall Precaution Comments: R drain    Mobility  Bed Mobility Overal bed mobility: Needs Assistance Bed Mobility: Supine to Sit     Supine to sit: Min guard;HOB elevated Sit to supine: Min guard   General bed mobility comments: heavy use of elevated HOB and rails to self assist  Transfers Overall transfer level: Needs assistance Equipment used: Rolling walker (2 wheeled) Transfers: Sit to/from Stand Sit to Stand: Min guard         General transfer comment: verbal cues for hand placement and backing up to bed prior to sitting  Ambulation/Gait Ambulation/Gait assistance: Min guard Ambulation Distance (Feet): 60 Feet Assistive device: Rolling walker (2 wheeled) Gait Pattern/deviations: Step-through pattern;Decreased stride length;Trunk flexed Gait velocity: decr   General Gait Details: verbal cues for posture, position from RW, followed with recliner however pt did not require   Stairs            Wheelchair Mobility    Modified Rankin (Stroke Patients Only)       Balance                                            Cognition Arousal/Alertness: Awake/alert Behavior During Therapy: WFL for tasks assessed/performed Overall Cognitive Status: Within Functional Limits  for tasks assessed                                        Exercises      General Comments        Pertinent Vitals/Pain Pain Assessment: Faces Faces Pain Scale: Hurts little more Pain Location: abdomen Pain Descriptors / Indicators: Guarding;Grimacing Pain Intervention(s): Limited activity within patient's tolerance;Monitored during session;Repositioned    Home Living                      Prior Function            PT Goals (current goals can now be found in the care plan section) Progress towards PT goals: Progressing toward goals    Frequency    Min 3X/week      PT Plan Current plan remains appropriate    Co-evaluation             End of Session   Activity Tolerance: Patient tolerated treatment well Patient left: with call bell/phone within reach;in bed;with bed alarm set   PT Visit Diagnosis: Muscle weakness (generalized) (M62.81)     Time: 6546-5035 PT Time Calculation (min) (ACUTE ONLY): 12 min  Charges:  $Gait Training: 8-22 mins  G CodesCarmelia Bake, PT, DPT 05/23/2016 Pager: [phone]    York Ram E 05/23/2016, 11:49 AM

## 2016-05-23 NOTE — Progress Notes (Signed)
GI Location of Tumor / Histology: Pancreatic cancer  Melissa Myers presented  months ago with symptoms of: jaundice,weakness mild confusion, abdominal pain chronic diarrhea  Biopsies of  (if applicable) revealed: Diagnosis 04/28/2016 Dr. Rudene Anda FINE NEEDLE ASPIRATION, ENDOSCOPIC, PANCREAS UNCINATE (SPECIMEN 1 OF 1 COLLECTED 04/28/16): MALIGNANT CELLS CONSISTENT WITH ADENOCARCINOMA.  Past/Anticipated interventions by surgeon, if any:05/19/16 IR Biliary stents,existing access inc dilation cath exchange Dr. Elicia Lamp Biliary stent placement 05/06/16 Dr. Glendell Docker Gessner,.MD, ERCP  Dr. Owens Loffler, MD 04/28/16  EUS and  bx  Pancreatic mass  Past/Anticipated interventions by medical oncology, if any: Dr. Burr Medico, MD  Note 05/19/16=follow up after radiation completed  Weight changes, if any: no, Stable  Bowel/Bladder complaints, if any: IBS,Chronic diarrhea  Nausea / Vomiting, if any: NO  Pain issues, if any:  NO  Any blood per rectum:   No,  Yes to bleeding from vaginal yesterday , all day stated patient none now,   SAFETY ISSUES: YES  Prior radiation? No  Pacemaker/ICD?  No  Is the patient on methotrexate? NO  Current Complaints/Details:  Married, CHF, cellulitis,home oxygen,OSA,Sob, emphysema,,DM II, no tobacco use, no alcohol or illicit drug use,neuropathy Mother and Father  heart disease,MI, ,Paternal grandmother stomach cancer,Paternal grandfather lung cancer,MI several aunts and an uncle, CVa several aunts,  Allergies: Cymbalta,statins,sulfa antibiotics,levemir(insulindetemir0,Morphine and zinc BP 135/62 (BP Location: Left Arm, Patient Position: Sitting, Cuff Size: Large)   Pulse 65   Temp 98.1 F (36.7 C) (Oral)   Resp 20   Ht 5\' 2"  (1.575 m)   Wt 230 lb (104.3 kg)   SpO2 99%   BMI 42.07 kg/m   Wt Readings from Last 3 Encounters:  06/01/16 230 lb (104.3 kg)  05/19/16 224 lb 13.9 oz (102 kg)  05/13/16 252 lb 10.4 oz (114.6 kg)

## 2016-05-23 NOTE — Progress Notes (Signed)
PROGRESS NOTE    Melissa Myers  RSW:[id number] DOB: [birth year] DOA: 05/19/2016 PCP: [name], MD    Brief Narrative:  71 yo female with pancreatic cancer with biliary obstruction, presented with fever after exchange of percutaneous biliary drain. Suspected transient bacteremia. Started on broad spectrum antibiotics and IV fluids. Biliary culture positive for candida, added IV fluconazole. ID consulted.    Assessment & Plan:   Principal Problem:   Fever Active Problems:   Dyslipidemia   RESTLESS LEGS SYNDROME   Essential hypertension   Chronic diastolic heart failure (HCC)   MDD (major depressive disorder), single episode, severe (HCC)   Diabetic peripheral neuropathy (HCC)   Hyperbilirubinemia   Cholangitis   Malignant neoplasm of head of pancreas ([place])   1. Sepsis related to bilary drain exchange, transient bacteremia/ candidemia. Continue antibiotic therapy with Zosyn and fluconazole, follow on ID recommendations. Patient noted to be confused per her family.   2. Hyperbilirubinemia.  Biliary drain In place, tolerating po well. Drain being flushed.   3. Pancreatic cancer. Holding on therapy until improvement of functional status.   4. Hypertension.  Systolic blood pressure 093, patient on coreg at 25 mg po bid.   5. Chronic and stable diastolic heart failure. Chronic and stable.   6. Urine tract infection. Continue zosyn IV #4.  7. T2DM. Glucose cover and monitoring with iss plus basal insulin glargine at 30 units. Capillary glucose 184, 146, 190.  Patient tolerating po well.    DVT prophylaxis:enoxaparin  Code Status:full  Family Communication:no family at the bedside Disposition Plan:home   Consultants:   IR  ID  Procedures:   Biliary drain exchamge  Antimicrobials:   Zosyn  Fluconazole    Subjective: Patient with abdominal pain on the right upper quadrant, no nausea or vomiting, tolerating po well, drain is  been flushed.   Objective: Vitals:   05/22/16 0900 05/22/16 1524 05/22/16 2124 05/23/16 0500  BP: (!) 125/50 (!) 157/50 (!) 176/55 139/60  Pulse:  (!) 51 (!) 56 (!) 51  Resp:  20 20   Temp:  98.3 F (36.8 C) 98.4 F (36.9 C) 98.3 F (36.8 C)  TempSrc:  Oral Oral Oral  SpO2:  96% 96% 95%  Weight:      Height:        Intake/Output Summary (Last 24 hours) at 05/23/16 1454 Last data filed at 05/23/16 0558  Gross per 24 hour  Intake             1540 ml  Output             2301 ml  Net             -761 ml   Filed Weights   05/19/16 1750  Weight: 102 kg (224 lb 13.9 oz)    Examination:  General exam: deconditioned E ENT: mild pallor, oral mucosa dry.  Respiratory system: Respiratory effort normal. Decreased breath sounds at bases. Cardiovascular system: S1 & S2 heard, RRR. No JVD, murmurs, rubs, gallops or clicks. No pedal edema. Gastrointestinal system: Abdomen mildly distended, tender to palpation on the right upper quadrant and soft. No organomegaly or masses felt. Normal bowel sounds heard. Central nervous system: Alert and oriented. No focal neurological deficits. Extremities: Symmetric 5 x 5 power. Skin: No rashes, lesions or ulcers    Data Reviewed: I have personally reviewed following labs and imaging studies  CBC:  Recent Labs Lab 05/19/16 1906 05/20/16 0550 05/21/16 8182 05/22/16 9937 05/23/16 1696  WBC 13.9* 14.1* 10.7* 8.9 7.8  NEUTROABS 11.3* 11.2* 7.6 5.7 4.6  HGB 11.5* 10.5* 8.7* 8.6* 8.6*  HCT 35.0* 32.6* 27.2* 27.3* 27.2*  MCV 87.1 88.1 88.9 89.5 86.1  PLT 385 360 297 308 151   Basic Metabolic Panel:  Recent Labs Lab 05/19/16 1906 05/19/16 1920 05/20/16 0550 05/21/16 0525 05/22/16 0525 05/23/16 0511  NA 136  --  138 135 139 138  K 3.4*  --  3.7 3.2* 4.0 4.0  CL 93*  --  98* 99* 104 103  CO2 31  --  29 28 27 27   GLUCOSE 246*  --  232* 247* 161* 142*  BUN 25*  --  23* 16 10 8   CREATININE 1.13*  --  1.05* 0.76 0.69 0.68  CALCIUM  8.2*  --  7.9* 8.1* 8.7* 8.9  MG 1.2*  --   --   --   --   --   PHOS  --  3.7  --   --   --   --    GFR: Estimated Creatinine Clearance: 73.2 mL/min (by C-G formula based on SCr of 0.68 mg/dL). Liver Function Tests:  Recent Labs Lab 05/19/16 0726 05/19/16 1906 05/20/16 0550 05/22/16 0525  AST 31 37 30 22  ALT 19 21 19 14   ALKPHOS 458* 436* 384* 330*  BILITOT 2.2* 2.0* 1.9* 1.2  PROT 7.5 7.0 6.6 6.1*  ALBUMIN 2.9* 2.7* 2.4* 2.1*   No results for input(s): LIPASE, AMYLASE in the last 168 hours. No results for input(s): AMMONIA in the last 168 hours. Coagulation Profile:  Recent Labs Lab 05/19/16 0726 05/20/16 0550  INR 0.97 1.17   Cardiac Enzymes: No results for input(s): CKTOTAL, CKMB, CKMBINDEX, TROPONINI in the last 168 hours. BNP (last 3 results)  Recent Labs  11/04/15 0956  PROBNP 20.0   HbA1C: No results for input(s): HGBA1C in the last 72 hours. CBG:  Recent Labs Lab 05/22/16 1151 05/22/16 1607 05/22/16 2120 05/23/16 0747 05/23/16 1158  GLUCAP 198* 236* 184* 146* 190*   Lipid Profile: No results for input(s): CHOL, HDL, LDLCALC, TRIG, CHOLHDL, LDLDIRECT in the last 72 hours. Thyroid Function Tests: No results for input(s): TSH, T4TOTAL, FREET4, T3FREE, THYROIDAB in the last 72 hours. Anemia Panel: No results for input(s): VITAMINB12, FOLATE, FERRITIN, TIBC, IRON, RETICCTPCT in the last 72 hours. Sepsis Labs:  Recent Labs Lab 05/19/16 2002  LATICACIDVEN 1.8    Recent Results (from the past 240 hour(s))  Culture, Blood     Status: None (Preliminary result)   Collection Time: 05/19/16  3:17 PM  Result Value Ref Range Status   BLOOD CULTURE, ROUTINE Preliminary report  Preliminary   RESULT 1 Comment  Preliminary    Comment: No growth in 36 - 48 hours. Received aerobic bottle only.   Culture, Blood     Status: None (Preliminary result)   Collection Time: 05/19/16  3:18 PM  Result Value Ref Range Status   BLOOD CULTURE, ROUTINE Preliminary  report  Preliminary   RESULT 1 Comment  Preliminary    Comment: No growth in 36 - 48 hours. Received aerobic bottle only.   Culture, body fluid-bottle     Status: Abnormal   Collection Time: 05/20/16  1:50 PM  Result Value Ref Range Status   Specimen Description BILE  Final   Special Requests NONE  Final   Gram Stain   Final    YEAST IN BOTH AEROBIC AND ANAEROBIC BOTTLES CRITICAL RESULT CALLED TO, READ BACK BY  AND VERIFIED WITH: Precious Haws @0558  05/21/16 MKELLY,MLT Performed at [facility], [place] [address], [place], [place] [address]    Culture CANDIDA ALBICANS (A)  Final   Report Status 05/23/2016 FINAL  Final  Gram stain     Status: None (Preliminary result)   Collection Time: 05/20/16  1:50 PM  Result Value Ref Range Status   Specimen Description BILE  Final   Special Requests NONE  Final   Gram Stain   Final    NO WBC SEEN NO ORGANISMS SEEN Performed at [facility], [place] [address] [place], [place] [address]    Report Status PENDING  Incomplete  Culture, blood (routine x 2)     Status: None (Preliminary result)   Collection Time: 05/20/16  5:11 PM  Result Value Ref Range Status   Specimen Description BLOOD LEFT ANTECUBITAL  Final   Special Requests BOTTLES DRAWN AEROBIC AND ANAEROBIC 6CC  Final   Culture   Final    NO GROWTH 3 DAYS Performed at [facility], [place] [address] [place], [place] [address]    Report Status PENDING  Incomplete  Culture, blood (routine x 2)     Status: None (Preliminary result)   Collection Time: 05/20/16  5:18 PM  Result Value Ref Range Status   Specimen Description BLOOD LEFT HAND  Final   Special Requests BOTTLES DRAWN AEROBIC ONLY 5.5CC  Final   Culture   Final    NO GROWTH 3 DAYS Performed at [facility], [address], [place], [place] [address]    Report Status PENDING  Incomplete         Radiology Studies: No results found.      Scheduled Meds: . carvedilol  25 mg Oral BID WC  . feeding  supplement (ENSURE ENLIVE)  237 mL Oral TID BM  . fluconazole (DIFLUCAN) IV  400 mg Intravenous Q24H  . heparin  5,000 Units Subcutaneous Q8H  . insulin aspart  0-9 Units Subcutaneous TID WC  . insulin glargine  30 Units Subcutaneous Daily  . lactulose  20 g Oral Daily  . piperacillin-tazobactam (ZOSYN)  IV  3.375 g Intravenous Q8H  . pregabalin  200 mg Oral BID  . rOPINIRole  2 mg Oral BID   Continuous Infusions:   LOS: 3 days       Mauricio Gerome Apley, MD [facility] Pager [phone]  If 7PM-7AM, please contact night-coverage [email or web address] Password [email or web address] 05/23/2016, 2:54 PM

## 2016-05-23 NOTE — Progress Notes (Signed)
Referring Physician(s):  Dr. Silvano Rusk Dr. Truitt Merle  Supervising Physician: Markus Daft  Patient Status:  Melissa Myers - In-pt  Chief Complaint:  Pancreatic cancer with biliary obstruction  Subjective: Feeling better.  Sitting up in bed eating.  Allergies: Cymbalta [duloxetine hcl]; Statins; Sulfa antibiotics; Levemir [insulin detemir]; Morphine; and Zinc  Medications: Prior to Admission medications   Medication Sig Start Date End Date Taking? Authorizing Provider  aspirin EC 81 MG tablet Take 1 tablet (81 mg total) by mouth daily. 07/08/14  Yes Rowe Clack, MD  carvedilol (COREG) 25 MG tablet TAKE 1 TABLET BY MOUTH TWICE DAILY WITH FOOD 01/20/16  Yes Jolaine Artist, MD  Cholecalciferol (VITAMIN D3) 2000 units capsule Take 1 capsule (2,000 Units total) by mouth daily. 06/17/15  Yes Janith Lima, MD  citalopram (CELEXA) 20 MG tablet TAKE 1 TABLET (20 MG TOTAL) BY MOUTH DAILY. 01/27/16  Yes [name], MD  DULoxetine (CYMBALTA) 60 MG capsule Take 1 capsule (60 mg total) by mouth daily. 09/25/15  Yes [name], MD  ezetimibe (ZETIA) 10 MG tablet TAKE ONE TABLET BY MOUTH DAILY 02/03/16  Yes [name], MD  gabapentin (NEURONTIN) 300 MG capsule TAKE TWO CAPSULES BY MOUTH FOUR TIMES A DAY 01/19/16  Yes [name], MD  hydrALAZINE (APRESOLINE) 25 MG tablet TAKE 1 TABLET (25 MG TOTAL) BY MOUTH 3 (THREE) TIMES DAILY. 02/16/16  Yes Jolaine Artist, MD  HYDROcodone-acetaminophen (NORCO) 10-325 MG tablet Take 1 tablet by mouth every 6 (six) hours as needed. Patient taking differently: Take 1 tablet by mouth every 6 (six) hours as needed (pain).  05/13/16  Yes Patrecia Pour, MD  insulin glargine (LANTUS) 100 UNIT/ML injection Inject 0.3 mLs (30 Units total) into the skin daily. 03/22/16  Yes Mariel Aloe, MD  lactulose (CHRONULAC) 10 GM/15ML solution Take 30 mLs (20 g total) by mouth daily. 05/14/16  Yes Patrecia Pour, MD  LYRICA 200 MG capsule TAKE  ONE CAPSULE BY MOUTH TWICE A DAY 04/07/16  Yes [name], MD  metFORMIN (GLUCOPHAGE) 500 MG tablet Take 500 mg by mouth daily with breakfast.   Yes Historical Provider, MD  metolazone (ZAROXOLYN) 2.5 MG tablet Take 1 tablet (2.5 mg total) by mouth 2 (two) times a week. 12/17/15  Yes Satira Mccallum Tillery, PA-C  NON FORMULARY Take 120 mLs by mouth 3 (three) times daily. Sugar-free "MedPass" for supplement.   Yes Historical Provider, MD  rOPINIRole (REQUIP XL) 4 MG 24 hr tablet TAKE 1 TABLET (4 MG TOTAL) BY MOUTH AT BEDTIME. 02/23/16  Yes [name], MD  spironolactone (ALDACTONE) 25 MG tablet TAKE ONE TABLET EACH DAY 04/21/16  Yes Larey Dresser, MD  torsemide (DEMADEX) 100 MG tablet Take 1 tablet (100 mg total) by mouth daily. 03/21/16  Yes Mariel Aloe, MD  vitamin B-12 (CYANOCOBALAMIN) 500 MCG tablet Take 500 mcg by mouth daily. Reported on 09/09/2015   Yes Historical Provider, MD  feeding supplement, ENSURE ENLIVE, (ENSURE ENLIVE) LIQD Take 237 mLs by mouth 3 (three) times daily between meals. 05/13/16   Patrecia Pour, MD  insulin aspart (NOVOLOG) 100 UNIT/ML injection Inject 0-20 Units into the skin 3 (three) times daily with meals. . CBG 70 - 120: 0 units CBG 121 - 150: 3 units CBG 151 - 200: 4 units CBG 201 - 250: 7 units CBG 251 - 300: 11 units CBG 301 - 350: 15 units CBG 351 - 400: 20  units 03/21/16   Mariel Aloe, MD     Vital Signs: BP 139/60 (BP Location: Right Arm)   Pulse (!) 51   Temp 98.3 F (36.8 C) (Oral)   Resp 20   Ht 5\' 2"  (1.575 m)   Wt 224 lb 13.9 oz (102 kg)   SpO2 95%   BMI 41.13 kg/m   Physical Exam  Constitutional: She is oriented to person, place, and time. She appears well-developed.  Abdominal: Soft. There is no tenderness.  Drain in place.  Thin bilious output- 200 mL in bag.  Insertion site c/d/I. Dressing replaced.   Neurological: She is alert and oriented to person, place, and time.  Nursing note and vitals  reviewed.   Imaging: No results found.  Labs:  CBC:  Recent Labs  05/20/16 0550 05/21/16 0525 05/22/16 0525 05/23/16 0511  WBC 14.1* 10.7* 8.9 7.8  HGB 10.5* 8.7* 8.6* 8.6*  HCT 32.6* 27.2* 27.3* 27.2*  PLT 360 297 308 324    COAGS:  Recent Labs  03/15/16 1116 05/05/16 0023 05/19/16 0726 05/20/16 0550  INR 0.94 0.97 0.97 1.17  APTT 30 32 34  --     BMP:  Recent Labs  05/20/16 0550 05/21/16 0525 05/22/16 0525 05/23/16 0511  NA 138 135 139 138  K 3.7 3.2* 4.0 4.0  CL 98* 99* 104 103  CO2 29 28 27 27   GLUCOSE 232* 247* 161* 142*  BUN 23* 16 10 8   CALCIUM 7.9* 8.1* 8.7* 8.9  CREATININE 1.05* 0.76 0.69 0.68  GFRNONAA 53* >60 >60 >60  GFRAA >60 >60 >60 >60    LIVER FUNCTION TESTS:  Recent Labs  05/19/16 0726 05/19/16 1906 05/20/16 0550 05/22/16 0525  BILITOT 2.2* 2.0* 1.9* 1.2  AST 31 37 30 22  ALT 19 21 19 14   ALKPHOS 458* 436* 384* 330*  PROT 7.5 7.0 6.6 6.1*  ALBUMIN 2.9* 2.7* 2.4* 2.1*    Assessment and Plan: 1.  Pancreatic cancer s/p biliary stent placement 3/29 with external biliary drain left in place. Drain remains to external drainage.  She has not attempted capping trial.  TBili improved to 1.2  2.  Cholangitis Continues Zosyn and fluconazole. Remains afebrile.  Continue with routine drain care.  Continue plan per ID and medical team.  Electronically Signed: Docia Barrier 05/23/2016, 4:28 PM   I spent a total of 15 Minutes at the the patient's bedside AND on the patient's hospital floor or unit, greater than 50% of which was counseling/coordinating care for cholangitis

## 2016-05-24 DIAGNOSIS — Z5181 Encounter for therapeutic drug level monitoring: Secondary | ICD-10-CM

## 2016-05-24 DIAGNOSIS — R100 Acute abdomen: Secondary | ICD-10-CM | POA: Diagnosis not present

## 2016-05-24 DIAGNOSIS — M6281 Muscle weakness (generalized): Secondary | ICD-10-CM | POA: Diagnosis not present

## 2016-05-24 DIAGNOSIS — I5032 Chronic diastolic (congestive) heart failure: Secondary | ICD-10-CM | POA: Diagnosis not present

## 2016-05-24 DIAGNOSIS — E1142 Type 2 diabetes mellitus with diabetic polyneuropathy: Secondary | ICD-10-CM | POA: Diagnosis not present

## 2016-05-24 DIAGNOSIS — G4730 Sleep apnea, unspecified: Secondary | ICD-10-CM | POA: Diagnosis not present

## 2016-05-24 DIAGNOSIS — G894 Chronic pain syndrome: Secondary | ICD-10-CM | POA: Diagnosis not present

## 2016-05-24 DIAGNOSIS — I10 Essential (primary) hypertension: Secondary | ICD-10-CM | POA: Diagnosis not present

## 2016-05-24 DIAGNOSIS — R41841 Cognitive communication deficit: Secondary | ICD-10-CM | POA: Diagnosis not present

## 2016-05-24 DIAGNOSIS — C801 Malignant (primary) neoplasm, unspecified: Secondary | ICD-10-CM | POA: Diagnosis not present

## 2016-05-24 DIAGNOSIS — K830 Cholangitis: Secondary | ICD-10-CM | POA: Diagnosis not present

## 2016-05-24 DIAGNOSIS — F339 Major depressive disorder, recurrent, unspecified: Secondary | ICD-10-CM | POA: Diagnosis not present

## 2016-05-24 DIAGNOSIS — E785 Hyperlipidemia, unspecified: Secondary | ICD-10-CM | POA: Diagnosis not present

## 2016-05-24 DIAGNOSIS — Z9689 Presence of other specified functional implants: Secondary | ICD-10-CM | POA: Diagnosis not present

## 2016-05-24 DIAGNOSIS — R5082 Postprocedural fever: Secondary | ICD-10-CM | POA: Diagnosis not present

## 2016-05-24 DIAGNOSIS — A419 Sepsis, unspecified organism: Secondary | ICD-10-CM | POA: Diagnosis not present

## 2016-05-24 DIAGNOSIS — K838 Other specified diseases of biliary tract: Secondary | ICD-10-CM | POA: Diagnosis not present

## 2016-05-24 DIAGNOSIS — R278 Other lack of coordination: Secondary | ICD-10-CM | POA: Diagnosis not present

## 2016-05-24 DIAGNOSIS — C250 Malignant neoplasm of head of pancreas: Secondary | ICD-10-CM | POA: Diagnosis not present

## 2016-05-24 DIAGNOSIS — N390 Urinary tract infection, site not specified: Secondary | ICD-10-CM | POA: Diagnosis not present

## 2016-05-24 LAB — CBC WITH DIFFERENTIAL/PLATELET
BASOS ABS: 0 10*3/uL (ref 0.0–0.1)
Basophils Relative: 1 %
EOS ABS: 0.2 10*3/uL (ref 0.0–0.7)
Eosinophils Relative: 4 %
HCT: 29 % — ABNORMAL LOW (ref 36.0–46.0)
Hemoglobin: 9.1 g/dL — ABNORMAL LOW (ref 12.0–15.0)
LYMPHS ABS: 2.6 10*3/uL (ref 0.7–4.0)
LYMPHS PCT: 38 %
MCH: 27.6 pg (ref 26.0–34.0)
MCHC: 31.4 g/dL (ref 30.0–36.0)
MCV: 87.9 fL (ref 78.0–100.0)
MONOS PCT: 9 %
Monocytes Absolute: 0.6 10*3/uL (ref 0.1–1.0)
NEUTROS PCT: 48 %
Neutro Abs: 3.4 10*3/uL (ref 1.7–7.7)
PLATELETS: 320 10*3/uL (ref 150–400)
RBC: 3.3 MIL/uL — ABNORMAL LOW (ref 3.87–5.11)
RDW: 15.4 % (ref 11.5–15.5)
WBC: 6.9 10*3/uL (ref 4.0–10.5)

## 2016-05-24 LAB — GLUCOSE, CAPILLARY
GLUCOSE-CAPILLARY: 124 mg/dL — AB (ref 65–99)
GLUCOSE-CAPILLARY: 196 mg/dL — AB (ref 65–99)
Glucose-Capillary: 80 mg/dL (ref 65–99)

## 2016-05-24 MED ORDER — POTASSIUM CHLORIDE ER 20 MEQ PO TBCR: 20.0000 meq | EXTENDED_RELEASE_TABLET | Freq: Every day | ORAL | 0 refills | Status: DC | PRN

## 2016-05-24 MED ORDER — FUROSEMIDE 40 MG PO TABS: 40.0000 mg | ORAL_TABLET | Freq: Every day | ORAL | 0 refills | Status: DC | PRN

## 2016-05-24 MED ORDER — HYDROCODONE-ACETAMINOPHEN 10-325 MG PO TABS: 1.0000 | ORAL_TABLET | Freq: Four times a day (QID) | ORAL | 0 refills | Status: DC | PRN

## 2016-05-24 NOTE — Progress Notes (Signed)
[place] for Infectious Disease   Reason for visit: Follow up on fever  Interval History: feels better.  No further fever.  WBC remains wnl.     Physical Exam: Constitutional:  Vitals:   05/23/16 2105 05/24/16 0612  BP: (!) 152/60 (!) 145/54  Pulse: (!) 47 (!) 42  Resp: 18 18  Temp: 97.6 F (36.4 C) 97.6 F (36.4 C)   patient appears in NAD Respiratory: Normal respiratory effort; CTA B Cardiovascular: RRR GI: soft, nt, nd  Review of Systems: Constitutional: negative for fevers and chills Gastrointestinal: negative for diarrhea Integument/breast: negative for rash  Lab Results  Component Value Date   WBC 6.9 05/24/2016   HGB 9.1 (L) 05/24/2016   HCT 29.0 (L) 05/24/2016   MCV 87.9 05/24/2016   PLT 320 05/24/2016    Lab Results  Component Value Date   CREATININE 0.68 05/23/2016   BUN 8 05/23/2016   NA 138 05/23/2016   K 4.0 05/23/2016   CL 103 05/23/2016   CO2 27 05/23/2016    Lab Results  Component Value Date   ALT 14 05/22/2016   AST 22 05/22/2016   ALKPHOS 330 (H) 05/22/2016     Microbiology: Recent Results (from the past 240 hour(s))  Culture, Blood     Status: None (Preliminary result)   Collection Time: 05/19/16  3:17 PM  Result Value Ref Range Status   BLOOD CULTURE, ROUTINE Preliminary report  Preliminary   RESULT 1 Comment  Preliminary    Comment: No growth in 36 - 48 hours. Received aerobic bottle only.   Culture, Blood     Status: None (Preliminary result)   Collection Time: 05/19/16  3:18 PM  Result Value Ref Range Status   BLOOD CULTURE, ROUTINE Preliminary report  Preliminary   RESULT 1 Comment  Preliminary    Comment: No growth in 36 - 48 hours. Received aerobic bottle only.   Culture, body fluid-bottle     Status: Abnormal   Collection Time: 05/20/16  1:50 PM  Result Value Ref Range Status   Specimen Description BILE  Final   Special Requests NONE  Final   Gram Stain   Final    YEAST IN BOTH AEROBIC AND ANAEROBIC  BOTTLES CRITICAL RESULT CALLED TO, READ BACK BY AND VERIFIED WITH: Precious Haws @0558  05/21/16 MKELLY,MLT Performed at [facility], [address], [place], [place] [address]    Culture CANDIDA ALBICANS (A)  Final   Report Status 05/23/2016 FINAL  Final  Gram stain     Status: None (Preliminary result)   Collection Time: 05/20/16  1:50 PM  Result Value Ref Range Status   Specimen Description BILE  Final   Special Requests NONE  Final   Gram Stain   Final    NO WBC SEEN NO ORGANISMS SEEN Performed at [facility], [place] [address], [place], [place] [address]    Report Status PENDING  Incomplete  Culture, blood (routine x 2)     Status: None (Preliminary result)   Collection Time: 05/20/16  5:11 PM  Result Value Ref Range Status   Specimen Description BLOOD LEFT ANTECUBITAL  Final   Special Requests BOTTLES DRAWN AEROBIC AND ANAEROBIC 6CC  Final   Culture   Final    NO GROWTH 3 DAYS Performed at [facility], [place] [address], [place], [place] [address]    Report Status PENDING  Incomplete  Culture, blood (routine x 2)  Status: None (Preliminary result)   Collection Time: 05/20/16  5:18 PM  Result Value Ref Range Status   Specimen Description BLOOD LEFT HAND  Final   Special Requests BOTTLES DRAWN AEROBIC ONLY 5.5CC  Final   Culture   Final    NO GROWTH 3 DAYS Performed at [facility], [address] [place], [place] [address]    Report Status PENDING  Incomplete    Impression/Plan:  1. postprocedure cholangitis - improved and on appropriate antibiotics. Can continue with fluconazole and zosyn while here and can d/c off of antibiotics at discharge.    2. Medication monitoring - creat stable on antibiotics.  Last checked yesterday.   I will sign off, please call with questions.

## 2016-05-24 NOTE — NC FL2 (Signed)
[place] MEDICAID FL2 LEVEL OF CARE SCREENING TOOL     IDENTIFICATION  Patient Name: Melissa Myers Birthdate: 1945/10/23 Sex: female Admission Date (Current Location): 05/19/2016  [facility] [place] Number:  Herbalist and Address:  [facility],  [place], [place]      Provider Number: [id number]  Attending Physician Name and Address:  [name], *  Relative Name and Phone Number:       Current Level of Care: Hospital Recommended Level of Care: [place] Prior Approval Number:    Date Approved/Denied:   PASRR Number: [id number]  Discharge Plan: SNF    Current Diagnoses: Patient Active Problem List   Diagnosis Date Noted  . Fever 05/19/2016  . Goals of care, counseling/discussion   . Palliative care by specialist   . Malignant neoplasm of head of pancreas ([place])   . Cholangitis 05/05/2016  . Common bile duct stricture   . Obstructive jaundice   . Hyperbilirubinemia 03/15/2016  . Diabetic peripheral neuropathy ([place]) 02/23/2016  . Murmur, cardiac 07/02/2015  . Chronic pain syndrome 05/05/2015  . MDD (major depressive disorder), single episode, severe ([place]) 05/05/2015  . Chronic anemia 04/14/2015  . IDDM (insulin dependent diabetes mellitus) ([place]) 03/19/2015  . Hyperlipidemia 12/23/2014  . Venous stasis ulcers of both lower extremities ([place]) 01/04/2011  . Chronic diastolic heart failure ([place]) 06/01/2009  . Vitamin D deficiency 05/12/2009  . Sleep apnea 04/29/2009  . Dyslipidemia 04/20/2009  . Morbid obesity ([place]) 04/20/2009  . RESTLESS LEGS SYNDROME 04/20/2009  . Peripheral neuropathy ([place]) 04/20/2009  . Essential hypertension 04/20/2009    Orientation RESPIRATION BLADDER Height & Weight     Self, Time, Place  Normal Continent Weight: 224 lb 13.9 oz (102 kg) Height:  5\' 2"  (157.5 cm)  BEHAVIORAL SYMPTOMS/MOOD NEUROLOGICAL BOWEL NUTRITION STATUS      Continent Diet (heart  healthy/carb modified)  AMBULATORY STATUS COMMUNICATION OF NEEDS Skin   Extensive Assist Verbally Normal                       Personal Care Assistance Level of Assistance  Bathing, Dressing Bathing Assistance: Maximum assistance   Dressing Assistance: Maximum assistance     Functional Limitations Info             SPECIAL CARE FACTORS FREQUENCY  PT (By licensed PT), OT (By licensed OT)     PT Frequency: 5x/week OT Frequency: 5x/week            Contractures Contractures Info: Not present    Additional Factors Info  Code Status, Allergies Code Status Info: Full Allergies Info: Cymbalta Duloxetine Hcl, Statins, Sulfa Antibiotics, Levemir Insulin Detemir, Morphine, Zinc           Current Medications (05/24/2016):  This is the current hospital active medication list Current Facility-Administered Medications  Medication Dose Route Frequency Provider Last Rate Last Dose  . acetaminophen (TYLENOL) tablet 650 mg  650 mg Oral Q6H PRN [name], MD   650 mg at 05/22/16 2050  . feeding supplement (ENSURE ENLIVE) (ENSURE ENLIVE) liquid 237 mL  237 mL Oral TID BM [name], MD   237 mL at 05/24/16 1528  . fluconazole (DIFLUCAN) IVPB 400 mg  400 mg Intravenous Q24H [name], MD   400 mg at 05/24/16 1029  . heparin injection 5,000 Units  5,000 Units Subcutaneous Q8H [name], MD   5,000 Units at 05/24/16 1529  .  ibuprofen (ADVIL,MOTRIN) tablet 400 mg  400 mg Oral Q6H PRN [name], MD   400 mg at 05/24/16 1211  . insulin aspart (novoLOG) injection 0-9 Units  0-9 Units Subcutaneous TID WC [name], MD   1 Units at 05/24/16 1210  . insulin glargine (LANTUS) injection 30 Units  30 Units Subcutaneous Daily [name], MD   30 Units at 05/24/16 1030  . lactulose (CHRONULAC) 10 GM/15ML solution 20 g  20 g Oral Daily [name], MD   20 g at 05/24/16 1030  . ondansetron (ZOFRAN) tablet 4 mg  4 mg Oral  Q6H PRN [name], MD       Or  . ondansetron [facility]) injection 4 mg  4 mg Intravenous Q6H PRN [name], MD      . piperacillin-tazobactam (ZOSYN) IVPB 3.375 g  3.375 g Intravenous Q8H Adrian Saran, RPH   3.375 g at 05/24/16 0514  . pregabalin (LYRICA) capsule 200 mg  200 mg Oral BID [name], MD   200 mg at 05/24/16 1031  . rOPINIRole (REQUIP) tablet 2 mg  2 mg Oral BID [name], MD   2 mg at 05/24/16 1031     Discharge Medications: Please see discharge summary for a list of discharge medications.  Relevant Imaging Results:  Relevant Lab Results:   Additional Information SS#: [id number]  Burnis Medin, LCSW

## 2016-05-24 NOTE — Progress Notes (Signed)
Referring Physician(s):  Dr. Silvano Rusk Dr. Truitt Merle  Supervising Physician: Markus Daft  Patient Status:  Melissa [facility] - In-pt  Chief Complaint:  Pancreatic cancer with biliary obstruction  Subjective: Continues to feel well.  No complaints.  Allergies: Cymbalta [duloxetine hcl]; Statins; Sulfa antibiotics; Levemir [insulin detemir]; Morphine; and Zinc  Medications: Prior to Admission medications   Medication Sig Start Date End Date Taking? Authorizing Provider  aspirin EC 81 MG tablet Take 1 tablet (81 mg total) by mouth daily. 07/08/14  Yes Rowe Clack, MD  carvedilol (COREG) 25 MG tablet TAKE 1 TABLET BY MOUTH TWICE DAILY WITH FOOD 01/20/16  Yes Jolaine Artist, MD  Cholecalciferol (VITAMIN D3) 2000 units capsule Take 1 capsule (2,000 Units total) by mouth daily. 06/17/15  Yes Janith Lima, MD  citalopram (CELEXA) 20 MG tablet TAKE 1 TABLET (20 MG TOTAL) BY MOUTH DAILY. 01/27/16  Yes [name], MD  DULoxetine (CYMBALTA) 60 MG capsule Take 1 capsule (60 mg total) by mouth daily. 09/25/15  Yes [name], MD  ezetimibe (ZETIA) 10 MG tablet TAKE ONE TABLET BY MOUTH DAILY 02/03/16  Yes [name], MD  gabapentin (NEURONTIN) 300 MG capsule TAKE TWO CAPSULES BY MOUTH FOUR TIMES A DAY 01/19/16  Yes [name], MD  hydrALAZINE (APRESOLINE) 25 MG tablet TAKE 1 TABLET (25 MG TOTAL) BY MOUTH 3 (THREE) TIMES DAILY. 02/16/16  Yes Jolaine Artist, MD  HYDROcodone-acetaminophen (NORCO) 10-325 MG tablet Take 1 tablet by mouth every 6 (six) hours as needed. Patient taking differently: Take 1 tablet by mouth every 6 (six) hours as needed (pain).  05/13/16  Yes Patrecia Pour, MD  insulin glargine (LANTUS) 100 UNIT/ML injection Inject 0.3 mLs (30 Units total) into the skin daily. 03/22/16  Yes Mariel Aloe, MD  lactulose (CHRONULAC) 10 GM/15ML solution Take 30 mLs (20 g total) by mouth daily. 05/14/16  Yes Patrecia Pour, MD  LYRICA 200 MG capsule TAKE ONE  CAPSULE BY MOUTH TWICE A DAY 04/07/16  Yes [name], MD  metFORMIN (GLUCOPHAGE) 500 MG tablet Take 500 mg by mouth daily with breakfast.   Yes Historical Provider, MD  metolazone (ZAROXOLYN) 2.5 MG tablet Take 1 tablet (2.5 mg total) by mouth 2 (two) times a week. 12/17/15  Yes Satira Mccallum Tillery, PA-C  NON FORMULARY Take 120 mLs by mouth 3 (three) times daily. Sugar-free "MedPass" for supplement.   Yes Historical Provider, MD  rOPINIRole (REQUIP XL) 4 MG 24 hr tablet TAKE 1 TABLET (4 MG TOTAL) BY MOUTH AT BEDTIME. 02/23/16  Yes [name], MD  spironolactone (ALDACTONE) 25 MG tablet TAKE ONE TABLET EACH DAY 04/21/16  Yes Larey Dresser, MD  torsemide (DEMADEX) 100 MG tablet Take 1 tablet (100 mg total) by mouth daily. 03/21/16  Yes Mariel Aloe, MD  vitamin B-12 (CYANOCOBALAMIN) 500 MCG tablet Take 500 mcg by mouth daily. Reported on 09/09/2015   Yes Historical Provider, MD  feeding supplement, ENSURE ENLIVE, (ENSURE ENLIVE) LIQD Take 237 mLs by mouth 3 (three) times daily between meals. 05/13/16   Patrecia Pour, MD  insulin aspart (NOVOLOG) 100 UNIT/ML injection Inject 0-20 Units into the skin 3 (three) times daily with meals. . CBG 70 - 120: 0 units CBG 121 - 150: 3 units CBG 151 - 200: 4 units CBG 201 - 250: 7 units CBG 251 - 300: 11 units CBG 301 - 350: 15 units CBG 351 - 400: 20 units  03/21/16   Mariel Aloe, MD     Vital Signs: BP (!) 142/70 (BP Location: Left Arm)   Pulse (!) 48   Temp 98.3 F (36.8 C) (Oral)   Resp 18   Ht 5\' 2"  (1.575 m)   Wt 224 lb 13.9 oz (102 kg)   SpO2 95%   BMI 41.13 kg/m   Physical Exam  Constitutional: She is oriented to person, place, and time. She appears well-developed.  Abdominal: Soft. There is no tenderness.  Drain in place.  100 mL thin bilious output in bag. Insertion site c/d/I.   Neurological: She is alert and oriented to person, place, and time.  Nursing note and vitals reviewed.   Imaging: No results  found.  Labs:  CBC:  Recent Labs  05/21/16 0525 05/22/16 0525 05/23/16 0511 05/24/16 0509  WBC 10.7* 8.9 7.8 6.9  HGB 8.7* 8.6* 8.6* 9.1*  HCT 27.2* 27.3* 27.2* 29.0*  PLT 297 308 324 320    COAGS:  Recent Labs  03/15/16 1116 05/05/16 0023 05/19/16 0726 05/20/16 0550  INR 0.94 0.97 0.97 1.17  APTT 30 32 34  --     BMP:  Recent Labs  05/20/16 0550 05/21/16 0525 05/22/16 0525 05/23/16 0511  NA 138 135 139 138  K 3.7 3.2* 4.0 4.0  CL 98* 99* 104 103  CO2 29 28 27 27   GLUCOSE 232* 247* 161* 142*  BUN 23* 16 10 8   CALCIUM 7.9* 8.1* 8.7* 8.9  CREATININE 1.05* 0.76 0.69 0.68  GFRNONAA 53* >60 >60 >60  GFRAA >60 >60 >60 >60    LIVER FUNCTION TESTS:  Recent Labs  05/19/16 0726 05/19/16 1906 05/20/16 0550 05/22/16 0525  BILITOT 2.2* 2.0* 1.9* 1.2  AST 31 37 30 22  ALT 19 21 19 14   ALKPHOS 458* 436* 384* 330*  PROT 7.5 7.0 6.6 6.1*  ALBUMIN 2.9* 2.7* 2.4* 2.1*    Assessment and Plan: 1.  Pancreatic cancer s/p biliary stent placement 3/29 with external biliary drain left in place. Drain remains to external drainage.  She has not attempted capping trial.  Spoke with Dr. Annamaria Boots, may send with cap for capping trials at SNF.  2.  Cholangitis Continues Zosyn and fluconazole. Remains afebrile.  Continue with routine drain care.  Plan to see patient back in IR clinic in 4 weeks.   Schedulers will contact her with appointment time and date.   Electronically Signed: Docia Barrier 05/24/2016, 3:56 PM   I spent a total of 15 Minutes at the the patient's bedside AND on the patient's [facility] floor or unit, greater than 50% of which was counseling/coordinating care for cholangitis

## 2016-05-24 NOTE — Consult Note (Signed)
   [facility] CM Inpatient Consult   05/24/2016  ANONA GIOVANNINI 07/10/1945 [id number]    Patient screened for [facility] program. Chart reviewed. Noted likely discharge back to [facility]. Also noted that Mrs.Brendel was active with [facility] (home based palliative medicine program) in the past. Call made to [facility] at [phone], spoke with [facility], to confirm that referral can be made back to them once patient transitions back to home from SNF. Will make inpatient RNCM aware.    Marthenia Rolling, MSN-Ed, RN,BSN [facility] [address] Liaison [phone]

## 2016-05-24 NOTE — Progress Notes (Signed)
CSW received patient's insurance authorization number [id number]. Patient has 6 days there prior at facility. CSW informed facility.   Patient's RN can call report to [phone], patient going to room 3246. PTAR contacted, patient and patient's husband aware.   Abundio Miu, [place] Social Worker [facility] Cell#: [phone]

## 2016-05-24 NOTE — Progress Notes (Signed)
Patient assessed 05/13/2016. Patient's husband confirmed patient's return to [facility]. CSW contacted [facility] and confirmed patient's ability to return. CSW started insurance authorization process, awaiting insurance authorization.   Abundio Miu, [place] Social Worker [facility] Cell#: [phone]

## 2016-05-24 NOTE — Progress Notes (Signed)
Pharmacy Antibiotic Note  Melissa Myers is a 71 y.o. female with hx pancreatic cancer with biliary obstruction and recent perc transhepatic biliary drain which had grown out enterococcus and Pantoea on 3/17--> s/p biliary drain exchanged on 3/29.  She was admitted on 05/19/2016 with fevers and delirium. Vancomycin and zosyn were started on admission, but changed to zosyn and fluconazole for candida albicans noted in bile culture on 3/30.  ID recommends to continue zosyn and fluconazole while inpatient and d/c abx at discharge.  Today, 05/24/2016: - Day #5 zosyn, day D3 fluconazole - AST/ALT and Tbili wnl, alk phos elevated but trending down with last labs on 4/01 - afeb, wbc wnl, scr 0.68 (crcl~73)    Plan: - Continue Zosyn 3.375 g IV given every 8 hrs (infuse over 4 hrs) - Continue fluconazole  400 mg IV daily.  __________________________________   Height: 5' 2"  (157.5 cm) Weight: 224 lb 13.9 oz (102 kg) IBW/kg (Calculated) : 50.1  Temp (24hrs), Avg:97.8 F (36.6 C), Min:97.6 F (36.4 C), Max:98.3 F (36.8 C)   Recent Labs Lab 05/19/16 1906 05/19/16 2002 05/20/16 0550 05/21/16 0525 05/22/16 0525 05/23/16 0511 05/24/16 0509  WBC 13.9*  --  14.1* 10.7* 8.9 7.8 6.9  CREATININE 1.13*  --  1.05* 0.76 0.69 0.68  --   LATICACIDVEN  --  1.8  --   --   --   --   --     Estimated Creatinine Clearance: 73.2 mL/min (by C-G formula based on SCr of 0.68 mg/dL).    Allergies  Allergen Reactions  . Cymbalta [Duloxetine Hcl] Other (See Comments)    Restless leg syndrome  . Statins Other (See Comments)    Statin drugs cause muscle pain / "muscle damage"--was told by MD not to take  . Sulfa Antibiotics Diarrhea  . Levemir [Insulin Detemir] Itching  . Morphine Other (See Comments)    GI upset and headaches  . Zinc Swelling and Rash    Antimicrobials this admission: 3/29 vanc >>3/30 3/29 Zosyn >> 3/31 Diflucan >>  Dose adjustments this admission: --  Microbiology  results: 3/30 BCx x2: ngtd 3/30 bile: yeast, candida albicans FINAL 3/29 bcx x2:   3/17 bili drain cx: pantoea species (pansensitive) and enterococcus ( S- ampicillin and vanc, R to gent synergy - only three things ran)  3/15: UCx Klebsiella ( R- Amp, Unasyn, I - nitrofurantoin S- cefazolin, ceftriaxone, ciprofloxacin, gent, imipenem, Zosyn, Bactrim)     Thank you for allowing pharmacy to be a part of this patient's care.   Dia Sitter, PharmD, BCPS 05/24/2016 11:24 AM

## 2016-05-24 NOTE — Discharge Summary (Addendum)
Physician Discharge Summary  Melissa Myers LOV:[id number] DOB: 30-Nov-1945 DOA: 05/19/2016  PCP: [name], MD  Admit date: 05/19/2016 Discharge date: 05/24/2016  Admitted From: SNF  Disposition:  SNF  Recommendations for Outpatient Follow-up:  1. Follow up with PCP in 1- weeks   Home Health: No  Equipment/Devices: Biliary drain in place   Discharge Condition: stable  CODE STATUS: Full  Diet recommendation: Heart Healthy / Carb Modified  Brief/Interim Summary: This is a 71 yo female who presented to the hospital with the chief complain of fever. Patient underwent percutaneous transhepatic biliary drainage, postprocedure temperature rate 103.2 associated with altered mentation. She had tested in the past positive for enterococcus and Pantoea species on the biliary fluid 03/17. On initial physical examination blood pressure 133/48, heart rate 75, respiratory rate 20, temperature 102.4, oxygen saturation100%, her oral mucosa was dry, her lungs were clear to auscultation bilaterally, heart S1-S2 present rhythmic, abdomen was soft nontender, lower extremity with no edema, patient had a biliary drain in place. Sodium 136, potassium 3.4, chloride 93, bicarbonate 31, glucose 246, BUN 25, creatinine 1.13, AST 37, ALT 21, alkaline phosphatase 436, magnesium 1.2, bilirubin is 2.0, white count 13.9, hemoglobin 11.5, hematocrit 35.0, platelets 385. Urinalysis with too numerous to count white cells.   The patient was admitted to the hospital with the working diagnosis of sepsis suspected to be related to transitory bacteremia.  1. Sepsis related to biliary drainage exchange/ postprocedure cholangitis. Patient was admitted to the medical floor, remote telemetry monitor, intravenous IV fluids and IV antibiotic therapy with vancomycin and Zosyn. Further blood cultures came back negative, vancomycin was discontinued. Biliary drain fluid tested positive for Candida albicans. Patient received IV  fluconazole while in the hospital. Infection disease was consulted, recommendations to stop antibiotics by time of discharge.   Clarification: patient ruled in for sepsis due to suspected transitory bacteremia, related to post procedure (biliary drain exchange), cholangitis.   2. Hyperbilirubinemia. There are function improved, discharge creatinine down to 0.6.  3. Pancreatic cancer. She and will be discharged to a skilled nursing facility, plan to regain physical strength in preparation for cancer therapy.  4. Hypertension. Patient was continued on Coreg with no major complications.  5. Chronic and stable diastolic heart failure. Patient tolerated well  IV fluids. Old records personally reviewed, echocardiogram from October 2017 showed preserved LV systolic function. Cardiology note from March 2, (phone note) recommending to hold on diuretic therapy due to hypotension, systolic blood pressure 88. Will prescribe furosemide as needed to use at the skilled nursing facility. Continue carvedilol. Patient may benefit from ACE inhibitor, if blood pressure tolerates. Will add potassium supplements take along with furosemide.   6. Urinary tract infection. Patient was treated with Zosyn IV.  7. Type 2 diabetes mellitus.  Patient tolerated well by mouth diet, continue glargine and insulin sliding scale, capillary glucose 187, 79, 80, 124.  Late entry: Per IR drain will be cap and will continue dressing changes, follow up in 4 weeks.     Discharge Diagnoses:  Principal Problem:   Fever Active Problems:   Dyslipidemia   RESTLESS LEGS SYNDROME   Essential hypertension   Chronic diastolic heart failure (HCC)   MDD (major depressive disorder), single episode, severe (HCC)   Diabetic peripheral neuropathy (HCC)   Hyperbilirubinemia   Cholangitis   Malignant neoplasm of head of pancreas [facility]    Discharge Instructions   Allergies as of 05/24/2016      Reactions   Cymbalta [duloxetine Hcl]  Other  (See Comments)   Restless leg syndrome   Statins Other (See Comments)   Statin drugs cause muscle pain / "muscle damage"--was told by MD not to take   Sulfa Antibiotics Diarrhea   Levemir [insulin Detemir] Itching   Morphine Other (See Comments)   GI upset and headaches   Zinc Swelling, Rash      Medication List    STOP taking these medications   metolazone 2.5 MG tablet Commonly known as:  ZAROXOLYN   spironolactone 25 MG tablet Commonly known as:  ALDACTONE   torsemide 100 MG tablet Commonly known as:  DEMADEX     TAKE these medications   aspirin EC 81 MG tablet Take 1 tablet (81 mg total) by mouth daily.   carvedilol 25 MG tablet Commonly known as:  COREG TAKE 1 TABLET BY MOUTH TWICE DAILY WITH FOOD   citalopram 20 MG tablet Commonly known as:  CELEXA TAKE 1 TABLET (20 MG TOTAL) BY MOUTH DAILY.   DULoxetine 60 MG capsule Commonly known as:  CYMBALTA Take 1 capsule (60 mg total) by mouth daily.   ezetimibe 10 MG tablet Commonly known as:  ZETIA TAKE ONE TABLET BY MOUTH DAILY   feeding supplement (ENSURE ENLIVE) Liqd Take 237 mLs by mouth 3 (three) times daily between meals.   furosemide 40 MG tablet Commonly known as:  LASIX Take 1 tablet (40 mg total) by mouth daily as needed for fluid or edema.   gabapentin 300 MG capsule Commonly known as:  NEURONTIN TAKE TWO CAPSULES BY MOUTH FOUR TIMES A DAY   hydrALAZINE 25 MG tablet Commonly known as:  APRESOLINE TAKE 1 TABLET (25 MG TOTAL) BY MOUTH 3 (THREE) TIMES DAILY.   HYDROcodone-acetaminophen 10-325 MG tablet Commonly known as:  NORCO Take 1 tablet by mouth every 6 (six) hours as needed (pain).   insulin aspart 100 UNIT/ML injection Commonly known as:  novoLOG Inject 0-20 Units into the skin 3 (three) times daily with meals. . CBG 70 - 120: 0 units CBG 121 - 150: 3 units CBG 151 - 200: 4 units CBG 201 - 250: 7 units CBG 251 - 300: 11 units CBG 301 - 350: 15 units CBG 351 - 400: 20 units   insulin  glargine 100 UNIT/ML injection Commonly known as:  LANTUS Inject 0.3 mLs (30 Units total) into the skin daily.   lactulose 10 GM/15ML solution Commonly known as:  CHRONULAC Take 30 mLs (20 g total) by mouth daily.   LYRICA 200 MG capsule Generic drug:  pregabalin TAKE ONE CAPSULE BY MOUTH TWICE A DAY   metFORMIN 500 MG tablet Commonly known as:  GLUCOPHAGE Take 500 mg by mouth daily with breakfast.   NON FORMULARY Take 120 mLs by mouth 3 (three) times daily. Sugar-free "MedPass" for supplement.   Potassium Chloride ER 20 MEQ Tbcr Take 20 mEq by mouth daily as needed. Take only if taking furosemide.   rOPINIRole 4 MG 24 hr tablet Commonly known as:  REQUIP XL TAKE 1 TABLET (4 MG TOTAL) BY MOUTH AT BEDTIME.   vitamin B-12 500 MCG tablet Commonly known as:  CYANOCOBALAMIN Take 500 mcg by mouth daily. Reported on 09/09/2015   Vitamin D3 2000 units capsule Take 1 capsule (2,000 Units total) by mouth daily.       Allergies  Allergen Reactions  . Cymbalta [Duloxetine Hcl] Other (See Comments)    Restless leg syndrome  . Statins Other (See Comments)    Statin drugs cause muscle pain / "  muscle damage"--was told by MD not to take  . Sulfa Antibiotics Diarrhea  . Levemir [Insulin Detemir] Itching  . Morphine Other (See Comments)    GI upset and headaches  . Zinc Swelling and Rash    Consultations:  Intervention radiology  I.D   Procedures/Studies: Ct Head Wo Contrast  Result Date: 05/05/2016 CLINICAL DATA:  71 year old female with headache and confusion. EXAM: CT HEAD WITHOUT CONTRAST TECHNIQUE: Contiguous axial images were obtained from the base of the skull through the vertex without intravenous contrast. COMPARISON:  Head CT dated 10/22/2014 FINDINGS: Brain: The ventricles and sulci appropriate in size for patient's age. Minimal periventricular and deep white matter chronic microvascular ischemic changes noted. There is no acute intracranial hemorrhage. No mass  effect or midline shift noted. There is no extra-axial fluid collection. Vascular: No hyperdense vessel or unexpected calcification. Skull: Normal. Negative for fracture or focal lesion. Sinuses/Orbits: No acute finding. Other: None. IMPRESSION: No acute intracranial pathology. Electronically Signed   By: Anner Crete M.D.   On: 05/05/2016 01:21   Ct Chest Wo Contrast  Result Date: 05/07/2016 CLINICAL DATA:  Pancreatic mass, recent bile ducts stent placement EXAM: CT CHEST WITHOUT CONTRAST TECHNIQUE: Multidetector CT imaging of the chest was performed following the standard protocol without IV contrast. COMPARISON:  CT abdomen from 05/05/2016 FINDINGS: Cardiovascular: Aortic arch and left anterior descending coronary artery atherosclerotic calcification. Calcified mitral valve. Mild cardiomegaly. Mediastinum/Nodes: Small paratracheal lymph nodes are present better not appreciably pathologically enlarged. A right hilar node measures 1 cm in short axis on image 56/ 3, borderline prominent. Lungs/Pleura: Scattered bandlike subsegmental atelectasis in both lungs. Centrilobular emphysema. Increased medial atelectasis in the left lower lobe compared to the prior CT abdomen. Small right and trace left pleural effusions with some fluid tracking in the major fissures. Pleural effusions appear new compared to 05/05/16. Upper Abdomen: High density material in the pancreatic duct, probably contrast, only partially included on today' s exam. Musculoskeletal: Subacute healing fracture the right seventh rib laterally. Old healed right third rib fracture. Thoracic spondylosis and mild thoracic kyphosis. IMPRESSION: 1. New small right and trace left pleural effusions. Mild cardiomegaly. 2. Borderline enlarged right hilar lymph node at 1 cm in short axis. 3. No definite findings of metastatic disease to the chest. 4. Centrilobular emphysema. 5. High density in the visualized part of the dorsal pancreatic duct, probably  contrast medium. 6. Subacute healing fracture of the right seventh rib laterally. 7. Atherosclerosis, including the left anterior descending coronary artery. Electronically Signed   By: Van Clines M.D.   On: 05/07/2016 12:03   Ct Abdomen Pelvis W Contrast  Result Date: 05/05/2016 CLINICAL DATA:  Epigastric pain. Hx diabetes, GERD, lumbar disc degeneration, chronic diarrhea, uterine polyp removal, umbilical hernia, tubal ligation, and cholecystectomy. EXAM: CT ABDOMEN AND PELVIS WITH CONTRAST TECHNIQUE: Multidetector CT imaging of the abdomen and pelvis was performed using the standard protocol following bolus administration of intravenous contrast. CONTRAST:  145mL ISOVUE-300 IOPAMIDOL (ISOVUE-300) INJECTION 61% COMPARISON:  ERCP 03/18/2016.  MRI 03/16/2016.  CT 03/15/2016. FINDINGS: Lower chest: Scattered emphysematous changes in the lung bases. Fibrosis or atelectasis in the lung bases. Calcification in the mitral valve annulus and coronary arteries. Hepatobiliary: Since the previous study, there is interval placement of a bile duct stent. Mild residual bile duct dilatation is less prominent than on prior study. Surgical absence of the gallbladder. No focal liver lesions. Pancreas: Diffuse pancreatic ductal dilatation with pancreatic parenchymal atrophy. Mass lesion demonstrated in the head of the  pancreas on previous MRI is not well visualized at CT. No inflammatory stranding. Spleen: Focal lesion in the spleen measuring about 6 mm diameter is unchanged since previous study. This probably represents a small cyst. Additional subcentimeter lesion in the upper spleen is also unchanged. Adrenals/Urinary Tract: Adrenal glands are unremarkable. Kidneys are normal, without renal calculi, focal lesion, or hydronephrosis. Bladder is unremarkable. Stomach/Bowel: Stomach and small bowel are mostly decompressed. No small bowel distention or inflammatory infiltration. Colon is diffusely stool-filled without  abnormal distention or inflammation. Scattered colonic diverticula. Appendix is not identified. Vascular/Lymphatic: Aortic atherosclerosis. No enlarged abdominal or pelvic lymph nodes. Reproductive: Uterus and bilateral adnexa are unremarkable. Other: Small left inguinal hernia containing fat. No free air or free fluid in the abdomen. Musculoskeletal: Degenerative changes in the spine. No destructive bone lesions. IMPRESSION: Interval placement of a bile duct stent with some decompression of the bile ducts. Mild residual biliary dilatation. Pancreatic ductal dilatation and atrophy. Known pancreatic mass lesion is not well depicted at CT. No evidence of bowel obstruction or inflammation. Electronically Signed   By: Lucienne Capers M.D.   On: 05/05/2016 04:37   Dg Chest Port 1 View  Result Date: 05/05/2016 CLINICAL DATA:  Confusion, hyperglycemia, and headache tonight EXAM: PORTABLE CHEST 1 VIEW COMPARISON:  03/28/2016 FINDINGS: Shallow inspiration. The heart size and mediastinal contours are within normal limits. Both lungs are clear. The visualized skeletal structures are unremarkable. IMPRESSION: No active disease. Electronically Signed   By: Lucienne Capers M.D.   On: 05/05/2016 00:04   Dg Ercp  Result Date: 05/06/2016 CLINICAL DATA:  Adenocarcinoma of the pancreas, cholangitis, jaundice and previous endoscopic biliary stent placement. EXAM: ERCP TECHNIQUE: Multiple spot images obtained with the fluoroscopic device and submitted for interpretation post-procedure. COMPARISON:  CT of the abdomen on 05/05/2016 as well as multiple additional prior imaging studies. FINDINGS: Imaging during ERCP demonstrates cannulation of the common bile duct and removal of an indwelling biliary stent. A guidewire was able to be passed through a biliary stricture. The stent was not able to be replaced. IMPRESSION: Imaging demonstrating removal of endoscopic common bile duct stent. The stent was not able to be replaced. These  images were submitted for radiologic interpretation only. Please see the procedural report for the amount of contrast and the fluoroscopy time utilized. Electronically Signed   By: Aletta Edouard M.D.   On: 05/06/2016 14:47   Ir Biliary Drain Placement With Cholangiogram  Result Date: 05/07/2016 INDICATION: 71 year old female with malignant obstructed jaundice and cholangitis. She recently had a plastic biliary drain placed earlier this month but presents with an occluded drain and inability to pass through the distal common bile duct stricture on ERCP. She requires percutaneous transhepatic cholangiogram and drain placement. EXAM: PRIOR PERCUTANEOUS TRANSHEPATIC CHOLANGIOGRAM AND PLACEMENT OF AN INTERNAL/ EXTERNAL BILIARY DRAINAGE CATHETER MEDICATIONS: Patient is currently receiving 3.375 g Zosyn IV. No additional antibiotic given ANESTHESIA/SEDATION: Moderate (conscious) sedation was employed during this procedure. A total of Versed 2.5 mg and Fentanyl 100 mcg was administered intravenously. Moderate Sedation Time: 20 minutes. The patient's level of consciousness and vital signs were monitored continuously by radiology nursing throughout the procedure under my direct supervision. FLUOROSCOPY TIME:  Fluoroscopy Time: 3 minutes 18 seconds (62 mGy). COMPLICATIONS: None immediate. PROCEDURE: Informed written consent was obtained from the patient after a thorough discussion of the procedural risks, benefits and alternatives. All questions were addressed. Maximal Sterile Barrier Technique was utilized including caps, mask, sterile gowns, sterile gloves, sterile drape, hand hygiene and  skin antiseptic. A timeout was performed prior to the initiation of the procedure. The right upper quadrant was interrogated with ultrasound. Interval development of progressive biliary ductal dilatation. Dilated bile ducts can be identified sonographically. A suitable posterior division all duct in the right liver was identified. The  overlying skin was anesthetized with 1% lidocaine. Under real-time sonographic guidance, the bile duct was punctured with a 21 gauge Accustick needle in a single pass. There is immediate return of turbid bile through the needle hub. A gentle hand injection of contrast material was performed in a limited cholangiogram was obtained. There is extensive intra hepatic biliary ductal dilatation. The 0.018 wire was advanced into the biliary system centrally. The Accustick needle was then exchanged for the Accustick sheath which was advanced into the common hepatic duct. Additional contrast injection was performed. There is a high-grade lung segment stenosis of the common bile duct. A small wisp of contrast material passes through the stricture and into the duodenum. An Amplatz wire was successfully advanced through the stricture and into the duodenum. The skin tract was then dilated to 10 [place] and a Cook 10 French internal/external biliary drainage catheter was advanced over the wire and positioned with the locking loop in the duodenum. The tube was connected to bag drainage and secured to the skin with 0 Prolene suture. A sterile bandage was applied. The patient tolerated the procedure well. IMPRESSION: 1. 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 2. Successful placement of a 10 French internal/external biliary drainage catheter. Aspirated bile is frankly purulent. A sample was sent for culture. PLAN: 1. Maintain biliary drain to bag drainage until symptoms of cholangitis have resolved and bilirubin has nearly normalized. 2. It would be preferable to cap the biliary drain prior to discharge if the above can be accomplished. 3. Follow electrolytes and replete as needed. 4. Return to interventional radiology in 2-4 weeks for biliary stent placement. Signed, Criselda Peaches, MD [facility]iology Electronically Signed   By: Jacqulynn Cadet M.D.   On: 05/07/2016 11:22   Ir Biliary Stent(s) Existing Access Inc Dilation Cath Exchange  Result Date: 05/19/2016 INDICATION: 71 year old female with pancreatic cancer and malignant obstructed jaundice. A percutaneous transhepatic biliary drain was placed on 05/07/2016 via a right posterior duct approach. She is now doing better and presents for placement of an internal stent. EXAM: 1. Cholangiogram through existing access 2. Placement of a 10 x 60 mm wall flex self expanding covered metallic stent 3. Biliary drain exchange MEDICATIONS: 3.375 g Zosyn; The antibiotic was administered within an appropriate time frame prior to the initiation of the procedure. ANESTHESIA/SEDATION: Moderate (conscious) sedation was employed during this procedure. A total of Versed 3.5 mg and Fentanyl 100 mcg was administered intravenously. Moderate Sedation Time: 14 minutes. The patient's level of consciousness and vital signs were monitored continuously by radiology nursing throughout the procedure under my direct supervision. FLUOROSCOPY TIME:  Fluoroscopy Time: 0 minutes 5 seconds (48 mGy). COMPLICATIONS: SIR LEVEL B - Normal therapy, includes overnight admission for observation. Approximately 2 hours after discharge the patient became febrile with rigors while in her oncologist's office. She was admitted overnight for observation, IV fluids and additional antibiotics. PROCEDURE: Informed written consent was obtained from the patient after a thorough discussion of the procedural risks, benefits and alternatives. All questions were addressed. Maximal Sterile Barrier Technique was utilized  including caps, mask, sterile gowns, sterile gloves, sterile drape, hand hygiene and skin antiseptic. A timeout was performed prior to the initiation of the procedure. The existing 10 [place] biliary tube was  cut and removed over an Amplatz wire. An 8 [place] vascular sheath was advanced over the wire and into the duodenum. A pull-back cholangiogram was performed. Persisting complete long segment occlusion of the common bile duct. There is some persistent intra and extrahepatic biliary ductal dilatation. No obvious debris or stones. While keeping the Amplatz wire within the duodenum as a safety wire, an angled catheter was advanced through the 8 French sheath coaxially and positioned in the duodenum. The tip of the Bentson wire was then advanced through the [place] the catheter in used to measure the length of the common bile duct stenosis which measures approximately 5 cm. Therefore, we will proceed with placement of a 10 x 60 mm wall flex self expanding covered metal biliary stent. The stent was selected in advanced over the Amplatz wire. The stent was then successfully deployed. There was excellent opening of the proximal and distal landing zone and partial opening in the region of the stricture. There was immediate evacuation of contrast an bile through the stent. Given the patient's past history of colon check his, the decision was made to leave a internal biliary drain into a we have confirmed that the stent drainage is sufficient. The Amplatz wire was withdrawn through the stent and coiled in the hepatic biliary confluence. The 8 French sheath was removed. A new 10.2 [place] cook all-purpose drainage catheter was advanced over the wire and coiled in the region of the biliary confluence. The catheter was gently flushed and capped. The patient tolerated the procedure well. IMPRESSION: 1. 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 2. Successful placement of a 10 x 60 wall flex self expanding covered metallic stent. 3. A 10 French internal biliary drain was left in place at the hepatic confluence proximal to the stent. This tube will be left capped for a physiologic trial to insure that the  metal stent is sufficient. PLAN: 1. Patient developed fever and rigors several hours following the procedure requiring admission for observation. This is likely secondary to transient bacteremia related to colonization of the bile and today's manipulation. 2. Biliary drain should be left to bag drainage overnight. If the patient is feeling well and ready for discharge tomorrow, the drain can be capped prior to discharge. 3. Patient to return Interventional Radiology in 4 weeks for cholangiogram and possible drain removal. A complete metabolic panel will be drawn at that time to assess bilirubin. 4. The family understands that if she develops symptoms of recurrent biliary obstruction or jaundice during the four-week physiologic trial they should immediately connect the drainage catheter back to gravity bag drainage. A was noted the patient is currently in a rehab facility. Therefore, these instructions were written down on a progress note for the family to give to her caregivers. Signed, Criselda Peaches, MD [facility] Radiology Electronically Signed   By: Jacqulynn Cadet M.D.   On: 05/19/2016 16:45       Subjective: Patient with pain at the right upper quadrant improved, tolerating po well, no nausea or vomiting. Episodes of confusion, self-limited.   Discharge Exam: Vitals:   05/24/16 0612 05/24/16 1417  BP: (!) 145/54 (!) 142/70  Pulse: (!) 42 (!) 48  Resp: 18 18  Temp: 97.6 F (36.4 C) 98.3 F (36.8  C)   Vitals:   05/23/16 1826 05/23/16 2105 05/24/16 0612 05/24/16 1417  BP: (!) 144/84 (!) 152/60 (!) 145/54 (!) 142/70  Pulse: (!) 50 (!) 47 (!) 42 (!) 48  Resp:  18 18 18   Temp:  97.6 F (36.4 C) 97.6 F (36.4 C) 98.3 F (36.8 C)  TempSrc:  Axillary Oral Oral  SpO2:  95% 95% 95%  Weight:      Height:       General exam: deconditioned E ENT: mild pallor, oral mucosa moist. Respiratory system: Clear to auscultation. Respiratory  effort normal. Mild decreased breath sounds at bases.  Cardiovascular system: S1 & S2 heard, RRR. No JVD, murmurs, rubs, gallops or clicks. No pedal edema. Gastrointestinal system: Abdomen is nondistended, soft and nontender. No organomegaly or masses felt. Normal bowel sounds heard. Drain in the right upper quadrant in place.  Central nervous system: Alert and oriented. No focal neurological deficits. Extremities: Symmetric 5 x 5 power. Skin: No rashes, lesions or ulcers    The results of significant diagnostics from this hospitalization (including imaging, microbiology, ancillary and laboratory) are listed below for reference.     Microbiology: Recent Results (from the past 240 hour(s))  Culture, Blood     Status: None (Preliminary result)   Collection Time: 05/19/16  3:17 PM  Result Value Ref Range Status   BLOOD CULTURE, ROUTINE Preliminary report  Preliminary   RESULT 1 Comment  Preliminary    Comment: No growth in 36 - 48 hours. Received aerobic bottle only.   Culture, Blood     Status: None (Preliminary result)   Collection Time: 05/19/16  3:18 PM  Result Value Ref Range Status   BLOOD CULTURE, ROUTINE Preliminary report  Preliminary   RESULT 1 Comment  Preliminary    Comment: No growth in 36 - 48 hours. Received aerobic bottle only.   Culture, body fluid-bottle     Status: Abnormal   Collection Time: 05/20/16  1:50 PM  Result Value Ref Range Status   Specimen Description BILE  Final   Special Requests NONE  Final   Gram Stain   Final    YEAST IN BOTH AEROBIC AND ANAEROBIC BOTTLES CRITICAL RESULT CALLED TO, READ BACK BY AND VERIFIED WITH: M O'BRYANT,RN @0558  05/21/16 MKELLY,MLT Performed at [facility], [address] [place], [place] [address]    Culture CANDIDA ALBICANS (A)  Final   Report Status 05/23/2016 FINAL  Final  Gram stain     Status: None (Preliminary result)   Collection Time: 05/20/16  1:50 PM  Result Value Ref Range Status   Specimen  Description BILE  Final   Special Requests NONE  Final   Gram Stain   Final    NO WBC SEEN NO ORGANISMS SEEN Performed at [facility], [place] [address], [place], [place] [address]    Report Status PENDING  Incomplete  Culture, blood (routine x 2)     Status: None (Preliminary result)   Collection Time: 05/20/16  5:11 PM  Result Value Ref Range Status   Specimen Description BLOOD LEFT ANTECUBITAL  Final   Special Requests BOTTLES DRAWN AEROBIC AND ANAEROBIC 6CC  Final   Culture   Final    NO GROWTH 4 DAYS Performed at [facility], [place] [address] [place],  [address]    Report Status PENDING  Incomplete  Culture, blood (routine x 2)     Status: None (Preliminary result)   Collection Time: 05/20/16  5:18 PM  Result Value Ref Range Status   Specimen Description BLOOD LEFT HAND  Final   Special Requests BOTTLES DRAWN AEROBIC ONLY 5.5CC  Final   Culture   Final    NO GROWTH 4 DAYS Performed at [facility], [address], [place], [place] [address]    Report Status PENDING  Incomplete     Labs: BNP (last 3 results)  Recent Labs  01/04/16 1526 03/27/16 1323 03/28/16 2110  BNP 63.2 46.1 22.6   Basic Metabolic Panel:  Recent Labs Lab 05/19/16 1906 05/19/16 1920 05/20/16 0550 05/21/16 0525 05/22/16 0525 05/23/16 0511  NA 136  --  138 135 139 138  K 3.4*  --  3.7 3.2* 4.0 4.0  CL 93*  --  98* 99* 104 103  CO2 31  --  29 28 27 27   GLUCOSE 246*  --  232* 247* 161* 142*  BUN 25*  --  23* 16 10 8   CREATININE 1.13*  --  1.05* 0.76 0.69 0.68  CALCIUM 8.2*  --  7.9* 8.1* 8.7* 8.9  MG 1.2*  --   --   --   --   --   PHOS  --  3.7  --   --   --   --    Liver Function Tests:  Recent Labs Lab 05/19/16 0726 05/19/16 1906 05/20/16 0550 05/22/16 0525  AST 31 37 30 22  ALT 19 21 19 14   ALKPHOS 458* 436* 384* 330*  BILITOT 2.2* 2.0* 1.9* 1.2  PROT 7.5 7.0 6.6 6.1*  ALBUMIN 2.9* 2.7* 2.4* 2.1*   No results for input(s): LIPASE, AMYLASE in the  last 168 hours. No results for input(s): AMMONIA in the last 168 hours. CBC:  Recent Labs Lab 05/20/16 0550 05/21/16 0525 05/22/16 0525 05/23/16 0511 05/24/16 0509  WBC 14.1* 10.7* 8.9 7.8 6.9  NEUTROABS 11.2* 7.6 5.7 4.6 3.4  HGB 10.5* 8.7* 8.6* 8.6* 9.1*  HCT 32.6* 27.2* 27.3* 27.2* 29.0*  MCV 88.1 88.9 89.5 86.1 87.9  PLT 360 297 308 324 320   Cardiac Enzymes: No results for input(s): CKTOTAL, CKMB, CKMBINDEX, TROPONINI in the last 168 hours. BNP: Invalid input(s): POCBNP CBG:  Recent Labs Lab 05/23/16 1158 05/23/16 1735 05/23/16 2101 05/24/16 0740 05/24/16 1141  GLUCAP 190* 187* 79 80 124*   D-Dimer No results for input(s): DDIMER in the last 72 hours. Hgb A1c No results for input(s): HGBA1C in the last 72 hours. Lipid Profile No results for input(s): CHOL, HDL, LDLCALC, TRIG, CHOLHDL, LDLDIRECT in the last 72 hours. Thyroid function studies No results for input(s): TSH, T4TOTAL, T3FREE, THYROIDAB in the last 72 hours.  Invalid input(s): FREET3 Anemia work up No results for input(s): VITAMINB12, FOLATE, FERRITIN, TIBC, IRON, RETICCTPCT in the last 72 hours. Urinalysis    Component Value Date/Time   COLORURINE YELLOW 05/19/2016 0250   APPEARANCEUR HAZY (A) 05/19/2016 0250   LABSPEC 1.013 05/19/2016 0250   PHURINE 5.0 05/19/2016 0250   GLUCOSEU NEGATIVE 05/19/2016 0250   GLUCOSEU NEGATIVE 11/04/2015 0956   HGBUR SMALL (A) 05/19/2016 0250   BILIRUBINUR NEGATIVE 05/19/2016 0250   BILIRUBINUR neg 08/17/2015 1547   KETONESUR NEGATIVE 05/19/2016 0250   PROTEINUR NEGATIVE 05/19/2016 0250   UROBILINOGEN 0.2 11/04/2015 0956   NITRITE NEGATIVE 05/19/2016 0250   LEUKOCYTESUR LARGE (A) 05/19/2016 0250   Sepsis Labs Invalid input(s): PROCALCITONIN,  WBC,  LACTICIDVEN Microbiology Recent Results (from the past 240 hour(s))  Culture, Blood  Status: None (Preliminary result)   Collection Time: 05/19/16  3:17 PM  Result Value Ref Range Status   BLOOD  CULTURE, ROUTINE Preliminary report  Preliminary   RESULT 1 Comment  Preliminary    Comment: No growth in 36 - 48 hours. Received aerobic bottle only.   Culture, Blood     Status: None (Preliminary result)   Collection Time: 05/19/16  3:18 PM  Result Value Ref Range Status   BLOOD CULTURE, ROUTINE Preliminary report  Preliminary   RESULT 1 Comment  Preliminary    Comment: No growth in 36 - 48 hours. Received aerobic bottle only.   Culture, body fluid-bottle     Status: Abnormal   Collection Time: 05/20/16  1:50 PM  Result Value Ref Range Status   Specimen Description BILE  Final   Special Requests NONE  Final   Gram Stain   Final    YEAST IN BOTH AEROBIC AND ANAEROBIC BOTTLES CRITICAL RESULT CALLED TO, READ BACK BY AND VERIFIED WITH: M O'BRYANT,RN @0558  05/21/16 MKELLY,MLT Performed at [facility], [address] [place], [place] [address]    Culture CANDIDA ALBICANS (A)  Final   Report Status 05/23/2016 FINAL  Final  Gram stain     Status: None (Preliminary result)   Collection Time: 05/20/16  1:50 PM  Result Value Ref Range Status   Specimen Description BILE  Final   Special Requests NONE  Final   Gram Stain   Final    NO WBC SEEN NO ORGANISMS SEEN Performed at [facility], [place] [address], [place], [place] [address]    Report Status PENDING  Incomplete  Culture, blood (routine x 2)     Status: None (Preliminary result)   Collection Time: 05/20/16  5:11 PM  Result Value Ref Range Status   Specimen Description BLOOD LEFT ANTECUBITAL  Final   Special Requests BOTTLES DRAWN AEROBIC AND ANAEROBIC 6CC  Final   Culture   Final    NO GROWTH 4 DAYS Performed at [facility], [place] [address] [place], [place] [address]    Report Status PENDING  Incomplete  Culture, blood (routine x 2)     Status: None (Preliminary result)   Collection Time: 05/20/16  5:18 PM  Result Value Ref Range Status   Specimen Description BLOOD LEFT HAND  Final   Special Requests  BOTTLES DRAWN AEROBIC ONLY 5.5CC  Final   Culture   Final    NO GROWTH 4 DAYS Performed at [facility], [place] [address] [place], [place] [address]    Report Status PENDING  Incomplete     Time coordinating discharge: 45 minutes  SIGNED:   [name], MD  [facility] 05/24/2016, 2:39 PM Pager   If 7PM-7AM, please contact night-coverage [email or web address] Password [email or web address]

## 2016-05-24 NOTE — Progress Notes (Signed)
Received orders from IR PA to send pt to SNF with biliary drain capped. Drsg to site changed. Purulent drainage present at site. Tye, nurse at [facility] notified of drain being capped for trial and extra caps sent with pt. To follow up with IR in 4wks. Transferred via [place]. Condition stable. Eulas Post, RN

## 2016-05-25 ENCOUNTER — Other Ambulatory Visit: Payer: Self-pay | Admitting: Internal Medicine

## 2016-05-25 DIAGNOSIS — K831 Obstruction of bile duct: Principal | ICD-10-CM

## 2016-05-25 DIAGNOSIS — K830 Cholangitis: Secondary | ICD-10-CM | POA: Diagnosis not present

## 2016-05-25 DIAGNOSIS — A419 Sepsis, unspecified organism: Secondary | ICD-10-CM | POA: Diagnosis not present

## 2016-05-25 DIAGNOSIS — C259 Malignant neoplasm of pancreas, unspecified: Secondary | ICD-10-CM | POA: Diagnosis not present

## 2016-05-25 DIAGNOSIS — R509 Fever, unspecified: Secondary | ICD-10-CM | POA: Diagnosis not present

## 2016-05-25 DIAGNOSIS — C801 Malignant (primary) neoplasm, unspecified: Secondary | ICD-10-CM

## 2016-05-25 DIAGNOSIS — I519 Heart disease, unspecified: Secondary | ICD-10-CM | POA: Diagnosis not present

## 2016-05-25 DIAGNOSIS — E119 Type 2 diabetes mellitus without complications: Secondary | ICD-10-CM | POA: Diagnosis not present

## 2016-05-25 LAB — CULTURE, BLOOD (SINGLE)

## 2016-05-25 LAB — CULTURE, BLOOD (ROUTINE X 2)
CULTURE: NO GROWTH
Culture: NO GROWTH

## 2016-05-26 LAB — GRAM STAIN: GRAM STAIN: NONE SEEN

## 2016-05-30 DIAGNOSIS — A419 Sepsis, unspecified organism: Secondary | ICD-10-CM | POA: Diagnosis not present

## 2016-05-30 DIAGNOSIS — C250 Malignant neoplasm of head of pancreas: Secondary | ICD-10-CM | POA: Diagnosis not present

## 2016-05-30 DIAGNOSIS — I5032 Chronic diastolic (congestive) heart failure: Secondary | ICD-10-CM | POA: Diagnosis not present

## 2016-05-30 DIAGNOSIS — K830 Cholangitis: Secondary | ICD-10-CM | POA: Diagnosis not present

## 2016-06-01 ENCOUNTER — Encounter: Payer: Self-pay | Admitting: Radiation Oncology

## 2016-06-01 ENCOUNTER — Telehealth: Payer: Self-pay | Admitting: *Deleted

## 2016-06-01 ENCOUNTER — Ambulatory Visit
Admission: RE | Admit: 2016-06-01 | Discharge: 2016-06-01 | Disposition: A | Discharge status: Home / Self Care | Payer: PPO | Source: Ambulatory Visit | Attending: Radiation Oncology | Admitting: Radiation Oncology

## 2016-06-01 VITALS — BP 135/62 | HR 65 | Temp 98.1°F | Resp 20 | Ht 62.0 in | Wt 230.0 lb

## 2016-06-01 DIAGNOSIS — Z79899 Other long term (current) drug therapy: Secondary | ICD-10-CM | POA: Diagnosis not present

## 2016-06-01 DIAGNOSIS — G4733 Obstructive sleep apnea (adult) (pediatric): Secondary | ICD-10-CM | POA: Diagnosis not present

## 2016-06-01 DIAGNOSIS — Z882 Allergy status to sulfonamides status: Secondary | ICD-10-CM | POA: Insufficient documentation

## 2016-06-01 DIAGNOSIS — Z510 Encounter for antineoplastic radiation therapy: Secondary | ICD-10-CM | POA: Diagnosis not present

## 2016-06-01 DIAGNOSIS — Z885 Allergy status to narcotic agent status: Secondary | ICD-10-CM | POA: Insufficient documentation

## 2016-06-01 DIAGNOSIS — Z9689 Presence of other specified functional implants: Secondary | ICD-10-CM | POA: Diagnosis not present

## 2016-06-01 DIAGNOSIS — K219 Gastro-esophageal reflux disease without esophagitis: Secondary | ICD-10-CM | POA: Diagnosis not present

## 2016-06-01 DIAGNOSIS — C250 Malignant neoplasm of head of pancreas: Secondary | ICD-10-CM | POA: Insufficient documentation

## 2016-06-01 DIAGNOSIS — K580 Irritable bowel syndrome with diarrhea: Secondary | ICD-10-CM | POA: Insufficient documentation

## 2016-06-01 DIAGNOSIS — M5136 Other intervertebral disc degeneration, lumbar region: Secondary | ICD-10-CM | POA: Insufficient documentation

## 2016-06-01 DIAGNOSIS — Z888 Allergy status to other drugs, medicaments and biological substances status: Secondary | ICD-10-CM | POA: Diagnosis not present

## 2016-06-01 DIAGNOSIS — E785 Hyperlipidemia, unspecified: Secondary | ICD-10-CM | POA: Insufficient documentation

## 2016-06-01 DIAGNOSIS — E119 Type 2 diabetes mellitus without complications: Secondary | ICD-10-CM | POA: Insufficient documentation

## 2016-06-01 DIAGNOSIS — Z8249 Family history of ischemic heart disease and other diseases of the circulatory system: Secondary | ICD-10-CM | POA: Insufficient documentation

## 2016-06-01 DIAGNOSIS — K830 Cholangitis: Secondary | ICD-10-CM | POA: Insufficient documentation

## 2016-06-01 DIAGNOSIS — Z794 Long term (current) use of insulin: Secondary | ICD-10-CM | POA: Insufficient documentation

## 2016-06-01 DIAGNOSIS — Z9981 Dependence on supplemental oxygen: Secondary | ICD-10-CM | POA: Insufficient documentation

## 2016-06-01 DIAGNOSIS — I110 Hypertensive heart disease with heart failure: Secondary | ICD-10-CM | POA: Diagnosis not present

## 2016-06-01 DIAGNOSIS — G2581 Restless legs syndrome: Secondary | ICD-10-CM | POA: Diagnosis not present

## 2016-06-01 DIAGNOSIS — M1990 Unspecified osteoarthritis, unspecified site: Secondary | ICD-10-CM | POA: Insufficient documentation

## 2016-06-01 DIAGNOSIS — Z7982 Long term (current) use of aspirin: Secondary | ICD-10-CM | POA: Diagnosis not present

## 2016-06-01 DIAGNOSIS — G629 Polyneuropathy, unspecified: Secondary | ICD-10-CM | POA: Insufficient documentation

## 2016-06-01 DIAGNOSIS — I5030 Unspecified diastolic (congestive) heart failure: Secondary | ICD-10-CM | POA: Insufficient documentation

## 2016-06-01 NOTE — Telephone Encounter (Signed)
Called [facility], spoke with secretary,asked if trasnport was on the way to bring the patient for tconsult,was put on hold for 6/5 minutes hung up the phone 1:24 PM

## 2016-06-01 NOTE — Progress Notes (Signed)
Radiation Oncology         ([phone]) [phone] ________________________________  Name: SULEYMA WAFER MRN: [id number]  Date: 06/01/2016  DOB: [birth year]  [id number] Loni Muse Sharlet [place]ina, MD  Truitt Merle, MD     REFERRING PHYSICIAN: Truitt Merle, MD   DIAGNOSIS: The encounter diagnosis was Malignant neoplasm of head of pancreas [facility]).   HISTORY OF PRESENT ILLNESS: COURTNEE MYER is a 71 y.o. female seen at the request of Dr. Burr Medico for an unstaged adenocarcinoma of the pancreas. The patient presented to the ED with jaundice, diarrhea, confusion, and abdominal pain in January 2018. 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 She had an MRI of her abdomen as well on 03/16/16 which revealed 10-11 mm of pancreatic ductal dilitation. The pancreas was atrophic in the body and tail, and in the uncinate process, a 2.2 x 2.8 cm hypoenhancing lesion was seen. No adenopathy was seen. She underwent sphincterotomy and biliary stent placement endoscopically on 03/18/16. She has had a complicated course with readmission for Flu, and UTI. She did undergo EUS on 04/28/16 with Dr. Ardis Hughs. This revealed a 29 mm lesion with irregular borders. There was no involvement of the SMA, SMV, PV, or celiac trunk. The pancreatic duct indicated dilatation to 8 mm. No peripancreatic adenopathy was noted.   She had repeat evaluation in the hospital setting about a week later and her CT head on 05/05/16 was negative, and CT abdomen/pelvis revealed interval placement of the bile duct stent with decompression, and pancreatic ductal dilatation. Cholangitis occurred and she had a clogged stent noted on ERCP, and a new stent was not able to be placed. She had a external biliary drain placed, and was treated for infective cholangitis. She has her indwelling stent still in place and this has been exchanged and is now a 23F pigtail drain. It is not currently draining, and the patient, and will have this  evaluated next Monday with subsequent cholangiogram to determine if it can be removed. She comes today to discuss the role of radiotherapy in the management of her cancer as she is not a surgical candidate, and is not medically appropriate for chemotherapy currently.  PREVIOUS RADIATION THERAPY: No   PAST MEDICAL HISTORY:  Past Medical History:  Diagnosis Date  . Anemia   . Cellulitis    LOWER EXTREMITIES  . Chronic diarrhea    a/w nausea - felt related to IBS  . Deaf    left side only  . Diastolic CHF ([place])   . Disc degeneration, lumbar   . GERD (gastroesophageal reflux disease)   . Hyperlipidemia    hx rhabdo on statins  . Hypertension   . Neuropathy ([facility])    feet, toes and fingers  . On home oxygen therapy    uses oxygen 2 liters min per [place] at night and prn during day  . OSA (obstructive sleep apnea)    05/2009 sleep study - refuses CPAP  . Osteoarthritis   . RLS (restless legs syndrome)   . Shortness of breath    chronic  . Stasis dermatitis   . Type II or unspecified type diabetes mellitus without mention of complication, not stated as uncontrolled    insulin dep       PAST SURGICAL HISTORY: Past Surgical History:  Procedure Laterality Date  . BILIARY STENT PLACEMENT N/A 05/06/2016   Procedure: BILIARY STENT PLACEMENT;  Surgeon: Gatha Mayer, MD;  Location: [facility];  Service: Endoscopy;  Laterality:  N/A;  . CHOLECYSTECTOMY  1997  . COLONOSCOPY N/A 12/03/2012   Procedure: COLONOSCOPY;  Surgeon: Lafayette Dragon, MD;  Location: [facility];  Service: Endoscopy;  Laterality: N/A;  . ENDOSCOPIC RETROGRADE CHOLANGIOPANCREATOGRAPHY (ERCP) WITH PROPOFOL N/A 05/06/2016   Procedure: ENDOSCOPIC RETROGRADE CHOLANGIOPANCREATOGRAPHY (ERCP) WITH PROPOFOL;  Surgeon: Gatha Mayer, MD;  Location: [facility];  Service: Endoscopy;  Laterality: N/A;  . ERCP N/A 03/18/2016   Procedure: ENDOSCOPIC RETROGRADE CHOLANGIOPANCREATOGRAPHY (ERCP);  Surgeon: Irene Shipper, MD;  Location:  [facility];  Service: Endoscopy;  Laterality: N/A;  . EUS N/A 04/28/2016   Procedure: UPPER ENDOSCOPIC ULTRASOUND (EUS) LINEAR;  Surgeon: [name], MD;  Location: [facility];  Service: Endoscopy;  Laterality: N/A;  . IR GENERIC HISTORICAL  05/07/2016   IR BILIARY DRAIN PLACEMENT WITH CHOLANGIOGRAM 05/07/2016 Jacqulynn Cadet, MD [facility]  . IR GENERIC HISTORICAL  05/19/2016   IR BILIARY STENT(S) EXISTING ACCESS INC DILATION CATH EXCHANGE 05/19/2016 Jacqulynn Cadet, MD [facility]  . TONSILLECTOMY  1970  . TUBAL LIGATION  1980  . UMBILICAL HERNIA REPAIR  1995  . uterine polyp removal  2008     FAMILY HISTORY:  Family History  Problem Relation Age of Onset  . Heart disease Mother   . Heart attack Mother 54  . Heart disease Father   . Heart attack Father 35  . Heart disease      family history  . Stomach cancer Paternal Grandmother   . Lung cancer Paternal Grandfather   . CVA      several aunts  . Heart attack      several aunts and an uncle  . Colon cancer Neg Hx      SOCIAL HISTORY:  reports that she has never smoked. She has never used smokeless tobacco. She reports that she does not drink alcohol or use drugs. The patient lives in [place] and is accompanied by her boyfriend. She is currently rehabbing at [facility].   ALLERGIES: Cymbalta [duloxetine hcl]; Statins; Sulfa antibiotics; Levemir [insulin detemir]; Morphine; and Zinc   MEDICATIONS:  Current Outpatient Prescriptions  Medication Sig Dispense Refill  . aspirin EC 81 MG tablet Take 1 tablet (81 mg total) by mouth daily.    . carvedilol (COREG) 25 MG tablet TAKE 1 TABLET BY MOUTH TWICE DAILY WITH FOOD 60 tablet 5  . Cholecalciferol (VITAMIN D3) 2000 units capsule Take 1 capsule (2,000 Units total) by mouth daily. 90 capsule 3  . citalopram (CELEXA) 20 MG tablet TAKE 1 TABLET (20 MG TOTAL) BY MOUTH DAILY. 30 tablet 1  . DULoxetine (CYMBALTA) 60 MG capsule Take 1 capsule (60 mg  total) by mouth daily. 90 capsule 1  . ezetimibe (ZETIA) 10 MG tablet TAKE ONE TABLET BY MOUTH DAILY 90 tablet 0  . furosemide (LASIX) 40 MG tablet Take 1 tablet (40 mg total) by mouth daily as needed for fluid or edema. 30 tablet 0  . gabapentin (NEURONTIN) 300 MG capsule TAKE TWO CAPSULES BY MOUTH FOUR TIMES A DAY 240 capsule 1  . hydrALAZINE (APRESOLINE) 25 MG tablet TAKE 1 TABLET (25 MG TOTAL) BY MOUTH 3 (THREE) TIMES DAILY. 90 tablet 1  . HYDROcodone-acetaminophen (NORCO) 10-325 MG tablet Take 1 tablet by mouth every 6 (six) hours as needed (pain). 10 tablet 0  . insulin aspart (NOVOLOG) 100 UNIT/ML injection Inject 0-20 Units into the skin 3 (three) times daily with meals. . CBG 70 - 120: 0 units CBG 121 - 150: 3 units CBG 151 -  200: 4 units CBG 201 - 250: 7 units CBG 251 - 300: 11 units CBG 301 - 350: 15 units CBG 351 - 400: 20 units    . insulin glargine (LANTUS) 100 UNIT/ML injection Inject 0.3 mLs (30 Units total) into the skin daily.    [name] lactulose (CHRONULAC) 10 GM/15ML solution Take 30 mLs (20 g total) by mouth daily. 240 mL 0  . LYRICA 200 MG capsule TAKE ONE CAPSULE BY MOUTH TWICE A DAY 60 capsule 0  . metFORMIN (GLUCOPHAGE) 500 MG tablet Take 500 mg by mouth daily with breakfast.    . potassium chloride 20 MEQ TBCR Take 20 mEq by mouth daily as needed. Take only if taking furosemide. 30 tablet 0  . rOPINIRole (REQUIP XL) 4 MG 24 hr tablet TAKE 1 TABLET (4 MG TOTAL) BY MOUTH AT BEDTIME. 30 tablet 0  . vitamin B-12 (CYANOCOBALAMIN) 500 MCG tablet Take 500 mcg by mouth daily. Reported on 09/09/2015    . feeding supplement, ENSURE ENLIVE, (ENSURE ENLIVE) LIQD Take 237 mLs by mouth 3 (three) times daily between meals. (Patient not taking: Reported on 06/01/2016) 237 mL 12  . NON FORMULARY Take 120 mLs by mouth 3 (three) times daily. Sugar-free "MedPass" for supplement.     No current facility-administered medications for this encounter.      REVIEW OF SYSTEMS: On review of  systems, the patient reports that she is doing well overall. She reports she actually feels better currently than she has in a few months. She denies any chest pain, shortness of breath, cough, fevers, chills, night sweats, unintended weight changes. She denies any bowel or bladder disturbances, and denies abdominal pain, nausea or vomiting, or jaundice. She denies any new skin changes or recurrence of jaundice. She denies any new musculoskeletal or joint aches or pains. A complete review of systems is obtained and is otherwise negative.     PHYSICAL EXAM:  Wt Readings from Last 3 Encounters:  06/01/16 230 lb (104.3 kg)  05/19/16 224 lb 13.9 oz (102 kg)  05/13/16 252 lb 10.4 oz (114.6 kg)   Temp Readings from Last 3 Encounters:  06/01/16 98.1 F (36.7 C) (Oral)  05/24/16 98.3 F (36.8 C) (Oral)  05/19/16 98.1 F (36.7 C) (Oral)   BP Readings from Last 3 Encounters:  06/01/16 135/62  05/24/16 (!) 142/70  05/19/16 (!) 137/55   Pulse Readings from Last 3 Encounters:  06/01/16 65  05/24/16 (!) 48  05/19/16 65   Pain Assessment Pain Score: 0-No pain/10  In general this is a well appearing caucasian female in no acute distress. She is alert and oriented x4 and appropriate throughout the examination. HEENT reveals that the patient is normocephalic, atraumatic. EOMs are intact. PERRLA, and sclera is anicteric. Skin is intact without any evidence of gross lesions. Cardiovascular exam reveals a regular rate and rhythm, no clicks rubs or murmurs are auscultated. Chest is clear to auscultation bilaterally. Lymphatic assessment is performed and does not reveal any adenopathy in the cervical, supraclavicular, axillary, or inguinal chains. Abdomen has active bowel sounds in all quadrants and is intact. The abdomen is soft, non tender, non distended. Lower extremities are negative for pretibial pitting edema, deep calf tenderness, cyanosis or clubbing.   ECOG = 1  0 - Asymptomatic (Fully active,  able to carry on all predisease activities without restriction)  1 - Symptomatic but completely ambulatory (Restricted in physically strenuous activity but ambulatory and able to carry out work of a light or sedentary  nature. For example, light housework, office work)  2 - Symptomatic, <50% in bed during the day (Ambulatory and capable of all self care but unable to carry out any work activities. Up and about more than 50% of waking hours)  3 - Symptomatic, >50% in bed, but not bedbound (Capable of only limited self-care, confined to bed or chair 50% or more of waking hours)  4 - Bedbound (Completely disabled. Cannot carry on any self-care. Totally confined to bed or chair)  5 - Death   Eustace Pen MM, Creech RH, Tormey DC, et al. [phone]). "Toxicity and response criteria of the [facility]". [place] Oncol. 5 (6): 649-55    LABORATORY DATA:  Lab Results  Component Value Date   WBC 6.9 05/24/2016   HGB 9.1 (L) 05/24/2016   HCT 29.0 (L) 05/24/2016   MCV 87.9 05/24/2016   PLT 320 05/24/2016   Lab Results  Component Value Date   NA 138 05/23/2016   K 4.0 05/23/2016   CL 103 05/23/2016   CO2 27 05/23/2016   Lab Results  Component Value Date   ALT 14 05/22/2016   AST 22 05/22/2016   ALKPHOS 330 (H) 05/22/2016   BILITOT 1.2 05/22/2016      RADIOGRAPHY: Ct Head Wo Contrast  Result Date: 05/05/2016 CLINICAL DATA:  71 year old female with headache and confusion. EXAM: CT HEAD WITHOUT CONTRAST TECHNIQUE: Contiguous axial images were obtained from the base of the skull through the vertex without intravenous contrast. COMPARISON:  Head CT dated 10/22/2014 FINDINGS: Brain: The ventricles and sulci appropriate in size for patient's age. Minimal periventricular and deep white matter chronic microvascular ischemic changes noted. There is no acute intracranial hemorrhage. No mass effect or midline shift noted. There is no extra-axial fluid collection. Vascular: No  hyperdense vessel or unexpected calcification. Skull: Normal. Negative for fracture or focal lesion. Sinuses/Orbits: No acute finding. Other: None. IMPRESSION: No acute intracranial pathology. Electronically Signed   By: Anner Crete M.D.   On: 05/05/2016 01:21   Ct Chest Wo Contrast  Result Date: 05/07/2016 CLINICAL DATA:  Pancreatic mass, recent bile ducts stent placement EXAM: CT CHEST WITHOUT CONTRAST TECHNIQUE: Multidetector CT imaging of the chest was performed following the standard protocol without IV contrast. COMPARISON:  CT abdomen from 05/05/2016 FINDINGS: Cardiovascular: Aortic arch and left anterior descending coronary artery atherosclerotic calcification. Calcified mitral valve. Mild cardiomegaly. Mediastinum/Nodes: Small paratracheal lymph nodes are present better not appreciably pathologically enlarged. A right hilar node measures 1 cm in short axis on image 56/ 3, borderline prominent. Lungs/Pleura: Scattered bandlike subsegmental atelectasis in both lungs. Centrilobular emphysema. Increased medial atelectasis in the left lower lobe compared to the prior CT abdomen. Small right and trace left pleural effusions with some fluid tracking in the major fissures. Pleural effusions appear new compared to 05/05/16. Upper Abdomen: High density material in the pancreatic duct, probably contrast, only partially included on today' s exam. Musculoskeletal: Subacute healing fracture the right seventh rib laterally. Old healed right third rib fracture. Thoracic spondylosis and mild thoracic kyphosis. IMPRESSION: 1. New small right and trace left pleural effusions. Mild cardiomegaly. 2. Borderline enlarged right hilar lymph node at 1 cm in short axis. 3. No definite findings of metastatic disease to the chest. 4. Centrilobular emphysema. 5. High density in the visualized part of the dorsal pancreatic duct, probably contrast medium. 6. Subacute healing fracture of the right seventh rib laterally. 7.  Atherosclerosis, including the left anterior descending coronary artery. Electronically Signed  By: Van Clines M.D.   On: 05/07/2016 12:03   Ct Abdomen Pelvis W Contrast  Result Date: 05/05/2016 CLINICAL DATA:  Epigastric pain. Hx diabetes, GERD, lumbar disc degeneration, chronic diarrhea, uterine polyp removal, umbilical hernia, tubal ligation, and cholecystectomy. EXAM: CT ABDOMEN AND PELVIS WITH CONTRAST TECHNIQUE: Multidetector CT imaging of the abdomen and pelvis was performed using the standard protocol following bolus administration of intravenous contrast. CONTRAST:  131mL ISOVUE-300 IOPAMIDOL (ISOVUE-300) INJECTION 61% COMPARISON:  ERCP 03/18/2016.  MRI 03/16/2016.  CT 03/15/2016. FINDINGS: Lower chest: Scattered emphysematous changes in the lung bases. Fibrosis or atelectasis in the lung bases. Calcification in the mitral valve annulus and coronary arteries. Hepatobiliary: Since the previous study, there is interval placement of a bile duct stent. Mild residual bile duct dilatation is less prominent than on prior study. Surgical absence of the gallbladder. No focal liver lesions. Pancreas: Diffuse pancreatic ductal dilatation with pancreatic parenchymal atrophy. Mass lesion demonstrated in the head of the pancreas on previous MRI is not well visualized at CT. No inflammatory stranding. Spleen: Focal lesion in the spleen measuring about 6 mm diameter is unchanged since previous study. This probably represents a small cyst. Additional subcentimeter lesion in the upper spleen is also unchanged. Adrenals/Urinary Tract: Adrenal glands are unremarkable. Kidneys are normal, without renal calculi, focal lesion, or hydronephrosis. Bladder is unremarkable. Stomach/Bowel: Stomach and small bowel are mostly decompressed. No small bowel distention or inflammatory infiltration. Colon is diffusely stool-filled without abnormal distention or inflammation. Scattered colonic diverticula. Appendix is not  identified. Vascular/Lymphatic: Aortic atherosclerosis. No enlarged abdominal or pelvic lymph nodes. Reproductive: Uterus and bilateral adnexa are unremarkable. Other: Small left inguinal hernia containing fat. No free air or free fluid in the abdomen. Musculoskeletal: Degenerative changes in the spine. No destructive bone lesions. IMPRESSION: Interval placement of a bile duct stent with some decompression of the bile ducts. Mild residual biliary dilatation. Pancreatic ductal dilatation and atrophy. Known pancreatic mass lesion is not well depicted at CT. No evidence of bowel obstruction or inflammation. Electronically Signed   By: Lucienne Capers M.D.   On: 05/05/2016 04:37   Dg Chest Port 1 View  Result Date: 05/05/2016 CLINICAL DATA:  Confusion, hyperglycemia, and headache tonight EXAM: PORTABLE CHEST 1 VIEW COMPARISON:  03/28/2016 FINDINGS: Shallow inspiration. The heart size and mediastinal contours are within normal limits. Both lungs are clear. The visualized skeletal structures are unremarkable. IMPRESSION: No active disease. Electronically Signed   By: Lucienne Capers M.D.   On: 05/05/2016 00:04   Dg Ercp  Result Date: 05/06/2016 CLINICAL DATA:  Adenocarcinoma of the pancreas, cholangitis, jaundice and previous endoscopic biliary stent placement. EXAM: ERCP TECHNIQUE: Multiple spot images obtained with the fluoroscopic device and submitted for interpretation post-procedure. COMPARISON:  CT of the abdomen on 05/05/2016 as well as multiple additional prior imaging studies. FINDINGS: Imaging during ERCP demonstrates cannulation of the common bile duct and removal of an indwelling biliary stent. A guidewire was able to be passed through a biliary stricture. The stent was not able to be replaced. IMPRESSION: Imaging demonstrating removal of endoscopic common bile duct stent. The stent was not able to be replaced. These images were submitted for radiologic interpretation only. Please see the procedural  report for the amount of contrast and the fluoroscopy time utilized. Electronically Signed   By: Aletta Edouard M.D.   On: 05/06/2016 14:47   Ir Biliary Drain Placement With Cholangiogram  Result Date: 05/07/2016 INDICATION: 71 year old female with malignant obstructed jaundice and cholangitis. She recently  had a plastic biliary drain placed earlier this month but presents with an occluded drain and inability to pass through the distal common bile duct stricture on ERCP. She requires percutaneous transhepatic cholangiogram and drain placement. EXAM: PRIOR PERCUTANEOUS TRANSHEPATIC CHOLANGIOGRAM AND PLACEMENT OF AN INTERNAL/ EXTERNAL BILIARY DRAINAGE CATHETER MEDICATIONS: Patient is currently receiving 3.375 g Zosyn IV. No additional antibiotic given ANESTHESIA/SEDATION: Moderate (conscious) sedation was employed during this procedure. A total of Versed 2.5 mg and Fentanyl 100 mcg was administered intravenously. Moderate Sedation Time: 20 minutes. The patient's level of consciousness and vital signs were monitored continuously by radiology nursing throughout the procedure under my direct supervision. FLUOROSCOPY TIME:  Fluoroscopy Time: 3 minutes 18 seconds (62 mGy). COMPLICATIONS: None immediate. PROCEDURE: Informed written consent was obtained from the patient after a thorough discussion of the procedural risks, benefits and alternatives. All questions were addressed. Maximal Sterile Barrier Technique was utilized including caps, mask, sterile gowns, sterile gloves, sterile drape, hand hygiene and skin antiseptic. A timeout was performed prior to the initiation of the procedure. The right upper quadrant was interrogated with ultrasound. Interval development of progressive biliary ductal dilatation. Dilated bile ducts can be identified sonographically. A suitable posterior division all duct in the right liver was identified. The overlying skin was anesthetized with 1% lidocaine. Under real-time sonographic  guidance, the bile duct was punctured with a 21 gauge Accustick needle in a single pass. There is immediate return of turbid bile through the needle hub. A gentle hand injection of contrast material was performed in a limited cholangiogram was obtained. There is extensive intra hepatic biliary ductal dilatation. The 0.018 wire was advanced into the biliary system centrally. The Accustick needle was then exchanged for the Accustick sheath which was advanced into the common hepatic duct. Additional contrast injection was performed. There is a high-grade lung segment stenosis of the common bile duct. A small wisp of contrast material passes through the stricture and into the duodenum. An Amplatz wire was successfully advanced through the stricture and into the duodenum. The skin tract was then dilated to 10 [place] and a Cook 10 French internal/external biliary drainage catheter was advanced over the wire and positioned with the locking loop in the duodenum. The tube was connected to bag drainage and secured to the skin with 0 Prolene suture. A sterile bandage was applied. The patient tolerated the procedure well. IMPRESSION: 1. 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 2. Successful placement of a 10 French internal/external biliary drainage catheter. Aspirated bile is frankly purulent. A sample was sent for culture. PLAN: 1. Maintain biliary drain to bag drainage until symptoms of cholangitis have resolved and bilirubin has nearly normalized. 2. It would be preferable to cap the biliary drain prior to discharge if the above can be accomplished. 3. Follow electrolytes and replete as needed. 4. Return to interventional radiology in 2-4 weeks for biliary stent placement. Signed, Criselda Peaches, MD [facility]  Radiology Electronically Signed   By: Jacqulynn Cadet M.D.   On: 05/07/2016 11:22   Ir Biliary Stent(s) Existing Access Inc Dilation Cath Exchange  Result Date: 05/19/2016 INDICATION: 71 year old female with pancreatic cancer and malignant obstructed jaundice. A percutaneous transhepatic biliary drain was placed on 05/07/2016 via a right posterior duct approach. She is now doing better and presents for placement of an internal stent. EXAM: 1. Cholangiogram through existing access  2. Placement of a 10 x 60 mm wall flex self expanding covered metallic stent 3. Biliary drain exchange MEDICATIONS: 3.375 g Zosyn; The antibiotic was administered within an appropriate time frame prior to the initiation of the procedure. ANESTHESIA/SEDATION: Moderate (conscious) sedation was employed during this procedure. A total of Versed 3.5 mg and Fentanyl 100 mcg was administered intravenously. Moderate Sedation Time: 14 minutes. The patient's level of consciousness and vital signs were monitored continuously by radiology nursing throughout the procedure under my direct supervision. FLUOROSCOPY TIME:  Fluoroscopy Time: 0 minutes 5 seconds (48 mGy). COMPLICATIONS: SIR LEVEL B - Normal therapy, includes overnight admission for observation. Approximately 2 hours after discharge the patient became febrile with rigors while in her oncologist's office. She was admitted overnight for observation, IV fluids and additional antibiotics. PROCEDURE: Informed written consent was obtained from the patient after a thorough discussion of the procedural risks, benefits and alternatives. All questions were addressed. Maximal Sterile Barrier Technique was utilized including caps, mask, sterile gowns, sterile gloves, sterile drape, hand hygiene and skin antiseptic. A timeout was performed prior to the initiation of the procedure. The existing 10 [place] biliary tube was cut and removed over an Amplatz wire. An 8 [place] vascular sheath was advanced  over the wire and into the duodenum. A pull-back cholangiogram was performed. Persisting complete long segment occlusion of the common bile duct. There is some persistent intra and extrahepatic biliary ductal dilatation. No obvious debris or stones. While keeping the Amplatz wire within the duodenum as a safety wire, an angled catheter was advanced through the 8 French sheath coaxially and positioned in the duodenum. The tip of the Bentson wire was then advanced through the [place] the catheter in used to measure the length of the common bile duct stenosis which measures approximately 5 cm. Therefore, we will proceed with placement of a 10 x 60 mm wall flex self expanding covered metal biliary stent. The stent was selected in advanced over the Amplatz wire. The stent was then successfully deployed. There was excellent opening of the proximal and distal landing zone and partial opening in the region of the stricture. There was immediate evacuation of contrast an bile through the stent. Given the patient's past history of colon check his, the decision was made to leave a internal biliary drain into a we have confirmed that the stent drainage is sufficient. The Amplatz wire was withdrawn through the stent and coiled in the hepatic biliary confluence. The 8 French sheath was removed. A new 10.2 [place] cook all-purpose drainage catheter was advanced over the wire and coiled in the region of the biliary confluence. The catheter was gently flushed and capped. The patient tolerated the procedure well. IMPRESSION: 1. 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 2. Successful placement of a 10 x 60 wall flex self expanding covered metallic stent. 3. A 10 French internal biliary drain was left in place at the hepatic confluence proximal to the stent. This tube will be left capped for a physiologic trial to insure that the metal stent is sufficient. PLAN: 1. Patient developed fever and rigors several  hours following the procedure requiring admission for observation. This is likely secondary to transient bacteremia related to colonization of the bile and today's manipulation. 2. Biliary drain should be left to bag drainage overnight. If the patient is feeling well and ready for discharge tomorrow, the drain can be capped prior to discharge. 3. Patient to return Interventional Radiology in 4 weeks for cholangiogram  and possible drain removal. A complete metabolic panel will be drawn at that time to assess bilirubin. 4. The family understands that if she develops symptoms of recurrent biliary obstruction or jaundice during the four-week physiologic trial they should immediately connect the drainage catheter back to gravity bag drainage. A was noted the patient is currently in a rehab facility. Therefore, these instructions were written down on a progress note for the family to give to her caregivers. Signed, Criselda Peaches, MD [facility] Radiology Electronically Signed   By: Jacqulynn Cadet M.D.   On: 05/19/2016 16:45       IMPRESSION/PLAN: 1.  Unstaged adenocarcinoma of the pancreas. Dr. Lisbeth Renshaw discusses the findings from her imaging dating back to earlier this year, as well as her recent findings on pathology. He discusses with her the role for radiotherapy and details the scenarios for when radiation is given rather than surgical resection. She's not been felt to be a candidate for surgical resection, and rather would be a possible candidate for SBRT to the pancreas as her prior imaging didn't reveal progression or metastatic disease. We will proceed with a PET scan to ensure that there is no metastatic disease. She is in agreement and Dr. Lisbeth Renshaw outlines that this will help with the planning of treatment as well as to make sure there has not been progression in her disease since her last set of images. He discusses a course of 5 fractions to the  pancreas if her PET was negative. He discusses the risks, benefits, short, and long term effects of treatment, and details the delivery and logistics of treatment. We will ask her to review these findings once we've had her PET scan performed.  2. Biliary drain. The patient will follow up with Dr. Laurence Ferrari for evaluation next week with subsequent cholangiogram. We will postpone simulation for radiotherapy untll her drain is removed, though still proceed with PET while this drain is in situ.  The above documentation reflects my direct findings during this shared patient visit. Please see the separate note by Dr. Lisbeth Renshaw on this date for the remainder of the patient's plan of care.    Carola Rhine, PAC

## 2016-06-01 NOTE — Progress Notes (Signed)
Please see the Nurse Progress Note in the MD Initial Consult Encounter for this patient. 

## 2016-06-03 ENCOUNTER — Telehealth: Payer: Self-pay | Admitting: *Deleted

## 2016-06-03 NOTE — Telephone Encounter (Signed)
XXXX 

## 2016-06-03 NOTE — Telephone Encounter (Signed)
CALLED PATIENT TO INFORM OF PET SCAN ON 06-10-16, ARRIVAL TIME - 10: 30 AM  @ [facility], PT. TO BE NPO - 6 HRS. PRIOR TO TEST AND PATIENT TO HOLD MORNING INSULIN, SPOKE WITH PATIENT'S HUSBAND ANDREW AND HE IS AWARE OF THIS TEST AND HE WILL INFORM HIS WIFE WHO IS IN [place]

## 2016-06-09 ENCOUNTER — Telehealth: Payer: Self-pay | Admitting: Internal Medicine

## 2016-06-09 ENCOUNTER — Ambulatory Visit (HOSPITAL_COMMUNITY)
Admission: RE | Admit: 2016-06-09 | Discharge: 2016-06-09 | Disposition: A | Discharge status: Home / Self Care | Payer: PPO | Source: Ambulatory Visit | Attending: Interventional Radiology | Admitting: Interventional Radiology

## 2016-06-09 ENCOUNTER — Encounter (HOSPITAL_COMMUNITY): Payer: Self-pay | Admitting: Interventional Radiology

## 2016-06-09 DIAGNOSIS — C259 Malignant neoplasm of pancreas, unspecified: Secondary | ICD-10-CM

## 2016-06-09 DIAGNOSIS — Z4803 Encounter for change or removal of drains: Secondary | ICD-10-CM | POA: Insufficient documentation

## 2016-06-09 DIAGNOSIS — K831 Obstruction of bile duct: Secondary | ICD-10-CM

## 2016-06-09 DIAGNOSIS — C801 Malignant (primary) neoplasm, unspecified: Secondary | ICD-10-CM

## 2016-06-09 DIAGNOSIS — Z434 Encounter for attention to other artificial openings of digestive tract: Secondary | ICD-10-CM | POA: Diagnosis not present

## 2016-06-09 HISTORY — PX: IR CHOLANGIOGRAM EXISTING TUBE: IMG6040

## 2016-06-09 MED ORDER — IOPAMIDOL (ISOVUE-300) INJECTION 61%
INTRAVENOUS | Status: AC
  Administered 2016-06-09: 10 mL
  Filled 2016-06-09: qty 50

## 2016-06-09 NOTE — Telephone Encounter (Signed)
Requesting orders to begin palliative care.

## 2016-06-09 NOTE — Telephone Encounter (Signed)
She already has hospice, yes? Why does she need concurrent palliative care?

## 2016-06-10 ENCOUNTER — Telehealth: Payer: Self-pay | Admitting: Urology

## 2016-06-10 ENCOUNTER — Encounter (HOSPITAL_COMMUNITY): Payer: Self-pay

## 2016-06-10 ENCOUNTER — Ambulatory Visit (HOSPITAL_COMMUNITY)
Admission: RE | Admit: 2016-06-10 | Discharge: 2016-06-10 | Disposition: A | Discharge status: Home / Self Care | Payer: PPO | Source: Ambulatory Visit | Attending: Radiation Oncology | Admitting: Radiation Oncology

## 2016-06-10 ENCOUNTER — Telehealth: Payer: Self-pay | Admitting: *Deleted

## 2016-06-10 DIAGNOSIS — C250 Malignant neoplasm of head of pancreas: Secondary | ICD-10-CM | POA: Diagnosis not present

## 2016-06-10 LAB — GLUCOSE, CAPILLARY
Glucose-Capillary: 307 mg/dL — ABNORMAL HIGH (ref 65–99)
Glucose-Capillary: 324 mg/dL — ABNORMAL HIGH (ref 65–99)

## 2016-06-10 NOTE — Telephone Encounter (Signed)
Referral placed.

## 2016-06-10 NOTE — Telephone Encounter (Signed)
Patient does not have hospice

## 2016-06-10 NOTE — Telephone Encounter (Signed)
LVM with Manus Gunning to call back in regards

## 2016-06-10 NOTE — Telephone Encounter (Signed)
I called and spoke with the patient's daughter, Melissa Myers [phone]) regarding her mothers PET scan being cancelled this morning due to FBG of 350+ on arrival in [place].  Advised that [facility] protocol is that they will not perform PET scan with glucose above 270 due to quality degradation of the imaging.  Melissa Myers reports that her mother at a can of peaches in syrup with cottage cheese this morning around 5am, prior to her procedure at 63:14HF which could certainly account for such elevated BS level. Advised that I will call the [facility] department to reschedule her PET for first available next week and will call her back with the new date/time.  We discussed the importance of following a strict low sugar/low carb diet for 48 hours prior to her exam and NPO for 6 hours prior to the PET.  They will check a fingerstick glucose prior to leaving the house on the date of her exam to ensure that her blood sugar is at a reasonable level.  If BS is above 250, they will need to call and reschedule.  She states her understanding and willingness to assist with ensuring that these guidelines are followed so that this test can be completed as efficiently as possible to prevent further treatment delays.

## 2016-06-10 NOTE — Telephone Encounter (Addendum)
dahter called, patient unable to have pet scan due to blood sugar 335 or 353 this am, asked when they can reschedule the patient?, also asked how we can get her sugar stabilized? Informed her patient will need to have her medical MD address that,  Will call daughter after MD or Ashlyn sees this information and will call the daughter back, patient unable to take her insulin before the pet stated by daughter

## 2016-06-10 NOTE — Addendum Note (Signed)
Addended by: Pricilla Holm A on: 06/10/2016 10:18 AM   Modules accepted: Orders

## 2016-06-14 ENCOUNTER — Ambulatory Visit (HOSPITAL_COMMUNITY)
Admission: RE | Admit: 2016-06-14 | Discharge: 2016-06-14 | Disposition: A | Discharge status: Home / Self Care | Payer: PPO | Source: Ambulatory Visit | Attending: Radiation Oncology | Admitting: Radiation Oncology

## 2016-06-14 DIAGNOSIS — J439 Emphysema, unspecified: Secondary | ICD-10-CM | POA: Diagnosis not present

## 2016-06-14 DIAGNOSIS — I2510 Atherosclerotic heart disease of native coronary artery without angina pectoris: Secondary | ICD-10-CM | POA: Diagnosis not present

## 2016-06-14 DIAGNOSIS — C250 Malignant neoplasm of head of pancreas: Secondary | ICD-10-CM | POA: Insufficient documentation

## 2016-06-14 DIAGNOSIS — C259 Malignant neoplasm of pancreas, unspecified: Secondary | ICD-10-CM | POA: Diagnosis not present

## 2016-06-14 DIAGNOSIS — I700 Atherosclerosis of aorta: Secondary | ICD-10-CM | POA: Diagnosis not present

## 2016-06-14 LAB — GLUCOSE, CAPILLARY: Glucose-Capillary: 217 mg/dL — ABNORMAL HIGH (ref 65–99)

## 2016-06-14 MED ORDER — FLUDEOXYGLUCOSE F - 18 (FDG) INJECTION
12.2000 | Freq: Once | INTRAVENOUS | Status: AC
  Administered 2016-06-14: 12.2 via INTRAVENOUS

## 2016-06-15 ENCOUNTER — Telehealth: Payer: Self-pay | Admitting: Radiation Oncology

## 2016-06-15 NOTE — Telephone Encounter (Signed)
Pt's friend Ms. Moore called. I tried to call her back but couldn't reach her by phone, and no voicemail set up.

## 2016-06-15 NOTE — Telephone Encounter (Signed)
-----   Message from Mallie Darting sent at 06/15/2016  2:44 PM EDT ----- Regarding: Pt. called Mariann Laster took a call for the patient Melissa Myers and the patient gave permission for our office to speak with Arnell Sieving ph# [phone]. The note states that the patient is hard of hearing. Ms Laurance Flatten wanted to speak with the person that had spoken with Ms. Stumpp earlier today.  Thank you,  Shelle Iron [phone]

## 2016-06-15 NOTE — Telephone Encounter (Signed)
The patient was called to review PET results. She had her biliary drain out last Thursday with Dr. Laurence Ferrari. She is interested in still proceeding with 5 fractions of SBRT. We will have our staff reach out to her about setting up simulation in the next week or so.

## 2016-06-16 ENCOUNTER — Other Ambulatory Visit: Payer: PPO

## 2016-06-16 ENCOUNTER — Other Ambulatory Visit: Payer: Self-pay | Admitting: Internal Medicine

## 2016-06-16 MED ORDER — HYDROCODONE-ACETAMINOPHEN 10-325 MG PO TABS: 1.0000 | ORAL_TABLET | Freq: Four times a day (QID) | ORAL | 0 refills | Status: DC | PRN

## 2016-06-17 ENCOUNTER — Telehealth (HOSPITAL_COMMUNITY): Payer: Self-pay | Admitting: Cardiology

## 2016-06-17 MED ORDER — POTASSIUM CHLORIDE CRYS ER 20 MEQ PO TBCR: 40.0000 meq | EXTENDED_RELEASE_TABLET | Freq: Two times a day (BID) | ORAL | 3 refills | Status: DC

## 2016-06-17 MED ORDER — TORSEMIDE 100 MG PO TABS: 50.0000 mg | ORAL_TABLET | ORAL | 3 refills | Status: DC

## 2016-06-17 MED ORDER — SPIRONOLACTONE 25 MG PO TABS: 25.0000 mg | ORAL_TABLET | Freq: Every day | ORAL | 3 refills | Status: AC

## 2016-06-17 NOTE — Telephone Encounter (Signed)
[facility] nurse called with concerns regarding increased weight. Reports weight was 221 after discharge and SNF stay, patient was stable and looked very good at this weight.  CHF providers ordered TORSEMIDE 100 mg , with an additional 100 mg in the PM prn in the past however she was discharged on LASIX 40 mg daily PRN  Since Monday 4/23 weight has been trending up (227 ,224,221,226)  Once weight returned to 226, [facility] team gave patient mg of torsemide. patient is stable , mild edema, labs stable   Do we do anything further or wait until follow up on 5/1?

## 2016-06-17 NOTE — Telephone Encounter (Signed)
She had been on torsemide 100 mg bid + spironolactone 25 daily + KCl 60 bid.  Would have her take torsemide 100 qam/50 qpm for now with spironolactone 25 daily and KCl 40 bid.  Needs followup in our office.

## 2016-06-17 NOTE — Telephone Encounter (Signed)
Con Memos, RN with [facility] aware of med changes. Patient is already scheduled for follow up on 5/1

## 2016-06-21 ENCOUNTER — Ambulatory Visit (HOSPITAL_COMMUNITY)
Admission: RE | Admit: 2016-06-21 | Discharge: 2016-06-21 | Disposition: A | Discharge status: Home / Self Care | Payer: PPO | Source: Ambulatory Visit | Attending: Cardiology | Admitting: Cardiology

## 2016-06-21 VITALS — BP 130/62 | HR 66 | Wt 222.4 lb

## 2016-06-21 DIAGNOSIS — G2581 Restless legs syndrome: Secondary | ICD-10-CM | POA: Insufficient documentation

## 2016-06-21 DIAGNOSIS — I878 Other specified disorders of veins: Secondary | ICD-10-CM | POA: Diagnosis not present

## 2016-06-21 DIAGNOSIS — N183 Chronic kidney disease, stage 3 (moderate): Secondary | ICD-10-CM | POA: Diagnosis not present

## 2016-06-21 DIAGNOSIS — Z811 Family history of alcohol abuse and dependence: Secondary | ICD-10-CM | POA: Insufficient documentation

## 2016-06-21 DIAGNOSIS — E781 Pure hyperglyceridemia: Secondary | ICD-10-CM | POA: Diagnosis not present

## 2016-06-21 DIAGNOSIS — Z800 Family history of malignant neoplasm of digestive organs: Secondary | ICD-10-CM | POA: Insufficient documentation

## 2016-06-21 DIAGNOSIS — Z8507 Personal history of malignant neoplasm of pancreas: Secondary | ICD-10-CM | POA: Diagnosis not present

## 2016-06-21 DIAGNOSIS — Z794 Long term (current) use of insulin: Secondary | ICD-10-CM | POA: Diagnosis not present

## 2016-06-21 DIAGNOSIS — I872 Venous insufficiency (chronic) (peripheral): Secondary | ICD-10-CM | POA: Insufficient documentation

## 2016-06-21 DIAGNOSIS — E669 Obesity, unspecified: Secondary | ICD-10-CM | POA: Diagnosis not present

## 2016-06-21 DIAGNOSIS — I130 Hypertensive heart and chronic kidney disease with heart failure and stage 1 through stage 4 chronic kidney disease, or unspecified chronic kidney disease: Secondary | ICD-10-CM | POA: Insufficient documentation

## 2016-06-21 DIAGNOSIS — E1122 Type 2 diabetes mellitus with diabetic chronic kidney disease: Secondary | ICD-10-CM | POA: Diagnosis not present

## 2016-06-21 DIAGNOSIS — Z7982 Long term (current) use of aspirin: Secondary | ICD-10-CM | POA: Insufficient documentation

## 2016-06-21 DIAGNOSIS — E1142 Type 2 diabetes mellitus with diabetic polyneuropathy: Secondary | ICD-10-CM | POA: Insufficient documentation

## 2016-06-21 DIAGNOSIS — G4733 Obstructive sleep apnea (adult) (pediatric): Secondary | ICD-10-CM | POA: Insufficient documentation

## 2016-06-21 DIAGNOSIS — C259 Malignant neoplasm of pancreas, unspecified: Secondary | ICD-10-CM | POA: Insufficient documentation

## 2016-06-21 DIAGNOSIS — Z833 Family history of diabetes mellitus: Secondary | ICD-10-CM | POA: Diagnosis not present

## 2016-06-21 DIAGNOSIS — I10 Essential (primary) hypertension: Secondary | ICD-10-CM

## 2016-06-21 DIAGNOSIS — G6289 Other specified polyneuropathies: Secondary | ICD-10-CM

## 2016-06-21 DIAGNOSIS — Z79899 Other long term (current) drug therapy: Secondary | ICD-10-CM | POA: Diagnosis not present

## 2016-06-21 DIAGNOSIS — C250 Malignant neoplasm of head of pancreas: Secondary | ICD-10-CM | POA: Diagnosis not present

## 2016-06-21 DIAGNOSIS — E785 Hyperlipidemia, unspecified: Secondary | ICD-10-CM | POA: Diagnosis not present

## 2016-06-21 DIAGNOSIS — Z8249 Family history of ischemic heart disease and other diseases of the circulatory system: Secondary | ICD-10-CM | POA: Insufficient documentation

## 2016-06-21 DIAGNOSIS — R011 Cardiac murmur, unspecified: Secondary | ICD-10-CM | POA: Diagnosis not present

## 2016-06-21 DIAGNOSIS — I5032 Chronic diastolic (congestive) heart failure: Secondary | ICD-10-CM | POA: Diagnosis not present

## 2016-06-21 DIAGNOSIS — Z801 Family history of malignant neoplasm of trachea, bronchus and lung: Secondary | ICD-10-CM | POA: Diagnosis not present

## 2016-06-21 LAB — BASIC METABOLIC PANEL
Anion gap: 11 (ref 5–15)
BUN: 48 mg/dL — AB (ref 6–20)
CALCIUM: 9.2 mg/dL (ref 8.9–10.3)
CHLORIDE: 95 mmol/L — AB (ref 101–111)
CO2: 30 mmol/L (ref 22–32)
CREATININE: 1.22 mg/dL — AB (ref 0.44–1.00)
GFR calc Af Amer: 51 mL/min — ABNORMAL LOW (ref >60)
GFR calc non Af Amer: 44 mL/min — ABNORMAL LOW (ref >60)
GLUCOSE: 308 mg/dL — AB (ref 65–99)
Potassium: 4.3 mmol/L (ref 3.5–5.1)
Sodium: 136 mmol/L (ref 135–145)

## 2016-06-21 LAB — BRAIN NATRIURETIC PEPTIDE: B Natriuretic Peptide: 61.9 pg/mL (ref 0.0–100.0)

## 2016-06-21 NOTE — Progress Notes (Signed)
Patient ID: Melissa Myers, female   DOB: Jan 06, 1946, 71 y.o.   MRN: [id number]   PCP: Dr. Sharlet Salina Endocrinologist: Dr Loanne Drilling Cardiology: Dr Aundra Dubin.   71 y.o. with history of HTN, DM, hyperlipidemia, OHS/OSA, and chronic dyspnea/diastolic CHF.  Patient had an echo in 04/2009 showing moderate LVH and preserved LV systolic function. However, there was a very large LV mid-cavity gradient with valsalva.  Patient developed quite significant exertional dyspnea to the point where she was short of breath walking around her house. She had a pulmonary evaluation with Dr. Elsworth Soho but no primary lung problems were identified. Patient had an ETT-myoview in 05/2009 which was negative for ischemia or infarction. Right heart cath in 05/2009 showed mildly elevated right heart filling pressures but normal PA pressure and normal PCWP. She was started her on a beta blocker (Coreg) to try to lower her LV mid-cavity gradient (that likely occurs with exertion) and to better control BP.  V/Q scan was negative for PE.  PFTs from 06/2011 showed a restrictive defect. Last echo in 11/2013 showed EF 65% with normal RV size and systolic function.      Admitted 12/05/13 with volume overload. Diuresed with IV lasix and transitioned to torsemide 80 mg twice a day + metolazone. Overall she diuresed 21 pounds. Discharge weight was 263 pounds.   She was admitted in 9/16 with Strep agalactiae sepsis likely from cellulitis.    Admitted 12/1 though 01/26/15 with marked volume overload and cellulitis. Diuresed with Lasix drip and placed on antibiotics. Overall diuresed 20 pounds.   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  Most of leg pain is likely due to neuropathy.    She presents today for HF follow up. Last seen in HF clinic 71/2017. Since, has been diagnosed with pancreatic cancer, and is not a good surgical candidate. Had PET scan 06/14/16 with no clear metastases.  Breathing has been OK. Spent some time in SNF and they switched her to lasix for a days. Now back on torsemide. Weight down from 230 to 223 with change. Denies orthopnea or PND. No SOB getting around the house, but limited by her feet with her neuropathy. Denies CP. Denies lightheadedness or dizziness.   Labs (10/13): K 4.1, creatinine 0.85 Labs (4/14): K 3.7, creatinine 1.2, BUN 43, BNP 23 Labs (5/14): K 4.1, creatinine 1.0 Labs (7/14): K 4.6 Creatinine 1.0 BNP 39.0  Labs (8/14): K 3.5, creatinine 0.91, BNP 71 Labs (11/14): K 3.7, creatinine 0.9 Labs (12/14): K 4.1, creatinine 0.8, BNP 52 Labs (3/15): K 4, creatinine 0.9 Labs (4/15): K 3.7, creatinine 0.9 Labs (5/15): K 3.8, creatinine 0.9, BNP 29 Labs (6/15): K 3.4, BUN 105, creatinine 0.9 =>1.7 Labs (6/17/5) K 4.2, BUN 41, Creatinine 0.80 Labs (08/19/13) K 3.2, BUN 71, creatinine 1.5 Labs 09/05/13 K 4.1 Creatinine 1.1 Labs 11/07/13 K 3.6 Creatinine 0.91 Labs 12/11/13 K 4.5 Creatine 1.05 Labs (12/15): K 3.8, creatinine 1.03 Labs (03/17/2014): K 4.1 Creatinine 0.99  Labs (4/16): K 3.7, creatinine 1.24 Labs (5/16): LDL 108, TGs 699 Labs (07/24/2014) K 3.5 Creatinine 1.17  Labs (07/31/2014) K 3.3 Creatinine 1.13 Labs (9/16): K 3.5, creatinine 0.93 Labs (01/26/2015) K 3.6 Creatinine 1.36  Labs 04/22/15 K 3.8, Creatinine 1.66 Labs (4/17): K 4.6, creatinine 1.74, HCT 37.9 Labs (9/17): K 2.8, creatinine 1.67, glucose 505, BNP 20, hgb 12.4 Labs (11/19/2015) : K 3.2 Creatinine 2.25  Labs (11/27/2015): K 4.4 Creatinine 1.26  Labs (11/17): BNP  [place] (12/17): K 3.9, creatinine 1.49, hgbA1c 14.1  Allergies (verified):  1) ! Sulfa  2) Morphine   Past Medical History:  1. Diabetes mellitus, type II  2. Hyperlipidemia: She has been unable to tolerate statins (has been on Vytorin, lovastatin, and Crestor) due to what sounds like rhabdo: developed muscle weakness and was told she had "muscle damage" with each of these statins. She has been told not to  take statins anymore.  Has hypertriglyceridemia.  3. Hypertension 4. Obesity  5. Restless Leg Syndrome  6. OA  7. Diabetic peripheral neuropathy  8. OHS/OSA: Uses supplemental oxygen, does not tolerate CPAP.   9. Chronic nausea/diarrhea: ? IBS  10. Diastolic CHF: Echo (1/28) with normal LV size, moderate LVH, EF 65-70%, LV mid-cavity gradient reaching 63 mmHg with valsalva but minimal at rest, grade I diastolic dysfunction, cannot estimate PA systolic pressure (no TR doppler signal), RV normal. RHC (4/11): Mean RA 11, RV 37/13, PA 33/15, mean PCWP 12, CI 3.3. Echo in 11/12 showed mild LVH, EF 65-70%, no LV mid-cavity gradient was mentioned.  Echo in 4/14 showed EF 65-70%, mild LVH, grade I diastolic dysfunction, PA systolic pressure 35 mmHg, mild LV mid-cavity gradient.  Echo (5/15) with EF 60-65%, mild TR, normal RV size and systolic function. Echo (10/15) with EF 65%, moderate LVH, normal RV.  - ECHO 11/30/15 LVEF 65-70%, Grade 1 DD.  11. ETT-myoview (4/11): 5'30", stopped due to fatigue, normal EF, no evidence for scar or ischemia. 12. V/Q scan 11/12: negative for PE.  Lower extremity venous doppler US (3/14): negative for DVT.  13. PFTs (5/13): FVC 50%, FEV1 62%, ratio 106%, DLCO 69%, TLC 69%.  36. CKD  Family History:  Family History of Alcoholism/Addiction (parent)  Family History Diabetes 1st degree relative (grandparent)  Family History High cholesterol (parent, grandparent)  Family History Hypertension (parent, grandparent)  Family History Lung cancer (grandparent)  Stomach cancer (grandmother)  Celiac Sprue daughter  Mother with MI at 60, father with MI at 18, uncle with MI at 67, brother with stents in his 66s, multiple aunts with MIs and CVAs   Social History:  Never Smoked  no alcohol  married, lives with spouse and her mother  retired Network engineer Alcohol Use - no  Illicit Drug Use - no  Review of Systems  All systems reviewed and negative except as per HPI.   ROS: All  systems reviewed and negative except as per HPI.   Current Outpatient Prescriptions  Medication Sig Dispense Refill  . aspirin EC 81 MG tablet Take 1 tablet (81 mg total) by mouth daily.    . carvedilol (COREG) 25 MG tablet TAKE 1 TABLET BY MOUTH TWICE DAILY WITH FOOD 60 tablet 5  . Cholecalciferol (VITAMIN D3) 2000 units capsule Take 1 capsule (2,000 Units total) by mouth daily. 90 capsule 3  . citalopram (CELEXA) 20 MG tablet TAKE 1 TABLET (20 MG TOTAL) BY MOUTH DAILY. 30 tablet 1  . DULoxetine (CYMBALTA) 60 MG capsule Take 1 capsule (60 mg total) by mouth daily. 90 capsule 1  . ezetimibe (ZETIA) 10 MG tablet TAKE ONE TABLET BY MOUTH DAILY 90 tablet 0  . gabapentin (NEURONTIN) 300 MG capsule TAKE TWO CAPSULES BY MOUTH FOUR TIMES A DAY 240 capsule 1  . hydrALAZINE (APRESOLINE) 25 MG tablet TAKE 1 TABLET (25 MG TOTAL) BY MOUTH 3 (THREE) TIMES DAILY. 90 tablet 1  . HYDROcodone-acetaminophen (NORCO) 10-325 MG tablet Take 1 tablet by mouth every 6 (six) hours as needed (  pain). 90 tablet 0  . insulin aspart (NOVOLOG) 100 UNIT/ML injection Inject 0-20 Units into the skin 3 (three) times daily with meals. . CBG 70 - 120: 0 units CBG 121 - 150: 3 units CBG 151 - 200: 4 units CBG 201 - 250: 7 units CBG 251 - 300: 11 units CBG 301 - 350: 15 units CBG 351 - 400: 20 units    . insulin glargine (LANTUS) 100 UNIT/ML injection Inject 0.3 mLs (30 Units total) into the skin daily.    [name] lactulose (CHRONULAC) 10 GM/15ML solution Take 10 g by mouth daily as needed for mild constipation.    [name] LYRICA 200 MG capsule TAKE ONE CAPSULE BY MOUTH TWICE A DAY 60 capsule 0  . metFORMIN (GLUCOPHAGE) 500 MG tablet Take 500 mg by mouth daily with breakfast.    . potassium chloride SA (K-DUR,KLOR-CON) 20 MEQ tablet Take 2 tablets (40 mEq total) by mouth 2 (two) times daily. 120 tablet 3  . rOPINIRole (REQUIP) 2 MG tablet Take 2 mg by mouth 2 (two) times daily.    [name] spironolactone (ALDACTONE) 25 MG tablet Take 1 tablet (25  mg total) by mouth daily. 90 tablet 3  . torsemide (DEMADEX) 100 MG tablet Take 0.5-1 tablets (50-100 mg total) by mouth as directed. Take one tab (100 mg) in the AM and take one half tab (50 mg) in the PM 45 tablet 3   No current facility-administered medications for this encounter.    Vitals:   06/21/16 1509  BP: 130/62  Pulse: 66  SpO2: 93%  Weight: 222 lb 6.4 oz (100.9 kg)   Wt Readings from Last 3 Encounters:  06/21/16 222 lb 6.4 oz (100.9 kg)  06/01/16 230 lb (104.3 kg)  05/19/16 224 lb 13.9 oz (102 kg)    General: Obese. NAD. Husband and caretaker/friend present. IN WC.  HEENT: Normal. Neck: Thick. JVP 6-7 sm. Carotids 2+ bilat; no bruits. No thyromegaly or nodule noted. Cor: PMI nondisplaced. RRR, 2/6 SEM.  Lungs: CTAB, normal effort. Abdomen: soft, non-tender, distended, no HSM. No bruits or masses. +BS  Extremities: no cyanosis or clubbing. Bilateral chronic venous stasis changes.  Neuro: alert & orientedx3, cranial nerves grossly intact. moves all 4 extremities w/o difficulty. Affect pleasant   Assessment/Plan: 1. Chronic diastolic CHF:   - NYHA class III symptoms but severely limited by neuropathy. Volume status looks OK on exam. - Continue torsemide 100 mg q am and 50 mg q pm. Can take extra 50 mg q pm as needed.  - Continue potassium 40 meq BID for now. BMET today.   - Continue spiro 25 mg daily.  - With ? Prognosis, cannot consider for cardiomems at this time. Of note, seems to be doing much better from cardiac perspective with help at home every day.  2. OHS:  Followed by Dr Elsworth Soho, has previously stated no role for CPAP.  - Continue home 02.  3. Obesity:  - Needs to watch portions and increase activity as able.    4. HTN:  - BP stable on current regimen.  5. Hyperlipidemia:   - TGs and LDLs high in December. Stressed compliance.   6. Diabetic peripheral neuropathy:   - Makes walking difficult and functional status hard to judge.   - Per PCP.  7. CKD stage  III - BMET today.  8. PAD: Bilateral iliac disease on aortoiliac dopplers in 7/17.   - No change in current plan for medical management with Dr. Fletcher Anon.  9.  Murmur: Has had vigorous LV contraction with LV mid-cavity gradient in the past (is on Coreg).  No change.  10. Venous stasis dermatitis:  - Stable. Per PCP.  11. Pancreatic cancer - Diagnosed earlier this year. Seeing Dr. Lisbeth Renshaw and Dr. Barry Dienes. Thought to be poor surgical candidate this far.  - Work up ongoing. Prognosis unclear.   Will await further pancreatic CA work up prior to considering for cardiomems.    Myleen Friar, PA-C  06/21/2016   Greater than 50% of the 25 minute visit was spent in counseling/coordination of care regarding disease state education, sliding scale diuretics, and fluid/salt restriction.

## 2016-06-21 NOTE — Patient Instructions (Signed)
Labs today (will call for abnormal results, otherwise no news is good news)  Follow up in 3 Months with Dr. Bensimhon 

## 2016-06-21 NOTE — Progress Notes (Signed)
Advanced Heart Failure Medication Review by a Pharmacist  Does the patient  feel that his/her medications are working for him/her?  yes  Has the patient been experiencing any side effects to the medications prescribed?  no  Does the patient measure his/her own blood pressure or blood glucose at home?  yes   Does the patient have any problems obtaining medications due to transportation or finances?   no  Understanding of regimen: good Understanding of indications: good Potential of compliance: good Patient understands to avoid NSAIDs. Patient understands to avoid decongestants.  Issues to address at subsequent visits: None   Pharmacist comments: Melissa Myers is a pleasant 71 yo F presenting with her husband and neighbor of 39 years who helps her manage her medications. She reports excellent compliance with her regimen and did not have any specific medication-related questions or concerns for me at this time.   [name]. Velva Harman, PharmD, BCPS, CPP Clinical Pharmacist Pager: [phone] Phone: [phone] 06/21/2016 3:39 PM      Time with patient: 10 minutes Preparation and documentation time: 2 minutes Total time: 12 minutes

## 2016-06-22 ENCOUNTER — Ambulatory Visit
Admission: RE | Admit: 2016-06-22 | Discharge: 2016-06-22 | Disposition: A | Discharge status: Home / Self Care | Payer: PPO | Source: Ambulatory Visit | Attending: Radiation Oncology | Admitting: Radiation Oncology

## 2016-06-22 VITALS — Resp 20 | Wt 221.0 lb

## 2016-06-22 DIAGNOSIS — C250 Malignant neoplasm of head of pancreas: Secondary | ICD-10-CM | POA: Diagnosis not present

## 2016-06-22 DIAGNOSIS — Z510 Encounter for antineoplastic radiation therapy: Secondary | ICD-10-CM | POA: Diagnosis not present

## 2016-06-22 MED ORDER — SODIUM CHLORIDE 0.9% FLUSH
10.0000 mL | Freq: Once | INTRAVENOUS | Status: AC
Start: 2016-06-22 — End: 2016-06-22
  Administered 2016-06-22: 10 mL via INTRAVENOUS

## 2016-06-22 NOTE — Progress Notes (Addendum)
Has armband been applied?  yes  Does patient have an allergy to IV contrast dye?: NO   Has patient ever received premedication for IV contrast dye?: NO  Does patient take metformin?: Yes  If patient does take metformin when was the last dose: 06/21/16  Date of lab work:  BUN: 48 CR: 1.22 Ok to obtain IV start per Dr. Tammi Klippel after looking at lab results IV site:LAC, 22g  Catheter needle, 1st attempt RAC, by J.Gracin Soohoo,RN,  blood return but wouldn't flush, 2nd IV attempt done by Guadelupe Sabin, RN, successful,patient tolerate well  Has IV site been added to flowsheet?  Yes  221.0lb weight  Gave discharge sheet on metformin, do not take until after lab work on Friday, at 63 am,  will call you if okay to take then, also told neighbor no metformin till after  Lab on Friday, both gave verbal understanding, 11:42 AM

## 2016-06-24 ENCOUNTER — Ambulatory Visit: Admission: RE | Admit: 2016-06-24 | Payer: PPO | Source: Ambulatory Visit

## 2016-06-24 ENCOUNTER — Encounter: Payer: Self-pay | Admitting: *Deleted

## 2016-06-24 ENCOUNTER — Ambulatory Visit (INDEPENDENT_AMBULATORY_CARE_PROVIDER_SITE_OTHER): Payer: PPO | Admitting: Podiatry

## 2016-06-24 DIAGNOSIS — M79605 Pain in left leg: Secondary | ICD-10-CM

## 2016-06-24 DIAGNOSIS — M79604 Pain in right leg: Secondary | ICD-10-CM

## 2016-06-24 DIAGNOSIS — M779 Enthesopathy, unspecified: Secondary | ICD-10-CM

## 2016-06-24 DIAGNOSIS — B351 Tinea unguium: Secondary | ICD-10-CM | POA: Diagnosis not present

## 2016-06-24 MED ORDER — TRIAMCINOLONE ACETONIDE 10 MG/ML IJ SUSP
10.0000 mg | Freq: Once | INTRAMUSCULAR | Status: AC
  Administered 2016-06-24: 10 mg

## 2016-06-24 NOTE — Progress Notes (Signed)
36 Spoke with Mrs. Roberston she will restart her Metformin per [employer] Labs were reviewed from 06-30-16 there was no repeat of labs per Dr. Lisbeth Renshaw for 06-24-16.

## 2016-06-25 NOTE — Progress Notes (Signed)
Subjective:    Patient ID: Melissa Myers, female   DOB: 71 y.o.   MRN: [id number]   HPI patient presents with chronic nail disease and swelling that is difficult with her mother tried always cut her nails that become ingrown and sore and patient has history of diabetes and presents with caregiver today. Also complains of pain around the right forefoot fourth metatarsal    ROS      Objective:  Physical Exam neurovascular status unchanged with negative Homans sign noted and moderate forefoot edema that she controls herself with fluid pills elevation and compression with inflammation around the fourth and third MPJ right and nail disease 1-5 both feet with incurvation of the beds and pain when palpated     Assessment:   Inflammatory capsulitis fourth MPJ chronic swelling which is controlled by Dr. and I encouraged her to make an appointment with him to reevaluate with nail disease and pain 1-5 both feet      Plan:   Neurovascular status intact with patient and reviewed at this time and I went ahead and injected the fourth MPJ right 3 mg [place] some Kenalog and debrided nailbeds 1-5 both feet with no iatrogenic bleeding noted

## 2016-06-28 ENCOUNTER — Other Ambulatory Visit (HOSPITAL_COMMUNITY): Payer: Self-pay | Admitting: Cardiology

## 2016-06-28 DIAGNOSIS — I509 Heart failure, unspecified: Secondary | ICD-10-CM

## 2016-06-29 DIAGNOSIS — R600 Localized edema: Secondary | ICD-10-CM | POA: Diagnosis not present

## 2016-06-29 DIAGNOSIS — M25562 Pain in left knee: Secondary | ICD-10-CM | POA: Diagnosis not present

## 2016-06-29 DIAGNOSIS — E43 Unspecified severe protein-calorie malnutrition: Secondary | ICD-10-CM | POA: Diagnosis not present

## 2016-06-29 DIAGNOSIS — D638 Anemia in other chronic diseases classified elsewhere: Secondary | ICD-10-CM | POA: Diagnosis not present

## 2016-06-29 DIAGNOSIS — C250 Malignant neoplasm of head of pancreas: Secondary | ICD-10-CM | POA: Diagnosis not present

## 2016-06-29 DIAGNOSIS — M25561 Pain in right knee: Secondary | ICD-10-CM | POA: Diagnosis not present

## 2016-06-29 DIAGNOSIS — I5032 Chronic diastolic (congestive) heart failure: Secondary | ICD-10-CM | POA: Diagnosis not present

## 2016-06-29 DIAGNOSIS — G8929 Other chronic pain: Secondary | ICD-10-CM | POA: Diagnosis not present

## 2016-06-29 DIAGNOSIS — I872 Venous insufficiency (chronic) (peripheral): Secondary | ICD-10-CM | POA: Diagnosis not present

## 2016-07-07 NOTE — Progress Notes (Signed)
[facility] Simulation and Treatment Planning Note   Name:  Melissa Myers MRN: [id number]   Date: 07/07/2016  DOB: 08-03-1945  Status:outpatient    DIAGNOSIS:    ICD-9-CM ICD-10-CM   1. Malignant neoplasm of head of pancreas ([place]) 157.0 C25.0      CONSENT VERIFIED:yes   SET UP: Patient is setup supine   IMMOBILIZATION: The patient was immobilized using a Vac Loc bag and Abdominal Compression.   NARRATIVE:The patient was brought to the [place].  Identity was confirmed.  All relevant records and images related to the planned course of therapy were reviewed.  Then, the patient was positioned in a stable reproducible clinical set-up for radiation therapy. Abdominal compression was applied by me.  4D CT images were obtained and reproducible breathing pattern was confirmed. Free breathing CT images were obtained.  Skin markings were placed.  The CT images were loaded into the planning software where the target and avoidance structures were contoured.  The radiation prescription was entered and confirmed.    TREATMENT PLANNING NOTE:  Treatment planning then occurred. I have requested : MLC's, isodose plan, basic dose calculation.  3 dimensional simulation is performed and dose volume histogram of the gross tumor volume, planning tumor volume and criticial normal structures including the spinal cord and lungs were analyzed and requested.  Special treatment procedure was performed due to high dose per fraction.  The patient will be monitored for increased risk of toxicity.  Daily imaging using cone beam CT will be used for target localization.  I anticipate that the patient will receive 33 Gy in 5 fractions to target volume. Further adjustments will be made based on the planning process is necessary.  ------------------------------------------------  Jodelle Gross, MD, PhD

## 2016-07-08 ENCOUNTER — Telehealth: Payer: Self-pay | Admitting: Medical Oncology

## 2016-07-08 DIAGNOSIS — Z510 Encounter for antineoplastic radiation therapy: Secondary | ICD-10-CM | POA: Diagnosis not present

## 2016-07-08 DIAGNOSIS — C250 Malignant neoplasm of head of pancreas: Secondary | ICD-10-CM | POA: Diagnosis not present

## 2016-07-08 NOTE — Telephone Encounter (Signed)
confirmed appt for 5/22.

## 2016-07-11 ENCOUNTER — Ambulatory Visit: Payer: PPO | Admitting: Radiation Oncology

## 2016-07-11 ENCOUNTER — Ambulatory Visit
Admission: RE | Admit: 2016-07-11 | Discharge: 2016-07-11 | Disposition: A | Discharge status: Home / Self Care | Payer: PPO | Source: Ambulatory Visit | Attending: Radiation Oncology | Admitting: Radiation Oncology

## 2016-07-11 ENCOUNTER — Other Ambulatory Visit: Payer: Self-pay | Admitting: Internal Medicine

## 2016-07-11 DIAGNOSIS — Z510 Encounter for antineoplastic radiation therapy: Secondary | ICD-10-CM | POA: Diagnosis not present

## 2016-07-12 ENCOUNTER — Ambulatory Visit: Payer: PPO | Admitting: Radiation Oncology

## 2016-07-12 ENCOUNTER — Ambulatory Visit: Payer: PPO

## 2016-07-12 ENCOUNTER — Ambulatory Visit: Payer: PPO | Admitting: Podiatry

## 2016-07-12 NOTE — Progress Notes (Signed)
Nutrition Assessment   Reason for Assessment:   Referral from Dr. Barry Dienes   ASSESSMENT:  71 year old female with new diagnosis of pancreatic cancer. Patient received first radiation treatment yesterday.  Planning radiation only at this time as patient not a surgical candidate at this time. Past medical history of DM, CHF, HLD, HTN  Patient comes to clinic today in wheelchair with caregiver.  Reports good appetite. Typical intake in the morning is cereal with skim milk or grits and unsalted butter, sometimes scrambled eggs. For lunch has meat (chicken, [place] or beef) baked and vegetables. Supper meal is similar to lunch time meal with meat and vegetables (reports having spaghetti tonight for dinner).  Does not like the "milky" supplements.  Planning to start PT soon per patient  Nutrition Focused Physical Exam: deferred  Medications: Vit D3, aspart, lantus, metformin, KCL, demedex  Labs: glucose 308, BUN 48, creatinine 1.22  Anthropometrics:   Height: 62 inches Weight: 221 lb (fluid weight gain has been problem previously) BMI: 40   NUTRITION DIAGNOSIS: Food and nutrition related knowledge deficit related to cancer and chronic conditions as evidenced by patient with questions regarding diet   INTERVENTION:   Discussed and encouraged good sources of protein (lean meats, dried beans, nut butters, etc). Caregiver with concerns regarding beans due to making blood glucose increase Discussed importance of patient to continue to receive nutrient dense carbohydrates (100% whole grains, dried beans, fruit, vegetables) vs refined products.  Caregiver does not want patient to eat any fruit. Fact sheet given on blood glucose control with cancer. Discussed heart healthy nutrition therapy as well.   Caregiver reported that she has ordered patient boost breeze supplement drink as she does not like "milky" supplements.  Samples given today of ensure clear and boost breeze to try.    MONITORING,  EVALUATION, GOAL: Patient will consume nutrient dense foods to improve overall nutrition and maintain lean muscle mass   NEXT VISIT: as needed  Zackry Deines B. Zenia Resides, [place], [place] Registered Dietitian [phone] (pager)

## 2016-07-13 ENCOUNTER — Ambulatory Visit
Admission: RE | Admit: 2016-07-13 | Discharge: 2016-07-13 | Disposition: A | Discharge status: Home / Self Care | Payer: PPO | Source: Ambulatory Visit | Attending: Radiation Oncology | Admitting: Radiation Oncology

## 2016-07-13 DIAGNOSIS — Z510 Encounter for antineoplastic radiation therapy: Secondary | ICD-10-CM | POA: Diagnosis not present

## 2016-07-14 ENCOUNTER — Ambulatory Visit: Payer: PPO | Admitting: Radiation Oncology

## 2016-07-15 ENCOUNTER — Ambulatory Visit
Admission: RE | Admit: 2016-07-15 | Discharge: 2016-07-15 | Disposition: A | Discharge status: Home / Self Care | Payer: PPO | Source: Ambulatory Visit | Attending: Radiation Oncology | Admitting: Radiation Oncology

## 2016-07-15 DIAGNOSIS — Z510 Encounter for antineoplastic radiation therapy: Secondary | ICD-10-CM | POA: Diagnosis not present

## 2016-07-18 ENCOUNTER — Ambulatory Visit: Payer: PPO | Admitting: Radiation Oncology

## 2016-07-19 ENCOUNTER — Ambulatory Visit: Payer: PPO | Admitting: Radiation Oncology

## 2016-07-19 ENCOUNTER — Other Ambulatory Visit (HOSPITAL_COMMUNITY): Payer: Self-pay | Admitting: Internal Medicine

## 2016-07-19 ENCOUNTER — Ambulatory Visit
Admission: RE | Admit: 2016-07-19 | Discharge: 2016-07-19 | Disposition: A | Discharge status: Home / Self Care | Payer: PPO | Source: Ambulatory Visit | Attending: Radiation Oncology | Admitting: Radiation Oncology

## 2016-07-19 DIAGNOSIS — Z510 Encounter for antineoplastic radiation therapy: Secondary | ICD-10-CM | POA: Diagnosis not present

## 2016-07-20 ENCOUNTER — Ambulatory Visit: Payer: PPO | Admitting: Radiation Oncology

## 2016-07-21 ENCOUNTER — Ambulatory Visit: Payer: PPO | Admitting: Radiation Oncology

## 2016-07-21 ENCOUNTER — Ambulatory Visit
Admission: RE | Admit: 2016-07-21 | Discharge: 2016-07-21 | Disposition: A | Discharge status: Home / Self Care | Payer: PPO | Source: Ambulatory Visit | Attending: Radiation Oncology | Admitting: Radiation Oncology

## 2016-07-21 ENCOUNTER — Encounter: Payer: Self-pay | Admitting: Radiation Oncology

## 2016-07-21 DIAGNOSIS — C250 Malignant neoplasm of head of pancreas: Secondary | ICD-10-CM | POA: Diagnosis not present

## 2016-07-21 DIAGNOSIS — Z510 Encounter for antineoplastic radiation therapy: Secondary | ICD-10-CM | POA: Diagnosis not present

## 2016-07-22 ENCOUNTER — Ambulatory Visit: Admission: RE | Admit: 2016-07-22 | Payer: PPO | Source: Ambulatory Visit | Admitting: Radiation Oncology

## 2016-07-25 NOTE — Addendum Note (Signed)
Addendum  created 07/25/16 1025 by Myrtie Soman, MD   Sign clinical note

## 2016-07-25 NOTE — Anesthesia Postprocedure Evaluation (Signed)
Anesthesia Post Note  Patient: Melissa Myers  Procedure(s) Performed: Procedure(s) (LRB): ENDOSCOPIC RETROGRADE CHOLANGIOPANCREATOGRAPHY (ERCP) (N/A)     Anesthesia Post Evaluation  Last Vitals:  Vitals:   03/20/16 2016 03/21/16 0507  BP: 136/62 (!) 149/60  Pulse: 65 62  Resp: 16 16  Temp: 37.1 C 37.7 C    Last Pain:  Vitals:   03/21/16 1100  TempSrc:   PainSc: 0-No pain                 Jodeen Mclin S

## 2016-07-26 NOTE — Progress Notes (Signed)
  Radiation Oncology         [phone] ________________________________  Name: Melissa FELMLEE MRN: [id number]  Date: 07/21/2016  DOB: [birth year]  End of Treatment Note  Diagnosis:   cT2N0M0  adenocarcinoma of the pancreas  Indication for treatment:  Curative  Radiation treatment dates:   07/11/16 - 07/21/16  Site/dose:   Pancreas: 33 Gy in 5 fractions  Beams/energy:   SBRT/SRT-VMAT // 10X-FFF Photon  Narrative: The patient tolerated radiation treatment relatively well. The patient complained of diarrhea during treatment. She was instructed to purchase Imodium.  Plan: The patient has completed radiation treatment. The patient will return to radiation oncology clinic for routine followup in one month. I advised them to call or return sooner if they have any questions or concerns related to their recovery or treatment.  ------------------------------------------------  Jodelle Gross, MD, PhD  This document serves as a record of services personally performed by Kyung Rudd, MD. It was created on his behalf by Darcus Austin, a trained medical scribe. The creation of this record is based on the scribe's personal observations and the provider's statements to them. This document has been checked and approved by the attending provider.

## 2016-07-27 ENCOUNTER — Ambulatory Visit: Payer: PPO | Attending: General Surgery | Admitting: Physical Therapy

## 2016-07-29 DIAGNOSIS — C259 Malignant neoplasm of pancreas, unspecified: Secondary | ICD-10-CM | POA: Diagnosis not present

## 2016-07-29 NOTE — Addendum Note (Signed)
Addendum  created 07/29/16 1100 by Lyn Hollingshead, MD   Sign clinical note

## 2016-08-02 ENCOUNTER — Telehealth: Payer: Self-pay | Admitting: *Deleted

## 2016-08-02 DIAGNOSIS — Z794 Long term (current) use of insulin: Secondary | ICD-10-CM | POA: Diagnosis not present

## 2016-08-02 DIAGNOSIS — Z6841 Body Mass Index (BMI) 40.0 and over, adult: Secondary | ICD-10-CM | POA: Diagnosis not present

## 2016-08-02 DIAGNOSIS — J432 Centrilobular emphysema: Secondary | ICD-10-CM | POA: Diagnosis not present

## 2016-08-02 DIAGNOSIS — M1990 Unspecified osteoarthritis, unspecified site: Secondary | ICD-10-CM | POA: Diagnosis not present

## 2016-08-02 DIAGNOSIS — Z7982 Long term (current) use of aspirin: Secondary | ICD-10-CM | POA: Diagnosis not present

## 2016-08-02 DIAGNOSIS — C250 Malignant neoplasm of head of pancreas: Secondary | ICD-10-CM | POA: Diagnosis not present

## 2016-08-02 DIAGNOSIS — Z79899 Other long term (current) drug therapy: Secondary | ICD-10-CM | POA: Diagnosis not present

## 2016-08-02 DIAGNOSIS — Z9049 Acquired absence of other specified parts of digestive tract: Secondary | ICD-10-CM | POA: Diagnosis not present

## 2016-08-02 DIAGNOSIS — S2231XD Fracture of one rib, right side, subsequent encounter for fracture with routine healing: Secondary | ICD-10-CM | POA: Diagnosis not present

## 2016-08-02 DIAGNOSIS — X58XXXD Exposure to other specified factors, subsequent encounter: Secondary | ICD-10-CM | POA: Diagnosis not present

## 2016-08-02 DIAGNOSIS — E1165 Type 2 diabetes mellitus with hyperglycemia: Secondary | ICD-10-CM | POA: Diagnosis not present

## 2016-08-02 DIAGNOSIS — K769 Liver disease, unspecified: Secondary | ICD-10-CM | POA: Diagnosis not present

## 2016-08-02 DIAGNOSIS — R7989 Other specified abnormal findings of blood chemistry: Secondary | ICD-10-CM | POA: Diagnosis not present

## 2016-08-02 DIAGNOSIS — Z9851 Tubal ligation status: Secondary | ICD-10-CM | POA: Diagnosis not present

## 2016-08-02 DIAGNOSIS — G4733 Obstructive sleep apnea (adult) (pediatric): Secondary | ICD-10-CM | POA: Diagnosis not present

## 2016-08-02 NOTE — Telephone Encounter (Signed)
Left msg on triage stating pt has change pharmacy to [facility], and she is needing refills on her Citalopram and Lyrica. Also will need to come pick up rx on her pain med Hydrocodone...Johny Chess

## 2016-08-03 MED ORDER — HYDROCODONE-ACETAMINOPHEN 10-325 MG PO TABS: 1.0000 | ORAL_TABLET | Freq: Four times a day (QID) | ORAL | 0 refills | Status: DC | PRN

## 2016-08-03 MED ORDER — CITALOPRAM HYDROBROMIDE 20 MG PO TABS: 20.0000 mg | ORAL_TABLET | Freq: Every day | ORAL | 1 refills | Status: DC

## 2016-08-03 MED ORDER — PREGABALIN 200 MG PO CAPS: 200.0000 mg | ORAL_CAPSULE | Freq: Two times a day (BID) | ORAL | 0 refills | Status: DC

## 2016-08-03 NOTE — Addendum Note (Signed)
Addended by: Earnstine Regal on: 08/03/2016 02:34 PM   Modules accepted: Orders

## 2016-08-03 NOTE — Telephone Encounter (Signed)
Is she taking the Celexa, Cymbalta or both? I have refilled the other medications.

## 2016-08-03 NOTE — Telephone Encounter (Signed)
Thank you. Celexa sent to pharmacy.

## 2016-08-03 NOTE — Telephone Encounter (Signed)
Called pt to verify if she is taking both. Pt states the cymbalta has been d/c due to side effects was causing itching, rash and diarrhea. Updated med list & allergy list, also inform pt rx for hydrocodone ready for pick-up.Marland KitchenJohny Chess

## 2016-08-03 NOTE — Addendum Note (Signed)
Addended by: Mauricio Po D on: 08/03/2016 02:55 PM   Modules accepted: Orders

## 2016-08-11 ENCOUNTER — Ambulatory Visit (INDEPENDENT_AMBULATORY_CARE_PROVIDER_SITE_OTHER): Payer: PPO | Admitting: Nurse Practitioner

## 2016-08-11 ENCOUNTER — Ambulatory Visit: Payer: PPO | Admitting: Physical Therapy

## 2016-08-11 ENCOUNTER — Encounter: Payer: Self-pay | Admitting: Nurse Practitioner

## 2016-08-11 VITALS — BP 100/60 | HR 62 | Temp 98.1°F | Ht 62.0 in | Wt 210.0 lb

## 2016-08-11 DIAGNOSIS — G479 Sleep disorder, unspecified: Secondary | ICD-10-CM | POA: Diagnosis not present

## 2016-08-11 DIAGNOSIS — G894 Chronic pain syndrome: Secondary | ICD-10-CM

## 2016-08-11 DIAGNOSIS — F418 Other specified anxiety disorders: Secondary | ICD-10-CM

## 2016-08-11 MED ORDER — CITALOPRAM HYDROBROMIDE 40 MG PO TABS: 40.0000 mg | ORAL_TABLET | Freq: Every day | ORAL | 1 refills | Status: DC

## 2016-08-11 NOTE — Patient Instructions (Signed)
Let me know if you change your mind about psychotherapy referral.  Insomnia Insomnia is a sleep disorder that makes it difficult to fall asleep or to stay asleep. Insomnia can cause tiredness (fatigue), low energy, difficulty concentrating, mood swings, and poor performance at work or school. There are three different ways to classify insomnia:  Difficulty falling asleep.  Difficulty staying asleep.  Waking up too early in the morning.  Any type of insomnia can be long-term (chronic) or short-term (acute). Both are common. Short-term insomnia usually lasts for three months or less. Chronic insomnia occurs at least three times a week for longer than three months. What are the causes? Insomnia may be caused by another condition, situation, or substance, such as:  Anxiety.  Certain medicines.  Gastroesophageal reflux disease (GERD) or other gastrointestinal conditions.  Asthma or other breathing conditions.  Restless legs syndrome, sleep apnea, or other sleep disorders.  Chronic pain.  Menopause. This may include hot flashes.  Stroke.  Abuse of alcohol, tobacco, or illegal drugs.  Depression.  Caffeine.  Neurological disorders, such as Alzheimer disease.  An overactive thyroid (hyperthyroidism).  The cause of insomnia may not be known. What increases the risk? Risk factors for insomnia include:  Gender. Women are more commonly affected than men.  Age. Insomnia is more common as you get older.  Stress. This may involve your professional or personal life.  Income. Insomnia is more common in people with lower income.  Lack of exercise.  Irregular work schedule or night shifts.  Traveling between different time zones.  What are the signs or symptoms? If you have insomnia, trouble falling asleep or trouble staying asleep is the main symptom. This may lead to other symptoms, such as:  Feeling fatigued.  Feeling nervous about going to sleep.  Not feeling  rested in the morning.  Having trouble concentrating.  Feeling irritable, anxious, or depressed.  How is this treated? Treatment for insomnia depends on the cause. If your insomnia is caused by an underlying condition, treatment will focus on addressing the condition. Treatment may also include:  Medicines to help you sleep.  Counseling or therapy.  Lifestyle adjustments.  Follow these instructions at home:  Take medicines only as directed by your health care provider.  Keep regular sleeping and waking hours. Avoid naps.  Keep a sleep diary to help you and your health care provider figure out what could be causing your insomnia. Include: ? When you sleep. ? When you wake up during the night. ? How well you sleep. ? How rested you feel the next day. ? Any side effects of medicines you are taking. ? What you eat and drink.  Make your bedroom a comfortable place where it is easy to fall asleep: ? Put up shades or special blackout curtains to block light from outside. ? Use a white noise machine to block noise. ? Keep the temperature cool.  Exercise regularly as directed by your health care provider. Avoid exercising right before bedtime.  Use relaxation techniques to manage stress. Ask your health care provider to suggest some techniques that may work well for you. These may include: ? Breathing exercises. ? Routines to release muscle tension. ? Visualizing peaceful scenes.  Cut back on alcohol, caffeinated beverages, and cigarettes, especially close to bedtime. These can disrupt your sleep.  Do not overeat or eat spicy foods right before bedtime. This can lead to digestive discomfort that can make it hard for you to sleep.  Limit screen use before  bedtime. This includes: ? Watching TV. ? Using your smartphone, tablet, and computer.  Stick to a routine. This can help you fall asleep faster. Try to do a quiet activity, brush your teeth, and go to bed at the same time each  night.  Get out of bed if you are still awake after 15 minutes of trying to sleep. Keep the lights down, but try reading or doing a quiet activity. When you feel sleepy, go back to bed.  Make sure that you drive carefully. Avoid driving if you feel very sleepy.  Keep all follow-up appointments as directed by your health care provider. This is important. Contact a health care provider if:  You are tired throughout the day or have trouble in your daily routine due to sleepiness.  You continue to have sleep problems or your sleep problems get worse. Get help right away if:  You have serious thoughts about hurting yourself or someone else. This information is not intended to replace advice given to you by your health care provider. Make sure you discuss any questions you have with your health care provider. Document Released: 02/05/2000 Document Revised: 07/10/2015 Document Reviewed: 11/08/2013 Elsevier Interactive Patient Education  [employer].

## 2016-08-11 NOTE — Progress Notes (Signed)
Subjective:  Patient ID: Melissa Myers, female    DOB: 07/18/1945  Age: 71 y.o. MRN: [id number]  CC: Insomnia (cant go sleep--starting chemo in a week--nervouse about this--sleep walk/ artheritis pain?)   HPI  Sleep disturbance: Patient's caregiver and long time friend describes episode of sleep walking (cleaning house and cooking)  In last 2weeks. Once she returns to bed, she sleeps for 4-6hrs. Ms. Wendel is unable to recollect any of these episodes. She reports waking up and feeling more tired despite sleep all night. Ongoing for years, but recent increase in incidents. She lives with husband.  Sleep study done 2016 (diagnosed with mild sleep apnea).  Anxiety: She expresses increased anxiety due to upcoming chemotherapy. She denies any SI or HI. No Etoh use.  Outpatient Medications Prior to Visit  Medication Sig Dispense Refill  . aspirin EC 81 MG tablet Take 1 tablet (81 mg total) by mouth daily.    . carvedilol (COREG) 25 MG tablet TAKE 1 TABLET BY MOUTH TWICE DAILY WITH FOOD 60 tablet 5  . Cholecalciferol (VITAMIN D3) 2000 units capsule Take 1 capsule (2,000 Units total) by mouth daily. 90 capsule 3  . ezetimibe (ZETIA) 10 MG tablet TAKE ONE TABLET BY MOUTH DAILY 90 tablet 0  . gabapentin (NEURONTIN) 300 MG capsule TAKE TWO CAPSULES FOUR TIMES DAILY 240 capsule 0  . hydrALAZINE (APRESOLINE) 25 MG tablet TAKE 1 TABLET (25 MG TOTAL) BY MOUTH 3 (THREE) TIMES DAILY. 90 tablet 1  . HYDROcodone-acetaminophen (NORCO) 10-325 MG tablet Take 1 tablet by mouth every 6 (six) hours as needed (pain). 90 tablet 0  . insulin aspart (NOVOLOG) 100 UNIT/ML injection Inject 0-20 Units into the skin 3 (three) times daily with meals. . CBG 70 - 120: 0 units CBG 121 - 150: 3 units CBG 151 - 200: 4 units CBG 201 - 250: 7 units CBG 251 - 300: 11 units CBG 301 - 350: 15 units CBG 351 - 400: 20 units    . insulin glargine (LANTUS) 100 UNIT/ML injection Inject 0.3 mLs (30 Units total)  into the skin daily.    [name] lactulose (CHRONULAC) 10 GM/15ML solution Take 10 g by mouth daily as needed for mild constipation.    . metFORMIN (GLUCOPHAGE) 500 MG tablet Take 500 mg by mouth daily with breakfast.    . potassium chloride SA (K-DUR,KLOR-CON) 20 MEQ tablet Take 2 tablets (40 mEq total) by mouth 2 (two) times daily. 120 tablet 3  . pregabalin (LYRICA) 200 MG capsule Take 1 capsule (200 mg total) by mouth 2 (two) times daily. 60 capsule 0  . rOPINIRole (REQUIP) 2 MG tablet Take 2 mg by mouth 2 (two) times daily.    [name] spironolactone (ALDACTONE) 25 MG tablet Take 1 tablet (25 mg total) by mouth daily. 90 tablet 3  . torsemide (DEMADEX) 100 MG tablet Take 0.5-1 tablets (50-100 mg total) by mouth as directed. Take one tab (100 mg) in the AM and take one half tab (50 mg) in the PM 45 tablet 3  . citalopram (CELEXA) 20 MG tablet Take 1 tablet (20 mg total) by mouth daily. 30 tablet 1   No facility-administered medications prior to visit.     ROS See HPI  Objective:  BP 100/60   Pulse 62   Temp 98.1 F (36.7 C)   Ht 5\' 2"  (1.575 m)   Wt 210 lb (95.3 kg)   SpO2 96%   BMI 38.41 kg/m   BP Readings from Last 3  Encounters:  08/11/16 100/60  06/21/16 130/62  06/01/16 135/62    Wt Readings from Last 3 Encounters:  08/11/16 210 lb (95.3 kg)  06/22/16 221 lb (100.2 kg)  06/21/16 222 lb 6.4 oz (100.9 kg)    Physical Exam  Constitutional: She is oriented to person, place, and time. No distress.  HENT:  Mouth/Throat: No oropharyngeal exudate.  Neck: Normal range of motion. Neck supple.  Cardiovascular: Normal rate and regular rhythm.   Pulmonary/Chest: Effort normal and breath sounds normal.  Musculoskeletal: She exhibits edema.  Neurological: She is alert and oriented to person, place, and time.  Skin: Skin is warm and dry. No erythema.  Psychiatric: She has a normal mood and affect. Her behavior is normal.  Vitals reviewed.   Lab Results  Component Value Date   WBC 6.9  05/24/2016   HGB 9.1 (L) 05/24/2016   HCT 29.0 (L) 05/24/2016   PLT 320 05/24/2016   GLUCOSE 308 (H) 06/21/2016   CHOL 232 (H) 02/09/2016   TRIG 362 (H) 02/09/2016   HDL 45 02/09/2016   LDLDIRECT 108.0 07/08/2014   LDLCALC 115 (H) 02/09/2016   ALT 14 05/22/2016   AST 22 05/22/2016   NA 136 06/21/2016   K 4.3 06/21/2016   CL 95 (L) 06/21/2016   CREATININE 1.22 (H) 06/21/2016   BUN 48 (H) 06/21/2016   CO2 30 06/21/2016   TSH 1.606 10/09/2015   INR 1.17 05/20/2016   HGBA1C 14.1 Repeated and verified X2. (H) 02/04/2016   MICROALBUR <0.7 07/08/2014    No results found.  Assessment & Plan:   Libbi was seen today for insomnia.  Diagnoses and all orders for this visit:  Chronic pain syndrome -     citalopram (CELEXA) 40 MG tablet; Take 1 tablet (40 mg total) by mouth daily.  Anxiety about health -     citalopram (CELEXA) 40 MG tablet; Take 1 tablet (40 mg total) by mouth daily.  Sleep disturbance -     citalopram (CELEXA) 40 MG tablet; Take 1 tablet (40 mg total) by mouth daily.   I have changed Ms. Takeda's citalopram. I am also having her maintain her aspirin EC, Vitamin D3, carvedilol, ezetimibe, hydrALAZINE, insulin glargine, insulin aspart, metFORMIN, spironolactone, potassium chloride SA, torsemide, rOPINIRole, lactulose, gabapentin, pregabalin, and HYDROcodone-acetaminophen.  Meds ordered this encounter  Medications  . citalopram (CELEXA) 40 MG tablet    Sig: Take 1 tablet (40 mg total) by mouth daily.    Dispense:  30 tablet    Refill:  1    Order Specific Question:   Supervising Provider    Answer:   Binnie Rail [[id number]]    Follow-up: Return in about 4 weeks (around 09/08/2016) for sleep walk, anxiety.  Wilfred Lacy, NP

## 2016-08-17 ENCOUNTER — Other Ambulatory Visit: Payer: Self-pay | Admitting: Internal Medicine

## 2016-08-25 ENCOUNTER — Emergency Department (HOSPITAL_COMMUNITY)
Admission: EM | Admit: 2016-08-25 | Discharge: 2016-08-26 | Disposition: A | Discharge status: Home / Self Care | Payer: PPO | Attending: Emergency Medicine | Admitting: Emergency Medicine

## 2016-08-25 ENCOUNTER — Ambulatory Visit: Payer: Self-pay | Admitting: Radiation Oncology

## 2016-08-25 ENCOUNTER — Encounter (HOSPITAL_COMMUNITY): Payer: Self-pay

## 2016-08-25 DIAGNOSIS — C250 Malignant neoplasm of head of pancreas: Secondary | ICD-10-CM | POA: Insufficient documentation

## 2016-08-25 DIAGNOSIS — K625 Hemorrhage of anus and rectum: Secondary | ICD-10-CM | POA: Insufficient documentation

## 2016-08-25 DIAGNOSIS — K649 Unspecified hemorrhoids: Secondary | ICD-10-CM | POA: Insufficient documentation

## 2016-08-25 DIAGNOSIS — R031 Nonspecific low blood-pressure reading: Secondary | ICD-10-CM | POA: Diagnosis not present

## 2016-08-25 DIAGNOSIS — E1140 Type 2 diabetes mellitus with diabetic neuropathy, unspecified: Secondary | ICD-10-CM | POA: Diagnosis not present

## 2016-08-25 DIAGNOSIS — Z794 Long term (current) use of insulin: Secondary | ICD-10-CM | POA: Insufficient documentation

## 2016-08-25 DIAGNOSIS — Z885 Allergy status to narcotic agent status: Secondary | ICD-10-CM | POA: Diagnosis not present

## 2016-08-25 DIAGNOSIS — Z7982 Long term (current) use of aspirin: Secondary | ICD-10-CM | POA: Diagnosis not present

## 2016-08-25 DIAGNOSIS — I5032 Chronic diastolic (congestive) heart failure: Secondary | ICD-10-CM | POA: Diagnosis not present

## 2016-08-25 DIAGNOSIS — R100 Acute abdomen: Secondary | ICD-10-CM | POA: Diagnosis not present

## 2016-08-25 DIAGNOSIS — Z7984 Long term (current) use of oral hypoglycemic drugs: Secondary | ICD-10-CM | POA: Diagnosis not present

## 2016-08-25 DIAGNOSIS — I110 Hypertensive heart disease with heart failure: Secondary | ICD-10-CM | POA: Insufficient documentation

## 2016-08-25 DIAGNOSIS — Z79899 Other long term (current) drug therapy: Secondary | ICD-10-CM | POA: Insufficient documentation

## 2016-08-25 LAB — COMPREHENSIVE METABOLIC PANEL
ALBUMIN: 2.5 g/dL — AB (ref 3.5–5.0)
ALK PHOS: 463 U/L — AB (ref 38–126)
ALT: 27 U/L (ref 14–54)
AST: 46 U/L — AB (ref 15–41)
Anion gap: 12 (ref 5–15)
BILIRUBIN TOTAL: 0.5 mg/dL (ref 0.3–1.2)
BUN: 26 mg/dL — AB (ref 6–20)
CALCIUM: 8.1 mg/dL — AB (ref 8.9–10.3)
CO2: 27 mmol/L (ref 22–32)
Chloride: 100 mmol/L — ABNORMAL LOW (ref 101–111)
Creatinine, Ser: 1.17 mg/dL — ABNORMAL HIGH (ref 0.44–1.00)
GFR calc Af Amer: 53 mL/min — ABNORMAL LOW (ref >60)
GFR calc non Af Amer: 46 mL/min — ABNORMAL LOW (ref >60)
GLUCOSE: 136 mg/dL — AB (ref 65–99)
Potassium: 3.5 mmol/L (ref 3.5–5.1)
SODIUM: 139 mmol/L (ref 135–145)
TOTAL PROTEIN: 5.8 g/dL — AB (ref 6.5–8.1)

## 2016-08-25 LAB — CBC
HCT: 32.9 % — ABNORMAL LOW (ref 36.0–46.0)
HEMOGLOBIN: 10.2 g/dL — AB (ref 12.0–15.0)
MCH: 25.4 pg — AB (ref 26.0–34.0)
MCHC: 31 g/dL (ref 30.0–36.0)
MCV: 81.8 fL (ref 78.0–100.0)
Platelets: 235 10*3/uL (ref 150–400)
RBC: 4.02 MIL/uL (ref 3.87–5.11)
RDW: 15.7 % — AB (ref 11.5–15.5)
WBC: 7.9 10*3/uL (ref 4.0–10.5)

## 2016-08-25 LAB — TYPE AND SCREEN
ABO/RH(D): A POS
ANTIBODY SCREEN: NEGATIVE

## 2016-08-25 LAB — POC OCCULT BLOOD, ED: FECAL OCCULT BLD: NEGATIVE

## 2016-08-25 LAB — ABO/RH: ABO/RH(D): A POS

## 2016-08-25 MED ORDER — DOCUSATE SODIUM 100 MG PO CAPS: 100.0000 mg | ORAL_CAPSULE | Freq: Two times a day (BID) | ORAL | 0 refills | Status: DC

## 2016-08-25 MED ORDER — TRAMADOL HCL 50 MG PO TABS: 50.0000 mg | ORAL_TABLET | Freq: Four times a day (QID) | ORAL | 0 refills | Status: DC | PRN

## 2016-08-25 NOTE — Discharge Instructions (Signed)
Follow up [facility] next week.  Return here if getting worse

## 2016-08-25 NOTE — ED Notes (Signed)
ED Provider at bedside. 

## 2016-08-25 NOTE — ED Provider Notes (Signed)
[facility] DEPT Provider Note   CSN: [id number] Arrival date & time: 08/25/16  [place]     History   Chief Complaint Chief Complaint  Patient presents with  . Rectal Bleeding  . Hemorrhoids    HPI Melissa Myers is a 71 y.o. female.  Patient states that she's had rectal bleeding a few times. She believes she has a history of bleeding hemorrhoids.   The history is provided by the patient.  Rectal Bleeding  Quality:  Bright red Amount:  Moderate Timing:  Intermittent Chronicity:  Recurrent Context: not anal fissures   Similar prior episodes: yes   Relieved by:  Nothing Worsened by:  Nothing Associated symptoms: no abdominal pain     Past Medical History:  Diagnosis Date  . Anemia   . Cellulitis    LOWER EXTREMITIES  . Chronic diarrhea    a/w nausea - felt related to IBS  . Deaf    left side only  . Diastolic CHF ([place])   . Disc degeneration, lumbar   . GERD (gastroesophageal reflux disease)   . Hyperlipidemia    hx rhabdo on statins  . Hypertension   . Neuropathy    feet, toes and fingers  . On home oxygen therapy    uses oxygen 2 liters min per  at night and prn during day  . OSA (obstructive sleep apnea)    05/2009 sleep study - refuses CPAP  . Osteoarthritis   . RLS (restless legs syndrome)   . Shortness of breath    chronic  . Stasis dermatitis   . Type II or unspecified type diabetes mellitus without mention of complication, not stated as uncontrolled    insulin dep    Patient Active Problem List   Diagnosis Date Noted  . Fever 05/19/2016  . Goals of care, counseling/discussion   . Palliative care by specialist   . Malignant neoplasm of head of pancreas ([place])   . Cholangitis 05/05/2016  . Common bile duct stricture   . Obstructive jaundice   . Hyperbilirubinemia 03/15/2016  . Diabetic peripheral neuropathy ([place]) 02/23/2016  . Murmur, cardiac 07/02/2015  . Chronic pain syndrome 05/05/2015  . MDD (major depressive disorder), single  episode, severe ([place]) 05/05/2015  . Chronic anemia 04/14/2015  . IDDM (insulin dependent diabetes mellitus) ([place]) 03/19/2015  . Hyperlipidemia 12/23/2014  . Venous stasis ulcers of both lower extremities ([place]) 01/04/2011  . Chronic diastolic heart failure ([place]) 06/01/2009  . Vitamin D deficiency 05/12/2009  . Sleep apnea 04/29/2009  . Dyslipidemia 04/20/2009  . Morbid obesity ([place]) 04/20/2009  . RESTLESS LEGS SYNDROME 04/20/2009  . Peripheral neuropathy 04/20/2009  . Essential hypertension 04/20/2009    Past Surgical History:  Procedure Laterality Date  . BILIARY STENT PLACEMENT N/A 05/06/2016   Procedure: BILIARY STENT PLACEMENT;  Surgeon: Gatha Mayer, MD;  Location: [facility];  Service: Endoscopy;  Laterality: N/A;  . CHOLECYSTECTOMY  1997  . COLONOSCOPY N/A 12/03/2012   Procedure: COLONOSCOPY;  Surgeon: Lafayette Dragon, MD;  Location: [facility];  Service: Endoscopy;  Laterality: N/A;  . ENDOSCOPIC RETROGRADE CHOLANGIOPANCREATOGRAPHY (ERCP) WITH PROPOFOL N/A 05/06/2016   Procedure: ENDOSCOPIC RETROGRADE CHOLANGIOPANCREATOGRAPHY (ERCP) WITH PROPOFOL;  Surgeon: Gatha Mayer, MD;  Location: [facility];  Service: Endoscopy;  Laterality: N/A;  . ERCP N/A 03/18/2016   Procedure: ENDOSCOPIC RETROGRADE CHOLANGIOPANCREATOGRAPHY (ERCP);  Surgeon: Irene Shipper, MD;  Location: [facility];  Service: Endoscopy;  Laterality: N/A;  . EUS N/A 04/28/2016   Procedure: UPPER ENDOSCOPIC ULTRASOUND (EUS)  LINEAR;  Surgeon: [name], MD;  Location: [facility];  Service: Endoscopy;  Laterality: N/A;  . IR CHOLANGIOGRAM EXISTING TUBE  06/09/2016  . IR GENERIC HISTORICAL  05/07/2016   IR BILIARY DRAIN PLACEMENT WITH CHOLANGIOGRAM 05/07/2016 Jacqulynn Cadet, MD [facility]  . IR GENERIC HISTORICAL  05/19/2016   IR BILIARY STENT(S) EXISTING ACCESS INC DILATION CATH EXCHANGE 05/19/2016 Jacqulynn Cadet, MD [facility]  . TONSILLECTOMY  1970  . TUBAL LIGATION  1980  . UMBILICAL HERNIA REPAIR   1995  . uterine polyp removal  2008    OB History    No data available       Home Medications    Prior to Admission medications   Medication Sig Start Date End Date Taking? Authorizing Provider  aspirin EC 81 MG tablet Take 1 tablet (81 mg total) by mouth daily. 07/08/14   Rowe Clack, MD  carvedilol (COREG) 25 MG tablet TAKE 1 TABLET BY MOUTH TWICE DAILY WITH FOOD 01/20/16   Bensimhon, Shaune Pascal, MD  Cholecalciferol (VITAMIN D3) 2000 units capsule Take 1 capsule (2,000 Units total) by mouth daily. 06/17/15   Janith Lima, MD  citalopram (CELEXA) 40 MG tablet Take 1 tablet (40 mg total) by mouth daily. 08/11/16   Nche, Charlene Brooke, NP  docusate sodium (COLACE) 100 MG capsule Take 1 capsule (100 mg total) by mouth every 12 (twelve) hours. 08/25/16   Milton Ferguson, MD  ezetimibe (ZETIA) 10 MG tablet TAKE ONE TABLET BY MOUTH DAILY 02/03/16   [name], MD  gabapentin (NEURONTIN) 300 MG capsule TAKE TWO CAPSULES FOUR TIMES DAILY 08/17/16   Golden Circle, FNP  hydrALAZINE (APRESOLINE) 25 MG tablet TAKE 1 TABLET (25 MG TOTAL) BY MOUTH 3 (THREE) TIMES DAILY. 02/16/16   Bensimhon, Shaune Pascal, MD  HYDROcodone-acetaminophen (NORCO) 10-325 MG tablet Take 1 tablet by mouth every 6 (six) hours as needed (pain). 08/03/16   Golden Circle, FNP  insulin aspart (NOVOLOG) 100 UNIT/ML injection Inject 0-20 Units into the skin 3 (three) times daily with meals. . CBG 70 - 120: 0 units CBG 121 - 150: 3 units CBG 151 - 200: 4 units CBG 201 - 250: 7 units CBG 251 - 300: 11 units CBG 301 - 350: 15 units CBG 351 - 400: 20 units 03/21/16   Mariel Aloe, MD  insulin glargine (LANTUS) 100 UNIT/ML injection Inject 0.3 mLs (30 Units total) into the skin daily. 03/22/16   Mariel Aloe, MD  lactulose (CHRONULAC) 10 GM/15ML solution Take 10 g by mouth daily as needed for mild constipation.    [provider]  metFORMIN (GLUCOPHAGE) 500 MG tablet Take 500 mg by mouth daily with  breakfast.    [provider]  potassium chloride SA (K-DUR,KLOR-CON) 20 MEQ tablet Take 2 tablets (40 mEq total) by mouth 2 (two) times daily. 06/17/16   Larey Dresser, MD  pregabalin (LYRICA) 200 MG capsule Take 1 capsule (200 mg total) by mouth 2 (two) times daily. 08/03/16   Golden Circle, FNP  rOPINIRole (REQUIP) 2 MG tablet Take 2 mg by mouth 2 (two) times daily.    [provider]  spironolactone (ALDACTONE) 25 MG tablet Take 1 tablet (25 mg total) by mouth daily. 06/17/16 09/15/16  Larey Dresser, MD  torsemide (DEMADEX) 100 MG tablet Take 0.5-1 tablets (50-100 mg total) by mouth as directed. Take one tab (100 mg) in the AM and take one half tab (50 mg) in the  PM 06/17/16 09/15/16  Larey Dresser, MD  traMADol (ULTRAM) 50 MG tablet Take 1 tablet (50 mg total) by mouth every 6 (six) hours as needed. 08/25/16   Milton Ferguson, MD    Family History Family History  Problem Relation Age of Onset  . Heart disease Mother   . Heart attack Mother 52  . Heart disease Father   . Heart attack Father 25  . Heart disease Unknown        family history  . Stomach cancer Paternal Grandmother   . Lung cancer Paternal Grandfather   . CVA Unknown        several aunts  . Heart attack Unknown        several aunts and an uncle  . Colon cancer Neg Hx     Social History Social History  Substance Use Topics  . Smoking status: Never Smoker  . Smokeless tobacco: Never Used  . Alcohol use No     Allergies   Morphine; Statins; Sulfa antibiotics; Cymbalta [duloxetine hcl]; Sulfasalazine; Levemir [insulin detemir]; and Zinc   Review of Systems Review of Systems  Constitutional: Negative for appetite change and fatigue.  HENT: Negative for congestion, ear discharge and sinus pressure.   Eyes: Negative for discharge.  Respiratory: Negative for cough.   Cardiovascular: Negative for chest pain.  Gastrointestinal: Positive for hematochezia. Negative for abdominal pain and  diarrhea.       Rectal bleeding  Genitourinary: Negative for frequency and hematuria.  Musculoskeletal: Negative for back pain.  Skin: Negative for rash.  Neurological: Negative for seizures and headaches.  Psychiatric/Behavioral: Negative for hallucinations.     Physical Exam Updated Vital Signs BP 126/61   Pulse 66   Temp 97.9 F (36.6 C) (Oral)   Resp 18   Ht 5\' 2"  (1.575 m)   Wt 95.3 kg (210 lb)   SpO2 94%   BMI 38.41 kg/m   Physical Exam  Constitutional: She is oriented to person, place, and time. She appears well-developed.  HENT:  Head: Normocephalic.  Eyes: Conjunctivae and EOM are normal. No scleral icterus.  Neck: Neck supple. No thyromegaly present.  Cardiovascular: Normal rate and regular rhythm.  Exam reveals no gallop and no friction rub.   No murmur heard. Pulmonary/Chest: No stridor. She has no wheezes. She has no rales. She exhibits no tenderness.  Abdominal: She exhibits no distension. There is no tenderness. There is no rebound.  Genitourinary:  Genitourinary Comments: Brown stool, hem p0s  Musculoskeletal: Normal range of motion. She exhibits no edema.  Lymphadenopathy:    She has no cervical adenopathy.  Neurological: She is oriented to person, place, and time. She exhibits normal muscle tone. Coordination normal.  Skin: No rash noted. No erythema.  Psychiatric: She has a normal mood and affect. Her behavior is normal.     ED Treatments / Results  Labs (all labs ordered are listed, but only abnormal results are displayed) Labs Reviewed  COMPREHENSIVE METABOLIC PANEL - Abnormal; Notable for the following:       Result Value   Chloride 100 (*)    Glucose, Bld 136 (*)    BUN 26 (*)    Creatinine, Ser 1.17 (*)    Calcium 8.1 (*)    Total Protein 5.8 (*)    Albumin 2.5 (*)    AST 46 (*)    Alkaline Phosphatase 463 (*)    GFR calc non Af Amer 46 (*)    GFR calc Af Amer 53 (*)  All other components within normal limits  CBC - Abnormal;  Notable for the following:    Hemoglobin 10.2 (*)    HCT 32.9 (*)    MCH 25.4 (*)    RDW 15.7 (*)    All other components within normal limits  POC OCCULT BLOOD, ED  TYPE AND SCREEN  ABO/RH    EKG  EKG Interpretation None       Radiology No results found.  Procedures Procedures (including critical care time)  Medications Ordered in ED Medications - No data to display   Initial Impression / Assessment and Plan / ED Course  I have reviewed the triage vital signs and the nursing notes.  Pertinent labs & imaging results that were available during my care of the patient were reviewed by me and considered in my medical decision making (see chart for details).     Patient with rectal bleeding. Patient is hemodynamically stable with a hemoglobin at 10. Patient is given Colace and some pain medicine and referred to GI for further evaluation she is to return if symptoms get worse  Final Clinical Impressions(s) / ED Diagnoses   Final diagnoses:  Rectal bleeding    New Prescriptions New Prescriptions   DOCUSATE SODIUM (COLACE) 100 MG CAPSULE    Take 1 capsule (100 mg total) by mouth every 12 (twelve) hours.   TRAMADOL (ULTRAM) 50 MG TABLET    Take 1 tablet (50 mg total) by mouth every 6 (six) hours as needed.     Milton Ferguson, MD 08/25/16 2342

## 2016-08-25 NOTE — ED Triage Notes (Signed)
Pt from home with ems hx of pancreatic cancer. C/o rectal bleeding with hemrrhoids that started about a week ago. Pain 6/10. Pt not on a blood thinner, denies n/v. VSS, Pt a.o, nad

## 2016-08-26 ENCOUNTER — Telehealth: Payer: Self-pay | Admitting: Internal Medicine

## 2016-08-26 MED ORDER — EZETIMIBE 10 MG PO TABS: 10.0000 mg | ORAL_TABLET | Freq: Every day | ORAL | 1 refills | Status: DC

## 2016-08-26 NOTE — ED Notes (Signed)
Pt went home in a cab, nad. Pt a/ox4

## 2016-08-26 NOTE — Telephone Encounter (Signed)
Sent refill on zetia. See phone note 08/02/16 the cymbalta has been d/c due to side effects was causing itching, rash and diarrhea...Melissa Myers

## 2016-08-26 NOTE — Telephone Encounter (Signed)
DULoxetine (CYMBALTA) 60 MG capsule  ezetimibe (ZETIA) 10 MG tablet   Home health called and is requesting a refill on these medication.  Send them to   [facility], [place] - [address] [phone] (Phone) [phone] (Fax)

## 2016-08-30 ENCOUNTER — Ambulatory Visit: Payer: Self-pay | Admitting: Radiation Oncology

## 2016-09-01 NOTE — Addendum Note (Signed)
Addendum  created 09/01/16 1627 by Lynda Rainwater, MD   Sign clinical note

## 2016-09-01 NOTE — Anesthesia Postprocedure Evaluation (Signed)
Anesthesia Post Note  Patient: Melissa Myers  Procedure(s) Performed: Procedure(s) (LRB): UPPER ENDOSCOPIC ULTRASOUND (EUS) LINEAR (N/A)     Anesthesia Post Evaluation  Last Vitals:  Vitals:   04/28/16 1440 04/28/16 1450  BP: 117/66 133/84  Pulse: 72 71  Resp: 20 19  Temp:      Last Pain:  Vitals:   04/28/16 1421  TempSrc: Oral                 Lynda Rainwater

## 2016-09-05 DIAGNOSIS — I219 Acute myocardial infarction, unspecified: Secondary | ICD-10-CM | POA: Diagnosis not present

## 2016-09-05 DIAGNOSIS — R935 Abnormal findings on diagnostic imaging of other abdominal regions, including retroperitoneum: Secondary | ICD-10-CM | POA: Diagnosis not present

## 2016-09-05 DIAGNOSIS — C250 Malignant neoplasm of head of pancreas: Secondary | ICD-10-CM | POA: Diagnosis not present

## 2016-09-05 DIAGNOSIS — K869 Disease of pancreas, unspecified: Secondary | ICD-10-CM | POA: Diagnosis not present

## 2016-09-06 DIAGNOSIS — R634 Abnormal weight loss: Secondary | ICD-10-CM | POA: Diagnosis not present

## 2016-09-06 DIAGNOSIS — R630 Anorexia: Secondary | ICD-10-CM | POA: Diagnosis not present

## 2016-09-06 DIAGNOSIS — K8689 Other specified diseases of pancreas: Secondary | ICD-10-CM | POA: Diagnosis not present

## 2016-09-06 DIAGNOSIS — C250 Malignant neoplasm of head of pancreas: Secondary | ICD-10-CM | POA: Diagnosis not present

## 2016-09-06 DIAGNOSIS — Z5111 Encounter for antineoplastic chemotherapy: Secondary | ICD-10-CM | POA: Diagnosis not present

## 2016-09-06 DIAGNOSIS — R109 Unspecified abdominal pain: Secondary | ICD-10-CM | POA: Diagnosis not present

## 2016-09-06 DIAGNOSIS — Z6841 Body Mass Index (BMI) 40.0 and over, adult: Secondary | ICD-10-CM | POA: Diagnosis not present

## 2016-09-06 DIAGNOSIS — R110 Nausea: Secondary | ICD-10-CM | POA: Diagnosis not present

## 2016-09-12 ENCOUNTER — Telehealth: Payer: Self-pay | Admitting: Internal Medicine

## 2016-09-12 NOTE — Telephone Encounter (Signed)
medication not prescribed by this office

## 2016-09-12 NOTE — Telephone Encounter (Signed)
Requesting carvedilol to be sent to [facility]

## 2016-09-15 ENCOUNTER — Other Ambulatory Visit: Payer: Self-pay | Admitting: Family

## 2016-09-20 DIAGNOSIS — K8689 Other specified diseases of pancreas: Secondary | ICD-10-CM | POA: Diagnosis not present

## 2016-09-20 DIAGNOSIS — G629 Polyneuropathy, unspecified: Secondary | ICD-10-CM | POA: Diagnosis not present

## 2016-09-20 DIAGNOSIS — R109 Unspecified abdominal pain: Secondary | ICD-10-CM | POA: Diagnosis not present

## 2016-09-20 DIAGNOSIS — R197 Diarrhea, unspecified: Secondary | ICD-10-CM | POA: Diagnosis not present

## 2016-09-20 DIAGNOSIS — Z5111 Encounter for antineoplastic chemotherapy: Secondary | ICD-10-CM | POA: Diagnosis not present

## 2016-09-20 DIAGNOSIS — R8290 Unspecified abnormal findings in urine: Secondary | ICD-10-CM | POA: Diagnosis not present

## 2016-09-20 DIAGNOSIS — R110 Nausea: Secondary | ICD-10-CM | POA: Diagnosis not present

## 2016-09-20 DIAGNOSIS — C250 Malignant neoplasm of head of pancreas: Secondary | ICD-10-CM | POA: Diagnosis not present

## 2016-09-21 ENCOUNTER — Encounter (HOSPITAL_COMMUNITY): Payer: PPO | Admitting: Internal Medicine

## 2016-09-22 ENCOUNTER — Other Ambulatory Visit: Payer: Self-pay | Admitting: Family

## 2016-09-22 NOTE — Telephone Encounter (Signed)
Last refill was 08/20/16 per [facility]

## 2016-09-22 NOTE — Telephone Encounter (Signed)
Faxed

## 2016-10-04 DIAGNOSIS — K8689 Other specified diseases of pancreas: Secondary | ICD-10-CM | POA: Diagnosis not present

## 2016-10-04 DIAGNOSIS — Z5111 Encounter for antineoplastic chemotherapy: Secondary | ICD-10-CM | POA: Diagnosis not present

## 2016-10-04 DIAGNOSIS — R110 Nausea: Secondary | ICD-10-CM | POA: Diagnosis not present

## 2016-10-04 DIAGNOSIS — C250 Malignant neoplasm of head of pancreas: Secondary | ICD-10-CM | POA: Diagnosis not present

## 2016-10-04 DIAGNOSIS — R197 Diarrhea, unspecified: Secondary | ICD-10-CM | POA: Diagnosis not present

## 2016-10-18 DIAGNOSIS — G629 Polyneuropathy, unspecified: Secondary | ICD-10-CM | POA: Diagnosis not present

## 2016-10-18 DIAGNOSIS — R109 Unspecified abdominal pain: Secondary | ICD-10-CM | POA: Diagnosis not present

## 2016-10-18 DIAGNOSIS — Z5111 Encounter for antineoplastic chemotherapy: Secondary | ICD-10-CM | POA: Diagnosis not present

## 2016-10-18 DIAGNOSIS — C250 Malignant neoplasm of head of pancreas: Secondary | ICD-10-CM | POA: Diagnosis not present

## 2016-10-18 DIAGNOSIS — Z79899 Other long term (current) drug therapy: Secondary | ICD-10-CM | POA: Diagnosis not present

## 2016-10-18 DIAGNOSIS — R140 Abdominal distension (gaseous): Secondary | ICD-10-CM | POA: Diagnosis not present

## 2016-10-18 DIAGNOSIS — R197 Diarrhea, unspecified: Secondary | ICD-10-CM | POA: Diagnosis not present

## 2016-10-18 DIAGNOSIS — R110 Nausea: Secondary | ICD-10-CM | POA: Diagnosis not present

## 2016-10-18 DIAGNOSIS — K8689 Other specified diseases of pancreas: Secondary | ICD-10-CM | POA: Diagnosis not present

## 2016-10-19 ENCOUNTER — Other Ambulatory Visit: Payer: Self-pay | Admitting: Internal Medicine

## 2016-10-21 ENCOUNTER — Other Ambulatory Visit: Payer: Self-pay | Admitting: Family

## 2016-10-21 NOTE — Progress Notes (Signed)
Pre visit review using our clinic review tool, if applicable. No additional management support is needed unless otherwise documented below in the visit note. 

## 2016-10-21 NOTE — Progress Notes (Addendum)
Subjective:   Melissa Myers is a 71 y.o. female who presents for Medicare Annual (Subsequent) preventive examination.  Patient c/o BLE edema and redness. States she would like to have an anti-depressant prescribed. She is having emotional difficulty accepting that her mother will soon pass away and the patient is dealing with battling pancreatic cancer. Scored 8 on depression screening.  Review of Systems:  No ROS.  Medicare Wellness Visit. Additional risk factors are reflected in the social history.  Cardiac Risk Factors include: advanced age (>59men, >13 women);diabetes mellitus;hypertension;dyslipidemia;obesity (BMI >30kg/m2) Sleep patterns: feels rested on waking, gets up 1 times nightly to void and sleeps 5-6 hours nightly. Patient reports insomnia issues, discussed recommended sleep tips and stress reduction tips.   Home Safety/Smoke Alarms: Feels safe in home. Smoke alarms in place.  Living environment; residence and Firearm Safety: 1-story house/ trailer, equipment: Walkers, Type: Conservation officer, nature, Hydrologist, Type: Tub Surveyor, quantity and commode, no firearms. Lives with husband, no needs for DME, good support system Seat Belt Safety/Bike Helmet: Wears seat belt.    Objective:     Vitals: BP 116/60   Pulse (!) 59   Resp 20   Ht 5\' 2"  (1.575 m)   Wt 209 lb (94.8 kg)   SpO2 98%   BMI 38.23 kg/m   Body mass index is 38.23 kg/m.   Tobacco History  Smoking Status  . Never Smoker  Smokeless Tobacco  . Never Used     Counseling given: Not Answered   Past Medical History:  Diagnosis Date  . Anemia   . Cellulitis    LOWER EXTREMITIES  . Chronic diarrhea    a/w nausea - felt related to IBS  . Deaf    left side only  . Diastolic CHF ([place])   . Disc degeneration, lumbar   . GERD (gastroesophageal reflux disease)   . Hyperlipidemia    hx rhabdo on statins  . Hypertension   . Neuropathy    feet, toes and fingers  . On home oxygen therapy    uses oxygen 2  liters min per [place] at night and prn during day  . OSA (obstructive sleep apnea)    05/2009 sleep study - refuses CPAP  . Osteoarthritis   . RLS (restless legs syndrome)   . Shortness of breath    chronic  . Stasis dermatitis   . Type II or unspecified type diabetes mellitus without mention of complication, not stated as uncontrolled    insulin dep   Past Surgical History:  Procedure Laterality Date  . BILIARY STENT PLACEMENT N/A 05/06/2016   Procedure: BILIARY STENT PLACEMENT;  Surgeon: Gatha Mayer, MD;  Location: [facility];  Service: Endoscopy;  Laterality: N/A;  . CHOLECYSTECTOMY  1997  . COLONOSCOPY N/A 12/03/2012   Procedure: COLONOSCOPY;  Surgeon: Lafayette Dragon, MD;  Location: [facility];  Service: Endoscopy;  Laterality: N/A;  . ENDOSCOPIC RETROGRADE CHOLANGIOPANCREATOGRAPHY (ERCP) WITH PROPOFOL N/A 05/06/2016   Procedure: ENDOSCOPIC RETROGRADE CHOLANGIOPANCREATOGRAPHY (ERCP) WITH PROPOFOL;  Surgeon: Gatha Mayer, MD;  Location: [facility];  Service: Endoscopy;  Laterality: N/A;  . ERCP N/A 03/18/2016   Procedure: ENDOSCOPIC RETROGRADE CHOLANGIOPANCREATOGRAPHY (ERCP);  Surgeon: Irene Shipper, MD;  Location: [facility];  Service: Endoscopy;  Laterality: N/A;  . EUS N/A 04/28/2016   Procedure: UPPER ENDOSCOPIC ULTRASOUND (EUS) LINEAR;  Surgeon: [name], MD;  Location: [facility];  Service: Endoscopy;  Laterality: N/A;  . IR CHOLANGIOGRAM EXISTING TUBE  06/09/2016  . IR GENERIC  HISTORICAL  05/07/2016   IR BILIARY DRAIN PLACEMENT WITH CHOLANGIOGRAM 05/07/2016 Jacqulynn Cadet, MD [facility]  . IR GENERIC HISTORICAL  05/19/2016   IR BILIARY STENT(S) EXISTING ACCESS INC DILATION CATH EXCHANGE 05/19/2016 Jacqulynn Cadet, MD [facility]  . TONSILLECTOMY  1970  . TUBAL LIGATION  1980  . UMBILICAL HERNIA REPAIR  1995  . uterine polyp removal  2008   Family History  Problem Relation Age of Onset  . Heart disease Mother   . Heart attack Mother 27  . Heart disease  Father   . Heart attack Father 49  . Heart disease Unknown        family history  . Stomach cancer Paternal Grandmother   . Lung cancer Paternal Grandfather   . CVA Unknown        several aunts  . Heart attack Unknown        several aunts and an uncle  . Colon cancer Neg Hx    History  Sexual Activity  . Sexual activity: No    Outpatient Encounter Prescriptions as of 10/25/2016  Medication Sig  . aspirin EC 81 MG tablet Take 1 tablet (81 mg total) by mouth daily.  . carvedilol (COREG) 25 MG tablet TAKE 1 TABLET BY MOUTH TWICE DAILY WITH FOOD  . Cholecalciferol (VITAMIN D3) 2000 units capsule Take 1 capsule (2,000 Units total) by mouth daily.  . citalopram (CELEXA) 40 MG tablet Take 1 tablet (40 mg total) by mouth daily.  [name] docusate sodium (COLACE) 100 MG capsule Take 1 capsule (100 mg total) by mouth every 12 (twelve) hours.  [name] ezetimibe (ZETIA) 10 MG tablet Take 1 tablet (10 mg total) by mouth daily.  [name] gabapentin (NEURONTIN) 300 MG capsule TAKE TWO CAPSULES FOUR TIMES DAILY  . hydrALAZINE (APRESOLINE) 25 MG tablet TAKE 1 TABLET (25 MG TOTAL) BY MOUTH 3 (THREE) TIMES DAILY.  [name] HYDROcodone-acetaminophen (NORCO) 10-325 MG tablet Take 1 tablet by mouth every 6 (six) hours as needed (pain).  . insulin aspart (NOVOLOG) 100 UNIT/ML injection Inject 0-20 Units into the skin 3 (three) times daily with meals. . CBG 70 - 120: 0 units CBG 121 - 150: 3 units CBG 151 - 200: 4 units CBG 201 - 250: 7 units CBG 251 - 300: 11 units CBG 301 - 350: 15 units CBG 351 - 400: 20 units  . insulin glargine (LANTUS) 100 UNIT/ML injection Inject 0.3 mLs (30 Units total) into the skin daily.  [name] lactulose (CHRONULAC) 10 GM/15ML solution Take 10 g by mouth daily as needed for mild constipation.  . lidocaine-prilocaine (EMLA) cream Apply to port site prior to being accessed  . lipase/protease/amylase (CREON) 36000 UNITS CPEP capsule 2 po prior to snacks; 4 po prior to meals  . LYRICA 200 MG capsule TAKE ONE  CAPSULE TWICE A DAY  . metFORMIN (GLUCOPHAGE) 500 MG tablet Take 500 mg by mouth daily with breakfast.  . ondansetron (ZOFRAN) 8 MG tablet Take by mouth.  . potassium chloride SA (K-DUR,KLOR-CON) 20 MEQ tablet Take 2 tablets (40 mEq total) by mouth 2 (two) times daily.  . prochlorperazine (COMPAZINE) 10 MG tablet Take by mouth.  [name] rOPINIRole (REQUIP XL) 4 MG 24 hr tablet TAKE ONE TABLET AT BEDTIME  . traMADol (ULTRAM) 50 MG tablet Take 1 tablet (50 mg total) by mouth every 6 (six) hours as needed.  . meclizine (ANTIVERT) 25 MG tablet Take by mouth.  . spironolactone (ALDACTONE) 25 MG tablet Take 1 tablet (25 mg total) by  mouth daily.  [name] torsemide (DEMADEX) 100 MG tablet Take 0.5-1 tablets (50-100 mg total) by mouth as directed. Take one tab (100 mg) in the AM and take one half tab (50 mg) in the PM  . [DISCONTINUED] LYRICA 200 MG capsule TAKE ONE CAPSULE TWICE A DAY  . [DISCONTINUED] rOPINIRole (REQUIP) 2 MG tablet Take 2 mg by mouth 2 (two) times daily.   No facility-administered encounter medications on file as of 10/25/2016.     Activities of Daily Living In your present state of health, do you have any difficulty performing the following activities: 10/25/2016 05/19/2016  Hearing? N -  [place]? N -  Difficulty concentrating or making decisions? N -  Comment - -  Walking or climbing stairs? N -  Dressing or bathing? N -  Doing errands, shopping? N Y  Conservation officer, nature and eating ? N -  Using the Toilet? N -  In the past six months, have you accidently leaked urine? N -  Do you have problems with loss of bowel control? N -  Managing your Medications? N -  Managing your Finances? N -  Housekeeping or managing your Housekeeping? N -  Some recent data might be hidden    Patient Care Team: [name], MD as PCP - General (Internal Medicine) Rigoberto Noel, MD as Consulting Physician (Pulmonary Disease) Larey Dresser, MD as Consulting Physician  (Cardiology) Megan Salon, MD as Consulting Physician (Gynecology) Kristeen Miss, MD as Consulting Physician (Neurosurgery) Earlie Server, MD (Orthopedic Surgery) Almedia Balls, MD (Orthopedic Surgery) Lafayette Dragon, MD (Inactive) (Gastroenterology) Renato Shin, MD (Endocrinology) Darleen Crocker, MD (Ophthalmology)    Assessment:    Physical assessment deferred to PCP.  Exercise Activities and Dietary recommendations Current Exercise Habits: Home exercise routine, Type of exercise: stretching;walking, Time (Minutes): 20, Frequency (Times/Week): 3, Weekly Exercise (Minutes/Week): 60, Intensity: Mild, Exercise limited by: None identified  Diet (meal preparation, eat out, water intake, caffeinated beverages, dairy products, fruits and vegetables): in general, a "healthy" diet      Reviewed heart healthy and diabetic diet, encouraged patient to increase daily water intake.   Goals    . Increase my physical activity          Do chair exercises and walk as much as possible at home using my walker      Fall Risk Fall Risk  10/25/2016 06/01/2016 11/17/2015 10/22/2015 05/15/2015  Falls in the past year? No Yes Yes Yes No  Comment - - - Falls: 2 over the past year. Last fall 06/2015.   Tripped over the wheel on walker and other unknown; just went down.  -  Number falls in past yr: - 1 2 or more 2 or more -  Injury with Fall? - No Yes No -  Comment - - "I hurt my tailbone" - -  Risk Factor Category  - - High Fall Risk High Fall Risk -  Risk for fall due to : Impaired balance/gait;Impaired mobility History of fall(s);Impaired balance/gait History of fall(s);Impaired mobility;Medication side effect History of fall(s);Impaired balance/gait;Impaired mobility;Medication side effect;Other (Comment) -  Risk for fall due to: Comment - - - Neuropathy, restless legs and pain -  Follow up - Falls evaluation completed;Education provided;Falls prevention discussed Education provided;Falls prevention  discussed Falls evaluation completed;Education provided;Falls prevention discussed -   Depression Screen PHQ 2/9 Scores 10/25/2016 10/29/2015 10/22/2015 05/15/2015  PHQ - 2 Score 2 0 0 0  PHQ- 9 Score 8 - - -  Cognitive Function MMSE - Mini Mental State Exam 10/25/2016  Orientation to time 5  Orientation to Place 5  Registration 3  Attention/ Calculation 5  Recall 3  Language- name 2 objects 2  Language- repeat 1  Language- follow 3 step command 3  Language- read & follow direction 1  Write a sentence 1  Copy design 1  Total score 30        Immunization History  Administered Date(s) Administered  . Influenza Split 01/07/2011, 01/05/2012  . Influenza Whole 02/20/2009  . Influenza, High Dose Seasonal PF 10/23/2015, 10/25/2016  . Influenza,inj,Quad PF,6+ Mos 11/08/2013, 11/05/2014  . Influenza-Unspecified 12/22/2012  . Pneumococcal Conjugate-13 11/08/2013  . Pneumococcal Polysaccharide-23 02/21/2006, 01/16/2013  . Td 02/22/2004, 03/06/2014   Screening Tests Health Maintenance  Topic Date Due  . OPHTHALMOLOGY EXAM  01/21/2015  . URINE MICROALBUMIN  07/08/2015  . HEMOGLOBIN A1C  08/04/2016  . INFLUENZA VACCINE  09/21/2016  . FOOT EXAM  02/03/2017  . COLONOSCOPY  12/04/2022  . TETANUS/TDAP  03/06/2024  . DEXA SCAN  Completed  . Hepatitis C Screening  Completed  . PNA vac Low Risk Adult  Completed      Plan:     Scheduled PCP visit 10/28/16 to address c/o BLE edema and redness, as well as discuss feelings of depression and anti-depressant prescription. Hospice grief counseling resources and counseling resources provided to patient during AWV.  Continue doing brain stimulating activities (puzzles, reading, adult coloring books, staying active) to keep memory sharp.   Continue to eat heart healthy diet (full of fruits, vegetables, whole grains, lean protein, water--limit salt, fat, and sugar intake) and increase physical activity as tolerated.  I have personally reviewed  and noted the following in the patient's chart:   . Medical and social history . Use of alcohol, tobacco or illicit drugs  . Current medications and supplements . Functional ability and status . Nutritional status . Physical activity . Advanced directives . List of other physicians . Vitals . Screenings to include cognitive, depression, and falls . Referrals and appointments  In addition, I have reviewed and discussed with patient certain preventive protocols, quality metrics, and best practice recommendations. A written personalized care plan for preventive services as well as general preventive health recommendations were provided to patient.     Michiel Cowboy, RN  10/25/2016   Medical screening examination/treatment/procedure(s) were performed by non-physician practitioner and as supervising physician I was immediately available for consultation/collaboration. I agree with above. Binnie Rail, MD

## 2016-10-25 ENCOUNTER — Ambulatory Visit (INDEPENDENT_AMBULATORY_CARE_PROVIDER_SITE_OTHER): Payer: PPO | Admitting: *Deleted

## 2016-10-25 VITALS — BP 116/60 | HR 59 | Resp 20 | Ht 62.0 in | Wt 209.0 lb

## 2016-10-25 DIAGNOSIS — Z0000 Encounter for general adult medical examination without abnormal findings: Secondary | ICD-10-CM | POA: Diagnosis not present

## 2016-10-25 DIAGNOSIS — Z23 Encounter for immunization: Secondary | ICD-10-CM

## 2016-10-25 DIAGNOSIS — E2839 Other primary ovarian failure: Secondary | ICD-10-CM | POA: Diagnosis not present

## 2016-10-25 NOTE — Patient Instructions (Addendum)
Continue doing brain stimulating activities (puzzles, reading, adult coloring books, staying active) to keep memory sharp.   Continue to eat heart healthy diet (full of fruits, vegetables, whole grains, lean protein, water--limit salt, fat, and sugar intake) and increase physical activity as tolerated.   Melissa Myers , Thank you for taking time to come for your Medicare Wellness Visit. I appreciate your ongoing commitment to your health goals. Please review the following plan we discussed and let me know if I can assist you in the future.   These are the goals we discussed: Goals    . Increase my physical activity          Do chair exercises and walk as much as possible at home using my walker       This is a list of the screening recommended for you and due dates:  Health Maintenance  Topic Date Due  . Eye exam for diabetics  01/21/2015  . Urine Protein Check  07/08/2015  . Hemoglobin A1C  08/04/2016  . Flu Shot  09/21/2016  . Complete foot exam   02/03/2017  . Colon Cancer Screening  12/04/2022  . Tetanus Vaccine  03/06/2024  . DEXA scan (bone density measurement)  Completed  .  Hepatitis C: One time screening is recommended by Center for Disease Control  (CDC) for  adults born from [birth year] through 1965.   Completed  . Pneumonia vaccines  Completed   Influenza Virus Vaccine (Flucelvax) What is this medicine? INFLUENZA VIRUS VACCINE (in floo EN zuh VAHY ruhs vak SEEN) helps to reduce the risk of getting influenza also known as the flu. The vaccine only helps protect you against some strains of the flu. This medicine may be used for other purposes; ask your health care provider or pharmacist if you have questions. COMMON BRAND NAME(S): FLUCELVAX What should I tell my health care provider before I take this medicine? They need to know if you have any of these conditions: -bleeding disorder like hemophilia -fever or infection -Guillain-Barre syndrome or other neurological  problems -immune system problems -infection with the human immunodeficiency virus (HIV) or AIDS -low blood platelet counts -multiple sclerosis -an unusual or allergic reaction to influenza virus vaccine, other medicines, foods, dyes or preservatives -pregnant or trying to get pregnant -breast-feeding How should I use this medicine? This vaccine is for injection into a muscle. It is given by a health care professional. A copy of Vaccine Information Statements will be given before each vaccination. Read this sheet carefully each time. The sheet may change frequently. Talk to your pediatrician regarding the use of this medicine in children. Special care may be needed. Overdosage: If you think you've taken too much of this medicine contact a poison control center or emergency room at once. Overdosage: If you think you have taken too much of this medicine contact a poison control center or emergency room at once. NOTE: This medicine is only for you. Do not share this medicine with others. What if I miss a dose? This does not apply. What may interact with this medicine? -chemotherapy or radiation therapy -medicines that lower your immune system like etanercept, anakinra, infliximab, and adalimumab -medicines that treat or prevent blood clots like warfarin -phenytoin -steroid medicines like prednisone or cortisone -theophylline -vaccines This list may not describe all possible interactions. Give your health care provider a list of all the medicines, herbs, non-prescription drugs, or dietary supplements you use. Also tell them if you smoke, drink alcohol, or use illegal  drugs. Some items may interact with your medicine. What should I watch for while using this medicine? Report any side effects that do not go away within 3 days to your doctor or health care professional. Call your health care provider if any unusual symptoms occur within 6 weeks of receiving this vaccine. You may still catch the  flu, but the illness is not usually as bad. You cannot get the flu from the vaccine. The vaccine will not protect against colds or other illnesses that may cause fever. The vaccine is needed every year. What side effects may I notice from receiving this medicine? Side effects that you should report to your doctor or health care professional as soon as possible: -allergic reactions like skin rash, itching or hives, swelling of the face, lips, or tongue Side effects that usually do not require medical attention (Report these to your doctor or health care professional if they continue or are bothersome.): -fever -headache -muscle aches and pains -pain, tenderness, redness, or swelling at the injection site -tiredness This list may not describe all possible side effects. Call your doctor for medical advice about side effects. You may report side effects to FDA at 1-800-FDA-1088. Where should I keep my medicine? The vaccine will be given by a health care professional in a clinic, pharmacy, doctor's office, or other health care setting. You will not be given vaccine doses to store at home. NOTE: This sheet is a summary. It may not cover all possible information. If you have questions about this medicine, talk to your doctor, pharmacist, or health care provider.  2018 Elsevier/Gold Standard (2011-01-19 14:06:47)

## 2016-10-25 NOTE — Telephone Encounter (Signed)
Faxed script back to [facility]...Johny Chess

## 2016-10-28 ENCOUNTER — Ambulatory Visit: Payer: PPO | Admitting: Internal Medicine

## 2016-10-28 ENCOUNTER — Ambulatory Visit
Admission: RE | Admit: 2016-10-28 | Discharge: 2016-10-28 | Disposition: A | Discharge status: Home / Self Care | Payer: PPO | Source: Ambulatory Visit | Attending: Internal Medicine | Admitting: Internal Medicine

## 2016-10-28 DIAGNOSIS — E2839 Other primary ovarian failure: Secondary | ICD-10-CM

## 2016-10-31 ENCOUNTER — Other Ambulatory Visit: Payer: Self-pay | Admitting: Family

## 2016-10-31 DIAGNOSIS — K869 Disease of pancreas, unspecified: Secondary | ICD-10-CM | POA: Diagnosis not present

## 2016-10-31 DIAGNOSIS — C250 Malignant neoplasm of head of pancreas: Secondary | ICD-10-CM | POA: Diagnosis not present

## 2016-11-01 DIAGNOSIS — R110 Nausea: Secondary | ICD-10-CM | POA: Diagnosis not present

## 2016-11-01 DIAGNOSIS — Z6839 Body mass index (BMI) 39.0-39.9, adult: Secondary | ICD-10-CM | POA: Diagnosis not present

## 2016-11-01 DIAGNOSIS — R52 Pain, unspecified: Secondary | ICD-10-CM | POA: Diagnosis not present

## 2016-11-01 DIAGNOSIS — Z713 Dietary counseling and surveillance: Secondary | ICD-10-CM | POA: Diagnosis not present

## 2016-11-01 DIAGNOSIS — R140 Abdominal distension (gaseous): Secondary | ICD-10-CM | POA: Diagnosis not present

## 2016-11-01 DIAGNOSIS — E119 Type 2 diabetes mellitus without complications: Secondary | ICD-10-CM | POA: Diagnosis not present

## 2016-11-01 DIAGNOSIS — R635 Abnormal weight gain: Secondary | ICD-10-CM | POA: Diagnosis not present

## 2016-11-01 DIAGNOSIS — R197 Diarrhea, unspecified: Secondary | ICD-10-CM | POA: Diagnosis not present

## 2016-11-01 DIAGNOSIS — C250 Malignant neoplasm of head of pancreas: Secondary | ICD-10-CM | POA: Diagnosis not present

## 2016-11-01 DIAGNOSIS — F0390 Unspecified dementia without behavioral disturbance: Secondary | ICD-10-CM | POA: Diagnosis not present

## 2016-11-01 DIAGNOSIS — Z79899 Other long term (current) drug therapy: Secondary | ICD-10-CM | POA: Diagnosis not present

## 2016-11-03 ENCOUNTER — Other Ambulatory Visit: Payer: Self-pay | Admitting: Internal Medicine

## 2016-11-03 ENCOUNTER — Encounter: Payer: Self-pay | Admitting: Internal Medicine

## 2016-11-03 ENCOUNTER — Ambulatory Visit (INDEPENDENT_AMBULATORY_CARE_PROVIDER_SITE_OTHER): Payer: PPO | Admitting: Internal Medicine

## 2016-11-03 VITALS — BP 118/60 | HR 54 | Temp 99.0°F | Ht 62.0 in | Wt 210.0 lb

## 2016-11-03 DIAGNOSIS — C250 Malignant neoplasm of head of pancreas: Secondary | ICD-10-CM | POA: Diagnosis not present

## 2016-11-03 DIAGNOSIS — N3000 Acute cystitis without hematuria: Secondary | ICD-10-CM | POA: Diagnosis not present

## 2016-11-03 DIAGNOSIS — F322 Major depressive disorder, single episode, severe without psychotic features: Secondary | ICD-10-CM | POA: Diagnosis not present

## 2016-11-03 DIAGNOSIS — R10819 Abdominal tenderness, unspecified site: Secondary | ICD-10-CM | POA: Diagnosis not present

## 2016-11-03 LAB — POCT URINALYSIS DIPSTICK
BILIRUBIN UA: NEGATIVE
Blood, UA: NEGATIVE
Ketones, UA: NEGATIVE
NITRITE UA: POSITIVE
Protein, UA: NEGATIVE
SPEC GRAV UA: 1.015 (ref 1.010–1.025)
Urobilinogen, UA: 0.2 E.U./dL
pH, UA: 6 (ref 5.0–8.0)

## 2016-11-03 MED ORDER — SERTRALINE HCL 50 MG PO TABS: 50.0000 mg | ORAL_TABLET | Freq: Every day | ORAL | 3 refills | Status: DC

## 2016-11-03 MED ORDER — TRAMADOL HCL 50 MG PO TABS: 50.0000 mg | ORAL_TABLET | Freq: Four times a day (QID) | ORAL | 0 refills | Status: DC | PRN

## 2016-11-03 MED ORDER — NITROFURANTOIN MONOHYD MACRO 100 MG PO CAPS: 100.0000 mg | ORAL_CAPSULE | Freq: Two times a day (BID) | ORAL | 0 refills | Status: DC

## 2016-11-03 MED ORDER — OXYCODONE HCL 5 MG PO TABS: 5.0000 mg | ORAL_TABLET | Freq: Two times a day (BID) | ORAL | 0 refills | Status: DC | PRN

## 2016-11-03 NOTE — Assessment & Plan Note (Signed)
Rx for macrobid. U/A with signs of UTI in the office. Possibly this will alleviate some abdominal pain that is acute.

## 2016-11-03 NOTE — Progress Notes (Signed)
   Subjective:    Patient ID: Melissa Myers, female    DOB: 29-Sep-1945, 71 y.o.   MRN: [id number]  HPI The patient is a 71 YO female coming in for possible UTI. She is having frequency. She also has dark urine and some odor. She denies fevers or chills. Currently dealing with metastatic pancreatic cancer. She is fatigued.  They are slightly depressed because they recently had a scan which showed that her chemo did not shrink her tumor at all in the pancreas/spleen. She is in chronic abdominal pain at this time. She is having labile sugars as well (likely due to pancreas destruction). She is using oxycodone up to 2 times per day but she is out. She also has tramadol which she uses when she is not in as much pain.  She is depressed and her celexa is not helping at all. She would like to try something different. Her daughter has used zoloft with good relief and they would like to switch. She denies SI/HI. She is having fatigue, depression, crying, anxiety.   Review of Systems  Constitutional: Positive for activity change, appetite change and fatigue. Negative for chills, fever and unexpected weight change.  HENT: Negative.   Eyes: Negative.   Respiratory: Negative.   Cardiovascular: Negative.   Gastrointestinal: Positive for abdominal distention, abdominal pain and nausea. Negative for anal bleeding, blood in stool, constipation and diarrhea.  Musculoskeletal: Positive for arthralgias and back pain.  Skin: Negative.   Neurological: Positive for weakness. Negative for dizziness, light-headedness and numbness.  Psychiatric/Behavioral: Positive for decreased concentration, dysphoric mood and sleep disturbance. Negative for self-injury and suicidal ideas. The patient is nervous/anxious.       Objective:   Physical Exam  Constitutional: She is oriented to person, place, and time. She appears well-developed and well-nourished. She appears distressed.  HENT:  Head: Normocephalic and atraumatic.    Eyes: EOM are normal.  Neck: Normal range of motion.  Cardiovascular: Normal rate and regular rhythm.   Pulmonary/Chest: Effort normal and breath sounds normal.  Abdominal: Soft. She exhibits no distension and no mass. There is tenderness. There is no rebound and no guarding.  Musculoskeletal: She exhibits edema.  Chronic venous stasis and color change bilateral lower legs  Neurological: She is alert and oriented to person, place, and time. Coordination abnormal.  Wheelchair for ambulation  Skin: Skin is warm and dry.   Vitals:   11/03/16 1429  BP: 118/60  Pulse: (!) 54  Temp: 99 F (37.2 C)  TempSrc: Oral  SpO2: 99%  Weight: 210 lb (95.3 kg)  Height: 5\' 2"  (1.575 m)      Assessment & Plan:

## 2016-11-03 NOTE — Patient Instructions (Addendum)
We have refilled the oxycodone and the tramadol for the pain.   We have sent in zoloft to relace the celexa (citalopram). Stop taking citalopram. Take 1 pill of zoloft daily.   You do have a bladder infection. Take 1 pill macrobid twice a day for 1 week to clear the infection.

## 2016-11-03 NOTE — Assessment & Plan Note (Signed)
Underwent chemo at [facility] and no change to size in tumor. Rx for oxycodone for abdominal pain and for tramadol. Counseled about not taking both at the same time and she is well aware of that. Goal at this time is pain control and QOL.

## 2016-11-03 NOTE — Assessment & Plan Note (Signed)
Change celexa to zoloft per their request. They are not interested in counseling at this time. She is currently suffering from metastatic pancreatic cancer and would like to ease her suffering if possible.

## 2016-11-10 ENCOUNTER — Emergency Department (HOSPITAL_COMMUNITY)
Admission: EM | Admit: 2016-11-10 | Discharge: 2016-11-10 | Disposition: A | Discharge status: Home / Self Care | Payer: PPO | Attending: Emergency Medicine | Admitting: Emergency Medicine

## 2016-11-10 ENCOUNTER — Encounter (HOSPITAL_COMMUNITY): Payer: Self-pay | Admitting: Emergency Medicine

## 2016-11-10 ENCOUNTER — Telehealth: Payer: Self-pay | Admitting: Internal Medicine

## 2016-11-10 ENCOUNTER — Emergency Department (HOSPITAL_COMMUNITY): Payer: PPO

## 2016-11-10 DIAGNOSIS — R1010 Upper abdominal pain, unspecified: Secondary | ICD-10-CM | POA: Diagnosis not present

## 2016-11-10 DIAGNOSIS — Z79899 Other long term (current) drug therapy: Secondary | ICD-10-CM | POA: Insufficient documentation

## 2016-11-10 DIAGNOSIS — Z794 Long term (current) use of insulin: Secondary | ICD-10-CM | POA: Diagnosis not present

## 2016-11-10 DIAGNOSIS — K5903 Drug induced constipation: Secondary | ICD-10-CM | POA: Insufficient documentation

## 2016-11-10 DIAGNOSIS — I110 Hypertensive heart disease with heart failure: Secondary | ICD-10-CM | POA: Diagnosis not present

## 2016-11-10 DIAGNOSIS — I5030 Unspecified diastolic (congestive) heart failure: Secondary | ICD-10-CM | POA: Insufficient documentation

## 2016-11-10 DIAGNOSIS — R109 Unspecified abdominal pain: Secondary | ICD-10-CM | POA: Diagnosis not present

## 2016-11-10 DIAGNOSIS — Z7982 Long term (current) use of aspirin: Secondary | ICD-10-CM | POA: Diagnosis not present

## 2016-11-10 DIAGNOSIS — E119 Type 2 diabetes mellitus without complications: Secondary | ICD-10-CM | POA: Insufficient documentation

## 2016-11-10 DIAGNOSIS — Z9049 Acquired absence of other specified parts of digestive tract: Secondary | ICD-10-CM | POA: Diagnosis not present

## 2016-11-10 DIAGNOSIS — R079 Chest pain, unspecified: Secondary | ICD-10-CM | POA: Diagnosis not present

## 2016-11-10 LAB — URINALYSIS, ROUTINE W REFLEX MICROSCOPIC
GLUCOSE, UA: NEGATIVE mg/dL
HGB URINE DIPSTICK: NEGATIVE
Ketones, ur: NEGATIVE mg/dL
Nitrite: NEGATIVE
Protein, ur: NEGATIVE mg/dL
Specific Gravity, Urine: 1.015 (ref 1.005–1.030)
pH: 5.5 (ref 5.0–8.0)

## 2016-11-10 LAB — COMPREHENSIVE METABOLIC PANEL
ALK PHOS: 461 U/L — AB (ref 38–126)
ALT: 21 U/L (ref 14–54)
AST: 24 U/L (ref 15–41)
Albumin: 2.4 g/dL — ABNORMAL LOW (ref 3.5–5.0)
Anion gap: 12 (ref 5–15)
BILIRUBIN TOTAL: 0.8 mg/dL (ref 0.3–1.2)
BUN: 22 mg/dL — AB (ref 6–20)
CALCIUM: 9 mg/dL (ref 8.9–10.3)
CO2: 25 mmol/L (ref 22–32)
CREATININE: 0.96 mg/dL (ref 0.44–1.00)
Chloride: 98 mmol/L — ABNORMAL LOW (ref 101–111)
GFR calc Af Amer: >60 mL/min (ref >60)
GFR, EST NON AFRICAN AMERICAN: 58 mL/min — AB (ref >60)
Glucose, Bld: 230 mg/dL — ABNORMAL HIGH (ref 65–99)
Potassium: 4.8 mmol/L (ref 3.5–5.1)
Sodium: 135 mmol/L (ref 135–145)
Total Protein: 6.3 g/dL — ABNORMAL LOW (ref 6.5–8.1)

## 2016-11-10 LAB — CBG MONITORING, ED
GLUCOSE-CAPILLARY: 195 mg/dL — AB (ref 65–99)
GLUCOSE-CAPILLARY: 187 mg/dL — AB (ref 65–99)

## 2016-11-10 LAB — URINALYSIS, MICROSCOPIC (REFLEX)

## 2016-11-10 LAB — CBC
HCT: 31.3 % — ABNORMAL LOW (ref 36.0–46.0)
Hemoglobin: 9.9 g/dL — ABNORMAL LOW (ref 12.0–15.0)
MCH: 26.9 pg (ref 26.0–34.0)
MCHC: 31.6 g/dL (ref 30.0–36.0)
MCV: 85.1 fL (ref 78.0–100.0)
PLATELETS: 381 10*3/uL (ref 150–400)
RBC: 3.68 MIL/uL — ABNORMAL LOW (ref 3.87–5.11)
RDW: 19.8 % — AB (ref 11.5–15.5)
WBC: 8.8 10*3/uL (ref 4.0–10.5)

## 2016-11-10 LAB — LIPASE, BLOOD: Lipase: 16 U/L (ref 11–51)

## 2016-11-10 MED ORDER — INSULIN ASPART 100 UNIT/ML ~~LOC~~ SOLN
4.0000 [IU] | Freq: Once | SUBCUTANEOUS | Status: AC
  Administered 2016-11-10: 4 [IU] via SUBCUTANEOUS
  Filled 2016-11-10: qty 1

## 2016-11-10 MED ORDER — IOPAMIDOL (ISOVUE-370) INJECTION 76%
INTRAVENOUS | Status: AC
  Administered 2016-11-10: 100 mL
  Filled 2016-11-10: qty 100

## 2016-11-10 NOTE — Discharge Instructions (Signed)
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 YOU ARE CONSTIPATED ON CT SCAN. START TAKING COLACE 1 CAPSULE DAILY AND TAKE MIRALAX 1 CAPFUL MIXED IN DRINK ONE TO THREE TIMES DAILY AS NEEDED TO HAVE SOFT STOOLS. FOLLOW UP WITH YOUR PRIMARY CARE PROVIDER IN 3-4 DAYS.

## 2016-11-10 NOTE — Telephone Encounter (Signed)
Patient Name: Melissa Myers  DOB: April 03, 1945    Initial Comment Caller states her mother needs to be admitted in the hospital. She has pancreatic cancer. She is having severe bellybutton pain. Her urine is brown. She is very week. She is taking oxycodone and not touching the pain. She is having severe abdominal pain.    Nurse Assessment  Nurse: Raphael Gibney, RN, [place] Date/Time (Eastern Time): 11/10/2016 12:03:13 PM  Confirm and document reason for call. If symptomatic, describe symptoms. ---Caller states mother is having abd pain. Urine is brown. Has pancreatic cancer. Oxycodone is not helping. Having pain around her navel and is radiating to her back. Pain level 8. She was diagnosed with UTI on Friday. She is on chemo  Does the patient have any new or worsening symptoms? ---Yes  Will a triage be completed? ---Yes  Related visit to physician within the last 2 weeks? ---No  Does the PT have any chronic conditions? (i.e. diabetes, asthma, etc.) ---Yes  List chronic conditions. ---pancreatic cancer; CHF; she is on chemo  Is this a behavioral health or substance abuse call? ---No     Guidelines    Guideline Title Affirmed Question Affirmed Notes  Abdominal Pain - Female [1] SEVERE pain (e.g., excruciating) AND [2] present > 1 hour    Final Disposition User   Go to ED Now Raphael Gibney, RN, [facility]   Caller Disagree/Comply Comply  Caller Understands Yes  PreDisposition Call Doctor

## 2016-11-10 NOTE — ED Notes (Signed)
[place] & Catheryn Bacon provided

## 2016-11-10 NOTE — ED Notes (Signed)
Patient transported to CT 

## 2016-11-10 NOTE — ED Triage Notes (Signed)
Pt states for the last week she has had pain right in the middle of her abd in her belly button. Pt has hx of pancreatic cancer and is no longer receiving chemotherapy. Pt denies any n/v/d.

## 2016-11-10 NOTE — ED Provider Notes (Signed)
[facility] DEPT Provider Note   CSN: [id number] Arrival date & time: 11/10/16  1238     History   Chief Complaint Chief Complaint  Patient presents with  . Abdominal Pain    HPI Melissa Myers is a 71 y.o. female.  71 year old female with past medical history including pancreatic cancer, dCHF, IDDM, OSA who p/w abdominal pain. Family assisting with history as patient does have some memory problems. They report that the patient has complained of central abdominal pain for a while now but it has been worse over the past 3 weeks. She states that it is always there but occasionally becomes worse. She denies any associated vomiting or diarrhea but has complained of nausea before. Family notes decreased appetite. No fevers or cough/cold symptoms. She denies any chest pain, breathing problems, or urinary symptoms although family member notes that she is currently on antibiotics for a UTI. She had previously received chemotherapy for pancreatic cancer but a few weeks ago they decided not to give her the last round of it. Family notes that her oxygen level has been lower occasionally at home over the past 2 days.   The history is provided by the patient and a relative.    Past Medical History:  Diagnosis Date  . Anemia   . Cellulitis    LOWER EXTREMITIES  . Chronic diarrhea    a/w nausea - felt related to IBS  . Deaf    left side only  . Diastolic CHF ([place])   . Disc degeneration, lumbar   . GERD (gastroesophageal reflux disease)   . Hyperlipidemia    hx rhabdo on statins  . Hypertension   . Neuropathy    feet, toes and fingers  . On home oxygen therapy    uses oxygen 2 liters min per [place] at night and prn during day  . OSA (obstructive sleep apnea)    05/2009 sleep study - refuses CPAP  . Osteoarthritis   . RLS (restless legs syndrome)   . Shortness of breath    chronic  . Stasis dermatitis   . Type II or unspecified type diabetes mellitus without mention of  complication, not stated as uncontrolled    insulin dep    Patient Active Problem List   Diagnosis Date Noted  . UTI (urinary tract infection) 05/19/2016  . Goals of care, counseling/discussion   . Palliative care by specialist   . Malignant neoplasm of head of pancreas ([place])   . Cholangitis 05/05/2016  . Common bile duct stricture   . Obstructive jaundice   . Hyperbilirubinemia 03/15/2016  . Diabetic peripheral neuropathy ([place]) 02/23/2016  . Murmur, cardiac 07/02/2015  . Chronic pain syndrome 05/05/2015  . MDD (major depressive disorder), single episode, severe ([place]) 05/05/2015  . Chronic anemia 04/14/2015  . IDDM (insulin dependent diabetes mellitus) ([place]) 03/19/2015  . Hyperlipidemia 12/23/2014  . Venous stasis ulcers of both lower extremities ([place]) 01/04/2011  . Chronic diastolic heart failure ([place]) 06/01/2009  . Vitamin D deficiency 05/12/2009  . Sleep apnea 04/29/2009  . Dyslipidemia 04/20/2009  . Morbid obesity ([place]) 04/20/2009  . RESTLESS LEGS SYNDROME 04/20/2009  . Peripheral neuropathy 04/20/2009  . Essential hypertension 04/20/2009    Past Surgical History:  Procedure Laterality Date  . BILIARY STENT PLACEMENT N/A 05/06/2016   Procedure: BILIARY STENT PLACEMENT;  Surgeon: Gatha Mayer, MD;  Location: [facility];  Service: Endoscopy;  Laterality: N/A;  . CHOLECYSTECTOMY  1997  . COLONOSCOPY N/A 12/03/2012   Procedure: COLONOSCOPY;  Surgeon: Lafayette Dragon, MD;  Location: [facility];  Service: Endoscopy;  Laterality: N/A;  . ENDOSCOPIC RETROGRADE CHOLANGIOPANCREATOGRAPHY (ERCP) WITH PROPOFOL N/A 05/06/2016   Procedure: ENDOSCOPIC RETROGRADE CHOLANGIOPANCREATOGRAPHY (ERCP) WITH PROPOFOL;  Surgeon: Gatha Mayer, MD;  Location: [facility];  Service: Endoscopy;  Laterality: N/A;  . ERCP N/A 03/18/2016   Procedure: ENDOSCOPIC RETROGRADE CHOLANGIOPANCREATOGRAPHY (ERCP);  Surgeon: Irene Shipper, MD;  Location: [facility];  Service: Endoscopy;  Laterality: N/A;  .  EUS N/A 04/28/2016   Procedure: UPPER ENDOSCOPIC ULTRASOUND (EUS) LINEAR;  Surgeon: [name], MD;  Location: [facility];  Service: Endoscopy;  Laterality: N/A;  . IR CHOLANGIOGRAM EXISTING TUBE  06/09/2016  . IR GENERIC HISTORICAL  05/07/2016   IR BILIARY DRAIN PLACEMENT WITH CHOLANGIOGRAM 05/07/2016 Jacqulynn Cadet, MD [facility]  . IR GENERIC HISTORICAL  05/19/2016   IR BILIARY STENT(S) EXISTING ACCESS INC DILATION CATH EXCHANGE 05/19/2016 Jacqulynn Cadet, MD [facility]  . TONSILLECTOMY  1970  . TUBAL LIGATION  1980  . UMBILICAL HERNIA REPAIR  1995  . uterine polyp removal  2008    OB History    No data available       Home Medications    Prior to Admission medications   Medication Sig Start Date End Date Taking? Authorizing Provider  aspirin EC 81 MG tablet Take 1 tablet (81 mg total) by mouth daily. 07/08/14   Rowe Clack, MD  carvedilol (COREG) 25 MG tablet TAKE 1 TABLET BY MOUTH TWICE DAILY WITH FOOD 01/20/16   Bensimhon, Shaune Pascal, MD  Cholecalciferol (VITAMIN D3) 2000 units capsule Take 1 capsule (2,000 Units total) by mouth daily. 06/17/15   Janith Lima, MD  docusate sodium (COLACE) 100 MG capsule Take 1 capsule (100 mg total) by mouth every 12 (twelve) hours. 08/25/16   Milton Ferguson, MD  ezetimibe (ZETIA) 10 MG tablet Take 1 tablet (10 mg total) by mouth daily. 08/26/16   [name], MD  gabapentin (NEURONTIN) 300 MG capsule TAKE TWO CAPSULES FOUR TIMES DAILY 09/16/16   Golden Circle, FNP  hydrALAZINE (APRESOLINE) 25 MG tablet TAKE 1 TABLET (25 MG TOTAL) BY MOUTH 3 (THREE) TIMES DAILY. 02/16/16   Bensimhon, Shaune Pascal, MD  insulin aspart (NOVOLOG) 100 UNIT/ML injection Inject 0-20 Units into the skin 3 (three) times daily with meals. . CBG 70 - 120: 0 units CBG 121 - 150: 3 units CBG 151 - 200: 4 units CBG 201 - 250: 7 units CBG 251 - 300: 11 units CBG 301 - 350: 15 units CBG 351 - 400: 20 units 03/21/16   Mariel Aloe, MD  insulin  glargine (LANTUS) 100 UNIT/ML injection Inject 0.3 mLs (30 Units total) into the skin daily. 03/22/16   Mariel Aloe, MD  lactulose (CHRONULAC) 10 GM/15ML solution Take 10 g by mouth daily as needed for mild constipation.    [provider]  lidocaine-prilocaine (EMLA) cream Apply to port site prior to being accessed 09/20/16   [provider]  lipase/protease/amylase (CREON) 36000 UNITS CPEP capsule 2 po prior to snacks; 4 po prior to meals 09/20/16   [provider]  LYRICA 200 MG capsule TAKE ONE CAPSULE TWICE A DAY 10/25/16   Golden Circle, FNP  meclizine (ANTIVERT) 25 MG tablet Take by mouth.    [provider]  metFORMIN (GLUCOPHAGE) 500 MG tablet Take 500 mg by mouth daily with breakfast.    [provider]  nitrofurantoin, macrocrystal-monohydrate, (MACROBID) 100 MG capsule Take  1 capsule (100 mg total) by mouth 2 (two) times daily. 11/03/16   [name], MD  ondansetron [facility]) 8 MG tablet Take by mouth. 09/20/16   [provider]  [facility] VERIO test strip USE AS INSTRUCTED UP TO 4 TIMES DAILY 11/03/16   [name], MD  oxyCODONE (OXY IR/ROXICODONE) 5 MG immediate release tablet Take 1 tablet (5 mg total) by mouth 2 (two) times daily as needed for severe pain. 11/03/16   [name], MD  potassium chloride SA (K-DUR,KLOR-CON) 20 MEQ tablet Take 2 tablets (40 mEq total) by mouth 2 (two) times daily. 06/17/16   Larey Dresser, MD  prochlorperazine (COMPAZINE) 10 MG tablet Take by mouth. 09/06/16   [provider]  rOPINIRole (REQUIP XL) 4 MG 24 hr tablet TAKE ONE TABLET AT BEDTIME 10/20/16   [name], MD  sertraline (ZOLOFT) 50 MG tablet Take 1 tablet (50 mg total) by mouth daily. 11/03/16   [name], MD  spironolactone (ALDACTONE) 25 MG tablet Take 1 tablet (25 mg total) by mouth daily. 06/17/16 09/15/16  Larey Dresser, MD  torsemide (DEMADEX) 100 MG tablet Take 0.5-1  tablets (50-100 mg total) by mouth as directed. Take one tab (100 mg) in the AM and take one half tab (50 mg) in the PM 06/17/16 09/15/16  Larey Dresser, MD  traMADol (ULTRAM) 50 MG tablet Take 1 tablet (50 mg total) by mouth every 6 (six) hours as needed. 11/03/16   [name], MD    Family History Family History  Problem Relation Age of Onset  . Heart disease Mother   . Heart attack Mother 70  . Heart disease Father   . Heart attack Father 67  . Heart disease Unknown        family history  . Stomach cancer Paternal Grandmother   . Lung cancer Paternal Grandfather   . CVA Unknown        several aunts  . Heart attack Unknown        several aunts and an uncle  . Colon cancer Neg Hx     Social History Social History  Substance Use Topics  . Smoking status: Never Smoker  . Smokeless tobacco: Never Used  . Alcohol use No     Allergies   Morphine; Statins; Sulfa antibiotics; Cymbalta [duloxetine hcl]; Sulfasalazine; Levemir [insulin detemir]; and Zinc   Review of Systems Review of Systems All other systems reviewed and are negative except that which was mentioned in HPI   Physical Exam Updated Vital Signs BP 99/86   Pulse 62   Temp 98.5 F (36.9 C) (Oral)   Resp 16   SpO2 96%   Physical Exam  Constitutional: She is oriented to person, place, and time. She appears well-developed and well-nourished. No distress.  HENT:  Head: Normocephalic and atraumatic.  dry mucous membranes  Eyes: Pupils are equal, round, and reactive to light. Conjunctivae are normal.  Neck: Neck supple.  Cardiovascular: Normal rate and regular rhythm.   Murmur heard. Pulmonary/Chest: Effort normal and breath sounds normal.  Abdominal: Soft. Bowel sounds are normal. She exhibits no distension. There is tenderness. There is no rebound and no guarding.  LUQ, midepigastric, RUQ tenderness  Musculoskeletal: She exhibits edema (mild BLE).  Neurological: She is alert and oriented to  person, place, and time.  Fluent speech, mild memory impairment  Skin: Skin is warm and dry.  Psychiatric:  Calm, cooperative  Nursing note and vitals reviewed.  ED Treatments / Results  Labs (all labs ordered are listed, but only abnormal results are displayed) Labs Reviewed  COMPREHENSIVE METABOLIC PANEL - Abnormal; Notable for the following:       Result Value   Chloride 98 (*)    Glucose, Bld 230 (*)    BUN 22 (*)    Total Protein 6.3 (*)    Albumin 2.4 (*)    Alkaline Phosphatase 461 (*)    GFR calc non Af Amer 58 (*)    All other components within normal limits  CBC - Abnormal; Notable for the following:    RBC 3.68 (*)    Hemoglobin 9.9 (*)    HCT 31.3 (*)    RDW 19.8 (*)    All other components within normal limits  URINALYSIS, ROUTINE W REFLEX MICROSCOPIC - Abnormal; Notable for the following:    APPearance CLOUDY (*)    Bilirubin Urine SMALL (*)    Leukocytes, UA TRACE (*)    All other components within normal limits  URINALYSIS, MICROSCOPIC (REFLEX) - Abnormal; Notable for the following:    Bacteria, UA FEW (*)    Squamous Epithelial / LPF 6-30 (*)    All other components within normal limits  CBG MONITORING, ED - Abnormal; Notable for the following:    Glucose-Capillary 195 (*)    All other components within normal limits  CBG MONITORING, ED - Abnormal; Notable for the following:    Glucose-Capillary 187 (*)    All other components within normal limits  LIPASE, BLOOD    EKG  EKG Interpretation None       Radiology Ct Angio Chest Pe W/cm &/or Wo Cm  Result Date: 11/10/2016 CLINICAL DATA:  Hypertension, diabetes, chest abdominal pain. History of pancreas cancer. EXAM: CT ANGIOGRAPHY CHEST CT ABDOMEN AND PELVIS WITH CONTRAST TECHNIQUE: Multidetector CT imaging of the chest was performed using the standard protocol during bolus administration of intravenous contrast. Multiplanar CT image reconstructions and MIPs were obtained to evaluate the vascular  anatomy. Multidetector CT imaging of the abdomen and pelvis was performed using the standard protocol during bolus administration of intravenous contrast. CONTRAST:  100 cc Isovue 370 COMPARISON:  06/18/2016 FINDINGS: CTA CHEST FINDINGS Cardiovascular: Pulmonary arteries are well visualized. No significant filling defect or pulmonary embolus demonstrated by CTA. Thoracic aortic atherosclerosis. Three-vessel arch anatomy appearing patent. No dissection or aneurysm. No mediastinal hemorrhage or hematoma. Normal heart size. Native coronary atherosclerosis noted. No pericardial effusion. Right IJ port catheter noted. Mediastinum/Nodes: No adenopathy. Trachea and esophagus unremarkable. Lungs/Pleura: Mild hyperinflation/ emphysema. Scattered small bilateral pulmonary blebs noted diffusely. No acute airspace process, collapse or consolidation. Negative for edema or interstitial disease. No pleural abnormality, effusion or pneumothorax. Trachea and central airways are patent. Musculoskeletal: Diffuse degenerative changes of the thoracic spine. No acute osseous finding. Intact sternum. Review of the MIP images confirms the above findings. CT ABDOMEN and PELVIS FINDINGS Hepatobiliary: Diffuse pneumobilia as before. Remote cholecystectomy. No focal hepatic abnormality. Common bile duct metallic stent evident. Patent hepatic, portal and splenic veins. Pancreas: Interval enlargement of the hypodense hypoenhancing pancreatic head/ uncinate process mass measuring approximately 3.3 x 3.0 cm, image 35. Distal to the lesion there is pancreatic atrophy and ductal dilatation of the pancreas body and tail as before. Pancreatic head mass is now inseparable from the duodenum and appears to contact the right aspect of the superior mesenteric vein. Spleen: Scattered stable hypodensities, suspect cysts.  Stable size. Adrenals/Urinary Tract: Normal adrenal glands. Stable small renal cyst. No  renal obstruction, hydronephrosis, associated  hydroureter, or ureteral calculus. Urinary bladder unremarkable. Stomach/Bowel: Negative for bowel obstruction. Large volume of retained stool throughout colon suggesting constipation. No free air, fluid collection, abscess, or hemorrhage. Vascular/Lymphatic: Aortic atherosclerosis. Negative for aneurysm. Mesenteric or renal vasculature appear patent. No adenopathy. Reproductive: Uterus and bilateral adnexa are unremarkable. Other: Small fat containing left inguinal hernia. Intact abdominal wall. No ventral hernia. No ascites. Musculoskeletal: Degenerative changes of the spine. No acute osseous finding. Review of the MIP images confirms the above findings. IMPRESSION: Negative for significant acute pulmonary embolus by CTA. Stable emphysema Thoracic aortic atherosclerosis No acute intrathoracic finding Interval enlargement of the known pancreatic head mass consistent with local progression of known pancreatic carcinoma. Stable common bile duct stent with associated pneumobilia No evidence of hepatic metastatic disease. No adenopathy Abdominal aortic atherosclerosis without aneurysm Large volume of colonic formed stool suggesting constipation Electronically Signed   By: Jerilynn Mages.  Shick M.D.   On: 11/10/2016 21:43   Ct Abdomen Pelvis W Contrast  Result Date: 11/10/2016 CLINICAL DATA:  Hypertension, diabetes, chest abdominal pain. History of pancreas cancer. EXAM: CT ANGIOGRAPHY CHEST CT ABDOMEN AND PELVIS WITH CONTRAST TECHNIQUE: Multidetector CT imaging of the chest was performed using the standard protocol during bolus administration of intravenous contrast. Multiplanar CT image reconstructions and MIPs were obtained to evaluate the vascular anatomy. Multidetector CT imaging of the abdomen and pelvis was performed using the standard protocol during bolus administration of intravenous contrast. CONTRAST:  100 cc Isovue 370 COMPARISON:  06/18/2016 FINDINGS: CTA CHEST FINDINGS Cardiovascular: Pulmonary arteries are well  visualized. No significant filling defect or pulmonary embolus demonstrated by CTA. Thoracic aortic atherosclerosis. Three-vessel arch anatomy appearing patent. No dissection or aneurysm. No mediastinal hemorrhage or hematoma. Normal heart size. Native coronary atherosclerosis noted. No pericardial effusion. Right IJ port catheter noted. Mediastinum/Nodes: No adenopathy. Trachea and esophagus unremarkable. Lungs/Pleura: Mild hyperinflation/ emphysema. Scattered small bilateral pulmonary blebs noted diffusely. No acute airspace process, collapse or consolidation. Negative for edema or interstitial disease. No pleural abnormality, effusion or pneumothorax. Trachea and central airways are patent. Musculoskeletal: Diffuse degenerative changes of the thoracic spine. No acute osseous finding. Intact sternum. Review of the MIP images confirms the above findings. CT ABDOMEN and PELVIS FINDINGS Hepatobiliary: Diffuse pneumobilia as before. Remote cholecystectomy. No focal hepatic abnormality. Common bile duct metallic stent evident. Patent hepatic, portal and splenic veins. Pancreas: Interval enlargement of the hypodense hypoenhancing pancreatic head/ uncinate process mass measuring approximately 3.3 x 3.0 cm, image 35. Distal to the lesion there is pancreatic atrophy and ductal dilatation of the pancreas body and tail as before. Pancreatic head mass is now inseparable from the duodenum and appears to contact the right aspect of the superior mesenteric vein. Spleen: Scattered stable hypodensities, suspect cysts.  Stable size. Adrenals/Urinary Tract: Normal adrenal glands. Stable small renal cyst. No renal obstruction, hydronephrosis, associated hydroureter, or ureteral calculus. Urinary bladder unremarkable. Stomach/Bowel: Negative for bowel obstruction. Large volume of retained stool throughout colon suggesting constipation. No free air, fluid collection, abscess, or hemorrhage. Vascular/Lymphatic: Aortic atherosclerosis.  Negative for aneurysm. Mesenteric or renal vasculature appear patent. No adenopathy. Reproductive: Uterus and bilateral adnexa are unremarkable. Other: Small fat containing left inguinal hernia. Intact abdominal wall. No ventral hernia. No ascites. Musculoskeletal: Degenerative changes of the spine. No acute osseous finding. Review of the MIP images confirms the above findings. IMPRESSION: Negative for significant acute pulmonary embolus by CTA. Stable emphysema Thoracic aortic atherosclerosis No acute intrathoracic finding Interval enlargement of the known pancreatic head  mass consistent with local progression of known pancreatic carcinoma. Stable common bile duct stent with associated pneumobilia No evidence of hepatic metastatic disease. No adenopathy Abdominal aortic atherosclerosis without aneurysm Large volume of colonic formed stool suggesting constipation Electronically Signed   By: Jerilynn Mages.  Shick M.D.   On: 11/10/2016 21:43    Procedures Procedures (including critical care time)  Medications Ordered in ED Medications  insulin aspart (novoLOG) injection 4 Units (4 Units Subcutaneous Given 11/10/16 2049)  iopamidol (ISOVUE-370) 76 % injection (100 mLs  Contrast Given 11/10/16 2104)     Initial Impression / Assessment and Plan / ED Course  I have reviewed the triage vital signs and the nursing notes.  Pertinent labs & imaging results that were available during my care of the patient were reviewed by me and considered in my medical decision making (see chart for details).     PT w/ known pancreatic CA p/w several weeks of worsening abd pain. She was comfortable and nontoxic on exam with normal vital signs. Tenderness across upper abdomen. Her lab work shows hyperglycemia without evidence of DKA, normal creatinine, alkaline phosphatase 461. Obtained CT of abdomen and pelvis as well as CT chest to rule out PE given reports of hypoxia.  CTA negative for PE. CT abd shows enlargement of known  pancreatic mass and stable bowel duct stent. She also has large amount of stool in colon. I discussed findings with the patient and her husband and recommended MiraLAX and Colace to treat for constipation. Instructed to follow-up with PCP after initiating constipation medications to see how she is doing from a pain standpoint. Reviewed return precautions and patient discharged in satisfactory condition. Final Clinical Impressions(s) / ED Diagnoses   Final diagnoses:  Upper abdominal pain  Drug-induced constipation    New Prescriptions New Prescriptions   No medications on file     Calyse Murcia, Wenda Overland, MD 11/10/16 2328

## 2016-11-13 ENCOUNTER — Encounter (HOSPITAL_COMMUNITY): Payer: Self-pay | Admitting: *Deleted

## 2016-11-13 ENCOUNTER — Emergency Department (HOSPITAL_COMMUNITY)
Admission: EM | Admit: 2016-11-13 | Discharge: 2016-11-14 | Disposition: A | Discharge status: Home / Self Care | Payer: PPO | Attending: Emergency Medicine | Admitting: Emergency Medicine

## 2016-11-13 DIAGNOSIS — Z79899 Other long term (current) drug therapy: Secondary | ICD-10-CM | POA: Diagnosis not present

## 2016-11-13 DIAGNOSIS — Z794 Long term (current) use of insulin: Secondary | ICD-10-CM | POA: Insufficient documentation

## 2016-11-13 DIAGNOSIS — R109 Unspecified abdominal pain: Secondary | ICD-10-CM | POA: Diagnosis not present

## 2016-11-13 DIAGNOSIS — R1084 Generalized abdominal pain: Secondary | ICD-10-CM | POA: Diagnosis not present

## 2016-11-13 DIAGNOSIS — Z7982 Long term (current) use of aspirin: Secondary | ICD-10-CM | POA: Diagnosis not present

## 2016-11-13 DIAGNOSIS — K5900 Constipation, unspecified: Secondary | ICD-10-CM | POA: Diagnosis not present

## 2016-11-13 DIAGNOSIS — C250 Malignant neoplasm of head of pancreas: Secondary | ICD-10-CM | POA: Insufficient documentation

## 2016-11-13 DIAGNOSIS — E876 Hypokalemia: Secondary | ICD-10-CM

## 2016-11-13 DIAGNOSIS — E119 Type 2 diabetes mellitus without complications: Secondary | ICD-10-CM | POA: Insufficient documentation

## 2016-11-13 DIAGNOSIS — Z8507 Personal history of malignant neoplasm of pancreas: Secondary | ICD-10-CM

## 2016-11-13 DIAGNOSIS — I5032 Chronic diastolic (congestive) heart failure: Secondary | ICD-10-CM | POA: Insufficient documentation

## 2016-11-13 DIAGNOSIS — I110 Hypertensive heart disease with heart failure: Secondary | ICD-10-CM | POA: Diagnosis not present

## 2016-11-13 DIAGNOSIS — R140 Abdominal distension (gaseous): Secondary | ICD-10-CM | POA: Diagnosis not present

## 2016-11-13 NOTE — ED Triage Notes (Signed)
Pt bib EMS and reports being constipated x 5 days.  Pt was recently seen at [facility] last Wednesday.  An x-ray was done at that visit that said pt was constipated.  Pt has pancreatic cancer and takes chemo at [place]. Pt has taken colace and miralax for a days.

## 2016-11-13 NOTE — ED Triage Notes (Signed)
Pt had a bowel movement and the stool was white.  Pt denies feeling any better.

## 2016-11-14 ENCOUNTER — Emergency Department (HOSPITAL_COMMUNITY): Payer: PPO

## 2016-11-14 ENCOUNTER — Other Ambulatory Visit: Payer: Self-pay | Admitting: Family

## 2016-11-14 DIAGNOSIS — R109 Unspecified abdominal pain: Secondary | ICD-10-CM | POA: Diagnosis not present

## 2016-11-14 LAB — CBC WITH DIFFERENTIAL/PLATELET
BASOS ABS: 0 10*3/uL (ref 0.0–0.1)
Basophils Relative: 0 %
EOS ABS: 0.1 10*3/uL (ref 0.0–0.7)
Eosinophils Relative: 1 %
HEMATOCRIT: 24.1 % — AB (ref 36.0–46.0)
HEMOGLOBIN: 8 g/dL — AB (ref 12.0–15.0)
Lymphocytes Relative: 26 %
Lymphs Abs: 2.2 10*3/uL (ref 0.7–4.0)
MCH: 27.1 pg (ref 26.0–34.0)
MCHC: 33.2 g/dL (ref 30.0–36.0)
MCV: 81.7 fL (ref 78.0–100.0)
MONO ABS: 1.1 10*3/uL — AB (ref 0.1–1.0)
Monocytes Relative: 13 %
NEUTROS ABS: 5.1 10*3/uL (ref 1.7–7.7)
Neutrophils Relative %: 60 %
PLATELETS: 221 10*3/uL (ref 150–400)
RBC: 2.95 MIL/uL — AB (ref 3.87–5.11)
RDW: 18.3 % — ABNORMAL HIGH (ref 11.5–15.5)
WBC: 8.5 10*3/uL (ref 4.0–10.5)

## 2016-11-14 LAB — COMPREHENSIVE METABOLIC PANEL
ALK PHOS: 280 U/L — AB (ref 38–126)
ALT: 11 U/L — ABNORMAL LOW (ref 14–54)
ANION GAP: 9 (ref 5–15)
AST: 13 U/L — AB (ref 15–41)
Albumin: 2 g/dL — ABNORMAL LOW (ref 3.5–5.0)
BILIRUBIN TOTAL: 0.7 mg/dL (ref 0.3–1.2)
BUN: 25 mg/dL — AB (ref 6–20)
CALCIUM: 6.6 mg/dL — AB (ref 8.9–10.3)
CO2: 22 mmol/L (ref 22–32)
Chloride: 105 mmol/L (ref 101–111)
Creatinine, Ser: 1.08 mg/dL — ABNORMAL HIGH (ref 0.44–1.00)
GFR calc Af Amer: 58 mL/min — ABNORMAL LOW (ref >60)
GFR, EST NON AFRICAN AMERICAN: 50 mL/min — AB (ref >60)
Glucose, Bld: 124 mg/dL — ABNORMAL HIGH (ref 65–99)
POTASSIUM: 2.9 mmol/L — AB (ref 3.5–5.1)
Sodium: 136 mmol/L (ref 135–145)
TOTAL PROTEIN: 5.2 g/dL — AB (ref 6.5–8.1)

## 2016-11-14 LAB — POC OCCULT BLOOD, ED: Fecal Occult Bld: NEGATIVE

## 2016-11-14 LAB — LIPASE, BLOOD: LIPASE: 11 U/L (ref 11–51)

## 2016-11-14 MED ORDER — SODIUM CHLORIDE 0.9 % IV BOLUS (SEPSIS)
1000.0000 mL | Freq: Once | INTRAVENOUS | Status: AC
  Administered 2016-11-14: 1000 mL via INTRAVENOUS

## 2016-11-14 MED ORDER — LORAZEPAM 2 MG/ML IJ SOLN
1.0000 mg | Freq: Once | INTRAMUSCULAR | Status: AC
  Administered 2016-11-14: 1 mg via INTRAVENOUS
  Filled 2016-11-14: qty 1

## 2016-11-14 MED ORDER — MAGNESIUM CITRATE PO SOLN
1.0000 | Freq: Once | ORAL | Status: AC
  Administered 2016-11-14: 1 via ORAL
  Filled 2016-11-14: qty 296

## 2016-11-14 MED ORDER — POTASSIUM CHLORIDE CRYS ER 20 MEQ PO TBCR
40.0000 meq | EXTENDED_RELEASE_TABLET | Freq: Once | ORAL | Status: AC
  Administered 2016-11-14: 40 meq via ORAL
  Filled 2016-11-14: qty 2

## 2016-11-14 NOTE — ED Notes (Signed)
Unable to collect labs patient is in xray 

## 2016-11-14 NOTE — Discharge Instructions (Signed)
Magnesium citrate: drink entire 10 ounce bottle mixed with equal parts Sprite or Gatorade for relief of constipation.  Continue your medications as previously prescribed.  Return to the emergency department if you develop worsening pain, high fever, bloody stools, or other new and concerning symptoms.

## 2016-11-14 NOTE — ED Notes (Signed)
Pt c/o restless legs. MD notified and Ativan ordered.

## 2016-11-14 NOTE — ED Provider Notes (Signed)
[place] DEPT Provider Note   CSN: [id number] Arrival date & time: 11/13/16  2128     History   Chief Complaint No chief complaint on file.   HPI Melissa Myers is a 71 y.o. female.  Patient is a 71 year old female with past medical history of pancreatic cancer status post round one of chemotherapy performed at [facility]. Her last dose of chemotherapy was 2 weeks ago. She presents today for evaluation of constipation. She reports not having a bowel movement in approximately 5 days. She feels bloated and uncomfortable. She denies any fevers or chills. She denies any rectal bleeding. She was seen here 4 days ago with similar complaints. She was discharged with MiraLAX and Colace, however has not improved. This evening she reports having a small bowel movement that she describes as white in color, but has had no relief.   The history is provided by the patient.    Past Medical History:  Diagnosis Date  . Anemia   . Cellulitis    LOWER EXTREMITIES  . Chronic diarrhea    a/w nausea - felt related to IBS  . Deaf    left side only  . Diastolic CHF ([place])   . Disc degeneration, lumbar   . GERD (gastroesophageal reflux disease)   . Hyperlipidemia    hx rhabdo on statins  . Hypertension   . Neuropathy    feet, toes and fingers  . On home oxygen therapy    uses oxygen 2 liters min per [place] at night and prn during day  . OSA (obstructive sleep apnea)    05/2009 sleep study - refuses CPAP  . Osteoarthritis   . RLS (restless legs syndrome)   . Shortness of breath    chronic  . Stasis dermatitis   . Type II or unspecified type diabetes mellitus without mention of complication, not stated as uncontrolled    insulin dep    Patient Active Problem List   Diagnosis Date Noted  . UTI (urinary tract infection) 05/19/2016  . Goals of care, counseling/discussion   . Palliative care by specialist   . Malignant neoplasm of head of pancreas ([place])   . Cholangitis 05/05/2016  . Common  bile duct stricture   . Obstructive jaundice   . Hyperbilirubinemia 03/15/2016  . Diabetic peripheral neuropathy ([place]) 02/23/2016  . Murmur, cardiac 07/02/2015  . Chronic pain syndrome 05/05/2015  . MDD (major depressive disorder), single episode, severe ([place]) 05/05/2015  . Chronic anemia 04/14/2015  . IDDM (insulin dependent diabetes mellitus) ([place]) 03/19/2015  . Hyperlipidemia 12/23/2014  . Venous stasis ulcers of both lower extremities ([place]) 01/04/2011  . Chronic diastolic heart failure ([place]) 06/01/2009  . Vitamin D deficiency 05/12/2009  . Sleep apnea 04/29/2009  . Dyslipidemia 04/20/2009  . Morbid obesity ([place]) 04/20/2009  . RESTLESS LEGS SYNDROME 04/20/2009  . Peripheral neuropathy 04/20/2009  . Essential hypertension 04/20/2009    Past Surgical History:  Procedure Laterality Date  . BILIARY STENT PLACEMENT N/A 05/06/2016   Procedure: BILIARY STENT PLACEMENT;  Surgeon: Gatha Mayer, MD;  Location: [place];  Service: Endoscopy;  Laterality: N/A;  . CHOLECYSTECTOMY  1997  . COLONOSCOPY N/A 12/03/2012   Procedure: COLONOSCOPY;  Surgeon: Lafayette Dragon, MD;  Location: [facility];  Service: Endoscopy;  Laterality: N/A;  . ENDOSCOPIC RETROGRADE CHOLANGIOPANCREATOGRAPHY (ERCP) WITH PROPOFOL N/A 05/06/2016   Procedure: ENDOSCOPIC RETROGRADE CHOLANGIOPANCREATOGRAPHY (ERCP) WITH PROPOFOL;  Surgeon: Gatha Mayer, MD;  Location: [place];  Service: Endoscopy;  Laterality: N/A;  .  ERCP N/A 03/18/2016   Procedure: ENDOSCOPIC RETROGRADE CHOLANGIOPANCREATOGRAPHY (ERCP);  Surgeon: Irene Shipper, MD;  Location: [facility];  Service: Endoscopy;  Laterality: N/A;  . EUS N/A 04/28/2016   Procedure: UPPER ENDOSCOPIC ULTRASOUND (EUS) LINEAR;  Surgeon: [name], MD;  Location: [facility];  Service: Endoscopy;  Laterality: N/A;  . IR CHOLANGIOGRAM EXISTING TUBE  06/09/2016  . IR GENERIC HISTORICAL  05/07/2016   IR BILIARY DRAIN PLACEMENT WITH CHOLANGIOGRAM 05/07/2016 Jacqulynn Cadet, MD [facility]  . IR GENERIC HISTORICAL  05/19/2016   IR BILIARY STENT(S) EXISTING ACCESS INC DILATION CATH EXCHANGE 05/19/2016 Jacqulynn Cadet, MD [facility]  . TONSILLECTOMY  1970  . TUBAL LIGATION  1980  . UMBILICAL HERNIA REPAIR  1995  . uterine polyp removal  2008    OB History    No data available       Home Medications    Prior to Admission medications   Medication Sig Start Date End Date Taking? Authorizing Provider  aspirin EC 81 MG tablet Take 1 tablet (81 mg total) by mouth daily. 07/08/14   Rowe Clack, MD  carvedilol (COREG) 25 MG tablet TAKE 1 TABLET BY MOUTH TWICE DAILY WITH FOOD 01/20/16   Bensimhon, Shaune Pascal, MD  Cholecalciferol (VITAMIN D3) 2000 units capsule Take 1 capsule (2,000 Units total) by mouth daily. 06/17/15   Janith Lima, MD  docusate sodium (COLACE) 100 MG capsule Take 1 capsule (100 mg total) by mouth every 12 (twelve) hours. 08/25/16   Milton Ferguson, MD  ezetimibe (ZETIA) 10 MG tablet Take 1 tablet (10 mg total) by mouth daily. 08/26/16   [name], MD  gabapentin (NEURONTIN) 300 MG capsule TAKE TWO CAPSULES FOUR TIMES DAILY 09/16/16   Golden Circle, FNP  hydrALAZINE (APRESOLINE) 25 MG tablet TAKE 1 TABLET (25 MG TOTAL) BY MOUTH 3 (THREE) TIMES DAILY. 02/16/16   Bensimhon, Shaune Pascal, MD  insulin aspart (NOVOLOG) 100 UNIT/ML injection Inject 0-20 Units into the skin 3 (three) times daily with meals. . CBG 70 - 120: 0 units CBG 121 - 150: 3 units CBG 151 - 200: 4 units CBG 201 - 250: 7 units CBG 251 - 300: 11 units CBG 301 - 350: 15 units CBG 351 - 400: 20 units 03/21/16   Mariel Aloe, MD  insulin glargine (LANTUS) 100 UNIT/ML injection Inject 0.3 mLs (30 Units total) into the skin daily. 03/22/16   Mariel Aloe, MD  lactulose (CHRONULAC) 10 GM/15ML solution Take 10 g by mouth daily as needed for mild constipation.    [provider]  lidocaine-prilocaine (EMLA) cream Apply to port site prior to being  accessed 09/20/16   [provider]  lipase/protease/amylase (CREON) 36000 UNITS CPEP capsule 2 po prior to snacks; 4 po prior to meals 09/20/16   [provider]  LYRICA 200 MG capsule TAKE ONE CAPSULE TWICE A DAY 10/25/16   Golden Circle, FNP  meclizine (ANTIVERT) 25 MG tablet Take by mouth.    [provider]  metFORMIN (GLUCOPHAGE) 500 MG tablet Take 500 mg by mouth daily with breakfast.    [provider]  nitrofurantoin, macrocrystal-monohydrate, (MACROBID) 100 MG capsule Take 1 capsule (100 mg total) by mouth 2 (two) times daily. 11/03/16   [name], MD  ondansetron [facility]) 8 MG tablet Take by mouth. 09/20/16   [provider]  [facility] VERIO test strip USE AS INSTRUCTED UP TO 4 TIMES DAILY 11/03/16   [name],  MD  oxyCODONE (OXY IR/ROXICODONE) 5 MG immediate release tablet Take 1 tablet (5 mg total) by mouth 2 (two) times daily as needed for severe pain. 11/03/16   [name], MD  potassium chloride SA (K-DUR,KLOR-CON) 20 MEQ tablet Take 2 tablets (40 mEq total) by mouth 2 (two) times daily. 06/17/16   Larey Dresser, MD  prochlorperazine (COMPAZINE) 10 MG tablet Take by mouth. 09/06/16   [provider]  rOPINIRole (REQUIP XL) 4 MG 24 hr tablet TAKE ONE TABLET AT BEDTIME 10/20/16   [name], MD  sertraline (ZOLOFT) 50 MG tablet Take 1 tablet (50 mg total) by mouth daily. 11/03/16   [name], MD  spironolactone (ALDACTONE) 25 MG tablet Take 1 tablet (25 mg total) by mouth daily. 06/17/16 09/15/16  Larey Dresser, MD  torsemide (DEMADEX) 100 MG tablet Take 0.5-1 tablets (50-100 mg total) by mouth as directed. Take one tab (100 mg) in the AM and take one half tab (50 mg) in the PM 06/17/16 09/15/16  Larey Dresser, MD  traMADol (ULTRAM) 50 MG tablet Take 1 tablet (50 mg total) by mouth every 6 (six) hours as needed. 11/03/16   [name], MD    Family History Family  History  Problem Relation Age of Onset  . Heart disease Mother   . Heart attack Mother 1  . Heart disease Father   . Heart attack Father 85  . Heart disease Unknown        family history  . Stomach cancer Paternal Grandmother   . Lung cancer Paternal Grandfather   . CVA Unknown        several aunts  . Heart attack Unknown        several aunts and an uncle  . Colon cancer Neg Hx     Social History Social History  Substance Use Topics  . Smoking status: Never Smoker  . Smokeless tobacco: Never Used  . Alcohol use No     Allergies   Morphine; Statins; Sulfa antibiotics; Cymbalta [duloxetine hcl]; Sulfasalazine; Levemir [insulin detemir]; and Zinc   Review of Systems Review of Systems  All other systems reviewed and are negative.    Physical Exam Updated Vital Signs BP 117/61   Pulse (!) 49   Temp 98.1 F (36.7 C) (Oral)   Resp 16   SpO2 95%   Physical Exam  Constitutional: She is oriented to person, place, and time. No distress.  Patient is pale.  HENT:  Head: Normocephalic and atraumatic.  Mucous membranes appear dry.  Neck: Normal range of motion. Neck supple.  Cardiovascular: Normal rate and regular rhythm.  Exam reveals no gallop and no friction rub.   No murmur heard. Pulmonary/Chest: Effort normal and breath sounds normal. No respiratory distress. She has no wheezes.  Abdominal: Soft. Bowel sounds are normal. She exhibits no distension. There is no tenderness.  Musculoskeletal: Normal range of motion. She exhibits no edema.  Neurological: She is alert and oriented to person, place, and time. No cranial nerve deficit. Coordination normal.  Skin: Skin is warm and dry. She is not diaphoretic.  Nursing note and vitals reviewed.    ED Treatments / Results  Labs (all labs ordered are listed, but only abnormal results are displayed) Labs Reviewed  COMPREHENSIVE METABOLIC PANEL  LIPASE, BLOOD  CBC WITH DIFFERENTIAL/PLATELET  POC OCCULT BLOOD, ED     EKG  EKG Interpretation None       Radiology No results found.  Procedures Procedures (including critical care time)  Medications Ordered in ED Medications  sodium chloride 0.9 % bolus 1,000 mL (not administered)     Initial Impression / Assessment and Plan / ED Course  I have reviewed the triage vital signs and the nursing notes.  Pertinent labs & imaging results that were available during my care of the patient were reviewed by me and considered in my medical decision making (see chart for details).  Patient is a 71 year old female with history of pancreatic cancer. She presents for evaluation of constipation. She was seen here several days ago and given MiraLAX with minimal results. She continues to feel bloated and uncomfortable. Her workup today reveals no white count and x-rays are suggestive of constipation without fecal impaction. Her rectal examination reveals no stool within the rectal vault. There is no blood or palpable mass.  Her laboratory studies do show a potassium of 2.9. She was given oral replacement here in the ER. She has also had a drop in her hemoglobin, the etiology of which I am uncertain. She does not appear to be having any GI bleeding. Her hemoglobin today is 8 and this will need to be followed up. I see no indication for admission. I will recommend magnesium citrate and have her follow-up with her oncologist.  Final Clinical Impressions(s) / ED Diagnoses   Final diagnoses:  None    New Prescriptions New Prescriptions   No medications on file     Veryl Speak, MD 11/14/16 0150

## 2016-11-15 DIAGNOSIS — R110 Nausea: Secondary | ICD-10-CM | POA: Diagnosis not present

## 2016-11-15 DIAGNOSIS — R52 Pain, unspecified: Secondary | ICD-10-CM | POA: Diagnosis not present

## 2016-11-15 DIAGNOSIS — K8689 Other specified diseases of pancreas: Secondary | ICD-10-CM | POA: Diagnosis not present

## 2016-11-15 DIAGNOSIS — K5900 Constipation, unspecified: Secondary | ICD-10-CM | POA: Diagnosis not present

## 2016-11-15 DIAGNOSIS — E669 Obesity, unspecified: Secondary | ICD-10-CM | POA: Diagnosis not present

## 2016-11-15 DIAGNOSIS — Z794 Long term (current) use of insulin: Secondary | ICD-10-CM | POA: Diagnosis not present

## 2016-11-15 DIAGNOSIS — C250 Malignant neoplasm of head of pancreas: Secondary | ICD-10-CM | POA: Diagnosis not present

## 2016-11-15 DIAGNOSIS — C259 Malignant neoplasm of pancreas, unspecified: Secondary | ICD-10-CM | POA: Diagnosis not present

## 2016-11-15 DIAGNOSIS — E119 Type 2 diabetes mellitus without complications: Secondary | ICD-10-CM | POA: Diagnosis not present

## 2016-11-15 DIAGNOSIS — R410 Disorientation, unspecified: Secondary | ICD-10-CM | POA: Diagnosis not present

## 2016-11-15 DIAGNOSIS — R5381 Other malaise: Secondary | ICD-10-CM | POA: Diagnosis not present

## 2016-11-15 DIAGNOSIS — Z9189 Other specified personal risk factors, not elsewhere classified: Secondary | ICD-10-CM | POA: Diagnosis not present

## 2016-11-15 DIAGNOSIS — R197 Diarrhea, unspecified: Secondary | ICD-10-CM | POA: Diagnosis not present

## 2016-11-15 DIAGNOSIS — Z01818 Encounter for other preprocedural examination: Secondary | ICD-10-CM | POA: Diagnosis not present

## 2016-11-15 DIAGNOSIS — E46 Unspecified protein-calorie malnutrition: Secondary | ICD-10-CM | POA: Diagnosis not present

## 2016-11-15 DIAGNOSIS — Z6838 Body mass index (BMI) 38.0-38.9, adult: Secondary | ICD-10-CM | POA: Diagnosis not present

## 2016-11-17 ENCOUNTER — Encounter: Payer: Self-pay | Admitting: Nurse Practitioner

## 2016-11-17 ENCOUNTER — Ambulatory Visit (INDEPENDENT_AMBULATORY_CARE_PROVIDER_SITE_OTHER): Payer: PPO | Admitting: Nurse Practitioner

## 2016-11-17 VITALS — BP 122/60 | HR 62 | Temp 98.5°F | Ht 62.0 in | Wt 201.0 lb

## 2016-11-17 DIAGNOSIS — G6289 Other specified polyneuropathies: Secondary | ICD-10-CM

## 2016-11-17 DIAGNOSIS — Z79899 Other long term (current) drug therapy: Secondary | ICD-10-CM

## 2016-11-17 DIAGNOSIS — G894 Chronic pain syndrome: Secondary | ICD-10-CM

## 2016-11-17 DIAGNOSIS — I10 Essential (primary) hypertension: Secondary | ICD-10-CM | POA: Diagnosis not present

## 2016-11-17 NOTE — Progress Notes (Signed)
Subjective:  Patient ID: Melissa Myers, female    DOB: 01-24-1946  Age: 71 y.o. MRN: [id number]  CC: Hospitalization Follow-up (hospital follow-back and stomach still in pain-rehab referral for PT and pain management/ medication consult. )   HPI Accompanied by son, caregiver and daughter in law,  Mrs. Polhemus lives with husband. Visited by son and wife. Has caregiver during the day (helps with ADLs and medication). Home with Husband at night (family reports he has declining health and not able to help wife as before). No longer receiving palliative care service per family.  Medication management: Have questions about need for lyrica and gabapentin. Son and oncology in concerned about use of lyrica and gabapentin at same time. Gabapentin was prescribed for peripheral neuropathy, but unsure why lyrica was added 66month ago.  Recent labs done by oncology indicate electrolyte imbalance, hence they are also concerned about torsemide dose. Has not seen cardiology or communicated with since 04/2016.  Inpatient rehab was recommended by oncology with [facility] to possibly improve strength. they are considering surgical intervention for pancreatic cancer. FL2 form needs to be completed and faxed to  [facility] facility.  Outpatient Medications Prior to Visit  Medication Sig Dispense Refill  . aspirin EC 81 MG tablet Take 1 tablet (81 mg total) by mouth daily.    . carvedilol (COREG) 25 MG tablet TAKE 1 TABLET BY MOUTH TWICE DAILY WITH FOOD 60 tablet 5  . Cholecalciferol (VITAMIN D3) 2000 units capsule Take 1 capsule (2,000 Units total) by mouth daily. 90 capsule 3  . ezetimibe (ZETIA) 10 MG tablet Take 1 tablet (10 mg total) by mouth daily. 90 tablet 1  . gabapentin (NEURONTIN) 300 MG capsule TAKE TWO CAPSULES FOUR TIMES DAILY 240 capsule 1  . hydrALAZINE (APRESOLINE) 25 MG tablet TAKE 1 TABLET (25 MG TOTAL) BY MOUTH 3 (THREE) TIMES DAILY. 90 tablet 1  . insulin aspart (NOVOLOG) 100  UNIT/ML injection Inject 0-20 Units into the skin 3 (three) times daily with meals. . CBG 70 - 120: 0 units CBG 121 - 150: 3 units CBG 151 - 200: 4 units CBG 201 - 250: 7 units CBG 251 - 300: 11 units CBG 301 - 350: 15 units CBG 351 - 400: 20 units    . insulin glargine (LANTUS) 100 UNIT/ML injection Inject 0.3 mLs (30 Units total) into the skin daily.    [name] lactulose (CHRONULAC) 10 GM/15ML solution Take 10 g by mouth daily as needed for mild constipation.    . lidocaine-prilocaine (EMLA) cream Apply to port site prior to being accessed    . lipase/protease/amylase (CREON) 36000 UNITS CPEP capsule 2 po prior to snacks; 4 po prior to meals    . meclizine (ANTIVERT) 25 MG tablet Take by mouth.    . metFORMIN (GLUCOPHAGE) 500 MG tablet Take 500 mg by mouth daily with breakfast.    . ondansetron (ZOFRAN) 8 MG tablet Take by mouth.    Glory Rosebush VERIO test strip USE AS INSTRUCTED UP TO 4 TIMES DAILY 200 each 11  . oxyCODONE (OXY IR/ROXICODONE) 5 MG immediate release tablet Take 1 tablet (5 mg total) by mouth 2 (two) times daily as needed for severe pain. 60 tablet 0  . potassium chloride SA (K-DUR,KLOR-CON) 20 MEQ tablet Take 2 tablets (40 mEq total) by mouth 2 (two) times daily. 120 tablet 3  . prochlorperazine (COMPAZINE) 10 MG tablet Take by mouth.    [name] rOPINIRole (REQUIP XL) 4 MG 24 hr tablet TAKE ONE TABLET  AT BEDTIME 30 tablet 5  . traMADol (ULTRAM) 50 MG tablet Take 1 tablet (50 mg total) by mouth every 6 (six) hours as needed. 60 tablet 0  . LYRICA 200 MG capsule TAKE ONE CAPSULE TWICE A DAY 60 capsule 0  . sertraline (ZOLOFT) 50 MG tablet Take 1 tablet (50 mg total) by mouth daily. (Patient not taking: Reported on 11/17/2016) 30 tablet 3  . spironolactone (ALDACTONE) 25 MG tablet Take 1 tablet (25 mg total) by mouth daily. 90 tablet 3  . torsemide (DEMADEX) 100 MG tablet Take 0.5-1 tablets (50-100 mg total) by mouth as directed. Take one tab (100 mg) in the AM and take one half tab (50 mg)  in the PM 45 tablet 3  . docusate sodium (COLACE) 100 MG capsule Take 1 capsule (100 mg total) by mouth every 12 (twelve) hours. (Patient not taking: Reported on 11/17/2016) 60 capsule 0  . nitrofurantoin, macrocrystal-monohydrate, (MACROBID) 100 MG capsule Take 1 capsule (100 mg total) by mouth 2 (two) times daily. (Patient not taking: Reported on 11/17/2016) 14 capsule 0   No facility-administered medications prior to visit.     ROS See HPI  Objective:  BP 122/60   Pulse 62   Temp 98.5 F (36.9 C)   Ht 5\' 2"  (1.575 m)   Wt 201 lb (91.2 kg)   SpO2 97%   BMI 36.76 kg/m   BP Readings from Last 3 Encounters:  11/17/16 122/60  11/14/16 121/64  11/10/16 99/86    Wt Readings from Last 3 Encounters:  11/17/16 201 lb (91.2 kg)  11/03/16 210 lb (95.3 kg)  10/25/16 209 lb (94.8 kg)    Physical Exam  Constitutional: She is oriented to person, place, and time. No distress.  Cardiovascular: Normal rate.   Pulmonary/Chest: Effort normal and breath sounds normal.  Abdominal: Bowel sounds are normal. She exhibits no distension.  Musculoskeletal: She exhibits no edema.  Neurological: She is alert and oriented to person, place, and time.  Appears somnolent but easily aroused  Vitals reviewed.   Lab Results  Component Value Date   WBC 8.5 11/14/2016   HGB 8.0 (L) 11/14/2016   HCT 24.1 (L) 11/14/2016   PLT 221 11/14/2016   GLUCOSE 124 (H) 11/14/2016   CHOL 232 (H) 02/09/2016   TRIG 362 (H) 02/09/2016   HDL 45 02/09/2016   LDLDIRECT 108.0 07/08/2014   LDLCALC 115 (H) 02/09/2016   ALT 11 (L) 11/14/2016   AST 13 (L) 11/14/2016   NA 136 11/14/2016   K 2.9 (L) 11/14/2016   CL 105 11/14/2016   CREATININE 1.08 (H) 11/14/2016   BUN 25 (H) 11/14/2016   CO2 22 11/14/2016   TSH 1.606 10/09/2015   INR 1.17 05/20/2016   HGBA1C 14.1 Repeated and verified X2. (H) 02/04/2016   MICROALBUR <0.7 07/08/2014    Dg Abd Acute W/chest  Result Date: 11/14/2016 CLINICAL DATA:  Abdominal pain  and constipation.  Nausea. EXAM: DG ABDOMEN ACUTE W/ 1V CHEST COMPARISON:  CT chest, abdomen, and pelvis 11/10/2016 FINDINGS: Right chest port with tip in the mid SVC. Upper normal heart size with unchanged mediastinal contours. No consolidation, pleural fluid or pneumothorax. No free air. No bowel dilatation to suggest obstruction. Moderate colonic stool burden suggesting constipation. Stool in the pelvis likely in the sigmoid, there is air in the rectum. Biliary stent in the right upper quadrant. Pneumobilia on CT is not well seen radiographically. Cholecystectomy clips. IMPRESSION: 1. Moderate colonic stool burden suggesting constipation. No evidence  of rectal impaction. No free air or bowel obstruction. 2. Biliary stent in the right upper quadrant, similar to recent CT. 3.  No acute pulmonary process. Electronically Signed   By: Jeb Levering M.D.   On: 11/14/2016 00:38    Assessment & Plan:   Daishia was seen today for hospitalization follow-up.  Diagnoses and all orders for this visit:  Medication management  Chronic pain syndrome  Other polyneuropathy  Essential hypertension   I have discontinued Ms. Donaghue's docusate sodium, LYRICA, and nitrofurantoin (macrocrystal-monohydrate). I am also having her maintain her aspirin EC, Vitamin D3, carvedilol, hydrALAZINE, insulin glargine, insulin aspart, metFORMIN, spironolactone, potassium chloride SA, torsemide, lactulose, ezetimibe, rOPINIRole, lidocaine-prilocaine, lipase/protease/amylase, ondansetron, prochlorperazine, meclizine, ONETOUCH VERIO, oxyCODONE, traMADol, sertraline, and gabapentin.  No orders of the defined types were placed in this encounter.   Follow-up: Return if symptoms worsen or fail to improve.  Wilfred Lacy, NP

## 2016-11-17 NOTE — Patient Instructions (Addendum)
Stop lyrica. Maintain gabapentin.  Decrease torsemide dose to 50mg  once a day, then call cardiology office for further instructions.  You will be contacted when [facility] form is completed and faxed to [facility].  Take pain medication as prescribed. Do not give medication if increased somnolence, or confusion. Do not take tramadol and oxycodone within 4hrs of each other.  Bring copy of health care POA.

## 2016-11-21 ENCOUNTER — Inpatient Hospital Stay: Payer: PPO | Admitting: Internal Medicine

## 2016-11-21 ENCOUNTER — Telehealth: Payer: Self-pay | Admitting: Internal Medicine

## 2016-11-21 NOTE — Telephone Encounter (Signed)
FL2 refaxed to [facility] att: Abigail Butts fax # [phone].

## 2016-11-21 NOTE — Telephone Encounter (Signed)
States gave Melissa Myers an FL2 to be completed on 9/27.  Would like to know status update. Did inform that Melissa Myers was out of the office today.   Would really like to have completed today b/c states cancer doctor wanted patient in the facility last week due to a major surgery coming up. Please follow back up in regard.

## 2016-11-22 ENCOUNTER — Telehealth: Payer: Self-pay | Admitting: Internal Medicine

## 2016-11-22 DIAGNOSIS — R413 Other amnesia: Secondary | ICD-10-CM | POA: Diagnosis not present

## 2016-11-22 DIAGNOSIS — Z1389 Encounter for screening for other disorder: Secondary | ICD-10-CM | POA: Diagnosis not present

## 2016-11-22 DIAGNOSIS — E1129 Type 2 diabetes mellitus with other diabetic kidney complication: Secondary | ICD-10-CM | POA: Diagnosis not present

## 2016-11-22 DIAGNOSIS — G894 Chronic pain syndrome: Secondary | ICD-10-CM | POA: Diagnosis not present

## 2016-11-22 DIAGNOSIS — N08 Glomerular disorders in diseases classified elsewhere: Secondary | ICD-10-CM | POA: Diagnosis not present

## 2016-11-22 DIAGNOSIS — E1142 Type 2 diabetes mellitus with diabetic polyneuropathy: Secondary | ICD-10-CM | POA: Diagnosis not present

## 2016-11-22 DIAGNOSIS — E440 Moderate protein-calorie malnutrition: Secondary | ICD-10-CM | POA: Diagnosis not present

## 2016-11-22 DIAGNOSIS — J439 Emphysema, unspecified: Secondary | ICD-10-CM | POA: Diagnosis not present

## 2016-11-22 DIAGNOSIS — C250 Malignant neoplasm of head of pancreas: Secondary | ICD-10-CM | POA: Diagnosis not present

## 2016-11-22 DIAGNOSIS — I5032 Chronic diastolic (congestive) heart failure: Secondary | ICD-10-CM | POA: Diagnosis not present

## 2016-11-22 DIAGNOSIS — Z6841 Body Mass Index (BMI) 40.0 and over, adult: Secondary | ICD-10-CM | POA: Diagnosis not present
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
## 2016-11-22 NOTE — Telephone Encounter (Signed)
chystal from [facility]  She needs to speak to nurse about pt getting skilled nursing   [phone]

## 2016-11-23 ENCOUNTER — Encounter (HOSPITAL_COMMUNITY): Payer: PPO | Admitting: Internal Medicine

## 2016-11-23 NOTE — Telephone Encounter (Signed)
Recommend visit to evaluate medications. We cannot make [facility] accept patient. We can only fill out fl-2 to give our recommendations. They could try another facility.

## 2016-11-24 ENCOUNTER — Telehealth: Payer: Self-pay

## 2016-11-24 ENCOUNTER — Encounter (HOSPITAL_COMMUNITY): Payer: Self-pay | Admitting: Cardiology

## 2016-11-24 ENCOUNTER — Ambulatory Visit (HOSPITAL_COMMUNITY)
Admission: RE | Admit: 2016-11-24 | Discharge: 2016-11-24 | Disposition: A | Discharge status: Home / Self Care | Payer: PPO | Source: Ambulatory Visit | Attending: Internal Medicine | Admitting: Internal Medicine

## 2016-11-24 VITALS — BP 120/58 | HR 60 | Wt 193.5 lb

## 2016-11-24 DIAGNOSIS — E1142 Type 2 diabetes mellitus with diabetic polyneuropathy: Secondary | ICD-10-CM | POA: Insufficient documentation

## 2016-11-24 DIAGNOSIS — R0789 Other chest pain: Secondary | ICD-10-CM | POA: Insufficient documentation

## 2016-11-24 DIAGNOSIS — C250 Malignant neoplasm of head of pancreas: Secondary | ICD-10-CM

## 2016-11-24 DIAGNOSIS — I110 Hypertensive heart disease with heart failure: Secondary | ICD-10-CM | POA: Diagnosis not present

## 2016-11-24 DIAGNOSIS — R079 Chest pain, unspecified: Secondary | ICD-10-CM

## 2016-11-24 DIAGNOSIS — E781 Pure hyperglyceridemia: Secondary | ICD-10-CM | POA: Diagnosis not present

## 2016-11-24 DIAGNOSIS — G4733 Obstructive sleep apnea (adult) (pediatric): Secondary | ICD-10-CM | POA: Diagnosis not present

## 2016-11-24 DIAGNOSIS — C259 Malignant neoplasm of pancreas, unspecified: Secondary | ICD-10-CM | POA: Insufficient documentation

## 2016-11-24 DIAGNOSIS — Z794 Long term (current) use of insulin: Secondary | ICD-10-CM | POA: Diagnosis not present

## 2016-11-24 DIAGNOSIS — E785 Hyperlipidemia, unspecified: Secondary | ICD-10-CM | POA: Insufficient documentation

## 2016-11-24 DIAGNOSIS — R011 Cardiac murmur, unspecified: Secondary | ICD-10-CM | POA: Diagnosis not present

## 2016-11-24 DIAGNOSIS — R0602 Shortness of breath: Secondary | ICD-10-CM | POA: Diagnosis not present

## 2016-11-24 DIAGNOSIS — I5032 Chronic diastolic (congestive) heart failure: Secondary | ICD-10-CM | POA: Insufficient documentation

## 2016-11-24 DIAGNOSIS — G2581 Restless legs syndrome: Secondary | ICD-10-CM | POA: Insufficient documentation

## 2016-11-24 DIAGNOSIS — Z7982 Long term (current) use of aspirin: Secondary | ICD-10-CM | POA: Insufficient documentation

## 2016-11-24 DIAGNOSIS — I4510 Unspecified right bundle-branch block: Secondary | ICD-10-CM | POA: Insufficient documentation

## 2016-11-24 DIAGNOSIS — E669 Obesity, unspecified: Secondary | ICD-10-CM | POA: Diagnosis not present

## 2016-11-24 DIAGNOSIS — R0600 Dyspnea, unspecified: Secondary | ICD-10-CM | POA: Diagnosis not present

## 2016-11-24 LAB — BASIC METABOLIC PANEL
Anion gap: 13 (ref 5–15)
BUN: 32 mg/dL — AB (ref 6–20)
CO2: 26 mmol/L (ref 22–32)
CREATININE: 1.29 mg/dL — AB (ref 0.44–1.00)
Calcium: 8.8 mg/dL — ABNORMAL LOW (ref 8.9–10.3)
Chloride: 93 mmol/L — ABNORMAL LOW (ref 101–111)
GFR calc Af Amer: 47 mL/min — ABNORMAL LOW (ref >60)
GFR, EST NON AFRICAN AMERICAN: 41 mL/min — AB (ref >60)
Glucose, Bld: 179 mg/dL — ABNORMAL HIGH (ref 65–99)
Potassium: 3.9 mmol/L (ref 3.5–5.1)
SODIUM: 132 mmol/L — AB (ref 135–145)

## 2016-11-24 LAB — BRAIN NATRIURETIC PEPTIDE: B NATRIURETIC PEPTIDE 5: 58.1 pg/mL (ref 0.0–100.0)

## 2016-11-24 NOTE — Addendum Note (Signed)
Encounter addended by: Effie Berkshire, RN on: 11/24/2016  4:51 PM<BR>    Actions taken: Visit diagnoses modified, Order list changed, Diagnosis association updated

## 2016-11-24 NOTE — Telephone Encounter (Signed)
I have talked with crystal/THN---she has been in touch with [facility] and will call back if she needs any further information

## 2016-11-24 NOTE — Telephone Encounter (Signed)
I have left daughter a vm to call back in regard.

## 2016-11-24 NOTE — Telephone Encounter (Signed)
I have recommended visit to discuss medications. We cannot get social worker to house without home health in the home. I am happy to redo Fl-2 if they desire.

## 2016-11-24 NOTE — Telephone Encounter (Signed)
Son to bring FL2 to the appt.
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
## 2016-11-24 NOTE — Telephone Encounter (Signed)
I was able to get in touch with son.  We have scheduled patient for a med follow up on 10/5 w/ Dr. Sharlet Salina.  Family would also like FL2 rewritten if possible.

## 2016-11-24 NOTE — Telephone Encounter (Signed)
We will need another fl-2 form as we do not keep them here.
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
## 2016-11-24 NOTE — Progress Notes (Signed)
Patient ID: Melissa Myers, female   DOB: 09/01/1945, 71 y.o.   MRN: [id number]   PCP: Dr. Sharlet Salina Endocrinologist: Dr Loanne Drilling Cardiology: Dr Aundra Dubin.   71 y.o. with history of HTN, DM, hyperlipidemia, OHS/OSA, and chronic dyspnea/diastolic CHF returns for followup of CHF.  Patient had an echo in 04/2009 showing moderate LVH and preserved LV systolic function. However, there was a very large LV mid-cavity gradient with valsalva.  Patient developed quite significant exertional dyspnea to the point where she was short of breath walking around her house. She had a pulmonary evaluation with Dr. Elsworth Soho but no primary lung problems were identified. Patient had an ETT-myoview in 05/2009 which was negative for ischemia or infarction. Right heart cath in 05/2009 showed mildly elevated right heart filling pressures but normal PA pressure and normal PCWP. She was started her on a beta blocker (Coreg) to try to lower her LV mid-cavity gradient (that likely occurs with exertion) and to better control BP.  V/Q scan was negative for PE.  PFTs from 06/2011 showed a restrictive defect. Last echo in 11/2013 showed EF 65% with normal RV size and systolic function.      Admitted 12/05/13 with volume overload. Diuresed with IV lasix and transitioned to torsemide 80 mg twice a day + metolazone. Overall she diuresed 21 pounds. Discharge weight was 263 pounds.   She was admitted in 9/16 with Strep agalactiae sepsis likely from cellulitis.    Admitted 12/1 though 01/26/15 with marked volume overload and cellulitis. Diuresed with Lasix drip and placed on antibiotics. Overall diuresed 20 pounds.   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  Most of leg pain is likely due to neuropathy.    She was diagnosed with pancreatic cancer in 2018.  Being followed at [facility] currently, definite decision +/- surgery has not yet been made.  Poor functional status.  Memory is  worse.  She has lost considerable weight, down 29 lbs since last appointment.  She is now off torsemide.  It was recommended that she go to a rehab facility for PT to see if she will be physically able to have surgery.    She is not very active, uses a walker.  No dyspnea walking in her house.  No orthonpea/PND.  No lightheadedness.  She has been having left lateral chest discomfort on and off for the last 3 days.  It is not pleuritic or exertional.  She has had similar pain multiple times over the last 2-3 years, but she had not had much recently.    Labs (10/13): K 4.1, creatinine 0.85 Labs (4/14): K 3.7, creatinine 1.2, BUN 43, BNP 23 Labs (5/14): K 4.1, creatinine 1.0 Labs (7/14): K 4.6 Creatinine 1.0 BNP 39.0  Labs (8/14): K 3.5, creatinine 0.91, BNP 71 Labs (11/14): K 3.7, creatinine 0.9 Labs (12/14): K 4.1, creatinine 0.8, BNP 52 Labs (3/15): K 4, creatinine 0.9 Labs (4/15): K 3.7, creatinine 0.9 Labs (5/15): K 3.8, creatinine 0.9, BNP 29 Labs (6/15): K 3.4, BUN 105, creatinine 0.9 =>1.7 Labs (6/17/5) K 4.2, BUN 41, Creatinine 0.80 Labs (08/19/13) K 3.2, BUN 71, creatinine 1.5 Labs 09/05/13 K 4.1 Creatinine 1.1 Labs 11/07/13 K 3.6 Creatinine 0.91 Labs 12/11/13 K 4.5 Creatine 1.05 Labs (12/15): K 3.8, creatinine 1.03 Labs (03/17/2014): K 4.1 Creatinine 0.99  Labs (4/16): K 3.7, creatinine 1.24 Labs (5/16): LDL 108, TGs 699 Labs (07/24/2014) K 3.5 Creatinine 1.17  Labs (07/31/2014) K 3.3 Creatinine  1.13 Labs (9/16): K 3.5, creatinine 0.93 Labs (01/26/2015) K 3.6 Creatinine 1.36  Labs 04/22/15 K 3.8, Creatinine 1.66 Labs (4/17): K 4.6, creatinine 1.74, HCT 37.9 Labs (9/17): K 2.8, creatinine 1.67, glucose 505, BNP 20, hgb 12.4 Labs (11/19/2015) : K 3.2 Creatinine 2.25  Labs (11/27/2015): K 4.4 Creatinine 1.26  Labs (11/17): BNP 63 Labs (12/17): K 3.9, creatinine 1.49, hgbA1c 14.1 Labs (9/18): K 2.9, creatinine 1.08, hgb 8  Allergies (verified):  1) ! Sulfa  2) Morphine   Past Medical  History:  1. Diabetes mellitus, type II  2. Hyperlipidemia: She has been unable to tolerate statins (has been on Vytorin, lovastatin, and Crestor) due to what sounds like rhabdo: developed muscle weakness and was told she had "muscle damage" with each of these statins. She has been told not to take statins anymore.  Has hypertriglyceridemia.  3. Hypertension 4. Obesity  5. Restless Leg Syndrome  6. OA  7. Diabetic peripheral neuropathy  8. OHS/OSA: Uses supplemental oxygen, does not tolerate CPAP.   9. Chronic nausea/diarrhea: ? IBS  10. Diastolic CHF: Echo (1/85) with normal LV size, moderate LVH, EF 65-70%, LV mid-cavity gradient reaching 63 mmHg with valsalva but minimal at rest, grade I diastolic dysfunction, cannot estimate PA systolic pressure (no TR doppler signal), RV normal. RHC (4/11): Mean RA 11, RV 37/13, PA 33/15, mean PCWP 12, CI 3.3. Echo in 11/12 showed mild LVH, EF 65-70%, no LV mid-cavity gradient was mentioned.  Echo in 4/14 showed EF 65-70%, mild LVH, grade I diastolic dysfunction, PA systolic pressure 35 mmHg, mild LV mid-cavity gradient.  Echo (5/15) with EF 60-65%, mild TR, normal RV size and systolic function. Echo (10/15) with EF 65%, moderate LVH, normal RV.  - ECHO 11/30/15 LVEF 65-70%, Grade 1 DD.  11. ETT-myoview (4/11): 5'30", stopped due to fatigue, normal EF, no evidence for scar or ischemia. 12. V/Q scan 11/12: negative for PE.  Lower extremity venous doppler US (3/14): negative for DVT.  13. PFTs (5/13): FVC 50%, FEV1 62%, ratio 106%, DLCO 69%, TLC 69%.  14. Pancreatic adenocarcinoma: Found in 2018.   Family History:  Family History of Alcoholism/Addiction (parent)  Family History Diabetes 1st degree relative (grandparent)  Family History High cholesterol (parent, grandparent)  Family History Hypertension (parent, grandparent)  Family History Lung cancer (grandparent)  Stomach cancer (grandmother)  Celiac Sprue daughter  Mother with MI at 23, father with MI  at 19, uncle with MI at 43, brother with stents in his 33s, multiple aunts with MIs and CVAs   Social History:  Never Smoked  no alcohol  married, lives with spouse and her mother  retired Network engineer Alcohol Use - no  Illicit Drug Use - no  Review of Systems  All systems reviewed and negative except as per HPI.   ROS: All systems reviewed and negative except as per HPI.   Current Outpatient Prescriptions  Medication Sig Dispense Refill  . aspirin EC 81 MG tablet Take 1 tablet (81 mg total) by mouth daily.    . carvedilol (COREG) 25 MG tablet TAKE 1 TABLET BY MOUTH TWICE DAILY WITH FOOD 60 tablet 5  . Cholecalciferol (VITAMIN D3) 2000 units capsule Take 1 capsule (2,000 Units total) by mouth daily. 90 capsule 3  . ezetimibe (ZETIA) 10 MG tablet Take 1 tablet (10 mg total) by mouth daily. 90 tablet 1  . gabapentin (NEURONTIN) 300 MG capsule TAKE TWO CAPSULES FOUR TIMES DAILY 240 capsule 1  . hydrALAZINE (APRESOLINE) 25 MG tablet  TAKE 1 TABLET (25 MG TOTAL) BY MOUTH 3 (THREE) TIMES DAILY. 90 tablet 1  . insulin aspart (NOVOLOG) 100 UNIT/ML injection Inject 0-20 Units into the skin 3 (three) times daily with meals. . CBG 70 - 120: 0 units CBG 121 - 150: 3 units CBG 151 - 200: 4 units CBG 201 - 250: 7 units CBG 251 - 300: 11 units CBG 301 - 350: 15 units CBG 351 - 400: 20 units    . insulin glargine (LANTUS) 100 UNIT/ML injection Inject 0.3 mLs (30 Units total) into the skin daily.    [name] lactulose (CHRONULAC) 10 GM/15ML solution Take 10 g by mouth daily as needed for mild constipation.    . lidocaine-prilocaine (EMLA) cream Apply to port site prior to being accessed    . lipase/protease/amylase (CREON) 36000 UNITS CPEP capsule 2 po prior to snacks; 4 po prior to meals    . meclizine (ANTIVERT) 25 MG tablet Take by mouth.    . metFORMIN (GLUCOPHAGE) 500 MG tablet Take 500 mg by mouth daily with breakfast.    . ondansetron (ZOFRAN) 8 MG tablet Take by mouth.    Glory Rosebush VERIO test strip  USE AS INSTRUCTED UP TO 4 TIMES DAILY 200 each 11  . oxyCODONE (OXY IR/ROXICODONE) 5 MG immediate release tablet Take 1 tablet (5 mg total) by mouth 2 (two) times daily as needed for severe pain. 60 tablet 0  . prochlorperazine (COMPAZINE) 10 MG tablet Take by mouth.    [name] rOPINIRole (REQUIP XL) 4 MG 24 hr tablet TAKE ONE TABLET AT BEDTIME 30 tablet 5  . sertraline (ZOLOFT) 50 MG tablet Take 1 tablet (50 mg total) by mouth daily. 30 tablet 3  . spironolactone (ALDACTONE) 25 MG tablet Take 1 tablet (25 mg total) by mouth daily. 90 tablet 3  . traMADol (ULTRAM) 50 MG tablet Take 1 tablet (50 mg total) by mouth every 6 (six) hours as needed. 60 tablet 0   No current facility-administered medications for this encounter.    Vitals:   11/24/16 1201  BP: (!) 120/58  Pulse: 60  SpO2: 99%  Weight: 193 lb 8 oz (87.8 kg)   Wt Readings from Last 3 Encounters:  11/24/16 193 lb 8 oz (87.8 kg)  11/17/16 201 lb (91.2 kg)  11/03/16 210 lb (95.3 kg)    General: NAD Neck: No JVD, no thyromegaly or thyroid nodule.  Lungs: Clear to auscultation bilaterally with normal respiratory effort. CV: Nondisplaced PMI.  Heart regular S1/S2, no S3/S4, 1/6 SEM RUSB.  No peripheral edema.  No carotid bruit.  Normal pedal pulses.  Abdomen: Soft, nontender, no hepatosplenomegaly, no distention.  Skin: Intact without lesions or rashes.  Neurologic: Alert and oriented x 3.  Psych: Normal affect. Extremities: No clubbing or cyanosis.  HEENT: Normal.   Assessment/Plan: 1. Chronic diastolic CHF:  Primarily limited by fatigue/weakness now, not dyspnea.  Weight down 29 lbs since last appointment in setting of pancreatic cancer.  She has been off torsemide for 2 wks and does not appear volume overloaded.   - Check BMET, may need to restart K replacement.  - For now, follow off torsemide.  Check daily weight.  If trends up, will need to restart torsemide.  2. OHS:  Followed by Dr Elsworth Soho, has previously stated no role for CPAP.   3. HTN: Controlled.   4. PAD: Bilateral iliac disease on aortoiliac dopplers in 7/17.   - No change in current plan for medical management with Dr. Fletcher Anon.  -  Continue Zetia, cannot take statins.  5. Murmur: Has had vigorous LV contraction with LV mid-cavity gradient in the past (is on Coreg).  No change.  6. Pancreatic cancer: Decision not yet made definitively regarding surgery.  May need to spend time in rehab facility to improve stamina.  7. Chest pain: Atypical chest pain for the last 3 days, has had similar pain in the past but not recently.  Given presence of pancreatic cancer, I am most concerned for PE.  Creatinine has been normal, will order CT chest to rule out PE.   Loralie Champagne, MD  11/24/2016

## 2016-11-24 NOTE — Patient Instructions (Signed)
Routine lab work today. Will notify you of abnormal results, otherwise no news is good news!  Will schedule you for CT chest at [facility] will be called for this appointment.  Follow up 3 weeks.  ___________________________________________________________ Marin Roberts Code: 8000  Take all medication as prescribed the day of your appointment. Bring all medications with you to your appointment.  Do the following things EVERYDAY: 1) Weigh yourself in the morning before breakfast. Write it down and keep it in a log. 2) Take your medicines as prescribed 3) Eat low salt foods-Limit salt (sodium) to 2000 mg per day.  4) Stay as active as you can everyday 5) Limit all fluids for the day to less than 2 liters

## 2016-11-25 ENCOUNTER — Encounter: Payer: Self-pay | Admitting: Internal Medicine

## 2016-11-25 ENCOUNTER — Ambulatory Visit (INDEPENDENT_AMBULATORY_CARE_PROVIDER_SITE_OTHER): Payer: PPO | Admitting: Internal Medicine

## 2016-11-25 DIAGNOSIS — Z7189 Other specified counseling: Secondary | ICD-10-CM

## 2016-11-25 DIAGNOSIS — C250 Malignant neoplasm of head of pancreas: Secondary | ICD-10-CM

## 2016-11-25 NOTE — Progress Notes (Signed)
   Subjective:    Patient ID: Melissa Myers, female    DOB: 11/30/1945, 71 y.o.   MRN: [id number]  HPI The patient is a 71 YO female coming in with her son for some advice about possible upcoming surgery for her pancreatic cancer. She has undergone chemotherapy and the tumors are about the same size. She does have metastatic disease. She is not in good enough condition to currently undergo surgery and they would like her to go for rehab so she can be a candidate. She is in pain now and is always in pain from her back (no cancer in her spine). She is using oxycodone in the morning and tramadol prn (but rare usage). She has been taking ibuprofen TID 4 pills per the advice of another family member. She denies falls but is not able to do much of anything at home. No pain from the cancer.   Review of Systems  Constitutional: Positive for activity change, appetite change and unexpected weight change. Negative for chills, fatigue and fever.  HENT: Negative.   Eyes: Negative.   Respiratory: Positive for shortness of breath. Negative for cough and chest tightness.   Cardiovascular: Negative for chest pain, palpitations and leg swelling.  Gastrointestinal: Negative for abdominal distention, abdominal pain, constipation, diarrhea, nausea and vomiting.  Musculoskeletal: Positive for arthralgias, back pain, gait problem and myalgias.  Skin: Negative.   Neurological: Positive for weakness and numbness.  Psychiatric/Behavioral: Positive for confusion and decreased concentration.      Objective:   Physical Exam  Constitutional: She is oriented to person, place, and time. She appears well-developed and well-nourished.  obese  HENT:  Head: Normocephalic and atraumatic.  Eyes: EOM are normal.  Neck: Normal range of motion.  Cardiovascular: Normal rate and regular rhythm.   Pulmonary/Chest: Effort normal and breath sounds normal. No respiratory distress. She has no wheezes. She has no rales.    Abdominal: Soft. Bowel sounds are normal. She exhibits no distension. There is no tenderness. There is no rebound.  Musculoskeletal: She exhibits no edema.  Neurological: She is alert and oriented to person, place, and time. Coordination abnormal.  Wheelchair, only able to stand and take a few steps  Skin: Skin is warm and dry.  Psychiatric:  Some problems with short term memory during the visit but she is able to understand the risks and possible benefits of surgery and going to rehab for therapy   Vitals:   11/25/16 1333  BP: 120/60  Pulse: 61  Temp: 98.2 F (36.8 C)  TempSrc: Oral  SpO2: 99%  Weight: 192 lb (87.1 kg)  Height: 5\' 2"  (1.575 m)      Assessment & Plan:  Visit time 25 minutes: greater than 50% of that time was spent in coordination of care and counseling with the patient and her son. Counseled about nature of pancreatic cancer and possible poor outcome even with surgery. Extended goals of care discussion.

## 2016-11-25 NOTE — Assessment & Plan Note (Signed)
She is currently struggling with the decision about surgery or not. Previously she has expressed that she would want surgery but she does not endorse that today. She states today that her goals of care would be pain control and QOL over length of life. We discussed that even with the surgery she could still have recurrent pancreatic cancer and that this will take her life without treatment but could also take her life with surgery.

## 2016-11-25 NOTE — Patient Instructions (Signed)
Think about whether you want to have the surgery or not.   The surgery has a long recovery time and can be hard to go through.   The surgery does not guarantee that the cancer will be gone and it could come back again.   If you do not have the surgery the pancreas cancer will spread and eventually will take your life.   Do not take ibuprofen, aleve, advil, motrin.   It is okay to take tylenol 1000 mg (2 pills) twice a day. Do not take more than this.

## 2016-11-25 NOTE — Assessment & Plan Note (Signed)
She is currently not wanting to undergo surgery. Her daughter who has been concerned about her and calling the office did not come to this visit. She is currently able to make her own health decisions. She agrees to go for rehab to see what condition she is in before making the decision and have counseled her to also talk with family. Encouraged to bring family to the visits if she wants help discussing treatment options with them present.

## 2016-11-30 ENCOUNTER — Telehealth: Payer: Self-pay | Admitting: Internal Medicine

## 2016-11-30 LAB — URINALYSIS W MICROSCOPIC (NOT AT ARMC)
BILIRUBIN: NEGATIVE
Bacteria: NONE SEEN
GLUCOSE: NEGATIVE
Hyaline Cast: NONE SEEN
KETONE: NEGATIVE
NITRITE: NEGATIVE
OCCULT BLOOD: NEGATIVE
PH: 5.5
PROTEIN: NEGATIVE
SPECIFIC GRAVITY: 1.014

## 2016-11-30 NOTE — Telephone Encounter (Signed)
Called crystal back she did not answer. I am leaving this afternoon if she calls back this is what I know, Dr. Sharlet Salina has given her suggestion for patient on her FL2 that was filled out at their office visit. Dr. Sharlet Salina states that since patient has ben denied at [facility] there is nothing she is able to do to change their mind and that if patient finds another place that is willing to take patient in that she would consider a peer to peer review.

## 2016-11-30 NOTE — Telephone Encounter (Signed)
Patient's daughter called and said that we need to speak with Melissa Myers. They are having issues getting her into the nursing facilities. The need more information on the FL2. She states they have tired to contact us and be unable so they dropped the case. If you could follow up with Melissa Myers and get the information they need everything can be back on track.   [facility]: [phone]  Once you have done this Melissa Myers is asking for a call back as well to know we are working on this.

## 2016-12-02 ENCOUNTER — Encounter: Payer: Self-pay | Admitting: Internal Medicine

## 2016-12-06 NOTE — Telephone Encounter (Signed)
Called crystal left another message about the FL2 and needing more information to get things processed.

## 2016-12-07 ENCOUNTER — Other Ambulatory Visit (HOSPITAL_COMMUNITY): Payer: Self-pay | Admitting: *Deleted

## 2016-12-07 ENCOUNTER — Telehealth: Payer: Self-pay | Admitting: Emergency Medicine

## 2016-12-07 MED ORDER — HYDRALAZINE HCL 25 MG PO TABS: ORAL_TABLET | ORAL | 3 refills | Status: DC

## 2016-12-07 NOTE — Telephone Encounter (Signed)
Form refaxed per diane's request

## 2016-12-07 NOTE — Telephone Encounter (Signed)
tallked with Melissa Myers----she is needing fl2 form faxed again---she will be calling back with the correct fax number---please route that number to tamara---fl2 will be refaxed ---dr Sharlet Salina will not be filling out another form---the original form is still current with no other changes occuring

## 2016-12-07 NOTE — Telephone Encounter (Signed)
Called back with fax number- [phone]. She asked for this to be expedited. Thanks.

## 2016-12-07 NOTE — Telephone Encounter (Signed)
Pt is going in to [facility] facility either Friday 10/19 or sat 10/20. They are needing an FL2 filled out for this. She also wanted to let you know the PA phone number is [phone]. Please expedite thanks.

## 2016-12-08 ENCOUNTER — Ambulatory Visit (HOSPITAL_COMMUNITY)
Admission: RE | Admit: 2016-12-08 | Discharge: 2016-12-08 | Disposition: A | Discharge status: Home / Self Care | Payer: PPO | Source: Ambulatory Visit | Attending: Cardiology | Admitting: Cardiology

## 2016-12-08 DIAGNOSIS — R079 Chest pain, unspecified: Secondary | ICD-10-CM | POA: Diagnosis not present

## 2016-12-08 DIAGNOSIS — I281 Aneurysm of pulmonary artery: Secondary | ICD-10-CM | POA: Diagnosis not present

## 2016-12-08 DIAGNOSIS — I2510 Atherosclerotic heart disease of native coronary artery without angina pectoris: Secondary | ICD-10-CM | POA: Diagnosis not present

## 2016-12-08 DIAGNOSIS — I700 Atherosclerosis of aorta: Secondary | ICD-10-CM | POA: Insufficient documentation

## 2016-12-08 MED ORDER — HEPARIN SOD (PORK) LOCK FLUSH 100 UNIT/ML IV SOLN
INTRAVENOUS | Status: AC
  Filled 2016-12-08: qty 5

## 2016-12-08 MED ORDER — IOPAMIDOL (ISOVUE-370) INJECTION 76%
INTRAVENOUS | Status: AC
  Administered 2016-12-08: 100 mL
  Filled 2016-12-08: qty 100

## 2016-12-09 NOTE — Telephone Encounter (Signed)
Diane called back asking if we can complete a PA for this. Per Virginia Crews must be the one to complete the PA. Diane said that she thought that would be the case and would continue to try to get in touch with them to get this completed.

## 2016-12-09 NOTE — Telephone Encounter (Signed)
See phone note 12/07/16

## 2016-12-15 ENCOUNTER — Encounter (HOSPITAL_COMMUNITY): Payer: PPO

## 2016-12-15 ENCOUNTER — Telehealth: Payer: Self-pay

## 2016-12-15 NOTE — Telephone Encounter (Signed)
LOV notes printed and faxed to health insurance to help in regards to patient doing a PA to get in a nursing home

## 2016-12-16 ENCOUNTER — Ambulatory Visit (HOSPITAL_COMMUNITY)
Admission: RE | Admit: 2016-12-16 | Discharge: 2016-12-16 | Disposition: A | Discharge status: Home / Self Care | Payer: PPO | Source: Ambulatory Visit | Attending: Cardiology | Admitting: Cardiology

## 2016-12-16 VITALS — BP 136/58 | HR 60 | Wt 190.8 lb

## 2016-12-16 DIAGNOSIS — E1151 Type 2 diabetes mellitus with diabetic peripheral angiopathy without gangrene: Secondary | ICD-10-CM | POA: Insufficient documentation

## 2016-12-16 DIAGNOSIS — C250 Malignant neoplasm of head of pancreas: Secondary | ICD-10-CM | POA: Diagnosis not present

## 2016-12-16 DIAGNOSIS — R011 Cardiac murmur, unspecified: Secondary | ICD-10-CM | POA: Diagnosis not present

## 2016-12-16 DIAGNOSIS — Z7982 Long term (current) use of aspirin: Secondary | ICD-10-CM | POA: Diagnosis not present

## 2016-12-16 DIAGNOSIS — G2581 Restless legs syndrome: Secondary | ICD-10-CM | POA: Insufficient documentation

## 2016-12-16 DIAGNOSIS — I5032 Chronic diastolic (congestive) heart failure: Secondary | ICD-10-CM | POA: Insufficient documentation

## 2016-12-16 DIAGNOSIS — C259 Malignant neoplasm of pancreas, unspecified: Secondary | ICD-10-CM | POA: Insufficient documentation

## 2016-12-16 DIAGNOSIS — I83029 Varicose veins of left lower extremity with ulcer of unspecified site: Secondary | ICD-10-CM | POA: Diagnosis not present

## 2016-12-16 DIAGNOSIS — E662 Morbid (severe) obesity with alveolar hypoventilation: Secondary | ICD-10-CM | POA: Insufficient documentation

## 2016-12-16 DIAGNOSIS — I110 Hypertensive heart disease with heart failure: Secondary | ICD-10-CM | POA: Insufficient documentation

## 2016-12-16 DIAGNOSIS — E1142 Type 2 diabetes mellitus with diabetic polyneuropathy: Secondary | ICD-10-CM | POA: Diagnosis not present

## 2016-12-16 DIAGNOSIS — N183 Chronic kidney disease, stage 3 unspecified: Secondary | ICD-10-CM

## 2016-12-16 DIAGNOSIS — E785 Hyperlipidemia, unspecified: Secondary | ICD-10-CM | POA: Diagnosis not present

## 2016-12-16 DIAGNOSIS — Z794 Long term (current) use of insulin: Secondary | ICD-10-CM | POA: Insufficient documentation

## 2016-12-16 DIAGNOSIS — L97929 Non-pressure chronic ulcer of unspecified part of left lower leg with unspecified severity: Secondary | ICD-10-CM

## 2016-12-16 DIAGNOSIS — E781 Pure hyperglyceridemia: Secondary | ICD-10-CM | POA: Insufficient documentation

## 2016-12-16 DIAGNOSIS — L97919 Non-pressure chronic ulcer of unspecified part of right lower leg with unspecified severity: Secondary | ICD-10-CM | POA: Diagnosis not present

## 2016-12-16 DIAGNOSIS — I10 Essential (primary) hypertension: Secondary | ICD-10-CM

## 2016-12-16 DIAGNOSIS — R079 Chest pain, unspecified: Secondary | ICD-10-CM | POA: Diagnosis not present

## 2016-12-16 DIAGNOSIS — I83019 Varicose veins of right lower extremity with ulcer of unspecified site: Secondary | ICD-10-CM

## 2016-12-16 NOTE — Patient Instructions (Signed)
Follow up 3 months with Dr. Aundra Dubin.  Take all medication as prescribed the day of your appointment. Bring all medications with you to your appointment.  Do the following things EVERYDAY: 1) Weigh yourself in the morning before breakfast. Write it down and keep it in a log. 2) Take your medicines as prescribed 3) Eat low salt foods-Limit salt (sodium) to 2000 mg per day.  4) Stay as active as you can everyday 5) Limit all fluids for the day to less than 2 liters

## 2016-12-16 NOTE — Progress Notes (Signed)
Patient ID: Melissa Myers, female   DOB: 1945-03-06, 71 y.o.   MRN: [id number]   PCP: Dr. Sharlet Salina Endocrinologist: Dr Loanne Drilling Cardiology: Dr Aundra Dubin.   71 y.o. with history of HTN, DM, hyperlipidemia, OHS/OSA, and chronic dyspnea/diastolic CHF returns for followup of CHF.  Patient had an echo in 04/2009 showing moderate LVH and preserved LV systolic function. However, there was a very large LV mid-cavity gradient with valsalva.  Patient developed quite significant exertional dyspnea to the point where she was short of breath walking around her house. She had a pulmonary evaluation with Dr. Elsworth Soho but no primary lung problems were identified. Patient had an ETT-myoview in 05/2009 which was negative for ischemia or infarction. Right heart cath in 05/2009 showed mildly elevated right heart filling pressures but normal PA pressure and normal PCWP. She was started her on a beta blocker (Coreg) to try to lower her LV mid-cavity gradient (that likely occurs with exertion) and to better control BP.  V/Q scan was negative for PE.  PFTs from 06/2011 showed a restrictive defect. Last echo in 11/2013 showed EF 65% with normal RV size and systolic function.      Admitted 12/05/13 with volume overload. Diuresed with IV lasix and transitioned to torsemide 80 mg twice a day + metolazone. Overall she diuresed 21 pounds. Discharge weight was 263 pounds.   She was admitted in 9/16 with Strep agalactiae sepsis likely from cellulitis.    Admitted 12/1 though 01/26/15 with marked volume overload and cellulitis. Diuresed with Lasix drip and placed on antibiotics. Overall diuresed 20 pounds.   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  Most of leg pain is likely due to neuropathy.    She was diagnosed with pancreatic cancer in 2018.  Being followed at [facility] currently, definite decision +/- surgery has not yet been made.  Poor functional status.  Memory is  worse.  She has lost considerable weight, down 29 lbs since last appointment.  She is now off torsemide.  It was recommended that she go to a rehab facility for PT to see if she will be physically able to have surgery.    Returns today for HF follow up. She is feeling well, has more energy lately. She has not taken any torsemide as instructed by Dr. Aundra Dubin. Her weight is stable, down 3 pounds from last visit. She is limiting her fluid intake, eating low sodium foods.   Labs (10/13): K 4.1, creatinine 0.85 Labs (4/14): K 3.7, creatinine 1.2, BUN 43, BNP 23 Labs (5/14): K 4.1, creatinine 1.0 Labs (7/14): K 4.6 Creatinine 1.0 BNP 39.0  Labs (8/14): K 3.5, creatinine 0.91, BNP 71 Labs (11/14): K 3.7, creatinine 0.9 Labs (12/14): K 4.1, creatinine 0.8, BNP 52 Labs (3/15): K 4, creatinine 0.9 Labs (4/15): K 3.7, creatinine 0.9 Labs (5/15): K 3.8, creatinine 0.9, BNP 29 Labs (6/15): K 3.4, BUN 105, creatinine 0.9 =>1.7 Labs (6/17/5) K 4.2, BUN 41, Creatinine 0.80 Labs (08/19/13) K 3.2, BUN 71, creatinine 1.5 Labs 09/05/13 K 4.1 Creatinine 1.1 Labs 11/07/13 K 3.6 Creatinine 0.91 Labs 12/11/13 K 4.5 Creatine 1.05 Labs (12/15): K 3.8, creatinine 1.03 Labs (03/17/2014): K 4.1 Creatinine 0.99  Labs (4/16): K 3.7, creatinine 1.24 Labs (5/16): LDL 108, TGs 699 Labs (07/24/2014) K 3.5 Creatinine 1.17  Labs (07/31/2014) K 3.3 Creatinine 1.13 Labs (9/16): K 3.5, creatinine 0.93 Labs (01/26/2015) K 3.6 Creatinine 1.36  Labs 04/22/15 K 3.8, Creatinine 1.66 Labs (  4/17): K 4.6, creatinine 1.74, HCT 37.9 Labs (9/17): K 2.8, creatinine 1.67, glucose 505, BNP 20, hgb 12.4 Labs (11/19/2015) : K 3.2 Creatinine 2.25  Labs (11/27/2015): K 4.4 Creatinine 1.26  Labs (11/17): BNP 63 Labs (12/17): K 3.9, creatinine 1.49, hgbA1c 14.1 Labs (9/18): K 2.9, creatinine 1.08, hgb 8  Allergies (verified):  1) ! Sulfa  2) Morphine   Past Medical History:  1. Diabetes mellitus, type II  2. Hyperlipidemia: She has been unable to  tolerate statins (has been on Vytorin, lovastatin, and Crestor) due to what sounds like rhabdo: developed muscle weakness and was told she had "muscle damage" with each of these statins. She has been told not to take statins anymore.  Has hypertriglyceridemia.  3. Hypertension 4. Obesity  5. Restless Leg Syndrome  6. OA  7. Diabetic peripheral neuropathy  8. OHS/OSA: Uses supplemental oxygen, does not tolerate CPAP.   9. Chronic nausea/diarrhea: ? IBS  10. Diastolic CHF: Echo (1/02) with normal LV size, moderate LVH, EF 65-70%, LV mid-cavity gradient reaching 63 mmHg with valsalva but minimal at rest, grade I diastolic dysfunction, cannot estimate PA systolic pressure (no TR doppler signal), RV normal. RHC (4/11): Mean RA 11, RV 37/13, PA 33/15, mean PCWP 12, CI 3.3. Echo in 11/12 showed mild LVH, EF 65-70%, no LV mid-cavity gradient was mentioned.  Echo in 4/14 showed EF 65-70%, mild LVH, grade I diastolic dysfunction, PA systolic pressure 35 mmHg, mild LV mid-cavity gradient.  Echo (5/15) with EF 60-65%, mild TR, normal RV size and systolic function. Echo (10/15) with EF 65%, moderate LVH, normal RV.  - ECHO 11/30/15 LVEF 65-70%, Grade 1 DD.  11. ETT-myoview (4/11): 5'30", stopped due to fatigue, normal EF, no evidence for scar or ischemia. 12. V/Q scan 11/12: negative for PE.  Lower extremity venous doppler US (3/14): negative for DVT.  13. PFTs (5/13): FVC 50%, FEV1 62%, ratio 106%, DLCO 69%, TLC 69%.  14. Pancreatic adenocarcinoma: Found in 2018.   Family History:  Family History of Alcoholism/Addiction (parent)  Family History Diabetes 1st degree relative (grandparent)  Family History High cholesterol (parent, grandparent)  Family History Hypertension (parent, grandparent)  Family History Lung cancer (grandparent)  Stomach cancer (grandmother)  Celiac Sprue daughter  Mother with MI at 56, father with MI at 42, uncle with MI at 71, brother with stents in his 77s, multiple aunts with MIs  and CVAs   Social History:  Never Smoked  no alcohol  married, lives with spouse and her mother  retired Network engineer Alcohol Use - no  Illicit Drug Use - no  Review of Systems  All systems reviewed and negative except as per HPI.   ROS: All systems reviewed and negative except as per HPI.   Current Outpatient Prescriptions  Medication Sig Dispense Refill  . aspirin EC 81 MG tablet Take 1 tablet (81 mg total) by mouth daily.    . carvedilol (COREG) 25 MG tablet TAKE 1 TABLET BY MOUTH TWICE DAILY WITH FOOD 60 tablet 5  . Cholecalciferol (VITAMIN D3) 2000 units capsule Take 1 capsule (2,000 Units total) by mouth daily. 90 capsule 3  . gabapentin (NEURONTIN) 300 MG capsule TAKE TWO CAPSULES FOUR TIMES DAILY 240 capsule 1  . hydrALAZINE (APRESOLINE) 25 MG tablet TAKE 1 TABLET (25 MG TOTAL) BY MOUTH 3 (THREE) TIMES DAILY. 90 tablet 3  . insulin aspart (NOVOLOG) 100 UNIT/ML injection Inject 0-20 Units into the skin 3 (three) times daily with meals. . CBG 70 - 120:  0 units CBG 121 - 150: 3 units CBG 151 - 200: 4 units CBG 201 - 250: 7 units CBG 251 - 300: 11 units CBG 301 - 350: 15 units CBG 351 - 400: 20 units    . insulin glargine (LANTUS) 100 UNIT/ML injection Inject 0.3 mLs (30 Units total) into the skin daily.    [name] lactulose (CHRONULAC) 10 GM/15ML solution Take 10 g by mouth daily as needed for mild constipation.    . lidocaine-prilocaine (EMLA) cream Apply to port site prior to being accessed    . lipase/protease/amylase (CREON) 36000 UNITS CPEP capsule 2 po prior to snacks; 4 po prior to meals    . meclizine (ANTIVERT) 25 MG tablet Take by mouth.    . metFORMIN (GLUCOPHAGE) 500 MG tablet Take 500 mg by mouth daily with breakfast.    . ondansetron (ZOFRAN) 8 MG tablet Take by mouth.    Glory Rosebush VERIO test strip USE AS INSTRUCTED UP TO 4 TIMES DAILY 200 each 11  . oxyCODONE (OXY IR/ROXICODONE) 5 MG immediate release tablet Take 1 tablet (5 mg total) by mouth 2 (two) times daily as  needed for severe pain. 60 tablet 0  . rOPINIRole (REQUIP XL) 4 MG 24 hr tablet TAKE ONE TABLET AT BEDTIME 30 tablet 5  . sertraline (ZOLOFT) 50 MG tablet Take 1 tablet (50 mg total) by mouth daily. 30 tablet 3  . spironolactone (ALDACTONE) 25 MG tablet Take 1 tablet (25 mg total) by mouth daily. 90 tablet 3  . traMADol (ULTRAM) 50 MG tablet Take 1 tablet (50 mg total) by mouth every 6 (six) hours as needed. 60 tablet 0   No current facility-administered medications for this encounter.    Vitals:   12/16/16 1533  BP: (!) 136/58  Pulse: 60  SpO2: 96%  Weight: 190 lb 12.8 oz (86.5 kg)   Wt Readings from Last 3 Encounters:  12/16/16 190 lb 12.8 oz (86.5 kg)  11/25/16 192 lb (87.1 kg)  11/24/16 193 lb 8 oz (87.8 kg)    General: Well appearing. No resp difficulty. HEENT: Normal Neck: Supple. JVP 7-8 cm  Carotids 2+ bilat; no bruits. No thyromegaly or nodule noted. Cor: PMI nondisplaced. RRR, 1/6 SEM.  Lungs: CTAB, normal effort. Abdomen: Obese, soft, non-tender, non-distended, no HSM. No bruits or masses. +BS  Extremities: No cyanosis, clubbing, or rash. Trace pedal edema bilaterally.  Neuro: Alert & orientedx3, cranial nerves grossly intact. moves all 4 extremities w/o difficulty. Affect pleasant   Assessment/Plan: 1. Chronic diastolic CHF:   - NYHA III, limited by weakness but not dyspnea.  - Weight is down 29 pounds in the setting of pancreatic cancer.  - Volume stable off torsemide.   2. OHS:  Followed by Dr Elsworth Soho, has previously stated no role for CPAP.  - No change.   3. HTN:  - Well controlled on current regimen.     4. PAD: Bilateral iliac disease on aortoiliac dopplers in 7/17.   - Plan for medical management per Dr. Fletcher Anon - Continue Zetia, cannot take statins.   5. Murmur: Has had vigorous LV contraction with LV mid-cavity gradient in the past. - Continue Coreg   6. Pancreatic cancer:  - Follows with [facility] - No plans for surgery, inoperable tumor.   7.  Chest pain:  - No further. Was atypical. CT negative for PE.   Follow up in 3 months with Dr. Melene Muller, NP  12/16/2016

## 2016-12-26 ENCOUNTER — Ambulatory Visit: Payer: PPO | Admitting: Internal Medicine

## 2016-12-26 ENCOUNTER — Telehealth: Payer: Self-pay | Admitting: Internal Medicine

## 2016-12-26 NOTE — Telephone Encounter (Signed)
Care taker contacted and informed

## 2016-12-26 NOTE — Telephone Encounter (Signed)
The care giver, Langley Gauss she wants to know if the patient can take her gabapentine 2 hours apart not 6 hours. She states the patient is a lot of patient. She has a appointment this afternoon but states they are not going to be able to bring her, she [place] unable to move. They state she is yelling out in pain. Please follow up with them asap.

## 2016-12-26 NOTE — Telephone Encounter (Signed)
Cannot take more often than every 6 hours safely.

## 2016-12-27 ENCOUNTER — Emergency Department (HOSPITAL_COMMUNITY): Payer: PPO

## 2016-12-27 ENCOUNTER — Encounter (HOSPITAL_COMMUNITY): Payer: Self-pay | Admitting: Emergency Medicine

## 2016-12-27 ENCOUNTER — Emergency Department (HOSPITAL_COMMUNITY)
Admission: EM | Admit: 2016-12-27 | Discharge: 2016-12-27 | Disposition: A | Discharge status: Home / Self Care | Payer: PPO | Attending: Emergency Medicine | Admitting: Emergency Medicine

## 2016-12-27 DIAGNOSIS — R112 Nausea with vomiting, unspecified: Secondary | ICD-10-CM | POA: Diagnosis not present

## 2016-12-27 DIAGNOSIS — E860 Dehydration: Secondary | ICD-10-CM

## 2016-12-27 DIAGNOSIS — I110 Hypertensive heart disease with heart failure: Secondary | ICD-10-CM | POA: Diagnosis not present

## 2016-12-27 DIAGNOSIS — I5032 Chronic diastolic (congestive) heart failure: Secondary | ICD-10-CM | POA: Insufficient documentation

## 2016-12-27 DIAGNOSIS — Z794 Long term (current) use of insulin: Secondary | ICD-10-CM | POA: Diagnosis not present

## 2016-12-27 DIAGNOSIS — R1033 Periumbilical pain: Secondary | ICD-10-CM | POA: Diagnosis present

## 2016-12-27 DIAGNOSIS — Z79899 Other long term (current) drug therapy: Secondary | ICD-10-CM | POA: Diagnosis not present

## 2016-12-27 DIAGNOSIS — R109 Unspecified abdominal pain: Secondary | ICD-10-CM | POA: Diagnosis not present

## 2016-12-27 DIAGNOSIS — Z7982 Long term (current) use of aspirin: Secondary | ICD-10-CM | POA: Diagnosis not present

## 2016-12-27 DIAGNOSIS — E1140 Type 2 diabetes mellitus with diabetic neuropathy, unspecified: Secondary | ICD-10-CM | POA: Diagnosis not present

## 2016-12-27 DIAGNOSIS — C250 Malignant neoplasm of head of pancreas: Secondary | ICD-10-CM | POA: Insufficient documentation

## 2016-12-27 DIAGNOSIS — R197 Diarrhea, unspecified: Secondary | ICD-10-CM | POA: Diagnosis not present

## 2016-12-27 DIAGNOSIS — R1110 Vomiting, unspecified: Secondary | ICD-10-CM | POA: Diagnosis not present

## 2016-12-27 HISTORY — DX: Malignant (primary) neoplasm, unspecified: C80.1

## 2016-12-27 LAB — C DIFFICILE QUICK SCREEN W PCR REFLEX
C DIFFICILE (CDIFF) TOXIN: NEGATIVE
C Diff antigen: NEGATIVE
C Diff interpretation: NOT DETECTED

## 2016-12-27 LAB — COMPREHENSIVE METABOLIC PANEL
ALBUMIN: 2.7 g/dL — AB (ref 3.5–5.0)
ALT: 10 U/L — ABNORMAL LOW (ref 14–54)
AST: 15 U/L (ref 15–41)
Alkaline Phosphatase: 161 U/L — ABNORMAL HIGH (ref 38–126)
Anion gap: 9 (ref 5–15)
BILIRUBIN TOTAL: 0.6 mg/dL (ref 0.3–1.2)
BUN: 11 mg/dL (ref 6–20)
CHLORIDE: 107 mmol/L (ref 101–111)
CO2: 22 mmol/L (ref 22–32)
CREATININE: 0.64 mg/dL (ref 0.44–1.00)
Calcium: 8.5 mg/dL — ABNORMAL LOW (ref 8.9–10.3)
GFR calc Af Amer: >60 mL/min (ref >60)
GLUCOSE: 120 mg/dL — AB (ref 65–99)
Potassium: 3.7 mmol/L (ref 3.5–5.1)
SODIUM: 138 mmol/L (ref 135–145)
Total Protein: 6.2 g/dL — ABNORMAL LOW (ref 6.5–8.1)

## 2016-12-27 LAB — URINALYSIS, ROUTINE W REFLEX MICROSCOPIC
BILIRUBIN URINE: NEGATIVE
GLUCOSE, UA: NEGATIVE mg/dL
HGB URINE DIPSTICK: NEGATIVE
KETONES UR: NEGATIVE mg/dL
NITRITE: NEGATIVE
PH: 5 (ref 5.0–8.0)
Protein, ur: NEGATIVE mg/dL
SPECIFIC GRAVITY, URINE: 1.014 (ref 1.005–1.030)

## 2016-12-27 LAB — CBC WITH DIFFERENTIAL/PLATELET
BASOS PCT: 0 %
Basophils Absolute: 0 10*3/uL (ref 0.0–0.1)
EOS ABS: 0.2 10*3/uL (ref 0.0–0.7)
Eosinophils Relative: 2 %
HCT: 29.8 % — ABNORMAL LOW (ref 36.0–46.0)
HEMOGLOBIN: 9.4 g/dL — AB (ref 12.0–15.0)
Lymphocytes Relative: 24 %
Lymphs Abs: 2.1 10*3/uL (ref 0.7–4.0)
MCH: 25 pg — ABNORMAL LOW (ref 26.0–34.0)
MCHC: 31.5 g/dL (ref 30.0–36.0)
MCV: 79.3 fL (ref 78.0–100.0)
MONOS PCT: 8 %
Monocytes Absolute: 0.7 10*3/uL (ref 0.1–1.0)
NEUTROS PCT: 66 %
Neutro Abs: 5.8 10*3/uL (ref 1.7–7.7)
Platelets: 338 10*3/uL (ref 150–400)
RBC: 3.76 MIL/uL — ABNORMAL LOW (ref 3.87–5.11)
RDW: 16.3 % — AB (ref 11.5–15.5)
WBC: 8.8 10*3/uL (ref 4.0–10.5)

## 2016-12-27 LAB — I-STAT CG4 LACTIC ACID, ED: LACTIC ACID, VENOUS: 0.52 mmol/L (ref 0.5–1.9)

## 2016-12-27 LAB — LIPASE, BLOOD: Lipase: 13 U/L (ref 11–51)

## 2016-12-27 LAB — PROTIME-INR
INR: 1.12
Prothrombin Time: 14.3 seconds (ref 11.4–15.2)

## 2016-12-27 LAB — AMMONIA: AMMONIA: 20 umol/L (ref 9–35)

## 2016-12-27 MED ORDER — SODIUM CHLORIDE 0.9 % IV BOLUS (SEPSIS)
500.0000 mL | Freq: Once | INTRAVENOUS | Status: AC
  Administered 2016-12-27: 500 mL via INTRAVENOUS

## 2016-12-27 MED ORDER — IOPAMIDOL (ISOVUE-300) INJECTION 61%
INTRAVENOUS | Status: AC
  Filled 2016-12-27: qty 100

## 2016-12-27 MED ORDER — OXYCODONE HCL 5 MG PO TABS
5.0000 mg | ORAL_TABLET | Freq: Once | ORAL | Status: AC
  Administered 2016-12-27: 5 mg via ORAL
  Filled 2016-12-27: qty 1

## 2016-12-27 MED ORDER — IOPAMIDOL (ISOVUE-300) INJECTION 61%
100.0000 mL | Freq: Once | INTRAVENOUS | Status: AC | PRN
  Administered 2016-12-27: 100 mL via INTRAVENOUS

## 2016-12-27 MED ORDER — HEPARIN SOD (PORK) LOCK FLUSH 100 UNIT/ML IV SOLN
500.0000 [IU] | Freq: Once | INTRAVENOUS | Status: AC
  Administered 2016-12-27: 500 [IU]
  Filled 2016-12-27: qty 5

## 2016-12-27 NOTE — ED Notes (Signed)
Bed: WA25 Expected date:  Expected time:  Means of arrival:  Comments: EMS-N/V 

## 2016-12-27 NOTE — ED Triage Notes (Signed)
Pt comes via EMS with complaints of nausea, vomiting, and diarrhea for the past 2 days.  Stool sample collected on arrival.  Hx of pancreatic cancer with last chemo being in September.  States she has pain around the umbilicus and has experienced more flatulence than normal.  A&O x4.  Ambulatory with assistance.  Vitals in route BP 120/80, HR 90, CBG 129, RR 18.

## 2016-12-27 NOTE — ED Provider Notes (Signed)
[facility] DEPT Provider Note   CSN: [id number] Arrival date & time: 12/27/16  1608     History   Chief Complaint No chief complaint on file.   HPI Melissa Myers is a 71 y.o. female.  The history is provided by the patient and medical records. No language interpreter was used.  Abdominal Pain   This is a new problem. The current episode started yesterday. The problem occurs constantly. The problem has been gradually worsening. The pain is associated with an unknown factor. The pain is located in the periumbilical region. The quality of the pain is aching. The pain is at a severity of 7/10. The pain is moderate. Associated symptoms include diarrhea, nausea and vomiting. Pertinent negatives include fever, melena, constipation, dysuria and frequency. The symptoms are aggravated by palpation. Nothing relieves the symptoms. Past medical history comments: pancreatic cancer.    Past Medical History:  Diagnosis Date  . Anemia   . Cellulitis    LOWER EXTREMITIES  . Chronic diarrhea    a/w nausea - felt related to IBS  . Deaf    left side only  . Diastolic CHF ([place])   . Disc degeneration, lumbar   . GERD (gastroesophageal reflux disease)   . Hyperlipidemia    hx rhabdo on statins  . Hypertension   . Neuropathy    feet, toes and fingers  . On home oxygen therapy    uses oxygen 2 liters min per [place] at night and prn during day  . OSA (obstructive sleep apnea)    05/2009 sleep study - refuses CPAP  . Osteoarthritis   . RLS (restless legs syndrome)   . Shortness of breath    chronic  . Stasis dermatitis   . Type II or unspecified type diabetes mellitus without mention of complication, not stated as uncontrolled    insulin dep    Patient Active Problem List   Diagnosis Date Noted  . UTI (urinary tract infection) 05/19/2016  . Goals of care, counseling/discussion   . Palliative care by specialist   . Malignant neoplasm of head of pancreas  ([place])   . Cholangitis 05/05/2016  . Common bile duct stricture   . Obstructive jaundice   . Hyperbilirubinemia 03/15/2016  . Diabetic peripheral neuropathy ([place]) 02/23/2016  . Murmur, cardiac 07/02/2015  . Chronic pain syndrome 05/05/2015  . MDD (major depressive disorder), single episode, severe ([place]) 05/05/2015  . Chronic anemia 04/14/2015  . IDDM (insulin dependent diabetes mellitus) ([place]) 03/19/2015  . Hyperlipidemia 12/23/2014  . Venous stasis ulcers of both lower extremities ([place]) 01/04/2011  . Chronic diastolic heart failure ([place]) 06/01/2009  . Vitamin D deficiency 05/12/2009  . Sleep apnea 04/29/2009  . Dyslipidemia 04/20/2009  . Morbid obesity ([place]) 04/20/2009  . RESTLESS LEGS SYNDROME 04/20/2009  . Peripheral neuropathy 04/20/2009  . Essential hypertension 04/20/2009    Past Surgical History:  Procedure Laterality Date  . CHOLECYSTECTOMY  1997  . IR CHOLANGIOGRAM EXISTING TUBE  06/09/2016  . IR GENERIC HISTORICAL  05/07/2016   IR BILIARY DRAIN PLACEMENT WITH CHOLANGIOGRAM 05/07/2016 Jacqulynn Cadet, MD [facility]INTER[facility]  . IR GENERIC HISTORICAL  05/19/2016   IR BILIARY STENT(S) EXISTING ACCESS INC DILATION CATH EXCHANGE 05/19/2016 Jacqulynn Cadet, MD [facility]INTER[facility] RAD  . TONSILLECTOMY  1970  . TUBAL LIGATION  1980  . UMBILICAL HERNIA REPAIR  1995  . uterine polyp removal  2008    OB History    No data available       Home  Medications    Prior to Admission medications   Medication Sig Start Date End Date Taking? Authorizing Provider  aspirin EC 81 MG tablet Take 1 tablet (81 mg total) by mouth daily. 07/08/14   Rowe Clack, MD  carvedilol (COREG) 25 MG tablet TAKE 1 TABLET BY MOUTH TWICE DAILY WITH FOOD 01/20/16   Bensimhon, Shaune Pascal, MD  Cholecalciferol (VITAMIN D3) 2000 units capsule Take 1 capsule (2,000 Units total) by mouth daily. 06/17/15   Janith Lima, MD  gabapentin (NEURONTIN) 300 MG capsule TAKE TWO CAPSULES FOUR TIMES DAILY 11/14/16   [name], MD  hydrALAZINE (APRESOLINE) 25 MG tablet TAKE 1 TABLET (25 MG TOTAL) BY MOUTH 3 (THREE) TIMES DAILY. 12/07/16   Larey Dresser, MD  insulin aspart (NOVOLOG) 100 UNIT/ML injection Inject 0-20 Units into the skin 3 (three) times daily with meals. . CBG 70 - 120: 0 units CBG 121 - 150: 3 units CBG 151 - 200: 4 units CBG 201 - 250: 7 units CBG 251 - 300: 11 units CBG 301 - 350: 15 units CBG 351 - 400: 20 units 03/21/16   Mariel Aloe, MD  insulin glargine (LANTUS) 100 UNIT/ML injection Inject 0.3 mLs (30 Units total) into the skin daily. 03/22/16   Mariel Aloe, MD  lactulose (CHRONULAC) 10 GM/15ML solution Take 10 g by mouth daily as needed for mild constipation.    [provider]  lidocaine-prilocaine (EMLA) cream Apply to port site prior to being accessed 09/20/16   [provider]  lipase/protease/amylase (CREON) 36000 UNITS CPEP capsule 2 po prior to snacks; 4 po prior to meals 09/20/16   [provider]  meclizine (ANTIVERT) 25 MG tablet Take by mouth.    [provider]  metFORMIN (GLUCOPHAGE) 500 MG tablet Take 500 mg by mouth daily with breakfast.    [provider]  ondansetron (ZOFRAN) 8 MG tablet Take by mouth. 09/20/16   [provider]  [facility] VERIO test strip USE AS INSTRUCTED UP TO 4 TIMES DAILY 11/03/16   [name], MD  oxyCODONE (OXY IR/ROXICODONE) 5 MG immediate release tablet Take 1 tablet (5 mg total) by mouth 2 (two) times daily as needed for severe pain. 11/03/16   [name], MD  rOPINIRole (REQUIP XL) 4 MG 24 hr tablet TAKE ONE TABLET AT BEDTIME 10/20/16   [name], MD  sertraline (ZOLOFT) 50 MG tablet Take 1 tablet (50 mg total) by mouth daily. 11/03/16   [name], MD  spironolactone (ALDACTONE) 25 MG tablet Take 1 tablet (25 mg total) by mouth daily. 06/17/16 11/24/17  Larey Dresser, MD  traMADol (ULTRAM) 50 MG tablet Take 1 tablet (50 mg total) by mouth  every 6 (six) hours as needed. 11/03/16   [name], MD    Family History Family History  Problem Relation Age of Onset  . Heart disease Mother   . Heart attack Mother 23  . Heart disease Father   . Heart attack Father 35  . Heart disease Unknown        family history  . Stomach cancer Paternal Grandmother   . Lung cancer Paternal Grandfather   . CVA Unknown        several aunts  . Heart attack Unknown        several aunts and an uncle  . Colon cancer Neg Hx     Social History Social History   Tobacco Use  . Smoking  status: Never Smoker  . Smokeless tobacco: Never Used  Substance Use Topics  . Alcohol use: No  . Drug use: No     Allergies   Morphine; Statins; Sulfa antibiotics; Cymbalta [duloxetine hcl]; Sulfasalazine; Levemir [insulin detemir]; and Zinc   Review of Systems Review of Systems  Constitutional: Positive for appetite change (decreased) and fatigue. Negative for chills, diaphoresis and fever.  HENT: Negative for congestion.   Respiratory: Negative for cough, chest tightness, shortness of breath and stridor.   Cardiovascular: Negative for chest pain, palpitations and leg swelling.  Gastrointestinal: Positive for abdominal pain, diarrhea, nausea and vomiting. Negative for constipation and melena.  Genitourinary: Positive for decreased urine volume. Negative for difficulty urinating, dysuria, flank pain and frequency.  Musculoskeletal: Negative for back pain, neck pain and neck stiffness.  Skin: Negative for rash and wound.  Neurological: Negative for seizures, weakness and light-headedness.  Psychiatric/Behavioral: Negative for agitation and confusion.  All other systems reviewed and are negative.    Physical Exam Updated Vital Signs BP 140/86   Pulse 73   Resp 18   SpO2 98%   Physical Exam  Constitutional: She is oriented to person, place, and time. She appears well-developed and well-nourished. No distress.  HENT:  Head:  Normocephalic.  Mouth/Throat: Oropharynx is clear and moist. No oropharyngeal exudate.  Eyes: Conjunctivae and EOM are normal. Pupils are equal, round, and reactive to light.  Neck: Normal range of motion. No JVD present.  Cardiovascular: Normal rate and intact distal pulses.  No murmur heard. Pulmonary/Chest: Effort normal. She has no wheezes. She has no rales. She exhibits no tenderness.  Abdominal: Soft. Normal appearance. There is tenderness in the periumbilical area. There is no rigidity, no rebound and no CVA tenderness.    Musculoskeletal: She exhibits no tenderness.  Neurological: She is alert and oriented to person, place, and time. No sensory deficit. She exhibits normal muscle tone.  Skin: Capillary refill takes less than 2 seconds. No rash noted. She is not diaphoretic. No erythema.  Psychiatric: She has a normal mood and affect.  Nursing note and vitals reviewed.    ED Treatments / Results  Labs (all labs ordered are listed, but only abnormal results are displayed) Labs Reviewed  CBC WITH DIFFERENTIAL/PLATELET - Abnormal; Notable for the following components:      Result Value   RBC 3.76 (*)    Hemoglobin 9.4 (*)    HCT 29.8 (*)    MCH 25.0 (*)    RDW 16.3 (*)    All other components within normal limits  COMPREHENSIVE METABOLIC PANEL - Abnormal; Notable for the following components:   Glucose, Bld 120 (*)    Calcium 8.5 (*)    Total Protein 6.2 (*)    Albumin 2.7 (*)    ALT 10 (*)    Alkaline Phosphatase 161 (*)    All other components within normal limits  URINALYSIS, ROUTINE W REFLEX MICROSCOPIC - Abnormal; Notable for the following components:   APPearance HAZY (*)    Leukocytes, UA MODERATE (*)    Bacteria, UA MANY (*)    Squamous Epithelial / LPF 6-30 (*)    All other components within normal limits  C DIFFICILE QUICK SCREEN W PCR REFLEX  URINE CULTURE  LIPASE, BLOOD  PROTIME-INR  AMMONIA  I-STAT CG4 LACTIC ACID, ED    EKG  EKG  Interpretation None       Radiology Ct Abdomen Pelvis W Contrast  Result Date: 12/27/2016 CLINICAL DATA:  Nausea, vomiting  and diarrhea for 2 days. History of pancreatic cancer treated with chemotherapy. EXAM: CT ABDOMEN AND PELVIS WITH CONTRAST TECHNIQUE: Multidetector CT imaging of the abdomen and pelvis was performed using the standard protocol following bolus administration of intravenous contrast. CONTRAST:  100 cc Isovue 300 intravenously. COMPARISON:  11/10/2016 FINDINGS: Lower chest: Mitral valve calcifications versus prosthesis. Small hiatal hernia. Hepatobiliary: Postcholecystectomy. No evidence of hepatic masses. Pneumobilia. Pancreas: Known hypoattenuated ill-defined mass in the head of the pancreas is relatively stable in size measuring 3.0 x 3.1 cm. Extensive wispy peripancreatic fat stranding. The pancreatic mass is inseparable from the duodenum. Increased soft tissue thickening along the superior mesenteric vein and main portal vein likely represents tumor extension superiorly. It completely encases the extrahepatic common bile duct containing metallic stent. Spleen: Scatter hypoattenuated lesions throughout the spleen. Adrenals/Urinary Tract: Adrenal glands are unremarkable. Kidneys are without renal calculi, focal lesion, or hydronephrosis. Bilateral renal cysts. Bladder is unremarkable. Stomach/Bowel: No evidence of small-bowel obstruction. The colon is normal. Vascular/Lymphatic: Shotty retroperitoneal lymph nodes in the upper abdomen. Reproductive: Uterus and bilateral adnexa are unremarkable. Other: No abdominal wall hernia or abnormality. No abdominopelvic ascites. Musculoskeletal: No evidence of suspicious osseous lesions. IMPRESSION: Interval extension of the known pancreatic head mass superiorly consistent with local progression of disease. Metallic common bile duct stent in stable position with expected pneumobilia. No evidence of small-bowel obstruction. Electronically Signed    By: Fidela Salisbury M.D.   On: 12/27/2016 19:35    Procedures Procedures (including critical care time)  Medications Ordered in ED Medications  sodium chloride 0.9 % bolus 500 mL (0 mLs Intravenous Stopped 12/27/16 1856)  iopamidol (ISOVUE-300) 61 % injection 100 mL (100 mLs Intravenous Contrast Given 12/27/16 1849)  oxyCODONE (Oxy IR/ROXICODONE) immediate release tablet 5 mg (5 mg Oral Given 12/27/16 2132)  sodium chloride 0.9 % bolus 500 mL (0 mLs Intravenous Stopped 12/27/16 2205)  heparin lock flush 100 unit/mL (500 Units Intracatheter Given 12/27/16 2327)     Initial Impression / Assessment and Plan / ED Course  I have reviewed the triage vital signs and the nursing notes.  Pertinent labs & imaging results that were available during my care of the patient were reviewed by me and considered in my medical decision making (see chart for details).     CATHRYNE MANCEBO is a 71 y.o. female with a past medical history significant for pancreatic cancer, CHF, diabetes, hypertension, and GERD who presents with abdominal pain, decreased oral intake, nausea, vomiting, diarrhea, weight loss, fatigue, and report of confusion.  Patient reports that she was told to come to the ED for evaluation and hydration in the setting of likely dehydration.  Patient says that she has had numerous episodes of diarrhea over the last 2 days occurring approximately once every hour yesterday.  According to home health nurse augmentation today, patient's diarrhea has improved.  Patient also had numerous episodes of nausea and vomiting yesterday.  Patient continues to have some nausea but vomiting has improved today.  The problem today is that patient has had worsening abdominal pain.  She describes that it starting overnight and today.  Is a constant pain that is 7 out of 10 in severity.  It is in her central periumbilical abdomen.  She says the pain is new.  She reports that she is still passing gas.  Says that she has  not been eating or drinking as much in the last few weeks.  According to documentation in the chart from care everywhere, patient's  family reported patient was acting more confused.  On my initial evaluation, patient is answering questions appropriately.  Patient does state that she has had decrease in urination.  She denies any dysuria or foul smell in the urine.  Patient denies fevers, chills, or any URI type symptoms.  She denies chest pain or shortness of breath.  She does report that she is extremely fatigued.  On my exam, patient's abdomen is tender patient had bowel sounds present.  Patient's lungs were clear.  Chest was nontender.  Had no flank tenderness.  Patient's legs had mild edema in her bilateral legs.  Given patient's abdominal pain in the setting of pancreatic cancer, patient will have CT imaging to evaluate.  Patient will have laboratory testing for possible dehydration with electrolyte normality's.  With her reported decreased urine, patient may have acute kidney injury from dehydration.  Patient will also have urinalysis to look for infection.  Will give a small amount of fluids given the patient's CHF history but clinically patient appears dehydrated with dry mouth.  Anticipate reassessment after workup.  Diagnostic testing results seen above.  Lactic acid on elevated.  Lipase normal.  Not elevated.  CBC shows no leukocytosis and similar anemia.  Ammonia normal.  Metabolic panel reassuring.  C. difficile testing negative.  Urinalysis shows leukocytes and bacteria however there is also numerous squamous cells.  Suspect contamination rather than UTI given the lack of urine symptoms.  CT of the abdomen and pelvis showed progression of the patient's pancreatic head mass but no evidence of obstruction or abscess.  Patient reassessed and had improvement in her pain.  Patient was given oxycodone which she normally takes at home with improvement in symptoms.  Patient was able to tolerate  eating and drinking and received 1 L of normal saline.  Suspect mild dehydration in the setting of her GI symptoms.  Given patient's reassuring workup and her improvement in symptoms, and given her demonstratable stability and p.o. tolerance, patient was felt to be stable for discharge home.  Patient given extremely strict return precautions for any new or worsening symptoms.  Patient will follow up with her PCP in the next several days.  Patient and family had no other questions or concerns and patient was discharged in good condition.   Final Clinical Impressions(s) / ED Diagnoses   Final diagnoses:  Nausea vomiting and diarrhea  Dehydration     Clinical Impression: 1. Nausea vomiting and diarrhea   2. Dehydration     Disposition: Discharge  Condition: Good  I have discussed the results, Dx and Tx plan with the pt(& family if present). He/she/they expressed understanding and agree(s) with the plan. Discharge instructions discussed at great length. Strict return precautions discussed and pt &/or family have verbalized understanding of the instructions. No further questions at time of discharge.    This SmartLink is deprecated. Use AVSMEDLIST instead to display the medication list for a patient.  Follow Up: [name], MD [place] [id number] [phone]  Schedule an appointment as soon as possible for a visit    [facility] DEPT [place] [id number] [place] [phone]  If symptoms worsen     Elesa Garman, Gwenyth Allegra, MD 12/28/16 0130

## 2016-12-27 NOTE — Discharge Instructions (Signed)
Your workup today showed improvement in your electrolytes.  We suspect you were dehydrated in the setting of your recent nausea, vomiting, and diarrhea.  Your CT scan did not show evidence of obstruction but did show some growth of your pancreatic mass.  Please call to follow-up with your primary physician for further management.   if any symptoms change or worsen, please return to the nearest ED.

## 2016-12-30 ENCOUNTER — Encounter: Payer: Self-pay | Admitting: Internal Medicine

## 2016-12-30 ENCOUNTER — Ambulatory Visit: Payer: PPO | Admitting: Internal Medicine

## 2016-12-30 VITALS — BP 138/70 | HR 57 | Temp 98.4°F | Ht 62.0 in | Wt 185.0 lb

## 2016-12-30 DIAGNOSIS — Z794 Long term (current) use of insulin: Secondary | ICD-10-CM | POA: Diagnosis not present

## 2016-12-30 DIAGNOSIS — C250 Malignant neoplasm of head of pancreas: Secondary | ICD-10-CM | POA: Diagnosis not present

## 2016-12-30 DIAGNOSIS — E119 Type 2 diabetes mellitus without complications: Secondary | ICD-10-CM

## 2016-12-30 DIAGNOSIS — G894 Chronic pain syndrome: Secondary | ICD-10-CM

## 2016-12-30 DIAGNOSIS — E1142 Type 2 diabetes mellitus with diabetic polyneuropathy: Secondary | ICD-10-CM

## 2016-12-30 DIAGNOSIS — IMO0001 Reserved for inherently not codable concepts without codable children: Secondary | ICD-10-CM

## 2016-12-30 DIAGNOSIS — Z7189 Other specified counseling: Secondary | ICD-10-CM | POA: Diagnosis not present

## 2016-12-30 LAB — URINE CULTURE: Culture: >=100000 — AB

## 2016-12-30 MED ORDER — LIDOCAINE 5 % EX PTCH: 1.0000 | MEDICATED_PATCH | CUTANEOUS | 0 refills | Status: AC

## 2016-12-30 MED ORDER — FREESTYLE LIBRE SENSOR SYSTEM MISC: 0 refills | Status: AC

## 2016-12-30 MED ORDER — PREGABALIN 75 MG PO CAPS: 75.0000 mg | ORAL_CAPSULE | Freq: Every day | ORAL | 3 refills | Status: DC

## 2016-12-30 MED ORDER — MIRTAZAPINE 15 MG PO TABS: 15.0000 mg | ORAL_TABLET | Freq: Every day | ORAL | 3 refills | Status: DC

## 2016-12-30 NOTE — Assessment & Plan Note (Signed)
Referral to oncology locally. Evaluated at [facility] and not deemed to be a surgical candidate which I feel is appropriate. They would like to talk about possible medical options (daughter in law is pharmacist).

## 2016-12-30 NOTE — Assessment & Plan Note (Signed)
Will refer to local oncology but given progression and not a surgical candidate given poor functional status in my opinion and they agree generally may be moving more toward palliation. They are aware of the growth of the tumor on recent CT scan but are interested in pursuing any more medical options.

## 2016-12-30 NOTE — Patient Instructions (Addendum)
We have sent in lidoderm patches to use for the back.   We have sent in the sugar monitoring libre although we will likely have to do a PA.   We will get you in with the oncologist in town.   You can add lyrica in the morning to the gabapentin to see if it helps more.  You can eat whatever you want and we can adjust the insulin if needed.

## 2016-12-30 NOTE — Assessment & Plan Note (Addendum)
Cancer related pain and using oxycodone and tramadol alternating. New rx today for lidoderm patches to help with her pain since she is on so many medications to limit polypharmacy and sedation from oral meds.

## 2016-12-30 NOTE — Assessment & Plan Note (Signed)
Taking gabapentin maximal doses and will add lyrica 75 mg qam to help boost efficacy as this is a huge problem causing QOL loss.

## 2016-12-30 NOTE — Assessment & Plan Note (Signed)
Multiple injections daily and volatile readings. Rx for freestyle libre to help with monitoring. She is currently checking 4 times per day or more.

## 2016-12-30 NOTE — Progress Notes (Signed)
   Subjective:    Patient ID: Melissa Myers, female    DOB: 1945-09-20, 71 y.o.   MRN: [id number]  HPI The patient is a 71 YO female coming in for ER follow up. She was having abdominal pain and nausea as well as dehydration. She denies vomiting. She had CT scan in the ER which did not show obstruction but did show some extension of her pancreatic cancer. She still has stent in place. No jaundice, change in liver enzymes or electrolyte abnormalities. She is not having much appetite. Sleeping okay and napping some during the day. Not as much energy. She is having a lot of foot pain and the gabapentin helps some but not lasting the full 6 hours between pills. Previously had been on lyrica alone and it helped some. She is wanting to go back to oncology in town as the trips to [facility] are too much to manage for her and her family. Her son and caretaker are with her today. She is still having a lot of short term memory inconsistency (no brain mets). She is having more abdominal pain in the last several weeks. Needs freestyle libre due to volatile sugars and needing to check sugars multiple times per day and painful due to multiple sticks. With her pancreatic cancer her sugars are especially hard to control.    PMH, [facility], social history reviewed and updated  Review of Systems  Constitutional: Positive for activity change, appetite change and fatigue. Negative for chills, fever and unexpected weight change.  HENT: Negative.   Eyes: Negative.   Respiratory: Negative for cough, chest tightness and shortness of breath.   Cardiovascular: Negative for chest pain, palpitations and leg swelling.  Gastrointestinal: Positive for abdominal distention, abdominal pain and nausea. Negative for constipation, diarrhea and vomiting.  Musculoskeletal: Positive for arthralgias and back pain.  Skin: Positive for color change.  Neurological: Positive for weakness and numbness. Negative for dizziness, seizures, syncope,  facial asymmetry and speech difficulty.  Psychiatric/Behavioral: Negative.       Objective:   Physical Exam  Constitutional: She is oriented to person, place, and time. She appears well-developed and well-nourished.  HENT:  Head: Normocephalic and atraumatic.  Eyes: EOM are normal.  Neck: Normal range of motion.  Cardiovascular: Normal rate and regular rhythm.  Pulmonary/Chest: Effort normal and breath sounds normal. No respiratory distress. She has no wheezes. She has no rales.  Abdominal: Soft. Bowel sounds are normal. She exhibits distension. There is tenderness. There is no rebound.  Musculoskeletal: She exhibits tenderness. She exhibits no edema.  Neurological: She is alert and oriented to person, place, and time. Coordination abnormal.  Walker in the home and wheelchair for travel  Skin: Skin is warm and dry.  Chronic venous stasis bilateral legs  Psychiatric: She has a normal mood and affect.   Vitals:   12/30/16 1341  BP: 138/70  Pulse: (!) 57  Temp: 98.4 F (36.9 C)  TempSrc: Oral  SpO2: 100%  Weight: 185 lb (83.9 kg)  Height: 5\' 2"  (1.575 m)       Assessment & Plan:

## 2016-12-31 ENCOUNTER — Telehealth: Payer: Self-pay

## 2016-12-31 NOTE — Telephone Encounter (Signed)
Post ED Visit - Positive Culture Follow-up: Unsuccessful Patient Follow-up  Culture assessed and recommendations reviewed by:  []  Elenor Quinones, Pharm.D. []  Heide Guile, Pharm.D., BCPS AQ-ID [x]  Parks Neptune, Pharm.D., BCPS []  Alycia Rossetti, Pharm.D., BCPS []  [place], Pharm.D., BCPS, AAHIVP []  Legrand Como, Pharm.D., BCPS, AAHIVP []  Salome Arnt, PharmD, BCPS []  Dimitri Ped, PharmD, BCPS []  Vincenza Hews, PharmD, BCPS  Positive urine culture  [x]  Patient discharged without antimicrobial prescription and treatment is now indicated []  Organism is resistant to prescribed ED discharge antimicrobial []  Patient with positive blood cultures   for symtom check, may need abx Amoxicillin 500 mg BID x 1 week if still with symptoms Unable to contact patient after 3 attempts, letter will be sent to address on file  Genia Del 12/31/2016, 4:58 PM

## 2016-12-31 NOTE — Progress Notes (Signed)
ED Antimicrobial Stewardship Positive Culture Follow Up   Melissa Myers is an 71 y.o. female who presented to [facility] on 12/27/2016 with a chief complaint of  Chief Complaint  Patient presents with  . Emesis  . Diarrhea    Recent Results (from the past 720 hour(s))  C difficile quick scan w PCR reflex     Status: None   Collection Time: 12/27/16  4:45 PM  Result Value Ref Range Status   C Diff antigen NEGATIVE NEGATIVE Final   C Diff toxin NEGATIVE NEGATIVE Final   C Diff interpretation No C. difficile detected.  Final  Urine culture     Status: Abnormal   Collection Time: 12/27/16  6:15 PM  Result Value Ref Range Status   Specimen Description URINE, RANDOM  Final   Special Requests NONE  Final   Culture >=100,000 COLONIES/mL ENTEROCOCCUS FAECALIS (A)  Final   Report Status 12/30/2016 FINAL  Final   Organism ID, Bacteria ENTEROCOCCUS FAECALIS (A)  Final      Susceptibility   Enterococcus faecalis - MIC*    AMPICILLIN <=2 SENSITIVE Sensitive     LEVOFLOXACIN 1 SENSITIVE Sensitive     NITROFURANTOIN <=16 SENSITIVE Sensitive     VANCOMYCIN 1 SENSITIVE Sensitive     * >=100,000 COLONIES/mL ENTEROCOCCUS FAECALIS   Will call for symptom check Add amox 500 bid x 1 week if no improvement  ED Provider: Margarita Mail, PA-C  Wynell Balloon 12/31/2016, 8:26 AM Infectious Diseases Pharmacist Phone# [phone]

## 2017-01-02 ENCOUNTER — Telehealth: Payer: Self-pay | Admitting: Internal Medicine

## 2017-01-02 NOTE — Telephone Encounter (Signed)
Pt scheduled for Tuesday at 1:45pm.
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
## 2017-01-03 ENCOUNTER — Ambulatory Visit: Payer: PPO | Admitting: Internal Medicine

## 2017-01-03 ENCOUNTER — Encounter: Payer: Self-pay | Admitting: Internal Medicine

## 2017-01-03 DIAGNOSIS — R109 Unspecified abdominal pain: Secondary | ICD-10-CM

## 2017-01-03 MED ORDER — SILVER SULFADIAZINE 1 % EX CREA: 1.0000 "application " | TOPICAL_CREAM | Freq: Every day | CUTANEOUS | 0 refills | Status: DC
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
## 2017-01-04 DIAGNOSIS — R109 Unspecified abdominal pain: Secondary | ICD-10-CM | POA: Insufficient documentation
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
## 2017-01-06 ENCOUNTER — Telehealth: Payer: Self-pay | Admitting: Hematology

## 2017-01-06 NOTE — Telephone Encounter (Signed)
Spoke to patients son regarding upcoming November appointments per 11/12 sch message.

## 2017-01-09 ENCOUNTER — Telehealth: Payer: Self-pay | Admitting: Internal Medicine

## 2017-01-09 NOTE — Progress Notes (Signed)
Melissa Myers  [phone] [phone]) [phone]  Clinic Follow Up Note   Patient Care Team: [name], MD as PCP - General (Internal Medicine) Rigoberto Noel, MD as Consulting Physician (Pulmonary Disease) Larey Dresser, MD as Consulting Physician (Cardiology) Megan Salon, MD as Consulting Physician (Gynecology) Kristeen Miss, MD as Consulting Physician (Neurosurgery) Earlie Server, MD (Orthopedic Surgery) Almedia Balls, MD (Orthopedic Surgery) Lafayette Dragon, MD (Inactive) (Gastroenterology) Renato Shin, MD (Endocrinology) Darleen Crocker, MD (Ophthalmology) 01/10/2017  CHIEF COMPLAINTS:  Follow up pancreatic Cancer   HISTORY OF PRESENTING ILLNESS (05/19/2016):  [name] 71 y.o. female is here becauseThe patient presented to the ED on 03/15/16 due to weakness, hyperglycemia, jaundice, abdominal pain, mild confusion, and chronic diarrhea. Physical exam at the time revealed abdominal distension and diffuse tenderness. Labs found the patient hyperbilirubinemic. CT scan of the abdomen showed pancreatic atrophy with moderate pancreatic duct dilatation, followed to the level of the duodenum. 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 Considerations include otherwise occult stricture, periampullary lesion, or main duct intraductal papillary mucinous neoplasm. MRI of the abdomen the same day showed a 2.2 x 2.8 cm hypoenhancing lesion in the uncinate process of the pancreas, better demonstrated by MRI than on the recent CT scan. The dilated main pancreatic duct and dilated common bile duct abruptly terminate at the cranial margin of this lesion. MRI features are concerning for pancreatic adenocarcinoma.CA 19-9 on 03/17/16 was 107; elevated.  The patient underwent ERCP, with biliary sphincterotomy and biliary stent placement by Dr. Henrene Pastor 03/18/16. The patient was subsequently discharged on 03/21/16.  On 03/27/16, the patient presented to the ED on 03/29/16  for flu like symptoms. She was Influenza A positive. She was given fluids and discharged with Tamiflu. She presented back to the ED the following day for worsening symptoms. She was started on antibiotics for possible developing pneumonia giving a productive cough.  Given enough time to recover from her hospitalization and the flu, the patient underwent biopsy of the pancreatic mass by Dr. Ardis Hughs on 04/28/16 revealing adenocarcinoma.  Since then, the patient presented to the ED on 05/04/16 for a headaches, hyperglycemia, confusion, abdominal pain, and nausea. Given presentation of fever/confusion, she was admitted to the hospital.  CT of the head on 05/05/16 was negative. CT of the abd/pelvis on 05/05/16 showed interval placement of a bile duct stent with some decompression of the bile ducts, mild residual biliary dilatation, and pancreatic ductal dilatation and atrophy.  She was diagnosed with cholangitis and recurrent jaundice. Zosyn was started. ERCP on 05/06/16 showed a clogged stent which was removed, though a new stent was not able to be placed across a malignant biliary stricture. On 05/07/16, percutaneous transhepatic cholangiogram and placement of a 6F internal/external biliary drain was performed by IR with aspirated purulent bile sent for culture. Blood cultures remained negative, though bile culture grew pantoea species, enterococcus faecalis, viridans streptococcus.Urine culture had also grown Klebsiella pneumoniae, sensitive to Zosyn.  I see the patient today in an inpatient setting to discuss treatment options for the management of her pancreatic adenocarcinoma. I met patient, her husband and son in her room. According to patient's family, she used to be independent, does self care and housework, walks with a walker. Due to fatigue, anorexia, she has not been able to perform her daily activities in the past 4-6 weeks, and has spent most time in bed and chair for the past few weeks.     CURRENT THERAPY: chemotherapy with gemcitabine and abraxane every  2 weeks, started in 08/2016  INTERIM HISTORY: Nayab returns for follow-up. She was accompanied by her friend.  Initially saw her in my clinic in March 2018 where she was diagnosed with pancreatic cancer, she subsequently went to [facility] for treatment.  She is written a request to transfer her care back to [place], and she is here for follow-up.    Patient is being followed by Medical Oncologist, Dr. Renda Rolls at [facility]. She was treated with adjuvant chemotherapy gemcitabine and abraxane every 2 weeks and was started in July 2018. She had approximately 8 total chemo treatments. Patient last appointment with Dr. Renda Rolls at [facility] was on 11/15/2016. She completed SBRT with Dr. Lisbeth Renshaw in June 2018. She was evaluated by Dr. Francisco Capuchin and was advised that she is not a good candidate for surgery at this time. Her port was last flushed over 2 months ago.   Of note, since the patient's last visit to the office, she has had a CT CAP completed on 12/27/2016 with results revealing: IMPRESSION: Interval extension of the known pancreatic head mass superiorly consistent with local progression of disease. Metallic common bile duct stent in stable position with expected pneumobilia. No evidence of small-bowel obstruction. She also had a CT Angio Chest PE W or WO Contrast completed on 12/08/2016 with results revealing: IMPRESSION: No evidence of pulmonary embolism. Thickening of the pulmonic valve ; if there is clinical concern for valvular disease consider echocardiography. Mild dilatation of the main pulmonary artery which may reflect pulmonary artery hypertension. Atherosclerotic calcifications aorta and coronary arteries. Aortic Atherosclerosis (ICD10-I70.0).  On review of systems, she reports diarrhea x 5 days that is untreated with imodium. She had watery stools x 10 episodes this morning. She is able to consume water. She reports abdominal cramping and pain  that alleviates following a bowel movement. she reports nausea and emesis. She notes ongoing lower back pain since June 2018. She typically rates her overall pain as 5-6/10. She uses ocxycodone once daily and she needs a refill of this. Friend reports that that on prior novalog, her symptoms were exacerbated, but her symptoms have not been worsened with lantus prescription. She denies fever. She reports decreased appetite that she treats with remeron. She denies leg swelling at this time. She reports that she is able to cook and clean with a walker.      MEDICAL HISTORY:  Past Medical History:  Diagnosis Date  . Anemia   . Cancer [facility])    Pancreatic  . Cellulitis    LOWER EXTREMITIES  . Chronic diarrhea    a/w nausea - felt related to IBS  . Deaf    left side only  . Diastolic CHF ([place])   . Disc degeneration, lumbar   . GERD (gastroesophageal reflux disease)   . Hyperlipidemia    hx rhabdo on statins  . Hypertension   . Neuropathy    feet, toes and fingers  . On home oxygen therapy    uses oxygen 2 liters min per [place] at night and prn during day  . OSA (obstructive sleep apnea)    05/2009 sleep study - refuses CPAP  . Osteoarthritis   . RLS (restless legs syndrome)   . Shortness of breath    chronic  . Stasis dermatitis   . Type II or unspecified type diabetes mellitus without mention of complication, not stated as uncontrolled    insulin dep    SURGICAL HISTORY: Past Surgical History:  Procedure Laterality Date  . BILIARY  STENT PLACEMENT N/A 05/06/2016   Procedure: BILIARY STENT PLACEMENT;  Surgeon: Gatha Mayer, MD;  Location: [place];  Service: Endoscopy;  Laterality: N/A;  . CHOLECYSTECTOMY  1997  . COLONOSCOPY N/A 12/03/2012   Procedure: COLONOSCOPY;  Surgeon: Lafayette Dragon, MD;  Location: [facility];  Service: Endoscopy;  Laterality: N/A;  . ENDOSCOPIC RETROGRADE CHOLANGIOPANCREATOGRAPHY (ERCP) WITH PROPOFOL N/A 05/06/2016   Procedure: ENDOSCOPIC RETROGRADE  CHOLANGIOPANCREATOGRAPHY (ERCP) WITH PROPOFOL;  Surgeon: Gatha Mayer, MD;  Location: [place];  Service: Endoscopy;  Laterality: N/A;  . ERCP N/A 03/18/2016   Procedure: ENDOSCOPIC RETROGRADE CHOLANGIOPANCREATOGRAPHY (ERCP);  Surgeon: Irene Shipper, MD;  Location: [facility];  Service: Endoscopy;  Laterality: N/A;  . EUS N/A 04/28/2016   Procedure: UPPER ENDOSCOPIC ULTRASOUND (EUS) LINEAR;  Surgeon: [name], MD;  Location: [facility];  Service: Endoscopy;  Laterality: N/A;  . IR CHOLANGIOGRAM EXISTING TUBE  06/09/2016  . IR GENERIC HISTORICAL  05/07/2016   IR BILIARY DRAIN PLACEMENT WITH CHOLANGIOGRAM 05/07/2016 Jacqulynn Cadet, MD [facility]-INTER[facility] RAD  . IR GENERIC HISTORICAL  05/19/2016   IR BILIARY STENT(S) EXISTING ACCESS INC DILATION CATH EXCHANGE 05/19/2016 Jacqulynn Cadet, MD [facility]-INTERV RAD  . TONSILLECTOMY  1970  . TUBAL LIGATION  1980  . UMBILICAL HERNIA REPAIR  1995  . uterine polyp removal  2008    SOCIAL HISTORY: Social History   Socioeconomic History  . Marital status: Married    Spouse name: Not on file  . Number of children: Not on file  . Years of education: Not on file  . Highest education level: Not on file  Social Needs  . Financial resource strain: Not on file  . Food insecurity - worry: Not on file  . Food insecurity - inability: Not on file  . Transportation needs - medical: Not on file  . Transportation needs - non-medical: Not on file  Occupational History  . Not on file  Tobacco Use  . Smoking status: Never Smoker  . Smokeless tobacco: Never Used  Substance and Sexual Activity  . Alcohol use: No  . Drug use: No  . Sexual activity: No  Other Topics Concern  . Not on file  Social History Narrative   Lives with spouse and mother. Retired Network engineer, now housewife    FAMILY HISTORY: Family History  Problem Relation Age of Onset  . Heart disease Mother   . Heart attack Mother 2  . Heart disease Father   . Heart attack Father 63  . Heart  disease Unknown        family history  . Stomach cancer Paternal Grandmother   . Lung cancer Paternal Grandfather   . CVA Unknown        several aunts  . Heart attack Unknown        several aunts and an uncle  . Colon cancer Neg Hx     ALLERGIES:  is allergic to morphine; statins; sulfa antibiotics; cymbalta [duloxetine hcl]; sulfasalazine; levemir [insulin detemir]; and zinc.  MEDICATIONS:  Current Outpatient Medications  Medication Sig Dispense Refill  . aspirin EC 81 MG tablet Take 1 tablet (81 mg total) by mouth daily.    . carvedilol (COREG) 25 MG tablet TAKE 1 TABLET BY MOUTH TWICE DAILY WITH FOOD 60 tablet 5  . Cholecalciferol (VITAMIN D3) 2000 units capsule Take 1 capsule (2,000 Units total) by mouth daily. 90 capsule 3  . Continuous Blood Gluc Sensor (FREESTYLE LIBRE SENSOR SYSTEM) MISC Use to check sugars 1 each 0  .  gabapentin (NEURONTIN) 300 MG capsule TAKE TWO CAPSULES FOUR TIMES DAILY 240 capsule 1  . hydrALAZINE (APRESOLINE) 25 MG tablet TAKE 1 TABLET (25 MG TOTAL) BY MOUTH 3 (THREE) TIMES DAILY. 90 tablet 3  . insulin aspart (NOVOLOG) 100 UNIT/ML injection Inject 0-20 Units into the skin 3 (three) times daily with meals. . CBG 70 - 120: 0 units CBG 121 - 150: 3 units CBG 151 - 200: 4 units CBG 201 - 250: 7 units CBG 251 - 300: 11 units CBG 301 - 350: 15 units CBG 351 - 400: 20 units    . insulin glargine (LANTUS) 100 UNIT/ML injection Inject 0.3 mLs (30 Units total) into the skin daily.    [name] lidocaine (LIDODERM) 5 % Place 1 patch daily onto the skin. Remove & Discard patch within 12 hours or as directed by MD 30 patch 0  . lidocaine-prilocaine (EMLA) cream Apply to port site prior to being accessed    . lipase/protease/amylase (CREON) 36000 UNITS CPEP capsule 2 po prior to snacks; 4 po prior to meals    . meclizine (ANTIVERT) 25 MG tablet Take 25 mg 2 (two) times daily as needed by mouth for dizziness.     . metFORMIN (GLUCOPHAGE) 500 MG tablet Take 500 mg by mouth  daily with breakfast.    . ondansetron (ZOFRAN) 8 MG tablet Take 1 tablet (8 mg total) by mouth every 8 (eight) hours as needed for nausea or vomiting. 30 tablet 2  . ONETOUCH VERIO test strip USE AS INSTRUCTED UP TO 4 TIMES DAILY 200 each 11  . oxyCODONE (OXY IR/ROXICODONE) 5 MG immediate release tablet Take 1 tablet (5 mg total) by mouth 2 (two) times daily as needed for severe pain. 60 tablet 0  . pregabalin (LYRICA) 75 MG capsule Take 1 capsule (75 mg total) daily by mouth. 30 capsule 3  . rOPINIRole (REQUIP XL) 4 MG 24 hr tablet TAKE ONE TABLET AT BEDTIME 30 tablet 5  . silver sulfADIAZINE (SILVADENE) 1 % cream Apply 1 application daily topically. 50 g 0  . spironolactone (ALDACTONE) 25 MG tablet Take 1 tablet (25 mg total) by mouth daily. 90 tablet 3  . traMADol (ULTRAM) 50 MG tablet Take 1 tablet (50 mg total) by mouth every 6 (six) hours as needed. (Patient taking differently: Take 50 mg every 6 (six) hours as needed by mouth for moderate pain. ) 60 tablet 0  . diphenoxylate-atropine (LOMOTIL) 2.5-0.025 MG tablet Take 1-2 tablets by mouth 4 (four) times daily as needed for diarrhea or loose stools. 30 tablet 2  . lactulose (CHRONULAC) 10 GM/15ML solution Take 10 g by mouth daily as needed for mild constipation.    . mirtazapine (REMERON) 30 MG tablet Take 1 tablet (30 mg total) by mouth at bedtime. 30 tablet 2   No current facility-administered medications for this visit.     REVIEW OF SYSTEMS:   Constitutional: (+) fatigue. (+) decreased appetite Eyes: Denies blurriness of vision, double vision or watery eyes Ears, nose, mouth, throat, and face: Denies mucositis or sore throat Respiratory: Denies cough, dyspnea or wheezes Cardiovascular: Denies palpitation, chest discomfort or lower extremity swelling Gastrointestinal:  (+) nausea, (+) emesis, (+) loose stools, (+) abdominal pain and cramping Skin: Denies abnormal skin rashes Lymphatics: Denies new lymphadenopathy or easy  bruising Neurological:Denies numbness, tingling or new weaknesses Behavioral/Psych: Mood is stable, no new changes  All other systems were reviewed with the patient and are negative.  PHYSICAL EXAMINATION:  ECOG PERFORMANCE STATUS: 3 -  Symptomatic, >50% confined to bed  Vitals:   01/10/17 1310  BP: (!) 103/49  Pulse: 61  Resp: 18  Temp: 98.2 F (36.8 C)  SpO2: 97%   Filed Weights   01/10/17 1310  Weight: 185 lb 12.8 oz (84.3 kg)    GENERAL: Drowsy, sitting in wheelchair, wrapped with blankets SKIN: skin color, texture, turgor are normal, no rashes or significant lesions EYES: normal, conjunctiva are pink and non-injected, sclera clear OROPHARYNX:no exudate, no erythema and lips, buccal mucosa, and tongue normal  NECK: supple, thyroid normal size, non-tender, without nodularity LYMPH:  no palpable lymphadenopathy in the cervical, axillary or inguinal LUNGS: clear to auscultation and percussion with normal breathing effort HEART: regular rate & rhythm and no murmurs and no lower extremity edema ABDOMEN: tenderness to epigastrium, but overall abdomen is soft. No organomegaly, bowel sounds normal. Musculoskeletal: no cyanosis of digits and no clubbing. Bilateral symmetric trace edema to BLE.  PSYCH: alert & oriented x 3 with fluent speech NEURO: no focal motor/sensory deficits   LABORATORY DATA:  I have reviewed the data as listed CBC Latest Ref Rng & Units 12/27/2016 11/14/2016 11/10/2016  WBC 4.0 - 10.5 K/uL 8.8 8.5 8.8  Hemoglobin 12.0 - 15.0 g/dL 9.4(L) 8.0(L) 9.9(L)  Hematocrit 36.0 - 46.0 % 29.8(L) 24.1(L) 31.3(L)  Platelets 150 - 400 K/uL 338 221 381   CMP Latest Ref Rng & Units 12/27/2016 11/24/2016 11/14/2016  Glucose 65 - 99 mg/dL 120(H) 179(H) 124(H)  BUN 6 - 20 mg/dL 11 32(H) 25(H)  Creatinine 0.44 - 1.00 mg/dL 0.64 1.29(H) 1.08(H)  Sodium 135 - 145 mmol/L 138 132(L) 136  Potassium 3.5 - 5.1 mmol/L 3.7 3.9 2.9(L)  Chloride 101 - 111 mmol/L 107 93(L) 105  CO2 22 -  32 mmol/L _0 Calcium 8.9 - 10.3 mg/dL 8.5(L) 8.8(L) 6.6(L)  Total Protein 6.5 - 8.1 g/dL 6.2(L) - 5.2(L)  Total Bilirubin 0.3 - 1.2 mg/dL 0.6 - 0.7  Alkaline Phos 38 - 126 U/L 161(H) - 280(H)  AST 15 - 41 U/L 15 - 13(L)  ALT 14 - 54 U/L 10(L) - 11(L)     RADIOGRAPHIC STUDIES:  CT Angio Chest PE W or WO Contrast, 12/08/2016 IMPRESSION: No evidence of pulmonary embolism. Thickening of the pulmonic valve ; if there is clinical concern for valvular disease consider echocardiography. Mild dilatation of the main pulmonary artery which may reflect pulmonary artery hypertension. Atherosclerotic calcifications aorta and coronary arteries. Aortic Atherosclerosis (ICD10-I70.0).  CT Chest WO Contrast 05/07/16 Carlean Purl IMPRESSION: 1. New small right and trace left pleural effusions. Mild cardiomegaly. 2. Borderline enlarged right hilar lymph node at 1 cm in short axis. 3. No definite findings of metastatic disease to the chest. 4. Centrilobular emphysema. 5. High density in the visualized part of the dorsal pancreatic duct, probably contrast medium. 6. Subacute healing fracture of the right seventh rib laterally. 7. Atherosclerosis, including the left anterior descending coronary artery.  DG ERCP 05/06/16 Carlean Purl IMPRESSION: Imaging demonstrating removal of endoscopic common bile duct stent. The stent was not able to be replaced.   CT Abd, Head, Pelvis 05/05/16 IMPRESSION: Interval placement of a bile duct stent with some decompression of the bile ducts. Mild residual biliary dilatation. Pancreatic ductal dilatation and atrophy. Known pancreatic mass lesion is not well depicted at CT. No evidence of bowel obstruction or inflammation.  I have personally reviewed the radiological images as listed and agreed with the findings in the report. Ct Abdomen Pelvis W Contrast  Result Date: 12/27/2016 CLINICAL DATA:  Nausea, vomiting and diarrhea for 2 days. History of pancreatic cancer  treated with chemotherapy. EXAM: CT ABDOMEN AND PELVIS WITH CONTRAST TECHNIQUE: Multidetector CT imaging of the abdomen and pelvis was performed using the standard protocol following bolus administration of intravenous contrast. CONTRAST:  100 cc Isovue 300 intravenously. COMPARISON:  11/10/2016 FINDINGS: Lower chest: Mitral valve calcifications versus prosthesis. Small hiatal hernia. Hepatobiliary: Postcholecystectomy. No evidence of hepatic masses. Pneumobilia. Pancreas: Known hypoattenuated ill-defined mass in the head of the pancreas is relatively stable in size measuring 3.0 x 3.1 cm. Extensive wispy peripancreatic fat stranding. The pancreatic mass is inseparable from the duodenum. Increased soft tissue thickening along the superior mesenteric vein and main portal vein likely represents tumor extension superiorly. It completely encases the extrahepatic common bile duct containing metallic stent. Spleen: Scatter hypoattenuated lesions throughout the spleen. Adrenals/Urinary Tract: Adrenal glands are unremarkable. Kidneys are without renal calculi, focal lesion, or hydronephrosis. Bilateral renal cysts. Bladder is unremarkable. Stomach/Bowel: No evidence of small-bowel obstruction. The colon is normal. Vascular/Lymphatic: Shotty retroperitoneal lymph nodes in the upper abdomen. Reproductive: Uterus and bilateral adnexa are unremarkable. Other: No abdominal wall hernia or abnormality. No abdominopelvic ascites. Musculoskeletal: No evidence of suspicious osseous lesions. IMPRESSION: Interval extension of the known pancreatic head mass superiorly consistent with local progression of disease. Metallic common bile duct stent in stable position with expected pneumobilia. No evidence of small-bowel obstruction. Electronically Signed   By: Fidela Salisbury M.D.   On: 12/27/2016 19:35     ASSESSMENT & PLAN: 71 y.o.female with PMH of IDDM with neuropathy, HFpEF, HTN, OSA on intermittent home oxygen, and recently  diagnosed pancreatic mass s/p biliary stent 04/28/2016 who was recently treated for acute cholangitis, status post cutaneous biliary drainage. She had biliary drainage tube exchange with peri-stent placement by IR Dr. Laurence Ferrari today  1. Pancreatic adenocarcinoma, probably resectable  --I previously reviewed her CT scan findings, her pancreatic cancer is probably resectable, no evidence of distant metastasis on CT scans. She was evaluated by general surgeon Dr. Barry Dienes. However she may not be a good candidate due to her advanced age, comorbidities, malnutrition and a very poor performance status. -We previously reviewed the natural history of pancreatic cancer, and a very high risk of recurrence after surgical resection. Most people requires neoadjuvant or adjuvant chemotherapy and possible radiation -We previously have discussed her case in our she had tumor board. Dr. Langston Reusing did not think she was a candidate for whipple surgery. I recommended RT.  -She completed SBRT with Dr. Lisbeth Renshaw -She had chemotherapy consistent of gemcitabine and abraxine for 2-3 months under the care of Dr. Berneice Gandy at [facility] with her last treatment being over 2 months ago.  -Patient was not a candidate for surgery via Dr. Mariah Milling at [facility]  -I reviewed the natural course of pancreatic cancer, and her disease is not curable since her surgery is not an option.  The goal of therapy is to prolong her life and preserve her quality of life.  Discussed with patient and her friend regarding continuing chemotherapy vs supportive care alone. Pt would like to discuss this with her children and then follow up with an answer in 3 weeks at next appointment -Lab, flush, C. Diff labs today -Lab, flush and f/u in 3 weeks   2. Diarrhea, possibly related to pancreatic insufficiency  -Rule out C. Diff with lab today. She stool was loose but not watery, and c-diff PCR was not to perform.  No increased white count,  less likely this is infectious. -Encouraged  her to continue Creon -will treat patient diarrhea with lomotil Rx.   3. IDDM, morbid obesity -follow up with PCP  4. Chronic diastolic heart failure -follow up with PCP  5. Malnutrition and deconditioning -I previously encouraged her to continue physical therapy at his rehabilitation  6. Goal of care discussion  -We again discussed the incurable nature of her cancer, and the overall poor prognosis, especially if she does not have good response to chemotherapy or progress on chemo -The patient understands the goal of care is palliative. -I recommend DNR/DNI, she will think about it    Recommendations: -Lab, flush today -Lab, flush and f/u in 3 weeks -Pt will think about chemo and discuss with her children  -refilled her oxycodone, zofran, mirtazapine (increased to 59m), and lomitil today    Orders Placed This Encounter  Procedures  . Fecal, C-Dff PCR    Standing Status:   Future    Standing Expiration Date:   01/10/2018    Order Specific Question:   Is your patient experiencing loose or watery stools (3 or more in 24 hours)?    Answer:   Yes    Order Specific Question:   Has the patient received laxatives in the last 24 hours?    Answer:   No    Order Specific Question:   Has a negative Cdiff test resulted in the last 7 days?    Answer:   No  . Magnesium    Standing Status:   Future    Standing Expiration Date:   01/10/2018    All questions were answered. The patient knows to call the clinic with any problems, questions or concerns. I spent 30 minutes counseling the patient face to face. The total time spent in the appointment was 40 minutes and more than 50% was on counseling.  This document serves as a record of services personally performed by YTruitt Merle MD. It was created on her behalf by SSteva Colder a trained medical scribe. The creation of this record is based on the scribe's personal observations and the provider's statements to them.   I have reviewed the  above documentation for accuracy and completeness, and I agree with the above.    FTruitt Merle MD 01/10/2017

## 2017-01-09 NOTE — Telephone Encounter (Signed)
Called stating the patients BS was 45, Patient was very goggy, no other symptoms, felt better after BS got back up The last few mornings her readings have been 63, 70,  91,  and 100 She has 500 metofrmin daily and sliding scale insulin and 30units of lantus at bedtime  Their Dr has recommened to hold Latus at bedtime to see if that helps  Her disease has progressed, she has lost weight and is not eating, she eats speraticully , but is not eating much the weight loss is progressive, lost 70 pounds this year and has lost more with the pancreatic cancer  Their nurse will be going out on wednesday and will review BS readings Last night her BS was 204 before she took her LANTUS   Please advise on stopping LANTUS

## 2017-01-10 ENCOUNTER — Encounter: Payer: Self-pay | Admitting: Hematology

## 2017-01-10 ENCOUNTER — Telehealth: Payer: Self-pay

## 2017-01-10 ENCOUNTER — Ambulatory Visit (HOSPITAL_BASED_OUTPATIENT_CLINIC_OR_DEPARTMENT_OTHER): Payer: PPO | Admitting: Hematology

## 2017-01-10 VITALS — BP 103/49 | HR 61 | Temp 98.2°F | Resp 18 | Ht 62.0 in | Wt 185.8 lb

## 2017-01-10 DIAGNOSIS — N183 Chronic kidney disease, stage 3 unspecified: Secondary | ICD-10-CM

## 2017-01-10 DIAGNOSIS — IMO0001 Reserved for inherently not codable concepts without codable children: Secondary | ICD-10-CM

## 2017-01-10 DIAGNOSIS — M545 Low back pain: Secondary | ICD-10-CM

## 2017-01-10 DIAGNOSIS — Z9981 Dependence on supplemental oxygen: Secondary | ICD-10-CM | POA: Diagnosis not present

## 2017-01-10 DIAGNOSIS — Z794 Long term (current) use of insulin: Secondary | ICD-10-CM

## 2017-01-10 DIAGNOSIS — I10 Essential (primary) hypertension: Secondary | ICD-10-CM

## 2017-01-10 DIAGNOSIS — Z7189 Other specified counseling: Secondary | ICD-10-CM

## 2017-01-10 DIAGNOSIS — R197 Diarrhea, unspecified: Secondary | ICD-10-CM

## 2017-01-10 DIAGNOSIS — E119 Type 2 diabetes mellitus without complications: Secondary | ICD-10-CM

## 2017-01-10 DIAGNOSIS — R630 Anorexia: Secondary | ICD-10-CM | POA: Diagnosis not present

## 2017-01-10 DIAGNOSIS — R109 Unspecified abdominal pain: Secondary | ICD-10-CM | POA: Diagnosis not present

## 2017-01-10 DIAGNOSIS — I5032 Chronic diastolic (congestive) heart failure: Secondary | ICD-10-CM

## 2017-01-10 DIAGNOSIS — C250 Malignant neoplasm of head of pancreas: Secondary | ICD-10-CM

## 2017-01-10 MED ORDER — SODIUM CHLORIDE 0.9 % IJ SOLN
10.0000 mL | Freq: Once | INTRAMUSCULAR | Status: AC
  Administered 2017-01-10: 10 mL
  Filled 2017-01-10: qty 10

## 2017-01-10 MED ORDER — MIRTAZAPINE 30 MG PO TABS: 30.0000 mg | ORAL_TABLET | Freq: Every day | ORAL | 2 refills | Status: DC

## 2017-01-10 MED ORDER — HEPARIN SOD (PORK) LOCK FLUSH 100 UNIT/ML IV SOLN
500.0000 [IU] | Freq: Once | INTRAVENOUS | Status: AC
  Administered 2017-01-10: 500 [IU]
  Filled 2017-01-10: qty 5

## 2017-01-10 MED ORDER — DIPHENOXYLATE-ATROPINE 2.5-0.025 MG PO TABS: 1.0000 | ORAL_TABLET | Freq: Four times a day (QID) | ORAL | 2 refills | Status: DC | PRN

## 2017-01-10 MED ORDER — OXYCODONE HCL 5 MG PO TABS: 5.0000 mg | ORAL_TABLET | Freq: Two times a day (BID) | ORAL | 0 refills | Status: DC | PRN

## 2017-01-10 MED ORDER — ONDANSETRON HCL 8 MG PO TABS: 8.0000 mg | ORAL_TABLET | Freq: Three times a day (TID) | ORAL | 2 refills | Status: AC | PRN

## 2017-01-10 NOTE — Telephone Encounter (Signed)
LVM for [facility] at [facility] informing of MD response

## 2017-01-10 NOTE — Telephone Encounter (Signed)
Printed patient avs and calender for upcoming appointment. Per 11/20 los

## 2017-01-10 NOTE — Telephone Encounter (Signed)
Would recommend decrease lantus to 10 units at night.

## 2017-01-11 ENCOUNTER — Telehealth: Payer: Self-pay

## 2017-01-11 ENCOUNTER — Other Ambulatory Visit: Payer: Self-pay | Admitting: Hematology

## 2017-01-11 NOTE — Telephone Encounter (Signed)
PA approved from 01/05/2017-02/20/2018

## 2017-01-16 ENCOUNTER — Telehealth: Payer: Self-pay | Admitting: Emergency Medicine

## 2017-01-16 NOTE — Telephone Encounter (Signed)
Lost to followup 

## 2017-01-17 ENCOUNTER — Telehealth: Payer: Self-pay | Admitting: Internal Medicine

## 2017-01-17 NOTE — Telephone Encounter (Signed)
Okay 

## 2017-01-17 NOTE — Telephone Encounter (Signed)
Called stating that Dr. Sharlet Salina had taken patient off of sertraline and started patient on mirtazapine.  States that this did not get updated and that patient had not followed instructions.  States Dr. Morey Hummingbird increased patients dose of sertraline.  States that they are now going to taper patient off of sertraline and taper patient onto mirtazapine.

## 2017-01-17 NOTE — Telephone Encounter (Signed)
Notified nurse w/MD response.../lmb 

## 2017-01-18 NOTE — Patient Outreach (Signed)
[facility]) Care Management  01/18/2017  KATHERYN CULLITON 1946-01-28 [id number]  Documentation correction completed on 01/18/17.  Eula Fried, BSW, MSW, [email or web address] Phone: [phone] Fax: [phone]

## 2017-01-24 ENCOUNTER — Telehealth: Payer: Self-pay | Admitting: *Deleted

## 2017-01-24 ENCOUNTER — Other Ambulatory Visit (HOSPITAL_COMMUNITY): Payer: Self-pay | Admitting: Internal Medicine

## 2017-01-24 NOTE — Telephone Encounter (Signed)
Received vm call from Tracy/RN/[facility] req call back regarding increased edema in extremeties.  Returned call & she states that pt has some peripheral edema, 1 + pitting.  Pt has known CHF & weight was 190 today.  She is on spironolactone & has lasix prn.  She has restless legs & med list reports requip 4 mg hs but order received to change to 2 mg TID from [facility] & this is working for her.  She wants to know if Dr Burr Medico would OK order for this dose.  Pt is diabetic & BS was 470 but pt had candy for breakfast.  Informed that Dr Burr Medico out of office & per her note diabetes & CHF should be referred to PCP but will leave message for Dr Burr Medico.  Call back # is [phone].

## 2017-01-24 NOTE — Telephone Encounter (Signed)
I will defer her PCP Dr. Sharlet Salina to manage her DM and CHF. She is copied here.   Truitt Merle MD

## 2017-01-26 NOTE — Telephone Encounter (Signed)
Okay to change requip to 2 mg TID. No change to fluid pill dosing.

## 2017-01-26 NOTE — Telephone Encounter (Signed)
Call to see if she is having SOB or leg swelling with weight gain.

## 2017-01-26 NOTE — Telephone Encounter (Signed)
Melissa Myers states that she did not C/O SOB on Tuesday when she was there but did have the leg swelling. She Weighed  190 Tuesday last time we saw her she was 185. Melissa Myers states she has been more active than she has been in past months. Wants to know if she should just monitor her or have her start the torsemide prn. Also inquiring about the requip if they can change it to 2mg  TID for her restless leg.

## 2017-01-27 NOTE — Telephone Encounter (Signed)
LVM for tracy informing of MD response

## 2017-01-31 ENCOUNTER — Other Ambulatory Visit: Payer: Self-pay | Admitting: Internal Medicine

## 2017-01-31 NOTE — Progress Notes (Signed)
Melissa Myers  [phone] [phone]  Clinic Follow Up Note   Patient Care Team: [name], MD as PCP - General (Internal Medicine) Rigoberto Noel, MD as Consulting Physician (Pulmonary Disease) Larey Dresser, MD as Consulting Physician (Cardiology) Megan Salon, MD as Consulting Physician (Gynecology) Kristeen Miss, MD as Consulting Physician (Neurosurgery) Earlie Server, MD (Orthopedic Surgery) Almedia Balls, MD (Orthopedic Surgery) Lafayette Dragon, MD (Inactive) (Gastroenterology) Renato Shin, MD (Endocrinology) Darleen Crocker, MD (Ophthalmology)   Date of Service: 02/01/2017  CHIEF COMPLAINTS:  Follow up pancreatic Cancer   Oncology History   Cancer Staging Malignant neoplasm of head of pancreas [facility]) Staging form: Exocrine Pancreas, AJCC 8th Edition - Clinical stage from 04/28/2016: Stage IB (cT2, cN0, cM0) - Signed by Truitt Merle, MD on 02/01/2017       Malignant neoplasm of head of pancreas ([place])   02/2016 Initial Diagnosis    Malignant neoplasm of head of pancreas ([place])      03/15/2016 - 03/21/2016 Hospital Admission    Presents with nausea, vomiting, and jaundice   Patient was found hyperbilirubinemic with abnormal abdominal CT suggesting obstructive physiology. Referred for admission and evaluation.      03/15/2016 Imaging    CT Abdomen Pelvis w Contrast 1. Cholecystectomy with borderline biliary duct dilatation. 2. Pancreatic atrophy with moderate pancreatic duct dilatation, followed to the level of the duodenum. 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 Considerations include otherwise occult stricture, periampullary lesion, or main duct intraductal papillary mucinous neoplasm. Given the combination of borderline biliary duct dilatation, moderate pancreatic duct dilatation, and the clinical history of jaundice, consider further evaluation with ERCP. If the patient is not a good ERCP candidate, MRCP with and  without contrast would be another option. 3.  No acute process in the abdomen or pelvis. 4.  Tiny hiatal hernia. 5.  Aortic atherosclerosis.      03/16/2016 Imaging    MRI Abdomen 1. Hypoenhancing lesion in the uncinate process of the pancreas, better demonstrated by MR than on the recent CT scan. The dilated main pancreatic duct and dilated common bile duct abruptly terminate at the cranial margin of this lesion. MR imaging features are concerning for pancreatic adenocarcinoma. ERCP with endoscopic ultrasound would likely prove helpful to further evaluate. 2. No definite metastatic disease in the liver. No peripancreatic lymphadenopathy is identified. Of note, this exam is limited by marked motion degradation and small liver lesions could be obscured. CT scan earlier today demonstrated no peripancreatic lymphadenopathy.      03/18/2016 Procedure    ERCP 1. Malignant-appearing distal bile duct stricturestatus post ERCP with sphincterotomy and plastic biliary stent placement 2. Guidewire finding false lumen with subsequentinjection as described 3. Status post cholecystectomy  Recommendation: 1. Clear liquid diet 2. Unasyn 1.5 g every 6 hours 3. Standard post ERCP observation. Indomethacin suppositories given 4. Trend LFTs 5. Will need endoscopic ultrasound with biopsy with Dr. Ardis Hughs outpatient.      04/28/2016 Pathology Results    Endoscopic [place] and Biopsy FINE NEEDLE ASPIRATION, ENDOSCOPIC, PANCREAS UNCINATE (SPECIMEN 1 OF 1 COLLECTED 04/28/16): MALIGNANT CELLS CONSISTENT WITH ADENOCARCINOMA.      05/04/2016 -  Hospital Admission    Presents with fever, abdominal pain, malaise, confusion. She was treated for acute cholangitis.       05/05/2016 Imaging    CT Abdomen Pelvis w Contrast Interval placement of a bile duct stent with some decompression of the bile ducts. Mild residual biliary dilatation. Pancreatic ductal dilatation and atrophy. Known  pancreatic mass lesion is  not well depicted at CT. No evidence of bowel obstruction or inflammation.      05/05/2016 Imaging    CT Head w/o Contrast 05/05/16 IMPRESSION: No acute intracranial pathology.      05/06/2016 Procedure    ERCP and Bilary stent placement  Imaging demonstrating removal of endoscopic common bile duct stent. The stent was not able to be replaced.       05/07/2016 Imaging    CT Chest wo Contrast  1. New small right and trace left pleural effusions. Mild cardiomegaly. 2. Borderline enlarged right hilar lymph node at 1 cm in short axis. 3. No definite findings of metastatic disease to the chest. 4. Centrilobular emphysema. 5. High density in the visualized part of the dorsal pancreatic duct, probably contrast medium. 6. Subacute healing fracture of the right seventh rib laterally. 7. Atherosclerosis, including the left anterior descending coronary artery.      05/07/2016 Procedure    On 05/07/16, percutaneous transhepatic cholangiogram and placement of a 86F internal/external biliary drain was performed by IR with aspirated purulent bile sent for culture. Bile culture grew pantoea species, enterococcus faecalis, viridans streptococcus.      07/11/2016 - 07/21/2016 Radiation Therapy    Diagnosis:   cT2N0M0  adenocarcinoma of the pancreas  Indication for treatment:  Curative  Radiation treatment dates:   07/11/16 - 07/21/16  Site/dose:   Pancreas: 33 Gy in 5 fractions  Beams/energy:   SBRT/SRT-VMAT // 10X-FFF Photon  Narrative: The patient tolerated radiation treatment relatively well. The patient complained of diarrhea during treatment. She was instructed to purchase Imodium.      08/2016 - 10/2016 Chemotherapy    She had chemotherapy consistent of gemcitabine and abraxine for 2-3 months under the care of Dr. Berneice Gandy at [facility] with her last treatment being about 10/2016.         Chemotherapy    Single agent Gemcitabine every 2-3 weeks on and 1 week off, starting week of the new  year        HISTORY OF PRESENTING ILLNESS (05/19/2016):  Melissa Myers 71 y.o. female is here because the patient presented to the ED on 03/15/16 due to weakness, hyperglycemia, jaundice, abdominal pain, mild confusion, and chronic diarrhea. Physical exam at the time revealed abdominal distension and diffuse tenderness. Labs found the patient hyperbilirubinemic. CT scan of the abdomen showed pancreatic atrophy with moderate pancreatic duct dilatation, followed to the level of the duodenum. 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 Considerations include otherwise occult stricture, periampullary lesion, or main duct intraductal papillary mucinous neoplasm. MRI of the abdomen the same day showed a 2.2 x 2.8 cm hypoenhancing lesion in the uncinate process of the pancreas, better demonstrated by MRI than on the recent CT scan. The dilated main pancreatic duct and dilated common bile duct abruptly terminate at the cranial margin of this lesion. MRI features are concerning for pancreatic adenocarcinoma.CA 19-9 on 03/17/16 was 107; elevated.  The patient underwent ERCP, with biliary sphincterotomy and biliary stent placement by Dr. Henrene Pastor 03/18/16. The patient was subsequently discharged on 03/21/16.  On 03/27/16, the patient presented to the ED on 03/29/16 for flu like symptoms. She was Influenza A positive. She was given fluids and discharged with Tamiflu. She presented back to the ED the following day for worsening symptoms. She was started on antibiotics for possible developing pneumonia giving a productive cough.  Given enough time to recover from her hospitalization and the flu, the patient underwent biopsy of the pancreatic  mass by Dr. Ardis Hughs on 04/28/16 revealing adenocarcinoma.  Since then, the patient presented to the ED on 05/04/16 for a headaches, hyperglycemia, confusion, abdominal pain, and nausea. Given presentation of fever/confusion, she was admitted to the hospital.  CT of the head on 05/05/16 was  negative. CT of the abd/pelvis on 05/05/16 showed interval placement of a bile duct stent with some decompression of the bile ducts, mild residual biliary dilatation, and pancreatic ductal dilatation and atrophy.  She was diagnosed with cholangitis and recurrent jaundice. Zosyn was started. ERCP on 05/06/16 showed a clogged stent which was removed, though a new stent was not able to be placed across a malignant biliary stricture. On 05/07/16, percutaneous transhepatic cholangiogram and placement of a 52F internal/external biliary drain was performed by IR with aspirated purulent bile sent for culture. Blood cultures remained negative, though bile culture grew pantoea species, enterococcus faecalis, viridans streptococcus.Urine culture had also grown Klebsiella pneumoniae, sensitive to Zosyn.  I see the patient today in an inpatient setting to discuss treatment options for the management of her pancreatic adenocarcinoma. I met patient, her husband and son in her room. According to patient's family, she used to be independent, does self care and housework, walks with a walker. Due to fatigue, anorexia, she has not been able to perform her daily activities in the past 4-6 weeks, and has spent most time in bed and chair for the past few weeks.    CURRENT THERAPY:  -Changed to single agent gemcitabine 2-3 weeks on and 1 week off starting in 3 weeks.    INTERIM HISTORY:  Niema returns for follow-up. She was accompanied by her Husband.  She notes she has terrible diarrhea, a 8/10. She notes she goes several times a day. If she eats anything within 5 minutes she will have stomach discomfort and then 5 minutes later she will have diarrhea. She takes creon before meals. She denies fever. She takes lomotil but is not sure how much she is taking. She can use up to 8 tablets a day, I suggest titrate up if she can tolerate it. She will need a refill. She will also take imodium as needed. She notes the home aid helps  with her medication and refills.  She notes arthritis and neuropathy pain and occasional abdominal pain. Her nausea has resolved. She reports she can do everything she can but will move slower. Her husband notes she is more alert and active. Her family members have encouraged her to continue treatment as they would like her to live longer. She would like to proceed with chemotherapy.      MEDICAL HISTORY:  Past Medical History:  Diagnosis Date  . Anemia   . Cancer [facility])    Pancreatic  . Cellulitis    LOWER EXTREMITIES  . Chronic diarrhea    a/w nausea - felt related to IBS  . Deaf    left side only  . Diastolic CHF ([place])   . Disc degeneration, lumbar   . GERD (gastroesophageal reflux disease)   . Hyperlipidemia    hx rhabdo on statins  . Hypertension   . Neuropathy    feet, toes and fingers  . On home oxygen therapy    uses oxygen 2 liters min per [place] at night and prn during day  . OSA (obstructive sleep apnea)    05/2009 sleep study - refuses CPAP  . Osteoarthritis   . RLS (restless legs syndrome)   . Shortness of breath    chronic  .  Stasis dermatitis   . Type II or unspecified type diabetes mellitus without mention of complication, not stated as uncontrolled    insulin dep    SURGICAL HISTORY: Past Surgical History:  Procedure Laterality Date  . BILIARY STENT PLACEMENT N/A 05/06/2016   Procedure: BILIARY STENT PLACEMENT;  Surgeon: Gatha Mayer, MD;  Location: [place];  Service: Endoscopy;  Laterality: N/A;  . CHOLECYSTECTOMY  1997  . COLONOSCOPY N/A 12/03/2012   Procedure: COLONOSCOPY;  Surgeon: Lafayette Dragon, MD;  Location: [facility];  Service: Endoscopy;  Laterality: N/A;  . ENDOSCOPIC RETROGRADE CHOLANGIOPANCREATOGRAPHY (ERCP) WITH PROPOFOL N/A 05/06/2016   Procedure: ENDOSCOPIC RETROGRADE CHOLANGIOPANCREATOGRAPHY (ERCP) WITH PROPOFOL;  Surgeon: Gatha Mayer, MD;  Location: [place];  Service: Endoscopy;  Laterality: N/A;  . ERCP N/A 03/18/2016    Procedure: ENDOSCOPIC RETROGRADE CHOLANGIOPANCREATOGRAPHY (ERCP);  Surgeon: Irene Shipper, MD;  Location: [facility];  Service: Endoscopy;  Laterality: N/A;  . EUS N/A 04/28/2016   Procedure: UPPER ENDOSCOPIC ULTRASOUND (EUS) LINEAR;  Surgeon: [name], MD;  Location: [facility];  Service: Endoscopy;  Laterality: N/A;  . IR CHOLANGIOGRAM EXISTING TUBE  06/09/2016  . IR GENERIC HISTORICAL  05/07/2016   IR BILIARY DRAIN PLACEMENT WITH CHOLANGIOGRAM 05/07/2016 Jacqulynn Cadet, MD [facility]  . IR GENERIC HISTORICAL  05/19/2016   IR BILIARY STENT(S) EXISTING ACCESS INC DILATION CATH EXCHANGE 05/19/2016 Jacqulynn Cadet, MD [facility]  . TONSILLECTOMY  1970  . TUBAL LIGATION  1980  . UMBILICAL HERNIA REPAIR  1995  . uterine polyp removal  2008    SOCIAL HISTORY: Social History   Socioeconomic History  . Marital status: Married    Spouse name: Not on file  . Number of children: Not on file  . Years of education: Not on file  . Highest education level: Not on file  Social Needs  . Financial resource strain: Not on file  . Food insecurity - worry: Not on file  . Food insecurity - inability: Not on file  . Transportation needs - medical: Not on file  . Transportation needs - non-medical: Not on file  Occupational History  . Not on file  Tobacco Use  . Smoking status: Never Smoker  . Smokeless tobacco: Never Used  Substance and Sexual Activity  . Alcohol use: No  . Drug use: No  . Sexual activity: No  Other Topics Concern  . Not on file  Social History Narrative   Lives with spouse and mother. Retired Network engineer, now housewife    FAMILY HISTORY: Family History  Problem Relation Age of Onset  . Heart disease Mother   . Heart attack Mother 26  . Heart disease Father   . Heart attack Father 41  . Heart disease Unknown        family history  . Stomach cancer Paternal Grandmother   . Lung cancer Paternal Grandfather   . CVA Unknown        several aunts  . Heart  attack Unknown        several aunts and an uncle  . Colon cancer Neg Hx     ALLERGIES:  is allergic to morphine; statins; sulfa antibiotics; cymbalta [duloxetine hcl]; sulfasalazine; levemir [insulin detemir]; and zinc.  MEDICATIONS:  Current Outpatient Medications  Medication Sig Dispense Refill  . aspirin EC 81 MG tablet Take 1 tablet (81 mg total) by mouth daily.    . carvedilol (COREG) 25 MG tablet TAKE ONE TABLET TWICE DAILY WITH FOOD 60 tablet 5  .  Cholecalciferol (VITAMIN D3) 2000 units capsule Take 1 capsule (2,000 Units total) by mouth daily. 90 capsule 3  . Continuous Blood Gluc Sensor (FREESTYLE LIBRE SENSOR SYSTEM) MISC Use to check sugars 1 each 0  . diphenoxylate-atropine (LOMOTIL) 2.5-0.025 MG tablet Take 1-2 tablets by mouth 4 (four) times daily as needed for diarrhea or loose stools. 120 tablet 2  . gabapentin (NEURONTIN) 300 MG capsule TAKE TWO CAPSULES FOUR TIMES DAILY 240 capsule 1  . hydrALAZINE (APRESOLINE) 25 MG tablet TAKE 1 TABLET (25 MG TOTAL) BY MOUTH 3 (THREE) TIMES DAILY. 90 tablet 3  . insulin aspart (NOVOLOG) 100 UNIT/ML injection Inject 0-20 Units into the skin 3 (three) times daily with meals. . CBG 70 - 120: 0 units CBG 121 - 150: 3 units CBG 151 - 200: 4 units CBG 201 - 250: 7 units CBG 251 - 300: 11 units CBG 301 - 350: 15 units CBG 351 - 400: 20 units    . insulin glargine (LANTUS) 100 UNIT/ML injection Inject 0.3 mLs (30 Units total) into the skin daily.    [name] lidocaine (LIDODERM) 5 % Place 1 patch daily onto the skin. Remove & Discard patch within 12 hours or as directed by MD 30 patch 0  . lidocaine-prilocaine (EMLA) cream Apply to port site prior to being accessed    . lipase/protease/amylase (CREON) 36000 UNITS CPEP capsule 2 po prior to snacks; 4 po prior to meals    . meclizine (ANTIVERT) 25 MG tablet Take 25 mg 2 (two) times daily as needed by mouth for dizziness.     . metFORMIN (GLUCOPHAGE) 500 MG tablet Take 500 mg by mouth daily with  breakfast.    . mirtazapine (REMERON) 30 MG tablet Take 1 tablet (30 mg total) by mouth at bedtime. 30 tablet 2  . ONETOUCH VERIO test strip USE AS INSTRUCTED UP TO 4 TIMES DAILY 200 each 11  . oxyCODONE (OXY IR/ROXICODONE) 5 MG immediate release tablet Take 1 tablet (5 mg total) by mouth 2 (two) times daily as needed for severe pain. 60 tablet 0  . pregabalin (LYRICA) 75 MG capsule Take 1 capsule (75 mg total) daily by mouth. 30 capsule 3  . rOPINIRole (REQUIP XL) 4 MG 24 hr tablet TAKE ONE TABLET AT BEDTIME 30 tablet 5  . sertraline (ZOLOFT) 50 MG tablet Take 50 mg by mouth daily.    . silver sulfADIAZINE (SILVADENE) 1 % cream Apply 1 application daily topically. 50 g 0  . spironolactone (ALDACTONE) 25 MG tablet Take 1 tablet (25 mg total) by mouth daily. 90 tablet 3  . traMADol (ULTRAM) 50 MG tablet Take 1 tablet (50 mg total) by mouth every 6 (six) hours as needed. (Patient taking differently: Take 50 mg every 6 (six) hours as needed by mouth for moderate pain. ) 60 tablet 0  . lactulose (CHRONULAC) 10 GM/15ML solution Take 10 g by mouth daily as needed for mild constipation.    . ondansetron (ZOFRAN) 8 MG tablet Take 1 tablet (8 mg total) by mouth every 8 (eight) hours as needed for nausea or vomiting. (Patient not taking: Reported on 02/01/2017) 30 tablet 2   No current facility-administered medications for this visit.     REVIEW OF SYSTEMS:   Constitutional: (+) increased energy for activities Eyes: Denies blurriness of vision, double vision or watery eyes Ears, nose, mouth, throat, and face: Denies mucositis or sore throat Respiratory: Denies cough, dyspnea or wheezes Cardiovascular: Denies palpitation, chest discomfort or lower extremity swelling Gastrointestinal:  (+)  occasional abdominal pain and cramping (+) significant diarrhea 8/10 Skin: Denies abnormal skin rashes Lymphatics: Denies new lymphadenopathy or easy bruising MSK: (+) arthritis  Neurological:(+)  neuropathy Behavioral/Psych: Mood is stable, no new changes  All other systems were reviewed with the patient and are negative.  PHYSICAL EXAMINATION:  ECOG PERFORMANCE STATUS: 3 - Symptomatic, >50% confined to bed  Vitals:   02/01/17 1525  BP: (!) 111/46  Pulse: 73  Resp: (!) 24  Temp: 98.4 F (36.9 C)  SpO2: 100%   Filed Weights   02/01/17 1525  Weight: 193 lb 9.6 oz (87.8 kg)    GENERAL: Drowsy, sitting in wheelchair, wrapped with blankets SKIN: skin color, texture, turgor are normal, no rashes or significant lesions EYES: normal, conjunctiva are pink and non-injected, sclera clear OROPHARYNX:no exudate, no erythema and lips, buccal mucosa, and tongue normal  NECK: supple, thyroid normal size, non-tender, without nodularity LYMPH:  no palpable lymphadenopathy in the cervical, axillary or inguinal LUNGS: clear to auscultation and percussion with normal breathing effort HEART: regular rate & rhythm and no murmurs and no lower extremity edema ABDOMEN: tenderness to epigastrium, but overall abdomen is soft. No organomegaly, bowel sounds normal. Musculoskeletal: no cyanosis of digits and no clubbing. Bilateral symmetric trace edema to BLE.  PSYCH: alert & oriented x 3 with fluent speech NEURO: no focal motor/sensory deficits   LABORATORY DATA:  I have reviewed the data as listed CBC Latest Ref Rng & Units 02/01/2017 12/27/2016 11/14/2016  WBC 3.9 - 10.3 10e3/uL 9.6 8.8 8.5  Hemoglobin 11.6 - 15.9 g/dL 9.7(L) 9.4(L) 8.0(L)  Hematocrit 34.8 - 46.6 % 31.5(L) 29.8(L) 24.1(L)  Platelets 145 - 400 10e3/uL 301 338 221   CMP Latest Ref Rng & Units 02/01/2017 12/27/2016 11/24/2016  Glucose 70 - 140 mg/dl 161(H) 120(H) 179(H)  BUN 7.0 - 26.0 mg/dL 18.7 11 32(H)  Creatinine 0.6 - 1.1 mg/dL 1.0 0.64 1.29(H)  Sodium 136 - 145 mEq/L 142 138 132(L)  Potassium 3.5 - 5.1 mEq/L 3.4(L) 3.7 3.9  Chloride 101 - 111 mmol/L - 107 93(L)  CO2 22 - 29 mEq/L 26 22 26   Calcium 8.4 - 10.4 mg/dL 9.1  8.5(L) 8.8(L)  Total Protein 6.4 - 8.3 g/dL 6.7 6.2(L) -  Total Bilirubin 0.20 - 1.20 mg/dL 0.45 0.6 -  Alkaline Phos 40 - 150 U/L 186(H) 161(H) -  AST 5 - 34 U/L 23 15 -  ALT 0 - 55 U/L 14 10(L) -     RADIOGRAPHIC STUDIES:  CT Angio Chest PE W or WO Contrast, 12/08/2016 IMPRESSION: No evidence of pulmonary embolism. Thickening of the pulmonic valve ; if there is clinical concern for valvular disease consider echocardiography. Mild dilatation of the main pulmonary artery which may reflect pulmonary artery hypertension. Atherosclerotic calcifications aorta and coronary arteries. Aortic Atherosclerosis (ICD10-I70.0).  CT Chest WO Contrast 05/07/16 Carlean Purl IMPRESSION: 1. New small right and trace left pleural effusions. Mild cardiomegaly. 2. Borderline enlarged right hilar lymph node at 1 cm in short axis. 3. No definite findings of metastatic disease to the chest. 4. Centrilobular emphysema. 5. High density in the visualized part of the dorsal pancreatic duct, probably contrast medium. 6. Subacute healing fracture of the right seventh rib laterally. 7. Atherosclerosis, including the left anterior descending coronary artery.  DG ERCP 05/06/16 Carlean Purl IMPRESSION: Imaging demonstrating removal of endoscopic common bile duct stent. The stent was not able to be replaced.   CT Abd, Head, Pelvis 05/05/16 IMPRESSION: Interval placement of a bile duct stent  with some decompression of the bile ducts. Mild residual biliary dilatation. Pancreatic ductal dilatation and atrophy. Known pancreatic mass lesion is not well depicted at CT. No evidence of bowel obstruction or inflammation.  I have personally reviewed the radiological images as listed and agreed with the findings in the report. No results found.   ASSESSMENT & PLAN: 71 y.o.female with PMH of IDDM with neuropathy, HFpEF, HTN, OSA on intermittent home oxygen, and recently diagnosed pancreatic mass s/p biliary stent 04/28/2016 who  was recently treated for acute cholangitis, status post cutaneous biliary drainage. She had biliary drainage tube exchange with peri-stent placement by IR Dr. Laurence Ferrari today  1. Pancreatic adenocarcinoma, borderline resectable, cT2N0M0, stage IB --I previously reviewed her CT scan findings, her pancreatic cancer is probably resectable, no evidence of distant metastasis on CT scans. She was evaluated by general surgeon Dr. Barry Dienes. However she may not be a good candidate due to her advanced age, comorbidities, malnutrition and a very poor performance status. -We previously reviewed the natural history of pancreatic cancer, and a very high risk of recurrence after surgical resection. Most people requires neoadjuvant or adjuvant chemotherapy and possible radiation -We previously have discussed her case in our she had tumor board. Dr. Langston Reusing did not think she was a candidate for whipple surgery. I recommended RT.  -She completed Pancreatic SBRT with Dr. Lisbeth Renshaw 07/11/16 - 07/21/16. -She had chemotherapy consistent of gemcitabine and abraxane for 2-3 months under the care of Dr. Berneice Gandy at [facility] with her last treatment being about 10/2016.  Her restaging CT scan in September 2018 at [facility] showed stable disease. -Patient was not a candidate for surgery via Dr. Mariah Milling at [facility]  -I reviewed the natural course of pancreatic cancer, and her disease is not curable since her surgery is not an option. The goal of therapy is to prolong her life and preserve her quality of life.  Discussed with patient and her friend regarding continuing chemotherapy vs supportive care alone.  After lengthy discussion with patient and her husband, she chose to proceed with chemo. I suggest single agent gemcitabine 2 weeks on and 1 week off. She is agreeable. She will start in 3 weeks after the holiday -Will repeat CT scan in the next 1-2 weeks.  -Labs reviewed, ANC 6.7, Hg 9.8, Potassium 3.4 and alk phos is 186.  -I suggest she increase her  potassium to TID today or tomorrow and then proceed with BID. I strongly encouraged her to drink plenty of water.  -F/u in 3 weeks  2. Diarrhea, possibly related to pancreatic insufficiency  -Rule out C. Diff with lab today. She stool was loose but not watery, and c-diff PCR was not to perform.  No increased white count, less likely this is infectious. -Encouraged her to continue Creon -will treat patient diarrhea with lomotil Rx.  -Diarrhea has increased, I encouraged her to titrate up her lomotil up to 8 tablets a day with imodium as needed. I refilled lomotil today   3. IDDM, morbid obesity -follow up with PCP  4. Chronic diastolic heart failure -follow up with PCP  5. Malnutrition and deconditioning -I previously encouraged her to continue physical therapy at his rehabilitation  6. Goal of care discussion  -We again discussed the incurable nature of her cancer, and the overall poor prognosis, especially if she does not have good response to chemotherapy or progress on chemo -The patient understands the goal of care is palliative. -I recommend DNR/DNI, she will think about it  Recommendations: -Refill lomotil today  -Chemo weekly gemcitabine in 3 and 4 weeks  -F/u in 3 weeks with lab, flush and CT CAP a few days before OV    No orders of the defined types were placed in this encounter.   All questions were answered. The patient knows to call the clinic with any problems, questions or concerns. I spent 30 minutes counseling the patient face to face. The total time spent in the appointment was 40 minutes and more than 50% was on counseling.  This document serves as a record of services personally performed by Truitt Merle, MD. It was created on her behalf by Joslyn Devon, a trained medical scribe. The creation of this record is based on the scribe's personal observations and the provider's statements to them.    I have reviewed the above documentation for accuracy and  completeness, and I agree with the above.   Truitt Merle, MD 02/01/2017

## 2017-02-01 ENCOUNTER — Encounter: Payer: Self-pay | Admitting: Hematology

## 2017-02-01 ENCOUNTER — Other Ambulatory Visit (HOSPITAL_BASED_OUTPATIENT_CLINIC_OR_DEPARTMENT_OTHER): Payer: PPO

## 2017-02-01 ENCOUNTER — Telehealth: Payer: Self-pay | Admitting: Hematology

## 2017-02-01 ENCOUNTER — Ambulatory Visit: Payer: PPO

## 2017-02-01 ENCOUNTER — Telehealth: Payer: Self-pay | Admitting: Internal Medicine

## 2017-02-01 ENCOUNTER — Ambulatory Visit (HOSPITAL_BASED_OUTPATIENT_CLINIC_OR_DEPARTMENT_OTHER): Payer: PPO | Admitting: Hematology

## 2017-02-01 VITALS — BP 111/46 | HR 73 | Temp 98.4°F | Resp 24 | Ht 62.0 in | Wt 193.6 lb

## 2017-02-01 DIAGNOSIS — I5032 Chronic diastolic (congestive) heart failure: Secondary | ICD-10-CM | POA: Diagnosis not present

## 2017-02-01 DIAGNOSIS — C250 Malignant neoplasm of head of pancreas: Secondary | ICD-10-CM

## 2017-02-01 DIAGNOSIS — M1990 Unspecified osteoarthritis, unspecified site: Secondary | ICD-10-CM | POA: Diagnosis not present

## 2017-02-01 DIAGNOSIS — E46 Unspecified protein-calorie malnutrition: Secondary | ICD-10-CM

## 2017-02-01 DIAGNOSIS — R197 Diarrhea, unspecified: Secondary | ICD-10-CM | POA: Diagnosis not present

## 2017-02-01 DIAGNOSIS — E119 Type 2 diabetes mellitus without complications: Secondary | ICD-10-CM

## 2017-02-01 DIAGNOSIS — R52 Pain, unspecified: Secondary | ICD-10-CM

## 2017-02-01 DIAGNOSIS — IMO0001 Reserved for inherently not codable concepts without codable children: Secondary | ICD-10-CM

## 2017-02-01 DIAGNOSIS — Z794 Long term (current) use of insulin: Secondary | ICD-10-CM | POA: Diagnosis not present

## 2017-02-01 DIAGNOSIS — Z7189 Other specified counseling: Secondary | ICD-10-CM

## 2017-02-01 DIAGNOSIS — I10 Essential (primary) hypertension: Secondary | ICD-10-CM

## 2017-02-01 DIAGNOSIS — Z95828 Presence of other vascular implants and grafts: Secondary | ICD-10-CM

## 2017-02-01 LAB — CBC WITH DIFFERENTIAL/PLATELET
BASO%: 0.3 % (ref 0.0–2.0)
BASOS ABS: 0 10*3/uL (ref 0.0–0.1)
EOS ABS: 0.1 10*3/uL (ref 0.0–0.5)
EOS%: 1.4 % (ref 0.0–7.0)
HCT: 31.5 % — ABNORMAL LOW (ref 34.8–46.6)
HGB: 9.7 g/dL — ABNORMAL LOW (ref 11.6–15.9)
LYMPH%: 23.5 % (ref 14.0–49.7)
MCH: 24.1 pg — AB (ref 25.1–34.0)
MCHC: 30.8 g/dL — AB (ref 31.5–36.0)
MCV: 78.2 fL — AB (ref 79.5–101.0)
MONO#: 0.5 10*3/uL (ref 0.1–0.9)
MONO%: 5.5 % (ref 0.0–14.0)
NEUT#: 6.7 10*3/uL — ABNORMAL HIGH (ref 1.5–6.5)
NEUT%: 69.3 % (ref 38.4–76.8)
PLATELETS: 301 10*3/uL (ref 145–400)
RBC: 4.03 10*6/uL (ref 3.70–5.45)
RDW: 16.7 % — ABNORMAL HIGH (ref 11.2–14.5)
WBC: 9.6 10*3/uL (ref 3.9–10.3)
lymph#: 2.3 10*3/uL (ref 0.9–3.3)

## 2017-02-01 LAB — COMPREHENSIVE METABOLIC PANEL
ALT: 14 U/L (ref 0–55)
ANION GAP: 13 meq/L — AB (ref 3–11)
AST: 23 U/L (ref 5–34)
Albumin: 2.9 g/dL — ABNORMAL LOW (ref 3.5–5.0)
Alkaline Phosphatase: 186 U/L — ABNORMAL HIGH (ref 40–150)
BILIRUBIN TOTAL: 0.45 mg/dL (ref 0.20–1.20)
BUN: 18.7 mg/dL (ref 7.0–26.0)
CO2: 26 meq/L (ref 22–29)
Calcium: 9.1 mg/dL (ref 8.4–10.4)
Chloride: 103 mEq/L (ref 98–109)
Creatinine: 1 mg/dL (ref 0.6–1.1)
EGFR: 56 mL/min/{1.73_m2} — AB (ref >60)
Glucose: 161 mg/dl — ABNORMAL HIGH (ref 70–140)
POTASSIUM: 3.4 meq/L — AB (ref 3.5–5.1)
Sodium: 142 mEq/L (ref 136–145)
TOTAL PROTEIN: 6.7 g/dL (ref 6.4–8.3)

## 2017-02-01 LAB — MAGNESIUM: Magnesium: 1.6 mg/dl (ref 1.5–2.5)

## 2017-02-01 MED ORDER — DIPHENOXYLATE-ATROPINE 2.5-0.025 MG PO TABS: 1.0000 | ORAL_TABLET | Freq: Four times a day (QID) | ORAL | 2 refills | Status: DC | PRN

## 2017-02-01 MED ORDER — HEPARIN SOD (PORK) LOCK FLUSH 100 UNIT/ML IV SOLN
500.0000 [IU] | Freq: Once | INTRAVENOUS | Status: AC | PRN
  Administered 2017-02-01: 500 [IU] via INTRAVENOUS
  Filled 2017-02-01: qty 5

## 2017-02-01 MED ORDER — SODIUM CHLORIDE 0.9% FLUSH
10.0000 mL | INTRAVENOUS | Status: DC | PRN
  Administered 2017-02-01: 10 mL via INTRAVENOUS
  Filled 2017-02-01: qty 10

## 2017-02-01 NOTE — Telephone Encounter (Signed)
Per chart MD has pt taking Ropoinirole 4 mg once a day. Will have to forward to MD for approval.../lmb

## 2017-02-01 NOTE — Telephone Encounter (Signed)
Gave avs and calendar for January waiting on 1/2 °

## 2017-02-01 NOTE — Progress Notes (Signed)
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

## 2017-02-01 NOTE — Telephone Encounter (Signed)
Copied from [place] #[id number]. Topic: Inquiry >> Feb 01, 2017  1:13 PM Patrice Paradise wrote: Reason for CRM: Patient is requesting a refill on the follow medication  ROPINIROLE, the one she take 3x's a day.  Patient stated that she have an appt [facility] today and would like to pick up the Rx today.

## 2017-02-02 ENCOUNTER — Other Ambulatory Visit: Payer: Self-pay

## 2017-02-02 LAB — CANCER ANTIGEN 19-9: CAN 19-9: 13 U/mL (ref 0–35)

## 2017-02-02 MED ORDER — ROPINIROLE HCL 2 MG PO TABS: 2.0000 mg | ORAL_TABLET | Freq: Three times a day (TID) | ORAL | 2 refills | Status: DC

## 2017-02-02 NOTE — Telephone Encounter (Signed)
Sent rx to [facility] for Ropinirole 2mg  TID...Melissa Myers

## 2017-02-02 NOTE — Telephone Encounter (Signed)
Okay for 2 mg ropinirole TID as per prior phone note.

## 2017-02-08 ENCOUNTER — Telehealth: Payer: Self-pay | Admitting: Internal Medicine

## 2017-02-08 DIAGNOSIS — C259 Malignant neoplasm of pancreas, unspecified: Secondary | ICD-10-CM | POA: Diagnosis not present

## 2017-02-08 DIAGNOSIS — Z6841 Body Mass Index (BMI) 40.0 and over, adult: Secondary | ICD-10-CM | POA: Diagnosis not present

## 2017-02-08 DIAGNOSIS — E1129 Type 2 diabetes mellitus with other diabetic kidney complication: Secondary | ICD-10-CM | POA: Diagnosis not present

## 2017-02-08 DIAGNOSIS — I10 Essential (primary) hypertension: Secondary | ICD-10-CM | POA: Diagnosis not present

## 2017-02-08 NOTE — Telephone Encounter (Signed)
Copied from [place]. Topic: Inquiry >> Feb 08, 2017 10:55 AM Conception Chancy, NT wrote: Reason for CRM: Olivia Mackie is calling from [place] - she wanted to leave an update on the patient before her visit tomorrow. she was in pt home yesterday and her weight was 202.6. She has 1 plus hiding edema in her bilateral lower extremidys  she has been up a lot more and more active. Her appetite is good and more fluids a day. Olivia Mackie did give her torsemide 3 days last week 100 mg.   Olivia Mackie [phone]

## 2017-02-08 NOTE — Telephone Encounter (Signed)
Noted  

## 2017-02-15 ENCOUNTER — Telehealth: Payer: Self-pay | Admitting: Internal Medicine

## 2017-02-15 NOTE — Telephone Encounter (Signed)
Melissa Myers was informed of MD response

## 2017-02-15 NOTE — Telephone Encounter (Signed)
This is okay for her to take and is ordered daily prn.

## 2017-02-15 NOTE — Telephone Encounter (Signed)
Melissa Myers in home RN - [phone] called in to make Dr. Sharlet Salina aware that she is out at the pt's home and pt has gained 3 lbs since last week. Pt now weighs 205.6, she would like to know if pt can take Torsemide?   Pt has 1 plus adema on the lower part of extreme ties and is having pain in her left foot.      Please advise.

## 2017-02-16 ENCOUNTER — Telehealth: Payer: Self-pay | Admitting: *Deleted

## 2017-02-16 ENCOUNTER — Other Ambulatory Visit: Payer: Self-pay | Admitting: *Deleted

## 2017-02-16 DIAGNOSIS — C250 Malignant neoplasm of head of pancreas: Secondary | ICD-10-CM

## 2017-02-16 NOTE — Telephone Encounter (Signed)
Received vm call from pt asking when she is supposed to drink her barium for CT.  Reviewed chart & CT not ordered.  Ordered per Dr Ernestina Penna OV note & [facility] Sched[facility] notified & they will notify pt.

## 2017-02-17 NOTE — Progress Notes (Signed)
Melissa Myers  [phone] [phone]  Clinic Follow Up Note   Patient Care Team: [name], MD as PCP - General (Internal Medicine) Rigoberto Noel, MD as Consulting Physician (Pulmonary Disease) Larey Dresser, MD as Consulting Physician (Cardiology) Megan Salon, MD as Consulting Physician (Gynecology) Kristeen Miss, MD as Consulting Physician (Neurosurgery) Earlie Server, MD (Orthopedic Surgery) Almedia Balls, MD (Orthopedic Surgery) Lafayette Dragon, MD (Inactive) (Gastroenterology) Renato Shin, MD (Endocrinology) Darleen Crocker, MD (Ophthalmology)   Date of Service: 02/22/2017  CHIEF COMPLAINTS:  Follow up pancreatic Cancer   Oncology History   Cancer Staging Malignant neoplasm of head of pancreas [facility]) Staging form: Exocrine Pancreas, AJCC 8th Edition - Clinical stage from 04/28/2016: Stage IB (cT2, cN0, cM0) - Signed by Truitt Merle, MD on 02/01/2017       Malignant neoplasm of head of pancreas ([place])   02/2016 Initial Diagnosis    Malignant neoplasm of head of pancreas ([place])      03/15/2016 - 03/21/2016 Hospital Admission    Presents with nausea, vomiting, and jaundice   Patient was found hyperbilirubinemic with abnormal abdominal CT suggesting obstructive physiology. Referred for admission and evaluation.      03/15/2016 Imaging    CT Abdomen Pelvis w Contrast 1. Cholecystectomy with borderline biliary duct dilatation. 2. Pancreatic atrophy with moderate pancreatic duct dilatation, followed to the level of the duodenum. 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 Considerations include otherwise occult stricture, periampullary lesion, or main duct intraductal papillary mucinous neoplasm. Given the combination of borderline biliary duct dilatation, moderate pancreatic duct dilatation, and the clinical history of jaundice, consider further evaluation with ERCP. If the patient is not a good ERCP candidate, MRCP with and  without contrast would be another option. 3.  No acute process in the abdomen or pelvis. 4.  Tiny hiatal hernia. 5.  Aortic atherosclerosis.      03/16/2016 Imaging    MRI Abdomen 1. Hypoenhancing lesion in the uncinate process of the pancreas, better demonstrated by MR than on the recent CT scan. The dilated main pancreatic duct and dilated common bile duct abruptly terminate at the cranial margin of this lesion. MR imaging features are concerning for pancreatic adenocarcinoma. ERCP with endoscopic ultrasound would likely prove helpful to further evaluate. 2. No definite metastatic disease in the liver. No peripancreatic lymphadenopathy is identified. Of note, this exam is limited by marked motion degradation and small liver lesions could be obscured. CT scan earlier today demonstrated no peripancreatic lymphadenopathy.      03/18/2016 Procedure    ERCP 1. Malignant-appearing distal bile duct stricturestatus post ERCP with sphincterotomy and plastic biliary stent placement 2. Guidewire finding false lumen with subsequentinjection as described 3. Status post cholecystectomy  Recommendation: 1. Clear liquid diet 2. Unasyn 1.5 g every 6 hours 3. Standard post ERCP observation. Indomethacin suppositories given 4. Trend LFTs 5. Will need endoscopic ultrasound with biopsy with Dr. Ardis Hughs outpatient.      04/28/2016 Pathology Results    Endoscopic [place] and Biopsy FINE NEEDLE ASPIRATION, ENDOSCOPIC, PANCREAS UNCINATE (SPECIMEN 1 OF 1 COLLECTED 04/28/16): MALIGNANT CELLS CONSISTENT WITH ADENOCARCINOMA.      05/04/2016 -  Hospital Admission    Presents with fever, abdominal pain, malaise, confusion. She was treated for acute cholangitis.       05/05/2016 Imaging    CT Abdomen Pelvis w Contrast Interval placement of a bile duct stent with some decompression of the bile ducts. Mild residual biliary dilatation. Pancreatic ductal dilatation and atrophy. Known  pancreatic mass lesion is  not well depicted at CT. No evidence of bowel obstruction or inflammation.      05/05/2016 Imaging    CT Head w/o Contrast 05/05/16 IMPRESSION: No acute intracranial pathology.      05/06/2016 Procedure    ERCP and Bilary stent placement  Imaging demonstrating removal of endoscopic common bile duct stent. The stent was not able to be replaced.       05/07/2016 Imaging    CT Chest wo Contrast  1. New small right and trace left pleural effusions. Mild cardiomegaly. 2. Borderline enlarged right hilar lymph node at 1 cm in short axis. 3. No definite findings of metastatic disease to the chest. 4. Centrilobular emphysema. 5. High density in the visualized part of the dorsal pancreatic duct, probably contrast medium. 6. Subacute healing fracture of the right seventh rib laterally. 7. Atherosclerosis, including the left anterior descending coronary artery.      05/07/2016 Procedure    On 05/07/16, percutaneous transhepatic cholangiogram and placement of a 27F internal/external biliary drain was performed by IR with aspirated purulent bile sent for culture. Bile culture grew pantoea species, enterococcus faecalis, viridans streptococcus.      07/11/2016 - 07/21/2016 Radiation Therapy    Diagnosis:   cT2N0M0  adenocarcinoma of the pancreas  Indication for treatment:  Curative  Radiation treatment dates:   07/11/16 - 07/21/16  Site/dose:   Pancreas: 33 Gy in 5 fractions  Beams/energy:   SBRT/SRT-VMAT // 10X-FFF Photon  Narrative: The patient tolerated radiation treatment relatively well. The patient complained of diarrhea during treatment. She was instructed to purchase Imodium.      08/2016 - 10/2016 Chemotherapy    She had chemotherapy consistent of gemcitabine and abraxine for 2-3 months under the care of Dr. Berneice Gandy at [facility] with her last treatment being about 10/2016.        02/20/2017 Imaging    CT CAP 02/20/17  IMPRESSION: 1. Response to therapy of the primary pancreatic  head/uncinate process lesion. Please note that delineation of the tumor is difficult secondary to anatomic distortion from therapy. 2. No new or progressive disease identified. 3. Removal of biliary stent, with pneumobilia and borderline similar intrahepatic biliary duct dilatation. 4.  No acute process or evidence of metastatic disease in the chest. 5.  Possible constipation. 6. Coronary artery atherosclerosis. Aortic Atherosclerosis (ICD10-I70.0). 7.  Emphysema (ICD10-J43.9). 8. Pulmonary artery enlargement suggests pulmonary arterial hypertension.      02/22/2017 -  Chemotherapy    Single agent Gemcitabine every 2-3 weeks on and 1 week off, starting 02/22/17         HISTORY OF PRESENTING ILLNESS (05/19/2016):  Melissa Myers 72 y.o. female is here because the patient presented to the ED on 03/15/16 due to weakness, hyperglycemia, jaundice, abdominal pain, mild confusion, and chronic diarrhea. Physical exam at the time revealed abdominal distension and diffuse tenderness. Labs found the patient hyperbilirubinemic. CT scan of the abdomen showed pancreatic atrophy with moderate pancreatic duct dilatation, followed to the level of the duodenum. 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 Considerations include otherwise occult stricture, periampullary lesion, or main duct intraductal papillary mucinous neoplasm. MRI of the abdomen the same day showed a 2.2 x 2.8 cm hypoenhancing lesion in the uncinate process of the pancreas, better demonstrated by MRI than on the recent CT scan. The dilated main pancreatic duct and dilated common bile duct abruptly terminate at the cranial margin of this lesion. MRI features are concerning for pancreatic adenocarcinoma.CA 19-9 on 03/17/16 was  107; elevated.  The patient underwent ERCP, with biliary sphincterotomy and biliary stent placement by Dr. Henrene Pastor 03/18/16. The patient was subsequently discharged on 03/21/16.  On 03/27/16, the patient presented to the ED on  03/29/16 for flu like symptoms. She was Influenza A positive. She was given fluids and discharged with Tamiflu. She presented back to the ED the following day for worsening symptoms. She was started on antibiotics for possible developing pneumonia giving a productive cough.  Given enough time to recover from her hospitalization and the flu, the patient underwent biopsy of the pancreatic mass by Dr. Ardis Hughs on 04/28/16 revealing adenocarcinoma.  Since then, the patient presented to the ED on 05/04/16 for a headaches, hyperglycemia, confusion, abdominal pain, and nausea. Given presentation of fever/confusion, she was admitted to the hospital.  CT of the head on 05/05/16 was negative. CT of the abd/pelvis on 05/05/16 showed interval placement of a bile duct stent with some decompression of the bile ducts, mild residual biliary dilatation, and pancreatic ductal dilatation and atrophy.  She was diagnosed with cholangitis and recurrent jaundice. Zosyn was started. ERCP on 05/06/16 showed a clogged stent which was removed, though a new stent was not able to be placed across a malignant biliary stricture. On 05/07/16, percutaneous transhepatic cholangiogram and placement of a 25F internal/external biliary drain was performed by IR with aspirated purulent bile sent for culture. Blood cultures remained negative, though bile culture grew pantoea species, enterococcus faecalis, viridans streptococcus.Urine culture had also grown Klebsiella pneumoniae, sensitive to Zosyn.  I see the patient today in an inpatient setting to discuss treatment options for the management of her pancreatic adenocarcinoma. I met patient, her husband and son in her room. According to patient's family, she used to be independent, does self care and housework, walks with a walker. Due to fatigue, anorexia, she has not been able to perform her daily activities in the past 4-6 weeks, and has spent most time in bed and chair for the past few weeks.      CURRENT THERAPY:  single agent gemcitabine 2 weeks on and 1 week off starting 02/22/17    INTERIM HISTORY:  Allaina returns for follow-up. She was accompanied by her Husband, daughter and grandson.  She notes she is doing well and notes since last visit she has increase in diarrhea. This has been ongoing for a few weeks. Last night she went 2-3 times in 30 minutes. It has an orange oil film to the stool. The stool is watery and has a strong foul odor. She is taking creon that she takes 4 in the morning and 3 with each meal. She will often forget. She has care givers as her family believes she will refuse to take it. She and her family notes her memory has been compromised.      MEDICAL HISTORY:  Past Medical History:  Diagnosis Date  . Anemia   . Cancer [facility])    Pancreatic  . Cellulitis    LOWER EXTREMITIES  . Chronic diarrhea    a/w nausea - felt related to IBS  . Deaf    left side only  . Diastolic CHF ([place])   . Disc degeneration, lumbar   . GERD (gastroesophageal reflux disease)   . Hyperlipidemia    hx rhabdo on statins  . Hypertension   . Neuropathy    feet, toes and fingers  . On home oxygen therapy    uses oxygen 2 liters min per [place] at night and prn during day  .  OSA (obstructive sleep apnea)    05/2009 sleep study - refuses CPAP  . Osteoarthritis   . RLS (restless legs syndrome)   . Shortness of breath    chronic  . Stasis dermatitis   . Type II or unspecified type diabetes mellitus without mention of complication, not stated as uncontrolled    insulin dep    SURGICAL HISTORY: Past Surgical History:  Procedure Laterality Date  . BILIARY STENT PLACEMENT N/A 05/06/2016   Procedure: BILIARY STENT PLACEMENT;  Surgeon: Gatha Mayer, MD;  Location: [place];  Service: Endoscopy;  Laterality: N/A;  . CHOLECYSTECTOMY  1997  . COLONOSCOPY N/A 12/03/2012   Procedure: COLONOSCOPY;  Surgeon: Lafayette Dragon, MD;  Location: [facility];  Service: Endoscopy;   Laterality: N/A;  . ENDOSCOPIC RETROGRADE CHOLANGIOPANCREATOGRAPHY (ERCP) WITH PROPOFOL N/A 05/06/2016   Procedure: ENDOSCOPIC RETROGRADE CHOLANGIOPANCREATOGRAPHY (ERCP) WITH PROPOFOL;  Surgeon: Gatha Mayer, MD;  Location: [place];  Service: Endoscopy;  Laterality: N/A;  . ERCP N/A 03/18/2016   Procedure: ENDOSCOPIC RETROGRADE CHOLANGIOPANCREATOGRAPHY (ERCP);  Surgeon: Irene Shipper, MD;  Location: [facility];  Service: Endoscopy;  Laterality: N/A;  . EUS N/A 04/28/2016   Procedure: UPPER ENDOSCOPIC ULTRASOUND (EUS) LINEAR;  Surgeon: [name], MD;  Location: [facility];  Service: Endoscopy;  Laterality: N/A;  . IR CHOLANGIOGRAM EXISTING TUBE  06/09/2016  . IR GENERIC HISTORICAL  05/07/2016   IR BILIARY DRAIN PLACEMENT WITH CHOLANGIOGRAM 05/07/2016 Jacqulynn Cadet, MD [facility]-INTER[facility]  . IR GENERIC HISTORICAL  05/19/2016   IR BILIARY STENT(S) EXISTING ACCESS INC DILATION CATH EXCHANGE 05/19/2016 Jacqulynn Cadet, MD [facility]-INTERV RAD  . TONSILLECTOMY  1970  . TUBAL LIGATION  1980  . UMBILICAL HERNIA REPAIR  1995  . uterine polyp removal  2008    SOCIAL HISTORY: Social History   Socioeconomic History  . Marital status: Married    Spouse name: Not on file  . Number of children: Not on file  . Years of education: Not on file  . Highest education level: Not on file  Social Needs  . Financial resource strain: Not on file  . Food insecurity - worry: Not on file  . Food insecurity - inability: Not on file  . Transportation needs - medical: Not on file  . Transportation needs - non-medical: Not on file  Occupational History  . Not on file  Tobacco Use  . Smoking status: Never Smoker  . Smokeless tobacco: Never Used  Substance and Sexual Activity  . Alcohol use: No  . Drug use: No  . Sexual activity: No  Other Topics Concern  . Not on file  Social History Narrative   Lives with spouse and mother. Retired Network engineer, now housewife    FAMILY HISTORY: Family History  Problem  Relation Age of Onset  . Heart disease Mother   . Heart attack Mother 20  . Heart disease Father   . Heart attack Father 61  . Heart disease Unknown        family history  . Stomach cancer Paternal Grandmother   . Lung cancer Paternal Grandfather   . CVA Unknown        several aunts  . Heart attack Unknown        several aunts and an uncle  . Colon cancer Neg Hx     ALLERGIES:  is allergic to morphine; statins; sulfa antibiotics; cymbalta [duloxetine hcl]; sulfasalazine; levemir [insulin detemir]; and zinc.  MEDICATIONS:  Current Outpatient Medications  Medication Sig Dispense Refill  .  aspirin EC 81 MG tablet Take 1 tablet (81 mg total) by mouth daily.    . carvedilol (COREG) 25 MG tablet TAKE ONE TABLET TWICE DAILY WITH FOOD 60 tablet 5  . Cholecalciferol (VITAMIN D3) 2000 units capsule Take 1 capsule (2,000 Units total) by mouth daily. 90 capsule 3  . Continuous Blood Gluc Sensor (FREESTYLE LIBRE SENSOR SYSTEM) MISC Use to check sugars 1 each 0  . diphenoxylate-atropine (LOMOTIL) 2.5-0.025 MG tablet Take 1-2 tablets by mouth 4 (four) times daily as needed for diarrhea or loose stools. 120 tablet 2  . gabapentin (NEURONTIN) 300 MG capsule TAKE TWO CAPSULES FOUR TIMES DAILY 240 capsule 1  . hydrALAZINE (APRESOLINE) 25 MG tablet TAKE 1 TABLET (25 MG TOTAL) BY MOUTH 3 (THREE) TIMES DAILY. 90 tablet 3  . insulin aspart (NOVOLOG) 100 UNIT/ML injection Inject 0-20 Units into the skin 3 (three) times daily with meals. . CBG 70 - 120: 0 units CBG 121 - 150: 3 units CBG 151 - 200: 4 units CBG 201 - 250: 7 units CBG 251 - 300: 11 units CBG 301 - 350: 15 units CBG 351 - 400: 20 units    . insulin glargine (LANTUS) 100 UNIT/ML injection Inject 0.3 mLs (30 Units total) into the skin daily.    [name] lidocaine (LIDODERM) 5 % Place 1 patch daily onto the skin. Remove & Discard patch within 12 hours or as directed by MD 30 patch 0  . lidocaine-prilocaine (EMLA) cream Apply to port site prior to  being accessed    . lipase/protease/amylase (CREON) 36000 UNITS CPEP capsule 2 po prior to snacks; 4 po prior to meals    . meclizine (ANTIVERT) 25 MG tablet Take 25 mg 2 (two) times daily as needed by mouth for dizziness.     . metFORMIN (GLUCOPHAGE) 500 MG tablet Take 500 mg by mouth daily with breakfast.    . mirtazapine (REMERON) 30 MG tablet Take 1 tablet (30 mg total) by mouth at bedtime. 30 tablet 2  . ondansetron (ZOFRAN) 8 MG tablet Take 1 tablet (8 mg total) by mouth every 8 (eight) hours as needed for nausea or vomiting. 30 tablet 2  . ONETOUCH VERIO test strip USE AS INSTRUCTED UP TO 4 TIMES DAILY 200 each 11  . oxyCODONE (OXY IR/ROXICODONE) 5 MG immediate release tablet Take 1 tablet (5 mg total) by mouth 2 (two) times daily as needed for severe pain. 60 tablet 0  . pregabalin (LYRICA) 75 MG capsule Take 1 capsule (75 mg total) daily by mouth. 30 capsule 3  . rOPINIRole (REQUIP) 2 MG tablet Take 1 tablet (2 mg total) by mouth 3 (three) times daily. 90 tablet 2  . sertraline (ZOLOFT) 50 MG tablet Take 50 mg by mouth daily.    [name] spironolactone (ALDACTONE) 25 MG tablet Take 1 tablet (25 mg total) by mouth daily. 90 tablet 3  . traMADol (ULTRAM) 50 MG tablet Take 1 tablet (50 mg total) by mouth every 6 (six) hours as needed. (Patient taking differently: Take 50 mg every 6 (six) hours as needed by mouth for moderate pain. ) 60 tablet 0  . lactulose (CHRONULAC) 10 GM/15ML solution Take 10 g by mouth daily as needed for mild constipation.    [name] rOPINIRole (REQUIP XL) 4 MG 24 hr tablet TAKE ONE TABLET AT BEDTIME (Patient not taking: Reported on 02/22/2017) 30 tablet 5  . silver sulfADIAZINE (SILVADENE) 1 % cream Apply 1 application daily topically. (Patient not taking: Reported on 02/22/2017) 50  g 0   No current facility-administered medications for this visit.    Facility-Administered Medications Ordered in Other Visits  Medication Dose Route Frequency Provider Last Rate Last Dose  . sodium  chloride flush (NS) 0.9 % injection 10 mL  10 mL Intracatheter PRN Truitt Merle, MD   10 mL at 02/22/17 1729    REVIEW OF SYSTEMS:   Constitutional: (+) increased energy for activities Eyes: Denies blurriness of vision, double vision or watery eyes Ears, nose, mouth, throat, and face: Denies mucositis or sore throat Respiratory: Denies cough, dyspnea or wheezes Cardiovascular: Denies palpitation, chest discomfort or lower extremity swelling Gastrointestinal:  (+) significant diarrhea, orange residue of stool  Skin: Denies abnormal skin rashes Lymphatics: Denies new lymphadenopathy or easy bruising MSK: (+) arthritis  Neurological:(+) neuropathy (+) change in short term memory  Behavioral/Psych: Mood is stable, no new changes  All other systems were reviewed with the patient and are negative.  PHYSICAL EXAMINATION:  ECOG PERFORMANCE STATUS: 3 - Symptomatic, >50% confined to bed  Vitals:   02/22/17 1342  BP: (!) 153/67  Pulse: 72  Resp: 18  Temp: 98 F (36.7 C)  SpO2: 100%   Filed Weights   02/22/17 1342  Weight: 207 lb 11.2 oz (94.2 kg)    GENERAL: Drowsy, sitting in wheelchair, wrapped with blankets SKIN: skin color, texture, turgor are normal, no rashes or significant lesions EYES: normal, conjunctiva are pink and non-injected, sclera clear OROPHARYNX:no exudate, no erythema and lips, buccal mucosa, and tongue normal  NECK: supple, thyroid normal size, non-tender, without nodularity LYMPH:  no palpable lymphadenopathy in the cervical, axillary or inguinal LUNGS: clear to auscultation and percussion with normal breathing effort HEART: regular rate & rhythm and no murmurs and no lower extremity edema ABDOMEN: tenderness to epigastrium, but overall abdomen is soft. No organomegaly, bowel sounds normal. Musculoskeletal: no cyanosis of digits and no clubbing. Bilateral symmetric trace edema to BLE.  PSYCH: alert & oriented x 3 with fluent speech NEURO: no focal motor/sensory  deficits   LABORATORY DATA:  I have reviewed the data as listed CBC Latest Ref Rng & Units 02/22/2017 02/01/2017 12/27/2016  WBC 3.9 - 10.3 10e3/uL 9.4 9.6 8.8  Hemoglobin 11.6 - 15.9 g/dL 9.6(L) 9.7(L) 9.4(L)  Hematocrit 34.8 - 46.6 % 31.5(L) 31.5(L) 29.8(L)  Platelets 145 - 400 10e3/uL 337 301 338   CMP Latest Ref Rng & Units 02/22/2017 02/01/2017 12/27/2016  Glucose 70 - 140 mg/dl 198(H) 161(H) 120(H)  BUN 7.0 - 26.0 mg/dL 19.9 18.7 11  Creatinine 0.6 - 1.1 mg/dL 0.9 1.0 0.64  Sodium 136 - 145 mEq/L 139 142 138  Potassium 3.5 - 5.1 mEq/L 4.0 3.4(L) 3.7  Chloride 101 - 111 mmol/L - - 107  CO2 22 - 29 mEq/L _0 Calcium 8.4 - 10.4 mg/dL 9.3 9.1 8.5(L)  Total Protein 6.4 - 8.3 g/dL 7.1 6.7 6.2(L)  Total Bilirubin 0.20 - 1.20 mg/dL 0.31 0.45 0.6  Alkaline Phos 40 - 150 U/L 250(H) 186(H) 161(H)  AST 5 - 34 U/L _1 ALT 0 - 55 U/L 19 14 10(L)     RADIOGRAPHIC STUDIES: I have personally reviewed the radiological images as listed and agreed with the findings in the report. Ct Chest W Contrast  Result Date: 02/20/2017 CLINICAL DATA:  Pancreatic adenocarcinoma. Recurrent suspected. Diagnosed in January 2018. Prior cholangitis. Radiation therapy 07/11/2016 through 07/21/2016. EXAM: CT CHEST, ABDOMEN, AND PELVIS WITH CONTRAST TECHNIQUE: Multidetector CT imaging of the chest, abdomen and  pelvis was performed following the standard protocol during bolus administration of intravenous contrast. CONTRAST:  180m ISOVUE-300 IOPAMIDOL (ISOVUE-300) INJECTION 61% COMPARISON:  Chest CT of 12/08/2016. abdominopelvic CT most recent 12/27/2016. FINDINGS: CT CHEST FINDINGS Cardiovascular: A right-sided Port-A-Cath which terminates at the low SVC. Aortic atherosclerosis. Mild cardiomegaly with LAD coronary artery atherosclerosis. Pulmonary artery enlargement, outflow tract 3.1 cm. No central pulmonary embolism, on this non-dedicated study. Mediastinum/Nodes: No supraclavicular adenopathy. No mediastinal  or hilar adenopathy. Lungs/Pleura: No pleural fluid. Minimal motion degradation. Mild centrilobular emphysema. 2 mm left lower lobe pulmonary nodule may have been present on the prior. Musculoskeletal: Remote third right rib fracture. CT ABDOMEN PELVIS FINDINGS Hepatobiliary: Late arterial phase imaging demonstrates no hypervascular lesions in the liver. Mild hepatomegaly at 17.6 cm craniocaudal. No focal liver lesion. Pneumobilia. Interval removal of metallic biliary stent. Cholecystectomy with upper normal intrahepatic ductal size, similar. Pancreas: Progression of pancreatic atrophy within the body and tail. Persistent duct dilatation. This is followed to the level of an area of relative soft tissue fullness involving the head and uncinate process. Difficult to differentiate from surrounding anatomy. Felt to measure on the order of 2.0 x 1.6 cm on image 100/series 6. Decreased from 3.1 x 3.0 cm on 12/27/2016. No superimposed acute pancreatitis. Spleen: Low-density splenic lesions have been present on prior exams. The largest measures 10 mm today and is relatively simple in appearance. Adrenals/Urinary Tract: Left adrenal thickening. Normal right adrenal gland. Too small to characterize bilateral renal lesions. No hydronephrosis. Normal urinary bladder. Stomach/Bowel: Normal stomach, without wall thickening. Colonic stool burden suggests constipation. Normal terminal ileum. Normal small bowel. Vascular/Lymphatic: Aortic and branch vessel atherosclerosis. SMA involvement by tumor. Patent portal and splenic veins. No abdominopelvic adenopathy. Reproductive: Uterine calcifications could represent underlying fibroids. No adnexal mass. Other: No significant free fluid. Fat containing bilateral inguinal hernias. Moderate pelvic floor laxity. No abdominal ascites. No evidence of omental or peritoneal disease. Musculoskeletal: Osteopenia.  Lumbosacral spondylosis. IMPRESSION: 1. Response to therapy of the primary  pancreatic head/uncinate process lesion. Please note that delineation of the tumor is difficult secondary to anatomic distortion from therapy. 2. No new or progressive disease identified. 3. Removal of biliary stent, with pneumobilia and borderline similar intrahepatic biliary duct dilatation. 4.  No acute process or evidence of metastatic disease in the chest. 5.  Possible constipation. 6. Coronary artery atherosclerosis. Aortic Atherosclerosis (ICD10-I70.0). 7.  Emphysema (ICD10-J43.9). 8. Pulmonary artery enlargement suggests pulmonary arterial hypertension. Electronically Signed   By: KAbigail MiyamotoM.D.   On: 02/20/2017 19:32   Ct Abdomen Pelvis W Contrast  Result Date: 02/20/2017 CLINICAL DATA:  Pancreatic adenocarcinoma. Recurrent suspected. Diagnosed in January 2018. Prior cholangitis. Radiation therapy 07/11/2016 through 07/21/2016. EXAM: CT CHEST, ABDOMEN, AND PELVIS WITH CONTRAST TECHNIQUE: Multidetector CT imaging of the chest, abdomen and pelvis was performed following the standard protocol during bolus administration of intravenous contrast. CONTRAST:  1042mISOVUE-300 IOPAMIDOL (ISOVUE-300) INJECTION 61% COMPARISON:  Chest CT of 12/08/2016. abdominopelvic CT most recent 12/27/2016. FINDINGS: CT CHEST FINDINGS Cardiovascular: A right-sided Port-A-Cath which terminates at the low SVC. Aortic atherosclerosis. Mild cardiomegaly with LAD coronary artery atherosclerosis. Pulmonary artery enlargement, outflow tract 3.1 cm. No central pulmonary embolism, on this non-dedicated study. Mediastinum/Nodes: No supraclavicular adenopathy. No mediastinal or hilar adenopathy. Lungs/Pleura: No pleural fluid. Minimal motion degradation. Mild centrilobular emphysema. 2 mm left lower lobe pulmonary nodule may have been present on the prior. Musculoskeletal: Remote third right rib fracture. CT ABDOMEN PELVIS FINDINGS Hepatobiliary: Late arterial phase imaging demonstrates no hypervascular  lesions in the liver. Mild  hepatomegaly at 17.6 cm craniocaudal. No focal liver lesion. Pneumobilia. Interval removal of metallic biliary stent. Cholecystectomy with upper normal intrahepatic ductal size, similar. Pancreas: Progression of pancreatic atrophy within the body and tail. Persistent duct dilatation. This is followed to the level of an area of relative soft tissue fullness involving the head and uncinate process. Difficult to differentiate from surrounding anatomy. Felt to measure on the order of 2.0 x 1.6 cm on image 100/series 6. Decreased from 3.1 x 3.0 cm on 12/27/2016. No superimposed acute pancreatitis. Spleen: Low-density splenic lesions have been present on prior exams. The largest measures 10 mm today and is relatively simple in appearance. Adrenals/Urinary Tract: Left adrenal thickening. Normal right adrenal gland. Too small to characterize bilateral renal lesions. No hydronephrosis. Normal urinary bladder. Stomach/Bowel: Normal stomach, without wall thickening. Colonic stool burden suggests constipation. Normal terminal ileum. Normal small bowel. Vascular/Lymphatic: Aortic and branch vessel atherosclerosis. SMA involvement by tumor. Patent portal and splenic veins. No abdominopelvic adenopathy. Reproductive: Uterine calcifications could represent underlying fibroids. No adnexal mass. Other: No significant free fluid. Fat containing bilateral inguinal hernias. Moderate pelvic floor laxity. No abdominal ascites. No evidence of omental or peritoneal disease. Musculoskeletal: Osteopenia.  Lumbosacral spondylosis. IMPRESSION: 1. Response to therapy of the primary pancreatic head/uncinate process lesion. Please note that delineation of the tumor is difficult secondary to anatomic distortion from therapy. 2. No new or progressive disease identified. 3. Removal of biliary stent, with pneumobilia and borderline similar intrahepatic biliary duct dilatation. 4.  No acute process or evidence of metastatic disease in the chest. 5.   Possible constipation. 6. Coronary artery atherosclerosis. Aortic Atherosclerosis (ICD10-I70.0). 7.  Emphysema (ICD10-J43.9). 8. Pulmonary artery enlargement suggests pulmonary arterial hypertension. Electronically Signed   By: Abigail Miyamoto M.D.   On: 02/20/2017 19:32     CT CAP 02/20/17  IMPRESSION: 1. Response to therapy of the primary pancreatic head/uncinate process lesion. Please note that delineation of the tumor is difficult secondary to anatomic distortion from therapy. 2. No new or progressive disease identified. 3. Removal of biliary stent, with pneumobilia and borderline similar intrahepatic biliary duct dilatation. 4.  No acute process or evidence of metastatic disease in the chest. 5.  Possible constipation. 6. Coronary artery atherosclerosis. Aortic Atherosclerosis (ICD10-I70.0). 7.  Emphysema (ICD10-J43.9). 8. Pulmonary artery enlargement suggests pulmonary arterial hypertension.    CT Angio Chest PE W or WO Contrast, 12/08/2016 IMPRESSION: No evidence of pulmonary embolism. Thickening of the pulmonic valve ; if there is clinical concern for valvular disease consider echocardiography. Mild dilatation of the main pulmonary artery which may reflect pulmonary artery hypertension. Atherosclerotic calcifications aorta and coronary arteries. Aortic Atherosclerosis (ICD10-I70.0).  CT Chest WO Contrast 05/07/16 Carlean Purl IMPRESSION: 1. New small right and trace left pleural effusions. Mild cardiomegaly. 2. Borderline enlarged right hilar lymph node at 1 cm in short axis. 3. No definite findings of metastatic disease to the chest. 4. Centrilobular emphysema. 5. High density in the visualized part of the dorsal pancreatic duct, probably contrast medium. 6. Subacute healing fracture of the right seventh rib laterally. 7. Atherosclerosis, including the left anterior descending coronary artery.  DG ERCP 05/06/16 Carlean Purl IMPRESSION: Imaging demonstrating removal of endoscopic  common bile duct stent. The stent was not able to be replaced.   CT Abd, Head, Pelvis 05/05/16 IMPRESSION: Interval placement of a bile duct stent with some decompression of the bile ducts. Mild residual biliary dilatation. Pancreatic ductal dilatation and atrophy. Known pancreatic mass lesion  is not well depicted at CT. No evidence of bowel obstruction or inflammation.   ASSESSMENT & PLAN: 71 y.o.female with PMH of IDDM with neuropathy, HFpEF, HTN, OSA on intermittent home oxygen, and recently diagnosed pancreatic mass s/p biliary stent 04/28/2016 who was recently treated for acute cholangitis, status post cutaneous biliary drainage. She had biliary drainage tube exchange with peri-stent placement by IR Dr. Laurence Ferrari today  1. Pancreatic adenocarcinoma, borderline resectable, cT2N0M0, stage IB --I previously reviewed her CT scan findings, her pancreatic cancer is probably resectable, no evidence of distant metastasis on CT scans. She was evaluated by general surgeon Dr. Barry Dienes. However she may not be a good candidate due to her advanced age, comorbidities, malnutrition and a very poor performance status. -We previously reviewed the natural history of pancreatic cancer, and a very high risk of recurrence after surgical resection. Most people requires neoadjuvant or adjuvant chemotherapy and possible radiation -We previously have discussed her case in our she had tumor board. Dr. Barry Dienes did not think she was a candidate for whipple surgery. I recommended RT.  -She completed Pancreatic SBRT with Dr. Lisbeth Renshaw 07/11/16 - 07/21/16. -She had chemotherapy consistent of gemcitabine and abraxane for 2-3 months under the care of Dr. Berneice Gandy at [facility] with her last treatment being about 10/2016.  Her restaging CT scan in September 2018 at [facility] showed stable disease. -Patient was not a candidate for surgery via Dr. Mariah Milling at [facility]  -I reviewed the natural course of pancreatic cancer, and her disease is not curable since  her surgery is not an option. The goal of therapy is to prolong her life and preserve her quality of life. Discussed with patient and her friend regarding continuing chemotherapy vs supportive care alone.  After lengthy discussion with patient and her husband, she chose to proceed with chemo. I suggest single agent gemcitabine 2 weeks on and 1 week off. She is agreeable.  -We reviewed her CT CAP from 02/20/17 which shows her tumor has shrunk and no evidence of metastasis. She has good response to her treatment.  -Given her good response in scan and her improved condition, I can consult Dr. Barry Dienes about eligibility for surgery.  -She plans to start her single agent Gemcitabine today will continue weekly for 2 weeks, every 21 days.  I do not think she is a candidate for gemcitabine and Abraxane, which she was previously on. -F/u in 3 weeks    2. Diarrhea, possibly related to pancreatic insufficiency  -Rule out C. Diff with lab today. She stool was loose but not watery, and c-diff PCR was not to perform.  No increased white count, less likely this is infectious. -Encouraged her to continue Creon -will treat patient diarrhea with lomotil Rx.  -Diarrhea has increased, I encouraged her to titrate up her lomotil up to 8 tablets a day with imodium as needed. I refilled lomotil previously  -I advised her to increase lomotil up to 6 tabs a day and to take her creon in the morning and then with every meal.   3. IDDM, morbid obesity -follow up with PCP  4. Chronic diastolic heart failure -follow up with PCP  5. Malnutrition and deconditioning -I previously encouraged her to continue physical therapy at his rehabilitation  6. Goal of care discussion  -We again discussed the incurable nature of her cancer, and the overall poor prognosis, especially if she does not have good response to chemotherapy or progress on chemo -The patient understands the goal of care is palliative. -I  recommend DNR/DNI, she  will think about it    PLAN: -I advised her to take her Creon as prescribed and to increase her lomotil up to 6 tabs a day for her diarrhea  -Start single agent gemcitabine today 154m/m2 on day 1, 8 every 21 days  -Lab, flush, and gemcitabine in 3 and 4 weeks -F/u in 3 weeks      No orders of the defined types were placed in this encounter.   All questions were answered. The patient knows to call the clinic with any problems, questions or concerns. I spent 20 minutes counseling the patient face to face. The total time spent in the appointment was 30 minutes and more than 50% was on counseling.  This document serves as a record of services personally performed by YTruitt Merle MD. It was created on her behalf by AJoslyn Devon a trained medical scribe. The creation of this record is based on the scribe's personal observations and the provider's statements to them.    I have reviewed the above documentation for accuracy and completeness, and I agree with the above.     YTruitt Merle MD 02/22/2017

## 2017-02-20 ENCOUNTER — Ambulatory Visit (HOSPITAL_COMMUNITY)
Admission: RE | Admit: 2017-02-20 | Discharge: 2017-02-20 | Disposition: A | Discharge status: Home / Self Care | Payer: PPO | Source: Ambulatory Visit | Attending: Hematology | Admitting: Hematology

## 2017-02-20 ENCOUNTER — Encounter (HOSPITAL_COMMUNITY): Payer: Self-pay

## 2017-02-20 DIAGNOSIS — J439 Emphysema, unspecified: Secondary | ICD-10-CM | POA: Insufficient documentation

## 2017-02-20 DIAGNOSIS — C259 Malignant neoplasm of pancreas, unspecified: Secondary | ICD-10-CM | POA: Diagnosis not present

## 2017-02-20 DIAGNOSIS — C250 Malignant neoplasm of head of pancreas: Secondary | ICD-10-CM | POA: Diagnosis not present

## 2017-02-20 DIAGNOSIS — I2510 Atherosclerotic heart disease of native coronary artery without angina pectoris: Secondary | ICD-10-CM | POA: Diagnosis not present

## 2017-02-20 DIAGNOSIS — I700 Atherosclerosis of aorta: Secondary | ICD-10-CM | POA: Diagnosis not present

## 2017-02-20 DIAGNOSIS — I289 Disease of pulmonary vessels, unspecified: Secondary | ICD-10-CM | POA: Insufficient documentation

## 2017-02-20 MED ORDER — IOPAMIDOL (ISOVUE-300) INJECTION 61%
INTRAVENOUS | Status: AC
  Administered 2017-02-20: 100 mL
  Filled 2017-02-20: qty 100

## 2017-02-22 ENCOUNTER — Ambulatory Visit (HOSPITAL_BASED_OUTPATIENT_CLINIC_OR_DEPARTMENT_OTHER): Payer: PPO

## 2017-02-22 ENCOUNTER — Encounter: Payer: Self-pay | Admitting: Hematology

## 2017-02-22 ENCOUNTER — Ambulatory Visit (HOSPITAL_BASED_OUTPATIENT_CLINIC_OR_DEPARTMENT_OTHER): Payer: PPO | Admitting: Hematology

## 2017-02-22 ENCOUNTER — Telehealth: Payer: Self-pay | Admitting: Hematology

## 2017-02-22 ENCOUNTER — Other Ambulatory Visit (HOSPITAL_BASED_OUTPATIENT_CLINIC_OR_DEPARTMENT_OTHER): Payer: PPO

## 2017-02-22 VITALS — BP 153/67 | HR 72 | Temp 98.0°F | Resp 18 | Wt 207.7 lb

## 2017-02-22 DIAGNOSIS — C250 Malignant neoplasm of head of pancreas: Secondary | ICD-10-CM | POA: Diagnosis not present

## 2017-02-22 DIAGNOSIS — Z794 Long term (current) use of insulin: Secondary | ICD-10-CM | POA: Diagnosis not present

## 2017-02-22 DIAGNOSIS — I10 Essential (primary) hypertension: Secondary | ICD-10-CM

## 2017-02-22 DIAGNOSIS — E119 Type 2 diabetes mellitus without complications: Secondary | ICD-10-CM

## 2017-02-22 DIAGNOSIS — Z5111 Encounter for antineoplastic chemotherapy: Secondary | ICD-10-CM

## 2017-02-22 DIAGNOSIS — R197 Diarrhea, unspecified: Secondary | ICD-10-CM | POA: Diagnosis not present

## 2017-02-22 DIAGNOSIS — Z7189 Other specified counseling: Secondary | ICD-10-CM

## 2017-02-22 DIAGNOSIS — IMO0001 Reserved for inherently not codable concepts without codable children: Secondary | ICD-10-CM

## 2017-02-22 LAB — CBC WITH DIFFERENTIAL/PLATELET
BASO%: 0.3 % (ref 0.0–2.0)
BASOS ABS: 0 10*3/uL (ref 0.0–0.1)
EOS%: 1.5 % (ref 0.0–7.0)
Eosinophils Absolute: 0.1 10*3/uL (ref 0.0–0.5)
HEMATOCRIT: 31.5 % — AB (ref 34.8–46.6)
HGB: 9.6 g/dL — ABNORMAL LOW (ref 11.6–15.9)
LYMPH%: 25.2 % (ref 14.0–49.7)
MCH: 24 pg — ABNORMAL LOW (ref 25.1–34.0)
MCHC: 30.5 g/dL — AB (ref 31.5–36.0)
MCV: 78.8 fL — ABNORMAL LOW (ref 79.5–101.0)
MONO#: 0.5 10*3/uL (ref 0.1–0.9)
MONO%: 5.4 % (ref 0.0–14.0)
NEUT#: 6.4 10*3/uL (ref 1.5–6.5)
NEUT%: 67.6 % (ref 38.4–76.8)
PLATELETS: 337 10*3/uL (ref 145–400)
RBC: 4 10*6/uL (ref 3.70–5.45)
RDW: 16.9 % — ABNORMAL HIGH (ref 11.2–14.5)
WBC: 9.4 10*3/uL (ref 3.9–10.3)
lymph#: 2.4 10*3/uL (ref 0.9–3.3)

## 2017-02-22 LAB — COMPREHENSIVE METABOLIC PANEL
ALT: 19 U/L (ref 0–55)
AST: 17 U/L (ref 5–34)
Albumin: 3 g/dL — ABNORMAL LOW (ref 3.5–5.0)
Alkaline Phosphatase: 250 U/L — ABNORMAL HIGH (ref 40–150)
Anion Gap: 9 mEq/L (ref 3–11)
BUN: 19.9 mg/dL (ref 7.0–26.0)
CHLORIDE: 104 meq/L (ref 98–109)
CO2: 26 meq/L (ref 22–29)
Calcium: 9.3 mg/dL (ref 8.4–10.4)
Creatinine: 0.9 mg/dL (ref 0.6–1.1)
GLUCOSE: 198 mg/dL — AB (ref 70–140)
POTASSIUM: 4 meq/L (ref 3.5–5.1)
SODIUM: 139 meq/L (ref 136–145)
Total Bilirubin: 0.31 mg/dL (ref 0.20–1.20)
Total Protein: 7.1 g/dL (ref 6.4–8.3)

## 2017-02-22 MED ORDER — SODIUM CHLORIDE 0.9 % IV SOLN
Freq: Once | INTRAVENOUS | Status: AC
  Administered 2017-02-22: 16:00:00 via INTRAVENOUS

## 2017-02-22 MED ORDER — HEPARIN SOD (PORK) LOCK FLUSH 100 UNIT/ML IV SOLN
500.0000 [IU] | Freq: Once | INTRAVENOUS | Status: AC | PRN
  Administered 2017-02-22: 500 [IU]
  Filled 2017-02-22: qty 5

## 2017-02-22 MED ORDER — PROCHLORPERAZINE MALEATE 10 MG PO TABS
ORAL_TABLET | ORAL | Status: AC
  Filled 2017-02-22: qty 1

## 2017-02-22 MED ORDER — SODIUM CHLORIDE 0.9 % IV SOLN
2000.0000 mg | Freq: Once | INTRAVENOUS | Status: AC
  Administered 2017-02-22: 2000 mg via INTRAVENOUS
  Filled 2017-02-22: qty 52.6

## 2017-02-22 MED ORDER — PROCHLORPERAZINE MALEATE 10 MG PO TABS
10.0000 mg | ORAL_TABLET | Freq: Once | ORAL | Status: AC
  Administered 2017-02-22: 10 mg via ORAL

## 2017-02-22 MED ORDER — SODIUM CHLORIDE 0.9% FLUSH
10.0000 mL | INTRAVENOUS | Status: AC | PRN
  Administered 2017-02-22: 10 mL
  Filled 2017-02-22: qty 10

## 2017-02-22 NOTE — Telephone Encounter (Signed)
Gave patient avs report and appointments for January  °

## 2017-02-22 NOTE — Patient Instructions (Signed)
[facility] Discharge Instructions for Patients Receiving Chemotherapy  Today you received the following chemotherapy agents Gemcitabine (Gemzar).  To help prevent nausea and vomiting after your treatment, we encourage you to take your nausea medication as prescribed.   If you develop nausea and vomiting that is not controlled by your nausea medication, call the clinic.   BELOW ARE SYMPTOMS THAT SHOULD BE REPORTED IMMEDIATELY:  *FEVER GREATER THAN 100.5 F  *CHILLS WITH OR WITHOUT FEVER  NAUSEA AND VOMITING THAT IS NOT CONTROLLED WITH YOUR NAUSEA MEDICATION  *UNUSUAL SHORTNESS OF BREATH  *UNUSUAL BRUISING OR BLEEDING  TENDERNESS IN MOUTH AND THROAT WITH OR WITHOUT PRESENCE OF ULCERS  *URINARY PROBLEMS  *BOWEL PROBLEMS  UNUSUAL RASH Items with * indicate a potential emergency and should be followed up as soon as possible.  Feel free to call the clinic should you have any questions or concerns. The clinic phone number is [phone].  Please show the CHEMO ALERT CARD at check-in to the Emergency Department and triage nurse.  Gemcitabine injection What is this medicine? GEMCITABINE (jem SIT a been) is a chemotherapy drug. This medicine is used to treat many types of cancer like breast cancer, lung cancer, pancreatic cancer, and ovarian cancer. This medicine may be used for other purposes; ask your health care provider or pharmacist if you have questions. COMMON BRAND NAME(S): Gemzar What should I tell my health care provider before I take this medicine? They need to know if you have any of these conditions: -blood disorders -infection -kidney disease -liver disease -recent or ongoing radiation therapy -an unusual or allergic reaction to gemcitabine, other chemotherapy, other medicines, foods, dyes, or preservatives -pregnant or trying to get pregnant -breast-feeding How should I use this medicine? This drug is given as an infusion into a vein. It is  administered in a hospital or clinic by a specially trained health care professional. Talk to your pediatrician regarding the use of this medicine in children. Special care may be needed. Overdosage: If you think you have taken too much of this medicine contact a poison control center or emergency room at once. NOTE: This medicine is only for you. Do not share this medicine with others. What if I miss a dose? It is important not to miss your dose. Call your doctor or health care professional if you are unable to keep an appointment. What may interact with this medicine? -medicines to increase blood counts like filgrastim, pegfilgrastim, sargramostim -some other chemotherapy drugs like cisplatin -vaccines Talk to your doctor or health care professional before taking any of these medicines: -acetaminophen -aspirin -ibuprofen -ketoprofen -naproxen This list may not describe all possible interactions. Give your health care provider a list of all the medicines, herbs, non-prescription drugs, or dietary supplements you use. Also tell them if you smoke, drink alcohol, or use illegal drugs. Some items may interact with your medicine. What should I watch for while using this medicine? Visit your doctor for checks on your progress. This drug may make you feel generally unwell. This is not uncommon, as chemotherapy can affect healthy cells as well as cancer cells. Report any side effects. Continue your course of treatment even though you feel ill unless your doctor tells you to stop. In some cases, you may be given additional medicines to help with side effects. Follow all directions for their use. Call your doctor or health care professional for advice if you get a fever, chills or sore throat, or other symptoms of a cold   or flu. Do not treat yourself. This drug decreases your body's ability to fight infections. Try to avoid being around people who are sick. This medicine may increase your risk to bruise  or bleed. Call your doctor or health care professional if you notice any unusual bleeding. Be careful brushing and flossing your teeth or using a toothpick because you may get an infection or bleed more easily. If you have any dental work done, tell your dentist you are receiving this medicine. Avoid taking products that contain aspirin, acetaminophen, ibuprofen, naproxen, or ketoprofen unless instructed by your doctor. These medicines may hide a fever. Women should inform their doctor if they wish to become pregnant or think they might be pregnant. There is a potential for serious side effects to an unborn child. Talk to your health care professional or pharmacist for more information. Do not breast-feed an infant while taking this medicine. What side effects may I notice from receiving this medicine? Side effects that you should report to your doctor or health care professional as soon as possible: -allergic reactions like skin rash, itching or hives, swelling of the face, lips, or tongue -low blood counts - this medicine may decrease the number of white blood cells, red blood cells and platelets. You may be at increased risk for infections and bleeding. -signs of infection - fever or chills, cough, sore throat, pain or difficulty passing urine -signs of decreased platelets or bleeding - bruising, pinpoint red spots on the skin, black, tarry stools, blood in the urine -signs of decreased red blood cells - unusually weak or tired, fainting spells, lightheadedness -breathing problems -chest pain -mouth sores -nausea and vomiting -pain, swelling, redness at site where injected -pain, tingling, numbness in the hands or feet -stomach pain -swelling of ankles, feet, hands -unusual bleeding Side effects that usually do not require medical attention (report to your doctor or health care professional if they continue or are bothersome): -constipation -diarrhea -hair loss -loss of appetite -stomach  upset This list may not describe all possible side effects. Call your doctor for medical advice about side effects. You may report side effects to FDA at 1-800-FDA-1088. Where should I keep my medicine? This drug is given in a hospital or clinic and will not be stored at home. NOTE: This sheet is a summary. It may not cover all possible information. If you have questions about this medicine, talk to your doctor, pharmacist, or health care provider.  2018 Elsevier/Gold Standard (2007-06-19 18:45:54)  

## 2017-02-26 ENCOUNTER — Encounter (HOSPITAL_COMMUNITY): Payer: Self-pay

## 2017-02-26 ENCOUNTER — Other Ambulatory Visit: Payer: Self-pay

## 2017-02-26 ENCOUNTER — Emergency Department (HOSPITAL_COMMUNITY): Payer: PPO

## 2017-02-26 ENCOUNTER — Emergency Department (HOSPITAL_COMMUNITY)
Admission: EM | Admit: 2017-02-26 | Discharge: 2017-02-26 | Disposition: A | Discharge status: Home / Self Care | Payer: PPO | Attending: Emergency Medicine | Admitting: Emergency Medicine

## 2017-02-26 DIAGNOSIS — I5032 Chronic diastolic (congestive) heart failure: Secondary | ICD-10-CM | POA: Insufficient documentation

## 2017-02-26 DIAGNOSIS — R4020 Unspecified coma: Secondary | ICD-10-CM | POA: Diagnosis not present

## 2017-02-26 DIAGNOSIS — R4182 Altered mental status, unspecified: Secondary | ICD-10-CM | POA: Diagnosis not present

## 2017-02-26 DIAGNOSIS — Z79899 Other long term (current) drug therapy: Secondary | ICD-10-CM | POA: Insufficient documentation

## 2017-02-26 DIAGNOSIS — E1140 Type 2 diabetes mellitus with diabetic neuropathy, unspecified: Secondary | ICD-10-CM | POA: Insufficient documentation

## 2017-02-26 DIAGNOSIS — Z7982 Long term (current) use of aspirin: Secondary | ICD-10-CM | POA: Insufficient documentation

## 2017-02-26 DIAGNOSIS — E162 Hypoglycemia, unspecified: Secondary | ICD-10-CM

## 2017-02-26 DIAGNOSIS — I110 Hypertensive heart disease with heart failure: Secondary | ICD-10-CM | POA: Diagnosis not present

## 2017-02-26 DIAGNOSIS — E161 Other hypoglycemia: Secondary | ICD-10-CM | POA: Diagnosis not present

## 2017-02-26 DIAGNOSIS — Z794 Long term (current) use of insulin: Secondary | ICD-10-CM | POA: Insufficient documentation

## 2017-02-26 DIAGNOSIS — E11649 Type 2 diabetes mellitus with hypoglycemia without coma: Secondary | ICD-10-CM | POA: Diagnosis not present

## 2017-02-26 LAB — CBC WITH DIFFERENTIAL/PLATELET
BASOS PCT: 0 %
Basophils Absolute: 0 10*3/uL (ref 0.0–0.1)
EOS ABS: 0.1 10*3/uL (ref 0.0–0.7)
Eosinophils Relative: 1 %
HCT: 26 % — ABNORMAL LOW (ref 36.0–46.0)
Hemoglobin: 8.2 g/dL — ABNORMAL LOW (ref 12.0–15.0)
Lymphocytes Relative: 10 %
Lymphs Abs: 0.9 10*3/uL (ref 0.7–4.0)
MCH: 24.3 pg — ABNORMAL LOW (ref 26.0–34.0)
MCHC: 31.5 g/dL (ref 30.0–36.0)
MCV: 77.2 fL — ABNORMAL LOW (ref 78.0–100.0)
MONOS PCT: 1 %
Monocytes Absolute: 0.1 10*3/uL (ref 0.1–1.0)
Neutro Abs: 7.4 10*3/uL (ref 1.7–7.7)
Neutrophils Relative %: 88 %
Platelets: 266 10*3/uL (ref 150–400)
RBC: 3.37 MIL/uL — ABNORMAL LOW (ref 3.87–5.11)
RDW: 17.1 % — AB (ref 11.5–15.5)
WBC: 8.5 10*3/uL (ref 4.0–10.5)

## 2017-02-26 LAB — BASIC METABOLIC PANEL
Anion gap: 10 (ref 5–15)
BUN: 38 mg/dL — AB (ref 6–20)
CALCIUM: 8.2 mg/dL — AB (ref 8.9–10.3)
CO2: 28 mmol/L (ref 22–32)
CREATININE: 1.17 mg/dL — AB (ref 0.44–1.00)
Chloride: 98 mmol/L — ABNORMAL LOW (ref 101–111)
GFR calc non Af Amer: 46 mL/min — ABNORMAL LOW (ref >60)
GFR, EST AFRICAN AMERICAN: 53 mL/min — AB (ref >60)
GLUCOSE: 115 mg/dL — AB (ref 65–99)
Potassium: 2.9 mmol/L — ABNORMAL LOW (ref 3.5–5.1)
Sodium: 136 mmol/L (ref 135–145)

## 2017-02-26 LAB — AMMONIA: Ammonia: 28 umol/L (ref 9–35)

## 2017-02-26 LAB — URINALYSIS, ROUTINE W REFLEX MICROSCOPIC
Bilirubin Urine: NEGATIVE
GLUCOSE, UA: 50 mg/dL — AB
HGB URINE DIPSTICK: NEGATIVE
Ketones, ur: NEGATIVE mg/dL
Leukocytes, UA: NEGATIVE
Nitrite: NEGATIVE
Protein, ur: NEGATIVE mg/dL
SPECIFIC GRAVITY, URINE: 1.01 (ref 1.005–1.030)
pH: 5 (ref 5.0–8.0)

## 2017-02-26 LAB — CBG MONITORING, ED
Glucose-Capillary: 115 mg/dL — ABNORMAL HIGH (ref 65–99)
Glucose-Capillary: 121 mg/dL — ABNORMAL HIGH (ref 65–99)
Glucose-Capillary: 131 mg/dL — ABNORMAL HIGH (ref 65–99)
Glucose-Capillary: 149 mg/dL — ABNORMAL HIGH (ref 65–99)

## 2017-02-26 MED ORDER — DEXTROSE 50 % IV SOLN
0.5000 | Freq: Once | INTRAVENOUS | Status: DC
  Filled 2017-02-26: qty 50

## 2017-02-26 MED ORDER — POTASSIUM CHLORIDE CRYS ER 20 MEQ PO TBCR
40.0000 meq | EXTENDED_RELEASE_TABLET | Freq: Once | ORAL | Status: AC
  Administered 2017-02-26: 40 meq via ORAL
  Filled 2017-02-26: qty 2

## 2017-02-26 MED ORDER — SODIUM CHLORIDE 0.9 % IV BOLUS (SEPSIS)
1000.0000 mL | Freq: Once | INTRAVENOUS | Status: AC
  Administered 2017-02-26: 1000 mL via INTRAVENOUS

## 2017-02-26 MED ORDER — POTASSIUM CHLORIDE 10 MEQ/100ML IV SOLN
10.0000 meq | Freq: Once | INTRAVENOUS | Status: AC
  Administered 2017-02-26: 10 meq via INTRAVENOUS
  Filled 2017-02-26: qty 100

## 2017-02-26 NOTE — ED Notes (Signed)
Bed: HI34 Expected date:  Expected time:  Means of arrival:  Comments: Resus A

## 2017-02-26 NOTE — ED Triage Notes (Signed)
States EMS pts glucose at home was in the 600's and home health person told her to take twice the amount of insulin she usually takes and EMS states blood sugar was in the 50"s 25gms D50 IV and rechecked blood sugar in 150"s pt with eyes closed but wakes up and answers question.  Pt has eaten peanut butter and jelly  sandwich and drank milk.

## 2017-02-26 NOTE — ED Notes (Signed)
Pt resting without s/s of discomfort. Pt continues to be monitored for temp. Fluids given via warmer to help increase body core temp

## 2017-02-26 NOTE — ED Provider Notes (Addendum)
Pt seen by Dr Christy Gentles.  Please see his note.  Pt remains lethargic somnolent.  Labs notable for aki, hypokalemia.   Blood sugar is stable.  With her persistent sx will CT head to evaluate further.     Dorie Rank, MD 02/26/17 [phone]  CT scan is negative for acute findings  Ct Head Wo Contrast  Result Date: 02/26/2017 CLINICAL DATA:  Altered level of consciousness EXAM: CT HEAD WITHOUT CONTRAST TECHNIQUE: Contiguous axial images were obtained from the base of the skull through the vertex without intravenous contrast. COMPARISON:  05/05/2016 FINDINGS: Brain: No evidence of acute infarction, hemorrhage, hydrocephalus, extra-axial collection or mass lesion/mass effect. Mild subcortical white matter and periventricular small vessel ischemic changes. Vascular: Intracranial atherosclerosis. Skull: Normal. Negative for fracture or focal lesion. Sinuses/Orbits: The visualized paranasal sinuses are essentially clear. The mastoid air cells are unopacified. Other: None. IMPRESSION: No evidence of acute intracranial abnormality. Mild small vessel ischemic changes. Electronically Signed   By: Julian Hy M.D.   On: 02/26/2017 10:54   Dg Chest Port 1 View  Result Date: 02/26/2017 CLINICAL DATA:  Unresponsive. EXAM: PORTABLE CHEST 1 VIEW COMPARISON:  CT 02/20/2017 FINDINGS: Right chest port in place with tip in the SVC. Significant patient rotation. Heart size upper normal. No consolidation. No large pleural effusion. No pulmonary edema. No pneumothorax. Periarticular calcification about the right shoulder. IMPRESSION: Rotated exam without evidence of acute abnormality. Electronically Signed   By: Jeb Levering M.D.   On: 02/26/2017 07:01    Patient's mental status is continued improved.  At this time, 11:30 AM , patient is awake and alert.  Her hypokalemia was treated.  Her hemoglobin is lower than her baseline however I do not think this is clinically significant..  She was able to speak to family members on  the telephone.  She feels like she is ready to go home.  We will make sure the patient can walk around safely.   Dorie Rank, MD 02/26/17 1131

## 2017-02-26 NOTE — Discharge Instructions (Signed)
Make sure to eat regularly and to monitor your blood sugar.  Follow-up with your primary care doctor as planned.  Return as needed for worsening symptoms

## 2017-02-26 NOTE — ED Notes (Signed)
Pt ambulated with normal gait.  Pt did showed no signs of distress.  RN aware.

## 2017-02-26 NOTE — ED Notes (Signed)
Bed: RESB Expected date:  Expected time:  Means of arrival:  Comments: Ems hyperglycemia

## 2017-02-26 NOTE — ED Notes (Signed)
Left message for son to return call regarding mothers discharge

## 2017-02-26 NOTE — ED Notes (Signed)
Spoke with son who states pts husband and care giver are on the way to the hospital to transport pt home.

## 2017-02-26 NOTE — ED Provider Notes (Signed)
[facility] DEPT Provider Note   CSN: [id number] Arrival date & time: 02/26/17  0522     History   Chief Complaint Chief Complaint  Patient presents with  . Hypoglycemia  Level 5 caveat due to altered mental status  HPI Melissa Myers is a 72 y.o. female.  The history is provided by the EMS personnel. The history is limited by the condition of the patient.  Hypoglycemia  Severity:  Severe Onset quality:  Sudden Timing:  Constant Progression:  Improving Chronicity:  New Diabetic status:  Controlled with insulin Ineffective treatments:  None tried Associated symptoms: altered mental status    Patient presents for altered mental status. Patient has a history of pancreatic cancer undergoing therapy, diabetes, obstructive sleep apnea, obesity on home oxygen presents with hypoglycemia Apparently in the night, she was noted to have a glucose of 600 Her home health nurse instructed her to take twice the amount of her insulin, which was 60 units of insulin total and her glucose dropped to the 50s Reportedly, it took EMS several hours to convince patient to come the emergency department She is now somnolent and unable to provide any history Past Medical History:  Diagnosis Date  . Anemia   . Cancer [facility])    Pancreatic  . Cellulitis    LOWER EXTREMITIES  . Chronic diarrhea    a/w nausea - felt related to IBS  . Deaf    left side only  . Diastolic CHF ([place])   . Disc degeneration, lumbar   . GERD (gastroesophageal reflux disease)   . Hyperlipidemia    hx rhabdo on statins  . Hypertension   . Neuropathy    feet, toes and fingers  . On home oxygen therapy    uses oxygen 2 liters min per [place] at night and prn during day  . OSA (obstructive sleep apnea)    05/2009 sleep study - refuses CPAP  . Osteoarthritis   . RLS (restless legs syndrome)   . Shortness of breath    chronic  . Stasis dermatitis   . Type II or unspecified type diabetes  mellitus without mention of complication, not stated as uncontrolled    insulin dep    Patient Active Problem List   Diagnosis Date Noted  . Port-A-Cath in place 02/01/2017  . Diarrhea 01/10/2017  . Left flank pain 01/04/2017  . Goals of care, counseling/discussion   . Malignant neoplasm of head of pancreas ([place])   . Cholangitis 05/05/2016  . Common bile duct stricture   . Hyperbilirubinemia 03/15/2016  . Diabetic peripheral neuropathy ([place]) 02/23/2016  . Murmur, cardiac 07/02/2015  . Chronic pain syndrome 05/05/2015  . MDD (major depressive disorder), single episode, severe ([place]) 05/05/2015  . Chronic anemia 04/14/2015  . IDDM (insulin dependent diabetes mellitus) ([place]) 03/19/2015  . Hyperlipidemia 12/23/2014  . Chronic diastolic heart failure ([place]) 06/01/2009  . Vitamin D deficiency 05/12/2009  . Sleep apnea 04/29/2009  . Dyslipidemia 04/20/2009  . Morbid obesity ([place]) 04/20/2009  . RESTLESS LEGS SYNDROME 04/20/2009  . Essential hypertension 04/20/2009    Past Surgical History:  Procedure Laterality Date  . BILIARY STENT PLACEMENT N/A 05/06/2016   Procedure: BILIARY STENT PLACEMENT;  Surgeon: Gatha Mayer, MD;  Location: [facility];  Service: Endoscopy;  Laterality: N/A;  . CHOLECYSTECTOMY  1997  . COLONOSCOPY N/A 12/03/2012   Procedure: COLONOSCOPY;  Surgeon: Lafayette Dragon, MD;  Location: [facility];  Service: Endoscopy;  Laterality: N/A;  . ENDOSCOPIC RETROGRADE  CHOLANGIOPANCREATOGRAPHY (ERCP) WITH PROPOFOL N/A 05/06/2016   Procedure: ENDOSCOPIC RETROGRADE CHOLANGIOPANCREATOGRAPHY (ERCP) WITH PROPOFOL;  Surgeon: Gatha Mayer, MD;  Location: [facility];  Service: Endoscopy;  Laterality: N/A;  . ERCP N/A 03/18/2016   Procedure: ENDOSCOPIC RETROGRADE CHOLANGIOPANCREATOGRAPHY (ERCP);  Surgeon: Irene Shipper, MD;  Location: [facility];  Service: Endoscopy;  Laterality: N/A;  . EUS N/A 04/28/2016   Procedure: UPPER ENDOSCOPIC ULTRASOUND (EUS) LINEAR;  Surgeon: [name], MD;  Location: [facility];  Service: Endoscopy;  Laterality: N/A;  . IR CHOLANGIOGRAM EXISTING TUBE  06/09/2016  . IR GENERIC HISTORICAL  05/07/2016   IR BILIARY DRAIN PLACEMENT WITH CHOLANGIOGRAM 05/07/2016 Jacqulynn Cadet, MD [facility]-INTER[facility]  . IR GENERIC HISTORICAL  05/19/2016   IR BILIARY STENT(S) EXISTING ACCESS INC DILATION CATH EXCHANGE 05/19/2016 Jacqulynn Cadet, MD [facility]-INTER[facility] RAD  . TONSILLECTOMY  1970  . TUBAL LIGATION  1980  . UMBILICAL HERNIA REPAIR  1995  . uterine polyp removal  2008    OB History    No data available       Home Medications    Prior to Admission medications   Medication Sig Start Date End Date Taking? Authorizing Provider  aspirin EC 81 MG tablet Take 1 tablet (81 mg total) by mouth daily. 07/08/14   Rowe Clack, MD  carvedilol (COREG) 25 MG tablet TAKE ONE TABLET TWICE DAILY WITH FOOD 01/25/17   Larey Dresser, MD  Cholecalciferol (VITAMIN D3) 2000 units capsule Take 1 capsule (2,000 Units total) by mouth daily. 06/17/15   Janith Lima, MD  Continuous Blood Gluc Sensor ([place]) MISC Use to check sugars 12/30/16   [name], MD  diphenoxylate-atropine (LOMOTIL) 2.5-0.025 MG tablet Take 1-2 tablets by mouth 4 (four) times daily as needed for diarrhea or loose stools. 02/01/17   Truitt Merle, MD  gabapentin (NEURONTIN) 300 MG capsule TAKE TWO CAPSULES FOUR TIMES DAILY 11/14/16   [name], MD  hydrALAZINE (APRESOLINE) 25 MG tablet TAKE 1 TABLET (25 MG TOTAL) BY MOUTH 3 (THREE) TIMES DAILY. 12/07/16   Larey Dresser, MD  insulin aspart (NOVOLOG) 100 UNIT/ML injection Inject 0-20 Units into the skin 3 (three) times daily with meals. . CBG 70 - 120: 0 units CBG 121 - 150: 3 units CBG 151 - 200: 4 units CBG 201 - 250: 7 units CBG 251 - 300: 11 units CBG 301 - 350: 15 units CBG 351 - 400: 20 units 03/21/16   Mariel Aloe, MD  insulin glargine (LANTUS) 100 UNIT/ML injection Inject 0.3 mLs (30 Units  total) into the skin daily. 03/22/16   Mariel Aloe, MD  lactulose (CHRONULAC) 10 GM/15ML solution Take 10 g by mouth daily as needed for mild constipation.    [provider]  lidocaine (LIDODERM) 5 % Place 1 patch daily onto the skin. Remove & Discard patch within 12 hours or as directed by MD 12/30/16   [name], MD  lidocaine-prilocaine (EMLA) cream Apply to port site prior to being accessed 09/20/16   [provider]  lipase/protease/amylase (CREON) 36000 UNITS CPEP capsule 2 po prior to snacks; 4 po prior to meals 09/20/16   [provider]  meclizine (ANTIVERT) 25 MG tablet Take 25 mg 2 (two) times daily as needed by mouth for dizziness.     [provider]  metFORMIN (GLUCOPHAGE) 500 MG tablet Take 500 mg by mouth daily with breakfast.    [provider]  mirtazapine (REMERON)  30 MG tablet Take 1 tablet (30 mg total) by mouth at bedtime. 01/10/17   Truitt Merle, MD  ondansetron (ZOFRAN) 8 MG tablet Take 1 tablet (8 mg total) by mouth every 8 (eight) hours as needed for nausea or vomiting. 01/10/17   Truitt Merle, MD  [facility] VERIO test strip USE AS INSTRUCTED UP TO 4 TIMES DAILY 11/03/16   [name], MD  oxyCODONE (OXY IR/ROXICODONE) 5 MG immediate release tablet Take 1 tablet (5 mg total) by mouth 2 (two) times daily as needed for severe pain. 01/10/17   Truitt Merle, MD  pregabalin (LYRICA) 75 MG capsule Take 1 capsule (75 mg total) daily by mouth. 12/30/16   [name], MD  rOPINIRole (REQUIP XL) 4 MG 24 hr tablet TAKE ONE TABLET AT BEDTIME Patient not taking: Reported on 02/22/2017 10/20/16   [name], MD  rOPINIRole (REQUIP) 2 MG tablet Take 1 tablet (2 mg total) by mouth 3 (three) times daily. 02/02/17   [name], MD  sertraline (ZOLOFT) 50 MG tablet Take 50 mg by mouth daily.    [provider]  silver sulfADIAZINE (SILVADENE) 1 % cream Apply 1 application daily topically. Patient not  taking: Reported on 02/22/2017 01/03/17   [name], MD  spironolactone (ALDACTONE) 25 MG tablet Take 1 tablet (25 mg total) by mouth daily. 06/17/16 11/24/17  Larey Dresser, MD  traMADol (ULTRAM) 50 MG tablet Take 1 tablet (50 mg total) by mouth every 6 (six) hours as needed. Patient taking differently: Take 50 mg every 6 (six) hours as needed by mouth for moderate pain.  11/03/16   [name], MD    Family History Family History  Problem Relation Age of Onset  . Heart disease Mother   . Heart attack Mother 46  . Heart disease Father   . Heart attack Father 16  . Heart disease Unknown        family history  . Stomach cancer Paternal Grandmother   . Lung cancer Paternal Grandfather   . CVA Unknown        several aunts  . Heart attack Unknown        several aunts and an uncle  . Colon cancer Neg Hx     Social History Social History   Tobacco Use  . Smoking status: Never Smoker  . Smokeless tobacco: Never Used  Substance Use Topics  . Alcohol use: No  . Drug use: No     Allergies   Morphine; Statins; Sulfa antibiotics; Cymbalta [duloxetine hcl]; Sulfasalazine; Levemir [insulin detemir]; and Zinc   Review of Systems Review of Systems  Unable to perform ROS: Mental status change     Physical Exam Updated Vital Signs BP (!) 106/52 (BP Location: Left Arm)   Pulse (!) 59   Temp (!) 97.5 F (36.4 C) (Oral)   Resp 16   Ht 1.651 m (5\' 5" )   Wt 93.9 kg (207 lb)   SpO2 95%   BMI 34.45 kg/m  Rectal temp equals 96 Physical Exam  CONSTITUTIONAL: Chronically ill-appearing, somnolent HEAD: Normocephalic/atraumatic EYES: EOMI, pupils are not pinpoint ENMT: Mucous membranes dry NECK: supple no meningeal signs CV: S1/S2 noted, no murmurs/rubs/gallops noted LUNGS: Lungs are clear to auscultation bilaterally, no apparent distress ABDOMEN: soft, nontender, obese NEURO: Pt is somnolent, but arouses to voice and deep pain.  She is able to move all  extremities x4 when awake  EXTREMITIES: pulses normal/equal, full ROM SKIN: warm, color normal  PSYCH: Unable to assess ED Treatments / Results  Labs (all labs ordered are listed, but only abnormal results are displayed) Labs Reviewed  CBC WITH DIFFERENTIAL/PLATELET - Abnormal; Notable for the following components:      Result Value   RBC 3.37 (*)    Hemoglobin 8.2 (*)    HCT 26.0 (*)    MCV 77.2 (*)    MCH 24.3 (*)    RDW 17.1 (*)    All other components within normal limits  URINALYSIS, ROUTINE W REFLEX MICROSCOPIC - Abnormal; Notable for the following components:   Glucose, UA 50 (*)    All other components within normal limits  BLOOD GAS, ARTERIAL - Abnormal; Notable for the following components:   pO2, Arterial 81.4 (*)    Bicarbonate 29.3 (*)    Acid-Base Excess 4.5 (*)    All other components within normal limits  CBG MONITORING, ED - Abnormal; Notable for the following components:   Glucose-Capillary 115 (*)    All other components within normal limits  CBG MONITORING, ED - Abnormal; Notable for the following components:   Glucose-Capillary 121 (*)    All other components within normal limits  BASIC METABOLIC PANEL  AMMONIA  HEPATIC FUNCTION PANEL  CBG MONITORING, ED    EKG  EKG Interpretation  Date/Time:  Sunday February 26 2017 06:36:01 EST Ventricular Rate:  56 PR Interval:    QRS Duration: 153 QT Interval:  494 QTC Calculation: 477 R Axis:   -122 Text Interpretation:  Sinus rhythm Right bundle branch block No significant change since last tracing Confirmed by Ripley Fraise [phone]) on 02/26/2017 6:44:13 AM       Radiology No results found.  Procedures Procedures  CRITICAL CARE Performed by: Sharyon Cable Total critical care time: 35 minutes Critical care time was exclusive of separately billable procedures and treating other patients. Critical care was necessary to treat or prevent imminent or life-threatening deterioration. Critical care  was time spent personally by me on the following activities: development of treatment plan with patient and/or surrogate as well as nursing, discussions with consultants, evaluation of patient's response to treatment, examination of patient, obtaining history from patient or surrogate, ordering and performing treatments and interventions, ordering and review of laboratory studies, ordering and review of radiographic studies, pulse oximetry and re-evaluation of patient's condition.   Medications Ordered in ED Medications  dextrose 50 % solution 25 mL (25 mLs Intravenous Not Given 02/26/17 9983)     Initial Impression / Assessment and Plan / ED Course  I have reviewed the triage vital signs and the nursing notes.  Pertinent labs results that were available during my care of the patient were reviewed by me and considered in my medical decision making (see chart for details).     6:29 AM Patient in the emergency department for hypoglycemia, this was an accidental overdose At this time, it is unclear what type of insulin was used Currently her glucose is above 100 However she is still somnolent. Could be multifactorial, will check ABG, electrolytes, urinalysis and chest x-ray She is also on multiple medications for pain as well as sleep medications which could be playing a role She will need to be monitored for several hours 6:44 AM Still somnolent. She is still arousable to voice and pain Hypothermia likely due to her recent hypoglycemia She is not hypercapnic 6:46 AM Review of chart reveals patient does take Lantus 30 units, suspect that she gave herself 60 units of Lantus at home  7:01 AM Signed out to Dr Lenna Sciara knapp with labs/imaging pending She may need to be admitted for further monitoring  Final Clinical Impressions(s) / ED Diagnoses   Final diagnoses:  None    ED Discharge Orders    None       Ripley Fraise, MD 02/26/17 [id number]

## 2017-02-27 LAB — BLOOD GAS, ARTERIAL
Acid-Base Excess: 4.5 mmol/L — ABNORMAL HIGH (ref 0.0–2.0)
BICARBONATE: 29.3 mmol/L — AB (ref 20.0–28.0)
Drawn by: 51425
FIO2: 21
O2 Saturation: 95.1 %
PATIENT TEMPERATURE: 98.6
PCO2 ART: 48 mmHg (ref 32.0–48.0)
PO2 ART: 81.4 mmHg — AB (ref 83.0–108.0)
pH, Arterial: 7.403 (ref 7.350–7.450)

## 2017-02-28 ENCOUNTER — Encounter: Payer: Self-pay | Admitting: Genetic Counselor

## 2017-02-28 DIAGNOSIS — E7849 Other hyperlipidemia: Secondary | ICD-10-CM | POA: Diagnosis not present

## 2017-02-28 DIAGNOSIS — I10 Essential (primary) hypertension: Secondary | ICD-10-CM | POA: Diagnosis not present

## 2017-02-28 DIAGNOSIS — G894 Chronic pain syndrome: Secondary | ICD-10-CM | POA: Diagnosis not present

## 2017-02-28 DIAGNOSIS — Z1389 Encounter for screening for other disorder: Secondary | ICD-10-CM | POA: Diagnosis not present

## 2017-02-28 DIAGNOSIS — E440 Moderate protein-calorie malnutrition: Secondary | ICD-10-CM | POA: Diagnosis not present

## 2017-02-28 DIAGNOSIS — D126 Benign neoplasm of colon, unspecified: Secondary | ICD-10-CM | POA: Diagnosis not present

## 2017-02-28 DIAGNOSIS — J439 Emphysema, unspecified: Secondary | ICD-10-CM | POA: Diagnosis not present

## 2017-02-28 DIAGNOSIS — C250 Malignant neoplasm of head of pancreas: Secondary | ICD-10-CM | POA: Diagnosis not present

## 2017-02-28 DIAGNOSIS — N08 Glomerular disorders in diseases classified elsewhere: Secondary | ICD-10-CM | POA: Diagnosis not present

## 2017-02-28 DIAGNOSIS — E1142 Type 2 diabetes mellitus with diabetic polyneuropathy: Secondary | ICD-10-CM | POA: Diagnosis not present

## 2017-02-28 DIAGNOSIS — E1129 Type 2 diabetes mellitus with other diabetic kidney complication: Secondary | ICD-10-CM | POA: Diagnosis not present

## 2017-02-28 DIAGNOSIS — Z6841 Body Mass Index (BMI) 40.0 and over, adult: Secondary | ICD-10-CM | POA: Diagnosis not present

## 2017-03-01 ENCOUNTER — Inpatient Hospital Stay: Payer: PPO | Attending: Hematology

## 2017-03-01 ENCOUNTER — Inpatient Hospital Stay: Payer: PPO

## 2017-03-01 ENCOUNTER — Other Ambulatory Visit: Payer: Self-pay | Admitting: Hematology

## 2017-03-01 VITALS — BP 127/74 | HR 55 | Temp 97.7°F | Resp 17 | Wt 197.0 lb

## 2017-03-01 DIAGNOSIS — I2510 Atherosclerotic heart disease of native coronary artery without angina pectoris: Secondary | ICD-10-CM | POA: Insufficient documentation

## 2017-03-01 DIAGNOSIS — I5030 Unspecified diastolic (congestive) heart failure: Secondary | ICD-10-CM | POA: Insufficient documentation

## 2017-03-01 DIAGNOSIS — K219 Gastro-esophageal reflux disease without esophagitis: Secondary | ICD-10-CM | POA: Insufficient documentation

## 2017-03-01 DIAGNOSIS — E46 Unspecified protein-calorie malnutrition: Secondary | ICD-10-CM | POA: Insufficient documentation

## 2017-03-01 DIAGNOSIS — C250 Malignant neoplasm of head of pancreas: Secondary | ICD-10-CM

## 2017-03-01 DIAGNOSIS — Z5111 Encounter for antineoplastic chemotherapy: Secondary | ICD-10-CM | POA: Diagnosis not present

## 2017-03-01 DIAGNOSIS — I700 Atherosclerosis of aorta: Secondary | ICD-10-CM | POA: Insufficient documentation

## 2017-03-01 DIAGNOSIS — R202 Paresthesia of skin: Secondary | ICD-10-CM | POA: Insufficient documentation

## 2017-03-01 DIAGNOSIS — K449 Diaphragmatic hernia without obstruction or gangrene: Secondary | ICD-10-CM | POA: Diagnosis not present

## 2017-03-01 DIAGNOSIS — R197 Diarrhea, unspecified: Secondary | ICD-10-CM | POA: Diagnosis not present

## 2017-03-01 DIAGNOSIS — G629 Polyneuropathy, unspecified: Secondary | ICD-10-CM | POA: Diagnosis not present

## 2017-03-01 DIAGNOSIS — Z7982 Long term (current) use of aspirin: Secondary | ICD-10-CM | POA: Insufficient documentation

## 2017-03-01 DIAGNOSIS — I517 Cardiomegaly: Secondary | ICD-10-CM | POA: Insufficient documentation

## 2017-03-01 DIAGNOSIS — M79604 Pain in right leg: Secondary | ICD-10-CM | POA: Diagnosis not present

## 2017-03-01 DIAGNOSIS — R599 Enlarged lymph nodes, unspecified: Secondary | ICD-10-CM | POA: Diagnosis not present

## 2017-03-01 DIAGNOSIS — J90 Pleural effusion, not elsewhere classified: Secondary | ICD-10-CM | POA: Insufficient documentation

## 2017-03-01 DIAGNOSIS — E785 Hyperlipidemia, unspecified: Secondary | ICD-10-CM | POA: Insufficient documentation

## 2017-03-01 DIAGNOSIS — Z9049 Acquired absence of other specified parts of digestive tract: Secondary | ICD-10-CM | POA: Insufficient documentation

## 2017-03-01 DIAGNOSIS — I10 Essential (primary) hypertension: Secondary | ICD-10-CM | POA: Insufficient documentation

## 2017-03-01 DIAGNOSIS — M5136 Other intervertebral disc degeneration, lumbar region: Secondary | ICD-10-CM | POA: Insufficient documentation

## 2017-03-01 DIAGNOSIS — Z95828 Presence of other vascular implants and grafts: Secondary | ICD-10-CM

## 2017-03-01 DIAGNOSIS — M79605 Pain in left leg: Secondary | ICD-10-CM | POA: Insufficient documentation

## 2017-03-01 DIAGNOSIS — Z9221 Personal history of antineoplastic chemotherapy: Secondary | ICD-10-CM | POA: Insufficient documentation

## 2017-03-01 DIAGNOSIS — I872 Venous insufficiency (chronic) (peripheral): Secondary | ICD-10-CM | POA: Insufficient documentation

## 2017-03-01 DIAGNOSIS — G4730 Sleep apnea, unspecified: Secondary | ICD-10-CM | POA: Insufficient documentation

## 2017-03-01 DIAGNOSIS — J432 Centrilobular emphysema: Secondary | ICD-10-CM | POA: Diagnosis not present

## 2017-03-01 DIAGNOSIS — M1990 Unspecified osteoarthritis, unspecified site: Secondary | ICD-10-CM | POA: Insufficient documentation

## 2017-03-01 DIAGNOSIS — Z794 Long term (current) use of insulin: Secondary | ICD-10-CM | POA: Insufficient documentation

## 2017-03-01 DIAGNOSIS — D649 Anemia, unspecified: Secondary | ICD-10-CM | POA: Insufficient documentation

## 2017-03-01 DIAGNOSIS — E119 Type 2 diabetes mellitus without complications: Secondary | ICD-10-CM | POA: Insufficient documentation

## 2017-03-01 DIAGNOSIS — Z79899 Other long term (current) drug therapy: Secondary | ICD-10-CM | POA: Insufficient documentation

## 2017-03-01 DIAGNOSIS — L0390 Cellulitis, unspecified: Secondary | ICD-10-CM | POA: Diagnosis not present

## 2017-03-01 DIAGNOSIS — Z9981 Dependence on supplemental oxygen: Secondary | ICD-10-CM | POA: Insufficient documentation

## 2017-03-01 LAB — CBC WITH DIFFERENTIAL/PLATELET
Abs Granulocyte: 3.9 10*3/uL (ref 1.5–6.5)
BASOS ABS: 0 10*3/uL (ref 0.0–0.1)
BASOS PCT: 0 %
EOS ABS: 0 10*3/uL (ref 0.0–0.5)
EOS PCT: 0 %
HCT: 26.3 % — ABNORMAL LOW (ref 34.8–46.6)
Hemoglobin: 8.5 g/dL — ABNORMAL LOW (ref 11.6–15.9)
LYMPHS ABS: 1.4 10*3/uL (ref 0.9–3.3)
Lymphocytes Relative: 25 %
MCH: 23.8 pg — AB (ref 25.1–34.0)
MCHC: 32.2 g/dL (ref 31.5–36.0)
MCV: 74 fL — ABNORMAL LOW (ref 79.5–101.0)
Monocytes Absolute: 0.3 10*3/uL (ref 0.1–0.9)
Monocytes Relative: 5 %
NEUTROS PCT: 70 %
Neutro Abs: 3.9 10*3/uL (ref 1.5–6.5)
Platelets: 219 10*3/uL (ref 145–400)
RBC: 3.55 MIL/uL — ABNORMAL LOW (ref 3.70–5.45)
RDW: 18 % — ABNORMAL HIGH (ref 11.2–16.1)
WBC: 5.6 10*3/uL (ref 3.9–10.3)

## 2017-03-01 LAB — COMPREHENSIVE METABOLIC PANEL
ALT: 23 U/L (ref 0–55)
ANION GAP: 10 (ref 3–11)
AST: 29 U/L (ref 5–34)
Albumin: 2.4 g/dL — ABNORMAL LOW (ref 3.5–5.0)
Alkaline Phosphatase: 344 U/L — ABNORMAL HIGH (ref 40–150)
BUN: 26 mg/dL (ref 7–26)
CO2: 29 mmol/L (ref 22–29)
Calcium: 8.9 mg/dL (ref 8.4–10.4)
Chloride: 97 mmol/L — ABNORMAL LOW (ref 98–109)
Creatinine, Ser: 0.99 mg/dL (ref 0.60–1.10)
GFR calc non Af Amer: 56 mL/min — ABNORMAL LOW (ref >60)
Glucose, Bld: 223 mg/dL — ABNORMAL HIGH (ref 70–140)
Potassium: 3.4 mmol/L (ref 3.3–4.7)
SODIUM: 136 mmol/L (ref 136–145)
Total Bilirubin: 0.5 mg/dL (ref 0.2–1.2)
Total Protein: 7 g/dL (ref 6.4–8.3)

## 2017-03-01 MED ORDER — SODIUM CHLORIDE 0.9 % IV SOLN
Freq: Once | INTRAVENOUS | Status: AC
  Administered 2017-03-01: 15:00:00 via INTRAVENOUS

## 2017-03-01 MED ORDER — PROCHLORPERAZINE MALEATE 10 MG PO TABS
ORAL_TABLET | ORAL | Status: AC
Start: 2017-03-01 — End: 2017-03-01
  Filled 2017-03-01: qty 1

## 2017-03-01 MED ORDER — SODIUM CHLORIDE 0.9% FLUSH
10.0000 mL | INTRAVENOUS | Status: DC | PRN
  Administered 2017-03-01: 10 mL
  Filled 2017-03-01: qty 10

## 2017-03-01 MED ORDER — SODIUM CHLORIDE 0.9% FLUSH
10.0000 mL | INTRAVENOUS | Status: DC | PRN
  Administered 2017-03-01: 10 mL via INTRAVENOUS
  Filled 2017-03-01: qty 10

## 2017-03-01 MED ORDER — PROCHLORPERAZINE MALEATE 10 MG PO TABS
10.0000 mg | ORAL_TABLET | Freq: Once | ORAL | Status: AC
  Administered 2017-03-01: 10 mg via ORAL

## 2017-03-01 MED ORDER — SODIUM CHLORIDE 0.9 % IV SOLN
2000.0000 mg | Freq: Once | INTRAVENOUS | Status: AC
  Administered 2017-03-01: 2000 mg via INTRAVENOUS
  Filled 2017-03-01: qty 52.6

## 2017-03-01 MED ORDER — HEPARIN SOD (PORK) LOCK FLUSH 100 UNIT/ML IV SOLN
500.0000 [IU] | Freq: Once | INTRAVENOUS | Status: AC | PRN
  Administered 2017-03-01: 500 [IU]
  Filled 2017-03-01: qty 5

## 2017-03-01 NOTE — Patient Instructions (Signed)
[facility] °Discharge Instructions for Patients Receiving Chemotherapy ° °Today you received the following chemotherapy agents Gemzar ° °To help prevent nausea and vomiting after your treatment, we encourage you to take your nausea medication as directed. °  °If you develop nausea and vomiting that is not controlled by your nausea medication, call the clinic.  ° °BELOW ARE SYMPTOMS THAT SHOULD BE REPORTED IMMEDIATELY: °· *FEVER GREATER THAN 100.5 F °· *CHILLS WITH OR WITHOUT FEVER °· NAUSEA AND VOMITING THAT IS NOT CONTROLLED WITH YOUR NAUSEA MEDICATION °· *UNUSUAL SHORTNESS OF BREATH °· *UNUSUAL BRUISING OR BLEEDING °· TENDERNESS IN MOUTH AND THROAT WITH OR WITHOUT PRESENCE OF ULCERS °· *URINARY PROBLEMS °· *BOWEL PROBLEMS °· UNUSUAL RASH °Items with * indicate a potential emergency and should be followed up as soon as possible. ° °Feel free to call the clinic should you have any questions or concerns. The clinic phone number is [phone]. ° °Please show the CHEMO ALERT CARD at check-in to the Emergency Department and triage nurse. ° ° °

## 2017-03-01 NOTE — Progress Notes (Signed)
OK to treat today despite elevated Alk Phos & no blood today with hgb 8.5. Per DR Burr Medico. Pt denies bleeding or SOB.[name]

## 2017-03-02 LAB — CANCER ANTIGEN 19-9: CA 19-9: 15 U/mL (ref 0–35)

## 2017-03-07 DIAGNOSIS — H25013 Cortical age-related cataract, bilateral: Secondary | ICD-10-CM | POA: Diagnosis not present

## 2017-03-07 DIAGNOSIS — H2513 Age-related nuclear cataract, bilateral: Secondary | ICD-10-CM | POA: Diagnosis not present

## 2017-03-07 DIAGNOSIS — H02839 Dermatochalasis of unspecified eye, unspecified eyelid: Secondary | ICD-10-CM | POA: Diagnosis not present

## 2017-03-07 DIAGNOSIS — H25043 Posterior subcapsular polar age-related cataract, bilateral: Secondary | ICD-10-CM | POA: Diagnosis not present

## 2017-03-09 ENCOUNTER — Ambulatory Visit: Payer: Self-pay

## 2017-03-09 ENCOUNTER — Telehealth: Payer: Self-pay | Admitting: Internal Medicine

## 2017-03-09 ENCOUNTER — Encounter: Payer: Self-pay | Admitting: Internal Medicine

## 2017-03-09 ENCOUNTER — Ambulatory Visit (INDEPENDENT_AMBULATORY_CARE_PROVIDER_SITE_OTHER): Payer: PPO | Admitting: Internal Medicine

## 2017-03-09 DIAGNOSIS — G894 Chronic pain syndrome: Secondary | ICD-10-CM | POA: Diagnosis not present

## 2017-03-09 DIAGNOSIS — I872 Venous insufficiency (chronic) (peripheral): Secondary | ICD-10-CM

## 2017-03-09 NOTE — Telephone Encounter (Signed)
Patient's caregiver called in to report redness and warmth to the patient's lower extremities, patient got on the phone and answered some questions, then gave permission for me to speak with her caregiver and husband. Reported that she has chronic swelling to her legs, but the redness and heat is new since yesterday. The caregiver said "the redness goes from her feet all the way up to her knee on one leg only." I asked can the patient walk, she says "with a walker real slow." The patient says her pain is a 6-7 on pain scale. She denies SOB, chest pain. According to the protocol, see PCP within 4 hours, appointment made for today, care advice given, verbalized understanding.  Reason for Disposition . [1] Red area or streak [2] large (> 2 in. or 5 cm)  Answer Assessment - Initial Assessment Questions 1. ONSET: "When did the swelling start?" (e.g., minutes, hours, days)     Always swollen 2. LOCATION: "What part of the leg is swollen?"  "Are both legs swollen or just one leg?"    Both legs 3. SEVERITY: "How bad is the swelling?" (e.g., localized; mild, moderate, severe)  - Localized - small area of swelling localized to one leg  - MILD pedal edema - swelling limited to foot and ankle, pitting edema < 1/4 inch (6 mm) deep, rest and elevation eliminate most or all swelling  - MODERATE edema - swelling of lower leg to knee, pitting edema > 1/4 inch (6 mm) deep, rest and elevation only partially reduce swelling  - SEVERE edema - swelling extends above knee, facial or hand swelling present      Moderate 4. REDNESS: "Does the swelling look red or infected?"     Yes with heat 5. PAIN: "Is the swelling painful to touch?" If so, ask: "How painful is it?"   (Scale 1-10; mild, moderate or severe)     5-6 6. FEVER: "Do you have a fever?" If so, ask: "What is it, how was it measured, and when did it start?"      No 7. CAUSE: "What do you think is causing the leg swelling?"     Chronic swelling 8. MEDICAL  HISTORY: "Do you have a history of heart failure, kidney disease, liver failure, or cancer?"     Pancreatic cancer, heart failure 9. RECURRENT SYMPTOM: "Have you had leg swelling before?" If so, ask: "When was the last time?" "What happened that time?"     Yes, swollen all the time 10. OTHER SYMPTOMS: "Do you have any other symptoms?" (e.g., chest pain, difficulty breathing)       No 11. PREGNANCY: "Is there any chance you are pregnant?" "When was your last menstrual period?"       No  Protocols used: LEG SWELLING AND EDEMA-A-AH

## 2017-03-09 NOTE — Telephone Encounter (Signed)
Tried calling Olivia Mackie back but no answer, informed her I would send this note over to Dr. Sharlet Salina

## 2017-03-09 NOTE — Patient Instructions (Signed)
We will refill the oxycodone today.  We have sent in an ointment to use on your legs to help prevent them from being infected.

## 2017-03-09 NOTE — Telephone Encounter (Signed)
Noted  

## 2017-03-09 NOTE — Telephone Encounter (Signed)
Copied from [place] [id number]. Topic: Quick Communication - See Telephone Encounter >> Mar 09, 2017  2:14 PM Robina Ade, Leone Payor wrote: Olivia Mackie from [facility] called and wanted to let the provider know about a medication not working for patient for her visit today. She is taking torsemide 1/2 tab a day and potassium 1 tab a day. Patient's legs are red and she has not been able to loose weight but is gaining instead. Linus Orn would like to talk to Dr. Sharlet Salina or her CMA about this. She can be reached at [phone]. CRM for notification. See Telephone encounter for: 03/09/17.

## 2017-03-10 DIAGNOSIS — I872 Venous insufficiency (chronic) (peripheral): Secondary | ICD-10-CM | POA: Insufficient documentation

## 2017-03-10 MED ORDER — OXYCODONE HCL 5 MG PO TABS: 5.0000 mg | ORAL_TABLET | Freq: Two times a day (BID) | ORAL | 0 refills | Status: DC | PRN

## 2017-03-10 MED ORDER — MUPIROCIN 2 % EX OINT: 1.0000 "application " | TOPICAL_OINTMENT | Freq: Every day | CUTANEOUS | 0 refills | Status: DC

## 2017-03-10 NOTE — Progress Notes (Signed)
   Subjective:    Patient ID: Melissa Myers, female    DOB: 1945/07/02, 72 y.o.   MRN: [id number]  HPI The patient is a 72 YO female coming in for right leg heat and swelling. She does have chronic venous insufficiency. She does not wear compression stockings due to pain. She is currently undergoing chemo for her pancreatic cancer. This is palliative in nature. She is not very active but is able to walk. The swelling is about the same as usual. Slightly worse in the right than left. Some pain with walking. She has used her tramadol daily and rare oxycodone for pain. She is taking her lyrica and requip still which help the pain. She denies extended travel or incapacitation recently. She is doing well with the chemo and denies many side effects except diarrhea.   Review of Systems  Constitutional: Negative for activity change and appetite change.  HENT: Negative.   Eyes: Negative.   Respiratory: Negative for cough, chest tightness and shortness of breath.   Cardiovascular: Positive for leg swelling. Negative for chest pain and palpitations.  Gastrointestinal: Negative for abdominal distention, abdominal pain, constipation, diarrhea, nausea and vomiting.  Musculoskeletal: Positive for arthralgias and myalgias.  Skin: Negative.   Neurological: Negative.   Psychiatric/Behavioral: Negative.       Objective:   Physical Exam  Constitutional: She is oriented to person, place, and time. She appears well-developed and well-nourished.  overweight  HENT:  Head: Normocephalic and atraumatic.  Eyes: EOM are normal.  Neck: Normal range of motion.  Cardiovascular: Normal rate and regular rhythm.  Pulmonary/Chest: Effort normal and breath sounds normal. No respiratory distress. She has no wheezes. She has no rales.  Abdominal: Soft. Bowel sounds are normal. She exhibits no distension. There is no tenderness. There is no rebound.  Musculoskeletal: She exhibits edema and tenderness.  Most tenderness  is in her feet, the calves are without significant pain to palpation and swelling is actually improved from prior, no indication of cellulitis or DVT  Neurological: She is alert and oriented to person, place, and time. Coordination abnormal.  Wheelchair for long distances  Skin: Skin is warm and dry.  Psychiatric: She has a normal mood and affect.   Vitals:   03/09/17 1550  BP: 118/68  Pulse: 63  Temp: 98 F (36.7 C)  TempSrc: Oral  SpO2: 98%  Weight: 198 lb (89.8 kg)  Height: 5\' 5"  (1.651 m)      Assessment & Plan:

## 2017-03-10 NOTE — Assessment & Plan Note (Addendum)
No indication for cellulitis or DVT on exam. Given that she currently has active cancer this does increase her risk for DVT. We discussed that the only way to know for sure would be doppler of the legs to check. They decline this today and just wanted reassurance that they were not infected. Bactroban ointment sent in for dry skin as well as decontamination as there are several fissures in the skin and feet which are high risk to get infected in the future.

## 2017-03-10 NOTE — Assessment & Plan Note (Signed)
Refill her oxycodone for her cancer related pain. #60 no refills.

## 2017-03-14 ENCOUNTER — Inpatient Hospital Stay: Payer: PPO

## 2017-03-14 ENCOUNTER — Other Ambulatory Visit: Payer: PPO

## 2017-03-14 ENCOUNTER — Telehealth: Payer: Self-pay | Admitting: Hematology

## 2017-03-14 ENCOUNTER — Encounter: Payer: Self-pay | Admitting: Nurse Practitioner

## 2017-03-14 ENCOUNTER — Inpatient Hospital Stay (HOSPITAL_BASED_OUTPATIENT_CLINIC_OR_DEPARTMENT_OTHER): Payer: PPO | Admitting: Nurse Practitioner

## 2017-03-14 VITALS — BP 153/67 | HR 73 | Temp 97.8°F | Resp 24 | Ht 65.0 in | Wt 202.2 lb

## 2017-03-14 DIAGNOSIS — I2510 Atherosclerotic heart disease of native coronary artery without angina pectoris: Secondary | ICD-10-CM

## 2017-03-14 DIAGNOSIS — M79604 Pain in right leg: Secondary | ICD-10-CM | POA: Diagnosis not present

## 2017-03-14 DIAGNOSIS — Z95828 Presence of other vascular implants and grafts: Secondary | ICD-10-CM

## 2017-03-14 DIAGNOSIS — G629 Polyneuropathy, unspecified: Secondary | ICD-10-CM

## 2017-03-14 DIAGNOSIS — M1990 Unspecified osteoarthritis, unspecified site: Secondary | ICD-10-CM

## 2017-03-14 DIAGNOSIS — R197 Diarrhea, unspecified: Secondary | ICD-10-CM

## 2017-03-14 DIAGNOSIS — L0390 Cellulitis, unspecified: Secondary | ICD-10-CM

## 2017-03-14 DIAGNOSIS — C250 Malignant neoplasm of head of pancreas: Secondary | ICD-10-CM

## 2017-03-14 DIAGNOSIS — Z9981 Dependence on supplemental oxygen: Secondary | ICD-10-CM

## 2017-03-14 DIAGNOSIS — I872 Venous insufficiency (chronic) (peripheral): Secondary | ICD-10-CM

## 2017-03-14 DIAGNOSIS — R202 Paresthesia of skin: Secondary | ICD-10-CM | POA: Diagnosis not present

## 2017-03-14 DIAGNOSIS — M79605 Pain in left leg: Secondary | ICD-10-CM

## 2017-03-14 DIAGNOSIS — E785 Hyperlipidemia, unspecified: Secondary | ICD-10-CM

## 2017-03-14 DIAGNOSIS — M5136 Other intervertebral disc degeneration, lumbar region: Secondary | ICD-10-CM

## 2017-03-14 DIAGNOSIS — E46 Unspecified protein-calorie malnutrition: Secondary | ICD-10-CM

## 2017-03-14 DIAGNOSIS — I517 Cardiomegaly: Secondary | ICD-10-CM

## 2017-03-14 DIAGNOSIS — R599 Enlarged lymph nodes, unspecified: Secondary | ICD-10-CM | POA: Diagnosis not present

## 2017-03-14 DIAGNOSIS — I700 Atherosclerosis of aorta: Secondary | ICD-10-CM | POA: Diagnosis not present

## 2017-03-14 DIAGNOSIS — E119 Type 2 diabetes mellitus without complications: Secondary | ICD-10-CM

## 2017-03-14 DIAGNOSIS — Z7982 Long term (current) use of aspirin: Secondary | ICD-10-CM

## 2017-03-14 DIAGNOSIS — K219 Gastro-esophageal reflux disease without esophagitis: Secondary | ICD-10-CM

## 2017-03-14 DIAGNOSIS — Z9049 Acquired absence of other specified parts of digestive tract: Secondary | ICD-10-CM

## 2017-03-14 DIAGNOSIS — Z794 Long term (current) use of insulin: Secondary | ICD-10-CM

## 2017-03-14 DIAGNOSIS — D649 Anemia, unspecified: Secondary | ICD-10-CM

## 2017-03-14 DIAGNOSIS — J90 Pleural effusion, not elsewhere classified: Secondary | ICD-10-CM | POA: Diagnosis not present

## 2017-03-14 DIAGNOSIS — K449 Diaphragmatic hernia without obstruction or gangrene: Secondary | ICD-10-CM | POA: Diagnosis not present

## 2017-03-14 DIAGNOSIS — J432 Centrilobular emphysema: Secondary | ICD-10-CM

## 2017-03-14 DIAGNOSIS — I5030 Unspecified diastolic (congestive) heart failure: Secondary | ICD-10-CM

## 2017-03-14 DIAGNOSIS — Z5111 Encounter for antineoplastic chemotherapy: Secondary | ICD-10-CM

## 2017-03-14 DIAGNOSIS — Z79899 Other long term (current) drug therapy: Secondary | ICD-10-CM

## 2017-03-14 DIAGNOSIS — G4730 Sleep apnea, unspecified: Secondary | ICD-10-CM

## 2017-03-14 DIAGNOSIS — I10 Essential (primary) hypertension: Secondary | ICD-10-CM

## 2017-03-14 DIAGNOSIS — IMO0001 Reserved for inherently not codable concepts without codable children: Secondary | ICD-10-CM

## 2017-03-14 DIAGNOSIS — Z9221 Personal history of antineoplastic chemotherapy: Secondary | ICD-10-CM

## 2017-03-14 LAB — CBC WITH DIFFERENTIAL/PLATELET
BASOS ABS: 0 10*3/uL (ref 0.0–0.1)
BASOS PCT: 0 %
EOS ABS: 0 10*3/uL (ref 0.0–0.5)
Eosinophils Relative: 1 %
HCT: 28.4 % — ABNORMAL LOW (ref 34.8–46.6)
HEMOGLOBIN: 8.7 g/dL — AB (ref 11.6–15.9)
LYMPHS ABS: 1.9 10*3/uL (ref 0.9–3.3)
Lymphocytes Relative: 36 %
MCH: 24.2 pg — AB (ref 25.1–34.0)
MCHC: 30.6 g/dL — ABNORMAL LOW (ref 31.5–36.0)
MCV: 79.1 fL — ABNORMAL LOW (ref 79.5–101.0)
Monocytes Absolute: 0.5 10*3/uL (ref 0.1–0.9)
Monocytes Relative: 9 %
NEUTROS PCT: 54 %
Neutro Abs: 2.8 10*3/uL (ref 1.5–6.5)
Platelets: 467 10*3/uL — ABNORMAL HIGH (ref 145–400)
RBC: 3.59 MIL/uL — AB (ref 3.70–5.45)
RDW: 19.6 % — ABNORMAL HIGH (ref 11.2–16.1)
WBC: 5.3 10*3/uL (ref 3.9–10.3)

## 2017-03-14 LAB — COMPREHENSIVE METABOLIC PANEL
ALT: 40 U/L (ref 0–55)
AST: 63 U/L — ABNORMAL HIGH (ref 5–34)
Albumin: 2.7 g/dL — ABNORMAL LOW (ref 3.5–5.0)
Alkaline Phosphatase: 340 U/L — ABNORMAL HIGH (ref 40–150)
Anion gap: 11 (ref 3–11)
BILIRUBIN TOTAL: 0.3 mg/dL (ref 0.2–1.2)
BUN: 32 mg/dL — ABNORMAL HIGH (ref 7–26)
CHLORIDE: 99 mmol/L (ref 98–109)
CO2: 26 mmol/L (ref 22–29)
CREATININE: 1.08 mg/dL (ref 0.60–1.10)
Calcium: 8.9 mg/dL (ref 8.4–10.4)
GFR calc non Af Amer: 50 mL/min — ABNORMAL LOW (ref >60)
GFR, EST AFRICAN AMERICAN: 58 mL/min — AB (ref >60)
Glucose, Bld: 356 mg/dL — ABNORMAL HIGH (ref 70–140)
Potassium: 3.9 mmol/L (ref 3.3–4.7)
Sodium: 136 mmol/L (ref 136–145)
TOTAL PROTEIN: 6.8 g/dL (ref 6.4–8.3)

## 2017-03-14 MED ORDER — HEPARIN SOD (PORK) LOCK FLUSH 100 UNIT/ML IV SOLN
500.0000 [IU] | Freq: Once | INTRAVENOUS | Status: AC | PRN
  Administered 2017-03-14: 500 [IU] via INTRAVENOUS
  Filled 2017-03-14: qty 5

## 2017-03-14 MED ORDER — SODIUM CHLORIDE 0.9% FLUSH
10.0000 mL | INTRAVENOUS | Status: DC | PRN
  Administered 2017-03-14: 10 mL via INTRAVENOUS
  Filled 2017-03-14: qty 10

## 2017-03-14 NOTE — Progress Notes (Signed)
[place]  [phone] [phone]  Clinic Follow up Note   Patient Care Team: [name], MD as PCP - General (Internal Medicine) Rigoberto Noel, MD as Consulting Physician (Pulmonary Disease) Larey Dresser, MD as Consulting Physician (Cardiology) Megan Salon, MD as Consulting Physician (Gynecology) Kristeen Miss, MD as Consulting Physician (Neurosurgery) Earlie Server, MD (Orthopedic Surgery) Almedia Balls, MD (Orthopedic Surgery) Lafayette Dragon, MD (Inactive) (Gastroenterology) Renato Shin, MD (Endocrinology) Darleen Crocker, MD (Ophthalmology) 03/14/2017  CHIEF COMPLAINT: Follow up pancreatic cancer   SUMMARY OF ONCOLOGIC HISTORY: Oncology History   Cancer Staging Malignant neoplasm of head of pancreas [facility]) Staging form: Exocrine Pancreas, AJCC 8th Edition - Clinical stage from 04/28/2016: Stage IB (cT2, cN0, cM0) - Signed by Truitt Merle, MD on 02/01/2017       Malignant neoplasm of head of pancreas ([place])   02/2016 Initial Diagnosis    Malignant neoplasm of head of pancreas ([place])      03/15/2016 - 03/21/2016 Hospital Admission    Presents with nausea, vomiting, and jaundice   Patient was found hyperbilirubinemic with abnormal abdominal CT suggesting obstructive physiology. Referred for admission and evaluation.      03/15/2016 Imaging    CT Abdomen Pelvis w Contrast 1. Cholecystectomy with borderline biliary duct dilatation. 2. Pancreatic atrophy with moderate pancreatic duct dilatation, followed to the level of the duodenum. 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 Considerations include otherwise occult stricture, periampullary lesion, or main duct intraductal papillary mucinous neoplasm. Given the combination of borderline biliary duct dilatation, moderate pancreatic duct dilatation, and the clinical history of jaundice, consider further evaluation with ERCP. If the patient is not a good ERCP candidate, MRCP  with and without contrast would be another option. 3.  No acute process in the abdomen or pelvis. 4.  Tiny hiatal hernia. 5.  Aortic atherosclerosis.      03/16/2016 Imaging    MRI Abdomen 1. Hypoenhancing lesion in the uncinate process of the pancreas, better demonstrated by MR than on the recent CT scan. The dilated main pancreatic duct and dilated common bile duct abruptly terminate at the cranial margin of this lesion. MR imaging features are concerning for pancreatic adenocarcinoma. ERCP with endoscopic ultrasound would likely prove helpful to further evaluate. 2. No definite metastatic disease in the liver. No peripancreatic lymphadenopathy is identified. Of note, this exam is limited by marked motion degradation and small liver lesions could be obscured. CT scan earlier today demonstrated no peripancreatic lymphadenopathy.      03/18/2016 Procedure    ERCP 1. Malignant-appearing distal bile duct stricturestatus post ERCP with sphincterotomy and plastic biliary stent placement 2. Guidewire finding false lumen with subsequentinjection as described 3. Status post cholecystectomy  Recommendation: 1. Clear liquid diet 2. Unasyn 1.5 g every 6 hours 3. Standard post ERCP observation. Indomethacin suppositories given 4. Trend LFTs 5. Will need endoscopic ultrasound with biopsy with Dr. Ardis Hughs outpatient.      04/28/2016 Pathology Results    Endoscopic [place] and Biopsy FINE NEEDLE ASPIRATION, ENDOSCOPIC, PANCREAS UNCINATE (SPECIMEN 1 OF 1 COLLECTED 04/28/16): MALIGNANT CELLS CONSISTENT WITH ADENOCARCINOMA.      05/04/2016 -  Hospital Admission    Presents with fever, abdominal pain, malaise, confusion. She was treated for acute cholangitis.       05/05/2016 Imaging    CT Abdomen Pelvis w Contrast Interval placement of a bile duct stent with some decompression of the bile ducts. Mild residual biliary dilatation. Pancreatic ductal dilatation and atrophy. Known pancreatic mass  lesion is not well depicted at CT. No evidence of bowel obstruction or inflammation.      05/05/2016 Imaging    CT Head w/o Contrast 05/05/16 IMPRESSION: No acute intracranial pathology.      05/06/2016 Procedure    ERCP and Bilary stent placement  Imaging demonstrating removal of endoscopic common bile duct stent. The stent was not able to be replaced.       05/07/2016 Imaging    CT Chest wo Contrast  1. New small right and trace left pleural effusions. Mild cardiomegaly. 2. Borderline enlarged right hilar lymph node at 1 cm in short axis. 3. No definite findings of metastatic disease to the chest. 4. Centrilobular emphysema. 5. High density in the visualized part of the dorsal pancreatic duct, probably contrast medium. 6. Subacute healing fracture of the right seventh rib laterally. 7. Atherosclerosis, including the left anterior descending coronary artery.      05/07/2016 Procedure    On 05/07/16, percutaneous transhepatic cholangiogram and placement of a 43F internal/external biliary drain was performed by IR with aspirated purulent bile sent for culture. Bile culture grew pantoea species, enterococcus faecalis, viridans streptococcus.      07/11/2016 - 07/21/2016 Radiation Therapy    Diagnosis:   cT2N0M0  adenocarcinoma of the pancreas  Indication for treatment:  Curative  Radiation treatment dates:   07/11/16 - 07/21/16  Site/dose:   Pancreas: 33 Gy in 5 fractions  Beams/energy:   SBRT/SRT-VMAT // 10X-FFF Photon  Narrative: The patient tolerated radiation treatment relatively well. The patient complained of diarrhea during treatment. She was instructed to purchase Imodium.      08/2016 - 10/2016 Chemotherapy    She had chemotherapy consistent of gemcitabine and abraxine for 2-3 months under the care of Dr. Berneice Gandy at [facility] with her last treatment being about 10/2016.        02/20/2017 Imaging    CT CAP 02/20/17  IMPRESSION: 1. Response to therapy of the primary  pancreatic head/uncinate process lesion. Please note that delineation of the tumor is difficult secondary to anatomic distortion from therapy. 2. No new or progressive disease identified. 3. Removal of biliary stent, with pneumobilia and borderline similar intrahepatic biliary duct dilatation. 4.  No acute process or evidence of metastatic disease in the chest. 5.  Possible constipation. 6. Coronary artery atherosclerosis. Aortic Atherosclerosis (ICD10-I70.0). 7.  Emphysema (ICD10-J43.9). 8. Pulmonary artery enlargement suggests pulmonary arterial hypertension.      02/22/2017 -  Chemotherapy    Single agent Gemcitabine every 2-3 weeks on and 1 week off, starting 02/22/17      CURRENT THERAPY:  single agent gemcitabine 2 weeks on and 1 week off starting 02/22/17   INTERVAL HISTORY: Ms. Treloar returns for follow-up today prior to next cycle of gemcitabine.  Last treated 03/01/2017.  Today she complains of bilateral leg pain and restless leg.  He has been sitting in wheelchair for a while which exacerbates these chronic symptoms.  Notes her short-term memory is decreased since being on chemo.  Eating and drinking well, no abdominal pain, nausea, vomiting, or constipation.  Has frequent diarrhea while on chemo especially in first few days after treatment.  Has had some instances of incontinence.  No BM today but has had 6 in the last 24 hours.  Taking Creon as directed, has not started Lomotil.  Blood sugar has been elevated recently ranging 250-320 at home and as high as 500 fasting periodically.  She notes her home nurse did not give her insulin injection  today; labs were nonfasting this morning.  She was taken to ED earlier this month for hypoglycemic episode after increased dose of insulin and she had altered mental status.  This has not happened subsequently.  Reports intermittent tingling to fingertips, chronic, and not impairing function.  REVIEW OF SYSTEMS:   Constitutional: Denies fevers,  chills or abnormal weight loss (+) fatigue  Eyes: Denies blurriness of vision Ears, nose, mouth, throat, and face: Denies mucositis or sore throat Respiratory: Denies cough, dyspnea or wheezes Cardiovascular: Denies palpitation, chest discomfort (+) lower extremity swelling Gastrointestinal:  Denies nausea, vomiting, constipation, heartburn or change in bowel habits (+) frequent diarrhea, x6 in 24 hours Skin: Denies abnormal skin rashes Lymphatics: Denies new lymphadenopathy or easy bruising Neurological:Denies numbness or new weaknesses (+) intermittent tingling to fingertips, chronic, and not impairing function (+) decreased short term memory  Behavioral/Psych: Mood is stable, no new changes  MSK: (+) chronic leg pain (+) restless leg  All other systems were reviewed with the patient and are negative.  MEDICAL HISTORY:  Past Medical History:  Diagnosis Date  . Anemia   . Cancer [facility])    Pancreatic  . Cellulitis    LOWER EXTREMITIES  . Chronic diarrhea    a/w nausea - felt related to IBS  . Deaf    left side only  . Diastolic CHF ([place])   . Disc degeneration, lumbar   . GERD (gastroesophageal reflux disease)   . Hyperlipidemia    hx rhabdo on statins  . Hypertension   . Neuropathy    feet, toes and fingers  . On home oxygen therapy    uses oxygen 2 liters min per [place] at night and prn during day  . OSA (obstructive sleep apnea)    05/2009 sleep study - refuses CPAP  . Osteoarthritis   . RLS (restless legs syndrome)   . Shortness of breath    chronic  . Stasis dermatitis   . Type II or unspecified type diabetes mellitus without mention of complication, not stated as uncontrolled    insulin dep    SURGICAL HISTORY: Past Surgical History:  Procedure Laterality Date  . BILIARY STENT PLACEMENT N/A 05/06/2016   Procedure: BILIARY STENT PLACEMENT;  Surgeon: Gatha Mayer, MD;  Location: [place];  Service: Endoscopy;  Laterality: N/A;  . CHOLECYSTECTOMY  1997  .  COLONOSCOPY N/A 12/03/2012   Procedure: COLONOSCOPY;  Surgeon: Lafayette Dragon, MD;  Location: [facility];  Service: Endoscopy;  Laterality: N/A;  . ENDOSCOPIC RETROGRADE CHOLANGIOPANCREATOGRAPHY (ERCP) WITH PROPOFOL N/A 05/06/2016   Procedure: ENDOSCOPIC RETROGRADE CHOLANGIOPANCREATOGRAPHY (ERCP) WITH PROPOFOL;  Surgeon: Gatha Mayer, MD;  Location: [place];  Service: Endoscopy;  Laterality: N/A;  . ERCP N/A 03/18/2016   Procedure: ENDOSCOPIC RETROGRADE CHOLANGIOPANCREATOGRAPHY (ERCP);  Surgeon: Irene Shipper, MD;  Location: [facility];  Service: Endoscopy;  Laterality: N/A;  . EUS N/A 04/28/2016   Procedure: UPPER ENDOSCOPIC ULTRASOUND (EUS) LINEAR;  Surgeon: [name], MD;  Location: [facility];  Service: Endoscopy;  Laterality: N/A;  . IR CHOLANGIOGRAM EXISTING TUBE  06/09/2016  . IR GENERIC HISTORICAL  05/07/2016   IR BILIARY DRAIN PLACEMENT WITH CHOLANGIOGRAM 05/07/2016 Jacqulynn Cadet, MD [facility]-INTER[facility]  . IR GENERIC HISTORICAL  05/19/2016   IR BILIARY STENT(S) EXISTING ACCESS INC DILATION CATH EXCHANGE 05/19/2016 Jacqulynn Cadet, MD [facility]-INTERV RAD  . TONSILLECTOMY  1970  . TUBAL LIGATION  1980  . UMBILICAL HERNIA REPAIR  1995  . uterine polyp removal  2008  I have reviewed the social history and family history with the patient and they are unchanged from previous note.  ALLERGIES:  is allergic to morphine; statins; sulfa antibiotics; cymbalta [duloxetine hcl]; sulfasalazine; levemir [insulin detemir]; and zinc.  MEDICATIONS:  Current Outpatient Medications  Medication Sig Dispense Refill  . aspirin EC 81 MG tablet Take 1 tablet (81 mg total) by mouth daily.    . carvedilol (COREG) 25 MG tablet TAKE ONE TABLET TWICE DAILY WITH FOOD 60 tablet 5  . Cholecalciferol (VITAMIN D3) 2000 units capsule Take 1 capsule (2,000 Units total) by mouth daily. 90 capsule 3  . Continuous Blood Gluc Sensor (FREESTYLE LIBRE SENSOR SYSTEM) MISC Use to check sugars 1 each 0  .  diphenoxylate-atropine (LOMOTIL) 2.5-0.025 MG tablet Take 1-2 tablets by mouth 4 (four) times daily as needed for diarrhea or loose stools. 120 tablet 2  . gabapentin (NEURONTIN) 300 MG capsule TAKE TWO CAPSULES FOUR TIMES DAILY 240 capsule 1  . hydrALAZINE (APRESOLINE) 25 MG tablet TAKE 1 TABLET (25 MG TOTAL) BY MOUTH 3 (THREE) TIMES DAILY. 90 tablet 3  . insulin aspart (NOVOLOG) 100 UNIT/ML injection Inject 0-20 Units into the skin 3 (three) times daily with meals. . CBG 70 - 120: 0 units CBG 121 - 150: 3 units CBG 151 - 200: 4 units CBG 201 - 250: 7 units CBG 251 - 300: 11 units CBG 301 - 350: 15 units CBG 351 - 400: 20 units    . insulin glargine (LANTUS) 100 UNIT/ML injection Inject 0.3 mLs (30 Units total) into the skin daily.    [name] lactulose (CHRONULAC) 10 GM/15ML solution Take 10 g by mouth daily as needed for mild constipation.    . lidocaine (LIDODERM) 5 % Place 1 patch daily onto the skin. Remove & Discard patch within 12 hours or as directed by MD 30 patch 0  . lidocaine-prilocaine (EMLA) cream Apply to port site prior to being accessed    . lipase/protease/amylase (CREON) 36000 UNITS CPEP capsule 2 po prior to snacks; 4 po prior to meals    . meclizine (ANTIVERT) 25 MG tablet Take 25 mg 2 (two) times daily as needed by mouth for dizziness.     . metFORMIN (GLUCOPHAGE) 500 MG tablet Take 500 mg by mouth daily with breakfast.    . mirtazapine (REMERON) 30 MG tablet Take 1 tablet (30 mg total) by mouth at bedtime. 30 tablet 2  . mupirocin ointment (BACTROBAN) 2 % Apply 1 application topically daily. 90 g 0  . ondansetron (ZOFRAN) 8 MG tablet Take 1 tablet (8 mg total) by mouth every 8 (eight) hours as needed for nausea or vomiting. 30 tablet 2  . ONETOUCH VERIO test strip USE AS INSTRUCTED UP TO 4 TIMES DAILY 200 each 11  . oxyCODONE (OXY IR/ROXICODONE) 5 MG immediate release tablet Take 1 tablet (5 mg total) by mouth 2 (two) times daily as needed for severe pain. 60 tablet 0  .  pregabalin (LYRICA) 75 MG capsule Take 1 capsule (75 mg total) daily by mouth. 30 capsule 3  . rOPINIRole (REQUIP XL) 4 MG 24 hr tablet TAKE ONE TABLET AT BEDTIME 30 tablet 5  . rOPINIRole (REQUIP) 2 MG tablet Take 1 tablet (2 mg total) by mouth 3 (three) times daily. 90 tablet 2  . sertraline (ZOLOFT) 50 MG tablet Take 50 mg by mouth daily.    . silver sulfADIAZINE (SILVADENE) 1 % cream Apply 1 application daily topically. 50 g 0  . spironolactone (ALDACTONE)  25 MG tablet Take 1 tablet (25 mg total) by mouth daily. 90 tablet 3  . traMADol (ULTRAM) 50 MG tablet Take 1 tablet (50 mg total) by mouth every 6 (six) hours as needed. (Patient taking differently: Take 50 mg every 6 (six) hours as needed by mouth for moderate pain. ) 60 tablet 0   Current Facility-Administered Medications  Medication Dose Route Frequency Provider Last Rate Last Dose  . sodium chloride flush (NS) 0.9 % injection 10 mL  10 mL Intravenous PRN Truitt Merle, MD   10 mL at 03/14/17 1359   Facility-Administered Medications Ordered in Other Visits  Medication Dose Route Frequency Provider Last Rate Last Dose  . sodium chloride flush (NS) 0.9 % injection 10 mL  10 mL Intracatheter PRN Truitt Merle, MD   10 mL at 02/22/17 1729    PHYSICAL EXAMINATION: ECOG PERFORMANCE STATUS: 3 - Symptomatic, >50% confined to bed  Vitals:   03/14/17 1307  BP: (!) 153/67  Pulse: 73  Resp: (!) 24  Temp: 97.8 F (36.6 C)  SpO2: 100%   Filed Weights   03/14/17 1307  Weight: 202 lb 3.2 oz (91.7 kg)    GENERAL:alert, no distress and comfortable SKIN: skin color, texture, turgor are normal, no rashes or significant lesions EYES: normal, Conjunctiva are pink and non-injected, sclera clear OROPHARYNX:no exudate, no erythema and lips, buccal mucosa, and tongue normal  NECK: supple, thyroid normal size, non-tender, without nodularity LYMPH:  no palpable lymphadenopathy in the cervical, axillary or inguinal LUNGS: clear to auscultation  bilaterally with normal breathing effort HEART: regular rate & rhythm (+) murmur and (+) bilateral symmetric lower extremity edema ABDOMEN:abdomen soft, non-tender and normal bowel sounds Musculoskeletal:no cyanosis of digits and no clubbing  NEURO: alert & oriented x 3 with fluent speech, no focal motor/sensory deficits  LABORATORY DATA:  I have reviewed the data as listed CBC Latest Ref Rng & Units 03/14/2017 03/01/2017 02/26/2017  WBC 3.9 - 10.3 K/uL 5.3 5.6 8.5  Hemoglobin 11.6 - 15.9 g/dL 8.7(L) 8.5(L) 8.2(L)  Hematocrit 34.8 - 46.6 % 28.4(L) 26.3(L) 26.0(L)  Platelets 145 - 400 K/uL 467(H) 219 266     CMP Latest Ref Rng & Units 03/14/2017 03/01/2017 02/26/2017  Glucose 70 - 140 mg/dL 356(H) 223(H) 115(H)  BUN 7 - 26 mg/dL 32(H) 26 38(H)  Creatinine 0.60 - 1.10 mg/dL 1.08 0.99 1.17(H)  Sodium 136 - 145 mmol/L 136 136 136  Potassium 3.3 - 4.7 mmol/L 3.9 3.4 2.9(L)  Chloride 98 - 109 mmol/L 99 97(L) 98(L)  CO2 22 - 29 mmol/L 26 29 28   Calcium 8.4 - 10.4 mg/dL 8.9 8.9 8.2(L)  Total Protein 6.4 - 8.3 g/dL 6.8 7.0 -  Total Bilirubin 0.2 - 1.2 mg/dL 0.3 0.5 -  Alkaline Phos 40 - 150 U/L 340(H) 344(H) -  AST 5 - 34 U/L 63(H) 29 -  ALT 0 - 55 U/L 40 23 -    RADIOGRAPHIC STUDIES: I have personally reviewed the radiological images as listed and agreed with the findings in the report. No results found.   ASSESSMENT & PLAN: 72 y.o.female with PMH of IDDM with neuropathy, HFpEF, HTN, OSA on intermittent home oxygen, and recently diagnosed pancreatic mass s/p biliary stent 04/28/2016 who was recently treated for acute cholangitis, status post cutaneous biliary drainage. She had biliary drainage tube exchange with peri-stent placement by IR Dr. Laurence Ferrari today  1. Pancreatic adenocarcinoma, borderline resectable, cT2N0M0, stage IB 2. Diarrhea, possibly related to pancreatic insufficiency 3. IDDM, morbid obesity  4. Chronic diastolic heart failure 5. Malnutrition and deconditioning 6. Goal of  care discussion 7. Leg pain, restless leg 8. Anemia   Ms. Melissa Myers appears stable today. She has completed 1 cycle of single agent gemcitabine on days 1 and 8 every 21 days. She tolerated first cycle well overall. DM remains uncontrolled, ranging 250-320 at home and periodically 500. She saw PCP recently, I suggest she contact provider for improved management. She notes she did not get insulin injection today and labs were non-fasting this morning, had eaten toast and juice. She has not started lomotil for diarrhea. I suggest she determine whether she has it at home or call pharmacy for Rx, if she needs new Rx she will call and let me know. Continue creon 2 with snacks and 4 with meals.   VS likely increased due to leg pain and restless leg discomfort today. Labs reviewed, has mild but stable anemia, Hgb 8.7; she does not require transfusion at this time. Platelet increased to 467, could be reactive. AST elevated, possibly related to chemotherapy; Alk phos stable. Labs adequate for treatment; will monitor closely. She wishes to come back tomorrow for infusion due to feeling tired, restless leg, and not having had insulin. Return apt given for 1/23 at 8 AM. She'll return in 1 week for day 8; f/u in 3 weeks.  PLAN -Continue creon, begin lomotil for diarrhea -Labs reviewed, adequate for treatment, proceed with next cycle single agent gemcitabine 1/23 -Return in 1 week for day 8, f/u in 3 weeks -Contact PCP for DM management  All questions were answered. The patient knows to call the clinic with any problems, questions or concerns. No barriers to learning was detected.    Alla Feeling, NP 03/14/17

## 2017-03-14 NOTE — Telephone Encounter (Signed)
Gave patient avs report and appointments for January and February  °

## 2017-03-14 NOTE — Patient Instructions (Signed)
Implanted Port Home Guide An implanted port is a type of central line that is placed under the skin. Central lines are used to provide IV access when treatment or nutrition needs to be given through a person's veins. Implanted ports are used for long-term IV access. An implanted port may be placed because:  You need IV medicine that would be irritating to the small veins in your hands or arms.  You need long-term IV medicines, such as antibiotics.  You need IV nutrition for a long period.  You need frequent blood draws for lab tests.  You need dialysis.  Implanted ports are usually placed in the chest area, but they can also be placed in the upper arm, the abdomen, or the leg. An implanted port has two main parts:  Reservoir. The reservoir is round and will appear as a small, raised area under your skin. The reservoir is the part where a needle is inserted to give medicines or draw blood.  Catheter. The catheter is a thin, flexible tube that extends from the reservoir. The catheter is placed into a large vein. Medicine that is inserted into the reservoir goes into the catheter and then into the vein.  How will I care for my incision site? Do not get the incision site wet. Bathe or shower as directed by your health care provider. How is my port accessed? Special steps must be taken to access the port:  Before the port is accessed, a numbing cream can be placed on the skin. This helps numb the skin over the port site.  Your health care provider uses a sterile technique to access the port. ? Your health care provider must put on a mask and sterile gloves. ? The skin over your port is cleaned carefully with an antiseptic and allowed to dry. ? The port is gently pinched between sterile gloves, and a needle is inserted into the port.  Only "non-coring" port needles should be used to access the port. Once the port is accessed, a blood return should be checked. This helps ensure that the port  is in the vein and is not clogged.  If your port needs to remain accessed for a constant infusion, a clear (transparent) bandage will be placed over the needle site. The bandage and needle will need to be changed every week, or as directed by your health care provider.  Keep the bandage covering the needle clean and dry. Do not get it wet. Follow your health care provider's instructions on how to take a shower or bath while the port is accessed.  If your port does not need to stay accessed, no bandage is needed over the port.  What is flushing? Flushing helps keep the port from getting clogged. Follow your health care provider's instructions on how and when to flush the port. Ports are usually flushed with saline solution or a medicine called heparin. The need for flushing will depend on how the port is used.  If the port is used for intermittent medicines or blood draws, the port will need to be flushed: ? After medicines have been given. ? After blood has been drawn. ? As part of routine maintenance.  If a constant infusion is running, the port may not need to be flushed.  How long will my port stay implanted? The port can stay in for as long as your health care provider thinks it is needed. When it is time for the port to come out, surgery will be   done to remove it. The procedure is similar to the one performed when the port was put in. When should I seek immediate medical care? When you have an implanted port, you should seek immediate medical care if:  You notice a bad smell coming from the incision site.  You have swelling, redness, or drainage at the incision site.  You have more swelling or pain at the port site or the surrounding area.  You have a fever that is not controlled with medicine.  This information is not intended to replace advice given to you by your health care provider. Make sure you discuss any questions you have with your health care provider. Document  Released: 02/07/2005 Document Revised: 07/16/2015 Document Reviewed: 10/15/2012 Elsevier Interactive Patient Education  2017 [employer]  

## 2017-03-15 ENCOUNTER — Ambulatory Visit: Payer: PPO

## 2017-03-15 ENCOUNTER — Ambulatory Visit: Payer: PPO | Admitting: Nurse Practitioner

## 2017-03-15 ENCOUNTER — Inpatient Hospital Stay: Payer: PPO

## 2017-03-15 ENCOUNTER — Other Ambulatory Visit: Payer: PPO

## 2017-03-15 VITALS — BP 129/64 | HR 68 | Temp 97.7°F | Resp 20 | Wt 202.0 lb

## 2017-03-15 DIAGNOSIS — C250 Malignant neoplasm of head of pancreas: Secondary | ICD-10-CM

## 2017-03-15 DIAGNOSIS — Z5111 Encounter for antineoplastic chemotherapy: Secondary | ICD-10-CM | POA: Diagnosis not present

## 2017-03-15 MED ORDER — SODIUM CHLORIDE 0.9% FLUSH
10.0000 mL | INTRAVENOUS | Status: DC | PRN
  Administered 2017-03-15: 10 mL
  Filled 2017-03-15: qty 10

## 2017-03-15 MED ORDER — ONDANSETRON HCL 4 MG/2ML IJ SOLN
8.0000 mg | Freq: Once | INTRAMUSCULAR | Status: AC
  Administered 2017-03-15: 8 mg via INTRAVENOUS

## 2017-03-15 MED ORDER — ONDANSETRON HCL 4 MG/2ML IJ SOLN
INTRAMUSCULAR | Status: AC
  Filled 2017-03-15: qty 4

## 2017-03-15 MED ORDER — SODIUM CHLORIDE 0.9 % IV SOLN
Freq: Once | INTRAVENOUS | Status: AC
  Administered 2017-03-15: 10:00:00 via INTRAVENOUS

## 2017-03-15 MED ORDER — HEPARIN SOD (PORK) LOCK FLUSH 100 UNIT/ML IV SOLN
500.0000 [IU] | Freq: Once | INTRAVENOUS | Status: AC | PRN
  Administered 2017-03-15: 500 [IU]
  Filled 2017-03-15: qty 5

## 2017-03-15 MED ORDER — PROCHLORPERAZINE MALEATE 10 MG PO TABS
ORAL_TABLET | ORAL | Status: AC
  Filled 2017-03-15: qty 1

## 2017-03-15 MED ORDER — SODIUM CHLORIDE 0.9 % IV SOLN: Freq: Once | INTRAVENOUS | Status: DC

## 2017-03-15 MED ORDER — ONDANSETRON HCL 4 MG/2ML IJ SOLN: 8.0000 mg | Freq: Once | INTRAMUSCULAR | Status: DC

## 2017-03-15 MED ORDER — SODIUM CHLORIDE 0.9 % IV SOLN
2000.0000 mg | Freq: Once | INTRAVENOUS | Status: AC
  Administered 2017-03-15: 2000 mg via INTRAVENOUS
  Filled 2017-03-15: qty 52.6

## 2017-03-15 MED ORDER — PROCHLORPERAZINE MALEATE 10 MG PO TABS
10.0000 mg | ORAL_TABLET | Freq: Once | ORAL | Status: AC
  Administered 2017-03-15: 10 mg via ORAL

## 2017-03-15 NOTE — Addendum Note (Signed)
Addended by: Margaret Pyle on: 03/15/2017 09:49 AM   Modules accepted: Orders

## 2017-03-15 NOTE — Patient Instructions (Signed)
[facility] °Discharge Instructions for Patients Receiving Chemotherapy ° °Today you received the following chemotherapy agents Gemzar ° °To help prevent nausea and vomiting after your treatment, we encourage you to take your nausea medication as directed. °  °If you develop nausea and vomiting that is not controlled by your nausea medication, call the clinic.  ° °BELOW ARE SYMPTOMS THAT SHOULD BE REPORTED IMMEDIATELY: °· *FEVER GREATER THAN 100.5 F °· *CHILLS WITH OR WITHOUT FEVER °· NAUSEA AND VOMITING THAT IS NOT CONTROLLED WITH YOUR NAUSEA MEDICATION °· *UNUSUAL SHORTNESS OF BREATH °· *UNUSUAL BRUISING OR BLEEDING °· TENDERNESS IN MOUTH AND THROAT WITH OR WITHOUT PRESENCE OF ULCERS °· *URINARY PROBLEMS °· *BOWEL PROBLEMS °· UNUSUAL RASH °Items with * indicate a potential emergency and should be followed up as soon as possible. ° °Feel free to call the clinic should you have any questions or concerns. The clinic phone number is [phone]. ° °Please show the CHEMO ALERT CARD at check-in to the Emergency Department and triage nurse. ° ° °

## 2017-03-15 NOTE — Addendum Note (Signed)
Addended by: Truitt Merle on: 03/15/2017 09:39 AM   Modules accepted: Orders

## 2017-03-21 ENCOUNTER — Other Ambulatory Visit: Payer: Self-pay | Admitting: Internal Medicine

## 2017-03-21 ENCOUNTER — Telehealth: Payer: Self-pay | Admitting: *Deleted

## 2017-03-21 NOTE — Telephone Encounter (Signed)
Received call from Diane/Caregiver reporting that pt had chemo last week-wed & it has just wiped her out.  She states that this is just unusual for her.  She says that she is half awake & half asleep & is a little confused.  She states they are also working on constipation issue.  She reports that she is easily aroused & knows person, place & time.  She is concerned about getting additional treatment tomorrow b/c of this & also b/c of the weather.  Discussed with Dr Burr Medico & OK to r/s treatment as long as no fever & eating & drinking well.  Checked with Diane & pt is eating & drinking OK & no fever.  Encouraged to call or go to ED if these problems occur.  Informed otherwise we will cancel tomorrow & r/s for fri or next week & see provider prior to chemo.  She states that pt's husband thinks we need to wait a week.  Message to scheduler to r/s treatment to next week & see Cira Rue prior to.

## 2017-03-22 ENCOUNTER — Telehealth: Payer: Self-pay | Admitting: Hematology and Oncology

## 2017-03-22 ENCOUNTER — Ambulatory Visit: Payer: PPO

## 2017-03-22 ENCOUNTER — Other Ambulatory Visit: Payer: PPO

## 2017-03-22 NOTE — Telephone Encounter (Signed)
Spoke with patient regarding appointment per sched message 1/29

## 2017-03-26 NOTE — Progress Notes (Signed)
[place]  [phone] [phone]  Clinic Follow up Note   Patient Care Team: [name], MD as PCP - General (Internal Medicine) Rigoberto Noel, MD as Consulting Physician (Pulmonary Disease) Larey Dresser, MD as Consulting Physician (Cardiology) Megan Salon, MD as Consulting Physician (Gynecology) Kristeen Miss, MD as Consulting Physician (Neurosurgery) Earlie Server, MD (Orthopedic Surgery) Almedia Balls, MD (Orthopedic Surgery) Lafayette Dragon, MD (Inactive) (Gastroenterology) Renato Shin, MD (Endocrinology) Darleen Crocker, MD (Ophthalmology) 03/28/2017  CHIEF COMPLAINT: Follow up pancreatic cancer  SUMMARY OF ONCOLOGIC HISTORY: Oncology History   Cancer Staging Malignant neoplasm of head of pancreas [facility]) Staging form: Exocrine Pancreas, AJCC 8th Edition - Clinical stage from 04/28/2016: Stage IB (cT2, cN0, cM0) - Signed by Truitt Merle, MD on 02/01/2017       Malignant neoplasm of head of pancreas ([place])   02/2016 Initial Diagnosis    Malignant neoplasm of head of pancreas ([name])      03/15/2016 - 03/21/2016 Hospital Admission    Presents with nausea, vomiting, and jaundice   Patient was found hyperbilirubinemic with abnormal abdominal CT suggesting obstructive physiology. Referred for admission and evaluation.      03/15/2016 Imaging    CT Abdomen Pelvis w Contrast 1. Cholecystectomy with borderline biliary duct dilatation. 2. Pancreatic atrophy with moderate pancreatic duct dilatation, followed to the level of the duodenum. 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 Considerations include otherwise occult stricture, periampullary lesion, or main duct intraductal papillary mucinous neoplasm. Given the combination of borderline biliary duct dilatation, moderate pancreatic duct dilatation, and the clinical history of jaundice, consider further evaluation with ERCP. If the patient is not a good ERCP candidate, MRCP with  and without contrast would be another option. 3.  No acute process in the abdomen or pelvis. 4.  Tiny hiatal hernia. 5.  Aortic atherosclerosis.      03/16/2016 Imaging    MRI Abdomen 1. Hypoenhancing lesion in the uncinate process of the pancreas, better demonstrated by MR than on the recent CT scan. The dilated main pancreatic duct and dilated common bile duct abruptly terminate at the cranial margin of this lesion. MR imaging features are concerning for pancreatic adenocarcinoma. ERCP with endoscopic ultrasound would likely prove helpful to further evaluate. 2. No definite metastatic disease in the liver. No peripancreatic lymphadenopathy is identified. Of note, this exam is limited by marked motion degradation and small liver lesions could be obscured. CT scan earlier today demonstrated no peripancreatic lymphadenopathy.      03/18/2016 Procedure    ERCP 1. Malignant-appearing distal bile duct stricturestatus post ERCP with sphincterotomy and plastic biliary stent placement 2. Guidewire finding false lumen with subsequentinjection as described 3. Status post cholecystectomy  Recommendation: 1. Clear liquid diet 2. Unasyn 1.5 g every 6 hours 3. Standard post ERCP observation. Indomethacin suppositories given 4. Trend LFTs 5. Will need endoscopic ultrasound with biopsy with Dr. Ardis Hughs outpatient.      04/28/2016 Pathology Results    Endoscopic [place] and Biopsy FINE NEEDLE ASPIRATION, ENDOSCOPIC, PANCREAS UNCINATE (SPECIMEN 1 OF 1 COLLECTED 04/28/16): MALIGNANT CELLS CONSISTENT WITH ADENOCARCINOMA.      05/04/2016 -  Hospital Admission    Presents with fever, abdominal pain, malaise, confusion. She was treated for acute cholangitis.       05/05/2016 Imaging    CT Abdomen Pelvis w Contrast Interval placement of a bile duct stent with some decompression of the bile ducts. Mild residual biliary dilatation. Pancreatic ductal dilatation and atrophy. Known pancreatic mass lesion  is not well depicted at CT. No evidence of bowel obstruction or inflammation.      05/05/2016 Imaging    CT Head w/o Contrast 05/05/16 IMPRESSION: No acute intracranial pathology.      05/06/2016 Procedure    ERCP and Bilary stent placement  Imaging demonstrating removal of endoscopic common bile duct stent. The stent was not able to be replaced.       05/07/2016 Imaging    CT Chest wo Contrast  1. New small right and trace left pleural effusions. Mild cardiomegaly. 2. Borderline enlarged right hilar lymph node at 1 cm in short axis. 3. No definite findings of metastatic disease to the chest. 4. Centrilobular emphysema. 5. High density in the visualized part of the dorsal pancreatic duct, probably contrast medium. 6. Subacute healing fracture of the right seventh rib laterally. 7. Atherosclerosis, including the left anterior descending coronary artery.      05/07/2016 Procedure    On 05/07/16, percutaneous transhepatic cholangiogram and placement of a 82F internal/external biliary drain was performed by IR with aspirated purulent bile sent for culture. Bile culture grew pantoea species, enterococcus faecalis, viridans streptococcus.      07/11/2016 - 07/21/2016 Radiation Therapy    Diagnosis:   cT2N0M0  adenocarcinoma of the pancreas  Indication for treatment:  Curative  Radiation treatment dates:   07/11/16 - 07/21/16  Site/dose:   Pancreas: 33 Gy in 5 fractions  Beams/energy:   SBRT/SRT-VMAT // 10X-FFF Photon  Narrative: The patient tolerated radiation treatment relatively well. The patient complained of diarrhea during treatment. She was instructed to purchase Imodium.      08/2016 - 10/2016 Chemotherapy    She had chemotherapy consistent of gemcitabine and abraxine for 2-3 months under the care of Dr. Berneice Gandy at [facility] with her last treatment being about 10/2016.        02/20/2017 Imaging    CT CAP 02/20/17  IMPRESSION: 1. Response to therapy of the primary  pancreatic head/uncinate process lesion. Please note that delineation of the tumor is difficult secondary to anatomic distortion from therapy. 2. No new or progressive disease identified. 3. Removal of biliary stent, with pneumobilia and borderline similar intrahepatic biliary duct dilatation. 4.  No acute process or evidence of metastatic disease in the chest. 5.  Possible constipation. 6. Coronary artery atherosclerosis. Aortic Atherosclerosis (ICD10-I70.0). 7.  Emphysema (ICD10-J43.9). 8. Pulmonary artery enlargement suggests pulmonary arterial hypertension.      02/22/2017 -  Chemotherapy    Single agent Gemcitabine every 2-3 weeks on and 1 week off, starting 02/22/17      CURRENT THERAPY: single agent gemcitabine 2 weeks on and 1 week off starting1/2/19  INTERVAL HISTORY: Melissa Myers returns for f/u as scheduled prior to next cycle single agent gemcitabine. Last dosed 1/23, she required treatment delay last week due to fatigue and not enough energy to come in. She has had more than 10 episodes of diarrhea in last 24 hours, which has orange and oily appearance. Takes lomotil once occasionally every 6 hours; no imodium. Diarrhea has been intermittent throughout treatment but caregiver reports this is increased from baseline. No nausea or vomiting, continues to eat and drink with good appetite. Right lower extremity has been larger than left for several months but R leg swelling is increased lately from baseline; mildly red and painful below the knee. Home nurse aid who handles medication may have started torsemide last night but the caregiver is unsure about this.   REVIEW OF SYSTEMS:   Constitutional: Denies  fevers, chills or abnormal weight loss (+) fatigue Eyes: Denies blurriness of vision Ears, nose, mouth, throat, and face: Denies mucositis or sore throat Respiratory: Denies cough, dyspnea or wheezes Cardiovascular: Denies palpitation, chest discomfort (+) lower extremity  swelling with painful R lower extremity; R larger than L Gastrointestinal:  Denies nausea, vomiting, constipation, heartburn (+) diarrhea 10+ episodes in 24 hours Skin: Denies abnormal skin rashes (+) redness and discoloration to lower extremities  Lymphatics: Denies new lymphadenopathy or easy bruising Neurological:Denies new numbness, tingling or new weaknesses (+) intermittent numbness/tingling to fingertips, baseline (+) restless leg  Behavioral/Psych: Mood is stable, no new changes  All other systems were reviewed with the patient and are negative.  MEDICAL HISTORY:  Past Medical History:  Diagnosis Date  . Anemia   . Cancer [facility])    Pancreatic  . Cellulitis    LOWER EXTREMITIES  . Chronic diarrhea    a/w nausea - felt related to IBS  . Deaf    left side only  . Diastolic CHF ([place])   . Disc degeneration, lumbar   . GERD (gastroesophageal reflux disease)   . Hyperlipidemia    hx rhabdo on statins  . Hypertension   . Neuropathy    feet, toes and fingers  . On home oxygen therapy    uses oxygen 2 liters min per [place] at night and prn during day  . OSA (obstructive sleep apnea)    05/2009 sleep study - refuses CPAP  . Osteoarthritis   . RLS (restless legs syndrome)   . Shortness of breath    chronic  . Stasis dermatitis   . Type II or unspecified type diabetes mellitus without mention of complication, not stated as uncontrolled    insulin dep    SURGICAL HISTORY: Past Surgical History:  Procedure Laterality Date  . BILIARY STENT PLACEMENT N/A 05/06/2016   Procedure: BILIARY STENT PLACEMENT;  Surgeon: Gatha Mayer, MD;  Location: [place];  Service: Endoscopy;  Laterality: N/A;  . CHOLECYSTECTOMY  1997  . COLONOSCOPY N/A 12/03/2012   Procedure: COLONOSCOPY;  Surgeon: Lafayette Dragon, MD;  Location: [facility];  Service: Endoscopy;  Laterality: N/A;  . ENDOSCOPIC RETROGRADE CHOLANGIOPANCREATOGRAPHY (ERCP) WITH PROPOFOL N/A 05/06/2016   Procedure: ENDOSCOPIC RETROGRADE  CHOLANGIOPANCREATOGRAPHY (ERCP) WITH PROPOFOL;  Surgeon: Gatha Mayer, MD;  Location: [place];  Service: Endoscopy;  Laterality: N/A;  . ERCP N/A 03/18/2016   Procedure: ENDOSCOPIC RETROGRADE CHOLANGIOPANCREATOGRAPHY (ERCP);  Surgeon: Irene Shipper, MD;  Location: [facility];  Service: Endoscopy;  Laterality: N/A;  . EUS N/A 04/28/2016   Procedure: UPPER ENDOSCOPIC ULTRASOUND (EUS) LINEAR;  Surgeon: [name], MD;  Location: [facility];  Service: Endoscopy;  Laterality: N/A;  . IR CHOLANGIOGRAM EXISTING TUBE  06/09/2016  . IR GENERIC HISTORICAL  05/07/2016   IR BILIARY DRAIN PLACEMENT WITH CHOLANGIOGRAM 05/07/2016 Jacqulynn Cadet, MD [facility]-INTER[facility] RAD  . IR GENERIC HISTORICAL  05/19/2016   IR BILIARY STENT(S) EXISTING ACCESS INC DILATION CATH EXCHANGE 05/19/2016 Jacqulynn Cadet, MD [facility]-INTERV RAD  . TONSILLECTOMY  1970  . TUBAL LIGATION  1980  . UMBILICAL HERNIA REPAIR  1995  . uterine polyp removal  2008    I have reviewed the social history and family history with the patient and they are unchanged from previous note.  ALLERGIES:  is allergic to morphine; statins; sulfa antibiotics; cymbalta [duloxetine hcl]; sulfasalazine; levemir [insulin detemir]; and zinc.  MEDICATIONS:  Current Outpatient Medications  Medication Sig Dispense Refill  . aspirin EC 81 MG tablet  Take 1 tablet (81 mg total) by mouth daily.    . carvedilol (COREG) 25 MG tablet TAKE ONE TABLET TWICE DAILY WITH FOOD 60 tablet 5  . Cholecalciferol (VITAMIN D3) 2000 units capsule Take 1 capsule (2,000 Units total) by mouth daily. 90 capsule 3  . Continuous Blood Gluc Sensor (FREESTYLE LIBRE SENSOR SYSTEM) MISC Use to check sugars 1 each 0  . diphenoxylate-atropine (LOMOTIL) 2.5-0.025 MG tablet Take 1-2 tablets by mouth 4 (four) times daily as needed for diarrhea or loose stools. 120 tablet 2  . gabapentin (NEURONTIN) 300 MG capsule TAKE TWO CAPSULES FOUR TIMES DAILY 240 capsule 1  . hydrALAZINE (APRESOLINE) 25 MG  tablet TAKE 1 TABLET (25 MG TOTAL) BY MOUTH 3 (THREE) TIMES DAILY. 90 tablet 3  . insulin aspart (NOVOLOG) 100 UNIT/ML injection Inject 0-20 Units into the skin 3 (three) times daily with meals. . CBG 70 - 120: 0 units CBG 121 - 150: 3 units CBG 151 - 200: 4 units CBG 201 - 250: 7 units CBG 251 - 300: 11 units CBG 301 - 350: 15 units CBG 351 - 400: 20 units    . insulin glargine (LANTUS) 100 UNIT/ML injection Inject 0.3 mLs (30 Units total) into the skin daily.    [name] lactulose (CHRONULAC) 10 GM/15ML solution Take 10 g by mouth daily as needed for mild constipation.    . lidocaine (LIDODERM) 5 % Place 1 patch daily onto the skin. Remove & Discard patch within 12 hours or as directed by MD 30 patch 0  . lidocaine-prilocaine (EMLA) cream Apply to port site prior to being accessed    . lipase/protease/amylase (CREON) 36000 UNITS CPEP capsule 2 po prior to snacks; 4 po prior to meals    . meclizine (ANTIVERT) 25 MG tablet Take 25 mg 2 (two) times daily as needed by mouth for dizziness.     . metFORMIN (GLUCOPHAGE) 500 MG tablet Take 500 mg by mouth daily with breakfast.    . mirtazapine (REMERON) 30 MG tablet Take 1 tablet (30 mg total) by mouth at bedtime. 30 tablet 2  . mupirocin ointment (BACTROBAN) 2 % Apply 1 application topically daily. 90 g 0  . ondansetron (ZOFRAN) 8 MG tablet Take 1 tablet (8 mg total) by mouth every 8 (eight) hours as needed for nausea or vomiting. 30 tablet 2  . ONETOUCH VERIO test strip USE AS INSTRUCTED UP TO 4 TIMES DAILY 200 each 11  . oxyCODONE (OXY IR/ROXICODONE) 5 MG immediate release tablet Take 1 tablet (5 mg total) by mouth 2 (two) times daily as needed for severe pain. 60 tablet 0  . pregabalin (LYRICA) 75 MG capsule Take 1 capsule (75 mg total) daily by mouth. 30 capsule 3  . rOPINIRole (REQUIP XL) 4 MG 24 hr tablet TAKE ONE TABLET AT BEDTIME 30 tablet 5  . rOPINIRole (REQUIP) 2 MG tablet Take 1 tablet (2 mg total) by mouth 3 (three) times daily. 90 tablet 2    . sertraline (ZOLOFT) 50 MG tablet Take 50 mg by mouth daily.    . silver sulfADIAZINE (SILVADENE) 1 % cream Apply 1 application daily topically. 50 g 0  . spironolactone (ALDACTONE) 25 MG tablet Take 1 tablet (25 mg total) by mouth daily. 90 tablet 3  . traMADol (ULTRAM) 50 MG tablet Take 1 tablet (50 mg total) by mouth every 6 (six) hours as needed. (Patient taking differently: Take 50 mg every 6 (six) hours as needed by mouth for moderate pain. ) 60  tablet 0   No current facility-administered medications for this visit.    Facility-Administered Medications Ordered in Other Visits  Medication Dose Route Frequency Provider Last Rate Last Dose  . 0.9 %  sodium chloride infusion   Intravenous Once Truitt Merle, MD      . gemcitabine [facility]) 1,976 mg in sodium chloride 0.9 % 100 mL chemo infusion  1,000 mg/m2 (Treatment Plan Recorded) Intravenous Once Truitt Merle, MD      . heparin lock flush 100 unit/mL  500 Units Intracatheter Once PRN Truitt Merle, MD      . ondansetron [facility]) injection 8 mg  8 mg Intravenous Once Cira Rue K, NP      . prochlorperazine (COMPAZINE) tablet 10 mg  10 mg Oral Once Truitt Merle, MD      . sodium chloride flush (NS) 0.9 % injection 10 mL  10 mL Intracatheter PRN Truitt Merle, MD   10 mL at 02/22/17 1729  . sodium chloride flush (NS) 0.9 % injection 10 mL  10 mL Intracatheter PRN Truitt Merle, MD        PHYSICAL EXAMINATION: ECOG PERFORMANCE STATUS: 3 - Symptomatic, >50% confined to bed  Vitals:   03/28/17 1334  BP: (!) 144/49  Pulse: 64  Resp: 18  Temp: 98 F (36.7 C)  SpO2: 100%   Filed Weights   03/28/17 1334  Weight: 211 lb 1.6 oz (95.8 kg)    GENERAL:alert, no distress and comfortable SKIN: skin color, texture, turgor are normal, no rashes or significant lesions EYES: normal, Conjunctiva are pink and non-injected, sclera clear OROPHARYNX:no exudate, no erythema and lips, buccal mucosa, and tongue normal  NECK: supple, thyroid normal size, non-tender,  without nodularity LYMPH:  no palpable cervical or supraclavicular lymphadenopathy LUNGS: clear to auscultation bilaterally with normal breathing effort HEART: regular rate & rhythm (+) murmur (+) lower extremity edema, R>L with venous stasis changes to lower legs bilaterally (+) demarcated erythema to right LE below the knee, warm, tender to touch ABDOMEN:abdomen soft, non-tender and normal bowel sounds, limited exam as patient is in wheelchair  Musculoskeletal:no cyanosis of digits and no clubbing  NEURO: alert & oriented x 3 with fluent speech, no focal motor/sensory deficits PAC without erythema   LABORATORY DATA:  I have reviewed the data as listed CBC Latest Ref Rng & Units 03/28/2017 03/14/2017 03/01/2017  WBC 3.9 - 10.3 K/uL 7.1 5.3 5.6  Hemoglobin 11.6 - 15.9 g/dL 8.9(L) 8.7(L) 8.5(L)  Hematocrit 34.8 - 46.6 % 28.2(L) 28.4(L) 26.3(L)  Platelets 145 - 400 K/uL 233 467(H) 219     CMP Latest Ref Rng & Units 03/28/2017 03/14/2017 03/01/2017  Glucose 70 - 140 mg/dL 288(H) 356(H) 223(H)  BUN 7 - 26 mg/dL 23 32(H) 26  Creatinine 0.60 - 1.10 mg/dL 0.94 1.08 0.99  Sodium 136 - 145 mmol/L 138 136 136  Potassium 3.5 - 5.1 mmol/L 4.2 3.9 3.4  Chloride 98 - 109 mmol/L 101 99 97(L)  CO2 22 - 29 mmol/L 25 26 29   Calcium 8.4 - 10.4 mg/dL 9.0 8.9 8.9  Total Protein 6.4 - 8.3 g/dL 6.9 6.8 7.0  Total Bilirubin 0.2 - 1.2 mg/dL 0.2 0.3 0.5  Alkaline Phos 40 - 150 U/L 242(H) 340(H) 344(H)  AST 5 - 34 U/L 31 63(H) 29  ALT 0 - 55 U/L 27 40 23    RADIOGRAPHIC STUDIES: I have personally reviewed the radiological images as listed and agreed with the findings in the report. No results found.   ASSESSMENT &  PLAN: 72 y.o.female with PMH of IDDM with neuropathy, HFpEF, HTN, OSA on intermittent home oxygen, and recently diagnosed pancreatic mass s/p biliary stent 04/28/2016 who was recently treated for acute cholangitis, status post cutaneous biliary drainage. She had biliary drainage tube exchange with  peri-stent placement by IR Dr. Laurence Ferrari   1. Pancreatic adenocarcinoma, borderline resectable, cT2N0M0, stage IB -Ms. Onorato appears stable today; she experienced treatment delay last week due to fatigue and not able to come in for f/u and treatment.  -her fatigue is improved today, however she is experiencing increased diarrhea and leg edema from baseline.  -labs reviewed, CBC and CMet overall adequate to proceed with single agent gemcitabine today; plan for days 1 and 8 of 21-day cycle -Return for lab and f/u with Dr. Burr Medico in 1 week  2. Diarrhea, possibly related to pancreatic insufficiency -she has had intermittent diarrhea throughout chemotherapy and since diagnosis, this seems increased from baseline -She takes lomotil occasionally -Takes Creon, 4 capsules with meals and 2 with snacks, she will continue -I recommend she increase lomotil to 2 tablets q4 hours and alternate with maximum dose imodium per package insert to control her diarrhea -I encouraged her to increase po liquid intake when she has increased diarrhea -Will check stool sample to rule out C.dif infection  3. IDDM, morbid obesity -poorly controlled DM; caregiver reports BG has been >500 x3 this week at home -BG 288 today on Cmet -I encouraged her to monitor BG and f/u with PCP  4. Chronic diastolic heart failure -continue f/u with PCP  5. Leg pain, restless leg -She has chronic edema and stasis changes in lower extremities; she reports R>L edema for few months -Right lower extremity erythema and pain are new; we discussed pancreatic cancer increases risk for DVT -will obtain US doppler to rule out DVT and begin Keflex for cellulitis; could be pseudo-cellulitis from gemcitabine -Reviewed possible side effects of abx with patient -Reviewed case with Dr. Benay Spice who examined the patient  6. Anemia  -Anemia is stable, Hgb 8.9; she is asymptomatic of her anemia   PLAN -Increase lomotil and imodium; continue  creon for diarrhea -Stool sample to rule out C.dif infection -Begin Keflex 500 mg BID x7 days for cellulitis vs pseudo-cellulitis, prescription sent to pharmacy  -Bilateral US doppler of lower extremities to r/o DVT; 03/29/17 at 10 AM at [place]  -labs reviewed, OK to proceed with single agent gemcitabine today; continue day 1 and day 8 q21 days -Return in 1 week for lab, f/u with Dr. Burr Medico, and next chemo   Orders Placed This Encounter  Procedures  . Fecal, C-Dff PCR    Standing Status:   Future    Standing Expiration Date:   03/28/2018    Order Specific Question:   Is your patient experiencing loose or watery stools (3 or more in 24 hours)?    Answer:   Yes    Order Specific Question:   Has the patient received laxatives in the last 24 hours?    Answer:   No    Order Specific Question:   Has a negative Cdiff test resulted in the last 7 days?    Answer:   No   All questions were answered. The patient knows to call the clinic with any problems, questions or concerns. No barriers to learning was detected. I spent 25 minutes counseling the patient face to face. The total time spent in the appointment was 30 minutes and more than 50% was on counseling  and review of test results.     Alla Feeling, NP 03/28/17   This was a shared visit with Cira Rue.  Ms. Maiden was interviewed and examined.  She is stable to continue gemcitabine chemotherapy for treatment of localized pancreas cancer.  The erythema and edema at the right lower leg is likely related to chronic stasis change versus cellulitis.  This could also be related to pseudo-cellulitis from gemcitabine.  She will complete a course of antibiotics and we will refer her for a Doppler of the right leg.  She reports chronic diarrhea, likely secondary to pancreas insufficiency.  She will continue Creon, Imodium, and Lomotil.  We will check a stool sample for the C. difficile toxin.  Julieanne Manson, MD

## 2017-03-27 ENCOUNTER — Telehealth: Payer: Self-pay | Admitting: Hematology

## 2017-03-27 NOTE — Telephone Encounter (Signed)
Scheduled appts per 2/1 sch msg - spoke with patients son regarding appts

## 2017-03-28 ENCOUNTER — Inpatient Hospital Stay: Payer: PPO | Attending: Hematology

## 2017-03-28 ENCOUNTER — Inpatient Hospital Stay (HOSPITAL_BASED_OUTPATIENT_CLINIC_OR_DEPARTMENT_OTHER): Payer: PPO | Admitting: Nurse Practitioner

## 2017-03-28 ENCOUNTER — Inpatient Hospital Stay: Payer: PPO

## 2017-03-28 ENCOUNTER — Encounter: Payer: Self-pay | Admitting: Nurse Practitioner

## 2017-03-28 VITALS — BP 144/49 | HR 64 | Temp 98.0°F | Resp 18 | Ht 65.0 in | Wt 211.1 lb

## 2017-03-28 DIAGNOSIS — I700 Atherosclerosis of aorta: Secondary | ICD-10-CM | POA: Insufficient documentation

## 2017-03-28 DIAGNOSIS — Z7982 Long term (current) use of aspirin: Secondary | ICD-10-CM | POA: Insufficient documentation

## 2017-03-28 DIAGNOSIS — I5030 Unspecified diastolic (congestive) heart failure: Secondary | ICD-10-CM | POA: Diagnosis not present

## 2017-03-28 DIAGNOSIS — E46 Unspecified protein-calorie malnutrition: Secondary | ICD-10-CM | POA: Insufficient documentation

## 2017-03-28 DIAGNOSIS — E785 Hyperlipidemia, unspecified: Secondary | ICD-10-CM

## 2017-03-28 DIAGNOSIS — G629 Polyneuropathy, unspecified: Secondary | ICD-10-CM | POA: Insufficient documentation

## 2017-03-28 DIAGNOSIS — R5383 Other fatigue: Secondary | ICD-10-CM | POA: Diagnosis not present

## 2017-03-28 DIAGNOSIS — L03115 Cellulitis of right lower limb: Secondary | ICD-10-CM | POA: Insufficient documentation

## 2017-03-28 DIAGNOSIS — R197 Diarrhea, unspecified: Secondary | ICD-10-CM

## 2017-03-28 DIAGNOSIS — Z794 Long term (current) use of insulin: Secondary | ICD-10-CM | POA: Diagnosis not present

## 2017-03-28 DIAGNOSIS — Z9981 Dependence on supplemental oxygen: Secondary | ICD-10-CM

## 2017-03-28 DIAGNOSIS — K219 Gastro-esophageal reflux disease without esophagitis: Secondary | ICD-10-CM

## 2017-03-28 DIAGNOSIS — Z9049 Acquired absence of other specified parts of digestive tract: Secondary | ICD-10-CM

## 2017-03-28 DIAGNOSIS — Z8619 Personal history of other infectious and parasitic diseases: Secondary | ICD-10-CM

## 2017-03-28 DIAGNOSIS — E669 Obesity, unspecified: Secondary | ICD-10-CM | POA: Diagnosis not present

## 2017-03-28 DIAGNOSIS — I10 Essential (primary) hypertension: Secondary | ICD-10-CM

## 2017-03-28 DIAGNOSIS — M25561 Pain in right knee: Secondary | ICD-10-CM

## 2017-03-28 DIAGNOSIS — Z452 Encounter for adjustment and management of vascular access device: Secondary | ICD-10-CM | POA: Diagnosis not present

## 2017-03-28 DIAGNOSIS — Z923 Personal history of irradiation: Secondary | ICD-10-CM | POA: Insufficient documentation

## 2017-03-28 DIAGNOSIS — D649 Anemia, unspecified: Secondary | ICD-10-CM | POA: Insufficient documentation

## 2017-03-28 DIAGNOSIS — C250 Malignant neoplasm of head of pancreas: Secondary | ICD-10-CM

## 2017-03-28 DIAGNOSIS — I517 Cardiomegaly: Secondary | ICD-10-CM | POA: Insufficient documentation

## 2017-03-28 DIAGNOSIS — Z5111 Encounter for antineoplastic chemotherapy: Secondary | ICD-10-CM | POA: Insufficient documentation

## 2017-03-28 DIAGNOSIS — I2510 Atherosclerotic heart disease of native coronary artery without angina pectoris: Secondary | ICD-10-CM | POA: Diagnosis not present

## 2017-03-28 DIAGNOSIS — M7989 Other specified soft tissue disorders: Secondary | ICD-10-CM | POA: Insufficient documentation

## 2017-03-28 DIAGNOSIS — M5136 Other intervertebral disc degeneration, lumbar region: Secondary | ICD-10-CM

## 2017-03-28 DIAGNOSIS — K449 Diaphragmatic hernia without obstruction or gangrene: Secondary | ICD-10-CM

## 2017-03-28 DIAGNOSIS — R600 Localized edema: Secondary | ICD-10-CM

## 2017-03-28 DIAGNOSIS — E1165 Type 2 diabetes mellitus with hyperglycemia: Secondary | ICD-10-CM | POA: Diagnosis not present

## 2017-03-28 DIAGNOSIS — J439 Emphysema, unspecified: Secondary | ICD-10-CM | POA: Insufficient documentation

## 2017-03-28 DIAGNOSIS — Z79899 Other long term (current) drug therapy: Secondary | ICD-10-CM | POA: Insufficient documentation

## 2017-03-28 DIAGNOSIS — Z8781 Personal history of (healed) traumatic fracture: Secondary | ICD-10-CM

## 2017-03-28 DIAGNOSIS — L539 Erythematous condition, unspecified: Secondary | ICD-10-CM

## 2017-03-28 DIAGNOSIS — Z95828 Presence of other vascular implants and grafts: Secondary | ICD-10-CM

## 2017-03-28 LAB — COMPREHENSIVE METABOLIC PANEL
ALT: 27 U/L (ref 0–55)
AST: 31 U/L (ref 5–34)
Albumin: 2.8 g/dL — ABNORMAL LOW (ref 3.5–5.0)
Alkaline Phosphatase: 242 U/L — ABNORMAL HIGH (ref 40–150)
Anion gap: 12 — ABNORMAL HIGH (ref 3–11)
BUN: 23 mg/dL (ref 7–26)
CHLORIDE: 101 mmol/L (ref 98–109)
CO2: 25 mmol/L (ref 22–29)
CREATININE: 0.94 mg/dL (ref 0.60–1.10)
Calcium: 9 mg/dL (ref 8.4–10.4)
GFR calc Af Amer: >60 mL/min (ref >60)
GFR, EST NON AFRICAN AMERICAN: 60 mL/min — AB (ref >60)
Glucose, Bld: 288 mg/dL — ABNORMAL HIGH (ref 70–140)
Potassium: 4.2 mmol/L (ref 3.5–5.1)
SODIUM: 138 mmol/L (ref 136–145)
Total Bilirubin: 0.2 mg/dL (ref 0.2–1.2)
Total Protein: 6.9 g/dL (ref 6.4–8.3)

## 2017-03-28 LAB — CBC WITH DIFFERENTIAL/PLATELET
Basophils Absolute: 0.1 10*3/uL (ref 0.0–0.1)
Basophils Relative: 1 %
EOS ABS: 0.1 10*3/uL (ref 0.0–0.5)
EOS PCT: 2 %
HCT: 28.2 % — ABNORMAL LOW (ref 34.8–46.6)
Hemoglobin: 8.9 g/dL — ABNORMAL LOW (ref 11.6–15.9)
LYMPHS ABS: 1.7 10*3/uL (ref 0.9–3.3)
Lymphocytes Relative: 24 %
MCH: 25.3 pg (ref 25.1–34.0)
MCHC: 31.7 g/dL (ref 31.5–36.0)
MCV: 79.7 fL (ref 79.5–101.0)
MONOS PCT: 12 %
Monocytes Absolute: 0.9 10*3/uL (ref 0.1–0.9)
Neutro Abs: 4.4 10*3/uL (ref 1.5–6.5)
Neutrophils Relative %: 61 %
PLATELETS: 233 10*3/uL (ref 145–400)
RBC: 3.54 MIL/uL — ABNORMAL LOW (ref 3.70–5.45)
RDW: 23.2 % — ABNORMAL HIGH (ref 11.2–14.5)
WBC: 7.1 10*3/uL (ref 3.9–10.3)

## 2017-03-28 MED ORDER — PROCHLORPERAZINE MALEATE 10 MG PO TABS
10.0000 mg | ORAL_TABLET | Freq: Once | ORAL | Status: AC
  Administered 2017-03-28: 10 mg via ORAL

## 2017-03-28 MED ORDER — SODIUM CHLORIDE 0.9% FLUSH
10.0000 mL | INTRAVENOUS | Status: DC | PRN
  Administered 2017-03-28: 10 mL
  Filled 2017-03-28: qty 10

## 2017-03-28 MED ORDER — HEPARIN SOD (PORK) LOCK FLUSH 100 UNIT/ML IV SOLN
500.0000 [IU] | Freq: Once | INTRAVENOUS | Status: AC | PRN
  Administered 2017-03-28: 500 [IU]
  Filled 2017-03-28: qty 5

## 2017-03-28 MED ORDER — SODIUM CHLORIDE 0.9% FLUSH
10.0000 mL | INTRAVENOUS | Status: DC | PRN
  Administered 2017-03-28: 10 mL via INTRAVENOUS
  Filled 2017-03-28: qty 10

## 2017-03-28 MED ORDER — CEPHALEXIN 500 MG PO CAPS: 500.0000 mg | ORAL_CAPSULE | Freq: Two times a day (BID) | ORAL | 0 refills | Status: DC

## 2017-03-28 MED ORDER — SODIUM CHLORIDE 0.9 % IV SOLN
Freq: Once | INTRAVENOUS | Status: AC
  Administered 2017-03-28: 15:00:00 via INTRAVENOUS

## 2017-03-28 MED ORDER — SODIUM CHLORIDE 0.9 % IV SOLN
2000.0000 mg | Freq: Once | INTRAVENOUS | Status: AC
  Administered 2017-03-28: 2000 mg via INTRAVENOUS
  Filled 2017-03-28: qty 52.6

## 2017-03-28 NOTE — Patient Instructions (Signed)
[facility] °Discharge Instructions for Patients Receiving Chemotherapy ° °Today you received the following chemotherapy agents Gemzar ° °To help prevent nausea and vomiting after your treatment, we encourage you to take your nausea medication as directed. °  °If you develop nausea and vomiting that is not controlled by your nausea medication, call the clinic.  ° °BELOW ARE SYMPTOMS THAT SHOULD BE REPORTED IMMEDIATELY: °· *FEVER GREATER THAN 100.5 F °· *CHILLS WITH OR WITHOUT FEVER °· NAUSEA AND VOMITING THAT IS NOT CONTROLLED WITH YOUR NAUSEA MEDICATION °· *UNUSUAL SHORTNESS OF BREATH °· *UNUSUAL BRUISING OR BLEEDING °· TENDERNESS IN MOUTH AND THROAT WITH OR WITHOUT PRESENCE OF ULCERS °· *URINARY PROBLEMS °· *BOWEL PROBLEMS °· UNUSUAL RASH °Items with * indicate a potential emergency and should be followed up as soon as possible. ° °Feel free to call the clinic should you have any questions or concerns. The clinic phone number is [phone]. ° °Please show the CHEMO ALERT CARD at check-in to the Emergency Department and triage nurse. ° ° °

## 2017-03-28 NOTE — Patient Instructions (Signed)

## 2017-03-28 NOTE — Progress Notes (Signed)
Patient made aware of 10:00 am appointment on Wednesday 03/29/17 for her lower extremity doppler. Patient verbalized understanding.

## 2017-03-29 ENCOUNTER — Ambulatory Visit (HOSPITAL_COMMUNITY)
Admission: RE | Admit: 2017-03-29 | Discharge: 2017-03-29 | Disposition: A | Discharge status: Home / Self Care | Payer: PPO | Source: Ambulatory Visit | Attending: Nurse Practitioner | Admitting: Nurse Practitioner

## 2017-03-29 ENCOUNTER — Telehealth: Payer: Self-pay | Admitting: Emergency Medicine

## 2017-03-29 DIAGNOSIS — R590 Localized enlarged lymph nodes: Secondary | ICD-10-CM | POA: Diagnosis not present

## 2017-03-29 DIAGNOSIS — M7989 Other specified soft tissue disorders: Secondary | ICD-10-CM | POA: Insufficient documentation

## 2017-03-29 DIAGNOSIS — C250 Malignant neoplasm of head of pancreas: Secondary | ICD-10-CM

## 2017-03-29 DIAGNOSIS — R600 Localized edema: Secondary | ICD-10-CM | POA: Diagnosis not present

## 2017-03-29 LAB — CANCER ANTIGEN 19-9: CA 19-9: 18 U/mL (ref 0–35)

## 2017-03-29 NOTE — Telephone Encounter (Signed)
Per Radiology, Pt is negative for DVT bi-lat Lower extremities. Lacie NP made aware.

## 2017-03-29 NOTE — Progress Notes (Signed)
LE venous duplex prelim: negative for DVT in visualized veins. Landry Mellow, RDMS, RVT Called results to [facility].

## 2017-04-02 NOTE — Progress Notes (Signed)
[place]  [phone] [phone]  Clinic Follow Up Note   Patient Care Team: [name], MD as PCP - General (Internal Medicine) Rigoberto Noel, MD as Consulting Physician (Pulmonary Disease) Larey Dresser, MD as Consulting Physician (Cardiology) Megan Salon, MD as Consulting Physician (Gynecology) Kristeen Miss, MD as Consulting Physician (Neurosurgery) Earlie Server, MD (Orthopedic Surgery) Almedia Balls, MD (Orthopedic Surgery) Lafayette Dragon, MD (Inactive) (Gastroenterology) Renato Shin, MD (Endocrinology) Darleen Crocker, MD (Ophthalmology)   Date of Service: 04/05/2017  CHIEF COMPLAINTS:  Follow up pancreatic Cancer   Oncology History   Cancer Staging Malignant neoplasm of head of pancreas [facility]) Staging form: Exocrine Pancreas, AJCC 8th Edition - Clinical stage from 04/28/2016: Stage IB (cT2, cN0, cM0) - Signed by Truitt Merle, MD on 02/01/2017       Malignant neoplasm of head of pancreas ([place])   02/2016 Initial Diagnosis    Malignant neoplasm of head of pancreas ([place])      03/15/2016 - 03/21/2016 Hospital Admission    Presents with nausea, vomiting, and jaundice   Patient was found hyperbilirubinemic with abnormal abdominal CT suggesting obstructive physiology. Referred for admission and evaluation.      03/15/2016 Imaging    CT Abdomen Pelvis w Contrast 1. Cholecystectomy with borderline biliary duct dilatation. 2. Pancreatic atrophy with moderate pancreatic duct dilatation, followed to the level of the duodenum. 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 Considerations include otherwise occult stricture, periampullary lesion, or main duct intraductal papillary mucinous neoplasm. Given the combination of borderline biliary duct dilatation, moderate pancreatic duct dilatation, and the clinical history of jaundice, consider further evaluation with ERCP. If the patient is not a good ERCP candidate, MRCP with and  without contrast would be another option. 3.  No acute process in the abdomen or pelvis. 4.  Tiny hiatal hernia. 5.  Aortic atherosclerosis.      03/16/2016 Imaging    MRI Abdomen 1. Hypoenhancing lesion in the uncinate process of the pancreas, better demonstrated by MR than on the recent CT scan. The dilated main pancreatic duct and dilated common bile duct abruptly terminate at the cranial margin of this lesion. MR imaging features are concerning for pancreatic adenocarcinoma. ERCP with endoscopic ultrasound would likely prove helpful to further evaluate. 2. No definite metastatic disease in the liver. No peripancreatic lymphadenopathy is identified. Of note, this exam is limited by marked motion degradation and small liver lesions could be obscured. CT scan earlier today demonstrated no peripancreatic lymphadenopathy.      03/18/2016 Procedure    ERCP 1. Malignant-appearing distal bile duct stricturestatus post ERCP with sphincterotomy and plastic biliary stent placement 2. Guidewire finding false lumen with subsequentinjection as described 3. Status post cholecystectomy  Recommendation: 1. Clear liquid diet 2. Unasyn 1.5 g every 6 hours 3. Standard post ERCP observation. Indomethacin suppositories given 4. Trend LFTs 5. Will need endoscopic ultrasound with biopsy with Dr. Ardis Hughs outpatient.      04/28/2016 Pathology Results    Endoscopic [place] and Biopsy FINE NEEDLE ASPIRATION, ENDOSCOPIC, PANCREAS UNCINATE (SPECIMEN 1 OF 1 COLLECTED 04/28/16): MALIGNANT CELLS CONSISTENT WITH ADENOCARCINOMA.      05/04/2016 -  Hospital Admission    Presents with fever, abdominal pain, malaise, confusion. She was treated for acute cholangitis.       05/05/2016 Imaging    CT Abdomen Pelvis w Contrast Interval placement of a bile duct stent with some decompression of the bile ducts. Mild residual biliary dilatation. Pancreatic ductal dilatation and atrophy. Known  pancreatic mass lesion is  not well depicted at CT. No evidence of bowel obstruction or inflammation.      05/05/2016 Imaging    CT Head w/o Contrast 05/05/16 IMPRESSION: No acute intracranial pathology.      05/06/2016 Procedure    ERCP and Bilary stent placement  Imaging demonstrating removal of endoscopic common bile duct stent. The stent was not able to be replaced.       05/07/2016 Imaging    CT Chest wo Contrast  1. New small right and trace left pleural effusions. Mild cardiomegaly. 2. Borderline enlarged right hilar lymph node at 1 cm in short axis. 3. No definite findings of metastatic disease to the chest. 4. Centrilobular emphysema. 5. High density in the visualized part of the dorsal pancreatic duct, probably contrast medium. 6. Subacute healing fracture of the right seventh rib laterally. 7. Atherosclerosis, including the left anterior descending coronary artery.      05/07/2016 Procedure    On 05/07/16, percutaneous transhepatic cholangiogram and placement of a 39F internal/external biliary drain was performed by IR with aspirated purulent bile sent for culture. Bile culture grew pantoea species, enterococcus faecalis, viridans streptococcus.      07/11/2016 - 07/21/2016 Radiation Therapy    Diagnosis:   cT2N0M0  adenocarcinoma of the pancreas  Indication for treatment:  Curative  Radiation treatment dates:   07/11/16 - 07/21/16  Site/dose:   Pancreas: 33 Gy in 5 fractions  Beams/energy:   SBRT/SRT-VMAT // 10X-FFF Photon  Narrative: The patient tolerated radiation treatment relatively well. The patient complained of diarrhea during treatment. She was instructed to purchase Imodium.      08/2016 - 10/2016 Chemotherapy    She had chemotherapy consistent of gemcitabine and abraxine for 2-3 months under the care of Dr. Berneice Gandy at [facility] with her last treatment being about 10/2016.        02/20/2017 Imaging    CT CAP 02/20/17  IMPRESSION: 1. Response to therapy of the primary pancreatic  head/uncinate process lesion. Please note that delineation of the tumor is difficult secondary to anatomic distortion from therapy. 2. No new or progressive disease identified. 3. Removal of biliary stent, with pneumobilia and borderline similar intrahepatic biliary duct dilatation. 4.  No acute process or evidence of metastatic disease in the chest. 5.  Possible constipation. 6. Coronary artery atherosclerosis. Aortic Atherosclerosis (ICD10-I70.0). 7.  Emphysema (ICD10-J43.9). 8. Pulmonary artery enlargement suggests pulmonary arterial hypertension.      02/22/2017 -  Chemotherapy    Single agent Gemcitabine every 2-3 weeks on and 1 week off, starting 02/22/17         HISTORY OF PRESENTING ILLNESS (05/19/2016):  Melissa Myers 72 y.o. female is here because the patient presented to the ED on 03/15/16 due to weakness, hyperglycemia, jaundice, abdominal pain, mild confusion, and chronic diarrhea. Physical exam at the time revealed abdominal distension and diffuse tenderness. Labs found the patient hyperbilirubinemic. CT scan of the abdomen showed pancreatic atrophy with moderate pancreatic duct dilatation, followed to the level of the duodenum. 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 Considerations include otherwise occult stricture, periampullary lesion, or main duct intraductal papillary mucinous neoplasm. MRI of the abdomen the same day showed a 2.2 x 2.8 cm hypoenhancing lesion in the uncinate process of the pancreas, better demonstrated by MRI than on the recent CT scan. The dilated main pancreatic duct and dilated common bile duct abruptly terminate at the cranial margin of this lesion. MRI features are concerning for pancreatic adenocarcinoma.CA 19-9 on 03/17/16 was  107; elevated.  The patient underwent ERCP, with biliary sphincterotomy and biliary stent placement by Dr. Henrene Pastor 03/18/16. The patient was subsequently discharged on 03/21/16.  On 03/27/16, the patient presented to the ED on  03/29/16 for flu like symptoms. She was Influenza A positive. She was given fluids and discharged with Tamiflu. She presented back to the ED the following day for worsening symptoms. She was started on antibiotics for possible developing pneumonia giving a productive cough.  Given enough time to recover from her hospitalization and the flu, the patient underwent biopsy of the pancreatic mass by Dr. Ardis Hughs on 04/28/16 revealing adenocarcinoma.  Since then, the patient presented to the ED on 05/04/16 for a headaches, hyperglycemia, confusion, abdominal pain, and nausea. Given presentation of fever/confusion, she was admitted to the hospital.  CT of the head on 05/05/16 was negative. CT of the abd/pelvis on 05/05/16 showed interval placement of a bile duct stent with some decompression of the bile ducts, mild residual biliary dilatation, and pancreatic ductal dilatation and atrophy.  She was diagnosed with cholangitis and recurrent jaundice. Zosyn was started. ERCP on 05/06/16 showed a clogged stent which was removed, though a new stent was not able to be placed across a malignant biliary stricture. On 05/07/16, percutaneous transhepatic cholangiogram and placement of a 51F internal/external biliary drain was performed by IR with aspirated purulent bile sent for culture. Blood cultures remained negative, though bile culture grew pantoea species, enterococcus faecalis, viridans streptococcus.Urine culture had also grown Klebsiella pneumoniae, sensitive to Zosyn.  I see the patient today in an inpatient setting to discuss treatment options for the management of her pancreatic adenocarcinoma. I met patient, her husband and son in her room. According to patient's family, she used to be independent, does self care and housework, walks with a walker. Due to fatigue, anorexia, she has not been able to perform her daily activities in the past 4-6 weeks, and has spent most time in bed and chair for the past few weeks.      CURRENT THERAPY:  single agent gemcitabine 2 weeks on and 1 week off starting 02/22/17    INTERIM HISTORY:  Melissa Myers returns for follow-up. She was accompanied by her niece today. She reports that she has done well with chemotherapy and she had fatigue that occurs following chemotherapy.   Since her last visit to the office, she had a doppler study of her LLE on 03/29/2017 with results of: Negative for DVT. She was prescribed Keflex BID.   On review of systems, pt reports a painful knot in her RLE, redness to her RLE x 3-4 weeks, peeling to foot, dry skin to bilateral feet. She denies LLE pain or increased redness, and any other symptoms.     MEDICAL HISTORY:  Past Medical History:  Diagnosis Date  . Anemia   . Cancer [facility])    Pancreatic  . Cellulitis    LOWER EXTREMITIES  . Chronic diarrhea    a/w nausea - felt related to IBS  . Deaf    left side only  . Diastolic CHF ([place])   . Disc degeneration, lumbar   . GERD (gastroesophageal reflux disease)   . Hyperlipidemia    hx rhabdo on statins  . Hypertension   . Neuropathy    feet, toes and fingers  . On home oxygen therapy    uses oxygen 2 liters min per [place] at night and prn during day  . OSA (obstructive sleep apnea)    05/2009 sleep study -  refuses CPAP  . Osteoarthritis   . RLS (restless legs syndrome)   . Shortness of breath    chronic  . Stasis dermatitis   . Type II or unspecified type diabetes mellitus without mention of complication, not stated as uncontrolled    insulin dep    SURGICAL HISTORY: Past Surgical History:  Procedure Laterality Date  . BILIARY STENT PLACEMENT N/A 05/06/2016   Procedure: BILIARY STENT PLACEMENT;  Surgeon: Gatha Mayer, MD;  Location: [place];  Service: Endoscopy;  Laterality: N/A;  . CHOLECYSTECTOMY  1997  . COLONOSCOPY N/A 12/03/2012   Procedure: COLONOSCOPY;  Surgeon: Lafayette Dragon, MD;  Location: [facility];  Service: Endoscopy;  Laterality: N/A;  . ENDOSCOPIC RETROGRADE  CHOLANGIOPANCREATOGRAPHY (ERCP) WITH PROPOFOL N/A 05/06/2016   Procedure: ENDOSCOPIC RETROGRADE CHOLANGIOPANCREATOGRAPHY (ERCP) WITH PROPOFOL;  Surgeon: Gatha Mayer, MD;  Location: [place];  Service: Endoscopy;  Laterality: N/A;  . ERCP N/A 03/18/2016   Procedure: ENDOSCOPIC RETROGRADE CHOLANGIOPANCREATOGRAPHY (ERCP);  Surgeon: Irene Shipper, MD;  Location: [facility];  Service: Endoscopy;  Laterality: N/A;  . EUS N/A 04/28/2016   Procedure: UPPER ENDOSCOPIC ULTRASOUND (EUS) LINEAR;  Surgeon: [name], MD;  Location: [facility];  Service: Endoscopy;  Laterality: N/A;  . IR CHOLANGIOGRAM EXISTING TUBE  06/09/2016  . IR GENERIC HISTORICAL  05/07/2016   IR BILIARY DRAIN PLACEMENT WITH CHOLANGIOGRAM 05/07/2016 Jacqulynn Cadet, MD [facility]-INTER[facility]  . IR GENERIC HISTORICAL  05/19/2016   IR BILIARY STENT(S) EXISTING ACCESS INC DILATION CATH EXCHANGE 05/19/2016 Jacqulynn Cadet, MD [facility]-INTERV RAD  . TONSILLECTOMY  1970  . TUBAL LIGATION  1980  . UMBILICAL HERNIA REPAIR  1995  . uterine polyp removal  2008    SOCIAL HISTORY: Social History   Socioeconomic History  . Marital status: Married    Spouse name: Not on file  . Number of children: Not on file  . Years of education: Not on file  . Highest education level: Not on file  Social Needs  . Financial resource strain: Not on file  . Food insecurity - worry: Not on file  . Food insecurity - inability: Not on file  . Transportation needs - medical: Not on file  . Transportation needs - non-medical: Not on file  Occupational History  . Not on file  Tobacco Use  . Smoking status: Never Smoker  . Smokeless tobacco: Never Used  Substance and Sexual Activity  . Alcohol use: No  . Drug use: No  . Sexual activity: No  Other Topics Concern  . Not on file  Social History Narrative   Lives with spouse and mother. Retired Network engineer, now housewife    FAMILY HISTORY: Family History  Problem Relation Age of Onset  . Heart disease  Mother   . Heart attack Mother 75  . Heart disease Father   . Heart attack Father 47  . Heart disease Unknown        family history  . Stomach cancer Paternal Grandmother   . Lung cancer Paternal Grandfather   . CVA Unknown        several aunts  . Heart attack Unknown        several aunts and an uncle  . Colon cancer Neg Hx     ALLERGIES:  is allergic to morphine; statins; sulfa antibiotics; cymbalta [duloxetine hcl]; sulfasalazine; levemir [insulin detemir]; and zinc.  MEDICATIONS:  Current Outpatient Medications  Medication Sig Dispense Refill  . aspirin EC 81 MG tablet Take 1 tablet (81 mg  total) by mouth daily.    . carvedilol (COREG) 25 MG tablet TAKE ONE TABLET TWICE DAILY WITH FOOD 60 tablet 5  . Cholecalciferol (VITAMIN D3) 2000 units capsule Take 1 capsule (2,000 Units total) by mouth daily. 90 capsule 3  . Continuous Blood Gluc Sensor (FREESTYLE LIBRE SENSOR SYSTEM) MISC Use to check sugars 1 each 0  . diphenoxylate-atropine (LOMOTIL) 2.5-0.025 MG tablet Take 1-2 tablets by mouth 4 (four) times daily as needed for diarrhea or loose stools. 120 tablet 2  . gabapentin (NEURONTIN) 300 MG capsule TAKE TWO CAPSULES FOUR TIMES DAILY 240 capsule 1  . hydrALAZINE (APRESOLINE) 25 MG tablet TAKE 1 TABLET (25 MG TOTAL) BY MOUTH 3 (THREE) TIMES DAILY. 90 tablet 3  . insulin aspart (NOVOLOG) 100 UNIT/ML injection Inject 0-20 Units into the skin 3 (three) times daily with meals. . CBG 70 - 120: 0 units CBG 121 - 150: 3 units CBG 151 - 200: 4 units CBG 201 - 250: 7 units CBG 251 - 300: 11 units CBG 301 - 350: 15 units CBG 351 - 400: 20 units    . insulin glargine (LANTUS) 100 UNIT/ML injection Inject 0.3 mLs (30 Units total) into the skin daily.    [name] lactulose (CHRONULAC) 10 GM/15ML solution Take 10 g by mouth daily as needed for mild constipation.    . lidocaine (LIDODERM) 5 % Place 1 patch daily onto the skin. Remove & Discard patch within 12 hours or as directed by MD 30 patch 0    . lidocaine-prilocaine (EMLA) cream Apply to port site prior to being accessed    . lipase/protease/amylase (CREON) 36000 UNITS CPEP capsule 2 po prior to snacks; 4 po prior to meals    . meclizine (ANTIVERT) 25 MG tablet Take 25 mg 2 (two) times daily as needed by mouth for dizziness.     . metFORMIN (GLUCOPHAGE) 500 MG tablet Take 500 mg by mouth daily with breakfast.    . mirtazapine (REMERON) 30 MG tablet Take 1 tablet (30 mg total) by mouth at bedtime. 30 tablet 2  . mupirocin ointment (BACTROBAN) 2 % Apply 1 application topically daily. 90 g 0  . ondansetron (ZOFRAN) 8 MG tablet Take 1 tablet (8 mg total) by mouth every 8 (eight) hours as needed for nausea or vomiting. 30 tablet 2  . ONETOUCH VERIO test strip USE AS INSTRUCTED UP TO 4 TIMES DAILY 200 each 11  . oxyCODONE (OXY IR/ROXICODONE) 5 MG immediate release tablet Take 1 tablet (5 mg total) by mouth 2 (two) times daily as needed for severe pain. 60 tablet 0  . pregabalin (LYRICA) 75 MG capsule Take 1 capsule (75 mg total) daily by mouth. 30 capsule 3  . rOPINIRole (REQUIP XL) 4 MG 24 hr tablet TAKE ONE TABLET AT BEDTIME 30 tablet 5  . rOPINIRole (REQUIP) 2 MG tablet Take 1 tablet (2 mg total) by mouth 3 (three) times daily. 90 tablet 2  . sertraline (ZOLOFT) 50 MG tablet Take 50 mg by mouth daily.    . silver sulfADIAZINE (SILVADENE) 1 % cream Apply 1 application daily topically. 50 g 0  . spironolactone (ALDACTONE) 25 MG tablet Take 1 tablet (25 mg total) by mouth daily. 90 tablet 3  . traMADol (ULTRAM) 50 MG tablet Take 1 tablet (50 mg total) by mouth every 6 (six) hours as needed. (Patient taking differently: Take 50 mg every 6 (six) hours as needed by mouth for moderate pain. ) 60 tablet 0  . clindamycin (CLEOCIN)  300 MG capsule Take 1 capsule (300 mg total) by mouth 3 (three) times daily. 21 capsule 0   No current facility-administered medications for this visit.    Facility-Administered Medications Ordered in Other Visits   Medication Dose Route Frequency Provider Last Rate Last Dose  . ondansetron (ZOFRAN) injection 8 mg  8 mg Intravenous Once Cira Rue K, NP      . sodium chloride flush (NS) 0.9 % injection 10 mL  10 mL Intracatheter PRN Truitt Merle, MD   10 mL at 02/22/17 1729    REVIEW OF SYSTEMS:   Constitutional: (+) fatigue following chemotherapy.  Eyes: Denies blurriness of vision, double vision or watery eyes Ears, nose, mouth, throat, and face: Denies mucositis or sore throat Respiratory: Denies cough, dyspnea or wheezes Cardiovascular: Denies palpitation, chest discomfort or (+) chronic BLE extremity swelling Gastrointestinal:  (+) significant diarrhea, orange residue of stool  Skin: Denies abnormal skin rashes (+) redness to RLE Lymphatics: Denies new lymphadenopathy or easy bruising MSK: (+) arthritis  Neurological:(+) neuropathy (+) change in short term memory  Behavioral/Psych: Mood is stable, no new changes  All other systems were reviewed with the patient and are negative.  PHYSICAL EXAMINATION:  ECOG PERFORMANCE STATUS: 3 - Symptomatic, >50% confined to bed  Vitals:   04/05/17 1144  BP: (!) 142/74  Pulse: (!) 58  Resp: (!) 24  Temp: 97.8 F (36.6 C)  SpO2: 99%   Filed Weights   04/05/17 1144  Weight: 205 lb 3.2 oz (93.1 kg)    GENERAL: Drowsy, sitting in wheelchair, wrapped with blankets SKIN: skin color, texture, turgor are normal, no rashes, (+) skin hyperpigmentation on bilateral lower extremity above the ankles to mid shin, (+) diffuse mild redness, and warmness at right mid-shin to below knee, with significant tenderness, bilateral lower extremity edema. EYES: normal, conjunctiva are pink and non-injected, sclera clear OROPHARYNX:no exudate, no erythema and lips, buccal mucosa, and tongue normal  NECK: supple, thyroid normal size, non-tender, without nodularity LYMPH:  no palpable lymphadenopathy in the cervical, axillary or inguinal LUNGS: clear to auscultation and  percussion with normal breathing effort HEART: regular rate & rhythm and no murmurs and no lower extremity edema ABDOMEN: tenderness to epigastrium, but overall abdomen is soft. No organomegaly, bowel sounds normal. Musculoskeletal: no cyanosis of digits and no clubbing. Bilateral symmetric trace edema to BLE.  PSYCH: alert & oriented x 3 with fluent speech NEURO: no focal motor/sensory deficits   LABORATORY DATA:  I have reviewed the data as listed CBC Latest Ref Rng & Units 04/05/2017 03/28/2017 03/14/2017  WBC 3.9 - 10.3 K/uL 6.1 7.1 5.3  Hemoglobin 11.6 - 15.9 g/dL 8.3(L) 8.9(L) 8.7(L)  Hematocrit 34.8 - 46.6 % 26.6(L) 28.2(L) 28.4(L)  Platelets 145 - 400 K/uL 266 233 467(H)   CMP Latest Ref Rng & Units 04/05/2017 03/28/2017 03/14/2017  Glucose 70 - 140 mg/dL 411(H) 288(H) 356(H)  BUN 7 - 26 mg/dL 29(H) 23 32(H)  Creatinine 0.60 - 1.10 mg/dL 1.09 0.94 1.08  Sodium 136 - 145 mmol/L 136 138 136  Potassium 3.5 - 5.1 mmol/L 3.6 4.2 3.9  Chloride 98 - 109 mmol/L 99 101 99  CO2 22 - 29 mmol/L _0 Calcium 8.4 - 10.4 mg/dL 9.1 9.0 8.9  Total Protein 6.4 - 8.3 g/dL 6.5 6.9 6.8  Total Bilirubin 0.2 - 1.2 mg/dL 0.4 0.2 0.3  Alkaline Phos 40 - 150 U/L 279(H) 242(H) 340(H)  AST 5 - 34 U/L 70(H) 31 63(H)  ALT  0 - 55 U/L 51 27 40     RADIOGRAPHIC STUDIES: I have personally reviewed the radiological images as listed and agreed with the findings in the report.    CT CAP 02/20/17  IMPRESSION: 1. Response to therapy of the primary pancreatic head/uncinate process lesion. Please note that delineation of the tumor is difficult secondary to anatomic distortion from therapy. 2. No new or progressive disease identified. 3. Removal of biliary stent, with pneumobilia and borderline similar intrahepatic biliary duct dilatation. 4.  No acute process or evidence of metastatic disease in the chest. 5.  Possible constipation. 6. Coronary artery atherosclerosis. Aortic  Atherosclerosis (ICD10-I70.0). 7.  Emphysema (ICD10-J43.9). 8. Pulmonary artery enlargement suggests pulmonary arterial hypertension.    CT Angio Chest PE W or WO Contrast, 12/08/2016 IMPRESSION: No evidence of pulmonary embolism. Thickening of the pulmonic valve ; if there is clinical concern for valvular disease consider echocardiography. Mild dilatation of the main pulmonary artery which may reflect pulmonary artery hypertension. Atherosclerotic calcifications aorta and coronary arteries. Aortic Atherosclerosis (ICD10-I70.0).  CT Chest WO Contrast 05/07/16 Carlean Purl IMPRESSION: 1. New small right and trace left pleural effusions. Mild cardiomegaly. 2. Borderline enlarged right hilar lymph node at 1 cm in short axis. 3. No definite findings of metastatic disease to the chest. 4. Centrilobular emphysema. 5. High density in the visualized part of the dorsal pancreatic duct, probably contrast medium. 6. Subacute healing fracture of the right seventh rib laterally. 7. Atherosclerosis, including the left anterior descending coronary artery.  DG ERCP 05/06/16 Carlean Purl IMPRESSION: Imaging demonstrating removal of endoscopic common bile duct stent. The stent was not able to be replaced.   CT Abd, Head, Pelvis 05/05/16 IMPRESSION: Interval placement of a bile duct stent with some decompression of the bile ducts. Mild residual biliary dilatation. Pancreatic ductal dilatation and atrophy. Known pancreatic mass lesion is not well depicted at CT. No evidence of bowel obstruction or inflammation.   ASSESSMENT & PLAN: 72 y.o.female with PMH of IDDM with neuropathy, HFpEF, HTN, OSA on intermittent home oxygen, and recently diagnosed pancreatic mass s/p biliary stent 04/28/2016 who was recently treated for acute cholangitis, status post cutaneous biliary drainage. She had biliary drainage tube exchange with peri-stent placement by IR Dr. Laurence Ferrari today  1. Pancreatic adenocarcinoma, borderline  resectable, cT2N0M0, stage IB --I previously reviewed her CT scan findings, her pancreatic cancer is probably resectable, no evidence of distant metastasis on CT scans. She was evaluated by general surgeon Dr. Barry Dienes. However she may not be a good candidate due to her advanced age, comorbidities, malnutrition and a very poor performance status. -We previously reviewed the natural history of pancreatic cancer, and a very high risk of recurrence after surgical resection. Most people requires neoadjuvant or adjuvant chemotherapy and possible radiation -We previously have discussed her case in our she had tumor board. Dr. Barry Dienes did not think she was a candidate for whipple surgery. I recommended RT.  -She completed Pancreatic SBRT with Dr. Lisbeth Renshaw 07/11/16 - 07/21/16. -She had chemotherapy consistent of gemcitabine and abraxane for 2-3 months under the care of Dr. Berneice Gandy at [facility] with her last treatment being about 10/2016.  Her restaging CT scan in September 2018 at [facility] showed stable disease. -Patient was not a candidate for surgery via Dr. Mariah Milling at [facility]  -I reviewed the natural course of pancreatic cancer, and her disease is not curable since her surgery is not an option. The goal of therapy is to prolong her life and preserve her quality of life.  Discussed with patient and her friend regarding continuing chemotherapy vs supportive care alone.  After lengthy discussion with patient and her husband, she chose to proceed with chemo. I suggest single agent gemcitabine 2 weeks on and 1 week off. She is agreeable.  -We reviewed her CT CAP from 02/20/17 which shows her tumor has shrunk and no evidence of metastasis. She has good response to her treatment.  -She has started single agent Gemcitabine weekly for 2 weeks, every 21 days.  I do not think she is a candidate for gemcitabine and Abraxane, which she was previously on. She is tolerating well overall -To her right lower extremity cellulitis, I will hold chemotherapy  today. -next treatment in 2 weeks   2. RLE cellulitis  -She was given a course of Keflex last week, no significant improvement.  Doppler was negative for DVT -She is allergic to sulfa, I will give her 7 days of clindamycin -I encouraged her to use compression stockings, elevate legs, and slight increase her dose of Lasix, to improve her leg edema, hopefully that would improve her cellulitis also   3. Diarrhea, possibly related to pancreatic insufficiency  -Rule out C. Diff with lab today. She stool was loose but not watery, and c-diff PCR was not to perform.  No increased white count, less likely this is infectious. -Previously encouraged her to continue Creon -will treat patient diarrhea with lomotil Rx.  -Diarrhea has increased, I encouraged her to titrate up her lomotil up to 8 tablets a day with imodium as needed. I refilled lomotil previously  -I previously advised her to increase lomotil up to 6 tabs a day and to take her creon in the morning and then with every meal.   4. IDDM, morbid obesity -follow up with PCP  5. Chronic diastolic heart failure -follow up with PCP  6. Malnutrition and deconditioning -I previously encouraged her to continue physical therapy at his rehabilitation  7. Chronic Venous Stasis  -Patient with negative doppler study on 03/29/2017 -Advised the patient to elevate her legs at home while resting to improve venous return.  -Advised that the patient start using compression stockings to aid with BLE swelling -Advised the patient to began taking 1 tablet of Lasix daily for 3-5 days and then proceed to 0.5 tablet as prescribed. Patient niece advised to call the office with the correct dosage of Lasix -Advised that the patient consume an increased amount of water due to the lasix prescription.  -Will hold chemotherapy for 2 weeks and then start next cycle  8. Goal of care discussion  -We again discussed the incurable nature of her cancer, and the overall poor  prognosis, especially if she does not have good response to chemotherapy or progress on chemo -The patient understands the goal of care is palliative. -I previously recommended DNR/DNI, she will think about it    PLAN: -Prescribe Clindamycin for probable cellulitis of the RLE -hold chemo today  -Lab, flush, f/u with me or Lacie and chemo gemcitabine in 2 and 3 weeks    No orders of the defined types were placed in this encounter.   All questions were answered. The patient knows to call the clinic with any problems, questions or concerns. I spent 20 minutes counseling the patient face to face. The total time spent in the appointment was 25 minutes and more than 50% was on counseling.  This document serves as a record of services personally performed by Truitt Merle, MD. It was created on her  behalf by Steva Colder, a trained medical scribe. The creation of this record is based on the scribe's personal observations and the provider's statements to them.   I have reviewed the above documentation for accuracy and completeness, and I agree with the above.   Truitt Merle, MD 04/05/2017

## 2017-04-05 ENCOUNTER — Inpatient Hospital Stay (HOSPITAL_BASED_OUTPATIENT_CLINIC_OR_DEPARTMENT_OTHER): Payer: PPO | Admitting: Hematology

## 2017-04-05 ENCOUNTER — Inpatient Hospital Stay: Payer: PPO

## 2017-04-05 ENCOUNTER — Encounter: Payer: Self-pay | Admitting: Hematology

## 2017-04-05 VITALS — BP 142/74 | HR 58 | Temp 97.8°F | Resp 24 | Ht 65.0 in | Wt 205.2 lb

## 2017-04-05 DIAGNOSIS — E669 Obesity, unspecified: Secondary | ICD-10-CM | POA: Diagnosis not present

## 2017-04-05 DIAGNOSIS — Z452 Encounter for adjustment and management of vascular access device: Secondary | ICD-10-CM

## 2017-04-05 DIAGNOSIS — E46 Unspecified protein-calorie malnutrition: Secondary | ICD-10-CM

## 2017-04-05 DIAGNOSIS — M25561 Pain in right knee: Secondary | ICD-10-CM

## 2017-04-05 DIAGNOSIS — R5383 Other fatigue: Secondary | ICD-10-CM | POA: Diagnosis not present

## 2017-04-05 DIAGNOSIS — K449 Diaphragmatic hernia without obstruction or gangrene: Secondary | ICD-10-CM | POA: Diagnosis not present

## 2017-04-05 DIAGNOSIS — I5030 Unspecified diastolic (congestive) heart failure: Secondary | ICD-10-CM | POA: Diagnosis not present

## 2017-04-05 DIAGNOSIS — I2510 Atherosclerotic heart disease of native coronary artery without angina pectoris: Secondary | ICD-10-CM

## 2017-04-05 DIAGNOSIS — E119 Type 2 diabetes mellitus without complications: Secondary | ICD-10-CM

## 2017-04-05 DIAGNOSIS — Z8781 Personal history of (healed) traumatic fracture: Secondary | ICD-10-CM

## 2017-04-05 DIAGNOSIS — G629 Polyneuropathy, unspecified: Secondary | ICD-10-CM

## 2017-04-05 DIAGNOSIS — C250 Malignant neoplasm of head of pancreas: Secondary | ICD-10-CM

## 2017-04-05 DIAGNOSIS — Z7982 Long term (current) use of aspirin: Secondary | ICD-10-CM

## 2017-04-05 DIAGNOSIS — J439 Emphysema, unspecified: Secondary | ICD-10-CM

## 2017-04-05 DIAGNOSIS — Z9049 Acquired absence of other specified parts of digestive tract: Secondary | ICD-10-CM

## 2017-04-05 DIAGNOSIS — Z794 Long term (current) use of insulin: Secondary | ICD-10-CM

## 2017-04-05 DIAGNOSIS — R197 Diarrhea, unspecified: Secondary | ICD-10-CM | POA: Diagnosis not present

## 2017-04-05 DIAGNOSIS — Z9981 Dependence on supplemental oxygen: Secondary | ICD-10-CM

## 2017-04-05 DIAGNOSIS — E785 Hyperlipidemia, unspecified: Secondary | ICD-10-CM

## 2017-04-05 DIAGNOSIS — M7989 Other specified soft tissue disorders: Secondary | ICD-10-CM | POA: Diagnosis not present

## 2017-04-05 DIAGNOSIS — I5032 Chronic diastolic (congestive) heart failure: Secondary | ICD-10-CM

## 2017-04-05 DIAGNOSIS — Z923 Personal history of irradiation: Secondary | ICD-10-CM

## 2017-04-05 DIAGNOSIS — I10 Essential (primary) hypertension: Secondary | ICD-10-CM

## 2017-04-05 DIAGNOSIS — L03115 Cellulitis of right lower limb: Secondary | ICD-10-CM

## 2017-04-05 DIAGNOSIS — IMO0001 Reserved for inherently not codable concepts without codable children: Secondary | ICD-10-CM

## 2017-04-05 DIAGNOSIS — E1165 Type 2 diabetes mellitus with hyperglycemia: Secondary | ICD-10-CM

## 2017-04-05 DIAGNOSIS — I517 Cardiomegaly: Secondary | ICD-10-CM

## 2017-04-05 DIAGNOSIS — Z8619 Personal history of other infectious and parasitic diseases: Secondary | ICD-10-CM

## 2017-04-05 DIAGNOSIS — Z5111 Encounter for antineoplastic chemotherapy: Secondary | ICD-10-CM

## 2017-04-05 DIAGNOSIS — M5136 Other intervertebral disc degeneration, lumbar region: Secondary | ICD-10-CM

## 2017-04-05 DIAGNOSIS — K219 Gastro-esophageal reflux disease without esophagitis: Secondary | ICD-10-CM

## 2017-04-05 DIAGNOSIS — D649 Anemia, unspecified: Secondary | ICD-10-CM

## 2017-04-05 DIAGNOSIS — Z95828 Presence of other vascular implants and grafts: Secondary | ICD-10-CM

## 2017-04-05 DIAGNOSIS — Z79899 Other long term (current) drug therapy: Secondary | ICD-10-CM

## 2017-04-05 DIAGNOSIS — I700 Atherosclerosis of aorta: Secondary | ICD-10-CM

## 2017-04-05 LAB — CBC WITH DIFFERENTIAL/PLATELET
BASOS ABS: 0 10*3/uL (ref 0.0–0.1)
Basophils Relative: 1 %
EOS ABS: 0.1 10*3/uL (ref 0.0–0.5)
Eosinophils Relative: 1 %
HCT: 26.6 % — ABNORMAL LOW (ref 34.8–46.6)
HEMOGLOBIN: 8.3 g/dL — AB (ref 11.6–15.9)
LYMPHS ABS: 1.6 10*3/uL (ref 0.9–3.3)
Lymphocytes Relative: 27 %
MCH: 25.6 pg (ref 25.1–34.0)
MCHC: 31.2 g/dL — ABNORMAL LOW (ref 31.5–36.0)
MCV: 82.1 fL (ref 79.5–101.0)
Monocytes Absolute: 0.5 10*3/uL (ref 0.1–0.9)
Monocytes Relative: 8 %
Neutro Abs: 3.8 10*3/uL (ref 1.5–6.5)
Neutrophils Relative %: 63 %
Platelets: 266 10*3/uL (ref 145–400)
RBC: 3.24 MIL/uL — AB (ref 3.70–5.45)
RDW: 21.9 % — ABNORMAL HIGH (ref 11.2–14.5)
WBC: 6.1 10*3/uL (ref 3.9–10.3)

## 2017-04-05 LAB — COMPREHENSIVE METABOLIC PANEL
ALBUMIN: 2.5 g/dL — AB (ref 3.5–5.0)
ALK PHOS: 279 U/L — AB (ref 40–150)
ALT: 51 U/L (ref 0–55)
AST: 70 U/L — AB (ref 5–34)
Anion gap: 11 (ref 3–11)
BUN: 29 mg/dL — ABNORMAL HIGH (ref 7–26)
CALCIUM: 9.1 mg/dL (ref 8.4–10.4)
CO2: 26 mmol/L (ref 22–29)
CREATININE: 1.09 mg/dL (ref 0.60–1.10)
Chloride: 99 mmol/L (ref 98–109)
GFR calc non Af Amer: 50 mL/min — ABNORMAL LOW (ref >60)
GFR, EST AFRICAN AMERICAN: 58 mL/min — AB (ref >60)
GLUCOSE: 411 mg/dL — AB (ref 70–140)
Potassium: 3.6 mmol/L (ref 3.5–5.1)
SODIUM: 136 mmol/L (ref 136–145)
Total Bilirubin: 0.4 mg/dL (ref 0.2–1.2)
Total Protein: 6.5 g/dL (ref 6.4–8.3)

## 2017-04-05 MED ORDER — HEPARIN SOD (PORK) LOCK FLUSH 100 UNIT/ML IV SOLN
500.0000 [IU] | Freq: Once | INTRAVENOUS | Status: AC
  Administered 2017-04-05: 500 [IU]
  Filled 2017-04-05: qty 5

## 2017-04-05 MED ORDER — SODIUM CHLORIDE 0.9% FLUSH
10.0000 mL | INTRAVENOUS | Status: DC | PRN
  Administered 2017-04-05: 10 mL via INTRAVENOUS
  Filled 2017-04-05: qty 10

## 2017-04-05 MED ORDER — CLINDAMYCIN HCL 300 MG PO CAPS: 300.0000 mg | ORAL_CAPSULE | Freq: Three times a day (TID) | ORAL | 0 refills | Status: DC

## 2017-04-05 MED ORDER — SODIUM CHLORIDE 0.9 % IJ SOLN
10.0000 mL | Freq: Once | INTRAMUSCULAR | Status: AC
Start: 2017-04-05 — End: 2017-04-05
  Administered 2017-04-05: 10 mL
  Filled 2017-04-05: qty 10

## 2017-04-10 ENCOUNTER — Telehealth (HOSPITAL_COMMUNITY): Payer: Self-pay | Admitting: *Deleted

## 2017-04-10 NOTE — Telephone Encounter (Signed)
Home RN confirmed patient is currently taking 100 mg of torsemide that was started in January by Dr. Estill Cotta.

## 2017-04-10 NOTE — Telephone Encounter (Signed)
Agree. But please confirm what diuretic she is taking.  Lasix mentioned in oncology note from 04/05/17, but neither lasix nor torsemide on med list.     Lollie Marrow, PA-C 04/10/2017 11:58 AM

## 2017-04-10 NOTE — Telephone Encounter (Signed)
Advanced Heart Failure Triage Encounter  Patient Name: Melissa Myers  Date of Call: 04/10/17  Problem: wt gain  Home health RN called to report that patient has gained 11 lbs since Friday.  She is overdue for appointment in our clinic and RN is asking if she can be seen.  She has not been seen since October 2018.  At that time patient was not taking torsemide.  Since then, Dr. Estill Cotta had restarted her on 100mg  of torsemide daily.  Patient also has bilateral pitting edema, no complaints of sob or chest pain.   Plan:  Patient has been added to APP schedule for this wednesday.  I advised RN that if patient needed anything in the meantime to contact Dr. Estill Cotta since he was the provider that restarted torsemide.   Darron Doom, RN

## 2017-04-12 ENCOUNTER — Ambulatory Visit (HOSPITAL_COMMUNITY)
Admission: RE | Admit: 2017-04-12 | Discharge: 2017-04-12 | Disposition: A | Discharge status: Home / Self Care | Payer: PPO | Source: Ambulatory Visit | Attending: Internal Medicine | Admitting: Internal Medicine

## 2017-04-12 ENCOUNTER — Encounter (HOSPITAL_COMMUNITY): Payer: Self-pay

## 2017-04-12 ENCOUNTER — Other Ambulatory Visit: Payer: PPO

## 2017-04-12 ENCOUNTER — Ambulatory Visit: Payer: PPO

## 2017-04-12 VITALS — BP 118/54 | HR 66 | Wt 214.8 lb

## 2017-04-12 DIAGNOSIS — L03115 Cellulitis of right lower limb: Secondary | ICD-10-CM | POA: Insufficient documentation

## 2017-04-12 DIAGNOSIS — E7849 Other hyperlipidemia: Secondary | ICD-10-CM

## 2017-04-12 DIAGNOSIS — E1142 Type 2 diabetes mellitus with diabetic polyneuropathy: Secondary | ICD-10-CM | POA: Diagnosis not present

## 2017-04-12 DIAGNOSIS — E1151 Type 2 diabetes mellitus with diabetic peripheral angiopathy without gangrene: Secondary | ICD-10-CM | POA: Insufficient documentation

## 2017-04-12 DIAGNOSIS — E785 Hyperlipidemia, unspecified: Secondary | ICD-10-CM | POA: Insufficient documentation

## 2017-04-12 DIAGNOSIS — R011 Cardiac murmur, unspecified: Secondary | ICD-10-CM | POA: Insufficient documentation

## 2017-04-12 DIAGNOSIS — Z794 Long term (current) use of insulin: Secondary | ICD-10-CM | POA: Diagnosis not present

## 2017-04-12 DIAGNOSIS — C250 Malignant neoplasm of head of pancreas: Secondary | ICD-10-CM | POA: Diagnosis not present

## 2017-04-12 DIAGNOSIS — I10 Essential (primary) hypertension: Secondary | ICD-10-CM | POA: Diagnosis not present

## 2017-04-12 DIAGNOSIS — I110 Hypertensive heart disease with heart failure: Secondary | ICD-10-CM | POA: Diagnosis not present

## 2017-04-12 DIAGNOSIS — C259 Malignant neoplasm of pancreas, unspecified: Secondary | ICD-10-CM | POA: Diagnosis not present

## 2017-04-12 DIAGNOSIS — E781 Pure hyperglyceridemia: Secondary | ICD-10-CM | POA: Insufficient documentation

## 2017-04-12 DIAGNOSIS — R079 Chest pain, unspecified: Secondary | ICD-10-CM | POA: Insufficient documentation

## 2017-04-12 DIAGNOSIS — L03119 Cellulitis of unspecified part of limb: Secondary | ICD-10-CM | POA: Diagnosis not present

## 2017-04-12 DIAGNOSIS — G2581 Restless legs syndrome: Secondary | ICD-10-CM | POA: Diagnosis not present

## 2017-04-12 DIAGNOSIS — E662 Morbid (severe) obesity with alveolar hypoventilation: Secondary | ICD-10-CM | POA: Insufficient documentation

## 2017-04-12 DIAGNOSIS — I5032 Chronic diastolic (congestive) heart failure: Secondary | ICD-10-CM | POA: Insufficient documentation

## 2017-04-12 DIAGNOSIS — Z79899 Other long term (current) drug therapy: Secondary | ICD-10-CM | POA: Insufficient documentation

## 2017-04-12 DIAGNOSIS — Z7982 Long term (current) use of aspirin: Secondary | ICD-10-CM | POA: Insufficient documentation

## 2017-04-12 LAB — BASIC METABOLIC PANEL
Anion gap: 10 (ref 5–15)
BUN: 24 mg/dL — AB (ref 6–20)
CHLORIDE: 102 mmol/L (ref 101–111)
CO2: 25 mmol/L (ref 22–32)
Calcium: 8.9 mg/dL (ref 8.9–10.3)
Creatinine, Ser: 1.11 mg/dL — ABNORMAL HIGH (ref 0.44–1.00)
GFR calc Af Amer: 57 mL/min — ABNORMAL LOW (ref >60)
GFR calc non Af Amer: 49 mL/min — ABNORMAL LOW (ref >60)
GLUCOSE: 194 mg/dL — AB (ref 65–99)
POTASSIUM: 4 mmol/L (ref 3.5–5.1)
Sodium: 137 mmol/L (ref 135–145)

## 2017-04-12 MED ORDER — TORSEMIDE 100 MG PO TABS: 50.0000 mg | ORAL_TABLET | Freq: Every day | ORAL | 3 refills | Status: DC

## 2017-04-12 NOTE — Patient Instructions (Signed)
Take Torsemide 100 mg (1 whole tablet) once today and tomorrow. Then reduce back to normal daily dose of 50 mg (1/2 tablet) once daily. Take 100 mg (1 whole tablet) on chemo days and as needed for swelling/weight gain.  Routine lab work today. Will notify you of abnormal results, otherwise no news is good news!  Follow up 4 weeks with Oda Kilts PA-C.  _________________________________________________________ Melissa Myers Code: 9002  Follow up 3 months with Dr. Aundra Dubin.  __________________________________________________________ Melissa Myers Code: 1287  Take all medication as prescribed the day of your appointment. Bring all medications with you to your appointment.  Do the following things EVERYDAY: 1) Weigh yourself in the morning before breakfast. Write it down and keep it in a log. 2) Take your medicines as prescribed 3) Eat low salt foods-Limit salt (sodium) to 2000 mg per day.  4) Stay as active as you can everyday 5) Limit all fluids for the day to less than 2 liters

## 2017-04-12 NOTE — Progress Notes (Signed)
Patient ID: Melissa Myers, female   DOB: Jul 10, 1945, 72 y.o.   MRN: [id number]   PCP: Dr. Sharlet Salina Endocrinologist: Dr Loanne Drilling Cardiology: Dr Aundra Dubin.   Melissa Myers is a 72 y.o. female with history of HTN, DM, hyperlipidemia, OHS/OSA, and chronic dyspnea/diastolic CHF returns for followup of CHF.  Patient had an echo in 04/2009 showing moderate LVH and preserved LV systolic function. However, there was a very large LV mid-cavity gradient with valsalva.  Patient developed quite significant exertional dyspnea to the point where she was short of breath walking around her house. She had a pulmonary evaluation with Dr. Elsworth Soho but no primary lung problems were identified. Patient had an ETT-myoview in 05/2009 which was negative for ischemia or infarction. Right heart cath in 05/2009 showed mildly elevated right heart filling pressures but normal PA pressure and normal PCWP. She was started her on a beta blocker (Coreg) to try to lower her LV mid-cavity gradient (that likely occurs with exertion) and to better control BP.  V/Q scan was negative for PE.  PFTs from 06/2011 showed a restrictive defect. Last echo in 11/2013 showed EF 65% with normal RV size and systolic function.      Admitted 12/05/13 with volume overload. Diuresed with IV lasix and transitioned to torsemide 80 mg twice a day + metolazone. Overall she diuresed 21 pounds. Discharge weight was 263 pounds.   She was admitted in 9/16 with Strep agalactiae sepsis likely from cellulitis.    Admitted 12/1 though 01/26/15 with marked volume overload and cellulitis. Diuresed with Lasix drip and placed on antibiotics. Overall diuresed 20 pounds.   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  Most of leg pain is likely due to neuropathy.    She was diagnosed with pancreatic cancer in 2018.  Being followed at [facility] currently, definite decision +/- surgery has not yet been made.  Poor  functional status.  Memory is worse.   She presents for add on for edema. Started back on torsemide recently. Weight up 24 lbs since visit in October.  She is now on single agent Gemcitabine every 2/3 weeks on and 1 week off. She has been feeling overall better. Currently being treated for RLE cellulitis with clindamycin.  Her appetite has greatly improved ( was up at 4 am "raiding the refrigerator"). She has been taking torsemide 50 mg daily. Does not take extra on chemo days. Has increased her fluid intake.   Labs (10/13): K 4.1, creatinine 0.85 Labs (4/14): K 3.7, creatinine 1.2, BUN 43, BNP 23 Labs (5/14): K 4.1, creatinine 1.0 Labs (7/14): K 4.6 Creatinine 1.0 BNP 39.0  Labs (8/14): K 3.5, creatinine 0.91, BNP 71 Labs (11/14): K 3.7, creatinine 0.9 Labs (12/14): K 4.1, creatinine 0.8, BNP 52 Labs (3/15): K 4, creatinine 0.9 Labs (4/15): K 3.7, creatinine 0.9 Labs (5/15): K 3.8, creatinine 0.9, BNP 29 Labs (6/15): K 3.4, BUN 105, creatinine 0.9 =>1.7 Labs (6/17/5) K 4.2, BUN 41, Creatinine 0.80 Labs (08/19/13) K 3.2, BUN 71, creatinine 1.5 Labs 09/05/13 K 4.1 Creatinine 1.1 Labs 11/07/13 K 3.6 Creatinine 0.91 Labs 12/11/13 K 4.5 Creatine 1.05 Labs (12/15): K 3.8, creatinine 1.03 Labs (03/17/2014): K 4.1 Creatinine 0.99  Labs (4/16): K 3.7, creatinine 1.24 Labs (5/16): LDL 108, TGs 699 Labs (07/24/2014) K 3.5 Creatinine 1.17  Labs (07/31/2014) K 3.3 Creatinine 1.13 Labs (9/16): K 3.5, creatinine 0.93 Labs (01/26/2015) K 3.6 Creatinine 1.36  Labs 04/22/15 K 3.8,  Creatinine 1.66 Labs (4/17): K 4.6, creatinine 1.74, HCT 37.9 Labs (9/17): K 2.8, creatinine 1.67, glucose 505, BNP 20, hgb 12.4 Labs (11/19/2015) : K 3.2 Creatinine 2.25  Labs (11/27/2015): K 4.4 Creatinine 1.26  Labs (11/17): BNP 63 Labs (12/17): K 3.9, creatinine 1.49, hgbA1c 14.1 Labs (9/18): K 2.9, creatinine 1.08, hgb 8  Allergies (verified):  1) ! Sulfa  2) Morphine   Past Medical History:  1. Diabetes mellitus, type II    2. Hyperlipidemia: She has been unable to tolerate statins (has been on Vytorin, lovastatin, and Crestor) due to what sounds like rhabdo: developed muscle weakness and was told she had "muscle damage" with each of these statins. She has been told not to take statins anymore.  Has hypertriglyceridemia.  3. Hypertension 4. Obesity  5. Restless Leg Syndrome  6. OA  7. Diabetic peripheral neuropathy  8. OHS/OSA: Uses supplemental oxygen, does not tolerate CPAP.   9. Chronic nausea/diarrhea: ? IBS  10. Diastolic CHF: Echo (9/32) with normal LV size, moderate LVH, EF 65-70%, LV mid-cavity gradient reaching 63 mmHg with valsalva but minimal at rest, grade I diastolic dysfunction, cannot estimate PA systolic pressure (no TR doppler signal), RV normal. RHC (4/11): Mean RA 11, RV 37/13, PA 33/15, mean PCWP 12, CI 3.3. Echo in 11/12 showed mild LVH, EF 65-70%, no LV mid-cavity gradient was mentioned.  Echo in 4/14 showed EF 65-70%, mild LVH, grade I diastolic dysfunction, PA systolic pressure 35 mmHg, mild LV mid-cavity gradient.  Echo (5/15) with EF 60-65%, mild TR, normal RV size and systolic function. Echo (10/15) with EF 65%, moderate LVH, normal RV.  - ECHO 11/30/15 LVEF 65-70%, Grade 1 DD.  11. ETT-myoview (4/11): 5'30", stopped due to fatigue, normal EF, no evidence for scar or ischemia. 12. V/Q scan 11/12: negative for PE.  Lower extremity venous doppler US (3/14): negative for DVT.  13. PFTs (5/13): FVC 50%, FEV1 62%, ratio 106%, DLCO 69%, TLC 69%.  14. Pancreatic adenocarcinoma: Found in 2018.   Family History:  (Reviewed, No new) Family History of Alcoholism/Addiction (parent)  Family History Diabetes 1st degree relative (grandparent)  Family History High cholesterol (parent, grandparent)  Family History Hypertension (parent, grandparent)  Family History Lung cancer (grandparent)  Stomach cancer (grandmother)  Celiac Sprue daughter  Mother with MI at 66, father with MI at 17, uncle with MI at  32, brother with stents in his 13s, multiple aunts with MIs and CVAs   Social History: (Reviewed, No new) Never Smoked  no alcohol  married, lives with spouse and her mother  retired Network engineer Alcohol Use - no  Illicit Drug Use - no   Review of systems complete and found to be negative unless listed in HPI.    Current Outpatient Medications  Medication Sig Dispense Refill  . aspirin EC 81 MG tablet Take 1 tablet (81 mg total) by mouth daily.    . carvedilol (COREG) 25 MG tablet TAKE ONE TABLET TWICE DAILY WITH FOOD 60 tablet 5  . Cholecalciferol (VITAMIN D3) 2000 units capsule Take 1 capsule (2,000 Units total) by mouth daily. 90 capsule 3  . clindamycin (CLEOCIN) 300 MG capsule Take 1 capsule (300 mg total) by mouth 3 (three) times daily. 21 capsule 0  . Continuous Blood Gluc Sensor (FREESTYLE LIBRE SENSOR SYSTEM) MISC Use to check sugars 1 each 0  . diphenoxylate-atropine (LOMOTIL) 2.5-0.025 MG tablet Take 1-2 tablets by mouth 4 (four) times daily as needed for diarrhea or loose stools. 120 tablet 2  .  gabapentin (NEURONTIN) 300 MG capsule TAKE TWO CAPSULES FOUR TIMES DAILY 240 capsule 1  . hydrALAZINE (APRESOLINE) 25 MG tablet TAKE 1 TABLET (25 MG TOTAL) BY MOUTH 3 (THREE) TIMES DAILY. 90 tablet 3  . insulin aspart (NOVOLOG) 100 UNIT/ML injection Inject 0-20 Units into the skin 3 (three) times daily with meals. . CBG 70 - 120: 0 units CBG 121 - 150: 3 units CBG 151 - 200: 4 units CBG 201 - 250: 7 units CBG 251 - 300: 11 units CBG 301 - 350: 15 units CBG 351 - 400: 20 units    . insulin glargine (LANTUS) 100 UNIT/ML injection Inject 0.3 mLs (30 Units total) into the skin daily.    [name] lactulose (CHRONULAC) 10 GM/15ML solution Take 10 g by mouth daily as needed for mild constipation.    . lidocaine (LIDODERM) 5 % Place 1 patch daily onto the skin. Remove & Discard patch within 12 hours or as directed by MD 30 patch 0  . lidocaine-prilocaine (EMLA) cream Apply to port site prior to  being accessed    . lipase/protease/amylase (CREON) 36000 UNITS CPEP capsule 2 po prior to snacks; 4 po prior to meals    . meclizine (ANTIVERT) 25 MG tablet Take 25 mg 2 (two) times daily as needed by mouth for dizziness.     . metFORMIN (GLUCOPHAGE) 500 MG tablet Take 500 mg by mouth daily with breakfast.    . mirtazapine (REMERON) 30 MG tablet Take 1 tablet (30 mg total) by mouth at bedtime. 30 tablet 2  . mupirocin ointment (BACTROBAN) 2 % Apply 1 application topically daily. 90 g 0  . ondansetron (ZOFRAN) 8 MG tablet Take 1 tablet (8 mg total) by mouth every 8 (eight) hours as needed for nausea or vomiting. 30 tablet 2  . ONETOUCH VERIO test strip USE AS INSTRUCTED UP TO 4 TIMES DAILY 200 each 11  . oxyCODONE (OXY IR/ROXICODONE) 5 MG immediate release tablet Take 1 tablet (5 mg total) by mouth 2 (two) times daily as needed for severe pain. 60 tablet 0  . pregabalin (LYRICA) 75 MG capsule Take 1 capsule (75 mg total) daily by mouth. 30 capsule 3  . rOPINIRole (REQUIP XL) 4 MG 24 hr tablet TAKE ONE TABLET AT BEDTIME 30 tablet 5  . rOPINIRole (REQUIP) 2 MG tablet Take 1 tablet (2 mg total) by mouth 3 (three) times daily. 90 tablet 2  . sertraline (ZOLOFT) 50 MG tablet Take 50 mg by mouth daily.    . silver sulfADIAZINE (SILVADENE) 1 % cream Apply 1 application daily topically. 50 g 0  . spironolactone (ALDACTONE) 25 MG tablet Take 1 tablet (25 mg total) by mouth daily. 90 tablet 3  . traMADol (ULTRAM) 50 MG tablet Take 1 tablet (50 mg total) by mouth every 6 (six) hours as needed. (Patient taking differently: Take 50 mg every 6 (six) hours as needed by mouth for moderate pain. ) 60 tablet 0   No current facility-administered medications for this encounter.    Facility-Administered Medications Ordered in Other Encounters  Medication Dose Route Frequency Provider Last Rate Last Dose  . ondansetron (ZOFRAN) injection 8 mg  8 mg Intravenous Once Cira Rue K, NP      . sodium chloride flush  (NS) 0.9 % injection 10 mL  10 mL Intracatheter PRN Truitt Merle, MD   10 mL at 02/22/17 1729   Vitals:   04/12/17 1016  BP: (!) 118/54  Pulse: 66  SpO2: 95%  Weight:  214 lb 12.8 oz (97.4 kg)   Wt Readings from Last 3 Encounters:  04/12/17 214 lb 12.8 oz (97.4 kg)  04/05/17 205 lb 3.2 oz (93.1 kg)  03/28/17 211 lb 1.6 oz (95.8 kg)    General: Well appearing. No resp difficulty. HEENT: Normal Neck: Supple. JVP 6-7. Carotids 2+ bilat; no bruits. No thyromegaly or nodule noted. Cor: PMI nondisplaced. RRR, No M/G/R noted Lungs: CTAB, normal effort. Abdomen: Soft, non-tender, non-distended, no HSM. No bruits or masses. +BS  Extremities: No cyanosis, clubbing, or rash. LLE with chronic venous stasis and trace edema. RLE with 1-2+ edema 3/4 to knee with erythema and marked tenderness.  Neuro: Alert & orientedx3, cranial nerves grossly intact. moves all 4 extremities w/o difficulty. Affect pleasant   Assessment/Plan: 1. Chronic diastolic CHF:   - NYHA III, limited by weakness but not dyspnea.  - Volume status mildly elevated on exam - Increase torsemide to 100 mg daily x 2 days, then back to 50 mg daily. Repeat as needed. Should take 100 mg on chemo days with IV fluids.  - Reinforced fluid restriction to < 2 L daily, sodium restriction to less than 2000 mg daily, and the importance of daily weights.    2. OHS:  Followed by Dr Elsworth Soho, has previously stated no role for CPAP.  - No change.   3. HTN:  - Stable.     4. PAD: Bilateral iliac disease on aortoiliac dopplers in 7/17.   - Plan for medical management per Dr. Fletcher Anon - Continue Zetia, cannot take statins.   5. Murmur: Has had vigorous LV contraction with LV mid-cavity gradient in the past. - Continue Coreg.  6. Pancreatic cancer:  - Follows with [facility] - No plans for surgery, inoperable tumor.  - She is now on single agent Gemcitabine every 2/3 weeks on and 1 week off. Pt is aware of her prognosis.   7. Chest pain:  - No  further. Was atypical. CT negative for PE.   8. RLE Cellulitis - Per primary. On Clindamycin.   Increase torsemide as above and reviewed sliding scale diuretics at length. BMET today. RTC 4 weeks. 3 months with MD.  Aliviah Friar, PA-C  04/12/2017   Greater than 50% of the 25 minute visit was spent in counseling/coordination of care regarding disease state education, salt/fluid restriction, sliding scale diuretics, and medication compliance.

## 2017-04-16 IMAGING — DX DG CHEST 1V PORT
1 series · 1 of 1 positions shown · non-contrast
Comparison: 03/28/2016

CLINICAL DATA: Confusion, hyperglycemia, and headache tonight

EXAM:
PORTABLE CHEST 1 VIEW

[chest ap]
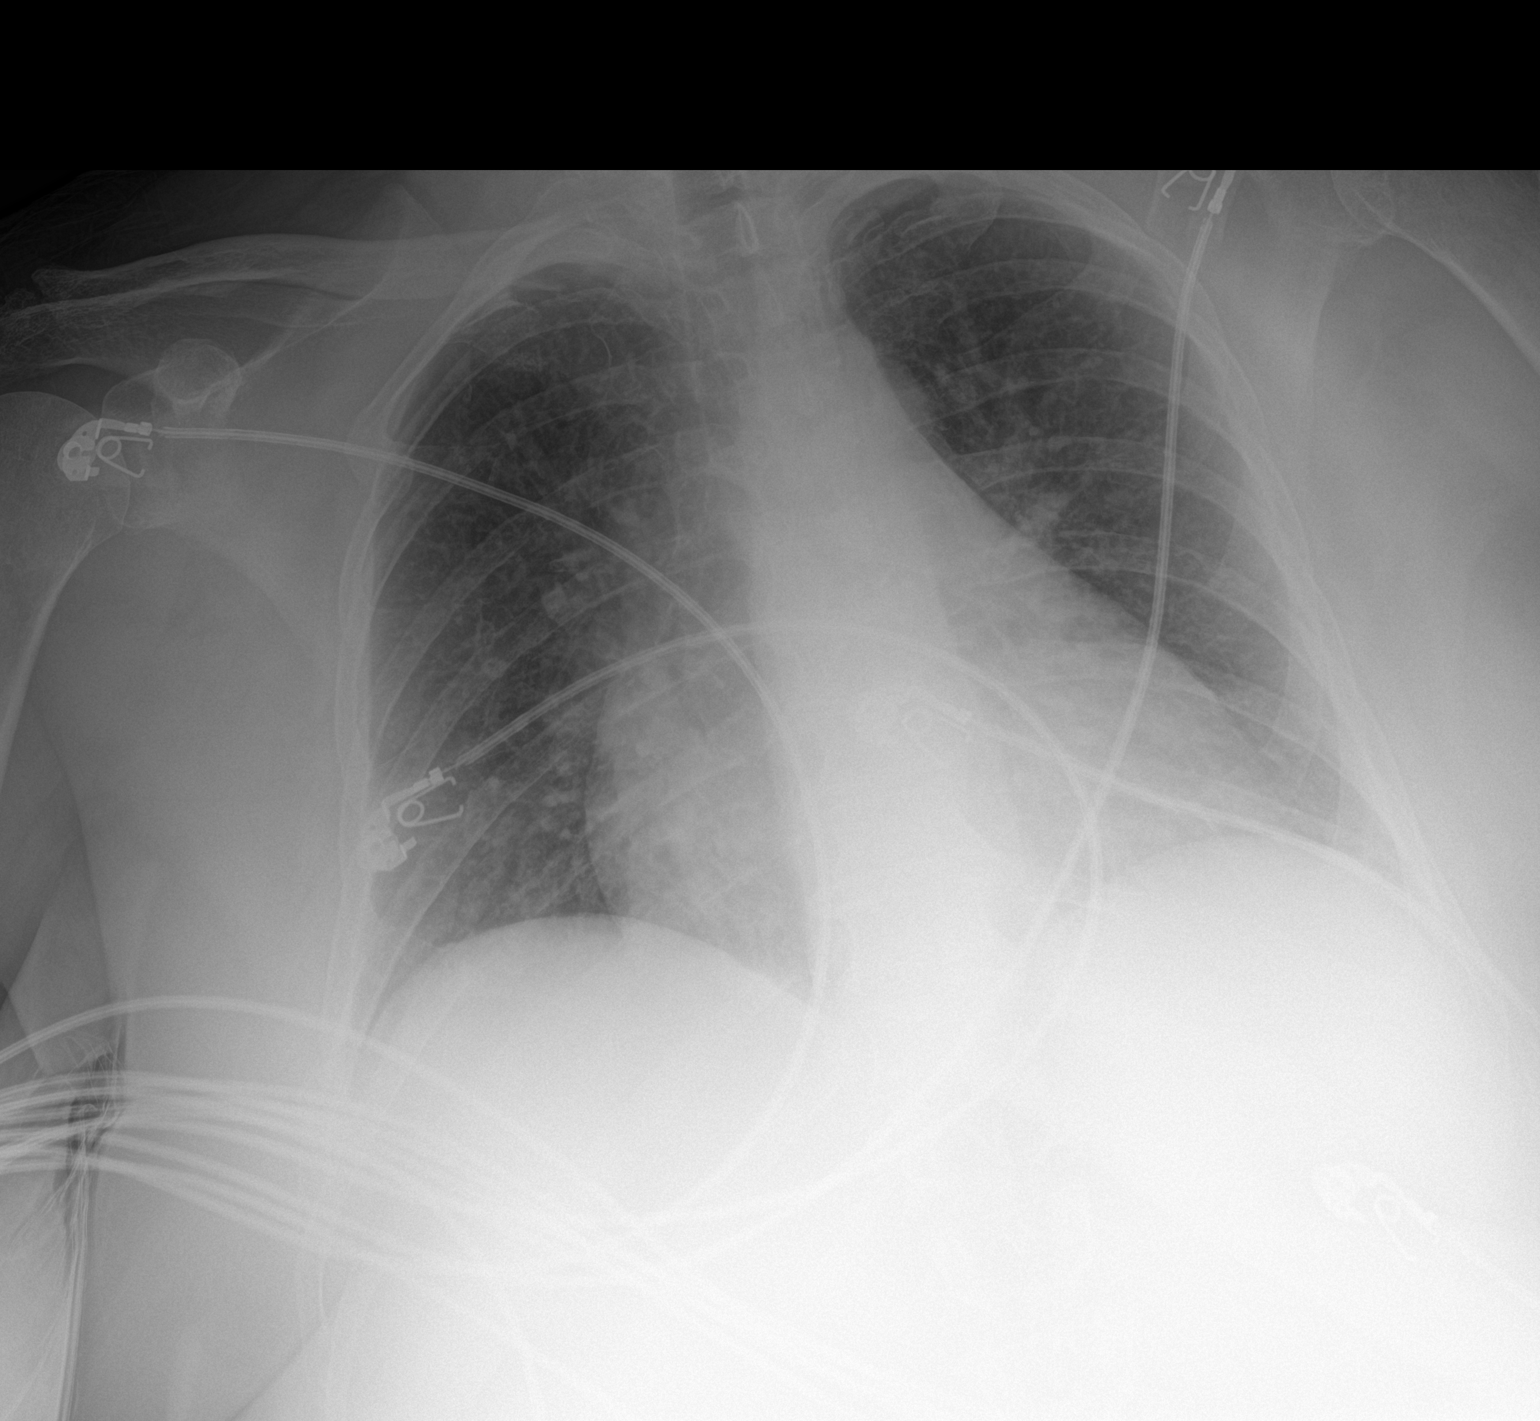

[1 of 1 positions shown; findings below may reference images not displayed]

FINDINGS: Shallow inspiration. The heart size and mediastinal contours are
within normal limits. Both lungs are clear. The visualized skeletal
structures are unremarkable.
IMPRESSION: No active disease.

## 2017-04-17 ENCOUNTER — Other Ambulatory Visit (HOSPITAL_COMMUNITY): Payer: Self-pay | Admitting: Cardiology

## 2017-04-17 IMAGING — CT CT HEAD W/O CM
3 of 4 series · 18 of 47 positions shown, 21 images · non-contrast
Comparison: Head CT dated 10/22/2014

CLINICAL DATA: 70-year-old female with headache and confusion.

EXAM:
CT HEAD WITHOUT CONTRAST
TECHNIQUE: Contiguous axial images were obtained from the base of the skull
through the vertex without intravenous contrast.

[Series 201: head w/o, idose (1) · axial · non-contrast · 0.41mm/px · z∈[+227,+352]mm · 12 of 31 slices shown, 15 images]
[im 3/31  brain]
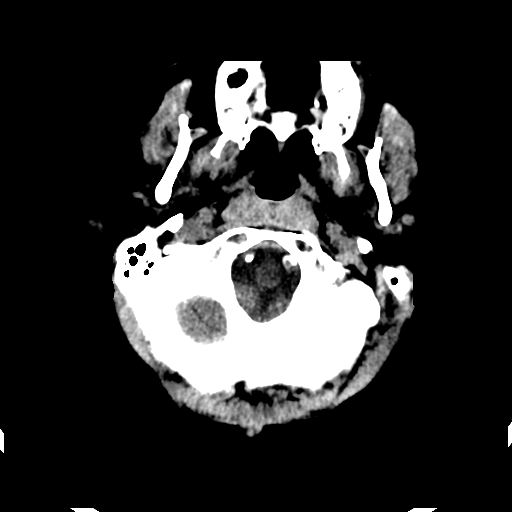
[im 3/31  bone]
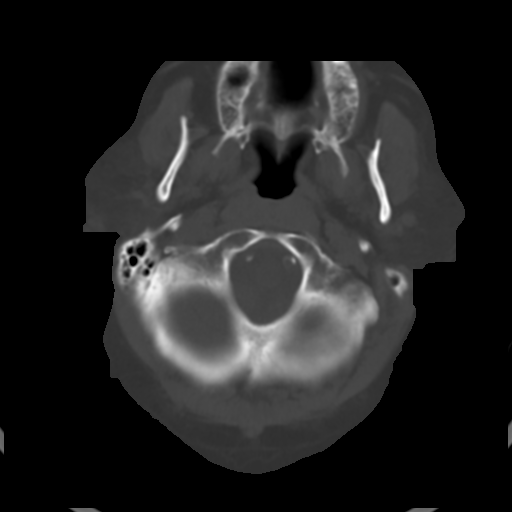
[im 5/31  brain]
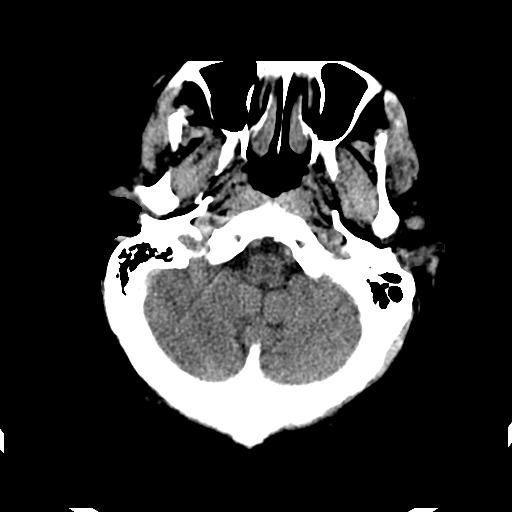
[im 7/31  brain]
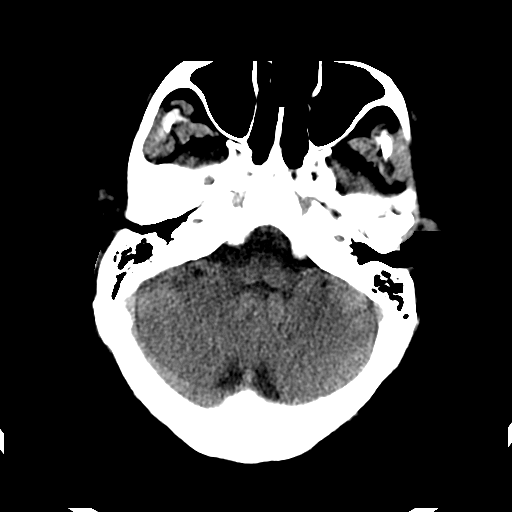
[im 9/31  brain]
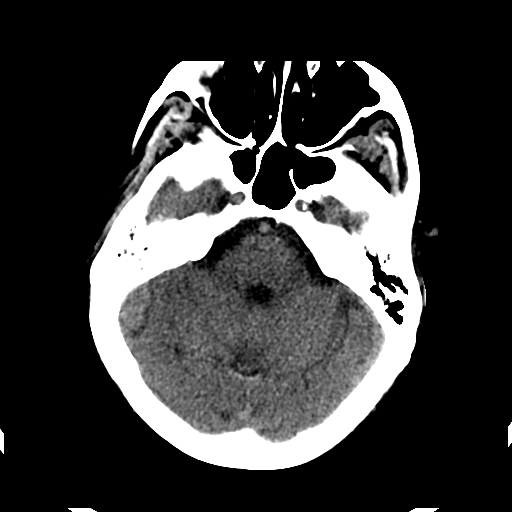
[im 11/31  brain]
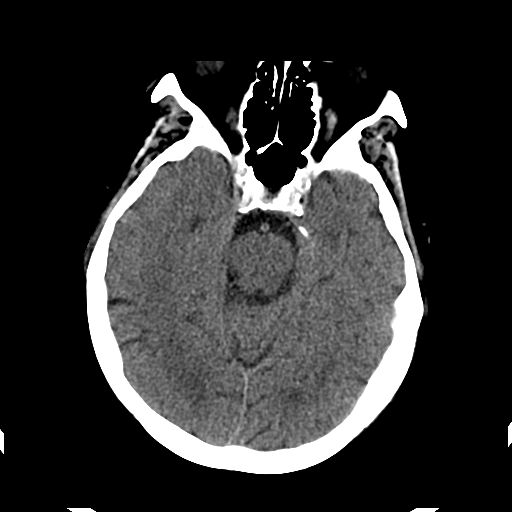
[im 11/31  bone]
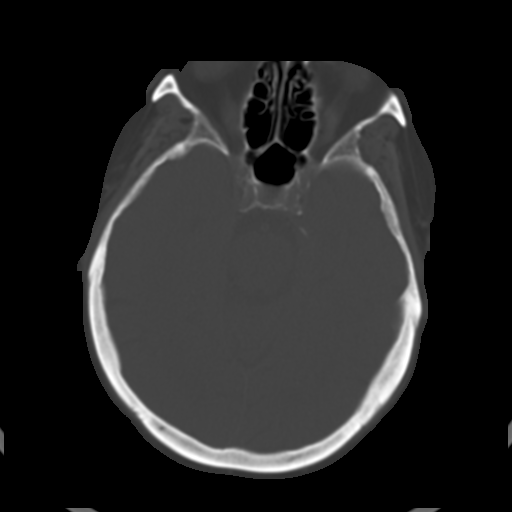
[im 13/31  brain]
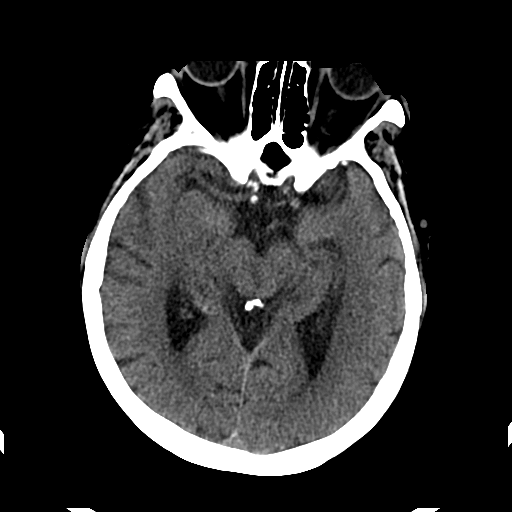
[im 18/31  brain]
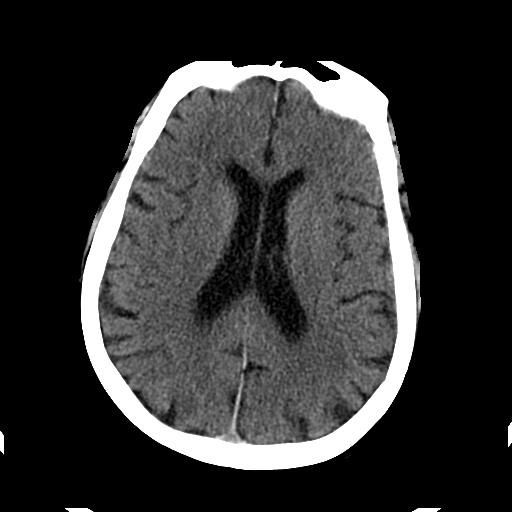
[im 20/31  brain]
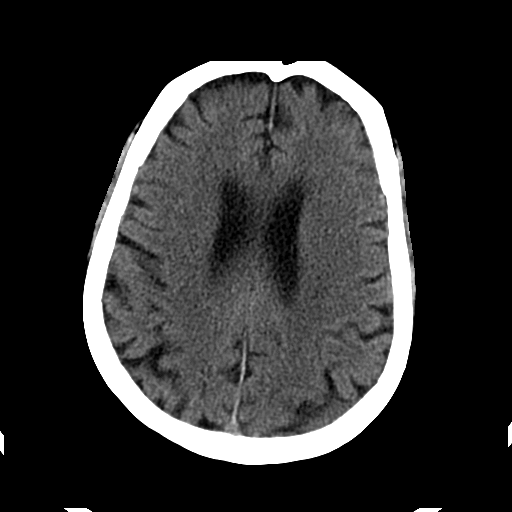
[im 22/31  brain]
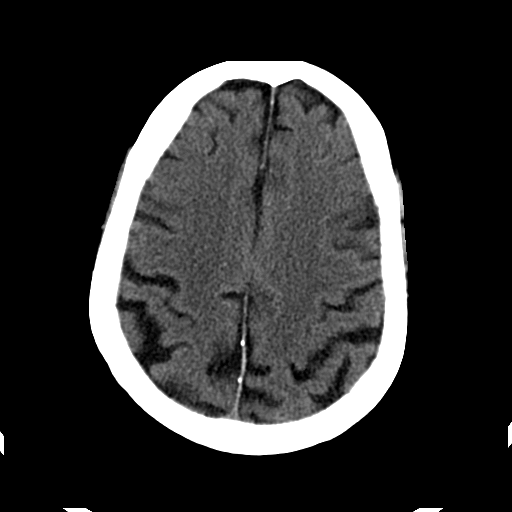
[im 22/31  bone]
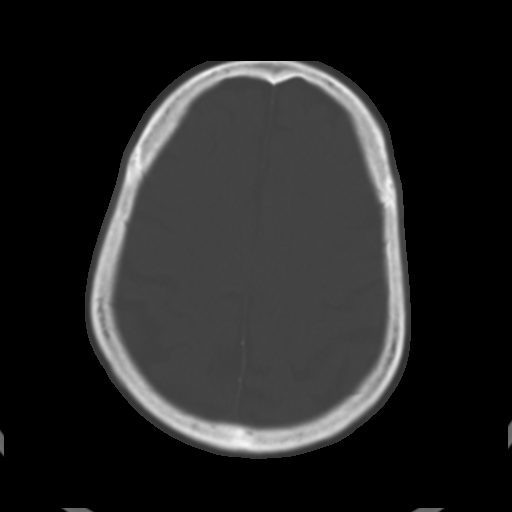
[im 24/31  brain]
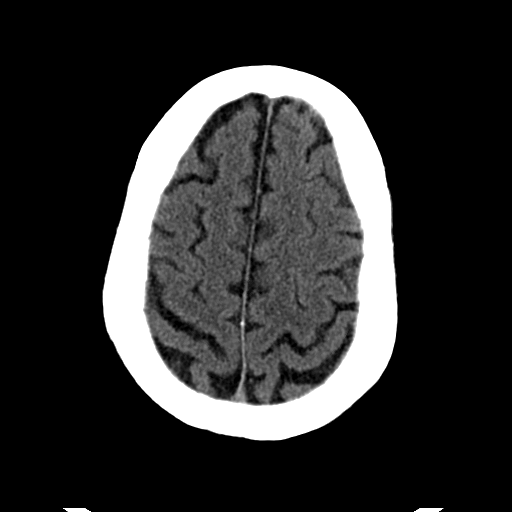
[im 26/31  brain]
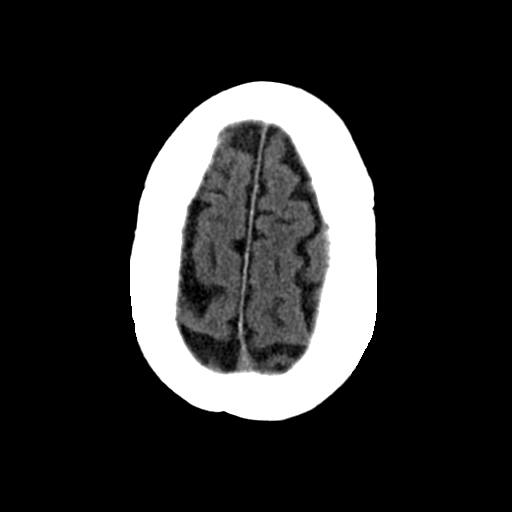
[im 28/31  brain]
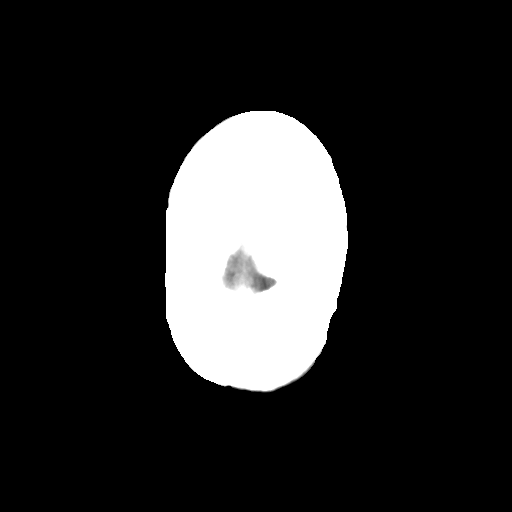

[Series 203: coronal st, idose (1) · coronal · 0.40mm/px · 3 of 61 slices shown]
[im 21/61  brain]
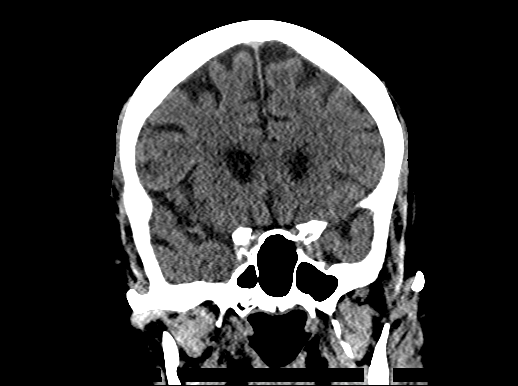
[im 27/61  brain]
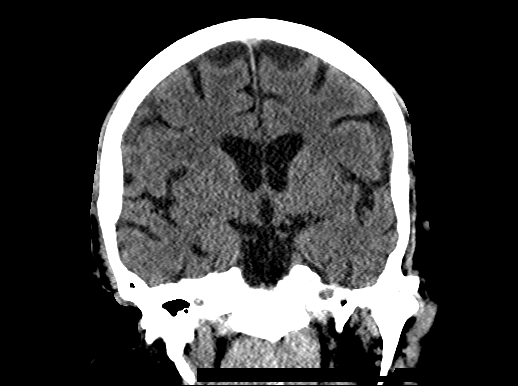
[im 34/61  brain]
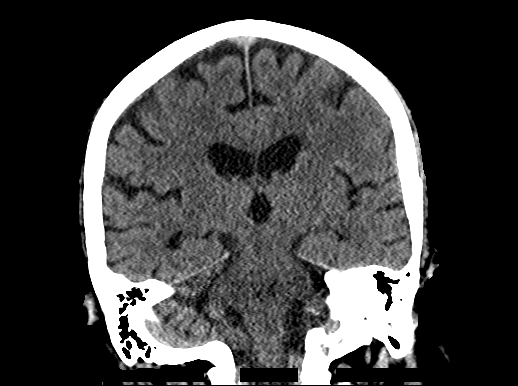

[Series 204: sagittal st, idose (1) · sagittal · 0.40mm/px · 3 of 45 slices shown]
[im 15/45  brain]
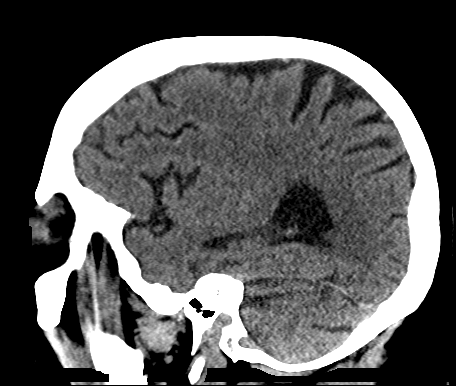
[im 23/45  brain]
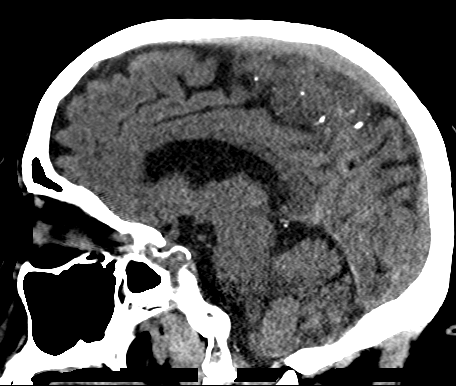
[im 30/45  brain]
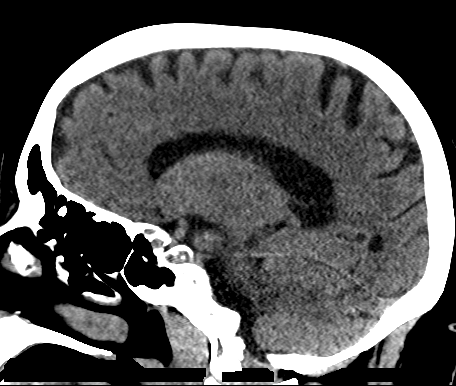

[18 of 47 positions shown; findings below may reference images not displayed]

FINDINGS: Brain: The ventricles and sulci appropriate in size for patient's
age. Minimal periventricular and deep white matter chronic
microvascular ischemic changes noted. There is no acute intracranial
hemorrhage. No mass effect or midline shift noted. There is no
extra-axial fluid collection.

Vascular: No hyperdense vessel or unexpected calcification.

Skull: Normal. Negative for fracture or focal lesion.

Sinuses/Orbits: No acute finding.

Other: None.
IMPRESSION: No acute intracranial pathology.

## 2017-04-17 IMAGING — CT CT ABD-PELV W/ CM
2 of 5 series · 16 of 46 positions shown, 18 images · IV contrast (Omni 300)
Comparison: ERCP 03/18/2016.  MRI 03/16/2016.  CT 03/15/2016.

CLINICAL DATA: Epigastric pain. Hx diabetes, GERD, lumbar disc
degeneration, chronic diarrhea, uterine polyp removal, umbilical
hernia, tubal ligation, and cholecystectomy.

EXAM:
CT ABDOMEN AND PELVIS WITH CONTRAST
TECHNIQUE: Multidetector CT imaging of the abdomen and pelvis was performed
using the standard protocol following bolus administration of
intravenous contrast.
CONTRAST:  100mL [id number] IOPAMIDOL ([id number]-III) INJECTION 61%

[Series 3: a/p w/ 5mm · axial · 0.88mm/px · z∈[-438,-58]mm · 13 of 90 slices shown, 15 images]
[im 7/90  soft-tissue]
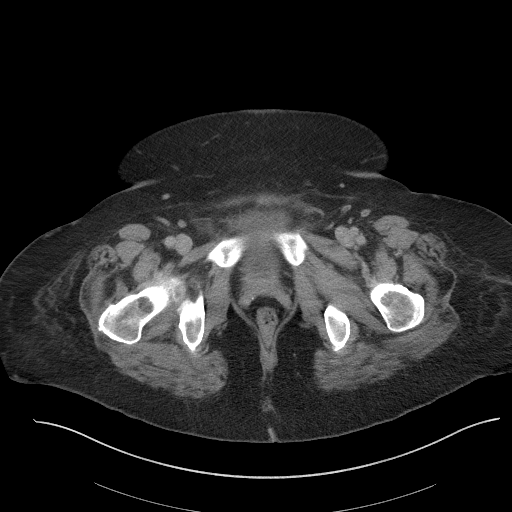
[im 7/90  bone]
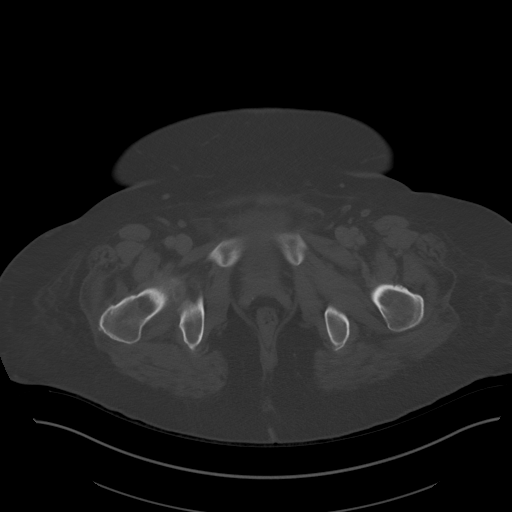
[im 13/90  soft-tissue]
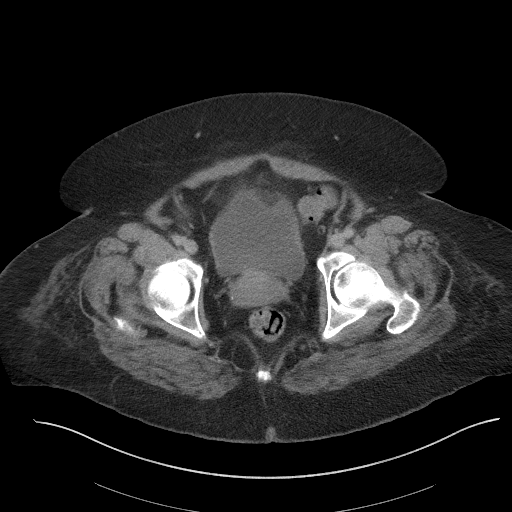
[im 20/90  soft-tissue]
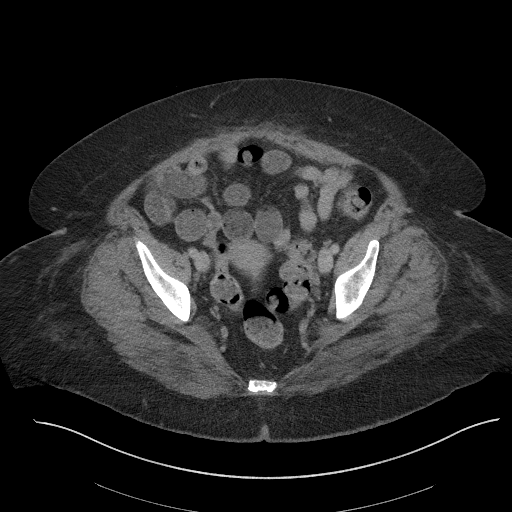
[im 26/90  soft-tissue]
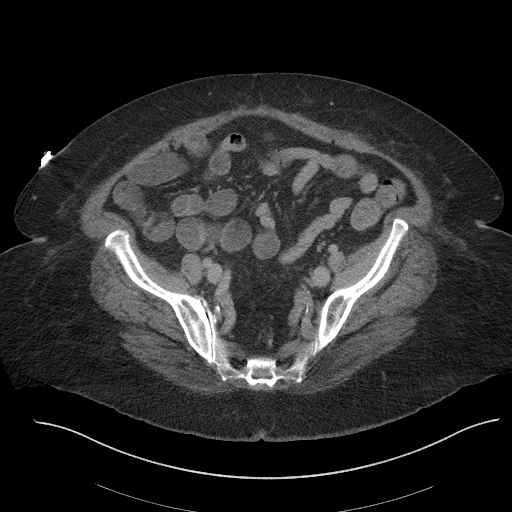
[im 32/90  soft-tissue]
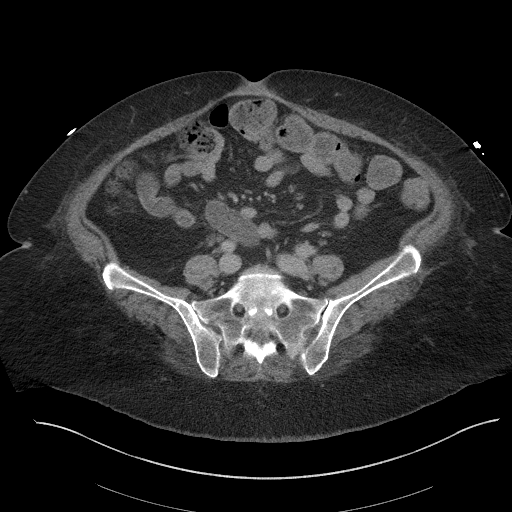
[im 39/90  soft-tissue]
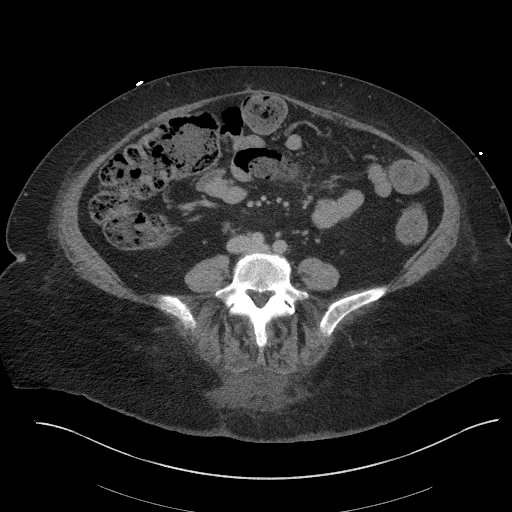
[im 45/90  soft-tissue]
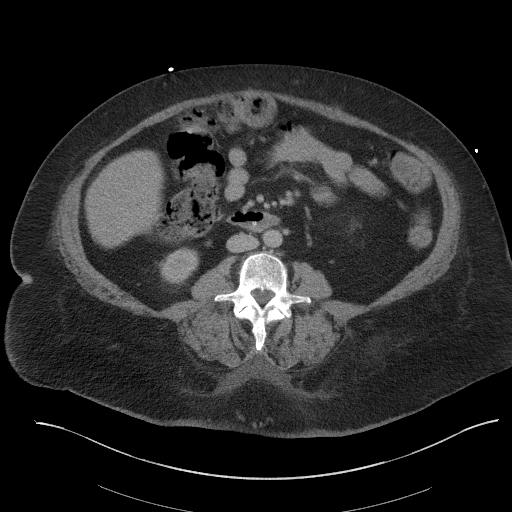
[im 51/90  soft-tissue]
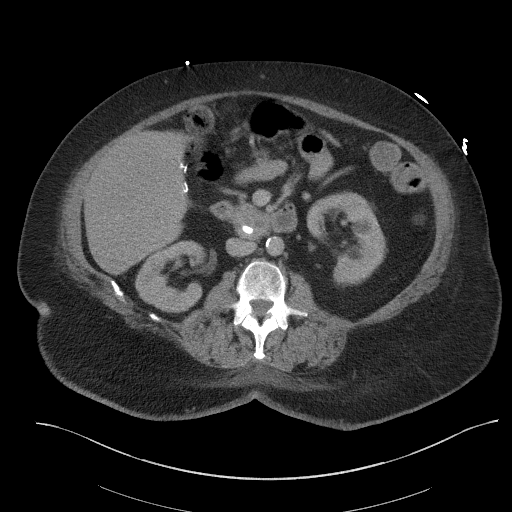
[im 58/90  soft-tissue]
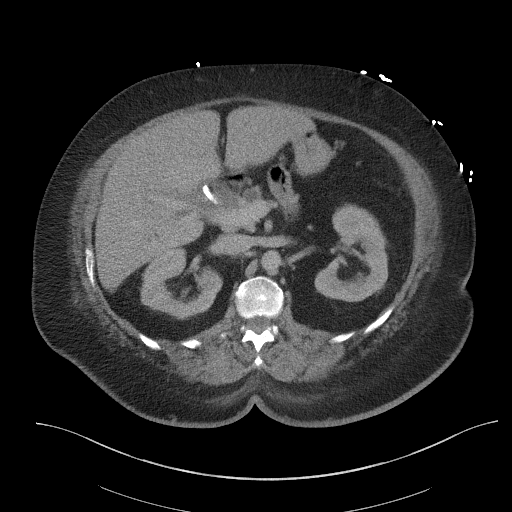
[im 58/90  bone]
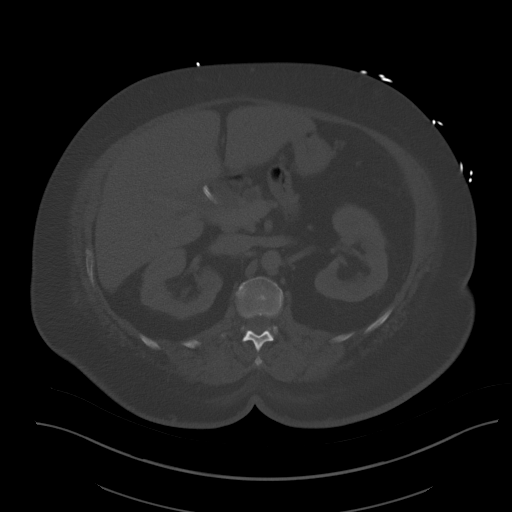
[im 64/90  soft-tissue]
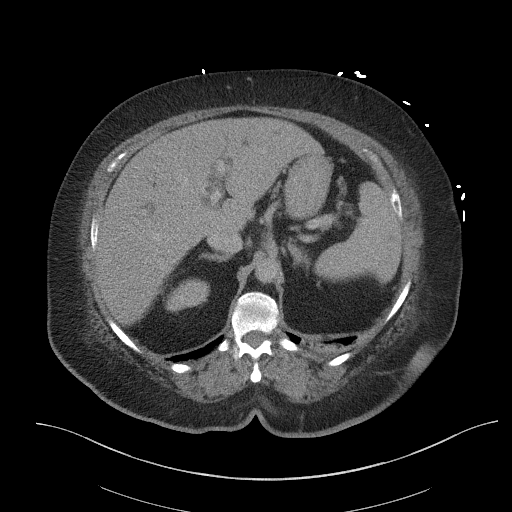
[im 70/90  soft-tissue]
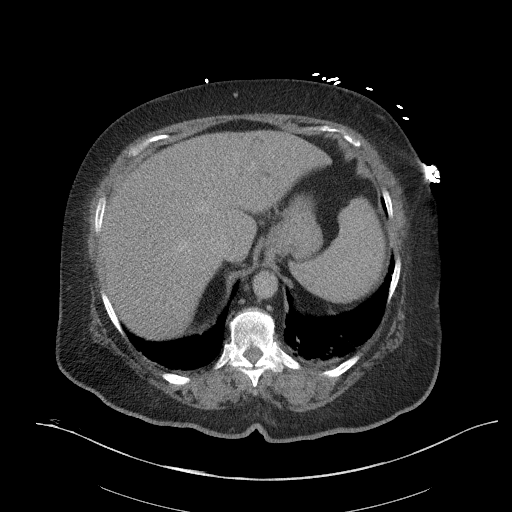
[im 77/90  soft-tissue]
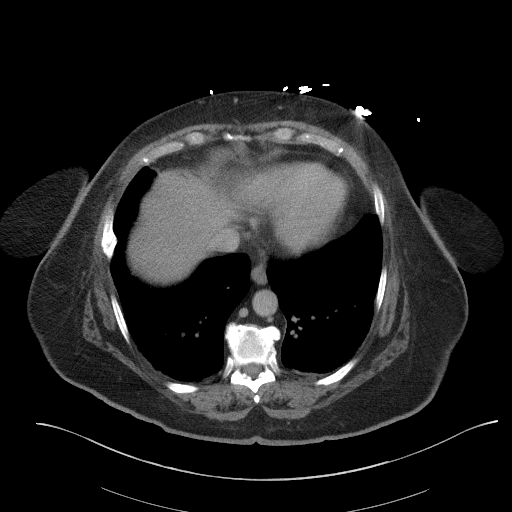
[im 83/90  soft-tissue]
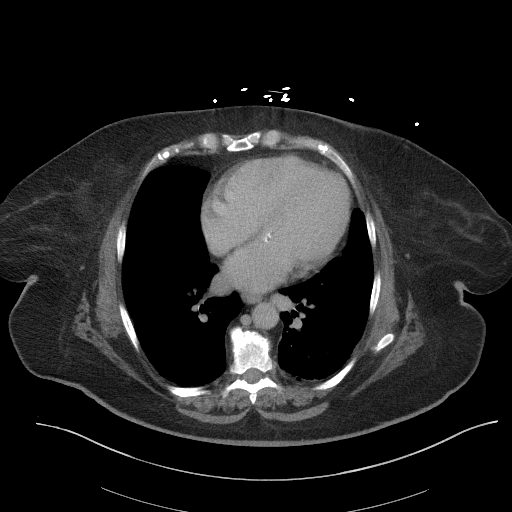

[Series 6: a/p w/ cor · coronal · 0.86mm/px · 3 of 160 slices shown]
[im 54/160  soft-tissue]
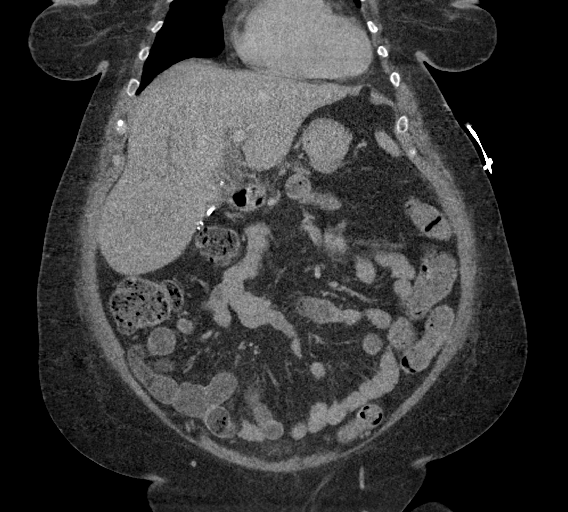
[im 71/160  soft-tissue]
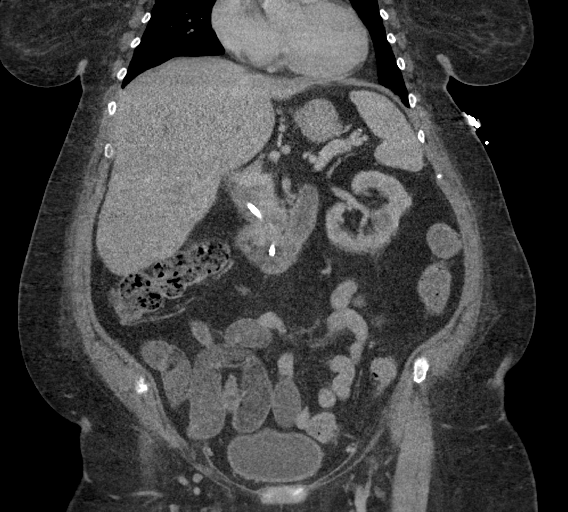
[im 89/160  soft-tissue]
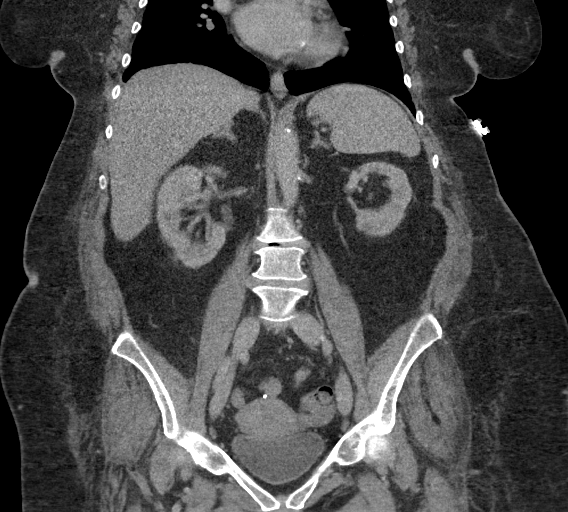

[16 of 46 positions shown; findings below may reference images not displayed]

FINDINGS: Lower chest: Scattered emphysematous changes in the lung bases.
Fibrosis or atelectasis in the lung bases. Calcification in the
mitral valve annulus and coronary arteries.

Hepatobiliary: Since the previous study, there is interval placement
of a bile duct stent. Mild residual bile duct dilatation is less
prominent than on prior study. Surgical absence of the gallbladder.
No focal liver lesions.

Pancreas: Diffuse pancreatic ductal dilatation with pancreatic
parenchymal atrophy. Mass lesion demonstrated in the head of the
pancreas on previous MRI is not well visualized at CT. No
inflammatory stranding.

Spleen: Focal lesion in the spleen measuring about 6 mm diameter is
unchanged since previous study. This probably represents a small
cyst. Additional subcentimeter lesion in the upper spleen is also
unchanged.

Adrenals/Urinary Tract: Adrenal glands are unremarkable. Kidneys are
normal, without renal calculi, focal lesion, or hydronephrosis.
Bladder is unremarkable.

Stomach/Bowel: Stomach and small bowel are mostly decompressed. No
small bowel distention or inflammatory infiltration. Colon is
diffusely stool-filled without abnormal distention or inflammation.
Scattered colonic diverticula. Appendix is not identified.

Vascular/Lymphatic: Aortic atherosclerosis. No enlarged abdominal or
pelvic lymph nodes.

Reproductive: Uterus and bilateral adnexa are unremarkable.

Other: Small left inguinal hernia containing fat. No free air or
free fluid in the abdomen.

Musculoskeletal: Degenerative changes in the spine. No destructive
bone lesions.
IMPRESSION: Interval placement of a bile duct stent with some decompression of
the bile ducts. Mild residual biliary dilatation. Pancreatic ductal
dilatation and atrophy. Known pancreatic mass lesion is not well
depicted at CT. No evidence of bowel obstruction or inflammation.

## 2017-04-18 ENCOUNTER — Ambulatory Visit: Payer: PPO

## 2017-04-18 ENCOUNTER — Other Ambulatory Visit: Payer: PPO

## 2017-04-18 ENCOUNTER — Ambulatory Visit: Payer: PPO | Admitting: Nurse Practitioner

## 2017-04-19 ENCOUNTER — Inpatient Hospital Stay (HOSPITAL_BASED_OUTPATIENT_CLINIC_OR_DEPARTMENT_OTHER): Payer: PPO | Admitting: Nurse Practitioner

## 2017-04-19 ENCOUNTER — Telehealth: Payer: Self-pay | Admitting: Hematology

## 2017-04-19 ENCOUNTER — Inpatient Hospital Stay: Payer: PPO

## 2017-04-19 ENCOUNTER — Encounter: Payer: Self-pay | Admitting: Nurse Practitioner

## 2017-04-19 VITALS — BP 142/57 | HR 69 | Temp 98.0°F | Resp 16 | Ht 65.0 in | Wt 212.6 lb

## 2017-04-19 DIAGNOSIS — K219 Gastro-esophageal reflux disease without esophagitis: Secondary | ICD-10-CM

## 2017-04-19 DIAGNOSIS — G629 Polyneuropathy, unspecified: Secondary | ICD-10-CM

## 2017-04-19 DIAGNOSIS — E669 Obesity, unspecified: Secondary | ICD-10-CM

## 2017-04-19 DIAGNOSIS — R197 Diarrhea, unspecified: Secondary | ICD-10-CM | POA: Diagnosis not present

## 2017-04-19 DIAGNOSIS — E1165 Type 2 diabetes mellitus with hyperglycemia: Secondary | ICD-10-CM | POA: Diagnosis not present

## 2017-04-19 DIAGNOSIS — K449 Diaphragmatic hernia without obstruction or gangrene: Secondary | ICD-10-CM | POA: Diagnosis not present

## 2017-04-19 DIAGNOSIS — M25561 Pain in right knee: Secondary | ICD-10-CM

## 2017-04-19 DIAGNOSIS — C250 Malignant neoplasm of head of pancreas: Secondary | ICD-10-CM | POA: Diagnosis not present

## 2017-04-19 DIAGNOSIS — Z8619 Personal history of other infectious and parasitic diseases: Secondary | ICD-10-CM

## 2017-04-19 DIAGNOSIS — E785 Hyperlipidemia, unspecified: Secondary | ICD-10-CM

## 2017-04-19 DIAGNOSIS — I5030 Unspecified diastolic (congestive) heart failure: Secondary | ICD-10-CM

## 2017-04-19 DIAGNOSIS — R5383 Other fatigue: Secondary | ICD-10-CM | POA: Diagnosis not present

## 2017-04-19 DIAGNOSIS — Z8781 Personal history of (healed) traumatic fracture: Secondary | ICD-10-CM

## 2017-04-19 DIAGNOSIS — Z9049 Acquired absence of other specified parts of digestive tract: Secondary | ICD-10-CM

## 2017-04-19 DIAGNOSIS — E46 Unspecified protein-calorie malnutrition: Secondary | ICD-10-CM

## 2017-04-19 DIAGNOSIS — Z5111 Encounter for antineoplastic chemotherapy: Secondary | ICD-10-CM

## 2017-04-19 DIAGNOSIS — Z923 Personal history of irradiation: Secondary | ICD-10-CM

## 2017-04-19 DIAGNOSIS — L03115 Cellulitis of right lower limb: Secondary | ICD-10-CM | POA: Diagnosis not present

## 2017-04-19 DIAGNOSIS — Z95828 Presence of other vascular implants and grafts: Secondary | ICD-10-CM

## 2017-04-19 DIAGNOSIS — I517 Cardiomegaly: Secondary | ICD-10-CM

## 2017-04-19 DIAGNOSIS — M5136 Other intervertebral disc degeneration, lumbar region: Secondary | ICD-10-CM

## 2017-04-19 DIAGNOSIS — D649 Anemia, unspecified: Secondary | ICD-10-CM

## 2017-04-19 DIAGNOSIS — M7989 Other specified soft tissue disorders: Secondary | ICD-10-CM | POA: Diagnosis not present

## 2017-04-19 DIAGNOSIS — Z794 Long term (current) use of insulin: Secondary | ICD-10-CM

## 2017-04-19 DIAGNOSIS — Z9981 Dependence on supplemental oxygen: Secondary | ICD-10-CM

## 2017-04-19 DIAGNOSIS — J439 Emphysema, unspecified: Secondary | ICD-10-CM

## 2017-04-19 DIAGNOSIS — Z79899 Other long term (current) drug therapy: Secondary | ICD-10-CM

## 2017-04-19 DIAGNOSIS — I700 Atherosclerosis of aorta: Secondary | ICD-10-CM

## 2017-04-19 DIAGNOSIS — G6289 Other specified polyneuropathies: Secondary | ICD-10-CM

## 2017-04-19 DIAGNOSIS — I10 Essential (primary) hypertension: Secondary | ICD-10-CM

## 2017-04-19 DIAGNOSIS — Z7982 Long term (current) use of aspirin: Secondary | ICD-10-CM

## 2017-04-19 DIAGNOSIS — I2510 Atherosclerotic heart disease of native coronary artery without angina pectoris: Secondary | ICD-10-CM

## 2017-04-19 LAB — CBC WITH DIFFERENTIAL/PLATELET
BASOS ABS: 0.1 10*3/uL (ref 0.0–0.1)
BASOS PCT: 1 %
EOS ABS: 0.1 10*3/uL (ref 0.0–0.5)
EOS PCT: 1 %
HCT: 29.6 % — ABNORMAL LOW (ref 34.8–46.6)
Hemoglobin: 9.3 g/dL — ABNORMAL LOW (ref 11.6–15.9)
LYMPHS PCT: 26 %
Lymphs Abs: 1.7 10*3/uL (ref 0.9–3.3)
MCH: 25.7 pg (ref 25.1–34.0)
MCHC: 31.6 g/dL (ref 31.5–36.0)
MCV: 81.4 fL (ref 79.5–101.0)
Monocytes Absolute: 0.8 10*3/uL (ref 0.1–0.9)
Monocytes Relative: 12 %
Neutro Abs: 4 10*3/uL (ref 1.5–6.5)
Neutrophils Relative %: 60 %
PLATELETS: 436 10*3/uL — AB (ref 145–400)
RBC: 3.64 MIL/uL — AB (ref 3.70–5.45)
RDW: 24.8 % — ABNORMAL HIGH (ref 11.2–14.5)
WBC: 6.6 10*3/uL (ref 3.9–10.3)

## 2017-04-19 LAB — COMPREHENSIVE METABOLIC PANEL
ALT: 21 U/L (ref 0–55)
AST: 27 U/L (ref 5–34)
Albumin: 2.9 g/dL — ABNORMAL LOW (ref 3.5–5.0)
Alkaline Phosphatase: 258 U/L — ABNORMAL HIGH (ref 40–150)
Anion gap: 10 (ref 3–11)
BILIRUBIN TOTAL: 0.4 mg/dL (ref 0.2–1.2)
BUN: 34 mg/dL — AB (ref 7–26)
CO2: 27 mmol/L (ref 22–29)
CREATININE: 0.94 mg/dL (ref 0.60–1.10)
Calcium: 9.6 mg/dL (ref 8.4–10.4)
Chloride: 103 mmol/L (ref 98–109)
GFR calc Af Amer: >60 mL/min (ref >60)
GFR, EST NON AFRICAN AMERICAN: 60 mL/min — AB (ref >60)
Glucose, Bld: 153 mg/dL — ABNORMAL HIGH (ref 70–140)
POTASSIUM: 3.9 mmol/L (ref 3.5–5.1)
Sodium: 140 mmol/L (ref 136–145)
TOTAL PROTEIN: 7 g/dL (ref 6.4–8.3)

## 2017-04-19 MED ORDER — SODIUM CHLORIDE 0.9% FLUSH
10.0000 mL | INTRAVENOUS | Status: DC | PRN
  Administered 2017-04-19: 10 mL
  Filled 2017-04-19: qty 10

## 2017-04-19 MED ORDER — SODIUM CHLORIDE 0.9 % IV SOLN
Freq: Once | INTRAVENOUS | Status: AC
  Administered 2017-04-19: 15:00:00 via INTRAVENOUS

## 2017-04-19 MED ORDER — SODIUM CHLORIDE 0.9 % IV SOLN
2000.0000 mg | Freq: Once | INTRAVENOUS | Status: AC
  Administered 2017-04-19: 2000 mg via INTRAVENOUS
  Filled 2017-04-19: qty 52.6

## 2017-04-19 MED ORDER — PROCHLORPERAZINE MALEATE 10 MG PO TABS
10.0000 mg | ORAL_TABLET | Freq: Once | ORAL | Status: AC
  Administered 2017-04-19: 10 mg via ORAL

## 2017-04-19 MED ORDER — SODIUM CHLORIDE 0.9% FLUSH
10.0000 mL | INTRAVENOUS | Status: DC | PRN
  Administered 2017-04-19: 10 mL via INTRAVENOUS
  Filled 2017-04-19: qty 10

## 2017-04-19 MED ORDER — PROCHLORPERAZINE MALEATE 10 MG PO TABS
ORAL_TABLET | ORAL | Status: AC
  Filled 2017-04-19: qty 1

## 2017-04-19 MED ORDER — HEPARIN SOD (PORK) LOCK FLUSH 100 UNIT/ML IV SOLN
500.0000 [IU] | Freq: Once | INTRAVENOUS | Status: AC | PRN
  Administered 2017-04-19: 500 [IU]
  Filled 2017-04-19: qty 5

## 2017-04-19 NOTE — Patient Instructions (Signed)
[facility] °Discharge Instructions for Patients Receiving Chemotherapy ° °Today you received the following chemotherapy agents Gemzar ° °To help prevent nausea and vomiting after your treatment, we encourage you to take your nausea medication as directed. °  °If you develop nausea and vomiting that is not controlled by your nausea medication, call the clinic.  ° °BELOW ARE SYMPTOMS THAT SHOULD BE REPORTED IMMEDIATELY: °· *FEVER GREATER THAN 100.5 F °· *CHILLS WITH OR WITHOUT FEVER °· NAUSEA AND VOMITING THAT IS NOT CONTROLLED WITH YOUR NAUSEA MEDICATION °· *UNUSUAL SHORTNESS OF BREATH °· *UNUSUAL BRUISING OR BLEEDING °· TENDERNESS IN MOUTH AND THROAT WITH OR WITHOUT PRESENCE OF ULCERS °· *URINARY PROBLEMS °· *BOWEL PROBLEMS °· UNUSUAL RASH °Items with * indicate a potential emergency and should be followed up as soon as possible. ° °Feel free to call the clinic should you have any questions or concerns. The clinic phone number is [phone]. ° °Please show the CHEMO ALERT CARD at check-in to the Emergency Department and triage nurse. ° ° °

## 2017-04-19 NOTE — Telephone Encounter (Signed)
Appointments scheduled AVS/Calendar printed per 2/27 los °

## 2017-04-19 NOTE — Progress Notes (Addendum)
[place]  [phone] [phone]  Clinic Follow up Note   Patient Care Team: [name], MD as PCP - General (Internal Medicine) Rigoberto Noel, MD as Consulting Physician (Pulmonary Disease) Larey Dresser, MD as Consulting Physician (Cardiology) Megan Salon, MD as Consulting Physician (Gynecology) Kristeen Miss, MD as Consulting Physician (Neurosurgery) Earlie Server, MD (Orthopedic Surgery) Almedia Balls, MD (Orthopedic Surgery) Lafayette Dragon, MD (Inactive) (Gastroenterology) Renato Shin, MD (Endocrinology) Darleen Crocker, MD (Ophthalmology) 04/19/2017  SUMMARY OF ONCOLOGIC HISTORY: Oncology History   Cancer Staging Malignant neoplasm of head of pancreas [facility]) Staging form: Exocrine Pancreas, AJCC 8th Edition - Clinical stage from 04/28/2016: Stage IB (cT2, cN0, cM0) - Signed by Truitt Merle, MD on 02/01/2017       Malignant neoplasm of head of pancreas ([place])   02/2016 Initial Diagnosis    Malignant neoplasm of head of pancreas ([place])      03/15/2016 - 03/21/2016 Hospital Admission    Presents with nausea, vomiting, and jaundice   Patient was found hyperbilirubinemic with abnormal abdominal CT suggesting obstructive physiology. Referred for admission and evaluation.      03/15/2016 Imaging    CT Abdomen Pelvis w Contrast 1. Cholecystectomy with borderline biliary duct dilatation. 2. Pancreatic atrophy with moderate pancreatic duct dilatation, followed to the level of the duodenum. 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 Considerations include otherwise occult stricture, periampullary lesion, or main duct intraductal papillary mucinous neoplasm. Given the combination of borderline biliary duct dilatation, moderate pancreatic duct dilatation, and the clinical history of jaundice, consider further evaluation with ERCP. If the patient is not a good ERCP candidate, MRCP with and without contrast would be another  option. 3.  No acute process in the abdomen or pelvis. 4.  Tiny hiatal hernia. 5.  Aortic atherosclerosis.      03/16/2016 Imaging    MRI Abdomen 1. Hypoenhancing lesion in the uncinate process of the pancreas, better demonstrated by MR than on the recent CT scan. The dilated main pancreatic duct and dilated common bile duct abruptly terminate at the cranial margin of this lesion. MR imaging features are concerning for pancreatic adenocarcinoma. ERCP with endoscopic ultrasound would likely prove helpful to further evaluate. 2. No definite metastatic disease in the liver. No peripancreatic lymphadenopathy is identified. Of note, this exam is limited by marked motion degradation and small liver lesions could be obscured. CT scan earlier today demonstrated no peripancreatic lymphadenopathy.      03/18/2016 Procedure    ERCP 1. Malignant-appearing distal bile duct stricturestatus post ERCP with sphincterotomy and plastic biliary stent placement 2. Guidewire finding false lumen with subsequentinjection as described 3. Status post cholecystectomy  Recommendation: 1. Clear liquid diet 2. Unasyn 1.5 g every 6 hours 3. Standard post ERCP observation. Indomethacin suppositories given 4. Trend LFTs 5. Will need endoscopic ultrasound with biopsy with Dr. Ardis Hughs outpatient.      04/28/2016 Pathology Results    Endoscopic [place] and Biopsy FINE NEEDLE ASPIRATION, ENDOSCOPIC, PANCREAS UNCINATE (SPECIMEN 1 OF 1 COLLECTED 04/28/16): MALIGNANT CELLS CONSISTENT WITH ADENOCARCINOMA.      05/04/2016 -  Hospital Admission    Presents with fever, abdominal pain, malaise, confusion. She was treated for acute cholangitis.       05/05/2016 Imaging    CT Abdomen Pelvis w Contrast Interval placement of a bile duct stent with some decompression of the bile ducts. Mild residual biliary dilatation. Pancreatic ductal dilatation and atrophy. Known pancreatic mass lesion is not well depicted at CT. No  evidence  of bowel obstruction or inflammation.      05/05/2016 Imaging    CT Head w/o Contrast 05/05/16 IMPRESSION: No acute intracranial pathology.      05/06/2016 Procedure    ERCP and Bilary stent placement  Imaging demonstrating removal of endoscopic common bile duct stent. The stent was not able to be replaced.       05/07/2016 Imaging    CT Chest wo Contrast  1. New small right and trace left pleural effusions. Mild cardiomegaly. 2. Borderline enlarged right hilar lymph node at 1 cm in short axis. 3. No definite findings of metastatic disease to the chest. 4. Centrilobular emphysema. 5. High density in the visualized part of the dorsal pancreatic duct, probably contrast medium. 6. Subacute healing fracture of the right seventh rib laterally. 7. Atherosclerosis, including the left anterior descending coronary artery.      05/07/2016 Procedure    On 05/07/16, percutaneous transhepatic cholangiogram and placement of a 52F internal/external biliary drain was performed by IR with aspirated purulent bile sent for culture. Bile culture grew pantoea species, enterococcus faecalis, viridans streptococcus.      07/11/2016 - 07/21/2016 Radiation Therapy    Diagnosis:   cT2N0M0  adenocarcinoma of the pancreas  Indication for treatment:  Curative  Radiation treatment dates:   07/11/16 - 07/21/16  Site/dose:   Pancreas: 33 Gy in 5 fractions  Beams/energy:   SBRT/SRT-VMAT // 10X-FFF Photon  Narrative: The patient tolerated radiation treatment relatively well. The patient complained of diarrhea during treatment. She was instructed to purchase Imodium.      08/2016 - 10/2016 Chemotherapy    She had chemotherapy consistent of gemcitabine and abraxine for 2-3 months under the care of Dr. Berneice Gandy at [facility] with her last treatment being about 10/2016.        02/20/2017 Imaging    CT CAP 02/20/17  IMPRESSION: 1. Response to therapy of the primary pancreatic head/uncinate process lesion. Please  note that delineation of the tumor is difficult secondary to anatomic distortion from therapy. 2. No new or progressive disease identified. 3. Removal of biliary stent, with pneumobilia and borderline similar intrahepatic biliary duct dilatation. 4.  No acute process or evidence of metastatic disease in the chest. 5.  Possible constipation. 6. Coronary artery atherosclerosis. Aortic Atherosclerosis (ICD10-I70.0). 7.  Emphysema (ICD10-J43.9). 8. Pulmonary artery enlargement suggests pulmonary arterial hypertension.      02/22/2017 -  Chemotherapy    Single agent Gemcitabine every 2-3 weeks on and 1 week off, starting 02/22/17      CURRENT THERAPY:  single agent gemcitabine 2 weeks on and 1 week off starting 02/22/17   INTERVAL HISTORY: Ms. Knoche returns for follow-up as scheduled prior to next cycle of gemcitabine.  She was last treated on 03/28/2017.  She reports some difficulty getting around due to low energy but is up and about more than half of the day. Has intermittent chills without fever. Denies cough, dyspnea, palpitations, or chest pain.  She has a good appetite, eating and drinking well and cooking often.  Denies nausea, vomiting, or constipation.  Has diarrhea at baseline, 2+ episodes per day.  Has not used Lomotil.  She denies blood in her stool. Legs remain swollen and red, she completed clindamycin for cellulitis. Right leg is painful. No weeping or open sores. She reports burning to tops of her feet with occasional numbness to fingertips and toes. Occasionally affects sleep. Present prior to chemo, on lyrica and neurontin per PCP.  REVIEW OF SYSTEMS:  Constitutional: Denies fevers, chills or abnormal weight loss (+) fatigue Ears, nose, mouth, throat, and face: Denies mucositis or sore throat Respiratory: Denies cough, dyspnea or wheezes Cardiovascular: Denies palpitation, chest discomfort (+) lower extremity swelling, R>L Gastrointestinal:  Denies nausea, vomiting,  constipation, heartburn or change in bowel habits (+) diarrhea, 2+ episodes per day, at baseline  Skin: Denies abnormal skin rashes (+) lower extremity redness  Lymphatics: Denies new lymphadenopathy or easy bruising Neurological:Denies new weaknesses (+) burning to tops of feet (+) occasional numbness to fingertips and toes  Behavioral/Psych: Mood is stable, no new changes  All other systems were reviewed with the patient and are negative.  MEDICAL HISTORY:  Past Medical History:  Diagnosis Date  . Anemia   . [facility])    Pancreatic  . Cellulitis    LOWER EXTREMITIES  . Chronic diarrhea    a/w nausea - felt related to IBS  . Deaf    left side only  . Diastolic CHF ([place])   . Disc degeneration, lumbar   . GERD (gastroesophageal reflux disease)   . Hyperlipidemia    hx rhabdo on statins  . Hypertension   . Neuropathy    feet, toes and fingers  . On home oxygen therapy    uses oxygen 2 liters min per [place] at night and prn during day  . OSA (obstructive sleep apnea)    05/2009 sleep study - refuses CPAP  . Osteoarthritis   . RLS (restless legs syndrome)   . Shortness of breath    chronic  . Stasis dermatitis   . Type II or unspecified type diabetes mellitus without mention of complication, not stated as uncontrolled    insulin dep    SURGICAL HISTORY: Past Surgical History:  Procedure Laterality Date  . BILIARY STENT PLACEMENT N/A 05/06/2016   Procedure: BILIARY STENT PLACEMENT;  Surgeon: Gatha Mayer, MD;  Location: [place];  Service: Endoscopy;  Laterality: N/A;  . CHOLECYSTECTOMY  1997  . COLONOSCOPY N/A 12/03/2012   Procedure: COLONOSCOPY;  Surgeon: Lafayette Dragon, MD;  Location: [facility];  Service: Endoscopy;  Laterality: N/A;  . ENDOSCOPIC RETROGRADE CHOLANGIOPANCREATOGRAPHY (ERCP) WITH PROPOFOL N/A 05/06/2016   Procedure: ENDOSCOPIC RETROGRADE CHOLANGIOPANCREATOGRAPHY (ERCP) WITH PROPOFOL;  Surgeon: Gatha Mayer, MD;  Location: [place];  Service:  Endoscopy;  Laterality: N/A;  . ERCP N/A 03/18/2016   Procedure: ENDOSCOPIC RETROGRADE CHOLANGIOPANCREATOGRAPHY (ERCP);  Surgeon: Irene Shipper, MD;  Location: [facility];  Service: Endoscopy;  Laterality: N/A;  . EUS N/A 04/28/2016   Procedure: UPPER ENDOSCOPIC ULTRASOUND (EUS) LINEAR;  Surgeon: [name], MD;  Location: [facility];  Service: Endoscopy;  Laterality: N/A;  . IR CHOLANGIOGRAM EXISTING TUBE  06/09/2016  . IR GENERIC HISTORICAL  05/07/2016   IR BILIARY DRAIN PLACEMENT WITH CHOLANGIOGRAM 05/07/2016 Jacqulynn Cadet, MD [facility]-INTERV RAD  . IR GENERIC HISTORICAL  05/19/2016   IR BILIARY STENT(S) EXISTING ACCESS INC DILATION CATH EXCHANGE 05/19/2016 Jacqulynn Cadet, MD [facility]-INTERV RAD  . TONSILLECTOMY  1970  . TUBAL LIGATION  1980  . UMBILICAL HERNIA REPAIR  1995  . uterine polyp removal  2008    I have reviewed the social history and family history with the patient and they are unchanged from previous note.  ALLERGIES:  is allergic to morphine; statins; sulfa antibiotics; cymbalta [duloxetine hcl]; sulfasalazine; levemir [insulin detemir]; and zinc.  MEDICATIONS:  Current Outpatient Medications  Medication Sig Dispense Refill  . aspirin EC 81 MG tablet Take 1 tablet (81 mg total) by  mouth daily.    . carvedilol (COREG) 25 MG tablet TAKE ONE TABLET TWICE DAILY WITH FOOD 60 tablet 5  . Cholecalciferol (VITAMIN D3) 2000 units capsule Take 1 capsule (2,000 Units total) by mouth daily. 90 capsule 3  . Continuous Blood Gluc Sensor (FREESTYLE LIBRE SENSOR SYSTEM) MISC Use to check sugars 1 each 0  . gabapentin (NEURONTIN) 300 MG capsule TAKE TWO CAPSULES FOUR TIMES DAILY 240 capsule 1  . hydrALAZINE (APRESOLINE) 25 MG tablet TAKE 1 TABLET (25 MG TOTAL) BY MOUTH 3 (THREE) TIMES DAILY. 90 tablet 3  . insulin aspart (NOVOLOG) 100 UNIT/ML injection Inject 0-20 Units into the skin 3 (three) times daily with meals. . CBG 70 - 120: 0 units CBG 121 - 150: 3 units CBG 151 - 200: 4 units CBG  201 - 250: 7 units CBG 251 - 300: 11 units CBG 301 - 350: 15 units CBG 351 - 400: 20 units    . insulin glargine (LANTUS) 100 UNIT/ML injection Inject 0.3 mLs (30 Units total) into the skin daily.    [name] lidocaine (LIDODERM) 5 % Place 1 patch daily onto the skin. Remove & Discard patch within 12 hours or as directed by MD 30 patch 0  . lidocaine-prilocaine (EMLA) cream Apply to port site prior to being accessed    . lipase/protease/amylase (CREON) 36000 UNITS CPEP capsule 2 po prior to snacks; 4 po prior to meals    . metFORMIN (GLUCOPHAGE) 500 MG tablet Take 500 mg by mouth daily with breakfast.    . mirtazapine (REMERON) 30 MG tablet Take 1 tablet (30 mg total) by mouth at bedtime. 30 tablet 2  . mupirocin ointment (BACTROBAN) 2 % Apply 1 application topically daily. 90 g 0  . ONETOUCH VERIO test strip USE AS INSTRUCTED UP TO 4 TIMES DAILY 200 each 11  . oxyCODONE (OXY IR/ROXICODONE) 5 MG immediate release tablet Take 1 tablet (5 mg total) by mouth 2 (two) times daily as needed for severe pain. 60 tablet 0  . potassium chloride SA (K-DUR,KLOR-CON) 20 MEQ tablet Take 20 mEq by mouth daily.    . pregabalin (LYRICA) 75 MG capsule Take 1 capsule (75 mg total) daily by mouth. 30 capsule 3  . rOPINIRole (REQUIP XL) 4 MG 24 hr tablet TAKE ONE TABLET AT BEDTIME 30 tablet 5  . rOPINIRole (REQUIP) 2 MG tablet Take 1 tablet (2 mg total) by mouth 3 (three) times daily. 90 tablet 2  . sertraline (ZOLOFT) 50 MG tablet Take 50 mg by mouth daily.    . silver sulfADIAZINE (SILVADENE) 1 % cream Apply 1 application daily topically. 50 g 0  . spironolactone (ALDACTONE) 25 MG tablet Take 1 tablet (25 mg total) by mouth daily. 90 tablet 3  . torsemide (DEMADEX) 100 MG tablet Take 0.5 tablets (50 mg total) by mouth daily. Take 100 mg (1 whole tablet) on chemo days and as needed for swelling/weight gain. 90 tablet 3  . traMADol (ULTRAM) 50 MG tablet Take 1 tablet (50 mg total) by mouth every 6 (six) hours as needed.  (Patient taking differently: Take 50 mg every 6 (six) hours as needed by mouth for moderate pain. ) 60 tablet 0  . clindamycin (CLEOCIN) 300 MG capsule Take 1 capsule (300 mg total) by mouth 3 (three) times daily. 21 capsule 0  . diphenoxylate-atropine (LOMOTIL) 2.5-0.025 MG tablet Take 1-2 tablets by mouth 4 (four) times daily as needed for diarrhea or loose stools. 120 tablet 2  . lactulose ([place])  10 GM/15ML solution Take 10 g by mouth daily as needed for mild constipation.    . meclizine (ANTIVERT) 25 MG tablet Take 25 mg 2 (two) times daily as needed by mouth for dizziness.     . ondansetron (ZOFRAN) 8 MG tablet Take 1 tablet (8 mg total) by mouth every 8 (eight) hours as needed for nausea or vomiting. 30 tablet 2   No current facility-administered medications for this visit.    Facility-Administered Medications Ordered in Other Visits  Medication Dose Route Frequency Provider Last Rate Last Dose  . ondansetron (ZOFRAN) injection 8 mg  8 mg Intravenous Once Cira Rue K, NP      . sodium chloride flush (NS) 0.9 % injection 10 mL  10 mL Intracatheter PRN Truitt Merle, MD   10 mL at 02/22/17 1729    PHYSICAL EXAMINATION: ECOG PERFORMANCE STATUS: 2 - Symptomatic, <50% confined to bed  Vitals:   04/19/17 1308  BP: (!) 142/57  Pulse: 69  Resp: 16  Temp: 98 F (36.7 C)  SpO2: 98%   Filed Weights   04/19/17 1308  Weight: 212 lb 9.6 oz (96.4 kg)    GENERAL:alert, no distress and comfortable. Not able to get up to exam table; presents in wheelchair  SKIN: skin color, texture, turgor are normal, no rashes or significant lesions EYES: normal, Conjunctiva are pink and non-injected, sclera clear OROPHARYNX:no exudate, no erythema and lips, buccal mucosa, and tongue normal (+) dry mucous membranes LYMPH:  no palpable cervical or supraclavicular lymphadenopathy LUNGS: clear to auscultation bilaterally with normal breathing effort HEART: regular rate & rhythm (+) murmur (+) R>L lower  extremity edema skin erythema up to right knee with significant tenderness (+) chronic stasis changes to lower extremities (+) pedal edema with dry skin  ABDOMEN:abdomen soft, non-tender and normal bowel sounds. (+) obese Musculoskeletal:no cyanosis of digits and no clubbing  NEURO: alert & oriented x 3 with fluent speech (+) moderately decreased vibratory sense to feet bilaterally per tuning fork exam  PAC without erythema   LABORATORY DATA:  I have reviewed the data as listed CBC Latest Ref Rng & Units 04/19/2017 04/05/2017 03/28/2017  WBC 3.9 - 10.3 K/uL 6.6 6.1 7.1  Hemoglobin 11.6 - 15.9 g/dL 9.3(L) 8.3(L) 8.9(L)  Hematocrit 34.8 - 46.6 % 29.6(L) 26.6(L) 28.2(L)  Platelets 145 - 400 K/uL 436(H) 266 233     CMP Latest Ref Rng & Units 04/19/2017 04/12/2017 04/05/2017  Glucose 70 - 140 mg/dL 153(H) 194(H) 411(H)  BUN 7 - 26 mg/dL 34(H) 24(H) 29(H)  Creatinine 0.60 - 1.10 mg/dL 0.94 1.11(H) 1.09  Sodium 136 - 145 mmol/L 140 137 136  Potassium 3.5 - 5.1 mmol/L 3.9 4.0 3.6  Chloride 98 - 109 mmol/L 103 102 99  CO2 22 - 29 mmol/L 27 25 26   Calcium 8.4 - 10.4 mg/dL 9.6 8.9 9.1  Total Protein 6.4 - 8.3 g/dL 7.0 - 6.5  Total Bilirubin 0.2 - 1.2 mg/dL 0.4 - 0.4  Alkaline Phos 40 - 150 U/L 258(H) - 279(H)  AST 5 - 34 U/L 27 - 70(H)  ALT 0 - 55 U/L 21 - 51    RADIOGRAPHIC STUDIES: I have personally reviewed the radiological images as listed and agreed with the findings in the report. No results found.   ASSESSMENT & PLAN: 72 y.o.female with PMH of IDDM with neuropathy, HFpEF, HTN, OSA on intermittent home oxygen, and recently diagnosed pancreatic mass s/p biliary stent 04/28/2016 who was recently treated for acute cholangitis, status  post cutaneous biliary drainage. She had biliary drainage tube exchange with peri-stent placement by IR Dr. Laurence Ferrari today  1. Pancreatic adenocarcinoma, borderline resectable, cT2N0M0, stage IB -Ms. Cutshaw appears stable. She completed 4 cycles of single  agent gemcitabine. She has required multiple treatment breaks. Last dose on 03/28/17. She has recovered well and appears at her baseline. Labs reviewed, CBC and CMP adequate for treatment today. Will proceed with single agent gemcitabine today and in 1 week, then 1 week off. Will obtain restaging CT in approx 1 month. Return in 1 week for f/u and gemcitabine.   2. RLE cellulitis  -She completed 2 courses of antibiotics, keflex and clindamycin. Erythema has improved but not resolved. Legs remain warm to touch, right leg is significantly more tender than left. No further antibiotics recommended at this time. Will monitor closely.   3. Peripheral neuropathy  -Likely secondary to DM. She has burning in her feet, on lyrica and 2400 mg neurontin per PCP. Neuropathy stable while on gemcitabine. Vibratory sense to feet is moderately decreased. We reviewed if neuropathy worsens, may have to reduce or discontinue chemo. She can use tramadol or oxycodone for significant pain that impairs sleep PRN.  4. Diarrhea, possibly related to pancreatic insufficiency -She has 2+ episodes diarrhea per day at baseline. Takes pancreatic enzymes as instructed. She has not tried lomotil; I reviewed dosing instructions and indications for use to avoid dehydration and electrolyte imbalance.   5. IDDM, morbid obesity -On metformin, lantus, and novolog. Her companion states BG has been severely elevated as high as 500 in the past, attributing to eating in the middle of the night. I encouraged her to eat healthy diet, increase water intake, and activity level. BG 153 on CMP today; will monitor. F/u with PCP.  6. Chronic diastolic heart failure -Was seen in cardiology f/u 2/20, increased torsemide to 100 mg x2 days then back to 50 mg daily and to repeat PRN for increased volume status. She was instructed to take 100 mg torsemide on chemo days with IV fluids. She is on fluid restriction to <2 L daily and salt restriction.   7.  Malnutrition and deconditioning -Appetite is increased on Remeron. Encouraged her to eat well and be active.   8. Chronic venous stasis -Past doppler studies have been normal, most recent US on 03/29/17. Again encouraged her to elevate legs while resting and wear compression stockings.   9. Goals of care -She understands chemotherapy may be discontinued if she has poor tolerance or unmanageable side effects.   10. Genetics  -Due to her personal history of pancreas cancer, she qualifies for genetics to rule out genetic mutation such as BRCA to determine if she qualifies for targeted therapy such as PARP inhibitor. She agrees to genetics referral. Order placed today.  PLAN -Labs reviewed, proceed with single agent gemcitabine today and again in 1 week, then 1 week off -Return for f/u in 1 week with next chemo -Restaging CT in approx 1 month -Genetics referral  -Maximize lomotil for diarrhea  Orders Placed This Encounter  Procedures  . Ambulatory referral to Genetics    Referral Priority:   Routine    Referral Type:   Consultation    Referral Reason:   Specialty Services Required    Number of Visits Requested:   1   All questions were answered. The patient knows to call the clinic with any problems, questions or concerns. No barriers to learning was detected. I spent 20 minutes counseling the patient face  to face. The total time spent in the appointment was 25 minutes and more than 50% was on counseling and review of test results     Alla Feeling, NP 04/19/17   Addendum  I have seen the patient, examined her. I agree with the assessment and and plan and have edited the notes.   Ms Moyd is clinically stable.  Her right lower extremity cellulitis has improved after the course of antibiotics.  Her fatigue has not changed much.  She is tolerating chemotherapy well overall, the main concern is her worsening neuropathy which are partially from her diabetes or previous chemo.  She is  on high-dose gabapentin, cannot tolerate Cymbalta previously.  I suggested her to use tramadol as needed.  If it gets worse, I may have to stop her chemo.  I will refer her to genetics to rule out BRCA 1/2 mutation and lynch syndrome to see if she is a candidate for PARP inhibitor or immunotherapy.  I will also check  MSI or MMR on her previous biopsy to see if she has MSI high disease.   Lab reviewed, will continue chemotherapy, and plan to repeat staging scan after this cycle. I will see her back in 3 weeks with a restaging CT scan.  Truitt Merle  04/19/2017

## 2017-04-24 ENCOUNTER — Telehealth: Payer: Self-pay | Admitting: *Deleted

## 2017-04-24 ENCOUNTER — Other Ambulatory Visit: Payer: Self-pay | Admitting: Internal Medicine

## 2017-04-24 NOTE — Telephone Encounter (Signed)
Control database checked last refill:03/27/2017 LOV:  12/30/2016

## 2017-04-24 NOTE — Telephone Encounter (Signed)
Received call from [place], [place] @ [facility] ) requesting a call back from nurse for pt's updates.   Spoke with Olivia Mackie, and was informed that she was called by family reporting that pt was unresponsive this am, and oxygen level at 87. Upon arrival, Linus Orn noted pt was wearing O2 at 2 L [place], and was responsive - able to answer questions appropriately.  BP  112/80, temp  99.8,  O2  Sat   96 on 2 L  [place]. Per Olivia Mackie, pt has  3+ feet edema,  Orbital and facial edema  With  Right eye more than Left eye.  Stating pt has little draining noted from her eyes.  Pt is also weaker.    Olivia Mackie called pt back this afternoon, and was informed by family that pt was able to get up and had a shower.  Pt did eat some foods, and went back to bed. [facility]     Phone       [phone].

## 2017-04-25 ENCOUNTER — Other Ambulatory Visit: Payer: PPO

## 2017-04-25 ENCOUNTER — Ambulatory Visit: Payer: PPO | Admitting: Nurse Practitioner

## 2017-04-25 ENCOUNTER — Ambulatory Visit: Payer: PPO

## 2017-04-26 ENCOUNTER — Inpatient Hospital Stay: Payer: PPO

## 2017-04-26 ENCOUNTER — Inpatient Hospital Stay: Payer: PPO | Attending: Hematology

## 2017-04-26 ENCOUNTER — Inpatient Hospital Stay (HOSPITAL_BASED_OUTPATIENT_CLINIC_OR_DEPARTMENT_OTHER): Payer: PPO | Admitting: Nurse Practitioner

## 2017-04-26 ENCOUNTER — Telehealth: Payer: Self-pay | Admitting: Hematology

## 2017-04-26 VITALS — BP 120/47 | HR 59 | Temp 97.9°F | Resp 16 | Ht 65.0 in

## 2017-04-26 DIAGNOSIS — I10 Essential (primary) hypertension: Secondary | ICD-10-CM | POA: Insufficient documentation

## 2017-04-26 DIAGNOSIS — L03115 Cellulitis of right lower limb: Secondary | ICD-10-CM | POA: Diagnosis not present

## 2017-04-26 DIAGNOSIS — Z95828 Presence of other vascular implants and grafts: Secondary | ICD-10-CM

## 2017-04-26 DIAGNOSIS — E1140 Type 2 diabetes mellitus with diabetic neuropathy, unspecified: Secondary | ICD-10-CM

## 2017-04-26 DIAGNOSIS — I2510 Atherosclerotic heart disease of native coronary artery without angina pectoris: Secondary | ICD-10-CM | POA: Diagnosis not present

## 2017-04-26 DIAGNOSIS — Z79899 Other long term (current) drug therapy: Secondary | ICD-10-CM

## 2017-04-26 DIAGNOSIS — M5136 Other intervertebral disc degeneration, lumbar region: Secondary | ICD-10-CM

## 2017-04-26 DIAGNOSIS — I878 Other specified disorders of veins: Secondary | ICD-10-CM | POA: Diagnosis not present

## 2017-04-26 DIAGNOSIS — R410 Disorientation, unspecified: Secondary | ICD-10-CM

## 2017-04-26 DIAGNOSIS — E785 Hyperlipidemia, unspecified: Secondary | ICD-10-CM

## 2017-04-26 DIAGNOSIS — Z9981 Dependence on supplemental oxygen: Secondary | ICD-10-CM | POA: Insufficient documentation

## 2017-04-26 DIAGNOSIS — N390 Urinary tract infection, site not specified: Secondary | ICD-10-CM | POA: Diagnosis not present

## 2017-04-26 DIAGNOSIS — J90 Pleural effusion, not elsewhere classified: Secondary | ICD-10-CM | POA: Insufficient documentation

## 2017-04-26 DIAGNOSIS — R300 Dysuria: Secondary | ICD-10-CM

## 2017-04-26 DIAGNOSIS — Z923 Personal history of irradiation: Secondary | ICD-10-CM

## 2017-04-26 DIAGNOSIS — D6481 Anemia due to antineoplastic chemotherapy: Secondary | ICD-10-CM | POA: Insufficient documentation

## 2017-04-26 DIAGNOSIS — I5030 Unspecified diastolic (congestive) heart failure: Secondary | ICD-10-CM | POA: Diagnosis not present

## 2017-04-26 DIAGNOSIS — J439 Emphysema, unspecified: Secondary | ICD-10-CM | POA: Insufficient documentation

## 2017-04-26 DIAGNOSIS — M62830 Muscle spasm of back: Secondary | ICD-10-CM | POA: Diagnosis not present

## 2017-04-26 DIAGNOSIS — C250 Malignant neoplasm of head of pancreas: Secondary | ICD-10-CM | POA: Insufficient documentation

## 2017-04-26 DIAGNOSIS — Z794 Long term (current) use of insulin: Secondary | ICD-10-CM | POA: Insufficient documentation

## 2017-04-26 DIAGNOSIS — Z5111 Encounter for antineoplastic chemotherapy: Secondary | ICD-10-CM | POA: Diagnosis not present

## 2017-04-26 DIAGNOSIS — R634 Abnormal weight loss: Secondary | ICD-10-CM

## 2017-04-26 DIAGNOSIS — N3090 Cystitis, unspecified without hematuria: Secondary | ICD-10-CM

## 2017-04-26 DIAGNOSIS — E46 Unspecified protein-calorie malnutrition: Secondary | ICD-10-CM | POA: Diagnosis not present

## 2017-04-26 DIAGNOSIS — K589 Irritable bowel syndrome without diarrhea: Secondary | ICD-10-CM

## 2017-04-26 DIAGNOSIS — Z7982 Long term (current) use of aspirin: Secondary | ICD-10-CM | POA: Insufficient documentation

## 2017-04-26 DIAGNOSIS — I872 Venous insufficiency (chronic) (peripheral): Secondary | ICD-10-CM

## 2017-04-26 DIAGNOSIS — L03116 Cellulitis of left lower limb: Secondary | ICD-10-CM | POA: Diagnosis not present

## 2017-04-26 DIAGNOSIS — I700 Atherosclerosis of aorta: Secondary | ICD-10-CM

## 2017-04-26 DIAGNOSIS — G4730 Sleep apnea, unspecified: Secondary | ICD-10-CM | POA: Diagnosis not present

## 2017-04-26 DIAGNOSIS — G2581 Restless legs syndrome: Secondary | ICD-10-CM | POA: Diagnosis not present

## 2017-04-26 DIAGNOSIS — R5383 Other fatigue: Secondary | ICD-10-CM | POA: Insufficient documentation

## 2017-04-26 LAB — COMPREHENSIVE METABOLIC PANEL
ALT: 19 U/L (ref 0–55)
AST: 45 U/L — AB (ref 5–34)
Albumin: 2.5 g/dL — ABNORMAL LOW (ref 3.5–5.0)
Alkaline Phosphatase: 325 U/L — ABNORMAL HIGH (ref 40–150)
Anion gap: 10 (ref 3–11)
BUN: 30 mg/dL — AB (ref 7–26)
CHLORIDE: 96 mmol/L — AB (ref 98–109)
CO2: 29 mmol/L (ref 22–29)
CREATININE: 1.01 mg/dL (ref 0.60–1.10)
Calcium: 9.5 mg/dL (ref 8.4–10.4)
GFR calc Af Amer: >60 mL/min (ref >60)
GFR calc non Af Amer: 55 mL/min — ABNORMAL LOW (ref >60)
GLUCOSE: 280 mg/dL — AB (ref 70–140)
Potassium: 4.3 mmol/L (ref 3.5–5.1)
SODIUM: 135 mmol/L — AB (ref 136–145)
Total Bilirubin: 0.7 mg/dL (ref 0.2–1.2)
Total Protein: 7 g/dL (ref 6.4–8.3)

## 2017-04-26 LAB — URINALYSIS, COMPLETE (UACMP) WITH MICROSCOPIC
BILIRUBIN URINE: NEGATIVE
Glucose, UA: NEGATIVE mg/dL
Ketones, ur: NEGATIVE mg/dL
NITRITE: NEGATIVE
PROTEIN: 30 mg/dL — AB
Specific Gravity, Urine: 1.013 (ref 1.005–1.030)
pH: 6 (ref 5.0–8.0)

## 2017-04-26 LAB — CBC WITH DIFFERENTIAL/PLATELET
Basophils Absolute: 0 10*3/uL (ref 0.0–0.1)
Basophils Relative: 0 %
EOS ABS: 0 10*3/uL (ref 0.0–0.5)
EOS PCT: 0 %
HCT: 28.5 % — ABNORMAL LOW (ref 34.8–46.6)
Hemoglobin: 8.8 g/dL — ABNORMAL LOW (ref 11.6–15.9)
LYMPHS ABS: 1 10*3/uL (ref 0.9–3.3)
Lymphocytes Relative: 17 %
MCH: 26.2 pg (ref 25.1–34.0)
MCHC: 30.9 g/dL — ABNORMAL LOW (ref 31.5–36.0)
MCV: 84.8 fL (ref 79.5–101.0)
MONOS PCT: 11 %
Monocytes Absolute: 0.6 10*3/uL (ref 0.1–0.9)
NRBC: 2 /100{WBCs} — AB
Neutro Abs: 4.3 10*3/uL (ref 1.5–6.5)
Neutrophils Relative %: 72 %
PLATELETS: 201 10*3/uL (ref 145–400)
RBC: 3.36 MIL/uL — ABNORMAL LOW (ref 3.70–5.45)
RDW: 20.9 % — AB (ref 11.2–14.5)
WBC: 5.9 10*3/uL (ref 3.9–10.3)

## 2017-04-26 MED ORDER — NITROFURANTOIN MONOHYD MACRO 100 MG PO CAPS: 100.0000 mg | ORAL_CAPSULE | Freq: Two times a day (BID) | ORAL | 0 refills | Status: DC

## 2017-04-26 MED ORDER — SODIUM CHLORIDE 0.9% FLUSH
10.0000 mL | INTRAVENOUS | Status: DC | PRN
  Administered 2017-04-26: 10 mL via INTRAVENOUS
  Filled 2017-04-26: qty 10

## 2017-04-26 MED ORDER — SODIUM CHLORIDE 0.9 % IV SOLN: 2000.0000 mg | Freq: Once | INTRAVENOUS | Status: DC

## 2017-04-26 MED ORDER — SODIUM CHLORIDE 0.9% FLUSH
10.0000 mL | INTRAVENOUS | Status: DC | PRN
  Administered 2017-04-26: 10 mL
  Filled 2017-04-26: qty 10

## 2017-04-26 MED ORDER — PROCHLORPERAZINE MALEATE 10 MG PO TABS
10.0000 mg | ORAL_TABLET | Freq: Once | ORAL | Status: AC
  Administered 2017-04-26: 10 mg via ORAL

## 2017-04-26 MED ORDER — PROCHLORPERAZINE MALEATE 10 MG PO TABS
ORAL_TABLET | ORAL | Status: AC
  Filled 2017-04-26: qty 1

## 2017-04-26 MED ORDER — SODIUM CHLORIDE 0.9 % IV SOLN
Freq: Once | INTRAVENOUS | Status: AC
  Administered 2017-04-26: 14:00:00 via INTRAVENOUS

## 2017-04-26 MED ORDER — HEPARIN SOD (PORK) LOCK FLUSH 100 UNIT/ML IV SOLN
500.0000 [IU] | Freq: Once | INTRAVENOUS | Status: AC | PRN
  Administered 2017-04-26: 500 [IU]
  Filled 2017-04-26: qty 5

## 2017-04-26 MED ORDER — SODIUM CHLORIDE 0.9 % IV SOLN
1700.0000 mg | Freq: Once | INTRAVENOUS | Status: AC
  Administered 2017-04-26: 1700 mg via INTRAVENOUS
  Filled 2017-04-26: qty 45

## 2017-04-26 MED ORDER — SODIUM CHLORIDE 0.9 % IV SOLN
Freq: Once | INTRAVENOUS | Status: AC
  Administered 2017-04-26: 15:00:00 via INTRAVENOUS

## 2017-04-26 NOTE — Telephone Encounter (Signed)
Appointments scheduled AVS/Calendar printed per 3/6 los °

## 2017-04-26 NOTE — Progress Notes (Signed)
[facility]  [phone] [phone]) [phone]  Clinic Follow up Note   Patient Care Team: [name], MD as PCP - General (Internal Medicine) Rigoberto Noel, MD as Consulting Physician (Pulmonary Disease) Larey Dresser, MD as Consulting Physician (Cardiology) Megan Salon, MD as Consulting Physician (Gynecology) Kristeen Miss, MD as Consulting Physician (Neurosurgery) Earlie Server, MD (Orthopedic Surgery) Almedia Balls, MD (Orthopedic Surgery) Lafayette Dragon, MD (Inactive) (Gastroenterology) Renato Shin, MD (Endocrinology) Darleen Crocker, MD (Ophthalmology)  Date of Service: 04/26/17  SUMMARY OF ONCOLOGIC HISTORY: Oncology History   Cancer Staging Malignant neoplasm of head of pancreas [facility]) Staging form: Exocrine Pancreas, AJCC 8th Edition - Clinical stage from 04/28/2016: Stage IB (cT2, cN0, cM0) - Signed by Truitt Merle, MD on 02/01/2017       Malignant neoplasm of head of pancreas ([facility])   02/2016 Initial Diagnosis    Malignant neoplasm of head of pancreas ([name])      03/15/2016 - 03/21/2016 Hospital Admission    Presents with nausea, vomiting, and jaundice   Patient was found hyperbilirubinemic with abnormal abdominal CT suggesting obstructive physiology. Referred for admission and evaluation.      03/15/2016 Imaging    CT Abdomen Pelvis w Contrast 1. Cholecystectomy with borderline biliary duct dilatation. 2. Pancreatic atrophy with moderate pancreatic duct dilatation, followed to the level of the duodenum. 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 Considerations include otherwise occult stricture, periampullary lesion, or main duct intraductal papillary mucinous neoplasm. Given the combination of borderline biliary duct dilatation, moderate pancreatic duct dilatation, and the clinical history of jaundice, consider further evaluation with ERCP. If the patient is not a good ERCP candidate, MRCP with and without contrast would be  another option. 3.  No acute process in the abdomen or pelvis. 4.  Tiny hiatal hernia. 5.  Aortic atherosclerosis.      03/16/2016 Imaging    MRI Abdomen 1. Hypoenhancing lesion in the uncinate process of the pancreas, better demonstrated by MR than on the recent CT scan. The dilated main pancreatic duct and dilated common bile duct abruptly terminate at the cranial margin of this lesion. MR imaging features are concerning for pancreatic adenocarcinoma. ERCP with endoscopic ultrasound would likely prove helpful to further evaluate. 2. No definite metastatic disease in the liver. No peripancreatic lymphadenopathy is identified. Of note, this exam is limited by marked motion degradation and small liver lesions could be obscured. CT scan earlier today demonstrated no peripancreatic lymphadenopathy.      03/18/2016 Procedure    ERCP 1. Malignant-appearing distal bile duct stricturestatus post ERCP with sphincterotomy and plastic biliary stent placement 2. Guidewire finding false lumen with subsequentinjection as described 3. Status post cholecystectomy  Recommendation: 1. Clear liquid diet 2. Unasyn 1.5 g every 6 hours 3. Standard post ERCP observation. Indomethacin suppositories given 4. Trend LFTs 5. Will need endoscopic ultrasound with biopsy with Dr. Ardis Hughs outpatient.      04/28/2016 Pathology Results    Endoscopic [place] and Biopsy FINE NEEDLE ASPIRATION, ENDOSCOPIC, PANCREAS UNCINATE (SPECIMEN 1 OF 1 COLLECTED 04/28/16): MALIGNANT CELLS CONSISTENT WITH ADENOCARCINOMA.      05/04/2016 -  Hospital Admission    Presents with fever, abdominal pain, malaise, confusion. She was treated for acute cholangitis.       05/05/2016 Imaging    CT Abdomen Pelvis w Contrast Interval placement of a bile duct stent with some decompression of the bile ducts. Mild residual biliary dilatation. Pancreatic ductal dilatation and atrophy. Known pancreatic mass lesion is not well  depicted at CT. No  evidence of bowel obstruction or inflammation.      05/05/2016 Imaging    CT Head w/o Contrast 05/05/16 IMPRESSION: No acute intracranial pathology.      05/06/2016 Procedure    ERCP and Bilary stent placement  Imaging demonstrating removal of endoscopic common bile duct stent. The stent was not able to be replaced.       05/07/2016 Imaging    CT Chest wo Contrast  1. New small right and trace left pleural effusions. Mild cardiomegaly. 2. Borderline enlarged right hilar lymph node at 1 cm in short axis. 3. No definite findings of metastatic disease to the chest. 4. Centrilobular emphysema. 5. High density in the visualized part of the dorsal pancreatic duct, probably contrast medium. 6. Subacute healing fracture of the right seventh rib laterally. 7. Atherosclerosis, including the left anterior descending coronary artery.      05/07/2016 Procedure    On 05/07/16, percutaneous transhepatic cholangiogram and placement of a 29F internal/external biliary drain was performed by IR with aspirated purulent bile sent for culture. Bile culture grew pantoea species, enterococcus faecalis, viridans streptococcus.      07/11/2016 - 07/21/2016 Radiation Therapy    Diagnosis:   cT2N0M0  adenocarcinoma of the pancreas  Indication for treatment:  Curative  Radiation treatment dates:   07/11/16 - 07/21/16  Site/dose:   Pancreas: 33 Gy in 5 fractions  Beams/energy:   SBRT/SRT-VMAT // 10X-FFF Photon  Narrative: The patient tolerated radiation treatment relatively well. The patient complained of diarrhea during treatment. She was instructed to purchase Imodium.      08/2016 - 10/2016 Chemotherapy    She had chemotherapy consistent of gemcitabine and abraxine for 2-3 months under the care of Dr. Berneice Gandy at [facility] with her last treatment being about 10/2016.        02/20/2017 Imaging    CT CAP 02/20/17  IMPRESSION: 1. Response to therapy of the primary pancreatic head/uncinate process lesion.  Please note that delineation of the tumor is difficult secondary to anatomic distortion from therapy. 2. No new or progressive disease identified. 3. Removal of biliary stent, with pneumobilia and borderline similar intrahepatic biliary duct dilatation. 4.  No acute process or evidence of metastatic disease in the chest. 5.  Possible constipation. 6. Coronary artery atherosclerosis. Aortic Atherosclerosis (ICD10-I70.0). 7.  Emphysema (ICD10-J43.9). 8. Pulmonary artery enlargement suggests pulmonary arterial hypertension.      02/22/2017 -  Chemotherapy    Single agent Gemcitabine every 2-3 weeks on and 1 week off, starting 02/22/17      CURRENT THERAPY: single agent gemcitabine 2 weeks on and 1 week off starting 02/22/17   INTERVAL HISTORY: Ms. Melissa [facility] returns for follow-up as scheduled prior to day 8 gemcitabine; last treated on 04/19/2017.  She reportedly had abrupt 10 pound weight loss over 3 days during this past week, unclear if related to fluid status or decreased p.o. intake. Patient is a poor historian and her visitor today is part-time caregiver.  Her appetite is fair, although somewhat decreased this week. Weight at home today was reportedly 210. She has decreased energy from baseline, resting most of the day this week while still up cooking occasionally at night.  She does not sleep well, takes mirtazapine.  Overnight on 04/22/2017 the paramedics were called to the home when the patient was found to be lethargic and confused, she needed help getting from bed to the commode, her husband has poor vision and could not provide assistance.  Paramedics applied  oxygen for O2 sat 87% and checked blood sugar which was reportedly "okay."  She does not really recall the event. The rest of Saturday and Sunday were uneventful for her.  She has required oxygen therapy in the past, uses 2 L PRN.  Denies cough, chest pain, dyspnea, fever or chills.  No BM in 1 or 2 days, reporting intermittent gas and  lower abdominal pain that occasionally radiates to her back. No nausea or vomiting. Has taken tramadol and oxycodone few times this week. Occasional numbness and tingling to fingertips and toes is stable.   REVIEW OF SYSTEMS:   Constitutional: Denies fevers, chills or abnormal weight loss (+) weight at home 210 today (+) increased fatigue (+) decreased po intake  Eyes: Denies blurriness of vision Ears, nose, mouth, throat, and face: Denies mucositis or sore throat Respiratory: Denies cough, dyspnea or wheezes (+) required O2 PRN for sat 87% at home 3/2 (+) history of PRN oxygen use Cardiovascular: Denies palpitation, chest discomfort (+) lower extremity swelling Gastrointestinal:  Denies nausea, vomiting, diarrhea, heartburn or change in bowel habits (+) no BM in 1-2 days (+) gas pain (+) lower abdominal pain, radiates to back Skin: Denies abnormal skin rashes (+) lower extremity skin changes, redness, hyperpigmentation Lymphatics: Denies new lymphadenopathy or easy bruising Neurological:Denies new weaknesses (+) occasional numbness and tingling to fingertips and toes, stable (+) reported episode of lethargy and confusion overnight 3/2; paramedics were called, not taken to ER Behavioral/Psych: Mood is stable, no new changes (+) difficulty with memory  All other systems were reviewed with the patient and are negative.  MEDICAL HISTORY:  Past Medical History:  Diagnosis Date  . Anemia   . Cancer [facility])    Pancreatic  . Cellulitis    LOWER EXTREMITIES  . Chronic diarrhea    a/w nausea - felt related to IBS  . Deaf    left side only  . Diastolic CHF ([place])   . Disc degeneration, lumbar   . GERD (gastroesophageal reflux disease)   . Hyperlipidemia    hx rhabdo on statins  . Hypertension   . Neuropathy    feet, toes and fingers  . On home oxygen therapy    uses oxygen 2 liters min per [place] at night and prn during day  . OSA (obstructive sleep apnea)    05/2009 sleep study - refuses CPAP    . Osteoarthritis   . RLS (restless legs syndrome)   . Shortness of breath    chronic  . Stasis dermatitis   . Type II or unspecified type diabetes mellitus without mention of complication, not stated as uncontrolled    insulin dep    SURGICAL HISTORY: Past Surgical History:  Procedure Laterality Date  . BILIARY STENT PLACEMENT N/A 05/06/2016   Procedure: BILIARY STENT PLACEMENT;  Surgeon: Gatha Mayer, MD;  Location: [facility];  Service: Endoscopy;  Laterality: N/A;  . CHOLECYSTECTOMY  1997  . COLONOSCOPY N/A 12/03/2012   Procedure: COLONOSCOPY;  Surgeon: Lafayette Dragon, MD;  Location: [facility];  Service: Endoscopy;  Laterality: N/A;  . ENDOSCOPIC RETROGRADE CHOLANGIOPANCREATOGRAPHY (ERCP) WITH PROPOFOL N/A 05/06/2016   Procedure: ENDOSCOPIC RETROGRADE CHOLANGIOPANCREATOGRAPHY (ERCP) WITH PROPOFOL;  Surgeon: Gatha Mayer, MD;  Location: [facility];  Service: Endoscopy;  Laterality: N/A;  . ERCP N/A 03/18/2016   Procedure: ENDOSCOPIC RETROGRADE CHOLANGIOPANCREATOGRAPHY (ERCP);  Surgeon: Irene Shipper, MD;  Location: [facility];  Service: Endoscopy;  Laterality: N/A;  . EUS N/A 04/28/2016   Procedure: UPPER ENDOSCOPIC ULTRASOUND (  EUS) LINEAR;  Surgeon: [name], MD;  Location: [facility];  Service: Endoscopy;  Laterality: N/A;  . IR CHOLANGIOGRAM EXISTING TUBE  06/09/2016  . IR GENERIC HISTORICAL  05/07/2016   IR BILIARY DRAIN PLACEMENT WITH CHOLANGIOGRAM 05/07/2016 Jacqulynn Cadet, MD [facility]  . IR GENERIC HISTORICAL  05/19/2016   IR BILIARY STENT(S) EXISTING ACCESS INC DILATION CATH EXCHANGE 05/19/2016 Jacqulynn Cadet, MD [facility]-INTER[facility]  . TONSILLECTOMY  1970  . TUBAL LIGATION  1980  . UMBILICAL HERNIA REPAIR  1995  . uterine polyp removal  2008    I have reviewed the social history and family history with the patient and they are unchanged from previous note.  ALLERGIES:  is allergic to morphine; statins; sulfa antibiotics; cymbalta [duloxetine hcl];  sulfasalazine; levemir [insulin detemir]; and zinc.  MEDICATIONS:  Current Outpatient Medications  Medication Sig Dispense Refill  . aspirin EC 81 MG tablet Take 1 tablet (81 mg total) by mouth daily.    . carvedilol (COREG) 25 MG tablet TAKE ONE TABLET TWICE DAILY WITH FOOD 60 tablet 5  . Cholecalciferol (VITAMIN D3) 2000 units capsule Take 1 capsule (2,000 Units total) by mouth daily. 90 capsule 3  . clindamycin (CLEOCIN) 300 MG capsule Take 1 capsule (300 mg total) by mouth 3 (three) times daily. 21 capsule 0  . Continuous Blood Gluc Sensor (FREESTYLE LIBRE SENSOR SYSTEM) MISC Use to check sugars 1 each 0  . diphenoxylate-atropine (LOMOTIL) 2.5-0.025 MG tablet Take 1-2 tablets by mouth 4 (four) times daily as needed for diarrhea or loose stools. 120 tablet 2  . gabapentin (NEURONTIN) 300 MG capsule TAKE TWO CAPSULES FOUR TIMES DAILY 240 capsule 1  . hydrALAZINE (APRESOLINE) 25 MG tablet TAKE ONE TABLET THREE TIMES DAILY 90 tablet 3  . insulin aspart (NOVOLOG) 100 UNIT/ML injection Inject 0-20 Units into the skin 3 (three) times daily with meals. . CBG 70 - 120: 0 units CBG 121 - 150: 3 units CBG 151 - 200: 4 units CBG 201 - 250: 7 units CBG 251 - 300: 11 units CBG 301 - 350: 15 units CBG 351 - 400: 20 units    . insulin glargine (LANTUS) 100 UNIT/ML injection Inject 0.3 mLs (30 Units total) into the skin daily.    [name] lactulose (CHRONULAC) 10 GM/15ML solution Take 10 g by mouth daily as needed for mild constipation.    . lidocaine (LIDODERM) 5 % Place 1 patch daily onto the skin. Remove & Discard patch within 12 hours or as directed by MD 30 patch 0  . lidocaine-prilocaine (EMLA) cream Apply to port site prior to being accessed    . lipase/protease/amylase (CREON) 36000 UNITS CPEP capsule 2 po prior to snacks; 4 po prior to meals    . LYRICA 75 MG capsule TAKE ONE CAPSULE EACH DAY 30 capsule 3  . metFORMIN (GLUCOPHAGE) 500 MG tablet Take 500 mg by mouth daily with breakfast.    .  mirtazapine (REMERON) 30 MG tablet Take 1 tablet (30 mg total) by mouth at bedtime. 30 tablet 2  . mupirocin ointment (BACTROBAN) 2 % Apply 1 application topically daily. 90 g 0  . ONETOUCH VERIO test strip USE AS INSTRUCTED UP TO 4 TIMES DAILY 200 each 11  . oxyCODONE (OXY IR/ROXICODONE) 5 MG immediate release tablet Take 1 tablet (5 mg total) by mouth 2 (two) times daily as needed for severe pain. 60 tablet 0  . potassium chloride SA (K-DUR,KLOR-CON) 20 MEQ tablet Take 20 mEq by mouth daily.    [name]  rOPINIRole (REQUIP XL) 4 MG 24 hr tablet TAKE ONE TABLET AT BEDTIME 30 tablet 5  . rOPINIRole (REQUIP) 2 MG tablet Take 1 tablet (2 mg total) by mouth 3 (three) times daily. 90 tablet 2  . sertraline (ZOLOFT) 50 MG tablet Take 50 mg by mouth daily.    . silver sulfADIAZINE (SILVADENE) 1 % cream Apply 1 application daily topically. 50 g 0  . spironolactone (ALDACTONE) 25 MG tablet Take 1 tablet (25 mg total) by mouth daily. 90 tablet 3  . torsemide (DEMADEX) 100 MG tablet Take 0.5 tablets (50 mg total) by mouth daily. Take 100 mg (1 whole tablet) on chemo days and as needed for swelling/weight gain. 90 tablet 3  . traMADol (ULTRAM) 50 MG tablet Take 1 tablet (50 mg total) by mouth every 6 (six) hours as needed. (Patient taking differently: Take 50 mg every 6 (six) hours as needed by mouth for moderate pain. ) 60 tablet 0  . meclizine (ANTIVERT) 25 MG tablet Take 25 mg 2 (two) times daily as needed by mouth for dizziness.     . nitrofurantoin, macrocrystal-monohydrate, (MACROBID) 100 MG capsule Take 1 capsule (100 mg total) by mouth 2 (two) times daily. 10 capsule 0  . ondansetron (ZOFRAN) 8 MG tablet Take 1 tablet (8 mg total) by mouth every 8 (eight) hours as needed for nausea or vomiting. 30 tablet 2   No current facility-administered medications for this visit.    Facility-Administered Medications Ordered in Other Visits  Medication Dose Route Frequency Provider Last Rate Last Dose  . ondansetron  (ZOFRAN) injection 8 mg  8 mg Intravenous Once Cira Rue K, NP      . sodium chloride flush (NS) 0.9 % injection 10 mL  10 mL Intracatheter PRN Truitt Merle, MD   10 mL at 02/22/17 1729    PHYSICAL EXAMINATION: ECOG PERFORMANCE STATUS: 3 - Symptomatic, >50% confined to bed  Vitals:   04/26/17 1233  BP: (!) 120/47  Pulse: (!) 59  Resp: 16  Temp: 97.9 F (36.6 C)  SpO2: 100%   GENERAL:alert, no distress and comfortable. (+) presents in wheelchair  SKIN: No rash or significant lesions (+) pale EYES: normal, Conjunctiva are pink and non-injected, sclera clear OROPHARYNX:no exudate, no erythema and lips, buccal mucosa, and tongue normal. Dry mucous membranes LYMPH:  no palpable cervical or supraclavicular lymphadenopathy LUNGS: clear to auscultation bilaterally with normal breathing effort HEART: regular rate & rhythm and (+) murmur (+) bilateral lower extremity edema with venous stasis changes, right leg erythematous  ABDOMEN:abdomen soft, non-tender and normal bowel sounds. (+) Obese.  Musculoskeletal:no cyanosis of digits and no clubbing  NEURO: alert & oriented x 3 with fluent speech PAC without erythema   LABORATORY DATA:  I have reviewed the data as listed CBC Latest Ref Rng & Units 04/26/2017 04/19/2017 04/05/2017  WBC 3.9 - 10.3 K/uL 5.9 6.6 6.1  Hemoglobin 11.6 - 15.9 g/dL 8.8(L) 9.3(L) 8.3(L)  Hematocrit 34.8 - 46.6 % 28.5(L) 29.6(L) 26.6(L)  Platelets 145 - 400 K/uL 201 436(H) 266     CMP Latest Ref Rng & Units 04/26/2017 04/19/2017 04/12/2017  Glucose 70 - 140 mg/dL 280(H) 153(H) 194(H)  BUN 7 - 26 mg/dL 30(H) 34(H) 24(H)  Creatinine 0.60 - 1.10 mg/dL 1.01 0.94 1.11(H)  Sodium 136 - 145 mmol/L 135(L) 140 137  Potassium 3.5 - 5.1 mmol/L 4.3 3.9 4.0  Chloride 98 - 109 mmol/L 96(L) 103 102  CO2 22 - 29 mmol/L 29 27 25   Calcium  8.4 - 10.4 mg/dL 9.5 9.6 8.9  Total Protein 6.4 - 8.3 g/dL 7.0 7.0 -  Total Bilirubin 0.2 - 1.2 mg/dL 0.7 0.4 -  Alkaline Phos 40 - 150 U/L 325(H)  258(H) -  AST 5 - 34 U/L 45(H) 27 -  ALT 0 - 55 U/L 19 21 -      RADIOGRAPHIC STUDIES: I have personally reviewed the radiological images as listed and agreed with the findings in the report. No results found.   ASSESSMENT & PLAN: 72 y.o.female with PMH of IDDM with neuropathy, HFpEF, HTN, OSA on intermittent home oxygen, and recently diagnosed pancreatic mass s/p biliary stent 04/28/2016 who was recently treated for acute cholangitis, status post cutaneous biliary drainage. She had biliary drainage tube exchange with peri-stent placement by IR Dr. Laurence Ferrari   1. Pancreatic adenocarcinoma, borderline resectable, cT2N0M0, stage IB -Ms. Soley appears stable. She completed 5 doses single agent gemcitabine on 2/27. Has been difficult for her to adhere to day 1/day 8 dosing schedule due to cellulitis, deconditioning, and decreased performance status. She has increased fatigue from her baseline but I feel she is overall stable to tolerate chemo today. Will reduce dose to 800 mg/m2. She will return in 1 week for lab, f/u, and possible IVF. Will obtain restaging scan to determine if progressive disease is contributing to her fatigue and deconditioning.   2. Dysuria  -She reported dysuria to treatment RN, UA indicative of urinary tract infection. Will begin treatment with macrobid and f/u culture and sensitivity. Renal function is adequate.  3. Anemia  -Multifactorial, related to chemotherapy and possibly some degree of chronic disease. Hgb 8.8 today which is likely contributing to her fatigue. Will monitor closely, does not need transfusion today.  3. Lethargy, confusion, increased O2 demand 04/22/17 -Details of the event are unclear due to patient's lack of memory and her caregiver present today was not there on 04/22/17 when paramedics were called. Symptoms may be related to recent pain medication, acute urinary tract infection, anemia and/or dehydration, and being sedentary this week due to  increased chemo-related fatigue. O2 sat 100% in clinic. Bp 120/47 today. Will give supplemental IV fluids today.   4. RLE cellulitis -S/p treatement with keflex and clindamycin. Right leg erythema is stable, but not resolved. She has chronic venous stasis changes in both legs. She is afebrile. Will monitor.   5. Diarrhea, possibly related to pancreatic insufficiency -Has had diarrhea recently; has not had BM in 1 or 2 days. May be related to recent opioid use for her abdominal and back pain. She has lactulose at home to use for constipation, I encouraged her to use today to induce BM. Also encouraged her to increase activity as tolerated and drink more water.   6. IDDM, morbid obesity -Continue f/u with PCP. BG 280 in clinic today, will get IVF with chemotherapy.  7. Chronic diastolic heart failure -Continue f/u with cardiology and PCP. Diuretics have been adjusted lately; LE edema appears stable. Increased O2 demand may have cardiology component. F/u with cardiology scheduled on 3/20.   8. Malnutrition and deconditioning -was previously in PT while living at rehab facility.   9. Chronic venous stasis -negative doppler on 03/29/17. Chemo held on 04/05/17 for fluid status. Diuretics have been adjusted lately to improve LE edema.   10. Goals of care discussion She is not a surgical candidate per Dr. Barry Dienes and Dr. Mariah Milling at [facility]. She completed RT for local therapy. She understands the goal of treatment is disease control.  PLAN -500 cc NS today over 1.5 hours -Labs reviewed, ok to proceed with day 8 chemo, single agent gemcitabine at reduced dose 800 mg/m2 -Return in 1 week for lab, f/u, and IVF -Restaging CT in 1 week -UA complete, culture pending - begin macrobid for cystitis    Orders Placed This Encounter  Procedures  . Urine Culture    Standing Status:   Future    Number of Occurrences:   1    Standing Expiration Date:   04/26/2018  . Urinalysis, Complete w Microscopic    Standing  Status:   Future    Number of Occurrences:   1    Standing Expiration Date:   04/27/2018   All questions were answered. The patient knows to call the clinic with any problems, questions or concerns. No barriers to learning was detected. I spent 25 minutes counseling the patient face to face. The total time spent in the appointment was 30 minutes and more than 50% was on counseling and review of test results.     Alla Feeling, NP 04/27/17

## 2017-04-26 NOTE — Patient Instructions (Signed)

## 2017-04-26 NOTE — Patient Instructions (Addendum)
[facility] Discharge Instructions for Patients Receiving Chemotherapy  Today you received the following chemotherapy agents: Gemcitabine (Gemzar).  To help prevent nausea and vomiting after your treatment, we encourage you to take your nausea medication as prescribed.  If you develop nausea and vomiting that is not controlled by your nausea medication, call the clinic.   BELOW ARE SYMPTOMS THAT SHOULD BE REPORTED IMMEDIATELY:  *FEVER GREATER THAN 100.5 F  *CHILLS WITH OR WITHOUT FEVER  NAUSEA AND VOMITING THAT IS NOT CONTROLLED WITH YOUR NAUSEA MEDICATION  *UNUSUAL SHORTNESS OF BREATH  *UNUSUAL BRUISING OR BLEEDING  TENDERNESS IN MOUTH AND THROAT WITH OR WITHOUT PRESENCE OF ULCERS  *URINARY PROBLEMS  *BOWEL PROBLEMS  UNUSUAL RASH Items with * indicate a potential emergency and should be followed up as soon as possible.  Feel free to call the clinic should you have any questions or concerns. The clinic phone number is [phone].  Please show the [place] at check-in to the Emergency Department and triage nurse.   Dehydration, Adult Dehydration is when there is not enough fluid or water in your body. This happens when you lose more fluids than you take in. Dehydration can range from mild to very bad. It should be treated right away to keep it from getting very bad. Symptoms of mild dehydration may include:  Thirst.  Dry lips.  Slightly dry mouth.  Dry, warm skin.  Dizziness. Symptoms of moderate dehydration may include:  Very dry mouth.  Muscle cramps.  Dark pee (urine). Pee may be the color of tea.  Your body making less pee.  Your eyes making fewer tears.  Heartbeat that is uneven or faster than normal (palpitations).  Headache.  Light-headedness, especially when you stand up from sitting.  Fainting (syncope). Symptoms of very bad dehydration may include:  Changes in skin, such as: ? Cold and clammy skin. ? Blotchy  (mottled) or pale skin. ? Skin that does not quickly return to normal after being lightly pinched and let go (poor skin turgor).  Changes in body fluids, such as: ? Feeling very thirsty. ? Your eyes making fewer tears. ? Not sweating when body temperature is high, such as in hot weather. ? Your body making very little pee.  Changes in vital signs, such as: ? Weak pulse. ? Pulse that is more than 100 beats a minute when you are sitting still. ? Fast breathing. ? Low blood pressure.  Other changes, such as: ? Sunken eyes. ? Cold hands and feet. ? Confusion. ? Lack of energy (lethargy). ? Trouble waking up from sleep. ? Short-term weight loss. ? Unconsciousness. Follow these instructions at home:  If told by your doctor, drink an ORS: ? Make an ORS by using instructions on the package. ? Start by drinking small amounts, about  cup (120 mL) every 5-10 minutes. ? Slowly drink more until you have had the amount that your doctor said to have.  Drink enough clear fluid to keep your pee clear or pale yellow. If you were told to drink an ORS, finish the ORS first, then start slowly drinking clear fluids. Drink fluids such as: ? Water. Do not drink only water by itself. Doing that can make the salt (sodium) level in your body get too low (hyponatremia). ? Ice chips. ? Fruit juice that you have added water to (diluted). ? Low-calorie sports drinks.  Avoid: ? Alcohol. ? Drinks that have a lot of sugar. These include high-calorie sports drinks, fruit juice  that does not have water added, and soda. ? Caffeine. ? Foods that are greasy or have a lot of fat or sugar.  Take over-the-counter and prescription medicines only as told by your doctor.  Do not take salt tablets. Doing that can make the salt level in your body get too high (hypernatremia).  Eat foods that have minerals (electrolytes). Examples include bananas, oranges, potatoes, tomatoes, and spinach.  Keep all follow-up visits  as told by your doctor. This is important. Contact a doctor if:  You have belly (abdominal) pain that: ? Gets worse. ? Stays in one area (localizes).  You have a rash.  You have a stiff neck.  You get angry or annoyed more easily than normal (irritability).  You are more sleepy than normal.  You have a harder time waking up than normal.  You feel: ? Weak. ? Dizzy. ? Very thirsty.  You have peed (urinated) only a small amount of very dark pee during 6-8 hours. Get help right away if:  You have symptoms of very bad dehydration.  You cannot drink fluids without throwing up (vomiting).  Your symptoms get worse with treatment.  You have a fever.  You have a very bad headache.  You are throwing up or having watery poop (diarrhea) and it: ? Gets worse. ? Does not go away.  You have blood or something green (bile) in your throw-up.  You have blood in your poop (stool). This may cause poop to look black and tarry.  You have not peed in 6-8 hours.  You pass out (faint).  Your heart rate when you are sitting still is more than 100 beats a minute.  You have trouble breathing. This information is not intended to replace advice given to you by your health care provider. Make sure you discuss any questions you have with your health care provider. Document Released: 12/04/2008 Document Revised: 08/28/2015 Document Reviewed: 04/03/2015 Elsevier Interactive Patient Education  2018 [employer].

## 2017-04-27 ENCOUNTER — Encounter: Payer: Self-pay | Admitting: Nurse Practitioner

## 2017-04-27 LAB — CANCER ANTIGEN 19-9: CAN 19-9: 21 U/mL (ref 0–35)

## 2017-04-28 ENCOUNTER — Telehealth: Payer: Self-pay | Admitting: *Deleted

## 2017-04-28 DIAGNOSIS — R300 Dysuria: Secondary | ICD-10-CM

## 2017-04-28 DIAGNOSIS — N3090 Cystitis, unspecified without hematuria: Secondary | ICD-10-CM

## 2017-04-28 LAB — URINE CULTURE

## 2017-04-28 NOTE — Telephone Encounter (Signed)
Received call from [place], RN @ [facility] wanting to inform office re:  Pt never picked up script for Macrobid on 3/6 as instructed by Regan Rakers, NP.   Family informed [name] that pt will start med today since pt was not aware that she should start med 2 days ago. [name]'s     Phone       [phone].

## 2017-05-02 ENCOUNTER — Inpatient Hospital Stay: Payer: PPO

## 2017-05-02 ENCOUNTER — Inpatient Hospital Stay (HOSPITAL_BASED_OUTPATIENT_CLINIC_OR_DEPARTMENT_OTHER): Payer: PPO | Admitting: Medical

## 2017-05-02 ENCOUNTER — Telehealth: Payer: Self-pay | Admitting: *Deleted

## 2017-05-02 VITALS — BP 116/44 | HR 56 | Temp 99.0°F | Resp 17 | Ht 65.0 in

## 2017-05-02 DIAGNOSIS — J439 Emphysema, unspecified: Secondary | ICD-10-CM | POA: Diagnosis not present

## 2017-05-02 DIAGNOSIS — N390 Urinary tract infection, site not specified: Secondary | ICD-10-CM

## 2017-05-02 DIAGNOSIS — I10 Essential (primary) hypertension: Secondary | ICD-10-CM

## 2017-05-02 DIAGNOSIS — G2581 Restless legs syndrome: Secondary | ICD-10-CM

## 2017-05-02 DIAGNOSIS — M62830 Muscle spasm of back: Secondary | ICD-10-CM | POA: Diagnosis not present

## 2017-05-02 DIAGNOSIS — C250 Malignant neoplasm of head of pancreas: Secondary | ICD-10-CM

## 2017-05-02 DIAGNOSIS — J90 Pleural effusion, not elsewhere classified: Secondary | ICD-10-CM

## 2017-05-02 DIAGNOSIS — Z5111 Encounter for antineoplastic chemotherapy: Secondary | ICD-10-CM | POA: Diagnosis not present

## 2017-05-02 DIAGNOSIS — I2510 Atherosclerotic heart disease of native coronary artery without angina pectoris: Secondary | ICD-10-CM | POA: Diagnosis not present

## 2017-05-02 DIAGNOSIS — G4730 Sleep apnea, unspecified: Secondary | ICD-10-CM

## 2017-05-02 DIAGNOSIS — Z923 Personal history of irradiation: Secondary | ICD-10-CM

## 2017-05-02 DIAGNOSIS — I878 Other specified disorders of veins: Secondary | ICD-10-CM | POA: Diagnosis not present

## 2017-05-02 DIAGNOSIS — I700 Atherosclerosis of aorta: Secondary | ICD-10-CM | POA: Diagnosis not present

## 2017-05-02 DIAGNOSIS — I5030 Unspecified diastolic (congestive) heart failure: Secondary | ICD-10-CM | POA: Diagnosis not present

## 2017-05-02 DIAGNOSIS — M5136 Other intervertebral disc degeneration, lumbar region: Secondary | ICD-10-CM

## 2017-05-02 DIAGNOSIS — Z7982 Long term (current) use of aspirin: Secondary | ICD-10-CM

## 2017-05-02 DIAGNOSIS — Z794 Long term (current) use of insulin: Secondary | ICD-10-CM

## 2017-05-02 DIAGNOSIS — E1140 Type 2 diabetes mellitus with diabetic neuropathy, unspecified: Secondary | ICD-10-CM

## 2017-05-02 DIAGNOSIS — Z9981 Dependence on supplemental oxygen: Secondary | ICD-10-CM

## 2017-05-02 DIAGNOSIS — E46 Unspecified protein-calorie malnutrition: Secondary | ICD-10-CM | POA: Diagnosis not present

## 2017-05-02 DIAGNOSIS — I872 Venous insufficiency (chronic) (peripheral): Secondary | ICD-10-CM

## 2017-05-02 DIAGNOSIS — R634 Abnormal weight loss: Secondary | ICD-10-CM

## 2017-05-02 DIAGNOSIS — Z79899 Other long term (current) drug therapy: Secondary | ICD-10-CM

## 2017-05-02 DIAGNOSIS — K589 Irritable bowel syndrome without diarrhea: Secondary | ICD-10-CM

## 2017-05-02 DIAGNOSIS — E785 Hyperlipidemia, unspecified: Secondary | ICD-10-CM

## 2017-05-02 LAB — COMPREHENSIVE METABOLIC PANEL
ALT: 37 U/L (ref 0–55)
AST: 80 U/L — ABNORMAL HIGH (ref 5–34)
Albumin: 2.4 g/dL — ABNORMAL LOW (ref 3.5–5.0)
Alkaline Phosphatase: 342 U/L — ABNORMAL HIGH (ref 40–150)
Anion gap: 10 (ref 3–11)
BUN: 25 mg/dL (ref 7–26)
CALCIUM: 9.5 mg/dL (ref 8.4–10.4)
CO2: 29 mmol/L (ref 22–29)
CREATININE: 0.87 mg/dL (ref 0.60–1.10)
Chloride: 103 mmol/L (ref 98–109)
Glucose, Bld: 103 mg/dL (ref 70–140)
Potassium: 4.1 mmol/L (ref 3.5–5.1)
Sodium: 142 mmol/L (ref 136–145)
Total Bilirubin: 0.7 mg/dL (ref 0.2–1.2)
Total Protein: 6.7 g/dL (ref 6.4–8.3)

## 2017-05-02 LAB — CBC WITH DIFFERENTIAL/PLATELET
BASOS ABS: 0 10*3/uL (ref 0.0–0.1)
BASOS PCT: 0 %
EOS ABS: 0 10*3/uL (ref 0.0–0.5)
Eosinophils Relative: 0 %
HCT: 26.5 % — ABNORMAL LOW (ref 34.8–46.6)
HEMOGLOBIN: 8.4 g/dL — AB (ref 11.6–15.9)
Lymphocytes Relative: 33 %
Lymphs Abs: 0.9 10*3/uL (ref 0.9–3.3)
MCH: 26.7 pg (ref 25.1–34.0)
MCHC: 31.8 g/dL (ref 31.5–36.0)
MCV: 83.9 fL (ref 79.5–101.0)
Monocytes Absolute: 0.3 10*3/uL (ref 0.1–0.9)
Monocytes Relative: 9 %
NEUTROS PCT: 58 %
Neutro Abs: 1.7 10*3/uL (ref 1.5–6.5)
Platelets: 178 10*3/uL (ref 145–400)
RBC: 3.15 MIL/uL — ABNORMAL LOW (ref 3.70–5.45)
RDW: 22.6 % — ABNORMAL HIGH (ref 11.2–14.5)
WBC: 2.9 10*3/uL — ABNORMAL LOW (ref 3.9–10.3)

## 2017-05-02 MED ORDER — BACLOFEN 10 MG PO TABS: 5.0000 mg | ORAL_TABLET | Freq: Three times a day (TID) | ORAL | 0 refills | Status: AC

## 2017-05-02 NOTE — Progress Notes (Signed)
[facility]pt[facility] Clinic Progress Note   Melissa Myers [id number] [birth year] 72 y.o.  Melissa Myers is managed by Dr. Truitt Merle  Actively treated with chemotherapy: yes  Current Therapy: Single agent gemcitabine  Last Treated:  04/26/2017 (cycle 6, day 1)  Assessment: Plan:    Muscle spasm of back - Plan: baclofen (LIORESAL) 10 MG tablet  Urinary tract infection without hematuria, site unspecified  Malignant neoplasm of head of pancreas (HCC)   Muscle spasms of the right mid back: Patient was given prescription for baclofen 10 mg p.o. 3 times daily as needed for muscle spasms.  Additionally she was told that she could use heat to the area, 10 minutes on, and 10 minutes off.  Urinary tract infection: Patient was instructed to continue using Macrobid as prescribed until completed.    Malignant neoplasm of the head of the pancreas: Patient is status post cycle 6, day 1 of single agent gemcitabine.  She is scheduled to return to see Dr. Morey Hummingbird in follow-up tomorrow.  Please see After Visit Summary for patient specific instructions.  Future Appointments  Date Time Provider [place]  05/03/2017  1:00 PM [facility] INJ NURSE [facility] None  05/03/2017  1:30 PM Alla Feeling, NP [facility] None  05/03/2017  2:00 PM [facility] 2 [facility] [place]  05/03/2017  3:00 PM [facility] PROCEDURE 1 [facility] None  05/10/2017  3:00 PM [facility] PA/NP [facility] None  05/11/2017 10:15 AM [facility] LAB 4 [facility] None  05/11/2017 10:30 AM [facility] FLUSH NURSE [facility] None  05/11/2017 11:15 AM Truitt Merle, MD [facility] None  05/11/2017 11:45 AM [facility] D11 [facility] None  05/18/2017 10:45 AM [facility] LAB 4 [facility] None  05/18/2017 11:00 AM [facility] FLUSH NURSE 2 [facility] None  05/18/2017 11:30 AM Alla Feeling, NP [facility] None  05/18/2017 12:30 PM [facility] C8 [facility] None  05/30/2017  1:00 PM Tana Felts [facility] None  05/30/2017  2:00  PM [facility] LAB 3 [facility] None  07/11/2017  3:20 PM Larey Dresser, MD [facility] None    No orders of the defined types were placed in this encounter.      Subjective:   Patient ID:  Melissa Myers is a 72 y.o. (DOB October 10, 1945) female.  Chief Complaint:  Chief Complaint  Patient presents with  . Back Pain    HPI Melissa Myers is a 72 year old female with a history of a malignant neoplasm of the head of the pancreas.  She is managed by Dr. Truitt Merle and has recently he is treated with single agent gemcitabine given for 2 out of every 3 weeks.  She was last seen on 04/26/2017 and was treated at that time.  She is currently being treated with Macrobid for a urinary tract infection with E. coli.  Her office was contacted today by the care coordination nurse that reported the patient was having severe back pain from her mid back to her right leg.  She reports that her pain developed acutely approximately 2 days ago when she reached up into her cupboard to get something out.  She felt a twinge in her right back.  The pain is radiating from her right lower back through her buttock to her right posterior thigh.  The patient has diabetes and reports that her blood glucose ranged from 200-300 each day when she checks it.  She also reports having a history of episodic constipation which is not uncommon for her.  She has not had a bowel movement in  2-3 days.  Patient been taking oxycodone and tramadol without improvement in her pain.  She is not using oxygen today.  Her oxygen saturation is at 100% on room air.  Medications: I have reviewed the patient's current medications.  Allergies:  Allergies  Allergen Reactions  . Morphine Other (See Comments) and Nausea Only    GI upset and headaches GI upset and headaches  . Statins Other (See Comments)    Statin drugs cause muscle pain / "muscle damage"--was told by MD not to take rhabdomyolysis Statin drugs cause muscle pain / "muscle  damage"--was told by MD not to take  . Sulfa Antibiotics Diarrhea and Nausea Only  . Cymbalta [Duloxetine Hcl] Other (See Comments)    Restless leg syndrome Restless leg syndrome, rash, itching, and diarrhea  . Sulfasalazine Diarrhea  . Levemir [Insulin Detemir] Itching  . Zinc Swelling, Rash, Hives and Other (See Comments)    Past Medical History:  Diagnosis Date  . Anemia   . Cancer [facility])    Pancreatic  . Cellulitis    LOWER EXTREMITIES  . Chronic diarrhea    a/w nausea - felt related to IBS  . Deaf    left side only  . Diastolic CHF ([place])   . Disc degeneration, lumbar   . GERD (gastroesophageal reflux disease)   . Hyperlipidemia    hx rhabdo on statins  . Hypertension   . Neuropathy    feet, toes and fingers  . On home oxygen therapy    uses oxygen 2 liters min per [place] at night and prn during day  . OSA (obstructive sleep apnea)    05/2009 sleep study - refuses CPAP  . Osteoarthritis   . RLS (restless legs syndrome)   . Shortness of breath    chronic  . Stasis dermatitis   . Type II or unspecified type diabetes mellitus without mention of complication, not stated as uncontrolled    insulin dep    Past Surgical History:  Procedure Laterality Date  . BILIARY STENT PLACEMENT N/A 05/06/2016   Procedure: BILIARY STENT PLACEMENT;  Surgeon: Gatha Mayer, MD;  Location: [facility] [place];  Service: Endoscopy;  Laterality: N/A;  . CHOLECYSTECTOMY  1997  . COLONOSCOPY N/A 12/03/2012   Procedure: COLONOSCOPY;  Surgeon: Lafayette Dragon, MD;  [facility];  Service: Endoscopy;  Laterality: N/A;  . ENDOSCOPIC RETROGRADE CHOLANGIOPANCREATOGRAPHY (ERCP) WITH PROPOFOL N/A 05/06/2016   Procedure: ENDOSCOPIC RETROGRADE CHOLANGIOPANCREATOGRAPHY (ERCP) WITH PROPOFOL;  Surgeon: Gatha Mayer, MD;  Location: [facility];  Service: Endoscopy;  Laterality: N/A;  . ERCP N/A 03/18/2016   Procedure: ENDOSCOPIC RETROGRADE CHOLANGIOPANCREATOGRAPHY (ERCP);  Surgeon: Irene Shipper, MD;   Location: [facility];  Service: Endoscopy;  Laterality: N/A;  . EUS N/A 04/28/2016   Procedure: UPPER ENDOSCOPIC ULTRASOUND (EUS) LINEAR;  Surgeon: [name], MD;  [facility];  Service: Endoscopy;  Laterality: N/A;  . IR CHOLANGIOGRAM EXISTING TUBE  06/09/2016  . IR GENERIC HISTORICAL  05/07/2016   IR BILIARY DRAIN PLACEMENT WITH CHOLANGIOGRAM 05/07/2016 Jacqulynn Cadet, MD [facility]  . IR GENERIC HISTORICAL  05/19/2016   IR BILIARY STENT(S) EXISTING ACCESS INC DILATION CATH EXCHANGE 05/19/2016 Jacqulynn Cadet, MD [facility]  . TONSILLECTOMY  1970  . TUBAL LIGATION  1980  . UMBILICAL HERNIA REPAIR  1995  . uterine polyp removal  2008    Family History  Problem Relation Age of Onset  . Heart disease Mother   . Heart attack Mother 46  . Heart  disease Father   . Heart attack Father 12  . Heart disease Unknown        family history  . Stomach cancer Paternal Grandmother   . Lung cancer Paternal Grandfather   . CVA Unknown        several aunts  . Heart attack Unknown        several aunts and an uncle  . Colon cancer Neg Hx     Social History   Socioeconomic History  . Marital status: Married    Spouse name: Not on file  . Number of children: Not on file  . Years of education: Not on file  . Highest education level: Not on file  Social Needs  . Financial resource strain: Not on file  . Food insecurity - worry: Not on file  . Food insecurity - inability: Not on file  . Transportation needs - medical: Not on file  . Transportation needs - non-medical: Not on file  Occupational History  . Not on file  Tobacco Use  . Smoking status: Never Smoker  . Smokeless tobacco: Never Used  Substance and Sexual Activity  . Alcohol use: No  . Drug use: No  . Sexual activity: No  Other Topics Concern  . Not on file  Social History Narrative   Lives with spouse and mother. Retired Network engineer, now housewife    Past Medical History, Surgical history, Social  history, and Family history were reviewed and updated as appropriate.   Please see review of systems for further details on the patient's review from today.   Review of Systems:  Review of Systems  Constitutional: Negative for chills, diaphoresis and fever.  Respiratory: Negative for cough, choking, chest tightness, shortness of breath and wheezing.   Cardiovascular: Positive for leg swelling. Negative for chest pain and palpitations.  Gastrointestinal: Positive for constipation. Negative for diarrhea, nausea and vomiting.  Musculoskeletal: Positive for back pain and gait problem.    Objective:   Physical Exam:  BP (!) 116/44 (BP Location: Left Arm, Patient Position: Sitting) Comment: told rn beth  Pulse (!) 56   Temp 99 F (37.2 C) (Oral)   Resp 17   Ht 5\' 5"  (1.651 m)   SpO2 100%   BMI 35.38 kg/m  ECOG: 1  Physical Exam  Constitutional: No distress.  The patient is an elderly chronically ill-appearing female who is seated in a wheelchair and who appears to be in no apparent distress.  HENT:  Head: Normocephalic and atraumatic.  Cardiovascular: Normal rate, regular rhythm and normal heart sounds. Exam reveals no gallop and no friction rub.  No murmur heard. Pulmonary/Chest: Effort normal and breath sounds normal. No respiratory distress. She has no wheezes. She has no rales.  Musculoskeletal: She exhibits edema (Bilateral lower extremity edema to the knees.  Stasis dermatitis is noted over the bilateral lower extremities.) and tenderness (There is tenderness to palpation along an area of muscle spasm in the right posterior mid back.  Additionally the patient expresses pain when I touch her lower extremities.  This is not new.).  Neurological: Coordination (The patient is ambulating with the use of a wheelchair.) abnormal.  Skin: Skin is warm and dry. She is not diaphoretic.  Stasis dermatitis is noted over the bilateral lower extremities.    Lab Review:     Component Value  Date/Time   NA 142 05/02/2017 1413   NA 139 02/22/2017 1300   K 4.1 05/02/2017 1413   K 4.0 02/22/2017 1300  CL 103 05/02/2017 1413   CO2 29 05/02/2017 1413   CO2 26 02/22/2017 1300   GLUCOSE 103 05/02/2017 1413   GLUCOSE 198 (H) 02/22/2017 1300   BUN 25 05/02/2017 1413   BUN 19.9 02/22/2017 1300   CREATININE 0.87 05/02/2017 1413   CREATININE 0.9 02/22/2017 1300   CALCIUM 9.5 05/02/2017 1413   CALCIUM 9.3 02/22/2017 1300   PROT 6.7 05/02/2017 1413   PROT 7.1 02/22/2017 1300   ALBUMIN 2.4 (L) 05/02/2017 1413   ALBUMIN 3.0 (L) 02/22/2017 1300   AST 80 (H) 05/02/2017 1413   AST 17 02/22/2017 1300   ALT 37 05/02/2017 1413   ALT 19 02/22/2017 1300   ALKPHOS 342 (H) 05/02/2017 1413   ALKPHOS 250 (H) 02/22/2017 1300   BILITOT 0.7 05/02/2017 1413   BILITOT 0.31 02/22/2017 1300   GFRNONAA >60 05/02/2017 1413   GFRAA >60 05/02/2017 1413       Component Value Date/Time   WBC 2.9 (L) 05/02/2017 1413   RBC 3.15 (L) 05/02/2017 1413   HGB 8.4 (L) 05/02/2017 1413   HGB 9.6 (L) 02/22/2017 1300   HCT 26.5 (L) 05/02/2017 1413   HCT 31.5 (L) 02/22/2017 1300   PLT 178 05/02/2017 1413   PLT 337 02/22/2017 1300   MCV 83.9 05/02/2017 1413   MCV 78.8 (L) 02/22/2017 1300   MCH 26.7 05/02/2017 1413   MCHC 31.8 05/02/2017 1413   RDW 22.6 (H) 05/02/2017 1413   RDW 16.9 (H) 02/22/2017 1300   LYMPHSABS 0.9 05/02/2017 1413   LYMPHSABS 2.4 02/22/2017 1300   MONOABS 0.3 05/02/2017 1413   MONOABS 0.5 02/22/2017 1300   EOSABS 0.0 05/02/2017 1413   EOSABS 0.1 02/22/2017 1300   BASOSABS 0.0 05/02/2017 1413   BASOSABS 0.0 02/22/2017 1300   -------------------------------  Imaging from last 24 hours (if applicable):  Radiology interpretation: No results found.

## 2017-05-02 NOTE — Telephone Encounter (Signed)
Received call from [place], [place] reporting that pt has severe back pain -  From Mid back to Right side down to Right leg.    Called pt at home and spoke with husband.  Per spouse, Oxycodone and Tramadol  " Did not even touch her pain ".   Lucianne Lei, [place] notified.   Instructed husband to bring pt in  NOW to see [place]   Husband voiced understanding.

## 2017-05-02 NOTE — Progress Notes (Signed)
Pt states that she was reaching into cupboard on Saturday and experienced back that has continues today and radiates down her right buttock and right leg

## 2017-05-03 ENCOUNTER — Inpatient Hospital Stay: Payer: PPO

## 2017-05-03 ENCOUNTER — Ambulatory Visit (HOSPITAL_COMMUNITY)
Admission: RE | Admit: 2017-05-03 | Discharge: 2017-05-03 | Disposition: A | Discharge status: Home / Self Care | Payer: PPO | Source: Ambulatory Visit | Attending: Hematology | Admitting: Hematology

## 2017-05-03 ENCOUNTER — Other Ambulatory Visit: Payer: PPO

## 2017-05-03 ENCOUNTER — Inpatient Hospital Stay (HOSPITAL_BASED_OUTPATIENT_CLINIC_OR_DEPARTMENT_OTHER): Payer: PPO | Admitting: Nurse Practitioner

## 2017-05-03 VITALS — BP 129/44 | HR 50 | Temp 98.0°F | Resp 16 | Ht 65.0 in | Wt 203.4 lb

## 2017-05-03 VITALS — BP 152/68

## 2017-05-03 DIAGNOSIS — I5030 Unspecified diastolic (congestive) heart failure: Secondary | ICD-10-CM | POA: Diagnosis not present

## 2017-05-03 DIAGNOSIS — M5136 Other intervertebral disc degeneration, lumbar region: Secondary | ICD-10-CM

## 2017-05-03 DIAGNOSIS — M62830 Muscle spasm of back: Secondary | ICD-10-CM | POA: Diagnosis not present

## 2017-05-03 DIAGNOSIS — C250 Malignant neoplasm of head of pancreas: Secondary | ICD-10-CM | POA: Diagnosis not present

## 2017-05-03 DIAGNOSIS — E46 Unspecified protein-calorie malnutrition: Secondary | ICD-10-CM

## 2017-05-03 DIAGNOSIS — L03116 Cellulitis of left lower limb: Secondary | ICD-10-CM

## 2017-05-03 DIAGNOSIS — I517 Cardiomegaly: Secondary | ICD-10-CM | POA: Diagnosis not present

## 2017-05-03 DIAGNOSIS — R609 Edema, unspecified: Secondary | ICD-10-CM | POA: Diagnosis not present

## 2017-05-03 DIAGNOSIS — E860 Dehydration: Secondary | ICD-10-CM

## 2017-05-03 DIAGNOSIS — Z5111 Encounter for antineoplastic chemotherapy: Secondary | ICD-10-CM

## 2017-05-03 DIAGNOSIS — I700 Atherosclerosis of aorta: Secondary | ICD-10-CM

## 2017-05-03 DIAGNOSIS — E1140 Type 2 diabetes mellitus with diabetic neuropathy, unspecified: Secondary | ICD-10-CM

## 2017-05-03 DIAGNOSIS — Z95828 Presence of other vascular implants and grafts: Secondary | ICD-10-CM

## 2017-05-03 DIAGNOSIS — R410 Disorientation, unspecified: Secondary | ICD-10-CM

## 2017-05-03 DIAGNOSIS — J90 Pleural effusion, not elsewhere classified: Secondary | ICD-10-CM

## 2017-05-03 DIAGNOSIS — Z79899 Other long term (current) drug therapy: Secondary | ICD-10-CM

## 2017-05-03 DIAGNOSIS — R5383 Other fatigue: Secondary | ICD-10-CM | POA: Diagnosis not present

## 2017-05-03 DIAGNOSIS — M549 Dorsalgia, unspecified: Secondary | ICD-10-CM

## 2017-05-03 DIAGNOSIS — Z7982 Long term (current) use of aspirin: Secondary | ICD-10-CM

## 2017-05-03 DIAGNOSIS — L03115 Cellulitis of right lower limb: Secondary | ICD-10-CM

## 2017-05-03 DIAGNOSIS — N390 Urinary tract infection, site not specified: Secondary | ICD-10-CM | POA: Diagnosis not present

## 2017-05-03 DIAGNOSIS — K589 Irritable bowel syndrome without diarrhea: Secondary | ICD-10-CM

## 2017-05-03 DIAGNOSIS — J439 Emphysema, unspecified: Secondary | ICD-10-CM | POA: Insufficient documentation

## 2017-05-03 DIAGNOSIS — I878 Other specified disorders of veins: Secondary | ICD-10-CM

## 2017-05-03 DIAGNOSIS — E785 Hyperlipidemia, unspecified: Secondary | ICD-10-CM

## 2017-05-03 DIAGNOSIS — M47816 Spondylosis without myelopathy or radiculopathy, lumbar region: Secondary | ICD-10-CM | POA: Diagnosis not present

## 2017-05-03 DIAGNOSIS — I2510 Atherosclerotic heart disease of native coronary artery without angina pectoris: Secondary | ICD-10-CM | POA: Diagnosis not present

## 2017-05-03 DIAGNOSIS — Z794 Long term (current) use of insulin: Secondary | ICD-10-CM

## 2017-05-03 DIAGNOSIS — Z923 Personal history of irradiation: Secondary | ICD-10-CM

## 2017-05-03 DIAGNOSIS — D6481 Anemia due to antineoplastic chemotherapy: Secondary | ICD-10-CM | POA: Diagnosis not present

## 2017-05-03 DIAGNOSIS — R634 Abnormal weight loss: Secondary | ICD-10-CM

## 2017-05-03 DIAGNOSIS — I872 Venous insufficiency (chronic) (peripheral): Secondary | ICD-10-CM

## 2017-05-03 DIAGNOSIS — C259 Malignant neoplasm of pancreas, unspecified: Secondary | ICD-10-CM | POA: Diagnosis not present

## 2017-05-03 DIAGNOSIS — G4730 Sleep apnea, unspecified: Secondary | ICD-10-CM

## 2017-05-03 DIAGNOSIS — I10 Essential (primary) hypertension: Secondary | ICD-10-CM

## 2017-05-03 DIAGNOSIS — G2581 Restless legs syndrome: Secondary | ICD-10-CM

## 2017-05-03 DIAGNOSIS — Z9981 Dependence on supplemental oxygen: Secondary | ICD-10-CM

## 2017-05-03 LAB — CBC WITH DIFFERENTIAL/PLATELET
BASOS ABS: 0 10*3/uL (ref 0.0–0.1)
Basophils Relative: 0 %
EOS PCT: 0 %
Eosinophils Absolute: 0 10*3/uL (ref 0.0–0.5)
HCT: 27 % — ABNORMAL LOW (ref 34.8–46.6)
HEMOGLOBIN: 8.5 g/dL — AB (ref 11.6–15.9)
LYMPHS ABS: 1.3 10*3/uL (ref 0.9–3.3)
Lymphocytes Relative: 30 %
MCH: 27 pg (ref 25.1–34.0)
MCHC: 31.5 g/dL (ref 31.5–36.0)
MCV: 85.7 fL (ref 79.5–101.0)
Monocytes Absolute: 0.4 10*3/uL (ref 0.1–0.9)
Monocytes Relative: 10 %
NEUTROS PCT: 60 %
NRBC: 2 /100{WBCs} — AB
Neutro Abs: 2.5 10*3/uL (ref 1.5–6.5)
Platelets: 154 10*3/uL (ref 145–400)
RBC: 3.15 MIL/uL — AB (ref 3.70–5.45)
RDW: 21.4 % — ABNORMAL HIGH (ref 11.2–14.5)
WBC: 4.2 10*3/uL (ref 3.9–10.3)

## 2017-05-03 LAB — COMPREHENSIVE METABOLIC PANEL
ALT: 39 U/L (ref 0–55)
AST: 82 U/L — AB (ref 5–34)
Albumin: 2.4 g/dL — ABNORMAL LOW (ref 3.5–5.0)
Alkaline Phosphatase: 336 U/L — ABNORMAL HIGH (ref 40–150)
Anion gap: 9 (ref 3–11)
BUN: 18 mg/dL (ref 7–26)
CHLORIDE: 101 mmol/L (ref 98–109)
CO2: 30 mmol/L — ABNORMAL HIGH (ref 22–29)
Calcium: 9.4 mg/dL (ref 8.4–10.4)
Creatinine, Ser: 0.83 mg/dL (ref 0.60–1.10)
GFR calc Af Amer: >60 mL/min (ref >60)
Glucose, Bld: 190 mg/dL — ABNORMAL HIGH (ref 70–140)
POTASSIUM: 4.2 mmol/L (ref 3.5–5.1)
Sodium: 140 mmol/L (ref 136–145)
Total Bilirubin: 0.7 mg/dL (ref 0.2–1.2)
Total Protein: 6.7 g/dL (ref 6.4–8.3)

## 2017-05-03 LAB — CANCER ANTIGEN 19-9: CAN 19-9: 18 U/mL (ref 0–35)

## 2017-05-03 MED ORDER — HEPARIN SOD (PORK) LOCK FLUSH 100 UNIT/ML IV SOLN
500.0000 [IU] | Freq: Once | INTRAVENOUS | Status: AC | PRN
  Administered 2017-05-03: 500 [IU] via INTRAVENOUS
  Filled 2017-05-03: qty 5

## 2017-05-03 MED ORDER — SODIUM CHLORIDE 0.9% FLUSH
10.0000 mL | INTRAVENOUS | Status: DC | PRN
  Administered 2017-05-03: 10 mL via INTRAVENOUS
  Filled 2017-05-03: qty 10

## 2017-05-03 MED ORDER — SODIUM CHLORIDE 0.9 % IV SOLN
Freq: Once | INTRAVENOUS | Status: AC
  Administered 2017-05-03: 16:00:00 via INTRAVENOUS

## 2017-05-03 MED ORDER — SODIUM CHLORIDE 0.9 % IJ SOLN
10.0000 mL | Freq: Once | INTRAMUSCULAR | Status: AC
  Administered 2017-05-03: 10 mL
  Filled 2017-05-03: qty 10

## 2017-05-03 MED ORDER — IOPAMIDOL (ISOVUE-300) INJECTION 61%
INTRAVENOUS | Status: AC
  Administered 2017-05-03: 100 mL
  Filled 2017-05-03: qty 100

## 2017-05-03 MED ORDER — HEPARIN SOD (PORK) LOCK FLUSH 100 UNIT/ML IV SOLN
INTRAVENOUS | Status: AC
  Filled 2017-05-03: qty 5

## 2017-05-03 MED ORDER — SODIUM CHLORIDE 0.9 % IJ SOLN
INTRAMUSCULAR | Status: AC
  Filled 2017-05-03: qty 50

## 2017-05-03 MED ORDER — IOPAMIDOL (ISOVUE-300) INJECTION 61%: 100.0000 mL | Freq: Once | INTRAVENOUS | Status: DC | PRN

## 2017-05-03 MED ORDER — HEPARIN SOD (PORK) LOCK FLUSH 100 UNIT/ML IV SOLN
500.0000 [IU] | Freq: Once | INTRAVENOUS | Status: AC
  Administered 2017-05-03: 500 [IU] via INTRAVENOUS

## 2017-05-03 NOTE — Progress Notes (Addendum)
[place]  [phone] Fax:([phone]) [phone]  Clinic Follow up Note   Patient Care Team: [name], MD as PCP - General (Internal Medicine) Rigoberto Noel, MD as Consulting Physician (Pulmonary Disease) Larey Dresser, MD as Consulting Physician (Cardiology) Megan Salon, MD as Consulting Physician (Gynecology) Kristeen Miss, MD as Consulting Physician (Neurosurgery) Earlie Server, MD (Orthopedic Surgery) Almedia Balls, MD (Orthopedic Surgery) Lafayette Dragon, MD (Inactive) (Gastroenterology) Renato Shin, MD (Endocrinology) Darleen Crocker, MD (Ophthalmology)  Date of service: 05/03/17  SUMMARY OF ONCOLOGIC HISTORY: Oncology History   Cancer Staging Malignant neoplasm of head of pancreas [facility]) Staging form: Exocrine Pancreas, AJCC 8th Edition - Clinical stage from 04/28/2016: Stage IB (cT2, cN0, cM0) - Signed by Truitt Merle, MD on 02/01/2017       Malignant neoplasm of head of pancreas ([place])   02/2016 Initial Diagnosis    Malignant neoplasm of head of pancreas ([place])      03/15/2016 - 03/21/2016 Hospital Admission    Presents with nausea, vomiting, and jaundice   Patient was found hyperbilirubinemic with abnormal abdominal CT suggesting obstructive physiology. Referred for admission and evaluation.      03/15/2016 Imaging    CT Abdomen Pelvis w Contrast 1. Cholecystectomy with borderline biliary duct dilatation. 2. Pancreatic atrophy with moderate pancreatic duct dilatation, followed to the level of the duodenum. 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 Considerations include otherwise occult stricture, periampullary lesion, or main duct intraductal papillary mucinous neoplasm. Given the combination of borderline biliary duct dilatation, moderate pancreatic duct dilatation, and the clinical history of jaundice, consider further evaluation with ERCP. If the patient is not a good ERCP candidate, MRCP with and without contrast would be  another option. 3.  No acute process in the abdomen or pelvis. 4.  Tiny hiatal hernia. 5.  Aortic atherosclerosis.      03/16/2016 Imaging    MRI Abdomen 1. Hypoenhancing lesion in the uncinate process of the pancreas, better demonstrated by MR than on the recent CT scan. The dilated main pancreatic duct and dilated common bile duct abruptly terminate at the cranial margin of this lesion. MR imaging features are concerning for pancreatic adenocarcinoma. ERCP with endoscopic ultrasound would likely prove helpful to further evaluate. 2. No definite metastatic disease in the liver. No peripancreatic lymphadenopathy is identified. Of note, this exam is limited by marked motion degradation and small liver lesions could be obscured. CT scan earlier today demonstrated no peripancreatic lymphadenopathy.      03/18/2016 Procedure    ERCP 1. Malignant-appearing distal bile duct stricturestatus post ERCP with sphincterotomy and plastic biliary stent placement 2. Guidewire finding false lumen with subsequentinjection as described 3. Status post cholecystectomy  Recommendation: 1. Clear liquid diet 2. Unasyn 1.5 g every 6 hours 3. Standard post ERCP observation. Indomethacin suppositories given 4. Trend LFTs 5. Will need endoscopic ultrasound with biopsy with Dr. Ardis Hughs outpatient.      04/28/2016 Pathology Results    Endoscopic [place] and Biopsy FINE NEEDLE ASPIRATION, ENDOSCOPIC, PANCREAS UNCINATE (SPECIMEN 1 OF 1 COLLECTED 04/28/16): MALIGNANT CELLS CONSISTENT WITH ADENOCARCINOMA.      05/04/2016 -  Hospital Admission    Presents with fever, abdominal pain, malaise, confusion. She was treated for acute cholangitis.       05/05/2016 Imaging    CT Abdomen Pelvis w Contrast Interval placement of a bile duct stent with some decompression of the bile ducts. Mild residual biliary dilatation. Pancreatic ductal dilatation and atrophy. Known pancreatic mass lesion is not well  depicted at CT. No  evidence of bowel obstruction or inflammation.      05/05/2016 Imaging    CT Head w/o Contrast 05/05/16 IMPRESSION: No acute intracranial pathology.      05/06/2016 Procedure    ERCP and Bilary stent placement  Imaging demonstrating removal of endoscopic common bile duct stent. The stent was not able to be replaced.       05/07/2016 Imaging    CT Chest wo Contrast  1. New small right and trace left pleural effusions. Mild cardiomegaly. 2. Borderline enlarged right hilar lymph node at 1 cm in short axis. 3. No definite findings of metastatic disease to the chest. 4. Centrilobular emphysema. 5. High density in the visualized part of the dorsal pancreatic duct, probably contrast medium. 6. Subacute healing fracture of the right seventh rib laterally. 7. Atherosclerosis, including the left anterior descending coronary artery.      05/07/2016 Procedure    On 05/07/16, percutaneous transhepatic cholangiogram and placement of a 10F internal/external biliary drain was performed by IR with aspirated purulent bile sent for culture. Bile culture grew pantoea species, enterococcus faecalis, viridans streptococcus.      07/11/2016 - 07/21/2016 Radiation Therapy    Diagnosis:   cT2N0M0  adenocarcinoma of the pancreas  Indication for treatment:  Curative  Radiation treatment dates:   07/11/16 - 07/21/16  Site/dose:   Pancreas: 33 Gy in 5 fractions  Beams/energy:   SBRT/SRT-VMAT // 10X-FFF Photon  Narrative: The patient tolerated radiation treatment relatively well. The patient complained of diarrhea during treatment. She was instructed to purchase Imodium.      08/2016 - 10/2016 Chemotherapy    She had chemotherapy consistent of gemcitabine and abraxine for 2-3 months under the care of Dr. Berneice Gandy at [facility] with her last treatment being about 10/2016.        02/20/2017 Imaging    CT CAP 02/20/17  IMPRESSION: 1. Response to therapy of the primary pancreatic head/uncinate process lesion.  Please note that delineation of the tumor is difficult secondary to anatomic distortion from therapy. 2. No new or progressive disease identified. 3. Removal of biliary stent, with pneumobilia and borderline similar intrahepatic biliary duct dilatation. 4.  No acute process or evidence of metastatic disease in the chest. 5.  Possible constipation. 6. Coronary artery atherosclerosis. Aortic Atherosclerosis (ICD10-I70.0). 7.  Emphysema (ICD10-J43.9). 8. Pulmonary artery enlargement suggests pulmonary arterial hypertension.      02/22/2017 -  Chemotherapy    Single agent Gemcitabine every 2-3 weeks on and 1 week off, starting 02/22/17       05/03/2017 Imaging    IMPRESSION: 1. Subjectively the pancreatic head and uncinate process mass appears similar to the prior exam. The lesion is associated with about 50% circumferential involvement of the SMA and SMV. No compelling findings of distant metastatic spread at this time. 2. Mild biliary dilatation extending down to the pancreatic head mass. Mild pneumobilia. 3. There is mildly increased infiltrative edema along the porta hepatis. 4. Other imaging findings of potential clinical significance: Prominent stool throughout the colon favors constipation. Aortic Atherosclerosis (ICD10-I70.0). Coronary atherosclerosis. Mitral valve calcification. Mild cardiomegaly. Free osteochondral fragments in the right glenohumeral joint. Lower lumbar spondylosis and degenerative disc disease. 2 stable nonspecific hypodense splenic lesions. Emphysema (ICD10-J43.9).     CURRENT THERAPY: single agent gemcitabine 2 weeks on and 1 week off starting 02/22/17  INTERVAL HISTORY: Melissa Myers returns for follow up as scheduled following CT scan. She presents with her part-time caregiver, asleep in her wheelchair.  She arouses to her name and touch. She received dose reduced gemzar and IVF on 04/26/17. Per her caregiver, she had a tough time this week and feels she has  declined some. Has had significant fatigue. Did not eat or drink well, only voided once today. Did start macrobid for UTI; arousable to answer some questions and denies further dysuria. Mild nausea and constipation. BG was 90 this morning so she did not get mealtime insulin. She required oxygen intermittently at home. Has not slept well. Had an episode over the weekend she pulled a muscle in her back while reaching for something and required [facility] visit 3/12. She received decadron and a prescription for baclofen 5 mg TID. Caregiver reports 1/2 dose baclofen plus oxycodone "did not touch her pain" so she was given full 10 mg dose baclofen around 12:30 today, has been very drowsy ever since.     MEDICAL HISTORY:  Past Medical History:  Diagnosis Date  . Anemia   . Cancer [facility])    Pancreatic  . Cellulitis    LOWER EXTREMITIES  . Chronic diarrhea    a/w nausea - felt related to IBS  . Deaf    left side only  . Diastolic CHF ([place])   . Disc degeneration, lumbar   . GERD (gastroesophageal reflux disease)   . Hyperlipidemia    hx rhabdo on statins  . Hypertension   . Neuropathy    feet, toes and fingers  . On home oxygen therapy    uses oxygen 2 liters min per [place] at night and prn during day  . OSA (obstructive sleep apnea)    05/2009 sleep study - refuses CPAP  . Osteoarthritis   . RLS (restless legs syndrome)   . Shortness of breath    chronic  . Stasis dermatitis   . Type II or unspecified type diabetes mellitus without mention of complication, not stated as uncontrolled    insulin dep    SURGICAL HISTORY: Past Surgical History:  Procedure Laterality Date  . BILIARY STENT PLACEMENT N/A 05/06/2016   Procedure: BILIARY STENT PLACEMENT;  Surgeon: Gatha Mayer, MD;  Location: [place];  Service: Endoscopy;  Laterality: N/A;  . CHOLECYSTECTOMY  1997  . COLONOSCOPY N/A 12/03/2012   Procedure: COLONOSCOPY;  Surgeon: Lafayette Dragon, MD;  Location: [facility];  Service: Endoscopy;   Laterality: N/A;  . ENDOSCOPIC RETROGRADE CHOLANGIOPANCREATOGRAPHY (ERCP) WITH PROPOFOL N/A 05/06/2016   Procedure: ENDOSCOPIC RETROGRADE CHOLANGIOPANCREATOGRAPHY (ERCP) WITH PROPOFOL;  Surgeon: Gatha Mayer, MD;  Location: [place];  Service: Endoscopy;  Laterality: N/A;  . ERCP N/A 03/18/2016   Procedure: ENDOSCOPIC RETROGRADE CHOLANGIOPANCREATOGRAPHY (ERCP);  Surgeon: Irene Shipper, MD;  Location: [facility];  Service: Endoscopy;  Laterality: N/A;  . EUS N/A 04/28/2016   Procedure: UPPER ENDOSCOPIC ULTRASOUND (EUS) LINEAR;  Surgeon: [name], MD;  Location: [facility];  Service: Endoscopy;  Laterality: N/A;  . IR CHOLANGIOGRAM EXISTING TUBE  06/09/2016  . IR GENERIC HISTORICAL  05/07/2016   IR BILIARY DRAIN PLACEMENT WITH CHOLANGIOGRAM 05/07/2016 Jacqulynn Cadet, MD [facility]-INTER[facility] RAD  . IR GENERIC HISTORICAL  05/19/2016   IR BILIARY STENT(S) EXISTING ACCESS INC DILATION CATH EXCHANGE 05/19/2016 Jacqulynn Cadet, MD [facility]-INTERV RAD  . TONSILLECTOMY  1970  . TUBAL LIGATION  1980  . UMBILICAL HERNIA REPAIR  1995  . uterine polyp removal  2008    I have reviewed the social history and family history with the patient and they are unchanged from previous note.  ALLERGIES:  is allergic to morphine; statins; sulfa antibiotics; cymbalta [duloxetine hcl]; sulfasalazine; levemir [insulin detemir]; and zinc.  MEDICATIONS:  Current Outpatient Medications  Medication Sig Dispense Refill  . aspirin EC 81 MG tablet Take 1 tablet (81 mg total) by mouth daily.    . baclofen (LIORESAL) 10 MG tablet Take 0.5 tablets (5 mg total) by mouth 3 (three) times daily. 30 each 0  . carvedilol (COREG) 25 MG tablet TAKE ONE TABLET TWICE DAILY WITH FOOD 60 tablet 5  . Cholecalciferol (VITAMIN D3) 2000 units capsule Take 1 capsule (2,000 Units total) by mouth daily. 90 capsule 3  . Continuous Blood Gluc Sensor (FREESTYLE LIBRE SENSOR SYSTEM) MISC Use to check sugars 1 each 0  . diphenoxylate-atropine (LOMOTIL)  2.5-0.025 MG tablet Take 1-2 tablets by mouth 4 (four) times daily as needed for diarrhea or loose stools. 120 tablet 2  . gabapentin (NEURONTIN) 300 MG capsule TAKE TWO CAPSULES FOUR TIMES DAILY 240 capsule 1  . hydrALAZINE (APRESOLINE) 25 MG tablet TAKE ONE TABLET THREE TIMES DAILY 90 tablet 3  . insulin aspart (NOVOLOG) 100 UNIT/ML injection Inject 0-20 Units into the skin 3 (three) times daily with meals. . CBG 70 - 120: 0 units CBG 121 - 150: 3 units CBG 151 - 200: 4 units CBG 201 - 250: 7 units CBG 251 - 300: 11 units CBG 301 - 350: 15 units CBG 351 - 400: 20 units    . insulin glargine (LANTUS) 100 UNIT/ML injection Inject 0.3 mLs (30 Units total) into the skin daily.    [name] lactulose (CHRONULAC) 10 GM/15ML solution Take 10 g by mouth daily as needed for mild constipation.    . lidocaine (LIDODERM) 5 % Place 1 patch daily onto the skin. Remove & Discard patch within 12 hours or as directed by MD (Patient not taking: Reported on 05/02/2017) 30 patch 0  . lidocaine-prilocaine (EMLA) cream Apply to port site prior to being accessed    . lipase/protease/amylase (CREON) 36000 UNITS CPEP capsule 2 po prior to snacks; 4 po prior to meals    . LYRICA 75 MG capsule TAKE ONE CAPSULE EACH DAY 30 capsule 3  . meclizine (ANTIVERT) 25 MG tablet Take 25 mg 2 (two) times daily as needed by mouth for dizziness.     . metFORMIN (GLUCOPHAGE) 500 MG tablet Take 500 mg by mouth daily with breakfast.    . mirtazapine (REMERON) 30 MG tablet Take 1 tablet (30 mg total) by mouth at bedtime. 30 tablet 2  . mupirocin ointment (BACTROBAN) 2 % Apply 1 application topically daily. 90 g 0  . nitrofurantoin, macrocrystal-monohydrate, (MACROBID) 100 MG capsule Take 1 capsule (100 mg total) by mouth 2 (two) times daily. 10 capsule 0  . ondansetron (ZOFRAN) 8 MG tablet Take 1 tablet (8 mg total) by mouth every 8 (eight) hours as needed for nausea or vomiting. 30 tablet 2  . ONETOUCH VERIO test strip USE AS INSTRUCTED UP TO  4 TIMES DAILY 200 each 11  . oxyCODONE (OXY IR/ROXICODONE) 5 MG immediate release tablet Take 1 tablet (5 mg total) by mouth 2 (two) times daily as needed for severe pain. 60 tablet 0  . potassium chloride SA (K-DUR,KLOR-CON) 20 MEQ tablet Take 20 mEq by mouth daily.    [name] rOPINIRole (REQUIP XL) 4 MG 24 hr tablet TAKE ONE TABLET AT BEDTIME 30 tablet 5  . rOPINIRole (REQUIP) 2 MG tablet Take 1 tablet (2 mg total) by mouth 3 (three) times daily. 90 tablet 2  .  sertraline (ZOLOFT) 50 MG tablet Take 50 mg by mouth daily.    . silver sulfADIAZINE (SILVADENE) 1 % cream Apply 1 application daily topically. 50 g 0  . spironolactone (ALDACTONE) 25 MG tablet Take 1 tablet (25 mg total) by mouth daily. 90 tablet 3  . torsemide (DEMADEX) 100 MG tablet Take 0.5 tablets (50 mg total) by mouth daily. Take 100 mg (1 whole tablet) on chemo days and as needed for swelling/weight gain. 90 tablet 3  . traMADol (ULTRAM) 50 MG tablet Take 1 tablet (50 mg total) by mouth every 6 (six) hours as needed. (Patient taking differently: Take 50 mg every 6 (six) hours as needed by mouth for moderate pain. ) 60 tablet 0   No current facility-administered medications for this visit.    Facility-Administered Medications Ordered in Other Visits  Medication Dose Route Frequency Provider Last Rate Last Dose  . ondansetron (ZOFRAN) injection 8 mg  8 mg Intravenous Once Cira Rue K, NP      . sodium chloride flush (NS) 0.9 % injection 10 mL  10 mL Intracatheter PRN Truitt Merle, MD   10 mL at 02/22/17 1729    PHYSICAL EXAMINATION: ECOG PERFORMANCE STATUS: 3-4   Vitals:   05/03/17 1358  BP: (!) 129/44  Pulse: (!) 50  Resp: 16  Temp: 98 F (36.7 C)  SpO2: 99%   Filed Weights   05/03/17 1358  Weight: 203 lb 6.4 oz (92.3 kg)    GENERAL:lethargic, presents in wheelchair  SKIN: skin color, texture, turgor are normal, no rashes or significant lesions EYES: normal, Conjunctiva are pink and non-injected, sclera  clear OROPHARYNX:no exudate, no erythema and lips, buccal mucosa, and tongue normal  LUNGS: clear to auscultation bilaterally with normal breathing effort HEART: regular rate & rhythm (+) murmur (+)  lower extremity edema with venous stasis changes; (+) right leg erythema  ABDOMEN:abdomen soft, non-tender and normal bowel sounds (+) obese NEURO: lethargic, oriented x 3 with fluent speech, generalized weakness   LABORATORY DATA:  I have reviewed the data as listed CBC Latest Ref Rng & Units 05/03/2017 05/02/2017 04/26/2017  WBC 3.9 - 10.3 K/uL 4.2 2.9(L) 5.9  Hemoglobin 11.6 - 15.9 g/dL 8.5(L) 8.4(L) 8.8(L)  Hematocrit 34.8 - 46.6 % 27.0(L) 26.5(L) 28.5(L)  Platelets 145 - 400 K/uL 154 178 201     CMP Latest Ref Rng & Units 05/03/2017 05/02/2017 04/26/2017  Glucose 70 - 140 mg/dL 190(H) 103 280(H)  BUN 7 - 26 mg/dL 18 25 30(H)  Creatinine 0.60 - 1.10 mg/dL 0.83 0.87 1.01  Sodium 136 - 145 mmol/L 140 142 135(L)  Potassium 3.5 - 5.1 mmol/L 4.2 4.1 4.3  Chloride 98 - 109 mmol/L 101 103 96(L)  CO2 22 - 29 mmol/L 30(H) 29 29  Calcium 8.4 - 10.4 mg/dL 9.4 9.5 9.5  Total Protein 6.4 - 8.3 g/dL 6.7 6.7 7.0  Total Bilirubin 0.2 - 1.2 mg/dL 0.7 0.7 0.7  Alkaline Phos 40 - 150 U/L 336(H) 342(H) 325(H)  AST 5 - 34 U/L 82(H) 80(H) 45(H)  ALT 0 - 55 U/L 39 37 19     RADIOGRAPHIC STUDIES: I have personally reviewed the radiological images as listed and agreed with the findings in the report. Ct Chest W Contrast  Result Date: 05/04/2017 CLINICAL DATA:  Restaging of pancreatic cancer. EXAM: CT CHEST, ABDOMEN, AND PELVIS WITH CONTRAST TECHNIQUE: Multidetector CT imaging of the chest, abdomen and pelvis was performed following the standard protocol during bolus administration of intravenous contrast. CONTRAST:  100 cc Isovue 300 COMPARISON:  02/20/2017 FINDINGS: CT CHEST FINDINGS Cardiovascular: Right Port-A-Cath tip: Cavoatrial junction. Atherosclerotic calcification of the aortic arch and left anterior  descending coronary artery. Mitral valve calcification. Mild cardiomegaly. Mediastinum/Nodes: No pathologic adenopathy is identified. Lungs/Pleura: Small scattered bulla are present in the lungs. Previous 2 mm superior segment left lower lobe pulmonary nodule is probably present on image 56/12 but is somewhat blurred. No new or enlarging nodule. Musculoskeletal: Suspected free osteochondral fragments in the right glenohumeral joint posteriorly. Old healed right lateral third rib fracture. Thoracic spondylosis. CT ABDOMEN PELVIS FINDINGS Hepatobiliary: Although there is some vague hypodensity peripherally and inferiorly in the right hepatic lobe in a triangular pattern, I do not see a well-defined focal liver lesion. Pneumobilia is present. Mild intrahepatic and extrahepatic biliary dilatation. The CBD did dilatation extends to the pancreatic head; the CBD is indistinct adjacent to the pancreatic head. Mild worsening of indistinct fluid density/stranding along the porta hepatis. Pancreas: Dorsal pancreatic duct dilatation in the pancreatic body. Stable atrophic appearance of the pancreatic tail and pancreatic body. The infiltrative mass of the lower portion of the pancreatic head and uncinate process is notably indistinct which makes it difficult to measure. There continues to be about 50% extension around the circumference of the superior mesenteric artery for example on image 94/7. The indistinct hypodensity in this vicinity in between the IVC and the superior mesenteric vein measures about 1.8 cm in thickness, previously 1.7 cm. Subjectively the appearance is very similar. The mass abuts the and extends around as much as 50% of the SMV. Spleen: 2 stable small hypodense splenic lesions are present, the larger measuring 1.1 cm in diameter on image 77/7. Adrenals/Urinary Tract: Unremarkable Stomach/Bowel: Prominent stool throughout the colon favors constipation. Vascular/Lymphatic: Aortoiliac atherosclerotic vascular  disease. No portal vein thrombosis or splenic vein thrombosis. Reproductive: Unremarkable Other: Trace edema along the left paracolic gutter. Increase in presacral edema compared to the prior exam. Musculoskeletal: Lower lumbar spondylosis and degenerative disc disease potentially causing mild multilevel impingement. IMPRESSION: 1. Subjectively the pancreatic head and uncinate process mass appears similar to the prior exam. The lesion is associated with about 50% circumferential involvement of the SMA and SMV. No compelling findings of distant metastatic spread at this time. 2. Mild biliary dilatation extending down to the pancreatic head mass. Mild pneumobilia. 3. There is mildly increased infiltrative edema along the porta hepatis. 4. Other imaging findings of potential clinical significance: Prominent stool throughout the colon favors constipation. Aortic Atherosclerosis (ICD10-I70.0). Coronary atherosclerosis. Mitral valve calcification. Mild cardiomegaly. Free osteochondral fragments in the right glenohumeral joint. Lower lumbar spondylosis and degenerative disc disease. 2 stable nonspecific hypodense splenic lesions. Emphysema (ICD10-J43.9). Electronically Signed   By: Van Clines M.D.   On: 05/04/2017 08:53   Ct Abdomen Pelvis W Contrast  Result Date: 05/04/2017 CLINICAL DATA:  Restaging of pancreatic cancer. EXAM: CT CHEST, ABDOMEN, AND PELVIS WITH CONTRAST TECHNIQUE: Multidetector CT imaging of the chest, abdomen and pelvis was performed following the standard protocol during bolus administration of intravenous contrast. CONTRAST:  100 cc Isovue 300 COMPARISON:  02/20/2017 FINDINGS: CT CHEST FINDINGS Cardiovascular: Right Port-A-Cath tip: Cavoatrial junction. Atherosclerotic calcification of the aortic arch and left anterior descending coronary artery. Mitral valve calcification. Mild cardiomegaly. Mediastinum/Nodes: No pathologic adenopathy is identified. Lungs/Pleura: Small scattered bulla are  present in the lungs. Previous 2 mm superior segment left lower lobe pulmonary nodule is probably present on image 56/12 but is somewhat blurred. No new or enlarging nodule. Musculoskeletal: Suspected free  osteochondral fragments in the right glenohumeral joint posteriorly. Old healed right lateral third rib fracture. Thoracic spondylosis. CT ABDOMEN PELVIS FINDINGS Hepatobiliary: Although there is some vague hypodensity peripherally and inferiorly in the right hepatic lobe in a triangular pattern, I do not see a well-defined focal liver lesion. Pneumobilia is present. Mild intrahepatic and extrahepatic biliary dilatation. The CBD did dilatation extends to the pancreatic head; the CBD is indistinct adjacent to the pancreatic head. Mild worsening of indistinct fluid density/stranding along the porta hepatis. Pancreas: Dorsal pancreatic duct dilatation in the pancreatic body. Stable atrophic appearance of the pancreatic tail and pancreatic body. The infiltrative mass of the lower portion of the pancreatic head and uncinate process is notably indistinct which makes it difficult to measure. There continues to be about 50% extension around the circumference of the superior mesenteric artery for example on image 94/7. The indistinct hypodensity in this vicinity in between the IVC and the superior mesenteric vein measures about 1.8 cm in thickness, previously 1.7 cm. Subjectively the appearance is very similar. The mass abuts the and extends around as much as 50% of the SMV. Spleen: 2 stable small hypodense splenic lesions are present, the larger measuring 1.1 cm in diameter on image 77/7. Adrenals/Urinary Tract: Unremarkable Stomach/Bowel: Prominent stool throughout the colon favors constipation. Vascular/Lymphatic: Aortoiliac atherosclerotic vascular disease. No portal vein thrombosis or splenic vein thrombosis. Reproductive: Unremarkable Other: Trace edema along the left paracolic gutter. Increase in presacral edema  compared to the prior exam. Musculoskeletal: Lower lumbar spondylosis and degenerative disc disease potentially causing mild multilevel impingement. IMPRESSION: 1. Subjectively the pancreatic head and uncinate process mass appears similar to the prior exam. The lesion is associated with about 50% circumferential involvement of the SMA and SMV. No compelling findings of distant metastatic spread at this time. 2. Mild biliary dilatation extending down to the pancreatic head mass. Mild pneumobilia. 3. There is mildly increased infiltrative edema along the porta hepatis. 4. Other imaging findings of potential clinical significance: Prominent stool throughout the colon favors constipation. Aortic Atherosclerosis (ICD10-I70.0). Coronary atherosclerosis. Mitral valve calcification. Mild cardiomegaly. Free osteochondral fragments in the right glenohumeral joint. Lower lumbar spondylosis and degenerative disc disease. 2 stable nonspecific hypodense splenic lesions. Emphysema (ICD10-J43.9). Electronically Signed   By: Van Clines M.D.   On: 05/04/2017 08:53     ASSESSMENT & PLAN: 72 y.o.female with PMH of IDDM with neuropathy, HFpEF, HTN, OSA on intermittent home oxygen, and recently diagnosed pancreatic mass s/p biliary stent 04/28/2016 who was recently treated for acute cholangitis, status post cutaneous biliary drainage. She had biliary drainage tube exchange with peri-stent placement by IR Dr. Laurence Ferrari   1. Lethargy, weakness, malnutrition, deconditioning  -Ms. Klinge appears stable for outpatient management. She is lethargic from over medication. We reviewed dosing instructions for medication including new Rx for baclofen for back pain. Due to allergy to sulfasalazine and potential for cross sensitivity, will not prescribe meloxicam. I suggest she try baclofen 0.5 tablet TID as prescribed. Her renal function is normal, OK to try NSAIDs for now as long as she is able to stay hydrated. Will call to check  on her status in 2 days.  -She has worsening fatigue and deconditioning, does not appear to be tolerating chemotherapy well. Will provide supportive care with IVF today and hold further chemotherapy for now.  -f/u in 1 week   2. Pancreatic adenocarcinoma, borderline resectable, cT2N0M0, stage IB -S/p cycle 6 single agent gemcitabine on 04/26/17. She has poor tolerance to chemo. She underwent restaging  CT today, result was not available during today's visit. The pancreatic head and uncinate process mass appears similar to the prior exam. The lesion is assoc with approx 50% circumferential involvement of the SMA and SMV. No definite evidence of distant metastatic disease.  -Given her minimal response to therapy and poor performance status, Dr. Burr Medico does not recommend further chemo at this time. Will offer supportive care in the interim.   3. Goals of care -The patient understands she is not tolerating treatment well, and her overall condition is poor. She is open to option for hospice as a means for home medical management. Will discuss further at next f/u.   4. UTI -On Macrobid as prescribed; no further urinary symptoms.  PLAN: 500 cc NS over 1 hour today F/u in 1 week Baclofen TID and other supportive measures for back pain  All questions were answered. The patient knows to call the clinic with any problems, questions or concerns. No barriers to learning was detected. I spent 30 minutes counseling the patient face to face. The total time spent in the appointment was 45 minutes and more than 50% was on counseling and review of test results     Alla Feeling, NP 05/04/17   Addendum  I have seen the patient, examined her. I agree with the assessment and and plan and have edited the notes.   Ms Gene be very drowsy today, likely secondary to baclofen.  Reviewed last cycle chemotherapy poorly.  She recently was seen by our symptom management clinic for severe back pain, likely secondary  to muscle strain.  Had restaging CT scan done earlier today, the results are pending.  Due to her overall poor condition, I recommend her to hold chemotherapy for now, and I doubt she would be a candidate for further more chemotherapy.  We will give her some IV fluids today, will call her son tomorrow when we have the CT results back.  Plan to see her back next week, will likely discuss palliative care and hospice on next visit.  Truitt Merle  05/03/2017

## 2017-05-04 ENCOUNTER — Ambulatory Visit: Payer: PPO | Admitting: Nurse Practitioner

## 2017-05-04 ENCOUNTER — Encounter: Payer: Self-pay | Admitting: Nurse Practitioner

## 2017-05-09 NOTE — Progress Notes (Signed)
[place]  [phone] Fax:([phone]) [phone]  Clinic Follow Up Note   Patient Care Team: [name], MD as PCP - General (Internal Medicine) Rigoberto Noel, MD as Consulting Physician (Pulmonary Disease) Larey Dresser, MD as Consulting Physician (Cardiology) Megan Salon, MD as Consulting Physician (Gynecology) Kristeen Miss, MD as Consulting Physician (Neurosurgery) Earlie Server, MD (Orthopedic Surgery) Almedia Balls, MD (Orthopedic Surgery) Lafayette Dragon, MD (Inactive) (Gastroenterology) Renato Shin, MD (Endocrinology) Darleen Crocker, MD (Ophthalmology)   Date of Service: 05/11/2017  CHIEF COMPLAINTS:  Follow up pancreatic Cancer   Oncology History   Cancer Staging Malignant neoplasm of head of pancreas [facility]) Staging form: Exocrine Pancreas, AJCC 8th Edition - Clinical stage from 04/28/2016: Stage IB (cT2, cN0, cM0) - Signed by Truitt Merle, MD on 02/01/2017       Malignant neoplasm of head of pancreas ([place])   02/2016 Initial Diagnosis    Malignant neoplasm of head of pancreas ([place])      03/15/2016 - 03/21/2016 Hospital Admission    Presents with nausea, vomiting, and jaundice   Patient was found hyperbilirubinemic with abnormal abdominal CT suggesting obstructive physiology. Referred for admission and evaluation.      03/15/2016 Imaging    CT Abdomen Pelvis w Contrast 1. Cholecystectomy with borderline biliary duct dilatation. 2. Pancreatic atrophy with moderate pancreatic duct dilatation, followed to the level of the duodenum. 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 Considerations include otherwise occult stricture, periampullary lesion, or main duct intraductal papillary mucinous neoplasm. Given the combination of borderline biliary duct dilatation, moderate pancreatic duct dilatation, and the clinical history of jaundice, consider further evaluation with ERCP. If the patient is not a good ERCP candidate, MRCP with and  without contrast would be another option. 3.  No acute process in the abdomen or pelvis. 4.  Tiny hiatal hernia. 5.  Aortic atherosclerosis.      03/16/2016 Imaging    MRI Abdomen 1. Hypoenhancing lesion in the uncinate process of the pancreas, better demonstrated by MR than on the recent CT scan. The dilated main pancreatic duct and dilated common bile duct abruptly terminate at the cranial margin of this lesion. MR imaging features are concerning for pancreatic adenocarcinoma. ERCP with endoscopic ultrasound would likely prove helpful to further evaluate. 2. No definite metastatic disease in the liver. No peripancreatic lymphadenopathy is identified. Of note, this exam is limited by marked motion degradation and small liver lesions could be obscured. CT scan earlier today demonstrated no peripancreatic lymphadenopathy.      03/18/2016 Procedure    ERCP 1. Malignant-appearing distal bile duct stricturestatus post ERCP with sphincterotomy and plastic biliary stent placement 2. Guidewire finding false lumen with subsequentinjection as described 3. Status post cholecystectomy  Recommendation: 1. Clear liquid diet 2. Unasyn 1.5 g every 6 hours 3. Standard post ERCP observation. Indomethacin suppositories given 4. Trend LFTs 5. Will need endoscopic ultrasound with biopsy with Dr. Ardis Hughs outpatient.      04/28/2016 Pathology Results    Endoscopic [place] and Biopsy FINE NEEDLE ASPIRATION, ENDOSCOPIC, PANCREAS UNCINATE (SPECIMEN 1 OF 1 COLLECTED 04/28/16): MALIGNANT CELLS CONSISTENT WITH ADENOCARCINOMA.      05/04/2016 -  Hospital Admission    Presents with fever, abdominal pain, malaise, confusion. She was treated for acute cholangitis.       05/05/2016 Imaging    CT Abdomen Pelvis w Contrast Interval placement of a bile duct stent with some decompression of the bile ducts. Mild residual biliary dilatation. Pancreatic ductal dilatation and atrophy. Known  pancreatic mass lesion is  not well depicted at CT. No evidence of bowel obstruction or inflammation.      05/05/2016 Imaging    CT Head w/o Contrast 05/05/16 IMPRESSION: No acute intracranial pathology.      05/06/2016 Procedure    ERCP and Bilary stent placement  Imaging demonstrating removal of endoscopic common bile duct stent. The stent was not able to be replaced.       05/07/2016 Imaging    CT Chest wo Contrast  1. New small right and trace left pleural effusions. Mild cardiomegaly. 2. Borderline enlarged right hilar lymph node at 1 cm in short axis. 3. No definite findings of metastatic disease to the chest. 4. Centrilobular emphysema. 5. High density in the visualized part of the dorsal pancreatic duct, probably contrast medium. 6. Subacute healing fracture of the right seventh rib laterally. 7. Atherosclerosis, including the left anterior descending coronary artery.      05/07/2016 Procedure    On 05/07/16, percutaneous transhepatic cholangiogram and placement of a 32F internal/external biliary drain was performed by IR with aspirated purulent bile sent for culture. Bile culture grew pantoea species, enterococcus faecalis, viridans streptococcus.      07/11/2016 - 07/21/2016 Radiation Therapy    Diagnosis:   cT2N0M0  adenocarcinoma of the pancreas  Indication for treatment:  Curative  Radiation treatment dates:   07/11/16 - 07/21/16  Site/dose:   Pancreas: 33 Gy in 5 fractions  Beams/energy:   SBRT/SRT-VMAT // 10X-FFF Photon  Narrative: The patient tolerated radiation treatment relatively well. The patient complained of diarrhea during treatment. She was instructed to purchase Imodium.      08/2016 - 10/2016 Chemotherapy    She had chemotherapy consistent of gemcitabine and abraxine for 2-3 months under the care of Dr. Berneice Gandy at [facility] with her last treatment being about 10/2016.        02/20/2017 Imaging    CT CAP 02/20/17  IMPRESSION: 1. Response to therapy of the primary pancreatic  head/uncinate process lesion. Please note that delineation of the tumor is difficult secondary to anatomic distortion from therapy. 2. No new or progressive disease identified. 3. Removal of biliary stent, with pneumobilia and borderline similar intrahepatic biliary duct dilatation. 4.  No acute process or evidence of metastatic disease in the chest. 5.  Possible constipation. 6. Coronary artery atherosclerosis. Aortic Atherosclerosis (ICD10-I70.0). 7.  Emphysema (ICD10-J43.9). 8. Pulmonary artery enlargement suggests pulmonary arterial hypertension.      02/22/2017 - 04/26/2017 Chemotherapy    Single agent Gemcitabine every 2-3 weeks on and 1 week off, starting 02/22/17. Stopped after cycle 6 on 04/26/17 due to low benefit and intolerance.      05/03/2017 Imaging    IMPRESSION: 1. Subjectively the pancreatic head and uncinate process mass appears similar to the prior exam. The lesion is associated with about 50% circumferential involvement of the SMA and SMV. No compelling findings of distant metastatic spread at this time. 2. Mild biliary dilatation extending down to the pancreatic head mass. Mild pneumobilia. 3. There is mildly increased infiltrative edema along the porta hepatis. 4. Other imaging findings of potential clinical significance: Prominent stool throughout the colon favors constipation. Aortic Atherosclerosis (ICD10-I70.0). Coronary atherosclerosis. Mitral valve calcification. Mild cardiomegaly. Free osteochondral fragments in the right glenohumeral joint. Lower lumbar spondylosis and degenerative disc disease. 2 stable nonspecific hypodense splenic lesions. Emphysema (ICD10-J43.9).        HISTORY OF PRESENTING ILLNESS (05/19/2016):  Melissa Myers 72 y.o. female is here because the patient presented  to the ED on 03/15/16 due to weakness, hyperglycemia, jaundice, abdominal pain, mild confusion, and chronic diarrhea. Physical exam at the time revealed abdominal distension and  diffuse tenderness. Labs found the patient hyperbilirubinemic. CT scan of the abdomen showed pancreatic atrophy with moderate pancreatic duct dilatation, followed to the level of the duodenum. 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 Considerations include otherwise occult stricture, periampullary lesion, or main duct intraductal papillary mucinous neoplasm. MRI of the abdomen the same day showed a 2.2 x 2.8 cm hyphenating lesion in the uncinate process of the pancreas, better demonstrated by MRI than on the recent CT scan. The dilated main pancreatic duct and dilated common bile duct abruptly terminate at the cranial margin of this lesion. MRI features are concerning for pancreatic adenocarcinoma.CA 19-9 on 03/17/16 was 107; elevated.  The patient underwent ERCP, with biliary sphincterotomy and biliary stent placement by Dr. Henrene Pastor 03/18/16. The patient was subsequently discharged on 03/21/16.  On 03/27/16, the patient presented to the ED on 03/29/16 for flu like symptoms. She was Influenza A positive. She was given fluids and discharged with Tamiflu. She presented back to the ED the following day for worsening symptoms. She was started on antibiotics for possible developing pneumonia giving a productive cough.  Given enough time to recover from her hospitalization and the flu, the patient underwent biopsy of the pancreatic mass by Dr. Ardis Hughs on 04/28/16 revealing adenocarcinoma.  Since then, the patient presented to the ED on 05/04/16 for a headaches, hyperglycemia, confusion, abdominal pain, and nausea. Given presentation of fever/confusion, she was admitted to the hospital.  CT of the head on 05/05/16 was negative. CT of the abd/pelvis on 05/05/16 showed interval placement of a bile duct stent with some decompression of the bile ducts, mild residual biliary dilatation, and pancreatic ductal dilatation and atrophy.  She was diagnosed with cholangitis and recurrent jaundice. Zosyn was started. ERCP on 05/06/16  showed a clogged stent which was removed, though a new stent was not able to be placed across a malignant biliary stricture. On 05/07/16, percutaneous transhepatic cholangiogram and placement of a 23F internal/external biliary drain was performed by IR with aspirated purulent bile sent for culture. Blood cultures remained negative, though bile culture grew pantoea species, enterococcus faecalis, viridans streptococcus.Urine culture had also grown Klebsiella pneumoniae, sensitive to Zosyn.  I see the patient today in an inpatient setting to discuss treatment options for the management of her pancreatic adenocarcinoma. I met patient, her husband and son in her room. According to patient's family, she used to be independent, does self care and housework, walks with a walker. Due to fatigue, anorexia, she has not been able to perform her daily activities in the past 4-6 weeks, and has spent most time in bed and chair for the past few weeks.    CURRENT THERAPY: Observation   INTERIM HISTORY:  Kaina returns for follow-up. She was accompanied by her caretaker Langley Gauss and her daughter. She notes today she is doing better. She recently saw her Cardiologist assistant and has not followed up since. She is currently on Lasix. Pt notes her 50 yo mother has come to live with her after recent hospitalization. She has 2-3 helpers in her household available.   On review of symptoms, pt notes her back pain is gone and she feels more alert. She does not feel sick anymore. Her pain is in her lower legs to her ankles. She says they constantly feel on fire. She tries to put her feet up at home. She denies  fever, cough or chest pain, SOB or abdominal pain. Her appetite is back to normal.      MEDICAL HISTORY:  Past Medical History:  Diagnosis Date  . Anemia   . Cancer [facility])    Pancreatic  . Cellulitis    LOWER EXTREMITIES  . Chronic diarrhea    a/w nausea - felt related to IBS  . Deaf    left side only  .  Diastolic CHF ([place])   . Disc degeneration, lumbar   . GERD (gastroesophageal reflux disease)   . Hyperlipidemia    hx rhabdo on statins  . Hypertension   . Neuropathy    feet, toes and fingers  . On home oxygen therapy    uses oxygen 2 liters min per [place] at night and prn during day  . OSA (obstructive sleep apnea)    05/2009 sleep study - refuses CPAP  . Osteoarthritis   . RLS (restless legs syndrome)   . Shortness of breath    chronic  . Stasis dermatitis   . Type II or unspecified type diabetes mellitus without mention of complication, not stated as uncontrolled    insulin dep    SURGICAL HISTORY: Past Surgical History:  Procedure Laterality Date  . BILIARY STENT PLACEMENT N/A 05/06/2016   Procedure: BILIARY STENT PLACEMENT;  Surgeon: Gatha Mayer, MD;  Location: [place];  Service: Endoscopy;  Laterality: N/A;  . CHOLECYSTECTOMY  1997  . COLONOSCOPY N/A 12/03/2012   Procedure: COLONOSCOPY;  Surgeon: Lafayette Dragon, MD;  Location: [facility];  Service: Endoscopy;  Laterality: N/A;  . ENDOSCOPIC RETROGRADE CHOLANGIOPANCREATOGRAPHY (ERCP) WITH PROPOFOL N/A 05/06/2016   Procedure: ENDOSCOPIC RETROGRADE CHOLANGIOPANCREATOGRAPHY (ERCP) WITH PROPOFOL;  Surgeon: Gatha Mayer, MD;  Location: [place];  Service: Endoscopy;  Laterality: N/A;  . ERCP N/A 03/18/2016   Procedure: ENDOSCOPIC RETROGRADE CHOLANGIOPANCREATOGRAPHY (ERCP);  Surgeon: Irene Shipper, MD;  Location: [facility];  Service: Endoscopy;  Laterality: N/A;  . EUS N/A 04/28/2016   Procedure: UPPER ENDOSCOPIC ULTRASOUND (EUS) LINEAR;  Surgeon: [name], MD;  Location: [facility];  Service: Endoscopy;  Laterality: N/A;  . IR CHOLANGIOGRAM EXISTING TUBE  06/09/2016  . IR GENERIC HISTORICAL  05/07/2016   IR BILIARY DRAIN PLACEMENT WITH CHOLANGIOGRAM 05/07/2016 Jacqulynn Cadet, MD [facility]  . IR GENERIC HISTORICAL  05/19/2016   IR BILIARY STENT(S) EXISTING ACCESS INC DILATION CATH EXCHANGE 05/19/2016 Jacqulynn Cadet, MD [facility]-INTER[facility]  . TONSILLECTOMY  1970  . TUBAL LIGATION  1980  . UMBILICAL HERNIA REPAIR  1995  . uterine polyp removal  2008    SOCIAL HISTORY: Social History   Socioeconomic History  . Marital status: Married    Spouse name: Not on file  . Number of children: Not on file  . Years of education: Not on file  . Highest education level: Not on file  Occupational History  . Not on file  Social Needs  . Financial resource strain: Not on file  . Food insecurity:    Worry: Not on file    Inability: Not on file  . Transportation needs:    Medical: Not on file    Non-medical: Not on file  Tobacco Use  . Smoking status: Never Smoker  . Smokeless tobacco: Never Used  Substance and Sexual Activity  . Alcohol use: No  . Drug use: No  . Sexual activity: Never  Lifestyle  . Physical activity:    Days per week: Not on file    Minutes per  session: Not on file  . Stress: Not on file  Relationships  . Social connections:    Talks on phone: Not on file    Gets together: Not on file    Attends religious service: Not on file    Active member of club or organization: Not on file    Attends meetings of clubs or organizations: Not on file    Relationship status: Not on file  . Intimate partner violence:    Fear of current or ex partner: Not on file    Emotionally abused: Not on file    Physically abused: Not on file    Forced sexual activity: Not on file  Other Topics Concern  . Not on file  Social History Narrative   Lives with spouse and mother. Retired Network engineer, now housewife    FAMILY HISTORY: Family History  Problem Relation Age of Onset  . Heart disease Mother   . Heart attack Mother 86  . Heart disease Father   . Heart attack Father 62  . Heart disease Unknown        family history  . Stomach cancer Paternal Grandmother   . Lung cancer Paternal Grandfather   . CVA Unknown        several aunts  . Heart attack Unknown        several aunts and an  uncle  . Colon cancer Neg Hx     ALLERGIES:  is allergic to morphine; statins; sulfa antibiotics; cymbalta [duloxetine hcl]; sulfasalazine; levemir [insulin detemir]; and zinc.  MEDICATIONS:  Current Outpatient Medications  Medication Sig Dispense Refill  . aspirin EC 81 MG tablet Take 1 tablet (81 mg total) by mouth daily.    . baclofen (LIORESAL) 10 MG tablet Take 0.5 tablets (5 mg total) by mouth 3 (three) times daily. 30 each 0  . carvedilol (COREG) 25 MG tablet TAKE ONE TABLET TWICE DAILY WITH FOOD 60 tablet 5  . Cholecalciferol (VITAMIN D3) 2000 units capsule Take 1 capsule (2,000 Units total) by mouth daily. 90 capsule 3  . Continuous Blood Gluc Sensor (FREESTYLE LIBRE SENSOR SYSTEM) MISC Use to check sugars 1 each 0  . diphenoxylate-atropine (LOMOTIL) 2.5-0.025 MG tablet Take 1-2 tablets by mouth 4 (four) times daily as needed for diarrhea or loose stools. 120 tablet 2  . gabapentin (NEURONTIN) 300 MG capsule TAKE TWO CAPSULES FOUR TIMES DAILY 240 capsule 1  . hydrALAZINE (APRESOLINE) 25 MG tablet TAKE ONE TABLET THREE TIMES DAILY 90 tablet 3  . insulin aspart (NOVOLOG) 100 UNIT/ML injection Inject 0-20 Units into the skin 3 (three) times daily with meals. . CBG 70 - 120: 0 units CBG 121 - 150: 3 units CBG 151 - 200: 4 units CBG 201 - 250: 7 units CBG 251 - 300: 11 units CBG 301 - 350: 15 units CBG 351 - 400: 20 units    . insulin glargine (LANTUS) 100 UNIT/ML injection Inject 0.3 mLs (30 Units total) into the skin daily.    [name] lactulose (CHRONULAC) 10 GM/15ML solution Take 10 g by mouth daily as needed for mild constipation.    . lidocaine (LIDODERM) 5 % Place 1 patch daily onto the skin. Remove & Discard patch within 12 hours or as directed by MD (Patient not taking: Reported on 05/02/2017) 30 patch 0  . lidocaine-prilocaine (EMLA) cream Apply to port site prior to being accessed    . lipase/protease/amylase (CREON) 36000 UNITS CPEP capsule 2 po prior to snacks; 4 po prior to  meals    . LYRICA 75 MG capsule TAKE ONE CAPSULE EACH DAY 30 capsule 3  . meclizine (ANTIVERT) 25 MG tablet Take 25 mg 2 (two) times daily as needed by mouth for dizziness.     . metFORMIN (GLUCOPHAGE) 500 MG tablet Take 500 mg by mouth daily with breakfast.    . mirtazapine (REMERON) 30 MG tablet Take 1 tablet (30 mg total) by mouth at bedtime. 30 tablet 2  . mupirocin ointment (BACTROBAN) 2 % Apply 1 application topically daily. 90 g 0  . nitrofurantoin, macrocrystal-monohydrate, (MACROBID) 100 MG capsule Take 1 capsule (100 mg total) by mouth 2 (two) times daily. 10 capsule 0  . ondansetron (ZOFRAN) 8 MG tablet Take 1 tablet (8 mg total) by mouth every 8 (eight) hours as needed for nausea or vomiting. 30 tablet 2  . ONETOUCH VERIO test strip USE AS INSTRUCTED UP TO 4 TIMES DAILY 200 each 11  . oxyCODONE (OXY IR/ROXICODONE) 5 MG immediate release tablet Take 1 tablet (5 mg total) by mouth 2 (two) times daily as needed for severe pain. 60 tablet 0  . potassium chloride SA (K-DUR,KLOR-CON) 20 MEQ tablet Take 20 mEq by mouth daily.    [name] rOPINIRole (REQUIP XL) 4 MG 24 hr tablet TAKE ONE TABLET AT BEDTIME 30 tablet 5  . rOPINIRole (REQUIP) 2 MG tablet Take 1 tablet (2 mg total) by mouth 3 (three) times daily. 90 tablet 2  . sertraline (ZOLOFT) 50 MG tablet Take 50 mg by mouth daily.    . silver sulfADIAZINE (SILVADENE) 1 % cream Apply 1 application daily topically. 50 g 0  . spironolactone (ALDACTONE) 25 MG tablet Take 1 tablet (25 mg total) by mouth daily. 90 tablet 3  . torsemide (DEMADEX) 100 MG tablet Take 0.5 tablets (50 mg total) by mouth daily. Take 100 mg (1 whole tablet) on chemo days and as needed for swelling/weight gain. 90 tablet 3  . traMADol (ULTRAM) 50 MG tablet Take 1 tablet (50 mg total) by mouth every 6 (six) hours as needed. (Patient taking differently: Take 50 mg every 6 (six) hours as needed by mouth for moderate pain. ) 60 tablet 0   No current facility-administered medications  for this visit.    Facility-Administered Medications Ordered in Other Visits  Medication Dose Route Frequency Provider Last Rate Last Dose  . ondansetron (ZOFRAN) injection 8 mg  8 mg Intravenous Once Cira Rue K, NP      . sodium chloride flush (NS) 0.9 % injection 10 mL  10 mL Intracatheter PRN Truitt Merle, MD   10 mL at 02/22/17 1729    REVIEW OF SYSTEMS:   Constitutional: alert, no distress and comfortable Eyes: Denies blurriness of vision, double vision or watery eyes Ears, nose, mouth, throat, and face: Denies mucositis or sore throat Respiratory: Denies cough, dyspnea or wheezes Cardiovascular: Denies palpitation, chest discomfort or (+) chronic B/L extremity swelling, with pain and tenderness Gastrointestinal:  negative Skin: Denies abnormal skin rashes (+) erythema of B/l LE Lymphatics: Denies new lymphadenopathy or easy bruising MSK: (+) arthritis, manageable Neurological:(+) neuropathy, stable Behavioral/Psych: Mood is stable, no new changes  All other systems were reviewed with the patient and are negative.  PHYSICAL EXAMINATION:  ECOG PERFORMANCE STATUS: 3 - Symptomatic, >50% confined to bed  Vitals:   05/11/17 1117  BP: (!) 141/58  Pulse: 67  Resp: 20  Temp: 98.5 F (36.9 C)  SpO2: 98%   Filed Weights   05/11/17 1117  Weight: 211  lb 12.8 oz (96.1 kg)    GENERAL: Alert, sitting in wheelchair  SKIN: skin color, texture, turgor are normal, no rashes, (+) diffuse skin erythema B/L LE below knee and above ankle.  EYES: normal, conjunctiva are pink and non-injected, sclera clear OROPHARYNX:no exudate, no erythema and lips, buccal mucosa, and tongue normal  NECK: supple, thyroid normal size, non-tender, without nodularity LYMPH:  no palpable lymphadenopathy in the cervical, axillary or inguinal LUNGS: clear to auscultation and percussion with normal breathing effort HEART: regular rate & rhythm and no murmurs (+) 2+ pitting edema in B/L LE below knee ABDOMEN:  tenderness to epigastrium, but overall abdomen is soft. No organomegaly, bowel sounds normal.  Musculoskeletal: no cyanosis of digits and no clubbing.  PSYCH: alert & oriented x 3 with fluent speech NEURO: no focal motor/sensory deficits   LABORATORY DATA:  I have reviewed the data as listed CBC Latest Ref Rng & Units 05/11/2017 05/03/2017 05/02/2017  WBC 3.9 - 10.3 K/uL 6.0 4.2 2.9(L)  Hemoglobin 11.6 - 15.9 g/dL 8.5(L) 8.5(L) 8.4(L)  Hematocrit 34.8 - 46.6 % 27.5(L) 27.0(L) 26.5(L)  Platelets 145 - 400 K/uL 383 154 178   CMP Latest Ref Rng & Units 05/11/2017 05/03/2017 05/02/2017  Glucose 70 - 140 mg/dL 306(H) 190(H) 103  BUN 7 - 26 mg/dL 23 18 25   Creatinine 0.60 - 1.10 mg/dL 1.04 0.83 0.87  Sodium 136 - 145 mmol/L 135(L) 140 142  Potassium 3.5 - 5.1 mmol/L 4.4 4.2 4.1  Chloride 98 - 109 mmol/L 100 101 103  CO2 22 - 29 mmol/L 26 30(H) 29  Calcium 8.4 - 10.4 mg/dL 8.5 9.4 9.5  Total Protein 6.4 - 8.3 g/dL 6.1(L) 6.7 6.7  Total Bilirubin 0.2 - 1.2 mg/dL 0.4 0.7 0.7  Alkaline Phos 40 - 150 U/L 272(H) 336(H) 342(H)  AST 5 - 34 U/L 35(H) 82(H) 80(H)  ALT 0 - 55 U/L 23 39 37     RADIOGRAPHIC STUDIES: I have personally reviewed the radiological images as listed and agreed with the findings in the report.   CT CAP 05/03/17 IMPRESSION: 1. Subjectively the pancreatic head and uncinate process mass appears similar to the prior exam. The lesion is associated with about 50% circumferential involvement of the SMA and SMV. No compelling findings of distant metastatic spread at this time. 2. Mild biliary dilatation extending down to the pancreatic head mass. Mild pneumobilia. 3. There is mildly increased infiltrative edema along the porta hepatis. 4. Other imaging findings of potential clinical significance: Prominent stool throughout the colon favors constipation. Aortic Atherosclerosis (ICD10-I70.0). Coronary atherosclerosis. Mitral valve calcification. Mild cardiomegaly. Free osteochondral  fragments in the right glenohumeral joint. Lower lumbar spondylosis and degenerative disc disease. 2 stable nonspecific hypodense splenic lesions. Emphysema (ICD10-J43.9).    CT CAP 02/20/17  IMPRESSION: 1. Response to therapy of the primary pancreatic head/uncinate process lesion. Please note that delineation of the tumor is difficult secondary to anatomic distortion from therapy. 2. No new or progressive disease identified. 3. Removal of biliary stent, with pneumobilia and borderline similar intrahepatic biliary duct dilatation. 4.  No acute process or evidence of metastatic disease in the chest. 5.  Possible constipation. 6. Coronary artery atherosclerosis. Aortic Atherosclerosis (ICD10-I70.0). 7.  Emphysema (ICD10-J43.9). 8. Pulmonary artery enlargement suggests pulmonary arterial hypertension.    CT Angio Chest PE W or WO Contrast, 12/08/2016 IMPRESSION: No evidence of pulmonary embolism. Thickening of the pulmonic valve ; if there is clinical concern for valvular disease consider echocardiography. Mild dilatation of the main  pulmonary artery which may reflect pulmonary artery hypertension. Atherosclerotic calcifications aorta and coronary arteries. Aortic Atherosclerosis (ICD10-I70.0).  CT Chest WO Contrast 05/07/16 Carlean Purl IMPRESSION: 1. New small right and trace left pleural effusions. Mild cardiomegaly. 2. Borderline enlarged right hilar lymph node at 1 cm in short axis. 3. No definite findings of metastatic disease to the chest. 4. Centrilobular emphysema. 5. High density in the visualized part of the dorsal pancreatic duct, probably contrast medium. 6. Subacute healing fracture of the right seventh rib laterally. 7. Atherosclerosis, including the left anterior descending coronary artery.  DG ERCP 05/06/16 Carlean Purl IMPRESSION: Imaging demonstrating removal of endoscopic common bile duct stent. The stent was not able to be replaced.   CT Abd, Head, Pelvis  05/05/16 IMPRESSION: Interval placement of a bile duct stent with some decompression of the bile ducts. Mild residual biliary dilatation. Pancreatic ductal dilatation and atrophy. Known pancreatic mass lesion is not well depicted at CT. No evidence of bowel obstruction or inflammation.   ASSESSMENT & PLAN: 72 y.o.female with PMH of IDDM with neuropathy, HFpEF, HTN, OSA on intermittent home oxygen, and recently diagnosed pancreatic mass s/p biliary stent 04/28/2016 who was recently treated for acute cholangitis, status post cutaneous biliary drainage. She had biliary drainage tube exchange with peri-stent placement by IR Dr. Laurence Ferrari today  1. Pancreatic adenocarcinoma, borderline resectable, [id number], stage IB --I previously reviewed her CT scan findings, her pancreatic cancer is probably resectable, no evidence of distant metastasis on CT scans. She was evaluated by general surgeon Dr. Barry Dienes. However she may not be a good candidate due to her advanced age, comorbidities, malnutrition and a very poor performance status. -We previously reviewed the natural history of pancreatic cancer, and a very high risk of recurrence after surgical resection. Most people requires neoadjuvant or adjuvant chemotherapy and possible radiation -We previously have discussed her case in our she had tumor board. Dr. Barry Dienes did not think she was a candidate for whipple surgery. I recommended RT.  -She completed Pancreatic SBRT with Dr. Lisbeth Renshaw 07/11/16 - 07/21/16. -She had chemotherapy consistent of gemcitabine and abraxane for 2-3 months under the care of Dr. Berneice Gandy at [facility] with her last treatment being about 10/2016.  Her restaging CT scan in September 2018 at [facility] showed stable disease. -Patient was not a candidate for surgery via Dr. Mariah Milling at [facility]  -I reviewed the natural course of pancreatic cancer, and her disease is not curable since her surgery is not an option. The goal of therapy is to prolong her life and preserve her  quality of life. Discussed with patient and her friend regarding continuing chemotherapy vs supportive care alone. After lengthy discussion with patient and her husband, she chose to proceed with chemo. I suggest single agent gemcitabine 2 weeks on and 1 week off. She is agreeable.  -We reviewed her CT CAP from 02/20/17 which shows her tumor has shrunk and no evidence of metastasis. She has good response to her treatment.  -She has started single agent Gemcitabine weekly for 2 weeks, every 21 days. I do not think she is a candidate for gemcitabine and Abraxane, which she was previously on. She is tolerating well overall -Pt did not tolerate cycle 6 well. She had worsening fatigue and deconditioning, does not appear to be tolerating chemotherapy well. Provided supportive care with IVF on 05/03/17 and held further chemotherapy for now.  -We discussed CT CAP from 05/03/17 which shows over all stable primary tumor in pancrease with no new lesions.  -I  feel chemotherapy has stabilized her tumor but she has not tolerated treatment well. I recommend her to stop observation at this point as the benefit of single agent chemotherapy is small and her tumor burden has decreased.  -I do not recommend more aggressive chemotherapy as she is not likely eligible for Whipple surgery due to surrounding blood vessels invasion and her overall poor candidacy for surgery.  -I previously referred her to genetics to rule out BRCA 1/2 mutation and lynch syndrome to see if she is a candidate for PARP inhibitor or immunotherapy and to check  MSI or MMR on her previous biopsy to see if she has MSI high disease. -I reviewed cancer progression symptoms to watch for including worsening abdominal pain and jaundice. If this occurs palliatives radiation is an option.  -I encouraged her to continue to follow up with her PCP and Cardiologist for her comorbidities.  -f/u in 1 month  2. B/l LE cellulitis and edema  -She was given a course of  Keflex in early Feb 2019, no significant improvement.  Doppler was negative for DVT -She is allergic to sulfa, I previously prescribed her 7 days of clindamycin -I encouraged her to use compression stockings, elevate legs, and slight increase her dose of Lasix, to improve her leg edema, hopefully that would improve her cellulitis also -She completed 2 courses of antibiotics, keflex and clindamycin.  -Right leg erythema is stable, but not resolved. She has chronic venous stasis changes in both legs.  -I strongly encouraged her to elevate her legs at home and get compression socks.  -She is afebrile. Will monitor.   3. Diarrhea, possibly related to pancreatic insufficiency  -Ruled out C. Diff with lab. Her stool was loose but not watery, and c-diff PCR was not to perform.  No increased white count, less likely this is infectious. -Previously encouraged her to continue Creon -will treat patient diarrhea with lomotil Rx.  -I previously advised her to increase lomotil up to 6 tabs a day and to take her creon in the morning and then with every meal. With Imodium as needed -She has 2+ episodes diarrhea per day at baseline. Takes creon as instructed. She has not tried lomotil; I reviewed dosing instructions and indications for use to avoid dehydration and electrolyte imbalance. -She has lactulose at home to use for constipation -Diarrhea/constipation controlled.    4. IDDM, morbid obesity -follow up with PCP -On metformin, lantus, and novolog.   5. Chronic diastolic heart failure -follow up with PCP -Was seen in cardiology f/u 2/20, increased torsemide to 100 mg x2 days then back to 50 mg daily and to repeat PRN for increased volume status. She was instructed to take 100 mg torsemide on chemo days with IV fluids. She is on fluid restriction to <2 L daily and salt restriction.  -Continue to f/u with cardiologist  6. Malnutrition and deconditioning -I previously encouraged her to continue physical  therapy at her rehabilitation -Appetite is increased on Remeron. Encouraged her to eat well and be active.    7. Chronic Venous Stasis  -Patient with negative doppler study on 03/29/2017 -Advised the patient to elevate her legs at home while resting to improve venous return.  -previously advised that the patient start using compression stockings to aid with BLE swelling -previously advised the patient to began taking 1 tablet of Lasix daily for 3-5 days and then proceed to 0.5 tablet as prescribed.  -previously advised that the patient consume an increased amount of water due  to the lasix prescription.  -Persists with 2+ pitting edema, continue Lasix.   8. Goal of care discussion  -We again discussed the incurable nature of her cancer, and the overall poor prognosis, especially if she does not have good response to chemotherapy or progress on chemo -The patient understands the goal of care is palliative. -I previously recommended DNR/DNI, she will think about it    9. Genetics  -Due to her personal history of pancreas cancer, she qualifies for genetics to rule out genetic mutation such as BRCA to determine if she qualifies for targeted therapy such as PARP inhibitor. She agrees to genetics referral.  -Will see Genetic Counselor soon.  10. Peripheral neuropathy G1 -Likely secondary to DM. -on lyrica and 2400 mg Neurontin per PCP.  -Neuropathy stable while on gemcitabine. Vibratory sense to feet is moderately decreased. We reviewed if neuropathy worsens, may have to reduce or discontinue chemo.  -She can use tramadol or oxycodone for significant pain that impairs sleep PRN -Stable   11. Anemia  -Multifactorial, related to chemotherapy and possibly some degree of chronic disease.  -Hgb 8.5 today. Will monitor closely, does not need transfusion today.    PLAN: -will hold on chemo for now. Cancel future appointments   -Lab, flush and f/u in 4 weeks     No orders of the defined types  were placed in this encounter.   All questions were answered. The patient knows to call the clinic with any problems, questions or concerns. I spent 20 minutes counseling the patient face to face. The total time spent in the appointment was 25 minutes and more than 50% was on counseling.  This document serves as a record of services personally performed by Truitt Merle, MD. It was created on her behalf by Joslyn Devon, a trained medical scribe. The creation of this record is based on the scribe's personal observations and the provider's statements to them.    I have reviewed the above documentation for accuracy and completeness, and I agree with the above.     Truitt Merle, MD 05/11/2017

## 2017-05-10 ENCOUNTER — Encounter (HOSPITAL_COMMUNITY): Payer: PPO

## 2017-05-11 ENCOUNTER — Telehealth: Payer: Self-pay

## 2017-05-11 ENCOUNTER — Inpatient Hospital Stay: Payer: PPO

## 2017-05-11 ENCOUNTER — Inpatient Hospital Stay (HOSPITAL_BASED_OUTPATIENT_CLINIC_OR_DEPARTMENT_OTHER): Payer: PPO | Admitting: Hematology

## 2017-05-11 ENCOUNTER — Encounter: Payer: Self-pay | Admitting: Hematology

## 2017-05-11 VITALS — BP 141/58 | HR 67 | Temp 98.5°F | Resp 20 | Ht 65.0 in | Wt 211.8 lb

## 2017-05-11 DIAGNOSIS — E785 Hyperlipidemia, unspecified: Secondary | ICD-10-CM

## 2017-05-11 DIAGNOSIS — Z923 Personal history of irradiation: Secondary | ICD-10-CM

## 2017-05-11 DIAGNOSIS — D6481 Anemia due to antineoplastic chemotherapy: Secondary | ICD-10-CM

## 2017-05-11 DIAGNOSIS — L03115 Cellulitis of right lower limb: Secondary | ICD-10-CM | POA: Diagnosis not present

## 2017-05-11 DIAGNOSIS — R5383 Other fatigue: Secondary | ICD-10-CM

## 2017-05-11 DIAGNOSIS — K589 Irritable bowel syndrome without diarrhea: Secondary | ICD-10-CM

## 2017-05-11 DIAGNOSIS — Z7982 Long term (current) use of aspirin: Secondary | ICD-10-CM

## 2017-05-11 DIAGNOSIS — L03116 Cellulitis of left lower limb: Secondary | ICD-10-CM | POA: Diagnosis not present

## 2017-05-11 DIAGNOSIS — I700 Atherosclerosis of aorta: Secondary | ICD-10-CM

## 2017-05-11 DIAGNOSIS — I5030 Unspecified diastolic (congestive) heart failure: Secondary | ICD-10-CM

## 2017-05-11 DIAGNOSIS — E46 Unspecified protein-calorie malnutrition: Secondary | ICD-10-CM

## 2017-05-11 DIAGNOSIS — J90 Pleural effusion, not elsewhere classified: Secondary | ICD-10-CM

## 2017-05-11 DIAGNOSIS — Z79899 Other long term (current) drug therapy: Secondary | ICD-10-CM

## 2017-05-11 DIAGNOSIS — M5136 Other intervertebral disc degeneration, lumbar region: Secondary | ICD-10-CM

## 2017-05-11 DIAGNOSIS — Z5111 Encounter for antineoplastic chemotherapy: Secondary | ICD-10-CM

## 2017-05-11 DIAGNOSIS — C250 Malignant neoplasm of head of pancreas: Secondary | ICD-10-CM | POA: Diagnosis not present

## 2017-05-11 DIAGNOSIS — N390 Urinary tract infection, site not specified: Secondary | ICD-10-CM | POA: Diagnosis not present

## 2017-05-11 DIAGNOSIS — E119 Type 2 diabetes mellitus without complications: Secondary | ICD-10-CM

## 2017-05-11 DIAGNOSIS — I2510 Atherosclerotic heart disease of native coronary artery without angina pectoris: Secondary | ICD-10-CM

## 2017-05-11 DIAGNOSIS — IMO0001 Reserved for inherently not codable concepts without codable children: Secondary | ICD-10-CM

## 2017-05-11 DIAGNOSIS — I10 Essential (primary) hypertension: Secondary | ICD-10-CM

## 2017-05-11 DIAGNOSIS — I878 Other specified disorders of veins: Secondary | ICD-10-CM

## 2017-05-11 DIAGNOSIS — Z794 Long term (current) use of insulin: Secondary | ICD-10-CM

## 2017-05-11 DIAGNOSIS — E1140 Type 2 diabetes mellitus with diabetic neuropathy, unspecified: Secondary | ICD-10-CM | POA: Diagnosis not present

## 2017-05-11 DIAGNOSIS — R410 Disorientation, unspecified: Secondary | ICD-10-CM | POA: Diagnosis not present

## 2017-05-11 DIAGNOSIS — G2581 Restless legs syndrome: Secondary | ICD-10-CM

## 2017-05-11 DIAGNOSIS — Z9981 Dependence on supplemental oxygen: Secondary | ICD-10-CM

## 2017-05-11 DIAGNOSIS — R634 Abnormal weight loss: Secondary | ICD-10-CM

## 2017-05-11 DIAGNOSIS — I5032 Chronic diastolic (congestive) heart failure: Secondary | ICD-10-CM

## 2017-05-11 DIAGNOSIS — Z95828 Presence of other vascular implants and grafts: Secondary | ICD-10-CM

## 2017-05-11 DIAGNOSIS — I872 Venous insufficiency (chronic) (peripheral): Secondary | ICD-10-CM

## 2017-05-11 DIAGNOSIS — J439 Emphysema, unspecified: Secondary | ICD-10-CM

## 2017-05-11 DIAGNOSIS — G4730 Sleep apnea, unspecified: Secondary | ICD-10-CM

## 2017-05-11 LAB — COMPREHENSIVE METABOLIC PANEL
ALK PHOS: 272 U/L — AB (ref 40–150)
ALT: 23 U/L (ref 0–55)
ANION GAP: 9 (ref 3–11)
AST: 35 U/L — ABNORMAL HIGH (ref 5–34)
Albumin: 2.4 g/dL — ABNORMAL LOW (ref 3.5–5.0)
BILIRUBIN TOTAL: 0.4 mg/dL (ref 0.2–1.2)
BUN: 23 mg/dL (ref 7–26)
CALCIUM: 8.5 mg/dL (ref 8.4–10.4)
CO2: 26 mmol/L (ref 22–29)
CREATININE: 1.04 mg/dL (ref 0.60–1.10)
Chloride: 100 mmol/L (ref 98–109)
GFR calc Af Amer: >60 mL/min (ref >60)
GFR, EST NON AFRICAN AMERICAN: 53 mL/min — AB (ref >60)
Glucose, Bld: 306 mg/dL — ABNORMAL HIGH (ref 70–140)
Potassium: 4.4 mmol/L (ref 3.5–5.1)
Sodium: 135 mmol/L — ABNORMAL LOW (ref 136–145)
TOTAL PROTEIN: 6.1 g/dL — AB (ref 6.4–8.3)

## 2017-05-11 LAB — CBC WITH DIFFERENTIAL/PLATELET
Basophils Absolute: 0 10*3/uL (ref 0.0–0.1)
Basophils Relative: 0 %
EOS ABS: 0.1 10*3/uL (ref 0.0–0.5)
Eosinophils Relative: 2 %
HEMATOCRIT: 27.5 % — AB (ref 34.8–46.6)
HEMOGLOBIN: 8.5 g/dL — AB (ref 11.6–15.9)
LYMPHS ABS: 1.6 10*3/uL (ref 0.9–3.3)
LYMPHS PCT: 27 %
MCH: 26.9 pg (ref 25.1–34.0)
MCHC: 30.9 g/dL — ABNORMAL LOW (ref 31.5–36.0)
MCV: 87 fL (ref 79.5–101.0)
MONOS PCT: 16 %
Monocytes Absolute: 1 10*3/uL — ABNORMAL HIGH (ref 0.1–0.9)
NEUTROS ABS: 3.3 10*3/uL (ref 1.5–6.5)
NEUTROS PCT: 55 %
Platelets: 383 10*3/uL (ref 145–400)
RBC: 3.16 MIL/uL — AB (ref 3.70–5.45)
RDW: 21.7 % — ABNORMAL HIGH (ref 11.2–14.5)
WBC: 6 10*3/uL (ref 3.9–10.3)

## 2017-05-11 MED ORDER — SODIUM CHLORIDE 0.9% FLUSH
10.0000 mL | INTRAVENOUS | Status: DC | PRN
Start: 2017-05-11 — End: 2017-05-11
  Administered 2017-05-11: 10 mL via INTRAVENOUS
  Filled 2017-05-11: qty 10

## 2017-05-11 MED ORDER — HEPARIN SOD (PORK) LOCK FLUSH 100 UNIT/ML IV SOLN
500.0000 [IU] | Freq: Once | INTRAVENOUS | Status: AC | PRN
  Administered 2017-05-11: 500 [IU] via INTRAVENOUS
  Filled 2017-05-11: qty 5

## 2017-05-11 NOTE — Telephone Encounter (Signed)
Printed avs and calender of upcoming appointment. Per patient request day due to other scheduled appointments (1 week later). Per 3/21 los

## 2017-05-15 ENCOUNTER — Other Ambulatory Visit: Payer: Self-pay | Admitting: Internal Medicine

## 2017-05-18 ENCOUNTER — Other Ambulatory Visit: Payer: PPO

## 2017-05-18 ENCOUNTER — Ambulatory Visit: Payer: PPO | Admitting: Nurse Practitioner

## 2017-05-18 ENCOUNTER — Ambulatory Visit: Payer: PPO

## 2017-05-23 ENCOUNTER — Telehealth: Payer: Self-pay | Admitting: Internal Medicine

## 2017-05-23 NOTE — Telephone Encounter (Signed)
Called patient, husband answered the phone. Husband states that Melissa Myers is interested in getting home health. Husband states that wife was wondering why she was not getting any help and was wanting to know if she would qualify for home health.

## 2017-05-23 NOTE — Telephone Encounter (Signed)
Called patient back states that she will call us back if she decides to do the home health or not

## 2017-05-23 NOTE — Telephone Encounter (Signed)
I'm confused about this message. Is the request for home health for zahra peffley or for her mother

## 2017-05-23 NOTE — Telephone Encounter (Signed)
She would and the best resources to get to the house would be through hospice. Would she like [place] to set that up?

## 2017-05-23 NOTE — Telephone Encounter (Signed)
Copied from [place] [id number]. Topic: Quick Communication - See Telephone Encounter >> May 23, 2017 11:36 AM Cleaster Corin, NT wrote: CRM for notification. See Telephone encounter for: 04/02  Pt. Care giver Langley Gauss calling to see if pt. Would be able to get home health pts. Mother is also in the home but she has home health and pt. Would like to know if she would qualify for services.

## 2017-05-29 ENCOUNTER — Other Ambulatory Visit: Payer: Self-pay | Admitting: Internal Medicine

## 2017-05-29 ENCOUNTER — Encounter: Payer: Self-pay | Admitting: Genetics

## 2017-05-30 ENCOUNTER — Inpatient Hospital Stay: Payer: PPO | Attending: Hematology | Admitting: Genetics

## 2017-05-30 ENCOUNTER — Inpatient Hospital Stay: Payer: PPO

## 2017-05-30 DIAGNOSIS — Z800 Family history of malignant neoplasm of digestive organs: Secondary | ICD-10-CM

## 2017-05-30 DIAGNOSIS — G629 Polyneuropathy, unspecified: Secondary | ICD-10-CM | POA: Diagnosis not present

## 2017-05-30 DIAGNOSIS — D649 Anemia, unspecified: Secondary | ICD-10-CM | POA: Insufficient documentation

## 2017-05-30 DIAGNOSIS — I700 Atherosclerosis of aorta: Secondary | ICD-10-CM | POA: Diagnosis not present

## 2017-05-30 DIAGNOSIS — Z923 Personal history of irradiation: Secondary | ICD-10-CM | POA: Diagnosis not present

## 2017-05-30 DIAGNOSIS — Z79899 Other long term (current) drug therapy: Secondary | ICD-10-CM | POA: Insufficient documentation

## 2017-05-30 DIAGNOSIS — Z7183 Encounter for nonprocreative genetic counseling: Secondary | ICD-10-CM

## 2017-05-30 DIAGNOSIS — I509 Heart failure, unspecified: Secondary | ICD-10-CM | POA: Insufficient documentation

## 2017-05-30 DIAGNOSIS — I2510 Atherosclerotic heart disease of native coronary artery without angina pectoris: Secondary | ICD-10-CM | POA: Insufficient documentation

## 2017-05-30 DIAGNOSIS — C250 Malignant neoplasm of head of pancreas: Secondary | ICD-10-CM | POA: Diagnosis not present

## 2017-05-30 DIAGNOSIS — Z7982 Long term (current) use of aspirin: Secondary | ICD-10-CM | POA: Insufficient documentation

## 2017-05-30 DIAGNOSIS — Z9221 Personal history of antineoplastic chemotherapy: Secondary | ICD-10-CM | POA: Diagnosis not present

## 2017-05-30 DIAGNOSIS — M1990 Unspecified osteoarthritis, unspecified site: Secondary | ICD-10-CM | POA: Diagnosis not present

## 2017-05-30 DIAGNOSIS — K449 Diaphragmatic hernia without obstruction or gangrene: Secondary | ICD-10-CM | POA: Insufficient documentation

## 2017-05-30 DIAGNOSIS — K219 Gastro-esophageal reflux disease without esophagitis: Secondary | ICD-10-CM | POA: Diagnosis not present

## 2017-05-30 DIAGNOSIS — R197 Diarrhea, unspecified: Secondary | ICD-10-CM | POA: Insufficient documentation

## 2017-05-30 DIAGNOSIS — J432 Centrilobular emphysema: Secondary | ICD-10-CM | POA: Diagnosis not present

## 2017-05-30 DIAGNOSIS — E1140 Type 2 diabetes mellitus with diabetic neuropathy, unspecified: Secondary | ICD-10-CM | POA: Diagnosis not present

## 2017-05-30 DIAGNOSIS — Z808 Family history of malignant neoplasm of other organs or systems: Secondary | ICD-10-CM | POA: Diagnosis not present

## 2017-05-30 DIAGNOSIS — I10 Essential (primary) hypertension: Secondary | ICD-10-CM | POA: Insufficient documentation

## 2017-05-30 DIAGNOSIS — Z803 Family history of malignant neoplasm of breast: Secondary | ICD-10-CM

## 2017-05-30 DIAGNOSIS — Z8041 Family history of malignant neoplasm of ovary: Secondary | ICD-10-CM

## 2017-05-30 DIAGNOSIS — C251 Malignant neoplasm of body of pancreas: Secondary | ICD-10-CM | POA: Diagnosis not present

## 2017-05-30 LAB — CBC WITH DIFFERENTIAL/PLATELET
BASOS ABS: 0.1 10*3/uL (ref 0.0–0.1)
Basophils Relative: 1 %
EOS ABS: 0.1 10*3/uL (ref 0.0–0.5)
EOS PCT: 1 %
HCT: 33.6 % — ABNORMAL LOW (ref 34.8–46.6)
Hemoglobin: 10.6 g/dL — ABNORMAL LOW (ref 11.6–15.9)
Lymphocytes Relative: 26 %
Lymphs Abs: 1.8 10*3/uL (ref 0.9–3.3)
MCH: 26.5 pg (ref 25.1–34.0)
MCHC: 31.6 g/dL (ref 31.5–36.0)
MCV: 83.8 fL (ref 79.5–101.0)
Monocytes Absolute: 0.6 10*3/uL (ref 0.1–0.9)
Monocytes Relative: 8 %
NEUTROS PCT: 64 %
Neutro Abs: 4.4 10*3/uL (ref 1.5–6.5)
PLATELETS: 271 10*3/uL (ref 145–400)
RBC: 4.01 MIL/uL (ref 3.70–5.45)
RDW: 20.8 % — ABNORMAL HIGH (ref 11.2–14.5)
WBC: 6.9 10*3/uL (ref 3.9–10.3)

## 2017-05-30 LAB — COMPREHENSIVE METABOLIC PANEL
ALBUMIN: 2.8 g/dL — AB (ref 3.5–5.0)
ALT: 38 U/L (ref 0–55)
AST: 77 U/L — AB (ref 5–34)
Alkaline Phosphatase: 357 U/L — ABNORMAL HIGH (ref 40–150)
Anion gap: 12 — ABNORMAL HIGH (ref 3–11)
BUN: 30 mg/dL — AB (ref 7–26)
CHLORIDE: 99 mmol/L (ref 98–109)
CO2: 27 mmol/L (ref 22–29)
CREATININE: 1.22 mg/dL — AB (ref 0.60–1.10)
Calcium: 9.5 mg/dL (ref 8.4–10.4)
GFR calc Af Amer: 50 mL/min — ABNORMAL LOW (ref >60)
GFR calc non Af Amer: 43 mL/min — ABNORMAL LOW (ref >60)
GLUCOSE: 263 mg/dL — AB (ref 70–140)
Potassium: 4.5 mmol/L (ref 3.5–5.1)
SODIUM: 138 mmol/L (ref 136–145)
Total Bilirubin: 0.4 mg/dL (ref 0.2–1.2)
Total Protein: 7.7 g/dL (ref 6.4–8.3)

## 2017-05-31 ENCOUNTER — Encounter: Payer: Self-pay | Admitting: Genetics

## 2017-05-31 DIAGNOSIS — Z803 Family history of malignant neoplasm of breast: Secondary | ICD-10-CM | POA: Insufficient documentation

## 2017-05-31 DIAGNOSIS — Z800 Family history of malignant neoplasm of digestive organs: Secondary | ICD-10-CM | POA: Insufficient documentation

## 2017-05-31 DIAGNOSIS — Z8041 Family history of malignant neoplasm of ovary: Secondary | ICD-10-CM | POA: Insufficient documentation

## 2017-05-31 LAB — CANCER ANTIGEN 19-9: CAN 19-9: 43 U/mL — AB (ref 0–35)

## 2017-05-31 NOTE — Progress Notes (Signed)
REFERRING PROVIDER: Truitt Merle, MD [address] [place], [place] [address]  PRIMARY PROVIDER:  [name], MD  PRIMARY REASON FOR VISIT:  1. Malignant neoplasm of head of pancreas ([place])   2. Family history of stomach cancer   3. Family history of ovarian cancer   4. Family history of breast cancer      HISTORY OF PRESENT ILLNESS:   Melissa Myers, a 72 y.o. female, was seen for a [place] cancer genetics consultation at the request of Dr. Burr Medico due to a personal and family history of cancer.  Melissa Myers presents to clinic today to discuss the possibility of a hereditary predisposition to cancer, genetic testing, and to further clarify her future cancer risks, as well as potential cancer risks for family members.   In Jan 2018, at the age of 18, Melissa Myers was diagnosed with pancreatic cancer.  She had radiation therapy and chemotherapy.    CANCER HISTORY:  Oncology History   Cancer Staging Malignant neoplasm of head of pancreas [facility]) Staging form: Exocrine Pancreas, AJCC 8th Edition - Clinical stage from 04/28/2016: Stage IB (cT2, cN0, cM0) - Signed by Truitt Merle, MD on 02/01/2017       Malignant neoplasm of head of pancreas ([place])   02/2016 Initial Diagnosis    Malignant neoplasm of head of pancreas ([place])      03/15/2016 - 03/21/2016 Hospital Admission    Presents with nausea, vomiting, and jaundice   Patient was found hyperbilirubinemic with abnormal abdominal CT suggesting obstructive physiology. Referred for admission and evaluation.      03/15/2016 Imaging    CT Abdomen Pelvis w Contrast 1. Cholecystectomy with borderline biliary duct dilatation. 2. Pancreatic atrophy with moderate pancreatic duct dilatation, followed to the level of the duodenum. 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 Considerations include otherwise occult stricture, periampullary lesion, or main duct intraductal papillary mucinous neoplasm. Given the combination of borderline  biliary duct dilatation, moderate pancreatic duct dilatation, and the clinical history of jaundice, consider further evaluation with ERCP. If the patient is not a good ERCP candidate, MRCP with and without contrast would be another option. 3.  No acute process in the abdomen or pelvis. 4.  Tiny hiatal hernia. 5.  Aortic atherosclerosis.      03/16/2016 Imaging    MRI Abdomen 1. Hypoenhancing lesion in the uncinate process of the pancreas, better demonstrated by MR than on the recent CT scan. The dilated main pancreatic duct and dilated common bile duct abruptly terminate at the cranial margin of this lesion. MR imaging features are concerning for pancreatic adenocarcinoma. ERCP with endoscopic ultrasound would likely prove helpful to further evaluate. 2. No definite metastatic disease in the liver. No peripancreatic lymphadenopathy is identified. Of note, this exam is limited by marked motion degradation and small liver lesions could be obscured. CT scan earlier today demonstrated no peripancreatic lymphadenopathy.      03/18/2016 Procedure    ERCP 1. Malignant-appearing distal bile duct stricturestatus post ERCP with sphincterotomy and plastic biliary stent placement 2. Guidewire finding false lumen with subsequentinjection as described 3. Status post cholecystectomy  Recommendation: 1. Clear liquid diet 2. Unasyn 1.5 g every 6 hours 3. Standard post ERCP observation. Indomethacin suppositories given 4. Trend LFTs 5. Will need endoscopic ultrasound with biopsy with Dr. Ardis Hughs outpatient.      04/28/2016 Pathology Results    Endoscopic [place] and Biopsy FINE NEEDLE ASPIRATION, ENDOSCOPIC, PANCREAS UNCINATE (SPECIMEN 1 OF 1 COLLECTED 04/28/16): MALIGNANT CELLS CONSISTENT WITH ADENOCARCINOMA.  05/04/2016 -  Hospital Admission    Presents with fever, abdominal pain, malaise, confusion. She was treated for acute cholangitis.       05/05/2016 Imaging    CT Abdomen Pelvis w  Contrast Interval placement of a bile duct stent with some decompression of the bile ducts. Mild residual biliary dilatation. Pancreatic ductal dilatation and atrophy. Known pancreatic mass lesion is not well depicted at CT. No evidence of bowel obstruction or inflammation.      05/05/2016 Imaging    CT Head w/o Contrast 05/05/16 IMPRESSION: No acute intracranial pathology.      05/06/2016 Procedure    ERCP and Bilary stent placement  Imaging demonstrating removal of endoscopic common bile duct stent. The stent was not able to be replaced.       05/07/2016 Imaging    CT Chest wo Contrast  1. New small right and trace left pleural effusions. Mild cardiomegaly. 2. Borderline enlarged right hilar lymph node at 1 cm in short axis. 3. No definite findings of metastatic disease to the chest. 4. Centrilobular emphysema. 5. High density in the visualized part of the dorsal pancreatic duct, probably contrast medium. 6. Subacute healing fracture of the right seventh rib laterally. 7. Atherosclerosis, including the left anterior descending coronary artery.      05/07/2016 Procedure    On 05/07/16, percutaneous transhepatic cholangiogram and placement of a 37F internal/external biliary drain was performed by IR with aspirated purulent bile sent for culture. Bile culture grew pantoea species, enterococcus faecalis, viridans streptococcus.      07/11/2016 - 07/21/2016 Radiation Therapy    Diagnosis:   cT2N0M0  adenocarcinoma of the pancreas  Indication for treatment:  Curative  Radiation treatment dates:   07/11/16 - 07/21/16  Site/dose:   Pancreas: 33 Gy in 5 fractions  Beams/energy:   SBRT/SRT-VMAT // 10X-FFF Photon  Narrative: The patient tolerated radiation treatment relatively well. The patient complained of diarrhea during treatment. She was instructed to purchase Imodium.      08/2016 - 10/2016 Chemotherapy    She had chemotherapy consistent of gemcitabine and abraxine for 2-3  months under the care of Dr. Berneice Gandy at [facility] with her last treatment being about 10/2016.        02/20/2017 Imaging    CT CAP 02/20/17  IMPRESSION: 1. Response to therapy of the primary pancreatic head/uncinate process lesion. Please note that delineation of the tumor is difficult secondary to anatomic distortion from therapy. 2. No new or progressive disease identified. 3. Removal of biliary stent, with pneumobilia and borderline similar intrahepatic biliary duct dilatation. 4.  No acute process or evidence of metastatic disease in the chest. 5.  Possible constipation. 6. Coronary artery atherosclerosis. Aortic Atherosclerosis (ICD10-I70.0). 7.  Emphysema (ICD10-J43.9). 8. Pulmonary artery enlargement suggests pulmonary arterial hypertension.      02/22/2017 - 04/26/2017 Chemotherapy    Single agent Gemcitabine every 2-3 weeks on and 1 week off, starting 02/22/17. Stopped after cycle 6 on 04/26/17 due to low benefit and intolerance.      05/03/2017 Imaging    IMPRESSION: 1. Subjectively the pancreatic head and uncinate process mass appears similar to the prior exam. The lesion is associated with about 50% circumferential involvement of the SMA and SMV. No compelling findings of distant metastatic spread at this time. 2. Mild biliary dilatation extending down to the pancreatic head mass. Mild pneumobilia. 3. There is mildly increased infiltrative edema along the porta hepatis. 4. Other imaging findings of potential clinical significance: Prominent stool throughout the  colon favors constipation. Aortic Atherosclerosis (ICD10-I70.0). Coronary atherosclerosis. Mitral valve calcification. Mild cardiomegaly. Free osteochondral fragments in the right glenohumeral joint. Lower lumbar spondylosis and degenerative disc disease. 2 stable nonspecific hypodense splenic lesions. Emphysema (ICD10-J43.9).        HORMONAL RISK FACTORS:  First live birth at age 52.  Ovaries intact: yes.  Hysterectomy:  no.  Menopausal status: postmenopausal.  Colonoscopy: yes; 1-2 polyps, has gone every 10 years. Mammogram within the last year: goes every year or so. Number of breast biopsies: 0.  Past Medical History:  Diagnosis Date  . Anemia   . Cancer [facility])    Pancreatic  . Cellulitis    LOWER EXTREMITIES  . Chronic diarrhea    a/w nausea - felt related to IBS  . Deaf    left side only  . Diastolic CHF ([place])   . Disc degeneration, lumbar   . Family history of breast cancer   . Family history of ovarian cancer   . Family history of stomach cancer   . GERD (gastroesophageal reflux disease)   . Hyperlipidemia    hx rhabdo on statins  . Hypertension   . Neuropathy    feet, toes and fingers  . On home oxygen therapy    uses oxygen 2 liters min per [place] at night and prn during day  . OSA (obstructive sleep apnea)    05/2009 sleep study - refuses CPAP  . Osteoarthritis   . RLS (restless legs syndrome)   . Shortness of breath    chronic  . Stasis dermatitis   . Type II or unspecified type diabetes mellitus without mention of complication, not stated as uncontrolled    insulin dep    Past Surgical History:  Procedure Laterality Date  . BILIARY STENT PLACEMENT N/A 05/06/2016   Procedure: BILIARY STENT PLACEMENT;  Surgeon: Gatha Mayer, MD;  Location: [place];  Service: Endoscopy;  Laterality: N/A;  . CHOLECYSTECTOMY  1997  . COLONOSCOPY N/A 12/03/2012   Procedure: COLONOSCOPY;  Surgeon: Lafayette Dragon, MD;  Location: [facility];  Service: Endoscopy;  Laterality: N/A;  . ENDOSCOPIC RETROGRADE CHOLANGIOPANCREATOGRAPHY (ERCP) WITH PROPOFOL N/A 05/06/2016   Procedure: ENDOSCOPIC RETROGRADE CHOLANGIOPANCREATOGRAPHY (ERCP) WITH PROPOFOL;  Surgeon: Gatha Mayer, MD;  Location: [place];  Service: Endoscopy;  Laterality: N/A;  . ERCP N/A 03/18/2016   Procedure: ENDOSCOPIC RETROGRADE CHOLANGIOPANCREATOGRAPHY (ERCP);  Surgeon: Irene Shipper, MD;  Location[facility];  Service:  Endoscopy;  Laterality: N/A;  . EUS N/A 04/28/2016   Procedure: UPPER ENDOSCOPIC ULTRASOUND (EUS) LINEAR;  Surgeon: [name], MD;  Location: [facility];  Service: Endoscopy;  Laterality: N/A;  . IR CHOLANGIOGRAM EXISTING TUBE  06/09/2016  . IR GENERIC HISTORICAL  05/07/2016   IR BILIARY DRAIN PLACEMENT WITH CHOLANGIOGRAM 05/07/2016 Jacqulynn Cadet, MD [facility]-INTER[facility]  . IR GENERIC HISTORICAL  05/19/2016   IR BILIARY STENT(S) EXISTING ACCESS INC DILATION CATH EXCHANGE 05/19/2016 Jacqulynn Cadet, MD [facility]-INTERV RAD  . TONSILLECTOMY  1970  . TUBAL LIGATION  1980  . UMBILICAL HERNIA REPAIR  1995  . uterine polyp removal  2008    Social History   Socioeconomic History  . Marital status: Married    Spouse name: Not on file  . Number of children: Not on file  . Years of education: Not on file  . Highest education level: Not on file  Occupational History  . Not on file  Social Needs  . Financial resource strain: Not on file  . Food insecurity:  Worry: Not on file    Inability: Not on file  . Transportation needs:    Medical: Not on file    Non-medical: Not on file  Tobacco Use  . Smoking status: Never Smoker  . Smokeless tobacco: Never Used  Substance and Sexual Activity  . Alcohol use: No  . Drug use: No  . Sexual activity: Never  Lifestyle  . Physical activity:    Days per week: Not on file    Minutes per session: Not on file  . Stress: Not on file  Relationships  . Social connections:    Talks on phone: Not on file    Gets together: Not on file    Attends religious service: Not on file    Active member of club or organization: Not on file    Attends meetings of clubs or organizations: Not on file    Relationship status: Not on file  Other Topics Concern  . Not on file  Social History Narrative   Lives with spouse and mother. Retired Network engineer, now housewife     FAMILY HISTORY:  We obtained a detailed, 4-generation family history.  Significant diagnoses are  listed below: Family History  Problem Relation Age of Onset  . Heart disease Mother   . Heart attack Mother 2  . Heart disease Father   . Heart attack Father 69  . Heart disease Unknown        family history  . Stomach cancer Paternal Grandmother   . Lung cancer Paternal Grandfather   . CVA Unknown        several aunts  . Heart attack Unknown        several aunts and an uncle  . Colon cancer Neg Hx    Melissa Myers has 2 daughters and 1 son.  Her daughters are 22 and 11, and each have 1 child.  Melissa Myers son is 18 with no history of cancer and no children.  Melissa Myers has a 31 year-old brothers with no history of cancer.  He has a daughter and a son with no history of cancer.   Melissa Myers father died in his 23's and had cancer, type unk.  Melissa Myers has 4 paternal uncles.   One had a brain tumor, type of tumor unk- does not think it was cancerous.  Melissa Myers paternal grandfather had lung cancer >50, and her paternal grandmother had stomach cancer dx >50.  Melissa Myers mother is 55 with no history of cancer.  She had a hysterectomy.  Melissa Myers has 5 maternal aunts and 2 maternal uncles with no history of cancer. (also has 2 other aunts/uncles who died in early childhood).  One of Melissa Myers's maternal uncles had a daughter who recently was diagnosed with ovarian cancer at 7.  Melissa Myers maternal grandfather died in his 86's due to an aneurysm, and her maternal grandmother died at 68 with no history of cancer.   Melissa Myers is unaware of previous family history of genetic testing for hereditary cancer risks. Patient's maternal ancestors are of Caucasian descent, and paternal ancestors are of Caucasian descent. There is no reported Ashkenazi Jewish ancestry. There is no known consanguinity.  GENETIC COUNSELING ASSESSMENT: Melissa Myers is a 72 y.o. female with a personal and family history which is somewhat suggestive of a Hereditary Cancer  Predisposition Syndrome. We, therefore, discussed and recommended the following at today's visit.   DISCUSSION: We reviewed the characteristics, features and inheritance patterns of hereditary cancer  syndromes. We also discussed genetic testing, including the appropriate family members to test, the process of testing, insurance coverage and turn-around-time for results. We discussed the implications of a negative, positive and/or variant of uncertain significant result. We recommended Melissa Myers pursue genetic testing for the Common Hereditary Cancers Panel + Brain/Nervous System Panel gene panel. The Common Hereditary Cancer Panel offered by [facility] includes sequencing and/or deletion duplication testing of the following 47 genes: APC, ATM, AXIN2, BARD1, BMPR1A, BRCA1, BRCA2, BRIP1, CDH1, CDKN2A (p14ARF), CDKN2A (p16INK4a), CKD4, CHEK2, CTNNA1, DICER1, EPCAM (Deletion/duplication testing only), GREM1 (promoter region deletion/duplication testing only), KIT, MEN1, MLH1, MSH2, MSH3, MSH6, MUTYH, NBN, NF1, NHTL1, PALB2, PDGFRA, PMS2, POLD1, POLE, PTEN, RAD50, RAD51C, RAD51D, SDHB, SDHC, SDHD, SMAD4, SMARCA4. STK11, TP53, TSC1, TSC2, and VHL.  The following genes were evaluated for sequence changes only: SDHA and HOXB13 c.251G>A variant only.  [facility] Nervous System/Brain Cancer Panel Primary panel: ALK, APC, DICER1, EPCAM, HRAS, MEN1, MLH1, MSH2, MSH6, NF1, NF2, PHOX2B, PMS2, PRKAR1A, PTCH1, PTEN, RB1, SMARCA4, SMARCB1, SMARCE1, SUFU, TP53, TSC1, TSC2, VHL.  We discussed that only 5-10% of cancers are associated with a Hereditary cancer predisposition syndrome.  One of the most common hereditary cancer syndromes that increases pancreatic cancer risk is called Hereditary Breast and Ovarian Cancer (HBOC) syndrome.  This syndrome is caused by mutations in the BRCA1 and BRCA2 genes.  This syndrome increases an individual's lifetime risk to develop breast, ovarian, pancreatic, and other types of cancer.  She has a  family history of stomach cancer, so Lynch Syndrome is a consideration as well.  There are also many other cancer predisposition syndromes caused by mutations in several other genes.  We discussed that if she is found to have a mutation in one of these genes, it may impact future medical management recommendations such as increased cancer screenings and consideration of risk reducing surgeries.  A positive result could also have implications for the patient's family members.  In some cases, a positive result may also impact treatment decisions (ex: use of PARP-inhibitors).   A Negative result would mean we were unable to identify a hereditary component to her cancer, but does not rule out the possibility of a hereditary basis for her  cancer.  There could be mutations that are undetectable by current technology, or in genes not yet tested or identified to increase cancer risk.    We discussed the potential to find a Variant of Uncertain Significance or VUS.  These are variants that have not yet been identified as pathogenic or benign, and it is unknown if this variant is associated with increased cancer risk or if this is a normal finding.  Most VUS's are reclassified to benign or likely benign.   It should not be used to make medical management decisions. With time, we suspect the lab will determine the significance of any VUS's identified if any.   Based on Melissa Myers's personal and family history of cancer, she meets medical criteria for genetic testing. Despite that she meets criteria, she may still have an out of pocket cost. We discussed that if her out of pocket cost for testing is over $100, the laboratory will call and confirm whether she wants to proceed with testing.  If the out of pocket cost of testing is less than $100 she will be billed by the genetic testing laboratory.   PLAN: After considering the risks, benefits, and limitations, Melissa Myers  provided informed consent to pursue  genetic testing and the blood sample  was sent to [facility] for analysis of the Common Hereditary Cancers Panel + Nervous System/Brain Cancer Panel. Results should be available within approximately 2-3 weeks' time, at which point they will be disclosed by telephone to Ms. Martorano, as will any additional recommendations warranted by these results. Ms. Fincher will receive a summary of her genetic counseling visit and a copy of her results once available. This information will also be available in Epic. We encouraged Ms. Hakimian to remain in contact with cancer genetics annually so that we can continuously update the family history and inform her of any changes in cancer genetics and testing that may be of benefit for her family. Ms. Zuver questions were answered to her satisfaction today. Our contact information was provided should additional questions or concerns arise.  Based on Ms. Foronda's family history, we recommended her maternal and paternal relatives, especially those affected with cancer also consider, having genetic counseling and testing. Ms. Crimi will let us know if we can be of any assistance in coordinating genetic counseling and/or testing for this family member.   Lastly, we encouraged Ms. Aderhold to remain in contact with cancer genetics annually so that we can continuously update the family history and inform her of any changes in cancer genetics and testing that may be of benefit for this family.   Ms.  Pica questions were answered to her satisfaction today. Our contact information was provided should additional questions or concerns arise. Thank you for the referral and allowing [place] to share in the care of your patient.   Tana Felts, MS, [facility] Certified Genetic Counselor [email or web address] phone: [phone]  The patient was seen for a total of 40 minutes in face-to-face genetic counseling.  The patient was accompanied today by her  caregiver. This patient was discussed with Drs. Magrinat, Lindi Adie and/or Burr Medico who agrees with the above.

## 2017-06-01 ENCOUNTER — Ambulatory Visit (HOSPITAL_COMMUNITY)
Admission: RE | Admit: 2017-06-01 | Discharge: 2017-06-01 | Disposition: A | Discharge status: Home / Self Care | Payer: PPO | Source: Ambulatory Visit | Attending: Cardiology | Admitting: Cardiology

## 2017-06-01 ENCOUNTER — Other Ambulatory Visit (HOSPITAL_COMMUNITY): Payer: Self-pay | Admitting: *Deleted

## 2017-06-01 ENCOUNTER — Telehealth (HOSPITAL_COMMUNITY): Payer: Self-pay | Admitting: *Deleted

## 2017-06-01 DIAGNOSIS — I82401 Acute embolism and thrombosis of unspecified deep veins of right lower extremity: Secondary | ICD-10-CM

## 2017-06-01 NOTE — Progress Notes (Signed)
Right lower extremity venous duplex has been completed. Negative for obvious evidence of DVT. Results were faxed to Dr. Aundra Dubin.  06/01/17 3:36 PM Melissa Myers RVT

## 2017-06-01 NOTE — Telephone Encounter (Signed)
I called patient after receiving a voice message to return call. Patient was c/o a knot on the inside of her calf that is painful. I called patient to get more advice and was told she would call me back because EMS was there for her mother.

## 2017-06-01 NOTE — Telephone Encounter (Signed)
Should get [place] of knot.

## 2017-06-01 NOTE — Telephone Encounter (Signed)
Care take called back and reported patient has a hard,tender,warm knot on her right inner calf muscle. No redness. It is about 3-4 inches. They first noticed it about 2-3 days ago. Patients mother is being transported by EMS to [facility] ED so patient was hoping to be seen today. Per Nira Conn patient probably needs LEA dopplers but we need to confirm with her provider. Message routed to [place] for advice.

## 2017-06-07 ENCOUNTER — Other Ambulatory Visit: Payer: PPO

## 2017-06-07 ENCOUNTER — Ambulatory Visit: Payer: PPO | Admitting: Nurse Practitioner

## 2017-06-07 ENCOUNTER — Encounter: Payer: Self-pay | Admitting: *Deleted

## 2017-06-07 ENCOUNTER — Ambulatory Visit: Payer: Self-pay | Admitting: *Deleted

## 2017-06-07 NOTE — Telephone Encounter (Signed)
See Assessment below. Addendum to Assessment: Patient is a pancreatic cancer patient. Caregiver stated the oncologist has terminated treatment for now because it has worn so on Mrs. Alford Highland. She did not know if this was a temporary or permanent decision. While speaking, her blood sugar was 133, O2@2L  98%, pulse was 55-61. These vitals were 30 minutes after O2 applied. Mrs. Menge denies any chest pain and denies feeling SOB. She stated her mouth feels dry and thirsty. The caregiver has the patient drinking as we talk.  According to protocol, patient should be seen today. Mr. And Mrs. Kimball has expressed they want to hydrate her well at home this afternoon and monitor her O2 before having to bring her in.  Reason for Disposition . [1] MODERATE dizziness (e.g., interferes with normal activities) AND [2] has NOT been evaluated by physician for this  (Exception: dizziness caused by heat exposure, sudden standing, or poor fluid intake)  Additional Information . Commented on: All Negative - Go to ED Now (Notify PCP)    She has an order for O2 prn.  Answer Assessment - Initial Assessment Questions Patient's caregiver, Langley Gauss phoned in to report the patient has complained of dizziness-lightheadedness for 3 days now. She denies any vertigo. Stated it is constant and includes when she lies down and when sitting. She stated Mrs. Narvaez seems to be fatigued and sleeping more the last 3 days. She reports the patient seems slightly more confused than normal and had several watery on Monday and Tuesday. Tuesday she was given lomotil twice yesterday, today she has not had any stools. THe caregiver stated she is drinking less than usual.   Pulse is 60 and O2 sat is 91% on RA. Advised her to apply O2 at 2L. Pox is 98% on 2L and pulse is 61.  1. DESCRIPTION: "Describe your dizziness."     Just feels dizzy.  2. LIGHTHEADED: "Do you feel lightheaded?" (e.g., somewhat faint, woozy, weak upon standing)  Complains she feels dizzy while lying and sitting and walking. 3. VERTIGO: "Do you feel like either you or the room is spinning or tilting?" (i.e. vertigo)    no 4. SEVERITY: "How bad is it?"  "Do you feel like you are going to faint?" "Can you stand and walk?"   - MILD - walking normally   - MODERATE - interferes with normal activities (e.g., work, school)    - SEVERE - unable to stand, requires support to walk, feels like passing out now.    Moderate 5. ONSET:  "When did the dizziness begin?" Today is day 3 6. AGGRAVATING FACTORS: "Does anything make it worse?" (e.g., standing, change in head position)   Constant, can not say if different things make it worse.  7. HEART RATE: "Can you tell me your heart rate?" "How many beats in 15 seconds?"  (Note: not all patients can do this)      60 beats a minute 8. CAUSE: "What do you think is causing the dizziness?"     no 9. RECURRENT SYMPTOM: "Have you had dizziness before?" If so, ask: "When was the last time?" "What happened that time?" Occasional dizziness in the past but usually resolves. 10. OTHER SYMPTOMS: "Do you have any other symptoms?" (e.g., fever, chest pain, vomiting, diarrhea, bleeding)     no 11. PREGNANCY: "Is there any chance you are pregnant?" "When was your last menstrual period?"     no  Protocols used: DIZZINESS [facility]

## 2017-06-08 NOTE — Telephone Encounter (Signed)
Fine with me, if no resolution should have appointment.

## 2017-06-09 ENCOUNTER — Telehealth: Payer: Self-pay | Admitting: Genetics

## 2017-06-09 NOTE — Telephone Encounter (Signed)
Revealed negative genetic testing.  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   This normal result is reassuring and indicates that it is unlikely Melissa Myers's cancer is due to a hereditary cause.  However, genetic testing is not perfect, and cannot definitively rule out a hereditary cause.  It will be important for her to keep in contact with genetics to learn if any additional testing may be needed in the future.   We recommended her reltaives, especially those affected with cancer also have genetic testing, as there could still be a genetic cause for the cancer in Melissa Myers's family that she did not inherit and therefore was not found in her.

## 2017-06-11 NOTE — Progress Notes (Signed)
[place]  Telephone:([phone]) [phone] [phone]  Clinic Follow up Note   Patient Care Team: [name], MD as PCP - General (Internal Medicine) Rigoberto Noel, MD as Consulting Physician (Pulmonary Disease) Larey Dresser, MD as Consulting Physician (Cardiology) Megan Salon, MD as Consulting Physician (Gynecology) Kristeen Miss, MD as Consulting Physician (Neurosurgery) Earlie Server, MD (Orthopedic Surgery) Almedia Balls, MD (Orthopedic Surgery) Lafayette Dragon, MD (Inactive) (Gastroenterology) Renato Shin, MD (Endocrinology) Darleen Crocker, MD (Ophthalmology) 06/14/2017  SUMMARY OF ONCOLOGIC HISTORY: Oncology History   Cancer Staging Malignant neoplasm of head of pancreas [facility]) Staging form: Exocrine Pancreas, AJCC 8th Edition - Clinical stage from 04/28/2016: Stage IB (cT2, cN0, cM0) - Signed by Truitt Merle, MD on 02/01/2017       Malignant neoplasm of head of pancreas ([place])   02/2016 Initial Diagnosis    Malignant neoplasm of head of pancreas ([place])      03/15/2016 - 03/21/2016 Hospital Admission    Presents with nausea, vomiting, and jaundice   Patient was found hyperbilirubinemic with abnormal abdominal CT suggesting obstructive physiology. Referred for admission and evaluation.      03/15/2016 Imaging    CT Abdomen Pelvis w Contrast 1. Cholecystectomy with borderline biliary duct dilatation. 2. Pancreatic atrophy with moderate pancreatic duct dilatation, followed to the level of the duodenum. 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 Considerations include otherwise occult stricture, periampullary lesion, or main duct intraductal papillary mucinous neoplasm. Given the combination of borderline biliary duct dilatation, moderate pancreatic duct dilatation, and the clinical history of jaundice, consider further evaluation with ERCP. If the patient is not a good ERCP candidate, MRCP with and without contrast would be another  option. 3.  No acute process in the abdomen or pelvis. 4.  Tiny hiatal hernia. 5.  Aortic atherosclerosis.      03/16/2016 Imaging    MRI Abdomen 1. Hypoenhancing lesion in the uncinate process of the pancreas, better demonstrated by MR than on the recent CT scan. The dilated main pancreatic duct and dilated common bile duct abruptly terminate at the cranial margin of this lesion. MR imaging features are concerning for pancreatic adenocarcinoma. ERCP with endoscopic ultrasound would likely prove helpful to further evaluate. 2. No definite metastatic disease in the liver. No peripancreatic lymphadenopathy is identified. Of note, this exam is limited by marked motion degradation and small liver lesions could be obscured. CT scan earlier today demonstrated no peripancreatic lymphadenopathy.      03/18/2016 Procedure    ERCP 1. Malignant-appearing distal bile duct stricturestatus post ERCP with sphincterotomy and plastic biliary stent placement 2. Guidewire finding false lumen with subsequentinjection as described 3. Status post cholecystectomy  Recommendation: 1. Clear liquid diet 2. Unasyn 1.5 g every 6 hours 3. Standard post ERCP observation. Indomethacin suppositories given 4. Trend LFTs 5. Will need endoscopic ultrasound with biopsy with Dr. Ardis Hughs outpatient.      04/28/2016 Pathology Results    Endoscopic [place] and Biopsy FINE NEEDLE ASPIRATION, ENDOSCOPIC, PANCREAS UNCINATE (SPECIMEN 1 OF 1 COLLECTED 04/28/16): MALIGNANT CELLS CONSISTENT WITH ADENOCARCINOMA.      05/04/2016 -  Hospital Admission    Presents with fever, abdominal pain, malaise, confusion. She was treated for acute cholangitis.       05/05/2016 Imaging    CT Abdomen Pelvis w Contrast Interval placement of a bile duct stent with some decompression of the bile ducts. Mild residual biliary dilatation. Pancreatic ductal dilatation and atrophy. Known pancreatic mass lesion is not well depicted at CT. No  evidence  of bowel obstruction or inflammation.      05/05/2016 Imaging    CT Head w/o Contrast 05/05/16 IMPRESSION: No acute intracranial pathology.      05/06/2016 Procedure    ERCP and Bilary stent placement  Imaging demonstrating removal of endoscopic common bile duct stent. The stent was not able to be replaced.       05/07/2016 Imaging    CT Chest wo Contrast  1. New small right and trace left pleural effusions. Mild cardiomegaly. 2. Borderline enlarged right hilar lymph node at 1 cm in short axis. 3. No definite findings of metastatic disease to the chest. 4. Centrilobular emphysema. 5. High density in the visualized part of the dorsal pancreatic duct, probably contrast medium. 6. Subacute healing fracture of the right seventh rib laterally. 7. Atherosclerosis, including the left anterior descending coronary artery.      05/07/2016 Procedure    On 05/07/16, percutaneous transhepatic cholangiogram and placement of a 57F internal/external biliary drain was performed by IR with aspirated purulent bile sent for culture. Bile culture grew pantoea species, enterococcus faecalis, viridans streptococcus.      07/11/2016 - 07/21/2016 Radiation Therapy    Diagnosis:   cT2N0M0  adenocarcinoma of the pancreas  Indication for treatment:  Curative  Radiation treatment dates:   07/11/16 - 07/21/16  Site/dose:   Pancreas: 33 Gy in 5 fractions  Beams/energy:   SBRT/SRT-VMAT // 10X-FFF Photon  Narrative: The patient tolerated radiation treatment relatively well. The patient complained of diarrhea during treatment. She was instructed to purchase Imodium.      08/2016 - 10/2016 Chemotherapy    She had chemotherapy consistent of gemcitabine and abraxine for 2-3 months under the care of Dr. Berneice Gandy at [facility] with her last treatment being about 10/2016.        02/20/2017 Imaging    CT CAP 02/20/17  IMPRESSION: 1. Response to therapy of the primary pancreatic head/uncinate process lesion. Please  note that delineation of the tumor is difficult secondary to anatomic distortion from therapy. 2. No new or progressive disease identified. 3. Removal of biliary stent, with pneumobilia and borderline similar intrahepatic biliary duct dilatation. 4.  No acute process or evidence of metastatic disease in the chest. 5.  Possible constipation. 6. Coronary artery atherosclerosis. Aortic Atherosclerosis (ICD10-I70.0). 7.  Emphysema (ICD10-J43.9). 8. Pulmonary artery enlargement suggests pulmonary arterial hypertension.      02/22/2017 - 04/26/2017 Chemotherapy    Single agent Gemcitabine every 2-3 weeks on and 1 week off, starting 02/22/17. Stopped after cycle 6 on 04/26/17 due to low benefit and intolerance.      05/03/2017 Imaging    IMPRESSION: 1. Subjectively the pancreatic head and uncinate process mass appears similar to the prior exam. The lesion is associated with about 50% circumferential involvement of the SMA and SMV. No compelling findings of distant metastatic spread at this time. 2. Mild biliary dilatation extending down to the pancreatic head mass. Mild pneumobilia. 3. There is mildly increased infiltrative edema along the porta hepatis. 4. Other imaging findings of potential clinical significance: Prominent stool throughout the colon favors constipation. Aortic Atherosclerosis (ICD10-I70.0). Coronary atherosclerosis. Mitral valve calcification. Mild cardiomegaly. Free osteochondral fragments in the right glenohumeral joint. Lower lumbar spondylosis and degenerative disc disease. 2 stable nonspecific hypodense splenic lesions. Emphysema (ICD10-J43.9).     CURRENT THERAPY: observation  INTERVAL HISTORY: Ms. Stopper returns for follow up as scheduled for observation. She presents with one of her part-time caregivers. She reports being active at home, ambulates  with a walker, but does not leave the house often. She continues to report pain in her legs with swelling. She has had several  recent episodes of diarrhea over the last few weeks. She is eating less during the day but "binge eats" at night per caregiver. She does not drink well. BG has been uncontrolled, reportedly 600 this morning. She has been intermittently dizzy over the last week. Caregiver called nurse line who recommended she check oxygen, sat was 90% at that time. She uses O2 PRN at home. No fever, chills, cough, chest pain, or dyspnea. Memory is getting worse. Has ongoing difficulty sleeping. She denies abdominal or back pain or bloating.   REVIEW OF SYSTEMS:   Constitutional: Denies fevers, chills or abnormal weight loss (+) low activity tolerance  Eyes: Denies blurriness of vision Ears, nose, mouth, throat, and face: Denies mucositis or sore throat Respiratory: Denies cough, dyspnea or wheezes (+) O2 PRN Cardiovascular: Denies palpitation, chest discomfort (+) baseline lower extremity swelling Gastrointestinal:  Denies nausea, vomiting, constipation, abdominal pain, bloating, heartburn (+) periodic diarrhea  Skin: Denies abnormal skin rashes Lymphatics: Denies new lymphadenopathy or easy bruising Neurological:Denies numbness, tingling or new weaknesses (+) intermittent dizziness (+) worsening memory  MSK: (+) leg pain  Behavioral/Psych: Mood is stable, no new changes (+) difficulty sleeping  All other systems were reviewed with the patient and are negative.  MEDICAL HISTORY:  Past Medical History:  Diagnosis Date  . Anemia   . Cancer [facility])    Pancreatic  . Cellulitis    LOWER EXTREMITIES  . Chronic diarrhea    a/w nausea - felt related to IBS  . Deaf    left side only  . Diastolic CHF ([place])   . Disc degeneration, lumbar   . Family history of breast cancer   . Family history of ovarian cancer   . Family history of stomach cancer   . GERD (gastroesophageal reflux disease)   . Hyperlipidemia    hx rhabdo on statins  . Hypertension   . Neuropathy    feet, toes and fingers  . On home oxygen  therapy    uses oxygen 2 liters min per [place] at night and prn during day  . OSA (obstructive sleep apnea)    05/2009 sleep study - refuses CPAP  . Osteoarthritis   . RLS (restless legs syndrome)   . Shortness of breath    chronic  . Stasis dermatitis   . Type II or unspecified type diabetes mellitus without mention of complication, not stated as uncontrolled    insulin dep    SURGICAL HISTORY: Past Surgical History:  Procedure Laterality Date  . BILIARY STENT PLACEMENT N/A 05/06/2016   Procedure: BILIARY STENT PLACEMENT;  Surgeon: Gatha Mayer, MD;  Location: [place];  Service: Endoscopy;  Laterality: N/A;  . CHOLECYSTECTOMY  1997  . COLONOSCOPY N/A 12/03/2012   Procedure: COLONOSCOPY;  Surgeon: Lafayette Dragon, MD;  Location: [facility];  Service: Endoscopy;  Laterality: N/A;  . ENDOSCOPIC RETROGRADE CHOLANGIOPANCREATOGRAPHY (ERCP) WITH PROPOFOL N/A 05/06/2016   Procedure: ENDOSCOPIC RETROGRADE CHOLANGIOPANCREATOGRAPHY (ERCP) WITH PROPOFOL;  Surgeon: Gatha Mayer, MD;  Location: [place];  Service: Endoscopy;  Laterality: N/A;  . ERCP N/A 03/18/2016   Procedure: ENDOSCOPIC RETROGRADE CHOLANGIOPANCREATOGRAPHY (ERCP);  Surgeon: Irene Shipper, MD;  Location: [facility];  Service: Endoscopy;  Laterality: N/A;  . EUS N/A 04/28/2016   Procedure: UPPER ENDOSCOPIC ULTRASOUND (EUS) LINEAR;  Surgeon: [name], MD;  Location: [facility];  Service:  Endoscopy;  Laterality: N/A;  . IR CHOLANGIOGRAM EXISTING TUBE  06/09/2016  . IR GENERIC HISTORICAL  05/07/2016   IR BILIARY DRAIN PLACEMENT WITH CHOLANGIOGRAM 05/07/2016 Jacqulynn Cadet, MD [facility]INTER[facility]  . IR GENERIC HISTORICAL  05/19/2016   IR BILIARY STENT(S) EXISTING ACCESS INC DILATION CATH EXCHANGE 05/19/2016 Jacqulynn Cadet, MD [facility]-INTER[facility] RAD  . TONSILLECTOMY  1970  . TUBAL LIGATION  1980  . UMBILICAL HERNIA REPAIR  1995  . uterine polyp removal  2008    I have reviewed the social history and family history with the patient  and they are unchanged from previous note.  ALLERGIES:  is allergic to morphine; statins; sulfa antibiotics; cymbalta [duloxetine hcl]; sulfasalazine; levemir [insulin detemir]; and zinc.  MEDICATIONS:  Current Outpatient Medications  Medication Sig Dispense Refill  . aspirin EC 81 MG tablet Take 1 tablet (81 mg total) by mouth daily.    . baclofen (LIORESAL) 10 MG tablet Take 0.5 tablets (5 mg total) by mouth 3 (three) times daily. 30 each 0  . carvedilol (COREG) 25 MG tablet TAKE ONE TABLET TWICE DAILY WITH FOOD 60 tablet 5  . Cholecalciferol (VITAMIN D3) 2000 units capsule Take 1 capsule (2,000 Units total) by mouth daily. 90 capsule 3  . Continuous Blood Gluc Sensor (FREESTYLE LIBRE SENSOR SYSTEM) MISC Use to check sugars 1 each 0  . diphenoxylate-atropine (LOMOTIL) 2.5-0.025 MG tablet Take 1-2 tablets by mouth 4 (four) times daily as needed for diarrhea or loose stools. 120 tablet 2  . gabapentin (NEURONTIN) 300 MG capsule TAKE TWO CAPSULES FOUR TIMES DAILY 240 capsule 1  . hydrALAZINE (APRESOLINE) 25 MG tablet TAKE ONE TABLET THREE TIMES DAILY 90 tablet 3  . insulin aspart (NOVOLOG) 100 UNIT/ML injection Inject 0-20 Units into the skin 3 (three) times daily with meals. . CBG 70 - 120: 0 units CBG 121 - 150: 3 units CBG 151 - 200: 4 units CBG 201 - 250: 7 units CBG 251 - 300: 11 units CBG 301 - 350: 15 units CBG 351 - 400: 20 units    . insulin glargine (LANTUS) 100 UNIT/ML injection Inject 0.3 mLs (30 Units total) into the skin daily.    [name] lactulose (CHRONULAC) 10 GM/15ML solution Take 10 g by mouth daily as needed for mild constipation.    . lidocaine (LIDODERM) 5 % Place 1 patch daily onto the skin. Remove & Discard patch within 12 hours or as directed by MD 30 patch 0  . lidocaine-prilocaine (EMLA) cream Apply to port site prior to being accessed    . lipase/protease/amylase (CREON) 36000 UNITS CPEP capsule 2 po prior to snacks; 4 po prior to meals    . LYRICA 75 MG capsule TAKE  ONE CAPSULE EACH DAY 30 capsule 3  . meclizine (ANTIVERT) 25 MG tablet Take 25 mg 2 (two) times daily as needed by mouth for dizziness.     . metFORMIN (GLUCOPHAGE) 500 MG tablet Take 500 mg by mouth daily with breakfast.    . mirtazapine (REMERON) 30 MG tablet Take 1 tablet (30 mg total) by mouth at bedtime. 30 tablet 2  . mupirocin ointment (BACTROBAN) 2 % Apply 1 application topically daily. 90 g 0  . ondansetron (ZOFRAN) 8 MG tablet Take 1 tablet (8 mg total) by mouth every 8 (eight) hours as needed for nausea or vomiting. 30 tablet 2  . ONETOUCH VERIO test strip USE AS INSTRUCTED UP TO 4 TIMES DAILY 200 each 11  . oxyCODONE (OXY IR/ROXICODONE) 5 MG immediate release  tablet Take 1 tablet (5 mg total) by mouth 2 (two) times daily as needed for severe pain. 60 tablet 0  . potassium chloride SA (K-DUR,KLOR-CON) 20 MEQ tablet Take 20 mEq by mouth daily.    [name] rOPINIRole (REQUIP XL) 4 MG 24 hr tablet TAKE ONE TABLET AT BEDTIME 30 tablet 5  . rOPINIRole (REQUIP) 2 MG tablet Take 1 tablet (2 mg total) by mouth 3 (three) times daily. 90 tablet 2  . sertraline (ZOLOFT) 50 MG tablet Take 50 mg by mouth daily.    . silver sulfADIAZINE (SILVADENE) 1 % cream Apply 1 application daily topically. 50 g 0  . spironolactone (ALDACTONE) 25 MG tablet Take 1 tablet (25 mg total) by mouth daily. 90 tablet 3  . torsemide (DEMADEX) 100 MG tablet Take 0.5 tablets (50 mg total) by mouth daily. Take 100 mg (1 whole tablet) on chemo days and as needed for swelling/weight gain. 90 tablet 3  . traMADol (ULTRAM) 50 MG tablet Take 1 tablet (50 mg total) by mouth every 6 (six) hours as needed. (Patient taking differently: Take 50 mg every 6 (six) hours as needed by mouth for moderate pain. ) 60 tablet 0   Current Facility-Administered Medications  Medication Dose Route Frequency Provider Last Rate Last Dose  . 0.9 %  sodium chloride infusion   Intravenous Once Alla Feeling, NP       Facility-Administered Medications  Ordered in Other Visits  Medication Dose Route Frequency Provider Last Rate Last Dose  . ondansetron (ZOFRAN) injection 8 mg  8 mg Intravenous Once Cira Rue K, NP      . sodium chloride flush (NS) 0.9 % injection 10 mL  10 mL Intracatheter PRN Truitt Merle, MD   10 mL at 02/22/17 1729    PHYSICAL EXAMINATION: ECOG PERFORMANCE STATUS: 3 - Symptomatic, >50% confined to bed  Vitals:   06/14/17 1148  BP: (!) 143/57  Pulse: 63  Resp: 18  Temp: 98.4 F (36.9 C)  SpO2: 100%   Filed Weights   06/14/17 1148  Weight: 203 lb (92.1 kg)    GENERAL: no distress. Appears tired, presents in wheelchair  SKIN: no rashes or significant lesions EYES: normal, Conjunctiva are pink and non-injected, sclera clear OROPHARYNX: dry oral mucosa  LYMPH:  no palpable cervical or supraclavicular lymphadenopathy LUNGS: clear to auscultation with normal breathing effort HEART: regular rate & rhythm and no murmurs (+) 2+ lower extremity edema, L>R with chronic venous stasis changes ABDOMEN:abdomen soft, non-tender and normal bowel sounds Musculoskeletal:no cyanosis of digits and no clubbing  NEURO: alert & oriented x 3 with fluent speech PAC without erythema   LABORATORY DATA:  I have reviewed the data as listed CBC Latest Ref Rng & Units 06/14/2017 05/30/2017 05/11/2017  WBC 3.9 - 10.3 K/uL 6.1 6.9 6.0  Hemoglobin 11.6 - 15.9 g/dL 10.4(L) 10.6(L) 8.5(L)  Hematocrit 34.8 - 46.6 % 32.3(L) 33.6(L) 27.5(L)  Platelets 145 - 400 K/uL 285 271 383     CMP Latest Ref Rng & Units 06/14/2017 05/30/2017 05/11/2017  Glucose 70 - 140 mg/dL 307(H) 263(H) 306(H)  BUN 7 - 26 mg/dL 41(H) 30(H) 23  Creatinine 0.60 - 1.10 mg/dL 1.29(H) 1.22(H) 1.04  Sodium 136 - 145 mmol/L 138 138 135(L)  Potassium 3.5 - 5.1 mmol/L 3.7 4.5 4.4  Chloride 98 - 109 mmol/L 99 99 100  CO2 22 - 29 mmol/L 25 27 26   Calcium 8.4 - 10.4 mg/dL 9.9 9.5 8.5  Total Protein 6.4 - 8.3  g/dL 7.7 7.7 6.1(L)  Total Bilirubin 0.2 - 1.2 mg/dL 0.3 0.4 0.4    Alkaline Phos 40 - 150 U/L 285(H) 357(H) 272(H)  AST 5 - 34 U/L 21 77(H) 35(H)  ALT 0 - 55 U/L 16 38 23    CA 19-9:  03/17/16: 107 02/01/17: 13 03/01/17: 15 03/28/17: 18 04/26/17: 21 05/11/17: 18 05/30/17: 43 06/14/17: PENDING       RADIOGRAPHIC STUDIES: I have personally reviewed the radiological images as listed and agreed with the findings in the report. No results found.   ASSESSMENT & PLAN: 72 y.o.female with PMH of IDDM with neuropathy, HFpEF, HTN, OSA on intermittent home oxygen, and recently diagnosed pancreatic mass s/p biliary stent 04/28/2016 who was recently treated for acute cholangitis, status post cutaneous biliary drainage. She had biliary drainage tube exchange with peri-stent placement by IR Dr. Laurence Ferrari today  1. Pancreatic adenocarcinoma, borderline resectable, cT2N0M0, stage IB 2. Bilateral LE cellulitis and edema 3. Diarrhea, possibly related to pancreatic insufficiency 4. DM, morbid obesity  5. Chronic diastolic heart failure 6. Malnutrition and deconditioning  7. Chronic venous stasis  8. Goals of care discussion 9. 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 Peripheral neuropathy, G1 11. Anemia 12. Dizziness   Ms. Grulke appears stable today. She remains on observation for her pancreatic cancer. She continues to have intermittent diarrhea, possibly related to pancreatic insufficiency, on pancreatic enzymes. She has been intermittently dizzy, which for her is multifactorial. BG is poorly controlled, 600 at home today and 307 in clinic. Creatinine is slightly increased, Cr 1.29 today. She appears dehydrated especially given her poor oral intake with recent diarrhea. Her fluid status and weight fluctuate, but overall stable. I plan to give 500 cc NS today over 1 hour. I encouraged her to increase liquid intake and try to get tighter control of BG. Elevated creatinine may also be related to poor DM control. Labs otherwise stable. Hgb improved to 10.4 , improved while  off chemotherapy. Alk phos fluctuates but overall stable. CA 19-9 was elevated on 05/30/17 to 43, previously normal. Level is pending today. Will follow closely. Clinically she has no abdominal or back pain or juandice; continue observation.  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 No BRCA mutation. She is not a candidate for PARP inhibitor. I discussed this with the patient.   Plan to f/u in 4 weeks or sooner if she has new or worsening concerns.   PLAN: -500 cc NS today over 1 hour for renal dysfunction, dizziness, hyperglycemia, recent diarrhea  -CA 19-9 pending -F/u 4 weeks with Dr. Burr Medico   All questions were answered. The patient knows to call the clinic with any problems, questions or concerns. No barriers to learning was detected. I spent 20 minutes counseling the patient face to face. The total time spent in the appointment was 25 minutes and more than 50% was on counseling and review of test results     Alla Feeling, NP 06/14/17

## 2017-06-14 ENCOUNTER — Encounter: Payer: Self-pay | Admitting: Nurse Practitioner

## 2017-06-14 ENCOUNTER — Inpatient Hospital Stay: Payer: PPO

## 2017-06-14 ENCOUNTER — Inpatient Hospital Stay (HOSPITAL_BASED_OUTPATIENT_CLINIC_OR_DEPARTMENT_OTHER): Payer: PPO | Admitting: Nurse Practitioner

## 2017-06-14 VITALS — BP 143/57 | HR 63 | Temp 98.4°F | Resp 18 | Ht 65.0 in | Wt 203.0 lb

## 2017-06-14 DIAGNOSIS — E1122 Type 2 diabetes mellitus with diabetic chronic kidney disease: Secondary | ICD-10-CM | POA: Diagnosis not present

## 2017-06-14 DIAGNOSIS — Z794 Long term (current) use of insulin: Secondary | ICD-10-CM

## 2017-06-14 DIAGNOSIS — M4807 Spinal stenosis, lumbosacral region: Secondary | ICD-10-CM | POA: Diagnosis not present

## 2017-06-14 DIAGNOSIS — M5489 Other dorsalgia: Secondary | ICD-10-CM | POA: Diagnosis not present

## 2017-06-14 DIAGNOSIS — I700 Atherosclerosis of aorta: Secondary | ICD-10-CM | POA: Diagnosis not present

## 2017-06-14 DIAGNOSIS — I10 Essential (primary) hypertension: Secondary | ICD-10-CM

## 2017-06-14 DIAGNOSIS — T428X5A Adverse effect of antiparkinsonism drugs and other central muscle-tone depressants, initial encounter: Secondary | ICD-10-CM | POA: Diagnosis not present

## 2017-06-14 DIAGNOSIS — G92 Toxic encephalopathy: Secondary | ICD-10-CM | POA: Diagnosis not present

## 2017-06-14 DIAGNOSIS — G629 Polyneuropathy, unspecified: Secondary | ICD-10-CM | POA: Diagnosis not present

## 2017-06-14 DIAGNOSIS — R197 Diarrhea, unspecified: Secondary | ICD-10-CM

## 2017-06-14 DIAGNOSIS — K449 Diaphragmatic hernia without obstruction or gangrene: Secondary | ICD-10-CM | POA: Diagnosis not present

## 2017-06-14 DIAGNOSIS — N189 Chronic kidney disease, unspecified: Secondary | ICD-10-CM | POA: Diagnosis not present

## 2017-06-14 DIAGNOSIS — M48061 Spinal stenosis, lumbar region without neurogenic claudication: Secondary | ICD-10-CM | POA: Diagnosis not present

## 2017-06-14 DIAGNOSIS — D631 Anemia in chronic kidney disease: Secondary | ICD-10-CM | POA: Diagnosis not present

## 2017-06-14 DIAGNOSIS — M549 Dorsalgia, unspecified: Secondary | ICD-10-CM | POA: Diagnosis not present

## 2017-06-14 DIAGNOSIS — I130 Hypertensive heart and chronic kidney disease with heart failure and stage 1 through stage 4 chronic kidney disease, or unspecified chronic kidney disease: Secondary | ICD-10-CM | POA: Diagnosis not present

## 2017-06-14 DIAGNOSIS — Z7982 Long term (current) use of aspirin: Secondary | ICD-10-CM

## 2017-06-14 DIAGNOSIS — M79662 Pain in left lower leg: Secondary | ICD-10-CM | POA: Diagnosis not present

## 2017-06-14 DIAGNOSIS — I872 Venous insufficiency (chronic) (peripheral): Secondary | ICD-10-CM | POA: Diagnosis not present

## 2017-06-14 DIAGNOSIS — Z803 Family history of malignant neoplasm of breast: Secondary | ICD-10-CM

## 2017-06-14 DIAGNOSIS — Z79899 Other long term (current) drug therapy: Secondary | ICD-10-CM

## 2017-06-14 DIAGNOSIS — C250 Malignant neoplasm of head of pancreas: Secondary | ICD-10-CM | POA: Diagnosis not present

## 2017-06-14 DIAGNOSIS — I129 Hypertensive chronic kidney disease with stage 1 through stage 4 chronic kidney disease, or unspecified chronic kidney disease: Secondary | ICD-10-CM | POA: Diagnosis not present

## 2017-06-14 DIAGNOSIS — I509 Heart failure, unspecified: Secondary | ICD-10-CM | POA: Diagnosis not present

## 2017-06-14 DIAGNOSIS — Z923 Personal history of irradiation: Secondary | ICD-10-CM

## 2017-06-14 DIAGNOSIS — Z8041 Family history of malignant neoplasm of ovary: Secondary | ICD-10-CM

## 2017-06-14 DIAGNOSIS — E1129 Type 2 diabetes mellitus with other diabetic kidney complication: Secondary | ICD-10-CM | POA: Diagnosis not present

## 2017-06-14 DIAGNOSIS — E1165 Type 2 diabetes mellitus with hyperglycemia: Secondary | ICD-10-CM | POA: Diagnosis not present

## 2017-06-14 DIAGNOSIS — M5416 Radiculopathy, lumbar region: Secondary | ICD-10-CM | POA: Diagnosis not present

## 2017-06-14 DIAGNOSIS — R011 Cardiac murmur, unspecified: Secondary | ICD-10-CM | POA: Diagnosis not present

## 2017-06-14 DIAGNOSIS — Z9221 Personal history of antineoplastic chemotherapy: Secondary | ICD-10-CM

## 2017-06-14 DIAGNOSIS — J432 Centrilobular emphysema: Secondary | ICD-10-CM | POA: Diagnosis not present

## 2017-06-14 DIAGNOSIS — E119 Type 2 diabetes mellitus without complications: Secondary | ICD-10-CM

## 2017-06-14 DIAGNOSIS — E785 Hyperlipidemia, unspecified: Secondary | ICD-10-CM | POA: Diagnosis not present

## 2017-06-14 DIAGNOSIS — K219 Gastro-esophageal reflux disease without esophagitis: Secondary | ICD-10-CM

## 2017-06-14 DIAGNOSIS — M19072 Primary osteoarthritis, left ankle and foot: Secondary | ICD-10-CM | POA: Diagnosis not present

## 2017-06-14 DIAGNOSIS — E669 Obesity, unspecified: Secondary | ICD-10-CM | POA: Diagnosis not present

## 2017-06-14 DIAGNOSIS — T402X5A Adverse effect of other opioids, initial encounter: Secondary | ICD-10-CM | POA: Diagnosis not present

## 2017-06-14 DIAGNOSIS — G894 Chronic pain syndrome: Secondary | ICD-10-CM | POA: Diagnosis not present

## 2017-06-14 DIAGNOSIS — C259 Malignant neoplasm of pancreas, unspecified: Secondary | ICD-10-CM | POA: Diagnosis not present

## 2017-06-14 DIAGNOSIS — G2581 Restless legs syndrome: Secondary | ICD-10-CM | POA: Diagnosis not present

## 2017-06-14 DIAGNOSIS — I2510 Atherosclerotic heart disease of native coronary artery without angina pectoris: Secondary | ICD-10-CM

## 2017-06-14 DIAGNOSIS — R52 Pain, unspecified: Secondary | ICD-10-CM | POA: Diagnosis not present

## 2017-06-14 DIAGNOSIS — D638 Anemia in other chronic diseases classified elsewhere: Secondary | ICD-10-CM | POA: Diagnosis not present

## 2017-06-14 DIAGNOSIS — N390 Urinary tract infection, site not specified: Secondary | ICD-10-CM | POA: Diagnosis not present

## 2017-06-14 DIAGNOSIS — D630 Anemia in neoplastic disease: Secondary | ICD-10-CM | POA: Diagnosis not present

## 2017-06-14 DIAGNOSIS — M545 Low back pain: Secondary | ICD-10-CM | POA: Diagnosis not present

## 2017-06-14 DIAGNOSIS — E1140 Type 2 diabetes mellitus with diabetic neuropathy, unspecified: Secondary | ICD-10-CM | POA: Diagnosis not present

## 2017-06-14 DIAGNOSIS — Z6833 Body mass index (BMI) 33.0-33.9, adult: Secondary | ICD-10-CM | POA: Diagnosis not present

## 2017-06-14 DIAGNOSIS — Z9981 Dependence on supplemental oxygen: Secondary | ICD-10-CM | POA: Diagnosis not present

## 2017-06-14 DIAGNOSIS — N183 Chronic kidney disease, stage 3 unspecified: Secondary | ICD-10-CM

## 2017-06-14 DIAGNOSIS — I5032 Chronic diastolic (congestive) heart failure: Secondary | ICD-10-CM | POA: Diagnosis not present

## 2017-06-14 DIAGNOSIS — IMO0001 Reserved for inherently not codable concepts without codable children: Secondary | ICD-10-CM

## 2017-06-14 DIAGNOSIS — E1142 Type 2 diabetes mellitus with diabetic polyneuropathy: Secondary | ICD-10-CM | POA: Diagnosis not present

## 2017-06-14 DIAGNOSIS — D649 Anemia, unspecified: Secondary | ICD-10-CM

## 2017-06-14 DIAGNOSIS — I110 Hypertensive heart disease with heart failure: Secondary | ICD-10-CM | POA: Diagnosis not present

## 2017-06-14 DIAGNOSIS — M1990 Unspecified osteoarthritis, unspecified site: Secondary | ICD-10-CM

## 2017-06-14 DIAGNOSIS — G4733 Obstructive sleep apnea (adult) (pediatric): Secondary | ICD-10-CM | POA: Diagnosis not present

## 2017-06-14 DIAGNOSIS — Z800 Family history of malignant neoplasm of digestive organs: Secondary | ICD-10-CM

## 2017-06-14 DIAGNOSIS — R509 Fever, unspecified: Secondary | ICD-10-CM | POA: Diagnosis not present

## 2017-06-14 DIAGNOSIS — F329 Major depressive disorder, single episode, unspecified: Secondary | ICD-10-CM | POA: Diagnosis not present

## 2017-06-14 DIAGNOSIS — Z95828 Presence of other vascular implants and grafts: Secondary | ICD-10-CM

## 2017-06-14 LAB — CBC WITH DIFFERENTIAL/PLATELET
BASOS ABS: 0 10*3/uL (ref 0.0–0.1)
Basophils Relative: 1 %
Eosinophils Absolute: 0.1 10*3/uL (ref 0.0–0.5)
Eosinophils Relative: 2 %
HEMATOCRIT: 32.3 % — AB (ref 34.8–46.6)
HEMOGLOBIN: 10.4 g/dL — AB (ref 11.6–15.9)
LYMPHS PCT: 27 %
Lymphs Abs: 1.6 10*3/uL (ref 0.9–3.3)
MCH: 26.6 pg (ref 25.1–34.0)
MCHC: 32.1 g/dL (ref 31.5–36.0)
MCV: 83 fL (ref 79.5–101.0)
Monocytes Absolute: 0.4 10*3/uL (ref 0.1–0.9)
Monocytes Relative: 7 %
NEUTROS ABS: 3.9 10*3/uL (ref 1.5–6.5)
NEUTROS PCT: 63 %
Platelets: 285 10*3/uL (ref 145–400)
RBC: 3.89 MIL/uL (ref 3.70–5.45)
RDW: 17.9 % — ABNORMAL HIGH (ref 11.2–14.5)
WBC: 6.1 10*3/uL (ref 3.9–10.3)

## 2017-06-14 LAB — COMPREHENSIVE METABOLIC PANEL
ALBUMIN: 3.1 g/dL — AB (ref 3.5–5.0)
ALT: 16 U/L (ref 0–55)
ANION GAP: 14 — AB (ref 3–11)
AST: 21 U/L (ref 5–34)
Alkaline Phosphatase: 285 U/L — ABNORMAL HIGH (ref 40–150)
BUN: 41 mg/dL — ABNORMAL HIGH (ref 7–26)
CO2: 25 mmol/L (ref 22–29)
Calcium: 9.9 mg/dL (ref 8.4–10.4)
Chloride: 99 mmol/L (ref 98–109)
Creatinine, Ser: 1.29 mg/dL — ABNORMAL HIGH (ref 0.60–1.10)
GFR, EST AFRICAN AMERICAN: 47 mL/min — AB (ref >60)
GFR, EST NON AFRICAN AMERICAN: 41 mL/min — AB (ref >60)
GLUCOSE: 307 mg/dL — AB (ref 70–140)
POTASSIUM: 3.7 mmol/L (ref 3.5–5.1)
Sodium: 138 mmol/L (ref 136–145)
TOTAL PROTEIN: 7.7 g/dL (ref 6.4–8.3)
Total Bilirubin: 0.3 mg/dL (ref 0.2–1.2)

## 2017-06-14 MED ORDER — SODIUM CHLORIDE 0.9 % IJ SOLN
10.0000 mL | Freq: Once | INTRAMUSCULAR | Status: AC
  Administered 2017-06-14: 10 mL via INTRAVENOUS
  Filled 2017-06-14: qty 10

## 2017-06-14 MED ORDER — SODIUM CHLORIDE 0.9% FLUSH
10.0000 mL | INTRAVENOUS | Status: DC | PRN
  Administered 2017-06-14: 10 mL via INTRAVENOUS
  Filled 2017-06-14: qty 10

## 2017-06-14 MED ORDER — HEPARIN SOD (PORK) LOCK FLUSH 100 UNIT/ML IV SOLN
500.0000 [IU] | Freq: Once | INTRAVENOUS | Status: DC | PRN
  Filled 2017-06-14: qty 5

## 2017-06-14 MED ORDER — HEPARIN SOD (PORK) LOCK FLUSH 100 UNIT/ML IV SOLN
500.0000 [IU] | Freq: Once | INTRAVENOUS | Status: AC
  Administered 2017-06-14: 500 [IU] via INTRAVENOUS
  Filled 2017-06-14: qty 5

## 2017-06-14 MED ORDER — SODIUM CHLORIDE 0.9 % IV SOLN
Freq: Once | INTRAVENOUS | Status: DC
  Administered 2017-06-14: 14:00:00 via INTRAVENOUS

## 2017-06-14 NOTE — Patient Instructions (Signed)

## 2017-06-14 NOTE — Patient Instructions (Addendum)
Dehydration, Adult Dehydration is when there is not enough fluid or water in your body. This happens when you lose more fluids than you take in. Dehydration can range from mild to very bad. It should be treated right away to keep it from getting very bad. Symptoms of mild dehydration may include:  Thirst.  Dry lips.  Slightly dry mouth.  Dry, warm skin.  Dizziness. Symptoms of moderate dehydration may include:  Very dry mouth.  Muscle cramps.  Dark pee (urine). Pee may be the color of tea.  Your body making less pee.  Your eyes making fewer tears.  Heartbeat that is uneven or faster than normal (palpitations).  Headache.  Light-headedness, especially when you stand up from sitting.  Fainting (syncope). Symptoms of very bad dehydration may include:  Changes in skin, such as: ? Cold and clammy skin. ? Blotchy (mottled) or pale skin. ? Skin that does not quickly return to normal after being lightly pinched and let go (poor skin turgor).  Changes in body fluids, such as: ? Feeling very thirsty. ? Your eyes making fewer tears. ? Not sweating when body temperature is high, such as in hot weather. ? Your body making very little pee.  Changes in vital signs, such as: ? Weak pulse. ? Pulse that is more than 100 beats a minute when you are sitting still. ? Fast breathing. ? Low blood pressure.  Other changes, such as: ? Sunken eyes. ? Cold hands and feet. ? Confusion. ? Lack of energy (lethargy). ? Trouble waking up from sleep. ? Short-term weight loss. ? Unconsciousness. Follow these instructions at home:  If told by your doctor, drink an ORS: ? Make an ORS by using instructions on the package. ? Start by drinking small amounts, about  cup (120 mL) every 5-10 minutes. ? Slowly drink more until you have had the amount that your doctor said to have.  Drink enough clear fluid to keep your pee clear or pale yellow. If you were told to drink an ORS, finish the ORS  first, then start slowly drinking clear fluids. Drink fluids such as: ? Water. Do not drink only water by itself. Doing that can make the salt (sodium) level in your body get too low (hyponatremia). ? Ice chips. ? Fruit juice that you have added water to (diluted). ? Low-calorie sports drinks.  Avoid: ? Alcohol. ? Drinks that have a lot of sugar. These include high-calorie sports drinks, fruit juice that does not have water added, and soda. ? Caffeine. ? Foods that are greasy or have a lot of fat or sugar.  Take over-the-counter and prescription medicines only as told by your doctor.  Do not take salt tablets. Doing that can make the salt level in your body get too high (hypernatremia).  Eat foods that have minerals (electrolytes). Examples include bananas, oranges, potatoes, tomatoes, and spinach.  Keep all follow-up visits as told by your doctor. This is important. Contact a doctor if:  You have belly (abdominal) pain that: ? Gets worse. ? Stays in one area (localizes).  You have a rash.  You have a stiff neck.  You get angry or annoyed more easily than normal (irritability).  You are more sleepy than normal.  You have a harder time waking up than normal.  You feel: ? Weak. ? Dizzy. ? Very thirsty.  You have peed (urinated) only a small amount of very dark pee during 6-8 hours. Get help right away if:  You have symptoms of   very bad dehydration.  You cannot drink fluids without throwing up (vomiting).  Your symptoms get worse with treatment.  You have a fever.  You have a very bad headache.  You are throwing up or having watery poop (diarrhea) and it: ? Gets worse. ? Does not go away.  You have blood or something green (bile) in your throw-up.  You have blood in your poop (stool). This may cause poop to look black and tarry.  You have not peed in 6-8 hours.  You pass out (faint).  Your heart rate when you are sitting still is more than 100 beats a  minute.  You have trouble breathing. This information is not intended to replace advice given to you by your health care provider. Make sure you discuss any questions you have with your health care provider. Document Released: 12/04/2008 Document Revised: 08/28/2015 Document Reviewed: 04/03/2015 Elsevier Interactive Patient Education  2018 [employer]  Dehydration, Adult Dehydration is when there is not enough fluid or water in your body. This happens when you lose more fluids than you take in. Dehydration can range from mild to very bad. It should be treated right away to keep it from getting very bad. Symptoms of mild dehydration may include:  Thirst.  Dry lips.  Slightly dry mouth.  Dry, warm skin.  Dizziness. Symptoms of moderate dehydration may include:  Very dry mouth.  Muscle cramps.  Dark pee (urine). Pee may be the color of tea.  Your body making less pee.  Your eyes making fewer tears.  Heartbeat that is uneven or faster than normal (palpitations).  Headache.  Light-headedness, especially when you stand up from sitting.  Fainting (syncope). Symptoms of very bad dehydration may include:  Changes in skin, such as: ? Cold and clammy skin. ? Blotchy (mottled) or pale skin. ? Skin that does not quickly return to normal after being lightly pinched and let go (poor skin turgor).  Changes in body fluids, such as: ? Feeling very thirsty. ? Your eyes making fewer tears. ? Not sweating when body temperature is high, such as in hot weather. ? Your body making very little pee.  Changes in vital signs, such as: ? Weak pulse. ? Pulse that is more than 100 beats a minute when you are sitting still. ? Fast breathing. ? Low blood pressure.  Other changes, such as: ? Sunken eyes. ? Cold hands and feet. ? Confusion. ? Lack of energy (lethargy). ? Trouble waking up from sleep. ? Short-term weight loss. ? Unconsciousness. Follow these instructions at  home:  If told by your doctor, drink an ORS: ? Make an ORS by using instructions on the package. ? Start by drinking small amounts, about  cup (120 mL) every 5-10 minutes. ? Slowly drink more until you have had the amount that your doctor said to have.  Drink enough clear fluid to keep your pee clear or pale yellow. If you were told to drink an ORS, finish the ORS first, then start slowly drinking clear fluids. Drink fluids such as: ? Water. Do not drink only water by itself. Doing that can make the salt (sodium) level in your body get too low (hyponatremia). ? Ice chips. ? Fruit juice that you have added water to (diluted). ? Low-calorie sports drinks.  Avoid: ? Alcohol. ? Drinks that have a lot of sugar. These include high-calorie sports drinks, fruit juice that does not have water added, and soda. ? Caffeine. ? Foods that are greasy or have   a lot of fat or sugar.  Take over-the-counter and prescription medicines only as told by your doctor.  Do not take salt tablets. Doing that can make the salt level in your body get too high (hypernatremia).  Eat foods that have minerals (electrolytes). Examples include bananas, oranges, potatoes, tomatoes, and spinach.  Keep all follow-up visits as told by your doctor. This is important. Contact a doctor if:  You have belly (abdominal) pain that: ? Gets worse. ? Stays in one area (localizes).  You have a rash.  You have a stiff neck.  You get angry or annoyed more easily than normal (irritability).  You are more sleepy than normal.  You have a harder time waking up than normal.  You feel: ? Weak. ? Dizzy. ? Very thirsty.  You have peed (urinated) only a small amount of very dark pee during 6-8 hours. Get help right away if:  You have symptoms of very bad dehydration.  You cannot drink fluids without throwing up (vomiting).  Your symptoms get worse with treatment.  You have a fever.  You have a very bad headache.  You  are throwing up or having watery poop (diarrhea) and it: ? Gets worse. ? Does not go away.  You have blood or something green (bile) in your throw-up.  You have blood in your poop (stool). This may cause poop to look black and tarry.  You have not peed in 6-8 hours.  You pass out (faint).  Your heart rate when you are sitting still is more than 100 beats a minute.  You have trouble breathing. This information is not intended to replace advice given to you by your health care provider. Make sure you discuss any questions you have with your health care provider. Document Released: 12/04/2008 Document Revised: 08/28/2015 Document Reviewed: 04/03/2015 [employer] Interactive Patient Education  2018 [employer]  

## 2017-06-15 LAB — CANCER ANTIGEN 19-9: CAN 19-9: 58 U/mL — AB (ref 0–35)

## 2017-06-16 ENCOUNTER — Encounter: Payer: Self-pay | Admitting: Genetics

## 2017-06-16 ENCOUNTER — Other Ambulatory Visit: Payer: Self-pay

## 2017-06-16 ENCOUNTER — Encounter (HOSPITAL_COMMUNITY): Payer: Self-pay

## 2017-06-16 ENCOUNTER — Other Ambulatory Visit: Payer: Self-pay | Admitting: Medical

## 2017-06-16 ENCOUNTER — Inpatient Hospital Stay (HOSPITAL_COMMUNITY)
Admission: EM | Admit: 2017-06-16 | Discharge: 2017-06-20 | DRG: 551 | Disposition: A | Discharge status: Home Health | Payer: PPO | Attending: Internal Medicine | Admitting: Internal Medicine

## 2017-06-16 ENCOUNTER — Emergency Department (HOSPITAL_COMMUNITY): Payer: PPO

## 2017-06-16 ENCOUNTER — Ambulatory Visit: Payer: Self-pay | Admitting: Genetics

## 2017-06-16 DIAGNOSIS — IMO0001 Reserved for inherently not codable concepts without codable children: Secondary | ICD-10-CM

## 2017-06-16 DIAGNOSIS — R509 Fever, unspecified: Secondary | ICD-10-CM

## 2017-06-16 DIAGNOSIS — G2581 Restless legs syndrome: Secondary | ICD-10-CM | POA: Diagnosis present

## 2017-06-16 DIAGNOSIS — Z885 Allergy status to narcotic agent status: Secondary | ICD-10-CM

## 2017-06-16 DIAGNOSIS — M62830 Muscle spasm of back: Secondary | ICD-10-CM

## 2017-06-16 DIAGNOSIS — Z803 Family history of malignant neoplasm of breast: Secondary | ICD-10-CM

## 2017-06-16 DIAGNOSIS — E1142 Type 2 diabetes mellitus with diabetic polyneuropathy: Secondary | ICD-10-CM | POA: Diagnosis present

## 2017-06-16 DIAGNOSIS — G4733 Obstructive sleep apnea (adult) (pediatric): Secondary | ICD-10-CM | POA: Diagnosis present

## 2017-06-16 DIAGNOSIS — M4807 Spinal stenosis, lumbosacral region: Secondary | ICD-10-CM | POA: Diagnosis present

## 2017-06-16 DIAGNOSIS — R52 Pain, unspecified: Secondary | ICD-10-CM

## 2017-06-16 DIAGNOSIS — Z794 Long term (current) use of insulin: Secondary | ICD-10-CM

## 2017-06-16 DIAGNOSIS — I130 Hypertensive heart and chronic kidney disease with heart failure and stage 1 through stage 4 chronic kidney disease, or unspecified chronic kidney disease: Secondary | ICD-10-CM | POA: Diagnosis present

## 2017-06-16 DIAGNOSIS — Z79899 Other long term (current) drug therapy: Secondary | ICD-10-CM

## 2017-06-16 DIAGNOSIS — Z1379 Encounter for other screening for genetic and chromosomal anomalies: Secondary | ICD-10-CM

## 2017-06-16 DIAGNOSIS — Z800 Family history of malignant neoplasm of digestive organs: Secondary | ICD-10-CM

## 2017-06-16 DIAGNOSIS — C250 Malignant neoplasm of head of pancreas: Secondary | ICD-10-CM | POA: Diagnosis present

## 2017-06-16 DIAGNOSIS — M48061 Spinal stenosis, lumbar region without neurogenic claudication: Principal | ICD-10-CM | POA: Diagnosis present

## 2017-06-16 DIAGNOSIS — G894 Chronic pain syndrome: Secondary | ICD-10-CM | POA: Diagnosis present

## 2017-06-16 DIAGNOSIS — R011 Cardiac murmur, unspecified: Secondary | ICD-10-CM | POA: Diagnosis present

## 2017-06-16 DIAGNOSIS — E785 Hyperlipidemia, unspecified: Secondary | ICD-10-CM | POA: Diagnosis present

## 2017-06-16 DIAGNOSIS — R262 Difficulty in walking, not elsewhere classified: Secondary | ICD-10-CM

## 2017-06-16 DIAGNOSIS — Z6833 Body mass index (BMI) 33.0-33.9, adult: Secondary | ICD-10-CM

## 2017-06-16 DIAGNOSIS — D630 Anemia in neoplastic disease: Secondary | ICD-10-CM | POA: Diagnosis present

## 2017-06-16 DIAGNOSIS — T402X5A Adverse effect of other opioids, initial encounter: Secondary | ICD-10-CM | POA: Diagnosis not present

## 2017-06-16 DIAGNOSIS — T428X5A Adverse effect of antiparkinsonism drugs and other central muscle-tone depressants, initial encounter: Secondary | ICD-10-CM | POA: Diagnosis not present

## 2017-06-16 DIAGNOSIS — G92 Toxic encephalopathy: Secondary | ICD-10-CM | POA: Diagnosis not present

## 2017-06-16 DIAGNOSIS — Z9221 Personal history of antineoplastic chemotherapy: Secondary | ICD-10-CM

## 2017-06-16 DIAGNOSIS — M549 Dorsalgia, unspecified: Secondary | ICD-10-CM | POA: Diagnosis present

## 2017-06-16 DIAGNOSIS — Z923 Personal history of irradiation: Secondary | ICD-10-CM

## 2017-06-16 DIAGNOSIS — Z9689 Presence of other specified functional implants: Secondary | ICD-10-CM | POA: Diagnosis present

## 2017-06-16 DIAGNOSIS — I5032 Chronic diastolic (congestive) heart failure: Secondary | ICD-10-CM | POA: Diagnosis present

## 2017-06-16 DIAGNOSIS — E669 Obesity, unspecified: Secondary | ICD-10-CM | POA: Diagnosis present

## 2017-06-16 DIAGNOSIS — Z7982 Long term (current) use of aspirin: Secondary | ICD-10-CM

## 2017-06-16 DIAGNOSIS — Z8041 Family history of malignant neoplasm of ovary: Secondary | ICD-10-CM

## 2017-06-16 DIAGNOSIS — Z9981 Dependence on supplemental oxygen: Secondary | ICD-10-CM

## 2017-06-16 DIAGNOSIS — I10 Essential (primary) hypertension: Secondary | ICD-10-CM | POA: Diagnosis present

## 2017-06-16 DIAGNOSIS — N189 Chronic kidney disease, unspecified: Secondary | ICD-10-CM | POA: Diagnosis present

## 2017-06-16 DIAGNOSIS — F329 Major depressive disorder, single episode, unspecified: Secondary | ICD-10-CM | POA: Diagnosis present

## 2017-06-16 DIAGNOSIS — F322 Major depressive disorder, single episode, severe without psychotic features: Secondary | ICD-10-CM | POA: Diagnosis present

## 2017-06-16 DIAGNOSIS — N390 Urinary tract infection, site not specified: Secondary | ICD-10-CM | POA: Diagnosis present

## 2017-06-16 DIAGNOSIS — E119 Type 2 diabetes mellitus without complications: Secondary | ICD-10-CM

## 2017-06-16 DIAGNOSIS — E1122 Type 2 diabetes mellitus with diabetic chronic kidney disease: Secondary | ICD-10-CM | POA: Diagnosis present

## 2017-06-16 DIAGNOSIS — E1165 Type 2 diabetes mellitus with hyperglycemia: Secondary | ICD-10-CM | POA: Diagnosis present

## 2017-06-16 DIAGNOSIS — Z882 Allergy status to sulfonamides status: Secondary | ICD-10-CM

## 2017-06-16 DIAGNOSIS — Z888 Allergy status to other drugs, medicaments and biological substances status: Secondary | ICD-10-CM

## 2017-06-16 LAB — BASIC METABOLIC PANEL
ANION GAP: 13 (ref 5–15)
BUN: 35 mg/dL — ABNORMAL HIGH (ref 6–20)
CALCIUM: 8.7 mg/dL — AB (ref 8.9–10.3)
CO2: 27 mmol/L (ref 22–32)
Chloride: 103 mmol/L (ref 101–111)
Creatinine, Ser: 1.05 mg/dL — ABNORMAL HIGH (ref 0.44–1.00)
GFR calc non Af Amer: 52 mL/min — ABNORMAL LOW (ref >60)
Glucose, Bld: 197 mg/dL — ABNORMAL HIGH (ref 65–99)
Potassium: 3.6 mmol/L (ref 3.5–5.1)
SODIUM: 143 mmol/L (ref 135–145)

## 2017-06-16 LAB — CBC WITH DIFFERENTIAL/PLATELET
BASOS ABS: 0 10*3/uL (ref 0.0–0.1)
BASOS PCT: 0 %
Eosinophils Absolute: 0.1 10*3/uL (ref 0.0–0.7)
Eosinophils Relative: 1 %
HEMATOCRIT: 32.5 % — AB (ref 36.0–46.0)
Hemoglobin: 10 g/dL — ABNORMAL LOW (ref 12.0–15.0)
Lymphocytes Relative: 21 %
Lymphs Abs: 2.2 10*3/uL (ref 0.7–4.0)
MCH: 26.4 pg (ref 26.0–34.0)
MCHC: 30.8 g/dL (ref 30.0–36.0)
MCV: 85.8 fL (ref 78.0–100.0)
Monocytes Absolute: 0.7 10*3/uL (ref 0.1–1.0)
Monocytes Relative: 7 %
NEUTROS ABS: 7.3 10*3/uL (ref 1.7–7.7)
NEUTROS PCT: 71 %
Platelets: 268 10*3/uL (ref 150–400)
RBC: 3.79 MIL/uL — ABNORMAL LOW (ref 3.87–5.11)
RDW: 16.6 % — AB (ref 11.5–15.5)
WBC: 10.3 10*3/uL (ref 4.0–10.5)

## 2017-06-16 LAB — CBG MONITORING, ED: Glucose-Capillary: 113 mg/dL — ABNORMAL HIGH (ref 65–99)

## 2017-06-16 MED ORDER — GADOBENATE DIMEGLUMINE 529 MG/ML IV SOLN
20.0000 mL | Freq: Once | INTRAVENOUS | Status: AC | PRN
  Administered 2017-06-16: 20 mL via INTRAVENOUS

## 2017-06-16 MED ORDER — FENTANYL CITRATE (PF) 100 MCG/2ML IJ SOLN
100.0000 ug | INTRAMUSCULAR | Status: DC | PRN
  Administered 2017-06-16: 100 ug via INTRAVENOUS
  Filled 2017-06-16: qty 2

## 2017-06-16 NOTE — ED Notes (Signed)
Patient transported to MRI 

## 2017-06-16 NOTE — ED Notes (Signed)
Attempt to ambulate pt . Pt unable

## 2017-06-16 NOTE — ED Notes (Signed)
ED Provider at bedside. EDP J 

## 2017-06-16 NOTE — ED Notes (Signed)
Bed: [facility]03 Expected date:  Expected time:  Means of arrival:  Comments: 72 yo back pain

## 2017-06-16 NOTE — Progress Notes (Signed)
HPI:  Melissa Myers was previously seen in the [facility] on 05/30/2017 due to a personal and family history of cancer and concerns regarding a hereditary predisposition to cancer. Please refer to our prior cancer genetics clinic note for more information regarding Melissa Myers's medical, social and family histories, and our assessment and recommendations, at the time. Melissa Myers's recent genetic test results were disclosed to her, as well as recommendations warranted by these results. These results and recommendations are discussed in more detail below.  CANCER HISTORY:  Oncology History   Cancer Staging Malignant neoplasm of head of pancreas (HCC) Staging form: Exocrine Pancreas, AJCC 8th Edition - Clinical stage from 04/28/2016: Stage IB (cT2, cN0, cM0) - Signed by Feng, Yan, MD on 02/01/2017       Malignant neoplasm of head of pancreas (HCC)   02/2016 Initial Diagnosis    Malignant neoplasm of head of pancreas (HCC)      03/15/2016 - 03/21/2016 Hospital Admission    Presents with nausea, vomiting, and jaundice   Patient was found hyperbilirubinemic with abnormal abdominal CT suggesting obstructive physiology. Referred for admission and evaluation.      03/15/2016 Imaging    CT Abdomen Pelvis w Contrast 1. Cholecystectomy with borderline biliary duct dilatation. 2. Pancreatic atrophy with moderate pancreatic duct dilatation, followed to the level of the duodenum. 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 Considerations include otherwise occult stricture, periampullary lesion, or main duct intraductal papillary mucinous neoplasm. Given the combination of borderline biliary duct dilatation, moderate pancreatic duct dilatation, and the clinical history of jaundice, consider further evaluation with ERCP. If the patient is not a good ERCP candidate, MRCP with and without contrast would be another option. 3.  No acute process in the abdomen or pelvis. 4.  Tiny hiatal  hernia. 5.  Aortic atherosclerosis.      03/16/2016 Imaging    MRI Abdomen 1. Hypoenhancing lesion in the uncinate process of the pancreas, better demonstrated by MR than on the recent CT scan. The dilated main pancreatic duct and dilated common bile duct abruptly terminate at the cranial margin of this lesion. MR imaging features are concerning for pancreatic adenocarcinoma. ERCP with endoscopic ultrasound would likely prove helpful to further evaluate. 2. No definite metastatic disease in the liver. No peripancreatic lymphadenopathy is identified. Of note, this exam is limited by marked motion degradation and small liver lesions could be obscured. CT scan earlier today demonstrated no peripancreatic lymphadenopathy.      03/18/2016 Procedure    ERCP 1. Malignant-appearing distal bile duct stricturestatus post ERCP with sphincterotomy and plastic biliary stent placement 2. Guidewire finding false lumen with subsequentinjection as described 3. Status post cholecystectomy  Recommendation: 1. Clear liquid diet 2. Unasyn 1.5 g every 6 hours 3. Standard post ERCP observation. Indomethacin suppositories given 4. Trend LFTs 5. Will need endoscopic ultrasound with biopsy with Dr. Jacobs outpatient.      04/28/2016 Pathology Results    Endoscopic US and Biopsy FINE NEEDLE ASPIRATION, ENDOSCOPIC, PANCREAS UNCINATE (SPECIMEN 1 OF 1 COLLECTED 04/28/16): MALIGNANT CELLS CONSISTENT WITH ADENOCARCINOMA.      05/04/2016 -  Hospital Admission    Presents with fever, abdominal pain, malaise, confusion. She was treated for acute cholangitis.       05/05/2016 Imaging    CT Abdomen Pelvis w Contrast Interval placement of a bile duct stent with some decompression of the bile ducts. Mild residual biliary dilatation. Pancreatic ductal dilatation and atrophy. Known pancreatic mass lesion is not well depicted at   CT. No evidence of bowel obstruction or inflammation.      05/05/2016 Imaging    CT  Head w/o Contrast 05/05/16 IMPRESSION: No acute intracranial pathology.      05/06/2016 Procedure    ERCP and Bilary stent placement  Imaging demonstrating removal of endoscopic common bile duct stent. The stent was not able to be replaced.       05/07/2016 Imaging    CT Chest wo Contrast  1. New small right and trace left pleural effusions. Mild cardiomegaly. 2. Borderline enlarged right hilar lymph node at 1 cm in short axis. 3. No definite findings of metastatic disease to the chest. 4. Centrilobular emphysema. 5. High density in the visualized part of the dorsal pancreatic duct, probably contrast medium. 6. Subacute healing fracture of the right seventh rib laterally. 7. Atherosclerosis, including the left anterior descending coronary artery.      05/07/2016 Procedure    On 05/07/16, percutaneous transhepatic cholangiogram and placement of a 10F internal/external biliary drain was performed by IR with aspirated purulent bile sent for culture. Bile culture grew pantoea species, enterococcus faecalis, viridans streptococcus.      07/11/2016 - 07/21/2016 Radiation Therapy    Diagnosis:   cT2N0M0  adenocarcinoma of the pancreas  Indication for treatment:  Curative  Radiation treatment dates:   07/11/16 - 07/21/16  Site/dose:   Pancreas: 33 Gy in 5 fractions  Beams/energy:   SBRT/SRT-VMAT // 10X-FFF Photon  Narrative: The patient tolerated radiation treatment relatively well. The patient complained of diarrhea during treatment. She was instructed to purchase Imodium.      08/2016 - 10/2016 Chemotherapy    She had chemotherapy consistent of gemcitabine and abraxine for 2-3 months under the care of Dr. Blobe at [facility] with her last treatment being about 10/2016.        02/20/2017 Imaging    CT CAP 02/20/17  IMPRESSION: 1. Response to therapy of the primary pancreatic head/uncinate process lesion. Please note that delineation of the tumor is difficult secondary to anatomic  distortion from therapy. 2. No new or progressive disease identified. 3. Removal of biliary stent, with pneumobilia and borderline similar intrahepatic biliary duct dilatation. 4.  No acute process or evidence of metastatic disease in the chest. 5.  Possible constipation. 6. Coronary artery atherosclerosis. Aortic Atherosclerosis (ICD10-I70.0). 7.  Emphysema (ICD10-J43.9). 8. Pulmonary artery enlargement suggests pulmonary arterial hypertension.      02/22/2017 - 04/26/2017 Chemotherapy    Single agent Gemcitabine every 2-3 weeks on and 1 week off, starting 02/22/17. Stopped after cycle 6 on 04/26/17 due to low benefit and intolerance.      05/03/2017 Imaging    IMPRESSION: 1. Subjectively the pancreatic head and uncinate process mass appears similar to the prior exam. The lesion is associated with about 50% circumferential involvement of the SMA and SMV. No compelling findings of distant metastatic spread at this time. 2. Mild biliary dilatation extending down to the pancreatic head mass. Mild pneumobilia. 3. There is mildly increased infiltrative edema along the porta hepatis. 4. Other imaging findings of potential clinical significance: Prominent stool throughout the colon favors constipation. Aortic Atherosclerosis (ICD10-I70.0). Coronary atherosclerosis. Mitral valve calcification. Mild cardiomegaly. Free osteochondral fragments in the right glenohumeral joint. Lower lumbar spondylosis and degenerative disc disease. 2 stable nonspecific hypodense splenic lesions. Emphysema (ICD10-J43.9).      06/09/2017 Genetic Testing    The Common Hereditary Cancers Panel + Nervous System/Brain Cancer Panel was ordered (57 genes). The following genes were evaluated for sequence changes   and exonic deletions/duplications: ALK, APC, ATM, AXIN2, BARD1, BMPR1A, BRCA1, BRCA2, BRIP1, CDH1, CDK4, CDKN2A (p14ARF), CDKN2A (p16INK4a), CHEK2, CTNNA1, DICER1, EPCAM*, GREM1*, HRAS, KIT, MEN1, MLH1, MSH2, MSH3, MSH6, MUTYH,  NBN, NF1, NF2, PALB2, PDGFRA, PHOX2B*, PMS2, POLD1, POLE, PRKAR1A, PTCH1, PTEN, RAD50, RAD51C, RAD51D, RB1, SDHB, SDHC, SDHD, SMAD4, SMARCA4, SMARCB1, SMARCE1, STK11, SUFU, TP53, TSC1, TSC2, VHL. The following genes were evaluated for sequence changes only: HOXB13*, NTHL1*, SDHA  Results: No pathogenic variants identified.  A variant of uncertain significance (VUS) in the gene MLH1 was identified c.299G>A (p.Arg100Gln).  VUS should not be used to make medical  Management decisions.  The date of this test reports  Is 06/09/2017.         FAMILY HISTORY:  We obtained a detailed, 4-generation family history.  Significant diagnoses are listed below: Family History  Problem Relation Age of Onset  . Heart disease Mother   . Heart attack Mother 52  . Heart disease Father   . Heart attack Father 54  . Heart disease Unknown        family history  . Stomach cancer Paternal Grandmother   . Lung cancer Paternal Grandfather   . CVA Unknown        several aunts  . Heart attack Unknown        several aunts and an uncle  . Colon cancer Neg Hx     Melissa Myers has 2 daughters and 1 son.  Her daughters are 47 and 42, and each have 1 child.  Melissa Myers's son is 39 with no history of cancer and no children.  Melissa Myers has a 69 year-old brothers with no history of cancer.  He has a daughter and a son with no history of cancer.   Melissa Myers's father died in his 80's and had cancer, type unk.  Melissa Myers has 4 paternal uncles.   One had a brain tumor, type of tumor unk- does not think it was cancerous.  Melissa Myers's paternal grandfather had lung cancer >50, and her paternal grandmother had stomach cancer dx >50.  Melissa Myers's mother is 92 with no history of cancer.  She had a hysterectomy.  Melissa Myers has 5 maternal aunts and 2 maternal uncles with no history of cancer. (also has 2 other aunts/uncles who died in early childhood).  One of Melissa Myers's maternal uncles had a daughter who  recently was diagnosed with ovarian cancer at 73.  Melissa Myers's maternal grandfather died in his 50's due to an aneurysm, and her maternal grandmother died at 80 with no history of cancer.   Melissa Myers is unaware of previous family history of genetic testing for hereditary cancer risks. Patient's maternal ancestors are of Caucasian descent, and paternal ancestors are of Caucasian descent. There is no reported Ashkenazi Jewish ancestry. There is no known consanguinity.  GENETIC TEST RESULTS: Genetic testing performed through [facility]'s Common Hereditary Cancers Panel + Nervous System/Brain Cancer Panel (57 genes) reported out on 06/09/2017 showed no pathogenic mutations. The following genes were evaluated for sequence changes and exonic deletions/duplications: ALK, APC, ATM, AXIN2, BARD1, BMPR1A, BRCA1, BRCA2, BRIP1, CDH1, CDK4, CDKN2A (p14ARF), CDKN2A (p16INK4a), CHEK2, CTNNA1, DICER1, EPCAM*, GREM1*, HRAS, KIT, MEN1, MLH1, MSH2, MSH3, MSH6, MUTYH, NBN, NF1, NF2, PALB2, PDGFRA, PHOX2B*, PMS2, POLD1, POLE, PRKAR1A, PTCH1, PTEN, RAD50, RAD51C, RAD51D, RB1, SDHB, SDHC, SDHD, SMAD4, SMARCA4, SMARCB1, SMARCE1, STK11, SUFU, TP53, TSC1, TSC2, VHL. The following genes were evaluated for sequence changes only: HOXB13*, NTHL1*, SDHA.  A variant of uncertain significance (  VUS) in a gene called MLH1 was also noted. c.299G>A (p.Arg100Gln)  The test report will be scanned into EPIC and will be located under the Molecular Pathology section of the Results Review tab. A portion of the result report is included below for reference.     We discussed with Melissa Myers that because current genetic testing is not perfect, it is possible there may be a gene mutation in one of these genes that current testing cannot detect, but that chance is small.  We also discussed, that there could be another gene that has not yet been discovered, or that we have not yet tested, that is responsible for the cancer diagnoses in the  family. It is also possible there is a hereditary cause for the cancer in the family that Melissa Myers did not inherit and therefore was not identified in her testing.  Therefore, it is important to remain in touch with cancer genetics in the future so that we can continue to offer Melissa Myers the most up to date genetic testing.   Regarding the VUS in MLH1: At this time, it is unknown if this variant is associated with increased cancer risk or if this is a normal finding, but most variants such as this get reclassified to being inconsequential. It should not be used to make medical management decisions. With time, we suspect the lab will determine the significance of this variant, if any. If we do learn more about it, we will try to contact Melissa Myers to discuss it further. However, it is important to stay in touch with us periodically and keep the address and phone number up to date.  ADDITIONAL GENETIC TESTING: We discussed with Ms. Delbene that there are other genes that are associated with increased cancer risk that can be analyzed. The laboratories that offer this testing look at these additional genes via a hereditary cancer gene panel. Should Ms. Powless wish to pursue additional genetic testing, we are happy to discuss and coordinate this testing, at any time.    CANCER SCREENING RECOMMENDATIONS: Ms. Totton's test result is considered negative (normal).  This means that we have not identified a hereditary cause for her personal and family history of cancer at this time.   While reassuring, this does not definitively rule out a hereditary predisposition to cancer. It is still possible that there could be genetic mutations that are undetectable by current technology, or genetic mutations in genes that have not been tested or identified to increase cancer risk.  Therefore, it is recommended she continue to follow the cancer management and screening guidelines provided by her oncology and  primary healthcare provider. An individual's cancer risk is not determined by genetic test results alone.  Overall cancer risk assessment includes additional factors such as personal medical history, family history, etc.  These should be used to make a personalized plan for cancer prevention and surveillance.    RECOMMENDATIONS FOR FAMILY MEMBERS:  Relatives in this family might be at some increased risk of developing cancer, over the general population risk, simply due to the family history of cancer.  We recommended women in this family have a yearly mammogram beginning at age 40, or 10 years younger than the earliest onset of cancer, an annual clinical breast exam, and perform monthly breast self-exams. Women in this family should also have a gynecological exam as recommended by their primary provider. All family members should have a colonoscopy by age 50 (or as directed by their doctors).  All   family members should inform their physicians about the family history of cancer so their doctors can make the most appropriate screening recommendations for them.   It is also possible there is a hereditary cause for the cancer in Ms. Lile's family that she did not inherit and therefore was not identified in her.   We recommended her maternal and paternal relatives, also have genetic counseling and testing. Ms. Klasen will let us know if we can be of any assistance in coordinating genetic counseling and/or testing for these family members.   FOLLOW-UP: Lastly, we discussed with Ms. Cazarez that cancer genetics is a rapidly advancing field and it is possible that new genetic tests will be appropriate for her and/or her family members in the future. We encouraged her to remain in contact with cancer genetics on an annual basis so we can update her personal and family histories and let her know of advances in cancer genetics that may benefit this family.   Our contact number was provided. Ms. Brower's  questions were answered to her satisfaction, and she knows she is welcome to call us at anytime with additional questions or concerns.    , MS, CGC Certified Genetic Counselor [email or web address] 

## 2017-06-16 NOTE — ED Provider Notes (Signed)
[facility] DEPT Provider Note   CSN: [id number] Arrival date & time: 06/16/17  1602     History   Chief Complaint Chief Complaint  Patient presents with  . Back Pain    HPI Melissa Myers is a 72 y.o. female.  HPI Patient complains of low back pain for several years.  Pain became worse this morning to the point where she was unable to get out of bed due to extreme pain pain radiates to the right buttock.  Worse with movement and improved with remaining still.  She denies any abdominal pain denies fever denies injury.  She was treated by EMS with IV fentanyl with transient relief.  No other associated symptoms no loss of bladder or bowel control no fever. Past Medical History:  Diagnosis Date  . Anemia   . Cancer [facility])    Pancreatic  . Cellulitis    LOWER EXTREMITIES  . Chronic diarrhea    a/w nausea - felt related to IBS  . Deaf    left side only  . Diastolic CHF ([place])   . Disc degeneration, lumbar   . Family history of breast cancer   . Family history of ovarian cancer   . Family history of stomach cancer   . GERD (gastroesophageal reflux disease)   . Hyperlipidemia    hx rhabdo on statins  . Hypertension   . Neuropathy    feet, toes and fingers  . On home oxygen therapy    uses oxygen 2 liters min per [place] at night and prn during day  . OSA (obstructive sleep apnea)    05/2009 sleep study - refuses CPAP  . Osteoarthritis   . RLS (restless legs syndrome)   . Shortness of breath    chronic  . Stasis dermatitis   . Type II or unspecified type diabetes mellitus without mention of complication, not stated as uncontrolled    insulin dep    Patient Active Problem List   Diagnosis Date Noted  . Genetic testing 06/16/2017  . Family history of stomach cancer   . Family history of ovarian cancer   . Family history of breast cancer   . Venous insufficiency 03/10/2017  . Port-A-Cath in place 02/01/2017  . Diarrhea 01/10/2017  .  Left flank pain 01/04/2017  . Goals of care, counseling/discussion   . Malignant neoplasm of head of pancreas ([place])   . Cholangitis 05/05/2016  . Common bile duct stricture   . Hyperbilirubinemia 03/15/2016  . Diabetic peripheral neuropathy ([place]) 02/23/2016  . Murmur, cardiac 07/02/2015  . Chronic pain syndrome 05/05/2015  . MDD (major depressive disorder), single episode, severe ([place]) 05/05/2015  . Chronic anemia 04/14/2015  . Cellulitis 04/14/2015  . IDDM (insulin dependent diabetes mellitus) ([place]) 03/19/2015  . Hyperlipidemia 12/23/2014  . Chronic diastolic heart failure ([place]) 06/01/2009  . Vitamin D deficiency 05/12/2009  . Sleep apnea 04/29/2009  . Dyslipidemia 04/20/2009  . Morbid obesity ([place]) 04/20/2009  . RESTLESS LEGS SYNDROME 04/20/2009  . Essential hypertension 04/20/2009    Past Surgical History:  Procedure Laterality Date  . BILIARY STENT PLACEMENT N/A 05/06/2016   Procedure: BILIARY STENT PLACEMENT;  Surgeon: Gatha Mayer, MD;  Location: [facility];  Service: Endoscopy;  Laterality: N/A;  . CHOLECYSTECTOMY  1997  . COLONOSCOPY N/A 12/03/2012   Procedure: COLONOSCOPY;  Surgeon: Lafayette Dragon, MD;  Location: [facility];  Service: Endoscopy;  Laterality: N/A;  . ENDOSCOPIC RETROGRADE CHOLANGIOPANCREATOGRAPHY (ERCP) WITH PROPOFOL N/A 05/06/2016  Procedure: ENDOSCOPIC RETROGRADE CHOLANGIOPANCREATOGRAPHY (ERCP) WITH PROPOFOL;  Surgeon: Gatha Mayer, MD;  Location: [facility];  Service: Endoscopy;  Laterality: N/A;  . ERCP N/A 03/18/2016   Procedure: ENDOSCOPIC RETROGRADE CHOLANGIOPANCREATOGRAPHY (ERCP);  Surgeon: Irene Shipper, MD;  Location: [facility];  Service: Endoscopy;  Laterality: N/A;  . EUS N/A 04/28/2016   Procedure: UPPER ENDOSCOPIC ULTRASOUND (EUS) LINEAR;  Surgeon: [name], MD;  Location: [facility];  Service: Endoscopy;  Laterality: N/A;  . IR CHOLANGIOGRAM EXISTING TUBE  06/09/2016  . IR GENERIC HISTORICAL  05/07/2016   IR BILIARY DRAIN  PLACEMENT WITH CHOLANGIOGRAM 05/07/2016 Jacqulynn Cadet, MD [facility]-INTERV RAD  . IR GENERIC HISTORICAL  05/19/2016   IR BILIARY STENT(S) EXISTING ACCESS INC DILATION CATH EXCHANGE 05/19/2016 Jacqulynn Cadet, MD [facility]-INTERV RAD  . TONSILLECTOMY  1970  . TUBAL LIGATION  1980  . UMBILICAL HERNIA REPAIR  1995  . uterine polyp removal  2008     OB History   None      Home Medications    Prior to Admission medications   Medication Sig Start Date End Date Taking? Authorizing Provider  aspirin EC 81 MG tablet Take 1 tablet (81 mg total) by mouth daily. 07/08/14   Rowe Clack, MD  baclofen (LIORESAL) 10 MG tablet Take 0.5 tablets (5 mg total) by mouth 3 (three) times daily. 05/02/17   Tanner, Lyndon Code., PA-C  carvedilol (COREG) 25 MG tablet TAKE ONE TABLET TWICE DAILY WITH FOOD 01/25/17   Larey Dresser, MD  Cholecalciferol (VITAMIN D3) 2000 units capsule Take 1 capsule (2,000 Units total) by mouth daily. 06/17/15   Janith Lima, MD  Continuous Blood Gluc Sensor ([place]) MISC Use to check sugars 12/30/16   [name], MD  diphenoxylate-atropine (LOMOTIL) 2.5-0.025 MG tablet Take 1-2 tablets by mouth 4 (four) times daily as needed for diarrhea or loose stools. 02/01/17   Truitt Merle, MD  gabapentin (NEURONTIN) 300 MG capsule TAKE TWO CAPSULES FOUR TIMES DAILY 05/15/17   [name], MD  hydrALAZINE (APRESOLINE) 25 MG tablet TAKE ONE TABLET THREE TIMES DAILY 04/20/17   Bensimhon, Shaune Pascal, MD  insulin aspart (NOVOLOG) 100 UNIT/ML injection Inject 0-20 Units into the skin 3 (three) times daily with meals. . CBG 70 - 120: 0 units CBG 121 - 150: 3 units CBG 151 - 200: 4 units CBG 201 - 250: 7 units CBG 251 - 300: 11 units CBG 301 - 350: 15 units CBG 351 - 400: 20 units 03/21/16   Mariel Aloe, MD  insulin glargine (LANTUS) 100 UNIT/ML injection Inject 0.3 mLs (30 Units total) into the skin daily. 03/22/16   Mariel Aloe, MD  lactulose (CHRONULAC) 10  GM/15ML solution Take 10 g by mouth daily as needed for mild constipation.    [provider]  lidocaine (LIDODERM) 5 % Place 1 patch daily onto the skin. Remove & Discard patch within 12 hours or as directed by MD 12/30/16   [name], MD  lidocaine-prilocaine (EMLA) cream Apply to port site prior to being accessed 09/20/16   [provider]  lipase/protease/amylase (CREON) 36000 UNITS CPEP capsule 2 po prior to snacks; 4 po prior to meals 09/20/16   [provider]  LYRICA 75 MG capsule TAKE ONE CAPSULE EACH DAY 04/24/17   [name], MD  meclizine (ANTIVERT) 25 MG tablet Take 25 mg 2 (two) times daily as needed by mouth for dizziness.     [provider]  metFORMIN (GLUCOPHAGE) 500 MG tablet Take 500 mg by mouth daily with breakfast.    [provider]  mirtazapine (REMERON) 30 MG tablet Take 1 tablet (30 mg total) by mouth at bedtime. 01/10/17   Truitt Merle, MD  mupirocin ointment (BACTROBAN) 2 % Apply 1 application topically daily. 03/10/17   [name], MD  ondansetron (ZOFRAN) 8 MG tablet Take 1 tablet (8 mg total) by mouth every 8 (eight) hours as needed for nausea or vomiting. 01/10/17   Truitt Merle, MD  [facility] VERIO test strip USE AS INSTRUCTED UP TO 4 TIMES DAILY 11/03/16   [name], MD  oxyCODONE (OXY IR/ROXICODONE) 5 MG immediate release tablet Take 1 tablet (5 mg total) by mouth 2 (two) times daily as needed for severe pain. 03/10/17   [name], MD  potassium chloride SA (K-DUR,KLOR-CON) 20 MEQ tablet Take 20 mEq by mouth daily.    [provider]  rOPINIRole (REQUIP XL) 4 MG 24 hr tablet TAKE ONE TABLET AT BEDTIME 10/20/16   [name], MD  rOPINIRole (REQUIP) 2 MG tablet Take 1 tablet (2 mg total) by mouth 3 (three) times daily. 02/02/17   [name], MD  sertraline (ZOLOFT) 50 MG tablet Take 50 mg by mouth daily.    [provider]  silver sulfADIAZINE  (SILVADENE) 1 % cream Apply 1 application daily topically. 01/03/17   [name], MD  spironolactone (ALDACTONE) 25 MG tablet Take 1 tablet (25 mg total) by mouth daily. 06/17/16 11/24/17  Larey Dresser, MD  torsemide (DEMADEX) 100 MG tablet Take 0.5 tablets (50 mg total) by mouth daily. Take 100 mg (1 whole tablet) on chemo days and as needed for swelling/weight gain. 04/12/17   Deyna Friar, PA-C  traMADol (ULTRAM) 50 MG tablet Take 1 tablet (50 mg total) by mouth every 6 (six) hours as needed. Patient taking differently: Take 50 mg every 6 (six) hours as needed by mouth for moderate pain.  11/03/16   [name], MD    Family History Family History  Problem Relation Age of Onset  . Heart disease Mother   . Heart attack Mother 44  . Heart disease Father   . Heart attack Father 51  . Heart disease Unknown        family history  . Stomach cancer Paternal Grandmother   . Lung cancer Paternal Grandfather   . CVA Unknown        several aunts  . Heart attack Unknown        several aunts and an uncle  . Colon cancer Neg Hx     Social History Social History   Tobacco Use  . Smoking status: Never Smoker  . Smokeless tobacco: Never Used  Substance Use Topics  . Alcohol use: No  . Drug use: No     Allergies   Morphine; Statins; Sulfa antibiotics; Cymbalta [duloxetine hcl]; Sulfasalazine; Levemir [insulin detemir]; and Zinc   Review of Systems Review of Systems  Constitutional: Negative.   HENT: Negative.   Respiratory: Negative.   Cardiovascular: Negative.   Gastrointestinal: Negative.   Musculoskeletal: Positive for back pain and gait problem.       Walks with walker chronically  Skin: Negative.   Allergic/Immunologic: Positive for immunocompromised state.       Cancer patient, diabetic  Psychiatric/Behavioral: Negative.   All other systems reviewed and are negative.    Physical Exam Updated Vital Signs BP [name])  127/42 (BP Location:  Right Arm)   Pulse 60   Temp 97.6 F (36.4 C) (Oral)   Resp 16   Ht 5\' 5"  (1.651 m)   Wt 92.1 kg (203 lb)   SpO2 100%   BMI 33.78 kg/m   Physical Exam  Constitutional: She is oriented to person, place, and time.  Chronically ill-appearing  HENT:  Head: Normocephalic and atraumatic.  Eyes: Pupils are equal, round, and reactive to light. Conjunctivae are normal.  Neck: Neck supple. No tracheal deviation present. No thyromegaly present.  Cardiovascular: Normal rate and regular rhythm.  No murmur heard. Pulmonary/Chest: Effort normal and breath sounds normal.  Abdominal: Soft. Bowel sounds are normal. She exhibits no distension. There is no tenderness.  Obese  Musculoskeletal: Normal range of motion. She exhibits no edema or tenderness.  Entire spine nontender.  She has extreme pain at lower back when moving in bed.  Neurological: She is alert and oriented to person, place, and time. No cranial nerve deficit. Coordination normal.  DTR symmetric bilaterally at knee jerk and ankle jerks  toes downgoing bilaterally  Skin: Skin is warm and dry. Capillary refill takes less than 2 seconds. No rash noted.  Psychiatric: She has a normal mood and affect.  Nursing note and vitals reviewed.    ED Treatments / Results  Labs (all labs ordered are listed, but only abnormal results are displayed) Labs Reviewed  BASIC METABOLIC PANEL  CBC WITH DIFFERENTIAL/PLATELET    EKG None  Radiology No results found.  Procedures Procedures (including critical care time)  Medications Ordered in ED Medications - No data to display Results for orders placed or performed during the hospital encounter of 32/44/01  Basic metabolic panel  Result Value Ref Range   Sodium 143 135 - 145 mmol/L   Potassium 3.6 3.5 - 5.1 mmol/L   Chloride 103 101 - 111 mmol/L   CO2 27 22 - 32 mmol/L   Glucose, Bld 197 (H) 65 - 99 mg/dL   BUN 35 (H) 6 - 20 mg/dL   Creatinine, Ser 1.05 (H) 0.44 - 1.00 mg/dL   Calcium  8.7 (L) 8.9 - 10.3 mg/dL   GFR calc non Af Amer 52 (L) >60 mL/min   GFR calc Af Amer >60 >60 mL/min   Anion gap 13 5 - 15  CBC with Differential/Platelet  Result Value Ref Range   WBC 10.3 4.0 - 10.5 K/uL   RBC 3.79 (L) 3.87 - 5.11 MIL/uL   Hemoglobin 10.0 (L) 12.0 - 15.0 g/dL   HCT 32.5 (L) 36.0 - 46.0 %   MCV 85.8 78.0 - 100.0 fL   MCH 26.4 26.0 - 34.0 pg   MCHC 30.8 30.0 - 36.0 g/dL   RDW 16.6 (H) 11.5 - 15.5 %   Platelets 268 150 - 400 K/uL   Neutrophils Relative % 71 %   Neutro Abs 7.3 1.7 - 7.7 K/uL   Lymphocytes Relative 21 %   Lymphs Abs 2.2 0.7 - 4.0 K/uL   Monocytes Relative 7 %   Monocytes Absolute 0.7 0.1 - 1.0 K/uL   Eosinophils Relative 1 %   Eosinophils Absolute 0.1 0.0 - 0.7 K/uL   Basophils Relative 0 %   Basophils Absolute 0.0 0.0 - 0.1 K/uL   *Note: Due to a large number of results and/or encounters for the requested time period, some results have not been displayed. A complete set of results can be found in Results Review.   Mr Lumbar Spine W Wo  Contrast  Result Date: 06/16/2017 CLINICAL DATA:  72 y/o F; lower back pain radiating to the left buttocks. History of diabetes and pancreatic cancer. EXAM: MRI LUMBAR SPINE WITHOUT AND WITH CONTRAST TECHNIQUE: Multiplanar and multiecho pulse sequences of the lumbar spine were obtained without and with intravenous contrast. CONTRAST:  24mL MULTIHANCE GADOBENATE DIMEGLUMINE 529 MG/ML IV SOLN COMPARISON:  05/03/2017 CT abdomen and pelvis. 06/24/2010 MRI of the lumbar spine. FINDINGS: Segmentation:  Standard. Alignment:  Physiologic. Vertebrae: Mild edema and enhancement within the opposing L3 and L4 endplates. Small bilateral L4-5 facet effusions. Mild edema and enhancement of the L4 and L5 spinous processes with pseudoarticulation. No loss of vertebral body height Conus medullaris and cauda equina: Conus extends to the L1 level. Conus and cauda equina appear normal. Paraspinal and other soft tissues: Subcentimeter cysts within  the kidneys bilaterally. Disc levels: L1-2: No significant disc displacement, foraminal stenosis, or canal stenosis. L2-3: No significant disc displacement, foraminal stenosis, or canal stenosis. L3-4: Small disc bulge eccentric to the right and mild facet hypertrophy. Mild right foraminal stenosis. No canal stenosis. L4-5: Moderate disc bulge with facet and ligamentum flavum hypertrophy. Mild bilateral foraminal stenosis. Severe canal stenosis. L5-S1: Small disc bulge eccentric to the left with left-sided foraminal and extraforaminal endplate marginal osteophytes as well as left-greater-than-right facet hypertrophy. Mild right and moderate to severe left foraminal stenosis. Mild canal stenosis. IMPRESSION: 1. Mild opposing L3-4 endplate edema, likely degenerative. Small L4-5 facet effusions, also likely degenerative. 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 2. Lumbar spondylosis greatest at the L4-5 and L5-S1 levels. 3. Severe multifactorial L4-5 canal stenosis. 4. Moderate to severe left L5-S1 foraminal stenosis. Multilevel mild foraminal stenosis. Electronically Signed   By: Kristine Garbe M.D.   On: 06/16/2017 22:19    Initial Impression / Assessment and Plan / ED Course  I have reviewed the triage vital signs and the nursing notes.  Pertinent labs & imaging results that were available during my care of the patient were reviewed by me and considered in my medical decision making (see chart for details).   MRI lumbar spine ordered as patient has lumbar radiculopathy and is immunocompromise  10:30 PM upon attempting to sit patient up in bed from a supine position she has exquisite low back pain is unable to sit up due to severe low back pain. I have consulted hospitalist service will arrange for overnight stay  Lab work consistent with anemia which is chronic and hyperglycemia.  Also  mild chronic renal insufficiency. Final Clinical Impressions(s) / ED Diagnoses  Diagnosis#1 intractable low  back pain Final diagnoses:  None  #2 hyperglycemia #3 anemia #4 chronic renal insufficiency  ED Discharge Orders    None       Orlie Dakin, MD 06/17/17 [id number]

## 2017-06-16 NOTE — ED Triage Notes (Addendum)
Per [facility] Pt resides from home. Sudden onset of lower back pain radiates to left/right buttocks. Denies injury. No loss of bowel or bladder. Denies N/V/D or fever Fentanyl 50 mch IVP given in route. [place] applied after administration for support. Pt alert and active with care upon arrival. Prior to EMS arrival pt took motrin 800mg , "muscle relaxant" and "oyx 10mg "

## 2017-06-16 NOTE — ED Notes (Addendum)
Spoke MRI  20 mins out  giving 125mcg pain med --  ----- MRI instructed to leave pt on 2 liters during procedure

## 2017-06-16 NOTE — ED Notes (Signed)
IN MRI

## 2017-06-17 ENCOUNTER — Observation Stay (HOSPITAL_BASED_OUTPATIENT_CLINIC_OR_DEPARTMENT_OTHER): Payer: PPO

## 2017-06-17 ENCOUNTER — Observation Stay (HOSPITAL_COMMUNITY): Payer: PPO

## 2017-06-17 DIAGNOSIS — M549 Dorsalgia, unspecified: Secondary | ICD-10-CM

## 2017-06-17 DIAGNOSIS — M79662 Pain in left lower leg: Secondary | ICD-10-CM | POA: Diagnosis not present

## 2017-06-17 DIAGNOSIS — R011 Cardiac murmur, unspecified: Secondary | ICD-10-CM | POA: Diagnosis not present

## 2017-06-17 DIAGNOSIS — R52 Pain, unspecified: Secondary | ICD-10-CM

## 2017-06-17 DIAGNOSIS — M19072 Primary osteoarthritis, left ankle and foot: Secondary | ICD-10-CM | POA: Diagnosis not present

## 2017-06-17 LAB — GLUCOSE, CAPILLARY
GLUCOSE-CAPILLARY: 188 mg/dL — AB (ref 65–99)
Glucose-Capillary: 271 mg/dL — ABNORMAL HIGH (ref 65–99)
Glucose-Capillary: 233 mg/dL — ABNORMAL HIGH (ref 65–99)

## 2017-06-17 LAB — ECHOCARDIOGRAM COMPLETE
HEIGHTINCHES: 60 in
Weight: 3132.3 oz

## 2017-06-17 MED ORDER — PANCRELIPASE (LIP-PROT-AMYL) 12000-38000 UNITS PO CPEP
36000.0000 [IU] | ORAL_CAPSULE | Freq: Three times a day (TID) | ORAL | Status: DC
  Administered 2017-06-17 – 2017-06-20 (×10): 36000 [IU] via ORAL
  Filled 2017-06-17 (×11): qty 3

## 2017-06-17 MED ORDER — BACLOFEN 10 MG PO TABS
5.0000 mg | ORAL_TABLET | Freq: Three times a day (TID) | ORAL | Status: DC
  Administered 2017-06-17: 5 mg via ORAL
  Filled 2017-06-17: qty 1

## 2017-06-17 MED ORDER — ENOXAPARIN SODIUM 40 MG/0.4ML ~~LOC~~ SOLN
40.0000 mg | SUBCUTANEOUS | Status: DC
  Administered 2017-06-17 – 2017-06-20 (×4): 40 mg via SUBCUTANEOUS
  Filled 2017-06-17 (×4): qty 0.4

## 2017-06-17 MED ORDER — ONDANSETRON HCL 4 MG PO TABS
4.0000 mg | ORAL_TABLET | Freq: Four times a day (QID) | ORAL | Status: DC | PRN
Start: 2017-06-17 — End: 2017-06-20

## 2017-06-17 MED ORDER — ROPINIROLE HCL 1 MG PO TABS
2.0000 mg | ORAL_TABLET | Freq: Three times a day (TID) | ORAL | Status: DC
  Filled 2017-06-17: qty 2

## 2017-06-17 MED ORDER — POLYETHYLENE GLYCOL 3350 17 G PO PACK: 17.0000 g | PACK | Freq: Every day | ORAL | Status: DC | PRN

## 2017-06-17 MED ORDER — BACLOFEN 10 MG PO TABS: 5.0000 mg | ORAL_TABLET | Freq: Three times a day (TID) | ORAL | Status: DC | PRN

## 2017-06-17 MED ORDER — MIRTAZAPINE 30 MG PO TABS: 30.0000 mg | ORAL_TABLET | Freq: Every day | ORAL | Status: DC

## 2017-06-17 MED ORDER — INSULIN ASPART 100 UNIT/ML ~~LOC~~ SOLN
0.0000 [IU] | Freq: Three times a day (TID) | SUBCUTANEOUS | Status: DC
  Administered 2017-06-17: 2 [IU] via SUBCUTANEOUS
  Administered 2017-06-17: 5 [IU] via SUBCUTANEOUS
  Administered 2017-06-17: 3 [IU] via SUBCUTANEOUS
  Administered 2017-06-18: 5 [IU] via SUBCUTANEOUS
  Administered 2017-06-18: 2 [IU] via SUBCUTANEOUS

## 2017-06-17 MED ORDER — MECLIZINE HCL 25 MG PO TABS: 25.0000 mg | ORAL_TABLET | Freq: Two times a day (BID) | ORAL | Status: DC | PRN

## 2017-06-17 MED ORDER — OXYCODONE HCL 5 MG PO TABS
5.0000 mg | ORAL_TABLET | Freq: Two times a day (BID) | ORAL | Status: DC | PRN
  Administered 2017-06-17: 5 mg via ORAL
  Filled 2017-06-17: qty 1

## 2017-06-17 MED ORDER — LIDOCAINE 5 % EX PTCH
1.0000 | MEDICATED_PATCH | CUTANEOUS | Status: DC
  Administered 2017-06-17 – 2017-06-20 (×4): 1 via TRANSDERMAL
  Filled 2017-06-17 (×4): qty 1

## 2017-06-17 MED ORDER — CARVEDILOL 25 MG PO TABS
25.0000 mg | ORAL_TABLET | Freq: Two times a day (BID) | ORAL | Status: DC
  Administered 2017-06-17 – 2017-06-20 (×7): 25 mg via ORAL
  Filled 2017-06-17 (×8): qty 1

## 2017-06-17 MED ORDER — HYDRALAZINE HCL 25 MG PO TABS
25.0000 mg | ORAL_TABLET | Freq: Three times a day (TID) | ORAL | Status: DC
  Administered 2017-06-17: 25 mg via ORAL
  Filled 2017-06-17 (×2): qty 1

## 2017-06-17 MED ORDER — MORPHINE SULFATE (PF) 2 MG/ML IV SOLN
2.0000 mg | INTRAVENOUS | Status: DC | PRN
  Administered 2017-06-18: 2 mg via INTRAVENOUS
  Filled 2017-06-17: qty 1

## 2017-06-17 MED ORDER — POTASSIUM CHLORIDE CRYS ER 20 MEQ PO TBCR
20.0000 meq | EXTENDED_RELEASE_TABLET | Freq: Every day | ORAL | Status: DC
  Administered 2017-06-17 – 2017-06-20 (×4): 20 meq via ORAL
  Filled 2017-06-17 (×4): qty 1

## 2017-06-17 MED ORDER — MORPHINE SULFATE (PF) 2 MG/ML IV SOLN
2.0000 mg | INTRAVENOUS | Status: DC | PRN
  Administered 2017-06-17 (×2): 2 mg via INTRAVENOUS
  Filled 2017-06-17 (×2): qty 1

## 2017-06-17 MED ORDER — GABAPENTIN 300 MG PO CAPS
300.0000 mg | ORAL_CAPSULE | Freq: Four times a day (QID) | ORAL | Status: DC
  Filled 2017-06-17: qty 1

## 2017-06-17 MED ORDER — PREGABALIN 75 MG PO CAPS
75.0000 mg | ORAL_CAPSULE | Freq: Every day | ORAL | Status: DC
  Administered 2017-06-17 – 2017-06-20 (×4): 75 mg via ORAL
  Filled 2017-06-17 (×4): qty 1

## 2017-06-17 MED ORDER — ROPINIROLE HCL 1 MG PO TABS
2.0000 mg | ORAL_TABLET | Freq: Two times a day (BID) | ORAL | Status: DC
  Administered 2017-06-17: 2 mg via ORAL
  Filled 2017-06-17 (×2): qty 2

## 2017-06-17 MED ORDER — ASPIRIN EC 81 MG PO TBEC
81.0000 mg | DELAYED_RELEASE_TABLET | Freq: Every day | ORAL | Status: DC
  Administered 2017-06-17 – 2017-06-20 (×4): 81 mg via ORAL
  Filled 2017-06-17 (×4): qty 1

## 2017-06-17 MED ORDER — SERTRALINE HCL 50 MG PO TABS
50.0000 mg | ORAL_TABLET | Freq: Every day | ORAL | Status: DC
  Administered 2017-06-17: 50 mg via ORAL
  Filled 2017-06-17: qty 1

## 2017-06-17 MED ORDER — ROPINIROLE HCL 1 MG PO TABS
2.0000 mg | ORAL_TABLET | Freq: Two times a day (BID) | ORAL | Status: DC
  Administered 2017-06-17 – 2017-06-20 (×6): 2 mg via ORAL
  Filled 2017-06-17 (×6): qty 2

## 2017-06-17 MED ORDER — ROPINIROLE HCL ER 4 MG PO TB24: 4.0000 mg | ORAL_TABLET | Freq: Every day | ORAL | Status: DC

## 2017-06-17 MED ORDER — HYDROMORPHONE HCL 1 MG/ML IJ SOLN
0.5000 mg | INTRAMUSCULAR | Status: DC | PRN
  Administered 2017-06-17: 0.5 mg via INTRAVENOUS
  Filled 2017-06-17: qty 1

## 2017-06-17 MED ORDER — INSULIN GLARGINE 100 UNIT/ML ~~LOC~~ SOLN
20.0000 [IU] | Freq: Every day | SUBCUTANEOUS | Status: DC
  Administered 2017-06-17 – 2017-06-20 (×4): 20 [IU] via SUBCUTANEOUS
  Filled 2017-06-17 (×4): qty 0.2

## 2017-06-17 MED ORDER — ONDANSETRON HCL 4 MG/2ML IJ SOLN
4.0000 mg | Freq: Four times a day (QID) | INTRAMUSCULAR | Status: DC | PRN
  Administered 2017-06-19: 4 mg via INTRAVENOUS
  Filled 2017-06-17: qty 2

## 2017-06-17 NOTE — H&P (Signed)
History and Physical    Melissa Myers DOB: Aug 02, 1945 DOA: 06/16/2017  PCP: [name], MD   Patient coming from: Home  Chief Complaint: Back pain  HPI: Melissa Myers is a 72 y.o. female with medical history significant pancreatic cancer, HTN, diastolic CHF, chronic pain restless leg syndrome presented to the ED with complaints of worsening of her chronic back pain that started today.  At baseline patient takes oxycodone 1 to 2 tablets in a week for chronic back pain.  Patient denies trauma or falls.  She denies fever or chills.  Denies weakness of any of her extremities.,  Denies urinary or bowel problems.  Denies numbness of her extremities or numbness to suggest saddle anesthesia.  She denies radiation of pain down her lower extremities. Patient's states pain is so bad that she cannot walk, or even sit up in bed.  ED Course: Pulse 50s to 60s, otherwise unremarkable vitals.  WBC- 10.3, stable hemoglobin - ~10, unremarkable BMP.  MRI lumbar spine With and without contrast-severe multifactorial L4-L5 canal stenosis, moderate to severe left L5-S1 foraminal stenosis.  Patient was given fentanyl for pain in the ED. Patient was unable to ambulate in the ED, hospitalist was called to admit for intractable back pain.  Review of Systems: As per HPI otherwise 10 point review of systems negative.   Past Medical History:  Diagnosis Date  . Anemia   . Cancer [facility])    Pancreatic  . Cellulitis    LOWER EXTREMITIES  . Chronic diarrhea    a/w nausea - felt related to IBS  . Deaf    left side only  . Diastolic CHF ([place])   . Disc degeneration, lumbar   . Family history of breast cancer   . Family history of ovarian cancer   . Family history of stomach cancer   . GERD (gastroesophageal reflux disease)   . Hyperlipidemia    hx rhabdo on statins  . Hypertension   . Neuropathy    feet, toes and fingers  . On home oxygen therapy    uses oxygen 2 liters min per  [place] at night and prn during day  . OSA (obstructive sleep apnea)    05/2009 sleep study - refuses CPAP  . Osteoarthritis   . RLS (restless legs syndrome)   . Shortness of breath    chronic  . Stasis dermatitis   . Type II or unspecified type diabetes mellitus without mention of complication, not stated as uncontrolled    insulin dep    Past Surgical History:  Procedure Laterality Date  . BILIARY STENT PLACEMENT N/A 05/06/2016   Procedure: BILIARY STENT PLACEMENT;  Surgeon: Gatha Mayer, MD;  Location: [place];  Service: Endoscopy;  Laterality: N/A;  . CHOLECYSTECTOMY  1997  . COLONOSCOPY N/A 12/03/2012   Procedure: COLONOSCOPY;  Surgeon: Lafayette Dragon, MD;  Location: [facility];  Service: Endoscopy;  Laterality: N/A;  . ENDOSCOPIC RETROGRADE CHOLANGIOPANCREATOGRAPHY (ERCP) WITH PROPOFOL N/A 05/06/2016   Procedure: ENDOSCOPIC RETROGRADE CHOLANGIOPANCREATOGRAPHY (ERCP) WITH PROPOFOL;  Surgeon: Gatha Mayer, MD;  Location: [place];  Service: Endoscopy;  Laterality: N/A;  . ERCP N/A 03/18/2016   Procedure: ENDOSCOPIC RETROGRADE CHOLANGIOPANCREATOGRAPHY (ERCP);  Surgeon: Irene Shipper, MD;  Location: [facility];  Service: Endoscopy;  Laterality: N/A;  . EUS N/A 04/28/2016   Procedure: UPPER ENDOSCOPIC ULTRASOUND (EUS) LINEAR;  Surgeon: [name], MD;  Location: [facility];  Service: Endoscopy;  Laterality: N/A;  . IR CHOLANGIOGRAM EXISTING  TUBE  06/09/2016  . IR GENERIC HISTORICAL  05/07/2016   IR BILIARY DRAIN PLACEMENT WITH CHOLANGIOGRAM 05/07/2016 Jacqulynn Cadet, MD [facility]-INTER[facility] RAD  . IR GENERIC HISTORICAL  05/19/2016   IR BILIARY STENT(S) EXISTING ACCESS INC DILATION CATH EXCHANGE 05/19/2016 Jacqulynn Cadet, MD [facility]-INTERV RAD  . TONSILLECTOMY  1970  . TUBAL LIGATION  1980  . UMBILICAL HERNIA REPAIR  1995  . uterine polyp removal  2008     reports that she has never smoked. She has never used smokeless tobacco. She reports that she does not drink alcohol or use  drugs.  Allergies  Allergen Reactions  . Morphine Other (See Comments) and Nausea Only    GI upset and headaches GI upset and headaches  . Statins Other (See Comments)    Statin drugs cause muscle pain / "muscle damage"--was told by MD not to take rhabdomyolysis Statin drugs cause muscle pain / "muscle damage"--was told by MD not to take  . Sulfa Antibiotics Diarrhea and Nausea Only  . Cymbalta [Duloxetine Hcl] Other (See Comments)    Restless leg syndrome Restless leg syndrome, rash, itching, and diarrhea  . Sulfasalazine Diarrhea  . Levemir [Insulin Detemir] Itching  . Zinc Swelling, Rash, Hives and Other (See Comments)    Family History  Problem Relation Age of Onset  . Heart disease Mother   . Heart attack Mother 69  . Heart disease Father   . Heart attack Father 76  . Heart disease Unknown        family history  . Stomach cancer Paternal Grandmother   . Lung cancer Paternal Grandfather   . CVA Unknown        several aunts  . Heart attack Unknown        several aunts and an uncle  . Colon cancer Neg Hx     Prior to Admission medications   Medication Sig Start Date End Date Taking? Authorizing Provider  baclofen (LIORESAL) 10 MG tablet Take 0.5 tablets (5 mg total) by mouth 3 (three) times daily. 05/02/17  Yes Tanner, Lyndon Code., PA-C  carvedilol (COREG) 25 MG tablet TAKE ONE TABLET TWICE DAILY WITH FOOD 01/25/17  Yes Larey Dresser, MD  Cholecalciferol (VITAMIN D3) 2000 units capsule Take 1 capsule (2,000 Units total) by mouth daily. 06/17/15  Yes Janith Lima, MD  diphenoxylate-atropine (LOMOTIL) 2.5-0.025 MG tablet Take 1-2 tablets by mouth 4 (four) times daily as needed for diarrhea or loose stools. 02/01/17  Yes Truitt Merle, MD  gabapentin (NEURONTIN) 300 MG capsule TAKE TWO CAPSULES FOUR TIMES DAILY 05/15/17  Yes [name], MD  hydrALAZINE (APRESOLINE) 25 MG tablet TAKE ONE TABLET THREE TIMES DAILY 04/20/17  Yes Bensimhon, Shaune Pascal, MD  insulin aspart  (NOVOLOG) 100 UNIT/ML injection Inject 0-20 Units into the skin 3 (three) times daily with meals. . CBG 70 - 120: 0 units CBG 121 - 150: 3 units CBG 151 - 200: 4 units CBG 201 - 250: 7 units CBG 251 - 300: 11 units CBG 301 - 350: 15 units CBG 351 - 400: 20 units 03/21/16  Yes Mariel Aloe, MD  insulin glargine (LANTUS) 100 UNIT/ML injection Inject 0.3 mLs (30 Units total) into the skin daily. 03/22/16  Yes Mariel Aloe, MD  lidocaine (LIDODERM) 5 % Place 1 patch daily onto the skin. Remove & Discard patch within 12 hours or as directed by MD 12/30/16  Yes [name], MD  lidocaine-prilocaine (EMLA) cream Apply to port site prior to  being accessed 09/20/16  Yes [provider]  lipase/protease/amylase (CREON) 36000 UNITS CPEP capsule 2 po prior to snacks; 4 po prior to meals 09/20/16  Yes [provider]  LYRICA 75 MG capsule TAKE ONE CAPSULE EACH DAY 04/24/17  Yes [name], MD  meclizine (ANTIVERT) 25 MG tablet Take 25 mg 2 (two) times daily as needed by mouth for dizziness.    Yes [provider]  metFORMIN (GLUCOPHAGE) 500 MG tablet Take 500 mg by mouth daily with breakfast.   Yes [provider]  mirtazapine (REMERON) 30 MG tablet Take 1 tablet (30 mg total) by mouth at bedtime. 01/10/17  Yes Truitt Merle, MD  mupirocin ointment (BACTROBAN) 2 % Apply 1 application topically daily. 03/10/17  Yes [name], MD  ondansetron (ZOFRAN) 8 MG tablet Take 1 tablet (8 mg total) by mouth every 8 (eight) hours as needed for nausea or vomiting. 01/10/17  Yes Truitt Merle, MD  oxyCODONE (OXY IR/ROXICODONE) 5 MG immediate release tablet Take 1 tablet (5 mg total) by mouth 2 (two) times daily as needed for severe pain. 03/10/17  Yes [name], MD  potassium chloride SA (K-DUR,KLOR-CON) 20 MEQ tablet Take 20 mEq by mouth daily.   Yes [provider]  rOPINIRole (REQUIP XL) 4 MG 24 hr tablet TAKE ONE TABLET AT BEDTIME 10/20/16   Yes [name], MD  sertraline (ZOLOFT) 50 MG tablet Take 50 mg by mouth daily.   Yes [provider]  torsemide (DEMADEX) 100 MG tablet Take 0.5 tablets (50 mg total) by mouth daily. Take 100 mg (1 whole tablet) on chemo days and as needed for swelling/weight gain. 04/12/17  Yes Alma Friar, PA-C  traMADol (ULTRAM) 50 MG tablet Take 1 tablet (50 mg total) by mouth every 6 (six) hours as needed. Patient taking differently: Take 50 mg every 6 (six) hours as needed by mouth for moderate pain.  11/03/16  Yes [name], MD  aspirin EC 81 MG tablet Take 1 tablet (81 mg total) by mouth daily. 07/08/14   Rowe Clack, MD  Continuous Blood Gluc Sensor ([place]) MISC Use to check sugars 12/30/16   [name], MD  [facility] VERIO test strip USE AS INSTRUCTED UP TO 4 TIMES DAILY 11/03/16   [name], MD  rOPINIRole (REQUIP) 2 MG tablet Take 1 tablet (2 mg total) by mouth 3 (three) times daily. 02/02/17   [name], MD  silver sulfADIAZINE (SILVADENE) 1 % cream Apply 1 application daily topically. 01/03/17   [name], MD  spironolactone (ALDACTONE) 25 MG tablet Take 1 tablet (25 mg total) by mouth daily. 06/17/16 11/24/17  Larey Dresser, MD    Physical Exam: Vitals:   06/16/17 2000 06/16/17 2030 06/16/17 2209 06/16/17 2346  BP: (!) 129/40 (!) 119/46 (!) 117/53 (!) 146/63  Pulse: (!) 54 (!) 52 61 66  Resp:   15 18  Temp:   98.3 F (36.8 C)   TempSrc:   Oral   SpO2: 100% 100% 99% 100%  Weight:      Height:        Constitutional: NAD, calm, comfortable Vitals:   06/16/17 2000 06/16/17 2030 06/16/17 2209 06/16/17 2346  BP: (!) 129/40 (!) 119/46 (!) 117/53 (!) 146/63  Pulse: (!) 54 (!) 52 61 66  Resp:   15 18  Temp:   98.3 F (36.8 C)   TempSrc:   Oral   SpO2:  100% 100% 99% 100%  Weight:      Height:       Eyes: PERRL, lids and conjunctivae normal ENMT: Mucous membranes are  moist. Posterior pharynx clear of any exudate or lesions.  Neck: normal, supple, no masses, no thyromegaly Respiratory: clear to auscultation bilaterally, no wheezing, no crackles-anterior auscultation only. Normal respiratory effort. No accessory muscle use.  Cardiovascular: Regular rate and rhythm, significant 3/6 holosystolic murmur loudest pulmonary area-patient denies awareness of a murmur.  Bilateral chronic stasis changes, 2+ pedal pulses..  Abdomen: full, soft, no tenderness, no masses palpated. No hepatosplenomegaly. Bowel sounds positive.  Musculoskeletal: no clubbing / cyanosis.  Left lower extremity appears shorter than right,  right lower extremity extremely tender/sensitive just on light palpation per patient this is not new.  Skin: no rashes, lesions, ulcers. No induration Neurologic: CN 2-12 grossly intact. Sensation intact, DTR normal. Strength 5/5 upper extremities.  Lower extremity strenght right- 2/5-limited significantly by pain, 3/5- left.  Sensation intact Psychiatric: Normal judgment and insight. Alert and oriented x 3. Normal mood.   Labs on Admission: I have personally reviewed following labs and imaging studies  CBC: Recent Labs  Lab 06/14/17 1012 06/16/17 1744  WBC 6.1 10.3  NEUTROABS 3.9 7.3  HGB 10.4* 10.0*  HCT 32.3* 32.5*  MCV 83.0 85.8  PLT 285 196   Basic Metabolic Panel: Recent Labs  Lab 06/14/17 1012 06/16/17 1744  NA 138 143  K 3.7 3.6  CL 99 103  CO2 25 27  GLUCOSE 307* 197*  BUN 41* 35*  CREATININE 1.29* 1.05*  CALCIUM 9.9 8.7*   Liver Function Tests: Recent Labs  Lab 06/14/17 1012  AST 21  ALT 16  ALKPHOS 285*  BILITOT 0.3  PROT 7.7  ALBUMIN 3.1*   CBG: Recent Labs  Lab 06/16/17 2339  GLUCAP 113*   Urine analysis:    Component Value Date/Time   COLORURINE YELLOW 04/26/2017 1558   APPEARANCEUR TURBID (A) 04/26/2017 1558   LABSPEC 1.013 04/26/2017 1558   PHURINE 6.0 04/26/2017 1558   GLUCOSEU NEGATIVE 04/26/2017 1558    GLUCOSEU NEGATIVE 11/04/2015 0956   HGBUR SMALL (A) 04/26/2017 1558   BILIRUBINUR NEGATIVE 04/26/2017 1558   BILIRUBINUR Neg 11/03/2016 1701   KETONESUR NEGATIVE 04/26/2017 1558   PROTEINUR 30 (A) 04/26/2017 1558   UROBILINOGEN 0.2 11/03/2016 1701   UROBILINOGEN 0.2 11/04/2015 0956   NITRITE NEGATIVE 04/26/2017 1558   LEUKOCYTESUR LARGE (A) 04/26/2017 1558    Radiological Exams on Admission: Mr Lumbar Spine W Wo Contrast  Result Date: 06/16/2017 CLINICAL DATA:  72 y/o F; lower back pain radiating to the left buttocks. History of diabetes and pancreatic cancer. EXAM: MRI LUMBAR SPINE WITHOUT AND WITH CONTRAST TECHNIQUE: Multiplanar and multiecho pulse sequences of the lumbar spine were obtained without and with intravenous contrast. CONTRAST:  73mL MULTIHANCE GADOBENATE DIMEGLUMINE 529 MG/ML IV SOLN COMPARISON:  05/03/2017 CT abdomen and pelvis. 06/24/2010 MRI of the lumbar spine. FINDINGS: Segmentation:  Standard. Alignment:  Physiologic. Vertebrae: Mild edema and enhancement within the opposing L3 and L4 endplates. Small bilateral L4-5 facet effusions. Mild edema and enhancement of the L4 and L5 spinous processes with pseudoarticulation. No loss of vertebral body height Conus medullaris and cauda equina: Conus extends to the L1 level. Conus and cauda equina appear normal. Paraspinal and other soft tissues: Subcentimeter cysts within the kidneys bilaterally. Disc levels: L1-2: No significant disc displacement, foraminal stenosis, or canal stenosis. L2-3: No significant disc displacement, foraminal stenosis, or canal stenosis. L3-4: Small  disc bulge eccentric to the right and mild facet hypertrophy. Mild right foraminal stenosis. No canal stenosis. L4-5: Moderate disc bulge with facet and ligamentum flavum hypertrophy. Mild bilateral foraminal stenosis. Severe canal stenosis. L5-S1: Small disc bulge eccentric to the left with left-sided foraminal and extraforaminal endplate marginal osteophytes as  well as left-greater-than-right facet hypertrophy. Mild right and moderate to severe left foraminal stenosis. Mild canal stenosis. IMPRESSION: 1. Mild opposing L3-4 endplate edema, likely degenerative. Small L4-5 facet effusions, also likely degenerative. 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 2. Lumbar spondylosis greatest at the L4-5 and L5-S1 levels. 3. Severe multifactorial L4-5 canal stenosis. 4. Moderate to severe left L5-S1 foraminal stenosis. Multilevel mild foraminal stenosis. Electronically Signed   By: Kristine Garbe M.D.   On: 06/16/2017 22:19    EKG: Independently reviewed.   Assessment/Plan Active Problems:   Intractable back pain   Intractable back pain-acute on chronic back pain.  MRI w/Wo contrast L4-L5 severe multifactorial canal stenosis, L5-S1 moderate to severe foraminal stenosis. -held off on trial of steroids with DM, and MRI not noting any spinal impingement -Morphine -If pain not controlled consider neurosurgery consult, may benefit from steroid injections inpatient or outpatient, may not be candidate for surgery with pancreatic cancer hx. 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 Burr Medico with Oncology, s/p radiation and chemo.  Chemotherapy was discontinued after sixth cycle 04/26/17-due to low benefit intolerance. -Follow-up with oncology  Murmur at least grade 3/6 systolic pulmonary area, last echo 2017 EF 65%- 70%, GiDD.  Follows with Dr. Aundra Dubin for diastolic CHF, 02/930 notes 1/6 SEM RUSB. - Will get updated Echo.  Diastolic CHF-appears euvolemic.  Per cardiology notes patient's torsemide was on hold.  Patient tells me she has been taking it -will hold torsemide for now -Continue home aspirin Coreg hydralazine spironolactone  Restless legs continue home ropinirole  Polypharmacy   DVT prophylaxis: Lovenox Code Status: Full Family Communication: None at bedside Disposition Plan: per  rounding team Consults called: None, consider neur-surg consult pending clinical status in a.m Admission status: Obs, med-surg   Bethena Roys MD [facility] Pager [phone] From 6PM-2AM.  Otherwise please contact night-coverage [email or web address] Password [facility]  06/17/2017, 12:27 AM

## 2017-06-17 NOTE — Progress Notes (Addendum)
Pt is hard to arouse.Can"t follow verbal commands . VS T 96.7 AX  , HR 69, r 24, bP 137/60. Page sent tp Dr Posey Pronto.   Morphine 2 mg IV at 0926 , Dilaudid 0.5 mg 1227,  Oxycodon 5 mg PO 1537. She is snoring.   1822 O2 sat is 93 % and 92 % on room air. Per  Dr Posey Pronto  unless 02 Sats are less 92%. No need to call him

## 2017-06-17 NOTE — Progress Notes (Signed)
[facility] PROGRESS NOTE  Patient: Melissa Myers JQG:[id number]   PCP: [name], MD DOB: [birth year]   DOA: 06/16/2017   DOS: 06/17/2017    Subjective: Patient became drowsy after receiving narcotics.  Objective:  Vitals:   06/17/17 1808 06/17/17 1825  BP: 137/60   Pulse: 67   Resp:    Temp: 97.6 F (36.4 C)   SpO2: (!) 83% 93%     Assessment and plan: Polypharmacy. Initially received a call the patient was in uncontrolled pain despite receiving IV morphine. Resume patient's gabapentin on top of patient's Lyrica as patient's home regimen per patient's information. Also resume patient's home OxyIR. Received baclofen 5 mg at 337, did not receive gabapentin so far. Received Lyrica and baclofen 8:30 in the morning. Also received 2 mg of Requip at the same time.  No further dose. Received 50 mg Zoloft 80 8:30 in the morning as well.  Suspecting excessive sleepiness from receiving OxyIR 5 mg, And baclofen. At present will DC baclofen and OxyIR.  Reduce Dilaudid to morphine again.  Continue only Lyrica.  Reduce the dose of Requip.  Patient's home dose is 10 mg a day will only use 2 mg twice daily. We will keep the patient n.p.o. at present until fully awake. Vitals every 2 hours. Avoiding the use of Narcan given patient's presentation with severe uncontrolled back pain as well as patient's reliance on chronic pain medications along with relatively stable vitals with stable blood pressure and stable oxygenation on 2 L of oxygen. Recommended vitals every hour for next 2 hours.    Author: Berle Mull, MD [facility] Pager: [phone] 06/17/2017 6:30 PM   If 7PM-7AM, please contact night-coverage at [email or web address], password [facility]

## 2017-06-17 NOTE — Progress Notes (Signed)
[facility] PLAN OF CARE NOTE Patient: Melissa Myers YNW:[id number]   PCP: [name], MD DOB: 05-06-1945   DOA: 06/16/2017   DOS: 06/17/2017    Patient was admitted by my colleague earlier on 06/17/2017. I have reviewed the H&P as well as assessment and plan and agree with the same. Important changes in the plan are listed below.  Plan of care: Active Problems:   RESTLESS LEGS SYNDROME   Essential hypertension   Chronic diastolic heart failure (HCC)   IDDM (insulin dependent diabetes mellitus) (HCC)   Chronic pain syndrome   MDD (major depressive disorder), single episode, severe (HCC)   Malignant neoplasm of head of pancreas (HCC)   Intractable back pain   Chronic back pain. Resuming patient's home regimen likely back spasm. Pain not controlled, changed to IV Dilaudid.  Application: Left leg. X-ray femur unable to differentiate between a fracture on the left femur. CT for further work-up.   Author: Berle Mull, MD [facility] Pager: [phone] 06/17/2017 3:00 PM   If 7PM-7AM, please contact night-coverage at [email or web address], password [facility]

## 2017-06-17 NOTE — ED Notes (Signed)
ED TO INPATIENT HANDOFF REPORT  Name/Age/Gender Melissa Myers 72 y.o. female  Code Status Code Status History    Date Active Date Inactive Code Status Order ID Comments User Context   05/19/2016 2122 05/24/2016 2139 Full Code [id number]  Melissa Milan, MD Inpatient   05/05/2016 0645 05/13/2016 1748 Full Code [id number]  Melissa Dada, MD Inpatient   03/15/2016 1742 03/21/2016 1708 Full Code [id number]  Melissa Millers, MD Inpatient   10/09/2015 0135 10/10/2015 1524 Full Code [id number]  Melissa Kocher, MD Inpatient   04/14/2015 0649 04/18/2015 1445 Full Code [id number]  Melissa Patience, MD Inpatient   01/22/2015 1306 01/26/2015 1628 Full Code [id number]  Melissa [place], NP Inpatient   11/15/2014 0051 11/20/2014 1651 Full Code [id number]  Melissa Begin, MD ED   10/23/2014 0054 10/26/2014 1837 Full Code [id number]  Melissa Griffins, MD ED   12/05/2013 1614 12/11/2013 1707 Full Code [id number]  Melissa , NP Inpatient   07/11/2012 1933 07/16/2012 2031 Full Code [id number]  Melissa Lennox, MD Inpatient      Home/SNF/Other Home  Chief Complaint back pains  Level of Care/Admitting Diagnosis ED Disposition    ED Disposition Condition [facility] Area: [facility] [[id number]]  Level of Care: Med-Surg [16]  Diagnosis: Intractable back pain [973532]  Admitting Physician: Bethena Roys [[id number]]  Attending Physician: Bethena Roys [phone]  PT Class (Do Not Modify): Observation [104]  PT Acc Code (Do Not Modify): Observation [10022]       Medical History Past Medical History:  Diagnosis Date  . Anemia   . Cancer [facility])    Pancreatic  . Cellulitis    LOWER EXTREMITIES  . Chronic diarrhea    a/w nausea - felt related to IBS  . Deaf    left side only  . Diastolic CHF ([place])   . Disc degeneration, lumbar   . Family history of breast cancer   . Family history of ovarian cancer   . Family history of stomach cancer   . GERD  (gastroesophageal reflux disease)   . Hyperlipidemia    hx rhabdo on statins  . Hypertension   . Neuropathy    feet, toes and fingers  . On home oxygen therapy    uses oxygen 2 liters min per [place] at night and prn during day  . OSA (obstructive sleep apnea)    05/2009 sleep study - refuses CPAP  . Osteoarthritis   . RLS (restless legs syndrome)   . Shortness of breath    chronic  . Stasis dermatitis   . Type II or unspecified type diabetes mellitus without mention of complication, not stated as uncontrolled    insulin dep    Allergies Allergies  Allergen Reactions  . Morphine Other (See Comments) and Nausea Only    GI upset and headaches GI upset and headaches  . Statins Other (See Comments)    Statin drugs cause muscle pain / "muscle damage"--was told by MD not to take rhabdomyolysis Statin drugs cause muscle pain / "muscle damage"--was told by MD not to take  . Sulfa Antibiotics Diarrhea and Nausea Only  . Cymbalta [Duloxetine Hcl] Other (See Comments)    Restless leg syndrome Restless leg syndrome, rash, itching, and diarrhea  . Sulfasalazine Diarrhea  . Levemir [Insulin Detemir] Itching  . Zinc Swelling, Rash, Hives and Other (See Comments)    IV Location/Drains/Wounds Patient Lines/Drains/Airways Status   Active Line/Drains/Airways  Name:   Placement date:   Placement time:   Site:   Days:   Implanted Port Right Chest   -    -    Chest      Peripheral IV 06/16/17 Left Antecubital   06/16/17    1624    Antecubital   1   Closed System Drain 1 Right RUQ Other (Comment) 10 Fr.   05/07/16    1025    RUQ   406   External Urinary Catheter   06/16/17    1740    -   1   Wound / Incision (Open or Dehisced) 03/15/16 Diabetic ulcer Toe (Comment  which one) Right r greta toe,under,outer aspect,small ,dry,black caloous-like area   03/15/16    1935    Toe (Comment  which one)   459          Labs/Imaging Results for orders placed or performed during the hospital encounter of  06/16/17 (from the past 48 hour(s))  Basic metabolic panel     Status: Abnormal   Collection Time: 06/16/17  5:44 PM  Result Value Ref Range   Sodium 143 135 - 145 mmol/L   Potassium 3.6 3.5 - 5.1 mmol/L   Chloride 103 101 - 111 mmol/L   CO2 27 22 - 32 mmol/L   Glucose, Bld 197 (H) 65 - 99 mg/dL   BUN 35 (H) 6 - 20 mg/dL   Creatinine, Ser 1.05 (H) 0.44 - 1.00 mg/dL   Calcium 8.7 (L) 8.9 - 10.3 mg/dL   GFR calc non Af Amer 52 (L) >60 mL/min   GFR calc Af Amer >60 >60 mL/min    Comment: (NOTE) The eGFR has been calculated using the CKD EPI equation. This calculation has not been validated in all clinical situations. eGFR's persistently <60 mL/min signify possible Chronic Kidney Disease.    Anion gap 13 5 - 15    Comment: Performed at [facility], [place] [address], [place], [place] [address]  CBC with Differential/Platelet     Status: Abnormal   Collection Time: 06/16/17  5:44 PM  Result Value Ref Range   WBC 10.3 4.0 - 10.5 K/uL   RBC 3.79 (L) 3.87 - 5.11 MIL/uL   Hemoglobin 10.0 (L) 12.0 - 15.0 g/dL   HCT 32.5 (L) 36.0 - 46.0 %   MCV 85.8 78.0 - 100.0 fL   MCH 26.4 26.0 - 34.0 pg   MCHC 30.8 30.0 - 36.0 g/dL   RDW 16.6 (H) 11.5 - 15.5 %   Platelets 268 150 - 400 K/uL   Neutrophils Relative % 71 %   Neutro Abs 7.3 1.7 - 7.7 K/uL   Lymphocytes Relative 21 %   Lymphs Abs 2.2 0.7 - 4.0 K/uL   Monocytes Relative 7 %   Monocytes Absolute 0.7 0.1 - 1.0 K/uL   Eosinophils Relative 1 %   Eosinophils Absolute 0.1 0.0 - 0.7 K/uL   Basophils Relative 0 %   Basophils Absolute 0.0 0.0 - 0.1 K/uL    Comment: Performed at [facility], [place] [address], [place], [place] [address]  CBG monitoring, ED     Status: Abnormal   Collection Time: 06/16/17 11:39 PM  Result Value Ref Range   Glucose-Capillary 113 (H) 65 - 99 mg/dL   *Note: Due to a large number of results and/or encounters for the requested time period, some results have not been displayed. A  complete set of results can be found in  Results Review.   Melissa Myers W Wo Contrast  Result Date: 06/16/2017 CLINICAL DATA:  72 y/o F; lower back pain radiating to the left buttocks. History of diabetes and pancreatic cancer. EXAM: MRI LUMBAR Myers WITHOUT AND WITH CONTRAST TECHNIQUE: Multiplanar and multiecho pulse sequences of the lumbar Myers were obtained without and with intravenous contrast. CONTRAST:  70m MULTIHANCE GADOBENATE DIMEGLUMINE 529 MG/ML IV SOLN COMPARISON:  05/03/2017 CT abdomen and pelvis. 06/24/2010 MRI of the lumbar Myers. FINDINGS: Segmentation:  Standard. Alignment:  Physiologic. Vertebrae: Mild edema and enhancement within the opposing L3 and L4 endplates. Small bilateral L4-5 facet effusions. Mild edema and enhancement of the L4 and L5 spinous processes with pseudoarticulation. No loss of vertebral body height Conus medullaris and cauda equina: Conus extends to the L1 level. Conus and cauda equina appear normal. Paraspinal and other soft tissues: Subcentimeter cysts within the kidneys bilaterally. Disc levels: L1-2: No significant disc displacement, foraminal stenosis, or canal stenosis. L2-3: No significant disc displacement, foraminal stenosis, or canal stenosis. L3-4: Small disc bulge eccentric to the right and mild facet hypertrophy. Mild right foraminal stenosis. No canal stenosis. L4-5: Moderate disc bulge with facet and ligamentum flavum hypertrophy. Mild bilateral foraminal stenosis. Severe canal stenosis. L5-S1: Small disc bulge eccentric to the left with left-sided foraminal and extraforaminal endplate marginal osteophytes as well as left-greater-than-right facet hypertrophy. Mild right and moderate to severe left foraminal stenosis. Mild canal stenosis. IMPRESSION: 1. Mild opposing L3-4 endplate edema, likely degenerative. Small L4-5 facet effusions, also likely degenerative. 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 2. Lumbar spondylosis greatest at the L4-5 and L5-S1  levels. 3. Severe multifactorial L4-5 canal stenosis. 4. Moderate to severe left L5-S1 foraminal stenosis. Multilevel mild foraminal stenosis. Electronically Signed   By: LKristine GarbeM.D.   On: 06/16/2017 22:19    Pending Labs Unresulted Labs (From admission, onward)   None      Vitals/Pain Today's Vitals   06/16/17 2030 06/16/17 2206 06/16/17 2209 06/16/17 2346  BP: (!) 119/46  (!) 117/53 (!) 146/63  Pulse: (!) 52  61 66  Resp:   15 18  Temp:   98.3 F (36.8 C)   TempSrc:   Oral   SpO2: 100%  99% 100%  Weight:      Height:      PainSc:  1  1      Isolation Precautions No active isolations  Medications Medications  morphine 2 MG/ML injection 2 mg (has no administration in time range)  gadobenate dimeglumine (MULTIHANCE) injection 20 mL (20 mLs Intravenous Contrast Given 06/16/17 2203)    Mobility walks with device

## 2017-06-17 NOTE — ED Notes (Signed)
Called floor for report waiting a call back

## 2017-06-17 NOTE — Progress Notes (Signed)
  Echocardiogram 2D Echocardiogram has been performed.  [name] F 06/17/2017, 3:42 PM

## 2017-06-17 NOTE — Evaluation (Addendum)
Physical Therapy Evaluation Patient Details Name: Melissa Myers MRN: [id number] DOB: 1945-12-29 Today's Date: 06/17/2017   History of Present Illness  72 yo female with onset of spinal pain and expansion to B plantar surface of feet from spondylosis and spinal stenosis from L4-S1.  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  Has PMHx: pancreatic cancer, euvolemia with CHF, HTN, CHF, RLS, spinal osteophytes.   Clinical Impression  Pt was seen for evaluation of mobility with restrictions of pain in LE's and legs of 10/10.   Nursing in to give meds and pt had asked to get onto [facility]  However, had episode of incontinence and then finally sat to void a little afterward on BSC.  Pt has received meds and was repositioned with PT and nursing to increase comfort in bed.  Will continue acutely and recommend SNF as she is very restricted to move with her current pain.    Follow Up Recommendations SNF    Equipment Recommendations  Rolling walker with 5" wheels(if hers is not in good repair)    Recommendations for Other Services       Precautions / Restrictions Precautions Precautions: Back Precaution Booklet Issued: No Precaution Comments: pt is very painful and seems confused Restrictions Weight Bearing Restrictions: No      Mobility  Bed Mobility Overal bed mobility: Needs Assistance Bed Mobility: Supine to Sit;Sit to Supine     Supine to sit: Mod assist Sit to supine: Mod assist   General bed mobility comments: help to roll and bring legs off bed, then lift trunk from bed.  Back to bed difficult as pt impulsively leans backward despite vc's to go to side  Transfers Overall transfer level: Needs assistance Equipment used: Rolling walker (2 wheeled);1 person hand held assist Transfers: Sit to/from Stand Sit to Stand: Min assist;Mod assist         General transfer comment: struggling with pain on feet to stand, min to steady once mod power up  Ambulation/Gait              General Gait Details: unable   Stairs            Wheelchair Mobility    Modified Rankin (Stroke Patients Only)       Balance Overall balance assessment: Needs assistance Sitting-balance support: Feet supported Sitting balance-Leahy Scale: Good     Standing balance support: Bilateral upper extremity supported;During functional activity Standing balance-Leahy Scale: Poor                               Pertinent Vitals/Pain Pain Assessment: 0-10 Pain Score: 10-Worst pain ever Pain Location: bottoms of feet and back Pain Descriptors / Indicators: Sharp;Shooting;Tender Pain Intervention(s): Limited activity within patient's tolerance;Monitored during session;Repositioned;RN gave pain meds during session    [place] expects to be discharged to:: Private residence Living Arrangements: Spouse/significant other Available Help at Discharge: Family;Available 24 hours/day Type of Home: House       Home Layout: One level Home Equipment: Walker - 2 wheels Additional Comments: pt was independent on RW prior to her admission and has no recent reported falls    Prior Function Level of Independence: Independent with assistive device(s)               Hand Dominance        Extremity/Trunk Assessment   Upper Extremity Assessment Upper Extremity Assessment: Overall WFL for tasks assessed  Lower Extremity Assessment Lower Extremity Assessment: Generalized weakness(pain is restricting use of legs)    Cervical / Trunk Assessment Cervical / Trunk Assessment: Other exceptions(has degenerative changes on spine) Cervical / Trunk Exceptions: "kissing spines"  Communication   Communication: No difficulties  Cognition Arousal/Alertness: Awake/alert Behavior During Therapy: Anxious;Agitated Overall Cognitive Status: Difficult to assess                                        General Comments General  comments (skin integrity, edema, etc.): Pt is incontinent of bowels with standing     Exercises     Assessment/Plan    PT Assessment Patient needs continued PT services  PT Problem List Decreased strength;Decreased range of motion;Decreased activity tolerance;Decreased balance;Decreased mobility;Decreased coordination;Decreased cognition;Decreased knowledge of use of DME;Decreased safety awareness;Obesity;Pain       PT Treatment Interventions DME instruction;Gait training;Functional mobility training;Therapeutic activities;Therapeutic exercise;Balance training;Neuromuscular re-education;Patient/family education    PT Goals (Current goals can be found in the Care Plan section)  Acute Rehab PT Goals Patient Stated Goal: to get up to [facility] PT Goal Formulation: Patient unable to participate in goal setting Time For Goal Achievement: 07/01/17 Potential to Achieve Goals: Good    Frequency Min 2X/week   Barriers to discharge Other (comment)(pt currently cannot walk and two people are helping her move) two people to move on the bed    Co-evaluation               AM-PAC PT "6 Clicks" Daily Activity  Outcome Measure Difficulty turning over in bed (including adjusting bedclothes, sheets and blankets)?: Unable Difficulty moving from lying on back to sitting on the side of the bed? : Unable Difficulty sitting down on and standing up from a chair with arms (e.g., wheelchair, bedside commode, etc,.)?: A Lot Help needed moving to and from a bed to chair (including a wheelchair)?: A Lot Help needed walking in hospital room?: Total Help needed climbing 3-5 steps with a railing? : Total 6 Click Score: 8    End of Session Equipment Utilized During Treatment: Gait belt Activity Tolerance: Patient limited by pain Patient left: in bed;with call bell/phone within reach;with nursing/sitter in room Nurse Communication: Mobility status;Patient requests pain meds PT Visit Diagnosis: Unsteadiness  on feet (R26.81);Muscle weakness (generalized) (M62.81);Pain Pain - Right/Left: (B ) Pain - part of body: Ankle and joints of foot(back)    Time: 2707-8675 PT Time Calculation (min) (ACUTE ONLY): 32 min   Charges:   PT Evaluation $PT Eval Moderate Complexity: 1 Mod PT Treatments $Therapeutic Activity: 8-22 mins   PT G Codes:   PT G-Codes **NOT FOR INPATIENT CLASS** Functional Assessment Tool Used: AM-PAC 6 Clicks Basic Mobility    Ramond Dial 06/17/2017, 10:43 AM   Mee Hives, PT MS Acute Rehab Dept. Number: [place]

## 2017-06-18 DIAGNOSIS — R52 Pain, unspecified: Secondary | ICD-10-CM | POA: Diagnosis not present

## 2017-06-18 LAB — GLUCOSE, CAPILLARY
GLUCOSE-CAPILLARY: 244 mg/dL — AB (ref 65–99)
GLUCOSE-CAPILLARY: 299 mg/dL — AB (ref 65–99)
Glucose-Capillary: 157 mg/dL — ABNORMAL HIGH (ref 65–99)
Glucose-Capillary: 277 mg/dL — ABNORMAL HIGH (ref 65–99)
Glucose-Capillary: 275 mg/dL — ABNORMAL HIGH (ref 65–99)

## 2017-06-18 LAB — COMPREHENSIVE METABOLIC PANEL
ALBUMIN: 2.6 g/dL — AB (ref 3.5–5.0)
ALK PHOS: 437 U/L — AB (ref 38–126)
ALT: 36 U/L (ref 14–54)
ANION GAP: 13 (ref 5–15)
AST: 72 U/L — ABNORMAL HIGH (ref 15–41)
BILIRUBIN TOTAL: 0.7 mg/dL (ref 0.3–1.2)
BUN: 37 mg/dL — ABNORMAL HIGH (ref 6–20)
CALCIUM: 8.7 mg/dL — AB (ref 8.9–10.3)
CO2: 27 mmol/L (ref 22–32)
Chloride: 104 mmol/L (ref 101–111)
Creatinine, Ser: 1.03 mg/dL — ABNORMAL HIGH (ref 0.44–1.00)
GFR, EST NON AFRICAN AMERICAN: 53 mL/min — AB (ref >60)
GLUCOSE: 156 mg/dL — AB (ref 65–99)
POTASSIUM: 3.6 mmol/L (ref 3.5–5.1)
Sodium: 144 mmol/L (ref 135–145)
TOTAL PROTEIN: 6.5 g/dL (ref 6.5–8.1)

## 2017-06-18 LAB — CBC WITH DIFFERENTIAL/PLATELET
BASOS PCT: 0 %
Basophils Absolute: 0 10*3/uL (ref 0.0–0.1)
Eosinophils Absolute: 0.1 10*3/uL (ref 0.0–0.7)
Eosinophils Relative: 1 %
HEMATOCRIT: 33.8 % — AB (ref 36.0–46.0)
HEMOGLOBIN: 10.3 g/dL — AB (ref 12.0–15.0)
LYMPHS ABS: 2 10*3/uL (ref 0.7–4.0)
Lymphocytes Relative: 27 %
MCH: 26.2 pg (ref 26.0–34.0)
MCHC: 30.5 g/dL (ref 30.0–36.0)
MCV: 86 fL (ref 78.0–100.0)
MONOS PCT: 9 %
Monocytes Absolute: 0.7 10*3/uL (ref 0.1–1.0)
NEUTROS ABS: 4.7 10*3/uL (ref 1.7–7.7)
NEUTROS PCT: 63 %
Platelets: 281 10*3/uL (ref 150–400)
RBC: 3.93 MIL/uL (ref 3.87–5.11)
RDW: 16.4 % — AB (ref 11.5–15.5)
WBC: 7.5 10*3/uL (ref 4.0–10.5)

## 2017-06-18 LAB — TSH: TSH: 0.6 u[IU]/mL (ref 0.350–4.500)

## 2017-06-18 MED ORDER — MORPHINE SULFATE (PF) 2 MG/ML IV SOLN
2.0000 mg | INTRAVENOUS | Status: DC | PRN
  Administered 2017-06-19 (×3): 2 mg via INTRAVENOUS
  Filled 2017-06-18 (×3): qty 1

## 2017-06-18 MED ORDER — TORSEMIDE 10 MG PO TABS
10.0000 mg | ORAL_TABLET | Freq: Every day | ORAL | Status: DC
  Administered 2017-06-18 – 2017-06-19 (×2): 10 mg via ORAL
  Filled 2017-06-18 (×3): qty 1

## 2017-06-18 MED ORDER — POLYETHYLENE GLYCOL 3350 17 G PO PACK
17.0000 g | PACK | Freq: Every day | ORAL | Status: DC
  Administered 2017-06-18 – 2017-06-20 (×3): 17 g via ORAL
  Filled 2017-06-18 (×3): qty 1

## 2017-06-18 MED ORDER — BACLOFEN 10 MG PO TABS
5.0000 mg | ORAL_TABLET | Freq: Two times a day (BID) | ORAL | Status: DC
  Administered 2017-06-18 – 2017-06-20 (×5): 5 mg via ORAL
  Filled 2017-06-18 (×5): qty 1

## 2017-06-18 MED ORDER — INSULIN ASPART 100 UNIT/ML ~~LOC~~ SOLN
0.0000 [IU] | Freq: Every day | SUBCUTANEOUS | Status: DC
  Administered 2017-06-18: 3 [IU] via SUBCUTANEOUS

## 2017-06-18 MED ORDER — SPIRONOLACTONE 25 MG PO TABS
25.0000 mg | ORAL_TABLET | Freq: Every day | ORAL | Status: DC
  Administered 2017-06-18 – 2017-06-20 (×3): 25 mg via ORAL
  Filled 2017-06-18 (×3): qty 1

## 2017-06-18 MED ORDER — INSULIN ASPART 100 UNIT/ML ~~LOC~~ SOLN
0.0000 [IU] | Freq: Three times a day (TID) | SUBCUTANEOUS | Status: DC
  Administered 2017-06-18 – 2017-06-19 (×3): 3 [IU] via SUBCUTANEOUS
  Administered 2017-06-19: 2 [IU] via SUBCUTANEOUS
  Administered 2017-06-20: 8 [IU] via SUBCUTANEOUS
  Administered 2017-06-20: 2 [IU] via SUBCUTANEOUS

## 2017-06-18 MED ORDER — HYDROCODONE-ACETAMINOPHEN 5-325 MG PO TABS
1.0000 | ORAL_TABLET | Freq: Four times a day (QID) | ORAL | Status: DC | PRN
  Administered 2017-06-20: 1 via ORAL
  Filled 2017-06-18: qty 1

## 2017-06-18 NOTE — Progress Notes (Signed)
Pt is alert to self, BD, and the president of the [place]. She stated  that she was unaware of sleeping all afternoon 4/28 and thru the night. She is thirsty and hungry. I sent a text to Dr Posey Pronto that she was awake and alert and I requested a diet order

## 2017-06-18 NOTE — Progress Notes (Signed)
[facility] Progress Note  Patient: Melissa Myers VQM:[id number]   PCP: [name], MD DOB: Apr 08, 1945   DOA: 06/16/2017   DOS: 06/18/2017   Date of Service: the patient was seen and examined on 06/18/2017  Subjective: Awake, back to baseline but still confused.  No nausea no vomiting.  Back pain is well controlled.  No diarrhea no constipation.  No burning urination.  Brief hospital course: Pt. with PMH of pancreatic cancer, HTN, diastolic CHF, chronic pain restless leg syndrome; admitted on 06/16/2017, presented with complaint of back pain, was found to have back spasm causing uncontrolled pain. Currently further plan is continue current pain control regimen.  Assessment and Plan: 1.  Intractable acute on chronic back pain. Initially in the past patient had pain radiating to the right side, this time the pain was radiating to the left side.  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 No evidence of severe neuropathy on examination. Patient was given IV morphine without any benefit.  IV Dilaudid was provided as well as home regimen for pain control for resumed.  Patient developed severe drowsiness. We will continue with Lyrica 75 mg twice daily, baclofen 5 mg twice daily.  Requip 2 mg 3 times daily.  Discontinue gabapentin. Change IV Dilaudid back to IV morphine. PT consult. CT femur left negative for any acute fracture.  2.  Acute toxic encephalopathy. Likely in the setting of medications. Primarily polypharmacy due to multiple psychotropic medications. Discontinue gabapentin, Remeron, reduce the dose of baclofen, Requip. Also reduce pain control. Monitor.  3.  Pancreatic cancer.  Stage Ib Recently taken off of chemotherapy due to low benefit. Possibly association with patient's current presentation with pain control. Monitor for now.  4.  Chronic diastolic CHF. Patient on torsemide at  home, Coreg, hydralazine and Aldactone. Also on 81 mg aspirin at home. Currently holding torsemide continue the rest of the medication.  5.  Restless leg syndrome. Patient is on 10 mg of Requip daily at home. Given that her severe encephalopathy will currently continue 6 mg daily.  Divided doses.  6.  Type 2 diabetes mellitus. Blood sugars are elevated. We will continue with sliding scale for today. Monitor.  7.?  PFO. Cardiac murmur. On presentation patient was identified to have a cardiac murmur, echocardiogram was performed. 65 to 70% EF, grade 2 diastolic dysfunction with concentric hypertrophy. Mild MS, mild AS. A PFO cannot be excluded based on the information available and therefore bubble study is currently recommended.  We will check that.  Diet: Cardiac and carb modified diet, aspiration precaution. DVT Prophylaxis: subcutaneous Heparin  Advance goals of care discussion: full code  Family Communication: no family was present at bedside, at the time of interview.  Disposition:  Discharge to likely home tomorrow, pending improvement in pain as well as PT eval..  Consultants: none Procedures: Echocardiogram   Antibiotics: Anti-infectives (From admission, onward)   None       Objective: Physical Exam: Vitals:   06/17/17 1945 06/17/17 2047 06/18/17 0449 06/18/17 1257  BP: 128/61 (!) 130/59 138/67 133/68  Pulse: 64 (!) 59 65 60  Resp:  20 16 16   Temp: 98.5 F (36.9 C) 99.2 F (37.3 C) 98.1 F (36.7 C) 98.7 F (37.1 C)  TempSrc: Oral Oral Oral Oral  SpO2: 94% 94% 91% 95%  Weight:      Height:        Intake/Output Summary (Last 24  hours) at 06/18/2017 1555 Last data filed at 06/18/2017 0935 Gross per 24 hour  Intake -  Output 1300 ml  Net -1300 ml   Filed Weights   06/16/17 1621 06/17/17 0318  Weight: 92.1 kg (203 lb) 88.8 kg (195 lb 12.3 oz)   General: Alert, Awake and Oriented to Place and Person. Appear in mild distress, affect appropriate eyes:  PERRL, Conjunctiva normal ENT: Oral Mucosa clear moist. Neck: no JVD, no Abnormal Mass Or lumps Cardiovascular: S1 and S2 Present, aortic systolic Murmur, Peripheral Pulses Present Respiratory: normal respiratory effort, Bilateral Air entry equal and Decreased, no use of accessory muscle, Clear to Auscultation, no Crackles, no wheezes Abdomen: Bowel Sound pesent, Soft and no tenderness, no hernia Skin: no redness, no Rash, no induration Extremities: no Pedal edema, no calf tenderness Neurologic: Grossly no focal neuro deficit. Bilaterally Equal motor strength  Data Reviewed: CBC: Recent Labs  Lab 06/14/17 1012 06/16/17 1744 06/18/17 0831  WBC 6.1 10.3 7.5  NEUTROABS 3.9 7.3 4.7  HGB 10.4* 10.0* 10.3*  HCT 32.3* 32.5* 33.8*  MCV 83.0 85.8 86.0  PLT 285 268 161   Basic Metabolic Panel: Recent Labs  Lab 06/14/17 1012 06/16/17 1744 06/18/17 0831  NA 138 143 144  K 3.7 3.6 3.6  CL 99 103 104  CO2 25 27 27   GLUCOSE 307* 197* 156*  BUN 41* 35* 37*  CREATININE 1.29* 1.05* 1.03*  CALCIUM 9.9 8.7* 8.7*    Liver Function Tests: Recent Labs  Lab 06/14/17 1012 06/18/17 0831  AST 21 72*  ALT 16 36  ALKPHOS 285* 437*  BILITOT 0.3 0.7  PROT 7.7 6.5  ALBUMIN 3.1* 2.6*   No results for input(s): LIPASE, AMYLASE in the last 168 hours. No results for input(s): AMMONIA in the last 168 hours. Coagulation Profile: No results for input(s): INR, PROTIME in the last 168 hours. Cardiac Enzymes: No results for input(s): CKTOTAL, CKMB, CKMBINDEX, TROPONINI in the last 168 hours. BNP (last 3 results) No results for input(s): PROBNP in the last 8760 hours. CBG: Recent Labs  Lab 06/17/17 1143 06/17/17 1756 06/18/17 0735 06/18/17 1204 06/18/17 1445  GLUCAP 271* 233* 157* 244* 277*   Studies: Ct Femur Left Wo Contrast  Result Date: 06/17/2017 CLINICAL DATA:  Left leg pain.  History of pancreatic cancer. EXAM: CT OF THE LOWER LEFT EXTREMITY WITHOUT CONTRAST TECHNIQUE:  Multidetector CT imaging of the lower left extremity was performed according to the standard protocol. COMPARISON:  Pelvic radiograph 06/17/2017 FINDINGS: Bones/Joint/Cartilage The left femoral head is normally located. There are moderate hip joint degenerative changes but no fracture or AVN. The visualized left hemipelvis is intact. The left femur is intact. No fracture or bone lesion. There are advanced degenerative changes noted at the left knee joint. The surrounding hip and pelvic musculature appear grossly normal. No obvious muscle tear or hematoma. The thigh muscles appear normal. IMPRESSION: No acute bony findings. Moderate degenerative changes in the hip and advanced degenerative changes involving the knee. Electronically Signed   By: Marijo Sanes M.D.   On: 06/17/2017 17:12    Scheduled Meds: . aspirin EC  81 mg Oral Daily  . baclofen  5 mg Oral BID  . carvedilol  25 mg Oral BID WC  . enoxaparin (LOVENOX) injection  40 mg Subcutaneous Q24H  . insulin aspart  0-15 Units Subcutaneous TID WC  . insulin aspart  0-5 Units Subcutaneous QHS  . insulin glargine  20 Units Subcutaneous Daily  . lidocaine  1  patch Transdermal Q24H  . lipase/protease/amylase  36,000 Units Oral TID AC  . polyethylene glycol  17 g Oral Daily  . potassium chloride SA  20 mEq Oral Daily  . pregabalin  75 mg Oral Daily  . rOPINIRole  2 mg Oral BID  . spironolactone  25 mg Oral Daily  . torsemide  10 mg Oral Daily   Continuous Infusions: PRN Meds: baclofen, HYDROcodone-acetaminophen, morphine injection, ondansetron **OR** ondansetron (ZOFRAN) IV  Time spent: 35 minutes  Author: Berle Mull, MD [facility] Pager: [phone] 06/18/2017 3:55 PM  If 7PM-7AM, please contact night-coverage at [email or web address], password [employer]

## 2017-06-18 NOTE — Progress Notes (Signed)
[facility] Progress Note  Patient: Melissa Myers WUJ:[id number]   PCP: [name], MD DOB: [birth year]   DOA: 06/16/2017   DOS: 06/18/2017   Date of Service: the patient was seen and examined on 06/18/2017  Subjective: Awake, back to baseline but still confused.  No nausea no vomiting.  Back pain is well controlled.  No diarrhea no constipation.  No burning urination.  Brief hospital course: Pt. with PMH of pancreatic cancer, HTN, diastolic CHF, chronic pain restless leg syndrome; admitted on 06/16/2017, presented with complaint of back pain, was found to have back spasm causing uncontrolled pain. Currently further plan is continue current pain control regimen.  Assessment and Plan: 1.  Intractable acute on chronic back pain. Initially in the past patient had pain radiating to the right side, this time the pain was radiating to the left side.  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 No evidence of severe neuropathy on examination. Patient was given IV morphine without any benefit.  IV Dilaudid was provided as well as home regimen for pain control for resumed.  Patient developed severe drowsiness. We will continue with Lyrica 75 mg twice daily, baclofen 5 mg twice daily.  Requip 2 mg 3 times daily.  Discontinue gabapentin. Change IV Dilaudid back to IV morphine. PT consult. CT femur left negative for any acute fracture.  2.  Acute toxic encephalopathy. Likely in the setting of medications. Primarily polypharmacy due to multiple psychotropic medications. Discontinue gabapentin, Remeron, reduce the dose of baclofen, Requip. Also reduce pain control. Monitor.  3.  Pancreatic cancer.  Stage Ib Recently taken off of chemotherapy due to low benefit. Possibly association with patient's current presentation with pain control. Monitor for now.  4.  Chronic diastolic CHF. Patient on torsemide at  home, Coreg, hydralazine and Aldactone. Also on 81 mg aspirin at home. Currently holding torsemide continue the rest of the medication.  5.  Restless leg syndrome. Patient is on 10 mg of Requip daily at home. Given that her severe encephalopathy will currently continue 6 mg daily.  Divided doses.  6.  Type 2 diabetes mellitus. Blood sugars are elevated. We will continue with sliding scale for today. Monitor.  Diet: Cardiac and carb modified diet, aspiration precaution. DVT Prophylaxis: subcutaneous Heparin  Advance goals of care discussion: full code  Family Communication: no family was present at bedside, at the time of interview.  Disposition:  Discharge to likely home tomorrow, pending improvement in pain as well as PT eval..  Consultants: none Procedures: Echocardiogram   Antibiotics: Anti-infectives (From admission, onward)   None       Objective: Physical Exam: Vitals:   06/17/17 1945 06/17/17 2047 06/18/17 0449 06/18/17 1257  BP: 128/61 (!) 130/59 138/67 133/68  Pulse: 64 (!) 59 65 60  Resp:  20 16 16   Temp: 98.5 F (36.9 C) 99.2 F (37.3 C) 98.1 F (36.7 C) 98.7 F (37.1 C)  TempSrc: Oral Oral Oral Oral  SpO2: 94% 94% 91% 95%  Weight:      Height:        Intake/Output Summary (Last 24 hours) at 06/18/2017 1537 Last data filed at 06/18/2017 0935 Gross per 24 hour  Intake -  Output 1300 ml  Net -1300 ml   Filed Weights   06/16/17 1621 06/17/17 0318  Weight: 92.1 kg (203 lb) 88.8 kg (195 lb 12.3 oz)   General: Alert, Awake and Oriented to Place  and Person. Appear in mild distress, affect appropriate eyes: PERRL, Conjunctiva normal ENT: Oral Mucosa clear moist. Neck: no JVD, no Abnormal Mass Or lumps Cardiovascular: S1 and S2 Present, no Murmur, Peripheral Pulses Present Respiratory: normal respiratory effort, Bilateral Air entry equal and Decreased, no use of accessory muscle, Clear to Auscultation, no Crackles, no wheezes Abdomen: Bowel Sound  pesent, Soft and no tenderness, no hernia Skin: no redness, no Rash, no induration Extremities: no Pedal edema, no calf tenderness Neurologic: Grossly no focal neuro deficit. Bilaterally Equal motor strength  Data Reviewed: CBC: Recent Labs  Lab 06/14/17 1012 06/16/17 1744 06/18/17 0831  WBC 6.1 10.3 7.5  NEUTROABS 3.9 7.3 4.7  HGB 10.4* 10.0* 10.3*  HCT 32.3* 32.5* 33.8*  MCV 83.0 85.8 86.0  PLT 285 268 423   Basic Metabolic Panel: Recent Labs  Lab 06/14/17 1012 06/16/17 1744 06/18/17 0831  NA 138 143 144  K 3.7 3.6 3.6  CL 99 103 104  CO2 25 27 27   GLUCOSE 307* 197* 156*  BUN 41* 35* 37*  CREATININE 1.29* 1.05* 1.03*  CALCIUM 9.9 8.7* 8.7*    Liver Function Tests: Recent Labs  Lab 06/14/17 1012 06/18/17 0831  AST 21 72*  ALT 16 36  ALKPHOS 285* 437*  BILITOT 0.3 0.7  PROT 7.7 6.5  ALBUMIN 3.1* 2.6*   No results for input(s): LIPASE, AMYLASE in the last 168 hours. No results for input(s): AMMONIA in the last 168 hours. Coagulation Profile: No results for input(s): INR, PROTIME in the last 168 hours. Cardiac Enzymes: No results for input(s): CKTOTAL, CKMB, CKMBINDEX, TROPONINI in the last 168 hours. BNP (last 3 results) No results for input(s): PROBNP in the last 8760 hours. CBG: Recent Labs  Lab 06/17/17 1143 06/17/17 1756 06/18/17 0735 06/18/17 1204 06/18/17 1445  GLUCAP 271* 233* 157* 244* 277*   Studies: Ct Femur Left Wo Contrast  Result Date: 06/17/2017 CLINICAL DATA:  Left leg pain.  History of pancreatic cancer. EXAM: CT OF THE LOWER LEFT EXTREMITY WITHOUT CONTRAST TECHNIQUE: Multidetector CT imaging of the lower left extremity was performed according to the standard protocol. COMPARISON:  Pelvic radiograph 06/17/2017 FINDINGS: Bones/Joint/Cartilage The left femoral head is normally located. There are moderate hip joint degenerative changes but no fracture or AVN. The visualized left hemipelvis is intact. The left femur is intact. No fracture  or bone lesion. There are advanced degenerative changes noted at the left knee joint. The surrounding hip and pelvic musculature appear grossly normal. No obvious muscle tear or hematoma. The thigh muscles appear normal. IMPRESSION: No acute bony findings. Moderate degenerative changes in the hip and advanced degenerative changes involving the knee. Electronically Signed   By: Marijo Sanes M.D.   On: 06/17/2017 17:12    Scheduled Meds: . aspirin EC  81 mg Oral Daily  . baclofen  5 mg Oral BID  . carvedilol  25 mg Oral BID WC  . enoxaparin (LOVENOX) injection  40 mg Subcutaneous Q24H  . insulin aspart  0-15 Units Subcutaneous TID WC  . insulin aspart  0-5 Units Subcutaneous QHS  . insulin glargine  20 Units Subcutaneous Daily  . lidocaine  1 patch Transdermal Q24H  . lipase/protease/amylase  36,000 Units Oral TID AC  . polyethylene glycol  17 g Oral Daily  . potassium chloride SA  20 mEq Oral Daily  . pregabalin  75 mg Oral Daily  . rOPINIRole  2 mg Oral BID   Continuous Infusions: PRN Meds: baclofen, HYDROcodone-acetaminophen, morphine injection,  ondansetron **OR** ondansetron (ZOFRAN) IV  Time spent: 35 minutes  Author: Berle Mull, MD [facility] Pager: [phone] 06/18/2017 3:37 PM  If 7PM-7AM, please contact night-coverage at [email or web address], password [facility]

## 2017-06-19 ENCOUNTER — Observation Stay (HOSPITAL_COMMUNITY): Payer: PPO

## 2017-06-19 ENCOUNTER — Encounter (HOSPITAL_COMMUNITY): Payer: Self-pay

## 2017-06-19 ENCOUNTER — Other Ambulatory Visit: Payer: Self-pay

## 2017-06-19 ENCOUNTER — Observation Stay (HOSPITAL_BASED_OUTPATIENT_CLINIC_OR_DEPARTMENT_OTHER): Payer: PPO

## 2017-06-19 DIAGNOSIS — M4807 Spinal stenosis, lumbosacral region: Secondary | ICD-10-CM | POA: Diagnosis present

## 2017-06-19 DIAGNOSIS — C250 Malignant neoplasm of head of pancreas: Secondary | ICD-10-CM | POA: Diagnosis present

## 2017-06-19 DIAGNOSIS — E1142 Type 2 diabetes mellitus with diabetic polyneuropathy: Secondary | ICD-10-CM | POA: Diagnosis present

## 2017-06-19 DIAGNOSIS — K219 Gastro-esophageal reflux disease without esophagitis: Secondary | ICD-10-CM | POA: Diagnosis not present

## 2017-06-19 DIAGNOSIS — I5032 Chronic diastolic (congestive) heart failure: Secondary | ICD-10-CM | POA: Diagnosis present

## 2017-06-19 DIAGNOSIS — T402X5A Adverse effect of other opioids, initial encounter: Secondary | ICD-10-CM | POA: Diagnosis not present

## 2017-06-19 DIAGNOSIS — N390 Urinary tract infection, site not specified: Secondary | ICD-10-CM | POA: Diagnosis present

## 2017-06-19 DIAGNOSIS — I130 Hypertensive heart and chronic kidney disease with heart failure and stage 1 through stage 4 chronic kidney disease, or unspecified chronic kidney disease: Secondary | ICD-10-CM | POA: Diagnosis present

## 2017-06-19 DIAGNOSIS — Z794 Long term (current) use of insulin: Secondary | ICD-10-CM | POA: Diagnosis not present

## 2017-06-19 DIAGNOSIS — C259 Malignant neoplasm of pancreas, unspecified: Secondary | ICD-10-CM | POA: Diagnosis not present

## 2017-06-19 DIAGNOSIS — M545 Low back pain: Secondary | ICD-10-CM | POA: Diagnosis present

## 2017-06-19 DIAGNOSIS — T428X5A Adverse effect of antiparkinsonism drugs and other central muscle-tone depressants, initial encounter: Secondary | ICD-10-CM | POA: Diagnosis not present

## 2017-06-19 DIAGNOSIS — I110 Hypertensive heart disease with heart failure: Secondary | ICD-10-CM | POA: Diagnosis not present

## 2017-06-19 DIAGNOSIS — Z6833 Body mass index (BMI) 33.0-33.9, adult: Secondary | ICD-10-CM | POA: Diagnosis not present

## 2017-06-19 DIAGNOSIS — Z9981 Dependence on supplemental oxygen: Secondary | ICD-10-CM | POA: Diagnosis not present

## 2017-06-19 DIAGNOSIS — G2581 Restless legs syndrome: Secondary | ICD-10-CM | POA: Diagnosis present

## 2017-06-19 DIAGNOSIS — E785 Hyperlipidemia, unspecified: Secondary | ICD-10-CM | POA: Diagnosis present

## 2017-06-19 DIAGNOSIS — G894 Chronic pain syndrome: Secondary | ICD-10-CM | POA: Diagnosis present

## 2017-06-19 DIAGNOSIS — D630 Anemia in neoplastic disease: Secondary | ICD-10-CM | POA: Diagnosis present

## 2017-06-19 DIAGNOSIS — E1129 Type 2 diabetes mellitus with other diabetic kidney complication: Secondary | ICD-10-CM | POA: Diagnosis not present

## 2017-06-19 DIAGNOSIS — M48061 Spinal stenosis, lumbar region without neurogenic claudication: Secondary | ICD-10-CM | POA: Diagnosis present

## 2017-06-19 DIAGNOSIS — M549 Dorsalgia, unspecified: Secondary | ICD-10-CM | POA: Diagnosis present

## 2017-06-19 DIAGNOSIS — E1122 Type 2 diabetes mellitus with diabetic chronic kidney disease: Secondary | ICD-10-CM | POA: Diagnosis present

## 2017-06-19 DIAGNOSIS — R509 Fever, unspecified: Secondary | ICD-10-CM | POA: Diagnosis not present

## 2017-06-19 DIAGNOSIS — N189 Chronic kidney disease, unspecified: Secondary | ICD-10-CM | POA: Diagnosis present

## 2017-06-19 DIAGNOSIS — G4733 Obstructive sleep apnea (adult) (pediatric): Secondary | ICD-10-CM | POA: Diagnosis present

## 2017-06-19 DIAGNOSIS — I872 Venous insufficiency (chronic) (peripheral): Secondary | ICD-10-CM | POA: Diagnosis not present

## 2017-06-19 DIAGNOSIS — E1165 Type 2 diabetes mellitus with hyperglycemia: Secondary | ICD-10-CM | POA: Diagnosis present

## 2017-06-19 DIAGNOSIS — D638 Anemia in other chronic diseases classified elsewhere: Secondary | ICD-10-CM | POA: Diagnosis not present

## 2017-06-19 DIAGNOSIS — E669 Obesity, unspecified: Secondary | ICD-10-CM | POA: Diagnosis present

## 2017-06-19 DIAGNOSIS — E1140 Type 2 diabetes mellitus with diabetic neuropathy, unspecified: Secondary | ICD-10-CM | POA: Diagnosis not present

## 2017-06-19 DIAGNOSIS — R011 Cardiac murmur, unspecified: Secondary | ICD-10-CM | POA: Diagnosis present

## 2017-06-19 DIAGNOSIS — G92 Toxic encephalopathy: Secondary | ICD-10-CM | POA: Diagnosis not present

## 2017-06-19 DIAGNOSIS — R52 Pain, unspecified: Secondary | ICD-10-CM | POA: Diagnosis not present

## 2017-06-19 DIAGNOSIS — F329 Major depressive disorder, single episode, unspecified: Secondary | ICD-10-CM | POA: Diagnosis present

## 2017-06-19 LAB — GLUCOSE, CAPILLARY
GLUCOSE-CAPILLARY: 177 mg/dL — AB (ref 65–99)
GLUCOSE-CAPILLARY: 133 mg/dL — AB (ref 65–99)
Glucose-Capillary: 162 mg/dL — ABNORMAL HIGH (ref 65–99)
Glucose-Capillary: 123 mg/dL — ABNORMAL HIGH (ref 65–99)

## 2017-06-19 LAB — URINALYSIS, ROUTINE W REFLEX MICROSCOPIC
Bilirubin Urine: NEGATIVE
Glucose, UA: NEGATIVE mg/dL
Ketones, ur: NEGATIVE mg/dL
Nitrite: NEGATIVE
Protein, ur: NEGATIVE mg/dL
Specific Gravity, Urine: 1.014 (ref 1.005–1.030)
WBC, UA: >50 WBC/hpf — ABNORMAL HIGH (ref 0–5)
pH: 5 (ref 5.0–8.0)

## 2017-06-19 MED ORDER — ACETAMINOPHEN 325 MG PO TABS
650.0000 mg | ORAL_TABLET | Freq: Four times a day (QID) | ORAL | Status: DC | PRN
  Administered 2017-06-19: 650 mg via ORAL
  Filled 2017-06-19: qty 2

## 2017-06-19 MED ORDER — SODIUM CHLORIDE 0.9 % IV SOLN
1.0000 g | INTRAVENOUS | Status: DC
  Administered 2017-06-19: 1 g via INTRAVENOUS
  Filled 2017-06-19: qty 1
  Filled 2017-06-19: qty 10

## 2017-06-19 NOTE — Progress Notes (Signed)
  Echocardiogram 2D Echocardiogram has been performed.  [name] F 06/19/2017, 10:13 AM

## 2017-06-19 NOTE — Progress Notes (Signed)
Melissa Myers as a temperature of 101.4 this am. [employer] notified, Ecourage this patient to CTDB. Voiced understanding but no returned effort noted.

## 2017-06-19 NOTE — Progress Notes (Signed)
Pharmacy Antibiotic Note  Melissa Myers is a 72 y.o. female admitted on 06/16/2017 with PMH of pancreatic cancer,HTN,diastolic CHF,chronic pain, restless leg syndrome, presented with complaint of back pain.[name]  Pharmacy has been consulted for ceftriaxone dosing for UTI.  Plan: Ceftriaxone 1gm IV q24h Pharmacy will sign off as no adjustment is needed in renal dysfunction.  Height: 5' (152.4 cm) Weight: 195 lb 12.3 oz (88.8 kg) IBW/kg (Calculated) : 45.5  Temp (24hrs), Avg:100.1 F (37.8 C), Min:98.5 F (36.9 C), Max:101.4 F (38.6 C)  Recent Labs  Lab 06/14/17 1012 06/16/17 1744 06/18/17 0831  WBC 6.1 10.3 7.5  CREATININE 1.29* 1.05* 1.03*    Estimated Creatinine Clearance: 49.7 mL/min (A) (by C-G formula based on SCr of 1.03 mg/dL (H)).    Allergies  Allergen Reactions  . Morphine Other (See Comments) and Nausea Only    GI upset and headaches GI upset and headaches  . Statins Other (See Comments)    Statin drugs cause muscle pain / "muscle damage"--was told by MD not to take rhabdomyolysis Statin drugs cause muscle pain / "muscle damage"--was told by MD not to take  . Sulfa Antibiotics Diarrhea and Nausea Only  . Cymbalta [Duloxetine Hcl] Other (See Comments)    Restless leg syndrome Restless leg syndrome, rash, itching, and diarrhea  . Sulfasalazine Diarrhea  . Levemir [Insulin Detemir] Itching  . Zinc Swelling, Rash, Hives and Other (See Comments)     Thank you for allowing pharmacy to be a part of this patient's care.  Dolly Rias RPh 06/19/2017, 4:50 PM Pager [phone]

## 2017-06-19 NOTE — Clinical Social Work Note (Signed)
Clinical Social Work Assessment  Patient Details  Name: Melissa Myers MRN: [id number] Date of Birth: Jan 18, 1946  Date of referral:  06/19/17               Reason for consult:  (no formal consult)                Permission sought to share information with:  Family Supports Permission granted to share information::  Yes, Verbal Permission Granted  Name::     son Therapist, sports::     Relationship::     Contact Information:     Housing/Transportation Living arrangements for the past 2 months:  [place] of Information:  Patient, Adult Children Patient Interpreter Needed:  None Criminal Activity/Legal Involvement Pertinent to Current Situation/Hospitalization:  No - Comment as needed Significant Relationships:  Warehouse manager, Other Family Members, Spouse, Adult Children Lives with:  Self, Significant Other Do you feel safe going back to the place where you live?  Yes Need for family participation in patient care:  Yes (Comment)(family assists)  Care giving concerns:  Pt admitted from home where she resides with her spouse and has employed 24/7 caregivers. At baseline caregivers assist her with transfers and she can sometimes walk with her walker. Admitted with encephalopathy and intractable pain. Has pancreatic cancer, post chemotherapy.   Social Worker assessment / plan:  CSW met with pt and son at bedside to discuss disposition.  Explained per therapy recommendation, SNF appropriate. Son and pt explain that "this is pretty close to how she is at home." Decline SNF, state that pt is hoping to try to work with PT again prior to DC in order to confirm her mobility needs.  Plan: Pt hoping to return directly home at DC- has 24/7 caregivers and support of family.   Employment status:  Retired Forensic scientist:  Managed Medicare([employer]) PT Recommendations:  [place] / Referral to community resources:      Patient/Family's Response to care:  appreciative  Patient/Family's Understanding of and Emotional Response to Diagnosis, Current Treatment, and Prognosis: Pt shows adequate understanding of plan- did not discuss treatment at length. Emotionally calm and adaptable. Son demonstrated understanding of treatment and plan and was good describer of pt's care needs. Pt states, "I don't feel very debilitated anymore. I feel mostly like I do at home."  Emotional Assessment Appearance:  Appears stated age Attitude/Demeanor/Rapport:  Engaged Affect (typically observed):  Accepting, Adaptable Orientation:  Oriented to Self, Oriented to Place, Oriented to Situation, Oriented to  Time Alcohol / Substance use:  Not Applicable Psych involvement (Current and /or in the community):  No (Comment)  Discharge Needs  Concerns to be addressed:  Decision making concerns Readmission within the last 30 days:  No Current discharge risk:  None Barriers to Discharge:  Continued Medical Work up   [employer], LCSW 06/19/2017, 10:01 AM  [phone]

## 2017-06-19 NOTE — Progress Notes (Signed)
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  Has PMHx: pancreatic cancer, euvolemia with CHF, HTN, CHF, RLS, spinal osteophytes.     PT Comments    Pt agreeable to attempt mobility as just premedicated with IV pain meds however very lethargic and continues to report pain and moan with assistance of extremities.  Pt attempted rolling however unable to tolerate.  RN notified.  Continue to recommend SNF upon d/c.    Follow Up Recommendations  SNF     Equipment Recommendations  Wheelchair cushion (measurements PT);Wheelchair (measurements PT)    Recommendations for Other Services       Precautions / Restrictions Precautions Precautions: Back;Fall Precaution Comments: pt now on oxygen    Mobility  Bed Mobility Overal bed mobility: Needs Assistance Bed Mobility: Rolling Rolling: Total assist         General bed mobility comments: attempted to assist pt with positioning for log roll technique multiple times, pt moaning in pain when assistance provided, pt initiating movement however would then stop requiring total assist, deferred further mobility due to pain and safety; repositioned in bed  Transfers                    Ambulation/Gait                 Stairs             Wheelchair Mobility    Modified Rankin (Stroke Patients Only)       Balance                                            Cognition Arousal/Alertness: Lethargic;Suspect due to medications   Overall Cognitive Status: Difficult to assess                                 General Comments: very groggy, able to open eyes with cueing however mostly moaning  with pain, agreeable to attempt to mobilize however not assisting      Exercises      General Comments        Pertinent Vitals/Pain Pain Assessment: 0-10 Pain Score: 10-Worst pain ever Pain Location: back Pain Descriptors / Indicators: Grimacing;Moaning Pain Intervention(s): Monitored during session;Premedicated before session;Repositioned(pt just received IV pain meds prior to session)    Home Living Family/patient expects to be discharged to:: Private residence Living Arrangements: Spouse/significant other                  Prior Function            PT Goals (current goals can now be found in the care plan section) Progress towards PT goals: Not progressing toward goals - comment(pain and lethargy limiting)    Frequency    Min 2X/week      PT Plan Current plan remains appropriate    Co-evaluation              AM-PAC PT "6 Clicks" Daily Activity  Outcome Measure  Difficulty turning over in bed (including adjusting bedclothes, sheets  and blankets)?: Unable Difficulty moving from lying on back to sitting on the side of the bed? : Unable Difficulty sitting down on and standing up from a chair with arms (e.g., wheelchair, bedside commode, etc,.)?: Unable Help needed moving to and from a bed to chair (including a wheelchair)?: Total Help needed walking in hospital room?: Total Help needed climbing 3-5 steps with a railing? : Total 6 Click Score: 6    End of Session   Activity Tolerance: Patient limited by pain Patient left: in bed;with bed alarm set;with call bell/phone within reach Nurse Communication: Mobility status PT Visit Diagnosis: Unsteadiness on feet (R26.81);Muscle weakness (generalized) (M62.81);Pain Pain - part of body: (back)     Time: 1224-8250 PT Time Calculation (min) (ACUTE ONLY): 9 min  Charges:  $Therapeutic Activity: 8-22 mins                    G Codes:      Carmelia Bake, PT, DPT 06/19/2017 Pager:  [phone]  York Ram E 06/19/2017, 12:59 PM

## 2017-06-19 NOTE — Progress Notes (Signed)
[facility] Progress Note  Patient: Melissa Myers:[id number]   PCP: Melissa Koch, MD DOB: 1945/10/06   DOA: 06/16/2017   DOS: 06/19/2017   Date of Service: the patient was seen and examined on 06/19/2017  Subjective: No nausea no vomiting.  Had fever this morning.  No diarrhea reported as well.  Brief hospital course: Pt. with PMH of pancreatic cancer, HTN, diastolic CHF, chronic pain restless leg syndrome; admitted on 06/16/2017, presented with complaint of back pain, was found to have back spasm causing uncontrolled pain. Currently further plan is continue current pain control regimen.  Assessment and Plan: 1.  Intractable acute on chronic back pain. Initially in the past patient had pain radiating to the right side, this time the pain was radiating to the left side.  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 No evidence of severe neuropathy on examination. Patient was given IV morphine without any benefit.  IV Dilaudid was provided as well as home regimen for pain control for resumed.  Patient developed severe drowsiness. We will continue with Lyrica 75 mg twice daily, baclofen 5 mg twice daily. Requip 2 mg 3 times daily.  Discontinue gabapentin. Change IV Dilaudid back to IV morphine. PT consult. CT femur left negative for any acute fracture.  2.  Acute toxic encephalopathy. UTI Likely in the setting of medications. Primarily polypharmacy due to multiple psychotropic medications. Discontinue gabapentin, Remeron, reduce the dose of baclofen, Requip. Also reduce pain control. Started on IV ceftriaxone, follow-up on urine culture, monitor.  3.  Pancreatic cancer.  Stage Ib Recently taken off of chemotherapy due to low benefit. Possibly association with patient's current presentation with pain control. Monitor for now.  4.  Chronic diastolic CHF. Patient on torsemide at home, Coreg,  hydralazine and Aldactone. Also on 81 mg aspirin at home. Currently holding torsemide continue the rest of the medication.  5.  Restless leg syndrome. Patient is on 10 mg of Requip daily at home. Given that her severe encephalopathy will currently continue 6 mg daily.  Divided doses.  6.  Type 2 diabetes mellitus. Blood sugars are elevated. We will continue with sliding scale for today. Monitor.  7.?  PFO. Cardiac murmur. On presentation patient was identified to have a cardiac murmur, echocardiogram was performed. 65 to 70% EF, grade 2 diastolic dysfunction with concentric hypertrophy. Mild MS, mild AS. A PFO cannot be excluded based on the information available and therefore bubble study is currently recommended. Bubble study does not show any evidence of PFO for now.  Diet: Cardiac and carb modified diet, aspiration precaution. DVT Prophylaxis: subcutaneous Heparin  Advance goals of care discussion: full code  Family Communication: no family was present at bedside, at the time of interview.  Disposition:  Discharge to SNF  Consultants: none Procedures: Echocardiogram   Antibiotics: Anti-infectives (From admission, onward)   Start     Dose/Rate Route Frequency Ordered Stop   06/19/17 1800  cefTRIAXone (ROCEPHIN) 1 g in sodium chloride 0.9 % 100 mL IVPB     1 g 200 mL/hr over 30 Minutes Intravenous Every 24 hours 06/19/17 1647         Objective: Physical Exam: Vitals:   06/18/17 2155 06/19/17 0550 06/19/17 1403 06/19/17 1605  BP: (!) 160/64 (!) 148/57 (!) 118/51   Pulse: 63 79 65   Resp: 20 20 17    Temp: 98.5 F (36.9 C) (!) 101.4 F (38.6 C) (!)  101.1 F (38.4 C) 99.4 F (37.4 C)  TempSrc: Oral Oral Oral Oral  SpO2: 96% (!) 86% 93%   Weight:      Height:        Intake/Output Summary (Last 24 hours) at 06/19/2017 1817 Last data filed at 06/19/2017 1121 Gross per 24 hour  Intake 590 ml  Output 700 ml  Net -110 ml   Filed Weights   06/16/17 1621  06/17/17 0318  Weight: 92.1 kg (203 lb) 88.8 kg (195 lb 12.3 oz)   General: Alert, Awake and Oriented to Place and Person. Appear in mild distress, affect appropriate eyes: PERRL, Conjunctiva normal ENT: Oral Mucosa clear moist. Neck: no JVD, no Abnormal Mass Or lumps Cardiovascular: S1 and S2 Present, aortic systolic Murmur, Peripheral Pulses Present Respiratory: normal respiratory effort, Bilateral Air entry equal and Decreased, no use of accessory muscle, Clear to Auscultation, no Crackles, no wheezes Abdomen: Bowel Sound pesent, Soft and no tenderness, no hernia Skin: no redness, no Rash, no induration Extremities: no Pedal edema, no calf tenderness Neurologic: Grossly no focal neuro deficit. Bilaterally Equal motor strength  Data Reviewed: CBC: Recent Labs  Lab 06/14/17 1012 06/16/17 1744 06/18/17 0831  WBC 6.1 10.3 7.5  NEUTROABS 3.9 7.3 4.7  HGB 10.4* 10.0* 10.3*  HCT 32.3* 32.5* 33.8*  MCV 83.0 85.8 86.0  PLT 285 268 809   Basic Metabolic Panel: Recent Labs  Lab 06/14/17 1012 06/16/17 1744 06/18/17 0831  NA 138 143 144  K 3.7 3.6 3.6  CL 99 103 104  CO2 25 27 27   GLUCOSE 307* 197* 156*  BUN 41* 35* 37*  CREATININE 1.29* 1.05* 1.03*  CALCIUM 9.9 8.7* 8.7*    Liver Function Tests: Recent Labs  Lab 06/14/17 1012 06/18/17 0831  AST 21 72*  ALT 16 36  ALKPHOS 285* 437*  BILITOT 0.3 0.7  PROT 7.7 6.5  ALBUMIN 3.1* 2.6*   No results for input(s): LIPASE, AMYLASE in the last 168 hours. No results for input(s): AMMONIA in the last 168 hours. Coagulation Profile: No results for input(s): INR, PROTIME in the last 168 hours. Cardiac Enzymes: No results for input(s): CKTOTAL, CKMB, CKMBINDEX, TROPONINI in the last 168 hours. BNP (last 3 results) No results for input(s): PROBNP in the last 8760 hours. CBG: Recent Labs  Lab 06/18/17 1747 06/18/17 2210 06/19/17 0747 06/19/17 1158 06/19/17 1719  GLUCAP 299* 275* 177* 162* 133*   Studies: Dg Chest Port  1 View  Result Date: 06/19/2017 CLINICAL DATA:  Fever. EXAM: PORTABLE CHEST 1 VIEW COMPARISON:  02/26/2017 FINDINGS: Heart size and pulmonary vascularity are normal and the lungs are clear. No infiltrates or effusions. No bone abnormality. Port-A-Cath with the tip in the superior vena cava just below the level of the carina. IMPRESSION: No active disease. Electronically Signed   By: Lorriane Shire M.D.   On: 06/19/2017 08:59    Scheduled Meds: . aspirin EC  81 mg Oral Daily  . baclofen  5 mg Oral BID  . carvedilol  25 mg Oral BID WC  . enoxaparin (LOVENOX) injection  40 mg Subcutaneous Q24H  . insulin aspart  0-15 Units Subcutaneous TID WC  . insulin aspart  0-5 Units Subcutaneous QHS  . insulin glargine  20 Units Subcutaneous Daily  . lidocaine  1 patch Transdermal Q24H  . lipase/protease/amylase  36,000 Units Oral TID AC  . polyethylene glycol  17 g Oral Daily  . potassium chloride SA  20 mEq Oral Daily  . pregabalin  75 mg Oral Daily  . rOPINIRole  2 mg Oral BID  . spironolactone  25 mg Oral Daily  . torsemide  10 mg Oral Daily   Continuous Infusions: . cefTRIAXone (ROCEPHIN)  IV 1 g (06/19/17 1736)   PRN Meds: acetaminophen, baclofen, HYDROcodone-acetaminophen, morphine injection, ondansetron **OR** ondansetron (ZOFRAN) IV  Time spent: 35 minutes  Author: Berle Mull, MD [facility] Pager: [phone] 06/19/2017 6:17 PM  If 7PM-7AM, please contact night-coverage at [email or web address], password [facility]

## 2017-06-20 DIAGNOSIS — G2581 Restless legs syndrome: Secondary | ICD-10-CM

## 2017-06-20 DIAGNOSIS — K219 Gastro-esophageal reflux disease without esophagitis: Secondary | ICD-10-CM | POA: Diagnosis not present

## 2017-06-20 DIAGNOSIS — I872 Venous insufficiency (chronic) (peripheral): Secondary | ICD-10-CM

## 2017-06-20 DIAGNOSIS — C259 Malignant neoplasm of pancreas, unspecified: Secondary | ICD-10-CM | POA: Diagnosis not present

## 2017-06-20 DIAGNOSIS — Z794 Long term (current) use of insulin: Secondary | ICD-10-CM | POA: Diagnosis not present

## 2017-06-20 DIAGNOSIS — E1129 Type 2 diabetes mellitus with other diabetic kidney complication: Secondary | ICD-10-CM | POA: Diagnosis not present

## 2017-06-20 DIAGNOSIS — E1140 Type 2 diabetes mellitus with diabetic neuropathy, unspecified: Secondary | ICD-10-CM

## 2017-06-20 DIAGNOSIS — M545 Low back pain: Secondary | ICD-10-CM | POA: Diagnosis not present

## 2017-06-20 DIAGNOSIS — D638 Anemia in other chronic diseases classified elsewhere: Secondary | ICD-10-CM

## 2017-06-20 DIAGNOSIS — I110 Hypertensive heart disease with heart failure: Secondary | ICD-10-CM | POA: Diagnosis not present

## 2017-06-20 DIAGNOSIS — I5032 Chronic diastolic (congestive) heart failure: Secondary | ICD-10-CM | POA: Diagnosis not present

## 2017-06-20 LAB — CBC WITH DIFFERENTIAL/PLATELET
Basophils Absolute: 0 10*3/uL (ref 0.0–0.1)
Basophils Relative: 0 %
Eosinophils Absolute: 0.1 10*3/uL (ref 0.0–0.7)
Eosinophils Relative: 2 %
HCT: 31.8 % — ABNORMAL LOW (ref 36.0–46.0)
HEMOGLOBIN: 9.6 g/dL — AB (ref 12.0–15.0)
LYMPHS ABS: 2.1 10*3/uL (ref 0.7–4.0)
Lymphocytes Relative: 25 %
MCH: 26.2 pg (ref 26.0–34.0)
MCHC: 30.2 g/dL (ref 30.0–36.0)
MCV: 86.9 fL (ref 78.0–100.0)
MONOS PCT: 11 %
Monocytes Absolute: 0.9 10*3/uL (ref 0.1–1.0)
NEUTROS ABS: 5.3 10*3/uL (ref 1.7–7.7)
NEUTROS PCT: 62 %
Platelets: 256 10*3/uL (ref 150–400)
RBC: 3.66 MIL/uL — ABNORMAL LOW (ref 3.87–5.11)
RDW: 16.4 % — ABNORMAL HIGH (ref 11.5–15.5)
WBC: 8.5 10*3/uL (ref 4.0–10.5)

## 2017-06-20 LAB — GLUCOSE, CAPILLARY
GLUCOSE-CAPILLARY: 133 mg/dL — AB (ref 65–99)
GLUCOSE-CAPILLARY: 286 mg/dL — AB (ref 65–99)
Glucose-Capillary: 297 mg/dL — ABNORMAL HIGH (ref 65–99)

## 2017-06-20 LAB — MAGNESIUM: Magnesium: 1.9 mg/dL (ref 1.7–2.4)

## 2017-06-20 LAB — BASIC METABOLIC PANEL
Anion gap: 11 (ref 5–15)
BUN: 46 mg/dL — AB (ref 6–20)
CHLORIDE: 103 mmol/L (ref 101–111)
CO2: 26 mmol/L (ref 22–32)
CREATININE: 1.34 mg/dL — AB (ref 0.44–1.00)
Calcium: 8.7 mg/dL — ABNORMAL LOW (ref 8.9–10.3)
GFR calc non Af Amer: 39 mL/min — ABNORMAL LOW (ref >60)
GFR, EST AFRICAN AMERICAN: 45 mL/min — AB (ref >60)
Glucose, Bld: 132 mg/dL — ABNORMAL HIGH (ref 65–99)
Potassium: 4.2 mmol/L (ref 3.5–5.1)
Sodium: 140 mmol/L (ref 135–145)

## 2017-06-20 MED ORDER — HYDROCODONE-ACETAMINOPHEN 5-325 MG PO TABS: 1.0000 | ORAL_TABLET | Freq: Four times a day (QID) | ORAL | 0 refills | Status: DC | PRN

## 2017-06-20 MED ORDER — CEPHALEXIN 500 MG PO CAPS: 500.0000 mg | ORAL_CAPSULE | Freq: Two times a day (BID) | ORAL | 0 refills | Status: AC

## 2017-06-20 MED ORDER — DOCUSATE SODIUM 100 MG PO CAPS: 100.0000 mg | ORAL_CAPSULE | Freq: Two times a day (BID) | ORAL | 2 refills | Status: DC

## 2017-06-20 MED ORDER — ROPINIROLE HCL 2 MG PO TABS: 2.0000 mg | ORAL_TABLET | Freq: Two times a day (BID) | ORAL | 0 refills | Status: AC

## 2017-06-20 MED ORDER — POLYETHYLENE GLYCOL 3350 17 G PO PACK: 17.0000 g | PACK | Freq: Every day | ORAL | 0 refills | Status: DC

## 2017-06-20 MED ORDER — MIRTAZAPINE 15 MG PO TABS: 15.0000 mg | ORAL_TABLET | Freq: Every day | ORAL | 0 refills | Status: AC

## 2017-06-20 NOTE — Progress Notes (Signed)
Pt reported to medical team interest in rehab. CSW met with pt and husband again at bedside to discuss disposition plan. They explain that they have 3 private duty caregivers and an RN through [facility] [place] and are interested in having PT at home as well if insurance would cover. CM following as well, pt had previously declined any additional home services. Pt reports she plans to return home with husband at DC today.  Sharren Bridge, MSW, LCSW Clinical Social Work 06/20/2017 [phone]

## 2017-06-20 NOTE — Care Management Note (Signed)
Case Management Note  Patient Details  Name: [name] MRN: [id number] Date of Birth: 1945-11-16  Per CSW, pt would like to DC back home and has 24 hr caregivers. This CM confirmed this with pt at bedside. Pt also declines any additional home health services at this time.     Lynnell Catalan, RN 06/20/2017, 9:45 AM [phone]

## 2017-06-20 NOTE — Progress Notes (Addendum)
Per CSW, pt now wants home health services. Choice offered to husband who chooses [facility]. Referral called to [facility] rep. Home health orders received. Marney Doctor RN,BSN,NCM [phone]

## 2017-06-20 NOTE — Progress Notes (Signed)
[facility]: Pt is a current pt with [facility] a home based Palliative Care program provided by [facility]. Pt lives in home with spouse. Uses oxygen and owns a walker. Plan of care and Face sheet placed on shadow chart for scanning and review if needed. Plan to continue to follow when discharged. Please notify office if you have questions-- [phone].  Hospital Liaison  Webb Silversmith RN

## 2017-06-20 NOTE — Consult Note (Addendum)
   [facility] CM Inpatient Consult   06/20/2017  Melissa Myers 06-16-1945 [id number]   Patient screened for potential [facility] Care Management program due to high risk of unplanned readmission score.   Spoke with Melissa Myers at bedside. She pleasantly declines [facility] Care Management program. States she has 24/7 caregiver assistance at home. Denies any concerns with medication, transportation, disease management, or community resources.   Provided [facility] Care Management brochure with contact information and 24-hr nurse advice line magnet. Appreciative of visit.  Will make inpatient RNCM aware Melissa Myers declined [facility] Management services.   Addendum: Spoke with Cheri with [facility] (outpatient home based palliative care program administered by [facility]). Cheri states Melissa Myers is active with [facility] program. Notification sent to inpatient RNCM to update.    Marthenia Rolling, MSN-Ed, RN,BSN [facility] Liaison [phone]

## 2017-06-21 NOTE — Discharge Summary (Signed)
[facility] Discharge Summary   Patient: Melissa Myers WGN:[id number]   PCP: [name], MD DOB: 08-24-1945   Date of admission: 06/16/2017   Date of discharge: 06/20/2017     Discharge Diagnoses:  Active Problems:   RESTLESS LEGS SYNDROME   Essential hypertension   Chronic diastolic heart failure (HCC)   IDDM (insulin dependent diabetes mellitus) (HCC)   Chronic pain syndrome   MDD (major depressive disorder), single episode, severe (HCC)   Malignant neoplasm of head of pancreas (HCC)   Intractable back pain   Back pain   Admitted From: home Disposition:  Home with home health, family refused SNF  Recommendations for Outpatient Follow-up:  1. Please follow up with PCP in 1 week   Follow-up Information    [name], MD. Schedule an appointment as soon as possible for a visit in 1 week(s).   Specialty:  Internal Medicine Contact information: [place] [address] [phone]        [facility] Follow up.   Specialty:  Home Health Services Why:  For home health services Contact information: [place] [address] [phone]          Diet recommendation: cardiac diet carb modifed  Activity: The patient is advised to gradually reintroduce usual activities.  Discharge Condition: good  Code Status: full code  History of present illness: As per the H and P dictated on admission, "Melissa Myers is a 72 y.o. female with medical history significant pancreatic cancer, HTN, diastolic CHF, chronic pain restless leg syndrome presented to the ED with complaints of worsening of her chronic back pain that started today.  At baseline patient takes oxycodone 1 to 2 tablets in a week for chronic back pain.  Patient denies trauma or falls.  She denies fever or chills.  Denies weakness of any of her extremities.,  Denies urinary or bowel problems.  Denies numbness of her extremities  or numbness to suggest saddle anesthesia.  She denies radiation of pain down her lower extremities. Patient's states pain is so bad that she cannot walk, or even sit up in bed."  Hospital Course:  Summary of her active problems in the hospital is as following. 1.  Intractable acute on chronic back pain. Initially in the past patient had pain radiating to the right side, this time the pain was radiating to the left side.  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 No evidence of severe neuropathy on examination. Patient was given IV morphine without any benefit.  IV Dilaudid was provided as well as home regimen for pain control for resumed.  Patient developed severe drowsiness. We will continue with Lyrica 75 mg twice daily, baclofen 5 mg twice daily. Requip 2 mg 3 times daily.  Discontinue gabapentin. PT consult recommended SNF, family and pt refused. CT femur left negative for any acute fracture.  2.  Acute toxic encephalopathy. UTI Likely in the setting of medications. Primarily polypharmacy due to multiple psychotropic medications. Discontinue gabapentin, Remeron, reduce the dose of baclofen, Requip. Also reduce pain control. Started on IV ceftriaxone, discharge on keflex..  3.  Pancreatic cancer.  Stage Ib Recently taken off of chemotherapy due to low benefit. Possibly association with patient's current presentation with pain control. Monitor for now.  4.  Chronic diastolic CHF. Patient on torsemide at home, Coreg, hydralazine and Aldactone. Also on 81 mg aspirin  at home. Continue the rest of the medication.  5.  Restless leg syndrome. Patient is on 10 mg of Requip daily at home. Given that her severe encephalopathy will currently continue 6 mg daily.  Divided doses.  6.  Type 2 diabetes mellitus. Blood sugars are elevated. Continue home regimen  7.?  PFO. Cardiac murmur. On presentation  patient was identified to have a cardiac murmur, echocardiogram was performed. 65 to 70% EF, grade 2 diastolic dysfunction with concentric hypertrophy. Mild MS, mild AS. A PFO cannot be excluded based on the information available and therefore bubble study is currently recommended. Bubble study does not show any evidence of PFO for now.  All other chronic medical condition were stable during the hospitalization.  Patient was seen by physical therapy, who recommended SNF, they refused so home health was arranged by Education officer, museum and case Freight forwarder. On the day of the discharge the patient's vitals were stable, and no other acute medical condition were reported by patient. the patient was felt safe to be discharge at home with home health.  Consultants: none Procedures: Echocardiogram   DISCHARGE MEDICATION: Allergies as of 06/20/2017      Reactions   Morphine Other (See Comments), Nausea Only   GI upset and headaches GI upset and headaches   Statins Other (See Comments)   Statin drugs cause muscle pain / "muscle damage"--was told by MD not to take rhabdomyolysis Statin drugs cause muscle pain / "muscle damage"--was told by MD not to take   Sulfa Antibiotics Diarrhea, Nausea Only   Cymbalta [duloxetine Hcl] Other (See Comments)   Restless leg syndrome Restless leg syndrome, rash, itching, and diarrhea   Sulfasalazine Diarrhea   Levemir [insulin Detemir] Itching   Zinc Swelling, Rash, Hives, Other (See Comments)      Medication List    STOP taking these medications   diphenoxylate-atropine 2.5-0.025 MG tablet Commonly known as:  LOMOTIL   gabapentin 300 MG capsule Commonly known as:  NEURONTIN   hydrALAZINE 25 MG tablet Commonly known as:  APRESOLINE   meclizine 25 MG tablet Commonly known as:  ANTIVERT   oxyCODONE 5 MG immediate release tablet Commonly known as:  Oxy IR/ROXICODONE   torsemide 100 MG tablet Commonly known as:  DEMADEX     TAKE these medications     aspirin EC 81 MG tablet Take 1 tablet (81 mg total) by mouth daily.   baclofen 10 MG tablet Commonly known as:  LIORESAL Take 0.5 tablets (5 mg total) by mouth 3 (three) times daily. What changed:  how much to take   carvedilol 25 MG tablet Commonly known as:  COREG TAKE ONE TABLET TWICE DAILY WITH FOOD   cephALEXin 500 MG capsule Commonly known as:  KEFLEX Take 1 capsule (500 mg total) by mouth 2 (two) times daily for 4 days.   docusate sodium 100 MG capsule Commonly known as:  COLACE Take 1 capsule (100 mg total) by mouth 2 (two) times daily.   FREESTYLE LIBRE SENSOR SYSTEM Misc Use to check sugars   HYDROcodone-acetaminophen 5-325 MG tablet Commonly known as:  NORCO/VICODIN Take 1 tablet by mouth every 6 (six) hours as needed for moderate pain or severe pain.   insulin aspart 100 UNIT/ML injection Commonly known as:  novoLOG Inject 0-20 Units into the skin 3 (three) times daily with meals. . CBG 70 - 120: 0 units CBG 121 - 150: 3 units CBG 151 - 200: 4 units CBG 201 - 250: 7 units CBG 251 -  300: 11 units CBG 301 - 350: 15 units CBG 351 - 400: 20 units   insulin glargine 100 UNIT/ML injection Commonly known as:  LANTUS Inject 0.3 mLs (30 Units total) into the skin daily.   lidocaine 5 % Commonly known as:  LIDODERM Place 1 patch daily onto the skin. Remove & Discard patch within 12 hours or as directed by MD   lidocaine-prilocaine cream Commonly known as:  EMLA Apply to port site prior to being accessed   lipase/protease/amylase 36000 UNITS Cpep capsule Commonly known as:  CREON 2 po prior to snacks; 4 po prior to meals   LYRICA 75 MG capsule Generic drug:  pregabalin TAKE ONE CAPSULE EACH DAY   metFORMIN 500 MG tablet Commonly known as:  GLUCOPHAGE Take 500 mg by mouth daily with breakfast.   mirtazapine 15 MG tablet Commonly known as:  REMERON Take 1 tablet (15 mg total) by mouth at bedtime. What changed:    medication strength  how much to  take   mupirocin ointment 2 % Commonly known as:  BACTROBAN Apply 1 application topically daily.   ondansetron 8 MG tablet Commonly known as:  ZOFRAN Take 1 tablet (8 mg total) by mouth every 8 (eight) hours as needed for nausea or vomiting.   ONETOUCH VERIO test strip Generic drug:  glucose blood USE AS INSTRUCTED UP TO 4 TIMES DAILY   polyethylene glycol packet Commonly known as:  MIRALAX / GLYCOLAX Take 17 g by mouth daily.   potassium chloride SA 20 MEQ tablet Commonly known as:  K-DUR,KLOR-CON Take 20 mEq by mouth daily.   rOPINIRole 2 MG tablet Commonly known as:  REQUIP Take 1 tablet (2 mg total) by mouth 2 (two) times daily. What changed:    when to take this  Another medication with the same name was removed. Continue taking this medication, and follow the directions you see here.   sertraline 50 MG tablet Commonly known as:  ZOLOFT Take 50 mg by mouth daily.   silver sulfADIAZINE 1 % cream Commonly known as:  SILVADENE Apply 1 application daily topically.   spironolactone 25 MG tablet Commonly known as:  ALDACTONE Take 1 tablet (25 mg total) by mouth daily.   traMADol 50 MG tablet Commonly known as:  ULTRAM Take 1 tablet (50 mg total) by mouth every 6 (six) hours as needed. What changed:  reasons to take this   Vitamin D3 2000 units capsule Take 1 capsule (2,000 Units total) by mouth daily.      Allergies  Allergen Reactions  . Morphine Other (See Comments) and Nausea Only    GI upset and headaches GI upset and headaches  . Statins Other (See Comments)    Statin drugs cause muscle pain / "muscle damage"--was told by MD not to take rhabdomyolysis Statin drugs cause muscle pain / "muscle damage"--was told by MD not to take  . Sulfa Antibiotics Diarrhea and Nausea Only  . Cymbalta [Duloxetine Hcl] Other (See Comments)    Restless leg syndrome Restless leg syndrome, rash, itching, and diarrhea  . Sulfasalazine Diarrhea  . Levemir [Insulin  Detemir] Itching  . Zinc Swelling, Rash, Hives and Other (See Comments)   Discharge Instructions    Diet - low sodium heart healthy   Complete by:  As directed    Discharge instructions   Complete by:  As directed    It is important that you read following instructions as well as go over your medication list with RN to help  you understand your care after this hospitalization.  Discharge Instructions: Please follow-up with PCP in one week  Please request your primary care physician to go over all Hospital Tests and Procedure/Radiological results at the follow up,  Please get all Hospital records sent to your PCP by signing hospital release before you go home.   Do not take more than prescribed Pain, Sleep and Anxiety Medications. You were cared for by a hospitalist during your hospital stay. If you have any questions about your discharge medications or the care you received while you were in the hospital after you are discharged, you can call the unit and ask to speak with the hospitalist on call if the hospitalist that took care of you is not available.  Once you are discharged, your primary care physician will handle any further medical issues. Please note that NO REFILLS for any discharge medications will be authorized once you are discharged, as it is imperative that you return to your primary care physician (or establish a relationship with a primary care physician if you do not have one) for your aftercare needs so that they can reassess your need for medications and monitor your lab values. You Must read complete instructions/literature along with all the possible adverse reactions/side effects for all the Medicines you take and that have been prescribed to you. Take any new Medicines after you have completely understood and accept all the possible adverse reactions/side effects. Wear Seat belts while driving. If you have smoked or chewed Tobacco in the last 2 yrs please stop smoking  and/or stop any Recreational drug use.   Increase activity slowly   Complete by:  As directed      Discharge Exam: Filed Weights   06/16/17 1621 06/17/17 0318  Weight: 92.1 kg (203 lb) 88.8 kg (195 lb 12.3 oz)   Vitals:   06/20/17 0519 06/20/17 1321  BP: 128/61 (!) 141/57  Pulse: (!) 48 (!) 51  Resp: 16 18  Temp: 97.9 F (36.6 C) 98.1 F (36.7 C)  SpO2: 95% 98%   General: Appear in no distress, no Rash; Oral Mucosa moist. Cardiovascular: S1 and S2 Present, aortic systolic Murmur, no JVD Respiratory: Bilateral Air entry present and Clear to Auscultation, no Crackles, no wheezes Abdomen: Bowel Sound present, Soft and no tenderness Extremities: no Pedal edema, no calf tenderness Neurology: Grossly no focal neuro deficit.  The results of significant diagnostics from this hospitalization (including imaging, microbiology, ancillary and laboratory) are listed below for reference.    Significant Diagnostic Studies: Dg Pelvis 1-2 Views  Result Date: 06/17/2017 CLINICAL DATA:  Back and lower extremity pain his left lower extremity shorter than right. EXAM: PELVIS - 1-2 VIEW COMPARISON:  Scout view of the pelvis from CT dated 05/03/2017 FINDINGS: External rotation of the left leg overlaps the greater trochanter the femoral neck limiting assessment. Joint space narrowing is identified of both hips. Calcification adjacent to the right greater trochanter would be in keeping with calcific gluteal tendinopathy. Bowel loops project over the iliac bones limiting assessment. No acute displaced fractures seen. Osteoarthritis of the SI joints. Intact pubic symphysis. IMPRESSION: 1. Suboptimal assessment of the left femoral neck due to external rotation of the femur overlapping the greater trochanter on the femoral neck. Either dedicated radiographs of the left hip with internal rotation of the femur or CT to exclude fracture is recommended as deemed clinically necessary. No acute pelvic fracture is  identified. 2. Right gluteal calcific tendinopathy. Electronically Signed   By: Shanon Brow  Randel Pigg M.D.   On: 06/17/2017 01:29   Mr Lumbar Spine W Wo Contrast  Result Date: 06/16/2017 CLINICAL DATA:  72 y/o F; lower back pain radiating to the left buttocks. History of diabetes and pancreatic cancer. EXAM: MRI LUMBAR SPINE WITHOUT AND WITH CONTRAST TECHNIQUE: Multiplanar and multiecho pulse sequences of the lumbar spine were obtained without and with intravenous contrast. CONTRAST:  69mL MULTIHANCE GADOBENATE DIMEGLUMINE 529 MG/ML IV SOLN COMPARISON:  05/03/2017 CT abdomen and pelvis. 06/24/2010 MRI of the lumbar spine. FINDINGS: Segmentation:  Standard. Alignment:  Physiologic. Vertebrae: Mild edema and enhancement within the opposing L3 and L4 endplates. Small bilateral L4-5 facet effusions. Mild edema and enhancement of the L4 and L5 spinous processes with pseudoarticulation. No loss of vertebral body height Conus medullaris and cauda equina: Conus extends to the L1 level. Conus and cauda equina appear normal. Paraspinal and other soft tissues: Subcentimeter cysts within the kidneys bilaterally. Disc levels: L1-2: No significant disc displacement, foraminal stenosis, or canal stenosis. L2-3: No significant disc displacement, foraminal stenosis, or canal stenosis. L3-4: Small disc bulge eccentric to the right and mild facet hypertrophy. Mild right foraminal stenosis. No canal stenosis. L4-5: Moderate disc bulge with facet and ligamentum flavum hypertrophy. Mild bilateral foraminal stenosis. Severe canal stenosis. L5-S1: Small disc bulge eccentric to the left with left-sided foraminal and extraforaminal endplate marginal osteophytes as well as left-greater-than-right facet hypertrophy. Mild right and moderate to severe left foraminal stenosis. Mild canal stenosis. IMPRESSION: 1. Mild opposing L3-4 endplate edema, likely degenerative. Small L4-5 facet effusions, also likely degenerative. 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 2. Lumbar spondylosis greatest at the L4-5 and L5-S1 levels. 3. Severe multifactorial L4-5 canal stenosis. 4. Moderate to severe left L5-S1 foraminal stenosis. Multilevel mild foraminal stenosis. Electronically Signed   By: Kristine Garbe M.D.   On: 06/16/2017 22:19   Ct Femur Left Wo Contrast  Result Date: 06/17/2017 CLINICAL DATA:  Left leg pain.  History of pancreatic cancer. EXAM: CT OF THE LOWER LEFT EXTREMITY WITHOUT CONTRAST TECHNIQUE: Multidetector CT imaging of the lower left extremity was performed according to the standard protocol. COMPARISON:  Pelvic radiograph 06/17/2017 FINDINGS: Bones/Joint/Cartilage The left femoral head is normally located. There are moderate hip joint degenerative changes but no fracture or AVN. The visualized left hemipelvis is intact. The left femur is intact. No fracture or bone lesion. There are advanced degenerative changes noted at the left knee joint. The surrounding hip and pelvic musculature appear grossly normal. No obvious muscle tear or hematoma. The thigh muscles appear normal. IMPRESSION: No acute bony findings. Moderate degenerative changes in the hip and advanced degenerative changes involving the knee. Electronically Signed   By: Marijo Sanes M.D.   On: 06/17/2017 17:12   Dg Chest Port 1 View  Result Date: 06/19/2017 CLINICAL DATA:  Fever. EXAM: PORTABLE CHEST 1 VIEW COMPARISON:  02/26/2017 FINDINGS: Heart size and pulmonary vascularity are normal and the lungs are clear. No infiltrates or effusions. No bone abnormality. Port-A-Cath with the tip in the superior vena cava just below the level of the carina. IMPRESSION: No active disease. Electronically Signed   By: Lorriane Shire M.D.   On: 06/19/2017 08:59    Microbiology: Recent Results (from the past 240 hour(s))  Culture, blood (routine x 2)     Status: None (Preliminary result)   Collection Time: 06/19/17  6:49 AM  Result Value Ref Range Status   Specimen Description   Final     BLOOD RIGHT ANTECUBITAL Performed at [facility], [place] [address] [place], [place] [address]    Special Requests   Final    BOTTLES DRAWN AEROBIC AND ANAEROBIC Blood Culture adequate volume Performed at [facility] [address] [place], [place] [address]    Culture   Final    NO GROWTH 2 DAYS Performed at [facility] [address], [place], [place] [address]    Report Status PENDING  Incomplete  Culture, blood (routine x 2)     Status: None (Preliminary result)   Collection Time: 06/19/17  6:55 AM  Result Value Ref Range Status   Specimen Description   Final    BLOOD LEFT HAND Performed at [facility] [address] [place], [place] [address]    Special Requests   Final    BOTTLES DRAWN AEROBIC AND ANAEROBIC Blood Culture adequate volume Performed at [place] [address], [place], [place] [address]    Culture   Final    NO GROWTH 2 DAYS Performed at [facility] [address] [place], [place] [address]    Report Status PENDING  Incomplete     Labs: CBC: Recent Labs  Lab 06/16/17 1744 06/18/17 0831 06/20/17 0444  WBC 10.3 7.5 8.5  NEUTROABS 7.3 4.7 5.3  HGB 10.0* 10.3* 9.6*  HCT 32.5* 33.8* 31.8*  MCV 85.8 86.0 86.9  PLT 268 281 765   Basic Metabolic Panel: Recent Labs  Lab 06/16/17 1744 06/18/17 0831 06/20/17 0444  NA 143 144 140  K 3.6 3.6 4.2  CL 103 104 103  CO2 27 27 26   GLUCOSE 197* 156* 132*  BUN 35* 37* 46*  CREATININE 1.05* 1.03* 1.34*  CALCIUM 8.7* 8.7* 8.7*  MG  --   --  1.9   Liver Function Tests: Recent Labs  Lab 06/18/17 0831  AST 72*  ALT 36  ALKPHOS 437*  BILITOT 0.7  PROT 6.5  ALBUMIN 2.6*   No results for input(s): LIPASE, AMYLASE in the last 168 hours. No results for input(s): AMMONIA in the last 168 hours. Cardiac Enzymes: No results for input(s): CKTOTAL, CKMB, CKMBINDEX, TROPONINI in the last 168 hours. BNP (last 3  results) Recent Labs    11/24/16 1253  BNP 58.1   CBG: Recent Labs  Lab 06/19/17 1719 06/19/17 2033 06/20/17 0813 06/20/17 1156 06/20/17 1656  GLUCAP 133* 123* 133* 297* 286*   Time spent: 35 minutes  Signed:  Berle Mull  [facility] 06/20/2017   , 10:13 PM

## 2017-06-22 ENCOUNTER — Telehealth: Payer: Self-pay | Admitting: *Deleted

## 2017-06-22 DIAGNOSIS — Z7982 Long term (current) use of aspirin: Secondary | ICD-10-CM | POA: Diagnosis not present

## 2017-06-22 DIAGNOSIS — D649 Anemia, unspecified: Secondary | ICD-10-CM | POA: Diagnosis not present

## 2017-06-22 DIAGNOSIS — M5136 Other intervertebral disc degeneration, lumbar region: Secondary | ICD-10-CM | POA: Diagnosis not present

## 2017-06-22 DIAGNOSIS — Z794 Long term (current) use of insulin: Secondary | ICD-10-CM | POA: Diagnosis not present

## 2017-06-22 DIAGNOSIS — K219 Gastro-esophageal reflux disease without esophagitis: Secondary | ICD-10-CM | POA: Diagnosis not present

## 2017-06-22 DIAGNOSIS — K529 Noninfective gastroenteritis and colitis, unspecified: Secondary | ICD-10-CM | POA: Diagnosis not present

## 2017-06-22 DIAGNOSIS — M4807 Spinal stenosis, lumbosacral region: Secondary | ICD-10-CM | POA: Diagnosis not present

## 2017-06-22 DIAGNOSIS — C250 Malignant neoplasm of head of pancreas: Secondary | ICD-10-CM | POA: Diagnosis not present

## 2017-06-22 DIAGNOSIS — M47817 Spondylosis without myelopathy or radiculopathy, lumbosacral region: Secondary | ICD-10-CM | POA: Diagnosis not present

## 2017-06-22 DIAGNOSIS — E1140 Type 2 diabetes mellitus with diabetic neuropathy, unspecified: Secondary | ICD-10-CM | POA: Diagnosis not present

## 2017-06-22 DIAGNOSIS — G8929 Other chronic pain: Secondary | ICD-10-CM | POA: Diagnosis not present

## 2017-06-22 DIAGNOSIS — I5030 Unspecified diastolic (congestive) heart failure: Secondary | ICD-10-CM | POA: Diagnosis not present

## 2017-06-22 DIAGNOSIS — I110 Hypertensive heart disease with heart failure: Secondary | ICD-10-CM | POA: Diagnosis not present

## 2017-06-22 DIAGNOSIS — M4826 Kissing spine, lumbar region: Secondary | ICD-10-CM | POA: Diagnosis not present

## 2017-06-22 DIAGNOSIS — M1990 Unspecified osteoarthritis, unspecified site: Secondary | ICD-10-CM | POA: Diagnosis not present

## 2017-06-22 DIAGNOSIS — M47816 Spondylosis without myelopathy or radiculopathy, lumbar region: Secondary | ICD-10-CM | POA: Diagnosis not present

## 2017-06-22 DIAGNOSIS — M48061 Spinal stenosis, lumbar region without neurogenic claudication: Secondary | ICD-10-CM | POA: Diagnosis not present

## 2017-06-22 DIAGNOSIS — E785 Hyperlipidemia, unspecified: Secondary | ICD-10-CM | POA: Diagnosis not present

## 2017-06-22 DIAGNOSIS — H9192 Unspecified hearing loss, left ear: Secondary | ICD-10-CM | POA: Diagnosis not present

## 2017-06-22 DIAGNOSIS — I872 Venous insufficiency (chronic) (peripheral): Secondary | ICD-10-CM | POA: Diagnosis not present

## 2017-06-22 NOTE — Telephone Encounter (Signed)
Transition Care Management Follow-up Telephone Call   Date discharged? 06/20/17   How have you been since you were released from the hospital? Pt states she's doing ok still not able to work at times. Legs are very painful   Do you understand why you were in the hospital? YES   Do you understand the discharge instructions? YES   Where were you discharged to? Home   Items Reviewed:  Medications reviewed: YES  Allergies reviewed: YES  Dietary changes reviewed: YES, cardiac and carb modified diet  Referrals reviewed: No referral needed  Functional Questionnaire:   Activities of Daily Living (ADLs):   She states she are independent in the following: feeding, continence, grooming, toileting and dressing States they require assistance with the following: ambulation and bathing and hygiene   Any transportation issues/concerns?: NO   Any patient concerns? NO   Confirmed importance and date/time of follow-up visits scheduled YES, appt 06/29/17  Provider Appointment booked with Dr. Sharlet Salina  Confirmed with patient if condition begins to worsen call PCP or go to the ER.  Patient was given the office number and encouraged to call back with question or concerns.  : YES

## 2017-06-23 ENCOUNTER — Telehealth: Payer: Self-pay | Admitting: Internal Medicine

## 2017-06-23 NOTE — Telephone Encounter (Unsigned)
Copied from [place] [id number]. Topic: Quick Communication - See Telephone Encounter >> Jun 23, 2017  1:05 PM Percell Belt A wrote: CRM for notification. See Telephone encounter for: 06/23/17. PT care giver called in (denise) called in said pt was released from hosp and is in major pain from her back.  She was released from hosp with just hydrocodone.  Pt was on morphine in hosp and she stated that the hydrocodone is not touching the pain and wants to know what she needs to do?  Pt has an appt on 06/29/2017  Best number [phone]

## 2017-06-23 NOTE — Telephone Encounter (Signed)
While in the hospital she had toxic encephalopathy due to being on too much medication -- I do not feel comfortable prescribing her any pain medications - she needs to be seen

## 2017-06-23 NOTE — Telephone Encounter (Signed)
Called patient back and daughter states that the patient is in no way to leave the house so soon from the hospital and is in extreme pain. Daughter is not sure why patient was only given hydrocodone leaving the hospital when she was on morphine at the hospital. Dr. Sharlet Salina is gone till 06/28/2017. They are wondering if there is anything that can be done till the appointment on 06/29/2017

## 2017-06-23 NOTE — Telephone Encounter (Signed)
Please advise per doctor crawford's absence. Thank you  

## 2017-06-23 NOTE — Telephone Encounter (Signed)
Should be seen - can she come in tomorrow or Monday to see crawford since she knows her

## 2017-06-24 LAB — CULTURE, BLOOD (ROUTINE X 2)
Culture: NO GROWTH
Culture: NO GROWTH
Special Requests: ADEQUATE
Special Requests: ADEQUATE

## 2017-06-26 ENCOUNTER — Telehealth: Payer: Self-pay | Admitting: Internal Medicine

## 2017-06-26 NOTE — Telephone Encounter (Signed)
Copied from [place] [id number]. Topic: Quick Communication - See Telephone Encounter >> Jun 26, 2017 11:54 AM Bea Graff, NT wrote: CRM for notification. See Telephone encounter for: 06/26/17. Alexis with [facility] calling to obtain nursing orders for this pt with a frequency of 2xs a week for 1 week and 1 time a week for 4 weeks to monitor pt since she was just discharged from the hospital. CB#: [phone]

## 2017-06-26 NOTE — Telephone Encounter (Signed)
Patient informed of MD response and stated understanding  

## 2017-06-27 NOTE — Telephone Encounter (Signed)
Ok Thx 

## 2017-06-27 NOTE — Telephone Encounter (Signed)
MD is out of the office pls advise on msg below../lmb 

## 2017-06-28 ENCOUNTER — Telehealth: Payer: Self-pay | Admitting: Internal Medicine

## 2017-06-28 NOTE — Telephone Encounter (Signed)
LVM for tracy to call back

## 2017-06-28 NOTE — Telephone Encounter (Signed)
Called Melissa Myers back no answer LMOM w/MD response.Marland KitchenJohny Myers

## 2017-06-28 NOTE — Telephone Encounter (Signed)
Audry Riles called back and wanted to update [place] on patient medication changes from recent hospital visit. One was sertraline which is on discharge summary but patient has been off this medication and taking mirtazapine.  On lyrica once a day but Patient has been having serious leg pain and is wanting to know if they could do lyrica twice a day instead. Ropinirole is now 2 times a day instead of three. Gabapentin was stopped and patient was on  2 tablets four times a day. Is also taking torsemide.  tracey saw patient yesterday and the paid caregiver stated that the patient has been out of it. tracey states that patients memory is getting worse forgetting that she even went to the hospital or doctors appointments.

## 2017-06-28 NOTE — Telephone Encounter (Signed)
Copied from [place] [id number]. Topic: Quick Communication - See Telephone Encounter >> Jun 28, 2017 12:17 PM Bea Graff, NT wrote: CRM for notification. See Telephone encounter for: 06/28/17. Tracey with [facility] calling to speak with Dr.  Charlynne Cousins nurse to verify some medications and to give updates on this pt before her appointment tomorrow with Dr. Sharlet Salina. CB#: [phone].

## 2017-06-29 ENCOUNTER — Ambulatory Visit (INDEPENDENT_AMBULATORY_CARE_PROVIDER_SITE_OTHER): Payer: PPO | Admitting: Internal Medicine

## 2017-06-29 ENCOUNTER — Encounter: Payer: Self-pay | Admitting: Internal Medicine

## 2017-06-29 DIAGNOSIS — C250 Malignant neoplasm of head of pancreas: Secondary | ICD-10-CM

## 2017-06-29 DIAGNOSIS — Z7189 Other specified counseling: Secondary | ICD-10-CM

## 2017-06-29 DIAGNOSIS — M549 Dorsalgia, unspecified: Secondary | ICD-10-CM

## 2017-06-29 MED ORDER — PREGABALIN 75 MG PO CAPS: 75.0000 mg | ORAL_CAPSULE | Freq: Two times a day (BID) | ORAL | 3 refills | Status: AC

## 2017-06-29 NOTE — Progress Notes (Addendum)
   Subjective:    Patient ID: Melissa Myers, female    DOB: 03-21-1945, 72 y.o.   MRN: [id number]  HPI The patient is a 72 YO female coming in for hospital follow up (admitted for intractable pain, lyrica decrease to daily, requip decreased to BID and gabapentin held, remeron switched to zoloft). She is doing okay since leaving the hospital. She is still having some sedation at home (this is an ongoing issue). She is using tramadol or vicodin for severe pain. Lyrica daily. No gabapentin and requip BID. Her family is with her and thinks that her leg pain is actually some improved lately. She is stable on regimen. She is not currently a candidate for any kind of treatment for her stage 1B pancreatic cancer. She was not a good surgical candidate and chemo was not effective. She is curious about her prognosis. She is suffering every day. Palliative care is still seeing her at home and they have benefited from the constant contact with a consistent person.   PMH, [facility], social history reviewed and updated  Review of Systems  Constitutional: Positive for activity change, appetite change and fatigue.  HENT: Negative.   Eyes: Negative.   Respiratory: Negative for cough, chest tightness and shortness of breath.   Cardiovascular: Negative for chest pain, palpitations and leg swelling.  Gastrointestinal: Negative for abdominal distention, abdominal pain, constipation, diarrhea, nausea and vomiting.  Musculoskeletal: Positive for arthralgias, gait problem and myalgias.  Skin: Negative.   Neurological: Positive for weakness and numbness. Negative for dizziness and headaches.  Psychiatric/Behavioral: Negative.       Objective:   Physical Exam  Constitutional: She is oriented to person, place, and time. She appears well-developed and well-nourished.  overweight  HENT:  Head: Normocephalic and atraumatic.  Eyes: EOM are normal.  Neck: Normal range of motion.  Cardiovascular: Normal rate and regular  rhythm.  Pulmonary/Chest: Effort normal and breath sounds normal. No respiratory distress. She has no wheezes. She has no rales.  Abdominal: Soft. Bowel sounds are normal. She exhibits no distension. There is no tenderness. There is no rebound.  Musculoskeletal: She exhibits edema.  Stable bilateral edema  Neurological: She is alert and oriented to person, place, and time. Coordination abnormal.  Wheelchair, walker at home for short distances  Skin: Skin is warm and dry.  Psychiatric: She has a normal mood and affect.   Vitals:   06/29/17 1048  BP: (!) 120/52  Pulse: 75  Temp: 98.7 F (37.1 C)  TempSrc: Oral  SpO2: 94%  Weight: 205 lb (93 kg)  Height: 5' (1.524 m)      Assessment & Plan:

## 2017-06-29 NOTE — Patient Instructions (Signed)
We will increase the lyrica to twice a day and have sent in a new prescription.   It is okay to take both remeron (mirtazepine) and zoloft (sertraline).

## 2017-06-30 ENCOUNTER — Telehealth: Payer: Self-pay | Admitting: Internal Medicine

## 2017-06-30 NOTE — Assessment & Plan Note (Signed)
Using vicodin and tramadol successfully. Will change lyrica to BID and continue off gabapentin as she has not seen a difference without it.

## 2017-06-30 NOTE — Assessment & Plan Note (Signed)
Stage 1B cancer and she is not a surgical candidate. She has undergone chemo with bad side effects and I would not recommend further treatment to them. She is under care of palliative care. Goal for her care is comfort and quality time with family. She is doing well with pain control right now and using vicodin and tramadol as well as lyrica and requip. Increase lyrica to BID and stop gabapentin as she has not noticed a change in pain being off and risk of sedation increases with each additional agent.

## 2017-06-30 NOTE — Telephone Encounter (Signed)
LVM giving verbals 

## 2017-06-30 NOTE — Assessment & Plan Note (Signed)
We talked again about how the goal for her treatment is palliation and not cure. She is curious about prognosis and most data for pancreatic 1B is post-surgery and since she is not a candidate her outcome is likely worse than these but she is already 1 year from diagnosis.

## 2017-06-30 NOTE — Telephone Encounter (Signed)
Okay with verbal

## 2017-06-30 NOTE — Telephone Encounter (Unsigned)
Copied from [place] [id number]. Topic: Quick Communication - See Telephone Encounter >> Jun 30, 2017 10:16 AM Percell Belt A wrote: CRM for notification. See Telephone encounter for: 06/30/17.  Cindy from [facility]  [phone] Need to get order for palliative care referral

## 2017-07-06 ENCOUNTER — Other Ambulatory Visit: Payer: Self-pay | Admitting: Internal Medicine

## 2017-07-06 DIAGNOSIS — Z515 Encounter for palliative care: Secondary | ICD-10-CM

## 2017-07-06 NOTE — Progress Notes (Signed)
Arrived to home; upon discussion with family discovered that patient already established with [facility]. I called and verified this information with that agency. Will sign off.

## 2017-07-11 ENCOUNTER — Ambulatory Visit (HOSPITAL_COMMUNITY)
Admission: RE | Admit: 2017-07-11 | Discharge: 2017-07-11 | Disposition: A | Discharge status: Home / Self Care | Payer: PPO | Source: Ambulatory Visit | Attending: Cardiology | Admitting: Cardiology

## 2017-07-11 ENCOUNTER — Other Ambulatory Visit: Payer: Self-pay

## 2017-07-11 ENCOUNTER — Encounter (HOSPITAL_COMMUNITY): Payer: Self-pay

## 2017-07-11 ENCOUNTER — Encounter (HOSPITAL_COMMUNITY): Payer: PPO | Admitting: Cardiology

## 2017-07-11 VITALS — BP 124/60 | HR 82 | Wt 194.2 lb

## 2017-07-11 DIAGNOSIS — C250 Malignant neoplasm of head of pancreas: Secondary | ICD-10-CM

## 2017-07-11 DIAGNOSIS — Z811 Family history of alcohol abuse and dependence: Secondary | ICD-10-CM | POA: Insufficient documentation

## 2017-07-11 DIAGNOSIS — E781 Pure hyperglyceridemia: Secondary | ICD-10-CM | POA: Insufficient documentation

## 2017-07-11 DIAGNOSIS — I110 Hypertensive heart disease with heart failure: Secondary | ICD-10-CM | POA: Insufficient documentation

## 2017-07-11 DIAGNOSIS — Z8507 Personal history of malignant neoplasm of pancreas: Secondary | ICD-10-CM | POA: Insufficient documentation

## 2017-07-11 DIAGNOSIS — L03115 Cellulitis of right lower limb: Secondary | ICD-10-CM | POA: Diagnosis not present

## 2017-07-11 DIAGNOSIS — E1142 Type 2 diabetes mellitus with diabetic polyneuropathy: Secondary | ICD-10-CM | POA: Insufficient documentation

## 2017-07-11 DIAGNOSIS — C259 Malignant neoplasm of pancreas, unspecified: Secondary | ICD-10-CM | POA: Insufficient documentation

## 2017-07-11 DIAGNOSIS — G2581 Restless legs syndrome: Secondary | ICD-10-CM | POA: Insufficient documentation

## 2017-07-11 DIAGNOSIS — Z09 Encounter for follow-up examination after completed treatment for conditions other than malignant neoplasm: Secondary | ICD-10-CM | POA: Insufficient documentation

## 2017-07-11 DIAGNOSIS — Z800 Family history of malignant neoplasm of digestive organs: Secondary | ICD-10-CM | POA: Insufficient documentation

## 2017-07-11 DIAGNOSIS — I10 Essential (primary) hypertension: Secondary | ICD-10-CM

## 2017-07-11 DIAGNOSIS — Z8249 Family history of ischemic heart disease and other diseases of the circulatory system: Secondary | ICD-10-CM | POA: Insufficient documentation

## 2017-07-11 DIAGNOSIS — Z794 Long term (current) use of insulin: Secondary | ICD-10-CM | POA: Diagnosis not present

## 2017-07-11 DIAGNOSIS — Z801 Family history of malignant neoplasm of trachea, bronchus and lung: Secondary | ICD-10-CM | POA: Diagnosis not present

## 2017-07-11 DIAGNOSIS — E669 Obesity, unspecified: Secondary | ICD-10-CM | POA: Diagnosis not present

## 2017-07-11 DIAGNOSIS — R011 Cardiac murmur, unspecified: Secondary | ICD-10-CM | POA: Diagnosis not present

## 2017-07-11 DIAGNOSIS — Z79891 Long term (current) use of opiate analgesic: Secondary | ICD-10-CM | POA: Insufficient documentation

## 2017-07-11 DIAGNOSIS — Z7982 Long term (current) use of aspirin: Secondary | ICD-10-CM | POA: Diagnosis not present

## 2017-07-11 DIAGNOSIS — I5032 Chronic diastolic (congestive) heart failure: Secondary | ICD-10-CM | POA: Insufficient documentation

## 2017-07-11 DIAGNOSIS — Z833 Family history of diabetes mellitus: Secondary | ICD-10-CM | POA: Diagnosis not present

## 2017-07-11 DIAGNOSIS — R079 Chest pain, unspecified: Secondary | ICD-10-CM | POA: Diagnosis not present

## 2017-07-11 DIAGNOSIS — G4733 Obstructive sleep apnea (adult) (pediatric): Secondary | ICD-10-CM | POA: Insufficient documentation

## 2017-07-11 DIAGNOSIS — Z79899 Other long term (current) drug therapy: Secondary | ICD-10-CM | POA: Diagnosis not present

## 2017-07-11 LAB — BASIC METABOLIC PANEL
Anion gap: 11 (ref 5–15)
BUN: 39 mg/dL — AB (ref 6–20)
CALCIUM: 9 mg/dL (ref 8.9–10.3)
CO2: 28 mmol/L (ref 22–32)
Chloride: 96 mmol/L — ABNORMAL LOW (ref 101–111)
Creatinine, Ser: 1.1 mg/dL — ABNORMAL HIGH (ref 0.44–1.00)
GFR calc Af Amer: 57 mL/min — ABNORMAL LOW (ref >60)
GFR, EST NON AFRICAN AMERICAN: 49 mL/min — AB (ref >60)
GLUCOSE: 188 mg/dL — AB (ref 65–99)
Potassium: 4.7 mmol/L (ref 3.5–5.1)
Sodium: 135 mmol/L (ref 135–145)

## 2017-07-11 NOTE — Progress Notes (Signed)
[place]  [phone] [phone]  Clinic Follow Up Note   Patient Care Team: [name], MD as PCP - General (Internal Medicine) Rigoberto Noel, MD as Consulting Physician (Pulmonary Disease) Larey Dresser, MD as Consulting Physician (Cardiology) Megan Salon, MD as Consulting Physician (Gynecology) Kristeen Miss, MD as Consulting Physician (Neurosurgery) Earlie Server, MD (Orthopedic Surgery) Almedia Balls, MD (Orthopedic Surgery) Lafayette Dragon, MD (Inactive) (Gastroenterology) Renato Shin, MD (Endocrinology) Darleen Crocker, MD (Ophthalmology) Jackelyn Knife, MD as Rounding Team (Internal Medicine)   Date of Service: 07/12/2017  CHIEF COMPLAINTS:  Follow up pancreatic Cancer   Oncology History   Cancer Staging Malignant neoplasm of head of pancreas [facility]) Staging form: Exocrine Pancreas, AJCC 8th Edition - Clinical stage from 04/28/2016: Stage IB (cT2, cN0, cM0) - Signed by Truitt Merle, MD on 02/01/2017       Malignant neoplasm of head of pancreas ([place])   02/2016 Initial Diagnosis    Malignant neoplasm of head of pancreas ([place])      03/15/2016 - 03/21/2016 Hospital Admission    Presents with nausea, vomiting, and jaundice   Patient was found hyperbilirubinemic with abnormal abdominal CT suggesting obstructive physiology. Referred for admission and evaluation.      03/15/2016 Imaging    CT Abdomen Pelvis w Contrast 1. Cholecystectomy with borderline biliary duct dilatation. 2. Pancreatic atrophy with moderate pancreatic duct dilatation, followed to the level of the duodenum. 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 Considerations include otherwise occult stricture, periampullary lesion, or main duct intraductal papillary mucinous neoplasm. Given the combination of borderline biliary duct dilatation, moderate pancreatic duct dilatation, and the clinical history of jaundice, consider further evaluation with ERCP.  If the patient is not a good ERCP candidate, MRCP with and without contrast would be another option. 3.  No acute process in the abdomen or pelvis. 4.  Tiny hiatal hernia. 5.  Aortic atherosclerosis.      03/16/2016 Imaging    MRI Abdomen 1. Hypoenhancing lesion in the uncinate process of the pancreas, better demonstrated by MR than on the recent CT scan. The dilated main pancreatic duct and dilated common bile duct abruptly terminate at the cranial margin of this lesion. MR imaging features are concerning for pancreatic adenocarcinoma. ERCP with endoscopic ultrasound would likely prove helpful to further evaluate. 2. No definite metastatic disease in the liver. No peripancreatic lymphadenopathy is identified. Of note, this exam is limited by marked motion degradation and small liver lesions could be obscured. CT scan earlier today demonstrated no peripancreatic lymphadenopathy.      03/18/2016 Procedure    ERCP 1. Malignant-appearing distal bile duct stricturestatus post ERCP with sphincterotomy and plastic biliary stent placement 2. Guidewire finding false lumen with subsequentinjection as described 3. Status post cholecystectomy  Recommendation: 1. Clear liquid diet 2. Unasyn 1.5 g every 6 hours 3. Standard post ERCP observation. Indomethacin suppositories given 4. Trend LFTs 5. Will need endoscopic ultrasound with biopsy with Dr. Ardis Hughs outpatient.      04/28/2016 Pathology Results    Endoscopic [place] and Biopsy FINE NEEDLE ASPIRATION, ENDOSCOPIC, PANCREAS UNCINATE (SPECIMEN 1 OF 1 COLLECTED 04/28/16): MALIGNANT CELLS CONSISTENT WITH ADENOCARCINOMA.      05/04/2016 -  Hospital Admission    Presents with fever, abdominal pain, malaise, confusion. She was treated for acute cholangitis.       05/05/2016 Imaging    CT Abdomen Pelvis w Contrast Interval placement of a bile duct stent with some decompression of the bile ducts. Mild residual  biliary dilatation. Pancreatic  ductal dilatation and atrophy. Known pancreatic mass lesion is not well depicted at CT. No evidence of bowel obstruction or inflammation.      05/05/2016 Imaging    CT Head w/o Contrast 05/05/16 IMPRESSION: No acute intracranial pathology.      05/06/2016 Procedure    ERCP and Bilary stent placement  Imaging demonstrating removal of endoscopic common bile duct stent. The stent was not able to be replaced.       05/07/2016 Imaging    CT Chest wo Contrast  1. New small right and trace left pleural effusions. Mild cardiomegaly. 2. Borderline enlarged right hilar lymph node at 1 cm in short axis. 3. No definite findings of metastatic disease to the chest. 4. Centrilobular emphysema. 5. High density in the visualized part of the dorsal pancreatic duct, probably contrast medium. 6. Subacute healing fracture of the right seventh rib laterally. 7. Atherosclerosis, including the left anterior descending coronary artery.      05/07/2016 Procedure    On 05/07/16, percutaneous transhepatic cholangiogram and placement of a 38F internal/external biliary drain was performed by IR with aspirated purulent bile sent for culture. Bile culture grew pantoea species, enterococcus faecalis, viridans streptococcus.      07/11/2016 - 07/21/2016 Radiation Therapy    Diagnosis:   cT2N0M0  adenocarcinoma of the pancreas  Indication for treatment:  Curative  Radiation treatment dates:   07/11/16 - 07/21/16  Site/dose:   Pancreas: 33 Gy in 5 fractions  Beams/energy:   SBRT/SRT-VMAT // 10X-FFF Photon  Narrative: The patient tolerated radiation treatment relatively well. The patient complained of diarrhea during treatment. She was instructed to purchase Imodium.      08/2016 - 10/2016 Chemotherapy    She had chemotherapy consistent of gemcitabine and abraxine for 2-3 months under the care of Dr. Berneice Gandy at [facility] with her last treatment being about 10/2016.        02/20/2017 Imaging    CT CAP 02/20/17   IMPRESSION: 1. Response to therapy of the primary pancreatic head/uncinate process lesion. Please note that delineation of the tumor is difficult secondary to anatomic distortion from therapy. 2. No new or progressive disease identified. 3. Removal of biliary stent, with pneumobilia and borderline similar intrahepatic biliary duct dilatation. 4.  No acute process or evidence of metastatic disease in the chest. 5.  Possible constipation. 6. Coronary artery atherosclerosis. Aortic Atherosclerosis (ICD10-I70.0). 7.  Emphysema (ICD10-J43.9). 8. Pulmonary artery enlargement suggests pulmonary arterial hypertension.      02/22/2017 - 04/26/2017 Chemotherapy    Single agent Gemcitabine every 2-3 weeks on and 1 week off, starting 02/22/17. Stopped after cycle 6 on 04/26/17 due to low benefit and intolerance.      05/03/2017 Imaging    IMPRESSION: 1. Subjectively the pancreatic head and uncinate process mass appears similar to the prior exam. The lesion is associated with about 50% circumferential involvement of the SMA and SMV. No compelling findings of distant metastatic spread at this time. 2. Mild biliary dilatation extending down to the pancreatic head mass. Mild pneumobilia. 3. There is mildly increased infiltrative edema along the porta hepatis. 4. Other imaging findings of potential clinical significance: Prominent stool throughout the colon favors constipation. Aortic Atherosclerosis (ICD10-I70.0). Coronary atherosclerosis. Mitral valve calcification. Mild cardiomegaly. Free osteochondral fragments in the right glenohumeral joint. Lower lumbar spondylosis and degenerative disc disease. 2 stable nonspecific hypodense splenic lesions. Emphysema (ICD10-J43.9).      06/09/2017 Genetic Testing    The Common Hereditary Cancers Panel +  Nervous System/Brain Cancer Panel was ordered (57 genes). The following genes were evaluated for sequence changes and exonic deletions/duplications: ALK, APC, ATM,  AXIN2, BARD1, BMPR1A, BRCA1, BRCA2, BRIP1, CDH1, CDK4, CDKN2A (p14ARF), CDKN2A (p16INK4a), CHEK2, CTNNA1, DICER1, EPCAM*, GREM1*, HRAS, KIT, MEN1, MLH1, MSH2, MSH3, MSH6, MUTYH, NBN, NF1, NF2, PALB2, PDGFRA, PHOX2B*, PMS2, POLD1, POLE, PRKAR1A, PTCH1, PTEN, RAD50, RAD51C, RAD51D, RB1, SDHB, SDHC, SDHD, SMAD4, SMARCA4, SMARCB1, SMARCE1, STK11, SUFU, TP53, TSC1, TSC2, VHL. The following genes were evaluated for sequence changes only: HOXB13*, NTHL1*, SDHA  Results: No pathogenic variants identified.  A variant of uncertain significance (VUS) in the gene MLH1 was identified c.299G>A (p.Arg100Gln).  VUS should not be used to make medical  Management decisions.  The date of this test reports  Is 06/09/2017.         HISTORY OF PRESENTING ILLNESS (05/19/2016):  Melissa Myers 72 y.o. female is here because the patient presented to the ED on 03/15/16 due to weakness, hyperglycemia, jaundice, abdominal pain, mild confusion, and chronic diarrhea. Physical exam at the time revealed abdominal distension and diffuse tenderness. Labs found the patient hyperbilirubinemic. CT scan of the abdomen showed pancreatic atrophy with moderate pancreatic duct dilatation, followed to the level of the duodenum. 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 Considerations include otherwise occult stricture, periampullary lesion, or main duct intraductal papillary mucinous neoplasm. MRI of the abdomen the same day showed a 2.2 x 2.8 cm hyphenating lesion in the uncinate process of the pancreas, better demonstrated by MRI than on the recent CT scan. The dilated main pancreatic duct and dilated common bile duct abruptly terminate at the cranial margin of this lesion. MRI features are concerning for pancreatic adenocarcinoma.CA 19-9 on 03/17/16 was 107; elevated.  The patient underwent ERCP, with biliary sphincterotomy and biliary stent placement by Dr. Henrene Pastor 03/18/16. The patient was subsequently discharged on 03/21/16.  On 03/27/16, the patient  presented to the ED on 03/29/16 for flu like symptoms. She was Influenza A positive. She was given fluids and discharged with Tamiflu. She presented back to the ED the following day for worsening symptoms. She was started on antibiotics for possible developing pneumonia giving a productive cough.  Given enough time to recover from her hospitalization and the flu, the patient underwent biopsy of the pancreatic mass by Dr. Ardis Hughs on 04/28/16 revealing adenocarcinoma.  Since then, the patient presented to the ED on 05/04/16 for a headaches, hyperglycemia, confusion, abdominal pain, and nausea. Given presentation of fever/confusion, she was admitted to the hospital.  CT of the head on 05/05/16 was negative. CT of the abd/pelvis on 05/05/16 showed interval placement of a bile duct stent with some decompression of the bile ducts, mild residual biliary dilatation, and pancreatic ductal dilatation and atrophy.  She was diagnosed with cholangitis and recurrent jaundice. Zosyn was started. ERCP on 05/06/16 showed a clogged stent which was removed, though a new stent was not able to be placed across a malignant biliary stricture. On 05/07/16, percutaneous transhepatic cholangiogram and placement of a 21F internal/external biliary drain was performed by IR with aspirated purulent bile sent for culture. Blood cultures remained negative, though bile culture grew pantoea species, enterococcus faecalis, viridans streptococcus.Urine culture had also grown Klebsiella pneumoniae, sensitive to Zosyn.  I see the patient today in an inpatient setting to discuss treatment options for the management of her pancreatic adenocarcinoma. I met patient, her husband and son in her room. According to patient's family, she used to be independent, does self care and housework, walks with a walker.  Due to fatigue, anorexia, she has not been able to perform her daily activities in the past 4-6 weeks, and has spent most time in bed and chair for  the past few weeks.    CURRENT THERAPY: supportive care    INTERIM HISTORY:  Melissa Myers returns for follow-up. She was accompanied by her caretaker Langley Gauss and her husband. She notes she is not sure how well she is doing at home because she does not remember. Her husband and Caretaker are better historians for her. They note she has been very fatigued and tired. She had presented to the ED on 06/16/17 for back pain. She takes tramadol once a day. She needs a refill. They note her hydrocodone is not working but oxycodone does help her. She still is on half lasix. They note [facility] has set a Palliative home care nurse Linus Orn to see patient once a week. They hope to continue having her. They have been working with her for the past 1-2 years.    On review of symptoms, pt notes would be hungry but will not eat because she does not like the food or taste. This is the same with ensure. She denies fever or dysuria.    MEDICAL HISTORY:  Past Medical History:  Diagnosis Date  . Anemia   . Cancer [facility])    Pancreatic  . Cellulitis    LOWER EXTREMITIES  . Chronic diarrhea    a/w nausea - felt related to IBS  . Deaf    left side only  . Diastolic CHF ([place])   . Disc degeneration, lumbar   . Family history of breast cancer   . Family history of ovarian cancer   . Family history of stomach cancer   . GERD (gastroesophageal reflux disease)   . Hyperlipidemia    hx rhabdo on statins  . Hypertension   . Neuropathy    feet, toes and fingers  . On home oxygen therapy    uses oxygen 2 liters min per [place] at night and prn during day  . OSA (obstructive sleep apnea)    05/2009 sleep study - refuses CPAP  . Osteoarthritis   . RLS (restless legs syndrome)   . Shortness of breath    chronic  . Stasis dermatitis   . Type II or unspecified type diabetes mellitus without mention of complication, not stated as uncontrolled    insulin dep    SURGICAL HISTORY: Past Surgical History:  Procedure  Laterality Date  . BILIARY STENT PLACEMENT N/A 05/06/2016   Procedure: BILIARY STENT PLACEMENT;  Surgeon: Gatha Mayer, MD;  Location: [facility];  Service: Endoscopy;  Laterality: N/A;  . CHOLECYSTECTOMY  1997  . COLONOSCOPY N/A 12/03/2012   Procedure: COLONOSCOPY;  Surgeon: Lafayette Dragon, MD;  Location: [facility];  Service: Endoscopy;  Laterality: N/A;  . ENDOSCOPIC RETROGRADE CHOLANGIOPANCREATOGRAPHY (ERCP) WITH PROPOFOL N/A 05/06/2016   Procedure: ENDOSCOPIC RETROGRADE CHOLANGIOPANCREATOGRAPHY (ERCP) WITH PROPOFOL;  Surgeon: Gatha Mayer, MD;  Location: [facility];  Service: Endoscopy;  Laterality: N/A;  . ERCP N/A 03/18/2016   Procedure: ENDOSCOPIC RETROGRADE CHOLANGIOPANCREATOGRAPHY (ERCP);  Surgeon: Irene Shipper, MD;  Location: [facility];  Service: Endoscopy;  Laterality: N/A;  . EUS N/A 04/28/2016   Procedure: UPPER ENDOSCOPIC ULTRASOUND (EUS) LINEAR;  Surgeon: [name], MD;  Location: [facility];  Service: Endoscopy;  Laterality: N/A;  . IR CHOLANGIOGRAM EXISTING TUBE  06/09/2016  . IR GENERIC HISTORICAL  05/07/2016   IR BILIARY DRAIN PLACEMENT WITH CHOLANGIOGRAM  05/07/2016 Jacqulynn Cadet, MD [facility]  . IR GENERIC HISTORICAL  05/19/2016   IR BILIARY STENT(S) EXISTING ACCESS INC DILATION CATH EXCHANGE 05/19/2016 Jacqulynn Cadet, MD [facility]-INTER[facility] RAD  . TONSILLECTOMY  1970  . TUBAL LIGATION  1980  . UMBILICAL HERNIA REPAIR  1995  . uterine polyp removal  2008    SOCIAL HISTORY: Social History   Socioeconomic History  . Marital status: Married    Spouse name: Not on file  . Number of children: Not on file  . Years of education: Not on file  . Highest education level: Not on file  Occupational History  . Not on file  Social Needs  . Financial resource strain: Not on file  . Food insecurity:    Worry: Not on file    Inability: Not on file  . Transportation needs:    Medical: Not on file    Non-medical: Not on file  Tobacco Use  . Smoking status: Never  Smoker  . Smokeless tobacco: Never Used  Substance and Sexual Activity  . Alcohol use: No  . Drug use: No  . Sexual activity: Never  Lifestyle  . Physical activity:    Days per week: Not on file    Minutes per session: Not on file  . Stress: Not on file  Relationships  . Social connections:    Talks on phone: Not on file    Gets together: Not on file    Attends religious service: Not on file    Active member of club or organization: Not on file    Attends meetings of clubs or organizations: Not on file    Relationship status: Not on file  . Intimate partner violence:    Fear of current or ex partner: Not on file    Emotionally abused: Not on file    Physically abused: Not on file    Forced sexual activity: Not on file  Other Topics Concern  . Not on file  Social History Narrative   Lives with spouse and mother. Retired Network engineer, now housewife    FAMILY HISTORY: Family History  Problem Relation Age of Onset  . Heart disease Mother   . Heart attack Mother 53  . Heart disease Father   . Heart attack Father 26  . Heart disease Unknown        family history  . Stomach cancer Paternal Grandmother   . Lung cancer Paternal Grandfather   . CVA Unknown        several aunts  . Heart attack Unknown        several aunts and an uncle  . Colon cancer Neg Hx     ALLERGIES:  is allergic to morphine; statins; sulfa antibiotics; cymbalta [duloxetine hcl]; sulfasalazine; levemir [insulin detemir]; and zinc.  MEDICATIONS:  Current Outpatient Medications  Medication Sig Dispense Refill  . aspirin EC 81 MG tablet Take 1 tablet (81 mg total) by mouth daily.    . baclofen (LIORESAL) 10 MG tablet Take 0.5 tablets (5 mg total) by mouth 3 (three) times daily. (Patient taking differently: Take 10 mg by mouth 3 (three) times daily. ) 30 each 0  . carvedilol (COREG) 25 MG tablet TAKE ONE TABLET TWICE DAILY WITH FOOD 60 tablet 5  . Cholecalciferol (VITAMIN D3) 2000 units capsule Take 1  capsule (2,000 Units total) by mouth daily. 90 capsule 3  . Continuous Blood Gluc Sensor (FREESTYLE LIBRE SENSOR SYSTEM) MISC Use to check sugars 1 each 0  . docusate  sodium (COLACE) 100 MG capsule Take 1 capsule (100 mg total) by mouth 2 (two) times daily. 30 capsule 2  . HYDROcodone-acetaminophen (NORCO/VICODIN) 5-325 MG tablet Take 1 tablet by mouth every 6 (six) hours as needed for moderate pain or severe pain. 30 tablet 0  . insulin aspart (NOVOLOG) 100 UNIT/ML injection Inject 0-20 Units into the skin 3 (three) times daily with meals. . CBG 70 - 120: 0 units CBG 121 - 150: 3 units CBG 151 - 200: 4 units CBG 201 - 250: 7 units CBG 251 - 300: 11 units CBG 301 - 350: 15 units CBG 351 - 400: 20 units    . insulin glargine (LANTUS) 100 UNIT/ML injection Inject 0.3 mLs (30 Units total) into the skin daily.    [name] lidocaine-prilocaine (EMLA) cream Apply to port site prior to being accessed    . lipase/protease/amylase (CREON) 36000 UNITS CPEP capsule 2 po prior to snacks; 4 po prior to meals    . metFORMIN (GLUCOPHAGE) 500 MG tablet Take 500 mg by mouth daily with breakfast.    . mirtazapine (REMERON) 15 MG tablet Take 1 tablet (15 mg total) by mouth at bedtime. 30 tablet 0  . ondansetron (ZOFRAN) 8 MG tablet Take 1 tablet (8 mg total) by mouth every 8 (eight) hours as needed for nausea or vomiting. 30 tablet 2  . ONETOUCH VERIO test strip USE AS INSTRUCTED UP TO 4 TIMES DAILY 200 each 11  . polyethylene glycol (MIRALAX / GLYCOLAX) packet Take 17 g by mouth daily. 14 each 0  . potassium chloride SA (K-DUR,KLOR-CON) 20 MEQ tablet Take 20 mEq by mouth daily.    . pregabalin (LYRICA) 75 MG capsule Take 1 capsule (75 mg total) by mouth 2 (two) times daily. 60 capsule 3  . rOPINIRole (REQUIP) 2 MG tablet Take 1 tablet (2 mg total) by mouth 2 (two) times daily. 60 tablet 0  . sertraline (ZOLOFT) 50 MG tablet Take 50 mg by mouth daily.    . silver sulfADIAZINE (SILVADENE) 1 % cream Apply 1 application  daily topically. 50 g 0  . spironolactone (ALDACTONE) 25 MG tablet Take 1 tablet (25 mg total) by mouth daily. 90 tablet 3  . torsemide (DEMADEX) 100 MG tablet Take 50 mg by mouth daily.    . traMADol (ULTRAM) 50 MG tablet Take 1 tablet (50 mg total) by mouth every 6 (six) hours as needed. (Patient taking differently: Take 50 mg every 6 (six) hours as needed by mouth for moderate pain. ) 60 tablet 0  . lidocaine (LIDODERM) 5 % Place 1 patch daily onto the skin. Remove & Discard patch within 12 hours or as directed by MD (Patient not taking: Reported on 07/12/2017) 30 patch 0  . mupirocin ointment (BACTROBAN) 2 % Apply 1 application topically daily. (Patient not taking: Reported on 07/12/2017) 90 g 0  . oxyCODONE (OXY IR/ROXICODONE) 5 MG immediate release tablet Take 1 tablet (5 mg total) by mouth every 6 (six) hours as needed for severe pain. 60 tablet 0   No current facility-administered medications for this visit.    Facility-Administered Medications Ordered in Other Visits  Medication Dose Route Frequency Provider Last Rate Last Dose  . sodium chloride flush (NS) 0.9 % injection 10 mL  10 mL Intracatheter PRN Truitt Merle, MD   10 mL at 02/22/17 1729    REVIEW OF SYSTEMS:   Constitutional: alert, no distress and comfortable (+) Low appetite (+) severe fatigue Eyes: Denies blurriness of vision, double  vision or watery eyes Ears, nose, mouth, throat, and face: Denies mucositis or sore throat Respiratory: Denies cough, dyspnea or wheezes Cardiovascular: Denies palpitation, chest discomfort or (+) chronic B/L extremity swelling, with pain and tenderness Gastrointestinal:  Negative  MSK: (+) back pain  Skin: Denies abnormal skin rashes (+) erythema of B/l LE Lymphatics: Denies new lymphadenopathy or easy bruising Neurological:(+) neuropathy, stable Behavioral/Psych: Mood is stable, no new changes  All other systems were reviewed with the patient and are negative.  PHYSICAL EXAMINATION:  ECOG  PERFORMANCE STATUS: 3 - Symptomatic, >50% confined to bed  Vitals:   07/12/17 1133  BP: (!) 123/56  Pulse: 68  Resp: 17  Temp: 99.5 F (37.5 C)  SpO2: 98%   Filed Weights   07/12/17 1133  Weight: 194 lb (88 kg)    GENERAL: Alert, sitting in wheelchair  SKIN: skin color, texture, turgor are normal, no rashes, (+) diffuse skin erythema B/L LE below knee and above ankle.  EYES: normal, conjunctiva are pink and non-injected, sclera clear OROPHARYNX:no exudate, no erythema and lips, buccal mucosa, and tongue normal  NECK: supple, thyroid normal size, non-tender, without nodularity LYMPH:  no palpable lymphadenopathy in the cervical, axillary or inguinal LUNGS: clear to auscultation and percussion with normal breathing effort HEART: regular rate & rhythm and no murmurs (+) 2+ pitting edema in B/L LE below knee ABDOMEN: tenderness to epigastrium, but overall abdomen is soft. No organomegaly, bowel sounds normal.  Musculoskeletal: no cyanosis of digits and no clubbing.  PSYCH: alert & oriented x 3 with fluent speech NEURO: no focal motor/sensory deficits   LABORATORY DATA:  I have reviewed the data as listed CBC Latest Ref Rng & Units 07/12/2017 06/20/2017 06/18/2017  WBC 3.9 - 10.3 K/uL 14.6(H) 8.5 7.5  Hemoglobin 11.6 - 15.9 g/dL 11.6 9.6(L) 10.3(L)  Hematocrit 34.8 - 46.6 % 36.2 31.8(L) 33.8(L)  Platelets 145 - 400 K/uL 318 256 281   CMP Latest Ref Rng & Units 07/12/2017 07/11/2017 06/20/2017  Glucose 70 - 140 mg/dL 183(H) 188(H) 132(H)  BUN 7 - 26 mg/dL 41(H) 39(H) 46(H)  Creatinine 0.60 - 1.10 mg/dL 1.35(H) 1.10(H) 1.34(H)  Sodium 136 - 145 mmol/L 139 135 140  Potassium 3.5 - 5.1 mmol/L 4.1 4.7 4.2  Chloride 98 - 109 mmol/L 99 96(L) 103  CO2 22 - 29 mmol/L _0 Calcium 8.4 - 10.4 mg/dL 9.4 9.0 8.7(L)  Total Protein 6.4 - 8.3 g/dL 7.5 - -  Total Bilirubin 0.2 - 1.2 mg/dL 2.5(H) - -  Alkaline Phos 40 - 150 U/L 821(H) - -  AST 5 - 34 U/L 81(H) - -  ALT 0 - 55 U/L 41 - -      CA 19-9:  03/17/16: 107 02/01/17: 13 03/01/17: 15 03/28/17: 18 04/26/17: 21 05/11/17: 18 05/30/17: 43 06/14/17: 58 07/12/17: PENDING    RADIOGRAPHIC STUDIES: I have personally reviewed the radiological images as listed and agreed with the findings in the report.   CT CAP 05/03/17 IMPRESSION: 1. Subjectively the pancreatic head and uncinate process mass appears similar to the prior exam. The lesion is associated with about 50% circumferential involvement of the SMA and SMV. No compelling findings of distant metastatic spread at this time. 2. Mild biliary dilatation extending down to the pancreatic head mass. Mild pneumobilia. 3. There is mildly increased infiltrative edema along the porta hepatis. 4. Other imaging findings of potential clinical significance: Prominent stool throughout the colon favors constipation. Aortic Atherosclerosis (ICD10-I70.0). Coronary atherosclerosis. Mitral valve  calcification. Mild cardiomegaly. Free osteochondral fragments in the right glenohumeral joint. Lower lumbar spondylosis and degenerative disc disease. 2 stable nonspecific hypodense splenic lesions. Emphysema (ICD10-J43.9).    CT CAP 02/20/17  IMPRESSION: 1. Response to therapy of the primary pancreatic head/uncinate process lesion. Please note that delineation of the tumor is difficult secondary to anatomic distortion from therapy. 2. No new or progressive disease identified. 3. Removal of biliary stent, with pneumobilia and borderline similar intrahepatic biliary duct dilatation. 4.  No acute process or evidence of metastatic disease in the chest. 5.  Possible constipation. 6. Coronary artery atherosclerosis. Aortic Atherosclerosis (ICD10-I70.0). 7.  Emphysema (ICD10-J43.9). 8. Pulmonary artery enlargement suggests pulmonary arterial hypertension.    CT Angio Chest PE W or WO Contrast, 12/08/2016 IMPRESSION: No evidence of pulmonary embolism. Thickening of the pulmonic valve ; if there is  clinical concern for valvular disease consider echocardiography. Mild dilatation of the main pulmonary artery which may reflect pulmonary artery hypertension. Atherosclerotic calcifications aorta and coronary arteries. Aortic Atherosclerosis (ICD10-I70.0).  CT Chest WO Contrast 05/07/16 Carlean Purl IMPRESSION: 1. New small right and trace left pleural effusions. Mild cardiomegaly. 2. Borderline enlarged right hilar lymph node at 1 cm in short axis. 3. No definite findings of metastatic disease to the chest. 4. Centrilobular emphysema. 5. High density in the visualized part of the dorsal pancreatic duct, probably contrast medium. 6. Subacute healing fracture of the right seventh rib laterally. 7. Atherosclerosis, including the left anterior descending coronary artery.  DG ERCP 05/06/16 Carlean Purl IMPRESSION: Imaging demonstrating removal of endoscopic common bile duct stent. The stent was not able to be replaced.   CT Abd, Head, Pelvis 05/05/16 IMPRESSION: Interval placement of a bile duct stent with some decompression of the bile ducts. Mild residual biliary dilatation. Pancreatic ductal dilatation and atrophy. Known pancreatic mass lesion is not well depicted at CT. No evidence of bowel obstruction or inflammation.   ASSESSMENT & PLAN: 72 y.o.female with PMH of IDDM with neuropathy, HFpEF, HTN, OSA on intermittent home oxygen, and recently diagnosed pancreatic mass s/p biliary stent 04/28/2016 who was recently treated for acute cholangitis, status post cutaneous biliary drainage. She had biliary drainage tube exchange with peri-stent placement by IR Dr. Laurence Ferrari today  1. Pancreatic adenocarcinoma, borderline resectable, [id number], stage IB --I previously reviewed her CT scan findings, her pancreatic cancer is probably resectable, no evidence of distant metastasis on CT scans. She was evaluated by general surgeon Dr. Barry Dienes. However she may not be a good candidate due to her advanced age,  comorbidities, malnutrition and a very poor performance status. -We previously reviewed the natural history of pancreatic cancer, and a very high risk of recurrence after surgical resection. Most people requires neoadjuvant or adjuvant chemotherapy and possible radiation -We previously have discussed her case in our she had tumor board. Dr. Barry Dienes did not think she was a candidate for whipple surgery. I recommended RT.  -She completed Pancreatic SBRT with Dr. Lisbeth Renshaw 07/11/16 - 07/21/16. -Patient previously received palliative chemo, did not tolerated well overall. -Due to her overall poor performance status, especially memory loss, I have stopped her chemotherapy.  She is on supportive care alone now. -Her genetic testing was negative, she is not a candidate for PARP inhibitor  -I changed her pain medication from hydrocodone to oxycodone 66m as needed q5-6 hours. She can use tramadol for moderate pain and she can use Tylenol for minimal pain.  -I advised her to watch for constipation. Continue prophylactic Colase. If not enough she can  use Miralax and titrate up as needed. If she does not have BM for 4-5 days she should take Milk of Magnesia or magnesium citrate.  -I discussed given she is no longer able to tolerate chemotherapy and is no longer receiving treatment, I recommend hospice home care to help her. She agrees, she has been under palliative care home service, and she likes the nurse Olivia Mackie very well, she would like to keep her if possible. I will send referral to switch her from palliative care to hospice care if she can continue under Tracy's care. -Reviewed, she has developed mild hyperbilirubinemia, possibly related to her pancreatic cancer, we discussed the signs of jaundice, she knows what to watch. -I will see her as needed   2. B/l LE cellulitis and edema  -She was given a course of Keflex in early Feb 2019, no significant improvement.  Doppler was negative for DVT -She is allergic to  sulfa, I previously prescribed her 7 days of clindamycin -I encouraged her to use compression stockings, elevate legs, and slight increase her dose of Lasix, to improve her leg edema, hopefully that would improve her cellulitis also -She completed 2 courses of antibiotics, keflex and clindamycin.  -Right leg erythema is stable, but not resolved. She has chronic venous stasis changes in both legs.  -I strongly encouraged her to elevate her legs at home and get compression socks.  -She is afebrile. Will monitor.   3. Diarrhea, possibly related to pancreatic insufficiency  -Ruled out C. Diff with lab. Her stool was loose but not watery, and c-diff PCR was not to perform.  No increased white count, less likely this is infectious. -Previously encouraged her to continue Creon -will treat patient diarrhea with lomotil Rx.  -I previously advised her to increase lomotil up to 6 tabs a day and to take her creon in the morning and then with every meal. With Imodium as needed -She has 2+ episodes diarrhea per day at baseline. Takes creon as instructed. She has not tried lomotil; I reviewed dosing instructions and indications for use to avoid dehydration and electrolyte imbalance. -She has lactulose at home to use for constipation -Diarrhea/constipation controlled.  -As she increased her pain medication I advised her to watch for constipation and reviewed use of Colase and Miralax    4. IDDM, morbid obesity -follow up with PCP -On metformin, lantus, and novolog.   5. Chronic diastolic heart failure -follow up with PCP -Was seen in cardiology f/u 2/20, increased torsemide to 100 mg x2 days then back to 50 mg daily and to repeat PRN for increased volume status. She was instructed to take 100 mg torsemide on chemo days with IV fluids. She is on fluid restriction to <2 L daily and salt restriction.  -Continue to f/u with cardiologist  6. Malnutrition and deconditioning -I previously encouraged her to  continue physical therapy at her rehabilitation -Appetite is increased on Remeron. Encouraged her to eat well and be active.  -Patient notes she does not want appetite stimulant. -I encouraged her to increase protein and increase ensure boost.   7. Chronic Venous Stasis  -Patient with negative doppler study on 03/29/2017 -Advised the patient to elevate her legs at home while resting to improve venous return.  -previously advised that the patient start using compression stockings to aid with BLE swelling -previously advised the patient to began taking 1 tablet of Lasix daily for 3-5 days and then proceed to 0.5 tablet as prescribed.  -previously advised that the  patient consume an increased amount of water due to the lasix prescription.  -Persists with 2+ pitting edema, continue Lasix at half tablet daily.   8. Goal of care discussion  -We again discussed the incurable nature of her cancer, and the overall poor prognosis, especially if she does not have good response to chemotherapy or progress on chemo -The patient understands the goal of care is palliative. -I previously recommended DNR/DNI, she will think about it   9. Peripheral neuropathy G1 -Likely secondary to DM. -on lyrica and 2400 mg Neurontin per PCP.  -Neuropathy stable while on gemcitabine. Vibratory sense to feet is moderately decreased. We reviewed if neuropathy worsens, may have to reduce or discontinue chemo.  -She can use tramadol or oxycodone for significant pain that impairs sleep PRN -Stable   10. Anemia  -Multifactorial, related to chemotherapy and possibly some degree of chronic disease.  -Will monitor closely -Hg normalized to 11.6 today (07/12/17)    PLAN: -[facility] referral, she will decide if she want to continue palliative home care versus switching to full hospice -Follow up as needed, she knows to call me if needed    No orders of the defined types were placed in this encounter.   All questions  were answered. The patient knows to call the clinic with any problems, questions or concerns. I spent 20 minutes counseling the patient face to face. The total time spent in the appointment was 25 minutes and more than 50% was on counseling.  This document serves as a record of services personally performed by Truitt Merle, MD. It was created on her behalf by Joslyn Devon, a trained medical scribe. The creation of this record is based on the scribe's personal observations and the provider's statements to them.    I have reviewed the above documentation for accuracy and completeness, and I agree with the above.     Truitt Merle, MD 07/12/2017

## 2017-07-11 NOTE — Patient Instructions (Signed)
Labs today We will only contact you if something comes back abnormal or we need to make some changes. Otherwise no news is good news!    Your physician recommends that you schedule a follow-up appointment in: 3 months with Dr McLean   Do the following things EVERYDAY: 1) Weigh yourself in the morning before breakfast. Write it down and keep it in a log. 2) Take your medicines as prescribed 3) Eat low salt foods-Limit salt (sodium) to 2000 mg per day.  4) Stay as active as you can everyday 5) Limit all fluids for the day to less than 2 liters   

## 2017-07-11 NOTE — Progress Notes (Signed)
Patient ID: Melissa Myers, female   DOB: [birth year], 72 y.o.   MRN: [id number]   PCP: Dr. Sharlet Salina Endocrinologist: Dr Loanne Drilling Cardiology: Dr Aundra Dubin.   Melissa Myers is a 72 y.o. female with history of HTN, DM, hyperlipidemia, OHS/OSA, and chronic dyspnea/diastolic CHF returns for followup of CHF.  Patient had an echo in 04/2009 showing moderate LVH and preserved LV systolic function. However, there was a very large LV mid-cavity gradient with valsalva.  Patient developed quite significant exertional dyspnea to the point where she was short of breath walking around her house. She had a pulmonary evaluation with Dr. Elsworth Soho but no primary lung problems were identified. Patient had an ETT-myoview in 05/2009 which was negative for ischemia or infarction. Right heart cath in 05/2009 showed mildly elevated right heart filling pressures but normal PA pressure and normal PCWP. She was started her on a beta blocker (Coreg) to try to lower her LV mid-cavity gradient (that likely occurs with exertion) and to better control BP.  V/Q scan was negative for PE.  PFTs from 06/2011 showed a restrictive defect. Last echo in 11/2013 showed EF 65% with normal RV size and systolic function.      Admitted 12/05/13 with volume overload. Diuresed with IV lasix and transitioned to torsemide 80 mg twice a day + metolazone. Overall she diuresed 21 pounds. Discharge weight was 263 pounds.   She was admitted in 9/16 with Strep agalactiae sepsis likely from cellulitis.    Admitted 12/1 though 01/26/15 with marked volume overload and cellulitis. Diuresed with Lasix drip and placed on antibiotics. Overall diuresed 20 pounds.   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  Most of leg pain is likely due to neuropathy.    She was diagnosed with pancreatic cancer in 2018.  Being followed at [facility] currently, definite decision +/- surgery has not yet been made.  Poor  functional status.  Memory is worse.   She was admitted 4/26-4/30/19 with back pain. Treated for UTI. No changes to HF medications. Echo 06/17/17: EF 65-70%, grade 2 DD. There was concern for PFO because of her murmur, but bubble study did not show any evidence. Refused SNF at [facility].   She presents today for regular HF follow up with her caregiver. Last visit, torsemide was increased for a few days and then continued at 50 mg daily. Overall she is doing okay. Weight is down 20 lbs since last visit in February. Her memory is still poor and most of history is obtained from her caregiver. She has finished chemo. Appetite is poor. Denies SOB with activity, but activity is limited due to pain. She is able to walk around the house with walker. Denies orthopnea. She has BLE edema. She is dizzy at times, but it is not related to activity or sitting to standing. Wearing oxygen as needed at home. Weights have been decreasing at home (12 lbs over the last several weeks). She was 196 lbs on her home scale this morning. Taking all medications. Now being followed by Hospice and [facility].   Labs (10/13): K 4.1, creatinine 0.85 Labs (4/14): K 3.7, creatinine 1.2, BUN 43, BNP 23 Labs (5/14): K 4.1, creatinine 1.0 Labs (7/14): K 4.6 Creatinine 1.0 BNP 39.0  Labs (8/14): K 3.5, creatinine 0.91, BNP 71 Labs (11/14): K 3.7, creatinine 0.9 Labs (12/14): K 4.1, creatinine 0.8, BNP 52 Labs (3/15): K 4, creatinine 0.9 Labs (4/15): K  3.7, creatinine 0.9 Labs (5/15): K 3.8, creatinine 0.9, BNP 29 Labs (6/15): K 3.4, BUN 105, creatinine 0.9 =>1.7 Labs (6/17/5) K 4.2, BUN 41, Creatinine 0.80 Labs (08/19/13) K 3.2, BUN 71, creatinine 1.5 Labs 09/05/13 K 4.1 Creatinine 1.1 Labs 11/07/13 K 3.6 Creatinine 0.91 Labs 12/11/13 K 4.5 Creatine 1.05 Labs (12/15): K 3.8, creatinine 1.03 Labs (03/17/2014): K 4.1 Creatinine 0.99  Labs (4/16): K 3.7, creatinine 1.24 Labs (5/16): LDL 108, TGs 699 Labs (07/24/2014) K 3.5  Creatinine 1.17  Labs (07/31/2014) K 3.3 Creatinine 1.13 Labs (9/16): K 3.5, creatinine 0.93 Labs (01/26/2015) K 3.6 Creatinine 1.36  Labs 04/22/15 K 3.8, Creatinine 1.66 Labs (4/17): K 4.6, creatinine 1.74, HCT 37.9 Labs (9/17): K 2.8, creatinine 1.67, glucose 505, BNP 20, hgb 12.4 Labs (11/19/2015) : K 3.2 Creatinine 2.25  Labs (11/27/2015): K 4.4 Creatinine 1.26  Labs (11/17): BNP 63 Labs (12/17): K 3.9, creatinine 1.49, hgbA1c 14.1 Labs (9/18): K 2.9, creatinine 1.08, hgb 8  Allergies (verified):  1) ! Sulfa  2) Morphine   Past Medical History:  1. Diabetes mellitus, type II  2. Hyperlipidemia: She has been unable to tolerate statins (has been on Vytorin, lovastatin, and Crestor) due to what sounds like rhabdo: developed muscle weakness and was told she had "muscle damage" with each of these statins. She has been told not to take statins anymore.  Has hypertriglyceridemia.  3. Hypertension 4. Obesity  5. Restless Leg Syndrome  6. OA  7. Diabetic peripheral neuropathy  8. OHS/OSA: Uses supplemental oxygen, does not tolerate CPAP.   9. Chronic nausea/diarrhea: ? IBS  10. Diastolic CHF: Echo (3/32) with normal LV size, moderate LVH, EF 65-70%, LV mid-cavity gradient reaching 63 mmHg with valsalva but minimal at rest, grade I diastolic dysfunction, cannot estimate PA systolic pressure (no TR doppler signal), RV normal. RHC (4/11): Mean RA 11, RV 37/13, PA 33/15, mean PCWP 12, CI 3.3. Echo in 11/12 showed mild LVH, EF 65-70%, no LV mid-cavity gradient was mentioned.  Echo in 4/14 showed EF 65-70%, mild LVH, grade I diastolic dysfunction, PA systolic pressure 35 mmHg, mild LV mid-cavity gradient.  Echo (5/15) with EF 60-65%, mild TR, normal RV size and systolic function. Echo (10/15) with EF 65%, moderate LVH, normal RV.  - ECHO 11/30/15 LVEF 65-70%, Grade 1 DD.  11. ETT-myoview (4/11): 5'30", stopped due to fatigue, normal EF, no evidence for scar or ischemia. 12. V/Q scan 11/12: negative for  PE.  Lower extremity venous doppler US (3/14): negative for DVT.  13. PFTs (5/13): FVC 50%, FEV1 62%, ratio 106%, DLCO 69%, TLC 69%.  14. Pancreatic adenocarcinoma: Found in 2018.   Family History:  (Reviewed, No new) Family History of Alcoholism/Addiction (parent)  Family History Diabetes 1st degree relative (grandparent)  Family History High cholesterol (parent, grandparent)  Family History Hypertension (parent, grandparent)  Family History Lung cancer (grandparent)  Stomach cancer (grandmother)  Celiac Sprue daughter  Mother with MI at 51, father with MI at 74, uncle with MI at 71, brother with stents in his 30s, multiple aunts with MIs and CVAs   Social History: (Reviewed, No new) Never Smoked  no alcohol  married, lives with spouse and her mother  retired Network engineer Alcohol Use - no  Illicit Drug Use - no   Review of systems complete and found to be negative unless listed in HPI.    Current Outpatient Medications  Medication Sig Dispense Refill  . aspirin EC 81 MG tablet Take 1 tablet (81 mg total)  by mouth daily.    . baclofen (LIORESAL) 10 MG tablet Take 0.5 tablets (5 mg total) by mouth 3 (three) times daily. (Patient taking differently: Take 10 mg by mouth 3 (three) times daily. ) 30 each 0  . carvedilol (COREG) 25 MG tablet TAKE ONE TABLET TWICE DAILY WITH FOOD 60 tablet 5  . Cholecalciferol (VITAMIN D3) 2000 units capsule Take 1 capsule (2,000 Units total) by mouth daily. 90 capsule 3  . Continuous Blood Gluc Sensor (FREESTYLE LIBRE SENSOR SYSTEM) MISC Use to check sugars 1 each 0  . docusate sodium (COLACE) 100 MG capsule Take 1 capsule (100 mg total) by mouth 2 (two) times daily. 30 capsule 2  . HYDROcodone-acetaminophen (NORCO/VICODIN) 5-325 MG tablet Take 1 tablet by mouth every 6 (six) hours as needed for moderate pain or severe pain. 30 tablet 0  . insulin aspart (NOVOLOG) 100 UNIT/ML injection Inject 0-20 Units into the skin 3 (three) times daily with meals. . CBG  70 - 120: 0 units CBG 121 - 150: 3 units CBG 151 - 200: 4 units CBG 201 - 250: 7 units CBG 251 - 300: 11 units CBG 301 - 350: 15 units CBG 351 - 400: 20 units    . insulin glargine (LANTUS) 100 UNIT/ML injection Inject 0.3 mLs (30 Units total) into the skin daily.    [name] lidocaine (LIDODERM) 5 % Place 1 patch daily onto the skin. Remove & Discard patch within 12 hours or as directed by MD 30 patch 0  . lidocaine-prilocaine (EMLA) cream Apply to port site prior to being accessed    . lipase/protease/amylase (CREON) 36000 UNITS CPEP capsule 2 po prior to snacks; 4 po prior to meals    . metFORMIN (GLUCOPHAGE) 500 MG tablet Take 500 mg by mouth daily with breakfast.    . mirtazapine (REMERON) 15 MG tablet Take 1 tablet (15 mg total) by mouth at bedtime. 30 tablet 0  . mupirocin ointment (BACTROBAN) 2 % Apply 1 application topically daily. 90 g 0  . ondansetron (ZOFRAN) 8 MG tablet Take 1 tablet (8 mg total) by mouth every 8 (eight) hours as needed for nausea or vomiting. 30 tablet 2  . ONETOUCH VERIO test strip USE AS INSTRUCTED UP TO 4 TIMES DAILY 200 each 11  . polyethylene glycol (MIRALAX / GLYCOLAX) packet Take 17 g by mouth daily. 14 each 0  . potassium chloride SA (K-DUR,KLOR-CON) 20 MEQ tablet Take 20 mEq by mouth daily.    . pregabalin (LYRICA) 75 MG capsule Take 1 capsule (75 mg total) by mouth 2 (two) times daily. 60 capsule 3  . rOPINIRole (REQUIP) 2 MG tablet Take 1 tablet (2 mg total) by mouth 2 (two) times daily. 60 tablet 0  . sertraline (ZOLOFT) 50 MG tablet Take 50 mg by mouth daily.    . silver sulfADIAZINE (SILVADENE) 1 % cream Apply 1 application daily topically. 50 g 0  . spironolactone (ALDACTONE) 25 MG tablet Take 1 tablet (25 mg total) by mouth daily. 90 tablet 3  . traMADol (ULTRAM) 50 MG tablet Take 1 tablet (50 mg total) by mouth every 6 (six) hours as needed. (Patient taking differently: Take 50 mg every 6 (six) hours as needed by mouth for moderate pain. ) 60 tablet 0     No current facility-administered medications for this encounter.    Facility-Administered Medications Ordered in Other Encounters  Medication Dose Route Frequency Provider Last Rate Last Dose  . sodium chloride flush (NS) 0.9 % injection  10 mL  10 mL Intracatheter PRN Truitt Merle, MD   10 mL at 02/22/17 1729   Vitals:   07/11/17 1546  BP: 124/60  Pulse: 82  SpO2: 98%  Weight: 194 lb 4 oz (88.1 kg)   Wt Readings from Last 3 Encounters:  07/11/17 194 lb 4 oz (88.1 kg)  06/29/17 205 lb (93 kg)  06/17/17 195 lb 12.3 oz (88.8 kg)    General: Elderly appearing. No resp difficulty. Arrived in wheelchair.  HEENT: Normal Neck: Supple. JVP 7-8. Carotids 2+ bilat; no bruits. No thyromegaly or nodule noted. Cor: PMI nondisplaced. RRR, 3/6 systolic murmur along sternal border Lungs: CTAB, normal effort.  Abdomen: Soft, non-tender, non-distended, no HSM. No bruits or masses. +BS  Extremities: No cyanosis, clubbing, or rash. BLE 1+ edema Neuro: Alert & orientedx3, cranial nerves grossly intact. moves all 4 extremities w/o difficulty. Affect pleasant   Assessment/Plan: 1. Chronic diastolic CHF:  Echo 6/83/41: EF 65-70%, grade 2 DD - NYHA III, confounded by inactivity due to pain - Volume status stable.  - Continue torsemide 50 mg daily. - Continue spiro 25 mg daily - Reinforced fluid restriction to < 2 L daily, sodium restriction to less than 2000 mg daily, and the importance of daily weights.    2. OHS:  Followed by Dr Elsworth Soho, has previously stated no role for CPAP.  - No change.   3. HTN:  - Stable today    4. PAD: Bilateral iliac disease on aortoiliac dopplers in 7/17.   - Plan for medical management per Dr. Fletcher Anon - Continue Zetia, cannot take statins. No change  5. Murmur: Has had vigorous LV contraction with LV mid-cavity gradient in the past. - Recent bubble study negative for PFO  6. Pancreatic cancer:  - Follows with [facility] - No plans for surgery, inoperable tumor. She is  done with chemo. Sees oncologist tomorrow.  - [facility]  7. Chest pain:  - CT negative for PE. Denies CP.   8. RLE Cellulitis - Resolved  BMET today Follow up in 3 months  Georgiana Shore, NP  07/11/2017   Greater than 50% of the 25 minute visit was spent in counseling/coordination of care regarding disease state education, salt/fluid restriction, sliding scale diuretics, and medication compliance.

## 2017-07-12 ENCOUNTER — Inpatient Hospital Stay: Payer: PPO | Admitting: Hematology

## 2017-07-12 ENCOUNTER — Encounter: Payer: Self-pay | Admitting: Hematology

## 2017-07-12 ENCOUNTER — Telehealth: Payer: Self-pay | Admitting: *Deleted

## 2017-07-12 ENCOUNTER — Telehealth: Payer: Self-pay | Admitting: Hematology

## 2017-07-12 ENCOUNTER — Inpatient Hospital Stay: Payer: PPO

## 2017-07-12 VITALS — BP 123/56 | HR 68 | Temp 99.5°F | Resp 17 | Ht 60.0 in | Wt 194.0 lb

## 2017-07-12 DIAGNOSIS — Z800 Family history of malignant neoplasm of digestive organs: Secondary | ICD-10-CM | POA: Diagnosis not present

## 2017-07-12 DIAGNOSIS — C250 Malignant neoplasm of head of pancreas: Secondary | ICD-10-CM

## 2017-07-12 DIAGNOSIS — Z7401 Bed confinement status: Secondary | ICD-10-CM | POA: Diagnosis not present

## 2017-07-12 DIAGNOSIS — M48061 Spinal stenosis, lumbar region without neurogenic claudication: Secondary | ICD-10-CM | POA: Diagnosis not present

## 2017-07-12 DIAGNOSIS — IMO0001 Reserved for inherently not codable concepts without codable children: Secondary | ICD-10-CM

## 2017-07-12 DIAGNOSIS — K589 Irritable bowel syndrome without diarrhea: Secondary | ICD-10-CM | POA: Diagnosis not present

## 2017-07-12 DIAGNOSIS — Z8041 Family history of malignant neoplasm of ovary: Secondary | ICD-10-CM | POA: Diagnosis not present

## 2017-07-12 DIAGNOSIS — I5032 Chronic diastolic (congestive) heart failure: Secondary | ICD-10-CM

## 2017-07-12 DIAGNOSIS — Z794 Long term (current) use of insulin: Secondary | ICD-10-CM | POA: Diagnosis not present

## 2017-07-12 DIAGNOSIS — F329 Major depressive disorder, single episode, unspecified: Secondary | ICD-10-CM | POA: Diagnosis not present

## 2017-07-12 DIAGNOSIS — E1151 Type 2 diabetes mellitus with diabetic peripheral angiopathy without gangrene: Secondary | ICD-10-CM | POA: Diagnosis not present

## 2017-07-12 DIAGNOSIS — M5414 Radiculopathy, thoracic region: Secondary | ICD-10-CM | POA: Diagnosis not present

## 2017-07-12 DIAGNOSIS — E119 Type 2 diabetes mellitus without complications: Secondary | ICD-10-CM

## 2017-07-12 DIAGNOSIS — E785 Hyperlipidemia, unspecified: Secondary | ICD-10-CM | POA: Diagnosis not present

## 2017-07-12 DIAGNOSIS — G2581 Restless legs syndrome: Secondary | ICD-10-CM | POA: Diagnosis not present

## 2017-07-12 DIAGNOSIS — I872 Venous insufficiency (chronic) (peripheral): Secondary | ICD-10-CM | POA: Diagnosis not present

## 2017-07-12 DIAGNOSIS — H9192 Unspecified hearing loss, left ear: Secondary | ICD-10-CM | POA: Diagnosis not present

## 2017-07-12 DIAGNOSIS — R4020 Unspecified coma: Secondary | ICD-10-CM | POA: Diagnosis not present

## 2017-07-12 DIAGNOSIS — Z8249 Family history of ischemic heart disease and other diseases of the circulatory system: Secondary | ICD-10-CM | POA: Diagnosis not present

## 2017-07-12 DIAGNOSIS — S3993XA Unspecified injury of pelvis, initial encounter: Secondary | ICD-10-CM | POA: Diagnosis not present

## 2017-07-12 DIAGNOSIS — Z515 Encounter for palliative care: Secondary | ICD-10-CM | POA: Diagnosis not present

## 2017-07-12 DIAGNOSIS — M2550 Pain in unspecified joint: Secondary | ICD-10-CM | POA: Diagnosis not present

## 2017-07-12 DIAGNOSIS — I10 Essential (primary) hypertension: Secondary | ICD-10-CM

## 2017-07-12 DIAGNOSIS — G4733 Obstructive sleep apnea (adult) (pediatric): Secondary | ICD-10-CM | POA: Diagnosis not present

## 2017-07-12 DIAGNOSIS — M25552 Pain in left hip: Secondary | ICD-10-CM | POA: Diagnosis not present

## 2017-07-12 DIAGNOSIS — G8929 Other chronic pain: Secondary | ICD-10-CM | POA: Diagnosis not present

## 2017-07-12 DIAGNOSIS — Z803 Family history of malignant neoplasm of breast: Secondary | ICD-10-CM | POA: Diagnosis not present

## 2017-07-12 DIAGNOSIS — K219 Gastro-esophageal reflux disease without esophagitis: Secondary | ICD-10-CM | POA: Diagnosis not present

## 2017-07-12 DIAGNOSIS — Z66 Do not resuscitate: Secondary | ICD-10-CM | POA: Diagnosis not present

## 2017-07-12 DIAGNOSIS — G92 Toxic encephalopathy: Secondary | ICD-10-CM | POA: Diagnosis not present

## 2017-07-12 DIAGNOSIS — R52 Pain, unspecified: Secondary | ICD-10-CM | POA: Diagnosis not present

## 2017-07-12 DIAGNOSIS — M545 Low back pain: Secondary | ICD-10-CM | POA: Diagnosis not present

## 2017-07-12 DIAGNOSIS — R509 Fever, unspecified: Secondary | ICD-10-CM | POA: Diagnosis not present

## 2017-07-12 DIAGNOSIS — Z801 Family history of malignant neoplasm of trachea, bronchus and lung: Secondary | ICD-10-CM | POA: Diagnosis not present

## 2017-07-12 DIAGNOSIS — I110 Hypertensive heart disease with heart failure: Secondary | ICD-10-CM | POA: Diagnosis not present

## 2017-07-12 LAB — CBC WITH DIFFERENTIAL/PLATELET
BASOS PCT: 1 %
Basophils Absolute: 0.1 10*3/uL (ref 0.0–0.1)
Eosinophils Absolute: 0 10*3/uL (ref 0.0–0.5)
Eosinophils Relative: 0 %
HEMATOCRIT: 36.2 % (ref 34.8–46.6)
HEMOGLOBIN: 11.6 g/dL (ref 11.6–15.9)
LYMPHS PCT: 9 %
Lymphs Abs: 1.3 10*3/uL (ref 0.9–3.3)
MCH: 25.2 pg (ref 25.1–34.0)
MCHC: 32 g/dL (ref 31.5–36.0)
MCV: 78.8 fL — ABNORMAL LOW (ref 79.5–101.0)
MONOS PCT: 7 %
Monocytes Absolute: 1 10*3/uL — ABNORMAL HIGH (ref 0.1–0.9)
NEUTROS ABS: 12.2 10*3/uL — AB (ref 1.5–6.5)
Neutrophils Relative %: 83 %
Platelets: 318 10*3/uL (ref 145–400)
RBC: 4.59 MIL/uL (ref 3.70–5.45)
RDW: 16.8 % — ABNORMAL HIGH (ref 11.2–14.5)
WBC: 14.6 10*3/uL — ABNORMAL HIGH (ref 3.9–10.3)

## 2017-07-12 LAB — COMPREHENSIVE METABOLIC PANEL
ALBUMIN: 2.7 g/dL — AB (ref 3.5–5.0)
ALT: 41 U/L (ref 0–55)
ANION GAP: 13 — AB (ref 3–11)
AST: 81 U/L — ABNORMAL HIGH (ref 5–34)
Alkaline Phosphatase: 821 U/L — ABNORMAL HIGH (ref 40–150)
BUN: 41 mg/dL — ABNORMAL HIGH (ref 7–26)
CALCIUM: 9.4 mg/dL (ref 8.4–10.4)
CO2: 27 mmol/L (ref 22–29)
Chloride: 99 mmol/L (ref 98–109)
Creatinine, Ser: 1.35 mg/dL — ABNORMAL HIGH (ref 0.60–1.10)
GFR calc non Af Amer: 38 mL/min — ABNORMAL LOW (ref >60)
GFR, EST AFRICAN AMERICAN: 44 mL/min — AB (ref >60)
Glucose, Bld: 183 mg/dL — ABNORMAL HIGH (ref 70–140)
POTASSIUM: 4.1 mmol/L (ref 3.5–5.1)
SODIUM: 139 mmol/L (ref 136–145)
Total Bilirubin: 2.5 mg/dL — ABNORMAL HIGH (ref 0.2–1.2)
Total Protein: 7.5 g/dL (ref 6.4–8.3)

## 2017-07-12 MED ORDER — OXYCODONE HCL 5 MG PO TABS: 5.0000 mg | ORAL_TABLET | Freq: Four times a day (QID) | ORAL | 0 refills | Status: DC | PRN

## 2017-07-12 NOTE — Telephone Encounter (Signed)
No 5/22 los.  

## 2017-07-12 NOTE — Telephone Encounter (Signed)
Spoke with Manus Gunning at [facility]  Informing her that pt is considering hospice services.  Pt currently has palliative care services , and would like to request the same nurse to assist pt with hospice services.   Per Manus Gunning, the nurse Marzetta Board works only on palliative area, not hospice services.   Spoke with pt's caregiver at home, and informed her of same above info.   Encouraged pt and family to discuss situation with nurse Stacy at her next home visit.   Asked caregiver to call office and let Dr. Burr Medico know what pt decides about hospice referral - whether with [facility] in [place]  , or with  [facility]  In [place].

## 2017-07-13 ENCOUNTER — Ambulatory Visit: Payer: Self-pay | Admitting: *Deleted

## 2017-07-13 ENCOUNTER — Encounter: Payer: Self-pay | Admitting: Internal Medicine

## 2017-07-13 ENCOUNTER — Ambulatory Visit (INDEPENDENT_AMBULATORY_CARE_PROVIDER_SITE_OTHER): Payer: PPO | Admitting: Internal Medicine

## 2017-07-13 ENCOUNTER — Telehealth: Payer: Self-pay | Admitting: Internal Medicine

## 2017-07-13 ENCOUNTER — Encounter: Payer: Self-pay | Admitting: *Deleted

## 2017-07-13 VITALS — BP 122/68 | HR 61 | Temp 98.6°F | Ht 60.0 in | Wt 199.0 lb

## 2017-07-13 DIAGNOSIS — C250 Malignant neoplasm of head of pancreas: Secondary | ICD-10-CM

## 2017-07-13 DIAGNOSIS — I10 Essential (primary) hypertension: Secondary | ICD-10-CM

## 2017-07-13 LAB — CANCER ANTIGEN 19-9: CA 19-9: 157 U/mL — ABNORMAL HIGH (ref 0–35)

## 2017-07-13 NOTE — Patient Instructions (Signed)
We will recheck the liver numbers next week to see if the yellow levels are going up or are stable.

## 2017-07-13 NOTE — Telephone Encounter (Signed)
Called hospice and labs faxed to hospice as requested

## 2017-07-13 NOTE — Telephone Encounter (Signed)
What is C/S? Also it does not sound like there are any symptoms of urinary infection so why is U/A requested? Can they provide current insulin dosing as it sounds like she needs adjustment of this.

## 2017-07-13 NOTE — Telephone Encounter (Signed)
Please call order for CMP for Tuesday to home health fax results to [place]. Dx pancreatic cancer.

## 2017-07-13 NOTE — Telephone Encounter (Signed)
This encounter was created in error - please disregard.

## 2017-07-13 NOTE — Telephone Encounter (Signed)
Patient's son called and asked to make an appointment for her today to be seen, appointment scheduled for 1320 with Dr. Sharlet Salina. He says she is yellow like she has been in the past and the caregivers says her urine is orange colored.

## 2017-07-13 NOTE — Progress Notes (Signed)
   Subjective:    Patient ID: Melissa Myers, female    DOB: 30-Aug-1945, 72 y.o.   MRN: [id number]  HPI The patient is a 72 YO female coming in for yellow skin, change in urine color and lack of appetite. She does have pancreatic cancer and is s/p RT and chemo. The chemo was stopped due to poor tolerance and poor health even prior to cancer. She was not a candidate for surgery. She just saw oncology yesterday and had labs with them. She has not gotten results yet. Her husband is with her as well as a caretaker. She is feeling okay. Some pain in her legs today. Swelling is down overall although weight is up about 10 pounds at home. Sugars are running high but not eating much.   Review of Systems  Constitutional: Positive for activity change, appetite change and fatigue. Negative for chills, fever and unexpected weight change.  Respiratory: Negative.   Cardiovascular: Negative.   Gastrointestinal: Negative.   Musculoskeletal: Positive for arthralgias, back pain and myalgias. Negative for gait problem and joint swelling.  Skin: Positive for color change.  Neurological: Negative.       Objective:   Physical Exam  Constitutional: She is oriented to person, place, and time. She appears well-developed and well-nourished.  HENT:  Head: Normocephalic and atraumatic.  Eyes: EOM are normal.  Neck: Normal range of motion.  Cardiovascular: Normal rate and regular rhythm.  Pulmonary/Chest: Effort normal and breath sounds normal. No respiratory distress. She has no wheezes. She has no rales.  Abdominal: Soft. Bowel sounds are normal. She exhibits no distension. There is no tenderness. There is no rebound.  Musculoskeletal: She exhibits edema.  Stable edema to mildly improved in her legs.   Neurological: She is alert and oriented to person, place, and time. Coordination normal.  Skin: Skin is warm and dry.  Appears slightly jaundiced   Vitals:   07/13/17 1340  BP: 122/68  Pulse: 61  Temp:  98.6 F (37 C)  TempSrc: Oral  SpO2: 96%  Weight: 199 lb (90.3 kg)  Height: 5' (1.524 m)      Assessment & Plan:  Visit time 25 minutes: greater than 50% of that time was spent in counseling and coordination of care with the patient: counseled about likely progression of her pancreatic cancer and likely occlusion of bile duct and treatment options as well as more comfort measures.

## 2017-07-13 NOTE — Telephone Encounter (Signed)
Called [facility] patient is taking 30 units of Lantus and is on a sliding scale of the novolog  70-120=0 units 121-150=3 units 151-200=4 units 201-250=7 units 251-300=11 units 301-350=15 units 351-400=20 units

## 2017-07-13 NOTE — Assessment & Plan Note (Signed)
She likely does have progression of cancer given higher Ca-19-9 levels and increasing bilirubin levels from last month. She is not a candidate for surgery or more chemo. They are willing to do hospice so referral done today. She is unsure if she would want to pursue additional stenting if needed. Will ask home health to draw CMP next Tuesday to see if there is progression of bilirubin.

## 2017-07-13 NOTE — Telephone Encounter (Signed)
'  Kristine Garbe', nurse from [facility] called to report pt has had weight gain of 10 lbs since yesterday. States lungs sound clear bilaterally, no SOB. BLE edema but states non- pitting. Also reports BS this am >600 prior to insulin. After medications 447. States has been in upper 300's last 2 days. LGT yesterday 99.5, 98.5 this am, states other VSS. Pt denies any dysuria, pt voiding.  Also reports poor appetite last several days. Nurse states very difficult to get pt to practice. Requesting order for U/A, C/S and any other orders PCP would like faxed to office.   Nurses([facility]) cell # [phone]. Please advise.

## 2017-07-13 NOTE — Telephone Encounter (Signed)
Amy with [facility] called back to advise pt is already set up with [facility].  Amy got a call today and was confused. So she will disregard [phone]

## 2017-07-14 ENCOUNTER — Other Ambulatory Visit: Payer: Self-pay

## 2017-07-14 ENCOUNTER — Inpatient Hospital Stay (HOSPITAL_COMMUNITY)
Admission: EM | Admit: 2017-07-14 | Discharge: 2017-07-18 | DRG: 551 | Disposition: A | Discharge status: Hospice | Payer: PPO | Attending: Internal Medicine | Admitting: Internal Medicine

## 2017-07-14 ENCOUNTER — Telehealth: Payer: Self-pay

## 2017-07-14 ENCOUNTER — Encounter (HOSPITAL_COMMUNITY): Payer: Self-pay | Admitting: Emergency Medicine

## 2017-07-14 ENCOUNTER — Observation Stay (HOSPITAL_COMMUNITY): Payer: PPO

## 2017-07-14 DIAGNOSIS — Z9221 Personal history of antineoplastic chemotherapy: Secondary | ICD-10-CM

## 2017-07-14 DIAGNOSIS — Z882 Allergy status to sulfonamides status: Secondary | ICD-10-CM

## 2017-07-14 DIAGNOSIS — IMO0001 Reserved for inherently not codable concepts without codable children: Secondary | ICD-10-CM

## 2017-07-14 DIAGNOSIS — G2581 Restless legs syndrome: Secondary | ICD-10-CM | POA: Diagnosis present

## 2017-07-14 DIAGNOSIS — K589 Irritable bowel syndrome without diarrhea: Secondary | ICD-10-CM | POA: Diagnosis present

## 2017-07-14 DIAGNOSIS — M25552 Pain in left hip: Secondary | ICD-10-CM | POA: Diagnosis not present

## 2017-07-14 DIAGNOSIS — G92 Toxic encephalopathy: Secondary | ICD-10-CM | POA: Diagnosis present

## 2017-07-14 DIAGNOSIS — Z885 Allergy status to narcotic agent status: Secondary | ICD-10-CM

## 2017-07-14 DIAGNOSIS — Z888 Allergy status to other drugs, medicaments and biological substances status: Secondary | ICD-10-CM

## 2017-07-14 DIAGNOSIS — R52 Pain, unspecified: Secondary | ICD-10-CM | POA: Diagnosis not present

## 2017-07-14 DIAGNOSIS — R509 Fever, unspecified: Secondary | ICD-10-CM

## 2017-07-14 DIAGNOSIS — Z9981 Dependence on supplemental oxygen: Secondary | ICD-10-CM

## 2017-07-14 DIAGNOSIS — Z515 Encounter for palliative care: Secondary | ICD-10-CM | POA: Diagnosis present

## 2017-07-14 DIAGNOSIS — C250 Malignant neoplasm of head of pancreas: Secondary | ICD-10-CM

## 2017-07-14 DIAGNOSIS — Z66 Do not resuscitate: Secondary | ICD-10-CM | POA: Diagnosis present

## 2017-07-14 DIAGNOSIS — Z8041 Family history of malignant neoplasm of ovary: Secondary | ICD-10-CM

## 2017-07-14 DIAGNOSIS — Z7982 Long term (current) use of aspirin: Secondary | ICD-10-CM

## 2017-07-14 DIAGNOSIS — Z6836 Body mass index (BMI) 36.0-36.9, adult: Secondary | ICD-10-CM

## 2017-07-14 DIAGNOSIS — Z9851 Tubal ligation status: Secondary | ICD-10-CM

## 2017-07-14 DIAGNOSIS — E119 Type 2 diabetes mellitus without complications: Secondary | ICD-10-CM

## 2017-07-14 DIAGNOSIS — I110 Hypertensive heart disease with heart failure: Secondary | ICD-10-CM | POA: Diagnosis present

## 2017-07-14 DIAGNOSIS — Z800 Family history of malignant neoplasm of digestive organs: Secondary | ICD-10-CM

## 2017-07-14 DIAGNOSIS — F322 Major depressive disorder, single episode, severe without psychotic features: Secondary | ICD-10-CM | POA: Diagnosis present

## 2017-07-14 DIAGNOSIS — F329 Major depressive disorder, single episode, unspecified: Secondary | ICD-10-CM | POA: Diagnosis present

## 2017-07-14 DIAGNOSIS — I10 Essential (primary) hypertension: Secondary | ICD-10-CM | POA: Diagnosis present

## 2017-07-14 DIAGNOSIS — Z794 Long term (current) use of insulin: Secondary | ICD-10-CM

## 2017-07-14 DIAGNOSIS — M48061 Spinal stenosis, lumbar region without neurogenic claudication: Secondary | ICD-10-CM | POA: Diagnosis present

## 2017-07-14 DIAGNOSIS — M5414 Radiculopathy, thoracic region: Principal | ICD-10-CM | POA: Diagnosis present

## 2017-07-14 DIAGNOSIS — Z9089 Acquired absence of other organs: Secondary | ICD-10-CM

## 2017-07-14 DIAGNOSIS — I5032 Chronic diastolic (congestive) heart failure: Secondary | ICD-10-CM | POA: Diagnosis present

## 2017-07-14 DIAGNOSIS — M545 Low back pain, unspecified: Secondary | ICD-10-CM

## 2017-07-14 DIAGNOSIS — H9192 Unspecified hearing loss, left ear: Secondary | ICD-10-CM | POA: Diagnosis present

## 2017-07-14 DIAGNOSIS — Z8249 Family history of ischemic heart disease and other diseases of the circulatory system: Secondary | ICD-10-CM

## 2017-07-14 DIAGNOSIS — G9340 Encephalopathy, unspecified: Secondary | ICD-10-CM | POA: Diagnosis present

## 2017-07-14 DIAGNOSIS — I872 Venous insufficiency (chronic) (peripheral): Secondary | ICD-10-CM | POA: Diagnosis present

## 2017-07-14 DIAGNOSIS — Z803 Family history of malignant neoplasm of breast: Secondary | ICD-10-CM

## 2017-07-14 DIAGNOSIS — Z923 Personal history of irradiation: Secondary | ICD-10-CM

## 2017-07-14 DIAGNOSIS — G4733 Obstructive sleep apnea (adult) (pediatric): Secondary | ICD-10-CM | POA: Diagnosis present

## 2017-07-14 DIAGNOSIS — E1151 Type 2 diabetes mellitus with diabetic peripheral angiopathy without gangrene: Secondary | ICD-10-CM | POA: Diagnosis present

## 2017-07-14 DIAGNOSIS — E559 Vitamin D deficiency, unspecified: Secondary | ICD-10-CM | POA: Diagnosis present

## 2017-07-14 DIAGNOSIS — K219 Gastro-esophageal reflux disease without esophagitis: Secondary | ICD-10-CM | POA: Diagnosis present

## 2017-07-14 DIAGNOSIS — Z9049 Acquired absence of other specified parts of digestive tract: Secondary | ICD-10-CM

## 2017-07-14 DIAGNOSIS — Z801 Family history of malignant neoplasm of trachea, bronchus and lung: Secondary | ICD-10-CM

## 2017-07-14 DIAGNOSIS — G8929 Other chronic pain: Secondary | ICD-10-CM | POA: Diagnosis present

## 2017-07-14 DIAGNOSIS — E785 Hyperlipidemia, unspecified: Secondary | ICD-10-CM | POA: Diagnosis present

## 2017-07-14 DIAGNOSIS — S3993XA Unspecified injury of pelvis, initial encounter: Secondary | ICD-10-CM | POA: Diagnosis not present

## 2017-07-14 LAB — CBC WITH DIFFERENTIAL/PLATELET
Basophils Absolute: 0 10*3/uL (ref 0.0–0.1)
Basophils Relative: 0 %
Eosinophils Absolute: 0.1 10*3/uL (ref 0.0–0.7)
Eosinophils Relative: 0 %
HCT: 34.4 % — ABNORMAL LOW (ref 36.0–46.0)
HEMOGLOBIN: 11.1 g/dL — AB (ref 12.0–15.0)
LYMPHS ABS: 1.8 10*3/uL (ref 0.7–4.0)
LYMPHS PCT: 15 %
MCH: 25.6 pg — AB (ref 26.0–34.0)
MCHC: 32.3 g/dL (ref 30.0–36.0)
MCV: 79.4 fL (ref 78.0–100.0)
MONOS PCT: 8 %
Monocytes Absolute: 0.9 10*3/uL (ref 0.1–1.0)
NEUTROS PCT: 77 %
Neutro Abs: 8.8 10*3/uL — ABNORMAL HIGH (ref 1.7–7.7)
Platelets: 342 10*3/uL (ref 150–400)
RBC: 4.33 MIL/uL (ref 3.87–5.11)
RDW: 16.2 % — ABNORMAL HIGH (ref 11.5–15.5)
WBC: 11.5 10*3/uL — AB (ref 4.0–10.5)

## 2017-07-14 LAB — COMPREHENSIVE METABOLIC PANEL
ALBUMIN: 2.6 g/dL — AB (ref 3.5–5.0)
ALK PHOS: 563 U/L — AB (ref 38–126)
ALT: 27 U/L (ref 14–54)
AST: 43 U/L — ABNORMAL HIGH (ref 15–41)
Anion gap: 14 (ref 5–15)
BILIRUBIN TOTAL: 4.6 mg/dL — AB (ref 0.3–1.2)
BUN: 37 mg/dL — AB (ref 6–20)
CHLORIDE: 98 mmol/L — AB (ref 101–111)
CO2: 27 mmol/L (ref 22–32)
Calcium: 8.9 mg/dL (ref 8.9–10.3)
Creatinine, Ser: 0.98 mg/dL (ref 0.44–1.00)
GFR calc non Af Amer: 56 mL/min — ABNORMAL LOW (ref >60)
Glucose, Bld: 138 mg/dL — ABNORMAL HIGH (ref 65–99)
POTASSIUM: 3.6 mmol/L (ref 3.5–5.1)
SODIUM: 139 mmol/L (ref 135–145)
Total Protein: 7.1 g/dL (ref 6.5–8.1)

## 2017-07-14 LAB — PROTIME-INR
INR: 1.1
Prothrombin Time: 14.1 seconds (ref 11.4–15.2)

## 2017-07-14 MED ORDER — ONDANSETRON HCL 4 MG PO TABS: 4.0000 mg | ORAL_TABLET | Freq: Four times a day (QID) | ORAL | Status: DC | PRN

## 2017-07-14 MED ORDER — INSULIN GLARGINE 100 UNIT/ML ~~LOC~~ SOLN
10.0000 [IU] | Freq: Every day | SUBCUTANEOUS | Status: DC
  Administered 2017-07-15: 10 [IU] via SUBCUTANEOUS
  Filled 2017-07-14 (×2): qty 0.1

## 2017-07-14 MED ORDER — HYDROMORPHONE HCL 1 MG/ML IJ SOLN: 0.5000 mg | Freq: Four times a day (QID) | INTRAMUSCULAR | Status: DC | PRN

## 2017-07-14 MED ORDER — HYDROMORPHONE HCL 1 MG/ML IJ SOLN
1.0000 mg | Freq: Once | INTRAMUSCULAR | Status: AC
  Administered 2017-07-14: 1 mg via INTRAVENOUS
  Filled 2017-07-14: qty 1

## 2017-07-14 MED ORDER — INSULIN ASPART 100 UNIT/ML ~~LOC~~ SOLN
0.0000 [IU] | Freq: Three times a day (TID) | SUBCUTANEOUS | Status: DC
  Administered 2017-07-15: 2 [IU] via SUBCUTANEOUS
  Administered 2017-07-15: 3 [IU] via SUBCUTANEOUS
  Administered 2017-07-16: 5 [IU] via SUBCUTANEOUS
  Administered 2017-07-16: 9 [IU] via SUBCUTANEOUS

## 2017-07-14 MED ORDER — MIRTAZAPINE 15 MG PO TABS
15.0000 mg | ORAL_TABLET | Freq: Every day | ORAL | Status: DC
  Administered 2017-07-15 (×2): 15 mg via ORAL
  Filled 2017-07-14 (×2): qty 1

## 2017-07-14 MED ORDER — CARVEDILOL 25 MG PO TABS
25.0000 mg | ORAL_TABLET | Freq: Two times a day (BID) | ORAL | Status: DC
  Administered 2017-07-15 – 2017-07-18 (×6): 25 mg via ORAL
  Filled 2017-07-14 (×8): qty 1

## 2017-07-14 MED ORDER — SODIUM CHLORIDE 0.9 % IV BOLUS
500.0000 mL | Freq: Once | INTRAVENOUS | Status: AC
  Administered 2017-07-14: 500 mL via INTRAVENOUS

## 2017-07-14 MED ORDER — HYDROMORPHONE HCL 1 MG/ML IJ SOLN
1.0000 mg | Freq: Four times a day (QID) | INTRAMUSCULAR | Status: DC | PRN
  Administered 2017-07-14: 1 mg via INTRAVENOUS
  Filled 2017-07-14: qty 1

## 2017-07-14 MED ORDER — SPIRONOLACTONE 25 MG PO TABS: 25.0000 mg | ORAL_TABLET | Freq: Every day | ORAL | Status: DC

## 2017-07-14 MED ORDER — SERTRALINE HCL 50 MG PO TABS
50.0000 mg | ORAL_TABLET | Freq: Every day | ORAL | Status: DC
  Administered 2017-07-15 – 2017-07-16 (×2): 50 mg via ORAL
  Filled 2017-07-14 (×2): qty 1

## 2017-07-14 MED ORDER — ONDANSETRON HCL 4 MG/2ML IJ SOLN: 4.0000 mg | Freq: Four times a day (QID) | INTRAMUSCULAR | Status: DC | PRN

## 2017-07-14 MED ORDER — TORSEMIDE 20 MG PO TABS: 50.0000 mg | ORAL_TABLET | Freq: Every day | ORAL | Status: DC

## 2017-07-14 MED ORDER — PANCRELIPASE (LIP-PROT-AMYL) 36000-114000 UNITS PO CPEP
144000.0000 [IU] | ORAL_CAPSULE | Freq: Three times a day (TID) | ORAL | Status: DC
  Administered 2017-07-15 – 2017-07-16 (×5): 144000 [IU] via ORAL
  Filled 2017-07-14 (×6): qty 4

## 2017-07-14 MED ORDER — POLYETHYLENE GLYCOL 3350 17 G PO PACK: 17.0000 g | PACK | Freq: Every day | ORAL | Status: DC

## 2017-07-14 MED ORDER — ENOXAPARIN SODIUM 40 MG/0.4ML ~~LOC~~ SOLN
40.0000 mg | Freq: Every day | SUBCUTANEOUS | Status: DC
  Administered 2017-07-15 – 2017-07-17 (×4): 40 mg via SUBCUTANEOUS
  Filled 2017-07-14 (×4): qty 0.4

## 2017-07-14 MED ORDER — POTASSIUM CHLORIDE CRYS ER 20 MEQ PO TBCR: 20.0000 meq | EXTENDED_RELEASE_TABLET | Freq: Every day | ORAL | Status: DC

## 2017-07-14 MED ORDER — PREGABALIN 75 MG PO CAPS
75.0000 mg | ORAL_CAPSULE | Freq: Two times a day (BID) | ORAL | Status: DC
  Administered 2017-07-15 – 2017-07-16 (×4): 75 mg via ORAL
  Filled 2017-07-14 (×4): qty 1

## 2017-07-14 NOTE — H&P (Signed)
History and Physical    Melissa Myers LOV:[id number] DOB: Jun 24, 1945 DOA: 07/14/2017  PCP: [name], MD   Patient coming from: Home   Chief Complaint: Right buttock Pain  HPI: Melissa Myers is a 72 y.o. female with medical history significant for pancreatic cancer, chronic back pain, DM, depression, who presented to the ED with complaints of pain right upper buttock that started this morning.  Patient has a history of pancreatic cancer, follows with Dr. Burr Medico, current plan are to transition to hospice care.  Chemotherapy was discontinued due to poor performance status.  Patient was called, that patient was jaundiced and her cancer had progressed.  Hospice care was recommended, urgent referral to hospice was made today by patient's oncologist.  All day today patient had not gotten out of bed, because of worsening of her right buttock pain, unable to go to the bathroom.  Patient was recently admitted 4/26-4/30/19 for intractable back pain.  This time patient denies back pain, reports pain is in her right upper buttock area, aggravated by movement, she denies falls. Endorses Poor p.o. Intake.  ED Course: Bradycardic pulse 51-60, otherwise stable vitals.  CBC 11.5 hemoglobin stable 11.1. Alb- 2.6, ALP-elevated 563, bilirubin elevated 4.6.  Patient was given 1 mg Dilaudid in ED, improvement in pain.  Hospitalist was called to admit for intractable hip pain and to aid transition to hospice care.  Review of Systems: As per HPI otherwise 10 point review of systems negative.   Past Medical History:  Diagnosis Date  . Anemia   . Cancer [facility])    Pancreatic  . Cellulitis    LOWER EXTREMITIES  . Chronic diarrhea    a/w nausea - felt related to IBS  . Deaf    left side only  . Diastolic CHF ([place])   . Disc degeneration, lumbar   . Family history of breast cancer   . Family history of ovarian cancer   . Family history of stomach cancer   . GERD (gastroesophageal reflux  disease)   . Hyperlipidemia    hx rhabdo on statins  . Hypertension   . Neuropathy    feet, toes and fingers  . On home oxygen therapy    uses oxygen 2 liters min per [place] at night and prn during day  . OSA (obstructive sleep apnea)    05/2009 sleep study - refuses CPAP  . Osteoarthritis   . RLS (restless legs syndrome)   . Shortness of breath    chronic  . Stasis dermatitis   . Type II or unspecified type diabetes mellitus without mention of complication, not stated as uncontrolled    insulin dep    Past Surgical History:  Procedure Laterality Date  . BILIARY STENT PLACEMENT N/A 05/06/2016   Procedure: BILIARY STENT PLACEMENT;  Surgeon: Gatha Mayer, MD;  Location: [facility];  Service: Endoscopy;  Laterality: N/A;  . CHOLECYSTECTOMY  1997  . COLONOSCOPY N/A 12/03/2012   Procedure: COLONOSCOPY;  Surgeon: Lafayette Dragon, MD;  Location: [facility];  Service: Endoscopy;  Laterality: N/A;  . ENDOSCOPIC RETROGRADE CHOLANGIOPANCREATOGRAPHY (ERCP) WITH PROPOFOL N/A 05/06/2016   Procedure: ENDOSCOPIC RETROGRADE CHOLANGIOPANCREATOGRAPHY (ERCP) WITH PROPOFOL;  Surgeon: Gatha Mayer, MD;  Location: [facility];  Service: Endoscopy;  Laterality: N/A;  . ERCP N/A 03/18/2016   Procedure: ENDOSCOPIC RETROGRADE CHOLANGIOPANCREATOGRAPHY (ERCP);  Surgeon: Irene Shipper, MD;  Location: [facility];  Service: Endoscopy;  Laterality: N/A;  . EUS N/A 04/28/2016   Procedure: UPPER ENDOSCOPIC ULTRASOUND (  EUS) LINEAR;  Surgeon: [name], MD;  Location: [facility];  Service: Endoscopy;  Laterality: N/A;  . IR CHOLANGIOGRAM EXISTING TUBE  06/09/2016  . IR GENERIC HISTORICAL  05/07/2016   IR BILIARY DRAIN PLACEMENT WITH CHOLANGIOGRAM 05/07/2016 Jacqulynn Cadet, MD [facility]  . IR GENERIC HISTORICAL  05/19/2016   IR BILIARY STENT(S) EXISTING ACCESS INC DILATION CATH EXCHANGE 05/19/2016 Jacqulynn Cadet, MD [facility]  . TONSILLECTOMY  1970  . TUBAL LIGATION  1980  . UMBILICAL HERNIA REPAIR   1995  . uterine polyp removal  2008     reports that she has never smoked. She has never used smokeless tobacco. She reports that she does not drink alcohol or use drugs.  Allergies  Allergen Reactions  . Morphine Other (See Comments) and Nausea Only    GI upset and headaches GI upset and headaches  . Statins Other (See Comments)    Statin drugs cause muscle pain / "muscle damage"--was told by MD not to take rhabdomyolysis Statin drugs cause muscle pain / "muscle damage"--was told by MD not to take  . Sulfa Antibiotics Diarrhea and Nausea Only  . Cymbalta [Duloxetine Hcl] Other (See Comments)    Restless leg syndrome Restless leg syndrome, rash, itching, and diarrhea  . Sulfasalazine Diarrhea  . Levemir [Insulin Detemir] Itching  . Zinc Swelling, Rash, Hives and Other (See Comments)    Family History  Problem Relation Age of Onset  . Heart disease Mother   . Heart attack Mother 46  . Heart disease Father   . Heart attack Father 38  . Heart disease Unknown        family history  . Stomach cancer Paternal Grandmother   . Lung cancer Paternal Grandfather   . CVA Unknown        several aunts  . Heart attack Unknown        several aunts and an uncle  . Colon cancer Neg Hx     Prior to Admission medications   Medication Sig Start Date End Date Taking? Authorizing Provider  aspirin EC 81 MG tablet Take 1 tablet (81 mg total) by mouth daily. 07/08/14   Rowe Clack, MD  baclofen (LIORESAL) 10 MG tablet Take 0.5 tablets (5 mg total) by mouth 3 (three) times daily. Patient taking differently: Take 10 mg by mouth 3 (three) times daily.  05/02/17   Tanner, Lyndon Code., PA-C  carvedilol (COREG) 25 MG tablet TAKE ONE TABLET TWICE DAILY WITH FOOD 01/25/17   Larey Dresser, MD  Cholecalciferol (VITAMIN D3) 2000 units capsule Take 1 capsule (2,000 Units total) by mouth daily. 06/17/15   Janith Lima, MD  Continuous Blood Gluc Sensor ([place]) MISC Use to check  sugars 12/30/16   [name], MD  docusate sodium (COLACE) 100 MG capsule Take 1 capsule (100 mg total) by mouth 2 (two) times daily. 06/20/17 06/20/18  Lavina Hamman, MD  HYDROcodone-acetaminophen (NORCO/VICODIN) 5-325 MG tablet Take 1 tablet by mouth every 6 (six) hours as needed for moderate pain or severe pain. 06/20/17   Lavina Hamman, MD  insulin aspart (NOVOLOG) 100 UNIT/ML injection Inject 0-20 Units into the skin 3 (three) times daily with meals. . CBG 70 - 120: 0 units CBG 121 - 150: 3 units CBG 151 - 200: 4 units CBG 201 - 250: 7 units CBG 251 - 300: 11 units CBG 301 - 350: 15 units CBG 351 - 400: 20 units 03/21/16  Mariel Aloe, MD  insulin glargine (LANTUS) 100 UNIT/ML injection Inject 0.3 mLs (30 Units total) into the skin daily. 03/22/16   Mariel Aloe, MD  lidocaine (LIDODERM) 5 % Place 1 patch daily onto the skin. Remove & Discard patch within 12 hours or as directed by MD 12/30/16   [name], MD  lidocaine-prilocaine (EMLA) cream Apply to port site prior to being accessed 09/20/16   [provider]  lipase/protease/amylase (CREON) 36000 UNITS CPEP capsule 2 po prior to snacks; 4 po prior to meals 09/20/16   [provider]  metFORMIN (GLUCOPHAGE) 500 MG tablet Take 500 mg by mouth daily with breakfast.    [provider]  mirtazapine (REMERON) 15 MG tablet Take 1 tablet (15 mg total) by mouth at bedtime. 06/20/17   Lavina Hamman, MD  mupirocin ointment (BACTROBAN) 2 % Apply 1 application topically daily. 03/10/17   [name], MD  ondansetron (ZOFRAN) 8 MG tablet Take 1 tablet (8 mg total) by mouth every 8 (eight) hours as needed for nausea or vomiting. 01/10/17   Truitt Merle, MD  [facility] VERIO test strip USE AS INSTRUCTED UP TO 4 TIMES DAILY 11/03/16   [name], MD  oxyCODONE (OXY IR/ROXICODONE) 5 MG immediate release tablet Take 1 tablet (5 mg total) by mouth every 6 (six) hours as needed for severe  pain. 07/12/17   Truitt Merle, MD  polyethylene glycol [facility] Floria Raveling) packet Take 17 g by mouth daily. 06/21/17   Lavina Hamman, MD  potassium chloride SA (K-DUR,KLOR-CON) 20 MEQ tablet Take 20 mEq by mouth daily.    [provider]  pregabalin (LYRICA) 75 MG capsule Take 1 capsule (75 mg total) by mouth 2 (two) times daily. 06/29/17   [name], MD  rOPINIRole (REQUIP) 2 MG tablet Take 1 tablet (2 mg total) by mouth 2 (two) times daily. 06/20/17   Lavina Hamman, MD  sertraline (ZOLOFT) 50 MG tablet Take 50 mg by mouth daily.    [provider]  silver sulfADIAZINE (SILVADENE) 1 % cream Apply 1 application daily topically. 01/03/17   [name], MD  spironolactone (ALDACTONE) 25 MG tablet Take 1 tablet (25 mg total) by mouth daily. 06/17/16 11/24/17  Larey Dresser, MD  torsemide (DEMADEX) 100 MG tablet Take 50 mg by mouth daily.    [provider]  traMADol (ULTRAM) 50 MG tablet Take 1 tablet (50 mg total) by mouth every 6 (six) hours as needed. Patient taking differently: Take 50 mg every 6 (six) hours as needed by mouth for moderate pain.  11/03/16   [name], MD    Physical Exam: Vitals:   07/14/17 2011 07/14/17 2030 07/14/17 2115 07/14/17 2212  BP: (!) 141/62 (!) 111/44 (!) 145/57 (!) 124/55  Pulse: 62 (!) 58 (!) 55 (!) 53  Resp: 16   18  Temp:      TempSrc:      SpO2: 96% 90% 96% 91%    Constitutional: drowsy Vitals:   07/14/17 2011 07/14/17 2030 07/14/17 2115 07/14/17 2212  BP: (!) 141/62 (!) 111/44 (!) 145/57 (!) 124/55  Pulse: 62 (!) 58 (!) 55 (!) 53  Resp: 16   18  Temp:      TempSrc:      SpO2: 96% 90% 96% 91%   Eyes: PERRL, lids and conjunctivae normal ENMT: Mucous membranes are moist. Posterior pharynx clear of any exudate or lesions.Normal dentition.  Neck: normal, supple,  no masses, no thyromegaly Respiratory: clear to auscultation bilaterally, no wheezing, no crackles. Normal respiratory effort. No  accessory muscle use.  Cardiovascular: Regular rate and rhythm, 4/6 systolic murmur- not new, No extremity edema. 2+ pedal pulses. No carotid bruits.  Abdomen: no tenderness, no masses palpated. No hepatosplenomegaly. Bowel sounds positive.  Tenderness to palpation right upper buttock area. Musculoskeletal: no clubbing / cyanosis. No obvious joint deformity upper and lower extremities.  Able to move bilateral lower extremities, pain has improved skin: no rashes, lesions, ulcers. No induration Neurologic: CN 2-12 grossly intact. Strength 5/5 in all 4.  Psychiatric: Normal judgment. drowsy status post 1 mg Dilaudid, easily arousable oriented x 3. Normal mood.   Labs on Admission: I have personally reviewed following labs and imaging studies  CBC: Recent Labs  Lab 07/12/17 1108 07/14/17 2006  WBC 14.6* 11.5*  NEUTROABS 12.2* 8.8*  HGB 11.6 11.1*  HCT 36.2 34.4*  MCV 78.8* 79.4  PLT 318 631   Basic Metabolic Panel: Recent Labs  Lab 07/11/17 1636 07/12/17 1108 07/14/17 2006  NA 135 139 139  K 4.7 4.1 3.6  CL 96* 99 98*  CO2 28 27 27   GLUCOSE 188* 183* 138*  BUN 39* 41* 37*  CREATININE 1.10* 1.35* 0.98  CALCIUM 9.0 9.4 8.9   Liver Function Tests: Recent Labs  Lab 07/12/17 1108 07/14/17 2006  AST 81* 43*  ALT 41 27  ALKPHOS 821* 563*  BILITOT 2.5* 4.6*  PROT 7.5 7.1  ALBUMIN 2.7* 2.6*   Coagulation Profile: Recent Labs  Lab 07/14/17 2006  INR 1.10   Urine analysis:    Component Value Date/Time   COLORURINE YELLOW 06/19/2017 1402   APPEARANCEUR CLOUDY (A) 06/19/2017 1402   LABSPEC 1.014 06/19/2017 1402   PHURINE 5.0 06/19/2017 1402   GLUCOSEU NEGATIVE 06/19/2017 1402   GLUCOSEU NEGATIVE 11/04/2015 0956   HGBUR SMALL (A) 06/19/2017 1402   BILIRUBINUR NEGATIVE 06/19/2017 1402   BILIRUBINUR Neg 11/03/2016 1701   KETONESUR NEGATIVE 06/19/2017 1402   PROTEINUR NEGATIVE 06/19/2017 1402   UROBILINOGEN 0.2 11/03/2016 1701   UROBILINOGEN 0.2 11/04/2015 0956    NITRITE NEGATIVE 06/19/2017 1402   LEUKOCYTESUR LARGE (A) 06/19/2017 1402    Radiological Exams on Admission: No results found.  EKG: None    Assessment/Plan Principal Problem:   Intractable pain Active Problems:   Essential hypertension   Chronic diastolic heart failure (HCC)   Hyperlipidemia   IDDM (insulin dependent diabetes mellitus) (HCC)   MDD (major depressive disorder), single episode, severe (HCC)   Malignant neoplasm of head of pancreas ([place])   Intractable pain-likely related to patient's chronic back pain and/or pancreatic cancer.  MRI recent admission with and without contrast- l4-l5 severe multifactorial canal stenosis, L5-S1 moderate to severe foraminal stenosis.  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 Annamaria Boots with oncology status post radiation and chemotherapy. Chemotherapy was discontinued due to poor performance status, low benefits, intolerance. -Palliative care consult a.m.  Diastolic CHF-last echo 4/97/02.,  EF 65 to 70%, G2DD.  Appears euvolemic creatinine- 0.9, improved compared to baseline..  -Patient spironolactone and torsemide have been discontinued.   DVT prophylaxis: Lovenox Code Status: DNR- confirmed with patient at bedside. Family Communication: Daughter later present at bedside Disposition Plan: Per rounding team,  likely transition to hospice Consults called: Palliative care consult a.m Admission status: Obs, med surg   Bethena Roys MD [facility] Pager [phone] From 6PM-2AM.  Otherwise please contact night-coverage [email or web address] Password [facility]  07/14/2017, 10:24 PM

## 2017-07-14 NOTE — Progress Notes (Signed)
Ambulatory referral to hospice entered, lvm for [facility] with [facility] to return my call.

## 2017-07-14 NOTE — ED Provider Notes (Signed)
[facility] DEPT Provider Note   CSN: [id number] Arrival date & time: 07/14/17  1733     History   Chief Complaint Chief Complaint  Patient presents with  . Back Pain    HPI KIM OKI is a 72 y.o. female.  The history is provided by the patient and medical records.  Back Pain   This is a recurrent problem. The current episode started 12 to 24 hours ago. The problem occurs constantly. The problem has not changed since onset.The pain is associated with no known injury. The pain is present in the lumbar spine. The quality of the pain is described as stabbing and aching. The pain does not radiate. The pain is at a severity of 10/10. The pain is severe. The symptoms are aggravated by bending, twisting and certain positions. The pain is the same all the time. Associated symptoms include dysuria. Pertinent negatives include no chest pain, no fever, no numbness, no headaches, no abdominal pain, no bowel incontinence, no bladder incontinence, no leg pain, no paresthesias, no paresis, no tingling and no weakness. She has tried analgesics for the symptoms. The treatment provided no relief.    Past Medical History:  Diagnosis Date  . Anemia   . Cancer [facility])    Pancreatic  . Cellulitis    LOWER EXTREMITIES  . Chronic diarrhea    a/w nausea - felt related to IBS  . Deaf    left side only  . Diastolic CHF ([place])   . Disc degeneration, lumbar   . Family history of breast cancer   . Family history of ovarian cancer   . Family history of stomach cancer   . GERD (gastroesophageal reflux disease)   . Hyperlipidemia    hx rhabdo on statins  . Hypertension   . Neuropathy    feet, toes and fingers  . On home oxygen therapy    uses oxygen 2 liters min per [place] at night and prn during day  . OSA (obstructive sleep apnea)    05/2009 sleep study - refuses CPAP  . Osteoarthritis   . RLS (restless legs syndrome)   . Shortness of breath    chronic  . Stasis  dermatitis   . Type II or unspecified type diabetes mellitus without mention of complication, not stated as uncontrolled    insulin dep    Patient Active Problem List   Diagnosis Date Noted  . Back pain 06/19/2017  . Intractable back pain 06/17/2017  . Genetic testing 06/16/2017  . Venous insufficiency 03/10/2017  . Port-A-Cath in place 02/01/2017  . Diarrhea 01/10/2017  . Goals of care, counseling/discussion   . Malignant neoplasm of head of pancreas ([place])   . Cholangitis 05/05/2016  . Common bile duct stricture   . Hyperbilirubinemia 03/15/2016  . Diabetic peripheral neuropathy ([place]) 02/23/2016  . Murmur, cardiac 07/02/2015  . Chronic pain syndrome 05/05/2015  . MDD (major depressive disorder), single episode, severe ([place]) 05/05/2015  . Chronic anemia 04/14/2015  . Cellulitis 04/14/2015  . IDDM (insulin dependent diabetes mellitus) ([place]) 03/19/2015  . Hyperlipidemia 12/23/2014  . Chronic diastolic heart failure ([place]) 06/01/2009  . Vitamin D deficiency 05/12/2009  . Sleep apnea 04/29/2009  . Dyslipidemia 04/20/2009  . Morbid obesity ([place]) 04/20/2009  . RESTLESS LEGS SYNDROME 04/20/2009  . Essential hypertension 04/20/2009    Past Surgical History:  Procedure Laterality Date  . BILIARY STENT PLACEMENT N/A 05/06/2016   Procedure: BILIARY STENT PLACEMENT;  Surgeon: Gatha Mayer, MD;  Location: [facility];  Service: Endoscopy;  Laterality: N/A;  . CHOLECYSTECTOMY  1997  . COLONOSCOPY N/A 12/03/2012   Procedure: COLONOSCOPY;  Surgeon: Lafayette Dragon, MD;  Location: [facility] ENDOSCOPY;  Service: Endoscopy;  Laterality: N/A;  . ENDOSCOPIC RETROGRADE CHOLANGIOPANCREATOGRAPHY (ERCP) WITH PROPOFOL N/A 05/06/2016   Procedure: ENDOSCOPIC RETROGRADE CHOLANGIOPANCREATOGRAPHY (ERCP) WITH PROPOFOL;  Surgeon: Gatha Mayer, MD;  Location: [facility];  Service: Endoscopy;  Laterality: N/A;  . ERCP N/A 03/18/2016   Procedure: ENDOSCOPIC RETROGRADE CHOLANGIOPANCREATOGRAPHY (ERCP);  Surgeon:  Irene Shipper, MD;  Location: [facility];  Service: Endoscopy;  Laterality: N/A;  . EUS N/A 04/28/2016   Procedure: UPPER ENDOSCOPIC ULTRASOUND (EUS) LINEAR;  Surgeon: [name], MD;  Location: [facility];  Service: Endoscopy;  Laterality: N/A;  . IR CHOLANGIOGRAM EXISTING TUBE  06/09/2016  . IR GENERIC HISTORICAL  05/07/2016   IR BILIARY DRAIN PLACEMENT WITH CHOLANGIOGRAM 05/07/2016 Jacqulynn Cadet, MD [facility]  . IR GENERIC HISTORICAL  05/19/2016   IR BILIARY STENT(S) EXISTING ACCESS INC DILATION CATH EXCHANGE 05/19/2016 Jacqulynn Cadet, MD [facility]  . TONSILLECTOMY  1970  . TUBAL LIGATION  1980  . UMBILICAL HERNIA REPAIR  1995  . uterine polyp removal  2008     OB History   None      Home Medications    Prior to Admission medications   Medication Sig Start Date End Date Taking? Authorizing Provider  aspirin EC 81 MG tablet Take 1 tablet (81 mg total) by mouth daily. 07/08/14   Rowe Clack, MD  baclofen (LIORESAL) 10 MG tablet Take 0.5 tablets (5 mg total) by mouth 3 (three) times daily. Patient taking differently: Take 10 mg by mouth 3 (three) times daily.  05/02/17   Tanner, Lyndon Code., PA-C  carvedilol (COREG) 25 MG tablet TAKE ONE TABLET TWICE DAILY WITH FOOD 01/25/17   Larey Dresser, MD  Cholecalciferol (VITAMIN D3) 2000 units capsule Take 1 capsule (2,000 Units total) by mouth daily. 06/17/15   Janith Lima, MD  Continuous Blood Gluc Sensor ([place]) MISC Use to check sugars 12/30/16   [name], MD  docusate sodium (COLACE) 100 MG capsule Take 1 capsule (100 mg total) by mouth 2 (two) times daily. 06/20/17 06/20/18  Lavina Hamman, MD  HYDROcodone-acetaminophen (NORCO/VICODIN) 5-325 MG tablet Take 1 tablet by mouth every 6 (six) hours as needed for moderate pain or severe pain. 06/20/17   Lavina Hamman, MD  insulin aspart (NOVOLOG) 100 UNIT/ML injection Inject 0-20 Units into the skin 3 (three) times daily with meals.  . CBG 70 - 120: 0 units CBG 121 - 150: 3 units CBG 151 - 200: 4 units CBG 201 - 250: 7 units CBG 251 - 300: 11 units CBG 301 - 350: 15 units CBG 351 - 400: 20 units 03/21/16   Mariel Aloe, MD  insulin glargine (LANTUS) 100 UNIT/ML injection Inject 0.3 mLs (30 Units total) into the skin daily. 03/22/16   Mariel Aloe, MD  lidocaine (LIDODERM) 5 % Place 1 patch daily onto the skin. Remove & Discard patch within 12 hours or as directed by MD 12/30/16   [name], MD  lidocaine-prilocaine (EMLA) cream Apply to port site prior to being accessed 09/20/16   [provider]  lipase/protease/amylase (CREON) 36000 UNITS CPEP capsule 2 po prior to snacks; 4 po prior to meals 09/20/16   [provider]  metFORMIN (GLUCOPHAGE) 500 MG tablet Take 500 mg by  mouth daily with breakfast.    [provider]  mirtazapine (REMERON) 15 MG tablet Take 1 tablet (15 mg total) by mouth at bedtime. 06/20/17   Lavina Hamman, MD  mupirocin ointment (BACTROBAN) 2 % Apply 1 application topically daily. 03/10/17   [name], MD  ondansetron (ZOFRAN) 8 MG tablet Take 1 tablet (8 mg total) by mouth every 8 (eight) hours as needed for nausea or vomiting. 01/10/17   Truitt Merle, MD  [facility] VERIO test strip USE AS INSTRUCTED UP TO 4 TIMES DAILY 11/03/16   [name], MD  oxyCODONE (OXY IR/ROXICODONE) 5 MG immediate release tablet Take 1 tablet (5 mg total) by mouth every 6 (six) hours as needed for severe pain. 07/12/17   Truitt Merle, MD  polyethylene glycol [facility] Floria Raveling) packet Take 17 g by mouth daily. 06/21/17   Lavina Hamman, MD  potassium chloride SA (K-DUR,KLOR-CON) 20 MEQ tablet Take 20 mEq by mouth daily.    [provider]  pregabalin (LYRICA) 75 MG capsule Take 1 capsule (75 mg total) by mouth 2 (two) times daily. 06/29/17   [name], MD  rOPINIRole (REQUIP) 2 MG tablet Take 1 tablet (2 mg total) by mouth 2 (two) times daily. 06/20/17    Lavina Hamman, MD  sertraline (ZOLOFT) 50 MG tablet Take 50 mg by mouth daily.    [provider]  silver sulfADIAZINE (SILVADENE) 1 % cream Apply 1 application daily topically. 01/03/17   [name], MD  spironolactone (ALDACTONE) 25 MG tablet Take 1 tablet (25 mg total) by mouth daily. 06/17/16 11/24/17  Larey Dresser, MD  torsemide (DEMADEX) 100 MG tablet Take 50 mg by mouth daily.    [provider]  traMADol (ULTRAM) 50 MG tablet Take 1 tablet (50 mg total) by mouth every 6 (six) hours as needed. Patient taking differently: Take 50 mg every 6 (six) hours as needed by mouth for moderate pain.  11/03/16   [name], MD    Family History Family History  Problem Relation Age of Onset  . Heart disease Mother   . Heart attack Mother 76  . Heart disease Father   . Heart attack Father 58  . Heart disease Unknown        family history  . Stomach cancer Paternal Grandmother   . Lung cancer Paternal Grandfather   . CVA Unknown        several aunts  . Heart attack Unknown        several aunts and an uncle  . Colon cancer Neg Hx     Social History Social History   Tobacco Use  . Smoking status: Never Smoker  . Smokeless tobacco: Never Used  Substance Use Topics  . Alcohol use: No  . Drug use: No     Allergies   Morphine; Statins; Sulfa antibiotics; Cymbalta [duloxetine hcl]; Sulfasalazine; Levemir [insulin detemir]; and Zinc   Review of Systems Review of Systems  Constitutional: Positive for fatigue. Negative for chills, diaphoresis and fever.  HENT: Negative for congestion.   Respiratory: Negative for cough, chest tightness, shortness of breath, wheezing and stridor.   Cardiovascular: Negative for chest pain and palpitations.  Gastrointestinal: Negative for abdominal pain, bowel incontinence, constipation, diarrhea, nausea and vomiting.  Genitourinary: Positive for dysuria. Negative for bladder incontinence and urgency.    Musculoskeletal: Positive for back pain. Negative for neck pain and neck stiffness.  Skin: Negative for rash and wound.  Neurological:  Negative for tingling, weakness, light-headedness, numbness, headaches and paresthesias.  Psychiatric/Behavioral: Negative for agitation.  All other systems reviewed and are negative.    Physical Exam Updated Vital Signs BP (!) 162/68 (BP Location: Right Arm)   Pulse (!) 54   Temp 98.3 F (36.8 C) (Oral)   Resp 18   SpO2 91%   Physical Exam  Constitutional: She is oriented to person, place, and time. No distress.  HENT:  Head: Normocephalic.  Nose: Nose normal.  Mouth/Throat: Oropharynx is clear and moist. No oropharyngeal exudate.  Eyes: Scleral icterus is present.  Neck: Normal range of motion.  Cardiovascular: Normal rate and intact distal pulses.  No murmur heard. Pulmonary/Chest: Effort normal. No respiratory distress. She exhibits no tenderness.  Abdominal: Soft. She exhibits no distension. There is no tenderness.  Musculoskeletal: She exhibits edema (chronic in legs per pt) and tenderness.  Lymphadenopathy:    She has no cervical adenopathy.  Neurological: She is alert and oriented to person, place, and time. No sensory deficit. She exhibits normal muscle tone.  Skin: No rash noted. She is not diaphoretic. No erythema.  Psychiatric: She has a normal mood and affect.  Nursing note and vitals reviewed.    ED Treatments / Results  Labs (all labs ordered are listed, but only abnormal results are displayed) Labs Reviewed  CBC WITH DIFFERENTIAL/PLATELET - Abnormal; Notable for the following components:      Result Value   WBC 11.5 (*)    Hemoglobin 11.1 (*)    HCT 34.4 (*)    MCH 25.6 (*)    RDW 16.2 (*)    Neutro Abs 8.8 (*)    All other components within normal limits  COMPREHENSIVE METABOLIC PANEL - Abnormal; Notable for the following components:   Chloride 98 (*)    Glucose, Bld 138 (*)    BUN 37 (*)    Albumin 2.6 (*)     AST 43 (*)    Alkaline Phosphatase 563 (*)    Total Bilirubin 4.6 (*)    GFR calc non Af Amer 56 (*)    All other components within normal limits  URINE CULTURE  PROTIME-INR  URINALYSIS, ROUTINE W REFLEX MICROSCOPIC    EKG None  Radiology Dg Pelvis 1-2 Views  Result Date: 07/14/2017 CLINICAL DATA:  Left hip pain and back pain after a fall. History of pancreatic cancer. EXAM: PELVIS - 1-2 VIEW COMPARISON:  06/17/2017 FINDINGS: There is no evidence of pelvic fracture or diastasis. No pelvic bone lesions are seen. Mild degenerative changes in the hips. IMPRESSION: No acute bony abnormalities demonstrated. Electronically Signed   By: Lucienne Capers M.D.   On: 07/14/2017 23:52    Procedures Procedures (including critical care time)  Medications Ordered in ED Medications  HYDROmorphone (DILAUDID) injection 1 mg (1 mg Intravenous Given 07/14/17 2014)  sodium chloride 0.9 % bolus 500 mL (500 mLs Intravenous New Bag/Given 07/14/17 2014)     Initial Impression / Assessment and Plan / ED Course  I have reviewed the triage vital signs and the nursing notes.  Pertinent labs & imaging results that were available during my care of the patient were reviewed by me and considered in my medical decision making (see chart for details).     REMI RESTER is a 72 y.o. female with a past medical history significant for CHF, hypertension, hyperlipidemia, restless leg syndrome, and pancreatic cancer who presents for severe back pain.  Patient reports she has chronic back pain but today her  back pain has been unbearable.  She reports is 10 out of 10 and has not been managed at home with pain medications.  She says that she is unable to sit up, rollover, or ambulate.  She lives at home.  She says that she has been  unable to get the bathroom and has been having incontinence today.  She reports it is due to her being unable to get around and she is not losing control of her bowels or bladder.  She  denies numbness, tingling, or weakness in her legs aside from her baseline neuropathies.  She denies fevers, chills, congestion, or cough.  She denies chest pain or shortness of breath.  She reports she has not been eating or drinking and has had worsening jaundice over the last week.  She saw her oncologist yesterday and the decision was made to pursue hospice care.  Caregiver and patient report that hospice team is aware that she is coming to the hospital tonight and will see her tomorrow.  On my exam, patient is jaundiced and in severe back pain.  Patient had normal sensation and  Was able to move both of her legs with discomfort.  Leg movement appears to be limited by back pain.  Patient did not have numbness.  Patient had some edema in her legs.  Patient had clear lungs and chest and abdomen were nontender.  A shared decision-making conversation was held with patient and her caregiver.  They agreed that given the lack of new injury or falls, they agree on holding on imaging at this time given her likely start of hospice tomorrow.  They do say that her urine has been darker and with her decreased oral intake, they agree with laboratory testing urinalysis as well as gentle rehydration and pain medicine.  Given patient's inability to ambulate with uncontrolled pain and her impending hospice evaluation, patient will require admission for further symptom management.  9:30 PM Diagnostic laboratory testing is began to return.  Patient has improved leukocytosis from prior but has similar anemia.  Metabolic panel shows worsened alk phos but AST and ALT are improved.  No evidence of worsened kidney function.  The urinalysis was sent down however apparently there was a problem with the labeling.  They will need to recollect the urine.  As the urine is not going to change the patient's disposition as she requires admission for inability to twist, sit up, move, or manage her back pain at home and has dampening  hospice evaluation, patient will be called for admission while urinalysis is still awaiting result.   Final Clinical Impressions(s) / ED Diagnoses   Final diagnoses:  Acute low back pain without sciatica, unspecified back pain laterality    ED Discharge Orders    None      Clinical Impression: 1. Acute low back pain without sciatica, unspecified back pain laterality     Disposition: Admit  This note was prepared with assistance of Dragon voice recognition software. Occasional wrong-word or sound-a-like substitutions may have occurred due to the inherent limitations of voice recognition software.     Tegeler, Gwenyth Allegra, MD 07/15/17 [phone]

## 2017-07-14 NOTE — Telephone Encounter (Signed)
Patient's daughter Melissa Myers called extremely upset as patient was seen by Dr. Sharlet Salina yesterday and told that she is now jaundiced and most likely her cancer has progressed.  She suggested Hospice.  Her daughter would very much like Dr. Burr Medico to call her at her convenience regarding her mother's condition and which direction they should go in at this time.  Her [phone]

## 2017-07-14 NOTE — ED Notes (Signed)
Pt urinated on self and is refusing to be moved in order to be cleaned because she is in pain

## 2017-07-14 NOTE — ED Notes (Signed)
Bed: [facility] Expected date:  Expected time:  Means of arrival:  Comments: No bed 

## 2017-07-14 NOTE — Telephone Encounter (Signed)
I spoke with pt's daughter today and reviewed with we discussed when I saw her a few days ago. I recommend hospice, which will give her the best support she needs at home. After a lengthy discussion, she agreed. Pt has worsening pain, nausea, and not eating, has not gotten out of bed today, I made urgent hospice referral today.   Truitt Merle MD

## 2017-07-14 NOTE — ED Triage Notes (Signed)
Per EMS pt from home with right lower back pain since this morning. Hx of pancreatic cancer and will be transitioning to hospice care soon.

## 2017-07-14 NOTE — Telephone Encounter (Signed)
[place] with referral for this patient, patient currently receiving palliative care through this agency.  Spoke with Ronnell Guadalajara at [phone] and explained family is in agreement, patient having a lot of discomfort and requested visit to patient by tomorrow to assess.  Dr. Burr Medico to remain attending physician for this patient.

## 2017-07-14 NOTE — ED Notes (Signed)
ED TO INPATIENT HANDOFF REPORT  Name/Age/Gender Melissa Myers 72 y.o. female  Code Status Code Status History    Date Active Date Inactive Code Status Order ID Comments User Context   06/17/2017 0238 06/20/2017 2011 Full Code [id number]  Bethena Roys, MD Inpatient   05/19/2016 2122 05/24/2016 2139 Full Code [id number]  [name], MD Inpatient   05/05/2016 0645 05/13/2016 1748 Full Code [id number]  [name], MD Inpatient   03/15/2016 1742 03/21/2016 1708 Full Code [id number]  [name], MD Inpatient   10/09/2015 0135 10/10/2015 1524 Full Code [id number]  [name], MD Inpatient   04/14/2015 0649 04/18/2015 1445 Full Code [id number]  [name], MD Inpatient   01/22/2015 1306 01/26/2015 1628 Full Code [id number]  Conrad [place], NP Inpatient   11/15/2014 0051 11/20/2014 1651 Full Code [id number]  [name], MD ED   10/23/2014 0054 10/26/2014 1837 Full Code [id number]  [name], MD ED   12/05/2013 1614 12/11/2013 1707 Full Code [id number]  Conrad [place], NP Inpatient   07/11/2012 1933 07/16/2012 2031 Full Code [id number]  Elvera Lennox, MD Inpatient      Home/SNF/Other Home  Chief Complaint lower back pains  Level of Care/Admitting Diagnosis ED Disposition    ED Disposition Condition [facility] Area: [facility] [[id number]]  Level of Care: Med-Surg [16]  Diagnosis: Intractable pain [115726]  Admitting Physician: Bethena Roys [[id number]]  Attending Physician: Bethena Roys [phone]  PT Class (Do Not Modify): Observation [104]  PT Acc Code (Do Not Modify): Observation [10022]       Medical History Past Medical History:  Diagnosis Date  . Anemia   . Cancer [facility])    Pancreatic  . Cellulitis    LOWER EXTREMITIES  . Chronic diarrhea    a/w nausea - felt related to IBS  . Deaf    left side only  . Diastolic CHF ([place])   . Disc degeneration, lumbar   . Family history of breast cancer    . Family history of ovarian cancer   . Family history of stomach cancer   . GERD (gastroesophageal reflux disease)   . Hyperlipidemia    hx rhabdo on statins  . Hypertension   . Neuropathy    feet, toes and fingers  . On home oxygen therapy    uses oxygen 2 liters min per [place] at night and prn during day  . OSA (obstructive sleep apnea)    05/2009 sleep study - refuses CPAP  . Osteoarthritis   . RLS (restless legs syndrome)   . Shortness of breath    chronic  . Stasis dermatitis   . Type II or unspecified type diabetes mellitus without mention of complication, not stated as uncontrolled    insulin dep    Allergies Allergies  Allergen Reactions  . Morphine Other (See Comments) and Nausea Only    GI upset and headaches GI upset and headaches  . Statins Other (See Comments)    Statin drugs cause muscle pain / "muscle damage"--was told by MD not to take rhabdomyolysis Statin drugs cause muscle pain / "muscle damage"--was told by MD not to take  . Sulfa Antibiotics Diarrhea and Nausea Only  . Cymbalta [Duloxetine Hcl] Other (See Comments)    Restless leg syndrome Restless leg syndrome, rash, itching, and diarrhea  . Sulfasalazine Diarrhea  . Levemir [Insulin Detemir] Itching  . Zinc Swelling, Rash, Hives and Other (See  Comments)    IV Location/Drains/Wounds Patient Lines/Drains/Airways Status   Active Line/Drains/Airways    Name:   Placement date:   Placement time:   Site:   Days:   Implanted Port Right Chest   -    -    Chest      Closed System Drain 1 Right RUQ Other (Comment) 10 Fr.   05/07/16    1025    RUQ   433   External Urinary Catheter   06/16/17    1740    -   28   Wound / Incision (Open or Dehisced) 03/15/16 Diabetic ulcer Toe (Comment  which one) Right r greta toe,under,outer aspect,small ,dry,black caloous-like area   03/15/16    1935    Toe (Comment  which one)   486          Labs/Imaging Results for orders placed or performed during the hospital  encounter of 07/14/17 (from the past 48 hour(s))  CBC with Differential     Status: Abnormal   Collection Time: 07/14/17  8:06 PM  Result Value Ref Range   WBC 11.5 (H) 4.0 - 10.5 K/uL   RBC 4.33 3.87 - 5.11 MIL/uL   Hemoglobin 11.1 (L) 12.0 - 15.0 g/dL   HCT 34.4 (L) 36.0 - 46.0 %   MCV 79.4 78.0 - 100.0 fL   MCH 25.6 (L) 26.0 - 34.0 pg   MCHC 32.3 30.0 - 36.0 g/dL   RDW 16.2 (H) 11.5 - 15.5 %   Platelets 342 150 - 400 K/uL   Neutrophils Relative % 77 %   Neutro Abs 8.8 (H) 1.7 - 7.7 K/uL   Lymphocytes Relative 15 %   Lymphs Abs 1.8 0.7 - 4.0 K/uL   Monocytes Relative 8 %   Monocytes Absolute 0.9 0.1 - 1.0 K/uL   Eosinophils Relative 0 %   Eosinophils Absolute 0.1 0.0 - 0.7 K/uL   Basophils Relative 0 %   Basophils Absolute 0.0 0.0 - 0.1 K/uL    Comment: Performed at [facility], [place] [address], [place], [place] [address]  Comprehensive metabolic panel     Status: Abnormal   Collection Time: 07/14/17  8:06 PM  Result Value Ref Range   Sodium 139 135 - 145 mmol/L   Potassium 3.6 3.5 - 5.1 mmol/L   Chloride 98 (L) 101 - 111 mmol/L   CO2 27 22 - 32 mmol/L   Glucose, Bld 138 (H) 65 - 99 mg/dL   BUN 37 (H) 6 - 20 mg/dL   Creatinine, Ser 0.98 0.44 - 1.00 mg/dL   Calcium 8.9 8.9 - 10.3 mg/dL   Total Protein 7.1 6.5 - 8.1 g/dL   Albumin 2.6 (L) 3.5 - 5.0 g/dL   AST 43 (H) 15 - 41 U/L   ALT 27 14 - 54 U/L   Alkaline Phosphatase 563 (H) 38 - 126 U/L   Total Bilirubin 4.6 (H) 0.3 - 1.2 mg/dL   GFR calc non Af Amer 56 (L) >60 mL/min   GFR calc Af Amer >60 >60 mL/min    Comment: (NOTE) The eGFR has been calculated using the CKD EPI equation. This calculation has not been validated in all clinical situations. eGFR's persistently <60 mL/min signify possible Chronic Kidney Disease.    Anion gap 14 5 - 15    Comment: Performed at [facility], [place] [address], [place], [place] [address]  Protime-INR     Status: None   Collection Time: 07/14/17  8:06 PM  Result Value Ref Range   Prothrombin Time 14.1 11.4 - 15.2 seconds   INR 1.10     Comment: Performed at [facility], [place] [address] [place], [place] [address]   *Note: Due to a large number of results and/or encounters for the requested time period, some results have not been displayed. A complete set of results can be found in Results Review.   No results found.  Pending Labs Unresulted Labs (From admission, onward)   Start     Ordered   07/14/17 1945  Urinalysis, Routine w reflex microscopic  Once,   R     07/14/17 1945   07/14/17 1945  Urine culture  STAT,   STAT     07/14/17 1945      Vitals/Pain Today's Vitals   07/14/17 2053 07/14/17 2115 07/14/17 2200 07/14/17 2212  BP:  (!) 145/57 (!) 124/55 (!) 124/55  Pulse:  (!) 55 (!) 51 (!) 53  Resp:    18  Temp:      TempSrc:      SpO2:  96% 92% 91%  PainSc: 5        Isolation Precautions No active isolations  Medications Medications  HYDROmorphone (DILAUDID) injection 0.5 mg (has no administration in time range)  HYDROmorphone (DILAUDID) injection 1 mg (1 mg Intravenous Given 07/14/17 2014)  sodium chloride 0.9 % bolus 500 mL (0 mLs Intravenous Stopped 07/14/17 2247)    Mobility  Walks with assist

## 2017-07-15 ENCOUNTER — Other Ambulatory Visit: Payer: Self-pay

## 2017-07-15 DIAGNOSIS — R52 Pain, unspecified: Secondary | ICD-10-CM | POA: Diagnosis not present

## 2017-07-15 MED ORDER — CAMPHOR-MENTHOL 0.5-0.5 % EX LOTN
TOPICAL_LOTION | CUTANEOUS | Status: DC | PRN
  Filled 2017-07-15: qty 222

## 2017-07-15 MED ORDER — INSULIN ASPART 100 UNIT/ML ~~LOC~~ SOLN
20.0000 [IU] | Freq: Once | SUBCUTANEOUS | Status: AC
  Administered 2017-07-15: 20 [IU] via SUBCUTANEOUS

## 2017-07-15 MED ORDER — INSULIN ASPART 100 UNIT/ML ~~LOC~~ SOLN
7.0000 [IU] | Freq: Once | SUBCUTANEOUS | Status: AC
  Administered 2017-07-15: 7 [IU] via SUBCUTANEOUS

## 2017-07-15 MED ORDER — HYDROMORPHONE HCL 1 MG/ML IJ SOLN
0.5000 mg | INTRAMUSCULAR | Status: DC | PRN
  Administered 2017-07-15 – 2017-07-16 (×2): 0.5 mg via INTRAVENOUS
  Filled 2017-07-15 (×2): qty 1

## 2017-07-15 MED ORDER — INSULIN ASPART 100 UNIT/ML ~~LOC~~ SOLN
0.0000 [IU] | Freq: Three times a day (TID) | SUBCUTANEOUS | Status: DC
  Administered 2017-07-16: 15 [IU] via SUBCUTANEOUS
  Administered 2017-07-16: 8 [IU] via SUBCUTANEOUS

## 2017-07-15 MED ORDER — INSULIN ASPART 100 UNIT/ML ~~LOC~~ SOLN: 3.0000 [IU] | Freq: Three times a day (TID) | SUBCUTANEOUS | Status: DC

## 2017-07-15 MED ORDER — POLYETHYLENE GLYCOL 3350 17 G PO PACK: 17.0000 g | PACK | Freq: Every day | ORAL | Status: DC | PRN

## 2017-07-15 MED ORDER — HYDROXYZINE HCL 25 MG PO TABS: 25.0000 mg | ORAL_TABLET | Freq: Three times a day (TID) | ORAL | Status: DC | PRN

## 2017-07-15 MED ORDER — INSULIN ASPART 100 UNIT/ML ~~LOC~~ SOLN: 0.0000 [IU] | Freq: Every day | SUBCUTANEOUS | Status: DC

## 2017-07-15 MED ORDER — BACLOFEN 10 MG PO TABS
10.0000 mg | ORAL_TABLET | Freq: Three times a day (TID) | ORAL | Status: DC
  Administered 2017-07-15 – 2017-07-16 (×4): 10 mg via ORAL
  Filled 2017-07-15 (×5): qty 1

## 2017-07-15 NOTE — Progress Notes (Signed)
[facility] Progress Note  Patient: Melissa Myers OEV:[id number]   PCP: [name], MD DOB: [birth year]   DOA: 07/14/2017   DOS: 07/15/2017   Date of Service: the patient was seen and examined on 07/15/2017  Subjective: No nausea no vomiting.  Had fever this morning.  No diarrhea reported as well.  Brief hospital course: Pt. with PMH of pancreatic cancer, HTN, diastolic CHF, chronic pain restless leg syndrome; admitted on 07/14/2017, presented with complaint of back pain, was found to have back spasm causing uncontrolled pain. Currently further plan is continue current pain control regimen.  Assessment and Plan: 1.  Intractable acute on chronic back pain. Initially in the past patient had pain radiating to the right side, this time the pain was radiating to the left side.  Currently no radiation Earlier admission MRI with and without contrast for lumbar spine shows severe foraminal stenosis L4 L5 and L5-S1. CT femur left negative for any acute fracture. This admission pelvic x ray is negative We will continue with Lyrica 75 mg twice daily, baclofen 5 mg twice daily.  Continue IV Dilaudid PRN PT consult  2.  Chronic toxic encephalopathy. Likely in the setting of medications. Primarily polypharmacy due to multiple psychotropic medications. monitor.  3.  Pancreatic cancer.  Stage Ib Hyperbilirubinemia  Recently taken off of chemotherapy due to low benefit. Oncology recommends hospice for the patient.  [facility] has been consulted. Patient has developed some increase in bilirubin as well as jaundice.  Has some itching with the back. Palliative stenting can be considered although if symptoms are not significant for now.  Monitor.  4.  Chronic diastolic CHF. Patient on torsemide Coreg, and Aldactone at home. Also on 81 mg aspirin at home. Currently holding torsemide and Aldactone, continue the rest of the medication.  5.  Restless leg syndrome. Requip been  discontinued.  Will monitor.  6.  Type 2 diabetes mellitus. Blood sugars are elevated. Continue Lantus, change sliding scale from sensitive to moderate.  Also add coverage.  7.Cardiac murmur. No significant abnormality echocardiogram, mild MS  Diet: Cardiac and carb modified diet, aspiration precaution. DVT Prophylaxis: subcutaneous Heparin  Advance goals of care discussion: DNR/DNI, [facility] [place] was considered.  Family refused inpatient hospice for pain management and would like to go home with hospice.  Will discuss further goals of care for clarity of management  Family Communication: family was present at bedside, at the time of interview.  Disposition:  Discharge to SNF versus home with hospice  Consultants: none Procedures: none  Antibiotics: Anti-infectives (From admission, onward)   None       Objective: Physical Exam: Vitals:   07/14/17 2212 07/15/17 0001 07/15/17 0639 07/15/17 1307  BP: (!) 124/55 (!) 129/57 (!) 147/58 (!) 129/55  Pulse: (!) 53 (!) 53 (!) 55 65  Resp: 18 20 19 20   Temp:  98.7 F (37.1 C) 98.8 F (37.1 C) 98.1 F (36.7 C)  TempSrc:  Oral Oral Oral  SpO2: 91% 97% 98% 96%  Weight:  85.8 kg (189 lb 1.6 oz)    Height:  5' (1.524 m)      Intake/Output Summary (Last 24 hours) at 07/15/2017 1757 Last data filed at 07/15/2017 1300 Gross per 24 hour  Intake 960 ml  Output 800 ml  Net 160 ml   Filed Weights   07/15/17 0001  Weight: 85.8 kg (189 lb 1.6 oz)   General: Alert, Awake and Oriented to Place and Person. Appear in mild  distress, affect appropriate eyes: PERRL, mild icterus ENT: Oral Mucosa clear moist. Neck: no JVD, no Abnormal Mass Or lumps Cardiovascular: S1 and S2 Present, aortic systolic Murmur, Peripheral Pulses Present Respiratory: normal respiratory effort, Bilateral Air entry equal and Decreased, no use of accessory muscle, Clear to Auscultation, no Crackles, no wheezes Abdomen: Bowel Sound pesent, Soft and no  tenderness, no hernia Skin: no redness, no Rash, no induration Extremities: no Pedal edema, no calf tenderness Localized tenderness at bilateral hip joint and gluteal region.  Significant pain on flexion of bilateral knee.  Right more than left. Neurologic: Grossly no focal neuro deficit. Bilaterally Equal motor strength  Data Reviewed: CBC: Recent Labs  Lab 07/12/17 1108 07/14/17 2006  WBC 14.6* 11.5*  NEUTROABS 12.2* 8.8*  HGB 11.6 11.1*  HCT 36.2 34.4*  MCV 78.8* 79.4  PLT 318 998   Basic Metabolic Panel: Recent Labs  Lab 07/11/17 1636 07/12/17 1108 07/14/17 2006  NA 135 139 139  K 4.7 4.1 3.6  CL 96* 99 98*  CO2 28 27 27   GLUCOSE 188* 183* 138*  BUN 39* 41* 37*  CREATININE 1.10* 1.35* 0.98  CALCIUM 9.0 9.4 8.9    Liver Function Tests: Recent Labs  Lab 07/12/17 1108 07/14/17 2006  AST 81* 43*  ALT 41 27  ALKPHOS 821* 563*  BILITOT 2.5* 4.6*  PROT 7.5 7.1  ALBUMIN 2.7* 2.6*   No results for input(s): LIPASE, AMYLASE in the last 168 hours. No results for input(s): AMMONIA in the last 168 hours. Coagulation Profile: Recent Labs  Lab 07/14/17 2006  INR 1.10   Cardiac Enzymes: No results for input(s): CKTOTAL, CKMB, CKMBINDEX, TROPONINI in the last 168 hours. BNP (last 3 results) No results for input(s): PROBNP in the last 8760 hours. CBG: No results for input(s): GLUCAP in the last 168 hours. Studies: Dg Pelvis 1-2 Views  Result Date: 07/14/2017 CLINICAL DATA:  Left hip pain and back pain after a fall. History of pancreatic cancer. EXAM: PELVIS - 1-2 VIEW COMPARISON:  06/17/2017 FINDINGS: There is no evidence of pelvic fracture or diastasis. No pelvic bone lesions are seen. Mild degenerative changes in the hips. IMPRESSION: No acute bony abnormalities demonstrated. Electronically Signed   By: Lucienne Capers M.D.   On: 07/14/2017 23:52    Scheduled Meds: . baclofen  10 mg Oral TID  . carvedilol  25 mg Oral BID WC  . enoxaparin (LOVENOX) injection   40 mg Subcutaneous QHS  . [START ON 07/16/2017] insulin aspart  0-15 Units Subcutaneous TID WC  . insulin aspart  0-5 Units Subcutaneous QHS  . insulin aspart  0-9 Units Subcutaneous TID WC  . [START ON 07/16/2017] insulin aspart  3 Units Subcutaneous TID WC  . insulin glargine  10 Units Subcutaneous Daily  . lipase/protease/amylase  144,000 Units Oral TID WC  . mirtazapine  15 mg Oral QHS  . pregabalin  75 mg Oral BID  . sertraline  50 mg Oral Daily   Continuous Infusions:  PRN Meds: camphor-menthol, HYDROmorphone (DILAUDID) injection, hydrOXYzine, ondansetron **OR** ondansetron (ZOFRAN) IV, polyethylene glycol  Time spent: 35 minutes  Author: Berle Mull, MD [facility] Pager: [phone] 07/15/2017 5:57 PM  If 7PM-7AM, please contact night-coverage at [email or web address], password [facility]

## 2017-07-15 NOTE — Consult Note (Signed)
[place]: Met with Pt and Husband at bedside to review hospice services and discuss goals of care. Pt would like to go home after discharge and enroll in full hospice homecare services. Husband voices concern about ongoing back pain. Made aware that [facility] would come out to her home as soon as she is ready for discharge whether that is tomorrow or Monday. We are prepared to enroll her when ready as she is approved for homecare services. Offered inpatient hospice for pain management but declined. Will plan to monitor status and admit upon discharge.  Please feel free to call with any questions or needs. Thank you for the opportunity to work together on this case. Doroteo Glassman, RN [facility] [phone].

## 2017-07-15 NOTE — Evaluation (Signed)
Physical Therapy Evaluation Patient Details Name: Melissa Myers MRN: [id number] DOB: [birth year] Today's Date: 07/15/2017   History of Present Illness  72yo female presenting to the ED due to pain in her R upper buttock that started in the morning; note recent admission between 06/16/17-06/20/17 due to similar back pain. Diagnosed with intractable pain possibly related to her back or her pancreatic cancer. Note L4-L5 severe canal stenosis, L5-S1 severe foraminal stenosis, pelvic x-ray negative for acute abnormality. PMH pancreatic CA, chronic diarrhea, deafness L ear, CHF, lumbar disc degeneration, neuropathy, home O2 use, DM   Clinical Impression   Patient received in bed with family present, very pleasant and willing to attempt PT today; of note she reports she received pain medication about an hour ago and does not have much pain laying still. She does require MaxA for rolling and experiences severe pain even with basic mobility today; attempted supine to sit with assist of +1 but patient ultimately declined due to increasing pain. This evaluation very limited due to high pain levels with mobility. She was left in bed with all needs met, family present this morning. At this point recommend ongoing care in the ST-SNF setting unless needs are able to be met in hospice (referral placed by MD per H&P note); due to severe pain and significant difficulty with functional mobility, do not feel that patient would be safe to return home with assistance of family members only.     Follow Up Recommendations SNF    Equipment Recommendations  Other (comment)(defer to next venue )    Recommendations for Other Services       Precautions / Restrictions Precautions Precautions: Fall Restrictions Weight Bearing Restrictions: No      Mobility  Bed Mobility Overal bed mobility: Needs Assistance Bed Mobility: Rolling;Supine to Sit Rolling: Max assist   Supine to sit: Total assist     General bed  mobility comments: MaxAx1 for rolling due to pain; attempted supine to sit with assist of +1 but unable due to pain and patient requesting to stop   Transfers                 General transfer comment: unable to assess   Ambulation/Gait             General Gait Details: unable to assess   Stairs            Wheelchair Mobility    Modified Rankin (Stroke Patients Only)       Balance Overall balance assessment: Needs assistance                                           Pertinent Vitals/Pain Pain Assessment: Faces Faces Pain Scale: Hurts worst Pain Location: in back; no pain at rest, but quickly spikes to 10/10 with attempted movement  Pain Descriptors / Indicators: Sharp;Moaning;Grimacing Pain Intervention(s): Limited activity within patient's tolerance;Monitored during session;Repositioned;Premedicated before session    Home Living Family/patient expects to be discharged to:: Private residence Living Arrangements: Spouse/significant other Available Help at Discharge: Family;Available 24 hours/day Type of Home: House Home Access: Level entry     Home Layout: One level Home Equipment: Walker - 2 wheels;Bedside commode;Shower seat      Prior Function Level of Independence: Independent with assistive device(s)               Hand Dominance  Extremity/Trunk Assessment   Upper Extremity Assessment Upper Extremity Assessment: Defer to OT evaluation    Lower Extremity Assessment Lower Extremity Assessment: Generalized weakness;RLE deficits/detail;LLE deficits/detail RLE Deficits / Details: grossly 2-3/5  RLE: Unable to fully assess due to pain LLE Deficits / Details: grossly 2-3/5  LLE: Unable to fully assess due to pain    Cervical / Trunk Assessment Cervical / Trunk Assessment: Kyphotic  Communication   Communication: No difficulties  Cognition Arousal/Alertness: Awake/alert Behavior During Therapy: Anxious;WFL  for tasks assessed/performed Overall Cognitive Status: Within Functional Limits for tasks assessed                                        General Comments      Exercises     Assessment/Plan    PT Assessment Patient needs continued PT services  PT Problem List Decreased strength;Decreased mobility;Decreased safety awareness;Decreased coordination;Obesity;Decreased activity tolerance;Decreased balance;Pain       PT Treatment Interventions DME instruction;Therapeutic activities;Gait training;Therapeutic exercise;Patient/family education;Stair training;Balance training;Functional mobility training;Neuromuscular re-education;Manual techniques    PT Goals (Current goals can be found in the Care Plan section)  Acute Rehab PT Goals Patient Stated Goal: have less pain  PT Goal Formulation: With patient/family Time For Goal Achievement: 07/29/17 Potential to Achieve Goals: Fair    Frequency Min 2X/week   Barriers to discharge        Co-evaluation               AM-PAC PT "6 Clicks" Daily Activity  Outcome Measure Difficulty turning over in bed (including adjusting bedclothes, sheets and blankets)?: Unable Difficulty moving from lying on back to sitting on the side of the bed? : Unable Difficulty sitting down on and standing up from a chair with arms (e.g., wheelchair, bedside commode, etc,.)?: Unable Help needed moving to and from a bed to chair (including a wheelchair)?: Total Help needed walking in hospital room?: Total Help needed climbing 3-5 steps with a railing? : Total 6 Click Score: 6    End of Session   Activity Tolerance: Patient limited by pain Patient left: in bed;with bed alarm set;with family/visitor present;with call bell/phone within reach   PT Visit Diagnosis: Unsteadiness on feet (R26.81);Muscle weakness (generalized) (M62.81);Difficulty in walking, not elsewhere classified (R26.2);Other abnormalities of gait and mobility  (R26.89);Pain Pain - Right/Left: (back pain ) Pain - part of body: (back pain )    Time: 2419-9144 PT Time Calculation (min) (ACUTE ONLY): 12 min   Charges:   PT Evaluation $PT Eval Moderate Complexity: 1 Mod     PT G Codes:        Deniece Ree PT, DPT, CBIS  Supplemental Physical Therapist [place]   Pager [phone]

## 2017-07-16 ENCOUNTER — Observation Stay (HOSPITAL_COMMUNITY): Payer: PPO

## 2017-07-16 DIAGNOSIS — M25552 Pain in left hip: Secondary | ICD-10-CM | POA: Diagnosis present

## 2017-07-16 DIAGNOSIS — H9192 Unspecified hearing loss, left ear: Secondary | ICD-10-CM | POA: Diagnosis present

## 2017-07-16 DIAGNOSIS — Z8249 Family history of ischemic heart disease and other diseases of the circulatory system: Secondary | ICD-10-CM | POA: Diagnosis not present

## 2017-07-16 DIAGNOSIS — F329 Major depressive disorder, single episode, unspecified: Secondary | ICD-10-CM | POA: Diagnosis present

## 2017-07-16 DIAGNOSIS — Z8041 Family history of malignant neoplasm of ovary: Secondary | ICD-10-CM | POA: Diagnosis not present

## 2017-07-16 DIAGNOSIS — R509 Fever, unspecified: Secondary | ICD-10-CM | POA: Diagnosis not present

## 2017-07-16 DIAGNOSIS — G4733 Obstructive sleep apnea (adult) (pediatric): Secondary | ICD-10-CM | POA: Diagnosis present

## 2017-07-16 DIAGNOSIS — E1151 Type 2 diabetes mellitus with diabetic peripheral angiopathy without gangrene: Secondary | ICD-10-CM | POA: Diagnosis present

## 2017-07-16 DIAGNOSIS — I5032 Chronic diastolic (congestive) heart failure: Secondary | ICD-10-CM | POA: Diagnosis present

## 2017-07-16 DIAGNOSIS — E785 Hyperlipidemia, unspecified: Secondary | ICD-10-CM | POA: Diagnosis present

## 2017-07-16 DIAGNOSIS — I872 Venous insufficiency (chronic) (peripheral): Secondary | ICD-10-CM | POA: Diagnosis present

## 2017-07-16 DIAGNOSIS — G92 Toxic encephalopathy: Secondary | ICD-10-CM | POA: Diagnosis present

## 2017-07-16 DIAGNOSIS — Z66 Do not resuscitate: Secondary | ICD-10-CM | POA: Diagnosis present

## 2017-07-16 DIAGNOSIS — Z801 Family history of malignant neoplasm of trachea, bronchus and lung: Secondary | ICD-10-CM | POA: Diagnosis not present

## 2017-07-16 DIAGNOSIS — M5414 Radiculopathy, thoracic region: Secondary | ICD-10-CM | POA: Diagnosis present

## 2017-07-16 DIAGNOSIS — Z800 Family history of malignant neoplasm of digestive organs: Secondary | ICD-10-CM | POA: Diagnosis not present

## 2017-07-16 DIAGNOSIS — G8929 Other chronic pain: Secondary | ICD-10-CM | POA: Diagnosis present

## 2017-07-16 DIAGNOSIS — G9340 Encephalopathy, unspecified: Secondary | ICD-10-CM | POA: Diagnosis present

## 2017-07-16 DIAGNOSIS — C250 Malignant neoplasm of head of pancreas: Secondary | ICD-10-CM | POA: Diagnosis present

## 2017-07-16 DIAGNOSIS — R52 Pain, unspecified: Secondary | ICD-10-CM | POA: Diagnosis not present

## 2017-07-16 DIAGNOSIS — Z803 Family history of malignant neoplasm of breast: Secondary | ICD-10-CM | POA: Diagnosis not present

## 2017-07-16 DIAGNOSIS — I110 Hypertensive heart disease with heart failure: Secondary | ICD-10-CM | POA: Diagnosis present

## 2017-07-16 DIAGNOSIS — M48061 Spinal stenosis, lumbar region without neurogenic claudication: Secondary | ICD-10-CM | POA: Diagnosis present

## 2017-07-16 DIAGNOSIS — K219 Gastro-esophageal reflux disease without esophagitis: Secondary | ICD-10-CM | POA: Diagnosis present

## 2017-07-16 DIAGNOSIS — G2581 Restless legs syndrome: Secondary | ICD-10-CM | POA: Diagnosis present

## 2017-07-16 DIAGNOSIS — Z794 Long term (current) use of insulin: Secondary | ICD-10-CM | POA: Diagnosis not present

## 2017-07-16 DIAGNOSIS — R4020 Unspecified coma: Secondary | ICD-10-CM | POA: Diagnosis not present

## 2017-07-16 DIAGNOSIS — Z515 Encounter for palliative care: Secondary | ICD-10-CM | POA: Diagnosis present

## 2017-07-16 DIAGNOSIS — K589 Irritable bowel syndrome without diarrhea: Secondary | ICD-10-CM | POA: Diagnosis present

## 2017-07-16 LAB — COMPREHENSIVE METABOLIC PANEL
ALK PHOS: 487 U/L — AB (ref 38–126)
ALT: 18 U/L (ref 14–54)
ANION GAP: 13 (ref 5–15)
AST: 25 U/L (ref 15–41)
Albumin: 2.4 g/dL — ABNORMAL LOW (ref 3.5–5.0)
BILIRUBIN TOTAL: 3.1 mg/dL — AB (ref 0.3–1.2)
BUN: 41 mg/dL — ABNORMAL HIGH (ref 6–20)
CALCIUM: 8.5 mg/dL — AB (ref 8.9–10.3)
CO2: 24 mmol/L (ref 22–32)
Chloride: 97 mmol/L — ABNORMAL LOW (ref 101–111)
Creatinine, Ser: 1.11 mg/dL — ABNORMAL HIGH (ref 0.44–1.00)
GFR, EST AFRICAN AMERICAN: 56 mL/min — AB (ref >60)
GFR, EST NON AFRICAN AMERICAN: 48 mL/min — AB (ref >60)
Glucose, Bld: 325 mg/dL — ABNORMAL HIGH (ref 65–99)
POTASSIUM: 3 mmol/L — AB (ref 3.5–5.1)
Sodium: 134 mmol/L — ABNORMAL LOW (ref 135–145)
TOTAL PROTEIN: 6.8 g/dL (ref 6.5–8.1)

## 2017-07-16 LAB — BLOOD GAS, ARTERIAL
Acid-Base Excess: 2.3 mmol/L — ABNORMAL HIGH (ref 0.0–2.0)
Bicarbonate: 26.5 mmol/L (ref 20.0–28.0)
DRAWN BY: 257881
FIO2: 0.21
O2 Saturation: 93.3 %
PCO2 ART: 41 mmHg (ref 32.0–48.0)
PO2 ART: 65.2 mmHg — AB (ref 83.0–108.0)
Patient temperature: 98.2
pH, Arterial: 7.425 (ref 7.350–7.450)

## 2017-07-16 LAB — CBC WITH DIFFERENTIAL/PLATELET
BASOS PCT: 0 %
Basophils Absolute: 0 10*3/uL (ref 0.0–0.1)
Eosinophils Absolute: 0.1 10*3/uL (ref 0.0–0.7)
Eosinophils Relative: 1 %
HEMATOCRIT: 32.2 % — AB (ref 36.0–46.0)
HEMOGLOBIN: 10.2 g/dL — AB (ref 12.0–15.0)
LYMPHS ABS: 2.1 10*3/uL (ref 0.7–4.0)
Lymphocytes Relative: 23 %
MCH: 24.8 pg — AB (ref 26.0–34.0)
MCHC: 31.7 g/dL (ref 30.0–36.0)
MCV: 78.2 fL (ref 78.0–100.0)
MONO ABS: 1.2 10*3/uL — AB (ref 0.1–1.0)
Monocytes Relative: 13 %
NEUTROS ABS: 6 10*3/uL (ref 1.7–7.7)
NEUTROS PCT: 63 %
Platelets: 351 10*3/uL (ref 150–400)
RBC: 4.12 MIL/uL (ref 3.87–5.11)
RDW: 16.3 % — AB (ref 11.5–15.5)
WBC: 9.4 10*3/uL (ref 4.0–10.5)

## 2017-07-16 LAB — URINE CULTURE: Culture: NO GROWTH

## 2017-07-16 MED ORDER — INSULIN GLARGINE 100 UNIT/ML ~~LOC~~ SOLN
25.0000 [IU] | Freq: Every day | SUBCUTANEOUS | Status: DC
  Administered 2017-07-16: 25 [IU] via SUBCUTANEOUS
  Filled 2017-07-16: qty 0.25

## 2017-07-16 MED ORDER — POTASSIUM CHLORIDE CRYS ER 20 MEQ PO TBCR
40.0000 meq | EXTENDED_RELEASE_TABLET | Freq: Once | ORAL | Status: AC
  Administered 2017-07-16: 40 meq via ORAL
  Filled 2017-07-16: qty 2

## 2017-07-16 MED ORDER — INSULIN ASPART 100 UNIT/ML ~~LOC~~ SOLN
0.0000 [IU] | SUBCUTANEOUS | Status: DC
  Administered 2017-07-16 – 2017-07-17 (×3): 3 [IU] via SUBCUTANEOUS
  Administered 2017-07-17: 7 [IU] via SUBCUTANEOUS
  Administered 2017-07-17: 9 [IU] via SUBCUTANEOUS
  Administered 2017-07-18 (×2): 3 [IU] via SUBCUTANEOUS
  Administered 2017-07-18: 1 [IU] via SUBCUTANEOUS

## 2017-07-16 MED ORDER — SENNOSIDES-DOCUSATE SODIUM 8.6-50 MG PO TABS
1.0000 | ORAL_TABLET | Freq: Two times a day (BID) | ORAL | Status: DC
  Administered 2017-07-16: 1 via ORAL
  Filled 2017-07-16: qty 1

## 2017-07-16 MED ORDER — POLYETHYLENE GLYCOL 3350 17 G PO PACK
17.0000 g | PACK | Freq: Every day | ORAL | Status: DC
  Administered 2017-07-16: 17 g via ORAL
  Filled 2017-07-16: qty 1

## 2017-07-16 NOTE — Social Work (Addendum)
CSW received call from Hospice indicating that patient will return home with home care hospice and they will continue to follow.  Elissa Hefty, [place] Clinical Social Worker-Weekend [place] [phone]

## 2017-07-16 NOTE — Progress Notes (Signed)
[facility] Progress Note  Patient: Melissa Myers JQB:[id number]   PCP: [name], MD DOB: 12/17/1945   DOA: 07/14/2017   DOS: 07/16/2017   Date of Service: the patient was seen and examined on 07/16/2017  Subjective: Patient was seen times today. On initial examination in the morning patient was awake, did not have any complaints, no nausea no vomiting chest pain abdominal pain.  Gluteal pain resolved.  No diarrhea reported.  Actually she reported constipation. Later on at around 4 PM, during the family meeting patient was awake again did not have any complaint but drowsy and sleepy during the conversation. At around 6 PM patient become more sleepy and drowsy on my evaluation patient was spontaneously moving all extremities, unable to follow command although opening up her eyes.  Vitals were stable.  Brief hospital course: Pt. with PMH of pancreatic cancer, HTN, diastolic CHF, chronic pain restless leg syndrome; admitted on 07/14/2017, presented with complaint of back pain, was found to have back spasm causing uncontrolled pain. Currently further plan is continue current pain control regimen.  Continue to engage with family regarding goals of care discussion.  Assessment and Plan: 1.  Intractable acute on chronic back pain. Patient presents to the hospital with complaints of gluteal pain.  Last admission it was pain radiating to her left side.  This admission on 5/24 is pain radiating to her right side. 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  CT femur left negative for any acute fracture. This admission pelvic x ray is negative. Unable to explain any organic reason for patient's pain, possibility of radiculopathy from thoracic spine cannot be ruled out. Patient's home regimen of Lyrica 75 mg twice daily, baclofen 5 mg twice daily continued.  And patient was given IV Dilaudid PRN PT consult  recommended SNF. At present patient's pain is totally resolved.  Requested RN to mobilize the patient.  2.  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 Likely in the setting of medications. Primarily polypharmacy due to multiple psychotropic medications. Patient had similar event on June 18, 2017 during her last admission On my evaluation patient is not responsive, spontaneously moving all extremities, received baclofen at noon, Lyrica at noon, Zoloft at 1030. Dilaudid was given at midnight. I will get stat CT head, stat ABG, check EKG, check CMP and ammonia level. We will keep the patient n.p.o. Discontinue all psychotropic medication. We will reevaluate the patient in 30 minutes.  3.  Pancreatic cancer.  Stage Ib Hyperbilirubinemia  Recently taken off of chemotherapy due to low benefit. Oncology recommends hospice for the patient.  [facility] has been consulted. Patient has developed some increase in bilirubin as well as jaundice.  Has some itching with the back. Palliative stenting can be considered although symptoms are not significant for now.  Monitor. Family has difficulty deciding goals of care. Had a lengthy discussion with daughter, son, husband at bedside. We will get palliative care to assist with goals of care discussion. Explained to patient's family that patient has a terminal illness of pancreatic cancer with rising CA-19-9 level and intractable gluteal back pain with poor quality of life as lack of pain control medication causes uncontrolled pain and continuing pain control medication causes acute on chronic encephalopathy. Currently family is probably considering transition to SNF and not going with hospice.  4.  Chronic diastolic CHF. Patient on torsemide Coreg, and Aldactone at home. Also on 81 mg aspirin at  home. Currently holding torsemide and Aldactone, as well as rest of the medication.  5.  Restless leg syndrome. Requip been discontinued.  Will  monitor.  6.  Type 2 diabetes mellitus. Blood sugars are elevated. While the patient will be n.p.o., I will hold Lantus, change sliding scale from ACHS to every 4 hours sensitive.   7.Cardiac murmur. No significant abnormality echocardiogram, mild MS  8.  Fever of unknown origin. Patient had a temp of 101.2 this morning. Chest x-ray examination clear.  Patient does not have any acute complaint. Urine analysis already negative for any infection. We will check blood cultures. CBC does not show any evidence of leukocytosis in fact actually it is better. Bilirubin level also trending down. We will monitor for now.  Diet: N.p.o. DVT Prophylaxis: subcutaneous Heparin  Advance goals of care discussion: DNR/DNI, [facility] [place] was considered.  Family refused inpatient hospice for pain management and would like to go home with hospice.  Will discuss further goals of care for clarity of management, palliative care consulted  Family Communication: family was present at bedside, at the time of interview.  Patient provided permission discussed with the family. All questions answered satisfactorily.  Disposition:  Discharge to SNF versus home with hospice  Consultants: none Procedures: none  Antibiotics: Anti-infectives (From admission, onward)   None       Objective: Physical Exam: Vitals:   07/15/17 2155 07/16/17 0529 07/16/17 1644 07/16/17 1747  BP: (!) 155/62 (!) 139/53 (!) 150/61 (!) 145/57  Pulse: 66 (!) 57 (!) 54   Resp: 20 16 17    Temp: (!) 101.2 F (38.4 C) 99.1 F (37.3 C) 98.1 F (36.7 C) 98.2 F (36.8 C)  TempSrc: Oral Oral Oral Axillary  SpO2: 96% 96% 96% 97%  Weight:      Height:        Intake/Output Summary (Last 24 hours) at 07/16/2017 1836 Last data filed at 07/16/2017 1306 Gross per 24 hour  Intake 1310 ml  Output -  Net 1310 ml   Filed Weights   07/15/17 0001  Weight: 85.8 kg (189 lb 1.6 oz)   General: Alert, Awake and Oriented to Place and  Person. Appear in mild distress, affect appropriate eyes: PERRL, mild icterus ENT: Oral Mucosa clear moist. Neck: no JVD, no Abnormal Mass Or lumps Cardiovascular: S1 and S2 Present, aortic systolic Murmur, Peripheral Pulses Present Respiratory: normal respiratory effort, Bilateral Air entry equal and Decreased, no use of accessory muscle, Clear to Auscultation, no Crackles, no wheezes Abdomen: Bowel Sound pesent, Soft and no tenderness, no hernia Skin: no redness, no Rash, no induration Extremities: no Pedal edema, no calf tenderness Localized tenderness at bilateral hip joint and gluteal region.  Significant pain on flexion of bilateral knee.  Right more than left. Neurologic: Grossly no focal neuro deficit. Bilaterally Equal motor strength  Repeat examination at 6:30 PM. Mental status drowsy and lethargic, spontaneously moving all extremities, speech incoherent, attention abnormal,  Cranial Nerves PERRL, EOM normal and present Motor strength spontaneously moving bilateral extremities Sensation present to withdraws to painful stimuli reflexes present biceps, babinski equivocal,  Proprioception Cerebellar test unable to perform   Data Reviewed: CBC: Recent Labs  Lab 07/12/17 1108 07/14/17 2006 07/16/17 1030  WBC 14.6* 11.5* 9.4  NEUTROABS 12.2* 8.8* 6.0  HGB 11.6 11.1* 10.2*  HCT 36.2 34.4* 32.2*  MCV 78.8* 79.4 78.2  PLT 318 342 720   Basic Metabolic Panel: Recent Labs  Lab 07/11/17 1636 07/12/17 1108 07/14/17 2006 07/16/17  1030  NA 135 139 139 134*  K 4.7 4.1 3.6 3.0*  CL 96* 99 98* 97*  CO2 28 27 27 24   GLUCOSE 188* 183* 138* 325*  BUN 39* 41* 37* 41*  CREATININE 1.10* 1.35* 0.98 1.11*  CALCIUM 9.0 9.4 8.9 8.5*    Liver Function Tests: Recent Labs  Lab 07/12/17 1108 07/14/17 2006 07/16/17 1030  AST 81* 43* 25  ALT 41 27 18  ALKPHOS 821* 563* 487*  BILITOT 2.5* 4.6* 3.1*  PROT 7.5 7.1 6.8  ALBUMIN 2.7* 2.6* 2.4*   No results for input(s): LIPASE,  AMYLASE in the last 168 hours. No results for input(s): AMMONIA in the last 168 hours. Coagulation Profile: Recent Labs  Lab 07/14/17 2006  INR 1.10   Cardiac Enzymes: No results for input(s): CKTOTAL, CKMB, CKMBINDEX, TROPONINI in the last 168 hours. BNP (last 3 results) No results for input(s): PROBNP in the last 8760 hours. CBG: No results for input(s): GLUCAP in the last 168 hours. Studies: Dg Chest Port 1 View  Result Date: 07/16/2017 CLINICAL DATA:  Fever EXAM: PORTABLE CHEST 1 VIEW COMPARISON:  June 19, 2017 FINDINGS: No edema or consolidation. Heart is upper normal in size with pulmonary vascularity normal. There is aortic atherosclerosis. No adenopathy. Port-A-Cath tip is in the superior vena cava near the cavoatrial junction. No pneumothorax. There is calcific tendinosis in the lateral right shoulder. IMPRESSION: No edema or consolidation. Port-A-Cath position unchanged. There is aortic atherosclerosis. Aortic Atherosclerosis (ICD10-I70.0). Electronically Signed   By: Lowella Grip III M.D.   On: 07/16/2017 16:52    Scheduled Meds: . carvedilol  25 mg Oral BID WC  . enoxaparin (LOVENOX) injection  40 mg Subcutaneous QHS  . insulin aspart  0-9 Units Subcutaneous Q4H   Continuous Infusions:  PRN Meds: camphor-menthol, ondansetron **OR** ondansetron (ZOFRAN) IV  Time spent: The patient is critically ill with multiple organ systems failure and requires high complexity decision making for assessment and support, frequent evaluation and titration of therapies. Critical Care Time devoted to patient care services described in this note is 35 minutes   Author: Berle Mull, MD [facility] Pager: [phone] 07/16/2017 6:36 PM  If 7PM-7AM, please contact night-coverage at [email or web address], password [facility]

## 2017-07-16 NOTE — Progress Notes (Signed)
Pt very difficult to wake up. Sternal rub done  for several minutes before pt opened her eyes. She goes right back to sleep. Daughter at bedside. VS- T-98.2 aux,P-50 BP-145/57,O2-97% ra.  Dr. Posey Pronto called. He will change the sliding scale for insulin. Will continue to monitor.

## 2017-07-17 LAB — COMPREHENSIVE METABOLIC PANEL
ALBUMIN: 2.3 g/dL — AB (ref 3.5–5.0)
ALT: 14 U/L (ref 14–54)
ANION GAP: 12 (ref 5–15)
AST: 22 U/L (ref 15–41)
Alkaline Phosphatase: 441 U/L — ABNORMAL HIGH (ref 38–126)
BUN: 43 mg/dL — ABNORMAL HIGH (ref 6–20)
CO2: 24 mmol/L (ref 22–32)
Calcium: 8.6 mg/dL — ABNORMAL LOW (ref 8.9–10.3)
Chloride: 101 mmol/L (ref 101–111)
Creatinine, Ser: 0.98 mg/dL (ref 0.44–1.00)
GFR calc Af Amer: >60 mL/min (ref >60)
GFR calc non Af Amer: 56 mL/min — ABNORMAL LOW (ref >60)
GLUCOSE: 245 mg/dL — AB (ref 65–99)
POTASSIUM: 3.5 mmol/L (ref 3.5–5.1)
SODIUM: 137 mmol/L (ref 135–145)
TOTAL PROTEIN: 6.6 g/dL (ref 6.5–8.1)
Total Bilirubin: 2.4 mg/dL — ABNORMAL HIGH (ref 0.3–1.2)

## 2017-07-17 LAB — AMMONIA: Ammonia: 37 umol/L — ABNORMAL HIGH (ref 9–35)

## 2017-07-17 MED ORDER — INSULIN ASPART PROT & ASPART (70-30 MIX) 100 UNIT/ML ~~LOC~~ SUSP
20.0000 [IU] | Freq: Once | SUBCUTANEOUS | Status: AC
  Administered 2017-07-17: 20 [IU] via SUBCUTANEOUS
  Filled 2017-07-17: qty 10

## 2017-07-17 MED ORDER — INSULIN ASPART 100 UNIT/ML ~~LOC~~ SOLN
10.0000 [IU] | Freq: Once | SUBCUTANEOUS | Status: AC
  Administered 2017-07-17: 10 [IU] via SUBCUTANEOUS

## 2017-07-17 MED ORDER — ACETAMINOPHEN 325 MG PO TABS
650.0000 mg | ORAL_TABLET | Freq: Four times a day (QID) | ORAL | Status: DC | PRN
Start: 2017-07-17 — End: 2017-07-18
  Administered 2017-07-17: 650 mg via ORAL
  Filled 2017-07-17: qty 2

## 2017-07-17 MED ORDER — HYDROCODONE-ACETAMINOPHEN 5-325 MG PO TABS
1.0000 | ORAL_TABLET | Freq: Four times a day (QID) | ORAL | Status: DC | PRN
  Administered 2017-07-17: 1 via ORAL
  Filled 2017-07-17 (×3): qty 1

## 2017-07-17 NOTE — Progress Notes (Signed)
[facility] Progress Note  Patient: Melissa Myers [id number]:[id number]   PCP: [name], MD DOB: 1945-11-16   DOA: 07/14/2017   DOS: 07/17/2017   Date of Service: the patient was seen and examined on 07/17/2017  Subjective: No acute events.  Patient remains sleepy this morning although yesterday after her initial event with an-patient was awake and did not have any acute complaint.  Brief hospital course: Pt. with PMH of pancreatic cancer, HTN, diastolic CHF, chronic pain restless leg syndrome; admitted on 07/14/2017, presented with complaint of back pain, was found to have back spasm causing uncontrolled pain. Currently further plan is continue current pain control regimen.  Continue to engage with family regarding goals of care discussion.  Assessment and Plan: 1.  Intractable acute on chronic back pain. Patient presents to the hospital with complaints of gluteal pain.  Last admission it was pain radiating to her left side.  This admission on 5/24 is pain radiating to her right side. 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  CT femur left negative for any acute fracture. This admission pelvic x ray is negative. Unable to explain any organic reason for patient's pain, possibility of radiculopathy from thoracic spine cannot be ruled out. Patient's home regimen of Lyrica 75 mg twice daily, baclofen 5 mg twice daily continued.  And patient was given IV Dilaudid PRN PT consult recommended SNF. At present patient's pain is totally resolved.  Requested RN to mobilize the patient.  2.  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 Likely in the setting of medications. Primarily polypharmacy due to multiple psychotropic medications. Patient had similar event on June 18, 2017 during her last admission On my evaluation patient is not responsive, spontaneously moving all extremities, received baclofen at noon, Lyrica at  noon, Zoloft at 1030. Dilaudid was given at midnight. CT head unremarkable, ABG unremarkable. EKG unremarkable. Labs were stable as well. Likely polypharmacy. Advance patient's diet.  3.  Pancreatic cancer.  Stage Ib Hyperbilirubinemia  Recently taken off of chemotherapy due to low benefit. Oncology recommends hospice for the patient.  [facility] has been consulted. Patient has developed some increase in bilirubin as well as jaundice.  Has some itching with the back. Palliative stenting can be considered although symptoms are not significant for now.  Monitor. Family has difficulty deciding goals of care. Had a lengthy discussion with daughter, son, husband at bedside. We will get palliative care to assist with goals of care discussion. Explained to patient's family that patient has a terminal illness of pancreatic cancer with rising CA-19-9 level and intractable gluteal back pain with poor quality of life as lack of pain control medication causes uncontrolled pain and continuing pain control medication causes acute on chronic encephalopathy. Currently family is considering transition to home with hospice.  4.  Chronic diastolic CHF. Patient on torsemide Coreg, and Aldactone at home. Also on 81 mg aspirin at home. Currently holding torsemide and Aldactone, as well as rest of the medication.  5.  Restless leg syndrome. Requip been discontinued.  Will monitor.  6.  Type 2 diabetes mellitus. Blood sugars are elevated. While the patient will be n.p.o., I will hold Lantus, change sliding scale from ACHS to every 4 hours sensitive.   7.Cardiac murmur. No significant abnormality echocardiogram, mild MS  8.  Fever of unknown origin. Patient had a temp of 101.2 this morning. Chest x-ray examination clear.  Patient does not have any acute  complaint. Urine analysis already negative for any infection. No growth and blood culture.  Bilirubin level continue to trend down.  Diet:  N.p.o. DVT Prophylaxis: subcutaneous Heparin  Advance goals of care discussion: DNR/DNI, [facility] [place] was considered.  Family refused inpatient hospice for pain management and would like to go home with hospice.    Family Communication: family was present at bedside, at the time of interview.  Patient provided permission discussed with the family. All questions answered satisfactorily.  Disposition:  Discharge to home with home hospice.  Consultants: none Procedures: none  Antibiotics: Anti-infectives (From admission, onward)   None       Objective: Physical Exam: Vitals:   07/16/17 1644 07/16/17 1747 07/16/17 2127 07/17/17 0552  BP: (!) 150/61 (!) 145/57 (!) 146/56 (!) 134/55  Pulse: (!) 54  (!) 58 (!) 50  Resp: 17  16 16   Temp: 98.1 F (36.7 C) 98.2 F (36.8 C)  98 F (36.7 C)  TempSrc: Oral Axillary  Axillary  SpO2: 96% 97% 94% 96%  Weight:      Height:        Intake/Output Summary (Last 24 hours) at 07/17/2017 1704 Last data filed at 07/17/2017 0843 Gross per 24 hour  Intake 480 ml  Output 900 ml  Net -420 ml   Filed Weights   07/15/17 0001  Weight: 85.8 kg (189 lb 1.6 oz)   General: Alert, Awake and Oriented to Place and Person. Appear in mild distress, affect appropriate eyes: PERRL, mild icterus ENT: Oral Mucosa clear moist. Neck: no JVD, no Abnormal Mass Or lumps Cardiovascular: S1 and S2 Present, aortic systolic Murmur, Peripheral Pulses Present Respiratory: normal respiratory effort, Bilateral Air entry equal and Decreased, no use of accessory muscle, Clear to Auscultation, no Crackles, no wheezes Abdomen: Bowel Sound pesent, Soft and no tenderness, no hernia Skin: no redness, no Rash, no induration Extremities: no Pedal edema, no calf tenderness Localized tenderness at bilateral hip joint and gluteal region.  Significant pain on flexion of bilateral knee.  Right more than left. Neurologic: Grossly no focal neuro deficit. Bilaterally Equal  motor strength  Data Reviewed: CBC: Recent Labs  Lab 07/12/17 1108 07/14/17 2006 07/16/17 1030  WBC 14.6* 11.5* 9.4  NEUTROABS 12.2* 8.8* 6.0  HGB 11.6 11.1* 10.2*  HCT 36.2 34.4* 32.2*  MCV 78.8* 79.4 78.2  PLT 318 342 941   Basic Metabolic Panel: Recent Labs  Lab 07/11/17 1636 07/12/17 1108 07/14/17 2006 07/16/17 1030 07/17/17 0448  NA 135 139 139 134* 137  K 4.7 4.1 3.6 3.0* 3.5  CL 96* 99 98* 97* 101  CO2 28 27 27 24 24   GLUCOSE 188* 183* 138* 325* 245*  BUN 39* 41* 37* 41* 43*  CREATININE 1.10* 1.35* 0.98 1.11* 0.98  CALCIUM 9.0 9.4 8.9 8.5* 8.6*    Liver Function Tests: Recent Labs  Lab 07/12/17 1108 07/14/17 2006 07/16/17 1030 07/17/17 0448  AST 81* 43* 25 22  ALT 41 27 18 14   ALKPHOS 821* 563* 487* 441*  BILITOT 2.5* 4.6* 3.1* 2.4*  PROT 7.5 7.1 6.8 6.6  ALBUMIN 2.7* 2.6* 2.4* 2.3*   No results for input(s): LIPASE, AMYLASE in the last 168 hours. Recent Labs  Lab 07/17/17 0448  AMMONIA 37*   Coagulation Profile: Recent Labs  Lab 07/14/17 2006  INR 1.10   Cardiac Enzymes: No results for input(s): CKTOTAL, CKMB, CKMBINDEX, TROPONINI in the last 168 hours. BNP (last 3 results) No results for input(s): PROBNP in the last  8760 hours. CBG: No results for input(s): GLUCAP in the last 168 hours. Studies: Ct Head Wo Contrast  Result Date: 07/16/2017 CLINICAL DATA:  Altered level of consciousness EXAM: CT HEAD WITHOUT CONTRAST TECHNIQUE: Contiguous axial images were obtained from the base of the skull through the vertex without intravenous contrast. COMPARISON:  February 26, 2017 FINDINGS: Brain: Age related volume loss is stable. There is no intracranial mass, hemorrhage, extra-axial fluid collection, or midline shift. There is mild small vessel disease in the centra semiovale bilaterally. No acute infarct evident. Vascular: No hyperdense vessels. There is calcification in each distal vertebral artery in each carotid siphon region. Skull: Bony  calvarium appears intact. Sinuses/Orbits: There is mucosal thickening in several ethmoid air cells. Other visualized paranasal sinuses are clear. Orbits appear symmetric bilaterally. Other: Mastoid air cells are clear on the right. Opacification in several mastoid air cells on the left is stable. IMPRESSION: Mild periventricular small vessel disease. No acute infarct. No mass or hemorrhage. There are foci of arterial vascular calcification. There is mucosal thickening in several ethmoid air cells. There is mild chronic opacification in several left-sided mastoid air cells. Electronically Signed   By: Lowella Grip III M.D.   On: 07/16/2017 19:10    Scheduled Meds: . carvedilol  25 mg Oral BID WC  . enoxaparin (LOVENOX) injection  40 mg Subcutaneous QHS  . insulin aspart  0-9 Units Subcutaneous Q4H   Continuous Infusions:  PRN Meds: acetaminophen, camphor-menthol, HYDROcodone-acetaminophen, ondansetron **OR** ondansetron (ZOFRAN) IV  Time spent: 35 mins  Author: Berle Mull, MD [facility] Pager: [phone] 07/17/2017 5:04 PM  If 7PM-7AM, please contact night-coverage at [email or web address], password [facility]

## 2017-07-17 NOTE — Consult Note (Signed)
[facility] [place]:  Met with pt's husband and spoke to the oldest daughter over the phone. Will order equipment of hospital bed and OBT to be delivered today.spoke to the MD who is in agreement to discharge pt home tomorrow. Pt spouse and family in agreement with hospice care and will be supporting the pt's spouse to help care for her at home. Pt has equipment of BSC, shower chair, walker, wheelchair and oxygen at home already. Webb Silversmith RN   [phone]

## 2017-07-17 NOTE — Progress Notes (Signed)
Pt's [id number], Dr. Posey Pronto notified and orders given.

## 2017-07-17 NOTE — Progress Notes (Signed)
Nutrition Brief Note   Patient identified via MST screen.  Chart reviewed. Pt now transitioning to comfort care.  No nutrition interventions warranted at this time.    Clayton Bibles, MS, RD, [place] Dietitian Pager: [phone] After Hours Pager: [phone]

## 2017-07-17 NOTE — Progress Notes (Addendum)
CSW following for disposition. Pt/family familiar to CSW from previous admission 05/2017. Pt has been set up with [facility]. Pt lives with family and 3 caregivers assist. Pt's family reportedly expressed interest in SNF placement to medical team. TOday, CSW discussed that with husband- he reports they are undecided about wanting to pursue SNF as it will be out-of-pocket expense for care (as she does want to have hospice services as well). Husband asks that CSW make referrals to area SNFs today in order to investigate options as the family discussed their wishes. Made referrals- will need [facility] and [facility] if SNF decided upon ([facility] closed due to holiday).  Will follow up with family.   Sharren Bridge, MSW, LCSW Clinical Social Work 07/17/2017 [phone]  Received call back from pt's daughter Wells Guiles [phone]. Daughter states, "My dad is overwhelmed and can't remember details so he asked if I would talk with you instead." CSW relayed information above to daughter- she feels that family does not want pt to admit to SNF as goal is for comfort care for pt moving forward. "We'd rather have her at home and go to a hospice facility when she is able than to move to a SNF and then home or somewhere else." States pt "does not have the funds for private pay at a facility." Daughter asked that she be able to coordinate with hospice liaison in order to understand care provided by hospice at home-  Liaison meeting with pt later today and will call daughter to participate.  Updated attending pt plan to [facility].

## 2017-07-18 LAB — GLUCOSE, CAPILLARY
GLUCOSE-CAPILLARY: 107 mg/dL — AB (ref 65–99)
GLUCOSE-CAPILLARY: 121 mg/dL — AB (ref 65–99)
GLUCOSE-CAPILLARY: 186 mg/dL — AB (ref 65–99)
GLUCOSE-CAPILLARY: 214 mg/dL — AB (ref 65–99)
GLUCOSE-CAPILLARY: 239 mg/dL — AB (ref 65–99)
GLUCOSE-CAPILLARY: 482 mg/dL — AB (ref 65–99)
GLUCOSE-CAPILLARY: 499 mg/dL — AB (ref 65–99)
Glucose-Capillary: 207 mg/dL — ABNORMAL HIGH (ref 65–99)
Glucose-Capillary: 213 mg/dL — ABNORMAL HIGH (ref 65–99)
Glucose-Capillary: 237 mg/dL — ABNORMAL HIGH (ref 65–99)
Glucose-Capillary: 296 mg/dL — ABNORMAL HIGH (ref 65–99)
Glucose-Capillary: 304 mg/dL — ABNORMAL HIGH (ref 65–99)
Glucose-Capillary: 392 mg/dL — ABNORMAL HIGH (ref 65–99)
Glucose-Capillary: 450 mg/dL — ABNORMAL HIGH (ref 65–99)
Glucose-Capillary: 460 mg/dL — ABNORMAL HIGH (ref 65–99)
Glucose-Capillary: 193 mg/dL — ABNORMAL HIGH (ref 65–99)
Glucose-Capillary: 247 mg/dL — ABNORMAL HIGH (ref 65–99)
Glucose-Capillary: 352 mg/dL — ABNORMAL HIGH (ref 65–99)

## 2017-07-18 MED ORDER — HEPARIN SOD (PORK) LOCK FLUSH 100 UNIT/ML IV SOLN
500.0000 [IU] | INTRAVENOUS | Status: AC | PRN
  Administered 2017-07-18: 500 [IU]
  Filled 2017-07-18: qty 5

## 2017-07-18 NOTE — Progress Notes (Signed)
Transport setup via [employer].

## 2017-07-18 NOTE — Discharge Summary (Signed)
[facility] Discharge Summary   Patient: Melissa Myers JYN:[id number]   PCP: [name], MD DOB: [birth year]   Date of admission: 07/14/2017   Date of discharge:  07/18/2017    Discharge Diagnoses:  Principal Problem:   Intractable pain Active Problems:   Essential hypertension   Chronic diastolic heart failure (HCC)   Hyperlipidemia   IDDM (insulin dependent diabetes mellitus) ([place])   MDD (major depressive disorder), single episode, severe ([place])   Malignant neoplasm of head of pancreas ([place])   Acute encephalopathy   Admitted From: home Disposition:  Home with hospice  Recommendations for Outpatient Follow-up:  1. Please follow-up with PCP as needed.  Follow-up Information    [name], MD. Call.   Specialty:  Internal Medicine Why:  as needed Contact information: [place] [id number] [phone]          Diet recommendation: Comfort feed  Activity: The patient is advised to gradually reintroduce usual activities.  Discharge Condition: good  Code Status: DNR/DNI, on hospice  History of present illness: As per the H and P dictated on admission, "Melissa Myers is a 72 y.o. female with medical history significant for pancreatic cancer, chronic back pain, DM, depression, who presented to the ED with complaints of pain right upper buttock that started this morning.  Patient has a history of pancreatic cancer, follows with Dr. Burr Medico, current plan are to transition to hospice care.  Chemotherapy was discontinued due to poor performance status.  Patient was called, that patient was jaundiced and her cancer had progressed.  Hospice care was recommended, urgent referral to hospice was made today by patient's oncologist.  All day today patient had not gotten out of bed, because of worsening of her right buttock pain, unable to go to the bathroom.  Patient was recently admitted 4/26-4/30/19 for intractable back pain.  This  time patient denies back pain, reports pain is in her right upper buttock area, aggravated by movement, she denies falls. Endorses Poor p.o. Intake."  Hospital Course:  Summary of her active problems in the hospital is as following. 1.  Intractable acute on chronic back pain. Patient presents to the hospital with complaints of gluteal pain.  Last admission it was pain radiating to her left side.  This admission on 5/24 is pain radiating to her right side. 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  CT femur left negative for any acute fracture. This admission pelvic x ray is negative. Unable to explain any organic reason for patient's pain, possibility of radiculopathy from thoracic spine cannot be ruled out. Patient's home regimen of Lyrica 75 mg twice daily, baclofen 5 mg twice daily continued.  And patient was given IV Dilaudid PRN PT consult recommended SNF.  With hospice. At present patient's pain is totally resolved.  2.  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 Likely in the setting of medications. Primarily polypharmacy due to multiple psychotropic medications. Patient had similar event on June 18, 2017 during her last admission On my evaluation patient is not responsive, spontaneously moving all extremities, received baclofen at noon, Lyrica at noon, Zoloft at 1030. Dilaudid was given at midnight. CT head unremarkable, ABG unremarkable. EKG unremarkable. Labs were stable as well. Likely polypharmacy. Advance patient's diet.  3.  Pancreatic cancer.  Stage Ib Hyperbilirubinemia  Recently taken off of chemotherapy due to low benefit. Oncology recommends hospice for the patient.  [facility]  has been consulted. Patient has developed some increase in bilirubin as well as jaundice.  Has some itching with the back. Palliative stenting can be considered although symptoms are not significant for now.   Monitor. Family has difficulty deciding goals of care. Had a lengthy discussion with daughter, son, husband at bedside. We will get palliative care to assist with goals of care discussion. Explained to patient's family that patient has a terminal illness of pancreatic cancer with rising CA-19-9 level and intractable gluteal back pain with poor quality of life as lack of pain control medication causes uncontrolled pain and continuing pain control medication causes acute on chronic encephalopathy. Currently family is considering transition to home with hospice.  4.  Chronic diastolic CHF. Patient on torsemide Coreg, and Aldactone at home. Also on 81 mg aspirin at home. Discharge.  Hospice can adjusted further.  5.  Restless leg syndrome. Requip been discontinued.  Will monitor.  6.  Type 2 diabetes mellitus. Blood sugars are elevated. To bring patient's home regimen on discharge.  7.Cardiac murmur. No significant abnormality echocardiogram, mild MS  8.  Fever of unknown origin. Patient had a temp of 101.2 this morning. Chest x-ray examination clear.  Patient does not have any acute complaint. Urine analysis already negative for any infection. No growth and blood culture.  Bilirubin level continue to trend down.   All other chronic medical condition were stable during the hospitalization.  Patient was seen by physical therapy, who recommended SNF, family decided home with hospice which was arranged by Education officer, museum and case Freight forwarder. On the day of the discharge the patient's vitals were stable , and no other acute medical condition were reported by patient. the patient was felt safe to be discharge at home with hospice.  Consultants: none Procedures: none  DISCHARGE MEDICATION: Allergies as of 07/18/2017      Reactions   Morphine Other (See Comments), Nausea Only   GI upset and headaches GI upset and headaches   Statins Other (See Comments)   Statin drugs cause muscle pain  / "muscle damage"--was told by MD not to take rhabdomyolysis Statin drugs cause muscle pain / "muscle damage"--was told by MD not to take   Sulfa Antibiotics Diarrhea, Nausea Only   Cymbalta [duloxetine Hcl] Other (See Comments)   Restless leg syndrome Restless leg syndrome, rash, itching, and diarrhea   Sulfasalazine Diarrhea   Levemir [insulin Detemir] Itching   Zinc Swelling, Rash, Hives, Other (See Comments)      Medication List    TAKE these medications   aspirin EC 81 MG tablet Take 1 tablet (81 mg total) by mouth daily.   baclofen 10 MG tablet Commonly known as:  LIORESAL Take 0.5 tablets (5 mg total) by mouth 3 (three) times daily. What changed:  how much to take   carvedilol 25 MG tablet Commonly known as:  COREG TAKE ONE TABLET TWICE DAILY WITH FOOD   docusate sodium 100 MG capsule Commonly known as:  COLACE Take 1 capsule (100 mg total) by mouth 2 (two) times daily.   FREESTYLE LIBRE SENSOR SYSTEM Misc Use to check sugars   HYDROcodone-acetaminophen 5-325 MG tablet Commonly known as:  NORCO/VICODIN Take 1 tablet by mouth every 6 (six) hours as needed for moderate pain or severe pain.   insulin aspart 100 UNIT/ML injection Commonly known as:  novoLOG Inject 0-20 Units into the skin 3 (three) times daily with meals. . CBG 70 - 120: 0 units CBG 121 - 150: 3 units CBG  151 - 200: 4 units CBG 201 - 250: 7 units CBG 251 - 300: 11 units CBG 301 - 350: 15 units CBG 351 - 400: 20 units   insulin glargine 100 UNIT/ML injection Commonly known as:  LANTUS Inject 0.3 mLs (30 Units total) into the skin daily.   lidocaine 5 % Commonly known as:  LIDODERM Place 1 patch daily onto the skin. Remove & Discard patch within 12 hours or as directed by MD   lidocaine-prilocaine cream Commonly known as:  EMLA Apply to port site prior to being accessed   lipase/protease/amylase 36000 UNITS Cpep capsule Commonly known as:  CREON 2 po prior to snacks; 4 po prior to  meals   metFORMIN 500 MG tablet Commonly known as:  GLUCOPHAGE Take 500 mg by mouth daily with breakfast.   mirtazapine 15 MG tablet Commonly known as:  REMERON Take 1 tablet (15 mg total) by mouth at bedtime.   mupirocin ointment 2 % Commonly known as:  BACTROBAN Apply 1 application topically daily.   ondansetron 8 MG tablet Commonly known as:  ZOFRAN Take 1 tablet (8 mg total) by mouth every 8 (eight) hours as needed for nausea or vomiting.   ONETOUCH VERIO test strip Generic drug:  glucose blood USE AS INSTRUCTED UP TO 4 TIMES DAILY   oxyCODONE 5 MG immediate release tablet Commonly known as:  Oxy IR/ROXICODONE Take 1 tablet (5 mg total) by mouth every 6 (six) hours as needed for severe pain.   polyethylene glycol packet Commonly known as:  MIRALAX / GLYCOLAX Take 17 g by mouth daily.   potassium chloride SA 20 MEQ tablet Commonly known as:  K-DUR,KLOR-CON Take 20 mEq by mouth daily.   pregabalin 75 MG capsule Commonly known as:  LYRICA Take 1 capsule (75 mg total) by mouth 2 (two) times daily.   rOPINIRole 2 MG tablet Commonly known as:  REQUIP Take 1 tablet (2 mg total) by mouth 2 (two) times daily.   sertraline 50 MG tablet Commonly known as:  ZOLOFT Take 50 mg by mouth daily.   silver sulfADIAZINE 1 % cream Commonly known as:  SILVADENE Apply 1 application daily topically.   spironolactone 25 MG tablet Commonly known as:  ALDACTONE Take 1 tablet (25 mg total) by mouth daily.   torsemide 100 MG tablet Commonly known as:  DEMADEX Take 50 mg by mouth daily.   traMADol 50 MG tablet Commonly known as:  ULTRAM Take 1 tablet (50 mg total) by mouth every 6 (six) hours as needed. What changed:  reasons to take this   Vitamin D3 2000 units capsule Take 1 capsule (2,000 Units total) by mouth daily.      Allergies  Allergen Reactions  . Morphine Other (See Comments) and Nausea Only    GI upset and headaches GI upset and headaches  . Statins Other  (See Comments)    Statin drugs cause muscle pain / "muscle damage"--was told by MD not to take rhabdomyolysis Statin drugs cause muscle pain / "muscle damage"--was told by MD not to take  . Sulfa Antibiotics Diarrhea and Nausea Only  . Cymbalta [Duloxetine Hcl] Other (See Comments)    Restless leg syndrome Restless leg syndrome, rash, itching, and diarrhea  . Sulfasalazine Diarrhea  . Levemir [Insulin Detemir] Itching  . Zinc Swelling, Rash, Hives and Other (See Comments)   Discharge Instructions    Diet - low sodium heart healthy   Complete by:  As directed    Discharge instructions  Complete by:  As directed    It is important that you read following instructions as well as go over your medication list with RN to help you understand your care after this hospitalization.  Discharge Instructions: Please follow-up with PCP in one week  Please request your primary care physician to go over all Hospital Tests and Procedure/Radiological results at the follow up,  Please get all Hospital records sent to your PCP by signing hospital release before you go home.   Do not take more than prescribed Pain, Sleep and Anxiety Medications. You were cared for by a hospitalist during your hospital stay. If you have any questions about your discharge medications or the care you received while you were in the hospital after you are discharged, you can call the unit and ask to speak with the hospitalist on call if the hospitalist that took care of you is not available.  Once you are discharged, your primary care physician will handle any further medical issues. Please note that NO REFILLS for any discharge medications will be authorized once you are discharged, as it is imperative that you return to your primary care physician (or establish a relationship with a primary care physician if you do not have one) for your aftercare needs so that they can reassess your need for medications and monitor your lab  values. You Must read complete instructions/literature along with all the possible adverse reactions/side effects for all the Medicines you take and that have been prescribed to you. Take any new Medicines after you have completely understood and accept all the possible adverse reactions/side effects. Wear Seat belts while driving. If you have smoked or chewed Tobacco in the last 2 yrs please stop smoking and/or stop any Recreational drug use.   Increase activity slowly   Complete by:  As directed      Discharge Exam: Filed Weights   07/15/17 0001  Weight: 85.8 kg (189 lb 1.6 oz)   Vitals:   07/17/17 2101 07/18/17 0623  BP: (!) 151/53 (!) 147/62  Pulse: 62 (!) 53  Resp: 20 20  Temp: 98.7 F (37.1 C) 98.4 F (36.9 C)  SpO2: 97% 95%   General: Appear in no distress, no Rash; Oral Mucosa moist. Cardiovascular: S1 and S2 Present, no Murmur, no JVD Respiratory: Bilateral Air entry present and Clear to Auscultation, no Crackles, no wheezes Abdomen: Bowel Sound present, Soft and no tenderness Extremities: no Pedal edema, no calf tenderness Neurology: Grossly no focal neuro deficit.  The results of significant diagnostics from this hospitalization (including imaging, microbiology, ancillary and laboratory) are listed below for reference.    Significant Diagnostic Studies: Dg Pelvis 1-2 Views  Result Date: 07/14/2017 CLINICAL DATA:  Left hip pain and back pain after a fall. History of pancreatic cancer. EXAM: PELVIS - 1-2 VIEW COMPARISON:  06/17/2017 FINDINGS: There is no evidence of pelvic fracture or diastasis. No pelvic bone lesions are seen. Mild degenerative changes in the hips. IMPRESSION: No acute bony abnormalities demonstrated. Electronically Signed   By: Lucienne Capers M.D.   On: 07/14/2017 23:52   Ct Head Wo Contrast  Result Date: 07/16/2017 CLINICAL DATA:  Altered level of consciousness EXAM: CT HEAD WITHOUT CONTRAST TECHNIQUE: Contiguous axial images were obtained from  the base of the skull through the vertex without intravenous contrast. COMPARISON:  February 26, 2017 FINDINGS: Brain: Age related volume loss is stable. There is no intracranial mass, hemorrhage, extra-axial fluid collection, or midline shift. There is mild small vessel disease  in the centra semiovale bilaterally. No acute infarct evident. Vascular: No hyperdense vessels. There is calcification in each distal vertebral artery in each carotid siphon region. Skull: Bony calvarium appears intact. Sinuses/Orbits: There is mucosal thickening in several ethmoid air cells. Other visualized paranasal sinuses are clear. Orbits appear symmetric bilaterally. Other: Mastoid air cells are clear on the right. Opacification in several mastoid air cells on the left is stable. IMPRESSION: Mild periventricular small vessel disease. No acute infarct. No mass or hemorrhage. There are foci of arterial vascular calcification. There is mucosal thickening in several ethmoid air cells. There is mild chronic opacification in several left-sided mastoid air cells. Electronically Signed   By: Lowella Grip III M.D.   On: 07/16/2017 19:10   Dg Chest Port 1 View  Result Date: 07/16/2017 CLINICAL DATA:  Fever EXAM: PORTABLE CHEST 1 VIEW COMPARISON:  June 19, 2017 FINDINGS: No edema or consolidation. Heart is upper normal in size with pulmonary vascularity normal. There is aortic atherosclerosis. No adenopathy. Port-A-Cath tip is in the superior vena cava near the cavoatrial junction. No pneumothorax. There is calcific tendinosis in the lateral right shoulder. IMPRESSION: No edema or consolidation. Port-A-Cath position unchanged. There is aortic atherosclerosis. Aortic Atherosclerosis (ICD10-I70.0). Electronically Signed   By: Lowella Grip III M.D.   On: 07/16/2017 16:52   Dg Chest Port 1 View  Result Date: 06/19/2017 CLINICAL DATA:  Fever. EXAM: PORTABLE CHEST 1 VIEW COMPARISON:  02/26/2017 FINDINGS: Heart size and pulmonary  vascularity are normal and the lungs are clear. No infiltrates or effusions. No bone abnormality. Port-A-Cath with the tip in the superior vena cava just below the level of the carina. IMPRESSION: No active disease. Electronically Signed   By: Lorriane Shire M.D.   On: 06/19/2017 08:59    Microbiology: Recent Results (from the past 240 hour(s))  Urine culture     Status: None   Collection Time: 07/14/17  8:06 PM  Result Value Ref Range Status   Specimen Description   Final    URINE, CATHETERIZED Performed at [facility] [address], [place], [place] [address]    Special Requests   Final    NONE Performed at [facility], [place] [address], [place], [place] [address]    Culture   Final    NO GROWTH Performed at [facility], [place] [address], [place], [place] [address]    Report Status 07/16/2017 FINAL  Final  Culture, blood (routine x 2)     Status: None (Preliminary result)   Collection Time: 07/16/17  5:12 PM  Result Value Ref Range Status   Specimen Description   Final    BLOOD RIGHT ANTECUBITAL Performed at [facility] [address] [place], [place] [address]    Special Requests   Final    BOTTLES DRAWN AEROBIC AND ANAEROBIC Blood Culture adequate volume Performed at [facility] [address], [place], [place] [address]    Culture   Final    NO GROWTH < 24 HOURS Performed at [facility] [address] [place], [place] [address]    Report Status PENDING  Incomplete  Culture, blood (routine x 2)     Status: None (Preliminary result)   Collection Time: 07/16/17  5:17 PM  Result Value Ref Range Status   Specimen Description   Final    BLOOD RIGHT ARM Performed at [facility], [place] [address], [place], [place] [address]  Special Requests   Final    BOTTLES DRAWN AEROBIC AND ANAEROBIC Blood Culture adequate volume Performed at [facility]  [address] [place], [place] [address]    Culture   Final    NO GROWTH < 24 HOURS Performed at [facility] [address] [place], [place] [address]    Report Status PENDING  Incomplete     Labs: CBC: Recent Labs  Lab 07/12/17 1108 07/14/17 2006 07/16/17 1030  WBC 14.6* 11.5* 9.4  NEUTROABS 12.2* 8.8* 6.0  HGB 11.6 11.1* 10.2*  HCT 36.2 34.4* 32.2*  MCV 78.8* 79.4 78.2  PLT 318 342 563   Basic Metabolic Panel: Recent Labs  Lab 07/11/17 1636 07/12/17 1108 07/14/17 2006 07/16/17 1030 07/17/17 0448  NA 135 139 139 134* 137  K 4.7 4.1 3.6 3.0* 3.5  CL 96* 99 98* 97* 101  CO2 28 27 27 24 24   GLUCOSE 188* 183* 138* 325* 245*  BUN 39* 41* 37* 41* 43*  CREATININE 1.10* 1.35* 0.98 1.11* 0.98  CALCIUM 9.0 9.4 8.9 8.5* 8.6*   Liver Function Tests: Recent Labs  Lab 07/12/17 1108 07/14/17 2006 07/16/17 1030 07/17/17 0448  AST 81* 43* 25 22  ALT 41 27 18 14   ALKPHOS 821* 563* 487* 441*  BILITOT 2.5* 4.6* 3.1* 2.4*  PROT 7.5 7.1 6.8 6.6  ALBUMIN 2.7* 2.6* 2.4* 2.3*   No results for input(s): LIPASE, AMYLASE in the last 168 hours. Recent Labs  Lab 07/17/17 0448  AMMONIA 37*   Cardiac Enzymes: No results for input(s): CKTOTAL, CKMB, CKMBINDEX, TROPONINI in the last 168 hours. BNP (last 3 results) Recent Labs    11/24/16 1253  BNP 58.1   CBG: Recent Labs  Lab 07/17/17 1225 07/17/17 1729 07/17/17 2005 07/18/17 0014 07/18/17 0408  GLUCAP 392* 482* 499* 207* 107*   Time spent: 35 minutes  Signed:  Berle Mull  [facility]  07/18/2017  , 10:50 AM

## 2017-07-18 NOTE — Progress Notes (Signed)
Medical Necessity form filled out and printed for nursing staff to call [facility] for transport home at discharge. Marney Doctor RN,BSN,NCM [phone]

## 2017-07-18 NOTE — Progress Notes (Signed)
Husband contacted and made aware that nurse will be calling [facility] and patient is being discharged. Family is waiting to receive the patient at home.

## 2017-07-18 NOTE — Consult Note (Signed)
[facility]:  Pt equipment is in home. Family is ready to receive pt if MD feels she is appropriate for discharge. Melissa Myers is aware to set up transport by ambulance if discharged today. RN Festus Holts also aware of the plan. If you have questions or concerns please call me-- [phone] Webb Silversmith RN

## 2017-07-19 ENCOUNTER — Telehealth: Payer: Self-pay | Admitting: *Deleted

## 2017-07-19 NOTE — Telephone Encounter (Signed)
Pt was on TCM report admitted 07/14/17 for Intractable pain. Patient was seen by physical therapy, who recommended SNF, but family decided w/home hospice which was arranged by social worker and case manager was D/C 07/18/17 to go home. Per summary to f/u w/PCP as needed.Marland KitchenJohny Myers

## 2017-07-20 ENCOUNTER — Ambulatory Visit: Payer: PPO | Admitting: Infectious Diseases

## 2017-07-21 LAB — CULTURE, BLOOD (ROUTINE X 2)
Culture: NO GROWTH
Culture: NO GROWTH
SPECIAL REQUESTS: ADEQUATE
Special Requests: ADEQUATE

## 2017-07-25 ENCOUNTER — Other Ambulatory Visit: Payer: Self-pay

## 2017-07-25 NOTE — Patient Outreach (Signed)
[facility]) Care Management  07/25/2017  Melissa Myers May 21, 1945 [id number]  Referral Date: 07/25/17 Referral Source: [facility] Referral Reason: Patient needing skilled visit.  Requesting [facility] Nurse Practitioner visit.   Outreach Attempt #1 Telephone call to patient. She is able to verify HIPAA.  Discussed reason for the call.  Patient confirms that she is with [facility].  She states that the nurse is visiting presently and allowed the nurse to speak with CM.  Spoke with the nurse from [facility] and she also confirms that patient has full hospice. Thanked them for the information.  No concerns voiced.     Plan: RN CM will close case.    Jone Baseman, RN, MSN [facility] Care Management Care Management Coordinator Direct Line [phone] Toll Free: [phone]  Fax: [phone]

## 2017-08-02 DIAGNOSIS — K529 Noninfective gastroenteritis and colitis, unspecified: Secondary | ICD-10-CM

## 2017-08-02 DIAGNOSIS — H9192 Unspecified hearing loss, left ear: Secondary | ICD-10-CM

## 2017-08-02 DIAGNOSIS — M4807 Spinal stenosis, lumbosacral region: Secondary | ICD-10-CM

## 2017-08-02 DIAGNOSIS — D649 Anemia, unspecified: Secondary | ICD-10-CM

## 2017-08-02 DIAGNOSIS — E785 Hyperlipidemia, unspecified: Secondary | ICD-10-CM

## 2017-08-02 DIAGNOSIS — E1140 Type 2 diabetes mellitus with diabetic neuropathy, unspecified: Secondary | ICD-10-CM | POA: Diagnosis not present

## 2017-08-02 DIAGNOSIS — I5030 Unspecified diastolic (congestive) heart failure: Secondary | ICD-10-CM | POA: Diagnosis not present

## 2017-08-02 DIAGNOSIS — M1990 Unspecified osteoarthritis, unspecified site: Secondary | ICD-10-CM

## 2017-08-02 DIAGNOSIS — G8929 Other chronic pain: Secondary | ICD-10-CM

## 2017-08-02 DIAGNOSIS — M4826 Kissing spine, lumbar region: Secondary | ICD-10-CM

## 2017-08-02 DIAGNOSIS — M47817 Spondylosis without myelopathy or radiculopathy, lumbosacral region: Secondary | ICD-10-CM

## 2017-08-02 DIAGNOSIS — K219 Gastro-esophageal reflux disease without esophagitis: Secondary | ICD-10-CM

## 2017-08-02 DIAGNOSIS — I872 Venous insufficiency (chronic) (peripheral): Secondary | ICD-10-CM | POA: Diagnosis not present

## 2017-08-02 DIAGNOSIS — I110 Hypertensive heart disease with heart failure: Secondary | ICD-10-CM | POA: Diagnosis not present

## 2017-08-02 DIAGNOSIS — C250 Malignant neoplasm of head of pancreas: Secondary | ICD-10-CM

## 2017-08-02 DIAGNOSIS — M48061 Spinal stenosis, lumbar region without neurogenic claudication: Secondary | ICD-10-CM

## 2017-08-02 DIAGNOSIS — Z7982 Long term (current) use of aspirin: Secondary | ICD-10-CM

## 2017-08-02 DIAGNOSIS — Z794 Long term (current) use of insulin: Secondary | ICD-10-CM

## 2017-08-02 DIAGNOSIS — M5136 Other intervertebral disc degeneration, lumbar region: Secondary | ICD-10-CM

## 2017-08-02 DIAGNOSIS — M47816 Spondylosis without myelopathy or radiculopathy, lumbar region: Secondary | ICD-10-CM

## 2017-08-23 ENCOUNTER — Other Ambulatory Visit (HOSPITAL_COMMUNITY): Payer: Self-pay | Admitting: Cardiology

## 2017-08-29 ENCOUNTER — Ambulatory Visit: Payer: PPO | Admitting: Internal Medicine

## 2017-10-20 ENCOUNTER — Encounter (HOSPITAL_COMMUNITY): Payer: PPO | Admitting: Cardiology

## 2017-11-10 ENCOUNTER — Inpatient Hospital Stay (HOSPITAL_COMMUNITY): Admitting: Certified Registered Nurse Anesthetist

## 2017-11-10 ENCOUNTER — Observation Stay (HOSPITAL_COMMUNITY)
Admission: EM | Admit: 2017-11-10 | Discharge: 2017-11-10 | Disposition: A | Discharge status: Hospice | Attending: Internal Medicine | Admitting: Internal Medicine

## 2017-11-10 ENCOUNTER — Emergency Department (HOSPITAL_COMMUNITY)

## 2017-11-10 ENCOUNTER — Encounter (HOSPITAL_COMMUNITY): Payer: Self-pay | Admitting: Emergency Medicine

## 2017-11-10 ENCOUNTER — Encounter (HOSPITAL_COMMUNITY)
Admission: EM | Disposition: A | Discharge status: Hospice | Payer: Self-pay | Source: Home / Self Care | Attending: Internal Medicine

## 2017-11-10 ENCOUNTER — Other Ambulatory Visit: Payer: Self-pay

## 2017-11-10 DIAGNOSIS — I10 Essential (primary) hypertension: Secondary | ICD-10-CM | POA: Diagnosis present

## 2017-11-10 DIAGNOSIS — R52 Pain, unspecified: Secondary | ICD-10-CM

## 2017-11-10 DIAGNOSIS — R0902 Hypoxemia: Secondary | ICD-10-CM | POA: Diagnosis not present

## 2017-11-10 DIAGNOSIS — G4733 Obstructive sleep apnea (adult) (pediatric): Secondary | ICD-10-CM | POA: Insufficient documentation

## 2017-11-10 DIAGNOSIS — Z7982 Long term (current) use of aspirin: Secondary | ICD-10-CM | POA: Diagnosis not present

## 2017-11-10 DIAGNOSIS — Z79899 Other long term (current) drug therapy: Secondary | ICD-10-CM | POA: Insufficient documentation

## 2017-11-10 DIAGNOSIS — E119 Type 2 diabetes mellitus without complications: Secondary | ICD-10-CM

## 2017-11-10 DIAGNOSIS — Z01818 Encounter for other preprocedural examination: Secondary | ICD-10-CM | POA: Diagnosis not present

## 2017-11-10 DIAGNOSIS — W010XXA Fall on same level from slipping, tripping and stumbling without subsequent striking against object, initial encounter: Secondary | ICD-10-CM | POA: Insufficient documentation

## 2017-11-10 DIAGNOSIS — H9192 Unspecified hearing loss, left ear: Secondary | ICD-10-CM | POA: Insufficient documentation

## 2017-11-10 DIAGNOSIS — E1142 Type 2 diabetes mellitus with diabetic polyneuropathy: Secondary | ICD-10-CM | POA: Insufficient documentation

## 2017-11-10 DIAGNOSIS — Z66 Do not resuscitate: Secondary | ICD-10-CM | POA: Insufficient documentation

## 2017-11-10 DIAGNOSIS — R000 Tachycardia, unspecified: Secondary | ICD-10-CM | POA: Diagnosis not present

## 2017-11-10 DIAGNOSIS — R001 Bradycardia, unspecified: Secondary | ICD-10-CM | POA: Diagnosis not present

## 2017-11-10 DIAGNOSIS — Z794 Long term (current) use of insulin: Secondary | ICD-10-CM | POA: Diagnosis not present

## 2017-11-10 DIAGNOSIS — S72142A Displaced intertrochanteric fracture of left femur, initial encounter for closed fracture: Principal | ICD-10-CM | POA: Insufficient documentation

## 2017-11-10 DIAGNOSIS — G2581 Restless legs syndrome: Secondary | ICD-10-CM | POA: Insufficient documentation

## 2017-11-10 DIAGNOSIS — C250 Malignant neoplasm of head of pancreas: Secondary | ICD-10-CM | POA: Insufficient documentation

## 2017-11-10 DIAGNOSIS — C259 Malignant neoplasm of pancreas, unspecified: Secondary | ICD-10-CM

## 2017-11-10 DIAGNOSIS — I5032 Chronic diastolic (congestive) heart failure: Secondary | ICD-10-CM | POA: Diagnosis not present

## 2017-11-10 DIAGNOSIS — F329 Major depressive disorder, single episode, unspecified: Secondary | ICD-10-CM | POA: Insufficient documentation

## 2017-11-10 DIAGNOSIS — E1165 Type 2 diabetes mellitus with hyperglycemia: Secondary | ICD-10-CM | POA: Diagnosis not present

## 2017-11-10 DIAGNOSIS — Z79891 Long term (current) use of opiate analgesic: Secondary | ICD-10-CM | POA: Diagnosis not present

## 2017-11-10 DIAGNOSIS — Z515 Encounter for palliative care: Secondary | ICD-10-CM | POA: Diagnosis not present

## 2017-11-10 DIAGNOSIS — I110 Hypertensive heart disease with heart failure: Secondary | ICD-10-CM | POA: Diagnosis not present

## 2017-11-10 DIAGNOSIS — G894 Chronic pain syndrome: Secondary | ICD-10-CM | POA: Insufficient documentation

## 2017-11-10 DIAGNOSIS — D630 Anemia in neoplastic disease: Secondary | ICD-10-CM | POA: Insufficient documentation

## 2017-11-10 DIAGNOSIS — Z9981 Dependence on supplemental oxygen: Secondary | ICD-10-CM | POA: Insufficient documentation

## 2017-11-10 DIAGNOSIS — IMO0001 Reserved for inherently not codable concepts without codable children: Secondary | ICD-10-CM

## 2017-11-10 DIAGNOSIS — Z6835 Body mass index (BMI) 35.0-35.9, adult: Secondary | ICD-10-CM | POA: Diagnosis not present

## 2017-11-10 DIAGNOSIS — S72145A Nondisplaced intertrochanteric fracture of left femur, initial encounter for closed fracture: Secondary | ICD-10-CM | POA: Diagnosis not present

## 2017-11-10 DIAGNOSIS — S72002A Fracture of unspecified part of neck of left femur, initial encounter for closed fracture: Secondary | ICD-10-CM | POA: Diagnosis not present

## 2017-11-10 DIAGNOSIS — R404 Transient alteration of awareness: Secondary | ICD-10-CM | POA: Diagnosis not present

## 2017-11-10 DIAGNOSIS — R011 Cardiac murmur, unspecified: Secondary | ICD-10-CM | POA: Insufficient documentation

## 2017-11-10 DIAGNOSIS — Y92009 Unspecified place in unspecified non-institutional (private) residence as the place of occurrence of the external cause: Secondary | ICD-10-CM | POA: Insufficient documentation

## 2017-11-10 LAB — URINALYSIS, ROUTINE W REFLEX MICROSCOPIC
Bilirubin Urine: NEGATIVE
Glucose, UA: NEGATIVE mg/dL
Hgb urine dipstick: NEGATIVE
Ketones, ur: NEGATIVE mg/dL
Nitrite: NEGATIVE
Protein, ur: NEGATIVE mg/dL
Specific Gravity, Urine: 1.008 (ref 1.005–1.030)
pH: 6 (ref 5.0–8.0)

## 2017-11-10 LAB — CBC WITH DIFFERENTIAL/PLATELET
BASOS ABS: 0.1 10*3/uL (ref 0.0–0.1)
Basophils Relative: 1 %
Eosinophils Absolute: 0.1 10*3/uL (ref 0.0–0.7)
Eosinophils Relative: 1 %
HEMATOCRIT: 33.9 % — AB (ref 36.0–46.0)
Hemoglobin: 11.3 g/dL — ABNORMAL LOW (ref 12.0–15.0)
LYMPHS ABS: 2 10*3/uL (ref 0.7–4.0)
LYMPHS PCT: 20 %
MCH: 26.9 pg (ref 26.0–34.0)
MCHC: 33.3 g/dL (ref 30.0–36.0)
MCV: 80.7 fL (ref 78.0–100.0)
MONOS PCT: 7 %
Monocytes Absolute: 0.7 10*3/uL (ref 0.1–1.0)
NEUTROS PCT: 71 %
Neutro Abs: 6.9 10*3/uL (ref 1.7–7.7)
Platelets: 224 10*3/uL (ref 150–400)
RBC: 4.2 MIL/uL (ref 3.87–5.11)
RDW: 22.1 % — AB (ref 11.5–15.5)
WBC: 9.8 10*3/uL (ref 4.0–10.5)

## 2017-11-10 LAB — PROTIME-INR
INR: 1.31
Prothrombin Time: 16.2 seconds — ABNORMAL HIGH (ref 11.4–15.2)

## 2017-11-10 LAB — COMPREHENSIVE METABOLIC PANEL
ALT: 40 U/L (ref 0–44)
AST: 82 U/L — AB (ref 15–41)
Albumin: 2.1 g/dL — ABNORMAL LOW (ref 3.5–5.0)
Alkaline Phosphatase: 421 U/L — ABNORMAL HIGH (ref 38–126)
Anion gap: 12 (ref 5–15)
BUN: 47 mg/dL — AB (ref 8–23)
CHLORIDE: 92 mmol/L — AB (ref 98–111)
CO2: 29 mmol/L (ref 22–32)
CREATININE: 1.51 mg/dL — AB (ref 0.44–1.00)
Calcium: 8.5 mg/dL — ABNORMAL LOW (ref 8.9–10.3)
GFR calc Af Amer: 39 mL/min — ABNORMAL LOW (ref >60)
GFR, EST NON AFRICAN AMERICAN: 33 mL/min — AB (ref >60)
Glucose, Bld: 257 mg/dL — ABNORMAL HIGH (ref 70–99)
Potassium: 4.4 mmol/L (ref 3.5–5.1)
SODIUM: 133 mmol/L — AB (ref 135–145)
Total Bilirubin: 15.4 mg/dL — ABNORMAL HIGH (ref 0.3–1.2)
Total Protein: 6.3 g/dL — ABNORMAL LOW (ref 6.5–8.1)

## 2017-11-10 LAB — I-STAT CHEM 8, ED
BUN: 49 mg/dL — ABNORMAL HIGH (ref 8–23)
CREATININE: 1.6 mg/dL — AB (ref 0.44–1.00)
Calcium, Ion: 1.03 mmol/L — ABNORMAL LOW (ref 1.15–1.40)
Chloride: 94 mmol/L — ABNORMAL LOW (ref 98–111)
Glucose, Bld: 259 mg/dL — ABNORMAL HIGH (ref 70–99)
HEMATOCRIT: 37 % (ref 36.0–46.0)
HEMOGLOBIN: 12.6 g/dL (ref 12.0–15.0)
POTASSIUM: 4.4 mmol/L (ref 3.5–5.1)
Sodium: 134 mmol/L — ABNORMAL LOW (ref 135–145)
TCO2: 32 mmol/L (ref 22–32)

## 2017-11-10 LAB — GLUCOSE, CAPILLARY
GLUCOSE-CAPILLARY: 218 mg/dL — AB (ref 70–99)
Glucose-Capillary: 274 mg/dL — ABNORMAL HIGH (ref 70–99)

## 2017-11-10 LAB — LIPASE, BLOOD: Lipase: 16 U/L (ref 11–51)

## 2017-11-10 LAB — I-STAT TROPONIN, ED: Troponin i, poc: 0 ng/mL (ref 0.00–0.08)

## 2017-11-10 LAB — SURGICAL PCR SCREEN
MRSA, PCR: NEGATIVE
Staphylococcus aureus: NEGATIVE

## 2017-11-10 LAB — MAGNESIUM: Magnesium: 1.9 mg/dL (ref 1.7–2.4)

## 2017-11-10 SURGERY — INSERTION, INTRAMEDULLARY ROD, FEMUR
Anesthesia: General

## 2017-11-10 MED ORDER — POLYETHYLENE GLYCOL 3350 17 G PO PACK: 17.0000 g | PACK | Freq: Every day | ORAL | 0 refills | Status: AC

## 2017-11-10 MED ORDER — MIDAZOLAM HCL 2 MG/2ML IJ SOLN
INTRAMUSCULAR | Status: AC
  Filled 2017-11-10: qty 2

## 2017-11-10 MED ORDER — HYDROMORPHONE HCL 1 MG/ML IJ SOLN
0.5000 mg | INTRAMUSCULAR | Status: DC | PRN
  Administered 2017-11-10 (×2): 0.5 mg via INTRAVENOUS
  Filled 2017-11-10: qty 1

## 2017-11-10 MED ORDER — MORPHINE SULFATE (PF) 2 MG/ML IV SOLN: 2.0000 mg | INTRAVENOUS | Status: DC | PRN

## 2017-11-10 MED ORDER — CEFAZOLIN SODIUM-DEXTROSE 2-4 GM/100ML-% IV SOLN: 2.0000 g | INTRAVENOUS | Status: DC

## 2017-11-10 MED ORDER — CEFAZOLIN SODIUM-DEXTROSE 2-4 GM/100ML-% IV SOLN
INTRAVENOUS | Status: AC
  Filled 2017-11-10: qty 100

## 2017-11-10 MED ORDER — PROPOFOL 10 MG/ML IV BOLUS
INTRAVENOUS | Status: AC
  Filled 2017-11-10: qty 40

## 2017-11-10 MED ORDER — PREGABALIN 75 MG PO CAPS: 75.0000 mg | ORAL_CAPSULE | Freq: Two times a day (BID) | ORAL | Status: DC

## 2017-11-10 MED ORDER — DOCUSATE SODIUM 100 MG PO CAPS: 100.0000 mg | ORAL_CAPSULE | Freq: Two times a day (BID) | ORAL | Status: DC

## 2017-11-10 MED ORDER — ROPINIROLE HCL 1 MG PO TABS: 2.0000 mg | ORAL_TABLET | Freq: Two times a day (BID) | ORAL | Status: DC

## 2017-11-10 MED ORDER — CHLORHEXIDINE GLUCONATE 4 % EX LIQD
60.0000 mL | Freq: Once | CUTANEOUS | Status: AC
  Administered 2017-11-10: 4 via TOPICAL
  Filled 2017-11-10: qty 60

## 2017-11-10 MED ORDER — SERTRALINE HCL 50 MG PO TABS
50.0000 mg | ORAL_TABLET | Freq: Every day | ORAL | Status: DC
  Administered 2017-11-10: 50 mg via ORAL
  Filled 2017-11-10: qty 1

## 2017-11-10 MED ORDER — POLYETHYLENE GLYCOL 3350 17 G PO PACK: 17.0000 g | PACK | Freq: Every day | ORAL | Status: DC

## 2017-11-10 MED ORDER — HYDROMORPHONE HCL 1 MG/ML IJ SOLN
INTRAMUSCULAR | Status: AC
  Filled 2017-11-10: qty 1

## 2017-11-10 MED ORDER — INSULIN ASPART 100 UNIT/ML ~~LOC~~ SOLN
0.0000 [IU] | Freq: Three times a day (TID) | SUBCUTANEOUS | Status: DC
  Administered 2017-11-10: 8 [IU] via SUBCUTANEOUS
  Administered 2017-11-10: 5 [IU] via SUBCUTANEOUS

## 2017-11-10 MED ORDER — MIRTAZAPINE 15 MG PO TABS: 15.0000 mg | ORAL_TABLET | Freq: Every day | ORAL | Status: DC

## 2017-11-10 MED ORDER — POVIDONE-IODINE 10 % EX SWAB: 2.0000 "application " | Freq: Once | CUTANEOUS | Status: DC

## 2017-11-10 MED ORDER — VITAMIN D3 25 MCG (1000 UNIT) PO TABS: 2000.0000 [IU] | ORAL_TABLET | Freq: Every day | ORAL | Status: DC

## 2017-11-10 MED ORDER — LIDOCAINE 5 % EX PTCH
1.0000 | MEDICATED_PATCH | CUTANEOUS | Status: DC
  Administered 2017-11-10: 1 via TRANSDERMAL
  Filled 2017-11-10: qty 1

## 2017-11-10 MED ORDER — HYDROCODONE-ACETAMINOPHEN 5-325 MG PO TABS
1.0000 | ORAL_TABLET | Freq: Four times a day (QID) | ORAL | Status: DC | PRN
  Administered 2017-11-10: 1 via ORAL
  Filled 2017-11-10: qty 1

## 2017-11-10 MED ORDER — HYDROMORPHONE HCL 1 MG/ML IJ SOLN: 0.5000 mg | INTRAMUSCULAR | 0 refills | Status: AC | PRN

## 2017-11-10 MED ORDER — BACLOFEN 10 MG PO TABS
10.0000 mg | ORAL_TABLET | Freq: Three times a day (TID) | ORAL | Status: DC
  Administered 2017-11-10: 10 mg via ORAL
  Filled 2017-11-10: qty 1

## 2017-11-10 MED ORDER — INSULIN GLARGINE 100 UNIT/ML ~~LOC~~ SOLN
15.0000 [IU] | Freq: Every day | SUBCUTANEOUS | Status: DC
  Administered 2017-11-10: 15 [IU] via SUBCUTANEOUS
  Filled 2017-11-10: qty 0.15

## 2017-11-10 MED ORDER — FENTANYL CITRATE (PF) 250 MCG/5ML IJ SOLN
INTRAMUSCULAR | Status: AC
  Filled 2017-11-10: qty 5

## 2017-11-10 MED ORDER — HYDROMORPHONE HCL 1 MG/ML IJ SOLN
1.0000 mg | INTRAMUSCULAR | Status: DC | PRN
  Filled 2017-11-10: qty 1

## 2017-11-10 MED ORDER — FENTANYL CITRATE (PF) 100 MCG/2ML IJ SOLN
50.0000 ug | Freq: Once | INTRAMUSCULAR | Status: AC
  Administered 2017-11-10: 50 ug via INTRAVENOUS
  Filled 2017-11-10: qty 2

## 2017-11-10 MED ORDER — LACTATED RINGERS IV SOLN
INTRAVENOUS | Status: DC
  Administered 2017-11-10: 12:00:00 via INTRAVENOUS

## 2017-11-10 NOTE — ED Provider Notes (Signed)
[facility] EMERGENCY DEPARTMENT Provider Note   CSN: [id number] Arrival date & time: 11/10/17  0431     History   Chief Complaint Chief Complaint  Patient presents with  . Fall    HPI Melissa Myers is a 72 y.o. female.  The history is provided by the EMS personnel. The history is limited by the condition of the patient.  Fall  This is a new problem. The current episode started less than 1 hour ago. The problem occurs constantly. The problem has not changed since onset.Pertinent negatives include no chest pain, no headaches and no shortness of breath. Nothing aggravates the symptoms. Nothing relieves the symptoms. Treatments tried: fentanyl 200 mcg. The treatment provided no relief.    Past Medical History:  Diagnosis Date  . Anemia   . Cancer [facility])    Pancreatic  . Cellulitis    LOWER EXTREMITIES  . Chronic diarrhea    a/w nausea - felt related to IBS  . Deaf    left side only  . Diastolic CHF ([place])   . Disc degeneration, lumbar   . Family history of breast cancer   . Family history of ovarian cancer   . Family history of stomach cancer   . GERD (gastroesophageal reflux disease)   . Hyperlipidemia    hx rhabdo on statins  . Hypertension   . Neuropathy    feet, toes and fingers  . On home oxygen therapy    uses oxygen 2 liters min per [place] at night and prn during day  . OSA (obstructive sleep apnea)    05/2009 sleep study - refuses CPAP  . Osteoarthritis   . RLS (restless legs syndrome)   . Shortness of breath    chronic  . Stasis dermatitis   . Type II or unspecified type diabetes mellitus without mention of complication, not stated as uncontrolled    insulin dep    Patient Active Problem List   Diagnosis Date Noted  . Acute encephalopathy 07/16/2017  . Intractable pain 07/14/2017  . Back pain 06/19/2017  . Intractable back pain 06/17/2017  . Genetic testing 06/16/2017  . Venous insufficiency 03/10/2017  . Port-A-Cath in place  02/01/2017  . Diarrhea 01/10/2017  . Goals of care, counseling/discussion   . Malignant neoplasm of head of pancreas ([place])   . Cholangitis 05/05/2016  . Common bile duct stricture   . Hyperbilirubinemia 03/15/2016  . Diabetic peripheral neuropathy ([place]) 02/23/2016  . Murmur, cardiac 07/02/2015  . Chronic pain syndrome 05/05/2015  . MDD (major depressive disorder), single episode, severe ([place]) 05/05/2015  . Chronic anemia 04/14/2015  . Cellulitis 04/14/2015  . IDDM (insulin dependent diabetes mellitus) ([place]) 03/19/2015  . Hyperlipidemia 12/23/2014  . Chronic diastolic heart failure ([place]) 06/01/2009  . Vitamin D deficiency 05/12/2009  . Sleep apnea 04/29/2009  . Dyslipidemia 04/20/2009  . Morbid obesity ([place]) 04/20/2009  . RESTLESS LEGS SYNDROME 04/20/2009  . Essential hypertension 04/20/2009    Past Surgical History:  Procedure Laterality Date  . BILIARY STENT PLACEMENT N/A 05/06/2016   Procedure: BILIARY STENT PLACEMENT;  Surgeon: Gatha Mayer, MD;  Location: [facility];  Service: Endoscopy;  Laterality: N/A;  . CHOLECYSTECTOMY  1997  . COLONOSCOPY N/A 12/03/2012   Procedure: COLONOSCOPY;  Surgeon: Lafayette Dragon, MD;  Location: [facility];  Service: Endoscopy;  Laterality: N/A;  . ENDOSCOPIC RETROGRADE CHOLANGIOPANCREATOGRAPHY (ERCP) WITH PROPOFOL N/A 05/06/2016   Procedure: ENDOSCOPIC RETROGRADE CHOLANGIOPANCREATOGRAPHY (ERCP) WITH PROPOFOL;  Surgeon: Gatha Mayer, MD;  Location: [facility];  Service: Endoscopy;  Laterality: N/A;  . ERCP N/A 03/18/2016   Procedure: ENDOSCOPIC RETROGRADE CHOLANGIOPANCREATOGRAPHY (ERCP);  Surgeon: Irene Shipper, MD;  Location: [facility];  Service: Endoscopy;  Laterality: N/A;  . EUS N/A 04/28/2016   Procedure: UPPER ENDOSCOPIC ULTRASOUND (EUS) LINEAR;  Surgeon: [name], MD;  Location: [facility];  Service: Endoscopy;  Laterality: N/A;  . IR CHOLANGIOGRAM EXISTING TUBE  06/09/2016  . IR GENERIC HISTORICAL  05/07/2016   IR BILIARY  DRAIN PLACEMENT WITH CHOLANGIOGRAM 05/07/2016 Jacqulynn Cadet, MD [facility]-INTER[facility] RAD  . IR GENERIC HISTORICAL  05/19/2016   IR BILIARY STENT(S) EXISTING ACCESS INC DILATION CATH EXCHANGE 05/19/2016 Jacqulynn Cadet, MD [facility]-INTER[facility] RAD  . TONSILLECTOMY  1970  . TUBAL LIGATION  1980  . UMBILICAL HERNIA REPAIR  1995  . uterine polyp removal  2008     OB History   None      Home Medications    Prior to Admission medications   Medication Sig Start Date End Date Taking? Authorizing Provider  aspirin EC 81 MG tablet Take 1 tablet (81 mg total) by mouth daily. 07/08/14   Rowe Clack, MD  baclofen (LIORESAL) 10 MG tablet Take 0.5 tablets (5 mg total) by mouth 3 (three) times daily. Patient taking differently: Take 10 mg by mouth 3 (three) times daily.  05/02/17   Tanner, Lyndon Code., PA-C  carvedilol (COREG) 25 MG tablet TAKE ONE TABLET TWICE DAILY WITH FOOD 01/25/17   Larey Dresser, MD  Cholecalciferol (VITAMIN D3) 2000 units capsule Take 1 capsule (2,000 Units total) by mouth daily. 06/17/15   Janith Lima, MD  Continuous Blood Gluc Sensor ([place]) MISC Use to check sugars 12/30/16   [name], MD  docusate sodium (COLACE) 100 MG capsule Take 1 capsule (100 mg total) by mouth 2 (two) times daily. 06/20/17 06/20/18  Lavina Hamman, MD  HYDROcodone-acetaminophen (NORCO/VICODIN) 5-325 MG tablet Take 1 tablet by mouth every 6 (six) hours as needed for moderate pain or severe pain. 06/20/17   Lavina Hamman, MD  insulin aspart (NOVOLOG) 100 UNIT/ML injection Inject 0-20 Units into the skin 3 (three) times daily with meals. . CBG 70 - 120: 0 units CBG 121 - 150: 3 units CBG 151 - 200: 4 units CBG 201 - 250: 7 units CBG 251 - 300: 11 units CBG 301 - 350: 15 units CBG 351 - 400: 20 units 03/21/16   Mariel Aloe, MD  insulin glargine (LANTUS) 100 UNIT/ML injection Inject 0.3 mLs (30 Units total) into the skin daily. 03/22/16   Mariel Aloe, MD  lidocaine  (LIDODERM) 5 % Place 1 patch daily onto the skin. Remove & Discard patch within 12 hours or as directed by MD 12/30/16   [name], MD  lidocaine-prilocaine (EMLA) cream Apply to port site prior to being accessed 09/20/16   [provider]  lipase/protease/amylase (CREON) 36000 UNITS CPEP capsule 2 po prior to snacks; 4 po prior to meals 09/20/16   [provider]  metFORMIN (GLUCOPHAGE) 500 MG tablet Take 500 mg by mouth daily with breakfast.    [provider]  mirtazapine (REMERON) 15 MG tablet Take 1 tablet (15 mg total) by mouth at bedtime. 06/20/17   Lavina Hamman, MD  mupirocin ointment (BACTROBAN) 2 % Apply 1 application topically daily. 03/10/17   [name], MD  ondansetron (ZOFRAN) 8 MG tablet Take 1 tablet (8 mg total) by mouth  every 8 (eight) hours as needed for nausea or vomiting. 01/10/17   Truitt Merle, MD  [facility] VERIO test strip USE AS INSTRUCTED UP TO 4 TIMES DAILY 11/03/16   [name], MD  oxyCODONE (OXY IR/ROXICODONE) 5 MG immediate release tablet Take 1 tablet (5 mg total) by mouth every 6 (six) hours as needed for severe pain. Patient not taking: Reported on 07/14/2017 07/12/17   Truitt Merle, MD  polyethylene glycol [facility]) packet Take 17 g by mouth daily. 06/21/17   Lavina Hamman, MD  potassium chloride SA (K-DUR,KLOR-CON) 20 MEQ tablet Take 20 mEq by mouth daily.    [provider]  potassium chloride SA (K-DUR,KLOR-CON) 20 MEQ tablet TAKE TWO TABLETS TWICE DAILY 08/23/17   Larey Dresser, MD  pregabalin (LYRICA) 75 MG capsule Take 1 capsule (75 mg total) by mouth 2 (two) times daily. 06/29/17   [name], MD  rOPINIRole (REQUIP) 2 MG tablet Take 1 tablet (2 mg total) by mouth 2 (two) times daily. 06/20/17   Lavina Hamman, MD  sertraline (ZOLOFT) 50 MG tablet Take 50 mg by mouth daily.    [provider]  silver sulfADIAZINE (SILVADENE) 1 % cream Apply 1 application daily topically.  01/03/17   [name], MD  spironolactone (ALDACTONE) 25 MG tablet Take 1 tablet (25 mg total) by mouth daily. 06/17/16 11/24/17  Larey Dresser, MD  torsemide (DEMADEX) 100 MG tablet Take 50 mg by mouth daily.    [provider]  traMADol (ULTRAM) 50 MG tablet Take 1 tablet (50 mg total) by mouth every 6 (six) hours as needed. Patient taking differently: Take 50 mg every 6 (six) hours as needed by mouth for moderate pain.  11/03/16   [name], MD    Family History Family History  Problem Relation Age of Onset  . Heart disease Mother   . Heart attack Mother 22  . Heart disease Father   . Heart attack Father 34  . Heart disease Unknown        family history  . Stomach cancer Paternal Grandmother   . Lung cancer Paternal Grandfather   . CVA Unknown        several aunts  . Heart attack Unknown        several aunts and an uncle  . Colon cancer Neg Hx     Social History Social History   Tobacco Use  . Smoking status: Never Smoker  . Smokeless tobacco: Never Used  Substance Use Topics  . Alcohol use: No  . Drug use: No     Allergies   Morphine; Statins; Sulfa antibiotics; Cymbalta [duloxetine hcl]; Sulfasalazine; Levemir [insulin detemir]; and Zinc   Review of Systems Review of Systems  Unable to perform ROS: Acuity of condition  Constitutional: Negative for fever.  Respiratory: Negative for shortness of breath.   Cardiovascular: Negative for chest pain.  Musculoskeletal: Positive for arthralgias.  Skin: Positive for color change.  Neurological: Negative for headaches.     Physical Exam Updated Vital Signs BP 119/63   Pulse (!) 41   Resp 18   Ht 5\' 1"  (1.549 m)   Wt 86.2 kg   SpO2 96%   BMI 35.90 kg/m   Physical Exam  Constitutional: She appears well-developed and well-nourished.  HENT:  Head: Normocephalic and atraumatic.  Mouth/Throat: No oropharyngeal exudate.  Eyes: Pupils are equal, round, and reactive to light.  Scleral icterus is present.  Neck: Normal range  of motion. Neck supple.  Cardiovascular: Normal rate, regular rhythm, normal heart sounds and intact distal pulses.  Pulmonary/Chest: Effort normal and breath sounds normal. No stridor. She has no wheezes. She has no rales.  Abdominal: Soft. Bowel sounds are normal. She exhibits no mass. There is no tenderness. There is no rebound and no guarding.  Musculoskeletal: She exhibits deformity.  Left hip foreshortened and externally rotated.    Neurological: She is alert. She displays normal reflexes.  Skin: Skin is warm and dry. Capillary refill takes less than 2 seconds. She is not diaphoretic.  Jaundice   Psychiatric: She has a normal mood and affect.     ED Treatments / Results  Labs (all labs ordered are listed, but only abnormal results are displayed) Results for orders placed or performed during the hospital encounter of 11/10/17  CBC with Differential/Platelet  Result Value Ref Range   WBC 9.8 4.0 - 10.5 K/uL   RBC 4.20 3.87 - 5.11 MIL/uL   Hemoglobin 11.3 (L) 12.0 - 15.0 g/dL   HCT 33.9 (L) 36.0 - 46.0 %   MCV 80.7 78.0 - 100.0 fL   MCH 26.9 26.0 - 34.0 pg   MCHC 33.3 30.0 - 36.0 g/dL   RDW 22.1 (H) 11.5 - 15.5 %   Platelets 224 150 - 400 K/uL   Neutrophils Relative % 71 %   Lymphocytes Relative 20 %   Monocytes Relative 7 %   Eosinophils Relative 1 %   Basophils Relative 1 %   Neutro Abs 6.9 1.7 - 7.7 K/uL   Lymphs Abs 2.0 0.7 - 4.0 K/uL   Monocytes Absolute 0.7 0.1 - 1.0 K/uL   Eosinophils Absolute 0.1 0.0 - 0.7 K/uL   Basophils Absolute 0.1 0.0 - 0.1 K/uL   RBC Morphology TARGET CELLS   Comprehensive metabolic panel  Result Value Ref Range   Sodium 133 (L) 135 - 145 mmol/L   Potassium 4.4 3.5 - 5.1 mmol/L   Chloride 92 (L) 98 - 111 mmol/L   CO2 29 22 - 32 mmol/L   Glucose, Bld 257 (H) 70 - 99 mg/dL   BUN 47 (H) 8 - 23 mg/dL   Creatinine, Ser 1.51 (H) 0.44 - 1.00 mg/dL   Calcium 8.5 (L) 8.9 - 10.3 mg/dL   Total  Protein 6.3 (L) 6.5 - 8.1 g/dL   Albumin 2.1 (L) 3.5 - 5.0 g/dL   AST 82 (H) 15 - 41 U/L   ALT 40 0 - 44 U/L   Alkaline Phosphatase 421 (H) 38 - 126 U/L   Total Bilirubin 15.4 (H) 0.3 - 1.2 mg/dL   GFR calc non Af Amer 33 (L) >60 mL/min   GFR calc Af Amer 39 (L) >60 mL/min   Anion gap 12 5 - 15  Lipase, blood  Result Value Ref Range   Lipase 16 11 - 51 U/L  Protime-INR  Result Value Ref Range   Prothrombin Time 16.2 (H) 11.4 - 15.2 seconds   INR 1.31   Magnesium  Result Value Ref Range   Magnesium 1.9 1.7 - 2.4 mg/dL  I-stat chem 8, ed  Result Value Ref Range   Sodium 134 (L) 135 - 145 mmol/L   Potassium 4.4 3.5 - 5.1 mmol/L   Chloride 94 (L) 98 - 111 mmol/L   BUN 49 (H) 8 - 23 mg/dL   Creatinine, Ser 1.60 (H) 0.44 - 1.00 mg/dL   Glucose, Bld 259 (H) 70 - 99 mg/dL   Calcium, Ion 1.03 (  L) 1.15 - 1.40 mmol/L   TCO2 32 22 - 32 mmol/L   Hemoglobin 12.6 12.0 - 15.0 g/dL   HCT 37.0 36.0 - 46.0 %  I-stat troponin, ED  Result Value Ref Range   Troponin i, poc 0.00 0.00 - 0.08 ng/mL   Comment 3           *Note: Due to a large number of results and/or encounters for the requested time period, some results have not been displayed. A complete set of results can be found in Results Review.   No results found.  EKG None  Radiology No results found.  Procedures Procedures (including critical care time)  Medications Ordered in ED Medications  fentaNYL (SUBLIMAZE) injection 50 mcg (has no administration in time range)      Final Clinical Impressions(s) / ED Diagnoses   Final diagnoses:  Closed fracture of left hip, initial encounter [facility])  Malignant neoplasm of pancreas, unspecified location of malignancy ([place])  Hyperbilirubinemia    Admit to medicine, case d/w Dr. Griffin Basil of orthopedics   Caylah Plouff, MD 11/10/17 [phone]

## 2017-11-10 NOTE — Progress Notes (Signed)
Orthopedic Tech Progress Note Patient Details:  Melissa Myers 1945-07-30 [id number]  Ortho Devices Type of Ortho Device: Knee Immobilizer Ortho Device/Splint Location: lle Ortho Device/Splint Interventions: Application   Post Interventions Patient Tolerated: Well Instructions Provided: Care of device   Hildred Priest 11/10/2017, 5:12 PM

## 2017-11-10 NOTE — ED Notes (Signed)
Pt returned from xray

## 2017-11-10 NOTE — Progress Notes (Signed)
[place]:  MD called and requested pt be evaluated for Hospice Residential care because it was decided to not do surgery by family based on her comorbidity and high risk.   Pt is hurting with movement. She has some forgetfulness noted. There are two daughter and her spouse at bedside. We discussed hospice home and the pt's goals of care. They are in agreemnt that pt would like to go there for sx management and possible end of life care.   Our medical Director does approve pt to come to the hospice home. Pt will trasnfer today . [facility] ambulance called MD notified that pt can go today and he is in agreement as well.   [facility] called for pick up at [place] [phone]

## 2017-11-10 NOTE — Anesthesia Preprocedure Evaluation (Signed)
Anesthesia Evaluation  Patient identified by MRN, date of birth, ID band Patient awake    Reviewed: Allergy & Precautions, NPO status , Patient's Chart, lab work & pertinent test results, reviewed documented beta blocker date and time   Airway        Dental   Pulmonary sleep apnea and Oxygen sleep apnea ,           Cardiovascular hypertension, Pt. on home beta blockers and Pt. on medications +CHF    Echo 06/17/17: Study Conclusions  - Left ventricle: The cavity size was normal. There was moderate concentric hypertrophy. Systolic function was vigorous. The estimated ejection fraction was in the range of 65% to 70%. Wall motion was normal; there were no regional wall motion abnormalities. Features are consistent with a pseudonormal left ventricular filling pattern, with concomitant abnormal relaxation and increased filling pressure (grade 2 diastolic dysfunction).   Doppler parameters are consistent with high ventricular filling pressure. - Aortic valve: Valve area (VTI): 1.91 cm^2. Valve area (Vmax): 1.98 cm^2. Valve area (Vmean): 1.81 cm^2. - Mitral valve: Calcified annulus. The findings are consistent with mild stenosis. Mean gradient (D): 6 mm Hg. Valve area by pressure half-time: 2.02 cm^2. Valve area by continuity equation (using LVOT flow): 1.43 cm^2. - Left atrium: The atrium was mildly dilated. - Right ventricle: The cavity size was moderately dilated. Wall thickness was normal. - Atrial septum: A patent foramen ovale cannot be excluded. - Pulmonic valve: There was trivial regurgitation. - Pulmonary arteries: PA peak pressure: 35 mm Hg (S). - Recommendations: Agotated saline contrast study to rule out PFO as colorflow doppler is suspicious for presence of PFO   Neuro/Psych L sided deafness     GI/Hepatic GERD  ,Pancreatic cancer    Endo/Other  diabetes, Type 2, Insulin Dependent, Oral Hypoglycemic AgentsObesity   Renal/GU Renal InsufficiencyRenal disease     Musculoskeletal  (+) Arthritis , Closed fracture of left hip   Abdominal   Peds  Hematology negative hematology ROS (+)   Anesthesia Other Findings Day of surgery medications reviewed with the patient.  Reproductive/Obstetrics                             Anesthesia Physical Anesthesia Plan Anesthesia Quick Evaluation

## 2017-11-10 NOTE — ED Notes (Signed)
This RN contacted [place] in regards to advanced directives for pt. This RN received a call back from [facility], Hospice RN and informed [place] of pt's current condition. Hospice RN informed this RN of pts DNR status and that signed documentation is in palliative care notebook. Notebook was not brought in with pt via [facility] but husband, Mitzi Hansen, made aware to bring notebook with him upon arrival in the morning. Mitzi Hansen unable to drive in the dark and being kept informed via phone of pts status. [place], EDP and Perley Jain notified of updated pt status and information.

## 2017-11-10 NOTE — H&P (Signed)
[facility] History and Physical  Melissa Myers UUV:[id number] DOB: 1945/12/16 DOA: 11/10/2017   PCP: [name], MD  Specialists: Dr. Burr Medico is her oncologist.  Chief Complaint: Fall and pain in the left hip area  HPI: Melissa Myers is a 71 y.o. 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  According to the patient she slipped and fell.  Denies any syncopal episode.  She was brought into the hospital.  She was found to have left hip fracture.  Pain is currently well controlled as long as she is not moving.  It was 10 out of 10 in intensity earlier.  Patient denies any shortness of breath or chest pain.  No nausea or vomiting.  At home she had been getting around without much difficulty prior to this fall.  In the emergency department she was found to have a left hip fracture on imaging studies.  Orthopedics was consulted.  Home Medications: Prior to Admission medications   Medication Sig Start Date End Date Taking? Authorizing Provider  aspirin EC 81 MG tablet Take 1 tablet (81 mg total) by mouth daily. 07/08/14   Rowe Clack, MD  baclofen (LIORESAL) 10 MG tablet Take 0.5 tablets (5 mg total) by mouth 3 (three) times daily. Patient taking differently: Take 10 mg by mouth 3 (three) times daily.  05/02/17   Tanner, Lyndon Code., PA-C  carvedilol (COREG) 25 MG tablet TAKE ONE TABLET TWICE DAILY WITH FOOD 01/25/17   Larey Dresser, MD  Cholecalciferol (VITAMIN D3) 2000 units capsule Take 1 capsule (2,000 Units total) by mouth daily. 06/17/15   Janith Lima, MD  Continuous Blood Gluc Sensor ([place]) MISC Use to check sugars 12/30/16   [name], MD  docusate sodium (COLACE) 100 MG capsule Take 1 capsule (100 mg total) by mouth 2 (two) times daily. 06/20/17 06/20/18   Lavina Hamman, MD  HYDROcodone-acetaminophen (NORCO/VICODIN) 5-325 MG tablet Take 1 tablet by mouth every 6 (six) hours as needed for moderate pain or severe pain. 06/20/17   Lavina Hamman, MD  insulin aspart (NOVOLOG) 100 UNIT/ML injection Inject 0-20 Units into the skin 3 (three) times daily with meals. . CBG 70 - 120: 0 units CBG 121 - 150: 3 units CBG 151 - 200: 4 units CBG 201 - 250: 7 units CBG 251 - 300: 11 units CBG 301 - 350: 15 units CBG 351 - 400: 20 units 03/21/16   Mariel Aloe, MD  insulin glargine (LANTUS) 100 UNIT/ML injection Inject 0.3 mLs (30 Units total) into the skin daily. 03/22/16   Mariel Aloe, MD  lidocaine (LIDODERM) 5 % Place 1 patch daily onto the skin. Remove & Discard patch within 12 hours or as directed by MD 12/30/16   [name], MD  lidocaine-prilocaine (EMLA) cream Apply to port site prior to being accessed 09/20/16   [provider]  lipase/protease/amylase (CREON) 36000 UNITS CPEP capsule 2 po prior to snacks; 4 po prior to meals 09/20/16   [provider]  metFORMIN (GLUCOPHAGE) 500 MG tablet Take 500 mg by mouth daily with breakfast.    [provider]  mirtazapine (REMERON) 15 MG tablet Take 1 tablet (15 mg total) by mouth at bedtime. 06/20/17   Lavina Hamman, MD  mupirocin ointment (BACTROBAN) 2 % Apply  1 application topically daily. 03/10/17   [name], MD  ondansetron (ZOFRAN) 8 MG tablet Take 1 tablet (8 mg total) by mouth every 8 (eight) hours as needed for nausea or vomiting. 01/10/17   Truitt Merle, MD  [facility] VERIO test strip USE AS INSTRUCTED UP TO 4 TIMES DAILY 11/03/16   [name], MD  oxyCODONE (OXY IR/ROXICODONE) 5 MG immediate release tablet Take 1 tablet (5 mg total) by mouth every 6 (six) hours as needed for severe pain. Patient not taking: Reported on 07/14/2017 07/12/17   Truitt Merle, MD  polyethylene glycol [facility] Floria Raveling) packet Take 17 g by mouth daily. 06/21/17   Lavina Hamman, MD  potassium chloride SA (K-DUR,KLOR-CON) 20 MEQ tablet Take 20 mEq by mouth daily.    [provider]  potassium chloride SA (K-DUR,KLOR-CON) 20 MEQ tablet TAKE TWO TABLETS TWICE DAILY 08/23/17   Larey Dresser, MD  pregabalin (LYRICA) 75 MG capsule Take 1 capsule (75 mg total) by mouth 2 (two) times daily. 06/29/17   [name], MD  rOPINIRole (REQUIP) 2 MG tablet Take 1 tablet (2 mg total) by mouth 2 (two) times daily. 06/20/17   Lavina Hamman, MD  sertraline (ZOLOFT) 50 MG tablet Take 50 mg by mouth daily.    [provider]  silver sulfADIAZINE (SILVADENE) 1 % cream Apply 1 application daily topically. 01/03/17   [name], MD  spironolactone (ALDACTONE) 25 MG tablet Take 1 tablet (25 mg total) by mouth daily. 06/17/16 11/24/17  Larey Dresser, MD  torsemide (DEMADEX) 100 MG tablet Take 50 mg by mouth daily.    [provider]  traMADol (ULTRAM) 50 MG tablet Take 1 tablet (50 mg total) by mouth every 6 (six) hours as needed. Patient taking differently: Take 50 mg every 6 (six) hours as needed by mouth for moderate pain.  11/03/16   [name], MD    Allergies:  Allergies  Allergen Reactions  . Morphine Other (See Comments) and Nausea Only    GI upset and headaches GI upset and headaches  . Statins Other (See Comments)    Statin drugs cause muscle pain / "muscle damage"--was told by MD not to take rhabdomyolysis Statin drugs cause muscle pain / "muscle damage"--was told by MD not to take  . Sulfa Antibiotics Diarrhea and Nausea Only  . Cymbalta [Duloxetine Hcl] Other (See Comments)    Restless leg syndrome Restless leg syndrome, rash, itching, and diarrhea  . Sulfasalazine Diarrhea  . Levemir [Insulin Detemir] Itching  . Zinc Swelling, Rash, Hives and Other (See Comments)    Past Medical History: Past Medical History:  Diagnosis Date  . Anemia   . Cancer [facility])    Pancreatic  . Cellulitis    LOWER  EXTREMITIES  . Chronic diarrhea    a/w nausea - felt related to IBS  . Deaf    left side only  . Diastolic CHF ([place])   . Disc degeneration, lumbar   . Family history of breast cancer   . Family history of ovarian cancer   . Family history of stomach cancer   . GERD (gastroesophageal reflux disease)   . Hyperlipidemia    hx rhabdo on statins  . Hypertension   . Neuropathy    feet, toes and fingers  . On home oxygen therapy    uses oxygen 2 liters min per [place] at night and prn during day  . OSA (obstructive sleep apnea)  05/2009 sleep study - refuses CPAP  . Osteoarthritis   . RLS (restless legs syndrome)   . Shortness of breath    chronic  . Stasis dermatitis   . Type II or unspecified type diabetes mellitus without mention of complication, not stated as uncontrolled    insulin dep    Past Surgical History:  Procedure Laterality Date  . BILIARY STENT PLACEMENT N/A 05/06/2016   Procedure: BILIARY STENT PLACEMENT;  Surgeon: Gatha Mayer, MD;  Location: [place];  Service: Endoscopy;  Laterality: N/A;  . CHOLECYSTECTOMY  1997  . COLONOSCOPY N/A 12/03/2012   Procedure: COLONOSCOPY;  Surgeon: Lafayette Dragon, MD;  Location: [facility];  Service: Endoscopy;  Laterality: N/A;  . ENDOSCOPIC RETROGRADE CHOLANGIOPANCREATOGRAPHY (ERCP) WITH PROPOFOL N/A 05/06/2016   Procedure: ENDOSCOPIC RETROGRADE CHOLANGIOPANCREATOGRAPHY (ERCP) WITH PROPOFOL;  Surgeon: Gatha Mayer, MD;  Location: [place];  Service: Endoscopy;  Laterality: N/A;  . ERCP N/A 03/18/2016   Procedure: ENDOSCOPIC RETROGRADE CHOLANGIOPANCREATOGRAPHY (ERCP);  Surgeon: Irene Shipper, MD;  Location[facility];  Service: Endoscopy;  Laterality: N/A;  . EUS N/A 04/28/2016   Procedure: UPPER ENDOSCOPIC ULTRASOUND (EUS) LINEAR;  Surgeon: [name], MD;  Location: [facility];  Service: Endoscopy;  Laterality: N/A;  . IR CHOLANGIOGRAM EXISTING TUBE  06/09/2016  . IR GENERIC HISTORICAL  05/07/2016   IR BILIARY DRAIN  PLACEMENT WITH CHOLANGIOGRAM 05/07/2016 Jacqulynn Cadet, MD [facility]-INTERV RAD  . IR GENERIC HISTORICAL  05/19/2016   IR BILIARY STENT(S) EXISTING ACCESS INC DILATION CATH EXCHANGE 05/19/2016 Jacqulynn Cadet, MD [facility]-INTERV RAD  . TONSILLECTOMY  1970  . TUBAL LIGATION  1980  . UMBILICAL HERNIA REPAIR  1995  . uterine polyp removal  2008    Social History: Patient lives with her husband.  Social History   Tobacco Use  . Smoking status: Never Smoker  . Smokeless tobacco: Never Used  Substance Use Topics  . Alcohol use: No  . Drug use: No     Family History:  Family History  Problem Relation Age of Onset  . Heart disease Mother   . Heart attack Mother 73  . Heart disease Father   . Heart attack Father 88  . Heart disease Unknown        family history  . Stomach cancer Paternal Grandmother   . Lung cancer Paternal Grandfather   . CVA Unknown        several aunts  . Heart attack Unknown        several aunts and an uncle  . Colon cancer Neg Hx      Review of Systems - History obtained from the patient General ROS: positive for  - fatigue Psychological ROS: negative Ophthalmic ROS: negative ENT ROS: negative Allergy and Immunology ROS: negative Hematological and Lymphatic ROS: negative Endocrine ROS: negative Respiratory ROS: no cough, shortness of breath, or wheezing Cardiovascular ROS: no chest pain or dyspnea on exertion Gastrointestinal ROS: no abdominal pain, change in bowel habits, or black or bloody stools Genito-Urinary ROS: no dysuria, trouble voiding, or hematuria Musculoskeletal ROS: negative Neurological ROS: no TIA or stroke symptoms Dermatological ROS: negative  Physical Examination  Vitals:   11/10/17 0800 11/10/17 0815 11/10/17 0830 11/10/17 0845  BP: (!) 126/55 (!) 131/57 (!) 115/56 (!) 113/53  Pulse: (!) 48 (!) 46 (!) 48 (!) 47  Resp: 12 20 18 12   SpO2: 96% 97% 97% 95%  Weight:      Height:        BP (!) 113/53  Pulse (!) 47   Resp 12   Ht 5'  1" (1.549 m)   Wt 86.2 kg   SpO2 95%   BMI 35.90 kg/m   General appearance: alert, cooperative, appears stated age, icteric and no distress Head: Normocephalic, without obvious abnormality, atraumatic Eyes: Icteric sclera Throat: lips, mucosa, and tongue normal; teeth and gums normal Neck: no adenopathy, no carotid bruit, no JVD, supple, symmetrical, trachea midline and thyroid not enlarged, symmetric, no tenderness/mass/nodules Resp: clear to auscultation bilaterally Cardio: S1-S2 is normal regular.  Bradycardic.  Systolic murmur appreciated over the precordium.  No S3-S4. GI: Abdomen is mildly distended.  Soft.  Vaguely tender in the upper abdomen without any rebound rigidity or guarding.  No masses organomegaly.  Bowel sounds are present. Extremities: Left lower extremity is externally rotated Pulses: 2+ and symmetric Skin: Skin color, texture, turgor normal. No rashes or lesions Lymph nodes: Cervical, supraclavicular, and axillary nodes normal. Neurologic: No obvious focal neurological deficits.   Labs on Admission: I have personally reviewed following labs and imaging studies  CBC: Recent Labs  Lab 11/10/17 0445 11/10/17 0452  WBC 9.8  --   NEUTROABS 6.9  --   HGB 11.3* 12.6  HCT 33.9* 37.0  MCV 80.7  --   PLT 224  --    Basic Metabolic Panel: Recent Labs  Lab 11/10/17 0445 11/10/17 0452  NA 133* 134*  K 4.4 4.4  CL 92* 94*  CO2 29  --   GLUCOSE 257* 259*  BUN 47* 49*  CREATININE 1.51* 1.60*  CALCIUM 8.5*  --   MG 1.9  --    GFR: Estimated Creatinine Clearance: 31.7 mL/min (A) (by C-G formula based on SCr of 1.6 mg/dL (H)). Liver Function Tests: Recent Labs  Lab 11/10/17 0445  AST 82*  ALT 40  ALKPHOS 421*  BILITOT 15.4*  PROT 6.3*  ALBUMIN 2.1*   Recent Labs  Lab 11/10/17 0445  LIPASE 16   Coagulation Profile: Recent Labs  Lab 11/10/17 0445  INR 1.31     Radiological Exams on Admission: Dg Chest 1 View  Result Date:  11/10/2017 CLINICAL DATA:  Preoperative evaluation for hip fracture. EXAM: CHEST  1 VIEW COMPARISON:  Prior radiograph from 07/16/2017. FINDINGS: Right-sided Port-A-Cath in place with tip overlying the cavoatrial junction. Stable cardiomegaly. Mediastinal silhouette normal. Lungs normally inflated. No focal infiltrates. No pulmonary edema or pleural effusion. No pneumothorax. No acute osseous abnormality. IMPRESSION: No active cardiopulmonary disease. Electronically Signed   By: Jeannine Boga M.D.   On: 11/10/2017 06:32   Dg Hip Unilat W Or Wo Pelvis 2-3 Views Left  Result Date: 11/10/2017 CLINICAL DATA:  Initial evaluation for acute trauma, fall. EXAM: DG HIP (WITH OR WITHOUT PELVIS) 2-3V LEFT COMPARISON:  None. FINDINGS: Acute comminuted intertrochanteric fracture of the left femur with slight superior subluxation. Femoral head remains normally position within the acetabulum. Bony pelvis intact. Limited views of the right hip demonstrate no acute osseous abnormality. Bones are diffusely osteopenic. Prominent degenerative changes within the lower lumbar spine. No acute soft tissue abnormality. IMPRESSION: Acute comminuted intertrochanteric fracture of the left hip. Electronically Signed   By: Jeannine Boga M.D.   On: 11/10/2017 06:31    My interpretation of Electrocardiogram: Sinus bradycardia in the 40s.  RBBB.  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   Problem List  Principal Problem:   Closed left hip fracture [facility]) Active Problems:   Essential hypertension   Chronic diastolic heart failure ([place])  IDDM (insulin dependent diabetes mellitus) (HCC)   Chronic pain syndrome   Murmur, cardiac   Malignant neoplasm of head of pancreas (HCC)   Assessment: This is a 72 year old Caucasian female with past medical history as stated earlier but specifically significant for terminal pancreatic cancer under hospice care presents after mechanical fall.  She has sustained  left hip fracture.  She will be hospitalized for further management.  Plan: #1 Left hip fracture: Orthopedics has been consulted.  Patient with terminal pancreatic cancer.  She has significantly elevated bilirubin.  She has history of diastolic congestive heart failure.  Systolic function was normal based on echo done earlier this year.  No significant valvular disease although the patient does have a murmur on examination.  Patient will be considered high risk for perioperative complications due to her cardiac history and cancer but at the same time there is no intervention that can mitigate this risk.  Surgical intervention will be mainly for pain management and quality of life.  It does appear that patient was somewhat functional prior to her fall.  Discussed with the patient.  Further management per orthopedics.  #2 Terminal pancreatic cancer with jaundice: Followed by oncology previously.  Chemotherapy was stopped over the summer as there was no benefit that was seen.  Patient was transitioned to hospice care at home.  Continue pain medications.  #3  Insulin-dependent diabetes mellitus: No recent HbA1c in our system.  Hyperglycemia is noted.  Continue with Lantus at lower dose as she will be n.p.o.  Sliding scale coverage.  #4  Chronic pain syndrome: Continue with home medications.  #5  Chronic diastolic CHF: Appears to be well compensated.  She is noted to have bradycardia.  She is noted to be on beta-blocker at home which will be held due to the bradycardia.  Monitor telemetry for now.  TSH was normal back in April.  #6  Restless leg syndrome: Continue with home medications.  #7  History of cardiac murmur echocardiogram done in April did not show any significant valvular disease.  DVT Prophylaxis: SCDs for now Code Status: DNR Family Communication: Discussed with the patient Consults called: Orthopedics  Severity of Illness: The appropriate patient status for this patient is INPATIENT.  Inpatient status is judged to be reasonable and necessary in order to provide the required intensity of service to ensure the patient's safety. The patient's presenting symptoms, physical exam findings, and initial radiographic and laboratory data in the context of their chronic comorbidities is felt to place them at high risk for further clinical deterioration. Furthermore, it is not anticipated that the patient will be medically stable for discharge from the hospital within 2 midnights of admission. The following factors support the patient status of inpatient.   " The patient's presenting symptoms include left hip pain. " The worrisome physical exam findings include externally rotated left lower extremity. " The initial radiographic and laboratory data are worrisome because of left hip fracture.  Hyperbilirubinemia. " The chronic co-morbidities include terminal pancreatic cancer.   * I certify that at the point of admission it is my clinical judgment that the patient will require inpatient hospital care spanning beyond 2 midnights from the point of admission due to high intensity of service, high risk for further deterioration and high frequency of surveillance required.*    Further management decisions will depend on results of further testing and patient's response to treatment.   Bonnielee Haff  [facility] Pager [phone]  If 7PM-7AM, please contact night-coverage [email or web address]  Password [email or web address]  11/10/2017, 10:08 AM

## 2017-11-10 NOTE — Care Management (Signed)
This is a no charge note  Pending admission per Dr. Randal Buba   72 year old lady with a past medical history of pancreatic cancer on hospice care, hypertension, hyperlipidemia, diabetes mellitus, GERD, depression, CHF, who presents with fall, and left hip fracture.  Dr. Nicholes Stairs will consult orthopedic surgeon. Admitted to tele bed as inpt   Ivor Costa, MD  [facility] Pager [phone]  If 7PM-7AM, please contact night-coverage [email or web address] Password [email or web address] 11/10/2017, 6:18 AM

## 2017-11-10 NOTE — Clinical Social Work Note (Signed)
Clinical Social Work Assessment  Patient Details  Name: Melissa Myers MRN: [id number] Date of Birth: 01/29/1946  Date of referral:  11/10/17               Reason for consult:  Facility Placement, Discharge Planning                Permission sought to share information with:  Other Permission granted to share information::  No  Name::     none given  Agency::  none given  Relationship::  none given  Contact Information:  none given   Housing/Transportation Living arrangements for the past 2 months:  Single Family Home(with spouse ) Source of Information:  Patient Patient Interpreter Needed:  None Criminal Activity/Legal Involvement Pertinent to Current Situation/Hospitalization:  No - Comment as needed Significant Relationships:  Adult Children, Spouse Lives with:  Spouse Do you feel safe going back to the place where you live?  Yes Need for family participation in patient care:  Yes (Comment)  Care giving concerns:  CSW consulted for possible SNF placement at the time of discharge.    Social Worker assessment / plan:  CSW spoke with pt at bedside. CSW was advised that pt is from home with spouse. Pt reports that pt has support form husband and adult son at this time. Pt expressed being agreeable to SNF placement at the time of discharge if needed.   During this assessment pt was lying in bed. Pt spoke with a calm voice and appeared to be in no distress at this time.   Employment status:    Insurance information:  Other (Comment Required) PT Recommendations:  Not assessed at this time Information / Referral to community resources:  [place]  Patient/Family's Response to care:  Pt's response to care appeared to be understanding and agreeable to plan of care at this time.   Patient/Family's Understanding of and Emotional Response to Diagnosis, Current Treatment, and Prognosis:  No further questions or concerns have been presented to CSW at this time.    Emotional Assessment Appearance:  Appears stated age Attitude/Demeanor/Rapport:  Engaged Affect (typically observed):  Accepting Orientation:  Oriented to Self, Oriented to Place, Oriented to Situation Alcohol / Substance use:  Not Applicable Psych involvement (Current and /or in the community):  No (Comment)  Discharge Needs  Concerns to be addressed:  Denies Needs/Concerns at this time Readmission within the last 30 days:    Current discharge risk:  Dependent with Mobility Barriers to Discharge:  Continued Medical Work up   [employer], [place] 11/10/2017, 8:31 AM

## 2017-11-10 NOTE — Consult Note (Signed)
Reason for Consult:Left hip fx Referring Physician: A Palumbo  Melissa Myers is an 72 y.o. female.  HPI: Melissa Myers was at home and seems to have had a syncopal event. She woke up on the floor with her left hip hurting and unable to get up. She was brought to the ED where x-rays showed a left hip fx and orthopedic surgery was consulted. She lives at home with her husband.  Past Medical History:  Diagnosis Date  . Anemia   . Cancer [facility])    Pancreatic  . Cellulitis    LOWER EXTREMITIES  . Chronic diarrhea    a/w nausea - felt related to IBS  . Deaf    left side only  . Diastolic CHF ([place])   . Disc degeneration, lumbar   . Family history of breast cancer   . Family history of ovarian cancer   . Family history of stomach cancer   . GERD (gastroesophageal reflux disease)   . Hyperlipidemia    hx rhabdo on statins  . Hypertension   . Neuropathy    feet, toes and fingers  . On home oxygen therapy    uses oxygen 2 liters min per [place] at night and prn during day  . OSA (obstructive sleep apnea)    05/2009 sleep study - refuses CPAP  . Osteoarthritis   . RLS (restless legs syndrome)   . Shortness of breath    chronic  . Stasis dermatitis   . Type II or unspecified type diabetes mellitus without mention of complication, not stated as uncontrolled    insulin dep    Past Surgical History:  Procedure Laterality Date  . BILIARY STENT PLACEMENT N/A 05/06/2016   Procedure: BILIARY STENT PLACEMENT;  Surgeon: Gatha Mayer, MD;  Location: [place];  Service: Endoscopy;  Laterality: N/A;  . CHOLECYSTECTOMY  1997  . COLONOSCOPY N/A 12/03/2012   Procedure: COLONOSCOPY;  Surgeon: Lafayette Dragon, MD;  Location: [facility];  Service: Endoscopy;  Laterality: N/A;  . ENDOSCOPIC RETROGRADE CHOLANGIOPANCREATOGRAPHY (ERCP) WITH PROPOFOL N/A 05/06/2016   Procedure: ENDOSCOPIC RETROGRADE CHOLANGIOPANCREATOGRAPHY (ERCP) WITH PROPOFOL;  Surgeon: Gatha Mayer, MD;  Location: [place];   Service: Endoscopy;  Laterality: N/A;  . ERCP N/A 03/18/2016   Procedure: ENDOSCOPIC RETROGRADE CHOLANGIOPANCREATOGRAPHY (ERCP);  Surgeon: Irene Shipper, MD;  Location: [facility];  Service: Endoscopy;  Laterality: N/A;  . EUS N/A 04/28/2016   Procedure: UPPER ENDOSCOPIC ULTRASOUND (EUS) LINEAR;  Surgeon: [name], MD;  Location: [facility];  Service: Endoscopy;  Laterality: N/A;  . IR CHOLANGIOGRAM EXISTING TUBE  06/09/2016  . IR GENERIC HISTORICAL  05/07/2016   IR BILIARY DRAIN PLACEMENT WITH CHOLANGIOGRAM 05/07/2016 Jacqulynn Cadet, MD [facility]  . IR GENERIC HISTORICAL  05/19/2016   IR BILIARY STENT(S) EXISTING ACCESS INC DILATION CATH EXCHANGE 05/19/2016 Jacqulynn Cadet, MD [facility]  . TONSILLECTOMY  1970  . TUBAL LIGATION  1980  . UMBILICAL HERNIA REPAIR  1995  . uterine polyp removal  2008    Family History  Problem Relation Age of Onset  . Heart disease Mother   . Heart attack Mother 28  . Heart disease Father   . Heart attack Father 41  . Heart disease Unknown        family history  . Stomach cancer Paternal Grandmother   . Lung cancer Paternal Grandfather   . CVA Unknown        several aunts  . Heart attack Unknown  several aunts and an uncle  . Colon cancer Neg Hx     Social History:  reports that she has never smoked. She has never used smokeless tobacco. She reports that she does not drink alcohol or use drugs.  Allergies:  Allergies  Allergen Reactions  . Morphine Other (See Comments) and Nausea Only    GI upset and headaches GI upset and headaches  . Statins Other (See Comments)    Statin drugs cause muscle pain / "muscle damage"--was told by MD not to take rhabdomyolysis Statin drugs cause muscle pain / "muscle damage"--was told by MD not to take  . Sulfa Antibiotics Diarrhea and Nausea Only  . Cymbalta [Duloxetine Hcl] Other (See Comments)    Restless leg syndrome Restless leg syndrome, rash, itching, and diarrhea  . Sulfasalazine  Diarrhea  . Levemir [Insulin Detemir] Itching  . Zinc Swelling, Rash, Hives and Other (See Comments)    Medications: I have reviewed the patient's current medications.  Results for orders placed or performed during the hospital encounter of 11/10/17 (from the past 48 hour(s))  CBC with Differential/Platelet     Status: Abnormal   Collection Time: 11/10/17  4:45 AM  Result Value Ref Range   WBC 9.8 4.0 - 10.5 K/uL   RBC 4.20 3.87 - 5.11 MIL/uL   Hemoglobin 11.3 (L) 12.0 - 15.0 g/dL   HCT 33.9 (L) 36.0 - 46.0 %   MCV 80.7 78.0 - 100.0 fL   MCH 26.9 26.0 - 34.0 pg   MCHC 33.3 30.0 - 36.0 g/dL   RDW 22.1 (H) 11.5 - 15.5 %   Platelets 224 150 - 400 K/uL   Neutrophils Relative % 71 %   Lymphocytes Relative 20 %   Monocytes Relative 7 %   Eosinophils Relative 1 %   Basophils Relative 1 %   Neutro Abs 6.9 1.7 - 7.7 K/uL   Lymphs Abs 2.0 0.7 - 4.0 K/uL   Monocytes Absolute 0.7 0.1 - 1.0 K/uL   Eosinophils Absolute 0.1 0.0 - 0.7 K/uL   Basophils Absolute 0.1 0.0 - 0.1 K/uL   RBC Morphology TARGET CELLS     Comment: BASOPHILIC STIPPLING Performed at [facility], [address], [place], [place] [address]   Comprehensive metabolic panel     Status: Abnormal   Collection Time: 11/10/17  4:45 AM  Result Value Ref Range   Sodium 133 (L) 135 - 145 mmol/L   Potassium 4.4 3.5 - 5.1 mmol/L   Chloride 92 (L) 98 - 111 mmol/L   CO2 29 22 - 32 mmol/L   Glucose, Bld 257 (H) 70 - 99 mg/dL   BUN 47 (H) 8 - 23 mg/dL   Creatinine, Ser 1.51 (H) 0.44 - 1.00 mg/dL   Calcium 8.5 (L) 8.9 - 10.3 mg/dL   Total Protein 6.3 (L) 6.5 - 8.1 g/dL   Albumin 2.1 (L) 3.5 - 5.0 g/dL   AST 82 (H) 15 - 41 U/L   ALT 40 0 - 44 U/L   Alkaline Phosphatase 421 (H) 38 - 126 U/L   Total Bilirubin 15.4 (H) 0.3 - 1.2 mg/dL   GFR calc non Af Amer 33 (L) >60 mL/min   GFR calc Af Amer 39 (L) >60 mL/min    Comment: (NOTE) The eGFR has been calculated using the CKD EPI equation. This calculation has not been  validated in all clinical situations. eGFR's persistently <60 mL/min signify possible Chronic Kidney Disease.    Anion gap 12 5 -  15    Comment: Performed at [facility], [place] [address] [place], [place] [address]  Lipase, blood     Status: None   Collection Time: 11/10/17  4:45 AM  Result Value Ref Range   Lipase 16 11 - 51 U/L    Comment: Performed at [facility] [address] [place], [place] [address]  Protime-INR     Status: Abnormal   Collection Time: 11/10/17  4:45 AM  Result Value Ref Range   Prothrombin Time 16.2 (H) 11.4 - 15.2 seconds   INR 1.31     Comment: Performed at [facility] [address] [place], [place] [address]  Magnesium     Status: None   Collection Time: 11/10/17  4:45 AM  Result Value Ref Range   Magnesium 1.9 1.7 - 2.4 mg/dL    Comment: Performed at [facility] [address] [place], [place] [address]  I-stat chem 8, ed     Status: Abnormal   Collection Time: 11/10/17  4:52 AM  Result Value Ref Range   Sodium 134 (L) 135 - 145 mmol/L   Potassium 4.4 3.5 - 5.1 mmol/L   Chloride 94 (L) 98 - 111 mmol/L   BUN 49 (H) 8 - 23 mg/dL   Creatinine, Ser 1.60 (H) 0.44 - 1.00 mg/dL   Glucose, Bld 259 (H) 70 - 99 mg/dL   Calcium, Ion 1.03 (L) 1.15 - 1.40 mmol/L   TCO2 32 22 - 32 mmol/L   Hemoglobin 12.6 12.0 - 15.0 g/dL   HCT 37.0 36.0 - 46.0 %  I-stat troponin, ED     Status: None   Collection Time: 11/10/17  4:54 AM  Result Value Ref Range   Troponin i, poc 0.00 0.00 - 0.08 ng/mL   Comment 3            Comment: Due to the release kinetics of cTnI, a negative result within the first hours of the onset of symptoms does not rule out myocardial infarction with certainty. If myocardial infarction is still suspected, repeat the test at appropriate intervals.    *Note: Due to a large number of results and/or encounters for the requested time period, some results have not been displayed. A complete set of results can be  found in Results Review.    Dg Chest 1 View  Result Date: 11/10/2017 CLINICAL DATA:  Preoperative evaluation for hip fracture. EXAM: CHEST  1 VIEW COMPARISON:  Prior radiograph from 07/16/2017. FINDINGS: Right-sided Port-A-Cath in place with tip overlying the cavoatrial junction. Stable cardiomegaly. Mediastinal silhouette normal. Lungs normally inflated. No focal infiltrates. No pulmonary edema or pleural effusion. No pneumothorax. No acute osseous abnormality. IMPRESSION: No active cardiopulmonary disease. Electronically Signed   By: Jeannine Boga M.D.   On: 11/10/2017 06:32   Dg Hip Unilat W Or Wo Pelvis 2-3 Views Left  Result Date: 11/10/2017 CLINICAL DATA:  Initial evaluation for acute trauma, fall. EXAM: DG HIP (WITH OR WITHOUT PELVIS) 2-3V LEFT COMPARISON:  None. FINDINGS: Acute comminuted intertrochanteric fracture of the left femur with slight superior subluxation. Femoral head remains normally position within the acetabulum. Bony pelvis intact. Limited views of the right hip demonstrate no acute osseous abnormality. Bones are diffusely osteopenic. Prominent degenerative changes within the lower lumbar spine. No acute soft tissue abnormality. IMPRESSION: Acute comminuted intertrochanteric fracture of the left hip. Electronically Signed   By: Jeannine Boga M.D.   On: 11/10/2017 06:31    Review  of Systems  Constitutional: Negative for weight loss.  HENT: Negative for ear discharge, ear pain, hearing loss and tinnitus.   Eyes: Negative for blurred vision, double vision, photophobia and pain.  Respiratory: Negative for cough, sputum production and shortness of breath.   Cardiovascular: Negative for chest pain.  Gastrointestinal: Negative for abdominal pain, nausea and vomiting.  Genitourinary: Negative for dysuria, flank pain, frequency and urgency.  Musculoskeletal: Positive for joint pain (Left hip). Negative for back pain, falls, myalgias and neck pain.  Neurological:  Negative for dizziness, tingling, sensory change, focal weakness, loss of consciousness and headaches.  Endo/Heme/Allergies: Does not bruise/bleed easily.  Psychiatric/Behavioral: Negative for depression, memory loss and substance abuse. The patient is not nervous/anxious.    Blood pressure (!) 113/53, pulse (!) 47, resp. rate 12, height 5' 1"  (1.549 m), weight 86.2 kg, SpO2 95 %. Physical Exam  Constitutional: She appears well-developed and well-nourished. No distress.  HENT:  Head: Normocephalic and atraumatic.  Eyes: Conjunctivae are normal. Right eye exhibits no discharge. Left eye exhibits no discharge. No scleral icterus.  Neck: Normal range of motion.  Cardiovascular: Normal rate and regular rhythm.  Respiratory: Effort normal. No respiratory distress.  Musculoskeletal:  LLE No traumatic wounds, ecchymosis, or rash  TTP hip, leg shortened  No knee or ankle effusion  Sens SPN, TN intact, DPN absent  Motor EHL, ext, flex, evers 5/5  DP 2+, PT 2+, No significant edema  Neurological: She is alert.  Skin: Skin is warm and dry. She is not diaphoretic.  Psychiatric: She has a normal mood and affect. Her behavior is normal.    Assessment/Plan: Left hip fx -- For IMN this afternoon by Dr. Griffin Basil. Please keep NPO until then. Spoke with Dr. [place] Pink who said she would be high risk but no optimization to be done before surgery. Appreciate their input. Multiple medical problems including CHF, chronic pain, DM, HLD, HTN, hx/o panc ca, obesity, RLS, OSA, and depression    Lisette Abu, PA-C Orthopedic Surgery [phone] 11/10/2017, 9:00 AM

## 2017-11-10 NOTE — ED Triage Notes (Signed)
BIB [facility] after a fall getting out of bed. Pain to left hip with shortening and rotation  of left leg. Pulses and good color noted on arrival. Hospice pt for pancreatic cancer. Received 252mcgs fentanyl in route.

## 2017-11-10 NOTE — Progress Notes (Signed)
Called facility and gave report to Arbie Cookey, all questions and concerns addressed, premedicated prior to transport. Discharged to facility with belongings via [place].

## 2017-11-10 NOTE — ED Notes (Signed)
Patient transported to X-ray via Biomedical scientist upon approval from [place], [place]. Museum/gallery conservator, [place] made aware of pt's condition and that all scans might not be able to be completed due to pt pain level.

## 2017-11-10 NOTE — Progress Notes (Addendum)
[facility] [place]:  Pt is a current pt with hospice services provided by [facility]. She is alert oriented and able to walk independently prior to this fall. She has a good appetite and does have hypoglycemia at times. She does have SOB with exertion and decreased endurance. She was a DNR with our agency and had one in home. After review with our medical Director this is felt to be a related hospice admission. Please call if you have any questions we will continue to follow pt through this hospitalization and help with any discharge planning needed. Thank you for caring for our pt to help with her comfort r/t this unfortunate fall.  Plan of care and face sheet placed on pt's chart for review and your records. [facility] Liaison  [phone]

## 2017-11-10 NOTE — Plan of Care (Signed)

## 2017-11-10 NOTE — Progress Notes (Signed)
Nutrition Brief Note  RD consulted under hip fracture protocol.  Pt under residential hospice care at [facility].  She does not wish to have supplements.  No interventions warranted at this time.   She is to discharge today.   Mariana Single RD, LDN Clinical Nutrition Pager # [phone]

## 2017-11-10 NOTE — Discharge Summary (Signed)
[facility]  Physician Discharge Summary   Patient ID: Melissa Myers MRN: [id number] DOB/AGE: 1946-02-06 72 y.o.  Admit date: 11/10/2017 Discharge date: 11/10/2017  PCP: [name], MD  DISCHARGE DIAGNOSES:  Left hip fracture Terminal pancreatic cancer Diastolic CHF, chronic  RECOMMENDATIONS FOR OUTPATIENT FOLLOW UP: 1. Patient being discharged to residential hospice at [place]: Guarded prognosis  Diet recommendation: Comfort feeds  Filed Weights   11/10/17 0438  Weight: 86.2 kg    INITIAL HISTORY: Melissa Myers is a 72 y.o. 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  According to the patient she slipped and fell.  Denies any syncopal episode.  She was brought into the hospital.  She was found to have left hip fracture.  Pain is currently well controlled as long as she is not moving.  It was 10 out of 10 in intensity earlier.  Patient denies any shortness of breath or chest pain.  No nausea or vomiting.  At home she had been getting around without much difficulty prior to this fall.  Consultations:  Orthopedics  Procedures:  None   HOSPITAL COURSE:   Left hip fracture Orthopedics was consulted.  Patient with terminal pancreatic cancer.  She has significantly elevated bilirubin.  She has history of diastolic congestive heart failure.  Systolic function was normal based on echo done earlier this year.  No significant valvular disease although the patient does have a murmur on examination.  Patient considered high risk for perioperative complications due to her cardiac history and cancer but at the same time there is no intervention that can mitigate this risk.  Surgical intervention will be mainly for pain management and quality of life.  Further  discussions between orthopedics and patient and her family.  They have declined surgical intervention at this time.  Main goal is pain control and comfort.  In view of this hospice was contacted.  They have discussed with family and they have a bed available today at the hospice facility.  Family and patient agreed to go there.  Terminal pancreatic cancer with jaundice Followed by oncology previously.  Chemotherapy was stopped over the summer as there was no benefit that was seen.  Patient was transitioned to hospice care at home.    Insulin-dependent diabetes mellitus No recent HbA1c in our system.    Continue with home medication regimen.  Further management deferred to hospice providers.    Chronic pain syndrome Continue with home medications.  Chronic diastolic CHF Appears to be well compensated.  She is noted to have bradycardia.  She is noted to be on beta-blocker at home which will be stopped due to the bradycardia. TSH was normal back in April.  Restless leg syndrome Continue with home medications.  History of cardiac murmur Echocardiogram done in April did not show any significant valvular disease.  Okay for discharge to residential hospice today.   PERTINENT LABS:  The results of significant diagnostics from this hospitalization (including imaging, microbiology, ancillary and laboratory) are listed below for reference.     Labs: Basic Metabolic Panel: Recent Labs  Lab 11/10/17 0445 11/10/17 0452  NA 133* 134*  K 4.4 4.4  CL 92* 94*  CO2 29  --   GLUCOSE 257* 259*  BUN 47* 49*  CREATININE 1.51* 1.60*  CALCIUM 8.5*  --  MG 1.9  --    Liver Function Tests: Recent Labs  Lab 11/10/17 0445  AST 82*  ALT 40  ALKPHOS 421*  BILITOT 15.4*  PROT 6.3*  ALBUMIN 2.1*   Recent Labs  Lab 11/10/17 0445  LIPASE 16   CBC: Recent Labs  Lab 11/10/17 0445 11/10/17 0452  WBC 9.8  --   NEUTROABS 6.9  --   HGB 11.3* 12.6  HCT 33.9* 37.0  MCV 80.7  --     PLT 224  --     CBG: Recent Labs  Lab 11/10/17 1133  GLUCAP 218*     IMAGING STUDIES Dg Chest 1 View  Result Date: 11/10/2017 CLINICAL DATA:  Preoperative evaluation for hip fracture. EXAM: CHEST  1 VIEW COMPARISON:  Prior radiograph from 07/16/2017. FINDINGS: Right-sided Port-A-Cath in place with tip overlying the cavoatrial junction. Stable cardiomegaly. Mediastinal silhouette normal. Lungs normally inflated. No focal infiltrates. No pulmonary edema or pleural effusion. No pneumothorax. No acute osseous abnormality. IMPRESSION: No active cardiopulmonary disease. Electronically Signed   By: Jeannine Boga M.D.   On: 11/10/2017 06:32   Dg Hip Unilat W Or Wo Pelvis 2-3 Views Left  Result Date: 11/10/2017 CLINICAL DATA:  Initial evaluation for acute trauma, fall. EXAM: DG HIP (WITH OR WITHOUT PELVIS) 2-3V LEFT COMPARISON:  None. FINDINGS: Acute comminuted intertrochanteric fracture of the left femur with slight superior subluxation. Femoral head remains normally position within the acetabulum. Bony pelvis intact. Limited views of the right hip demonstrate no acute osseous abnormality. Bones are diffusely osteopenic. Prominent degenerative changes within the lower lumbar spine. No acute soft tissue abnormality. IMPRESSION: Acute comminuted intertrochanteric fracture of the left hip. Electronically Signed   By: Jeannine Boga M.D.   On: 11/10/2017 06:31    DISPOSITION: hospice  Discharge Instructions    Discharge instructions   Complete by:  As directed    You were cared for by a hospitalist during your hospital stay. If you have any questions about your discharge medications or the care you received while you were in the hospital after you are discharged, you can call the unit and asked to speak with the hospitalist on call if the hospitalist that took care of you is not available. Once you are discharged, your primary care physician will handle any further medical issues. Please  note that NO REFILLS for any discharge medications will be authorized once you are discharged, as it is imperative that you return to your primary care physician (or establish a relationship with a primary care physician if you do not have one) for your aftercare needs so that they can reassess your need for medications and monitor your lab values. If you do not have a primary care physician, you can call [phone] for a physician referral.   Increase activity slowly   Complete by:  As directed         Allergies as of 11/10/2017      Reactions   Morphine Nausea Only, Other (See Comments)   GI upset and headaches   Statins Other (See Comments)   Statin drugs cause muscle pain / "muscle damage"--was told by MD not to take rhabdomyolysis   Sulfa Antibiotics Diarrhea, Nausea Only   Cymbalta [duloxetine Hcl] Other (See Comments)   Restless leg syndrome Restless leg syndrome, rash, itching, and diarrhea   Sulfasalazine Diarrhea   Oxycodone Other (See Comments)   insomnia   Levemir [insulin Detemir] Itching   Zinc Hives, Swelling, Rash  Medication List    STOP taking these medications   aspirin EC 81 MG tablet   carvedilol 25 MG tablet Commonly known as:  COREG   docusate sodium 100 MG capsule Commonly known as:  COLACE   HYDROcodone-acetaminophen 5-325 MG tablet Commonly known as:  NORCO/VICODIN   metFORMIN 500 MG 24 hr tablet Commonly known as:  GLUCOPHAGE-XR   mupirocin ointment 2 % Commonly known as:  BACTROBAN   oxyCODONE 5 MG immediate release tablet Commonly known as:  Oxy IR/ROXICODONE   silver sulfADIAZINE 1 % cream Commonly known as:  SILVADENE   traMADol 50 MG tablet Commonly known as:  ULTRAM     TAKE these medications   acetaminophen 500 MG tablet Commonly known as:  TYLENOL Take 1,000 mg by mouth every 6 (six) hours as needed for headache (pain).   baclofen 10 MG tablet Commonly known as:  LIORESAL Take 0.5 tablets (5 mg total) by mouth 3  (three) times daily. What changed:    how much to take  when to take this  reasons to take this   [place] Use to check sugars   gabapentin 300 MG capsule Commonly known as:  NEURONTIN Take 600 mg by mouth 4 (four) times daily.   GLUCAGON EMERGENCY 1 MG injection Generic drug:  glucagon Inject 1 mg into the skin See admin instructions. Inject 1 mg subcutaneously every 15 minutes as needed for blood sugars below 50   Heparin & NaCl Lock Flush 10-0.9 UNIT/ML-% Kit Inject 5 mLs into the vein See admin instructions. Give 5 mls via IV flush once every 4-6 weeks following NS flush or as needed to prevent clotting per protocol IV   HYDROmorphone 2 MG tablet Commonly known as:  DILAUDID Take 1-2 mg by mouth every 4 (four) hours as needed (pain/shortness of breath). What changed:  Another medication with the same name was added. Make sure you understand how and when to take each.   HYDROmorphone 1 MG/ML injection Commonly known as:  DILAUDID Inject 0.5-1 mLs (0.5-1 mg total) into the vein every 3 (three) hours as needed for severe pain. What changed:  You were already taking a medication with the same name, and this prescription was added. Make sure you understand how and when to take each.   hydrOXYzine 25 MG tablet Commonly known as:  ATARAX/VISTARIL Take 25-50 mg by mouth every 6 (six) hours as needed for itching.   insulin aspart 100 UNIT/ML injection Commonly known as:  novoLOG Inject 0-20 Units into the skin 3 (three) times daily with meals. . CBG 70 - 120: 0 units CBG 121 - 150: 3 units CBG 151 - 200: 4 units CBG 201 - 250: 7 units CBG 251 - 300: 11 units CBG 301 - 350: 15 units CBG 351 - 400: 20 units What changed:    how much to take  additional instructions   insulin glargine 100 UNIT/ML injection Commonly known as:  LANTUS Inject 0.3 mLs (30 Units total) into the skin daily. What changed:    how much to take  when to take  this  additional instructions   lidocaine 5 % Commonly known as:  LIDODERM Place 1 patch daily onto the skin. Remove & Discard patch within 12 hours or as directed by MD   lidocaine-prilocaine cream Commonly known as:  EMLA Apply 1 application topically as needed (one hour prior to port a cath access).   lipase/protease/amylase 36000 UNITS Cpep capsule Commonly known as:  CREON Take 72,000-144,000 Units by mouth See admin instructions. Take 4 capsules (144,000 units) by mouth three times daily before meals, and 2 capsules (72,000 units) before snacks.   mirtazapine 15 MG tablet Commonly known as:  REMERON Take 1 tablet (15 mg total) by mouth at bedtime.   Normal Saline Flush 0.9 % Soln Inject 20 mLs into the vein See admin instructions. Give 20 ml via IV flush once every 4-6 weeks or as needed to prevent clotting per protocol IV   ondansetron 8 MG tablet Commonly known as:  ZOFRAN Take 1 tablet (8 mg total) by mouth every 8 (eight) hours as needed for nausea or vomiting. What changed:  when to take this   ONETOUCH VERIO test strip Generic drug:  glucose blood USE AS INSTRUCTED UP TO 4 TIMES DAILY   OXYGEN Inhale 4 L into the lungs as needed (shortness of breath).   polyethylene glycol packet Commonly known as:  MIRALAX / GLYCOLAX Take 17 g by mouth daily. Start taking on:  11/11/2017   potassium chloride SA 20 MEQ tablet Commonly known as:  K-DUR,KLOR-CON Take 20 mEq by mouth daily. What changed:  Another medication with the same name was removed. Continue taking this medication, and follow the directions you see here.   pregabalin 75 MG capsule Commonly known as:  LYRICA Take 1 capsule (75 mg total) by mouth 2 (two) times daily.   rOPINIRole 2 MG tablet Commonly known as:  REQUIP Take 1 tablet (2 mg total) by mouth 2 (two) times daily.   sertraline 50 MG tablet Commonly known as:  ZOLOFT Take 50 mg by mouth daily.   spironolactone 25 MG tablet Commonly known  as:  ALDACTONE Take 1 tablet (25 mg total) by mouth daily.   torsemide 20 MG tablet Commonly known as:  DEMADEX Take 40 mg by mouth 2 (two) times daily.   traZODone 50 MG tablet Commonly known as:  DESYREL Take 50 mg by mouth at bedtime.   Vitamin D3 2000 units capsule Take 1 capsule (2,000 Units total) by mouth daily.          TOTAL DISCHARGE TIME: 35 mins  Bonnielee Haff  [facility] Pager [phone]  11/10/2017, 3:02 PM

## 2017-11-17 ENCOUNTER — Telehealth: Payer: Self-pay

## 2017-11-21 NOTE — Telephone Encounter (Signed)
Received a phone from [place] at [facility] in [place] that patient expired today at 1:55 pm, Dr. Jacklynn Bue made aware.

## 2017-11-21 DEATH — deceased

## 2017-12-11 ENCOUNTER — Encounter: Payer: Self-pay | Admitting: Internal Medicine

## 2017-12-20 ENCOUNTER — Encounter: Payer: Self-pay | Admitting: Internal Medicine
